# Patient Record
Sex: Male | Born: 1950 | Race: White | Hispanic: No
Health system: Southern US, Academic
[De-identification: ages and names within clinical notes are randomized; demographics above are authoritative.]

## PROBLEM LIST (undated history)

## (undated) ENCOUNTER — Encounter

## (undated) ENCOUNTER — Telehealth

## (undated) ENCOUNTER — Encounter
Attending: Student in an Organized Health Care Education/Training Program | Primary: Student in an Organized Health Care Education/Training Program

## (undated) ENCOUNTER — Ambulatory Visit: Payer: MEDICARE

## (undated) ENCOUNTER — Encounter: Attending: Physical Medicine & Rehabilitation | Primary: Physical Medicine & Rehabilitation

## (undated) ENCOUNTER — Encounter: Attending: Internal Medicine | Primary: Internal Medicine

## (undated) ENCOUNTER — Ambulatory Visit

## (undated) ENCOUNTER — Ambulatory Visit
Payer: MEDICARE | Attending: Rehabilitative and Restorative Service Providers" | Primary: Rehabilitative and Restorative Service Providers"

## (undated) ENCOUNTER — Encounter: Attending: Hematology & Oncology | Primary: Hematology & Oncology

## (undated) ENCOUNTER — Encounter: Attending: Anesthesiology | Primary: Anesthesiology

## (undated) ENCOUNTER — Ambulatory Visit: Payer: MEDICARE | Attending: Physical Medicine & Rehabilitation | Primary: Physical Medicine & Rehabilitation

## (undated) ENCOUNTER — Encounter: Attending: Physician Assistant | Primary: Physician Assistant

## (undated) ENCOUNTER — Ambulatory Visit: Payer: MEDICARE | Attending: Internal Medicine | Primary: Internal Medicine

## (undated) ENCOUNTER — Encounter
Attending: Pharmacist Clinician (PhC)/ Clinical Pharmacy Specialist | Primary: Pharmacist Clinician (PhC)/ Clinical Pharmacy Specialist

## (undated) ENCOUNTER — Telehealth
Attending: Pharmacist Clinician (PhC)/ Clinical Pharmacy Specialist | Primary: Pharmacist Clinician (PhC)/ Clinical Pharmacy Specialist

## (undated) ENCOUNTER — Ambulatory Visit
Payer: MEDICARE | Attending: Student in an Organized Health Care Education/Training Program | Primary: Student in an Organized Health Care Education/Training Program

## (undated) ENCOUNTER — Encounter: Attending: Diagnostic Radiology | Primary: Diagnostic Radiology

## (undated) ENCOUNTER — Encounter: Attending: Surgery | Primary: Surgery

## (undated) ENCOUNTER — Ambulatory Visit
Attending: Student in an Organized Health Care Education/Training Program | Primary: Student in an Organized Health Care Education/Training Program

## (undated) ENCOUNTER — Ambulatory Visit: Payer: MEDICARE | Attending: Plastic and Reconstructive Surgery | Primary: Plastic and Reconstructive Surgery

## (undated) ENCOUNTER — Ambulatory Visit: Attending: Internal Medicine | Primary: Internal Medicine

## (undated) ENCOUNTER — Encounter: Attending: Urology | Primary: Urology

## (undated) ENCOUNTER — Ambulatory Visit: Payer: MEDICARE | Attending: Cardiovascular Disease | Primary: Cardiovascular Disease

## (undated) ENCOUNTER — Ambulatory Visit: Payer: MEDICARE | Attending: Audiologist | Primary: Audiologist

## (undated) ENCOUNTER — Ambulatory Visit: Payer: MEDICARE | Attending: Nurse Practitioner | Primary: Nurse Practitioner

## (undated) ENCOUNTER — Ambulatory Visit: Payer: MEDICARE | Attending: Neurology | Primary: Neurology

## (undated) ENCOUNTER — Telehealth: Attending: Urology | Primary: Urology

## (undated) ENCOUNTER — Encounter: Attending: Dermatology | Primary: Dermatology

## (undated) ENCOUNTER — Encounter: Attending: Adult Health | Primary: Adult Health

## (undated) ENCOUNTER — Encounter: Attending: Infectious Disease | Primary: Infectious Disease

## (undated) DIAGNOSIS — I272 Pulmonary hypertension, unspecified: Secondary | ICD-10-CM

## (undated) DIAGNOSIS — B1920 Unspecified viral hepatitis C without hepatic coma: Secondary | ICD-10-CM

## (undated) DIAGNOSIS — K7460 Unspecified cirrhosis of liver: Secondary | ICD-10-CM

## (undated) DIAGNOSIS — C801 Malignant (primary) neoplasm, unspecified: Secondary | ICD-10-CM

## (undated) DIAGNOSIS — I509 Heart failure, unspecified: Secondary | ICD-10-CM

## (undated) DIAGNOSIS — Z944 Liver transplant status: Secondary | ICD-10-CM

## (undated) DIAGNOSIS — I4891 Unspecified atrial fibrillation: Secondary | ICD-10-CM

## (undated) DIAGNOSIS — N189 Chronic kidney disease, unspecified: Secondary | ICD-10-CM

## (undated) DIAGNOSIS — K219 Gastro-esophageal reflux disease without esophagitis: Secondary | ICD-10-CM

## (undated) DIAGNOSIS — E119 Type 2 diabetes mellitus without complications: Secondary | ICD-10-CM

## (undated) DIAGNOSIS — N400 Enlarged prostate without lower urinary tract symptoms: Secondary | ICD-10-CM

## (undated) HISTORY — DX: Heart failure, unspecified: I50.9

## (undated) HISTORY — DX: Chronic kidney disease, unspecified: N18.9

## (undated) HISTORY — PX: LIVER SURGERY: SHX698

## (undated) HISTORY — DX: Unspecified atrial fibrillation: I48.91

## (undated) HISTORY — DX: Pulmonary hypertension, unspecified: I27.20

## (undated) HISTORY — DX: Type 2 diabetes mellitus without complications: E11.9

## (undated) HISTORY — PX: CARDIAC PACEMAKER PLACEMENT: SHX583

## (undated) HISTORY — PX: BACK SURGERY: SHX140

## (undated) HISTORY — DX: Benign prostatic hyperplasia without lower urinary tract symptoms: N40.0

## (undated) HISTORY — DX: Malignant (primary) neoplasm, unspecified: C80.1

## (undated) HISTORY — PX: CLAVICLE SURGERY: SHX598

## (undated) HISTORY — PX: ANKLE FRACTURE SURGERY: SHX122

## (undated) HISTORY — DX: Liver transplant status: Z94.4

## (undated) HISTORY — DX: Gastro-esophageal reflux disease without esophagitis: K21.9

## (undated) HISTORY — DX: Unspecified viral hepatitis C without hepatic coma: B19.20

## (undated) MED ORDER — AMIODARONE 200 MG TABLET: Freq: Every day | ORAL | 0.00000 days

## (undated) MED ORDER — ATORVASTATIN 20 MG TABLET: Freq: Every day | ORAL | 0.00000 days

---
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
## 2004-09-30 ENCOUNTER — Ambulatory Visit: Payer: Self-pay | Admitting: Gastroenterology

## 2004-10-29 ENCOUNTER — Ambulatory Visit (HOSPITAL_COMMUNITY): Admission: RE | Admit: 2004-10-29 | Discharge: 2004-10-29 | Payer: Self-pay | Admitting: Gastroenterology

## 2005-02-14 ENCOUNTER — Ambulatory Visit: Payer: Self-pay | Admitting: Pain Medicine

## 2005-03-15 ENCOUNTER — Ambulatory Visit: Payer: Self-pay | Admitting: Pain Medicine

## 2005-03-30 ENCOUNTER — Ambulatory Visit: Payer: Self-pay | Admitting: Physician Assistant

## 2005-04-07 ENCOUNTER — Ambulatory Visit: Payer: Self-pay | Admitting: Pain Medicine

## 2005-04-22 ENCOUNTER — Ambulatory Visit: Payer: Self-pay | Admitting: Physician Assistant

## 2005-05-09 ENCOUNTER — Ambulatory Visit: Payer: Self-pay | Admitting: Physician Assistant

## 2005-06-09 ENCOUNTER — Ambulatory Visit: Payer: Self-pay | Admitting: Physician Assistant

## 2005-07-11 ENCOUNTER — Ambulatory Visit: Payer: Self-pay | Admitting: Physician Assistant

## 2005-07-14 ENCOUNTER — Ambulatory Visit: Payer: Self-pay | Admitting: Pain Medicine

## 2005-08-10 ENCOUNTER — Ambulatory Visit: Payer: Self-pay | Admitting: Physician Assistant

## 2005-09-09 ENCOUNTER — Ambulatory Visit: Payer: Self-pay | Admitting: Physician Assistant

## 2005-09-27 ENCOUNTER — Ambulatory Visit: Payer: Self-pay | Admitting: Pain Medicine

## 2005-10-12 ENCOUNTER — Ambulatory Visit: Payer: Self-pay | Admitting: Physician Assistant

## 2005-10-13 ENCOUNTER — Ambulatory Visit: Payer: Self-pay | Admitting: Internal Medicine

## 2005-10-19 ENCOUNTER — Ambulatory Visit: Payer: Self-pay | Admitting: Internal Medicine

## 2005-12-09 ENCOUNTER — Ambulatory Visit: Payer: Self-pay | Admitting: Physician Assistant

## 2006-01-06 ENCOUNTER — Ambulatory Visit: Payer: Self-pay | Admitting: Physician Assistant

## 2006-02-06 ENCOUNTER — Ambulatory Visit: Payer: Self-pay | Admitting: Physician Assistant

## 2006-02-07 ENCOUNTER — Ambulatory Visit: Payer: Self-pay | Admitting: Pain Medicine

## 2006-02-20 ENCOUNTER — Ambulatory Visit: Payer: Self-pay | Admitting: Pain Medicine

## 2006-02-24 ENCOUNTER — Ambulatory Visit: Payer: Self-pay | Admitting: Physician Assistant

## 2006-04-06 ENCOUNTER — Ambulatory Visit: Payer: Self-pay | Admitting: Physician Assistant

## 2006-05-05 ENCOUNTER — Ambulatory Visit: Payer: Self-pay | Admitting: Physician Assistant

## 2006-06-05 ENCOUNTER — Ambulatory Visit: Payer: Self-pay | Admitting: Physician Assistant

## 2006-07-04 ENCOUNTER — Ambulatory Visit: Payer: Self-pay | Admitting: Physician Assistant

## 2006-08-03 ENCOUNTER — Ambulatory Visit: Payer: Self-pay | Admitting: Physician Assistant

## 2006-09-01 ENCOUNTER — Ambulatory Visit: Payer: Self-pay | Admitting: Physician Assistant

## 2006-09-26 ENCOUNTER — Ambulatory Visit: Payer: Self-pay | Admitting: Pain Medicine

## 2006-10-26 ENCOUNTER — Ambulatory Visit: Payer: Self-pay | Admitting: Physician Assistant

## 2006-12-01 ENCOUNTER — Ambulatory Visit: Payer: Self-pay | Admitting: Physician Assistant

## 2007-01-01 ENCOUNTER — Ambulatory Visit: Payer: Self-pay | Admitting: Physician Assistant

## 2007-01-16 ENCOUNTER — Ambulatory Visit: Payer: Self-pay | Admitting: Physician Assistant

## 2007-01-22 ENCOUNTER — Ambulatory Visit: Payer: Self-pay | Admitting: Urology

## 2007-03-01 ENCOUNTER — Ambulatory Visit: Payer: Self-pay | Admitting: Physician Assistant

## 2007-03-20 ENCOUNTER — Ambulatory Visit: Payer: Self-pay | Admitting: Pain Medicine

## 2007-03-30 ENCOUNTER — Ambulatory Visit: Payer: Self-pay | Admitting: Physician Assistant

## 2007-05-03 ENCOUNTER — Ambulatory Visit: Payer: Self-pay | Admitting: Physician Assistant

## 2007-05-31 ENCOUNTER — Ambulatory Visit: Payer: Self-pay | Admitting: Physician Assistant

## 2007-06-27 ENCOUNTER — Ambulatory Visit: Payer: Self-pay | Admitting: Physician Assistant

## 2007-07-03 ENCOUNTER — Encounter: Payer: Self-pay | Admitting: Pain Medicine

## 2007-07-13 ENCOUNTER — Encounter: Payer: Self-pay | Admitting: Pain Medicine

## 2007-07-31 ENCOUNTER — Ambulatory Visit: Payer: Self-pay | Admitting: Physician Assistant

## 2007-08-30 ENCOUNTER — Ambulatory Visit: Payer: Self-pay | Admitting: Physician Assistant

## 2007-09-27 ENCOUNTER — Ambulatory Visit: Payer: Self-pay | Admitting: Physician Assistant

## 2007-10-25 ENCOUNTER — Ambulatory Visit: Payer: Self-pay | Admitting: Physician Assistant

## 2007-11-28 ENCOUNTER — Ambulatory Visit: Payer: Self-pay | Admitting: Physician Assistant

## 2007-12-04 ENCOUNTER — Ambulatory Visit: Payer: Self-pay | Admitting: Pain Medicine

## 2008-01-01 ENCOUNTER — Ambulatory Visit: Payer: Self-pay | Admitting: Physician Assistant

## 2008-01-30 ENCOUNTER — Ambulatory Visit: Payer: Self-pay | Admitting: Physician Assistant

## 2008-02-28 ENCOUNTER — Ambulatory Visit: Payer: Self-pay | Admitting: Physician Assistant

## 2008-03-26 ENCOUNTER — Ambulatory Visit: Payer: Self-pay | Admitting: Physician Assistant

## 2008-04-30 ENCOUNTER — Ambulatory Visit: Payer: Self-pay | Admitting: Physician Assistant

## 2008-05-28 ENCOUNTER — Ambulatory Visit: Payer: Self-pay | Admitting: Physician Assistant

## 2008-06-25 ENCOUNTER — Ambulatory Visit: Payer: Self-pay | Admitting: Physician Assistant

## 2008-07-29 ENCOUNTER — Ambulatory Visit: Payer: Self-pay | Admitting: Physician Assistant

## 2008-08-26 ENCOUNTER — Ambulatory Visit: Payer: Self-pay | Admitting: Physician Assistant

## 2008-09-16 ENCOUNTER — Ambulatory Visit: Payer: Self-pay | Admitting: Pain Medicine

## 2008-10-14 ENCOUNTER — Ambulatory Visit: Payer: Self-pay | Admitting: Physician Assistant

## 2008-10-28 ENCOUNTER — Ambulatory Visit: Payer: Self-pay | Admitting: Pain Medicine

## 2008-11-11 ENCOUNTER — Ambulatory Visit: Payer: Self-pay | Admitting: Physician Assistant

## 2008-12-11 ENCOUNTER — Ambulatory Visit: Payer: Self-pay | Admitting: Physician Assistant

## 2009-01-13 ENCOUNTER — Ambulatory Visit: Payer: Self-pay | Admitting: Physician Assistant

## 2009-02-10 ENCOUNTER — Ambulatory Visit: Payer: Self-pay | Admitting: Physician Assistant

## 2009-03-12 ENCOUNTER — Ambulatory Visit: Payer: Self-pay | Admitting: Physician Assistant

## 2009-04-06 ENCOUNTER — Ambulatory Visit: Payer: Self-pay | Admitting: Pain Medicine

## 2009-05-13 ENCOUNTER — Ambulatory Visit: Payer: Self-pay | Admitting: Pain Medicine

## 2009-06-03 ENCOUNTER — Other Ambulatory Visit: Payer: Self-pay | Admitting: Pain Medicine

## 2009-06-15 ENCOUNTER — Ambulatory Visit: Payer: Self-pay | Admitting: Pain Medicine

## 2009-07-16 ENCOUNTER — Ambulatory Visit: Payer: Self-pay | Admitting: Pain Medicine

## 2009-08-17 ENCOUNTER — Ambulatory Visit: Payer: Self-pay | Admitting: Pain Medicine

## 2010-07-12 ENCOUNTER — Other Ambulatory Visit: Payer: Self-pay | Admitting: Physician Assistant

## 2012-08-21 ENCOUNTER — Ambulatory Visit: Payer: Self-pay | Admitting: Physical Medicine and Rehabilitation

## 2012-08-21 LAB — CREATININE, SERUM
Creatinine: 1.13 mg/dL (ref 0.60–1.30)
EGFR (African American): >60

## 2013-04-05 ENCOUNTER — Ambulatory Visit: Payer: Self-pay

## 2013-05-14 ENCOUNTER — Ambulatory Visit: Payer: Self-pay | Admitting: Urology

## 2013-06-13 ENCOUNTER — Ambulatory Visit: Payer: Self-pay | Admitting: Urology

## 2014-06-18 ENCOUNTER — Ambulatory Visit
Admit: 2014-06-18 | Disposition: A | Discharge status: Home / Self Care | Payer: Self-pay | Attending: Family Medicine | Admitting: Family Medicine

## 2015-01-26 ENCOUNTER — Ambulatory Visit
Admission: EM | Admit: 2015-01-26 | Discharge: 2015-01-26 | Disposition: A | Discharge status: Home / Self Care | Payer: Medicare Other | Attending: Family Medicine | Admitting: Family Medicine

## 2015-01-26 ENCOUNTER — Ambulatory Visit: Payer: Medicare Other

## 2015-01-26 ENCOUNTER — Encounter: Payer: Self-pay | Admitting: Emergency Medicine

## 2015-01-26 DIAGNOSIS — M549 Dorsalgia, unspecified: Secondary | ICD-10-CM | POA: Diagnosis present

## 2015-01-26 DIAGNOSIS — T148 Other injury of unspecified body region: Secondary | ICD-10-CM

## 2015-01-26 DIAGNOSIS — S20229A Contusion of unspecified back wall of thorax, initial encounter: Secondary | ICD-10-CM | POA: Diagnosis not present

## 2015-01-26 DIAGNOSIS — K7460 Unspecified cirrhosis of liver: Secondary | ICD-10-CM | POA: Insufficient documentation

## 2015-01-26 DIAGNOSIS — R05 Cough: Secondary | ICD-10-CM | POA: Diagnosis not present

## 2015-01-26 DIAGNOSIS — R059 Cough, unspecified: Secondary | ICD-10-CM

## 2015-01-26 DIAGNOSIS — M546 Pain in thoracic spine: Secondary | ICD-10-CM | POA: Diagnosis not present

## 2015-01-26 DIAGNOSIS — Z79899 Other long term (current) drug therapy: Secondary | ICD-10-CM | POA: Insufficient documentation

## 2015-01-26 DIAGNOSIS — W11XXXA Fall on and from ladder, initial encounter: Secondary | ICD-10-CM | POA: Insufficient documentation

## 2015-01-26 DIAGNOSIS — T148XXA Other injury of unspecified body region, initial encounter: Secondary | ICD-10-CM

## 2015-01-26 HISTORY — DX: Unspecified cirrhosis of liver: K74.60

## 2015-01-26 LAB — URINALYSIS COMPLETE WITH MICROSCOPIC (ARMC ONLY)
BACTERIA UA: NONE SEEN — AB
BILIRUBIN URINE: NEGATIVE
GLUCOSE, UA: NEGATIVE mg/dL
Ketones, ur: NEGATIVE mg/dL
Leukocytes, UA: NEGATIVE
NITRITE: NEGATIVE
Protein, ur: NEGATIVE mg/dL
SQUAMOUS EPITHELIAL / LPF: NONE SEEN — AB
Specific Gravity, Urine: 1.02 (ref 1.005–1.030)
WBC, UA: NONE SEEN WBC/hpf (ref <3)
pH: 7 (ref 5.0–8.0)

## 2015-01-26 MED ORDER — ACETAMINOPHEN 500 MG PO TABS: 1000.0000 mg | ORAL_TABLET | Freq: Four times a day (QID) | ORAL | Status: AC | PRN

## 2015-01-26 NOTE — ED Notes (Signed)
Patient states that on last Wed he tripped and fell at his home.  Patient c/o mid back pain.

## 2015-01-26 NOTE — Discharge Instructions (Signed)
Pulmonary Contusion A pulmonary contusion is a deep bruise to the tissues of the lung. The lungs bring oxygen into the bloodstream, and they remove carbon dioxide that the body cannot use. A pulmonary contusion causes the lung tissue to swell and bleed into the surrounding area. This interferes with the ability of the lungs to function. You may feel short of breath because you are not getting enough oxygen. CAUSES This condition is usually caused by a chest injury, such as an injury from:  A car crash.  A severe fall, especially from a great height.  Being near an explosion.  A sports injury.  A crush injury, such as from industrial or farming machinery.  A physical assault, especially if struck in the chest with a blunt object.  A penetrating injury. SYMPTOMS Symptoms of this condition include:  Shortness of breath.  Fast breathing.  Fast heart rate.  Bruising of the chest.  Chest pain, especially when taking a deep breath.  Coughing.  Spitting up blood. When the pulmonary contusion is severe, the symptoms may get worse over the first 24-48 hours. More serious symptoms may include:  Blueness of the lips or nail beds.  Sweating.  Feeling faint or dizzy.  Feeling confused.  Fever.  An inability to breathe without assistance or extra oxygen. DIAGNOSIS This condition may be diagnosed with:  A physical exam. During the exam, your health care provider may observe your breathing and check your chest for bruised or swollen areas. People who have a pulmonary contusion need to be checked carefully because they may have other injuries.  Imaging tests, such as:  Chest X-rays. An X-ray might not show evidence of a pulmonary contusion for 1-2 days after the injury, so chest X-rays may be repeated several times.  A CT scan.  MRI.  Pulse oximetry. For this test, a sensor is put on the finger to measure heart rate and the amount of oxygen in the bloodstream.  Arterial  blood gases test. This is a blood test that measures the amount of oxygen, carbon dioxide, and acid in the blood. TREATMENT Treatment for this condition may include:  Pain medicine.  Fluids through an IV tube that is inserted into one of your veins.  Deep breathing exercises. These can help to avoid pneumonia. You may be asked to use a device that is called an incentive spirometer to perform deep breathing exercises.  Oxygen therapy. This may be needed if you have difficulty breathing and have low blood oxygen. In severe cases, you may need to have a tube placed in your throat and a have a machine (ventilator) to help with breathing.  Surgery if a blood vessel continues to bleed uncontrollably or if the lung has been punctured. Initial treatment for this condition is often given in an emergency department. HOME CARE INSTRUCTIONS  Take over-the-counter and prescription medicines only as told by your health care provider. Do not take aspirin for the first few days, because this may increase bruising.  Continue to do deep breathing exercises. Use an incentive spirometer for deep breathing exercises as told by your health care provider.  Return to your normal activities only as told by your health care provider. Talk with your health care provider about:  What activities are safe for you.  When you can return to driving, work, school, and sports.  Keep all follow-up visits as told by your health care provider. This is important. SEEK MEDICAL CARE IF:  You have shaking chills.  You  cough up mucus. SEEK IMMEDIATE MEDICAL CARE IF:  You have difficulty breathing, and it is getting worse.  Your chest pain gets worse.  You cough up blood.  You start to wheeze.  Your lips or nail beds appear blue.  You feel dizzy or feel like you may faint.  You feel confused.  You have a fever.   This information is not intended to replace advice given to you by your health care provider. Make  sure you discuss any questions you have with your health care provider.   Document Released: 12/08/2007 Document Revised: 11/19/2014 Document Reviewed: 04/07/2014 Elsevier Interactive Patient Education 2016 [place]. Chest Contusion A chest contusion is a deep bruise on your chest area. Contusions are the result of an injury that caused bleeding under the skin. A chest contusion may involve bruising of the skin, muscles, or ribs. The contusion may turn blue, purple, or yellow. Minor injuries will give you a painless contusion, but more severe contusions may stay painful and swollen for a few weeks. CAUSES  A contusion is usually caused by a blow, trauma, or direct force to an area of the body. SYMPTOMS   Swelling and redness of the injured area.  Discoloration of the injured area.  Tenderness and soreness of the injured area.  Pain. DIAGNOSIS  The diagnosis can be made by taking a history and performing a physical exam. An X-ray, CT scan, or MRI may be needed to determine if there were any associated injuries, such as broken bones (fractures) or internal injuries. TREATMENT  Often, the best treatment for a chest contusion is resting, icing, and applying cold compresses to the injured area. Deep breathing exercises may be recommended to reduce the risk of pneumonia. Over-the-counter medicines may also be recommended for pain control. HOME CARE INSTRUCTIONS   Put ice on the injured area.  Put ice in a plastic bag.  Place a towel between your skin and the bag.  Leave the ice on for 15-20 minutes, 03-04 times a day.  Only take over-the-counter or prescription medicines as directed by your caregiver. Your caregiver may recommend avoiding anti-inflammatory medicines (aspirin, ibuprofen, and naproxen) for 48 hours because these medicines may increase bruising.  Rest the injured area.  Perform deep-breathing exercises as directed by your caregiver.  Stop smoking if you smoke.  Do  not lift objects over 5 pounds (2.3 kg) for 3 days or longer if recommended by your caregiver. SEEK IMMEDIATE MEDICAL CARE IF:   You have increased bruising or swelling.  You have pain that is getting worse.  You have difficulty breathing.  You have dizziness, weakness, or fainting.  You have blood in your urine or stool.  You cough up or vomit blood.  Your swelling or pain is not relieved with medicines. MAKE SURE YOU:   Understand these instructions.  Will watch your condition.  Will get help right away if you are not doing well or get worse.   This information is not intended to replace advice given to you by your health care provider. Make sure you discuss any questions you have with your health care provider.   Document Released: 11/23/2000 Document Revised: 11/23/2011 Document Reviewed: 08/22/2011 Elsevier Interactive Patient Education [employer].

## 2015-01-27 NOTE — ED Provider Notes (Signed)
CSN: [id number]     Arrival date & time 01/26/15  1331 History   First MD Initiated Contact with Patient 01/26/15 1623     Chief Complaint  Patient presents with  . Fall  . Back Pain   (Consider location/radiation/quality/duration/timing/severity/associated sxs/prior Treatment) HPI Comments: Married caucasian male here for evaluation or rib/back pain fell from ladder last week Wednesday bruised and has productive green cough. Worsening pain lying on his back  Denied sick contacts  Has tried heat and ice without any relief.  Pain with deep breaths Denied being sick, loss of bowel/bladder control, saddle paresthesias, arm/leg weakness, vomiting, headache, LOC, seizures, chest pain (angina)  PMHx diabetes, hypertension, low vitamin D  PSHx broken ankle, back in 3 places--rods placed  FHx broken bones  Patient is a 64 y.o. male presenting with fall and back pain. The history is provided by the patient.  Fall This is a new problem. The current episode started more than 2 days ago. The problem occurs rarely. The problem has not changed since onset.Associated symptoms include chest pain. Pertinent negatives include no abdominal pain, no headaches and no shortness of breath. The symptoms are aggravated by coughing, exertion, standing, bending and walking. Nothing relieves the symptoms. He has tried rest, food, water, a cold compress and a warm compress for the symptoms. The treatment provided no relief.  Back Pain Associated symptoms: chest pain   Associated symptoms: no abdominal pain, no dysuria, no fever, no headaches, no numbness and no weakness     Past Medical History  Diagnosis Date  . Cirrhosis [facility])    Past Surgical History  Procedure Laterality Date  . Ankle fracture surgery Bilateral   . Back surgery     History reviewed. No pertinent family history. Social History  Substance Use Topics  . Smoking status: Never Smoker   . Smokeless tobacco: None  . Alcohol Use: No    Review of  Systems  Constitutional: Negative for fever, chills, diaphoresis, activity change, appetite change, fatigue and unexpected weight change.  HENT: Negative for congestion, dental problem, drooling, ear discharge, ear pain, facial swelling, hearing loss, mouth sores, nosebleeds, postnasal drip, rhinorrhea, sinus pressure, sneezing, sore throat, tinnitus, trouble swallowing and voice change.   Eyes: Negative for photophobia, pain, discharge, redness, itching and visual disturbance.  Respiratory: Positive for cough. Negative for choking, chest tightness, shortness of breath, wheezing and stridor.   Cardiovascular: Positive for chest pain. Negative for palpitations and leg swelling.  Gastrointestinal: Negative for nausea, vomiting, abdominal pain, diarrhea, constipation, blood in stool and abdominal distention.  Endocrine: Negative for cold intolerance and heat intolerance.  Genitourinary: Positive for hematuria and flank pain. Negative for dysuria, discharge, penile swelling, scrotal swelling, enuresis, difficulty urinating, genital sores, penile pain and testicular pain.  Musculoskeletal: Positive for myalgias and back pain. Negative for joint swelling, arthralgias, gait problem, neck pain and neck stiffness.  Skin: Positive for color change. Negative for pallor, rash and wound.  Allergic/Immunologic: Negative for environmental allergies and food allergies.  Neurological: Negative for dizziness, tremors, seizures, syncope, facial asymmetry, speech difficulty, weakness, light-headedness, numbness and headaches.  Hematological: Negative for adenopathy. Does not bruise/bleed easily.  Psychiatric/Behavioral: Positive for sleep disturbance. Negative for confusion and agitation. The patient is not nervous/anxious.     Allergies  Review of patient's allergies indicates no known allergies.  Home Medications   Prior to Admission medications   Medication Sig Start Date End Date Taking? Authorizing Provider    cholecalciferol (VITAMIN D) 1000 UNITS tablet  Take 1,000 Units by mouth daily.   Yes Historical Provider, MD  furosemide (LASIX) 20 MG tablet Take 20 mg by mouth.   Yes Historical Provider, MD  Multiple Vitamin (MULTIVITAMIN) tablet Take 1 tablet by mouth daily.   Yes Historical Provider, MD  spironolactone (ALDACTONE) 25 MG tablet Take 25 mg by mouth daily.   Yes Historical Provider, MD  acetaminophen (TYLENOL) 500 MG tablet Take 2 tablets (1,000 mg total) by mouth every 6 (six) hours as needed for mild pain or moderate pain. 01/26/15 02/01/15  Olen Cordial, NP  ciprofloxacin (CIPRO) 500 MG tablet Take 1 tablet (500 mg total) by mouth 2 (two) times daily. 01/28/15   Olen Cordial, NP   Meds Ordered and Administered this Visit  Medications - No data to display  BP 134/60 mmHg  Pulse 68  Temp(Src) 98.1 F (36.7 C) (Tympanic)  Resp 16  Ht 5\' 9"  (1.753 m)  Wt 183 lb (83.008 kg)  BMI 27.01 kg/m2  SpO2 100% No data found.   Physical Exam  Constitutional: He is oriented to person, place, and time. Vital signs are normal. He appears well-developed and well-nourished. He is active and cooperative.  Non-toxic appearance. He does not have a sickly appearance. He does not appear ill. No distress.  HENT:  Head: Normocephalic and atraumatic.  Right Ear: External ear and ear canal normal. A middle ear effusion is present.  Left Ear: Hearing, external ear and ear canal normal. A middle ear effusion is present.  Nose: Mucosal edema and rhinorrhea present. No nose lacerations, sinus tenderness, nasal deformity, septal deviation or nasal septal hematoma. No epistaxis.  No foreign bodies. Right sinus exhibits no maxillary sinus tenderness and no frontal sinus tenderness. Left sinus exhibits no maxillary sinus tenderness and no frontal sinus tenderness.  Mouth/Throat: Uvula is midline and mucous membranes are normal. Mucous membranes are not pale, not dry and not cyanotic. He does not have  dentures. No oral lesions. No trismus in the jaw. Normal dentition. No dental abscesses, uvula swelling, lacerations or dental caries. Posterior oropharyngeal edema and posterior oropharyngeal erythema present. No oropharyngeal exudate or tonsillar abscesses.  Cobblestoning posterior pharynx; bilateral TMs with air fluid level; bilateral nasal turbinates with edema/erythema  Eyes: Conjunctivae, EOM and lids are normal. Pupils are equal, round, and reactive to light. Right eye exhibits no chemosis, no discharge, no exudate and no hordeolum. No foreign body present in the right eye. Left eye exhibits no chemosis, no discharge, no exudate and no hordeolum. No foreign body present in the left eye. Right conjunctiva is not injected. Right conjunctiva has no hemorrhage. Left conjunctiva is not injected. Left conjunctiva has no hemorrhage. No scleral icterus. Right eye exhibits normal extraocular motion and no nystagmus. Left eye exhibits normal extraocular motion and no nystagmus. Right pupil is round and reactive. Left pupil is round and reactive. Pupils are equal.  Neck: Trachea normal and normal range of motion. Neck supple. No tracheal tenderness, no spinous process tenderness and no muscular tenderness present. No rigidity. No tracheal deviation, no edema, no erythema and normal range of motion present. No thyroid mass and no thyromegaly present.  Cardiovascular: Normal rate, regular rhythm, S1 normal, S2 normal, normal heart sounds and intact distal pulses.  PMI is not displaced.  Exam reveals no gallop and no friction rub.   No murmur heard. Pulmonary/Chest: Effort normal and breath sounds normal. No accessory muscle usage or stridor. No respiratory distress. He has no decreased breath sounds. He has  no wheezes. He has no rhonchi. He has no rales.   He exhibits tenderness.  Negative egophany all fields  Abdominal: Soft. Bowel sounds are normal. He exhibits no shifting dullness, no distension, no pulsatile  liver, no fluid wave, no abdominal bruit, no ascites, no pulsatile midline mass and no mass. There is no hepatosplenomegaly. There is no tenderness. There is no rigidity, no rebound, no guarding, no tenderness at McBurney's point and negative Murphy's sign. Hernia confirmed negative in the ventral area.  Dull to percussion x 4 quads  Musculoskeletal: Normal range of motion. He exhibits no edema.       Right shoulder: Normal.       Left shoulder: Normal.       Right elbow: Normal.      Left elbow: Normal.       Right wrist: Normal.       Left wrist: Normal.       Right hip: Normal.       Left hip: Normal.       Right knee: Normal.       Left knee: Normal.       Cervical back: Normal.       Thoracic back: He exhibits tenderness, bony tenderness and pain.       Lumbar back: Normal.       Back:       Right upper arm: Normal.       Left upper arm: Normal.       Right forearm: Normal.       Left forearm: Normal.       Right hand: Normal.       Left hand: Normal.  Right thoracic ribs and intercostals and paraspinals TTP no spasms noted or edema only ecchmosis right flank/ribs  Lymphadenopathy:       Head (right side): No submental, no submandibular, no tonsillar, no preauricular, no posterior auricular and no occipital adenopathy present.       Head (left side): No submental, no submandibular, no tonsillar, no preauricular, no posterior auricular and no occipital adenopathy present.    He has no cervical adenopathy.       Right cervical: No superficial cervical, no deep cervical and no posterior cervical adenopathy present.      Left cervical: No superficial cervical, no deep cervical and no posterior cervical adenopathy present.  Neurological: He is alert and oriented to person, place, and time. He displays no atrophy and no tremor. No cranial nerve deficit or sensory deficit. He exhibits normal muscle tone. He displays no seizure activity. Coordination and gait normal. GCS eye subscore is  4. GCS verbal subscore is 5. GCS motor subscore is 6.  Reflex Scores:      Patellar reflexes are 2+ on the right side and 2+ on the left side.      Achilles reflexes are 2+ on the right side and 2+ on the left side. Skin: Skin is warm, dry and intact. Bruising and ecchymosis noted. No abrasion, no burn, no laceration, no lesion, no petechiae and no rash noted. He is not diaphoretic. No cyanosis or erythema. No pallor. Nails show no clubbing.  Psychiatric: He has a normal mood and affect. His speech is normal and behavior is normal. Judgment and thought content normal. Cognition and memory are normal.  Nursing note and vitals reviewed.   ED Course  Procedures (including critical care time)  Labs Review Labs Reviewed  URINALYSIS COMPLETEWITH MICROSCOPIC ([place]) - Abnormal; Notable for the following:  APPearance CLOUDY (*)    Hgb urine dipstick TRACE (*)    Bacteria, UA NONE SEEN (*)    Squamous Epithelial / LPF NONE SEEN (*)    All other components within normal limits  URINE CULTURE  16 Nov at 1600 patient notified via telephone proteus mirabalis 20000 on urine culture.  This bacteria can cause urinary stones also.  Sensitivities pending.  Discussed with patient if symptoms not improving to follow up for re-evaluation after 48 hours on antibiotics unless not able to void every 8 hours or gross hematuria follow up immediately. patient symptoms worsening back pain since appt denied fever, chills, nausea, vomiting, headache, dysuria.  will treat with ciprofloxacin discussed possible side effects including hyperglycemia due to interaction of cipro and lasix.  Bactrim and aldactone possible hyperkalemia epocrates recommends avoiding/use alternative to bactrim Rx  cipro 500mg  po BID x 10 days sent to [facility] per patient request  Patient verbalized understanding of information/instructions, agreed with plan of care and had no further questions at this time.  Imaging Review Dg Chest 2  View  01/26/2015  CLINICAL DATA:  Productive cough. Blunt trauma to the right side of the chest in right flank. Bruising. EXAM: CHEST  2 VIEW COMPARISON:  06/18/2014 FINDINGS: Heart size and pulmonary vascularity are normal and the lungs are clear. No effusions. No acute osseous abnormality. Harrington rods in the thoracolumbar spine. IMPRESSION: No acute abnormalities. Electronically Signed   By: Lorriane Shire M.D.   On: 01/26/2015 16:56   1700 discussed chest xray negative for fracture or fluid in lungs or pneumothorax.  definite contusion to back/ribs continue tylenol 1000mg  po QID prn pain, ice, rest, avoid heavy lifting.  Patient notified radiology technician he had had  Hematuria sipping water awaiting urine sample.  Patient given copy of xray report told to follow up if worsening cough, hemoptysis, fever, worsening pain.  1730 discussed urinalysis results with patient no bacteria seen microscopic hematuria could be from fall or another source e.g. Urethritis, prostatitis, kidney stone to follow up for repeat urinalysis with PCM in 1 week sooner if gross hematuria, cloudy urine, changes in smell unable to void every 8 hours.  Patient given copy of report.  Discussed urine culture pending will call with results once available typically 48 hours.  Patient verbalized understanding of information/instructions, agreed with plan of care and had no further questions at this time.   MDM   1. Contusion   2. Cough   3. Right-sided thoracic back pain    Exitcare handout on pulmonary and chest contusion given to patient.   Rest, tylenol 1000mg  po QID prn.  Medications as directed.  Call or return to clinic as needed if these symptoms worsen or fail to improve as anticipated and will consider further imaging.  Patient verbalized agreement and understanding of treatment plan and had no further questions at this time.  P2:  ROM, injury prevention  Bronchitis simple, community acquired, may have started as  viral (probably respiratory syncytial, parainfluenza, influenza, or adenovirus), but now evidence of acute purulent bronchitis with resultant bronchial edema and mucus formation.  Viruses are the most common cause of bronchial inflammation in otherwise healthy adults with acute bronchitis.  The appearance of sputum is not predictive of whether a bacterial infection is present.  Purulent sputum is most often caused by viral infections.  There are a small portion of those caused by non-viral agents being Mycoplamsa pneumonia.  Microscopic examination or C&S of sputum in the healthy  adult with acute bronchitis is generally not helpful (usually negative or normal respiratory flora) other considerations being cough from upper respiratory tract infections, sinusitis or allergic syndromes (mild asthma or viral pneumonia).  Differential Diagnosis:  reactive airway disease (asthma, allergic aspergillosis (eosinophilia), chronic bronchitis, respiratory infection (Sinusitis, Common cold, pneumonia), congestive heart failure, reflux esophagitis, bronchogenic tumor, aspiration syndromes and/or exposure irritants/tobacco smoke.  In this case, there is no evidence of any invasive bacterial illness.  Most likely viral etiology so will hold on antibiotic treatment.  Advise supportive care with rest, encourage fluids, good hygiene and watch for any worsening symptoms.  If they were to develop:  come back to the office or go to the emergency room if after hours. Without high fever, severe dyspnea, lack of physical findings or other risk factors, I will hold on  CBC at this time.  Patient refused Rx for tessalon pearles or narcotic cough medications at this time.  Discussed honey with lemon, humidifier, robitussin or delsym OTC use.  I discussed that approximately 50% of patients with acute bronchitis have a cough that lasts up to three weeks, and 25% for over a month.  Tylenol, one to two tablets every four hours as needed for fever  or myalgias.   No aspirin.  Patient instructed to follow up in one week or sooner if symptoms worsen. Patient verbalized agreement and understanding of treatment plan.  P2:  hand washing and cover coughFor acute pain, rest, and intermittent application of heat (do not sleep on heating pad).  I discussed longer-term treatment plan of PRN PO NSAIDS tylenol 1000mg  po QID prn and I discussed a home back care exercise program with a strengthening and flexibility exercise.  Proper avoidance of heavy lifting discussed.  Consider physical therapy or chiropractic care and radiology if not improving.  Call or return to clinic as needed if these symptoms worsen or fail to improve as anticipated especially leg weakness, loss of bowel/bladder control or saddle paresthesias.   Patient verbalized understanding of instructions/information and agreed with plan of care.  Microscopic hematuria cystitis versus prostatitis versus kidney contusion versus dehydration.  Patient is to push fluids.  Make appt with [facility] for follow up urinalysis in 1-2 weeks.  Will call with urine culture results once available. Call or return to clinic as needed if these symptoms worsen or fail to improve as anticipated.  Exitcare handout on microscopic hematuria given to patient Patient verbalized agreement and understanding of treatment plan and had no further questions at this time. P2:  [place], NP 01/27/15 2039  Olen Cordial, NP 01/28/15 1606

## 2015-01-28 MED ORDER — CIPROFLOXACIN HCL 500 MG PO TABS: 500.0000 mg | ORAL_TABLET | Freq: Two times a day (BID) | ORAL | Status: DC

## 2015-01-29 LAB — URINE CULTURE

## 2015-01-29 NOTE — ED Notes (Signed)
Final report of urine C&S positive for UTI, sensitive to Rx provided day of visit

## 2016-06-27 ENCOUNTER — Ambulatory Visit: Payer: Medicare Other | Admitting: *Deleted

## 2016-06-30 ENCOUNTER — Encounter: Payer: Medicare Other | Attending: Internal Medicine | Admitting: *Deleted

## 2016-06-30 ENCOUNTER — Encounter: Payer: Self-pay | Admitting: *Deleted

## 2016-06-30 VITALS — BP 114/62 | Ht 68.0 in | Wt 159.2 lb

## 2016-06-30 DIAGNOSIS — E119 Type 2 diabetes mellitus without complications: Secondary | ICD-10-CM | POA: Diagnosis not present

## 2016-06-30 DIAGNOSIS — E1165 Type 2 diabetes mellitus with hyperglycemia: Secondary | ICD-10-CM

## 2016-06-30 DIAGNOSIS — Z713 Dietary counseling and surveillance: Secondary | ICD-10-CM | POA: Insufficient documentation

## 2016-06-30 NOTE — Patient Instructions (Addendum)
Check blood sugars 2 x day before breakfast and 2 hrs after supper every day  Exercise: Walk as tolerated  Eat 3 meals day,  1-2  snacks a day Space meals 4-6 hours apart Complete 3 Day Food Record and bring to next appt  Bring blood sugar records or glucometer to the next appointment  Carry fast acting glucose and a snack at all times Rotate injection sites  Return for appointment on: Monday Aug 01, 2016 at 1:30 pm with [facility] (dietitian)

## 2016-06-30 NOTE — Progress Notes (Signed)
Diabetes Self-Management Education  Visit Type: First/Initial  Appt. Start Time: 1340 Appt. End Time: 1610  06/30/2016  Mr. Hector Mueller, identified by name and date of birth, is a 66 y.o. male with a diagnosis of Diabetes: Type 2.   ASSESSMENT  Blood pressure 114/62, height 5\' 8"  (1.727 m), weight 159 lb 3.2 oz (72.2 kg). Body mass index is 24.21 kg/m.      Diabetes Self-Management Education - 06/30/16 1501      Visit Information   Visit Type First/Initial     Initial Visit   Diabetes Type Type 2   Are you currently following a meal plan? No   Are you taking your medications as prescribed? Yes   Date Diagnosed March 2018     Health Coping   How would you rate your overall health? Good     Psychosocial Assessment   Patient Belief/Attitude about Diabetes Other (comment)  "i don't like it"   Self-care barriers None   Self-management support Doctor's office;Family   Other persons present Spouse/SO   Patient Concerns Nutrition/Meal planning;Glycemic Control;Monitoring;Healthy Lifestyle;Problem Solving   Special Needs None   Preferred Learning Style Hands on   Learning Readiness Ready   How often do you need to have someone help you when you read instructions, pamphlets, or other written materials from your doctor or pharmacy? 1 - Never   What is the last grade level you completed in school? 2 years college     Pre-Education Assessment   Patient understands the diabetes disease and treatment process. Needs Instruction   Patient understands incorporating nutritional management into lifestyle. Needs Instruction   Patient undertands incorporating physical activity into lifestyle. Needs Instruction   Patient understands using medications safely. Needs Instruction   Patient understands monitoring blood glucose, interpreting and using results Needs Review   Patient understands prevention, detection, and treatment of acute complications. Needs Instruction   Patient  understands prevention, detection, and treatment of chronic complications. Needs Instruction   Patient understands how to develop strategies to address psychosocial issues. Needs Instruction   Patient understands how to develop strategies to promote health/change behavior. Needs Instruction     Complications   Last HgB A1C per patient/outside source 8.8 %  05/12/16   How often do you check your blood sugar? 1-2 times/day   Fasting Blood glucose range (mg/dL) >200; pt reports all blood sugars range from 200's - 400's   Postprandial Blood glucose range (mg/dL) >200   Have you had a dilated eye exam in the past 12 months? Yes   Have you had a dental exam in the past 12 months? Yes   Are you checking your feet? No     Dietary Intake   Breakfast egg, bacon, cheese; breakfast burrito (frozen)   Lunch ham and cheese sandwich; burger; noodles;    Dinner tenderloin, hamburger steak, peas, corn, salad, potatoes, beans   Snack (evening) peanut butter   Beverage(s) water, unsweetened tea, diet soda     Exercise   Exercise Type ADL's     Patient Education   Previous Diabetes Education No   Disease state  Definition of diabetes, type 1 and 2, and the diagnosis of diabetes   Nutrition management  Role of diet in the treatment of diabetes and the relationship between the three main macronutrients and blood glucose level   Physical activity and exercise  Helped patient identify appropriate exercises in relation to his/her diabetes, diabetes complications and other health issue.   Medications Taught/reviewed insulin  injection, site rotation, insulin storage and needle disposal.;Reviewed patients medication for diabetes, action, purpose, timing of dose and side effects.   Monitoring Purpose and frequency of SMBG.;Taught/discussed recording of test results and interpretation of SMBG.;Identified appropriate SMBG and/or A1C goals.   Acute complications Taught treatment of hypoglycemia - the 15 rule.    Chronic complications Relationship between chronic complications and blood glucose control   Psychosocial adjustment Identified and addressed patients feelings and concerns about diabetes;Role of stress on diabetes     Individualized Goals (developed by patient)   Reducing Risk Improve blood sugars Prevent diabetes complications Lead a healthier lifestyle Become more fit     Outcomes   Expected Outcomes Demonstrated interest in learning. Expect positive outcomes   Future DMSE 4-6 wks      Individualized Plan for Diabetes Self-Management Training:   Learning Objective:  Patient will have a greater understanding of diabetes self-management. Patient education plan is to attend individual and/or group sessions per assessed needs and concerns.   Plan:   Patient Instructions  Check blood sugars 2 x day before breakfast and 2 hrs after supper every day Exercise: Walk as tolerated Eat 3 meals day,  1-2  snacks a day Space meals 4-6 hours apart Complete 3 Day Food Record and bring to next appt Bring blood sugar records or glucometer to the next appointment Carry fast acting glucose and a snack at all times Rotate injection sites Return for appointment on: Monday Aug 01, 2016 at 1:30 pm with Pam (dietitian)  Expected Outcomes:  Demonstrated interest in learning. Expect positive outcomes  Education material provided:  General Meal Planning Guidelines Simple Meal Plan 3 Day Food Record Glucose tablets Symptoms, causes and treatments of Hypoglycemia  If problems or questions, patient to contact team via:  Johny Drilling, [place], [place], CDE [phone]  Future DSME appointment: 4-6 wks  Aug 01, 2016 with the dietitian

## 2016-08-01 ENCOUNTER — Encounter: Payer: Self-pay | Admitting: Dietician

## 2016-08-01 ENCOUNTER — Encounter: Payer: Medicare Other | Attending: Internal Medicine | Admitting: Dietician

## 2016-08-01 VITALS — BP 116/64 | Ht 68.0 in | Wt 157.1 lb

## 2016-08-01 DIAGNOSIS — Z713 Dietary counseling and surveillance: Secondary | ICD-10-CM | POA: Insufficient documentation

## 2016-08-01 DIAGNOSIS — E119 Type 2 diabetes mellitus without complications: Secondary | ICD-10-CM | POA: Insufficient documentation

## 2016-08-01 DIAGNOSIS — E1165 Type 2 diabetes mellitus with hyperglycemia: Secondary | ICD-10-CM

## 2016-08-01 NOTE — Progress Notes (Signed)
Diabetes Self-Management Education  Visit Type:  Follow-up  Appt. Start Time: 1330 Appt. End Time: 1430  08/01/2016  Mr. Hector Mueller, identified by name and date of birth, is a 66 y.o. male with a diagnosis of Diabetes:  .   ASSESSMENT  Blood pressure 116/64, height 5\' 8"  (1.727 m), weight 157 lb 1.6 oz (71.3 kg). Body mass index is 23.89 kg/m.       Diabetes Self-Management Education - 57/32/20 2542      Complications   How often do you check your blood sugar? 1-2 times/day   Fasting Blood glucose range (mg/dL) >200  230s-240s   Postprandial Blood glucose range (mg/dL) >200  310s   Have you had a dilated eye exam in the past 12 months? Yes   Have you had a dental exam in the past 12 months? Yes   Are you checking your feet? Yes   How many days per week are you checking your feet? 3  wife checks daily     Dietary Intake   Breakfast toast, bacon; today 2 eggs. Occasionally oatmeal, doesn't like plain   Lunch sometimes small amount; sandwich and fruit; ribs and green beans and potatoes (boiled), slaw, corn   Snack (afternoon) occasionally nuts, berries   Dinner steak with fries; pork chop or tenderloin; loves fish.    Snack (evening) peanut butter crackers occasionally    Beverage(s) mostly water     Exercise   Exercise Type ADL's     Patient Education   Nutrition management  Other (comment)  nutrition with poor appetite related to liver cancer   Physical activity and exercise  Other (comment)  role of light exercise in promoting appetite and energy     Post-Education Assessment   Patient understands the diabetes disease and treatment process. Needs Review   Patient understands incorporating nutritional management into lifestyle. Needs Review  when appetite improves   Patient undertands incorporating physical activity into lifestyle. Demonstrates understanding / competency   Patient understands using medications safely. Demonstrates understanding / competency   Patient understands monitoring blood glucose, interpreting and using results Demonstrates understanding / competency   Patient understands prevention, detection, and treatment of acute complications. Needs Review   Patient understands prevention, detection, and treatment of chronic complications. Needs Review   Patient understands how to develop strategies to address psychosocial issues. Needs Review   Patient understands how to develop strategies to promote health/change behavior. Demonstrates understanding / competency     Outcomes   Program Status Completed      Learning Objective:  Patient will have a greater understanding of diabetes self-management. Patient education plan is to attend individual and/or group sessions per assessed needs and concerns.  Hector Mueller continues to lose weight gradually, and reports poor appetite. He does eat 3 meals daily and occasional snacks. Instructed him and his wife on strategies for promoting appetite, and advised him to eat small amounts of food often during the day. Discussed snack options; encouraged them to keep snacks as convenient as possible. Discussed beverage options for added nutrition.    Plan:   Patient Instructions   Eat small amounts of food every 3-4 hours; avoid going longer than 5 hours without anything to eat.   Try 1-2 servings of a nutrition or breakfast drink every day: Glucerna, Boost, or Carnation breakfast drink.   Try having foods that are cool or cold, and soft. Often they are easier to eat when you don't have much appetite.  Combine fruit with  a protein food for balanced nutrition -- cheese, nuts, peanut butter.   Keep some snack foods convenient to make it easier to eat; some fruit and nut or crackers on a nearby table.     Expected Outcomes:  Demonstrated interest in learning. Expect positive outcomes  Education material provided: Nutrition During/ After Cancer Treatment; appetite loss, taste changes (CFA)  If  problems or questions, patient to contact team via:  Phone  Future DSME appointment: -  prn

## 2016-08-01 NOTE — Patient Instructions (Signed)
   Eat small amounts of food every 3-4 hours; avoid going longer than 5 hours without anything to eat.   Try 1-2 servings of a nutrition or breakfast drink every day: Glucerna, Boost, or Carnation breakfast drink.   Try having foods that are cool or cold, and soft. Often they are easier to eat when you don't have much appetite.  Combine fruit with a protein food for balanced nutrition -- cheese, nuts, peanut butter.   Keep some snack foods convenient to make it easier to eat; some fruit and nut or crackers on a nearby table.

## 2016-09-14 ENCOUNTER — Inpatient Hospital Stay
Admission: EM | Admit: 2016-09-14 | Discharge: 2016-11-24 | Disposition: A | Discharge status: Rehabilitation Facility | Payer: MEDICARE | Source: Intra-hospital

## 2016-09-14 ENCOUNTER — Inpatient Hospital Stay
Admission: EM | Admit: 2016-09-14 | Discharge: 2016-11-24 | Disposition: A | Discharge status: Rehabilitation Facility | Payer: MEDICARE | Source: Intra-hospital | Attending: Certified Registered" | Admitting: Certified Registered"

## 2016-09-14 ENCOUNTER — Inpatient Hospital Stay
Admission: EM | Admit: 2016-09-14 | Discharge: 2016-11-24 | Disposition: A | Discharge status: Rehabilitation Facility | Source: Intra-hospital

## 2016-09-14 ENCOUNTER — Inpatient Hospital Stay
Admission: EM | Admit: 2016-09-14 | Discharge: 2016-11-24 | Disposition: A | Discharge status: Rehabilitation Facility | Payer: MEDICARE | Source: Intra-hospital | Attending: Anesthesiology | Admitting: Anesthesiology

## 2016-09-14 ENCOUNTER — Ambulatory Visit
Admission: RE | Admit: 2016-09-14 | Discharge: 2016-09-14 | Disposition: A | Discharge status: Home / Self Care | Payer: MEDICARE | Attending: Surgery | Admitting: Surgery

## 2016-09-14 ENCOUNTER — Inpatient Hospital Stay
Admission: EM | Admit: 2016-09-14 | Discharge: 2016-11-24 | Disposition: A | Discharge status: Rehabilitation Facility | Payer: MEDICARE | Source: Intra-hospital | Attending: Student in an Organized Health Care Education/Training Program | Admitting: Student in an Organized Health Care Education/Training Program

## 2016-09-14 DIAGNOSIS — K7469 Other cirrhosis of liver: Principal | ICD-10-CM

## 2016-09-15 DIAGNOSIS — K7469 Other cirrhosis of liver: Principal | ICD-10-CM

## 2016-09-15 DIAGNOSIS — Z7682 Awaiting organ transplant status: Principal | ICD-10-CM

## 2016-09-16 DIAGNOSIS — K7469 Other cirrhosis of liver: Principal | ICD-10-CM

## 2016-09-17 DIAGNOSIS — K7469 Other cirrhosis of liver: Principal | ICD-10-CM

## 2016-09-18 DIAGNOSIS — K7469 Other cirrhosis of liver: Principal | ICD-10-CM

## 2016-09-19 DIAGNOSIS — K7469 Other cirrhosis of liver: Principal | ICD-10-CM

## 2016-09-20 DIAGNOSIS — K7469 Other cirrhosis of liver: Principal | ICD-10-CM

## 2016-09-21 DIAGNOSIS — K7469 Other cirrhosis of liver: Principal | ICD-10-CM

## 2016-09-22 DIAGNOSIS — K7469 Other cirrhosis of liver: Principal | ICD-10-CM

## 2016-09-23 DIAGNOSIS — K7469 Other cirrhosis of liver: Principal | ICD-10-CM

## 2016-09-24 DIAGNOSIS — K7469 Other cirrhosis of liver: Principal | ICD-10-CM

## 2016-09-25 DIAGNOSIS — K7469 Other cirrhosis of liver: Principal | ICD-10-CM

## 2016-09-26 DIAGNOSIS — K7469 Other cirrhosis of liver: Principal | ICD-10-CM

## 2016-09-27 DIAGNOSIS — K7469 Other cirrhosis of liver: Principal | ICD-10-CM

## 2016-09-28 DIAGNOSIS — K7469 Other cirrhosis of liver: Principal | ICD-10-CM

## 2016-09-29 DIAGNOSIS — K7469 Other cirrhosis of liver: Principal | ICD-10-CM

## 2016-09-30 DIAGNOSIS — K7469 Other cirrhosis of liver: Principal | ICD-10-CM

## 2016-10-01 DIAGNOSIS — K7469 Other cirrhosis of liver: Principal | ICD-10-CM

## 2016-10-02 DIAGNOSIS — K7469 Other cirrhosis of liver: Principal | ICD-10-CM

## 2016-10-03 DIAGNOSIS — K7469 Other cirrhosis of liver: Principal | ICD-10-CM

## 2016-10-04 DIAGNOSIS — K7469 Other cirrhosis of liver: Principal | ICD-10-CM

## 2016-10-05 DIAGNOSIS — K7469 Other cirrhosis of liver: Principal | ICD-10-CM

## 2016-10-06 DIAGNOSIS — K7469 Other cirrhosis of liver: Principal | ICD-10-CM

## 2016-10-07 DIAGNOSIS — K7469 Other cirrhosis of liver: Principal | ICD-10-CM

## 2016-10-08 DIAGNOSIS — K7469 Other cirrhosis of liver: Principal | ICD-10-CM

## 2016-10-09 DIAGNOSIS — K7469 Other cirrhosis of liver: Principal | ICD-10-CM

## 2016-10-10 DIAGNOSIS — K7469 Other cirrhosis of liver: Principal | ICD-10-CM

## 2016-10-11 DIAGNOSIS — K7469 Other cirrhosis of liver: Principal | ICD-10-CM

## 2016-10-12 DIAGNOSIS — K7469 Other cirrhosis of liver: Principal | ICD-10-CM

## 2016-10-13 DIAGNOSIS — K7469 Other cirrhosis of liver: Principal | ICD-10-CM

## 2016-10-14 DIAGNOSIS — K7469 Other cirrhosis of liver: Principal | ICD-10-CM

## 2016-10-19 DIAGNOSIS — R001 Bradycardia, unspecified: Principal | ICD-10-CM

## 2016-10-21 DIAGNOSIS — K7469 Other cirrhosis of liver: Principal | ICD-10-CM

## 2016-10-22 DIAGNOSIS — K7469 Other cirrhosis of liver: Principal | ICD-10-CM

## 2016-10-27 DIAGNOSIS — K7469 Other cirrhosis of liver: Principal | ICD-10-CM

## 2016-10-31 DIAGNOSIS — K7469 Other cirrhosis of liver: Principal | ICD-10-CM

## 2016-11-02 DIAGNOSIS — K7469 Other cirrhosis of liver: Principal | ICD-10-CM

## 2016-11-03 DIAGNOSIS — K7469 Other cirrhosis of liver: Principal | ICD-10-CM

## 2016-11-04 DIAGNOSIS — K7469 Other cirrhosis of liver: Principal | ICD-10-CM

## 2016-11-07 DIAGNOSIS — K7469 Other cirrhosis of liver: Principal | ICD-10-CM

## 2016-11-09 MED ORDER — VALGANCICLOVIR 450 MG TABLET: 450 mg | tablet | Freq: Every day | 2 refills | 0 days | Status: AC

## 2016-11-09 MED ORDER — VALGANCICLOVIR 450 MG TABLET
ORAL_TABLET | Freq: Every day | ORAL | 2 refills | 0.00000 days | Status: CP
Start: 2016-11-09 — End: 2016-11-09

## 2016-11-12 DIAGNOSIS — K7469 Other cirrhosis of liver: Principal | ICD-10-CM

## 2016-11-16 LAB — AST (SGOT): Aspartate aminotransferase:CCnc:Pt:Ser/Plas:Qn:: 23

## 2016-11-16 LAB — GAMMA GLUTAMYL TRANSFERASE: Gamma glutamyl transferase:CCnc:Pt:Ser/Plas:Qn:: 180 — ABNORMAL HIGH

## 2016-11-16 LAB — CBC
HEMATOCRIT: 28.1 % — ABNORMAL LOW (ref 41.0–53.0)
HEMOGLOBIN: 9.3 g/dL — ABNORMAL LOW (ref 13.5–17.5)
MEAN CORPUSCULAR HEMOGLOBIN CONC: 32.9 g/dL (ref 31.0–37.0)
MEAN CORPUSCULAR HEMOGLOBIN: 34 pg (ref 26.0–34.0)
MEAN CORPUSCULAR VOLUME: 103.3 fL — ABNORMAL HIGH (ref 80.0–100.0)
MEAN PLATELET VOLUME: 9 fL (ref 7.0–10.0)
PLATELET COUNT: 129 10*9/L — ABNORMAL LOW (ref 150–440)
RED CELL DISTRIBUTION WIDTH: 19 % — ABNORMAL HIGH (ref 12.0–15.0)
WBC ADJUSTED: 3.4 10*9/L — ABNORMAL LOW (ref 4.5–11.0)

## 2016-11-16 LAB — HEPATIC FUNCTION PANEL
ALBUMIN: 2.4 g/dL — ABNORMAL LOW (ref 3.5–5.0)
ALT (SGPT): 63 U/L (ref 19–72)
AST (SGOT): 23 U/L (ref 19–55)
PROTEIN TOTAL: 4.9 g/dL — ABNORMAL LOW (ref 6.5–8.3)

## 2016-11-16 LAB — PHOSPHORUS: Phosphate:MCnc:Pt:Ser/Plas:Qn:: 3.2

## 2016-11-16 LAB — BASIC METABOLIC PANEL
ANION GAP: 6 mmol/L — ABNORMAL LOW (ref 9–15)
BLOOD UREA NITROGEN: 32 mg/dL — ABNORMAL HIGH (ref 7–21)
BUN / CREAT RATIO: 35
CHLORIDE: 100 mmol/L (ref 98–107)
CO2: 31 mmol/L — ABNORMAL HIGH (ref 22.0–30.0)
CREATININE: 0.92 mg/dL (ref 0.70–1.30)
EGFR MDRD AF AMER: >=60 mL/min/{1.73_m2} (ref >=60)
EGFR MDRD NON AF AMER: >=60 mL/min/{1.73_m2} (ref >=60)
POTASSIUM: 3.9 mmol/L (ref 3.5–5.0)
SODIUM: 137 mmol/L (ref 135–145)

## 2016-11-16 LAB — CYCLOSPORINE (FPIA) BLOOD: Lab: 275

## 2016-11-16 LAB — APTT: Coagulation surface induced:Time:Pt:PPP:Qn:Coag: 64.8 — ABNORMAL HIGH

## 2016-11-16 LAB — GLUCOSE RANDOM: Glucose:MCnc:Pt:Ser/Plas:Qn:: 140

## 2016-11-16 LAB — PROTIME: Lab: 19.2 — ABNORMAL HIGH

## 2016-11-16 LAB — RED BLOOD CELL COUNT: Lab: 2.72 — ABNORMAL LOW

## 2016-11-16 LAB — MAGNESIUM: Magnesium:MCnc:Pt:Ser/Plas:Qn:: 1.7

## 2016-11-16 MED ORDER — VALGANCICLOVIR 50 MG/ML ORAL SOLUTION
0 refills | 0 days
Start: 2016-11-16 — End: 2016-11-16

## 2016-11-16 NOTE — Unmapped (Signed)
Problem: Patient Care Overview  Goal: Plan of Care Review  Outcome: Progressing   11/16/16 0327   OTHER   Plan of Care Reviewed With patient   Plan of Care Review   Progress improving     ?? Patient had one episode of shortness of breath at the beginning of the shift  ?? Trach suctioned and atrovent given-pt reported dyspnea improved  ?? Patient coughing up large amounts of white secretions  ?? He reports back pain which is just slightly improved with prn tramadol and a heating pad  ?? VSS, afebrile  ?? Voiding adequately  ?? TF continued  ?? Will continue to monitor

## 2016-11-16 NOTE — Unmapped (Signed)
Met with spouse, patient and sister in law to review transplant education booklet. One hour was spent reviewing material and answering questions.   Learning Readiness: spouse, patient and sister in law acceptance and eager  Method of Instruction: Written instruction - handouts and Verbal instruction    Topics reviewed include: How to contact the [facility] for Transplant Care, Signs and symptoms of infection and rejection to notify your coordinator, Transplant - the gift of life, Dietary restrictions including no grapefruit or grapefruit juice, eating cold foods cold and hot foods hot (the 2 hr rule), no leftovers older than 3 days, and restuarant guidelines, Avoiding infections - Handwashing and no sick contacts, no gardening for the first 3 months, limit diaper changing, discussion about pets, Wound care - daily assessment of the surgical wounds for infection; keep wounds clean and dry, Activity & lifestyles changes; use sun screen to prevent skin cancer, no smoking/ drinking, driving and lifting, Return to sexual activity and STI's, Lab work - frequency, holding IS prior to blood draw, and importance in monitoring for rejection, Medications - importance of taking medications as ordered and introduction to [employer] and IS, Clinic appointments and health maintenance screenings or Keeping a daily log for 6 weeks (or as directed by your coordinator)    The spouse asked appropriate questions and verbalized understanding of the material covered. Outcome: spouse and patient verbalized understanding and reinforcement needed for medication administration (currently pt NPO and plan for G tube placement tomorrow) and dietary teaching (tube feed instruction). Planning for pt to go to [facility] in near future and will reinforce booklet content prior to home discharge.     Patient received discharge bag including Mariea Stable, RN's business card, pill organizer, a water bottle, face masks, urinal, daily logs, Transplant Team contact sheet, and [facility] for [facility]plant Care pin and pen. A second copy of the education material was also given to the patient???s family for review.  Caryl Ada 11/16/2016 12:21 PM

## 2016-11-16 NOTE — Unmapped (Signed)
ASSESSMENT & RECOMMENDATIONS:    66 y.o.??year old male??s/p OLT 09/15/16 with complicated post-op course with afib and bradycardia requiring PPM placement, acute right heart failure and PAH, AKI requiring CRRT, tracheostomy placement, all now improved and stabilized. Planning for PEG 11/17/16.     1. Pulmonary HTN: Estimated PA systolic pressure 80 mmHg on TTE 09/27/16. 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 No further workup or management required per their service. As noted, patient experienced right heart failure following his liver transplant. Patient has had general anesthesia following this echo finding without issue. Goals for anesthetic management are to avoid hypoxia and hypercarbia to avoid worsening of pulmonary HTN. If exacerbated, treatment entities include inodilator therapy, as well as inhaled nitric oxide.    2. A.fib: Controlled with digoxin and amiodarone. Coumadin held for PEG tube placement. Primary team following INR.    3. 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       Perioperative risk assessment:  Perioperative Cardiac Assessment for CAD as 2014 ACC/AHA guidelines.  Active Cardiac Condition: no and denies cardiac signs or symptoms  History of congestive heart failure, History of diabetes requiring preoperative insulin use   Risk for cardiac death, nonfatal myocardial infarction, and nonfatal cardiac arrest:   2: RCRI (High risk patient)  METs equal to or more than 4: yes  Has the patient had cardiac testing in the last 12 months? Yes  Is a Cardiology consult or preoperative stress test necessary: no         HPI:   66 y.o. male  scheduled for PEG tube placement on 11/17/16 evaluated anesthesia consult because of pulmonary HTN. Pre-op Exam       Past Medical History:   Diagnosis Date   ??? Cancer (CMS-HCC)    ??? Cirrhosis (CMS-HCC)    ??? Diabetes (CMS-HCC)    ??? Hepatitis C 07/17/2012   ??? Liver disease    ??? Low back pain 07/17/2012   ??? Varices, esophageal (CMS-HCC) Past Surgical History:   Procedure Laterality Date   ??? ANKLE SURGERY     ??? BACK SURGERY     ??? BACK SURGERY     ??? CHG US GUIDE, TISSUE ABLATION N/A 01/22/2016    Procedure: ULTRASOUND GUIDANCE FOR, AND MONITORING OF, PARENCHYMAL TISSUE ABLATION;  Surgeon: Particia Nearing, MD;  Location: MAIN OR [facility];  Service: Transplant   ??? IR EMBOLIZATION ORGAN ISCHEMIA, TUMORS, INFAR  06/16/2016    IR EMBOLIZATION ORGAN ISCHEMIA, TUMORS, INFAR 06/16/2016 Ammie Dalton, MD IMG VIR H&V [facility]   ??? PR COLON CA SCRN NOT HI RSK IND N/A 02/27/2015    Procedure: COLOREC CNCR SCR;COLNSCPY NO;  Surgeon: Vonda Antigua, MD;  Location: GI PROCEDURES [facility];  Service: Gastroenterology   ??? PR ENDOSCOPIC ULTRASOUND EXAM N/A 02/27/2015    Procedure: UGI ENDO; W/ENDO ULTRASOUND EXAM INCLUDES ESOPHAGUS, STOMACH, &/OR DUODENUM/JEJUNUM;  Surgeon: Vonda Antigua, MD;  Location: GI PROCEDURES [facility];  Service: Gastroenterology   ??? PR INSER HEART TEMP PACER ONE CHMBR N/A 10/02/2016    Procedure: Tempoarary Pacemaker Insertion;  Surgeon: Meredith Leeds, MD;  Location: [facility];  Service: Cardiology   ??? PR LAP,DIAGNOSTIC ABDOMEN N/A 01/22/2016    Procedure: Laparoscopy, Abdomen, Peritoneum, & Omentum, Diagnostic, W/Wo Collection Specimen(S) By Brushing Or Washing;  Surgeon: Particia Nearing, MD;  Location: MAIN OR [facility];  Service: Transplant   ??? PR TRACHEOSTOMY, PLANNED N/A 09/29/2016    Procedure: TRACHEOSTOMY PLANNED (SEPART PROC);  Surgeon:  Katherina Mires, MD;  Location: MAIN OR [facility];  Service: Trauma   ??? PR TRANSCATH INSERT OR REPLACE LEADLESS PM VENTR N/A 10/11/2016    Procedure: Pacemaker Implant/Replace Leadless;  Surgeon: Meredith Leeds, MD;  Location: [place] [facility];  Service: Cardiology   ??? PR TRANSPLANT LIVER,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    Procedure: Liver Allotransplantation; Orthotopic, Partial Or Whole, From Cadaver Or Living Donor, Any Age;  Surgeon: Doyce Loose, MD;  Location: MAIN OR [facility]; Service: Transplant   ??? PR TRANSPLANT,PREP DONOR LIVER, WHOLE N/A 09/15/2016    Procedure: Rogelia Boga Std Prep Cad Donor Whole Liver Gft Prior Tnsplnt,Inc Chole,Diss/Rem Surr Tissu Wo Triseg/Lobe Splt;  Surgeon: Doyce Loose, MD;  Location: MAIN OR [facility];  Service: Transplant   ??? PR UPPER GI ENDOSCOPY,BIOPSY N/A 07/17/2012    Procedure: UGI ENDOSCOPY; WITH BIOPSY, SINGLE OR MULTIPLE;  Surgeon: Alba Destine, MD;  Location: GI PROCEDURES [facility];  Service: Gastroenterology   ??? PR UPPER GI ENDOSCOPY,DIAGNOSIS N/A 02/04/2014    Procedure: UGI ENDO, INCLUDE ESOPHAGUS, STOMACH, & DUODENUM &/OR JEJUNUM; DX W/WO COLLECTION SPECIMN, BY BRUSH OR WASH;  Surgeon: Wilburt Finlay, MD;  Location: GI PROCEDURES [facility]ORIAL [facility];  Service: Gastroenterology   ??? PR UPPER GI ENDOSCOPY,LIGAT VARIX N/A 11/05/2013    Procedure: UGI ENDO; W/BAND LIG ESOPH &/OR GASTRIC VARICES;  Surgeon: Wilburt Finlay, MD;  Location: GI PROCEDURES [facility];  Service: Gastroenterology       Current Facility-Administered Medications   Medication Dose Route Frequency Provider Last Rate Last Dose   ??? acetaminophen (TYLENOL) solution 650 mg  650 mg NG tube Q4H PRN Trellis Paganini [place], [place]   650 mg at 11/16/16 0136   ??? amiodarone (PACERONE) tablet 200 mg  200 mg NG tube Daily Trellis Paganini [place], [place]   200 mg at 11/16/16 0935   ??? amLODIPine (NORVASC) tablet 10 mg  10 mg NG tube Daily Jonell Cluck, MD   10 mg at 11/16/16 0935   ??? cycloSPORINE (NEORAL) microemulsion solution 225 mg  225 mg NG tube BID Lysle Rubens, MD   225 mg at 11/16/16 0538   ??? dextrose 50 % solution 12.5 g  12.5 g Intravenous Q10 Min PRN Jonell Cluck, MD   12.5 g at 10/29/16 1145   ??? digoxin (LANOXIN) tablet 62.5 mcg  62.5 mcg NG tube Daily Merri Brunette, MD   62.5 mcg at 11/16/16 0936   ??? heparin (porcine) 1000 unit/mL injection 5,000 Units  5,000 Units Intravenous Q6H PRN Lysle Rubens, MD       ??? heparin 25,000 Units/250 mL (100 units/mL) infusion  13 Units/kg/hr Intravenous Continuous Lysle Rubens, MD 9.57 mL/hr at 11/16/16 1249 13 Units/kg/hr at 11/16/16 1249   ??? hydrALAZINE (APRESOLINE) injection 10 mg  10 mg Intravenous Q6H PRN Jonell Cluck, MD       ??? insulin NPH (HumuLIN,NovoLIN) injection 8 Units  8 Units Subcutaneous BID Denzil Magnuson, MD   8 Units at 11/16/16 1610   ??? insulin regular (HumuLIN,NovoLIN) injection 0-12 Units  0-12 Units Subcutaneous Q6H [facility] Denzil Magnuson, MD   2 Units at 11/15/16 1821   ??? insulin regular (HumuLIN,NovoLIN) injection 4 Units  4 Units Subcutaneous [facility] Denzil Magnuson, MD   4 Units at 11/16/16 1247   ??? ipratropium (ATROVENT) 0.02 % nebulizer solution 500 mcg  500 mcg Nebulization Q6H PRN Jonell Cluck, MD   500 mcg at 11/15/16 2008   ??? labetalol (NORMODYNE,TRANDATE) injection  20 mg  20 mg Intravenous Q6H PRN Jonell Cluck, MD   20 mg at 11/05/16 1752   ??? lidocaine (LIDODERM) 5 % patch 1 patch  1 patch Transdermal Daily Julio Alm, MD   1 patch at 11/15/16 [phone]   ??? melatonin tablet 3 mg  3 mg NG tube Nightly Trellis Paganini [place], [place]   3 mg at 11/15/16 1950   ??? mycophenolate (CELLCEPT) 200 mg/mL suspension 500 mg  500 mg NG tube BID Trellis Paganini [place], [place]   500 mg at 11/16/16 9604   ??? ocular lubricant (GENTEAL SEVERE) 0.3 % ophthalmic gel 1 drop  1 drop Both Eyes TID PRN Berenda Morale, MD   1 drop at 09/30/16 1619   ??? OLANZapine (ZYPREXA) tablet 1.25 mg  1.25 mg NG tube Daily Merri Brunette, MD   1.25 mg at 11/16/16 0934   ??? OLANZapine (ZYPREXA) tablet 2.5 mg  2.5 mg Oral BID PRN Merri Brunette, MD       ??? OLANZapine (ZYPREXA) tablet 7.5 mg  7.5 mg NG tube Nightly Jonell Cluck, MD   7.5 mg at 11/15/16 1950   ??? omeprazole (PriLOSEC) suspension 20 mg  20 mg Nasogastric Daily Trellis Paganini [place], [place]   20 mg at 11/16/16 0936   ??? ondansetron (ZOFRAN-ODT) disintegrating tablet 4 mg  4 mg NG tube Q6H PRN Paul Dykes, MD       ??? sildenafil (antihypertensive) (REVATIO) 10 mg/mL oral suspension 30 mg  30 mg NG tube [facility] Berenda Morale, MD   30 mg at 11/16/16 1402   ??? sulfamethoxazole-trimethoprim (BACTRIM,SEPTRA) 200-40 mg/5 mL suspension 10 mL  10 mL NG tube Once per day on Mon Wed Fri Trellis Paganini [place], [place]   10 mL at 11/16/16 5409   ??? thiamine (B-1) tablet 100 mg  100 mg Oral Daily Jonell Cluck, MD   100 mg at 11/16/16 0935   ??? traMADol (ULTRAM) tablet 50 mg  50 mg NG tube Q6H PRN Janalyn Shy, PA   50 mg at 11/16/16 1402   ??? valGANciclovir (VALCYTE) 50 mg/mL oral solution 450 mg  450 mg NG tube Daily Doyce Loose, MD   450 mg at 11/16/16 0936       No Known Allergies     PFH:  Coagulopathies:  Denies           Anesthesia problems:  Denies    SH:   History   Smoking Status   ??? Never Smoker   Smokeless Tobacco   ??? Never Used     Comment: Smoked in high school for about 5 years.      ETOH abuse: no  Drug abuse: no      ROS:    10 systems reviewed  10 systems reviewed and negative      PE:  GEN:  Alert, well appearing male , in no acute distress  Temp:  [35.7 ??C-36.3 ??C] 35.8 ??C  Heart Rate:  [69-79] 72  Resp:  [17-18] 17  BP: (106-128)/(55-61) 128/61  MAP (mmHg):  [80-88] 88  FiO2 (%):  [28 %] 28 %  SpO2:  [86 %-99 %] 99 %       Tests:   Most recent EKG:   Results for orders placed during the hospital encounter of 09/14/16   ECG 12 Lead    Narrative NORMAL SINUS RHYTHM  NONSPECIFIC ST AND T WAVE ABNORMALITY  WHEN COMPARED WITH ECG OF 14-Nov-2016 15:34,  NO  SIGNIFICANT CHANGE WAS FOUND  Confirmed by Willa Rough MD, Charles [phone]) on 11/15/2016 4:49:25 PM     Lab Results   Component Value Date    WBC 3.4 (L) 11/16/2016    HGB 9.3 (L) 11/16/2016    HCT 28.1 (L) 11/16/2016    PLT 129 (L) 11/16/2016       Lab Results   Component Value Date    NA 137 11/16/2016    K 3.9 11/16/2016    CL 100 11/16/2016    GLU 140 11/16/2016    CO2 31.0 (H) 11/16/2016    BUN 32 (H) 11/16/2016    CREATININE 0.92 11/16/2016    CALCIUM 8.5 11/16/2016    MG 1.7 11/16/2016    PHOS 3.2 11/16/2016       Lab Results   Component Value Date    ALKPHOS 240 (H) 11/16/2016    BILITOT 1.0 11/16/2016    BILIDIR 0.60 (H) 11/16/2016    PROT 4.9 (L) 11/16/2016    ALBUMIN 2.4 (L) 11/16/2016    ALT 63 11/16/2016    AST 23 11/16/2016    GGT 180 (H) 11/16/2016       Lab Results   Component Value Date    PT 19.2 (H) 11/16/2016    INR 1.68 11/16/2016    APTT 76.8 (H) 11/15/2016     TTE (09/27/16)  ?? Technically difficult study due to chest wall/lung interference  ?? Limited study to evaluate ventricular function  ?? Normal left ventricular systolic function, ejection fraction 60 to 65%  ?? Dilated right ventricle - moderate  ?? Reduced right ventricular systolic function  ?? Tricuspid regurgitation - mild  ?? Elevated pulmonary artery systolic pressure - severe  ?? Dilated right atrium  ?? Pericardial effusion - small    No history of anesthetic complications  ??  Airway   Mallampati: II  TM distance: >3 FB  Jaw Protrusion: normal  Upper Lip Bite Test Class: I above vermillion line  Neck ROM: limited  Mouth Opening: normal and >3 FB  - facial hair  (+) airway adjunct  Comment: 6.0 Shiley cuffed trach in place  Dental      Pulmonary - normal exam  Cardiovascular - normal exam  (+) dysrhythmias (hx of A-fib with RVR in past),     Rhythm: regular  Rate: normal  Pumonary HTN (severe)    Neuro/Psych      Comments: Back pain  GI/Hepatic/Renal    (+) hepatitis C, liver disease (Hep C/ HCC), chronic renal disease, Comments: Cirrhosis w/ hx of encepholopathy and ascites  Hx of Esophageal varices - non bleeding    Kidney stone with hematuria 1 year ago    TACE of segment 7 in April 2018     Endo/Other - negative ROS   (+) diabetes mellitus type 1 using insulin, ,  Abdominal   (-) obese  - no nausea and vomiting    ??  HEENT

## 2016-11-16 NOTE — Unmapped (Signed)
SRF TEACHING ROUNDS    An interdisciplinary care conference was held today and included the following team members:Pablo Matilde Haymaker, MD Transplant Surgeon, SRF Surgery Resident , Cranston Neighbor, RN Transplant Nurse Coordinator, Toy Care, PharmD Transplant Pharmacist, Justice Britain, LCSWA Transplant Social Worker, Lowella Petties, LCSW Transplant Social Worker and Kandis Ban, RD Transplant Dietician.     Per team, pt is awake and responsive. INR is down to 1.68. Pt awaiting peg tomorrow. Creatinine .92. 3.4 white count.     Discharge Plan: No discharge plans at this time.    Robina Ade  Transplant Social Worker/Case Manager  [facility] for Transplant Care

## 2016-11-16 NOTE — Unmapped (Signed)
Problem: Patient Care Overview  Goal: Plan of Care Review  Outcome: Progressing  Patient updated on plan of care at beginning of shift and PRN.  VSS.  Voiding. -BM/+flatus.  Corpak intact, continuous tube feeds running at 29ml/hr.  Up with assistance and a walker, ambulated in room and OOB to chair for most of the morning.  Complaints of back pain, controlled with PRN tramadol.  Will continue to monitor.

## 2016-11-17 DIAGNOSIS — K7469 Other cirrhosis of liver: Principal | ICD-10-CM

## 2016-11-17 LAB — CBC
HEMATOCRIT: 30.6 % — ABNORMAL LOW (ref 41.0–53.0)
HEMATOCRIT: 28.3 % — ABNORMAL LOW (ref 41.0–53.0)
HEMOGLOBIN: 10 g/dL — ABNORMAL LOW (ref 13.5–17.5)
HEMOGLOBIN: 9.4 g/dL — ABNORMAL LOW (ref 13.5–17.5)
MEAN CORPUSCULAR HEMOGLOBIN CONC: 32.8 g/dL (ref 31.0–37.0)
MEAN CORPUSCULAR HEMOGLOBIN CONC: 33.2 g/dL (ref 31.0–37.0)
MEAN CORPUSCULAR HEMOGLOBIN: 33.5 pg (ref 26.0–34.0)
MEAN CORPUSCULAR HEMOGLOBIN: 34.2 pg — ABNORMAL HIGH (ref 26.0–34.0)
MEAN CORPUSCULAR VOLUME: 102.2 fL — ABNORMAL HIGH (ref 80.0–100.0)
MEAN CORPUSCULAR VOLUME: 103 fL — ABNORMAL HIGH (ref 80.0–100.0)
MEAN PLATELET VOLUME: 8.3 fL (ref 7.0–10.0)
PLATELET COUNT: 115 10*9/L — ABNORMAL LOW (ref 150–440)
PLATELET COUNT: 132 10*9/L — ABNORMAL LOW (ref 150–440)
RED BLOOD CELL COUNT: 3 10*12/L — ABNORMAL LOW (ref 4.50–5.90)
RED BLOOD CELL COUNT: 2.74 10*12/L — ABNORMAL LOW (ref 4.50–5.90)
RED CELL DISTRIBUTION WIDTH: 19 % — ABNORMAL HIGH (ref 12.0–15.0)
RED CELL DISTRIBUTION WIDTH: 19.1 % — ABNORMAL HIGH (ref 12.0–15.0)
WBC ADJUSTED: 3.6 10*9/L — ABNORMAL LOW (ref 4.5–11.0)
WBC ADJUSTED: 6.3 10*9/L (ref 4.5–11.0)

## 2016-11-17 LAB — BASIC METABOLIC PANEL
ANION GAP: 4 mmol/L — ABNORMAL LOW (ref 9–15)
BLOOD UREA NITROGEN: 28 mg/dL — ABNORMAL HIGH (ref 7–21)
BUN / CREAT RATIO: 31
CALCIUM: 8.9 mg/dL (ref 8.5–10.2)
CHLORIDE: 102 mmol/L (ref 98–107)
CO2: 32 mmol/L — ABNORMAL HIGH (ref 22.0–30.0)
CREATININE: 0.89 mg/dL (ref 0.70–1.30)
EGFR MDRD AF AMER: >=60 mL/min/{1.73_m2} (ref >=60)
EGFR MDRD NON AF AMER: >=60 mL/min/{1.73_m2} (ref >=60)
GLUCOSE RANDOM: 140 mg/dL (ref 65–179)
SODIUM: 138 mmol/L (ref 135–145)

## 2016-11-17 LAB — ALT (SGPT): Alanine aminotransferase:CCnc:Pt:Ser/Plas:Qn:: 58

## 2016-11-17 LAB — HEPATIC FUNCTION PANEL
ALBUMIN: 2.9 g/dL — ABNORMAL LOW (ref 3.5–5.0)
ALKALINE PHOSPHATASE: 253 U/L — ABNORMAL HIGH (ref 38–126)
AST (SGOT): 27 U/L (ref 19–55)
BILIRUBIN DIRECT: 0.6 mg/dL — ABNORMAL HIGH (ref 0.00–0.40)

## 2016-11-17 LAB — HEPARIN CORRELATION: Lab: 0.2

## 2016-11-17 LAB — CALCIUM: Calcium:MCnc:Pt:Ser/Plas:Qn:: 8.9

## 2016-11-17 LAB — PHOSPHORUS: Phosphate:MCnc:Pt:Ser/Plas:Qn:: 2.9

## 2016-11-17 LAB — PROTIME: Lab: 16.8 — ABNORMAL HIGH

## 2016-11-17 LAB — CYCLOSPORINE (FPIA) BLOOD: Lab: 278

## 2016-11-17 LAB — GAMMA GLUTAMYL TRANSFERASE: Gamma glutamyl transferase:CCnc:Pt:Ser/Plas:Qn:: 193 — ABNORMAL HIGH

## 2016-11-17 LAB — HEMATOCRIT: Lab: 30.6 — ABNORMAL LOW

## 2016-11-17 LAB — MAGNESIUM: Magnesium:MCnc:Pt:Ser/Plas:Qn:: 1.5 — ABNORMAL LOW

## 2016-11-17 LAB — HEMOGLOBIN: Lab: 9.4 — ABNORMAL LOW

## 2016-11-17 MED ORDER — VALGANCICLOVIR 50 MG/ML ORAL SOLUTION
Freq: Every day | GASTROSTOMY | 0 refills | 0.00000 days | Status: CP
Start: 2016-11-17 — End: 2016-11-24

## 2016-11-17 NOTE — Unmapped (Signed)
Transplant Surgery Progress Note    Hospital Day: 64    Assessment:     Jeffrey Ward??is a 66 y.o.??male??with a history of IDDM, GERD, prior perioperative a-fib with RVR and 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 Post-op course complicated by decompensation thought to be secondary to acute right heart failure and AKI requiring CRRT (resolved). 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 Severe PAH diagnosed. Eventually had episodes of a. Fib/tachyarrythmia x3, converted with amiodarone, then with digoxin; Cardiology following closely. On 11/03/16 he had a significant spike in his LFTs suspicious for acute rejection; however liver biopsy from 11/04/16 did not show any evidence of rejection. He is currently working towards [facility].      Subjective/Interval Events:     No acute events overnight. Will keep trach in place and continue capping trials until after PEG placement. Then will consider decannulation. OR tomorrow 9/6 with GI. Heparin gtt restarted, will d/c at 0200 on 9/6    Plan:     Neuro: Cognition/agitation continuing to improve  - NG pain meds, including tramadol    - Previous concern for seizures, CT head negative  - Follows commands   - MRI pending, per Neuro, but cannot obtain with pacer wires in place. Would prefer that pacer stays and defer MRI to later date when patient more stable from cardiac standpoint. Can NOT get MRI for 6 weeks following permanent pacer placement per cardiology.   ??  CV: NSR, no evidence of tachyarrhythmia for >1 week   - Cardiology following as needed, appreciate recs   --Digozin    PO Amiodarone 200 BID  - Acute right heart failure and cardiogenic shock ??- resolved  - Asystolic pauses - now s/p temp pacer placement  ????????????????????????- Permanent pacer 7/31   - EKG 9/4: NSR and normal QTc  ??  Pulm:   - Secretions stable. Currently 63F cuffless trach collar   - will reconsider decannulation post op  - Spoke with RT and will continue to increase time with capping trials each day  -->Will pursue decannulation sometime after PEG placement and recovery.   - Atrovent nebs q6 PRN  - Continue with respiratory therapy   - Pulmonary artery hypertension - on sildenafil, off epoprostenol  - Tracheostomy 7/19 - trach collar trials as tolerated, Trach downsized 7/30 (6 cuff)    FEN/GI: LFTs normalizing  - Recent Liver US: intrahepatic ductal dilation; CT A/P: indeterminate low-density lesion extending along the right portal vein measuring up to 5.4. NTD. Liver Biopsy pathology: negative for rejection  - NPO with ice chips due to aspiration risk  - Continuous TF (Nepro @ 55)  - Mag repletion  - PPI for GI prophylaxis  -CTM daily Hepatic function panel    Renal:   - Cr normalized, AKI resolved.   - Continue free water flushes at 100 q2 hours   - Appreciate nephrology recs    Endo:   - Endocrine following for hyperglycemia and steroid use  - Steroids done, BG improving with more potential for hypoglycemia now  - NPH 8u BID and Regular to 4u q6 hours, continue regular SSI for continuous TF    Heme/ID:  - Bactim/valcyte for ppx    Immunosuppresion: Liver biopsy results reassuring; no evidence of rejection seen  - Prednisone taper done  - Discontinued tacrolimus and swich to cyclosporine - tremors improved   - continue Cellcept/Prednisone   ??  PPx:   - will continue  to hold coumadin to allow INR to drop back down in time for PEG placement by GI later this week  - heparin gtt restarted, will stop at 0200 9/6 for PEG procedure    Psych: Agitation significantly improved; has not pulled his lines or required sitter or restraints in ~1week  - Psychiatry consulted to assist with delirium/nocturnal agitation, appreciate recs  -continue Zyprexa, discontinue gabapentin, decrease thiamine, keep Mg > 2, twice weekly EKG  -Melatonin QHS   - Keep blinds open, encourage therapy participation during day to help regulate sleep/wake cycle  ??  Dispo: Floor status -Continue work with PT/OT and encourage getting OOB. Recommending 5x high intensity when medically stable       Objective:        Vital Signs:  BP 116/59  - Pulse 72  - Temp 35.8 ??C (Oral)  - Resp 18  - Ht 157 cm (5' 1.81)  - Wt 70.1 kg (154 lb 9.6 oz)  - SpO2 93%  - BMI 28.45 kg/m??     Input/Output:  I/O last 3 completed shifts:  In: 3224.3 [I.V.:149.3; NG/GT:3075]  Out: 2500 [Urine:2500]    Physical Exam:    General: Resting in bed with corpak in place. Comfortable, alert and interactive this morning. Feeling good; no acute complaints.  Neuro: Awake, interactive. Able to talk hoarsely with speech valve.   Lungs: Still on trach collar (41F cuffless) at FiO2 28%, non labored breathing. Secretions lessening. On trach collar this morning and during sleep  Abdomen: incision c/d/i. Staples removed. Soft, non-tender.     Ext: WWP, bruising in RUE improving.       Lab Results   Component Value Date    WBC 3.4 (L) 11/16/2016    HGB 9.3 (L) 11/16/2016    HCT 28.1 (L) 11/16/2016    PLT 129 (L) 11/16/2016       Lab Results   Component Value Date    NA 137 11/16/2016    K 3.9 11/16/2016    CL 100 11/16/2016    CO2 31.0 (H) 11/16/2016    BUN 32 (H) 11/16/2016    CREATININE 0.92 11/16/2016    CALCIUM 8.5 11/16/2016    MG 1.7 11/16/2016    PHOS 3.2 11/16/2016     Imaging:  No new imaging      Lysle Rubens MD  [facility]  [id number]

## 2016-11-17 NOTE — Unmapped (Signed)
SRF CASE REVIEW    An interdisciplinary care conference was held today and included the following team members: Aleen Campi, MD Transplant Surgeon, Everlene Balls Transplant Surgeon , SRF Surgery Resident , Salem Senate, RN, MSN Transplant Nurse Coordinator, Toy Care, PharmD Transplant Pharmacist, Justice Britain, LCSWA Transplant Social Worker, Lowella Petties, LCSW Transplant Social Worker, Kandis Ban, RD Transplant Dietician, West Carbo, MD Transplant Nephrologist and Mora Bellman Transplant Psychologist Post-Doc.    Per team, Patient to receive peg today. Plan is for patient to be decannulated.      Discharge Plan: This SW to contact [facility] and for patient to transfer to [facility] next week.      Robina Ade  Transplant Social Worker/Case Manager  [facility] for Transplant Care

## 2016-11-17 NOTE — Unmapped (Signed)
Transplant Surgery Progress Note    Hospital Day: 78    Assessment:     Jeffrey Ward??is a 66 y.o.??male??with a history of IDDM, GERD, prior perioperative a-fib with RVR and 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 Post-op course complicated by decompensation thought to be secondary to acute right heart failure and AKI requiring CRRT (resolved). 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 Severe PAH diagnosed. Eventually had episodes of a. Fib/tachyarrythmia x3, converted with amiodarone, then with digoxin; Cardiology following closely. On 11/03/16 he had a significant spike in his LFTs suspicious for acute rejection; however liver biopsy from 11/04/16 did not show any evidence of rejection. He is currently working towards AIR.      Subjective/Interval Events:     PEG tube placed by GI today. Heparin gtt to be restarted at 1800 and warfarin tomorrow 9/7    Plan:     Neuro: Cognition/agitation continuing to improve  - NG pain meds, including tramadol    - Previous concern for seizures, CT head negative  - Follows commands   - MRI pending, per Neuro, but cannot obtain with pacer wires in place. Would prefer that pacer stays and defer MRI to later date when patient more stable from cardiac standpoint. Can NOT get MRI for 6 weeks following permanent pacer placement per cardiology.   ??  CV: NSR, no evidence of tachyarrhythmia for >1 week   - Cardiology following as needed, appreciate recs   --Digozin    PO Amiodarone 200 BID  - Acute right heart failure and cardiogenic shock ??- resolved  - Asystolic pauses - now s/p temp pacer placement  ????????????????????????- Permanent pacer 7/31   - EKG 9/4: NSR and normal QTc  ??  Pulm:   - Secretions stable. Currently 21F cuffless trach collar   - Airway Care Team paged to assess for decannulation  - Atrovent nebs q6 PRN  - Continue with respiratory therapy   - Pulmonary artery hypertension - on sildenafil, off epoprostenol  - Tracheostomy 7/19 - trach collar trials as tolerated, Trach downsized 7/30 (6 cuff)    FEN/GI: LFTs normalizing  - Recent Liver US: intrahepatic ductal dilation; CT A/P: indeterminate low-density lesion extending along the right portal vein measuring up to 5.4. NTD. Liver Biopsy pathology: negative for rejection  - NPO with ice chips due to aspiration risk  - Mag repletion  - PPI for GI prophylaxis  -CTM daily Hepatic function panel  - water with meds in PEG  - NPO until can restart diet tomorrow   -  start 120cc x1 hour   - increase by 30q4   - 1 can with each feed until at goal    Renal:   - Cr normalized, AKI resolved.   - Continue free water flushes at 100 q2 hours   - Appreciate nephrology recs    Endo:   - Endocrine following for hyperglycemia and steroid use  - Steroids done, BG improving with more potential for hypoglycemia now  - NPH 8u BID and Regular to 4u q6 hours, continue regular SSI for continuous TF    Heme/ID:  - Bactim/valcyte for ppx    Immunosuppresion: Liver biopsy results reassuring; no evidence of rejection seen  - Prednisone taper done  - Discontinued tacrolimus and swich to cyclosporine - tremors improved   - continue Cellcept/Prednisone   ??  PPx:   - will hold heparin gtt until 9/7  - will hold warfarin  until 9/7    Psych: Agitation significantly improved; has not pulled his lines or required sitter or restraints in ~1week  - Psychiatry consulted to assist with delirium/nocturnal agitation, appreciate recs  -continue Zyprexa, discontinue gabapentin, decrease thiamine, keep Mg > 2, twice weekly EKG  -Melatonin QHS   - Keep blinds open, encourage therapy participation during day to help regulate sleep/wake cycle  ??  Dispo: Floor status  -Continue work with PT/OT and encourage getting OOB. Recommending 5x high intensity when medically stable       Objective:        Vital Signs:  BP 139/63  - Pulse 89  - Temp 35.6 ??C (Oral)  - Resp 18  - Ht 157 cm (5' 1.81)  - Wt 70.1 kg (154 lb 8.7 oz)  - SpO2 92%  - BMI 28.44 kg/m??     Input/Output:  I/O last 3 completed shifts:  In: 2930 [I.V.:625; NG/GT:2305]  Out: 2225 [Urine:2225]    Physical Exam:    General: Resting in bed with corpak in place. Comfortable, alert and interactive this morning. Feeling good; no acute complaints.  Neuro: Awake, interactive. Able to talk hoarsely with speech valve.   Lungs: Still on trach collar (35F cuffless) at FiO2 28%, non labored breathing. Secretions lessening. On trach collar this morning and during sleep  Abdomen: incision c/d/i. Staples removed. Soft, non-tender.     Ext: WWP, bruising in RUE improving.       Lab Results   Component Value Date    WBC 3.6 (L) 11/17/2016    HGB 10.0 (L) 11/17/2016    HCT 30.6 (L) 11/17/2016    PLT 115 (L) 11/17/2016       Lab Results   Component Value Date    NA 138 11/17/2016    K 3.9 11/17/2016    CL 102 11/17/2016    CO2 32.0 (H) 11/17/2016    BUN 28 (H) 11/17/2016    CREATININE 0.89 11/17/2016    CALCIUM 8.9 11/17/2016    MG 1.5 (L) 11/17/2016    PHOS 2.9 11/17/2016     Imaging:  No new imaging      Lysle Rubens MD  [facility]  [phone]

## 2016-11-17 NOTE — Unmapped (Signed)
Problem: Patient Care Overview  Goal: Plan of Care Review  Outcome: Progressing  POC discussed with patient. Pain controlled with prn medications. No falls noted during my shift thus far and I will continue to monitor patient.  Adequate urine output noted. BM tonight.  Heparin drip to be stopped at 0200 and tube feedings stopped at midnight.   Goal: Individualization and Mutuality  Outcome: Progressing    Goal: Discharge Needs Assessment  Outcome: Progressing    Goal: Interprofessional Rounds/Family Conf  Outcome: Progressing

## 2016-11-18 LAB — CBC
HEMATOCRIT: 24.2 % — ABNORMAL LOW (ref 41.0–53.0)
HEMATOCRIT: 26.6 % — ABNORMAL LOW (ref 41.0–53.0)
HEMOGLOBIN: 7.9 g/dL — ABNORMAL LOW (ref 13.5–17.5)
HEMOGLOBIN: 8.6 g/dL — ABNORMAL LOW (ref 13.5–17.5)
MEAN CORPUSCULAR HEMOGLOBIN CONC: 32.5 g/dL (ref 31.0–37.0)
MEAN CORPUSCULAR HEMOGLOBIN CONC: 32.4 g/dL (ref 31.0–37.0)
MEAN CORPUSCULAR HEMOGLOBIN: 33.6 pg (ref 26.0–34.0)
MEAN CORPUSCULAR HEMOGLOBIN: 33.9 pg (ref 26.0–34.0)
MEAN CORPUSCULAR VOLUME: 103.3 fL — ABNORMAL HIGH (ref 80.0–100.0)
MEAN CORPUSCULAR VOLUME: 104.8 fL — ABNORMAL HIGH (ref 80.0–100.0)
MEAN PLATELET VOLUME: 8.9 fL (ref 7.0–10.0)
PLATELET COUNT: 124 10*9/L — ABNORMAL LOW (ref 150–440)
PLATELET COUNT: 133 10*9/L — ABNORMAL LOW (ref 150–440)
RED BLOOD CELL COUNT: 2.34 10*12/L — ABNORMAL LOW (ref 4.50–5.90)
RED BLOOD CELL COUNT: 2.54 10*12/L — ABNORMAL LOW (ref 4.50–5.90)
RED CELL DISTRIBUTION WIDTH: 19.4 % — ABNORMAL HIGH (ref 12.0–15.0)
WBC ADJUSTED: 4.2 10*9/L — ABNORMAL LOW (ref 4.5–11.0)
WBC ADJUSTED: 3.9 10*9/L — ABNORMAL LOW (ref 4.5–11.0)

## 2016-11-18 LAB — BASIC METABOLIC PANEL
ANION GAP: 3 mmol/L — ABNORMAL LOW (ref 9–15)
BLOOD UREA NITROGEN: 21 mg/dL (ref 7–21)
BUN / CREAT RATIO: 25
CALCIUM: 8.2 mg/dL — ABNORMAL LOW (ref 8.5–10.2)
CHLORIDE: 110 mmol/L — ABNORMAL HIGH (ref 98–107)
CO2: 30 mmol/L (ref 22.0–30.0)
CREATININE: 0.85 mg/dL (ref 0.70–1.30)
EGFR MDRD AF AMER: >=60 mL/min/{1.73_m2} (ref >=60)
GLUCOSE RANDOM: 61 mg/dL — ABNORMAL LOW (ref 65–179)
POTASSIUM: 3.7 mmol/L (ref 3.5–5.0)
SODIUM: 143 mmol/L (ref 135–145)

## 2016-11-18 LAB — PHOSPHORUS: Phosphate:MCnc:Pt:Ser/Plas:Qn:: 2.6 — ABNORMAL LOW

## 2016-11-18 LAB — HEPARIN CORRELATION: Lab: 0.9

## 2016-11-18 LAB — RED CELL DISTRIBUTION WIDTH: Lab: 19.4 — ABNORMAL HIGH

## 2016-11-18 LAB — CYCLOSPORINE (FPIA) BLOOD: Lab: 221

## 2016-11-18 LAB — HEPATIC FUNCTION PANEL
ALBUMIN: 2.3 g/dL — ABNORMAL LOW (ref 3.5–5.0)
ALKALINE PHOSPHATASE: 338 U/L — ABNORMAL HIGH (ref 38–126)
AST (SGOT): 95 U/L — ABNORMAL HIGH (ref 19–55)
BILIRUBIN DIRECT: 0.7 mg/dL — ABNORMAL HIGH (ref 0.00–0.40)
BILIRUBIN TOTAL: 1.3 mg/dL — ABNORMAL HIGH (ref 0.0–1.2)

## 2016-11-18 LAB — RED BLOOD CELL COUNT: Lab: 2.34 — ABNORMAL LOW

## 2016-11-18 LAB — PROTIME-INR: INR: 1.48

## 2016-11-18 LAB — INR: Lab: 1.48

## 2016-11-18 LAB — MAGNESIUM: Magnesium:MCnc:Pt:Ser/Plas:Qn:: 1.4 — ABNORMAL LOW

## 2016-11-18 LAB — GAMMA GLUTAMYL TRANSFERASE: Gamma glutamyl transferase:CCnc:Pt:Ser/Plas:Qn:: 282 — ABNORMAL HIGH

## 2016-11-18 LAB — CALCIUM: Calcium:MCnc:Pt:Ser/Plas:Qn:: 8.2 — ABNORMAL LOW

## 2016-11-18 LAB — AST (SGOT): Aspartate aminotransferase:CCnc:Pt:Ser/Plas:Qn:: 95 — ABNORMAL HIGH

## 2016-11-18 NOTE — Unmapped (Signed)
Patient decannulated without complication tolerated well Stoma dressing applied over stoma 95 sat on room air.

## 2016-11-18 NOTE — Unmapped (Signed)
[facility]   Psychiatry Follow-up Consult Note    Service Date: November 18, 2016  LOS:  LOS: 64 days     Assessment:    Jeffrey Ward is a 66 y.o. male admitted 09/14/2016  9:40 PM for liver transplant (now s/p OLT on 09/15/16).  Psychiatry was consulted by Doyce Loose with Surg Transplant [facility]) for Altered Mental Status/Delirium and Aggression/Agitation. Today, Jeffrey Ward is being seen for consideration of Zyprexa taper in the setting of resolving delirium.    At this time, the patient's presentation continues to be consistent with resolving delirium. Recommend stopping standing morning Zyprexa dose, but maintaining standing nighttime dose given subjective report of impaired sleep and recent acute delirium.     Safety Assessment:   Given resolving delirium, pt could resonably be expected to have reduced safety awareness, increased impulsivity and may therefore be at elevated risk of self-harm.    Diagnoses:   Active Hospital problems:  Principal Problem:    S/P liver transplant (CMS-HCC)  Active Problems:    Atrial fibrillation with RVR (CMS-HCC)    Pulmonary hypertension, moderate to severe (CMS-HCC)    Acute right heart failure (CMS-HCC)    Tracheostomy dependent (CMS-HCC)    Mixed level of activity delirium due to multiple etiologies, persistent      Recommendations:   -- Safety Recommendations: Given acute delirium, pt could resonably be expected to have reduced safety awareness, increased impulsivity and may therefore be at elevated risk of self-harm.     -- Medication Recommendations:    Stop a.m. Zyprexa 1.25 mg dose   Continue Zyprexa 7.5 mg po qhs  Continue Zyprexa 2.5 mg po bid prn insomnia, confusion, anxiety/agitation  Continue thiamine 100 mg po daily   Recommend multivitamin and folate 1 mg po daily    -- Medical Decision Making Capacity: Although not formally assessed, given the alteration in mental status, it is recommended that there be communication with the patient's surrogate decision-maker for complex medical decisions.     -- Further Work-up:   Please replete magnesium today  While patient is receiving antipsychotics that may prolong QTc, KEEP Mg>2 and K>4 to minimize risk of torsades  MONITOR QTc.  Please order twice weekly EKGs (or print tele strip) while patient is being actively managed with antipsychotic medications.    -- Disposition: Currently deferred. A more thorough evaluation of disposition will need to occur as the patient's delirium resolves.    -- Behavioral Recommendations: Recommend delirium precautions/protocol. Encourage pt's wife to bring pt's glasses to bedside.     Thank you for this consult request. Recommendations have been communicated to the primary team.  We will follow as needed at this time. Please page [phone] or [phone] (after hours/weekends) for any questions or concerns.     Discussed with and seen by Attending Breck Coons, MD, who agrees with the assessment and plan.     Delila Spence, MD      I saw and evaluated the patient, participating in the key portions of the service.  I reviewed the resident???s note.  I agree with the resident???s findings and plan.     Cloria Spring, MD      Interval History   Updates of hospital course since last seen by psychiatry: Received PEG yesterday. Airway Care team to assess for possible trach decannulation.     Patient Interview: Pt was asleep on approach but able to awaken with verbal stimuli. Passy-Muir valve unable to be located at  the bedside, so communication primarily occurred  Through lip reading / head nods. Jeffrey Ward acknowledged having a difficult night due to bleeding from his  PEG tube. Other than back pain, however, he reports feeling better overall. He is still noting impaired sleep due to back pain. He mentions seeing double, but denies any frank AVH. He is alert and oriented to self, place, year, and month, though not day. He easily passes SAVEAHAART and rolls his eyes jokingly when asked to participate, but does so readily. He denies SI. He feels like he is generally treated well in the hospital.     ROS:   Gen: positive for Back pain    Current Medications:  Scheduled Meds:  ??? amiodarone  200 mg PEG Tube Daily   ??? amLODIPine  10 mg PEG Tube Daily   ??? cycloSPORINE  200 mg PEG Tube BID   ??? digoxin  62.5 mcg PEG Tube Daily   ??? insulin NPH  8 Units Subcutaneous BID   ??? insulin regular  0-12 Units Subcutaneous Q6H [facility]   ??? insulin regular  4 Units Subcutaneous Q6H [facility]   ??? lidocaine  1 patch Transdermal Daily   ??? melatonin  3 mg PEG Tube Nightly   ??? mycophenolate  500 mg NG tube BID   ??? OLANZapine  1.25 mg PEG Tube Daily   ??? OLANZapine  7.5 mg PEG Tube Nightly   ??? omeprazole  20 mg PEG Tube Daily   ??? sildenafil (antihypertensive)  30 mg PEG Tube [facility]   ??? sulfamethoxazole-trimethoprim  10 mL PEG Tube Once per day on Mon Wed Fri   ??? thiamine  100 mg PEG Tube Daily   ??? valGANciclovir  450 mg PEG Tube Daily     Continuous Infusions:  ??? sodium chloride Stopped (11/17/16 0740)     PRN Meds:.acetaminophen, dextrose 50 % in water (D50W), hydrALAZINE, ipratropium, labetalol, ocular lubricant, OLANZapine, ondansetron, traMADol      Objective   Vitals:  Temp:  [35.6 ??C-37.1 ??C] 36.8 ??C  Heart Rate:  [74-91] 76  SpO2 Pulse:  [86-87] 87  Resp:  [13-25] 18  BP: (113-159)/(56-104) 135/61  MAP (mmHg):  [82-114] 89  FiO2 (%):  [28 %] 28 %  SpO2:  [77 %-100 %] 96 %    GEN: no acute distress  MSK: no muscular rigidity in b/l forearms     Mental Status Exam:  Appearance:    Appears stated age, trach in place    Motor:   No abnormal movements   Speech/Language:    Able to mouth responses    Mood:   Pt shrugged when asked    Affect:   Calm, relatively euthymic, if constricted a bit   Thought process:   Logical, linear, clear, coherent, goal directed   Thought content:     Denies SI. No overt delusions or paranoia noted.    Perceptual disturbances:     Denies AVH. Does not appear to respond to internal stimuli. Orientation:   Oriented to person, place, year, month, and general circumstances   Attention:  Passes SAVEAHAART. Further testing deferred due to lack of speaking valve.   Concentration:   Able to attend.    Memory:   Immediate, short-term, long-term, and recall grossly intact    Fund of knowledge:    Not formally tested.   Insight:     Fair   Judgment:    Impaired in the setting of resolving delirium   Impulse Control:  Impaired in the setting of resolving delirium      Data Reviewed:  I reviewed labs from the last 24 hours.

## 2016-11-18 NOTE — Unmapped (Signed)
SRF TEACHING ROUNDS    An interdisciplinary care conference was held today and included the following team members: Aleen Campi, MD Transplant Surgeon, SRF Surgery Resident , Cranston Neighbor, RN Transplant Nurse Coordinator, Berna Bue, PharmD Pharmacist, Justice Britain, LCSWA Transplant Social Worker, Thomasene Mohair, LCSWA Transplant Social Worker and Medical Student.     Per team, patient needs trach removed. INR is at 1.48. Patient to receive CBC. Patient is on triple tube feeds.     Discharge Plan: Patient to be considered for AIR early next week. This SW placed a call yesterday 9/6 to let them know that he may be arriving next week.       Robina Ade  Transplant Social Worker/Case Manager  [facility] for Transplant Care

## 2016-11-18 NOTE — Unmapped (Signed)
Order was placed for PIV by Venous Access Team (VAT).  Patient was assessed for placement of a PIV. Access was obtained. Blood return noted.  Dressing intact and device well secured.  Flushed with normal saline.  Pt advised to inform RN of any s/s of discomfort at the PIV site.    Workup / Procedure Time:  30 minutes      The primary RN was notified.       Thank you,     Lyn Records RN Venous Access Team

## 2016-11-18 NOTE — Unmapped (Signed)
Adult Nutrition Follow Up    Pt is a 66 yo male s/p liver transplant 7/5    ASSESSMENT  Events since Last RD Visit: Pt remains on 5WST w/ plan for possible decannulation. S/p PEG placement yesterday - bled afterwards per RN notes, two stitches placed by MD and bleeding subsequently stopped. Restarted on TFs of Nepro @ 20 mL/hr via PEG. Once deemed safe/medically appropriate, suggest initiation of bolus feeds as noted below. Plan for eventual d/c to AIR.    Labs: Reviewed; Mg 1.4, Phos 2.3, glucose POC 83-177 x 24 hrs  Meds: Reviewed; insulin regimen, thiamine  Abd/GI: last BM 9/07 x 1  Skin: see below  Patient Lines/Drains/Airways Status    Active Wounds     None                   Nutrition Orders            Start     Ordered    11/17/16 1328  NPO Strict; Operating room; Stop Tube Feeding  Effective midnight     Comments:  Water with meds in PEG   Question Answer Comment   NPO Except: Strict    Reason: Operating room    If pt on tube feeding (i.e., Adult Enteral, Pediatric Enteral, Infant Formula & Human Milk) Stop Tube Feeding        11/17/16 1327    11/10/16 1448  Adult Enteral Nutrition Reduced Lytes/Fluid (nepro)  (Adult Enteral Nutrition + Nutrition Consult Panel)  Effective now     Question Answer Comment   Enteral Nutrition Formula: Reduced Lytes/Fluid nepro   Feeding Route: NG Tube    Enteral Nutrition Continuous initial rate (mL/hr): 55    Final Goal (mL/hr) 55    Feeding Tube Flush (mL) 100 mL    Feeding Tube Flush Type: Water    Feeding Tube Flush Frequency: Every 2 hours        11/10/16 1447        Anthropometric Data:  -- Height: 157 cm (5' 1.81)   -- Last recorded weight: 70.1 kg (154 lb 8.7 oz)  -- Admission weight: 76.7 kg  -- UBW: 155 lbs (70.4 kg)  -- IBW: 53.1 kg  -- Percent IBW: ~105%  -- AdjIBW: N/A  -- BMI: Body mass index is 28.44 kg/m??.   -- Weight changes this admission: +2.5L since 11/04/16 - down from UBW ~155 lbs   Last 5 Recorded Weights    11/12/16 0828 11/13/16 0926 11/15/16 0812 11/16/16 1117   Weight: 70.1 kg (154 lb 9.6 oz) 71.5 kg (157 lb 10.1 oz) 73.6 kg (162 lb 4.8 oz) 70.1 kg (154 lb 9.6 oz)    11/17/16 0826   Weight: 70.1 kg (154 lb 8.7 oz)      -- Weight history PTA: per EPIC wt hx, pt has experienced ~8.4% wt loss x ~6 months. Wife reports loss some was intentional after changing dietary behaviors.  Wt Readings from Last 10 Encounters:   11/17/16 70.1 kg (154 lb 8.7 oz)   10/08/16 73.5 kg (162 lb 0.6 oz)   09/06/16 75.2 kg (165 lb 12.6 oz)   08/02/16 71.1 kg (156 lb 12.8 oz)   07/27/16 70.2 kg (154 lb 11.2 oz)   07/05/16 71.7 kg (158 lb)   05/31/16 76 kg (167 lb 8 oz)   05/26/16 76.2 kg (167 lb 15.9 oz)   05/26/16 75.3 kg (166 lb)   04/08/16 77.8 kg (171 lb 8 oz)  Nutrition Focused Physical Exam:  Fat Areas Examined  Orbital: Mild/moderate loss  Upper Arm: Mild/moderate loss      Muscle Areas Examined  Temple: Severe loss  Clavicle: Severe loss  Acromion: Severe loss  Scapular: Severe loss  Anterior Thigh: Mild/moderate loss  Posterior Calf: Mild/moderate loss              Nutrition Evaluation  Overall Impressions: Moderate fat loss;Severe muscle loss (10/25/16 1503)    Estimated Nutrition Needs:   2097-2415 kcals/day (30-35 kcal/kg IBW) compared to 28-32 kcal/kg actual BW  82-103 g protein/day (1.2-1.5 g/kg IBW)  Fluids per MD    Nutrition Assessment: pt continues to benefit from TFs to meet ~100% of needs given NPO status. Phos low today; suspect related to TFs being held for PEG placement. Suggest slow initiation of bolus feeds as noted below.    Patient would benefit from nutrition education on food safety post-transplant.      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 (10/25/16 1508)        Fat Loss: Moderate  Muscle Loss: Severe             Nutrition Diagnosis:   Inadequate oral intake related to peri-op period as evidenced by NPO - ongoing  Food and nutrition-related knowledge deficit as related to new liver transplant as evidenced by need for nutrition education prior to discharge - ongoing    Nutrition Goals: diet advancement - not met, basic understanding of nutrition education topic principle of food safety post liver transplant - not met, tube feeds at goal w/in 1 week of ICU stay - met, weight maintenance - ongoing    Nutrition Action/Recommendations/Interventions:   1. Monitor NPO status, nutrition therapy advancement and tolerance  2. Initiate bolus feeds of Reduced Lytes/Fluid (Nepro) once deemed safe/medically appropriate  --- start with 120 mL, run over pump x 1 hr   --- increase bolus by 30 mL q4 hrs   --- final goal: 1 can (237 mL) 5x daily (6a, 10a, 2p, 6p, 10p)   At goal, order will provide 1185 mL, 2133 kcal, 96 g PRO, 190 g CHO,  865 mL free water  --- additional 100 mL FWF q2hrs or before and after each bolus to provide 1865 mL free water  3. Will educate pt and caregiver on food safety prior to discharge  4. Request twice weekly weights    Follow-up: 1-2 times per week (and more frequent as indicated)     Jackqulyn Livings MPH, RD, LDN  Pager: [phone]

## 2016-11-18 NOTE — Unmapped (Signed)
Procedure performed today in GI Procedures. Please see full documentation and findings under the Procedures tab. In order to view endoscopy images please see operative report from today under the Media tab.     Karma Ganja, MD  Gastroenterology Fellow  [facility]

## 2016-11-18 NOTE — Unmapped (Signed)
Endocrinology Consult Note    Requesting Attending Physician :  Particia Nearing, MD  Service Requesting Consult : Surg Transplant [phone])  Primary Care Provider: [name], MD  Outpatient Endocrinologist: N/A    Assessment/Recommendations:    66 yo male with h/o DM2 who s/p liver transplant. Endo consulted for management of DM in the setting of recent initiation of prednisone and tacrolimus and now on 14h tube feeds.     Patient with DM II complicated by tube feeds and steroids.  TF continuous.      *No new recs despite hypoglycemia. Pt received dose of regular insulin yesterday but then TF held given bleeding at site of PEG. Otherwise BG doing well. In the future if insulin to cover TF is given but TF held, consider dextrose infusion to avoid hypoglycemia.    - continue NPH to 8u BID (do not hold if NPO)  - Continue regular insulin 4u Q6hrs (hold if TF stopped or held)  - Continue STANDARD regular insulin Q6hrs.      - If going to stop tube feeds suddenly, start D10 at 100cc/hr for at least 6 hours to prevent hypoglycemia from regular insulin.    -Advise of any changes to steroid dose or to diet    Potential for amio induced thyroid dysfunction  Amio started 8/14  -Baseline TSH checked: 4.0  -Check TFT q6 months      Thank you for the consult. We will continue to follow patient as needed. Please call/page [phone] with questions or concerns.         History of Present Illness: :    Reason for Consult:   DM2, exacerbated by steroids and calcineurin inhibitor    Jeffrey Ward is a 66 y.o. male w/ h/o DM2, Afib, pulmonary HTN, HCV cirrhosis admitted for S/P liver transplant (CMS-HCC) on 7/5. I have been asked to evaluate June for DM management. Post-op course was complicated by volume overload thought to be due to CHF exacerbation, respiratory failure now trached and on vent, and AKI requiring CRRT. A1c was 8.0 prior to surgery. He is on prednisone 2.5 mg qd since surgery, along with tacrolimus. He was transitioned from insulin gtt to sq POD1, while still NPO. NPH 15u BID achieved BG 130-200 at that time. However, once he started tube feeds, glucoses rose to 300s so he was switched back to insulin gtt on 7/18. He is currently on continuous tube feeds at goal rate, comprising 324 gm CHO per day.     Interval history: Hypoglycemic yesterday but its documented he received nutritional dose as well but then TF held for bleeding at PEG site.    All Medications:     Current Facility-Administered Medications   Medication Dose Route Frequency Provider Last Rate Last Dose   ??? acetaminophen (TYLENOL) solution 650 mg  650 mg PEG Tube Q4H PRN Jeffrey Rubens, MD   650 mg at 11/17/16 2031   ??? amiodarone (PACERONE) tablet 200 mg  200 mg PEG Tube Daily Jeffrey Rubens, MD   200 mg at 11/18/16 0957   ??? amLODIPine (NORVASC) tablet 10 mg  10 mg PEG Tube Daily Jeffrey Rubens, MD   10 mg at 11/18/16 0981   ??? cycloSPORINE (NEORAL) microemulsion solution 200 mg  200 mg PEG Tube BID Jeffrey Rubens, MD   200 mg at 11/18/16 1914   ??? dextrose 50 % solution 12.5 g  12.5 g Intravenous Q10 Min PRN Jeffrey Cluck, MD   12.5 g at  10/29/16 1145   ??? digoxin (LANOXIN) tablet 62.5 mcg  62.5 mcg PEG Tube Daily Jeffrey Rubens, MD   62.5 mcg at 11/18/16 8119   ??? hydrALAZINE (APRESOLINE) injection 10 mg  10 mg Intravenous Q6H PRN Jeffrey Cluck, MD       ??? insulin NPH (HumuLIN,NovoLIN) injection 8 Units  8 Units Subcutaneous BID Jeffrey Magnuson, MD   8 Units at 11/18/16 0630   ??? insulin regular (HumuLIN,NovoLIN) injection 0-12 Units  0-12 Units Subcutaneous [facility] Jeffrey Magnuson, MD   2 Units at 11/18/16 0046   ??? insulin regular (HumuLIN,NovoLIN) injection 4 Units  4 Units Subcutaneous Q6H [facility] Jeffrey Magnuson, MD   4 Units at 11/18/16 1212   ??? ipratropium (ATROVENT) 0.02 % nebulizer solution 500 mcg  500 mcg Nebulization Q6H PRN Jeffrey Cluck, MD   500 mcg at 11/15/16 2008   ??? labetalol (NORMODYNE,TRANDATE) injection 20 mg  20 mg Intravenous Q6H PRN Jeffrey Cluck, MD   20 mg at 11/05/16 1752   ??? lidocaine (LIDODERM) 5 % patch 1 patch  1 patch Transdermal Daily Jeffrey Alm, MD   1 patch at 11/15/16 [phone]   ??? melatonin tablet 3 mg  3 mg PEG Tube Nightly Jeffrey Rubens, MD   3 mg at 11/17/16 2030   ??? mycophenolate (CELLCEPT) 200 mg/mL suspension 500 mg  500 mg NG tube BID Jeffrey Rubens, MD   500 mg at 11/18/16 0626   ??? ocular lubricant (GENTEAL SEVERE) 0.3 % ophthalmic gel 1 drop  1 drop Both Eyes TID PRN Jeffrey Morale, MD   1 drop at 09/30/16 1619   ??? OLANZapine (ZYPREXA) tablet 1.25 mg  1.25 mg PEG Tube Daily Jeffrey Rubens, MD   1.25 mg at 11/18/16 1212   ??? OLANZapine (ZYPREXA) tablet 2.5 mg  2.5 mg PEG Tube BID PRN Jeffrey Rubens, MD       ??? OLANZapine [facility]) tablet 7.5 mg  7.5 mg PEG Tube Nightly Jeffrey Rubens, MD   7.5 mg at 11/17/16 2031   ??? omeprazole (PriLOSEC) suspension 20 mg  20 mg PEG Tube Daily Jeffrey Rubens, MD   20 mg at 11/18/16 0953   ??? ondansetron (ZOFRAN-ODT) disintegrating tablet 4 mg  4 mg Oral Q6H PRN Jeffrey Rubens, MD       ??? sildenafil (antihypertensive) (REVATIO) 10 mg/mL oral suspension 30 mg  30 mg PEG Tube Jeffrey Ward Jeffrey Rubens, MD   30 mg at 11/18/16 2956   ??? sodium chloride (NS) 0.9% infusion  100 mL/hr Intravenous Continuous Jeffrey Rubens, MD   Stopped at 11/17/16 0740   ??? sulfamethoxazole-trimethoprim (BACTRIM,SEPTRA) 200-40 mg/5 mL suspension 10 mL  10 mL PEG Tube Once per day on Mon Wed Fri Jeffrey Rubens, MD   10 mL at 11/18/16 2130   ??? thiamine (B-1) tablet 100 mg  100 mg PEG Tube Daily Jeffrey Rubens, MD   100 mg at 11/18/16 8657   ??? traMADol (ULTRAM) tablet 50 mg  50 mg PEG Tube Q6H PRN Jeffrey Rubens, MD   50 mg at 11/18/16 0957   ??? valGANciclovir (VALCYTE) 50 mg/mL oral solution 450 mg  450 mg PEG Tube Daily Jeffrey Rubens, MD   450 mg at 11/18/16 8469     Review of Systems:    Unable to complete due to patient factors.    Objective: :    Patient Vitals for the past 8 hrs:   BP Temp Temp src Pulse Resp SpO2  11/18/16 1123 133/62 36.3 ??C (97.3 ??F) Oral 78 18 98 %   11/18/16 0727 135/61 36.8 ??C (98.2 ??F) Oral 76 18 96 %   11/18/16 0700 - - - 79 - -       I/O this shift:  In: -   Out: 300 [Urine:300]    Physical Exam:  General appearance - sitting up in chair  ENT- NGT in place, about 55cm in, tape was hanging 10cm below nose  Resp - Trach collar,  no increased WOB  Neuro - alert  Psych- normal affect    Test Results    Glucose POC all reviewed.        Data Review  Lab Results   Component Value Date    A1C 8.0 (H) 09/14/2016    A1C 7.8 (H) 09/05/2016    A1C 8.5 (H) 07/20/2016     Lab Results   Component Value Date    GFR >= 60 02/15/2012    CREATININE 0.85 11/18/2016

## 2016-11-18 NOTE — Unmapped (Signed)
Problem: Patient Care Overview  Goal: Plan of Care Review  Outcome: Progressing  Patient updated on plan of care at beginning of shift and PRN.  VSS.  Voiding. -BM/+flatus.  Currently NPO, MIVF running @ 160mL/hr.  Pain controlled with PRN tramadol, given 2 doses of IV fentanyl given while pressure was being applied to PEG tube due to bleeding.  Up with assistance to the commode.  Will continue to monitor.

## 2016-11-18 NOTE — Unmapped (Signed)
Problem: Patient Care Overview  Goal: Plan of Care Review  Outcome: Progressing   11/18/16 0157   OTHER   Plan of Care Reviewed With patient;spouse   Plan of Care Review   Progress improving     A&O x4.  VSS, pain adequately managed.  Janina Mayo remains patent.  No bleeding around PEG tube site. UO adequate, no BM so far during shift.  Able to turn self in bed.  Remains free of falls/injury.

## 2016-11-18 NOTE — Unmapped (Signed)
Transplant Surgery Progress Note    Hospital Day: 66    Assessment:     Jeffrey Ward??is a 66 y.o.??male??with a history of IDDM, GERD, prior perioperative a-fib with RVR and 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 Post-op course complicated by decompensation thought to be secondary to acute right heart failure and AKI requiring CRRT (resolved). 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 Severe PAH diagnosed. Eventually had episodes of a. Fib/tachyarrythmia x3, converted with amiodarone, then with digoxin; Cardiology following closely. On 11/03/16 he had a significant spike in his LFTs suspicious for acute rejection; however liver biopsy from 11/04/16 did not show any evidence of rejection. He is currently working towards AIR.      Subjective/Interval Events:     CBC stable. Heparin gtt and warfarin restarted. TF started at trickle. RT to decannulate today.    Plan:     Neuro: Cognition/agitation continuing to improve  -Pain:  ???? -tylenol and tramadol  Psych:   ???? - d/c qam zyprexa  ???? - 7.5 zyprexa qhs + zyprexa PRN  ???? - thiamine, melatonin  -No MRI for 6 weeks after pacer placement (7/31)  ??  CV: Currently NSR   Cards following  - permanent pace maker placed 8/31  - Prev R heart failure, a fib/tachyarrythmia  - amiodarone and digoxin for afib with RVR  - Asystolic pauses  [ ]  EKG M/Th  ??  Pulm:   - decannulated 9/7  - Atrovent nebs q6 PRN  - Pulmonary artery hypertension - on sildenafil, off epoprostenol    FEN/GI:   - Liver US: intrahepatic ductal dilation   - Liver biopsy 8/24- no acute rejection  - Failed MBSS  - PPI for GI prophylaxis  - PEG 9/6  - Trickle TF 9/7, consider advancing today 9/8 to:  ????- start with 120 mL, run over pump x 1 hr   ????- increase bolus by 30 mL q4 hrs   ????- final goal: 1 can (237 mL) 5x daily (6a, 10a, 2p, 6p, 10p)  ???? ?? ?? ?? ?? ??At goal, order will provide 1185 mL, 2133 kcal, 96 g PRO, 190 g CHO, ?? ?? ?? 865 mL free water  ????- additional 100 mL FWF q2hrs or before and after each bolus to provide 1865 mL free water    Renal: AKI resolved  -??renally dose meds   ??  Endo:   -Endocrine following  -Current recs: NPH 10 U BID, 4U regular q6h, regular SSI  ??  Heme/ID:  - heparin gtt  - warfarin gtt  ??  Immunosuppresion  - 3/3 dose of 500 mg methylpredn 8/25 and taper. ??  - Tac -> cyclosporine - tremors improved   - continue MMF  ??  PPx:   Bactrim  Valcyte  ??  Dispo: Floor status  -5x high  - AIR pending       Objective:        Vital Signs:  BP 124/58  - Pulse 81  - Temp 35.2 ??C (Oral)  - Resp 16  - Ht 157 cm (5' 1.81)  - Wt 70.1 kg (154 lb 8.7 oz)  - SpO2 93%  - BMI 28.44 kg/m??     Input/Output:  I/O last 3 completed shifts:  In: 1500 [I.V.:925; NG/GT:575]  Out: 1750 [Urine:1750]    Physical Exam:    General: Comfortable, alert and interactive this morning. Feeling good; no acute complaints.  Neuro: Awake, interactive. Able  to talk hoarsely with speech valve.   Lungs: Still on trach collar (95F cuffless) at FiO2 28%, non labored breathing. Secretions lessening. On trach collar this morning and during sleep  Abdomen: incision c/d/i. Staples removed. Soft, non-tender. PEG in place    Ext: WWP, bruising in RUE improving.       Lab Results   Component Value Date    WBC 3.9 (L) 11/18/2016    HGB 8.6 (L) 11/18/2016    HCT 26.6 (L) 11/18/2016    PLT 133 (L) 11/18/2016       Lab Results   Component Value Date    NA 143 11/18/2016    K 3.7 11/18/2016    CL 110 (H) 11/18/2016    CO2 30.0 11/18/2016    BUN 21 11/18/2016    CREATININE 0.85 11/18/2016    CALCIUM 8.2 (L) 11/18/2016    MG 1.4 (L) 11/18/2016    PHOS 2.6 (L) 11/18/2016     Imaging:  No new imaging      Lysle Rubens MD  [facility]  [id number]

## 2016-11-19 LAB — BASIC METABOLIC PANEL
ANION GAP: 6 mmol/L — ABNORMAL LOW (ref 9–15)
BUN / CREAT RATIO: 20
CHLORIDE: 111 mmol/L — ABNORMAL HIGH (ref 98–107)
CO2: 26 mmol/L (ref 22.0–30.0)
CREATININE: 0.75 mg/dL (ref 0.70–1.30)
EGFR MDRD AF AMER: >=60 mL/min/{1.73_m2} (ref >=60)
EGFR MDRD NON AF AMER: >=60 mL/min/{1.73_m2} (ref >=60)
GLUCOSE RANDOM: 136 mg/dL (ref 65–179)
POTASSIUM: 3.9 mmol/L (ref 3.5–5.0)
SODIUM: 143 mmol/L (ref 135–145)

## 2016-11-19 LAB — PHOSPHORUS: Phosphate:MCnc:Pt:Ser/Plas:Qn:: 2.6 — ABNORMAL LOW

## 2016-11-19 LAB — CBC
HEMATOCRIT: 23.1 % — ABNORMAL LOW (ref 41.0–53.0)
HEMOGLOBIN: 7.2 g/dL — ABNORMAL LOW (ref 13.5–17.5)
HEMOGLOBIN: 9.4 g/dL — ABNORMAL LOW (ref 13.5–17.5)
MEAN CORPUSCULAR HEMOGLOBIN CONC: 31.4 g/dL (ref 31.0–37.0)
MEAN CORPUSCULAR HEMOGLOBIN CONC: 32.9 g/dL (ref 31.0–37.0)
MEAN CORPUSCULAR HEMOGLOBIN: 34.4 pg — ABNORMAL HIGH (ref 26.0–34.0)
MEAN CORPUSCULAR VOLUME: 105.4 fL — ABNORMAL HIGH (ref 80.0–100.0)
MEAN PLATELET VOLUME: 8.8 fL (ref 7.0–10.0)
MEAN PLATELET VOLUME: 8.4 fL (ref 7.0–10.0)
PLATELET COUNT: 115 10*9/L — ABNORMAL LOW (ref 150–440)
PLATELET COUNT: 209 10*9/L (ref 150–440)
RED BLOOD CELL COUNT: 2.19 10*12/L — ABNORMAL LOW (ref 4.50–5.90)
RED BLOOD CELL COUNT: 2.74 10*12/L — ABNORMAL LOW (ref 4.50–5.90)
RED CELL DISTRIBUTION WIDTH: 19.4 % — ABNORMAL HIGH (ref 12.0–15.0)
RED CELL DISTRIBUTION WIDTH: 19.6 % — ABNORMAL HIGH (ref 12.0–15.0)
WBC ADJUSTED: 5.4 10*9/L (ref 4.5–11.0)

## 2016-11-19 LAB — APTT
APTT: 186.2 s (ref 27.7–37.7)
APTT: 145 s — ABNORMAL HIGH (ref 27.7–37.7)
Coagulation surface induced:Time:Pt:PPP:Qn:Coag: 186.2
Coagulation surface induced:Time:Pt:PPP:Qn:Coag: 189.7
HEPARIN CORRELATION: 0.9

## 2016-11-19 LAB — HEPATIC FUNCTION PANEL
ALT (SGPT): 65 U/L (ref 19–72)
AST (SGOT): 42 U/L (ref 19–55)
BILIRUBIN DIRECT: 0.5 mg/dL — ABNORMAL HIGH (ref 0.00–0.40)
BILIRUBIN TOTAL: 0.9 mg/dL (ref 0.0–1.2)

## 2016-11-19 LAB — GAMMA GLUTAMYL TRANSFERASE: Gamma glutamyl transferase:CCnc:Pt:Ser/Plas:Qn:: 227 — ABNORMAL HIGH

## 2016-11-19 LAB — INR: Lab: 1.82

## 2016-11-19 LAB — CYCLOSPORINE (FPIA) BLOOD: Lab: 201

## 2016-11-19 LAB — HEPARIN CORRELATION: Lab: 0.7

## 2016-11-19 LAB — PLATELET COUNT: Lab: 115 — ABNORMAL LOW

## 2016-11-19 LAB — MEAN PLATELET VOLUME: Lab: 8.4

## 2016-11-19 LAB — MAGNESIUM: Magnesium:MCnc:Pt:Ser/Plas:Qn:: 1.3 — ABNORMAL LOW

## 2016-11-19 LAB — ALBUMIN: Albumin:MCnc:Pt:Ser/Plas:Qn:: 2.5 — ABNORMAL LOW

## 2016-11-19 LAB — ANION GAP: Anion gap 3:SCnc:Pt:Ser/Plas:Qn:: 6 — ABNORMAL LOW

## 2016-11-19 NOTE — Unmapped (Signed)
Problem: Patient Care Overview  Goal: Plan of Care Review  Outcome: Progressing   11/19/16 0100   OTHER   Plan of Care Reviewed With patient   Plan of Care Review   Progress improving     A&O x4.  VSS, pain adequately managed.  Trach removed during day shift.  Heparin infusion continued.  Nepro infusing through PEG tube at 6mL/hr, tolerating well.  Able to shift positions in bed.  Remains free of falls/injury.

## 2016-11-19 NOTE — Unmapped (Signed)
Transplant Surgery Progress Note    Hospital Day: 32    Assessment:     Jeffrey Ward??is a 66 y.o.??male??with a history of IDDM, GERD, prior perioperative a-fib with RVR and 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 Post-op course complicated by decompensation thought to be secondary to acute right heart failure and AKI requiring CRRT (resolved). 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 Severe PAH diagnosed. Eventually had episodes of a. Fib/tachyarrythmia x3, converted with amiodarone, then with digoxin; Cardiology following closely. On 11/03/16 he had a significant spike in his LFTs suspicious for acute rejection; however liver biopsy from 11/04/16 did not show any evidence of rejection. He is currently working towards AIR.      Subjective/Interval Events:     NAEO. Medically ready for discharge, ready for AIR    Plan:     Neuro: Cognition/agitation continuing to improve  -Pain:  ???? -tylenol and tramadol  Psych:   ???? - 7.5 zyprexa qhs + zyprexa PRN  ???? - thiamine, melatonin  -No MRI for 6 weeks after pacer placement (7/31)  ??  CV: Currently NSR   Cards following  - permanent pace maker placed 8/31  - Prev R heart failure, a fib/tachyarrythmia  - amiodarone and digoxin for afib with RVR  - Asystolic pauses  [ ]  EKG M/Th  ??  Pulm:   - decannulated 9/7  - Atrovent nebs q6 PRN  - Pulmonary artery hypertension - on sildenafil, off epoprostenol    FEN/GI:   - Liver US: intrahepatic ductal dilation   - Liver biopsy 8/24- no acute rejection  - Failed MBSS  - PPI for GI prophylaxis  - PEG 9/6  - PEG Feeds:  ????- start with 120 mL, run over pump x 1 hr   ????- increase bolus by 30 mL q4 hrs   ????- final goal: 1 can (237 mL) 5x daily (6a, 10a, 2p, 6p, 10p)  ???? ?? ?? ?? ?? ??At goal, order will provide 1185 mL, 2133 kcal, 96 g PRO, 190 g CHO, ?? ?? ?? 865 mL free water  ????- additional 100 mL FWF q2hrs or before and after each bolus to provide 1865 mL free water  - medlock  - 800 MG PO BID    Renal: AKI resolved  -??renally dose meds   ??  Endo:   -Endocrine following  -Current recs: NPH 10 U BID, 4U regular q6h, regular SSI  ??  Heme/ID:  - heparin gtt  - warfarin gtt  [ ]  f/u noon CBC  ??  Immunosuppresion  - 3/3 dose of 500 mg methylpredn 8/25 and taper. ??  - Tac -> cyclosporine - tremors improved   - continue MMF  ??  PPx:   Bactrim  Valcyte  ??  Dispo: Floor status  -5x high  - AIR pending       Objective:        Vital Signs:  BP 136/63  - Pulse 83  - Temp 35.6 ??C (Oral)  - Resp 16  - Ht 157 cm (5' 1.81)  - Wt 70.1 kg (154 lb 8.7 oz)  - SpO2 100%  - BMI 28.44 kg/m??     Input/Output:  I/O last 3 completed shifts:  In: 1756.1 [I.V.:1356.1; NG/GT:400]  Out: 1925 [Urine:1925]    Physical Exam:    General: Comfortable, alert and interactive this morning. Feeling good; no acute complaints.  Neuro: Awake, interactive. Able to talk hoarsely with  speech valve.   Lungs: SO 2L [place], decannulated  Abdomen: incision c/d/i. Staples removed. Soft, non-tender. PEG in place    Ext: WWP, bruising in RUE improving.       Lab Results   Component Value Date    WBC 2.8 (L) 11/19/2016    HGB 7.2 (L) 11/19/2016    HCT 23.1 (L) 11/19/2016    PLT 115 (L) 11/19/2016       Lab Results   Component Value Date    NA 143 11/19/2016    K 3.9 11/19/2016    CL 111 (H) 11/19/2016    CO2 26.0 11/19/2016    BUN 15 11/19/2016    CREATININE 0.75 11/19/2016    CALCIUM 8.4 (L) 11/19/2016    MG 1.3 (L) 11/19/2016    PHOS 2.6 (L) 11/19/2016     Imaging:  No new imaging      Lysle Rubens MD  [facility]  [id number]

## 2016-11-19 NOTE — Unmapped (Signed)
Pt remains on 5WST with stoma s/p trach decannulation yesterday. Dressing removed, site cleaned. Stoma site almost healed. Occlusive dressing applied. Pt tolerated well.

## 2016-11-19 NOTE — Unmapped (Signed)
[facility], [place], [place]    HEPATOLOGY INPATIENT -- FOLLOW-UP NOTE    Requesting Attending Physician:  Particia Nearing, MD    Reason for Consult:  Jeffrey Ward is a 66 y.o. male seen in consultation at the request of Dr. Particia Nearing, MD for PEG placement for dysphagia following transplant.    Interval History:  -- PEG tube placed on 11/17/2016, with no immediate complications, other than mild oozing at site of PEG placement.  -- Continued oozing at bedside, upon transfer to the floor, and incision site was sutured by surgery, with hemostasis  -- PEG site checked today, no further oozing, pain markedly improved, patient tolerated tube feeds.    Medications:  Current Facility-Administered Medications   Medication Dose Route Frequency Provider Last Rate Last Dose   ??? acetaminophen (TYLENOL) solution 650 mg  650 mg PEG Tube Q4H PRN Lysle Rubens, MD   650 mg at 11/17/16 2031   ??? amiodarone (PACERONE) tablet 200 mg  200 mg PEG Tube Daily Lysle Rubens, MD   200 mg at 11/18/16 0957   ??? amLODIPine (NORVASC) tablet 10 mg  10 mg PEG Tube Daily Lysle Rubens, MD   10 mg at 11/18/16 1610   ??? cycloSPORINE (NEORAL) microemulsion solution 200 mg  200 mg PEG Tube BID Lysle Rubens, MD   200 mg at 11/18/16 9604   ??? dextrose 50 % solution 12.5 g  12.5 g Intravenous Q10 Min PRN Jonell Cluck, MD   12.5 g at 10/29/16 1145   ??? digoxin (LANOXIN) tablet 62.5 mcg  62.5 mcg PEG Tube Daily Lysle Rubens, MD   62.5 mcg at 11/18/16 5409   ??? heparin (porcine) 1000 unit/mL injection 5,000 Units  5,000 Units Intravenous Q6H PRN Lysle Rubens, MD       ??? heparin 25,000 Units/250 mL (100 units/mL) infusion  18 Units/kg/hr Intravenous Continuous Lysle Rubens, MD 12.62 mL/hr at 11/18/16 1650 18 Units/kg/hr at 11/18/16 1650   ??? hydrALAZINE (APRESOLINE) injection 10 mg  10 mg Intravenous Q6H PRN Jonell Cluck, MD       ??? insulin NPH (HumuLIN,NovoLIN) injection 8 Units  8 Units Subcutaneous BID Denzil Magnuson, MD   8 Units at 11/18/16 0630   ??? insulin regular (HumuLIN,NovoLIN) injection 0-12 Units  0-12 Units Subcutaneous [facility] Denzil Magnuson, MD   2 Units at 11/18/16 0046   ??? insulin regular (HumuLIN,NovoLIN) injection 4 Units  4 Units Subcutaneous Q6H [facility] Denzil Magnuson, MD   4 Units at 11/18/16 1212   ??? ipratropium (ATROVENT) 0.02 % nebulizer solution 500 mcg  500 mcg Nebulization Q6H PRN Jonell Cluck, MD   500 mcg at 11/15/16 2008   ??? labetalol (NORMODYNE,TRANDATE) injection 20 mg  20 mg Intravenous Q6H PRN Jonell Cluck, MD   20 mg at 11/05/16 1752   ??? lidocaine (LIDODERM) 5 % patch 1 patch  1 patch Transdermal Daily Julio Alm, MD   1 patch at 11/15/16 [phone]   ??? melatonin tablet 3 mg  3 mg PEG Tube Nightly Lysle Rubens, MD   3 mg at 11/17/16 2030   ??? mycophenolate (CELLCEPT) 200 mg/mL suspension 500 mg  500 mg NG tube BID Lysle Rubens, MD   500 mg at 11/18/16 0626   ??? ocular lubricant (GENTEAL SEVERE) 0.3 % ophthalmic gel 1 drop  1 drop Both Eyes TID PRN Berenda Morale, MD   1 drop at 09/30/16 1619   ??? OLANZapine (ZYPREXA) tablet  2.5 mg  2.5 mg PEG Tube BID PRN Lysle Rubens, MD       ??? OLANZapine [facility]) tablet 7.5 mg  7.5 mg PEG Tube Nightly Lysle Rubens, MD   7.5 mg at 11/17/16 2031   ??? omeprazole (PriLOSEC) suspension 20 mg  20 mg PEG Tube Daily Lysle Rubens, MD   20 mg at 11/18/16 0953   ??? ondansetron (ZOFRAN-ODT) disintegrating tablet 4 mg  4 mg Oral Q6H PRN Lysle Rubens, MD       ??? sildenafil (antihypertensive) (REVATIO) 10 mg/mL oral suspension 30 mg  30 mg PEG Tube [facility] Lysle Rubens, MD   30 mg at 11/18/16 1406   ??? sodium chloride (NS) 0.9% infusion  100 mL/hr Intravenous Continuous Lysle Rubens, MD   Stopped at 11/17/16 0740   ??? sulfamethoxazole-trimethoprim (BACTRIM,SEPTRA) 200-40 mg/5 mL suspension 10 mL  10 mL PEG Tube Once per day on Mon Wed Fri Lysle Rubens, MD   10 mL at 11/18/16 6295   ??? thiamine (B-1) tablet 100 mg  100 mg PEG Tube Daily Lysle Rubens, MD   100 mg at 11/18/16 2841   ??? traMADol (ULTRAM) tablet 50 mg  50 mg PEG Tube Q6H PRN Lysle Rubens, MD   50 mg at 11/18/16 0957   ??? valGANciclovir (VALCYTE) 50 mg/mL oral solution 450 mg  450 mg PEG Tube Daily Lysle Rubens, MD   450 mg at 11/18/16 0954   ??? warfarin (COUMADIN) tablet 5 mg  5 mg Oral Daily Lysle Rubens, MD           Vital Signs:  Temp:  [35.2 ??C-36.8 ??C] 35.2 ??C  Heart Rate:  [74-88] 81  Resp:  [16-20] 16  BP: (120-135)/(56-62) 124/58  FiO2 (%):  [28 %] 28 %  SpO2:  [83 %-98 %] 93 %    Intake/Output last 3 shifts:  I/O last 3 completed shifts:  In: 1500 [I.V.:925; NG/GT:575]  Out: 1750 [Urine:1750]    Physical Exam:  Constitutional: Alert, Oriented x 3, No acute distress, well nourished and well hydrated.   Pulm: Tracheostomy in place, clear to auscultation bilaterally  CV: Regular rate and rhythm, normal S1, S2. No murmurs.   Abdomen: soft, normal bowel sounds, mildly, non-tender. No organomegaly. PEG site, with no surrounding erythema, dressing clean dry and intact.  Skin: No rashes, jaundice or skin lesions noted  Neuro: No focal deficits.     Diagnostic Studies:   Labs:  Recent Labs      11/17/16   1727  11/18/16   0514  11/18/16   1224   WBC  6.3  4.2*  3.9*   HGB  9.4*  7.9*  8.6*   HCT  28.3*  24.2*  26.6*   PLT  132*  124*  133*     Recent Labs      11/16/16   0531  11/17/16   0504  11/18/16   0514   NA  137  138  143   K  3.9  3.9  3.7   CL  100  102  110*   BUN  32*  28*  21   CREATININE  0.92  0.89  0.85   GLU  140  140  61*     Recent Labs      11/16/16   0531  11/17/16   0504  11/18/16   0514   PROT  4.9*  5.4*  4.5*   ALBUMIN  2.4*  2.9*  2.3*  AST  23  27  95*   ALT  63  58  90*   ALKPHOS  240*  253*  338*   BILITOT  1.0  1.3*  1.3*     Recent Labs      11/16/16   0531  11/16/16   1953  11/17/16   0504  11/18/16   0514   INR  1.68   --   1.47  1.48   APTT   --   64.8*  36.9   --        Imaging:  Reviewed.    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 Getting ready for rehab and still can't swallow. Tolerating Cor-pak feeds. Request for G-tube. Has small window w/ surgical incision and some gas-filled bowel nearby, but will attempt. He was on coumadin for afib. S/p PEG on 9/6 with oozing at PEG site, sutured, with no further oozing.    RECOMMENDATIONS:  - PEG site with no induration, erythema, no further oozing following sutures placed at incision site yesterday afternoon  - Continue tube feeds, per nutrition consult  - Hepatology will sign off at this time.    Please page [phone] for further questions/concerns.  This patient was seen and examined, along with Attending, DR. Julieta Gutting.    Karma Ganja, MD  Gastroenterology Fellow  [employer] at [facility]    ----- -----    It was medically necessary for me to see the patient because of the complexity of the liver disease.  My visit included an interview and examination of this patient.  I reviewed pertinent lab, radiographic and chart data.  I discussed the case with my fellow and edited the note. I agree with the assessment and plan as outlined.    Contact me at [phone] (pager) or [phone] (cell), if you have questions.    [facility] Julieta Gutting, MD, MPH    ----- ------

## 2016-11-20 DIAGNOSIS — K7469 Other cirrhosis of liver: Principal | ICD-10-CM

## 2016-11-20 LAB — BUN / CREAT RATIO: Urea nitrogen/Creatinine:MRto:Pt:Ser/Plas:Qn:: 16

## 2016-11-20 LAB — CBC
HEMATOCRIT: 22.8 % — ABNORMAL LOW (ref 41.0–53.0)
HEMATOCRIT: 21.1 % — ABNORMAL LOW (ref 41.0–53.0)
HEMATOCRIT: 27.7 % — ABNORMAL LOW (ref 41.0–53.0)
HEMOGLOBIN: 6.8 g/dL — ABNORMAL LOW (ref 13.5–17.5)
MEAN CORPUSCULAR HEMOGLOBIN CONC: 32.2 g/dL (ref 31.0–37.0)
MEAN CORPUSCULAR HEMOGLOBIN CONC: 32.4 g/dL (ref 31.0–37.0)
MEAN CORPUSCULAR HEMOGLOBIN CONC: 34.6 g/dL (ref 31.0–37.0)
MEAN CORPUSCULAR HEMOGLOBIN: 33.3 pg (ref 26.0–34.0)
MEAN CORPUSCULAR HEMOGLOBIN: 31.6 pg (ref 26.0–34.0)
MEAN CORPUSCULAR VOLUME: 103.2 fL — ABNORMAL HIGH (ref 80.0–100.0)
MEAN CORPUSCULAR VOLUME: 103.4 fL — ABNORMAL HIGH (ref 80.0–100.0)
MEAN CORPUSCULAR VOLUME: 91.3 fL (ref 80.0–100.0)
MEAN PLATELET VOLUME: 8.2 fL (ref 7.0–10.0)
MEAN PLATELET VOLUME: 9 fL (ref 7.0–10.0)
MEAN PLATELET VOLUME: 8.8 fL (ref 7.0–10.0)
PLATELET COUNT: 167 10*9/L (ref 150–440)
PLATELET COUNT: 168 10*9/L (ref 150–440)
RED BLOOD CELL COUNT: 2.21 10*12/L — ABNORMAL LOW (ref 4.50–5.90)
RED BLOOD CELL COUNT: 2.04 10*12/L — ABNORMAL LOW (ref 4.50–5.90)
RED BLOOD CELL COUNT: 3.04 10*12/L — ABNORMAL LOW (ref 4.50–5.90)
RED CELL DISTRIBUTION WIDTH: 19.6 % — ABNORMAL HIGH (ref 12.0–15.0)
RED CELL DISTRIBUTION WIDTH: 19.7 % — ABNORMAL HIGH (ref 12.0–15.0)
RED CELL DISTRIBUTION WIDTH: 21.7 % — ABNORMAL HIGH (ref 12.0–15.0)
WBC ADJUSTED: 3.4 10*9/L — ABNORMAL LOW (ref 4.5–11.0)
WBC ADJUSTED: 5.2 10*9/L (ref 4.5–11.0)
WBC ADJUSTED: 6.3 10*9/L (ref 4.5–11.0)

## 2016-11-20 LAB — SPECIMEN SOURCE

## 2016-11-20 LAB — HEMOGLOBIN: Lab: 6.8 — ABNORMAL LOW

## 2016-11-20 LAB — BASIC METABOLIC PANEL
ANION GAP: 6 mmol/L — ABNORMAL LOW (ref 9–15)
BLOOD UREA NITROGEN: 11 mg/dL (ref 7–21)
BLOOD UREA NITROGEN: 23 mg/dL — ABNORMAL HIGH (ref 7–21)
BUN / CREAT RATIO: 16
BUN / CREAT RATIO: 19
CALCIUM: 9 mg/dL (ref 8.5–10.2)
CALCIUM: 8 mg/dL — ABNORMAL LOW (ref 8.5–10.2)
CHLORIDE: 108 mmol/L — ABNORMAL HIGH (ref 98–107)
CHLORIDE: 108 mmol/L — ABNORMAL HIGH (ref 98–107)
CO2: 28 mmol/L (ref 22.0–30.0)
CO2: 26 mmol/L (ref 22.0–30.0)
CREATININE: 1.19 mg/dL (ref 0.70–1.30)
EGFR MDRD AF AMER: >=60 mL/min/{1.73_m2} (ref >=60)
EGFR MDRD AF AMER: >=60 mL/min/{1.73_m2} (ref >=60)
EGFR MDRD NON AF AMER: >=60 mL/min/{1.73_m2} (ref >=60)
EGFR MDRD NON AF AMER: >=60 mL/min/{1.73_m2} (ref >=60)
GLUCOSE RANDOM: 115 mg/dL (ref 65–179)
GLUCOSE RANDOM: 122 mg/dL — ABNORMAL HIGH (ref 65–99)
SODIUM: 142 mmol/L (ref 135–145)
SODIUM: 139 mmol/L (ref 135–145)

## 2016-11-20 LAB — HEPATIC FUNCTION PANEL
ALBUMIN: 2.5 g/dL — ABNORMAL LOW (ref 3.5–5.0)
ALT (SGPT): 59 U/L (ref 19–72)
AST (SGOT): 32 U/L (ref 19–55)
BILIRUBIN DIRECT: 0.5 mg/dL — ABNORMAL HIGH (ref 0.00–0.40)
PROTEIN TOTAL: 4.9 g/dL — ABNORMAL LOW (ref 6.5–8.3)

## 2016-11-20 LAB — CYCLOSPORINE (FPIA) BLOOD: Lab: 214

## 2016-11-20 LAB — HEPARIN CORRELATION: Lab: 0.6

## 2016-11-20 LAB — GAMMA GLUTAMYL TRANSFERASE: Gamma glutamyl transferase:CCnc:Pt:Ser/Plas:Qn:: 245 — ABNORMAL HIGH

## 2016-11-20 LAB — RED CELL DISTRIBUTION WIDTH: Lab: 19.6 — ABNORMAL HIGH

## 2016-11-20 LAB — APTT: APTT: 136.7 s — ABNORMAL HIGH (ref 27.7–37.7)

## 2016-11-20 LAB — MEAN CORPUSCULAR VOLUME: Lab: 91.3

## 2016-11-20 LAB — BLOOD GAS, ARTERIAL
BASE EXCESS ARTERIAL: -0.8 (ref -2.0–2.0)
FIO2 ARTERIAL: 40
PCO2 ARTERIAL: 26.4 mmHg — ABNORMAL LOW (ref 35.0–45.0)
PH ARTERIAL: 7.53 — ABNORMAL HIGH (ref 7.35–7.45)

## 2016-11-20 LAB — INR: Lab: 1.96

## 2016-11-20 LAB — MAGNESIUM: Magnesium:MCnc:Pt:Ser/Plas:Qn:: 1.4 — ABNORMAL LOW

## 2016-11-20 LAB — PHOSPHORUS: Phosphate:MCnc:Pt:Ser/Plas:Qn:: 2.8 — ABNORMAL LOW

## 2016-11-20 LAB — CREATININE: Creatinine:MCnc:Pt:Ser/Plas:Qn:: 1.19

## 2016-11-20 LAB — HEMATOCRIT: Lab: 23.7 — ABNORMAL LOW

## 2016-11-20 LAB — ALBUMIN: Albumin:MCnc:Pt:Ser/Plas:Qn:: 2.5 — ABNORMAL LOW

## 2016-11-20 NOTE — Unmapped (Signed)
Arterial Line Placement    Diagnosis: Patient Active Problem List   Diagnosis   ??? Hepatitis C   ??? Low back pain   ??? Hematuria   ??? Calculus of ureter   ??? Anemia   ??? Benign prostatic hyperplasia   ??? Chronic pain disorder   ??? Dysrhythmia   ??? History of gastroesophageal reflux (GERD)   ??? History of hepatitis C virus infection   ??? History of substance abuse   ??? Hyperglycemia, unspecified   ??? Intermittent vertigo   ??? Microscopic hematuria   ??? Postlaminectomy syndrome, lumbar region   ??? Thrombocytopenia (CMS-HCC)   ??? Vitamin D deficiency   ??? Atrial fibrillation with RVR (CMS-HCC)   ??? Secondary esophageal varices (CMS-HCC)   ??? Right ventricular dilation   ??? End-stage liver disease (CMS-HCC)   ??? HCC (hepatocellular carcinoma) (CMS-HCC)   ??? Paroxysmal atrial fibrillation (CMS-HCC)   ??? Hepatic encephalopathy (CMS-HCC)   ??? S/P liver transplant (CMS-HCC)   ??? Pulmonary hypertension, moderate to severe (CMS-HCC)   ??? Acute right heart failure (CMS-HCC)   ??? Tracheostomy dependent (CMS-HCC)   ??? Mixed level of activity delirium due to multiple etiologies, persistent        INDICATIONS:   Need for continuous blood pressure monitoring and frequent blood gas analysis. associated with acute onset upper GI bleed and acute blood loss anemia in anticoagulated pt   PROCEDURE:   Arterial Line Placement   LOCATION: A 20 G arterial line was placed in the left radial.   ANESTHESIA:   None   COMPLICATIONS:   None     CONSENT/TIME OUT:      Emergent consent was obtained prior to the procedure and is detailed in the medical record.  Prior to the start of the procedure, a time out was taken and the identity of the patient was confirmed via name and medical record number.  The correct side was confirmed if applicable.  The positioning of the patient and presence of correct equipment were verified.     DESCRIPTION OF PROCEDURE:      The skin over the artery was prepped and draped in sterile fashion.  Local anesthesia was introduced.  Using ultrasound for guidance, the artery was located and 20G Arrow kit was used to cannulate the artery. The needle was advanced until a flash of arterial blood was observed. The guide wire was inserted into the vessel, and the catheter was advanced smoothly over the wire into the vessel. Return of brisk arterial flow was obtained and an accurate waveform was noted when the line was connected to the transducer. The line was sutured in place with silk suture and secured with a Tegaderm and tape. The patient was returned to pre-procedural condition.        Dominga Ferry, MD  11/20/2016  10:19 AM     I was present during all critical and key portions of the procedure(s) and immediately available to furnish services the entire duration.  See resident note for details. Bo Mcclintock, MD

## 2016-11-20 NOTE — Unmapped (Signed)
Problem: Patient Care Overview  Goal: Plan of Care Review  Outcome: Progressing   11/20/16 0038   OTHER   Plan of Care Reviewed With patient   Plan of Care Review   Progress improving     A&O x4.  VSS, pain adequately managed.  Intermittent tube feeds and flushes done Q6hr via PEG tube, tolerated well.  Heparin drip continued.  Able to shift weight in bed independently.  Remains free of falls/injury.

## 2016-11-20 NOTE — Unmapped (Signed)
ICU ADMISSION NOTE    Consulting attending:   Consulting service: Trauma/Critical Care  Contact pager: [phone]     Requesting attending: Particia Nearing, MD  Requesting service:       Assessment: This is a 66yo M hx of IDDM, GERD, prior perioperative a-fib with RVR and cirrhosis secondary to chronic hepatitis C and HCC s/p liver transplantation on 09/15/16. Decompensation post-op 2/2 acute R Heart failure w/ subsequent PAH. +AKI required CRRT (now resolved). S/p pacemaker placement. Cardiology following for arrythmias (Afib, converted with amiodarone/digoxin). Suspicion for acute liver rejection (ruled out). Presents to SICU with hematemesis thought to be 2/2 a GI bleed.  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    FEN/GI:  F: 75cc/hr LR   E: replete lytes as needed  N: NPO    - Thiamine     *S/P liver transplant (HCC, cirrhosis)  - Liver biopsy 8/24- no acute rejection  - PPI for GI prophylaxis  - PEG 9/6    *GIB (hematemesis)  -GI consulted-  Scope: minor active bleeding - epi injected, one clip.  -Protonix gtt (-9/11)  -Trend H/H ON  - Hgb 9.6 s/p 2u PRBC     HEME/ID:   *Immunosuppression s/p Liver tsplx  - Tapered methylprednisolone  - Tacrolimus to Cyclosporine (tremor improvement)  - MMF  -Bactrim/Valcyte PPx     *Anticoagulation for Afib  -Warfarin/Heparin  -Daily labs (CBC/BMP/Coags)    GU:  Continue foley for strict Is and Os  -Stable    ENDO:  *IDDM  -SSI standard       LINES: PIV x4, PEG,     DISPO: Continue ICU care.    PROPHY:  Chlorhexedine: Yes  DVT prophylaxis: Holding   SBT Trial: Daily  Stress Ulcer Prophy: PPI gtt     ATTESTATION    I saw the patient when the patient  was critically ill with acidosis, acute blood loss anemia and coagulopathy associated with coumadin..  Critical care time spent was 30 min exclusive of procedures.  I managed his reversal of anticoagulation with PCC, fluid resuscitation, transfusion, sedation and pain medication, airway, oxygen and intravenous access. I personally viewed his chest xrayand made management decisions related to identified issues in the note. Care in the sicu is appropriate    Impressive GI bleed, draining both through G tube and with melena per rectum.  Intubated urgently after arrival in SICU.  He had poor IV access, but we quickly obtained two large bore IV's which we used for blood products and sedation.  GI came down to UGI scope the patient, identifying a small bleeder which they felt they controlled.     Mckaylie Vasey    REASON FOR CONSULTATION:   We are seeing Mr. Owain Eckerman at the request  of Particia Nearing, MD for management of postoperative acute GIB requiring intubation and resuscitation/ close monitoring.     HISTORY OF PRESENT ILLNESS:   This is a 66 y.o. male who  has a past medical history of Cancer (CMS-HCC); Cirrhosis (CMS-HCC); Diabetes (CMS-HCC); Hepatitis C (07/17/2012); Liver disease; Low back pain (07/17/2012); and Varices, esophageal (CMS-HCC). who presented on 09/14/2016 with Hematemesis.    ALLERGIES:   No Known Allergies    MEDICATIONS:   Prior to Admission medications    Medication Sig Start Date End Date Taking? Authorizing Provider   cholecalciferol, vitamin D3, (VITAMIN D3) 2,000 unit cap Take 1 capsule by mouth once daily.    Historical Provider, MD   FOLIC ACID/MULTIVIT-MIN/LUTEIN (CENTRUM SILVER ORAL) Take 1 tablet by mouth.    Historical Provider, MD furosemide (LASIX) 20 MG tablet Take 1 tablet (20 mg total) by mouth Two (2) times a day. 04/06/16 04/06/17  Danielle Mathews Argyle, PA   insulin degludec 200 unit/mL (3 mL) InPn Inject 24 Units under the skin every evening.  06/22/16   Historical Provider, MD   lancing device Misc 1 each. 05/25/16   Historical Provider, MD   levoFLOXacin (LEVAQUIN) 500 MG tablet Take 500 mg by mouth daily.  06/17/16   Historical Provider, MD   pen needle, diabetic 31 gauge x 5/16 Ndle Use as directed. 05/25/16 05/25/17  Historical Provider, MD   rifAXIMin (XIFAXAN) 550 mg Tab Take 1 tablet (550 mg total) by mouth Two (2) times a day. 07/07/16   Dawn Renato Gails, FNP   spironolactone (ALDACTONE) 50 MG tablet Take 1 tablet (50 mg total) by mouth daily. 04/06/16 04/06/17  Danielle Mathews Argyle, PA   sucralfate (CARAFATE) 1 gram tablet Take 1 g by mouth daily.  03/04/16   Historical Provider, MD   valGANciclovir (VALCYTE) 50 mg/mL SolR 9 mL (450 mg total) by PEG Tube route daily. 11/17/16 12/16/16  Carolan Shiver, FNP       PAST MEDICAL HISTORY:    has a past medical history of Cancer (CMS-HCC); Cirrhosis (CMS-HCC); Diabetes (CMS-HCC); Hepatitis C (07/17/2012); Liver disease; Low back pain (07/17/2012); and Varices, esophageal (CMS-HCC).    PAST SURGICAL HISTORY:   Past Surgical History:   Procedure Laterality Date   ??? ANKLE SURGERY     ??? BACK SURGERY     ??? BACK SURGERY     ??? CHG US GUIDE, TISSUE ABLATION N/A 01/22/2016    Procedure: ULTRASOUND GUIDANCE FOR, AND MONITORING OF, PARENCHYMAL TISSUE ABLATION;  Surgeon: Particia Nearing, MD;  Location: MAIN OR [facility];  Service: Transplant   ??? IR EMBOLIZATION ORGAN ISCHEMIA, TUMORS, INFAR  06/16/2016    IR EMBOLIZATION ORGAN ISCHEMIA, TUMORS, INFAR 06/16/2016 Ammie Dalton, MD IMG VIR H&V [facility]   ??? PR COLON CA SCRN NOT HI RSK IND N/A 02/27/2015    Procedure: COLOREC CNCR SCR;COLNSCPY NO;  Surgeon: Vonda Antigua, MD;  [facility]: [facility];  Service: Gastroenterology   ??? PR ENDOSCOPIC ULTRASOUND EXAM N/A 02/27/2015    Procedure: UGI ENDO; W/ENDO ULTRASOUND EXAM INCLUDES ESOPHAGUS, STOMACH, &/OR DUODENUM/JEJUNUM;  Surgeon: Vonda Antigua, MD;  Location: GI [facility]URES [facility];  Service: Gastroenterology   ??? PR INSER HEART TEMP PACER ONE CHMBR N/A 10/02/2016    Procedure: Tempoarary Pacemaker Insertion;  Surgeon: Meredith Leeds, MD;  Location: [facility] EP;  Service: Cardiology   ??? PR LAP,DIAGNOSTIC ABDOMEN N/A 01/22/2016    Procedure: Laparoscopy, Abdomen, Peritoneum, & Omentum, Diagnostic, W/Wo Collection Specimen(S) By  Brushing Or Washing;  Surgeon: Particia Nearing, MD;  Location: MAIN OR [facility];  Service: Transplant   ??? PR PLACE PERCUT GASTROSTOMY TUBE N/A 11/17/2016    Procedure: UGI ENDO; W/DIRECTED PLCMT PERQ GASTROSTOMY TUBE;  Surgeon: Cletis Athens, MD;  Location: GI [facility];  Service: Gastroenterology   ??? PR TRACHEOSTOMY, PLANNED N/A 09/29/2016    Procedure: TRACHEOSTOMY PLANNED (SEPART PROC);  Surgeon: Katherina Mires, MD;  Location: MAIN OR [facility];  Service: Trauma   ??? PR TRANSCATH INSERT OR REPLACE LEADLESS PM VENTR N/A 10/11/2016    Procedure: Pacemaker Implant/Replace Leadless;  Surgeon: Meredith Leeds, MD;  Location: [facility] EP;  Service: Cardiology   ??? PR TRANSPLANT LIVER,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    Procedure: Liver Allotransplantation; Orthotopic, Partial Or Whole, From Cadaver Or Living Donor, Any Age;  Surgeon: Doyce Loose, MD;  Location: MAIN OR [facility];  Service: Transplant   ??? PR TRANSPLANT,PREP DONOR LIVER, WHOLE N/A 09/15/2016    Procedure: Rogelia Boga Std Prep Cad Donor Whole Liver Gft Prior Tnsplnt,Inc Chole,Diss/Rem Surr Tissu Wo Triseg/Lobe Splt;  Surgeon: Doyce Loose, MD;  Location: MAIN OR [facility];  Service: Transplant   ??? PR UPPER GI ENDOSCOPY,BIOPSY N/A 07/17/2012    Procedure: UGI ENDOSCOPY; WITH BIOPSY, SINGLE OR MULTIPLE;  Surgeon: Alba Destine, MD;  Location: [facility]; Service: Gastroenterology   ??? PR UPPER GI ENDOSCOPY,DIAGNOSIS N/A 02/04/2014    Procedure: UGI ENDO, INCLUDE ESOPHAGUS, STOMACH, & DUODENUM &/OR JEJUNUM; DX W/WO COLLECTION SPECIMN, BY BRUSH OR WASH;  Surgeon: Wilburt Finlay, MD;  Location: GI [facility];  Service: Gastroenterology   ??? PR UPPER GI ENDOSCOPY,LIGAT VARIX N/A 11/05/2013    Procedure: UGI ENDO; W/BAND LIG ESOPH &/OR GASTRIC VARICES;  Surgeon: Wilburt Finlay, MD;  Location: [facility];  Service: Gastroenterology       FAMILY HISTORY:   Family History   Problem Relation Age of Onset   ??? Hypertension Mother    ??? Cirrhosis Neg Hx    ??? Liver cancer Neg Hx        SOCIAL HISTORY:   Social History     Social History   ??? Marital status: Married     Spouse name: N/A   ??? Number of children: N/A   ??? Years of education: N/A     Occupational History   ??? Not on file.     Social History Main Topics   ??? Smoking status: Never Smoker   ??? Smokeless tobacco: Never Used      Comment: Smoked in high school for about 5 years.    ??? Alcohol use No   ??? Drug use: No   ??? Sexual activity: Yes     Partners: Female     Birth control/ protection: None     Other Topics Concern   ??? Not on file     Social History Narrative   ??? No narrative on file       LABS:     Lab Results   Component Value Date    WBC 5.2 11/20/2016    HGB 6.8 (L) 11/20/2016    HCT 21.1 (L) 11/20/2016    PLT 182 11/20/2016       Lab Results   Component Value Date    NA 142 11/20/2016    K 3.7 11/20/2016    CL 108 (H) 11/20/2016    CO2 28.0 11/20/2016    BUN 11 11/20/2016    CREATININE  0.67 (L) 11/20/2016    GLU 115 11/20/2016    CALCIUM 9.0 11/20/2016    MG 1.4 (L) 11/20/2016    PHOS 2.8 (L) 11/20/2016       Lab Results   Component Value Date    BILITOT 0.9 11/20/2016    BILIDIR 0.50 (H) 11/20/2016    PROT 4.9 (L) 11/20/2016    ALBUMIN 2.5 (L) 11/20/2016    ALT 59 11/20/2016    AST 32 11/20/2016    ALKPHOS 295 (H) 11/20/2016    GGT 245 (H) 11/20/2016       Lab Results   Component Value Date    LABPROT 14.5 (H) 01/09/2014    INR 1.96 11/20/2016    APTT 136.7 (H) 11/20/2016        IMAGING:   No results found.    PHYSICAL EXAM:   GEN: dyspneic, arousable  EYES: EOMI  ENT: MMM  CV: RRR, no murmurs appreciated  PULM: +rhonci diffusely  ABD: soft,+TTP diffusely,ND, +BS  EXT: No edema  NEURO: No focal deficits  PSYCH: A+Ox3, appropriate  GU: No CVA tenderness  MSK: No spinal tenderness    Vitals:    11/20/16 0910   BP:    Pulse: 93   Resp:    Temp:    SpO2:        I/O last 3 completed shifts:  In: 4844.8 [I.V.:3054.8; NG/GT:1790]  Out: 3600 [Urine:3600]  No intake/output data recorded.        Patient Active Problem List   Diagnosis   ??? Hepatitis C   ??? Low back pain   ??? Hematuria   ??? Calculus of ureter   ??? Anemia   ??? Benign prostatic hyperplasia   ??? Chronic pain disorder   ??? Dysrhythmia   ??? History of gastroesophageal reflux (GERD)   ??? History of hepatitis C virus infection   ??? History of substance abuse   ??? Hyperglycemia, unspecified   ??? Intermittent vertigo   ??? Microscopic hematuria   ??? Postlaminectomy syndrome, lumbar region   ??? Thrombocytopenia (CMS-HCC)   ??? Vitamin D deficiency   ??? Atrial fibrillation with RVR (CMS-HCC)   ??? Secondary esophageal varices (CMS-HCC)   ??? Right ventricular dilation   ??? End-stage liver disease (CMS-HCC)   ??? HCC (hepatocellular carcinoma) (CMS-HCC)   ??? Paroxysmal atrial fibrillation (CMS-HCC)   ??? Hepatic encephalopathy (CMS-HCC)   ??? S/P liver transplant (CMS-HCC)   ??? Pulmonary hypertension, moderate to severe (CMS-HCC)   ??? Acute right heart failure (CMS-HCC)   ??? Tracheostomy dependent (CMS-HCC)   ??? Mixed level of activity delirium due to multiple etiologies, persistent

## 2016-11-20 NOTE — Unmapped (Signed)
Transplant Surgery Progress Note    Hospital Day: 50    Assessment:     Jeffrey Ward??is a 66 y.o.??male??with a history of IDDM, GERD, prior perioperative a-fib with RVR and 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 Post-op course complicated by decompensation thought to be secondary to acute right heart failure and AKI requiring CRRT (resolved). 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 Severe PAH diagnosed. Eventually had episodes of a. Fib/tachyarrythmia x3, converted with amiodarone, then with digoxin; Cardiology following closely. On 11/03/16 he had a significant spike in his LFTs suspicious for acute rejection; however liver biopsy from 11/04/16 did not show any evidence of rejection.     Underwent G tube on 11/17/2016.  Hep gtt restarted for bridging to coumadin.        Subjective/Interval Events:     On the AM of 9/9 he had blood noted from his G tube.  H/H from the morning was 7.2.  He got up to have a bowel movement and vasovagaled.  A rapid response was called.  Repeat H/H at that time was 6.8.  SBP 120s and HR 90s.  GI contacted for possible scope.  After, he started to have SOB.  A code blue was called for concern for need of intubation.  Was saturating 100% at that time.  Having more blood coming from G tube.  Decision made to transfer to the ICU for intubation and EGD. Was started on 1 u pRBC at transfer. On arrival to the unit his SBP 70s.  Tachycardic to the low 100s.  Continued total of 2 u pRBC.    Plan:     Neuro: Cognition/agitation continuing to improve  -Pain:  ???? -tylenol and tramadol  Psych:   ???? - 7.5 zyprexa qhs + zyprexa PRN  ???? - thiamine, melatonin  -No MRI for 6 weeks after pacer placement (7/31)  ??  CV: Currently NSR   Cards following  - permanent pace maker placed 8/31  - Prev R heart failure, a fib/tachyarrythmia  - amiodarone and digoxin for afib with RVR  - Asystolic pauses  [ ]  EKG M/Th  ??  Pulm: Reintubated 9/9  - Atrovent nebs q6 PRN  - Pulmonary artery hypertension - on sildenafil, off epoprostenol    FEN/GI:   - Liver US: intrahepatic ductal dilation   - Liver biopsy 8/24- no acute rejection  - Failed MBSS s/p PEG 9/6  - PEG Feeds - stopped at this time due to GI bleed.:  ????- start with 120 mL, run over pump x 1 hr   ????- increase bolus by 30 mL q4 hrs   ????- final goal: 1 can (237 mL) 5x daily (6a, 10a, 2p, 6p, 10p)  ???? ?? ?? ?? ?? ??At goal, order will provide 1185 mL, 2133 kcal, 96 g PRO, 190 g CHO, ?? ?? ?? 865 mL free water  ????- additional 100 mL FWF q2hrs or before and after each bolus to provide 1865 mL free water  - Started protonix gtt  - GI to perform EGD today    Renal: AKI resolved  -??renally dose meds   ??  Endo:   -Endocrine following  -Current recs: NPH 10 U BID, 4U regular q6h, regular SSI  ??  Heme/ID:  - GI bleed, likely from stomach, possibly around G tube  - Heparin gtt stopped  - Coumadin stopped  - 2 u pRBC transfused  ??  Immunosuppresion  - 3/3  dose of 500 mg methylpredn 8/25 and taper. ??  - Tac -> cyclosporine - tremors improved   - continue MMF  ??  PPx:   Bactrim  Valcyte  ??  Dispo: Transfer to ICU    Kandee Keen, MD  General Surgery, PGY3  Pager # [phone]      Objective:        Vital Signs:  BP 132/65  - Pulse 95  - Temp 36.9 ??C (Oral)  - Resp 18  - Ht 157 cm (5' 1.81)  - Wt 70.1 kg (154 lb 8.7 oz)  - SpO2 100%  - BMI 28.44 kg/m??     Input/Output:  I/O last 3 completed shifts:  In: 4844.8 [I.V.:3054.8; NG/GT:1790]  Out: 3600 [Urine:3600]    Physical Exam:    General: Appears more pale today  Neuro: Awake, interactive, but when . Able to talk hoarsely with speech valve.   Lungs: SO 2L [place], decannulated  Abdomen: incision c/d/i. Staples removed. Soft, non-tender. PEG in place with clotted blood being aspirated.  Small amount of oozing around tube.    Ext: [facility]      Lab Results   Component Value Date    WBC 5.2 11/20/2016    HGB 6.8 (L) 11/20/2016    HCT 21.1 (L) 11/20/2016    PLT 182 11/20/2016 Lab Results   Component Value Date    NA 142 11/20/2016    K 3.7 11/20/2016    CL 108 (H) 11/20/2016    CO2 28.0 11/20/2016    BUN 11 11/20/2016    CREATININE 0.67 (L) 11/20/2016    CALCIUM 9.0 11/20/2016    MG 1.4 (L) 11/20/2016    PHOS 2.8 (L) 11/20/2016     Imaging:  No new imaging

## 2016-11-21 LAB — HEPATIC FUNCTION PANEL
ALBUMIN: 2.6 g/dL — ABNORMAL LOW (ref 3.5–5.0)
ALKALINE PHOSPHATASE: 166 U/L — ABNORMAL HIGH (ref 38–126)
ALT (SGPT): 44 U/L (ref 19–72)
AST (SGOT): 18 U/L — ABNORMAL LOW (ref 19–55)
BILIRUBIN TOTAL: 1.3 mg/dL — ABNORMAL HIGH (ref 0.0–1.2)

## 2016-11-21 LAB — BASIC METABOLIC PANEL
ANION GAP: 4 mmol/L — ABNORMAL LOW (ref 9–15)
BLOOD UREA NITROGEN: 21 mg/dL (ref 7–21)
BLOOD UREA NITROGEN: 18 mg/dL (ref 7–21)
BUN / CREAT RATIO: 23
BUN / CREAT RATIO: 22
CHLORIDE: 107 mmol/L (ref 98–107)
CHLORIDE: 105 mmol/L (ref 98–107)
CO2: 31 mmol/L — ABNORMAL HIGH (ref 22.0–30.0)
CO2: 29 mmol/L (ref 22.0–30.0)
CREATININE: 0.82 mg/dL (ref 0.70–1.30)
EGFR MDRD AF AMER: >=60 mL/min/{1.73_m2} (ref >=60)
EGFR MDRD AF AMER: >=60 mL/min/{1.73_m2} (ref >=60)
EGFR MDRD NON AF AMER: >=60 mL/min/{1.73_m2} (ref >=60)
EGFR MDRD NON AF AMER: >=60 mL/min/{1.73_m2} (ref >=60)
GLUCOSE RANDOM: 143 mg/dL (ref 65–179)
GLUCOSE RANDOM: 155 mg/dL (ref 65–179)
POTASSIUM: 3.8 mmol/L (ref 3.5–5.0)
POTASSIUM: 4.6 mmol/L (ref 3.5–5.0)
SODIUM: 142 mmol/L (ref 135–145)
SODIUM: 142 mmol/L (ref 135–145)

## 2016-11-21 LAB — PHOSPHORUS
PHOSPHORUS: 2.4 mg/dL — ABNORMAL LOW (ref 2.9–4.7)
Phosphate:MCnc:Pt:Ser/Plas:Qn:: 2.4 — ABNORMAL LOW
Phosphate:MCnc:Pt:Ser/Plas:Qn:: 4.8 — ABNORMAL HIGH

## 2016-11-21 LAB — CREATININE, URINE
CREATININE, URINE: 14.6 mg/dL
Lab: 14.6

## 2016-11-21 LAB — PLATELET COUNT
Lab: 104 — ABNORMAL LOW
Lab: 112 — ABNORMAL LOW

## 2016-11-21 LAB — CBC
HEMATOCRIT: 21.5 % — ABNORMAL LOW (ref 41.0–53.0)
HEMATOCRIT: 26 % — ABNORMAL LOW (ref 41.0–53.0)
HEMATOCRIT: 25.9 % — ABNORMAL LOW (ref 41.0–53.0)
HEMATOCRIT: 26 % — ABNORMAL LOW (ref 41.0–53.0)
HEMOGLOBIN: 8.7 g/dL — ABNORMAL LOW (ref 13.5–17.5)
MEAN CORPUSCULAR HEMOGLOBIN CONC: 32.9 g/dL (ref 31.0–37.0)
MEAN CORPUSCULAR HEMOGLOBIN CONC: 34.4 g/dL (ref 31.0–37.0)
MEAN CORPUSCULAR HEMOGLOBIN CONC: 34.2 g/dL (ref 31.0–37.0)
MEAN CORPUSCULAR HEMOGLOBIN CONC: 33.5 g/dL (ref 31.0–37.0)
MEAN CORPUSCULAR HEMOGLOBIN: 30.3 pg (ref 26.0–34.0)
MEAN CORPUSCULAR HEMOGLOBIN: 31.6 pg (ref 26.0–34.0)
MEAN CORPUSCULAR HEMOGLOBIN: 31.2 pg (ref 26.0–34.0)
MEAN CORPUSCULAR HEMOGLOBIN: 30.7 pg (ref 26.0–34.0)
MEAN CORPUSCULAR VOLUME: 92.1 fL (ref 80.0–100.0)
MEAN CORPUSCULAR VOLUME: 91.2 fL (ref 80.0–100.0)
MEAN PLATELET VOLUME: 9 fL (ref 7.0–10.0)
MEAN PLATELET VOLUME: 8.4 fL (ref 7.0–10.0)
MEAN PLATELET VOLUME: 9 fL (ref 7.0–10.0)
PLATELET COUNT: 104 10*9/L — ABNORMAL LOW (ref 150–440)
PLATELET COUNT: 111 10*9/L — ABNORMAL LOW (ref 150–440)
PLATELET COUNT: 99 10*9/L — ABNORMAL LOW (ref 150–440)
PLATELET COUNT: 112 10*9/L — ABNORMAL LOW (ref 150–440)
RED BLOOD CELL COUNT: 2.33 10*12/L — ABNORMAL LOW (ref 4.50–5.90)
RED BLOOD CELL COUNT: 2.83 10*12/L — ABNORMAL LOW (ref 4.50–5.90)
RED BLOOD CELL COUNT: 2.84 10*12/L — ABNORMAL LOW (ref 4.50–5.90)
RED BLOOD CELL COUNT: 2.83 10*12/L — ABNORMAL LOW (ref 4.50–5.90)
RED CELL DISTRIBUTION WIDTH: 22.4 % — ABNORMAL HIGH (ref 12.0–15.0)
RED CELL DISTRIBUTION WIDTH: 23 % — ABNORMAL HIGH (ref 12.0–15.0)
RED CELL DISTRIBUTION WIDTH: 23 % — ABNORMAL HIGH (ref 12.0–15.0)
WBC ADJUSTED: 4.5 10*9/L (ref 4.5–11.0)
WBC ADJUSTED: 4.2 10*9/L — ABNORMAL LOW (ref 4.5–11.0)
WBC ADJUSTED: 3.6 10*9/L — ABNORMAL LOW (ref 4.5–11.0)
WBC ADJUSTED: 4.1 10*9/L — ABNORMAL LOW (ref 4.5–11.0)

## 2016-11-21 LAB — SODIUM URINE: Lab: 151

## 2016-11-21 LAB — CMV DNA, QUANTITATIVE, PCR: CMV VIRAL LD: NOT DETECTED

## 2016-11-21 LAB — PREALBUMIN: Prealbumin:MCnc:Pt:Ser/Plas:Qn:: 12.9 — ABNORMAL LOW

## 2016-11-21 LAB — AST (SGOT): Aspartate aminotransferase:CCnc:Pt:Ser/Plas:Qn:: 18 — ABNORMAL LOW

## 2016-11-21 LAB — INR: Lab: 2.76

## 2016-11-21 LAB — MAGNESIUM
Magnesium:MCnc:Pt:Ser/Plas:Qn:: 1.1 — ABNORMAL LOW
Magnesium:MCnc:Pt:Ser/Plas:Qn:: 2.1

## 2016-11-21 LAB — HEMOGLOBIN
Lab: 7 — ABNORMAL LOW
Lab: 8.9 — ABNORMAL LOW

## 2016-11-21 LAB — CYCLOSPORINE (FPIA) BLOOD: Lab: 123

## 2016-11-21 LAB — GAMMA GLUTAMYL TRANSFERASE: Gamma glutamyl transferase:CCnc:Pt:Ser/Plas:Qn:: 149 — ABNORMAL HIGH

## 2016-11-21 LAB — PROTIME-INR: INR: 2.76

## 2016-11-21 LAB — DIGOXIN LEVEL: Digoxin:MCnc:Pt:Ser/Plas:Qn:: 0.7 — ABNORMAL LOW

## 2016-11-21 LAB — RED BLOOD CELL COUNT: Lab: 2.84 — ABNORMAL LOW

## 2016-11-21 LAB — CMV VIRAL LD: Lab: NOT DETECTED

## 2016-11-21 LAB — BUN / CREAT RATIO: Urea nitrogen/Creatinine:MRto:Pt:Ser/Plas:Qn:: 23

## 2016-11-21 LAB — HEMOGLOBIN AND HEMATOCRIT, BLOOD: HEMATOCRIT: 19.7 % — ABNORMAL LOW (ref 41.0–53.0)

## 2016-11-21 LAB — BLOOD UREA NITROGEN: Urea nitrogen:MCnc:Pt:Ser/Plas:Qn:: 18

## 2016-11-21 NOTE — Unmapped (Signed)
Problem: Patient Care Overview  Goal: Plan of Care Review  Outcome: Progressing  CAM positive pt, Pt RR from 5West. Pt hemodynamically unstable upon arrival. Pt afebrile. HR in the 100's with multiple ectpy, A-fib, Ventricular bigeminy, paced and PVC's. All palpable pulses. Pt finished 1st unit of PRBC's and received one more in SICU. Hand H closely monitored   11/20/16 1859   OTHER   Plan of Care Reviewed With patient;family   Plan of Care Review   Progress improving   . Pt was intubated PRVC and had GI procedure which he tolerated well. Pt extubated with afternoon and now on 4L [place]. A line placed at bedside. Foley placed with very low output. MD made aware through shift. Pt was started on IV fluids and of Albumin. Patient was bathed and turned every two hours. Pt had two large melena (and bright blood) stools upon arrival. Family visited and was updated at the bedside. Pt has remained free of falls. Bed in a low locked position. Will continue to monitor.     Problem: Fall Risk (Adult)  Intervention: Monitor/Assist with Self Care   11/14/16 1102 11/19/16 1058   Interventions   Activity Assistance Provided --  assistance, 1 person   Self-Care Promotion independence encouraged --      Intervention: Reduce Risk/Promote Restraint Free Environment   09/28/16 1647 11/20/16 0600   Interventions   Environmental Safety Modification clutter free environment maintained;lighting adjusted;room near unit station --    Safety Interventions --  fall reduction program maintained   Safety/Security Measures bed alarm set --      Intervention: Review Medications/Identify Contributors to Fall Risk   11/13/16 2237   Interventions   Medication Review/Management medications reviewed;high risk medications identified       Goal: Identify Related Risk Factors and Signs and Symptoms  Related risk factors and signs and symptoms are identified upon initiation of Human Response Clinical Practice Guideline (CPG).   Outcome: Progressing 11/08/16 1709   Fall Risk (Adult)   Related Risk Factors (Fall Risk) gait/mobility problems;environment unfamiliar;fatigue/slow reaction   Signs and Symptoms (Fall Risk) presence of risk factors     Goal: Absence of Fall  Patient will demonstrate the desired outcomes by discharge/transition of care.   Outcome: Progressing   11/12/16 2258   Fall Risk (Adult)   Absence of Fall making progress toward outcome       Problem: Skin Injury Risk (Adult)  Intervention: Promote/Optimize Nutrition   10/12/16 0000   Interventions   Oral Nutrition Promotion calorie dense liquids provided     Intervention: Prevent/Manage Excess Moisture   11/19/16 0800 11/19/16 1100   Interventions   Perineal Care --  absorbent pad changed;perineum cleansed   Bath/Shower --  bath, complete;dressed/undressed;linen changed   Skin Protection sacral silicone foam dressing in place --      Intervention: Maintain Head of Bed Elevation Less Than 30 Degrees as Tolerated   11/20/16 1859   Interventions   Head of Bed (HOB) HOB at 30 degrees     Intervention: Prevent/Minimize Shear/Friction Injuries   11/19/16 1700   Interventions   Positioning/Transfer Devices pillows   Pressure Reduction Devices pressure-redistributing mattress utilized     Intervention: Turn/Reposition Often   11/19/16 1700 11/20/16 0400   Interventions   Pressure Reduction Techniques frequent weight shift encouraged --    Positioning for Skin --  Right       Goal: Identify Related Risk Factors and Signs and Symptoms  Related risk factors  and signs and symptoms are identified upon initiation of Human Response Clinical Practice Guideline (CPG).   Outcome: Progressing   11/12/16 2258   Skin Injury Risk (Adult)   Related Risk Factors (Skin Injury Risk) hospitalization prolonged;mobility impaired     Goal: Skin Health and Integrity  Patient will demonstrate the desired outcomes by discharge/transition of care.   Outcome: Progressing   11/13/16 2237   Skin Injury Risk (Adult)   Skin Health and Integrity making progress toward outcome       Problem: Pain, Acute (Adult)  Intervention: Monitor and Manage Analgesia   09/28/16 1647 11/13/16 2237   Interventions   Bowel Intervention privacy promoted;adequate fluid intake promoted --    Medication Review/Management --  medications reviewed;high risk medications identified       Goal: Identify Related Risk Factors and Signs and Symptoms  Related risk factors and signs and symptoms are identified upon initiation of Human Response Clinical Practice Guideline (CPG).   Outcome: Progressing   11/02/16 0624 11/08/16 1709   Pain, Acute (Adult)   Related Risk Factors (Acute Pain) communication barrier;disease process;patient perception --    Signs and Symptoms (Acute Pain) --  facial mask of pain/grimace;verbalization of pain descriptors     Goal: Acceptable Pain Control/Comfort Level  Patient will demonstrate the desired outcomes by discharge/transition of care.   Outcome: Progressing   11/13/16 2237   Pain, Acute (Adult)   Acceptable Pain Control/Comfort Level making progress toward outcome       Problem: VTE, DVT and PE (Adult)  Intervention: Prevent/Manage Thrombi/Emboli   11/15/16 0900 11/19/16 0800 11/20/16 0600   Interventions   VTE Prevention/Management ambulation promoted --  --    Bleeding Precautions --  monitored for signs of bleeding --    Anti-Embolism Device Type --  --  SCD, Knee   Anti-Embolism Device Location --  --  BLE   Anti-Embolism Intervention --  --  On       Goal: Signs and Symptoms of Listed Potential Problems Will be Absent, Minimized or Managed (VTE, DVT and PE)  Signs and symptoms of listed potential problems will be absent, minimized or managed by discharge/transition of care (reference VTE, DVT and PE (Adult) CPG).   Outcome: Progressing   11/02/16 0624   VTE, DVT and PE (Adult)   Problems Assessed (VTE, DVT, PE) all   Problems Present (VTE, DVT, PE) none       Problem: Confusion, Acute (Adult)  Intervention: Reduce Risk/Promote Restraint Free Environment   09/28/16 1647 11/20/16 0600   Interventions   Environmental Safety Modification clutter free environment maintained;lighting adjusted;room near unit station --    Safety Interventions --  fall reduction program maintained   Safety/Security Measures bed alarm set --      Intervention: Evaluate Medications/Identify Contributors to Confusion   11/13/16 2237   Interventions   Medication Review/Management medications reviewed;high risk medications identified     Intervention: Optimize Communication   11/13/16 1955   Interventions   Communication Enhancement Strategies call light answered in person;written communication utilized       Goal: Identify Related Risk Factors and Signs and Symptoms  Related risk factors and signs and symptoms are identified upon initiation of Human Response Clinical Practice Guideline (CPG).   Outcome: Progressing   11/13/16 2237   Confusion, Acute (Adult)   Related Risk Factors (Acute Confusion) sleep disturbance;polypharmacy;pain;mobility decreased     Goal: Cognitive/Functional Impairments Minimized  Patient will demonstrate the desired outcomes by discharge/transition of care.  Outcome: Progressing   11/12/16 2258   Confusion, Acute (Adult)   Cognitive/Functional Impairments Minimized making progress toward outcome     Goal: Safety  Patient will demonstrate the desired outcomes by discharge/transition of care.   Outcome: Progressing   11/12/16 2258   Confusion, Acute (Adult)   Safety making progress toward outcome

## 2016-11-21 NOTE — Unmapped (Signed)
Problem: Patient Care Overview  Goal: Plan of Care Review  Outcome: Not Progressing  Patient afebrile with stable vitals. No signs of active bleeding. Patient drowsy. Patient wakes easily and follows commands but is confused. Oxygen via nasal cannula ar 2-4 L/min during shift. Peg tub to gravity. Draining very little. Clear with black flecks. Foley to gravity with large amounts of urine. Accuchecks q 6 hours. Patient turned q 2 hours. Bilateral scds in place. Continue to monitor.    Problem: Fall Risk (Adult)  Goal: Identify Related Risk Factors and Signs and Symptoms  Related risk factors and signs and symptoms are identified upon initiation of Human Response Clinical Practice Guideline (CPG).   Outcome: Not Progressing      Problem: Pain, Acute (Adult)  Goal: Identify Related Risk Factors and Signs and Symptoms  Related risk factors and signs and symptoms are identified upon initiation of Human Response Clinical Practice Guideline (CPG).   Outcome: Progressing      Problem: VTE, DVT and PE (Adult)  Goal: Signs and Symptoms of Listed Potential Problems Will be Absent, Minimized or Managed (VTE, DVT and PE)  Signs and symptoms of listed potential problems will be absent, minimized or managed by discharge/transition of care (reference VTE, DVT and PE (Adult) CPG).   Outcome: Not Progressing      Problem: Confusion, Acute (Adult)  Goal: Identify Related Risk Factors and Signs and Symptoms  Related risk factors and signs and symptoms are identified upon initiation of Human Response Clinical Practice Guideline (CPG).   Outcome: Not Progressing

## 2016-11-21 NOTE — Unmapped (Signed)
Adult Nutrition Follow Up    Pt is a 66 yo male s/p liver transplant 7/5    ASSESSMENT  Events since Last RD Visit: Pt now in SICU. Bled from G-tube yesterday morning, subsequently experienced SOB and code blue was called for intubation. Since extubated w/ no signs of active bleeding. Trickles of Nepro restarted via PEG; advancing as tolerated to previous goal rate of 60 mL/hr.     Labs: Reviewed; Mg 1.1, Phos 2.4, glucose POC 126-278 x 24 hrs  Meds: Reviewed; mag sulfate, insulin regimen, protonix, KLOR-CON, KPhos, thiamine, LR @ 75  Abd/GI: last BM 9/09 x 2, 30 mL PEG output  Skin: see below  Patient Lines/Drains/Airways Status    Active Wounds     None                   Nutrition Orders            None        Anthropometric Data:  -- Height: 157 cm (5' 1.81)   -- Last recorded weight: 70.1 kg (154 lb 8.7 oz)  -- Admission weight: 76.7 kg  -- UBW: 155 lbs (70.4 kg)  -- IBW: 53.1 kg  -- Percent IBW: ~105%  -- AdjIBW: N/A  -- BMI: Body mass index is 28.44 kg/m??.   -- Weight changes this admission: -0.3L since 11/07/16 - down from UBW ~155 lbs   Last 5 Recorded Weights    11/12/16 0828 11/13/16 0926 11/15/16 0812 11/16/16 1117   Weight: 70.1 kg (154 lb 9.6 oz) 71.5 kg (157 lb 10.1 oz) 73.6 kg (162 lb 4.8 oz) 70.1 kg (154 lb 9.6 oz)    11/17/16 0826   Weight: 70.1 kg (154 lb 8.7 oz)      -- Weight history PTA: per EPIC wt hx, pt has experienced ~8.4% wt loss x ~6 months. Wife reports loss some was intentional after changing dietary behaviors.  Wt Readings from Last 10 Encounters:   11/17/16 70.1 kg (154 lb 8.7 oz)   10/08/16 73.5 kg (162 lb 0.6 oz)   09/06/16 75.2 kg (165 lb 12.6 oz)   08/02/16 71.1 kg (156 lb 12.8 oz)   07/27/16 70.2 kg (154 lb 11.2 oz)   07/05/16 71.7 kg (158 lb)   05/31/16 76 kg (167 lb 8 oz)   05/26/16 76.2 kg (167 lb 15.9 oz)   05/26/16 75.3 kg (166 lb)   04/08/16 77.8 kg (171 lb 8 oz)     Nutrition Focused Physical Exam:  Fat Areas Examined  Orbital: Mild/moderate loss  Upper Arm: Mild/moderate loss      Muscle Areas Examined  Temple: Severe loss  Clavicle: Severe loss  Acromion: Severe loss  Scapular: Severe loss  Anterior Thigh: Mild/moderate loss  Posterior Calf: Mild/moderate loss              Nutrition Evaluation  Overall Impressions: Moderate fat loss;Severe muscle loss (10/25/16 1503)    Estimated Nutrition Needs:   2097-2415 kcals/day (30-35 kcal/kg IBW) compared to 28-32 kcal/kg actual BW  82-103 g protein/day (1.2-1.5 g/kg IBW)  Fluids per MD    Nutrition Assessment: pt continues to benefit from TFs to meet ~100% of needs. Agree with continuous feeds in order to assess tolerance via PEG. Suspect Phos low 2/2 not receiving feeds at goal since yesterday. Will CTM and adjust EN regimen PRN.    Patient would benefit from nutrition education on food safety post-transplant.      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 (10/25/16 1508)        Fat Loss: Moderate  Muscle Loss: Severe             Nutrition Diagnosis:   Inadequate oral intake related to peri-op period as evidenced by NPO - ongoing  Food and nutrition-related knowledge deficit as related to new liver transplant as evidenced by need for nutrition education prior to discharge - ongoing    Nutrition Goals: diet advancement - not met, basic understanding of nutrition education topic principle of food safety post liver transplant - not met, tube feeds at goal w/in 1 week of ICU stay - met, weight maintenance - ongoing    Nutrition Action/Recommendations/Interventions:   1. Monitor NPO status, nutrition therapy advancement and tolerance  2. Continue trickle feeds of Reduced Lytes/Fluid (Nepro), advancing as tolerated to previous goal rate of 60 mL/hr  --- at goal, order will provide 1260 mL, 2268 kcal, 102 g PRO, 202 g CHO, 919 mL free water w/ additional FWF per team    Eventual bolus regimen:  --- start with 120 mL, run over pump x 1 hr   --- increase bolus by 30 mL q4 hrs   --- final goal: 1 can (237 mL) 5x daily (6a, 10a, 2p, 6p, 10p)   At goal, order will provide 1185 mL, 2133 kcal, 96 g PRO, 190  g CHO,  865 mL free water    3. Will educate pt and caregiver on food safety prior to discharge  4. Request twice weekly weights  5. Suggest MVI w/ minerals until tolerating TFs at goal    Follow-up: 1-2 times per week (and more frequent as indicated)     Jackqulyn Livings MPH, RD, LDN  Pager: [phone]

## 2016-11-21 NOTE — Unmapped (Signed)
Endocrinology Consult Note    Requesting Attending Physician :  Doyce Loose,*  Service Requesting Consult : Surg Transplant [facility])  Primary Care Provider: [name], MD  Outpatient Endocrinologist: N/A    Assessment/Recommendations:    66 yo male with h/o DM2 who s/p liver transplant. Endo consulted for management of DM in the setting of recent initiation of prednisone and tacrolimus and now on 14h tube feeds.     1. Type 2 diabetes, uncontrolled:  - decrease NPH to 6u BID (do not hold if NPO; dose this morning was held with rationale BG 76, POCT not documented yet but venous BG was 143 at 0501; expect BG to trend up today as a result of dose being held)  - would stop nutritional dose of regular insulin 4u Q6hrs since off TF  - Continue STANDARD regular insulin Q6hrs.        -Advise of any changes to steroid dose or to diet    2. Nutrition:  Variable,thus complicating overall glycemic control. Currently NPO.    3. Potential for amio induced thyroid dysfunction  Amio started 8/14  -Baseline TSH checked: 4.0  -Check TFT q6 months      Thank you for the consult. Team paged as pt in SICU. We will continue to follow patient as needed. Please call/page [phone] with questions or concerns.         History of Present Illness: :    Reason for Consult:   DM2, exacerbated by steroids and calcineurin inhibitor    Jeffrey Ward is a 66 y.o. male w/ h/o DM2, Afib, pulmonary HTN, HCV cirrhosis admitted for S/P liver transplant (CMS-HCC) on 7/5. I have been asked to evaluate Jeffrey Ward for DM management. Post-op course was complicated by volume overload thought to be due to CHF exacerbation, respiratory failure now trached and on vent, and AKI requiring CRRT. A1c was 8.0 prior to surgery. He is on prednisone 2.5 mg qd since surgery, along with tacrolimus. He was transitioned from insulin gtt to sq POD1, while still NPO. NPH 15u BID achieved BG 130-200 at that time. However, once he started tube feeds, glucoses rose to 300s so he was switched back to insulin gtt on 7/18. He is currently on continuous tube feeds at goal rate, comprising 324 gm CHO per day.     Interval history: Intubated and extubated recently after rapid response. TF were held for GI bleed. Transferred to ICU.    All Medications:     Current Facility-Administered Medications   Medication Dose Route Frequency Provider Last Rate Last Dose   ??? acetaminophen (TYLENOL) solution 650 mg  650 mg PEG Tube Q4H PRN Lysle Rubens, MD   650 mg at 11/18/16 2327   ??? amiodarone (PACERONE) tablet 200 mg  200 mg PEG Tube Daily Lysle Rubens, MD   200 mg at 11/21/16 4540   ??? cycloSPORINE (NEORAL) microemulsion solution 200 mg  200 mg PEG Tube BID Lysle Rubens, MD   200 mg at 11/21/16 0615   ??? dextrose 50 % solution 12.5 g  12.5 g Intravenous Q10 Min PRN Jonell Cluck, MD   12.5 g at 10/29/16 1145   ??? digoxin (LANOXIN) tablet 62.5 mcg  62.5 mcg PEG Tube Daily Lysle Rubens, MD   62.5 mcg at 11/21/16 0840   ??? hydrALAZINE (APRESOLINE) injection 10 mg  10 mg Intravenous Q6H PRN Jonell Cluck, MD       ??? insulin NPH (HumuLIN,NovoLIN) injection 8 Units  8  Units Subcutaneous BID Denzil Magnuson, MD   8 Units at 11/20/16 1850   ??? insulin regular (HumuLIN,NovoLIN) injection 0-12 Units  0-12 Units Subcutaneous [facility] Denzil Magnuson, MD   6 Units at 11/20/16 1254   ??? insulin regular (HumuLIN,NovoLIN) injection 4 Units  4 Units Subcutaneous Q6H [facility] Denzil Magnuson, MD   Stopped at 11/20/16 1219   ??? ipratropium (ATROVENT) 0.02 % nebulizer solution 500 mcg  500 mcg Nebulization Q6H PRN Jonell Cluck, MD   500 mcg at 11/15/16 2008   ??? labetalol (NORMODYNE,TRANDATE) injection 20 mg  20 mg Intravenous Q6H PRN Jonell Cluck, MD   20 mg at 11/05/16 1752   ??? lactated Ringers infusion  75 mL/hr Intravenous Continuous Dominga Ferry, MD 75 mL/hr at 11/21/16 0800 75 mL/hr at 11/21/16 0800   ??? lidocaine (LIDODERM) 5 % patch 1 patch  1 patch Transdermal Daily Julio Alm, MD   1 patch at 11/21/16 0839   ??? magnesium sulfate 2gm/28mL IVPB  2 g Intravenous Q2H Antony Haste, MD 25 mL/hr at 11/21/16 0832 2 g at 11/21/16 1610   ??? melatonin tablet 3 mg  3 mg PEG Tube Nightly Lysle Rubens, MD   3 mg at 11/20/16 2101   ??? mycophenolate (CELLCEPT) 200 mg/mL suspension 500 mg  500 mg NG tube BID Lysle Rubens, MD   500 mg at 11/21/16 0615   ??? ocular lubricant (GENTEAL SEVERE) 0.3 % ophthalmic gel 1 drop  1 drop Both Eyes TID PRN Berenda Morale, MD   1 drop at 09/30/16 1619   ??? OLANZapine (ZYPREXA) tablet 2.5 mg  2.5 mg PEG Tube BID PRN Lysle Rubens, MD       ??? ondansetron (ZOFRAN-ODT) disintegrating tablet 4 mg  4 mg Oral Q6H PRN Lysle Rubens, MD       ??? pantoprazole (PROTONIX) injection 40 mg  40 mg Intravenous BID Antony Haste, MD       ??? phytonadione (vitamin K1) (AQUA-MEPHYTON) 10 mg in sodium chloride (NS) 0.9% 50 mL IVPB  10 mg Intravenous Once Donalee Citrin, MD   Stopped at 11/20/16 1000   ??? potassium phosphate 30 mmol in sodium chloride (NS) 0.9% 250 mL infusion  30 mmol Intravenous Once Antony Haste, MD 71.3 mL/hr at 11/21/16 0829 30 mmol at 11/21/16 0829   ??? sildenafil (antihypertensive) (REVATIO) 10 mg/mL oral suspension 30 mg  30 mg PEG Tube [facility] Lysle Rubens, MD   30 mg at 11/21/16 0615   ??? sulfamethoxazole-trimethoprim (BACTRIM,SEPTRA) 200-40 mg/5 mL suspension 10 mL  10 mL PEG Tube Once per day on Mon Wed Fri Lysle Rubens, MD   10 mL at 11/21/16 9604   ??? thiamine (B-1) tablet 100 mg  100 mg PEG Tube Daily Lysle Rubens, MD   100 mg at 11/21/16 0839   ??? traMADol (ULTRAM) tablet 50 mg  50 mg PEG Tube Q6H PRN Lysle Rubens, MD   50 mg at 11/19/16 1500   ??? valGANciclovir (VALCYTE) 50 mg/mL oral solution 450 mg  450 mg PEG Tube Daily Lysle Rubens, MD   450 mg at 11/21/16 0840     Review of Systems:    Unable to complete due to patient factors.    Objective: :    Patient Vitals for the past 8 hrs:   Temp Temp src Pulse SpO2 Pulse Resp SpO2 11/21/16 0900 37.2 ??C (99 ??F) Oral 96 93 22 95 %   11/21/16 0845 - -  94 94 14 97 %   11/21/16 0840 - - 96 - - -   11/21/16 0830 37.2 ??C (99 ??F) Oral 93 96 15 99 %   11/21/16 0822 37.3 ??C (99.1 ??F) Oral 93 96 22 98 %   11/21/16 0800 - - 86 85 19 97 %   11/21/16 0700 - - 93 92 21 100 %   11/21/16 0600 - - 95 97 21 96 %   11/21/16 0500 - - 91 - 18 -   11/21/16 0400 36.8 ??C (98.2 ??F) Oral 93 90 18 100 %       I/O this shift:  In: 170 [I.V.:170]  Out: 260 [Urine:260]    Physical Exam:  General appearance - NAD, lying in bed  ENT- NGT in place  Resp - Trach collar,  no increased WOB  Neuro - sleeping  Psych- normal affect    Test Results    Glucose POC all reviewed.        Data Review  Lab Results   Component Value Date    A1C 8.0 (H) 09/14/2016    A1C 7.8 (H) 09/05/2016    A1C 8.5 (H) 07/20/2016     Lab Results   Component Value Date    GFR >= 60 02/15/2012    CREATININE 0.93 11/21/2016

## 2016-11-21 NOTE — Unmapped (Signed)
ICU Progress Note    Assessment/Plan:   Jeffrey Ward is a 66 y.o. male with a past medical history IDDM, GERD, prior perioperative a-fib with RVR and cirrhosis secondary to chronic hepatitis C and HCC s/p liver transplantation on 09/15/16. Decompensation post-op 2/2 acute R Heart failure w/ subsequent PAH, now s/p pacemaker placement. +AKI required CRRT (now resolved). Presents to SICU 9/9 with bleeding per g-tube s/p EGD 9/9 w/ GE junction ulceration.    Interval History:  - EGD performed demonstrated GE junction ulceration, treated w/ clip and epinephrine. G-tube appeared healthy and in place  - Hgb stable post-procedure  - oliguric yesterday evening, s/p 500 albumin bolus and foley repositioning, now with high UOP.     Hospital Day: 18      Principal Problem:    S/P liver transplant (CMS-HCC)  Active Problems:    Atrial fibrillation with RVR (CMS-HCC)    Pulmonary hypertension, moderate to severe (CMS-HCC)    Acute right heart failure (CMS-HCC)    Tracheostomy dependent (CMS-HCC)    Mixed level of activity delirium due to multiple etiologies, persistent      Neurological:   Pain  -Tylenol 650 PRN, tramadol 50mg  q6 PRN  -Fentanyl gtt  ??  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  *Previous R Heart failure  -Stable  ??  *Afib (w/ RVR)  -Amiodarone200mg  (PEG) /digoxin 62.    Gastrointestinal:  F: 75cc/hr LR   E: replete lytes as needed, hypokalemic, hypomagnesemia, hypophosphatemia   N: NPO, holding tube feeds  - Thiamine   ??  *S/P liver transplant (HCC, cirrhosis)  - Liver biopsy 8/24- no acute rejection  - PEG 9/6  ??  *GIB (hematemesis)  -GI consulted-  Scope: minor active bleeding at GE jxn ulcer - epi injected, one clip.  -Protonix IV BID  - Hgb stable, trend H/HC     Renal:  - Oliguric, now with high urine output that is normalizing, continue to follow  - Cr normalized  - C/w foley for I+Os    Infectious disease:  *Immunosuppression s/p Liver tsplx  - Tapered methylprednisolone  - Tacrolimus to Cyclosporine (tremor improvement)  - MMF  -Bactrim/Valcyte PPx     Heme/Coag:   - H/H stable after EGB   - 1u RBC this am  - Trend CBC  ??  *Anticoagulation for Afib  - Holding in setting of GI bleed    Endocrine:   *IDDM  -SSI standard     Daily Care Checklist:            Stress Ulcer Prevention:Yes, Occult gastrointestinal bleeding           DVT Prophylaxis: Chemical:  No:  active bleeding. and Mechanical: Yes.           HOB > 30 degrees: yes             Continue urinary catheter for: yes  strict intake and output    Activity:   OOB in Chair    Advanced Care Planning:  Full Code    Disposition:  Continue ICU care.      Objective  Vitals Reviewed:    Temp:  [36.6 ??C-37.3 ??C] 37.2 ??C  Heart Rate:  [59-121] 96  SpO2 Pulse:  [72-108] 93  Resp:  [  0-24] 22  BP: (106-162)/(46-122) 141/122  MAP (mmHg):  [65-126] 126  A BP-1: (119-186)/(51-93) 178/73  MAP:  [46 mmHg-114 mmHg] 108 mmHg  FiO2 (%):  [40 %] 40 %  SpO2:  [92 %-100 %] 95 %   Temp (24hrs), Avg:36.9 ??C, Min:36.6 ??C, Max:37.3 ??C     SpO2: 95 %   Height: 157 cm (5' 1.81)    Weight: 70.1 kg (154 lb 8.7 oz)    Body mass index is 28.44 kg/m??.    Body surface area is 1.75 meters squared.     Intake/Output Summary (Last 24 hours) at 11/21/16 0931  Last data filed at 11/21/16 0800   Gross per 24 hour   Intake          2680.57 ml   Output             4932 ml   Net         -2251.43 ml        I/O last 3 completed shifts:  In: 3050.6 [I.V.:1470.6; Blood:300; NG/GT:780; IV Piggyback:500]  Out: 1610 [RUEAV:4098; Emesis/NG output:30]   I/O this shift:  In: 170 [I.V.:170]  Out: -     Physical Exam:  General: Well-appearing and in no acute distress  CV: Regular rate and rhythm, no murmurs, rubs or gallops  Pulm: Normal respiratory effort on 2L [place]  Abdomen: Soft, non-tender and non distended. Incisions clean, dry and well-approximated. G-tube in place without surrounding erythema with minimal dark output.   Extremities: Warm and well-perfused  Neuro: Grossly normal motor and sensory function      Continuous Infusions:   ??? lactated Ringers 75 mL/hr (11/21/16 0800)         Hemodynamic/Invasive Device Data (24 hrs):  A BP-1: (119-186)/(51-93) 178/73  MAP:  [46 mmHg-114 mmHg] 108 mmHg    Ventilation/Oxygen Therapy (24hrs):  Vent Mode: PSV  S RR:  [16] 16  FiO2 (%):  [40 %] 40 %  S VT:  [450 mL] 450 mL  PR SUP:  [10 cm H20] 10 cm H20  O2 Device: Nasal cannula  O2 Flow Rate (L/min):  [2 L/min-4 L/min] 2 L/min          Data Review:     Tubes and Drains:  Urethral Catheter Temperature probe (Active)   Site Assessment Clean;Intact 11/21/2016 12:00 AM   Collection Container Standard drainage bag 11/21/2016 12:00 AM   Securement Method Securing device (Describe) 11/21/2016 12:00 AM   Urinary Catheter Necessity Yes 11/21/2016 12:00 AM   Necessity reviewed with SICU team 11/20/2016  8:00 PM   Indications Critically ill patients requiring continuous monitoring of I&Os (i.e. cardiogenic shock, acute renal failure, therapeutic hypothermia) 11/21/2016 12:00 AM   Output (mL) 400 mL 11/21/2016  6:00 AM   Number of days: 1                   Lab Results   Component Value Date    WBC 4.5 11/21/2016    HGB 7.1 (L) 11/21/2016    HCT 21.5 (L) 11/21/2016    PLT 104 (L) 11/21/2016     Lab Results   Component Value Date    NA 142 11/21/2016    K 3.8 11/21/2016    CL 107 11/21/2016    CO2 31.0 (H) 11/21/2016    BUN 21 11/21/2016    CREATININE 0.93 11/21/2016    CALCIUM 8.6 11/21/2016    MG 1.1 (L) 11/21/2016    PHOS 2.4 (L) 11/21/2016  Xr Chest Portable    Result Date: 09/15/2016  EXAM: CHEST ONE VIEW DATE: 09/15/2016 6:47 PM ACCESSION: [id number] UN DICTATED: 09/15/2016 6:58 PM INTERPRETATION LOCATION: [facility] CLINICAL INDICATION: 66 years old Male with S/P Abdominal Transplant:  Check Line and ETT Placement--  COMPARISON: 09/14/2016 CXR TECHNIQUE: Portable AP view of the chest. CONCLUSIONS: --Patient is rotated. --Endotracheal tube tip lies 5 cm above the carina. --Right IJ CVC tip projects over cavoatrial junction. --No right pneumothorax. The left lung apex is incompletely imaged, and there may actually be a tiny pneumothorax at the left apex. If there is any concern for left-sided pneumothorax, recommend repeat upright radiograph. --New patchy left basilar opacity could represent atelectasis and/or aspiration/infection.      Xr Chest 2 Views    Result Date: 09/15/2016  EXAM: CHEST TWO VIEW DATE: 09/14/2016 10:51 PM ACCESSION: [id number] UN DICTATED: 09/14/2016 11:09 PM INTERPRETATION LOCATION: [facility] CLINICAL INDICATION: 66 years old Male with OTHER-liver transplant eval-  COMPARISON: 09/05/2016 TECHNIQUE: PA and lateral views of the chest. FINDINGS: Normal lung volumes. No focal consolidation, pleural effusion, or pneumothorax. Cardiac mediastinal silhouette is within normal limits. Partially imaged thoracolumbar fixation hardware.      No acute cardiopulmonary process.    Xr Abdomen 1 View    Addendum Date: 09/16/2016    The bowel gas pattern is nonobstructive.    Result Date: 09/16/2016  EXAM: XR ABDOMEN 1 VIEW DATE: 09/15/2016 9:15 PM ACCESSION: [id number] UN DICTATED: 09/16/2016 12:08 PM INTERPRETATION LOCATION: [facility] CLINICAL INDICATION: 66 years old Male with NGT (CATHETER NON VASC FIT \\T \ ADJ)--  COMPARISON: 09/15/2016 abdominal radiograph. TECHNIQUE: Supine view of the abdomen. FINDINGS: Bilateral lung base atelectasis. Sequela of liver transplantation. Right upper quadrant surgical drains are unchanged. Interval advancement of the nonweighted enteric tube with the tip and side-port overlying the stomach. Unchanged staples. Unchanged fixation hardware overlying the spine. Unchanged right upper quadrant catheter which may represent a biliary stent. Postsurgical clips overlying the right upper quadrant. No evidence of acute osseous abnormality.      Interval advancement of the nonweighted enteric tube, now with the tip and side-port overlying the stomach. Otherwise, unchanged support devices compared to the prior.    Xr Abdomen 1 View    Result Date: 09/15/2016  EXAM: XR ABDOMEN 1 VIEW DATE: 09/15/2016 8:01 PM ACCESSION: [id number] UN DICTATED: 09/15/2016 8:12 PM INTERPRETATION LOCATION: [facility] CLINICAL INDICATION: 66 years old Male with NGT (CATHETER NON VASC FIT \\T \ ADJ)--  TECHNIQUE: Limited supine view of the upper abdomen was obtained to evaluate for enteric tube position.  COMPARISON: 07/20/2016 MRI and earlier exams CONCLUSIONS: --Enteric tube tip projects over the gastric body, with side-port likely at the GEJ. Consider 5 cm advancement for optimal positioning. --Sequela of liver transplant. Two surgical drains in the right upper quadrant. [employer] skin staples. Coiled linear density projecting over the mid right hemiabdomen is nonspecific, retained surgical foreign body not excluded.       ATTENDING ATTESTATION:  I was present with resident during the history and exam. I discussed the findings, assessment and plan with the resident and agree with the findings and plan as documented in the resident???s note.  ________________________________

## 2016-11-21 NOTE — Unmapped (Signed)
[facility]   Psychiatry Follow-up Consult Note    Service Date: November 21, 2016  LOS:  LOS: 67 days     Assessment:    Jeffrey Ward is a 66 y.o. male admitted 09/14/2016  9:40 PM for liver transplant (now s/p OLT on 09/15/16).  Psychiatry was consulted by Doyce Loose with Surg Transplant [facility]) for Altered Mental Status/Delirium and Aggression/Agitation. Today, [name] is being seen for medication recommendations following transfer to SICU for GIB.    At this time, the patient's presentation continues to be consistent with resolving delirium. Agree with holding standing Zyprexa in light of recently prolonged QTc (641 ms), but would use PRN if patient demonstrates significant agitation. If repeat EKG demonstrates continued resolution of QTc prolongation, would restart standing Zyprexa.     Safety Assessment:   Given resolving delirium, pt could resonably be expected to have reduced safety awareness, increased impulsivity and may therefore be at elevated risk of self-harm.    Diagnoses:   Active Hospital problems:  Principal Problem:    S/P liver transplant (CMS-HCC)  Active Problems:    Atrial fibrillation with RVR (CMS-HCC)    Pulmonary hypertension, moderate to severe (CMS-HCC)    Acute right heart failure (CMS-HCC)    Tracheostomy dependent (CMS-HCC)    Mixed level of activity delirium due to multiple etiologies, persistent      Recommendations:   -- Safety Recommendations: Given acute delirium, pt could resonably be expected to have reduced safety awareness, increased impulsivity and may therefore be at elevated risk of self-harm.     -- Medication Recommendations:    HOLD Zyprexa 7.5 mg po qhs in light of recently prolonged QTc  Continue Zyprexa 2.5 mg po bid prn insomnia, confusion, anxiety/agitation   Continue thiamine 100 mg po daily   Recommend multivitamin and folate 1 mg po daily    -- Medical Decision Making Capacity: Although not formally assessed, given the alteration in mental status, it is recommended that there be communication with the patient's surrogate decision-maker for complex medical decisions.     -- Further Work-up:   While patient is receiving antipsychotics that may prolong QTc, KEEP Mg>2 and K>4 to minimize risk of torsades  MONITOR QTc.  Please order twice weekly EKGs (or print tele strip) while patient is being actively managed with antipsychotic medications.    -- Disposition: Currently deferred. A more thorough evaluation of disposition will need to occur as the patient's delirium resolves.    -- Behavioral Recommendations: Recommend delirium precautions/protocol.    Thank you for this consult request. Recommendations have been communicated to the primary team.  We will follow as needed at this time. Please page [phone] or [phone] (after hours/weekends) for any questions or concerns.     Discussed with and seen by Attending Torrie Mayers, MD, who agrees with the assessment and plan.     Delila Spence, MD      Interval History   Updates of hospital course since last seen by psychiatry: Transferred to SICU for GIB, now s/p EGD and pRBC transfusions. Extubated.     Patient Interview: Pt awake on approach, smiling. He acknowledges feeling better since transferring to the SICU. He shrugs when asked his mood, but he remains smiling throughout. He acknowledges still not sleeping well but feels that the beds are more comfortable in the SICU than on bed tower. He acknowledges some occasional double vision, but denies AVH. He is eager to be transferred to the floor. Able  to name DoW backwards without difficulty.     ROS:   Gen: positive for Back pain    Current Medications:  Scheduled Meds:  ??? amiodarone  200 mg PEG Tube Daily   ??? cycloSPORINE  200 mg PEG Tube BID   ??? digoxin  62.5 mcg PEG Tube Daily   ??? insulin NPH  8 Units Subcutaneous BID   ??? insulin regular  0-12 Units Subcutaneous Q6H [facility]   ??? insulin regular  4 Units Subcutaneous Q6H [facility]   ??? lidocaine  1 patch Transdermal Daily   ??? melatonin  3 mg PEG Tube Nightly   ??? mycophenolate  500 mg NG tube BID   ??? pantoprazole  40 mg Intravenous BID   ??? phytonadione (AQUA-MEPHYTON) intravenous  10 mg Intravenous Once   ??? sildenafil (antihypertensive)  30 mg PEG Tube [facility]   ??? sulfamethoxazole-trimethoprim  10 mL PEG Tube Once per day on Mon Wed Fri   ??? thiamine  100 mg PEG Tube Daily   ??? valGANciclovir  450 mg PEG Tube Daily     Continuous Infusions:    PRN Meds:.acetaminophen, dextrose 50 % in water (D50W), hydrALAZINE, ipratropium, labetalol, ocular lubricant, OLANZapine, ondansetron, traMADol      Objective   Vitals:  Temp:  [36.8 ??C-37.3 ??C] 37 ??C  Heart Rate:  [74-105] 86  SpO2 Pulse:  [84-106] 86  Resp:  [0-24] 13  BP: (120-162)/(55-122) 141/122  MAP (mmHg):  [76-126] 126  A BP-1: (128-184)/(51-83) 169/71  MAP:  [61 mmHg-198 mmHg] 105 mmHg  FiO2 (%):  [2 %-40 %] 2 %  SpO2:  [92 %-100 %] 100 %    GEN: no acute distress    Mental Status Exam:  Appearance:    Appears stated age, laying in bed   Motor:   No abnormal movements   Speech/Language:    Able to mouth responses    Mood:   Pt shrugged when asked    Affect:   Calm, smiling    Thought process:   Logical, linear, clear, coherent, goal directed   Thought content:     Denies SI. No overt delusions or paranoia noted.    Perceptual disturbances:     Denies AVH. Does not appear to respond to internal stimuli.      Orientation:   Oriented to person, place, year, month, and general circumstances   Attention:  Passes Dow backwards    Concentration:   Able to attend.    Memory:   Immediate, short-term, long-term, and recall grossly intact    Fund of knowledge:    Not formally tested.   Insight:     Fair   Judgment:    Impaired in the setting of resolving delirium   Impulse Control:   Impaired in the setting of resolving delirium      Data Reviewed:  I reviewed labs from the last 24 hours.

## 2016-11-21 NOTE — Unmapped (Signed)
Mr. Abdurahman Rugg record reviewed for [facility] AIR admission.  Per record, Mr. Demery is not appropriate for [facility] at this time due to recent change in medical stability.  Mr. Rumbold has been removed from AIR list at this time. Please contact [place] [facility] to re-refer Mr. Honaker if there is a functional or medical change warranting AIR admission.  Thank you.      Janit Pagan, OTR/L   [facility]  Inpatient Coordinator  Office: [phone]    8:25 AM 11/21/2016

## 2016-11-21 NOTE — Unmapped (Signed)
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 Post-op course complicated by decompensation thought to be secondary to acute right heart failure and AKI requiring CRRT (resolved). 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 Severe PAH diagnosed. Eventually had episodes of a. Fib/tachyarrythmia x3, converted with amiodarone, then with digoxin; Cardiology following closely. On 11/03/16 he had a significant spike in his LFTs suspicious for acute rejection; however liver biopsy from 11/04/16 did not show any evidence of rejection.     Underwent G tube on 11/17/2016.  Hep gtt restarted for bridging to coumadin. Developed GIB on 9/9 and underwent intubation for EGD. Small ulcer at GEJ was injected with epinephrine and clipped.     Subjective/Interval Events:   Hb downtrended to 7.1 this morning. Will receive 1U pRBC with follow up CBC. He remains HDS with excellent UOP > 500 cc/hr. G tube is flushing well with no blood returning.     Plan:     Neuro: Cognition/agitation continuing to improve  -Pain:  ???? -tylenol and tramadol  Psych:   ???? - holding zyprexa for prolonged QTc   ???? - thiamine, melatonin  -No MRI for 6 weeks after pacer placement (7/31)  ??  CV: Currently NSR   Cards following  - permanent pace maker placed 8/31  - Prev R heart failure, a fib/tachyarrythmia  - amiodarone and digoxin for afib with RVR  - Asystolic pauses  - EKG M/Th, last QTc 9/8 was 641 ms  - discontinue arterial line   ??  Pulm: Extubated 9/9, stable on 2L [place]  - de-cannulated 9/7  - Atrovent nebs q6 PRN  - Pulmonary artery hypertension - on sildenafil, off epoprostenol    FEN/GI:   - Liver US: intrahepatic ductal dilation   - Liver biopsy 8/24- no acute rejection  - Failed MBSS s/p PEG 9/6  - PEG Feeds - restart trickle tube feeds at 20 cc/hr, advance q4h to goal   - LR @ 75 cc/hr  - EGD 9/9 (report not yet available)- clotted blood in stomach, no significant blood from around the G tube site, small GEJ ulcer injected with epinephrine and clipped  - Protonix gtt. May transition to IV PPI BID when GI feels he is stable  - Replete Mg/Phos    Renal: AKI resolving, likely pre-renal vs. obstructive vs. combination  -??renally dose meds   - foley inserted 9/9, excellent UOP s/p resuscitation  ??  Endo:   -Endocrine following  -Current recs: decrease NPH 6 U BID (do not hold if NPO), continue scheduled 4U q6h regular insulin (hold if NPO), continue regular SSI  ??  Heme/ID:  - Heparin gtt + coumadin held for GIB for atrial fibrillation  - 2 u pRBC transfused 9/9, Give 1U pRBC. Post-transfusion CBC 7.1 -> 8.9  ??  Immunosuppresion  - 3/3 dose of 500 mg methylprednisone 8/25 and taper. ??  - Tac -> cyclosporine - tremors improved   - continue MMF  ??  PPx:   Bactrim  Valcyte  ??  Dispo: Transfer to floor     Merri Brunette, MD  General Surgery, PGY3  Pager # [phone]      Objective:        Vital Signs:  BP 141/122  - Pulse 96  -  Temp 37.2 ??C (Oral)  - Resp 22  - Ht 157 cm (5' 1.81)  - Wt 70.1 kg (154 lb 8.7 oz)  - SpO2 95%  - BMI 28.44 kg/m??     Input/Output:  I/O last 3 completed shifts:  In: 3050.6 [I.V.:1470.6; Blood:300; NG/GT:780; IV Piggyback:500]  Out: 3244 [WNUUV:2536; Emesis/NG output:30]    Physical Exam:    General: No acute distress  Neuro: Sleeping but awakens, interactive.   Lungs: soft voice, 2L [place], decannulated- site healing well  Abdomen: Incision c/d/i. Soft, non-tender. PEG in place, flushing well, no blood return. No oozing around G tube site.   Ext: [facility]      Lab Results   Component Value Date    WBC 4.2 (L) 11/21/2016    HGB 8.9 (L) 11/21/2016    HCT 26.0 (L) 11/21/2016    PLT 111 (L) 11/21/2016       Lab Results   Component Value Date    NA 142 11/21/2016    K 3.8 11/21/2016    CL 107 11/21/2016    CO2 31.0 (H) 11/21/2016    BUN 21 11/21/2016    CREATININE 0.93 11/21/2016    CALCIUM 8.6 11/21/2016    MG 1.1 (L) 11/21/2016    PHOS 2.4 (L) 11/21/2016     Imaging:  No new imaging

## 2016-11-22 LAB — CBC
HEMATOCRIT: 25.3 % — ABNORMAL LOW (ref 41.0–53.0)
HEMOGLOBIN: 8.5 g/dL — ABNORMAL LOW (ref 13.5–17.5)
MEAN CORPUSCULAR HEMOGLOBIN CONC: 33.8 g/dL (ref 31.0–37.0)
MEAN CORPUSCULAR HEMOGLOBIN: 30.9 pg (ref 26.0–34.0)
MEAN CORPUSCULAR VOLUME: 91.5 fL (ref 80.0–100.0)
MEAN PLATELET VOLUME: 9 fL (ref 7.0–10.0)
PLATELET COUNT: 101 10*9/L — ABNORMAL LOW (ref 150–440)
RED BLOOD CELL COUNT: 2.76 10*12/L — ABNORMAL LOW (ref 4.50–5.90)
WBC ADJUSTED: 3.4 10*9/L — ABNORMAL LOW (ref 4.5–11.0)

## 2016-11-22 LAB — BASIC METABOLIC PANEL
ANION GAP: 5 mmol/L — ABNORMAL LOW (ref 9–15)
BLOOD UREA NITROGEN: 20 mg/dL (ref 7–21)
BUN / CREAT RATIO: 21
CALCIUM: 9 mg/dL (ref 8.5–10.2)
CHLORIDE: 104 mmol/L (ref 98–107)
CO2: 30 mmol/L (ref 22.0–30.0)
CREATININE: 0.94 mg/dL (ref 0.70–1.30)
EGFR MDRD NON AF AMER: >=60 mL/min/{1.73_m2} (ref >=60)
GLUCOSE RANDOM: 117 mg/dL (ref 65–179)
POTASSIUM: 3.7 mmol/L (ref 3.5–5.0)
SODIUM: 139 mmol/L (ref 135–145)

## 2016-11-22 LAB — HEPATIC FUNCTION PANEL
ALBUMIN: 2.7 g/dL — ABNORMAL LOW (ref 3.5–5.0)
AST (SGOT): 19 U/L (ref 19–55)
BILIRUBIN TOTAL: 1.1 mg/dL (ref 0.0–1.2)
PROTEIN TOTAL: 4.8 g/dL — ABNORMAL LOW (ref 6.5–8.3)

## 2016-11-22 LAB — INR: Lab: 2.32

## 2016-11-22 LAB — CHLORIDE: Chloride:SCnc:Pt:Ser/Plas:Qn:: 104

## 2016-11-22 LAB — PROTIME-INR: INR: 2.32

## 2016-11-22 LAB — PHOSPHORUS: Phosphate:MCnc:Pt:Ser/Plas:Qn:: 3.7

## 2016-11-22 LAB — MEAN PLATELET VOLUME: Lab: 9

## 2016-11-22 LAB — BILIRUBIN TOTAL: Bilirubin:MCnc:Pt:Ser/Plas:Qn:: 1.1

## 2016-11-22 LAB — CYCLOSPORINE (FPIA) BLOOD: Lab: 217

## 2016-11-22 LAB — GAMMA GLUTAMYL TRANSFERASE: Gamma glutamyl transferase:CCnc:Pt:Ser/Plas:Qn:: 134 — ABNORMAL HIGH

## 2016-11-22 LAB — MAGNESIUM: Magnesium:MCnc:Pt:Ser/Plas:Qn:: 1.6

## 2016-11-22 NOTE — Unmapped (Signed)
Endocrinology Consult Note    Requesting Attending Physician :  Doyce Loose,*  Service Requesting Consult : Surg Transplant [facility])  Primary Care Provider: [name], MD  Outpatient Endocrinologist: N/A    Assessment/Recommendations:    66 yo male with h/o DM2 who s/p liver transplant. Endo consulted for management of DM in the setting of recent initiation of prednisone and tacrolimus and now on 14h tube feeds.     1. Type 2 diabetes, uncontrolled:  - continue with reduction of NPH to 6u BID (do not hold if NPO). Previously hypoglycemic on 8 units when NPO.  - resume nutritional dose of regular insulin 4u Q6hrs since starting back TF  - Continue STANDARD regular insulin Q6hrs.        -Advise of any changes to steroid dose or to diet    2. Nutrition:  Variable,thus complicating overall glycemic control. Currently NPO but resumed TF. Trickle feeds last night but now at goal rate.    Continue trickle feeds of Reduced Lytes/Fluid (Nepro), advancing as tolerated to previous goal rate of 60 mL/hr  --- at goal, order will provide 1260 mL, 2268 kcal, 102 g PRO, 202 g CHO, 919 mL free water w/ additional FWF per team  ??  Eventual bolus regimen:  --- start with 120 mL, run over pump x 1 hr   --- increase bolus by 30 mL q4 hrs   --- final goal: 1 can (237 mL) 5x daily (6a, 10a, 2p, 6p, 10p)              At goal, order will provide 1185 mL, 2133 kcal, 96 g PRO, 190        g CHO,  865 mL free water      3. Potential for amio induced thyroid dysfunction  Amio started 8/14  -Baseline TSH checked: 4.0  -Check TFT q6 months      Thank you for the consult. Discussed with team. We will continue to follow patient as needed. Please call/page [phone] with questions or concerns.         History of Present Illness: :    Reason for Consult:   DM2, exacerbated by steroids and calcineurin inhibitor    Jeffrey Ward is a 66 y.o. male w/ h/o DM2, Afib, pulmonary HTN, HCV cirrhosis admitted for S/P liver transplant (CMS-HCC) on 7/5. I have been asked to evaluate Jeffrey Ward for DM management. Post-op course was complicated by volume overload thought to be due to CHF exacerbation, respiratory failure now trached and on vent, and AKI requiring CRRT. A1c was 8.0 prior to surgery. He is on prednisone 2.5 mg qd since surgery, along with tacrolimus. He was transitioned from insulin gtt to sq POD1, while still NPO. NPH 15u BID achieved BG 130-200 at that time. However, once he started tube feeds, glucoses rose to 300s so he was switched back to insulin gtt on 7/18. He is currently on continuous tube feeds at goal rate, comprising 324 gm CHO per day.     Interval history: pt hemodynamically stable.  Transferred from ICU to floor. To resume TF.     All Medications:     Current Facility-Administered Medications   Medication Dose Route Frequency Provider Last Rate Last Dose   ??? acetaminophen (TYLENOL) solution 650 mg  650 mg PEG Tube Q4H PRN Lysle Rubens, MD   650 mg at 11/18/16 2327   ??? amiodarone (PACERONE) tablet 200 mg  200 mg PEG Tube Daily Lysle Rubens, MD  200 mg at 11/22/16 0851   ??? cycloSPORINE (NEORAL) microemulsion solution 200 mg  200 mg PEG Tube BID Lysle Rubens, MD   200 mg at 11/22/16 0610   ??? dextrose 50 % solution 12.5 g  12.5 g Intravenous Q10 Min PRN Jonell Cluck, MD   12.5 g at 10/29/16 1145   ??? digoxin (LANOXIN) tablet 62.5 mcg  62.5 mcg PEG Tube Daily Lysle Rubens, MD   62.5 mcg at 11/22/16 0851   ??? hydrALAZINE (APRESOLINE) injection 10 mg  10 mg Intravenous Q6H PRN Jonell Cluck, MD       ??? insulin NPH (HumuLIN,NovoLIN) injection 6 Units  6 Units Subcutaneous BID Trellis Paganini [place], [place]       ??? insulin regular (HumuLIN,NovoLIN) injection 0-12 Units  0-12 Units Subcutaneous Q6H [facility] Denzil Magnuson, MD   2 Units at 11/21/16 1744   ??? insulin regular (HumuLIN,NovoLIN) injection 4 Units  4 Units Subcutaneous Q6H [facility] Denzil Magnuson, MD   4 Units at 11/22/16 0618   ??? ipratropium (ATROVENT) 0.02 % nebulizer solution 500 mcg  500 mcg Nebulization Q6H PRN Jonell Cluck, MD   500 mcg at 11/15/16 2008   ??? labetalol (NORMODYNE,TRANDATE) injection 20 mg  20 mg Intravenous Q6H PRN Jonell Cluck, MD   20 mg at 11/05/16 1752   ??? lidocaine (LIDODERM) 5 % patch 1 patch  1 patch Transdermal Daily Julio Alm, MD   1 patch at 11/21/16 0839   ??? melatonin tablet 3 mg  3 mg PEG Tube Nightly Lysle Rubens, MD   3 mg at 11/21/16 2131   ??? mycophenolate (CELLCEPT) 200 mg/mL suspension 500 mg  500 mg NG tube BID Lysle Rubens, MD   500 mg at 11/22/16 0610   ??? ocular lubricant (GENTEAL SEVERE) 0.3 % ophthalmic gel 1 drop  1 drop Both Eyes TID PRN Berenda Morale, MD   1 drop at 09/30/16 1619   ??? OLANZapine (ZYPREXA) tablet 2.5 mg  2.5 mg PEG Tube BID PRN Lysle Rubens, MD       ??? pantoprazole (PROTONIX) injection 40 mg  40 mg Intravenous BID Antony Haste, MD   40 mg at 11/22/16 0850   ??? sildenafil (antihypertensive) (REVATIO) 10 mg/mL oral suspension 30 mg  30 mg PEG Tube [facility] Lysle Rubens, MD   30 mg at 11/22/16 0610   ??? sulfamethoxazole-trimethoprim (BACTRIM,SEPTRA) 200-40 mg/5 mL suspension 10 mL  10 mL PEG Tube Once per day on Mon Wed Fri Lysle Rubens, MD   10 mL at 11/21/16 9811   ??? thiamine (B-1) tablet 100 mg  100 mg PEG Tube Daily Lysle Rubens, MD   100 mg at 11/22/16 0850   ??? traMADol (ULTRAM) tablet 50 mg  50 mg PEG Tube Q6H PRN Lysle Rubens, MD   50 mg at 11/21/16 2130   ??? valGANciclovir (VALCYTE) 50 mg/mL oral solution 450 mg  450 mg PEG Tube Daily Lysle Rubens, MD   450 mg at 11/22/16 0851   ??? warfarin (COUMADIN) tablet 3 mg  3 mg PEG Tube Daily Trellis Paganini Barrett, PA         Review of Systems:    Unable to complete due to patient factors.    Objective: :    Patient Vitals for the past 8 hrs:   BP Temp Temp src Pulse Resp SpO2 Weight   11/22/16 0941 - - - - - - 67.8 kg (149 lb 7.6 oz)  11/22/16 0738 125/60 35.4 ??C (95.7 ??F) Oral 75 17 100 % -   11/22/16 0400 111/53 36 ??C (96.8 ??F) Oral 84 16 96 % -       I/O this shift:  In: 100 [NG/GT:100]  Out: 300 [Urine:300]    Physical Exam:  General appearance - NAD, lying in bed  ENT- NGT in place  Resp - Trach collar,  no increased WOB  Neuro - sleeping  Psych- normal affect    Test Results    Glucose POC all reviewed.        Data Review  Lab Results   Component Value Date    A1C 8.0 (H) 09/14/2016    A1C 7.8 (H) 09/05/2016    A1C 8.5 (H) 07/20/2016     Lab Results   Component Value Date    GFR >= 60 02/15/2012    CREATININE 0.94 11/22/2016

## 2016-11-22 NOTE — Unmapped (Signed)
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 Post-op course complicated by decompensation thought to be secondary to acute right heart failure and AKI requiring CRRT (resolved). 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 Severe PAH diagnosed. Eventually had episodes of a. Fib/tachyarrythmia x3, converted with amiodarone, then with digoxin; Cardiology following closely. On 11/03/16 he had a significant spike in his LFTs suspicious for acute rejection; however liver biopsy from 11/04/16 did not show any evidence of rejection.     Underwent G tube on 11/17/2016.  Hep gtt restarted for bridging to coumadin. Developed GIB on 9/9 and underwent intubation for EGD. Small ulcer at GEJ was injected with epinephrine and clipped.     Subjective/Interval Events:   NAEO. Hb stable and now on floor from SICU    Plan:     Neuro: Cognition/agitation continuing to improve  -Pain:  ???? -tylenol and tramadol  Psych:   ???? - thiamine, melatonin  ??  CV: Currently NSR   Cards following  - permanent pace maker placed 8/31  - Prev R heart failure, a fib/tachyarrythmia  - amiodarone and digoxin for afib with RVR  - Asystolic pauses  - EKG M/Th, last QTc 9/8 was 547 ms  ??  Pulm: Extubated 9/9, stable on 1.5L [place]  - de-cannulated 9/7  - Atrovent nebs q6 PRN  - Pulmonary artery hypertension - on sildenafil, off epoprostenol    FEN/GI:   - Liver US: intrahepatic ductal dilation   - Liver biopsy 8/24- no acute rejection  - Failed MBSS s/p PEG 9/6  - PEG Feeds - trickle tube feeds at 20 cc/hr, advance q4h to goal   - medlock  - EGD 9/9 (report not yet available)- clotted blood in stomach, no significant blood from around the G tube site, small GEJ ulcer injected with epinephrine and clipped  - GI ok with transitioning IV protonix to PO    Renal: AKI resolving, likely pre-renal vs. obstructive vs. combination  -??renally dose meds   - d/c foley, f/u TOV  ??  Endo:   -Endocrine following  -Current recs: decrease NPH 6 U BID (do not hold if NPO), continue scheduled 4U q6h regular insulin (hold if NPO), continue regular SSI  ??  Heme/ID:  - Heparin gtt held  - Warfarin 3mg  restarted  - 2 u pRBC transfused 9/9, 1/u pRBC 9/11  ??  Immunosuppresion  - 3/3 dose of 500 mg methylprednisone 8/25 and taper. ??  - Tac -> cyclosporine - tremors improved   - continue MMF  ??  PPx:   Bactrim  Valcyte  ??  Dispo: Floor  - PT/OT reconsulted    Lysle Rubens, MD  General Surgery, PGY1  Pager # [phone]      Objective:        Vital Signs:  BP 131/60  - Pulse 80  - Temp 36.1 ??C (Oral)  - Resp 17  - Ht 157 cm (5' 1.81)  - Wt 67.8 kg (149 lb 7.6 oz)  - SpO2 100%  - BMI 27.51 kg/m??     Input/Output:  I/O last 3 completed shifts:  In: 2175 [P.O.:20; I.V.:1230; Blood:325; NG/GT:600]  Out: 7590 [Urine:7560; Emesis/NG output:30]    Physical Exam:    General: No acute distress  Neuro: Sleeping but awakens, interactive.   Lungs: soft voice, 1.5L [place], decannulated- site healing well  Abdomen: Incision c/d/i. Soft, non-tender. PEG in place, flushing well, no blood return. No oozing around G tube site.   Ext: [facility]      Lab Results   Component Value Date    WBC 3.4 (L) 11/22/2016    HGB 8.5 (L) 11/22/2016    HCT 25.3 (L) 11/22/2016    PLT 101 (L) 11/22/2016       Lab Results   Component Value Date    NA 139 11/22/2016    K 3.7 11/22/2016    CL 104 11/22/2016    CO2 30.0 11/22/2016    BUN 20 11/22/2016    CREATININE 0.94 11/22/2016    CALCIUM 9.0 11/22/2016    MG 1.6 11/22/2016    PHOS 3.7 11/22/2016     Imaging:  No new imaging

## 2016-11-22 NOTE — Unmapped (Signed)
[facility], [place], [place]    HEPATOLOGY INPATIENT -- FOLLOW-UP NOTE    Requesting Attending Physician:  Doyce Loose,*    Reason for Consult:  Jeffrey Ward is a 66 y.o. male seen in consultation at the request of Dr. Doyce Loose,* for PEG placement for dysphagia following transplant.    Interval History:  -- PEG tube placed on 11/17/2016, with no immediate complications, other than mild oozing at site of PEG placement.  -- Continued oozing at bedside, upon transfer to the floor, and incision site was sutured by surgery, with hemostasis on 9/6  -- Patient did well, team called early on AM of 9/9 - patient with bright red blood from PEG tube, EGD revealed healthy PEG site, no ulceration at the bumper, and GE function ulcer, which was injected with epinephrine and clipped  -- continues to be hemodynamically stable no further bleeding, tube feeds restarted.    Medications:  Current Facility-Administered Medications   Medication Dose Route Frequency Provider Last Rate Last Dose   ??? acetaminophen (TYLENOL) solution 650 mg  650 mg PEG Tube Q4H PRN Lysle Rubens, MD   650 mg at 11/18/16 2327   ??? amiodarone (PACERONE) tablet 200 mg  200 mg PEG Tube Daily Lysle Rubens, MD   200 mg at 11/21/16 1478   ??? cycloSPORINE (NEORAL) microemulsion solution 200 mg  200 mg PEG Tube BID Lysle Rubens, MD   200 mg at 11/21/16 1743   ??? dextrose 50 % solution 12.5 g  12.5 g Intravenous Q10 Min PRN Jonell Cluck, MD   12.5 g at 10/29/16 1145   ??? digoxin (LANOXIN) tablet 62.5 mcg  62.5 mcg PEG Tube Daily Lysle Rubens, MD   62.5 mcg at 11/21/16 0840   ??? hydrALAZINE (APRESOLINE) injection 10 mg  10 mg Intravenous Q6H PRN Jonell Cluck, MD       ??? insulin NPH (HumuLIN,NovoLIN) injection 8 Units  8 Units Subcutaneous BID Denzil Magnuson, MD   8 Units at 11/21/16 1744   ??? insulin regular (HumuLIN,NovoLIN) injection 0-12 Units  0-12 Units Subcutaneous Q6H [facility] Denzil Magnuson, MD 2 Units at 11/21/16 1744   ??? insulin regular (HumuLIN,NovoLIN) injection 4 Units  4 Units Subcutaneous Q6H [facility] Denzil Magnuson, MD   4 Units at 11/21/16 1754   ??? ipratropium (ATROVENT) 0.02 % nebulizer solution 500 mcg  500 mcg Nebulization Q6H PRN Jonell Cluck, MD   500 mcg at 11/15/16 2008   ??? labetalol (NORMODYNE,TRANDATE) injection 20 mg  20 mg Intravenous Q6H PRN Jonell Cluck, MD   20 mg at 11/05/16 1752   ??? lidocaine (LIDODERM) 5 % patch 1 patch  1 patch Transdermal Daily Julio Alm, MD   1 patch at 11/21/16 0839   ??? melatonin tablet 3 mg  3 mg PEG Tube Nightly Lysle Rubens, MD   3 mg at 11/21/16 2131   ??? mycophenolate (CELLCEPT) 200 mg/mL suspension 500 mg  500 mg NG tube BID Lysle Rubens, MD   500 mg at 11/21/16 1743   ??? ocular lubricant (GENTEAL SEVERE) 0.3 % ophthalmic gel 1 drop  1 drop Both Eyes TID PRN Berenda Morale, MD   1 drop at 09/30/16 1619   ??? OLANZapine (ZYPREXA) tablet 2.5 mg  2.5 mg PEG Tube BID PRN Lysle Rubens, MD       ??? ondansetron (ZOFRAN-ODT) disintegrating tablet 4 mg  4 mg Oral Q6H PRN Lysle Rubens, MD       ???  pantoprazole (PROTONIX) injection 40 mg  40 mg Intravenous BID Antony Haste, MD   40 mg at 11/21/16 2142   ??? phytonadione (vitamin K1) (AQUA-MEPHYTON) 10 mg in sodium chloride (NS) 0.9% 50 mL IVPB  10 mg Intravenous Once Donalee Citrin, MD   Stopped at 11/20/16 1000   ??? sildenafil (antihypertensive) (REVATIO) 10 mg/mL oral suspension 30 mg  30 mg PEG Tube [facility] Lysle Rubens, MD   30 mg at 11/21/16 2131   ??? sulfamethoxazole-trimethoprim (BACTRIM,SEPTRA) 200-40 mg/5 mL suspension 10 mL  10 mL PEG Tube Once per day on Mon Wed Fri Lysle Rubens, MD   10 mL at 11/21/16 2956   ??? thiamine (B-1) tablet 100 mg  100 mg PEG Tube Daily Lysle Rubens, MD   100 mg at 11/21/16 0839   ??? traMADol (ULTRAM) tablet 50 mg  50 mg PEG Tube Q6H PRN Lysle Rubens, MD   50 mg at 11/21/16 2130   ??? valGANciclovir (VALCYTE) 50 mg/mL oral solution 450 mg  450 mg PEG Tube Daily Lysle Rubens, MD   450 mg at 11/21/16 0840       Vital Signs:  Temp:  [35.8 ??C-37.3 ??C] 35.8 ??C  Heart Rate:  [83-96] 84  SpO2 Pulse:  [85-97] 86  Resp:  [8-22] 16  BP: (113)/(57) 113/57  A BP-1: (128-184)/(51-77) 169/71  MAP:  [77 mmHg-198 mmHg] 105 mmHg  FiO2 (%):  [2 %] 2 %  SpO2:  [93 %-100 %] 99 %    Intake/Output last 3 shifts:  I/O last 3 completed shifts:  In: 3135.6 [P.O.:20; I.V.:1640.6; Blood:625; NG/GT:350; IV Piggyback:500]  Out: 2130 [QMVHQ:4696; Emesis/NG output:30]    Physical Exam:  Constitutional: Alert, Oriented x 3, No acute distress, well nourished and well hydrated.   Pulm: Tracheostomy in place, clear to auscultation bilaterally  CV: Regular rate and rhythm, normal S1, S2. No murmurs.   Abdomen: soft, normal bowel sounds, mildly, non-tender. No organomegaly. PEG site, with no surrounding erythema, dressing clean dry and intact.  Skin: No rashes, jaundice or skin lesions noted  Neuro: No focal deficits.     Diagnostic Studies:   Labs:  Recent Labs      11/21/16   0943  11/21/16   1748  11/21/16   2201   WBC  4.2*  3.6*  4.1*   HGB  8.9*  8.9*  8.7*   HCT  26.0*  25.9*  26.0*   PLT  111*  99*  112*     Recent Labs      11/20/16   1856  11/21/16   0501  11/21/16   1316   NA  139  142  142   K  3.3*  3.8  4.6   CL  108*  107  105   BUN  23*  21  18   CREATININE  1.19  0.93  0.82   GLU  122*  143  155     Recent Labs      11/19/16   0507  11/20/16   0512  11/21/16   0501   PROT  4.9*  4.9*  4.5*   ALBUMIN  2.5*  2.5*  2.6*   AST  42  32  18*   ALT  65  59  44   ALKPHOS  271*  295*  166*   BILITOT  0.9  0.9  1.3*     Recent Labs      11/19/16  0507  11/19/16   0845  11/19/16   1700  11/20/16   0512  11/21/16   0501   INR  1.82   --    --   1.96  2.76   APTT  189.7*  186.2*  145.0*  136.7*   --        Imaging:  Reviewed.    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 Getting ready for rehab and still can't swallow. Tolerating Cor-pak feeds. Request for G-tube. Has small window w/ surgical incision and some gas-filled bowel nearby, but will attempt. He was on coumadin for afib. S/p PEG on 9/6 with oozing at PEG site, sutured, with no further oozing.   Team called early on AM of 9/9 - patient with bright red blood from PEG tube, EGD revealed healthy PEG site, no ulceration at the bumper, and GE function ulcer, which was injected with epinephrine and clipped He continues to be hemodynamically stable no further bleeding, tube feeds restarted.    RECOMMENDATIONS:  - recommend PPI BID given erosive esophagitis and ulcer at GE junction  - PEG site with no induration, erythema, no further oozing following sutures placed at incision site yesterday afternoon  - Continue tube feeds, per nutrition consult  - Hepatology will sign off at this time.    Please page [phone] for further questions/concerns.  This patient was seen and examined, along with Attending, DR. Piedad Climes.    Karma Ganja, MD  Gastroenterology Fellow  [facility]

## 2016-11-22 NOTE — Unmapped (Signed)
SRF TEACHING ROUNDS    An interdisciplinary care conference was held today and included the following team members: Heidi Dach, MD Transplant Surgeon, SRF Surgery Resident , Belva Chimes, PA Transplant Physician Assistant, Toy Care, PharmD Transplant Pharmacist, Transplant Pharmacy Student, Justice Britain, LCSWA Transplant Social Worker, Thomasene Mohair, LCSWA Transplant Social Worker and Kandis Ban, RD Transplant Dietician.     Per team, Patient is now on floor status. Patient stable and doing well after recovering from GI bleed.     Discharge Plan: No tentative discharge plans at this time.       Robina Ade  Transplant Social Worker/Case Manager  [facility]

## 2016-11-23 LAB — BILIRUBIN TOTAL: Bilirubin:MCnc:Pt:Ser/Plas:Qn:: 1

## 2016-11-23 LAB — GAMMA GLUTAMYL TRANSFERASE: Gamma glutamyl transferase:CCnc:Pt:Ser/Plas:Qn:: 114 — ABNORMAL HIGH

## 2016-11-23 LAB — CBC
HEMATOCRIT: 26.5 % — ABNORMAL LOW (ref 41.0–53.0)
HEMOGLOBIN: 8.5 g/dL — ABNORMAL LOW (ref 13.5–17.5)
MEAN CORPUSCULAR HEMOGLOBIN CONC: 32.2 g/dL (ref 31.0–37.0)
MEAN CORPUSCULAR HEMOGLOBIN: 30.1 pg (ref 26.0–34.0)
MEAN CORPUSCULAR VOLUME: 93.5 fL (ref 80.0–100.0)
MEAN PLATELET VOLUME: 8.1 fL (ref 7.0–10.0)
PLATELET COUNT: 118 10*9/L — ABNORMAL LOW (ref 150–440)
RED BLOOD CELL COUNT: 2.84 10*12/L — ABNORMAL LOW (ref 4.50–5.90)
WBC ADJUSTED: 2.5 10*9/L — ABNORMAL LOW (ref 4.5–11.0)

## 2016-11-23 LAB — BASIC METABOLIC PANEL
ANION GAP: 7 mmol/L — ABNORMAL LOW (ref 9–15)
BUN / CREAT RATIO: 25
CHLORIDE: 102 mmol/L (ref 98–107)
CO2: 32 mmol/L — ABNORMAL HIGH (ref 22.0–30.0)
CREATININE: 0.93 mg/dL (ref 0.70–1.30)
EGFR MDRD AF AMER: >=60 mL/min/{1.73_m2} (ref >=60)
GLUCOSE RANDOM: 128 mg/dL (ref 65–179)
POTASSIUM: 3.7 mmol/L (ref 3.5–5.0)
SODIUM: 141 mmol/L (ref 135–145)

## 2016-11-23 LAB — HEPATIC FUNCTION PANEL
ALBUMIN: 2.8 g/dL — ABNORMAL LOW (ref 3.5–5.0)
ALKALINE PHOSPHATASE: 160 U/L — ABNORMAL HIGH (ref 38–126)
ALT (SGPT): 37 U/L (ref 19–72)
AST (SGOT): 22 U/L (ref 19–55)
BILIRUBIN DIRECT: 0.4 mg/dL (ref 0.00–0.40)
BILIRUBIN TOTAL: 1 mg/dL (ref 0.0–1.2)

## 2016-11-23 LAB — CYCLOSPORINE (FPIA) BLOOD: Lab: 198

## 2016-11-23 LAB — POTASSIUM: Potassium:SCnc:Pt:Ser/Plas:Qn:: 3.7

## 2016-11-23 LAB — PROTIME: Lab: 19.8 — ABNORMAL HIGH

## 2016-11-23 LAB — PROTIME-INR: INR: 1.74

## 2016-11-23 LAB — MEAN PLATELET VOLUME: Lab: 8.1

## 2016-11-23 LAB — PHOSPHORUS: Phosphate:MCnc:Pt:Ser/Plas:Qn:: 3.4

## 2016-11-23 LAB — MAGNESIUM: Magnesium:MCnc:Pt:Ser/Plas:Qn:: 1.4 — ABNORMAL LOW

## 2016-11-23 NOTE — Unmapped (Signed)
SRF TEACHING ROUNDS    An interdisciplinary care conference was held today and included the following team members: Heidi Dach, MD Transplant Surgeon, SRF Surgery Resident , Belva Chimes, PA Transplant Physician Assistant, Toy Care, PharmD Transplant Pharmacist, Transplant Pharmacy Fellow, Thomasene Mohair, LCSWA Transplant Social Worker and Kandis Ban, RD Transplant Dietician.     Per team, C at baseline. EKG planned for 9/17, speech consulted, PT/OT has been ordered. Most likely will d/c to [facility].    Discharge Plan: TBD      Thomasene Mohair, MSW  Transplant Social Worker/Case Manager  [facility]

## 2016-11-23 NOTE — Unmapped (Signed)
PHYSICAL THERAPY  Evaluation (11/23/16 1015)     Patient Name:  Jeffrey Ward       Medical Record Number: [id number]   Date of Birth: 1950-05-03  Sex: Male            Treatment Diagnosis: s/p liver transplant with complications     ASSESSMENT    Delio Slates is a 66 y.o. 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 Post-op course complicated by decompensation thought to be secondary to acute right heart failure and AKI requiring CRRT (resolved). 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 Severe PAH diagnosed. Eventually had episodes of a. Fib/tachyarrythmia x3, converted with amiodarone, then with digoxin; Cardiology following closely. On 11/03/16 he had a significant spike in his LFTs suspicious for acute rejection; however liver biopsy from 11/04/16 did not show any evidence of rejection.  Pt most recently with rapid response on 9/9 bleeding from PEG site and hematesis, possible GI bleed, transfer to SICU and intubated. Pt now transferred back to floor and stable and with Hgb 8.5.  Pt has been requiring 2 LO2 but was able to complete PT evaluation and all mobility activities on RA with stable O2 sats.  Pt continues to require min A for bed mobility, transfers and ambulation and displays some impulsivity and mild decreased safety awareness requiring cuing with assistive device. Pt will continue to benefit from skilled acute PT to further progress his independence and safety with functional mobility.  At this time pt is most appropriate for post acute PT 5x/week. Based on the AM-PAC 6 item raw score of 16/24, the patient is considered to be 47.12% impaired with basic mobility.      Today's Interventions: Performed PT Evaluation and educated on role and POC, provided VCs as needed with gait to ensure safety and improve stability     Activity Tolerance: Patient tolerated treatment well    PLAN  Planned Frequency of Treatment:  1-2x per day for: 4-5x week      Planned Interventions: Balance activities;Diaphragmatic / Pursed-lip breathing;Education - Patient;Education - Family / caregiver;Endurance activities;Functional mobility;Home exercise program;Neuromuscular re-education;Postural re-education;Self-care / Home training;Therapeutic exercise;Therapeutic activity;Transfer training    Post-Discharge Physical Therapy Recommendations:  5x weekly;High intensity        PT DME Recommendations: Defer to post acute     Goals:                SHORT GOAL #1: pt will complete bed mobility with supervision               Time Frame : 2 weeks  SHORT GOAL #2: Pt will perform all transfers mod I with LRAD               Time Frame : 2 weeks  SHORT GOAL #3: Pt will ambulate 125 ft with mod I using LRAD               Time Frame : 2 weeks  SHORT GOAL #4: Pt will negotitate up/down 1 flight of stairs with supervision               Time Frame : 2 weeks                      Prognosis:  Good  Barriers to Discharge: Decreased safety awareness;Gait instability;Endurance deficits  Positive Indicators: age, PLOF, motivation  SUBJECTIVE  Patient reports: Pt agreeable to PT   Current Functional Status: Pt min A for bed mobility, transfers and ambulation with RW   Services patient receives: PT     Equipment available at home: None    Past Medical History:   Diagnosis Date   ??? Cancer (CMS-HCC)    ??? Cirrhosis (CMS-HCC)    ??? Diabetes (CMS-HCC)    ??? Hepatitis C 07/17/2012   ??? Liver disease    ??? Low back pain 07/17/2012   ??? Varices, esophageal (CMS-HCC)     Social History   Substance Use Topics   ??? Smoking status: Never Smoker   ??? Smokeless tobacco: Never Used      Comment: Smoked in high school for about 5 years.    ??? Alcohol use No      Past Surgical History:   Procedure Laterality Date   ??? ANKLE SURGERY     ??? BACK SURGERY     ??? BACK SURGERY     ??? CHG US GUIDE, TISSUE ABLATION N/A 01/22/2016    Procedure: ULTRASOUND GUIDANCE FOR, AND MONITORING OF, PARENCHYMAL TISSUE ABLATION;  Surgeon: Particia Nearing, MD;  Location: MAIN OR [facility];  Service: Transplant   ??? IR EMBOLIZATION ORGAN ISCHEMIA, TUMORS, INFAR  06/16/2016    IR EMBOLIZATION ORGAN ISCHEMIA, TUMORS, INFAR 06/16/2016 Ammie Dalton, MD IMG VIR H&V [facility]   ??? PR COLON CA SCRN NOT HI RSK IND N/A 02/27/2015    Procedure: COLOREC CNCR SCR;COLNSCPY NO;  Surgeon: Vonda Antigua, MD;  Location: GI PROCEDURES MEMORIAL [facility];  Service: Gastroenterology   ??? PR ENDOSCOPIC ULTRASOUND EXAM N/A 02/27/2015    Procedure: UGI ENDO; W/ENDO ULTRASOUND EXAM INCLUDES ESOPHAGUS, STOMACH, &/OR DUODENUM/JEJUNUM;  Surgeon: Vonda Antigua, MD;  Location: GI PROCEDURES MEMORIAL [facility];  Service: Gastroenterology   ??? PR INSER HEART TEMP PACER ONE CHMBR N/A 10/02/2016    Procedure: Tempoarary Pacemaker Insertion;  Surgeon: Meredith Leeds, MD;  Location: [facility] EP;  Service: Cardiology   ??? PR LAP,DIAGNOSTIC ABDOMEN N/A 01/22/2016    Procedure: Laparoscopy, Abdomen, Peritoneum, & Omentum, Diagnostic, W/Wo Collection Specimen(S) By Brushing Or Washing;  Surgeon: Particia Nearing, MD;  Location: MAIN OR [facility];  Service: Transplant   ??? PR PLACE PERCUT GASTROSTOMY TUBE N/A 11/17/2016    Procedure: UGI ENDO; W/DIRECTED PLCMT PERQ GASTROSTOMY TUBE;  Surgeon: Cletis Athens, MD;  Location: GI PROCEDURES [facility];  Service: Gastroenterology   ??? PR TRACHEOSTOMY, PLANNED N/A 09/29/2016    Procedure: TRACHEOSTOMY PLANNED (SEPART PROC);  Surgeon: Katherina Mires, MD;  Location: MAIN OR [facility];  Service: Trauma   ??? PR TRANSCATH INSERT OR REPLACE LEADLESS PM VENTR N/A 10/11/2016    Procedure: Pacemaker Implant/Replace Leadless;  Surgeon: Meredith Leeds, MD;  Location: [facility] EP;  Service: Cardiology   ??? PR TRANSPLANT LIVER,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    Procedure: Liver Allotransplantation; Orthotopic, Partial Or Whole, From Cadaver Or Living Donor, Any Age;  Surgeon: Doyce Loose, MD; Location: MAIN OR [facility];  Service: Transplant   ??? PR TRANSPLANT,PREP DONOR LIVER, WHOLE N/A 09/15/2016    Procedure: Rogelia Boga Std Prep Cad Donor Whole Liver Gft Prior Tnsplnt,Inc Chole,Diss/Rem Surr Tissu Wo Triseg/Lobe Splt;  Surgeon: Doyce Loose, MD;  Location: MAIN OR [facility];  Service: Transplant   ??? PR UPPER GI ENDOSCOPY,BIOPSY N/A 07/17/2012    Procedure: UGI ENDOSCOPY; WITH BIOPSY, SINGLE OR MULTIPLE;  Surgeon: Alba Destine, MD;  Location: GI PROCEDURES [facility];  Service: Gastroenterology   ???  PR UPPER GI ENDOSCOPY,DIAGNOSIS N/A 02/04/2014    Procedure: UGI ENDO, INCLUDE ESOPHAGUS, STOMACH, & DUODENUM &/OR JEJUNUM; DX W/WO COLLECTION SPECIMN, BY BRUSH OR WASH;  Surgeon: Wilburt Finlay, MD;  Location: GI PROCEDURES MEMORIAL [facility];  Service: Gastroenterology   ??? PR UPPER GI ENDOSCOPY,LIGAT VARIX N/A 11/05/2013    Procedure: UGI ENDO; W/BAND LIG ESOPH &/OR GASTRIC VARICES;  Surgeon: Wilburt Finlay, MD;  Location: GI PROCEDURES MEMORIAL [facility];  Service: Gastroenterology    Family History   Problem Relation Age of Onset   ??? Hypertension Mother    ??? Cirrhosis Neg Hx    ??? Liver cancer Neg Hx         Allergies: Patient has no known allergies.                Objective Findings              Precautions:  (protective precautions )              Weight Bearing Status: Non- applicable              Required Braces or Orthoses: Non- applicable       Pain Comments: reports back pain from lying in bed, did not quantify   Medical Tests / Procedures: RR 9/9 bleeding from PEG site and hematesis, possible GI bleed, transfer to SICU and intubated   Equipment / Environment: Supplemental oxygen;Vascular access (PIV, TLC, Port-a-cath, PICC);Telemetry    At Rest: HR 82   With Activity: SpO2 100% on RA sitting EOB, 95% on RA after ambulation      Airway Clearance: mobility     Living environment: House  Lives With: Spouse  Home Living: One level home;Grab bars around toilet;Grab bars in shower;Walk-in shower;Garden tub;Built-in shower seat;Standard height toilet;Stairs to enter with rails  Rail placement (outside): Bilateral rails     Number of Stairs: 7    Cognition: WFL, following all commands, alert   Visual / Perception Status: normal   Skin Inspection: all visible skin intact     UE ROM: B UE WFL   UE Strength: B UE WFL   LE ROM: B LE WFL except R ankle with previous fusion so limited ROM   LE Strength: B UE WFL                 Coordination: normal    Proprioception: reports numbness in L) great toe and 2nd toe on plantar and dorsal surface   Sensation: reports numbness in L) great toe and 2nd toe on plantar and dorsal surface             Bed Mobility: Supine to sit with min A for log rolling   Transfers: Sit<>stand with CGA using RW    Gait: Ambulated x 50 ft with min A for steadying and min A for control of walker along with min VCs due to decreased safety awareness and fast pace           Endurance: Pt able to complete all activities on RA with stable O2 sats, 95% on RA     Eval Duration(PT): 25 Min.       PT G-Codes  Functional Assessment Tool Used: AMPAC   Score: 16/24   Functional Limitation: Mobility: Walking and moving around  Mobility: Walking and Moving Around Current Status [phone]): At least 40 percent but less than 60 percent impaired, limited or restricted  Mobility: Walking and Moving Around Goal Status [phone]): At least 1  percent but less than 20 percent impaired, limited or restricted  Rationale: AMPAC 16/24 47.12% impaired   I attest that I have reviewed the above information.  Signed: Gloris Ham, PT  Filed 11/23/2016

## 2016-11-23 NOTE — Unmapped (Signed)
OCCUPATIONAL THERAPY  Evaluation (11/23/16 1126)    Patient Name:  Jeffrey Ward       Medical Record Number: [id number]   Date of Birth: 09/05/1950  Sex: Male          OT Treatment Diagnosis:  difficulty with ADLs & functional mobility/transfers s/p OLT complications    Assessment  Jaquin Makarewicz is a 66 y.o. 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 Post-op course complicated by decompensation thought to be secondary to acute right heart failure and AKI requiring CRRT (resolved). 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 Severe PAH diagnosed. Eventually had episodes of a. Fib/tachyarrythmia x3, converted with amiodarone, then with digoxin; Cardiology following closely. On 11/03/16 he had a significant spike in his LFTs suspicious for acute rejection; however liver biopsy from 11/04/16 did not show any evidence of rejection.  Pt most recently with rapid response on 9/9 bleeding from PEG site and hematesis, possible GI bleed, transfer to SICU and intubated.  Pt now transferred back to floor and stable and with Hgb 8.5.      Pt presents with complicated hospital course with multiple complications, abdominal & protective precautions, L great & 2nd toe numbness & impaired proprioception, impaired balance and decreased impacting his independence & safety completing ADLs & functional mobility/transfers.  Pt will benefit from skilled OT to maximize his functional indepenence, safety and decrease his burden of care.  Based on the daily activity AM-PAC raw score of 16/24, the pt is considered to be 53.32% impaired with self care. This indicates pt is appropriate for 5x weekly at d/c.  Review of pt's occupational profile, client history, assessment of occupational performance, clinical decision making and development of POC required moderate complexity OT evaluation. Activity Tolerance During Today's Session  Patient tolerated treatment well    Plan  Planned Frequency of Treatment:  1x per day for: 3-4x week    Planned Interventions:  Adaptive equipment;ADL retraining;Balance activities;Bed mobility;Compensatory tech. training;Conservation;Education - Patient;Education - Family / caregiver;Endurance activities;Functional cognition;Functional mobility;Home exercise program;Modalities;Safety education;Range of motion;Positioning;Postular / Proximal stability;Therapeutic exercise;UE Strength / coordination exercise;Transfer training    Post-Discharge Occupational Therapy Recommendations:  OT Post Acute Discharge Recommendations: High intensity;5x weekly   OT DME Recommendations: Defer to post acute    GOALS:   Patient and Family Goals: to return to PLOF    Long Term Goal #1: Pt will maximize independence with self care and functional activities in 12 weeks.     Short Term:  Pt will complete toilet transfer mod I using LRAD.   Time Frame : 2 weeks  Pt will complete toileting mod I using LRAD.   Time Frame : 2 weeks  Pt will complete LB dressing mod I using LRAD.   Time Frame : 2 weeks  Pt will complete functional activity in standing x10 minutes mod I for balance using LRAD.   Time Frame : 2 weeks    Prognosis:  Good  Positive Indicators:  PLOF, supportive wife  Barriers to Discharge: Endurance deficits;Functional strength deficits;Inability to safely perform ADLS;Impaired Balance    Subjective  Current Status Pt received and left sitting up in recliner, all needs within reach, wife in room, RN Alinda Money notified  Prior Functional Status independent with ADLs and functional mobility/transfers    Medical Tests / Procedures: OLT 09/15/16, g-tube placement 9/6  Patient / Caregiver reports: Wife stated He's determined.  He won't give up.    Past Medical History:   Diagnosis Date   ??? Cancer (CMS-HCC)    ??? Cirrhosis (CMS-HCC)    ??? Diabetes (CMS-HCC)    ??? Hepatitis C 07/17/2012   ??? Liver disease ??? Low back pain 07/17/2012   ??? Varices, esophageal (CMS-HCC)     Social History   Substance Use Topics   ??? Smoking status: Never Smoker   ??? Smokeless tobacco: Never Used      Comment: Smoked in high school for about 5 years.    ??? Alcohol use No      Past Surgical History:   Procedure Laterality Date   ??? ANKLE SURGERY     ??? BACK SURGERY     ??? BACK SURGERY     ??? CHG US GUIDE, TISSUE ABLATION N/A 01/22/2016    Procedure: ULTRASOUND GUIDANCE FOR, AND MONITORING OF, PARENCHYMAL TISSUE ABLATION;  Surgeon: Particia Nearing, MD;  Location: MAIN OR [facility];  Service: Transplant   ??? IR EMBOLIZATION ORGAN ISCHEMIA, TUMORS, INFAR  06/16/2016    IR EMBOLIZATION ORGAN ISCHEMIA, TUMORS, INFAR 06/16/2016 Ammie Dalton, MD IMG VIR H&V [facility]   ??? PR COLON CA SCRN NOT HI RSK IND N/A 02/27/2015    Procedure: COLOREC CNCR SCR;COLNSCPY NO;  Surgeon: Vonda Antigua, MD;  Location: GI PROCEDURES [facility]ORIAL [facility];  Service: Gastroenterology   ??? PR ENDOSCOPIC ULTRASOUND EXAM N/A 02/27/2015    Procedure: UGI ENDO; W/ENDO ULTRASOUND EXAM INCLUDES ESOPHAGUS, STOMACH, &/OR DUODENUM/JEJUNUM;  Surgeon: Vonda Antigua, MD;  Location: GI PROCEDURES MEMORIAL [facility];  Service: Gastroenterology   ??? PR INSER HEART TEMP PACER ONE CHMBR N/A 10/02/2016    Procedure: Tempoarary Pacemaker Insertion;  Surgeon: Meredith Leeds, MD;  Location: [facility] EP;  Service: Cardiology   ??? PR LAP,DIAGNOSTIC ABDOMEN N/A 01/22/2016    Procedure: Laparoscopy, Abdomen, Peritoneum, & Omentum, Diagnostic, W/Wo Collection Specimen(S) By Brushing Or Washing;  Surgeon: Particia Nearing, MD;  Location: MAIN OR [facility];  Service: Transplant   ??? PR PLACE PERCUT GASTROSTOMY TUBE N/A 11/17/2016    Procedure: UGI ENDO; W/DIRECTED PLCMT PERQ GASTROSTOMY TUBE;  Surgeon: Cletis Athens, MD;  Location: GI PROCEDURES MEMORIAL [facility];  Service: Gastroenterology   ??? PR TRACHEOSTOMY, PLANNED N/A 09/29/2016    Procedure: TRACHEOSTOMY PLANNED (SEPART PROC);  Surgeon: Katherina Mires, MD; Location: MAIN OR [facility];  Service: Trauma   ??? PR TRANSCATH INSERT OR REPLACE LEADLESS PM VENTR N/A 10/11/2016    Procedure: Pacemaker Implant/Replace Leadless;  Surgeon: Meredith Leeds, MD;  Location: [facility] EP;  Service: Cardiology   ??? PR TRANSPLANT LIVER,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    Procedure: Liver Allotransplantation; Orthotopic, Partial Or Whole, From Cadaver Or Living Donor, Any Age;  Surgeon: Doyce Loose, MD;  Location: MAIN OR [facility];  Service: Transplant   ??? PR TRANSPLANT,PREP DONOR LIVER, WHOLE N/A 09/15/2016    Procedure: Rogelia Boga Std Prep Cad Donor Whole Liver Gft Prior Tnsplnt,Inc Chole,Diss/Rem Surr Tissu Wo Triseg/Lobe Splt;  Surgeon: Doyce Loose, MD;  Location: MAIN OR [facility];  Service: Transplant   ??? PR UPPER GI ENDOSCOPY,BIOPSY N/A 07/17/2012    Procedure: UGI ENDOSCOPY; WITH BIOPSY, SINGLE OR MULTIPLE;  Surgeon: Alba Destine, MD;  Location: GI PROCEDURES MEMORIAL [facility];  Service: Gastroenterology   ??? PR UPPER GI ENDOSCOPY,DIAGNOSIS N/A 02/04/2014    Procedure: UGI ENDO, INCLUDE ESOPHAGUS, STOMACH, & DUODENUM &/OR JEJUNUM; DX W/WO COLLECTION SPECIMN, BY BRUSH OR WASH;  Surgeon: Wilburt Finlay, MD;  Location: GI PROCEDURES  MEMORIAL [facility];  Service: Gastroenterology   ??? PR UPPER GI ENDOSCOPY,LIGAT VARIX N/A 11/05/2013    Procedure: UGI ENDO; W/BAND LIG ESOPH &/OR GASTRIC VARICES;  Surgeon: Wilburt Finlay, MD;  Location: GI PROCEDURES MEMORIAL [facility];  Service: Gastroenterology    Family History   Problem Relation Age of Onset   ??? Hypertension Mother    ??? Cirrhosis Neg Hx    ??? Liver cancer Neg Hx         Patient has no known allergies.     Objective Findings  Precautions / Restrictions  Falls precautions;Other precautions;Neutropenic precautions (abdominal)    Weight Bearing  Non-applicable    Required Braces or Orthoses  Non-applicable    Communication Preference  Verbal    Pain  c/o pre-existing back pain during LB dressing however did not quantify    Equipment / Environment Gastric tube;Vascular access (PIV, TLC, Port-a-cath, PICC);Supplemental oxygen    Living Situation   Living environment: House   Lives With: Spouse   Home Living: One level home;Grab bars around toilet;Grab bars in shower;Walk-in shower;Garden tub;Built-in shower seat;Standard height toilet;Stairs to enter with rails   Equipment available at home: None   Rail placement (outside): Bilateral rails    Cognition   Orientation Level:  Oriented x 4   Arousal/Alertness:  Appropriate responses to stimuli   Attention Span:  Appears intact   Memory:  Appears intact   Following Commands:  Follows all commands and directions without difficulty   Safety Judgment:  Good awareness of safety precautions   Awareness of Errors:  Good awareness of safety precautions   Problem Solving:  Able to problem solve independently    Vision / Perception  Vision: No visual deficits  Perception: WFL    Hand Function  Hand Dominance: right  B grasp/release WFL    Skin Inspection  visible skin c/d/i    ROM / Strength/Coordination  UE ROM/ Strength/ Coordination: AROM WFL; MMT grossly 4/5; decreased muscular endurance for sustained activity  LE ROM/ Strength/ Coordination: Pt able to complete figure four position w/ RLE, unable with LLE 2/2 c/o increased back pain; demonstrated sufficient strength during functional mobility; see PT note for formal assessment.    Sensation:  Pt reported L great & 2nd toe numbness x2 weeks impacting his proprioception & balance.  RN Alinda Money (in person) and MD Wallace Cullens (via text paged) notified    Balance:  static/dynamic sitting=supervision; static standing=CGA; dynamic standing=min assist    Mobility/Gait/Transfers: sit<>stand=CGA, min verbal cues to push up from chair vs pulling on RW; functional mobility from recliner<>bathroom using RW=min assist, posterior LOB noted when turning to align to sit on toilet; toilet transfer=min assist, min verbal cues to use grab bar & window ledge vs keeping B hands on RW during sit<>stand; standing tolerance=~3 minutes w/ c/o fatigue after functional tasks    ADL:  Bathing: set up & supervision in sitting; min assist if standing to wash perineal/buttocks-simulated task  Grooming: set up in sitting  Dressing: UB=set up; LB=min assist.  Pt able to complete figure four position w/ RLE to doff/donn sock, unable with LLE 2/2 c/o increased back pain therefore completed via forward flexion, no c/o abdominal pain.  Pt required min assist for dynamic standing balance when simulating pulling clothing up/down.  Eating: total assist-tube feeds  Toileting: min assist for dynamic standing balance during clothing management  IADLs: NT    Vitals/ Orthostatics:  At Rest: NAD  With Activity: NAD    Interventions Performed During Today's  Session: role of OT, POC, abdominal & protective precautions, figure four position for LB ADLS, introduced LH AE for LB ADLS however pt declined practice, hand placement during sit<>stand, balance & attention, using vision to compensate for L great & 2nd toe numbness, energy conservation strategies including rest breaks, pacing, seated ADLs, etc, safety, dispo recs    OT G-codes  Functional Assessment Tool Used: AMPAC  Score: 16  Functional Limitation: Self care  Self Care Current Status (Z6109): At least 40 percent but less than 60 percent impaired, limited or restricted  Self Care Goal Status (U0454): At least 20 percent but less than 40 percent impaired, limited or restricted  AMPAC score indicates pt has a 53.32% self care deficit.    Eval Duration (OT): 40 Min.    Medical Staff Made Aware: RN Alinda Money, MD Wallace Cullens via text page    I attest that I have reviewed the above information.  Signed: Virgina Evener, OT  Filed 11/23/2016

## 2016-11-23 NOTE — Unmapped (Signed)
Problem: Patient Care Overview  Goal: Plan of Care Review  Outcome: Not Progressing  ?? Vital signs stable, remains afebrile. Remains A&O x 4.   ?? Requested SCD sleeve removal r/t being hot , temperature adjusted, risks explained. Appeared diaphoretic intermittently this shift, remains afebrile.   ?? Remains strict NPO. Does report severe dryness, would like another swallow study I told him to discus with MD's on rounds and I would pass it on to the day shift nurse   ?? Tolerating tube feedings at 60 ml a hour . Residuals remain below 100 ml. Required fluid thinning off residuals because the consistency is so thick and clumpy, it would not go down the tube without thinning.  ?? Blood glucose monitoring maintained ACHS. Insulin administered per orders,   ?? Oxygen saturations WNL on 1 liter of oxygen via nasal canula. Occlusive dx maintained on trach removal site.   ?? Administered Tramadol 2x this shift for reports of back pain.   ?? Telemetry monitor maintained, remains in NSR per telemetry tech Brock at 0434 am 9/12   ?? Will continue to monitor  ??   ??

## 2016-11-23 NOTE — Unmapped (Signed)
Transplant Surgery Progress Note    Hospital Day: 32    Assessment:     Jeffrey Ward??is a 66 y.o.??male??with a history of IDDM, GERD, prior perioperative a-fib with RVR and 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 Post-op course complicated by decompensation thought to be secondary to acute right heart failure and AKI requiring CRRT (resolved). 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 Severe PAH diagnosed. Eventually had episodes of a. Fib/tachyarrythmia x3, converted with amiodarone, then with digoxin; Cardiology following closely. On 11/03/16 he had a significant spike in his LFTs suspicious for acute rejection; however liver biopsy from 11/04/16 did not show any evidence of rejection.     Underwent G tube on 11/17/2016.  Hep gtt restarted for bridging to coumadin. Developed GIB on 9/9 and underwent intubation for EGD. Small ulcer at GEJ was injected with epinephrine and clipped.     Subjective/Interval Events:   NAEO. No bowel movement yesterday. Tolerating tube feeds well with no evidence of ongoing bleeding.     Plan:     Neuro: Cognition/agitation continuing to improve  -Pain:  ???? -tylenol and tramadol  Psych:   ???? - thiamine, melatonin  ??  CV: Currently NSR   Cards following  - permanent pace maker placed 8/31  - Prev R heart failure, a fib/tachyarrythmia  - amiodarone and digoxin for afib with RVR  - Asystolic pauses  - EKG M/Th, last QTc 9/8 was 547 ms  ??  Pulm: Extubated 9/9, stable on 2L [place]  - de-cannulated 9/7  - Atrovent nebs q6 PRN  - Pulmonary artery hypertension - on sildenafil, off epoprostenol    FEN/GI:   - Liver US: intrahepatic ductal dilation   - Liver biopsy 8/24- no acute rejection  - Failed MBSS s/p PEG 9/6  - PEG Feeds - trickle tube feeds at 20 cc/hr, advance q4h to goal   - medlock  - EGD 9/9 (report not yet available)- clotted blood in stomach, no significant blood from around the G tube site, small GEJ ulcer injected with epinephrine and clipped  - PO PPI BID per GI recs  - Per SLP, ok for ice chips for comfort after oral care  - Resume bowel regimen    Renal: AKI resolving, likely pre-renal vs. obstructive vs. combination  -??renally dose meds   - foley dc'd 9/11, voiding spontaneously  ??  Endo:   -Endocrine following  -Current recs: decrease NPH 6 U BID (do not hold if NPO), continue scheduled 4U q6h regular insulin (hold if NPO), continue regular SSI  ??  Heme/ID:  - Warfarin 3mg  restarted 9/11. INR 1.74 this morning. Continue same dose  - 2 u pRBC transfused 9/9  ??  Immunosuppresion  - 3/3 dose of 500 mg methylprednisone 8/25 and taper. ??  - Tac -> cyclosporine - tremors improved   - continue MMF  ??  PPx:   Bactrim  Valcyte  ??  Dispo: Floor  - PT/OT reconsulted. Will plan for AIR evaluation this week      Objective:        Vital Signs:  BP 133/61  - Pulse 73  - Temp 35.9 ??C (Oral)  - Resp 17  - Ht 157 cm (5' 1.81)  - Wt 67.8 kg (149 lb 7.6 oz)  - SpO2 99%  - BMI 27.51 kg/m??     Input/Output:  I/O last 3 completed shifts:  In: 1910 [NG/GT:1910]  Out: 1650 [Urine:1650]  Physical Exam:    General: No acute distress  Neuro: Sleeping but awakens, interactive.   Lungs: soft voice, 2L [place], decannulated- site healing well  Abdomen: Incision c/d/i. Soft, non-tender. PEG in place, no oozing around G tube site. Tube feeds infusing  Ext: [facility]      Lab Results   Component Value Date    WBC 2.5 (L) 11/23/2016    HGB 8.5 (L) 11/23/2016    HCT 26.5 (L) 11/23/2016    PLT 118 (L) 11/23/2016       Lab Results   Component Value Date    NA 141 11/23/2016    K 3.7 11/23/2016    CL 102 11/23/2016    CO2 32.0 (H) 11/23/2016    BUN 23 (H) 11/23/2016    CREATININE 0.93 11/23/2016    CALCIUM 9.0 11/23/2016    MG 1.4 (L) 11/23/2016    PHOS 3.4 11/23/2016     Imaging:  No new imaging

## 2016-11-23 NOTE — Unmapped (Signed)
Problem: Patient Care Overview  Goal: Plan of Care Review  Outcome: Progressing   11/22/16 1853   OTHER   Plan of Care Reviewed With patient   Plan of Care Review   Progress no change     Plan of care reviewed w/ pt this AM. VSS. Afebrile. Stable on 2L. Attempt to wean to 1L. Decannulated on 9/9. Old trach site. PEG tube. Cont tube feeds at goal rate of 60 mL/hr. ACHS. Insulin coverage given as ordered. Strict NPO d/t aspiration risk. PO 3mg  warfarin restarted this PM. Will monitor for signs of bleeding. Weight shift assistance offered but pt declined repositioning. Foley removed this AM. Pt voided and 200 mL w/ bladder scan of 0 mL. PRN tramadol given. Up in chair this afternoon. Ambulated in room w/ walker and standby assist.

## 2016-11-24 ENCOUNTER — Inpatient Hospital Stay
Admission: EM | Admit: 2016-11-24 | Discharge: 2016-12-10 | Disposition: A | Discharge status: Home Health | Payer: MEDICARE | Source: Assisted Living Facility | Attending: Physical Medicine & Rehabilitation | Admitting: Physical Medicine & Rehabilitation

## 2016-11-24 ENCOUNTER — Inpatient Hospital Stay
Admission: EM | Admit: 2016-11-24 | Discharge: 2016-12-10 | Disposition: A | Discharge status: Home Health | Source: Assisted Living Facility

## 2016-11-24 ENCOUNTER — Inpatient Hospital Stay
Admission: EM | Admit: 2016-11-24 | Discharge: 2016-12-10 | Disposition: A | Discharge status: Home Health | Payer: MEDICARE | Source: Assisted Living Facility

## 2016-11-24 DIAGNOSIS — K7469 Other cirrhosis of liver: Principal | ICD-10-CM

## 2016-11-24 LAB — CBC W/ AUTO DIFF
EOSINOPHILS ABSOLUTE COUNT: 0.1 10*9/L (ref 0.0–0.4)
HEMATOCRIT: 25.4 % — ABNORMAL LOW (ref 41.0–53.0)
LARGE UNSTAINED CELLS: 1 % (ref 0–4)
LYMPHOCYTES ABSOLUTE COUNT: 0.6 10*9/L — ABNORMAL LOW (ref 1.5–5.0)
MEAN CORPUSCULAR HEMOGLOBIN CONC: 34.3 g/dL (ref 31.0–37.0)
MEAN CORPUSCULAR HEMOGLOBIN: 31.8 pg (ref 26.0–34.0)
MEAN CORPUSCULAR VOLUME: 92.6 fL (ref 80.0–100.0)
MEAN PLATELET VOLUME: 8.5 fL (ref 7.0–10.0)
MONOCYTES ABSOLUTE COUNT: 0.2 10*9/L (ref 0.2–0.8)
NEUTROPHILS ABSOLUTE COUNT: 2.1 10*9/L (ref 2.0–7.5)
PLATELET COUNT: 95 10*9/L — ABNORMAL LOW (ref 150–440)
RED BLOOD CELL COUNT: 2.74 10*12/L — ABNORMAL LOW (ref 4.50–5.90)
RED CELL DISTRIBUTION WIDTH: 22.5 % — ABNORMAL HIGH (ref 12.0–15.0)
WBC ADJUSTED: 3 10*9/L — ABNORMAL LOW (ref 4.5–11.0)

## 2016-11-24 LAB — PHOSPHORUS: Phosphate:MCnc:Pt:Ser/Plas:Qn:: 3.2

## 2016-11-24 LAB — HEPATIC FUNCTION PANEL
ALKALINE PHOSPHATASE: 295 U/L — ABNORMAL HIGH (ref 38–126)
AST (SGOT): 104 U/L — ABNORMAL HIGH (ref 19–55)
BILIRUBIN TOTAL: 2 mg/dL — ABNORMAL HIGH (ref 0.0–1.2)
PROTEIN TOTAL: 4.8 g/dL — ABNORMAL LOW (ref 6.5–8.3)

## 2016-11-24 LAB — BASIC METABOLIC PANEL
ANION GAP: 7 mmol/L — ABNORMAL LOW (ref 9–15)
BLOOD UREA NITROGEN: 24 mg/dL — ABNORMAL HIGH (ref 7–21)
BUN / CREAT RATIO: 27
CALCIUM: 8.9 mg/dL (ref 8.5–10.2)
CHLORIDE: 100 mmol/L (ref 98–107)
CO2: 34 mmol/L — ABNORMAL HIGH (ref 22.0–30.0)
CREATININE: 0.9 mg/dL (ref 0.70–1.30)
EGFR MDRD AF AMER: >=60 mL/min/{1.73_m2} (ref >=60)
EGFR MDRD NON AF AMER: >=60 mL/min/{1.73_m2} (ref >=60)
GLUCOSE RANDOM: 170 mg/dL (ref 65–179)
SODIUM: 141 mmol/L (ref 135–145)

## 2016-11-24 LAB — CYCLOSPORINE (FPIA) BLOOD: Lab: 165

## 2016-11-24 LAB — SMEAR REVIEW

## 2016-11-24 LAB — PROTIME-INR: PROTIME: 19.3 s — ABNORMAL HIGH (ref 10.2–12.8)

## 2016-11-24 LAB — MEAN CORPUSCULAR VOLUME: Lab: 92.6

## 2016-11-24 LAB — GAMMA GLUTAMYL TRANSFERASE: Gamma glutamyl transferase:CCnc:Pt:Ser/Plas:Qn:: 261 — ABNORMAL HIGH

## 2016-11-24 LAB — CYCLOSPORINE LEVEL: CYCLOSPORINE (FPIA) BLOOD: 165 ng/mL

## 2016-11-24 LAB — INR: Lab: 1.69

## 2016-11-24 LAB — ALT (SGPT): Alanine aminotransferase:CCnc:Pt:Ser/Plas:Qn:: 64

## 2016-11-24 LAB — MAGNESIUM: Magnesium:MCnc:Pt:Ser/Plas:Qn:: 2

## 2016-11-24 LAB — CALCIUM: Calcium:MCnc:Pt:Ser/Plas:Qn:: 8.9

## 2016-11-24 MED ORDER — MELATONIN 3 MG TABLET
Freq: Every evening | GASTROSTOMY | 0 refills | 0 days | Status: SS
Start: 2016-11-24 — End: 2016-12-08

## 2016-11-24 MED ORDER — ACETAMINOPHEN 650 MG/20.3 ML ORAL SOLUTION
GASTROSTOMY | 0 refills | 0 days | PRN
Start: 2016-11-24 — End: 2016-12-10

## 2016-11-24 MED ORDER — TRAMADOL 50 MG TABLET
Freq: Four times a day (QID) | GASTROSTOMY | 0 refills | 0.00000 days | Status: SS | PRN
Start: 2016-11-24 — End: 2016-12-08

## 2016-11-24 MED ORDER — DOCUSATE SODIUM 50 MG/5 ML ORAL LIQUID
Freq: Two times a day (BID) | GASTROSTOMY | 0 refills | 0.00000 days | Status: SS
Start: 2016-11-24 — End: 2016-12-08

## 2016-11-24 MED ORDER — CYCLOSPORINE MODIFIED 100 MG/ML ORAL SOLUTION
Freq: Two times a day (BID) | GASTROSTOMY | 12 refills | 0 days | Status: SS
Start: 2016-11-24 — End: 2016-12-08

## 2016-11-24 MED ORDER — SILDENAFIL (PULMONARY HYPERTENSION) 10 MG/ML ORAL SUSPENSION
Freq: Three times a day (TID) | GASTROSTOMY | 0 refills | 0.00000 days | Status: SS
Start: 2016-11-24 — End: 2016-12-08

## 2016-11-24 MED ORDER — ALBUTEROL SULFATE CONCENTRATE 2.5 MG/0.5 ML SOLUTION FOR NEBULIZATION
Freq: Four times a day (QID) | RESPIRATORY_TRACT | 12 refills | 0 days | PRN
Start: 2016-11-24 — End: 2016-12-10

## 2016-11-24 MED ORDER — MYCOPHENOLATE MOFETIL 200 MG/ML ORAL SUSPENSION
Freq: Two times a day (BID) | GASTROSTOMY | 12 refills | 0.00000 days | Status: SS
Start: 2016-11-24 — End: 2016-12-08

## 2016-11-24 MED ORDER — POLYETHYLENE GLYCOL 3350 17 GRAM ORAL POWDER PACKET
PACK | Freq: Two times a day (BID) | GASTROSTOMY | 0 refills | 0 days | Status: SS | PRN
Start: 2016-11-24 — End: 2016-12-08

## 2016-11-24 MED ORDER — INSULIN U-100 REGULAR HUMAN 100 UNIT/ML INJECTION SOLUTION: mL | Freq: Four times a day (QID) | 0 refills | 0 days | Status: SS

## 2016-11-24 MED ORDER — INSULIN U-100 REGULAR HUMAN 100 UNIT/ML INJECTION SOLUTION
Freq: Four times a day (QID) | SUBCUTANEOUS | 0 refills | 0.00000 days | Status: SS
Start: 2016-11-24 — End: 2016-12-08

## 2016-11-24 MED ORDER — WARFARIN 3 MG TABLET
Freq: Every day | GASTROSTOMY | 0 refills | 0 days | Status: SS
Start: 2016-11-24 — End: 2016-12-08

## 2016-11-24 MED ORDER — INSULIN NPH ISOPHANE U-100 HUMAN 100 UNIT/ML SUBCUTANEOUS SUSPENSION
Freq: Two times a day (BID) | SUBCUTANEOUS | 0 refills | 0 days | Status: SS
Start: 2016-11-24 — End: 2016-12-08

## 2016-11-24 NOTE — Unmapped (Signed)
SRF CASE REVIEW    An interdisciplinary care conference was held today and included the following team members: Johna Sheriff, MD Transplant Surgeon, Heidi Dach, MD Transplant Surgeon, Aleen Campi, MD Transplant Surgeon, Everlene Balls Transplant Surgeon , SRF Surgery Resident , Belva Chimes, PA Transplant Physician Assistant, Nilda Riggs, NP  Transplant Clinic Nurse Practioner, Toy Care, PharmD Transplant Pharmacist, Justice Britain, LCSWA Transplant Social Worker, Thomasene Mohair, LCSWA Transplant Social Worker, Kandis Ban, RD Transplant Dietician and Medical Student.     Per team, patient recovered well from GI bleed. Patient vomited today but is feeling better. PT has recommended 5X high. Bili 1.2.This SW contacted AIR.     Discharge Plan: No tentative discharge plans at this time.       Transplant Social Worker/Case Manager  [facility] for Transplant Care

## 2016-11-24 NOTE — Unmapped (Signed)
Transplant Surgery Progress Note    Hospital Day: 72    Assessment:     Jeffrey Ward??is a 66 y.o.??male??with a history of IDDM, GERD, prior perioperative a-fib with RVR and 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 Post-op course complicated by decompensation thought to be secondary to acute right heart failure and AKI requiring CRRT (resolved). 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 Severe PAH diagnosed. Eventually had episodes of a. Fib/tachyarrythmia x3, converted with amiodarone, then with digoxin; Cardiology following closely. On 11/03/16 he had a significant spike in his LFTs suspicious for acute rejection; however liver biopsy from 11/04/16 did not show any evidence of rejection.     Underwent G tube on 11/17/2016.  Hep gtt restarted for bridging to coumadin. Developed GIB on 9/9 and underwent intubation for EGD. Small ulcer at GEJ was injected with epinephrine and clipped.     Subjective/Interval Events:   200cc of emesis this AM. Otherwise feeling well    Plan:     -- EKG QTc 9/13: 382  -- Increase miralax to BID  -- KUB shows nonobstructed bowel gas pattern  -- Will change PEG feeds   --- start with 120 mL, run over pump x 1 hr    --- increase bolus by 30 mL q4 hrs    --- final goal: 1 can (237 mL) 5x daily (6a, 10a, 2p, 6p, 10p)  -- Repleted Potassium  -- Will increase warfarin to 4mg  qd tomorrow 9/14  -- Accepted to AIR    Lysle Rubens MD  General Surgery, PGY1  Pager # [phone]      Objective:        Vital Signs:  BP 142/66  - Pulse 80  - Temp 36.5 ??C (Oral)  - Resp 18  - Ht 157 cm (5' 1.81)  - Wt 67.8 kg (149 lb 7.6 oz)  - SpO2 97%  - BMI 27.51 kg/m??     Input/Output:  I/O last 3 completed shifts:  In: 2600 [P.O.:60; NG/GT:2440; IV Piggyback:100]  Out: 2285 [Urine:2035; Emesis/NG output:250]    Physical Exam:    General: No acute distress  Neuro: Sleeping but awakens, interactive.   Lungs: soft voice, getting stronger every day, SORA, decannulated- site healing well  Abdomen: Incision c/d/i. Soft, non-tender. PEG in place, no oozing around G tube site. Tube feeds infusing  Ext: [facility]      Lab Results   Component Value Date    WBC 3.0 (L) 11/24/2016    HGB 8.7 (L) 11/24/2016    HCT 25.4 (L) 11/24/2016    PLT 95 (L) 11/24/2016       Lab Results   Component Value Date    NA 141 11/24/2016    K 3.4 (L) 11/24/2016    CL 100 11/24/2016    CO2 34.0 (H) 11/24/2016    BUN 24 (H) 11/24/2016    CREATININE 0.90 11/24/2016    CALCIUM 8.9 11/24/2016    MG 2.0 11/24/2016    PHOS 3.2 11/24/2016     Imaging:  No new imaging

## 2016-11-24 NOTE — Unmapped (Signed)
Problem: Patient Care Overview  Goal: Plan of Care Review  Outcome: Progressing  ?? Vital signs stable, remains afebrile. Remains A&O x 4.   ?? Requested SCD sleeve removal r/t being hot , temperature adjusted, risks explained. Appeared diaphoretic intermittently this shift, remains afebrile.    ?? Tolerating ice chips for comfort   ?? Tolerating tube feedings at 60 ml a hour . Residuals checked per policy.   ?? Blood glucose monitoring maintained ACHS. Insulin administered per orders,   ?? Oxygen saturations WNL on 1 liter of oxygen via nasal canula. Occlusive dx maintained on trach removal site.   ?? Administered Tramadol 2x this shift for reports of back pain.   ?? Telemetry monitor maintained, remains in NSR per telemetry tech Brock at 0434 am 9/12   ?? Will continue to monitor  ??   ??

## 2016-11-24 NOTE — Unmapped (Signed)
Adult Nutrition Transplant Discharge Note    Plan to discharge to AIR on 9/13. Transplant education not completed as pt is NPO w/ tube feeds to meet ~100% of needs. Pt to discharge to AIR on the following nutrition intervention: bolus feeds of Jevity 1.5 (recs in note filed at 1217).    Will continue to follow pt while in AIR.    Denton Lank November Sypher

## 2016-11-24 NOTE — Unmapped (Signed)
Endocrinology Consult Note    Requesting Attending Physician :  Doyce Loose,*  Service Requesting Consult : Surg Transplant [facility])  Primary Care Provider: [name], MD  Outpatient Endocrinologist: N/A    Assessment/Recommendations:    66 yo male with h/o DM2 who s/p liver transplant. Endo consulted for management of DM in the setting of recent initiation of prednisone and tacrolimus and now on 14h tube feeds.     1. Type 2 diabetes, uncontrolled: *No new recs. BG doing well. Please notify us if starting bolus feeds as well.   - continue  NPH 6u BID (do not hold if NPO). Previously hypoglycemic on 8 units when NPO.  - continue nutritional dose of regular insulin 4u Q6hrs   - Continue STANDARD regular insulin Q6hrs.        -Advise of any changes to steroid dose or to diet    2. Nutrition:  Variable,thus complicating overall glycemic control. Currently NPO,TF at goal rate rate of 60 mL/hr  --- at goal, order will provide 1260 mL, 2268 kcal, 102 g PRO, 202 g CHO, 919 mL free water w/ additional FWF per team  ??  Eventual bolus regimen per notes (will likely require additional insulin for this):  --- start with 120 mL, run over pump x 1 hr   --- increase bolus by 30 mL q4 hrs   --- final goal: 1 can (237 mL) 5x daily (6a, 10a, 2p, 6p, 10p)              At goal, order will provide 1185 mL, 2133 kcal, 96 g PRO, 190        g CHO,  865 mL free water      3. Potential for amio induced thyroid dysfunction  Amio started 8/14  -Baseline TSH checked: 4.0  -Check TFT q6 months      Thank you for the consult. Discussed with team. We will continue to follow patient as needed. Please call/page [phone] with questions or concerns.         History of Present Illness: :    Reason for Consult:   DM2, exacerbated by steroids and calcineurin inhibitor    Jeffrey Ward is a 66 y.o. male w/ h/o DM2, Afib, pulmonary HTN, HCV cirrhosis admitted for S/P liver transplant (CMS-HCC) on 7/5. I have been asked to evaluate Jeffrey Ward for DM management. Post-op course was complicated by volume overload thought to be due to CHF exacerbation, respiratory failure now trached and on vent, and AKI requiring CRRT. A1c was 8.0 prior to surgery. He is on prednisone 2.5 mg qd since surgery, along with tacrolimus. He was transitioned from insulin gtt to sq POD1, while still NPO. NPH 15u BID achieved BG 130-200 at that time. However, once he started tube feeds, glucoses rose to 300s so he was switched back to insulin gtt on 7/18. He is currently on continuous tube feeds at goal rate, comprising 324 gm CHO per day.     Interval history: pt hemodynamically stable.  Transferred from ICU to floor. To resume TF.     All Medications:     Current Facility-Administered Medications   Medication Dose Route Frequency Provider Last Rate Last Dose   ??? acetaminophen (TYLENOL) solution 650 mg  650 mg PEG Tube Q4H PRN Lysle Rubens, MD   650 mg at 11/23/16 2303   ??? amiodarone (PACERONE) tablet 200 mg  200 mg PEG Tube Daily Lysle Rubens, MD   200 mg at 11/23/16 0930   ???  cycloSPORINE (NEORAL) microemulsion solution 200 mg  200 mg PEG Tube BID Lysle Rubens, MD   200 mg at 11/24/16 1610   ??? dextrose 50 % solution 12.5 g  12.5 g Intravenous Q10 Min PRN Jonell Cluck, MD   12.5 g at 10/29/16 1145   ??? digoxin (LANOXIN) tablet 62.5 mcg  62.5 mcg PEG Tube Daily Lysle Rubens, MD   62.5 mcg at 11/23/16 0930   ??? docusate (COLACE) 50 mg/5 mL liquid 100 mg  100 mg G-tube BID Merri Brunette, MD   100 mg at 11/23/16 2258   ??? insulin NPH (HumuLIN,NovoLIN) injection 6 Units  6 Units Subcutaneous BID Trellis Paganini [place], [place]   6 Units at 11/24/16 0631   ??? insulin regular (HumuLIN,NovoLIN) injection 0-12 Units  0-12 Units Subcutaneous Q6H [facility] Denzil Magnuson, MD   2 Units at 11/24/16 9604   ??? insulin regular (HumuLIN,NovoLIN) injection 4 Units  4 Units Subcutaneous Q6H [facility] Denzil Magnuson, MD   4 Units at 11/24/16 5409   ??? ipratropium (ATROVENT) 0.02 % nebulizer solution 500 mcg  500 mcg Nebulization Q6H PRN Jonell Cluck, MD   500 mcg at 11/15/16 2008   ??? labetalol (NORMODYNE,TRANDATE) injection 20 mg  20 mg Intravenous Q6H PRN Trellis Paganini [place], [place]   20 mg at 11/05/16 1752   ??? lidocaine (LIDODERM) 5 % patch 1 patch  1 patch Transdermal Daily Julio Alm, MD   1 patch at 11/23/16 0930   ??? magnesium sulfate 2gm/1mL IVPB  2 g Intravenous Q2H PRN Merri Brunette, MD       ??? melatonin tablet 3 mg  3 mg PEG Tube Nightly Lysle Rubens, MD   3 mg at 11/23/16 2258   ??? mycophenolate (CELLCEPT) 200 mg/mL suspension 500 mg  500 mg NG tube BID Lysle Rubens, MD   500 mg at 11/24/16 8119   ??? ocular lubricant (GENTEAL SEVERE) 0.3 % ophthalmic gel 1 drop  1 drop Both Eyes TID PRN Berenda Morale, MD   1 drop at 09/30/16 1619   ??? OLANZapine (ZYPREXA) tablet 2.5 mg  2.5 mg PEG Tube BID PRN Lysle Rubens, MD       ??? pantoprazole (PROTONIX) granules 40 mg  40 mg G-tube Daily Lysle Rubens, MD   40 mg at 11/23/16 0930   ??? polyethylene glycol (MIRALAX) packet 17 g  17 g G-tube Daily Merri Brunette, MD   17 g at 11/23/16 0930   ??? saliva stimulant mucosal spray  1 spray Topical Q2H PRN Lysle Rubens, MD       ??? sildenafil (antihypertensive) (REVATIO) 10 mg/mL oral suspension 30 mg  30 mg PEG Tube [facility] Lysle Rubens, MD   30 mg at 11/24/16 1478   ??? sulfamethoxazole-trimethoprim (BACTRIM,SEPTRA) 200-40 mg/5 mL suspension 10 mL  10 mL PEG Tube Once per day on Mon Wed Fri Lysle Rubens, MD   10 mL at 11/23/16 0930   ??? thiamine (B-1) tablet 100 mg  100 mg PEG Tube Daily Lysle Rubens, MD   100 mg at 11/23/16 0930   ??? traMADol (ULTRAM) tablet 50 mg  50 mg PEG Tube Q6H PRN Lysle Rubens, MD   50 mg at 11/23/16 2258   ??? valGANciclovir (VALCYTE) 50 mg/mL oral solution 450 mg  450 mg PEG Tube Daily Lysle Rubens, MD   450 mg at 11/23/16 0930   ??? warfarin (COUMADIN) tablet 3 mg  3 mg PEG Tube Daily Trellis Paganini [place], [place]  3 mg at 11/23/16 1835     Review of Systems: Unable to complete due to patient factors.    Objective: :    Patient Vitals for the past 8 hrs:   BP Temp Temp src Pulse Resp SpO2   11/24/16 0344 131/63 36.6 ??C (97.9 ??F) Oral 85 18 100 %   11/24/16 0026 - - - 86 - -   11/23/16 2329 137/63 36.4 ??C (97.5 ??F) Oral 88 18 92 %       No intake/output data recorded.    Physical Exam:  General appearance - NAD, lying in bed  ENT- NGT in place  Resp - Trach collar,  no increased WOB  Neuro - sleeping  Psych- normal affect    Test Results    Glucose POC all reviewed.        Data Review  Lab Results   Component Value Date    A1C 8.0 (H) 09/14/2016    A1C 7.8 (H) 09/05/2016    A1C 8.5 (H) 07/20/2016     Lab Results   Component Value Date    GFR >= 60 02/15/2012    CREATININE 0.90 11/24/2016

## 2016-11-24 NOTE — Unmapped (Deleted)
Physical Medicine and Rehab  H&P [facility]     ASSESSMENT:     Jeffrey Ward is a 66 y.o. male admitted to [facility] for physical rehabilitation s/p liver transplant (09/15/16).    Primary Rehab diagnosis: (Debility) 16 Debility (Non-Cardiac/Non-Pulmonary)    Secondary diagnoses:   Principal Problem:    S/P liver transplant (CMS-HCC)  Active Problems:    Atrial fibrillation with RVR (CMS-HCC)    Pulmonary hypertension, moderate to severe (CMS-HCC)    Acute right heart failure (CMS-HCC)    Tracheostomy dependent (CMS-HCC)    Mixed level of activity delirium due to multiple etiologies, persistent    PLAN:     REHAB:   - PT and OT to maximize functional status with mobility and ADLs as well as prevention of joint contracture.   - SLP for cognitive and swallow function.  - RT for community re-integration, education, and leisure support services.  - Pharmacy consult for patient and family education on medication management.   - To be discussed in weekly Interdisciplinary Team Conference.    S/p Liver transplant (09/15/16) For chronic hep C and HCC: Liver ultrasound on 11/03/16 showing mild to moderate intrahepatic biliary duct dilation which appeared to be new from prior ultrasound. Liver biopsy on 11/04/16 did not show acute rejection. Transplant will continue following closely while in acute inpatient rehabilitation.  - f/u transplant recs  - Continue cyclosporine, CellCept  - continue Valcyte, Bactrim for ppx    GIB: Patient had GI bleed from an ulcer which was clipped on 11/20/16 by gastroenterology.   - Protonix 40 mg BID    Paroxismal A-fib: Patient on digoxin, amiodarone, warfarin for a-fib. Patient recently had GI bleed and warfarin was restarted on 9/11. Most recent INR was 1.69 on 11/24/16. Goal INR is 2-3 per Dr. Azucena Cecil. Per Dr. Azucena Cecil the decision was made between transplant and GI that the patient did not need to be on heparin due to bleeding risk and does not need prophylaxis while going back onto warfarin.  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    Right Heart Failure. Stable: Patient reportedly had right heart failure postop after transplant.     Severe PAH: The patient's wife reports that he was told that he had pulmonary hypertension prior to admission to hospital. 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 Patient still has when necessary Zyprexa on. Psychiatry has been following and recommends twice weekly EKGs and repleting magnesium to between 2 and 4. The patient's delirium is reportedly improving prior to admission to acute inpatient rehabilitation  - Replete magnesium to between 2 and 4  - Hold Zyprexa  - EKGs weekly    Dysphonia with dysphagia: The patient has been intubated multiple times throughout his hospitalization. Voice has been hoarse/hypophonic since intubations. Will consider ENT referral if it does not improve within a few days of admission to acute inpatient rehabilitation.  - currently on tube feeds  - consider ENT consult    Nausea/Vomiting: Patient had nausea and vomiting with tube feeds as recently as the morning of 11/24/16. This was thought to be due to high residuals. Tube feed rate was slowed. We'll continue to monitor for  symptoms.    Distal LE weakness/dysesthesias: Patient with left lower extremity weakness and numbness on the dorsal aspect of the left foot since surgery. He reports that he has not developed some numbness to the right great toe. 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 Could also have critical illness polyneuropathy/polyneuropathy though this would be expected to be more proximal than distal and symmetric. May consider EMG/NCS if this does not improve.    FEN/PPX:  - DVT ppx: High risk for VTE given immobility. Per Dr. Azucena Cecil with transplant, the decision was made between transplant and GI that the patient should restart warfarin without any heparin or other DVT prophylaxis given bleeding risk.  - Diet: NPO  - Tube feeds via Gtube with Jevity 1.5 formula @50ml /h continuous and 50cc FWF before and after each bolus.    DISPO: Admitted to Rehab floor, patient will be discussed at next interdisciplinary team conference.     Estimated Length of Stay: 12-14 days    Anticipated Post-Rehab Destination / Needs: home    Medical Necessity:  The patient requires acute inpatient rehabilitation to maximize functional independence and requires daily physician visits for monitoring/management of cardiopulmonary function s/p intubation and tracheostomy now decannulated, liver function s/p Liver transplant on immunosuppression, vital signs, anticoagulation, neurological exam, delirium, medications, skin and pain control. This patient's rehabilitation goals and medical complexity could not adequately be managed in a less intensive setting.  Potential risks for clinical complications include falls, adverse medication reactions, DVT/PE, pressure ulcers, aspiration, urinary tract infection, dehydration/malnutrition, respiratory failure, worsening of underlying disease and bleeding.  ??  Medical Prognosis: Good for continued progress and participation with therapy.  ??  Anticipated Interdisciplinary Rehabilitation Interventions: Activity tolerance: Patient is expected to tolerate minimum of 3 hrs of therapy daily @ 5x/week. Physical therapy to work on mobility. Occupational therapy to work on self care. Speech therapy to work on speech and swallowing. Recreational therapy for community reintegration and relaxation training. Prosthetics & Orthotics for bracing as indicated. Rehab Nursing to work on medication administration, patient/family education, skin care, fall prevention, bowel and bladder management, vital signs and feeding/nutrition. Weekly interdisciplinary team conference to assess progress and plan of care changes.  ??  Expected Functional Outcomes:  Expected level of improvement: Goals for inpatient rehabilitation are mod I for functional mobility, ambulation with appropriate assistive devices and mod I to supervision basic ADLs upper and lower limbs.    SUBJECTIVE:     Reason for Admission: Comprehensive interdisciplinary inpatient rehabilitation program.    History of Present Illness: Jeffrey Ward is a 66 y.o. male with PMHx as noted below that presents to [facility] for Physical rehabilitation after a liver transplant and long hospitalization.    Brief hospital course below quoted from Dr. Louie Boston note on 11/24/16    Adrean Mabie??is a 66 y.o.??male??with a history of IDDM, GERD, prior perioperative a-fib with RVR and 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 Post-op course complicated by decompensation thought to be secondary to acute right heart failure and AKI requiring CRRT (resolved). 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 Severe PAH diagnosed. Eventually had episodes of a. Fib/tachyarrythmia x3, converted with amiodarone, then with digoxin; Cardiology following closely. On 11/03/16 he had a significant spike in his LFTs suspicious for acute rejection; however liver biopsy from 11/04/16 did not show any evidence of rejection.   ??  Underwent  G tube on 11/17/2016.  Hep gtt restarted for bridging to coumadin. Developed GIB on 9/9 and underwent intubation for EGD. Small ulcer at GEJ was injected with epinephrine and clipped.     Today, the patient reports she is doing well. He states that he has not had black stools in the past few days. He also denies any red blood in his stool, blood in his vomit, abdominal pain, chills. The patient admits to nausea and vomiting this morning which were attributed to his tube feeds. The patient states that his bowels and bladder have been moving regularly since his catheter was removed. The patient states that he feels like his legs are weak since his transplant. He states that his arms do not feel weak. He states that his left leg feels weaker than his right. He also states that his left foot is numb which she localizes to the dorsal aspect of his left foot. He states that his right great toe has started to become numb over the past day or 2 but his left foot numbness has been there since his surgery.    Pre-Morbid Functional Status: Pre-Admission Functional Status:   Presumed independent Activities of Daily Living:   Bathing: set up & supervision in sitting; min assist if standing to wash perineal/buttocks-simulated task  Grooming: set up in sitting  Dressing: UB=set up; LB=min assist.  Pt able to complete figure four position w/ RLE to doff/donn sock, unable with LLE 2/2 c/o increased back pain therefore completed via forward flexion, no c/o abdominal pain.  Pt required min assist for dynamic standing balance when simulating pulling clothing up/down.  Eating: total assist-tube feeds  Toileting: min assist for dynamic standing balance during clothing management  IADLs: NT  ??  Mobility:   Bed Mobility: Supine to sit with min A for log rolling    Transfers:Sit<>stand with CGA using RW   Balance: MIN A dynamic standing balance  Gait:Ambulated x 50 ft with min A for steadying and min A for control of walker along with min VCs due to decreased safety awareness and fast pace   ??  Cognition, Swallow, Speech: AOx4, clear speech, following commands, NPO; enteral nutrition   ??  Patient's Judgement Adequate to Safely Complete Daily Activities: Yes  Patient's Memory Adequate to Safely Complete Daily Activities: Yes  Patient Able to Express Needs/Desires: Yes  Patient has speech problem: No  ??  Assistive Devices: None                 Precautions:  Protective precautions, Seizure precautions, falls precautions, skin integrity     Medical / Surgical History:   Past Medical History:   Diagnosis Date   ??? Cancer (CMS-HCC)    ??? Cirrhosis (CMS-HCC)    ??? Diabetes (CMS-HCC)    ??? Hepatitis C 07/17/2012   ??? Liver disease    ??? Low back pain 07/17/2012   ??? Varices, esophageal (CMS-HCC)      Past Surgical History:   Procedure Laterality Date   ??? ANKLE SURGERY     ??? BACK SURGERY     ??? BACK SURGERY     ??? CHG US GUIDE, TISSUE ABLATION N/A 01/22/2016    Procedure: ULTRASOUND GUIDANCE FOR, AND MONITORING OF, PARENCHYMAL TISSUE ABLATION;  Surgeon: Particia Nearing, MD;  Location: MAIN OR [facility];  Service: Transplant   ??? IR EMBOLIZATION ORGAN ISCHEMIA, TUMORS, INFAR  06/16/2016    IR EMBOLIZATION ORGAN ISCHEMIA, TUMORS, INFAR 06/16/2016 Ammie Dalton, MD IMG VIR  [facility]   ??? PR COLON CA SCRN NOT HI RSK IND N/A 02/27/2015    Procedure: COLOREC CNCR SCR;COLNSCPY NO;  Surgeon: Vonda Antigua, MD;  Location: GI PROCEDURES MEMORIAL [facility];  Service: Gastroenterology   ??? PR ENDOSCOPIC ULTRASOUND EXAM N/A 02/27/2015    Procedure: UGI ENDO; W/ENDO ULTRASOUND EXAM INCLUDES ESOPHAGUS, STOMACH, &/OR DUODENUM/JEJUNUM;  Surgeon: Vonda Antigua, MD;  Location: GI PROCEDURES MEMORIAL [facility];  Service: Gastroenterology   ??? PR INSER HEART TEMP PACER ONE CHMBR N/A 10/02/2016    Procedure: Tempoarary Pacemaker Insertion;  Surgeon: Meredith Leeds, MD;  Location: [facility];  Service: Cardiology   ??? PR LAP,DIAGNOSTIC ABDOMEN N/A 01/22/2016    Procedure: Laparoscopy, Abdomen, Peritoneum, & Omentum, Diagnostic, W/Wo Collection Specimen(S) By Brushing Or Washing;  Surgeon: Particia Nearing, MD;  Location: MAIN OR [facility];  Service: Transplant   ??? PR PLACE PERCUT GASTROSTOMY TUBE N/A 11/17/2016    Procedure: UGI ENDO; W/DIRECTED PLCMT PERQ GASTROSTOMY TUBE;  Surgeon: Cletis Athens, MD;  Location: GI PROCEDURES [facility];  Service: Gastroenterology   ??? PR TRACHEOSTOMY, PLANNED N/A 09/29/2016    Procedure: TRACHEOSTOMY PLANNED (SEPART PROC);  Surgeon: Katherina Mires, MD;  Location: MAIN OR [facility];  Service: Trauma   ??? PR TRANSCATH INSERT OR REPLACE LEADLESS PM VENTR N/A 10/11/2016    Procedure: Pacemaker Implant/Replace Leadless;  Surgeon: Meredith Leeds, MD;  Location: [facility];  Service: Cardiology   ??? PR TRANSPLANT LIVER,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    Procedure: Liver Allotransplantation; Orthotopic, Partial Or Whole, From Cadaver Or Living Donor, Any Age;  Surgeon: Doyce Loose, MD;  Location: MAIN OR [facility];  Service: Transplant   ??? PR TRANSPLANT,PREP DONOR LIVER, WHOLE N/A 09/15/2016    Procedure: Rogelia Boga Std Prep Cad Donor Whole Liver Gft Prior Tnsplnt,Inc Chole,Diss/Rem Surr Tissu Wo Triseg/Lobe Splt;  Surgeon: Doyce Loose, MD;  Location: MAIN OR [facility];  Service: Transplant   ??? PR UPPER GI ENDOSCOPY,BIOPSY N/A 07/17/2012    Procedure: UGI ENDOSCOPY; WITH BIOPSY, SINGLE OR MULTIPLE;  Surgeon: Alba Destine, MD;  Location: GI PROC[facility]URES [facility];  Service: Gastroenterology   ??? PR UPPER GI ENDOSCOPY,DIAGNOSIS N/A 02/04/2014    Procedure: UGI ENDO, INCLUDE ESOPHAGUS, STOMACH, & DUODENUM &/OR JEJUNUM; DX W/WO COLLECTION SPECIMN, BY BRUSH OR WASH;  Surgeon: Wilburt Finlay, MD;  Location: GI PROCEDURES MEMORIAL [facility];  Service: Gastroenterology   ??? PR UPPER GI ENDOSCOPY,LIGAT VARIX N/A 11/05/2013    Procedure: UGI ENDO; W/BAND LIG ESOPH &/OR GASTRIC VARICES;  Surgeon: Wilburt Finlay, MD;  Location: GI PROCEDURES [facility];  Service: Gastroenterology     Social History:   Tobacco: Denies, never smoker    Alcohol: Denies  Illicits: Denies   Home Environment: patient lives with wife in a 1 level house, 7 steps at entry with bilateral railings, 0 step inside house  Employment: retired, previously worked Acupuncturist    Family History:   family history includes Hypertension in his mother.    Allergies: Patient has no known allergies.    Medications at Discharge from Acute Hospital: Reviewed     Your Medication List      STOP taking these medications    CENTRUM SILVER ORAL     furosemide 20 MG tablet  Commonly known as:  LASIX     insulin degludec 200 unit/mL (3 mL) Inpn     lancing device Misc     levoFLOXacin 500 MG tablet  Commonly known as:  LEVAQUIN  pen needle, diabetic 31 gauge x 5/16 Ndle     rifAXIMin 550 mg Tab  Commonly known as:  XIFAXAN     spironolactone 50 MG tablet  Commonly known as:  ALDACTONE     sucralfate 1 gram tablet  Commonly known as:  CARAFATE     VITAMIN D3 2,000 unit Cap  Generic drug:  cholecalciferol (vitamin D3)        START taking these medications    acetaminophen 650 mg/20.3 mL Soln  Commonly known as:  TYLENOL  20.3 mL (650 mg total) by PEG Tube route every four (4) hours as needed.     albuterol 2.5 mg/0.5 mL nebulizer solution  Inhale 0.5 mL (2.5 mg total) by nebulization every six (6) hours as needed for wheezing.     amiodarone 200 MG tablet  Commonly known as:  PACERONE  1 tablet (200 mg total) by G-tube route daily.     cycloSPORINE 100 mg/mL microemulsion solution  Commonly known as:  NEORAL  2 mL (200 mg total) by G-tube route Two (2) times a day.     digoxin 62.5 mcg Tab  65 mcg by G-tube route daily.     docusate 50 mg/5 mL liquid  Commonly known as:  COLACE  10 mL (100 mg total) by G-tube route Two (2) times a day.     insulin NPH 100 unit/mL injection  Commonly known as:  HumuLIN,NovoLIN  Inject 0.06 mL (6 Units total) under the skin Two (2) times a day (at 6am and 6pm).     insulin regular 100 unit/mL injection  Commonly known as:  HumuLIN,NovoLIN  Inject 0-0.12 mL (0-12 Units total) under the skin every six (6) hours.     insulin regular 100 unit/mL injection  Commonly known as:  HumuLIN,NovoLIN  Inject 0.04 mL (4 Units total) under the skin every six (6) hours.     melatonin 3 mg Tab  1 tablet (3 mg total) by PEG Tube route nightly.     mycophenolate 200 mg/mL suspension  Commonly known as:  CELLCEPT  2.5 mL (500 mg total) by G-tube route Two (2) times a day (8am and 8pm).     pantoprazole 40 mg Grps  Commonly known as:  PROTONIX  1 packet (40 mg total) by G-tube route daily.     polyethylene glycol 17 gram packet  Commonly known as:  MIRALAX  17 g by G-tube route two (2) times a day as needed.     sildenafil (antihypertensive) 10 mg/mL Susr oral suspension  Commonly known as:  REVATIO  3 mL (30 mg total) by PEG Tube route every eight (8) hours.     sulfamethoxazole-trimethoprim 200-40 mg/5 mL suspension  Commonly known as:  BACTRIM,SEPTRA  10 mL by G-tube route 3 (three) times a week.     thiamine 100 MG tablet  Commonly known as:  B-1  1 tablet (100 mg total) by G-tube route daily.     traMADol 50 mg tablet  Commonly known as:  ULTRAM  1 tablet (50 mg total) by G-tube route every six (6) hours as needed. for up to 5 days     valGANciclovir 50 mg/mL Solr  Commonly known as:  VALCYTE  9 mL (450 mg total) by PEG Tube route daily.     warfarin 3 MG tablet  Commonly known as:  COUMADIN  1 tablet (3 mg total) by G-tube route daily with evening meal.  Review of Systems:    Full 10 systems reviewed and negative, other than as noted in the HPI.    OBJECTIVE:     Vitals:  Temp:  [35.4 ??C-36.7 ??C] 36.5 ??C  Heart Rate:  [75-88] 80  Resp:  [17-18] 18  BP: (131-142)/(61-66) 142/66  MAP (mmHg):  [92-95] 95  SpO2:  [92 %-100 %] 97 %    Physical Exam:  GEN: NAD laying in bed  EYES: EOMI  HENT: Hearing adequate for conversation, soft hoarse voice  RESP: NWOB on 2L O2 [place]  ABD: soft, NTND  SKIN: no visible rashes   MSK: no notable contractures  NEURO:   Mental Status: A&Ox3, can follow 3 step commands, speech fluid and coherent, follows commands well and answers questions appropriately   Sensory: Hypoesthesia to soft touch on the dorsal left foot to the crease in the ankle. Sensation to soft touch intact throughout bilateral upper and lower extremities otherwise including the plantar aspects of both feet   Motor:    - RUE: grip 5/5, finger abduction 5/5, we 5/5, ef 5/5, ee 5/5, shoulder abd 5/5   - LUE: grip 5/5, finger abduction 5/5, we 5/5, ef 5/5, ee 5/5, shoulder abd 5/5   - RLE: hf 5/5 df 4+/5, pf 4+/5, EHL 0/5, ke 5/5,    - LLE:  hf 4/5, df 4-/5, pf 4-/5, EHL 0/5 ke 4/5,    Tone: within normal limits, no spasticity noted  PSYCH: mood euthymic, affect appropriate, thought process logical     Labs and Diagnostic Studies: Reviewed  CBC:    Recent Labs  Lab Units 11/24/16  0512 11/23/16  0459 11/22/16  0550   WBC 10*9/L 3.0* 2.5* 3.4*   RBC 10*12/L 2.74* 2.84* 2.76*   HEMOGLOBIN g/dL 8.7* 8.5* 8.5*   HEMATOCRIT % 25.4* 26.5* 25.3*   MCV fL 92.6 93.5 91.5   MCH pg 31.8 30.1 30.9   MCHC g/dL 16.1 09.6 04.5   RDW % 22.5* 21.6* 22.8*   PLATELET COUNT (1) 10*9/L 95* 118* 101*   MPV fL 8.5 8.1 9.0     CMP:    Recent Labs  Lab Units 11/24/16  0512 11/23/16  0459 11/22/16  0550   SODIUM mmol/L 141 141 139   POTASSIUM mmol/L 3.4* 3.7 3.7   CHLORIDE mmol/L 100 102 104   CO2 mmol/L 34.0* 32.0* 30.0   BUN mg/dL 24* 23* 20   CREATININE mg/dL 4.09 8.11 9.14   GLUCOSE mg/dL 782 956 213   CALCIUM mg/dL 8.9 9.0 9.0   MAGNESIUM mg/dL 2.0 1.4* 1.6   PHOSPHORUS mg/dL 3.2 3.4 3.7   ALBUMIN g/dL 2.7* 2.8* 2.7*   PROTEIN TOTAL g/dL 4.8* 5.0* 4.8*   BILIRUBIN TOTAL mg/dL 2.0* 1.0 1.1   ALK PHOS U/L 295* 160* 164*   ALT U/L 64 37 43   AST U/L 104* 22 19       COAGS:  Recent Labs  Lab Units 11/24/16  0512 11/23/16  0459 11/22/16  0550  11/20/16  0512 11/19/16  1700 11/19/16  0845   INR  1.69 1.74 2.32  < > 1.96  --   --    PT sec 19.3* 19.8* 26.5*  < > 22.3*  --   --    APTT sec  --   --   --   --  136.7* 145.0* 186.2*   < > = values in this interval not displayed.    Radiology Results: Reviewed  Delane Ginger, DO  [facility] Physical Medicine and Rehabilitation

## 2016-11-24 NOTE — Unmapped (Signed)
Facility Information: [facility]  Facility Medicare provider number: [id number]      Physical Medicine and Rehabilitation  PMR Rehab Pre-Admit Screening [facility]  Date: 11/24/2016   Time: 12:20 PM     Patient Information    Patient Name:  Jeffrey Ward Medical Record Number: [id number]   Address:  [address], [place] [address] Sex: Male   Date of Birth: May 19, 1950 Age: 66 y.o.   Room/Bed:  5204/5204-01    ____________________________________________________________________________________  Attending Physician's Review and Admission Determination   Medical Necessity:  The patient requires acute inpatient rehabilitation to maximize functional independence and requires daily physician visits for monitoring/management of cardiopulmonary function s/p intubation and tracheostomy now decannulated, liver function s/p Liver transplant on immunosuppression, vital signs, anticoagulation, neurological exam, delirium, medications, skin and pain control. This patient's rehabilitation goals and medical complexity could not adequately be managed in a less intensive setting.  Potential risks for clinical complications include falls, adverse medication reactions, DVT/PE, pressure ulcers, aspiration, urinary tract infection, dehydration/malnutrition, respiratory failure, worsening of underlying disease and bleeding.    Medical Prognosis: Good for continued progress and participation with therapy.    Anticipated Interdisciplinary Rehabilitation Interventions: Activity tolerance: Patient is expected to tolerate minimum of 3 hrs of therapy daily @ 5x/week. Physical therapy to work on mobility. Occupational therapy to work on self care. Speech therapy to work on speech and swallowing. Recreational therapy for community reintegration and relaxation training. Prosthetics & Orthotics for bracing as indicated. Rehab Nursing to work on medication administration, patient/family education, skin care, fall prevention, bowel and bladder management, vital signs and feeding/nutrition. Weekly interdisciplinary team conference to assess progress and plan of care changes.    Expected Functional Outcomes:  Expected level of improvement: Goals for inpatient rehabilitation are mod I for functional mobility, ambulation with appropriate assistive devices and mod I to supervision basic ADLs upper and lower limbs.    Arneta Cliche, DO 11/24/2016 12:58 PM    _____________________________________________________________________________________  Advanced Directives:    Does patient have an advance directive covering medical treatment?: Patient does not have advance directive covering medical treatment.  Type of advance directive: : Health care power of attorney, Living will  Advance directive covering medical treatment not in Chart:: Copy requested from family  Surrogate decision maker appointed:: No  Information provided on advance directive:: No  Patient requests assistance:: No    Coverage Information  Healthcare Coverage:    Authorization number:    Authorization Code:  Activation Code has been used.  Payor: MEDICARE / Plan: MEDICARE PART A AND PART B / Product Type: *No Product type* /     Physician/Referral Information   Referring Facility[facility]    Attending Physician: Doyce Loose,*  Referring Case Manager: Justice Britain    Contact Information:   Accompanied by: Alone  Password: eli  Family Accommodations: wife  Contact Details: Primary Contact  Primary Contact Name: Viktor Philipp  Primary Contact Relationship: Spouse  Phone #1: [phone]  Secondary Contact Name: Lucendia Herrlich  Secondary Contact Relationship: Other (Comment) (sister-in-law)  Phone #3: [phone]    Prior Living Situation:   Living environment: House  Lives With: Spouse  Home Living: One level home, Grab bars around toilet, Grab bars in shower, Walk-in shower, Garden tub, Built-in shower seat, Standard height toilet, Stairs to enter with rails  Rail placement (outside): Bilateral rails  Number of Stairs: 7    Prior Level of Function: Presume independent, no family present and pt unable to participate in  interview         Rehabilitation Diagnosis    Etiologic Diagnosis / Description:  [name] is a 66 y.o. 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 Post-op course was complicated by decompensation thought to be secondary to acute right heart failure and AKI requiring CRRT, bradycardia requiring pacemaker placement, and severe PAH. Mr. Zywicki has participated in acute inpatient physical and occupational therapies to improve functional mobility, activity tolerance, functional strength, balance, and endurance in order to facilitate safe performance of ADLs and daily routines.  He has participated in acute inpatient speech-language therapy for executive functioning, expressive speech, receptive speech, and dysphagia management. Mr. Mendolia has been referred to [facility] for continued acute medical management, provision of intensive inpatient therapies, and patient/family training to facilitate safe performance of ADLs and mobility, including functional cognition and self-care, prior to discharge home.    Impairment group [facility]): (Debility) 16 Debility (Non-Cardiac/Non-Pulmonary)    Date of Onset: 09/14/16    Date of Surgery: 11/17/16    Comorbid conditions:    Patient Active Problem List    Diagnosis Date Noted   ??? 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 Pulmonary hypertension, moderate to severe (CMS-HCC) 10/03/2016   ??? Acute right heart failure (CMS-HCC) 10/03/2016   ??? 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 S/P liver transplant (CMS-HCC) 09/15/2016   ??? Hepatic encephalopathy (CMS-HCC) 07/27/2016   ??? End-stage liver disease (CMS-HCC) 02/09/2016   ??? HCC (hepatocellular carcinoma) (CMS-HCC) 02/09/2016   ??? Right ventricular dilation 01/29/2016   ??? Paroxysmal atrial fibrillation (CMS-HCC) 01/28/2016   ??? Atrial fibrillation with RVR (CMS-HCC) 01/27/2016   ??? Secondary esophageal varices (CMS-HCC) 01/27/2016   ??? Anemia 12/22/2015   ??? Benign prostatic hyperplasia 12/22/2015   ??? History of gastroesophageal reflux (GERD) 12/22/2015   ??? History of substance abuse 12/22/2015   ??? Microscopic hematuria 12/22/2015   ??? Thrombocytopenia (CMS-HCC) 12/22/2015   ??? Vitamin D deficiency 12/22/2015   ??? Intermittent vertigo 08/11/2015   ??? History of hepatitis C virus infection 07/22/2015   ??? Hyperglycemia, unspecified 07/22/2015   ??? Dysrhythmia 07/15/2014   ??? Calculus of ureter 04/20/2013   ??? Hematuria 04/18/2013   ??? Chronic pain disorder 01/09/2013   ??? Postlaminectomy syndrome, lumbar region 01/09/2013   ??? Hepatitis C 07/17/2012   ??? Low back pain 07/17/2012       Medical/functional conditions requiring inpatient rehabilitation:  Patient requires monitoring of labwork, electrolytes, blood pressure and monitoring/management of medical diagnosis. Medical management/administration of antiarrhythmics, immunosuppressants, antihypertensives, diabetic agents, anticoagulants, analgesics, and other prescribed/necessary medications.  Monitoring/management of pain, cardiopulmonary status, renal status, gastrointestinal status, neurological status, infection, bleeding, bowel and bladder, DVT, and skin breakdown.     Risk for medical/clinical complications: Patient at increased risk for complications of cardiopulmonary insufficiency, renal insufficiency, hypertensive crisis, gastrointestinal insufficiency, neurological decline, seizures, fluid volume overload, aspiration, bleeding, infection, uncontrolled pain, bowel/bladder retention, falls, and skin breakdown.    Special Rehabilitation Needs  IV  Lines/Tubes and Drains: PIV L hand, PEG Tube        Pressure Ulcer(s)    Active Pressure Ulcer     None Ventilation/Oxygen Therapy (24hrs): SpO2 95% on room air    Hearing Status: Hearing - Right Ear: Functional; Hearing - Left Ear: Functional    Visual Status:  Patient's Vision Adequate to Safely Complete Daily Activities: Yes    Cultural Considerations:  Pt at increased risk of anxiety/depression related to recent medical status change    Requires Modified Schedule:  Pt may require rest breaks between therapy sessions    Special Equipment: Supplemental oxygen, Vascular access (PIV, TLC, Port-a-cath, PICC)    Precautions:  Protective, Falls, Skin Integrity    Diet:  NPO, enteral nutrition     Diet Orders  Nutrition Supplements: Other (Comment) (Nepro)  Food Intake  Percent Meals Eaten (%): NPO  Nutrition  Feeding: Able to feed self  Nutrition Information: NPO  Nutrition Supplements: Other (Comment) (Nepro)  Supplement Intake (mL): 10 mL  Snack Supplement: 0  Snack Intake: NPO  Percent Meals Eaten (%): NPO  POC GLUCOSE Results (view only): 74    Weight-Bearing Status:  Non- applicable    Past Medical History  Past Medical History:   Diagnosis Date   ??? Cancer (CMS-HCC)    ??? Cirrhosis (CMS-HCC)    ??? Diabetes (CMS-HCC)    ??? Hepatitis C 07/17/2012   ??? Liver disease    ??? Low back pain 07/17/2012   ??? Varices, esophageal (CMS-HCC)      Past Surgical History  Past Surgical History:   Procedure Laterality Date   ??? ANKLE SURGERY     ??? BACK SURGERY     ??? BACK SURGERY     ??? CHG US GUIDE, TISSUE ABLATION N/A 01/22/2016    Procedure: ULTRASOUND GUIDANCE FOR, AND MONITORING OF, PARENCHYMAL TISSUE ABLATION;  Surgeon: Particia Nearing, MD;  Location: MAIN OR [facility];  Service: Transplant   ??? IR EMBOLIZATION ORGAN ISCHEMIA, TUMORS, INFAR  06/16/2016    IR EMBOLIZATION ORGAN ISCHEMIA, TUMORS, INFAR 06/16/2016 Ammie Dalton, MD IMG VIR H&[facility]   ??? PR COLON CA SCRN NOT HI RSK IND N/A 02/27/2015    Procedure: COLOREC CNCR SCR;COLNSCPY NO;  Surgeon: Vonda Antigua, MD;  [facility];  Service: Gastroenterology ??? PR ENDOSCOPIC ULTRASOUND EXAM N/A 02/27/2015    Procedure: UGI ENDO; W/ENDO ULTRASOUND EXAM INCLUDES ESOPHAGUS, STOMACH, &/OR DUODENUM/JEJUNUM;  Surgeon: Vonda Antigua, MD;  Location: [facility]URES [facility];  Service: Gastroenterology   ??? PR INSER HEART TEMP PACER ONE CHMBR N/A 10/02/2016    Procedure: Tempoarary Pacemaker Insertion;  Surgeon: Meredith Leeds, MD;  Location: [facility];  Service: Cardiology   ??? PR LAP,DIAGNOSTIC ABDOMEN N/A 01/22/2016    Procedure: Laparoscopy, Abdomen, Peritoneum, & Omentum, Diagnostic, W/Wo Collection Specimen(S) By Brushing Or Washing;  Surgeon: Particia Nearing, MD;  Location: MAIN OR [facility];  Service: Transplant   ??? PR PLACE PERCUT GASTROSTOMY TUBE N/A 11/17/2016    Procedure: UGI ENDO; W/DIRECTED PLCMT PERQ GASTROSTOMY TUBE;  Surgeon: Cletis Athens, MD;  Location: GI PROC[facility]URES [facility];  Service: Gastroenterology   ??? PR TRACHEOSTOMY, PLANNED N/A 09/29/2016    Procedure: TRACHEOSTOMY PLANNED (SEPART PROC);  Surgeon: Katherina Mires, MD;  Location: MAIN OR [facility];  Service: Trauma   ??? PR TRANSCATH INSERT OR REPLACE LEADLESS PM VENTR N/A 10/11/2016    Procedure: Pacemaker Implant/Replace Leadless;  Surgeon: Meredith Leeds, MD;  Location: [facility];  Service: Cardiology   ??? PR TRANSPLANT LIVER,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    Procedure: Liver Allotransplantation; Orthotopic, Partial Or Whole, From Cadaver Or Living Donor, Any Age;  Surgeon: Doyce Loose, MD;  Location: MAIN OR [facility];  Service: Transplant   ??? PR TRANSPLANT,PREP DONOR LIVER, WHOLE N/A 09/15/2016  Procedure: Rogelia Boga Std Prep Cad Donor Whole Liver Gft Prior Tnsplnt,Inc Chole,Diss/Rem Surr Tissu Wo Triseg/Lobe Splt;  Surgeon: Doyce Loose, MD;  Location: MAIN OR [facility];  Service: Transplant   ??? PR UPPER GI ENDOSCOPY,BIOPSY N/A 07/17/2012    Procedure: UGI ENDOSCOPY; WITH BIOPSY, SINGLE OR MULTIPLE;  Surgeon: Alba Destine, MD;  Location: [facility]; Service: Gastroenterology   ??? PR UPPER GI ENDOSCOPY,DIAGNOSIS N/A 02/04/2014    Procedure: UGI ENDO, INCLUDE ESOPHAGUS, STOMACH, & DUODENUM &/OR JEJUNUM; DX W/WO COLLECTION SPECIMN, BY BRUSH OR WASH;  Surgeon: Wilburt Finlay, MD;  Location: [facility]URES [facility];  Service: Gastroenterology   ??? PR UPPER GI ENDOSCOPY,LIGAT VARIX N/A 11/05/2013    Procedure: UGI ENDO; W/BAND LIG ESOPH &/OR GASTRIC VARICES;  Surgeon: Wilburt Finlay, MD;  Location: [facility]URES [facility];  Service: Gastroenterology     Family History  Family History   Problem Relation Age of Onset   ??? Hypertension Mother    ??? Cirrhosis Neg Hx    ??? Liver cancer Neg Hx      Social History:   Social History     Social History   ??? Marital status: Married     Spouse name: N/A   ??? Number of children: N/A   ??? Years of education: N/A     Social History Main Topics   ??? Smoking status: Never Smoker   ??? Smokeless tobacco: Never Used      Comment: Smoked in high school for about 5 years.    ??? Alcohol use No   ??? Drug use: No   ??? Sexual activity: Yes     Partners: Female     Birth control/ protection: None     Other Topics Concern   ??? None     Social History Narrative   ??? None     Current Facility-Administered Medications Ordered in Epic   Medication Dose Route Frequency Provider Last Rate Last Dose   ??? acetaminophen (TYLENOL) solution 650 mg  650 mg PEG Tube Q4H PRN Lysle Rubens, MD   650 mg at 11/23/16 2303   ??? albuterol nebulizer solution 2.5 mg  2.5 mg Nebulization Q6H PRN Trellis Paganini [place], [place]       ??? amiodarone (PACERONE) tablet 200 mg  200 mg PEG Tube Daily Lysle Rubens, MD   200 mg at 11/24/16 0347   ??? cycloSPORINE (NEORAL) microemulsion solution 200 mg  200 mg PEG Tube BID Lysle Rubens, MD   200 mg at 11/24/16 4259   ??? dextrose 50 % solution 12.5 g  12.5 g Intravenous Q10 Min PRN Jonell Cluck, MD   12.5 g at 10/29/16 1145   ??? digoxin (LANOXIN) tablet 62.5 mcg  62.5 mcg PEG Tube Daily Lysle Rubens, MD   62.5 mcg at 11/24/16 5638 ??? docusate (COLACE) 50 mg/5 mL liquid 100 mg  100 mg G-tube BID Merri Brunette, MD   100 mg at 11/24/16 0939   ??? insulin NPH (HumuLIN,NovoLIN) injection 6 Units  6 Units Subcutaneous BID Trellis Paganini [place], [place]   6 Units at 11/24/16 0631   ??? insulin regular (HumuLIN,NovoLIN) injection 0-12 Units  0-12 Units Subcutaneous Q6H [facility] Denzil Magnuson, MD   2 Units at 11/24/16 7564   ??? insulin regular (HumuLIN,NovoLIN) injection 4 Units  4 Units Subcutaneous Q6H [facility] Denzil Magnuson, MD   4 Units at 11/24/16 [phone]   ??? labetalol (NORMODYNE,TRANDATE) injection 20 mg  20 mg Intravenous Q6H  PRN Janalyn Shy, PA   20 mg at 11/05/16 1752   ??? lidocaine (LIDODERM) 5 % patch 1 patch  1 patch Transdermal Daily Julio Alm, MD   1 patch at 11/24/16 0939   ??? melatonin tablet 3 mg  3 mg PEG Tube Nightly Lysle Rubens, MD   3 mg at 11/23/16 2258   ??? mycophenolate (CELLCEPT) 200 mg/mL suspension 500 mg  500 mg NG tube BID Lysle Rubens, MD   500 mg at 11/24/16 0160   ??? ocular lubricant (GENTEAL SEVERE) 0.3 % ophthalmic gel 1 drop  1 drop Both Eyes TID PRN Berenda Morale, MD   1 drop at 09/30/16 1619   ??? pantoprazole (PROTONIX) granules 40 mg  40 mg G-tube Daily Lysle Rubens, MD   40 mg at 11/24/16 1093   ??? polyethylene glycol (MIRALAX) packet 17 g  17 g G-tube BID Lysle Rubens, MD   Stopped at 11/24/16 0935   ??? saliva stimulant mucosal spray  1 spray Topical Q2H PRN Lysle Rubens, MD       ??? sildenafil (antihypertensive) (REVATIO) 10 mg/mL oral suspension 30 mg  30 mg PEG Tube [facility] Lysle Rubens, MD   30 mg at 11/24/16 2355   ??? sulfamethoxazole-trimethoprim (BACTRIM,SEPTRA) 200-40 mg/5 mL suspension 10 mL  10 mL PEG Tube Once per day on Mon Wed Fri Lysle Rubens, MD   10 mL at 11/23/16 0930   ??? thiamine (B-1) tablet 100 mg  100 mg PEG Tube Daily Lysle Rubens, MD   100 mg at 11/24/16 7322   ??? traMADol (ULTRAM) tablet 50 mg  50 mg PEG Tube Q6H PRN Lysle Rubens, MD   50 mg at 11/23/16 2258 ??? valGANciclovir (VALCYTE) 50 mg/mL oral solution 450 mg  450 mg PEG Tube Daily Lysle Rubens, MD   450 mg at 11/24/16 0939   ??? warfarin (COUMADIN) tablet 3 mg  3 mg PEG Tube Daily Trellis Paganini [place], [place]   3 mg at 11/23/16 1835     Current Outpatient Prescriptions Ordered in Epic   Medication Sig Dispense Refill   ??? valGANciclovir (VALCYTE) 50 mg/mL SolR 9 mL (450 mg total) by PEG Tube route daily. 3 Bottle 0     Prescriptions Prior to Admission   Medication Sig Dispense Refill Last Dose   ??? cholecalciferol, vitamin D3, (VITAMIN D3) 2,000 unit cap Take 1 capsule by mouth once daily.   Taking   ??? FOLIC ACID/MULTIVIT-MIN/LUTEIN (CENTRUM SILVER ORAL) Take 1 tablet by mouth.   Taking   ??? furosemide (LASIX) 20 MG tablet Take 1 tablet (20 mg total) by mouth Two (2) times a day. 60 tablet 11 Taking   ??? insulin degludec 200 unit/mL (3 mL) InPn Inject 24 Units under the skin every evening.    Taking   ??? lancing device Misc 1 each.   Taking   ??? levoFLOXacin (LEVAQUIN) 500 MG tablet Take 500 mg by mouth daily.    Not Taking   ??? pen needle, diabetic 31 gauge x 5/16 Ndle Use as directed.   Taking   ??? rifAXIMin (XIFAXAN) 550 mg Tab Take 1 tablet (550 mg total) by mouth Two (2) times a day. 60 tablet 11 Taking   ??? spironolactone (ALDACTONE) 50 MG tablet Take 1 tablet (50 mg total) by mouth daily. 90 tablet 3 Taking   ??? sucralfate (CARAFATE) 1 gram tablet Take 1 g by mouth daily.    Not Taking  Vitals:    Vitals:    11/24/16 0344 11/24/16 0730 11/24/16 0731 11/24/16 1212   BP: 131/63  134/65 142/66   Pulse: 85 84 80    Resp: 18  18 18    Temp: 36.6 ??C (97.9 ??F)  36.3 ??C (97.3 ??F) 36.5 ??C (97.7 ??F)   TempSrc: Oral  Oral Oral   SpO2: 100%  95% 97%   Weight:       Height:         Height:  157 cm (5' 1.81)  Weight: 67.8 kg (149 lb 7.6 oz)     Was the influenza vaccine received in this facility during this year's vaccination season? Not applicable this time of year.      Labs:      CBC -   Lab Results   Component Value Date    WBC 3.0 (L) 11/24/2016    RBC 2.74 (L) 11/24/2016    HGB 8.7 (L) 11/24/2016    HCT 25.4 (L) 11/24/2016    MCV 92.6 11/24/2016    MCH 31.8 11/24/2016    MCHC 34.3 11/24/2016    MPV 8.5 11/24/2016    PLT 95 (L) 11/24/2016       BMP -   Lab Results   Component Value Date    NA 141 11/24/2016    K 3.4 (L) 11/24/2016    CL 100 11/24/2016    CO2 34.0 (H) 11/24/2016    BUN 24 (H) 11/24/2016    CREATININE 0.90 11/24/2016    GFR >= 60 02/15/2012    GLU 170 11/24/2016       CARD -   Lab Results   Component Value Date    CKTOTAL <20.0 (L) 10/15/2016    CKTOTAL <20.0 (L) 10/15/2016    CKMB 2.46 10/15/2016    CKMB 2.29 10/15/2016    TROPONINI 0.079 (HH) 10/13/2016       Coagulation -   Lab Results   Component Value Date    PT 19.3 (H) 11/24/2016    INR 1.69 11/24/2016    APTT 136.7 (H) 11/20/2016       ABGs-   Lab Results   Component Value Date    PHART 7.53 (H) 11/20/2016    PO2ART 186.0 (H) 11/20/2016    PCO2ART 26.4 (L) 11/20/2016    BEART -0.8 11/20/2016    HCO3ART 22 11/20/2016    O2SATART >100.0 (H) 11/20/2016       LFT's -   Lab Results   Component Value Date    ALBUMIN 2.7 (L) 11/24/2016    ALT 64 11/24/2016    AST 104 (H) 11/24/2016    ALKPHOS 295 (H) 11/24/2016    BILITOT 2.0 (H) 11/24/2016    BILIDIR 1.20 (H) 11/24/2016    PROT 4.8 (L) 11/24/2016       Current Functional Status    Activities of Daily Living:   Bathing: set up & supervision in sitting; min assist if standing to wash perineal/buttocks-simulated task  Grooming: set up in sitting  Dressing: UB=set up; LB=min assist.  Pt able to complete figure four position w/ RLE to doff/donn sock, unable with LLE 2/2 c/o increased back pain therefore completed via forward flexion, no c/o abdominal pain.  Pt required min assist for dynamic standing balance when simulating pulling clothing up/down.  Eating: total assist-tube feeds  Toileting: min assist for dynamic standing balance during clothing management  IADLs: NT    Mobility:   Bed Mobility: Supine to sit with  min A for log rolling    Transfers:Sit<>stand with CGA using RW   Balance: MIN A dynamic standing balance  Gait:Ambulated x 50 ft with min A for steadying and min A for control of walker along with min VCs due to decreased safety awareness and fast pace     Cognition, Swallow, Speech: AOx4, clear speech, following commands, NPO; enteral nutrition     Patient's Judgement Adequate to Safely Complete Daily Activities: Yes  Patient's Memory Adequate to Safely Complete Daily Activities: Yes  Patient Able to Express Needs/Desires: Yes  Patient has speech problem: No    Assistive Devices: None    Willingness to participate:  Pt willing and able to participate in three hours of therapies daily     Rehab Goals and Plan    Expected level of improvement for safe discharge: Patient will discharge home as independent as possible with ADLs and ambulation with least restrictive assistive device for household distances.     Patient / Family Goals: Patient will safely return home with family, modified independence in ADLs and assist as needed with ambulation for household distances.    Required treatments and services: Rehab nursing, Case management, Dietician/nutrition     Anticipated Interventions:    PT: 60-120 min/day 5-7 days/wk  OT: 60-120 min/day 5-7 days/wk  ST: 30-60 min/day 3-5 days/wk  REC therapy: 30 min/day 3 days/wk    Anticipated services upon discharge:     Outpatient therapy: PT, OT    Expected discharge destination: Home with spouse    Discharge support: Patient has a caregiver available    Patient/family/caregiver orientation: Patient and family agreeable to inpatient rehab plan    Estimated Length of Stay: 12-14 days     Projected Admission Date: Thursday, September 13th, 2018    Reviewer's Signature, Date and Time:   Janit Pagan, OTR/L   [facility]  Inpatient Coordinator  Office: [phone]    12:25 PM 11/24/2016

## 2016-11-24 NOTE — Unmapped (Signed)
Physical Medicine and Rehab  Inpatient Consult Note [facility]    Requesting Attending Physician: Doyce Loose,*  Service Requesting Consult: Surg Transplant [facility])    ASSESSMENT/RECOMMENDATIONS:     Jeffrey Ward is a 66 y.o. male with PMHx as noted below seen in consultation for evaluation of rehab needs and recommendations for debility following prolonged hospitalization due to multiple complications following liver transplant on 09/15/2016.          DISPO AND FURTHER REHAB/Recommendations:   - Patient has complex medical and rehabilitation needs, and he will be an appropriate candidate and benefit from Acute Inpatient  Rehabilitation.   We will plan to admit him to our unit today.  Discussed patient with primary team, and he is appropriate medically to transfer to our floor today.    - In AIR patient will be expected to tolerate minimum of 3 hrs of therapy daily, 5x/week which I believe he will be able to do so with sessions appropriately spaced.  He will require PT to advance his mobility, balance, LE strength and gait; OT to address self care and functional transfers.  SLP to address swallowing and dysphonia.  Due to the complex nature of his multiple medical co-morbidities including liver transplant, recent decannulation and GIB, he would be best served in the acute inpatient rehabilitation setting.  His liver transplant team will plan to follow him during his stay.    - If his dysphonia does not significantly improve over the next week, we will plan for ENT consult to further evaluate due to his history of multiple intubations/extubations and prolonged tracheostomy.    - He may also benefit from EMG/NCS and/or MRI lumbar spine due to his distal LE weakness, L>R with paresthesias and pain of the Left great toe and 2nd toe.  He has multiple potential etiologies for these symptoms including critical illness neuropathy/myopathy, steroid induced neuropathy, lumbar radiculopathy and multiple medications that could induce neuropathy.      FOR PATIENT'S ADMITTED TO REHAB:  All patients require a completed full Discharge Summary and a 'Discharge Readmit to Rehab' order (NOT 'Transfer' order or standard 'Discharge' order) at time of discharge to PM&R.  1. Please schedule your service's follow-up appointments and include in the discharge summary. If follow-up will be based upon discharge from Rehab date, please note.  2. List wound care instructions - dressings, ok to shower, suture/staple removal dates, etc., if applicable.  3. List drain care instructions, if applicable.  4. Note precautions and weight bearing status, if applicable.  5. Note any specific instructions for labs, antibiotic stop dates, other medications, etc.         Please page Arneta Cliche, DO from 8AM-5PM weekdays or the first call pager after hours/weekends/holidays with questions.    Thank you for this consult.  Merrily Brittle, DO  November 24, 2016 12:25 PM      SUBJECTIVE:     Reason for Consult: Patient seen in consultation at the request of Doyce Loose,* for evaluation of rehabilitation needs and recommendations due to debility.    History of Present Illness: Jeffrey Ward is a 66 y.o. male with PMHx as noted below seen in consultation regarding rehabilitation and disposition following prolonged hospital course after liver transplantation on 09/15/2016.  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 fib now on digoxin and amiodarone.  G-tube placed 9-6 complicated by GI bleed on 9/9 status post EGD requiring intubation with extubation that same day and clipping of bleed at [facility].   He was decannulated on 9/7 and is currently tolerating O2 wean on [place] 2 L O2.     Foley DC on 9/11. He reports that he has been able to void without difficulty using the urinal. He notes regular bowel movements with last BM this morning, and he was able to walk with assistance to the toilet. He has been seen by both PT and OT on 9/12 who recommended high intensity 5 times a week therapy to optimize his functional return. He reports that he was fatigued after having both sessions yesterday but he is willing to work hard. Per current transplant team, patient did vomit ??1 this morning. This was felt to be related to his tube feeds and they were adjusted. He has been requiring slow up titration of his warfarin due to his recent GI bleed. Patient also notes numbness and tingling of the left great toe and second toe over the past 2-3 weeks as well as right great toe numbness more recently. He also admits to pain at the left great toe and less so the second toe. He does not have a history of gout.  Since his hospitalization which has resulted in multiple intubation/extubation as well as tracheostomy placement, he has reported raspy, hoarse voice. Per his primary team, his voice has improved since his decannulation 9-7and extubation from the 9-9 procedure just in the interim from Monday to today.    Pre-Morbid Functional Status: Current Functional Status:   Activities of daily living: I  Mobility: I  Cognition, speech, and swallow: WNL  Assistive devices: none    Activities of daily living: Per OT note 9/12:   Bathing: set up & supervision in sitting; min assist if standing to wash perineal/buttocks-simulated task  Grooming: set up in sitting  Dressing: UB=set up; LB=min assist.  Pt able to complete figure four position w/ RLE to doff/donn sock, unable with LLE 2/2 c/o increased back pain therefore completed via forward flexion, no c/o abdominal pain.  Pt required min assist for dynamic standing balance when simulating pulling clothing up/down.  Eating: total assist-tube feeds  Toileting: min assist for dynamic standing balance during clothing management  Mobility: Min A supine to sit, sit-stand CGA, amb 50 ft min A with verbal cues to pace.   Cognition, speech, and swallow: Cognition wnl, speech dysphonic.  NPO due to dysphagia on TF  Assistive devices: RW        Precautions:  Safety Interventions  Safety Interventions: fall reduction program maintained, isolation precautions  All Alarms: none present  Aspiration Precautions: NPO pending swallow screening/evaluation  Bleeding Precautions: blood pressure closely monitored  Stabilization Measures: airway opened  Infection Management: aseptic technique maintained  Isolation Precautions: protective environment maintained  Latex Precautions: latex precautions maintained  Neutropenic Precautions: Good handwashing, Masks in room for patient use, No fresh flowers or live plants, No visitors allowed who are ill, Private room (Obtain MD order), Provide equipment that stays in room  Seizure Precautions: clutter-free environment maintained    Medical / Surgical History:   Past Medical History:   Diagnosis Date   ??? Cancer (CMS-HCC)    ??? Cirrhosis (CMS-HCC)    ??? Diabetes (CMS-HCC)    ??? Hepatitis C 07/17/2012   ??? Liver disease    ??? Low back  pain 07/17/2012   ??? Varices, esophageal (CMS-HCC)      Past Surgical History:   Procedure Laterality Date   ??? ANKLE SURGERY     ??? BACK SURGERY     ??? BACK SURGERY     ??? CHG US GUIDE, TISSUE ABLATION N/A 01/22/2016    Procedure: ULTRASOUND GUIDANCE FOR, AND MONITORING OF, PARENCHYMAL TISSUE ABLATION;  Surgeon: Particia Nearing, MD;  Location: MAIN OR [facility];  Service: Transplant   ??? IR EMBOLIZATION ORGAN ISCHEMIA, TUMORS, INFAR  06/16/2016    IR EMBOLIZATION ORGAN ISCHEMIA, TUMORS, INFAR 06/16/2016 Ammie Dalton, MD IMG VIR H&V [facility]   ??? PR COLON CA SCRN NOT HI RSK IND N/A 02/27/2015    Procedure: COLOREC CNCR SCR;COLNSCPY NO;  Surgeon: Vonda Antigua, MD;  Location: GI PROCEDURES MEMORIAL [facility];  Service: Gastroenterology   ??? PR ENDOSCOPIC ULTRASOUND EXAM N/A 02/27/2015    Procedure: UGI ENDO; W/ENDO ULTRASOUND EXAM INCLUDES ESOPHAGUS, STOMACH, &/OR DUODENUM/JEJUNUM;  Surgeon: Vonda Antigua, MD;  Location: GI PROCEDURES MEM[facility];  Service: Gastroenterology   ??? PR INSER HEART TEMP PACER ONE CHMBR N/A 10/02/2016    Procedure: Tempoarary Pacemaker Insertion;  Surgeon: Meredith Leeds, MD;  Location: [facility];  Service: Cardiology   ??? PR LAP,DIAGNOSTIC ABDOMEN N/A 01/22/2016    Procedure: Laparoscopy, Abdomen, Peritoneum, & Omentum, Diagnostic, W/Wo Collection Specimen(S) By Brushing Or Washing;  Surgeon: Particia Nearing, MD;  Location: MAIN OR [facility];  Service: Transplant   ??? PR PLACE PERCUT GASTROSTOMY TUBE N/A 11/17/2016    Procedure: UGI ENDO; W/DIRECTED PLCMT PERQ GASTROSTOMY TUBE;  Surgeon: Cletis Athens, MD;  Location: GI PROCEDURES [facility];  Service: Gastroenterology   ??? PR TRACHEOSTOMY, PLANNED N/A 09/29/2016    Procedure: TRACHEOSTOMY PLANNED (SEPART PROC);  Surgeon: Katherina Mires, MD;  Location: MAIN OR [facility];  Service: Trauma   ??? PR TRANSCATH INSERT OR REPLACE LEADLESS PM VENTR N/A 10/11/2016    Procedure: Pacemaker Implant/Replace Leadless;  Surgeon: Meredith Leeds, MD;  Location: [facility] EP;  Service: Cardiology   ??? PR TRANSPLANT LIVER,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    Procedure: Liver Allotransplantation; Orthotopic, Partial Or Whole, From Cadaver Or Living Donor, Any Age;  Surgeon: Doyce Loose, MD;  Location: MAIN OR [facility];  Service: Transplant   ??? PR TRANSPLANT,PREP DONOR LIVER, WHOLE N/A 09/15/2016    Procedure: Rogelia Boga Std Prep Cad Donor Whole Liver Gft Prior Tnsplnt,Inc Chole,Diss/Rem Surr Tissu Wo Triseg/Lobe Splt;  Surgeon: Doyce Loose, MD;  Location: MAIN OR [facility];  Service: Transplant   ??? PR UPPER GI ENDOSCOPY,BIOPSY N/A 07/17/2012    Procedure: UGI ENDOSCOPY; WITH BIOPSY, SINGLE OR MULTIPLE;  Surgeon: Alba Destine, MD;  Location: GI PROCEDURES [facility];  Service: Gastroenterology   ??? PR UPPER GI ENDOSCOPY,DIAGNOSIS N/A 02/04/2014    Procedure: UGI ENDO, INCLUDE ESOPHAGUS, STOMACH, & DUODENUM &/OR JEJUNUM; DX W/WO COLLECTION SPECIMN, BY BRUSH OR WASH;  Surgeon: Wilburt Finlay, MD;  Location: GI PROC[facility]URES [facility];  Service: Gastroenterology   ??? PR UPPER GI ENDOSCOPY,LIGAT VARIX N/A 11/05/2013    Procedure: UGI ENDO; W/BAND LIG ESOPH &/OR GASTRIC VARICES;  Surgeon: Wilburt Finlay, MD;  Location: GI PROC[facility]URES [facility];  Service: Gastroenterology      Social History:   Social History   Substance Use Topics   ??? Smoking status: Never Smoker   ??? Smokeless tobacco: Never Used      Comment: Smoked in high school for about 5 years.    ??? Alcohol  use No     Home Environment: patient lives with wife in a 1 level house, 7 steps at entry with bilateral railings, 0 step inside house, bathroom has a walk-in shower and standard commode.  Wife also retired and available to assist as needed.   Employment: retired, previously worked Acupuncturist      Family History:   family history includes Hypertension in his mother.    Allergies:   Patient has no known allergies.    Medications:   Scheduled   amiodarone (PACERONE) tablet 200 mg Daily   cycloSPORINE (NEORAL) microemulsion solution 200 mg BID   digoxin (LANOXIN) tablet 62.5 mcg Daily   docusate (COLACE) 50 mg/5 mL liquid 100 mg BID   insulin NPH (HumuLIN,NovoLIN) injection 6 Units BID   insulin regular (HumuLIN,NovoLIN) injection 0-12 Units Q6H SCH   insulin regular (HumuLIN,NovoLIN) injection 4 Units Q6H SCH   lidocaine (LIDODERM) 5 % patch 1 patch Daily   melatonin tablet 3 mg Nightly   mycophenolate (CELLCEPT) 200 mg/mL suspension 500 mg BID   pantoprazole (PROTONIX) granules 40 mg Daily   polyethylene glycol (MIRALAX) packet 17 g BID   sildenafil (antihypertensive) (REVATIO) 10 mg/mL oral suspension 30 mg Q8H SCH   sulfamethoxazole-trimethoprim (BACTRIM,SEPTRA) 200-40 mg/5 mL suspension 10 mL Once per day on Mon Wed Fri   thiamine (B-1) tablet 100 mg Daily   valGANciclovir (VALCYTE) 50 mg/mL oral solution 450 mg Daily   warfarin (COUMADIN) tablet 3 mg Daily     PRN   acetaminophen 650 mg Q4H PRN   albuterol 2.5 mg Q6H PRN   dextrose 50 % in water (D50W) 12.5 g Q10 Min PRN   labetalol 20 mg Q6H PRN   ocular lubricant 1 drop TID PRN   saliva stimulant comb. no.3 1 spray Q2H PRN   traMADol 50 mg Q6H PRN     Continuous Infusions      Review of Systems:    Full 10 systems reviewed and negative, other than as noted in the HPI.    OBJECTIVE:     Vitals:  Temp:  [35.4 ??C (95.7 ??F)-36.7 ??C (98.1 ??F)] 36.5 ??C (97.7 ??F)  Heart Rate:  [73-88] 80  Resp:  [17-18] 18  BP: (124-142)/(58-66) 142/66  MAP (mmHg):  [84-95] 95  SpO2:  [92 %-100 %] 97 %    Physical Exam:  GEN: NAD, laying in bed.    EYES: sclera anicteric, conjunctiva clear   HENT: NCAT, dry mucus membranes.    NECK: supple, trach site closing, covered in gauze loosely.    RESP: Appears comfortable on 1.5L O2 via [place]  CV: RRR,   GI: abd soft, g-tube present c/d/i  GU: no foley  SKIN: no visible masses, rashes  MSK: FROM in BUE with no notable contractures  Right ankle fused with minimal flexion/extension or eversion/inversion. Bilateral first MTP tenderness. No associated erythema or swelling.  His right great toe goes into flexion due to spasm at MTP.   NEURO:   Mental Status: A&Ox3, attention intact, naming intact, speech fluid but dysphonic which can limit intelligibility at times.  He follows commands well and answers questions appropriately   Cranial Nerve: PERRLA, EOMI with mild horizontal nystagmus right greater than left, facial sensation intact V1-V3 b/l, full facial mvmts and symmetric smile, shrug full and equal, tongue ml but dry   Sensory: BUE sensation intact to light touch.  Decreased sensation to light touch at the entire Left great toe and second toe from the MTPs distally. Also notes decreased section at the left dorsal foot.   Motor:    - RUE: grip 5/5, interosseous 5/5, we 5/5, ef 5/5, ee 5/5, shoulder abd 5/5   - LUE: grip 5/5, interosseous 5/5, we 5/5, ef 5/5, ee 5/5, shoulder abd 5/5   - RLE: 4+/5 hip flexion with knee straight, 5/5 knee extension/flexion, 4-/5                   dorsiflexion/plantarflexion (in his limited range), 0/5 great toe extension.    - LLE: 4-/5 hip flexion with knee straight, 4+/5 with knee flexed.  4+/5 knee                 flexion/extension, 3/5 dorsiflexion, plantarflexion and 2/5 EHL.   Tone: Appears to have some spasticity/tone at the bilateral ankles and right great toe.    Reflexes: Decreased at bilateral patella.  Possible clonus on the right but more noticeable on the left.   Cerebellar: no abnml or extraneous mvmts.   Gait: not tested.   PSYCH: mood and affect appropriate, thought process logical     Labs and Diagnostic Studies: Reviewed    CBC -   Recent Labs  Lab Units 11/24/16  0512 11/23/16  0459 11/22/16  0550   WBC 10*9/L 3.0* 2.5* 3.4*   RBC 10*12/L 2.74* 2.84* 2.76*   HEMOGLOBIN g/dL 8.7* 8.5* 8.5*   HEMATOCRIT % 25.4* 26.5* 25.3*   MCV fL 92.6 93.5 91.5   MCH pg 31.8 30.1 30.9   MCHC g/dL 16.1 09.6 04.5   RDW % 22.5* 21.6* 22.8*   PLATELET COUNT (1) 10*9/L 95* 118* 101*   MPV fL 8.5 8.1 9.0     CMP -   Recent Labs  Lab Units 11/24/16  0512 11/23/16  0459 11/22/16  0550   SODIUM mmol/L 141 141 139   POTASSIUM mmol/L 3.4* 3.7 3.7   CHLORIDE mmol/L 100 102 104   CO2 mmol/L 34.0* 32.0* 30.0   BUN mg/dL 24* 23* 20   CREATININE mg/dL 4.09 8.11 9.14   GLUCOSE mg/dL 782 956 213   CALCIUM mg/dL 8.9 9.0 9.0   MAGNESIUM mg/dL 2.0 1.4* 1.6   PHOSPHORUS mg/dL 3.2 3.4 3.7   ALBUMIN g/dL 2.7* 2.8* 2.7*   PROTEIN TOTAL g/dL 4.8* 5.0* 4.8*   BILIRUBIN TOTAL mg/dL 2.0* 1.0 1.1   ALK PHOS U/L 295* 160* 164*   ALT U/L 64 37 43   AST U/L 104* 22 19     COAGS -   Recent Labs  Lab Units 11/24/16  0512 11/23/16  0459 11/22/16  0550  11/20/16  0512 11/19/16  1700 11/19/16  0845   INR  1.69 1.74 2.32  < > 1.96  --   --    PT sec 19.3* 19.8* 26.5*  < > 22.3*  --   --    APTT sec  --   --   --   --  136.7* 145.0* 186.2*   < > = values in this interval not displayed.    Radiology Results: Reviewed.  Reviewed more recent CT abd/pelvis from 8/23 that demonstrated Unchanged posterior L5-S1 fusion hardware with associated unchanged grade 2 anterolisthesis. Unchanged T10 L2 posterior spinal hardware. Unchanged mild T12 vertebral body compression deformity.      Arneta Cliche, DO  [facility] Physical Medicine & Rehabilitation

## 2016-11-24 NOTE — Unmapped (Signed)
Adult Nutrition Follow Up    Pt is a 66 yo male s/p liver transplant 7/5    ASSESSMENT  Events since Last RD Visit: Pt back on 5WST after brief stay in SICU. TFs of Nepro held since this morning 2/2 episode of emesis. Per RN, vomited 200 mL of undigested clumpy TFs at 0540. Previously tolerating at goal rate 60 mL/hr via PEG with minimal residuals. Reviewed updated nutrition POC with pt, his wife and sister this afternoon.     Labs: Reviewed; K+ 3.4, glucose POC 103-199 x 24 hrs  Meds: Reviewed; insulin regimen, mag sulfate, miralax, KLOR-CON, thiamine  Abd/GI: last BM 9/13 x 1  Skin: see below  Patient Lines/Drains/Airways Status    Active Wounds     None                   Nutrition Orders            Start     Ordered    11/24/16 0639  NPO Sips and chips; Medically necessary; Stop Tube Feeding  Effective now     Comments:  OK FOR ICE CHIPS FOR COMFORT AFTER ORAL CARE IS PERFORMED   Question Answer Comment   NPO Except: Sips and chips    Reason: Medically necessary    If pt on tube feeding (i.e., Adult Enteral, Pediatric Enteral, Infant Formula & Human Milk) Stop Tube Feeding        11/24/16 0639    11/21/16 1053  Adult Enteral Nutrition Nepro (Reduced Lytes/Fluid)  (Adult Enteral Nutrition + Nutrition Consult Panel)  Effective now     Question Answer Comment   Feeding Route: G Tube    Enteral Nutrition Continuous initial rate (mL/hr): 10    Enteral Nutrition Increasing goal rate 10q4    Final Goal (mL/hr) 60    Feeding Tube Flush (mL) 50 mL    Feeding Tube Flush Frequency: Every 4 hours    Enteral Nutrition Formula: Nepro (Reduced Lytes/Fluid)        11/21/16 1054        Anthropometric Data:  -- Height: 157 cm (5' 1.81)   -- Last recorded weight: 67.8 kg (149 lb 7.6 oz)  -- Admission weight: 76.7 kg  -- UBW: 155 lbs (70.4 kg)  -- IBW: 53.1 kg  -- Percent IBW: ~105%  -- AdjIBW: N/A  -- BMI: Body mass index is 27.51 kg/m??.   -- Weight changes this admission: -0.88L since 11/10/16 - down from UBW ~155 lbs   Last 5 Recorded Weights    11/13/16 0926 11/15/16 0812 11/16/16 1117 11/17/16 0826   Weight: 71.5 kg (157 lb 10.1 oz) 73.6 kg (162 lb 4.8 oz) 70.1 kg (154 lb 9.6 oz) 70.1 kg (154 lb 8.7 oz)    11/22/16 0941   Weight: 67.8 kg (149 lb 7.6 oz)      -- Weight history PTA: per EPIC wt hx, pt has experienced ~8.4% wt loss x ~6 months. Wife reports loss some was intentional after changing dietary behaviors.  Wt Readings from Last 10 Encounters:   11/22/16 67.8 kg (149 lb 7.6 oz)   10/08/16 73.5 kg (162 lb 0.6 oz)   09/06/16 75.2 kg (165 lb 12.6 oz)   08/02/16 71.1 kg (156 lb 12.8 oz)   07/27/16 70.2 kg (154 lb 11.2 oz)   07/05/16 71.7 kg (158 lb)   05/31/16 76 kg (167 lb 8 oz)   05/26/16 76.2 kg (167 lb 15.9 oz)   05/26/16  75.3 kg (166 lb)   04/08/16 77.8 kg (171 lb 8 oz)     Nutrition Focused Physical Exam:  Fat Areas Examined  Orbital: Mild/moderate loss  Upper Arm: Mild/moderate loss      Muscle Areas Examined  Temple: Severe loss  Clavicle: Severe loss  Acromion: Severe loss  Scapular: Severe loss  Anterior Thigh: Mild/moderate loss  Posterior Calf: Mild/moderate loss              Nutrition Evaluation  Overall Impressions: Moderate fat loss;Severe muscle loss (10/25/16 1503)    Estimated Nutrition Needs:   Estimated needs adjusted based on most recent weight  1695-2034 kcal/day (25-30 kcal/kg actual BW)  82-103 g protein/day (1.2-1.5 g/kg actual BW)  <45% total calories from CHO  Fluids per MD    Nutrition Assessment: pt continues to benefit from TFs to meet ~100% of needs. Agree with continuous feeds in order to assess tolerance via PEG. Given low K+, suggest modifying formula to Jevity 1.5. Pt previously tolerated this formula and was only changed to Nepro 2/2 AKI, hyperkalemia.     Patient would benefit from nutrition education on food safety post-transplant.      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 (10/25/16 1508)        Fat Loss: Moderate  Muscle Loss: Severe             Nutrition Diagnosis:   Inadequate oral intake related to peri-op period as evidenced by NPO - ongoing  Food and nutrition-related knowledge deficit as related to new liver transplant as evidenced by need for nutrition education prior to discharge - ongoing    Nutrition Goals: diet advancement - not met, basic understanding of nutrition education topic principle of food safety post liver transplant - not met, tube feeds at goal w/in 1 week of ICU stay - met, weight maintenance - ongoing    Nutrition Action/Recommendations/Interventions:   1. Monitor NPO status, nutrition therapy advancement and tolerance  2. Modify EN regimen to Standard 1.5 w/ fiber (Jevity 1.5) @ 60 mL/hr if continuous feeds are desired  --- at goal, order will provide 1260 mL, 1890 kcal, 80 g PRO, 271 g CHO, and 957 mL free water    Recommended bolus regimen:  --- start with 120 mL, run over pump x 1 hr   --- increase bolus by 30 mL q4 hrs   --- final goal: 1 can (237 mL) 5x daily (6a, 10a, 2p, 6p, 10p)   At goal, order will provide 1185 mL, 1777 kcal, 76 g PRO,  255 g CHO, 900 mL free water    Of note, new formula (Jevity 1.5) is 53.6% carbohydrate compared to Nepro, which is 34% carbohydrate. Updated EN regimen will likely require insulin adjustment.    3. Will educate pt and caregiver on food safety prior to discharge home  4. Request twice weekly weights  5. Suggest MVI w/ minerals until tolerating TFs at goal    Follow-up: 1-2 times per week (and more frequent as indicated)     Jackqulyn Livings MPH, RD, LDN  Pager: [phone]

## 2016-11-25 LAB — COMPREHENSIVE METABOLIC PANEL
ALBUMIN: 3 g/dL — ABNORMAL LOW (ref 3.5–5.0)
ALKALINE PHOSPHATASE: 455 U/L — ABNORMAL HIGH (ref 38–126)
ALT (SGPT): 297 U/L — ABNORMAL HIGH (ref 19–72)
AST (SGOT): 318 U/L — ABNORMAL HIGH (ref 19–55)
BILIRUBIN TOTAL: 3.7 mg/dL — ABNORMAL HIGH (ref 0.0–1.2)
BLOOD UREA NITROGEN: 18 mg/dL (ref 7–21)
BUN / CREAT RATIO: 19
CALCIUM: 9.1 mg/dL (ref 8.5–10.2)
CHLORIDE: 101 mmol/L (ref 98–107)
CO2: 34 mmol/L — ABNORMAL HIGH (ref 22.0–30.0)
EGFR MDRD AF AMER: >=60 mL/min/{1.73_m2} (ref >=60)
EGFR MDRD NON AF AMER: >=60 mL/min/{1.73_m2} (ref >=60)
GLUCOSE RANDOM: 156 mg/dL (ref 65–179)
POTASSIUM: 4.3 mmol/L (ref 3.5–5.0)
PROTEIN TOTAL: 5.1 g/dL — ABNORMAL LOW (ref 6.5–8.3)
SODIUM: 138 mmol/L (ref 135–145)

## 2016-11-25 LAB — AST (SGOT): Aspartate aminotransferase:CCnc:Pt:Ser/Plas:Qn:: 318 — ABNORMAL HIGH

## 2016-11-25 LAB — INR: Lab: 2.06

## 2016-11-25 LAB — CYCLOSPORINE (FPIA) BLOOD: Lab: 123

## 2016-11-25 MED ORDER — SULFAMETHOXAZOLE 200 MG-TRIMETHOPRIM 40 MG/5 ML ORAL SUSPENSION
GASTROSTOMY | 0 refills | 0 days
Start: 2016-11-25 — End: 2016-12-10

## 2016-11-25 MED ORDER — AMIODARONE 200 MG TABLET
ORAL_TABLET | Freq: Every day | GASTROSTOMY | 11 refills | 0.00000 days
Start: 2016-11-25 — End: 2016-12-10

## 2016-11-25 MED ORDER — DIGOXIN 62.5 MCG (0.0625 MG) TABLET
Freq: Every day | GASTROSTOMY | 0 refills | 0 days
Start: 2016-11-25 — End: 2016-12-10

## 2016-11-25 MED ORDER — THIAMINE HCL (VITAMIN B1) 100 MG TABLET
ORAL_TABLET | Freq: Every day | GASTROSTOMY | 11 refills | 0 days | Status: SS
Start: 2016-11-25 — End: 2016-12-08

## 2016-11-25 MED ORDER — VALGANCICLOVIR 50 MG/ML ORAL SOLUTION
Freq: Every day | GASTROSTOMY | 0 refills | 0 days | Status: SS
Start: 2016-11-25 — End: 2016-12-08

## 2016-11-25 MED ORDER — PANTOPRAZOLE DR 40 MG GRANULES DELAYED-RELEASE FOR SUSP IN PACKET
PACK | Freq: Every day | GASTROSTOMY | 0 refills | 0 days
Start: 2016-11-25 — End: 2016-12-10

## 2016-11-25 NOTE — Unmapped (Signed)
Discharge Summary    Admit date: 09/14/2016    Discharge date and time: 11/24/2016    Discharge to:  Rehab facility    Discharge Service: Surg Transplant [facility])    Discharge Attending Physician: No att. 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, persistent      OR Procedures:    Liver Allotransplantation; Orthotopic, Partial Or Whole, From Cadaver Or Living Donor, Any Age  Backbnch Std Prep Cad Donor Whole Liver Gft Prior Tnsplnt,Inc Chole,Diss/Rem Surr Tissu Wo Triseg/Lobe Splt  Date  09/15/2016  -------------------    TRACHEOSTOMY PLANNED (SEPART PROC)  Date  09/29/2016  -------------------    Tempoarary Pacemaker Insertion  Date  10/02/2016  -------------------    Pacemaker Implant/Replace Leadless  Date  10/11/2016  -------------------    UGI ENDO; W/DIRECTED PLCMT PERQ GASTROSTOMY TUBE  Date  11/17/2016  -------------------    [facility]I ENDOSCOPY; WITH CONTROL OF BLEEDING, ANY METHOD  Date  11/20/2016  -------------------       Discharge Day Services: the patient was seen and evaluated by transplant surgery and deemed suitable for discharge.     Subjective   No acute events overnight. Pain Controlled.    Objective   No data found.    I/O this shift:  In: 1940 [P.O.:30; NG/GT:1810; IV Piggyback:100]  Out: 1725 [Urine:1475; Emesis/NG output:250]    General Appearance:   No acute distress  Lungs:                Normal work of breathing on room air, tracheostomy incision bandaged   Heart:                           Regular rate and rhythm  Abdomen:                Soft, non-tender, non-distended, incision healing well  Extremities:              Warm and well perfused      Hospital Course: Jeffrey Ward??is a 66 y.o.??male??with a history of IDDM, GERD, prior perioperative a-fib with RVR and 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 Post-op course complicated by decompensation secondary to acute right heart failure, AKI requiring CRRT (resolved), severe pulmonary hypertension responsive to sildenafil, bradycardia/ asystole requiring pacemaker placement, atrial fibrilliation/ narrow complex tachycardia requiring anti-coagulation with warfarin and amiodarone/ digoxin, malnutrition/ debility, and altered mental status/ delirium improved with transition to cyclosporine and psychiatric medications. Multiple services assisted in co-management including cardiology, pulmonology, nephrology, neurology, endocrinology, psychiatry, as well as therapy services. He was transferred to the ISCU 7/30. On 11/03/16 he had a significant spike in his LFTs s/p biopsy 8/24 negative for rejection. He continued to recover and was transferred to floor status 8/28. The patient was transferred back to the SICU 9/9 2/2 GI bleed following PEG placement. Once resuscitated and stabilized, he was transferred back to the floor 9/10. He was evaluated by [facility] 9/13 and accepted for admission.     SICU Course:    Liver transplant  Baseline liver US on  7/6 demonstrated patent hepatic transplant vasculature with markedly elevated resistive indices in the hepatic arteries and step-up in main portal vein anastomotic velocities. These may be due to postoperative edema and/or adjacent 3.9 cm porta hepatis fluid collection. Additionally, multiple perihepatic fluid collections that may represent seromas, bilomas, hematomas, abscesses, or combination. LFTs were decreasing postoperatively but during his acute R heart failure decompensation, liver ultrasound and LFTs showed elevated RIs and inflammation likely due to congestion and obstruction of liver outflow into the IVC. This improved with volume removal via CRRT and optimization of his cardiac function. His LFTs eventually normalized for most of his hospital course. Due to altered mental status, he was transitioned to cyclosporine and prograf was discontinued. He had significant improvement in mental status during this time, so cyclosporine was continued as maintenance immunosuppression with cellcept. 8/23 he was noted to have increase to total bilirubin and transaminases with VIR biopsy performed 8/24, which was negative for rejection. LFTs returned to WNL. His immunosuppression was adjusted according to trough during his admission. He was continued on appropriate prophylaxis of bactrim and valcyte. He received appropriate anti-fungal coverage during his admission.    Hypoxic respiratory failure  Patient passed SBT on 7/6 but was unable to follow commands. Pt was extubated on 7/7 to HFNC without ability to wean on 7/8. He continud to have pulmonary congestion with unresolving bilateral pleural effusions. VIR drained R sided effusion on 7/8 with drained. Cultures were negative. He was extubated on 7/15 to a high flow nasal cannula. Unfortunately, he had an acute decompensation on 7/17 and required reintubation. Due to his failed extubation attempts, family was consented for a tracheostomy, performed in the operating room on 7/19 by the Trauma Surgery service. This procedure was uncomplicated. He began tolerating trach collar trials 7/23 and tolerated 24 hours of trach collar on 7/28. Due to tolerance of trach collar of > 72 hours and clinical stability, he was able to be transferred to the ISCU on 7/30.     Atrial fibrillation with RVR  Patient developed a fib intraoperatively and was on norepinephrine while in OR. When admitted to SICU, he was in normal sinus rhythm. On POD3, he developed a fib with RVR and hypotension refractory to cardioversion x3. He was then managed with amiodarone and digoxin. He converted into NSR on POD5. Cardiology diagnosed him with acute R sided heart failure and recommended diuresis of 1-2L, so he was started on dopamine, phenylephrine, amiodarone, and lasix drips. He returned to normal sinus rhythm on 7/11. Cardiology recommended continuing amiodarone for a total of 5g load and then we converted him to 200mg  daily on 7/16. Digoxin was stopped 7/16 due to bradycardia episodes (see below). Amiodarone was stopped 7/19 after loading dose completed. He remained in normal sinus rhythm for a few weeks until returning to atrial fibrillation vs atrial flutter arrhythmia. Cardiology was re-engaged in consult 8/2. He was again loaded with amiodarone and recommended to begin anti-coagulation. He was started on a heparin drip 8/2. Heparin drip was given 8/4-8/8 until INR was therapeutic (he required heparin drip at other points later in admission due to prolonged sub-therapeutic INR with adjustment of warfarin). He again had narrow complex tachycardia with cardiology first recommeding continued amiodarone and diuresis, with due to recurrence of arrhythmia, then recommending addition of digoxin, which was started 8/17 and continued throughout admission. He remained in NSR for the remainder of his hospital stay.    Acute R heart failure  Cardiology initially diagnosed him with acute  R sided heart failure when he went into a fib with RVR and recommended diuresis of 1-2L, so he was started on dopamine, phenylephrine, amiodarone, and lasix drips. Managed with low preload - diuresis of 1-2L a day with lasix drip. Differential included volume overload, acute MI, and PE. Troponins trended and ruled out MI, bilateral PVLs negative so PE unlikely. Volume overload initially managed with lasix drips, but patient had decreasing urine output and was started on CRRT for volume overload purposes. Theone Murdoch catheter placed on 7/10 for closer monitoring of pressures, showing that dopamine and vasopressin seemed to be most effective for his clinical picture. CRRT was stopped on 7/15 and he was kept net even with his fluid status, with no pressor requirement after extubation on 7/15. Repeat echo on 7/17 showed persistently elevated pulmonary artery pressure with dilated R heart and reduced R ventricular systolic function. Due to bradycardia and prolonged asystole (presumed vasovagal response), cardiology placed a temporary pacemaker 7/22. Permanent pacemaker was then placed 7/31.    Pulmonary hypertension  To help manage his pressors and volume status around the time of his decompensation into R heart failure, Swan-ganz catheter was placed and pulmonary hypertension was confirmed. Our pulmonary hypertension team was consulted and suspected it could be due to volume overload, mechanical venilation, or pre-existing hepatopulmonary syndrome now s/p transplant with loss of vasodilatory effect. They agreed with our plan of decreasing his volume overload and we diuresed him via CRRT at a goal of 200cc/hr. 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 It is possible that he had preexisting portopulmonary hypertension that was masked by a condition such as hepatopulmonary syndrome (will note a bubble study was positive in 11/2015). This phenomenon has been described in case reports in the literature. He was managed with sildenafil 30mg  TID. When he had his acute decompensation on 7/16 he was hypotensive, so we switched him to inhaled epoprostenol to limit systemic vasodilatory effects. Once he was weaned off of his pressor requirement, he was started on sildenafil again. Pulmonology provided ongoing consultation with eventual stabilization on sildenafil 30mg  q8h.    Neurology  On morning of 7/10, was being weaned from versed drip in order to obtain accurate mental status exam and had witnessed seizure x2 responsive to Ativan. Stat CT head and EEG revealed absence of seizure activity. He did have resting tremor noted, most likely due to tacrolimus side effect. Due to ongoing somnolence and altered mental status he was transitioned off Prograf and started on Cyclosporine, with eventual improvement in mental clarity and ability to participate in therapy/ follow commands. He continued to improve with ongoing consult from psychiatry. At the time of discharge he was mentally clear without ongoing delirium.     GI  The patient was noted to have impaired PO intake 2/2 aspiration with swallowing. He was maintained on tube feeds during his admission via corpak, which was replaced multiple times after the patient removed it. He went for PEG placement 9/6 c/b oozing at PEG placement site which required suture. TF were restarted with tolerance 9/7; however, patient then had coffee ground emesis and drop in hgb presumed 2/2 GI bleed, requiring a total of 3 units pRBC for resuscitation. EGD performed 9/9 demonstrated new bleeding ulcer at the GE junction, which was treated with clip placement and epinephrine. He recovered well with stable hgb, with TF resumed 9/10 and advanced. He  had 1 episode of emesis 9/12, but did well following adjustment of TF.     AKI  The patient was noted to have AKI soon after transplant in the setting of cardiogenic shock and right heart failure with pulmonary hypertension. He was first started on a Lasix drip 7/8 and was eventually started on CRRT 7/10 for volume removal, which was discontinued 7/15. He had total recovery of his renal function with good UOP for several weeks. Nephrology was again consulted for new AKI 8/17, which was though 2/2 pre-renal injury 2/2 intravascular volume depletion. He was challenged with IVF, which resulted in improvement in creatinine. He continued to have good UOP with return of creatinine to baseline at the time of discharge.     Endocrinology  Patient was followed closely by endocrinology during admission due to ongoing insulin requirements and adjustment 2/2 changed TF regimen and overall clinical status. He was discharged on insulin to [facility].       Condition at Discharge: Improved  Discharge Medications:      Medication List      START taking these medications    acetaminophen 650 mg/20.3 mL Soln  Commonly known as:  TYLENOL  20.3 mL (650 mg total) by PEG Tube route every four (4) hours as needed.     albuterol 2.5 mg/0.5 mL nebulizer solution  Inhale 0.5 mL (2.5 mg total) by nebulization every six (6) hours as needed   for wheezing.     amiodarone 200 MG tablet  Commonly known as:  PACERONE  1 tablet (200 mg total) by G-tube route daily.     cycloSPORINE 100 mg/mL microemulsion solution  Commonly known as:  NEORAL  2 mL (200 mg total) by G-tube route Two (2) times a day.     digoxin 62.5 mcg Tab  65 mcg by G-tube route daily.     docusate 50 mg/5 mL liquid  Commonly known as:  COLACE  10 mL (100 mg total) by G-tube route Two (2) times a day.     insulin NPH 100 unit/mL injection  Commonly known as:  HumuLIN,NovoLIN  Inject 0.06 mL (6 Units total) under the skin Two (2) times a day (at 6am   and 6pm).     * insulin regular 100 unit/mL injection  Commonly known as:  HumuLIN,NovoLIN  Inject 0-0.12 mL (0-12 Units total) under the skin every six (6) hours.     * insulin regular 100 unit/mL injection  Commonly known as:  HumuLIN,NovoLIN  Inject 0.04 mL (4 Units total) under the skin every six (6) hours.     melatonin 3 mg Tab  1 tablet (3 mg total) by PEG Tube route nightly.     mycophenolate 200 mg/mL suspension  Commonly known as:  CELLCEPT  2.5 mL (500 mg total) by G-tube route Two (2) times a day (8am and 8pm).     pantoprazole 40 mg Grps  Commonly known as:  PROTONIX  1 packet (40 mg total) by G-tube route daily.     polyethylene glycol 17 gram packet  Commonly known as:  MIRALAX  17 g by G-tube route two (2) times a day as needed.     sildenafil (antihypertensive) 10 mg/mL Susr oral suspension  Commonly known as:  REVATIO  3 mL (30 mg total) by PEG Tube route every eight (8) hours.     sulfamethoxazole-trimethoprim 200-40 mg/5 mL suspension  Commonly known as:  BACTRIM,SEPTRA  10 mL by G-tube route 3 (three) times a  week.     thiamine 100 MG tablet  Commonly known as:  B-1  1 tablet (100 mg total) by G-tube route daily.     traMADol 50 mg tablet  Commonly known as:  ULTRAM  1 tablet (50 mg total) by G-tube route every six (6) hours as needed. for   up to 5 days     valGANciclovir 50 mg/mL Solr  Commonly known as:  VALCYTE  9 mL (450 mg total) by PEG Tube route daily.     warfarin 3 MG tablet  Commonly known as:  COUMADIN  1 tablet (3 mg total) by G-tube route daily with evening meal.        * This list has 2 medication(s) that are the same as other medications   prescribed for you. Read the directions carefully, and ask your doctor or   other care provider to review them with you.            STOP taking these medications    CENTRUM SILVER ORAL     furosemide 20 MG tablet  Commonly known as:  LASIX     insulin degludec 200 unit/mL (3 mL) Inpn     lancing device Misc     levoFLOXacin 500 MG tablet  Commonly known as:  LEVAQUIN     pen needle, diabetic 31 gauge x 5/16 Ndle     rifAXIMin 550 mg Tab  Commonly known as:  XIFAXAN     spironolactone 50 MG tablet  Commonly known as:  ALDACTONE     sucralfate 1 gram tablet  Commonly known as:  CARAFATE     VITAMIN D3 2,000 unit Cap  Generic drug:  cholecalciferol (vitamin D3)          Pending Test Results:     Discharge Instructions:  Activity:     Diet:    Other Instructions:    Labs and Other Follow-ups after Discharge:  Follow Up instructions and Outpatient Referrals     Call MD for:  redness, tenderness, or signs of infection (pain, swelling, redness, odor or green/yellow discharge around incision site)       Discharge instructions       [facility]  DEPARTMENT OF CARDIAC ELECTROPHYSIOLOGY     PACEMAKER INFORMATION  New Implant    Groin care: You can take off the bandage over your groin and shower after 24 hours. Wash the area gently with soap and water. You may have some soreness in your groin area, which should go away in a few days. You can put ice packs on the site or take over-the-counter Tylenol to help with the soreness.                    Signs/symptoms to report: Please call the EP Main Office [phone] during normal business hours (Monday- Friday, 7:30 am - 5:00 pm) if you have:    Redness, warmth, swelling, or oozing at the groin site  Fever over 101 degrees F    If you start bleeding from the groin, lay flat and hold direct, firm pressure over the site for 15 minutes. If you are still bleeding after this or have uncontrolled bleeding, go to the nearest Emergency Room or call 911.    Activity: Do not lift more than 10 pounds for 3-5 days or until the scab falls off and the skin has healed over the groin puncture site. After 3-5 days, you can go back to  normal activity.    Follow-up Appointment-     You will be seen in 7 - 14 days for pacemaker check. This will be done at the [facility] at [facility].     This appointment will be mailed to you. IF YOU DO NOT GET AN APPOINTMENT WITHIN 7 DAYS AFTER LEAVING THE HOSPITAL, CALL [phone].      Driving - Do not drive for 24 hours.     Things to Avoid - Avoid ham radios, arc welders, battery-powered tools, and strong magnetic fields.  Avoid medical devices like electric muscle stimulators or TENS units. Avoid standing over a running car motor with the hood up. When using a cell phone, use the ear on the other side of your pacemaker.    Other Things to Know -  Let ALL medical providers know that you have a pacemaker.  Your pacemaker may set off a metal detector, like those used in airports and courtrooms. Let security know you have a pacemaker and show them your ID card.  It is okay to go through airport body scanners. They will not harm your pacemaker.    ID Card - You will be given a temporary ID card before you leave the hospital.  A permanent ID card will be mailed to you within 8 weeks. CARRY YOUR ID CARD AT ALL TIMES.  If you do not receive your ID card, please call your device company to request.    Follow-up care -  You will need to be seen at [facility] at [facility] for follow-up. You will get these appointments in the mail:  Incision check in 7-14 days  Clinic appointment in 3 months with your doctor   Yearly clinic visits with the [facility] Ep Device Clinic    Remote Follow-Up:   A remote monitor was prescribed for you by your physician.  The monitor is designed to send data using a cellular signal within the monitor or a standard phone line to a secured server that is accessed only by our Ep Remote Monitoring Center.  All data is reviewed by the monitoring team.  If you have a wireless pacemaker, you may leave your bedside monitor (box) connected at all times within 6-10 feet of where you sleep. [employer] PACEMAKERAS ARE NOT WIRELESS. This lets your monitor check-in with your Pacemaker every night and lets [place] know of any problems.  If we see something abnormal, you will be contacted by the Remote Monitoring team.  In addition to your nightly check-in you will have 4 full report remote checks per year in addition to a yearly in person check.   All appointments from home will be scheduled as a [place] EP REMOTE MONITORING Appointment, you DO NOT COME TO THE HOSPITAL.  This appointment will be completed by your home monitor between the hours of midnight and 6am on the scheduled date.  Please ignore the time that is listed with your appointment.  This transmission will occur automatically with your home monitor so there is nothing that you need to push to make this happen.  If we do not receive your data, we will contact you.    REMINDER:  PLEASE DON'T TRANSMIT UNLESS YOU HAVE TALKED WITH SOMEONE FROM THE EP REMOTE MONITORING TEAM AND HAVE RECEIVED INSTRUCTIONS TO SEND A UNSCHEDULED TRANSMISSION.  UNAUTHORIZED TRANSMISSIONS MAY RESULT IN DELAY OF TREATMENT OR UNVIEWED DATA.    [employer] Pacemakers  You will have four pacemaker checks per year.  Three of these  appointments will be scheduled from home and one in office.  You will receive an appointment reminder with the [facility] EP REMOTE MONITORING center when it is time for your AT HOME PACEMAKER CHECK.  Please disregard the time that is listed with the appointment and send anytime of the day on your scheduled date.  Remember this is an AT HOME PACEMAKER CHECK AND YOU WILL NOT COME TO THE HOSPITAL.     You will receive an remote monitor appointment reminder approximately 21 days beforehand.  We only schedule one appointment at a time.  When we receive your data, we will schedule your next appointment.    Insurance and Co-Pays  The device data we review from your monitor is comparable to an in-office appointment.  Therefore, your insurance company will be billed for review of your data.  If you are required from your insurance company to give [place] a referral for your office visit, you would also be responsible for acquiring the referral and sending it to [place] prior to your transmission.  Without a referral you could be responsible for payment.    Traveling  If you are going out of town, you can take your monitor with you within the [place]     Travel for less than a 2 week period does not require you to take your monitor with you, but remember you will not be monitored during that period.     Questions:   Our staff is here Monday through Friday, from 7:30 a.m. - 5:00 p.m. If you leave a message, you should get a call back within 24 hours.     IMPORTANT PHONE NUMBERS:    EP Main Office (Monday-Friday, 7:30 a.m. - 5:00 p.m.):    Office - [phone]                       Fax  - [phone]    EP Device Clinic: - [phone]     Appointments for Incision Check and first 3 month clinic visit: [phone] Cardiology Fellow On-Call: (For after-hours or weekend emergencies)  Call hospital operator [phone] and ask for Cardiology Fellow On-Call    All questions regarding your device, home monitor, ID CARDS, or to update your information should be directed to your device company listed below.    [employer] [phone]  [employer] [phone]  [employer]  [phone]. [employer] [place]) [phone]               Future Appointments:  Appointments which have been scheduled for you    Dec 16, 2016 11:30 AM EDT  NEW PFT 30 with [place] PFT 2  [facility] PULMONARY SPECIALTY FUNCT [place] [facility] REGION) [address]  [address]  [place] [address]  [phone]   Dec 16, 2016 12:00 PM EDT  NEW  PULM HYPERTENSION with Jettie Booze, MD  [facility] PULMONARY SPECIALTY CL [place] [facility] REGION) [address]  [address]  [place] [address]  [phone]   Jan 23, 2017 12:00 AM EST  AT HOME PACEMAKER CHECK with [facility] EP REMOTE MONITORING  [facility] EP REMOTE MONITORING [place] [facility] REGION) [address]  [address] [place] [address]  [phone]   Apr 24, 2017 12:00 AM EST  AT HOME PACEMAKER CHECK with [facility] EP REMOTE MONITORING  [facility] EP REMOTE MONITORING  (  [facility] REGION) [address]  [address] [place] [address]  [phone]   Jul 24, 2017 12:00 AM EDT  AT HOME PACEMAKER CHECK with [facility] EP REMOTE MONITORING  [facility] EP REMOTE MONITORING [place] [facility] REGION) [address]  [address] [place] [address]  [phone]

## 2016-11-25 NOTE — Unmapped (Signed)
Problem: Patient Care Overview  Goal: Plan of Care Review  Outcome: Progressing   11/25/16 1138   OTHER   Plan of Care Reviewed With patient   Plan of Care Review   Progress improving      11/25/16 1138   OTHER   Plan of Care Reviewed With patient;spouse   Plan of Care Review   Progress improving       Problem: Self-Care Deficit (Adult,Obstetrics,Pediatric)  Goal: Identify Related Risk Factors and Signs and Symptoms  Related risk factors and signs and symptoms are identified upon initiation of Human Response Clinical Practice Guideline (CPG).   Outcome: Progressing   11/25/16 1138   Self-Care Deficit (Adult,Obstetrics,Pediatric)   Related Risk Factors (Self-Care Deficit) activity intolerance;disease process;medication effects;surgery;pain/discomfort   Signs and Symptoms (Self-Care Deficit) decreased balance;gait unstable;weakness, paresis, paralysis       Problem: Fall Risk (Adult)  Goal: Identify Related Risk Factors and Signs and Symptoms  Related risk factors and signs and symptoms are identified upon initiation of Human Response Clinical Practice Guideline (CPG).   Outcome: Progressing   11/25/16 1138   Fall Risk (Adult)   Related Risk Factors (Fall Risk) fatigue/slow reaction;gait/mobility problems;polypharmacy;objects hard to reach   Signs and Symptoms (Fall Risk) presence of risk factors       Problem: Activity Intolerance (Adult)  Goal: Identify Related Risk Factors and Signs and Symptoms  Related risk factors and signs and symptoms are identified upon initiation of Human Response Clinical Practice Guideline (CPG).   Outcome: Progressing   11/25/16 1138   Activity Intolerance (Adult)   Related Risk Factors (Activity Intolerance) generalized weakness;pain;medication side effects   Signs and Symptoms (Activity Intolerance) pain/discomfort;nausea       Problem: Nutrition, Imbalanced: Inadequate Oral Intake (Adult)  Goal: Identify Related Risk Factors and Signs and Symptoms  Related risk factors and signs and symptoms are identified upon initiation of Human Response Clinical Practice Guideline (CPG).   Outcome: Progressing   11/25/16 1138   Nutrition, Imbalanced: Inadequate Oral Intake (Adult)   Related Risk Factors (Nutrition Imbalance, Inadequate Oral Intake) appetite decreased;knowledge deficit;NPO status prolonged   Signs and Symptoms (Nutrition Imbalance, Inadequate Oral Intake: Signs and Symptoms) nausea and vomiting;weakness/lethargy       Problem: Infection, Risk/Actual (Adult)  Goal: Identify Related Risk Factors and Signs and Symptoms  Related risk factors and signs and symptoms are identified upon initiation of Human Response Clinical Practice Guideline (CPG).   Outcome: Progressing   11/25/16 1138   Infection, Risk/Actual (Adult)   Related Risk Factors (Infection, Risk/Actual) prolonged hospitalization;medication effects;surgery/procedure   Signs and Symptoms (Infection, Risk/Actual) feeding/eating intolerance;vomiting;weakness;pain       Problem: Skin Injury Risk (Adult)  Goal: Identify Related Risk Factors and Signs and Symptoms  Related risk factors and signs and symptoms are identified upon initiation of Human Response Clinical Practice Guideline (CPG).   Outcome: Progressing   11/25/16 1138   Skin Injury Risk (Adult)   Related Risk Factors (Skin Injury Risk) cognitive impairment;hospitalization prolonged;mobility impaired

## 2016-11-25 NOTE — Unmapped (Signed)
Problem: Patient Care Overview  Goal: Plan of Care Review  Pt new adm.  A&O x4.  VSS. Afebrile.  Decannulated on 9/9. Pts airway congested but has strong cough and bring up mod amt white phlegm and self yanker suction.  No acute resp distress Old trach site.pressure dsg on.  PEG tube in place w/ Jevity 1.5cal  X5/day w/ via pump x1hr each feeding. Increase 30ml each time w/ goal of w/ 50ml h2o pre/post flush.  Q6hr Insulin coverage given as ordered. Strict NPO. Pt continent of B&B and uses urinal voiding mod amt orange color urine. LBM 9/13. Pt denies pain. Skin intact.

## 2016-11-25 NOTE — Unmapped (Signed)
PHYSICAL THERAPY  Evaluation (11/25/16 1000)     Patient Name:  Jeffrey Ward       Medical Record Number: [id number]   Date of Birth: [birth year]  Sex: Male            Treatment Diagnosis: Decreased endurance and global weakness following liver transplant with prolonged hospital course and complications     ASSESSMENT    Patient is a 66 y.o M who presents to [facility] following liver transplant with prolonged hospitalization course due to complications with GI bleeding. At this time, he continues to be limited by pain and decreased activity tolerance and SOB at rest. He will benefit from continued skilled PT at this time to help maximize his functional independence and return to prior level of function.     Today's Interventions: PT evaluation, orient to rehab, gait training, education on role of PT and rehab POC     Activity Tolerance: Patient limited by fatigue;Patient tolerated treatment well;Other (SOB and discomfort in back)    PLAN  Planned Frequency of Treatment:  1-2 hours per day, for: 5-7 days per week      Planned Interventions: Balance activities;Education - Patient;Education - Family / caregiver;Endurance activities;Functional mobility;Home exercise program;Neuromuscular re-education;Postural re-education;Self-care / Home training;Therapeutic exercise;Therapeutic activity;Transfer training    Post-Discharge Physical Therapy Recommendations:           PT DME Recommendations:  (TBD)     Goals:   Patient and Family Goals: I want to get home    Long Term Goal #1: In 8 weeks, patient will return to PLOF with least restrictive device        SHORT GOAL #1: Patient will be able to perform all transfers with SBA and least restrictive assistive device               Time Frame : 1 week  SHORT GOAL #2: Patient will ambulate 7' with SBA and least restrictive assistive device               Time Frame : 1 week  SHORT GOAL #3: Patient will be able to negotiate 7 steps with L railing with CGA               Time Frame : 1 week  SHORT GOAL #4: .Marland Kitchen                                     Prognosis:  Good  Barriers to Discharge: Gait instability;Functional strength deficits;Severity of deficits;Endurance deficits  Positive Indicators: PLOF, motivation     SUBJECTIVE  Patient reports: Agreeable to PT   Current Functional Status: As per acute PT minA for bed mobility and transfers using a RW   Services patient receives: OT;SLP;PT  Prior functional status: Patient was independent prior to hospital admission   Equipment available at home: None    Past Medical History:   Diagnosis Date   ??? Cancer (CMS-HCC)    ??? Cirrhosis (CMS-HCC)    ??? Diabetes (CMS-HCC)    ??? Hepatitis C 07/17/2012   ??? Liver disease    ??? Low back pain 07/17/2012   ??? Varices, esophageal (CMS-HCC)     Social History   Substance Use Topics   ??? Smoking status: Never Smoker   ??? Smokeless tobacco: Never Used      Comment: Smoked in high school for about 5 years.    ??? Alcohol use  No      Past Surgical History:   Procedure Laterality Date   ??? ANKLE SURGERY     ??? BACK SURGERY     ??? BACK SURGERY     ??? CHG US GUIDE, TISSUE ABLATION N/A 01/22/2016    Procedure: ULTRASOUND GUIDANCE FOR, AND MONITORING OF, PARENCHYMAL TISSUE ABLATION;  Surgeon: Particia Nearing, MD;  Location: MAIN OR [facility];  Service: Transplant   ??? IR EMBOLIZATION ORGAN ISCHEMIA, TUMORS, INFAR  06/16/2016    IR EMBOLIZATION ORGAN ISCHEMIA, TUMORS, INFAR 06/16/2016 Ammie Dalton, MD IMG VIR H&V [facility]   ??? PR COLON CA SCRN NOT HI RSK IND N/A 02/27/2015    Procedure: COLOREC CNCR SCR;COLNSCPY NO;  Surgeon: Vonda Antigua, MD;  [facility]: [facility];  Service: Gastroenterology   ??? PR ENDOSCOPIC ULTRASOUND EXAM N/A 02/27/2015    Procedure: UGI ENDO; W/ENDO ULTRASOUND EXAM INCLUDES ESOPHAGUS, STOMACH, &/OR DUODENUM/JEJUNUM;  Surgeon: Vonda Antigua, MD;  Location: [facility]URES [facility];  Service: Gastroenterology   ??? PR INSER HEART TEMP PACER ONE CHMBR N/A 10/02/2016    Procedure: Tempoarary Pacemaker Insertion; Surgeon: Meredith Leeds, MD;  Location: [facility] EP;  Service: Cardiology   ??? PR LAP,DIAGNOSTIC ABDOMEN N/A 01/22/2016    Procedure: Laparoscopy, Abdomen, Peritoneum, & Omentum, Diagnostic, W/Wo Collection Specimen(S) By Brushing Or Washing;  Surgeon: Particia Nearing, MD;  Location: MAIN OR [facility];  Service: Transplant   ??? PR PLACE PERCUT GASTROSTOMY TUBE N/A 11/17/2016    Procedure: UGI ENDO; W/DIRECTED PLCMT PERQ GASTROSTOMY TUBE;  Surgeon: Cletis Athens, MD;  Location: [facility]URES [facility];  Service: Gastroenterology   ??? PR TRACHEOSTOMY, PLANNED N/A 09/29/2016    Procedure: TRACHEOSTOMY PLANNED (SEPART PROC);  Surgeon: Katherina Mires, MD;  Location: MAIN OR [facility];  Service: Trauma   ??? PR TRANSCATH INSERT OR REPLACE LEADLESS PM VENTR N/A 10/11/2016    Procedure: Pacemaker Implant/Replace Leadless;  Surgeon: Meredith Leeds, MD;  Location: [facility] EP;  Service: Cardiology   ??? PR TRANSPLANT LIVER,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    Procedure: Liver Allotransplantation; Orthotopic, Partial Or Whole, From Cadaver Or Living Donor, Any Age;  Surgeon: Doyce Loose, MD;  Location: MAIN OR [facility];  Service: Transplant   ??? PR TRANSPLANT,PREP DONOR LIVER, WHOLE N/A 09/15/2016    Procedure: Rogelia Boga Std Prep Cad Donor Whole Liver Gft Prior Tnsplnt,Inc Chole,Diss/Rem Surr Tissu Wo Triseg/Lobe Splt;  Surgeon: Doyce Loose, MD;  Location: MAIN OR [facility];  Service: Transplant   ??? PR UPPER GI ENDOSCOPY,BIOPSY N/A 07/17/2012    Procedure: UGI ENDOSCOPY; WITH BIOPSY, SINGLE OR MULTIPLE;  Surgeon: Alba Destine, MD;  Location: [facility];  Service: Gastroenterology   ??? PR UPPER GI ENDOSCOPY,DIAGNOSIS N/A 02/04/2014    Procedure: UGI ENDO, INCLUDE ESOPHAGUS, STOMACH, & DUODENUM &/OR JEJUNUM; DX W/WO COLLECTION SPECIMN, BY BRUSH OR WASH;  Surgeon: Wilburt Finlay, MD;  Location: [facility]URES [facility];  Service: Gastroenterology   ??? PR UPPER GI ENDOSCOPY,LIGAT VARIX N/A 11/05/2013 Procedure: UGI ENDO; W/BAND LIG ESOPH &/OR GASTRIC VARICES;  Surgeon: Wilburt Finlay, MD;  Location: [facility];  Service: Gastroenterology    Family History   Problem Relation Age of Onset   ??? Hypertension Mother    ??? Cirrhosis Neg Hx    ??? Liver cancer Neg Hx         Allergies: Patient has no known allergies.  Objective Findings              Precautions:  (Protective precautions; mask when out of the room)              Weight Bearing Status: Non- applicable              Required Braces or Orthoses: Non- applicable    Communication Preference: Verbal  Pain Comments: Reports of back pain and chest pain, MD present to assess, ok to continue with therapy   Medical Tests / Procedures: RR 9/9 bleeding from PEG site and hematesis, possible GI bleed, transfer to SICU and intubated, s/p liver transplant  Equipment / Environment: Supplemental oxygen;Gastric tube;Vascular access (PIV, TLC, Port-a-cath, PICC) (2L O2 via [place])    At Rest: SOB at rest, SpO2 90%, MD placed patient on supplemental O2 2L for comfort   With Activity: SpO2 94% on 2L O2 via [place]           Living environment: House  Lives With: Spouse  Home Living: One level home;Grab bars around toilet;Grab bars in shower;Walk-in shower;Garden tub;Built-in shower seat;Standard height toilet;Stairs to enter with rails  Rail placement (outside): Rail on left side     Number of Stairs: 7    Cognition: WFL, following all commands   Visual / Perception Status: Appears normal, denies any changes in vision   Skin Inspection: All visible skin intact    UE ROM: BUE WFL  UE Strength: Grossly 4/5 in BUEs throughout all planes of motion   LE ROM: BLE WFL, R ankle decreased ROM due to previous fusion   LE Strength: BLE grossly 4-/5                        Sensation: Reports numbness to all toes and dorsum of feet as well as 1/3 way up anterior tibias bilaterally. Endorses that numbness has been worsening             Bed Mobility: Supine <> sit with SBA with increased time due to fatigue   Transfers: Sit <> stand with CGA with RW, crouched posture noted through bilat knees    Gait: Amb 80' x 1 with CGA, occassional minA due to unsteadiness. Slow pace noted, patient endorsed worsening back pain with ambulation    Stairs: NT    Wheelchair Mobility: NA   Endurance: Decreased activity tolerance noted today due to pain and fatigue as well as SOB    Eval Duration(PT): 60 Min.          I attest that I have reviewed the above information.  Signed: Gordy Councilman, PT  Filed 11/25/2016

## 2016-11-25 NOTE — Unmapped (Signed)
Initial Consult Note        Requesting Attending Physician:  Merrily Brittle, DO  Service Requesting Consult: Physical Medicine and Rehabilitation [facility])     Assessment and Plan          Jeffrey Ward is a 66 y.o. 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 Fib, bradycardia with pacemaker, DM, and right heart failure on whom I have been asked by Merrily Brittle, DO to consult for lower extremity weakness.    LE Muscle Weakness: patient's exam is notable for left knee flexion weakness, in conjunction with decreased reflexes and muscular atrophy in his LE. Patient's past history is also notable for several back surgeries in the past. Although patient's weakness appears to have worsened following his transplant surgery 2 months ago, patient's focal weakness makes a myopathy or NMJ disorder less likely at this point. His findings would however, support a primarily chronic lumbar radiculopathy as a potential etiology, likely beyond the LLE but with clear clinical worsening. As such, we would recommend that patient undergo a lumbar spine MRI for further evaluation, and likely an EMG for better localization to identify whether a clear lesion exists that could benefit from any intervention.    Recommendations:  [ ]  lumbar spine MRI w/o contrast    This patient was seen and discussed with Dr. Raenette Rover who agrees with the above assessment and plan.    Hazem Etheleen Mayhew, MD  Department of Neurology  PGY-2 Resident        ATTESTATION NOTE:  I saw and evaluated the patient, participating in the key portions of the service.  I reviewed the resident???s note and agree with the resident???s findings and plan as documented in their note.  Jeffrey Cooler, MD        HPI        Reason for Consult: lower extremity weakness    Jeffrey Ward is a 66 y.o. with a history of HCC and chronic HCV s/p liver transplant (09/15/2016), paroxysmal A. Fib, bradycardia with pacemaker, DM, and right heart failure on whom I have been asked by Merrily Brittle, DO to consult for lower extremity weakness.    Patient initially admitted for liver transplant on 09/15/2016, for his cirrhosis secondary to chronic HCV and HCC. Patient initially started on prednisone and tacrolimus, and transitioned to cyclosporine on 8/13. Post-op course was complicated by decompensation secondary to acute heart failure and bradycardia/asystole requiring pacemaker placement. Most recently, patient was found with a GI bleed following PEG placement on 9/6 (secondary to dysphagia), and had an EGD on 9/9 which revealed a GE ulcer which was subsequently clipped.    In terms of his weakness, patient reports having had diffuse weakness since his transplant surgery on 7/5. Patient reports that this weakness includes all his extremities and has gradually worsened over his hospitalization. He endorses difficulty getting up from a sitting position and reaching for objects. Since yesterday, patient has endorsed numbness in his left foot. He denies any recent viral illness, exacerbation with activity or throughout the day, difficulty breathing, urinary incontinence, or saddle anesthesia.     No Known Allergies     Current Facility-Administered Medications   Medication Dose Route Frequency Provider Last Rate Last Dose   ??? acetaminophen (TYLENOL) solution 650 mg  650 mg PEG Tube Q4H PRN Jessup Ogas A Sainburg, DO       ??? albuterol 2.5 mg /3 mL (0.083 %) nebulizer solution 2.5  mg  2.5 mg Nebulization Q6H PRN Delane Ginger, DO   2.5 mg at 11/25/16 1116   ??? amiodarone (PACERONE) tablet 200 mg  200 mg PEG Tube Daily Clarisse Gouge [place]ain[place], DO   200 mg at 11/25/16 0829   ??? cycloSPORINE (NEORAL) microemulsion solution 200 mg  200 mg PEG Tube BID Trellis Paganini [place], [place]       ??? digoxin (LANOXIN) tablet 62.5 mcg  62.5 mcg PEG Tube Daily Clarisse Gouge [place], DO   62.5 mcg at 11/25/16 0839   ??? docusate (COLACE) 50 mg/5 mL liquid 100 mg  100 mg G-tube BID Delane Ginger, DO   100 mg at 11/25/16 1610   ??? insulin NPH (HumuLIN,NovoLIN) injection 6 Units  6 Units Subcutaneous BID Delane Ginger, DO   6 Units at 11/25/16 9604   ??? insulin regular (HumuLIN,NovoLIN) injection 0-12 Units  0-12 Units Subcutaneous [facility] April Mirian [place], [place]       ??? insulin regular (HumuLIN,NovoLIN) injection 4 Units  4 Units Subcutaneous Q6H [facility] Delane Ginger, DO   4 Units at 11/25/16 5409   ??? lidocaine (LIDODERM) 5 % patch 1 patch  1 patch Transdermal Daily Delane Ginger, DO   1 patch at 11/25/16 0830   ??? melatonin tablet 3 mg  3 mg PEG Tube Nightly Caili Escalera A Sainburg, DO   3 mg at 11/24/16 2330   ??? mycophenolate (CELLCEPT) 200 mg/mL suspension 500 mg  500 mg G-tube BID Trellis Paganini [place], [place]   500 mg at 11/25/16 8119   ??? ocular lubricant (GENTEAL SEVERE) 0.3 % ophthalmic gel 1 drop  1 drop Both Eyes TID PRN Delane Ginger, DO       ??? pantoprazole (PROTONIX) granules 40 mg  40 mg G-tube BID Delane Ginger, DO   40 mg at 11/25/16 1478   ??? polyethylene glycol (MIRALAX) packet 17 g  17 g G-tube BID Delane Ginger, DO       ??? saliva stimulant mucosal spray  1 spray Topical Q2H PRN Delane Ginger, DO       ??? sildenafil (antihypertensive) (REVATIO) 10 mg/mL oral suspension 30 mg  30 mg PEG Tube Q8H SCH Gisselle Galvis A Sainburg, DO   30 mg at 11/25/16 0700   ??? sulfamethoxazole-trimethoprim (BACTRIM,SEPTRA) 200-40 mg/5 mL suspension 10 mL  10 mL PEG Tube Once per day on Mon Wed Fri Clarisse Gouge [place], DO   10 mL at 11/25/16 2956   ??? thiamine (B-1) tablet 100 mg  100 mg PEG Tube Daily Clarisse Gouge [place], DO   100 mg at 11/25/16 2130   ??? traMADol (ULTRAM) tablet 50 mg  50 mg PEG Tube Q4H PRN Delane Ginger, DO   50 mg at 11/25/16 1309   ??? valGANciclovir (VALCYTE) 50 mg/mL oral solution 450 mg  450 mg PEG Tube Daily Clarisse Gouge [place], DO   450 mg at 11/25/16 0841   ??? warfarin (COUMADIN) tablet 3 mg  3 mg PEG Tube Daily Delane Ginger, DO          Prescriptions Prior to Admission Medication Sig Dispense Refill Last Dose   ??? acetaminophen (TYLENOL) 650 mg/20.3 mL Soln 20.3 mL (650 mg total) by PEG Tube route every four (4) hours as needed.  0    ??? albuterol 2.5 mg/0.5 mL nebulizer solution Inhale 0.5 mL (2.5 mg total) by nebulization every six (6) hours as needed for wheezing. 30 each 12    ???  amiodarone (PACERONE) 200 MG tablet 1 tablet (200 mg total) by G-tube route daily. 30 tablet 11    ??? cycloSPORINE (NEORAL) 100 mg/mL microemulsion solution 2 mL (200 mg total) by G-tube route Two (2) times a day. 50 mL 12    ??? digoxin 62.5 mcg Tab 65 mcg by G-tube route daily.  0    ??? docusate (COLACE) 50 mg/5 mL liquid 10 mL (100 mg total) by G-tube route Two (2) times a day. 600 mL 0    ??? insulin NPH (HUMULIN,NOVOLIN) 100 unit/mL injection Inject 0.06 mL (6 Units total) under the skin Two (2) times a day (at 6am and 6pm). 3.6 mL 0    ??? insulin regular (HUMULIN,NOVOLIN) 100 unit/mL injection Inject 0-0.12 mL (0-12 Units total) under the skin every six (6) hours. 14.4 mL 0    ??? insulin regular (HUMULIN,NOVOLIN) 100 unit/mL injection Inject 0.04 mL (4 Units total) under the skin every six (6) hours. 4.8 mL 0    ??? melatonin 3 mg Tab 1 tablet (3 mg total) by PEG Tube route nightly.  0    ??? mycophenolate (CELLCEPT) 200 mg/mL suspension 2.5 mL (500 mg total) by G-tube route Two (2) times a day (8am and 8pm). 175 mL 12    ??? pantoprazole (PROTONIX) 40 mg GrPS 1 packet (40 mg total) by G-tube route daily. 30 packet 0    ??? polyethylene glycol (MIRALAX) 17 gram packet 17 g by G-tube route two (2) times a day as needed. 6 packet 0    ??? sildenafil, antihypertensive, (REVATIO) 10 mg/mL SusR oral suspension 3 mL (30 mg total) by PEG Tube route every eight (8) hours. 270 mL 0    ??? sulfamethoxazole-trimethoprim (BACTRIM,SEPTRA) 200-40 mg/5 mL suspension 10 mL by G-tube route 3 (three) times a week. 100 mL 0    ??? thiamine (B-1) 100 MG tablet 1 tablet (100 mg total) by G-tube route daily. 30 tablet 11    ??? traMADol (ULTRAM) 50 mg tablet 1 tablet (50 mg total) by G-tube route every six (6) hours as needed. for up to 5 days  0    ??? valGANciclovir (VALCYTE) 50 mg/mL SolR 9 mL (450 mg total) by PEG Tube route daily.  0    ??? warfarin (COUMADIN) 3 MG tablet 1 tablet (3 mg total) by G-tube route daily with evening meal.  0        Past Medical History:   Diagnosis Date   ??? Cancer (CMS-HCC)    ??? Cirrhosis (CMS-HCC)    ??? Diabetes (CMS-HCC)    ??? Hepatitis C 07/17/2012   ??? Liver disease    ??? Low back pain 07/17/2012   ??? Varices, esophageal (CMS-HCC)        Past Surgical History:   Procedure Laterality Date   ??? ANKLE SURGERY     ??? BACK SURGERY     ??? BACK SURGERY     ??? CHG US GUIDE, TISSUE ABLATION N/A 01/22/2016    Procedure: ULTRASOUND GUIDANCE FOR, AND MONITORING OF, PARENCHYMAL TISSUE ABLATION;  Surgeon: Particia Nearing, MD;  Location: MAIN OR [facility];  Service: Transplant   ??? IR EMBOLIZATION ORGAN ISCHEMIA, TUMORS, INFAR  06/16/2016    IR EMBOLIZATION ORGAN ISCHEMIA, TUMORS, INFAR 06/16/2016 Ammie Dalton, MD IMG VIR H&V [facility]   ??? PR COLON CA SCRN NOT HI RSK IND N/A 02/27/2015    Procedure: COLOREC CNCR SCR;COLNSCPY NO;  Surgeon: Vonda Antigua, MD;  [facility]: [facility] PROC[facility]URES [facility];  Service: Gastroenterology   ??? PR ENDOSCOPIC ULTRASOUND EXAM N/A 02/27/2015    Procedure: UGI ENDO; W/ENDO ULTRASOUND EXAM INCLUDES ESOPHAGUS, STOMACH, &/OR DUODENUM/JEJUNUM;  Surgeon: Vonda Antigua, MD;  Location: GI PROCEDURES [facility];  Service: Gastroenterology   ??? PR INSER HEART TEMP PACER ONE CHMBR N/A 10/02/2016    Procedure: Tempoarary Pacemaker Insertion;  Surgeon: Meredith Leeds, MD;  Location: [facility] EP;  Service: Cardiology   ??? PR LAP,DIAGNOSTIC ABDOMEN N/A 01/22/2016    Procedure: Laparoscopy, Abdomen, Peritoneum, & Omentum, Diagnostic, W/Wo Collection Specimen(S) By Brushing Or Washing;  Surgeon: Particia Nearing, MD;  Location: MAIN OR [facility]  Service: Transplant   ??? PR PLACE PERCUT GASTROSTOMY TUBE N/A 11/17/2016    Procedure: UGI ENDO; W/DIRECTED PLCMT PERQ GASTROSTOMY TUBE;  Surgeon: Cletis Athens, MD;  Location: GI PROCEDURES [facility];  Service: Gastroenterology   ??? PR TRACHEOSTOMY, PLANNED N/A 09/29/2016    Procedure: TRACHEOSTOMY PLANNED (SEPART PROC);  Surgeon: Katherina Mires, MD;  Location: MAIN OR [facility];  Service: Trauma   ??? PR TRANSCATH INSERT OR REPLACE LEADLESS PM VENTR N/A 10/11/2016    Procedure: Pacemaker Implant/Replace Leadless;  Surgeon: Meredith Leeds, MD;  Location: [facility] EP;  Service: Cardiology   ??? PR TRANSPLANT LIVER,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    Procedure: Liver Allotransplantation; Orthotopic, Partial Or Whole, From Cadaver Or Living Donor, Any Age;  Surgeon: Doyce Loose, MD;  Location: MAIN OR [facility];  Service: Transplant   ??? PR TRANSPLANT,PREP DONOR LIVER, WHOLE N/A 09/15/2016    Procedure: Rogelia Boga Std Prep Cad Donor Whole Liver Gft Prior Tnsplnt,Inc Chole,Diss/Rem Surr Tissu Wo Triseg/Lobe Splt;  Surgeon: Doyce Loose, MD;  Location: MAIN OR [facility];  Service: Transplant   ??? PR UPPER GI ENDOSCOPY,BIOPSY N/A 07/17/2012    Procedure: UGI ENDOSCOPY; WITH BIOPSY, SINGLE OR MULTIPLE;  Surgeon: Alba Destine, MD;  Location: GI PROCEDURES [facility];  Service: Gastroenterology   ??? PR UPPER GI ENDOSCOPY,DIAGNOSIS N/A 02/04/2014    Procedure: UGI ENDO, INCLUDE ESOPHAGUS, STOMACH, & DUODENUM &/OR JEJUNUM; DX W/WO COLLECTION SPECIMN, BY BRUSH OR WASH;  Surgeon: Wilburt Finlay, MD;  Location: GI PROCEDURES [facility];  Service: Gastroenterology   ??? PR UPPER GI ENDOSCOPY,LIGAT VARIX N/A 11/05/2013    Procedure: UGI ENDO; W/BAND LIG ESOPH &/OR GASTRIC VARICES;  Surgeon: Wilburt Finlay, MD;  Location: GI PROCEDURES [facility];  Service: Gastroenterology       Social History     Social History   ??? Marital status: Married     Spouse name: N/A   ??? Number of children: N/A   ??? Years of education: N/A     Social History Main Topics   ??? Smoking status: Never Smoker   ??? Smokeless tobacco: Never Used      Comment: Smoked in high school for about 5 years.    ??? Alcohol use No   ??? Drug use: No   ??? Sexual activity: Yes     Partners: Female     Birth control/ protection: None     Other Topics Concern   ??? None     Social History Narrative   ??? None       Family History   Problem Relation Age of Onset   ??? Hypertension Mother    ??? Cirrhosis Neg Hx    ??? Liver cancer Neg Hx        Code Status: Prior     Review of Systems     A 10-system  review of systems was conducted and was negative except as documented above in the HPI.       Objective        Temp:  [36.2 ??C-36.7 ??C] 36.2 ??C  Heart Rate:  [73-82] 80  Resp:  [17-18] 17  BP: (142-150)/(66-78) 148/78  MAP (mmHg):  [95-98] 98  SpO2:  [96 %-98 %] 98 %  No intake/output data recorded.    Physical Exam:  General Appearance: Chronically ill appearing. In no acute distress.  HEENT: Head is atraumatic and normocephalic.  Neck: Supple.  Lungs: Normal work of breathing.  Heart: Warm and well-perfused.  Abdomen: Nondistended.  Extremities: No clubbing, cyanosis, or edema.    Neurological Examination:     Mental Status: Alert, conversant, able to follow conversation and interview. Oriented to person, place, year, and month. Attention and concentration grossly intact to interview and physical. Spontaneous speech was fluent but hypophonic (secondary to mulitiple intubations). Comprehension was intact. Memory for recent and remote events was intact.    Cranial Nerves: Visual fields intact to direct confrontation.  PERRL. No diplopia on extraocular movement. Pursuit eye movements were uninterrupted with full range and without more than end-gaze nystagmus. Facial sensation intact bilaterally to light touch in all three divisions of CNV. Face symmetric at rest. Normal facial movement bilaterally, including forehead, eye closure and grimace/smile. Hearing intact to conversation. Shoulder shrug full strength bilaterally. Palate movement is symmetric. Tongue protrudes midline and tongue movements are normal.     Motor Exam: Increased tone and atrophy on LLE  Asterixis present  Resting tremor on right and left hand  Pronator drift is absent.    UE R/L: deltoid: 5/5, biceps: 5/5, triceps: 5/5, wrist flexion: 5/5, and finger flexion: 5/5.  LE R/L: hip flexion: 5/5, knee flexion: 5/4, knee extension: 5/5, dorsiflexion: 2/5 (weak secondary to prior surgery per patient), and plantar flexion: 5/5.    Reflexes: DTRs R/L: biceps: 2+/2+, triceps: 2+/2+, brachioradialis: 2+/2+, patella: 2+/1+ , ankle jerk: 2+/1+, and plantar: down/down.    Sensory: Sensation normal to light touch, pinprick in both hands and both feet   Sensation decreased to cold touch distally on lower extremities.  Position sense and vibration intact distally in the fingers and toes.   Joint position is intact at the DIPs bilaterally in the hands and halluces bilaterally in the toes.      Cerebellar/Coordination/Gait: Rapid alternating movements are normal in bilateral upper extremities. Finger-to-nose is normal without ataxia or dysmetria bilaterally.   Gait and Romberg exam deferred.        Diagnostic Studies      All Labs Last 24hrs:   Recent Results (from the past 24 hour(s))   POCT Glucose    Collection Time: 11/24/16  5:37 PM   Result Value Ref Range    Glucose, POC 203 (H) 65 - 179 mg/dL   POCT Glucose    Collection Time: 11/24/16  8:37 PM   Result Value Ref Range    Glucose, POC 69 65 - 179 mg/dL   POCT Glucose    Collection Time: 11/24/16 11:40 PM   Result Value Ref Range    Glucose, POC 150 65 - 179 mg/dL   POCT Glucose    Collection Time: 11/25/16  5:32 AM   Result Value Ref Range    Glucose, POC 150 65 - 179 mg/dL   Comprehensive Metabolic Panel    Collection Time: 11/25/16  6:54 AM   Result Value Ref Range    Sodium 138  135 - 145 mmol/L    Potassium 4.3 3.5 - 5.0 mmol/L    Chloride 101 98 - 107 mmol/L    CO2 34.0 (H) 22.0 - 30.0 mmol/L    BUN 18 7 - 21 mg/dL    Creatinine 1.61 0.96 - 1.30 mg/dL    BUN/Creatinine Ratio 19 EGFR MDRD Non Af Amer >=60 >=60 mL/min/1.62m2    EGFR MDRD Af Amer >=60 >=60 mL/min/1.48m2    Anion Gap 3 (L) 9 - 15 mmol/L    Glucose 156 65 - 179 mg/dL    Calcium 9.1 8.5 - 04.5 mg/dL    Albumin 3.0 (L) 3.5 - 5.0 g/dL    Total Protein 5.1 (L) 6.5 - 8.3 g/dL    Total Bilirubin 3.7 (H) 0.0 - 1.2 mg/dL    AST 409 (H) 19 - 55 U/L    ALT 297 (H) 19 - 72 U/L    Alkaline Phosphatase 455 (H) 38 - 126 U/L   PT-INR    Collection Time: 11/25/16  6:54 AM   Result Value Ref Range    PT 23.5 (H) 10.2 - 12.8 sec    INR 2.06    POCT Glucose    Collection Time: 11/25/16 11:30 AM   Result Value Ref Range    Glucose, POC 197 (H) 65 - 179 mg/dL

## 2016-11-25 NOTE — Unmapped (Signed)
Speech Language Pathology Clinical Swallow Assessment  Evaluation (11/25/16 1200)    Patient Name:  Jeffrey Ward       Medical Record Number: [id number]   Date of Birth: June 28, 1950  Sex: Male            SLP Treatment Diagnosis: Dysphagia  Activity Tolerance: Patient tolerated treatment well    Assessment  Patient presents with severe aspiration risk in the setting of recent tracheostomy, severe dysphonia, and known h/o recent severe dysphagia as visualized on MBSS 11/07/16. He tolerated limited trials of ice chips without overt signs of aspiration, however further PO offerings were not attempted given the aforementioned risk. Oral stage of swallowing appears grossly intact. Recommend patient remain NPO with the exception of limited ice chips following aggressive oral care for oral hydration/comfort and pharyngeal conditioning. Reintroduced pharyngeal strengthening exercises this date. Patient completed x10 effortful swallow and Masako maneuver with variable accuracy despite visual model and moderate cues. Will plan to repeat Modified Barium Swallow Study during AIR stay, likely early next week as medically appropriate. Would recommend ENT consult given severity of dysphonia and patient's reports of limited spontaneous recovery. Speech to follow 3-5x/week for dysphagia treatment.    Risk for Aspiration: Severe     Recommendations:  Consider ice chips PRN;NPO;Consider alternative nutrition;Modified barium swallow study;Dysphagia treatment      Diet Solids Recommendation: No solids, see liquids    Diet Liquids Recommendations: No liquids    Recommended Form of Medications: Feeding tube        Plan of Care  SLP Follow-up / Frequency: 1-2x per day-ST will utilize individual and group tx sessions as needed to target improved cognitive-linguistic function and social communication, 3-5x week Planned Treatment Duration : AIR stay pending ongoing progress toward goals    Treatment Goals:  Short Term Goal 1: Pt. will participate in repeat MBSS to evaluate pharyngeal dysphagia and help determine most appropriate SLP POC         Long Term Goal #1: Pt. will tolerate least restrictive diet without signs/symptoms aspiration by d/c       Patient and Family Goals: Return to PO diet    Subjective  Patient/Caregiver Reports: Patient endorses significant fatigue  Communication Preference: Verbal    Patient has no known allergies.  Current Facility-Administered Medications   Medication Dose Route Frequency Provider Last Rate Last Dose   ??? acetaminophen (TYLENOL) solution 650 mg  650 mg PEG Tube Q4H PRN Delane Ginger, DO       ??? albuterol 2.5 mg /3 mL (0.083 %) nebulizer solution 2.5 mg  2.5 mg Nebulization Q6H PRN Delane Ginger, DO   2.5 mg at 11/25/16 1116   ??? amiodarone (PACERONE) tablet 200 mg  200 mg PEG Tube Daily Clarisse Gouge [place], DO   200 mg at 11/25/16 0829   ??? cycloSPORINE (NEORAL) microemulsion solution 200 mg  200 mg PEG Tube BID Trellis Paganini [place], [place]       ??? digoxin (LANOXIN) tablet 62.5 mcg  62.5 mcg PEG Tube Daily Clarisse Gouge [place], DO   62.5 mcg at 11/25/16 0839   ??? docusate (COLACE) 50 mg/5 mL liquid 100 mg  100 mg G-tube BID Delane Ginger, DO   100 mg at 11/25/16 5621   ??? insulin NPH (HumuLIN,NovoLIN) injection 6 Units  6 Units Subcutaneous BID Delane Ginger, DO   6 Units at 11/25/16 3086   ??? insulin regular (HumuLIN,NovoLIN) injection 0-12 Units  0-12 Units Subcutaneous [facility] April  Mirian Capuchin, FNP       ??? insulin regular (HumuLIN,NovoLIN) injection 4 Units  4 Units Subcutaneous Q6H [facility] Delane Ginger, DO   4 Units at 11/25/16 0659   ??? lidocaine (LIDODERM) 5 % patch 1 patch  1 patch Transdermal Daily Delane Ginger, DO   1 patch at 11/25/16 0830   ??? melatonin tablet 3 mg  3 mg PEG Tube Nightly Daniel A Sainburg, DO   3 mg at 11/24/16 2330   ??? mycophenolate (CELLCEPT) 200 mg/mL suspension 500 mg  500 mg G-tube BID Trellis Paganini [place], [place]   500 mg at 11/25/16 5784   ??? ocular lubricant (GENTEAL SEVERE) 0.3 % ophthalmic gel 1 drop  1 drop Both Eyes TID PRN Delane Ginger, DO       ??? pantoprazole (PROTONIX) granules 40 mg  40 mg G-tube BID Delane Ginger, DO   40 mg at 11/25/16 6962   ??? polyethylene glycol (MIRALAX) packet 17 g  17 g G-tube BID Delane Ginger, DO       ??? saliva stimulant mucosal spray  1 spray Topical Q2H PRN Delane Ginger, DO       ??? sildenafil (antihypertensive) (REVATIO) 10 mg/mL oral suspension 30 mg  30 mg PEG Tube Q8H SCH Daniel A Sainburg, DO   30 mg at 11/25/16 0700   ??? sulfamethoxazole-trimethoprim (BACTRIM,SEPTRA) 200-40 mg/5 mL suspension 10 mL  10 mL PEG Tube Once per day on Mon Wed Fri Clarisse Gouge [place], DO   10 mL at 11/25/16 9528   ??? thiamine (B-1) tablet 100 mg  100 mg PEG Tube Daily Clarisse Gouge [place], DO   100 mg at 11/25/16 4132   ??? traMADol (ULTRAM) tablet 50 mg  50 mg PEG Tube Q4H PRN Delane Ginger, DO   50 mg at 11/25/16 1309   ??? valGANciclovir (VALCYTE) 50 mg/mL oral solution 450 mg  450 mg PEG Tube Daily Clarisse Gouge [place], DO   450 mg at 11/25/16 0841   ??? warfarin (COUMADIN) tablet 3 mg  3 mg PEG Tube Daily Delane Ginger, DO         Past Medical History:   Diagnosis Date   ??? Cancer (CMS-HCC)    ??? Cirrhosis (CMS-HCC)    ??? Diabetes (CMS-HCC)    ??? Hepatitis C 07/17/2012   ??? Liver disease    ??? Low back pain 07/17/2012   ??? Varices, esophageal (CMS-HCC)      Family History   Problem Relation Age of Onset   ??? Hypertension Mother    ??? Cirrhosis Neg Hx    ??? Liver cancer Neg Hx      Past Surgical History:   Procedure Laterality Date   ??? ANKLE SURGERY     ??? BACK SURGERY     ??? BACK SURGERY     ??? CHG US GUIDE, TISSUE ABLATION N/A 01/22/2016    Procedure: ULTRASOUND GUIDANCE FOR, AND MONITORING OF, PARENCHYMAL TISSUE ABLATION;  Surgeon: Particia Nearing, MD;  Location: MAIN OR [facility];  Service: Transplant   ??? IR EMBOLIZATION ORGAN ISCHEMIA, TUMORS, INFAR  06/16/2016    IR EMBOLIZATION ORGAN ISCHEMIA, TUMORS, INFAR 06/16/2016 Ammie Dalton, MD IMG VIR H&V [facility]   ??? PR COLON CA SCRN NOT HI RSK IND N/A 02/27/2015    Procedure: COLOREC CNCR SCR;COLNSCPY NO;  Surgeon: Vonda Antigua, MD;  Location: GI PROCEDURES MEMORIAL [facility];  Service: Gastroenterology   ??? PR ENDOSCOPIC ULTRASOUND EXAM N/A  02/27/2015    Procedure: UGI ENDO; W/ENDO ULTRASOUND EXAM INCLUDES ESOPHAGUS, STOMACH, &/OR DUODENUM/JEJUNUM;  Surgeon: Vonda Antigua, MD;  Location: GI PROCEDURES MEMORIAL [facility];  Service: Gastroenterology   ??? PR INSER HEART TEMP PACER ONE CHMBR N/A 10/02/2016    Procedure: Tempoarary Pacemaker Insertion;  Surgeon: Meredith Leeds, MD;  Location: [facility] EP;  Service: Cardiology   ??? PR LAP,DIAGNOSTIC ABDOMEN N/A 01/22/2016    Procedure: Laparoscopy, Abdomen, Peritoneum, & Omentum, Diagnostic, W/Wo Collection Specimen(S) By Brushing Or Washing;  Surgeon: Particia Nearing, MD;  Location: MAIN OR [facility];  Service: Transplant   ??? PR PLACE PERCUT GASTROSTOMY TUBE N/A 11/17/2016    Procedure: UGI ENDO; W/DIRECTED PLCMT PERQ GASTROSTOMY TUBE;  Surgeon: Cletis Athens, MD;  Location: GI PROCEDURES MEMOR[facility];  Service: Gastroenterology   ??? PR TRACHEOSTOMY, PLANNED N/A 09/29/2016    Procedure: TRACHEOSTOMY PLANNED (SEPART PROC);  Surgeon: Katherina Mires, MD;  Location: MAIN OR [facility];  Service: Trauma   ??? PR TRANSCATH INSERT OR REPLACE LEADLESS PM VENTR N/A 10/11/2016    Procedure: Pacemaker Implant/Replace Leadless;  Surgeon: Meredith Leeds, MD;  Location: [facility] EP;  Service: Cardiology   ??? PR TRANSPLANT LIVER,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    Procedure: Liver Allotransplantation; Orthotopic, Partial Or Whole, From Cadaver Or Living Donor, Any Age;  Surgeon: Doyce Loose, MD;  Location: MAIN OR [facility];  Service: Transplant   ??? PR TRANSPLANT,PREP DONOR LIVER, WHOLE N/A 09/15/2016    Procedure: Rogelia Boga Std Prep Cad Donor Whole Liver Gft Prior Tnsplnt,Inc Chole,Diss/Rem Surr Tissu Wo Triseg/Lobe Splt;  Surgeon: Doyce Loose, MD;  Location: MAIN OR [facility];  Service: Transplant   ??? PR UPPER GI ENDOSCOPY,BIOPSY N/A 07/17/2012    Procedure: UGI ENDOSCOPY; WITH BIOPSY, SINGLE OR MULTIPLE;  Surgeon: Alba Destine, MD;  Location: GI PROCEDURES [facility];  Service: Gastroenterology   ??? PR UPPER GI ENDOSCOPY,DIAGNOSIS N/A 02/04/2014    Procedure: UGI ENDO, INCLUDE ESOPHAGUS, STOMACH, & DUODENUM &/OR JEJUNUM; DX W/WO COLLECTION SPECIMN, BY BRUSH OR WASH;  Surgeon: Wilburt Finlay, MD;  Location: GI PROCEDURES [facility]OR[facility];  Service: Gastroenterology   ??? PR UPPER GI ENDOSCOPY,LIGAT VARIX N/A 11/05/2013    Procedure: UGI ENDO; W/BAND LIG ESOPH &/OR GASTRIC VARICES;  Surgeon: Wilburt Finlay, MD;  Location: GI PROC[facility]URES [facility];  Service: Gastroenterology     Social History   Substance Use Topics   ??? Smoking status: Never Smoker   ??? Smokeless tobacco: Never Used      Comment: Smoked in high school for about 5 years.    ??? Alcohol use No         General:  Current Functional Status: 66 y.o. 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 He is now s/p liver transplantation on 09/15/16. Post-op course complicated by decompensation thought to be secondary to acute right heart failure and AKI requiring CRRT. Severe PAH diagnosed.  New diagnosis of severe portopulmonary hypertension established 7/9. Respiratory distress resulting in tracheostomy placed 7/19. Now decannulated and PEG in place given severe pharyngeal dysphagia.       Pain Comments : C/o 8/10 back pain/HA, RN made aware    Precautions: Aspiration precautions     Medical Tests / Procedures Comments: CXR 9/9: The lungs are well-inflated. No focal airspace opacities.  Equipment/Environment: Supplemental oxygen (2L [place])  Objective  Temperature Spikes Noted: No  Respiratory Status : O2 via nasal cannula  History of Intubation: Yes          Behavior/Cognition: Lethargic;Cooperative  Positioning : Upright in bed    Oral / Motor Exam    Vocal Quality: Dysphonic  Volitional Swallow: Within Functional Limits   Labial ROM: Within Functional Limits   Labial Symmetry: Within Functional Limits  Labial Strength: Within Functional Limits   Lingual ROM: Within Functional Limits  Lingual Symmetry: Within Functional Limits  Lingual Strength: Within Functional Limits   Lingual Sensation: Within Functional Limits  Velum: Within Functional Limits   Mandible: Within Functional Limits  Coordination: WFL  Facial ROM: Within Functional Limits   Facial Symmetry: Within Functional Limits  Facial Strength: Within Functional Limits  Facial Sensation: Within Functional Limits   Vocal Intensity: Severely decreased   Apraxia: None present   Dysarthria: None present   Intelligibility: Intelligibility reduced   Breath Support: Adequate for speech   Dentition: Adequate    Consistencies assessed: Ice chips x4       Session Duration : 30      I attest that I have reviewed the above information.  Signed: Romilda Garret, SLP    [facility] 11/25/2016

## 2016-11-25 NOTE — Unmapped (Signed)
Physical Medicine and Rehab  [facility]&P [facility]   ??  ASSESSMENT:   ??  Jeffrey Ward is a 66 y.o. male admitted to [facility] for physical rehabilitation s/p liver transplant (09/15/16).  ??  Primary Rehab diagnosis: (Debility) 16 Debility (Non-Cardiac/Non-Pulmonary)  ??  Secondary diagnoses:   Principal Problem:    S/P liver transplant (CMS-HCC)  Active Problems:    Atrial fibrillation with RVR (CMS-HCC)    Pulmonary hypertension, moderate to severe (CMS-HCC)    Acute right heart failure (CMS-HCC)    Tracheostomy dependent (CMS-HCC)    Mixed level of activity delirium due to multiple etiologies, persistent  ??  PLAN:   ??  REHAB:   - PT and OT to maximize functional status with mobility and ADLs as well as prevention of joint contracture.   - SLP for cognitive and swallow function.  - RT for community re-integration, education, and leisure support services.  - Pharmacy consult for patient and family education on medication management.   - To be discussed in weekly Interdisciplinary Team Conference.  ??  S/p Liver transplant (09/15/16) For chronic hep C and HCC: Liver ultrasound on 11/03/16 showing mild to moderate intrahepatic biliary duct dilation which appeared to be new from prior ultrasound. Liver biopsy on 11/04/16 did not show acute rejection. Transplant will continue following closely while in acute inpatient rehabilitation.  - f/u transplant recs  - Continue cyclosporine, CellCept  - continue Valcyte, Bactrim for ppx  ??  GIB: Patient had GI bleed from an ulcer which was clipped on 11/20/16 by gastroenterology.   - Protonix 40 mg BID  ??  Paroxismal A-fib: Patient on digoxin, amiodarone, warfarin for a-fib. Patient recently had GI bleed and warfarin was restarted on 9/11. Most recent INR was 1.69 on 11/24/16. Goal INR is 2-3 per Dr. Azucena Cecil. Per Dr. Azucena Cecil the decision was made between transplant and GI that the patient did not need to be on heparin due to bleeding risk and does not need prophylaxis while going back onto warfarin. - Continue amiodarone, digoxin, warfarin  - follow up cardiology as outpatient  ??  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  ??  Right Heart Failure. Stable: Patient reportedly had right heart failure postop after transplant.   ??  Severe PAH: The patient's wife reports that he was told that he had pulmonary hypertension prior to admission to hospital. 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  DM:   - Endocrine following, follow-up recommendations  ??  Delirium: Patient was on Zyprexa 7.5 mg nightly which is being held due to prolonged QTc. Patient still has when necessary Zyprexa on. Psychiatry has been following and recommends twice weekly EKGs and repleting magnesium to between 2 and 4. The patient's delirium is reportedly improving prior to admission to acute inpatient rehabilitation  - Replete magnesium to between 2 and 4  - Hold Zyprexa  - EKGs weekly  ??  Dysphonia with dysphagia: The patient has been intubated multiple times throughout his hospitalization. Voice has been hoarse/hypophonic since intubations. Will consider ENT referral if it does not improve within a few days of admission to acute inpatient rehabilitation.  - currently on tube feeds  - consider ENT consult  ??  Nausea/Vomiting: Patient had nausea and vomiting with tube feeds as recently as the morning of 11/24/16. This was thought to be due to high residuals. Tube feed rate was slowed. We'll continue to monitor for symptoms.  ??  Distal LE weakness/dysesthesias: Patient with left lower extremity weakness and numbness on the dorsal aspect of the left foot since surgery. He reports that he has not developed some numbness to the right great toe. 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 Could also have critical illness polyneuropathy/polyneuropathy though this would be expected to be more proximal than distal and symmetric. May consider EMG/NCS if this does not improve.  ??  FEN/PPX:  - DVT ppx: High risk for VTE given immobility. Per Dr. Azucena Cecil with transplant, the decision was made between transplant and GI that the patient should restart warfarin without any heparin or other DVT prophylaxis given bleeding risk.  - Diet: NPO  - Tube feeds via Gtube with Jevity 1.5 formula @50ml /h continuous and 50cc FWF before and after each bolus.  ??  DISPO: Admitted to Rehab floor, patient will be discussed at next interdisciplinary team conference.   ??  Estimated Length of Stay: 12-14 days  ??  Anticipated Post-Rehab Destination / Needs: home  ??  Medical Necessity: ??The patient requires acute inpatient rehabilitation to maximize functional independence and requires daily physician visits for monitoring/management of cardiopulmonary function s/p intubation and tracheostomy now decannulated, liver function s/p Liver transplant on immunosuppression, vital signs, anticoagulation, neurological exam, delirium, medications, skin and pain control. This patient's rehabilitation goals and medical complexity could not adequately be managed in a less intensive setting. ??Potential risks for clinical complications include falls, adverse medication reactions, DVT/PE, pressure ulcers, aspiration, urinary tract infection, dehydration/malnutrition, respiratory failure, worsening of underlying disease and bleeding.  ??  Medical Prognosis:??Good for continued progress and participation with therapy.  ??  Anticipated Interdisciplinary Rehabilitation Interventions: Activity tolerance: Patient is expected to tolerate minimum of 3 hrs of therapy daily @ 5x/week. Physical therapy to work on mobility. Occupational therapy to work on self care. Speech therapy to work on speech and swallowing. Recreational therapy for community reintegration and relaxation training. Prosthetics &??Orthotics for bracing as indicated. Rehab Nursing to work on medication administration, patient/family education, skin care, fall prevention, bowel and bladder management, vital signs and feeding/nutrition. Weekly interdisciplinary team conference to assess progress and plan of care changes.  ??  Expected Functional Outcomes:  Expected level of improvement: Goals for inpatient rehabilitation are mod I for functional mobility, ambulation with appropriate assistive devices and mod I to supervision basic ADLs upper and lower limbs.  ??  SUBJECTIVE:   ??  Reason for Admission: Comprehensive interdisciplinary inpatient rehabilitation program.  ??  History of Present Illness: Khalen Styer is a 66 y.o. male with PMHx as noted below that presents to [facility] for Physical rehabilitation after a liver transplant and long hospitalization.  ??  Brief hospital course below quoted from Dr. Louie Boston note on 11/24/16  ??  Doye Probasco??is a 66 y.o.??male??with a history of IDDM, GERD, prior perioperative a-fib with RVR and 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 Post-op course complicated by decompensation thought to be secondary to acute right heart failure and AKI requiring CRRT (resolved). 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 Severe PAH diagnosed. Eventually had episodes of a. Fib/tachyarrythmia x3, converted with amiodarone, then with digoxin; Cardiology following closely. On 11/03/16 he had a significant spike in his LFTs suspicious for acute rejection; however liver biopsy from 11/04/16 did not show any evidence of rejection.   ??  Underwent G tube on 11/17/2016. ??Hep gtt restarted for  bridging to coumadin. Developed GIB on 9/9 and underwent intubation for EGD. Small ulcer at GEJ was injected with epinephrine and clipped.     Today, the patient reports she is doing well. He states that he has not had black stools in the past few days. He also denies any red blood in his stool, blood in his vomit, abdominal pain, chills. The patient admits to nausea and vomiting this morning which were attributed to his tube feeds. The patient states that his bowels and bladder have been moving regularly since his catheter was removed. The patient states that he feels like his legs are weak since his transplant. He states that his arms do not feel weak. He states that his left leg feels weaker than his right. He also states that his left foot is numb which she localizes to the dorsal aspect of his left foot. He states that his right great toe has started to become numb over the past day or 2 but his left foot numbness has been there since his surgery.  ??  Pre-Morbid Functional Status: Pre-Admission Functional Status:   Presumed independent Activities of Daily Living:   Bathing: set up & supervision in sitting; min assist if standing to wash perineal/buttocks-simulated task  Grooming: set up in sitting  Dressing: UB=set up; LB=min assist. ??Pt able to complete figure four position w/ RLE to doff/donn sock, unable with LLE 2/2 c/o increased back pain therefore completed via forward flexion, no c/o abdominal pain. ??Pt required min assist for dynamic standing balance when simulating pulling clothing up/down.  Eating: total assist-tube feeds  Toileting: min assist for dynamic standing balance during clothing management  IADLs: NT  ??  Mobility:??  Bed Mobility: Supine to sit with min A for log rolling ??  Transfers:Sit<>stand with CGA using RW   Balance:??MIN A dynamic standing balance  Gait:Ambulated x 50 ft with min A for steadying and min A for control of walker along with min VCs due to decreased safety awareness and fast pace   ??  Cognition, Swallow, Speech: AOx4, clear speech, following commands, NPO; enteral nutrition   ??  Patient's Judgement Adequate to Safely Complete Daily Activities: Yes  Patient's Memory Adequate to Safely Complete Daily Activities: Yes  Patient Able to Express Needs/Desires: Yes Patient has speech problem: No  ??  Assistive Devices: None   ??   ??   ??      ??  Precautions:  Protective precautions, Seizure precautions, falls precautions, skin integrity   ??  Medical / Surgical History:   Past Medical History        Past Medical History:   Diagnosis Date   ??? Cancer (CMS-HCC) ??   ??? Cirrhosis (CMS-HCC) ??   ??? Diabetes (CMS-HCC) ??   ??? Hepatitis C 07/17/2012   ??? Liver disease ??   ??? Low back pain 07/17/2012   ??? Varices, esophageal (CMS-HCC) ??      ??  Past Surgical History         Past Surgical History:   Procedure Laterality Date   ??? ANKLE SURGERY ?? ??   ??? BACK SURGERY ?? ??   ??? BACK SURGERY ?? ??   ??? CHG US GUIDE, TISSUE ABLATION N/A 01/22/2016   ?? Procedure: ULTRASOUND GUIDANCE FOR, AND MONITORING OF, PARENCHYMAL TISSUE ABLATION;  Surgeon: Particia Nearing, MD;  Location: MAIN OR [facility];  Service: Transplant   ??? IR EMBOLIZATION ORGAN ISCHEMIA, TUMORS, INFAR ?? 06/16/2016   ?? IR EMBOLIZATION ORGAN  ISCHEMIA, TUMORS, INFAR 06/16/2016 Ammie Dalton, MD IMG VIR [facility]   ??? PR COLON CA SCRN NOT HI RSK IND N/A 02/27/2015   ?? Procedure: COLOREC CNCR SCR;COLNSCPY NO;  Surgeon: Vonda Antigua, MD;  Location: GI PROCEDURES [facility];  Service: Gastroenterology   ??? PR ENDOSCOPIC ULTRASOUND EXAM N/A 02/27/2015   ?? Procedure: UGI ENDO; W/ENDO ULTRASOUND EXAM INCLUDES ESOPHAGUS, STOMACH, &/OR DUODENUM/JEJUNUM;  Surgeon: Vonda Antigua, MD;  Location: GI PROCEDURES [facility];  Service: Gastroenterology   ??? PR INSER HEART TEMP PACER ONE CHMBR N/A 10/02/2016   ?? Procedure: Tempoarary Pacemaker Insertion;  Surgeon: Meredith Leeds, MD;  Location: [facility];  Service: Cardiology   ??? PR LAP,DIAGNOSTIC ABDOMEN N/A 01/22/2016   ?? Procedure: Laparoscopy, Abdomen, Peritoneum, & Omentum, Diagnostic, W/Wo Collection Specimen(S) By Brushing Or Washing;  Surgeon: Particia Nearing, MD;  Location: MAIN OR [facility];  Service: Transplant   ??? PR PLACE PERCUT GASTROSTOMY TUBE N/A 11/17/2016   ?? Procedure: UGI ENDO; W/DIRECTED PLCMT PERQ GASTROSTOMY TUBE;  Surgeon: Cletis Athens, MD;  Location: GI PROCEDURES [facility];  Service: Gastroenterology   ??? PR TRACHEOSTOMY, PLANNED N/A 09/29/2016   ?? Procedure: TRACHEOSTOMY PLANNED (SEPART PROC);  Surgeon: Katherina Mires, MD;  Location: MAIN OR [facility];  Service: Trauma   ??? PR TRANSCATH INSERT OR REPLACE LEADLESS PM VENTR N/A 10/11/2016   ?? Procedure: Pacemaker Implant/Replace Leadless;  Surgeon: Meredith Leeds, MD;  Location: [facility];  Service: Cardiology   ??? PR TRANSPLANT LIVER,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   ?? Procedure: Liver Allotransplantation; Orthotopic, Partial Or Whole, From Cadaver Or Living Donor, Any Age;  Surgeon: Doyce Loose, MD;  Location: MAIN OR [facility];  Service: Transplant   ??? PR TRANSPLANT,PREP DONOR LIVER, WHOLE N/A 09/15/2016   ?? Procedure: Rogelia Boga Std Prep Cad Donor Whole Liver Gft Prior Tnsplnt,Inc Chole,Diss/Rem Surr Tissu Wo Triseg/Lobe Splt;  Surgeon: Doyce Loose, MD;  Location: MAIN OR [facility];  Service: Transplant   ??? PR UPPER GI ENDOSCOPY,BIOPSY N/A 07/17/2012   ?? Procedure: UGI ENDOSCOPY; WITH BIOPSY, SINGLE OR MULTIPLE;  Surgeon: Alba Destine, MD;  Location: GI PROCEDURES [facility];  Service: Gastroenterology   ??? PR UPPER GI ENDOSCOPY,DIAGNOSIS N/A 02/04/2014   ?? Procedure: UGI ENDO, INCLUDE ESOPHAGUS, STOMACH, & DUODENUM &/OR JEJUNUM; DX W/WO COLLECTION SPECIMN, BY BRUSH OR WASH;  Surgeon: Wilburt Finlay, MD;  Location: GI PROCEDURES [facility];  Service: Gastroenterology   ??? PR UPPER GI ENDOSCOPY,LIGAT VARIX N/A 11/05/2013   ?? Procedure: UGI ENDO; W/BAND LIG ESOPH &/OR GASTRIC VARICES;  Surgeon: Wilburt Finlay, MD;  Location: GI PROCEDURES [facility];  Service: Gastroenterology      ??  Social History:   Tobacco: Denies, never smoker    Alcohol: Denies  Illicits: Denies   Home Environment: patient lives with wife??in a 1??level house, 7??steps at entry with bilateral railings, 0??step inside house  Employment: retired, previously worked Acupuncturist  ??  Family History:   family history includes Hypertension in his mother.  ??  Allergies:   Patient has no known allergies.  ??  Medications at Discharge from Acute Hospital: Reviewed      ??    Your Medication List   ??    STOP taking these medications    CENTRUM SILVER ORAL  ??   furosemide 20 MG tablet  Commonly known as:  LASIX  ??   insulin degludec 200 unit/mL (3 mL) Inpn  ??   lancing device Misc  ??  levoFLOXacin 500 MG tablet  Commonly known as:  LEVAQUIN  ??   pen needle, diabetic 31 gauge x 5/16 Ndle  ??   rifAXIMin 550 mg Tab  Commonly known as:  XIFAXAN  ??   spironolactone 50 MG tablet  Commonly known as:  ALDACTONE  ??   sucralfate 1 gram tablet  Commonly known as:  CARAFATE  ??   VITAMIN D3 2,000 unit Cap  Generic drug:  cholecalciferol (vitamin D3)  ??   ??   START taking these medications    acetaminophen 650 mg/20.3 mL Soln  Commonly known as:  TYLENOL  20.3 mL (650 mg total) by PEG Tube route every four (4) hours as needed.  ??   albuterol 2.5 mg/0.5 mL nebulizer solution  Inhale 0.5 mL (2.5 mg total) by nebulization every six (6) hours as needed for wheezing.  ??   amiodarone 200 MG tablet  Commonly known as:  PACERONE  1 tablet (200 mg total) by G-tube route daily.  ??   cycloSPORINE 100 mg/mL microemulsion solution  Commonly known as:  NEORAL  2 mL (200 mg total) by G-tube route Two (2) times a day.  ??   digoxin 62.5 mcg Tab  65 mcg by G-tube route daily.  ??   docusate 50 mg/5 mL liquid  Commonly known as:  COLACE  10 mL (100 mg total) by G-tube route Two (2) times a day.  ??   insulin NPH 100 unit/mL injection  Commonly known as:  HumuLIN,NovoLIN  Inject 0.06 mL (6 Units total) under the skin Two (2) times a day (at 6am and 6pm).  ??   insulin regular 100 unit/mL injection  Commonly known as:  HumuLIN,NovoLIN  Inject 0-0.12 mL (0-12 Units total) under the skin every six (6) hours.  ??   insulin regular 100 unit/mL injection  Commonly known as:  HumuLIN,NovoLIN  Inject 0.04 mL (4 Units total) under the skin every six (6) hours.  ??   melatonin 3 mg Tab  1 tablet (3 mg total) by PEG Tube route nightly.  ??   mycophenolate 200 mg/mL suspension  Commonly known as:  CELLCEPT  2.5 mL (500 mg total) by G-tube route Two (2) times a day (8am and 8pm).  ??   pantoprazole 40 mg Grps  Commonly known as:  PROTONIX  1 packet (40 mg total) by G-tube route daily.  ??   polyethylene glycol 17 gram packet  Commonly known as:  MIRALAX  17 g by G-tube route two (2) times a day as needed.  ??   sildenafil (antihypertensive) 10 mg/mL Susr oral suspension  Commonly known as:  REVATIO  3 mL (30 mg total) by PEG Tube route every eight (8) hours.  ??   sulfamethoxazole-trimethoprim 200-40 mg/5 mL suspension  Commonly known as:  BACTRIM,SEPTRA  10 mL by G-tube route 3 (three) times a week.  ??   thiamine 100 MG tablet  Commonly known as:  B-1  1 tablet (100 mg total) by G-tube route daily.  ??   traMADol 50 mg tablet  Commonly known as:  ULTRAM  1 tablet (50 mg total) by G-tube route every six (6) hours as needed. for up to 5 days  ??   valGANciclovir 50 mg/mL Solr  Commonly known as:  VALCYTE  9 mL (450 mg total) by PEG Tube route daily.  ??   warfarin 3 MG tablet  Commonly known as:  COUMADIN  1 tablet (3 mg total) by  G-tube route daily with evening meal.  ??   ??   ??  Review of Systems:    Full 10 systems reviewed and negative, other than as noted in the HPI.  ??  OBJECTIVE:   ??  Vitals:  Temp:  [35.4 ??C-36.7 ??C] 36.5 ??C  Heart Rate:  [75-88] 80  Resp:  [17-18] 18  BP: (131-142)/(61-66) 142/66  MAP (mmHg):  [92-95] 95  SpO2:  [92 %-100 %] 97 %  ??  Physical Exam:  GEN: NAD laying in bed  EYES: EOMI  HENT: Hearing adequate for conversation, soft hoarse voice  RESP: NWOB on 2L O2 [place]  ABD: soft, NTND  SKIN: no visible rashes   MSK: no notable contractures  NEURO:              Mental Status: A&Ox3, can follow 3 step commands, speech fluid and coherent, follows commands well and answers questions appropriately              Sensory: Hypoesthesia to soft touch on the dorsal left foot to the crease in the ankle. Sensation to soft touch intact throughout bilateral upper and lower extremities otherwise including the plantar aspects of both feet              Motor:               - RUE: grip 5/5, finger abduction 5/5, we 5/5, ef 5/5, ee 5/5, shoulder abd 5/5              - LUE: grip 5/5, finger abduction 5/5, we 5/5, ef 5/5, ee 5/5, shoulder abd 5/5              - RLE: hf 5/5 df 4+/5, pf 4+/5, EHL 0/5, ke 5/5,               - LLE:  hf 4/5, df 4-/5, pf 4-/5, EHL 0/5 ke 4/5,               Tone: within normal limits, no spasticity noted  PSYCH: mood euthymic, affect appropriate, thought process logical             ??  Labs and Diagnostic Studies: Reviewed  CBC:  ??  Recent Labs  Lab Units 11/24/16  0512 11/23/16  0459 11/22/16  0550   WBC 10*9/L 3.0* 2.5* 3.4*   RBC 10*12/L 2.74* 2.84* 2.76*   HEMOGLOBIN g/dL 8.7* 8.5* 8.5*   HEMATOCRIT % 25.4* 26.5* 25.3*   MCV fL 92.6 93.5 91.5   MCH pg 31.8 30.1 30.9   MCHC g/dL 29.5 62.1 30.8   RDW % 22.5* 21.6* 22.8*   PLATELET COUNT (1) 10*9/L 95* 118* 101*   MPV fL 8.5 8.1 9.0   ??  CMP:  ??  Recent Labs  Lab Units 11/24/16  0512 11/23/16  0459 11/22/16  0550   SODIUM mmol/L 141 141 139   POTASSIUM mmol/L 3.4* 3.7 3.7   CHLORIDE mmol/L 100 102 104   CO2 mmol/L 34.0* 32.0* 30.0   BUN mg/dL 24* 23* 20   CREATININE mg/dL 6.57 8.46 9.62   GLUCOSE mg/dL 952 841 324   CALCIUM mg/dL 8.9 9.0 9.0   MAGNESIUM mg/dL 2.0 1.4* 1.6   PHOSPHORUS mg/dL 3.2 3.4 3.7   ALBUMIN g/dL 2.7* 2.8* 2.7*   PROTEIN TOTAL g/dL 4.8* 5.0* 4.8*   BILIRUBIN TOTAL mg/dL 2.0* 1.0 1.1   ALK PHOS U/L 295* 160* 164*   ALT U/L 64  37 43   AST U/L 104* 22 19   ??  ??  COAGS:  Recent Labs  Lab Units 11/24/16  0512 11/23/16  0459 11/22/16  0550 ?? 11/20/16  0512 11/19/16  1700 11/19/16  0845   INR ?? 1.69 1.74 2.32  < > 1.96  --   --    PT sec 19.3* 19.8* 26.5*  < > 22.3*  --   --    APTT sec  --   --   --   --  136.7* 145.0* 186.2*   < > = values in this interval not displayed.  ??  Radiology Results: Reviewed  ??  Delane Ginger, DO  [facility] Physical Medicine and Rehabilitation

## 2016-11-25 NOTE — Unmapped (Signed)
Care Management  Initial Transition Planning Assessment              General  Care Manager assessed the patient by : In person interview with patient, In person interview with family, Medical record review, Discussion with Clinical Care team (CM obtained collateral from patient's wife; PT switched his schedule and saw him earlier. SIL was present as well. )  Orientation Level: Other (Comment) (Unable to assess. )  Who provides care at home?: N/A (independent prior to Transplant. )         Medical Provider(s): [name], MD  Reason for Admission: Admitting Diagnosis:  debility  Past Medical History:   has a past medical history of Cancer (CMS-HCC); Cirrhosis (CMS-HCC); Diabetes (CMS-HCC); Hepatitis C (07/17/2012); Liver disease; Low back pain (07/17/2012); and Varices, esophageal (CMS-HCC).  Past Surgical History:   has a past surgical history that includes Back surgery; pr upper gi endoscopy,biopsy (N/A, 07/17/2012); Back surgery; Ankle surgery; pr upper gi endoscopy,ligat varix (N/A, 11/05/2013); pr upper gi endoscopy,diagnosis (N/A, 02/04/2014); pr colon ca scrn not hi rsk ind (N/A, 02/27/2015); pr endoscopic ultrasound exam (N/A, 02/27/2015); chg US guide, tissue ablation (N/A, 01/22/2016); pr lap,diagnostic abdomen (N/A, 01/22/2016); IR Embolization Organ Ischemia, Tumors, Infar (06/16/2016); pr tracheostomy, planned (N/A, 09/29/2016); pr inser heart temp pacer one chmbr (N/A, 10/02/2016); pr transplant liver,allotransplant (N/A, 09/15/2016); pr transplant,prep donor liver, whole (N/A, 09/15/2016); 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   Previous admit date: 09/15/2016    Primary Insurance- Payor: MEDICARE / Plan: MEDICARE PART A AND PART B / Product Type: *No Product type* /   Secondary Insurance ??? Secondary Insurance  AARP  Prescription Coverage ??? Above  Preferred Pharmacy - [facility] - [place], [place] - [address]  [facility] - [name], [place] - [address]  [facility] - [place], [place] - [address]    Transportation home: TBD    Contact/Decision Maker:    Writer Details: Engineer, civil (consulting), Secondary Contact  Primary Contact Name: Swade Shonka  Primary Contact Relationship: Spouse  Phone #1: [phone]  Secondary Contact Name: Lucendia Herrlich   Secondary Contact Relationship: Other (Comment) (Sister in [place])  Phone #3: [phone]    Advance Directive (Medical Treatment)  Does patient have an advance directive covering medical treatment?: Patient does not have advance directive covering medical treatment.  Type of advance directive: : Health care power of attorney, Living will  Reason patient does not have an advance directive covering medical treatment:: Patient does not wish to complete one at this time (Wife declined for now. Wife is his NOK. )  Surrogate decision maker appointed:: No  Reason there is not a surrogate decision maker appointed:: Patient does not wish to appoint a surrogate decision maker at this time  Surrogate decision maker's name:: Durk Carmen- spouse  Information provided on advance directive:: No  Patient requests assistance:: No    Advance Directive (Mental Health Treatment)  Does patient have an advance directive covering mental health treatment?: Patient does not have advance directive covering mental health treatment., Patient would not like information.  Reason patient does not have an advance directive covering mental health treatment:: Patient does not wish to complete one at this time.    Patient Information:    Lives with: Spouse/significant other, Family members (Wife and Grandson)    Type of Residence: Private residence  Mailing Address:  [address]  [place] [address]   Physical Address: [address] [place], [place] [address]  Patient Phone Number: No longer has a cell. [phone] (home)   Location/Detail: 7 STE from the garage with rails; one level home    Support Systems: Spouse, Family Members, Comment (Wife will be patient's primary support but his grandson and SIL have offered to help as well. )    Responsibilities/Dependents at home?: No    Home Care services in place prior to admission?: No (Wife reports he had HH last year after a hospitalization. )     Outpatient/Community Resources in place prior to admission: Clinic     Equipment Currently Used at Home: cane, straight, glucometer (Used a cane occasionally. )     Currently receiving outpatient dialysis?: No     Financial Information:     Patient source of income: Haematologist.     Need for financial assistance?: No     Discharge Needs Assessment:    Concerns to be Addressed: adjustment to diagnosis/illness, care coordination/care conferences, discharge planning    Clinical Risk Factors: > 65, New Diagnosis, Multiple Diagnoses (Chronic), Functional Limitations    Barriers to taking medications: No    Prior overnight hospital stay or ED visit in last 90 days: Yes    Readmission Within the Last 30 Days: planned readmission (Acute to AIR admission)    Anticipated Changes Related to Illness: inability to care for self    Equipment Needed After Discharge: other (see comments) (Possibly oxygen, will try to wean patient. TBD regarding other HME)    Discharge Facility/Level of Care Needs: other (see comments) (TBD)    Patient at risk for readmission?: Yes    Discharge Plan:    Screen findings are: Discharge planning needs identified or anticipated (Comment).    Expected Discharge Date:  (Will team on Tuesday)    Expected Transfer from Critical Care:  (NA)    Patient and/or family were provided with choice of facilities / services that are available and appropriate to meet post hospital care needs?: N/A       Initial Assessment complete?: Yes

## 2016-11-25 NOTE — Unmapped (Signed)
OCCUPATIONAL THERAPY  Evaluation (11/25/16 0901)    Patient Name:  Jeffrey Ward       Medical Record Number: [id number]   Date of Birth: 29-Dec-1950  Sex: Male          OT Treatment Diagnosis:  Decreased activity tolerance, t/f status, functional strength, functional mobility impacting safety and independence with ADLs and functional mobility.    Assessment  Pt is a 66 y.o. 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 Post-op course complicated by acute right heart failure, AKI requiring CRRT, bradycardia requiring pacemaker placement, severe PAH, a. Fib/tachyarrythmia x3, converted with amiodarone, then with digoxin, and GIB with GEJ ulcer clipped. As a result pt has experienced decreased transfer and ADL status and would benefit from skilled AIR OT services to address deficits, provide pt/caregiver education, maximize pt safety and independence during daily routines and promote return to PLOF.  After review of pt's occupational profile and prior medical history, assessment of occupational performance, clinical decision making, and development of POC, this is considered to be a moderate level complexity evaluation.    Today's Interventions: Adaptive equipment;ADL retraining;Bed mobility;Education - Patient;Safety education;Transfer training;Functional mobility    Activity Tolerance During Today's Session  Patient limited by fatigue;Patient limited by pain    Plan  Planned Frequency of Treatment:  1-2 hours per day for: 5-7 days per week  Planned Treatment Duration: 14 days    Planned Interventions:  Adaptive equipment;ADL retraining;Balance activities;Bed mobility;Compensatory tech. training;Conservation;Education - Patient;Education - Family / caregiver;Endurance activities;Functional cognition;Functional mobility;Home exercise program;Modalities;Safety education;Range of motion;Positioning;Postular / Proximal stability;Therapeutic exercise;UE Strength / coordination exercise;Transfer training    Post-Discharge Occupational Therapy Recommendations:  OT Post Acute Discharge Recommendations: To be determined    ;    OT DME Recommendations: Tub Transfer Bench    GOALS:   Patient and Family Goals: To be walking and talking by the time I go home.     Long Term Goal #1: Pt will be grossly Mod I for all functional transfers by d/c.       Short Term:  Pt will complete toilet transfer with S using LRAD.    Time Frame : 1 week  Pt will complete LB dressing with s/u and LRAD.    Time Frame : 1 week  Pt will complete standing therapeutic activity or ADL at sink for >5 min with LRAD.    Time Frame : 1 week  Pt will shower with Min A and LRAD.    Time Frame : 1 week  Pt. will complete BSC transfer with min A in preparation for rectal tube removal   Time Frame : 2 weeks    Prognosis:  Good  Positive Indicators:  PLOF, supportive wife, motivation  Barriers to Discharge: Endurance deficits;Functional strength deficits;Inability to safely perform ADLS;Impaired Balance    Subjective  Current Status Pt supine in bed before and after session.  Call bell in reach.  Prior Functional Status Pt reported being independent prior to hospitalization. He is a retired Acupuncturist and him and his wife are currently building a new house.     Medical Tests / Procedures: reviewed  Services patient receives: OT;SLP;PT  Patient / Caregiver reports: I'm tired and I have a headache.    Past Medical History:   Diagnosis Date   ??? Cancer (CMS-HCC)    ??? Cirrhosis (CMS-HCC)    ??? Diabetes (  CMS-HCC)    ??? Hepatitis C 07/17/2012   ??? Liver disease    ??? Low back pain 07/17/2012   ??? Varices, esophageal (CMS-HCC)     Social History   Substance Use Topics   ??? Smoking status: Never Smoker   ??? Smokeless tobacco: Never Used      Comment: Smoked in high school for about 5 years.    ??? Alcohol use No      Past Surgical History: Procedure Laterality Date   ??? ANKLE SURGERY     ??? BACK SURGERY     ??? BACK SURGERY     ??? CHG US GUIDE, TISSUE ABLATION N/A 01/22/2016    Procedure: ULTRASOUND GUIDANCE FOR, AND MONITORING OF, PARENCHYMAL TISSUE ABLATION;  Surgeon: Particia Nearing, MD;  [facility];  Service: Transplant   ??? IR EMBOLIZATION ORGAN ISCHEMIA, TUMORS, INFAR  06/16/2016    IR EMBOLIZATION ORGAN ISCHEMIA, TUMORS, INFAR 06/16/2016 Ammie Dalton, MD IMG VIR [facility]   ??? PR COLON CA SCRN NOT HI RSK IND N/A 02/27/2015    Procedure: COLOREC CNCR SCR;COLNSCPY NO;  Surgeon: Vonda Antigua, MD;  Location: GI PROCEDURES [facility]IAL [facility];  Service: Gastroenterology   ??? PR ENDOSCOPIC ULTRASOUND EXAM N/A 02/27/2015    Procedure: UGI ENDO; W/ENDO ULTRASOUND EXAM INCLUDES ESOPHAGUS, STOMACH, &/OR DUODENUM/JEJUNUM;  Surgeon: Vonda Antigua, MD;  Location: GI PROCEDURES MEM[facility]IAL [facility];  Service: Gastroenterology   ??? PR INSER HEART TEMP PACER ONE CHMBR N/A 10/02/2016    Procedure: Tempoarary Pacemaker Insertion;  Surgeon: Meredith Leeds, MD;  Location: [facility];  Service: Cardiology   ??? PR LAP,DIAGNOSTIC ABDOMEN N/A 01/22/2016    Procedure: Laparoscopy, Abdomen, Peritoneum, & Omentum, Diagnostic, W/Wo Collection Specimen(S) By Brushing Or Washing;  Surgeon: Particia Nearing, MD;  Location: MAIN OR [facility];  Service: Transplant   ??? PR PLACE PERCUT GASTROSTOMY TUBE N/A 11/17/2016    Procedure: UGI ENDO; W/DIRECTED PLCMT PERQ GASTROSTOMY TUBE;  Surgeon: Cletis Athens, MD;  Location: GI PROCEDURES [facility];  Service: Gastroenterology   ??? PR TRACHEOSTOMY, PLANNED N/A 09/29/2016    Procedure: TRACHEOSTOMY PLANNED (SEPART PROC);  Surgeon: Katherina Mires, MD;  Location: MAIN OR [facility];  Service: Trauma   ??? PR TRANSCATH INSERT OR REPLACE LEADLESS PM VENTR N/A 10/11/2016    Procedure: Pacemaker Implant/Replace Leadless;  Surgeon: Meredith Leeds, MD;  Location: [facility];  Service: Cardiology   ??? PR TRANSPLANT LIVER,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    Procedure: Liver Allotransplantation; Orthotopic, Partial Or Whole, From Cadaver Or Living Donor, Any Age;  Surgeon: Doyce Loose, MD;  Location: MAIN OR [facility];  Service: Transplant   ??? PR TRANSPLANT,PREP DONOR LIVER, WHOLE N/A 09/15/2016    Procedure: Rogelia Boga Std Prep Cad Donor Whole Liver Gft Prior Tnsplnt,Inc Chole,Diss/Rem Surr Tissu Wo Triseg/Lobe Splt;  Surgeon: Doyce Loose, MD;  Location: MAIN OR [facility];  Service: Transplant   ??? PR UPPER GI ENDOSCOPY,BIOPSY N/A 07/17/2012    Procedure: UGI ENDOSCOPY; WITH BIOPSY, SINGLE OR MULTIPLE;  Surgeon: Alba Destine, MD;  Location: GI PROCEDURES [facility];  Service: Gastroenterology   ??? PR UPPER GI ENDOSCOPY,DIAGNOSIS N/A 02/04/2014    Procedure: UGI ENDO, INCLUDE ESOPHAGUS, STOMACH, & DUODENUM &/OR JEJUNUM; DX W/WO COLLECTION SPECIMN, BY BRUSH OR WASH;  Surgeon: Wilburt Finlay, MD;  Location: GI PROCEDURES [facility];  Service: Gastroenterology   ??? PR UPPER GI ENDOSCOPY,LIGAT VARIX N/A 11/05/2013    Procedure: UGI ENDO; W/BAND LIG ESOPH &/OR GASTRIC VARICES;  Surgeon: Wilburt Finlay, MD;  Location: GI PROCEDURES [facility];  Service: Gastroenterology    Family History   Problem Relation Age of Onset   ??? Hypertension Mother    ??? Cirrhosis Neg Hx    ??? Liver cancer Neg Hx         Patient has no known allergies.     Objective Findings  Precautions / Restrictions  Falls precautions;Other precautions;Neutropenic precautions;Pacemaker precautions (abdominal)    Weight Bearing  Non-applicable (LUE 5lb restriction 5 weeks starting 11/11/2016)    Required Braces or Orthoses  Non-applicable    Communication Preference  Verbal    Pain  7/10, pt received pain medication from RN about an hour before OT session per RN.      Equipment / Environment  Gastric tube;Vascular access (PIV, TLC, Port-a-cath, PICC);Supplemental oxygen;Bedside Commode;Hospital Bed    Living Situation   Living environment: House   Lives With: Spouse   Home Living: One level home;Grab bars around toilet;Grab bars in shower;Walk-in shower;Garden tub;Built-in shower seat;Standard height toilet;Stairs to enter with rails   Equipment available at home: None   Rail placement (outside): Rail on left side        Cognition   Orientation Level:  Oriented x 4   Arousal/Alertness:  Appropriate responses to stimuli   Attention Span:  Appears intact   Memory:  Appears intact   Following Commands:  Follows all commands and directions without difficulty   Safety Judgment:  Good awareness of safety precautions   Awareness of Errors:  Good awareness of safety precautions   Problem Solving:  Able to problem solve independently   Comments:      Vision / Perception  Vision: Wears glasses all the time (did not have them with him today)  Perception: WFL       Hand Function  Hand Dominance: right  B grasp WFL, L: 4/5, R: 4+/5; slight tremor noted in both hands during dressing tasks    Skin Inspection  visible skin intact    ROM / Strength/Coordination  UE ROM/ Strength/ Coordination: AROM WFL; MMT WFL, grossly 4/5 for shoulder and elbow flexion/extension; decreased muscular endurance for sustained activity  LE ROM/ Strength/ Coordination: See PT eval    Sensation:  Pt reported L great & 2nd toe numbness x2 weeks impacting his proprioception & balance in both feet.    Balance:  static/dynamic sitting balace: SBA static standing: CGA    Mobility/Gait/Transfers: supine>EOB with Min A; Pt transfered from EOB<>BSC with Min A for balance support.     ADL:  Bathing: s/u while seated at EOB; Min A to stand to wash peri area  Grooming: S/u while seated at EOB  Dressing: UB: s/u, LB: Min A. Pt able to thread R leg for both underwear and pants however unable to thread second leg. Once threaded by therapist, pt able to pull paints to waist. Pt required Total A to don socks             Vitals/ Orthostatics:  At Rest: BP 155/72 HR 96 O2 99 on 2L [place]  With Activity: SOB, but O2 remains approximate to initial reading Orthostatics: NAD    Interventions Performed During Today's Session: Therapist conducted initial AIR OT Eval, educated pt on role of AIR OT, DME use (BSC), his POC, and provided assist with ADLs (grooming, bathing at EOB, dressing, toileting)         Eval Duration (OT): 60 Min.    Medical Staff Made Aware: RN  Megha    As a part of my new employee training, I have completed the therapy assessment, intervention and documentation for the patient stated above under the direction and guidance of Vikki Ports, OT    I attest that I have reviewed the above information.  Signed: Leeroy Bock, OT  Filed 11/25/2016

## 2016-11-25 NOTE — Unmapped (Signed)
Transplant Surgery Progress Note    Hospital Day: 2    Assessment:     Jeffrey Ward??is a 66 y.o.??male??with a history of IDDM, GERD, prior perioperative a-fib with RVR and 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 Post-op course complicated by decompensation secondary to acute right heart failure, AKI requiring CRRT (resolved), severe pulmonary hypertension responsive to sildenafil, bradycardia/ asystole requiring pacemaker placement, atrial fibrilliation/ narrow complex tachycardia requiring anti-coagulation with warfarin and amiodarone/ digoxin, malnutrition/ debility, and altered mental status/ delirium improved with transition to cyclosporine and psychiatric medications. On 11/03/16 he had a significant spike in his LFTs s/p biopsy 8/24 negative for rejection. He continued to recover and was transferred to floor status 8/28. The patient was transferred back to the SICU 9/9 2/2 GI bleed following PEG placement. Once resuscitated and stabilized, he was transferred back to the floor 9/10. He was evaluated by AIR 9/13 and accepted for admission.     LFTs were somewhat elevated yesterday but considerably higher today- t bili 3.7, AST 318, ALT 297, ALKP 455.       Plan:     -- Liver transplant [place]  -- Follow-up labs, [place] results to determine need for ERCP vs liver biopsy  -- Continue to monitor Cyclosporine and adjust according to trough      Subjective/Interval Events:   Complaining of back pain- worked with PT yesterday in rehab. New elevation to LFTs.      Objective:        Vital Signs:  BP 148/78  - Pulse 80  - Temp 36.2 ??C (97.2 ??F) (Oral)  - Resp 17  - Wt 68.4 kg (150 lb 12.7 oz)  - SpO2 98%  - BMI 27.75 kg/m??     Input/Output:  I/O last 3 completed shifts:  In: 670 [NG/GT:670]  Out: 550 [Urine:550]    Physical Exam:  General: No acute distress, resting slumped to side  Neuro: Awake and interactive  Lungs: Normal work of breathing on room air, decannulation site healing well    Abdomen: Non-distended    Lab Results   Component Value Date    WBC 3.0 (L) 11/24/2016    HGB 8.7 (L) 11/24/2016    HCT 25.4 (L) 11/24/2016    PLT 95 (L) 11/24/2016       Lab Results   Component Value Date    NA 138 11/25/2016    K 4.3 11/25/2016    CL 101 11/25/2016    CO2 34.0 (H) 11/25/2016    BUN 18 11/25/2016    CREATININE 0.96 11/25/2016    CALCIUM 9.1 11/25/2016    MG 2.0 11/24/2016    PHOS 3.2 11/24/2016       Imaging:  No new imaging

## 2016-11-25 NOTE — Unmapped (Signed)
Endocrinology Consult Note    Requesting Attending Physician :  Merrily Brittle, DO  Service Requesting Consult : Physical Medicine and Rehabilitation [facility])  Primary Care Provider: [name], MD  Outpatient Endocrinologist: N/A    Assessment/Recommendations:    66 yo male with h/o DM2 who s/p liver transplant. Endo consulted for management of DM in the setting of recent initiation of prednisone and tacrolimus and now on 14h tube feeds.     1. Type 2 diabetes, uncontrolled:   *Due to hypoglycemia following correction dose SS was discontinued;  would add back correction but use the regular insulin sensitive scale q6h and revise target from 150 to  200.  - continue  NPH 6u BID (do not hold if NPO). Previously hypoglycemic on 8 units when NPO.  - continue nutritional dose of regular insulin 4u Q6hrs   - revise correction to the senstive scale-regular insulin Q6hrs.        -Advise of any changes to steroid dose or to diet    2. Nutrition:  Variable,thus complicating overall glycemic control. Currently NPO,TF at goal rate rate of 60 mL/hr  --- at goal, order will provide 1260 mL, 2268 kcal, 102 g PRO, 202 g CHO, 919 mL free water w/ additional FWF per team  ??  Eventual bolus regimen per notes (will likely require additional insulin for this):  --- start with 120 mL, run over pump x 1 hr   --- increase bolus by 30 mL q4 hrs   --- final goal: 1 can (237 mL) 5x daily (6a, 10a, 2p, 6p, 10p)              At goal, order will provide 1185 mL, 2133 kcal, 96 g PRO, 190        g CHO,  865 mL free water      3. Potential for amio induced thyroid dysfunction  Amio started 8/14  -Baseline TSH checked: 4.0  -Check TFT q6 months      Thank you for the consult. Communicated to team and orders revised. We will continue to follow patient as needed. Please call/page [phone] with questions or concerns.         History of Present Illness: :    Reason for Consult:   DM2, exacerbated by steroids and calcineurin inhibitor    Jeffrey Ward is a 66 y.o. male w/ h/o DM2, Afib, pulmonary HTN, HCV cirrhosis admitted for <principal problem not specified> on 7/5. I have been asked to evaluate Jeffrey Ward for DM management. Post-op course was complicated by volume overload thought to be due to CHF exacerbation, respiratory failure now trached and on vent, and AKI requiring CRRT. A1c was 8.0 prior to surgery. He is on prednisone 2.5 mg qd since surgery, along with tacrolimus. He was transitioned from insulin gtt to sq POD1, while still NPO. NPH 15u BID achieved BG 130-200 at that time. However, once he started tube feeds, glucoses rose to 300s so he was switched back to insulin gtt on 7/18. He is currently on continuous tube feeds at goal rate, comprising 324 gm CHO per day.     Interval history: Transferred to rehab. BG doing well overall but was hypoglycemic following correction dose.     All Medications:     Current Facility-Administered Medications   Medication Dose Route Frequency Provider Last Rate Last Dose   ??? acetaminophen (TYLENOL) solution 650 mg  650 mg PEG Tube Q4H PRN Delane Ginger, DO       ???  albuterol 2.5 mg /3 mL (0.083 %) nebulizer solution 2.5 mg  2.5 mg Nebulization Q6H PRN Delane Ginger, DO       ??? amiodarone (PACERONE) tablet 200 mg  200 mg PEG Tube Daily Daniel A Sainburg, DO   200 mg at 11/25/16 0829   ??? cycloSPORINE (NEORAL) microemulsion solution 200 mg  200 mg PEG Tube BID Trellis Paganini [place], [place]       ??? digoxin (LANOXIN) tablet 62.5 mcg  62.5 mcg PEG Tube Daily Clarisse Gouge Sainburg, DO   62.5 mcg at 11/25/16 0839   ??? docusate (COLACE) 50 mg/5 mL liquid 100 mg  100 mg G-tube BID Delane Ginger, DO   100 mg at 11/25/16 1610   ??? insulin NPH (HumuLIN,NovoLIN) injection 6 Units  6 Units Subcutaneous BID Delane Ginger, DO   6 Units at 11/25/16 9604   ??? insulin regular (HumuLIN,NovoLIN) injection 4 Units  4 Units Subcutaneous Q6H [facility] Delane Ginger, DO   4 Units at 11/25/16 0659   ??? lidocaine (LIDODERM) 5 % patch 1 patch  1 patch Transdermal Daily Delane Ginger, DO   1 patch at 11/25/16 0830   ??? melatonin tablet 3 mg  3 mg PEG Tube Nightly Daniel A Sainburg, DO   3 mg at 11/24/16 2330   ??? mycophenolate (CELLCEPT) 200 mg/mL suspension 500 mg  500 mg G-tube BID Trellis Paganini [place], [place]   500 mg at 11/25/16 5409   ??? ocular lubricant (GENTEAL SEVERE) 0.3 % ophthalmic gel 1 drop  1 drop Both Eyes TID PRN Delane Ginger, DO       ??? pantoprazole (PROTONIX) granules 40 mg  40 mg G-tube BID Delane Ginger, DO   40 mg at 11/25/16 8119   ??? polyethylene glycol (MIRALAX) packet 17 g  17 g G-tube BID Delane Ginger, DO       ??? saliva stimulant mucosal spray  1 spray Topical Q2H PRN Delane Ginger, DO       ??? sildenafil (antihypertensive) (REVATIO) 10 mg/mL oral suspension 30 mg  30 mg PEG Tube Q8H SCH Daniel A Sainburg, DO   30 mg at 11/25/16 0700   ??? sulfamethoxazole-trimethoprim (BACTRIM,SEPTRA) 200-40 mg/5 mL suspension 10 mL  10 mL PEG Tube Once per day on Mon Wed Fri Clarisse Gouge Sainburg, DO   10 mL at 11/25/16 1478   ??? thiamine (B-1) tablet 100 mg  100 mg PEG Tube Daily Clarisse Gouge Sainburg, DO   100 mg at 11/25/16 2956   ??? traMADol (ULTRAM) tablet 50 mg  50 mg PEG Tube Q6H PRN Delane Ginger, DO   50 mg at 11/25/16 0829   ??? valGANciclovir (VALCYTE) 50 mg/mL oral solution 450 mg  450 mg PEG Tube Daily Daniel A Sainburg, DO   450 mg at 11/25/16 0841   ??? warfarin (COUMADIN) tablet 3 mg  3 mg PEG Tube Daily Delane Ginger, DO         Review of Systems:    Unable to complete due to patient factors.    Objective: :    Patient Vitals for the past 8 hrs:   BP Temp Temp src Pulse Resp SpO2   11/25/16 0839 - - - 80 - -   11/25/16 0500 148/78 36.2 ??C (97.2 ??F) Oral 73 17 98 %       No intake/output data recorded.    Physical Exam:  General appearance -not examined  Test Results    Glucose POC all reviewed.        Data Review  Lab Results   Component Value Date    A1C 8.0 (H) 09/14/2016    A1C 7.8 (H) 09/05/2016    A1C 8.5 (H) 07/20/2016     Lab Results   Component Value Date    GFR >= 60 02/15/2012    CREATININE 0.96 11/25/2016

## 2016-11-25 NOTE — Unmapped (Signed)
Problem: Patient Care Overview  Goal: Plan of Care Review  Outcome: Progressing    Goal: Individualization and Mutuality  Outcome: Progressing    Goal: Discharge Needs Assessment  Outcome: Progressing    Goal: Interprofessional Rounds/Family Conf  Outcome: Progressing      Problem: Self-Care Deficit (Adult,Obstetrics,Pediatric)  Goal: Identify Related Risk Factors and Signs and Symptoms  Related risk factors and signs and symptoms are identified upon initiation of Human Response Clinical Practice Guideline (CPG).   Outcome: Progressing    Goal: Improved Ability to Perform BADL and IADL  Patient will demonstrate the desired outcomes by discharge/transition of care.   Outcome: Progressing      Problem: Fall Risk (Adult)  Goal: Identify Related Risk Factors and Signs and Symptoms  Related risk factors and signs and symptoms are identified upon initiation of Human Response Clinical Practice Guideline (CPG).   Outcome: Progressing    Goal: Absence of Fall  Patient will demonstrate the desired outcomes by discharge/transition of care.   Outcome: Progressing      Problem: Activity Intolerance (Adult)  Goal: Identify Related Risk Factors and Signs and Symptoms  Related risk factors and signs and symptoms are identified upon initiation of Human Response Clinical Practice Guideline (CPG).  Outcome: Progressing    Goal: Activity Tolerance  Patient will demonstrate the desired outcomes by discharge/transition of care.  Outcome: Progressing    Goal: Effective Energy Conservation Techniques  Patient will demonstrate the desired outcomes by discharge/transition of care.  Outcome: Progressing      Problem: Nutrition, Imbalanced: Inadequate Oral Intake (Adult)  Goal: Identify Related Risk Factors and Signs and Symptoms  Related risk factors and signs and symptoms are identified upon initiation of Human Response Clinical Practice Guideline (CPG).  Outcome: Progressing    Goal: Improved Oral Intake  Patient will demonstrate the desired outcomes by discharge/transition of care.  Outcome: Progressing    Goal: Prevent Further Weight Loss  Patient will demonstrate the desired outcomes by discharge/transition of care.  Outcome: Progressing      Problem: Infection, Risk/Actual (Adult)  Goal: Identify Related Risk Factors and Signs and Symptoms  Related risk factors and signs and symptoms are identified upon initiation of Human Response Clinical Practice Guideline (CPG).  Outcome: Progressing    Goal: Infection Prevention/Resolution  Patient will demonstrate the desired outcomes by discharge/transition of care.  Outcome: Progressing

## 2016-11-25 NOTE — Unmapped (Signed)
Physical Medicine and Rehabilitation  Daily Progress Note [facility]    ASSESSMENT:     ??  Jeffrey Ward??is a 66 y.o.??male??admitted to [facility] for physical rehabilitation s/p liver transplant (09/15/16).  ??  Primary Rehab diagnosis: (Debility) 16 Debility (Non-Cardiac/Non-Pulmonary)    PLAN:     REHAB:   - PT and OT to maximize functional status with mobility and ADLs as well as prevention of joint contracture.   - SLP for cognitive and swallow function.  - RT for community re-integration, education, and leisure support services.  - Pharmacy consult for patient and family education on medication management.   - To be discussed in weekly Interdisciplinary Team Conference.  ??  S/p Liver transplant (09/15/16) For chronic hep C and HCC: Liver ultrasound on 11/03/16 showing mild to moderate intrahepatic biliary duct dilation which appeared to be new from prior ultrasound. Liver biopsy on 11/04/16 did not show acute rejection. Transplant will continue following closely while in acute inpatient rehabilitation. The patient had significantly worsening LFTs on 11/25/16. Transplant was made aware and they said they will round on him this am.  - f/u transplant recs  - Continue cyclosporine, CellCept  - continue Valcyte, Bactrim for ppx  ??  GIB: Patient had GI bleed from an ulcer which was clipped on 11/20/16 by gastroenterology.   - Protonix 40 mg BID  ??  Paroxismal A-fib: Patient on digoxin, amiodarone, warfarin for a-fib. Patient recently had GI bleed and warfarin was restarted on 9/11. Most recent INR was 1.69 on 11/24/16. Goal INR is 2-3 per Dr. Azucena Cecil. Per Dr. Azucena Cecil the decision was made between transplant and GI that the patient did not need to be on heparin due to bleeding risk and does not need prophylaxis while going back onto warfarin.  - Continue amiodarone, digoxin, warfarin  - follow up cardiology as outpatient  ??  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 ??  Right Heart Failure. Stable: Patient reportedly had right heart failure postop after transplant.   ??  Severe PAH: The patient's wife reports that he was told that he had pulmonary hypertension prior to admission to hospital. 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  DM:   - Endocrine following, follow-up recommendations  ??  Delirium: Patient was on Zyprexa 7.5 mg nightly which is being held due to prolonged QTc. Patient still has when necessary Zyprexa on. Psychiatry has been following and recommends twice weekly EKGs and repleting magnesium to between 2 and 4. The patient's delirium is reportedly improving prior to admission to acute inpatient rehabilitation  - Replete magnesium to between 2 and 4  - Hold Zyprexa  - EKGs weekly  ??  Dysphonia with dysphagia: The patient has been intubated multiple times throughout his hospitalization. Voice has been hoarse/hypophonic since intubations. Will consider ENT referral if it does not improve within a few days of admission to acute inpatient rehabilitation.  - currently on tube feeds  - consider ENT consult  ??  Nausea/Vomiting: Patient had nausea and vomiting with tube feeds as recently as the morning of 11/24/16. This was thought to be due to high residuals. Tube feed rate was slowed. We'll continue to monitor for symptoms.  ??  Distal LE weakness/dysesthesias: On AIR admission, patient with left lower extremity weakness and numbness on the dorsal aspect of the left foot since surgery. He reported that he has developed some numbness to the right great toe. The  patient developed a spreading dysesthesias of his legs to the mid tibial area bilaterally the day following admission to rehabilitation. 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 May consider EMG/NCS if this does not improve.  - consult neuro    DISPO: Patient to be discussed at weekly interdisciplinary team conference.   - ELOS: TBD.     SUBJECTIVE:     NAEON. The patient is complaining of worsening back pain as well as worsening bilateral leg tingling since yesterday. He had been doing well in terms of breathing and was off of his oxygen when he went to the room. He stated he felt more short of breath off the oxygen and felt better when we put her back on 2 L. The patient denies any bowel or bladder incontinence. He states his most recent bowel movement was this morning and was normal. He states he is still urinating without issue. The patient denies chills or any new symptoms other than what is listed above.    OBJECTIVE:     Vital signs (last 24 hours):  Temp:  [36.2 ??C-36.7 ??C] 36.2 ??C  Heart Rate:  [73-82] 80  Resp:  [17-18] 17  BP: (142-150)/(66-78) 148/78  MAP (mmHg):  [95-98] 98  SpO2:  [96 %-98 %] 98 %    Intake/Output (last 3 shifts):  I/O last 3 completed shifts:  In: 670 [NG/GT:670]  Out: 550 [Urine:550]    Physical Exam:  GEN: NAD laying in bed  EYES: EOMI  HENT: Hearing adequate for conversation, soft hoarse voice  RESP: The patient appeared to have normal work of breathing on room air though reported that he felt short of breath. We put 2 L nasal cannula on him and he was breathing normally and no longer felt short of breath  ABD: soft, NTND  SKIN: no visible rashes   MSK: no notable contractures  NEURO:  ????????????????????????Mental Status: A&Ox3, can follow 3 step commands, speech fluid and coherent, follows commands well and answers questions appropriately  ????????????????????????Sensory: Hypoesthesia to soft touch??distal to the distal third of the tibia bilaterally  ????????????????????????Motor:   ????????????????????????- RUE: grip 5/5, finger abduction 5/5, we 5/5, ef 5/5, ee 5/5, shoulder abd 5/5  ????????????????????????- LUE: grip 5/5, finger abduction 5/5, we 5/5, ef 5/5, ee 5/5, shoulder abd 5/5  ????????????????????????- RLE: hf 5/5 df 4+/5, pf 4+/5, EHL 0/5, ke 5/5,   ????????????????????????- LLE: ??hf 4/5, df 4-/5, pf 4-/5, EHL 0/5 ke 4/5,   ????????????????????????Tone: within normal limits, no spasticity noted  PSYCH: mood euthymic, affect appropriate??????    Delane Ginger, DO  [facility] Physical Medicine and Rehabilitation

## 2016-11-26 LAB — COMPREHENSIVE METABOLIC PANEL
ALBUMIN: 2.8 g/dL — ABNORMAL LOW (ref 3.5–5.0)
ALKALINE PHOSPHATASE: 395 U/L — ABNORMAL HIGH (ref 38–126)
ALT (SGPT): 249 U/L — ABNORMAL HIGH (ref 19–72)
ANION GAP: 3 mmol/L — ABNORMAL LOW (ref 9–15)
AST (SGOT): 129 U/L — ABNORMAL HIGH (ref 19–55)
BILIRUBIN TOTAL: 1.6 mg/dL — ABNORMAL HIGH (ref 0.0–1.2)
BUN / CREAT RATIO: 14
CALCIUM: 8.8 mg/dL (ref 8.5–10.2)
CHLORIDE: 101 mmol/L (ref 98–107)
CO2: 33 mmol/L — ABNORMAL HIGH (ref 22.0–30.0)
CREATININE: 0.86 mg/dL (ref 0.70–1.30)
EGFR MDRD AF AMER: >=60 mL/min/{1.73_m2} (ref >=60)
EGFR MDRD NON AF AMER: >=60 mL/min/{1.73_m2} (ref >=60)
GLUCOSE RANDOM: 125 mg/dL (ref 65–179)
POTASSIUM: 3.8 mmol/L (ref 3.5–5.0)
PROTEIN TOTAL: 5 g/dL — ABNORMAL LOW (ref 6.5–8.3)
SODIUM: 137 mmol/L (ref 135–145)

## 2016-11-26 LAB — CREATININE: Creatinine:MCnc:Pt:Ser/Plas:Qn:: 0.86

## 2016-11-26 LAB — PROTIME-INR: PROTIME: 27.8 s — ABNORMAL HIGH (ref 10.2–12.8)

## 2016-11-26 LAB — PROTIME: Lab: 27.8 — ABNORMAL HIGH

## 2016-11-26 NOTE — Unmapped (Signed)
Physical Medicine and Rehabilitation  Daily Progress Note [facility]    ASSESSMENT:     ??  Jeffrey Ward??is a 66 y.o.??male??admitted to [facility] for physical rehabilitation s/p liver transplant (09/15/16).  ??  Primary Rehab diagnosis: (Debility) 16 Debility (Non-Cardiac/Non-Pulmonary)    PLAN:     REHAB:   - PT and OT to maximize functional status with mobility and ADLs as well as prevention of joint contracture.   - SLP for cognitive and swallow function.  - RT for community re-integration, education, and leisure support services.  - Pharmacy consult for patient and family education on medication management.   - To be discussed in weekly Interdisciplinary Team Conference.  ??  S/p Liver transplant (09/15/16) For chronic hep C and HCC: Liver ultrasound on 11/03/16 showing mild to moderate intrahepatic biliary duct dilation which appeared to be new from prior ultrasound. Liver biopsy on 11/04/16 did not show acute rejection. Transplant will continue following closely while in acute inpatient rehabilitation. The patient had significantly worsening LFTs on 11/25/16 and are trending down as of 9/15  - f/u transplant recs  - Continue cyclosporine, CellCept  - continue Valcyte, Bactrim for ppx  ??  GIB: Patient had GI bleed from an ulcer which was clipped on 11/20/16 by gastroenterology.   - Protonix 40 mg BID  ??  Paroxismal A-fib: Patient on digoxin, amiodarone, warfarin for a-fib. Patient recently had GI bleed and warfarin was restarted on 9/11. Most recent INR was 1.69 on 11/24/16. Goal INR is 2-3 per Dr. Azucena Cecil. Per Dr. Azucena Cecil the decision was made between transplant and GI that the patient did not need to be on heparin due to bleeding risk and does not need prophylaxis while going back onto warfarin.  - Continue amiodarone, digoxin, warfarin  - follow up cardiology as outpatient  ??  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  ??  Right Heart Failure. Stable: Patient reportedly had right heart failure postop after transplant.   ??  Severe PAH: The patient's wife reports that he was told that he had pulmonary hypertension prior to admission to hospital. 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  DM:   - Endocrine following, follow-up recommendations  ??  Delirium: Patient was on Zyprexa 7.5 mg nightly which is being held due to prolonged QTc. Patient still has when necessary Zyprexa on. Psychiatry has been following and recommends twice weekly EKGs and repleting magnesium to between 2 and 4. The patient's delirium is reportedly improving prior to admission to acute inpatient rehabilitation  - Replete magnesium to between 2 and 4  - Hold Zyprexa  - EKGs weekly  ??  Dysphonia with dysphagia: The patient has been intubated multiple times throughout his hospitalization. Voice has been hoarse/hypophonic since intubations. Will consider ENT referral if it does not improve within a few days of admission to acute inpatient rehabilitation.  - currently on tube feeds  - consider ENT consult  ??  Nausea/Vomiting: Patient had nausea and vomiting with tube feeds as recently as the morning of 11/24/16. This was thought to be due to high residuals. Tube feed rate was slowed. We'll continue to monitor for symptoms.  ??  Distal LE weakness/dysesthesias: On AIR admission, patient with left lower extremity weakness and numbness on the dorsal aspect of the left foot since surgery. He reported that he has developed some numbness to the right great toe. The patient developed a spreading dysesthesias of  his legs to the mid tibial area bilaterally the day following admission to rehabilitation. 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  - consulted neuro, appreciate recommendations - MRI lumbar spine wo contrast to evaluate for possible lumosacral radiculopathy     DISPO: Patient to be discussed at weekly interdisciplinary team conference.   - ELOS: TBD.     SUBJECTIVE:     NAEON. Endorsed nausea and refused tube feeds last night and this morning. Continues to endorse back and leg pain. LFTs decreased from yesterday.    OBJECTIVE:     Vital signs (last 24 hours):  Temp:  [35.8 ??C-37 ??C] 35.8 ??C  Heart Rate:  [84-94] 84  Resp:  [18-20] 20  BP: (163-171)/(74-77) 171/77  SpO2:  [100 %] 100 %    Intake/Output (last 3 shifts):  I/O last 3 completed shifts:  In: 740 [NG/GT:740]  Out: 1050 [Urine:1050]    Physical Exam:  GEN: NAD laying in bed  EYES: EOMI  HENT: Hearing adequate for conversation, soft hoarse voice  RESP: NWOB on 2L [place]  ABD: soft, NTND  SKIN: no visible rashes   MSK: no notable contractures  NEURO:  ????????????????????????Mental Status: A&Ox3, follows commands well and answers questions appropriately  ????????????????????????Tone: within normal limits, no spasticity noted  PSYCH: mood euthymic, affect appropriate

## 2016-11-26 NOTE — Unmapped (Signed)
BILIARY/ADVANCED GASTROENTEROLOGY INPATIENT CONSULTATION H&P      Requesting Attending Physician:  Merrily Brittle, DO  Requesting Consult Service: Rehabilitation    Reason for Consult:    Jeffrey Ward is a 66 y.o. male seen in consultation at the request of Dr. Merrily Brittle, DO for evaluation of jaundice.    Assessment and Recommendations:     Jeffrey Ward is a 66 y.o. 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 Pending review of imaging a retained stone could also be considered. Biopsy on 8/24 was without evidence of acute cellular rejection and was significant for minimal central lobular cholestasis.  - Please make NPO at midnight 9/16 for ERCP 9/17    Thank you for this consult. Discussed with  with Dr. Edyth Gunnels. We will continue to follow along actively. Please page Biliary/Advanced fellow on call with questions.     Hunt Oris, MD  Gastroenterology and Hepatology Fellow    History of Present Illness:      Chief Complaint: Jaundice post OLT    This is a 66 y.o. male with PMHx as noted below who presents with jaundice and elevated transaminases about 9 weeks after OLT for HCV cirrhosis c/b HCC. Following transplant bilirubin normalized on 09/30/2016. His alkaline phosphatase rose on 7/26 along with his ALT to 74 and AST from 12 to 52. Thereafter AST and ALT returned to normal but alkaline phosphatase remained elevated until 8/22, when he had more significant transaminitis to 554 AST and 329 ALT, which was accompanied by a rise and fall of bilirubin to maximum 5.2, resulting in a liver biopsy and increases in immunosuppression. His transaminitis again resolved but bilirubin rose again 9/13 to 2.0 and 9/14 to 3.7. He reports his abdominal pain started with surgery and has been stable through recent days and weeks.    Review of Systems:  The balance of 12 systems reviewed is negative except as noted in the HPI.     Medical History:     Past Medical History:   Diagnosis Date   ??? Cancer (CMS-HCC)    ??? Cirrhosis (CMS-HCC)    ??? Diabetes (CMS-HCC)    ??? Hepatitis C 07/17/2012   ??? Liver disease    ??? Low back pain 07/17/2012   ??? Varices, esophageal (CMS-HCC)      Past Surgical History:   Procedure Laterality Date   ??? ANKLE SURGERY     ??? BACK SURGERY     ??? BACK SURGERY     ??? CHG US GUIDE, TISSUE ABLATION N/A 01/22/2016    Procedure: ULTRASOUND GUIDANCE FOR, AND MONITORING OF, PARENCHYMAL TISSUE ABLATION;  Surgeon: Particia Nearing, MD;  Location: MAIN OR [facility];  Service: Transplant   ??? IR EMBOLIZATION ORGAN ISCHEMIA, TUMORS, INFAR  06/16/2016    IR EMBOLIZATION ORGAN ISCHEMIA, TUMORS, INFAR 06/16/2016 Ammie Dalton, MD IMG VIR H&V [facility]   ??? PR COLON CA SCRN NOT HI RSK IND N/A 02/27/2015    Procedure: COLOREC CNCR SCR;COLNSCPY NO;  Surgeon: Vonda Antigua, MD;  Location: GI PROCEDURES MEMORIAL [facility];  Service: Gastroenterology   ??? PR ENDOSCOPIC ULTRASOUND EXAM N/A 02/27/2015    Procedure: UGI ENDO; W/ENDO ULTRASOUND EXAM INCLUDES ESOPHAGUS, STOMACH, &/OR DUODENUM/JEJUNUM;  Surgeon: Vonda Antigua, MD;  Location: GI PROCEDURES  MEM[facility]IAL [facility];  Service: Gastroenterology   ??? PR INSER HEART TEMP PACER ONE CHMBR N/A 10/02/2016    Procedure: Tempoarary Pacemaker Insertion;  Surgeon: Meredith Leeds, MD;  Location: [facility] EP;  Service: Cardiology   ??? PR LAP,DIAGNOSTIC ABDOMEN N/A 01/22/2016    Procedure: Laparoscopy, Abdomen, Peritoneum, & Omentum, Diagnostic, W/Wo Collection Specimen(S) By Brushing Or Washing;  Surgeon: Particia Nearing, MD;  Location: MAIN OR [facility];  Service: Transplant   ??? PR PLACE PERCUT GASTROSTOMY TUBE N/A 11/17/2016    Procedure: UGI ENDO; W/DIRECTED PLCMT PERQ GASTROSTOMY TUBE;  Surgeon: Cletis Athens, MD;  Location: GI PROCEDURES [facility]; Service: Gastroenterology   ??? PR TRACHEOSTOMY, PLANNED N/A 09/29/2016    Procedure: TRACHEOSTOMY PLANNED (SEPART PROC);  Surgeon: Katherina Mires, MD;  Location: MAIN OR [facility];  Service: Trauma   ??? PR TRANSCATH INSERT OR REPLACE LEADLESS PM VENTR N/A 10/11/2016    Procedure: Pacemaker Implant/Replace Leadless;  Surgeon: Meredith Leeds, MD;  Location: [facility] EP;  Service: Cardiology   ??? PR TRANSPLANT LIVER,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    Procedure: Liver Allotransplantation; Orthotopic, Partial Or Whole, From Cadaver Or Living Donor, Any Age;  Surgeon: Doyce Loose, MD;  Location: MAIN OR [facility];  Service: Transplant   ??? PR TRANSPLANT,PREP DONOR LIVER, WHOLE N/A 09/15/2016    Procedure: Rogelia Boga Std Prep Cad Donor Whole Liver Gft Prior Tnsplnt,Inc Chole,Diss/Rem Surr Tissu Wo Triseg/Lobe Splt;  Surgeon: Doyce Loose, MD;  Location: MAIN OR [facility];  Service: Transplant   ??? PR UPPER GI ENDOSCOPY,BIOPSY N/A 07/17/2012    Procedure: UGI ENDOSCOPY; WITH BIOPSY, SINGLE OR MULTIPLE;  Surgeon: Alba Destine, MD;  Location: GI PROCEDURES [facility];  Service: Gastroenterology   ??? PR UPPER GI ENDOSCOPY,DIAGNOSIS N/A 02/04/2014    Procedure: UGI ENDO, INCLUDE ESOPHAGUS, STOMACH, & DUODENUM &/OR JEJUNUM; DX W/WO COLLECTION SPECIMN, BY BRUSH OR WASH;  Surgeon: Wilburt Finlay, MD;  Location: GI PROCEDURES [facility];  Service: Gastroenterology   ??? PR UPPER GI ENDOSCOPY,LIGAT VARIX N/A 11/05/2013    Procedure: UGI ENDO; W/BAND LIG ESOPH &/OR GASTRIC VARICES;  Surgeon: Wilburt Finlay, MD;  Location: GI PROCEDURES MEMOR[facility];  Service: Gastroenterology     Family History   Problem Relation Age of Onset   ??? Hypertension Mother    ??? Cirrhosis Neg Hx    ??? Liver cancer Neg Hx      Prescriptions Prior to Admission   Medication Sig Dispense Refill Last Dose   ??? acetaminophen (TYLENOL) 650 mg/20.3 mL Soln 20.3 mL (650 mg total) by PEG Tube route every four (4) hours as needed.  0    ??? albuterol 2.5 mg/0.5 mL nebulizer solution Inhale 0.5 mL (2.5 mg total) by nebulization every six (6) hours as needed for wheezing. 30 each 12    ??? amiodarone (PACERONE) 200 MG tablet 1 tablet (200 mg total) by G-tube route daily. 30 tablet 11    ??? cycloSPORINE (NEORAL) 100 mg/mL microemulsion solution 2 mL (200 mg total) by G-tube route Two (2) times a day. 50 mL 12    ??? digoxin 62.5 mcg Tab 65 mcg by G-tube route daily.  0    ??? docusate (COLACE) 50 mg/5 mL liquid 10 mL (100 mg total) by G-tube route Two (2) times a day. 600 mL 0    ??? insulin NPH (HUMULIN,NOVOLIN) 100 unit/mL injection Inject 0.06 mL (6 Units total) under the skin Two (2) times a day (at 6am and 6pm). 3.6 mL 0    ???  insulin regular (HUMULIN,NOVOLIN) 100 unit/mL injection Inject 0-0.12 mL (0-12 Units total) under the skin every six (6) hours. 14.4 mL 0    ??? insulin regular (HUMULIN,NOVOLIN) 100 unit/mL injection Inject 0.04 mL (4 Units total) under the skin every six (6) hours. 4.8 mL 0    ??? melatonin 3 mg Tab 1 tablet (3 mg total) by PEG Tube route nightly.  0    ??? mycophenolate (CELLCEPT) 200 mg/mL suspension 2.5 mL (500 mg total) by G-tube route Two (2) times a day (8am and 8pm). 175 mL 12    ??? pantoprazole (PROTONIX) 40 mg GrPS 1 packet (40 mg total) by G-tube route daily. 30 packet 0    ??? polyethylene glycol (MIRALAX) 17 gram packet 17 g by G-tube route two (2) times a day as needed. 6 packet 0    ??? sildenafil, antihypertensive, (REVATIO) 10 mg/mL SusR oral suspension 3 mL (30 mg total) by PEG Tube route every eight (8) hours. 270 mL 0    ??? sulfamethoxazole-trimethoprim (BACTRIM,SEPTRA) 200-40 mg/5 mL suspension 10 mL by G-tube route 3 (three) times a week. 100 mL 0    ??? thiamine (B-1) 100 MG tablet 1 tablet (100 mg total) by G-tube route daily. 30 tablet 11    ??? traMADol (ULTRAM) 50 mg tablet 1 tablet (50 mg total) by G-tube route every six (6) hours as needed. for up to 5 days  0    ??? valGANciclovir (VALCYTE) 50 mg/mL SolR 9 mL (450 mg total) by PEG Tube route daily.  0    ??? warfarin (COUMADIN) 3 MG tablet 1 tablet (3 mg total) by G-tube route daily with evening meal.  0      No Known Allergies  Social History   Substance Use Topics   ??? Smoking status: Never Smoker   ??? Smokeless tobacco: Never Used      Comment: Smoked in high school for about 5 years.    ??? Alcohol use No     Objective:      Physical Exam:  Temp:  [36.2 ??C-36.3 ??C] 36.2 ??C  Heart Rate:  [73-80] 80  Resp:  [17] 17  BP: (148-150)/(68-78) 148/78  SpO2:  [96 %-98 %] 98 %    General appearance: mild distress and chronically ill appearing.  Eyes: Anicteric sclera. No erythema.  ENT: No oral ulcers. Posterior oropharynx unremarkable.  Cardiovascular: RRR without murmurs, heaves, or thrills. No lower extremity edema.  Pulmonary: Normal work of breathing. No adventitious sounds.  Abdominal: Soft and moderately tender, well-healed surgical scars  Musculoskeletal: No temporal wasting. Normal joints of the hand.  Skin: No jaundice. No rashes.  Neurologic: No focal deficits. No asterixis.  Psychiatric: Thought organized. Appropriate affect.    Diagnostic Studies:    Labs:      Lab Results   Component Value Date    HEPAIGG Reactive (A) 11/24/2015    HEPBCAB Reactive (A) 09/14/2016    HEPBCAB Reactive (A) 09/05/2016    HEPBCAB Reactive (A) 11/24/2015    HEPBCAB POSITIVE (AB) 06/02/2008    HEPBCAB POSITIVE (AB) 12/29/2004    HEPCAB Reactive (A) 09/21/2016    HEPCAB Reactive (A) 09/14/2016    HEPCAB Reactive (A) 09/05/2016    HEPCAB Reactive (A) 11/24/2015    HEPCAB see below 06/02/2008    HIVAGAB Nonreactive 09/21/2016    HIVAGAB Nonreactive 09/14/2016    HIVAGAB Nonreactive 09/05/2016    HIVAGAB Nonreactive 11/24/2015    HIVAGAB Nonreactive 05/17/2012    ANA Positive (A)  11/24/2015    ANA NEGATIVE 06/02/2008    ANA NEGATIVE 03/23/2005    SMOOTHMUSCAB Negative 11/24/2015    SMOOTHMUSCAB Negative 06/02/2008    MITOAB Negative 11/24/2015    ANTIMLEV 2.1 11/24/2015       Imaging:   US Liver transplant - 11/25/2016 - Patent hepatic transplant vasculature. 2.Stable resistive indices in the hepatic transplant arteries, within normal limits. 3.Mild hepatomegaly. Biliary ductal dilatation, slightly increased to unchanged from prior    Microbiology:  Lab Results   Component Value Date    LABBLOO No Growth at 5 days 10/13/2016    LABBLOO No Growth at 5 days 10/13/2016     No results found for: ASCPR, TOTCELLCT, FNEUT  Lab Results   Component Value Date    LABURIN Mixed Urogenital Flora 09/05/2016    LABURIN  04/03/2013     MIXED UROGENITAL FLORA  Reference Range: (Lower detectable limit 1000 cfu/ml)  (Lower detectable limit for Acute dysuria, Suprapubic tap,  and Post-prostatic massage specimen types = 100 cfu/ml)    WBCUA 2 (H) 11/02/2016       GI Procedures:   Esophagogastroduodenoscopy (EGD) - 11/17/16 - LA Grade B erosive esophagitis. - Normal examined duodenum. - An externally removable PEG placement was successfully completed. - No specimens collected.    Colonoscopy and Endoscopic ultrasound (EUS) - 02/27/15 - Normal upper endoscopy with no esophagal or gastric varices noted. - There was no sign of significant pathology in the pancreatic head and in the pancreatic body. - There was abnormal echogenicity in the entire examined liver. This was lobulated. The diagnosis is cirrhosis. A discrete mass or lesion in the liver was not seen, probably as segment VIII was not visualized. - Ascites was found on endosonographic examination of the peritoneal cavity.    Esophagogastroduodenoscopy (EGD) - 02/04/2014 - Normal esophagus. No esophageal varices. - Portal hypertensive gastropathy. No gastric varices. - Normal examined duodenum. - No specimens collected.    Esophagogastroduodenoscopy (EGD) - 11/05/13 - Grade II esophageal varices. Completely eradicated. Banded. - Portal hypertensive gastropathy. - Normal examined duodenum. - No specimens collected.

## 2016-11-26 NOTE — Unmapped (Signed)
[facility] Physical Medicine and Rehabilitation  Individualized Overall Plan of Care  11/26/2016 10:45 AM     Patient Name: Jeffrey Ward   Medical Record Number: [id number]   Date of Birth: [birth year]   Sex: Male   Room/Bed: 7306/7306-01     Primary Impairment Group:   Debility:  16  Debility (Non-cardiac/Non-pulmonary)  Admit Date/Time: 11/24/2016  9:09 PM     Physician Summary   [name] will undergo inpatient rehabilitation to manage complex medical and rehabilitation needs related to debility after liver transplant (09/15/16).    The patient will receive interdisciplinary care, including daily physician management for the following:   Pain, Wound care, Monitoring for medication side effects, Nutrition and Infection treatment     The patient will benefit from continued services by:   Physical Therapy, Occupational Therapy and Recreational Therapy     Rehab nursing is required to help manage the patient???s complex nursing needs related to their documented medical conditions.     The patient's medical prognosis is Good to achieve the stated goals below and to be able to be discharged home with family assistance or supervision within the estimated length of stay of 14-21 days.     Goals     Patient / Family Goals   ADL: To be walking and talking by the time I go home.   Mobility: I want to get home  Cognition / Communication: Return to PO diet  Community Reintegration:      FIM Discharge Goals           Grooming - Goal: 6   Bathing - Goal: 6   Dressing Upper Body - Goal: 6   Dressing Lower Body - Goal: 6  Transfers (Bed Chair WC) Goal: 5   Transfers (Toilet) Goal: 6   Transfers (Tub Shower) Goal: 6  Locomotion (Walk) Goal: 5   Locomotion (Wheelchair) Goal: 6   Locomotion (Stairs) Goal: 2                                        PT DME Recommendations:  (TBD)  PT Follow-up / Frequency: 1-2 hours per day,  for: 5-7 days per week   Planned Interventions: Balance activities, Education - Patient, Education - Family / caregiver, Endurance activities, Functional mobility, Home exercise program, Neuromuscular re-education, Postural re-education, Self-care / Home training, Therapeutic exercise, Therapeutic activity, Transfer training    OT Follow-up / Frequency: 1-2 hours per day  for: 5-7 days per week  Planned Treatment Duration: 14 days  Planned Interventions: Adaptive equipment, ADL retraining, Balance activities, Bed mobility, Compensatory tech. training, Conservation, Education - Patient, Education - Family / caregiver, Endurance activities, Functional cognition, Functional mobility, Home exercise program, Modalities, Safety education, Range of motion, Positioning, Postular / Proximal stability, Therapeutic exercise, UE Strength / coordination exercise, Transfer training  OT DME Recommendations: [employer] copy[employer]    SLP Follow-up / Frequency: 1-2x per day-ST will utilize individual and group tx sessions as needed to target improved cognitive-linguistic function and social communication  For: 3-5x week  Planned Treatment Duration : AIR stay pending ongoing progress toward goals     Barriers to Discharge: Functional strength deficits

## 2016-11-26 NOTE — Unmapped (Signed)
Transplant Surgery Progress Note    Hospital Day: 3    Assessment:     Jeffrey Ward??is a 66 y.o.??male??with a history of IDDM, GERD, prior perioperative a-fib with RVR and 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 Post-op course complicated by decompensation secondary to acute right heart failure, AKI requiring CRRT (resolved), severe pulmonary hypertension responsive to sildenafil, bradycardia/ asystole requiring pacemaker placement, atrial fibrilliation/ narrow complex tachycardia requiring anti-coagulation with warfarin and amiodarone/ digoxin, malnutrition/ debility, and altered mental status/ delirium improved with transition to cyclosporine and psychiatric medications. On 11/03/16 he had a significant spike in his LFTs s/p biopsy 8/24 negative for rejection. He continued to recover and was transferred to floor status 8/28. The patient was transferred back to the SICU 9/9 2/2 GI bleed following PEG placement. Once resuscitated and stabilized, he was transferred back to the floor 9/10. He was evaluated by AIR 9/13 and accepted for admission.     LFTs were elevated on 9/14 and downtrending today. Liver transplant U/S performed 9/14- preliminary read with increased dilation of CBD. GI has been consulted for ERCP.       Plan:     -- Liver transplant [place] (9/14)- in process  -- Continue daily Chem 10, LFTs, CBC, coags  -- Plan for tentative ERCP with GI procedures on Monday  -- Continue to monitor Cyclosporine and adjust according to trough     -- Transplant surgery will continue to follow daily    Subjective/Interval Events:   No acute events overnight.     Objective:        Vital Signs:  BP 171/77  - Pulse 84  - Temp 35.8 ??C (Oral)  - Resp 20  - Wt 68.4 kg (150 lb 12.7 oz)  - SpO2 100%  - BMI 27.75 kg/m??     Input/Output:  I/O last 3 completed shifts:  In: 740 [NG/GT:740]  Out: 1050 [Urine:1050]    Physical Exam:  General: No acute distress, resting slumped to side  Neuro: Awake and interactive  Lungs: Normal work of breathing on room air, decannulation site healing well    Abdomen: Non-distended    Lab Results   Component Value Date    WBC 3.0 (L) 11/24/2016    HGB 8.7 (L) 11/24/2016    HCT 25.4 (L) 11/24/2016    PLT 95 (L) 11/24/2016       Lab Results   Component Value Date    NA 137 11/26/2016    K 3.8 11/26/2016    CL 101 11/26/2016    CO2 33.0 (H) 11/26/2016    BUN 12 11/26/2016    CREATININE 0.86 11/26/2016    CALCIUM 8.8 11/26/2016    MG 2.0 11/24/2016    PHOS 3.2 11/24/2016     Lab Results   Component Value Date    ALKPHOS 395 (H) 11/26/2016    BILITOT 1.6 (H) 11/26/2016    BILIDIR 1.20 (H) 11/24/2016    PROT 5.0 (L) 11/26/2016    ALBUMIN 2.8 (L) 11/26/2016    ALT 249 (H) 11/26/2016    AST 129 (H) 11/26/2016    GGT 261 (H) 11/24/2016       Imaging:  Liver Transplant [place] in process

## 2016-11-27 LAB — COMPREHENSIVE METABOLIC PANEL
ALBUMIN: 3 g/dL — ABNORMAL LOW (ref 3.5–5.0)
ALKALINE PHOSPHATASE: 370 U/L — ABNORMAL HIGH (ref 38–126)
ALT (SGPT): 180 U/L — ABNORMAL HIGH (ref 19–72)
ANION GAP: 8 mmol/L — ABNORMAL LOW (ref 9–15)
AST (SGOT): 57 U/L — ABNORMAL HIGH (ref 19–55)
BILIRUBIN TOTAL: 1.6 mg/dL — ABNORMAL HIGH (ref 0.0–1.2)
BLOOD UREA NITROGEN: 17 mg/dL (ref 7–21)
BUN / CREAT RATIO: 18
CALCIUM: 9 mg/dL (ref 8.5–10.2)
CO2: 33 mmol/L — ABNORMAL HIGH (ref 22.0–30.0)
CREATININE: 0.96 mg/dL (ref 0.70–1.30)
EGFR MDRD AF AMER: >=60 mL/min/{1.73_m2} (ref >=60)
EGFR MDRD NON AF AMER: >=60 mL/min/{1.73_m2} (ref >=60)
GLUCOSE RANDOM: 198 mg/dL — ABNORMAL HIGH (ref 65–179)
POTASSIUM: 4.1 mmol/L (ref 3.5–5.0)
PROTEIN TOTAL: 5.5 g/dL — ABNORMAL LOW (ref 6.5–8.3)

## 2016-11-27 LAB — PROTIME-INR: INR: 2.23

## 2016-11-27 LAB — PROTIME: Lab: 25.4 — ABNORMAL HIGH

## 2016-11-27 LAB — BLOOD UREA NITROGEN: Urea nitrogen:MCnc:Pt:Ser/Plas:Qn:: 17

## 2016-11-27 NOTE — Unmapped (Signed)
Problem: Patient Care Overview  Goal: Plan of Care Review  Outcome: Progressing   11/25/16 1138   OTHER   Plan of Care Reviewed With patient;spouse   Plan of Care Review   Progress improving   Pt is A & O x 3, quiet voice due to recently decannulation trach, continent of B & B, denied pain, tube feeding given and pt was able to tolerate all feeds. No c/o nausea. PT reported that patient was diaphoretic during therapy with supine VS P=91, 179/73, SPO2 100% at 2L via n/c. VS after lying down 35.9, 85, 20, 158/75, 100% at 2L via n/c. Pt remained stable after the incident, no distress noted, tolerated all meds and feeding  via peg tube. Fall and skin care precautions maintained, call bell in reach, will continue to monitor.    Problem: Self-Care Deficit (Adult,Obstetrics,Pediatric)  Goal: Identify Related Risk Factors and Signs and Symptoms  Related risk factors and signs and symptoms are identified upon initiation of Human Response Clinical Practice Guideline (CPG).   Outcome: Progressing   11/25/16 1138   Self-Care Deficit (Adult,Obstetrics,Pediatric)   Related Risk Factors (Self-Care Deficit) activity intolerance;disease process;medication effects;surgery;pain/discomfort   Signs and Symptoms (Self-Care Deficit) decreased balance;gait unstable;weakness, paresis, paralysis       Problem: Fall Risk (Adult)  Goal: Identify Related Risk Factors and Signs and Symptoms  Related risk factors and signs and symptoms are identified upon initiation of Human Response Clinical Practice Guideline (CPG).   Outcome: Progressing   11/25/16 1138   Fall Risk (Adult)   Related Risk Factors (Fall Risk) fatigue/slow reaction;gait/mobility problems;polypharmacy;objects hard to reach   Signs and Symptoms (Fall Risk) presence of risk factors       Problem: Activity Intolerance (Adult)  Goal: Identify Related Risk Factors and Signs and Symptoms  Related risk factors and signs and symptoms are identified upon initiation of Human Response Clinical Practice Guideline (CPG).   Outcome: Progressing   11/25/16 1138   Activity Intolerance (Adult)   Related Risk Factors (Activity Intolerance) generalized weakness;pain;medication side effects   Signs and Symptoms (Activity Intolerance) pain/discomfort;nausea       Problem: Nutrition, Imbalanced: Inadequate Oral Intake (Adult)  Goal: Identify Related Risk Factors and Signs and Symptoms  Related risk factors and signs and symptoms are identified upon initiation of Human Response Clinical Practice Guideline (CPG).   Outcome: Progressing   11/26/16 1715   Nutrition, Imbalanced: Inadequate Oral Intake (Adult)   Related Risk Factors (Nutrition Imbalance, Inadequate Oral Intake) NPO status prolonged   Signs and Symptoms (Nutrition Imbalance, Inadequate Oral Intake: Signs and Symptoms) weakness/lethargy       Problem: Infection, Risk/Actual (Adult)  Goal: Identify Related Risk Factors and Signs and Symptoms  Related risk factors and signs and symptoms are identified upon initiation of Human Response Clinical Practice Guideline (CPG).   Outcome: Progressing   11/25/16 1138   Infection, Risk/Actual (Adult)   Related Risk Factors (Infection, Risk/Actual) prolonged hospitalization;medication effects;surgery/procedure   Signs and Symptoms (Infection, Risk/Actual) feeding/eating intolerance;vomiting;weakness;pain       Problem: Skin Injury Risk (Adult)  Goal: Identify Related Risk Factors and Signs and Symptoms  Related risk factors and signs and symptoms are identified upon initiation of Human Response Clinical Practice Guideline (CPG).   Outcome: Progressing   11/25/16 1138   Skin Injury Risk (Adult)   Related Risk Factors (Skin Injury Risk) cognitive impairment;hospitalization prolonged;mobility impaired

## 2016-11-27 NOTE — Unmapped (Signed)
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 Post-op course complicated by decompensation secondary to acute right heart failure, AKI requiring CRRT (resolved), severe pulmonary hypertension responsive to sildenafil, bradycardia/ asystole requiring pacemaker placement, atrial fibrilliation/ narrow complex tachycardia requiring anti-coagulation with warfarin and amiodarone/ digoxin, malnutrition/ debility, and altered mental status/ delirium improved with transition to cyclosporine and psychiatric medications. On 11/03/16 he had a significant spike in his LFTs s/p biopsy 8/24 negative for rejection. He continued to recover and was transferred to floor status 8/28. The patient was transferred back to the SICU 9/9 2/2 GI bleed following PEG placement. Once resuscitated and stabilized, he was transferred back to the floor 9/10. He was evaluated by AIR 9/13 and accepted for admission.     LFTs were elevated on 9/14 and downtrending today. Liver transplant U/S performed 9/14- RIs stable, vessels patent, mild increased dilation of CBD. GI has been consulted for ERCP.       Plan:     -- Continue daily Chem 10, LFTs, CBC, coags  -- OK to hold off on ERCP at this time since LFTs are improving. We have contacted GI biliary team  -- Continue to monitor Cyclosporine and adjust according to trough     -- Transplant surgery will continue to follow daily    Subjective/Interval Events:   No acute events overnight. LFTs downtrending.    Objective:        Vital Signs:  BP 149/70  - Pulse 86  - Temp 35.7 ??C (Oral)  - Resp 20  - Wt 68.4 kg (150 lb 12.7 oz)  - SpO2 100%  - BMI 27.75 kg/m??     Input/Output:  I/O last 3 completed shifts:  In: 840 [NG/GT:840]  Out: 700 [Urine:700]    Physical Exam:  General: No acute distress, resting slumped to side  Neuro: Awake and interactive  Lungs: Normal work of breathing on room air, decannulation site healing well    Abdomen: Non-distended    Lab Results   Component Value Date    WBC 3.0 (L) 11/24/2016    HGB 8.7 (L) 11/24/2016    HCT 25.4 (L) 11/24/2016    PLT 95 (L) 11/24/2016       Lab Results   Component Value Date    NA 139 11/27/2016    K 4.1 11/27/2016    CL 98 11/27/2016    CO2 33.0 (H) 11/27/2016    BUN 17 11/27/2016    CREATININE 0.96 11/27/2016    CALCIUM 9.0 11/27/2016    MG 2.0 11/24/2016    PHOS 3.2 11/24/2016     Lab Results   Component Value Date    ALKPHOS 370 (H) 11/27/2016    BILITOT 1.6 (H) 11/27/2016    BILIDIR 1.20 (H) 11/24/2016    PROT 5.5 (L) 11/27/2016    ALBUMIN 3.0 (L) 11/27/2016    ALT 180 (H) 11/27/2016    AST 57 (H) 11/27/2016    GGT 261 (H) 11/24/2016       Imaging:  No new imaging

## 2016-11-27 NOTE — Unmapped (Addendum)
Problem: Patient Care Overview  Goal: Plan of Care Review  Outcome: Progressing  Patient alert and oriented x4, NPO, receiving TF per orders, tolerated well no occurrence of N/V. TF amt increased 30 ml, now receiving 210 ml /hr Patient goal is 237 ml/hr. Administered all medications via Tube.  Patient independent with bed mobility, skin dry and intact. Fall, aspiration precautions maintained, will continue to monitor.     Problem: Fall Risk (Adult)  Goal: Identify Related Risk Factors and Signs and Symptoms  Related risk factors and signs and symptoms are identified upon initiation of Human Response Clinical Practice Guideline (CPG).   Outcome: Progressing      Problem: Nutrition, Imbalanced: Inadequate Oral Intake (Adult)  Goal: Identify Related Risk Factors and Signs and Symptoms  Related risk factors and signs and symptoms are identified upon initiation of Human Response Clinical Practice Guideline (CPG).   Outcome: Progressing      Problem: Infection, Risk/Actual (Adult)  Goal: Identify Related Risk Factors and Signs and Symptoms  Related risk factors and signs and symptoms are identified upon initiation of Human Response Clinical Practice Guideline (CPG).   Outcome: Progressing      Problem: Skin Injury Risk (Adult)  Goal: Skin Health and Integrity  Patient will demonstrate the desired outcomes by discharge/transition of care.   Outcome: Progressing

## 2016-11-27 NOTE — Unmapped (Signed)
Physical Medicine and Rehabilitation  Daily Progress Note [facility]    ASSESSMENT:     ??Gladys Spoelstra??is a 66 y.o.??male??admitted to [facility] for physical rehabilitation s/p liver transplant (09/15/16).  ??  Primary Rehab diagnosis: (Debility) 16 Debility (Non-Cardiac/Non-Pulmonary)    PLAN:     REHAB:   - PT and OT to maximize functional status with mobility and ADLs as well as prevention of joint contracture.   - SLP for cognitive and swallow function.  - RT for community re-integration, education, and leisure support services.  - Pharmacy consult for patient and family education on medication management.   - To be discussed in weekly Interdisciplinary Team Conference.  ??  S/p Liver transplant (09/15/16) For chronic hep C and HCC: Liver ultrasound on 11/03/16 showing mild to moderate intrahepatic biliary duct dilation which appeared to be new from prior ultrasound. Liver biopsy on 11/04/16 did not show acute rejection. Transplant will continue following closely while in acute inpatient rehabilitation. The patient had significantly worsening LFTs on 11/25/16 and are trending down as of 9/15  - f/u transplant recs  - Continue cyclosporine, CellCept  - continue Valcyte, Bactrim for ppx  ??  GIB: Patient had GI bleed from an ulcer which was clipped on 11/20/16 by gastroenterology.   - Protonix 40 mg BID  ??  Paroxismal A-fib: Patient on digoxin, amiodarone, warfarin for a-fib. Patient recently had GI bleed and warfarin was restarted on 9/11. Most recent INR was 1.69 on 11/24/16. Goal INR is 2-3 per Dr. Azucena Cecil. Per Dr. Azucena Cecil the decision was made between transplant and GI that the patient did not need to be on heparin due to bleeding risk and does not need prophylaxis while going back onto warfarin.  - Continue amiodarone, digoxin, warfarin  - follow up cardiology as outpatient  ??  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  ??  Right Heart Failure. Stable: Patient reportedly had right heart failure postop after transplant.   ??  Severe PAH: The patient's wife reports that he was told that he had pulmonary hypertension prior to admission to hospital. 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  DM:   - Endocrine following, follow-up recommendations  ??  Delirium: Patient was on Zyprexa 7.5 mg nightly which is being held due to prolonged QTc. Patient still has when necessary Zyprexa on. Psychiatry has been following and recommends twice weekly EKGs and repleting magnesium to between 2 and 4. The patient's delirium is reportedly improving prior to admission to acute inpatient rehabilitation  - Replete magnesium to between 2 and 4  - Hold Zyprexa  - EKGs weekly  ??  Dysphonia with dysphagia: The patient has been intubated multiple times throughout his hospitalization. Voice has been hoarse/hypophonic since intubations. Will consider ENT referral if it does not improve within a few days of admission to acute inpatient rehabilitation.  - currently on tube feeds  - consider ENT consult  ??  Nausea/Vomiting: Patient had nausea and vomiting with tube feeds as recently as the morning of 11/24/16. This was thought to be due to high residuals. Tube feed rate was slowed. We'll continue to monitor for symptoms.  ??  Distal LE weakness/dysesthesias: On AIR admission, patient with left lower extremity weakness and numbness on the dorsal aspect of the left foot since surgery. He reported that he has developed some numbness to the right great toe. The patient developed a spreading dysesthesias of his legs  to the mid tibial area bilaterally the day following admission to rehabilitation. 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  - consulted neuro, appreciate recommendations - MRI lumbar spine wo contrast to evaluate for possible lumosacral radiculopathy     DISPO: Patient to be discussed at weekly interdisciplinary team conference.   - ELOS: TBD.     SUBJECTIVE:     NAEON. Nausea improved. Slept well last night. No complaints or requests this morning.     OBJECTIVE:     Vital signs (last 24 hours):  Temp:  [35.7 ??C-36.3 ??C] 35.7 ??C  Heart Rate:  [86-97] 86  Resp:  [17-20] 20  BP: (149)/(70) 149/70  SpO2:  [100 %] 100 %    Intake/Output (last 3 shifts):  I/O last 3 completed shifts:  In: 840 [NG/GT:840]  Out: 700 [Urine:700]    Physical Exam:  GEN: NAD laying in bed  EYES: EOMI  HENT: Hearing adequate for conversation, soft hoarse voice  RESP: NWOB on 2L [place]  ABD: soft, NTND  SKIN: no visible rashes   MSK: no notable contractures  NEURO:  ????????????????????????Mental Status: A&Ox3, follows commands well and answers questions appropriately  ????????????????????????Tone: within normal limits, no spasticity noted  PSYCH: mood euthymic, affect appropriate

## 2016-11-27 NOTE — Unmapped (Signed)
Problem: Patient Care Overview  Goal: Plan of Care Review  Outcome: Progressing   11/25/16 1138   OTHER   Plan of Care Reviewed With patient;spouse   Plan of Care Review   Progress improving   Patient is A&O. Patient is continent of bowel and bladder, last BM was 11/27/16. Regular diet. Pain relieved with PRN meds (0621 night nurse). Safety and skin protocol maintained. Bed in lowest position, bed alarm activated, call bell within reach, will continue to monitor.     Goal: Individualization and Mutuality  Outcome: Progressing    Goal: Discharge Needs Assessment  Outcome: Progressing   11/25/16 0950 11/25/16 1209   OTHER   Equipment Currently Used at Home --  cane, straight;glucometer  (Used a cane occasionally. )   Patient and/or family were provided with choice of facilities / services that are available and appropriate to meet post hospital care needs? --  N/A   Discharge Needs Assessment   Concerns to be Addressed --  adjustment to diagnosis/illness;care coordination/care conferences;discharge planning   Readmission Within the Last 30 Days --  planned readmission  (Acute to AIR admission)   Transportation Anticipated family or friend will provide --    Anticipated Changes Related to Illness --  inability to care for self   Equipment Needed After Discharge --  other (see comments)  (Possibly oxygen, will try to wean patient. TBD regarding other HME)   Discharge Facility/Level of Care Needs --  other (see comments)  (TBD)     Goal: Interprofessional Rounds/Family Conf  Outcome: Progressing   11/25/16 1209   OTHER   Clinical EDD (Estimated Discharge Date)  (Will team on Tuesday)       Problem: Self-Care Deficit (Adult,Obstetrics,Pediatric)  Goal: Identify Related Risk Factors and Signs and Symptoms  Related risk factors and signs and symptoms are identified upon initiation of Human Response Clinical Practice Guideline (CPG).   Outcome: Progressing   11/25/16 1138   Self-Care Deficit (Adult,Obstetrics,Pediatric) Related Risk Factors (Self-Care Deficit) activity intolerance;disease process;medication effects;surgery;pain/discomfort   Signs and Symptoms (Self-Care Deficit) decreased balance;gait unstable;weakness, paresis, paralysis     Goal: Improved Ability to Perform BADL and IADL  Patient will demonstrate the desired outcomes by discharge/transition of care.   Outcome: Progressing   11/27/16 1311   Self-Care Deficit (Adult,Obstetrics,Pediatric)   Improved Ability to Perform BADL and IADL making progress toward outcome       Problem: Fall Risk (Adult)  Goal: Identify Related Risk Factors and Signs and Symptoms  Related risk factors and signs and symptoms are identified upon initiation of Human Response Clinical Practice Guideline (CPG).   Outcome: Progressing   11/25/16 1138   Fall Risk (Adult)   Related Risk Factors (Fall Risk) fatigue/slow reaction;gait/mobility problems;polypharmacy;objects hard to reach   Signs and Symptoms (Fall Risk) presence of risk factors     Goal: Absence of Fall  Patient will demonstrate the desired outcomes by discharge/transition of care.   Outcome: Progressing   11/27/16 1311   Fall Risk (Adult)   Absence of Fall making progress toward outcome       Problem: Activity Intolerance (Adult)  Goal: Identify Related Risk Factors and Signs and Symptoms  Related risk factors and signs and symptoms are identified upon initiation of Human Response Clinical Practice Guideline (CPG).   Outcome: Progressing   11/25/16 1138   Activity Intolerance (Adult)   Related Risk Factors (Activity Intolerance) generalized weakness;pain;medication side effects   Signs and Symptoms (Activity Intolerance) pain/discomfort;nausea  Goal: Activity Tolerance  Patient will demonstrate the desired outcomes by discharge/transition of care.   Outcome: Progressing   11/27/16 1311   Activity Intolerance (Adult)   Activity Tolerance making progress toward outcome     Goal: Effective Energy Conservation Techniques  Patient will demonstrate the desired outcomes by discharge/transition of care.   Outcome: Progressing   11/27/16 1311   Activity Intolerance (Adult)   Effective Energy Conservation Techniques making progress toward outcome       Problem: Nutrition, Imbalanced: Inadequate Oral Intake (Adult)  Goal: Identify Related Risk Factors and Signs and Symptoms  Related risk factors and signs and symptoms are identified upon initiation of Human Response Clinical Practice Guideline (CPG).   Outcome: Progressing   11/26/16 1715   Nutrition, Imbalanced: Inadequate Oral Intake (Adult)   Related Risk Factors (Nutrition Imbalance, Inadequate Oral Intake) NPO status prolonged   Signs and Symptoms (Nutrition Imbalance, Inadequate Oral Intake: Signs and Symptoms) weakness/lethargy     Goal: Improved Oral Intake  Patient will demonstrate the desired outcomes by discharge/transition of care.   Outcome: Progressing   11/27/16 1311   Nutrition, Imbalanced: Inadequate Oral Intake (Adult)   Improved Oral Intake making progress toward outcome     Goal: Prevent Further Weight Loss  Patient will demonstrate the desired outcomes by discharge/transition of care.   Outcome: Progressing   11/27/16 1311   Nutrition, Imbalanced: Inadequate Oral Intake (Adult)   Prevent Further Weight Loss making progress toward outcome       Problem: Infection, Risk/Actual (Adult)  Goal: Identify Related Risk Factors and Signs and Symptoms  Related risk factors and signs and symptoms are identified upon initiation of Human Response Clinical Practice Guideline (CPG).   Outcome: Progressing   11/25/16 1138   Infection, Risk/Actual (Adult)   Related Risk Factors (Infection, Risk/Actual) prolonged hospitalization;medication effects;surgery/procedure   Signs and Symptoms (Infection, Risk/Actual) feeding/eating intolerance;vomiting;weakness;pain     Goal: Infection Prevention/Resolution  Patient will demonstrate the desired outcomes by discharge/transition of care.   Outcome: Progressing   11/27/16 1311   Infection, Risk/Actual (Adult)   Infection Prevention/Resolution making progress toward outcome       Problem: Skin Injury Risk (Adult)  Goal: Identify Related Risk Factors and Signs and Symptoms  Related risk factors and signs and symptoms are identified upon initiation of Human Response Clinical Practice Guideline (CPG).   Outcome: Progressing   11/25/16 1138   Skin Injury Risk (Adult)   Related Risk Factors (Skin Injury Risk) cognitive impairment;hospitalization prolonged;mobility impaired     Goal: Skin Health and Integrity  Patient will demonstrate the desired outcomes by discharge/transition of care.   Outcome: Progressing   11/27/16 1311   Skin Injury Risk (Adult)   Skin Health and Integrity making progress toward outcome       Problem: VTE, DVT and PE (Adult)  Goal: Signs and Symptoms of Listed Potential Problems Will be Absent, Minimized or Managed (VTE, DVT and PE)  Signs and symptoms of listed potential problems will be absent, minimized or managed by discharge/transition of care (reference VTE, DVT and PE (Adult) CPG).   Outcome: Progressing   11/27/16 1311   VTE, DVT and PE (Adult)   Problems Assessed (VTE, DVT, PE) all   Problems Present (VTE, DVT, PE) none

## 2016-11-28 LAB — CYCLOSPORINE (FPIA) BLOOD: Lab: 517

## 2016-11-28 LAB — CBC
HEMATOCRIT: 26.1 % — ABNORMAL LOW (ref 41.0–53.0)
MEAN CORPUSCULAR HEMOGLOBIN CONC: 34.5 g/dL (ref 31.0–37.0)
MEAN CORPUSCULAR HEMOGLOBIN: 32.3 pg (ref 26.0–34.0)
MEAN CORPUSCULAR VOLUME: 93.4 fL (ref 80.0–100.0)
MEAN PLATELET VOLUME: 9.3 fL (ref 7.0–10.0)
PLATELET COUNT: 117 10*9/L — ABNORMAL LOW (ref 150–440)
RED BLOOD CELL COUNT: 2.8 10*12/L — ABNORMAL LOW (ref 4.50–5.90)
RED CELL DISTRIBUTION WIDTH: 21.6 % — ABNORMAL HIGH (ref 12.0–15.0)
WBC ADJUSTED: 1.2 10*9/L — ABNORMAL LOW (ref 4.5–11.0)

## 2016-11-28 LAB — COMPREHENSIVE METABOLIC PANEL
ALKALINE PHOSPHATASE: 621 U/L — ABNORMAL HIGH (ref 38–126)
ALT (SGPT): 297 U/L — ABNORMAL HIGH (ref 19–72)
ANION GAP: 4 mmol/L — ABNORMAL LOW (ref 9–15)
AST (SGOT): 282 U/L — ABNORMAL HIGH (ref 19–55)
BILIRUBIN TOTAL: 3.2 mg/dL — ABNORMAL HIGH (ref 0.0–1.2)
BLOOD UREA NITROGEN: 18 mg/dL (ref 7–21)
BUN / CREAT RATIO: 20
CALCIUM: 8.8 mg/dL (ref 8.5–10.2)
CHLORIDE: 97 mmol/L — ABNORMAL LOW (ref 98–107)
CO2: 35 mmol/L — ABNORMAL HIGH (ref 22.0–30.0)
CREATININE: 0.9 mg/dL (ref 0.70–1.30)
EGFR MDRD AF AMER: >=60 mL/min/{1.73_m2} (ref >=60)
EGFR MDRD NON AF AMER: >=60 mL/min/{1.73_m2} (ref >=60)
GLUCOSE RANDOM: 162 mg/dL (ref 65–179)
POTASSIUM: 3.9 mmol/L (ref 3.5–5.0)
PROTEIN TOTAL: 5.1 g/dL — ABNORMAL LOW (ref 6.5–8.3)
SODIUM: 136 mmol/L (ref 135–145)

## 2016-11-28 LAB — MEAN CORPUSCULAR HEMOGLOBIN: Lab: 32.3

## 2016-11-28 LAB — PREALBUMIN: Prealbumin:MCnc:Pt:Ser/Plas:Qn:: 14.9 — ABNORMAL LOW

## 2016-11-28 LAB — GLUCOSE RANDOM: Glucose:MCnc:Pt:Ser/Plas:Qn:: 162

## 2016-11-28 LAB — CMV DNA, QUANTITATIVE, PCR

## 2016-11-28 LAB — INR: Lab: 1.91

## 2016-11-28 LAB — CMV QUANT: Lab: 0

## 2016-11-28 NOTE — Unmapped (Signed)
Problem: Patient Care Overview  Goal: Plan of Care Review  Outcome: Progressing  Patient alert and oriented x3, NPO, 0600 TF held for ERCP, regular insulin held, OK to give AM NPH per orders.  C/o 8/10 back pain relieved with PRN tramadol 50mg . Continent to bowel and bladder. Aspiration and fall precautions maintained.              Problem: Fall Risk (Adult)  Goal: Absence of Fall  Patient will demonstrate the desired outcomes by discharge/transition of care.   Outcome: Progressing      Problem: Infection, Risk/Actual (Adult)  Goal: Infection Prevention/Resolution  Patient will demonstrate the desired outcomes by discharge/transition of care.   Outcome: Progressing      Problem: Skin Injury Risk (Adult)  Goal: Skin Health and Integrity  Patient will demonstrate the desired outcomes by discharge/transition of care.   Outcome: Progressing      Problem: VTE, DVT and PE (Adult)  Goal: Signs and Symptoms of Listed Potential Problems Will be Absent, Minimized or Managed (VTE, DVT and PE)  Signs and symptoms of listed potential problems will be absent, minimized or managed by discharge/transition of care (reference VTE, DVT and PE (Adult) CPG).   Outcome: Progressing

## 2016-11-28 NOTE — Unmapped (Signed)
Problem: Patient Care Overview  Goal: Plan of Care Review  Outcome: Progressing   11/25/16 1138   OTHER   Plan of Care Reviewed With patient;spouse   Plan of Care Review   Progress improving   Patient is A&O. Patient is continent of bowel and bladder, last BM was 11/27/16. Regular diet. Patient denied pain during shift. Patient has barium swallow test and has order for ERCP. Safety and skin protocol maintained. Bed alarm on. Bed in lowest position, call bell within reach, will continue to monitor.     Goal: Discharge Needs Assessment  Outcome: Progressing   11/25/16 0950 11/25/16 1209   OTHER   Equipment Currently Used at Home --  cane, straight;glucometer  (Used a cane occasionally. )   Patient and/or family were provided with choice of facilities / services that are available and appropriate to meet post hospital care needs? --  N/A   Discharge Needs Assessment   Concerns to be Addressed --  adjustment to diagnosis/illness;care coordination/care conferences;discharge planning   Readmission Within the Last 30 Days --  planned readmission  (Acute to AIR admission)   Transportation Anticipated family or friend will provide --    Anticipated Changes Related to Illness --  inability to care for self   Equipment Needed After Discharge --  other (see comments)  (Possibly oxygen, will try to wean patient. TBD regarding other HME)   Discharge Facility/Level of Care Needs --  other (see comments)  (TBD)     Goal: Interprofessional Rounds/Family Conf  Outcome: Progressing   11/25/16 1209   OTHER   Clinical EDD (Estimated Discharge Date)  (Will team on Tuesday)       Problem: Self-Care Deficit (Adult,Obstetrics,Pediatric)  Goal: Identify Related Risk Factors and Signs and Symptoms  Related risk factors and signs and symptoms are identified upon initiation of Human Response Clinical Practice Guideline (CPG).   Outcome: Progressing   11/25/16 1138   Self-Care Deficit (Adult,Obstetrics,Pediatric)   Related Risk Factors (Self-Care Deficit) activity intolerance;disease process;medication effects;surgery;pain/discomfort   Signs and Symptoms (Self-Care Deficit) decreased balance;gait unstable;weakness, paresis, paralysis     Goal: Improved Ability to Perform BADL and IADL  Patient will demonstrate the desired outcomes by discharge/transition of care.   Outcome: Progressing   11/28/16 1414   Self-Care Deficit (Adult,Obstetrics,Pediatric)   Improved Ability to Perform BADL and IADL making progress toward outcome       Problem: Fall Risk (Adult)  Goal: Identify Related Risk Factors and Signs and Symptoms  Related risk factors and signs and symptoms are identified upon initiation of Human Response Clinical Practice Guideline (CPG).   Outcome: Progressing   11/25/16 1138   Fall Risk (Adult)   Related Risk Factors (Fall Risk) fatigue/slow reaction;gait/mobility problems;polypharmacy;objects hard to reach   Signs and Symptoms (Fall Risk) presence of risk factors     Goal: Absence of Fall  Patient will demonstrate the desired outcomes by discharge/transition of care.   Outcome: Progressing   11/28/16 1414   Fall Risk (Adult)   Absence of Fall making progress toward outcome       Problem: Activity Intolerance (Adult)  Goal: Identify Related Risk Factors and Signs and Symptoms  Related risk factors and signs and symptoms are identified upon initiation of Human Response Clinical Practice Guideline (CPG).   Outcome: Progressing   11/25/16 1138   Activity Intolerance (Adult)   Related Risk Factors (Activity Intolerance) generalized weakness;pain;medication side effects   Signs and Symptoms (Activity Intolerance) pain/discomfort;nausea     Goal:  Activity Tolerance  Patient will demonstrate the desired outcomes by discharge/transition of care.   Outcome: Progressing   11/28/16 1414   Activity Intolerance (Adult)   Activity Tolerance making progress toward outcome     Goal: Effective Energy Conservation Techniques  Patient will demonstrate the desired outcomes by discharge/transition of care.   Outcome: Progressing   11/28/16 1414   Activity Intolerance (Adult)   Effective Energy Conservation Techniques making progress toward outcome       Problem: Nutrition, Imbalanced: Inadequate Oral Intake (Adult)  Goal: Identify Related Risk Factors and Signs and Symptoms  Related risk factors and signs and symptoms are identified upon initiation of Human Response Clinical Practice Guideline (CPG).   Outcome: Progressing   11/26/16 1715   Nutrition, Imbalanced: Inadequate Oral Intake (Adult)   Related Risk Factors (Nutrition Imbalance, Inadequate Oral Intake) NPO status prolonged   Signs and Symptoms (Nutrition Imbalance, Inadequate Oral Intake: Signs and Symptoms) weakness/lethargy     Goal: Improved Oral Intake  Patient will demonstrate the desired outcomes by discharge/transition of care.   Outcome: Progressing   11/28/16 1414   Nutrition, Imbalanced: Inadequate Oral Intake (Adult)   Improved Oral Intake making progress toward outcome     Goal: Prevent Further Weight Loss  Patient will demonstrate the desired outcomes by discharge/transition of care.   Outcome: Progressing   11/28/16 1414   Nutrition, Imbalanced: Inadequate Oral Intake (Adult)   Prevent Further Weight Loss making progress toward outcome       Problem: Infection, Risk/Actual (Adult)  Goal: Identify Related Risk Factors and Signs and Symptoms  Related risk factors and signs and symptoms are identified upon initiation of Human Response Clinical Practice Guideline (CPG).   Outcome: Progressing   11/25/16 1138   Infection, Risk/Actual (Adult)   Related Risk Factors (Infection, Risk/Actual) prolonged hospitalization;medication effects;surgery/procedure   Signs and Symptoms (Infection, Risk/Actual) feeding/eating intolerance;vomiting;weakness;pain     Goal: Infection Prevention/Resolution  Patient will demonstrate the desired outcomes by discharge/transition of care.   Outcome: Progressing   11/28/16 1414   Infection, Risk/Actual (Adult)   Infection Prevention/Resolution making progress toward outcome       Problem: Skin Injury Risk (Adult)  Goal: Identify Related Risk Factors and Signs and Symptoms  Related risk factors and signs and symptoms are identified upon initiation of Human Response Clinical Practice Guideline (CPG).   Outcome: Progressing   11/25/16 1138   Skin Injury Risk (Adult)   Related Risk Factors (Skin Injury Risk) cognitive impairment;hospitalization prolonged;mobility impaired     Goal: Skin Health and Integrity  Patient will demonstrate the desired outcomes by discharge/transition of care.   Outcome: Progressing   11/28/16 1414   Skin Injury Risk (Adult)   Skin Health and Integrity making progress toward outcome       Problem: VTE, DVT and PE (Adult)  Goal: Signs and Symptoms of Listed Potential Problems Will be Absent, Minimized or Managed (VTE, DVT and PE)  Signs and symptoms of listed potential problems will be absent, minimized or managed by discharge/transition of care (reference VTE, DVT and PE (Adult) CPG).   Outcome: Progressing   11/28/16 1414   VTE, DVT and PE (Adult)   Problems Assessed (VTE, DVT, PE) all   Problems Present (VTE, DVT, PE) none

## 2016-11-28 NOTE — Unmapped (Signed)
MRI Screening Form needs to be filled out in EPIC in order to proceed with examination.     Ordering MD needs to contact EP Services at [phone] for interrogation of Cardiac Device, or if outside of normal clinic hours, contact Cardiology Fellow for consult to evaluate if MRI exam can be performed.    Once the pacer is cleared by cardiology....        Also,  the following placed as a note in EPIC:     I, ___________________________(referring physician's name),     am ordering a _______________________________MRI for     ______________________________(patient name and MRN).     I have determined that this MRI is clinically-indicated without acceptable alternative imaging techniques available and the results of this MRI will impact patient care.  I have discussed this with the patient and the patient agrees to undergo the MRI.            I, _NAME OF ORDERING PROVIDER_am ordering a _TYPE OF MRI EXAM_for PT???s NAME. The MRI is to be carried out in Radiology in accordance with the guidelines for MRI conditional devices in accordance with the manufacturer???s guidelines. I have determined that this cardiac MRI is clinically-indicated without acceptable alternative imaging techniques available and the results of this MRI will impact patient care.  I have discussed this with the patient and the patient agrees to undergo the MRI.

## 2016-11-28 NOTE — Unmapped (Signed)
[facility] PROGRESS NOTE    Jeffrey Ward is 66 y.o. male who was seen in consultation over the weekend by the GI service for evaluation of jaundice.     History of Presenting Illness: This is a 66 y.o. 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 Following transplant bilirubin normalized on 09/30/2016. His alkaline phosphatase rose on 7/26 along with his ALT to 74 and AST from 12 to 52. Thereafter AST and ALT returned to normal but alkaline phosphatase remained elevated until 8/22, when he had more significant transaminitis to 554 AST and 329 ALT, which was accompanied by a rise and fall of bilirubin to maximum 5.2, resulting in a liver biopsy and increases in immunosuppression. PEg tube placed on 9/6. His transaminitis again resolved but bilirubin rose again 9/13 to 2.0 and 9/14 to 3.7. ERCP was planned for 9/17 but over the weekend his bilirubin and LFTs normalized and the decision was made to hold off on ERCP. Today, they are elevated again with T Bili 3.2, AST 282, ALT 297 and alk phos at 621. His INR is 1.91 today, down from 2.23 yesterday. He has been on warfarin for paroxysmal afib. Afebrile. Today he reports feeling well. Reports no abdominal pain but does endorse back pain which he says is chronic.     Past Medical History:   Diagnosis Date   ??? Cancer (CMS-HCC)    ??? Cirrhosis (CMS-HCC)    ??? Diabetes (CMS-HCC)    ??? Hepatitis C 07/17/2012   ??? Liver disease    ??? Low back pain 07/17/2012   ??? Varices, esophageal (CMS-HCC)        Past Surgical History:   Procedure Laterality Date   ??? ANKLE SURGERY     ??? BACK SURGERY     ??? BACK SURGERY     ??? CHG US GUIDE, TISSUE ABLATION N/A 01/22/2016    Procedure: ULTRASOUND GUIDANCE FOR, AND MONITORING OF, PARENCHYMAL TISSUE ABLATION;  Surgeon: Particia Nearing, MD; Location: MAIN OR [facility];  Service: Transplant   ??? IR EMBOLIZATION ORGAN ISCHEMIA, TUMORS, INFAR  06/16/2016    IR EMBOLIZATION ORGAN ISCHEMIA, TUMORS, INFAR 06/16/2016 Ammie Dalton, MD IMG VIR H[facility]   ??? PR COLON CA SCRN NOT HI RSK IND N/A 02/27/2015    Procedure: COLOREC CNCR SCR;COLNSCPY NO;  Surgeon: Vonda Antigua, MD;  Location: GI [facility]URES [facility];  Service: Gastroenterology   ??? PR ENDOSCOPIC ULTRASOUND EXAM N/A 02/27/2015    Procedure: UGI ENDO; W/ENDO ULTRASOUND EXAM INCLUDES ESOPHAGUS, STOMACH, &/OR DUODENUM/JEJUNUM;  Surgeon: Vonda Antigua, MD;  Location: GI [facility]URES [facility];  Service: Gastroenterology   ??? PR INSER HEART TEMP PACER ONE CHMBR N/A 10/02/2016    Procedure: Tempoarary Pacemaker Insertion;  Surgeon: Meredith Leeds, MD;  Location: [facility] EP;  Service: Cardiology   ??? PR LAP,DIAGNOSTIC ABDOMEN N/A 01/22/2016    Procedure: Laparoscopy, Abdomen, Peritoneum, & Omentum, Diagnostic, W/Wo Collection Specimen(S) By Brushing Or Washing;  Surgeon: Particia Nearing, MD;  Location: MAIN OR [facility];  Service: Transplant   ??? PR PLACE PERCUT GASTROSTOMY TUBE N/A 11/17/2016    Procedure: UGI ENDO; W/DIRECTED PLCMT PERQ GASTROSTOMY TUBE;  Surgeon: Cletis Athens, MD;  Location: GI [facility]URES [facility];  Service: Gastroenterology   ??? PR TRACHEOSTOMY, PLANNED N/A 09/29/2016  Procedure: TRACHEOSTOMY PLANNED (SEPART PROC);  Surgeon: Katherina Mires, MD;  Location: MAIN OR [facility];  Service: Trauma   ??? PR TRANSCATH INSERT OR REPLACE LEADLESS PM VENTR N/A 10/11/2016    Procedure: Pacemaker Implant/Replace Leadless;  Surgeon: Meredith Leeds, MD;  Location: [facility] EP;  Service: Cardiology   ??? PR TRANSPLANT LIVER,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    Procedure: Liver Allotransplantation; Orthotopic, Partial Or Whole, From Cadaver Or Living Donor, Any Age;  Surgeon: Doyce Loose, MD;  Location: MAIN OR [facility];  Service: Transplant   ??? PR TRANSPLANT,PREP DONOR LIVER, WHOLE N/A 09/15/2016    Procedure: Rogelia Boga Std Prep Cad Donor Whole Liver Gft Prior Tnsplnt,Inc Chole,Diss/Rem Surr Tissu Wo Triseg/Lobe Splt;  Surgeon: Doyce Loose, MD;  Location: MAIN OR [facility];  Service: Transplant   ??? PR UPPER GI ENDOSCOPY,BIOPSY N/A 07/17/2012    Procedure: UGI ENDOSCOPY; WITH BIOPSY, SINGLE OR MULTIPLE;  Surgeon: Alba Destine, MD;  Location: GI PROCEDURES [facility];  Service: Gastroenterology   ??? PR UPPER GI ENDOSCOPY,DIAGNOSIS N/A 02/04/2014    Procedure: UGI ENDO, INCLUDE ESOPHAGUS, STOMACH, & DUODENUM &/OR JEJUNUM; DX W/WO COLLECTION SPECIMN, BY BRUSH OR WASH;  Surgeon: Wilburt Finlay, MD;  Location: GI PROC[facility]URES [facility];  Service: Gastroenterology   ??? PR UPPER GI ENDOSCOPY,LIGAT VARIX N/A 11/05/2013    Procedure: UGI ENDO; W/BAND LIG ESOPH &/OR GASTRIC VARICES;  Surgeon: Wilburt Finlay, MD;  Location: GI [facility]URES [facility];  Service: Gastroenterology       Allergies: No Known Allergies    MEDICATIONS  No current facility-administered medications on file prior to encounter.      Current Outpatient Prescriptions on File Prior to Encounter   Medication Sig Dispense Refill   ??? acetaminophen (TYLENOL) 650 mg/20.3 mL Soln 20.3 mL (650 mg total) by PEG Tube route every four (4) hours as needed. (Patient taking differently: 650 mg by PEG Tube route every four (4) hours as needed. )  0   ??? albuterol 2.5 mg/0.5 mL nebulizer solution Inhale 0.5 mL (2.5 mg total) by nebulization every six (6) hours as needed for wheezing. 30 each 12   ??? amiodarone (PACERONE) 200 MG tablet 1 tablet (200 mg total) by G-tube route daily. 30 tablet 11   ??? cycloSPORINE (NEORAL) 100 mg/mL microemulsion solution 2 mL (200 mg total) by G-tube route Two (2) times a day. 50 mL 12   ??? digoxin 62.5 mcg Tab 65 mcg by G-tube route daily.  0   ??? docusate (COLACE) 50 mg/5 mL liquid 10 mL (100 mg total) by G-tube route Two (2) times a day. 600 mL 0   ??? insulin NPH (HUMULIN,NOVOLIN) 100 unit/mL injection Inject 0.06 mL (6 Units total) under the skin Two (2) times a day (at 6am and 6pm). 3.6 mL 0   ??? insulin regular (HUMULIN,NOVOLIN) 100 unit/mL injection Inject 0-0.12 mL (0-12 Units total) under the skin every six (6) hours. 14.4 mL 0   ??? insulin regular (HUMULIN,NOVOLIN) 100 unit/mL injection Inject 0.04 mL (4 Units total) under the skin every six (6) hours. 4.8 mL 0   ??? melatonin 3 mg Tab 1 tablet (3 mg total) by PEG Tube route nightly.  0   ??? mycophenolate (CELLCEPT) 200 mg/mL suspension 2.5 mL (500 mg total) by G-tube route Two (2) times a day (8am and 8pm). 175 mL 12   ??? pantoprazole (PROTONIX) 40 mg GrPS 1 packet (40 mg total) by G-tube route daily. 30 packet 0   ??? polyethylene  glycol (MIRALAX) 17 gram packet 17 g by G-tube route two (2) times a day as needed. (Patient taking differently: 17 g by G-tube route two (2) times a day as needed. ) 6 packet 0   ??? sildenafil, antihypertensive, (REVATIO) 10 mg/mL SusR oral suspension 3 mL (30 mg total) by PEG Tube route every eight (8) hours. 270 mL 0   ??? sulfamethoxazole-trimethoprim (BACTRIM,SEPTRA) 200-40 mg/5 mL suspension 10 mL by G-tube route 3 (three) times a week. 100 mL 0   ??? thiamine (B-1) 100 MG tablet 1 tablet (100 mg total) by G-tube route daily. 30 tablet 11   ??? traMADol (ULTRAM) 50 mg tablet 1 tablet (50 mg total) by G-tube route every six (6) hours as needed. for up to 5 days  0   ??? valGANciclovir (VALCYTE) 50 mg/mL SolR 9 mL (450 mg total) by PEG Tube route daily.  0   ??? warfarin (COUMADIN) 3 MG tablet 1 tablet (3 mg total) by G-tube route daily with evening meal.  0       FAMILY HISTORY  Family History   Problem Relation Age of Onset   ??? Hypertension Mother    ??? Cirrhosis Neg Hx    ??? Liver cancer Neg Hx      No liver disease or cirrhosis, no GI malignancy  SOCIAL HISTORY  Social History   Substance Use Topics   ??? Smoking status: Never Smoker   ??? Smokeless tobacco: Never Used      Comment: Smoked in high school for about 5 years.    ??? Alcohol use No       Review of Systems:  Gen: no fever, chills, or weight loss  HEENT: no epistaxis, no oral ulcers + hoarseness   Eyes: no jaundice  CV: no chest pain, no orthopnea  PULM: no shortness of breath, no cough  ABD: as per the HPI  MSK: + back pain   Skin: no rash, no pruritis  Neuro: no headache, no confusion  Psych: no depression    Remainder of 10 system review negative.      Physical Exam    Constitutional:   Vitals:   Vitals:    11/28/16 0514   BP: 136/63   Pulse: 86   Resp: 18   Temp: 37.1 ??C (98.8 ??F)   SpO2: 94%     Vitals:    11/24/16 2100   Weight: 68.4 kg (150 lb 12.7 oz)       Normal comprehensive exam:     Constitutional:   Alert, oriented x 3, no acute distress, chronically ill appearing, thin     Mental Status:   Thought organized, appropriate affect, pleasantly interactive, not anxious appearing.   HEENT:   PERRL, conjunctiva clear, anicteric, + hoarseness    Respiratory: Clear to auscultation, unlabored breathing.     Cardiac: Euvolemic, regular rate and rhythm, normal S1 and S2, no murmur.     Abdomen: Soft, normal bowel sounds, non-distended, non-tender, no organomegaly or masses.     Perianal/Rectal Exam Not performed.     Extremities:   No edema, well perfused.   Musculoskeletal: No joint swelling or tenderness noted, no deformities.     Skin: No rashes, jaundice or skin lesions noted.     Neuro: No focal deficits.        Labs:  Lab Results   Component Value Date    ALKPHOS 621 (H) 11/28/2016    BILITOT 3.2 (H) 11/28/2016    BILIDIR 1.20 (  H) 11/24/2016    PROT 5.1 (L) 11/28/2016    ALBUMIN 2.8 (L) 11/28/2016    ALT 297 (H) 11/28/2016    AST 282 (H) 11/28/2016     Lab Results   Component Value Date    WBC 3.0 (L) 11/24/2016    HGB 8.7 (L) 11/24/2016    HCT 25.4 (L) 11/24/2016    PLT 95 (L) 11/24/2016       Imaging: US LIVER on 9/14:   Impression     1.Patent hepatic transplant vasculature.  2.Stable resistive indices in the hepatic transplant arteries, within normal limits.  3.Mild hepatomegaly. Biliary ductal dilatation, slightly increased to unchanged from prior.       Assessment and Plan: 66 y.o. male with a PMH of  has a past medical history of Cancer (CMS-HCC); Cirrhosis (CMS-HCC); Diabetes (CMS-HCC); Hepatitis C (07/17/2012); Liver disease; Low back pain (07/17/2012); and Varices, esophageal (CMS-HCC).  Consult was initially placed for rising bilirubin and common bile duct dilatation concerning for early anastomotic stricture or non-anastomotic stricture. LFTs and bili normalized over weekend and so decision was made to hold on any endoscopic intervention but LFTs and bili are elevated again today. Biopsy on 8/24 was without evidence of acute cellular rejection and was significant for minimal central lobular cholestasis. INR 1.91 today. Platelets at 117. Reviewed with Dr. Josetta Huddle. Will proceed with ERCP/EUS with possible stent placement or dilation of anastomosis. Risks of procedure explained to patient including but not limited to perforation, bleeding, infection, and post-ERCP pancreatitis. Noted to patient that in this case his risks of bleeding is slightly elevated given his supratherapeutic INR.      - This patient was originally scheduled for ERCP on 9/17 down in GI Procedures but there was concerns voiced by anesthesia regarding his right heart function and hx of severe pulm HTN. Considered doing procedure in OR but anesthesia ultimately decided patient could be done in GI procedures.   - Please order repeat echo ASAP to determine need for cardiac anesthesia tomorrow.   - NPO after MN, hold warfarin.     Recommendations reviewed with primary team. Thank you for allowing me to participate in the care of this patient. Please contact biliary team with any questions or concerns.     Oswaldo Conroy, FNP-C  Division of Gastroenterology  Advanced [facility]rapeutic Endoscopy/Biliary Team

## 2016-11-28 NOTE — Unmapped (Signed)
Speech Language Pathology Objective Swallow Study (MBS-FEES)  Repeat MBS (11/28/16 1101)         Patient Name:  Jeffrey Ward       Medical Record Number: [id number]   Date of Birth: Aug 19, 1950  Sex: Male            SLP Treatment Diagnosis:  communication impairment, dysphagia  Reason for Referral: Repeat MBS    Assessment   Patient presented today with oropharyngeal dysphagia that was characterized by delayed swallowing initiation, decreased laryngeal excursion, decreased UES function, and aspiration with thin liquids via cup sip.  Patient is grossly deconditioned and complained of fatigue by the end of the study.  Patient demonstrated significant residue following each bolus presentation, which was not cleared following multiple swallows or throat clearing strategies.  Patient would benefit from the following aspiration precautions:  upright, awake, and alert; remain upright 30 minutes following PO, single/small bites/sips, multiple swallows per bite/sip, and supraglottic swallow maneuver.  SLP is recommending a dysphagia 3 diet with HTL.  Patient would benefit from ongoing dysphagia therapy.  SLP to follow 3-5 times per week.     Recommendations:    PO Recommendations : Medication Via NG/PEG;Soft (Dysphagia 3);Honey-thick liquids      Recommended Compensatory Techniques : Slow rate;Small sips/bites;Upright 90 degrees;Upright 30 min after meal;Feed only when alert/awake;Endurance may be limited: give small meals and snacks;Cough after every 2-3 boluses and re-swallow;Double swallow;Supraglottic swallow      Follow-up Referral Recommendations : Skilled SLP services to treat dysphagia and address aspiration risk factors;ENT evaluation;Follow-up modified barium swallow      Discharge Recommendations:        anticipate supervision needs at DC    Prognosis:    Patient's positive prognostic indicators include progress to date, patient motivation to eat and drink by mouth     Plan of Care     SLP Follow-up / Frequency: 1-2x per day-ST will utilize individual and group tx sessions as needed to target improved cognitive-linguistic function and social communication, 3-5x week Planned Treatment Duration : AIR stay pending ongoing progress toward goals    Goals:  Short Term Goal 1: Patient will tolerate 10/10 teaspoon sized water trials without demonstrating s/sx of aspiration across 2 consecutive sessions.    Short Term Goal 2: Patient will eat a dysphagia 3 diet with HTL without demonstrating s/sx of aspiration across 2 consecutive sessions.   Short Term Goal 3: Patient will participate in pharyngeal strengthening exercises independently or with min cues in 80% of trials across 2 consecutive sessions.               Patient and Family Goals: previously communicated to eventually be decannulated and eat/drink safely     Subjective  Current Functional Status: 66 y.o. 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 He is now s/p liver transplantation on 09/15/16. Post-op course complicated by decompensation thought to be secondary to acute right heart failure and AKI requiring CRRT. Severe PAH diagnosed. New diagnosis of severe portopulmonary hypertension established 7/9. Respiratory distress resulting in tracheostomy placed 7/19. Now decannulated and PEG in place given severe pharyngeal dysphagia observed on previous swallow study.  Patient/Caregiver Reports: Patient reported that he would like to return to eating/drinking.  Equipment/Environment: Supplemental oxygen;Gastric tube  Services patient receives: OT;PT;SLP  Communication Preference: Verbal     Patient has no known allergies.   Current  Facility-Administered Medications   Medication Dose Route Frequency Provider Last Rate Last Dose   ??? acetaminophen (TYLENOL) solution 650 mg  650 mg PEG Tube Q4H PRN Delane Ginger, DO       ??? albuterol 2.5 mg /3 mL (0.083 %) nebulizer solution 2.5 mg  2.5 mg Nebulization Q6H PRN Delane Ginger, DO   2.5 mg at 11/25/16 1116   ??? amiodarone (PACERONE) tablet 200 mg  200 mg PEG Tube Daily Daniel A Sainburg, DO   200 mg at 11/28/16 0836   ??? cycloSPORINE (NEORAL) microemulsion solution 225 mg  225 mg PEG Tube BID Particia Nearing, MD   225 mg at 11/28/16 0835   ??? digoxin (LANOXIN) tablet 62.5 mcg  62.5 mcg PEG Tube Daily Clarisse Gouge Sainburg, DO   62.5 mcg at 11/28/16 0836   ??? docusate (COLACE) 50 mg/5 mL liquid 100 mg  100 mg G-tube BID Delane Ginger, DO   100 mg at 11/26/16 0947   ??? insulin NPH (HumuLIN,NovoLIN) injection 6 Units  6 Units Subcutaneous BID Murvin Donning, MD   6 Units at 11/28/16 0601   ??? insulin regular (HumuLIN,NovoLIN) injection 0-12 Units  0-12 Units Subcutaneous [facility] April Mirian [place], [place]   1 Units at 11/27/16 1810   ??? insulin regular (HumuLIN,NovoLIN) injection 4 Units  4 Units Subcutaneous Q6H [facility] Murvin Donning, MD   4 Units at 11/27/16 1809   ??? lidocaine (LIDODERM) 5 % patch 1 patch  1 patch Transdermal Daily Delane Ginger, DO   1 patch at 11/28/16 0843   ??? melatonin tablet 3 mg  3 mg PEG Tube Nightly Daniel A Sainburg, DO   3 mg at 11/27/16 2105   ??? mycophenolate (CELLCEPT) 200 mg/mL suspension 500 mg  500 mg G-tube BID Trellis Paganini [place], [place]   500 mg at 11/28/16 2841   ??? ocular lubricant (GENTEAL SEVERE) 0.3 % ophthalmic gel 1 drop  1 drop Both Eyes TID PRN Delane Ginger, DO       ??? ondansetron (ZOFRAN-ODT) disintegrating tablet 4 mg  4 mg Oral Q8H PRN Murvin Donning, MD       ??? pantoprazole (PROTONIX) granules 40 mg  40 mg G-tube BID Delane Ginger, DO   40 mg at 11/28/16 3244   ??? polyethylene glycol (MIRALAX) packet 17 g  17 g G-tube BID Clarisse Gouge Sainburg, DO   17 g at 11/26/16 0949   ??? saliva stimulant mucosal spray  1 spray Topical Q2H PRN Delane Ginger, DO       ??? sildenafil (antihypertensive) (REVATIO) 10 mg/mL oral suspension 30 mg  30 mg PEG Tube [facility] Daniel A Sainburg, DO 30 mg at 11/28/16 0554   ??? sulfamethoxazole-trimethoprim (BACTRIM,SEPTRA) 200-40 mg/5 mL suspension 10 mL  10 mL PEG Tube Once per day on Mon Wed Fri Clarisse Gouge [place]ain[place], DO   10 mL at 11/28/16 0102   ??? thiamine (B-1) tablet 100 mg  100 mg PEG Tube Daily Clarisse Gouge [place], DO   100 mg at 11/28/16 0836   ??? traMADol (ULTRAM) tablet 50 mg  50 mg PEG Tube Q4H PRN Delane Ginger, DO   50 mg at 11/28/16 0554   ??? valGANciclovir (VALCYTE) 50 mg/mL oral solution 450 mg  450 mg PEG Tube Daily Clarisse Gouge [place]ain[place], DO   450 mg at 11/28/16 0835   ??? warfarin (COUMADIN) tablet 3 mg  3 mg PEG Tube Daily Reuel Boom  A Sainburg, DO   3 mg at 11/27/16 1811     Patient Active Problem List    Diagnosis Date Noted   ??? 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 Pulmonary hypertension, moderate to severe (CMS-HCC) 10/03/2016   ??? Acute right heart failure (CMS-HCC) 10/03/2016   ??? 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 S/P liver transplant (CMS-HCC) 09/15/2016   ??? Hepatic encephalopathy (CMS-HCC) 07/27/2016   ??? End-stage liver disease (CMS-HCC) 02/09/2016   ??? HCC (hepatocellular carcinoma) (CMS-HCC) 02/09/2016   ??? Right ventricular dilation 01/29/2016   ??? Paroxysmal atrial fibrillation (CMS-HCC) 01/28/2016   ??? Atrial fibrillation with RVR (CMS-HCC) 01/27/2016   ??? Secondary esophageal varices (CMS-HCC) 01/27/2016   ??? Anemia 12/22/2015   ??? Benign prostatic hyperplasia 12/22/2015   ??? History of gastroesophageal reflux (GERD) 12/22/2015   ??? History of substance abuse 12/22/2015   ??? Microscopic hematuria 12/22/2015   ??? Thrombocytopenia (CMS-HCC) 12/22/2015   ??? Vitamin D deficiency 12/22/2015   ??? Intermittent vertigo 08/11/2015   ??? History of hepatitis C virus infection 07/22/2015   ??? Hyperglycemia, unspecified 07/22/2015   ??? Dysrhythmia 07/15/2014   ??? Calculus of ureter 04/20/2013   ??? Hematuria 04/18/2013   ??? Chronic pain disorder 01/09/2013   ??? Postlaminectomy syndrome, lumbar region 01/09/2013   ??? Hepatitis C 07/17/2012   ??? Low back pain 07/17/2012     Past Medical History:   Diagnosis Date   ??? Cancer (CMS-HCC)    ??? Cirrhosis (CMS-HCC)    ??? Diabetes (CMS-HCC)    ??? Hepatitis C 07/17/2012   ??? Liver disease    ??? Low back pain 07/17/2012   ??? Varices, esophageal (CMS-HCC)        Past Surgical History:   Procedure Laterality Date   ??? ANKLE SURGERY     ??? BACK SURGERY     ??? BACK SURGERY     ??? CHG US GUIDE, TISSUE ABLATION N/A 01/22/2016    Procedure: ULTRASOUND GUIDANCE FOR, AND MONITORING OF, PARENCHYMAL TISSUE ABLATION;  Surgeon: Particia Nearing, MD;  Location: MAIN OR [facility];  Service: Transplant   ??? IR EMBOLIZATION ORGAN ISCHEMIA, TUMORS, INFAR  06/16/2016    IR EMBOLIZATION ORGAN ISCHEMIA, TUMORS, INFAR 06/16/2016 Ammie Dalton, MD IMG VIR H&V [facility]   ??? PR COLON CA SCRN NOT HI RSK IND N/A 02/27/2015    Procedure: COLOREC CNCR SCR;COLNSCPY NO;  Surgeon: Vonda Antigua, MD;  Location: GI PROC[facility]URES [facility];  Service: Gastroenterology   ??? PR ENDOSCOPIC ULTRASOUND EXAM N/A 02/27/2015    Procedure: UGI ENDO; W/ENDO ULTRASOUND EXAM INCLUDES ESOPHAGUS, STOMACH, &/OR DUODENUM/JEJUNUM;  Surgeon: Vonda Antigua, MD;  Location: GI PROCEDURES MEM[facility]IAL [facility];  Service: Gastroenterology   ??? PR INSER HEART TEMP PACER ONE CHMBR N/A 10/02/2016    Procedure: Tempoarary Pacemaker Insertion;  Surgeon: Meredith Leeds, MD;  Location: [facility];  Service: Cardiology   ??? PR LAP,DIAGNOSTIC ABDOMEN N/A 01/22/2016    Procedure: Laparoscopy, Abdomen, Peritoneum, & Omentum, Diagnostic, W/Wo Collection Specimen(S) By Brushing Or Washing;  Surgeon: Particia Nearing, MD;  Location: MAIN OR [facility];  Service: Transplant   ??? PR PLACE PERCUT GASTROSTOMY TUBE N/A 11/17/2016    Procedure: UGI ENDO; W/DIRECTED PLCMT PERQ GASTROSTOMY TUBE;  Surgeon: Cletis Athens, MD;  Location: GI PROCEDURES [facility];  Service: Gastroenterology   ??? PR TRACHEOSTOMY, PLANNED N/A 09/29/2016    Procedure: TRACHEOSTOMY PLANNED (SEPART PROC);  Surgeon: Katherina Mires, MD;  Location: MAIN OR [facility];  Service: Trauma   ??? PR TRANSCATH INSERT OR REPLACE LEADLESS PM VENTR N/A 10/11/2016    Procedure: Pacemaker Implant/Replace Leadless;  Surgeon: Meredith Leeds, MD;  Location: [facility] EP;  Service: Cardiology   ??? PR TRANSPLANT LIVER,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    Procedure: Liver Allotransplantation; Orthotopic, Partial Or Whole, From Cadaver Or Living Donor, Any Age;  Surgeon: Doyce Loose, MD;  Location: MAIN OR [facility];  Service: Transplant   ??? PR TRANSPLANT,PREP DONOR LIVER, WHOLE N/A 09/15/2016    Procedure: Rogelia Boga Std Prep Cad Donor Whole Liver Gft Prior Tnsplnt,Inc Chole,Diss/Rem Surr Tissu Wo Triseg/Lobe Splt;  Surgeon: Doyce Loose, MD;  Location: MAIN OR [facility];  Service: Transplant   ??? PR UPPER GI ENDOSCOPY,BIOPSY N/A 07/17/2012    Procedure: UGI ENDOSCOPY; WITH BIOPSY, SINGLE OR MULTIPLE;  Surgeon: Alba Destine, MD;  Location: GI PROC[facility]URES [facility];  Service: Gastroenterology   ??? PR UPPER GI ENDOSCOPY,DIAGNOSIS N/A 02/04/2014    Procedure: UGI ENDO, INCLUDE ESOPHAGUS, STOMACH, & DUODENUM &/OR JEJUNUM; DX W/WO COLLECTION SPECIMN, BY BRUSH OR WASH;  Surgeon: Wilburt Finlay, MD;  Location: GI PROCEDURES [facility];  Service: Gastroenterology   ??? PR UPPER GI ENDOSCOPY,LIGAT VARIX N/A 11/05/2013    Procedure: UGI ENDO; W/BAND LIG ESOPH &/OR GASTRIC VARICES;  Surgeon: Wilburt Finlay, MD;  Location: GI PROCEDURES [facility];  Service: Gastroenterology     Social History   Substance Use Topics   ??? Smoking status: Never Smoker   ??? Smokeless tobacco: Never Used      Comment: Smoked in high school for about 5 years.    ??? Alcohol use No     Family History   Problem Relation Age of Onset   ??? Hypertension Mother    ??? Cirrhosis Neg Hx    ??? Liver cancer Neg Hx          Pain:  6/10 back pain RN notified    Prior Functional Status:   Dysphagia History: no known h/o dysphagia PTA    Diet Prior to this Study: NPO       Recent Procedures/Tests/Findings:  CXR 10/22/16: Right pleural effusion with associated atelectasis. Mild cardiomegaly. No focal airspace disease.    Objective    Cognitive-Communication Status : functional for study   Oral Mechanism : grossly WFL    Respiratory Status : O2 via nasal cannula    Secretions : managing well      Positioning : Upright in chair    Bolus Presentation : Fed by therapist/other staff;Spoon;Cup Sip;Fed self      THIN LIQUID  Oral Stage: WFL    Swallow Initiation : Base of tongue swallow initiation    Nasopharyngeal Reflux : None noted   Pharyngeal Stage : Moderately reduced laryngeal excursion;Full epiglottic inversion;Laryngeal Closure Reduced;Pharyngeal Constriction moderately impaired;Residue after swallow;Tongue Base Retraction moderately impaired    Penetration Aspiration Score: 8 - Material enters the airway, passes below the vocal folds, and no effort is made to eject.    Aspiration Volume : Trace   Esophageal Phase Screening : UES Opening ineffective;Mild backflow noted    Bolus Comments : Patient was able to tolerate teaspoon sized trials without penetration or aspiration. significant residue following swallow, multiple swallows and throat clearing reduced, but did not eliminate residue     THICK LIQUID  Oral Stage : WFL   Swallow Initiation : Vallecular space swallow initiation    Nasopharyngeal Reflux: None noted    Pharyngeal Stage :  Moderately reduced laryngeal excursion;Full epiglottic inversion;Laryngeal Closure Reduced;Pharyngeal Constriction moderately impaired;Tongue Base Retraction moderately impaired   Penetration Aspiration Score : 1 - Material does not enter airway    Aspiration Volume : None   Esophageal Phase Screening : UES Opening ineffective;Mild backflow noted   Bolus Comments : significant residue following swallow, multiple swallows and throat clearing reduced, but did not eliminate residue     PUREE  Oral Stage : WFL   Swallow Initiation : Vallecular space swallow initiation   Nasopharyngeal Reflux: None noted    Pharyngeal Stage : Moderately reduced laryngeal excursion;Full epiglottic inversion;Laryngeal Closure Reduced;Pharyngeal Constriction moderately impaired;Tongue Base Retraction moderately impaired    Penetration Aspiration Score: 1 - Material does not enter airway   Aspiration Volume : None    Esophageal Phase Screening : UES Opening ineffective;Mild backflow noted   Bolus Comments : significant residue following swallow, multiple swallows and throat clearing reduced, but did not eliminate residue      SOLIDS  Oral Stage : WFL    Swallow Initiation : Vallecular space swallow initiation    Nasopharyngeal Reflux: None noted    Pharyngeal Stage : Moderately reduced laryngeal excursion;Full epiglottic inversion;Laryngeal Closure Reduced;Pharyngeal Constriction moderately impaired;Tongue Base Retraction moderately impaired    Penetration Aspiration Score: 1 - Material does not enter airway    Aspiration Volume : None   Esophageal Phase Screening : UES Opening ineffective;Mild backflow noted    Bolus Comments : significant residue following swallow, multiple swallows and throat clearing reduced, but did not eliminate residue          Session Duration : 60  Activity Tolerance: Patient tolerated treatment well    I attest that I have reviewed the above information.  Signed: Ethelda Chick, CCC-SLP  Filed 11/28/2016

## 2016-11-28 NOTE — Unmapped (Signed)
Transplant Surgery Progress Note    Hospital Day: 5    Assessment:     Jeffrey Ward??is a 66 y.o.??male??with a history of IDDM, GERD, prior perioperative a-fib with RVR and 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 Post-op course complicated by decompensation secondary to acute right heart failure, AKI requiring CRRT (resolved), severe pulmonary hypertension responsive to sildenafil, bradycardia/ asystole requiring pacemaker placement, atrial fibrilliation/ narrow complex tachycardia requiring anti-coagulation with warfarin and amiodarone/ digoxin, malnutrition/ debility, and altered mental status/ delirium improved with transition to cyclosporine and psychiatric medications. On 11/03/16 he had a significant spike in his LFTs s/p biopsy 8/24 negative for rejection. He continued to recover and was transferred to floor status 8/28. The patient was transferred back to the SICU 9/9 2/2 GI bleed following PEG placement. Once resuscitated and stabilized, he was transferred back to the floor 9/10. He was evaluated by AIR 9/13 and accepted for admission.     LFTs were elevated on 9/14 and downtrending today. Liver transplant U/S performed 9/14- RIs stable, vessels patent, mild increased dilation of CBD. GI has been consulted for ERCP.       Plan:     -- Continue daily Chem 10, LFTs, CBC, coags  -- LFTs uptrending today. Will proceed with ERCP once GI has availability. We have contacted GI biliary team  -- Continue to monitor Cyclosporine and adjust according to trough     -- Transplant surgery will continue to follow daily    Subjective/Interval Events:   No acute events overnight. NPO for possible procedure.     Objective:        Vital Signs:  BP 136/63  - Pulse 86  - Temp 37.1 ??C (Oral)  - Resp 18  - Wt 68.4 kg (150 lb 12.7 oz)  - SpO2 94%  - BMI 27.75 kg/m??     Input/Output:  I/O last 3 completed shifts:  In: -   Out: 600 [Urine:600]    Physical Exam: General: No acute distress, resting slumped to side  Neuro: Awake and interactive  Lungs: Normal work of breathing on room air, decannulation site healing well    Abdomen: Non-distended    Lab Results   Component Value Date    WBC 3.0 (L) 11/24/2016    HGB 8.7 (L) 11/24/2016    HCT 25.4 (L) 11/24/2016    PLT 95 (L) 11/24/2016       Lab Results   Component Value Date    NA 136 11/28/2016    K 3.9 11/28/2016    CL 97 (L) 11/28/2016    CO2 35.0 (H) 11/28/2016    BUN 18 11/28/2016    CREATININE 0.90 11/28/2016    CALCIUM 8.8 11/28/2016    MG 2.0 11/24/2016    PHOS 3.2 11/24/2016     Lab Results   Component Value Date    ALKPHOS 621 (H) 11/28/2016    BILITOT 3.2 (H) 11/28/2016    BILIDIR 1.20 (H) 11/24/2016    PROT 5.1 (L) 11/28/2016    ALBUMIN 2.8 (L) 11/28/2016    ALT 297 (H) 11/28/2016    AST 282 (H) 11/28/2016    GGT 261 (H) 11/24/2016       Imaging:  No new imaging

## 2016-11-28 NOTE — Unmapped (Signed)
Physical Medicine and Rehabilitation  Daily Progress Note [facility]    ASSESSMENT:     ??Jeffrey Ward??is a 66 y.o.??male??admitted to [facility] for physical rehabilitation s/p liver transplant (09/15/16).  ??  Primary Rehab diagnosis: (Debility) 16 Debility (Non-Cardiac/Non-Pulmonary)    PLAN:     REHAB:   - PT and OT to maximize functional status with mobility and ADLs as well as prevention of joint contracture.   - SLP for cognitive and swallow function.  - RT for community re-integration, education, and leisure support services.  - Pharmacy consult for patient and family education on medication management.   - To be discussed in weekly Interdisciplinary Team Conference.  ??  S/p Liver transplant (09/15/16) For chronic hep C and HCC: Liver ultrasound on 11/03/16 showing mild to moderate intrahepatic biliary duct dilation which appeared to be new from prior ultrasound. Liver biopsy on 11/04/16 did not show acute rejection.The patient had significantly worsening LFTs on 11/25/16 And transplant ultrasound was performed showing RIs stable, vessels patent, mild increased dilation of CBD. Transplant surgery would like the patient to have ERCP and contacted GI for the procedure. They're hoping that this procedure gets done today so the patient was made NPO overnight.   - f/u transplant recs  - Continue cyclosporine, CellCept  - continue Valcyte, Bactrim for ppx  - Plan for ERCP with GI  ??  GIB: Patient had GI bleed from an ulcer which was clipped on 11/20/16 by gastroenterology.   - Protonix 40 mg BID  ??  Paroxismal A-fib: Patient on digoxin, amiodarone, warfarin for a-fib. Patient recently had GI bleed and warfarin was restarted on 9/11. Most recent INR was 1.69 on 11/24/16. Goal INR is 2-3 per Dr. Azucena Cecil. Per Dr. Azucena Cecil the decision was made between transplant and GI that the patient did not need to be on heparin due to bleeding risk and does not need prophylaxis while going back onto warfarin.  - Continue amiodarone, digoxin, warfarin  - follow up cardiology as outpatient  ??  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  ??  Right Heart Failure. Stable: Patient reportedly had right heart failure postop after transplant.   ??  Severe PAH: The patient's wife reports that he was told that he had pulmonary hypertension prior to admission to hospital. 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  DM:   - Endocrine following, follow-up recommendations  ??  Delirium: Patient was on Zyprexa 7.5 mg nightly which is being held due to prolonged QTc. Patient still has when necessary Zyprexa on. Psychiatry has been following and recommends twice weekly EKGs and repleting magnesium to between 2 and 4. The patient's delirium is reportedly improving prior to admission to acute inpatient rehabilitation  - Replete magnesium to between 2 and 4  - Hold Zyprexa  - EKGs weekly  ??  Dysphonia with dysphagia: The patient has been intubated multiple times throughout his hospitalization. Voice has been hoarse/hypophonic since intubations. Will consider ENT referral if it does not improve within a few days of admission to acute inpatient rehabilitation. Will consider this after ERCP  - currently on tube feeds  - consider ENT consult   ??  Nausea/Vomiting: Patient had nausea and vomiting with tube feeds as recently as the morning of 11/24/16. This was thought to be due to high residuals. Tube feed rate was slowed. We'll continue to monitor for symptoms.  ??  Distal LE weakness/dysesthesias: On AIR  admission, patient with left lower extremity weakness and numbness on the dorsal aspect of the left foot since surgery. He reported that he has developed some numbness to the right great toe. The patient developed a spreading dysesthesias of his legs to the mid tibial area bilaterally the day following admission to rehabilitation. 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 Neurology was consulted and believed that the symptoms are consistent with a radiculopathy. They recommended MRI lumbar spine. We will plan for this after the patient gets ERCP as that appears to be the more acute issue at this time.  - Follow-up neuro recs  - MRI lumbar spine wo contrast to evaluate for possible lumosacral radiculopathy    - Prior to MRI, will need to confirm with electrophysiology that an MRI is okay given the patient's recent pacemaker placement     DISPO: Patient to be discussed at weekly interdisciplinary team conference.   - ELOS: TBD.     SUBJECTIVE:     NAEON. The patient denies any new symptoms today. He states that he is feeling relatively well. He denies chest pain, shortness of breath, abdominal pain, nausea, vomiting this morning. He is aware of the plan for ERCP today and understands that this is why he is NPO. He is also aware of the plan for an MRI for possible lumbar radiculopathy.    OBJECTIVE:     Vital signs (last 24 hours):  Temp:  [36.4 ??C-37.1 ??C] 37.1 ??C  Heart Rate:  [71-86] 86  Resp:  [17-18] 18  BP: (136-140)/(63-64) 136/63  SpO2:  [94 %-98 %] 94 %    Intake/Output (last 3 shifts):  I/O last 3 completed shifts:  In: -   Out: 600 [Urine:600]    Physical Exam:  GEN: NAD laying in bed  EYES: EOMI  HENT: Hearing adequate for conversation, soft hoarse voice  RESP: NWOB on 2.5L   ABD: Nondistended  SKIN: no visible rashes   MSK: no notable contractures  NEURO:  ????????????????????????Mental Status: A&Ox3, follows commands well and answers questions appropriately  ????????????????????????Tone: within normal limits, no spasticity noted  PSYCH: mood euthymic, affect appropriate

## 2016-11-29 ENCOUNTER — Inpatient Hospital Stay: Admission: RE | Admit: 2016-11-29 | Discharge: 2016-11-29 | Payer: MEDICARE

## 2016-11-29 DIAGNOSIS — Z4823 Encounter for aftercare following liver transplant: Principal | ICD-10-CM

## 2016-11-29 LAB — GLUCOSE RANDOM: Glucose:MCnc:Pt:Ser/Plas:Qn:: 258 — ABNORMAL HIGH

## 2016-11-29 LAB — PROTIME-INR: PROTIME: 32 s — ABNORMAL HIGH (ref 10.2–12.8)

## 2016-11-29 LAB — COMPREHENSIVE METABOLIC PANEL
ALT (SGPT): 304 U/L — ABNORMAL HIGH (ref 19–72)
ANION GAP: 6 mmol/L — ABNORMAL LOW (ref 9–15)
AST (SGOT): 199 U/L — ABNORMAL HIGH (ref 19–55)
BILIRUBIN TOTAL: 3.4 mg/dL — ABNORMAL HIGH (ref 0.0–1.2)
BLOOD UREA NITROGEN: 18 mg/dL (ref 7–21)
BUN / CREAT RATIO: 19
CALCIUM: 8.6 mg/dL (ref 8.5–10.2)
CHLORIDE: 98 mmol/L (ref 98–107)
CO2: 30 mmol/L (ref 22.0–30.0)
CREATININE: 0.93 mg/dL (ref 0.70–1.30)
EGFR MDRD AF AMER: >=60 mL/min/{1.73_m2} (ref >=60)
EGFR MDRD NON AF AMER: >=60 mL/min/{1.73_m2} (ref >=60)
GLUCOSE RANDOM: 258 mg/dL — ABNORMAL HIGH (ref 65–179)
POTASSIUM: 4.1 mmol/L (ref 3.5–5.0)
PROTEIN TOTAL: 5 g/dL — ABNORMAL LOW (ref 6.5–8.3)
SODIUM: 134 mmol/L — ABNORMAL LOW (ref 135–145)

## 2016-11-29 LAB — PROTIME: Lab: 32 — ABNORMAL HIGH

## 2016-11-29 NOTE — Unmapped (Signed)
Physical Medicine and Rehabilitation  Daily Progress Note [facility]    ASSESSMENT:     ??Jeffrey Ward??is a 66 y.o.??male??admitted to [facility] for physical rehabilitation s/p liver transplant (09/15/16).  ??  Primary Rehab diagnosis: (Debility) 16 Debility (Non-Cardiac/Non-Pulmonary)    PLAN:     REHAB:   - PT and OT to maximize functional status with mobility and ADLs as well as prevention of joint contracture.   - SLP for cognitive and swallow function.  - RT for community re-integration, education, and leisure support services.  - Pharmacy consult for patient and family education on medication management.   - To be discussed in weekly Interdisciplinary Team Conference.  ??  S/p Liver transplant (09/15/16) For chronic hep C and HCC: Liver ultrasound on 11/03/16 showing mild to moderate intrahepatic biliary duct dilation which appeared to be new from prior ultrasound. Liver biopsy on 11/04/16 did not show acute rejection.The patient had significantly worsening LFTs on 11/25/16 And transplant ultrasound was performed showing RIs stable, vessels patent, mild increased dilation of CBD. Transplant surgery would like the patient to have ERCP and contacted GI for the procedure. The patient needed echocardiogram due to pulmonary hypertension prior to ERCP. This was not able to be done yesterday so the procedure was moved to today.   - f/u transplant recs  - Continue cyclosporine, CellCept  - continue Valcyte, Bactrim for ppx  - Plan for ERCP with GI today  ??  GIB: Patient had GI bleed from an ulcer which was clipped on 11/20/16 by gastroenterology.   - Protonix 40 mg BID  ??  Paroxismal A-fib: Patient on digoxin, amiodarone, warfarin for a-fib. Patient recently had GI bleed and warfarin was restarted on 9/11. Most recent INR was 1.69 on 11/24/16. Goal INR is 2-3 per Dr. Azucena Cecil. Per Dr. Azucena Cecil the decision was made between transplant and GI that the patient did not need to be on heparin due to bleeding risk and does not need prophylaxis while going back onto warfarin.  - Continue amiodarone, digoxin, warfarin  - follow up cardiology as outpatient  ??  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  ??  Right Heart Failure. Stable: Patient reportedly had right heart failure postop after transplant.   ??  Severe PAH: The patient's wife reports that he was told that he had pulmonary hypertension prior to admission to hospital. 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  DM:   - Endocrine following, follow-up recommendations  ??  Delirium: Patient was on Zyprexa 7.5 mg nightly which is being held due to prolonged QTc. Patient still has when necessary Zyprexa on. Psychiatry has been following and recommends twice weekly EKGs and repleting magnesium to between 2 and 4. The patient's delirium is reportedly improving prior to admission to acute inpatient rehabilitation  - Replete magnesium to between 2 and 4  - Hold Zyprexa  - EKGs weekly  ??  Dysphonia with dysphagia: The patient has been intubated multiple times throughout his hospitalization. Voice has been hoarse/hypophonic since intubations. Will consider ENT referral if it does not improve within a few days of admission to acute inpatient rehabilitation. Will consider this after ERCP  - currently on tube feeds  - consider ENT consult   ??  Nausea/Vomiting: Patient had nausea and vomiting with tube feeds as recently as the morning of 11/24/16. This was thought to be due to high residuals. Tube feed rate was slowed. We'll continue  to monitor for symptoms.  ??  Distal LE weakness/dysesthesias: On AIR admission, patient with left lower extremity weakness and numbness on the dorsal aspect of the left foot since surgery. He reported that he has developed some numbness to the right great toe. The patient developed a spreading dysesthesias of his legs to the mid tibial area bilaterally the day following admission to rehabilitation. 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 Neurology was consulted and believed that the symptoms are consistent with a radiculopathy. They recommended MRI lumbar spine. We will plan for this after the patient gets ERCP as his liver appears to be the more acute issue at this time.  - Follow-up neuro recs  - MRI lumbar spine wo contrast to evaluate for possible lumosacral radiculopathy    - Prior to MRI, will need electrophysiology to come by and evaluate given recent pacemaker placement     DISPO: Patient to be discussed at weekly interdisciplinary team conference.   - ELOS: 9/26     SUBJECTIVE:     NAEON. The patient is aware of the plan to go down for liver biopsy later today. He is also aware future plans for MRI of his lumbar spine and ENT referral for his dysphasia/dysphonia and knows that these won't happen until after liver biopsy. The patient denies any new complaints today. He states that he understands that he should be sitting for 30 minutes after eating (therapy reports he has not been consistently doing this). The patient denies chest pain, shortness of breath, chills or any other new symptoms. He did have an elevated temperature recorded this morning but repeat temperature was 36.2 shortly after.     OBJECTIVE:     Vital signs (last 24 hours):  Temp:  [35.8 ??C-38.2 ??C] 36.2 ??C  Heart Rate:  [66-86] 86  Resp:  [18] 18  BP: (136-146)/(69-71) 146/71  SpO2:  [93 %-95 %] 95 %    Physical Exam:  GEN: NAD laying in bed  EYES: EOMI  HENT: Hearing adequate for conversation, soft hoarse voice  RESP: NWOB on 2L [place]  CV: RRR  ABD: Nondistended  SKIN: no visible rashes   MSK: no notable contractures  NEURO: follows commands well and answers questions appropriately  PSYCH: mood euthymic, affect appropriate

## 2016-11-29 NOTE — Unmapped (Signed)
Problem: Patient Care Overview  Goal: Plan of Care Review  Outcome: Progressing   11/25/16 1138   OTHER   Plan of Care Reviewed With patient;spouse   Plan of Care Review   Progress improving   Patient is A&O. Patient is continent of bowel and bladder, last BM was 11/28/16. Pt has been NPO since 0000 for procedure, otherwise on Dysphagia 3 diet and honey thickened liquids. Pain relieved with PRN meds. Safety and skin protocol maintained. Bed in lowest position, bed alarm on, call bell within reach, will continue to monitor.     Goal: Discharge Needs Assessment  Outcome: Progressing   11/25/16 0950 11/25/16 1209   OTHER   Equipment Currently Used at Home --  cane, straight;glucometer  (Used a cane occasionally. )   Patient and/or family were provided with choice of facilities / services that are available and appropriate to meet post hospital care needs? --  N/A   Discharge Needs Assessment   Concerns to be Addressed --  adjustment to diagnosis/illness;care coordination/care conferences;discharge planning   Readmission Within the Last 30 Days --  planned readmission  (Acute to AIR admission)   Transportation Anticipated family or friend will provide --    Anticipated Changes Related to Illness --  inability to care for self   Equipment Needed After Discharge --  other (see comments)  (Possibly oxygen, will try to wean patient. TBD regarding other HME)   Discharge Facility/Level of Care Needs --  other (see comments)  (TBD)     Goal: Interprofessional Rounds/Family Conf  Outcome: Progressing   11/25/16 1209   OTHER   Clinical EDD (Estimated Discharge Date)  (Will team on Tuesday)       Problem: Self-Care Deficit (Adult,Obstetrics,Pediatric)  Goal: Identify Related Risk Factors and Signs and Symptoms  Related risk factors and signs and symptoms are identified upon initiation of Human Response Clinical Practice Guideline (CPG).   Outcome: Progressing   11/25/16 1138   Self-Care Deficit (Adult,Obstetrics,Pediatric) Related Risk Factors (Self-Care Deficit) activity intolerance;disease process;medication effects;surgery;pain/discomfort   Signs and Symptoms (Self-Care Deficit) decreased balance;gait unstable;weakness, paresis, paralysis     Goal: Improved Ability to Perform BADL and IADL  Patient will demonstrate the desired outcomes by discharge/transition of care.   Outcome: Progressing   11/29/16 1511   Self-Care Deficit (Adult,Obstetrics,Pediatric)   Improved Ability to Perform BADL and IADL making progress toward outcome       Problem: Fall Risk (Adult)  Goal: Identify Related Risk Factors and Signs and Symptoms  Related risk factors and signs and symptoms are identified upon initiation of Human Response Clinical Practice Guideline (CPG).   Outcome: Progressing   11/25/16 1138   Fall Risk (Adult)   Related Risk Factors (Fall Risk) fatigue/slow reaction;gait/mobility problems;polypharmacy;objects hard to reach   Signs and Symptoms (Fall Risk) presence of risk factors     Goal: Absence of Fall  Patient will demonstrate the desired outcomes by discharge/transition of care.   Outcome: Progressing   11/29/16 1511   Fall Risk (Adult)   Absence of Fall making progress toward outcome       Problem: Activity Intolerance (Adult)  Goal: Identify Related Risk Factors and Signs and Symptoms  Related risk factors and signs and symptoms are identified upon initiation of Human Response Clinical Practice Guideline (CPG).   Outcome: Progressing   11/25/16 1138   Activity Intolerance (Adult)   Related Risk Factors (Activity Intolerance) generalized weakness;pain;medication side effects   Signs and Symptoms (Activity Intolerance) pain/discomfort;nausea  Goal: Activity Tolerance  Patient will demonstrate the desired outcomes by discharge/transition of care.   Outcome: Progressing   11/29/16 1511   Activity Intolerance (Adult)   Activity Tolerance making progress toward outcome     Goal: Effective Energy Conservation Techniques  Patient will demonstrate the desired outcomes by discharge/transition of care.   Outcome: Progressing   11/29/16 1511   Activity Intolerance (Adult)   Effective Energy Conservation Techniques making progress toward outcome       Problem: Nutrition, Imbalanced: Inadequate Oral Intake (Adult)  Goal: Identify Related Risk Factors and Signs and Symptoms  Related risk factors and signs and symptoms are identified upon initiation of Human Response Clinical Practice Guideline (CPG).   Outcome: Progressing   11/26/16 1715   Nutrition, Imbalanced: Inadequate Oral Intake (Adult)   Related Risk Factors (Nutrition Imbalance, Inadequate Oral Intake) NPO status prolonged   Signs and Symptoms (Nutrition Imbalance, Inadequate Oral Intake: Signs and Symptoms) weakness/lethargy     Goal: Improved Oral Intake  Patient will demonstrate the desired outcomes by discharge/transition of care.   Outcome: Progressing   11/29/16 1511   Nutrition, Imbalanced: Inadequate Oral Intake (Adult)   Improved Oral Intake making progress toward outcome     Goal: Prevent Further Weight Loss  Patient will demonstrate the desired outcomes by discharge/transition of care.   Outcome: Progressing   11/29/16 1511   Nutrition, Imbalanced: Inadequate Oral Intake (Adult)   Prevent Further Weight Loss making progress toward outcome       Problem: Infection, Risk/Actual (Adult)  Goal: Identify Related Risk Factors and Signs and Symptoms  Related risk factors and signs and symptoms are identified upon initiation of Human Response Clinical Practice Guideline (CPG).   Outcome: Progressing   11/25/16 1138   Infection, Risk/Actual (Adult)   Related Risk Factors (Infection, Risk/Actual) prolonged hospitalization;medication effects;surgery/procedure   Signs and Symptoms (Infection, Risk/Actual) feeding/eating intolerance;vomiting;weakness;pain     Goal: Infection Prevention/Resolution  Patient will demonstrate the desired outcomes by discharge/transition of care.   Outcome: Progressing   11/29/16 1511   Infection, Risk/Actual (Adult)   Infection Prevention/Resolution making progress toward outcome       Problem: Skin Injury Risk (Adult)  Goal: Identify Related Risk Factors and Signs and Symptoms  Related risk factors and signs and symptoms are identified upon initiation of Human Response Clinical Practice Guideline (CPG).   Outcome: Progressing   11/25/16 1138   Skin Injury Risk (Adult)   Related Risk Factors (Skin Injury Risk) cognitive impairment;hospitalization prolonged;mobility impaired     Goal: Skin Health and Integrity  Patient will demonstrate the desired outcomes by discharge/transition of care.   Outcome: Progressing   11/29/16 1511   Skin Injury Risk (Adult)   Skin Health and Integrity making progress toward outcome       Problem: VTE, DVT and PE (Adult)  Goal: Signs and Symptoms of Listed Potential Problems Will be Absent, Minimized or Managed (VTE, DVT and PE)  Signs and symptoms of listed potential problems will be absent, minimized or managed by discharge/transition of care (reference VTE, DVT and PE (Adult) CPG).   Outcome: Progressing   11/29/16 1511   VTE, DVT and PE (Adult)   Problems Assessed (VTE, DVT, PE) all   Problems Present (VTE, DVT, PE) none

## 2016-11-29 NOTE — Unmapped (Signed)
Transplant Surgery Progress Note    Hospital Day: 6    Assessment:     Jeffrey Ward??is a 66 y.o.??male??with a history of IDDM, GERD, prior perioperative a-fib with RVR and 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 Post-op course complicated by decompensation secondary to acute right heart failure, AKI requiring CRRT (resolved), severe pulmonary hypertension responsive to sildenafil, bradycardia/ asystole requiring pacemaker placement, atrial fibrilliation/ narrow complex tachycardia requiring anti-coagulation with warfarin and amiodarone/ digoxin, malnutrition/ debility, and altered mental status/ delirium improved with transition to cyclosporine and psychiatric medications. On 11/03/16 he had a significant spike in his LFTs s/p biopsy 8/24 negative for rejection. He continued to recover and was transferred to floor status 8/28. The patient was transferred back to the SICU 9/9 2/2 GI bleed following PEG placement. Once resuscitated and stabilized, he was transferred back to the floor 9/10. He was evaluated by AIR 9/13 and accepted for admission.     LFTs were elevated on 9/14 and downtrending today. Liver transplant U/S performed 9/14- RIs stable, vessels patent, mild increased dilation of CBD. GI has been consulted for ERCP.       Plan:     -- Continue daily Chem 10, LFTs, CBC, coags  -- LFTs continue to uptrend. Will proceed with ERCP once GI has availability. We have contacted GI biliary team  -- ECHO completed 9/18  -- Per GI, hold coumadin until after ERCP  -- Continue to monitor Cyclosporine and adjust according to trough     -- Transplant surgery will continue to follow daily    Subjective/Interval Events:   No acute events overnight. ECHO performed this morning    Objective:        Vital Signs:  BP 146/71  - Pulse 86  - Temp 36.2 ??C (Oral)  - Resp 18  - Wt 68.4 kg (150 lb 12.7 oz)  - SpO2 95%  - BMI 27.75 kg/m??     Input/Output:  No intake/output data recorded.    Physical Exam:  General: No acute distress, lying in bed  Neuro: Awake and interactive  Lungs: Normal work of breathing on room air, decannulation site healing well    Abdomen: Soft, non-tender, non-distended, incision well healed    Lab Results   Component Value Date    WBC 1.2 (L) 11/28/2016    HGB 9.0 (L) 11/28/2016    HCT 26.1 (L) 11/28/2016    PLT 117 (L) 11/28/2016       Lab Results   Component Value Date    NA 134 (L) 11/29/2016    K 4.1 11/29/2016    CL 98 11/29/2016    CO2 30.0 11/29/2016    BUN 18 11/29/2016    CREATININE 0.93 11/29/2016    CALCIUM 8.6 11/29/2016    MG 2.0 11/24/2016    PHOS 3.2 11/24/2016     Lab Results   Component Value Date    ALKPHOS 625 (H) 11/29/2016    BILITOT 3.4 (H) 11/29/2016    BILIDIR 1.20 (H) 11/24/2016    PROT 5.0 (L) 11/29/2016    ALBUMIN 2.7 (L) 11/29/2016    ALT 304 (H) 11/29/2016    AST 199 (H) 11/29/2016    GGT 261 (H) 11/24/2016       Imaging:  No new imaging

## 2016-11-29 NOTE — Unmapped (Signed)
Problem: Patient Care Overview  Goal: Plan of Care Review  Outcome: Progressing  Pt is A&O.  Dysphagia 3 diet, NPO at midnight for procedure. Continent of bowel and bladder.  No signs of skin breakdown.  Pain relieved with prn meds. Skin and safety protocol maintained.  Bed in lowest position, call bell within reach, wheels locked.  Will continue to monitor.    11/25/16 1138   OTHER   Plan of Care Reviewed With patient;spouse   Plan of Care Review   Progress improving       Problem: Self-Care Deficit (Adult,Obstetrics,Pediatric)  Goal: Identify Related Risk Factors and Signs and Symptoms  Related risk factors and signs and symptoms are identified upon initiation of Human Response Clinical Practice Guideline (CPG).   Outcome: Progressing   11/25/16 1138   Self-Care Deficit (Adult,Obstetrics,Pediatric)   Related Risk Factors (Self-Care Deficit) activity intolerance;disease process;medication effects;surgery;pain/discomfort   Signs and Symptoms (Self-Care Deficit) decreased balance;gait unstable;weakness, paresis, paralysis     Goal: Improved Ability to Perform BADL and IADL  Patient will demonstrate the desired outcomes by discharge/transition of care.   Outcome: Progressing   11/29/16 0056   Self-Care Deficit (Adult,Obstetrics,Pediatric)   Improved Ability to Perform BADL and IADL making progress toward outcome       Problem: Fall Risk (Adult)  Goal: Identify Related Risk Factors and Signs and Symptoms  Related risk factors and signs and symptoms are identified upon initiation of Human Response Clinical Practice Guideline (CPG).   Outcome: Progressing   11/25/16 1138   Fall Risk (Adult)   Related Risk Factors (Fall Risk) fatigue/slow reaction;gait/mobility problems;polypharmacy;objects hard to reach   Signs and Symptoms (Fall Risk) presence of risk factors     Goal: Absence of Fall  Patient will demonstrate the desired outcomes by discharge/transition of care.   Outcome: Progressing   11/29/16 0056   Fall Risk (Adult)   Absence of Fall making progress toward outcome       Problem: Activity Intolerance (Adult)  Goal: Identify Related Risk Factors and Signs and Symptoms  Related risk factors and signs and symptoms are identified upon initiation of Human Response Clinical Practice Guideline (CPG).   Outcome: Progressing   11/25/16 1138   Activity Intolerance (Adult)   Related Risk Factors (Activity Intolerance) generalized weakness;pain;medication side effects   Signs and Symptoms (Activity Intolerance) pain/discomfort;nausea     Goal: Activity Tolerance  Patient will demonstrate the desired outcomes by discharge/transition of care.   Outcome: Progressing   11/29/16 0056   Activity Intolerance (Adult)   Activity Tolerance making progress toward outcome     Goal: Effective Energy Conservation Techniques  Patient will demonstrate the desired outcomes by discharge/transition of care.   Outcome: Progressing   11/29/16 0056   Activity Intolerance (Adult)   Effective Energy Conservation Techniques making progress toward outcome       Problem: Nutrition, Imbalanced: Inadequate Oral Intake (Adult)  Goal: Identify Related Risk Factors and Signs and Symptoms  Related risk factors and signs and symptoms are identified upon initiation of Human Response Clinical Practice Guideline (CPG).   Outcome: Progressing   11/26/16 1715   Nutrition, Imbalanced: Inadequate Oral Intake (Adult)   Related Risk Factors (Nutrition Imbalance, Inadequate Oral Intake) NPO status prolonged   Signs and Symptoms (Nutrition Imbalance, Inadequate Oral Intake: Signs and Symptoms) weakness/lethargy     Goal: Improved Oral Intake  Patient will demonstrate the desired outcomes by discharge/transition of care.   Outcome: Progressing   11/29/16 0056   Nutrition, Imbalanced:  Inadequate Oral Intake (Adult)   Improved Oral Intake making progress toward outcome     Goal: Prevent Further Weight Loss  Patient will demonstrate the desired outcomes by discharge/transition of care.   Outcome: Progressing   11/29/16 0056   Nutrition, Imbalanced: Inadequate Oral Intake (Adult)   Prevent Further Weight Loss making progress toward outcome       Problem: Infection, Risk/Actual (Adult)  Goal: Identify Related Risk Factors and Signs and Symptoms  Related risk factors and signs and symptoms are identified upon initiation of Human Response Clinical Practice Guideline (CPG).   Outcome: Progressing   11/25/16 1138   Infection, Risk/Actual (Adult)   Related Risk Factors (Infection, Risk/Actual) prolonged hospitalization;medication effects;surgery/procedure   Signs and Symptoms (Infection, Risk/Actual) feeding/eating intolerance;vomiting;weakness;pain     Goal: Infection Prevention/Resolution  Patient will demonstrate the desired outcomes by discharge/transition of care.   Outcome: Progressing   11/29/16 0056   Infection, Risk/Actual (Adult)   Infection Prevention/Resolution making progress toward outcome       Problem: Skin Injury Risk (Adult)  Goal: Identify Related Risk Factors and Signs and Symptoms  Related risk factors and signs and symptoms are identified upon initiation of Human Response Clinical Practice Guideline (CPG).   Outcome: Progressing   11/25/16 1138   Skin Injury Risk (Adult)   Related Risk Factors (Skin Injury Risk) cognitive impairment;hospitalization prolonged;mobility impaired     Goal: Skin Health and Integrity  Patient will demonstrate the desired outcomes by discharge/transition of care.   Outcome: Progressing   11/29/16 0056   Skin Injury Risk (Adult)   Skin Health and Integrity making progress toward outcome       Problem: VTE, DVT and PE (Adult)  Goal: Signs and Symptoms of Listed Potential Problems Will be Absent, Minimized or Managed (VTE, DVT and PE)  Signs and symptoms of listed potential problems will be absent, minimized or managed by discharge/transition of care (reference VTE, DVT and PE (Adult) CPG).   Outcome: Progressing   11/29/16 0056   VTE, DVT and PE (Adult)   Problems Assessed (VTE, DVT, PE) all   Problems Present (VTE, DVT, PE) none

## 2016-11-29 NOTE — Unmapped (Signed)
[facility]   Psychiatry Follow-up Consult Note    Service Date: November 29, 2016  LOS:  LOS: 5 days     Assessment:    Jeffrey Ward is a 66 y.o. 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  Psychiatry was consulted by Delane Ginger, MD [facility] resident for continued f/u of delirium. Today, Jeffrey Ward is being seen in consultation for evaluation of medication management following transfer to [facility] 9/13.    At this time, the patient's presentation continues to be consistent with resolved delirium.  Pt has not been on Zyprexa since 9/9 in the setting of QTc prolongation, and is not currently demonstrating disorientation or inattention at this time. He would benefit from targeting improvement in sleep. Recommend increasing melatonin and providing low-dose trazodone as needed.    Safety Assessment:   No acute safety concerns identified. In setting of recent delirium, pt at increased risk for recurrent confusion, agitation.     Diagnoses:   Active Hospital problems:  Active Problems:    Mixed level of activity delirium due to multiple etiologies, resolved    Insomnia      Recommendations:   -- Safety Recommendations: No acute safety concerns at this time.    -- Medication Recommendations:    Increase melatonin to 6 mg qHS  Start trazodone 25 mg qhs prn for insomnia  Continue thiamine 100 mg po daily   Recommend multivitamin and folate 1 mg po daily    -- Disposition: Deferred.    -- Behavioral Recommendations: Recommend delirium precautions/protocol.    Thank you for this consult request. Recommendations have been communicated to the primary team.  We will follow as needed at this time. Please page [phone] or [phone] (after hours/weekends) for any questions or concerns.     Discussed with and seen by Attending Breck Coons, MD, who agrees with the assessment and plan.     Sherilyn Dacosta, MS4    I attest that I have reviewed the student note and that the components of the history of the present illness, the physical exam, and the assessment and plan documented were performed by me or were performed in my presence by the student where I verified the documentation and performed (or re-performed) the exam and medical decision making. I have edited the student's note.    Delila Spence, MD    I saw and evaluated the patient, participating in the key portions of the service.  I reviewed the resident???s note.  I agree with the resident???s findings and plan.     Cloria Spring, MD        Interval History   Updates of hospital course since last seen by psychiatry: Transferred to [facility] 9/13.  Cyclosporine level 9/17 increased to 517ng/ml, though pharmacy did not feel this was a true trough and will have it redrawn 9/20.  Had increasing bilirubin 9/13 which rose to 3.4mg /dl 1/91 but ERCP not done as it returned to 1.6mg /dl over the weekend.  Though increased again to 3.2 9/17, and again to 3.4mg /dl 4/78 with increases in all LFT's as well.  Plan is for ERCP 9/18 pending results of ECHO.    Patient Interview: [name] with RN and primary resident present.  Jeffrey Ward said that he was doing okay.  He's still having back pain which has been going on for a while.  He also endorsed difficulty sleeping.  He denied that  his mind was playing tricks on him, and denied any AH or VH.  He also denied any thoughts of self harm or SI.  Regarding attention testing he was able to state the months of the year backwards.      ROS:   Gen: positive for Back pain    Current Medications:  Scheduled Meds:  ??? amiodarone  200 mg PEG Tube Daily   ??? cycloSPORINE  225 mg PEG Tube BID   ??? digoxin  62.5 mcg PEG Tube Daily   ??? docusate  100 mg G-tube BID   ??? insulin NPH  6 Units Subcutaneous BID   ??? insulin regular  0-12 Units Subcutaneous Q6H [facility]   ??? insulin regular  4 Units Subcutaneous Q6H [facility]   ??? lidocaine  1 patch Transdermal Daily   ??? melatonin  3 mg PEG Tube Nightly   ??? mycophenolate 500 mg G-tube BID   ??? pantoprazole  40 mg G-tube BID   ??? polyethylene glycol  17 g G-tube BID   ??? sildenafil (antihypertensive)  30 mg PEG Tube [facility]   ??? sulfamethoxazole-trimethoprim  10 mL PEG Tube Once per day on Mon Wed Fri   ??? thiamine  100 mg PEG Tube Daily   ??? valGANciclovir  450 mg PEG Tube Daily   ??? warfarin  3 mg PEG Tube Daily     Continuous Infusions:    PRN Meds:.acetaminophen, albuterol, ocular lubricant, ondansetron, saliva stimulant comb. no.3, traMADol      Objective   Vitals:  Temp:  [35.8 ??C (96.4 ??F)-38.2 ??C (100.8 ??F)] 36.2 ??C (97.1 ??F)  Heart Rate:  [66-86] 86  Resp:  [18] 18  BP: (136-146)/(69-71) 146/71  SpO2:  [93 %-95 %] 95 %    GEN: no acute distress, endorses back pain    Mental Status Exam:  Appearance:  appears stated age, laying in bed.   Attitude:   calm, cooperative and polite   Behavior/Psychomotor:  appropriate eye contact, no abnormal movements and no psychomotor agitation   Speech/Language:   normal rate, tone, fluency, but low volume as pt speaks in a whisper d/t recent trach   Mood:  ???Okay???   Affect:  calm, cooperative and mood congruent   Thought process:  logical, linear, clear, coherent, goal directed   Thought content:    denies thoughts of self-harm. Denies SI, plans, or intent.  No grandiose, self-referential, persecutory, or paranoid delusions noted.   Perceptual disturbances:   denies auditory and visual hallucinations and behavior not concerning for response to internal stimuli   Attention:  able to fully attend without fluctuations in consciousness and on months of the year backwards stated them successfully.   Concentration: Able to fully concentrate and attend   Orientation:  Oriented to person, place, time, and general circumstances   Memory:  Immediate, short-term, long-term, and recall grossly intact   Fund of knowledge:   not formally assessed   Insight:    Fair   Judgment:   Fair   Impulse Control:  Fair     Data Reviewed:  I reviewed labs from the last 24 hours.

## 2016-11-29 NOTE — Unmapped (Signed)
Endocrinology Consult Note    Requesting Attending Physician :  Merrily Brittle, DO  Service Requesting Consult : Physical Medicine and Rehabilitation [facility])  Primary Care Provider: [name], MD  Outpatient Endocrinologist: N/A    Assessment/Recommendations:    66 yo male with h/o DM2 who s/p liver transplant. Endo consulted for management of DM in the setting of recent initiation of prednisone and tacrolimus and now on 14h tube feeds.     1. Type 2 diabetes, uncontrolled:   *No new recs; Multiple insulin doses held.  Nutritional insulin is to be held if TF held however correction insulin (SS) should be administered as this is intended to correct an elevated BG.   - continue  NPH 6u BID (do not hold if NPO). Previously hypoglycemic on 8 units when NPO.  - continue nutritional dose of regular insulin 4u Q6hrs (hold if TF held only)  - continue senstive scale-regular insulin Q6hrs.    (do not hold)    -Advise of any changes to steroid dose or to diet    - utilized the insulin subcutaneous management order set so administration instruction can help quide correct administration when NPO.    2. Nutrition:  Variable,thus complicating overall glycemic control. Currently NPO,TF at goal rate rate of 60 mL/hr  --- at goal, order will provide 1260 mL, 2268 kcal, 102 g PRO, 202 g CHO, 919 mL free water w/ additional FWF per team  ??  Eventual bolus regimen per notes (will likely require additional insulin for this):  --- start with 120 mL, run over pump x 1 hr   --- increase bolus by 30 mL q4 hrs   --- final goal: 1 can (237 mL) 5x daily (6a, 10a, 2p, 6p, 10p)              At goal, order will provide 1185 mL, 2133 kcal, 96 g PRO, 190        g CHO,  865 mL free water      3. Potential for amio induced thyroid dysfunction  Amio started 8/14  -Baseline TSH checked: 4.0  -Check TFT q6 months      Thank you for the consult. We will continue to follow patient as needed. Please call/page [phone] with questions or concerns. History of Present Illness: :    Reason for Consult:   DM2, exacerbated by steroids and calcineurin inhibitor    Cleve Paolillo is a 66 y.o. male w/ h/o DM2, Afib, pulmonary HTN, HCV cirrhosis admitted for <principal problem not specified> on 7/5. I have been asked to evaluate Terril for DM management. Post-op course was complicated by volume overload thought to be due to CHF exacerbation, respiratory failure now trached and on vent, and AKI requiring CRRT. A1c was 8.0 prior to surgery. He is on prednisone 2.5 mg qd since surgery, along with tacrolimus. He was transitioned from insulin gtt to sq POD1, while still NPO. NPH 15u BID achieved BG 130-200 at that time. However, once he started tube feeds, glucoses rose to 300s so he was switched back to insulin gtt on 7/18. He is currently on continuous tube feeds at goal rate, comprising 324 gm CHO per day.     Interval history: BG variable.    All Medications:     Current Facility-Administered Medications   Medication Dose Route Frequency Provider Last Rate Last Dose   ??? acetaminophen (TYLENOL) solution 650 mg  650 mg PEG Tube Q4H PRN Delane Ginger, DO  650 mg at 11/29/16 0447   ??? albuterol 2.5 mg /3 mL (0.083 %) nebulizer solution 2.5 mg  2.5 mg Nebulization Q6H PRN Delane Ginger, DO   2.5 mg at 11/25/16 1116   ??? amiodarone (PACERONE) tablet 200 mg  200 mg PEG Tube Daily Daniel A Sainburg, DO   200 mg at 11/29/16 0831   ??? cycloSPORINE (NEORAL) microemulsion solution 225 mg  225 mg PEG Tube BID Particia Nearing, MD   225 mg at 11/29/16 0837   ??? digoxin (LANOXIN) tablet 62.5 mcg  62.5 mcg PEG Tube Daily Clarisse Gouge Sainburg, DO   62.5 mcg at 11/29/16 8119   ??? docusate (COLACE) 50 mg/5 mL liquid 100 mg  100 mg G-tube BID Clarisse Gouge Sainburg, DO   100 mg at 11/26/16 0947   ??? insulin NPH (HumuLIN,NovoLIN) injection 6 Units  6 Units Subcutaneous BID Murvin Donning, MD   6 Units at 11/29/16 [phone]   ??? insulin regular (HumuLIN,NovoLIN) injection 0-12 Units  0-12 Units Subcutaneous [facility] Ramani Riva Mirian Capuch[place], [place]   2 Units at 11/29/16 2956   ??? insulin regular (HumuLIN,NovoLIN) injection 4 Units  4 Units Subcutaneous Q6H [facility] Murvin Donning, MD   4 Units at 11/28/16 1759   ??? lidocaine (LIDODERM) 5 % patch 1 patch  1 patch Transdermal Daily Delane Ginger, DO   1 patch at 11/29/16 2130   ??? melatonin tablet 3 mg  3 mg PEG Tube Nightly Delane Ginger, DO   3 mg at 11/28/16 2211   ??? mycophenolate (CELLCEPT) 200 mg/mL suspension 500 mg  500 mg G-tube BID Trellis Paganini [place], [place]   500 mg at 11/29/16 8657   ??? ocular lubricant (GENTEAL SEVERE) 0.3 % ophthalmic gel 1 drop  1 drop Both Eyes TID PRN Delane Ginger, DO       ??? ondansetron (ZOFRAN-ODT) disintegrating tablet 4 mg  4 mg Oral Q8H PRN Murvin Donning, MD   4 mg at 11/29/16 0215   ??? pantoprazole (PROTONIX) granules 40 mg  40 mg G-tube BID Clarisse Gouge Sainburg, DO   40 mg at 11/29/16 0831   ??? polyethylene glycol (MIRALAX) packet 17 g  17 g G-tube BID Clarisse Gouge Sainburg, DO   17 g at 11/26/16 0949   ??? saliva stimulant mucosal spray  1 spray Topical Q2H PRN Delane Ginger, DO       ??? sildenafil (antihypertensive) (REVATIO) 10 mg/mL oral suspension 30 mg  30 mg PEG Tube [facility] Daniel A Sainburg, DO   30 mg at 11/29/16 8469   ??? sulfamethoxazole-trimethoprim (BACTRIM,SEPTRA) 200-40 mg/5 mL suspension 10 mL  10 mL PEG Tube Once per day on Mon Wed Fri Clarisse Gouge Sainburg, DO   10 mL at 11/28/16 6295   ??? thiamine (B-1) tablet 100 mg  100 mg PEG Tube Daily Clarisse Gouge Sainburg, DO   100 mg at 11/29/16 0831   ??? traMADol (ULTRAM) tablet 50 mg  50 mg PEG Tube Q4H PRN Delane Ginger, DO   50 mg at 11/29/16 1015   ??? valGANciclovir (VALCYTE) 50 mg/mL oral solution 450 mg  450 mg PEG Tube Daily Clarisse Gouge Sainburg, DO   450 mg at 11/29/16 2841   ??? warfarin (COUMADIN) tablet 3 mg  3 mg PEG Tube Daily Clarisse Gouge Sainburg, DO   3 mg at 11/28/16 1759     Review of Systems:    Unable to complete due to patient  factors.    Objective: :    Patient Vitals for the past 8 hrs:   BP Temp Temp src Pulse Resp SpO2   11/29/16 0838 - 36.2 ??C (97.1 ??F) Oral - - -   11/29/16 0500 146/71 38.2 ??C (100.8 ??F) Oral 86 18 95 %       No intake/output data recorded.    Physical Exam:  General appearance -lying in bed, eyes closed  PULM: normal effort  SKIN: warm and dry    Test Results    Glucose POC all reviewed.        Data Review  Lab Results   Component Value Date    A1C 8.0 (H) 09/14/2016    A1C 7.8 (H) 09/05/2016    A1C 8.5 (H) 07/20/2016     Lab Results   Component Value Date    GFR >= 60 02/15/2012    CREATININE 0.93 11/29/2016

## 2016-11-29 NOTE — Unmapped (Signed)
Ordering MD needs to contact EP Services at [phone] for interrogation of Cardiac Device, or if outside of normal clinic hours, contact Cardiology Fellow for consult to evaluate if MRI exam can be performed.      In addition to the Interrogation of the Patient's Device , in order to follow MRI Pacemaker Safety Protocol, we need the following placed as a note in EPIC:           I, ___________________________(referring physician's name),     am ordering a _______________________________MRI for     ______________________________(patient name and MRN).     I have determined that this MRI is clinically-indicated without acceptable alternative imaging techniques available and the results of this MRI will impact patient care. ??I have discussed this with the patient and the patient agrees to undergo the MRI.       Thank You,    MRI Technologist    Please feel free to call the MRI Department [phone] or MRI Supervisor, Clif Stallings, with questions.    ??FOR EXAMPLE:   ??  I, _NAME OF ORDERING PROVIDER_am ordering a _TYPE OF MRI EXAM_for PT???s NAME. The MRI is to be carried out in Radiology in accordance with the guidelines for MRI conditional devices in accordance with the manufacturer???s guidelines.??I have determined that this cardiac MRI is clinically-indicated without acceptable alternative imaging techniques available and the results of this MRI will impact patient care.?? I have discussed this with the patient and the patient agrees to undergo the MRI.

## 2016-11-29 NOTE — Unmapped (Signed)
Adult Nutrition Follow Up    Pt is a 66 yo male s/p liver transplant 7/5    ASSESSMENT  Events since Last RD Visit: Pt now in AIR. NPO w/ TFs off for ERCP 2/2 elevated LFTs. Prior to NPO, PO diet advanced to Dysphagia 3 w/ honey-thick liquids following MBSS on 9/17. Concern from team that pt will not sit upright during meals and remain upright for 30 minutes afterward per SLP recommendation. Continues on bolus feeds of Jevity 1.5 to meet 100% of needs. Out of room in procedure at time of visit.     Labs: Reviewed; serum glucose 258, glucose POC 165-265 x 24 hrs  Meds: Reviewed; insulin regimen, bowel regimen, thiamine  Abd/GI: last BM 9/18 x 2  Skin: see below  Patient Lines/Drains/Airways Status    Active Wounds     None                   Nutrition Orders            Start     Ordered    11/29/16 0001  NPO Sips and chips; Procedure/Test; Stop Tube Feeding  Effective midnight     Comments:  OK FOR ICE CHIPS FOR COMFORT AFTER ORAL CARE IS PERFORMED   Question Answer Comment   NPO Except: Sips and chips    Reason: Procedure/Test    If pt on tube feeding (i.e., Adult Enteral, Pediatric Enteral, Infant Formula & Human Milk) Stop Tube Feeding        11/28/16 1609    11/24/16 2114  Adult Enteral Nutrition Jevity 1.5 (Standard 1.5 Cal w/ Fiber)  Effective now     Comments:  --- start with 120 mL, run over pump x 1 hr   --- increase bolus by 30 mL q4 hrs   --- final goal: 1 can (237 mL) 5x daily (6a, 10a, 2p, 6p, 10p)   Question Answer Comment   Feeding Route: G Tube    Enteral Nutrition Bolus (mL): 120    Enteral Nutrition Bolus frequency: 5x day (increase bolus by 30cc q4hr)    Enteral Nutrition Bolus Final Goal (mL): 237    Feeding Tube Flush (mL) 50 mL    Feeding Tube Flush Type: Water    Feeding Tube Flush Frequency: Before and after each bolus    Enteral Nutrition Formula: Jevity 1.5 (Standard 1.5 Cal w/ Fiber)        11/24/16 2113        Anthropometric Data:  -- Height:     Ht Readings from Last 1 Encounters: 11/20/16 157 cm (5' 1.81)   -- Last recorded weight: 68.4 kg (150 lb 12.7 oz)  -- Admission weight: 76.7 kg  -- UBW: 155 lbs (70.4 kg)  -- IBW:    -- Percent IBW: ~105%  -- AdjIBW: N/A  -- BMI: Body mass index is 27.75 kg/m??.   -- Weight changes this admission: net even since admission to rehab on 9/13  Last 5 Recorded Weights    11/24/16 2100   Weight: 68.4 kg (150 lb 12.7 oz)      -- Weight history PTA: per EPIC wt hx, pt has experienced ~8.4% wt loss x ~6 months. Wife reports loss some was intentional after changing dietary behaviors.  Wt Readings from Last 10 Encounters:   11/24/16 68.4 kg (150 lb 12.7 oz)   11/22/16 67.8 kg (149 lb 7.6 oz)   10/08/16 73.5 kg (162 lb 0.6 oz)   09/06/16 75.2 kg (  165 lb 12.6 oz)   08/02/16 71.1 kg (156 lb 12.8 oz)   07/27/16 70.2 kg (154 lb 11.2 oz)   07/05/16 71.7 kg (158 lb)   05/31/16 76 kg (167 lb 8 oz)   05/26/16 76.2 kg (167 lb 15.9 oz)   05/26/16 75.3 kg (166 lb)     Nutrition Focused Physical Exam:   unable to complete NFPE 2/2 pt unavailable                         Estimated Nutrition Needs:   Estimated needs adjusted based on most recent weight  1695-2034 kcal/day (25-30 kcal/kg actual BW)  82-103 g protein/day (1.2-1.5 g/kg actual BW)  <45% total calories from CHO  Fluids per MD    Nutrition Assessment: pt continues to benefit from TFs to meet ~100% of needs. Agree with continuous feeds in order to assess tolerance via PEG. Given low K+, suggest modifying formula to Jevity 1.5. Pt previously tolerated this formula and was only changed to Nepro 2/2 AKI, hyperkalemia.     Patient would benefit from nutrition education on food safety post-transplant.      ASPEN/AND Malnutrition Screening:  Malnutrition Assessment using AND/ASPEN Clinical Characteristics:   unable to determine (pending repeat NFPE)                      Nutrition Diagnosis:   Inadequate oral intake related to peri-op period as evidenced by NPO - ongoing  Food and nutrition-related knowledge deficit as related to new liver transplant as evidenced by need for nutrition education prior to discharge - ongoing    Nutrition Goals: diet advancement - met, basic understanding of nutrition education topic principle of food safety post liver transplant - not met, tube feeds at goal w/in 1 week of ICU stay - met, weight maintenance - ongoing    Nutrition Action/Recommendations/Interventions:   1. Monitor NPO status, nutrition therapy advancement and tolerance  --- ADAT w/ texture and consistency per SLP  2. Continue EN regimen of Standard 1.5 w/ fiber (Jevity 1.5)   --- 1 can (237 mL) 5x daily (6a, 10a, 2p, 6p, 10p)  --- At goal, order will provide 1185 mL, 1777 kcal, 76 g PRO,  255 g CHO, 900 mL free water  3. Will educate pt and caregiver on food safety prior to discharge home  4. Request twice weekly weights    Follow-up: 1-2 times per week (and more frequent as indicated)     Jackqulyn Livings MPH, RD, LDN  Pager: [phone]

## 2016-11-29 NOTE — Unmapped (Signed)
Patient is followed by [facility] EP. Patient has a MRI conditional pacemaker and is clear for MRI scan. ??    GENERATOR INFORMATION  Manufacter & Model #  Medtronic [id number] Micra VR TCP-Leadless Pacemaker    LEAD INFORMATION  Abandoned/Epicardical Lead(s) ?? No     TESTING:    Sensing (mV):   RV: 16.1    Capture Threshold (V@_ms ):   RV: 0.5@0 .24ms  Impedance (between 200-1500ohms):   RV: 60    MR CONDITIONAL STATUS AND MANAGEMENT    MR CONDITIONAL SYSTEM ??  (Including Generator and all leads)  Risks and Potential Complications discussed with patient and patient consents to procedure  System has been implanted more than 6 weeks? Yes   (If no, is the MRI considered clinically useful based on assessment of risk and benefit for that patient?) Yes or No  Pacing-Dependent: ??No     Underlying rhythm: Sinus    Programming Changes During MRI    Pacemaker Dependent: ??NO  CIED will be programmed to surescan mode    Follow-up advised in 3-6 months after MRI unless earlier follow-up (within a week) is indicated for the following: ??Any capture threshold increase >1.0 V, sensing drop >50%, pacing impedance change >50 ohms, or shock impedance change >5 ohms    PLEASE PLACE ORDER IN EPIC FOR PERIOPERATIVE REPROGRAMMING OF PACEMAKER OR ICD

## 2016-11-30 LAB — PROTIME: Lab: 53.7 — ABNORMAL HIGH

## 2016-11-30 LAB — COMPREHENSIVE METABOLIC PANEL
ALBUMIN: 2.4 g/dL — ABNORMAL LOW (ref 3.5–5.0)
ALKALINE PHOSPHATASE: 454 U/L — ABNORMAL HIGH (ref 38–126)
ANION GAP: 5 mmol/L — ABNORMAL LOW (ref 9–15)
AST (SGOT): 51 U/L (ref 19–55)
BILIRUBIN TOTAL: 1.5 mg/dL — ABNORMAL HIGH (ref 0.0–1.2)
BLOOD UREA NITROGEN: 20 mg/dL (ref 7–21)
BUN / CREAT RATIO: 18
CALCIUM: 8.3 mg/dL — ABNORMAL LOW (ref 8.5–10.2)
CHLORIDE: 97 mmol/L — ABNORMAL LOW (ref 98–107)
CO2: 32 mmol/L — ABNORMAL HIGH (ref 22.0–30.0)
CREATININE: 1.13 mg/dL (ref 0.70–1.30)
EGFR MDRD AF AMER: >=60 mL/min/{1.73_m2} (ref >=60)
EGFR MDRD NON AF AMER: >=60 mL/min/{1.73_m2} (ref >=60)
GLUCOSE RANDOM: 223 mg/dL — ABNORMAL HIGH (ref 65–179)
POTASSIUM: 4.4 mmol/L (ref 3.5–5.0)
PROTEIN TOTAL: 4.5 g/dL — ABNORMAL LOW (ref 6.5–8.3)

## 2016-11-30 LAB — CYCLOSPORINE (FPIA) BLOOD: Lab: 211

## 2016-11-30 LAB — ALT (SGPT): Alanine aminotransferase:CCnc:Pt:Ser/Plas:Qn:: 205 — ABNORMAL HIGH

## 2016-11-30 NOTE — Unmapped (Signed)
Transplant Surgery Progress Note    Hospital Day: 7    Assessment:     Jeffrey Ward??is a 66 y.o.??male??with a history of IDDM, GERD, prior perioperative a-fib with RVR and 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 Post-op course complicated by decompensation secondary to acute right heart failure, AKI requiring CRRT (resolved), severe pulmonary hypertension responsive to sildenafil, bradycardia/ asystole requiring pacemaker placement, atrial fibrilliation/ narrow complex tachycardia requiring anti-coagulation with warfarin and amiodarone/ digoxin, malnutrition/ debility, and altered mental status/ delirium improved with transition to cyclosporine and psychiatric medications. On 11/03/16 he had a significant spike in his LFTs s/p biopsy 8/24 negative for rejection. He continued to recover and was transferred to floor status 8/28. The patient was transferred back to the SICU 9/9 2/2 GI bleed following PEG placement. Once resuscitated and stabilized, he was transferred back to the floor 9/10. He was evaluated by AIR 9/13 and accepted for admission.     LFTs were elevated on 9/14 and downtrending today. Liver transplant U/S performed 9/14- RIs stable, vessels patent, mild increased dilation of CBD. GI was consulted for ERCP.       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 Pt tolerated the procedure well - NAEO. Pain well controlled. Resting comfortably, but arousable. Tolerated diet last evening.    Objective: Vital Signs:  BP 136/60  - Pulse 68  - Temp 36.5 ??C (Oral)  - Resp 17  - Wt 68.4 kg (150 lb 12.7 oz)  - SpO2 98%  - BMI 27.75 kg/m??     Input/Output:  I/O last 3 completed shifts:  In: 837 [I.V.:500; NG/GT:337]  Out: -     Physical Exam:  General: No acute distress, lying in bed  Neuro: Awake and interactive  Lungs: Normal work of breathing on room air, decannulation site healing well    Abdomen: Soft, non-tender, non-distended, incision well healed    Lab Results   Component Value Date    WBC 1.2 (L) 11/28/2016    HGB 9.0 (L) 11/28/2016    HCT 26.1 (L) 11/28/2016    PLT 117 (L) 11/28/2016       Lab Results   Component Value Date    NA 134 (L) 11/30/2016    K 4.4 11/30/2016    CL 97 (L) 11/30/2016    CO2 32.0 (H) 11/30/2016    BUN 20 11/30/2016    CREATININE 1.13 11/30/2016    CALCIUM 8.3 (L) 11/30/2016    MG 2.0 11/24/2016    PHOS 3.2 11/24/2016     Lab Results   Component Value Date    ALKPHOS 454 (H) 11/30/2016    BILITOT 1.5 (H) 11/30/2016    BILIDIR 1.20 (H) 11/24/2016    PROT 4.5 (L) 11/30/2016    ALBUMIN 2.4 (L) 11/30/2016    ALT 205 (H) 11/30/2016    AST 51 11/30/2016    GGT 261 (H) 11/24/2016  Imaging:  ERCP reviewed.

## 2016-11-30 NOTE — Unmapped (Signed)
Notified that pt approved for liquid valcyte manufacturer assist through [place]. Spoke with wife today and gave her # to call [employer] to set up home delivery for pt's liquid valcyte. Wife wrote down [phone] and stated she will call. Also saw pt in rehab today-offered support and encouragement.  Caryl Ada Inpatient Transplant Nurse Cordinator 11/30/2016 2:10 PM

## 2016-11-30 NOTE — Unmapped (Signed)
Endocrinology Consult Note    Requesting Attending Physician :  Merrily Brittle, DO  Service Requesting Consult : Physical Medicine and Rehabilitation [facility])  Primary Care Provider: [name], MD  Outpatient Endocrinologist: N/A    Assessment/Recommendations:    66 yo male with h/o DM2 who s/p liver transplant. Endo consulted for management of DM in the setting of recent initiation of prednisone and tacrolimus and now on 14h tube feeds.     1. Type 2 diabetes, uncontrolled:   * continue  NPH 6u BID (do not hold if NPO). Previously hypoglycemic on 8 units when NPO.   -revise regular 4 units q6h to 4 units at 0600, 1200 and 1800 (HOLD IF NPO-duration intended to cover po intake and bolus feeds).  - continue the senstive regular scale q6h for now. As po improves may be able to revise to lispro achs.    -Advise of any changes to steroid dose or to diet    - utilized the insulin subcutaneous management order set so administration instruction can help quide correct administration when NPO. Added in capital letters when to hold and not hold insulin.     2. Nutrition:  -on soft, honey thickened liquid diet and bolus feeds  EN regimen of Standard 1.5 w/ fiber (Jevity 1.5)   --- 1 can (237 mL) 5x daily (6a, 10a, 2p, 6p, 10p)  --- At goal, order will provide 1185 mL, 1777 kcal, 76 g PRO, 255 g CHO, 900 mL free water      3. Potential for amio induced thyroid dysfunction  Amio started 8/14  -Baseline TSH checked: 4.0  -Check TFT q6 months      Thank you for the consult. We will continue to follow patient as needed.  Discussed with team and orders revised. Please call/page [phone] with questions or concerns.         History of Present Illness: :    Reason for Consult:   DM2, exacerbated by steroids and calcineurin inhibitor    Jeffrey Ward is a 66 y.o. male w/ h/o DM2, Afib, pulmonary HTN, HCV cirrhosis admitted for <principal problem not specified> on 7/5. I have been asked to evaluate Jeffrey Ward for DM management. Post-op course was complicated by volume overload thought to be due to CHF exacerbation, respiratory failure now trached and on vent, and AKI requiring CRRT. A1c was 8.0 prior to surgery. He is on prednisone 2.5 mg qd since surgery, along with tacrolimus. He was transitioned from insulin gtt to sq POD1, while still NPO. NPH 15u BID achieved BG 130-200 at that time. However, once he started tube feeds, glucoses rose to 300s so he was switched back to insulin gtt on 7/18. He is currently on continuous tube feeds at goal rate, comprising 324 gm CHO per day.     Interval history: BG variable. Multiple doses held. Pt is now on bolus feeds and po. Refused bolus feeds this morning but ordered breakfast.     All Medications:     Current Facility-Administered Medications   Medication Dose Route Frequency Provider Last Rate Last Dose   ??? acetaminophen (TYLENOL) solution 650 mg  650 mg PEG Tube Q4H PRN Delane Ginger, DO   650 mg at 11/29/16 0447   ??? albuterol 2.5 mg /3 mL (0.083 %) nebulizer solution 2.5 mg  2.5 mg Nebulization Q6H PRN Delane Ginger, DO   2.5 mg at 11/25/16 1116   ??? amiodarone (PACERONE) tablet 200 mg  200 mg PEG  Tube Daily Daniel A Sainburg, DO   200 mg at 11/30/16 0840   ??? cycloSPORINE (NEORAL) microemulsion solution 225 mg  225 mg PEG Tube BID Particia Nearing, MD   225 mg at 11/30/16 0840   ??? dextrose 50 % in water (D50W) solution 12.5 g  12.5 g Intravenous Q10 Min PRN Murvin Donning, MD       ??? digoxin (LANOXIN) tablet 62.5 mcg  62.5 mcg PEG Tube Daily Clarisse Gouge Sainburg, DO   62.5 mcg at 11/30/16 0840   ??? docusate (COLACE) 50 mg/5 mL liquid 100 mg  100 mg G-tube BID Delane Ginger, DO   100 mg at 11/26/16 0947   ??? insulin NPH (HumuLIN,NovoLIN) injection 6 Units  6 Units Subcutaneous BID Murvin Donning, MD   6 Units at 11/29/16 1727   ??? insulin regular (HumuLIN,NovoLIN) injection 0-12 Units  0-12 Units Subcutaneous [facility] Florabel Faulks Mirian [place], [place]   1 Units at 11/29/16 1727   ??? insulin regular (HumuLIN,NovoLIN) injection 4 Units  4 Units Subcutaneous Q6H [facility] Murvin Donning, MD   4 Units at 11/29/16 1726   ??? lidocaine (LIDODERM) 5 % patch 1 patch  1 patch Transdermal Daily Delane Ginger, DO   1 patch at 11/29/16 1610   ??? melatonin tablet 3 mg  3 mg PEG Tube Nightly Delane Ginger, DO   3 mg at 11/29/16 2152   ??? mycophenolate (CELLCEPT) 200 mg/mL suspension 500 mg  500 mg G-tube BID Trellis Paganini [place], [place]   500 mg at 11/30/16 0840   ??? ocular lubricant (GENTEAL SEVERE) 0.3 % ophthalmic gel 1 drop  1 drop Both Eyes TID PRN Delane Ginger, DO       ??? ondansetron (ZOFRAN-ODT) disintegrating tablet 4 mg  4 mg Oral Q8H PRN Murvin Donning, MD   4 mg at 11/29/16 0215   ??? pantoprazole (PROTONIX) granules 40 mg  40 mg G-tube BID Clarisse Gouge Sainburg, DO   40 mg at 11/30/16 9604   ??? piperacillin-tazobactam (ZOSYN) IVPB (premix) 3.375 g  3.375 g Intravenous Q8H Daniel A Sainburg, DO 100 mL/hr at 11/30/16 0403 3.375 g at 11/30/16 0403   ??? polyethylene glycol (MIRALAX) packet 17 g  17 g G-tube BID Clarisse Gouge Sainburg, DO   17 g at 11/26/16 0949   ??? saliva stimulant mucosal spray  1 spray Topical Q2H PRN Delane Ginger, DO       ??? sildenafil (antihypertensive) (REVATIO) 10 mg/mL oral suspension 30 mg  30 mg PEG Tube [facility] Daniel A Sainburg, DO   30 mg at 11/30/16 0537   ??? sulfamethoxazole-trimethoprim (BACTRIM,SEPTRA) 200-40 mg/5 mL suspension 10 mL  10 mL PEG Tube Once per day on Mon Wed Fri Clarisse Gouge Sainburg, DO   10 mL at 11/30/16 0840   ??? thiamine (B-1) tablet 100 mg  100 mg PEG Tube Daily Daniel A Sainburg, DO   100 mg at 11/30/16 0840   ??? traMADol (ULTRAM) tablet 50 mg  50 mg PEG Tube Q4H PRN Delane Ginger, DO   50 mg at 11/30/16 0542   ??? valGANciclovir (VALCYTE) 50 mg/mL oral solution 450 mg  450 mg PEG Tube Daily Daniel A Sainburg, DO   450 mg at 11/30/16 0840   ??? warfarin (COUMADIN) tablet 3 mg  3 mg PEG Tube Daily Clarisse Gouge Sainburg, DO   3 mg at 11/29/16 1727     Review of Systems:  Unable to complete due to patient factors.    Objective: :    Patient Vitals for the past 8 hrs:   BP Temp Temp src Pulse Resp   11/30/16 0500 136/60 36.5 ??C (97.7 ??F) Oral 64 17       No intake/output data recorded.    Physical Exam:  General appearance -lying in bed, NAD noted  PULM: normal effort  SKIN: warm and dry  PSYCH: calm, cooperative    Test Results    Glucose POC all reviewed.        Data Review  Lab Results   Component Value Date    A1C 8.0 (H) 09/14/2016    A1C 7.8 (H) 09/05/2016    A1C 8.5 (H) 07/20/2016     Lab Results   Component Value Date    GFR >= 60 02/15/2012    CREATININE 1.13 11/30/2016

## 2016-11-30 NOTE — Unmapped (Signed)
Brief Inpatient Consult Note         Recommendations          Jeffrey Ward is a 66 y.o. 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 Fib, bradycardia with pacemaker, DM, and right heart failure on whom we have initially been asked by Merrily Brittle, DO to consult for lower extremity weakness.    Please refer to last neurology consult of 11/25/2016 for detailed assessment.  Pt was not seen nor examined by our team today.    RECOMMENDATIONS:   1. Previously recommended MRI of the lumbar spine without contrast which is still pending at this time.  2. We will sign off for now. Please call us back once the above imaging study has been done for our review.    This patient was discussed with Dr. Hoyle Barr who agrees to the above assessment and plan.    Adelie Croswell A. Beatris Si, M.D.  Riki Sheer, Neurology

## 2016-11-30 NOTE — Unmapped (Signed)
Problem: Patient Care Overview  Goal: Plan of Care Review  Outcome: Progressing  Pt is A&O.  Dysphagia 3 diet, bolus tube feeds. Continent of bowel and bladder.  No signs of skin breakdown.  No c/o pain so far this shift. Skin and safety protocol maintained.  Bed in lowest position, call bell within reach, wheels locked.  Will continue to monitor.    11/25/16 1138   OTHER   Plan of Care Reviewed With patient;spouse   Plan of Care Review   Progress improving       Problem: Self-Care Deficit (Adult,Obstetrics,Pediatric)  Goal: Identify Related Risk Factors and Signs and Symptoms  Related risk factors and signs and symptoms are identified upon initiation of Human Response Clinical Practice Guideline (CPG).   Outcome: Progressing   11/25/16 1138   Self-Care Deficit (Adult,Obstetrics,Pediatric)   Related Risk Factors (Self-Care Deficit) activity intolerance;disease process;medication effects;surgery;pain/discomfort   Signs and Symptoms (Self-Care Deficit) decreased balance;gait unstable;weakness, paresis, paralysis     Goal: Improved Ability to Perform BADL and IADL  Patient will demonstrate the desired outcomes by discharge/transition of care.   Outcome: Progressing   11/30/16 0023   Self-Care Deficit (Adult,Obstetrics,Pediatric)   Improved Ability to Perform BADL and IADL making progress toward outcome       Problem: Fall Risk (Adult)  Goal: Identify Related Risk Factors and Signs and Symptoms  Related risk factors and signs and symptoms are identified upon initiation of Human Response Clinical Practice Guideline (CPG).   Outcome: Progressing   11/25/16 1138   Fall Risk (Adult)   Related Risk Factors (Fall Risk) fatigue/slow reaction;gait/mobility problems;polypharmacy;objects hard to reach   Signs and Symptoms (Fall Risk) presence of risk factors     Goal: Absence of Fall  Patient will demonstrate the desired outcomes by discharge/transition of care.   Outcome: Progressing   11/30/16 0023   Fall Risk (Adult)   Absence of Fall making progress toward outcome       Problem: Activity Intolerance (Adult)  Goal: Identify Related Risk Factors and Signs and Symptoms  Related risk factors and signs and symptoms are identified upon initiation of Human Response Clinical Practice Guideline (CPG).   Outcome: Progressing   11/25/16 1138   Activity Intolerance (Adult)   Related Risk Factors (Activity Intolerance) generalized weakness;pain;medication side effects   Signs and Symptoms (Activity Intolerance) pain/discomfort;nausea     Goal: Activity Tolerance  Patient will demonstrate the desired outcomes by discharge/transition of care.   Outcome: Progressing   11/30/16 0023   Activity Intolerance (Adult)   Activity Tolerance making progress toward outcome     Goal: Effective Energy Conservation Techniques  Patient will demonstrate the desired outcomes by discharge/transition of care.   Outcome: Progressing   11/30/16 0023   Activity Intolerance (Adult)   Effective Energy Conservation Techniques making progress toward outcome       Problem: Nutrition, Imbalanced: Inadequate Oral Intake (Adult)  Goal: Identify Related Risk Factors and Signs and Symptoms  Related risk factors and signs and symptoms are identified upon initiation of Human Response Clinical Practice Guideline (CPG).   Outcome: Progressing   11/26/16 1715   Nutrition, Imbalanced: Inadequate Oral Intake (Adult)   Related Risk Factors (Nutrition Imbalance, Inadequate Oral Intake) NPO status prolonged   Signs and Symptoms (Nutrition Imbalance, Inadequate Oral Intake: Signs and Symptoms) weakness/lethargy     Goal: Improved Oral Intake  Patient will demonstrate the desired outcomes by discharge/transition of care.   Outcome: Progressing   11/30/16 0023   Nutrition, Imbalanced:  Inadequate Oral Intake (Adult)   Improved Oral Intake making progress toward outcome     Goal: Prevent Further Weight Loss  Patient will demonstrate the desired outcomes by discharge/transition of care.   Outcome: Progressing   11/30/16 0023   Nutrition, Imbalanced: Inadequate Oral Intake (Adult)   Prevent Further Weight Loss making progress toward outcome       Problem: Infection, Risk/Actual (Adult)  Goal: Identify Related Risk Factors and Signs and Symptoms  Related risk factors and signs and symptoms are identified upon initiation of Human Response Clinical Practice Guideline (CPG).   Outcome: Progressing   11/25/16 1138   Infection, Risk/Actual (Adult)   Related Risk Factors (Infection, Risk/Actual) prolonged hospitalization;medication effects;surgery/procedure   Signs and Symptoms (Infection, Risk/Actual) feeding/eating intolerance;vomiting;weakness;pain     Goal: Infection Prevention/Resolution  Patient will demonstrate the desired outcomes by discharge/transition of care.   Outcome: Progressing   11/30/16 0023   Infection, Risk/Actual (Adult)   Infection Prevention/Resolution making progress toward outcome       Problem: Skin Injury Risk (Adult)  Goal: Identify Related Risk Factors and Signs and Symptoms  Related risk factors and signs and symptoms are identified upon initiation of Human Response Clinical Practice Guideline (CPG).   Outcome: Progressing   11/25/16 1138   Skin Injury Risk (Adult)   Related Risk Factors (Skin Injury Risk) cognitive impairment;hospitalization prolonged;mobility impaired     Goal: Skin Health and Integrity  Patient will demonstrate the desired outcomes by discharge/transition of care.   Outcome: Progressing   11/30/16 0023   Skin Injury Risk (Adult)   Skin Health and Integrity making progress toward outcome       Problem: VTE, DVT and PE (Adult)  Goal: Signs and Symptoms of Listed Potential Problems Will be Absent, Minimized or Managed (VTE, DVT and PE)  Signs and symptoms of listed potential problems will be absent, minimized or managed by discharge/transition of care (reference VTE, DVT and PE (Adult) CPG).   Outcome: Progressing

## 2016-11-30 NOTE — Unmapped (Signed)
Problem: Patient Care Overview  Goal: Plan of Care Review  Outcome: Progressing   11/25/16 1138   OTHER   Plan of Care Reviewed With patient;spouse   Plan of Care Review   Progress improving   Patient is A&O. Patient is continent of bowel and bladder, last BM was 11/30/16. Bolus tube feeds, Dysphagia diet, honey thick liquids. Pain relieved with PRN meds. Safety and skin protocol maintained. Bed in lowest position, call bell within reach, will continue to monitor.     Goal: Discharge Needs Assessment  Outcome: Progressing   11/25/16 0950 11/25/16 1209   OTHER   Equipment Currently Used at Home --  cane, straight;glucometer  (Used a cane occasionally. )   Patient and/or family were provided with choice of facilities / services that are available and appropriate to meet post hospital care needs? --  N/A   Discharge Needs Assessment   Concerns to be Addressed --  adjustment to diagnosis/illness;care coordination/care conferences;discharge planning   Readmission Within the Last 30 Days --  planned readmission  (Acute to AIR admission)   Transportation Anticipated family or friend will provide --    Anticipated Changes Related to Illness --  inability to care for self   Equipment Needed After Discharge --  other (see comments)  (Possibly oxygen, will try to wean patient. TBD regarding other HME)   Discharge Facility/Level of Care Needs --  other (see comments)  (TBD)       Problem: Self-Care Deficit (Adult,Obstetrics,Pediatric)  Goal: Identify Related Risk Factors and Signs and Symptoms  Related risk factors and signs and symptoms are identified upon initiation of Human Response Clinical Practice Guideline (CPG).   Outcome: Progressing   11/25/16 1138   Self-Care Deficit (Adult,Obstetrics,Pediatric)   Related Risk Factors (Self-Care Deficit) activity intolerance;disease process;medication effects;surgery;pain/discomfort   Signs and Symptoms (Self-Care Deficit) decreased balance;gait unstable;weakness, paresis, paralysis Goal: Improved Ability to Perform BADL and IADL  Patient will demonstrate the desired outcomes by discharge/transition of care.   Outcome: Progressing   11/30/16 1444   Self-Care Deficit (Adult,Obstetrics,Pediatric)   Improved Ability to Perform BADL and IADL making progress toward outcome       Problem: Fall Risk (Adult)  Goal: Identify Related Risk Factors and Signs and Symptoms  Related risk factors and signs and symptoms are identified upon initiation of Human Response Clinical Practice Guideline (CPG).   Outcome: Progressing   11/25/16 1138   Fall Risk (Adult)   Related Risk Factors (Fall Risk) fatigue/slow reaction;gait/mobility problems;polypharmacy;objects hard to reach   Signs and Symptoms (Fall Risk) presence of risk factors     Goal: Absence of Fall  Patient will demonstrate the desired outcomes by discharge/transition of care.   Outcome: Progressing   11/30/16 1444   Fall Risk (Adult)   Absence of Fall making progress toward outcome       Problem: Activity Intolerance (Adult)  Goal: Identify Related Risk Factors and Signs and Symptoms  Related risk factors and signs and symptoms are identified upon initiation of Human Response Clinical Practice Guideline (CPG).   Outcome: Progressing   11/25/16 1138   Activity Intolerance (Adult)   Related Risk Factors (Activity Intolerance) generalized weakness;pain;medication side effects   Signs and Symptoms (Activity Intolerance) pain/discomfort;nausea     Goal: Activity Tolerance  Patient will demonstrate the desired outcomes by discharge/transition of care.   Outcome: Progressing   11/30/16 1444   Activity Intolerance (Adult)   Activity Tolerance making progress toward outcome     Goal: Effective Energy  Conservation Techniques  Patient will demonstrate the desired outcomes by discharge/transition of care.   Outcome: Progressing   11/30/16 1444   Activity Intolerance (Adult)   Effective Energy Conservation Techniques making progress toward outcome       Problem: Nutrition, Imbalanced: Inadequate Oral Intake (Adult)  Goal: Identify Related Risk Factors and Signs and Symptoms  Related risk factors and signs and symptoms are identified upon initiation of Human Response Clinical Practice Guideline (CPG).   Outcome: Progressing   11/26/16 1715 11/30/16 1444   Nutrition, Imbalanced: Inadequate Oral Intake (Adult)   Related Risk Factors (Nutrition Imbalance, Inadequate Oral Intake) --  eating aversion   Signs and Symptoms (Nutrition Imbalance, Inadequate Oral Intake: Signs and Symptoms) weakness/lethargy --      Goal: Improved Oral Intake  Patient will demonstrate the desired outcomes by discharge/transition of care.   Outcome: Progressing   11/30/16 1444   Nutrition, Imbalanced: Inadequate Oral Intake (Adult)   Improved Oral Intake making progress toward outcome     Goal: Prevent Further Weight Loss  Patient will demonstrate the desired outcomes by discharge/transition of care.   Outcome: Progressing   11/30/16 1444   Nutrition, Imbalanced: Inadequate Oral Intake (Adult)   Prevent Further Weight Loss making progress toward outcome       Problem: Infection, Risk/Actual (Adult)  Goal: Identify Related Risk Factors and Signs and Symptoms  Related risk factors and signs and symptoms are identified upon initiation of Human Response Clinical Practice Guideline (CPG).   Outcome: Progressing   11/25/16 1138   Infection, Risk/Actual (Adult)   Related Risk Factors (Infection, Risk/Actual) prolonged hospitalization;medication effects;surgery/procedure   Signs and Symptoms (Infection, Risk/Actual) feeding/eating intolerance;vomiting;weakness;pain     Goal: Infection Prevention/Resolution  Patient will demonstrate the desired outcomes by discharge/transition of care.   Outcome: Progressing   11/30/16 1444   Infection, Risk/Actual (Adult)   Infection Prevention/Resolution making progress toward outcome       Problem: Skin Injury Risk (Adult)  Goal: Identify Related Risk Factors and Signs and Symptoms  Related risk factors and signs and symptoms are identified upon initiation of Human Response Clinical Practice Guideline (CPG).   Outcome: Progressing   11/25/16 1138   Skin Injury Risk (Adult)   Related Risk Factors (Skin Injury Risk) cognitive impairment;hospitalization prolonged;mobility impaired     Goal: Skin Health and Integrity  Patient will demonstrate the desired outcomes by discharge/transition of care.   Outcome: Progressing   11/30/16 1444   Skin Injury Risk (Adult)   Skin Health and Integrity making progress toward outcome       Problem: VTE, DVT and PE (Adult)  Goal: Signs and Symptoms of Listed Potential Problems Will be Absent, Minimized or Managed (VTE, DVT and PE)  Signs and symptoms of listed potential problems will be absent, minimized or managed by discharge/transition of care (reference VTE, DVT and PE (Adult) CPG).   Outcome: Progressing   11/30/16 1444   VTE, DVT and PE (Adult)   Problems Assessed (VTE, DVT, PE) all   Problems Present (VTE, DVT, PE) none

## 2016-11-30 NOTE — Unmapped (Signed)
Order was placed for PIV by Venous Access Team (VAT).  Patient was assessed for placement of a PIV. Access was obtained. Blood return noted.  Dressing intact and device well secured.  Flushed with normal saline.  Pt advised to inform RN of any s/s of discomfort at the PIV site.    Workup / Procedure Time:  15 minutes      The primary RN was notified.       Thank you,     Oris Drone RN Venous Access Team

## 2016-11-30 NOTE — Unmapped (Signed)
Physical Medicine and Rehabilitation  Daily Progress Note [facility]    ASSESSMENT:     ??Jeffrey Ward??is a 66 y.o.??male??admitted to [facility] for physical rehabilitation s/p liver transplant (09/15/16).  ??  Primary Rehab diagnosis: (Debility) 16 Debility (Non-Cardiac/Non-Pulmonary)    PLAN:     REHAB:   - PT and OT to maximize functional status with mobility and ADLs as well as prevention of joint contracture.   - SLP for cognitive and swallow function.  - RT for community re-integration, education, and leisure support services.  - Pharmacy consult for patient and family education on medication management.   - To be discussed in weekly Interdisciplinary Team Conference.  ??  S/p Liver transplant (09/15/16) For chronic hep C and HCC: Liver ultrasound on 11/03/16 showing mild to moderate intrahepatic biliary duct dilation which appeared to be new from prior ultrasound. Liver biopsy on 11/04/16 did not show acute rejection.The patient had significantly worsening LFTs on 11/25/16 And transplant ultrasound was performed showing RIs stable, vessels patent, mild increased dilation of CBD. ERCP was performed by GI at the recommendation of transplant on 11/29/16 and 2 stents were placed. Biopsy was obtained. The patient's LFTs have trended down as of 11/30/16.   - f/u transplant recs  - Continue cyclosporine, CellCept  - continue Valcyte, Bactrim for ppx  - CTM LFTs on daily CMP  ??  GIB: Patient had GI bleed from an ulcer which was clipped on 11/20/16 by gastroenterology.   - Protonix 40 mg BID  ??  Paroxismal A-fib: Patient on digoxin, amiodarone, warfarin for a-fib. Patient recently had GI bleed and warfarin was restarted on 9/11. INR was 1.69 on 11/24/16. Goal INR is 2-3. The patient's INR was 4.71 on 11/30/16 this is likely due to an interaction of the warfarin with Zosyn which was prescribed prophylactically after biopsy. The patient only needed 24 hours of Zosyn. Warfarin is being held at this time. Will reevaluate when to add it back tomorrow based on INR.  - Continue amiodarone, digoxin, warfarin  - follow up cardiology as outpatient  ??  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  ??  Right Heart Failure. Stable: Patient reportedly had right heart failure postop after transplant.   ??  Severe PAH: The patient's wife reports that he was told that he had pulmonary hypertension prior to admission to hospital. 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  DM:   - Endocrine following, follow-up recommendations  ??  Delirium: Patient was on Zyprexa 7.5 mg nightly which was held due to prolonged QTc. No longer having issues with delerium  - EKG today to eval QTc.  ??  Dysphonia with dysphagia: The patient has been intubated multiple times throughout his hospitalization. Voice has been hoarse/hypophonic since intubations. Will consider ENT referral if it does not improve within a few days of admission to acute inpatient rehabilitation. Will consider this after ERCP  - currently on tube feeds  - consider ENT consult   ??  Nausea/Vomiting: Patient had nausea and vomiting with tube feeds as recently as the morning of 11/24/16. This was thought to be due to high residuals. Tube feed rate was slowed. We'll continue to monitor for symptoms.  ??  Distal LE weakness/dysesthesias: On AIR admission, patient with left lower extremity weakness and numbness on the dorsal aspect of the left foot since surgery. He reported that he has developed some numbness to the right great toe.  The patient developed a spreading dysesthesias of his legs to the mid tibial area bilaterally the day following admission to rehabilitation. 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 Neurology was consulted and believed that the symptoms are consistent with a radiculopathy. They recommended MRI lumbar spine. I spoke with electrophysiology who stated that they interrogated the patient's pacemaker yesterday and he was cleared for MRI. They stated that they would come to see him just prior to MRI to put the pacemaker in MRI mode.  - Follow-up neuro recs  - MRI lumbar spine wo contrast to evaluate for possible lumosacral radiculopathy     DISPO: Patient to be discussed at weekly interdisciplinary team conference.   - ELOS: 9/26     SUBJECTIVE:     NAEON. The patient had ERCP yesterday with 2 stents placed. His LFTs improved today. We talked about the plan going forward and that we would try to get an MRI next to workup his leg numbness/tingling and weakness in the plan after that will be for possible ENT consult for his dysphonia/dysphagia. The patient was in agreement with this plan. The patient denies any new symptoms today. He is informed that his INR is up. He denies any dark stools, blood in his stool, or bleeding from anywhere else or new bruising from anywhere that he has noticed. He was informed to let us know if he notices any bleeding or bruising. He states that he is feeling well. He denies chills, chest pain, shortness of breath, nausea, vomiting. He is not currently on oxygen and states that he is breathing comfortably.    OBJECTIVE:     Vital signs (last 24 hours):  Temp:  [36.5 ??C-37.1 ??C] 36.5 ??C  Heart Rate:  [64-89] 68  Resp:  [16-26] 17  BP: (136-151)/(60-81) 136/60  MAP (mmHg):  [98-100] 100  A BP-1: (153-182)/(67-94) 180/94  MAP:  [97 mmHg-115 mmHg] 115 mmHg  SpO2:  [95 %-100 %] 98 %    Physical Exam:  GEN: NAD sitting in bed  EYES: EOMI  HENT: Hearing adequate for conversation, soft hoarse voice  RESP: NWOB on RA  CV: RRR  ABD: Nondistended  SKIN: no visible rashes   MSK: no notable contractures  NEURO: follows commands well and answers questions appropriately  PSYCH: mood euthymic, affect appropriate

## 2016-12-01 LAB — COMPREHENSIVE METABOLIC PANEL
ALBUMIN: 2.5 g/dL — ABNORMAL LOW (ref 3.5–5.0)
ALKALINE PHOSPHATASE: 350 U/L — ABNORMAL HIGH (ref 38–126)
ALT (SGPT): 140 U/L — ABNORMAL HIGH (ref 19–72)
BILIRUBIN TOTAL: 1.3 mg/dL — ABNORMAL HIGH (ref 0.0–1.2)
BLOOD UREA NITROGEN: 23 mg/dL — ABNORMAL HIGH (ref 7–21)
BUN / CREAT RATIO: 17
CALCIUM: 8.4 mg/dL — ABNORMAL LOW (ref 8.5–10.2)
CHLORIDE: 98 mmol/L (ref 98–107)
CO2: 29 mmol/L (ref 22.0–30.0)
CREATININE: 1.35 mg/dL — ABNORMAL HIGH (ref 0.70–1.30)
EGFR MDRD AF AMER: >=60 mL/min/{1.73_m2} (ref >=60)
EGFR MDRD NON AF AMER: 53 mL/min/{1.73_m2} — ABNORMAL LOW (ref >=60)
GLUCOSE RANDOM: 133 mg/dL (ref 65–179)
POTASSIUM: 4.2 mmol/L (ref 3.5–5.0)
PROTEIN TOTAL: 4.7 g/dL — ABNORMAL LOW (ref 6.5–8.3)
SODIUM: 133 mmol/L — ABNORMAL LOW (ref 135–145)

## 2016-12-01 LAB — CALCIUM: Calcium:MCnc:Pt:Ser/Plas:Qn:: 8.4 — ABNORMAL LOW

## 2016-12-01 LAB — INR: Lab: 2.9

## 2016-12-01 NOTE — Unmapped (Signed)
Physical Medicine and Rehabilitation  Daily Progress Note [facility]    ASSESSMENT:     ??Jeffrey Ward??is a 66 y.o.??male??admitted to [facility] for physical rehabilitation s/p liver transplant (09/15/16).  ??  Primary Rehab diagnosis: (Debility) 16 Debility (Non-Cardiac/Non-Pulmonary)    PLAN:     REHAB:   - PT and OT to maximize functional status with mobility and ADLs as well as prevention of joint contracture.   - SLP for cognitive and swallow function.  - RT for community re-integration, education, and leisure support services.  - Pharmacy consult for patient and family education on medication management.   - To be discussed in weekly Interdisciplinary Team Conference.  ??  S/p Liver transplant (09/15/16) For chronic hep C and HCC: Liver ultrasound on 11/03/16 showing mild to moderate intrahepatic biliary duct dilation which appeared to be new from prior ultrasound. Liver biopsy on 11/04/16 did not show acute rejection.The patient had significantly worsening LFTs on 11/25/16 And transplant ultrasound was performed showing RIs stable, vessels patent, mild increased dilation of CBD. ERCP was performed by GI at the recommendation of transplant on 11/29/16 and 2 stents were placed. Biopsy was obtained. The patient's LFTs have trended down as of 12/01/16.   - f/u transplant recs  - Continue cyclosporine, CellCept  - continue Valcyte, Bactrim for ppx  - CTM LFTs on daily CMP  ??  GIB: Patient had GI bleed from an ulcer which was clipped on 11/20/16 by gastroenterology.   - Protonix 40 mg BID  ??  Paroxismal A-fib: Patient on digoxin, amiodarone, warfarin for a-fib. Patient recently had GI bleed and warfarin was restarted on 9/11. INR was 1.69 on 11/24/16. Goal INR is 2-3. The patient's INR was 4.71 on 11/30/16 this is likely due to an interaction of the warfarin with Zosyn which was prescribed prophylactically after biopsy. The patient only needed 24 hours of Zosyn. Warfarin is being held at this time. Will reevaluate when to add it back tomorrow based on INR.  - Continue amiodarone, digoxin, warfarin  - follow up cardiology as outpatient  ??  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  ??  Right Heart Failure. Stable: Patient reportedly had right heart failure postop after transplant.   ??  Severe PAH: The patient's wife reports that he was told that he had pulmonary hypertension prior to admission to hospital. 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  DM:   - Endocrine following, follow-up recommendations  ??  Delirium: Patient was on Zyprexa 7.5 mg nightly which was held due to prolonged QTc. No longer having issues with delerium  - QTc 412 on 9/19  ??  Dysphonia with dysphagia: The patient has been intubated multiple times throughout his hospitalization. Voice has been hoarse/hypophonic since intubations. Will consider ENT referral if it does not improve within a few days of admission to acute inpatient rehabilitation. Will consider this after ERCP  - currently on tube feeds  - consider ENT consult   ??  Nausea/Vomiting: Patient had nausea and vomiting with tube feeds as recently as the morning of 11/24/16. This was thought to be due to high residuals. Tube feed rate was slowed. We'll continue to monitor for symptoms.  ??  Distal LE weakness/dysesthesias: On AIR admission, patient with left lower extremity weakness and numbness on the dorsal aspect of the left foot since surgery. He reported that he has developed some numbness to the right great toe. The  patient developed a spreading dysesthesias of his legs to the mid tibial area bilaterally the day following admission to rehabilitation. 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 Neurology was consulted and believed that the symptoms are consistent with a radiculopathy. They recommended MRI lumbar spine. I spoke with electrophysiology who stated that they interrogated the patient's pacemaker yesterday and he was cleared for MRI. They stated that they would come to see him just prior to MRI to put the pacemaker in MRI mode.  - Follow-up neuro recs  - MRI lumbar spine wo contrast to evaluate for possible lumosacral radiculopathy     DISPO: Patient to be discussed at weekly interdisciplinary team conference.   - ELOS: 9/26     SUBJECTIVE:     NAEON. The patient has no new complaints today. He states that he has a mild to moderate headache. He is not taking his PRN medications and he is reminded that they were there in case he feels like he needs them. The patient denies any chills, nausea, vomiting, diarrhea. He feels that his numbness has gotten a little bit worse. He denies any other new symptoms.    OBJECTIVE:     Vital signs (last 24 hours):  Temp:  [36.4 ??C-36.8 ??C] 36.4 ??C  Heart Rate:  [67-68] 67  Resp:  [18] 18  BP: (133-153)/(63-70) 153/70  MAP (mmHg):  [91] 91  SpO2:  [94 %-96 %] 96 %    Physical Exam:  GEN: NAD sitting in bed  EYES: EOMI  HENT: Hearing adequate for conversation, soft hoarse voice  RESP: NWOB on RA  CV: RRR  ABD: Nondistended  SKIN: no visible rashes   MSK: no notable contractures  NEURO: follows commands well and answers questions appropriately, no sensation on the left foot to his ankle with hypoesthesia to the mid tibial area. Hypoesthesia distal to the mid tibial area in the right lower extremity.   PSYCH: mood euthymic, affect appropriate

## 2016-12-01 NOTE — Unmapped (Signed)
Problem: Patient Care Overview  Goal: Plan of Care Review  Outcome: Progressing  Pt is A&O. ACHS. Dysphagia 3 diet, tube feeds x5 per day as order. Continent of bowel and bladder. ??No signs of skin breakdown. ??No c/o pain so far this shift. Skin and safety protocol maintained. ??Bed in lowest position, call bell within reach, wheels locked. ??Will continue to monitor.   Goal: Individualization and Mutuality  Outcome: Progressing    Goal: Discharge Needs Assessment  Outcome: Progressing    Goal: Interprofessional Rounds/Family Conf  Outcome: Progressing      Problem: Self-Care Deficit (Adult,Obstetrics,Pediatric)  Goal: Identify Related Risk Factors and Signs and Symptoms  Related risk factors and signs and symptoms are identified upon initiation of Human Response Clinical Practice Guideline (CPG).   Outcome: Progressing    Goal: Improved Ability to Perform BADL and IADL  Patient will demonstrate the desired outcomes by discharge/transition of care.   Outcome: Progressing      Problem: Fall Risk (Adult)  Goal: Identify Related Risk Factors and Signs and Symptoms  Related risk factors and signs and symptoms are identified upon initiation of Human Response Clinical Practice Guideline (CPG).   Outcome: Progressing    Goal: Absence of Fall  Patient will demonstrate the desired outcomes by discharge/transition of care.   Outcome: Progressing      Problem: Activity Intolerance (Adult)  Goal: Identify Related Risk Factors and Signs and Symptoms  Related risk factors and signs and symptoms are identified upon initiation of Human Response Clinical Practice Guideline (CPG).   Outcome: Progressing    Goal: Activity Tolerance  Patient will demonstrate the desired outcomes by discharge/transition of care.   Outcome: Progressing    Goal: Effective Energy Conservation Techniques  Patient will demonstrate the desired outcomes by discharge/transition of care.   Outcome: Progressing      Problem: Nutrition, Imbalanced: Inadequate Oral Intake (Adult)  Goal: Identify Related Risk Factors and Signs and Symptoms  Related risk factors and signs and symptoms are identified upon initiation of Human Response Clinical Practice Guideline (CPG).   Outcome: Progressing    Goal: Improved Oral Intake  Patient will demonstrate the desired outcomes by discharge/transition of care.   Outcome: Progressing    Goal: Prevent Further Weight Loss  Patient will demonstrate the desired outcomes by discharge/transition of care.   Outcome: Progressing      Problem: Infection, Risk/Actual (Adult)  Goal: Identify Related Risk Factors and Signs and Symptoms  Related risk factors and signs and symptoms are identified upon initiation of Human Response Clinical Practice Guideline (CPG).   Outcome: Progressing    Goal: Infection Prevention/Resolution  Patient will demonstrate the desired outcomes by discharge/transition of care.   Outcome: Progressing      Problem: Skin Injury Risk (Adult)  Goal: Identify Related Risk Factors and Signs and Symptoms  Related risk factors and signs and symptoms are identified upon initiation of Human Response Clinical Practice Guideline (CPG).   Outcome: Progressing    Goal: Skin Health and Integrity  Patient will demonstrate the desired outcomes by discharge/transition of care.   Outcome: Progressing      Problem: VTE, DVT and PE (Adult)  Goal: Signs and Symptoms of Listed Potential Problems Will be Absent, Minimized or Managed (VTE, DVT and PE)  Signs and symptoms of listed potential problems will be absent, minimized or managed by discharge/transition of care (reference VTE, DVT and PE (Adult) CPG).   Outcome: Progressing

## 2016-12-01 NOTE — Unmapped (Signed)
Transplant Surgery Progress Note    Hospital Day: 8    Assessment:     Jeffrey Wardis a 66 y.o.??male??with a history of IDDM, GERD, prior perioperative a-fib with RVR and 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 Post-op course complicated by decompensation secondary to acute right heart failure, AKI requiring CRRT (resolved), severe pulmonary hypertension responsive to sildenafil, bradycardia/ asystole requiring pacemaker placement, atrial fibrilliation/ narrow complex tachycardia requiring anti-coagulation with warfarin and amiodarone/ digoxin, malnutrition/ debility, and altered mental status/ delirium improved with transition to cyclosporine and psychiatric medications. On 11/03/16 he had a significant spike in his LFTs s/p biopsy 8/24 negative for rejection. He continued to recover and was transferred to floor status 8/28. The patient was transferred back to the SICU 9/9 2/2 GI bleed following PEG placement. Once resuscitated and stabilized, he was transferred back to the floor 9/10. He was evaluated by AIR 9/13 and accepted for admission.     LFTs were elevated on 9/14 and downtrending today. Liver transplant U/S performed 9/14- RIs stable, vessels patent, mild increased dilation of CBD. 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       Plan:       -- LFTs improving, creatinine increased today  -- Consider increasing FWF in tube feeds vs., IVF bolus  -- Continue to monitor Cyclosporine and adjust according to trough     -- Transplant surgery will continue to follow     Subjective/Interval Events:   No acute events overnight. Tolerating a dysphagia 3 diet. No abdominal pain this morning    Objective:        Vital Signs:  BP 153/70  - Pulse 67  - Temp 36.4 ??C (Oral)  - Resp 18  - Wt 68.4 kg (150 lb 12.7 oz)  - SpO2 96%  - BMI 27.75 kg/m?? Input/Output:  I/O last 3 completed shifts:  In: 577 [P.O.:240; NG/GT:337]  Out: 50 [Urine:50]    Physical Exam:  General: No acute distress, lying in bed  Neuro: Awake and interactive  Lungs: Normal work of breathing on room air, decannulation site healing well, voice stronger    Abdomen: Soft, non-tender, non-distended, incision well healed    Lab Results   Component Value Date    WBC 1.2 (L) 11/28/2016    HGB 9.0 (L) 11/28/2016    HCT 26.1 (L) 11/28/2016    PLT 117 (L) 11/28/2016       Lab Results   Component Value Date    NA 133 (L) 12/01/2016    K 4.2 12/01/2016    CL 98 12/01/2016    CO2 29.0 12/01/2016    BUN 23 (H) 12/01/2016    CREATININE 1.35 (H) 12/01/2016    CALCIUM 8.4 (L) 12/01/2016    MG 2.0 11/24/2016    PHOS 3.2 11/24/2016     Lab Results   Component Value Date    ALKPHOS 350 (H) 12/01/2016    BILITOT 1.3 (H) 12/01/2016    BILIDIR 1.20 (H) 11/24/2016    PROT 4.7 (L) 12/01/2016    ALBUMIN 2.5 (L) 12/01/2016    ALT 140 (H) 12/01/2016    AST 38 12/01/2016    GGT 261 (H) 11/24/2016       Imaging:  ERCP reviewed.

## 2016-12-01 NOTE — Unmapped (Signed)
Problem: Patient Care Overview  Goal: Plan of Care Review  Outcome: Progressing  Pt is A&O. ??Dysphagia 3 diet, refusing bolus tube feeds. Continent of bowel and bladder. ??No signs of skin breakdown. ??No c/o pain so far this shift. Skin and safety protocol maintained. ??Bed in lowest position, call bell within reach, wheels locked. ??Will continue to monitor.    12/01/16 0027   OTHER   Plan of Care Reviewed With patient   Plan of Care Review   Progress improving       Problem: Self-Care Deficit (Adult,Obstetrics,Pediatric)  Goal: Identify Related Risk Factors and Signs and Symptoms  Related risk factors and signs and symptoms are identified upon initiation of Human Response Clinical Practice Guideline (CPG).   Outcome: Progressing   11/25/16 1138   Self-Care Deficit (Adult,Obstetrics,Pediatric)   Related Risk Factors (Self-Care Deficit) activity intolerance;disease process;medication effects;surgery;pain/discomfort   Signs and Symptoms (Self-Care Deficit) decreased balance;gait unstable;weakness, paresis, paralysis     Goal: Improved Ability to Perform BADL and IADL  Patient will demonstrate the desired outcomes by discharge/transition of care.   Outcome: Progressing   12/01/16 0027   Self-Care Deficit (Adult,Obstetrics,Pediatric)   Improved Ability to Perform BADL and IADL making progress toward outcome       Problem: Fall Risk (Adult)  Goal: Identify Related Risk Factors and Signs and Symptoms  Related risk factors and signs and symptoms are identified upon initiation of Human Response Clinical Practice Guideline (CPG).   Outcome: Progressing   11/25/16 1138   Fall Risk (Adult)   Related Risk Factors (Fall Risk) fatigue/slow reaction;gait/mobility problems;polypharmacy;objects hard to reach   Signs and Symptoms (Fall Risk) presence of risk factors     Goal: Absence of Fall  Patient will demonstrate the desired outcomes by discharge/transition of care.   Outcome: Progressing   12/01/16 0027   Fall Risk (Adult) Absence of Fall making progress toward outcome       Problem: Activity Intolerance (Adult)  Goal: Identify Related Risk Factors and Signs and Symptoms  Related risk factors and signs and symptoms are identified upon initiation of Human Response Clinical Practice Guideline (CPG).   Outcome: Progressing   11/25/16 1138   Activity Intolerance (Adult)   Related Risk Factors (Activity Intolerance) generalized weakness;pain;medication side effects   Signs and Symptoms (Activity Intolerance) pain/discomfort;nausea     Goal: Activity Tolerance  Patient will demonstrate the desired outcomes by discharge/transition of care.   Outcome: Progressing   12/01/16 0027   Activity Intolerance (Adult)   Activity Tolerance making progress toward outcome     Goal: Effective Energy Conservation Techniques  Patient will demonstrate the desired outcomes by discharge/transition of care.   Outcome: Progressing   12/01/16 0027   Activity Intolerance (Adult)   Effective Energy Conservation Techniques making progress toward outcome       Problem: Nutrition, Imbalanced: Inadequate Oral Intake (Adult)  Goal: Identify Related Risk Factors and Signs and Symptoms  Related risk factors and signs and symptoms are identified upon initiation of Human Response Clinical Practice Guideline (CPG).   Outcome: Progressing   11/26/16 1715 11/30/16 1444   Nutrition, Imbalanced: Inadequate Oral Intake (Adult)   Related Risk Factors (Nutrition Imbalance, Inadequate Oral Intake) --  eating aversion   Signs and Symptoms (Nutrition Imbalance, Inadequate Oral Intake: Signs and Symptoms) weakness/lethargy --      Goal: Improved Oral Intake  Patient will demonstrate the desired outcomes by discharge/transition of care.   Outcome: Progressing   12/01/16 0027   Nutrition, Imbalanced: Inadequate  Oral Intake (Adult)   Improved Oral Intake making progress toward outcome     Goal: Prevent Further Weight Loss  Patient will demonstrate the desired outcomes by discharge/transition of care.   Outcome: Progressing   12/01/16 0027   Nutrition, Imbalanced: Inadequate Oral Intake (Adult)   Prevent Further Weight Loss making progress toward outcome       Problem: Infection, Risk/Actual (Adult)  Goal: Identify Related Risk Factors and Signs and Symptoms  Related risk factors and signs and symptoms are identified upon initiation of Human Response Clinical Practice Guideline (CPG).   Outcome: Progressing   11/25/16 1138   Infection, Risk/Actual (Adult)   Related Risk Factors (Infection, Risk/Actual) prolonged hospitalization;medication effects;surgery/procedure   Signs and Symptoms (Infection, Risk/Actual) feeding/eating intolerance;vomiting;weakness;pain     Goal: Infection Prevention/Resolution  Patient will demonstrate the desired outcomes by discharge/transition of care.   Outcome: Progressing   12/01/16 0027   Infection, Risk/Actual (Adult)   Infection Prevention/Resolution making progress toward outcome       Problem: Skin Injury Risk (Adult)  Goal: Identify Related Risk Factors and Signs and Symptoms  Related risk factors and signs and symptoms are identified upon initiation of Human Response Clinical Practice Guideline (CPG).   Outcome: Progressing   11/25/16 1138   Skin Injury Risk (Adult)   Related Risk Factors (Skin Injury Risk) cognitive impairment;hospitalization prolonged;mobility impaired     Goal: Skin Health and Integrity  Patient will demonstrate the desired outcomes by discharge/transition of care.   Outcome: Progressing   12/01/16 0027   Skin Injury Risk (Adult)   Skin Health and Integrity making progress toward outcome       Problem: VTE, DVT and PE (Adult)  Goal: Signs and Symptoms of Listed Potential Problems Will be Absent, Minimized or Managed (VTE, DVT and PE)  Signs and symptoms of listed potential problems will be absent, minimized or managed by discharge/transition of care (reference VTE, DVT and PE (Adult) CPG).   Outcome: Progressing   12/01/16 0027 VTE, DVT and PE (Adult)   Problems Assessed (VTE, DVT, PE) all   Problems Present (VTE, DVT, PE) none

## 2016-12-02 LAB — COMPREHENSIVE METABOLIC PANEL
ALBUMIN: 2.7 g/dL — ABNORMAL LOW (ref 3.5–5.0)
ALKALINE PHOSPHATASE: 367 U/L — ABNORMAL HIGH (ref 38–126)
ALT (SGPT): 110 U/L — ABNORMAL HIGH (ref 19–72)
ANION GAP: 6 mmol/L — ABNORMAL LOW (ref 9–15)
AST (SGOT): 22 U/L (ref 19–55)
BILIRUBIN TOTAL: 1.2 mg/dL (ref 0.0–1.2)
BLOOD UREA NITROGEN: 19 mg/dL (ref 7–21)
CALCIUM: 8.6 mg/dL (ref 8.5–10.2)
CHLORIDE: 99 mmol/L (ref 98–107)
CO2: 31 mmol/L — ABNORMAL HIGH (ref 22.0–30.0)
CREATININE: 1.1 mg/dL (ref 0.70–1.30)
EGFR MDRD AF AMER: >=60 mL/min/{1.73_m2} (ref >=60)
GLUCOSE RANDOM: 142 mg/dL (ref 65–179)
POTASSIUM: 4 mmol/L (ref 3.5–5.0)
PROTEIN TOTAL: 5 g/dL — ABNORMAL LOW (ref 6.5–8.3)
SODIUM: 136 mmol/L (ref 135–145)

## 2016-12-02 LAB — INR: Lab: 2.9

## 2016-12-02 LAB — CYCLOSPORINE (FPIA) BLOOD: Lab: 180

## 2016-12-02 LAB — GLUCOSE RANDOM: Glucose:MCnc:Pt:Ser/Plas:Qn:: 142

## 2016-12-02 NOTE — Unmapped (Signed)
MRI    GENERATOR INFORMATION  Manufacter & Model #  Medtronic [id number] Micra VR TCP-Leadless Pacemaker    LEAD INFORMATION  Abandoned/Epicardical Lead(s) ?? No         TESTING:       Sensing (mV):      RV PRE:  11.4 POST: 11.6    Capture Threshold (V@_ms ):    RV PRE: 0.38V@0 .  POST: 0.38v@0 .24            Impedance (between 200-1500ohms):     RV PRE: 520 POST: 540            MR CONDITIONAL STATUS AND MANAGEMENT    MR CONDITIONAL SYSTEM    Risks and Potential Complications discussed with patient and patient consents to procedure  System has been implanted more than 6 weeks? Yes   Pacing-Dependent:  No-SINUS @ 70    Programming Changes During MRI  Pacemaker Dependent:  NO  CIED  programmed to SURESCAN OVO      SETTINGS RESTORED TO PREVIOUS POST SCAN    Follow-up advised in 3-6 months after MRI

## 2016-12-02 NOTE — Unmapped (Signed)
Endocrinology Consult Note    Requesting Attending Physician :  Merrily Brittle, DO  Service Requesting Consult : Physical Medicine and Rehabilitation [facility])  Primary Care Provider: [name], MD  Outpatient Endocrinologist: N/A    Assessment/Recommendations:    66 yo male with h/o DM2 who s/p liver transplant. Endo consulted for management of DM in the setting of recent initiation of prednisone and tacrolimus and now on 14h tube feeds.     1. Type 2 diabetes, uncontrolled:     *No new recs:  -continue NPH 6u BID (do not hold if NPO). Previously hypoglycemic on 8 units when NPO.   -continue regular 4 units q6h to 4 units at 0600, 1200 and 1800 (HOLD IF NPO-duration intended to cover po intake and bolus feeds).  -continue the senstive regular scale q6h for now.     -Advise of any changes to steroid dose or to diet    - utilized the insulin subcutaneous management order set so administration instruction can help quide correct administration when NPO. Added in capital letters when to hold and not hold insulin.     2. Nutrition:  -on soft, honey thickened liquid diet and bolus feeds  EN regimen of Standard 1.5 w/ fiber (Jevity 1.5)   --- 1 can (237 mL) 5x daily (6a, 10a, 2p, 6p, 10p)  --- At goal, order will provide 1185 mL, 1777 kcal, 76 g PRO, 255 g CHO, 900 mL free water      3. Potential for amio induced thyroid dysfunction  Amio started 8/14  -Baseline TSH checked: 4.0  -Check TFT q6 months      Thank you for the consult. We will sign off as BG doing well. Please contact us for any additional or future concerns as well as changes in diet.  Please call/page [phone] with questions or concerns.         History of Present Illness: :    Reason for Consult:   DM2, exacerbated by steroids and calcineurin inhibitor    Jeffrey Ward is a 66 y.o. male w/ h/o DM2, Afib, pulmonary HTN, HCV cirrhosis admitted for <principal problem not specified> on 7/5. I have been asked to evaluate Jeffrey Ward for DM management. Post-op course was complicated by volume overload thought to be due to CHF exacerbation, respiratory failure now trached and on vent, and AKI requiring CRRT. A1c was 8.0 prior to surgery. He is on prednisone 2.5 mg qd since surgery, along with tacrolimus. He was transitioned from insulin gtt to sq POD1, while still NPO. NPH 15u BID achieved BG 130-200 at that time. However, once he started tube feeds, glucoses rose to 300s so he was switched back to insulin gtt on 7/18. He is currently on continuous tube feeds at goal rate, comprising 324 gm CHO per day.     Interval history: BG much improved.     All Medications:     Current Facility-Administered Medications   Medication Dose Route Frequency Provider Last Rate Last Dose   ??? albuterol 2.5 mg /3 mL (0.083 %) nebulizer solution 2.5 mg  2.5 mg Nebulization Q6H PRN Delane Ginger, DO   2.5 mg at 11/25/16 1116   ??? amiodarone (PACERONE) tablet 200 mg  200 mg PEG Tube Daily Daniel A Sainburg, DO   200 mg at 12/02/16 0846   ??? cycloSPORINE (NEORAL) microemulsion solution 225 mg  225 mg PEG Tube BID Particia Nearing, MD   225 mg at 12/02/16 0865   ???  dextrose 50 % in water (D50W) solution 12.5 g  12.5 g Intravenous Q10 Min PRN Murvin Donning, MD       ??? digoxin (LANOXIN) tablet 62.5 mcg  62.5 mcg PEG Tube Daily Clarisse Gouge Sainburg, DO   62.5 mcg at 12/02/16 1610   ??? docusate (COLACE) 50 mg/5 mL liquid 100 mg  100 mg G-tube BID Clarisse Gouge Sainburg, DO   100 mg at 12/02/16 0845   ??? insulin NPH (HumuLIN,NovoLIN) injection 6 Units  6 Units Subcutaneous BID Murvin Donning, MD   6 Units at 12/02/16 0901   ??? insulin regular (HumuLIN,NovoLIN) injection 0-12 Units  0-12 Units Subcutaneous [facility] Sarahanne Novakowski Lynne Warden [place], [place]   2 Units at 12/01/16 1722   ??? insulin regular (HumuLIN,NovoLIN) injection 4 Units  4 Units Subcutaneous 3xd Meals Julianah Marciel Mirian [place], [place]   4 Units at 12/02/16 0900   ??? lidocaine (LIDODERM) 5 % patch 1 patch  1 patch Transdermal Daily Delane Ginger, DO   1 patch at 11/29/16 0837   ??? melatonin tablet 6 mg  6 mg PEG Tube Nightly Delane Ginger, DO   6 mg at 12/01/16 2113   ??? mycophenolate (CELLCEPT) 200 mg/mL suspension 500 mg  500 mg G-tube BID Trellis Paganini [place], [place]   500 mg at 12/02/16 0844   ??? ocular lubricant (GENTEAL SEVERE) 0.3 % ophthalmic gel 1 drop  1 drop Both Eyes TID PRN Delane Ginger, DO       ??? ondansetron (ZOFRAN-ODT) disintegrating tablet 4 mg  4 mg Oral Q8H PRN Murvin Donning, MD   4 mg at 12/02/16 9604   ??? pantoprazole (PROTONIX) granules 40 mg  40 mg G-tube BID Clarisse Gouge Sainburg, DO   40 mg at 12/02/16 0846   ??? polyethylene glycol (MIRALAX) packet 17 g  17 g G-tube BID Clarisse Gouge Sainburg, DO   17 g at 11/26/16 0949   ??? saliva stimulant mucosal spray  1 spray Topical Q2H PRN Delane Ginger, DO       ??? sildenafil (antihypertensive) (REVATIO) 10 mg/mL oral suspension 30 mg  30 mg PEG Tube Q8H SCH Daniel A Sainburg, DO   30 mg at 12/02/16 0600   ??? sulfamethoxazole-trimethoprim (BACTRIM,SEPTRA) 200-40 mg/5 mL suspension 10 mL  10 mL PEG Tube Once per day on Mon Wed Fri Clarisse Gouge Sainburg, DO   10 mL at 12/02/16 0845   ??? thiamine (B-1) tablet 100 mg  100 mg PEG Tube Daily Daniel A Sainburg, DO   100 mg at 12/02/16 0846   ??? traMADol (ULTRAM) tablet 50 mg  50 mg PEG Tube Q4H PRN Delane Ginger, DO   50 mg at 12/02/16 0846   ??? valGANciclovir (VALCYTE) 50 mg/mL oral solution 450 mg  450 mg PEG Tube Daily Clarisse Gouge Sainburg, DO   450 mg at 12/02/16 0844   ??? warfarin (COUMADIN) tablet 3 mg  3 mg Oral Daily Daniel A Sainburg, DO   3 mg at 12/01/16 1718     Review of Systems:    Unable to complete due to patient factors.    Objective: :    Patient Vitals for the past 8 hrs:   BP Temp Temp src Pulse Resp SpO2   12/02/16 0500 154/71 36.4 ??C (97.5 ??F) Oral 69 18 98 %       No intake/output data recorded.    Physical Exam:  General appearance -lying in bed, NAD noted  PULM:  normal effort  SKIN: warm and dry  PSYCH: calm, cooperative    Test Results    Glucose POC all reviewed.        Data Review  Lab Results   Component Value Date    A1C 8.0 (H) 09/14/2016    A1C 7.8 (H) 09/05/2016    A1C 8.5 (H) 07/20/2016     Lab Results   Component Value Date    GFR >= 60 02/15/2012    CREATININE 1.10 12/02/2016

## 2016-12-02 NOTE — Unmapped (Signed)
Adult Nutrition Follow Up    Pt is a 66 yo male s/p liver transplant 7/5    ASSESSMENT  Events since Last RD Visit: Pt remains in [facility], working well with therapy. ENT consulted for dysphonia. S/p ERCP which showed biliary stricture x 2. Continues on Dysphagia 3 Diet w/ honey-thick liquids and bolus feeds of Jevity 1.5. Consistently refusing TFs 2/2 complaints of nausea. Calorie count ongoing although analysis deferred 2/2 only 2 meals recorded. Pt and wife out of room at time of visit.     Labs: Reviewed; glucose POC 128-257 x 24 hrs  Meds: Reviewed; insulin regimen, bowel regimen, thiamine  Abd/GI: last BM 9/21 x 1  Skin: see below  Patient Lines/Drains/Airways Status    Active Wounds     None                   Nutrition Orders            Start     Ordered    11/29/16 1701  Nutrition Therapy Soft (Dysphagia 3); Honey Thick Liquid  Effective now     Question Answer Comment   Nutrition Therapy (T): Soft (Dysphagia 3)    Dysphagia Liquids: Honey Thick Liquid        11/29/16 1700    11/24/16 2114  Adult Enteral Nutrition Jevity 1.5 (Standard 1.5 Cal w/ Fiber)  Effective now     Comments:  --- start with 120 mL, run over pump x 1 hr   --- increase bolus by 30 mL q4 hrs   --- final goal: 1 can (237 mL) 5x daily (6a, 10a, 2p, 6p, 10p)   Question Answer Comment   Feeding Route: G Tube    Enteral Nutrition Bolus (mL): 120    Enteral Nutrition Bolus frequency: 5x day (increase bolus by 30cc q4hr)    Enteral Nutrition Bolus Final Goal (mL): 237    Feeding Tube Flush (mL) 50 mL    Feeding Tube Flush Type: Water    Feeding Tube Flush Frequency: Before and after each bolus    Enteral Nutrition Formula: Jevity 1.5 (Standard 1.5 Cal w/ Fiber)        11/24/16 2113        Anthropometric Data:  -- Height:     Ht Readings from Last 1 Encounters:   11/20/16 157 cm (5' 1.81)   -- Last recorded weight: 79.7 kg (175 lb 11.2 oz)  -- Admission weight: 76.7 kg  -- UBW: 155 lbs (70.4 kg)  -- IBW:    -- Percent IBW: ~105%  -- AdjIBW: N/A  -- BMI: Body mass index is 32.33 kg/m??.   -- Weight changes this admission: +1.85L since admission to rehab  Last 5 Recorded Weights    11/24/16 2100 12/01/16 1700   Weight: 68.4 kg (150 lb 12.7 oz) 79.7 kg (175 lb 11.2 oz)      -- Weight history PTA: per EPIC wt hx, pt experienced ~8.4% wt loss x ~6 months. Wife reports loss some was intentional after changing dietary behaviors.  Wt Readings from Last 10 Encounters:   12/01/16 79.7 kg (175 lb 11.2 oz)   11/29/16 68 kg (150 lb)   11/22/16 67.8 kg (149 lb 7.6 oz)   10/08/16 73.5 kg (162 lb 0.6 oz)   09/06/16 75.2 kg (165 lb 12.6 oz)   08/02/16 71.1 kg (156 lb 12.8 oz)   07/27/16 70.2 kg (154 lb 11.2 oz)   07/05/16 71.7 kg (158 lb)  05/31/16 76 kg (167 lb 8 oz)   05/26/16 76.2 kg (167 lb 15.9 oz)     Nutrition Focused Physical Exam:   unable to complete NFPE 2/2 pt unavailable                        Estimated Nutrition Needs:   1610-9604 kcal/day (25-30 kcal/kg actual BW)  82-103 g protein/day (1.2-1.5 g/kg actual BW)  <45% total calories from CHO  Fluids per MD    Nutrition Assessment: pt is not meeting nutrient needs w/o supplemental bolus feeds. Would likely benefit from formula change to Osmolite 1.5, which is fiber-free and may be more easily tolerated than jevity. Request precise documentation of meal intake on calorie count to assess PO adequacy. Pt would likely benefit from oral nutrition supplements (Magic Cup) TID for added nutritional support.       Patient would benefit from nutrition education on food safety post-transplant.      ASPEN/AND Malnutrition Screening:  Malnutrition Assessment using AND/ASPEN Clinical Characteristics:   unable to determine (pending NFPE)                      Nutrition Diagnosis:   Inadequate oral intake related to peri-op period as evidenced by NPO - resolving, PO diet advanced to Dys 3  Food and nutrition-related knowledge deficit as related to new liver transplant as evidenced by need for nutrition education prior to discharge - ongoing    Nutrition Goals: diet advancement - met, basic understanding of nutrition education topic principle of food safety post liver transplant - not met, tube feeds at goal w/in 1 week of ICU stay - met, weight maintenance - ongoing    Nutrition Action/Recommendations/Interventions:   1. Continue Dysphagia 3 Diet w/ honey-thick liquids per SLP recommendation  2. Continue EN regimen of Standard 1.5 w/ fiber (Jevity 1.5)   --- 1 can (237 mL) 5x daily (6a, 10a, 2p, 6p, 10p)  --- At goal, order will provide 1185 mL, 1777 kcal, 76 g PRO,  255 g CHO, 900 mL free water    If pt continues to experience nausea with Jevity 1.5, suggest modifying formula to Osmolite 1.5 (fiber-free)   --- goal 5 cans will provide 1185 mL, 1777 kcal, 74 g PRO, 241 g CHO, and 900 mL free water   -- additional FWF per team  3. Will educate pt and caregiver on food safety prior to discharge home  4. Request twice weekly weights  5. Please add Phos onto daily labs  6. Ordered magic cups TID for added nutritional support    Follow-up: 1-2 times per week (and more frequent as indicated)     Jackqulyn Livings MPH, RD, LDN  Pager: [phone]

## 2016-12-02 NOTE — Unmapped (Signed)
Problem: Patient Care Overview  Goal: Plan of Care Review  Outcome: Progressing  Pt is alert and oriented. Decannulated 11/22/16 c current diet consisting of tube feeds 5x's a day and a honey thick mechanical soft PO diet. Pt refused 2200 tube feeds but states he is eating. Pt does not complain of pain. Will continue to monitor     12/01/16 0027   OTHER   Plan of Care Reviewed With patient   Plan of Care Review   Progress improving       Problem: Self-Care Deficit (Adult,Obstetrics,Pediatric)  Goal: Identify Related Risk Factors and Signs and Symptoms  Related risk factors and signs and symptoms are identified upon initiation of Human Response Clinical Practice Guideline (CPG).   Outcome: Progressing   11/25/16 1138   Self-Care Deficit (Adult,Obstetrics,Pediatric)   Related Risk Factors (Self-Care Deficit) activity intolerance;disease process;medication effects;surgery;pain/discomfort   Signs and Symptoms (Self-Care Deficit) decreased balance;gait unstable;weakness, paresis, paralysis     Goal: Improved Ability to Perform BADL and IADL  Patient will demonstrate the desired outcomes by discharge/transition of care.   Outcome: Progressing   12/01/16 0027   Self-Care Deficit (Adult,Obstetrics,Pediatric)   Improved Ability to Perform BADL and IADL making progress toward outcome       Problem: Fall Risk (Adult)  Goal: Absence of Fall  Patient will demonstrate the desired outcomes by discharge/transition of care.   Outcome: Progressing   12/01/16 0027   Fall Risk (Adult)   Absence of Fall making progress toward outcome       Problem: Activity Intolerance (Adult)  Goal: Identify Related Risk Factors and Signs and Symptoms  Related risk factors and signs and symptoms are identified upon initiation of Human Response Clinical Practice Guideline (CPG).   Outcome: Progressing   11/25/16 1138   Activity Intolerance (Adult)   Related Risk Factors (Activity Intolerance) generalized weakness;pain;medication side effects   Signs and Symptoms (Activity Intolerance) pain/discomfort;nausea     Goal: Activity Tolerance  Patient will demonstrate the desired outcomes by discharge/transition of care.   Outcome: Progressing   12/01/16 0027   Activity Intolerance (Adult)   Activity Tolerance making progress toward outcome     Goal: Effective Energy Conservation Techniques  Patient will demonstrate the desired outcomes by discharge/transition of care.   Outcome: Progressing   12/01/16 0027   Activity Intolerance (Adult)   Effective Energy Conservation Techniques making progress toward outcome       Problem: Nutrition, Imbalanced: Inadequate Oral Intake (Adult)  Goal: Identify Related Risk Factors and Signs and Symptoms  Related risk factors and signs and symptoms are identified upon initiation of Human Response Clinical Practice Guideline (CPG).   Outcome: Progressing   11/26/16 1715 11/30/16 1444   Nutrition, Imbalanced: Inadequate Oral Intake (Adult)   Related Risk Factors (Nutrition Imbalance, Inadequate Oral Intake) --  eating aversion   Signs and Symptoms (Nutrition Imbalance, Inadequate Oral Intake: Signs and Symptoms) weakness/lethargy --      Goal: Improved Oral Intake  Patient will demonstrate the desired outcomes by discharge/transition of care.   Outcome: Progressing   12/01/16 0027   Nutrition, Imbalanced: Inadequate Oral Intake (Adult)   Improved Oral Intake making progress toward outcome     Goal: Prevent Further Weight Loss  Patient will demonstrate the desired outcomes by discharge/transition of care.   Outcome: Progressing   12/01/16 0027   Nutrition, Imbalanced: Inadequate Oral Intake (Adult)   Prevent Further Weight Loss making progress toward outcome       Problem: Infection, Risk/Actual (  Adult)  Goal: Identify Related Risk Factors and Signs and Symptoms  Related risk factors and signs and symptoms are identified upon initiation of Human Response Clinical Practice Guideline (CPG).   Outcome: Progressing   11/25/16 1138   Infection, Risk/Actual (Adult)   Related Risk Factors (Infection, Risk/Actual) prolonged hospitalization;medication effects;surgery/procedure   Signs and Symptoms (Infection, Risk/Actual) feeding/eating intolerance;vomiting;weakness;pain     Goal: Infection Prevention/Resolution  Patient will demonstrate the desired outcomes by discharge/transition of care.   Outcome: Progressing   12/01/16 0027   Infection, Risk/Actual (Adult)   Infection Prevention/Resolution making progress toward outcome       Problem: Skin Injury Risk (Adult)  Goal: Identify Related Risk Factors and Signs and Symptoms  Related risk factors and signs and symptoms are identified upon initiation of Human Response Clinical Practice Guideline (CPG).   Outcome: Progressing   11/25/16 1138   Skin Injury Risk (Adult)   Related Risk Factors (Skin Injury Risk) cognitive impairment;hospitalization prolonged;mobility impaired     Goal: Skin Health and Integrity  Patient will demonstrate the desired outcomes by discharge/transition of care.   Outcome: Progressing   12/01/16 0027   Skin Injury Risk (Adult)   Skin Health and Integrity making progress toward outcome       Problem: VTE, DVT and PE (Adult)  Goal: Signs and Symptoms of Listed Potential Problems Will be Absent, Minimized or Managed (VTE, DVT and PE)  Signs and symptoms of listed potential problems will be absent, minimized or managed by discharge/transition of care (reference VTE, DVT and PE (Adult) CPG).   Outcome: Progressing   12/01/16 0027   VTE, DVT and PE (Adult)   Problems Assessed (VTE, DVT, PE) all   Problems Present (VTE, DVT, PE) none

## 2016-12-02 NOTE — Unmapped (Signed)
Problem: Patient Care Overview  Goal: Plan of Care Review  Outcome: Progressing  Pt is alert and oriented x3. Pt took all medications per order without complication via peg tube. Pt remains on anticoagulation therapy for VTE, DVT and PE prevention. Pts surgical incisions treated per order, no complications noted. Pt monitored for infection during shift, Pt remains on protective precautions related to prior transplant. Pt remains on tube feedings five times a day, including ordering meals. Pt remains a minimal assist for transfers and a moderate assist for ADLs. Safety measures in place for fall risk reduction. Nursing will continue to monitor and assess.  Goal: Individualization and Mutuality  Outcome: Progressing    Goal: Discharge Needs Assessment  Outcome: Progressing    Goal: Interprofessional Rounds/Family Conf  Outcome: Progressing      Problem: Self-Care Deficit (Adult,Obstetrics,Pediatric)  Goal: Identify Related Risk Factors and Signs and Symptoms  Related risk factors and signs and symptoms are identified upon initiation of Human Response Clinical Practice Guideline (CPG).   Outcome: Progressing    Goal: Improved Ability to Perform BADL and IADL  Patient will demonstrate the desired outcomes by discharge/transition of care.   Outcome: Progressing      Problem: Fall Risk (Adult)  Goal: Identify Related Risk Factors and Signs and Symptoms  Related risk factors and signs and symptoms are identified upon initiation of Human Response Clinical Practice Guideline (CPG).   Outcome: Progressing    Goal: Absence of Fall  Patient will demonstrate the desired outcomes by discharge/transition of care.   Outcome: Progressing      Problem: Activity Intolerance (Adult)  Goal: Identify Related Risk Factors and Signs and Symptoms  Related risk factors and signs and symptoms are identified upon initiation of Human Response Clinical Practice Guideline (CPG).   Outcome: Progressing    Goal: Activity Tolerance  Patient will demonstrate the desired outcomes by discharge/transition of care.   Outcome: Progressing    Goal: Effective Energy Conservation Techniques  Patient will demonstrate the desired outcomes by discharge/transition of care.   Outcome: Progressing      Problem: Nutrition, Imbalanced: Inadequate Oral Intake (Adult)  Goal: Identify Related Risk Factors and Signs and Symptoms  Related risk factors and signs and symptoms are identified upon initiation of Human Response Clinical Practice Guideline (CPG).   Outcome: Progressing    Goal: Prevent Further Weight Loss  Patient will demonstrate the desired outcomes by discharge/transition of care.   Outcome: Progressing      Problem: Infection, Risk/Actual (Adult)  Goal: Identify Related Risk Factors and Signs and Symptoms  Related risk factors and signs and symptoms are identified upon initiation of Human Response Clinical Practice Guideline (CPG).   Outcome: Progressing    Goal: Infection Prevention/Resolution  Patient will demonstrate the desired outcomes by discharge/transition of care.   Outcome: Progressing      Problem: Skin Injury Risk (Adult)  Goal: Identify Related Risk Factors and Signs and Symptoms  Related risk factors and signs and symptoms are identified upon initiation of Human Response Clinical Practice Guideline (CPG).   Outcome: Progressing    Goal: Skin Health and Integrity  Patient will demonstrate the desired outcomes by discharge/transition of care.   Outcome: Progressing      Problem: VTE, DVT and PE (Adult)  Goal: Signs and Symptoms of Listed Potential Problems Will be Absent, Minimized or Managed (VTE, DVT and PE)  Signs and symptoms of listed potential problems will be absent, minimized or managed by discharge/transition of care (reference VTE, DVT  and PE (Adult) CPG).   Outcome: Progressing

## 2016-12-02 NOTE — Unmapped (Signed)
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 Post-op course complicated by decompensation secondary to acute right heart failure, AKI requiring CRRT (resolved), severe pulmonary hypertension responsive to sildenafil, bradycardia/ asystole requiring pacemaker placement, atrial fibrilliation/ narrow complex tachycardia requiring anti-coagulation with warfarin and amiodarone/ digoxin, malnutrition/ debility, and altered mental status/ delirium improved with transition to cyclosporine and psychiatric medications. On 11/03/16 he had a significant spike in his LFTs s/p biopsy 8/24 negative for rejection. He continued to recover and was transferred to floor status 8/28. The patient was transferred back to the SICU 9/9 2/2 GI bleed following PEG placement. Once resuscitated and stabilized, he was transferred back to the floor 9/10. He was evaluated by [facility] 9/13 and accepted for admission.     LFTs were elevated on 9/14 and downtrending today. Liver transplant U/S performed 9/14- RIs stable, vessels patent, mild increased dilation of CBD. 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       Plan:       -- Continue daily CMP, CBC, coags  -- Continue to monitor Cyclosporine and adjust according to trough  -- Would hold off on MRI at this time  -- MRI abdomen for [facility] recurrence screening will be performed at 6 months     -- Transplant surgery will continue to follow     Subjective/Interval Events:   No acute events overnight. Tolerating a dysphagia 3 diet. Asking about MRI.     Objective:        Vital Signs:  BP 154/71  - Pulse 69  - Temp 36.4 ??C (Oral)  - Resp 18  - Wt 79.7 kg (175 lb 11.2 oz)  - SpO2 98%  - BMI 32.33 kg/m??     Input/Output:  I/O last 3 completed shifts:  In: 900 [NG/GT:900]  Out: 51 [Urine:50; Stool:1]    Physical Exam:  General: No acute distress, lying in bed  Neuro: Awake and interactive  Lungs: Normal work of breathing on room air, decannulation site healing well, voice stronger    Abdomen: Soft, non-tender, non-distended, incision well healed    Lab Results   Component Value Date    WBC 1.2 (L) 11/28/2016    HGB 9.0 (L) 11/28/2016    HCT 26.1 (L) 11/28/2016    PLT 117 (L) 11/28/2016       Lab Results   Component Value Date    NA 136 12/02/2016    K 4.0 12/02/2016    CL 99 12/02/2016    CO2 31.0 (H) 12/02/2016    BUN 19 12/02/2016    CREATININE 1.10 12/02/2016    CALCIUM 8.6 12/02/2016    MG 2.0 11/24/2016    PHOS 3.2 11/24/2016     Lab Results   Component Value Date    ALKPHOS 367 (H) 12/02/2016    BILITOT 1.2 12/02/2016    BILIDIR 1.20 (H) 11/24/2016    PROT 5.0 (L) 12/02/2016    ALBUMIN 2.7 (L) 12/02/2016    ALT 110 (H) 12/02/2016    AST 22 12/02/2016    GGT 261 (H) 11/24/2016       Imaging:  No new imaging

## 2016-12-03 LAB — COMPREHENSIVE METABOLIC PANEL
ALBUMIN: 2.7 g/dL — ABNORMAL LOW (ref 3.5–5.0)
ALKALINE PHOSPHATASE: 313 U/L — ABNORMAL HIGH (ref 38–126)
ALT (SGPT): 90 U/L — ABNORMAL HIGH (ref 19–72)
ANION GAP: 6 mmol/L — ABNORMAL LOW (ref 9–15)
AST (SGOT): 23 U/L (ref 19–55)
BILIRUBIN TOTAL: 1.2 mg/dL (ref 0.0–1.2)
BLOOD UREA NITROGEN: 16 mg/dL (ref 7–21)
BUN / CREAT RATIO: 16
CALCIUM: 8.6 mg/dL (ref 8.5–10.2)
CHLORIDE: 98 mmol/L (ref 98–107)
CO2: 32 mmol/L — ABNORMAL HIGH (ref 22.0–30.0)
EGFR MDRD AF AMER: >=60 mL/min/{1.73_m2} (ref >=60)
EGFR MDRD NON AF AMER: >=60 mL/min/{1.73_m2} (ref >=60)
GLUCOSE RANDOM: 178 mg/dL (ref 65–179)
POTASSIUM: 4.2 mmol/L (ref 3.5–5.0)
PROTEIN TOTAL: 4.9 g/dL — ABNORMAL LOW (ref 6.5–8.3)

## 2016-12-03 LAB — PROTIME-INR: INR: 2.59

## 2016-12-03 LAB — INR: Lab: 2.59

## 2016-12-03 LAB — CO2: Carbon dioxide:SCnc:Pt:Ser/Plas:Qn:: 32 — ABNORMAL HIGH

## 2016-12-03 NOTE — Unmapped (Signed)
Problem: Patient Care Overview  Goal: Plan of Care Review  Outcome: Progressing  Pt. Alert and Oriented x4. Pt on a mechanical soft, honey thick diet and calorie count as well as Jevity 1.5 5x's a day. Pt refused 2200 tube feeds due to what he describes as fullness which impedes on his ability to eat solid food. Education given. Fall, Safety, and Pressure Ulcer Prevention Protocol in place. Will continue to monitor.    12/02/16 1555   OTHER   Plan of Care Reviewed With patient   Plan of Care Review   Progress improving     Goal: Discharge Needs Assessment  Outcome: Progressing   11/25/16 0950 11/25/16 1209   OTHER   Equipment Currently Used at Home --  cane, straight;glucometer  (Used a cane occasionally. )   Patient and/or family were provided with choice of facilities / services that are available and appropriate to meet post hospital care needs? --  N/A   Discharge Needs Assessment   Concerns to be Addressed --  adjustment to diagnosis/illness;care coordination/care conferences;discharge planning   Readmission Within the Last 30 Days --  planned readmission  (Acute to [facility] admission)   Transportation Anticipated family or friend will provide --    Anticipated Changes Related to Illness --  inability to care for self   Equipment Needed After Discharge --  other (see comments)  (Possibly oxygen, will try to wean patient. TBD regarding other HME)   Discharge Facility/Level of Care Needs --  other (see comments)  (TBD)     Goal: Interprofessional Rounds/Family Conf  Outcome: Progressing   11/25/16 1209   OTHER   Clinical EDD (Estimated Discharge Date)  (Will team on Tuesday)       Problem: Self-Care Deficit (Adult,Obstetrics,Pediatric)  Goal: Identify Related Risk Factors and Signs and Symptoms  Related risk factors and signs and symptoms are identified upon initiation of Human Response Clinical Practice Guideline (CPG).   Outcome: Progressing   12/02/16 1555   Self-Care Deficit (Adult,Obstetrics,Pediatric) Related Risk Factors (Self-Care Deficit) activity intolerance;disease process;medication effects;pain/discomfort;surgery   Signs and Symptoms (Self-Care Deficit) decreased balance;gait unstable;weakness, paresis, paralysis     Goal: Improved Ability to Perform BADL and IADL  Patient will demonstrate the desired outcomes by discharge/transition of care.   Outcome: Progressing   12/02/16 1555   Self-Care Deficit (Adult,Obstetrics,Pediatric)   Improved Ability to Perform BADL and IADL making progress toward outcome       Problem: Fall Risk (Adult)  Goal: Identify Related Risk Factors and Signs and Symptoms  Related risk factors and signs and symptoms are identified upon initiation of Human Response Clinical Practice Guideline (CPG).   Outcome: Progressing   12/02/16 1555   Fall Risk (Adult)   Related Risk Factors (Fall Risk) fatigue/slow reaction   Signs and Symptoms (Fall Risk) presence of risk factors     Goal: Absence of Fall  Patient will demonstrate the desired outcomes by discharge/transition of care.   Outcome: Progressing   12/02/16 1555   Fall Risk (Adult)   Absence of Fall making progress toward outcome       Problem: Activity Intolerance (Adult)  Goal: Identify Related Risk Factors and Signs and Symptoms  Related risk factors and signs and symptoms are identified upon initiation of Human Response Clinical Practice Guideline (CPG).   Outcome: Progressing   12/02/16 1555   Activity Intolerance (Adult)   Related Risk Factors (Activity Intolerance) generalized weakness   Signs and Symptoms (Activity Intolerance) nausea;pain/discomfort  Goal: Activity Tolerance  Patient will demonstrate the desired outcomes by discharge/transition of care.   Outcome: Progressing   12/02/16 1555   Activity Intolerance (Adult)   Activity Tolerance making progress toward outcome     Goal: Effective Energy Conservation Techniques  Patient will demonstrate the desired outcomes by discharge/transition of care.   Outcome: Progressing 12/02/16 1555   Activity Intolerance (Adult)   Effective Energy Conservation Techniques making progress toward outcome       Problem: Nutrition, Imbalanced: Inadequate Oral Intake (Adult)  Goal: Identify Related Risk Factors and Signs and Symptoms  Related risk factors and signs and symptoms are identified upon initiation of Human Response Clinical Practice Guideline (CPG).   Outcome: Progressing   12/02/16 1555   Nutrition, Imbalanced: Inadequate Oral Intake (Adult)   Related Risk Factors (Nutrition Imbalance, Inadequate Oral Intake) eating aversion   Signs and Symptoms (Nutrition Imbalance, Inadequate Oral Intake: Signs and Symptoms) nausea and vomiting     Goal: Improved Oral Intake  Patient will demonstrate the desired outcomes by discharge/transition of care.   Outcome: Progressing   12/02/16 1555   Nutrition, Imbalanced: Inadequate Oral Intake (Adult)   Improved Oral Intake making progress toward outcome     Goal: Prevent Further Weight Loss  Patient will demonstrate the desired outcomes by discharge/transition of care.   Outcome: Progressing      Problem: Infection, Risk/Actual (Adult)  Goal: Identify Related Risk Factors and Signs and Symptoms  Related risk factors and signs and symptoms are identified upon initiation of Human Response Clinical Practice Guideline (CPG).   Outcome: Progressing   12/02/16 1555   Infection, Risk/Actual (Adult)   Related Risk Factors (Infection, Risk/Actual) prolonged hospitalization;medication effects;surgery/procedure   Signs and Symptoms (Infection, Risk/Actual) feeding/eating intolerance;vomiting;weakness     Goal: Infection Prevention/Resolution  Patient will demonstrate the desired outcomes by discharge/transition of care.   Outcome: Progressing   12/02/16 1555   Infection, Risk/Actual (Adult)   Infection Prevention/Resolution making progress toward outcome       Problem: Skin Injury Risk (Adult)  Goal: Identify Related Risk Factors and Signs and Symptoms  Related risk factors and signs and symptoms are identified upon initiation of Human Response Clinical Practice Guideline (CPG).   Outcome: Progressing   12/02/16 1555   Skin Injury Risk (Adult)   Related Risk Factors (Skin Injury Risk) cognitive impairment;hospitalization prolonged;mobility impaired     Goal: Skin Health and Integrity  Patient will demonstrate the desired outcomes by discharge/transition of care.   Outcome: Progressing   12/02/16 1555   Skin Injury Risk (Adult)   Skin Health and Integrity making progress toward outcome       Problem: VTE, DVT and PE (Adult)  Goal: Signs and Symptoms of Listed Potential Problems Will be Absent, Minimized or Managed (VTE, DVT and PE)  Signs and symptoms of listed potential problems will be absent, minimized or managed by discharge/transition of care (reference VTE, DVT and PE (Adult) CPG).   Outcome: Progressing   12/02/16 1555   VTE, DVT and PE (Adult)   Problems Assessed (VTE, DVT, PE) all   Problems Present (VTE, DVT, PE) none

## 2016-12-03 NOTE — Unmapped (Signed)
Otolaryngology Consult Note      Requesting Attending Physician:  Merrily Brittle, DO  Service Requesting Consult:  Physical Medicine and Rehabilitation [facility])      Assessment/Recommendations:  This is a 66 y.o. 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 He has been admitted multiple times during his hospital stay causing breathy almost hoarse voice and coughing with swallowing. 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 Continue work with SLP to find safe diet in setting of aspiration risk  2. We can arrange for follow up in 3-4 months to evaluate recovery of nerve function and discuss possible injection laryngoplasty.     The patient will be staffed with Genia Harold, MD..  Thank you for inviting [place] to assist in the care of your patient.  Please page the Otolaryngology consult pager at [phone] with questions/concerns.        History of Present Illness:     Problem List    Active Problems:    Mixed level of activity delirium due to multiple etiologies, resolved    Insomnia        Jeffrey Ward is seen in consultation at the request of Merrily Brittle, DO for hoarseness. He has had a complicated hospital course following liver transplant requiring multiple procedures and intubations as well as tracheostomy. He is since decannulated but has noticed his voice being hoarse since he has been here. Additionally, he has been having some coughing with liquids. Currently on a dysphagia  Diet and being followed by SLP.       Past Medical History    Past Medical History:   Diagnosis Date   ??? Cancer (CMS-HCC)    ??? Cirrhosis (CMS-HCC)    ??? Diabetes (CMS-HCC)    ??? Hepatitis C 07/17/2012   ??? Liver disease    ??? Low back pain 07/17/2012   ??? Varices, esophageal (CMS-HCC)        Allergies    Patient has no known allergies.    Medications      Current Facility-Administered Medications   Medication Dose Route Frequency Provider Last Rate Last Dose   ??? albuterol 2.5 mg /3 mL (0.083 %) nebulizer solution 2.5 mg  2.5 mg Nebulization Q6H PRN Delane Ginger, DO   2.5 mg at 11/25/16 1116   ??? amiodarone (PACERONE) tablet 200 mg  200 mg PEG Tube Daily Daniel A Sainburg, DO   200 mg at 12/02/16 0846   ??? cycloSPORINE (NEORAL) microemulsion solution 250 mg  250 mg PEG Tube BID Bufford Lope, MD   250 mg at 12/02/16 2006   ??? dextrose 50 % in water (D50W) solution 12.5 g  12.5 g Intravenous Q10 Min PRN Murvin Donning, MD       ??? digoxin (LANOXIN) tablet 62.5 mcg  62.5 mcg PEG Tube Daily Clarisse Gouge Sainburg, DO   62.5 mcg at 12/02/16 8469   ??? docusate (COLACE) 50 mg/5 mL liquid 100 mg  100 mg G-tube BID Delane Ginger, DO   100 mg at 12/02/16 2006   ??? insulin NPH (HumuLIN,NovoLIN) injection 6 Units  6 Units Subcutaneous BID Murvin Donning, MD   6 Units at 12/02/16 1747   ??? insulin regular (HumuLIN,NovoLIN) injection 0-12 Units  0-12 Units Subcutaneous [facility] April  Mirian Capuchin, FNP   1 Units at 12/02/16 1746   ??? insulin regular (HumuLIN,NovoLIN) injection 4 Units  4 Units Subcutaneous 3xd Meals April Lynne Goley, [place]   4 Units at 12/02/16 1746   ??? lidocaine (LIDODERM) 5 % patch 1 patch  1 patch Transdermal Daily Delane Ginger, DO   1 patch at 11/29/16 0837   ??? melatonin tablet 6 mg  6 mg PEG Tube Nightly Delane Ginger, DO   6 mg at 12/02/16 2006   ??? mycophenolate (CELLCEPT) 200 mg/mL suspension 500 mg  500 mg G-tube BID Trellis Paganini [place], [place]   500 mg at 12/02/16 2005   ??? ocular lubricant (GENTEAL SEVERE) 0.3 % ophthalmic gel 1 drop  1 drop Both Eyes TID PRN Delane Ginger, DO       ??? ondansetron (ZOFRAN-ODT) disintegrating tablet 4 mg  4 mg Oral Q8H PRN Murvin Donning, MD   4 mg at 12/02/16 1610   ??? pantoprazole (PROTONIX) granules 40 mg  40 mg G-tube BID Delane Ginger, DO   40 mg at 12/02/16 2006   ??? polyethylene glycol (MIRALAX) packet 17 g  17 g G-tube BID Clarisse Gouge Sainburg, DO   17 g at 11/26/16 0949   ??? saliva stimulant mucosal spray  1 spray Topical Q2H PRN Delane Ginger, DO       ??? sildenafil (antihypertensive) (REVATIO) 10 mg/mL oral suspension 30 mg  30 mg PEG Tube [facility] Daniel A Sainburg, DO   30 mg at 12/02/16 2005   ??? sulfamethoxazole-trimethoprim (BACTRIM,SEPTRA) 200-40 mg/5 mL suspension 10 mL  10 mL PEG Tube Once per day on Mon Wed Fri Clarisse Gouge [place]ain[place], DO   10 mL at 12/02/16 0845   ??? thiamine (B-1) tablet 100 mg  100 mg PEG Tube Daily Daniel A Sainburg, DO   100 mg at 12/02/16 0846   ??? traMADol (ULTRAM) tablet 50 mg  50 mg PEG Tube Q4H PRN Delane Ginger, DO   50 mg at 12/02/16 2015   ??? valGANciclovir (VALCYTE) 50 mg/mL oral solution 450 mg  450 mg PEG Tube Daily Clarisse Gouge Sainburg, DO   450 mg at 12/02/16 0844   ??? warfarin (COUMADIN) tablet 3 mg  3 mg Oral Daily Daniel A Sainburg, DO   3 mg at 12/02/16 1749       Past Surgical History    Past Surgical History:   Procedure Laterality Date   ??? ANKLE SURGERY     ??? BACK SURGERY     ??? BACK SURGERY     ??? CHG US GUIDE, TISSUE ABLATION N/A 01/22/2016    Procedure: ULTRASOUND GUIDANCE FOR, AND MONITORING OF, PARENCHYMAL TISSUE ABLATION;  Surgeon: Particia Nearing, MD;  Location: MAIN OR [facility];  Service: Transplant   ??? IR EMBOLIZATION ORGAN ISCHEMIA, TUMORS, INFAR  06/16/2016    IR EMBOLIZATION ORGAN ISCHEMIA, TUMORS, INFAR 06/16/2016 Ammie Dalton, MD IMG VIR H[facility]   ??? PR COLON CA SCRN NOT HI RSK IND N/A 02/27/2015    Procedure: COLOREC CNCR SCR;COLNSCPY NO;  Surgeon: Vonda Antigua, MD;  Location: GI PROCEDURES MEMORIAL [facility];  Service: Gastroenterology   ??? PR ENDOSCOPIC ULTRASOUND EXAM N/A 02/27/2015    Procedure: UGI ENDO; W/ENDO ULTRASOUND EXAM INCLUDES ESOPHAGUS, STOMACH, &/OR DUODENUM/JEJUNUM;  Surgeon: Vonda Antigua, MD;  Location: GI PROCEDURES MEMORIAL [facility];  Service: Gastroenterology   ??? PR ERCP STENT PLACEMENT BILIARY/PANCREATIC DUCT N/A 11/29/2016    Procedure: ENDOSCOPIC RETROGRADE  CHOLANGIOPANCREATOGRAPHY (ERCP); WITH PLACEMENT OF ENDOSCOPIC STENT INTO BILIARY OR PANCREATIC DUCT;  Surgeon: Chriss Driver, MD;  Location: GI PROCEDURES MEMORIAL [facility];  Service: Gastroenterology   ??? PR ERCP,W/REMOVAL STONE,BIL/PANCR DUCTS N/A 11/29/2016    Procedure: ERCP; W/ENDOSCOPIC RETROGRADE REMOVAL OF CALCULUS/CALCULI FROM BILIARY &/OR PANCREATIC DUCTS;  Surgeon: Chriss Driver, MD;  Location: GI PROCEDURES MEMORIAL [facility];  Service: Gastroenterology   ??? PR INSER HEART TEMP PACER ONE CHMBR N/A 10/02/2016    Procedure: Tempoarary Pacemaker Insertion;  Surgeon: Meredith Leeds, MD;  Location: [facility];  Service: Cardiology   ??? PR LAP,DIAGNOSTIC ABDOMEN N/A 01/22/2016    Procedure: Laparoscopy, Abdomen, Peritoneum, & Omentum, Diagnostic, W/Wo Collection Specimen(S) By Brushing Or Washing;  Surgeon: Particia Nearing, MD;  Location: MAIN OR [facility];  Service: Transplant   ??? PR PLACE PERCUT GASTROSTOMY TUBE N/A 11/17/2016    Procedure: UGI ENDO; W/DIRECTED PLCMT PERQ GASTROSTOMY TUBE;  Surgeon: Cletis Athens, MD;  Location: GI PROCEDURES MEMORIAL [facility];  Service: Gastroenterology   ??? PR TRACHEOSTOMY, PLANNED N/A 09/29/2016    Procedure: TRACHEOSTOMY PLANNED (SEPART PROC);  Surgeon: Katherina Mires, MD;  Location: MAIN OR [facility];  Service: Trauma   ??? PR TRANSCATH INSERT OR REPLACE LEADLESS PM VENTR N/A 10/11/2016    Procedure: Pacemaker Implant/Replace Leadless;  Surgeon: Meredith Leeds, MD;  Location: [facility] EP;  Service: Cardiology   ??? PR TRANSPLANT LIVER,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    Procedure: Liver Allotransplantation; Orthotopic, Partial Or Whole, From Cadaver Or Living Donor, Any Age;  Surgeon: Doyce Loose, MD;  Location: MAIN OR [facility];  Service: Transplant   ??? PR TRANSPLANT,PREP DONOR LIVER, WHOLE N/A 09/15/2016    Procedure: Rogelia Boga Std Prep Cad Donor Whole Liver Gft Prior Tnsplnt,Inc Chole,Diss/Rem Surr Tissu Wo Triseg/Lobe Splt;  Surgeon: Doyce Loose, MD;  Location: MAIN OR [facility];  Service: Transplant   ??? PR UPPER GI ENDOSCOPY,BIOPSY N/A 07/17/2012    Procedure: UGI ENDOSCOPY; WITH BIOPSY, SINGLE OR MULTIPLE;  Surgeon: Alba Destine, MD;  Location: GI PROCEDURES MEMORIAL [facility];  Service: Gastroenterology   ??? PR UPPER GI ENDOSCOPY,DIAGNOSIS N/A 02/04/2014    Procedure: UGI ENDO, INCLUDE ESOPHAGUS, STOMACH, & DUODENUM &/OR JEJUNUM; DX W/WO COLLECTION SPECIMN, BY BRUSH OR WASH;  Surgeon: Wilburt Finlay, MD;  Location: GI PROCEDURES MEMORIAL [facility];  Service: Gastroenterology   ??? PR UPPER GI ENDOSCOPY,LIGAT VARIX N/A 11/05/2013    Procedure: UGI ENDO; W/BAND LIG ESOPH &/OR GASTRIC VARICES;  Surgeon: Wilburt Finlay, MD;  Location: GI PROCEDURES MEMORIAL [facility];  Service: Gastroenterology       Family History    Family History   Problem Relation Age of Onset   ??? Hypertension Mother    ??? Cirrhosis Neg Hx    ??? Liver cancer Neg Hx        Social History:    Tobacco use: reports that he has never smoked. He has never used smokeless tobacco.  Alcohol use:  reports that he does not drink alcohol.  Drug use: reports that he does not use drugs.  Social Narrative: With Wife    Review of Systems  Review of Systems:  As above and, otherwise the balance of 11 systems was negative.    Objective:     Vital Signs  Temp:  [36.4 ??C] 36.4 ??C  Heart Rate:  [69-75] 71  Resp:  [16-18] 16  BP: (125-154)/(61-74) 139/63  SpO2:  [91 %-98 %] 94 %  BMI (Calculated):  [23.5] 23.5  I/O this shift:  In: -   Out: 100 [Urine:100]      Physical Exam  BP 139/63  - Pulse 71  - Temp 36.4 ??C (Oral)  - Resp 16  - Ht 172.7 cm (5' 8)  - Wt 69 kg (152 lb 1.9 oz)  - SpO2 94%  - BMI 23.13 kg/m??     General: chronically ill, stated age, no distress  Very breathy, occasional phonation but generally aphonic  Head - atraumatic, normocephalic   Face - no surface abnormalities, no tenderness over sinuses Cranial Nerves - AD 1/6, AS 1/6  Eyes - no conjunctivitis, no scleritis/iritis, EOM full   Ears - External ear- normal, no lesions, no malformations   Otoscopy - Bilateral EACs and TMs normal  Nose - external nose without deformity, anterior rhinoscopy - turbinates, mucosa normal   Oral Cavity - lips without lesions, alveolus normal, hard palate normal, mucosa floor of mouth, buccal mucosa, retromolar trigone mucosa all normal   Oropharynx - no mucosal lesions, tonsil, soft palate, posterior pharynx   Salivary Glands - no mass or assymmetry, no tenderness   Neck - no lymphadenopathy, no thyromegaly   Lymphatic - no neck lymphadenopathy   Cardiovascular - heart regular rate and rhythym, no murmurs appreciated  Pulmonary - lungs clear to auscultation bilaterally  Neurologic - no focal abnormalities     FLEXIBLE FIBEROPTIC NASOPHARYNGOLARYNGOSCOPY (CPT 31575): To better evaluate the patient???s symptoms or tumor site, fiberoptic laryngoscopy is indicated.  After discussion of risks and benefits, and topical decongestion and anesthesia, the endoscopy was performed without complications. A time out identifying the patient, the procedure, the location of the procedure and any concerns was performed prior to beginning the procedure.    Findings:  Nasopharynx clear of masses. Bilateral patent Eustachian tube orifices. Healthy, symmetric nasopharyngeal and pharyngeal mucosa with no masses, lesions, or friable mucosa. Symmetric base of tongue with clear vallecula bilaterally. The piriform sinuses are clear bilaterally. Inspection of the larynx reveals no evidence of supraglottic or glottic masses. 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     Test Results  Lab Results   Component Value Date    WBC 1.2 (L) 11/28/2016    HGB 9.0 (L) 11/28/2016    HCT 26.1 (L) 11/28/2016    PLT 117 (L) 11/28/2016       Lab Results   Component Value Date    NA 136 12/02/2016    K 4.0 12/02/2016    CL 99 12/02/2016    CO2 31.0 (H) 12/02/2016    BUN 19 12/02/2016    CREATININE 1.10 12/02/2016    GLU 142 12/02/2016    CALCIUM 8.6 12/02/2016    MG 2.0 11/24/2016    PHOS 3.2 11/24/2016       Lab Results   Component Value Date    BILITOT 1.2 12/02/2016    BILIDIR 1.20 (H) 11/24/2016    PROT 5.0 (L) 12/02/2016    ALBUMIN 2.7 (L) 12/02/2016    ALT 110 (H) 12/02/2016    AST 22 12/02/2016    ALKPHOS 367 (H) 12/02/2016    GGT 261 (H) 11/24/2016       Lab Results   Component Value Date    LABPROT 14.5 (H) 01/09/2014    INR 2.90 12/02/2016    APTT 136.7 (H) 11/20/2016       Imaging: None

## 2016-12-03 NOTE — Unmapped (Addendum)
Problem: Patient Care Overview  Goal: Plan of Care Review  Outcome: Progressing  Patient alert and oriented X3. Bed in low, locked position. Call bell and phone within reach. Meds administered via gtube. Patient refused 1400 and 1800 tube feed bolus.Continent of bowel and bladder. Will continue to monitor.     Problem: Self-Care Deficit (Adult,Obstetrics,Pediatric)  Goal: Identify Related Risk Factors and Signs and Symptoms  Related risk factors and signs and symptoms are identified upon initiation of Human Response Clinical Practice Guideline (CPG).   Outcome: Progressing      Problem: Fall Risk (Adult)  Goal: Identify Related Risk Factors and Signs and Symptoms  Related risk factors and signs and symptoms are identified upon initiation of Human Response Clinical Practice Guideline (CPG).   Outcome: Progressing      Problem: Activity Intolerance (Adult)  Goal: Identify Related Risk Factors and Signs and Symptoms  Related risk factors and signs and symptoms are identified upon initiation of Human Response Clinical Practice Guideline (CPG).   Outcome: Progressing      Problem: Nutrition, Imbalanced: Inadequate Oral Intake (Adult)  Goal: Identify Related Risk Factors and Signs and Symptoms  Related risk factors and signs and symptoms are identified upon initiation of Human Response Clinical Practice Guideline (CPG).   Outcome: Progressing      Problem: Infection, Risk/Actual (Adult)  Goal: Identify Related Risk Factors and Signs and Symptoms  Related risk factors and signs and symptoms are identified upon initiation of Human Response Clinical Practice Guideline (CPG).   Outcome: Progressing      Problem: Skin Injury Risk (Adult)  Goal: Identify Related Risk Factors and Signs and Symptoms  Related risk factors and signs and symptoms are identified upon initiation of Human Response Clinical Practice Guideline (CPG).   Outcome: Progressing      Problem: VTE, DVT and PE (Adult)  Goal: Signs and Symptoms of Listed Potential Problems Will be Absent, Minimized or Managed (VTE, DVT and PE)  Signs and symptoms of listed potential problems will be absent, minimized or managed by discharge/transition of care (reference VTE, DVT and PE (Adult) CPG).   Outcome: Progressing

## 2016-12-03 NOTE — Unmapped (Signed)
Physical Medicine and Rehabilitation  Daily Progress Note [facility]    ASSESSMENT:     ??Jeffrey Ward??is a 66 y.o.??male??admitted to [facility] for physical rehabilitation s/p liver transplant (09/15/16).  ??  Primary Rehab diagnosis: (Debility) 16 Debility (Non-Cardiac/Non-Pulmonary)    PLAN:     REHAB:   - PT and OT to maximize functional status with mobility and ADLs as well as prevention of joint contracture.   - SLP for cognitive and swallow function.  - RT for community re-integration, education, and leisure support services.  - Pharmacy consult for patient and family education on medication management.   - To be discussed in weekly Interdisciplinary Team Conference.  ??  S/p Liver transplant (09/15/16) For chronic hep C and HCC: Liver ultrasound on 11/03/16 showing mild to moderate intrahepatic biliary duct dilation which appeared to be new from prior ultrasound. Liver biopsy on 11/04/16 did not show acute rejection.The patient had significantly worsening LFTs on 11/25/16 And transplant ultrasound was performed showing RIs stable, vessels patent, mild increased dilation of CBD. ERCP was performed by GI at the recommendation of transplant on 11/29/16 and 2 stents were placed. Biopsy was obtained. The patient's LFTs have trended down as of 12/01/16.   - f/u transplant recs  - Continue cyclosporine, CellCept  - continue Valcyte, Bactrim for ppx  - CTM LFTs on daily CMP  ??  GIB: Patient had GI bleed from an ulcer which was clipped on 11/20/16 by gastroenterology.   - Protonix 40 mg BID  ??  Paroxismal A-fib: Patient on digoxin, amiodarone, warfarin for a-fib. Patient recently had GI bleed and warfarin was restarted on 9/11. INR was 1.69 on 11/24/16. Goal INR is 2-3. The patient's INR was 4.71 on 11/30/16 this is likely due to an interaction of the warfarin with Zosyn which was prescribed prophylactically after biopsy. The patient only needed 24 hours of Zosyn. Warfarin was held for 1 dose on 11/30/16 due to the elevated INR. It had come back down to 2.9 the following day and warfarin was restarted as the held dose likely would not show up as a change in INR until the following day. INR was again 2.9 on 12/02/16. Will monitor INR over the weekend and adjust as appropriate.  - Continue amiodarone, digoxin, warfarin  - follow up cardiology as outpatient  ??  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  ??  Right Heart Failure. Stable: Patient reportedly had right heart failure postop after transplant.   ??  Severe PAH: The patient's wife reports that he was told that he had pulmonary hypertension prior to admission to hospital. 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  DM:   - Endocrine following, follow-up recommendations  ??  Delirium: Patient was on Zyprexa 7.5 mg nightly which was held due to prolonged QTc. No longer having issues with delerium  - QTc 412 on 9/19  ??  Dysphonia with dysphagia: The patient has been intubated multiple times throughout his hospitalization. Voice has been hoarse/hypophonic since intubations. 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 They believe that this is possibly secondary to multiple intubations and would like follow-up with him in 3-4 months to evaluate recovery of nerve function and discussed possible injection laryngoplasty  - currently on tube feeds  - Follow-up with ENT in 3-4 months  ??  Nausea/Vomiting: Patient had nausea and vomiting with tube feeds as recently as  the morning of 11/24/16. This was thought to be due to high residuals. Tube feed rate was slowed. We'll continue to monitor for symptoms and continue to communicate with dietitian/nutritionist about tube feed recommendations.  ??  Distal LE weakness/dysesthesias: On AIR admission, patient with left lower extremity weakness and numbness on the dorsal aspect of the left foot since surgery. He reported that he has developed some numbness to the right great toe. The patient developed a spreading dysesthesias of his legs to the mid tibial area bilaterally the day following admission to rehabilitation. 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 Neurology was consulted and believed that the symptoms are consistent with a radiculopathy. They recommended MRI lumbar spine which showed mild stenosis at L4-L5 and L5-S1. I spoke with Dr. Irven Easterly with neurology who stated they will come back to reevaluate him this weekend or early next week  - Follow-up neuro recs    DISPO: Patient to be discussed at weekly interdisciplinary team conference.   - ELOS: 9/29     SUBJECTIVE:     NAEON. The patient denies any new complaints today. He states that he has not had nausea or vomiting today. He denies chills, chest pain, shortness of breath, headache, abdominal pain. The patient reports that he does not believe his numbness/tingling or weakness had gotten better or worse. After the exam bilateral know that his exam is better today than yesterday. He is happy to hear this.    The patient was seen by ENT yesterday who found right vocal fold paralysis on laryngoscopy. They believe that this is possibly secondary to multiple intubations and would like follow-up with him in 3-4 months to evaluate recovery of nerve function and discussed possible injection laryngoplasty.    OBJECTIVE:     Vital signs (last 24 hours):  Temp:  [36.6 ??C] 36.6 ??C  Heart Rate:  [70-75] 70  Resp:  [16-17] 17  BP: (125-160)/(61-74) 160/74  SpO2:  [91 %-94 %] 93 %  BMI (Calculated):  [23.5] 23.5    Physical Exam:  GEN: NAD sitting in bed comfortably  EYES: EOMI  HENT: Hearing adequate for conversation, soft hoarse voice  RESP: NWOB on RA  CV: RRR  ABD: Nondistended  SKIN: no visible rashes   MSK: no notable contractures  NEURO: follows commands well and answers questions appropriately, the patient has decreased sensation distally from about 2 inches proximal to the left ankle and no sensation in his toes on that foot. The patient has full sensation in his right lower extremity proximal to his toes but has decreased sensation on all 5 toes. This is an improvement from yesterday. He is moving both feet in dorsiflexion, plantarflexion.    PSYCH: mood euthymic, affect appropriate    I saw and evaluated the patient, participating in the key portions of the service.  I reviewed the resident???s note.  I agree with the resident???s findings and plan. Thornton Dales, MD

## 2016-12-03 NOTE — Unmapped (Signed)
Physical Medicine and Rehabilitation  Daily Progress Note [facility]    ASSESSMENT:     ??Jeffrey Ward??is a 66 y.o.??male??admitted to [facility] for physical rehabilitation s/p liver transplant (09/15/16).  ??  Primary Rehab diagnosis: (Debility) 16 Debility (Non-Cardiac/Non-Pulmonary)    PLAN:     REHAB:   - PT and OT to maximize functional status with mobility and ADLs as well as prevention of joint contracture.   - SLP for cognitive and swallow function.  - RT for community re-integration, education, and leisure support services.  - Pharmacy consult for patient and family education on medication management.   - To be discussed in weekly Interdisciplinary Team Conference.  ??  S/p Liver transplant (09/15/16) For chronic hep C and HCC: Liver ultrasound on 11/03/16 showing mild to moderate intrahepatic biliary duct dilation which appeared to be new from prior ultrasound. Liver biopsy on 11/04/16 did not show acute rejection.The patient had significantly worsening LFTs on 11/25/16 And transplant ultrasound was performed showing RIs stable, vessels patent, mild increased dilation of CBD. ERCP was performed by GI at the recommendation of transplant on 11/29/16 and 2 stents were placed. Biopsy was obtained. The patient's LFTs have trended down as of 12/01/16.   - f/u transplant recs  - Continue cyclosporine, CellCept  - continue Valcyte, Bactrim for ppx  - CTM LFTs on daily CMP  ??  GIB: Patient had GI bleed from an ulcer which was clipped on 11/20/16 by gastroenterology.   - Protonix 40 mg BID  ??  Paroxismal A-fib: Patient on digoxin, amiodarone, warfarin for a-fib. Patient recently had GI bleed and warfarin was restarted on 9/11. INR was 1.69 on 11/24/16. Goal INR is 2-3. The patient's INR was 4.71 on 11/30/16 this is likely due to an interaction of the warfarin with Zosyn which was prescribed prophylactically after biopsy. The patient only needed 24 hours of Zosyn. Warfarin was held for 1 dose on 11/30/16 due to the elevated INR. It had come back down to 2.9 the following day and warfarin was restarted as the held dose likely would not show up as a change in INR until the following day. INR was again 2.9 on 12/02/16. Will monitor INR over the weekend and adjust as appropriate.  - Continue amiodarone, digoxin, warfarin  - follow up cardiology as outpatient  ??  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  ??  Right Heart Failure. Stable: Patient reportedly had right heart failure postop after transplant.   ??  Severe PAH: The patient's wife reports that he was told that he had pulmonary hypertension prior to admission to hospital. 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  DM:   - Endocrine following, follow-up recommendations  ??  Delirium: Patient was on Zyprexa 7.5 mg nightly which was held due to prolonged QTc. No longer having issues with delerium  - QTc 412 on 9/19  ??  Dysphonia with dysphagia: The patient has been intubated multiple times throughout his hospitalization. Voice has been hoarse/hypophonic since intubations. ENT consulted.   - currently on tube feeds  - ENT consult, f/u recs   ??  Nausea/Vomiting: Patient had nausea and vomiting with tube feeds as recently as the morning of 11/24/16. This was thought to be due to high residuals. Tube feed rate was slowed. We'll continue to monitor for symptoms and continue to communicate with dietitian/nutritionist about tube feed recommendations.  ??  Distal LE weakness/dysesthesias: On AIR  admission, patient with left lower extremity weakness and numbness on the dorsal aspect of the left foot since surgery. He reported that he has developed some numbness to the right great toe. The patient developed a spreading dysesthesias of his legs to the mid tibial area bilaterally the day following admission to rehabilitation. 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 Neurology was consulted and believed that the symptoms are consistent with a radiculopathy. They recommended MRI lumbar spine which showed mild stenosis at L4-L5 and L5-S1. I spoke with Dr. Irven Easterly with neurology who stated they will come back to reevaluate him this weekend or early next week  The MRI is back.  - Follow-up neuro recs    DISPO: Patient to be discussed at weekly interdisciplinary team conference.   - ELOS: 9/26     SUBJECTIVE:     NAEON. The patient had an episode of nausea and vomiting with his tube feeds today. He denied any other new symptoms today and report that his nausea has since resolved. He states that his numbness and tingling as well as his weakness are consistent with yesterday. He is working with speech therapy in his room. Speech states that he did phonate today for the first time but his speech still is not coming back as quickly as they would expect. They agreed with ENT consult. The patient was informed that ENT would be consulted. He agreed with this plan. He was also aware of the plan to get MRI of his lumbar spine done today. He was in agreement with this plan as well.    OBJECTIVE:     Vital signs (last 24 hours):  Temp:  [36.4 ??C] 36.4 ??C  Heart Rate:  [69-75] 71  Resp:  [16-18] 16  BP: (125-154)/(61-74) 134/63  SpO2:  [91 %-98 %] 94 %  BMI (Calculated):  [23.5] 23.5    Physical Exam:  GEN: NAD sitting in bed with speech therapy at bedside  EYES: EOMI  HENT: Hearing adequate for conversation, soft hoarse voice  RESP: NWOB on RA  CV: RRR  ABD: Nondistended  SKIN: no visible rashes   MSK: no notable contractures  NEURO: follows commands well and answers questions appropriately, no sensation on the left foot to his ankle with hypoesthesia to the mid tibial area. Hypoesthesia distal to the mid tibial area in the right lower extremity.   PSYCH: mood euthymic, affect appropriate

## 2016-12-04 LAB — COMPREHENSIVE METABOLIC PANEL
ALBUMIN: 2.6 g/dL — ABNORMAL LOW (ref 3.5–5.0)
ALKALINE PHOSPHATASE: 265 U/L — ABNORMAL HIGH (ref 38–126)
ALT (SGPT): 70 U/L (ref 19–72)
ANION GAP: 3 mmol/L — ABNORMAL LOW (ref 9–15)
AST (SGOT): 21 U/L (ref 19–55)
BILIRUBIN TOTAL: 1.1 mg/dL (ref 0.0–1.2)
BLOOD UREA NITROGEN: 17 mg/dL (ref 7–21)
CALCIUM: 8.6 mg/dL (ref 8.5–10.2)
CHLORIDE: 100 mmol/L (ref 98–107)
CO2: 31 mmol/L — ABNORMAL HIGH (ref 22.0–30.0)
CREATININE: 0.9 mg/dL (ref 0.70–1.30)
EGFR MDRD AF AMER: >=60 mL/min/{1.73_m2} (ref >=60)
EGFR MDRD NON AF AMER: >=60 mL/min/{1.73_m2} (ref >=60)
GLUCOSE RANDOM: 205 mg/dL — ABNORMAL HIGH (ref 65–179)
POTASSIUM: 4.1 mmol/L (ref 3.5–5.0)
PROTEIN TOTAL: 4.8 g/dL — ABNORMAL LOW (ref 6.5–8.3)
SODIUM: 134 mmol/L — ABNORMAL LOW (ref 135–145)

## 2016-12-04 LAB — INR: Lab: 2.34

## 2016-12-04 LAB — PROTIME-INR: INR: 2.34

## 2016-12-04 LAB — ANION GAP: Anion gap 3:SCnc:Pt:Ser/Plas:Qn:: 3 — ABNORMAL LOW

## 2016-12-04 NOTE — Unmapped (Signed)
Physical Medicine and Rehabilitation  Daily Progress Note [facility]    ASSESSMENT:     ??Jeffrey Ward??is a 66 y.o.??male??admitted to [facility] for physical rehabilitation s/p liver transplant (09/15/16).  ??  Primary Rehab diagnosis: (Debility) 16 Debility (Non-Cardiac/Non-Pulmonary)    PLAN:     REHAB:   - PT and OT to maximize functional status with mobility and ADLs as well as prevention of joint contracture.   - SLP for cognitive and swallow function.  - RT for community re-integration, education, and leisure support services.  - Pharmacy consult for patient and family education on medication management.   - To be discussed in weekly Interdisciplinary Team Conference.  ??  S/p Liver transplant (09/15/16) For chronic hep C and HCC: Liver ultrasound on 11/03/16 showing mild to moderate intrahepatic biliary duct dilation which appeared to be new from prior ultrasound. Liver biopsy on 11/04/16 did not show acute rejection.The patient had significantly worsening LFTs on 11/25/16 And transplant ultrasound was performed showing RIs stable, vessels patent, mild increased dilation of CBD. ERCP was performed by GI at the recommendation of transplant on 11/29/16 and 2 stents were placed. Biopsy was obtained. The patient's LFTs have trended down as of 12/01/16.   - f/u transplant recs  - Continue cyclosporine, CellCept  - continue Valcyte, Bactrim for ppx  - CTM LFTs on daily CMP  ??  GIB: Patient had GI bleed from an ulcer which was clipped on 11/20/16 by gastroenterology.   - Protonix 40 mg BID  ??  Paroxismal A-fib: Patient on digoxin, amiodarone, warfarin for a-fib. Patient recently had GI bleed and warfarin was restarted on 9/11. INR was 1.69 on 11/24/16. Goal INR is 2-3. The patient's INR was 4.71 on 11/30/16 this is likely due to an interaction of the warfarin with Zosyn which was prescribed prophylactically after biopsy. The patient only needed 24 hours of Zosyn. Warfarin was held for 1 dose on 11/30/16 due to the elevated INR. It had come back down to 2.9 the following day and warfarin was restarted.  - Continue amiodarone, digoxin, warfarin  - follow up cardiology as outpatient  ??  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  ??  Right Heart Failure. Stable: Patient reportedly had right heart failure postop after transplant.   ??  Severe PAH: The patient's wife reports that he was told that he had pulmonary hypertension prior to admission to hospital. 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  DM:   - Endocrine following, follow-up recommendations  ??  Delirium: Patient was on Zyprexa 7.5 mg nightly which was held due to prolonged QTc. No longer having issues with delerium  - QTc 412 on 9/19  ??  Dysphonia with dysphagia: The patient has been intubated multiple times throughout his hospitalization. Voice has been hoarse/hypophonic since intubations. 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 They believe that this is possibly secondary to multiple intubations and would like follow-up with him in 3-4 months to evaluate recovery of nerve function and discussed possible injection laryngoplasty  - currently on tube feeds  - Follow-up with ENT in 3-4 months  ??  Nausea/Vomiting: Patient had nausea and vomiting with tube feeds as recently as the morning of 11/24/16. This was thought to be due to high residuals. Tube feed rate was slowed. We'll continue to monitor for symptoms and continue to communicate with dietitian/nutritionist about tube feed recommendations.  -  Scopolamine patch  ??  Distal LE weakness/dysesthesias: On AIR admission, patient with left lower extremity weakness and numbness on the dorsal aspect of the left foot since surgery. He reported that he has developed some numbness to the right great toe. The patient developed a spreading dysesthesias of his legs to the mid tibial area bilaterally the day following admission to rehabilitation. 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 Neurology was consulted and believed that the symptoms are consistent with a radiculopathy. They recommended MRI lumbar spine which showed mild stenosis at L4-L5 and L5-S1. I spoke with Dr. Irven Easterly with neurology who stated they will come back to reevaluate him this weekend or early next week  - Follow-up neuro recs    DISPO: Patient to be discussed at weekly interdisciplinary team conference.   - ELOS: 9/29     SUBJECTIVE:     NAEON. The patient had some nausea with his tube feeds this morning. He states that he got a little short of breath when he was nauseated and asked for the oxygen to be put on. He never desaturated. We took the oxygen off and put a pulse ox on for a few minutes. He was saturating at 94%. We decided to leave the oxygen off as he was no longer feeling short of breath. The patient denied any other complaints today.    We talked about adding a scopolamine patch for nausea and the patient was in agreement with trying this.    OBJECTIVE:     Vital signs (last 24 hours):  Temp:  [36.6 ??C-36.7 ??C] 36.7 ??C  Heart Rate:  [72] 72  Resp:  [16-18] 18  BP: (144-153)/(70-72) 144/70  SpO2:  [95 %-97 %] 95 %    Physical Exam:  GEN: NAD, Laying in bed  EYES: EOMI  HENT: Hearing adequate for conversation, soft hoarse voice  RESP: NWOB on 2 L of oxygen. We took the oxygen off and he was breathing normally on room air without increased work of breathing  CV: RRR  ABD: Nondistended  SKIN: no visible rashes   MSK: no notable contractures  NEURO: follows commands well and answers questions appropriately, the patient has decreased sensation distally to his mid tibial area on the left lower extremity (60% of normal) with no sensation in his toes on that foot. Decreased sensation to soft touch in the right lower extremity distally to about 2 inches proximal to the ankle joint (60% of normal). He has worsened decreased sensation on all 5 toes (30% of normal). Percents were obtained by telling him that one dollar was full sensation and asking how many cents of sensation he had  PSYCH: mood euthymic, affect appropriate??????    I saw and evaluated the patient, participating in the key portions of the service.  I reviewed the resident???s note.  I agree with the resident???s findings and plan. Thornton Dales, MD

## 2016-12-04 NOTE — Unmapped (Signed)
Problem: Patient Care Overview  Goal: Plan of Care Review  Outcome: Progressing   12/02/16 1555   OTHER   Plan of Care Reviewed With patient   Plan of Care Review   Progress improving   Patient is A&O. Patient is continent of bowel and bladder, last BM was 12/02/16. Mech soft diet, honey thick liquids. Patient denied pain during shift.  Safety and skin protocol maintained. Bed in lowest position, call bell within reach, will continue to monitor.     Goal: Discharge Needs Assessment  Outcome: Progressing   11/25/16 0950 11/25/16 1209   OTHER   Equipment Currently Used at Home --  cane, straight;glucometer  (Used a cane occasionally. )   Patient and/or family were provided with choice of facilities / services that are available and appropriate to meet post hospital care needs? --  N/A   Discharge Needs Assessment   Concerns to be Addressed --  adjustment to diagnosis/illness;care coordination/care conferences;discharge planning   Readmission Within the Last 30 Days --  planned readmission  (Acute to AIR admission)   Transportation Anticipated family or friend will provide --    Anticipated Changes Related to Illness --  inability to care for self   Equipment Needed After Discharge --  other (see comments)  (Possibly oxygen, will try to wean patient. TBD regarding other HME)   Discharge Facility/Level of Care Needs --  other (see comments)  (TBD)     Goal: Interprofessional Rounds/Family Conf  Outcome: Progressing   11/25/16 1209   OTHER   Clinical EDD (Estimated Discharge Date)  (Will team on Tuesday)       Problem: Self-Care Deficit (Adult,Obstetrics,Pediatric)  Goal: Identify Related Risk Factors and Signs and Symptoms  Related risk factors and signs and symptoms are identified upon initiation of Human Response Clinical Practice Guideline (CPG).   Outcome: Progressing   12/02/16 1555   Self-Care Deficit (Adult,Obstetrics,Pediatric)   Related Risk Factors (Self-Care Deficit) activity intolerance;disease process;medication effects;pain/discomfort;surgery   Signs and Symptoms (Self-Care Deficit) decreased balance;gait unstable;weakness, paresis, paralysis     Goal: Improved Ability to Perform BADL and IADL  Patient will demonstrate the desired outcomes by discharge/transition of care.   Outcome: Progressing   12/04/16 1321   Self-Care Deficit (Adult,Obstetrics,Pediatric)   Improved Ability to Perform BADL and IADL making progress toward outcome       Problem: Fall Risk (Adult)  Goal: Identify Related Risk Factors and Signs and Symptoms  Related risk factors and signs and symptoms are identified upon initiation of Human Response Clinical Practice Guideline (CPG).   Outcome: Progressing   12/02/16 1555   Fall Risk (Adult)   Related Risk Factors (Fall Risk) fatigue/slow reaction   Signs and Symptoms (Fall Risk) presence of risk factors     Goal: Absence of Fall  Patient will demonstrate the desired outcomes by discharge/transition of care.   Outcome: Progressing   12/04/16 1321   Fall Risk (Adult)   Absence of Fall making progress toward outcome       Problem: Activity Intolerance (Adult)  Goal: Identify Related Risk Factors and Signs and Symptoms  Related risk factors and signs and symptoms are identified upon initiation of Human Response Clinical Practice Guideline (CPG).   Outcome: Progressing   12/02/16 1555   Activity Intolerance (Adult)   Related Risk Factors (Activity Intolerance) generalized weakness   Signs and Symptoms (Activity Intolerance) nausea;pain/discomfort     Goal: Activity Tolerance  Patient will demonstrate the desired outcomes by discharge/transition of care.  Outcome: Progressing   12/04/16 1321   Activity Intolerance (Adult)   Activity Tolerance making progress toward outcome     Goal: Effective Energy Conservation Techniques  Patient will demonstrate the desired outcomes by discharge/transition of care.   Outcome: Progressing   12/04/16 1321   Activity Intolerance (Adult)   Effective Energy Conservation Techniques making progress toward outcome       Problem: Nutrition, Imbalanced: Inadequate Oral Intake (Adult)  Goal: Identify Related Risk Factors and Signs and Symptoms  Related risk factors and signs and symptoms are identified upon initiation of Human Response Clinical Practice Guideline (CPG).   Outcome: Progressing   12/02/16 1555   Nutrition, Imbalanced: Inadequate Oral Intake (Adult)   Related Risk Factors (Nutrition Imbalance, Inadequate Oral Intake) eating aversion   Signs and Symptoms (Nutrition Imbalance, Inadequate Oral Intake: Signs and Symptoms) nausea and vomiting     Goal: Improved Oral Intake  Patient will demonstrate the desired outcomes by discharge/transition of care.   Outcome: Progressing   12/04/16 1321   Nutrition, Imbalanced: Inadequate Oral Intake (Adult)   Improved Oral Intake making progress toward outcome     Goal: Prevent Further Weight Loss  Patient will demonstrate the desired outcomes by discharge/transition of care.   Outcome: Progressing   12/04/16 1321   Nutrition, Imbalanced: Inadequate Oral Intake (Adult)   Prevent Further Weight Loss making progress toward outcome       Problem: Infection, Risk/Actual (Adult)  Goal: Identify Related Risk Factors and Signs and Symptoms  Related risk factors and signs and symptoms are identified upon initiation of Human Response Clinical Practice Guideline (CPG).   Outcome: Progressing   12/02/16 1555   Infection, Risk/Actual (Adult)   Related Risk Factors (Infection, Risk/Actual) prolonged hospitalization;medication effects;surgery/procedure   Signs and Symptoms (Infection, Risk/Actual) feeding/eating intolerance;vomiting;weakness     Goal: Infection Prevention/Resolution  Patient will demonstrate the desired outcomes by discharge/transition of care.   Outcome: Progressing   12/04/16 1321   Infection, Risk/Actual (Adult)   Infection Prevention/Resolution making progress toward outcome       Problem: Skin Injury Risk (Adult)  Goal: Identify Related Risk Factors and Signs and Symptoms  Related risk factors and signs and symptoms are identified upon initiation of Human Response Clinical Practice Guideline (CPG).   Outcome: Progressing   12/02/16 1555   Skin Injury Risk (Adult)   Related Risk Factors (Skin Injury Risk) cognitive impairment;hospitalization prolonged;mobility impaired     Goal: Skin Health and Integrity  Patient will demonstrate the desired outcomes by discharge/transition of care.   Outcome: Progressing   12/04/16 1321   Skin Injury Risk (Adult)   Skin Health and Integrity making progress toward outcome       Problem: VTE, DVT and PE (Adult)  Goal: Signs and Symptoms of Listed Potential Problems Will be Absent, Minimized or Managed (VTE, DVT and PE)  Signs and symptoms of listed potential problems will be absent, minimized or managed by discharge/transition of care (reference VTE, DVT and PE (Adult) CPG).   Outcome: Progressing   12/04/16 1321   VTE, DVT and PE (Adult)   Problems Assessed (VTE, DVT, PE) all   Problems Present (VTE, DVT, PE) none

## 2016-12-04 NOTE — Unmapped (Signed)
Problem: Patient Care Overview  Goal: Plan of Care Review  Outcome: Progressing    Resting quietly.  Voiced no c/o of pain.  Tolerated tube feed well.  Using urinal.  No BM this shift.   Will conitnue to monitor and provide a safe environment..    Problem: Fall Risk (Adult)  Goal: Identify Related Risk Factors and Signs and Symptoms  Related risk factors and signs and symptoms are identified upon initiation of Human Response Clinical Practice Guideline (CPG).   Outcome: Progressing   12/02/16 1555   Fall Risk (Adult)   Related Risk Factors (Fall Risk) fatigue/slow reaction   Signs and Symptoms (Fall Risk) presence of risk factors       Problem: Infection, Risk/Actual (Adult)  Goal: Identify Related Risk Factors and Signs and Symptoms  Related risk factors and signs and symptoms are identified upon initiation of Human Response Clinical Practice Guideline (CPG).   Outcome: Progressing   12/02/16 1555   Infection, Risk/Actual (Adult)   Related Risk Factors (Infection, Risk/Actual) prolonged hospitalization;medication effects;surgery/procedure   Signs and Symptoms (Infection, Risk/Actual) feeding/eating intolerance;vomiting;weakness       Problem: Skin Injury Risk (Adult)  Goal: Skin Health and Integrity  Patient will demonstrate the desired outcomes by discharge/transition of care.   Outcome: Progressing   12/02/16 1555   Skin Injury Risk (Adult)   Skin Health and Integrity making progress toward outcome       Problem: VTE, DVT and PE (Adult)  Goal: Signs and Symptoms of Listed Potential Problems Will be Absent, Minimized or Managed (VTE, DVT and PE)  Signs and symptoms of listed potential problems will be absent, minimized or managed by discharge/transition of care (reference VTE, DVT and PE (Adult) CPG).   Outcome: Progressing   12/02/16 1555   VTE, DVT and PE (Adult)   Problems Assessed (VTE, DVT, PE) all   Problems Present (VTE, DVT, PE) none

## 2016-12-05 LAB — COMPREHENSIVE METABOLIC PANEL
ALBUMIN: 2.8 g/dL — ABNORMAL LOW (ref 3.5–5.0)
ALKALINE PHOSPHATASE: 256 U/L — ABNORMAL HIGH (ref 38–126)
ALT (SGPT): 64 U/L (ref 19–72)
ANION GAP: 6 mmol/L — ABNORMAL LOW (ref 9–15)
AST (SGOT): 26 U/L (ref 19–55)
BILIRUBIN TOTAL: 1.3 mg/dL — ABNORMAL HIGH (ref 0.0–1.2)
BLOOD UREA NITROGEN: 15 mg/dL (ref 7–21)
BUN / CREAT RATIO: 16
CALCIUM: 9 mg/dL (ref 8.5–10.2)
CREATININE: 0.93 mg/dL (ref 0.70–1.30)
EGFR MDRD AF AMER: >=60 mL/min/{1.73_m2} (ref >=60)
EGFR MDRD NON AF AMER: >=60 mL/min/{1.73_m2} (ref >=60)
GLUCOSE RANDOM: 139 mg/dL (ref 65–179)
PROTEIN TOTAL: 5.1 g/dL — ABNORMAL LOW (ref 6.5–8.3)
SODIUM: 136 mmol/L (ref 135–145)

## 2016-12-05 LAB — CYCLOSPORINE (FPIA) BLOOD: Lab: 286

## 2016-12-05 LAB — GLUCOSE RANDOM: Glucose:MCnc:Pt:Ser/Plas:Qn:: 139

## 2016-12-05 LAB — PREALBUMIN: Prealbumin:MCnc:Pt:Ser/Plas:Qn:: 22.5

## 2016-12-05 LAB — INR: Lab: 2.09

## 2016-12-05 NOTE — Unmapped (Signed)
[facility]   Psychiatry Follow-up Consult Note    Service Date: December 05, 2016  LOS:  LOS: 11 days     Assessment:    Jeffrey Ward is a 66 y.o. 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  Psychiatry was consulted by Delane Ginger, MD [facility] resident for continued f/u of delirium. Today, Jeffrey Ward is being seen in consultation for evaluation of medication management following transfer to [facility] 9/13.    At this time, the patient's presentation continues to be consistent with resolved delirium.  Pt has not been on Zyprexa since 9/9 in the setting of QTc prolongation, and is not currently demonstrating disorientation or inattention at this time. He would benefit from targeting improvement in sleep. Recommend providing low-dose trazodone as needed. Patient remains at high risk of recurrent delirium and mental status should continue to be monitored closely.    Safety Assessment:   No acute safety concerns identified. In setting of recent delirium, pt at increased risk for recurrent confusion, agitation.     Diagnoses:   Active Hospital problems:  Active Problems:    Mixed level of activity delirium due to multiple etiologies, resolved    Insomnia      Recommendations:   -- Safety Recommendations: No acute safety concerns at this time.    -- Medication Recommendations:    Continue melatonin 6 mg po/ngt qHS  START trazodone 25 mg po/ngt qhs PRN insomnia  Continue thiamine 100 mg po/ngt daily   Continue multivitamin and folate 1 mg po/ngt daily    -- Disposition: Deferred.    -- Behavioral Recommendations: Recommend delirium precautions/protocol. Continue frequent mental status exams as pt at high risk of recurrence of delirium.     Thank you for this consult request. Recommendations have been communicated to the primary team.  We will sign off at this time. Please page [phone] or [phone] (after hours/weekends) for any questions or concerns. Discussed with and seen by Attending, Torrie Mayers, MD, who agrees with the assessment and plan.     Laurine Blazer, MD  I saw and evaluated the patient, participating in the key portions of the service.  I reviewed and edited the resident???s note.  I agree with the resident???s findings and plan. Pt doing well with resolved delirium. Continue to monitor mental status given numerous medical comorbidities and previous delirium -- as pt remains at elevated risk for recurrent delirium. Use low dose trazodone to target insomnia (could schedule and have PRN if PRN alone ineffective). We will go ahead and sign off but please let us know if any additional psychiatric needs arise in the future -- we are happy to see patient again.     Elray Buba, MD      Interval History   Updates of hospital course since last seen by psychiatry: Transferred to [facility] 9/13.  Cyclosporine level 9/17 increased to 517ng/ml, though pharmacy did not feel this was a true trough and will have it redrawn 9/20.  Had increasing bilirubin 9/13 which rose to 3.4mg /dl 1/91 but ERCP not done as it returned to 1.6mg /dl over the weekend.  Though increased again to 3.2 9/17, and again to 3.4mg /dl 4/78 with increases in all LFT's as well.  Plan is for ERCP 9/18 pending results of ECHO.    Patient Interview: [name] with RN and primary resident present.  Jeffrey Ward said that he was doing okay.  He's  still having back pain which has been going on for a while.  He also endorsed difficulty sleeping.  He denied that his mind was playing tricks on him, and denied any AH or VH.  He also denied any thoughts of self harm or SI.  Regarding attention testing he was able to state the months of the year backwards.      ROS:   Gen: positive for Back pain    Current Medications:  Scheduled Meds:  ??? amiodarone  200 mg PEG Tube Daily   ??? cycloSPORINE  250 mg PEG Tube BID   ??? digoxin  62.5 mcg PEG Tube Daily   ??? docusate  100 mg G-tube BID   ??? insulin NPH  6 Units Subcutaneous BID   ??? insulin regular  0-12 Units Subcutaneous Q6H SCH   ??? insulin regular  4 Units Subcutaneous 3xd Meals   ??? lidocaine  1 patch Transdermal Daily   ??? melatonin  6 mg PEG Tube Nightly   ??? mycophenolate  500 mg G-tube BID   ??? pantoprazole  40 mg G-tube BID   ??? polyethylene glycol  17 g G-tube BID   ??? scopolamine  1 patch Topical Q72H   ??? sildenafil (antihypertensive)  30 mg PEG Tube [facility]   ??? sulfamethoxazole-trimethoprim  10 mL PEG Tube Once per day on Mon Wed Fri   ??? thiamine  100 mg PEG Tube Daily   ??? valGANciclovir  450 mg PEG Tube Daily   ??? warfarin  3 mg Oral Daily     Continuous Infusions:    PRN Meds:.albuterol, dextrose 50 % in water (D50W), ocular lubricant, ondansetron, saliva stimulant comb. no.3, traMADol      Objective   Vitals:  Temp:  [36.5 ??C-36.8 ??C] 36.8 ??C  Heart Rate:  [73-75] 73  Resp:  [18] 18  BP: (135-140)/(60-69) 140/69  SpO2:  [95 %] 95 %    GEN: no acute distress, endorses back pain    Mental Status Exam:  Appearance:  appears stated age, laying in bed.   Attitude:   calm, cooperative and polite   Behavior/Psychomotor:  appropriate eye contact, no abnormal movements and no psychomotor agitation   Speech/Language:   normal rate, tone, fluency, but low volume as pt speaks in a whisper d/t recent trach   Mood:  ???Okay???   Affect:  calm, cooperative and mood congruent   Thought process:  logical, linear, clear, coherent, goal directed   Thought content:    denies thoughts of self-harm. Denies SI, plans, or intent.  No grandiose, self-referential, persecutory, or paranoid delusions noted.   Perceptual disturbances:   denies auditory and visual hallucinations and behavior not concerning for response to internal stimuli   Attention:  able to fully attend without fluctuations in consciousness and on months of the year backwards stated them successfully.   Concentration: Able to fully concentrate and attend   Orientation:  Oriented to person, place, time, and general circumstances Memory:  Immediate, short-term, long-term, and recall grossly intact   Fund of knowledge:   not formally assessed   Insight:    Fair   Judgment:   Fair   Impulse Control:  Fair     Data Reviewed:  I reviewed labs from the last 24 hours.

## 2016-12-05 NOTE — Unmapped (Addendum)
Wife called me back this morning and confirmed that she has set up delivery for Thursday.   Caryl Ada Inpatient Transplant Nurse Cordinator 12/05/2016 12:00 PM      Spoke with pt's wife, [name] regarding valcyte delivery and she said she would call today to have manufacturer ship. Reported that since pt didn't have d/c date last time she called, [employer] wouldn't ship med. Wife reported d/c date of Sat 9/29. Will follow up with wife later this week to ensure med delivery  Caryl Ada Inpatient Transplant Nurse Cordinator 12/05/2016 9:18 AM

## 2016-12-05 NOTE — Unmapped (Signed)
Problem: Patient Care Overview  Goal: Plan of Care Review  Outcome: Progressing   12/02/16 1555   OTHER   Plan of Care Reviewed With patient   Plan of Care Review   Progress improving     Resting quietly.  Refused tube feeds, then agreed to take half the amount over 1hr.  Pt stated that he did not want 6AM tube feed.  Has been continent of amber urine.  Using urinal.  Pt stated that he had a bowel movement today.  Resting quietly.  Medicated once for back pain with relief stated.  No safety issues noted.  Will continue to monitor and provide a safe environment.    Problem: Nutrition, Imbalanced: Inadequate Oral Intake (Adult)  Goal: Identify Related Risk Factors and Signs and Symptoms  Related risk factors and signs and symptoms are identified upon initiation of Human Response Clinical Practice Guideline (CPG).   Outcome: Progressing   12/02/16 1555   Nutrition, Imbalanced: Inadequate Oral Intake (Adult)   Related Risk Factors (Nutrition Imbalance, Inadequate Oral Intake) eating aversion   Signs and Symptoms (Nutrition Imbalance, Inadequate Oral Intake: Signs and Symptoms) nausea and vomiting       Problem: Infection, Risk/Actual (Adult)  Goal: Identify Related Risk Factors and Signs and Symptoms  Related risk factors and signs and symptoms are identified upon initiation of Human Response Clinical Practice Guideline (CPG).   Outcome: Progressing   12/02/16 1555   Infection, Risk/Actual (Adult)   Related Risk Factors (Infection, Risk/Actual) prolonged hospitalization;medication effects;surgery/procedure   Signs and Symptoms (Infection, Risk/Actual) feeding/eating intolerance;vomiting;weakness       Problem: Skin Injury Risk (Adult)  Goal: Identify Related Risk Factors and Signs and Symptoms  Related risk factors and signs and symptoms are identified upon initiation of Human Response Clinical Practice Guideline (CPG).   Outcome: Progressing   12/02/16 1555   Skin Injury Risk (Adult)   Related Risk Factors (Skin Injury Risk) cognitive impairment;hospitalization prolonged;mobility impaired       Problem: VTE, DVT and PE (Adult)  Goal: Signs and Symptoms of Listed Potential Problems Will be Absent, Minimized or Managed (VTE, DVT and PE)  Signs and symptoms of listed potential problems will be absent, minimized or managed by discharge/transition of care (reference VTE, DVT and PE (Adult) CPG).   Outcome: Progressing   12/04/16 1321   VTE, DVT and PE (Adult)   Problems Assessed (VTE, DVT, PE) all   Problems Present (VTE, DVT, PE) none

## 2016-12-05 NOTE — Unmapped (Signed)
Physical Medicine and Rehabilitation  Daily Progress Note [facility]    ASSESSMENT:     ??Jeffrey Ward??is a 66 y.o.??male??admitted to [facility] for physical rehabilitation s/p liver transplant (09/15/16).  ??  Primary Rehab diagnosis: (Debility) 16 Debility (Non-Cardiac/Non-Pulmonary)    PLAN:     REHAB:   - PT and OT to maximize functional status with mobility and ADLs as well as prevention of joint contracture.   - SLP for cognitive and swallow function.  - RT for community re-integration, education, and leisure support services.  - Pharmacy consult for patient and family education on medication management.   - To be discussed in weekly Interdisciplinary Team Conference.  ??  S/p Liver transplant (09/15/16) For chronic hep C and HCC: Liver ultrasound on 11/03/16 showing mild to moderate intrahepatic biliary duct dilation which appeared to be new from prior ultrasound. Liver biopsy on 11/04/16 did not show acute rejection.The patient had significantly worsening LFTs on 11/25/16 And transplant ultrasound was performed showing RIs stable, vessels patent, mild increased dilation of CBD. ERCP was performed by GI at the recommendation of transplant on 11/29/16 and 2 stents were placed. Biopsy was obtained. The patient's LFTs have trended down as of 12/01/16.   - f/u transplant recs  - Continue cyclosporine, CellCept  - continue Valcyte, Bactrim for ppx  - CTM LFTs on daily CMP  ??  GIB: Patient had GI bleed from an ulcer which was clipped on 11/20/16 by gastroenterology.   - Protonix 40 mg BID  ??  Paroxismal A-fib: Patient on digoxin, amiodarone, warfarin for a-fib. Patient recently had GI bleed and warfarin was restarted on 9/11. INR was 1.69 on 11/24/16. Goal INR is 2-3. The patient's INR was 4.71 on 11/30/16 this is likely due to an interaction of the warfarin with Zosyn which was prescribed prophylactically after biopsy. The patient only needed 24 hours of Zosyn. Warfarin was held for 1 dose on 11/30/16 due to the elevated INR. It had come back down to 2.9 the following day and warfarin was restarted.  - Continue amiodarone, digoxin, warfarin  - follow up cardiology as outpatient  ??  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  ??  Right Heart Failure. Stable: Patient reportedly had right heart failure postop after transplant.   ??  Severe PAH: The patient's wife reports that he was told that he had pulmonary hypertension prior to admission to hospital. 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  DM:   - Endocrine following, follow-up recommendations  ??  Delirium: Patient was on Zyprexa 7.5 mg nightly which was held due to prolonged QTc. No longer having issues with delerium  - QTc 412 on 9/19  ??  Dysphonia with dysphagia: The patient has been intubated multiple times throughout his hospitalization. Voice has been hoarse/hypophonic since intubations. 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 They believe that this is possibly secondary to multiple intubations and would like follow-up with him in 3-4 months to evaluate recovery of nerve function and discussed possible injection laryngoplasty  - currently on tube feeds  - Follow-up with ENT in 3-4 months  ??  Nausea/Vomiting: Patient had nausea and vomiting with tube feeds as recently as the morning of 11/24/16. This was thought to be due to high residuals. Tube feed rate was slowed. We'll continue to monitor for symptoms and continue to communicate with dietitian/nutritionist about tube feed recommendations.  -  Scopolamine patch  ??  Distal LE weakness/dysesthesias: On [facility] admission, patient with left lower extremity weakness and numbness on the dorsal aspect of the left foot since surgery. He reported that he has developed some numbness to the right great toe. The patient developed a spreading dysesthesias of his legs to the mid tibial area bilaterally the day following admission to rehabilitation. 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 Neurology was consulted and believed that the symptoms are consistent with a radiculopathy. They recommended MRI lumbar spine which showed mild stenosis at L4-L5 and L5-S1. I spoke with Dr. Irven Easterly with neurology who stated they will come back to reevaluate him this weekend or early next week  - Follow-up neuro recs    DISPO: Patient to be discussed at weekly interdisciplinary team conference.   - ELOS: 9/29     SUBJECTIVE:     NAEON. The patient denies any nausea or vomiting since yesterday. He states that he is tolerating the scopolamine patch well. He continues to feel mildly short of breath with therapies but has not had any desaturations. The patient believes that his numbness/tingling/weakness of his lower extremities has worsened since yesterday, though this is now worsened on exam. The patient denies any other new complaints today. We talked about the possibility of an EMG and let him know that neurology would likely come by and evaluate him today.    OBJECTIVE:     Vital signs (last 24 hours):  Temp:  [36.5 ??C-36.8 ??C] 36.8 ??C  Heart Rate:  [73-75] 73  Resp:  [18] 18  BP: (135-140)/(60-69) 140/69  SpO2:  [95 %] 95 %    Physical Exam:  GEN: NAD, Laying in bed  EYES: EOMI  HENT: Hearing adequate for conversation, soft hoarse voice  RESP: NWOB on RA  CV: RRR  ABD: Nondistended  SKIN: no visible rashes   MSK: no notable contractures  NEURO: follows commands well and answers questions appropriately, the patient has decreased sensation distally from the distal third of his left lower extremity (60% at the beginning and then decreasing gradually to his toes where he says he has no sensation.). Decreased sensation to soft touch in the right lower extremity distally to the ankle joint (60% of normal at the most proximal point and decreasing distally to the toes where he states he has no sensation). He has worsened decreased sensation on all 5 toes (30% of normal).  PSYCH: mood euthymic, affect appropriate

## 2016-12-05 NOTE — Unmapped (Signed)
Problem: Patient Care Overview  Goal: Plan of Care Review  Outcome: Progressing   12/02/16 1555   OTHER   Plan of Care Reviewed With patient   Plan of Care Review   Progress improving   Patient is A&O. Patient is continent of bowel and bladder, last BM was 12/04/16. Dysphagia 3 diet, honey thick liquids. Tube feeds 5x per day. Pain relieved with PRN meds. Safety and skin protocol maintained. Bed in lowest position, bed alarm on, call bell within reach, will continue to monitor.     Goal: Discharge Needs Assessment  Outcome: Progressing   11/25/16 0950 11/25/16 1209   OTHER   Equipment Currently Used at Home --  cane, straight;glucometer  (Used a cane occasionally. )   Patient and/or family were provided with choice of facilities / services that are available and appropriate to meet post hospital care needs? --  N/A   Discharge Needs Assessment   Concerns to be Addressed --  adjustment to diagnosis/illness;care coordination/care conferences;discharge planning   Readmission Within the Last 30 Days --  planned readmission  (Acute to [facility] admission)   Transportation Anticipated family or friend will provide --    Anticipated Changes Related to Illness --  inability to care for self   Equipment Needed After Discharge --  other (see comments)  (Possibly oxygen, will try to wean patient. TBD regarding other HME)   Discharge Facility/Level of Care Needs --  other (see comments)  (TBD)     Goal: Interprofessional Rounds/Family Conf  Outcome: Progressing   11/25/16 1209   OTHER   Clinical EDD (Estimated Discharge Date)  (Will team on Tuesday)       Problem: Self-Care Deficit (Adult,Obstetrics,Pediatric)  Goal: Identify Related Risk Factors and Signs and Symptoms  Related risk factors and signs and symptoms are identified upon initiation of Human Response Clinical Practice Guideline (CPG).   Outcome: Progressing   12/02/16 1555   Self-Care Deficit (Adult,Obstetrics,Pediatric)   Related Risk Factors (Self-Care Deficit) activity intolerance;disease process;medication effects;pain/discomfort;surgery   Signs and Symptoms (Self-Care Deficit) decreased balance;gait unstable;weakness, paresis, paralysis     Goal: Improved Ability to Perform BADL and IADL  Patient will demonstrate the desired outcomes by discharge/transition of care.   Outcome: Progressing   12/05/16 1446   Self-Care Deficit (Adult,Obstetrics,Pediatric)   Improved Ability to Perform BADL and IADL making progress toward outcome       Problem: Fall Risk (Adult)  Goal: Identify Related Risk Factors and Signs and Symptoms  Related risk factors and signs and symptoms are identified upon initiation of Human Response Clinical Practice Guideline (CPG).   Outcome: Progressing   12/02/16 1555   Fall Risk (Adult)   Related Risk Factors (Fall Risk) fatigue/slow reaction   Signs and Symptoms (Fall Risk) presence of risk factors     Goal: Absence of Fall  Patient will demonstrate the desired outcomes by discharge/transition of care.   Outcome: Progressing   12/05/16 1446   Fall Risk (Adult)   Absence of Fall making progress toward outcome       Problem: Activity Intolerance (Adult)  Goal: Identify Related Risk Factors and Signs and Symptoms  Related risk factors and signs and symptoms are identified upon initiation of Human Response Clinical Practice Guideline (CPG).   Outcome: Progressing   12/02/16 1555   Activity Intolerance (Adult)   Related Risk Factors (Activity Intolerance) generalized weakness   Signs and Symptoms (Activity Intolerance) nausea;pain/discomfort     Goal: Activity Tolerance  Patient will demonstrate the  desired outcomes by discharge/transition of care.   Outcome: Progressing   12/05/16 1446   Activity Intolerance (Adult)   Activity Tolerance making progress toward outcome     Goal: Effective Energy Conservation Techniques  Patient will demonstrate the desired outcomes by discharge/transition of care.   Outcome: Progressing   12/04/16 1321   Activity Intolerance (Adult) Effective Energy Conservation Techniques making progress toward outcome       Problem: Nutrition, Imbalanced: Inadequate Oral Intake (Adult)  Goal: Identify Related Risk Factors and Signs and Symptoms  Related risk factors and signs and symptoms are identified upon initiation of Human Response Clinical Practice Guideline (CPG).   Outcome: Progressing   12/02/16 1555   Nutrition, Imbalanced: Inadequate Oral Intake (Adult)   Related Risk Factors (Nutrition Imbalance, Inadequate Oral Intake) eating aversion   Signs and Symptoms (Nutrition Imbalance, Inadequate Oral Intake: Signs and Symptoms) nausea and vomiting     Goal: Improved Oral Intake  Patient will demonstrate the desired outcomes by discharge/transition of care.   Outcome: Progressing   12/05/16 1446   Nutrition, Imbalanced: Inadequate Oral Intake (Adult)   Improved Oral Intake making progress toward outcome     Goal: Prevent Further Weight Loss  Patient will demonstrate the desired outcomes by discharge/transition of care.   Outcome: Progressing   12/05/16 1446   Nutrition, Imbalanced: Inadequate Oral Intake (Adult)   Prevent Further Weight Loss making progress toward outcome       Problem: Infection, Risk/Actual (Adult)  Goal: Identify Related Risk Factors and Signs and Symptoms  Related risk factors and signs and symptoms are identified upon initiation of Human Response Clinical Practice Guideline (CPG).   Outcome: Progressing   12/02/16 1555   Infection, Risk/Actual (Adult)   Related Risk Factors (Infection, Risk/Actual) prolonged hospitalization;medication effects;surgery/procedure   Signs and Symptoms (Infection, Risk/Actual) feeding/eating intolerance;vomiting;weakness     Goal: Infection Prevention/Resolution  Patient will demonstrate the desired outcomes by discharge/transition of care.   Outcome: Progressing   12/05/16 1446   Infection, Risk/Actual (Adult)   Infection Prevention/Resolution making progress toward outcome       Problem: Skin Injury Risk (Adult)  Goal: Identify Related Risk Factors and Signs and Symptoms  Related risk factors and signs and symptoms are identified upon initiation of Human Response Clinical Practice Guideline (CPG).   Outcome: Progressing   12/02/16 1555   Skin Injury Risk (Adult)   Related Risk Factors (Skin Injury Risk) cognitive impairment;hospitalization prolonged;mobility impaired     Goal: Skin Health and Integrity  Patient will demonstrate the desired outcomes by discharge/transition of care.   Outcome: Progressing   12/05/16 1446   Skin Injury Risk (Adult)   Skin Health and Integrity making progress toward outcome       Problem: VTE, DVT and PE (Adult)  Goal: Signs and Symptoms of Listed Potential Problems Will be Absent, Minimized or Managed (VTE, DVT and PE)  Signs and symptoms of listed potential problems will be absent, minimized or managed by discharge/transition of care (reference VTE, DVT and PE (Adult) CPG).   Outcome: Progressing   12/04/16 1321   VTE, DVT and PE (Adult)   Problems Assessed (VTE, DVT, PE) all   Problems Present (VTE, DVT, PE) none

## 2016-12-05 NOTE — Unmapped (Signed)
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 Post-op course complicated by decompensation secondary to acute right heart failure, AKI requiring CRRT (resolved), severe pulmonary hypertension responsive to sildenafil, bradycardia/ asystole requiring pacemaker placement, atrial fibrilliation/ narrow complex tachycardia requiring anti-coagulation with warfarin and amiodarone/ digoxin, malnutrition/ debility, and altered mental status/ delirium improved with transition to cyclosporine and psychiatric medications. On 11/03/16 he had a significant spike in his LFTs s/p biopsy 8/24 negative for rejection. He continued to recover and was transferred to floor status 8/28. The patient was transferred back to the SICU 9/9 2/2 GI bleed following PEG placement. Once resuscitated and stabilized, he was transferred back to the floor 9/10. He was evaluated by AIR 9/13 and accepted for admission.     LFTs were elevated on 9/14 and downtrending today. Liver transplant U/S performed 9/14- RIs stable, vessels patent, mild increased dilation of CBD. 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       Plan:     -- Nutritionist to discuss tube feeds with patient today  -- Continue daily CMP, CBC, coags  -- Immunosuppresion per pharmacy  -- MRI abdomen for [facility] recurrence screening will be performed at 6 months     -- Transplant surgery will continue to follow     Subjective/Interval Events:   No acute events overnight. Tolerating a dysphagia 3 diet ~ 25% of meals. Refusing bolused tube feeds as he feels too full.     Objective:        Vital Signs:  BP 140/69  - Pulse 73  - Temp 36.8 ??C (Oral)  - Resp 18  - Ht 172.7 cm (5' 8)  - Wt 69 kg (152 lb 1.9 oz)  - SpO2 95%  - BMI 23.13 kg/m??     Input/Output:  I/O last 3 completed shifts:  In: 844 [NG/GT:844]  Out: 725 [Urine:725]    Physical Exam:  General: No acute distress, lying in bed  Neuro: Awake and interactive  Lungs: Normal work of breathing on room air, decannulation site healing well, voice stronger    Abdomen: Soft, non-tender, non-distended, incision well healed, G tube in place, capped    Lab Results   Component Value Date    WBC 1.2 (L) 11/28/2016    HGB 9.0 (L) 11/28/2016    HCT 26.1 (L) 11/28/2016    PLT 117 (L) 11/28/2016       Lab Results   Component Value Date    NA 136 12/05/2016    K 4.2 12/05/2016    CL 100 12/05/2016    CO2 30.0 12/05/2016    BUN 15 12/05/2016    CREATININE 0.93 12/05/2016    CALCIUM 9.0 12/05/2016    MG 2.0 11/24/2016    PHOS 3.2 11/24/2016     Lab Results   Component Value Date    ALKPHOS 256 (H) 12/05/2016    BILITOT 1.3 (H) 12/05/2016    BILIDIR 1.20 (H) 11/24/2016    PROT 5.1 (L) 12/05/2016    ALBUMIN 2.8 (L) 12/05/2016    ALT 64 12/05/2016    AST 26 12/05/2016    GGT 261 (H) 11/24/2016  Imaging:  No new imaging

## 2016-12-06 LAB — COMPREHENSIVE METABOLIC PANEL
ALBUMIN: 3 g/dL — ABNORMAL LOW (ref 3.5–5.0)
ALKALINE PHOSPHATASE: 237 U/L — ABNORMAL HIGH (ref 38–126)
ALT (SGPT): 54 U/L (ref 19–72)
ANION GAP: 10 mmol/L (ref 9–15)
AST (SGOT): 26 U/L (ref 19–55)
BILIRUBIN TOTAL: 1.3 mg/dL — ABNORMAL HIGH (ref 0.0–1.2)
BLOOD UREA NITROGEN: 15 mg/dL (ref 7–21)
BUN / CREAT RATIO: 15
CALCIUM: 9 mg/dL (ref 8.5–10.2)
CHLORIDE: 100 mmol/L (ref 98–107)
CO2: 27 mmol/L (ref 22.0–30.0)
EGFR MDRD AF AMER: >=60 mL/min/{1.73_m2} (ref >=60)
GLUCOSE RANDOM: 160 mg/dL (ref 65–179)
POTASSIUM: 4.4 mmol/L (ref 3.5–5.0)
PROTEIN TOTAL: 5.3 g/dL — ABNORMAL LOW (ref 6.5–8.3)
SODIUM: 137 mmol/L (ref 135–145)

## 2016-12-06 LAB — PROTIME-INR: INR: 2.19

## 2016-12-06 LAB — CREATININE: Creatinine:MCnc:Pt:Ser/Plas:Qn:: 1.01

## 2016-12-06 LAB — PROTIME: Lab: 25 — ABNORMAL HIGH

## 2016-12-06 LAB — CMV DNA, QUANTITATIVE, PCR

## 2016-12-06 LAB — CMV VIRAL LD: Lab: NOT DETECTED

## 2016-12-06 NOTE — Unmapped (Signed)
Physical Medicine and Rehabilitation  Daily Progress Note [facility]    ASSESSMENT:     ??Jeffrey Ward??is a 66 y.o.??male??admitted to [facility] for physical rehabilitation s/p liver transplant (09/15/16).  ??  Primary Rehab diagnosis: (Debility) 16 Debility (Non-Cardiac/Non-Pulmonary)    PLAN:     REHAB:   - PT and OT to maximize functional status with mobility and ADLs as well as prevention of joint contracture.   - SLP for cognitive and swallow function.  - RT for community re-integration, education, and leisure support services.  - Pharmacy consult for patient and family education on medication management.   - To be discussed in weekly Interdisciplinary Team Conference.  ??  S/p Liver transplant (09/15/16) For chronic hep C and HCC: Liver ultrasound on 11/03/16 showing mild to moderate intrahepatic biliary duct dilation which appeared to be new from prior ultrasound. Liver biopsy on 11/04/16 did not show acute rejection.The patient had significantly worsening LFTs on 11/25/16 And transplant ultrasound was performed showing RIs stable, vessels patent, mild increased dilation of CBD. ERCP was performed by GI at the recommendation of transplant on 11/29/16 and 2 stents were placed. Biopsy was obtained. The patient's LFTs have trended down as of 12/01/16.   - f/u transplant recs  - Continue cyclosporine, CellCept  - continue Valcyte, Bactrim for ppx  - CTM LFTs on daily CMP  ??  GIB: Patient had GI bleed from an ulcer which was clipped on 11/20/16 by gastroenterology.   - Protonix 40 mg BID  ??  Paroxismal A-fib: Patient on digoxin, amiodarone, warfarin for a-fib. Patient recently had GI bleed and warfarin was restarted on 9/11. INR was 1.69 on 11/24/16. Goal INR is 2-3. The patient's INR was 4.71 on 11/30/16 this is likely due to an interaction of the warfarin with Zosyn which was prescribed prophylactically after biopsy. The patient only needed 24 hours of Zosyn. Warfarin was held for 1 dose on 11/30/16 due to the elevated INR. It had come back down to 2.9 the following day and warfarin was restarted.  - Continue amiodarone, digoxin, warfarin  - follow up cardiology as outpatient  ??  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  ??  Right Heart Failure. Stable: Patient reportedly had right heart failure postop after transplant.   ??  Severe PAH: The patient's wife reports that he was told that he had pulmonary hypertension prior to admission to hospital. 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  DM:   - Endocrine following, follow-up recommendations  ??  Delirium: Patient was on Zyprexa 7.5 mg nightly which was held due to prolonged QTc. No longer having issues with delerium  - QTc 412 on 9/19  ??  Dysphonia with dysphagia: The patient has been intubated multiple times throughout his hospitalization. Voice has been hoarse/hypophonic since intubations. 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 They believe that this is possibly secondary to multiple intubations and would like follow-up with him in 3-4 months to evaluate recovery of nerve function and discussed possible injection laryngoplasty  - currently on tube feeds  - Follow-up with ENT in 3-4 months  ??  Nausea/Vomiting: Patient had nausea and vomiting with tube feeds as recently as the morning of 11/24/16. This was thought to be due to high residuals. Tube feed rate was slowed. We'll continue to monitor for symptoms and continue to communicate with dietitian/nutritionist about tube feed recommendations.  -  Scopolamine patch  ??  Distal LE weakness/dysesthesias: On AIR admission, patient with left lower extremity weakness and numbness on the dorsal aspect of the left foot since surgery. He reported that he has developed some numbness to the right great toe. The patient developed a spreading dysesthesias of his legs to the mid tibial area bilaterally the day following admission to rehabilitation. 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 Neurology was consulted and believed that the symptoms are consistent with a radiculopathy. They recommended MRI lumbar spine which showed mild stenosis at L4-L5 and L5-S1. I spoke with neurology on 9/24 who stated that the patient likely has a peripheral neuropathy given MRI results. They state that they would recommend therapy at this time and consideration of EMG. We will plan to order EMG as outpatient as the patient's neuropathy is stable. We are favoring the diagnosis of critical illness neuropathy at this time as he has had a prolonged hospital stay.  - EMG as outpatient    DISPO: Patient to be discussed at weekly interdisciplinary team conference.   - ELOS: 9/29     SUBJECTIVE:     NAEON. The patient denies any nausea or vomiting. He believes to scopolamine patch is helping. He states that his numbness/tingling and weakness is stable from yesterday. He denies CP, SOB, chills.     OBJECTIVE:     Vital signs (last 24 hours):  Temp:  [36.6 ??C] 36.6 ??C  Heart Rate:  [68-70] 68  Resp:  [18] 18  BP: (163-168)/(76-81) 168/76  MAP (mmHg):  [109-116] 115  SpO2:  [98 %] 98 %    Physical Exam:  GEN: NAD, in wheelchair in therapy gym  EYES: EOMI  HENT: Hearing adequate for conversation, soft hoarse voice  RESP: NWOB on RA  CV: RRR  ABD: Nondistended  SKIN: no visible rashes   MSK: no notable contractures  NEURO: follows commands well and answers questions appropriately  PSYCH: mood euthymic, affect appropriate

## 2016-12-06 NOTE — Unmapped (Signed)
Transplant Surgery Progress Note    Hospital Day: 13    Assessment:     Jeffrey Ward??is a 66 y.o.??male??with a history of IDDM, GERD, prior perioperative a-fib with RVR and 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 Post-op course complicated by decompensation secondary to acute right heart failure, AKI requiring CRRT (resolved), severe pulmonary hypertension responsive to sildenafil, bradycardia/ asystole requiring pacemaker placement, atrial fibrilliation/ narrow complex tachycardia requiring anti-coagulation with warfarin and amiodarone/ digoxin, malnutrition/ debility, and altered mental status/ delirium improved with transition to cyclosporine and psychiatric medications. On 11/03/16 he had a significant spike in his LFTs s/p biopsy 8/24 negative for rejection. He continued to recover and was transferred to floor status 8/28. The patient was transferred back to the SICU 9/9 2/2 GI bleed following PEG placement. Once resuscitated and stabilized, he was transferred back to the floor 9/10. He was evaluated by AIR 9/13 and accepted for admission.     LFTs were elevated on 9/14 and downtrending today. Liver transplant U/S performed 9/14- RIs stable, vessels patent, mild increased dilation of CBD. 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       Plan:     -- Continue TFs per nutritionist  -- Continue daily CMP, CBC, coags  -- Immunosuppresion per pharmacy  -- MRI abdomen for [facility] recurrence screening will be performed at 6 months     -- Transplant surgery will continue to follow , please page [phone] with any questions or concerns    Ellin Mayhew, MD  General Surgery, PGY3      Subjective/Interval Events:   No acute events overnight. Tolerating a dysphagia 3 diet ~ 25% of meals, refusing PO supplements. Tolerated TFs overnight without nausea or bloating.    Objective:        Vital Signs:  BP 168/76  - Pulse 68  - Temp 36.6 ??C (Oral)  - Resp 18  - Ht 172.7 cm (5' 8)  - Wt 69 kg (152 lb 1.9 oz)  - SpO2 98%  - BMI 23.13 kg/m??     Input/Output:  I/O last 3 completed shifts:  In: 390 [NG/GT:390]  Out: 525 [Urine:525]    Physical Exam:  General: No acute distress, lying in bed  Neuro: Awake and interactive  Lungs: Normal work of breathing on room air, decannulation site healing well, voice stronger    Abdomen: Soft, non-tender, non-distended, incision well healed, G tube in place, capped    Lab Results   Component Value Date    WBC 1.2 (L) 11/28/2016    HGB 9.0 (L) 11/28/2016    HCT 26.1 (L) 11/28/2016    PLT 117 (L) 11/28/2016       Lab Results   Component Value Date    NA 137 12/06/2016    K 4.4 12/06/2016    CL 100 12/06/2016    CO2 27.0 12/06/2016    BUN 15 12/06/2016    CREATININE 1.01 12/06/2016    CALCIUM 9.0 12/06/2016    MG 2.0 11/24/2016    PHOS 3.2 11/24/2016     Lab Results   Component Value Date    ALKPHOS 237 (H) 12/06/2016    BILITOT 1.3 (H) 12/06/2016    BILIDIR 1.20 (H) 11/24/2016    PROT 5.3 (L) 12/06/2016    ALBUMIN 3.0 (L) 12/06/2016    ALT 54 12/06/2016  AST 26 12/06/2016    GGT 261 (H) 11/24/2016       Imaging:  No new imaging

## 2016-12-06 NOTE — Unmapped (Signed)
Nutrition - Brief:    Met with pt today to discuss EN intolerance, supplement refusal and poor PO intake. He reports bolus feeds make him feel too full but does not believe he would eat more if TFs were held. Discussed modifying EN to fiber-free formula (Osmolite 1.5) in order to improve tolerance; pt amenable, EPIC order modified accordingly.     Reiterated importance of consuming adequate calories/protein and encouraged pt to consume supplements as ordered (currently receiving Magic Cups and Ensure Pudding TID). He refused Magic Cups but remains amenable to ensure pudding.     Encouraged wife/family member/patient to document % intake of meals/snacks on calorie count in order to determine adequacy.    Labs reviewed. Scopolamine patch effective for nausea. Suggest MVI w/ minerals.     Will continue to follow 1-2x weekly (more frequently as indicated)    Jackqulyn Livings MPH, RD, LDN  Pager: [phone]

## 2016-12-06 NOTE — Unmapped (Signed)
Problem: Patient Care Overview  Goal: Plan of Care Review  Outcome: Progressing  Pt. Is Alert and Oriented x3. Currently on a honey thick, mechanical soft diet and tube feeds. Pt is currently tolerating tube feeds as he is incrementally increasing his intake. ACHS accuchecks. Complains of chronic back pain. No other complaints at this time. Will continue to monitor.      12/02/16 1555   OTHER   Plan of Care Reviewed With patient   Plan of Care Review   Progress improving         Goal: Discharge Needs Assessment  Outcome: Progressing   11/25/16 0950 11/25/16 1209   OTHER   Equipment Currently Used at Home --  cane, straight;glucometer  (Used a cane occasionally. )   Patient and/or family were provided with choice of facilities / services that are available and appropriate to meet post hospital care needs? --  N/A   Discharge Needs Assessment   Concerns to be Addressed --  adjustment to diagnosis/illness;care coordination/care conferences;discharge planning   Readmission Within the Last 30 Days --  planned readmission  (Acute to AIR admission)   Transportation Anticipated family or friend will provide --    Anticipated Changes Related to Illness --  inability to care for self   Equipment Needed After Discharge --  other (see comments)  (Possibly oxygen, will try to wean patient. TBD regarding other HME)   Discharge Facility/Level of Care Needs --  other (see comments)  (TBD)     Goal: Interprofessional Rounds/Family Conf  Outcome: Progressing   11/25/16 1209   OTHER   Clinical EDD (Estimated Discharge Date)  (Will team on Tuesday)       Problem: Self-Care Deficit (Adult,Obstetrics,Pediatric)  Goal: Identify Related Risk Factors and Signs and Symptoms  Related risk factors and signs and symptoms are identified upon initiation of Human Response Clinical Practice Guideline (CPG).   Outcome: Progressing   12/02/16 1555   Self-Care Deficit (Adult,Obstetrics,Pediatric)   Related Risk Factors (Self-Care Deficit) activity intolerance;disease process;medication effects;pain/discomfort;surgery   Signs and Symptoms (Self-Care Deficit) decreased balance;gait unstable;weakness, paresis, paralysis     Goal: Improved Ability to Perform BADL and IADL  Patient will demonstrate the desired outcomes by discharge/transition of care.   Outcome: Progressing   12/05/16 1446   Self-Care Deficit (Adult,Obstetrics,Pediatric)   Improved Ability to Perform BADL and IADL making progress toward outcome       Problem: Fall Risk (Adult)  Goal: Identify Related Risk Factors and Signs and Symptoms  Related risk factors and signs and symptoms are identified upon initiation of Human Response Clinical Practice Guideline (CPG).   Outcome: Progressing   12/02/16 1555   Fall Risk (Adult)   Related Risk Factors (Fall Risk) fatigue/slow reaction   Signs and Symptoms (Fall Risk) presence of risk factors     Goal: Absence of Fall  Patient will demonstrate the desired outcomes by discharge/transition of care.   Outcome: Progressing   12/05/16 1446   Fall Risk (Adult)   Absence of Fall making progress toward outcome       Problem: Activity Intolerance (Adult)  Goal: Identify Related Risk Factors and Signs and Symptoms  Related risk factors and signs and symptoms are identified upon initiation of Human Response Clinical Practice Guideline (CPG).   Outcome: Progressing   12/02/16 1555   Activity Intolerance (Adult)   Related Risk Factors (Activity Intolerance) generalized weakness   Signs and Symptoms (Activity Intolerance) nausea;pain/discomfort     Goal: Activity Tolerance  Patient  will demonstrate the desired outcomes by discharge/transition of care.   Outcome: Progressing    Goal: Effective Energy Conservation Techniques  Patient will demonstrate the desired outcomes by discharge/transition of care.   Outcome: Progressing   12/04/16 1321   Activity Intolerance (Adult)   Effective Energy Conservation Techniques making progress toward outcome       Problem: Nutrition, Imbalanced: Inadequate Oral Intake (Adult)  Goal: Identify Related Risk Factors and Signs and Symptoms  Related risk factors and signs and symptoms are identified upon initiation of Human Response Clinical Practice Guideline (CPG).   Outcome: Progressing   12/02/16 1555   Nutrition, Imbalanced: Inadequate Oral Intake (Adult)   Related Risk Factors (Nutrition Imbalance, Inadequate Oral Intake) eating aversion   Signs and Symptoms (Nutrition Imbalance, Inadequate Oral Intake: Signs and Symptoms) nausea and vomiting     Goal: Improved Oral Intake  Patient will demonstrate the desired outcomes by discharge/transition of care.   Outcome: Progressing   12/05/16 1446   Nutrition, Imbalanced: Inadequate Oral Intake (Adult)   Improved Oral Intake making progress toward outcome     Goal: Prevent Further Weight Loss  Patient will demonstrate the desired outcomes by discharge/transition of care.   Outcome: Progressing   12/05/16 1446   Nutrition, Imbalanced: Inadequate Oral Intake (Adult)   Prevent Further Weight Loss making progress toward outcome       Problem: Infection, Risk/Actual (Adult)  Goal: Identify Related Risk Factors and Signs and Symptoms  Related risk factors and signs and symptoms are identified upon initiation of Human Response Clinical Practice Guideline (CPG).   Outcome: Progressing   12/02/16 1555   Infection, Risk/Actual (Adult)   Related Risk Factors (Infection, Risk/Actual) prolonged hospitalization;medication effects;surgery/procedure   Signs and Symptoms (Infection, Risk/Actual) feeding/eating intolerance;vomiting;weakness     Goal: Infection Prevention/Resolution  Patient will demonstrate the desired outcomes by discharge/transition of care.   Outcome: Progressing   12/05/16 1446   Infection, Risk/Actual (Adult)   Infection Prevention/Resolution making progress toward outcome       Problem: Skin Injury Risk (Adult)  Goal: Identify Related Risk Factors and Signs and Symptoms  Related risk factors and signs and symptoms are identified upon initiation of Human Response Clinical Practice Guideline (CPG).   Outcome: Progressing   12/02/16 1555   Skin Injury Risk (Adult)   Related Risk Factors (Skin Injury Risk) cognitive impairment;hospitalization prolonged;mobility impaired     Goal: Skin Health and Integrity  Patient will demonstrate the desired outcomes by discharge/transition of care.   Outcome: Progressing   12/05/16 1446   Skin Injury Risk (Adult)   Skin Health and Integrity making progress toward outcome       Problem: VTE, DVT and PE (Adult)  Goal: Signs and Symptoms of Listed Potential Problems Will be Absent, Minimized or Managed (VTE, DVT and PE)  Signs and symptoms of listed potential problems will be absent, minimized or managed by discharge/transition of care (reference VTE, DVT and PE (Adult) CPG).   Outcome: Progressing   12/04/16 1321   VTE, DVT and PE (Adult)   Problems Assessed (VTE, DVT, PE) all   Problems Present (VTE, DVT, PE) none

## 2016-12-07 DIAGNOSIS — G894 Chronic pain syndrome: Secondary | ICD-10-CM

## 2016-12-07 DIAGNOSIS — M961 Postlaminectomy syndrome, not elsewhere classified: Secondary | ICD-10-CM

## 2016-12-07 DIAGNOSIS — I272 Pulmonary hypertension, unspecified: Secondary | ICD-10-CM

## 2016-12-07 DIAGNOSIS — E1142 Type 2 diabetes mellitus with diabetic polyneuropathy: Secondary | ICD-10-CM

## 2016-12-07 DIAGNOSIS — Z9181 History of falling: Secondary | ICD-10-CM

## 2016-12-07 DIAGNOSIS — Z4823 Encounter for aftercare following liver transplant: Principal | ICD-10-CM

## 2016-12-07 DIAGNOSIS — D649 Anemia, unspecified: Secondary | ICD-10-CM

## 2016-12-07 DIAGNOSIS — Z6821 Body mass index (BMI) 21.0-21.9, adult: Secondary | ICD-10-CM

## 2016-12-07 DIAGNOSIS — Z431 Encounter for attention to gastrostomy: Secondary | ICD-10-CM

## 2016-12-07 DIAGNOSIS — Z8619 Personal history of other infectious and parasitic diseases: Secondary | ICD-10-CM

## 2016-12-07 DIAGNOSIS — N400 Enlarged prostate without lower urinary tract symptoms: Secondary | ICD-10-CM

## 2016-12-07 DIAGNOSIS — E46 Unspecified protein-calorie malnutrition: Secondary | ICD-10-CM

## 2016-12-07 DIAGNOSIS — Z794 Long term (current) use of insulin: Secondary | ICD-10-CM

## 2016-12-07 DIAGNOSIS — Z950 Presence of cardiac pacemaker: Secondary | ICD-10-CM

## 2016-12-07 DIAGNOSIS — I4891 Unspecified atrial fibrillation: Secondary | ICD-10-CM

## 2016-12-07 DIAGNOSIS — Z7982 Long term (current) use of aspirin: Secondary | ICD-10-CM

## 2016-12-07 DIAGNOSIS — Z8505 Personal history of malignant neoplasm of liver: Secondary | ICD-10-CM

## 2016-12-07 DIAGNOSIS — K219 Gastro-esophageal reflux disease without esophagitis: Secondary | ICD-10-CM

## 2016-12-07 LAB — COMPREHENSIVE METABOLIC PANEL
ALBUMIN: 2.8 g/dL — ABNORMAL LOW (ref 3.5–5.0)
ALKALINE PHOSPHATASE: 206 U/L — ABNORMAL HIGH (ref 38–126)
ALT (SGPT): 49 U/L (ref 19–72)
ANION GAP: 6 mmol/L — ABNORMAL LOW (ref 9–15)
AST (SGOT): 25 U/L (ref 19–55)
BILIRUBIN TOTAL: 1.2 mg/dL (ref 0.0–1.2)
BUN / CREAT RATIO: 17
CALCIUM: 8.9 mg/dL (ref 8.5–10.2)
CHLORIDE: 99 mmol/L (ref 98–107)
CO2: 29 mmol/L (ref 22.0–30.0)
CREATININE: 1.02 mg/dL (ref 0.70–1.30)
EGFR MDRD AF AMER: >=60 mL/min/{1.73_m2} (ref >=60)
EGFR MDRD NON AF AMER: >=60 mL/min/{1.73_m2} (ref >=60)
GLUCOSE RANDOM: 177 mg/dL (ref 65–179)
PROTEIN TOTAL: 5 g/dL — ABNORMAL LOW (ref 6.5–8.3)
SODIUM: 134 mmol/L — ABNORMAL LOW (ref 135–145)

## 2016-12-07 LAB — ANION GAP: Anion gap 3:SCnc:Pt:Ser/Plas:Qn:: 6 — ABNORMAL LOW

## 2016-12-07 LAB — CYCLOSPORINE (FPIA) BLOOD: Lab: 202

## 2016-12-07 LAB — PROTIME-INR: PROTIME: 24.8 s — ABNORMAL HIGH (ref 10.2–12.8)

## 2016-12-07 LAB — INR: Lab: 2.18

## 2016-12-07 NOTE — Unmapped (Signed)
Education Follow up Assessment (Patient may utilize resources: family, booklet, med list.)   Re-educate on all incorrect responses and reassess those at conclusion of session.     Discussed below with both [name], wife and patient    1) Who do you call on nights, weekends, and holidays if you are having a transplant issue? On call number-reinforced the on call liver coordinator number     2) Name 2 reasons to call the on-call coordinator. Not feeling well    3) When do you take your morning medicines on lab draw days? After labs     4) Can you name one your antirejection medicines? yes-cyclosporine    5) Can you name one your antiinfectious medicines? Did not assess    6) What are you responsible for monitoring when you are at home? Blood pressure, temp, weight     7) Have you met with a dietician?  yes and wife and pt would like to meet with again prior to discharge. Have paged dietician Alannah    8) Has your diet changed from before transplant?  yes-PEG tube and soft diet    9) Have you met with a pharmacist?  yes but wife would like to meet with her again. Spoke with Judeth Cornfield, pharmacist    10) Do you feel comfortable going home?  Pt reported feeling nervous because he has been in the hospital for ~3 months. Reinforced to patient it is okay to feel that way and that there is always someone to call if need anything or questions/concerns. Wife reported she knows who to call but is very grateful pt is going home on Saturday.    Caryl Ada Inpatient Abdominal Transplant Nurse Coordinator 12/07/2016 2:00 PM

## 2016-12-07 NOTE — Unmapped (Signed)
Problem: Patient Care Overview  Goal: Plan of Care Review  Outcome: Progressing   12/06/16 1812   OTHER   Plan of Care Reviewed With patient   Plan of Care Review   Progress improving   AA&O x3. Set up for meals, feeds self.Continent of bowel and bladder. Last bm this evening. Has chronic lowback pain, prn tramadol and lidocaine patch given with some relief provided. Getting bolus tube feedings, ordered for 5 times/day but patient sometimes c/o fullness and refuses to take feeding. DVT,PE, falls and pressure ulcer prevention protocols observed.Safety precautions maintained with bed in low position, wheels locked and call bell in reach. Will continue to monitor.    Problem: Self-Care Deficit (Adult,Obstetrics,Pediatric)  Goal: Improved Ability to Perform BADL and IADL  Patient will demonstrate the desired outcomes by discharge/transition of care.   Outcome: Progressing   12/06/16 1812   Self-Care Deficit (Adult,Obstetrics,Pediatric)   Improved Ability to Perform BADL and IADL making progress toward outcome       Problem: Fall Risk (Adult)  Goal: Absence of Fall  Patient will demonstrate the desired outcomes by discharge/transition of care.   Outcome: Progressing   12/06/16 1812   Fall Risk (Adult)   Absence of Fall making progress toward outcome       Problem: Activity Intolerance (Adult)  Goal: Activity Tolerance  Patient will demonstrate the desired outcomes by discharge/transition of care.   Outcome: Progressing   12/06/16 1812   Activity Intolerance (Adult)   Activity Tolerance making progress toward outcome       Problem: Nutrition, Imbalanced: Inadequate Oral Intake (Adult)  Goal: Improved Oral Intake  Patient will demonstrate the desired outcomes by discharge/transition of care.   Outcome: Progressing   12/06/16 1812   Nutrition, Imbalanced: Inadequate Oral Intake (Adult)   Improved Oral Intake making progress toward outcome       Problem: Skin Injury Risk (Adult)  Goal: Skin Health and Integrity  Patient will demonstrate the desired outcomes by discharge/transition of care.   Outcome: Progressing   12/06/16 1812   Skin Injury Risk (Adult)   Skin Health and Integrity making progress toward outcome       Problem: VTE, DVT and PE (Adult)  Goal: Signs and Symptoms of Listed Potential Problems Will be Absent, Minimized or Managed (VTE, DVT and PE)  Signs and symptoms of listed potential problems will be absent, minimized or managed by discharge/transition of care (reference VTE, DVT and PE (Adult) CPG).   Outcome: Progressing   12/06/16 1812   VTE, DVT and PE (Adult)   Problems Assessed (VTE, DVT, PE) all   Problems Present (VTE, DVT, PE) none

## 2016-12-07 NOTE — Unmapped (Signed)
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 Post-op course complicated by decompensation secondary to acute right heart failure, AKI requiring CRRT (resolved), severe pulmonary hypertension responsive to sildenafil, bradycardia/ asystole requiring pacemaker placement, atrial fibrilliation/ narrow complex tachycardia requiring anti-coagulation with warfarin and amiodarone/ digoxin, malnutrition/ debility, and altered mental status/ delirium improved with transition to cyclosporine and psychiatric medications. On 11/03/16 he had a significant spike in his LFTs s/p biopsy 8/24 negative for rejection. He continued to recover and was transferred to floor status 8/28. The patient was transferred back to the SICU 9/9 2/2 GI bleed following PEG placement. Once resuscitated and stabilized, he was transferred back to the floor 9/10. He was evaluated by [facility] 9/13 and accepted for admission.     LFTs were elevated on 9/14.  Liver transplant U/S performed 9/14- RIs stable, vessels patent, mild increased dilation of CBD. 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       Plan:     -- Immunosuppresion per pharmacy  -- MRI abdomen for [facility] recurrence screening will be performed at 6 months    -- Transplant surgery will continue to follow until discharge    Subjective/Interval Events:   No acute events overnight. Tolerating a dysphagia 3 diet and tube feeds.     Objective:        Vital Signs:  BP 161/80  - Pulse 74  - Temp 36.6 ??C (Oral)  - Resp 18  - Ht 172.7 cm (5' 8)  - Wt 69 kg (152 lb 1.9 oz)  - SpO2 98%  - BMI 23.13 kg/m??     Input/Output:  I/O last 3 completed shifts:  In: 280 [NG/GT:280]  Out: 551 [Urine:550; Stool:1]    Physical Exam:  General: No acute distress, lying in bed  Neuro: Awake and interactive  Lungs: Normal work of breathing on room air, decannulation site healing well, voice stronger    Abdomen: Soft, non-tender, non-distended, incision well healed, G tube in place, capped    Lab Results   Component Value Date    WBC 1.2 (L) 11/28/2016    HGB 9.0 (L) 11/28/2016    HCT 26.1 (L) 11/28/2016    PLT 117 (L) 11/28/2016       Lab Results   Component Value Date    NA 134 (L) 12/07/2016    K 4.7 12/07/2016    CL 99 12/07/2016    CO2 29.0 12/07/2016    BUN 17 12/07/2016    CREATININE 1.02 12/07/2016    CALCIUM 8.9 12/07/2016    MG 2.0 11/24/2016    PHOS 3.2 11/24/2016     Lab Results   Component Value Date    ALKPHOS 206 (H) 12/07/2016    BILITOT 1.2 12/07/2016    BILIDIR 1.20 (H) 11/24/2016    PROT 5.0 (L) 12/07/2016    ALBUMIN 2.8 (L) 12/07/2016    ALT 49 12/07/2016    AST 25 12/07/2016    GGT 261 (H) 11/24/2016       Imaging:  No new imaging

## 2016-12-07 NOTE — Unmapped (Signed)
Physical Medicine and Rehabilitation  Daily Progress Note [facility]    ASSESSMENT:     ??Jeffrey Ward??is a 66 y.o.??male??admitted to [facility] for physical rehabilitation s/p liver transplant (09/15/16).  ??  Primary Rehab diagnosis: (Debility) 16 Debility (Non-Cardiac/Non-Pulmonary)    PLAN:     REHAB:   - PT and OT to maximize functional status with mobility and ADLs as well as prevention of joint contracture.   - SLP for cognitive and swallow function.  - RT for community re-integration, education, and leisure support services.  - Pharmacy consult for patient and family education on medication management.   - To be discussed in weekly Interdisciplinary Team Conference.  ??  S/p Liver transplant (09/15/16) For chronic hep C and HCC: Liver ultrasound on 11/03/16 showing mild to moderate intrahepatic biliary duct dilation which appeared to be new from prior ultrasound. Liver biopsy on 11/04/16 did not show acute rejection.The patient had significantly worsening LFTs on 11/25/16 And transplant ultrasound was performed showing RIs stable, vessels patent, mild increased dilation of CBD. ERCP was performed by GI at the recommendation of transplant on 11/29/16 and 2 stents were placed. Biopsy was obtained. The patient's LFTs have trended down as of 12/01/16.   - f/u transplant recs  - Continue cyclosporine, CellCept  - continue Valcyte, Bactrim for ppx  - CTM LFTs on daily CMP  ??  GIB: Patient had GI bleed from an ulcer which was clipped on 11/20/16 by gastroenterology.   - Protonix 40 mg BID  ??  Paroxismal A-fib: Patient on digoxin, amiodarone, warfarin for a-fib. Patient recently had GI bleed and warfarin was restarted on 9/11. INR was 1.69 on 11/24/16. Goal INR is 2-3. The patient's INR was 4.71 on 11/30/16 this is likely due to an interaction of the warfarin with Zosyn which was prescribed prophylactically after biopsy. The patient only needed 24 hours of Zosyn. Warfarin was held for 1 dose on 11/30/16 due to the elevated INR. It had come back down to 2.9 the following day and warfarin was restarted.  - Continue amiodarone, digoxin, warfarin  - follow up cardiology as outpatient  ??  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  ??  Right Heart Failure. Stable: Patient reportedly had right heart failure postop after transplant.   ??  Severe PAH: The patient's wife reports that he was told that he had pulmonary hypertension prior to admission to hospital. 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  DM:   - Endocrine following, follow-up recommendations  ??  Delirium: Patient was on Zyprexa 7.5 mg nightly which was held due to prolonged QTc. No longer having issues with delerium  - QTc 412 on 9/19  ??  Dysphonia with dysphagia: The patient has been intubated multiple times throughout his hospitalization. Voice has been hoarse/hypophonic since intubations. 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 They believe that this is possibly secondary to multiple intubations and would like follow-up with him in 3-4 months to evaluate recovery of nerve function and discussed possible injection laryngoplasty  - currently on tube feeds  - Follow-up with ENT in 3-4 months  ??  Nausea/Vomiting: Patient had nausea and vomiting with tube feeds as recently as the morning of 11/24/16. This was thought to be due to high residuals. Tube feed rate was slowed. We'll continue to monitor for symptoms and continue to communicate with dietitian/nutritionist about tube feed recommendations.  -  Scopolamine patch  ??  Distal LE weakness/dysesthesias: On AIR admission, patient with left lower extremity weakness and numbness on the dorsal aspect of the left foot since surgery. He reported that he has developed some numbness to the right great toe. The patient developed a spreading dysesthesias of his legs to the mid tibial area bilaterally the day following admission to rehabilitation. 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 Neurology was consulted and believed that the symptoms are consistent with a radiculopathy. They recommended MRI lumbar spine which showed mild stenosis at L4-L5 and L5-S1. I spoke with neurology on 9/24 who stated that the patient likely has a peripheral neuropathy given MRI results. They state that they would recommend therapy at this time and consideration of EMG. We will plan to order EMG as outpatient as the patient's neuropathy is stable. We are favoring the diagnosis of critical illness neuropathy at this time as he has had a prolonged hospital stay.  - EMG as outpatient    DISPO: Patient to be discussed at weekly interdisciplinary team conference.   - ELOS: 9/29     SUBJECTIVE:     NAEON. The patient continues to deny nausea or vomiting since the scopolamine patch was put on. He denies chills, chest pain, shortness of breath, any new symptoms today. He states that his numbness/tingling and weakness has not gotten any better or worse since yesterday.    OBJECTIVE:     Vital signs (last 24 hours):  Temp:  [36.6 ??C-36.8 ??C] 36.6 ??C  Heart Rate:  [65-74] 74  Resp:  [18] 18  BP: (160-175)/(75-82) 161/80  SpO2:  [98 %-99 %] 98 %    Physical Exam:  GEN: NAD, Sitting in bed comfortably  EYES: EOMI  HENT: Hearing adequate for conversation, soft hoarse voice  RESP: NWOB on RA  CV: RRR  ABD: Nondistended  SKIN: no visible rashes   MSK: no notable contractures  NEURO: follows commands well and answers questions appropriately  PSYCH: mood euthymic, affect appropriate

## 2016-12-07 NOTE — Unmapped (Signed)
Problem: Patient Care Overview  Goal: Plan of Care Review  Outcome: Progressing  Pt is alert and oriented x4. Mechanical soft, thin liquid diet and tube feeds. Last tube feed intake was 210 cc. Amount will be advanced with next tube feeding to his goal of 237cc. Pt complains of chronic back pain, being managed with tramadol q4h PRN. LBM 12/06/16. Fall, Safety and pressure injury prevention protocols being maintained. Will continue to monitor.      12/06/16 1812   OTHER   Plan of Care Reviewed With patient   Plan of Care Review   Progress improving     Goal: Discharge Needs Assessment  Outcome: Progressing   11/25/16 0950 11/25/16 1209   OTHER   Equipment Currently Used at Home --  cane, straight;glucometer  (Used a cane occasionally. )   Patient and/or family were provided with choice of facilities / services that are available and appropriate to meet post hospital care needs? --  N/A   Discharge Needs Assessment   Concerns to be Addressed --  adjustment to diagnosis/illness;care coordination/care conferences;discharge planning   Readmission Within the Last 30 Days --  planned readmission  (Acute to AIR admission)   Transportation Anticipated family or friend will provide --    Anticipated Changes Related to Illness --  inability to care for self   Equipment Needed After Discharge --  other (see comments)  (Possibly oxygen, will try to wean patient. TBD regarding other HME)   Discharge Facility/Level of Care Needs --  other (see comments)  (TBD)     Goal: Interprofessional Rounds/Family Conf  Outcome: Progressing   11/25/16 1209   OTHER   Clinical EDD (Estimated Discharge Date)  (Will team on Tuesday)       Problem: Self-Care Deficit (Adult,Obstetrics,Pediatric)  Goal: Identify Related Risk Factors and Signs and Symptoms  Related risk factors and signs and symptoms are identified upon initiation of Human Response Clinical Practice Guideline (CPG).   Outcome: Progressing   12/02/16 1555   Self-Care Deficit (Adult,Obstetrics,Pediatric)   Related Risk Factors (Self-Care Deficit) activity intolerance;disease process;medication effects;pain/discomfort;surgery   Signs and Symptoms (Self-Care Deficit) decreased balance;gait unstable;weakness, paresis, paralysis     Goal: Improved Ability to Perform BADL and IADL  Patient will demonstrate the desired outcomes by discharge/transition of care.   Outcome: Progressing   12/06/16 1812   Self-Care Deficit (Adult,Obstetrics,Pediatric)   Improved Ability to Perform BADL and IADL making progress toward outcome       Problem: Fall Risk (Adult)  Goal: Identify Related Risk Factors and Signs and Symptoms  Related risk factors and signs and symptoms are identified upon initiation of Human Response Clinical Practice Guideline (CPG).    12/02/16 1555   Fall Risk (Adult)   Related Risk Factors (Fall Risk) fatigue/slow reaction   Signs and Symptoms (Fall Risk) presence of risk factors     Goal: Absence of Fall  Patient will demonstrate the desired outcomes by discharge/transition of care.    12/06/16 1812   Fall Risk (Adult)   Absence of Fall making progress toward outcome       Problem: Activity Intolerance (Adult)  Goal: Identify Related Risk Factors and Signs and Symptoms  Related risk factors and signs and symptoms are identified upon initiation of Human Response Clinical Practice Guideline (CPG).    12/02/16 1555   Activity Intolerance (Adult)   Related Risk Factors (Activity Intolerance) generalized weakness   Signs and Symptoms (Activity Intolerance) nausea;pain/discomfort     Goal: Activity Tolerance  Patient will demonstrate the desired outcomes by discharge/transition of care.    12/06/16 1812   Activity Intolerance (Adult)   Activity Tolerance making progress toward outcome     Goal: Effective Energy Conservation Techniques  Patient will demonstrate the desired outcomes by discharge/transition of care.    12/04/16 1321   Activity Intolerance (Adult)   Effective Energy Conservation Techniques making progress toward outcome       Problem: Nutrition, Imbalanced: Inadequate Oral Intake (Adult)  Goal: Identify Related Risk Factors and Signs and Symptoms  Related risk factors and signs and symptoms are identified upon initiation of Human Response Clinical Practice Guideline (CPG).   Outcome: Progressing   12/02/16 1555   Nutrition, Imbalanced: Inadequate Oral Intake (Adult)   Related Risk Factors (Nutrition Imbalance, Inadequate Oral Intake) eating aversion   Signs and Symptoms (Nutrition Imbalance, Inadequate Oral Intake: Signs and Symptoms) nausea and vomiting     Goal: Improved Oral Intake  Patient will demonstrate the desired outcomes by discharge/transition of care.    12/06/16 1812   Nutrition, Imbalanced: Inadequate Oral Intake (Adult)   Improved Oral Intake making progress toward outcome     Goal: Prevent Further Weight Loss  Patient will demonstrate the desired outcomes by discharge/transition of care.    12/05/16 1446   Nutrition, Imbalanced: Inadequate Oral Intake (Adult)   Prevent Further Weight Loss making progress toward outcome       Problem: Infection, Risk/Actual (Adult)  Goal: Identify Related Risk Factors and Signs and Symptoms  Related risk factors and signs and symptoms are identified upon initiation of Human Response Clinical Practice Guideline (CPG).    12/02/16 1555   Infection, Risk/Actual (Adult)   Related Risk Factors (Infection, Risk/Actual) prolonged hospitalization;medication effects;surgery/procedure   Signs and Symptoms (Infection, Risk/Actual) feeding/eating intolerance;vomiting;weakness     Goal: Infection Prevention/Resolution  Patient will demonstrate the desired outcomes by discharge/transition of care.    12/05/16 1446   Infection, Risk/Actual (Adult)   Infection Prevention/Resolution making progress toward outcome       Problem: Skin Injury Risk (Adult)  Goal: Identify Related Risk Factors and Signs and Symptoms  Related risk factors and signs and symptoms are identified upon initiation of Human Response Clinical Practice Guideline (CPG).    12/02/16 1555   Skin Injury Risk (Adult)   Related Risk Factors (Skin Injury Risk) cognitive impairment;hospitalization prolonged;mobility impaired     Goal: Skin Health and Integrity  Patient will demonstrate the desired outcomes by discharge/transition of care.    12/06/16 1812   Skin Injury Risk (Adult)   Skin Health and Integrity making progress toward outcome       Problem: VTE, DVT and PE (Adult)  Goal: Signs and Symptoms of Listed Potential Problems Will be Absent, Minimized or Managed (VTE, DVT and PE)  Signs and symptoms of listed potential problems will be absent, minimized or managed by discharge/transition of care (reference VTE, DVT and PE (Adult) CPG).   Outcome: Progressing   12/06/16 1812   VTE, DVT and PE (Adult)   Problems Assessed (VTE, DVT, PE) all   Problems Present (VTE, DVT, PE) none      12/06/16 1812   VTE, DVT and PE (Adult)   Problems Assessed (VTE, DVT, PE) all   Problems Present (VTE, DVT, PE) none

## 2016-12-07 NOTE — Unmapped (Signed)
Problem: Patient Care Overview  Goal: Plan of Care Review  Outcome: Progressing   12/07/16 1125   OTHER   Plan of Care Reviewed With patient   Plan of Care Review   Progress improving       Problem: Self-Care Deficit (Adult,Obstetrics,Pediatric)  Goal: Identify Related Risk Factors and Signs and Symptoms  Related risk factors and signs and symptoms are identified upon initiation of Human Response Clinical Practice Guideline (CPG).   Outcome: Progressing   12/07/16 1125   Self-Care Deficit (Adult,Obstetrics,Pediatric)   Related Risk Factors (Self-Care Deficit) activity intolerance;disease process;fatigue/weakness;immobility;medication effects;pain/discomfort;surgery   Signs and Symptoms (Self-Care Deficit) decreased balance;gait unstable;weakness, paresis, paralysis       Problem: Fall Risk (Adult)  Goal: Identify Related Risk Factors and Signs and Symptoms  Related risk factors and signs and symptoms are identified upon initiation of Human Response Clinical Practice Guideline (CPG).   Outcome: Progressing   12/07/16 1125   Fall Risk (Adult)   Related Risk Factors (Fall Risk) fatigue/slow reaction;gait/mobility problems;polypharmacy;objects hard to reach;environment unfamiliar   Signs and Symptoms (Fall Risk) presence of risk factors       Problem: Activity Intolerance (Adult)  Goal: Identify Related Risk Factors and Signs and Symptoms  Related risk factors and signs and symptoms are identified upon initiation of Human Response Clinical Practice Guideline (CPG).   Outcome: Progressing   12/07/16 1125   Activity Intolerance (Adult)   Related Risk Factors (Activity Intolerance) generalized weakness;pain   Signs and Symptoms (Activity Intolerance) pain/discomfort;nausea       Problem: Nutrition, Imbalanced: Inadequate Oral Intake (Adult)  Goal: Identify Related Risk Factors and Signs and Symptoms  Related risk factors and signs and symptoms are identified upon initiation of Human Response Clinical Practice Guideline (CPG).   Outcome: Progressing   12/07/16 1125   Nutrition, Imbalanced: Inadequate Oral Intake (Adult)   Related Risk Factors (Nutrition Imbalance, Inadequate Oral Intake) appetite decreased;eating aversion   Signs and Symptoms (Nutrition Imbalance, Inadequate Oral Intake: Signs and Symptoms) weakness/lethargy;nausea and vomiting       Problem: Infection, Risk/Actual (Adult)  Goal: Identify Related Risk Factors and Signs and Symptoms  Related risk factors and signs and symptoms are identified upon initiation of Human Response Clinical Practice Guideline (CPG).   Outcome: Progressing   12/07/16 1125   Infection, Risk/Actual (Adult)   Related Risk Factors (Infection, Risk/Actual) prolonged hospitalization;medication effects;malnutrition;surgery/procedure   Signs and Symptoms (Infection, Risk/Actual) feeding/eating intolerance;weakness;pain       Problem: Skin Injury Risk (Adult)  Goal: Identify Related Risk Factors and Signs and Symptoms  Related risk factors and signs and symptoms are identified upon initiation of Human Response Clinical Practice Guideline (CPG).   Outcome: Progressing   12/07/16 1125   Skin Injury Risk (Adult)   Related Risk Factors (Skin Injury Risk) hospitalization prolonged;medical devices;medication;mobility impaired;nutritional deficiencies       Problem: VTE, DVT and PE (Adult)  Goal: Signs and Symptoms of Listed Potential Problems Will be Absent, Minimized or Managed (VTE, DVT and PE)  Signs and symptoms of listed potential problems will be absent, minimized or managed by discharge/transition of care (reference VTE, DVT and PE (Adult) CPG).   Outcome: Progressing   12/07/16 1125   VTE, DVT and PE (Adult)   Problems Assessed (VTE, DVT, PE) all   Problems Present (VTE, DVT, PE) none

## 2016-12-08 LAB — CBC W/ AUTO DIFF
BASOPHILS ABSOLUTE COUNT: 0 10*9/L (ref 0.0–0.1)
EOSINOPHILS ABSOLUTE COUNT: 0 10*9/L (ref 0.0–0.4)
HEMATOCRIT: 29.8 % — ABNORMAL LOW (ref 41.0–53.0)
HEMOGLOBIN: 9.6 g/dL — ABNORMAL LOW (ref 13.5–17.5)
LARGE UNSTAINED CELLS: 2 % (ref 0–4)
LYMPHOCYTES ABSOLUTE COUNT: 0.4 10*9/L — ABNORMAL LOW (ref 1.5–5.0)
MEAN CORPUSCULAR HEMOGLOBIN CONC: 32.2 g/dL (ref 31.0–37.0)
MEAN CORPUSCULAR VOLUME: 94.7 fL (ref 80.0–100.0)
MEAN PLATELET VOLUME: 8.1 fL (ref 7.0–10.0)
MONOCYTES ABSOLUTE COUNT: 0.1 10*9/L — ABNORMAL LOW (ref 0.2–0.8)
NEUTROPHILS ABSOLUTE COUNT: 1.4 10*9/L — ABNORMAL LOW (ref 2.0–7.5)
PLATELET COUNT: 161 10*9/L (ref 150–440)
RED BLOOD CELL COUNT: 3.15 10*12/L — ABNORMAL LOW (ref 4.50–5.90)
RED CELL DISTRIBUTION WIDTH: 20.6 % — ABNORMAL HIGH (ref 12.0–15.0)
WBC ADJUSTED: 2 10*9/L — ABNORMAL LOW (ref 4.5–11.0)

## 2016-12-08 LAB — COMPREHENSIVE METABOLIC PANEL
ALBUMIN: 3 g/dL — ABNORMAL LOW (ref 3.5–5.0)
ALKALINE PHOSPHATASE: 206 U/L — ABNORMAL HIGH (ref 38–126)
ALT (SGPT): 44 U/L (ref 19–72)
AST (SGOT): 20 U/L (ref 19–55)
BILIRUBIN TOTAL: 1.4 mg/dL — ABNORMAL HIGH (ref 0.0–1.2)
BLOOD UREA NITROGEN: 17 mg/dL (ref 7–21)
BUN / CREAT RATIO: 16
CALCIUM: 9.3 mg/dL (ref 8.5–10.2)
CHLORIDE: 98 mmol/L (ref 98–107)
CO2: 27 mmol/L (ref 22.0–30.0)
CREATININE: 1.06 mg/dL (ref 0.70–1.30)
EGFR MDRD AF AMER: >=60 mL/min/{1.73_m2} (ref >=60)
EGFR MDRD NON AF AMER: >=60 mL/min/{1.73_m2} (ref >=60)
GLUCOSE RANDOM: 162 mg/dL (ref 65–179)
POTASSIUM: 4.6 mmol/L (ref 3.5–5.0)
SODIUM: 134 mmol/L — ABNORMAL LOW (ref 135–145)

## 2016-12-08 LAB — SMEAR REVIEW

## 2016-12-08 LAB — INR: Lab: 2.28

## 2016-12-08 LAB — PHOSPHORUS: Phosphate:MCnc:Pt:Ser/Plas:Qn:: 3.3

## 2016-12-08 LAB — MAGNESIUM: Magnesium:MCnc:Pt:Ser/Plas:Qn:: 1.3 — ABNORMAL LOW

## 2016-12-08 LAB — HYPOCHROMIA

## 2016-12-08 LAB — AST (SGOT): Aspartate aminotransferase:CCnc:Pt:Ser/Plas:Qn:: 20

## 2016-12-08 MED ORDER — SILDENAFIL (PULMONARY HYPERTENSION) 10 MG/ML ORAL SUSPENSION
Freq: Three times a day (TID) | GASTROSTOMY | 0 refills | 0.00000 days | Status: CP
Start: 2016-12-08 — End: 2016-12-08

## 2016-12-08 MED ORDER — SILDENAFIL (PULMONARY HYPERTENSION) 10 MG/ML ORAL SUSPENSION: 30 mg | mL | Freq: Three times a day (TID) | 0 refills | 0 days | Status: AC

## 2016-12-08 MED ORDER — ASPIRIN 81 MG TABLET,DELAYED RELEASE
ORAL_TABLET | Freq: Every day | ORAL | 11 refills | 0 days | Status: CP
Start: 2016-12-08 — End: 2016-12-09

## 2016-12-08 MED ORDER — CYCLOSPORINE MODIFIED 100 MG/ML ORAL SOLUTION: 225 mg | mL | Freq: Two times a day (BID) | 11 refills | 0 days | Status: AC

## 2016-12-08 MED ORDER — THIAMINE HCL (VITAMIN B1) 100 MG TABLET
ORAL_TABLET | Freq: Every day | ORAL | 11 refills | 0 days | Status: CP
Start: 2016-12-08 — End: 2016-12-09

## 2016-12-08 MED ORDER — CHOLECALCIFEROL (VITAMIN D3) 25 MCG (1,000 UNIT) TABLET
ORAL_TABLET | 11 refills | 0 days
Start: 2016-12-08 — End: 2017-11-10

## 2016-12-08 MED ORDER — DIGOXIN 125 MCG (0.125 MG) TABLET: 63 ug | tablet | Freq: Every day | 11 refills | 0 days | Status: AC

## 2016-12-08 MED ORDER — DIGOXIN 125 MCG (0.125 MG) TABLET
ORAL_TABLET | Freq: Every day | ORAL | 10 refills | 0.00000 days | Status: CP
Start: 2016-12-08 — End: 2016-12-08

## 2016-12-08 MED ORDER — CHOLECALCIFEROL (VITAMIN D3) 50 MCG (2,000 UNIT) CAPSULE
ORAL_CAPSULE | Freq: Every day | ORAL | 11 refills | 0 days | Status: CP
Start: 2016-12-08 — End: 2016-12-09

## 2016-12-08 MED ORDER — VALGANCICLOVIR 50 MG/ML ORAL SOLUTION
Freq: Every day | GASTROSTOMY | 2 refills | 0.00000 days | Status: CP
Start: 2016-12-08 — End: 2016-12-26

## 2016-12-08 MED ORDER — OMEPRAZOLE 20 MG CAPSULE,DELAYED RELEASE
ORAL_CAPSULE | Freq: Every day | ORAL | 0 refills | 0 days | Status: CP
Start: 2016-12-08 — End: 2016-12-09

## 2016-12-08 MED ORDER — POLYETHYLENE GLYCOL 3350 17 GRAM ORAL POWDER PACKET
Freq: Every day | ORAL | 0 refills | 0.00000 days | Status: CP | PRN
Start: 2016-12-08 — End: 2016-12-08

## 2016-12-08 MED ORDER — DOCUSATE SODIUM 50 MG/5 ML ORAL LIQUID
Freq: Two times a day (BID) | GASTROSTOMY | 0 refills | 0.00000 days | Status: CP | PRN
Start: 2016-12-08 — End: 2016-12-09

## 2016-12-08 MED ORDER — MYCOPHENOLATE MOFETIL 200 MG/ML ORAL SUSPENSION: 500 mg | mL | Freq: Two times a day (BID) | 12 refills | 0 days | Status: AC

## 2016-12-08 MED ORDER — WARFARIN 3 MG TABLET: 3 mg | tablet | Freq: Every day | 11 refills | 0 days | Status: AC

## 2016-12-08 MED ORDER — CYCLOSPORINE MODIFIED 100 MG/ML ORAL SOLUTION
Freq: Two times a day (BID) | GASTROSTOMY | 11 refills | 0.00000 days | Status: CP
Start: 2016-12-08 — End: 2016-12-26

## 2016-12-08 MED ORDER — MYCOPHENOLATE MOFETIL 200 MG/ML ORAL SUSPENSION
Freq: Two times a day (BID) | GASTROSTOMY | 11 refills | 0.00000 days | Status: CP
Start: 2016-12-08 — End: 2016-12-08

## 2016-12-08 MED ORDER — INSULIN U-100 REGULAR HUMAN 100 UNIT/ML INJECTION SOLUTION
Freq: Three times a day (TID) | SUBCUTANEOUS | 0 refills | 0.00000 days | Status: CP
Start: 2016-12-08 — End: 2016-12-09

## 2016-12-08 MED ORDER — INSULIN U-100 REGULAR HUMAN 100 UNIT/ML INJECTION SOLUTION: 4 [IU] | mL | Freq: Three times a day (TID) | 0 refills | 0 days | Status: AC

## 2016-12-08 MED ORDER — TRAMADOL 50 MG TABLET
ORAL_TABLET | Freq: Four times a day (QID) | ORAL | 0 refills | 0 days | Status: CP | PRN
Start: 2016-12-08 — End: 2016-12-09

## 2016-12-08 MED ORDER — INSULIN NPH ISOPHANE U-100 HUMAN 100 UNIT/ML SUBCUTANEOUS SUSPENSION
Freq: Two times a day (BID) | SUBCUTANEOUS | 0 refills | 0.00000 days | Status: CP
Start: 2016-12-08 — End: 2016-12-09

## 2016-12-08 MED ORDER — VALGANCICLOVIR 50 MG/ML ORAL SOLUTION: 450 mg | mL | Freq: Every day | 2 refills | 0 days | Status: AC

## 2016-12-08 MED ORDER — MAGNESIUM OXIDE 400 MG (241.3 MG MAGNESIUM) TABLET
ORAL_TABLET | Freq: Two times a day (BID) | ORAL | 11 refills | 0.00000 days | Status: CP
Start: 2016-12-08 — End: 2016-12-09

## 2016-12-08 MED ORDER — MELATONIN 3 MG TABLET: tablet | Freq: Every evening | 0 refills | 0 days | Status: AC

## 2016-12-08 MED ORDER — MELATONIN 3 MG TABLET
Freq: Every evening | ORAL | 0 refills | 0.00000 days | Status: CP
Start: 2016-12-08 — End: 2016-12-08

## 2016-12-08 MED ORDER — WARFARIN 3 MG TABLET
ORAL_TABLET | Freq: Every day | ORAL | 11 refills | 0.00000 days | Status: CP
Start: 2016-12-08 — End: 2016-12-09

## 2016-12-08 MED ORDER — MAGNESIUM OXIDE 400 MG (241.3 MG MAGNESIUM) TABLET: 400 mg | tablet | Freq: Two times a day (BID) | 11 refills | 0 days | Status: AC

## 2016-12-08 MED ORDER — POLYETHYLENE GLYCOL 3350 17 GRAM ORAL POWDER PACKET: g | 0 refills | 0 days

## 2016-12-08 MED FILL — GNP VITAMIN B-1 (EACH)/100MG/TABS: GNP VITAMIN B-1 (EACH)/100MG/TABS | 100 days supply | Qty: 100 | Fill #0

## 2016-12-08 MED FILL — POLYETHYLENE GLYCOL/3350/PK: POLYETHYLENE GLYCOL/3350/PK | 24 days supply | Qty: 24 | Fill #0

## 2016-12-08 MED FILL — MYCOPHENOLATE MOFETIL/200MG/ML/SUSR: MYCOPHENOLATE MOFETIL/200MG/ML/SUSR | 30 days supply | Qty: 1 | Fill #0

## 2016-12-08 MED FILL — DIGOXIN/0.125MG/TABS: DIGOXIN/0.125MG/TABS | 30 days supply | Qty: 15 | Fill #0

## 2016-12-08 MED FILL — MAGNESIUM OXIDE/400MG/TABS: MAGNESIUM OXIDE/400MG/TABS | 60 days supply | Qty: 120 | Fill #0

## 2016-12-08 MED FILL — VITAMIN D-3/1000 U/: VITAMIN D-3/1000 U/ | 50 days supply | Qty: 100 | Fill #0

## 2016-12-08 MED FILL — MELATONIN/3MG/TABS: MELATONIN/3MG/TABS | 50 days supply | Qty: 100 | Fill #0

## 2016-12-08 MED FILL — WARFARIN/3MG/TAB: WARFARIN/3MG/TAB | 30 days supply | Qty: 30 | Fill #0

## 2016-12-08 NOTE — Unmapped (Signed)
Tues., Oct. 9, 2018         3:45pm                                                                            [address]  PCP f/u with [name], MD                                                                                  [place], [place] [address]  [facility]                                                                          [phone]

## 2016-12-08 NOTE — Unmapped (Signed)
Notified by [facility] COP PA is needed for sildenafil oral suspension. Phoned [facility] [employer] and approval granted with conf # [id number]. Notified Weyman Croon, MTS in rehab  Caryl Ada Inpatient Transplant Nurse Cordinator 12/08/2016 4:48 PM

## 2016-12-08 NOTE — Unmapped (Signed)
Problem: Patient Care Overview  Goal: Plan of Care Review  Outcome: Progressing  Pt is A&O: x3, no c/o pain, breathing regular & unlabored, chest expanded symmetrically, pt is on honey thick diet, meals with set up, pt is on accu check: Q6hrs, pt is on TF nutritional bolus: jevity 1.5cal 5xday(0600, 1000, 1400, 1800, 2200) via pump pre/post flush with water 50 ml, continent of bladder & bowel, transfers: SBA, bed mobility: independent, pt is on aspiration/falls/protective precautions, bed is in low position, call bell is within reach, will continue monitoring pt's conditions  Goal: Individualization and Mutuality  Outcome: Progressing    Goal: Discharge Needs Assessment  Outcome: Progressing    Goal: Interprofessional Rounds/Family Conf  Outcome: Progressing      Problem: Self-Care Deficit (Adult,Obstetrics,Pediatric)  Goal: Identify Related Risk Factors and Signs and Symptoms  Related risk factors and signs and symptoms are identified upon initiation of Human Response Clinical Practice Guideline (CPG).   Outcome: Progressing    Goal: Improved Ability to Perform BADL and IADL  Patient will demonstrate the desired outcomes by discharge/transition of care.   Outcome: Progressing      Problem: Fall Risk (Adult)  Goal: Identify Related Risk Factors and Signs and Symptoms  Related risk factors and signs and symptoms are identified upon initiation of Human Response Clinical Practice Guideline (CPG).   Outcome: Progressing    Goal: Absence of Fall  Patient will demonstrate the desired outcomes by discharge/transition of care.   Outcome: Progressing      Problem: Activity Intolerance (Adult)  Goal: Identify Related Risk Factors and Signs and Symptoms  Related risk factors and signs and symptoms are identified upon initiation of Human Response Clinical Practice Guideline (CPG).   Outcome: Progressing    Goal: Activity Tolerance  Patient will demonstrate the desired outcomes by discharge/transition of care.   Outcome: Progressing    Goal: Effective Energy Conservation Techniques  Patient will demonstrate the desired outcomes by discharge/transition of care.   Outcome: Progressing      Problem: Nutrition, Imbalanced: Inadequate Oral Intake (Adult)  Goal: Identify Related Risk Factors and Signs and Symptoms  Related risk factors and signs and symptoms are identified upon initiation of Human Response Clinical Practice Guideline (CPG).   Outcome: Progressing    Goal: Improved Oral Intake  Patient will demonstrate the desired outcomes by discharge/transition of care.   Outcome: Progressing    Goal: Prevent Further Weight Loss  Patient will demonstrate the desired outcomes by discharge/transition of care.   Outcome: Progressing      Problem: Infection, Risk/Actual (Adult)  Goal: Identify Related Risk Factors and Signs and Symptoms  Related risk factors and signs and symptoms are identified upon initiation of Human Response Clinical Practice Guideline (CPG).   Outcome: Progressing    Goal: Infection Prevention/Resolution  Patient will demonstrate the desired outcomes by discharge/transition of care.   Outcome: Progressing      Problem: Skin Injury Risk (Adult)  Goal: Identify Related Risk Factors and Signs and Symptoms  Related risk factors and signs and symptoms are identified upon initiation of Human Response Clinical Practice Guideline (CPG).   Outcome: Progressing    Goal: Skin Health and Integrity  Patient will demonstrate the desired outcomes by discharge/transition of care.   Outcome: Progressing      Problem: VTE, DVT and PE (Adult)  Goal: Signs and Symptoms of Listed Potential Problems Will be Absent, Minimized or Managed (VTE, DVT and PE)  Signs and symptoms of listed potential problems will be  absent, minimized or managed by discharge/transition of care (reference VTE, DVT and PE (Adult) CPG).   Outcome: Progressing

## 2016-12-08 NOTE — Unmapped (Signed)
Physical Medicine and Rehab  Discharge Summary [facility]    Patient Name: Jeffrey Ward       Medical Record Number: [id number]   Date of Birth: 08-13-1950  Sex: Male          Room/Bed: 7306/7306-01  Payor Info: Payor: MEDICARE / Plan: MEDICARE PART A AND PART B / Product Type: *No Product type* /      Admit Date: 11/24/2016  Discharge Date: 12-10-16    Admitting Physician: Merrily Brittle, DO  Attending Provider: Merrily Brittle, DO   Discharge Physician: Juanetta Beets, MD    Admitting Diagnosis: (Debility) 16 Debility (Non-Cardiac/Non-Pulmonary)  Discharge Diagnoses: (Debility) 16 Debility (Non-Cardiac/Non-Pulmonary)  Secondary Diagnoses:   Active Problems:    Anemia    Chronic pain disorder    Atrial fibrillation with RVR (CMS-HCC)    S/P liver transplant (CMS-HCC)    Pulmonary hypertension, moderate to severe (CMS-HCC)    Acute right heart failure (CMS-HCC)    Mixed level of activity delirium due to multiple etiologies, resolved    Insomnia    Peripheral neuropathy  Resolved Problems:    * No resolved hospital problems. *    Admission Functional Status: Discharge Functional Status:   Activities of Daily Living:   Bathing: set up & supervision in sitting; min assist if standing to wash perineal/buttocks-simulated task  Grooming: set up in sitting  Dressing: UB=set up; LB=min assist. ??Pt able to complete figure four position w/ RLE to doff/donn sock, unable with LLE 2/2 c/o increased back pain therefore completed via forward flexion, no c/o abdominal pain. ??Pt required min assist for dynamic standing balance when simulating pulling clothing up/down.  Eating: total assist-tube feeds  Toileting: min assist for dynamic standing balance during clothing management  IADLs: NT  ??  Mobility:??  Bed Mobility: Supine to sit with min A for log rolling ??  Transfers:Sit<>stand with CGA using RW   Balance:??MIN A dynamic standing balance  Gait:Ambulated x 50 ft with min A for steadying and min A for control of walker along with min VCs due to decreased safety awareness and fast pace   ??  Cognition, Swallow, Speech: AOx4, clear speech, following commands, NPO; enteral nutrition   ??  Patient's Judgement Adequate to Safely Complete Daily Activities: Yes  Patient's Memory Adequate to Safely Complete Daily Activities: Yes  Patient Able to Express Needs/Desires: Yes  Patient has speech problem: No  ??  Assistive Devices: None             Activities of daily living: Min A for transfers and LE dressing, as well as Supervision for UE dressing, bathing and grooming.  Therapist recommending d/c home with a TTB, BSC, and HH OT.   Mobility: able to ambulate short distances with rollator and RW CGA-minA, negotiate 6 stairs with minA, and requires frequent VCs for sequencing of transfers. Patient continues to have decreased endurance, causing him to fatigue quickly and increase his risk of falls. Despite education patient continues to refuse therapist recommendation for a w/c and RW, and despite education has opted for a rollator. Pt has decreased safety with rollator when fatigued requiring CGA-minA with anterior trunk lean, LOB while pivoting and walking backward for transfers, and requires CGA-min A to turn and sit on rollator when fatigued. Patient has decreased safety awareness, impaired balance, and increased anterior trunk lean during ambulation, stair negotiation, and transfers especially when fatigued. Pt and family have decrease insight into impairments and high falls risk. Pt will benefit from  HHPT to continue to address endurance and mobility deficits to return to PLOF.   Cognition, speech, and swallow: A&O x3, soft speech but speech is clear, fully upright with PO, small bites/sips, slow rate, chin tuck with liquids, periodic throat clear/re-swallow, multiple swallows per bolus, enteral nutrition   Assistive devices: Rollator        Indication for Admission / HPI:     Brief hospital course below quoted from Dr. Louie Boston note on 11/24/16  ??  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 Post-op course complicated by decompensation thought to be secondary to acute right heart failure and AKI requiring CRRT (resolved). 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 Severe PAH diagnosed. Eventually had episodes of a. Fib/tachyarrythmia x3, converted with amiodarone, then with digoxin; Cardiology following closely. On 11/03/16 he had a significant spike in his LFTs suspicious for acute rejection; however liver biopsy from 11/04/16 did not show any evidence of rejection.   ??  Underwent G tube on 11/17/2016. ??Hep gtt restarted for bridging to coumadin. Developed GIB on 9/9 and underwent intubation for EGD. Small ulcer at GEJ was injected with epinephrine and clipped.    Hospital Course:     Jeffrey Ward is a 66 y.o. y/o male with PMHx as noted below that presented to [facility] for debility. Hospital course progressed as below listed by problem:    S/p Liver transplant (09/15/16) For chronic hep C and HCC: Liver ultrasound on 11/03/16 showing mild to moderate intrahepatic biliary duct dilation which appeared to be new from prior ultrasound. Liver biopsy on 11/04/16 did not show acute rejection.The patient had significantly worsening LFTs on 11/25/16 And transplant ultrasound was performed showing RIs stable, vessels patent, mild increased dilation of CBD. ERCP was performed by GI at the recommendation of transplant on 11/29/16 and 2 stents were placed. Biopsy was obtained. The patient's LFTs have trended down as of 12/01/16.   ??  GIB: Patient had GI bleed from an ulcer which was clipped on 11/20/16 by gastroenterology. The patient was started on Protonix 40 mg BID.  ??  Paroxismal A-fib: Patient on digoxin,??amiodarone, warfarin for a-fib. Patient recently had GI bleed prior to AIR stay and warfarin was restarted on 9/11. INR was 1.69 on 11/24/16. The patient's INR was 4.71 on 11/30/16 likely due to an interaction of warfarin with Zosyn which was prescribed prophylactically after ERCP. The patient only needed 24 hours of Zosyn. Warfarin was held for 1 dose on 11/30/16 due to the elevated INR. It had come back down to 2.9 the following day and warfarin was restarted. Cardiology was paged prior to discharge to set up postdischarge follow-up.  ??  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  ??  Right Heart Failure. Stable:??Patient reportedly had right heart failure postop after transplant.   ??  Severe PAH:??The patient's wife reports that he??was told that he had??pulmonary hypertension prior to admission to hospital. 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  DM: Endocrine followed during stay. Insulin at discharge was NPH 6u BID with 4 units of regular insulin 3 times a day with meals and sliding scale insulin. The patient will follow-up with PCP for further diabetic management  ??     Dysphonia with dysphagia: The patient has been intubated multiple times throughout his hospitalization. Voice was hoarse/hypophonic  since intubations. 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 They believe that this is possibly secondary to multiple intubations and would like follow-up with him in 3-4 months to evaluate recovery of nerve function and discussed possible injection laryngoplasty  ??  Nausea/Vomiting: Patient had nausea and vomiting with tube feeds as recently as the morning of 11/24/16. This was thought to be due to high residuals. Tube feed rate was slowed and Scopolamine patch was ordered. The patient didn't have any nausea/vomiting after this.  ??  Distal LE weakness/dysesthesias:??On AIR admission, patient with left lower extremity weakness and numbness on the dorsal aspect of the left foot since surgery. He reported that he has developed some numbness to the right great toe. The patient developed a spreading dysesthesias of his legs to the mid tibial area bilaterally the day following admission to rehabilitation. 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 Neurology was consulted and believed that the symptoms are consistent with a radiculopathy. They recommended MRI lumbar spine which showed mild stenosis at L4-L5 and L5-S1. I spoke with neurology on 9/24 who stated that the patient likely has a peripheral neuropathy given MRI results. They state that they would recommend therapy at this time and consideration of EMG. We will plan to order EMG as outpatient as the patient's neuropathy is stable. We are favoring the diagnosis of critical illness neuropathy at this time as he has had a prolonged hospital stay.    Discharge Medications:      Your Medication List      STOP taking these medications    acetaminophen 650 mg/20.3 mL Soln  Commonly known as:  TYLENOL  Replaced by:  acetaminophen 160 mg/5 mL solution     albuterol 2.5 mg/0.5 mL nebulizer solution     insulin NPH 100 unit/mL injection  Commonly known as:  HumuLIN,NovoLIN     insulin regular 100 unit/mL injection  Commonly known as:  HumuLIN,NovoLIN     pantoprazole 40 mg Grps  Commonly known as:  PROTONIX     sildenafil (antihypertensive) 10 mg/mL Susr oral suspension  Commonly known as:  REVATIO  Replaced by:  sildenafil (antihypertensive) 20 mg tablet        START taking these medications    acetaminophen 160 mg/5 mL solution  Commonly known as:  TYLENOL  Take 10.2 mL (325 mg total) by mouth every four (4) hours as needed for pain.  Replaces:  acetaminophen 650 mg/20.3 mL Soln     aspirin 81 MG chewable tablet  1 tablet (81 mg total) by G-tube route daily.     blood sugar diagnostic Strp  Commonly known as:  FREESTYLE TEST  by Other route Four (4) times a day (before meals and nightly).     blood-glucose meter kit  Commonly known as:  glucose monitoring kit  Use as instructed     cholecalciferol (vitamin D3) 2,000 unit Cap  1 capsule (2,000 Units total) by PEG Tube route daily.     insulin ASPART 100 unit/mL injection pen  Commonly known as:  NovoLOG Flexpen U-100 Insulin  Inject 0.04 mL (4 Units total) under the skin Three (3) times a day before meals.     insulin ASPART 100 unit/mL injection pen  Commonly known as:  NovoLOG Flexpen U-100 Insulin  Inject 0-0.12  mL (0-12 Units total) under the skin Four (4) times a day (before meals and nightly). Sliding scale.     insulin glargine 100 unit/mL (3 mL) injection pen  Commonly known as:  LANTUS  Inject 0.12 mL (12 Units total) under the skin nightly.     lancets Misc  1 each by Miscellaneous route Four (4) times a day (before meals and nightly).     magnesium oxide 400 mg (241.3 mg magnesium) tablet  Commonly known as:  MAG-OX  1 tablet (400 mg total) by G-tube route Two (2) times a day. HOLD until directed to start by your coordinator.     omeprazole 20 MG capsule  Commonly known as:  PriLOSEC  Take 1 capsule (20 mg total) by mouth daily. Crush and administer via PEG tube.     pen needle, diabetic 32 gauge x 5/32 Ndle  Commonly known as:  ULTICARE PEN NEEDLE  1 each by Miscellaneous route Four (4) times a day (before meals and nightly).     sildenafil (antihypertensive) 20 mg tablet  Commonly known as:  REVATIO  1.5 tablets (30 mg total) by G-tube route Three (3) times a day. Dissolve 1 1/2 tablets in water and administer via PEG tube.  Replaces:  sildenafil (antihypertensive) 10 mg/mL Susr oral suspension        CHANGE how you take these medications    cycloSPORINE 100 mg/mL microemulsion solution  Commonly known as:  NEORAL  2.3 mL (230 mg total) by G-tube route Two (2) times a day.  What changed: how much to take     digoxin 125 mcg tablet  Commonly known as:  LANOXIN  0.5 tablets (62.5 mcg total) by G-tube route daily.  What changed:  ?? medication strength  ?? how much to take     docusate 50 mg/5 mL liquid  Commonly known as:  COLACE  10 mL (100 mg total) by G-tube route two (2) times a day as needed.  What changed:  ?? when to take this  ?? reasons to take this     melatonin 3 mg Tab  1-2 tablets (3-6 mg total) by PEG Tube route nightly.  What changed:  how much to take     polyethylene glycol 17 gram packet  Commonly known as:  MIRALAX  17 g by G-tube route daily as needed.  What changed:  when to take this        CONTINUE taking these medications    amiodarone 200 MG tablet  Commonly known as:  PACERONE  1 tablet (200 mg total) by G-tube route daily.     mycophenolate 200 mg/mL suspension  Commonly known as:  CELLCEPT  2.5 mL (500 mg total) by G-tube route Two (2) times a day (8am and 8pm).     sulfamethoxazole-trimethoprim 200-40 mg/5 mL suspension  Commonly known as:  BACTRIM,SEPTRA  10 mL by G-tube route 3 (three) times a week.     thiamine 100 MG tablet  Commonly known as:  B-1  1 tablet (100 mg total) by G-tube route daily.     traMADol 50 mg tablet  Commonly known as:  ULTRAM  1 tablet (50 mg total) by G-tube route every six (6) hours as needed. for up to 5 days     valGANciclovir 50 mg/mL Solr  Commonly known as:  VALCYTE  9 mL (450 mg total) by PEG Tube route daily.     warfarin 3 MG tablet  Commonly known as:  COUMADIN  1 tablet (3 mg total) by G-tube route daily with evening meal.          Significant Diagnostic Studies: Reviewed in Epic    Discharge Instructions:     Medications:  Please take all medications as prescribed below and note any changes.    Other Instructions and Information:   Follow Up instructions and Outpatient Referrals     Ambulatory referral to Home Health       Performing location?:  [facility]    Disciplines requested:   Nursing  Physical Therapy  Occupational Therapy  Speech Language Pathology       Services to provide:   Evaluate and Treat  Strengthening Excercises  Assess and Teach  Other: (please note in comments)       Physician to follow patient's care:  PCP    Requested start of care date:  2-3 Days after Discharge    PT/OT/SLP: Evaluate and treat; MBSS follow up    RN: Medication reconciliation and education.   Laboratory Blood Draw:  Draw twice weekly PRIOR to 10am:    Cyclosporine level  Comprehensive panel  Phosphorus  Direct bili  Magnesium  CBC with diff  INR/PT    Results faxed to: [phone]  Provider: Guadalupe Maple is post liver coordinator phone [phone]         Discharge instructions       Activity: Do not lift > 10-15 lbs for 1st 6 weeks, then gradually increase to 25 lbs over the following 6 weeks. Resume heavy lifting only after being cleared to do so at follow-up appointment in Transplant Surgery clinic.    Diet: Regular diet    Your Post-Transplant Coordinator is [employer] Contact your transplant coordinator or the Transplant Surgery Office [phone]) during business hours or page the transplant coordinator on call [phone]) after business hours for:    - fever >100.5 degrees F by mouth, any fever with shaking chills, or other signs or symptoms of infection   - uncontrolled nausea, vomiting, or diarrhea; inability to have a bowel movement for > 3 days.   - any problem that prevents taking medications as scheduled.   - pain uncontrolled with prescribed medication or new pain or tenderness at the surgical site   - sudden weight gain or increase in blood pressure (greater than 140/85)   - shortness of breath, chest pain / discomfort   - new or increasing jaundice   - urinary symptoms including pain / difficulty / burning or tea-colored urine   - any other new or concerning symptoms   - questions regarding your medications or continuing care      Patient may shower, but should not immerse wounds in bath or pool for 2-3 weeks. Wash the surgical site with mild soap and water, but do not scrub vigorously.    You may dress wounds with dry gauze and paper tape to avoid soilage.     Do not drive or operate heavy machinery prior to MD clearance, or at any time while taking narcotics.    Inspect surgical sites at least twice daily, contact Transplant Coordinator for spreading redness, purulent discharge, or increasing bleeding or drainage, or for separation of wounds.     Maintain a written record of daily vital signs, per Handbook instructions.     Maintain a written record of medications taken and review against the discharge medications sheet (orange paper). Periodically review your Transplant Handbook for important information regarding postoperative care  and required precautions.      Labs and Other Follow-ups after Discharge:   Liver  Labs 3x week: Ca, Mag, Phos, Na, K, CI, CO2, BUN, Creatinine, Glucose, CBC, Cyclosporine level, AST, ALT, GGT, AP & T. Bilirubin    Coordinator:  Mariea Stable phone [phone] fax [phone]    Please make sure to follow-up for EMG to help with the diagnosis for your numbness/tingling/weakness in your legs. If you do not hear from them for an appointment please call [phone] for appointment    Please make sure to follow-up with ENT in 3-4 months regarding your voice/swallowing ability. They should call you for an appointment. Do not hear from them please call them at [phone] or [phone] for an appointment. The first number would be the Hospital clinic and the second number is their outpatient clinic in [place].    Please follow-up with physical medicine and rehabilitation in 4-6 weeks. They should call you for an appointment, but if you do not hear from them please call them at [phone] for an appointment.    Please follow-up with cardiology. They should call you for an appointment, but if you do not hear from them please call them at [phone] for an appointment. Follow-Up Appointments:   Please make all follow-up appointments as noted below. If not already scheduled, please set-up an appointment with a Primary Care Provider for continued general medical care.    Appointments which have been scheduled for you    Dec 22, 2016  8:30 AM EDT  LAB ONLY with SURTRA LAB ONLY  [facility] TRANSPLANT SURGERY [place] [facility]) [address]  [place] [address]  [phone]   Dec 22, 2016  9:00 AM EDT  SOCIAL WORK with Coralee Rud, MSW  [facility] LIVER TRANSPLANT [facility]) [address]  [place] [address]  [phone]   Dec 22, 2016 10:30 AM EDT  RETURN NUTRITION with Norm Salt, RD/LDN  [facility] NUTRITION SERVICES TRANSPLANT [place] [facility] REGION) [address]  [place] [address]  [phone]   Dec 22, 2016 11:20 AM EDT  RETURN PHARMD with Ruth-Ann Maryln Manuel, CPP  [facility] TRANSPLANT SURGERY [place]) [address]  [place] [address]  [phone]   Dec 22, 2016  1:15 PM EDT  RETURN 15 with Doyce Loose, MD  [facility] TRANSPLANT SURGERY [facility]) [address]  [place] [address]  [phone]   Dec 26, 2016  1:30 PM EDT  ELECTROMYOGRAPHY with EMG [facility])  [place] [facility] EEG EMG [place] [facility] REGION) [address]  [place] [address]  [phone]   Jan 06, 2017 12:30 PM EDT  NEW PFT 30 with [place] PFT 4  [facility] [place] [facility] PULMONARY SPECIALTY FUNCT [place] [facility] REGION) [address]  [address]  [place] [address]  [phone]   Jan 06, 2017  1:30 PM EDT  NEW  PULM HYPERTENSION with Jettie Booze, MD  [facility] PULMONARY SPECIALTY CL [place] [facility] REGION) [address]  [address]  [place] [address]  [phone]   Jan 10, 2017  9:20 AM EDT  RETURN  GENERAL with Idelle Crouch, MD  [facility] PHYSICAL MEDICINE [facility] [place] [facility] REGION) [address]  [place] [address]  [phone]   Jan 23, 2017 12:00 AM EST  AT HOME PACEMAKER CHECK with [facility] EP REMOTE MONITORING  [facility] EP REMOTE MONITORING [facility] REGION) [address]  2nd Floor [place] [address]  [phone]   Apr 24, 2017 12:00 AM EST  AT HOME PACEMAKER CHECK with [facility] EP REMOTE MONITORING  [facility] EP REMOTE MONITORING [place] [facility] REGION) [address]  [address] [place] [address]  [phone]   Jul 24, 2017 12:00 AM EDT  AT HOME PACEMAKER CHECK with [facility] EP REMOTE MONITORING  [facility] EP REMOTE MONITORING [facility] REGION) [address]  [address] Floor [place] [address]  [phone]    Additional instructions:      Tues., Oct. 9, 2018         3:45pm                                                                            [address]  PCP f/u with [name], MD                                                                                  [place], [place] [address]  [facility]                                                                          [phone]                    Discharge Day Services:  The patient was seen and examined on the day of discharge. Vitals signs and exam are stable. Therapy goals met. Discharge medications and instructions were discussed with the patient and family, and all questions were answered.    Time spent for discharge: 30 minutes or greater    Physical Exam:  Vitals:    12/10/16 0600   BP: 171/75   Pulse: 63   Resp: 18   Temp: 37.3 ??C   SpO2: 96%            GEN: NAD, sitting up in wheelchair  EYES: EOMI  HENT: Hearing adequate for conversation, soft hoarse voice  RESP: CTAB. NWOB on room air     CV: RRR.   ABD: Non-distended. G-tube is in place.   SKIN: no visible rashes   MSK: Moving all 4 extremities at least antigravity  NEURO: Awake, Alert, and Oriented x 3. follows commands well and answers questions appropriately  PSYCH: mood euthymic, affect appropriate??????  Discharge Condition: Stable    Discharge Disposition: home with family assistance/supervision    Home Health: Nursing, Physical Therapy, Occupational Therapy and Speech Therapy  Outpatient Therapy: None     Johnnette Litter, DO PGY-2  [facility] Physical Medicine and Rehabilitation

## 2016-12-08 NOTE — Unmapped (Signed)
Speech Language Pathology Objective Swallow Study (MBS-FEES)  (P) Repeat MBS (12/08/16 1100)         Patient Name:  Jeffrey Ward       Medical Record Number: [id number]   Date of Birth: 10/31/1950  Sex: Male            SLP Treatment Diagnosis:  (P) communication impairment, dysphagia  Reason for Referral: (P) Repeat MBS    Assessment   Patient presented today with oropharyngeal dysphagia that was characterized by slow oral preparatory skills, swallow initiation at the base of tongue for thin liquids, penetration with large sip of thin liquids, residue following the swallow, and retrograde movement of the bolus.  Patient benefited from the following aspiration precautions:  small/single bites/sips, alternating liquids and solids, use of a chin tuck, use of effortful swallow, and use of supraglottic swallow maneuver.  Patient would benefit from ongoing dysphagia therapy.  SLP to continue to follow per current POC.     Recommendations:    PO Recommendations : (P) Regular Solids (no restrictions);Thin liquids      Recommended Compensatory Techniques : (P) Slow rate;Small sips/bites;Monitor signs of aspiration;Endurance may be limited: give small meals and snacks;Cough after every 2-3 boluses and re-swallow;Double swallow;Alternate liquids and solids;Chin tuck;Supraglottic swallow      Follow-up Referral Recommendations : (P) Skilled SLP services to treat dysphagia and address aspiration risk factors      Discharge Recommendations:        (P) Patient will need dysphagia therapy and voice therapy upon discharge from AIR    Prognosis:    (P) Patient's positive prognostic indicators include progress to date, patient motivation to eat and drink by mouth     Plan of Care     SLP Follow-up / Frequency: (P) 1-2x per day-ST will utilize individual and group tx sessions as needed to target improved cognitive-linguistic function and social communication, (P) 3-5x week Planned Treatment Duration : (P) AIR stay pending ongoing progress toward goals    Goals:  Short Term Goal 1: (P) Patient will tolerate 10/10 teaspoon sized water trials without demonstrating s/sx of aspiration across 2 consecutive sessions.    Short Term Goal 2: (P) Patient will eat a dysphagia 3 diet with HTL without demonstrating s/sx of aspiration across 2 consecutive sessions.   Short Term Goal 3: (P) Patient will participate in pharyngeal strengthening exercises independently or with min cues in 80% of trials across 2 consecutive sessions.    Short Term Goal 4: (P) Pt. will participate in pharyngeal strengthening exercises x10.          Patient and Family Goals: (P) To talk again     Subjective  Current Functional Status: (P) 66 y.o. 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 He is now s/p liver transplantation on 09/15/16. Post-op course complicated by decompensation thought to be secondary to acute right heart failure and AKI requiring CRRT. Severe PAH diagnosed. New diagnosis of severe portopulmonary hypertension established 7/9. Respiratory distress resulting in tracheostomy placed 7/19. Now decannulated and PEG in place given h/o severe pharyngeal dysphagia  Patient/Caregiver Reports: (P) Patient reported that he has not been attempting HTL 2/2 texture aversion.  Equipment/Environment: (P) Gastric tube;Wheelchair  Services patient receives: (P) OT;PT;SLP  Communication Preference: (P) Verbal     Patient has no known allergies.   Current Facility-Administered Medications   Medication Dose Route Frequency  Provider Last Rate Last Dose   ??? albuterol 2.5 mg /3 mL (0.083 %) nebulizer solution 2.5 mg  2.5 mg Nebulization Q6H PRN Delane Ginger, DO   2.5 mg at 11/25/16 1116   ??? amiodarone (PACERONE) tablet 200 mg  200 mg PEG Tube Daily Clarisse Gouge Sainburg, DO   200 mg at 12/08/16 0920   ??? cycloSPORINE (NEORAL) microemulsion solution 225 mg  225 mg PEG Tube BID Bufford Lope, MD   225 mg at 12/08/16 0920   ??? dextrose 50 % in water (D50W) solution 12.5 g  12.5 g Intravenous Q10 Min PRN Murvin Donning, MD       ??? digoxin (LANOXIN) tablet 62.5 mcg  62.5 mcg PEG Tube Daily Clarisse Gouge Sainburg, DO   62.5 mcg at 12/08/16 0916   ??? docusate (COLACE) 50 mg/5 mL liquid 100 mg  100 mg G-tube BID Delane Ginger, DO   100 mg at 12/07/16 2107   ??? insulin NPH (HumuLIN,NovoLIN) injection 6 Units  6 Units Subcutaneous BID Murvin Donning, MD   6 Units at 12/08/16 1610   ??? insulin regular (HumuLIN,NovoLIN) injection 0-12 Units  0-12 Units Subcutaneous [facility] April Mirian [place], [place]   Stopped at 12/07/16 0701   ??? insulin regular (HumuLIN,NovoLIN) injection 4 Units  4 Units Subcutaneous 3xd Meals April Lynne Goley, [place]   4 Units at 12/08/16 1312   ??? lidocaine (LIDODERM) 5 % patch 1 patch  1 patch Transdermal Daily Daniel A Sainburg, DO   1 patch at 12/08/16 0919   ??? melatonin tablet 6 mg  6 mg PEG Tube Nightly Daniel A Sainburg, DO   6 mg at 12/07/16 2109   ??? mycophenolate (CELLCEPT) 200 mg/mL suspension 500 mg  500 mg G-tube BID Trellis Paganini [place], [place]   500 mg at 12/08/16 0920   ??? ocular lubricant (GENTEAL SEVERE) 0.3 % ophthalmic gel 1 drop  1 drop Both Eyes TID PRN Delane Ginger, DO       ??? ondansetron (ZOFRAN-ODT) disintegrating tablet 4 mg  4 mg Oral Q8H PRN Murvin Donning, MD   4 mg at 12/04/16 0845   ??? pantoprazole (PROTONIX) granules 40 mg  40 mg G-tube BID Delane Ginger, DO   40 mg at 12/08/16 9604   ??? polyethylene glycol (MIRALAX) packet 17 g  17 g G-tube BID Clarisse Gouge Sain[place], DO   17 g at 11/26/16 0949   ??? scopolamine (TRANSDERM-SCOP) 1 mg over 3 days topical patch 1.5 mg  1 patch Topical Q72H Daniel A Sainburg, DO   1.5 mg at 12/07/16 1306   ??? sildenafil (antihypertensive) (REVATIO) 10 mg/mL oral suspension 30 mg  30 mg PEG Tube [facility] Daniel A Sainburg, DO   30 mg at 12/08/16 1312   ??? sulfamethoxazole-trimethoprim (BACTRIM,SEPTRA) 200-40 mg/5 mL suspension 10 mL  10 mL PEG Tube Once per day on Mon Wed Fri Clarisse Gouge [place]ain[place], DO   10 mL at 12/07/16 0840   ??? thiamine (B-1) tablet 100 mg  100 mg PEG Tube Daily Clarisse Gouge [place]ain[place], DO   100 mg at 12/08/16 5409   ??? traMADol (ULTRAM) tablet 50 mg  50 mg PEG Tube Q4H PRN Delane Ginger, DO   50 mg at 12/07/16 1304   ??? traZODone (DESYREL) tablet 25 mg  25 mg Oral Nightly PRN Delane Ginger, DO       ??? valGANciclovir (VALCYTE) 50 mg/mL oral solution 450 mg  450  mg PEG Tube Daily Clarisse Gouge Sainburg, DO   450 mg at 12/08/16 9562   ??? warfarin (COUMADIN) tablet 3 mg  3 mg Oral Daily Daniel A Sainburg, DO   3 mg at 12/07/16 1806     Patient Active Problem List    Diagnosis Date Noted   ??? Insomnia 11/29/2016   ??? Mixed level of activity delirium due to multiple etiologies, resolved 11/01/2016   ??? Pulmonary hypertension, moderate to severe (CMS-HCC) 10/03/2016   ??? Acute right heart failure (CMS-HCC) 10/03/2016   ??? 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 S/P liver transplant (CMS-HCC) 09/15/2016   ??? Hepatic encephalopathy (CMS-HCC) 07/27/2016   ??? End-stage liver disease (CMS-HCC) 02/09/2016   ??? HCC (hepatocellular carcinoma) (CMS-HCC) 02/09/2016   ??? Right ventricular dilation 01/29/2016   ??? Paroxysmal atrial fibrillation (CMS-HCC) 01/28/2016   ??? Atrial fibrillation with RVR (CMS-HCC) 01/27/2016   ??? Secondary esophageal varices (CMS-HCC) 01/27/2016   ??? Anemia 12/22/2015   ??? Benign prostatic hyperplasia 12/22/2015   ??? History of gastroesophageal reflux (GERD) 12/22/2015   ??? History of substance abuse 12/22/2015   ??? Microscopic hematuria 12/22/2015   ??? Thrombocytopenia (CMS-HCC) 12/22/2015   ??? Vitamin D deficiency 12/22/2015   ??? Intermittent vertigo 08/11/2015   ??? History of hepatitis C virus infection 07/22/2015   ??? Hyperglycemia, unspecified 07/22/2015   ??? Dysrhythmia 07/15/2014   ??? Calculus of ureter 04/20/2013   ??? Hematuria 04/18/2013   ??? Chronic pain disorder 01/09/2013   ??? Postlaminectomy syndrome, lumbar region 01/09/2013   ??? Hepatitis C 07/17/2012   ??? Low back pain 07/17/2012     Past Medical History:   Diagnosis Date   ??? Cancer (CMS-HCC)    ??? Cirrhosis (CMS-HCC)    ??? Diabetes (CMS-HCC)    ??? Hepatitis C 07/17/2012   ??? Liver disease    ??? Low back pain 07/17/2012   ??? Varices, esophageal (CMS-HCC)        Past Surgical History:   Procedure Laterality Date   ??? ANKLE SURGERY     ??? BACK SURGERY     ??? BACK SURGERY     ??? CHG US GUIDE, TISSUE ABLATION N/A 01/22/2016    Procedure: ULTRASOUND GUIDANCE FOR, AND MONITORING OF, PARENCHYMAL TISSUE ABLATION;  Surgeon: Particia Nearing, MD;  Location: MAIN OR [facility];  Service: Transplant   ??? IR EMBOLIZATION ORGAN ISCHEMIA, TUMORS, INFAR  06/16/2016    IR EMBOLIZATION ORGAN ISCHEMIA, TUMORS, INFAR 06/16/2016 Ammie Dalton, MD IMG VIR H[facility]   ??? PR COLON CA SCRN NOT HI RSK IND N/A 02/27/2015    Procedure: COLOREC CNCR SCR;COLNSCPY NO;  Surgeon: Vonda Antigua, MD;  [facility];  Service: Gastroenterology   ??? PR ENDOSCOPIC ULTRASOUND EXAM N/A 02/27/2015    Procedure: UGI ENDO; W/ENDO ULTRASOUND EXAM INCLUDES ESOPHAGUS, STOMACH, &/OR DUODENUM/JEJUNUM;  Surgeon: Vonda Antigua, MD;  Location: GI PROCEDURES [facility];  Service: Gastroenterology   ??? PR ERCP STENT PLACEMENT BILIARY/PANCREATIC DUCT N/A 11/29/2016    Procedure: ENDOSCOPIC RETROGRADE CHOLANGIOPANCREATOGRAPHY (ERCP); WITH PLACEMENT OF ENDOSCOPIC STENT INTO BILIARY OR PANCREATIC DUCT;  Surgeon: Chriss Driver, MD;  Location: GI PROCEDURES MEMORIAL [facility];  Service: Gastroenterology   ??? PR ERCP,W/REMOVAL STONE,BIL/PANCR DUCTS N/A 11/29/2016    Procedure: ERCP; W/ENDOSCOPIC RETROGRADE REMOVAL OF CALCULUS/CALCULI FROM BILIARY &/OR PANCREATIC DUCTS;  Surgeon: Chriss Driver, MD;  Location: GI PROCEDURES [facility];  Service: Gastroenterology   ??? PR INSER HEART TEMP PACER ONE CHMBR N/A 10/02/2016  Procedure: Tempoarary Pacemaker Insertion;  Surgeon: Meredith Leeds, MD;  Location: [facility] EP; Service: Cardiology   ??? PR LAP,DIAGNOSTIC ABDOMEN N/A 01/22/2016    Procedure: Laparoscopy, Abdomen, Peritoneum, & Omentum, Diagnostic, W/Wo Collection Specimen(S) By Brushing Or Washing;  Surgeon: Particia Nearing, MD;  Location: MAIN OR [facility];  Service: Transplant   ??? PR PLACE PERCUT GASTROSTOMY TUBE N/A 11/17/2016    Procedure: UGI ENDO; W/DIRECTED PLCMT PERQ GASTROSTOMY TUBE;  Surgeon: Cletis Athens, MD;  Location: GI PROCEDURES MEMORIAL [facility];  Service: Gastroenterology   ??? PR TRACHEOSTOMY, PLANNED N/A 09/29/2016    Procedure: TRACHEOSTOMY PLANNED (SEPART PROC);  Surgeon: Katherina Mires, MD;  Location: MAIN OR [facility];  Service: Trauma   ??? PR TRANSCATH INSERT OR REPLACE LEADLESS PM VENTR N/A 10/11/2016    Procedure: Pacemaker Implant/Replace Leadless;  Surgeon: Meredith Leeds, MD;  Location: [facility] EP;  Service: Cardiology   ??? PR TRANSPLANT LIVER,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    Procedure: Liver Allotransplantation; Orthotopic, Partial Or Whole, From Cadaver Or Living Donor, Any Age;  Surgeon: Doyce Loose, MD;  Location: MAIN OR [facility];  Service: Transplant   ??? PR TRANSPLANT,PREP DONOR LIVER, WHOLE N/A 09/15/2016    Procedure: Rogelia Boga Std Prep Cad Donor Whole Liver Gft Prior Tnsplnt,Inc Chole,Diss/Rem Surr Tissu Wo Triseg/Lobe Splt;  Surgeon: Doyce Loose, MD;  Location: MAIN OR [facility];  Service: Transplant   ??? PR UPPER GI ENDOSCOPY,BIOPSY N/A 07/17/2012    Procedure: UGI ENDOSCOPY; WITH BIOPSY, SINGLE OR MULTIPLE;  Surgeon: Alba Destine, MD;  Location: GI PROCEDURES [facility];  Service: Gastroenterology   ??? PR UPPER GI ENDOSCOPY,DIAGNOSIS N/A 02/04/2014    Procedure: UGI ENDO, INCLUDE ESOPHAGUS, STOMACH, & DUODENUM &/OR JEJUNUM; DX W/WO COLLECTION SPECIMN, BY BRUSH OR WASH;  Surgeon: Wilburt Finlay, MD;  Location: GI PROCEDURES [facility]IAL [facility];  Service: Gastroenterology   ??? PR UPPER GI ENDOSCOPY,LIGAT VARIX N/A 11/05/2013    Procedure: UGI ENDO; W/BAND LIG ESOPH &/OR GASTRIC VARICES;  Surgeon: Wilburt Finlay, MD;  Location: GI PROCEDURES [facility];  Service: Gastroenterology     Social History   Substance Use Topics   ??? Smoking status: Never Smoker   ??? Smokeless tobacco: Never Used      Comment: Smoked in high school for about 5 years.    ??? Alcohol use No     Family History   Problem Relation Age of Onset   ??? Hypertension Mother    ??? Cirrhosis Neg Hx    ??? Liver cancer Neg Hx          Pain:  (P) 7/10 back pain, stomach pain    Prior Functional Status:   Dysphagia History: (P) no known h/o dysphagia PTA    Diet Prior to this Study: (P) Dysphagia 3, HTL       Recent Procedures/Tests/Findings:  (P) CXR 10/22/16: Right pleural effusion with associated atelectasis. Mild cardiomegaly. No focal airspace disease.    Objective    Cognitive-Communication Status : (P) functional for study   Oral Mechanism : (P) grossly WFL    Respiratory Status : (P) Room air    Secretions : (P) managing well      Positioning : (P) Upright in chair    Bolus Presentation : (P) Fed self;Cup Sip;Straw Sip      THIN LIQUID  Oral Stage: (P) Delayed bolus transit;No anterior labial bolus  loss    Swallow Initiation : (P) Base of tongue swallow initiation    Nasopharyngeal Reflux : (  P) None noted   Pharyngeal Stage : (P) Mildly reduced laryngeal excursion;Partial epiglottic inversion;Laryngeal Closure Reduced;Pharyngeal Constriction moderately impaired;Residue after swallow;Tongue Base Retraction mildly impaired    Penetration Aspiration Score: (P) 4 - Material enters the airway, contacts the vocal folds, and is ejected from the airway.    Aspiration Volume : (P) None   Esophageal Phase Screening : (P) Retrograde movement of bolus noted          SOLIDS  Oral Stage : (P) No anterior labial bolus loss;Piecemeal deglutition;Slow;Adequate mastication    Swallow Initiation : (P) WFL    Nasopharyngeal Reflux: (P) None noted    Pharyngeal Stage : (P) Mildly reduced laryngeal excursion;Partial epiglottic inversion;Laryngeal Closure Reduced;Pharyngeal Constriction moderately impaired;Residue after swallow;Tongue Base Retraction mildly impaired    Penetration Aspiration Score: (P) 1 - Material does not enter airway    Aspiration Volume : (P) None   Esophageal Phase Screening : (P) Retrograde movement of bolus noted    Bolus Comments : (P) Patient benefited from a liquid wash following solids to reduce laryngeal residue.            Session Duration : (P) 60  Activity Tolerance: (P) Patient tolerated treatment well;Treatment limited secondary to medical complications (Comment) (pt w/ emesis at end of study)    I attest that I have reviewed the above information.  Signed: Ethelda Chick, CCC-SLP  Filed 12/08/2016

## 2016-12-08 NOTE — Unmapped (Signed)
Physical Medicine and Rehabilitation  Daily Progress Note [facility]    ASSESSMENT:     ??Jeffrey Ward??is a 66 y.o.??male??admitted to [facility] for physical rehabilitation s/p liver transplant (09/15/16).  ??  Primary Rehab diagnosis: (Debility) 16 Debility (Non-Cardiac/Non-Pulmonary)    PLAN:     REHAB:   - PT and OT to maximize functional status with mobility and ADLs as well as prevention of joint contracture.   - SLP for cognitive and swallow function.  - RT for community re-integration, education, and leisure support services.  - Pharmacy consult for patient and family education on medication management.   - To be discussed in weekly Interdisciplinary Team Conference.  ??  S/p Liver transplant (09/15/16) For chronic hep C and HCC: Liver ultrasound on 11/03/16 showing mild to moderate intrahepatic biliary duct dilation which appeared to be new from prior ultrasound. Liver biopsy on 11/04/16 did not show acute rejection.The patient had significantly worsening LFTs on 11/25/16 And transplant ultrasound was performed showing RIs stable, vessels patent, mild increased dilation of CBD. ERCP was performed by GI at the recommendation of transplant on 11/29/16 and 2 stents were placed. Biopsy was obtained. The patient's LFTs have trended down as of 12/01/16.   - f/u transplant recs  - Continue cyclosporine, CellCept  - continue Valcyte, Bactrim for ppx  - CTM LFTs on daily CMP  ??  GIB: Patient had GI bleed from an ulcer which was clipped on 11/20/16 by gastroenterology.   - Protonix 40 mg BID  ??  Paroxismal A-fib: Patient on digoxin, amiodarone, warfarin for a-fib. Patient recently had GI bleed and warfarin was restarted on 9/11. INR was 1.69 on 11/24/16. Goal INR is 2-3. The patient's INR was 4.71 on 11/30/16 this is likely due to an interaction of the warfarin with Zosyn which was prescribed prophylactically after biopsy. The patient only needed 24 hours of Zosyn. Warfarin was held for 1 dose on 11/30/16 due to the elevated INR. It had come back down to 2.9 the following day and warfarin was restarted.  - Continue amiodarone, digoxin, warfarin  - follow up cardiology as outpatient  ??  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  ??  Right Heart Failure. Stable: Patient reportedly had right heart failure postop after transplant.   ??  Severe PAH: The patient's wife reports that he was told that he had pulmonary hypertension prior to admission to hospital. 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  DM:   - Endocrine following, follow-up recommendations  ??  Delirium: Patient was on Zyprexa 7.5 mg nightly which was held due to prolonged QTc. No longer having issues with delerium  - QTc 412 on 9/19  ??  Dysphonia with dysphagia: The patient has been intubated multiple times throughout his hospitalization. Voice has been hoarse/hypophonic since intubations. 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 They believe that this is possibly secondary to multiple intubations and would like follow-up with him in 3-4 months to evaluate recovery of nerve function and discussed possible injection laryngoplasty  - currently on tube feeds  - Follow-up with ENT in 3-4 months  ??  Nausea/Vomiting: Patient had nausea and vomiting with tube feeds as recently as the morning of 11/24/16. This was thought to be due to high residuals. Tube feed rate was slowed. We'll continue to monitor for symptoms and continue to communicate with dietitian/nutritionist about tube feed recommendations.  -  Scopolamine patch  ??  Distal LE weakness/dysesthesias: On AIR admission, patient with left lower extremity weakness and numbness on the dorsal aspect of the left foot since surgery. He reported that he has developed some numbness to the right great toe. The patient developed a spreading dysesthesias of his legs to the mid tibial area bilaterally the day following admission to rehabilitation. 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 Neurology was consulted and believed that the symptoms are consistent with a radiculopathy. They recommended MRI lumbar spine which showed mild stenosis at L4-L5 and L5-S1. I spoke with neurology on 9/24 who stated that the patient likely has a peripheral neuropathy given MRI results. They state that they would recommend therapy at this time and consideration of EMG. We will plan to order EMG as outpatient as the patient's neuropathy is stable. We are favoring the diagnosis of critical illness neuropathy at this time as he has had a prolonged hospital stay.  - EMG as outpatient    DISPO: Patient to be discussed at weekly interdisciplinary team conference.   - ELOS: 9/29     SUBJECTIVE:     The patient had a fall out of bed yesterday evening. He did not sustain any injuries. He denied any pain or headaches afterwards. He did not hit his head or lose consciousness. The patient has no new complaints this morning. He states he has a very mild headache which is no worse than headaches that he has been getting intermittently since the hospitalization started. The patient denies chest pain, shortness of breath, nausea, vomiting, chills. He states that his lower extremity numbness/tingling/weakness has not changed since yesterday.    OBJECTIVE:     Vital signs (last 24 hours):  Temp:  [36.2 ??C-36.7 ??C] 36.2 ??C  Heart Rate:  [74-79] 74  Resp:  [18-20] 18  BP: (137-159)/(78-84) 137/84  SpO2:  [94 %-99 %] 94 %    Physical Exam:  GEN: NAD, Working with therapy in his room  EYES: EOMI  HENT: Hearing adequate for conversation, soft hoarse voice  RESP: NWOB on RA  CV: RRR  ABD: Nondistended  SKIN: no visible rashes   MSK: no notable contractures, the patient goes from seated position on his Rollator to standing and walking to his bed with his Rollator with the assistance of his physical therapist.   NEURO: follows commands well and answers questions appropriately  PSYCH: mood euthymic, affect appropriate

## 2016-12-08 NOTE — Unmapped (Signed)
Spoke with wife, Sabino Gasser to see if any further questions/concerns. [name] reported things are progressing towards going home Saturday and looking forward to pt being home. [name] reported she has post coord, Designer, industrial/product as well as on call coord for home  Caryl Ada Inpatient Transplant Nurse Cordinator 12/08/2016 4:55 PM

## 2016-12-08 NOTE — Unmapped (Signed)
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 Post-op course complicated by decompensation secondary to acute right heart failure, AKI requiring CRRT (resolved), severe pulmonary hypertension responsive to sildenafil, bradycardia/ asystole requiring pacemaker placement, atrial fibrilliation/ narrow complex tachycardia requiring anti-coagulation with warfarin and amiodarone/ digoxin, malnutrition/ debility, and altered mental status/ delirium improved with transition to cyclosporine and psychiatric medications. On 11/03/16 he had a significant spike in his LFTs s/p biopsy 8/24 negative for rejection. He continued to recover and was transferred to floor status 8/28. The patient was transferred back to the SICU 9/9 2/2 GI bleed following PEG placement. Once resuscitated and stabilized, he was transferred back to the floor 9/10. He was evaluated by AIR 9/13 and accepted for admission.     LFTs were elevated on 9/14.  Liver transplant U/S performed 9/14- RIs stable, vessels patent, mild increased dilation of CBD. 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       Plan:     -- Immunosuppresion per pharmacy  -- MRI abdomen for [facility] recurrence screening will be performed at 6 months    -- Transplant surgery will continue to follow until discharge    -- will need to plan for home TFs vs PO supplements -> defer to nutritionist    Subjective/Interval Events:   Slipped out of bed while reaching for bedside table - no acute injuries, no subjective complaints this am. Tolerating a dysphagia 3 diet and tube feeds without distention/nausea.     Objective:        Vital Signs:  BP 137/84  - Pulse 74  - Temp 36.2 ??C  - Resp 18  - Ht 172.7 cm (5' 8)  - Wt 69 kg (152 lb 1.9 oz)  - SpO2 94%  - BMI 23.13 kg/m??     Input/Output:  I/O last 3 completed shifts:  In: 804 [NG/GT:804]  Out: 423 [Urine:300; Emesis/NG output:120; Stool:3]    Physical Exam:  General: No acute distress, lying in bed  Neuro: Awake and interactive  Lungs: Normal work of breathing on room air, decannulation site healing well, voice stronger    Abdomen: Soft, non-tender, non-distended, incision well healed, G tube in place, capped    Lab Results   Component Value Date    WBC 1.2 (L) 11/28/2016    HGB 9.0 (L) 11/28/2016    HCT 26.1 (L) 11/28/2016    PLT 117 (L) 11/28/2016       Lab Results   Component Value Date    NA 134 (L) 12/08/2016    K 4.6 12/08/2016    CL 98 12/08/2016    CO2 27.0 12/08/2016    BUN 17 12/08/2016    CREATININE 1.06 12/08/2016    CALCIUM 9.3 12/08/2016    MG 2.0 11/24/2016    PHOS 3.2 11/24/2016     Lab Results   Component Value Date    ALKPHOS 206 (H) 12/08/2016    BILITOT 1.4 (H) 12/08/2016    BILIDIR 1.20 (H) 11/24/2016    PROT 5.4 (L) 12/08/2016    ALBUMIN 3.0 (L) 12/08/2016    ALT 44 12/08/2016    AST 20 12/08/2016  GGT 261 (H) 11/24/2016       Imaging:  No new imaging

## 2016-12-09 LAB — AST (SGOT): Aspartate aminotransferase:CCnc:Pt:Ser/Plas:Qn:: 23

## 2016-12-09 LAB — BASIC METABOLIC PANEL
ANION GAP: 5 mmol/L — ABNORMAL LOW (ref 9–15)
BUN / CREAT RATIO: 18
CALCIUM: 9.1 mg/dL (ref 8.5–10.2)
CHLORIDE: 100 mmol/L (ref 98–107)
CO2: 30 mmol/L (ref 22.0–30.0)
CREATININE: 1.1 mg/dL (ref 0.70–1.30)
EGFR MDRD AF AMER: >=60 mL/min/{1.73_m2} (ref >=60)
GLUCOSE RANDOM: 182 mg/dL — ABNORMAL HIGH (ref 65–179)
POTASSIUM: 4.8 mmol/L (ref 3.5–5.0)
SODIUM: 135 mmol/L (ref 135–145)

## 2016-12-09 LAB — COMPREHENSIVE METABOLIC PANEL
ALBUMIN: 3 g/dL — ABNORMAL LOW (ref 3.5–5.0)
ALKALINE PHOSPHATASE: 204 U/L — ABNORMAL HIGH (ref 38–126)
ALT (SGPT): 42 U/L (ref 19–72)
ANION GAP: 6 mmol/L — ABNORMAL LOW (ref 9–15)
AST (SGOT): 23 U/L (ref 19–55)
BILIRUBIN TOTAL: 1.3 mg/dL — ABNORMAL HIGH (ref 0.0–1.2)
BLOOD UREA NITROGEN: 18 mg/dL (ref 7–21)
CALCIUM: 9.5 mg/dL (ref 8.5–10.2)
CHLORIDE: 97 mmol/L — ABNORMAL LOW (ref 98–107)
CREATININE: 1.03 mg/dL (ref 0.70–1.30)
EGFR MDRD AF AMER: >=60 mL/min/{1.73_m2} (ref >=60)
EGFR MDRD NON AF AMER: >=60 mL/min/{1.73_m2} (ref >=60)
GLUCOSE RANDOM: 179 mg/dL (ref 65–179)
POTASSIUM: 5.6 mmol/L — ABNORMAL HIGH (ref 3.5–5.0)
PROTEIN TOTAL: 5.5 g/dL — ABNORMAL LOW (ref 6.5–8.3)
SODIUM: 133 mmol/L — ABNORMAL LOW (ref 135–145)

## 2016-12-09 LAB — INR: Lab: 1.95

## 2016-12-09 LAB — CO2: Carbon dioxide:SCnc:Pt:Ser/Plas:Qn:: 30

## 2016-12-09 LAB — CYCLOSPORINE (FPIA) BLOOD: Lab: 248

## 2016-12-09 MED ORDER — LANCETS
Freq: Four times a day (QID) | 11 refills | 0.00000 days | Status: CP
Start: 2016-12-09 — End: 2017-12-09

## 2016-12-09 MED ORDER — BLOOD SUGAR DIAGNOSTIC STRIPS: each | 9 refills | 0 days

## 2016-12-09 MED ORDER — BLOOD-GLUCOSE METER
0 refills | 0 days
Start: 2016-12-09 — End: 2018-04-05

## 2016-12-09 MED ORDER — ACETAMINOPHEN 160 MG/5 ML ORAL SUSPENSION
0 refills | 0 days
Start: 2016-12-09 — End: 2017-10-20

## 2016-12-09 MED ORDER — INSULIN ASPART (U-100) 100 UNIT/ML (3 ML) SUBCUTANEOUS PEN: mL | Freq: Four times a day (QID) | 0 refills | 0 days | Status: SS

## 2016-12-09 MED ORDER — ACETAMINOPHEN 160 MG/5 ML ORAL LIQUID
ORAL | 0 refills | 0 days | Status: CP | PRN
Start: 2016-12-09 — End: 2017-02-06

## 2016-12-09 MED ORDER — MAGNESIUM OXIDE 400 MG (241.3 MG MAGNESIUM) TABLET
ORAL_TABLET | Freq: Two times a day (BID) | GASTROSTOMY | 11 refills | 0 days | Status: CP
Start: 2016-12-09 — End: 2016-12-12

## 2016-12-09 MED ORDER — DOCUSATE SODIUM 50 MG/5 ML ORAL LIQUID
Freq: Two times a day (BID) | GASTROSTOMY | 0 refills | 0.00000 days | Status: CP | PRN
Start: 2016-12-09 — End: 2017-01-01

## 2016-12-09 MED ORDER — INSULIN ASPART (U-100) 100 UNIT/ML (3 ML) SUBCUTANEOUS PEN
Freq: Four times a day (QID) | SUBCUTANEOUS | 0 refills | 0.00000 days | Status: SS
Start: 2016-12-09 — End: 2017-01-12

## 2016-12-09 MED ORDER — MELATONIN 3 MG TABLET
ORAL_TABLET | Freq: Every evening | GASTROSTOMY | 0 refills | 0.00000 days | Status: SS
Start: 2016-12-09 — End: 2017-01-13

## 2016-12-09 MED ORDER — PEN NEEDLE, DIABETIC 32 GAUGE X 5/32" (4 MM)
Freq: Four times a day (QID) | 1 refills | 0.00000 days | Status: CP
Start: 2016-12-09 — End: 2017-10-23

## 2016-12-09 MED ORDER — THIAMINE HCL (VITAMIN B1) 100 MG TABLET
ORAL_TABLET | Freq: Every day | GASTROSTOMY | 11 refills | 0 days | Status: SS
Start: 2016-12-09 — End: 2017-01-12

## 2016-12-09 MED ORDER — CHOLECALCIFEROL (VITAMIN D3) 50 MCG (2,000 UNIT) CAPSULE
ORAL_CAPSULE | Freq: Every day | GASTROSTOMY | 11 refills | 0 days | Status: CP
Start: 2016-12-09 — End: 2017-12-09

## 2016-12-09 MED ORDER — OMEPRAZOLE 20 MG CAPSULE,DELAYED RELEASE: 20 mg | capsule | Freq: Every day | 0 refills | 0 days | Status: AC

## 2016-12-09 MED ORDER — SILDENAFIL (PULMONARY HYPERTENSION) 20 MG TABLET
ORAL_TABLET | Freq: Three times a day (TID) | GASTROSTOMY | 0 refills | 0.00000 days | Status: CP
Start: 2016-12-09 — End: 2017-01-06

## 2016-12-09 MED ORDER — PANTOPRAZOLE DR 40 MG GRANULES DELAYED-RELEASE FOR SUSP IN PACKET
PACK | Freq: Every day | GASTROSTOMY | 0 refills | 0.00000 days | Status: CP
Start: 2016-12-09 — End: 2016-12-09

## 2016-12-09 MED ORDER — ASPIRIN 81 MG CHEWABLE TABLET
ORAL_TABLET | Freq: Every day | GASTROSTOMY | 0 refills | 0 days | Status: CP
Start: 2016-12-09 — End: 2017-01-02

## 2016-12-09 MED ORDER — LANCING DEVICE
0 refills | 0 days
Start: 2016-12-09 — End: 2018-04-05

## 2016-12-09 MED ORDER — LANCETS 30 GAUGE
11 refills | 0 days
Start: 2016-12-09 — End: 2018-06-03

## 2016-12-09 MED ORDER — POLYETHYLENE GLYCOL 3350 17 GRAM ORAL POWDER PACKET
Freq: Every day | GASTROSTOMY | 0 refills | 0 days | Status: CP | PRN
Start: 2016-12-09 — End: 2017-06-22

## 2016-12-09 MED ORDER — BLOOD SUGAR DIAGNOSTIC STRIPS: each | 7 refills | 0 days

## 2016-12-09 MED ORDER — INSULIN GLARGINE (U-100) 100 UNIT/ML (3 ML) SUBCUTANEOUS PEN
Freq: Every evening | SUBCUTANEOUS | 11 refills | 0.00000 days | Status: CP
Start: 2016-12-09 — End: 2016-12-09

## 2016-12-09 MED ORDER — INSULIN ASPART (U-100) 100 UNIT/ML (3 ML) SUBCUTANEOUS PEN: 4 [IU] | mL | Freq: Three times a day (TID) | 11 refills | 0 days | Status: AC

## 2016-12-09 MED ORDER — INSULIN ASPART (U-100) 100 UNIT/ML (3 ML) SUBCUTANEOUS PEN: mL | 10 refills | 0 days

## 2016-12-09 MED ORDER — SILDENAFIL (PULMONARY HYPERTENSION) 20 MG TABLET: tablet | 0 refills | 0 days

## 2016-12-09 MED ORDER — OMEPRAZOLE 20 MG CAPSULE,DELAYED RELEASE
ORAL_CAPSULE | Freq: Every day | ORAL | 0 refills | 0.00000 days | Status: CP
Start: 2016-12-09 — End: 2016-12-12

## 2016-12-09 MED ORDER — TRAMADOL 50 MG TABLET
ORAL_TABLET | Freq: Four times a day (QID) | GASTROSTOMY | 0 refills | 0 days | Status: CP | PRN
Start: 2016-12-09 — End: 2017-06-22

## 2016-12-09 MED ORDER — BLOOD-GLUCOSE METER KIT
0 refills | 0 days | Status: CP
Start: 2016-12-09 — End: ?

## 2016-12-09 MED ORDER — PEN NEEDLE, DIABETIC 32 GAUGE X 5/32" (4 MM): 1 | each | Freq: Four times a day (QID) | 3 refills | 0 days | Status: AC

## 2016-12-09 MED ORDER — BLOOD SUGAR DIAGNOSTIC STRIPS
Freq: Four times a day (QID) | 11 refills | 0.00000 days | Status: CP
Start: 2016-12-09 — End: 2016-12-09

## 2016-12-09 MED ORDER — AMIODARONE 200 MG TABLET
10 refills | 0 days
Start: 2016-12-09 — End: 2016-12-09

## 2016-12-09 MED ORDER — SULFAMETHOXAZOLE 200 MG-TRIMETHOPRIM 40 MG/5 ML ORAL SUSPENSION
0 refills | 0 days
Start: 2016-12-09 — End: 2016-12-09

## 2016-12-09 MED ORDER — WARFARIN 3 MG TABLET
ORAL_TABLET | Freq: Every day | GASTROSTOMY | 11 refills | 0 days | Status: CP
Start: 2016-12-09 — End: 2017-01-17

## 2016-12-09 MED ORDER — INSULIN GLARGINE (U-100) 100 UNIT/ML (3 ML) SUBCUTANEOUS PEN: mL | 10 refills | 0 days | Status: AC

## 2016-12-09 MED ORDER — DIGOXIN 125 MCG (0.125 MG) TABLET
ORAL_TABLET | Freq: Every day | GASTROSTOMY | 11 refills | 0 days | Status: CP
Start: 2016-12-09 — End: 2017-10-09

## 2016-12-09 MED FILL — SILACE/10MG/ML/LIQD: SILACE/10MG/ML/LIQD | 47 days supply | Qty: 473 | Fill #0

## 2016-12-09 MED FILL — ON CALL EXPRESS BLOOD GLU/METER/DEVI: ON CALL EXPRESS BLOOD GLU/METER/DEVI | 30 days supply | Qty: 1 | Fill #0

## 2016-12-09 MED FILL — ON CALL EXPRESS BLOOD GLU/EXPRESS/STRP: ON CALL EXPRESS BLOOD GLU/EXPRESS/STRP | 25 days supply | Qty: 100 | Fill #0

## 2016-12-09 MED FILL — SILDENAFIL CITRATE 2OMG/20MG/TAB: SILDENAFIL CITRATE 2OMG/20MG/TAB | 30 days supply | Qty: 135 | Fill #0

## 2016-12-09 MED FILL — NOVOLOG FLEXPEN(BOX)/100UNIT/ML/INJ: NOVOLOG FLEXPEN(BOX)/100UNIT/ML/INJ | 25 days supply | Qty: 1 | Fill #0

## 2016-12-09 MED FILL — ACETAMINOPHEN/160/5ML/SUSP: ACETAMINOPHEN/160/5ML/SUSP | 3 days supply | Qty: 118 | Fill #0

## 2016-12-09 MED FILL — ON CALL EXPRESS LANCING DEV/LANCING/DEVI: ON CALL EXPRESS LANCING DEV/LANCING/DEVI | 30 days supply | Qty: 1 | Fill #0

## 2016-12-09 MED FILL — AMIODARONE HCL/200MG/TABS: AMIODARONE HCL/200MG/TABS | 30 days supply | Qty: 30 | Fill #0

## 2016-12-09 MED FILL — UNIFINE PENTIPS 32GX4MM/32GX4MM/MISC: UNIFINE PENTIPS 32GX4MM/32GX4MM/MISC | 25 days supply | Qty: 100 | Fill #0

## 2016-12-09 MED FILL — ASPIRIN/81MG/CHE: ASPIRIN/81MG/CHE | 36 days supply | Qty: 1 | Fill #0

## 2016-12-09 MED FILL — OMEPRAZOLE 20MG/20MG/CPDR: OMEPRAZOLE 20MG/20MG/CPDR | 30 days supply | Qty: 30 | Fill #0

## 2016-12-09 MED FILL — ON CALL LANCETS/LANCETS/MISC: ON CALL LANCETS/LANCETS/MISC | 25 days supply | Qty: 100 | Fill #0

## 2016-12-09 MED FILL — CYCLOSPORINE MODIFIED ORAL/100MG/ML/SOLN: CYCLOSPORINE MODIFIED ORAL/100MG/ML/SOLN | 22 days supply | Qty: 100 | Fill #0

## 2016-12-09 MED FILL — SULFATRIM PEDIATRIC/200-40MG/5ML/SUSP: SULFATRIM PEDIATRIC/200-40MG/5ML/SUSP | 84 days supply | Qty: 360 | Fill #0

## 2016-12-09 MED FILL — BASAGLAR KWIKPEN/100UNIT/SOPN: BASAGLAR KWIKPEN/100UNIT/SOPN | 125 days supply | Qty: 1 | Fill #0

## 2016-12-09 NOTE — Unmapped (Signed)
Problem: Patient Care Overview  Goal: Plan of Care Review  Outcome: Progressing  AA&O x3. Set up for meals, feeds self, appetite poor.Continent of bowel and bladder Having loose stools this pm. No c/o pain. Getting bolus tube feedings, frequently refuses due to fullness. DVT,PE, falls and pressure ulcer prevention protocols observed.Safety precautions maintained with bed in low position, wheels locked and call bell in reach. Will continue to monitor.    Problem: Self-Care Deficit (Adult,Obstetrics,Pediatric)  Goal: Improved Ability to Perform BADL and IADL  Patient will demonstrate the desired outcomes by discharge/transition of care.   Outcome: Progressing   12/09/16 1646   Self-Care Deficit (Adult,Obstetrics,Pediatric)   Improved Ability to Perform BADL and IADL making progress toward outcome       Problem: Fall Risk (Adult)  Goal: Absence of Fall  Patient will demonstrate the desired outcomes by discharge/transition of care.   Outcome: Progressing   12/09/16 1646   Fall Risk (Adult)   Absence of Fall making progress toward outcome       Problem: Activity Intolerance (Adult)  Goal: Activity Tolerance  Patient will demonstrate the desired outcomes by discharge/transition of care.   Outcome: Progressing   12/09/16 1646   Activity Intolerance (Adult)   Activity Tolerance making progress toward outcome       Problem: Nutrition, Imbalanced: Inadequate Oral Intake (Adult)  Goal: Improved Oral Intake  Patient will demonstrate the desired outcomes by discharge/transition of care.   Outcome: Progressing   12/09/16 1646   Nutrition, Imbalanced: Inadequate Oral Intake (Adult)   Improved Oral Intake making progress toward outcome       Problem: Skin Injury Risk (Adult)  Goal: Skin Health and Integrity  Patient will demonstrate the desired outcomes by discharge/transition of care.   Outcome: Progressing   12/09/16 1646   Skin Injury Risk (Adult)   Skin Health and Integrity making progress toward outcome       Problem: VTE, DVT and PE (Adult)  Goal: Signs and Symptoms of Listed Potential Problems Will be Absent, Minimized or Managed (VTE, DVT and PE)  Signs and symptoms of listed potential problems will be absent, minimized or managed by discharge/transition of care (reference VTE, DVT and PE (Adult) CPG).   Outcome: Progressing   12/09/16 1646   VTE, DVT and PE (Adult)   Problems Assessed (VTE, DVT, PE) all   Problems Present (VTE, DVT, PE) none

## 2016-12-09 NOTE — Unmapped (Signed)
Adult Nutrition Transplant Discharge Note    Plan to discharge home on 9/29. Transplant education completed and documented on 9/28. Pt to discharge home on the following nutrition intervention: Dysphagia 3 Diet, thin liquids, bolus feeds of Osmolite 1.5 (1 can, 5x daily) + post-transplant food safety precautions.    Pt has RD contact information.    Will remain available to patient by phone, electronic communication, in Transplant Clinic and if readmitted to the hospital.    Donnella Bi

## 2016-12-09 NOTE — Unmapped (Signed)
Order was placed for PIV by Venous Access Team (VAT).  Patient was assessed for placement of a PIV. Access was obtained. Blood return noted.  Dressing intact and device well secured.  Flushed with normal saline.  Pt advised to inform RN of any s/s of discomfort at the PIV site.    Workup / Procedure Time:  15 minutes      The primary RN was notified.       Thank you,     Thelma Barge RN Venous Access Team

## 2016-12-09 NOTE — Unmapped (Signed)
Physical Medicine and Rehabilitation  Daily Progress Note [facility]    ASSESSMENT:     ??Jeffrey Ward??is a 65 y.o.??male??admitted to [facility] for physical rehabilitation s/p liver transplant (09/15/16).  ??  Primary Rehab diagnosis: (Debility) 16 Debility (Non-Cardiac/Non-Pulmonary)    PLAN:     REHAB:   - PT and OT to maximize functional status with mobility and ADLs as well as prevention of joint contracture.   - SLP for cognitive and swallow function.  - RT for community re-integration, education, and leisure support services.  - Pharmacy consult for patient and family education on medication management.   - To be discussed in weekly Interdisciplinary Team Conference.  ??  S/p Liver transplant (09/15/16) For chronic hep C and HCC: Liver ultrasound on 11/03/16 showing mild to moderate intrahepatic biliary duct dilation which appeared to be new from prior ultrasound. Liver biopsy on 11/04/16 did not show acute rejection.The patient had significantly worsening LFTs on 11/25/16 And transplant ultrasound was performed showing RIs stable, vessels patent, mild increased dilation of CBD. ERCP was performed by GI at the recommendation of transplant on 11/29/16 and 2 stents were placed. Biopsy was obtained. The patient's LFTs have trended down as of 12/01/16.   - f/u transplant recs  - Continue cyclosporine, CellCept  - continue Valcyte, Bactrim for ppx  - CTM LFTs on daily CMP  ??  GIB: Patient had GI bleed from an ulcer which was clipped on 11/20/16 by gastroenterology.   - Protonix 40 mg BID  ??  Paroxismal A-fib: Patient on digoxin, amiodarone, warfarin for a-fib. Patient recently had GI bleed and warfarin was restarted on 9/11. INR was 1.69 on 11/24/16. Goal INR is 2-3. The patient's INR was 4.71 on 11/30/16 this is likely due to an interaction of the warfarin with Zosyn which was prescribed prophylactically after biopsy. The patient only needed 24 hours of Zosyn. Warfarin was held for 1 dose on 11/30/16 due to the elevated INR. It had come back down to 2.9 the following day and warfarin was restarted.  - Continue amiodarone, digoxin, warfarin  - follow up cardiology as outpatient  ??  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  ??  Right Heart Failure. Stable: Patient reportedly had right heart failure postop after transplant.   ??  Severe PAH: The patient's wife reports that he was told that he had pulmonary hypertension prior to admission to hospital. 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  DM:   - Endocrine following, follow-up recommendations  ??  Delirium: Patient was on Zyprexa 7.5 mg nightly which was held due to prolonged QTc. No longer having issues with delerium  - QTc 412 on 9/19  ??  Dysphonia with dysphagia: The patient has been intubated multiple times throughout his hospitalization. Voice has been hoarse/hypophonic since intubations. 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 They believe that this is possibly secondary to multiple intubations and would like follow-up with him in 3-4 months to evaluate recovery of nerve function and discussed possible injection laryngoplasty  - currently on tube feeds  - Follow-up with ENT in 3-4 months  ??  Nausea/Vomiting: Patient had nausea and vomiting with tube feeds as recently as the morning of 11/24/16. This was thought to be due to high residuals. Tube feed rate was slowed. We'll continue to monitor for symptoms and continue to communicate with dietitian/nutritionist about tube feed recommendations.  -  Scopolamine patch  ??  Distal LE weakness/dysesthesias: On AIR admission, patient with left lower extremity weakness and numbness on the dorsal aspect of the left foot since surgery. He reported that he has developed some numbness to the right great toe. The patient developed a spreading dysesthesias of his legs to the mid tibial area bilaterally the day following admission to rehabilitation. 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 Neurology was consulted and believed that the symptoms are consistent with a radiculopathy. They recommended MRI lumbar spine which showed mild stenosis at L4-L5 and L5-S1. I spoke with neurology on 9/24 who stated that the patient likely has a peripheral neuropathy given MRI results. They state that they would recommend therapy at this time and consideration of EMG. We will plan to order EMG as outpatient as the patient's neuropathy is stable. We are favoring the diagnosis of critical illness neuropathy at this time as he has had a prolonged hospital stay.  - EMG as outpatient    DISPO: Patient to be discussed at weekly interdisciplinary team conference.   - ELOS: 9/29     SUBJECTIVE:     NAEON. The patient denies any new symptoms today. He is looking forward to going home tomorrow. We talked about his follow-up visits and making sure that he makes it to all of them and what they were for. The patient verbalized understanding.     OBJECTIVE:     Vital signs (last 24 hours):  Temp:  [36.6 ??C] 36.6 ??C  Heart Rate:  [77] 77  Resp:  [16-18] 18  BP: (141-154)/(71-79) 154/79  SpO2:  [95 %-96 %] 95 %    Physical Exam:  GEN: NAD, Working with therapy in his bathroom after just finishing up a shower  EYES: EOMI  HENT: Hearing adequate for conversation, soft hoarse voice  RESP: NWOB on RA  ABD: Nondistended  SKIN: no visible rashes   MSK: Moving all 4 extremities at least antigravity  NEURO: follows commands well and answers questions appropriately  PSYCH: mood euthymic, affect appropriate

## 2016-12-09 NOTE — Unmapped (Signed)
Problem: Patient Care Overview  Goal: Plan of Care Review  Outcome: Progressing  AA&O x3. Set up for meals, feeds self.Continent of bowel and bladder Having loose stools this pm. No c/o pain. Getting bolus tube feedings, ordered for 5 times/day but patient sometimes c/o fullness and refuses to take feeding. DVT,PE, falls and pressure ulcer prevention protocols observed.Safety precautions maintained with bed in low position, wheels locked and call bell in reach. Will continue to monitor.  ??    Problem: Self-Care Deficit (Adult,Obstetrics,Pediatric)  Goal: Identify Related Risk Factors and Signs and Symptoms  Related risk factors and signs and symptoms are identified upon initiation of Human Response Clinical Practice Guideline (CPG).   Outcome: Progressing   12/08/16 1746   Self-Care Deficit (Adult,Obstetrics,Pediatric)   Related Risk Factors (Self-Care Deficit) activity intolerance       Problem: Fall Risk (Adult)  Goal: Absence of Fall  Patient will demonstrate the desired outcomes by discharge/transition of care.   Outcome: Progressing   12/08/16 1746   Fall Risk (Adult)   Absence of Fall making progress toward outcome       Problem: Activity Intolerance (Adult)  Goal: Activity Tolerance  Patient will demonstrate the desired outcomes by discharge/transition of care.   Outcome: Progressing   12/08/16 1746   Activity Intolerance (Adult)   Activity Tolerance making progress toward outcome       Problem: Skin Injury Risk (Adult)  Goal: Skin Health and Integrity  Patient will demonstrate the desired outcomes by discharge/transition of care.   Outcome: Progressing   12/08/16 1746   Skin Injury Risk (Adult)   Skin Health and Integrity making progress toward outcome       Problem: VTE, DVT and PE (Adult)  Goal: Signs and Symptoms of Listed Potential Problems Will be Absent, Minimized or Managed (VTE, DVT and PE)  Signs and symptoms of listed potential problems will be absent, minimized or managed by discharge/transition of care (reference VTE, DVT and PE (Adult) CPG).   Outcome: Progressing   12/08/16 1746   VTE, DVT and PE (Adult)   Problems Assessed (VTE, DVT, PE) all   Problems Present (VTE, DVT, PE) none

## 2016-12-09 NOTE — Unmapped (Signed)
Transplant Surgery Progress Note    Hospital Day: 16    Assessment:     Jeffrey Wardis a 66 y.o.??male??with a history of IDDM, GERD, prior perioperative a-fib with RVR and 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 Post-op course complicated by decompensation secondary to acute right heart failure, AKI requiring CRRT (resolved), severe pulmonary hypertension responsive to sildenafil, bradycardia/ asystole requiring pacemaker placement, atrial fibrilliation/ narrow complex tachycardia requiring anti-coagulation with warfarin and amiodarone/ digoxin, malnutrition/ debility, and altered mental status/ delirium improved with transition to cyclosporine and psychiatric medications. On 11/03/16 he had a significant spike in his LFTs s/p biopsy 8/24 negative for rejection. He continued to recover and was transferred to floor status 8/28. The patient was transferred back to the SICU 9/9 2/2 GI bleed following PEG placement. Once resuscitated and stabilized, he was transferred back to the floor 9/10. He was evaluated by AIR 9/13 and accepted for admission.     LFTs were elevated on 9/14.  Liver transplant U/S performed 9/14- RIs stable, vessels patent, mild increased dilation of CBD. 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       Plan:     -- Immunosuppresion per pharmacy  -- MRI abdomen for [facility] recurrence screening will be performed at 6 months    -- Transplant surgery will continue to follow until discharge (EDD 9/29)    -- will need to plan for home TFs vs PO supplements -> defer to nutritionist    Subjective/Interval Events:   NAEO. Continuing to work with PT. Tolerating a dysphagia 3 diet and tube feeds without distention/nausea.     Objective:        Vital Signs:  BP 154/79  - Pulse 77  - Temp 36.6 ??C (Oral)  - Resp 18  - Ht 172.7 cm (5' 8)  - Wt 69 kg (152 lb 1.9 oz)  - SpO2 95%  - BMI 23.13 kg/m??     Input/Output:  I/O last 3 completed shifts:  In: 1151 [NG/GT:1151]  Out: 1320 [Urine:1320]    Physical Exam:  General: No acute distress, lying in bed  Neuro: Awake and interactive  Lungs: Normal work of breathing on room air, decannulation site healing well, voice stronger    Abdomen: Soft, non-tender, non-distended, incision well healed, G tube in place, capped    Lab Results   Component Value Date    WBC 2.0 (L) 12/08/2016    HGB 9.6 (L) 12/08/2016    HCT 29.8 (L) 12/08/2016    PLT 161 12/08/2016       Lab Results   Component Value Date    NA 133 (L) 12/09/2016    K 5.6 (H) 12/09/2016    CL 97 (L) 12/09/2016    CO2 30.0 12/09/2016    BUN 18 12/09/2016    CREATININE 1.03 12/09/2016    CALCIUM 9.5 12/09/2016    MG 1.3 (L) 12/08/2016    PHOS 3.3 12/08/2016     Lab Results   Component Value Date    ALKPHOS 204 (H) 12/09/2016    BILITOT 1.3 (H) 12/09/2016    BILIDIR 1.20 (H) 11/24/2016    PROT 5.5 (L) 12/09/2016    ALBUMIN 3.0 (L) 12/09/2016    ALT 42 12/09/2016    AST 23 12/09/2016    GGT 261 (H) 11/24/2016  Imaging:  No new imaging

## 2016-12-09 NOTE — Unmapped (Signed)
Adult Nutrition Follow Up    Pt is a 66 yo male s/p liver transplant 7/5    ASSESSMENT  Events since Last RD Visit: Pt remains in AIR, working well with therapy. Plan for d/c tmrw. Consult received from team for tube feed recommendations. The patient is wondering how close he is to no longer needing the feeding tube. Informed pt that TFs will be continued until he is able to meet nutrient needs via PO intake.  He is scheduled for follow up in outpatient clinic on 10/11; will reassess need for EN at this time. He strongly dislikes oral nutrition supplements and has been eating only Ensure Pudding. No documented intake in EPIC. Wife at bedside reports he has been eating a bit more although intake still significantly inadequate.    Labs: Reviewed; glucose POC 115-222 x 24 hrs  Meds: Reviewed; insulin regimen, mag sulfate, bowel regimen, thiamine  Abd/GI: last BM 9/28 x 2  Skin: see below  Patient Lines/Drains/Airways Status    Active Wounds     None                   Nutrition Orders            Start     Ordered    12/08/16 1416  Nutrition Therapy General (Regular); Thin Liquid  Effective now     Comments:  ASPIRATION PRECAUTIONS with small bites/sips, alternate solids/liquids, chin tuck, sitting upright for 30 minutes after eating   Question Answer Comment   Nutrition Therapy (T): General (Regular)    Dysphagia Liquids: Thin Liquid        12/08/16 1416    12/05/16 1228  Adult Enteral Nutrition Osmolite 1.5 (Standard 1.5 Cal)  Effective now     Comments:  --- start with 120 mL, run over pump x 1 hr   --- increase bolus by 30 mL q4 hrs   --- final goal: 1 can (237 mL) 5x daily (6a, 10a, 2p, 6p, 10p)   Question Answer Comment   Feeding Route: G Tube    Enteral Nutrition Bolus (mL): 120    Enteral Nutrition Bolus frequency: 5x day (increase bolus by 30cc q4hr)    Enteral Nutrition Bolus Final Goal (mL): 237    Feeding Tube Flush (mL) 50 mL    Feeding Tube Flush Type: Water    Feeding Tube Flush Frequency: Before and after each bolus    Enteral Nutrition Formula: Osmolite 1.5 (Standard 1.5 Cal)        12/05/16 1228        Anthropometric Data:  -- Height: 172.7 cm (5' 8)   -- Last recorded weight: 69 kg (152 lb 1.9 oz)  -- Admission weight: 76.7 kg  -- UBW: 155 lbs (70.4 kg)  -- IBW: 69.92 kg  -- Percent IBW: ~105%  -- AdjIBW: N/A  -- BMI: Body mass index is 23.13 kg/m??.   -- Weight changes this admission: +2.76L since 11/25/16  Last 5 Recorded Weights    11/24/16 2100 12/01/16 1700 12/02/16 1700 12/02/16 1755   Weight: 68.4 kg (150 lb 12.7 oz) 79.7 kg (175 lb 11.2 oz) 70 kg (154 lb 5.2 oz) 69 kg (152 lb 1.9 oz)      -- Weight history PTA: per EPIC wt hx, pt experienced ~8.4% wt loss x ~6 months. Wife reports loss some was intentional after changing dietary behaviors.  Wt Readings from Last 10 Encounters:   12/02/16 69 kg (152 lb 1.9 oz)  11/29/16 68 kg (150 lb)   11/22/16 67.8 kg (149 lb 7.6 oz)   10/08/16 73.5 kg (162 lb 0.6 oz)   09/06/16 75.2 kg (165 lb 12.6 oz)   08/02/16 71.1 kg (156 lb 12.8 oz)   07/27/16 70.2 kg (154 lb 11.2 oz)   07/05/16 71.7 kg (158 lb)   05/31/16 76 kg (167 lb 8 oz)   05/26/16 76.2 kg (167 lb 15.9 oz)     Nutrition Focused Physical Exam:   unable to complete NFPE 2/2 pt working with PT                        Estimated Nutrition Needs:   1610-9604 kcal/day (25-30 kcal/kg actual BW)  82-103 g protein/day (1.2-1.5 g/kg actual BW)  <45% total calories from CHO  Fluids per MD    Nutrition Assessment: pt is not meeting nutrient needs w/o supplemental bolus feeds. Seems to be tolerating new formula (Osmolite 1.5) well.     ASPEN/AND Malnutrition Screening:  Malnutrition Assessment using AND/ASPEN Clinical Characteristics:   unable to determine (pending NFPE)                      Nutrition Diagnosis:   Inadequate oral intake related to peri-op period as evidenced by NPO - resolving, PO diet advanced to Dys 3  Food and nutrition-related knowledge deficit as related to new liver transplant as evidenced by need for nutrition education prior to discharge - resolved    Nutrition Goals: diet advancement - met, basic understanding of nutrition education topic principle of food safety post liver transplant - met, tube feeds at goal w/in 1 week of ICU stay - met, weight maintenance - ongoing    Nutrition Action/Recommendations/Interventions:   1. Continue Dysphagia 3 Diet w/ thin liquids per SLP recs  2. Continue EN regimen of Osmolite 1.5, 1 can run over the pump x 1 hr q 4 hrs  --- goal 5 cans will provide 1185 mL, 1777 kcal, 74 g PRO, 241 g CHO, and 900 mL free water   -- additional FWF per team  Please ensure the appropriate formula is being used. Jevity 1.5 documented on calorie count and in nursing notes.   3. Educated pt and caregiver on food safety (3 month period ends 12/16/16)  --- Food Safety for Transplant Recipients handbook and RD contact info provided  --- documented in EPIC education tab  4. Request twice weekly weights  5. Please add Phos onto daily labs      Follow-up: 1-2 times per week (and more frequent as indicated)     Jackqulyn Livings MPH, RD, LDN  Pager: [phone]

## 2016-12-09 NOTE — Unmapped (Signed)
Problem: Patient Care Overview  Goal: Plan of Care Review  Outcome: Progressing  Pt is A&O: x3, no c/o pain, breathing regular & unlabored, chest expanded symmetrically, pt is on honey thick diet, meals with set up, pt is on accu check: Q6hrs, pt is on TF nutritional bolus: jevity 1.5cal 5xday(0600, 1000, 1400, 1800, 2200) via pump pre/post flush with water 50 ml, continent of bladder & bowel, transfers: SBA, bed mobility: independent, pt is on aspiration/falls/protective precautions, bed is in low position, call bell is within reach, will continue monitoring pt's conditions  Goal: Individualization and Mutuality  Outcome: Progressing    Goal: Discharge Needs Assessment  Outcome: Progressing    Goal: Interprofessional Rounds/Family Conf  Outcome: Progressing      Problem: Self-Care Deficit (Adult,Obstetrics,Pediatric)  Goal: Identify Related Risk Factors and Signs and Symptoms  Related risk factors and signs and symptoms are identified upon initiation of Human Response Clinical Practice Guideline (CPG).   Outcome: Progressing    Goal: Improved Ability to Perform BADL and IADL  Patient will demonstrate the desired outcomes by discharge/transition of care.   Outcome: Progressing      Problem: Fall Risk (Adult)  Goal: Identify Related Risk Factors and Signs and Symptoms  Related risk factors and signs and symptoms are identified upon initiation of Human Response Clinical Practice Guideline (CPG).   Outcome: Progressing    Goal: Absence of Fall  Patient will demonstrate the desired outcomes by discharge/transition of care.   Outcome: Progressing      Problem: Activity Intolerance (Adult)  Goal: Identify Related Risk Factors and Signs and Symptoms  Related risk factors and signs and symptoms are identified upon initiation of Human Response Clinical Practice Guideline (CPG).   Outcome: Progressing    Goal: Activity Tolerance  Patient will demonstrate the desired outcomes by discharge/transition of care.   Outcome: Progressing    Goal: Effective Energy Conservation Techniques  Patient will demonstrate the desired outcomes by discharge/transition of care.   Outcome: Progressing      Problem: Nutrition, Imbalanced: Inadequate Oral Intake (Adult)  Goal: Identify Related Risk Factors and Signs and Symptoms  Related risk factors and signs and symptoms are identified upon initiation of Human Response Clinical Practice Guideline (CPG).   Outcome: Progressing    Goal: Improved Oral Intake  Patient will demonstrate the desired outcomes by discharge/transition of care.   Outcome: Progressing    Goal: Prevent Further Weight Loss  Patient will demonstrate the desired outcomes by discharge/transition of care.   Outcome: Progressing      Problem: Infection, Risk/Actual (Adult)  Goal: Identify Related Risk Factors and Signs and Symptoms  Related risk factors and signs and symptoms are identified upon initiation of Human Response Clinical Practice Guideline (CPG).   Outcome: Progressing    Goal: Infection Prevention/Resolution  Patient will demonstrate the desired outcomes by discharge/transition of care.   Outcome: Progressing      Problem: Skin Injury Risk (Adult)  Goal: Identify Related Risk Factors and Signs and Symptoms  Related risk factors and signs and symptoms are identified upon initiation of Human Response Clinical Practice Guideline (CPG).   Outcome: Progressing    Goal: Skin Health and Integrity  Patient will demonstrate the desired outcomes by discharge/transition of care.   Outcome: Progressing      Problem: VTE, DVT and PE (Adult)  Goal: Signs and Symptoms of Listed Potential Problems Will be Absent, Minimized or Managed (VTE, DVT and PE)  Signs and symptoms of listed potential problems will be  absent, minimized or managed by discharge/transition of care (reference VTE, DVT and PE (Adult) CPG).   Outcome: Progressing

## 2016-12-09 NOTE — Unmapped (Signed)
Discharge Services and Resources:    Home Health Services  Nursing, Physical, Occupational and Speech Therapy  Agency: [facility] at [phone].   Start of Care is set for: 12/11/2016  Nursing will see you first and will call before arriving at your home.     Medical Equipment  Rollator  Agency: [facility] at [phone]  Equipment will be delivered to bedside. If you have questions regarding your equipment post discharge, please call the number listed above.     Enteral Feeds and Supplies  Agency: [employer] at [phone]  Equipment were delivered to the bedside. If you have questions regarding your supplies post discharge, please call the number listed above.     Medications:   [facility] will deliver your discharge medications to the bedside. You can use your local pharmacy going forward.     Disability Parking Placard  Please take the completed application to your local Department of Motor Vehicles [facility]). You can get up to two placards for $5.00 each.

## 2016-12-10 LAB — INR: Lab: 1.88

## 2016-12-10 LAB — COMPREHENSIVE METABOLIC PANEL
ALBUMIN: 3.3 g/dL — ABNORMAL LOW (ref 3.5–5.0)
ALKALINE PHOSPHATASE: 204 U/L — ABNORMAL HIGH (ref 38–126)
ALT (SGPT): 35 U/L (ref 19–72)
ANION GAP: 7 mmol/L — ABNORMAL LOW (ref 9–15)
AST (SGOT): 21 U/L (ref 19–55)
BILIRUBIN TOTAL: 1.3 mg/dL — ABNORMAL HIGH (ref 0.0–1.2)
BLOOD UREA NITROGEN: 22 mg/dL — ABNORMAL HIGH (ref 7–21)
BUN / CREAT RATIO: 21
CALCIUM: 9.3 mg/dL (ref 8.5–10.2)
CHLORIDE: 99 mmol/L (ref 98–107)
CREATININE: 1.07 mg/dL (ref 0.70–1.30)
EGFR MDRD AF AMER: >=60 mL/min/{1.73_m2} (ref >=60)
EGFR MDRD NON AF AMER: >=60 mL/min/{1.73_m2} (ref >=60)
GLUCOSE RANDOM: 112 mg/dL (ref 65–179)
PROTEIN TOTAL: 5.7 g/dL — ABNORMAL LOW (ref 6.5–8.3)
SODIUM: 133 mmol/L — ABNORMAL LOW (ref 135–145)

## 2016-12-10 LAB — CHLORIDE: Chloride:SCnc:Pt:Ser/Plas:Qn:: 99

## 2016-12-10 MED ORDER — AMIODARONE 200 MG TABLET
ORAL_TABLET | Freq: Every day | GASTROSTOMY | 11 refills | 0 days | Status: CP
Start: 2016-12-10 — End: 2017-01-17

## 2016-12-10 NOTE — Unmapped (Signed)
Patient was discharged with family. Patient was given discharge instruction. All questions answered. Home equipment and medications were sent home with patient.

## 2016-12-10 NOTE — Unmapped (Signed)
Problem: Patient Care Overview  Goal: Plan of Care Review  Outcome: Progressing  Pt is A&O: x3, no c/o pain, breathing regular & unlabored, chest expanded symmetrically, pt is on regular diet, meals with set up, pt is on accu check: Q6hrs, pt is on TF nutritional bolus: jevity 1.5cal 5xday(0600, 1000, 1400, 1800, 2200) via pump pre/post flush with water 50 ml, continent of bladder & bowel, transfers: SBA, bed mobility: independent, pt is on aspiration/falls/protective precautions, bed is in low position, call bell is within reach, will continue monitoring pt's conditions    Goal: Individualization and Mutuality  Outcome: Progressing    Goal: Discharge Needs Assessment  Outcome: Progressing    Goal: Interprofessional Rounds/Family Conf  Outcome: Progressing      Problem: Self-Care Deficit (Adult,Obstetrics,Pediatric)  Goal: Identify Related Risk Factors and Signs and Symptoms  Related risk factors and signs and symptoms are identified upon initiation of Human Response Clinical Practice Guideline (CPG).   Outcome: Progressing    Goal: Improved Ability to Perform BADL and IADL  Patient will demonstrate the desired outcomes by discharge/transition of care.   Outcome: Progressing      Problem: Fall Risk (Adult)  Goal: Identify Related Risk Factors and Signs and Symptoms  Related risk factors and signs and symptoms are identified upon initiation of Human Response Clinical Practice Guideline (CPG).   Outcome: Progressing    Goal: Absence of Fall  Patient will demonstrate the desired outcomes by discharge/transition of care.   Outcome: Progressing      Problem: Activity Intolerance (Adult)  Goal: Identify Related Risk Factors and Signs and Symptoms  Related risk factors and signs and symptoms are identified upon initiation of Human Response Clinical Practice Guideline (CPG).   Outcome: Progressing    Goal: Activity Tolerance  Patient will demonstrate the desired outcomes by discharge/transition of care.   Outcome: Progressing    Goal: Effective Energy Conservation Techniques  Patient will demonstrate the desired outcomes by discharge/transition of care.   Outcome: Progressing      Problem: Nutrition, Imbalanced: Inadequate Oral Intake (Adult)  Goal: Identify Related Risk Factors and Signs and Symptoms  Related risk factors and signs and symptoms are identified upon initiation of Human Response Clinical Practice Guideline (CPG).   Outcome: Progressing    Goal: Improved Oral Intake  Patient will demonstrate the desired outcomes by discharge/transition of care.   Outcome: Progressing    Goal: Prevent Further Weight Loss  Patient will demonstrate the desired outcomes by discharge/transition of care.   Outcome: Progressing      Problem: Infection, Risk/Actual (Adult)  Goal: Identify Related Risk Factors and Signs and Symptoms  Related risk factors and signs and symptoms are identified upon initiation of Human Response Clinical Practice Guideline (CPG).   Outcome: Progressing    Goal: Infection Prevention/Resolution  Patient will demonstrate the desired outcomes by discharge/transition of care.   Outcome: Progressing      Problem: Skin Injury Risk (Adult)  Goal: Identify Related Risk Factors and Signs and Symptoms  Related risk factors and signs and symptoms are identified upon initiation of Human Response Clinical Practice Guideline (CPG).   Outcome: Progressing    Goal: Skin Health and Integrity  Patient will demonstrate the desired outcomes by discharge/transition of care.   Outcome: Progressing      Problem: VTE, DVT and PE (Adult)  Goal: Signs and Symptoms of Listed Potential Problems Will be Absent, Minimized or Managed (VTE, DVT and PE)  Signs and symptoms of listed potential problems will  be absent, minimized or managed by discharge/transition of care (reference VTE, DVT and PE (Adult) CPG).   Outcome: Progressing

## 2016-12-10 NOTE — Unmapped (Signed)
Problem: Patient Care Overview  Goal: Plan of Care Review  Outcome: Progressing   12/09/16 1646 12/10/16 0027   OTHER   Plan of Care Reviewed With --  patient   Plan of Care Review   Progress improving --    Patient is A&O. Patient is continent of bowel and bladder, last BM was 12/09/16. Regular diet. Patient denied pain during shift. Safety and skin protocol maintained. Bed in lowest position, call bell within reach, will continue to monitor.     Goal: Discharge Needs Assessment  Outcome: Progressing   11/25/16 1209 12/09/16 1459   OTHER   Equipment Currently Used at Home cane, straight;glucometer  (Used a cane occasionally. ) --    Patient and/or family were provided with choice of facilities / services that are available and appropriate to meet post hospital care needs? --  Yes   Discharge Needs Assessment   Concerns to be Addressed adjustment to diagnosis/illness;care coordination/care conferences;discharge planning --    Readmission Within the Last 30 Days planned readmission  (Acute to [facility] admission) --    Transportation Anticipated --  family or friend will provide;car   Anticipated Changes Related to Illness inability to care for self --    Equipment Needed After Discharge other (see comments)  (Possibly oxygen, will try to wean patient. TBD regarding other HME) --    Discharge Facility/Level of Care Needs other (see comments)  (TBD) --      Goal: Interprofessional Rounds/Family Conf  Outcome: Progressing   12/09/16 1459   OTHER   Clinical EDD (Estimated Discharge Date)  12/10/16       Problem: Self-Care Deficit (Adult,Obstetrics,Pediatric)  Goal: Identify Related Risk Factors and Signs and Symptoms  Related risk factors and signs and symptoms are identified upon initiation of Human Response Clinical Practice Guideline (CPG).   Outcome: Progressing   12/07/16 1125 12/08/16 1746   Self-Care Deficit (Adult,Obstetrics,Pediatric)   Related Risk Factors (Self-Care Deficit) --  activity intolerance   Signs and Symptoms (Self-Care Deficit) decreased balance;gait unstable;weakness, paresis, paralysis --      Goal: Improved Ability to Perform BADL and IADL  Patient will demonstrate the desired outcomes by discharge/transition of care.   Outcome: Progressing   12/10/16 0927   Self-Care Deficit (Adult,Obstetrics,Pediatric)   Improved Ability to Perform BADL and IADL making progress toward outcome       Problem: Fall Risk (Adult)  Goal: Identify Related Risk Factors and Signs and Symptoms  Related risk factors and signs and symptoms are identified upon initiation of Human Response Clinical Practice Guideline (CPG).   Outcome: Progressing   12/07/16 1125   Fall Risk (Adult)   Related Risk Factors (Fall Risk) fatigue/slow reaction;gait/mobility problems;polypharmacy;objects hard to reach;environment unfamiliar   Signs and Symptoms (Fall Risk) presence of risk factors     Goal: Absence of Fall  Patient will demonstrate the desired outcomes by discharge/transition of care.   Outcome: Progressing   12/10/16 0927   Fall Risk (Adult)   Absence of Fall making progress toward outcome       Problem: Activity Intolerance (Adult)  Goal: Identify Related Risk Factors and Signs and Symptoms  Related risk factors and signs and symptoms are identified upon initiation of Human Response Clinical Practice Guideline (CPG).   Outcome: Progressing   12/07/16 1125   Activity Intolerance (Adult)   Related Risk Factors (Activity Intolerance) generalized weakness;pain   Signs and Symptoms (Activity Intolerance) pain/discomfort;nausea     Goal: Activity Tolerance  Patient will demonstrate the  desired outcomes by discharge/transition of care.   Outcome: Progressing   12/10/16 0927   Activity Intolerance (Adult)   Activity Tolerance making progress toward outcome     Goal: Effective Energy Conservation Techniques  Patient will demonstrate the desired outcomes by discharge/transition of care.   Outcome: Not Progressing   12/10/16 0927   Activity Intolerance (Adult)   Effective Energy Conservation Techniques making progress toward outcome       Problem: Nutrition, Imbalanced: Inadequate Oral Intake (Adult)  Goal: Identify Related Risk Factors and Signs and Symptoms  Related risk factors and signs and symptoms are identified upon initiation of Human Response Clinical Practice Guideline (CPG).   Outcome: Progressing   12/07/16 1125   Nutrition, Imbalanced: Inadequate Oral Intake (Adult)   Related Risk Factors (Nutrition Imbalance, Inadequate Oral Intake) appetite decreased;eating aversion   Signs and Symptoms (Nutrition Imbalance, Inadequate Oral Intake: Signs and Symptoms) weakness/lethargy;nausea and vomiting     Goal: Improved Oral Intake  Patient will demonstrate the desired outcomes by discharge/transition of care.   Outcome: Progressing   12/10/16 0927   Nutrition, Imbalanced: Inadequate Oral Intake (Adult)   Improved Oral Intake making progress toward outcome     Goal: Prevent Further Weight Loss  Patient will demonstrate the desired outcomes by discharge/transition of care.   Outcome: Progressing   12/10/16 4782   Nutrition, Imbalanced: Inadequate Oral Intake (Adult)   Prevent Further Weight Loss making progress toward outcome       Problem: Infection, Risk/Actual (Adult)  Goal: Identify Related Risk Factors and Signs and Symptoms  Related risk factors and signs and symptoms are identified upon initiation of Human Response Clinical Practice Guideline (CPG).   Outcome: Progressing   12/07/16 1125   Infection, Risk/Actual (Adult)   Related Risk Factors (Infection, Risk/Actual) prolonged hospitalization;medication effects;malnutrition;surgery/procedure   Signs and Symptoms (Infection, Risk/Actual) feeding/eating intolerance;weakness;pain     Goal: Infection Prevention/Resolution  Patient will demonstrate the desired outcomes by discharge/transition of care.   Outcome: Progressing   12/10/16 0927   Infection, Risk/Actual (Adult)   Infection Prevention/Resolution making progress toward outcome       Problem: Skin Injury Risk (Adult)  Goal: Identify Related Risk Factors and Signs and Symptoms  Related risk factors and signs and symptoms are identified upon initiation of Human Response Clinical Practice Guideline (CPG).   Outcome: Progressing   12/07/16 1125   Skin Injury Risk (Adult)   Related Risk Factors (Skin Injury Risk) hospitalization prolonged;medical devices;medication;mobility impaired;nutritional deficiencies     Goal: Skin Health and Integrity  Patient will demonstrate the desired outcomes by discharge/transition of care.   Outcome: Progressing   12/10/16 0927   Skin Injury Risk (Adult)   Skin Health and Integrity making progress toward outcome       Problem: VTE, DVT and PE (Adult)  Goal: Signs and Symptoms of Listed Potential Problems Will be Absent, Minimized or Managed (VTE, DVT and PE)  Signs and symptoms of listed potential problems will be absent, minimized or managed by discharge/transition of care (reference VTE, DVT and PE (Adult) CPG).   Outcome: Progressing   12/10/16 0927   VTE, DVT and PE (Adult)   Problems Assessed (VTE, DVT, PE) all   Problems Present (VTE, DVT, PE) none

## 2016-12-11 ENCOUNTER — Inpatient Hospital Stay: Admit: 2016-12-11 | Discharge: 2017-01-31 | Disposition: A | Discharge status: Home / Self Care | Payer: MEDICARE

## 2016-12-11 ENCOUNTER — Inpatient Hospital Stay
Admit: 2016-12-11 | Discharge: 2017-01-31 | Disposition: A | Discharge status: Home / Self Care | Payer: MEDICARE | Admitting: Physician Assistant

## 2016-12-11 DIAGNOSIS — M961 Postlaminectomy syndrome, not elsewhere classified: Secondary | ICD-10-CM

## 2016-12-11 DIAGNOSIS — K219 Gastro-esophageal reflux disease without esophagitis: Secondary | ICD-10-CM

## 2016-12-11 DIAGNOSIS — Z8505 Personal history of malignant neoplasm of liver: Secondary | ICD-10-CM

## 2016-12-11 DIAGNOSIS — I272 Pulmonary hypertension, unspecified: Secondary | ICD-10-CM

## 2016-12-11 DIAGNOSIS — N400 Enlarged prostate without lower urinary tract symptoms: Secondary | ICD-10-CM

## 2016-12-11 DIAGNOSIS — Z4823 Encounter for aftercare following liver transplant: Principal | ICD-10-CM

## 2016-12-11 DIAGNOSIS — E46 Unspecified protein-calorie malnutrition: Secondary | ICD-10-CM

## 2016-12-11 DIAGNOSIS — Z6821 Body mass index (BMI) 21.0-21.9, adult: Secondary | ICD-10-CM

## 2016-12-11 DIAGNOSIS — Z8619 Personal history of other infectious and parasitic diseases: Secondary | ICD-10-CM

## 2016-12-11 DIAGNOSIS — Z794 Long term (current) use of insulin: Secondary | ICD-10-CM

## 2016-12-11 DIAGNOSIS — Z9181 History of falling: Secondary | ICD-10-CM

## 2016-12-11 DIAGNOSIS — E1142 Type 2 diabetes mellitus with diabetic polyneuropathy: Secondary | ICD-10-CM

## 2016-12-11 DIAGNOSIS — Z431 Encounter for attention to gastrostomy: Secondary | ICD-10-CM

## 2016-12-11 DIAGNOSIS — Z950 Presence of cardiac pacemaker: Secondary | ICD-10-CM

## 2016-12-11 DIAGNOSIS — D649 Anemia, unspecified: Secondary | ICD-10-CM

## 2016-12-11 DIAGNOSIS — G894 Chronic pain syndrome: Secondary | ICD-10-CM

## 2016-12-11 DIAGNOSIS — I4891 Unspecified atrial fibrillation: Secondary | ICD-10-CM

## 2016-12-11 DIAGNOSIS — Z7982 Long term (current) use of aspirin: Secondary | ICD-10-CM

## 2016-12-12 ENCOUNTER — Ambulatory Visit: Admission: RE | Admit: 2016-12-12 | Discharge: 2016-12-12 | Disposition: A | Discharge status: Home / Self Care

## 2016-12-12 DIAGNOSIS — Z431 Encounter for attention to gastrostomy: Secondary | ICD-10-CM

## 2016-12-12 DIAGNOSIS — Z6821 Body mass index (BMI) 21.0-21.9, adult: Secondary | ICD-10-CM

## 2016-12-12 DIAGNOSIS — Z8619 Personal history of other infectious and parasitic diseases: Secondary | ICD-10-CM

## 2016-12-12 DIAGNOSIS — Z8505 Personal history of malignant neoplasm of liver: Secondary | ICD-10-CM

## 2016-12-12 DIAGNOSIS — Z950 Presence of cardiac pacemaker: Secondary | ICD-10-CM

## 2016-12-12 DIAGNOSIS — D649 Anemia, unspecified: Secondary | ICD-10-CM

## 2016-12-12 DIAGNOSIS — Z7982 Long term (current) use of aspirin: Secondary | ICD-10-CM

## 2016-12-12 DIAGNOSIS — K219 Gastro-esophageal reflux disease without esophagitis: Secondary | ICD-10-CM

## 2016-12-12 DIAGNOSIS — E1142 Type 2 diabetes mellitus with diabetic polyneuropathy: Secondary | ICD-10-CM

## 2016-12-12 DIAGNOSIS — Z794 Long term (current) use of insulin: Secondary | ICD-10-CM

## 2016-12-12 DIAGNOSIS — G894 Chronic pain syndrome: Secondary | ICD-10-CM

## 2016-12-12 DIAGNOSIS — N400 Enlarged prostate without lower urinary tract symptoms: Secondary | ICD-10-CM

## 2016-12-12 DIAGNOSIS — Z9181 History of falling: Secondary | ICD-10-CM

## 2016-12-12 DIAGNOSIS — M961 Postlaminectomy syndrome, not elsewhere classified: Secondary | ICD-10-CM

## 2016-12-12 DIAGNOSIS — Z4823 Encounter for aftercare following liver transplant: Principal | ICD-10-CM

## 2016-12-12 DIAGNOSIS — I4891 Unspecified atrial fibrillation: Secondary | ICD-10-CM

## 2016-12-12 DIAGNOSIS — I272 Pulmonary hypertension, unspecified: Secondary | ICD-10-CM

## 2016-12-12 DIAGNOSIS — E46 Unspecified protein-calorie malnutrition: Secondary | ICD-10-CM

## 2016-12-12 DIAGNOSIS — T8649 Other complications of liver transplant: Principal | ICD-10-CM

## 2016-12-12 LAB — MAGNESIUM: Magnesium:MCnc:Pt:Ser/Plas:Qn:: 1.5 — ABNORMAL LOW

## 2016-12-12 LAB — CBC W/ AUTO DIFF
BASOPHILS ABSOLUTE COUNT: 0 10*9/L (ref 0.0–0.1)
EOSINOPHILS ABSOLUTE COUNT: 0.1 10*9/L (ref 0.0–0.4)
HEMATOCRIT: 30.2 % — ABNORMAL LOW (ref 41.0–53.0)
HEMOGLOBIN: 10.1 g/dL — ABNORMAL LOW (ref 13.5–17.5)
LARGE UNSTAINED CELLS: 3 % (ref 0–4)
LYMPHOCYTES ABSOLUTE COUNT: 0.7 10*9/L — ABNORMAL LOW (ref 1.5–5.0)
MEAN CORPUSCULAR HEMOGLOBIN CONC: 33.3 g/dL (ref 31.0–37.0)
MEAN CORPUSCULAR HEMOGLOBIN: 31.1 pg (ref 26.0–34.0)
MEAN CORPUSCULAR VOLUME: 93.2 fL (ref 80.0–100.0)
MEAN PLATELET VOLUME: 9.1 fL (ref 7.0–10.0)
MONOCYTES ABSOLUTE COUNT: 0.1 10*9/L — ABNORMAL LOW (ref 0.2–0.8)
NEUTROPHILS ABSOLUTE COUNT: 1.3 10*9/L — ABNORMAL LOW (ref 2.0–7.5)
PLATELET COUNT: 133 10*9/L — ABNORMAL LOW (ref 150–440)
RED CELL DISTRIBUTION WIDTH: 20.3 % — ABNORMAL HIGH (ref 12.0–15.0)

## 2016-12-12 LAB — COMPREHENSIVE METABOLIC PANEL
ALBUMIN: 3.4 g/dL — ABNORMAL LOW (ref 3.5–5.0)
ALKALINE PHOSPHATASE: 180 U/L — ABNORMAL HIGH (ref 38–126)
ALT (SGPT): 35 U/L (ref 19–72)
ANION GAP: 11 mmol/L (ref 9–15)
AST (SGOT): 30 U/L (ref 19–55)
BILIRUBIN TOTAL: 1.4 mg/dL — ABNORMAL HIGH (ref 0.0–1.2)
BUN / CREAT RATIO: 24
CALCIUM: 9.6 mg/dL (ref 8.5–10.2)
CHLORIDE: 101 mmol/L (ref 98–107)
CO2: 23 mmol/L (ref 22.0–30.0)
CREATININE: 1.06 mg/dL (ref 0.70–1.30)
EGFR MDRD AF AMER: >=60 mL/min/{1.73_m2} (ref >=60)
EGFR MDRD NON AF AMER: >=60 mL/min/{1.73_m2} (ref >=60)
GLUCOSE RANDOM: 123 mg/dL (ref 65–179)
POTASSIUM: 4.4 mmol/L (ref 3.5–5.0)
PROTEIN TOTAL: 5.8 g/dL — ABNORMAL LOW (ref 6.5–8.3)
SODIUM: 135 mmol/L (ref 135–145)

## 2016-12-12 LAB — PHOSPHORUS: Phosphate:MCnc:Pt:Ser/Plas:Qn:: 3.9

## 2016-12-12 LAB — PROTIME: Lab: 14.6 — ABNORMAL HIGH

## 2016-12-12 LAB — BILIRUBIN DIRECT: Bilirubin.glucuronidated:MCnc:Pt:Ser/Plas:Qn:: 0.6 — ABNORMAL HIGH

## 2016-12-12 LAB — MONOCYTES ABSOLUTE COUNT: Lab: 0.1 — ABNORMAL LOW

## 2016-12-12 LAB — PROTIME-INR: INR: 1.28

## 2016-12-12 LAB — ALKALINE PHOSPHATASE: Alkaline phosphatase:CCnc:Pt:Ser/Plas:Qn:: 180 — ABNORMAL HIGH

## 2016-12-12 MED ORDER — SULFAMETHOXAZOLE 200 MG-TRIMETHOPRIM 40 MG/5 ML ORAL SUSPENSION
GASTROSTOMY | 0 refills | 0 days | Status: CP
Start: 2016-12-12 — End: 2017-01-10

## 2016-12-12 NOTE — Unmapped (Signed)
Called pt to f/u post d/c. [name]'s wife who reported that pt has had no n/v/d/f. She stated pt is tolerating oral intake well as well as tube feeds. Home health set them them up for tube feeds this morning. She stated she struggled with his medications but used the orange card to make sure he took the correct meds. Pill box was not fixed at d/c so she had to figure ot his meds. Offered to go over meds with her and she stated she thinks she got it figured out now. Asked her to page on call coordinator if she needs assistance. She expressed she was overwhelmed yesterday worried she would not do things right. Provided emotional support and re assured her that even though it may be overwhelming right now, primary coordinator and on call coordinator are just a phone call away when she needs assistance. Explained to her that it gets easier the more she does it. She was very grateful for the call and assured this coordinator that she would call if she needed assistance. She stated pt is very happy to be home and is adjusting very well.

## 2016-12-12 NOTE — Unmapped (Addendum)
Patient wife [name] unable to get the prilosec unablt to be crushed.  Little beads inside the capsule for the Prilosec are not able to be given by tube because they block it.  The bottle specifically states the beads are not to be crushed.  Magnesium was accidentally given on Sunday.  No issues with fever, nausea, vomiting, diarrhea, constipation. night sweats, or chills.    Confirmed with home heatlh labs 2xweek.      Spoke with Nutritionist confirming patient cleared to eat regular diet and thin liquids.  Per PA Shurney patient to stop Prilosec since it is causing issues in the tube. Sent copy of this information via email with copy of check list orange card to [facility] home health nurse.    Left VM for wife asking her to return my call confirming she can stop Prilosec but to call if patient experiences heart burn.

## 2016-12-13 DIAGNOSIS — Z9181 History of falling: Secondary | ICD-10-CM

## 2016-12-13 DIAGNOSIS — K219 Gastro-esophageal reflux disease without esophagitis: Secondary | ICD-10-CM

## 2016-12-13 DIAGNOSIS — G894 Chronic pain syndrome: Secondary | ICD-10-CM

## 2016-12-13 DIAGNOSIS — E46 Unspecified protein-calorie malnutrition: Secondary | ICD-10-CM

## 2016-12-13 DIAGNOSIS — Z8505 Personal history of malignant neoplasm of liver: Secondary | ICD-10-CM

## 2016-12-13 DIAGNOSIS — Z4823 Encounter for aftercare following liver transplant: Principal | ICD-10-CM

## 2016-12-13 DIAGNOSIS — Z794 Long term (current) use of insulin: Secondary | ICD-10-CM

## 2016-12-13 DIAGNOSIS — I272 Pulmonary hypertension, unspecified: Secondary | ICD-10-CM

## 2016-12-13 DIAGNOSIS — Z8619 Personal history of other infectious and parasitic diseases: Secondary | ICD-10-CM

## 2016-12-13 DIAGNOSIS — Z431 Encounter for attention to gastrostomy: Secondary | ICD-10-CM

## 2016-12-13 DIAGNOSIS — N400 Enlarged prostate without lower urinary tract symptoms: Secondary | ICD-10-CM

## 2016-12-13 DIAGNOSIS — I4891 Unspecified atrial fibrillation: Secondary | ICD-10-CM

## 2016-12-13 DIAGNOSIS — E1142 Type 2 diabetes mellitus with diabetic polyneuropathy: Secondary | ICD-10-CM

## 2016-12-13 DIAGNOSIS — M961 Postlaminectomy syndrome, not elsewhere classified: Secondary | ICD-10-CM

## 2016-12-13 DIAGNOSIS — Z7982 Long term (current) use of aspirin: Secondary | ICD-10-CM

## 2016-12-13 DIAGNOSIS — Z950 Presence of cardiac pacemaker: Secondary | ICD-10-CM

## 2016-12-13 DIAGNOSIS — D649 Anemia, unspecified: Secondary | ICD-10-CM

## 2016-12-13 DIAGNOSIS — Z6821 Body mass index (BMI) 21.0-21.9, adult: Secondary | ICD-10-CM

## 2016-12-13 LAB — CYCLOSPORINE (FPIA) BLOOD: Lab: 111

## 2016-12-14 DIAGNOSIS — E46 Unspecified protein-calorie malnutrition: Secondary | ICD-10-CM

## 2016-12-14 DIAGNOSIS — Z950 Presence of cardiac pacemaker: Secondary | ICD-10-CM

## 2016-12-14 DIAGNOSIS — Z794 Long term (current) use of insulin: Secondary | ICD-10-CM

## 2016-12-14 DIAGNOSIS — N400 Enlarged prostate without lower urinary tract symptoms: Secondary | ICD-10-CM

## 2016-12-14 DIAGNOSIS — Z431 Encounter for attention to gastrostomy: Secondary | ICD-10-CM

## 2016-12-14 DIAGNOSIS — E1142 Type 2 diabetes mellitus with diabetic polyneuropathy: Secondary | ICD-10-CM

## 2016-12-14 DIAGNOSIS — Z8619 Personal history of other infectious and parasitic diseases: Secondary | ICD-10-CM

## 2016-12-14 DIAGNOSIS — Z8505 Personal history of malignant neoplasm of liver: Secondary | ICD-10-CM

## 2016-12-14 DIAGNOSIS — K219 Gastro-esophageal reflux disease without esophagitis: Secondary | ICD-10-CM

## 2016-12-14 DIAGNOSIS — Z6821 Body mass index (BMI) 21.0-21.9, adult: Secondary | ICD-10-CM

## 2016-12-14 DIAGNOSIS — G894 Chronic pain syndrome: Secondary | ICD-10-CM

## 2016-12-14 DIAGNOSIS — M961 Postlaminectomy syndrome, not elsewhere classified: Secondary | ICD-10-CM

## 2016-12-14 DIAGNOSIS — I4891 Unspecified atrial fibrillation: Secondary | ICD-10-CM

## 2016-12-14 DIAGNOSIS — Z9181 History of falling: Secondary | ICD-10-CM

## 2016-12-14 DIAGNOSIS — Z7982 Long term (current) use of aspirin: Secondary | ICD-10-CM

## 2016-12-14 DIAGNOSIS — D649 Anemia, unspecified: Secondary | ICD-10-CM

## 2016-12-14 DIAGNOSIS — I272 Pulmonary hypertension, unspecified: Secondary | ICD-10-CM

## 2016-12-14 DIAGNOSIS — Z4823 Encounter for aftercare following liver transplant: Principal | ICD-10-CM

## 2016-12-14 MED ORDER — WARFARIN 1 MG TABLET
ORAL_TABLET | 11 refills | 0 days | Status: CP
Start: 2016-12-14 — End: 2016-12-26

## 2016-12-14 NOTE — Unmapped (Addendum)
Per PharmD Nedra Hai INR goal 2-3, digoxin goal 0.6-1, CSA goal 200-250.     Patient to get a digoxin level drawn with next lab draw.  Placed order.  Called [facility] [phone] to draw it.      Patient wife had a hard time doing medications over the weekend so the drug level may not be correct for the cyclosporine because she thinks she made a mistake with how she gave medications.  Starting Monday it has been correct since the home health nurse did medication education.   Educated wife about 12 hour tac trough dosing.      Coumadin increase to 4 mg daily.  Wife verbalized understanding and agreed to pick up the 1 mg coumadin tablet.      Reviewed patient last 24 hour dietary intake and reviewed with Nutritionist Mascarella.  Stated patient needs to eat a minimum of 2 meals a day and 3-3.5 cans of tube feeds a day.  Wife verbalized understanding.

## 2016-12-15 DIAGNOSIS — Z8619 Personal history of other infectious and parasitic diseases: Secondary | ICD-10-CM

## 2016-12-15 DIAGNOSIS — Z6821 Body mass index (BMI) 21.0-21.9, adult: Secondary | ICD-10-CM

## 2016-12-15 DIAGNOSIS — D649 Anemia, unspecified: Secondary | ICD-10-CM

## 2016-12-15 DIAGNOSIS — E1142 Type 2 diabetes mellitus with diabetic polyneuropathy: Secondary | ICD-10-CM

## 2016-12-15 DIAGNOSIS — Z7982 Long term (current) use of aspirin: Secondary | ICD-10-CM

## 2016-12-15 DIAGNOSIS — E46 Unspecified protein-calorie malnutrition: Secondary | ICD-10-CM

## 2016-12-15 DIAGNOSIS — K219 Gastro-esophageal reflux disease without esophagitis: Secondary | ICD-10-CM

## 2016-12-15 DIAGNOSIS — Z8505 Personal history of malignant neoplasm of liver: Secondary | ICD-10-CM

## 2016-12-15 DIAGNOSIS — Z950 Presence of cardiac pacemaker: Secondary | ICD-10-CM

## 2016-12-15 DIAGNOSIS — Z9181 History of falling: Secondary | ICD-10-CM

## 2016-12-15 DIAGNOSIS — Z431 Encounter for attention to gastrostomy: Secondary | ICD-10-CM

## 2016-12-15 DIAGNOSIS — N400 Enlarged prostate without lower urinary tract symptoms: Secondary | ICD-10-CM

## 2016-12-15 DIAGNOSIS — I4891 Unspecified atrial fibrillation: Secondary | ICD-10-CM

## 2016-12-15 DIAGNOSIS — M961 Postlaminectomy syndrome, not elsewhere classified: Secondary | ICD-10-CM

## 2016-12-15 DIAGNOSIS — Z4823 Encounter for aftercare following liver transplant: Principal | ICD-10-CM

## 2016-12-15 DIAGNOSIS — Z794 Long term (current) use of insulin: Secondary | ICD-10-CM

## 2016-12-15 DIAGNOSIS — G894 Chronic pain syndrome: Secondary | ICD-10-CM

## 2016-12-15 DIAGNOSIS — I272 Pulmonary hypertension, unspecified: Secondary | ICD-10-CM

## 2016-12-15 NOTE — Unmapped (Signed)
Confirmed with wife [facility] has his coumadin waiting for him.

## 2016-12-16 DIAGNOSIS — Z950 Presence of cardiac pacemaker: Secondary | ICD-10-CM

## 2016-12-16 DIAGNOSIS — Z8619 Personal history of other infectious and parasitic diseases: Secondary | ICD-10-CM

## 2016-12-16 DIAGNOSIS — Z8505 Personal history of malignant neoplasm of liver: Secondary | ICD-10-CM

## 2016-12-16 DIAGNOSIS — E46 Unspecified protein-calorie malnutrition: Secondary | ICD-10-CM

## 2016-12-16 DIAGNOSIS — I272 Pulmonary hypertension, unspecified: Secondary | ICD-10-CM

## 2016-12-16 DIAGNOSIS — N400 Enlarged prostate without lower urinary tract symptoms: Secondary | ICD-10-CM

## 2016-12-16 DIAGNOSIS — Z7982 Long term (current) use of aspirin: Secondary | ICD-10-CM

## 2016-12-16 DIAGNOSIS — Z4823 Encounter for aftercare following liver transplant: Principal | ICD-10-CM

## 2016-12-16 DIAGNOSIS — I4891 Unspecified atrial fibrillation: Secondary | ICD-10-CM

## 2016-12-16 DIAGNOSIS — Z6821 Body mass index (BMI) 21.0-21.9, adult: Secondary | ICD-10-CM

## 2016-12-16 DIAGNOSIS — D649 Anemia, unspecified: Secondary | ICD-10-CM

## 2016-12-16 DIAGNOSIS — Z794 Long term (current) use of insulin: Secondary | ICD-10-CM

## 2016-12-16 DIAGNOSIS — M961 Postlaminectomy syndrome, not elsewhere classified: Secondary | ICD-10-CM

## 2016-12-16 DIAGNOSIS — G894 Chronic pain syndrome: Secondary | ICD-10-CM

## 2016-12-16 DIAGNOSIS — K219 Gastro-esophageal reflux disease without esophagitis: Secondary | ICD-10-CM

## 2016-12-16 DIAGNOSIS — Z9181 History of falling: Secondary | ICD-10-CM

## 2016-12-16 DIAGNOSIS — Z431 Encounter for attention to gastrostomy: Secondary | ICD-10-CM

## 2016-12-16 DIAGNOSIS — E1142 Type 2 diabetes mellitus with diabetic polyneuropathy: Secondary | ICD-10-CM

## 2016-12-19 ENCOUNTER — Inpatient Hospital Stay
Admission: EM | Admit: 2016-12-19 | Discharge: 2016-12-20 | Disposition: A | Discharge status: Home / Self Care | Payer: MEDICARE | Source: Assisted Living Facility

## 2016-12-19 ENCOUNTER — Inpatient Hospital Stay
Admission: EM | Admit: 2016-12-19 | Discharge: 2016-12-20 | Disposition: A | Discharge status: Home / Self Care | Payer: MEDICARE | Source: Assisted Living Facility | Attending: Surgery | Admitting: Surgery

## 2016-12-19 DIAGNOSIS — K219 Gastro-esophageal reflux disease without esophagitis: Secondary | ICD-10-CM

## 2016-12-19 DIAGNOSIS — Z431 Encounter for attention to gastrostomy: Secondary | ICD-10-CM

## 2016-12-19 DIAGNOSIS — Z8619 Personal history of other infectious and parasitic diseases: Secondary | ICD-10-CM

## 2016-12-19 DIAGNOSIS — N400 Enlarged prostate without lower urinary tract symptoms: Secondary | ICD-10-CM

## 2016-12-19 DIAGNOSIS — Z8505 Personal history of malignant neoplasm of liver: Secondary | ICD-10-CM

## 2016-12-19 DIAGNOSIS — Z794 Long term (current) use of insulin: Secondary | ICD-10-CM

## 2016-12-19 DIAGNOSIS — Z4823 Encounter for aftercare following liver transplant: Principal | ICD-10-CM

## 2016-12-19 DIAGNOSIS — Z950 Presence of cardiac pacemaker: Secondary | ICD-10-CM

## 2016-12-19 DIAGNOSIS — M961 Postlaminectomy syndrome, not elsewhere classified: Secondary | ICD-10-CM

## 2016-12-19 DIAGNOSIS — R531 Weakness: Principal | ICD-10-CM

## 2016-12-19 DIAGNOSIS — Z7982 Long term (current) use of aspirin: Secondary | ICD-10-CM

## 2016-12-19 DIAGNOSIS — D649 Anemia, unspecified: Secondary | ICD-10-CM

## 2016-12-19 DIAGNOSIS — I4891 Unspecified atrial fibrillation: Secondary | ICD-10-CM

## 2016-12-19 DIAGNOSIS — E1142 Type 2 diabetes mellitus with diabetic polyneuropathy: Secondary | ICD-10-CM

## 2016-12-19 DIAGNOSIS — Z6821 Body mass index (BMI) 21.0-21.9, adult: Secondary | ICD-10-CM

## 2016-12-19 DIAGNOSIS — Z9181 History of falling: Secondary | ICD-10-CM

## 2016-12-19 DIAGNOSIS — E46 Unspecified protein-calorie malnutrition: Secondary | ICD-10-CM

## 2016-12-19 DIAGNOSIS — I272 Pulmonary hypertension, unspecified: Secondary | ICD-10-CM

## 2016-12-19 DIAGNOSIS — G894 Chronic pain syndrome: Secondary | ICD-10-CM

## 2016-12-19 DIAGNOSIS — B1920 Unspecified viral hepatitis C without hepatic coma: Secondary | ICD-10-CM

## 2016-12-19 LAB — CBC W/ AUTO DIFF
BASOPHILS ABSOLUTE COUNT: 0 10*9/L (ref 0.0–0.1)
EOSINOPHILS ABSOLUTE COUNT: 0 10*9/L (ref 0.0–0.4)
HEMATOCRIT: 33.5 % — ABNORMAL LOW (ref 41.0–53.0)
HEMOGLOBIN: 11.1 g/dL — ABNORMAL LOW (ref 13.5–17.5)
LYMPHOCYTES ABSOLUTE COUNT: 0.5 10*9/L — ABNORMAL LOW (ref 1.5–5.0)
MEAN CORPUSCULAR HEMOGLOBIN CONC: 33.3 g/dL (ref 31.0–37.0)
MEAN CORPUSCULAR VOLUME: 95.2 fL (ref 80.0–100.0)
MEAN PLATELET VOLUME: 8 fL (ref 7.0–10.0)
MONOCYTES ABSOLUTE COUNT: 0.1 10*9/L — ABNORMAL LOW (ref 0.2–0.8)
NEUTROPHILS ABSOLUTE COUNT: 1.1 10*9/L — ABNORMAL LOW (ref 2.0–7.5)
PLATELET COUNT: 80 10*9/L — ABNORMAL LOW (ref 150–440)
RED BLOOD CELL COUNT: 3.52 10*12/L — ABNORMAL LOW (ref 4.50–5.90)
RED CELL DISTRIBUTION WIDTH: 21.1 % — ABNORMAL HIGH (ref 12.0–15.0)
WBC ADJUSTED: 1.8 10*9/L — ABNORMAL LOW (ref 4.5–11.0)

## 2016-12-19 LAB — INR: Lab: 2.25

## 2016-12-19 LAB — MAGNESIUM: Magnesium:MCnc:Pt:Ser/Plas:Qn:: 1.5 — ABNORMAL LOW

## 2016-12-19 LAB — COMPREHENSIVE METABOLIC PANEL
ALKALINE PHOSPHATASE: 347 U/L — ABNORMAL HIGH (ref 38–126)
ALT (SGPT): 118 U/L — ABNORMAL HIGH (ref 19–72)
ANION GAP: 7 mmol/L — ABNORMAL LOW (ref 9–15)
BILIRUBIN TOTAL: 1.3 mg/dL — ABNORMAL HIGH (ref 0.0–1.2)
BLOOD UREA NITROGEN: 23 mg/dL — ABNORMAL HIGH (ref 7–21)
BUN / CREAT RATIO: 19
CALCIUM: 9.5 mg/dL (ref 8.5–10.2)
CHLORIDE: 103 mmol/L (ref 98–107)
CO2: 26 mmol/L (ref 22.0–30.0)
CREATININE: 1.24 mg/dL (ref 0.70–1.30)
EGFR MDRD AF AMER: >=60 mL/min/{1.73_m2} (ref >=60)
EGFR MDRD NON AF AMER: 58 mL/min/{1.73_m2} — ABNORMAL LOW (ref >=60)
GLUCOSE RANDOM: 67 mg/dL (ref 65–179)
POTASSIUM: 4.4 mmol/L (ref 3.5–5.0)
PROTEIN TOTAL: 6.2 g/dL — ABNORMAL LOW (ref 6.5–8.3)
SODIUM: 136 mmol/L (ref 135–145)

## 2016-12-19 LAB — DIGOXIN LEVEL: Digoxin:MCnc:Pt:Ser/Plas:Qn:: 0.7 — ABNORMAL LOW

## 2016-12-19 LAB — LYMPHOCYTES ABSOLUTE COUNT: Lab: 0.5 — ABNORMAL LOW

## 2016-12-19 LAB — PROTIME-INR: PROTIME: 25.6 s — ABNORMAL HIGH (ref 10.2–12.8)

## 2016-12-19 LAB — PHOSPHORUS: Phosphate:MCnc:Pt:Ser/Plas:Qn:: 3.6

## 2016-12-19 LAB — BILIRUBIN DIRECT: Bilirubin.glucuronidated:MCnc:Pt:Ser/Plas:Qn:: 0.7 — ABNORMAL HIGH

## 2016-12-19 LAB — AST (SGOT): Aspartate aminotransferase:CCnc:Pt:Ser/Plas:Qn:: 157 — ABNORMAL HIGH

## 2016-12-19 LAB — BILIRUBIN, DIRECT: BILIRUBIN DIRECT: 0.7 mg/dL — ABNORMAL HIGH (ref 0.00–0.40)

## 2016-12-19 NOTE — Unmapped (Signed)
Left VM requesting call back immediatly about liver numbers.    Was able to get wife on the phone.  Stated patient only able to have 1.5 cans of tube feed yesterday and none today.  Vomited yesterday.  States he will not eat meals if he receives the tube feed.     12/20/16 1pm plate of spaghetti, bread, lettuce     12/20/16 3 pm 1.5 can then vomited     12/19/16 10 am 1.5 waffles and 4 pieces of bacon    12/19/16 1:30 pm Pimento Cheese Sandwich and  Home Style Chicken Soup     Patient not feeling well according to wife not able to quantify why. Spoke with patient and he stated he feels fine and denies fevers, itching, jaundice, extremity swelling, or shortness of breath improved since d/c.  Patient is not keeping up with his caloric needs at home.      Spoke with Dr. Celine Mans who stated patient needs Liver US today given rise in LFTs.  [place] department not answering their phone so per Celine Mans patient to be admitted to observation to get US done. Spoke with patient logistics and confirmed bed available on [address]. Paged Dr. Rush Barer and Intern to make aware.  Called wife to make aware had to leave VM will try again in a few minutes.        Spoke with wife she stated she would be in with patient in one hour.

## 2016-12-20 DIAGNOSIS — G894 Chronic pain syndrome: Secondary | ICD-10-CM

## 2016-12-20 DIAGNOSIS — E46 Unspecified protein-calorie malnutrition: Secondary | ICD-10-CM

## 2016-12-20 DIAGNOSIS — E1142 Type 2 diabetes mellitus with diabetic polyneuropathy: Secondary | ICD-10-CM

## 2016-12-20 DIAGNOSIS — Z8619 Personal history of other infectious and parasitic diseases: Secondary | ICD-10-CM

## 2016-12-20 DIAGNOSIS — Z431 Encounter for attention to gastrostomy: Secondary | ICD-10-CM

## 2016-12-20 DIAGNOSIS — Z4823 Encounter for aftercare following liver transplant: Principal | ICD-10-CM

## 2016-12-20 DIAGNOSIS — M961 Postlaminectomy syndrome, not elsewhere classified: Secondary | ICD-10-CM

## 2016-12-20 DIAGNOSIS — Z950 Presence of cardiac pacemaker: Secondary | ICD-10-CM

## 2016-12-20 DIAGNOSIS — K219 Gastro-esophageal reflux disease without esophagitis: Secondary | ICD-10-CM

## 2016-12-20 DIAGNOSIS — Z6821 Body mass index (BMI) 21.0-21.9, adult: Secondary | ICD-10-CM

## 2016-12-20 DIAGNOSIS — D649 Anemia, unspecified: Secondary | ICD-10-CM

## 2016-12-20 DIAGNOSIS — I272 Pulmonary hypertension, unspecified: Secondary | ICD-10-CM

## 2016-12-20 DIAGNOSIS — N400 Enlarged prostate without lower urinary tract symptoms: Secondary | ICD-10-CM

## 2016-12-20 DIAGNOSIS — Z7982 Long term (current) use of aspirin: Secondary | ICD-10-CM

## 2016-12-20 DIAGNOSIS — I4891 Unspecified atrial fibrillation: Secondary | ICD-10-CM

## 2016-12-20 DIAGNOSIS — Z8505 Personal history of malignant neoplasm of liver: Secondary | ICD-10-CM

## 2016-12-20 DIAGNOSIS — Z794 Long term (current) use of insulin: Secondary | ICD-10-CM

## 2016-12-20 DIAGNOSIS — Z9181 History of falling: Secondary | ICD-10-CM

## 2016-12-20 DIAGNOSIS — R7989 Other specified abnormal findings of blood chemistry: Principal | ICD-10-CM

## 2016-12-20 LAB — COMPREHENSIVE METABOLIC PANEL
ALBUMIN: 3.2 g/dL — ABNORMAL LOW (ref 3.5–5.0)
ALKALINE PHOSPHATASE: 267 U/L — ABNORMAL HIGH (ref 38–126)
ALT (SGPT): 81 U/L — ABNORMAL HIGH (ref 19–72)
AST (SGOT): 49 U/L (ref 19–55)
BILIRUBIN TOTAL: 1.1 mg/dL (ref 0.0–1.2)
BLOOD UREA NITROGEN: 20 mg/dL (ref 7–21)
BUN / CREAT RATIO: 18
CALCIUM: 8.9 mg/dL (ref 8.5–10.2)
CHLORIDE: 103 mmol/L (ref 98–107)
CO2: 23 mmol/L (ref 22.0–30.0)
CREATININE: 1.12 mg/dL (ref 0.70–1.30)
EGFR MDRD AF AMER: >=60 mL/min/{1.73_m2} (ref >=60)
EGFR MDRD NON AF AMER: >=60 mL/min/{1.73_m2} (ref >=60)
GLUCOSE RANDOM: 153 mg/dL (ref 65–179)
POTASSIUM: 4.2 mmol/L (ref 3.5–5.0)
PROTEIN TOTAL: 5.5 g/dL — ABNORMAL LOW (ref 6.5–8.3)
SODIUM: 134 mmol/L — ABNORMAL LOW (ref 135–145)

## 2016-12-20 LAB — INR: Lab: 2.78

## 2016-12-20 LAB — SLIDE REVIEW

## 2016-12-20 LAB — CBC
HEMOGLOBIN: 10.3 g/dL — ABNORMAL LOW (ref 13.5–17.5)
MEAN CORPUSCULAR HEMOGLOBIN CONC: 33.2 g/dL (ref 31.0–37.0)
MEAN CORPUSCULAR HEMOGLOBIN: 31.4 pg (ref 26.0–34.0)
MEAN CORPUSCULAR VOLUME: 94.7 fL (ref 80.0–100.0)
RED BLOOD CELL COUNT: 3.28 10*12/L — ABNORMAL LOW (ref 4.50–5.90)
RED CELL DISTRIBUTION WIDTH: 20.6 % — ABNORMAL HIGH (ref 12.0–15.0)
WBC ADJUSTED: 1.7 10*9/L — ABNORMAL LOW (ref 4.5–11.0)

## 2016-12-20 LAB — ADDON DIFFERENTIAL ONLY
BASOPHILS ABSOLUTE COUNT: 0 10*9/L (ref 0.0–0.1)
EOSINOPHILS ABSOLUTE COUNT: 0.1 10*9/L (ref 0.0–0.4)
LARGE UNSTAINED CELLS: 2 % (ref 0–4)
LYMPHOCYTES ABSOLUTE COUNT: 0.6 10*9/L — ABNORMAL LOW (ref 1.5–5.0)
MONOCYTES ABSOLUTE COUNT: 0.1 10*9/L — ABNORMAL LOW (ref 0.2–0.8)
NEUTROPHILS ABSOLUTE COUNT: 0.9 10*9/L — ABNORMAL LOW (ref 2.0–7.5)

## 2016-12-20 LAB — MAGNESIUM: Magnesium:MCnc:Pt:Ser/Plas:Qn:: 1.3 — ABNORMAL LOW

## 2016-12-20 LAB — SMEAR REVIEW

## 2016-12-20 LAB — MEAN PLATELET VOLUME: Lab: 10

## 2016-12-20 LAB — HEPARIN CORRELATION: Lab: 0.2

## 2016-12-20 LAB — PHOSPHORUS: Phosphate:MCnc:Pt:Ser/Plas:Qn:: 3

## 2016-12-20 LAB — CYCLOSPORINE, TROUGH: Lab: 175

## 2016-12-20 LAB — SODIUM: Sodium:SCnc:Pt:Ser/Plas:Qn:: 134 — ABNORMAL LOW

## 2016-12-20 LAB — NEUTROPHILS ABSOLUTE COUNT: Lab: 0.9 — ABNORMAL LOW

## 2016-12-20 MED ORDER — MAGNESIUM OXIDE 400 MG (241.3 MG MAGNESIUM) TABLET: 400 mg | tablet | Freq: Two times a day (BID) | 11 refills | 0 days | Status: AC

## 2016-12-20 MED ORDER — MAGNESIUM OXIDE 400 MG (241.3 MG MAGNESIUM) TABLET
ORAL_TABLET | Freq: Two times a day (BID) | GASTROSTOMY | 11 refills | 0.00000 days | Status: CP
Start: 2016-12-20 — End: 2016-12-26

## 2016-12-20 MED ORDER — TRAMADOL 50 MG TABLET
ORAL_TABLET | Freq: Four times a day (QID) | ORAL | 0 refills | 0 days | Status: CP | PRN
Start: 2016-12-20 — End: 2017-06-22

## 2016-12-20 MED FILL — MAGNESIUM OXIDE/400MG/TABS: MAGNESIUM OXIDE/400MG/TABS | 60 days supply | Qty: 120 | Fill #0

## 2016-12-20 MED FILL — TRAMADOL HCL/50MG/TABS: TRAMADOL HCL/50MG/TABS | 5 days supply | Qty: 40 | Fill #0

## 2016-12-20 NOTE — Unmapped (Addendum)
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 Post-op course complicated by decompensation secondary to acute right heart failure,??AKI requiring CRRT (resolved), severe pulmonary hypertension responsive to sildenafil, bradycardia/ asystole requiring pacemaker placement, atrial fibrilliation/ narrow complex tachycardia requiring anti-coagulation with warfarin and amiodarone/ digoxin, malnutrition/ debility, and altered mental status/ delirium improved with transition to cyclosporine and psychiatric medications. He was discharged to [facility] 9/13 and discharged to home 12/10/16.     He was readmitted 12/19/16 for increased LFT's and poor PO intake. A liver US was performed and showed some biliary sludge, but was otherwise WNL. His LFTs began to downtrend on hospital day 1. He tolerated a regular diet. The nutritionist was consulted to review diet and supplementation regimen with him. He was deemed stable for discharge with close outpatient follow-up on 10/9 and discharged to home.

## 2016-12-20 NOTE — Unmapped (Signed)
Discharge Summary    Admit date: 12/19/2016    Discharge date and time: 12/20/2016    Discharge to:  Home    Discharge Service: Surg Transplant [facility])    Discharge Attending Physician: Particia Nearing, MD    Discharge  Diagnoses: Elevated LFT's    Secondary Diagnosis: Active Problems:    * No active hospital problems. *      OR Procedures:  None     Ancillary Procedures: no procedures    Discharge Day Services: The patient was seen by the surgical team. Vital signs and lab values were stable. Discharge instructions were reviewed with the patient and all questions were answered.     Subjective   No acute events overnight.    Objective   Patient Vitals for the past 8 hrs:   BP Temp Temp src Pulse Resp SpO2   12/20/16 1141 169/81 36.3 ??C Oral 75 18 97 %   12/20/16 0800 - - - 80 - -   12/20/16 0735 159/84 36.8 ??C Oral 75 18 95 %     I/O this shift:  In: 100 [IV Piggyback:100]  Out: -     General Appearance:   No acute distress  Lungs:                Clear to auscultation bilaterally  Heart:                           Regular rate and rhythm  Abdomen:                Soft, non-tender, non-distended, incision well healed, G tube in place  Extremities:              Warm and well perfused      Hospital Course:    Jeffrey Ward is a 69M with IDDM, GERD, prior perioperative a-fib with RVR and 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 Post-op course complicated by decompensation secondary to acute right heart failure,??AKI requiring CRRT (resolved), severe pulmonary hypertension responsive to sildenafil, bradycardia/ asystole requiring pacemaker placement, atrial fibrilliation/ narrow complex tachycardia requiring anti-coagulation with warfarin and amiodarone/ digoxin, malnutrition/ debility, and altered mental status/ delirium improved with transition to cyclosporine and psychiatric medications. He was discharged to [facility] 9/13 and discharged to home 12/10/16.     He was readmitted 12/19/16 for increased LFT's and poor PO intake. He was made NPO and started on IVF. Transplant liver ultrasound showed patent CBD and non-obstructing debris within the bile duct. His LFT's improved without intervention. He discussed tube feed regimen with nutrition and will supplement with bolus tube feeds when he does not consume >50% of his meals. He will return to clinic on 12/22/2016.      Condition at Discharge: Same  Discharge Medications:      Medication List      START taking these medications    magnesium oxide 400 mg (241.3 mg magnesium) tablet  Commonly known as:  MAG-OX  1 tablet (400 mg total) by G-tube route Two (2) times a day.     traMADol 50 mg tablet  Commonly known as:  ULTRAM  Take 1-2 tablets (50-100 mg total) by mouth every six (6) hours as needed.   for up to 5 days        CONTINUE taking these medications    acetaminophen 160 mg/5 mL solution  Commonly known as:  TYLENOL  Take 10.2 mL (325 mg total) by mouth every four (4) hours as needed for   pain.     amiodarone 200 MG tablet  Commonly known as:  PACERONE  1 tablet (200 mg total) by G-tube route daily.     aspirin 81 MG chewable tablet  1 tablet (81 mg total) by G-tube route daily.     blood sugar diagnostic Strp  Commonly known as:  FREESTYLE TEST  by Other route Four (4) times a day (before meals and nightly).     blood-glucose meter kit  Commonly known as:  glucose monitoring kit  Use as instructed     cholecalciferol (vitamin D3) 2,000 unit Cap  1 capsule (2,000 Units total) by PEG Tube route daily.     cycloSPORINE 100 mg/mL microemulsion solution  Commonly known as:  NEORAL  2.3 mL (230 mg total) by G-tube route Two (2) times a day.     digoxin 125 mcg tablet  Commonly known as:  LANOXIN  0.5 tablets (62.5 mcg total) by G-tube route daily.     docusate 50 mg/5 mL liquid  Commonly known as:  COLACE  10 mL (100 mg total) by G-tube route two (2) times a day as needed.     * insulin ASPART 100 unit/mL injection pen Commonly known as:  NovoLOG Flexpen U-100 Insulin  Inject 0.04 mL (4 Units total) under the skin Three (3) times a day before   meals.     * insulin ASPART 100 unit/mL injection pen  Commonly known as:  NovoLOG Flexpen U-100 Insulin  Inject 0-0.12 mL (0-12 Units total) under the skin Four (4) times a day   (before meals and nightly). Sliding scale.     insulin glargine 100 unit/mL (3 mL) injection pen  Commonly known as:  LANTUS  Inject 0.12 mL (12 Units total) under the skin nightly.     lancets Misc  1 each by Miscellaneous route Four (4) times a day (before meals and   nightly).     melatonin 3 mg Tab  1-2 tablets (3-6 mg total) by PEG Tube route nightly.     mycophenolate 200 mg/mL suspension  Commonly known as:  CELLCEPT  2.5 mL (500 mg total) by G-tube route Two (2) times a day (8am and 8pm).     pen needle, diabetic 32 gauge x 5/32 Ndle  Commonly known as:  ULTICARE PEN NEEDLE  1 each by Miscellaneous route Four (4) times a day (before meals and   nightly).     polyethylene glycol 17 gram packet  Commonly known as:  MIRALAX  17 g by G-tube route daily as needed.     sildenafil (antihypertensive) 20 mg tablet  Commonly known as:  REVATIO  1.5 tablets (30 mg total) by G-tube route Three (3) times a day. Dissolve   1 1/2 tablets in water and administer via PEG tube.     sulfamethoxazole-trimethoprim 200-40 mg/5 mL suspension  Commonly known as:  BACTRIM,SEPTRA  10 mL by G-tube route 3 (three) times a week.     thiamine 100 MG tablet  Commonly known as:  B-1  1 tablet (100 mg total) by G-tube route daily.     valGANciclovir 50 mg/mL Solr  Commonly known as:  VALCYTE  9 mL (450 mg total) by PEG Tube route daily.     * warfarin 3 MG tablet  Commonly known as:  COUMADIN  1 tablet (3 mg total) by G-tube route  daily with evening meal.     * warfarin 1 MG tablet  Commonly known as:  COUMADIN  4 mg with dinner Take one 3 mg pill and one 1 mg pill.        * This list has 4 medication(s) that are the same as other medications   prescribed for you. Read the directions carefully, and ask your doctor or   other care provider to review them with you.              Pending Test Results: None    Discharge Instructions:    Follow Up instructions and Outpatient Referrals     Discharge instructions       Activity: Do not lift > 10-15 lbs for 1st 6 weeks, then gradually increase to 25 lbs over the following 6 weeks. Resume heavy lifting only after being cleared to do so at follow-up appointment in Transplant Surgery clinic.    Diet: Regular diet. Please give yourself a bolus tube feed (250 cc) if eating <50% of your meals.     Other Instructions:     Your Building services engineer is Health and safety inspector. Contact your transplant coordinator or the Transplant Surgery Office [phone]) during business hours or page the transplant coordinator on call [phone]) after business hours for:    - fever >100.5 degrees F by mouth, any fever with shaking chills, or other signs or symptoms of infection   - uncontrolled nausea, vomiting, or diarrhea; inability to have a bowel movement for > 3 days.   - any problem that prevents taking medications as scheduled.   - pain uncontrolled with prescribed medication or new pain or tenderness at the surgical site   - sudden weight gain or increase in blood pressure (greater than 140/85)   - shortness of breath, chest pain / discomfort   - new or increasing jaundice   - urinary symptoms including pain / difficulty / burning or tea-colored urine   - any other new or concerning symptoms   - questions regarding your medications or continuing care      Patient may shower.    Inspect surgical sites at least twice daily, contact Transplant Coordinator for spreading redness, purulent discharge, or increasing bleeding or drainage, or for separation of wounds.     Maintain a written record of daily vital signs, per Handbook instructions.     Maintain a written record of medications taken and review against the discharge medications sheet (orange paper). Periodically review your Transplant Handbook for important information regarding postoperative care and required precautions.      Labs and Other Follow-ups after Discharge:     Labs      Liver  Labs 3x week: Ca, Mag, Phos, Na, K, CI, CO2, BUN, Creatinine, Glucose, CBC, and Cyclosporine trough level, AST, ALT, GGT, AP & T. Bilirubin    Coordinators (these people receive the lab results):  Mariea Stable phone [phone] fax [phone]               Future Appointments:  Appointments which have been scheduled for you    Dec 19, 2016 To Be Determined  SLP - HOME VISIT with Gareth Morgan, SLP  [facility] AND HOSPICE [facility]) [address]  [place] [address]  [phone]   Dec 20, 2016 To Be Determined  PT - HOME VISIT with Duanne Moron, PT  [facility] SCHEDULING [facility]  REGION) [address]  [place] [address]  [phone]   Dec 21, 2016 To Be Determined  OT - HOME VISIT with Graciella Freer, OT  [facility] AND HOSPICE [facility]) [address]  [place] [address]  [phone]   Dec 22, 2016 To Be Determined  PT - HOME VISIT with Duanne Moron, PT  [facility] AND HOSPICE [facility]) [address]  [place] [address]  [phone]   Dec 22, 2016  4:00 AM EDT  SN - HOME VISIT with Peterson Lombard, LPN  [facility] AND HOSPICE [facility]) [address]  [place] [address]  [phone]   Dec 22, 2016  8:30 AM EDT  LAB ONLY with SURTRA LAB ONLY  [facility] TRANSPLANT SURGERY [facility] REGION) [address]  [place] [address]  [phone]   Dec 22, 2016  9:00 AM EDT  SOCIAL WORK with Coralee Rud, MSW  [facility] LIVER TRANSPLANT [facility] REGION) [address]  [place] [address]  [phone]   Dec 22, 2016 10:30 AM EDT  RETURN NUTRITION with Norm Salt, RD/LDN  [facility] NUTRITION SERVICES TRANSPLANT [place] [facility] REGION) [address]  [place] [address]  [phone]   Dec 22, 2016 11:20 AM EDT  RETURN PHARMD with Ruth-Ann Maryln Manuel, CPP  [place] TRANSPLANT SURGERY [place]) [address]  [place] [address]  [phone]   Dec 22, 2016  1:15 PM EDT  RETURN 15 with Doyce Loose, MD  [facility] TRANSPLANT SURGERY [place] [facility]) [address]  [place] [address]  [phone]   Dec 24, 2016 To Be Determined  SLP - HOME VISIT with Maree Erie, SLP  [facility] AND HOSPICE [facility]) [address]  [place] [address]  [phone]   Dec 26, 2016 To Be Determined  OT - HOME VISIT with Graciella Freer, OT  [facility] AND HOSPICE [facility]) [address]  [place] [address]  [phone]   Dec 26, 2016 To Be Determined  SN - HOME VISIT with Milderd Meager, RN  [facility] HOSPICE [facility]) [address]  [place] [address]  [phone]   Dec 26, 2016  1:30 PM EDT  ELECTROMYOGRAPHY with EMG [facility])  [place] [facility] EEG [facility] REGION) [address]  [place] [address]  [phone]   Dec 27, 2016 To Be Determined  PT - HOME VISIT with Duanne Moron, PT  [facility] AND HOSPICE [facility]) [address]  [place] [address]  [phone]   Dec 27, 2016 To Be Determined  SLP - HOME VISIT with Gareth Morgan, SLP  [facility] AND HOSPICE [facility]) [address]  [place] [address]  [phone] Dec 28, 2016 To Be Determined  SN - HOME VISIT with Peterson Lombard, LPN  [facility] AND HOSPICE [facility]) [address]  [place] [address]  [phone]   Dec 28, 2016 To Be Determined  OT - HOME VISIT with Graciella Freer, OT  [facility] AND HOSPICE [facility]) [address]  [place] [address]  [phone]   Dec 29, 2016 To Be Determined  PT - HOME VISIT with Duanne Moron, PT  [facility] AND HOSPICE [facility]) [address]  [place] [address]  [phone]   Dec 29, 2016 To Be Determined  SLP - HOME VISIT with Gareth Morgan, SLP  [facility] AND HOSPICE [facility]) [address]  [place] [address]  [phone]   Jan 02, 2017 To Be Determined  SN - HOME VISIT with Peterson Lombard, LPN  [facility] AND HOSPICE [facility]) [address]  [place] [address]  [phone]   Jan 03, 2017 To Be Determined  PT - HOME VISIT with Duanne Moron, PT  [facility] AND HOSPICE [facility]) [address]  [place] [address]  [phone]   Jan 04, 2017 To Be Determined  SN - HOME VISIT with Peterson Lombard, LPN  [facility] AND HOSPICE [facility]) [address]  [place] [address]  [phone]   Jan 05, 2017 To Be Determined  PT - HOME VISIT with Duanne Moron, PT  [facility] AND HOSPICE [facility]) [address]  [place] [address]  [phone]   Jan 06, 2017 12:30 PM EDT  NEW PFT 30 with [place] PFT 4  [facility] PULMONARY SPECIALTY FUNCT [place] [facility] REGION) [address]  [place] [address] [phone]   Jan 06, 2017  1:30 PM EDT  NEW  PULM HYPERTENSION with Jettie Booze, MD  [facility] PULMONARY SPECIALTY CL [place] [facility] REGION) [address]  [place] [address]  [phone]   Jan 09, 2017 To Be Determined  SN - HOME VISIT with Peterson Lombard, LPN  [facility] AND HOSPICE [facility]) [address]  [place] [address]  [phone]   Jan 09, 2017 To Be Determined  SN - HOME VISIT with Milderd Meager, RN  [facility] HOSP[facility]) [address]  [place] [address]  [phone]   Jan 10, 2017  9:20 AM EDT  RETURN  GENERAL with Idelle Crouch, MD  [facility] PHYSICAL MEDICINE [facility] [address] [place] [facility]) [address]  [place] [address]  [phone]   Jan 11, 2017 To Be Determined  PT - HOME VISIT with Duanne Moron, PT  [facility] AND HOSPICE [facility]) [address]  [place] [address]  [phone]   Jan 13, 2017 To Be Determined  PT -  HOME VISIT with Duanne Moron, PT  [facility] AND HOSPICE [facility]) [address]  [place] [address]  [phone]   Jan 16, 2017 To Be Determined  SN - HOME VISIT with Peterson Lombard, LPN  [facility] AND HOSPICE [facility]) [address]  [place] [address]  [phone]   Jan 17, 2017 To Be Determined  PT - HOME VISIT with Duanne Moron, PT  [facility] AND HOSPICE [facility]) [address]  [place] [address]  [phone]   Jan 19, 2017 To Be Determined  PT - HOME VISIT with Duanne Moron, PT  [facility] AND HOSPICE [facility]) [address]  [place] [address]  [phone]   Jan 23, 2017 12:00 AM EST  AT HOME PACEMAKER CHECK with [facility] EP REMOTE MONITORING  [facility] EP REMOTE MONITORING [place] [facility] REGION) [address]  2nd Floor [place] [address]  [phone]   Jan 23, 2017 To Be Determined  SN - HOME VISIT with Milderd Meager, RN  [facility] AND HOSPICE [facility]) [address]  [place] [address]  [phone]   Jan 30, 2017 To Be Determined  SN - HOME VISIT with Milderd Meager, RN  [facility] AND HOSPICE [facility]) [address]  [place] [address]  [phone]   Apr 24, 2017 12:00 AM EST  AT HOME PACEMAKER CHECK with [facility] EP REMOTE MONITORING  [facility] EP REMOTE MONITORING [facility] REGION) [address] Floor [place] [address]  [phone]   Jul 24, 2017 12:00 AM EDT  AT HOME PACEMAKER CHECK with [facility] EP REMOTE MONITORING  [facility] EP REMOTE MONITORING [facility] REGION) [address]  2nd Floor [place] [address]  [phone]

## 2016-12-20 NOTE — Unmapped (Signed)
TRANSPLANT SURGERY PROGRESS NOTE  Hospital Day: 2      Assessment   66 y.o. 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 Admitted 12/19/16 for increased LFT's and poor PO intake. LFT's are now decreased without intervention.    Plan   Neuro: No pain med requirements    CV: HDS  Atrial fibrillation/narrow complex tachycardia  - warfarin, amiodarone, digoxin    Pulmonary: Stable on room air  Pulmonary hypertension  - sildenafil    FEN/GI: 0.9NS at 50 mL/hr.   - NPO  - No JP drain.  - replete magnesium  Elevated LFT's  - LFT's decreased today  - NPO overnight for liver ultrasound with doppler  - F/u results of liver US  Immunosuppression  - cyclosporin, mycophenolate  - will monitor neutropenia    Renal/GU: UOP adequate  - Voiding spontaneously    Heme/ID: Afebrile  - H/H stable    T/L/D: PIV    Ppx: OOB, IS    Dispo: Floor  - Possible D/C home pending results of liver US    Nicholos Johns, MD  General Surgery, PGY-1  7:44 AM 12/20/16  [id number]      Interval Events   No acute events overnight. Pain well controlled. Vital signs were stable. Adequate urine output.      Objective   Vital signs in last 24 hours:  BP 159/84  - Pulse 75  - Temp 36.8 ??C (Oral)  - Resp 18  - Ht 172.7 cm (5' 7.99)  - Wt 63 kg (139 lb)  - SpO2 95%  - BMI 21.14 kg/m??     Intake/Output last 24 hours:  I/O last 3 completed shifts:  In: 938.3 [P.O.:360; I.V.:458.3; NG/GT:120]  Out: 910 [Urine:910]  No intake/output data recorded.    Physical Exam:  General Appearance:         No acute distress, well appearing and well nourished  Cardiovascular:                  Regular rate and rhythm, no murmur or thrill  Abdomen:                         Soft, non-tender, non distended, incision well-healed  Musculoskeletal:                Extremities without clubbing, cyanosis, or edema  Skin: Skin color, texture, turgor normal, no rashes or lesions, no jaundice    Data Review:    Lab Results   Component Value Date    WBC 1.7 (L) 12/20/2016    HGB 10.3 (L) 12/20/2016    HCT 31.0 (L) 12/20/2016    PLT 81 (L) 12/20/2016       Lab Results   Component Value Date    NA 134 (L) 12/20/2016    K 4.2 12/20/2016    CL 103 12/20/2016    CO2 23.0 12/20/2016    BUN 20 12/20/2016    CREATININE 1.12 12/20/2016    CALCIUM 8.9 12/20/2016    MG 1.3 (L) 12/20/2016    PHOS 3.0 12/20/2016

## 2016-12-20 NOTE — Unmapped (Signed)
Problem: Patient Care Overview  Goal: Plan of Care Review  Outcome: Discharged to Home   12/20/16 0055   OTHER   Plan of Care Reviewed With patient   Plan of Care Review   Progress improving       Comments: [facility] per MD order.  Patient has had discharge instructions explained to them and patient has signed Discharge Instructions to confirm receipt of instructions.  Patient verbalizes understanding of all discharge instructions prior to discharge from the hospital.  Patient in no apparent distress, at this time, prior to leaving the hospital.

## 2016-12-20 NOTE — Unmapped (Signed)
Adult Nutrition Consult     Visit Type: RN Consult via Interdisciplinary Screening and Assessment Form. Identifiers: Home enteral/parenteral nutrition (TF/TPN)  Reason for Visit:  Assessment and Enteral Nutrition     ASSESSMENT:   HPI & PMH: Pt is a 66 yo male s/p OLT on 09/15/16, recent discharge from rehab on 9/29, who presented as direct admit on 10/8 for rising LFTs and poor PO intake    Nutrition Hx: Pt and wife report good appetite & intake at home, weight maintenance around ~140 lbs. Typically consumes large breakfast (eggs, bacon, toast) and dinner (meat, vegetable, starch) with smaller lunch (soup, pimento cheese sandwich). Nausea with tube feeds if bolus administered after a full meal.     Amenable to modifying EN regimen to PRN. If pt does not eat at least ~50% of a meal, he will supplement with 1 can of Osmolite 1.5 via PEG or drink 1 Ensure Plus/Carnation Instant Breakfast.     Nutritionally Pertinent Meds: Reviewed; mag-ox, vitamin D3, insulin regimen, thiamine  Labs: Reviewed; Na 134, Mg 1.3, glucose POC 148-215 x 24 hrs  Abd/GI: no documented BMs since admission  Skin: see below  Patient Lines/Drains/Airways Status    Active Wounds     None               Current nutrition therapy order:   Nutrition Orders          NPO Strict; Medically necessary; Stop Tube Feeding starting at 10/09 0631    Adult Enteral Nutrition Osmolite 1.5 (Standard 1.5 Cal) starting at 10/08 1754         Anthropometric Data:  -- Height: 172.7 cm (5' 7.99)   -- Last recorded weight: 63 kg (139 lb)  -- Admission weight: 63 kg  -- IBW: 69.9 kg  -- Percent IBW: ~90%  -- BMI: Body mass index is 21.14 kg/m??.   -- Weight changes this admission:   Last 5 Recorded Weights    12/19/16 1820   Weight: 63 kg (139 lb)      -- Weight history PTA: per EPIC wt hx, pt has experienced ~7% wt loss x ~1 month; significant. However, wife reports his UBW is between 138-140 lbs. 152 likely reflective of fluid status.  Wt Readings from Last 10 Encounters: 12/19/16 63 kg (139 lb)   12/16/16 63 kg (139 lb)   12/15/16 63.5 kg (140 lb)   12/12/16 63.5 kg (140 lb)   12/02/16 69 kg (152 lb 1.9 oz)   11/29/16 68 kg (150 lb)   11/22/16 67.8 kg (149 lb 7.6 oz)   10/08/16 73.5 kg (162 lb 0.6 oz)   09/06/16 75.2 kg (165 lb 12.6 oz)   08/02/16 71.1 kg (156 lb 12.8 oz)      Daily Estimated Nutrient Needs:   Energy: 1890-2205 kcals [30-35 kcal/kg using admission body weight, 63 kg (12/20/16 0847)]  Protein: 76-95 gm [1.2-1.5 gm/kg using admission body weight, 63 kg (12/20/16 0847)]  Carbohydrate:   [< / equal to 45% of kcal]  Fluid:   [per MD team]     Nutrition Focused Physical Exam: per NFPE completed during previous admission. Findings remain unchanged 2/2 no notable weight gain.    Fat Areas Examined  Orbital: Mild/moderate loss  Upper Arm: Mild/moderate loss                        ??  Muscle Areas Examined  Temple: Severe loss  Clavicle: Severe loss  Acromion: Severe loss  Scapular: Severe loss  Anterior Thigh: Mild/moderate loss  Posterior Calf: Mild/moderate loss  ??  ??  ??  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 However, suspect his nutritional status has improved since discharge from [facility].    Overall nutrition impression: pt continues to benefit from tube feeds and oral nutrition supplements on a PRN basis. He and his wife report good appetite & intake, consuming 100% of meals at home. Monitoring daily weights. Reiterated the importance of adequate nutrition for healing; pt expressed understanding. Amenable to administering one can of Osmolite 1.5 via PEG or drinking 1 Ensure Plus/Carnation Instant Breakfast if unable to eat >50% of meal.      GOALS:  Oral Intake:       - PO diet will be advanced  Enteral Nutrition:       - Patient to receive TF boluses as needed  Anthropometric:       - Prevent unintentional wt loss     RECOMMENDATIONS AND INTERVENTIONS:  Monitor NPO status, medical nutrition therapy advancement and tolerance  Vitamin and mineral supplementation of MVI w/ minerals  Request twice weekly weight(s)   Order Ensure Plus, Carnation Instant Breakfast BID once PO diet advanced  Suggest modifying EN regimen to PRN as pt is refusing scheduled   Administer 1 can of Osmolite 1.5 if pt does not  consume >50% of provided meal. Maximum 5  cans daily.  Request twice weekly weights     RD Follow Up Parameters:  1-2 times per week (and more frequent as indicated)     Jackqulyn Livings MPH, RD, LDN  Pager: [phone]

## 2016-12-20 NOTE — Unmapped (Signed)
Transitions to Home Social Worker note:    Per chart review, this [place] [facility] Readmission [facility] pt was readmitted 12/19/16 to [facility].    TSW sent in-basket to [facility] team leads Janith Lima and Arman Filter per protocol.  TSW tasked and paged Physician Advisor per protocol.     Chart review utilized to complete readmission assessment (below), as pt is preparing for discharge.      Transitions Social Work Readmission Assessment     How do you think you became sick enough to come back to the hospital?  - Per chart, pt was not feeling well and vomiting. Team had concerns about labs and inability to meet caloric needs.      Did you see your doctor or doctor's RN in the office before you came back to the hospital?  - Per chart, wife spoke w/ Transplant RNs via phone    If yes, which doctor (PCP or Specialist) did you see: Transplant  If no, why not? n/a     Has anything gotten in the way of you getting to:  Office visit: n/a  Take your medications: - Per chart, wife spoke w/ Transplant RNs via phone regarding medication questions   Following your discharge instructions: n/a      Anything else you want to share related to being sick enough to return to the hospital:  - Per chart, [facility] RN [facility] was completed on 12/11/16    Janeann Merl, LCSW  Transitions to Home Program  December 20, 2016 3:57 PM

## 2016-12-20 NOTE — Unmapped (Signed)
Surgery History and Physical    Assessment/Plan:  Jeffrey Ward is a 66 y.o. 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    - Admit to regular floor  - Regular diet as tolerated, restart home TF, mIVF at 50cc/hr  - Liver ultrasound with doppler  - Routine labs in AM  - Home meds ordered    History of Present Illness:  Jeffrey Ward is a 30M with IDDM, GERD, prior perioperative a-fib with RVR and 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 Post-op course complicated by decompensation secondary to acute right heart failure,??AKI requiring CRRT (resolved), severe pulmonary hypertension responsive to sildenafil, bradycardia/ asystole requiring pacemaker placement, atrial fibrilliation/ narrow complex tachycardia requiring anti-coagulation with warfarin and amiodarone/ digoxin, malnutrition/ debility, and altered mental status/ delirium improved with transition to cyclosporine and psychiatric medications. He was discharged to [facility] 9/13 and discharged to home 12/10/16.    His labs on 12/19/16 showed rising LFT's, with an AST of 157 from 30 and an ALT of 118 from 35. He was also having poor PO intake with a couple episodes of emesis. He is being direct admitted for liver ultrasound with doppler to evaluate patency of liver vasculature.     He also c/o chronic back pain with radiation down his both posterior thighs.       Allergies  Patient has no known allergies.    Medications    No current facility-administered medications for this encounter.      Current Outpatient Prescriptions   Medication Sig Dispense Refill   ??? acetaminophen (TYLENOL) 160 mg/5 mL solution Take 10.2 mL (325 mg total) by mouth every four (4) hours as needed for pain. 300 mL 0   ??? amiodarone (PACERONE) 200 MG tablet 1 tablet (200 mg total) by G-tube route daily. 30 tablet 11   ??? aspirin 81 MG chewable tablet 1 tablet (81 mg total) by G-tube route daily. 30 tablet 0   ??? blood sugar diagnostic (FREESTYLE TEST) Strp by Other route Four (4) times a day (before meals and nightly). 100 each 11   ??? blood-glucose meter (GLUCOSE MONITORING KIT) kit Use as instructed 1 each 0   ??? cholecalciferol, vitamin D3, 2,000 unit cap 1 capsule (2,000 Units total) by PEG Tube route daily. 30 capsule 11   ??? cycloSPORINE (NEORAL) 100 mg/mL microemulsion solution 2.3 mL (230 mg total) by G-tube route Two (2) times a day. 150 mL 11   ??? digoxin (LANOXIN) 125 mcg tablet 0.5 tablets (62.5 mcg total) by G-tube route daily. 15 tablet 11   ??? docusate (COLACE) 50 mg/5 mL liquid 10 mL (100 mg total) by G-tube route two (2) times a day as needed. 600 mL 0   ??? insulin ASPART (NOVOLOG FLEXPEN U-100 INSULIN) 100 unit/mL injection pen Inject 0.04 mL (4 Units total) under the skin Three (3) times a day before meals. 4 mL 11   ??? insulin ASPART (NOVOLOG FLEXPEN U-100 INSULIN) 100 unit/mL injection pen Inject 0-0.12 mL (0-12 Units total) under the skin Four (4) times a day (before meals and nightly). Sliding scale. 14.4 mL 0   ??? insulin glargine (LANTUS) 100 unit/mL (3 mL) injection pen Inject 0.12 mL (12 Units total) under the skin nightly. 4 mL 11   ??? lancets Misc 1 each by Miscellaneous route Four (4) times a day (before meals and nightly). 100 each 11   ??? melatonin  3 mg Tab 1-2 tablets (3-6 mg total) by PEG Tube route nightly. 60 tablet 0   ??? mycophenolate (CELLCEPT) 200 mg/mL suspension 2.5 mL (500 mg total) by G-tube route Two (2) times a day (8am and 8pm). 175 mL 12   ??? pen needle, diabetic (ULTICARE PEN NEEDLE) 32 gauge x 5/32 Ndle 1 each by Miscellaneous route Four (4) times a day (before meals and nightly). 90 each 3   ??? polyethylene glycol (MIRALAX) 17 gram packet 17 g by G-tube route daily as needed. 24 each 0   ??? sildenafil, antihypertensive, (REVATIO) 20 mg tablet 1.5 tablets (30 mg total) by G-tube route Three (3) times a day. Dissolve 1 1/2 tablets in water and administer via PEG tube. 135 tablet 0   ??? sulfamethoxazole-trimethoprim (BACTRIM,SEPTRA) 200-40 mg/5 mL suspension 10 mL by G-tube route 3 (three) times a week. 360 mL 0   ??? thiamine (B-1) 100 MG tablet 1 tablet (100 mg total) by G-tube route daily. 30 tablet 11   ??? valGANciclovir (VALCYTE) 50 mg/mL SolR 9 mL (450 mg total) by PEG Tube route daily. 270 mL 2   ??? warfarin (COUMADIN) 1 MG tablet 4 mg with dinner Take one 3 mg pill and one 1 mg pill. 30 tablet 11   ??? warfarin (COUMADIN) 3 MG tablet 1 tablet (3 mg total) by G-tube route daily with evening meal. 30 tablet 11       Past Medical History  Past Medical History:   Diagnosis Date   ??? Cancer (CMS-HCC)    ??? Cirrhosis (CMS-HCC)    ??? Diabetes (CMS-HCC)    ??? Hepatitis C 07/17/2012   ??? Liver disease    ??? Low back pain 07/17/2012   ??? Varices, esophageal (CMS-HCC)        Past Surgical History  Past Surgical History:   Procedure Laterality Date   ??? ANKLE SURGERY     ??? BACK SURGERY     ??? BACK SURGERY     ??? CHG US GUIDE, TISSUE ABLATION N/A 01/22/2016    Procedure: ULTRASOUND GUIDANCE FOR, AND MONITORING OF, PARENCHYMAL TISSUE ABLATION;  Surgeon: Particia Nearing, MD;  Location: MAIN OR [facility];  Service: Transplant   ??? IR EMBOLIZATION ORGAN ISCHEMIA, TUMORS, INFAR  06/16/2016    IR EMBOLIZATION ORGAN ISCHEMIA, TUMORS, INFAR 06/16/2016 Ammie Dalton, MD IMG VIR [facility]   ??? PR COLON CA SCRN NOT HI RSK IND N/A 02/27/2015    Procedure: COLOREC CNCR SCR;COLNSCPY NO;  Surgeon: Vonda Antigua, MD;  Location: GI PROCEDURES [facility];  Service: Gastroenterology   ??? PR ENDOSCOPIC ULTRASOUND EXAM N/A 02/27/2015    Procedure: UGI ENDO; W/ENDO ULTRASOUND EXAM INCLUDES ESOPHAGUS, STOMACH, &/OR DUODENUM/JEJUNUM;  Surgeon: Vonda Antigua, MD;  Location: GI PROC[facility]URES [facility];  Service: Gastroenterology   ??? PR ERCP STENT PLACEMENT BILIARY/PANCREATIC DUCT N/A 11/29/2016    Procedure: ENDOSCOPIC RETROGRADE CHOLANGIOPANCREATOGRAPHY (ERCP); WITH PLACEMENT OF ENDOSCOPIC STENT INTO BILIARY OR PANCREATIC DUCT;  Surgeon: Chriss Driver, MD;  Location: GI PROCEDURES MEMORIAL [facility];  Service: Gastroenterology   ??? PR ERCP,W/REMOVAL STONE,BIL/PANCR DUCTS N/A 11/29/2016    Procedure: ERCP; W/ENDOSCOPIC RETROGRADE REMOVAL OF CALCULUS/CALCULI FROM BILIARY &/OR PANCREATIC DUCTS;  Surgeon: Chriss Driver, MD;  Location: GI PROCEDURES [facility];  Service: Gastroenterology   ??? PR INSER HEART TEMP PACER ONE CHMBR N/A 10/02/2016    Procedure: Tempoarary Pacemaker Insertion;  Surgeon: Meredith Leeds, MD;  Location: [facility];  Service: Cardiology   ??? PR LAP,DIAGNOSTIC  ABDOMEN N/A 01/22/2016    Procedure: Laparoscopy, Abdomen, Peritoneum, & Omentum, Diagnostic, W/Wo Collection Specimen(S) By Brushing Or Washing;  Surgeon: Particia Nearing, MD;  Location: MAIN OR [facility];  Service: Transplant   ??? PR PLACE PERCUT GASTROSTOMY TUBE N/A 11/17/2016    Procedure: UGI ENDO; W/DIRECTED PLCMT PERQ GASTROSTOMY TUBE;  Surgeon: Cletis Athens, MD;  Location: GI PROCEDURES MEMORIAL [facility];  Service: Gastroenterology   ??? PR TRACHEOSTOMY, PLANNED N/A 09/29/2016    Procedure: TRACHEOSTOMY PLANNED (SEPART PROC);  Surgeon: Katherina Mires, MD;  Location: MAIN OR [facility];  Service: Trauma   ??? PR TRANSCATH INSERT OR REPLACE LEADLESS PM VENTR N/A 10/11/2016    Procedure: Pacemaker Implant/Replace Leadless;  Surgeon: Meredith Leeds, MD;  Location: [facility];  Service: Cardiology   ??? PR TRANSPLANT LIVER,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    Procedure: Liver Allotransplantation; Orthotopic, Partial Or Whole, From Cadaver Or Living Donor, Any Age;  Surgeon: Doyce Loose, MD;  Location: MAIN OR [facility];  Service: Transplant   ??? PR TRANSPLANT,PREP DONOR LIVER, WHOLE N/A 09/15/2016    Procedure: Rogelia Boga Std Prep Cad Donor Whole Liver Gft Prior Tnsplnt,Inc Chole,Diss/Rem Surr Tissu Wo Triseg/Lobe Splt;  Surgeon: Doyce Loose, MD;  Location: MAIN OR [facility];  Service: Transplant   ??? PR UPPER GI ENDOSCOPY,BIOPSY N/A 07/17/2012    Procedure: UGI ENDOSCOPY; WITH BIOPSY, SINGLE OR MULTIPLE;  Surgeon: Alba Destine, MD;  Location: GI PROCEDURES [facility]OR[facility];  Service: Gastroenterology   ??? PR UPPER GI ENDOSCOPY,DIAGNOSIS N/A 02/04/2014    Procedure: UGI ENDO, INCLUDE ESOPHAGUS, STOMACH, & DUODENUM &/OR JEJUNUM; DX W/WO COLLECTION SPECIMN, BY BRUSH OR WASH;  Surgeon: Wilburt Finlay, MD;  Location: GI PROCEDURES [facility];  Service: Gastroenterology   ??? PR UPPER GI ENDOSCOPY,LIGAT VARIX N/A 11/05/2013    Procedure: UGI ENDO; W/BAND LIG ESOPH &/OR GASTRIC VARICES;  Surgeon: Wilburt Finlay, MD;  Location: GI PROCEDURES [facility];  Service: Gastroenterology       Family History  The patient's family history includes Hypertension in his mother..    Social History:  Social History     Social History   ??? Marital status: Married     Spouse name: N/A   ??? Number of children: N/A   ??? Years of education: N/A     Occupational History   ??? Not on file.     Social History Main Topics   ??? Smoking status: Never Smoker   ??? Smokeless tobacco: Never Used      Comment: Smoked in high school for about 5 years.    ??? Alcohol use No   ??? Drug use: No   ??? Sexual activity: Yes     Partners: Female     Birth control/ protection: None     Other Topics Concern   ??? Not on file     Social History Narrative   ??? No narrative on file       Review of Systems  A 12 system review of systems was negative except as noted in HPI    Objective:     Vital Signs  There were no vitals filed for this visit.    Physical Exam  General Appearance:  No acute distress, well appearing and well nourished.   Head:  Normocephalic, atraumatic.   Eyes:  Conjuctiva and lids appear normal. Pupils equal and round,   sclera anicteric.   Ears:  Overall appearance normal with no scars, lesions or  masses.  Hearing is grossly normal.   Nose: Nares grossly normal, no drainage.   Throat: Lips, mucosa, and tongue normal; teeth and gums normal.   Neck: Supple, symmetrical, trachea midline, no adenopathy;   thyroid:  no enlargement/tenderness/nodules.   Pulmonary:    Normal respiratory effort.  Lungs were clear to auscultation  bilaterally.   Cardiovascular:  Regular rate and rhythm, no murmur noted.  No thrill noted.   Abdomen:   Soft, non-tender, without masses.  No                                      hepatosplenomegaly. No hernias appreciated. J-tube is functioning.   Musculoskeletal: Normal gait.  Extremities without clubbing, cyanosis, or           edema.   Skin: Skin color, texture, turgor normal, no rashes or lesions.   Neurologic: No motor abnormalities noted.  Sensation grossly intact.   Psychiatric: Judgement and insight appropriate.  Oriented to person,          place,  and time.         Test Results  All lab results last 24 hours:    Recent Results (from the past 24 hour(s))   Comprehensive Metabolic Panel    Collection Time: 12/19/16 10:00 AM   Result Value Ref Range    Sodium 136 135 - 145 mmol/L    Potassium 4.4 3.5 - 5.0 mmol/L    Chloride 103 98 - 107 mmol/L    CO2 26.0 22.0 - 30.0 mmol/L    BUN 23 (H) 7 - 21 mg/dL    Creatinine 1.61 0.96 - 1.30 mg/dL    BUN/Creatinine Ratio 19     EGFR MDRD Non Af Amer 58 (L) >=60 mL/min/1.4m2    EGFR MDRD Af Amer >=60 >=60 mL/min/1.70m2    Anion Gap 7 (L) 9 - 15 mmol/L    Glucose 67 65 - 179 mg/dL    Calcium 9.5 8.5 - 04.5 mg/dL    Albumin 3.8 3.5 - 5.0 g/dL    Total Protein 6.2 (L) 6.5 - 8.3 g/dL    Total Bilirubin 1.3 (H) 0.0 - 1.2 mg/dL    AST 409 (H) 19 - 55 U/L    ALT 118 (H) 19 - 72 U/L    Alkaline Phosphatase 347 (H) 38 - 126 U/L   Digoxin level    Collection Time: 12/19/16 10:00 AM   Result Value Ref Range    Digoxin Level 0.7 (L) 0.8 - 1.8 ng/mL   Magnesium Level    Collection Time: 12/19/16 10:00 AM   Result Value Ref Range    Magnesium 1.5 (L) 1.6 - 2.2 mg/dL   Phosphorus Level    Collection Time: 12/19/16 10:00 AM   Result Value Ref Range    Phosphorus 3.6 2.9 - 4.7 mg/dL   PT-INR    Collection Time: 12/19/16 10:00 AM   Result Value Ref Range    PT 25.6 (H) 10.2 - 12.8 sec    INR 2.25    Bilirubin, Direct    Collection Time: 12/19/16 10:00 AM   Result Value Ref Range    Bilirubin, Direct 0.70 (H) 0.00 - 0.40 mg/dL   CBC w/ Differential    Collection Time: 12/19/16 10:00 AM   Result Value Ref Range    WBC 1.8 (L) 4.5 - 11.0 10*9/L    RBC 3.52 (L) 4.50 -  5.90 10*12/L    HGB 11.1 (L) 13.5 - 17.5 g/dL    HCT 16.1 (L) 09.6 - 53.0 %    MCV 95.2 80.0 - 100.0 fL    MCH 31.7 26.0 - 34.0 pg    MCHC 33.3 31.0 - 37.0 g/dL    RDW 04.5 (H) 40.9 - 15.0 %    MPV 8.0 7.0 - 10.0 fL    Platelet 80 (L) 150 - 440 10*9/L    Variable HGB Concentration Slight (A) Not Present    Absolute Neutrophils 1.1 (L) 2.0 - 7.5 10*9/L    Absolute Lymphocytes 0.5 (L) 1.5 - 5.0 10*9/L    Absolute Monocytes 0.1 (L) 0.2 - 0.8 10*9/L    Absolute Eosinophils 0.0 0.0 - 0.4 10*9/L    Absolute Basophils 0.0 0.0 - 0.1 10*9/L    Large Unstained Cells 1 0 - 4 %    Macrocytosis Moderate (A) Not Present    Anisocytosis Moderate (A) Not Present   POCT Glucose    Collection Time: 12/19/16  6:44 PM   Result Value Ref Range    Glucose, POC 186 (H) 65 - 179 mg/dL   POCT Glucose    Collection Time: 12/19/16  8:52 PM   Result Value Ref Range    Glucose, POC 148 65 - 179 mg/dL       Imaging:   No results found.

## 2016-12-20 NOTE — Unmapped (Signed)
Met with pt and pt's wife, Jeffrey Ward this morning. Pt sister in law also visiting. Reviewed plan of care from rounds with them-waiting on liver ultrasound report and dietician to consult with patient today. Pt mentioned he had small fall at home prior to coming in and right shoulder/side a bit more sorer today and requesting pain meds. Notified nurse in CAPP rounds as well as team to assess.   Caryl Ada Inpatient Transplant Nurse Cordinator 12/20/2016 1:28 PM

## 2016-12-20 NOTE — Unmapped (Signed)
Care Management  Initial Transition Planning Assessment      Per H&P, Jushua Waltman is a 66 y.o. 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  ??  Per CAPP rounds, pt is undergoing liver US. Pt is followed by transplant SW.    Triggers for social work consultation identified on admission.  Social worker consulted for full psychosocial and discharge needs assessment.  CM will continue to follow for avoidable delays and opportunities for progression of care.                General  Care Manager assessed the patient by : Discussion with Clinical Care team, Medical record review     Financial Information:          Need for financial assistance?: Other (Comment) (defer to SW)       Discharge Needs Assessment:    Concerns to be Addressed:      Clinical Risk Factors: Other (Comment), > 65, Multiple Diagnoses (Chronic), Readmission < 30 Days    Barriers to taking medications: No    Prior overnight hospital stay or ED visit in last 90 days: Yes                        Discharge Facility/Level of Care Needs:           Discharge Plan:         Expected Discharge Date: 12/22/16    Expected Transfer from Critical Care:      Lauris Chroman, LCSW; Pager [phone].   For CM assistance during the weekend, please page [phone].  December 20, 2016 4:02 PM

## 2016-12-20 NOTE — Unmapped (Signed)
Order was placed for PIV by Venous Access Team (VAT).  Patient was assessed for placement of a PIV. Access was obtained. Blood return noted.  Dressing intact and device well secured.  Flushed with normal saline.  Pt advised to inform RN of any s/s of discomfort at the PIV site.    Workup / Procedure Time:  30 minutes      The primary RN was notified.       Thank you,     Thelma Barge RN Venous Access Team

## 2016-12-20 NOTE — Unmapped (Signed)
Problem: Patient Care Overview  Goal: Plan of Care Review  Outcome: Progressing   12/20/16 0055   OTHER   Plan of Care Reviewed With patient   Plan of Care Review   Progress improving   Pt given poc update, meds reviewed, pain assessment noted with prn meds as needed given, pt assessments competed see flow sheets, pt dc plans as known at this time reviewed with pt and family, all questions answered.      Problem: Fall Risk (Adult)  Goal: Absence of Fall  Patient will demonstrate the desired outcomes by discharge/transition of care.   Outcome: Progressing      Problem: Self-Care Deficit (Adult,Obstetrics,Pediatric)  Goal: Improved Ability to Perform BADL and IADL  Patient will demonstrate the desired outcomes by discharge/transition of care.   Outcome: Progressing      Problem: Pain, Acute (Adult)  Goal: Acceptable Pain Control/Comfort Level  Patient will demonstrate the desired outcomes by discharge/transition of care.  Outcome: Progressing      Problem: VTE, DVT and PE (Adult)  Goal: Signs and Symptoms of Listed Potential Problems Will be Absent, Minimized or Managed (VTE, DVT and PE)  Signs and symptoms of listed potential problems will be absent, minimized or managed by discharge/transition of care (reference VTE, DVT and PE (Adult) CPG).  Outcome: Progressing      Problem: Infection, Risk/Actual (Adult)  Goal: Infection Prevention/Resolution  Patient will demonstrate the desired outcomes by discharge/transition of care.  Outcome: Progressing

## 2016-12-20 NOTE — Unmapped (Deleted)
Discharge Summary    Admit date: 12/19/2016    Discharge date and time: 12/20/16 3:58 PM    Discharge to:  Home    Discharge Service: Surg Transplant [facility])    Discharge Attending Physician: Particia Nearing, MD    Discharge  Diagnoses: transaminitis    OR Procedures:  None     Ancillary Procedures: no procedures    Discharge Day Services:     Subjective   No acute events overnight. Pain Controlled. No fever or chills.    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 Post-op course complicated by decompensation secondary to acute right heart failure,??AKI requiring CRRT (resolved), severe pulmonary hypertension responsive to sildenafil, bradycardia/ asystole requiring pacemaker placement, atrial fibrilliation/ narrow complex tachycardia requiring anti-coagulation with warfarin and amiodarone/ digoxin, malnutrition/ debility, and altered mental status/ delirium improved with transition to cyclosporine and psychiatric medications. He was discharged to [facility] 9/13 and discharged to home 12/10/16.     He was readmitted 12/19/16 for increased LFT's and poor PO intake. A liver US was performed and showed some biliary sludge, but was otherwise WNL. His LFTs began to downtrend on hospital day 1. He tolerated a regular diet. The nutritionist was consulted to review diet and supplementation regimen with him. He was deemed stable for discharge with close outpatient follow-up on 10/9 and discharged to home.       Condition at Discharge: Improved  Discharge Medications:      Medication List      START taking these medications    magnesium oxide 400 mg (241.3 mg magnesium) tablet  Commonly known as:  MAG-OX  1 tablet (400 mg total) by G-tube route Two (2) times a day.     traMADol 50 mg tablet  Commonly known as:  ULTRAM  Take 1-2 tablets (50-100 mg total) by mouth every six (6) hours as needed.   for up to 5 days        CONTINUE taking these medications    acetaminophen 160 mg/5 mL solution  Commonly known as:  TYLENOL  Take 10.2 mL (325 mg total) by mouth every four (4) hours as needed for   pain.     amiodarone 200 MG tablet  Commonly known as:  PACERONE  1 tablet (200 mg total) by G-tube route daily.     aspirin 81 MG chewable tablet  1 tablet (81 mg  total) by G-tube route daily.     blood sugar diagnostic Strp  Commonly known as:  FREESTYLE TEST  by Other route Four (4) times a day (before meals and nightly).     blood-glucose meter kit  Commonly known as:  glucose monitoring kit  Use as instructed     cholecalciferol (vitamin D3) 2,000 unit Cap  1 capsule (2,000 Units total) by PEG Tube route daily.     cycloSPORINE 100 mg/mL microemulsion solution  Commonly known as:  NEORAL  2.3 mL (230 mg total) by G-tube route Two (2) times a day.     digoxin 125 mcg tablet  Commonly known as:  LANOXIN  0.5 tablets (62.5 mcg total) by G-tube route daily.     docusate 50 mg/5 mL liquid  Commonly known as:  COLACE  10 mL (100 mg total) by G-tube route two (2) times a day as needed.     * insulin ASPART 100 unit/mL injection pen  Commonly known as:  NovoLOG Flexpen U-100 Insulin  Inject 0.04 mL (4 Units total) under the skin Three (3) times a day before   meals.     * insulin ASPART 100 unit/mL injection pen  Commonly known as:  NovoLOG Flexpen U-100 Insulin  Inject 0-0.12 mL (0-12 Units total) under the skin Four (4) times a day   (before meals and nightly). Sliding scale.     insulin glargine 100 unit/mL (3 mL) injection pen  Commonly known as:  LANTUS  Inject 0.12 mL (12 Units total) under the skin nightly.     lancets Misc  1 each by Miscellaneous route Four (4) times a day (before meals and   nightly).     melatonin 3 mg Tab  1-2 tablets (3-6 mg total) by PEG Tube route nightly.     mycophenolate 200 mg/mL suspension  Commonly known as:  CELLCEPT  2.5 mL (500 mg total) by G-tube route Two (2) times a day (8am and 8pm).     pen needle, diabetic 32 gauge x 5/32 Ndle  Commonly known as:  ULTICARE PEN NEEDLE  1 each by Miscellaneous route Four (4) times a day (before meals and   nightly).     polyethylene glycol 17 gram packet  Commonly known as:  MIRALAX  17 g by G-tube route daily as needed.     sildenafil (antihypertensive) 20 mg tablet  Commonly known as:  REVATIO  1.5 tablets (30 mg total) by G-tube route Three (3) times a day. Dissolve   1 1/2 tablets in water and administer via PEG tube.     sulfamethoxazole-trimethoprim 200-40 mg/5 mL suspension  Commonly known as:  BACTRIM,SEPTRA  10 mL by G-tube route 3 (three) times a week.     thiamine 100 MG tablet  Commonly known as:  B-1  1 tablet (100 mg total) by G-tube route daily.     valGANciclovir 50 mg/mL Solr  Commonly known as:  VALCYTE  9 mL (450 mg total) by PEG Tube route daily.     * warfarin 3 MG tablet  Commonly known as:  COUMADIN  1 tablet (3 mg total) by G-tube route daily with evening meal.     * warfarin 1 MG tablet  Commonly known as:  COUMADIN  4 mg with dinner Take one 3 mg pill and one 1 mg pill.        * This list has 4 medication(s) that are the same as other medications   prescribed  for you. Read the directions carefully, and ask your doctor or   other care provider to review them with you.              Pending Test Results:     Discharge Instructions:  Activity:     Diet:    Other Instructions:    Labs and Other Follow-ups after Discharge:  Follow Up instructions and Outpatient Referrals     Discharge instructions       Activity: Do not lift > 10-15 lbs for 1st 6 weeks, then gradually increase to 25 lbs over the following 6 weeks. Resume heavy lifting only after being cleared to do so at follow-up appointment in Transplant Surgery clinic.    Diet: Regular diet. Please give yourself a bolus tube feed (250 cc) if eating <50% of your meals.     Other Instructions:     Your Building services engineer is Health and safety inspector. Contact your transplant coordinator or the Transplant Surgery Office [phone]) during business hours or page the transplant coordinator on call [phone]) after business hours for:    - fever >100.5 degrees F by mouth, any fever with shaking chills, or other signs or symptoms of infection   - uncontrolled nausea, vomiting, or diarrhea; inability to have a bowel movement for > 3 days.   - any problem that prevents taking medications as scheduled.   - pain uncontrolled with prescribed medication or new pain or tenderness at the surgical site   - sudden weight gain or increase in blood pressure (greater than 140/85)   - shortness of breath, chest pain / discomfort   - new or increasing jaundice   - urinary symptoms including pain / difficulty / burning or tea-colored urine   - any other new or concerning symptoms   - questions regarding your medications or continuing care      Patient may shower.    Inspect surgical sites at least twice daily, contact Transplant Coordinator for spreading redness, purulent discharge, or increasing bleeding or drainage, or for separation of wounds.     Maintain a written record of daily vital signs, per Handbook instructions.     Maintain a written record of medications taken and review against the discharge medications sheet (orange paper). Periodically review your Transplant Handbook for important information regarding postoperative care and required precautions.      Labs and Other Follow-ups after Discharge:     Labs Liver  Labs 3x week: Ca, Mag, Phos, Na, K, CI, CO2, BUN, Creatinine, Glucose, CBC, and Cyclosporine trough level, AST, ALT, GGT, AP & T. Bilirubin    Coordinators (these people receive the lab results):  Mariea Stable phone [phone] fax [phone]               Future Appointments:  Appointments which have been scheduled for you    Dec 19, 2016 To Be Determined  SLP - HOME VISIT with Gareth Morgan, SLP  [facility] AND HOSPICE [facility]) [address]  [place] [address]  [phone]   Dec 20, 2016 To Be Determined  PT - HOME VISIT with Duanne Moron, PT  [facility] AND HOSPICE [facility]) [address]  [place] [address]  [phone]   Dec 21, 2016 To Be Determined  OT - HOME VISIT with Graciella Freer, OT  [facility] SCHEDULING [facility]  REGION) [address]  [place] [address]  [phone]   Dec 22, 2016 To Be Determined  PT - HOME VISIT with Duanne Moron, PT  [facility] AND HOSPICE [facility]) [address]  [place] [address]  [phone]   Dec 22, 2016  4:00 AM EDT  SN - HOME VISIT with Peterson Lombard, LPN  [facility] AND HOSPICE [facility]) [address]  [place] [address]  [phone]   Dec 22, 2016  8:30 AM EDT  LAB ONLY with SURTRA LAB ONLY  [facility] TRANSPLANT SURGERY [place] [facility]) [address]  [place] [address]  [phone]   Dec 22, 2016  9:00 AM EDT  SOCIAL WORK with Coralee Rud, MSW  [facility] LIVER TRANSPLANT [place] [facility] REGION) [address]  [place] [address]  [phone]   Dec 22, 2016 10:30 AM EDT  RETURN NUTRITION with Norm Salt, RD/LDN  [facility] NUTRITION SERVICES TRANSPLANT [facility] REGION) [address]  [place] [address]  [phone]   Dec 22, 2016 11:20 AM EDT  RETURN PHARMD with Ruth-Ann Maryln Manuel, CPP  [facility] TRANSPLANT SURGERY [place] [facility]) [address]  [place] [address]  [phone]   Dec 22, 2016  1:15 PM EDT  RETURN 15 with Doyce Loose, MD  [facility] TRANSPLANT SURGERY [place] [facility]) [address]  [place] [address]  [phone]   Dec 24, 2016 To Be Determined  SLP - HOME VISIT with Maree Erie, SLP  [facility] AND HOSPICE [facility]) [address]  [place] [address]  [phone]   Dec 26, 2016 To Be Determined  OT - HOME VISIT with Graciella Freer, OT  [facility] AND HOSPICE [facility]) [address]  [place] [address]  [phone]   Dec 26, 2016 To Be Determined  SN - HOME VISIT with Milderd Meager, RN  [facility] AND HOSPICE [facility]) [address]  [place] [address]  [phone]   Dec 26, 2016  1:30 PM EDT  ELECTROMYOGRAPHY with EMG [facility])  [place] [facility]UR[facility] EEG EMG [facility] REGION) [address]  [place] [address]  [phone]   Dec 27, 2016 To Be Determined  PT - HOME VISIT with Duanne Moron, PT  [facility] AND HOSPICE [facility]) [address]  [place] [address]  [phone]   Dec 27, 2016 To Be Determined  SLP - HOME VISIT with Gareth Morgan, SLP  [facility] HOSPICE [facility]) [address]  [place] [address]  [phone]   Dec 28, 2016 To Be Determined  SN - HOME VISIT with Peterson Lombard, LPN  [facility] AND HOSPICE [facility]) [address]  [place] [address] [phone]   Dec 28, 2016 To Be Determined  OT - HOME VISIT with Graciella Freer, OT  [facility] AND HOSPICE [facility]) [address]  [place] [address]  [phone]   Dec 29, 2016 To Be Determined  PT - HOME VISIT with Duanne Moron, PT  [facility] AND HOSPICE [facility]) [address]  [place] [address]  [phone]   Dec 29, 2016 To Be Determined  SLP - HOME VISIT with Gareth Morgan, SLP  [facility] AND HOSPICE [facility]) [address]  [place] [address]  [phone]   Jan 02, 2017 To Be Determined  SN - HOME VISIT with Peterson Lombard, LPN  [facility] AND HOSPICE [facility]) [address]  [place] [address]  [phone]   Jan 03, 2017 To Be Determined  PT - HOME VISIT with Duanne Moron, PT  [facility] AND HOSPICE [facility]) [address]  [place] [address]  [phone]   Jan 04, 2017 To Be Determined  SN - HOME VISIT with Peterson Lombard, LPN  [facility] AND HOSPICE [facility]) [address]  [place] [address]  [phone]   Jan 05, 2017 To Be Determined  PT - HOME VISIT with Duanne Moron, PT  [facility] AND HOSPICE [facility]) [address]  [place] [address]  [phone]   Jan 06, 2017 12:30 PM EDT  NEW PFT 30 with [place] PFT 4  [facility] PULMONARY SPECIALTY FUNCT [place] [facility] REGION) [address]  [address]  [place] [address]  [phone]   Jan 06, 2017  1:30 PM EDT  NEW  PULM HYPERTENSION with Jettie Booze, MD  [facility] PULMONARY SPECIALTY CL [place] [facility] REGION) [address]  [place] [address]  [phone]   Jan 09, 2017 To Be Determined  SN - HOME VISIT with Peterson Lombard, LPN  [facility] AND HOSPICE [facility]) [address]  [place] [address]  [phone]   Jan 09, 2017 To Be Determined  SN - HOME VISIT with Milderd Meager, RN  [facility] AND HOSPICE [facility]) [address]  [place] [address]  [phone]   Jan 10, 2017  9:20 AM EDT  RETURN  GENERAL with Idelle Crouch, MD  [facility] PHYSICAL MEDICINE [facility] [address]) [address]  [place] [address]  [phone]   Jan 11, 2017 To Be Determined  PT - HOME VISIT with Duanne Moron, PT  [facility] AND HOSPICE [facility]) [address]  [place] [address]  [phone]   Jan 13, 2017 To Be Determined  PT - HOME VISIT with Duanne Moron, PT  [facility] AND HOSPICE [facility]) [address]  [place] [address]  [phone]   Jan 16, 2017 To Be Determined  SN - HOME VISIT with Peterson Lombard, LPN  [facility] AND HOSPICE [facility]) [address]  [place] [address]  [phone]   Jan 17, 2017 To Be Determined  PT - HOME VISIT with Duanne Moron, PT  [facility] HOSPICE [facility]) [address]  [place] [address]  [phone]   Jan 19, 2017 To Be Determined  PT - HOME VISIT with Duanne Moron, PT  [facility] AND HOSPICE [facility]) [address]  [place] [address]  [phone]   Jan 23, 2017 12:00 AM EST  AT HOME PACEMAKER CHECK with [facility] EP REMOTE MONITORING  [facility] EP REMOTE MONITORING [place] [facility] REGION) [address]  2nd Floor [place] [address]  [phone]   Jan 23, 2017 To Be Determined  SN - HOME VISIT with Milderd Meager, RN  [facility] AND HOSPICE [facility]) [address]  [place] [address]  [phone]   Jan 30, 2017 To Be Determined  SN - HOME VISIT with Milderd Meager, RN  [facility] AND HOSPICE [facility]) [address]  [place] [address]  [phone]   Apr 24, 2017 12:00 AM EST  AT HOME PACEMAKER CHECK with [facility] EP REMOTE MONITORING  [facility] EP REMOTE MONITORING [place] [facility] REGION) [address]  2nd Floor [place] [address]  [phone]   Jul 24, 2017 12:00 AM EDT  AT HOME PACEMAKER CHECK with [facility] EP REMOTE MONITORING  [facility] EP REMOTE MONITORING [place] [facility] REGION) [address]  2nd Floor [place] [address]  [phone]

## 2016-12-20 NOTE — Unmapped (Signed)
SRF TEACHING ROUNDS    An interdisciplinary care conference was held today and included the following team members: Heidi Dach, MD Transplant Surgeon, SRF Surgery Resident , Belva Chimes, PA Transplant Physician Assistant, Toy Care, PharmD Transplant Pharmacist, Sibyl Parr, LCSW Transplant Social Worker, Thomasene Mohair, LCSWA Transplant Social Worker, Leeroy Bock, MD Transplant Nephrologist and Medical Student.     Per team, pt admitted due to increased liver enzymes. Hemoglobin down to 10.3. Liver u/s results are pending. Episode of emesis yesterday    Discharge Plan: no date      Sibyl Parr, LCSW  Transplant Social Worker/Case Manager  [facility] for Transplant Care

## 2016-12-21 DIAGNOSIS — G894 Chronic pain syndrome: Secondary | ICD-10-CM

## 2016-12-21 DIAGNOSIS — I272 Pulmonary hypertension, unspecified: Secondary | ICD-10-CM

## 2016-12-21 DIAGNOSIS — Z794 Long term (current) use of insulin: Secondary | ICD-10-CM

## 2016-12-21 DIAGNOSIS — K219 Gastro-esophageal reflux disease without esophagitis: Secondary | ICD-10-CM

## 2016-12-21 DIAGNOSIS — Z6821 Body mass index (BMI) 21.0-21.9, adult: Secondary | ICD-10-CM

## 2016-12-21 DIAGNOSIS — Z7982 Long term (current) use of aspirin: Secondary | ICD-10-CM

## 2016-12-21 DIAGNOSIS — Z8619 Personal history of other infectious and parasitic diseases: Secondary | ICD-10-CM

## 2016-12-21 DIAGNOSIS — I4891 Unspecified atrial fibrillation: Secondary | ICD-10-CM

## 2016-12-21 DIAGNOSIS — Z9181 History of falling: Secondary | ICD-10-CM

## 2016-12-21 DIAGNOSIS — Z431 Encounter for attention to gastrostomy: Secondary | ICD-10-CM

## 2016-12-21 DIAGNOSIS — M961 Postlaminectomy syndrome, not elsewhere classified: Secondary | ICD-10-CM

## 2016-12-21 DIAGNOSIS — E1142 Type 2 diabetes mellitus with diabetic polyneuropathy: Secondary | ICD-10-CM

## 2016-12-21 DIAGNOSIS — Z8505 Personal history of malignant neoplasm of liver: Secondary | ICD-10-CM

## 2016-12-21 DIAGNOSIS — E46 Unspecified protein-calorie malnutrition: Secondary | ICD-10-CM

## 2016-12-21 DIAGNOSIS — N400 Enlarged prostate without lower urinary tract symptoms: Secondary | ICD-10-CM

## 2016-12-21 DIAGNOSIS — D649 Anemia, unspecified: Secondary | ICD-10-CM

## 2016-12-21 DIAGNOSIS — Z4823 Encounter for aftercare following liver transplant: Principal | ICD-10-CM

## 2016-12-21 DIAGNOSIS — Z950 Presence of cardiac pacemaker: Secondary | ICD-10-CM

## 2016-12-21 NOTE — Unmapped (Signed)
[facility]   Patient Onboarding/Medication Counseling    Jeffrey Ward is a 66 y.o. male with liver transplant who I am counseling today on initiation of therapy.    Medication: Cyclosporin 100mg /ml    Verified patient's date of birth / HIPAA.      Education Provided: ??    Dose/Administration discussed: 2.3 ml via g-tube bid. This medication should be taken  without regard to food.     Storage requirements: this medicine should be stored at room temperature.     Side effects discussed: Discussed common side effects, including headache, dizziness, diarrhea, acne, flushing, vomiting. If patient experiences high blood pressure, elevated blood sugar, allergic symptoms, jaundice, numbness, gum changes, hearing or vision changes, shakiness, swelling, they need to call the doctor.  Patient will receive a Lexi-Comp drug information handout with shipment.    Handling precautions reviewed:  Patient was counseled on the hazards surrounding oral chemotherapy and will minimize contact/exposure to the medicine.    Drug Interactions: other medications reviewed and up to date in Epic.  No drug interactions identified.    Comorbidities/Allergies: reviewed and up to date in Epic.    Verified therapy is appropriate and should continue      Delivery Information    Anticipated copay of $0 reviewed with patient. Verified delivery address in FSI and reviewed medication storage requirement.    Scheduled delivery date: 12/23/16    Explained that we ship using UPS and this shipment will require a signature.      Explained the services we provide at [facility] and that each month we would call to set up refills.  Stressed importance of returning phone calls so that we could ensure they receive their medications in time each month.  Informed patient that we should be setting up refills 7-10 days prior to when they will run out of medication.  Informed patient that welcome packet will be sent. Patient verbalized understanding of the above information as well as how to contact the pharmacy at [phone] option 4 with any questions/concerns.        Patient Specific Needs      ? Patient has no physical or cognitive barriers.    ? Patient prefers to have medications discussed with  Family Member     ? Patient is able to read and understand education materials at a high school level or above.        [employer]  [facility] Specialty Pharmacist

## 2016-12-21 NOTE — Unmapped (Addendum)
Let wife know contact information for [facility] shared services to refill medications.  Confirmed with [facility] that patient can refill liquid cyclosporine through them.  Also left details instructions to arrive at 0830 am on Monday 10/26/16 for appointments for lab, 4th floor transplant team appointments, and EMG.  She verbalized understanding.      Per NP Daphine Deutscher patient to get digoxin level on Monday with clinic.  All other lab orders placed.

## 2016-12-21 NOTE — Unmapped (Signed)
A user error has taken place: encounter opened in error, closed for administrative reasons.

## 2016-12-21 NOTE — Unmapped (Signed)
Social Work  Psychosocial Assessment    Patient Name: Jeffrey Ward   Medical Record Number: [id number]   Date of Birth: May 04, 1950  Sex: Male     Referral  Referred by:  (txp)  Reason for Referral: No Psychosocial Interventions Necessary    Discharge Planning  Discharge Planning Information:   Type of Residence: Mailing Address:  [address]  [place] [address]  Contacts:   Accompanied by: Alone  Contact Details: Primary Contact  Primary Contact Name: Erle Guster  Primary Contact Relationship: Spouse  Phone #1: [phone]           Medical Information:     Past Medical History:   Diagnosis Date   ??? Cancer (CMS-HCC)    ??? Cirrhosis (CMS-HCC)    ??? Diabetes (CMS-HCC)    ??? Hepatitis C 07/17/2012   ??? Liver disease    ??? Low back pain 07/17/2012   ??? Varices, esophageal (CMS-HCC)     Social History   Substance Use Topics   ??? Smoking status: Never Smoker   ??? Smokeless tobacco: Never Used      Comment: Smoked in high school for about 5 years.    ??? Alcohol use No      Past Surgical History:   Procedure Laterality Date   ??? ANKLE SURGERY     ??? BACK SURGERY     ??? BACK SURGERY     ??? CHG US GUIDE, TISSUE ABLATION N/A 01/22/2016    Procedure: ULTRASOUND GUIDANCE FOR, AND MONITORING OF, PARENCHYMAL TISSUE ABLATION;  Surgeon: Particia Nearing, MD;  Location: MAIN OR [facility];  Service: Transplant   ??? IR EMBOLIZATION ORGAN ISCHEMIA, TUMORS, INFAR  06/16/2016    IR EMBOLIZATION ORGAN ISCHEMIA, TUMORS, INFAR 06/16/2016 Ammie Dalton, MD IMG VIR H&V [facility]   ??? PR COLON CA SCRN NOT HI RSK IND N/A 02/27/2015    Procedure: COLOREC CNCR SCR;COLNSCPY NO;  Surgeon: Vonda Antigua, MD;  Location: [facility] PROC[facility]URES [facility];  Service: Gastroenterology   ??? PR ENDOSCOPIC ULTRASOUND EXAM N/A 02/27/2015    Procedure: UGI ENDO; W/ENDO ULTRASOUND EXAM INCLUDES ESOPHAGUS, STOMACH, &/OR DUODENUM/JEJUNUM;  Surgeon: Vonda Antigua, MD;  Location: GI PROCEDURES MEM[facility];  Service: Gastroenterology   ??? PR ERCP STENT PLACEMENT BILIARY/PANCREATIC DUCT N/A 11/29/2016    Procedure: ENDOSCOPIC RETROGRADE CHOLANGIOPANCREATOGRAPHY (ERCP); WITH PLACEMENT OF ENDOSCOPIC STENT INTO BILIARY OR PANCREATIC DUCT;  Surgeon: Chriss Driver, MD;  Location: GI PROCEDURES MEMORIAL [facility];  Service: Gastroenterology   ??? PR ERCP,W/REMOVAL STONE,BIL/PANCR DUCTS N/A 11/29/2016    Procedure: ERCP; W/ENDOSCOPIC RETROGRADE REMOVAL OF CALCULUS/CALCULI FROM BILIARY &/OR PANCREATIC DUCTS;  Surgeon: Chriss Driver, MD;  Location: GI PROCEDURES [facility];  Service: Gastroenterology   ??? PR INSER HEART TEMP PACER ONE CHMBR N/A 10/02/2016    Procedure: Tempoarary Pacemaker Insertion;  Surgeon: Meredith Leeds, MD;  Location: [facility];  Service: Cardiology   ??? PR LAP,DIAGNOSTIC ABDOMEN N/A 01/22/2016    Procedure: Laparoscopy, Abdomen, Peritoneum, & Omentum, Diagnostic, W/Wo Collection Specimen(S) By Brushing Or Washing;  Surgeon: Particia Nearing, MD;  Location: MAIN OR [facility];  Service: Transplant   ??? PR PLACE PERCUT GASTROSTOMY TUBE N/A 11/17/2016    Procedure: UGI ENDO; W/DIRECTED PLCMT PERQ GASTROSTOMY TUBE;  Surgeon: Cletis Athens, MD;  Location: GI PROCEDURES MEM[facility];  Service: Gastroenterology   ??? PR TRACHEOSTOMY, PLANNED N/A 09/29/2016    Procedure: TRACHEOSTOMY PLANNED (SEPART PROC);  Surgeon: Katherina Mires, MD;  Location: MAIN OR [facility];  Service:  Trauma   ??? PR TRANSCATH INSERT OR REPLACE LEADLESS PM VENTR N/A 10/11/2016    Procedure: Pacemaker Implant/Replace Leadless;  Surgeon: Meredith Leeds, MD;  Location: [facility];  Service: Cardiology   ??? PR TRANSPLANT LIVER,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    Procedure: Liver Allotransplantation; Orthotopic, Partial Or Whole, From Cadaver Or Living Donor, Any Age;  Surgeon: Doyce Loose, MD;  Location: MAIN OR [facility];  Service: Transplant   ??? PR TRANSPLANT,PREP DONOR LIVER, WHOLE N/A 09/15/2016    Procedure: Rogelia Boga Std Prep Cad Donor Whole Liver Gft Prior Tnsplnt,Inc Chole,Diss/Rem Surr Tissu Wo Triseg/Lobe Splt;  Surgeon: Doyce Loose, MD;  Location: MAIN OR [facility];  Service: Transplant   ??? PR UPPER GI ENDOSCOPY,BIOPSY N/A 07/17/2012    Procedure: UGI ENDOSCOPY; WITH BIOPSY, SINGLE OR MULTIPLE;  Surgeon: Alba Destine, MD;  Location: GI PROCEDURES [facility];  Service: Gastroenterology   ??? PR UPPER GI ENDOSCOPY,DIAGNOSIS N/A 02/04/2014    Procedure: UGI ENDO, INCLUDE ESOPHAGUS, STOMACH, & DUODENUM &/OR JEJUNUM; DX W/WO COLLECTION SPECIMN, BY BRUSH OR WASH;  Surgeon: Wilburt Finlay, MD;  Location: GI PROCEDURES [facility]IAL [facility];  Service: Gastroenterology   ??? PR UPPER GI ENDOSCOPY,LIGAT VARIX N/A 11/05/2013    Procedure: UGI ENDO; W/BAND LIG ESOPH &/OR GASTRIC VARICES;  Surgeon: Wilburt Finlay, MD;  Location: GI PROCEDURES [facility];  Service: Gastroenterology    Family History   Problem Relation Age of Onset   ??? Hypertension Mother    ??? Cirrhosis Neg Hx    ??? Liver cancer Neg Hx           Previous admit date: 11/24/2016     Financial Information:   Primary Insurance: Payor: MEDICARE / Plan: MEDICARE PART A AND PART B / Product Type: *No Product type* /    Secondary Insurance: Secondary Conservator, museum/gallery   Prescription Coverage: Nurse, learning disability (listed above)   Preferred Pharmacy: [facility] - [place], [place] - [address]  [facility] - [place], [place] - [address]  [facility] - ,  - [address]    Barriers to taking medication: No    Transition Home:   Transportation at time of discharge: Family/Friend's Sales executive    Anticipated changes related to Illness: inability to care for self inability to care for someone else   Services in place prior to admission: N/A   Services anticipated for DC: none   Hemodialysis Prior to Admission: No    Readmission Within the Last 30 Days: unable to assess    Legal NOK/Guardian/POA/AD:   Surrogate Decision Makers: At this time the patient is able to make  his own decisions.    Contact and Advanced Directives  Contact Details  Contact Details: Primary Contact  Primary Contact Name: Tracie Lindbloom  Primary Contact Relationship: Spouse  Phone #1: [phone]    Advance Directive (Medical Treatment)  Does patient have an advance directive covering medical treatment?: Patient does not have advance directive covering medical treatment.  Reason patient does not have an advance directive covering medical treatment:: Patient does not wish to complete one at this time  Surrogate decision maker appointed:: No  Reason there is not a surrogate decision maker appointed:: Patient does not wish to appoint a surrogate decision maker at this time  Information provided on advance directive:: Yes  Patient requests assistance:: No          Social History  Support Systems: Spouse, Family  Members      Education & Communication: Conservation officer, nature Service: Veteran (Retired, Discharged, Catering manager.)       Comment: unknown       Medical and Psychiatric History  Psychosocial Stressors: Denies      Psychological Issues/Information: No issues              Chemical Dependency: None              Outpatient Providers: Specialist   Name / Contact #: : [facility]  Legal: No legal issues      Ability to [employer]: No issues accessing community services

## 2016-12-21 NOTE — Unmapped (Signed)
Addended by: Mariea Stable A on: 12/21/2016 02:20 PM     Modules accepted: Orders

## 2016-12-22 ENCOUNTER — Ambulatory Visit
Admission: RE | Admit: 2016-12-22 | Discharge: 2016-12-22 | Disposition: A | Discharge status: Home / Self Care | Payer: MEDICARE

## 2016-12-22 DIAGNOSIS — R531 Weakness: Secondary | ICD-10-CM

## 2016-12-22 DIAGNOSIS — Z6821 Body mass index (BMI) 21.0-21.9, adult: Secondary | ICD-10-CM

## 2016-12-22 DIAGNOSIS — D649 Anemia, unspecified: Secondary | ICD-10-CM

## 2016-12-22 DIAGNOSIS — N400 Enlarged prostate without lower urinary tract symptoms: Secondary | ICD-10-CM

## 2016-12-22 DIAGNOSIS — M961 Postlaminectomy syndrome, not elsewhere classified: Secondary | ICD-10-CM

## 2016-12-22 DIAGNOSIS — Z8505 Personal history of malignant neoplasm of liver: Secondary | ICD-10-CM

## 2016-12-22 DIAGNOSIS — E1142 Type 2 diabetes mellitus with diabetic polyneuropathy: Secondary | ICD-10-CM

## 2016-12-22 DIAGNOSIS — I4891 Unspecified atrial fibrillation: Secondary | ICD-10-CM

## 2016-12-22 DIAGNOSIS — Z8619 Personal history of other infectious and parasitic diseases: Secondary | ICD-10-CM

## 2016-12-22 DIAGNOSIS — Z794 Long term (current) use of insulin: Secondary | ICD-10-CM

## 2016-12-22 DIAGNOSIS — Z950 Presence of cardiac pacemaker: Secondary | ICD-10-CM

## 2016-12-22 DIAGNOSIS — I272 Pulmonary hypertension, unspecified: Secondary | ICD-10-CM

## 2016-12-22 DIAGNOSIS — G894 Chronic pain syndrome: Secondary | ICD-10-CM

## 2016-12-22 DIAGNOSIS — Z4823 Encounter for aftercare following liver transplant: Principal | ICD-10-CM

## 2016-12-22 DIAGNOSIS — E46 Unspecified protein-calorie malnutrition: Secondary | ICD-10-CM

## 2016-12-22 DIAGNOSIS — Z431 Encounter for attention to gastrostomy: Secondary | ICD-10-CM

## 2016-12-22 DIAGNOSIS — B1920 Unspecified viral hepatitis C without hepatic coma: Principal | ICD-10-CM

## 2016-12-22 DIAGNOSIS — Z7982 Long term (current) use of aspirin: Secondary | ICD-10-CM

## 2016-12-22 DIAGNOSIS — K219 Gastro-esophageal reflux disease without esophagitis: Secondary | ICD-10-CM

## 2016-12-22 DIAGNOSIS — Z9181 History of falling: Secondary | ICD-10-CM

## 2016-12-22 LAB — COMPREHENSIVE METABOLIC PANEL
ALBUMIN: 3.6 g/dL (ref 3.5–5.0)
ALKALINE PHOSPHATASE: 218 U/L — ABNORMAL HIGH (ref 38–126)
ANION GAP: 7 mmol/L — ABNORMAL LOW (ref 9–15)
AST (SGOT): 28 U/L (ref 19–55)
BILIRUBIN TOTAL: 1 mg/dL (ref 0.0–1.2)
BLOOD UREA NITROGEN: 18 mg/dL (ref 7–21)
BUN / CREAT RATIO: 18
CALCIUM: 9.4 mg/dL (ref 8.5–10.2)
CHLORIDE: 104 mmol/L (ref 98–107)
CO2: 26 mmol/L (ref 22.0–30.0)
CREATININE: 1.02 mg/dL (ref 0.70–1.30)
EGFR MDRD AF AMER: >=60 mL/min/{1.73_m2} (ref >=60)
EGFR MDRD NON AF AMER: >=60 mL/min/{1.73_m2} (ref >=60)
GLUCOSE RANDOM: 75 mg/dL (ref 65–179)
PROTEIN TOTAL: 5.8 g/dL — ABNORMAL LOW (ref 6.5–8.3)
SODIUM: 137 mmol/L (ref 135–145)

## 2016-12-22 LAB — CBC
HEMATOCRIT: 30.5 % — ABNORMAL LOW (ref 41.0–53.0)
HEMOGLOBIN: 10.5 g/dL — ABNORMAL LOW (ref 13.5–17.5)
MEAN CORPUSCULAR HEMOGLOBIN CONC: 34.4 g/dL (ref 31.0–37.0)
MEAN CORPUSCULAR VOLUME: 95.4 fL (ref 80.0–100.0)
MEAN PLATELET VOLUME: 10 fL (ref 7.0–10.0)
PLATELET COUNT: 93 10*9/L — ABNORMAL LOW (ref 150–440)
RED BLOOD CELL COUNT: 3.2 10*12/L — ABNORMAL LOW (ref 4.50–5.90)
RED CELL DISTRIBUTION WIDTH: 20.7 % — ABNORMAL HIGH (ref 12.0–15.0)
WBC ADJUSTED: 2 10*9/L — ABNORMAL LOW (ref 4.5–11.0)

## 2016-12-22 LAB — DIGOXIN LEVEL: Digoxin:MCnc:Pt:Ser/Plas:Qn:: 0.6 — ABNORMAL LOW

## 2016-12-22 LAB — PHOSPHORUS: Phosphate:MCnc:Pt:Ser/Plas:Qn:: 3

## 2016-12-22 LAB — ALBUMIN: Albumin:MCnc:Pt:Ser/Plas:Qn:: 3.6

## 2016-12-22 LAB — RED CELL DISTRIBUTION WIDTH: Lab: 20.7 — ABNORMAL HIGH

## 2016-12-22 LAB — MAGNESIUM: Magnesium:MCnc:Pt:Ser/Plas:Qn:: 1.8

## 2016-12-22 LAB — BILIRUBIN DIRECT: Bilirubin.glucuronidated:MCnc:Pt:Ser/Plas:Qn:: 0.3

## 2016-12-22 MED FILL — CYCLOSPORINE MODIFIED ORAL/100MG/ML/SOLN: CYCLOSPORINE MODIFIED ORAL/100MG/ML/SOLN | 21 days supply | Qty: 2 | Fill #0

## 2016-12-22 NOTE — Unmapped (Signed)
Confirmed patient lab orders are for CBC with diff.  Did add on order to ensure patient can have the idff run since his WBC is a trending down.

## 2016-12-23 DIAGNOSIS — G894 Chronic pain syndrome: Secondary | ICD-10-CM

## 2016-12-23 DIAGNOSIS — M961 Postlaminectomy syndrome, not elsewhere classified: Secondary | ICD-10-CM

## 2016-12-23 DIAGNOSIS — I4891 Unspecified atrial fibrillation: Secondary | ICD-10-CM

## 2016-12-23 DIAGNOSIS — I272 Pulmonary hypertension, unspecified: Secondary | ICD-10-CM

## 2016-12-23 DIAGNOSIS — E1142 Type 2 diabetes mellitus with diabetic polyneuropathy: Secondary | ICD-10-CM

## 2016-12-23 DIAGNOSIS — D649 Anemia, unspecified: Secondary | ICD-10-CM

## 2016-12-23 DIAGNOSIS — Z950 Presence of cardiac pacemaker: Secondary | ICD-10-CM

## 2016-12-23 DIAGNOSIS — Z4823 Encounter for aftercare following liver transplant: Principal | ICD-10-CM

## 2016-12-23 DIAGNOSIS — Z8505 Personal history of malignant neoplasm of liver: Secondary | ICD-10-CM

## 2016-12-23 DIAGNOSIS — Z794 Long term (current) use of insulin: Secondary | ICD-10-CM

## 2016-12-23 DIAGNOSIS — Z8619 Personal history of other infectious and parasitic diseases: Secondary | ICD-10-CM

## 2016-12-23 DIAGNOSIS — Z431 Encounter for attention to gastrostomy: Secondary | ICD-10-CM

## 2016-12-23 DIAGNOSIS — Z7982 Long term (current) use of aspirin: Secondary | ICD-10-CM

## 2016-12-23 DIAGNOSIS — Z6821 Body mass index (BMI) 21.0-21.9, adult: Secondary | ICD-10-CM

## 2016-12-23 DIAGNOSIS — Z9181 History of falling: Secondary | ICD-10-CM

## 2016-12-23 DIAGNOSIS — E46 Unspecified protein-calorie malnutrition: Secondary | ICD-10-CM

## 2016-12-23 DIAGNOSIS — N400 Enlarged prostate without lower urinary tract symptoms: Secondary | ICD-10-CM

## 2016-12-23 DIAGNOSIS — K219 Gastro-esophageal reflux disease without esophagitis: Secondary | ICD-10-CM

## 2016-12-23 LAB — CYCLOSPORINE, TROUGH: Lab: 127

## 2016-12-23 NOTE — Unmapped (Signed)
Wife reports patient missed dose of Cyclosporine once on Monday night. Reviewed labs and since CSA levels was low last time also reviewed with Dr. Celine Mans.  Stated to recheck on Monday which is the plan since patient is coming to clinic.

## 2016-12-24 DIAGNOSIS — K219 Gastro-esophageal reflux disease without esophagitis: Secondary | ICD-10-CM

## 2016-12-24 DIAGNOSIS — M961 Postlaminectomy syndrome, not elsewhere classified: Secondary | ICD-10-CM

## 2016-12-24 DIAGNOSIS — Z6821 Body mass index (BMI) 21.0-21.9, adult: Secondary | ICD-10-CM

## 2016-12-24 DIAGNOSIS — Z794 Long term (current) use of insulin: Secondary | ICD-10-CM

## 2016-12-24 DIAGNOSIS — I272 Pulmonary hypertension, unspecified: Secondary | ICD-10-CM

## 2016-12-24 DIAGNOSIS — I4891 Unspecified atrial fibrillation: Secondary | ICD-10-CM

## 2016-12-24 DIAGNOSIS — Z950 Presence of cardiac pacemaker: Secondary | ICD-10-CM

## 2016-12-24 DIAGNOSIS — Z431 Encounter for attention to gastrostomy: Secondary | ICD-10-CM

## 2016-12-24 DIAGNOSIS — Z4823 Encounter for aftercare following liver transplant: Principal | ICD-10-CM

## 2016-12-24 DIAGNOSIS — Z7982 Long term (current) use of aspirin: Secondary | ICD-10-CM

## 2016-12-24 DIAGNOSIS — Z8619 Personal history of other infectious and parasitic diseases: Secondary | ICD-10-CM

## 2016-12-24 DIAGNOSIS — Z8505 Personal history of malignant neoplasm of liver: Secondary | ICD-10-CM

## 2016-12-24 DIAGNOSIS — E46 Unspecified protein-calorie malnutrition: Secondary | ICD-10-CM

## 2016-12-24 DIAGNOSIS — N400 Enlarged prostate without lower urinary tract symptoms: Secondary | ICD-10-CM

## 2016-12-24 DIAGNOSIS — E1142 Type 2 diabetes mellitus with diabetic polyneuropathy: Secondary | ICD-10-CM

## 2016-12-24 DIAGNOSIS — D649 Anemia, unspecified: Secondary | ICD-10-CM

## 2016-12-24 DIAGNOSIS — Z9181 History of falling: Secondary | ICD-10-CM

## 2016-12-24 DIAGNOSIS — G894 Chronic pain syndrome: Secondary | ICD-10-CM

## 2016-12-26 ENCOUNTER — Ambulatory Visit
Admission: RE | Admit: 2016-12-26 | Discharge: 2016-12-26 | Disposition: A | Discharge status: Home / Self Care | Payer: MEDICARE | Attending: Pharmacist Clinician (PhC)/ Clinical Pharmacy Specialist | Admitting: Pharmacist Clinician (PhC)/ Clinical Pharmacy Specialist

## 2016-12-26 ENCOUNTER — Ambulatory Visit
Admission: RE | Admit: 2016-12-26 | Discharge: 2016-12-26 | Disposition: A | Discharge status: Home / Self Care | Payer: MEDICARE

## 2016-12-26 ENCOUNTER — Ambulatory Visit: Admission: RE | Admit: 2016-12-26 | Discharge: 2016-12-26 | Disposition: A | Discharge status: Home / Self Care

## 2016-12-26 ENCOUNTER — Ambulatory Visit
Admission: RE | Admit: 2016-12-26 | Discharge: 2016-12-26 | Disposition: A | Discharge status: Home / Self Care | Payer: MEDICARE | Attending: Registered" | Admitting: Registered"

## 2016-12-26 DIAGNOSIS — I272 Pulmonary hypertension, unspecified: Secondary | ICD-10-CM

## 2016-12-26 DIAGNOSIS — Z4823 Encounter for aftercare following liver transplant: Principal | ICD-10-CM

## 2016-12-26 DIAGNOSIS — D649 Anemia, unspecified: Secondary | ICD-10-CM

## 2016-12-26 DIAGNOSIS — G894 Chronic pain syndrome: Secondary | ICD-10-CM

## 2016-12-26 DIAGNOSIS — Z6821 Body mass index (BMI) 21.0-21.9, adult: Secondary | ICD-10-CM

## 2016-12-26 DIAGNOSIS — Z13220 Encounter for screening for lipoid disorders: Secondary | ICD-10-CM

## 2016-12-26 DIAGNOSIS — Z8505 Personal history of malignant neoplasm of liver: Secondary | ICD-10-CM

## 2016-12-26 DIAGNOSIS — Z794 Long term (current) use of insulin: Secondary | ICD-10-CM

## 2016-12-26 DIAGNOSIS — M961 Postlaminectomy syndrome, not elsewhere classified: Secondary | ICD-10-CM

## 2016-12-26 DIAGNOSIS — Z950 Presence of cardiac pacemaker: Secondary | ICD-10-CM

## 2016-12-26 DIAGNOSIS — Z8619 Personal history of other infectious and parasitic diseases: Secondary | ICD-10-CM

## 2016-12-26 DIAGNOSIS — Z944 Liver transplant status: Principal | ICD-10-CM

## 2016-12-26 DIAGNOSIS — Z79899 Other long term (current) drug therapy: Secondary | ICD-10-CM

## 2016-12-26 DIAGNOSIS — Z9181 History of falling: Secondary | ICD-10-CM

## 2016-12-26 DIAGNOSIS — E1142 Type 2 diabetes mellitus with diabetic polyneuropathy: Secondary | ICD-10-CM

## 2016-12-26 DIAGNOSIS — E559 Vitamin D deficiency, unspecified: Secondary | ICD-10-CM

## 2016-12-26 DIAGNOSIS — Z5181 Encounter for therapeutic drug level monitoring: Principal | ICD-10-CM

## 2016-12-26 DIAGNOSIS — Z431 Encounter for attention to gastrostomy: Secondary | ICD-10-CM

## 2016-12-26 DIAGNOSIS — E46 Unspecified protein-calorie malnutrition: Secondary | ICD-10-CM

## 2016-12-26 DIAGNOSIS — K769 Liver disease, unspecified: Secondary | ICD-10-CM

## 2016-12-26 DIAGNOSIS — K219 Gastro-esophageal reflux disease without esophagitis: Secondary | ICD-10-CM

## 2016-12-26 DIAGNOSIS — I4891 Unspecified atrial fibrillation: Secondary | ICD-10-CM

## 2016-12-26 DIAGNOSIS — Z131 Encounter for screening for diabetes mellitus: Secondary | ICD-10-CM

## 2016-12-26 DIAGNOSIS — N400 Enlarged prostate without lower urinary tract symptoms: Secondary | ICD-10-CM

## 2016-12-26 DIAGNOSIS — Z7982 Long term (current) use of aspirin: Secondary | ICD-10-CM

## 2016-12-26 DIAGNOSIS — R531 Weakness: Principal | ICD-10-CM

## 2016-12-26 LAB — CBC W/ AUTO DIFF
BASOPHILS ABSOLUTE COUNT: 0 10*9/L (ref 0.0–0.1)
HEMATOCRIT: 36.7 % — ABNORMAL LOW (ref 41.0–53.0)
HEMOGLOBIN: 12.3 g/dL — ABNORMAL LOW (ref 13.5–17.5)
LARGE UNSTAINED CELLS: 1 % (ref 0–4)
LYMPHOCYTES ABSOLUTE COUNT: 0.5 10*9/L — ABNORMAL LOW (ref 1.5–5.0)
MEAN CORPUSCULAR HEMOGLOBIN CONC: 33.5 g/dL (ref 31.0–37.0)
MEAN CORPUSCULAR HEMOGLOBIN: 32.3 pg (ref 26.0–34.0)
MEAN PLATELET VOLUME: 8.5 fL (ref 7.0–10.0)
MONOCYTES ABSOLUTE COUNT: 0.1 10*9/L — ABNORMAL LOW (ref 0.2–0.8)
NEUTROPHILS ABSOLUTE COUNT: 1.8 10*9/L — ABNORMAL LOW (ref 2.0–7.5)
PLATELET COUNT: 145 10*9/L — ABNORMAL LOW (ref 150–440)
RED BLOOD CELL COUNT: 3.81 10*12/L — ABNORMAL LOW (ref 4.50–5.90)
RED CELL DISTRIBUTION WIDTH: 19.6 % — ABNORMAL HIGH (ref 12.0–15.0)
WBC ADJUSTED: 2.6 10*9/L — ABNORMAL LOW (ref 4.5–11.0)

## 2016-12-26 LAB — COMPREHENSIVE METABOLIC PANEL
ALBUMIN: 4.1 g/dL (ref 3.5–5.0)
ALKALINE PHOSPHATASE: 182 U/L — ABNORMAL HIGH (ref 38–126)
ALT (SGPT): 42 U/L (ref 19–72)
ANION GAP: 10 mmol/L (ref 9–15)
AST (SGOT): 32 U/L (ref 19–55)
BILIRUBIN TOTAL: 1.1 mg/dL (ref 0.0–1.2)
BLOOD UREA NITROGEN: 21 mg/dL (ref 7–21)
BUN / CREAT RATIO: 20
CALCIUM: 9.6 mg/dL (ref 8.5–10.2)
CO2: 23 mmol/L (ref 22.0–30.0)
CREATININE: 1.03 mg/dL (ref 0.70–1.30)
EGFR MDRD AF AMER: >=60 mL/min/{1.73_m2} (ref >=60)
EGFR MDRD NON AF AMER: >=60 mL/min/{1.73_m2} (ref >=60)
GLUCOSE RANDOM: 120 mg/dL — ABNORMAL HIGH (ref 65–99)
POTASSIUM: 4.5 mmol/L (ref 3.5–5.0)
PROTEIN TOTAL: 6.7 g/dL (ref 6.5–8.3)
SODIUM: 137 mmol/L (ref 135–145)

## 2016-12-26 LAB — LIPID PANEL
CHOLESTEROL/HDL RATIO SCREEN: 3 (ref <5.0)
HDL CHOLESTEROL: 70 mg/dL — ABNORMAL HIGH (ref 40–59)
TRIGLYCERIDES: 101 mg/dL (ref 1–149)
VLDL CHOLESTEROL CAL: 20.2 mg/dL (ref 12–42)

## 2016-12-26 LAB — ESTIMATED AVERAGE GLUCOSE: Estimated average glucose:MCnc:Pt:Bld:Qn:Estimated from glycated hemoglobin: 100

## 2016-12-26 LAB — PHOSPHORUS: Phosphate:MCnc:Pt:Ser/Plas:Qn:: 2.8 — ABNORMAL LOW

## 2016-12-26 LAB — FREE T4: Thyroxine.free:MCnc:Pt:Ser/Plas:Qn:: 1.98 — ABNORMAL HIGH

## 2016-12-26 LAB — SMEAR REVIEW

## 2016-12-26 LAB — GAMMA GLUTAMYL TRANSFERASE: Gamma glutamyl transferase:CCnc:Pt:Ser/Plas:Qn:: 170 — ABNORMAL HIGH

## 2016-12-26 LAB — CYCLOSPORINE, TROUGH: Lab: 65

## 2016-12-26 LAB — TRIGLYCERIDES: Triglyceride:MCnc:Pt:Ser/Plas:Qn:: 101

## 2016-12-26 LAB — HEMOGLOBIN A1C: ESTIMATED AVERAGE GLUCOSE: 100 mg/dL

## 2016-12-26 LAB — BILIRUBIN TOTAL: Bilirubin:MCnc:Pt:Ser/Plas:Qn:: 1.1

## 2016-12-26 LAB — THYROID STIMULATING HORMONE: Thyrotropin:ACnc:Pt:Ser/Plas:Qn:: 8.26 — ABNORMAL HIGH

## 2016-12-26 LAB — PROTIME-INR: INR: 3.26

## 2016-12-26 LAB — DIGOXIN LEVEL
DIGOXIN LEVEL: 0.4 ng/mL — ABNORMAL LOW (ref 0.8–1.8)
Digoxin:MCnc:Pt:Ser/Plas:Qn:: 0.4 — ABNORMAL LOW

## 2016-12-26 LAB — AFP TUMOR MARKER: AFP-TUMOR MARKER: 1.93 ng/mL (ref <7.51)

## 2016-12-26 LAB — PROTIME: Lab: 37.2 — ABNORMAL HIGH

## 2016-12-26 LAB — AFP-TUMOR MARKER: Alpha-1-Fetoprotein.tumor marker:MCnc:Pt:Ser/Plas:Qn:: 1.93

## 2016-12-26 LAB — NEUTROPHIL LEFT SHIFT

## 2016-12-26 LAB — BILIRUBIN DIRECT: Bilirubin.glucuronidated:MCnc:Pt:Ser/Plas:Qn:: 0.4

## 2016-12-26 LAB — VITAMIN D, TOTAL (25OH): Lab: 34.2

## 2016-12-26 LAB — MAGNESIUM: Magnesium:MCnc:Pt:Ser/Plas:Qn:: 1.6

## 2016-12-26 MED ORDER — NEORAL 25 MG CAPSULE: each | 11 refills | 0 days

## 2016-12-26 MED ORDER — OMEPRAZOLE 20 MG CAPSULE,DELAYED RELEASE
ORAL_CAPSULE | Freq: Every day | ORAL | 1 refills | 0 days
Start: 2016-12-26 — End: 2017-12-26

## 2016-12-26 MED ORDER — MAGNESIUM OXIDE 400 MG (241.3 MG MAGNESIUM) TABLET
ORAL_TABLET | Freq: Three times a day (TID) | GASTROSTOMY | 11 refills | 0 days
Start: 2016-12-26 — End: 2017-01-17

## 2016-12-26 MED ORDER — WARFARIN 1 MG TABLET
ORAL_TABLET | 11 refills | 0 days
Start: 2016-12-26 — End: 2016-12-27

## 2016-12-26 MED ORDER — NEORAL 100 MG CAPSULE: capsule | 11 refills | 0 days | Status: AC

## 2016-12-26 MED ORDER — NEORAL 25 MG CAPSULE
ORAL | 11 refills | 0.00000 days | Status: CP
Start: 2016-12-26 — End: 2016-12-26

## 2016-12-26 MED ORDER — NEORAL 100 MG CAPSULE
ORAL | 11 refills | 0.00000 days | Status: CP
Start: 2016-12-26 — End: 2016-12-26

## 2016-12-26 MED FILL — NEORAL/25MG/CAP: NEORAL/25MG/CAP | 30 days supply | Qty: 120 | Fill #0

## 2016-12-26 MED FILL — NEORAL/100MG/CAP: NEORAL/100MG/CAP | 15 days supply | Qty: 60 | Fill #0

## 2016-12-26 NOTE — Unmapped (Signed)
Outpatient Adult Nutrition-Transplant Follow Up    Referring MD or Clinic: Liver Transplant Clinic    Reason for Referral: Follow-up  Other; s/p liver txp 09/15/16    PMH:   Patient Active Problem List   Diagnosis   ??? Hepatitis C   ??? Low back pain   ??? Hematuria   ??? Calculus of ureter   ??? Anemia   ??? Benign prostatic hyperplasia   ??? Chronic pain disorder   ??? Dysrhythmia   ??? History of gastroesophageal reflux (GERD)   ??? History of hepatitis C virus infection   ??? History of substance abuse   ??? Hyperglycemia, unspecified   ??? Intermittent vertigo   ??? Microscopic hematuria   ??? Postlaminectomy syndrome, lumbar region   ??? Thrombocytopenia (CMS-HCC)   ??? Vitamin D deficiency   ??? Atrial fibrillation with RVR (CMS-HCC)   ??? Secondary esophageal varices (CMS-HCC)   ??? Right ventricular dilation   ??? End-stage liver disease (CMS-HCC)   ??? HCC (hepatocellular carcinoma) (CMS-HCC)   ??? Paroxysmal atrial fibrillation (CMS-HCC)   ??? Hepatic encephalopathy (CMS-HCC)   ??? S/P liver transplant (CMS-HCC)   ??? Pulmonary hypertension, moderate to severe (CMS-HCC)   ??? Acute right heart failure (CMS-HCC)   ??? Tracheostomy dependent (CMS-HCC)   ??? Mixed level of activity delirium due to multiple etiologies, resolved   ??? Insomnia   ??? Peripheral neuropathy     Anthropometric Data:  -- Height:     Ht Readings from Last 1 Encounters:   12/26/16 172.7 cm (5' 7.99)   -- Last recorded weight:     Wt Readings from Last 1 Encounters:   12/26/16 63.5 kg (140 lb)   -- IBW:  69.9 kg  -- Percent IBW: ~91%  -- AdjIBW: N/A  -- BMI: 21.3    -- Weight history: 140 lbs is UBW. Pt has been maintaining.   Wt Readings from Last 10 Encounters:   12/26/16 63.5 kg (140 lb)   12/26/16 63.5 kg (140 lb)   12/19/16 63 kg (139 lb)   12/16/16 63 kg (139 lb)   12/15/16 63.5 kg (140 lb)   12/12/16 63.5 kg (140 lb)   12/02/16 69 kg (152 lb 1.9 oz)   11/29/16 68 kg (150 lb)   11/22/16 67.8 kg (149 lb 7.6 oz)   10/08/16 73.5 kg (162 lb 0.6 oz)     Nutrition-Focused Physical Findings: Findings remain unchanged; pt has not experienced any notable weight gain  Fat Areas Examined  Orbital: Mild/moderate loss  Upper Arm: Mild/moderate loss????????????????????????????????????????????  ??  Muscle Areas Examined  Temple: Severe loss  Clavicle: Severe loss  Acromion: Severe loss  Scapular: Severe loss  Anterior Thigh: Mild/moderate loss  Posterior Calf: Mild/moderate loss  ??  ??  ??  Nutrition Evaluation  Overall Impressions: Moderate fat loss;Severe muscle loss (10/25/16 1503)    Relevant Medications, Herbs, Supplements include: Vit D3, MVI, spironolactone     Currently out of cyclosporine; informed PharmD    Relevant Labs:  Reviewed; Phos low at 2.8. Vit D 34.2  ANC 1.8    Physical Activity: using walker today, energy improved    Dietary Restrictions, Intolerances: n/a     Allergies: No Known Allergies    Hunger and Satiety: normal    24-Hour recall/usual intake:   1st - 1 egg, 4 slices bacon  Snack - n/a  2nd - pot roast, boiled potatoes, pinto beans, mac and cheese, corn bread, cabbage slaw  Snack - 1 can osmolite  1.5  3rd - see above  Snack - n/a  Beverages - water    Nutrition History: pt reports good appetite & PO intake at home, stable weight. Occasional nausea with TFs. Eating 3 large meals per day with 1 additional can of Osmolite 1.5.     Per wife, coughed up blood yesterday. Encouraged her to inform MD.    Social: accompanied by wife     Estimated Needs:   Estimated Energy Needs:  2154 kcal/day (cirrhosis using IBW)  Estimated Protein Needs:  86-107 g protein/day (cirrhosis using IBW)  Estimated Fluid Needs:  per MD    Nutrition Assessment: suspect pt is meeting nutrient needs w/ improved PO intake and 1 can Osmolite 1.5 per day, as evidenced by stable weight. Discussed importance of supplementing with TFs if unable to consume >50% of each meal. Pt is aware of need to meet energy/protein needs in order to promote healing w/ eventual weight gain towards IBW.    Malnutrition Assessment using AND/ASPEN Clinical Characteristics:  Non-severe (Moderate) Protein-Calorie Malnutrition in the context of chronic illness (12/20/16 1221)  Fat Loss: Moderate  Muscle Loss: Severe      Nutrition Goals:   1. Meet estimated daily needs  2. Balanced macronutrient intake  3. Maintain hemoglobin A1c <7%  4. Prevent wt loss    Interventions:  1. Continue food safety precautions until ANC > 2  2. Continue eating at least 3 meals per day  3. Continue 1 can of Osmolite 1.5 per day  4. Supplement w/ additional TF bolus if unable to consume >50% of meal    Materials Provided were:  Tips and suggestions     Follow-up: with Jackqulyn Livings, RD    Length of visit was: 10 minutes

## 2016-12-26 NOTE — Unmapped (Addendum)
Patient seen with Dr. Celine Mans and PharmD Nedra Hai.  Patient in clinic with spouse. Patient denies use of alcohol, tobacco and controlled substances    Patient denies nausea, vomiting, diarrhea, constipation, night sweats,  fevers, chills, shortness of breath, extremity swelling and ascites.   Patient reports  pain. Pain located in his back 7-8/10 chronic and has been occurring prior to transplant.  Per PharmD Nedra Hai no changes need to be made even though digoxin level is 04 and coumadin changed by pharmacy since INR was 3.6.    Patient educated 15 minutes reviewed the 3 month education packet slightly with patient per his wish will review the rest of it when he returns to clinic.  Patient educated to bring all medications, orange cards, and vital sign logs to clinic.      Patient taking Brand Neoral taken at 2000 at 12/25/16. Patient to get MIC-KEY feeding tube to replace current tube per Dr. Celine Mans.  See Providers note for further detail.         Patient is currently unemployed; retired.  His functional score is currently 60%; Requires occasional assistance but is able to care for most personal needs.Marland Kitchen

## 2016-12-26 NOTE — Unmapped (Signed)
[facility] Department of Surgery,   [facility] [address]  [place], [place]  [address]  Ph: [phone]    TRANSPLANT SURGERY CLINIC NOTE    Assessment/Plan:    Jeffrey Ward is a 66 y.o. male who underwent liver transplant on 09/15/2016 with complicated post-operative course and recent re-admission for elevated LFTs of unknown origin. The patient is in clinic and after obtaining the HPI and reviewing the chart we addressed the following medical issues:       CLINIC RECOMMENDATIONS :  1. Immunosuppressive management: Cyclosporine 200 mg BID, (goal 150-200), Myfortic 500 mg BID  2. Lab frequency: 3x weekly  3. Will attempt to transition to PO meds (ASA, digoxin etc.) with plans to convert all meds to PO next visit  4. Continue to supplement with tube feeds as needed for poor PO intake  5. Continue warfarin and digoxin per cardiology  6. Discontinue Valcyte ppx today  7. Monitor per protocol with MRCP for HCC  8. RTC in 1-2 weeks to exchange G-tube for Mic-E button. Will need G-tube in place at least 3 months.   ______________________________________________________________________    HPI: Jeffrey Ward is a 20M with IDDM, GERD, prior perioperative a-fib with RVR and cirrhosis secondary to chronic hepatitis C and HCC who underwent liver transplantation on 09/15/16. Post-op course was complicated by decompensation secondary to acute right heart failure,??AKI requiring CRRT (resolved), severe pulmonary hypertension responsive to sildenafil, bradycardia/ asystole requiring pacemaker placement, atrial fibrilliation/ narrow complex tachycardia requiring anti-coagulation with warfarin and amiodarone/digoxin, malnutrition/debility, dysphagia requiring G tube placement, and altered mental status/delirium improved with transition to cyclosporine and psychiatric medications. He was discharged to [facility] 9/13 and discharged to home 12/10/16.   ??  He was readmitted 12/19/16 for increased LFT's and poor PO intake. Transplant liver ultrasound showed patent CBD and non-obstructing debris within the bile duct. His LFT's improved without intervention. He discussed tube feed regimen with nutrition and will supplement with bolus tube feeds when he does not consume >50% of his meals.      Since discharge home he has been doing well and eating at least three Ward meals. His wife has been giving his medications and 1 can tube feeds daily through the PEG tube. He has been taking his immunosuppression regularly as prescribed. With respect to his functional status, he denies significant SOB, chest pain, or other exertional symptoms. He is ambulating with a rolling walker. LFTs are normal today. WBC is stable but low at 2.6k today (ANC 1.8k).  INR is supra-therapeutic on warfarin.       Past Medical History    Past Medical History:   Diagnosis Date   ??? Cancer (CMS-HCC)    ??? Cirrhosis (CMS-HCC)    ??? Diabetes (CMS-HCC)    ??? Hepatitis C 07/17/2012   ??? Liver disease    ??? Low back pain 07/17/2012   ??? Varices, esophageal (CMS-HCC)          Past Surgical History    Past Surgical History:   Procedure Laterality Date   ??? ANKLE SURGERY     ??? BACK SURGERY     ??? BACK SURGERY     ??? CHG US GUIDE, TISSUE ABLATION N/A 01/22/2016    Procedure: ULTRASOUND GUIDANCE FOR, AND MONITORING OF, PARENCHYMAL TISSUE ABLATION;  Surgeon: Particia Nearing, MD;  Location: MAIN OR [facility];  Service: Transplant   ??? IR EMBOLIZATION ORGAN ISCHEMIA, TUMORS, INFAR  06/16/2016    IR EMBOLIZATION ORGAN ISCHEMIA, TUMORS, INFAR 06/16/2016 Hyeon  Cathie Hoops, MD IMG VIR H&V [facility]   ??? PR COLON CA SCRN NOT HI RSK IND N/A 02/27/2015    Procedure: COLOREC CNCR SCR;COLNSCPY NO;  Surgeon: Vonda Antigua, MD;  Location: GI PROCEDURES [facility]OR[facility];  Service: Gastroenterology   ??? PR ENDOSCOPIC ULTRASOUND EXAM N/A 02/27/2015    Procedure: UGI ENDO; W/ENDO ULTRASOUND EXAM INCLUDES ESOPHAGUS, STOMACH, &/OR DUODENUM/JEJUNUM;  Surgeon: Vonda Antigua, MD;  Location: GI PROCEDURES [facility];  Service: Gastroenterology ??? PR ERCP STENT PLACEMENT BILIARY/PANCREATIC DUCT N/A 11/29/2016    Procedure: ENDOSCOPIC RETROGRADE CHOLANGIOPANCREATOGRAPHY (ERCP); WITH PLACEMENT OF ENDOSCOPIC STENT INTO BILIARY OR PANCREATIC DUCT;  Surgeon: Chriss Driver, MD;  Location: GI PROCEDURES [facility];  Service: Gastroenterology   ??? PR ERCP,W/REMOVAL STONE,BIL/PANCR DUCTS N/A 11/29/2016    Procedure: ERCP; W/ENDOSCOPIC RETROGRADE REMOVAL OF CALCULUS/CALCULI FROM BILIARY &/OR PANCREATIC DUCTS;  Surgeon: Chriss Driver, MD;  Location: GI PROCEDURES [facility];  Service: Gastroenterology   ??? PR INSER HEART TEMP PACER ONE CHMBR N/A 10/02/2016    Procedure: Tempoarary Pacemaker Insertion;  Surgeon: Meredith Leeds, MD;  Location: [facility];  Service: Cardiology   ??? PR LAP,DIAGNOSTIC ABDOMEN N/A 01/22/2016    Procedure: Laparoscopy, Abdomen, Peritoneum, & Omentum, Diagnostic, W/Wo Collection Specimen(S) By Brushing Or Washing;  Surgeon: Particia Nearing, MD;  Location: MAIN OR [facility];  Service: Transplant   ??? PR PLACE PERCUT GASTROSTOMY TUBE N/A 11/17/2016    Procedure: UGI ENDO; W/DIRECTED PLCMT PERQ GASTROSTOMY TUBE;  Surgeon: Cletis Athens, MD;  Location: GI PROCEDURES [facility];  Service: Gastroenterology   ??? PR TRACHEOSTOMY, PLANNED N/A 09/29/2016    Procedure: TRACHEOSTOMY PLANNED (SEPART PROC);  Surgeon: Katherina Mires, MD;  Location: MAIN OR [facility];  Service: Trauma   ??? PR TRANSCATH INSERT OR REPLACE LEADLESS PM VENTR N/A 10/11/2016    Procedure: Pacemaker Implant/Replace Leadless;  Surgeon: Meredith Leeds, MD;  Location: [facility];  Service: Cardiology   ??? PR TRANSPLANT LIVER,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    Procedure: Liver Allotransplantation; Orthotopic, Partial Or Whole, From Cadaver Or Living Donor, Any Age;  Surgeon: Doyce Loose, MD;  Location: MAIN OR [facility];  Service: Transplant   ??? PR TRANSPLANT,PREP DONOR LIVER, WHOLE N/A 09/15/2016    Procedure: Rogelia Boga Std Prep Cad Donor Whole Liver Gft Prior Tnsplnt,Inc Chole,Diss/Rem Surr Tissu Wo Triseg/Lobe Splt;  Surgeon: Doyce Loose, MD;  Location: MAIN OR [facility];  Service: Transplant   ??? PR UPPER GI ENDOSCOPY,BIOPSY N/A 07/17/2012    Procedure: UGI ENDOSCOPY; WITH BIOPSY, SINGLE OR MULTIPLE;  Surgeon: Alba Destine, MD;  Location: GI PROCEDURES [facility];  Service: Gastroenterology   ??? PR UPPER GI ENDOSCOPY,DIAGNOSIS N/A 02/04/2014    Procedure: UGI ENDO, INCLUDE ESOPHAGUS, STOMACH, & DUODENUM &/OR JEJUNUM; DX W/WO COLLECTION SPECIMN, BY BRUSH OR WASH;  Surgeon: Wilburt Finlay, MD;  Location: GI PROCEDURES [facility];  Service: Gastroenterology   ??? PR UPPER GI ENDOSCOPY,LIGAT VARIX N/A 11/05/2013    Procedure: UGI ENDO; W/BAND LIG ESOPH &/OR GASTRIC VARICES;  Surgeon: Wilburt Finlay, MD;  Location: GI PROCEDURES [facility];  Service: Gastroenterology         MEDICATIONS:  Current Outpatient Prescriptions   Medication Sig Dispense Refill   ??? acetaminophen (TYLENOL) 160 mg/5 mL solution Take 10.2 mL (325 mg total) by mouth every four (4) hours as needed for pain. 300 mL 0   ??? amiodarone (PACERONE) 200 MG tablet 1 tablet (200 mg total) by G-tube route daily. 30 tablet 11   ???  aspirin 81 MG chewable tablet 1 tablet (81 mg total) by G-tube route daily. 30 tablet 0   ??? blood sugar diagnostic (FREESTYLE TEST) Strp by Other route Four (4) times a day (before meals and nightly). 100 each 11   ??? blood-glucose meter (GLUCOSE MONITORING KIT) kit Use as instructed 1 each 0   ??? cholecalciferol, vitamin D3, 2,000 unit cap 1 capsule (2,000 Units total) by PEG Tube route daily. 30 capsule 11   ??? cycloSPORINE (NEORAL) 100 mg/mL microemulsion solution 2.3 mL (230 mg total) by G-tube route Two (2) times a day. 150 mL 11   ??? digoxin (LANOXIN) 125 mcg tablet 0.5 tablets (62.5 mcg total) by G-tube route daily. 15 tablet 11   ??? docusate (COLACE) 50 mg/5 mL liquid 10 mL (100 mg total) by G-tube route two (2) times a day as needed. (Patient taking differently: 10 mL by G-tube route two (2) times a day as needed. constipation) 600 mL 0   ??? insulin ASPART (NOVOLOG FLEXPEN U-100 INSULIN) 100 unit/mL injection pen Inject 0.04 mL (4 Units total) under the skin Three (3) times a day before meals. 4 mL 11   ??? insulin ASPART (NOVOLOG FLEXPEN U-100 INSULIN) 100 unit/mL injection pen Inject 0-0.12 mL (0-12 Units total) under the skin Four (4) times a day (before meals and nightly). Sliding scale. 14.4 mL 0   ??? insulin glargine (LANTUS) 100 unit/mL (3 mL) injection pen Inject 0.12 mL (12 Units total) under the skin nightly. 4 mL 11   ??? lancets Misc 1 each by Miscellaneous route Four (4) times a day (before meals and nightly). 100 each 11   ??? magnesium oxide (MAG-OX) 400 mg (241.3 mg magnesium) tablet 1 tablet (400 mg total) by G-tube route Two (2) times a day. 60 tablet 11   ??? melatonin 3 mg Tab 1-2 tablets (3-6 mg total) by PEG Tube route nightly. 60 tablet 0   ??? mycophenolate (CELLCEPT) 200 mg/mL suspension 2.5 mL (500 mg total) by G-tube route Two (2) times a day (8am and 8pm). 175 mL 12   ??? pen needle, diabetic (ULTICARE PEN NEEDLE) 32 gauge x 5/32 Ndle 1 each by Miscellaneous route Four (4) times a day (before meals and nightly). 90 each 3   ??? polyethylene glycol (MIRALAX) 17 gram packet 17 g by G-tube route daily as needed. (Patient taking differently: 17 g by G-tube route daily as needed. constipation) 24 each 0   ??? sildenafil, antihypertensive, (REVATIO) 20 mg tablet 1.5 tablets (30 mg total) by G-tube route Three (3) times a day. Dissolve 1 1/2 tablets in water and administer via PEG tube. 135 tablet 0   ??? sulfamethoxazole-trimethoprim (BACTRIM,SEPTRA) 200-40 mg/5 mL suspension 10 mL by G-tube route 3 (three) times a week. 360 mL 0   ??? thiamine (B-1) 100 MG tablet 1 tablet (100 mg total) by G-tube route daily. 30 tablet 11   ??? valGANciclovir (VALCYTE) 50 mg/mL SolR 9 mL (450 mg total) by PEG Tube route daily. 270 mL 2   ??? warfarin (COUMADIN) 1 MG tablet 4 mg with dinner Take one 3 mg pill and one 1 mg pill. 30 tablet 11   ??? warfarin (COUMADIN) 3 MG tablet 1 tablet (3 mg total) by G-tube route daily with evening meal. 30 tablet 11     No current facility-administered medications for this visit.        ALLERGIES:  Patient has no known allergies.      Review of Systems  A 12 point review of systems was negative except for pertinent items noted in the HPI.       Physical Exam:   Blood pressure 157/76, pulse 73, temperature 35.7 ??C, temperature source Tympanic, height 172.7 cm (5' 7.99), weight 63.5 kg (140 lb).  Body mass index is 21.29 kg/m??.    General - well appearing  Neuro - alert, oriented, and interactive  Resp - breathing comfortably on room air  CV - sinus rhythm, hemodynamically normal/stable  Abd - soft, nondistended, nontender; Mercedes incision c/d/i, 32F G-tube in place.  Ext - warm and well perfused, minimal pedal edema, uses walker    LABS:     Lab Results   Component Value Date    WBC 2.6 (L) 12/26/2016    HGB 12.3 (L) 12/26/2016    HCT 36.7 (L) 12/26/2016    PLT 145 (L) 12/26/2016     Lab Results   Component Value Date    NA 137 12/26/2016    K 4.5 12/26/2016    CL 104 12/26/2016    CO2 23.0 12/26/2016    BUN 21 12/26/2016    CREATININE 1.03 12/26/2016    CALCIUM 9.6 12/26/2016    MG 1.6 12/26/2016    PHOS 2.8 (L) 12/26/2016     Lab Results   Component Value Date    APTT 39.8 (H) 12/20/2016     Lab Results   Component Value Date    ALKPHOS 182 (H) 12/26/2016    BILITOT 1.1 12/26/2016    BILIDIR 0.40 12/26/2016    PROT 6.7 12/26/2016    ALBUMIN 4.1 12/26/2016    ALT 42 12/26/2016    AST 32 12/26/2016    GGT 170 (H) 12/26/2016       Imaging:     No results found.

## 2016-12-26 NOTE — Unmapped (Signed)
ONBOARDING - LAST FILLED AT [facility] 12/26/16

## 2016-12-26 NOTE — Unmapped (Signed)
[facility] TRANS[facility] PHARMACY NOTE  12/26/2016   Jeffrey Ward  [id number]    Medication changes today:   1. Reduced cyclosporine goal to 150-200 as patient is ~ 3 months post-transplant.  2. Converted cyclosporine from 230 mg (2.3 mL) via PEG tube BID to 250 mg PO BID.  3. Stopped Valcyte as patient is > 3 months post transplant.  4. Decreased warfarin dose from 4 mg nightly to 3 mg nightly due to supratherapeutic INR.  5. Instructed patient to no longer crush pills,  now be swallowed whole.    Education/Adherence tools provided today:  1.provided additional education on immunosuppression and transplant related medications including reviewing indications of medications, dosing and side effects  2. provided updated medication list   3. Facilitated medication access to cyclosporine.    Follow up items:  1. goal of understanding indications and dosing of immunosuppression medications   2. Follow up with patient's home BG levels and BP readings as patient forgot logs today.  3. Follow up on patient's ability to take medications PO, consider converting remaining liquids to tablet/capsule formulation.  4. Continue to monitor digoxin levels  5. Continue to monitor serum K and Mg with goal > 4.0 and 2.0, respectively.  6. Consider statin therapy at future clinic visit  7. Continue to monitor TSH and Free T4 every 3 months - consider referral to endocrine for further thyroid dysfunction work up.    Next visit with pharmacy in 1-2 weeks  ____________________________________________________________________    Jeffrey Ward is a 66 y.o. 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     Other PMH significant for diabetes.    HCV History: Chronic HCV, genotype 1A. Treated with Harvoni for 24 weeks. Start date: 04/24/2013. End date: 10/09/2013. SVR achieved.  ??  Post-op course complicated by cardiogenic shock due to right heart failure, a.fib/a.flutter, pulmonary hypertension, AKI requiring CRRT (now resolved), s/p permanent pacemaker placement, PEG placement on 11/17/16 for dysphagia.  ??  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 Patient did receives steroids at this time for presumed rejection.    Seen by pharmacy today for: medication management and adherence education; last seen by pharmacy first visit     CC:  Patient has no complaints today     Vitals:    12/26/16 0957   BP: 157/76   Pulse: 73   Temp: 35.7 ??C (96.3 ??F)       No Known Allergies    All medications reviewed and updated.     Medication list includes revisions made during today???s encounter    Outpatient Encounter Prescriptions as of 12/26/2016   Medication Sig Dispense Refill   ??? acetaminophen (TYLENOL) 160 mg/5 mL solution Take 10.2 mL (325 mg total) by mouth every four (4) hours as needed for pain. 300 mL 0   ??? amiodarone (PACERONE) 200 MG tablet 1 tablet (200 mg total) by G-tube route daily. 30 tablet 11   ??? aspirin 81 MG chewable tablet 1 tablet (81 mg total) by G-tube route daily. 30 tablet 0   ??? blood sugar diagnostic (FREESTYLE TEST) Strp by Other route Four (4) times a day (before meals and nightly). 100 each 11   ??? blood-glucose meter (GLUCOSE MONITORING KIT) kit Use as instructed 1 each 0   ??? cholecalciferol, vitamin D3, 2,000 unit cap 1 capsule (2,000 Units total) by PEG Tube route  daily. 30 capsule 11   ??? digoxin (LANOXIN) 125 mcg tablet 0.5 tablets (62.5 mcg total) by G-tube route daily. 15 tablet 11   ??? docusate (COLACE) 50 mg/5 mL liquid 10 mL (100 mg total) by G-tube route two (2) times a day as needed. (Patient taking differently: 10 mL by G-tube route two (2) times a day as needed. constipation) 600 mL 0   ??? insulin ASPART (NOVOLOG FLEXPEN U-100 INSULIN) 100 unit/mL injection pen Inject 0.04 mL (4 Units total) under the skin Three (3) times a day before meals. 4 mL 11   ??? insulin ASPART (NOVOLOG FLEXPEN U-100 INSULIN) 100 unit/mL injection pen Inject 0-0.12 mL (0-12 Units total) under the skin Four (4) times a day (before meals and nightly). Sliding scale. 14.4 mL 0   ??? insulin glargine (LANTUS) 100 unit/mL (3 mL) injection pen Inject 0.12 mL (12 Units total) under the skin nightly. 4 mL 11   ??? lancets Misc 1 each by Miscellaneous route Four (4) times a day (before meals and nightly). 100 each 11   ??? magnesium oxide (MAG-OX) 400 mg (241.3 mg magnesium) tablet 1 tablet (400 mg total) by G-tube route Three (3) times a day. 90 tablet 11   ??? melatonin 3 mg Tab 1-2 tablets (3-6 mg total) by PEG Tube route nightly. 60 tablet 0   ??? mycophenolate (CELLCEPT) 200 mg/mL suspension 2.5 mL (500 mg total) by G-tube route Two (2) times a day (8am and 8pm). 175 mL 12   ??? NEORAL 100 mg capsule Take 200 mg in addition to 50 mg for total daily dose of 250 mg bid 60 capsule 11   ??? NEORAL 25 mg capsule Take 50 mg in addition to 200 mg for total daily dose of 250 mg bid 120 capsule 11   ??? omeprazole (PRILOSEC) 20 MG capsule Take 1 capsule (20 mg total) by mouth daily. 30 capsule 1   ??? pen needle, diabetic (ULTICARE PEN NEEDLE) 32 gauge x 5/32 Ndle 1 each by Miscellaneous route Four (4) times a day (before meals and nightly). 90 each 3   ??? polyethylene glycol (MIRALAX) 17 gram packet 17 g by G-tube route daily as needed. (Patient taking differently: 17 g by G-tube route daily as needed. constipation) 24 each 0   ??? sildenafil, antihypertensive, (REVATIO) 20 mg tablet 1.5 tablets (30 mg total) by G-tube route Three (3) times a day. Dissolve 1 1/2 tablets in water and administer via PEG tube. 135 tablet 0   ??? sulfamethoxazole-trimethoprim (BACTRIM,SEPTRA) 200-40 mg/5 mL suspension 10 mL by G-tube route 3 (three) times a week. 360 mL 0   ??? thiamine (B-1) 100 MG tablet 1 tablet (100 mg total) by G-tube route daily. 30 tablet 11   ??? [EXPIRED] traMADol (ULTRAM) 50 mg tablet Take 1-2 tablets (50-100 mg total) by mouth every six (6) hours as needed. for up to 5 days 40 tablet 0   ??? warfarin (COUMADIN) 1 MG tablet HOLD (1 mg tablets) - Take 3 mg by mouth every evening. 30 tablet 11   ??? warfarin (COUMADIN) 3 MG tablet 1 tablet (3 mg total) by G-tube route daily with evening meal. 30 tablet 11   ??? [DISCONTINUED] cycloSPORINE (NEORAL) 100 mg/mL microemulsion solution 2.3 mL (230 mg total) by G-tube route Two (2) times a day. 150 mL 11   ??? [DISCONTINUED] magnesium oxide (MAG-OX) 400 mg (241.3 mg magnesium) tablet 1 tablet (400 mg total) by G-tube route Two (2) times a day. 60  tablet 11   ??? [DISCONTINUED] valGANciclovir (VALCYTE) 50 mg/mL SolR 9 mL (450 mg total) by PEG Tube route daily. 270 mL 2   ??? [DISCONTINUED] warfarin (COUMADIN) 1 MG tablet 4 mg with dinner Take one 3 mg pill and one 1 mg pill. 30 tablet 11     No facility-administered encounter medications on file as of 12/26/2016.      Induction agent : steroids only    CURRENT IMMUNOSUPPRESSION:   cyclosporine 230 mg (2.3 mL) via PEG tube BID, cyclosporine goal: 200-250   cellcept 500 mg (2.5 mL) via PEG tube BID   steroid free    Patient complains of tremor and headaches. He reports headaches seem to respond to acetaminophen. Patient's wife reports they ran out of cyclosporine this AM (1 missed dose 10/15 AM). Per [facility], patient requested medication ahead of time and medication was mailed, but was delayed due to weather and would arrive this afternoon. To facilitate medication access and avoid a missed dose this evening, cyclosporine capsules (225 mg) were ordered and sent to [facility] for pick up after their clinic visit. Pt reports that he is eating a full diet and has no problems swallowing.     10/21/16 - patient converted from tacrolimus to cyclosporine due to neurotoxicity (jerks, tremors).    IMMUNOSUPPRESSION DRUG LEVELS:  Lab Results   Component Value Date    CYCLO 65 12/26/2016    CYCLO 127 12/22/2016    CYCLO 175 12/19/2016     Prograf level is accurate 12 hour trough.    Graft function: improving  DSA: ntd  Biopsies to date: 11/04/2016 - no rejection indicated  WBC/ANC:  2/6/1.8    Plan: Cyclosporine was changed to capsules at todays visit to facilitate medication access. Patient is likely able to transition to more PO medications at future visits. Cyclosporine goal was reduced to 150-200; however dose will be increased to 250 mg BID due to subtherapeutic level.    ID prophylaxis:   CMV Status: D+/ R+, moderate risk . CMV prophylaxis: valganciclovir 450 mg daily x 3 months per protocol.  No results found for: CMVCP  PCP Prophylaxis: bactrim SS 1 tab MWF x 6 months. Stop date 03/18/2017.  Patient is  tolerating infectious prophylaxis well    Plan: Continue Bactrim per protocol. Discontinue Valcyte per protocol as patient is > 3 months post-transplant.    CV Prophylaxis: asa 81 mg   The 10-year ASCVD risk score Denman George DC Jr., et al., 2013) is: 33.8%  Statin therapy: Indicated; consider statin initiation at future visit as patient is indicated for high intensity statin therapy. Therapy not initiated today as patient and wife appeared overwhelmed by current medication regimen.    BP: Goal < 140/90. Clinic vitals reported above  Home BP ranges: Patient unable to report - BP log at home  Current meds include: None at this time.  Plan: Will follow up with patient via telephone to discuss home BP readings. Patient was 157/76 today in clinic.    Atrial fibrillation:  Medications currently on: amiodarone 200 mg daily, digoxin 62.5 mcg daily and warfarin 4 mg qHS  Digoxin level: 0.4 (10/15)  INR: 3.25, goal 2-3  TSH: 8.26, Free T4: 1.98  Plan:  Decrease warfarin from 4 mg qHS to 3 mg qHS in response to supratherapeutic INR.  Continue current digoxin dose despite subtherapeutic level as afib is currently controlled  Continue to monitor serum K and Mg and replace up  to 4.0 and 2.0, respectively, to reduce risk of arrhythmia.  Consider endocrine referral for potential amiodarone-induced thyroid dysfunction.  Continue to monitor TSH and free T4 every 3 months and PFTs/CXR annually. Encourage annual eye exam.    Pulmonary Hypertension:  Meds currently on: sildenafil 30 mg TID  Plan: Continue to monitor.    Anemia:  H/H:   Lab Results   Component Value Date    HGB 12.3 (L) 12/26/2016     Lab Results   Component Value Date    HCT 36.7 (L) 12/26/2016     Iron panel:  Lab Results   Component Value Date    IRON 169 (H) 11/24/2015    TIBC 190.8 (L) 11/24/2015    FERRITIN 286.0 11/24/2015     Lab Results   Component Value Date    LABIRON 89 (H) 11/24/2015     Prior ESA use: NTD  Plan: within goal. Continue to monitor.     DM:   Lab Results   Component Value Date    A1C 5.1 12/26/2016   Goal A1c < 7  History of Dm? 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 Will confirm with patient when follow up on home BG levels  Currently on: Lantus 12 units qHS, Novolog 4 units with meals + sliding scale insulin  Home BS log: Patient did not bring BG log to clinic today  Hypoglycemia: no  Plan:  Will follow up with patient regarding home BG levels.    Electrolytes: K: 4.5, Mg: 1.6  Meds currently on: Magnesium oxide 400 mg BID  Plan: Increase Magnesium to 400 mg TID (patient is already taking other medications TID) as pts Mg goal is greater than or equal to 2.0 to reduce risk of arrhythmia.    BM: Patient is having between 1-3 BM daily, BM are not formed but are not described as diarrhea  Meds currently on: Patient has docusate and Miralax at home but is not currently using.  Plan: Continue to monitor - especially with increase in magnesium dose.    Pain: Patient complains pain requiring 1-2 APAP throughout the day and a tramadol at night.  Meds currently on: Acetaminophen 325 mg PRN, tramadol 50 mg PRN  Plan: Encouraged patient to use APAP if able to at night. Educated on maximum dose of APAP.    Bone health:   Vitamin D Level: last level is 34.2. Goal > 30.   Last DEXA results: 11/30/2015 scan indicated low bone density  Current meds include: cholecalciferol 2,000 units daily  Plan: Vitamin D level  within goal,continue supplementation. Continue to monitor.     Heartburn: Patient complains of frequent heartburn  Meds currently on: omeprazole 20 mg capsules  Plan: Wife has been opening capsule and dissolving in water prior to administering via G-tube. Suggested patient attempt to take PO (as he is eating and will be taking other medications PO).    Insomnia:  Meds currently on: melatonin 3-6 mg nightly  Plan: Patient and wife report melatonin has helped with sleep. Continue current therapy.    Women's/Men's Health:  Jeffrey Ward is a 66 y.o. male. Patient reports no men's/women's health issues  Plan: Continue to monitor.    Dysphagia: pt has been crushing all tablets and getting liquid formulation of meds d/t difficulty with swallowing .   Pt reports that he is eating a full diet and has not had any problems swallowing.   Pt and wife have requested  that they start converting back to pill formulation.   Pt was instructed to stop crushing all tablets and start swallowing them whole. If tolerates this well, will send rx to convert the rest of the liquid formulation medications     Adherence: Patient has poor understanding of medications; was not able to independently identify names/doses of immunosuppressants and OI meds. Patient's wife primarily manages medications and she has an average understanding of his medications.  Patient  does not fill their own pill box on a regular basis at home. Wife fills pillbox with the assistance of a home health nurse.  Patient brought medication card:yes, however medication card was a previous version. Requested patient bring the current version to future clinic visits.  Pill box:did not bring as several medications are liquid - as patient transitions to more oral medications recommend checking pillbox fill accuracy. Patient requested refills for the following meds: None  Corrections needed in Epic medication list: None   Plan: Patient and wife are currently overwhelmed by the number of medications. Primarily spent time re-assuring patient's wife and ensuring medication therapy is appropriate at today's visit. Patient and wife would likely benefit from repeat medication education with the goal of learning more about each medication at a future visit; provided extensive adherence counseling/intervention      Spent approximately 50 minutes on educating this patient and greater than 50% was spent in direct face to face counseling regarding post transplant medication education. Questions and concerns were address to patient's satisfaction.    Patient was reviewed with Dr.Desai who was agreement with the stated plan:     During this visit, the following was completed:   BG log data assessment  BP log data assessment  Labs ordered and evaluated  complex treatment plan >1 DS    Patient education was completed for 25-60 minutes     All questions/concerns were addressed to the patient's satisfaction.    Joycelyn Schmid, PharmD  PGY2 Solid Organ Transplant Resident  Pager: [phone]  __________________________________________  PATIENT SEEN AND EVALUATED [id number] Vito Backers, CPPSOLID ORGAN TRANSPLANTPAGER [phone]

## 2016-12-27 DIAGNOSIS — Z8619 Personal history of other infectious and parasitic diseases: Secondary | ICD-10-CM

## 2016-12-27 DIAGNOSIS — I4891 Unspecified atrial fibrillation: Secondary | ICD-10-CM

## 2016-12-27 DIAGNOSIS — N400 Enlarged prostate without lower urinary tract symptoms: Secondary | ICD-10-CM

## 2016-12-27 DIAGNOSIS — E1142 Type 2 diabetes mellitus with diabetic polyneuropathy: Secondary | ICD-10-CM

## 2016-12-27 DIAGNOSIS — Z950 Presence of cardiac pacemaker: Secondary | ICD-10-CM

## 2016-12-27 DIAGNOSIS — E46 Unspecified protein-calorie malnutrition: Secondary | ICD-10-CM

## 2016-12-27 DIAGNOSIS — D649 Anemia, unspecified: Secondary | ICD-10-CM

## 2016-12-27 DIAGNOSIS — Z431 Encounter for attention to gastrostomy: Secondary | ICD-10-CM

## 2016-12-27 DIAGNOSIS — K219 Gastro-esophageal reflux disease without esophagitis: Secondary | ICD-10-CM

## 2016-12-27 DIAGNOSIS — G894 Chronic pain syndrome: Secondary | ICD-10-CM

## 2016-12-27 DIAGNOSIS — Z8505 Personal history of malignant neoplasm of liver: Secondary | ICD-10-CM

## 2016-12-27 DIAGNOSIS — Z9181 History of falling: Secondary | ICD-10-CM

## 2016-12-27 DIAGNOSIS — M961 Postlaminectomy syndrome, not elsewhere classified: Secondary | ICD-10-CM

## 2016-12-27 DIAGNOSIS — Z6821 Body mass index (BMI) 21.0-21.9, adult: Secondary | ICD-10-CM

## 2016-12-27 DIAGNOSIS — Z7982 Long term (current) use of aspirin: Secondary | ICD-10-CM

## 2016-12-27 DIAGNOSIS — Z794 Long term (current) use of insulin: Secondary | ICD-10-CM

## 2016-12-27 DIAGNOSIS — I272 Pulmonary hypertension, unspecified: Secondary | ICD-10-CM

## 2016-12-27 DIAGNOSIS — Z4823 Encounter for aftercare following liver transplant: Principal | ICD-10-CM

## 2016-12-27 NOTE — Unmapped (Signed)
Per PharmD Nedra Hai sent cariology team message enquiring when the next cardiology appointment is needed.    Also per PharmD Nedra Hai patient to take Neoral 250 mg BID of the pills.  Confirmed with wife this.  She stated yesterday am he missed his dose of medication and only took 150 mg last night and this morning.  Re-educated about the dose needing to be 250 mg.  She verbalized understanding.

## 2016-12-27 NOTE — Unmapped (Signed)
Called and spoke with patients wife to gather home blood pressure and blood glucose values as patient and wife were unable to report in clinic yesterday.    Home BP log:   BP   12/19/2016 138/80   12/20/2016    12/21/2016    12/22/2016 145/70   12/23/2016 132/69   12/24/2016 148/70   12/25/2016    12/26/2016    12/27/2016 115/65     Medications currently on: None.  Goal < 140/80  Plan: Continue to monitor at future clinic visits.    Home BG log:   Breakfast   12/19/2016 78   12/20/2016    12/21/2016 179   12/22/2016 124   12/23/2016 124   12/24/2016 126   12/25/2016 145   12/26/2016    12/27/2016 146     Min 78   Max 179   Number 7   Avg 132   SD 31     Medications currently ordered: Lantus 12 units nightly, Novolog 4 units with each meal + sliding scale insulin  Patient's wife reports she has only been testing his BG before breakfast and only administering Novolog 4 units with breakfast (without sliding scale insulin). She has also been administering Lantus 12 units nightly.   Plan: Asked patient's wife to test before each meal (including lunch and dinner), to administer Novolog 4 units with each meal, and to bring blood glucose values to next clinic visit.      Of note, patient's wife reports that the doctor who used to manage his insulin has left the practice. They would like to stay in the [facility] system for DM management if possible and request a referral to endocrine for future follow-up.      Joycelyn Schmid, PharmD  PGY2 Solid Organ Transplant Resident  Pager: [phone]

## 2016-12-28 DIAGNOSIS — M961 Postlaminectomy syndrome, not elsewhere classified: Secondary | ICD-10-CM

## 2016-12-28 DIAGNOSIS — Z8619 Personal history of other infectious and parasitic diseases: Secondary | ICD-10-CM

## 2016-12-28 DIAGNOSIS — G894 Chronic pain syndrome: Secondary | ICD-10-CM

## 2016-12-28 DIAGNOSIS — Z7982 Long term (current) use of aspirin: Secondary | ICD-10-CM

## 2016-12-28 DIAGNOSIS — K219 Gastro-esophageal reflux disease without esophagitis: Secondary | ICD-10-CM

## 2016-12-28 DIAGNOSIS — I272 Pulmonary hypertension, unspecified: Secondary | ICD-10-CM

## 2016-12-28 DIAGNOSIS — N400 Enlarged prostate without lower urinary tract symptoms: Secondary | ICD-10-CM

## 2016-12-28 DIAGNOSIS — Z6821 Body mass index (BMI) 21.0-21.9, adult: Secondary | ICD-10-CM

## 2016-12-28 DIAGNOSIS — Z4823 Encounter for aftercare following liver transplant: Principal | ICD-10-CM

## 2016-12-28 DIAGNOSIS — D649 Anemia, unspecified: Secondary | ICD-10-CM

## 2016-12-28 DIAGNOSIS — Z431 Encounter for attention to gastrostomy: Secondary | ICD-10-CM

## 2016-12-28 DIAGNOSIS — E1142 Type 2 diabetes mellitus with diabetic polyneuropathy: Secondary | ICD-10-CM

## 2016-12-28 DIAGNOSIS — Z8505 Personal history of malignant neoplasm of liver: Secondary | ICD-10-CM

## 2016-12-28 DIAGNOSIS — Z9181 History of falling: Secondary | ICD-10-CM

## 2016-12-28 DIAGNOSIS — E46 Unspecified protein-calorie malnutrition: Secondary | ICD-10-CM

## 2016-12-28 DIAGNOSIS — Z794 Long term (current) use of insulin: Secondary | ICD-10-CM

## 2016-12-28 DIAGNOSIS — I4891 Unspecified atrial fibrillation: Secondary | ICD-10-CM

## 2016-12-28 DIAGNOSIS — Z950 Presence of cardiac pacemaker: Secondary | ICD-10-CM

## 2016-12-29 DIAGNOSIS — N400 Enlarged prostate without lower urinary tract symptoms: Secondary | ICD-10-CM

## 2016-12-29 DIAGNOSIS — E46 Unspecified protein-calorie malnutrition: Secondary | ICD-10-CM

## 2016-12-29 DIAGNOSIS — Z8505 Personal history of malignant neoplasm of liver: Secondary | ICD-10-CM

## 2016-12-29 DIAGNOSIS — I4891 Unspecified atrial fibrillation: Secondary | ICD-10-CM

## 2016-12-29 DIAGNOSIS — Z6821 Body mass index (BMI) 21.0-21.9, adult: Secondary | ICD-10-CM

## 2016-12-29 DIAGNOSIS — E1142 Type 2 diabetes mellitus with diabetic polyneuropathy: Secondary | ICD-10-CM

## 2016-12-29 DIAGNOSIS — I272 Pulmonary hypertension, unspecified: Secondary | ICD-10-CM

## 2016-12-29 DIAGNOSIS — Z4823 Encounter for aftercare following liver transplant: Principal | ICD-10-CM

## 2016-12-29 DIAGNOSIS — M961 Postlaminectomy syndrome, not elsewhere classified: Secondary | ICD-10-CM

## 2016-12-29 DIAGNOSIS — Z794 Long term (current) use of insulin: Secondary | ICD-10-CM

## 2016-12-29 DIAGNOSIS — Z431 Encounter for attention to gastrostomy: Secondary | ICD-10-CM

## 2016-12-29 DIAGNOSIS — G894 Chronic pain syndrome: Secondary | ICD-10-CM

## 2016-12-29 DIAGNOSIS — Z7982 Long term (current) use of aspirin: Secondary | ICD-10-CM

## 2016-12-29 DIAGNOSIS — Z9181 History of falling: Secondary | ICD-10-CM

## 2016-12-29 DIAGNOSIS — K219 Gastro-esophageal reflux disease without esophagitis: Secondary | ICD-10-CM

## 2016-12-29 DIAGNOSIS — D649 Anemia, unspecified: Secondary | ICD-10-CM

## 2016-12-29 DIAGNOSIS — Z8619 Personal history of other infectious and parasitic diseases: Secondary | ICD-10-CM

## 2016-12-29 DIAGNOSIS — Z950 Presence of cardiac pacemaker: Secondary | ICD-10-CM

## 2016-12-29 NOTE — Unmapped (Signed)
Per PharmD Nedra Hai both endocrinologist referral (for elevated TSH and Free T4) and cardiologist referral (for A-fib) need to be placed for patient to have follow up.  Main concern right now is possible amiodarone induced elevation of Thyroid hormone levels.      Also emailed with GI Procedure manager and transplant manager to obtain Mic-Key pending plan will schedule follow up transplant appointments.      Left VM letting patient know this information. Placed orders.

## 2016-12-29 NOTE — Unmapped (Signed)
[facility] Refill and Clinical Coordination Note  Medication(s): DIGOXIN, MYCOPHENATE 200/5, SILACE, AMIODARONE    Jeffrey Ward, DOB: 14-Aug-1950  Phone: [phone] (home) , Alternate phone contact: N/A  Shipping address: [address]  [place] [address]  Phone or address changes today?: No  All above HIPAA information verified.  Insurance changes? No    Completed refill and clinical call assessment today to schedule patient's medication shipment from the [facility] [phone]).      MEDICATION RECONCILIATION    Confirmed the medication and dosage are correct and have not changed: Yes, regimen is correct and unchanged.    Were there any changes to your medication(s) in the past month:  No, there are no changes reported at this time.    ADHERENCE    Is this medicine transplant or covered by Medicare Part B? Yes.     Mycophenolate Mofetil LIQUID   Quantity filled last month: 1 BOX   # of tablets left on hand: 7 DAYS      Did you miss any doses in the past 4 weeks? No missed doses reported.  Adherence counseling provided? Not needed     SIDE EFFECT MANAGEMENT    Are you tolerating your medication?:  Jeffrey Ward reports tolerating the medication.  Side effect management discussed: None      Therapy is appropriate and should be continued.    Evidence of clinical benefit: See Epic note from 12/26/16      FINANCIAL/SHIPPING    Delivery Scheduled: Yes, Expected medication delivery date: 01/03/17   Additional medications refilled: No additional medications/refills needed at this time.    Jeffrey Ward did not have any additional questions at this time.    Delivery address validated in [facility] scheduling system: Yes, address listed above is correct.      We will follow up with patient monthly for standard refill processing and delivery.      Thank you,  Jeffrey Ward   [facility] Specialty Pharmacist

## 2016-12-30 ENCOUNTER — Ambulatory Visit: Admission: RE | Admit: 2016-12-30 | Discharge: 2016-12-30 | Disposition: A | Discharge status: Home / Self Care

## 2016-12-30 DIAGNOSIS — E1142 Type 2 diabetes mellitus with diabetic polyneuropathy: Secondary | ICD-10-CM

## 2016-12-30 DIAGNOSIS — Z794 Long term (current) use of insulin: Secondary | ICD-10-CM

## 2016-12-30 DIAGNOSIS — I272 Pulmonary hypertension, unspecified: Secondary | ICD-10-CM

## 2016-12-30 DIAGNOSIS — K219 Gastro-esophageal reflux disease without esophagitis: Secondary | ICD-10-CM

## 2016-12-30 DIAGNOSIS — D649 Anemia, unspecified: Secondary | ICD-10-CM

## 2016-12-30 DIAGNOSIS — Z9181 History of falling: Secondary | ICD-10-CM

## 2016-12-30 DIAGNOSIS — Z431 Encounter for attention to gastrostomy: Secondary | ICD-10-CM

## 2016-12-30 DIAGNOSIS — Z4823 Encounter for aftercare following liver transplant: Principal | ICD-10-CM

## 2016-12-30 DIAGNOSIS — E46 Unspecified protein-calorie malnutrition: Secondary | ICD-10-CM

## 2016-12-30 DIAGNOSIS — G894 Chronic pain syndrome: Secondary | ICD-10-CM

## 2016-12-30 DIAGNOSIS — I4891 Unspecified atrial fibrillation: Secondary | ICD-10-CM

## 2016-12-30 DIAGNOSIS — Z6821 Body mass index (BMI) 21.0-21.9, adult: Secondary | ICD-10-CM

## 2016-12-30 DIAGNOSIS — M961 Postlaminectomy syndrome, not elsewhere classified: Secondary | ICD-10-CM

## 2016-12-30 DIAGNOSIS — Z8619 Personal history of other infectious and parasitic diseases: Secondary | ICD-10-CM

## 2016-12-30 DIAGNOSIS — Z7982 Long term (current) use of aspirin: Secondary | ICD-10-CM

## 2016-12-30 DIAGNOSIS — Z8505 Personal history of malignant neoplasm of liver: Secondary | ICD-10-CM

## 2016-12-30 DIAGNOSIS — N400 Enlarged prostate without lower urinary tract symptoms: Secondary | ICD-10-CM

## 2016-12-30 DIAGNOSIS — Z950 Presence of cardiac pacemaker: Secondary | ICD-10-CM

## 2016-12-30 LAB — CBC W/ AUTO DIFF
BASOPHILS ABSOLUTE COUNT: 0 10*9/L (ref 0.0–0.1)
EOSINOPHILS ABSOLUTE COUNT: 0.1 10*9/L (ref 0.0–0.4)
HEMATOCRIT: 32 % — ABNORMAL LOW (ref 41.0–53.0)
HEMOGLOBIN: 11 g/dL — ABNORMAL LOW (ref 13.5–17.5)
LARGE UNSTAINED CELLS: 2 % (ref 0–4)
LYMPHOCYTES ABSOLUTE COUNT: 0.6 10*9/L — ABNORMAL LOW (ref 1.5–5.0)
MEAN CORPUSCULAR HEMOGLOBIN CONC: 34.3 g/dL (ref 31.0–37.0)
MEAN CORPUSCULAR HEMOGLOBIN: 32.8 pg (ref 26.0–34.0)
MEAN PLATELET VOLUME: 8.8 fL (ref 7.0–10.0)
NEUTROPHILS ABSOLUTE COUNT: 1.1 10*9/L — ABNORMAL LOW (ref 2.0–7.5)
PLATELET COUNT: 102 10*9/L — ABNORMAL LOW (ref 150–440)
RED BLOOD CELL COUNT: 3.34 10*12/L — ABNORMAL LOW (ref 4.50–5.90)
RED CELL DISTRIBUTION WIDTH: 19.8 % — ABNORMAL HIGH (ref 12.0–15.0)
WBC ADJUSTED: 1.9 10*9/L — ABNORMAL LOW (ref 4.5–11.0)

## 2016-12-30 LAB — MAGNESIUM: Magnesium:MCnc:Pt:Ser/Plas:Qn:: 1.6

## 2016-12-30 LAB — COMPREHENSIVE METABOLIC PANEL
ALBUMIN: 3.7 g/dL (ref 3.5–5.0)
ALKALINE PHOSPHATASE: 164 U/L — ABNORMAL HIGH (ref 38–126)
ALT (SGPT): 34 U/L (ref 19–72)
ANION GAP: 6 mmol/L — ABNORMAL LOW (ref 9–15)
AST (SGOT): 29 U/L (ref 19–55)
BILIRUBIN TOTAL: 1 mg/dL (ref 0.0–1.2)
BLOOD UREA NITROGEN: 24 mg/dL — ABNORMAL HIGH (ref 7–21)
BUN / CREAT RATIO: 22
CALCIUM: 9.6 mg/dL (ref 8.5–10.2)
CHLORIDE: 105 mmol/L (ref 98–107)
CREATININE: 1.11 mg/dL (ref 0.70–1.30)
EGFR MDRD AF AMER: >=60 mL/min/{1.73_m2} (ref >=60)
EGFR MDRD NON AF AMER: >=60 mL/min/{1.73_m2} (ref >=60)
GLUCOSE RANDOM: 110 mg/dL — ABNORMAL HIGH (ref 65–99)
POTASSIUM: 4.5 mmol/L (ref 3.5–5.0)
PROTEIN TOTAL: 6 g/dL — ABNORMAL LOW (ref 6.5–8.3)
SODIUM: 137 mmol/L (ref 135–145)

## 2016-12-30 LAB — BILIRUBIN DIRECT: Bilirubin.glucuronidated:MCnc:Pt:Ser/Plas:Qn:: 0.4

## 2016-12-30 LAB — PROTIME-INR: PROTIME: 28.6 s — ABNORMAL HIGH (ref 10.2–12.8)

## 2016-12-30 LAB — PROTIME: Lab: 28.6 — ABNORMAL HIGH

## 2016-12-30 LAB — PHOSPHORUS: Phosphate:MCnc:Pt:Ser/Plas:Qn:: 3.1

## 2016-12-30 LAB — MEAN CORPUSCULAR HEMOGLOBIN CONC: Lab: 34.3

## 2016-12-30 LAB — PROTEIN TOTAL: Protein:MCnc:Pt:Ser/Plas:Qn:: 6 — ABNORMAL LOW

## 2016-12-30 NOTE — Unmapped (Signed)
Placed call to [facility] @ [phone] to request most recent lab results expected from 10/18 - they note patient had visit on Monday and did not have visit yesterday, but does have visit scheduled for today.      They further noted that labs will be courriered to lab facility and will likely arrive for processing this afternoon.      Discussed with HH need for labs to be coordinated on Monday and Thursdays to facilitate results within the business week - they verbalized agreement to coordinate this moving forward.

## 2016-12-31 LAB — CYCLOSPORINE, TROUGH: Lab: 279

## 2017-01-01 MED FILL — DIGOXIN/0.125MG/TABS: DIGOXIN/0.125MG/TABS | 30 days supply | Qty: 15 | Fill #0

## 2017-01-01 NOTE — Unmapped (Signed)
Received page from pt. Called back and spoke to both him and his wife. They reported pt has an episode of n/v earlier today but it has now resolved. Pt was also light headed. Last BP was 106/57 HR 85. Pt denies feeling light headed right now. Temperature is 99 degrees F. Pt stated he feels ok right now but wife was concerned about symptoms he experienced earlier.He denies any signs of bleeding.  Advised him to check vital signs again before bed and let coordinator know if SBP is below 100, or if he starts experiencing symptoms again. Primary coordinator will f/u with pt tomorrow.

## 2017-01-02 ENCOUNTER — Ambulatory Visit: Admission: RE | Admit: 2017-01-02 | Discharge: 2017-01-02 | Disposition: A | Discharge status: Home / Self Care

## 2017-01-02 DIAGNOSIS — Z950 Presence of cardiac pacemaker: Secondary | ICD-10-CM

## 2017-01-02 DIAGNOSIS — E1342 Other specified diabetes mellitus with diabetic polyneuropathy: Secondary | ICD-10-CM

## 2017-01-02 DIAGNOSIS — K219 Gastro-esophageal reflux disease without esophagitis: Secondary | ICD-10-CM

## 2017-01-02 DIAGNOSIS — M961 Postlaminectomy syndrome, not elsewhere classified: Secondary | ICD-10-CM

## 2017-01-02 DIAGNOSIS — D649 Anemia, unspecified: Secondary | ICD-10-CM

## 2017-01-02 DIAGNOSIS — Z8505 Personal history of malignant neoplasm of liver: Secondary | ICD-10-CM

## 2017-01-02 DIAGNOSIS — N400 Enlarged prostate without lower urinary tract symptoms: Secondary | ICD-10-CM

## 2017-01-02 DIAGNOSIS — E46 Unspecified protein-calorie malnutrition: Secondary | ICD-10-CM

## 2017-01-02 DIAGNOSIS — Z431 Encounter for attention to gastrostomy: Secondary | ICD-10-CM

## 2017-01-02 DIAGNOSIS — G894 Chronic pain syndrome: Secondary | ICD-10-CM

## 2017-01-02 DIAGNOSIS — E1142 Type 2 diabetes mellitus with diabetic polyneuropathy: Secondary | ICD-10-CM

## 2017-01-02 DIAGNOSIS — Z6821 Body mass index (BMI) 21.0-21.9, adult: Secondary | ICD-10-CM

## 2017-01-02 DIAGNOSIS — Z4823 Encounter for aftercare following liver transplant: Principal | ICD-10-CM

## 2017-01-02 DIAGNOSIS — Z9181 History of falling: Secondary | ICD-10-CM

## 2017-01-02 DIAGNOSIS — I4891 Unspecified atrial fibrillation: Secondary | ICD-10-CM

## 2017-01-02 DIAGNOSIS — Z8619 Personal history of other infectious and parasitic diseases: Secondary | ICD-10-CM

## 2017-01-02 DIAGNOSIS — Z7982 Long term (current) use of aspirin: Secondary | ICD-10-CM

## 2017-01-02 DIAGNOSIS — Z794 Long term (current) use of insulin: Secondary | ICD-10-CM

## 2017-01-02 DIAGNOSIS — I272 Pulmonary hypertension, unspecified: Secondary | ICD-10-CM

## 2017-01-02 LAB — CBC W/ AUTO DIFF
EOSINOPHILS ABSOLUTE COUNT: 0 10*9/L (ref 0.0–0.4)
HEMATOCRIT: 33.2 % — ABNORMAL LOW (ref 41.0–53.0)
HEMOGLOBIN: 11.2 g/dL — ABNORMAL LOW (ref 13.5–17.5)
LARGE UNSTAINED CELLS: 3 % (ref 0–4)
LYMPHOCYTES ABSOLUTE COUNT: 0.4 10*9/L — ABNORMAL LOW (ref 1.5–5.0)
MEAN CORPUSCULAR HEMOGLOBIN CONC: 33.7 g/dL (ref 31.0–37.0)
MEAN CORPUSCULAR HEMOGLOBIN: 32.7 pg (ref 26.0–34.0)
MEAN CORPUSCULAR VOLUME: 97.1 fL (ref 80.0–100.0)
MEAN PLATELET VOLUME: 8 fL (ref 7.0–10.0)
NEUTROPHILS ABSOLUTE COUNT: 0.7 10*9/L — ABNORMAL LOW (ref 2.0–7.5)
PLATELET COUNT: 70 10*9/L — ABNORMAL LOW (ref 150–440)
RED BLOOD CELL COUNT: 3.42 10*12/L — ABNORMAL LOW (ref 4.50–5.90)
RED CELL DISTRIBUTION WIDTH: 19.1 % — ABNORMAL HIGH (ref 12.0–15.0)
WBC ADJUSTED: 1.2 10*9/L — ABNORMAL LOW (ref 4.5–11.0)

## 2017-01-02 LAB — COMPREHENSIVE METABOLIC PANEL
ALBUMIN: 3.6 g/dL (ref 3.5–5.0)
ALKALINE PHOSPHATASE: 324 U/L — ABNORMAL HIGH (ref 38–126)
ALT (SGPT): 79 U/L — ABNORMAL HIGH (ref 19–72)
ANION GAP: 9 mmol/L (ref 9–15)
AST (SGOT): 33 U/L (ref 19–55)
BILIRUBIN TOTAL: 1.3 mg/dL — ABNORMAL HIGH (ref 0.0–1.2)
BLOOD UREA NITROGEN: 30 mg/dL — ABNORMAL HIGH (ref 7–21)
BUN / CREAT RATIO: 19
CHLORIDE: 102 mmol/L (ref 98–107)
CO2: 25 mmol/L (ref 22.0–30.0)
EGFR MDRD AF AMER: 54 mL/min/{1.73_m2} — ABNORMAL LOW (ref >=60)
EGFR MDRD NON AF AMER: 45 mL/min/{1.73_m2} — ABNORMAL LOW (ref >=60)
GLUCOSE RANDOM: 110 mg/dL (ref 65–179)
POTASSIUM: 5 mmol/L (ref 3.5–5.0)
PROTEIN TOTAL: 6 g/dL — ABNORMAL LOW (ref 6.5–8.3)
SODIUM: 136 mmol/L (ref 135–145)

## 2017-01-02 LAB — SMEAR REVIEW

## 2017-01-02 LAB — RED BLOOD CELL COUNT: Lab: 3.42 — ABNORMAL LOW

## 2017-01-02 LAB — BUN / CREAT RATIO: Urea nitrogen/Creatinine:MRto:Pt:Ser/Plas:Qn:: 19

## 2017-01-02 LAB — BILIRUBIN DIRECT: Bilirubin.glucuronidated:MCnc:Pt:Ser/Plas:Qn:: 0.6 — ABNORMAL HIGH

## 2017-01-02 LAB — MAGNESIUM: Magnesium:MCnc:Pt:Ser/Plas:Qn:: 1.6

## 2017-01-02 LAB — SLIDE REVIEW

## 2017-01-02 LAB — PHOSPHORUS: Phosphate:MCnc:Pt:Ser/Plas:Qn:: 2.9

## 2017-01-02 MED ORDER — MYCOPHENOLATE MOFETIL 200 MG/ML ORAL SUSPENSION
Freq: Two times a day (BID) | GASTROSTOMY | 12 refills | 0.00000 days
Start: 2017-01-02 — End: 2017-01-17

## 2017-01-02 MED ORDER — DOCUSATE SODIUM 50 MG/5 ML ORAL LIQUID
99 refills | 0 days
Start: 2017-01-02 — End: 2017-01-02

## 2017-01-02 MED ORDER — MYCOPHENOLATE MOFETIL 250 MG CAPSULE: 250 mg | capsule | Freq: Two times a day (BID) | 11 refills | 0 days | Status: AC

## 2017-01-02 MED ORDER — SILACE 50 MG/5 ML ORAL LIQUID
PRN refills | 0 days | Status: SS
Start: 2017-01-02 — End: 2017-01-12

## 2017-01-02 MED ORDER — MYCOPHENOLATE MOFETIL 250 MG CAPSULE
ORAL_CAPSULE | Freq: Two times a day (BID) | ORAL | 11 refills | 0.00000 days | Status: CP
Start: 2017-01-02 — End: 2017-01-13

## 2017-01-02 MED FILL — AMIODARONE HCL/200MG/TABS: AMIODARONE HCL/200MG/TABS | 30 days supply | Qty: 30 | Fill #0

## 2017-01-02 MED FILL — MYCOPHENOLATE MOFETIL/200MG/ML/SUSR: MYCOPHENOLATE MOFETIL/200MG/ML/SUSR | 30 days supply | Qty: 1 | Fill #0

## 2017-01-02 NOTE — Unmapped (Signed)
Spoke with home health nurse who saw patient today reported he has not been vomiting, looks good, no fevers.  Stated he was eating fine today.  Spoke with Dr. Matilde Haymaker (making aware of trending down PLT/ANC, tending up LFTs and creatinine) who stated patient to increase fluids, report right aware if he is symptomatic, and we will wait to see what labs look like on Thursday.  Left VM with family and patient to make aware asking for another call back to confirm.    Messaged PharmD Nedra Hai about if there should be any medication intervention for PLT and ANC.

## 2017-01-02 NOTE — Unmapped (Signed)
Patient request for RX refill.

## 2017-01-02 NOTE — Unmapped (Signed)
Per PharmD Polly Cobia ASA due to low PLT count.  Then decrease mycophenolate to 250 mg BID due to low ANC.  Spoke with wife and she did confirme patient is feeling better.  Stated he has not been drinking as much water.  Agreed to get patient on a more consistent medication schedule and to have him increase his fluid intake to 80-100 ounces/day.  Additionally she confirmed the CSA level from Friday drawn at noon but he did not take am medication until after lab draw.  Re-messaged PharmD Nedra Hai to make aware.  Wife agreed to give patient 1.25 mL of liquid MMF which would equal 250 mg of it.  She confirmed with patient he would like to switch to the pill form so patient to get that shipped and give a lower dose via tube with the liquid form until it arrives.  Wife confirmed to stop ASA.  Per PharmD Nedra Hai CMV PCR to be drawn Thursday.  Confirmed with home health agency and with the nurse [facility] RN [phone]) to draw PCR this Thursday.

## 2017-01-02 NOTE — Unmapped (Signed)
Called pt and spoke to his wife checking on him. Pt's wife reported pt is feeling better, tolerated his dinner well, BP117/56 HR 65 with temp 98.9. Pt took Tylenol for a headache. Reminded her that maximum daily dose is 3000 mg which she was aware of. Primary coordinator to f/u with pt tomorrow.

## 2017-01-02 NOTE — Unmapped (Signed)
Left VM requesting call back to discuss on-call discussion and if patient is feeling better and dosage times prior to lab or after of CSA.

## 2017-01-03 LAB — CYCLOSPORINE, TROUGH: Lab: 237

## 2017-01-03 NOTE — Unmapped (Addendum)
Per PharmD Nedra Hai patient to decrease Neoral to 200 mg BID. Left VM to make patient aware and requested call back to confirm they understand. Confirmed wife changed patient dose.

## 2017-01-04 MED ORDER — NEORAL 100 MG CAPSULE
ORAL_CAPSULE | Freq: Two times a day (BID) | ORAL | 11 refills | 0 days
Start: 2017-01-04 — End: 2017-01-10

## 2017-01-04 NOTE — Unmapped (Signed)
Per Dr. Celine Mans patient fine for next follow up appointment to be at the 6 month mark since mic-kay button is not easily done in outpatient.  Stated EMG needs to be read by neurologist on what to do so sent referral per provider.  Sent message to PharmD to see when she wants to see patient next.

## 2017-01-04 NOTE — Unmapped (Signed)
[facility]   Follow-Up Note    Patient Name:Jeffrey Ward  MRN: [id number]  DOB: 12/30/1950  Age: 66 y.o.   Date: 01/10/2017  Attending Physician: Idelle Crouch, MD    ASSESSMENT:     Pt is a 66 y.o.  male with debility    PLAN:   The following instructions were given to the patient:    Low back pain with pinched nerve (radiculopathy) and peripheral neuropathy:  -Referral to outpatient physical therapy in [place] to work on pain reduction, core strengthening, more strengthening, balance, stretching and gait training.  -Talk with transplant team about consideration of neuropathic pain reducing medicine, like Gabapentin or Lyrica to consider. Continue the Aspercreme with lidocaine, heating pad, tramadol and tylenol as needed.    Soft voice/Dysphonia:  -Referral to the [facility] ENT Voice clinic for evaluation and treatment.    Return to PM&R clinic in about 3 months.  - Return in about 3 months (around 04/12/2017).      SUBJECTIVE:     Chief complaint: Routine Follow-up      History of present Illness:  Jeffrey Ward??is a 66 y.o.??male??with a history of IDDM, GERD, prior perioperative a-fib with RVR and 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 Post-op course complicated by decompensation thought to be secondary to acute right heart failure and AKI requiring CRRT (resolved). 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 Severe PAH diagnosed. Eventually had episodes of a. Fib/tachyarrythmia x3, converted with amiodarone, then with digoxin; Cardiology following closely. On 11/03/16 he had a significant spike in his LFTs suspicious for acute rejection; however liver biopsy from 11/04/16 did not show any evidence of rejection. He underwent G tube on 11/17/2016. ??Hep gtt restarted for bridging to coumadin. Developed GIB on 9/9 and underwent intubation for EGD. Small ulcer at GEJ was injected with epinephrine and clipped. The patient participated in acute inpatient rehab from 11/24/16 - 12/10/16. He was discharged with Home health PT/OT/SLP. Today, he's having low back pain, left leg pain and and B/L feet numbness.  He has a rollator and no AFO left leg weakness.  He did have an EMG on 12/26/16.    -Falls: fell 1 time a couple of weeks ago. Fell up against wall next to commode.    -Any recent injuries: none.     -Spasticity and management: none.    -Pain/Other Musculoskeletal issues:  Left leg pain electric shock from foot to knee. Tried  Lyrica some years ago for his chronic low back pain. Tries the heating pad. Tried the aspercreme with lidocaine helps a little. Also, the tramadol helps a little.    -Headaches: daily.     -CVS complications and updates: right chest wall pain around liver tranpslint. Sometimes SOB.    -Bowel issues: none.     -Bladder or Urinary issues : none.    -Cognitive and Communication issues: yes.   -Memory: short term memoray  -Concentration: a little.     -Swallowing issues/Dysphagia: none  -Dysphonia.  -Diet: He's eating regular diet, with sometimes does TFs. Ensure strawberry and chocolate 1 x/day.    -Visual deficits: wears glasses. Blurry vision. Will set up appt with eye doctor.    -Motor function:  -weakness: left leg.   -balance:  Better, but not at baseline.     -Sensory or perceptual deficits: fingers and feet and left hand have numbness and tingling.     -Dizziness: rarely.    -  Primary care Healthcare screenings and updates: Sees transplant. He'll follow up with PCP is December with Dr. Graciela Husbands.    History    The following portions of the patient's history were reviewed in the electronic medical record and updated as appropriate:   Allergies  Current medications  Past medical history  Past surgical history  Past social history  Past family history  Problem list    Medical / Surgical History:  Reviewed  Past Medical History:   Diagnosis Date   ??? Cancer (CMS-HCC)    ??? Cirrhosis (CMS-HCC)    ??? Diabetes (CMS-HCC)    ??? Hepatitis C 07/17/2012   ??? Liver disease    ??? Low back pain 07/17/2012   ??? Varices, esophageal (CMS-HCC)        Past Surgical History:   Procedure Laterality Date   ??? ANKLE SURGERY     ??? BACK SURGERY     ??? BACK SURGERY     ??? CHG US GUIDE, TISSUE ABLATION N/A 01/22/2016    Procedure: ULTRASOUND GUIDANCE FOR, AND MONITORING OF, PARENCHYMAL TISSUE ABLATION;  Surgeon: Particia Nearing, MD;  Location: MAIN OR [facility];  Service: Transplant   ??? IR EMBOLIZATION ORGAN ISCHEMIA, TUMORS, INFAR  06/16/2016    IR EMBOLIZATION ORGAN ISCHEMIA, TUMORS, INFAR 06/16/2016 Ammie Dalton, MD IMG VIR [facility]   ??? PR COLON CA SCRN NOT HI RSK IND N/A 02/27/2015    Procedure: COLOREC CNCR SCR;COLNSCPY NO;  Surgeon: Vonda Antigua, MD;  Location: GI PROCEDURES MEMORIAL [facility];  Service: Gastroenterology   ??? PR ENDOSCOPIC ULTRASOUND EXAM N/A 02/27/2015    Procedure: UGI ENDO; W/ENDO ULTRASOUND EXAM INCLUDES ESOPHAGUS, STOMACH, &/OR DUODENUM/JEJUNUM;  Surgeon: Vonda Antigua, MD;  Location: GI PROCEDURES MEM[facility];  Service: Gastroenterology   ??? PR ERCP STENT PLACEMENT BILIARY/PANCREATIC DUCT N/A 11/29/2016    Procedure: ENDOSCOPIC RETROGRADE CHOLANGIOPANCREATOGRAPHY (ERCP); WITH PLACEMENT OF ENDOSCOPIC STENT INTO BILIARY OR PANCREATIC DUCT;  Surgeon: Chriss Driver, MD;  Location: GI PROCEDURES MEMORIAL [facility];  Service: Gastroenterology   ??? PR ERCP,W/REMOVAL STONE,BIL/PANCR DUCTS N/A 11/29/2016    Procedure: ERCP; W/ENDOSCOPIC RETROGRADE REMOVAL OF CALCULUS/CALCULI FROM BILIARY &/OR PANCREATIC DUCTS;  Surgeon: Chriss Driver, MD;  Location: GI PROCEDURES [facility];  Service: Gastroenterology   ??? PR INSER HEART TEMP PACER ONE CHMBR N/A 10/02/2016    Procedure: Tempoarary Pacemaker Insertion;  Surgeon: Meredith Leeds, MD;  Location: [facility];  Service: Cardiology   ??? PR LAP,DIAGNOSTIC ABDOMEN N/A 01/22/2016    Procedure: Laparoscopy, Abdomen, Peritoneum, & Omentum, Diagnostic, W/Wo Collection Specimen(S) By Brushing Or Washing;  Surgeon: Particia Nearing, MD;  Location: MAIN OR [facility];  Service: Transplant   ??? PR PLACE PERCUT GASTROSTOMY TUBE N/A 11/17/2016    Procedure: UGI ENDO; W/DIRECTED PLCMT PERQ GASTROSTOMY TUBE;  Surgeon: Cletis Athens, MD;  Location: GI PROCEDURES [facility];  Service: Gastroenterology   ??? PR TRACHEOSTOMY, PLANNED N/A 09/29/2016    Procedure: TRACHEOSTOMY PLANNED (SEPART PROC);  Surgeon: Katherina Mires, MD;  Location: MAIN OR [facility];  Service: Trauma   ??? PR TRANSCATH INSERT OR REPLACE LEADLESS PM VENTR N/A 10/11/2016    Procedure: Pacemaker Implant/Replace Leadless;  Surgeon: Meredith Leeds, MD;  Location: [facility];  Service: Cardiology   ??? PR TRANSPLANT LIVER,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    Procedure: Liver Allotransplantation; Orthotopic, Partial Or Whole, From Cadaver Or Living Donor, Any Age;  Surgeon: Doyce Loose, MD;  Location: MAIN OR [facility];  Service: Transplant   ??? PR TRANSPLANT,PREP  DONOR LIVER, WHOLE N/A 09/15/2016    Procedure: Rogelia Boga Std Prep Cad Donor Whole Liver Gft Prior Tnsplnt,Inc Chole,Diss/Rem Surr Tissu Wo Triseg/Lobe Splt;  Surgeon: Doyce Loose, MD;  Location: MAIN OR [facility];  Service: Transplant   ??? PR UPPER GI ENDOSCOPY,BIOPSY N/A 07/17/2012    Procedure: UGI ENDOSCOPY; WITH BIOPSY, SINGLE OR MULTIPLE;  Surgeon: Alba Destine, MD;  Location: GI PROCEDURES MEMOR[facility];  Service: Gastroenterology   ??? PR UPPER GI ENDOSCOPY,DIAGNOSIS N/A 02/04/2014    Procedure: UGI ENDO, INCLUDE ESOPHAGUS, STOMACH, & DUODENUM &/OR JEJUNUM; DX W/WO COLLECTION SPECIMN, BY BRUSH OR WASH;  Surgeon: Wilburt Finlay, MD;  Location: GI PROCEDURES [facility]ORIAL [facility];  Service: Gastroenterology   ??? PR UPPER GI ENDOSCOPY,LIGAT VARIX N/A 11/05/2013    Procedure: UGI ENDO; W/BAND LIG ESOPH &/OR GASTRIC VARICES;  Surgeon: Wilburt Finlay, MD;  Location: GI PROCEDURES [facility];  Service: Gastroenterology       Social History: Reviewed   reports that he has never smoked. He has never used smokeless tobacco. He reports that he does not drink alcohol or use drugs.    -Marital status: Married.    Functional Status: independent to mod I:  *Dressing : i  *Bathing: I  *Toileting: I  *Ambulation: mod I  *Transfers (moving from bed to chair, chair to toilet, etc.): mod I.    -Braces or orthotic devices: none.  -Wheelchair, cane or walker usage: rollator.    -Current Therapies: H/H PT now and RN.      Family History:  Reviewed  family history includes Hypertension in his mother.    Allergies: Reviewed  Patient has no known allergies.    Medications: Reviewed  Current Outpatient Prescriptions   Medication Sig Dispense Refill   ??? acetaminophen (TYLENOL) 160 mg/5 mL solution Take 10.2 mL (325 mg total) by mouth every four (4) hours as needed for pain. 300 mL 0   ??? amiodarone (PACERONE) 200 MG tablet 1 tablet (200 mg total) by G-tube route daily. 30 tablet 11   ??? blood sugar diagnostic (FREESTYLE TEST) Strp by Other route Four (4) times a day (before meals and nightly). 100 each 11   ??? blood-glucose meter (GLUCOSE MONITORING KIT) kit Use as instructed 1 each 0   ??? cholecalciferol, vitamin D3, 2,000 unit cap 1 capsule (2,000 Units total) by PEG Tube route daily. 30 capsule 11   ??? digoxin (LANOXIN) 125 mcg tablet 0.5 tablets (62.5 mcg total) by G-tube route daily. 15 tablet 11   ??? insulin ASPART (NOVOLOG FLEXPEN U-100 INSULIN) 100 unit/mL injection pen Inject 0.04 mL (4 Units total) under the skin Three (3) times a day before meals. 4 mL 11   ??? insulin ASPART (NOVOLOG FLEXPEN U-100 INSULIN) 100 unit/mL injection pen Inject 0-0.12 mL (0-12 Units total) under the skin Four (4) times a day (before meals and nightly). Sliding scale. 14.4 mL 0   ??? insulin glargine (LANTUS) 100 unit/mL (3 mL) injection pen Inject 0.12 mL (12 Units total) under the skin nightly. 4 mL 11   ??? lancets Misc 1 each by Miscellaneous route Four (4) times a day (before meals and nightly). 100 each 11   ??? magnesium oxide (MAG-OX) 400 mg (241.3 mg magnesium) tablet 1 tablet (400 mg total) by G-tube route Three (3) times a day. 90 tablet 11   ??? melatonin 3 mg Tab 1-2 tablets (3-6 mg total) by PEG Tube route nightly. 60 tablet 0   ??? mycophenolate (CELLCEPT) 200 mg/mL suspension  1.3 mL (260 mg total) by G-tube route Two (2) times a day. 175 mL 12   ??? NEORAL 100 mg capsule Take 2 capsules (200 mg total) by mouth Two (2) times a day. 60 capsule 11   ??? omeprazole (PRILOSEC) 20 MG capsule Take 1 capsule (20 mg total) by mouth daily. 30 capsule 1   ??? pen needle, diabetic (ULTICARE PEN NEEDLE) 32 gauge x 5/32 Ndle 1 each by Miscellaneous route Four (4) times a day (before meals and nightly). 90 each 3   ??? SILACE 50 mg/5 mL liquid TAKE 10 MLS BY G-TUBE ROUTE TWICE DAILY AS NEEDED 473 mL PRN   ??? sildenafil, antihypertensive, (REVATIO) 20 mg tablet 2 tablets (40 mg total) by G-tube route Three (3) times a day. Dissolve 1 1/2 tablets in water and administer via PEG tube. 180 tablet 6   ??? sulfamethoxazole-trimethoprim (BACTRIM,SEPTRA) 200-40 mg/5 mL suspension 10 mL by G-tube route 3 (three) times a week. 360 mL 0   ??? thiamine (B-1) 100 MG tablet 1 tablet (100 mg total) by G-tube route daily. 30 tablet 11   ??? warfarin (COUMADIN) 3 MG tablet 1 tablet (3 mg total) by G-tube route daily with evening meal. 30 tablet 11   ??? mycophenolate (CELLCEPT) 250 mg capsule Take 1 capsule (250 mg total) by mouth Two (2) times a day. (Patient not taking: Reported on 01/10/2017) 60 capsule 11     Current Facility-Administered Medications   Medication Dose Route Frequency Provider Last Rate Last Dose   ??? filgrastim (NEUPOGEN) injection 300 mcg  300 mcg Subcutaneous Once Ruth-Ann Maryln Manuel, CPP                            Review of Systems:   10 organ systems reviewed and pertinent as noted in HPI.        OBJECTIVE:     BP 141/68  - Pulse 80  - Ht 149.9 cm (4' 11)  - Wt 64.4 kg (141 lb 15.6 oz)  - BMI 28.68 kg/m??       Physical Exam:   Gen: NAD  HENT: Oral pharynx is moist. No signs of erythema. No nystagmus  CVS: RRR. Normal pulses. Good perfusion of limbs.  Lungs: CTAB. Nonlabored respirations.  Abd: Normal active BS. Nondistended.  Ext: No cyanosis.  MSK: No deformities or tenderness to palpation of joints. Good ROM.  Neuro:  CN 2-12 grossly intact  Strength is 5/5 throughout B/L UE and LE  Sensation: slightly decreased to light touch of lower legs.  Tone: WNL  Gait within normal limits using rollator.   Diagnostic Studies:     EMG 12/26/16: Abnormal study. 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   ??      Idelle Crouch, MD  01/10/2017 12:39 PM

## 2017-01-05 DIAGNOSIS — I272 Pulmonary hypertension, unspecified: Secondary | ICD-10-CM

## 2017-01-05 DIAGNOSIS — Z950 Presence of cardiac pacemaker: Secondary | ICD-10-CM

## 2017-01-05 DIAGNOSIS — M961 Postlaminectomy syndrome, not elsewhere classified: Secondary | ICD-10-CM

## 2017-01-05 DIAGNOSIS — Z794 Long term (current) use of insulin: Secondary | ICD-10-CM

## 2017-01-05 DIAGNOSIS — K219 Gastro-esophageal reflux disease without esophagitis: Secondary | ICD-10-CM

## 2017-01-05 DIAGNOSIS — G894 Chronic pain syndrome: Secondary | ICD-10-CM

## 2017-01-05 DIAGNOSIS — I4891 Unspecified atrial fibrillation: Secondary | ICD-10-CM

## 2017-01-05 DIAGNOSIS — D649 Anemia, unspecified: Secondary | ICD-10-CM

## 2017-01-05 DIAGNOSIS — Z6821 Body mass index (BMI) 21.0-21.9, adult: Secondary | ICD-10-CM

## 2017-01-05 DIAGNOSIS — Z431 Encounter for attention to gastrostomy: Secondary | ICD-10-CM

## 2017-01-05 DIAGNOSIS — Z9181 History of falling: Secondary | ICD-10-CM

## 2017-01-05 DIAGNOSIS — Z8505 Personal history of malignant neoplasm of liver: Secondary | ICD-10-CM

## 2017-01-05 DIAGNOSIS — E46 Unspecified protein-calorie malnutrition: Secondary | ICD-10-CM

## 2017-01-05 DIAGNOSIS — Z7982 Long term (current) use of aspirin: Secondary | ICD-10-CM

## 2017-01-05 DIAGNOSIS — Z4823 Encounter for aftercare following liver transplant: Principal | ICD-10-CM

## 2017-01-05 DIAGNOSIS — E1142 Type 2 diabetes mellitus with diabetic polyneuropathy: Secondary | ICD-10-CM

## 2017-01-05 DIAGNOSIS — N400 Enlarged prostate without lower urinary tract symptoms: Secondary | ICD-10-CM

## 2017-01-05 DIAGNOSIS — Z8619 Personal history of other infectious and parasitic diseases: Secondary | ICD-10-CM

## 2017-01-06 ENCOUNTER — Ambulatory Visit
Admission: RE | Admit: 2017-01-06 | Discharge: 2017-01-06 | Disposition: A | Discharge status: Home / Self Care | Payer: MEDICARE | Attending: Cardiovascular Disease | Admitting: Cardiovascular Disease

## 2017-01-06 ENCOUNTER — Ambulatory Visit
Admission: RE | Admit: 2017-01-06 | Discharge: 2017-01-06 | Disposition: A | Discharge status: Home / Self Care | Payer: MEDICARE

## 2017-01-06 ENCOUNTER — Ambulatory Visit
Admission: RE | Admit: 2017-01-06 | Discharge: 2017-01-06 | Disposition: A | Discharge status: Home / Self Care | Payer: MEDICARE | Admitting: Internal Medicine

## 2017-01-06 DIAGNOSIS — N400 Enlarged prostate without lower urinary tract symptoms: Secondary | ICD-10-CM

## 2017-01-06 DIAGNOSIS — Z950 Presence of cardiac pacemaker: Secondary | ICD-10-CM

## 2017-01-06 DIAGNOSIS — Z4823 Encounter for aftercare following liver transplant: Principal | ICD-10-CM

## 2017-01-06 DIAGNOSIS — Z431 Encounter for attention to gastrostomy: Secondary | ICD-10-CM

## 2017-01-06 DIAGNOSIS — Z944 Liver transplant status: Secondary | ICD-10-CM

## 2017-01-06 DIAGNOSIS — K766 Portal hypertension: Secondary | ICD-10-CM

## 2017-01-06 DIAGNOSIS — Z794 Long term (current) use of insulin: Secondary | ICD-10-CM

## 2017-01-06 DIAGNOSIS — K219 Gastro-esophageal reflux disease without esophagitis: Secondary | ICD-10-CM

## 2017-01-06 DIAGNOSIS — E46 Unspecified protein-calorie malnutrition: Secondary | ICD-10-CM

## 2017-01-06 DIAGNOSIS — I4891 Unspecified atrial fibrillation: Secondary | ICD-10-CM

## 2017-01-06 DIAGNOSIS — Z79899 Other long term (current) drug therapy: Secondary | ICD-10-CM

## 2017-01-06 DIAGNOSIS — Z8619 Personal history of other infectious and parasitic diseases: Secondary | ICD-10-CM

## 2017-01-06 DIAGNOSIS — D649 Anemia, unspecified: Secondary | ICD-10-CM

## 2017-01-06 DIAGNOSIS — R0600 Dyspnea, unspecified: Principal | ICD-10-CM

## 2017-01-06 DIAGNOSIS — Z8505 Personal history of malignant neoplasm of liver: Secondary | ICD-10-CM

## 2017-01-06 DIAGNOSIS — D508 Other iron deficiency anemias: Secondary | ICD-10-CM

## 2017-01-06 DIAGNOSIS — E1142 Type 2 diabetes mellitus with diabetic polyneuropathy: Secondary | ICD-10-CM

## 2017-01-06 DIAGNOSIS — I2721 Secondary pulmonary arterial hypertension: Secondary | ICD-10-CM

## 2017-01-06 DIAGNOSIS — Z7982 Long term (current) use of aspirin: Secondary | ICD-10-CM

## 2017-01-06 DIAGNOSIS — G894 Chronic pain syndrome: Secondary | ICD-10-CM

## 2017-01-06 DIAGNOSIS — Z9181 History of falling: Secondary | ICD-10-CM

## 2017-01-06 DIAGNOSIS — M961 Postlaminectomy syndrome, not elsewhere classified: Secondary | ICD-10-CM

## 2017-01-06 DIAGNOSIS — Z6821 Body mass index (BMI) 21.0-21.9, adult: Secondary | ICD-10-CM

## 2017-01-06 DIAGNOSIS — I272 Pulmonary hypertension, unspecified: Secondary | ICD-10-CM

## 2017-01-06 LAB — COMPREHENSIVE METABOLIC PANEL
ALBUMIN: 3.3 g/dL — ABNORMAL LOW (ref 3.5–5.0)
ALKALINE PHOSPHATASE: 202 U/L — ABNORMAL HIGH (ref 38–126)
ALT (SGPT): 38 U/L (ref 19–72)
ANION GAP: 7 mmol/L — ABNORMAL LOW (ref 9–15)
AST (SGOT): 22 U/L (ref 19–55)
BILIRUBIN TOTAL: 0.9 mg/dL (ref 0.0–1.2)
BLOOD UREA NITROGEN: 23 mg/dL — ABNORMAL HIGH (ref 7–21)
CALCIUM: 9.3 mg/dL (ref 8.5–10.2)
CHLORIDE: 104 mmol/L (ref 98–107)
CO2: 24 mmol/L (ref 22.0–30.0)
CREATININE: 1.18 mg/dL (ref 0.70–1.30)
EGFR MDRD AF AMER: >=60 mL/min/{1.73_m2} (ref >=60)
EGFR MDRD NON AF AMER: >=60 mL/min/{1.73_m2} (ref >=60)
GLUCOSE RANDOM: 118 mg/dL (ref 65–179)
POTASSIUM: 3.9 mmol/L (ref 3.5–5.0)
PROTEIN TOTAL: 5.7 g/dL — ABNORMAL LOW (ref 6.5–8.3)
SODIUM: 135 mmol/L (ref 135–145)

## 2017-01-06 LAB — CBC W/ AUTO DIFF
BASOPHILS ABSOLUTE COUNT: 0 10*9/L (ref 0.0–0.1)
HEMATOCRIT: 30 % — ABNORMAL LOW (ref 41.0–53.0)
HEMOGLOBIN: 10.3 g/dL — ABNORMAL LOW (ref 13.5–17.5)
LARGE UNSTAINED CELLS: 2 % (ref 0–4)
LYMPHOCYTES ABSOLUTE COUNT: 0.4 10*9/L — ABNORMAL LOW (ref 1.5–5.0)
MEAN CORPUSCULAR HEMOGLOBIN CONC: 34.3 g/dL (ref 31.0–37.0)
MEAN CORPUSCULAR HEMOGLOBIN: 32.6 pg (ref 26.0–34.0)
MEAN CORPUSCULAR VOLUME: 95.1 fL (ref 80.0–100.0)
MEAN PLATELET VOLUME: 8.9 fL (ref 7.0–10.0)
MONOCYTES ABSOLUTE COUNT: 0.1 10*9/L — ABNORMAL LOW (ref 0.2–0.8)
NEUTROPHILS ABSOLUTE COUNT: 0.6 10*9/L — ABNORMAL LOW (ref 2.0–7.5)
PLATELET COUNT: 108 10*9/L — ABNORMAL LOW (ref 150–440)
RED CELL DISTRIBUTION WIDTH: 18.2 % — ABNORMAL HIGH (ref 12.0–15.0)
WBC ADJUSTED: 1.2 10*9/L — ABNORMAL LOW (ref 4.5–11.0)

## 2017-01-06 LAB — PROTIME-INR: PROTIME: 30.3 s — ABNORMAL HIGH (ref 10.2–12.8)

## 2017-01-06 LAB — BILIRUBIN DIRECT: Bilirubin.glucuronidated:MCnc:Pt:Ser/Plas:Qn:: 0.4

## 2017-01-06 LAB — INR: Lab: 2.66

## 2017-01-06 LAB — WBC ADJUSTED: Lab: 1.2 — ABNORMAL LOW

## 2017-01-06 LAB — PHOSPHORUS: Phosphate:MCnc:Pt:Ser/Plas:Qn:: 3.1

## 2017-01-06 LAB — MAGNESIUM: Magnesium:MCnc:Pt:Ser/Plas:Qn:: 1.6

## 2017-01-06 LAB — ANION GAP: Anion gap 3:SCnc:Pt:Ser/Plas:Qn:: 7 — ABNORMAL LOW

## 2017-01-06 MED ORDER — SILDENAFIL (PULMONARY HYPERTENSION) 20 MG TABLET: 40 mg | tablet | Freq: Three times a day (TID) | 6 refills | 0 days | Status: AC

## 2017-01-06 MED ORDER — SILDENAFIL (PULMONARY HYPERTENSION) 20 MG TABLET
ORAL_TABLET | Freq: Three times a day (TID) | GASTROSTOMY | 6 refills | 0.00000 days | Status: CP
Start: 2017-01-06 — End: 2017-01-06

## 2017-01-06 MED ORDER — SILDENAFIL (PULMONARY HYPERTENSION) 20 MG TABLET: tablet | 6 refills | 0 days

## 2017-01-06 NOTE — Unmapped (Signed)
[facility] at [facility]   Pulmonary Hypertension Program                                               Lattie Haw, MD???Director (Pulmonary)     Denice Bors. Tresa Res, MD--Associate Director (Pulmonary)    Freeman Caldron, MD (Cardiology)     Esmond Plants, DO (Pulmonary)    Marva Panda, RN Nurse Coordinator     Claretha Cooper, RN Nurse Coordinator    [phone] office - [phone] fax          [name]  is a 66 y.o. male  patient referred by [name], MD for work up and evaluation for pulmonary hypertension.    PAH Treatment History:  Sildenafil 30 mg tid since post liver txplt hospitalization.    Pt describes a history of recent liver txplt in 09/2016.  Post op was noted to have previously unrecognized PAH and RV failure, treated initially with inhaled iloprost while on vent support and then transitioned to sildenafil.  He had a long post op course requiring a stay in rehab of about 2 weeks prior to discharge.   His post op course was further complicated by AKI (on CRRT then had renal recovery), afib on anticoagulation currently, bradycardia requiring pacemaker placement.  CTA negative for PE post op.      He notes progressive improvement in his exertional capacity.  No syncope, but does have some episodes of lightheadedness vs vertigo when getting out of bed.      No LE edema currently.      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  CTEPH has not been completely ruled out.      The patient's current NYHA functional class is Class 2 - comfortable at rest but have symptoms with ordinary physcial activity.       He is doing well on sildenafil currently.  Will plan to increase to 40 mg tid.    He should have a V/Q scan at some point to more definitively rule out CTEPH.    Educated patient and spouse on importance of not stopping sildenafil, as PH may worsen, leading to hepatic congestion and potential rejection of liver graft.      Plan:          1) increase sildenafil to 40 mg tid through [facility] pharmacy.      2) VQ scan in the next 2 months to eval for CTEPH.      3) echo at next visit in 3 months.      Subjective:      Past medical history is significant for:   Past Medical History:   Diagnosis Date   ??? Cancer (CMS-HCC)    ??? Cirrhosis (CMS-HCC)    ??? Diabetes (CMS-HCC)    ??? Hepatitis C 07/17/2012   ??? Liver disease    ??? Low back pain 07/17/2012   ??? Varices, esophageal (CMS-HCC)       Family History   Problem Relation Age of Onset   ??? Hypertension Mother    ??? Cirrhosis Neg Hx    ??? Liver cancer Neg Hx       Social History     Social History   ???  Marital status: Married     Spouse name: N/A   ??? Number of children: N/A   ??? Years of education: N/A     Occupational History   ??? Not on file.     Social History Main Topics   ??? Smoking status: Never Smoker   ??? Smokeless tobacco: Never Used      Comment: Smoked in high school for about 5 years.    ??? Alcohol use No   ??? Drug use: No   ??? Sexual activity: Yes     Partners: Female     Birth control/ protection: None     Other Topics Concern   ??? Not on file     Social History Narrative   ??? No narrative on file      Past Surgical History:   Procedure Laterality Date   ??? ANKLE SURGERY     ??? BACK SURGERY     ??? BACK SURGERY     ??? CHG US GUIDE, TISSUE ABLATION N/A 01/22/2016    Procedure: ULTRASOUND GUIDANCE FOR, AND MONITORING OF, PARENCHYMAL TISSUE ABLATION;  Surgeon: Particia Nearing, MD;  Location: MAIN OR [facility];  Service: Transplant   ??? IR EMBOLIZATION ORGAN ISCHEMIA, TUMORS, INFAR  06/16/2016    IR EMBOLIZATION ORGAN ISCHEMIA, TUMORS, INFAR 06/16/2016 Ammie Dalton, MD IMG VIR H[facility]V [facility]   ??? PR COLON CA SCRN NOT HI RSK IND N/A 02/27/2015    Procedure: COLOREC CNCR SCR;COLNSCPY NO;  Surgeon: Vonda Antigua, MD;  Location: GI PROCEDURES MEMORIAL [facility];  Service: Gastroenterology   ??? PR ENDOSCOPIC ULTRASOUND EXAM N/A 02/27/2015    Procedure: UGI ENDO; W/ENDO ULTRASOUND EXAM INCLUDES ESOPHAGUS, STOMACH, &/OR DUODENUM/JEJUNUM;  Surgeon: Vonda Antigua, MD;  Location: GI PROCEDURES MEMORIAL [facility];  Service: Gastroenterology   ??? PR ERCP STENT PLACEMENT BILIARY/PANCREATIC DUCT N/A 11/29/2016    Procedure: ENDOSCOPIC RETROGRADE CHOLANGIOPANCREATOGRAPHY (ERCP); WITH PLACEMENT OF ENDOSCOPIC STENT INTO BILIARY OR PANCREATIC DUCT;  Surgeon: Chriss Driver, MD;  Location: GI PROCEDURES MEMORIAL [facility];  Service: Gastroenterology   ??? PR ERCP,W/REMOVAL STONE,BIL/PANCR DUCTS N/A 11/29/2016    Procedure: ERCP; W/ENDOSCOPIC RETROGRADE REMOVAL OF CALCULUS/CALCULI FROM BILIARY &/OR PANCREATIC DUCTS;  Surgeon: Chriss Driver, MD;  Location: GI PROCEDURES [facility];  Service: Gastroenterology   ??? PR INSER HEART TEMP PACER ONE CHMBR N/A 10/02/2016    Procedure: Tempoarary Pacemaker Insertion;  Surgeon: Meredith Leeds, MD;  Location: [facility];  Service: Cardiology   ??? PR LAP,DIAGNOSTIC ABDOMEN N/A 01/22/2016    Procedure: Laparoscopy, Abdomen, Peritoneum, & Omentum, Diagnostic, W/Wo Collection Specimen(S) By Brushing Or Washing;  Surgeon: Particia Nearing, MD;  Location: MAIN OR [facility];  Service: Transplant   ??? PR PLACE PERCUT GASTROSTOMY TUBE N/A 11/17/2016    Procedure: UGI ENDO; W/DIRECTED PLCMT PERQ GASTROSTOMY TUBE;  Surgeon: Cletis Athens, MD;  Location: GI PROCEDURES [facility]OR[facility];  Service: Gastroenterology   ??? PR TRACHEOSTOMY, PLANNED N/A 09/29/2016    Procedure: TRACHEOSTOMY PLANNED (SEPART PROC);  Surgeon: Katherina Mires, MD;  Location: MAIN OR [facility];  Service: Trauma   ??? PR TRANSCATH INSERT OR REPLACE LEADLESS PM VENTR N/A 10/11/2016    Procedure: Pacemaker Implant/Replace Leadless;  Surgeon: Meredith Leeds, MD;  Location: [facility];  Service: Cardiology   ??? PR TRANSPLANT LIVER,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    Procedure: Liver Allotransplantation; Orthotopic, Partial Or Whole, From Cadaver Or Living Donor, Any Age;  Surgeon: Doyce Loose, MD;  Location: MAIN OR [facility];  Service: Transplant   ???  PR TRANSPLANT,PREP DONOR LIVER, WHOLE N/A 09/15/2016    Procedure: Rogelia Boga Std Prep Cad Donor Whole Liver Gft Prior Tnsplnt,Inc Chole,Diss/Rem Surr Tissu Wo Triseg/Lobe Splt;  Surgeon: Doyce Loose, MD;  Location: MAIN OR [facility];  Service: Transplant   ??? PR UPPER GI ENDOSCOPY,BIOPSY N/A 07/17/2012    Procedure: UGI ENDOSCOPY; WITH BIOPSY, SINGLE OR MULTIPLE;  Surgeon: Alba Destine, MD;  Location: GI PROCEDURES [facility]IAL [facility];  Service: Gastroenterology   ??? PR UPPER GI ENDOSCOPY,DIAGNOSIS N/A 02/04/2014    Procedure: UGI ENDO, INCLUDE ESOPHAGUS, STOMACH, & DUODENUM &/OR JEJUNUM; DX W/WO COLLECTION SPECIMN, BY BRUSH OR WASH;  Surgeon: Wilburt Finlay, MD;  Location: GI PROCEDURES [facility]ORIAL [facility];  Service: Gastroenterology   ??? PR UPPER GI ENDOSCOPY,LIGAT VARIX N/A 11/05/2013    Procedure: UGI ENDO; W/BAND LIG ESOPH &/OR GASTRIC VARICES;  Surgeon: Wilburt Finlay, MD;  Location: GI PROCEDURES [facility];  Service: Gastroenterology        Objective:   Objective   Review of systems is significant for:     As stated in the HPI, remainder of 12 system review is negative.      No Known Allergies   Current Outpatient Prescriptions   Medication Sig Dispense Refill   ??? acetaminophen (TYLENOL) 160 mg/5 mL solution Take 10.2 mL (325 mg total) by mouth every four (4) hours as needed for pain. 300 mL 0   ??? amiodarone (PACERONE) 200 MG tablet 1 tablet (200 mg total) by G-tube route daily. 30 tablet 11   ??? blood sugar diagnostic (FREESTYLE TEST) Strp by Other route Four (4) times a day (before meals and nightly). 100 each 11   ??? blood-glucose meter (GLUCOSE MONITORING KIT) kit Use as instructed 1 each 0   ??? cholecalciferol, vitamin D3, 2,000 unit cap 1 capsule (2,000 Units total) by PEG Tube route daily. 30 capsule 11   ??? digoxin (LANOXIN) 125 mcg tablet 0.5 tablets (62.5 mcg total) by G-tube route daily. 15 tablet 11   ??? insulin ASPART (NOVOLOG FLEXPEN U-100 INSULIN) 100 unit/mL injection pen Inject 0.04 mL (4 Units total) under the skin Three (3) times a day before meals. 4 mL 11   ??? insulin glargine (LANTUS) 100 unit/mL (3 mL) injection pen Inject 0.12 mL (12 Units total) under the skin nightly. 4 mL 11   ??? lancets Misc 1 each by Miscellaneous route Four (4) times a day (before meals and nightly). 100 each 11   ??? magnesium oxide (MAG-OX) 400 mg (241.3 mg magnesium) tablet 1 tablet (400 mg total) by G-tube route Three (3) times a day. 90 tablet 11   ??? melatonin 3 mg Tab 1-2 tablets (3-6 mg total) by PEG Tube route nightly. 60 tablet 0   ??? mycophenolate (CELLCEPT) 200 mg/mL suspension 1.3 mL (260 mg total) by G-tube route Two (2) times a day. 175 mL 12   ??? mycophenolate (CELLCEPT) 250 mg capsule Take 1 capsule (250 mg total) by mouth Two (2) times a day. 60 capsule 11   ??? NEORAL 100 mg capsule Take 2 capsules (200 mg total) by mouth Two (2) times a day. 60 capsule 11   ??? omeprazole (PRILOSEC) 20 MG capsule Take 1 capsule (20 mg total) by mouth daily. 30 capsule 1   ??? pen needle, diabetic (ULTICARE PEN NEEDLE) 32 gauge x 5/32 Ndle 1 each by Miscellaneous route Four (4) times a day (before meals and nightly). 90 each 3   ??? polyethylene glycol (MIRALAX) 17 gram packet 17  g by G-tube route daily as needed. (Patient taking differently: 17 g by G-tube route daily as needed. constipation) 24 each 0   ??? SILACE 50 mg/5 mL liquid TAKE 10 MLS BY G-TUBE ROUTE TWICE DAILY AS NEEDED 473 mL PRN   ??? sildenafil, antihypertensive, (REVATIO) 20 mg tablet 1.5 tablets (30 mg total) by G-tube route Three (3) times a day. Dissolve 1 1/2 tablets in water and administer via PEG tube. 135 tablet 0   ??? sulfamethoxazole-trimethoprim (BACTRIM,SEPTRA) 200-40 mg/5 mL suspension 10 mL by G-tube route 3 (three) times a week. 360 mL 0   ??? thiamine (B-1) 100 MG tablet 1 tablet (100 mg total) by G-tube route daily. 30 tablet 11   ??? warfarin (COUMADIN) 3 MG tablet 1 tablet (3 mg total) by G-tube route daily with evening meal. 30 tablet 11   ??? insulin ASPART (NOVOLOG FLEXPEN U-100 INSULIN) 100 unit/mL injection pen Inject 0-0.12 mL (0-12 Units total) under the skin Four (4) times a day (before meals and nightly). Sliding scale. 14.4 mL 0     No current facility-administered medications for this visit.         Physical Exam:    height is 150 cm (4' 11.06) and weight is 64.4 kg (142 lb). His blood pressure is 151/75 and his pulse is 75. His oxygen saturation is 100%.   General Appearance:    Alert, cooperative, no distress, appears stated age   Head:    Normocephalic, without obvious abnormality, atraumatic   Eyes:    PERRL, conjunctiva clear, EOM's intact    Oropharynx:   No lesions   Neck:   Supple, trachea midline, no adenopathy;    No JVD   Lungs:     Good breath sounds b/l, no crackles or wheezes, respirations unlabored   Chest Wall:    No tenderness or deformity    Heart:    Regular rate and rhythm, S1 and S2 normal, no murmur, rub   or gallop   Abdomen:     Soft, non-tender, normal bowel sounds, no masses, no organomegaly.  + G tube in place   Extremities:   Extremities normal, no cyanosis, clubbing, or edema   Pulses:   2+ and symmetric all extremities   Skin:   No rashes or lesions   Lymph nodes:   No cervical or supraclavicular adenopathy   Neurologic:   No focal neurological deficits       Diagnostic Review:      CT scan PE protocol performed on 09/2016 post op revealed:    Impression     No CT evidence for acute pulmonary embolism, as clinically questioned.    Moderate bilateral pleural effusions similar to prior CT dated 01/30/2016 with slightly increased atelectasis of the adjacent lung parenchyma in the bilateral lower lobes, left greater than right.    Addendum (Dr.Altun dictating 09/27/2016 08:35 AM) Focal contour bulging of the liver adjacent to the diaphragm and right kidney is noted which is not well evaluated due to the presence of streak artifacts.          Pulmonary Function Test performed on 11/24/15 show:            Consistent with normal spirometry and normal DLCO    6 Minute Walk Test performed on 01/06/17:         Echo performed on 09/27/16:    Echocardiographic Findings     Left Ventricle The left ventricle is normal in size with normal wall thickness.  The left ventricular systolic function is normal.   The ejection fraction is visually estimated at 60 to 65%.   Left ventricular diastolic function cannot be accurately assessed.      Left Atrium The left atrium is normal in size.      Right Ventricle The right ventricle is moderately dilated.   The right ventricular systolic function is decreased.      Tricuspid Valve The tricuspid valve is not well visualized.   There is mild tricuspid regurgitation by color and continuous wave Doppler imaging.   The regurgitation jet is directed toward the septum.   The pulmonary artery systolic pressure is severely elevated.   - Peak tricuspid regurgitant velocity: 4.6 m/sec  - Estimated pulmonary artery systolic pressure: 80 + RA pressure mmHg      Right Atrium The right atrium is poorly visualized but appears dilated      IVC/SVC Mechanical ventilation precludes the ability to accurately assess right atrial pressure.      Pericardium There is a small anterior pericardial effusion.          [facility] ICU Catheterization performed on date 09/22/16:     Wedge pressure initially looks to be 15-16 but with further balloon deflation this clearly decreases further to 8 mmHg with very good quality tracing. At that time mPAP 50, CVP 9, CO by CCO measurements 4.1/CI 2.2, PVR approximating 10 WU.

## 2017-01-06 NOTE — Unmapped (Addendum)
Thank you for visiting the [facility] Pulmonary Hypertension Clinic.    If you have any questions or concerns, please call     H. Sherre Lain, MD    Marva Panda, RN  Claretha Cooper, RN    Pulmonary Hypertension nurse coordinators:    [phone].

## 2017-01-06 NOTE — Unmapped (Signed)
Pt declined flu vaccine as advised due to medical condition.

## 2017-01-06 NOTE — Unmapped (Signed)
CIED Interrogation     INDICATION FOR IMPLANT: heart block     Device: Micra VR TCP  SN#: [id number]  Implant Date: 10/11/2016  Settings: VVIR  Lower Rate/Upper Rate: 50/120  Battery: >8 years   Presenting Rhythm: VS 70  Underlying Rhythm: SR 70    Percent Pacing  VP: 2.2 %  Sensing  RV: 14.9 mV  Impedances  RV: 590 ohms  Threshold   RV: 0.5 V @ 0.24 ms    Events: no events     Changes: no changes     PLAN:??continue remote monitoring.

## 2017-01-07 LAB — CYCLOSPORINE, TROUGH: Lab: 255

## 2017-01-09 DIAGNOSIS — Z9181 History of falling: Secondary | ICD-10-CM

## 2017-01-09 DIAGNOSIS — Z8619 Personal history of other infectious and parasitic diseases: Secondary | ICD-10-CM

## 2017-01-09 DIAGNOSIS — I272 Pulmonary hypertension, unspecified: Secondary | ICD-10-CM

## 2017-01-09 DIAGNOSIS — Z6821 Body mass index (BMI) 21.0-21.9, adult: Secondary | ICD-10-CM

## 2017-01-09 DIAGNOSIS — Z794 Long term (current) use of insulin: Secondary | ICD-10-CM

## 2017-01-09 DIAGNOSIS — N400 Enlarged prostate without lower urinary tract symptoms: Secondary | ICD-10-CM

## 2017-01-09 DIAGNOSIS — Z431 Encounter for attention to gastrostomy: Secondary | ICD-10-CM

## 2017-01-09 DIAGNOSIS — I4891 Unspecified atrial fibrillation: Secondary | ICD-10-CM

## 2017-01-09 DIAGNOSIS — Z950 Presence of cardiac pacemaker: Secondary | ICD-10-CM

## 2017-01-09 DIAGNOSIS — Z8505 Personal history of malignant neoplasm of liver: Secondary | ICD-10-CM

## 2017-01-09 DIAGNOSIS — D649 Anemia, unspecified: Secondary | ICD-10-CM

## 2017-01-09 DIAGNOSIS — Z4823 Encounter for aftercare following liver transplant: Principal | ICD-10-CM

## 2017-01-09 DIAGNOSIS — E46 Unspecified protein-calorie malnutrition: Secondary | ICD-10-CM

## 2017-01-09 DIAGNOSIS — E1142 Type 2 diabetes mellitus with diabetic polyneuropathy: Secondary | ICD-10-CM

## 2017-01-09 DIAGNOSIS — G894 Chronic pain syndrome: Secondary | ICD-10-CM

## 2017-01-09 DIAGNOSIS — M961 Postlaminectomy syndrome, not elsewhere classified: Secondary | ICD-10-CM

## 2017-01-09 DIAGNOSIS — K219 Gastro-esophageal reflux disease without esophagitis: Secondary | ICD-10-CM

## 2017-01-09 DIAGNOSIS — Z7982 Long term (current) use of aspirin: Secondary | ICD-10-CM

## 2017-01-09 LAB — COMPREHENSIVE METABOLIC PANEL
ALBUMIN: 3.5 g/dL (ref 3.5–5.0)
ALKALINE PHOSPHATASE: 480 U/L — ABNORMAL HIGH (ref 38–126)
ALT (SGPT): 89 U/L — ABNORMAL HIGH (ref 19–72)
ANION GAP: 6 mmol/L — ABNORMAL LOW (ref 9–15)
AST (SGOT): 54 U/L (ref 19–55)
BILIRUBIN TOTAL: 0.9 mg/dL (ref 0.0–1.2)
BLOOD UREA NITROGEN: 20 mg/dL (ref 7–21)
BUN / CREAT RATIO: 16
CALCIUM: 9.3 mg/dL (ref 8.5–10.2)
CO2: 26 mmol/L (ref 22.0–30.0)
CREATININE: 1.23 mg/dL (ref 0.70–1.30)
EGFR MDRD AF AMER: >=60 mL/min/{1.73_m2} (ref >=60)
EGFR MDRD NON AF AMER: 59 mL/min/{1.73_m2} — ABNORMAL LOW (ref >=60)
GLUCOSE RANDOM: 143 mg/dL (ref 65–179)
POTASSIUM: 4 mmol/L (ref 3.5–5.0)
PROTEIN TOTAL: 5.9 g/dL — ABNORMAL LOW (ref 6.5–8.3)
SODIUM: 138 mmol/L (ref 135–145)

## 2017-01-09 LAB — CBC W/ AUTO DIFF
BASOPHILS ABSOLUTE COUNT: 0 10*9/L (ref 0.0–0.1)
EOSINOPHILS ABSOLUTE COUNT: 0 10*9/L (ref 0.0–0.4)
HEMOGLOBIN: 10.7 g/dL — ABNORMAL LOW (ref 13.5–17.5)
LARGE UNSTAINED CELLS: 4 % (ref 0–4)
LYMPHOCYTES ABSOLUTE COUNT: 0.6 10*9/L — ABNORMAL LOW (ref 1.5–5.0)
MEAN CORPUSCULAR HEMOGLOBIN CONC: 34.8 g/dL (ref 31.0–37.0)
MEAN CORPUSCULAR HEMOGLOBIN: 33 pg (ref 26.0–34.0)
MEAN CORPUSCULAR VOLUME: 95 fL (ref 80.0–100.0)
MONOCYTES ABSOLUTE COUNT: 0.3 10*9/L (ref 0.2–0.8)
NEUTROPHILS ABSOLUTE COUNT: 0.8 10*9/L — ABNORMAL LOW (ref 2.0–7.5)
PLATELET COUNT: 136 10*9/L — ABNORMAL LOW (ref 150–440)
RED BLOOD CELL COUNT: 3.23 10*12/L — ABNORMAL LOW (ref 4.50–5.90)
RED CELL DISTRIBUTION WIDTH: 18.2 % — ABNORMAL HIGH (ref 12.0–15.0)
WBC ADJUSTED: 1.7 10*9/L — ABNORMAL LOW (ref 4.5–11.0)

## 2017-01-09 LAB — LYMPHOCYTES ABSOLUTE COUNT: Lab: 0.6 — ABNORMAL LOW

## 2017-01-09 LAB — PHOSPHORUS: Phosphate:MCnc:Pt:Ser/Plas:Qn:: 2.9

## 2017-01-09 LAB — MAGNESIUM: Magnesium:MCnc:Pt:Ser/Plas:Qn:: 1.5 — ABNORMAL LOW

## 2017-01-09 LAB — BILIRUBIN TOTAL: Bilirubin:MCnc:Pt:Ser/Plas:Qn:: 0.9

## 2017-01-09 LAB — PROTIME: Lab: 23.3 — ABNORMAL HIGH

## 2017-01-09 NOTE — Unmapped (Signed)
Spoke with home health who stated they will be unable to draw a CMV level and patient has to have it drawn at the hospital.  Attempted to call patient three times but no answer.  Left VM to call me back to review any symptoms.  Also per PharmD Nedra Hai patient to get Neupogen 300 mcg once will obtain CMV PCR at this time also. Left another VM letting patient know we need to schedule an appointment for tomorrow. Per PA Sh[place] continue to monitor LFTs.

## 2017-01-10 ENCOUNTER — Ambulatory Visit: Admission: RE | Admit: 2017-01-10 | Discharge: 2017-01-10 | Admitting: Internal Medicine

## 2017-01-10 ENCOUNTER — Ambulatory Visit
Admission: RE | Admit: 2017-01-10 | Discharge: 2017-01-10 | Payer: MEDICARE | Attending: Physical Medicine & Rehabilitation | Admitting: Physical Medicine & Rehabilitation

## 2017-01-10 DIAGNOSIS — B259 Cytomegaloviral disease, unspecified: Secondary | ICD-10-CM

## 2017-01-10 DIAGNOSIS — D702 Other drug-induced agranulocytosis: Secondary | ICD-10-CM

## 2017-01-10 DIAGNOSIS — G6289 Other specified polyneuropathies: Secondary | ICD-10-CM

## 2017-01-10 DIAGNOSIS — R490 Dysphonia: Secondary | ICD-10-CM

## 2017-01-10 DIAGNOSIS — Z944 Liver transplant status: Principal | ICD-10-CM

## 2017-01-10 DIAGNOSIS — Z79899 Other long term (current) drug therapy: Secondary | ICD-10-CM

## 2017-01-10 DIAGNOSIS — M5442 Lumbago with sciatica, left side: Principal | ICD-10-CM

## 2017-01-10 LAB — CYCLOSPORINE, TROUGH
CYCLOSPORINE, TROUGH: 181 ng/mL (ref <=800)
Lab: 181

## 2017-01-10 MED ORDER — NEORAL 100 MG CAPSULE
ORAL_CAPSULE | Freq: Two times a day (BID) | ORAL | 11 refills | 0.00000 days | Status: CP
Start: 2017-01-10 — End: 2017-02-10

## 2017-01-10 MED ORDER — NEORAL 100 MG CAPSULE: 200 mg | capsule | Freq: Two times a day (BID) | 11 refills | 0 days | Status: AC

## 2017-01-10 MED ORDER — SULFAMETHOXAZOLE 400 MG-TRIMETHOPRIM 80 MG TABLET
ORAL | 0 refills | 0 days
Start: 2017-01-10 — End: 2017-01-10

## 2017-01-10 MED FILL — NEORAL/100MG/CAP: NEORAL/100MG/CAP | 30 days supply | Qty: 120 | Fill #0

## 2017-01-10 MED FILL — SULFAMETHOXAZOLE/TRIMETHO/400-80MG/TABS: SULFAMETHOXAZOLE/TRIMETHO/400-80MG/TABS | 63 days supply | Qty: 27 | Fill #0

## 2017-01-10 NOTE — Unmapped (Signed)
Given 300 mcg of Granix sq right lower are. Tolerated injection well.

## 2017-01-10 NOTE — Unmapped (Signed)
Spoke with wife to obtain son's contact information for emergencies but she declined to provide it.  Let her know I am concerned she had chest pain earlier today and feel like she needs a break from patient care asked if she has someone to ask for help.  She stated her son's comes almost every weekend to help out.  Also let her know I am willing to fill out FMLA paperwork to help assist anyone in helping patient.  She continued to decline.  She stated Edsel Petrin cannot help due to having cancer.  Mentioned one other person she could call that could help occasionally.  Patient being with someone 24/7.  Let her know if she is concerned about patient she can set him up after being fed and given medications and taken to the bathroom and leave for an hour to just have a break.  Spent 20 minutes in educating patient and wife.  Paged social worker Vanice Sarah to make aware coming to see patient in clinic today.  Did add on order for CMV per PharmD Nedra Hai and Granix shot for low ANC.  Patient wife did say she would f/u with her PCP about chest pain.      After discussion with PharmD Nedra Hai over the phone and in person with NP [facility] patient and wife stated it would help to transition completely over to oral pill medication.  Sent scripts for pick up at outpatient pharmacy today for wife to get.  When reviewing the medications she seemed a little flustered so since patient is coming for scan tomorrow wife and patient agreed to bring all medications, orange card, and pill box tomorrow to review in clinic and confirm everything is set up correctly going forward.  Wife stated switching patient to oral medications would reduce her stress significantly.  Patient scheduled to see NP Daphine Deutscher at 2 pm 01/11/17 and emailed pharmacy to se if anyone can see him also tomorrow around that time.

## 2017-01-10 NOTE — Unmapped (Signed)
You are recovering well from a functional and rehabilitation standpoint.    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  -Talk with your transplant team about consideration of neuropathic pain reducing medicine, like Gabapentin or Lyrica to consider. Continue the Aspercreme with lidocain, heating pad, tramadol and tylenol as needed.    Soft voice/Dysphonia:  -Referral to the [facility] ENT Voice clinic for evaluation and treatment.    Return to PM&R clinic in about 3 months.

## 2017-01-11 ENCOUNTER — Inpatient Hospital Stay
Admission: EM | Admit: 2017-01-11 | Discharge: 2017-01-13 | Disposition: A | Discharge status: Home Health | Payer: MEDICARE | Source: Intra-hospital | Attending: Student in an Organized Health Care Education/Training Program | Admitting: Student in an Organized Health Care Education/Training Program

## 2017-01-11 ENCOUNTER — Inpatient Hospital Stay
Admission: EM | Admit: 2017-01-11 | Discharge: 2017-01-13 | Disposition: A | Discharge status: Home Health | Payer: MEDICARE | Source: Intra-hospital

## 2017-01-11 ENCOUNTER — Inpatient Hospital Stay
Admission: EM | Admit: 2017-01-11 | Discharge: 2017-01-13 | Disposition: A | Discharge status: Home Health | Source: Intra-hospital | Attending: Family

## 2017-01-11 DIAGNOSIS — Z4823 Encounter for aftercare following liver transplant: Principal | ICD-10-CM

## 2017-01-11 DIAGNOSIS — D702 Other drug-induced agranulocytosis: Secondary | ICD-10-CM

## 2017-01-11 DIAGNOSIS — D899 Disorder involving the immune mechanism, unspecified: Secondary | ICD-10-CM

## 2017-01-11 DIAGNOSIS — Z950 Presence of cardiac pacemaker: Secondary | ICD-10-CM

## 2017-01-11 DIAGNOSIS — K766 Portal hypertension: Secondary | ICD-10-CM

## 2017-01-11 DIAGNOSIS — R1110 Vomiting, unspecified: Secondary | ICD-10-CM

## 2017-01-11 DIAGNOSIS — I4891 Unspecified atrial fibrillation: Secondary | ICD-10-CM

## 2017-01-11 DIAGNOSIS — B259 Cytomegaloviral disease, unspecified: Secondary | ICD-10-CM

## 2017-01-11 DIAGNOSIS — G894 Chronic pain syndrome: Secondary | ICD-10-CM

## 2017-01-11 DIAGNOSIS — K219 Gastro-esophageal reflux disease without esophagitis: Secondary | ICD-10-CM

## 2017-01-11 DIAGNOSIS — Z944 Liver transplant status: Principal | ICD-10-CM

## 2017-01-11 DIAGNOSIS — Z8619 Personal history of other infectious and parasitic diseases: Secondary | ICD-10-CM

## 2017-01-11 DIAGNOSIS — I272 Pulmonary hypertension, unspecified: Secondary | ICD-10-CM

## 2017-01-11 DIAGNOSIS — R6889 Other general symptoms and signs: Secondary | ICD-10-CM

## 2017-01-11 DIAGNOSIS — E46 Unspecified protein-calorie malnutrition: Secondary | ICD-10-CM

## 2017-01-11 DIAGNOSIS — I2721 Secondary pulmonary arterial hypertension: Secondary | ICD-10-CM

## 2017-01-11 DIAGNOSIS — R112 Nausea with vomiting, unspecified: Principal | ICD-10-CM

## 2017-01-11 DIAGNOSIS — M961 Postlaminectomy syndrome, not elsewhere classified: Secondary | ICD-10-CM

## 2017-01-11 DIAGNOSIS — Z7982 Long term (current) use of aspirin: Secondary | ICD-10-CM

## 2017-01-11 DIAGNOSIS — Z9181 History of falling: Secondary | ICD-10-CM

## 2017-01-11 DIAGNOSIS — Z79899 Other long term (current) drug therapy: Secondary | ICD-10-CM

## 2017-01-11 DIAGNOSIS — Z8505 Personal history of malignant neoplasm of liver: Secondary | ICD-10-CM

## 2017-01-11 DIAGNOSIS — Z6821 Body mass index (BMI) 21.0-21.9, adult: Secondary | ICD-10-CM

## 2017-01-11 DIAGNOSIS — N400 Enlarged prostate without lower urinary tract symptoms: Secondary | ICD-10-CM

## 2017-01-11 DIAGNOSIS — E1142 Type 2 diabetes mellitus with diabetic polyneuropathy: Secondary | ICD-10-CM

## 2017-01-11 DIAGNOSIS — Z431 Encounter for attention to gastrostomy: Secondary | ICD-10-CM

## 2017-01-11 DIAGNOSIS — D649 Anemia, unspecified: Secondary | ICD-10-CM

## 2017-01-11 DIAGNOSIS — Z794 Long term (current) use of insulin: Secondary | ICD-10-CM

## 2017-01-11 LAB — CBC W/ AUTO DIFF
BASOPHILS ABSOLUTE COUNT: 0 10*9/L (ref 0.0–0.1)
EOSINOPHILS ABSOLUTE COUNT: 0 10*9/L (ref 0.0–0.4)
EOSINOPHILS ABSOLUTE COUNT: 0 10*9/L (ref 0.0–0.4)
HEMATOCRIT: 34.6 % — ABNORMAL LOW (ref 41.0–53.0)
HEMATOCRIT: 27.7 % — ABNORMAL LOW (ref 41.0–53.0)
HEMOGLOBIN: 11.4 g/dL — ABNORMAL LOW (ref 13.5–17.5)
HEMOGLOBIN: 9.1 g/dL — ABNORMAL LOW (ref 13.5–17.5)
LARGE UNSTAINED CELLS: 3 % (ref 0–4)
LARGE UNSTAINED CELLS: 2 % (ref 0–4)
LYMPHOCYTES ABSOLUTE COUNT: 0.4 10*9/L — ABNORMAL LOW (ref 1.5–5.0)
LYMPHOCYTES ABSOLUTE COUNT: 0.4 10*9/L — ABNORMAL LOW (ref 1.5–5.0)
MEAN CORPUSCULAR HEMOGLOBIN CONC: 32.8 g/dL (ref 31.0–37.0)
MEAN CORPUSCULAR HEMOGLOBIN CONC: 33 g/dL (ref 31.0–37.0)
MEAN CORPUSCULAR HEMOGLOBIN: 31.2 pg (ref 26.0–34.0)
MEAN CORPUSCULAR HEMOGLOBIN: 31.2 pg (ref 26.0–34.0)
MEAN CORPUSCULAR VOLUME: 95.1 fL (ref 80.0–100.0)
MEAN CORPUSCULAR VOLUME: 94.8 fL (ref 80.0–100.0)
MEAN PLATELET VOLUME: 8.8 fL (ref 7.0–10.0)
MEAN PLATELET VOLUME: 9 fL (ref 7.0–10.0)
MONOCYTES ABSOLUTE COUNT: 0.8 10*9/L (ref 0.2–0.8)
NEUTROPHILS ABSOLUTE COUNT: 6.1 10*9/L (ref 2.0–7.5)
PLATELET COUNT: 156 10*9/L (ref 150–440)
PLATELET COUNT: 132 10*9/L — ABNORMAL LOW (ref 150–440)
RED BLOOD CELL COUNT: 3.64 10*12/L — ABNORMAL LOW (ref 4.50–5.90)
RED BLOOD CELL COUNT: 2.92 10*12/L — ABNORMAL LOW (ref 4.50–5.90)
RED CELL DISTRIBUTION WIDTH: 18 % — ABNORMAL HIGH (ref 12.0–15.0)
RED CELL DISTRIBUTION WIDTH: 18.2 % — ABNORMAL HIGH (ref 12.0–15.0)
WBC ADJUSTED: 7.6 10*9/L (ref 4.5–11.0)
WBC ADJUSTED: 6.1 10*9/L (ref 4.5–11.0)

## 2017-01-11 LAB — COMPREHENSIVE METABOLIC PANEL
ALBUMIN: 3.6 g/dL (ref 3.5–5.0)
ALBUMIN: 3.4 g/dL — ABNORMAL LOW (ref 3.5–5.0)
ALKALINE PHOSPHATASE: 665 U/L — ABNORMAL HIGH (ref 38–126)
ALKALINE PHOSPHATASE: 550 U/L — ABNORMAL HIGH (ref 38–126)
ALT (SGPT): 261 U/L — ABNORMAL HIGH (ref 19–72)
ALT (SGPT): 225 U/L — ABNORMAL HIGH (ref 19–72)
ANION GAP: 10 mmol/L (ref 9–15)
ANION GAP: 12 mmol/L (ref 9–15)
AST (SGOT): 144 U/L — ABNORMAL HIGH (ref 19–55)
AST (SGOT): 111 U/L — ABNORMAL HIGH (ref 19–55)
BILIRUBIN TOTAL: 2 mg/dL — ABNORMAL HIGH (ref 0.0–1.2)
BILIRUBIN TOTAL: 1.6 mg/dL — ABNORMAL HIGH (ref 0.0–1.2)
BLOOD UREA NITROGEN: 21 mg/dL (ref 7–21)
BLOOD UREA NITROGEN: 23 mg/dL — ABNORMAL HIGH (ref 7–21)
BUN / CREAT RATIO: 14
BUN / CREAT RATIO: 14
CALCIUM: 9.4 mg/dL (ref 8.5–10.2)
CALCIUM: 9.1 mg/dL (ref 8.5–10.2)
CHLORIDE: 95 mmol/L — ABNORMAL LOW (ref 98–107)
CHLORIDE: 96 mmol/L — ABNORMAL LOW (ref 98–107)
CO2: 26 mmol/L (ref 22.0–30.0)
CREATININE: 1.54 mg/dL — ABNORMAL HIGH (ref 0.70–1.30)
CREATININE: 1.66 mg/dL — ABNORMAL HIGH (ref 0.70–1.30)
EGFR MDRD AF AMER: 55 mL/min/{1.73_m2} — ABNORMAL LOW (ref >=60)
EGFR MDRD AF AMER: 50 mL/min/{1.73_m2} — ABNORMAL LOW (ref >=60)
EGFR MDRD NON AF AMER: 45 mL/min/{1.73_m2} — ABNORMAL LOW (ref >=60)
EGFR MDRD NON AF AMER: 41 mL/min/{1.73_m2} — ABNORMAL LOW (ref >=60)
GLUCOSE RANDOM: 157 mg/dL (ref 65–179)
GLUCOSE RANDOM: 128 mg/dL (ref 65–179)
POTASSIUM: 3.9 mmol/L (ref 3.5–5.0)
PROTEIN TOTAL: 6.3 g/dL — ABNORMAL LOW (ref 6.5–8.3)
PROTEIN TOTAL: 6 g/dL — ABNORMAL LOW (ref 6.5–8.3)
SODIUM: 131 mmol/L — ABNORMAL LOW (ref 135–145)
SODIUM: 130 mmol/L — ABNORMAL LOW (ref 135–145)

## 2017-01-11 LAB — GAMMA GLUTAMYL TRANSFERASE: Gamma glutamyl transferase:CCnc:Pt:Ser/Plas:Qn:: 812 — ABNORMAL HIGH

## 2017-01-11 LAB — PHOSPHORUS
Phosphate:MCnc:Pt:Ser/Plas:Qn:: 3.1
Phosphate:MCnc:Pt:Ser/Plas:Qn:: 3.1

## 2017-01-11 LAB — LYMPHOCYTES ABSOLUTE COUNT: Lab: 0.4 — ABNORMAL LOW

## 2017-01-11 LAB — BILIRUBIN DIRECT: Bilirubin.glucuronidated:MCnc:Pt:Ser/Plas:Qn:: 1.2 — ABNORMAL HIGH

## 2017-01-11 LAB — LACTATE BLOOD VENOUS: Lactate:SCnc:Pt:BldV:Qn:: 1.2

## 2017-01-11 LAB — MEAN PLATELET VOLUME: Lab: 8.8

## 2017-01-11 LAB — CO2: Carbon dioxide:SCnc:Pt:Ser/Plas:Qn:: 26

## 2017-01-11 LAB — INR: Lab: 2.42

## 2017-01-11 LAB — AMMONIA: Ammonia:SCnc:Pt:Plas:Qn:: <9 — ABNORMAL LOW

## 2017-01-11 LAB — PROTEIN TOTAL: Protein:MCnc:Pt:Ser/Plas:Qn:: 6 — ABNORMAL LOW

## 2017-01-11 LAB — MAGNESIUM
Magnesium:MCnc:Pt:Ser/Plas:Qn:: 1.3 — ABNORMAL LOW
Magnesium:MCnc:Pt:Ser/Plas:Qn:: 1.3 — ABNORMAL LOW

## 2017-01-11 LAB — APTT: APTT: 40 s — ABNORMAL HIGH (ref 27.7–37.7)

## 2017-01-11 LAB — LIPASE: Triacylglycerol lipase:CCnc:Pt:Ser/Plas:Qn:: <10 — ABNORMAL LOW

## 2017-01-11 LAB — SMEAR REVIEW

## 2017-01-11 LAB — HEPARIN CORRELATION: Lab: 0.2

## 2017-01-11 LAB — DIGOXIN LEVEL: Digoxin:MCnc:Pt:Ser/Plas:Qn:: 0.8

## 2017-01-11 LAB — SLIDE REVIEW

## 2017-01-11 LAB — PROTIME-INR: PROTIME: 27.6 s — ABNORMAL HIGH (ref 10.2–12.8)

## 2017-01-11 MED ORDER — SULFAMETHOXAZOLE 400 MG-TRIMETHOPRIM 80 MG TABLET
ORAL_TABLET | ORAL | 0 refills | 0 days | Status: CP
Start: 2017-01-11 — End: 2017-06-22

## 2017-01-11 MED ORDER — SILDENAFIL (PULMONARY HYPERTENSION) 20 MG TABLET: tablet | 11 refills | 0 days

## 2017-01-11 MED ORDER — SILDENAFIL (PULMONARY HYPERTENSION) 20 MG TABLET
ORAL_TABLET | Freq: Three times a day (TID) | ORAL | 11 refills | 0 days | Status: CP
Start: 2017-01-11 — End: 2017-03-01

## 2017-01-11 MED FILL — SILDENAFIL CITRATE 2OMG/20MG/TAB: SILDENAFIL CITRATE 2OMG/20MG/TAB | 30 days supply | Qty: 180 | Fill #0

## 2017-01-11 NOTE — Unmapped (Signed)
TRANSPLANT SURGERY CLINIC NOTE    Assessment and Plan    Jeffrey Ward is a 65 y.o. male who underwent liver transplant on 09/15/2016 with complicated post-operative course. The patient is in clinic to review medications with pharmacy team and transition to PO medications in hopes of increasing his independence at home.    During visit patient reports has been vomiting for the last few days, denies any emesis today, and reports feelings of overall malaise. He also has a low grade fever. Wife reports he took tylenol a few hours ago and tramadol this morning for back pain. Rapid Influenza collected today, negative.    Patient is unable to share any information regarding his medications and his wife is quite confused with auditory processing of meedication lists. She is not using a pill box to organize meds and is not using an orange card but reports using check marks to ensure she gives him the most important meds; please see pharmacy note for additional information regarding the visit.    Overall, patient does not appear to be in acute distress or septic, stat CMP, CBC and CMV PCR checked. Labs, unfortunately, are acutely elevated with AST 144 from 54, ALT 261 from 89, Alk Phos 655 from 480, T. Bili 2 from 0.9; in addition to som hyponatremia and increased creat. Will admit patient, SRF aware.   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 Post-op course was complicated by decompensation secondary to acute right heart failure,??AKI requiring CRRT (resolved), severe pulmonary hypertension responsive to sildenafil, bradycardia/ asystole requiring pacemaker placement, atrial fibrilliation/ narrow complex tachycardia requiring anti-coagulation with warfarin and amiodarone/digoxin, malnutrition/debility, dysphagia requiring G tube placement, and altered mental status/delirium improved with transition to cyclosporine and psychiatric medications. He was discharged to [facility] 9/13 and discharged to home 12/10/16. He was readmitted 12/19/16 for increased LFT's and poor PO intake. Transplant liver ultrasound showed patent CBD and non-obstructing debris within the bile duct. His LFT's improved without intervention.      He had been rehabilitating as expected, slowly, and increasing PO intake as cleared by speech and swallow. He has been followed by cards and pulm for aforementioned issues. RN coordinator has been shared much concern for caregiving by wife who was unable to answer questions regarding alternative caregivers. The Doell family was asked to come to clinic to reconcile careplan and medications yesterday, 10/30, and it was identified that a transition to all PO medications and teaching medications to Jeffrey Ward was a feasible solution to the caregiver burden.    Jeffrey Ward is in clinic today to review medications with pharmacy team and transition to PO medications. See RN coordinator and SW note, wife has been struggling significantly to manage his medications and states she has been with him 24 hours and needs a break. Unfortunately, the patient is quite frail and has no knowledge of his medication regimen and doesn't know how to [place] his G-Tube.  During visit patient reports has been vomiting for the last few days, denies any emesis today, and reports feelings of overall malaise; these symptoms were not shared while in clinic or during communication with the RN coordinator. He and his wife feel that they have not vomited any IS medications. He also has a low grade fever, and wife reports he took tylenol a few hours ago.  Past Medical History    Past Medical History:   Diagnosis Date   ??? Cancer (CMS-HCC)    ??? Cirrhosis (CMS-HCC)    ??? Diabetes (CMS-HCC)    ??? Hepatitis C 07/17/2012   ??? Liver disease    ??? Low back pain 07/17/2012   ??? Varices, esophageal (CMS-HCC)      Past Surgical History    Past Surgical History:   Procedure Laterality Date   ??? ANKLE SURGERY     ??? BACK SURGERY     ??? BACK SURGERY     ??? CHG US GUIDE, TISSUE ABLATION N/A 01/22/2016    Procedure: ULTRASOUND GUIDANCE FOR, AND MONITORING OF, PARENCHYMAL TISSUE ABLATION;  Surgeon: Particia Nearing, MD;  Location: MAIN OR [facility];  Service: Transplant   ??? IR EMBOLIZATION ORGAN ISCHEMIA, TUMORS, INFAR  06/16/2016    IR EMBOLIZATION ORGAN ISCHEMIA, TUMORS, INFAR 06/16/2016 Ammie Dalton, MD IMG VIR [facility]   ??? PR COLON CA SCRN NOT HI RSK IND N/A 02/27/2015    Procedure: COLOREC CNCR SCR;COLNSCPY NO;  Surgeon: Vonda Antigua, MD;  Location: GI [facility]URES [facility];  Service: Gastroenterology   ??? PR ENDOSCOPIC ULTRASOUND EXAM N/A 02/27/2015    Procedure: UGI ENDO; W/ENDO ULTRASOUND EXAM INCLUDES ESOPHAGUS, STOMACH, &/OR DUODENUM/JEJUNUM;  Surgeon: Vonda Antigua, MD;  Location: GI PROC[facility]URES [facility];  Service: Gastroenterology   ??? PR ERCP STENT PLACEMENT BILIARY/PANCREATIC DUCT N/A 11/29/2016    Procedure: ENDOSCOPIC RETROGRADE CHOLANGIOPANCREATOGRAPHY (ERCP); WITH PLACEMENT OF ENDOSCOPIC STENT INTO BILIARY OR PANCREATIC DUCT;  Surgeon: Chriss Driver, MD;  Location: GI PROCEDURES [facility];  Service: Gastroenterology   ??? PR ERCP,W/REMOVAL STONE,BIL/PANCR DUCTS N/A 11/29/2016    Procedure: ERCP; W/ENDOSCOPIC RETROGRADE REMOVAL OF CALCULUS/CALCULI FROM BILIARY &/OR PANCREATIC DUCTS;  Surgeon: Chriss Driver, MD;  Location: GI PROC[facility]URES [facility];  Service: Gastroenterology   ??? PR INSER HEART TEMP PACER ONE CHMBR N/A 10/02/2016    Procedure: Tempoarary Pacemaker Insertion;  Surgeon: Meredith Leeds, MD;  Location: [facility];  Service: Cardiology   ??? PR LAP,DIAGNOSTIC ABDOMEN N/A 01/22/2016    Procedure: Laparoscopy, Abdomen, Peritoneum, & Omentum, Diagnostic, W/Wo Collection Specimen(S) By Brushing Or Washing;  Surgeon: Particia Nearing, MD;  Location: MAIN OR [facility];  Service: Transplant   ??? PR PLACE PERCUT GASTROSTOMY TUBE N/A 11/17/2016    Procedure: UGI ENDO; W/DIRECTED PLCMT PERQ GASTROSTOMY TUBE;  Surgeon: Cletis Athens, MD;  Location: GI PROC[facility]URES [facility];  Service: Gastroenterology   ??? PR TRACHEOSTOMY, PLANNED N/A 09/29/2016    Procedure: TRACHEOSTOMY PLANNED (SEPART PROC);  Surgeon: Katherina Mires, MD;  Location: [facility] OR [facility];  Service: Trauma   ??? PR TRANSCATH INSERT OR REPLACE LEADLESS PM VENTR N/A 10/11/2016    Procedure: Pacemaker Implant/Replace Leadless;  Surgeon: Meredith Leeds, MD;  Location: [facility];  Service: Cardiology   ??? PR TRANSPLANT LIVER,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    Procedure: Liver Allotransplantation; Orthotopic, Partial Or Whole, From Cadaver Or Living Donor, Any Age;  Surgeon: Doyce Loose, MD;  Location: MAIN OR [facility];  Service: Transplant   ??? PR TRANSPLANT,PREP DONOR LIVER, WHOLE N/A 09/15/2016    Procedure: Rogelia Boga Std Prep Cad Donor Whole Liver Gft Prior Tnsplnt,Inc Chole,Diss/Rem Surr Tissu Wo Triseg/Lobe Splt;  Surgeon: Doyce Loose, MD;  Location: MAIN OR [facility];  Service: Transplant   ??? PR UPPER GI ENDOSCOPY,BIOPSY N/A 07/17/2012    Procedure: UGI ENDOSCOPY; WITH BIOPSY, SINGLE OR MULTIPLE;  Surgeon: Alba Destine, MD;  Location: GI  PROCEDURES [facility];  Service: Gastroenterology   ??? PR UPPER GI ENDOSCOPY,DIAGNOSIS N/A 02/04/2014    Procedure: UGI ENDO, INCLUDE ESOPHAGUS, STOMACH, & DUODENUM &/OR JEJUNUM; DX W/WO COLLECTION SPECIMN, BY BRUSH OR WASH;  Surgeon: Wilburt Finlay, MD;  Location: GI PROCEDURES [facility];  Service: Gastroenterology   ??? PR UPPER GI ENDOSCOPY,LIGAT VARIX N/A 11/05/2013    Procedure: UGI ENDO; W/BAND LIG ESOPH &/OR GASTRIC VARICES;  Surgeon: Wilburt Finlay, MD;  Location: GI PROCEDURES [facility];  Service: Gastroenterology     MEDICATIONS:  Current Outpatient Prescriptions   Medication Sig Dispense Refill   ??? acetaminophen (TYLENOL) 160 mg/5 mL solution Take 10.2 mL (325 mg total) by mouth every four (4) hours as needed for pain. 300 mL 0   ??? amiodarone (PACERONE) 200 MG tablet 1 tablet (200 mg total) by G-tube route daily. 30 tablet 11   ??? blood sugar diagnostic (FREESTYLE TEST) Strp by Other route Four (4) times a day (before meals and nightly). 100 each 11   ??? blood-glucose meter (GLUCOSE MONITORING KIT) kit Use as instructed 1 each 0   ??? cholecalciferol, vitamin D3, 2,000 unit cap 1 capsule (2,000 Units total) by PEG Tube route daily. 30 capsule 11   ??? digoxin (LANOXIN) 125 mcg tablet 0.5 tablets (62.5 mcg total) by G-tube route daily. 15 tablet 11   ??? insulin ASPART (NOVOLOG FLEXPEN U-100 INSULIN) 100 unit/mL injection pen Inject 0.04 mL (4 Units total) under the skin Three (3) times a day before meals. 4 mL 11   ??? insulin ASPART (NOVOLOG FLEXPEN U-100 INSULIN) 100 unit/mL injection pen Inject 0-0.12 mL (0-12 Units total) under the skin Four (4) times a day (before meals and nightly). Sliding scale. 14.4 mL 0   ??? insulin glargine (LANTUS) 100 unit/mL (3 mL) injection pen Inject 0.12 mL (12 Units total) under the skin nightly. 4 mL 11   ??? lancets Misc 1 each by Miscellaneous route Four (4) times a day (before meals and nightly). 100 each 11   ??? magnesium oxide (MAG-OX) 400 mg (241.3 mg magnesium) tablet 1 tablet (400 mg total) by G-tube route Three (3) times a day. 90 tablet 11   ??? melatonin 3 mg Tab 1-2 tablets (3-6 mg total) by PEG Tube route nightly. 60 tablet 0   ??? mycophenolate (CELLCEPT) 200 mg/mL suspension 1.3 mL (260 mg total) by G-tube route Two (2) times a day. 175 mL 12   ??? mycophenolate (CELLCEPT) 250 mg capsule Take 1 capsule (250 mg total) by mouth Two (2) times a day. (Patient not taking: Reported on 01/10/2017) 60 capsule 11   ??? NEORAL 100 mg capsule Take 2 capsules (200 mg total) by mouth Two (2) times a day. 120 capsule 11   ??? omeprazole (PRILOSEC) 20 MG capsule Take 1 capsule (20 mg total) by mouth daily. 30 capsule 1   ??? pen needle, diabetic (ULTICARE PEN NEEDLE) 32 gauge x 5/32 Ndle 1 each by Miscellaneous route Four (4) times a day (before meals and nightly). 90 each 3   ??? SILACE 50 mg/5 mL liquid TAKE 10 MLS BY G-TUBE ROUTE TWICE DAILY AS NEEDED 473 mL PRN   ??? sildenafil, antihypertensive, (REVATIO) 20 mg tablet Take 2 tablets (40 mg total) by mouth Three (3) times a day. 180 tablet 11   ??? sulfamethoxazole-trimethoprim (BACTRIM,SEPTRA) 400-80 mg per tablet Take 1 tablet (80 mg of trimethoprim total) by mouth 3 (three) times a week. 27 tablet 0   ??? thiamine (  B-1) 100 MG tablet 1 tablet (100 mg total) by G-tube route daily. 30 tablet 11   ??? warfarin (COUMADIN) 3 MG tablet 1 tablet (3 mg total) by G-tube route daily with evening meal. 30 tablet 11     No current facility-administered medications for this visit.      ALLERGIES:  Patient has no known allergies.    Review of Systems  A 12 point review of systems was negative except for pertinent items noted in the HPI.     Physical Exam:   Blood pressure 139/63, pulse 90, temperature 37.3 ??C (99.1 ??F), temperature source Tympanic, height 149.9 cm (4' 11.02), weight 64 kg (141 lb).  Body mass index is 28.46 kg/m??.    General - NAD, breathing comfortably, somnolent but easily arusable  Neuro - alert, oriented, and interactive  Resp - breathing comfortably on room air  CV - sinus rhythm, hemodynamically normal/stable  Abd - soft, nondistended, nontender; Mercedes incision c/d/i, 74F G-tube in place.  Ext - warm and well perfused, uses walker    LABS:     Lab Results   Component Value Date    WBC 1.7 (L) 01/09/2017    HGB 10.7 (L) 01/09/2017    HCT 30.7 (L) 01/09/2017    PLT 136 (L) 01/09/2017     Lab Results   Component Value Date    NA 138 01/09/2017    K 4.0 01/09/2017    CL 106 01/09/2017    CO2 26.0 01/09/2017    BUN 20 01/09/2017    CREATININE 1.23 01/09/2017    CALCIUM 9.3 01/09/2017    MG 1.5 (L) 01/09/2017    PHOS 2.9 01/09/2017     Lab Results   Component Value Date    APTT 39.8 (H) 12/20/2016     Lab Results   Component Value Date    ALKPHOS 480 (H) 01/09/2017    BILITOT 0.9 01/09/2017    BILIDIR 0.40 01/06/2017    PROT 5.9 (L) 01/09/2017    ALBUMIN 3.5 01/09/2017    ALT 89 (H) 01/09/2017    AST 54 01/09/2017    GGT 170 (H) 12/26/2016       Imaging:   No results found.    _____________________________        Ermalinda Barrios, DNP, APRN, FNP-C  [facility]  [address]  [place], [place]  [address]

## 2017-01-11 NOTE — Unmapped (Signed)
Patient received Tylenol about an hour ago so given low grade fever no need for flu vaccine currently.  Patient vomiting all night long but has stopped now.  Also reported flu like symptom of aching.  Per NP Daphine Deutscher STAT labs ordered with CMV PCR and Rapid Flu PCR.

## 2017-01-11 NOTE — Unmapped (Signed)
Viral flu swab collected and sent to lab.

## 2017-01-12 DIAGNOSIS — I4891 Unspecified atrial fibrillation: Secondary | ICD-10-CM

## 2017-01-12 DIAGNOSIS — Z7982 Long term (current) use of aspirin: Secondary | ICD-10-CM

## 2017-01-12 DIAGNOSIS — Z950 Presence of cardiac pacemaker: Secondary | ICD-10-CM

## 2017-01-12 DIAGNOSIS — N400 Enlarged prostate without lower urinary tract symptoms: Secondary | ICD-10-CM

## 2017-01-12 DIAGNOSIS — E1142 Type 2 diabetes mellitus with diabetic polyneuropathy: Secondary | ICD-10-CM

## 2017-01-12 DIAGNOSIS — I272 Pulmonary hypertension, unspecified: Secondary | ICD-10-CM

## 2017-01-12 DIAGNOSIS — Z794 Long term (current) use of insulin: Secondary | ICD-10-CM

## 2017-01-12 DIAGNOSIS — Z4823 Encounter for aftercare following liver transplant: Principal | ICD-10-CM

## 2017-01-12 DIAGNOSIS — G894 Chronic pain syndrome: Secondary | ICD-10-CM

## 2017-01-12 DIAGNOSIS — Z8619 Personal history of other infectious and parasitic diseases: Secondary | ICD-10-CM

## 2017-01-12 DIAGNOSIS — K219 Gastro-esophageal reflux disease without esophagitis: Secondary | ICD-10-CM

## 2017-01-12 DIAGNOSIS — Z9181 History of falling: Secondary | ICD-10-CM

## 2017-01-12 DIAGNOSIS — Z431 Encounter for attention to gastrostomy: Secondary | ICD-10-CM

## 2017-01-12 DIAGNOSIS — E46 Unspecified protein-calorie malnutrition: Secondary | ICD-10-CM

## 2017-01-12 DIAGNOSIS — Z8505 Personal history of malignant neoplasm of liver: Secondary | ICD-10-CM

## 2017-01-12 DIAGNOSIS — Z6821 Body mass index (BMI) 21.0-21.9, adult: Secondary | ICD-10-CM

## 2017-01-12 DIAGNOSIS — D649 Anemia, unspecified: Secondary | ICD-10-CM

## 2017-01-12 DIAGNOSIS — M961 Postlaminectomy syndrome, not elsewhere classified: Secondary | ICD-10-CM

## 2017-01-12 LAB — COMPREHENSIVE METABOLIC PANEL
ALBUMIN: 2.8 g/dL — ABNORMAL LOW (ref 3.5–5.0)
ALKALINE PHOSPHATASE: 436 U/L — ABNORMAL HIGH (ref 38–126)
ALT (SGPT): 170 U/L — ABNORMAL HIGH (ref 19–72)
ANION GAP: 11 mmol/L (ref 9–15)
AST (SGOT): 60 U/L — ABNORMAL HIGH (ref 19–55)
BILIRUBIN TOTAL: 1.3 mg/dL — ABNORMAL HIGH (ref 0.0–1.2)
BLOOD UREA NITROGEN: 23 mg/dL — ABNORMAL HIGH (ref 7–21)
BUN / CREAT RATIO: 15
CALCIUM: 8.6 mg/dL (ref 8.5–10.2)
CO2: 23 mmol/L (ref 22.0–30.0)
CREATININE: 1.55 mg/dL — ABNORMAL HIGH (ref 0.70–1.30)
EGFR MDRD NON AF AMER: 45 mL/min/{1.73_m2} — ABNORMAL LOW (ref >=60)
GLUCOSE RANDOM: 96 mg/dL (ref 65–179)
POTASSIUM: 3.9 mmol/L (ref 3.5–5.0)
PROTEIN TOTAL: 5.2 g/dL — ABNORMAL LOW (ref 6.5–8.3)
SODIUM: 133 mmol/L — ABNORMAL LOW (ref 135–145)

## 2017-01-12 LAB — PHOSPHORUS: Phosphate:MCnc:Pt:Ser/Plas:Qn:: 3.3

## 2017-01-12 LAB — CBC
HEMATOCRIT: 26.9 % — ABNORMAL LOW (ref 41.0–53.0)
MEAN CORPUSCULAR HEMOGLOBIN CONC: 33.1 g/dL (ref 31.0–37.0)
MEAN CORPUSCULAR HEMOGLOBIN: 31.2 pg (ref 26.0–34.0)
MEAN CORPUSCULAR VOLUME: 94.3 fL (ref 80.0–100.0)
PLATELET COUNT: 111 10*9/L — ABNORMAL LOW (ref 150–440)
RED BLOOD CELL COUNT: 2.86 10*12/L — ABNORMAL LOW (ref 4.50–5.90)
RED CELL DISTRIBUTION WIDTH: 18.2 % — ABNORMAL HIGH (ref 12.0–15.0)
WBC ADJUSTED: 4 10*9/L — ABNORMAL LOW (ref 4.5–11.0)

## 2017-01-12 LAB — MAGNESIUM
MAGNESIUM: 1.3 mg/dL — ABNORMAL LOW (ref 1.6–2.2)
Magnesium:MCnc:Pt:Ser/Plas:Qn:: 1.3 — ABNORMAL LOW

## 2017-01-12 LAB — BILIRUBIN TOTAL: Bilirubin:MCnc:Pt:Ser/Plas:Qn:: 1.3 — ABNORMAL HIGH

## 2017-01-12 LAB — WBC ADJUSTED: Lab: 4 — ABNORMAL LOW

## 2017-01-12 LAB — HEPATITIS C ANTIBODY: Hepatitis C virus Ab:PrThr:Pt:Ser:Ord:: REACTIVE — AB

## 2017-01-12 LAB — HEPATITIS PANEL, ACUTE: HEPATITIS C ANTIBODY: REACTIVE — AB

## 2017-01-12 MED FILL — WARFARIN/3MG/TAB: WARFARIN/3MG/TAB | 30 days supply | Qty: 30 | Fill #0

## 2017-01-12 NOTE — Unmapped (Signed)
[facility]PIT[facility] TRANSPLANT CLINIC PHARMACY NOTE  01/11/2017   Jeffrey Ward  [id number]    Medication changes today:   1. No changes made today    Education/Adherence tools provided today:  1.provided additional education on immunosuppression and transplant related medications including reviewing indications of medications, dosing and side effects  2. provided updated medication list   3. Provided pillbox training and assisted in pillbox fill  4. Provided assistance with access to sildenafil.    Follow up items:  1. goal of understanding indications and dosing of immunosuppression medications   2. Follow up with patient's home BG levels and BP readings as patient forgot logs today.  3. Continue to monitor digoxin and warfarin levels.  4. Continue to monitor serum K and Mg with goal > 4.0 and 2.0, respectively.  5. Consider statin therapy at future clinic visit  6. Continue to monitor TSH and Free T4 every 3 months - consider referral to endocrine for further thyroid dysfunction work up.  7. F/u CMV PCR  8. Graft function as elevated LFT's in clinic today  9. Resolution of N/V that has persisted for ~5 days.    10. Results of ED workup    Next visit with pharmacy in Monday 11/5  ____________________________________________________________________    Jeffrey Ward is a 66 y.o. 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     Other PMH significant for diabetes.    HCV History: Chronic HCV, genotype 1A. Treated with Harvoni for 24 weeks. Start date: 04/24/2013. End date: 10/09/2013. SVR achieved.  ??  Post-op course complicated by cardiogenic shock due to right heart failure, a.fib/a.flutter, pulmonary hypertension, AKI requiring CRRT (now resolved), s/p permanent pacemaker placement, PEG placement on 11/17/16 for dysphagia.  ??  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 Patient did receives steroids at this time for presumed rejection.    Seen by pharmacy today for: medication management and adherence education; last seen by pharmacy two weeks ago     CC:  Patient complaints of low energy and nausea and vomitting that has persisted since the weekend.  Notably patient appeared to fall asleep several times during our visit.  Additionally, patients wife (who manages medications) reports some confusion with current medication regimen and asked for pillbox training.      Vitals:    01/11/17 1343   BP: 139/63   Pulse: 90   Temp: 37.6 ??C (99.7 ??F)       No Known Allergies    All medications reviewed and updated.     Medication list includes revisions made during today???s encounter    Facility-Administered Encounter Medications as of 01/11/2017   Medication Dose Route Frequency Provider Last Rate Last Dose   ??? [COMPLETED] tbo-filgrastim (GRANIX) injection 300 mcg  300 mcg Subcutaneous Once Ruth-Ann Maryln Manuel, CPP   300 mcg at 01/10/17 1348   ??? [COMPLETED] Tc-46m Macroagragated Albumin (MAA)  4 millicurie Intravenous Once Jettie Booze, MD   4 millicurie at 01/11/17 1220   ??? [COMPLETED] Xenon Xe-133 Gas  10.5 millicurie Inhalation Once Jettie Booze, MD   10.5 millicurie at 01/11/17 1210   ??? [DISCONTINUED] filgrastim (NEUPOGEN) injection 300 mcg  300 mcg Subcutaneous Once Ruth-Ann Maryln Manuel, CPP         Outpatient Encounter Prescriptions as of 01/11/2017   Medication Sig Dispense Refill   ??? acetaminophen (TYLENOL) 160 mg/5  mL solution Take 10.2 mL (325 mg total) by mouth every four (4) hours as needed for pain. 300 mL 0   ??? amiodarone (PACERONE) 200 MG tablet 1 tablet (200 mg total) by G-tube route daily. 30 tablet 11   ??? blood sugar diagnostic (FREESTYLE TEST) Strp by Other route Four (4) times a day (before meals and nightly). 100 each 11   ??? blood-glucose meter (GLUCOSE MONITORING KIT) kit Use as instructed 1 each 0   ??? cholecalciferol, vitamin D3, 2,000 unit cap 1 capsule (2,000 Units total) by PEG Tube route daily. 30 capsule 11   ??? digoxin (LANOXIN) 125 mcg tablet 0.5 tablets (62.5 mcg total) by G-tube route daily. 15 tablet 11   ??? insulin ASPART (NOVOLOG FLEXPEN U-100 INSULIN) 100 unit/mL injection pen Inject 0.04 mL (4 Units total) under the skin Three (3) times a day before meals. 4 mL 11   ??? insulin ASPART (NOVOLOG FLEXPEN U-100 INSULIN) 100 unit/mL injection pen Inject 0-0.12 mL (0-12 Units total) under the skin Four (4) times a day (before meals and nightly). Sliding scale. 14.4 mL 0   ??? insulin glargine (LANTUS) 100 unit/mL (3 mL) injection pen Inject 0.12 mL (12 Units total) under the skin nightly. 4 mL 11   ??? lancets Misc 1 each by Miscellaneous route Four (4) times a day (before meals and nightly). 100 each 11   ??? magnesium oxide (MAG-OX) 400 mg (241.3 mg magnesium) tablet 1 tablet (400 mg total) by G-tube route Three (3) times a day. 90 tablet 11   ??? melatonin 3 mg Tab 1-2 tablets (3-6 mg total) by PEG Tube route nightly. (Patient taking differently: 5 mg by PEG Tube route nightly. ) 60 tablet 0   ??? mycophenolate (CELLCEPT) 200 mg/mL suspension 1.3 mL (260 mg total) by G-tube route Two (2) times a day. 175 mL 12   ??? mycophenolate (CELLCEPT) 250 mg capsule Take 1 capsule (250 mg total) by mouth Two (2) times a day. (Patient not taking: Reported on 01/10/2017) 60 capsule 11   ??? NEORAL 100 mg capsule Take 2 capsules (200 mg total) by mouth Two (2) times a day. 120 capsule 11   ??? omeprazole (PRILOSEC) 20 MG capsule Take 1 capsule (20 mg total) by mouth daily. 30 capsule 1   ??? pen needle, diabetic (ULTICARE PEN NEEDLE) 32 gauge x 5/32 Ndle 1 each by Miscellaneous route Four (4) times a day (before meals and nightly). 90 each 3   ??? SILACE 50 mg/5 mL liquid TAKE 10 MLS BY G-TUBE ROUTE TWICE DAILY AS NEEDED 473 mL PRN   ??? sulfamethoxazole-trimethoprim (BACTRIM,SEPTRA) 400-80 mg per tablet Take 1 tablet (80 mg of trimethoprim total) by mouth 3 (three) times a week. 27 tablet 0   ??? thiamine (B-1) 100 MG tablet 1 tablet (100 mg total) by G-tube route daily. 30 tablet 11   ??? warfarin (COUMADIN) 3 MG tablet 1 tablet (3 mg total) by G-tube route daily with evening meal. 30 tablet 11   ??? [DISCONTINUED] sildenafil, antihypertensive, (REVATIO) 20 mg tablet 2 tablets (40 mg total) by G-tube route Three (3) times a day. Dissolve 1 1/2 tablets in water and administer via PEG tube. 180 tablet 6     Induction agent : steroids only    CURRENT IMMUNOSUPPRESSION:   cyclosporine 200 mg capsules BID, cyclosporine goal: 150-200   cellcept 500 mg (2.5 mL) via PEG tube BID   steroid free    Patient complains of nausea and vomiting.  10/21/16 - patient converted from tacrolimus to cyclosporine due to neurotoxicity (jerks, tremors).    IMMUNOSUPPRESSION DRUG LEVELS:  Lab Results   Component Value Date    CYCLO 181 01/09/2017    CYCLO 255 01/06/2017    CYCLO 237 01/02/2017     CSA level is accurate 12 hour trough.    Graft function: worsening  DSA: ntd  Biopsies to date: 11/04/2016 - no rejection indicated  WBC/ANC:  wnl    Plan: Cellcept was changed to capsules, but patients wife had not yet picked them up.  Instructed her to continue using the suspension until able to get cellcept capsules from [facility]. Labs today indicate worsening LFT's and he was admitted to the ED night of 01/11/17.    ID prophylaxis:   CMV Status: D+/ R+, moderate risk . CMV prophylaxis: valganciclovir 450 mg daily x 3 months per protocol. Completed  No results found for: CMVCP   CrCl cannot be calculated (Unknown ideal weight.).  PCP Prophylaxis: bactrim SS 1 tab MWF x 6 months. Stop date 03/18/2017.  Patient is  tolerating infectious prophylaxis well    Plan: Continue Bactrim per protocol.     CV Prophylaxis: asa 81 mg   The 10-year ASCVD risk score Denman George DC Jr., et al., 2013) is: 28.3%  Statin therapy: Indicated; consider statin initiation at future visit as patient is indicated for high intensity statin therapy. As patient's wife, who manages current medications, has had issues with complexity of current regimen did not initiate today.  Consider starting a statin once adherence to current regimen has been attained.      BP: Goal < 140/90. Clinic vitals reported above  Home BP ranges: Patient unable to report - BP log at home  Current meds include: None at this time.  Plan: Patients wife reports his BP's are all over the place and did not bring logs.  Recommend contacting patient's wife after discharged from ED to verify logs.    Atrial fibrillation:  Medications currently on: amiodarone 200 mg daily, digoxin 62.5 mcg daily and warfarin 3 mg qHS  Digoxin level: 0.8 (10/31)  INR: 2.42, goal 2-3  TSH: 8.26, Free T4: 1.98  Plan:  Continue warfarin 3mg  qHS, continue current digoxin dose.  Continue to monitor serum K and Mg and replace up to 4.0 and 2.0, respectively, to reduce risk of arrhythmia.  Consider endocrine referral for potential amiodarone-induced thyroid dysfunction.  Continue to monitor TSH and free T4 every 3 months and PFTs/CXR annually. Encourage annual eye exam.    Pulmonary Hypertension:  Meds currently on: sildenafil 40 mg TID  Plan: Patient's wife reports he is out of this medication following a dose increase at last hypertension clinic visit.  Refill sent to central outpatient pharmacy for her to pick up prior to leaving today.      Anemia:  H/H:   Lab Results   Component Value Date    HGB 9.1 (L) 01/11/2017     Lab Results   Component Value Date    HCT 27.7 (L) 01/11/2017     Iron panel:  Lab Results   Component Value Date    IRON 169 (H) 11/24/2015    TIBC 190.8 (L) 11/24/2015    FERRITIN 286.0 11/24/2015     Lab Results   Component Value Date    LABIRON 89 (H) 11/24/2015     Prior ESA use: NTD  Plan: within goal. Continue to monitor.     DM:   Lab Results  Component Value Date    A1C 5.1 12/26/2016   Goal A1c < 7  History of Dm? 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 Will confirm with patient when follow up on home BG levels  Currently on: Lantus 12 units qHS, Novolog 4 units with meals + sliding scale insulin  Home BS log: Patient did not bring BG log to clinic today  Hypoglycemia: no  Plan:  Will follow up with patient regarding home BG levels.    Electrolytes: K: 4.2, Mg: 1.3, Na: 130  Meds currently on: Magnesium oxide 400 mg TID.  Patient has a  Plan: Electrolytes did not result until after clinic today, patient was admitted for elevated LFT's.  Recommend patient increase magnesium supplementation if levels do not improve following resolution of vomitting.      BM: Patient is having between 1-3 BM daily, BM are not formed but are not described as diarrhea.  Patient reports vomitting several times a day beginning over the weekend.    Meds currently on: Patient has docusate and Miralax at home but is not currently using.  Plan: Rapid flu negative, CMV panel pending.  Patient admitted to ED for additional workup.    Pain: Patient complains of lower back pain which he treats with tylenol once a day and intermittent tramadol  Meds currently on: Acetaminophen 325 mg PRN, tramadol 50 mg PRN  Plan: Encouraged patient to use APAP if able to at night. Educated on maximum dose of APAP.      Bone health:   Vitamin D Level: last level is 34.2. Goal > 30.   Last DEXA results: 11/30/2015 scan indicated low bone density  Current meds include: cholecalciferol 2,000 units daily  Plan: Vitamin D level  within goal,continue supplementation. Continue to monitor.     Heartburn: Patient complains of frequent heartburn  Meds currently on: omeprazole 20 mg capsules  Plan: Wife is now giving this medication orally as patient is able to swallow. CTM    Insomnia:  Meds currently on: melatonin 3-6 mg nightly  Plan: Patient and wife report melatonin has helped with sleep. However patient reports vomiting this week has limited his ability to rest.  Continue current therapy.    Women's/Men's Health:  Jeffrey Ward is a 66 y.o. male. Patient reports no men's/women's health issues  Plan: Continue to monitor.    Dysphagia: Pt and wife have requested transition of as many meds as possible to pill/capsule form.  Confirmed that she would contact [facility] to begin receiving cellcept capsules to complete conversion to oral medications.    Adherence: Patient has poor understanding of medications; was not able to independently identify names/doses of immunosuppressants and OI meds. Patient's wife primarily manages medications and she has an average understanding of his medications.  Patient  does not fill their own pill box on a regular basis at home. Wife presented to clinic today with an empty pillbox and stated that up to this point she has never used it.      Patient brought medication card:yes, however medication card was a previous version. Requested patient bring the current version to future clinic visits.  Pill ZOX:WRUEAVW it with her, but it was empty   Patient requested refills for the following meds: None  Corrections needed in Epic medication list: None   Plan: Patient and wife are currently overwhelmed by the number of medications.     Spent majority of the visit sitting down with patient and  wife going through list of medications and having her fill the pillbox under observation of pharmacy staff to confirm technique and accuracy.  Provided updated medication list and marked the medications she would need additional supply of to complete weekly fill.  Plan to see patient 11/5 to recheck pillbox.        Spent approximately 50 minutes on educating this patient and greater than 50% was spent in direct face to face counseling regarding post transplant medication education. Questions and concerns were address to patient's satisfaction.    Patient was reviewed with Dr.Desai who was agreement with the stated plan:     During this visit, the following was completed:   BG log data assessment  BP log data assessment  Labs ordered and evaluated complex treatment plan >1 DS    Patient education was completed for 25-60 minutes     All questions/concerns were addressed to the patient's satisfaction.    Dennie Bible PY4 PharmD Candidate  __________________________________________  Noah Charon, PHARMD, CPP  [employer] TRANSPLANT  PAGER [phone]

## 2017-01-12 NOTE — Unmapped (Signed)
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 He was readmited from clinic yesterday due to a few days of vomiting, anorexia, and mild diarrhea.     Interval Events:  Pain was well controlled overnight. He is still very weak so PT has been consulted to see him. He is making marginal urine output so he remains on mIVF. He is tolerating PO intake.     Plan     Neuro/Pain:   - Oxycodone PRN    Cardio:   - HDS, on digoxin    Pulmonary:   - OOB, IS    FEN/GI:   - 100 NS  - Mag ox 400 TID  - Reg diet  - PPI  - Zofran    GU/Renal:   - Marginal UOP    Heme/ID:   - Afebrile    Endocrine:   - Stable    Prophylaxis:   - SQH    Immunology:   - On all of his anti-rejection medications.    Disposition: Floor status.     Please page SRF [phone] with questions or concerns.    Objective     Vitals:   Temp:  [36.6 ??C-38.2 ??C] 36.9 ??C  Heart Rate:  [76-104] 76  SpO2 Pulse:  [80-95] 95  Resp:  [16-23] 18  BP: (115-149)/(59-76) 123/59  MAP (mmHg):  [78] 78  SpO2:  [95 %-99 %] 95 %    Intake/Output last 24 hours:  I/O last 3 completed shifts:  In: 1125 [P.O.:225; I.V.:400; IV Piggyback:500]  Out: 350 [Urine:350]    Physical Exam:    General Appearance:   No acute distress, seems very fatigued  Neuro:   No focal neurological deficits  HEENT:  Moist mucous membranes  Lungs:                Normal work of breathing on room air  Heart:                           Regular rate and rhythm  Abdomen:                Soft, non-tender, non-distended  Extremities:              Warm and well perfused  -----------------------------------------------------    Data Review:  Lab Results   Component Value Date    WBC 4.0 (L) 01/12/2017    HGB 8.9 (L) 01/12/2017    HCT 26.9 (L) 01/12/2017    PLT 111 (L) 01/12/2017       Lab Results   Component Value Date    NA 133 (L) 01/12/2017    K 3.9 01/12/2017    CL 99 01/12/2017    CO2 23.0 01/12/2017    BUN 23 (H) 01/12/2017    CREATININE 1.55 (H) 01/12/2017    GLU 96 01/12/2017    CALCIUM 8.6 01/12/2017    MG 1.3 (L) 01/12/2017    PHOS 3.3 01/12/2017       Lab Results   Component Value Date    BILITOT 1.3 (H) 01/12/2017    BILIDIR 1.20 (H) 01/11/2017    PROT  5.2 (L) 01/12/2017    ALBUMIN 2.8 (L) 01/12/2017    ALT 170 (H) 01/12/2017    AST 60 (H) 01/12/2017    ALKPHOS 436 (H) 01/12/2017    GGT 812 (H) 01/11/2017       Lab Results   Component Value Date    LABPROT 14.5 (H) 01/09/2014    INR 2.42 01/11/2017    APTT 40.0 (H) 01/11/2017       Shelly Rubenstein, MD  Department of Surgery, PGY1  SRF P: [phone]

## 2017-01-12 NOTE — Unmapped (Signed)
Assumed care of pt a&o x4, lethargic, resp even and non-labored, skin w&d and slightly jaundiced. VSS, here with worsening abd pain and N/V x 4 days. Hx of liver transplant in July. NAD, rates pain 6/10, will notify MD and obtain pain and nausea meds. Call bell within reach, wife at bedside. Additional labs drawn and sent as to MD order

## 2017-01-12 NOTE — Unmapped (Signed)
Per outpt coord, pt's wife verbalized not feeling well yesterday and indicated heart related. Spoke with pt and wife today. Wife reported feeling better today as she rested yesterday. Enc wife to call her cardiologist. Wife verbalized she just saw him last month and is okay, started taking her plavix again (she thinks she went a month without). Reiterated to wife that she needs to take care of herself so she is able to care for pt. Requested contact numbers of her sister, Ro-An and son, Wyndell (sp?) so we may contact other caregivers in event wife is sick as well as needing to take time for herself. Wife reported she forgot her phone at home. Told wife to please call us once back home and give [place] the information. Wife reported she would do so.     Per outpt coord, pt and wife having difficulty managing pill box. Wife did not bring pill box or pills today. Requested she bring them tomorrow so we may ensure pill count is correct with orange card. Wife verbalized she will bring pill box back and also stated it is much easier now with him being on pills, versus liquids through PEG tube.      Reviewed upcoming appt's on Monday, Nov 5 with pt and wife in prep for discharge tomorrow. They both verbalize understanding. HH to continue with labs, nurse care, PT arranged by CM  Caryl Ada Inpatient Transplant Nurse Cordinator 01/12/2017 4:10 PM

## 2017-01-12 NOTE — Unmapped (Signed)
Report called to Gigi Gin, RN on [employer]

## 2017-01-12 NOTE — Unmapped (Signed)
Adult Nutrition Consult  - Brief    Reason for Visit:  Assessment/Check In - no consult ordered    ASSESSMENT:   HPI & PMH: Pt is a 66 yo male s/p OLT 09/15/16 who presented from clinic on 10/31 with rising LFTs. Likely d/c tmrw.   Past Medical History:   Diagnosis Date   ??? Cancer (CMS-HCC)    ??? Cirrhosis (CMS-HCC)    ??? Diabetes (CMS-HCC)    ??? Hepatitis C 07/17/2012   ??? Liver disease    ??? Low back pain 07/17/2012   ??? Varices, esophageal (CMS-HCC)    Nutrition Hx: Pt reports good appetite & intake today, amenable to supplements. Experienced nausea, vomiting, diarrhea and decreased appetite for <24 hrs PTA; symptoms have since resolved. He has no nutrition-related questions or concerns today. Wife not at bedside.  Nutritionally Pertinent Meds: Reviewed; Vit D3, mag-ox  Labs: Reviewed; Mg 1.3, glucose POC WNL  Abd/GI: last documented BM 11/1 x 1  Skin: see below  Patient Lines/Drains/Airways Status    Active Wounds     None               Current nutrition therapy order:   Nutrition Orders          Supplement Standard High Calorie; # of Products PER Serving: 1 4xd PCHS starting at 11/01 1800    Nutrition Therapy General (Regular) starting at 10/31 1940         Anthropometric Data:  -- Height: 149.9 cm (4' 11.02) - incorrect. Pt last recorded at 172.7 cm   -- Last recorded weight: 68.2 kg (150 lb 5.7 oz)  -- Admission weight: 68.2 kg  -- IBW: 48.18 kg  -- Percent IBW: ~141%  -- BMI: Body mass index is 30.35 kg/m??. - unreliable 2/2 incorrect height recording. True BMI = 22.9  -- Weight changes this admission:   Last 5 Recorded Weights    01/12/17 0407   Weight: 68.2 kg (150 lb 5.7 oz)      -- Weight history PTA: suspect weight recorded today is inaccurate. Pt usually hovers around ~140 lbs and has been maintaining.   Wt Readings from Last 10 Encounters:   01/12/17 68.2 kg (150 lb 5.7 oz)   01/11/17 64 kg (141 lb)   01/11/17 64 kg (141 lb)   01/10/17 64.4 kg (141 lb 15.6 oz)   01/06/17 64.4 kg (142 lb)   01/06/17 63.5 kg (140 lb)   12/26/16 63.5 kg (140 lb)   12/26/16 63.5 kg (140 lb)   12/19/16 63 kg (139 lb)   12/16/16 63 kg (139 lb)      Daily Estimated Nutrient Needs:   Energy: 1920-2240 kcals [30-35 kcal/kg using estimated dry weight, 64 kg (01/12/17 1611)]  Protein: 63-76 gm [1.0-1.2 gm/kg using estimated dry weight, 64 kg (01/12/17 1611)]  Carbohydrate:   [< / equal to 45% of kcal]  Fluid:   [per MD team]     Overall nutrition impression: suspect pt will meet nutrient needs as GI symptoms have resolved. He has been eating very well at home and has successfully maintained his weight. Administers supplemental tube feeds as needed. Amenable to Ensure TID until intake returns to baseline.     GOALS:  Oral Intake:       - Patient to consume >50% of 3 meals per day.  - Patient to consume >75% of 3 oral supplements per day.  Anthropometric:       - Maintain UBW throughout hospitalization  RECOMMENDATIONS AND INTERVENTIONS:  Recommend continue current nutrition therapy of Regular Diet  Recommend oral nutrition supplements : Ensure Plus TID (pt prefers strawberry)  Request twice weekly weight(s)   Continue mag-ox, Vit D as ordered    RD Follow Up Parameters:  1-2 times per week (and more frequent as indicated)     Jackqulyn Livings MPH, RD, LDN  Pager: [phone]

## 2017-01-12 NOTE — Unmapped (Signed)
Report given to [facility]. Patient care transferred at this time.

## 2017-01-12 NOTE — Unmapped (Signed)
Surgery History and Physical    Assessment/Plan:  Jeffrey Ward is a 66 y.o. male who presents with vomiting and increased LFTs.    - Admit to SRF  - regular diet  - bolus per ED, to start MIVF  - Routine labs  - Home meds ordered    History of Present Illness:  Jeffrey Ward is a 69M with IDDM, GERD, prior perioperative a-fib with RVR and cirrhosis secondary to chronic hepatitis C and HCC who underwent liver transplantation on 09/15/16. Post-op course was complicated by decompensation secondary to acute right heart failure,??AKI requiring CRRT (resolved), severe pulmonary hypertension responsive to sildenafil, bradycardia/ asystole requiring pacemaker placement, atrial fibrilliation/ narrow complex tachycardia requiring anti-coagulation with warfarin and amiodarone/digoxin, malnutrition/debility, dysphagia requiring G tube placement, and altered mental status/delirium improved with transition to cyclosporine and psychiatric medications. He was discharged to [facility] 9/13 and discharged to home 12/10/16. He was readmitted 12/19/16 for increased LFT's and poor PO intake. Transplant liver ultrasound showed patent CBD and non-obstructing debris within the bile duct. His LFT's improved without intervention. He had been rehabilitating as expected, slowly, and increasing PO intake as cleared by speech and swallow.     At a clinic visit today, he reported vomiting for the past few days, with resolution of vomiting today. Flu test was negative. He was found to have elevated AST to 144 and ALT to 261, T bili 2.0. Patient was admitted from clinic to hospital. Upon arrival, patient shares that he has been experiencing decreased PO intake for over a week, with low appetite. Patient currently has a G tube but has transitioned to an oral diet (wtih the exception of pills) that he was previously tolerating well before this episode. He endorses abdominal pain in his upper quadrants. His stools have been soft, clay-colored during that time. No diarrhea. He further denies fevers, chills, chest pain, shortness of breath, changes in urine (no darkening, odor) or urinary habits.  He reports that he has a tremor at baseline.      Allergies  Patient has no known allergies.    Medications    Current Facility-Administered Medications   Medication Dose Route Frequency Provider Last Rate Last Dose   ??? MORPhine 4 mg/mL injection 4 mg  4 mg Intravenous Once Raynald Blend, MD       ??? ondansetron [facility]) injection 4 mg  4 mg Intravenous Once Raynald Blend, MD         Current Outpatient Prescriptions   Medication Sig Dispense Refill   ??? acetaminophen (TYLENOL) 160 mg/5 mL solution Take 10.2 mL (325 mg total) by mouth every four (4) hours as needed for pain. 300 mL 0   ??? amiodarone (PACERONE) 200 MG tablet 1 tablet (200 mg total) by G-tube route daily. 30 tablet 11   ??? blood sugar diagnostic (FREESTYLE TEST) Strp by Other route Four (4) times a day (before meals and nightly). 100 each 11   ??? blood-glucose meter (GLUCOSE MONITORING KIT) kit Use as instructed 1 each 0   ??? cholecalciferol, vitamin D3, 2,000 unit cap 1 capsule (2,000 Units total) by PEG Tube route daily. 30 capsule 11   ??? digoxin (LANOXIN) 125 mcg tablet 0.5 tablets (62.5 mcg total) by G-tube route daily. 15 tablet 11   ??? insulin ASPART (NOVOLOG FLEXPEN U-100 INSULIN) 100 unit/mL injection pen Inject 0.04 mL (4 Units total) under the skin Three (3) times a day before meals. 4 mL 11   ??? insulin ASPART (NOVOLOG FLEXPEN U-100 INSULIN) 100  unit/mL injection pen Inject 0-0.12 mL (0-12 Units total) under the skin Four (4) times a day (before meals and nightly). Sliding scale. 14.4 mL 0   ??? insulin glargine (LANTUS) 100 unit/mL (3 mL) injection pen Inject 0.12 mL (12 Units total) under the skin nightly. 4 mL 11   ??? lancets Misc 1 each by Miscellaneous route Four (4) times a day (before meals and nightly). 100 each 11   ??? magnesium oxide (MAG-OX) 400 mg (241.3 mg magnesium) tablet 1 tablet (400 mg total) by G-tube route Three (3) times a day. 90 tablet 11   ??? melatonin 3 mg Tab 1-2 tablets (3-6 mg total) by PEG Tube route nightly. 60 tablet 0   ??? mycophenolate (CELLCEPT) 200 mg/mL suspension 1.3 mL (260 mg total) by G-tube route Two (2) times a day. 175 mL 12   ??? mycophenolate (CELLCEPT) 250 mg capsule Take 1 capsule (250 mg total) by mouth Two (2) times a day. (Patient not taking: Reported on 01/10/2017) 60 capsule 11   ??? NEORAL 100 mg capsule Take 2 capsules (200 mg total) by mouth Two (2) times a day. 120 capsule 11   ??? omeprazole (PRILOSEC) 20 MG capsule Take 1 capsule (20 mg total) by mouth daily. 30 capsule 1   ??? pen needle, diabetic (ULTICARE PEN NEEDLE) 32 gauge x 5/32 Ndle 1 each by Miscellaneous route Four (4) times a day (before meals and nightly). 90 each 3   ??? SILACE 50 mg/5 mL liquid TAKE 10 MLS BY G-TUBE ROUTE TWICE DAILY AS NEEDED 473 mL PRN   ??? sildenafil, antihypertensive, (REVATIO) 20 mg tablet Take 2 tablets (40 mg total) by mouth Three (3) times a day. 180 tablet 11   ??? sulfamethoxazole-trimethoprim (BACTRIM,SEPTRA) 400-80 mg per tablet Take 1 tablet (80 mg of trimethoprim total) by mouth 3 (three) times a week. 27 tablet 0   ??? thiamine (B-1) 100 MG tablet 1 tablet (100 mg total) by G-tube route daily. 30 tablet 11   ??? warfarin (COUMADIN) 3 MG tablet 1 tablet (3 mg total) by G-tube route daily with evening meal. 30 tablet 11       Past Medical History  Past Medical History:   Diagnosis Date   ??? Cancer (CMS-HCC)    ??? Cirrhosis (CMS-HCC)    ??? Diabetes (CMS-HCC)    ??? Hepatitis C 07/17/2012   ??? Liver disease    ??? Low back pain 07/17/2012   ??? Varices, esophageal (CMS-HCC)        Past Surgical History  Past Surgical History:   Procedure Laterality Date   ??? ANKLE SURGERY     ??? BACK SURGERY     ??? BACK SURGERY     ??? CHG US GUIDE, TISSUE ABLATION N/A 01/22/2016    Procedure: ULTRASOUND GUIDANCE FOR, AND MONITORING OF, PARENCHYMAL TISSUE ABLATION;  Surgeon: Particia Nearing, MD;  Location: MAIN OR [facility];  Service: Transplant   ??? IR EMBOLIZATION ORGAN ISCHEMIA, TUMORS, INFAR  06/16/2016    IR EMBOLIZATION ORGAN ISCHEMIA, TUMORS, INFAR 06/16/2016 Ammie Dalton, MD IMG VIR H&V [facility]   ??? PR COLON CA SCRN NOT HI RSK IND N/A 02/27/2015    Procedure: COLOREC CNCR SCR;COLNSCPY NO;  Surgeon: Vonda Antigua, MD;  Location: GI PROCEDURES MEMORIAL [facility];  Service: Gastroenterology   ??? PR ENDOSCOPIC ULTRASOUND EXAM N/A 02/27/2015    Procedure: UGI ENDO; W/ENDO ULTRASOUND EXAM INCLUDES ESOPHAGUS, STOMACH, &/OR DUODENUM/JEJUNUM;  Surgeon: Vonda Antigua, MD;  Location: GI PROCEDURES MEMORIAL  [facility];  Service: Gastroenterology   ??? PR ERCP STENT PLACEMENT BILIARY/PANCREATIC DUCT N/A 11/29/2016    Procedure: ENDOSCOPIC RETROGRADE CHOLANGIOPANCREATOGRAPHY (ERCP); WITH PLACEMENT OF ENDOSCOPIC STENT INTO BILIARY OR PANCREATIC DUCT;  Surgeon: Chriss Driver, MD;  Location: GI PROCEDURES MEMORIAL [facility];  Service: Gastroenterology   ??? PR ERCP,W/REMOVAL STONE,BIL/PANCR DUCTS N/A 11/29/2016    Procedure: ERCP; W/ENDOSCOPIC RETROGRADE REMOVAL OF CALCULUS/CALCULI FROM BILIARY &/OR PANCREATIC DUCTS;  Surgeon: Chriss Driver, MD;  Location: GI PROCEDURES MEMORIAL [facility];  Service: Gastroenterology   ??? PR INSER HEART TEMP PACER ONE CHMBR N/A 10/02/2016    Procedure: Tempoarary Pacemaker Insertion;  Surgeon: Meredith Leeds, MD;  Location: [facility];  Service: Cardiology   ??? PR LAP,DIAGNOSTIC ABDOMEN N/A 01/22/2016    Procedure: Laparoscopy, Abdomen, Peritoneum, & Omentum, Diagnostic, W/Wo Collection Specimen(S) By Brushing Or Washing;  Surgeon: Particia Nearing, MD;  Location: MAIN OR [facility];  Service: Transplant   ??? PR PLACE PERCUT GASTROSTOMY TUBE N/A 11/17/2016    Procedure: UGI ENDO; W/DIRECTED PLCMT PERQ GASTROSTOMY TUBE;  Surgeon: Cletis Athens, MD;  Location: GI PROCEDURES MEMORIAL [facility];  Service: Gastroenterology   ??? PR TRACHEOSTOMY, PLANNED N/A 09/29/2016    Procedure: TRACHEOSTOMY PLANNED (SEPART PROC);  Surgeon: Katherina Mires, MD;  Location: MAIN OR [facility];  Service: Trauma   ??? PR TRANSCATH INSERT OR REPLACE LEADLESS PM VENTR N/A 10/11/2016    Procedure: Pacemaker Implant/Replace Leadless;  Surgeon: Meredith Leeds, MD;  Location: [facility];  Service: Cardiology   ??? PR TRANSPLANT LIVER,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    Procedure: Liver Allotransplantation; Orthotopic, Partial Or Whole, From Cadaver Or Living Donor, Any Age;  Surgeon: Doyce Loose, MD;  Location: MAIN OR [facility];  Service: Transplant   ??? PR TRANSPLANT,PREP DONOR LIVER, WHOLE N/A 09/15/2016    Procedure: Rogelia Boga Std Prep Cad Donor Whole Liver Gft Prior Tnsplnt,Inc Chole,Diss/Rem Surr Tissu Wo Triseg/Lobe Splt;  Surgeon: Doyce Loose, MD;  Location: MAIN OR [facility];  Service: Transplant   ??? PR UPPER GI ENDOSCOPY,BIOPSY N/A 07/17/2012    Procedure: UGI ENDOSCOPY; WITH BIOPSY, SINGLE OR MULTIPLE;  Surgeon: Alba Destine, MD;  Location: GI PROCEDURES MEMORIAL [facility];  Service: Gastroenterology   ??? PR UPPER GI ENDOSCOPY,DIAGNOSIS N/A 02/04/2014    Procedure: UGI ENDO, INCLUDE ESOPHAGUS, STOMACH, & DUODENUM &/OR JEJUNUM; DX W/WO COLLECTION SPECIMN, BY BRUSH OR WASH;  Surgeon: Wilburt Finlay, MD;  Location: GI PROCEDURES MEMORIAL [facility];  Service: Gastroenterology   ??? PR UPPER GI ENDOSCOPY,LIGAT VARIX N/A 11/05/2013    Procedure: UGI ENDO; W/BAND LIG ESOPH &/OR GASTRIC VARICES;  Surgeon: Wilburt Finlay, MD;  Location: GI PROCEDURES MEMOR[facility];  Service: Gastroenterology       Family History  The patient's family history includes Hypertension in his mother..    Social History:  Social History     Social History   ??? Marital status: Married     Spouse name: N/A   ??? Number of children: N/A   ??? Years of education: N/A     Occupational History   ??? Not on file.     Social History Main Topics   ??? Smoking status: Never Smoker   ??? Smokeless tobacco: Never Used      Comment: Smoked in high school for about 5 years.    ??? Alcohol use No   ??? Drug use: No   ??? Sexual activity: Yes     Partners: Female     Birth control/ protection: None  Other Topics Concern   ??? Not on file     Social History Narrative   ??? No narrative on file       Review of Systems  A 12 system review of systems was negative except as noted in HPI    Objective:     Vital Signs  Vitals:    01/11/17 1732   BP: 132/69   Resp: 18   Temp: 37.2 ??C   SpO2: 99%       Physical Exam  General Appearance:  No acute distress.   Head:  Normocephalic, atraumatic. No temporal wasting.   Eyes:  Conjuctiva and lids appear normal. Pupils equal and round,   Slight scleral icterus.   Ears:  Hearing intact at conversational level. No lesions   Nose: Nares grossly normal, no drainage.   Throat: Moist mucous membranes. Good dentition. No sublingual icterus appreciated.   Neck: Supple, symmetrical, trachea midline.   Pulmonary:    Normal respiratory effort.  Lungs were clear to auscultation  bilaterally.   Cardiovascular:  Regular rate and rhythm, no murmur noted.   Abdomen:   Soft, tender to palpation in RUQ and LUQ, without masses.  No hepatosplenomegaly appreciated.   Musculoskeletal: Extremities without clubbing, cyanosis, or edema. 2+ radialn and DP pulses bilaterally. 5/5 strength BUE and BLE.   Skin: Skin color, texture, turgor normal, no rashes or lesions.   Neurologic: BUE essential tremor. Gross motor intact. No asterixis. Sensation grossly intact.   Psychiatric: Judgement and insight appropriate.  Oriented to person,          place, time, and situation         Test Results  I have reviewed the labs and studies from the last 24 hours.    Imaging:   Nm Lung Ventilation Perfusion    Result Date: 01/11/2017  EXAM: Lung Ventilation and Perfusion DATE: 01/11/2017 12:45 PM ACCESSION: [id number] UN DICTATED: 01/11/2017 12:59 PM INTERPRETATION LOCATION: [facility] CLINICAL INDICATION: 66 years old Male: eval for CTEPH-Z94.4-Liver transplant recipient (CMS-HCC)  RADIOPHARMACEUTICAL: Ventilation: Xe-133 gas, 10.5 mCi, inhalation via xenon delivery system Perfusion: Tc-89m MAA, 4 mCi, IV  TECHNIQUE: Following inhalation of Xe-133, dynamic single breath-hold, equilibrium, and washout images were acquired in Anterior and Posterior projections. For the perfusion portion of the exam, Anterior/Posterior, Oblique and Lateral views of the chest were obtained following the intravenous injection Tc-4m MAA. COMPARISON: Chest x-ray dated 11/20/2016  FINDINGS: Ventilation: Xenon gas evenly fills the lungs. Perfusion: Radiotracer uptake is evenly distributed throughout the lungs.       Normal ventilation and perfusion scan.

## 2017-01-12 NOTE — Unmapped (Addendum)
Ermalinda Barrios, NP reviewed pt's STAT labs and mentioned patient needs to be admitted. Tried calling patient unsuccessfully. Called outpatient pharmacy and they had already left.   Talked with [facility] who confirmed no beds and patient would have to go to the ED.   Amil Amen went to see if patient was in lobby and found patient and then wife called back. Discussed with wife patient needed to be admitted and would need to go to ED since there are no beds available.   Talked with Amil Amen and pt's wife returning to patient as she was almost to her car in parking deck; aware patient to go to ED since there are no beds.     Talked with Dr. Matilde Haymaker and then Dr. Nicholos Johns about patient going to ED after STAT labs returned with elevated LFTs, temp 99 with tylenol, and vomited for past 24 hours though stopped currently; mentioned flu swab was negative. Aware Julia's note is in system. Mentioned to Dr. Imogene Burn that patient is in lobby and will be walking to ED shortly.    Talked with ED staff (charge nurse unavailable) about patient coming to ED in a few minutes from pharmacy/lobby and is txp patient from 09/15/16; mentioned to page the [facility] team who is aware patient coming to the ED. Gave pager.

## 2017-01-12 NOTE — Unmapped (Signed)
Confidential Psychological Therapy Session  [facility]    Patient Name: Jeffrey Jeffrey Ward  Medical Record Number: [id number]  Date of Service: January 12, 2017  Psychology Fellow: Mora Bellman, PhD  Attending Psychologist: Venia Carbon, PsyD  Time Spent: 30 min of face-to-face counseling  CPT Procedure Code: 91478 (30 min psychotherapy with patient and/or family)  Therapy Type: Behavior Modifying/Cognitive Behavioral Therapy (CBT)  Purpose of Treatment: reduce depression symptoms, reduce anxiety symptoms, improve quality of life, improve coping, adjustment to chronic medical illness    Referral/Relevant History:  Mr.  Jeffrey Ward is a pleasant 66 y.o.  male who is s/p liver transplantation 09/15/16. He was re-admitted after his clinic visit yesterday due to several days of nausea, vomiting, and poor PO intake. Transplant psychology was consulted by the transplant team due to concerns about wife's caregiver burden, as she appeared overwhelmed and stressed in clinic yesterday.     Review of Symptoms/ROS: Deferred    Subjective: I met with Jeffrey Jeffrey Ward for 30 minutes today in Jeffrey Jeffrey Ward [facility]West room to provide emotional support and assistance with caregiver burnout. She readily admitted to feeling extremely overwhelmed in recent weeks caring for Jeffrey Jeffrey Ward, particularly in terms of medication management as many of his medications had to be crushed up (or administered in liquid form) via feeding tube. She said that Jeffrey Jeffrey Ward is not very knowledgeable about his medication regimen, and while she does not mind taking the lead, she feels stressed knowing that her husband's health largely depends on her. In her efforts to be meticulous (spending hours every day on medication management/administration), she becomes anxious and overwhelmed. Jeffrey Jeffrey Ward was very encouraged to learn today of the team's decision to switch most of her husband's medications to pill form; she believes this will reduce her stress considerably. Jeffrey Jeffrey Ward reported that since transplant, she has not left her husband's side for more than a few minutes. She will not leave him in the home by himself because she is scared that he will fall (as he did a few weeks ago when she was in another room). However, she said that Jeffrey Jeffrey Ward's post-transplant medical team has advised her that she does not need to be present 24/7, and that she needs to take care of herself as well. Jeffrey Jeffrey Ward reflected that she forgot several days of her medication (Plavix) last week, and this made her realize that she has been neglecting her own physical and emotional needs. We had a lengthy discussion today on the importance of caregiver self-care. She set several goals for herself for the next few weeks, including going to church, getting lunch with her sister, and getting her hair cut. Jeffrey Jeffrey Ward was awake for part of this conversation, and noted he has been encouraging her to get out more; he said he will be completely fine sitting in his chair and watching TV while she is gone.     Objective: Jeffrey Jeffrey Ward was lying in bed resting. He was asleep for the majority of our meeting, though engaged appropriately with this Clinical research associate and his wife when awake. Majority of today's session was spent with Jeffrey Jeffrey Ward; she was receptive to our conversation and eager to talk.     Mental Status Exam:  Jeffrey Jeffrey Ward asleep for majority of meeting; spoke primarily with wife.    Assessment:  I met with Jeffrey Jeffrey Ward wife to provide emotional support and assistance with coping with caregiver burnout/anxiety. She readily admitted that managing her husband's medical care, including an extensive daily routine of learning  to administer his medications via feeding tube, has been overwhelming and stressful. She acknowledged that in an effort to be meticulous about his medical care, she has been neglecting her own physical and emotional needs. Jeffrey Jeffrey Ward was amenable to a discussion on self-care and expressed a commitment to doing more activities for herself (e.g., going to church or lunch with her sister). She was reassured to learn from Jeffrey Jeffrey Ward medical providers that she does not need to be with him 24/7. In addition, she believes that transitioning most of his medications to pill form will be much easier on her.      Psychometric Testing: None administered        Plan:  This Clinical research associate will continue to follow Jeffrey Jeffrey Ward (and his wife) throughout this hospitalization for assistance with coping.    Mr.  Jeffrey Ward was given this writer's business card with confidential voice mail number and instructed to call 911 for emergencies.

## 2017-01-12 NOTE — Unmapped (Signed)
Multiple episodes of vomiting last night. Poor PO intake. Pt states that he also feels slightly short of breath. Liver transplant 4 months ago s/p liver CA. Hx diabetes.

## 2017-01-12 NOTE — Unmapped (Signed)
Care Management  Initial Transition Planning Assessment      Per H&P, Jeffrey Ward is a 43M with IDDM, GERD, prior perioperative a-fib with RVR and cirrhosis secondary to chronic hepatitis C and HCC who underwent liver transplantation on 09/15/16. He was readmitted on 10/31 with vomiting and increased LFTs.  ??  Per CAPP rounds, pt is doing better but pending GI and CDIFF panel. May DC by tomorrow depending on findings. Pt is followed by transplant SW.    Triggers for social work consultation identified on admission.  Social worker consulted for full psychosocial and discharge needs assessment.  CM will continue to follow for avoidable delays and opportunities for progression of care.                  General  Care Manager assessed the patient by : Medical record review, Discussion with Clinical Care team             Financial Information:          Need for financial assistance?: Other (Comment) (defer to SW)       Discharge Needs Assessment:    Concerns to be Addressed: other (see comments) (defer to SW)    Clinical Risk Factors: > 65, Multiple Diagnoses (Chronic), Readmission < 30 Days, Other (Comment)          Discharge Plan:         Expected Discharge Date: 01/13/17          Initial Assessment complete?: Yes      Lauris Chroman, LCSW; Pager [phone].   For CM assistance during the weekend, please page [phone].  January 12, 2017 3:54 PM

## 2017-01-12 NOTE — Unmapped (Signed)
[facility]  Emergency Department Provider Note        ED Clinical Impression     Final diagnoses:   None       Initial Impression, ED Course, Assessment and Plan     Impression: Jeffrey Ward is a 66 y.o. 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 Patient had a complicated transplant hospitalization, in short -  RHF 2/2 severe pulmonary hypertension, AKI CRRT, bradycardia requiring pacemaker placement, A. fib/tachycardia arrhythmia converted with amiodarone, currently on digoxin and warfarin. For the past 24 hours, patient had multiple episode of vomiting, nonbloody, nonbilious, generalized weakness. Denies any diarrhea, denies any blood in stool or vomit. Subjective fever with MAXIMUM TEMPERATURE of 99.7. Denies any abdominal pain. Patient also complains of mild shortness of breath, coughing for the past few days.    AST 144 from 54, ALT 261 from 89, ALP 665 from 480, GGT 812 from 170, WBC 7.6 from 1.7. Concerns for acute liver failure caused by acute rejection.    On exam, patient is tired looking, mildly jaundiced, in no distress.    Abdomen???   surgical scar noted, soft, nondistended, nontender.    Chest???   nontender observation, lungs clear, heart sounds normal. Pulses palpable in all 4 extremities.    No lower extreme edema bilaterally.     Given patient's newly elevated LFT, consider acute liver failure after transplant, suspected rejection. We will check labs.     7:27 PM -  I paged Dr. Nicholos Johns for admitting team clarify.    8:52 PM -  Surgery admitted pt. Pt is currently stable.    9:00 PM -  I signed out patient care to night shift resident.                 Additional Medical Decision Making     I have reviewed the vital signs and the nursing notes. Labs and radiology results that were available during my care of the patient were independently reviewed by me and considered in my medical decision making.         Portions of this record have been created using Scientist, clinical (histocompatibility and immunogenetics). Dictation errors have been sought, but may not have been identified and corrected.  ____________________________________________       History     Chief Complaint  Emesis      HPI   Jeffrey Ward is a 66 y.o. 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 Patient had a complicated transplant hospitalization, in short -  RHF 2/2 severe pulmonary hypertension, AKI CRRT, bradycardia requiring pacemaker placement, A. fib/tachycardia arrhythmia converted with amiodarone, currently on digoxin and warfarin. For the past 24 hours, patient had multiple episode of vomiting, nonbloody, nonbilious, generalized weakness. Denies any diarrhea, denies any blood in stool or vomit. Subjective fever with MAXIMUM TEMPERATURE of 99.7. Denies any abdominal pain. Patient also complains of mild shortness of breath, coughing for the past few days.        Past Medical History:   Diagnosis Date   ??? Cancer (CMS-HCC)    ??? Cirrhosis (CMS-HCC)    ??? Diabetes (  CMS-HCC)    ??? Hepatitis C 07/17/2012   ??? Liver disease    ??? Low back pain 07/17/2012   ??? Varices, esophageal (CMS-HCC)        Patient Active Problem List   Diagnosis   ??? Hepatitis C   ??? Low back pain   ??? Hematuria   ??? Calculus of ureter   ??? Anemia   ??? Benign prostatic hyperplasia   ??? Chronic pain disorder   ??? Dysrhythmia   ??? History of gastroesophageal reflux (GERD)   ??? History of hepatitis C virus infection   ??? History of substance abuse   ??? Hyperglycemia, unspecified   ??? Intermittent vertigo   ??? Microscopic hematuria   ??? Postlaminectomy syndrome, lumbar region   ??? Thrombocytopenia (CMS-HCC)   ??? Vitamin D deficiency   ??? Atrial fibrillation with RVR (CMS-HCC)   ??? Secondary esophageal varices (CMS-HCC)   ??? Right ventricular dilation   ??? End-stage liver disease (CMS-HCC)   ??? HCC (hepatocellular carcinoma) (CMS-HCC)   ??? Paroxysmal atrial fibrillation (CMS-HCC)   ??? Hepatic encephalopathy (CMS-HCC)   ??? S/P liver transplant (CMS-HCC)   ??? Pulmonary hypertension, moderate to severe (CMS-HCC)   ??? Acute right heart failure (CMS-HCC)   ??? Tracheostomy dependent (CMS-HCC)   ??? Mixed level of activity delirium due to multiple etiologies, resolved   ??? Insomnia   ??? Peripheral neuropathy       Past Surgical History:   Procedure Laterality Date   ??? ANKLE SURGERY     ??? BACK SURGERY     ??? BACK SURGERY     ??? CHG US GUIDE, TISSUE ABLATION N/A 01/22/2016    Procedure: ULTRASOUND GUIDANCE FOR, AND MONITORING OF, PARENCHYMAL TISSUE ABLATION;  Surgeon: Particia Nearing, MD;  Location: MAIN OR [facility];  Service: Transplant   ??? IR EMBOLIZATION ORGAN ISCHEMIA, TUMORS, INFAR  06/16/2016    IR EMBOLIZATION ORGAN ISCHEMIA, TUMORS, INFAR 06/16/2016 Ammie Dalton, MD IMG VIR [facility]   ??? PR COLON CA SCRN NOT HI RSK IND N/A 02/27/2015    Procedure: COLOREC CNCR SCR;COLNSCPY NO;  Surgeon: Vonda Antigua, MD;  Location: [facility];  Service: Gastroenterology   ??? PR ENDOSCOPIC ULTRASOUND EXAM N/A 02/27/2015    Procedure: UGI ENDO; W/ENDO ULTRASOUND EXAM INCLUDES ESOPHAGUS, STOMACH, &/OR DUODENUM/JEJUNUM;  Surgeon: Vonda Antigua, MD;  Location: [facility]URES [facility];  Service: Gastroenterology   ??? PR ERCP STENT PLACEMENT BILIARY/PANCREATIC DUCT N/A 11/29/2016    Procedure: ENDOSCOPIC RETROGRADE CHOLANGIOPANCREATOGRAPHY (ERCP); WITH PLACEMENT OF ENDOSCOPIC STENT INTO BILIARY OR PANCREATIC DUCT;  Surgeon: Chriss Driver, MD;  Location: GI PROCEDURES [facility]IAL [facility];  Service: Gastroenterology   ??? PR ERCP,W/REMOVAL STONE,BIL/PANCR DUCTS N/A 11/29/2016    Procedure: ERCP; W/ENDOSCOPIC RETROGRADE REMOVAL OF CALCULUS/CALCULI FROM BILIARY &/OR PANCREATIC DUCTS;  Surgeon: Chriss Driver, MD;  Location: GI [facility]URES [facility];  Service: Gastroenterology   ??? PR INSER HEART TEMP PACER ONE CHMBR N/A 10/02/2016    Procedure: Tempoarary Pacemaker Insertion;  Surgeon: Meredith Leeds, MD;  Location: [facility];  Service: Cardiology   ??? PR LAP,DIAGNOSTIC ABDOMEN N/A 01/22/2016    Procedure: Laparoscopy, Abdomen, Peritoneum, & Omentum, Diagnostic, W/Wo Collection Specimen(S) By Brushing Or Washing;  Surgeon: Particia Nearing, MD;  Location: MAIN OR [facility];  Service: Transplant   ??? PR PLACE PERCUT GASTROSTOMY TUBE N/A 11/17/2016    Procedure: UGI ENDO; W/DIRECTED PLCMT PERQ GASTROSTOMY TUBE;  Surgeon: Cletis Athens, MD;  Location: GI PROCEDURES [facility];  Service: Gastroenterology   ??? PR TRACHEOSTOMY, PLANNED N/A 09/29/2016    Procedure: TRACHEOSTOMY PLANNED (SEPART PROC);  Surgeon: Katherina Mires, MD;  Location: MAIN OR [facility]  Service: Trauma   ??? PR TRANSCATH INSERT OR REPLACE LEADLESS PM VENTR N/A 10/11/2016    Procedure: Pacemaker Implant/Replace Leadless;  Surgeon: Meredith Leeds, MD;  Location: [facility];  Service: Cardiology   ??? PR TRANSPLANT LIVER,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    Procedure: Liver Allotransplantation; Orthotopic, Partial Or Whole, From Cadaver Or Living Donor, Any Age;  Surgeon: Doyce Loose, MD;  Location: MAIN OR [facility];  Service: Transplant   ??? PR TRANSPLANT,PREP DONOR LIVER, WHOLE N/A 09/15/2016    Procedure: Rogelia Boga Std Prep Cad Donor Whole Liver Gft Prior Tnsplnt,Inc Chole,Diss/Rem Surr Tissu Wo Triseg/Lobe Splt;  Surgeon: Doyce Loose, MD;  Location: MAIN OR [facility];  Service: Transplant   ??? PR UPPER GI ENDOSCOPY,BIOPSY N/A 07/17/2012    Procedure: UGI ENDOSCOPY; WITH BIOPSY, SINGLE OR MULTIPLE;  Surgeon: Alba Destine, MD;  Location: GI PROCEDURES [facility];  Service: Gastroenterology   ??? PR UPPER GI ENDOSCOPY,DIAGNOSIS N/A 02/04/2014    Procedure: UGI ENDO, INCLUDE ESOPHAGUS, STOMACH, & DUODENUM &/OR JEJUNUM; DX W/WO COLLECTION SPECIMN, BY BRUSH OR WASH;  Surgeon: Wilburt Finlay, MD;  Location: [facility]URES [facility];  Service: Gastroenterology   ??? PR UPPER GI ENDOSCOPY,LIGAT VARIX N/A 11/05/2013    Procedure: UGI ENDO; W/BAND LIG ESOPH &/OR GASTRIC VARICES;  Surgeon: Wilburt Finlay, MD;  Location: [facility];  Service: Gastroenterology       No current facility-administered medications for this encounter.     Current Outpatient Prescriptions:   ???  acetaminophen (TYLENOL) 160 mg/5 mL solution, Take 10.2 mL (325 mg total) by mouth every four (4) hours as needed for pain., Disp: 300 mL, Rfl: 0  ???  amiodarone (PACERONE) 200 MG tablet, 1 tablet (200 mg total) by G-tube route daily., Disp: 30 tablet, Rfl: 11  ???  blood sugar diagnostic (FREESTYLE TEST) Strp, by Other route Four (4) times a day (before meals and nightly)., Disp: 100 each, Rfl: 11  ???  blood-glucose meter (GLUCOSE MONITORING KIT) kit, Use as instructed, Disp: 1 each, Rfl: 0  ???  cholecalciferol, vitamin D3, 2,000 unit cap, 1 capsule (2,000 Units total) by PEG Tube route daily., Disp: 30 capsule, Rfl: 11  ???  digoxin (LANOXIN) 125 mcg tablet, 0.5 tablets (62.5 mcg total) by G-tube route daily., Disp: 15 tablet, Rfl: 11  ???  insulin ASPART (NOVOLOG FLEXPEN U-100 INSULIN) 100 unit/mL injection pen, Inject 0.04 mL (4 Units total) under the skin Three (3) times a day before meals., Disp: 4 mL, Rfl: 11  ???  insulin ASPART (NOVOLOG FLEXPEN U-100 INSULIN) 100 unit/mL injection pen, Inject 0-0.12 mL (0-12 Units total) under the skin Four (4) times a day (before meals and nightly). Sliding scale., Disp: 14.4 mL, Rfl: 0  ???  insulin glargine (LANTUS) 100 unit/mL (3 mL) injection pen, Inject 0.12 mL (12 Units total) under the skin nightly., Disp: 4 mL, Rfl: 11  ???  lancets Misc, 1 each by Miscellaneous route Four (4) times a day (before meals and nightly)., Disp: 100 each, Rfl: 11  ???  magnesium oxide (MAG-OX) 400 mg (241.3 mg magnesium) tablet, 1 tablet (400 mg total) by G-tube route Three (3) times a day., Disp: 90 tablet, Rfl: 11  ???  melatonin 3 mg Tab, 1-2 tablets (3-6 mg total) by PEG Tube route nightly., Disp: 60 tablet,  Rfl: 0  ???  mycophenolate (CELLCEPT) 200 mg/mL suspension, 1.3 mL (260 mg total) by G-tube route Two (2) times a day., Disp: 175 mL, Rfl: 12  ???  mycophenolate (CELLCEPT) 250 mg capsule, Take 1 capsule (250 mg total) by mouth Two (2) times a day. (Patient not taking: Reported on 01/10/2017), Disp: 60 capsule, Rfl: 11  ???  NEORAL 100 mg capsule, Take 2 capsules (200 mg total) by mouth Two (2) times a day., Disp: 120 capsule, Rfl: 11  ???  omeprazole (PRILOSEC) 20 MG capsule, Take 1 capsule (20 mg total) by mouth daily., Disp: 30 capsule, Rfl: 1  ???  pen needle, diabetic (ULTICARE PEN NEEDLE) 32 gauge x 5/32 Ndle, 1 each by Miscellaneous route Four (4) times a day (before meals and nightly)., Disp: 90 each, Rfl: 3  ???  SILACE 50 mg/5 mL liquid, TAKE 10 MLS BY G-TUBE ROUTE TWICE DAILY AS NEEDED, Disp: 473 mL, Rfl: PRN  ???  sildenafil, antihypertensive, (REVATIO) 20 mg tablet, Take 2 tablets (40 mg total) by mouth Three (3) times a day., Disp: 180 tablet, Rfl: 11  ???  sulfamethoxazole-trimethoprim (BACTRIM,SEPTRA) 400-80 mg per tablet, Take 1 tablet (80 mg of trimethoprim total) by mouth 3 (three) times a week., Disp: 27 tablet, Rfl: 0  ???  thiamine (B-1) 100 MG tablet, 1 tablet (100 mg total) by G-tube route daily., Disp: 30 tablet, Rfl: 11  ???  warfarin (COUMADIN) 3 MG tablet, 1 tablet (3 mg total) by G-tube route daily with evening meal., Disp: 30 tablet, Rfl: 11    Allergies  Patient has no known allergies.    Family History   Problem Relation Age of Onset   ??? Hypertension Mother    ??? Cirrhosis Neg Hx    ??? Liver cancer Neg Hx        Social History  Social History   Substance Use Topics   ??? Smoking status: Never Smoker   ??? Smokeless tobacco: Never Used      Comment: Smoked in high school for about 5 years.    ??? Alcohol use No       Review of Systems     Constitutional: Negative for fever.  Eyes: Negative for visual changes.  ENT: Negative for sore throat.  Cardiovascular: Negative for chest pain.  Respiratory: + for shortness of breath.  Gastrointestinal: Negative for abdominal pain,+ vomiting   Genitourinary: Negative for dysuria.   Musculoskeletal: Negative for back pain.  Skin: Negative for rash.  Neurological: Negative for headaches, focal weakness or numbness.      Physical Exam     ED Triage Vitals [01/11/17 1732]   Enc Vitals Group      BP 132/69      Pulse       SpO2 Pulse 80      Resp 18      Temp 37.2 ??C (99 ??F)      Temp Source Oral      SpO2 99 %      Weight       Height       Head Circumference       Peak Flow       Pain Score       Pain Loc       Pain Edu?       Excl. in GC?          Constitutional: Alert and oriented. patient is tired looking, mildly jaundiced, in no distress.  Eyes: mild jaundiced  conjunctiva b/l.  ENT       Head: Normocephalic and atraumatic.       Nose: No congestion.       Mouth/Throat: Mucous membranes are moist.       Neck: No stridor.  Hematological/Lymphatic/Immunilogical: No cervical lymphadenopathy.  Cardiovascular: Normal rate, regular rhythm. Normal and symmetric distal pulses are present in all extremities.  Respiratory: Normal respiratory effort. Breath sounds are normal.  Gastrointestinal:  surgical scar noted, soft, nondistended, nontender.  Musculoskeletal: Normal range of motion in all extremities.       Right lower leg: No tenderness or edema.       Left lower leg: No tenderness or edema.  Neurologic: Normal speech and language. No gross focal neurologic deficits are appreciated.  Skin: Skin is warm, dry and intact. No rash noted.  Psychiatric: Mood and affect are normal. Speech and behavior are normal.      EKG      Adult ECG Report     Name: Cynthia Stainback   Age: 66 y.o.   Gender: male       Rate: 81   Rhythm: normal sinus rhythm   Narrative Interpretation: Normal sinus rhythm, normal intervals, no ST segment elevations or depressions, no abnormal T-wave inversions, normal morphology.      Radiology     ED POCUS RUSH Protocol Emergency    (Results Pending)       ----------------------------------------------  Electronically Signed By:  ??  Raynald Blend, MD, PGY1  [facility] Emergency Medicine  ----------------------------------------------     Raynald Blend, MD  Resident  01/11/17 [phone]

## 2017-01-13 LAB — CMV VIRAL LD: Lab: NOT DETECTED

## 2017-01-13 LAB — HEPATIC FUNCTION PANEL
ALBUMIN: 2.7 g/dL — ABNORMAL LOW (ref 3.5–5.0)
ALT (SGPT): 115 U/L — ABNORMAL HIGH (ref 19–72)
BILIRUBIN TOTAL: 1.1 mg/dL (ref 0.0–1.2)
PROTEIN TOTAL: 5 g/dL — ABNORMAL LOW (ref 6.5–8.3)

## 2017-01-13 LAB — CMV DNA, QUANTITATIVE, PCR

## 2017-01-13 LAB — BASIC METABOLIC PANEL
ANION GAP: 6 mmol/L — ABNORMAL LOW (ref 9–15)
BLOOD UREA NITROGEN: 20 mg/dL (ref 7–21)
CALCIUM: 8.3 mg/dL — ABNORMAL LOW (ref 8.5–10.2)
CHLORIDE: 101 mmol/L (ref 98–107)
CO2: 24 mmol/L (ref 22.0–30.0)
CREATININE: 1.19 mg/dL (ref 0.70–1.30)
EGFR MDRD NON AF AMER: >=60 mL/min/{1.73_m2} (ref >=60)
GLUCOSE RANDOM: 190 mg/dL — ABNORMAL HIGH (ref 65–179)
POTASSIUM: 3.5 mmol/L (ref 3.5–5.0)
SODIUM: 131 mmol/L — ABNORMAL LOW (ref 135–145)

## 2017-01-13 LAB — POTASSIUM: Potassium:SCnc:Pt:Ser/Plas:Qn:: 3.5

## 2017-01-13 LAB — CBC
HEMATOCRIT: 29.5 % — ABNORMAL LOW (ref 41.0–53.0)
HEMOGLOBIN: 9.6 g/dL — ABNORMAL LOW (ref 13.5–17.5)
MEAN CORPUSCULAR HEMOGLOBIN CONC: 32.4 g/dL (ref 31.0–37.0)
MEAN CORPUSCULAR HEMOGLOBIN: 30.7 pg (ref 26.0–34.0)
MEAN PLATELET VOLUME: 8.7 fL (ref 7.0–10.0)
PLATELET COUNT: 127 10*9/L — ABNORMAL LOW (ref 150–440)
RED BLOOD CELL COUNT: 3.11 10*12/L — ABNORMAL LOW (ref 4.50–5.90)
RED CELL DISTRIBUTION WIDTH: 18.3 % — ABNORMAL HIGH (ref 12.0–15.0)

## 2017-01-13 LAB — MEAN CORPUSCULAR VOLUME: Lab: 94.8

## 2017-01-13 LAB — ALBUMIN: Albumin:MCnc:Pt:Ser/Plas:Qn:: 2.7 — ABNORMAL LOW

## 2017-01-13 LAB — MPA GLUCURONIDE: Mycophenolate glucuronide:MCnc:Pt:Ser/Plas:Qn:: 32 — ABNORMAL LOW

## 2017-01-13 LAB — MYCOPHENOLATE: MYCOPHENOLATE: <0.5 ug/mL — ABNORMAL LOW

## 2017-01-13 NOTE — Unmapped (Signed)
PHYSICAL THERAPY  Evaluation (01/13/17 1128)     Patient Name:  Jeffrey Ward       Medical Record Number: [id number]   Date of Birth: 1951/01/14  Sex: Male            Treatment Diagnosis: decreased mobility    ASSESSMENT    77M with IDDM, GERD, prior perioperative a-fib with RVR and cirrhosis secondary to chronic hepatitis C and HCC who underwent liver transplantation on 09/15/16 with a very complicated post-op course. He was readmited from clinic 01/11/17 due to a few days of vomiting, anorexia, and mild diarrhea. Pt presents with decreased mobility 2/2 pain, deconditioning. During session, pt with significant fatigue and SOB after using the bathroom. Pt returned to bed. VSS. Pt encouraged to ambulate short distances throughout the day with RN assist. AMPAC score 19/24 indicates 36.99% impairment in basic functional mobility. Pt appropriate for 5x/wk low intensity post-acute PT. After review of contributing co-morbidities and personal factors, clinical presentation and exam findings, patient demonstrates moderate complexity for evaluation and development of plan of care.    Today's Interventions: AMPAC 19/24; evaluation, bed mobility, balance, ambulation, toileting, education - role of PT and POC    Activity Tolerance: Patient limited by fatigue    PLAN  Planned Frequency of Treatment:  1-2x per day for: 3-4x week      Planned Interventions: Balance activities;Diaphragmatic / Pursed-lip breathing;Education - Patient;Education - Family / caregiver;Endurance activities;Functional mobility;Gait training;Postural re-education;Self-care / Home training;Stair training;Therapeutic exercise;Therapeutic activity;Transfer training    Post-Discharge Physical Therapy Recommendations:  5x weekly;Low intensity        PT DME Recommendations: Defer to post acute     Goals:   Patient and Family Goals: none stated     Long Term Goal #1: Pt will ambulate 642ft with LRD indep within 6 weeks       SHORT GOAL #1: Pt will perform all transfers with LRD indep              Time Frame : 1 week  SHORT GOAL #2: Pt will ambulate 167ft with LRD indep              Time Frame : 2 weeks  SHORT GOAL #3: Pt will negotiate 7 steps with L rail with LRD CGA              Time Frame : 2 weeks         Prognosis:  Good  Barriers to Discharge: Endurance deficits;Functional strength deficits;Gait instability;Pain  Positive Indicators: PLOF, family support    SUBJECTIVE  Patient reports: Pt/RN agreeable to PT  Current Functional Status: Pt received semirecumbent in bed and left sitting on EOB with all needs in reach. RN aware  Services patient receives: PT;OT  Prior functional status: Nurse, mental health with use of rollator. Indep with ADLs  Equipment available at home: Rollator    Past Medical History:   Diagnosis Date   ??? Cancer (CMS-HCC)    ??? Cirrhosis (CMS-HCC)    ??? Diabetes (CMS-HCC)    ??? Hepatitis C 07/17/2012   ??? Liver disease    ??? Low back pain 07/17/2012   ??? Varices, esophageal (CMS-HCC)     Social History   Substance Use Topics   ??? Smoking status: Never Smoker   ??? Smokeless tobacco: Never Used      Comment: Smoked in high school for about 5 years.    ??? Alcohol use No      Past Surgical History:  Procedure Laterality Date   ??? ANKLE SURGERY     ??? BACK SURGERY     ??? BACK SURGERY     ??? CHG US GUIDE, TISSUE ABLATION N/A 01/22/2016    Procedure: ULTRASOUND GUIDANCE FOR, AND MONITORING OF, PARENCHYMAL TISSUE ABLATION;  Surgeon: Particia Nearing, MD;  Location: MAIN OR [facility];  Service: Transplant   ??? IR EMBOLIZATION ORGAN ISCHEMIA, TUMORS, INFAR  06/16/2016    IR EMBOLIZATION ORGAN ISCHEMIA, TUMORS, INFAR 06/16/2016 Ammie Dalton, MD IMG VIR H&V [facility]   ??? PR COLON CA SCRN NOT HI RSK IND N/A 02/27/2015    Procedure: COLOREC CNCR SCR;COLNSCPY NO;  Surgeon: Vonda Antigua, MD;  Location: GI PROCEDURES [facility];  Service: Gastroenterology   ??? PR ENDOSCOPIC ULTRASOUND EXAM N/A 02/27/2015    Procedure: UGI ENDO; W/ENDO ULTRASOUND EXAM INCLUDES ESOPHAGUS, STOMACH, &/OR DUODENUM/JEJUNUM;  Surgeon: Vonda Antigua, MD;  Location: GI PROCEDURES [facility];  Service: Gastroenterology   ??? PR ERCP STENT PLACEMENT BILIARY/PANCREATIC DUCT N/A 11/29/2016    Procedure: ENDOSCOPIC RETROGRADE CHOLANGIOPANCREATOGRAPHY (ERCP); WITH PLACEMENT OF ENDOSCOPIC STENT INTO BILIARY OR PANCREATIC DUCT;  Surgeon: Chriss Driver, MD;  Location: GI PROCEDURES MEMORIAL [facility];  Service: Gastroenterology   ??? PR ERCP,W/REMOVAL STONE,BIL/PANCR DUCTS N/A 11/29/2016    Procedure: ERCP; W/ENDOSCOPIC RETROGRADE REMOVAL OF CALCULUS/CALCULI FROM BILIARY &/OR PANCREATIC DUCTS;  Surgeon: Chriss Driver, MD;  [facility];  Service: Gastroenterology   ??? PR INSER HEART TEMP PACER ONE CHMBR N/A 10/02/2016    Procedure: Tempoarary Pacemaker Insertion;  Surgeon: Meredith Leeds, MD;  [facility]: [facility];  Service: Cardiology   ??? PR LAP,DIAGNOSTIC ABDOMEN N/A 01/22/2016    Procedure: Laparoscopy, Abdomen, Peritoneum, & Omentum, Diagnostic, W/Wo Collection Specimen(S) By Brushing Or Washing;  Surgeon: Particia Nearing, MD;  Location: MAIN OR [facility];  Service: Transplant   ??? PR PLACE PERCUT GASTROSTOMY TUBE N/A 11/17/2016    Procedure: UGI ENDO; W/DIRECTED PLCMT PERQ GASTROSTOMY TUBE;  Surgeon: Cletis Athens, MD;  Location: GI PROCEDURES [facility];  Service: Gastroenterology   ??? PR TRACHEOSTOMY, PLANNED N/A 09/29/2016    Procedure: TRACHEOSTOMY PLANNED (SEPART PROC);  Surgeon: Katherina Mires, MD;  Location: MAIN OR [facility];  Service: Trauma   ??? PR TRANSCATH INSERT OR REPLACE LEADLESS PM VENTR N/A 10/11/2016    Procedure: Pacemaker Implant/Replace Leadless;  Surgeon: Meredith Leeds, MD;  Location: [facility];  Service: Cardiology   ??? PR TRANSPLANT LIVER,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    Procedure: Liver Allotransplantation; Orthotopic, Partial Or Whole, From Cadaver Or Living Donor, Any Age;  Surgeon: Doyce Loose, MD;  Location: MAIN OR [facility];  Service: Transplant   ??? PR TRANSPLANT,PREP DONOR LIVER, WHOLE N/A 09/15/2016    Procedure: Rogelia Boga Std Prep Cad Donor Whole Liver Gft Prior Tnsplnt,Inc Chole,Diss/Rem Surr Tissu Wo Triseg/Lobe Splt;  Surgeon: Doyce Loose, MD;  Location: MAIN OR [facility];  Service: Transplant   ??? PR UPPER GI ENDOSCOPY,BIOPSY N/A 07/17/2012    Procedure: UGI ENDOSCOPY; WITH BIOPSY, SINGLE OR MULTIPLE;  Surgeon: Alba Destine, MD;  Location: GI PROC[facility]URES [facility];  Service: Gastroenterology   ??? PR UPPER GI ENDOSCOPY,DIAGNOSIS N/A 02/04/2014    Procedure: UGI ENDO, INCLUDE ESOPHAGUS, STOMACH, & DUODENUM &/OR JEJUNUM; DX W/WO COLLECTION SPECIMN, BY BRUSH OR WASH;  Surgeon: Wilburt Finlay, MD;  Location: GI PROCEDURES [facility];  Service: Gastroenterology   ??? PR UPPER GI ENDOSCOPY,LIGAT VARIX N/A 11/05/2013    Procedure: UGI ENDO; W/BAND LIG ESOPH &/OR GASTRIC  VARICES;  Surgeon: Wilburt Finlay, MD;  Location: GI PROCEDURES [facility];  Service: Gastroenterology    Family History   Problem Relation Age of Onset   ??? Hypertension Mother    ??? Cirrhosis Neg Hx    ??? Liver cancer Neg Hx         Allergies: Patient has no known allergies.                Objective Findings              Precautions: Isolation precautions (Enteric Precautions )              Weight Bearing Status: Non- applicable              Required Braces or Orthoses: Non- applicable    Communication Preference: Verbal  Pain Comments: Pt c/o back pain 6/10. RN aware.  Medical Tests / Procedures: Reviewed in Epic  Equipment / Environment: None    At Rest: VSS per Epic  With Activity: HR 88; SpO2 96% on RA  Orthostatics: Asymptomatic  Airway Clearance: mobility    Living environment: House  Lives With: Spouse;Other (grandson; wife is primary caregiver)  Home Living: One level home;Grab bars around toilet;Grab bars in shower;Walk-in shower;Garden tub;Built-in shower seat;Standard height toilet;Stairs to enter with rails  Rail placement (outside): Rail on left side     Number of Stairs: 7    Cognition: WFL; follows multistep commands consistently   Visual / Perception Status: WFL  Skin Inspection: visible skin c/d/i    UE ROM: WFL  UE Strength: WFL  LE ROM: WFL  LE Strength: WFL                       Sensation: Reports n/t in B feet (since transplant 09/2016)  Balance: dynamic standing CGA with rollator    Posture: FH     Bed Mobility: supine>sit with HOB elevated SBA  Transfers: sit<>stand with rollator SBA   Gait: Amb. 20 ft in room to bathroom with rollator CGA; slow cadence; quick fatigue   Stairs: NT      Endurance: Fair     Eval Duration(PT): 26 Min.    Medical Staff Made Aware: RN - Lana  PT G-Codes  Functional Assessment Tool Used: AMPAC  Score: 19/24  Functional Limitation: Mobility: Walking and moving around  Mobility: Walking and Moving Around Current Status [phone]): At least 20 percent but less than 40 percent impaired, limited or restricted  Mobility: Walking and Moving Around Goal Status [phone]): At least 1 percent but less than 20 percent impaired, limited or restricted  Rationale: AMPAC score 19/24 indicates 36.99% impairment in basic functional mobility  I attest that I have reviewed the above information.  Signed: Racheal Patches, PT  Filed 01/13/2017

## 2017-01-13 NOTE — Unmapped (Signed)
Problem: Patient Care Overview  Goal: Plan of Care Review  Outcome: Progressing      Comments: No nausea, vomiting or diarrhea this shift, UOP adequate, small formed BM at beginning of shift, poor PO intake.

## 2017-01-13 NOTE — Unmapped (Signed)
Problem: Patient Care Overview  Goal: Plan of Care Review  Outcome: Discharged to Home  ?? Vital signs stable   ?? Sent stool sample for GI pathogen panel . Stool formed  ?? Voiding   ?? Requests 10 mg of pain medication every 4 hours for chronic back pain, relief reported.He also reports intermittent pain in both pain that radiates up his legs, reports this started 3 weeks ago and the MD's are aware   ?? Tolerating regular diet with no complaints of N&V  ?? Ambulates without difficulty  ?? Will continue to monitor.   ?? Awaiting return of wife from cafeteria so a medication update teaching can be preformed then patient plans to dc home  ?? Provided patient with supplies for mic-key tube exchange to bring with him to the transplant clinic per Dr.Desai's instruction, so it can be exchanged in the clinic. Dr.Moskal did not exchange it at the bedside. Dr.Desai and Dr.Serano assessed the gathered supplies and agreed the supplies were appropriate and the gtube could be easily exchanged at the clinic

## 2017-01-16 ENCOUNTER — Ambulatory Visit
Admission: RE | Admit: 2017-01-16 | Discharge: 2017-01-16 | Disposition: A | Discharge status: Home / Self Care | Payer: MEDICARE

## 2017-01-16 ENCOUNTER — Ambulatory Visit
Admission: RE | Admit: 2017-01-16 | Discharge: 2017-01-16 | Disposition: A | Discharge status: Home / Self Care | Attending: Family | Admitting: Family

## 2017-01-16 ENCOUNTER — Ambulatory Visit
Admission: RE | Admit: 2017-01-16 | Discharge: 2017-01-16 | Disposition: A | Discharge status: Home / Self Care | Payer: MEDICARE | Attending: Pharmacist Clinician (PhC)/ Clinical Pharmacy Specialist | Admitting: Pharmacist Clinician (PhC)/ Clinical Pharmacy Specialist

## 2017-01-16 DIAGNOSIS — Z7982 Long term (current) use of aspirin: Secondary | ICD-10-CM

## 2017-01-16 DIAGNOSIS — Z944 Liver transplant status: Principal | ICD-10-CM

## 2017-01-16 DIAGNOSIS — D649 Anemia, unspecified: Secondary | ICD-10-CM

## 2017-01-16 DIAGNOSIS — Z794 Long term (current) use of insulin: Secondary | ICD-10-CM

## 2017-01-16 DIAGNOSIS — Z09 Encounter for follow-up examination after completed treatment for conditions other than malignant neoplasm: Secondary | ICD-10-CM

## 2017-01-16 DIAGNOSIS — E1142 Type 2 diabetes mellitus with diabetic polyneuropathy: Secondary | ICD-10-CM

## 2017-01-16 DIAGNOSIS — K219 Gastro-esophageal reflux disease without esophagitis: Secondary | ICD-10-CM

## 2017-01-16 DIAGNOSIS — Z4823 Encounter for aftercare following liver transplant: Principal | ICD-10-CM

## 2017-01-16 DIAGNOSIS — Z6821 Body mass index (BMI) 21.0-21.9, adult: Secondary | ICD-10-CM

## 2017-01-16 DIAGNOSIS — Z9181 History of falling: Secondary | ICD-10-CM

## 2017-01-16 DIAGNOSIS — E46 Unspecified protein-calorie malnutrition: Secondary | ICD-10-CM

## 2017-01-16 DIAGNOSIS — N400 Enlarged prostate without lower urinary tract symptoms: Secondary | ICD-10-CM

## 2017-01-16 DIAGNOSIS — G894 Chronic pain syndrome: Secondary | ICD-10-CM

## 2017-01-16 DIAGNOSIS — I272 Pulmonary hypertension, unspecified: Secondary | ICD-10-CM

## 2017-01-16 DIAGNOSIS — Z431 Encounter for attention to gastrostomy: Secondary | ICD-10-CM

## 2017-01-16 DIAGNOSIS — I4891 Unspecified atrial fibrillation: Secondary | ICD-10-CM

## 2017-01-16 DIAGNOSIS — Z79899 Other long term (current) drug therapy: Secondary | ICD-10-CM

## 2017-01-16 DIAGNOSIS — Z8505 Personal history of malignant neoplasm of liver: Secondary | ICD-10-CM

## 2017-01-16 DIAGNOSIS — Z8619 Personal history of other infectious and parasitic diseases: Secondary | ICD-10-CM

## 2017-01-16 DIAGNOSIS — M961 Postlaminectomy syndrome, not elsewhere classified: Secondary | ICD-10-CM

## 2017-01-16 DIAGNOSIS — Z950 Presence of cardiac pacemaker: Secondary | ICD-10-CM

## 2017-01-16 MED ORDER — MAGNESIUM OXIDE-MAGNESIUM AMINO ACID CHELATE 133 MG TABLET
Freq: Two times a day (BID) | ORAL | 0 refills | 0.00000 days | Status: CP
Start: 2017-01-16 — End: 2017-01-16

## 2017-01-16 MED ORDER — GABAPENTIN 100 MG CAPSULE: 100 mg | capsule | Freq: Three times a day (TID) | 3 refills | 0 days | Status: AC

## 2017-01-16 MED ORDER — GABAPENTIN 100 MG CAPSULE
ORAL_CAPSULE | Freq: Three times a day (TID) | ORAL | 6 refills | 0.00000 days | Status: CP
Start: 2017-01-16 — End: 2017-01-16

## 2017-01-16 MED ORDER — MAGNESIUM OXIDE-MAGNESIUM AMINO ACID CHELATE 133 MG TABLET: 133 mg | each | 0 refills | 0 days

## 2017-01-16 MED FILL — MYCOPHENOLATE MOFETIL/250MG/CAPS: MYCOPHENOLATE MOFETIL/250MG/CAPS | 30 days supply | Qty: 60 | Fill #0

## 2017-01-16 MED FILL — GABAPENTIN/100MG/CAPS: GABAPENTIN/100MG/CAPS | 32 days supply | Qty: 96 | Fill #0

## 2017-01-16 NOTE — Unmapped (Signed)
Discharge Summary    Admit date: 01/11/2017    Discharge date and time: 01/13/2017    Discharge to:  Home    Discharge Service: Surg Transplant [facility])    Discharge Attending Physician: No att. providers found    Discharge  Diagnoses: nausea, vomiting, diarrhea    Secondary Diagnosis: Active Problems:    * No active hospital problems. *      OR Procedures:       Ancillary Procedures: no procedures    Discharge Day Services: the patient was seen and evaluated by transplant surgery and deemed suitable for discharge.    Subjective   No acute events overnight. Pain Controlled. No fever or chills.    Objective   No data found.    No intake/output data recorded.    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 Post-op course was complicated by decompensation secondary to acute right heart failure,??AKI requiring CRRT (resolved), severe pulmonary hypertension responsive to sildenafil, bradycardia/ asystole requiring pacemaker placement, atrial fibrilliation/ narrow complex tachycardia requiring anti-coagulation with warfarin and amiodarone/digoxin, malnutrition/debility, dysphagia requiring G tube placement, and altered mental status/delirium improved with transition to cyclosporine and psychiatric medications. He was readmitted 10/8 for elevated LFTs with no intervention required. He presented again for admission 10/31 due to nausea/ vomiting and diarrhea with LFTs. On admission his symptoms improved, and during his admission his LFTs downtrended. He was clinically stable, tolerating a regular diet, afebrile, without nausea/ vomiting or diarrhea. GI pathogen panel was sent. G-tube was unable to be changed to a Mic-E button due to supply issues. He was discharged to home with plan for outpatient follow-up.    Condition at Discharge: Improved  Discharge Medications:      Medication List      CHANGE how you take these medications    mycophenolate 200 mg/mL suspension  Commonly known as:  CELLCEPT  1.3 mL (260 mg total) by G-tube route Two (2) times a day.  What changed:  how much to take  Another medication with the same name was removed. Continue taking this   medication, and follow the directions you see here.        CONTINUE taking these medications    acetaminophen 160 mg/5 mL solution  Commonly known as:  TYLENOL  Take 10.2 mL (325 mg total) by mouth every four (4) hours as needed for   pain.     amiodarone 200 MG tablet  Commonly known as:  PACERONE  1 tablet (200 mg total) by G-tube route daily.     blood sugar diagnostic Strp  Commonly known as:  FREESTYLE TEST  by Other route Four (4) times a day (before meals and nightly).     blood-glucose meter kit  Commonly known  as:  glucose monitoring kit  Use as instructed     cholecalciferol (vitamin D3) 2,000 unit Cap  1 capsule (2,000 Units total) by PEG Tube route daily.     digoxin 125 mcg tablet  Commonly known as:  LANOXIN  0.5 tablets (62.5 mcg total) by G-tube route daily.     docusate 50 mg/5 mL liquid  Commonly known as:  COLACE     insulin ASPART 100 unit/mL injection pen  Commonly known as:  NovoLOG Flexpen U-100 Insulin  Inject 0.04 mL (4 Units total) under the skin Three (3) times a day before   meals.     insulin glargine 100 unit/mL (3 mL) injection pen  Commonly known as:  LANTUS  Inject 0.12 mL (12 Units total) under the skin nightly.     lancets Misc  1 each by Miscellaneous route Four (4) times a day (before meals and   nightly).     magnesium oxide 400 mg (241.3 mg magnesium) tablet  Commonly known as:  MAG-OX  1 tablet (400 mg total) by G-tube route Three (3) times a day.     melatonin 5 mg Cap     NEORAL 100 MG capsule  Generic drug:  cycloSPORINE modified  Take 2 capsules (200 mg total) by mouth Two (2) times a day.     omeprazole 20 MG capsule  Commonly known as:  PriLOSEC  Take 1 capsule (20 mg total) by mouth daily.     pen needle, diabetic 32 gauge x 5/32 Ndle  Commonly known as:  ULTICARE PEN NEEDLE  1 each by Miscellaneous route Four (4) times a day (before meals and   nightly).     polyethylene glycol 17 gram/dose powder  Commonly known as:  GLYCOLAX     sildenafil (antihypertensive) 20 mg tablet  Commonly known as:  REVATIO  Take 2 tablets (40 mg total) by mouth Three (3) times a day.     sulfamethoxazole-trimethoprim 400-80 mg per tablet  Commonly known as:  BACTRIM,SEPTRA  Take 1 tablet (80 mg of trimethoprim total) by mouth 3 (three) times a   week.     traMADol 50 mg tablet  Commonly known as:  ULTRAM     warfarin 3 MG tablet  Commonly known as:  COUMADIN  1 tablet (3 mg total) by G-tube route daily with evening meal.          Pending Test Results:     Discharge Instructions:  Activity:     Diet:    Other Instructions:    Labs and Other Follow-ups after Discharge:  Follow Up instructions and Outpatient Referrals     Discharge instructions       Activity: As tolerated    Diet: Regular diet    Contact your transplant coordinator or the Transplant Surgery Office [phone]) during business hours or page the transplant coordinator on call [phone]) after business hours for:    - fever >100.5 degrees F by mouth, any fever with shaking chills, or other signs or symptoms of infection   - uncontrolled nausea, vomiting, or diarrhea; inability to have a bowel movement for > 3 days.   - any problem that prevents taking medications as scheduled.   - pain uncontrolled with prescribed medication or new pain or tenderness at the surgical site   - sudden weight gain or increase in blood pressure (greater than 140/85)   - shortness of breath, chest pain / discomfort   - new or increasing jaundice   -  urinary symptoms including pain / difficulty / burning or tea-colored urine   - any other new or concerning symptoms   - questions regarding your medications or continuing care      Labs and Other Follow-ups after Discharge:   Resume post-transplant lab schedule    Coordinator:  Mariea Stable phone [phone] fax [phone]               Future Appointments:  Appointments which have been scheduled for you    Jan 16, 2017  1:00 PM EST  (Arrive by 12:30 PM)  LAB ONLY with SURTRA LAB ONLY  [facility] TRANSPLANT SURGERY [facility] REGION) [address]  [place] [address]  [phone]   Jan 16, 2017  2:20 PM EST  (Arrive by 1:50 PM)  RETURN PHARMD with Ruth-Ann Maryln Manuel, CPP  [facility] TRANSPLANT SURGERY [place] [facility] REGION) [address]  [place] [address]  [phone]   Jan 16, 2017  3:00 PM EST  (Arrive by 2:30 PM)  RETURN  30 with Carolan Shiver, FNP  [facility] TRANSPLANT SURGERY [place] [facility] REGION) [address]  [place] [address]  [phone]   Jan 17, 2017 To Be Determined  PT - HOME VISIT with Duanne Moron, PT  [facility] AND HOSPICE [facility]) [address]  [place] [address]  [phone]   Jan 19, 2017 To Be Determined  PT - HOME VISIT with Duanne Moron, PT  [facility] AND HOSPICE [facility]) [address]  [place] [address]  [phone]   Jan 19, 2017  2:00 AM EST  SN - HOME VISIT with Peterson Lombard, LPN  [facility] AND HOSPICE [facility]) [address]  [place] [address]  [phone]   Jan 20, 2017 10:10 AM EST  (Arrive by 9:55 AM)  NEW  ENDOCRINE with Jimmie Molly, MD  [facility] DIABETES AND ENDOCRINOLOGY [place] [facility] REGION) [address]  [place] [address]  [phone]   Jan 23, 2017 12:00 AM EST  AT HOME PACEMAKER CHECK with [facility] EP REMOTE MONITORING  [facility] EP REMOTE MONITORING [place] [facility] REGION) [address]  [address]nd [address] [place] [address]  [phone]   Jan 23, 2017 To Be Determined  SN - HOME VISIT with Milderd Meager, RN  [facility] AND HOSPICE [facility]) [address]  [place] [address]  [phone]   Jan 30, 2017 To Be Determined  SN - HOME VISIT with Milderd Meager, RN  [facility] AND HOSPICE [facility]) [address]  [place] [address]  [phone]   Jan 30, 2017 10:30 AM EST  NEW  VOICE with Rupali Harvie Bridge, MD  [place] [facility] [address] [place] [facility] REGION) [address]  [place] [address]  [phone]   Apr 07, 2017 10:55 AM EST  (Arrive by 10:40 AM)  RETURN EP with Webb Silversmith, MD  [facility] HEART VASCULAR CTR CARDIO [place] [facility] [facility] [place]ION) [address]  [place]  [address] [place] [address]  [phone]   Apr 11, 2017 12:40 PM EST  (Arrive by 12:25 PM)  RETURN  GENERAL with Idelle Crouch, MD  [facility] PHYSICAL MEDICINE [facility] [address] [place] [facility]) [address]  [place] [address]  [phone]   Apr 21, 2017  2:15 PM EST  (Arrive by 2:00 PM)  ECHOCARDIOGRAM W COLORFLOW SPECTRAL DOPPLER with MM ECHO RM 3  IMG ECHO [place] [facility]) [address]  [place] [address]  [phone]   Apr 21, 2017  3:00 PM EST  (Arrive by 2:45 PM)  RETURN  PULM HYPERTENSION with Jettie Booze, MD  [facility] PULMONARY SPECIALTY CL [place] [facility] REGION) [address]  [place] [address]  [phone]   Apr 24, 2017 12:00 AM EST AT HOME PACEMAKER CHECK with [facility] EP REMOTE MONITORING  [facility] EP REMOTE MONITORING [facility] REGION) [address]  2[address] [place] [address]  [phone]   Jul 24, 2017 12:00 AM EDT  AT HOME PACEMAKER CHECK with [facility] EP REMOTE MONITORING  [facility] EP REMOTE MONITORING  [facility] REGION) [address]  2nd Floor [place] [address]  [phone]

## 2017-01-16 NOTE — Unmapped (Addendum)
CARDIAC ELECTROPHYSIOLOGY CLINIC FOLLOWUP NOTE:  ( this patient's outpatient electrophysiologist is Dr Kathi Ludwig. Patient was accidentally scheduled to see me in clinic).       Assessment and Plan:      Assessment/Plan:     Jeffrey Ward is a 66 y.o. male with a history of 70M with IDDM, GERD, prior perioperative a-fib with RVR and 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 Post-op course (~3 month hospitalization) complicated by decompensation secondary to acute right heart failure,??AKI requiring CRRT (resolved), severe pulmonary hypertension responsive to sildenafil, bradycardia/ asystole requiring leadless pacemaker (MICRA)  implantation, atrial fibrilliation requiring anti-coagulation with warfarin and amiodarone/ digoxin, malnutrition/ debility, and altered mental status/ delirium improved with transition to cyclosporine and psychiatric medications. He was discharged to [facility] 9/13 and discharged to home 12/10/16.  He was readmitted 12/19/16 for increased LFT's and poor PO intake. He was made NPO and started on IVF. Transplant liver ultrasound showed patent CBD and non-obstructing debris within the bile duct. His LFT's improved without intervention. He also has a PEG tube. He presents today for followup of MICRA pacemaker and atrial fibrillation. Of note, MICRA pacemaker was implanted by Dr Kathi Ludwig.   Patient denies any chest pain or palpitations since discharge. He says he occasionally has some dizziness when he gets up out of bed. He uses a walker without any issues. Denies syncope.  He is getting his meds via PEG tube. His wife accompanies him to clinic today. 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 He denies any bleeding on warfarin. He is in sinus rhythm today. There are no events/episodes on his pacemaker.     1. Bradycardia/asystole s/p MICRA pacemaker by Dr Kathi Ludwig  Normal device function and normal lead parameters. No episodes.     2. Postop atrial fibrillation  No events/episodes of afib on pacemaker. Patient denies any symptoms and is in sinus rhythm today. Given increase in LFTs recently and post-liver transplant, will discontinue amiodarone at this time.  Discussed with Dr Kathi Ludwig, who had initiated amiodarone while patient was in the hospital, and Dr Kathi Ludwig agreed with discontinuation of amiodarone. Instructed patient and his wife to discontinue amiodarone and to notify us if Jeffrey Ward any palpitations.     RTC in 3 months with Dr Kathi Ludwig.      Gus Rankin MD MAS  Assistant Professor   Cardiac Electrophysiology      Subjective:          Jeffrey Ward is a 66 y.o. male with a history of 45M with IDDM, GERD, prior perioperative a-fib with RVR and 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 Post-op course (~3 month hospitalization) complicated by decompensation secondary to acute right heart failure,??AKI requiring CRRT (resolved), severe pulmonary hypertension responsive to sildenafil, bradycardia/ asystole requiring leadless pacemaker (MICRA)  implantation, atrial fibrilliation requiring anti-coagulation with warfarin and amiodarone/ digoxin, malnutrition/ debility, and altered mental status/ delirium improved with transition to cyclosporine and psychiatric medications. He was discharged to [facility] 9/13 and discharged to home 12/10/16.   ??  He was readmitted 12/19/16 for increased LFT's and poor PO intake. He was made NPO and started on IVF. Transplant liver ultrasound showed patent CBD and non-obstructing debris within the bile duct. His LFT's improved without intervention. He also has a PEG tube. He presents today for followup of MICRA pacemaker and atrial fibrillation. Of note,  MICRA pacemaker was implanted by Dr Kathi Ludwig.   Patient denies any chest pain or palpitations since discharge. He says he occasionally has some dizziness when he gets up out of bed. He uses a walker without any issues. Denies syncope.  He is getting his meds via PEG tube. His wife accompanies him to clinic today. 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 He denies any bleeding on warfarin. He is in sinus rhythm today. There are no events/episodes on his pacemaker. TTE (11/2016): EF>55%, moderate pulmonary HTN, mild TR, moderately dilated RV    Past Medical History:   Diagnosis Date   ??? Cancer (CMS-HCC)    ??? Cirrhosis (CMS-HCC)    ??? Diabetes (CMS-HCC)    ??? Hepatitis C 07/17/2012   ??? Liver disease    ??? Low back pain 07/17/2012   ??? Varices, esophageal (CMS-HCC)        Past Surgical History:   Procedure Laterality Date   ??? ANKLE SURGERY     ??? BACK SURGERY     ??? BACK SURGERY     ??? CHG US GUIDE, TISSUE ABLATION N/A 01/22/2016    Procedure: ULTRASOUND GUIDANCE FOR, AND MONITORING OF, PARENCHYMAL TISSUE ABLATION;  Surgeon: Particia Nearing, MD;  Location: MAIN OR [facility];  Service: Transplant   ??? IR EMBOLIZATION ORGAN ISCHEMIA, TUMORS, INFAR  06/16/2016    IR EMBOLIZATION ORGAN ISCHEMIA, TUMORS, INFAR 06/16/2016 Ammie Dalton, MD IMG VIR [facility]   ??? PR COLON CA SCRN NOT HI RSK IND N/A 02/27/2015    Procedure: COLOREC CNCR SCR;COLNSCPY NO;  Surgeon: Vonda Antigua, MD;  Location: [facility]URES [facility];  Service: Gastroenterology   ??? PR ENDOSCOPIC ULTRASOUND EXAM N/A 02/27/2015    Procedure: UGI ENDO; W/ENDO ULTRASOUND EXAM INCLUDES ESOPHAGUS, STOMACH, &/OR DUODENUM/JEJUNUM;  Surgeon: Vonda Antigua, MD;  Location: [facility];  Service: Gastroenterology   ??? PR ERCP STENT PLACEMENT BILIARY/PANCREATIC DUCT N/A 11/29/2016    Procedure: ENDOSCOPIC RETROGRADE CHOLANGIOPANCREATOGRAPHY (ERCP); WITH PLACEMENT OF ENDOSCOPIC STENT INTO BILIARY OR PANCREATIC DUCT;  Surgeon: Chriss Driver, MD;  Location: GI PROC[facility]URES [facility];  Service: Gastroenterology   ??? PR ERCP,W/REMOVAL STONE,BIL/PANCR DUCTS N/A 11/29/2016 Procedure: ERCP; W/ENDOSCOPIC RETROGRADE REMOVAL OF CALCULUS/CALCULI FROM BILIARY &/OR PANCREATIC DUCTS;  Surgeon: Chriss Driver, MD;  Location: [facility];  Service: Gastroenterology   ??? PR INSER HEART TEMP PACER ONE CHMBR N/A 10/02/2016    Procedure: Tempoarary Pacemaker Insertion;  Surgeon: Meredith Leeds, MD;  Location: [facility];  Service: Cardiology   ??? PR LAP,DIAGNOSTIC ABDOMEN N/A 01/22/2016    Procedure: Laparoscopy, Abdomen, Peritoneum, & Omentum, Diagnostic, W/Wo Collection Specimen(S) By Brushing Or Washing;  Surgeon: Particia Nearing, MD;  Location: MAIN OR [facility];  Service: Transplant   ??? PR PLACE PERCUT GASTROSTOMY TUBE N/A 11/17/2016    Procedure: UGI ENDO; W/DIRECTED PLCMT PERQ GASTROSTOMY TUBE;  Surgeon: Cletis Athens, MD;  Location: [facility];  Service: Gastroenterology   ??? PR TRACHEOSTOMY, PLANNED N/A 09/29/2016    Procedure: TRACHEOSTOMY PLANNED (SEPART PROC);  Surgeon: Katherina Mires, MD;  Location: MAIN OR [facility];  Service: Trauma   ??? PR TRANSCATH INSERT OR REPLACE LEADLESS PM VENTR N/A 10/11/2016    Procedure: Pacemaker Implant/Replace Leadless;  Surgeon: Meredith Leeds, MD;  Location: [facility];  Service: Cardiology   ??? PR TRANSPLANT LIVER,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    Procedure: Liver Allotransplantation; Orthotopic, Partial Or Whole, From Cadaver  Or Living Donor, Any Age;  Surgeon: Doyce Loose, MD;  Location: [facility];  Service: Transplant   ??? PR TRANSPLANT,PREP DONOR LIVER, WHOLE N/A 09/15/2016    Procedure: Rogelia Boga Std Prep Cad Donor Whole Liver Gft Prior Tnsplnt,Inc Chole,Diss/Rem Surr Tissu Wo Triseg/Lobe Splt;  Surgeon: Doyce Loose, MD;  Location: MAIN OR [facility];  Service: Transplant   ??? PR UPPER GI ENDOSCOPY,BIOPSY N/A 07/17/2012    Procedure: UGI ENDOSCOPY; WITH BIOPSY, SINGLE OR MULTIPLE;  Surgeon: Alba Destine, MD;  Location: [facility]URES [facility];  Service: Gastroenterology   ??? PR UPPER GI ENDOSCOPY,DIAGNOSIS N/A 02/04/2014    Procedure: UGI ENDO, INCLUDE ESOPHAGUS, STOMACH, & DUODENUM &/OR JEJUNUM; DX W/WO COLLECTION SPECIMN, BY BRUSH OR WASH;  Surgeon: Wilburt Finlay, MD;  Location: GI PROC[facility]URES [facility];  Service: Gastroenterology   ??? PR UPPER GI ENDOSCOPY,LIGAT VARIX N/A 11/05/2013    Procedure: UGI ENDO; W/BAND LIG ESOPH &/OR GASTRIC VARICES;  Surgeon: Wilburt Finlay, MD;  Location: GI [facility]URES [facility];  Service: Gastroenterology       Current Outpatient Prescriptions on File Prior to Visit   Medication Sig Dispense Refill   ??? acetaminophen (TYLENOL) 160 mg/5 mL solution Take 10.2 mL (325 mg total) by mouth every four (4) hours as needed for pain. 300 mL 0   ??? amiodarone (PACERONE) 200 MG tablet 1 tablet (200 mg total) by G-tube route daily. 30 tablet 11   ??? blood sugar diagnostic (FREESTYLE TEST) Strp by Other route Four (4) times a day (before meals and nightly). 100 each 11   ??? blood-glucose meter (GLUCOSE MONITORING KIT) kit Use as instructed 1 each 0   ??? cholecalciferol, vitamin D3, 2,000 unit cap 1 capsule (2,000 Units total) by PEG Tube route daily. 30 capsule 11   ??? digoxin (LANOXIN) 125 mcg tablet 0.5 tablets (62.5 mcg total) by G-tube route daily. 15 tablet 11   ??? insulin ASPART (NOVOLOG FLEXPEN U-100 INSULIN) 100 unit/mL injection pen Inject 0.04 mL (4 Units total) under the skin Three (3) times a day before meals. 4 mL 11   ??? insulin glargine (LANTUS) 100 unit/mL (3 mL) injection pen Inject 0.12 mL (12 Units total) under the skin nightly. 4 mL 11   ??? lancets Misc 1 each by Miscellaneous route Four (4) times a day (before meals and nightly). 100 each 11   ??? magnesium oxide (MAG-OX) 400 mg (241.3 mg magnesium) tablet 1 tablet (400 mg total) by G-tube route Three (3) times a day. 90 tablet 11   ??? omeprazole (PRILOSEC) 20 MG capsule Take 1 capsule (20 mg total) by mouth daily. 30 capsule 1   ??? pen needle, diabetic (ULTICARE PEN NEEDLE) 32 gauge x 5/32 Ndle 1 each by Miscellaneous route Four (4) times a day (before meals and nightly). 90 each 3   ??? warfarin (COUMADIN) 3 MG tablet 1 tablet (3 mg total) by G-tube route daily with evening meal. 30 tablet 11   ??? mycophenolate (CELLCEPT) 200 mg/mL suspension 1.3 mL (260 mg total) by G-tube route Two (2) times a day. (Patient taking differently: 500 mg by G-tube route Two (2) times a day. ) 175 mL 12     No current facility-administered medications on file prior to visit.        No Known Allergies    Social History:  Social History     Social History   ??? Marital status: Married     Spouse name: N/A   ???  Number of children: N/A   ??? Years of education: N/A     Social History Main Topics   ??? Smoking status: Never Smoker   ??? Smokeless tobacco: Never Used      Comment: Smoked in high school for about 5 years.    ??? Alcohol use No   ??? Drug use: No   ??? Sexual activity: Yes     Partners: Female     Birth control/ protection: None     Other Topics Concern   ??? None     Social History Narrative   ??? None     Denies tobacco, significant alcohol, or drug use    Family History:  Family History   Problem Relation Age of Onset   ??? Hypertension Mother    ??? Cirrhosis Neg Hx    ??? Liver cancer Neg Hx      Denies family history of afib, sudden cardiac death, pacemaker or ICD implantation.     ROS:    The balance of 10/12 systems are negative aside from that stated in the HPI          Objective:       Physical Exam (reviewed and updated)  Vitals:    01/06/17 0924   BP: 141/70   Pulse: 68   SpO2: 99%     Eyes: EOMI, anicteric  ENT: OP clear with MMM  Neck: Supple without LAD, no JVD, no carotid bruits.  Resp: CTA bilaterally with normal WOB.  CV: RRR, no audible murmurs.  Abd: Soft, NT/ND without HSM.  Ext: 2+ radial and DP pulses bilaterally.  No LE edema.  Neuro: A&O, grossly non-focal  Psych: Appropriate affect      Lab Review   Lab Results   Component Value Date    WBC 3.4 (L) 01/13/2017    HGB 9.6 (L) 01/13/2017    HCT 29.5 (L) 01/13/2017    PLT 127 (L) 01/13/2017       Lab Results   Component Value Date    NA 131 (L) 01/13/2017    K 3.5 01/13/2017    CL 101 01/13/2017    CO2 24.0 01/13/2017    BUN 20 01/13/2017    CREATININE 1.19 01/13/2017    GLU 190 (H) 01/13/2017    CALCIUM 8.3 (L) 01/13/2017    MG 1.3 (L) 01/12/2017    PHOS 3.3 01/12/2017       Lab Results   Component Value Date    BILITOT 1.1 01/13/2017    BILIDIR 0.80 (H) 01/13/2017    PROT 5.0 (L) 01/13/2017    ALBUMIN 2.7 (L) 01/13/2017    ALT 115 (H) 01/13/2017    AST 42 01/13/2017    ALKPHOS 426 (H) 01/13/2017    GGT 812 (H) 01/11/2017       Lab Results   Component Value Date    LABPROT 14.5 (H) 01/09/2014    INR 2.42 01/11/2017    APTT 40.0 (H) 01/11/2017       None

## 2017-01-16 NOTE — Unmapped (Signed)
Confirmed with wife she has brought all medications, orange cards, pill box with her for today's visit.  Confirmed [facility] is filling mycophenolate tablets and will call patient when ready.

## 2017-01-17 MED ORDER — MAGNESIUM OXIDE 400 MG (241.3 MG MAGNESIUM) TABLET
ORAL_TABLET | Freq: Four times a day (QID) | ORAL | 11 refills | 0 days
Start: 2017-01-17 — End: 2017-01-25

## 2017-01-17 MED ORDER — MYCOPHENOLATE MOFETIL 250 MG CAPSULE
ORAL_CAPSULE | Freq: Two times a day (BID) | ORAL | 2 refills | 0.00000 days
Start: 2017-01-17 — End: 2017-02-06

## 2017-01-17 MED ORDER — WARFARIN 3 MG TABLET
ORAL_TABLET | Freq: Every day | ORAL | 11 refills | 0.00000 days
Start: 2017-01-17 — End: 2017-01-24

## 2017-01-17 NOTE — Unmapped (Signed)
TRANSPLANT SURGERY CLINIC NOTE    Assessment and Plan      Jeffrey Ward is a 66 y.o. male who underwent liver transplant on 09/15/2016 with complicated post-operative course. Jeffrey Ward presents today for hospital follow up and to review medications.    All infectious type processes have resolved. Jeffrey Ward reports feeling good today.    Goal of care still remains to increase Jeffrey Ward independence with his care in hopes to decrease caregiver burden as well. Patient still does not have mycophenolate tablets. He and his wife brought the Mic-E button to clinic but unfortunately this cannot be completed in clinic and per the RN coordinator there is some insurance issue to be addressed. It is hopeful that Jeffrey Ward can transition to all oral medications, his diet has improved significantly.    Appreciate all medication changes per pharmacy.    Return to Clinic: Monday with Dr. Celine Mans  Labs: Weekly    ______________________________________________________________________    HPI: Jeffrey Ward is a [phone] with IDDM, GERD, prior perioperative a-fib with RVR and cirrhosis secondary to chronic hepatitis C and HCC who underwent liver transplantation on 09/15/16.     Post-op course was complicated by decompensation secondary to acute right heart failure,??AKI requiring CRRT (resolved), severe pulmonary hypertension responsive to sildenafil, bradycardia/ asystole requiring pacemaker placement, atrial fibrilliation/ narrow complex tachycardia requiring anti-coagulation with warfarin and amiodarone/digoxin, malnutrition/debility, dysphagia requiring G tube placement, and altered mental status/delirium improved with transition to cyclosporine and psychiatric medications.     He was discharged to [facility] 9/13 and discharged to home 12/10/16. He was readmitted 12/19/16 for increased LFT's and poor PO intake. Transplant liver ultrasound showed patent CBD and non-obstructing debris within the bile duct. His LFT's improved without intervention.     He was most recently admitted from clinic 10/31-11/2 for complaints of vomiting, URI symptoms, fever and acute increase in LFTs suspicious for dehydration, coupled with a picture of lack of medication compliance and uncertainty if he was receiving his IS medications, concern for his wife, his primary caregiver. Work up was negative, he was rehydrated and LFTs normalized. He presents today for hospital follow, no acute complaints.     Past Medical History    Past Medical History:   Diagnosis Date   ??? Cancer (CMS-HCC)    ??? Cirrhosis (CMS-HCC)    ??? Diabetes (CMS-HCC)    ??? Hepatitis C 07/17/2012   ??? Liver disease    ??? Low back pain 07/17/2012   ??? Varices, esophageal (CMS-HCC)      Past Surgical History    Past Surgical History:   Procedure Laterality Date   ??? ANKLE SURGERY     ??? BACK SURGERY     ??? BACK SURGERY     ??? CHG US GUIDE, TISSUE ABLATION N/A 01/22/2016    Procedure: ULTRASOUND GUIDANCE FOR, AND MONITORING OF, PARENCHYMAL TISSUE ABLATION;  Surgeon: Particia Nearing, MD;  Location: MAIN OR [facility];  Service: Transplant   ??? IR EMBOLIZATION ORGAN ISCHEMIA, TUMORS, INFAR  06/16/2016    IR EMBOLIZATION ORGAN ISCHEMIA, TUMORS, INFAR 06/16/2016 Ammie Dalton, MD IMG VIR [facility]   ??? PR COLON CA SCRN NOT HI RSK IND N/A 02/27/2015    Procedure: COLOREC CNCR SCR;COLNSCPY NO;  Surgeon: Vonda Antigua, MD;  Location: GI PROCEDURES [facility];  Service: Gastroenterology   ??? PR ENDOSCOPIC ULTRASOUND EXAM N/A 02/27/2015    Procedure: UGI ENDO; W/ENDO ULTRASOUND EXAM INCLUDES ESOPHAGUS, STOMACH, &/OR DUODENUM/JEJUNUM;  Surgeon: Vonda Antigua, MD;  Location: GI PROCEDURES MEM[facility];  Service: Gastroenterology   ??? PR ERCP STENT PLACEMENT BILIARY/PANCREATIC DUCT N/A 11/29/2016    Procedure: ENDOSCOPIC RETROGRADE CHOLANGIOPANCREATOGRAPHY (ERCP); WITH PLACEMENT OF ENDOSCOPIC STENT INTO BILIARY OR PANCREATIC DUCT;  Surgeon: Chriss Driver, MD;  Location: GI PROCEDURES [facility]IAL [facility];  Service: Gastroenterology ??? PR ERCP,W/REMOVAL STONE,BIL/PANCR DUCTS N/A 11/29/2016    Procedure: ERCP; W/ENDOSCOPIC RETROGRADE REMOVAL OF CALCULUS/CALCULI FROM BILIARY &/OR PANCREATIC DUCTS;  Surgeon: Chriss Driver, MD;  Location: GI PROCEDURES [facility];  Service: Gastroenterology   ??? PR INSER HEART TEMP PACER ONE CHMBR N/A 10/02/2016    Procedure: Tempoarary Pacemaker Insertion;  Surgeon: Meredith Leeds, MD;  Location: [facility];  Service: Cardiology   ??? PR LAP,DIAGNOSTIC ABDOMEN N/A 01/22/2016    Procedure: Laparoscopy, Abdomen, Peritoneum, & Omentum, Diagnostic, W/Wo Collection Specimen(S) By Brushing Or Washing;  Surgeon: Particia Nearing, MD;  Location: MAIN OR [facility];  Service: Transplant   ??? PR PLACE PERCUT GASTROSTOMY TUBE N/A 11/17/2016    Procedure: UGI ENDO; W/DIRECTED PLCMT PERQ GASTROSTOMY TUBE;  Surgeon: Cletis Athens, MD;  Location: GI PROCEDURES [facility];  Service: Gastroenterology   ??? PR TRACHEOSTOMY, PLANNED N/A 09/29/2016    Procedure: TRACHEOSTOMY PLANNED (SEPART PROC);  Surgeon: Katherina Mires, MD;  Location: MAIN OR [facility];  Service: Trauma   ??? PR TRANSCATH INSERT OR REPLACE LEADLESS PM VENTR N/A 10/11/2016    Procedure: Pacemaker Implant/Replace Leadless;  Surgeon: Meredith Leeds, MD;  Location: [facility];  Service: Cardiology   ??? PR TRANSPLANT LIVER,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    Procedure: Liver Allotransplantation; Orthotopic, Partial Or Whole, From Cadaver Or Living Donor, Any Age;  Surgeon: Doyce Loose, MD;  Location: MAIN OR [facility];  Service: Transplant   ??? PR TRANSPLANT,PREP DONOR LIVER, WHOLE N/A 09/15/2016    Procedure: Rogelia Boga Std Prep Cad Donor Whole Liver Gft Prior Tnsplnt,Inc Chole,Diss/Rem Surr Tissu Wo Triseg/Lobe Splt;  Surgeon: Doyce Loose, MD;  Location: MAIN OR [facility];  Service: Transplant   ??? PR UPPER GI ENDOSCOPY,BIOPSY N/A 07/17/2012    Procedure: UGI ENDOSCOPY; WITH BIOPSY, SINGLE OR MULTIPLE;  Surgeon: Alba Destine, MD;  Location: GI PROCEDURES [facility];  Service: Gastroenterology   ??? PR UPPER GI ENDOSCOPY,DIAGNOSIS N/A 02/04/2014    Procedure: UGI ENDO, INCLUDE ESOPHAGUS, STOMACH, & DUODENUM &/OR JEJUNUM; DX W/WO COLLECTION SPECIMN, BY BRUSH OR WASH;  Surgeon: Wilburt Finlay, MD;  Location: GI PROCEDURES [facility];  Service: Gastroenterology   ??? PR UPPER GI ENDOSCOPY,LIGAT VARIX N/A 11/05/2013    Procedure: UGI ENDO; W/BAND LIG ESOPH &/OR GASTRIC VARICES;  Surgeon: Wilburt Finlay, MD;  Location: GI PROC[facility]URES [facility];  Service: Gastroenterology     MEDICATIONS:  Current Outpatient Prescriptions   Medication Sig Dispense Refill   ??? acetaminophen (TYLENOL) 160 mg/5 mL solution Take 10.2 mL (325 mg total) by mouth every four (4) hours as needed for pain. 300 mL 0   ??? amiodarone (PACERONE) 200 MG tablet 1 tablet (200 mg total) by G-tube route daily. 30 tablet 11   ??? blood sugar diagnostic (FREESTYLE TEST) Strp by Other route Four (4) times a day (before meals and nightly). 100 each 11   ??? blood-glucose meter (GLUCOSE MONITORING KIT) kit Use as instructed 1 each 0   ??? cholecalciferol, vitamin D3, 2,000 unit cap 1 capsule (2,000 Units total) by PEG Tube route daily. 30 capsule 11   ??? digoxin (LANOXIN) 125 mcg tablet 0.5 tablets (62.5 mcg total) by G-tube route daily.  15 tablet 11   ??? docusate (COLACE) 50 mg/5 mL liquid Take 100 mg by mouth two (2) times a day as needed.     ??? gabapentin (NEURONTIN) 100 MG capsule Take 1 capsule (100 mg total) by mouth Three (3) times a day. 270 capsule 3   ??? insulin ASPART (NOVOLOG FLEXPEN U-100 INSULIN) 100 unit/mL injection pen Inject 0.04 mL (4 Units total) under the skin Three (3) times a day before meals. 4 mL 11   ??? insulin glargine (LANTUS) 100 unit/mL (3 mL) injection pen Inject 0.12 mL (12 Units total) under the skin nightly. 4 mL 11   ??? lancets Misc 1 each by Miscellaneous route Four (4) times a day (before meals and nightly). 100 each 11   ??? magnesium oxide (MAG-OX) 400 mg (241.3 mg magnesium) tablet 1 tablet (400 mg total) by G-tube route Three (3) times a day. 90 tablet 11   ??? magnesium oxide-Mg AA chelate 133 mg Tab Take 1 tablet by mouth Two (2) times a day. Picking up today 60 tablet 0   ??? melatonin 5 mg cap Take 1 capsule by mouth nightly as needed.     ??? mycophenolate (CELLCEPT) 200 mg/mL suspension 1.3 mL (260 mg total) by G-tube route Two (2) times a day. (Patient taking differently: 500 mg by G-tube route Two (2) times a day. ) 175 mL 12   ??? NEORAL 100 mg capsule Take 2 capsules (200 mg total) by mouth Two (2) times a day. 120 capsule 11   ??? omeprazole (PRILOSEC) 20 MG capsule Take 1 capsule (20 mg total) by mouth daily. 30 capsule 1   ??? pen needle, diabetic (ULTICARE PEN NEEDLE) 32 gauge x 5/32 Ndle 1 each by Miscellaneous route Four (4) times a day (before meals and nightly). 90 each 3   ??? polyethylene glycol (GLYCOLAX) 17 gram/dose powder Take 17 g by mouth daily as needed.     ??? sildenafil, antihypertensive, (REVATIO) 20 mg tablet Take 2 tablets (40 mg total) by mouth Three (3) times a day. 180 tablet 11   ??? sulfamethoxazole-trimethoprim (BACTRIM,SEPTRA) 400-80 mg per tablet Take 1 tablet (80 mg of trimethoprim total) by mouth 3 (three) times a week. 27 tablet 0   ??? traMADol (ULTRAM) 50 mg tablet Take 50 mg by mouth. Every 4 to 6 hours PRN     ??? warfarin (COUMADIN) 3 MG tablet 1 tablet (3 mg total) by G-tube route daily with evening meal. 30 tablet 11     No current facility-administered medications for this visit.      ALLERGIES:  Patient has no known allergies.    Review of Systems  A 12 point review of systems was negative except for pertinent items noted in the HPI.     Physical Exam:   Blood pressure 135/62, pulse 78, temperature 36.3 ??C (97.3 ??F), temperature source Tympanic, height 149.9 cm (4' 11.02), weight 64.8 kg (142 lb 14.4 oz).  Body mass index is 28.85 kg/m??.    General - NAD, breathing comfortably, somnolent but easily arusable  Neuro - alert, oriented, and interactive  Resp - breathing comfortably on room air  CV - sinus rhythm, hemodynamically normal/stable  Abd - soft, nondistended, nontender; Mercedes incision c/d/i, 76F G-tube in place.  Ext - warm and well perfused, uses walker    LABS:     Lab Results   Component Value Date    WBC 3.4 (L) 01/13/2017    HGB 9.6 (L) 01/13/2017  HCT 29.5 (L) 01/13/2017    PLT 127 (L) 01/13/2017     Lab Results   Component Value Date    NA 131 (L) 01/13/2017    K 3.5 01/13/2017    CL 101 01/13/2017    CO2 24.0 01/13/2017    BUN 20 01/13/2017    CREATININE 1.19 01/13/2017    CALCIUM 8.3 (L) 01/13/2017    MG 1.3 (L) 01/12/2017    PHOS 3.3 01/12/2017     Lab Results   Component Value Date    ALKPHOS 426 (H) 01/13/2017    BILITOT 1.1 01/13/2017    BILIDIR 0.80 (H) 01/13/2017    PROT 5.0 (L) 01/13/2017    ALBUMIN 2.7 (L) 01/13/2017    ALT 115 (H) 01/13/2017    AST 42 01/13/2017    GGT 812 (H) 01/11/2017       Imaging:   No results found.    _____________________________        Ermalinda Barrios, DNP, APRN, FNP-C  [facility]  [address]  [place], [place]  [address]

## 2017-01-17 NOTE — Unmapped (Signed)
[facility] TRANSPL[facility]INIC PHARMACY NOTE  01/17/2017   Jeffrey Ward  [id number]    Medication changes today:   1. Amiodarone 200mg  D/C'd per patients wife; received a call from [facility] cardiology for patient to stop taking  2. D/C'd asa 81mg  from med list; per pts wife received call to stop taking   3. Added gabapentin 100mg  TID for pain  4. Added MG Plus Protein BID   5. Changed Cellcept for oral suspension to 250mg  capsule BID     Education/Adherence tools provided today:  1. provided updated medication list   2. Provided assistance with pill box fill   3. Reviewed appropriate monitoring of blood sugars and education on insulin regimen  4. Provided education on immunosuppression side effects and dosing regimen    Follow up items:  1. Continue to assess wife's ability to fill pill box   2. Follow-up with tolerability of oral medications  3. Assess BG log for 2 weeks with new instructions to check TID before meals   4. Stop vitamin b next visit  5. Consolidate timing of medication administration. Did not change this visit because pill box was mostly filled and did not want to further confuse the patient.     Next visit with pharmacy in 2 weeks   ____________________________________________________________________    Jeffrey Ward is a 66 y.o. 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 Course was complicated by cardiogenic shock due to right heart failure, a fib/atrial flutter, pulmonary hypertension, AKI requiring CRRT (now resolved), s/p permanent pacemaker placement, PEG placement 11/17/16 for dysphagia.     Concern for rejection on 8/18, found to have mild-moderate intrahepatic biliary duct dilation. No acute rejection on liver biopsy on 11/04/16, but did receive steroids at this time for presumed rejection.     Other PMH significant for diabetes     Seen by pharmacy today for: medication management and pill box fill and adherence; last seen by pharmacy 1 week ago  CC:  Patient complains of back pain, tingling in legs (particularly left leg)    Vitals:    01/16/17 1319   BP: 135/62   Pulse: 78   Temp: 36.3 ??C (97.3 ??F)       No Known Allergies  All medications reviewed and updated.   Medication list includes revisions made during today???s encounter    Outpatient Encounter Prescriptions as of 01/16/2017   Medication Sig Dispense Refill   ??? acetaminophen (TYLENOL) 160 mg/5 mL solution Take 10.2 mL (325 mg total) by mouth every four (4) hours as needed for pain. 300 mL 0   ??? blood sugar diagnostic (FREESTYLE TEST) Strp by Other route Four (4) times a day (before meals and nightly). 100 each 11   ??? blood-glucose meter (GLUCOSE MONITORING KIT) kit Use as instructed 1 each 0   ??? cholecalciferol, vitamin D3, 2,000 unit cap 1 capsule (2,000 Units total) by PEG Tube route daily. 30 capsule 11   ??? digoxin (LANOXIN) 125 mcg tablet 0.5 tablets (62.5 mcg total) by G-tube route daily. 15 tablet 11   ??? docusate (COLACE) 50 mg/5 mL liquid Take 100 mg by mouth two (2) times a day as needed.     ??? gabapentin (NEURONTIN) 100 MG capsule Take 1 capsule (100 mg total) by mouth Three (3) times a day. 270 capsule 3   ??? insulin ASPART (NOVOLOG FLEXPEN U-100 INSULIN) 100 unit/mL injection pen Inject 0.04 mL (4 Units total) under the skin Three (3) times a  day before meals. 4 mL 11   ??? insulin glargine (LANTUS) 100 unit/mL (3 mL) injection pen Inject 0.12 mL (12 Units total) under the skin nightly. 4 mL 11   ??? lancets Misc 1 each by Miscellaneous route Four (4) times a day (before meals and nightly). 100 each 11   ??? magnesium oxide (MAG-OX) 400 mg (241.3 mg magnesium) tablet Take 1 tablet (400 mg total) by mouth Four (4) times a day. 120 tablet 11   ??? magnesium oxide-Mg AA chelate 133 mg Tab Take 1 tablet by mouth Two (2) times a day. Picking up today 60 tablet 0   ??? melatonin 5 mg cap Take 1 capsule by mouth nightly as needed.     ??? mycophenolate (CELLCEPT) 250 mg capsule Take 1 capsule (250 mg total) by mouth Two (2) times a day. 240 capsule 2   ??? NEORAL 100 mg capsule Take 2 capsules (200 mg total) by mouth Two (2) times a day. 120 capsule 11   ??? omeprazole (PRILOSEC) 20 MG capsule Take 1 capsule (20 mg total) by mouth daily. 30 capsule 1   ??? pen needle, diabetic (ULTICARE PEN NEEDLE) 32 gauge x 5/32 Ndle 1 each by Miscellaneous route Four (4) times a day (before meals and nightly). 90 each 3   ??? polyethylene glycol (GLYCOLAX) 17 gram/dose powder Take 17 g by mouth daily as needed.     ??? sildenafil, antihypertensive, (REVATIO) 20 mg tablet Take 2 tablets (40 mg total) by mouth Three (3) times a day. 180 tablet 11   ??? sulfamethoxazole-trimethoprim (BACTRIM,SEPTRA) 400-80 mg per tablet Take 1 tablet (80 mg of trimethoprim total) by mouth 3 (three) times a week. 27 tablet 0   ??? traMADol (ULTRAM) 50 mg tablet Take 50 mg by mouth. Every 4 to 6 hours PRN     ??? warfarin (COUMADIN) 3 MG tablet Take 1 tablet (3 mg total) by mouth daily with evening meal. 30 tablet 11   ??? [DISCONTINUED] amiodarone (PACERONE) 200 MG tablet 1 tablet (200 mg total) by G-tube route daily. 30 tablet 11   ??? [DISCONTINUED] magnesium oxide (MAG-OX) 400 mg (241.3 mg magnesium) tablet 1 tablet (400 mg total) by G-tube route Three (3) times a day. 90 tablet 11   ??? [DISCONTINUED] mycophenolate (CELLCEPT) 200 mg/mL suspension 1.3 mL (260 mg total) by G-tube route Two (2) times a day. (Patient taking differently: 500 mg by G-tube route Two (2) times a day. ) 175 mL 12   ??? [DISCONTINUED] warfarin (COUMADIN) 3 MG tablet 1 tablet (3 mg total) by G-tube route daily with evening meal. 30 tablet 11     No facility-administered encounter medications on file as of 01/16/2017.      Induction agent : steroids only     CURRENT IMMUNOSUPPRESSION: cyclosporine 200 mg PO BID  prograf/cyclosporine goal: 150 - 200  Cellcept 250 mg PO BID (pt taking liquid still)   Steroid free     Patient complaining of back pain and tingling in legs; previously had complaints of nausea/vomiting and diarrhea that have resolved     IMMUNOSUPPRESSION DRUG LEVELS:  Lab Results   Component Value Date    TACROLIMUS 10.6 10/21/2016    TACROLIMUS 11.3 10/20/2016    TACROLIMUS 10.6 10/19/2016     Lab Results   Component Value Date    CYCLO 181 01/09/2017    CYCLO 255 01/06/2017    CYCLO 237 01/02/2017     No results found for: EVEROLIMUS  No  results found for: SIROLIMUS    CSA level is accurate 12 hour trough     Graft function:improving   DSA:ntd   Biopsies to date: 11/04/16 - no rejection indicated   WBC/ANC: wnl    Plan: Cellcept changed to capsules, wife picked them up at pharmacy after appointment. Instructed wife to keep a bottle of the suspension in case patient doesn't tolerate capsules. LFTs improved from 11/2. Was not able to put it in the pill box today because he did not have it with him.     ID prophylaxis:   CMV Status: D+/R+, moderate risk CMV prophylaxis: valganciclovir 450mg  daily x 3 months per protocol. Completed  No results found for: CMVCP  PCP Prophylaxis: bactrim SS 1 tab MWF x 6 months. Stop date 03/18/17   Thrush: completed   Patient is tolerating infectious prophylaxis well, tolerated bactrim tablets  Plan: continue bactrim per protocol     CV Prophylaxis: asa 81mg  D/C'd due to warfarin use   The 10-year ASCVD risk score Denman George DC Jr., et al., 2013) is: 27.1%  Statin therapy: indicated, consider initiating statin at future visit. Patient indicated for high-intensity statin but wife is still working on managing current medications. Consider starting once adherence to current regimen has been attained.     BP: Goal < 140/90. Clinic vitals reported above  Home BP ranges: 106-158/57-88  Current meds include: none at this time   Plan: within goal. Continue to monitor    Atrial fibrillations:  Medications currently on: digoxin 62.5 mcg daily and warfarin 3mg  qHS  Digoxin level: 0.8 (10/31)  INR: 2.42, goal 2-3   TSH: 8.26, Free T4: 1.98   Plan: continue warfarin 3mg  qHS, continue current digoxin dose.   Continue to monitor serum K and Mg and replace up to 4.0 and 2.0, respectively, to reduce risk of arrythmia   Continue to monitor INR and digoxin levels.   Amiodarone was dc'ed by cardiology today d/t concern with liver and thyroid function.     Pulmonary Hypertension:  Meds currently on: sildenafil 40mg  TID   Plan: continue current sildenafil dose.     Anemia:  H/H:   Lab Results   Component Value Date    HGB 9.6 (L) 01/13/2017     Lab Results   Component Value Date    HCT 29.5 (L) 01/13/2017     Iron panel:  Lab Results   Component Value Date    IRON 169 (H) 11/24/2015    TIBC 190.8 (L) 11/24/2015    FERRITIN 286.0 11/24/2015     Lab Results   Component Value Date    LABIRON 89 (H) 11/24/2015     Prior ESA use: NTD  Plan: within goal. Continue to monitor.     DM:   Lab Results   Component Value Date    A1C 5.1 12/26/2016   . Goal A1c < 7  History of Dm? 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 Pt has a primary care that he saw a few days ago however at this time, however he is not primarily managing the BS at this time.  Currently on: Lantus 12 units qHS, Novolog 4 units with meals + sliding scale insulin   Home BS log: values at goal in morning but not at goal with evening check. Wife checking evening BG after meals. Values range 93-240. It is unclear what time of day the BS are being recorded. It is unclear how  much novolog the patient is getting because wife is not checking blood sugars frequently.  Diet: has been eating more  Hypoglycemia:No   Plan: Counseled wife to take BG TID before meals consistently. Provided wife BG logs for her to record values. Instructed patient that we can temporarily help adjust insulin at this time since he sees [place] so frequently, however PCP will have to start managing this long term. Pt understands this plan. Follow-up at next visit     Electrolytes: K 3.5, Mg 1.3, Phos 3.3, Na 131  Meds currently on: Mag oxide 400mg  QID (pt was instructed TID, but is taking QID according to pill box)  Plan: D/t hx of diarrhea that has just recently improved, will not increase the mag ox at this time. Escripted mag plus protein rx to pharmacy and instructe pt to start BID in addition to mag oxide 400mg  QID. Will eventually taper of mag ox.  Continue to monitor electrolyte levels     BM: Patient having 1 BM daily. Formed but small. No reports of nausea/vomiting.   Meds currently on: Miralax PRN   Plan: Encouraged consistent use.  Continue to monitor     Pain:Patient complaining of lower back pain which he treats with tylenol. Pt states this is a chronic pain that he has had pre txp.  Wife has not given him tramadol but will begin to do so. Also complains of tingling in legs   Meds currently on: Acetaminophen 325 mg PRN, tramadol 50mg  PRN   Plan: Add gabapentin 100mg  TID for neuropathy. Counseled wife on acetaminophen use and to give tramadol if patient is in pain.     Bone health:   Vitamin D Level: last level 34.2. Goal > 30.   Last DEXA results: 11/30/15, scan indicated low bone density   Current meds include: cholecalciferol 2,000 units daily   Plan: Vitamin D level within goal, continue supplementation. Continue to monitor.     Insomnia:   Meds currently on: melatonin 3-6 mg nightly   Plan: patient continues to report getting up frequently at night. Counseled patient to try to avoid drinking water after 5PM and see if that aids with nighttime bathroom trips. Gabapentin may also assist in insomnia.     Women's/Men's Health:  Jeffrey Ward is a 66 y.o. male.  Patient reports reports no men's health issues   Plan: continue to monitor     Adherence: Patient hasp oor  understanding of medications; was not able to independently identify names/doses of immunosuppressants and OI meds. Patient's wife primarily manages medications. She has an improved understanding of his medications but continues to need assistance.   Patient  Does not fill their own pill box on a regular basis at home. Wife brought pill box, demonstrated a fill that was correct.   Patient brought medication card:yes  Pill box:was correct   Patient requested refills for the following meds: None  Corrections needed in Epic medication list: none  Plan: Wife continues to be overwhelmed with the medications but has improved. Continue to check pill box fills and wife's understanding of medications; provided moderate adherence counseling/intervention    Spent approximately 40 minutes on educating this patient and greater than 50% was spent in direct face to face counseling regarding post transplant medication education. Questions and concerns were address to patient's satisfaction.    Patient was reviewed with Dr. Celine Mans who was agreement with the stated plan:     During this visit, the following was completed:   BG  log data assessment  BP log data assessment  Labs ordered and evaluated  Complex treatment plan > 1DS   Patient education was completed for 25-60 minutes     All questions/concerns were addressed to the patient's satisfaction.  __________________________________________  Noah Charon, PHARMD, CPP  [employer] [facility]  PAGER [phone]

## 2017-01-18 NOTE — Unmapped (Signed)
Called neurology clinic at [phone] to check on patients referral. Scheduler stated they are little behind on scheduling but patient is approved by the provider and will be scheduled soon for his appointment. Message routed to patients transplant coordinator.

## 2017-01-18 NOTE — Unmapped (Signed)
Per VM from wife patient gabapentin Is not working well yet.  Per PharmD Nedra Hai patient dose jsut started and so he needs to give it a week to reach peak effectiveness.  Will consider increasing next week if still not working. Messaged [facility] to schedule patient with PharmD Nedra Hai on 01/23/17 and Dr. Celine Mans to discuss Mic-Key button.

## 2017-01-19 DIAGNOSIS — Z431 Encounter for attention to gastrostomy: Secondary | ICD-10-CM

## 2017-01-19 DIAGNOSIS — N400 Enlarged prostate without lower urinary tract symptoms: Secondary | ICD-10-CM

## 2017-01-19 DIAGNOSIS — M961 Postlaminectomy syndrome, not elsewhere classified: Secondary | ICD-10-CM

## 2017-01-19 DIAGNOSIS — K219 Gastro-esophageal reflux disease without esophagitis: Secondary | ICD-10-CM

## 2017-01-19 DIAGNOSIS — I272 Pulmonary hypertension, unspecified: Secondary | ICD-10-CM

## 2017-01-19 DIAGNOSIS — E46 Unspecified protein-calorie malnutrition: Secondary | ICD-10-CM

## 2017-01-19 DIAGNOSIS — Z794 Long term (current) use of insulin: Secondary | ICD-10-CM

## 2017-01-19 DIAGNOSIS — E1142 Type 2 diabetes mellitus with diabetic polyneuropathy: Secondary | ICD-10-CM

## 2017-01-19 DIAGNOSIS — G894 Chronic pain syndrome: Secondary | ICD-10-CM

## 2017-01-19 DIAGNOSIS — Z8505 Personal history of malignant neoplasm of liver: Secondary | ICD-10-CM

## 2017-01-19 DIAGNOSIS — Z6821 Body mass index (BMI) 21.0-21.9, adult: Secondary | ICD-10-CM

## 2017-01-19 DIAGNOSIS — Z8619 Personal history of other infectious and parasitic diseases: Secondary | ICD-10-CM

## 2017-01-19 DIAGNOSIS — Z950 Presence of cardiac pacemaker: Secondary | ICD-10-CM

## 2017-01-19 DIAGNOSIS — D649 Anemia, unspecified: Secondary | ICD-10-CM

## 2017-01-19 DIAGNOSIS — Z9181 History of falling: Secondary | ICD-10-CM

## 2017-01-19 DIAGNOSIS — Z7982 Long term (current) use of aspirin: Secondary | ICD-10-CM

## 2017-01-19 DIAGNOSIS — I4891 Unspecified atrial fibrillation: Secondary | ICD-10-CM

## 2017-01-19 DIAGNOSIS — Z4823 Encounter for aftercare following liver transplant: Principal | ICD-10-CM

## 2017-01-19 NOTE — Unmapped (Signed)
According to wife patient was shaking, slid off the bed twice, and couldn't walk yesterday around 7 pm  Stated this occurred after two doses of gabapentin in the morning and dinner .  Patient could not move arms or legs.  Was not confused.     Patient is currently able to walk but does feel weak.  Patient is eating and drinking water about 5-6 bottles of water/day.     Asked wife why she did not call the on-call coordinator.  Stated she did not have the phone number.  Did extensive education about the main [facility] hospital phone number is who she calls asking for the liver transplant coordinator on-call.  Provided the [phone] number for future reference and let her know the on-call is available 24/7.  She verbalized understanding and agreed ot get labs drawn tomorrow at [facility] for patient.  Let her know they will be drawn as STAT.    Spoke with Dr.Desai about situation and agreed with plan of care since patient was assessed by home health nurse today, will get labs tomorrow that will result quickly, patient condition is improving, and will be seeing [facility] provider tomorrow by 10 am.      Called in Verbal order for patient to get liver transplant labs Mondays and Thursdays between [phone] with [facility] home health.

## 2017-01-20 ENCOUNTER — Ambulatory Visit
Admission: RE | Admit: 2017-01-20 | Discharge: 2017-01-20 | Disposition: A | Discharge status: Home / Self Care | Payer: MEDICARE

## 2017-01-20 ENCOUNTER — Ambulatory Visit
Admission: RE | Admit: 2017-01-20 | Discharge: 2017-01-20 | Disposition: A | Discharge status: Home / Self Care | Admitting: Internal Medicine

## 2017-01-20 DIAGNOSIS — Z79899 Other long term (current) drug therapy: Secondary | ICD-10-CM

## 2017-01-20 DIAGNOSIS — Z794 Long term (current) use of insulin: Secondary | ICD-10-CM

## 2017-01-20 DIAGNOSIS — R7989 Other specified abnormal findings of blood chemistry: Principal | ICD-10-CM

## 2017-01-20 DIAGNOSIS — Z944 Liver transplant status: Secondary | ICD-10-CM

## 2017-01-20 DIAGNOSIS — E039 Hypothyroidism, unspecified: Secondary | ICD-10-CM

## 2017-01-20 DIAGNOSIS — K769 Liver disease, unspecified: Secondary | ICD-10-CM

## 2017-01-20 DIAGNOSIS — G629 Polyneuropathy, unspecified: Secondary | ICD-10-CM

## 2017-01-20 DIAGNOSIS — E118 Type 2 diabetes mellitus with unspecified complications: Secondary | ICD-10-CM

## 2017-01-20 LAB — CBC W/ AUTO DIFF
BASOPHILS ABSOLUTE COUNT: 0 10*9/L (ref 0.0–0.1)
EOSINOPHILS ABSOLUTE COUNT: 0 10*9/L (ref 0.0–0.4)
HEMATOCRIT: 29.5 % — ABNORMAL LOW (ref 41.0–53.0)
HEMOGLOBIN: 10 g/dL — ABNORMAL LOW (ref 13.5–17.5)
LARGE UNSTAINED CELLS: 3 % (ref 0–4)
LYMPHOCYTES ABSOLUTE COUNT: 0.6 10*9/L — ABNORMAL LOW (ref 1.5–5.0)
MEAN CORPUSCULAR HEMOGLOBIN: 31.8 pg (ref 26.0–34.0)
MEAN CORPUSCULAR VOLUME: 94.1 fL (ref 80.0–100.0)
MEAN PLATELET VOLUME: 8.4 fL (ref 7.0–10.0)
MONOCYTES ABSOLUTE COUNT: 0.3 10*9/L (ref 0.2–0.8)
NEUTROPHILS ABSOLUTE COUNT: 3.7 10*9/L (ref 2.0–7.5)
RED BLOOD CELL COUNT: 3.14 10*12/L — ABNORMAL LOW (ref 4.50–5.90)
RED CELL DISTRIBUTION WIDTH: 17.5 % — ABNORMAL HIGH (ref 12.0–15.0)
WBC ADJUSTED: 4.7 10*9/L (ref 4.5–11.0)

## 2017-01-20 LAB — COMPREHENSIVE METABOLIC PANEL
ALBUMIN: 3.2 g/dL — ABNORMAL LOW (ref 3.5–5.0)
ALKALINE PHOSPHATASE: 364 U/L — ABNORMAL HIGH (ref 38–126)
ALT (SGPT): 39 U/L (ref 19–72)
ANION GAP: 6 mmol/L — ABNORMAL LOW (ref 9–15)
AST (SGOT): 20 U/L (ref 19–55)
BLOOD UREA NITROGEN: 22 mg/dL — ABNORMAL HIGH (ref 7–21)
BUN / CREAT RATIO: 20
CALCIUM: 9.3 mg/dL (ref 8.5–10.2)
CHLORIDE: 104 mmol/L (ref 98–107)
CO2: 28 mmol/L (ref 22.0–30.0)
CREATININE: 1.09 mg/dL (ref 0.70–1.30)
EGFR MDRD NON AF AMER: >=60 mL/min/{1.73_m2} (ref >=60)
GLUCOSE RANDOM: 75 mg/dL (ref 65–179)
POTASSIUM: 3.8 mmol/L (ref 3.5–5.0)
PROTEIN TOTAL: 5.9 g/dL — ABNORMAL LOW (ref 6.5–8.3)
SODIUM: 138 mmol/L (ref 135–145)

## 2017-01-20 LAB — PROTIME: Lab: 33.6 — ABNORMAL HIGH

## 2017-01-20 LAB — NEUTROPHILS ABSOLUTE COUNT: Lab: 3.7

## 2017-01-20 LAB — PHOSPHORUS: Phosphate:MCnc:Pt:Ser/Plas:Qn:: 3.2

## 2017-01-20 LAB — BILIRUBIN DIRECT: Bilirubin.glucuronidated:MCnc:Pt:Ser/Plas:Qn:: 0.3

## 2017-01-20 LAB — MAGNESIUM: Magnesium:MCnc:Pt:Ser/Plas:Qn:: 1.5 — ABNORMAL LOW

## 2017-01-20 LAB — PROTIME-INR: INR: 2.95

## 2017-01-20 LAB — GAMMA GT: GAMMA GLUTAMYL TRANSFERASE: 403 U/L — ABNORMAL HIGH (ref 12–109)

## 2017-01-20 LAB — POTASSIUM: Potassium:SCnc:Pt:Ser/Plas:Qn:: 3.8

## 2017-01-20 LAB — GAMMA GLUTAMYL TRANSFERASE: Gamma glutamyl transferase:CCnc:Pt:Ser/Plas:Qn:: 403 — ABNORMAL HIGH

## 2017-01-20 MED ORDER — CAPSAICIN 0.025 % TOPICAL CREAM
Freq: Two times a day (BID) | TOPICAL | 0 refills | 0 days | Status: CP
Start: 2017-01-20 — End: 2017-05-25

## 2017-01-20 NOTE — Unmapped (Signed)
Please have labs collected in 1 month.    Please start capsaicin cream for your feet. You can apply this up to 3 times per day.

## 2017-01-20 NOTE — Unmapped (Signed)
[facility] Endocrinology Endocrine Follow    Assessment/Plan:  66 y.o. Male here for evaluation of hypothyroidism.  1. Abnormal thyroid labs. He had elevated TSH and free T4. Clinically, he does not have clear features of either hyper or hypothyroidism. This is commonly seen with amiodarone. As the amiodarone has been stopped, he should have his TFTs retested in 1 month. If abnormalities persist, further testing will be needed.  - Repeat TSH and free T4 in 1 month.    2. DM-2. Well controlled without significant hyper or hypoglycemia. He already follows with transplant and his PCP for this. I do not recommend changes at this time. He should follow up with me for diabetes if needed.    3. Neuropathy. Likely secondary to DM. I recommended over the counter capsaicin cream.    All tests and treatments were discussed with the patient who agreed to the plans as documented. They currently have no questions and agree to call the clinic or contact us if questions arise.    Return to clinic: As needed based on above results.    Alben Spittle, MD  [facility] Department of Endocrinology    Reason for Consult: Abnormal thyroid function tests.    Provider Requesting Consult: Rush Barer    PCP: [name], MD    History of present illness:  66 y.o. Male with relevant history of abnormal thyroid function tests, DM-2 and HCV cirrhosis s/p liver transplant in 09/2016 seen at the request of Dr. Rush Barer in consultation for evaluation of abnormal thyroid function tests. Initial diagnosis of abnormal thyroid function tests was on 11/01/2016. He had a prolonged hospital course after transplant which included A-fib for which he was on amiodarone. He has been on it for a number of weeks and stopped this medication last week due to a high TSH and a high free T4. [name] has had a slow recovery since his surgery and has chronic fatigue and weakness. He is currently using a walker for ambulation. He has chronic cold intolerance as well, which has been present for several years. He also reports an occasional tremor. He denies palpitations, unexpected weight change, diarrhea or constipation. Currently he feels at his baseline. He is fatigued and feels weak. He also complains of peripheral neuropathy symptoms. He tried gabapentin for this, but it caused severe fatigue and weakness.      Past Medical History:   Diagnosis Date   ??? Cancer (CMS-HCC)    ??? Cirrhosis (CMS-HCC)    ??? Diabetes (CMS-HCC)    ??? Hepatitis C 07/17/2012   ??? Liver disease    ??? Low back pain 07/17/2012   ??? Varices, esophageal (CMS-HCC)        Past Surgical History:   Procedure Laterality Date   ??? ANKLE SURGERY     ??? BACK SURGERY     ??? BACK SURGERY     ??? CHG US GUIDE, TISSUE ABLATION N/A 01/22/2016    Procedure: ULTRASOUND GUIDANCE FOR, AND MONITORING OF, PARENCHYMAL TISSUE ABLATION;  Surgeon: Particia Nearing, MD;  Location: MAIN OR [facility];  Service: Transplant   ??? IR EMBOLIZATION ORGAN ISCHEMIA, TUMORS, INFAR  06/16/2016    IR EMBOLIZATION ORGAN ISCHEMIA, TUMORS, INFAR 06/16/2016 Ammie Dalton, MD IMG VIR [facility]   ??? PR COLON CA SCRN NOT HI RSK IND N/A 02/27/2015    Procedure: COLOREC CNCR SCR;COLNSCPY NO;  Surgeon: Vonda Antigua, MD;  Location: [facility];  Service: Gastroenterology   ???  PR ENDOSCOPIC ULTRASOUND EXAM N/A 02/27/2015    Procedure: UGI ENDO; W/ENDO ULTRASOUND EXAM INCLUDES ESOPHAGUS, STOMACH, &/OR DUODENUM/JEJUNUM;  Surgeon: Vonda Antigua, MD;  Location: [facility]URES [facility];  Service: Gastroenterology   ??? PR ERCP STENT PLACEMENT BILIARY/PANCREATIC DUCT N/A 11/29/2016    Procedure: ENDOSCOPIC RETROGRADE CHOLANGIOPANCREATOGRAPHY (ERCP); WITH PLACEMENT OF ENDOSCOPIC STENT INTO BILIARY OR PANCREATIC DUCT;  Surgeon: Chriss Driver, MD;  Location: GI PROCEDURES [facility]ORIAL [facility];  Service: Gastroenterology   ??? PR ERCP,W/REMOVAL STONE,BIL/PANCR DUCTS N/A 11/29/2016    Procedure: ERCP; W/ENDOSCOPIC RETROGRADE REMOVAL OF CALCULUS/CALCULI FROM BILIARY &/OR PANCREATIC DUCTS;  Surgeon: Chriss Driver, MD;  Location: [facility]URES [facility];  Service: Gastroenterology   ??? PR INSER HEART TEMP PACER ONE CHMBR N/A 10/02/2016    Procedure: Tempoarary Pacemaker Insertion;  Surgeon: Meredith Leeds, MD;  Location: [facility];  Service: Cardiology   ??? PR LAP,DIAGNOSTIC ABDOMEN N/A 01/22/2016    Procedure: Laparoscopy, Abdomen, Peritoneum, & Omentum, Diagnostic, W/Wo Collection Specimen(S) By Brushing Or Washing;  Surgeon: Particia Nearing, MD;  Location: MAIN OR [facility];  Service: Transplant   ??? PR PLACE PERCUT GASTROSTOMY TUBE N/A 11/17/2016    Procedure: UGI ENDO; W/DIRECTED PLCMT PERQ GASTROSTOMY TUBE;  Surgeon: Cletis Athens, MD;  Location: GI [facility]URES [facility];  Service: Gastroenterology   ??? PR TRACHEOSTOMY, PLANNED N/A 09/29/2016    Procedure: TRACHEOSTOMY PLANNED (SEPART PROC);  Surgeon: Katherina Mires, MD;  Location: MAIN OR [facility];  Service: Trauma   ??? PR TRANSCATH INSERT OR REPLACE LEADLESS PM VENTR N/A 10/11/2016    Procedure: Pacemaker Implant/Replace Leadless;  Surgeon: Meredith Leeds, MD;  Location: [facility];  Service: Cardiology   ??? PR TRANSPLANT LIVER,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    Procedure: Liver Allotransplantation; Orthotopic, Partial Or Whole, From Cadaver Or Living Donor, Any Age;  Surgeon: Doyce Loose, MD;  Location: MAIN OR [facility];  Service: Transplant   ??? PR TRANSPLANT,PREP DONOR LIVER, WHOLE N/A 09/15/2016    Procedure: Rogelia Boga Std Prep Cad Donor Whole Liver Gft Prior Tnsplnt,Inc Chole,Diss/Rem Surr Tissu Wo Triseg/Lobe Splt;  Surgeon: Doyce Loose, MD;  Location: MAIN OR [facility];  Service: Transplant   ??? PR UPPER GI ENDOSCOPY,BIOPSY N/A 07/17/2012    Procedure: UGI ENDOSCOPY; WITH BIOPSY, SINGLE OR MULTIPLE;  Surgeon: Alba Destine, MD;  Location: GI [facility]URES [facility];  Service: Gastroenterology   ??? PR UPPER GI ENDOSCOPY,DIAGNOSIS N/A 02/04/2014    Procedure: UGI ENDO, INCLUDE ESOPHAGUS, STOMACH, & DUODENUM &/OR JEJUNUM; DX W/WO COLLECTION SPECIMN, BY BRUSH OR WASH;  Surgeon: Wilburt Finlay, MD;  Location: [facility]URES [facility];  Service: Gastroenterology   ??? PR UPPER GI ENDOSCOPY,LIGAT VARIX N/A 11/05/2013    Procedure: UGI ENDO; W/BAND LIG ESOPH &/OR GASTRIC VARICES;  Surgeon: Wilburt Finlay, MD;  Location: GI PROC[facility]URES [facility];  Service: Gastroenterology       Family History   Problem Relation Age of Onset   ??? Hypertension Mother    ??? Cirrhosis Neg Hx    ??? Liver cancer Neg Hx    ??? Anemia Neg Hx    ??? Cancer Neg Hx    ??? Diabetes Neg Hx    ??? Kidney disease Neg Hx    ??? Obesity Neg Hx    ??? Thyroid disease Neg Hx    ??? Osteoporosis Neg Hx    ??? Coronary artery disease Neg Hx        Social History     Social History   ???  Marital status: Married     Spouse name: N/A   ??? Number of children: N/A   ??? Years of education: N/A     Occupational History   ??? Not on file.     Social History Main Topics   ??? Smoking status: Never Smoker   ??? Smokeless tobacco: Never Used      Comment: Smoked in high school for about 5 years.    ??? Alcohol use No   ??? Drug use: No   ??? Sexual activity: Yes     Partners: Female     Birth control/ protection: None     Other Topics Concern   ??? Not on file     Social History Narrative   ??? No narrative on file       Review of systems:  General ROS:   10 system review of systems was performed and negative except as stated above.    Medication list:  Current Outpatient Prescriptions on File Prior to Visit   Medication Sig Dispense Refill   ??? acetaminophen (TYLENOL) 160 mg/5 mL solution Take 10.2 mL (325 mg total) by mouth every four (4) hours as needed for pain. 300 mL 0   ??? blood sugar diagnostic (FREESTYLE TEST) Strp by Other route Four (4) times a day (before meals and nightly). 100 each 11   ??? blood-glucose meter (GLUCOSE MONITORING KIT) kit Use as instructed 1 each 0   ??? cholecalciferol, vitamin D3, 2,000 unit cap 1 capsule (2,000 Units total) by PEG Tube route daily. 30 capsule 11   ??? digoxin (LANOXIN) 125 mcg tablet 0.5 tablets (62.5 mcg total) by G-tube route daily. 15 tablet 11   ??? docusate (COLACE) 50 mg/5 mL liquid Take 100 mg by mouth two (2) times a day as needed.     ??? gabapentin (NEURONTIN) 100 MG capsule Take 1 capsule (100 mg total) by mouth Three (3) times a day. 270 capsule 3   ??? insulin ASPART (NOVOLOG FLEXPEN U-100 INSULIN) 100 unit/mL injection pen Inject 0.04 mL (4 Units total) under the skin Three (3) times a day before meals. 4 mL 11   ??? insulin ASPART (NOVOLOG FLEXPEN) 100 unit/mL injection pen Inject 2 Units under the skin Three (3) times a day before meals. per SSI-  151-200= 2 units,  201-250= 4 units, 251-300=add 6 units, 301-350=8 units, 351-400=10 units, >400 =12 units     ??? insulin glargine (LANTUS) 100 unit/mL (3 mL) injection pen Inject 0.12 mL (12 Units total) under the skin nightly. 4 mL 11   ??? lancets Misc 1 each by Miscellaneous route Four (4) times a day (before meals and nightly). 100 each 11   ??? magnesium oxide (MAG-OX) 400 mg (241.3 mg magnesium) tablet Take 1 tablet (400 mg total) by mouth Four (4) times a day. 120 tablet 11   ??? magnesium oxide-Mg AA chelate 133 mg Tab Take 1 tablet by mouth Two (2) times a day. Picking up today 60 tablet 0   ??? melatonin 3 mg Tab Take 3 mg by mouth nightly as needed (1-2 tabs as needed for sleep).     ??? melatonin 5 mg cap Take 1 capsule by mouth nightly as needed.     ??? mycophenolate (CELLCEPT) 250 mg capsule Take 1 capsule (250 mg total) by mouth Two (2) times a day. 240 capsule 2   ??? NEORAL 100 mg capsule Take 2 capsules (200 mg total) by mouth Two (2) times a day. 120 capsule  11   ??? omeprazole (PRILOSEC) 20 MG capsule Take 1 capsule (20 mg total) by mouth daily. 30 capsule 1   ??? pen needle, diabetic (ULTICARE PEN NEEDLE) 32 gauge x 5/32 Ndle 1 each by Miscellaneous route Four (4) times a day (before meals and nightly). 90 each 3   ??? polyethylene glycol (GLYCOLAX) 17 gram/dose powder Take 17 g by mouth daily as needed.     ??? sildenafil, antihypertensive, (REVATIO) 20 mg tablet Take 2 tablets (40 mg total) by mouth Three (3) times a day. 180 tablet 11   ??? sulfamethoxazole-trimethoprim (BACTRIM,SEPTRA) 400-80 mg per tablet Take 1 tablet (80 mg of trimethoprim total) by mouth 3 (three) times a week. 27 tablet 0   ??? thiamine (B-1) 100 MG tablet Take 100 mg by mouth daily.     ??? traMADol (ULTRAM) 50 mg tablet Take 50 mg by mouth. Every 4 to 6 hours PRN     ??? warfarin (COUMADIN) 3 MG tablet Take 1 tablet (3 mg total) by mouth daily with evening meal. 30 tablet 11     No current facility-administered medications on file prior to visit.        Physical exam:  Vitals:    01/20/17 0939   BP: 135/72   Pulse: 67     Wt Readings from Last 3 Encounters:   01/20/17 64.8 kg (142 lb 12.8 oz)   01/16/17 64.8 kg (142 lb 14.4 oz)   01/16/17 64.8 kg (142 lb 14.4 oz)     Physical Exam   Constitutional: He is oriented to person, place, and time. No distress.   HENT:   Head: Normocephalic and atraumatic.   Right Ear: External ear normal.   Left Ear: External ear normal.   Nose: Nose normal.   Mouth/Throat: Oropharynx is clear and moist. No oropharyngeal exudate.   Eyes: Conjunctivae and EOM are normal. Right eye exhibits no discharge. Left eye exhibits no discharge. No scleral icterus.   Neck: Normal range of motion. Neck supple. No JVD present. No tracheal deviation present. No thyromegaly present.   Cardiovascular: Normal rate and regular rhythm.    No murmur heard.  Pulmonary/Chest: Effort normal and breath sounds normal. No stridor. No respiratory distress.   Abdominal: Soft. Bowel sounds are normal. He exhibits no distension. There is no tenderness.   Musculoskeletal: Normal range of motion. He exhibits no edema, tenderness or deformity.   Lymphadenopathy:     He has no cervical adenopathy.   Neurological: He is alert and oriented to person, place, and time. No cranial nerve deficit. Coordination normal.   Skin: Skin is warm and dry. No rash noted. He is not diaphoretic. No erythema. No pallor.   Psychiatric: He has a normal mood and affect. His behavior is normal.   Vitals reviewed.        Labs reviewed:  Lab Results   Component Value Date    A1C 5.1 12/26/2016       Lab Results   Component Value Date    NA 138 01/20/2017    K 3.8 01/20/2017    CL 104 01/20/2017    CO2 28.0 01/20/2017    BUN 22 (H) 01/20/2017    CREATININE 1.09 01/20/2017    GFR >= 60 02/15/2012    GLU 75 01/20/2017    CALCIUM 9.3 01/20/2017    ALBUMIN 3.2 (L) 01/20/2017    PHOS 3.2 01/20/2017       Lab Results   Component Value Date  TSH 8.260 (H) 12/26/2016       Results for ERASTO, SLEIGHT (MRN [id number]) as of 01/20/2017 10:07   Ref. Range 01/26/2016 19:45 09/26/2016 04:15 11/01/2016 06:29 11/02/2016 06:18 12/26/2016 08:58   TSH Latest Ref Range: 0.600 - 3.300 uIU/mL 2.120 2.641 4.094 (H)  8.260 (H)   Free T4 Latest Ref Range: 0.71 - 1.40 ng/dL    8.11 (H) 9.14 (H)       Other studies reviewed:  None

## 2017-01-20 NOTE — Unmapped (Signed)
Confirmed with wife labs look good.  Also reminded her of appointment times on Monday and to bring all medications, all VS logs, all sugar logs, and to bring Mic-key button with him to his clinic apt.  She verbalized understanding.

## 2017-01-21 LAB — CYCLOSPORINE, TROUGH: Lab: 165

## 2017-01-23 ENCOUNTER — Ambulatory Visit
Admission: RE | Admit: 2017-01-23 | Discharge: 2017-01-23 | Disposition: A | Discharge status: Home / Self Care | Payer: MEDICARE | Attending: Transplant Surgery | Admitting: Transplant Surgery

## 2017-01-23 ENCOUNTER — Ambulatory Visit
Admission: RE | Admit: 2017-01-23 | Discharge: 2017-01-23 | Disposition: A | Discharge status: Home / Self Care | Payer: MEDICARE | Attending: Pharmacist Clinician (PhC)/ Clinical Pharmacy Specialist | Admitting: Pharmacist Clinician (PhC)/ Clinical Pharmacy Specialist

## 2017-01-23 ENCOUNTER — Ambulatory Visit
Admission: RE | Admit: 2017-01-23 | Discharge: 2017-01-23 | Disposition: A | Discharge status: Home / Self Care | Payer: MEDICARE

## 2017-01-23 DIAGNOSIS — Z944 Liver transplant status: Principal | ICD-10-CM

## 2017-01-23 MED FILL — MG-PLUS PROTEIN/PROTEIN/TABS: MG-PLUS PROTEIN/PROTEIN/TABS | 50 days supply | Qty: 100 | Fill #0

## 2017-01-23 NOTE — Unmapped (Addendum)
TRANSPLANT SURGERY CLINIC NOTE    Assessment and Plan  Jeffrey Ward is a 66M with IDDM, GERD, prior perioperative a-fib with RVR and cirrhosis secondary to chronic hepatitis C and HCC s/p liver transplantation on 09/15/16 with complicated post-op course. He presents today for G-tube replacement with Mic-Key button.    Labs are WNL. The patient clinically feels well.    - G tube removed and replaced with Mic-Key button  - Return to clinic in 2-3 weeks to evaluate for Mic-Key button removal  - Continue labs weekly  - Medication changes per pharmacy    Procedure:  Dr. Raelene Bott performed the procedure. The G tube was evaluated to be clean and intact. Large bore and small bore ports were noted to the end of the G tube, which was 20 [place] caliber. The small bore port was used to aspirate from the G tube to deflate presumed balloon. Gastric contents were noted to be aspirated. GI was called to ascertain type of G tube placement. GI confirmed no balloon port on the G tube, which was deemed safe for bedside/ external removal. G tube was then successfully pulled with gastrocutaneous tract noted with minimal bleeding. Mic-Key button balloon was inflated, deemed patent, then deflated. Surgical lubricant was applied to the entry component and the Mic-Key button was successfully inserted. 30-cc of sterile water was injected into the stomach with gastric contents then aspirated using a connector port. The Mic-Key button was closed and the area cleaned. The patient tolerated the procedure well.    ______________________________________________________________________    HPI: Jeffrey Ward is a 37M with IDDM, GERD, prior perioperative a-fib with RVR and cirrhosis secondary to chronic hepatitis C and HCC who underwent liver transplantation on 09/15/16.     Post-op course was complicated by decompensation secondary to acute right heart failure,??AKI requiring CRRT (resolved), severe pulmonary hypertension responsive to sildenafil, bradycardia/ asystole requiring pacemaker placement, atrial fibrilliation/ narrow complex tachycardia requiring anti-coagulation with warfarin and amiodarone/digoxin, malnutrition/debility, dysphagia requiring G tube placement, and altered mental status/delirium improved with transition to cyclosporine and psychiatric medications.     He was discharged to [facility] 9/13 and discharged to home 12/10/16. He was readmitted 12/19/16 for increased LFT's and poor PO intake. Transplant liver ultrasound showed patent CBD and non-obstructing debris within the bile duct. His LFT's improved without intervention.     He was most recently admitted from clinic 10/31-11/2 for complaints of vomiting, URI symptoms, fever and acute increase in LFTs suspicious for dehydration, coupled with a picture of lack of medication compliance and uncertainty if he was receiving his IS medications, concern for his wife, his primary caregiver. Work up was negative, he was rehydrated and LFTs normalized.     He was last seen in clinic 1 week ago, at which time he was clinically doing well. All medications are now oral, and he is taking sufficient calories by mouth. He is no longer using his G-tube.     Past Medical History    Past Medical History:   Diagnosis Date   ??? Cancer (CMS-HCC)    ??? Cirrhosis (CMS-HCC)    ??? Diabetes (CMS-HCC)    ??? Hepatitis C 07/17/2012   ??? Liver disease    ??? Low back pain 07/17/2012   ??? Varices, esophageal (CMS-HCC)      Past Surgical History    Past Surgical History:   Procedure Laterality Date   ??? ANKLE SURGERY     ??? BACK SURGERY     ??? BACK SURGERY     ???  CHG US GUIDE, TISSUE ABLATION N/A 01/22/2016    Procedure: ULTRASOUND GUIDANCE FOR, AND MONITORING OF, PARENCHYMAL TISSUE ABLATION;  Surgeon: Particia Nearing, MD;  Location: MAIN OR [facility];  Service: Transplant   ??? IR EMBOLIZATION ORGAN ISCHEMIA, TUMORS, INFAR  06/16/2016    IR EMBOLIZATION ORGAN ISCHEMIA, TUMORS, INFAR 06/16/2016 Ammie Dalton, MD IMG VIR [facility]   ??? PR COLON CA SCRN NOT HI RSK IND N/A 02/27/2015    Procedure: COLOREC CNCR SCR;COLNSCPY NO;  Surgeon: Vonda Antigua, MD;  Location: GI PROC[facility]URES [facility];  Service: Gastroenterology   ??? PR ENDOSCOPIC ULTRASOUND EXAM N/A 02/27/2015    Procedure: UGI ENDO; W/ENDO ULTRASOUND EXAM INCLUDES ESOPHAGUS, STOMACH, &/OR DUODENUM/JEJUNUM;  Surgeon: Vonda Antigua, MD;  Location: GI PROCEDURES MEM[facility];  Service: Gastroenterology   ??? PR ERCP STENT PLACEMENT BILIARY/PANCREATIC DUCT N/A 11/29/2016    Procedure: ENDOSCOPIC RETROGRADE CHOLANGIOPANCREATOGRAPHY (ERCP); WITH PLACEMENT OF ENDOSCOPIC STENT INTO BILIARY OR PANCREATIC DUCT;  Surgeon: Chriss Driver, MD;  Location: GI PROCEDURES [facility]ORIAL [facility];  Service: Gastroenterology   ??? PR ERCP,W/REMOVAL STONE,BIL/PANCR DUCTS N/A 11/29/2016    Procedure: ERCP; W/ENDOSCOPIC RETROGRADE REMOVAL OF CALCULUS/CALCULI FROM BILIARY &/OR PANCREATIC DUCTS;  Surgeon: Chriss Driver, MD;  Location: GI PROCEDURES [facility];  Service: Gastroenterology   ??? PR INSER HEART TEMP PACER ONE CHMBR N/A 10/02/2016    Procedure: Tempoarary Pacemaker Insertion;  Surgeon: Meredith Leeds, MD;  Location: [facility];  Service: Cardiology   ??? PR LAP,DIAGNOSTIC ABDOMEN N/A 01/22/2016    Procedure: Laparoscopy, Abdomen, Peritoneum, & Omentum, Diagnostic, W/Wo Collection Specimen(S) By Brushing Or Washing;  Surgeon: Particia Nearing, MD;  Location: MAIN OR [facility];  Service: Transplant   ??? PR PLACE PERCUT GASTROSTOMY TUBE N/A 11/17/2016    Procedure: UGI ENDO; W/DIRECTED PLCMT PERQ GASTROSTOMY TUBE;  Surgeon: Cletis Athens, MD;  Location: GI PROCEDURES [facility];  Service: Gastroenterology   ??? PR TRACHEOSTOMY, PLANNED N/A 09/29/2016    Procedure: TRACHEOSTOMY PLANNED (SEPART PROC);  Surgeon: Katherina Mires, MD;  Location: MAIN OR [facility];  Service: Trauma   ??? PR TRANSCATH INSERT OR REPLACE LEADLESS PM VENTR N/A 10/11/2016    Procedure: Pacemaker Implant/Replace Leadless;  Surgeon: Meredith Leeds, MD;  Location: [facility];  Service: Cardiology   ??? PR TRANSPLANT LIVER,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    Procedure: Liver Allotransplantation; Orthotopic, Partial Or Whole, From Cadaver Or Living Donor, Any Age;  Surgeon: Doyce Loose, MD;  Location: MAIN OR [facility];  Service: Transplant   ??? PR TRANSPLANT,PREP DONOR LIVER, WHOLE N/A 09/15/2016    Procedure: Rogelia Boga Std Prep Cad Donor Whole Liver Gft Prior Tnsplnt,Inc Chole,Diss/Rem Surr Tissu Wo Triseg/Lobe Splt;  Surgeon: Doyce Loose, MD;  Location: MAIN OR [facility];  Service: Transplant   ??? PR UPPER GI ENDOSCOPY,BIOPSY N/A 07/17/2012    Procedure: UGI ENDOSCOPY; WITH BIOPSY, SINGLE OR MULTIPLE;  Surgeon: Alba Destine, MD;  Location: GI PROCEDURES [facility];  Service: Gastroenterology   ??? PR UPPER GI ENDOSCOPY,DIAGNOSIS N/A 02/04/2014    Procedure: UGI ENDO, INCLUDE ESOPHAGUS, STOMACH, & DUODENUM &/OR JEJUNUM; DX W/WO COLLECTION SPECIMN, BY BRUSH OR WASH;  Surgeon: Wilburt Finlay, MD;  Location: GI PROCEDURES [facility];  Service: Gastroenterology   ??? PR UPPER GI ENDOSCOPY,LIGAT VARIX N/A 11/05/2013    Procedure: UGI ENDO; W/BAND LIG ESOPH &/OR GASTRIC VARICES;  Surgeon: Wilburt Finlay, MD;  Location: [facility];  Service: Gastroenterology     MEDICATIONS:  Current Outpatient Prescriptions  Medication Sig Dispense Refill   ??? acetaminophen (TYLENOL) 160 mg/5 mL solution Take 10.2 mL (325 mg total) by mouth every four (4) hours as needed for pain. 300 mL 0   ??? blood sugar diagnostic (FREESTYLE TEST) Strp by Other route Four (4) times a day (before meals and nightly). 100 each 11   ??? blood-glucose meter (GLUCOSE MONITORING KIT) kit Use as instructed 1 each 0   ??? capsaicin (ZOSTRIX) 0.025 % cream Apply topically Two (2) times a day. 60 g 0   ??? cholecalciferol, vitamin D3, 2,000 unit cap 1 capsule (2,000 Units total) by PEG Tube route daily. 30 capsule 11   ??? digoxin (LANOXIN) 125 mcg tablet 0.5 tablets (62.5 mcg total) by G-tube route daily. 15 tablet 11   ??? docusate (COLACE) 50 mg/5 mL liquid Take 100 mg by mouth two (2) times a day as needed.     ??? insulin ASPART (NOVOLOG FLEXPEN U-100 INSULIN) 100 unit/mL injection pen Inject 0.04 mL (4 Units total) under the skin Three (3) times a day before meals. 4 mL 11   ??? insulin ASPART (NOVOLOG FLEXPEN) 100 unit/mL injection pen Inject 2 Units under the skin Three (3) times a day before meals. per SSI-  151-200= 2 units,  201-250= 4 units, 251-300=add 6 units, 301-350=8 units, 351-400=10 units, >400 =12 units     ??? insulin glargine (LANTUS) 100 unit/mL (3 mL) injection pen Inject 0.12 mL (12 Units total) under the skin nightly. 4 mL 11   ??? lancets Misc 1 each by Miscellaneous route Four (4) times a day (before meals and nightly). 100 each 11   ??? magnesium oxide (MAG-OX) 400 mg (241.3 mg magnesium) tablet Take 1 tablet (400 mg total) by mouth Four (4) times a day. 120 tablet 11   ??? magnesium oxide-Mg AA chelate 133 mg Tab Take 1 tablet by mouth Two (2) times a day. Picking up today 60 tablet 0   ??? melatonin 3 mg Tab Take 3 mg by mouth nightly as needed (1-2 tabs as needed for sleep).     ??? melatonin 5 mg cap Take 1 capsule by mouth nightly as needed.     ??? mycophenolate (CELLCEPT) 250 mg capsule Take 1 capsule (250 mg total) by mouth Two (2) times a day. 240 capsule 2   ??? NEORAL 100 mg capsule Take 2 capsules (200 mg total) by mouth Two (2) times a day. 120 capsule 11   ??? omeprazole (PRILOSEC) 20 MG capsule Take 1 capsule (20 mg total) by mouth daily. 30 capsule 1   ??? pen needle, diabetic (ULTICARE PEN NEEDLE) 32 gauge x 5/32 Ndle 1 each by Miscellaneous route Four (4) times a day (before meals and nightly). 90 each 3   ??? polyethylene glycol (GLYCOLAX) 17 gram/dose powder Take 17 g by mouth daily as needed.     ??? sildenafil, antihypertensive, (REVATIO) 20 mg tablet Take 2 tablets (40 mg total) by mouth Three (3) times a day. 180 tablet 11   ??? sulfamethoxazole-trimethoprim (BACTRIM,SEPTRA) 400-80 mg per tablet Take 1 tablet (80 mg of trimethoprim total) by mouth 3 (three) times a week. 27 tablet 0   ??? thiamine (B-1) 100 MG tablet Take 100 mg by mouth daily.     ??? traMADol (ULTRAM) 50 mg tablet Take 50 mg by mouth. Every 4 to 6 hours PRN     ??? warfarin (COUMADIN) 3 MG tablet Take 1 tablet (3 mg total) by mouth daily with evening meal. 30  tablet 11     No current facility-administered medications for this visit.      ALLERGIES:  Patient has no known allergies.    Review of Systems  A 12 point review of systems was negative except for pertinent items noted in the HPI.     Physical Exam:   Blood pressure 135/61, pulse 71, temperature 36.6 ??C (97.9 ??F), temperature source Tympanic, height 162.2 cm (5' 3.86), weight 63.4 kg (139 lb 11.2 oz).  Body mass index is 24.09 kg/m??.    General - NAD, breathing comfortably  Neuro - alert, oriented, and interactive  Resp - breathing comfortably on room air, clear to auscultation bilaterally  CV - sinus rhythm  Abd - soft, nondistended, nontender; Mercedes incision c/d/i, 45F G-tube in place.  Ext - warm and well perfused, uses walker    LABS:     Lab Results   Component Value Date    WBC 4.7 01/20/2017    HGB 10.0 (L) 01/20/2017    HCT 29.5 (L) 01/20/2017    PLT 151 01/20/2017     Lab Results   Component Value Date    NA 138 01/20/2017    K 3.8 01/20/2017    CL 104 01/20/2017    CO2 28.0 01/20/2017    BUN 22 (H) 01/20/2017    CREATININE 1.09 01/20/2017    CALCIUM 9.3 01/20/2017    MG 1.5 (L) 01/20/2017    PHOS 3.2 01/20/2017     Lab Results   Component Value Date    ALKPHOS 364 (H) 01/20/2017    BILITOT 0.7 01/20/2017    BILIDIR 0.30 01/20/2017    PROT 5.9 (L) 01/20/2017    ALBUMIN 3.2 (L) 01/20/2017    ALT 39 01/20/2017    AST 20 01/20/2017    GGT 403 (H) 01/20/2017       Imaging:   None

## 2017-01-24 ENCOUNTER — Ambulatory Visit
Admission: RE | Admit: 2017-01-24 | Discharge: 2017-01-24 | Disposition: A | Discharge status: Home / Self Care | Payer: MEDICARE

## 2017-01-24 DIAGNOSIS — Z8505 Personal history of malignant neoplasm of liver: Secondary | ICD-10-CM

## 2017-01-24 DIAGNOSIS — K219 Gastro-esophageal reflux disease without esophagitis: Secondary | ICD-10-CM

## 2017-01-24 DIAGNOSIS — Z8619 Personal history of other infectious and parasitic diseases: Secondary | ICD-10-CM

## 2017-01-24 DIAGNOSIS — Z794 Long term (current) use of insulin: Secondary | ICD-10-CM

## 2017-01-24 DIAGNOSIS — E1142 Type 2 diabetes mellitus with diabetic polyneuropathy: Secondary | ICD-10-CM

## 2017-01-24 DIAGNOSIS — E46 Unspecified protein-calorie malnutrition: Principal | ICD-10-CM

## 2017-01-24 DIAGNOSIS — Z6821 Body mass index (BMI) 21.0-21.9, adult: Secondary | ICD-10-CM

## 2017-01-24 DIAGNOSIS — Z7982 Long term (current) use of aspirin: Secondary | ICD-10-CM

## 2017-01-24 DIAGNOSIS — N400 Enlarged prostate without lower urinary tract symptoms: Secondary | ICD-10-CM

## 2017-01-24 DIAGNOSIS — I4891 Unspecified atrial fibrillation: Secondary | ICD-10-CM

## 2017-01-24 DIAGNOSIS — I272 Pulmonary hypertension, unspecified: Secondary | ICD-10-CM

## 2017-01-24 DIAGNOSIS — Z4823 Encounter for aftercare following liver transplant: Principal | ICD-10-CM

## 2017-01-24 DIAGNOSIS — G894 Chronic pain syndrome: Secondary | ICD-10-CM

## 2017-01-24 DIAGNOSIS — Z431 Encounter for attention to gastrostomy: Secondary | ICD-10-CM

## 2017-01-24 DIAGNOSIS — D649 Anemia, unspecified: Secondary | ICD-10-CM

## 2017-01-24 DIAGNOSIS — Z950 Presence of cardiac pacemaker: Secondary | ICD-10-CM

## 2017-01-24 DIAGNOSIS — Z9181 History of falling: Secondary | ICD-10-CM

## 2017-01-24 DIAGNOSIS — M961 Postlaminectomy syndrome, not elsewhere classified: Secondary | ICD-10-CM

## 2017-01-24 LAB — GAMMA GLUTAMYL TRANSFERASE: Gamma glutamyl transferase:CCnc:Pt:Ser/Plas:Qn:: 642 — ABNORMAL HIGH

## 2017-01-24 LAB — COMPREHENSIVE METABOLIC PANEL
ALBUMIN: 3.3 g/dL — ABNORMAL LOW (ref 3.5–5.0)
ALKALINE PHOSPHATASE: 703 U/L — ABNORMAL HIGH (ref 38–126)
ALT (SGPT): 82 U/L — ABNORMAL HIGH (ref 19–72)
ANION GAP: 8 mmol/L — ABNORMAL LOW (ref 9–15)
AST (SGOT): 56 U/L — ABNORMAL HIGH (ref 19–55)
BILIRUBIN TOTAL: 0.9 mg/dL (ref 0.0–1.2)
BLOOD UREA NITROGEN: 25 mg/dL — ABNORMAL HIGH (ref 7–21)
BUN / CREAT RATIO: 19
CALCIUM: 9.2 mg/dL (ref 8.5–10.2)
CHLORIDE: 104 mmol/L (ref 98–107)
CO2: 26 mmol/L (ref 22.0–30.0)
EGFR MDRD AF AMER: >=60 mL/min/{1.73_m2} (ref >=60)
EGFR MDRD NON AF AMER: 53 mL/min/{1.73_m2} — ABNORMAL LOW (ref >=60)
GLUCOSE RANDOM: 74 mg/dL (ref 65–179)
POTASSIUM: 4.1 mmol/L (ref 3.5–5.0)
PROTEIN TOTAL: 6.1 g/dL — ABNORMAL LOW (ref 6.5–8.3)
SODIUM: 138 mmol/L (ref 135–145)

## 2017-01-24 LAB — BILIRUBIN DIRECT: Bilirubin.glucuronidated:MCnc:Pt:Ser/Plas:Qn:: 0.5 — ABNORMAL HIGH

## 2017-01-24 LAB — CBC W/ AUTO DIFF
BASOPHILS ABSOLUTE COUNT: 0 10*9/L (ref 0.0–0.1)
EOSINOPHILS ABSOLUTE COUNT: 0.1 10*9/L (ref 0.0–0.4)
HEMATOCRIT: 29 % — ABNORMAL LOW (ref 41.0–53.0)
HEMOGLOBIN: 10.1 g/dL — ABNORMAL LOW (ref 13.5–17.5)
LARGE UNSTAINED CELLS: 3 % (ref 0–4)
LYMPHOCYTES ABSOLUTE COUNT: 0.9 10*9/L — ABNORMAL LOW (ref 1.5–5.0)
MEAN CORPUSCULAR HEMOGLOBIN CONC: 34.9 g/dL (ref 31.0–37.0)
MEAN CORPUSCULAR HEMOGLOBIN: 32.6 pg (ref 26.0–34.0)
MEAN CORPUSCULAR VOLUME: 93.4 fL (ref 80.0–100.0)
MONOCYTES ABSOLUTE COUNT: 0.2 10*9/L (ref 0.2–0.8)
NEUTROPHILS ABSOLUTE COUNT: 2.7 10*9/L (ref 2.0–7.5)
PLATELET COUNT: 141 10*9/L — ABNORMAL LOW (ref 150–440)
RED BLOOD CELL COUNT: 3.1 10*12/L — ABNORMAL LOW (ref 4.50–5.90)
RED CELL DISTRIBUTION WIDTH: 17.2 % — ABNORMAL HIGH (ref 12.0–15.0)

## 2017-01-24 LAB — PROTIME: Lab: 42.3 — ABNORMAL HIGH

## 2017-01-24 LAB — MAGNESIUM: Magnesium:MCnc:Pt:Ser/Plas:Qn:: 1.7

## 2017-01-24 LAB — PHOSPHORUS: Phosphate:MCnc:Pt:Ser/Plas:Qn:: 3.7

## 2017-01-24 LAB — ANION GAP: Anion gap 3:SCnc:Pt:Ser/Plas:Qn:: 8 — ABNORMAL LOW

## 2017-01-24 LAB — VARIABLE HEMOGLOBIN CONCENTRATION

## 2017-01-24 LAB — PROTIME-INR: INR: 3.71

## 2017-01-24 MED ORDER — WARFARIN 3 MG TABLET
ORAL_TABLET | Freq: Every day | ORAL | 11 refills | 0 days
Start: 2017-01-24 — End: 2017-01-27

## 2017-01-24 NOTE — Unmapped (Signed)
Alk Phos and GGT elevated.  Additionally INR outside of goal at 3.7.  Spoke with wife she denied any bleeding or pain at [facility] button site.  Patient reports having formed normal appearing BM no no evidence of bleeding or being black or tarry.  No issues with jaundice or generalized itching.  Patient overall feels well with increase in appetite and able to drink fluids.  Paged Dr. Raelene Bott on-call transplant surgeon to decide what to do about abnormal labs.      Stated to HOLD tonight's dose of coumadin and repeat labs tomorrow to see if LFTs are still elevated.  Also mentioned the need for a note for MRI to be scheduled for patient since he has a pacemaker.  Stated he would review it and decide after discussing with colleagues.     Home health nurse case manager agreed to get repeat labs on patient tomorrow.  Called wife and she verbalized understanding too instructions.

## 2017-01-24 NOTE — Unmapped (Signed)
Spent a total of 45 minutes in educating patient about 3 month post transplant education booklet.  Copy provided.  Patient complaining of sleep problems.  Patient with a 68 year old mattress and tries to sleep from 7pm to 10am.  Educated about sleep hygiene including a comfortable sleep environment, pushing his bedtime back to latter at night, decreasing fluid intake at night (but still drinking a full 8 ounces with 2100 medications,.  Provided additional nursing assistance while patient G-tube was exchanged for [employer].

## 2017-01-25 ENCOUNTER — Ambulatory Visit: Admission: RE | Admit: 2017-01-25 | Discharge: 2017-01-25 | Disposition: A | Discharge status: Home / Self Care

## 2017-01-25 DIAGNOSIS — R7989 Other specified abnormal findings of blood chemistry: Principal | ICD-10-CM

## 2017-01-25 DIAGNOSIS — N400 Enlarged prostate without lower urinary tract symptoms: Secondary | ICD-10-CM

## 2017-01-25 DIAGNOSIS — E46 Unspecified protein-calorie malnutrition: Secondary | ICD-10-CM

## 2017-01-25 DIAGNOSIS — I272 Pulmonary hypertension, unspecified: Secondary | ICD-10-CM

## 2017-01-25 DIAGNOSIS — D649 Anemia, unspecified: Secondary | ICD-10-CM

## 2017-01-25 DIAGNOSIS — Z8505 Personal history of malignant neoplasm of liver: Secondary | ICD-10-CM

## 2017-01-25 DIAGNOSIS — K219 Gastro-esophageal reflux disease without esophagitis: Secondary | ICD-10-CM

## 2017-01-25 DIAGNOSIS — Z950 Presence of cardiac pacemaker: Secondary | ICD-10-CM

## 2017-01-25 DIAGNOSIS — M961 Postlaminectomy syndrome, not elsewhere classified: Secondary | ICD-10-CM

## 2017-01-25 DIAGNOSIS — E1142 Type 2 diabetes mellitus with diabetic polyneuropathy: Secondary | ICD-10-CM

## 2017-01-25 DIAGNOSIS — Z4823 Encounter for aftercare following liver transplant: Principal | ICD-10-CM

## 2017-01-25 DIAGNOSIS — Z794 Long term (current) use of insulin: Secondary | ICD-10-CM

## 2017-01-25 DIAGNOSIS — Z8619 Personal history of other infectious and parasitic diseases: Secondary | ICD-10-CM

## 2017-01-25 DIAGNOSIS — Z431 Encounter for attention to gastrostomy: Secondary | ICD-10-CM

## 2017-01-25 DIAGNOSIS — I4891 Unspecified atrial fibrillation: Secondary | ICD-10-CM

## 2017-01-25 DIAGNOSIS — E039 Hypothyroidism, unspecified: Secondary | ICD-10-CM

## 2017-01-25 DIAGNOSIS — G894 Chronic pain syndrome: Secondary | ICD-10-CM

## 2017-01-25 DIAGNOSIS — Z6821 Body mass index (BMI) 21.0-21.9, adult: Secondary | ICD-10-CM

## 2017-01-25 DIAGNOSIS — Z9181 History of falling: Secondary | ICD-10-CM

## 2017-01-25 DIAGNOSIS — Z7982 Long term (current) use of aspirin: Secondary | ICD-10-CM

## 2017-01-25 LAB — CYCLOSPORINE, TROUGH
CYCLOSPORINE, TROUGH: 226 ng/mL (ref <=800)
Lab: 216
Lab: 226

## 2017-01-25 LAB — CBC W/ AUTO DIFF
BASOPHILS ABSOLUTE COUNT: 0 10*9/L (ref 0.0–0.1)
EOSINOPHILS ABSOLUTE COUNT: 0.1 10*9/L (ref 0.0–0.4)
HEMATOCRIT: 29.1 % — ABNORMAL LOW (ref 41.0–53.0)
HEMOGLOBIN: 9.7 g/dL — ABNORMAL LOW (ref 13.5–17.5)
LARGE UNSTAINED CELLS: 2 % (ref 0–4)
MEAN CORPUSCULAR HEMOGLOBIN CONC: 33.5 g/dL (ref 31.0–37.0)
MEAN CORPUSCULAR HEMOGLOBIN: 31.3 pg (ref 26.0–34.0)
MEAN CORPUSCULAR VOLUME: 93.5 fL (ref 80.0–100.0)
MEAN PLATELET VOLUME: 8.3 fL (ref 7.0–10.0)
NEUTROPHILS ABSOLUTE COUNT: 3.1 10*9/L (ref 2.0–7.5)
PLATELET COUNT: 141 10*9/L — ABNORMAL LOW (ref 150–440)
RED BLOOD CELL COUNT: 3.11 10*12/L — ABNORMAL LOW (ref 4.50–5.90)
RED CELL DISTRIBUTION WIDTH: 17 % — ABNORMAL HIGH (ref 12.0–15.0)
WBC ADJUSTED: 4.3 10*9/L — ABNORMAL LOW (ref 4.5–11.0)

## 2017-01-25 LAB — COMPREHENSIVE METABOLIC PANEL
ALKALINE PHOSPHATASE: 696 U/L — ABNORMAL HIGH (ref 38–126)
ALT (SGPT): 78 U/L — ABNORMAL HIGH (ref 19–72)
ANION GAP: 7 mmol/L — ABNORMAL LOW (ref 9–15)
AST (SGOT): 74 U/L — ABNORMAL HIGH (ref 19–55)
BILIRUBIN TOTAL: 0.8 mg/dL (ref 0.0–1.2)
BLOOD UREA NITROGEN: 26 mg/dL — ABNORMAL HIGH (ref 7–21)
BUN / CREAT RATIO: 24
CALCIUM: 9.3 mg/dL (ref 8.5–10.2)
CHLORIDE: 105 mmol/L (ref 98–107)
CO2: 26 mmol/L (ref 22.0–30.0)
CREATININE: 1.1 mg/dL (ref 0.70–1.30)
EGFR MDRD AF AMER: >=60 mL/min/{1.73_m2} (ref >=60)
EGFR MDRD NON AF AMER: >=60 mL/min/{1.73_m2} (ref >=60)
GLUCOSE RANDOM: 94 mg/dL (ref 65–179)
POTASSIUM: 4.2 mmol/L (ref 3.5–5.0)
PROTEIN TOTAL: 5.9 g/dL — ABNORMAL LOW (ref 6.5–8.3)
SODIUM: 138 mmol/L (ref 135–145)

## 2017-01-25 LAB — PHOSPHORUS: Phosphate:MCnc:Pt:Ser/Plas:Qn:: 3.2

## 2017-01-25 LAB — SODIUM: Sodium:SCnc:Pt:Ser/Plas:Qn:: 138

## 2017-01-25 LAB — BILIRUBIN DIRECT: Bilirubin.glucuronidated:MCnc:Pt:Ser/Plas:Qn:: 0.5 — ABNORMAL HIGH

## 2017-01-25 LAB — MAGNESIUM
MAGNESIUM: 1.7 mg/dL (ref 1.6–2.2)
Magnesium:MCnc:Pt:Ser/Plas:Qn:: 1.7

## 2017-01-25 LAB — INR: Lab: 3.75

## 2017-01-25 LAB — GAMMA GLUTAMYL TRANSFERASE: Gamma glutamyl transferase:CCnc:Pt:Ser/Plas:Qn:: 646 — ABNORMAL HIGH

## 2017-01-25 LAB — RED BLOOD CELL COUNT: Lab: 3.11 — ABNORMAL LOW

## 2017-01-25 MED ORDER — MAGNESIUM OXIDE 400 MG (241.3 MG MAGNESIUM) TABLET
ORAL_TABLET | Freq: Two times a day (BID) | ORAL | 11 refills | 0 days
Start: 2017-01-25 — End: 2017-02-06

## 2017-01-25 MED ORDER — INSULIN ASPART (U-100) 100 UNIT/ML (3 ML) SUBCUTANEOUS PEN
Freq: Three times a day (TID) | SUBCUTANEOUS | 11 refills | 0 days
Start: 2017-01-25 — End: 2017-02-06

## 2017-01-26 ENCOUNTER — Ambulatory Visit
Admission: RE | Admit: 2017-01-26 | Discharge: 2017-01-26 | Disposition: A | Discharge status: Home / Self Care | Payer: MEDICARE

## 2017-01-26 DIAGNOSIS — G894 Chronic pain syndrome: Secondary | ICD-10-CM

## 2017-01-26 DIAGNOSIS — Z950 Presence of cardiac pacemaker: Secondary | ICD-10-CM

## 2017-01-26 DIAGNOSIS — N400 Enlarged prostate without lower urinary tract symptoms: Secondary | ICD-10-CM

## 2017-01-26 DIAGNOSIS — I272 Pulmonary hypertension, unspecified: Secondary | ICD-10-CM

## 2017-01-26 DIAGNOSIS — Z6821 Body mass index (BMI) 21.0-21.9, adult: Secondary | ICD-10-CM

## 2017-01-26 DIAGNOSIS — Z794 Long term (current) use of insulin: Secondary | ICD-10-CM

## 2017-01-26 DIAGNOSIS — Z431 Encounter for attention to gastrostomy: Secondary | ICD-10-CM

## 2017-01-26 DIAGNOSIS — Z8505 Personal history of malignant neoplasm of liver: Secondary | ICD-10-CM

## 2017-01-26 DIAGNOSIS — I4891 Unspecified atrial fibrillation: Secondary | ICD-10-CM

## 2017-01-26 DIAGNOSIS — Z4823 Encounter for aftercare following liver transplant: Principal | ICD-10-CM

## 2017-01-26 DIAGNOSIS — K219 Gastro-esophageal reflux disease without esophagitis: Secondary | ICD-10-CM

## 2017-01-26 DIAGNOSIS — E46 Unspecified protein-calorie malnutrition: Secondary | ICD-10-CM

## 2017-01-26 DIAGNOSIS — Z9181 History of falling: Secondary | ICD-10-CM

## 2017-01-26 DIAGNOSIS — D649 Anemia, unspecified: Secondary | ICD-10-CM

## 2017-01-26 DIAGNOSIS — Z7982 Long term (current) use of aspirin: Secondary | ICD-10-CM

## 2017-01-26 DIAGNOSIS — M961 Postlaminectomy syndrome, not elsewhere classified: Secondary | ICD-10-CM

## 2017-01-26 DIAGNOSIS — E1142 Type 2 diabetes mellitus with diabetic polyneuropathy: Secondary | ICD-10-CM

## 2017-01-26 DIAGNOSIS — I97131 Postprocedural heart failure following other surgery: Principal | ICD-10-CM

## 2017-01-26 DIAGNOSIS — Z8619 Personal history of other infectious and parasitic diseases: Secondary | ICD-10-CM

## 2017-01-26 DIAGNOSIS — N990 Postprocedural (acute) (chronic) kidney failure: Secondary | ICD-10-CM

## 2017-01-26 LAB — COMPREHENSIVE METABOLIC PANEL
ALBUMIN: 3 g/dL — ABNORMAL LOW (ref 3.5–5.0)
ALKALINE PHOSPHATASE: 594 U/L — ABNORMAL HIGH (ref 38–126)
ALT (SGPT): 63 U/L (ref 19–72)
ANION GAP: 5 mmol/L — ABNORMAL LOW (ref 9–15)
AST (SGOT): 36 U/L (ref 19–55)
BLOOD UREA NITROGEN: 24 mg/dL — ABNORMAL HIGH (ref 7–21)
BUN / CREAT RATIO: 20
CALCIUM: 9.1 mg/dL (ref 8.5–10.2)
CHLORIDE: 105 mmol/L (ref 98–107)
CO2: 27 mmol/L (ref 22.0–30.0)
CREATININE: 1.18 mg/dL (ref 0.70–1.30)
EGFR MDRD AF AMER: >=60 mL/min/{1.73_m2} (ref >=60)
GLUCOSE RANDOM: 98 mg/dL (ref 65–179)
POTASSIUM: 4.1 mmol/L (ref 3.5–5.0)
PROTEIN TOTAL: 5.7 g/dL — ABNORMAL LOW (ref 6.5–8.3)
SODIUM: 137 mmol/L (ref 135–145)

## 2017-01-26 LAB — CBC W/ AUTO DIFF
BASOPHILS ABSOLUTE COUNT: 0 10*9/L (ref 0.0–0.1)
EOSINOPHILS ABSOLUTE COUNT: 0 10*9/L (ref 0.0–0.4)
HEMATOCRIT: 28 % — ABNORMAL LOW (ref 41.0–53.0)
HEMOGLOBIN: 9.5 g/dL — ABNORMAL LOW (ref 13.5–17.5)
LARGE UNSTAINED CELLS: 2 % (ref 0–4)
LYMPHOCYTES ABSOLUTE COUNT: 0.8 10*9/L — ABNORMAL LOW (ref 1.5–5.0)
MEAN CORPUSCULAR HEMOGLOBIN CONC: 34 g/dL (ref 31.0–37.0)
MEAN CORPUSCULAR HEMOGLOBIN: 31.9 pg (ref 26.0–34.0)
MEAN PLATELET VOLUME: 8.5 fL (ref 7.0–10.0)
MONOCYTES ABSOLUTE COUNT: 0.2 10*9/L (ref 0.2–0.8)
NEUTROPHILS ABSOLUTE COUNT: 2.3 10*9/L (ref 2.0–7.5)
PLATELET COUNT: 140 10*9/L — ABNORMAL LOW (ref 150–440)
RED BLOOD CELL COUNT: 2.99 10*12/L — ABNORMAL LOW (ref 4.50–5.90)
RED CELL DISTRIBUTION WIDTH: 17.1 % — ABNORMAL HIGH (ref 12.0–15.0)
WBC ADJUSTED: 3.5 10*9/L — ABNORMAL LOW (ref 4.5–11.0)

## 2017-01-26 LAB — PHOSPHORUS: Phosphate:MCnc:Pt:Ser/Plas:Qn:: 2.8 — ABNORMAL LOW

## 2017-01-26 LAB — MAGNESIUM: Magnesium:MCnc:Pt:Ser/Plas:Qn:: 1.8

## 2017-01-26 LAB — PROTIME: Lab: 33.9 — ABNORMAL HIGH

## 2017-01-26 LAB — GAMMA GLUTAMYL TRANSFERASE: Gamma glutamyl transferase:CCnc:Pt:Ser/Plas:Qn:: 569 — ABNORMAL HIGH

## 2017-01-26 LAB — BILIRUBIN DIRECT: Bilirubin.glucuronidated:MCnc:Pt:Ser/Plas:Qn:: 0.4

## 2017-01-26 LAB — EGFR MDRD NON AF AMER: Glomerular filtration rate/1.73 sq M.predicted.non black:ArVRat:Pt:Ser/Plas/Bld:Qn:Creatinine-based formula (MDRD): >=60

## 2017-01-26 LAB — EOSINOPHILS ABSOLUTE COUNT: Lab: 0

## 2017-01-26 NOTE — Unmapped (Signed)
Patient has increase BP yesterday was a BP 153/79 and today BP 164/76.  Patient feeling much better and appetite is increased.  Wife confirmed he has the capsicin cream and it has helped with his pain.

## 2017-01-26 NOTE — Unmapped (Signed)
Per Dr. Matilde Haymaker patient LFTs slightly improved repeat labs needed on Thursday.  Wife verbalized understanding.

## 2017-01-26 NOTE — Unmapped (Signed)
[facility] PHARMACY NOTE  01/25/2017   Jeffrey Ward  [id number]    Medication changes today:   1. Decrease mag ox to 400 mg bid  2. Added MG Plus Protein BID  3. Increase melatonin to 5 mg HS  4. Increase novolog to 5 units ACTID    Dducation/Adherence tools provided today:  1. provided updated medication list   2. Provided assistance with pill box fill   3. Reviewed appropriate monitoring of blood sugars and education on insulin regimen  4. Provided education on immunosuppression side effects and dosing regimen    Follow up items:  1. Continue to assess wife's ability to fill pill box   2. Follow-up with tolerability of oral medications  3. Assess BG log for 2 weeks with new instructions to check TID before meals   4. Stop vitamin b next visit  5. Consolidate timing of medication administration. Did not change this visit because pill box was mostly filled and did not want to further confuse the patient.     Next visit with pharmacy in 2 weeks   ____________________________________________________________________    Jeffrey Ward is a 66 y.o. 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 Course was complicated by cardiogenic shock due to right heart failure, a fib/atrial flutter, pulmonary hypertension, AKI requiring CRRT (now resolved), s/p permanent pacemaker placement, PEG placement 11/17/16 for dysphagia.     Concern for rejection on 8/18, found to have mild-moderate intrahepatic biliary duct dilation. No acute rejection on liver biopsy on 11/04/16, but did receive steroids at this time for presumed rejection.     Other PMH significant for diabetes     Seen by pharmacy today for: medication management and pill box fill and adherence; last seen by pharmacy 1 week ago  CC:  Patient complains of back pain, tingling in legs (particularly left leg)    Vitals:    01/23/17 1311   BP: 135/61   Pulse: 71   Temp: 36.6 ??C (97.9 ??F)       No Known Allergies  All medications reviewed and updated.   Medication list includes revisions made during today???s encounter    Outpatient Encounter Prescriptions as of 01/23/2017   Medication Sig Dispense Refill   ??? acetaminophen (TYLENOL) 160 mg/5 mL solution Take 10.2 mL (325 mg total) by mouth every four (4) hours as needed for pain. 300 mL 0   ??? blood sugar diagnostic (FREESTYLE TEST) Strp by Other route Four (4) times a day (before meals and nightly). 100 each 11   ??? blood-glucose meter (GLUCOSE MONITORING KIT) kit Use as instructed 1 each 0   ??? capsaicin (ZOSTRIX) 0.025 % cream Apply topically Two (2) times a day. 60 g 0   ??? cholecalciferol, vitamin D3, 2,000 unit cap 1 capsule (2,000 Units total) by PEG Tube route daily. 30 capsule 11   ??? digoxin (LANOXIN) 125 mcg tablet 0.5 tablets (62.5 mcg total) by G-tube route daily. 15 tablet 11   ??? docusate (COLACE) 50 mg/5 mL liquid Take 100 mg by mouth two (2) times a day as needed.     ??? insulin ASPART (NOVOLOG FLEXPEN U-100 INSULIN) 100 unit/mL injection pen Inject 0.05 mL (5 Units total) under the skin Three (3) times a day before meals. 4 mL 11   ??? insulin ASPART (NOVOLOG FLEXPEN) 100 unit/mL injection pen Inject 2 Units under the skin Three (3) times a day before meals. per SSI-  151-200= 2 units,  201-250=  4 units, 251-300=add 6 units, 301-350=8 units, 351-400=10 units, >400 =12 units     ??? insulin glargine (LANTUS) 100 unit/mL (3 mL) injection pen Inject 0.12 mL (12 Units total) under the skin nightly. 4 mL 11   ??? lancets Misc 1 each by Miscellaneous route Four (4) times a day (before meals and nightly). 100 each 11   ??? magnesium oxide (MAG-OX) 400 mg (241.3 mg magnesium) tablet Take 1 tablet (400 mg total) by mouth Two (2) times a day. 120 tablet 11   ??? magnesium oxide-Mg AA chelate 133 mg Tab Take 1 tablet by mouth Two (2) times a day. Picking up today 60 tablet 0   ??? melatonin 5 mg cap Take 1 capsule by mouth nightly as needed.     ??? mycophenolate (CELLCEPT) 250 mg capsule Take 1 capsule (250 mg total) by mouth Two (2) times a day. 240 capsule 2   ??? NEORAL 100 mg capsule Take 2 capsules (200 mg total) by mouth Two (2) times a day. 120 capsule 11   ??? omeprazole (PRILOSEC) 20 MG capsule Take 1 capsule (20 mg total) by mouth daily. 30 capsule 1   ??? pen needle, diabetic (ULTICARE PEN NEEDLE) 32 gauge x 5/32 Ndle 1 each by Miscellaneous route Four (4) times a day (before meals and nightly). 90 each 3   ??? polyethylene glycol (GLYCOLAX) 17 gram/dose powder Take 17 g by mouth daily as needed.     ??? sildenafil, antihypertensive, (REVATIO) 20 mg tablet Take 2 tablets (40 mg total) by mouth Three (3) times a day. 180 tablet 11   ??? sulfamethoxazole-trimethoprim (BACTRIM,SEPTRA) 400-80 mg per tablet Take 1 tablet (80 mg of trimethoprim total) by mouth 3 (three) times a week. 27 tablet 0   ??? thiamine (B-1) 100 MG tablet Take 100 mg by mouth daily.     ??? traMADol (ULTRAM) 50 mg tablet Take 50 mg by mouth. Every 4 to 6 hours PRN     ??? [DISCONTINUED] insulin ASPART (NOVOLOG FLEXPEN U-100 INSULIN) 100 unit/mL injection pen Inject 0.04 mL (4 Units total) under the skin Three (3) times a day before meals. 4 mL 11   ??? [DISCONTINUED] magnesium oxide (MAG-OX) 400 mg (241.3 mg magnesium) tablet Take 1 tablet (400 mg total) by mouth Four (4) times a day. 120 tablet 11   ??? [DISCONTINUED] melatonin 3 mg Tab Take 3 mg by mouth nightly as needed (1-2 tabs as needed for sleep).     ??? [DISCONTINUED] warfarin (COUMADIN) 3 MG tablet Take 1 tablet (3 mg total) by mouth daily with evening meal. 30 tablet 11     No facility-administered encounter medications on file as of 01/23/2017.      Induction agent : steroids only     CURRENT IMMUNOSUPPRESSION: cyclosporine 200 mg PO BID  prograf/cyclosporine goal: 150 - 200  Cellcept 250 mg PO BID   Steroid free     Patient complaining of back pain and tingling in legs; previously had complaints of nausea/vomiting and diarrhea that have resolved   Pt is tolerating oral immunosuppression well IMMUNOSUPPRESSION DRUG LEVELS:  Lab Results   Component Value Date    TACROLIMUS 10.6 10/21/2016    TACROLIMUS 11.3 10/20/2016    TACROLIMUS 10.6 10/19/2016     Lab Results   Component Value Date    CYCLO 216 01/25/2017    CYCLO 226 01/24/2017    CYCLO 165 01/20/2017     No results found for: EVEROLIMUS  No results found  for: SIROLIMUS    CSA level is accurate 12 hour trough     Graft function:improving   DSA:ntd   Biopsies to date: 11/04/16 - no rejection indicated   WBC/ANC: wnl    Plan: continue to monitor     ID prophylaxis:   CMV Status: D+/R+, moderate risk CMV prophylaxis: valganciclovir 450mg  daily x 3 months per protocol. Completed  No results found for: CMVCP  PCP Prophylaxis: bactrim SS 1 tab MWF x 6 months. Stop date 03/18/17   Thrush: completed   Patient is tolerating infectious prophylaxis well, tolerated bactrim tablets  Plan: continue bactrim per protocol     CV Prophylaxis: asa 81mg  D/C'd due to warfarin use   The 10-year ASCVD risk score Denman George DC Jr., et al., 2013) is: 27.1%  Statin therapy: indicated, consider initiating statin at future visit. Patient indicated for high-intensity statin but wife is still working on managing current medications. Consider starting once adherence to current regimen has been attained.     BP: Goal < 140/90. Clinic vitals reported above  Home BP ranges: 106-158/57-88  Current meds include: none at this time   Plan: within goal. Continue to monitor    Atrial fibrillations:  Medications currently on: digoxin 62.5 mcg daily and warfarin 3mg  qHS  Digoxin level: 0.8 (10/31)  INR: 2.95, goal 2-3   TSH: 8.26, Free T4: 1.98   Plan: continue warfarin 3mg  qHS, follow up with INR since it is trending up.   Continue to monitor serum K and Mg and replace up to 4.0 and 2.0, respectively, to reduce risk of arrythmia     Pulmonary Hypertension:  Meds currently on: sildenafil 40mg  TID   Plan: continue current sildenafil dose.     Anemia:  H/H:   Lab Results   Component Value Date    HGB 9.7 (L) 01/25/2017     Lab Results   Component Value Date    HCT 29.1 (L) 01/25/2017     Iron panel:  Lab Results   Component Value Date    IRON 169 (H) 11/24/2015    TIBC 190.8 (L) 11/24/2015    FERRITIN 286.0 11/24/2015     Lab Results   Component Value Date    LABIRON 89 (H) 11/24/2015     Prior ESA use: NTD  Plan: within goal. Continue to monitor.     DM:   Lab Results   Component Value Date    A1C 5.1 12/26/2016   . Goal A1c < 7  History of Dm? 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 Pt has a primary care that he saw a few days ago however at this time, however he is not primarily managing the BS at this time.  Currently on: Lantus 12 units qHS, Novolog 4 units with meals + sliding scale insulin   Home BS log:  Diet: has been eating more  Hypoglycemia:No    Plan: Increase novolog to 5 units AC TID    Electrolytes: mag low  Meds currently on: Mag oxide 400mg  QID and instructed to start mag plus protein, but he did not pick this up at the pharmacy.   Plan: mag level is improved today despite not picking up the mag plus protein. Will transition to mag plus protein d/t soft and frequent BM, intermittent diarrhea.   Change mag ox to 9A and PM and start mag plus protein to 1p and 5p.     BM: Patient having 1  BM daily. Formed but small. No reports of nausea/vomiting.   Meds currently on: Miralax PRN   Plan: Encouraged consistent use.  Continue to monitor     Pain:Patient complaining of lower back pain which he treats with tylenol. Pt states this is a chronic pain that he has had pre txp.  Wife has not given him tramadol but will begin to do so. Also complains of tingling in legs   Meds currently on: Acetaminophen 325 mg PRN, tramadol 50mg  PRN, gapapentin started last week. Pt did not tolerate, after 2 days, he became extremely weak and fell twice. He stopped taking this medication and now feels better again  Plan: Continue to monitor pain as is, start capsaicin cream per endocrine note.    Bone health:   Vitamin D Level: last level 34.2. Goal > 30.   Last DEXA results: 11/30/15, scan indicated low bone density   Current meds include: cholecalciferol 2,000 units daily   Plan: Vitamin D level within goal, continue supplementation. Continue to monitor.     Insomnia:   Meds currently on: melatonin 3 mg per night. Patient continues to report getting up frequently at night. Counseled patient to try to avoid drinking water after 5PM and see if that aids with nighttime bathroom trips.   Plan: order PSA and  Increase melatonin to 5 mg HS     Women's/Men's Health:  Kieran Arreguin is a 66 y.o. male.  Patient reports reports no men's health issues   Plan: continue to monitor     Adherence: Patient hasp oor  understanding of medications; was not able to independently identify names/doses of immunosuppressants and OI meds. Patient's wife primarily manages medications. She has an improved understanding of his medications but continues to need assistance.   Patient  Does not fill their own pill box on a regular basis at home. Wife brought pill box, demonstrated a fill that was correct.   Patient brought medication card:yes  Pill box:was correct   Patient requested refills for the following meds: None  Corrections needed in Epic medication list: none  Plan: Wife continues to be overwhelmed with the medications but has improved. Continue to check pill box fills and wife's understanding of medications; provided moderate adherence counseling/intervention    Spent approximately 40 minutes on educating this patient and greater than 50% was spent in direct face to face counseling regarding post transplant medication education. Questions and concerns were address to patient's satisfaction.    Patient was reviewed with Dr. Rush Barer who was agreement with the stated plan:     During this visit, the following was completed:   BG log data assessment  BP log data assessment  Labs ordered and evaluated  Complex treatment plan > 1DS   Patient education was completed for 11-24 minutes     All questions/concerns were addressed to the patient's satisfaction.  __________________________________________  Noah Charon, PHARMD, CPP  [employer] [facility]  PAGER [phone]

## 2017-01-27 LAB — CYCLOSPORINE, TROUGH: Lab: 196

## 2017-01-27 MED ORDER — WARFARIN 1 MG TABLET: tablet | 11 refills | 0 days | Status: AC

## 2017-01-27 MED ORDER — WARFARIN 1 MG TABLET
ORAL_TABLET | 11 refills | 0.00000 days | Status: CP
Start: 2017-01-27 — End: 2017-01-27

## 2017-01-27 NOTE — Unmapped (Signed)
Sent fax with medication order for coumadin to local pharmacy.

## 2017-01-27 NOTE — Unmapped (Signed)
Per PharmD Nedra Hai patient to change his Coumadin dose to 3 mg Tuesday, Thursday, Saturday, Sunday and 2 mg Monday, Wednesday, Friday.  Wife verbalized understanding and agreed to come to appoinmnets scheduled on 02/06/17 and to get labs with home health Tuesday or Monday next week.

## 2017-01-30 ENCOUNTER — Ambulatory Visit
Admission: RE | Admit: 2017-01-30 | Discharge: 2017-01-30 | Disposition: A | Discharge status: Home / Self Care | Payer: MEDICARE | Attending: Audiologist

## 2017-01-30 ENCOUNTER — Ambulatory Visit
Admission: RE | Admit: 2017-01-30 | Discharge: 2017-01-30 | Payer: MEDICARE | Attending: Student in an Organized Health Care Education/Training Program | Admitting: Student in an Organized Health Care Education/Training Program

## 2017-01-30 DIAGNOSIS — K219 Gastro-esophageal reflux disease without esophagitis: Secondary | ICD-10-CM

## 2017-01-30 DIAGNOSIS — Z8505 Personal history of malignant neoplasm of liver: Secondary | ICD-10-CM

## 2017-01-30 DIAGNOSIS — R490 Dysphonia: Principal | ICD-10-CM

## 2017-01-30 DIAGNOSIS — Z4823 Encounter for aftercare following liver transplant: Principal | ICD-10-CM

## 2017-01-30 DIAGNOSIS — D649 Anemia, unspecified: Secondary | ICD-10-CM

## 2017-01-30 DIAGNOSIS — N400 Enlarged prostate without lower urinary tract symptoms: Secondary | ICD-10-CM

## 2017-01-30 DIAGNOSIS — I272 Pulmonary hypertension, unspecified: Secondary | ICD-10-CM

## 2017-01-30 DIAGNOSIS — Z9181 History of falling: Secondary | ICD-10-CM

## 2017-01-30 DIAGNOSIS — Z7982 Long term (current) use of aspirin: Secondary | ICD-10-CM

## 2017-01-30 DIAGNOSIS — Z794 Long term (current) use of insulin: Secondary | ICD-10-CM

## 2017-01-30 DIAGNOSIS — I4891 Unspecified atrial fibrillation: Secondary | ICD-10-CM

## 2017-01-30 DIAGNOSIS — Z431 Encounter for attention to gastrostomy: Secondary | ICD-10-CM

## 2017-01-30 DIAGNOSIS — E1142 Type 2 diabetes mellitus with diabetic polyneuropathy: Secondary | ICD-10-CM

## 2017-01-30 DIAGNOSIS — M961 Postlaminectomy syndrome, not elsewhere classified: Secondary | ICD-10-CM

## 2017-01-30 DIAGNOSIS — Z8619 Personal history of other infectious and parasitic diseases: Secondary | ICD-10-CM

## 2017-01-30 DIAGNOSIS — Z950 Presence of cardiac pacemaker: Secondary | ICD-10-CM

## 2017-01-30 DIAGNOSIS — G894 Chronic pain syndrome: Secondary | ICD-10-CM

## 2017-01-30 DIAGNOSIS — Z6821 Body mass index (BMI) 21.0-21.9, adult: Secondary | ICD-10-CM

## 2017-01-30 DIAGNOSIS — E46 Unspecified protein-calorie malnutrition: Secondary | ICD-10-CM

## 2017-01-30 DIAGNOSIS — J3801 Paralysis of vocal cords and larynx, unilateral: Secondary | ICD-10-CM

## 2017-01-30 MED FILL — DIGOXIN/0.125MG/TABS: DIGOXIN/0.125MG/TABS | 30 days supply | Qty: 15 | Fill #1

## 2017-01-30 NOTE — Unmapped (Signed)
Vocal Fold Augmentation Procedure (In-Office)    What is it?   This is designed to deliver a temporary filler material into one or both vocal folds to allow for improved closure of the vocal folds.  This is aimed at improving the voice, cough, and/or swallowing.     What is it used to treat?   Vocal fold paralysis  Vocal fold paresis  Vocal fold scar  Vocal atrophy    What can you expect?   A nasal decongestant and local anesthesia is applied to the nose.  Local anesthesia is applied to the skin.  Additional local anesthesia is applied directly to the larynx (voice box).  This often causes some coughing aiding in the anesthesia.  Anti-anxiety medication is rarely required. The surgeon???s assistant passes the endoscope through the nose for visualization of the vocal folds.  The surgeon then passes a needle into the vocal folds and administers the appropriate amount of material. Some people may experience some discomfort, pressure, ear pain, and/or coughing.  The entire visit is typically 30-45 minutes.  You may return to work or your normal daily activities after the procedure.  You may eat or drink one-hour post procedure.  The length of desired effect is dependent on the material used.     What are the risks?   Bleeding, bruising, airway swelling, difficulty breathing, discomfort, vasovagal event, hoarseness, changes in heart rate, blood pressure, anxiety, need for repeat procedure.    What are the advantages?   Avoidance of general anesthesia, decreased time spent, faster recovery, low rate of complications.     How do I prepare?   We recommend that you refrain from eating or drinking 2 hours prior to the procedure.  Take all of your normal medications unless specifically instructed to stop by your Doctor. Please discuss with your surgeon if you are taking aspirin, clopidogrel (Plavix), coumadin (Warfarin), dabigatran (Pradaxa), rivaroxaban (Xarelto), enoxaparin (Lovenox), apixaban (Eliquis) or vitamin E, Ginko biloba, and Fish oil. Please arrive prior to your scheduled time.     Where is it performed?   [facility]  [facility]  [address]  [place], [place]  [address]

## 2017-01-30 NOTE — Unmapped (Signed)
[facility] SPEECH [address]  OUTPATIENT SPEECH PATHOLOGY  01/30/2017      Patient Name: Jeffrey Ward  Date of Birth:1951-03-05  Session Number: 1  Diagnosis: Dysphonia: right vocal fold immobility   Transglottic gap   Encounter Diagnosis   Name Primary?   ??? Dysphonia       Date of Evaluation: 01/30/17  Date of Symptom Onset: 09/15/16  Referred by: Martina Sinner, MD  Reason for Referral: Evaluation Speech, Language, Voice, Cognition   Surgery date (if applicable): 09/15/16     Chief Complaint: dysphonia      ASSESSMENT:  severe dysphonia    Characterized by: aphonia, breathiness, strained voice, phonation breaks  Therapy Techniques utilized during trial/diagnostic therapy:: N/A ??? Voice therapy not warranted  Comments: Patient to pursue in-office vocal fold injection with Dr. Sherrie George: Pt was not stimulable     Treatment Recommendations: N/A       SLP G-Codes  Functional Limitations: Voice  Voice Current Status [phone]): At least 60 percent but less than 80 percent impaired, limited or restricted  Voice Goal Status ([id number]): At least 60 percent but less than 80 percent impaired, limited or restricted  Voice Discharge Status [phone]): At least 60 percent but less than 80 percent impaired, limited or restricted  Rationale: Gcodes scores chosen on this date based on GRBAS scores, acoustic/aerodynamic measures, other informal assessments    Prognosis:  Good    Negative Prognosis Rationale: Age, Severity of deficits       Positive Prognosis Rationale: Motivation, Good caregiver/family support      Goals:  Patient and Family Goals: to improve vocal quality     LTG #1: Patient's vocal function will improve for ADLs      SUBJECTIVE:  Jeffrey Ward is a 66 year old male with a history of DM with associated peripheral neuropathy, heart failure, afib, 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 Mr. Kulzer is seen in consultation at the request of Martina Sinner, MD for the evaluation of dysphonia. He spent 3 months in the hospital following his liver transplant and was ventilated via tracheostomy tube for much of that stay. He was decannulated in September before discharge home. 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  Patient reports he lost his voice following liver transplant episode 3 months ago. He states that since surgery his voice is breathy and weak. He cannot control his pitch. His voice symptoms do not fluctuate throughout the day and he has not noticed anything that makes it better or worse. He does have a cough throughout the day. He also endorses globus sensation. He denies dysphagia. He is maintained on an oral diet but has not had his G-tube removed yet due to surgeon preference. He has never had voice issues before and has never seen an ENT doctor for any related issues. He does endorse consistent shortness of breath with exertion and notes that this is likely secondary to his recovery process from his liver transplant. Patient does take Prilosec for his GERD and thinks that this is stable. He denies coughing or choking while eating. Voice symptoms are described as sudden onset, weak, breathy, pitch breaks, voice use causes strain.    Communication Preference: Verbal         Barriers to Learning: No Barriers  Prior treatment for referral reason: No                  Pain?: No      Precautions: Falls         Prior Function: Independent                    Past Medical History:   Diagnosis Date   ??? Cancer (CMS-HCC)    ??? Cirrhosis (CMS-HCC)    ??? Diabetes (CMS-HCC)    ??? Hepatitis C 07/17/2012   ??? Liver disease    ??? Low back pain 07/17/2012   ??? Varices, esophageal (CMS-HCC)       Family History   Problem Relation Age of Onset   ??? Hypertension Mother    ??? Cirrhosis Neg Hx    ??? Liver cancer Neg Hx    ??? Anemia Neg Hx    ??? Cancer Neg Hx    ??? Diabetes Neg Hx    ??? Kidney disease Neg Hx    ??? Obesity Neg Hx    ??? Thyroid disease Neg Hx    ??? Osteoporosis Neg Hx    ??? Coronary artery disease Neg Hx      Past Surgical History:   Procedure Laterality Date   ??? ANKLE SURGERY     ??? BACK SURGERY     ??? BACK SURGERY     ??? CHG US GUIDE, TISSUE ABLATION N/A 01/22/2016    Procedure: ULTRASOUND GUIDANCE FOR, AND MONITORING OF, PARENCHYMAL TISSUE ABLATION;  Surgeon: Particia Nearing, MD;  Location: MA[facility];  Service: Transplant   ??? IR EMBOLIZATION ORGAN ISCHEMIA, TUMORS, INFAR  06/16/2016    IR EMBOLIZATION ORGAN ISCHEMIA, TUMORS, INFAR 06/16/2016 Ammie Dalton, MD IMG VIR [facility]   ??? PR COLON CA SCRN NOT HI RSK IND N/A 02/27/2015    Procedure: COLOREC CNCR SCR;COLNSCPY NO;  Surgeon: Vonda Antigua, MD;  Location: GI PROC[facility]URES [facility];  Service: Gastroenterology   ??? PR ENDOSCOPIC ULTRASOUND EXAM N/A 02/27/2015    Procedure: UGI ENDO; W/ENDO ULTRASOUND EXAM INCLUDES ESOPHAGUS, STOMACH, &/OR DUODENUM/JEJUNUM;  Surgeon: Vonda Antigua, MD;  Location: GI [facility]URES [facility];  Service: Gastroenterology   ??? PR ERCP STENT PLACEMENT BILIARY/PANCREATIC DUCT N/A 11/29/2016    Procedure: ENDOSCOPIC RETROGRADE CHOLANGIOPANCREATOGRAPHY (ERCP); WITH PLACEMENT OF ENDOSCOPIC STENT INTO BILIARY OR PANCREATIC DUCT;  Surgeon: Chriss Driver, MD;  Location: GI PROCEDURES [facility];  Service: Gastroenterology   ??? PR ERCP,W/REMOVAL STONE,BIL/PANCR DUCTS N/A 11/29/2016    Procedure: ERCP; W/ENDOSCOPIC RETROGRADE REMOVAL OF CALCULUS/CALCULI FROM BILIARY &/OR PANCREATIC DUCTS;  Surgeon: Chriss Driver, MD;  Location: GI PROC[facility]URES [facility];  Service: Gastroenterology   ??? PR INSER HEART TEMP PACER ONE CHMBR N/A 10/02/2016    Procedure: Tempoarary Pacemaker Insertion;  Surgeon: Meredith Leeds, MD;  Location: [facility] EP;  Service: Cardiology   ??? PR LAP,DIAGNOSTIC ABDOMEN N/A 01/22/2016    Procedure: Laparoscopy, Abdomen, Peritoneum, & Omentum, Diagnostic, W/Wo Collection Specimen(S) By Brushing Or Washing;  Surgeon: Particia Nearing, MD;  Location: MAIN OR [facility];  Service: Transplant   ??? PR PLACE PERCUT GASTROSTOMY TUBE N/A 11/17/2016    Procedure: UGI ENDO; W/DIRECTED PLCMT PERQ GASTROSTOMY TUBE;  Surgeon: Cletis Athens, MD;  Location: GI [facility]URES [facility];  Service: Gastroenterology   ??? PR TRACHEOSTOMY, PLANNED N/A 09/29/2016    Procedure: TRACHEOSTOMY PLANNED (SEPART PROC);  Surgeon: Katherina Mires, MD;  Location: MAIN OR [facility];  Service: Trauma   ???  PR TRANSCATH INSERT OR REPLACE LEADLESS PM VENTR N/A 10/11/2016    Procedure: Pacemaker Implant/Replace Leadless;  Surgeon: Meredith Leeds, MD;  Location: [facility] EP;  Service: Cardiology   ??? PR TRANSPLANT LIVER,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    Procedure: Liver Allotransplantation; Orthotopic, Partial Or Whole, From Cadaver Or Living Donor, Any Age;  Surgeon: Doyce Loose, MD;  Location: MAIN OR [facility];  Service: Transplant   ??? PR TRANSPLANT,PREP DONOR LIVER, WHOLE N/A 09/15/2016    Procedure: Rogelia Boga Std Prep Cad Donor Whole Liver Gft Prior Tnsplnt,Inc Chole,Diss/Rem Surr Tissu Wo Triseg/Lobe Splt;  Surgeon: Doyce Loose, MD;  Location: MAIN OR [facility];  Service: Transplant   ??? PR UPPER GI ENDOSCOPY,BIOPSY N/A 07/17/2012    Procedure: UGI ENDOSCOPY; WITH BIOPSY, SINGLE OR MULTIPLE;  Surgeon: Alba Destine, MD;  Location: GI PROC[facility]URES [facility];  Service: Gastroenterology   ??? PR UPPER GI ENDOSCOPY,DIAGNOSIS N/A 02/04/2014    Procedure: UGI ENDO, INCLUDE ESOPHAGUS, STOMACH, & DUODENUM &/OR JEJUNUM; DX W/WO COLLECTION SPECIMN, BY BRUSH OR WASH;  Surgeon: Wilburt Finlay, MD;  Location: GI [facility]URES [facility];  Service: Gastroenterology   ??? PR UPPER GI ENDOSCOPY,LIGAT VARIX N/A 11/05/2013    Procedure: UGI ENDO; W/BAND LIG ESOPH &/OR GASTRIC VARICES;  Surgeon: Wilburt Finlay, MD;  Location: GI PROC[facility]URES [facility];  Service: Gastroenterology    No Known Allergies Social History   Substance Use Topics   ??? Smoking status: Never Smoker   ??? Smokeless tobacco: Never Used      Comment: Smoked in high school for about 5 years.    ??? Alcohol use No      Current Outpatient Prescriptions   Medication Sig Dispense Refill   ??? acetaminophen (TYLENOL) 160 mg/5 mL solution Take 10.2 mL (325 mg total) by mouth every four (4) hours as needed for pain. 300 mL 0   ??? blood sugar diagnostic (FREESTYLE TEST) Strp by Other route Four (4) times a day (before meals and nightly). 100 each 11   ??? blood-glucose meter (GLUCOSE MONITORING KIT) kit Use as instructed 1 each 0   ??? capsaicin (ZOSTRIX) 0.025 % cream Apply topically Two (2) times a day. 60 g 0   ??? cholecalciferol, vitamin D3, 2,000 unit cap 1 capsule (2,000 Units total) by PEG Tube route daily. 30 capsule 11   ??? digoxin (LANOXIN) 125 mcg tablet 0.5 tablets (62.5 mcg total) by G-tube route daily. 15 tablet 11   ??? docusate (COLACE) 50 mg/5 mL liquid Take 100 mg by mouth two (2) times a day as needed.     ??? insulin ASPART (NOVOLOG FLEXPEN U-100 INSULIN) 100 unit/mL injection pen Inject 0.05 mL (5 Units total) under the skin Three (3) times a day before meals. 4 mL 11   ??? insulin ASPART (NOVOLOG FLEXPEN) 100 unit/mL injection pen Inject 2 Units under the skin Three (3) times a day before meals. per SSI-  151-200= 2 units,  201-250= 4 units, 251-300=add 6 units, 301-350=8 units, 351-400=10 units, >400 =12 units     ??? insulin glargine (LANTUS) 100 unit/mL (3 mL) injection pen Inject 0.12 mL (12 Units total) under the skin nightly. 4 mL 11   ??? lancets Misc 1 each by Miscellaneous route Four (4) times a day (before meals and nightly). 100 each 11   ??? magnesium oxide (MAG-OX) 400 mg (241.3 mg magnesium) tablet Take 1 tablet (400 mg total) by mouth Two (2) times a day. 120 tablet 11   ??? magnesium oxide-Mg  AA chelate 133 mg Tab Take 1 tablet by mouth Two (2) times a day. Picking up today 60 tablet 0   ??? melatonin 5 mg cap Take 1 capsule by mouth nightly as needed.     ??? mycophenolate (CELLCEPT) 250 mg capsule Take 1 capsule (250 mg total) by mouth Two (2) times a day. 240 capsule 2   ??? NEORAL 100 mg capsule Take 2 capsules (200 mg total) by mouth Two (2) times a day. 120 capsule 11   ??? omeprazole (PRILOSEC) 20 MG capsule Take 1 capsule (20 mg total) by mouth daily. 30 capsule 1   ??? pen needle, diabetic (ULTICARE PEN NEEDLE) 32 gauge x 5/32 Ndle 1 each by Miscellaneous route Four (4) times a day (before meals and nightly). 90 each 3   ??? polyethylene glycol (GLYCOLAX) 17 gram/dose powder Take 17 g by mouth daily as needed.     ??? sildenafil, antihypertensive, (REVATIO) 20 mg tablet Take 2 tablets (40 mg total) by mouth Three (3) times a day. 180 tablet 11   ??? sulfamethoxazole-trimethoprim (BACTRIM,SEPTRA) 400-80 mg per tablet Take 1 tablet (80 mg of trimethoprim total) by mouth 3 (three) times a week. 27 tablet 0   ??? thiamine (B-1) 100 MG tablet Take 100 mg by mouth daily.     ??? traMADol (ULTRAM) 50 mg tablet Take 50 mg by mouth. Every 4 to 6 hours PRN     ??? warfarin (COUMADIN) 1 MG tablet 3 mg Tuesday, Thursday, Saturday, Sunday and 2 mg Monday, Wednesday, Friday. 72 tablet 11     No current facility-administered medications for this visit.        OBJECTIVE  Voice - General   Hydration: 4-5 bottles of water per day  Caffeine Use: occassional coffee  Onset: Sudden  Daily Voice Use: Moderate demand   Acoustic Measures  Connected Sample: CAPE-V Sentences  Connected Sample 1 (Hz): 218.08 Hz   Total Fundamental Frequency  Total Fundamental Frequency Range (Hz): 208 Hz  to (Hz): 333 Hz   Sustained Phonation  Mean Fund Freq (Hz): 219.18 Hz  Relative Average Perturbation (RAP) %: 12.81 %  Noise-to-Harmonic Ratio (NHR): 0.9  Degree of Subharmonics (DSH) %: 0 %  Degree of Voiceless (DUV) %: 96.49 %  Soft Phonation Index (SPI) : 2.74   Aerodynamic Measures  Maximum phonation time on /a/ in sec:: 2.5 sec  Mean Flow Rate (mL/sec): 1360 mL/sec  at (Hz): 200.1 Hz  and (dB): 81.73 dB  Objective and/or perceptual measures suggest:: Weak voice, Hyperfunctional pattern  Primary Breath Support: Thoracic, Diaphragmatic  Speaks on functional residual capacity?: No       Perceptual Measures  Grade:: 3  Breathy:: 3  Strained:: 2  Rough:: 2  Asthenic:: 3  Total:: 13 /15   Tremor / Rate of Speech  Tremor:: Absent  Rate of Speech:: WFL   Subjective Measures  Glottal Function Index (GFI):: 20  Voice-related Quality of Life (V-RQOL):: 20    Session Duration : 25    Today's Charges (noted here with $$):     SLP Evaluations  $$ Laryngeal Function Study - Voice Ctr only [mins]: 25    I attest that I have reviewed the above information.  Signed: Patricia Nettle, SLP  01/30/2017 2:23 PM

## 2017-01-30 NOTE — Unmapped (Signed)
Otolaryngology New Patient Consult Visit    Reason for visit:  [name] is seen in consultation at the request of Lipscomb-Hudson, Marylene Land* for the evaluation of dysphonia.    History of Present Illness:  [name] is a 66 y.o. 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 He spent 3 months in the hospital following his transplant he had respiratory compromise and ultimately had tracheostomy tube for much of that stay. He was decannulated in September before discharge home. 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  Patient reports he lost his voice following liver transplant episode 3 months ago. He states that since surgery his voice is breathy and weak. He cannot control his pitch. His voice symptoms do not fluctuate throughout the day and he has not noticed anything that makes it better or worse. He does have a cough throughout the day. He also endorses globus sensation. He denies dysphagia. He is maintained on an oral diet but has not had his G-tube removed yet due to surgeon preference. He has never had voice issues before and has never seen an ENT doctor for any related issues. He does endorse consistent shortness of breath with exertion and notes that this is likely secondary to his recovery process from his liver transplant. She does take Prilosec for his GERD. Thinks that this is stable. No coughing or choking while eating.    Voice symptoms are described as sudden onset, weak, breathy, pitch breaks, voice use causes strain  Singing voice symptoms include: None  Reflux symptoms include: heartburn  Airway symptoms include: SOB with vigorous activity     Past Medical History:  Past Medical History:   Diagnosis Date   ??? Cancer (CMS-HCC)    ??? Cirrhosis (CMS-HCC)    ??? Diabetes (CMS-HCC)    ??? Hepatitis C 07/17/2012   ??? Liver disease    ??? Low back pain 07/17/2012   ??? Varices, esophageal (CMS-HCC)        Past Surgical History:  Past Surgical History:   Procedure Laterality Date   ??? ANKLE SURGERY     ??? BACK SURGERY     ??? BACK SURGERY     ??? CHG US GUIDE, TISSUE ABLATION N/A 01/22/2016    Procedure: ULTRASOUND GUIDANCE FOR, AND MONITORING OF, PARENCHYMAL TISSUE ABLATION;  Surgeon: Particia Nearing, MD;  Location: MAIN OR [facility];  Service: Transplant   ??? IR EMBOLIZATION ORGAN ISCHEMIA, TUMORS, INFAR  06/16/2016    IR EMBOLIZATION ORGAN ISCHEMIA, TUMORS, INFAR 06/16/2016 Ammie Dalton, MD IMG VIR [facility]   ??? PR COLON CA SCRN NOT HI RSK IND N/A 02/27/2015    Procedure: COLOREC CNCR SCR;COLNSCPY NO;  Surgeon: Vonda Antigua, MD;  Location: GI PROCEDURES [facility]ORIAL [facility];  Service: Gastroenterology   ??? PR ENDOSCOPIC ULTRASOUND EXAM N/A 02/27/2015    Procedure: UGI ENDO; W/ENDO ULTRASOUND EXAM INCLUDES ESOPHAGUS, STOMACH, &/OR DUODENUM/JEJUNUM;  Surgeon: Vonda Antigua, MD;  Location: GI PROC[facility]URES [facility];  Service: Gastroenterology   ??? PR ERCP STENT PLACEMENT BILIARY/PANCREATIC DUCT N/A 11/29/2016    Procedure: ENDOSCOPIC RETROGRADE CHOLANGIOPANCREATOGRAPHY (ERCP); WITH PLACEMENT OF ENDOSCOPIC STENT INTO BILIARY OR PANCREATIC DUCT;  Surgeon: Chriss Driver, MD;  Location: GI PROCEDURES [facility];  Service: Gastroenterology   ??? PR ERCP,W/REMOVAL STONE,BIL/PANCR DUCTS N/A 11/29/2016  Procedure: ERCP; W/ENDOSCOPIC RETROGRADE REMOVAL OF CALCULUS/CALCULI FROM BILIARY &/OR PANCREATIC DUCTS;  Surgeon: Chriss Driver, MD;  Location: GI PROCEDURES [facility];  Service: Gastroenterology   ??? PR INSER HEART TEMP PACER ONE CHMBR N/A 10/02/2016    Procedure: Tempoarary Pacemaker Insertion;  Surgeon: Meredith Leeds, MD;  Location: [facility];  Service: Cardiology   ??? PR LAP,DIAGNOSTIC ABDOMEN N/A 01/22/2016    Procedure: Laparoscopy, Abdomen, Peritoneum, & Omentum, Diagnostic, W/Wo Collection Specimen(S) By Brushing Or Washing;  Surgeon: Particia Nearing, MD;  Location: MAIN OR [facility];  Service: Transplant   ??? PR PLACE PERCUT GASTROSTOMY TUBE N/A 11/17/2016    Procedure: UGI ENDO; W/DIRECTED PLCMT PERQ GASTROSTOMY TUBE;  Surgeon: Cletis Athens, MD;  Location: GI PROCEDURES [facility];  Service: Gastroenterology   ??? PR TRACHEOSTOMY, PLANNED N/A 09/29/2016    Procedure: TRACHEOSTOMY PLANNED (SEPART PROC);  Surgeon: Katherina Mires, MD;  Location: MAIN OR [facility];  Service: Trauma   ??? PR TRANSCATH INSERT OR REPLACE LEADLESS PM VENTR N/A 10/11/2016    Procedure: Pacemaker Implant/Replace Leadless;  Surgeon: Meredith Leeds, MD;  Location: [facility];  Service: Cardiology   ??? PR TRANSPLANT LIVER,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    Procedure: Liver Allotransplantation; Orthotopic, Partial Or Whole, From Cadaver Or Living Donor, Any Age;  Surgeon: Doyce Loose, MD;  Location: MAIN OR [facility];  Service: Transplant   ??? PR TRANSPLANT,PREP DONOR LIVER, WHOLE N/A 09/15/2016    Procedure: Rogelia Boga Std Prep Cad Donor Whole Liver Gft Prior Tnsplnt,Inc Chole,Diss/Rem Surr Tissu Wo Triseg/Lobe Splt;  Surgeon: Doyce Loose, MD;  Location: MAIN OR [facility];  Service: Transplant   ??? PR UPPER GI ENDOSCOPY,BIOPSY N/A 07/17/2012    Procedure: UGI ENDOSCOPY; WITH BIOPSY, SINGLE OR MULTIPLE;  Surgeon: Alba Destine, MD;  Location: GI PROCEDURES [facility]IAL [facility];  Service: Gastroenterology   ??? PR UPPER GI ENDOSCOPY,DIAGNOSIS N/A 02/04/2014    Procedure: UGI ENDO, INCLUDE ESOPHAGUS, STOMACH, & DUODENUM &/OR JEJUNUM; DX W/WO COLLECTION SPECIMN, BY BRUSH OR WASH;  Surgeon: Wilburt Finlay, MD;  Location: GI PROCEDURES [facility];  Service: Gastroenterology   ??? PR UPPER GI ENDOSCOPY,LIGAT VARIX N/A 11/05/2013    Procedure: UGI ENDO; W/BAND LIG ESOPH &/OR GASTRIC VARICES;  Surgeon: Wilburt Finlay, MD;  Location: GI PROCEDURES [facility];  Service: Gastroenterology       Medications:    Current Outpatient Prescriptions:   ???  acetaminophen (TYLENOL) 160 mg/5 mL solution, Take 10.2 mL (325 mg total) by mouth every four (4) hours as needed for pain., Disp: 300 mL, Rfl: 0  ???  blood sugar diagnostic (FREESTYLE TEST) Strp, by Other route Four (4) times a day (before meals and nightly)., Disp: 100 each, Rfl: 11  ???  blood-glucose meter (GLUCOSE MONITORING KIT) kit, Use as instructed, Disp: 1 each, Rfl: 0  ???  capsaicin (ZOSTRIX) 0.025 % cream, Apply topically Two (2) times a day., Disp: 60 g, Rfl: 0  ???  cholecalciferol, vitamin D3, 2,000 unit cap, 1 capsule (2,000 Units total) by PEG Tube route daily., Disp: 30 capsule, Rfl: 11  ???  digoxin (LANOXIN) 125 mcg tablet, 0.5 tablets (62.5 mcg total) by G-tube route daily., Disp: 15 tablet, Rfl: 11  ???  insulin ASPART (NOVOLOG FLEXPEN U-100 INSULIN) 100 unit/mL injection pen, Inject 0.05 mL (5 Units total) under the skin Three (3) times a day before meals., Disp: 4 mL, Rfl: 11  ???  insulin ASPART (NOVOLOG FLEXPEN) 100 unit/mL injection pen, Inject 2 Units under the  skin Three (3) times a day before meals. per SSI-  151-200= 2 units,  201-250= 4 units, 251-300=add 6 units, 301-350=8 units, 351-400=10 units, >400 =12 units, Disp: , Rfl:   ???  insulin glargine (LANTUS) 100 unit/mL (3 mL) injection pen, Inject 0.12 mL (12 Units total) under the skin nightly., Disp: 4 mL, Rfl: 11  ???  lancets Misc, 1 each by Miscellaneous route Four (4) times a day (before meals and nightly)., Disp: 100 each, Rfl: 11  ???  magnesium oxide (MAG-OX) 400 mg (241.3 mg magnesium) tablet, Take 1 tablet (400 mg total) by mouth Two (2) times a day., Disp: 120 tablet, Rfl: 11  ???  magnesium oxide-Mg AA chelate 133 mg Tab, Take 1 tablet by mouth Two (2) times a day. Picking up today, Disp: 60 tablet, Rfl: 0  ???  melatonin 5 mg cap, Take 1 capsule by mouth nightly as needed., Disp: , Rfl:   ???  mycophenolate (CELLCEPT) 250 mg capsule, Take 1 capsule (250 mg total) by mouth Two (2) times a day., Disp: 240 capsule, Rfl: 2  ???  NEORAL 100 mg capsule, Take 2 capsules (200 mg total) by mouth Two (2) times a day., Disp: 120 capsule, Rfl: 11  ???  omeprazole (PRILOSEC) 20 MG capsule, Take 1 capsule (20 mg total) by mouth daily., Disp: 30 capsule, Rfl: 1  ???  pen needle, diabetic (ULTICARE PEN NEEDLE) 32 gauge x 5/32 Ndle, 1 each by Miscellaneous route Four (4) times a day (before meals and nightly)., Disp: 90 each, Rfl: 3  ???  polyethylene glycol (GLYCOLAX) 17 gram/dose powder, Take 17 g by mouth daily as needed., Disp: , Rfl:   ???  sildenafil, antihypertensive, (REVATIO) 20 mg tablet, Take 2 tablets (40 mg total) by mouth Three (3) times a day., Disp: 180 tablet, Rfl: 11  ???  sulfamethoxazole-trimethoprim (BACTRIM,SEPTRA) 400-80 mg per tablet, Take 1 tablet (80 mg of trimethoprim total) by mouth 3 (three) times a week., Disp: 27 tablet, Rfl: 0  ???  thiamine (B-1) 100 MG tablet, Take 100 mg by mouth daily., Disp: , Rfl:   ???  traMADol (ULTRAM) 50 mg tablet, Take 50 mg by mouth. Every 4 to 6 hours PRN, Disp: , Rfl:   ???  warfarin (COUMADIN) 1 MG tablet, 3 mg Tuesday, Thursday, Saturday, Sunday and 2 mg Monday, Wednesday, Friday., Disp: 72 tablet, Rfl: 11  ???  docusate (COLACE) 50 mg/5 mL liquid, Take 100 mg by mouth two (2) times a day as needed., Disp: , Rfl:      Allergies:  No Known Allergies    Family History:  Family History   Problem Relation Age of Onset   ??? Hypertension Mother    ??? Cirrhosis Neg Hx    ??? Liver cancer Neg Hx    ??? Anemia Neg Hx    ??? Cancer Neg Hx    ??? Diabetes Neg Hx    ??? Kidney disease Neg Hx    ??? Obesity Neg Hx    ??? Thyroid disease Neg Hx    ??? Osteoporosis Neg Hx    ??? Coronary artery disease Neg Hx      Social History:  Social History   Substance Use Topics   ??? Smoking status: Never Smoker   ??? Smokeless tobacco: Never Used      Comment: Smoked in high school for about 5 years.    ??? Alcohol use No       Review of Systems:  The balance  of 11 systems were reviewed and are negative except as noted in HPI, intake form, or below.   The balance of more than 10 systems reviewed were negative except as noted in the HPI.    Physical Exam:   Constitutional:  Vitals reviewed in chart, patient has normal appearance. Well nourished, well-developed, no acute distress  Voice:  Hoarse, breathy, weak voice  Respiration:  Breathing comfortably, no stridor.  CV: No clubbing/cyanosis/edema in hands.   Eyes:  extraocular motion intact, sclera normal.   Neuro:  Alert and oriented times 3, Cranial nerves 2-12 intact and symmetric bilaterally.   Head and Face:  Skin with no masses or lesions, sinuses nontender to palpation, facial nerve fully intact.   Salivary Glands:  Parotid and submandibular glands normal bilaterally.   Ears:  Normal tympanic membranes to otoscopy, normal hearing to whispered voice.   Nose:  External nose midline, anterior rhinoscopy is normal with limited visualization just to the anterior interior turbinate.   Oral Cavity/Oropharynx/Lips:  Normal mucous membranes, normal floor of mouth/tongue/oropharynx, no masses or lesions are noted.  Good dentition  Pharyngeal Walls:  No masses noted.  Neck/Lymph:  No lymphadenopathy, no thyroid masses. Previous tracheostoma still patent with moderate granulation tissue filling the fistula. No erythema or drainage or purulence noted.   Larynx: Mirror evaluation insufficient to visualize secondary to prominent gag        Procedure Note    Endoscopy Type:  Flexible Fiberoptic Videostroboscopy    Indications/TimeOut:  To better evaluate the patient???s symptoms, fiberoptic videostroboscopy is indicated.   A time out identifying the patient, the procedure, the location of the procedure and any concerns was performed prior to beginning the procedure.    Procedure Details:    The patient was placed in the sitting position.  After topical anesthesia and decongestion with oxymetazoline and 1% lidocaine, the flexible laryngoscope was passed.  The microphone was used to trigger the xenon stroboscopic light source.    -  Nasal cavity  - There was no pus or polyps noted in the nasal cavity. Septum intact  -  Nasopharynx - there was no evidence of mass or lesion.  -  Hypopharynx - There were no lesions in the pyriformis, epiglottis, or base of tongue.  -  Larynx -  there was mild interarytenoid edema, no erythema.  -  Vocal Folds -     Mobility: Right VF immobility   Amplitude: Unable to interpret secondary to Glottic gap   Mucosal Wave: unable to assess secondary to glottic incompetence   Closure: Transglottic gap   Lesions/Other findings: Right vocal fold atrophy    Condition:  Stable.  Patient tolerated procedure well.    Complications:  None    VRQOL: 20  GFI: 20    Assessment:   [name] is a 65 y.o. 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  He  has a past medical history of Cancer (CMS-HCC); Cirrhosis (CMS-HCC); Diabetes (CMS-HCC); Hepatitis C (07/17/2012); Liver disease; Low back pain (07/17/2012); and Varices, esophageal (CMS-HCC).    Findings:   dypshonia  right vocal fold immobility   Glottic insufficiency   --all temporally related to liver transplant transplant. (prolonged intubation, trach possible etiologies)     Plan:  Silver nitrate applied to previous trachea stoma granulation tissue. We'll continue to monitor. If this doesn't resolve, may revise tc site in future.  Could consider CT of neck to rule out lesion along RLN although unlikely given history and temporal relation to transplant  For voice rehab, recommend temporary injection augmentation procedure. Discussed OR vs. Office. He would like to proceed with an in-office injection. (TTH, cymetra).   Will continue to monitor for recovery.    may need post-injection Speech therapy which is necessary and standard of care.

## 2017-01-30 NOTE — Unmapped (Signed)
Jeffrey Ward, am ordering a MRI for Jeffrey Ward, [id number].   I have determined and discussed with the patient that this MRI is clinically-indicated without acceptable alternative imaging techniques available and the results of this MRI will impact patient care. ??   ??   The benefits and risks of a CIED will be discussed with and assessed by Cardiology prior to MRI imaging.   ??     Aleen Campi, MD

## 2017-01-31 ENCOUNTER — Ambulatory Visit
Admission: RE | Admit: 2017-01-31 | Discharge: 2017-03-01 | Disposition: A | Discharge status: Home / Self Care | Payer: MEDICARE | Attending: Rehabilitative and Restorative Service Providers" | Admitting: Rehabilitative and Restorative Service Providers"

## 2017-01-31 ENCOUNTER — Ambulatory Visit
Admission: RE | Admit: 2017-01-31 | Discharge: 2017-01-31 | Disposition: A | Discharge status: Home / Self Care | Payer: MEDICARE

## 2017-01-31 DIAGNOSIS — Z431 Encounter for attention to gastrostomy: Secondary | ICD-10-CM

## 2017-01-31 DIAGNOSIS — I272 Pulmonary hypertension, unspecified: Secondary | ICD-10-CM

## 2017-01-31 DIAGNOSIS — E1142 Type 2 diabetes mellitus with diabetic polyneuropathy: Secondary | ICD-10-CM

## 2017-01-31 DIAGNOSIS — Z9181 History of falling: Secondary | ICD-10-CM

## 2017-01-31 DIAGNOSIS — Z8619 Personal history of other infectious and parasitic diseases: Secondary | ICD-10-CM

## 2017-01-31 DIAGNOSIS — Z7982 Long term (current) use of aspirin: Secondary | ICD-10-CM

## 2017-01-31 DIAGNOSIS — Z6821 Body mass index (BMI) 21.0-21.9, adult: Secondary | ICD-10-CM

## 2017-01-31 DIAGNOSIS — G894 Chronic pain syndrome: Secondary | ICD-10-CM

## 2017-01-31 DIAGNOSIS — Z4823 Encounter for aftercare following liver transplant: Principal | ICD-10-CM

## 2017-01-31 DIAGNOSIS — D649 Anemia, unspecified: Secondary | ICD-10-CM

## 2017-01-31 DIAGNOSIS — M961 Postlaminectomy syndrome, not elsewhere classified: Secondary | ICD-10-CM

## 2017-01-31 DIAGNOSIS — Z794 Long term (current) use of insulin: Secondary | ICD-10-CM

## 2017-01-31 DIAGNOSIS — G6289 Other specified polyneuropathies: Secondary | ICD-10-CM

## 2017-01-31 DIAGNOSIS — Z7409 Other reduced mobility: Secondary | ICD-10-CM

## 2017-01-31 DIAGNOSIS — Z8505 Personal history of malignant neoplasm of liver: Secondary | ICD-10-CM

## 2017-01-31 DIAGNOSIS — K219 Gastro-esophageal reflux disease without esophagitis: Secondary | ICD-10-CM

## 2017-01-31 DIAGNOSIS — Z950 Presence of cardiac pacemaker: Secondary | ICD-10-CM

## 2017-01-31 DIAGNOSIS — I4891 Unspecified atrial fibrillation: Secondary | ICD-10-CM

## 2017-01-31 DIAGNOSIS — E46 Unspecified protein-calorie malnutrition: Secondary | ICD-10-CM

## 2017-01-31 DIAGNOSIS — N400 Enlarged prostate without lower urinary tract symptoms: Secondary | ICD-10-CM

## 2017-01-31 DIAGNOSIS — R29898 Other symptoms and signs involving the musculoskeletal system: Secondary | ICD-10-CM

## 2017-01-31 DIAGNOSIS — M5442 Lumbago with sciatica, left side: Principal | ICD-10-CM

## 2017-01-31 LAB — CBC W/ AUTO DIFF
BASOPHILS ABSOLUTE COUNT: 0 10*9/L (ref 0.0–0.1)
EOSINOPHILS ABSOLUTE COUNT: 0 10*9/L (ref 0.0–0.4)
HEMATOCRIT: 32.4 % — ABNORMAL LOW (ref 41.0–53.0)
HEMOGLOBIN: 10.7 g/dL — ABNORMAL LOW (ref 13.5–17.5)
LARGE UNSTAINED CELLS: 2 % (ref 0–4)
LYMPHOCYTES ABSOLUTE COUNT: 0.9 10*9/L — ABNORMAL LOW (ref 1.5–5.0)
MEAN CORPUSCULAR HEMOGLOBIN: 31.3 pg (ref 26.0–34.0)
MEAN CORPUSCULAR VOLUME: 94.5 fL (ref 80.0–100.0)
MEAN PLATELET VOLUME: 8.2 fL (ref 7.0–10.0)
MONOCYTES ABSOLUTE COUNT: 0.3 10*9/L (ref 0.2–0.8)
NEUTROPHILS ABSOLUTE COUNT: 2.9 10*9/L (ref 2.0–7.5)
PLATELET COUNT: 155 10*9/L (ref 150–440)
RED BLOOD CELL COUNT: 3.42 10*12/L — ABNORMAL LOW (ref 4.50–5.90)

## 2017-01-31 LAB — COMPREHENSIVE METABOLIC PANEL
ALBUMIN: 3.3 g/dL — ABNORMAL LOW (ref 3.5–5.0)
ALKALINE PHOSPHATASE: 589 U/L — ABNORMAL HIGH (ref 38–126)
ALT (SGPT): 85 U/L — ABNORMAL HIGH (ref 19–72)
ANION GAP: 7 mmol/L — ABNORMAL LOW (ref 9–15)
AST (SGOT): 55 U/L (ref 19–55)
BILIRUBIN TOTAL: 0.9 mg/dL (ref 0.0–1.2)
BLOOD UREA NITROGEN: 24 mg/dL — ABNORMAL HIGH (ref 7–21)
CALCIUM: 9.7 mg/dL (ref 8.5–10.2)
CHLORIDE: 104 mmol/L (ref 98–107)
CO2: 26 mmol/L (ref 22.0–30.0)
CREATININE: 1.28 mg/dL (ref 0.70–1.30)
EGFR MDRD AF AMER: >=60 mL/min/{1.73_m2} (ref >=60)
EGFR MDRD NON AF AMER: 56 mL/min/{1.73_m2} — ABNORMAL LOW (ref >=60)
GLUCOSE RANDOM: 149 mg/dL (ref 65–179)
POTASSIUM: 4.7 mmol/L (ref 3.5–5.0)
PROTEIN TOTAL: 6 g/dL — ABNORMAL LOW (ref 6.5–8.3)
SODIUM: 137 mmol/L (ref 135–145)

## 2017-01-31 LAB — MAGNESIUM: Magnesium:MCnc:Pt:Ser/Plas:Qn:: 1.7

## 2017-01-31 LAB — PROTIME: Lab: 32.6 — ABNORMAL HIGH

## 2017-01-31 LAB — ANION GAP: Anion gap 3:SCnc:Pt:Ser/Plas:Qn:: 7 — ABNORMAL LOW

## 2017-01-31 LAB — BASOPHILS ABSOLUTE COUNT: Lab: 0

## 2017-01-31 LAB — CYCLOSPORINE, TROUGH: Lab: 304

## 2017-01-31 LAB — GAMMA GLUTAMYL TRANSFERASE: Gamma glutamyl transferase:CCnc:Pt:Ser/Plas:Qn:: 588 — ABNORMAL HIGH

## 2017-01-31 LAB — PHOSPHORUS: Phosphate:MCnc:Pt:Ser/Plas:Qn:: 2.8 — ABNORMAL LOW

## 2017-02-01 DIAGNOSIS — E46 Unspecified protein-calorie malnutrition: Secondary | ICD-10-CM

## 2017-02-01 DIAGNOSIS — Z8505 Personal history of malignant neoplasm of liver: Secondary | ICD-10-CM

## 2017-02-01 DIAGNOSIS — Z8619 Personal history of other infectious and parasitic diseases: Secondary | ICD-10-CM

## 2017-02-01 DIAGNOSIS — Z4823 Encounter for aftercare following liver transplant: Principal | ICD-10-CM

## 2017-02-01 DIAGNOSIS — Z794 Long term (current) use of insulin: Secondary | ICD-10-CM

## 2017-02-01 DIAGNOSIS — Z9181 History of falling: Secondary | ICD-10-CM

## 2017-02-01 DIAGNOSIS — Z6821 Body mass index (BMI) 21.0-21.9, adult: Secondary | ICD-10-CM

## 2017-02-01 DIAGNOSIS — I4891 Unspecified atrial fibrillation: Secondary | ICD-10-CM

## 2017-02-01 DIAGNOSIS — I272 Pulmonary hypertension, unspecified: Secondary | ICD-10-CM

## 2017-02-01 DIAGNOSIS — N400 Enlarged prostate without lower urinary tract symptoms: Secondary | ICD-10-CM

## 2017-02-01 DIAGNOSIS — Z7982 Long term (current) use of aspirin: Secondary | ICD-10-CM

## 2017-02-01 DIAGNOSIS — Z431 Encounter for attention to gastrostomy: Secondary | ICD-10-CM

## 2017-02-01 DIAGNOSIS — K219 Gastro-esophageal reflux disease without esophagitis: Secondary | ICD-10-CM

## 2017-02-01 DIAGNOSIS — G894 Chronic pain syndrome: Secondary | ICD-10-CM

## 2017-02-01 DIAGNOSIS — M961 Postlaminectomy syndrome, not elsewhere classified: Secondary | ICD-10-CM

## 2017-02-01 DIAGNOSIS — Z950 Presence of cardiac pacemaker: Secondary | ICD-10-CM

## 2017-02-01 DIAGNOSIS — E1142 Type 2 diabetes mellitus with diabetic polyneuropathy: Secondary | ICD-10-CM

## 2017-02-01 DIAGNOSIS — D649 Anemia, unspecified: Secondary | ICD-10-CM

## 2017-02-01 NOTE — Unmapped (Addendum)
[place] [facility]  OUTPATIENT PHYSICAL THERAPY  01/31/2017  Note Type: Evaluation    Patient Name: Jeffrey Ward  Date of Birth:[birth year]  Session Number: 1  Diagnosis:   Encounter Diagnoses   Name Primary?   ??? Midline low back pain with left-sided sciatica, unspecified chronicity    ??? Other polyneuropathy    ??? Weakness of both lower extremities    ??? Impaired functional mobility, balance, gait, and endurance Yes   ??? Risk for falls        Onset of Symptoms: 10/31/16  Date of Evaluation: 01/31/17  Referring MD: Austin Miles, MD  Dates of Certification: 01/31/17 - 04/11/17  Chief Complaint/Reason for Referral: balance and LE strength    ASSESSMENT:      Pt is a 66 year old male being seen for LE weakness and gait and balance impairment placing him at increased risk for falls. PMHx includes: liver transplant 09/2016, hepatitis C, heart failure, DM, neuropathy, and R ankle and spinal fusion from traumatic injury 15 years ago. Pt demonstrated impaired functional mobility, gait, balance, and LE strength. Pt performed TUG, 5x STS, and 4-stage balance test, with results of all tests indicating high risk for falls. Pt is independent with bed mobility and modified independent for transfers and ambulation. Pt ambulates with rollator and demonstrates safety with transfers and use of AD. Pt would benefit from skilled physical therapy to address LE weakness, gait and balance deficits, and functional mobility to reduce risk for falls and to increase community independence.     Problem List: Decreased strength, Decreased range of motion, Decreased endurance, Impaired balance, Decreased mobility, Impaired sensation, Pain, Fall Risk, Gait deviation      PT G-Code:  [id number] Mobility current status;  CM - At least 80 percent but less than 100 percent impaired, limited or restricted  [id number] Mobility goal status;  CK - At least 40 percent but less than 60 percent impaired, limited or restricted    Severity modifier selection based on outcome measure score(s), objective findings, and co-morbidity assessment.            Short Term Goals:  Patient/Family Goals: Improve strength, Improve ambulation, Improve balance    Short Term Goal 1: Pt will demonstrate independence in performance of HEP x 1 in clinic to maximize benefits of PT.  Time Frame : 4 weeks   Short Term Goal 2: Pt will perform 5x STS with UE support in < 17 seconds, indicating improvement in functional mobility.   Time Frame : 4 weeks   Short Term Goal 3: Pt will perform tandem stance > 10 seconds, indicating improved balance and reduced risk for falls   Time Frame : 4 weeks                          Long Term Goals:  Long Term Goal 1: Pt will demonstrate strength gain of +1 per MMT for every LE muscle graded < 5/5, indicating improved strength for functional mobility   Time Frame: 10 weeks   Long Term Goal 2: Pt will be able to stand up from a standard chair 5 times consecutively without use of UE support, indicating improvement in functional lower extremity muscle strength.    Time Frame: 10 weeks   Long Term Goal 3: Pt will perform TUG in < 12 seconds, indicating reduced risk for falls   Time Frame: 10 weeks  Prognosis:  Fair   Positive Prognosis Rationale: Behavior, Motivation   Negative Prognosis Rationale: Pain status, Endurance deficits, Medical status/condition, Multiple co-morbidities    PLAN:  2x week   for 8-10 weeks     Planned Interventions: Home exercise program, Education - Patient, Education - Family / caregiver, Functional mobility, Therapeutic exercise, Therapeutic activity, Neuromuscular re-education, Transfer training, Endurance activities, Postural re-education, Modalities          SUBJECTIVE:  Pt reports he has increased LE weakness and recurrent falls since a hospitalization 09/2016. Pt has had 3 falls - 2 occurred while ambulating, and 1 when pt was sitting EOB and slid off bed. Of the 2 falls during ambulation, one occurred when pt was ambulating from bathroom to the bedroom and his L LE gave out, and the other occurred in bathroom when rollator caught edge of rug and pt lost balance. Pt states that bathroom rug has since been removed.  In all instances the pt required assistance from grandson to get up from the floor after the fall. Pt denies injuries from falls. Pt states that he is independent with ADL's and is performing physical activity inside the home, walking for 10 minutes with rollator and performing LE exercises daily.     When asked At the present time, would you say that your health is excellent, very good, good, fair, or poor?, patient responded fair     Living Environment: Single story house (7 STE with L railing)    Lives With: Spouse, Other Grandson    Past Medical History:   Diagnosis Date   ??? Cancer (CMS-HCC)    ??? Cirrhosis (CMS-HCC)    ??? Diabetes (CMS-HCC)    ??? Hepatitis C 07/17/2012   ??? Liver disease    ??? Low back pain 07/17/2012   ??? Varices, esophageal (CMS-HCC)     Family History   Problem Relation Age of Onset   ??? Hypertension Mother    ??? Cirrhosis Neg Hx    ??? Liver cancer Neg Hx    ??? Anemia Neg Hx    ??? Cancer Neg Hx    ??? Diabetes Neg Hx    ??? Kidney disease Neg Hx    ??? Obesity Neg Hx    ??? Thyroid disease Neg Hx    ??? Osteoporosis Neg Hx    ??? Coronary artery disease Neg Hx       Past Surgical History:   Procedure Laterality Date   ??? ANKLE SURGERY     ??? BACK SURGERY     ??? BACK SURGERY     ??? CHG US GUIDE, TISSUE ABLATION N/A 01/22/2016    Procedure: ULTRASOUND GUIDANCE FOR, AND MONITORING OF, PARENCHYMAL TISSUE ABLATION;  Surgeon: Particia Nearing, MD;  Location: MAIN OR [facility];  Service: Transplant   ??? IR EMBOLIZATION ORGAN ISCHEMIA, TUMORS, INFAR  06/16/2016    IR EMBOLIZATION ORGAN ISCHEMIA, TUMORS, INFAR 06/16/2016 Ammie Dalton, MD IMG VIR H[facility]   ??? PR COLON CA SCRN NOT HI RSK IND N/A 02/27/2015    Procedure: COLOREC CNCR SCR;COLNSCPY NO;  Surgeon: Vonda Antigua, MD;  Location: GI PROCEDURES [facility]ORIAL [facility];  Service: Gastroenterology   ??? PR ENDOSCOPIC ULTRASOUND EXAM N/A 02/27/2015    Procedure: UGI ENDO; W/ENDO ULTRASOUND EXAM INCLUDES ESOPHAGUS, STOMACH, &/OR DUODENUM/JEJUNUM;  Surgeon: Vonda Antigua, MD;  Location: GI PROCEDURES MEMORIAL [facility];  Service: Gastroenterology   ??? PR ERCP STENT PLACEMENT BILIARY/PANCREATIC DUCT N/A 11/29/2016    Procedure: ENDOSCOPIC RETROGRADE CHOLANGIOPANCREATOGRAPHY (ERCP);  WITH PLACEMENT OF ENDOSCOPIC STENT INTO BILIARY OR PANCREATIC DUCT;  Surgeon: Chriss Driver, MD;  Location: GI PROCEDURES MEMORIAL [facility];  Service: Gastroenterology   ??? PR ERCP,W/REMOVAL STONE,BIL/PANCR DUCTS N/A 11/29/2016    Procedure: ERCP; W/ENDOSCOPIC RETROGRADE REMOVAL OF CALCULUS/CALCULI FROM BILIARY &/OR PANCREATIC DUCTS;  Surgeon: Chriss Driver, MD;  Location: GI PROCEDURES [facility];  Service: Gastroenterology   ??? PR INSER HEART TEMP PACER ONE CHMBR N/A 10/02/2016    Procedure: Tempoarary Pacemaker Insertion;  Surgeon: Meredith Leeds, MD;  Location: [facility];  Service: Cardiology   ??? PR LAP,DIAGNOSTIC ABDOMEN N/A 01/22/2016    Procedure: Laparoscopy, Abdomen, Peritoneum, & Omentum, Diagnostic, W/Wo Collection Specimen(S) By Brushing Or Washing;  Surgeon: Particia Nearing, MD;  Location: MAIN OR [facility];  Service: Transplant   ??? PR PLACE PERCUT GASTROSTOMY TUBE N/A 11/17/2016    Procedure: UGI ENDO; W/DIRECTED PLCMT PERQ GASTROSTOMY TUBE;  Surgeon: Cletis Athens, MD;  Location: GI PROC[facility]URES [facility];  Service: Gastroenterology   ??? PR TRACHEOSTOMY, PLANNED N/A 09/29/2016    Procedure: TRACHEOSTOMY PLANNED (SEPART PROC);  Surgeon: Katherina Mires, MD;  Location: MAIN OR [facility];  Service: Trauma   ??? PR TRANSCATH INSERT OR REPLACE LEADLESS PM VENTR N/A 10/11/2016    Procedure: Pacemaker Implant/Replace Leadless;  Surgeon: Meredith Leeds, MD;  Location: [facility];  Service: Cardiology   ??? PR TRANSPLANT LIVER,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    Procedure: Liver Allotransplantation; Orthotopic, Partial Or Whole, From Cadaver Or Living Donor, Any Age;  Surgeon: Doyce Loose, MD;  Location: MAIN OR [facility];  Service: Transplant   ??? PR TRANSPLANT,PREP DONOR LIVER, WHOLE N/A 09/15/2016    Procedure: Rogelia Boga Std Prep Cad Donor Whole Liver Gft Prior Tnsplnt,Inc Chole,Diss/Rem Surr Tissu Wo Triseg/Lobe Splt;  Surgeon: Doyce Loose, MD;  Location: MAIN OR [facility];  Service: Transplant   ??? PR UPPER GI ENDOSCOPY,BIOPSY N/A 07/17/2012    Procedure: UGI ENDOSCOPY; WITH BIOPSY, SINGLE OR MULTIPLE;  Surgeon: Alba Destine, MD;  Location: GI PROCEDURES MEMORIAL [facility];  Service: Gastroenterology   ??? PR UPPER GI ENDOSCOPY,DIAGNOSIS N/A 02/04/2014    Procedure: UGI ENDO, INCLUDE ESOPHAGUS, STOMACH, & DUODENUM &/OR JEJUNUM; DX W/WO COLLECTION SPECIMN, BY BRUSH OR WASH;  Surgeon: Wilburt Finlay, MD;  Location: GI PROCEDURES [facility];  Service: Gastroenterology   ??? PR UPPER GI ENDOSCOPY,LIGAT VARIX N/A 11/05/2013    Procedure: UGI ENDO; W/BAND LIG ESOPH &/OR GASTRIC VARICES;  Surgeon: Wilburt Finlay, MD;  Location: GI PROCEDURES [facility]OR[facility];  Service: Gastroenterology    No Known Allergies   Social History   Substance Use Topics   ??? Smoking status: Never Smoker   ??? Smokeless tobacco: Never Used      Comment: Smoked in high school for about 5 years.    ??? Alcohol use No    Current Outpatient Prescriptions   Medication Sig Dispense Refill   ??? acetaminophen (TYLENOL) 160 mg/5 mL solution Take 10.2 mL (325 mg total) by mouth every four (4) hours as needed for pain. 300 mL 0   ??? blood sugar diagnostic (FREESTYLE TEST) Strp by Other route Four (4) times a day (before meals and nightly). 100 each 11   ??? blood-glucose meter (GLUCOSE MONITORING KIT) kit Use as instructed 1 each 0   ??? capsaicin (ZOSTRIX) 0.025 % cream Apply topically Two (2) times a day. 60 g 0   ??? cholecalciferol, vitamin D3, 2,000 unit cap 1 capsule (2,000 Units total) by PEG Tube route daily.  30 capsule 11   ??? digoxin (LANOXIN) 125 mcg tablet 0.5 tablets (62.5 mcg total) by G-tube route daily. 15 tablet 11   ??? docusate (COLACE) 50 mg/5 mL liquid Take 100 mg by mouth two (2) times a day as needed.     ??? insulin ASPART (NOVOLOG FLEXPEN U-100 INSULIN) 100 unit/mL injection pen Inject 0.05 mL (5 Units total) under the skin Three (3) times a day before meals. 4 mL 11   ??? insulin ASPART (NOVOLOG FLEXPEN) 100 unit/mL injection pen Inject 2 Units under the skin Three (3) times a day before meals. per SSI-  151-200= 2 units,  201-250= 4 units, 251-300=add 6 units, 301-350=8 units, 351-400=10 units, >400 =12 units     ??? insulin glargine (LANTUS) 100 unit/mL (3 mL) injection pen Inject 0.12 mL (12 Units total) under the skin nightly. 4 mL 11   ??? lancets Misc 1 each by Miscellaneous route Four (4) times a day (before meals and nightly). 100 each 11   ??? magnesium oxide (MAG-OX) 400 mg (241.3 mg magnesium) tablet Take 1 tablet (400 mg total) by mouth Two (2) times a day. 120 tablet 11   ??? magnesium oxide-Mg AA chelate 133 mg Tab Take 1 tablet by mouth Two (2) times a day. Picking up today 60 tablet 0   ??? melatonin 5 mg cap Take 1 capsule by mouth nightly as needed.     ??? mycophenolate (CELLCEPT) 250 mg capsule Take 1 capsule (250 mg total) by mouth Two (2) times a day. 240 capsule 2   ??? NEORAL 100 mg capsule Take 2 capsules (200 mg total) by mouth Two (2) times a day. 120 capsule 11   ??? omeprazole (PRILOSEC) 20 MG capsule Take 1 capsule (20 mg total) by mouth daily. 30 capsule 1   ??? pen needle, diabetic (ULTICARE PEN NEEDLE) 32 gauge x 5/32 Ndle 1 each by Miscellaneous route Four (4) times a day (before meals and nightly). 90 each 3   ??? polyethylene glycol (GLYCOLAX) 17 gram/dose powder Take 17 g by mouth daily as needed.     ??? sildenafil, antihypertensive, (REVATIO) 20 mg tablet Take 2 tablets (40 mg total) by mouth Three (3) times a day. 180 tablet 11   ??? sulfamethoxazole-trimethoprim (BACTRIM,SEPTRA) 400-80 mg per tablet Take 1 tablet (80 mg of trimethoprim total) by mouth 3 (three) times a week. 27 tablet 0   ??? thiamine (B-1) 100 MG tablet Take 100 mg by mouth daily.     ??? traMADol (ULTRAM) 50 mg tablet Take 50 mg by mouth. Every 4 to 6 hours PRN     ??? warfarin (COUMADIN) 1 MG tablet 3 mg Tuesday, Thursday, Saturday, Sunday and 2 mg Monday, Wednesday, Friday. 72 tablet 11     No current facility-administered medications for this visit.         Recent Procedures/Tests/Findings:       Surgical Procedure?: Yes  Surgical Procedure: Fusion (Ankle and spine)     Surgical Procedure Date: 01/31/02  Wound: none      Red Flags: History of cancer    Pain: Yes  Location: thoracic to lumbar spine  Current Pain Level: 6 Max Pain Level: 6 Least Pain Level: 6  Pain Frequency: Constant/continuous  Pain aggravated by: walking, sitting, lying       Pain Descriptors: Aching, Sharp  Pain related behaviors: None       OBJECTIVE   Vital Signs:  Sitting: BP = 144/69 mmHg, HR = 68 bpm,  O2 = 96%    Special Cerebellar Tests:   F-N-F: NT  RAM (ankle DF): WNLs, symmetrical  H-S: NT    Visual/Vestibular Testing: not indicated    Cognitive Status/Communication: Patient is alert, oriented, and cooperative, and able to provide historical information. He has a weak, breathy voice, but his speech is easily intelligible.     Posture/Observations:   Sitting: rounded shoulders and forward head, pt demonstrated sacral sitting  Standing: forward flexed posture, rounded shoulders and forward head  Skin assessment/Edema: dry skin, good color and warmth      Sensation:   5.07 Semmes-Weinstein Monofilament: diminished bilateral LE  Impaired  R foot = 7/10, R LE (ankle to knee) = 7/10  L foot = 7/10, L LE (ankle to knee) = 7/10  Pt reports diminished sensation L > R     Proprioception:  Intact at the great toe bilaterally, with 9 out of 10 correct responses on the L and 10 out of 10 correct responses on the R.     Reflexes:   Not Tested    Range of Motion/Flexibility:   Within normal limits     Strength/MMT:   UE muscle strength is generally in the 4/5 range     LE MMT     LE MMT  Left  Right    Hip flex: (L2)  3+/5  4/5    Hip abd:  3-/5  3/5    Hip ext: -/5 -/5   Knee ext: (L3)  3+/5  4/5    Knee flex: (S2)  4/5  4/5    Ankle DF: (L4)  3+/5  3+/5    Ankle PF: (S1)  2+/5  2+/5      Functional Mobility:  Pt is independent in all bed mobility and transfers, but performs them slowly. Pt is unable to come to standing from standard chair independently without use of UEs for support.  Rolling: independent  Supine to sit: independent  Sit to supine: independent  Sit to stand: modified independent with use of bilateral UE support    Balance:   Patient able to maintain balance in sitting and in typical stance position independently without UE support. Able to maintain feet together and semi-tandem stance positions for >10 sec without UE support.    Gait Analysis:  decreased gait speed  decreased R step length  decreased R toe clearance   Pt demonstrated decreased bilateral push-off, heel strike R > L, and step length  Patient ambulates with rollator     Functional Test/Outcome Measures:  5XSTS: unable   Pt completed modified testing with 5 stands in 20.9 sec using B UE support    TUG: 18.6 seconds with rollator    Tandem Stance: 1-2 sec with L foot forward; 4.9 sec with R foot forward    SLS: unable      Treatment Rendered:    PT Evaluation:50 min   Therapeutic Exercise: 10 mins - Home Exercise Program; Pt given written instructions with diagrams for home program exercises including Standing marching in place  Bridging  Unilateral ankle DF in step-stance position  STS    Total Time: 60 minutes        Personal Factors/Comorbidities Present: 3+ Specific Comorbidities : Liver transplant, Hep C, heart failure, atrial fibrillation, DM, neuropathy, R ankle fusion, spinal fusion, vocal fold paralysis  Examination of body systems: 4+ elements  Body System: MSK, neuro, integumentary, cardiopulmonary Clinical Decision Making: Moderate        Total  Time: 69                        Education Provided: Role of therapy in Rehabiliation, HEP, importance of therapy, treatment options and plan, indications/contraindications to exercises, fall prevention  Education results: Verbalized understanding, Needs further instruction     Communication/Consultation: discussed POC with PT, medicare Cert/POC sent to referring practitioner    I attest that I have reviewed the above information.  Signed: Naaman Plummer  01/31/2017 5:44 PM     I was physically present and immediately available to direct and supervise tasks that were related to patient management. The direction and supervision were continuous throughout the time these tasks were performed.     Signed: Sedalia Muta, PT  01/31/2017 5:44 PM

## 2017-02-01 NOTE — Unmapped (Addendum)
Wife agreed to have patient repeat labs in one week per Dr. Matilde Haymaker on Monday.  Dr. Matilde Haymaker made aware of the elevated CSA.  Wife says it is reliable.  Wife agreed since patient is d/c'd from home health on Tuesday to go to [facility] on Monday and subsequently.  Confirmed with wife and home health she will get labs drawn at [facility] on Monday.  Asked about  in-office vocal fold injection by Dr. Sherryll Burger only lasting one month.  Let her know I am unsure but sent message to Dr. Sherryll Burger enquiring about this since wife said if it only lasts for month she does not want patient to go through the pain.     Received message back from Dr. Sherryll Burger and provided patient wife with the information:  A temporary injection is done it Lasts about 3 months. It is a well tolerated office procedure.   This is for pt. to get used to the voice and sometimes do some voice therapy with the injection  Once 9 months out from the transplant which will be about the same time the injection wears off, we would offer a permanent implant procedure which is in the OR under local anesthesia.   In addition, about 25% of people who do the temporary injection have a permanent voice improvement even though the material goes away and end up not needing the permanent implant. ??     Wife verbalized understanding. Sent message back to Dr. Sherryll Burger to make aware and to let her know normally we generally avoid procedures for the first year after transplant due to infection risk.  Offered to speak with surgeon to see if this would be the guidline for patient or if she would like me to wait.

## 2017-02-06 ENCOUNTER — Ambulatory Visit
Admission: RE | Admit: 2017-02-06 | Discharge: 2017-02-06 | Disposition: A | Discharge status: Home / Self Care | Payer: MEDICARE | Attending: Pharmacist Clinician (PhC)/ Clinical Pharmacy Specialist | Admitting: Pharmacist Clinician (PhC)/ Clinical Pharmacy Specialist

## 2017-02-06 ENCOUNTER — Ambulatory Visit: Admission: RE | Admit: 2017-02-06 | Discharge: 2017-02-06 | Disposition: A | Discharge status: Home / Self Care

## 2017-02-06 DIAGNOSIS — Z7901 Long term (current) use of anticoagulants: Secondary | ICD-10-CM

## 2017-02-06 DIAGNOSIS — K769 Liver disease, unspecified: Secondary | ICD-10-CM

## 2017-02-06 DIAGNOSIS — Z79899 Other long term (current) drug therapy: Secondary | ICD-10-CM

## 2017-02-06 DIAGNOSIS — R945 Abnormal results of liver function studies: Secondary | ICD-10-CM

## 2017-02-06 DIAGNOSIS — Z125 Encounter for screening for malignant neoplasm of prostate: Secondary | ICD-10-CM

## 2017-02-06 DIAGNOSIS — Z944 Liver transplant status: Principal | ICD-10-CM

## 2017-02-06 DIAGNOSIS — R935 Abnormal findings on diagnostic imaging of other abdominal regions, including retroperitoneum: Secondary | ICD-10-CM

## 2017-02-06 LAB — COMPREHENSIVE METABOLIC PANEL
ALBUMIN: 3.6 g/dL (ref 3.5–5.0)
ALBUMIN: 3.7 g/dL (ref 3.5–5.0)
ALKALINE PHOSPHATASE: 916 U/L — ABNORMAL HIGH (ref 38–126)
ALKALINE PHOSPHATASE: 906 U/L — ABNORMAL HIGH (ref 38–126)
ALT (SGPT): 126 U/L — ABNORMAL HIGH (ref 19–72)
ALT (SGPT): 125 U/L — ABNORMAL HIGH (ref 19–72)
ANION GAP: 13 mmol/L (ref 9–15)
ANION GAP: 14 mmol/L (ref 9–15)
AST (SGOT): 121 U/L — ABNORMAL HIGH (ref 19–55)
AST (SGOT): 109 U/L — ABNORMAL HIGH (ref 19–55)
BILIRUBIN TOTAL: 1 mg/dL (ref 0.0–1.2)
BILIRUBIN TOTAL: 1 mg/dL (ref 0.0–1.2)
BLOOD UREA NITROGEN: 33 mg/dL — ABNORMAL HIGH (ref 7–21)
BLOOD UREA NITROGEN: 34 mg/dL — ABNORMAL HIGH (ref 7–21)
BUN / CREAT RATIO: 20
BUN / CREAT RATIO: 23
CALCIUM: 9.9 mg/dL (ref 8.5–10.2)
CALCIUM: 9.9 mg/dL (ref 8.5–10.2)
CHLORIDE: 105 mmol/L (ref 98–107)
CHLORIDE: 103 mmol/L (ref 98–107)
CO2: 22 mmol/L (ref 22.0–30.0)
CREATININE: 1.5 mg/dL — ABNORMAL HIGH (ref 0.70–1.30)
EGFR MDRD AF AMER: 52 mL/min/{1.73_m2} — ABNORMAL LOW (ref >=60)
EGFR MDRD AF AMER: 56 mL/min/{1.73_m2} — ABNORMAL LOW (ref >=60)
EGFR MDRD NON AF AMER: 43 mL/min/{1.73_m2} — ABNORMAL LOW (ref >=60)
EGFR MDRD NON AF AMER: 47 mL/min/{1.73_m2} — ABNORMAL LOW (ref >=60)
GLUCOSE RANDOM: 121 mg/dL (ref 65–179)
POTASSIUM: 4.9 mmol/L (ref 3.5–5.0)
POTASSIUM: 4.7 mmol/L (ref 3.5–5.0)
PROTEIN TOTAL: 6.4 g/dL — ABNORMAL LOW (ref 6.5–8.3)
PROTEIN TOTAL: 6.6 g/dL (ref 6.5–8.3)
SODIUM: 140 mmol/L (ref 135–145)
SODIUM: 140 mmol/L (ref 135–145)

## 2017-02-06 LAB — THYROID STIMULATING HORMONE: Thyrotropin:ACnc:Pt:Ser/Plas:Qn:: 6.124 — ABNORMAL HIGH

## 2017-02-06 LAB — BILIRUBIN DIRECT
Bilirubin.glucuronidated:MCnc:Pt:Ser/Plas:Qn:: 0.5 — ABNORMAL HIGH
Bilirubin.glucuronidated:MCnc:Pt:Ser/Plas:Qn:: 0.5 — ABNORMAL HIGH

## 2017-02-06 LAB — PROSTATE SPECIFIC ANTIGEN: Prostate specific Ag:MCnc:Pt:Ser/Plas:Qn:: 1.49

## 2017-02-06 LAB — CBC W/ AUTO DIFF
BASOPHILS ABSOLUTE COUNT: 0 10*9/L (ref 0.0–0.1)
BASOPHILS ABSOLUTE COUNT: 0 10*9/L (ref 0.0–0.1)
EOSINOPHILS ABSOLUTE COUNT: 0.1 10*9/L (ref 0.0–0.4)
EOSINOPHILS ABSOLUTE COUNT: 0.1 10*9/L (ref 0.0–0.4)
HEMATOCRIT: 30.5 % — ABNORMAL LOW (ref 41.0–53.0)
HEMATOCRIT: 31.4 % — ABNORMAL LOW (ref 41.0–53.0)
HEMOGLOBIN: 10.3 g/dL — ABNORMAL LOW (ref 13.5–17.5)
LARGE UNSTAINED CELLS: 2 % (ref 0–4)
LARGE UNSTAINED CELLS: 1 % (ref 0–4)
LYMPHOCYTES ABSOLUTE COUNT: 1.2 10*9/L — ABNORMAL LOW (ref 1.5–5.0)
LYMPHOCYTES ABSOLUTE COUNT: 1.1 10*9/L — ABNORMAL LOW (ref 1.5–5.0)
MEAN CORPUSCULAR HEMOGLOBIN: 31.1 pg (ref 26.0–34.0)
MEAN CORPUSCULAR HEMOGLOBIN: 30.7 pg (ref 26.0–34.0)
MEAN CORPUSCULAR VOLUME: 92.4 fL (ref 80.0–100.0)
MEAN CORPUSCULAR VOLUME: 93.3 fL (ref 80.0–100.0)
MEAN PLATELET VOLUME: 8.1 fL (ref 7.0–10.0)
MONOCYTES ABSOLUTE COUNT: 0.3 10*9/L (ref 0.2–0.8)
MONOCYTES ABSOLUTE COUNT: 0.3 10*9/L (ref 0.2–0.8)
NEUTROPHILS ABSOLUTE COUNT: 3.5 10*9/L (ref 2.0–7.5)
NEUTROPHILS ABSOLUTE COUNT: 3.2 10*9/L (ref 2.0–7.5)
PLATELET COUNT: 160 10*9/L (ref 150–440)
PLATELET COUNT: 148 10*9/L — ABNORMAL LOW (ref 150–440)
RED BLOOD CELL COUNT: 3.3 10*12/L — ABNORMAL LOW (ref 4.50–5.90)
RED BLOOD CELL COUNT: 3.37 10*12/L — ABNORMAL LOW (ref 4.50–5.90)
RED CELL DISTRIBUTION WIDTH: 17.3 % — ABNORMAL HIGH (ref 12.0–15.0)
RED CELL DISTRIBUTION WIDTH: 17.1 % — ABNORMAL HIGH (ref 12.0–15.0)
WBC ADJUSTED: 5.2 10*9/L (ref 4.5–11.0)
WBC ADJUSTED: 4.7 10*9/L (ref 4.5–11.0)

## 2017-02-06 LAB — GAMMA GLUTAMYL TRANSFERASE
Gamma glutamyl transferase:CCnc:Pt:Ser/Plas:Qn:: 1083 — ABNORMAL HIGH
Gamma glutamyl transferase:CCnc:Pt:Ser/Plas:Qn:: 1121 — ABNORMAL HIGH

## 2017-02-06 LAB — MAGNESIUM
Magnesium:MCnc:Pt:Ser/Plas:Qn:: 1.7
Magnesium:MCnc:Pt:Ser/Plas:Qn:: 1.7

## 2017-02-06 LAB — RED CELL DISTRIBUTION WIDTH: Lab: 17.1 — ABNORMAL HIGH

## 2017-02-06 LAB — PHOSPHORUS
Phosphate:MCnc:Pt:Ser/Plas:Qn:: 3.3
Phosphate:MCnc:Pt:Ser/Plas:Qn:: 3.5

## 2017-02-06 LAB — CYCLOSPORINE, TROUGH: Lab: 151

## 2017-02-06 LAB — EGFR MDRD AF AMER
Glomerular filtration rate/1.73 sq M.predicted.black:ArVRat:Pt:Ser/Plas/Bld:Qn:Creatinine-based formula (MDRD): 52 — ABNORMAL LOW

## 2017-02-06 LAB — MICROCYTES

## 2017-02-06 LAB — FREE T4: Thyroxine.free:MCnc:Pt:Ser/Plas:Qn:: 1.66 — ABNORMAL HIGH

## 2017-02-06 LAB — BILIRUBIN TOTAL: Bilirubin:MCnc:Pt:Ser/Plas:Qn:: 1

## 2017-02-06 LAB — INR: Lab: 1.95

## 2017-02-06 MED ORDER — MAGNESIUM OXIDE-MAGNESIUM AMINO ACID CHELATE 133 MG TABLET
ORAL_TABLET | Freq: Two times a day (BID) | ORAL | 0 refills | 0 days
Start: 2017-02-06 — End: 2017-03-02

## 2017-02-06 MED ORDER — ACETAMINOPHEN 325 MG TABLET
ORAL_TABLET | ORAL | 2 refills | 0 days | PRN
Start: 2017-02-06 — End: 2018-04-05

## 2017-02-06 MED ORDER — INSULIN ASPART (U-100) 100 UNIT/ML (3 ML) SUBCUTANEOUS PEN
Freq: Three times a day (TID) | SUBCUTANEOUS | 11 refills | 0 days
Start: 2017-02-06 — End: 2018-04-05

## 2017-02-06 MED ORDER — MYCOPHENOLATE MOFETIL 250 MG CAPSULE: 500 mg | capsule | Freq: Two times a day (BID) | 3 refills | 0 days | Status: AC

## 2017-02-06 MED ORDER — MELATONIN 3 MG TABLET
ORAL_TABLET | Freq: Every evening | ORAL | 0 refills | 0 days
Start: 2017-02-06 — End: ?

## 2017-02-06 MED ORDER — MYCOPHENOLATE MOFETIL 250 MG CAPSULE
ORAL_CAPSULE | Freq: Two times a day (BID) | ORAL | 3 refills | 0.00000 days | Status: CP
Start: 2017-02-06 — End: 2017-03-16

## 2017-02-06 MED ORDER — WARFARIN 1 MG TABLET
ORAL_TABLET | Freq: Every day | ORAL | 11 refills | 0 days | Status: SS
Start: 2017-02-06 — End: 2017-03-01

## 2017-02-06 MED FILL — ON CALL EXPRESS BLOOD GLU/EXPRESS/STRP: ON CALL EXPRESS BLOOD GLU/EXPRESS/STRP | 25 days supply | Qty: 100 | Fill #1

## 2017-02-06 MED FILL — UNIFINE PENTIPS 32GX4MM/32GX4MM/MISC: UNIFINE PENTIPS 32GX4MM/32GX4MM/MISC | 25 days supply | Qty: 100 | Fill #1

## 2017-02-06 NOTE — Unmapped (Addendum)
[facility]PIT[facility] TRANSPLANT CLINIC PHARMACY NOTE  02/06/2017   Jeffrey Ward  [id number]    Medication changes today:   1. Stop magnesium oxide  2. Increase MG plus protein to 266 mg (2 tablets) BID  3. Increase insulin Lispro from 5 units with meals to 6 units with meals  4. Encourage patient to test BG at bedtime for mealtime insulin titration  5. Discontinue thiamine, miralax, and docusate from Epic medication list  6. Increase cellcept ot 500 mg bid.     Education/Adherence tools provided today:  1. Provided updated medication list   2. Provided assistance with pill box fill   3. Reviewed appropriate monitoring of blood sugars and education on insulin regimen  4. Provided education on immunosuppression side effects and dosing regimen    Follow up items:  1. Continue to assess wife's ability to fill pill box   2. Consider changing Cellcept to Myfortic if diarrhea does not resolve with conversion from magnesium oxide to MG plus protein.  3. Assess BG log with change in Lispro dose and new instructions to check QID before meals.    Next visit with pharmacy in 2 weeks.  ____________________________________________________________________    Jeffrey Ward is a 66 y.o. 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 Course was complicated by cardiogenic shock due to right heart failure, a fib/atrial flutter, pulmonary hypertension, AKI requiring CRRT (now resolved), s/p permanent pacemaker placement, PEG placement 11/17/16 for dysphagia.     Concern for rejection on 8/18, found to have mild-moderate intrahepatic biliary duct dilation. No acute rejection on liver biopsy on 11/04/16, but did receive steroids at this time for presumed rejection.     Other PMH significant for DM.    Seen by pharmacy today for: medication management and pill box fill and adherence; last seen by pharmacy 2 weeks ago    CC:  Patient complains of back pain, tingling in legs (particularly left leg)    Vitals:    02/06/17 0947   BP: 146/80   Pulse: 68   Temp: 36 ??C (96.8 ??F)       No Known Allergies  All medications reviewed and updated.   Medication list includes revisions made during today???s encounter    Outpatient Encounter Prescriptions as of 02/06/2017   Medication Sig Dispense Refill   ??? acetaminophen (TYLENOL) 325 MG tablet Take 1 tablet (325 mg total) by mouth every four (4) hours as needed for pain. 100 tablet 2   ??? blood sugar diagnostic (FREESTYLE TEST) Strp by Other route Four (4) times a day (before meals and nightly). 100 each 11   ??? blood-glucose meter (GLUCOSE MONITORING KIT) kit Use as instructed 1 each 0   ??? capsaicin (ZOSTRIX) 0.025 % cream Apply topically Two (2) times a day. 60 g 0   ??? cholecalciferol, vitamin D3, 2,000 unit cap 1 capsule (2,000 Units total) by PEG Tube route daily. 30 capsule 11   ??? digoxin (LANOXIN) 125 mcg tablet 0.5 tablets (62.5 mcg total) by G-tube route daily. 15 tablet 11   ??? insulin ASPART (NOVOLOG FLEXPEN U-100 INSULIN) 100 unit/mL injection pen Inject 0.06 mL (6 Units total) under the skin Three (3) times a day before meals. 4 mL 11   ??? insulin ASPART (NOVOLOG FLEXPEN) 100 unit/mL injection pen Inject 2 Units under the skin Three (3) times a day before meals. per SSI-  151-200= 2 units,  201-250= 4 units, 251-300=add 6 units, 301-350=8 units, 351-400=10 units, >400 =12 units     ???  insulin glargine (LANTUS) 100 unit/mL (3 mL) injection pen Inject 0.12 mL (12 Units total) under the skin nightly. 4 mL 11   ??? lancets Misc 1 each by Miscellaneous route Four (4) times a day (before meals and nightly). 100 each 11   ??? magnesium oxide-Mg AA chelate 133 mg Tab Take 2 tablets by mouth Two (2) times a day. Picking up today 60 tablet 0   ??? melatonin 3 mg Tab Take 1 tablet (3 mg total) by mouth nightly. 30 tablet 0   ??? NEORAL 100 mg capsule Take 2 capsules (200 mg total) by mouth Two (2) times a day. 120 capsule 11   ??? omeprazole (PRILOSEC) 20 MG capsule Take 1 capsule (20 mg total) by mouth daily. 30 capsule 1   ??? pen needle, diabetic (ULTICARE PEN NEEDLE) 32 gauge x 5/32 Ndle 1 each by Miscellaneous route Four (4) times a day (before meals and nightly). 90 each 3   ??? sildenafil, antihypertensive, (REVATIO) 20 mg tablet Take 2 tablets (40 mg total) by mouth Three (3) times a day. 180 tablet 11   ??? sulfamethoxazole-trimethoprim (BACTRIM,SEPTRA) 400-80 mg per tablet Take 1 tablet (80 mg of trimethoprim total) by mouth 3 (three) times a week. 27 tablet 0   ??? traMADol (ULTRAM) 50 mg tablet Take 50 mg by mouth. Every 4 to 6 hours PRN     ??? warfarin (COUMADIN) 1 MG tablet Take 3 tablets (3 mg total) by mouth daily with evening meal. 72 tablet 11   ??? [DISCONTINUED] acetaminophen (TYLENOL) 160 mg/5 mL solution Take 10.2 mL (325 mg total) by mouth every four (4) hours as needed for pain. 300 mL 0   ??? [DISCONTINUED] docusate (COLACE) 50 mg/5 mL liquid Take 100 mg by mouth two (2) times a day as needed.     ??? [DISCONTINUED] insulin ASPART (NOVOLOG FLEXPEN U-100 INSULIN) 100 unit/mL injection pen Inject 0.05 mL (5 Units total) under the skin Three (3) times a day before meals. 4 mL 11   ??? [DISCONTINUED] magnesium oxide (MAG-OX) 400 mg (241.3 mg magnesium) tablet Take 1 tablet (400 mg total) by mouth Two (2) times a day. 120 tablet 11   ??? [DISCONTINUED] magnesium oxide-Mg AA chelate 133 mg Tab Take 1 tablet by mouth Two (2) times a day. Picking up today 60 tablet 0   ??? [DISCONTINUED] melatonin 5 mg cap Take 1 capsule by mouth nightly as needed.     ??? [DISCONTINUED] mycophenolate (CELLCEPT) 250 mg capsule Take 1 capsule (250 mg total) by mouth Two (2) times a day. 240 capsule 2   ??? [DISCONTINUED] polyethylene glycol (GLYCOLAX) 17 gram/dose powder Take 17 g by mouth daily as needed.     ??? [DISCONTINUED] thiamine (B-1) 100 MG tablet Take 100 mg by mouth daily.     ??? [DISCONTINUED] warfarin (COUMADIN) 1 MG tablet 3 mg Tuesday, Thursday, Saturday, Sunday and 2 mg Monday, Wednesday, Friday. 72 tablet 11     No facility-administered encounter medications on file as of 02/06/2017.      Induction agent : steroids only     CURRENT IMMUNOSUPPRESSION:  Cyclosporine 200 mg PO BID, cyclosporine goal: 150 - 200  Cellcept 250 mg PO BID   Steroid free     Patient complaining of back pain and tingling in legs and continues to have ongoing diarrhea.    IMMUNOSUPPRESSION DRUG LEVELS:  Lab Results   Component Value Date    CYCLO 151 02/06/2017    CYCLO 304 01/31/2017  CYCLO 196 01/26/2017     CSA level is accurate 12 hour trough     Graft function: stable, LFTs remain elevated  DSA: ntd   Biopsies to date: 11/04/16 - no rejection indicated   WBC/ANC: wnl (4.7/3.2)    Plan: Continue to monitor. increasing Cellcept to 500 mg BID (protocol dose) of WBC/ANC remain wnl. Additionally, ongoing diarrhea is likely due to large doses of oral magnesium supplementation. If symptoms do not resolve with change to MG plus protein, can consider switching patient to Myfortic now that he is taking tablets PO.    ID prophylaxis:   CMV Status: D+/R+, moderate risk CMV prophylaxis: valganciclovir 450mg  daily x 3 months per protocol. Completed  No results found for: CMVCP  PCP Prophylaxis: bactrim SS 1 tab MWF x 6 months. Stop date 03/18/17.  Thrush: completed   Patient is tolerating infectious prophylaxis well, tolerated bactrim tablets  Plan: Continue Bactrim per protocol     CV Prophylaxis: asa 81mg  discontinued due to warfarin use   The 10-year ASCVD risk score Denman George DC Jr., et al., 2013) is: 30.5%  Statin therapy: indicated, consider initiating statin at future visit. Patient's LFTs remain high today, if resolved consider initiating atorvastatin 10 mg PO daily at future visit (maximum atorvastatin dose with concurrent cyclosporine is 20 mg per day).    BP: Goal < 140/90. Clinic vitals reported above  Home BP ranges:   AM BP AM HR   01/23/2017 136/62    01/24/2017 146/72    01/25/2017 153/79    01/26/2017 164/76    01/27/2017 130/66    01/28/2017 149/73 01/29/2017 145/73    01/30/2017 153/74 66   01/31/2017 134/80    02/01/2017 138/95 63   02/02/2017 142/72    02/03/2017 143/76 63   02/04/2017 108/76 72   02/05/2017 139/72 69     Current meds include: none at this time   Plan: within goal. Continue to monitor    Atrial fibrillations:  Medications currently on: digoxin 62.5 mcg daily and warfarin 3mg  qHS  (Patient was instructed to decrease warfarin dose to 2 mg MWF and 3 mg TTSS but did not decrease dose and has been taking 3 mg daily)  Digoxin level: 0.8 (10/31)  INR: 1.95 (11/26)  TSH: 8.26, Free T4: 1.98   Plan: continue warfarin 3mg  qHS, follow up with INR. Educated patient on vitamin K content in foods and keeping vitamin K intake relatively consistent.  Continue to monitor serum K and Mg and replace up to 4.0 and 2.0, respectively, to reduce risk of arrythmia     Pulmonary Hypertension:  Meds currently on: sildenafil 40 mg TID   Plan: Continue current sildenafil dose.     Anemia:  H/H:   Lab Results   Component Value Date    HGB 10.3 (L) 02/06/2017     Lab Results   Component Value Date    HCT 31.4 (L) 02/06/2017     Iron panel:  Lab Results   Component Value Date    IRON 169 (H) 11/24/2015    TIBC 190.8 (L) 11/24/2015    FERRITIN 286.0 11/24/2015     Lab Results   Component Value Date    LABIRON 89 (H) 11/24/2015     Prior ESA use: NTD  Plan: within goal. Continue to monitor.     DM:   Lab Results   Component Value Date    A1C 5.1 12/26/2016   Goal A1c < 7  History of Dm?  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 Pt has a primary care that is not currently primarily managing the BS at this time.  Currently on: Lantus 12 units qHS, Novolog 5 units with meals + sliding scale insulin   Home BS log:   Breakfast Lunch Dinner   01/23/2017 124 212 225   01/24/2017 89 278 210   01/25/2017 123 134 165   01/26/2017 102 136 215   01/27/2017 146 222 79   01/28/2017 177 364    01/29/2017 92 216    01/30/2017 103 210 212   01/31/2017 168 270 187   02/01/2017 139 240 124   02/02/2017 217 165 203   02/03/2017 163 245 221   02/04/2017 230 243 140   02/05/2017 161 145      Diet: has continued to improve  Hypoglycemia:No    Plan: Increase novolog to 5 units AC TID. Continue Lantus 12 units nightly. Requested bedtime BG for mealtime insulin titration.    Electrolytes: Magnesium is wnl (1.7) - level has been consistently low since transplant.  Meds currently on: Patient was confused with warfarin instructions and started taking: Mag oxide 800 mg BID TTSS and Mag protein 133 mg BID MWF  Plan: mag level is improved today, will continue to transition to Mag plus protein by discontinuing magnesium oxide today and starting Mag plus protein 266 mg (2 tablets) BID.    BM: Patient having 1 BM daily, consistency that of diarrhea. No reports of nausea/vomiting.   Meds currently on: None   Plan: Continue to monitor.    Pain: Patient complains of lower back pain which he treats with tylenol 3-4 times a week, tramadol ~ 2 x a week, and capcaicin cream. He feels these do not help with his pain. He states this is a chronic pain that he has had pre txp. Also complains of tingling and cold toes for which he uses a heating pad.  Of note, patient tried gabapentin 01/23/17 and did not tolerate it (he became weak and experienced repeated falls).  Meds currently on: Acetaminophen 325 mg PRN, tramadol 50mg  PRN, capcaicin cream (per endocrine)  Plan: Continue to monitor pain.    Bone health:   Vitamin D Level: last level 34.2 (12/26/16). Goal > 30.   Last DEXA results: 11/30/15, scan indicated low bone density   Current meds include: cholecalciferol 2,000 units daily   Plan: Vitamin D level within goal, continue supplementation. Continue to monitor.     Insomnia:   Meds currently on: melatonin 3 mg per night. Patient reports having to wake up to urinate frequently overnight. He admits to drinking water in the evening and bringing water to bed which he drinks when he wakes up throughout the night.This was the case prior to transplant. PSA 1.49 (wnl)  Plan: Encouraged patient to limit water consumption in the evening.    Women's/Men's Health:  Jeffrey Ward is a 66 y.o. male. Patient reports no men's health issues at this time.  PSA 11/26: 1.49  Plan: Continue to monitor    Adherence: Patient has fair understanding of medications; was not able to independently identify names/doses of immunosuppressants and OI meds. Patient's wife primarily manages medications. She has an improved understanding of his medications but continues to need assistance.   Patient does not fill their own pill box on a regular basis at home. Wife is now filling pillbox and it was filled correctly with the exception of the magnesium  oxide and magnesium protein (which were alternated every other day) instead of changing the warfarin dose. Warfarin was still filled as 3 mg daily.   Patient brought medication card: No  Pill box: Was incorrect (see above)  Patient requested refills for the following meds: None  Corrections needed in Epic medication list: Changed melatonin to 3 mg tablet to reflect home stock, changed acetaminophen to tablet to reflect home product.  Plan: Wife continues to be overwhelmed with the medications but has improved significantly. Continue to check pill box fills and wife's understanding of medications; provided moderate adherence counseling/intervention    Spent approximately 40 minutes on educating this patient and greater than 50% was spent in direct face to face counseling regarding post transplant medication education. Questions and concerns were address to patient's satisfaction.    Patient was reviewed with Dr. Rush Barer who was agreement with the stated plan:     During this visit, the following was completed:   BG log data assessment  BP log data assessment  Labs ordered and evaluated  Complex treatment plan > 1DS   Patient education was completed for 11-24 minutes     All questions/concerns were addressed to the patient's satisfaction.    Joycelyn Schmid, PharmD  PGY2 Solid Organ Transplant Resident  Pager: [phone]  __________________________________________  Noah Charon, PHARMD, CPP  SOLID ORGAN TRANSPLANT  PAGER [phone]

## 2017-02-06 NOTE — Unmapped (Signed)
Per Dr. Celine Mans MRCP to be scheduled in 2-3 days with repeat labs on Thursday.  Paten to increase Cellcept to 500 mg BID.  Provided patient education about appointments and MRCP procedure for about 5 minutes.

## 2017-02-06 NOTE — Unmapped (Signed)
Per PharmD Nedra Hai ordered STAT INR/PT and PSA for patient lab work and sent patient down to get labs redrawn.  Patient and wife stated they did not have further questions at this time.

## 2017-02-06 NOTE — Unmapped (Signed)
TRANSPLANT SURGERY PROGRESS NOTES    Assessment/Plan:    Patient for follow up after OLT. His LFTS are mildly elevated with significant elevation of GGT. Considering last MRI finding, we will do MRI/MRCP at the earliest for the need for interrogating bile duct. We will repeat labs this Thursday and assess the need for readmission of MRI not doe by then.  Also, need to follow up with ENT for procedure and concerns about tracheostomy site.    Subjective   Jeffrey Ward is a 66 y.o.  S/p liver transplant. ROS negative. He is feeling well.    Objective     Vitals:    02/06/17 0948   BP: 146/80   Pulse: 68   Temp: 36 ??C (96.8 ??F)   TempSrc: Tympanic   Weight: 62 kg (136 lb 9.6 oz)   Height: 162.2 cm (5' 3.86)      Body mass index is 23.55 kg/m??.     Physical Exam:    General Appearance:   No acute distress  Lungs:                Clear to auscultation bilaterally  Heart:                           Regular rate and rhythm  Abdomen:                Soft, non-tender, non-distended, scar well healed. Jaycee button in place.  Extremities:              Warm and well perfused        Data Review:  All lab results last 24 hours:    Recent Results (from the past 24 hour(s))   CBC w/ Differential    Collection Time: 02/06/17  9:29 AM   Result Value Ref Range    WBC 5.2 4.5 - 11.0 10*9/L    RBC 3.30 (L) 4.50 - 5.90 10*12/L    HGB 10.3 (L) 13.5 - 17.5 g/dL    HCT 14.7 (L) 82.9 - 53.0 %    MCV 92.4 80.0 - 100.0 fL    MCH 31.1 26.0 - 34.0 pg    MCHC 33.6 31.0 - 37.0 g/dL    RDW 56.2 (H) 13.0 - 15.0 %    MPV 8.1 7.0 - 10.0 fL    Platelet 160 150 - 440 10*9/L    Absolute Neutrophils 3.5 2.0 - 7.5 10*9/L    Absolute Lymphocytes 1.2 (L) 1.5 - 5.0 10*9/L    Absolute Monocytes 0.3 0.2 - 0.8 10*9/L    Absolute Eosinophils 0.1 0.0 - 0.4 10*9/L    Absolute Basophils 0.0 0.0 - 0.1 10*9/L    Large Unstained Cells 2 0 - 4 %    Microcytosis Slight (A) Not Present    Macrocytosis Slight (A) Not Present    Anisocytosis Slight (A) Not Present Hypochromasia Slight (A) Not Present   TSH    Collection Time: 02/06/17  9:30 AM   Result Value Ref Range    TSH 6.124 (H) 0.600 - 3.300 uIU/mL   T4, Free    Collection Time: 02/06/17  9:30 AM   Result Value Ref Range    Free T4 1.66 (H) 0.71 - 1.40 ng/dL   Comprehensive Metabolic Panel    Collection Time: 02/06/17  9:30 AM   Result Value Ref Range    Sodium 140 135 - 145 mmol/L    Potassium 4.9 3.5 -  5.0 mmol/L    Chloride 105 98 - 107 mmol/L    CO2 22.0 22.0 - 30.0 mmol/L    BUN 33 (H) 7 - 21 mg/dL    Creatinine 1.61 (H) 0.70 - 1.30 mg/dL    BUN/Creatinine Ratio 20     EGFR MDRD Non Af Amer 43 (L) >=60 mL/min/1.10m2    EGFR MDRD Af Amer 52 (L) >=60 mL/min/1.44m2    Anion Gap 13 9 - 15 mmol/L    Glucose 131 (H) 65 - 99 mg/dL    Calcium 9.9 8.5 - 09.6 mg/dL    Albumin 3.6 3.5 - 5.0 g/dL    Total Protein 6.4 (L) 6.5 - 8.3 g/dL    Total Bilirubin 1.0 0.0 - 1.2 mg/dL    AST 045 (H) 19 - 55 U/L    ALT 126 (H) 19 - 72 U/L    Alkaline Phosphatase 916 (H) 38 - 126 U/L   Bilirubin, Direct    Collection Time: 02/06/17  9:30 AM   Result Value Ref Range    Bilirubin, Direct 0.50 (H) 0.00 - 0.40 mg/dL   Phosphorus Level    Collection Time: 02/06/17  9:30 AM   Result Value Ref Range    Phosphorus 3.5 2.9 - 4.7 mg/dL   Magnesium Level    Collection Time: 02/06/17  9:30 AM   Result Value Ref Range    Magnesium 1.7 1.6 - 2.2 mg/dL   Gamma GT    Collection Time: 02/06/17  9:30 AM   Result Value Ref Range    GGT 1,121 (H) 12 - 109 U/L   PT-INR    Collection Time: 02/06/17 11:35 AM   Result Value Ref Range    PT 22.2 (H) 10.2 - 12.8 sec    INR 1.95    PSA, Diagnostic [facility])    Collection Time: 02/06/17 11:35 AM   Result Value Ref Range    PSA 1.49 0.00 - 4.00 ng/mL   Comprehensive Metabolic Panel    Collection Time: 02/06/17 11:35 AM   Result Value Ref Range    Sodium 140 135 - 145 mmol/L    Potassium 4.7 3.5 - 5.0 mmol/L    Chloride 103 98 - 107 mmol/L    CO2 23.0 22.0 - 30.0 mmol/L    BUN 34 (H) 7 - 21 mg/dL    Creatinine 4.09 (H) 0.70 - 1.30 mg/dL    BUN/Creatinine Ratio 23     EGFR MDRD Non Af Amer 47 (L) >=60 mL/min/1.78m2    EGFR MDRD Af Amer 56 (L) >=60 mL/min/1.38m2    Anion Gap 14 9 - 15 mmol/L    Glucose 121 65 - 179 mg/dL    Calcium 9.9 8.5 - 81.1 mg/dL    Albumin 3.7 3.5 - 5.0 g/dL    Total Protein 6.6 6.5 - 8.3 g/dL    Total Bilirubin 1.0 0.0 - 1.2 mg/dL    AST 914 (H) 19 - 55 U/L    ALT 125 (H) 19 - 72 U/L    Alkaline Phosphatase 906 (H) 38 - 126 U/L   Bilirubin, Direct    Collection Time: 02/06/17 11:35 AM   Result Value Ref Range    Bilirubin, Direct 0.50 (H) 0.00 - 0.40 mg/dL   Phosphorus Level    Collection Time: 02/06/17 11:35 AM   Result Value Ref Range    Phosphorus 3.3 2.9 - 4.7 mg/dL   Magnesium Level    Collection Time: 02/06/17 11:35 AM   Result  Value Ref Range    Magnesium 1.7 1.6 - 2.2 mg/dL   Gamma GT    Collection Time: 02/06/17 11:35 AM   Result Value Ref Range    GGT 1,083 (H) 12 - 109 U/L   CBC w/ Differential    Collection Time: 02/06/17 11:35 AM   Result Value Ref Range    WBC 4.7 4.5 - 11.0 10*9/L    RBC 3.37 (L) 4.50 - 5.90 10*12/L    HGB 10.3 (L) 13.5 - 17.5 g/dL    HCT 16.1 (L) 09.6 - 53.0 %    MCV 93.3 80.0 - 100.0 fL    MCH 30.7 26.0 - 34.0 pg    MCHC 32.9 31.0 - 37.0 g/dL    RDW 04.5 (H) 40.9 - 15.0 %    MPV 8.6 7.0 - 10.0 fL    Platelet 148 (L) 150 - 440 10*9/L    Absolute Neutrophils 3.2 2.0 - 7.5 10*9/L    Absolute Lymphocytes 1.1 (L) 1.5 - 5.0 10*9/L    Absolute Monocytes 0.3 0.2 - 0.8 10*9/L    Absolute Eosinophils 0.1 0.0 - 0.4 10*9/L    Absolute Basophils 0.0 0.0 - 0.1 10*9/L    Large Unstained Cells 1 0 - 4 %    Macrocytosis Slight (A) Not Present    Anisocytosis Slight (A) Not Present    Hypochromasia Slight (A) Not Present         Imaging: None  No images are attached to the encounter.

## 2017-02-06 NOTE — Unmapped (Signed)
Called [place] with MRI to confirm patient has Pacemaker and needs a MRCP in 2-3 days.  Confirmed they can screen for [facility] also placed order with this comment.  Confirmed appoinmnet scheduled on Thursday 02/09/17 at at 11 am.  Confirmed patient is to be NPO 6 hours prior to MRI.  Wife verbalized understanding.

## 2017-02-06 NOTE — Unmapped (Signed)
Addended by: Mariea Stable A on: 02/06/2017 03:02 PM     Modules accepted: Orders

## 2017-02-06 NOTE — Unmapped (Signed)
duplicate

## 2017-02-07 DIAGNOSIS — G6289 Other specified polyneuropathies: Principal | ICD-10-CM

## 2017-02-07 DIAGNOSIS — Z9181 History of falling: Secondary | ICD-10-CM

## 2017-02-07 DIAGNOSIS — Z7409 Other reduced mobility: Secondary | ICD-10-CM

## 2017-02-07 DIAGNOSIS — R29898 Other symptoms and signs involving the musculoskeletal system: Secondary | ICD-10-CM

## 2017-02-07 NOTE — Unmapped (Signed)
Patient request for RX refill.

## 2017-02-07 NOTE — Unmapped (Signed)
**  Dose Change for Mycophenolate   Previous RX on 11/05: 1T BID  New RX on 11/27: 2T BID    Co-pay = $0

## 2017-02-07 NOTE — Unmapped (Signed)
[facility] Refill and Clinical Coordination Note  Medication(s): MYCOPHENOLATE, NEORAL    Jeffrey Ward, DOB: [birth year]  Phone: [phone] (home) , Alternate phone contact: N/A  Shipping address: [address]  [place] [address]. Send bills to PO BOX - send meds to physical address. (added note about this in [facility])  Phone or address changes today?: No  All above HIPAA information verified.  Insurance changes? No    Completed refill and clinical call assessment today to schedule patient's medication shipment from the [facility] [phone]).      MEDICATION RECONCILIATION    Confirmed the medication and dosage are correct and have not changed: No, patient reports changes to the regimen as follows: now on mycophenolate 250mg  2bid - this is a dose inc. New rx was sent to [place] today.    Were there any changes to your medication(s) in the past month:  Reports stopping the following medications: wife states amiodarone was DC awhile ago, gabapentin was DC recently.    ADHERENCE    Is this medicine transplant or covered by Medicare Part B? Yes.    Mycophenolate Mofetil 250 mg   Quantity filled last month: 60   # of tablets left on hand: 14    Neoral 100 mg   Quantity filled last month: 120   # of tablets left on hand: 40      Did you miss any doses in the past 4 weeks? No missed doses reported.  Adherence counseling provided? Not needed     SIDE EFFECT MANAGEMENT    Are you tolerating your medication?:  Jeffrey Ward reports tolerating the medication.  Side effect management discussed: None      Therapy is appropriate and should be continued.    Evidence of clinical benefit: See Epic note from 01/23/17      FINANCIAL/SHIPPING    Delivery Scheduled: Yes, Expected medication delivery date: 02/10/17   Additional medications refilled: sildenafil - 3rxs will be sent total    Cassandra did not have any additional questions at this time.    Delivery address validated in [facility] scheduling system: Yes, address listed above is correct.      We will follow up with patient monthly for standard refill processing and delivery.      Thank you,  Thad Ranger   [facility] Specialty Pharmacist

## 2017-02-08 NOTE — Unmapped (Signed)
[facility] ALLIED HEALTH PT [facility]  OUTPATIENT PHYSICAL THERAPY  Treatment Note  02/07/2017    Patient Name: Jeffrey Ward  Date of Birth:10-24-1950  Session Number: 2  Diagnosis:   Encounter Diagnoses   Name Primary?   ??? Other polyneuropathy    ??? Weakness of both lower extremities    ??? Impaired functional mobility, balance, gait, and endurance Yes   ??? Risk for falls        Onset of Symptoms: 10/31/16  Date of Evaluation: 01/31/17     Dates of Certification: 01/31/17 - 04/11/17    Chief Complaint/Reason for Referral: balance and LE strength  Problem List: Decreased strength, Decreased range of motion, Decreased endurance, Impaired balance, Decreased mobility, Impaired sensation, Pain, Fall Risk, Gait deviation    ASSESSMENT:    Assessment: Pt is a 66 year-old male being seen for recurrent falls and deconditioning secondary to a hospitalization following a liver transplant (09/2016). Pt's PMHx includes: h/o CA, peripheral neuropathy, hepatitis C, and heart failure. Pt presents this date demonstrating improved functional mobility, as he performed 5xSTS without UE support, ambulated 4' without use of AD, and showed improved exercise tolerance. Pt responded well to strengthening actvities in supine and static balance activities in standing. Pt demonstrated independence with HEP and negotiated stairs safely with CGA. Pt should benefit from skilled physical therapy to address LE weakness, balance and gait impairment, endurance, and safety to reduce the risk for falls.       Short Term Goals:  Patient/Family Goals: Improve strength, Improve ambulation, Improve balance    Short Term Goal 1: Pt will demonstrate independence in performance of HEP x 1 in clinic to maximize benefits of PT.  Time Frame : 4 weeks   Short Term Goal 2: Pt will perform 5x STS with UE support in < 17 seconds, indicating improvement in functional mobility. GOAL MET (02/07/17)   Time Frame : 4 weeks   Short Term Goal 3: Pt will perform tandem stance > 10 seconds, indicating improved balance and reduced risk for falls GOAL MET (02/07/17)   Time Frame : 4 weeks                          Long Term Goals:  Long Term Goal 1: Pt will demonstrate strength gain of +1 per MMT for every LE muscle graded < 5/5, indicating improved strength for functional mobility   Time Frame: 10 weeks   Long Term Goal 2: Pt will be able to stand up from a standard chair 5 times consecutively without use of UE support, indicating improvement in functional lower extremity muscle strength. GOAL MET (02/07/17) Revised Goal: Pt will demonstrate performance of 5x STS in < 12 sec, indicating improved functional mobility and reduced risk for falls.   Time Frame: 10 weeks   Long Term Goal 3: Pt will perform TUG in < 12 seconds, indicating reduced risk for falls   Time Frame: 10 weeks                         PLAN:  2x week   for Duration: 8-10 weeks    Next Visit Plan: Side stepping, side-lying hip ABD, dynamic balance, gait speed over longer distance; review HEP    SUBJECTIVE:  Subjective : Pt reports he is doing fairly well and has been performing exercises as prescribed. Pt notes bridging exercise is most difficult. Pt denies falls since last visit, and he  and his wife (present during visit) state they have seen improvement in his mobility from these past 2 sessions.  Precautions: Fall risk, Cancer history, Other  Other - Specify:: G-tube    OBJECTIVE:      Pain: Yes  Location: thoracic to lumbar spine  Current Pain Level: 6 Max Pain Level: 6 Least Pain Level: 6  Pain Frequency: Constant/continuous  Pain aggravated by: walking, sitting, lying         Pain Descriptors: Aching, Sharp  Pain related behaviors: None      Vital Signs:  Sitting: BP = 144/69 mmHg, HR = 68 bpm, O2 = 96%  On 02/07/17: Sitting after 15 minutes of activity = 137/116 mmHg, HR = 82 bpm (69 bpm at rest), 98% O2  Pt demonstrated SOB and fatigue during functional mobility, O2 Sat remained > 98% throughout session  ??  Special Cerebellar Tests:   F-N-F: NT  RAM (ankle DF): WNLs, symmetrical  H-S: NT  ??  Visual/Vestibular Testing: not indicated  ??  Cognitive Status/Communication: Patient is alert, oriented, and cooperative, and able to provide historical information. He has a weak, breathy voice, but his speech is easily intelligible.   ??  Posture/Observations:   Sitting: rounded shoulders and forward head, pt demonstrated sacral sitting  Standing: forward flexed posture, rounded shoulders and forward head  Skin assessment/Edema: dry skin, good color and warmth  ??  ??  Sensation:   5.07 Semmes-Weinstein Monofilament: diminished bilateral LE  Impaired  R foot = 7/10, R LE (ankle to knee) = 7/10  L foot = 7/10, L LE (ankle to knee) = 7/10  Pt reports diminished sensation L > R   ??  Proprioception:  Intact at the great toe bilaterally, with 9 out of 10 correct responses on the L and 10 out of 10 correct responses on the R.   ??  Reflexes:   Not Tested  ??  Range of Motion/Flexibility:   Within normal limits   ??  Strength/MMT:   UE muscle strength is generally in the 4/5 range   ??  LE MMT   ??  LE MMT  Left  Right    Hip flex: (L2)  3+/5  4/5    Hip abd:  3-/5  3/5    Hip ext: -/5 -/5   Knee ext: (L3)  3+/5  4/5    Knee flex: (S2)  4/5  4/5    Ankle DF: (L4)  3+/5  3+/5    Ankle PF: (S1)  2+/5  2+/5    ??  Functional Mobility:  Pt is independent in all bed mobility and transfers, but performs them slowly. Pt is unable to come to standing from standard chair independently without use of UEs for support.  Rolling: independent  Supine to sit: independent  Sit to supine: independent  Sit to stand: modified independent with use of bilateral UE support  ??  Balance:   Patient able to maintain balance in sitting and in typical stance position independently without UE support. Able to maintain feet together and semi-tandem stance positions for >10 sec without UE support.  ??  Gait Analysis:  decreased gait speed  decreased R step length  decreased R toe clearance   Pt demonstrated decreased bilateral push-off, heel strike R > L, and step length  Patient ambulates with rollator   ??  Functional Test/Outcome Measures:  5XSTS: unable, on 02/07/17: 15.6 sec without UE support  Pt completed modified testing with 5 stands in  20.9 sec using B UE support  ??  TUG: 18.6 seconds with rollator  ??  Tandem Stance: 1-2 sec with L foot forward; 4.9 sec with R foot forward, on 02/07/17: L foot forward = 14.2 sec, R foot forward = 27 sec  ??  SLS: unable, on 02/07/17: L foot = 1-2 sec, R foot = 4.6 sec  ??  Gait speed on 02/07/17: 0.71 m/s, pt performed with rollator on flat, even surface across 20' with supervision      Education Provided: Family, HEP, importance of therapy, treatment options and plan, back care, indications/contraindications to exercises  Education results: Verbalized understanding, Needs further instruction       Total Time: 60    Today's Charges:  Treatment Rendered: 60 minutes  Therapeutic Activity: 45 minutes (3 units)  - STS 3x5 with CGA and without UE support; best trial 15.6 seconds  - Standing marching in place 3x10; pt required heavy unilateral UE support and 2 seated rest breaks to complete  - Unilateral ankle DF in step-stance position 2x10 bilaterally; pt performed 1x10 with unilateral UE support and 1x10 without to increase difficulty and challenge dynamic balance  - Bridges 2x10 and 1x10 with 3 second isometric hold   - Leg raise 3x10 R LE, performed in supine with L LE in hook-lying position; pt performed a few reps on L LE but was too painful (Added to HEP - R LE only)  - Quad set 1x10 with 3 second isometric hold performed on L LE to reduce LBP (Added to HEP)  - Side-lying hip abduction 3x10 with moderate verbal and tactile cueing for correct form  - Bed mobility x 2   - Stairs 2x5 (clinic staircase) with unilateral UE support; pt required CGA and demonstrated safety performing without AD    Neuromuscular Re-education: 15 minutes (1 unit)  - Ambulation 6 x 10' without AD, with verbal cueing to increase step length; pt demonstrated one self-recovered LOB, demonstrated increased stance time on R LE compared to L LE  - Backwards ambulation 4 x 10' without AD; pt appropriately challenged with this activity and required CGA/MinA  - Toe taps onto 6 stair 2x10 bilaterally to promote SLS  - Tandem stance 3x bilaterally; pt responded very well to cues to focus on visual target and to correct posture and increase knee flexion  - SLS 3x bilaterally; pt demonstrated improved ability since last visit, but is limited d/t hip flexor weakness and fatigue                        I attest that I have reviewed the above information.  Signed: Naaman Plummer  02/07/2017 9:39 PM     I was physically present and immediately available to direct and supervise tasks that were related to patient management. The direction and supervision were continuous throughout the time these tasks were performed.     Signed: Sedalia Muta, PT  02/07/2017 10:02 PM

## 2017-02-09 ENCOUNTER — Ambulatory Visit
Admission: RE | Admit: 2017-02-09 | Discharge: 2017-02-09 | Disposition: A | Discharge status: Home / Self Care | Payer: MEDICARE

## 2017-02-09 DIAGNOSIS — K769 Liver disease, unspecified: Secondary | ICD-10-CM

## 2017-02-09 DIAGNOSIS — Z8505 Personal history of malignant neoplasm of liver: Secondary | ICD-10-CM

## 2017-02-09 DIAGNOSIS — R945 Abnormal results of liver function studies: Secondary | ICD-10-CM

## 2017-02-09 DIAGNOSIS — Z944 Liver transplant status: Principal | ICD-10-CM

## 2017-02-09 DIAGNOSIS — Z79899 Other long term (current) drug therapy: Secondary | ICD-10-CM

## 2017-02-09 LAB — COMPREHENSIVE METABOLIC PANEL
ALKALINE PHOSPHATASE: 834 U/L — ABNORMAL HIGH (ref 38–126)
ALT (SGPT): 114 U/L — ABNORMAL HIGH (ref 19–72)
ANION GAP: 9 mmol/L (ref 9–15)
AST (SGOT): 69 U/L — ABNORMAL HIGH (ref 19–55)
BILIRUBIN TOTAL: 0.9 mg/dL (ref 0.0–1.2)
BUN / CREAT RATIO: 22
CALCIUM: 9.8 mg/dL (ref 8.5–10.2)
CHLORIDE: 105 mmol/L (ref 98–107)
CO2: 25 mmol/L (ref 22.0–30.0)
CREATININE: 1.6 mg/dL — ABNORMAL HIGH (ref 0.70–1.30)
EGFR MDRD AF AMER: 52 mL/min/{1.73_m2} — ABNORMAL LOW (ref >=60)
EGFR MDRD NON AF AMER: 43 mL/min/{1.73_m2} — ABNORMAL LOW (ref >=60)
GLUCOSE RANDOM: 131 mg/dL (ref 65–179)
POTASSIUM: 4.6 mmol/L (ref 3.5–5.0)
PROTEIN TOTAL: 6.4 g/dL — ABNORMAL LOW (ref 6.5–8.3)
SODIUM: 139 mmol/L (ref 135–145)

## 2017-02-09 LAB — CBC W/ AUTO DIFF
EOSINOPHILS ABSOLUTE COUNT: 0.1 10*9/L (ref 0.0–0.4)
HEMATOCRIT: 34.1 % — ABNORMAL LOW (ref 41.0–53.0)
LARGE UNSTAINED CELLS: 3 % (ref 0–4)
LYMPHOCYTES ABSOLUTE COUNT: 1.2 10*9/L — ABNORMAL LOW (ref 1.5–5.0)
MEAN CORPUSCULAR HEMOGLOBIN CONC: 33 g/dL (ref 31.0–37.0)
MEAN CORPUSCULAR HEMOGLOBIN: 31.4 pg (ref 26.0–34.0)
MEAN CORPUSCULAR VOLUME: 95.1 fL (ref 80.0–100.0)
MEAN PLATELET VOLUME: 8.7 fL (ref 7.0–10.0)
MONOCYTES ABSOLUTE COUNT: 0.2 10*9/L (ref 0.2–0.8)
NEUTROPHILS ABSOLUTE COUNT: 2.7 10*9/L (ref 2.0–7.5)
PLATELET COUNT: 122 10*9/L — ABNORMAL LOW (ref 150–440)
RED BLOOD CELL COUNT: 3.58 10*12/L — ABNORMAL LOW (ref 4.50–5.90)
RED CELL DISTRIBUTION WIDTH: 17.3 % — ABNORMAL HIGH (ref 12.0–15.0)
WBC ADJUSTED: 4.4 10*9/L — ABNORMAL LOW (ref 4.5–11.0)

## 2017-02-09 LAB — PLATELET COUNT: Lab: 122 — ABNORMAL LOW

## 2017-02-09 LAB — POTASSIUM: Potassium:SCnc:Pt:Ser/Plas:Qn:: 4.6

## 2017-02-09 LAB — PHOSPHORUS: Phosphate:MCnc:Pt:Ser/Plas:Qn:: 3.3

## 2017-02-09 LAB — BILIRUBIN DIRECT: Bilirubin.glucuronidated:MCnc:Pt:Ser/Plas:Qn:: 0.5 — ABNORMAL HIGH

## 2017-02-09 LAB — CYCLOSPORINE, TROUGH: Lab: 223

## 2017-02-09 LAB — GAMMA GLUTAMYL TRANSFERASE: Gamma glutamyl transferase:CCnc:Pt:Ser/Plas:Qn:: 998 — ABNORMAL HIGH

## 2017-02-09 LAB — MAGNESIUM: Magnesium:MCnc:Pt:Ser/Plas:Qn:: 1.8

## 2017-02-09 MED FILL — WARFARIN/3MG/TAB: WARFARIN/3MG/TAB | 30 days supply | Qty: 30 | Fill #1

## 2017-02-09 MED FILL — NEORAL/100MG/CAP: NEORAL/100MG/CAP | 30 days supply | Qty: 120 | Fill #0

## 2017-02-09 MED FILL — MYCOPHENOLATE MOFETIL/250MG/CAPS: MYCOPHENOLATE MOFETIL/250MG/CAPS | 30 days supply | Qty: 120 | Fill #0

## 2017-02-09 NOTE — Unmapped (Signed)
GENERATOR INFORMATION  Manufacter & Model #Device: Micra VR TCP  SN#: [id number]      LEAD INFORMATION  Abandoned/Epicardical Lead(s)   NO        TESTING:       Sensing (mV):        RV PRE:  14.9 POST: 13.1    Capture Threshold (V@_ms ):      RV PRE:0.5 V @ 0.24 ms POST: 0.5V @ 0.24 ms            Impedance (between 200-1500ohms):       RV PRE:  590 POST: 580              MR CONDITIONAL STATUS AND MANAGEMENT    MR CONDITIONAL SYSTEM    (Including Generator and all leads)  Risks and Potential Complications discussed with patient and patient consents to procedure  System has been implanted more than 6 weeks? Yes   Pacing-Dependent: No   (Dependent if underlying rhythm is less than 40 bpm, symptoms of presyncope, lightheadedness, or hemodynamic instability noted in the upright or supine position when tested)  VS: 70        Programming Changes During MRI  Pacemaker Dependent:  NO  CIED was programmed to MRI sure scan during MRI.      SETTINGS RESTORED TO PREVIOUS POST SCAN    Follow-up advised in 3-6 months after MRI.

## 2017-02-10 ENCOUNTER — Ambulatory Visit
Admission: RE | Admit: 2017-02-10 | Discharge: 2017-02-10 | Disposition: A | Discharge status: Home / Self Care | Payer: MEDICARE | Attending: Anesthesiology | Admitting: Anesthesiology

## 2017-02-10 ENCOUNTER — Ambulatory Visit
Admission: RE | Admit: 2017-02-10 | Discharge: 2017-02-10 | Disposition: A | Discharge status: Home / Self Care | Payer: MEDICARE

## 2017-02-10 ENCOUNTER — Ambulatory Visit
Admission: RE | Admit: 2017-02-10 | Discharge: 2017-02-10 | Disposition: A | Discharge status: Home / Self Care | Payer: MEDICARE | Attending: Gastroenterology | Admitting: Gastroenterology

## 2017-02-10 DIAGNOSIS — R932 Abnormal findings on diagnostic imaging of liver and biliary tract: Principal | ICD-10-CM

## 2017-02-10 MED ORDER — NEORAL 100 MG CAPSULE
Freq: Two times a day (BID) | ORAL | 11 refills | 0.00000 days | Status: CP
Start: 2017-02-10 — End: 2017-02-10

## 2017-02-10 MED ORDER — NEORAL 100 MG CAPSULE: 100 mg | capsule | Freq: Two times a day (BID) | 11 refills | 0 days | Status: AC

## 2017-02-10 MED ORDER — NEORAL 25 MG CAPSULE
Freq: Two times a day (BID) | ORAL | 11 refills | 0.00000 days | Status: CP
Start: 2017-02-10 — End: 2017-02-10

## 2017-02-10 MED ORDER — AMOXICILLIN 875 MG-POTASSIUM CLAVULANATE 125 MG TABLET
ORAL_TABLET | Freq: Two times a day (BID) | ORAL | 0 refills | 0.00000 days | Status: CP
Start: 2017-02-10 — End: 2017-06-22

## 2017-02-10 MED ORDER — NEORAL 25 MG CAPSULE: 75 mg | capsule | Freq: Two times a day (BID) | 11 refills | 0 days | Status: AC

## 2017-02-10 NOTE — Unmapped (Signed)
Reached patient's wife post ERCP procedure by phone and she confirms that script was sent in for antibiotics to local pharmacy for 5 days (local pharmacy confirms receipt).  Reviewed with her previous coordinator had attempted to contact her regarding dosing change of Neoral.  Reviewed @ length ordered dosing change to 175.mg BID.  Reviewed dosing specifically this would be taking one 100.mg cap and three 25.mg caps twice daily to create this dosing.  Had wife read back and verify these instructions - she confirms they have 25.mg caps in supply @ home.  Referred her to previous coordinator detailed voicemail to review dosing changes again when she arrives home and suggested she contact on call coordinator should she need clarification - she verbalized understanding.

## 2017-02-10 NOTE — Unmapped (Signed)
Patient's csa level elevated above goal. Reviewed labs/recent ercp with txp pharmacist, Dr. Nedra Hai who recommended reducing his dose to 175mg  bid. Noted in chart that patient had previously been taking 25mg  caps. Left detailed VM explaining change in dosage and request to call substitute coordinator to confirm receipt of message.

## 2017-02-10 NOTE — Unmapped (Addendum)
Call received from Dr. Celine Mans requesting patient be either urgently scheduled for outpt ERCP or admitted to [facility] for ERCP over the weekend.    Call placed to Dr Georges Mouse who states outpatient ERCP can be scheduled for Friday - Per Charge RN patient to arrive @ 1100 for 12 pm procedure    Calls placed to all available contact #'s in patient chart - VM's left to urgently return coordinator call.     Of note # ending in [id number] is incorrect and was subsequently removed form patient demographic    1800 - Return call to Jeffrey Ward  and spoke with his wife.  Discussed recent MRI results and need for ERCP per Dr. Celine Mans. [name] stated clear understanding of need to be NPO after midnight , arrive @ [facility] by 1100, and to wear a mask d/t onsite construction.  Patient instructed to take anti- rejection medications @ scheduled time of 0900 with one small sip of water only. [name] stated understanding.

## 2017-02-14 ENCOUNTER — Ambulatory Visit
Admission: RE | Admit: 2017-02-14 | Discharge: 2017-02-14 | Disposition: A | Discharge status: Home / Self Care | Payer: MEDICARE

## 2017-02-14 DIAGNOSIS — R29898 Other symptoms and signs involving the musculoskeletal system: Principal | ICD-10-CM

## 2017-02-14 DIAGNOSIS — Z9181 History of falling: Secondary | ICD-10-CM

## 2017-02-14 DIAGNOSIS — K769 Liver disease, unspecified: Secondary | ICD-10-CM

## 2017-02-14 DIAGNOSIS — Z7409 Other reduced mobility: Secondary | ICD-10-CM

## 2017-02-14 DIAGNOSIS — Z944 Liver transplant status: Principal | ICD-10-CM

## 2017-02-14 DIAGNOSIS — G6289 Other specified polyneuropathies: Secondary | ICD-10-CM

## 2017-02-14 DIAGNOSIS — Z79899 Other long term (current) drug therapy: Secondary | ICD-10-CM

## 2017-02-14 LAB — COMPREHENSIVE METABOLIC PANEL
ALBUMIN: 3.9 g/dL (ref 3.5–5.0)
ALKALINE PHOSPHATASE: 439 U/L — ABNORMAL HIGH (ref 38–126)
ALT (SGPT): 34 U/L (ref 19–72)
ANION GAP: 8 mmol/L — ABNORMAL LOW (ref 9–15)
AST (SGOT): 18 U/L — ABNORMAL LOW (ref 19–55)
BILIRUBIN TOTAL: 0.7 mg/dL (ref 0.0–1.2)
BLOOD UREA NITROGEN: 42 mg/dL — ABNORMAL HIGH (ref 7–21)
BUN / CREAT RATIO: 23
CALCIUM: 10 mg/dL (ref 8.5–10.2)
CHLORIDE: 106 mmol/L (ref 98–107)
CO2: 24 mmol/L (ref 22.0–30.0)
CREATININE: 1.84 mg/dL — ABNORMAL HIGH (ref 0.70–1.30)
EGFR MDRD AF AMER: 45 mL/min/{1.73_m2} — ABNORMAL LOW (ref >=60)
EGFR MDRD NON AF AMER: 37 mL/min/{1.73_m2} — ABNORMAL LOW (ref >=60)
GLUCOSE RANDOM: 129 mg/dL (ref 65–179)
PROTEIN TOTAL: 6.7 g/dL (ref 6.5–8.3)
SODIUM: 138 mmol/L (ref 135–145)

## 2017-02-14 LAB — CBC W/ AUTO DIFF
BASOPHILS ABSOLUTE COUNT: 0 10*9/L (ref 0.0–0.1)
EOSINOPHILS ABSOLUTE COUNT: 0.2 10*9/L (ref 0.0–0.4)
HEMATOCRIT: 32.9 % — ABNORMAL LOW (ref 41.0–53.0)
HEMOGLOBIN: 11 g/dL — ABNORMAL LOW (ref 13.5–17.5)
LARGE UNSTAINED CELLS: 2 % (ref 0–4)
MEAN CORPUSCULAR HEMOGLOBIN CONC: 33.3 g/dL (ref 31.0–37.0)
MEAN CORPUSCULAR HEMOGLOBIN: 31.7 pg (ref 26.0–34.0)
MEAN CORPUSCULAR VOLUME: 95.3 fL (ref 80.0–100.0)
MEAN PLATELET VOLUME: 8.9 fL (ref 7.0–10.0)
MONOCYTES ABSOLUTE COUNT: 0.2 10*9/L (ref 0.2–0.8)
NEUTROPHILS ABSOLUTE COUNT: 2.6 10*9/L (ref 2.0–7.5)
PLATELET COUNT: 97 10*9/L — ABNORMAL LOW (ref 150–440)
RED BLOOD CELL COUNT: 3.46 10*12/L — ABNORMAL LOW (ref 4.50–5.90)
RED CELL DISTRIBUTION WIDTH: 16.9 % — ABNORMAL HIGH (ref 12.0–15.0)

## 2017-02-14 LAB — PHOSPHORUS: Phosphate:MCnc:Pt:Ser/Plas:Qn:: 3.5

## 2017-02-14 LAB — BILIRUBIN DIRECT: Bilirubin.glucuronidated:MCnc:Pt:Ser/Plas:Qn:: 0.4

## 2017-02-14 LAB — MAGNESIUM: Magnesium:MCnc:Pt:Ser/Plas:Qn:: 1.8

## 2017-02-14 LAB — GLUCOSE RANDOM: Glucose:MCnc:Pt:Ser/Plas:Qn:: 129

## 2017-02-14 LAB — CYCLOSPORINE, TROUGH: Lab: 225

## 2017-02-14 LAB — GAMMA GT: GAMMA GLUTAMYL TRANSFERASE: 539 U/L — ABNORMAL HIGH (ref 12–109)

## 2017-02-14 LAB — GAMMA GLUTAMYL TRANSFERASE: Gamma glutamyl transferase:CCnc:Pt:Ser/Plas:Qn:: 539 — ABNORMAL HIGH

## 2017-02-14 LAB — LYMPHOCYTES ABSOLUTE COUNT: Lab: 0.9 — ABNORMAL LOW

## 2017-02-14 NOTE — Unmapped (Signed)
[facility] ALLIED HEALTH PT [facility] [address] [facility]  OUTPATIENT PHYSICAL THERAPY  Treatment Note  02/14/2017    Patient Name: Jeffrey Ward  Date of Birth:Mar 12, 1951  Session Number: 3  Diagnosis:   Encounter Diagnoses   Name Primary?   ??? Weakness of both lower extremities Yes   ??? Impaired functional mobility, balance, gait, and endurance    ??? Other polyneuropathy    ??? Risk for falls        Onset of Symptoms: 10/31/16  Date of Evaluation: 01/31/17     Dates of Certification: 01/31/17 - 04/11/17       Problem List: Decreased strength, Decreased range of motion, Decreased endurance, Impaired balance, Decreased mobility, Impaired sensation, Pain, Fall Risk, Gait deviation    ASSESSMENT:    Assessment: Pt is a 66 year old male being seen for deconditioning, balance and gait difficulties, and increased risk of falls following a liver transplant (July 2018). Past medical history also significant for cancer, peripheral neuropathy, hepatits C, and heart failure. Pt appears to be exerting good effort during treatment sessions, and is demonstrating improvements in all areas. He is tolerating increasing numbers of repetitions of LE strengthening exercises, and was able to walk > 350' today using RW with supervision. Pt needs skilled PT services to address B LE weakness and deficits in balance, gait, and endurance in order to promote safe participation in home and community activities.        Short Term Goals:       Short Term Goal 1: Pt will demonstrate independence in performance of HEP x 1 in clinic to maximize benefits of PT.  Time Frame : 4 weeks   Short Term Goal 2: Pt will perform 5x STS with UE support in < 17 seconds, indicating improvement in functional mobility. GOAL MET (02/07/17)   Time Frame : 4 weeks   Short Term Goal 3: Pt will perform tandem stance > 10 seconds, indicating improved balance and reduced risk for falls GOAL MET (02/07/17)   Time Frame : 4 weeks                          Long Term Goals:  Long Term Goal 1: Pt will demonstrate strength gain of +1 per MMT for every LE muscle graded < 5/5, indicating improved strength for functional mobility   Time Frame: 10 weeks   Long Term Goal 2: Pt will be able to stand up from a standard chair 5 times consecutively without use of UE support, indicating improvement in functional lower extremity muscle strength. GOAL MET (02/07/17) Revised Goal: Pt will demonstrate performance of 5x STS in < 12 sec, indicating improved functional mobility and reduced risk for falls.   Time Frame: 10 weeks   Long Term Goal 3: Pt will perform TUG in < 12 seconds, indicating reduced risk for falls   Time Frame: 10 weeks                         PLAN:  2x week   for Duration: 8-10 weeks    Next Visit Plan: Step-up exercises, SLS activities, ambulation without an AD; 5xSTS test (timed)    SUBJECTIVE:  Subjective : Pt reports that he has been performing exercises regularly. He says that he got down on the floor to do the bridging exercise and had difficulty getting up from the floor, but was able to do so independently by crawling to the couch  and using UE support on that.  Pt denies having had any falls since last visit.   Precautions: Cancer history, Fall risk       OBJECTIVE:      Pain: Yes  Location: lumbar spine, neuropathic pain in B feet  Current Pain Level: 6 Max Pain Level: 10 Least Pain Level: 5               Pain Descriptors: Sharp      Additional Pain Comments: Pt reporting pain in toes of B feet, which he rates as 10/10 at worst and 6-7/10 currently.    Vital Signs:  Sitting: BP = 144/69 mmHg, HR = 68 bpm, O2 = 96%  On 02/07/17: Sitting after 15 minutes of activity = 137/116 mmHg, HR = 82 bpm (69 bpm at rest), 98% O2  Pt demonstrated SOB and fatigue during functional mobility, O2 Sat remained > 98% throughout session  ??  Special Cerebellar Tests:   F-N-F: NT  RAM (ankle DF): WNLs, symmetrical  H-S: NT  ??  Visual/Vestibular Testing: not indicated  ??  Cognitive Status/Communication: Patient is alert, oriented, and cooperative, and able to provide historical information. He has a weak, breathy voice, but his speech is easily intelligible.   ??  Posture/Observations:   Sitting: rounded shoulders and forward head, pt demonstrated sacral sitting  Standing: forward flexed posture, rounded shoulders and forward head  Skin assessment/Edema: dry skin, good color and warmth  ??  ??  Sensation:   5.07 Semmes-Weinstein Monofilament: diminished bilateral LE  Impaired  R foot = 7/10, R LE (ankle to knee) = 7/10  L foot = 7/10, L LE (ankle to knee) = 7/10  Pt reports diminished sensation L > R   ??  Proprioception:  Intact at the great toe bilaterally, with 9 out of 10 correct responses on the L and 10 out of 10 correct responses on the R.   ??  Reflexes:   Not Tested  ??  Range of Motion/Flexibility:   Within normal limits   ??  Strength/MMT:   UE muscle strength is generally in the 4/5 range   ??  LE MMT   ??  LE MMT  Left  Right    Hip flex: (L2)  3+/5  4/5    Hip abd:  3-/5  3/5    Hip ext: -/5 -/5   Knee ext: (L3)  3+/5  4/5    Knee flex: (S2)  4/5  4/5    Ankle DF: (L4)  3+/5  3+/5    Ankle PF: (S1)  2+/5  2+/5    ??  Functional Mobility:  Pt is independent in all bed mobility and transfers, but performs them slowly. Pt is unable to come to standing from standard chair independently without use of UEs for support.  Rolling: independent  Supine to sit: independent  Sit to supine: independent  Sit to stand: modified independent with use of bilateral UE support  ??  Balance:   Patient able to maintain balance in sitting and in typical stance position independently without UE support. Able to maintain feet together and semi-tandem stance positions for >10 sec without UE support.  ??  Gait Analysis:  decreased gait speed  decreased R step length  decreased R toe clearance   Pt demonstrated decreased bilateral push-off, heel strike R > L, and step length  Patient ambulates with rollator   ??  Functional Test/Outcome Measures: 5XSTS: unable, on 02/07/17: 15.6 sec without UE  support  Pt completed modified testing with 5 stands in 20.9 sec using B UE support  ??  TUG: 18.6 seconds with rollator  ??  Tandem Stance: 1-2 sec with L foot forward; 4.9 sec with R foot forward, on 02/07/17: L foot forward = 14.2 sec, R foot forward = 27 sec  ??  SLS: unable, on 02/07/17: L foot = 1-2 sec, R foot = 4.6 sec  ??  Gait speed on 02/07/17: 0.71 m/s, pt performed with rollator on flat, even surface across 20' with supervision; on 02/14/17 = 0.73 m/sec average over ~300'    Education Provided: HEP  Education results: Verbalized understanding, Needs further instruction       Total Time: 55    Today's Charges:  Treatment Rendered: 55 minutes  Therapeutic Activity: 45 minutes (3 units)  - STS 2x5 with CGA and without UE support; pt performed well  - Unilateral ankle DF in step stance position without UE support, 2x10 bilaterally, with CGA; pt needed cueing to perform movement at ankle joint only (without motion at hip/knee)   - Bridging exercise in supine, 2x10 with 3-sec holds; pt needed cueing to maintain LEs in good alignment (without hip ABD/ ER)    - Supine SLR 3x10 for R LE only; pt performed well  - Supine quad set 3x10 with 3-sec holds for L LE only (pt unable to perform SLR on L without significant increase in LBP); challenging exercise for pt  - Side-lying hip abduction 3x10 bilaterally; pt needed minimal verbal and tactile cueing for correct form  - Forward step-ups (4 step) without UE support, 2x5 with each LE leading, with CG to occasional min A; pt had much more difficulty performing this activity with L LE leading (as compared to R LE), but demonstrated good improvement with practice/repetition  - Walking >350' outdoors on paved level surfaces using rollator with S to CGA; comfortable gait speed for first half of walk = 0.75 m/sec and for second half of walk = 0.70 m/sec    Neuromuscular Re-education: 10 minutes (1 unit)  - Toe taps onto 4 step 1x10 bilaterally without UE support with CGA; challenging activity for pt, but generally performed well  - Stepping forward and backward over progressively larger obstacles (pole, foam noodle, foam half roll) 3x consecutively without contacting the obstacle, without UE support with CG to occasional min A; pt had 2 instances of LOB when stepping backward that required min A from therapist for balance recovery  - Maintaining balance in SLS while placing opposite foot on bolster at various locations (center, L, R) and rolling bolster forward/backward 3x, multiple reps with each LE with CGA; challenging activity for pt                     I attest that I have reviewed the above information.  Signed: Sedalia Muta, PT  02/14/2017 2:35 PM

## 2017-02-14 NOTE — Unmapped (Signed)
Dose Change For: NEORAL 100MG  CAPS    Previous Rx:  Patient Last Received: Neoral 100mg  caps  Amount Received: 120 caps  Dose: 2 caps po bid   New Rx:              Date on Script: Neoral 100mg  caps & Neoral 25mg  caps              New Dose: 175mg  bid    Insurance Code: [place]

## 2017-02-14 NOTE — Unmapped (Signed)
[facility] Refill and Clinical Coordination Note  Medication(s): neoral 25mg , neoral 100mg , mycophenolate 250mg  - only getting new rx for neoral 25mg  at this time, but clinical done on all 3 today to keep on same schedule.    Jeffrey Ward, DOB: 09-Oct-1950  Phone: [phone] (home) , Alternate phone contact: N/A  Shipping address: [address]  [place] [address] [address] [place], [place] [address]  Phone or address changes today?: No  All above HIPAA information verified.  Insurance changes? No    Completed refill and clinical call assessment today to schedule patient's medication shipment from the [facility] [phone]).      MEDICATION RECONCILIATION    Confirmed the medication and dosage are correct and have not changed: change in neoral - now on 175bid. others the same.    Were there any changes to your medication(s) in the past month:  no other changes at this time    ADHERENCE    Is this medicine transplant or covered by Medicare Part B? Yes.    Neoral 25 mg   Quantity filled last month: 0 (new rx today, however wife states had some left over)   # of tablets left on hand: 24      Did you miss any doses in the past 4 weeks? No missed doses reported.  Adherence counseling provided? Not needed     SIDE EFFECT MANAGEMENT    Are you tolerating your medication?:  Jeffrey Ward reports tolerating the medication.  Side effect management discussed: None      Therapy is appropriate and should be continued.    Evidence of clinical benefit: See Epic note from 02/06/17      FINANCIAL/SHIPPING    Delivery Scheduled: Yes, Expected medication delivery date: 02/16/17   Additional medications refilled: No additional medications/refills needed at this time.    Jeffrey Ward did not have any additional questions at this time.    Delivery address validated in [facility] scheduling system: Yes, address listed above is correct.      We will follow up with patient monthly for standard refill processing and delivery.      Thank you,  Thad Ranger   [facility] Specialty Pharmacist

## 2017-02-15 MED FILL — NEORAL/25MG/CAP: NEORAL/25MG/CAP | 30 days supply | Qty: 180 | Fill #0

## 2017-02-15 NOTE — Unmapped (Signed)
Confirmed with Dr. Matilde Haymaker patient can precede with vocal cord injection that is scheduled with Dr. Sherryll Burger on 02/24/17 since LFTs are now stabilizing and trending back down after ERCP.

## 2017-02-17 ENCOUNTER — Ambulatory Visit
Admission: RE | Admit: 2017-02-17 | Discharge: 2017-02-17 | Disposition: A | Discharge status: Home / Self Care | Payer: MEDICARE

## 2017-02-17 DIAGNOSIS — G6289 Other specified polyneuropathies: Secondary | ICD-10-CM

## 2017-02-17 DIAGNOSIS — R29898 Other symptoms and signs involving the musculoskeletal system: Principal | ICD-10-CM

## 2017-02-17 DIAGNOSIS — Z79899 Other long term (current) drug therapy: Secondary | ICD-10-CM

## 2017-02-17 DIAGNOSIS — Z7409 Other reduced mobility: Secondary | ICD-10-CM

## 2017-02-17 DIAGNOSIS — Z9181 History of falling: Secondary | ICD-10-CM

## 2017-02-17 DIAGNOSIS — Z944 Liver transplant status: Principal | ICD-10-CM

## 2017-02-17 DIAGNOSIS — K769 Liver disease, unspecified: Secondary | ICD-10-CM

## 2017-02-17 LAB — MAGNESIUM: Magnesium:MCnc:Pt:Ser/Plas:Qn:: 1.7

## 2017-02-17 LAB — CBC W/ AUTO DIFF
BASOPHILS ABSOLUTE COUNT: 0 10*9/L (ref 0.0–0.1)
EOSINOPHILS ABSOLUTE COUNT: 0.1 10*9/L (ref 0.0–0.4)
HEMATOCRIT: 32.9 % — ABNORMAL LOW (ref 41.0–53.0)
HEMOGLOBIN: 11 g/dL — ABNORMAL LOW (ref 13.5–17.5)
LYMPHOCYTES ABSOLUTE COUNT: 1.1 10*9/L — ABNORMAL LOW (ref 1.5–5.0)
MEAN CORPUSCULAR HEMOGLOBIN CONC: 33.5 g/dL (ref 31.0–37.0)
MEAN CORPUSCULAR HEMOGLOBIN: 31.7 pg (ref 26.0–34.0)
MEAN PLATELET VOLUME: 9.5 fL (ref 7.0–10.0)
MONOCYTES ABSOLUTE COUNT: 0.3 10*9/L (ref 0.2–0.8)
NEUTROPHILS ABSOLUTE COUNT: 2.4 10*9/L (ref 2.0–7.5)
PLATELET COUNT: 95 10*9/L — ABNORMAL LOW (ref 150–440)
RED BLOOD CELL COUNT: 3.48 10*12/L — ABNORMAL LOW (ref 4.50–5.90)
RED CELL DISTRIBUTION WIDTH: 16.9 % — ABNORMAL HIGH (ref 12.0–15.0)
WBC ADJUSTED: 3.9 10*9/L — ABNORMAL LOW (ref 4.5–11.0)

## 2017-02-17 LAB — CYCLOSPORINE, TROUGH: Lab: 118

## 2017-02-17 LAB — COMPREHENSIVE METABOLIC PANEL
ALBUMIN: 3.7 g/dL (ref 3.5–5.0)
ALKALINE PHOSPHATASE: 315 U/L — ABNORMAL HIGH (ref 38–126)
ALT (SGPT): 25 U/L (ref 19–72)
ANION GAP: 7 mmol/L — ABNORMAL LOW (ref 9–15)
AST (SGOT): 16 U/L — ABNORMAL LOW (ref 19–55)
BILIRUBIN TOTAL: 0.8 mg/dL (ref 0.0–1.2)
BLOOD UREA NITROGEN: 34 mg/dL — ABNORMAL HIGH (ref 7–21)
BUN / CREAT RATIO: 22
CALCIUM: 10.1 mg/dL (ref 8.5–10.2)
CHLORIDE: 109 mmol/L — ABNORMAL HIGH (ref 98–107)
CO2: 24 mmol/L (ref 22.0–30.0)
EGFR MDRD AF AMER: 53 mL/min/{1.73_m2} — ABNORMAL LOW (ref >=60)
EGFR MDRD NON AF AMER: 44 mL/min/{1.73_m2} — ABNORMAL LOW (ref >=60)
GLUCOSE RANDOM: 145 mg/dL (ref 65–179)
POTASSIUM: 5 mmol/L (ref 3.5–5.0)
PROTEIN TOTAL: 6.4 g/dL — ABNORMAL LOW (ref 6.5–8.3)
SODIUM: 140 mmol/L (ref 135–145)

## 2017-02-17 LAB — PHOSPHORUS: Phosphate:MCnc:Pt:Ser/Plas:Qn:: 3.5

## 2017-02-17 LAB — GAMMA GLUTAMYL TRANSFERASE: Gamma glutamyl transferase:CCnc:Pt:Ser/Plas:Qn:: 404 — ABNORMAL HIGH

## 2017-02-17 LAB — BILIRUBIN DIRECT: Bilirubin.glucuronidated:MCnc:Pt:Ser/Plas:Qn:: 0.4

## 2017-02-17 LAB — BLOOD UREA NITROGEN: Urea nitrogen:MCnc:Pt:Ser/Plas:Qn:: 34 — ABNORMAL HIGH

## 2017-02-17 LAB — MEAN CORPUSCULAR HEMOGLOBIN: Lab: 31.7

## 2017-02-17 NOTE — Unmapped (Signed)
[facility] ALLIED HEALTH PT [address]  OUTPATIENT PHYSICAL THERAPY  Treatment Note  02/17/2017    Patient Name: Jeffrey Ward  Date of Birth:11-26-1950  Session Number: 4  Diagnosis:   Encounter Diagnoses   Name Primary?   ??? Weakness of both lower extremities Yes   ??? Impaired functional mobility, balance, gait, and endurance    ??? Other polyneuropathy    ??? Risk for falls        Onset of Symptoms: 10/31/16  Date of Evaluation: 01/31/17     Dates of Certification: 01/31/17 - 04/11/17       Problem List: Decreased strength, Decreased range of motion, Decreased endurance, Impaired balance, Decreased mobility, Impaired sensation, Pain, Fall Risk, Gait deviation    ASSESSMENT:    Assessment: Pt is a 66 year old male being seen for deconditioning, balance and gait difficulties, and increased risk of falls following a liver transplant (July 2018). Past medical history also significant for cancer, peripheral neuropathy, hepatitis C, and heart failure. Pt continues to demonstrate steady improvement in all areas of PT.  His exercise tolerance is increasing, and he is doing better with walking without an AD.  Pt needs skilled PT services to address B LE weakness and deficits in balance, gait, and endurance in order to promote safe participation in home and community activities.       Short Term Goals:       Short Term Goal 1: Pt will demonstrate independence in performance of HEP x 1 in clinic to maximize benefits of PT. GOAL MET (02/17/17)  Time Frame : 4 weeks   Short Term Goal 2: Pt will perform 5x STS with UE support in < 17 seconds, indicating improvement in functional mobility. GOAL MET (02/07/17)   Time Frame : 4 weeks   Short Term Goal 3: Pt will perform tandem stance > 10 seconds, indicating improved balance and reduced risk for falls GOAL MET (02/07/17)   Time Frame : 4 weeks                          Long Term Goals:  Long Term Goal 1: Pt will demonstrate strength gain of +1 per MMT for every LE muscle graded < 5/5, indicating improved strength for functional mobility   Time Frame: 10 weeks   Long Term Goal 2: Pt will be able to stand up from a standard chair 5 times consecutively without use of UE support, indicating improvement in functional lower extremity muscle strength. GOAL MET (02/07/17) Revised Goal: Pt will demonstrate performance of 5x STS in < 12 sec, indicating improved functional mobility and reduced risk for falls.   Time Frame: 10 weeks   Long Term Goal 3: Pt will perform TUG in < 12 seconds, indicating reduced risk for falls   Time Frame: 10 weeks                         PLAN:  2x week   for Duration: 8-10 weeks    Next Visit Plan: Step-up exercises, SLS activities, ambulation with a cane (for longer community distances) or without an AD (for household distances)    SUBJECTIVE:  Subjective : Pt reports that he has been performing his exercises regularly without difficulty. He says that he has been able to walk safely to/from the bathroom without use of walker. Pt denies having had any falls since last visit.   Precautions: Cancer history, Fall risk  OBJECTIVE:      Pain: Yes  Location: low back; B feet (neuropathic)  Current Pain Level: 6      Pain Frequency: Constant/continuous                   Additional Pain Comments: LBP remains at 6 out of 10. Pain in B feet rated at 3-4 out of 10 today (improved compared to 6-7 rating at last visit).    Vital Signs:  Sitting: BP = 144/69 mmHg, HR = 68 bpm, O2 = 96%  On 02/07/17: Sitting after 15 minutes of activity = 137/116 mmHg, HR = 82 bpm (69 bpm at rest), 98% O2  Pt demonstrated SOB and fatigue during functional mobility, O2 Sat remained > 98% throughout session  ??  Special Cerebellar Tests:   F-N-F: NT  RAM (ankle DF): WNLs, symmetrical  H-S: NT  ??  Visual/Vestibular Testing: not indicated  ??  Cognitive Status/Communication: Patient is alert, oriented, and cooperative, and able to provide historical information. He has a weak, breathy voice, but his speech is easily intelligible.   ??  Posture/Observations:   Sitting: rounded shoulders and forward head, pt demonstrated sacral sitting  Standing: forward flexed posture, rounded shoulders and forward head  Skin assessment/Edema: dry skin, good color and warmth  ??  ??  Sensation:   5.07 Semmes-Weinstein Monofilament: diminished bilateral LE  Impaired  R foot = 7/10, R LE (ankle to knee) = 7/10  L foot = 7/10, L LE (ankle to knee) = 7/10  Pt reports diminished sensation L > R   ??  Proprioception:  Intact at the great toe bilaterally, with 9 out of 10 correct responses on the L and 10 out of 10 correct responses on the R.   ??  Reflexes:   Not Tested  ??  Range of Motion/Flexibility:   Within normal limits   ??  Strength/MMT:   UE muscle strength is generally in the 4/5 range   ??  LE MMT   ??  LE MMT  Left  Right    Hip flex: (L2)  3+/5  4/5    Hip abd:  3-/5  3/5    Hip ext: -/5 -/5   Knee ext: (L3)  3+/5  4/5    Knee flex: (S2)  4/5  4/5    Ankle DF: (L4)  3+/5  3+/5    Ankle PF: (S1)  2+/5  2+/5    ??  Functional Mobility:  Pt is independent in all bed mobility and transfers, but performs them slowly. Pt is unable to come to standing from standard chair independently without use of UEs for support.  Rolling: independent  Supine to sit: independent  Sit to supine: independent  Sit to stand: modified independent with use of bilateral UE support  ??  Balance:   Patient able to maintain balance in sitting and in typical stance position independently without UE support. Able to maintain feet together and semi-tandem stance positions for >10 sec without UE support.  ??  Gait Analysis:  decreased gait speed  decreased R step length  decreased R toe clearance   Pt demonstrated decreased bilateral push-off, heel strike R > L, and step length  Patient ambulates with rollator   ??  Functional Test/Outcome Measures:  5XSTS: unable; on 02/07/17, 15.6 sec without UE support; on 02/17/17, best time was 17.2 sec  Pt completed modified testing with 5 stands in 20.9 sec using B UE support  ??  TUG: 18.6 sec with rollator; on 02/17/17 = 12.7 sec with rollator, 13.6 sec without an AD  ??  Tandem Stance: 1-2 sec with L foot forward; 4.9 sec with R foot forward, on 02/07/17: L foot forward = 14.2 sec, R foot forward = 27 sec  ??  SLS: unable, on 02/07/17: L foot = 1-2 sec, R foot = 4.6 sec  ??  Gait speed on 02/07/17: 0.71 m/s, pt performed with rollator on flat, even surface across 20' with supervision; on 02/14/17 = 0.73 m/sec average over ~300'    Education Provided: HEP  Education results: Verbalized understanding, demonstrated understanding       Total Time: 55    Today's Charges:  Treatment Rendered: 55 minutes  Therapeutic Exercise: 27 minutes (2 units)  - STS 3x5 with CGA and without UE support; pt reporting pain in back and hips, with slightly slower times than on 02/07/17  - Unilateral ankle DF in step stance position without UE support, 1x15 bilaterally, with CGA; pt needed cueing to perform movement at ankle joint only (without motion at hip/knee)   - Bridging exercise in supine, 2x10 with 3-sec holds; pt performed well without cueing     - Supine SLR 3x10 for R LE only; pt performed well  - Supine quad set 3x10 with 3-sec holds for L LE only (pt unable to perform SLR on L without significant increase in LBP); still a challenging exercise for pt  - Side-lying hip abduction 2x10 bilaterally with 3-sec holds; pt performed well without cueing - added to HEP  - Forward step-ups (4 step) without UE support, 2x5 with each LE leading, with CG to occasional min A; improved compared to previous     Neuromuscular Re-education: 28 minutes (2 units)  - Toe taps onto 4 step 1x10 bilaterally and onto 6 step 1x10 bilaterally, both without UE support, with CGA; challenging activity for pt, but generally performed well  - Slow marching in place 1x10 bilaterally with CGA; still some tendency for pt to have to lower foot to floor quickly   - Maintaining balance in SLS while tapping targets (collapsible cones) arranged in a semicircle on floor with other foot, multiple reps with CGA; challenging activity for pt, particularly when tapping with R foot, with pt not able to tap more than one cone before having to touch foot down to floor   - Maintaining balance while side stepping x 1 each direction and walking forward x 4 on foam balance beam with CGA; pt able to perform side stepping without difficulty, but was appropriately challenged by task of walking forward on beam  - Maintaining balance while negotiating obstacle course requiring pt to step on/off a series of three steps (3, 8, and 6 high), step over low obstacles, and walk up/down short slope x2 with CGA; pt performed well   - Maintaining balance while standing on firm surface with feet together and throwing/catching small weighted ball using rebounder with CGA; challenging activity for pt, but generally performed well  - Maintaining balance while standing up from a chair, walking 3 meters, turning around, walking back to chair, and sitting down (TUG) x 3, with CGA; pt performed well (see times above)                    I attest that I have reviewed the above information.  Signed: Sedalia Muta, PT  02/17/2017 1:26 PM

## 2017-02-17 NOTE — Unmapped (Signed)
Per PA Shurney will continue to monitor labs since creatinine is up but recent decrease in CSA was done.  Wife agreed to get repeat labs for patient tomorrow.

## 2017-02-20 MED ORDER — NEORAL 100 MG CAPSULE: capsule | 11 refills | 0 days | Status: AC

## 2017-02-20 MED ORDER — NEORAL 100 MG CAPSULE
ORAL_CAPSULE | ORAL | 11 refills | 0.00000 days | Status: CP
Start: 2017-02-20 — End: 2017-03-24

## 2017-02-20 MED ORDER — NEORAL 25 MG CAPSULE: each | 11 refills | 0 days

## 2017-02-20 MED ORDER — NEORAL 25 MG CAPSULE
ORAL | 11 refills | 0.00000 days | Status: CP
Start: 2017-02-20 — End: 2017-02-20

## 2017-02-20 NOTE — Unmapped (Addendum)
Per Dr. Rush Barer patient to increase Neoral to 200 mg in am and 175 mg in pm.  Wife verbalized understanding.  Stated she will get labs on Wednesday because they are snowed in.  Let her know this is fine and to only do what is safe for her and patient.      Also received EPIC message from Dr. 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

## 2017-02-23 NOTE — Unmapped (Signed)
Jeffrey Ward, spouse of patient returned this nurses' call concerning patient appointment Mar 01, 2017. Patient's spouse states patient will wait until May 31, 2017 and cancel  Mar 01, 2018 appointment. Spoke with patient, instructed her, on 1:00 pm time for rescheduled appointment on the [facility] at the ENT clinic. Patient's spouse verbalizes understanding of all instructions provided, date, time, location of appointment.

## 2017-02-23 NOTE — Unmapped (Signed)
Called patient's number listed on chart x 3. Call went directly to voicemail. Left message stating laryngologist would like to know if he would like to wait on injection currently scheduled for 03/01/17,  until March 20. Instructed on voicemail message, patient will have another provider, other than Dr. Sherryll Burger if he would like to keep the 03/01/17 appointment due to physician being out on leave. Requested patient return call as soon as possible. Awaiting patient's call back.

## 2017-02-23 NOTE — Unmapped (Signed)
Spoke with Dr. Celine Mans about MRI/MRCP that reviewed his [facility] and stated it looked fine.  Stated to continue to do [facility] protocol MRIs.  Next MRI is due February 2019.  Messaged TPA to schedule.      Wife confirmed they are no longer snowed in and will get labs tomorrow.

## 2017-02-24 ENCOUNTER — Ambulatory Visit
Admission: RE | Admit: 2017-02-24 | Discharge: 2017-02-24 | Disposition: A | Discharge status: Home / Self Care | Payer: MEDICARE

## 2017-02-24 DIAGNOSIS — Z7409 Other reduced mobility: Secondary | ICD-10-CM

## 2017-02-24 DIAGNOSIS — K769 Liver disease, unspecified: Secondary | ICD-10-CM

## 2017-02-24 DIAGNOSIS — Z79899 Other long term (current) drug therapy: Secondary | ICD-10-CM

## 2017-02-24 DIAGNOSIS — G6289 Other specified polyneuropathies: Secondary | ICD-10-CM

## 2017-02-24 DIAGNOSIS — Z9181 History of falling: Secondary | ICD-10-CM

## 2017-02-24 DIAGNOSIS — R29898 Other symptoms and signs involving the musculoskeletal system: Principal | ICD-10-CM

## 2017-02-24 DIAGNOSIS — Z944 Liver transplant status: Principal | ICD-10-CM

## 2017-02-24 LAB — COMPREHENSIVE METABOLIC PANEL
ALBUMIN: 3.5 g/dL (ref 3.5–5.0)
ALKALINE PHOSPHATASE: 275 U/L — ABNORMAL HIGH (ref 38–126)
ALT (SGPT): 28 U/L (ref 19–72)
ANION GAP: 5 mmol/L — ABNORMAL LOW (ref 9–15)
AST (SGOT): 18 U/L — ABNORMAL LOW (ref 19–55)
BLOOD UREA NITROGEN: 30 mg/dL — ABNORMAL HIGH (ref 7–21)
BUN / CREAT RATIO: 21
CALCIUM: 9.9 mg/dL (ref 8.5–10.2)
CHLORIDE: 109 mmol/L — ABNORMAL HIGH (ref 98–107)
CO2: 26 mmol/L (ref 22.0–30.0)
CREATININE: 1.43 mg/dL — ABNORMAL HIGH (ref 0.70–1.30)
EGFR MDRD AF AMER: >=60 mL/min/{1.73_m2} (ref >=60)
EGFR MDRD NON AF AMER: 49 mL/min/{1.73_m2} — ABNORMAL LOW (ref >=60)
GLUCOSE RANDOM: 133 mg/dL (ref 65–179)
POTASSIUM: 5.2 mmol/L — ABNORMAL HIGH (ref 3.5–5.0)
PROTEIN TOTAL: 6.1 g/dL — ABNORMAL LOW (ref 6.5–8.3)
SODIUM: 140 mmol/L (ref 135–145)

## 2017-02-24 LAB — CYCLOSPORINE, TROUGH: Lab: 125

## 2017-02-24 LAB — CBC W/ AUTO DIFF
EOSINOPHILS ABSOLUTE COUNT: 0.1 10*9/L (ref 0.0–0.4)
HEMATOCRIT: 31.6 % — ABNORMAL LOW (ref 41.0–53.0)
HEMOGLOBIN: 10.5 g/dL — ABNORMAL LOW (ref 13.5–17.5)
LARGE UNSTAINED CELLS: 3 % (ref 0–4)
LYMPHOCYTES ABSOLUTE COUNT: 0.9 10*9/L — ABNORMAL LOW (ref 1.5–5.0)
MEAN CORPUSCULAR HEMOGLOBIN CONC: 33.3 g/dL (ref 31.0–37.0)
MEAN CORPUSCULAR VOLUME: 95.2 fL (ref 80.0–100.0)
MEAN PLATELET VOLUME: 9.2 fL (ref 7.0–10.0)
MONOCYTES ABSOLUTE COUNT: 0.2 10*9/L (ref 0.2–0.8)
NEUTROPHILS ABSOLUTE COUNT: 2 10*9/L (ref 2.0–7.5)
PLATELET COUNT: 101 10*9/L — ABNORMAL LOW (ref 150–440)
RED BLOOD CELL COUNT: 3.32 10*12/L — ABNORMAL LOW (ref 4.50–5.90)
RED CELL DISTRIBUTION WIDTH: 16.6 % — ABNORMAL HIGH (ref 12.0–15.0)
WBC ADJUSTED: 3.3 10*9/L — ABNORMAL LOW (ref 4.5–11.0)

## 2017-02-24 LAB — PHOSPHORUS: Phosphate:MCnc:Pt:Ser/Plas:Qn:: 3.1

## 2017-02-24 LAB — GAMMA GLUTAMYL TRANSFERASE: Gamma glutamyl transferase:CCnc:Pt:Ser/Plas:Qn:: 296 — ABNORMAL HIGH

## 2017-02-24 LAB — BILIRUBIN, DIRECT: BILIRUBIN DIRECT: <0.1 mg/dL (ref 0.00–0.40)

## 2017-02-24 LAB — NEUTROPHILS ABSOLUTE COUNT: Lab: 2

## 2017-02-24 LAB — MAGNESIUM: Magnesium:MCnc:Pt:Ser/Plas:Qn:: 1.8

## 2017-02-24 LAB — BILIRUBIN DIRECT: Bilirubin.glucuronidated:MCnc:Pt:Ser/Plas:Qn:: <0.1

## 2017-02-24 LAB — ALBUMIN: Albumin:MCnc:Pt:Ser/Plas:Qn:: 3.5

## 2017-02-24 NOTE — Unmapped (Signed)
Called and left VM for patient letting him know LFTs are improved.

## 2017-02-25 NOTE — Unmapped (Signed)
[facility] ALLIED HEALTH PT [facility]  OUTPATIENT PHYSICAL THERAPY  Treatment Note  02/24/2017    Patient Name: Jeffrey Ward  Date of Birth:1950-05-08  Session Number: 5  Diagnosis:   Encounter Diagnoses   Name Primary?   ??? Weakness of both lower extremities Yes   ??? Impaired functional mobility, balance, gait, and endurance    ??? Other polyneuropathy    ??? Risk for falls        Onset of Symptoms: 10/31/16  Date of Evaluation: 01/31/17     Dates of Certification: 01/31/17 - 04/11/17       Problem List: Decreased strength, Decreased range of motion, Decreased endurance, Impaired balance, Decreased mobility, Impaired sensation, Pain, Fall Risk, Gait deviation    ASSESSMENT:    Assessment: Pt is a 66 year old male being seen for deconditioning, balance/gait difficulties, and increased risk of falls following a liver transplant (July 2018). Past medical history also significant for cancer, peripheral neuropathy, hepatitis C, and heart failure. Pt continues to show good progress in all areas of PT.  His gait stability has improved to the point where he should be able to use a SPC rather than a RW for community mobility. Pt needs skilled PT services to address B LE weakness and deficits in balance, gait, and endurance in order to promote safe participation in home and community activities.         Short Term Goals:       Short Term Goal 1: Pt will demonstrate independence in performance of HEP x 1 in clinic to maximize benefits of PT. GOAL MET (02/17/17)  Time Frame : 4 weeks   Short Term Goal 2: Pt will perform 5x STS with UE support in < 17 seconds, indicating improvement in functional mobility. GOAL MET (02/07/17)   Time Frame : 4 weeks   Short Term Goal 3: Pt will perform tandem stance > 10 seconds, indicating improved balance and reduced risk for falls GOAL MET (02/07/17)   Time Frame : 4 weeks                          Long Term Goals:  Long Term Goal 1: Pt will demonstrate strength gain of +1 per MMT for every LE muscle graded < 5/5, indicating improved strength for functional mobility   Time Frame: 10 weeks   Long Term Goal 2: Pt will be able to stand up from a standard chair 5 times consecutively without use of UE support, indicating improvement in functional lower extremity muscle strength. GOAL MET (02/07/17) Revised Goal: Pt will demonstrate performance of 5x STS in < 12 sec, indicating improved functional mobility and reduced risk for falls.   Time Frame: 10 weeks   Long Term Goal 3: Pt will perform TUG in < 12 seconds, indicating reduced risk for falls   Time Frame: 10 weeks   Long Term Goal 4: (NEW GOAL added 02/24/17) Pt will be able to maintain SLS for at least 6.5 sec on each LE in order to demonstrate decrease in risk for falls.   Time Frame: 10 weeks               PLAN:  2x week   for Duration: 8-10 weeks    Next Visit Plan: SLS activities, ambulation outdoors with SPC    SUBJECTIVE:  Subjective : Pt reports that he has been performing his exercises regularly. He helped his wife shovel some snow at the end of their driveway  so that they could get the car out this morning. Pt denies having had any falls since last visit.  Precautions: Cancer history, Fall risk  Other - Specify:: G-tube    OBJECTIVE:      Pain: Yes  Location: low back; B feet  Current Pain Level: 5 Max Pain Level: 10 Least Pain Level: 5  Pain Frequency: Constant/continuous               Pain related behaviors: None   Additional Pain Comments: LBP at 5 out of 10. Pain in B feet rated at 4 out of 10 currently, 8-9 out of 10 at worst, and 4 out of 10 at best.    Vital Signs:  Sitting: BP = 144/69 mmHg, HR = 68 bpm, O2 = 96%  On 02/07/17: Sitting after 15 minutes of activity = 137/116 mmHg, HR = 82 bpm (69 bpm at rest), 98% O2  Pt demonstrated SOB and fatigue during functional mobility, O2 Sat remained > 98% throughout session  ??  Special Cerebellar Tests:   F-N-F: NT  RAM (ankle DF): WNLs, symmetrical  H-S: NT  ??  Visual/Vestibular Testing: not indicated Cognitive Status/Communication: Patient is alert, oriented, and cooperative, and able to provide historical information. He has a weak, breathy voice, but his speech is easily intelligible.   ??  Posture/Observations:   Sitting: rounded shoulders and forward head, pt demonstrated sacral sitting  Standing: forward flexed posture, rounded shoulders and forward head  Skin assessment/Edema: dry skin, good color and warmth  ??  ??  Sensation:   5.07 Semmes-Weinstein Monofilament: diminished bilateral LE  Impaired  R foot = 7/10, R LE (ankle to knee) = 7/10  L foot = 7/10, L LE (ankle to knee) = 7/10  Pt reports diminished sensation L > R   ??  Proprioception:  Intact at the great toe bilaterally, with 9 out of 10 correct responses on the L and 10 out of 10 correct responses on the R.   ??  Reflexes:   Not Tested  ??  Range of Motion/Flexibility:   Within normal limits   ??  Strength/MMT:   UE muscle strength is generally in the 4/5 range   ??  LE MMT   ??  LE MMT  Left  Right    Hip flex: (L2)  3+/5  4/5    Hip abd:  3-/5  3/5    Hip ext: -/5 -/5   Knee ext: (L3)  3+/5  4/5    Knee flex: (S2)  4/5  4/5    Ankle DF: (L4)  3+/5  3+/5    Ankle PF: (S1)  2+/5  2+/5    ??  Functional Mobility:  Pt is independent in all bed mobility and transfers, but performs them slowly. Pt is unable to come to standing from standard chair independently without use of UEs for support.  Rolling: independent  Supine to sit: independent  Sit to supine: independent  Sit to stand: modified independent with use of bilateral UE support  ??  Balance:   Patient able to maintain balance in sitting and in typical stance position independently without UE support. Able to maintain feet together and semi-tandem stance positions for >10 sec without UE support.  ??  Gait Analysis:  decreased gait speed  decreased R step length  decreased R toe clearance   Pt demonstrated decreased bilateral push-off, heel strike R > L, and step length  Patient ambulates with rollator ??  Functional  Test/Outcome Measures:  5XSTS: unable; pt completed modified testing with 5 stands in 20.9 sec using B UE support; on 02/07/17, 15.6 sec without UE support; on 02/17/17, best time was 17.2 sec without UE support; on 02/24/17, best time was 15.0 sec without UE support    ??  TUG: 18.6 sec with rollator; on 02/17/17 = 12.7 sec with rollator, 13.6 sec without an AD  ??  Tandem Stance: 1-2 sec with L foot forward; 4.9 sec with R foot forward, on 02/07/17: L foot forward = 14.2 sec, R foot forward = 27 sec  ??  SLS: unable, on 02/07/17: L foot = 1-2 sec, R foot = 4.6 sec  ??  Gait speed on 02/07/17: 0.71 m/s, pt performed with rollator on flat, even surface across 20' with supervision; on 02/14/17 = 0.73 m/sec average over ~300'    Education Provided: DME instruction, HEP  Education results: Verbalized understanding, demonstrated understanding       Total Time: 58    Today's Charges:  Treatment Rendered: 58 minutes  Therapeutic Exercise: 28 minutes (2 units)  - STS 3x5 with CGA and without UE support; performance slightly improved compared to previous  - Unilateral ankle DF in step stance position without UE support, 1x20 bilaterally, with CGA; pt had more difficulty performing this exercise with L LE as compared to R LE   - Bridging exercise in supine, 2x12 with 3-sec holds; pt performed well without cueing     - Supine SLR 2x15 with R LE and 1x15 with L LE; pt successful in progressing to performance of this exercise with L LE as well as R LE  - Supine quad set 1x15 with 3-sec holds for L LE; improved compared to previous  - Side-lying hip abduction 2x15 bilaterally with 3-sec holds; pt performed well without cueing   - Forward step-ups (6 step) without UE support, 2x5 with each LE leading, with CG; improved compared to previous     Neuromuscular Re-education:18 minutes (1 unit)  - Toe taps onto 6 step without UE support, 1x15 bilaterally with CGA; generally performed well  - Attempting to maintain balance in SLS without UE support with CGA, multiple reps; very challenging activity for pt, with pt typically able to maintain position for only 2-3 sec on each LE  - Maintaining balance while side stepping x 2 each direction and walking forward x 4 on foam balance beam with CGA; pt able to perform side stepping without difficulty, but was appropriately challenged by task of walking forward on beam  - Maintaining balance while standing on firm surface with feet together and throwing/catching small weighted ball using rebounder with CGA; challenging activity for pt, but generally performed well    Gait Training: 12 minutes (1 unit)  Pt practiced walking ~150' x 2 and up/down single step (simulated curb) with CGA using SPC in R hand. He initially had difficulty maintaining 2-pt gait pattern and symmetrical step lengths when using SPC, but improved quickly with cueing and repetition/ practice.                     I attest that I have reviewed the above information.  Signed: Sedalia Muta, PT  02/24/2017 5:00 PM

## 2017-02-27 NOTE — Unmapped (Signed)
Per PharmD Nedra Hai no intervention for low CSA level since it has only been 4 days since the adjustment.

## 2017-02-28 ENCOUNTER — Observation Stay
Admission: EM | Admit: 2017-02-28 | Discharge: 2017-03-01 | Disposition: A | Discharge status: Home / Self Care | Payer: MEDICARE | Source: Intra-hospital

## 2017-02-28 ENCOUNTER — Observation Stay
Admission: EM | Admit: 2017-02-28 | Discharge: 2017-03-01 | Disposition: A | Discharge status: Home / Self Care | Source: Intra-hospital

## 2017-02-28 DIAGNOSIS — J101 Influenza due to other identified influenza virus with other respiratory manifestations: Principal | ICD-10-CM

## 2017-02-28 LAB — CBC W/ AUTO DIFF
BASOPHILS ABSOLUTE COUNT: 0 10*9/L (ref 0.0–0.1)
HEMATOCRIT: 31.7 % — ABNORMAL LOW (ref 41.0–53.0)
HEMOGLOBIN: 10.5 g/dL — ABNORMAL LOW (ref 13.5–17.5)
LYMPHOCYTES ABSOLUTE COUNT: 0.5 10*9/L — ABNORMAL LOW (ref 1.5–5.0)
MEAN CORPUSCULAR HEMOGLOBIN CONC: 33.1 g/dL (ref 31.0–37.0)
MEAN CORPUSCULAR HEMOGLOBIN: 30.4 pg (ref 26.0–34.0)
MEAN CORPUSCULAR VOLUME: 91.8 fL (ref 80.0–100.0)
MEAN PLATELET VOLUME: 8.9 fL (ref 7.0–10.0)
MONOCYTES ABSOLUTE COUNT: 0.2 10*9/L (ref 0.2–0.8)
NEUTROPHILS ABSOLUTE COUNT: 2.7 10*9/L (ref 2.0–7.5)
PLATELET COUNT: 97 10*9/L — ABNORMAL LOW (ref 150–440)
RED CELL DISTRIBUTION WIDTH: 16.3 % — ABNORMAL HIGH (ref 12.0–15.0)
WBC ADJUSTED: 3.5 10*9/L — ABNORMAL LOW (ref 4.5–11.0)

## 2017-02-28 LAB — COMPREHENSIVE METABOLIC PANEL
ALBUMIN: 3.8 g/dL (ref 3.5–5.0)
ALKALINE PHOSPHATASE: 192 U/L — ABNORMAL HIGH (ref 38–126)
ALT (SGPT): 28 U/L (ref 19–72)
ANION GAP: 12 mmol/L (ref 9–15)
AST (SGOT): 23 U/L (ref 19–55)
BLOOD UREA NITROGEN: 23 mg/dL — ABNORMAL HIGH (ref 7–21)
BUN / CREAT RATIO: 16
CALCIUM: 9.5 mg/dL (ref 8.5–10.2)
CHLORIDE: 102 mmol/L (ref 98–107)
CO2: 22 mmol/L (ref 22.0–30.0)
EGFR MDRD AF AMER: >=60 mL/min/{1.73_m2} (ref >=60)
EGFR MDRD NON AF AMER: 49 mL/min/{1.73_m2} — ABNORMAL LOW (ref >=60)
GLUCOSE RANDOM: 143 mg/dL (ref 65–179)
POTASSIUM: 4.5 mmol/L (ref 3.5–5.0)
PROTEIN TOTAL: 6 g/dL — ABNORMAL LOW (ref 6.5–8.3)
SODIUM: 136 mmol/L (ref 135–145)

## 2017-02-28 LAB — BLOOD GAS CRITICAL CARE PANEL, VENOUS
BASE EXCESS VENOUS: 1.3 (ref -2.0–2.0)
CALCIUM IONIZED VENOUS (MG/DL): 4.84 mg/dL (ref 4.40–5.40)
GLUCOSE WHOLE BLOOD: 142 mg/dL
HCO3 VENOUS: 25 mmol/L (ref 22–27)
HEMOGLOBIN BLOOD GAS: 10.1 g/dL — ABNORMAL LOW (ref 13.50–17.50)
LACTATE BLOOD VENOUS: 0.8 mmol/L (ref 0.5–1.8)
O2 SATURATION VENOUS: 84.2 % (ref 40.0–85.0)
PCO2 VENOUS: 34 mmHg — ABNORMAL LOW (ref 40–60)
PH VENOUS: 7.47 — ABNORMAL HIGH (ref 7.32–7.43)
POTASSIUM WHOLE BLOOD: 4.3 mmol/L (ref 3.4–4.6)
SODIUM WHOLE BLOOD: 134 mmol/L — ABNORMAL LOW (ref 135–145)

## 2017-02-28 LAB — INR: Lab: 1.49

## 2017-02-28 LAB — URINALYSIS WITH CULTURE REFLEX
BLOOD UA: NEGATIVE
GLUCOSE UA: NEGATIVE
KETONES UA: NEGATIVE
LEUKOCYTE ESTERASE UA: NEGATIVE
NITRITE UA: NEGATIVE
PH UA: 7 (ref 5.0–9.0)
RBC UA: <1 /HPF (ref <3)
SPECIFIC GRAVITY UA: 1.018 (ref 1.003–1.030)
SQUAMOUS EPITHELIAL: <1 /HPF (ref 0–5)
UROBILINOGEN UA: 0.2
WBC UA: 1 /HPF (ref <2)

## 2017-02-28 LAB — AMMONIA: Ammonia:SCnc:Pt:Plas:Qn:: <9 — ABNORMAL LOW

## 2017-02-28 LAB — ANION GAP: Anion gap 3:SCnc:Pt:Ser/Plas:Qn:: 12

## 2017-02-28 LAB — PROTIME-INR: PROTIME: 17 s — ABNORMAL HIGH (ref 10.2–12.8)

## 2017-02-28 LAB — CYCLOSPORINE (FPIA) BLOOD: Lab: 78

## 2017-02-28 LAB — HEPARIN CORRELATION: Lab: 0.2

## 2017-02-28 LAB — CALCIUM IONIZED VENOUS (MG/DL): Calcium.ionized:MCnc:Pt:Bld:Qn:: 4.84

## 2017-02-28 LAB — LYMPHOCYTES ABSOLUTE COUNT: Lab: 0.5 — ABNORMAL LOW

## 2017-02-28 LAB — GLUCOSE UA: Lab: NEGATIVE

## 2017-02-28 MED FILL — DIGOXIN/0.125MG/TABS: DIGOXIN/0.125MG/TABS | 30 days supply | Qty: 15 | Fill #2

## 2017-02-28 NOTE — Unmapped (Signed)
Patient returned from X-ray  Transported by Radiology  How tranported Stretcher  Cardiac Monitor yes

## 2017-02-28 NOTE — Unmapped (Signed)
Lunch tray ordered 

## 2017-02-28 NOTE — Unmapped (Signed)
[facility]  Emergency Department Provider Note    ED Clinical Impression     Final diagnoses:   Influenza A (Primary)     Initial Impression, ED Course, Assessment and Plan     Impression: 66 y.o. 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 Tachycardic and febrile at presentation. Clinical picture considering for undifferentiated sepsis. Will obtain blood cx, bloodwork, influenza, CXR. Will give IV fluids, Vanc/Cefe.     1:48 PM  Influenza +. Will give Tamiflu and admit for ongoing management.     Additional Medical Decision Making     I have reviewed the vital signs and the nursing notes. Labs and radiology results that were available during my care of the patient were independently reviewed by me and considered in my medical decision making.     I staffed the case with the ED attending, Dr. Izola Price    I independently visualized the EKG tracing.   I independently visualized the radiology images.   I reviewed the patient's prior medical records.   I discussed the case with the admitting provider.      Portions of this record have been created using Scientist, clinical (histocompatibility and immunogenetics). Dictation errors have been sought, but may not have been identified and corrected.  ____________________________________________       History     Chief Complaint  Fever Between 16 Weeks and 66 Years Old      HPI   Jeffrey Ward is a 66 y.o. male past medical history of carcinoma of the liver status post liver transplant immunosuppressed on mycophenolate and cyclosporine, insulin-dependent diabetes, peripheral neuropathy, heart failure EF 9/18 > 55%, afib w/RVR on warfarin, status post liver transplant July 2018 presenting with a fever.  Patient has been experiencing emesis due to severe coughing episodes, he experienced a fever last night of 101 ??F, took 500 mg of Tylenol at 11:30 PM.  Had decreased oral intake for the last 24 hours, nasal drainage and discharge.  He has not experienced any jaundice or pruritus.    He underwent liver transplant September 15, 2016, his postoperative course was complicated by decompensation secondary to acute heart failure, acute kidney injury which required CRRT and subsequently resolved, severe pulmonary hypertension, bradycardia requiring a pacemaker, atrial fibrillation requiring anticoagulation with warfarin and amiodarone/digoxin, dysphasia requiring a G-tube to be placed, altered mental status and delirium which improved with transition to cyclosporine as well as psychiatric medications.  He was sent to twice in November with elevated liver function tests require no intervention.       Past Medical History:   Diagnosis Date   ??? Cancer (CMS-HCC)    ??? Cirrhosis (CMS-HCC)    ??? Diabetes (CMS-HCC)    ??? Hepatitis C 07/17/2012   ??? Liver disease    ??? Low back pain 07/17/2012   ??? Varices, esophageal (CMS-HCC)        Patient Active Problem List   Diagnosis   ??? Hepatitis C   ??? Low back pain   ??? Hematuria   ??? Calculus of ureter   ??? Anemia   ??? Benign prostatic hyperplasia   ??? Chronic pain disorder   ??? Dysrhythmia   ??? History of gastroesophageal reflux (GERD)   ??? History of hepatitis C virus infection   ???  History of substance abuse   ??? Hyperglycemia, unspecified   ??? Intermittent vertigo   ??? Microscopic hematuria   ??? Postlaminectomy syndrome, lumbar region   ??? Thrombocytopenia (CMS-HCC)   ??? Vitamin D deficiency   ??? Atrial fibrillation with RVR (CMS-HCC)   ??? Secondary esophageal varices (CMS-HCC)   ??? Right ventricular dilation   ??? End-stage liver disease (CMS-HCC)   ??? HCC (hepatocellular carcinoma) (CMS-HCC)   ??? Paroxysmal atrial fibrillation (CMS-HCC)   ??? Hepatic encephalopathy (CMS-HCC)   ??? S/P liver transplant (CMS-HCC)   ??? Pulmonary hypertension, moderate to severe (CMS-HCC)   ??? Acute right heart failure (CMS-HCC)   ??? Tracheostomy dependent (CMS-HCC)   ??? Mixed level of activity delirium due to multiple etiologies, resolved   ??? Insomnia   ??? Peripheral neuropathy       Past Surgical History:   Procedure Laterality Date   ??? ANKLE SURGERY     ??? BACK SURGERY     ??? BACK SURGERY     ??? CHG US GUIDE, TISSUE ABLATION N/A 01/22/2016    Procedure: ULTRASOUND GUIDANCE FOR, AND MONITORING OF, PARENCHYMAL TISSUE ABLATION;  Surgeon: Particia Nearing, MD;  Location: MAIN OR [facility];  Service: Transplant   ??? IR EMBOLIZATION ORGAN ISCHEMIA, TUMORS, INFAR  06/16/2016    IR EMBOLIZATION ORGAN ISCHEMIA, TUMORS, INFAR 06/16/2016 Ammie Dalton, MD IMG VIR [facility]   ??? PR COLON CA SCRN NOT HI RSK IND N/A 02/27/2015    Procedure: COLOREC CNCR SCR;COLNSCPY NO;  Surgeon: Vonda Antigua, MD;  Location: [facility]URES [facility];  Service: Gastroenterology   ??? PR ENDOSCOPIC ULTRASOUND EXAM N/A 02/27/2015    Procedure: UGI ENDO; W/ENDO ULTRASOUND EXAM INCLUDES ESOPHAGUS, STOMACH, &/OR DUODENUM/JEJUNUM;  Surgeon: Vonda Antigua, MD;  Location: GI PROCEDURES [facility];  Service: Gastroenterology   ??? PR ERCP BALLOON DILATE BILIARY/PANC DUCT/AMPULLA EA N/A 02/10/2017    Procedure: ERCP;WITH TRANS-ENDOSCOPIC BALLOON DILATION OF BILIARY/PANCREATIC DUCT(S) OR OF AMPULLA, INCLUDING SPHINCTERECTOMY, WHEN PERFOREMD,EACH DUCT (25956);  Surgeon: Mayford Knife, MD;  Location: GI PROCEDURES [facility];  Service: Gastroenterology   ??? PR ERCP REMOVE FOREIGN BODY/STENT BILIARY/PANC DUCT N/A 02/10/2017    Procedure: ENDOSCOPIC RETROGRADE CHOLANGIOPANCREATOGRAPHY (ERCP); W/ REMOVAL OF FOREIGN BODY/STENT FROM BILIARY/PANCREATIC DUCT(S);  Surgeon: Mayford Knife, MD;  Location: GI PROCEDURES [facility];  Service: Gastroenterology   ??? PR ERCP STENT PLACEMENT BILIARY/PANCREATIC DUCT N/A 11/29/2016    Procedure: ENDOSCOPIC RETROGRADE CHOLANGIOPANCREATOGRAPHY (ERCP); WITH PLACEMENT OF ENDOSCOPIC STENT INTO BILIARY OR PANCREATIC DUCT;  Surgeon: Chriss Driver, MD;  Location: GI PROCEDURES [facility];  Service: Gastroenterology   ??? PR ERCP,W/REMOVAL STONE,BIL/PANCR DUCTS N/A 11/29/2016    Procedure: ERCP; W/ENDOSCOPIC RETROGRADE REMOVAL OF CALCULUS/CALCULI FROM BILIARY &/OR PANCREATIC DUCTS;  Surgeon: Chriss Driver, MD;  Location: GI PROCEDURES [facility];  Service: Gastroenterology   ??? PR ERCP,W/REMOVAL STONE,BIL/PANCR DUCTS N/A 02/10/2017    Procedure: ERCP; W/ENDOSCOPIC RETROGRADE REMOVAL OF CALCULUS/CALCULI FROM BILIARY &/OR PANCREATIC DUCTS;  Surgeon: Mayford Knife, MD;  Location: GI PROCEDURES [facility];  Service: Gastroenterology   ??? PR INSER HEART TEMP PACER ONE CHMBR N/A 10/02/2016    Procedure: Tempoarary Pacemaker Insertion;  Surgeon: Meredith Leeds, MD;  Location: [facility];  Service: Cardiology   ??? PR LAP,DIAGNOSTIC ABDOMEN N/A 01/22/2016    Procedure: Laparoscopy, Abdomen, Peritoneum, & Omentum, Diagnostic, W/Wo Collection Specimen(S) By Brushing Or Washing;  Surgeon: Particia Nearing, MD;  Location: MAIN OR [facility];  Service: Transplant   ??? PR PLACE PERCUT  GASTROSTOMY TUBE N/A 11/17/2016    Procedure: UGI ENDO; W/DIRECTED PLCMT PERQ GASTROSTOMY TUBE;  Surgeon: Cletis Athens, MD;  Location: GI PROC[facility]URES [facility];  Service: Gastroenterology   ??? PR TRACHEOSTOMY, PLANNED N/A 09/29/2016    Procedure: TRACHEOSTOMY PLANNED (SEPART PROC);  Surgeon: Katherina Mires, MD;  Location: MAIN OR [facility];  Service: Trauma   ??? PR TRANSCATH INSERT OR REPLACE LEADLESS PM VENTR N/A 10/11/2016    Procedure: Pacemaker Implant/Replace Leadless;  Surgeon: Meredith Leeds, MD;  Location: [facility];  Service: Cardiology   ??? PR TRANSPLANT LIVER,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    Procedure: Liver Allotransplantation; Orthotopic, Partial Or Whole, From Cadaver Or Living Donor, Any Age;  Surgeon: Doyce Loose, MD;  Location: MAIN OR [facility];  Service: Transplant   ??? PR TRANSPLANT,PREP DONOR LIVER, WHOLE N/A 09/15/2016 Procedure: Rogelia Boga Std Prep Cad Donor Whole Liver Gft Prior Tnsplnt,Inc Chole,Diss/Rem Surr Tissu Wo Triseg/Lobe Splt;  Surgeon: Doyce Loose, MD;  Location: MAIN OR [facility];  Service: Transplant   ??? PR UPPER GI ENDOSCOPY,BIOPSY N/A 07/17/2012    Procedure: UGI ENDOSCOPY; WITH BIOPSY, SINGLE OR MULTIPLE;  Surgeon: Alba Destine, MD;  Location: GI PROCEDURES [facility];  Service: Gastroenterology   ??? PR UPPER GI ENDOSCOPY,DIAGNOSIS N/A 02/04/2014    Procedure: UGI ENDO, INCLUDE ESOPHAGUS, STOMACH, & DUODENUM &/OR JEJUNUM; DX W/WO COLLECTION SPECIMN, BY BRUSH OR WASH;  Surgeon: Wilburt Finlay, MD;  Location: GI PROCEDURES [facility];  Service: Gastroenterology   ??? PR UPPER GI ENDOSCOPY,LIGAT VARIX N/A 11/05/2013    Procedure: UGI ENDO; W/BAND LIG ESOPH &/OR GASTRIC VARICES;  Surgeon: Wilburt Finlay, MD;  Location: GI PROCEDURES [facility];  Service: Gastroenterology       No current facility-administered medications for this encounter.     Current Outpatient Prescriptions:   ???  acetaminophen (TYLENOL) 325 MG tablet, Take 1 tablet (325 mg total) by mouth every four (4) hours as needed for pain., Disp: 100 tablet, Rfl: 2  ???  blood sugar diagnostic (FREESTYLE TEST) Strp, by Other route Four (4) times a day (before meals and nightly)., Disp: 100 each, Rfl: 11  ???  blood-glucose meter (GLUCOSE MONITORING KIT) kit, Use as instructed, Disp: 1 each, Rfl: 0  ???  capsaicin (ZOSTRIX) 0.025 % cream, Apply topically Two (2) times a day., Disp: 60 g, Rfl: 0  ???  cholecalciferol, vitamin D3, 2,000 unit cap, 1 capsule (2,000 Units total) by PEG Tube route daily., Disp: 30 capsule, Rfl: 11  ???  digoxin (LANOXIN) 125 mcg tablet, 0.5 tablets (62.5 mcg total) by G-tube route daily., Disp: 15 tablet, Rfl: 11  ???  insulin ASPART (NOVOLOG FLEXPEN U-100 INSULIN) 100 unit/mL injection pen, Inject 0.06 mL (6 Units total) under the skin Three (3) times a day before meals., Disp: 4 mL, Rfl: 11  ???  insulin ASPART (NOVOLOG FLEXPEN) 100 unit/mL injection pen, Inject 2 Units under the skin Three (3) times a day before meals. per SSI-  151-200= 2 units,  201-250= 4 units, 251-300=add 6 units, 301-350=8 units, 351-400=10 units, >400 =12 units, Disp: , Rfl:   ???  insulin glargine (LANTUS) 100 unit/mL (3 mL) injection pen, Inject 0.12 mL (12 Units total) under the skin nightly., Disp: 4 mL, Rfl: 11  ???  lancets Misc, 1 each by Miscellaneous route Four (4) times a day (before meals and nightly)., Disp: 100 each, Rfl: 11  ???  magnesium oxide-Mg AA chelate 133 mg Tab, Take 2 tablets by mouth Two (2) times  a day. Picking up today, Disp: 60 tablet, Rfl: 0  ???  melatonin 3 mg Tab, Take 1 tablet (3 mg total) by mouth nightly., Disp: 30 tablet, Rfl: 0  ???  mycophenolate (CELLCEPT) 250 mg capsule, Take 2 capsules (500 mg total) by mouth Two (2) times a day., Disp: 360 capsule, Rfl: 3  ???  NEORAL 100 mg capsule, 200 mg in am and 175 mg in pm (two 100 mg capsules in am and one 100 mg and three 25 mg capsules), Disp: 90 capsule, Rfl: 11  ???  NEORAL 25 mg capsule, 200 mg in am and 175 mg in pm (two 100 mg capsules in am and one 100 mg and three 25 mg capsules), Disp: 90 capsule, Rfl: 11  ???  omeprazole (PRILOSEC) 20 MG capsule, Take 1 capsule (20 mg total) by mouth daily., Disp: 30 capsule, Rfl: 1  ???  pen needle, diabetic (ULTICARE PEN NEEDLE) 32 gauge x 5/32 Ndle, 1 each by Miscellaneous route Four (4) times a day (before meals and nightly)., Disp: 90 each, Rfl: 3  ???  sildenafil, antihypertensive, (REVATIO) 20 mg tablet, Take 2 tablets (40 mg total) by mouth Three (3) times a day., Disp: 180 tablet, Rfl: 11  ???  sulfamethoxazole-trimethoprim (BACTRIM,SEPTRA) 400-80 mg per tablet, Take 1 tablet (80 mg of trimethoprim total) by mouth 3 (three) times a week., Disp: 27 tablet, Rfl: 0  ???  traMADol (ULTRAM) 50 mg tablet, Take 50 mg by mouth. Every 4 to 6 hours PRN, Disp: , Rfl:   ???  warfarin (COUMADIN) 1 MG tablet, Take 3 tablets (3 mg total) by mouth daily with evening meal., Disp: 72 tablet, Rfl: 11    Allergies  Patient has no known allergies.    Family History   Problem Relation Age of Onset   ??? Hypertension Mother    ??? Cirrhosis Neg Hx    ??? Liver cancer Neg Hx    ??? Anemia Neg Hx    ??? Cancer Neg Hx    ??? Diabetes Neg Hx    ??? Kidney disease Neg Hx    ??? Obesity Neg Hx    ??? Thyroid disease Neg Hx    ??? Osteoporosis Neg Hx    ??? Coronary artery disease Neg Hx        Social History  Social History   Substance Use Topics   ??? Smoking status: Never Smoker   ??? Smokeless tobacco: Never Used      Comment: Smoked in high school for about 5 years.    ??? Alcohol use No       Review of Systems  Constitutional: See HPI  Eyes: Negative for visual changes.  ENT: Negative for sore throat.  CV: Negative for chest pain.  Resp: Negative for shortness of breath.  GI: See HPI  GU: Negative for dysuria.  MSK: Negative for back pain.  Derm: Negative for rash.  Neuro: Negative for headaches.      Physical Exam     ED Triage Vitals [02/28/17 1158]   Enc Vitals Group      BP 133/70      Heart Rate 100      SpO2 Pulse       Resp 22      Temp 38.1 ??C (100.5 ??F)      Temp Source Oral      SpO2       Weight 61.2 kg (135 lb)      Height  Head Circumference       Peak Flow       Pain Score       Pain Loc       Pain Edu?       Excl. in GC?        Constitutional: Appears stated age.  HEENT: Normocephalic and atraumatic.Conjunctivae clear. No congestion. Moist mucous membranes.   Heme/Lymph/Immuno: No petechiae or bruising  CV: RRR, no murmurs. Symmetric pulses in all extremities.  Resp: Clear to auscultation bilaterally. No wheezes or rhonchii  GI: Soft and non tender, non distended.   GU: There is no CVA tenderness.   MSK: Normal range of motion in all extremities.  Neuro: Normal speech and language. No gross focal neurologic deficits appreciated.  Skin: Warm, dry and intact.  Psychiatric: Mood and affect are normal. Speech and behavior are normal.    Radiology     CXR  Clear lungs.              Loma Sousa, MD Resident  02/28/17 [phone]

## 2017-02-28 NOTE — Unmapped (Signed)
Patient rounds completed. The following patient needs were addressed:  Toileting, Personal Belongings, Plan of Care and bed is low and locked, and call bell is within reach. Pt give two blankets per request.

## 2017-02-28 NOTE — Unmapped (Signed)
Liver transplant pt (July 2018).

## 2017-02-28 NOTE — Unmapped (Signed)
Attending at bedside.

## 2017-02-28 NOTE — Unmapped (Signed)
Patient rounds completed. The following patient needs were addressed:  Pain, Toileting, Positioning;   SUPINE, Personal Belongings, Plan of Care, Call Bell in Reach and Bed Position Low . HOB elevated. VSS. NAD. Wife at bedside

## 2017-02-28 NOTE — Unmapped (Addendum)
Patient with vomiting due to coughing to so hard.  101 fever at 2300 last night took Tylenol 500 mg at 2330.  Wife stated patient refused to come to ER last night.  This morning patient fever 99 at 0800 and took 325 mg of Tylenol afterward.  Patient has had 40 ounces of water over night.  Has not eaten any food since 1300 yesterday.  Patient with nasal drainage.  Denied any jaundice, itching.  Has not had labs drawn today.  Per Dr. Celine Mans patient needs to come to ER to be urgently evaluated for the fever.  Patient agreed to come to the ER.  Called [facility] ER Charge nurse to make aware.      Patient paged on-call coordinator within the hour of this conversation but did not answer when called by on-call coordinator and myself the primary coordinator.  Left VM to let patient know he can call me but that we are expecting him at the ER soon.

## 2017-02-28 NOTE — Unmapped (Signed)
Patient rounds completed. The following patient needs were addressed:  Pain, Toileting, Positioning;   SUPINE, Personal Belongings, Plan of Care, Call Bell in Reach and Bed Position Low . HOB elevated. VSS. NAD

## 2017-02-28 NOTE — Unmapped (Signed)
Patient transported to X-ray  Transported by Radiology  How tranported Stretcher  Cardiac Monitor yes

## 2017-02-28 NOTE — Unmapped (Signed)
Phone placed to kitchen about re-routing pt's lunch tray to floor

## 2017-02-28 NOTE — Unmapped (Signed)
Last chemo is in June, 2018

## 2017-02-28 NOTE — Unmapped (Signed)
Pt reports fever (tmax 102), weakness and fatigue for the past 5 days.

## 2017-02-28 NOTE — Unmapped (Signed)
Patient rounds completed. The following patient needs were addressed:  Pain, Toileting, Positioning;   SUPINE, Personal Belongings, Plan of Care, Call Bell in Reach and Bed Position Low . HOB elevated. Wife at bedside

## 2017-02-28 NOTE — Unmapped (Signed)
Patient rounds completed. The following patient needs were addressed:  Toileting, Personal Belongings, Plan of Care and bed is low and locked, and call bell is within reach.

## 2017-03-01 LAB — CYCLOSPORINE (FPIA) BLOOD: Lab: 67

## 2017-03-01 LAB — CBC
HEMATOCRIT: 28 % — ABNORMAL LOW (ref 41.0–53.0)
HEMOGLOBIN: 9.3 g/dL — ABNORMAL LOW (ref 13.5–17.5)
MEAN CORPUSCULAR HEMOGLOBIN CONC: 33.1 g/dL (ref 31.0–37.0)
MEAN CORPUSCULAR HEMOGLOBIN: 30.9 pg (ref 26.0–34.0)
MEAN PLATELET VOLUME: 9.1 fL (ref 7.0–10.0)
RED BLOOD CELL COUNT: 3 10*12/L — ABNORMAL LOW (ref 4.50–5.90)
RED CELL DISTRIBUTION WIDTH: 16.2 % — ABNORMAL HIGH (ref 12.0–15.0)
WBC ADJUSTED: 3.3 10*9/L — ABNORMAL LOW (ref 4.5–11.0)

## 2017-03-01 LAB — COMPREHENSIVE METABOLIC PANEL
ALBUMIN: 3 g/dL — ABNORMAL LOW (ref 3.5–5.0)
ANION GAP: 8 mmol/L — ABNORMAL LOW (ref 9–15)
AST (SGOT): 17 U/L — ABNORMAL LOW (ref 19–55)
BILIRUBIN TOTAL: 0.8 mg/dL (ref 0.0–1.2)
BLOOD UREA NITROGEN: 18 mg/dL (ref 7–21)
BUN / CREAT RATIO: 14
CALCIUM: 8.8 mg/dL (ref 8.5–10.2)
CHLORIDE: 105 mmol/L (ref 98–107)
CO2: 22 mmol/L (ref 22.0–30.0)
CREATININE: 1.26 mg/dL (ref 0.70–1.30)
EGFR MDRD AF AMER: >=60 mL/min/{1.73_m2} (ref >=60)
EGFR MDRD NON AF AMER: 57 mL/min/{1.73_m2} — ABNORMAL LOW (ref >=60)
GLUCOSE RANDOM: 162 mg/dL (ref 65–179)
POTASSIUM: 3.9 mmol/L (ref 3.5–5.0)
SODIUM: 135 mmol/L (ref 135–145)

## 2017-03-01 LAB — PROTEIN TOTAL: Protein:MCnc:Pt:Ser/Plas:Qn:: 5.1 — ABNORMAL LOW

## 2017-03-01 LAB — INR: Lab: 1.41

## 2017-03-01 LAB — WBC ADJUSTED: Lab: 3.3 — ABNORMAL LOW

## 2017-03-01 MED ORDER — OSELTAMIVIR 30 MG CAPSULE
ORAL_CAPSULE | Freq: Two times a day (BID) | ORAL | 0 refills | 0.00000 days | Status: CP
Start: 2017-03-01 — End: 2017-03-03

## 2017-03-01 MED ORDER — WARFARIN 1 MG TABLET
ORAL_TABLET | Freq: Every day | ORAL | 0 refills | 0.00000 days | Status: CP
Start: 2017-03-01 — End: 2017-06-22

## 2017-03-01 MED ORDER — INSULIN ASPART (U-100) 100 UNIT/ML (3 ML) SUBCUTANEOUS PEN
0 refills | 0.00000 days
Start: 2017-03-01 — End: 2017-09-25

## 2017-03-01 NOTE — Unmapped (Signed)
GASTROENTEROLOGY INPATIENT CONSULTATION NOTE    Requesting Attending Physician :  Jacqualin Combes, MD    Reason for Consult:    Jeffrey Ward is a 66 y.o. male seen in consultation at the request of Dr. Jacqualin Combes, MD for evaluation of  immunosuppresion management in a post transplant patient with flu.    ASSESSMENT    Jeffrey Ward is a 66 year old gentleman s/p OLT 09/2016 2/2 hep C/HCC.  Post transplant course c/b biliary stricture requiring stenting, acute HF, pulm hypertension, AKI.  Presents to [facility] with fever, weakness, cough.  Has tested positive for influenza A.  Now on oseltamivir.    Jeffrey Ward says he feels significantly better on the oseltamivir. No longer febrile. Feeling stronger. No longer with significant cough. Would continue with oseltamivir.  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    Given how well he is recovering, do not need to reduce immunosuppression at this time.  In the setting of infection, we often reduce immunosuppression.  However, given that he is recovering, and is clinically stable, would continue with cellcept 500 mg BID and cyclosporine 200 q AM and 175 q PM.  Goal trough 150-200.    PLAN    --continue cyclosporin and cellcept at home doses  --continue tamiflu.  Would consider ID consult if he does not continue to improve  --daily LFTs, cyclosporin trough.  Goal trough 150-200    Thank you for this consult.  Please page [phone] with questions.    Ace Gins, NP-C  Transplant, Hepatology      Chief Complaint:    Fevers, chills, cough, malaise    History of Present Illness:   This is a 67 y.o. year old male with h/o OLT July 2018 2/2 hep C/HCC.  His post transplant course has been c/b acute HF, a fib, AKI, pulmonary HTN, bradycardia, biliary stricture requiring stenting.  No h/o rejection.  He is on cyclosporin 200 mg in AM and 175 in PM.  Goal trough is 150-200.  He is also on cellcept 500 mg BID.  Presented to [facility] hospital with h/o fevers, cough, weakness.  Was found to be influenza A positive, and started on Tamiflu.    Jeffrey Ward states he is feeling significantly better today.  He was febrile on admission, but has been afebrile since.  He says his energy is better. Denying any nausea, vomiting or abdominal pain today.  Cough is improving. Taking good PO.    Review of Systems:  The balance of 12 systems reviewed is negative except as noted in the HPI.     Past Medical History:  Past Medical History:   Diagnosis Date   ??? Cancer (CMS-HCC)    ??? Cirrhosis (CMS-HCC)    ??? Diabetes (CMS-HCC)    ??? Hepatitis C 07/17/2012   ??? Liver disease    ??? Low back pain 07/17/2012   ??? Varices, esophageal (CMS-HCC)        Surgical History:  Past Surgical History:   Procedure Laterality Date   ??? ANKLE SURGERY     ??? BACK SURGERY     ??? BACK SURGERY     ??? CHG US GUIDE, TISSUE ABLATION N/A 01/22/2016    Procedure: ULTRASOUND GUIDANCE FOR, AND MONITORING OF, PARENCHYMAL TISSUE ABLATION;  Surgeon: Particia Nearing, MD;  Location: MAIN OR [facility];  Service: Transplant   ??? IR EMBOLIZATION ORGAN ISCHEMIA, TUMORS, INFAR  06/16/2016    IR EMBOLIZATION ORGAN ISCHEMIA,  TUMORS, INFAR 06/16/2016 Ammie Dalton, MD IMG VIR H&V [facility]   ??? PR COLON CA SCRN NOT HI RSK IND N/A 02/27/2015    Procedure: COLOREC CNCR SCR;COLNSCPY NO;  Surgeon: Vonda Antigua, MD;  Location: GI PROCEDURES MEMORIAL [facility];  Service: Gastroenterology   ??? PR ENDOSCOPIC ULTRASOUND EXAM N/A 02/27/2015    Procedure: UGI ENDO; W/ENDO ULTRASOUND EXAM INCLUDES ESOPHAGUS, STOMACH, &/OR DUODENUM/JEJUNUM;  Surgeon: Vonda Antigua, MD;  Location: GI PROCEDURES MEMORIAL [facility];  Service: Gastroenterology   ??? PR ERCP BALLOON DILATE BILIARY/PANC DUCT/AMPULLA EA N/A 02/10/2017    Procedure: ERCP;WITH TRANS-ENDOSCOPIC BALLOON DILATION OF BILIARY/PANCREATIC DUCT(S) OR OF AMPULLA, INCLUDING SPHINCTERECTOMY, WHEN PERFOREMD,EACH DUCT (54098);  Surgeon: Mayford Knife, MD;  Location: GI PROCEDURES MEMORIAL [facility];  Service: Gastroenterology   ??? PR ERCP REMOVE FOREIGN BODY/STENT BILIARY/PANC DUCT N/A 02/10/2017    Procedure: ENDOSCOPIC RETROGRADE CHOLANGIOPANCREATOGRAPHY (ERCP); W/ REMOVAL OF FOREIGN BODY/STENT FROM BILIARY/PANCREATIC DUCT(S);  Surgeon: Mayford Knife, MD;  Location: GI PROCEDURES MEMORIAL [facility];  Service: Gastroenterology   ??? PR ERCP STENT PLACEMENT BILIARY/PANCREATIC DUCT N/A 11/29/2016    Procedure: ENDOSCOPIC RETROGRADE CHOLANGIOPANCREATOGRAPHY (ERCP); WITH PLACEMENT OF ENDOSCOPIC STENT INTO BILIARY OR PANCREATIC DUCT;  Surgeon: Chriss Driver, MD;  Location: GI PROCEDURES MEMORIAL [facility];  Service: Gastroenterology   ??? PR ERCP,W/REMOVAL STONE,BIL/PANCR DUCTS N/A 11/29/2016    Procedure: ERCP; W/ENDOSCOPIC RETROGRADE REMOVAL OF CALCULUS/CALCULI FROM BILIARY &/OR PANCREATIC DUCTS;  Surgeon: Chriss Driver, MD;  Location: GI PROCEDURES MEMORIAL [facility];  Service: Gastroenterology   ??? PR ERCP,W/REMOVAL STONE,BIL/PANCR DUCTS N/A 02/10/2017    Procedure: ERCP; W/ENDOSCOPIC RETROGRADE REMOVAL OF CALCULUS/CALCULI FROM BILIARY &/OR PANCREATIC DUCTS;  Surgeon: Mayford Knife, MD;  Location: GI PROCEDURES MEMORIAL [facility];  Service: Gastroenterology   ??? PR INSER HEART TEMP PACER ONE CHMBR N/A 10/02/2016    Procedure: Tempoarary Pacemaker Insertion;  Surgeon: Meredith Leeds, MD;  Location: [facility];  Service: Cardiology   ??? PR LAP,DIAGNOSTIC ABDOMEN N/A 01/22/2016    Procedure: Laparoscopy, Abdomen, Peritoneum, & Omentum, Diagnostic, W/Wo Collection Specimen(S) By Brushing Or Washing;  Surgeon: Particia Nearing, MD;  Location: MAIN OR [facility];  Service: Transplant   ??? PR PLACE PERCUT GASTROSTOMY TUBE N/A 11/17/2016    Procedure: UGI ENDO; W/DIRECTED PLCMT PERQ GASTROSTOMY TUBE;  Surgeon: Cletis Athens, MD;  Location: GI PROCEDURES MEMORIAL [facility];  Service: Gastroenterology   ??? PR TRACHEOSTOMY, PLANNED N/A 09/29/2016    Procedure: TRACHEOSTOMY PLANNED (SEPART PROC);  Surgeon: Katherina Mires, MD;  Location: MAIN OR [facility];  Service: Trauma   ??? PR TRANSCATH INSERT OR REPLACE LEADLESS PM VENTR N/A 10/11/2016    Procedure: Pacemaker Implant/Replace Leadless;  Surgeon: Meredith Leeds, MD;  Location: [facility] EP;  Service: Cardiology   ??? PR TRANSPLANT LIVER,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    Procedure: Liver Allotransplantation; Orthotopic, Partial Or Whole, From Cadaver Or Living Donor, Any Age;  Surgeon: Doyce Loose, MD;  Location: MAIN OR [facility];  Service: Transplant   ??? PR TRANSPLANT,PREP DONOR LIVER, WHOLE N/A 09/15/2016    Procedure: Rogelia Boga Std Prep Cad Donor Whole Liver Gft Prior Tnsplnt,Inc Chole,Diss/Rem Surr Tissu Wo Triseg/Lobe Splt;  Surgeon: Doyce Loose, MD;  Location: MAIN OR [facility];  Service: Transplant   ??? PR UPPER GI ENDOSCOPY,BIOPSY N/A 07/17/2012    Procedure: UGI ENDOSCOPY; WITH BIOPSY, SINGLE OR MULTIPLE;  Surgeon: Alba Destine, MD;  Location: GI PROCEDURES MEMORIAL [facility];  Service: Gastroenterology   ??? PR UPPER GI ENDOSCOPY,DIAGNOSIS N/A 02/04/2014    Procedure: UGI ENDO, INCLUDE  ESOPHAGUS, STOMACH, & DUODENUM &/OR JEJUNUM; DX W/WO COLLECTION SPECIMN, BY BRUSH OR WASH;  Surgeon: Wilburt Finlay, MD;  Location: GI PROCEDURES MEMORIAL [facility];  Service: Gastroenterology   ??? PR UPPER GI ENDOSCOPY,LIGAT VARIX N/A 11/05/2013    Procedure: UGI ENDO; W/BAND LIG ESOPH &/OR GASTRIC VARICES;  Surgeon: Wilburt Finlay, MD;  Location: GI PROCEDURES MEMORIAL [facility];  Service: Gastroenterology       Family History:  The patient's family history includes Hypertension in his mother..    Medications:   Current Facility-Administered Medications   Medication Dose Route Frequency Provider Last Rate Last Dose   ??? acetaminophen (TYLENOL) tablet 500 mg  500 mg Oral Q6H PRN Mackey Birchwood, MD   500 mg at 03/01/17 1308   ??? cholecalciferol (vitamin D3) tablet 2,000 Units  2,000 Units PEG Tube Daily Mackey Birchwood, MD   2,000 Units at 03/01/17 0839   ??? cycloSPORINE modified (NEORAL) capsule 175 mg  175 mg Oral Nightly Mackey Birchwood, MD   175 mg at 02/28/17 2143   ??? cycloSPORINE modified (NEORAL) capsule 200 mg  200 mg Oral Daily Mackey Birchwood, MD   200 mg at 03/01/17 0840   ??? dextrose 50 % in water (D50W) solution 12.5 g  12.5 g Intravenous Q10 Min PRN Mackey Birchwood, MD       ??? digoxin (LANOXIN) tablet 62.5 mcg  62.5 mcg G-tube Daily Mackey Birchwood, MD   62.5 mcg at 03/01/17 6578   ??? enoxaparin (LOVENOX) syringe 40 mg  40 mg Subcutaneous Q24H [facility] Amit Ringel, MD   40 mg at 03/01/17 0840   ??? insulin glargine (LANTUS) injection 12 Units  12 Units Subcutaneous Nightly Mackey Birchwood, MD   12 Units at 02/28/17 2332   ??? insulin lispro (HumaLOG) injection 0-12 Units  0-12 Units Subcutaneous ACHS Mackey Birchwood, MD       ??? melatonin tablet 3 mg  3 mg Oral Nightly Mackey Birchwood, MD   3 mg at 02/28/17 2153   ??? mycophenolate (CELLCEPT) capsule 500 mg  500 mg Oral BID Mackey Birchwood, MD   500 mg at 03/01/17 0847   ??? omeprazole (PriLOSEC) capsule 20 mg  20 mg Oral Daily Mackey Birchwood, MD   20 mg at 03/01/17 0839   ??? oseltamivir (TAMIFLU) capsule 30 mg  30 mg Oral BID Mackey Birchwood, MD   30 mg at 03/01/17 0841   ??? pneumococcal polysacchride (23-valps) (PNEUMOVAX) vaccine 0.5 mL  0.5 mL Subcutaneous During hospitalization Cleda Mccreedy, MD       ??? sulfamethoxazole-trimethoprim (BACTRIM,SEPTRA) 400-80 mg tablet 80 mg of trimethoprim  1 tablet Oral Once per day on Mon Wed Fri Amit Ringel, MD   80 mg of trimethoprim at 03/01/17 0847   ??? traMADol (ULTRAM) tablet 50 mg  50 mg Oral Q6H PRN Mackey Birchwood, MD       ??? warfarin (COUMADIN) tablet 3 mg  3 mg Oral Daily Mackey Birchwood, MD   3 mg at 02/28/17 1724       Allergies:  Patient has no known allergies.    Social History:  Social History     Social History Narrative   ??? No narrative on file       Objective:     Vital Signs:  Temp:  [37.2 ??C (98.9 ??F)-38.2 ??C (100.8 ??F)] 37.2 ??C (99 ??F)  Heart Rate:  [65-101] 72  SpO2 Pulse:  [78-84] 78  Resp:  [18-22] 20  BP: (104-154)/(57-89) 132/65  MAP (mmHg):  [  96] 96  SpO2:  [95 %-99 %] 99 %    Intake and Output:  I/O last 3 completed shifts:  In: 2660 [IV Piggyback:2660]  Out: 175 [Urine:175]    Physical Exam:  Normal comprehensive exam:     Constitutional:   Alert, oriented x 3, no acute distress, well nourished, and well hydrated.   Mental Status:   Thought organized, appropriate affect, pleasantly interactive, not anxious appearing.   HEENT:   PERRL, conjunctiva clear, anicteric, oropharynx clear, neck supple, no LAD.   Respiratory: Clear tp asci;tatopm, unlabored breathing.     Cardiac: Euvolemic, regular rate and rhythm, normal S1 and S2, no murmur.     Abdomen: Soft, normal bowel sounds, non-distended, non-tender, no organomegaly or masses.     Perianal/Rectal Exam Not performed.     Extremities:   No edema, well perfused.   Musculoskeletal: No joint swelling or tenderness noted, no deformities.     Skin: No rashes, jaundice or skin lesions noted.     Neuro: No focal deficits.          Diagnostic Studies:  I reviewed all pertinent diagnostic studies, including:      Labs:    CBC - Results in Past 2 Days  Result Component Current Result   WBC 3.3 (L) (03/01/2017)   RBC 3.00 (L) (03/01/2017)    Not in Time Range    Not in Time Range    Not in Time Range     BMP - Results in Past 2 Days  Result Component Current Result   Glucose Not in Time Range    Not in Time Range    Not in Time Range    Not in Time Range   CO2 22.0 (03/01/2017)   BUN 18 (03/01/2017)   Creatinine 1.26 (03/01/2017)   Calcium 8.8 (03/01/2017)     Coagulation - Results in Past 2 Days  Result Component Current Result   PT 16.1 (H) (03/01/2017)   INR 1.41 (03/01/2017)   APTT 35.2 (02/28/2017)     LFT's - Results in Past 2 Days  Result Component Current Result   ALT 27 (03/01/2017)   AST 17 (L) (03/01/2017)   Alkaline Phosphatase 143 (H) (03/01/2017)       Imaging:   None

## 2017-03-01 NOTE — Unmapped (Signed)
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    History of Present Illness:     Jeffrey Ward is a 66 y.o. male with PMHx significant for HepC cirrhosis and HCC s/p liver transplant (09/2016), paroxysmal atrial fibrillation, IDDM, and peripheral neuropathy that presented to [facility] with Influenza A infection.    Had previously seen Jeffrey Ward in clinic exactly 1 month ago. He was seen by Dr. 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    In the clinic, silver nitrate was applied to his stoma and outpatient follow-up was recommended for his persistent tracheocutaneous fistula. At that time, he had also opted for an in office injection laryngoplasty (TTH, Cymetra) for his dysphonia and glottic incompetence.  ??  The patient denies fevers, chills, vomiting, diarrhea, inability to lie flat, dysphagia, odynophagia, hemoptysis, hematemesis, changes in vision, changes in voice quality, otalgia, otorrhea, vertiginous symptoms, focal deficits, or other concerning symptoms.    Past Medical History     has a past medical history of Cancer (CMS-HCC); Cirrhosis (CMS-HCC); Diabetes (CMS-HCC); Hepatitis C (07/17/2012); Liver disease; Low back pain (07/17/2012); and Varices, esophageal (CMS-HCC).    Past Surgical History     has a past surgical history that includes Back surgery; pr upper gi endoscopy,biopsy (N/A, 07/17/2012); Back surgery; Ankle surgery; pr upper gi endoscopy,ligat varix (N/A, 11/05/2013); pr upper gi endoscopy,diagnosis (N/A, 02/04/2014); pr colon ca scrn not hi rsk ind (N/A, 02/27/2015); pr endoscopic ultrasound exam (N/A, 02/27/2015); chg US guide, tissue ablation (N/A, 01/22/2016); pr lap,diagnostic abdomen (N/A, 01/22/2016); IR Embolization Organ Ischemia, Tumors, Infar (06/16/2016); pr tracheostomy, planned (N/A, 09/29/2016); pr inser heart temp pacer one chmbr (N/A, 10/02/2016); pr transplant liver,allotransplant (N/A, 09/15/2016); pr transplant,prep donor liver, whole (N/A, 09/15/2016); pr transcath insert or replace leadless pm ventr (N/A, 10/11/2016); pr place percut gastrostomy tube (N/A, 11/17/2016); pr ercp stent placement biliary/pancreatic duct (N/A, 11/29/2016); pr ercp,w/removal stone,bil/pancr ducts (N/A, 11/29/2016); pr ercp balloon dilate biliary/panc duct/ampulla ea (N/A, 02/10/2017); pr ercp remove foreign body/stent biliary/panc duct (N/A, 02/10/2017); and pr ercp,w/removal stone,bil/pancr ducts (N/A, 02/10/2017).    Current Medications    Current Facility-Administered Medications   Medication Dose Route Frequency Provider Last Rate Last Dose   ??? acetaminophen (TYLENOL) tablet 500 mg  500 mg Oral Q6H PRN Mackey Birchwood, MD   500 mg at 03/01/17 2956   ??? cholecalciferol (vitamin D3) tablet 2,000 Units  2,000 Units PEG Tube Daily Mackey Birchwood, MD   2,000 Units at 02/28/17 1725   ??? cycloSPORINE modified (NEORAL) capsule 175 mg  175 mg Oral Nightly Mackey Birchwood, MD   175 mg at 02/28/17 2143   ??? cycloSPORINE modified (NEORAL) capsule 200 mg  200 mg Oral Daily Mackey Birchwood, MD       ??? dextrose 50 % in water (D50W) solution 12.5 g  12.5 g Intravenous Q10 Min PRN Mackey Birchwood, MD       ??? digoxin (LANOXIN) tablet 62.5 mcg  62.5 mcg G-tube Daily Mackey Birchwood, MD       ??? enoxaparin (LOVENOX) syringe 40 mg  40 mg Subcutaneous Q24H [facility] Mackey Birchwood, MD       ??? insulin glargine (LANTUS) injection 12 Units  12 Units Subcutaneous Nightly Mackey Birchwood, MD   12 Units at 02/28/17 2332   ??? insulin lispro (HumaLOG) injection 0-12 Units  0-12 Units Subcutaneous ACHS Mackey Birchwood, MD       ??? melatonin tablet 3 mg  3 mg Oral Nightly Mackey Birchwood, MD   3 mg at 02/28/17 2153   ??? mycophenolate (CELLCEPT) capsule 500 mg  500 mg Oral BID Mackey Birchwood, MD   500 mg at 02/28/17 2141   ??? omeprazole (PriLOSEC) capsule 20 mg  20 mg Oral Daily Mackey Birchwood, MD       ??? oseltamivir (TAMIFLU) capsule 30 mg  30 mg Oral BID Mackey Birchwood, MD   30 mg at 02/28/17 2141   ??? pneumococcal polysacchride (23-valps) (PNEUMOVAX) vaccine 0.5 mL  0.5 mL Subcutaneous During hospitalization Cleda Mccreedy, MD       ??? sulfamethoxazole-trimethoprim (BACTRIM,SEPTRA) 400-80 mg tablet 80 mg of trimethoprim  1 tablet Oral Once per day on Mon Wed Fri Amit Ringel, MD       ??? traMADol (ULTRAM) tablet 50 mg  50 mg Oral Q6H PRN Mackey Birchwood, MD       ??? warfarin (COUMADIN) tablet 3 mg  3 mg Oral Daily Mackey Birchwood, MD   3 mg at 02/28/17 1724       Allergies    No Known Allergies    Family History    Negative for bleeding disorders or free bleeding.     family history includes Hypertension in his mother.    Social History:     reports that he has never smoked. He has never used smokeless tobacco.   reports that he does not drink alcohol.   reports that he does not use drugs.    Review of Systems    A 12 system review of systems was performed and is negative other than that noted in the history of present illness.    Vital Signs  Blood pressure 154/72, pulse 82, temperature 37.6 ??C, temperature source Oral, resp. rate 19, height 172.7 cm (5' 8), weight 65 kg (143 lb 4.8 oz), SpO2 95 %.    Physical Exam    Constitutional:?? Vitals reviewed in chart, patient has normal appearance. Well nourished, well-developed, no acute distress  Voice:  Hoarse, breathy, weak voice  Respiration:?? Breathing comfortably, no stridor.  CV: No clubbing/cyanosis/edema in hands.   Eyes:?? extraocular motion intact, sclera normal.   Neuro:?? Alert and oriented times 3, Cranial nerves 2-12 intact and symmetric bilaterally.   Head and Face:?? Skin with no masses or lesions, sinuses nontender to palpation, facial nerve fully intact.   Salivary Glands:?? Parotid and submandibular glands normal bilaterally.   Ears:?? Normal tympanic membranes to otoscopy, normal hearing to whispered voice.   Nose:?? External nose midline, anterior rhinoscopy is normal with limited visualization just to the anterior interior turbinate.   Oral Cavity/Oropharynx/Lips:?? Normal mucous membranes, normal floor of mouth/tongue/oropharynx, no masses or lesions are noted.  Good dentition  Pharyngeal Walls:  No masses noted.  Neck/Lymph:  No lymphadenopathy, no thyroid masses. Previous tracheostoma still patent with 1-1.5cm TC fistula. Mild granulation tissue filling the fistula. No erythema or drainage or purulence noted.   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 male with PMHx significant for HepC cirrhosis and HCC s/p liver transplant (09/2016), paroxysmal atrial fibrillation, IDDM, and peripheral neuropathy that presented to [facility] with Influenza A infection. He has known right vocal fold paralysis and was set up to undergo a right vocal fold injection laryngoplasty as outpatient, (TTH, cymetra). It was also reviewed with patient and his TC fistula site would also likely need revision in the future.  - We will set up outpatient follow-up for these procedures. Given patient's acute medical problems requiring hospitalization, will defer further inpatient intervention at this time.      - Please page the ENT consult pager for further questions or concerns [phone])    The patient voiced complete understanding of plan as detailed above and is in full agreement.

## 2017-03-01 NOTE — Unmapped (Signed)
Problem: Patient Care Overview  Goal: Plan of Care Review  Outcome: Progressing  Pt. Arrived to unit shortly after shift change. 2 person  RN assessment performed by RN and Keitha Butte. No skin issues. No complaints of pain. Canuto button present but not in use. Blood glucose levels AC/HS. No coverage needed this shift. Taking thera flu as ordered.    Goal: Individualization and Mutuality  Outcome: Progressing   03/01/17 0543   Individualization   Patient Specific Preferences Pt. requested door be closed.    Patient Specific Goals (Include Timeframe) No S/S of flu on dishcarge.    Patient Specific Interventions assess for flu symptoms, VS as ordered, Labs as ordered, administer medication as ordered       Problem: Fall Risk (Adult)  Goal: Identify Related Risk Factors and Signs and Symptoms  Related risk factors and signs and symptoms are identified upon initiation of Human Response Clinical Practice Guideline (CPG).   Outcome: Progressing   03/01/17 0543   Fall Risk (Adult)   Related Risk Factors (Fall Risk) fatigue/slow reaction;gait/mobility problems;homeostatic imbalance   Signs and Symptoms (Fall Risk) presence of risk factors       Problem: Self-Care Deficit (Adult,Obstetrics,Pediatric)  Goal: Identify Related Risk Factors and Signs and Symptoms  Related risk factors and signs and symptoms are identified upon initiation of Human Response Clinical Practice Guideline (CPG).   Outcome: Progressing   03/01/17 0543   Self-Care Deficit (Adult,Obstetrics,Pediatric)   Related Risk Factors (Self-Care Deficit) activity intolerance;fatigue/weakness;disease process   Signs and Symptoms (Self-Care Deficit) decreased functional activity tolerance;dyspnea on exertion;gait unstable;unable to perform toileting/toilet hygiene tasks

## 2017-03-01 NOTE — Unmapped (Signed)
General Medicine History and Physical    Assessment/Plan:    Principal Problem:    Influenza A  Active Problems:    Chronic pain disorder    History of gastroesophageal reflux (GERD)    Paroxysmal atrial fibrillation (CMS-HCC)    S/P liver transplant (CMS-HCC)    Pulmonary hypertension, moderate to severe (CMS-HCC)    Peripheral neuropathy    Sepsis (CMS-HCC)      Jeffrey Ward is a 66 y.o. 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    Influenza A infection in immunocompromised host: Patient with onset of symptoms last Thursday with associated fever, weakness, and cough, as well as post-tussive emesis and decreased appetite. Tachycardic (borderline) and mildly tachypneic on initial presentation to the ED, febrile to 38.1. Non-toxic appearing. Admission labs notable for leukopenia with positive influenza A screen (influenza B negative). CXR unremarkable. Patient received vancomycin and cefepime for initial empiric treatment given sepsis concerns, transitioned to supportive care with influenza diagnosis.     - S/p vanc + cefepime in ED; do not suspect superimposed bacterial infection so will not continue antibiotics at present   - continue tamiflu  - f/u blood and urine cultures   - close monitoring of respiratory status->low threshold to repeat chest imaging    S/p OLT 09/2016: Secondary to chronic Hepatitis C and HCC. Postoperative course has been complicated by decompensation secondary to acute heart failure, acute kidney injury which required CRRT and subsequently resolved, severe pulmonary hypertension, bradycardia requiring a pacemaker, atrial fibrillation requiring anticoagulation with warfarin and amiodarone/digoxin, and dysphasia requiring a G-tube to be placed. Follows with Hepatology as outpatient. Had been following elevated LFTs (concern for hepatic biliary ductal dilatation on 01/2017 MRCP), since downtrending, and continuing to improve on admission labs.  - Hepatology consulted->appreciate recommendations and assistance   - continue mycophenolate (500mg  BID)  - Continue cyclosporine (200mg  AM, 175mg  PM)   - Send 12H cyclosporine trough   - bactrim DS MWF for PJP ppx  - Creatinine at patient baseline on presentation     S/p tracheostomy, incompletely healed: Patient with trach tube removed 11/2016 after hypoxic respiratory failure and failed extubation attempts in setting of liver transplant. Notes more bleeding and oozing around site over past weeks. Slow healing of tract secondary to immunosuppression above.  - ENT consult in AM to evaluate if any interventions necessary  - Last seen in clinic 11/19->considered possible revision of tc site at that time    Diabetes mellitus: Most recent HgA1c 5.1 10/15. Follows with PCP and transplant, though evaluated by Endocrinology (in context of hypothyroidism).  - lantus 12U nightly  - SSI  - hold home mealtime 6U TID AC  - On OTC capsaicin cream for diabetic neuropathy     HFpEF s/p implanted pacemaker: ECHO with preserved EF but diastolic dysfunction on post-transplant studies, with some improvement in RV function on 9/18 follow-up. Had associated bradycardia and PEA requiring pacemaker placement on 8/31 ([facility]). Interrogated on 10/26 with normal device function and no episodes appreciated and again on 11/29. Stable at present.   - Continue to monitor    Pulmonary hypertension: No longer taking sildenafil, stopped roughly 2-3 weeks ago by patient.    - Discuss further with transplant     Paroxysmal atrial fibrillation: In NSR on presentation, though subtherapeutic INR.   - continue digoxin  - increase warfarin to 3mg  daily, adjust  as needed (home 2mg  MWF, 3mg  TThSatSun); monitor closely as history of GI bleed in context of supratherapeutic INR  - daily PT/INR for monitoring     Malnutrition s/p G-tube: G-tube since removed with patient increasing oral intake.   - nutrition consult to assess current status     GERD:  - home omeprazole    Checklist:  FEN: general diet, tube feeds  DVT PPx: enoxaparin  GI PPx: home omeprazole  Access: PIV  Code status: Full code, confirmed with patient on admission  Surrogate decision maker: Wife   Dispo: Admit to MED U, floor status   __________________________________________________________________    Chief Complaint:  Chief Complaint   Patient presents with   ??? Fever Between 34 Weeks and 66 Years Old     Influenza A    HPI:  Jeffrey Ward is a 66 y.o. male with PMHx as reviewed in the EMR that presented to [facility] with Influenza A.    Reports onset of symptoms last Thursday preceded by feeling weak. York Spaniel he had a headache with onset of emesis on Friday that lasted for three days. Says the emesis may have been a little red-tinged but unsure.Did have an episode this morning (non-bilious, non-bloody). Has some mild associated abdominal pain, none over his transplant site. Emesis largely in the context of coughing.      Has associated cough with purulent sputum during this period. Fever at home this morning to 101.9 though subjective fevers started last Thursday. Has associated shortness of breath, but at patient baseline after transplant. Sinus congestion with rhinorrhea and sore throat.     Limited oral intake. Able to tolerate some fluids. Unclear if he was able to keep down his immunosuppressant medications, but thinks he was ok with most of them. Last time he did tube feeds was roughly a month ago. Denies any diarrhea, constipation, or abdominal pain otherwise. No dysuria. Endorses nocturia but at baseline. Has not appreciated any rashes, skin lesions, or swelling in his legs.     Has associated back pain, chronic issue. Intermittent chest pain, after he eats, sometimes with exertion. Says that his prior trach site he has noticed more blood and sticky discharge. Feels like the opening is getting bigger. He has not noticed an odor to the discharge. He denies any pain around site or any air that he can appreciate at rest but does so with coughing. Janina Mayo was removed in September he believes. He has not followed up with anyone about it.      Patient received the flu shot this year. He does not recall any sick contacts.     In the ED on presentation his vitals were notable for a fever to 38.1, HR 100, and RR 22, though with appropriate oxygen saturations. Labs were unremarkable with patient at baseline. Given 2.5L of fluid in ED and vancomycin and cefepime empirically for broad coverage with sepsis concerns. Started on tamiflu with positive influenza result.     Allergies:  Patient has no known allergies.    Medications:   Prior to Admission medications    Medication Dose, Route, Frequency   acetaminophen (TYLENOL) 325 MG tablet 325 mg, Oral, Every 4 hours PRN   blood sugar diagnostic (FREESTYLE TEST) Strp Other, 4 times a day (ACHS)   blood-glucose meter (GLUCOSE MONITORING KIT) kit Use as instructed   capsaicin (ZOSTRIX) 0.025 % cream Topical, 2 times a day (standard)   cholecalciferol, vitamin D3, 2,000 unit cap 2,000 Units, PEG Tube, Daily (standard)  digoxin (LANOXIN) 125 mcg tablet 62.5 mcg, G-tube, Daily (standard)   insulin ASPART (NOVOLOG FLEXPEN U-100 INSULIN) 100 unit/mL injection pen 6 Units, Subcutaneous, 3 times a day (AC)   insulin ASPART (NOVOLOG FLEXPEN) 100 unit/mL injection pen 2 Units, Subcutaneous, 3 times a day (AC), per SSI-  151-200= 2 units,  201-250= 4 units, 251-300=add 6 units, 301-350=8 units, 351-400=10 units, >400 =12 units   insulin glargine (LANTUS) 100 unit/mL (3 mL) injection pen 12 Units, Subcutaneous, Nightly   lancets Misc 1 each, Miscellaneous, 4 times a day (ACHS)   magnesium oxide-Mg AA chelate 133 mg Tab 2 tablets, Oral, 2 times a day, Picking up today   melatonin 3 mg Tab 3 mg, Oral, Nightly   mycophenolate (CELLCEPT) 250 mg capsule 500 mg, Oral, 2 times a day (standard) NEORAL 100 mg capsule 200 mg in am and 175 mg in pm (two 100 mg capsules in am and one 100 mg and three 25 mg capsules)   NEORAL 25 mg capsule 200 mg in am and 175 mg in pm (two 100 mg capsules in am and one 100 mg and three 25 mg capsules)   omeprazole (PRILOSEC) 20 MG capsule 20 mg, Oral, Daily (standard)   pen needle, diabetic (ULTICARE PEN NEEDLE) 32 gauge x 5/32 Ndle 1 each, Miscellaneous, 4 times a day (ACHS)   sildenafil, antihypertensive, (REVATIO) 20 mg tablet 40 mg, Oral, 3 times a day (standard)   sulfamethoxazole-trimethoprim (BACTRIM,SEPTRA) 400-80 mg per tablet 1 tablet, Oral, 3 times weekly   traMADol (ULTRAM) 50 mg tablet 50 mg, Oral, Every 4 to 6 hours PRN    warfarin (COUMADIN) 1 MG tablet 3 mg, Oral, Daily       Medical History:  Past Medical History:   Diagnosis Date   ??? Cancer (CMS-HCC)    ??? Cirrhosis (CMS-HCC)    ??? Diabetes (CMS-HCC)    ??? Hepatitis C 07/17/2012   ??? Liver disease    ??? Low back pain 07/17/2012   ??? Varices, esophageal (CMS-HCC)        Surgical History:  Past Surgical History:   Procedure Laterality Date   ??? ANKLE SURGERY     ??? BACK SURGERY     ??? BACK SURGERY     ??? CHG US GUIDE, TISSUE ABLATION N/A 01/22/2016    Procedure: ULTRASOUND GUIDANCE FOR, AND MONITORING OF, PARENCHYMAL TISSUE ABLATION;  Surgeon: Particia Nearing, MD;  Location: MAIN OR [facility];  Service: Transplant   ??? IR EMBOLIZATION ORGAN ISCHEMIA, TUMORS, INFAR  06/16/2016    IR EMBOLIZATION ORGAN ISCHEMIA, TUMORS, INFAR 06/16/2016 Ammie Dalton, MD IMG VIR [facility]   ??? PR COLON CA SCRN NOT HI RSK IND N/A 02/27/2015    Procedure: COLOREC CNCR SCR;COLNSCPY NO;  Surgeon: Vonda Antigua, MD;  Location: GI PROCEDURES MEMORIAL [facility];  Service: Gastroenterology   ??? PR ENDOSCOPIC ULTRASOUND EXAM N/A 02/27/2015    Procedure: UGI ENDO; W/ENDO ULTRASOUND EXAM INCLUDES ESOPHAGUS, STOMACH, &/OR DUODENUM/JEJUNUM;  Surgeon: Vonda Antigua, MD;  Location: GI PROCEDURES MEMORIAL [facility];  Service: Gastroenterology   ??? PR ERCP BALLOON DILATE BILIARY/PANC DUCT/AMPULLA EA N/A 02/10/2017    Procedure: ERCP;WITH TRANS-ENDOSCOPIC BALLOON DILATION OF BILIARY/PANCREATIC DUCT(S) OR OF AMPULLA, INCLUDING SPHINCTERECTOMY, WHEN PERFOREMD,EACH DUCT (40102);  Surgeon: Mayford Knife, MD;  Location: GI PROCEDURES MEMORIAL [facility];  Service: Gastroenterology   ??? PR ERCP REMOVE FOREIGN BODY/STENT BILIARY/PANC DUCT N/A 02/10/2017    Procedure: ENDOSCOPIC RETROGRADE CHOLANGIOPANCREATOGRAPHY (ERCP); W/ REMOVAL OF FOREIGN BODY/STENT FROM BILIARY/PANCREATIC DUCT(S);  Surgeon: Mayford Knife, MD;  Location: GI PROCEDURES MEMORIAL [facility];  Service: Gastroenterology   ??? PR ERCP STENT PLACEMENT BILIARY/PANCREATIC DUCT N/A 11/29/2016    Procedure: ENDOSCOPIC RETROGRADE CHOLANGIOPANCREATOGRAPHY (ERCP); WITH PLACEMENT OF ENDOSCOPIC STENT INTO BILIARY OR PANCREATIC DUCT;  Surgeon: Chriss Driver, MD;  Location: GI PROCEDURES MEMORIAL [facility];  Service: Gastroenterology   ??? PR ERCP,W/REMOVAL STONE,BIL/PANCR DUCTS N/A 11/29/2016    Procedure: ERCP; W/ENDOSCOPIC RETROGRADE REMOVAL OF CALCULUS/CALCULI FROM BILIARY &/OR PANCREATIC DUCTS;  Surgeon: Chriss Driver, MD;  Location: GI PROCEDURES MEMORIAL [facility];  Service: Gastroenterology   ??? PR ERCP,W/REMOVAL STONE,BIL/PANCR DUCTS N/A 02/10/2017    Procedure: ERCP; W/ENDOSCOPIC RETROGRADE REMOVAL OF CALCULUS/CALCULI FROM BILIARY &/OR PANCREATIC DUCTS;  Surgeon: Mayford Knife, MD;  Location: GI PROCEDURES MEMORIAL [facility];  Service: Gastroenterology   ??? PR INSER HEART TEMP PACER ONE CHMBR N/A 10/02/2016    Procedure: Tempoarary Pacemaker Insertion;  Surgeon: Meredith Leeds, MD;  Location: [facility];  Service: Cardiology   ??? PR LAP,DIAGNOSTIC ABDOMEN N/A 01/22/2016    Procedure: Laparoscopy, Abdomen, Peritoneum, & Omentum, Diagnostic, W/Wo Collection Specimen(S) By Brushing Or Washing;  Surgeon: Particia Nearing, MD;  Location: MAIN OR [facility];  Service: Transplant   ??? PR PLACE PERCUT GASTROSTOMY TUBE N/A 11/17/2016    Procedure: UGI ENDO; W/DIRECTED PLCMT PERQ GASTROSTOMY TUBE;  Surgeon: Cletis Athens, MD;  Location: GI PROCEDURES MEMORIAL [facility];  Service: Gastroenterology   ??? PR TRACHEOSTOMY, PLANNED N/A 09/29/2016    Procedure: TRACHEOSTOMY PLANNED (SEPART PROC);  Surgeon: Katherina Mires, MD;  Location: MAIN OR [facility];  Service: Trauma   ??? PR TRANSCATH INSERT OR REPLACE LEADLESS PM VENTR N/A 10/11/2016    Procedure: Pacemaker Implant/Replace Leadless;  Surgeon: Meredith Leeds, MD;  Location: [facility];  Service: Cardiology   ??? PR TRANSPLANT LIVER,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    Procedure: Liver Allotransplantation; Orthotopic, Partial Or Whole, From Cadaver Or Living Donor, Any Age;  Surgeon: Doyce Loose, MD;  Location: MAIN OR [facility];  Service: Transplant   ??? PR TRANSPLANT,PREP DONOR LIVER, WHOLE N/A 09/15/2016    Procedure: Rogelia Boga Std Prep Cad Donor Whole Liver Gft Prior Tnsplnt,Inc Chole,Diss/Rem Surr Tissu Wo Triseg/Lobe Splt;  Surgeon: Doyce Loose, MD;  Location: MAIN OR [facility];  Service: Transplant   ??? PR UPPER GI ENDOSCOPY,BIOPSY N/A 07/17/2012    Procedure: UGI ENDOSCOPY; WITH BIOPSY, SINGLE OR MULTIPLE;  Surgeon: Alba Destine, MD;  Location: GI PROCEDURES MEMORIAL [facility];  Service: Gastroenterology   ??? PR UPPER GI ENDOSCOPY,DIAGNOSIS N/A 02/04/2014    Procedure: UGI ENDO, INCLUDE ESOPHAGUS, STOMACH, & DUODENUM &/OR JEJUNUM; DX W/WO COLLECTION SPECIMN, BY BRUSH OR WASH;  Surgeon: Wilburt Finlay, MD;  Location: GI PROCEDURES MEMORIAL [facility];  Service: Gastroenterology   ??? PR UPPER GI ENDOSCOPY,LIGAT VARIX N/A 11/05/2013    Procedure: UGI ENDO; W/BAND LIG ESOPH &/OR GASTRIC VARICES;  Surgeon: Wilburt Finlay, MD;  Location: GI PROCEDURES MEMORIAL [facility];  Service: Gastroenterology       Social History:  Social History     Social History   ??? Marital status: Married     Spouse name: N/A   ??? Number of children: N/A   ??? Years of education: N/A     Occupational History   ??? Not on file.     Social History Main Topics   ??? Smoking status: Never Smoker   ??? Smokeless tobacco: Never Used      Comment: Smoked in high school for about 5 years.    ???  Alcohol use No   ??? Drug use: No   ??? Sexual activity: Yes     Partners: Female     Birth control/ protection: None     Other Topics Concern   ??? Not on file     Social History Narrative   ??? No narrative on file       Family History:  Family History   Problem Relation Age of Onset   ??? Hypertension Mother    ??? Cirrhosis Neg Hx    ??? Liver cancer Neg Hx    ??? Anemia Neg Hx    ??? Cancer Neg Hx    ??? Diabetes Neg Hx    ??? Kidney disease Neg Hx    ??? Obesity Neg Hx    ??? Thyroid disease Neg Hx    ??? Osteoporosis Neg Hx    ??? Coronary artery disease Neg Hx        Review of Systems:  10 systems reviewed and are negative unless otherwise mentioned in HPI    Labs/Studies:  Labs and Studies from the last 24hrs per EMR and Reviewed    Physical Exam:  Temp:  [37.2 ??C-38.1 ??C] 37.2 ??C  Heart Rate:  [78-100] 98  SpO2 Pulse:  [78-84] 78  Resp:  [18-22] 18  BP: (104-133)/(69-89) 128/69  SpO2:  [96 %-99 %] 99 %,   Intake/Output Summary (Last 24 hours) at 02/28/17 1628  Last data filed at 02/28/17 1344   Gross per 24 hour   Intake             2660 ml   Output                0 ml   Net             2660 ml   , Body mass index is 20.53 kg/m??.,   Wt Readings from Last 3 Encounters:   02/28/17 61.2 kg (135 lb)   02/10/17 63 kg (139 lb)   02/06/17 62 kg (136 lb 9.6 oz)       General: Patient resting comfortably in bed, in NAD   HEENT: PERRL, EOMI, oropharynx without ulceration. Mild pharyngeal erythema. No exudates. Patent prior tracheostomy site, with somewhat mucopurulent discharge. No erythema. Mild air leak when patient coughs.   CV: In NSR, regular rate, no m/r/g.   Pulm: CTAB, no wheezes or crackles. Comfortable on room air.   Abd: Soft, NTND. Capped site of prior G-tube placement. No evidence of erythema around site or tenderness on palpation.   Ext: Warm and well perfused. No edema. Good capillary refill.  Skin: No rashes or lesions noted.  Neuro: Cranial nerves II-XII intact. 5/5 muscle strength in all muscle groups tested.

## 2017-03-01 NOTE — Unmapped (Signed)
Care Management  Initial Transition Planning Assessment              General  Care Manager assessed the patient by : In person interview with patient  Orientation Level: Oriented X4  Who provides care at home?:  (self care)    Contact/Decision Maker:    Contact Details  Contact Details: Primary Contact  Primary Contact Name: Hakim Minniefield [phone]  Primary Contact Relationship: Spouse  Phone #1: [phone]  Secondary Contact Name: sister in law Lucendia Herrlich [phone]  Secondary Contact Relationship: Other (Comment)    Advance Directive (Medical Treatment)  Does patient have an advance directive covering medical treatment?: Patient does not have advance directive covering medical treatment., Patient would not like information.  Reason patient does not have an advance directive covering medical treatment:: Patient does not wish to complete one at this time  Surrogate decision maker appointed:: No  Reason there is not a surrogate decision maker appointed:: Patient needs follow-up to appoint a surrogate decision maker.  Information provided on advance directive:: No  Patient requests assistance:: No     Patient Information:    Lives with: Spouse/significant other    Type of Residence: Private residence    [address]  [place] [address]     Location/Detail: [phone]    Support Systems: Spouse, Family Members    Responsibilities/Dependents at home?: No    Home Care services in place prior to admission?: Yes  Type of Home Care services in place prior to admission: Home health (specify)  Current Home Care provider (Name/Phone #): [facility] 2x/week        Equipment Currently Used at Home: walker, rolling      Currently receiving outpatient dialysis?: No      Financial Information:        Need for financial assistance?: No      Discharge Needs Assessment:    Concerns to be Addressed: no discharge needs identified    Clinical Risk Factors: > 65         Medical Provider(s): BERT Maryjean Ka, MD  Reason for Admission: Admitting Diagnosis:   R/O Flu  Past Medical History:   has a past medical history of Cancer (CMS-HCC); Cirrhosis (CMS-HCC); Diabetes (CMS-HCC); Hepatitis C (07/17/2012); Liver disease; Low back pain (07/17/2012); and Varices, esophageal (CMS-HCC).  Past Surgical History:   has a past surgical history that includes Back surgery; pr upper gi endoscopy,biopsy (N/A, 07/17/2012); Back surgery; Ankle surgery; pr upper gi endoscopy,ligat varix (N/A, 11/05/2013); pr upper gi endoscopy,diagnosis (N/A, 02/04/2014); pr colon ca scrn not hi rsk ind (N/A, 02/27/2015); pr endoscopic ultrasound exam (N/A, 02/27/2015); chg US guide, tissue ablation (N/A, 01/22/2016); pr lap,diagnostic abdomen (N/A, 01/22/2016); IR Embolization Organ Ischemia, Tumors, Infar (06/16/2016); pr tracheostomy, planned (N/A, 09/29/2016); pr inser heart temp pacer one chmbr (N/A, 10/02/2016); pr transplant liver,allotransplant (N/A, 09/15/2016); pr transplant,prep donor liver, whole (N/A, 09/15/2016); pr transcath insert or replace leadless pm ventr (N/A, 10/11/2016); pr place percut gastrostomy tube (N/A, 11/17/2016); pr ercp stent placement biliary/pancreatic duct (N/A, 11/29/2016); pr ercp,w/removal stone,bil/pancr ducts (N/A, 11/29/2016); pr ercp balloon dilate biliary/panc duct/ampulla ea (N/A, 02/10/2017); pr ercp remove foreign body/stent biliary/panc duct (N/A, 02/10/2017); and pr ercp,w/removal stone,bil/pancr ducts (N/A, 02/10/2017).   Previous admit date: 01/11/2017    Primary Insurance- Payor: MEDICARE / Plan: MEDICARE PART A AND PART B / Product Type: *No Product type* /   Secondary Insurance ??? Secondary Conservator, museum/gallery    Preferred Pharmacy - [facility] -  [place], [place] - [address]  [facility] - [place], [place] - [address]   [facility] - [place], [place] - [address]    Transportation home: Private vehicle  Level of function prior to admission: Independent    Barriers to taking medications: No Prior overnight hospital stay or ED visit in last 90 days: Yes    Readmission Within the Last 30 Days: no previous admission in last 30 days     Anticipated Changes Related to Illness: none    Equipment Needed After Discharge: none    Discharge Facility/Level of Care Needs:  (home with [facility])    Patient at risk for readmission?: No    Discharge Plan:    Screen findings are: Care Manager reviewed the plan of the patient's care with the Multidisciplinary Team. No discharge planning needs identified at this time. Care Manager will continue to manage plan and monitor patient's progress with the team.    Expected Discharge Date: 03/02/17    Expected Transfer from Critical Care:      Patient and/or family were provided with choice of facilities / services that are available and appropriate to meet post hospital care needs?: Yes   List choices in order highest to lowest preferred, if applicable. : no preference    Initial Assessment complete?: Yes

## 2017-03-01 NOTE — Unmapped (Signed)
Problem: Patient Care Overview  Goal: Plan of Care Review  Outcome: Progressing  Patient admitted today from ED due to flu and weakness. Pt is AAOX4. Vitals stable. Will continue to monitor   02/28/17 1618   OTHER   Plan of Care Reviewed With patient;spouse   Plan of Care Review   Progress improving   .   Goal: Individualization and Mutuality  Outcome: Progressing    Goal: Discharge Needs Assessment  Outcome: Progressing    Goal: Interprofessional Rounds/Family Conf  Outcome: Progressing      Problem: Fall Risk (Adult)  Goal: Identify Related Risk Factors and Signs and Symptoms  Related risk factors and signs and symptoms are identified upon initiation of Human Response Clinical Practice Guideline (CPG).   Outcome: Progressing    Goal: Absence of Fall  Patient will demonstrate the desired outcomes by discharge/transition of care.   Outcome: Progressing   02/28/17 1618   Fall Risk (Adult)   Absence of Fall making progress toward outcome       Problem: Self-Care Deficit (Adult,Obstetrics,Pediatric)  Goal: Identify Related Risk Factors and Signs and Symptoms  Related risk factors and signs and symptoms are identified upon initiation of Human Response Clinical Practice Guideline (CPG).   Outcome: Progressing    Goal: Improved Ability to Perform BADL and IADL  Patient will demonstrate the desired outcomes by discharge/transition of care.   Outcome: Progressing   02/28/17 1618   Self-Care Deficit (Adult,Obstetrics,Pediatric)   Improved Ability to Perform BADL and IADL making progress toward outcome

## 2017-03-01 NOTE — Unmapped (Signed)
Warfarin Pharmacy Note    Jeffrey Ward is a 66 y.o. male being treated with warfarin for atrial fibrillation.    Anticoagulation History:    Target INR Range: 2-3  Prior to admission warfarin dose: 2 mg MWF and 3 mg TTSS    Lab Results   Component Value Date    INR 1.49 02/28/2017    INR 1.95 02/06/2017    INR 2.86 01/31/2017       Current Medications:  Potential drug, herb or dietary supplement interactions: trimethoprim/ sulfamethoxazole (on prophylactic dose)    Warfarin Assessment/Plan:  1. INR is subtherapeutic.  2. Recommend to increase to 3 mg daily.  3. Warfarin education will be completed by pharmacist prior to patient discharge.  4. A pharmacist will continue to monitor the INR daily and adjust the warfarin dose in conjunction with the medical team.    Meridee Score,  PharmD

## 2017-03-01 NOTE — Unmapped (Addendum)
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 with Influenza A.    Influenza A infection in immunocompromised host: Patient with onset of symptoms last Thursday with associated fever, weakness, and cough, as well as post-tussive emesis and decreased appetite. Tachycardic (borderline) and mildly tachypneic on initial presentation to the ED, febrile to 38.1. Non-toxic appearing. Admission labs notable for leukopenia with positive influenza A screen (influenza B negative). CXR unremarkable. Patient received vancomycin and cefepime for initial empiric treatment given sepsis concerns, transitioned to supportive care with influenza diagnosis.      S/p OLT 09/2016: Secondary to chronic Hepatitis C and HCC. Postoperative course has been complicated by decompensation secondary to acute heart failure, acute kidney injury which required CRRT and subsequently resolved, severe pulmonary hypertension, bradycardia requiring a pacemaker, atrial fibrillation requiring anticoagulation with warfarin and amiodarone/digoxin, and dysphasia requiring a G-tube to be placed. Follows with Hepatology as outpatient. Had been following elevated LFTs (concern for hepatic biliary ductal dilatation on 01/2017 MRCP), since downtrending, and continuing to improve on admission labs.    S/p tracheostomy, incompletely healed: Patient with trach tube removed 11/2016 after hypoxic respiratory failure and failed extubation attempts in setting of liver transplant. Notes more bleeding and oozing around site over past weeks. Slow healing of tract secondary to immunosuppression above.    HFpEF s/p implanted pacemaker: ECHO with preserved EF but diastolic dysfunction on post-transplant studies, with some improvement in RV function on 9/18 follow-up. Had associated bradycardia and PEA requiring pacemaker placement on 8/31 (MICRA). Interrogated on 10/26 with normal device function and no episodes appreciated and again on 11/29. Stable at present.     Transplant:   - consult transplant hepatology   Cyclosporine level (last 111, subtherapeutic) dose increased to 200AM, 175PM   - Continue PJP ppx with Bactrim     Afib with RVR: INR subtherapeutic on presentation (1.49).   - increase warfarin dose   - continue amiodarone and digoxin     Bradycardia: Required pacemaker (CIED) followed by Cardiology.     Malnutrition: G-tube placement     Pulmonary HTN: Sildenafil (40mg ) 3x daily.     T2DM:     PT/OT evaluation

## 2017-03-02 MED ORDER — MAGNESIUM OXIDE-MAGNESIUM AMINO ACID CHELATE 133 MG TABLET
ORAL_TABLET | Freq: Two times a day (BID) | ORAL | PRN refills | 0.00000 days
Start: 2017-03-02 — End: 2018-03-11

## 2017-03-02 MED ORDER — MG-PLUS-PROTEIN 133 MG TABLET
ORAL_TABLET | PRN refills | 0 days | Status: CP
Start: 2017-03-02 — End: 2017-06-22

## 2017-03-02 NOTE — Unmapped (Signed)
Late Entry from 03/02/17    CM spoke with MDU resident/Ringle. He reports that pt is doing well today and will d/c home. No needs. Resident asked about cyclosporin level that will need to be checked. CM will discuss with TNC.     TNC out today so talked to TNC/KimJ. She reports that cylcosporin is already included in lab order for post liver txp pts.

## 2017-03-02 NOTE — Unmapped (Signed)
Spoke with NP Daphine Deutscher about low CSA level during hospitalization and patient technically missed two doses due to vomiting no need for any adjustment at this time.  Patient wife agreed to pick up Tamiflu which is a $39 co-pay and waiting at local [facility] pharmacy. Also agreed to get repeat labs for patient tomorrow

## 2017-03-02 NOTE — Unmapped (Signed)
Patient request for RX refill.

## 2017-03-02 NOTE — Unmapped (Signed)
Physician Discharge Summary    Identifying Information:   Colleen Donahoe  [birth year]  [id number]    Admit date: 02/28/2017    Discharge date: 03/01/2017     Discharge Service: Med General Doristine Counter (MDU)    Discharge Attending Physician: Dr. 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  ??  Influenza A infection in immunocompromised host: Symptomatic complaints of fever, weakness, and cough, as well as post-tussive emesis and decreased appetite. Tachycardic (borderline) and mildly tachypneic on initial presentation to the ED, febrile to 38.1. Non-toxic appearing. Admission labs notable for leukopenia with positive influenza A screen (influenza B negative). CXR without concern for consolidation or acute cardiopulmonary process. Patient received vancomycin and cefepime for initial empiric treatment given sepsis concerns, transitioned to supportive care with influenza diagnosis. Started on Tamiflu with improvement in symptoms, continued as outpatient with plan for 5d course (30 BID, renal dosing).  ??  ??  S/p OLT 09/2016: Secondary to chronic Hepatitis C and HCC. Postoperative course has been complicated by decompensation secondary to acute heart failure, acute kidney injury which required CRRT and subsequently resolved, severe pulmonary hypertension, bradycardia requiring a pacemaker, atrial fibrillation requiring anticoagulation with warfarin and digoxin, and dysphasia requiring a G-tube to be placed. Follows with Hepatology as outpatient. Had been following elevated LFTs (concern for hepatic biliary ductal dilatation on 01/2017 MRCP), since downtrending, and improved on admission labs. Continued on Cellcept and cyclosporin. Cyclosporin level 67, but thought secondary to missed dose on day of presentation due to illness. Seen and evaluated by Hepatology who recommended continuation of current immunosuppressive regimen, to follow-up as outpatient. ??    S/p tracheostomy, with chronic tracheocutaneous fistula: Patient with trach tube removed 11/2016 after hypoxic respiratory failure and failed extubation attempts in setting of liver transplant. Had been evaluated by ENT 01/2017 with silver nitrate applied to his stoma, and plan for right vocal fold injection laryngoplasty. Seen inpatient by ENT who recommended patient follow-up as outpatient for likely TC fistula revision and vocal fold procedure to be scheduled on discharge.   ??  Paroxysmal Atrial Fibrillation: Continued home digoxin. INR noted to be subtherapeutic on presentation. Increased warfarin dose to 3mg  daily. Patient to follow-up for repeat labs to monitor INR and titrate warfarin. Will also need digoxin level for monitoring.     Pulmonary HTN: Previously on sildenafil with patient indicating he stopped taking in November. Should be discussed further as outpatient to determine need going forward.     HFpEF s/p implanted pacemaker: ECHO  with preserved EF but diastolic dysfunction on post-transplant studies, with some improvement in RV function on 9/18 follow-up. Had associated bradycardia and PEA requiring pacemaker placement on 8/31 (MICRA). Interrogated on 10/26 with normal device function and no episodes appreciated and again on 11/29. Stable on admission, no signs of decompensation.    IDDM: Well controlled. Continued home Lantus 12U nightly. Held mealtime insulin, continued at time of discharge.       Procedures:  None  No admission procedures for hospital encounter.  _____________________________________________________________________________  Discharge Day Services:  BP 130/61  - Pulse 76  - Temp 37.5 ??C (Oral)  - Resp 20  - Ht 172.7 cm (5' 8)  - Wt 65 kg (143 lb 4.8 oz)  - SpO2 97%  - BMI 21.79 kg/m??   Pt seen on the day of discharge and determined appropriate for discharge.    Condition at Discharge: good    Length of Discharge: I spent greater than 30 mins in the discharge of this patient.  _____________________________________________________________________________  Discharge Medications:     Your Medication List      STOP taking these medications    sildenafil (antihypertensive) 20 mg tablet  Commonly known as:  REVATIO        START taking these medications    oseltamivir 30 MG capsule  Commonly known as:  TAMIFLU  Take 1 capsule (30 mg total) by mouth Two (2) times a day. for 5 days        CHANGE how you take these medications    insulin ASPART 100 unit/mL injection pen  Commonly known as:  NovoLOG Flexpen U-100 Insulin  Inject 0.06 mL (6 Units total) under the skin Three (3) times a day before meals.  What changed:  Another medication with the same name was changed. Make sure you understand how and when to take each.     insulin ASPART 100 unit/mL injection pen  Commonly known as:  NovoLOG FLEXPEN  Additional mealtime per [facility]SI-  151-200= 2 units,  201-250= 4 units, 251-300=add 6 units, 301-350=8 units, 351-400=10 units, >400 =12 units  What changed:  ?? how much to take  ?? how to take this  ?? when to take this  ?? additional instructions     warfarin 1 MG tablet  Commonly known as:  COUMADIN  Take 3 tablets (3 mg total) by mouth daily with evening meal.  What changed:  ?? how much to take  ?? when to take this  ?? additional instructions        CONTINUE taking these medications    acetaminophen 325 MG tablet  Commonly known as:  TYLENOL  Take 1 tablet (325 mg total) by mouth every four (4) hours as needed for pain.     blood sugar diagnostic Strp  Commonly known as:  FREESTYLE TEST  by Other route Four (4) times a day (before meals and nightly).     blood-glucose meter kit  Commonly known as:  glucose monitoring kit  Use as instructed     capsaicin 0.025 % cream  Commonly known as:  ZOSTRIX  Apply topically Two (2) times a day.     cholecalciferol (vitamin D3) 2,000 unit Cap  1 capsule (2,000 Units total) by PEG Tube route daily.     digoxin 125 mcg tablet  Commonly known as:  LANOXIN  0.5 tablets (62.5 mcg total) by G-tube route daily.     insulin glargine 100 unit/mL (3 mL) injection pen  Commonly known  as:  LANTUS  Inject 0.12 mL (12 Units total) under the skin nightly.     lancets Misc  1 each by Miscellaneous route Four (4) times a day (before meals and nightly).     magnesium oxide-Mg AA chelate 133 mg Tab  Take 2 tablets by mouth Two (2) times a day. Picking up today     melatonin 3 mg Tab  Take 1 tablet (3 mg total) by mouth nightly.     mycophenolate 250 mg capsule  Commonly known as:  CELLCEPT  Take 2 capsules (500 mg total) by mouth Two (2) times a day.     NEORAL 100 MG capsule  Generic drug:  cycloSPORINE modified  200 mg in am and 175 mg in pm (two 100 mg capsules in am and one 100 mg and three 25 mg capsules)     NEORAL 25 MG capsule  Generic drug:  cycloSPORINE modified  200 mg in am and 175 mg in pm (two 100 mg capsules in am and one 100 mg and three 25 mg capsules)     omeprazole 20 MG capsule  Commonly known as:  PriLOSEC  Take 1 capsule (20 mg total) by mouth daily.     pen needle, diabetic 32 gauge x 5/32 Ndle Commonly known as:  ULTICARE PEN NEEDLE  1 each by Miscellaneous route Four (4) times a day (before meals and nightly).     sulfamethoxazole-trimethoprim 400-80 mg per tablet  Commonly known as:  BACTRIM,SEPTRA  Take 1 tablet (80 mg of trimethoprim total) by mouth 3 (three) times a week.     traMADol 50 mg tablet  Commonly known as:  ULTRAM  Take 50 mg by mouth. Every 4 to 6 hours PRN          _____________________________________________________________________________  Pending Test Results (if blank, then none):   Order Current Status    Blood Culture Preliminary result    Blood Culture Preliminary result          Most Recent Labs:  Microbiology Results (last day)     Procedure Component Value Date/Time Date/Time    Blood Culture [[id number]]  (Normal) Collected:  02/28/17 1223    Lab Status:  Preliminary result Specimen:  Blood from Peripheral Updated:  03/01/17 1430     Blood Culture, Routine No Growth at 24 hours    Blood Culture [[id number]]  (Normal) Collected:  02/28/17 1223    Lab Status:  Preliminary result Specimen:  Blood from Peripheral Updated:  03/01/17 1430     Blood Culture, Routine No Growth at 24 hours    Urine Culture [[id number]]  (Abnormal) Collected:  02/28/17 1348    Lab Status:  Final result Specimen:  Urine from Urine Updated:  03/01/17 1032     Urine Culture, Comprehensive 10,000 to 50,000 CFU/mL Enterococcus faecalis (A)     Comment: Susceptibility Testing By Consultation Only       Narrative:       Specimen Source: Urine          Lab Results   Component Value Date    WBC 3.3 (L) 03/01/2017    HGB 9.3 (L) 03/01/2017    HCT 28.0 (L) 03/01/2017    PLT 82 (L) 03/01/2017       Lab Results   Component Value Date    NA 135 03/01/2017    K 3.9 03/01/2017    CL 105 03/01/2017    CO2 22.0 03/01/2017    BUN 18 03/01/2017  CREATININE 1.26 03/01/2017    CALCIUM 8.8 03/01/2017    MG 1.8 02/24/2017    PHOS 3.1 02/24/2017       Lab Results   Component Value Date    ALKPHOS 143 (H) 03/01/2017 BILITOT 0.8 03/01/2017    BILIDIR <0.10 02/24/2017    PROT 5.1 (L) 03/01/2017    ALBUMIN 3.0 (L) 03/01/2017    ALT 27 03/01/2017    AST 17 (L) 03/01/2017    GGT 296 (H) 02/24/2017       Lab Results   Component Value Date    PT 16.1 (H) 03/01/2017    INR 1.41 03/01/2017    APTT 35.2 02/28/2017     Hospital Radiology:  Xr Chest 2 Views    Result Date: 02/28/2017  EXAM: XR CHEST 2 VIEWS DATE: 02/28/2017 12:51 PM ACCESSION: [id number] UN DICTATED: 02/28/2017 1:18 PM INTERPRETATION LOCATION: Main Campus CLINICAL INDICATION: 66 years old Male with FEVER--  COMPARISON: 11/20/2016 TECHNIQUE: PA and Lateral Chest Radiographs. FINDINGS: Radiographically clear lungs. No pleural effusion or pneumothorax. Unremarkable cardiomediastinal silhouette. Compression deformities in lower thoracic spine.      Clear lungs.       _____________________________________________________________________________  Discharge Instructions:   Activity Instructions     Activity as tolerated                     Follow Up instructions and Outpatient Referrals     Call MD for:  difficulty breathing, headache or visual disturbances       Call MD for:  temperature >38.5 Celsius       Discharge instructions       You were seen and admitted to the hospital for the flu. You were given tamiflu and symptoms improved. You will continue to take this medicine at home for the next few days. Please complete the full course.     You were seen by the transplant doctors who did not feel any changes needed to be made to your medications at this time.     Your warfarin dose was adjusted while in the hospital as your levels were not where they should be. Your dose has been increased to 3mg  daily. It will be very important for you to follow-up with your doctor on Friday to check on your INR and follow things going forward. You should also have a digoxin level obtained at that time, and have your cyclosporine level followed up.      You will be contacted by the throat doctors to return for your procedure. Please call [phone] if you do not hear from them in the coming week.     Please contact us if you have any questions or concerns. It was a pleasure to be able to participate in your care.     Contact your healthcare provider if:  Your symptoms get worse.  You have new symptoms, such as muscle pain or weakness.  You have questions or concerns about your condition or care.    Return to the emergency department if:  You have a headache with a stiff neck, and you feel very tired or confused.  You have trouble breathing, and your lips look purple or blue.  You have new pain or pressure in your chest.  You have worsening cough.               Appointments which have been scheduled for you    Mar 28, 2017 12:30 PM EST  (Arrive by 12:15  PM)  NEW [place] with Oran Rein, MD  [facility] NEUROLOGY CLINIC [place] [address] [place] [address] [place] [facility]) [address]  [place] [address]  [phone]   Apr 10, 2017 11:40 AM EST  (Arrive by 11:25 AM)  RETURN EP with Meredith Leeds, MD  [facility] HEART VASCULAR CTR CARDIO [place] [facility] REGION) [address]  [place] [address]  [phone]   Apr 11, 2017 12:40 PM EST  (Arrive by 12:25 PM)  RETURN  GENERAL with Idelle Crouch, MD  [facility] PHYSICAL MEDICINE [facility] [address] [place] [facility]) [address]  [place] [address]  [phone]   Apr 17, 2017  1:10 PM EST  (Arrive by 12:55 PM)  RETURN  ENDOCRINE with Jimmie Molly, MD  [facility] DIABETES AND ENDOCRINOLOGY [place] [facility] REGION) [address]  [place] [address]  [phone]   Apr 21, 2017  2:15 PM EST  (Arrive by 2:00 PM)  ECHOCARDIOGRAM W COLORFLOW SPECTRAL DOPPLER with MM ECHO RM 3  IMG ECHO [place] [facility]) [address]  [place] [address] [phone]   Apr 21, 2017  3:00 PM EST  (Arrive by 2:45 PM)  RETURN  PULM HYPERTENSION with Jettie Booze, MD  [facility] PULMONARY SPECIALTY CL [place] [facility] REGION) [address]  [place] [address]  [phone]   Apr 24, 2017  1:00 PM EST  (Arrive by 12:30 PM)  AT HOME PACEMAKER CHECK with [facility] EP REMOTE MONITORING  [facility] EP REMOTE MONITORING [place] [facility] REGION) [address]  [address] [place] [address]  [phone]   Apr 26, 2017  ERCP; DIAGNOSTIC, WITH OR WITHOUT COLLECTION OF SPECIMEN(S) BY BRUSHING OR WASHING with Mayford Knife, MD  GI PERIOP [facility] REGION) [address]  [place] [address]  [phone]   May 31, 2017  1:00 PM EDT  (Arrive by 12:45 PM)  INJECTION with Vista Deck, MD  [facility] MPR HEAD NECK [place] [facility] REGION) [address]  [place] [address]  [phone]   Jul 24, 2017  1:00 PM EDT  (Arrive by 12:30 PM)  AT HOME PACEMAKER CHECK with [facility] EP REMOTE MONITORING  [facility] EP REMOTE MONITORING [place] [facility] REGION) [address]  [address] [place] [address]  [phone]

## 2017-03-02 NOTE — Unmapped (Signed)
Pharmacist Discharge Note  Patient Name: Jeffrey Ward  Reason for admission: Positive influenza A  Reason for writing this note: patient requires medication-related outpatient intervention and/or monitoring    Highlighted medication changes :  - Started Oseltamavir 30mg  po bid x 5 days for influenza  - Changed goal for cyclosporine to 150-200 for troughs.    Continue dose of cyclosporine 200mg   Am, 175 pm;     (low levels during inpatient stay due to missed doses at home)    Continue Cellcept at 500mg  bid.    Increased warfarin dosing to 3mg  qd for paroxymal afib due to     subtherapeutic INR on this admission.  - Stopped Sildenafil due to patient not taking for about 3 weeks.    Medication access/Adherence:  - No barriers identified    Outpatient follow-up:  [ ]  INR, Dig level, cyclosporin trough, Cr and electrolytes, and CBC within 2 week(s);  Re-assessment of influenza symptoms in case of no improvement for need to extend treatment of oseltamavir.    Andres Shad Fielding Mault   Clinical Pharmacist    Future Appointments  Date Time Provider Department Center   03/28/2017 12:30 PM Oran Rein, MD NEUR [place]   04/10/2017 11:40 AM Meredith Leeds, MD [facility]   04/11/2017 12:40 PM Idelle Crouch, MD PMRFORD [place]   04/17/2017 1:10 PM Jimmie Molly, MD UNCENDOMMNT [place]   04/21/2017 2:15 PM MM ECHO RM 3 IECHOMMNT [place] MM   04/21/2017 3:00 PM Jettie Booze, MD UNCPULMMMNT [place]   04/24/2017 1:00 PM [facility] EP REMOTE MONITORING EPMONITORCH [place]   05/31/2017 1:00 PM Rupali Harvie Bridge, MD [facility] PRC [facility]   07/24/2017 1:00 PM [place] EP REMOTE MONITORING EPMONITORCH [facility] [facility]

## 2017-03-03 ENCOUNTER — Ambulatory Visit
Admission: RE | Admit: 2017-03-03 | Discharge: 2017-03-03 | Disposition: A | Discharge status: Home / Self Care | Payer: MEDICARE

## 2017-03-03 DIAGNOSIS — K769 Liver disease, unspecified: Secondary | ICD-10-CM

## 2017-03-03 DIAGNOSIS — Z79899 Other long term (current) drug therapy: Secondary | ICD-10-CM

## 2017-03-03 DIAGNOSIS — Z944 Liver transplant status: Principal | ICD-10-CM

## 2017-03-03 LAB — MAGNESIUM: Magnesium:MCnc:Pt:Ser/Plas:Qn:: 1.7

## 2017-03-03 LAB — PHOSPHORUS: Phosphate:MCnc:Pt:Ser/Plas:Qn:: 3

## 2017-03-03 LAB — CBC W/ AUTO DIFF
HEMATOCRIT: 30.4 % — ABNORMAL LOW (ref 41.0–53.0)
HEMOGLOBIN: 10.5 g/dL — ABNORMAL LOW (ref 13.5–17.5)
LARGE UNSTAINED CELLS: 3 % (ref 0–4)
LYMPHOCYTES ABSOLUTE COUNT: 1 10*9/L — ABNORMAL LOW (ref 1.5–5.0)
MEAN CORPUSCULAR HEMOGLOBIN CONC: 34.6 g/dL (ref 31.0–37.0)
MEAN CORPUSCULAR HEMOGLOBIN: 31.6 pg (ref 26.0–34.0)
MEAN CORPUSCULAR VOLUME: 91.3 fL (ref 80.0–100.0)
MEAN PLATELET VOLUME: 8.3 fL (ref 7.0–10.0)
MONOCYTES ABSOLUTE COUNT: 0.1 10*9/L — ABNORMAL LOW (ref 0.2–0.8)
NEUTROPHILS ABSOLUTE COUNT: 1.2 10*9/L — ABNORMAL LOW (ref 2.0–7.5)
PLATELET COUNT: 99 10*9/L — ABNORMAL LOW (ref 150–440)
RED BLOOD CELL COUNT: 3.33 10*12/L — ABNORMAL LOW (ref 4.50–5.90)
RED CELL DISTRIBUTION WIDTH: 16.2 % — ABNORMAL HIGH (ref 12.0–15.0)
WBC ADJUSTED: 2.5 10*9/L — ABNORMAL LOW (ref 4.5–11.0)

## 2017-03-03 LAB — COMPREHENSIVE METABOLIC PANEL
ALBUMIN: 3.5 g/dL (ref 3.5–5.0)
ALKALINE PHOSPHATASE: 164 U/L — ABNORMAL HIGH (ref 38–126)
ALT (SGPT): 17 U/L — ABNORMAL LOW (ref 19–72)
AST (SGOT): 17 U/L — ABNORMAL LOW (ref 19–55)
BILIRUBIN TOTAL: 0.5 mg/dL (ref 0.0–1.2)
BLOOD UREA NITROGEN: 23 mg/dL — ABNORMAL HIGH (ref 7–21)
CALCIUM: 9.3 mg/dL (ref 8.5–10.2)
CHLORIDE: 108 mmol/L — ABNORMAL HIGH (ref 98–107)
CO2: 23 mmol/L (ref 22.0–30.0)
CREATININE: 1.43 mg/dL — ABNORMAL HIGH (ref 0.70–1.30)
EGFR MDRD AF AMER: >=60 mL/min/{1.73_m2} (ref >=60)
EGFR MDRD NON AF AMER: 49 mL/min/{1.73_m2} — ABNORMAL LOW (ref >=60)
GLUCOSE RANDOM: 149 mg/dL (ref 65–179)
PROTEIN TOTAL: 6 g/dL — ABNORMAL LOW (ref 6.5–8.3)
SODIUM: 138 mmol/L (ref 135–145)

## 2017-03-03 LAB — GAMMA GLUTAMYL TRANSFERASE: Gamma glutamyl transferase:CCnc:Pt:Ser/Plas:Qn:: 184 — ABNORMAL HIGH

## 2017-03-03 LAB — MEAN CORPUSCULAR VOLUME: Lab: 91.3

## 2017-03-03 LAB — EGFR MDRD NON AF AMER
Glomerular filtration rate/1.73 sq M.predicted.non black:ArVRat:Pt:Ser/Plas/Bld:Qn:Creatinine-based formula (MDRD): 49 — ABNORMAL LOW

## 2017-03-03 LAB — BILIRUBIN DIRECT: Bilirubin.glucuronidated:MCnc:Pt:Ser/Plas:Qn:: <0.1

## 2017-03-03 MED ORDER — OSELTAMIVIR 30 MG CAPSULE: capsule | 0 refills | 0 days

## 2017-03-03 MED ORDER — OSELTAMIVIR 30 MG CAPSULE
ORAL_CAPSULE | Freq: Two times a day (BID) | ORAL | 0 refills | 0.00000 days | Status: CP
Start: 2017-03-03 — End: 2017-06-22

## 2017-03-03 MED FILL — OSELTAMIVIR PHOSPHATE/30MG/CAPS: OSELTAMIVIR PHOSPHATE/30MG/CAPS | 5 days supply | Qty: 10 | Fill #0

## 2017-03-03 MED FILL — MAGPROTEIN TABS/133MG/TAB: MAGPROTEIN TABS/133MG/TAB | 50 days supply | Qty: 100 | Fill #0

## 2017-03-03 NOTE — Unmapped (Signed)
Per D/c summary did add on for digoxin and placed order for PT/INR.  Made PA Shurney aware of Enterococcus faecalis urine result and  Told to repeat U/A with culture.  Wife verbalized understanding.

## 2017-03-03 NOTE — Unmapped (Signed)
Confirmed with patient and wife labs look good today.

## 2017-03-03 NOTE — Unmapped (Signed)
Confirmed [facility] pharmacy has tamiflu in stock unlike local [facility] which is still waiting for it to be ordered.  Called in verbal script for tamiflu 30 mg BID qty 10 refills 0.  Confirmed wiyth wife she would pick it up today and patient getting STAT labs drawn now in order for results to be back prior to Holliday weekend.

## 2017-03-04 LAB — CYCLOSPORINE, TROUGH: Lab: 192

## 2017-03-08 ENCOUNTER — Ambulatory Visit
Admission: RE | Admit: 2017-03-08 | Discharge: 2017-03-08 | Disposition: A | Discharge status: Home / Self Care | Payer: MEDICARE

## 2017-03-08 DIAGNOSIS — Z79899 Other long term (current) drug therapy: Secondary | ICD-10-CM

## 2017-03-08 DIAGNOSIS — K769 Liver disease, unspecified: Secondary | ICD-10-CM

## 2017-03-08 DIAGNOSIS — Z9229 Personal history of other drug therapy: Secondary | ICD-10-CM

## 2017-03-08 DIAGNOSIS — B952 Enterococcus as the cause of diseases classified elsewhere: Secondary | ICD-10-CM

## 2017-03-08 DIAGNOSIS — Z944 Liver transplant status: Principal | ICD-10-CM

## 2017-03-08 DIAGNOSIS — Z5181 Encounter for therapeutic drug level monitoring: Secondary | ICD-10-CM

## 2017-03-08 LAB — PHOSPHORUS: Phosphate:MCnc:Pt:Ser/Plas:Qn:: 3

## 2017-03-08 LAB — URINALYSIS WITH CULTURE REFLEX
BACTERIA: NONE SEEN /HPF
BLOOD UA: NEGATIVE
GLUCOSE UA: NEGATIVE
HYALINE CASTS: 10 /LPF — ABNORMAL HIGH (ref <=0)
LEUKOCYTE ESTERASE UA: NEGATIVE
NITRITE UA: NEGATIVE
RBC UA: 1 /HPF (ref <3)
SPECIFIC GRAVITY UA: 1.025 (ref 1.005–1.040)
SQUAMOUS EPITHELIAL: 1 /HPF (ref 0–5)
WBC UA: <1 /HPF (ref <2)

## 2017-03-08 LAB — COMPREHENSIVE METABOLIC PANEL
ALBUMIN: 3.8 g/dL (ref 3.5–5.0)
ALKALINE PHOSPHATASE: 160 U/L — ABNORMAL HIGH (ref 38–126)
ALT (SGPT): 17 U/L — ABNORMAL LOW (ref 19–72)
ANION GAP: 6 mmol/L — ABNORMAL LOW (ref 9–15)
AST (SGOT): 13 U/L — ABNORMAL LOW (ref 19–55)
BILIRUBIN TOTAL: 0.8 mg/dL (ref 0.0–1.2)
BLOOD UREA NITROGEN: 26 mg/dL — ABNORMAL HIGH (ref 7–21)
BUN / CREAT RATIO: 20
CALCIUM: 9.6 mg/dL (ref 8.5–10.2)
CO2: 26 mmol/L (ref 22.0–30.0)
CREATININE: 1.31 mg/dL — ABNORMAL HIGH (ref 0.70–1.30)
EGFR MDRD AF AMER: >=60 mL/min/{1.73_m2} (ref >=60)
EGFR MDRD NON AF AMER: 54 mL/min/{1.73_m2} — ABNORMAL LOW (ref >=60)
POTASSIUM: 4 mmol/L (ref 3.5–5.0)
PROTEIN TOTAL: 6.5 g/dL (ref 6.5–8.3)
SODIUM: 140 mmol/L (ref 135–145)

## 2017-03-08 LAB — CBC W/ AUTO DIFF
EOSINOPHILS ABSOLUTE COUNT: 0 10*9/L (ref 0.0–0.4)
HEMATOCRIT: 32.8 % — ABNORMAL LOW (ref 41.0–53.0)
HEMOGLOBIN: 11 g/dL — ABNORMAL LOW (ref 13.5–17.5)
LARGE UNSTAINED CELLS: 3 % (ref 0–4)
LYMPHOCYTES ABSOLUTE COUNT: 1 10*9/L — ABNORMAL LOW (ref 1.5–5.0)
MEAN CORPUSCULAR HEMOGLOBIN CONC: 33.6 g/dL (ref 31.0–37.0)
MEAN CORPUSCULAR HEMOGLOBIN: 31.1 pg (ref 26.0–34.0)
MEAN PLATELET VOLUME: 8.5 fL (ref 7.0–10.0)
MONOCYTES ABSOLUTE COUNT: 0.2 10*9/L (ref 0.2–0.8)
NEUTROPHILS ABSOLUTE COUNT: 1.8 10*9/L — ABNORMAL LOW (ref 2.0–7.5)
PLATELET COUNT: 144 10*9/L — ABNORMAL LOW (ref 150–440)
RED BLOOD CELL COUNT: 3.56 10*12/L — ABNORMAL LOW (ref 4.50–5.90)
RED CELL DISTRIBUTION WIDTH: 16.5 % — ABNORMAL HIGH (ref 12.0–15.0)
WBC ADJUSTED: 3.2 10*9/L — ABNORMAL LOW (ref 4.5–11.0)

## 2017-03-08 LAB — GAMMA GLUTAMYL TRANSFERASE: Gamma glutamyl transferase:CCnc:Pt:Ser/Plas:Qn:: 154 — ABNORMAL HIGH

## 2017-03-08 LAB — RBC UA: Lab: 1

## 2017-03-08 LAB — MAGNESIUM: Magnesium:MCnc:Pt:Ser/Plas:Qn:: 1.6

## 2017-03-08 LAB — INR: Lab: 2.58

## 2017-03-08 LAB — BILIRUBIN DIRECT: Bilirubin.glucuronidated:MCnc:Pt:Ser/Plas:Qn:: 0.3

## 2017-03-08 LAB — NEUTROPHILS ABSOLUTE COUNT: Lab: 1.8 — ABNORMAL LOW

## 2017-03-08 LAB — DIGOXIN LEVEL: Digoxin:MCnc:Pt:Ser/Plas:Qn:: 0.4 — ABNORMAL LOW

## 2017-03-08 LAB — ALBUMIN: Albumin:MCnc:Pt:Ser/Plas:Qn:: 3.8

## 2017-03-09 LAB — CYCLOSPORINE, TROUGH: Lab: 148

## 2017-03-10 NOTE — Unmapped (Signed)
Patient request for RX refill.

## 2017-03-10 NOTE — Unmapped (Addendum)
[facility] Refill and Clinical Coordination Note  Medication(s): NEORAL 100 AND 25, MYCOPHENOLATE 250MG     Jeffrey Ward, DOB: 1950-05-25  Phone: [phone] (home) , Alternate phone contact: N/A  Shipping address: [address] [place] [name] [place], [place] [address]  Phone or address changes today?: No  All above HIPAA information verified.  Insurance changes? No    Completed refill and clinical call assessment today to schedule patient's medication shipment from the [facility] [phone]).      MEDICATION RECONCILIATION    Confirmed the medication and dosage are correct and have not changed: Yes, regimen is correct and unchanged.    Were there any changes to your medication(s) in the past month:  No, there are no changes reported at this time.    ADHERENCE    Is this medicine transplant or covered by Medicare Part B? Yes.    Neoral 100 mg   Quantity filled last month: 120   # of tablets left on hand: 48  Neoral 25 mg   Quantity filled last month: 180   # of tablets left on hand: 65  Mycophenolate Mofetil 250 mg   Quantity filled last month: 120   # of tablets left on hand: 47      Did you miss any doses in the past 4 weeks? No missed doses reported.  Adherence counseling provided? Not needed     SIDE EFFECT MANAGEMENT    Are you tolerating your medication?:  Jeffrey Ward reports tolerating the medication.  Side effect management discussed: None      Therapy is appropriate and should be continued.    Evidence of clinical benefit: See Epic note from 01/23/17      FINANCIAL/SHIPPING    Delivery Scheduled: Yes, Expected medication delivery date: 03/17/17   Additional medications refilled: BACTRIM (needs refills, contacting coordinator today.    Jeffrey Ward did not have any additional questions at this time.    Delivery address validated in [facility] scheduling system: Yes, address listed above is correct.      We will follow up with patient monthly for standard refill processing and delivery.      Thank you, Thad Ranger   [facility] Specialty Pharmacist

## 2017-03-13 ENCOUNTER — Ambulatory Visit
Admission: RE | Admit: 2017-03-13 | Discharge: 2017-03-13 | Disposition: A | Discharge status: Home / Self Care | Payer: MEDICARE

## 2017-03-13 DIAGNOSIS — Z79899 Other long term (current) drug therapy: Secondary | ICD-10-CM

## 2017-03-13 DIAGNOSIS — Z944 Liver transplant status: Principal | ICD-10-CM

## 2017-03-13 DIAGNOSIS — K769 Liver disease, unspecified: Secondary | ICD-10-CM

## 2017-03-13 LAB — COMPREHENSIVE METABOLIC PANEL
ALBUMIN: 3.5 g/dL (ref 3.5–5.0)
ALKALINE PHOSPHATASE: 151 U/L — ABNORMAL HIGH (ref 38–126)
ALT (SGPT): 11 U/L — ABNORMAL LOW (ref 19–72)
ANION GAP: 8 mmol/L — ABNORMAL LOW (ref 9–15)
AST (SGOT): 14 U/L — ABNORMAL LOW (ref 19–55)
BILIRUBIN TOTAL: 0.7 mg/dL (ref 0.0–1.2)
BLOOD UREA NITROGEN: 28 mg/dL — ABNORMAL HIGH (ref 7–21)
BUN / CREAT RATIO: 23
CALCIUM: 10 mg/dL (ref 8.5–10.2)
CHLORIDE: 109 mmol/L — ABNORMAL HIGH (ref 98–107)
CO2: 23 mmol/L (ref 22.0–30.0)
CREATININE: 1.21 mg/dL (ref 0.70–1.30)
EGFR MDRD AF AMER: >=60 mL/min/{1.73_m2} (ref >=60)
EGFR MDRD NON AF AMER: >=60 mL/min/{1.73_m2} (ref >=60)
GLUCOSE RANDOM: 142 mg/dL (ref 65–179)
POTASSIUM: 4.5 mmol/L (ref 3.5–5.0)
PROTEIN TOTAL: 6.2 g/dL — ABNORMAL LOW (ref 6.5–8.3)

## 2017-03-13 LAB — CBC W/ AUTO DIFF
BASOPHILS ABSOLUTE COUNT: 0 10*9/L (ref 0.0–0.1)
EOSINOPHILS ABSOLUTE COUNT: 0 10*9/L (ref 0.0–0.4)
HEMATOCRIT: 31.4 % — ABNORMAL LOW (ref 41.0–53.0)
HEMOGLOBIN: 10.9 g/dL — ABNORMAL LOW (ref 13.5–17.5)
LARGE UNSTAINED CELLS: 3 % (ref 0–4)
LYMPHOCYTES ABSOLUTE COUNT: 1 10*9/L — ABNORMAL LOW (ref 1.5–5.0)
MEAN CORPUSCULAR HEMOGLOBIN CONC: 34.6 g/dL (ref 31.0–37.0)
MEAN CORPUSCULAR HEMOGLOBIN: 31.7 pg (ref 26.0–34.0)
MEAN PLATELET VOLUME: 8.9 fL (ref 7.0–10.0)
MONOCYTES ABSOLUTE COUNT: 0.3 10*9/L (ref 0.2–0.8)
NEUTROPHILS ABSOLUTE COUNT: 2.1 10*9/L (ref 2.0–7.5)
PLATELET COUNT: 127 10*9/L — ABNORMAL LOW (ref 150–440)
RED BLOOD CELL COUNT: 3.42 10*12/L — ABNORMAL LOW (ref 4.50–5.90)
WBC ADJUSTED: 3.6 10*9/L — ABNORMAL LOW (ref 4.5–11.0)

## 2017-03-13 LAB — MEAN CORPUSCULAR HEMOGLOBIN CONC: Lab: 34.6

## 2017-03-13 LAB — MAGNESIUM: Magnesium:MCnc:Pt:Ser/Plas:Qn:: 1.7

## 2017-03-13 LAB — CHLORIDE: Chloride:SCnc:Pt:Ser/Plas:Qn:: 109 — ABNORMAL HIGH

## 2017-03-13 LAB — PHOSPHORUS: Phosphate:MCnc:Pt:Ser/Plas:Qn:: 3.5

## 2017-03-13 LAB — GAMMA GLUTAMYL TRANSFERASE: Gamma glutamyl transferase:CCnc:Pt:Ser/Plas:Qn:: 125 — ABNORMAL HIGH

## 2017-03-13 LAB — BILIRUBIN DIRECT: Bilirubin.glucuronidated:MCnc:Pt:Ser/Plas:Qn:: 0.4

## 2017-03-14 LAB — CYCLOSPORINE, TROUGH: Lab: 144

## 2017-03-16 MED ORDER — MYCOPHENOLATE MOFETIL 250 MG CAPSULE: capsule | 6 refills | 0 days

## 2017-03-16 MED ORDER — MYCOPHENOLATE MOFETIL 250 MG CAPSULE
ORAL_CAPSULE | Freq: Two times a day (BID) | ORAL | 3 refills | 0.00000 days | Status: CP
Start: 2017-03-16 — End: 2017-03-16

## 2017-03-16 MED ORDER — MYCOPHENOLATE MOFETIL 250 MG CAPSULE: capsule | 7 refills | 0 days | Status: AC

## 2017-03-16 MED FILL — NEORAL/25MG/CAP: NEORAL/25MG/CAP | 30 days supply | Qty: 90 | Fill #0

## 2017-03-16 MED FILL — MYCOPHENOLATE MOFETIL/250MG/CAPS: MYCOPHENOLATE MOFETIL/250MG/CAPS | 30 days supply | Qty: 120 | Fill #0

## 2017-03-16 MED FILL — NEORAL/100MG/CAP: NEORAL/100MG/CAP | 30 days supply | Qty: 90 | Fill #0

## 2017-03-17 ENCOUNTER — Ambulatory Visit: Admit: 2017-03-17 | Discharge: 2017-03-18 | Payer: MEDICARE

## 2017-03-17 DIAGNOSIS — Z5181 Encounter for therapeutic drug level monitoring: Secondary | ICD-10-CM

## 2017-03-17 DIAGNOSIS — K769 Liver disease, unspecified: Secondary | ICD-10-CM

## 2017-03-17 DIAGNOSIS — Z7901 Long term (current) use of anticoagulants: Secondary | ICD-10-CM

## 2017-03-17 DIAGNOSIS — Z944 Liver transplant status: Principal | ICD-10-CM

## 2017-03-17 DIAGNOSIS — Z79899 Other long term (current) drug therapy: Secondary | ICD-10-CM

## 2017-03-17 LAB — COMPREHENSIVE METABOLIC PANEL
ALBUMIN: 3.7 g/dL (ref 3.5–5.0)
ALT (SGPT): 19 U/L (ref 19–72)
ANION GAP: 8 mmol/L — ABNORMAL LOW (ref 9–15)
AST (SGOT): 14 U/L — ABNORMAL LOW (ref 19–55)
BILIRUBIN TOTAL: 0.9 mg/dL (ref 0.0–1.2)
BLOOD UREA NITROGEN: 34 mg/dL — ABNORMAL HIGH (ref 7–21)
BUN / CREAT RATIO: 26
CALCIUM: 10.1 mg/dL (ref 8.5–10.2)
CHLORIDE: 107 mmol/L (ref 98–107)
CO2: 26 mmol/L (ref 22.0–30.0)
CREATININE: 1.33 mg/dL — ABNORMAL HIGH (ref 0.70–1.30)
EGFR MDRD AF AMER: >=60 mL/min/{1.73_m2} (ref >=60)
EGFR MDRD NON AF AMER: 53 mL/min/{1.73_m2} — ABNORMAL LOW (ref >=60)
GLUCOSE RANDOM: 132 mg/dL (ref 65–179)
POTASSIUM: 4.6 mmol/L (ref 3.5–5.0)
PROTEIN TOTAL: 6.4 g/dL — ABNORMAL LOW (ref 6.5–8.3)
SODIUM: 141 mmol/L (ref 135–145)

## 2017-03-17 LAB — CBC W/ AUTO DIFF
BASOPHILS ABSOLUTE COUNT: 0 10*9/L (ref 0.0–0.1)
EOSINOPHILS ABSOLUTE COUNT: 0.1 10*9/L (ref 0.0–0.4)
HEMOGLOBIN: 10.9 g/dL — ABNORMAL LOW (ref 13.5–17.5)
LARGE UNSTAINED CELLS: 2 % (ref 0–4)
LYMPHOCYTES ABSOLUTE COUNT: 1.1 10*9/L — ABNORMAL LOW (ref 1.5–5.0)
MEAN CORPUSCULAR HEMOGLOBIN CONC: 33.5 g/dL (ref 31.0–37.0)
MEAN CORPUSCULAR HEMOGLOBIN: 31.3 pg (ref 26.0–34.0)
MEAN CORPUSCULAR VOLUME: 93.3 fL (ref 80.0–100.0)
MEAN PLATELET VOLUME: 9.7 fL (ref 7.0–10.0)
NEUTROPHILS ABSOLUTE COUNT: 2.2 10*9/L (ref 2.0–7.5)
PLATELET COUNT: 114 10*9/L — ABNORMAL LOW (ref 150–440)
RED BLOOD CELL COUNT: 3.5 10*12/L — ABNORMAL LOW (ref 4.50–5.90)
RED CELL DISTRIBUTION WIDTH: 16.6 % — ABNORMAL HIGH (ref 12.0–15.0)
WBC ADJUSTED: 3.8 10*9/L — ABNORMAL LOW (ref 4.5–11.0)

## 2017-03-17 LAB — MAGNESIUM: Magnesium:MCnc:Pt:Ser/Plas:Qn:: 1.8

## 2017-03-17 LAB — PHOSPHORUS: Phosphate:MCnc:Pt:Ser/Plas:Qn:: 2.9

## 2017-03-17 LAB — BILIRUBIN DIRECT: Bilirubin.glucuronidated:MCnc:Pt:Ser/Plas:Qn:: 0.4

## 2017-03-17 LAB — INR: Lab: 1.52

## 2017-03-17 LAB — GAMMA GLUTAMYL TRANSFERASE: Gamma glutamyl transferase:CCnc:Pt:Ser/Plas:Qn:: 114 — ABNORMAL HIGH

## 2017-03-17 LAB — CREATININE: Creatinine:MCnc:Pt:Ser/Plas:Qn:: 1.33 — ABNORMAL HIGH

## 2017-03-17 LAB — MEAN PLATELET VOLUME: Lab: 9.7

## 2017-03-18 LAB — CYCLOSPORINE, TROUGH: Lab: 119

## 2017-03-21 ENCOUNTER — Ambulatory Visit: Admit: 2017-03-21 | Discharge: 2017-03-22 | Payer: MEDICARE

## 2017-03-21 DIAGNOSIS — Z5181 Encounter for therapeutic drug level monitoring: Secondary | ICD-10-CM

## 2017-03-21 DIAGNOSIS — Z944 Liver transplant status: Principal | ICD-10-CM

## 2017-03-21 DIAGNOSIS — Z79899 Other long term (current) drug therapy: Secondary | ICD-10-CM

## 2017-03-21 DIAGNOSIS — K769 Liver disease, unspecified: Secondary | ICD-10-CM

## 2017-03-21 DIAGNOSIS — Z7901 Long term (current) use of anticoagulants: Secondary | ICD-10-CM

## 2017-03-21 LAB — CBC W/ AUTO DIFF
BASOPHILS ABSOLUTE COUNT: 0 10*9/L (ref 0.0–0.1)
EOSINOPHILS ABSOLUTE COUNT: 0 10*9/L (ref 0.0–0.4)
HEMATOCRIT: 31.4 % — ABNORMAL LOW (ref 41.0–53.0)
HEMOGLOBIN: 10.7 g/dL — ABNORMAL LOW (ref 13.5–17.5)
LARGE UNSTAINED CELLS: 3 % (ref 0–4)
LYMPHOCYTES ABSOLUTE COUNT: 1.2 10*9/L — ABNORMAL LOW (ref 1.5–5.0)
MEAN CORPUSCULAR HEMOGLOBIN CONC: 34.1 g/dL (ref 31.0–37.0)
MEAN CORPUSCULAR HEMOGLOBIN: 31.6 pg (ref 26.0–34.0)
MEAN CORPUSCULAR VOLUME: 92.8 fL (ref 80.0–100.0)
MONOCYTES ABSOLUTE COUNT: 0.3 10*9/L (ref 0.2–0.8)
PLATELET COUNT: 122 10*9/L — ABNORMAL LOW (ref 150–440)
RED BLOOD CELL COUNT: 3.39 10*12/L — ABNORMAL LOW (ref 4.50–5.90)
WBC ADJUSTED: 3.8 10*9/L — ABNORMAL LOW (ref 4.5–11.0)

## 2017-03-21 LAB — COMPREHENSIVE METABOLIC PANEL
ALBUMIN: 3.8 g/dL (ref 3.5–5.0)
ALKALINE PHOSPHATASE: 150 U/L — ABNORMAL HIGH (ref 38–126)
ALT (SGPT): 16 U/L — ABNORMAL LOW (ref 19–72)
ANION GAP: 6 mmol/L — ABNORMAL LOW (ref 9–15)
AST (SGOT): 14 U/L — ABNORMAL LOW (ref 19–55)
BILIRUBIN TOTAL: 0.7 mg/dL (ref 0.0–1.2)
BLOOD UREA NITROGEN: 29 mg/dL — ABNORMAL HIGH (ref 7–21)
BUN / CREAT RATIO: 21
CALCIUM: 10.1 mg/dL (ref 8.5–10.2)
CHLORIDE: 108 mmol/L — ABNORMAL HIGH (ref 98–107)
CREATININE: 1.37 mg/dL — ABNORMAL HIGH (ref 0.70–1.30)
EGFR MDRD AF AMER: >=60 mL/min/{1.73_m2} (ref >=60)
EGFR MDRD NON AF AMER: 52 mL/min/{1.73_m2} — ABNORMAL LOW (ref >=60)
GLUCOSE RANDOM: 94 mg/dL (ref 65–99)
POTASSIUM: 4.7 mmol/L (ref 3.5–5.0)
PROTEIN TOTAL: 6.3 g/dL — ABNORMAL LOW (ref 6.5–8.3)
SODIUM: 140 mmol/L (ref 135–145)

## 2017-03-21 LAB — MAGNESIUM: Magnesium:MCnc:Pt:Ser/Plas:Qn:: 1.9

## 2017-03-21 LAB — PHOSPHORUS: Phosphate:MCnc:Pt:Ser/Plas:Qn:: 3.3

## 2017-03-21 LAB — GAMMA GLUTAMYL TRANSFERASE: Gamma glutamyl transferase:CCnc:Pt:Ser/Plas:Qn:: 101

## 2017-03-21 LAB — GAMMA GT: GAMMA GLUTAMYL TRANSFERASE: 101 U/L (ref 12–109)

## 2017-03-21 LAB — PROTIME: Lab: 21.1 — ABNORMAL HIGH

## 2017-03-21 LAB — LARGE UNSTAINED CELLS: Lab: 3

## 2017-03-21 LAB — EGFR MDRD AF AMER: Glomerular filtration rate/1.73 sq M.predicted.black:ArVRat:Pt:Ser/Plas/Bld:Qn:Creatinine-based formula (MDRD): >=60

## 2017-03-21 LAB — PROTIME-INR: PROTIME: 21.1 s — ABNORMAL HIGH (ref 10.2–12.8)

## 2017-03-21 LAB — BILIRUBIN DIRECT: Bilirubin.glucuronidated:MCnc:Pt:Ser/Plas:Qn:: 0.3

## 2017-03-22 LAB — CYCLOSPORINE, TROUGH: Lab: 133

## 2017-03-23 MED FILL — ON CALL EXPRESS BLOOD GLU/EXPRESS/STRP: ON CALL EXPRESS BLOOD GLU/EXPRESS/STRP | 25 days supply | Qty: 100 | Fill #0

## 2017-03-24 MED ORDER — NEORAL 100 MG CAPSULE: each | 11 refills | 0 days

## 2017-03-24 MED ORDER — NEORAL 100 MG CAPSULE
ORAL_CAPSULE | Freq: Two times a day (BID) | ORAL | 11 refills | 0 days | Status: CP
Start: 2017-03-24 — End: 2017-05-19

## 2017-03-24 NOTE — Unmapped (Signed)
Reviewed labs with NP Daphine Deutscher who instructed to increase Neoral to 200 mg BID and no intervention at this time for the coumadin.

## 2017-03-28 ENCOUNTER — Ambulatory Visit
Admit: 2017-03-28 | Discharge: 2017-03-31 | Payer: MEDICARE | Attending: Rehabilitative and Restorative Service Providers" | Primary: Rehabilitative and Restorative Service Providers"

## 2017-03-28 ENCOUNTER — Ambulatory Visit: Admit: 2017-03-28 | Discharge: 2017-03-29 | Payer: MEDICARE

## 2017-03-28 ENCOUNTER — Ambulatory Visit: Admit: 2017-03-28 | Discharge: 2017-03-29 | Payer: MEDICARE | Attending: Neurology | Primary: Neurology

## 2017-03-28 DIAGNOSIS — Z5181 Encounter for therapeutic drug level monitoring: Secondary | ICD-10-CM

## 2017-03-28 DIAGNOSIS — Z7901 Long term (current) use of anticoagulants: Secondary | ICD-10-CM

## 2017-03-28 DIAGNOSIS — E1142 Type 2 diabetes mellitus with diabetic polyneuropathy: Secondary | ICD-10-CM

## 2017-03-28 DIAGNOSIS — G6289 Other specified polyneuropathies: Secondary | ICD-10-CM

## 2017-03-28 DIAGNOSIS — Z7409 Other reduced mobility: Secondary | ICD-10-CM

## 2017-03-28 DIAGNOSIS — R29898 Other symptoms and signs involving the musculoskeletal system: Principal | ICD-10-CM

## 2017-03-28 DIAGNOSIS — G63 Polyneuropathy in diseases classified elsewhere: Secondary | ICD-10-CM

## 2017-03-28 DIAGNOSIS — Z944 Liver transplant status: Principal | ICD-10-CM

## 2017-03-28 DIAGNOSIS — G6281 Critical illness polyneuropathy: Principal | ICD-10-CM

## 2017-03-28 DIAGNOSIS — K769 Liver disease, unspecified: Secondary | ICD-10-CM

## 2017-03-28 DIAGNOSIS — Z79899 Other long term (current) drug therapy: Secondary | ICD-10-CM

## 2017-03-28 DIAGNOSIS — Z9181 History of falling: Secondary | ICD-10-CM

## 2017-03-28 DIAGNOSIS — B1920 Unspecified viral hepatitis C without hepatic coma: Secondary | ICD-10-CM

## 2017-03-28 LAB — ALT (SGPT): Alanine aminotransferase:CCnc:Pt:Ser/Plas:Qn:: 74 — ABNORMAL HIGH

## 2017-03-28 LAB — LARGE UNSTAINED CELLS: Lab: 2

## 2017-03-28 LAB — CBC W/ AUTO DIFF
BASOPHILS ABSOLUTE COUNT: 0 10*9/L (ref 0.0–0.1)
HEMATOCRIT: 32 % — ABNORMAL LOW (ref 41.0–53.0)
HEMOGLOBIN: 10.7 g/dL — ABNORMAL LOW (ref 13.5–17.5)
LARGE UNSTAINED CELLS: 2 % (ref 0–4)
LYMPHOCYTES ABSOLUTE COUNT: 1 10*9/L — ABNORMAL LOW (ref 1.5–5.0)
MEAN CORPUSCULAR HEMOGLOBIN CONC: 33.3 g/dL (ref 31.0–37.0)
MEAN CORPUSCULAR HEMOGLOBIN: 31.2 pg (ref 26.0–34.0)
MEAN CORPUSCULAR VOLUME: 93.5 fL (ref 80.0–100.0)
MEAN PLATELET VOLUME: 8.8 fL (ref 7.0–10.0)
NEUTROPHILS ABSOLUTE COUNT: 3.2 10*9/L (ref 2.0–7.5)
PLATELET COUNT: 175 10*9/L (ref 150–440)
RED BLOOD CELL COUNT: 3.42 10*12/L — ABNORMAL LOW (ref 4.50–5.90)
RED CELL DISTRIBUTION WIDTH: 15.6 % — ABNORMAL HIGH (ref 12.0–15.0)
WBC ADJUSTED: 4.7 10*9/L (ref 4.5–11.0)

## 2017-03-28 LAB — GAMMA GLUTAMYL TRANSFERASE: Gamma glutamyl transferase:CCnc:Pt:Ser/Plas:Qn:: 305 — ABNORMAL HIGH

## 2017-03-28 LAB — COMPREHENSIVE METABOLIC PANEL
ALT (SGPT): 74 U/L — ABNORMAL HIGH (ref 19–72)
ANION GAP: 9 mmol/L (ref 9–15)
AST (SGOT): 42 U/L (ref 19–55)
BILIRUBIN TOTAL: 0.7 mg/dL (ref 0.0–1.2)
BLOOD UREA NITROGEN: 35 mg/dL — ABNORMAL HIGH (ref 7–21)
BUN / CREAT RATIO: 24
CALCIUM: 10.2 mg/dL (ref 8.5–10.2)
CHLORIDE: 108 mmol/L — ABNORMAL HIGH (ref 98–107)
CO2: 22 mmol/L (ref 22.0–30.0)
CREATININE: 1.45 mg/dL — ABNORMAL HIGH (ref 0.70–1.30)
EGFR MDRD AF AMER: 59 mL/min/{1.73_m2} — ABNORMAL LOW (ref >=60)
EGFR MDRD NON AF AMER: 48 mL/min/{1.73_m2} — ABNORMAL LOW (ref >=60)
GLUCOSE RANDOM: 109 mg/dL (ref 65–179)
POTASSIUM: 4.7 mmol/L (ref 3.5–5.0)
PROTEIN TOTAL: 6.3 g/dL — ABNORMAL LOW (ref 6.5–8.3)
SODIUM: 139 mmol/L (ref 135–145)

## 2017-03-28 LAB — PROTIME: Lab: 26.6 — ABNORMAL HIGH

## 2017-03-28 LAB — BILIRUBIN DIRECT: Bilirubin.glucuronidated:MCnc:Pt:Ser/Plas:Qn:: 0.5 — ABNORMAL HIGH

## 2017-03-28 LAB — PHOSPHORUS: Phosphate:MCnc:Pt:Ser/Plas:Qn:: 4.2

## 2017-03-28 LAB — PROTIME-INR: INR: 2.33

## 2017-03-28 LAB — MAGNESIUM: Magnesium:MCnc:Pt:Ser/Plas:Qn:: 1.6

## 2017-03-28 LAB — CYCLOSPORINE, TROUGH: Lab: 150

## 2017-03-28 NOTE — Unmapped (Addendum)
Continue with physical therapy  Take Lyrica 50 mg by mouth at bed time.   Check with your transplant doctor/team/nurse before starting lyrica.   ================  Numbness and Tingling: Care Instructions  Your Care Instructions    Many things can cause numbness or tingling. Swelling may put pressure on a nerve. This could cause you to lose feeling or have a pins-and-needles sensation on part of your body. Nerves may be damaged from trauma, toxins, or diseases, such as diabetes or multiple sclerosis (MS). Sometimes, though, the cause is not clear.  If there is no clear reason for your symptoms, and you are not having any other symptoms, your doctor may suggest watching and waiting for a while to see if the numbness or tingling goes away on its own. Your doctor may want you to have blood or nerve tests to find the cause of your symptoms.  Follow-up care is a key part of your treatment and safety. Be sure to make and go to all appointments, and call your doctor if you are having problems. It's also a good idea to know your test results and keep a list of the medicines you take.  How can you care for yourself at home?  ?? If your doctor prescribes medicine, take it exactly as directed. Call your doctor if you think you are having a problem with your medicine.  ?? If you have any swelling, put ice or a cold pack on the area for 10 to 20 minutes at a time. Put a thin cloth between the ice and your skin.  When should you call for help?  Call 911 anytime you think you may need emergency care. For example, call if:  ?? ?? You have weakness, numbness, or tingling in both legs.   ?? ?? You lose bowel or bladder control.   ?? ?? You have symptoms of a stroke. These may include:  ? Sudden numbness, tingling, weakness, or loss of movement in your face, arm, or leg, especially on only one side of your body.  ? Sudden vision changes.  ? Sudden trouble speaking.  ? Sudden confusion or trouble understanding simple statements.  ? Sudden problems with walking or balance.  ? A sudden, severe headache that is different from past headaches.   ??Watch closely for changes in your health, and be sure to contact your doctor if you have any problems, or if:  ?? ?? You do not get better as expected.   Where can you learn more?  Go to [facility] at [email or web address]  Select Preferences in the upper right hand corner, then select Health Library under Resources. Enter [phone] in the search box to learn more about Numbness and Tingling: Care Instructions.  Current as of: August 14, 2016  Content Version: 11.9  ?? 2006-2018 [employer]. Care instructions adapted under license by [facility]. If you have questions about a medical condition or this instruction, always ask your healthcare professional. [employer] disclaims any warranty or liability for your use of this information.

## 2017-03-29 NOTE — Unmapped (Signed)
Patient vomited once last week and wife unsure what caused it patient feels fine today.  Spoke with NP Daphine Deutscher who confirmed patient is fine to repeat labs next week.  Wife agreed to repeat labs as planned next week.

## 2017-03-30 NOTE — Unmapped (Signed)
[facility] ALLIED HEALTH PT [facility]  OUTPATIENT PHYSICAL THERAPY  Treatment Note  03/28/2017    Patient Name: Jeffrey Ward  Date of Birth:1950-11-25  Session Number: 6  Diagnosis:   Encounter Diagnoses   Name Primary?   ??? Weakness of both lower extremities Yes   ??? Impaired functional mobility, balance, gait, and endurance    ??? Other polyneuropathy    ??? 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 Past medical history also significant for cancer, peripheral neuropathy, hepatitis C, and heart failure. Pt seemed slightly weaker today than prior to his illness (hospitalized with flu before Christmas), but he is generally doing well. He is walking with a SPC cane at home and for short community distances. Pt needs skilled PT services to address B LE weakness and deficits in balance, gait, and endurance in order to promote safe participation in home and community activities.        Short Term Goals:       Short Term Goal 1: Pt will demonstrate independence in performance of HEP x 1 in clinic to maximize benefits of PT. GOAL MET (02/17/17)  Time Frame : 4 weeks   Short Term Goal 2: Pt will perform 5x STS with UE support in < 17 seconds, indicating improvement in functional mobility. GOAL MET (02/07/17)   Time Frame : 4 weeks   Short Term Goal 3: Pt will perform tandem stance > 10 seconds, indicating improved balance and reduced risk for falls GOAL MET (02/07/17)   Time Frame : 4 weeks                          Long Term Goals:  Long Term Goal 1: Pt will demonstrate strength gain of +1 per MMT for every LE muscle graded < 5/5, indicating improved strength for functional mobility   Time Frame: 10 weeks   Long Term Goal 2: Pt will be able to stand up from a standard chair 5 times consecutively without use of UE support, indicating improvement in functional lower extremity muscle strength. GOAL MET (02/07/17) Revised Goal: Pt will demonstrate performance of 5x STS in < 12 sec, indicating improved functional mobility and reduced risk for falls.   Time Frame: 10 weeks   Long Term Goal 3: Pt will perform TUG in < 12 seconds, indicating reduced risk for falls. GOAL MET (03/28/17)   Time Frame: 10 weeks   Long Term Goal 4: (NEW GOAL added 02/24/17) Pt will be able to maintain SLS for at least 6.5 sec on each LE in order to demonstrate decrease in risk for falls.   Time Frame: 10 weeks               PLAN:  2x week   for Duration: 10 weeks    Next Visit Plan: SLS activities, ambulation outdoors with SPC    SUBJECTIVE:  Subjective : Pt reports that he lost strength when he was ill with the flu, but is doing much better now. He and his  wife state that pt has been unsteady on his feet at times, but has not had any actual falls. Pt has been getting out to places like [employer] and [employer] a couple of times a week and walking around the store for 20-30 min using rollator or shopping cart.  Precautions: Cancer history, Fall risk       OBJECTIVE:      Pain: Yes  Location: low back  Current Pain Level: 5      Pain Frequency: Constant/continuous                        Vital Signs:  Sitting: BP = 144/69 mmHg, HR = 68 bpm, O2 = 96%  On 02/07/17: Sitting after 15 minutes of activity = 137/116 mmHg, HR = 82 bpm (69 bpm at rest), 98% O2  Pt demonstrated SOB and fatigue during functional mobility, O2 Sat remained > 98% throughout session  On 03/28/17: Sitting after STS exercise = 135/88, HR = 93 bpm, O2 sat = 99%  ??  Special Cerebellar Tests:   F-N-F: NT  RAM (ankle DF): WNLs, symmetrical  H-S: NT  ??  Visual/Vestibular Testing: not indicated  ??  Cognitive Status/Communication: Patient is alert, oriented, and cooperative, and able to provide historical information. He has a weak, breathy voice, but his speech is easily intelligible.   ??  Posture/Observations:   Sitting: rounded shoulders and forward head, pt demonstrated sacral sitting  Standing: forward flexed posture, rounded shoulders and forward head  Skin assessment/Edema: dry skin, good color and warmth  ??  ??  Sensation:   5.07 Semmes-Weinstein Monofilament: diminished bilateral LE  Impaired  R foot = 7/10, R LE (ankle to knee) = 7/10  L foot = 7/10, L LE (ankle to knee) = 7/10  Pt reports diminished sensation L > R   ??  Proprioception:  Intact at the great toe bilaterally, with 9 out of 10 correct responses on the L and 10 out of 10 correct responses on the R.   ??  Reflexes:   Not Tested  ??  Range of Motion/Flexibility:   Within normal limits   ??  Strength/MMT:   UE muscle strength is generally in the 4/5 range   ??  LE MMT   ??  LE MMT  Left  Right    Hip flex: (L2)  3+/5  4/5    Hip abd:  3-/5  3/5    Hip ext: -/5 -/5   Knee ext: (L3)  3+/5  4/5    Knee flex: (S2)  4/5  4/5    Ankle DF: (L4)  3+/5  3+/5    Ankle PF: (S1)  2+/5  2+/5    ??  Functional Mobility:  Pt is independent in all bed mobility and transfers, but performs them slowly. Pt is unable to come to standing from standard chair independently without use of UEs for support.  Rolling: independent  Supine to sit: independent  Sit to supine: independent  Sit to stand: modified independent with use of bilateral UE support  ??  Balance:   Patient able to maintain balance in sitting and in typical stance position independently without UE support. Able to maintain feet together and semi-tandem stance positions for >10 sec without UE support.  ??  Gait Analysis:  decreased gait speed  decreased R step length  decreased R toe clearance   Pt demonstrated decreased bilateral push-off, heel strike R > L, and step  length  Patient ambulates with rollator   ??  Functional Test/Outcome Measures:  5XSTS: unable; pt completed modified testing with 5 stands in 20.9 sec using B UE support; on 02/07/17, 15.6 sec without UE support; on 02/17/17, best time was 17.2 sec without UE support; on 02/24/17, best time was 15.0 sec without UE support; on 03/28/17, best time = 13.4 sec without UE support    ??  TUG: 18.6 sec with rollator; on 02/17/17 = 12.7 sec with rollator, 13.6 sec without an AD; on 03/28/17 = 9.6 sec with SPC   ??  Tandem Stance: 1-2 sec with L foot forward; 4.9 sec with R foot forward, on 02/07/17: L foot forward = 14.2 sec, R foot forward = 27 sec; on 03/28/17: L foot forward = 15.3 sec, R foot forward = 20.7 sec  ??  SLS: unable, on 02/07/17: L foot = 1-2 sec, R foot = 4.6 sec  ??  Gait speed on 02/07/17: 0.71 m/s, pt performed with rollator on flat, even surface across 20' with supervision; on 02/14/17 = 0.73 m/sec average over ~300'    Education Provided: HEP  Education results: Verbalized understanding, demonstrated understanding       Total Time: 55    Today's Charges:  Treatment Rendered: 55 minutes  Therapeutic Exercise: 30 minutes (2 units)  - STS 2x5 with CGA and without UE support; performance slightly improved compared to previous  - Standing up from chair, walking 10', turning 180 degrees, walking back to chair, and sitting down (during administration of TUG) x 3; improved compared to previous  - Unilateral ankle DF in step stance position without UE support, 1x20 bilaterally, with CGA; generally performed well  - Bridging exercise in supine, 2x12 with 3-sec holds; pt needed cueing to slow down (and hold for full 3 seconds)    - Supine SLR 1x15 bilaterally; pt needed occasional cueing to maintain full knee extension on the L  - Side-lying hip abduction 2x15 bilaterally with 3-sec holds; pt performed well without cueing     Neuromuscular Re-education: 25 minutes (2 units)  - Toe taps onto 8 step without UE support, 1x15 bilaterally with CGA; generally performed well  - Attempting to maintain balance with narrow BOS (tandem, SLS) without UE support with CGA, multiple reps; SLS continues to be very challenging activity for pt  - Maintaining balance while standing on one foot and tapping targets (collapsible cones) arranged in a semicircle on the floor with other foot, multiple reps with CGA; pt reported R knee pain when tapping with L foot during this activity today   - Maintaining balance while standing on firm surface with feet together and throwing/catching small weighted ball using rebounder, 3x10, with CGA; challenging activity for pt, but generally performed well                      I attest that I have reviewed the above information.  Signed: Sedalia Muta, PT  03/28/2017 11:07 PM

## 2017-03-30 NOTE — Unmapped (Addendum)
DIVISION: Neuromuscular Disorders  FINL 194  [facility] NEUROLOGY CLINIC [address] [place]  [address]  [place] [address]  [phone]  Division of Neuromuscular disorders    PATIENT :  Jeffrey Ward  DOB :  11-27-1950  MRN          : [id number]   PCP          : [name]  DATE :  03/28/2017    [name] is seen in consultation at the [place] [facility] at the request of Dr. Florene Glen, for evaluation of neuropathy and abnormal EMG/NCS.  Patient is the primary historian and medical records were reviewed. Patient is accompanied to today's clinic visit by his wife, who is his caregiver.    CHIEF COMPLAINT: Pins and needle sensation in both feet.    HISTORY OF PRESENT ILLNESS:  [name] is a 67 year-old, right-handed, Caucasian man who is s/p liver transplant secondary to hepatocellular carcinoma from chronic hepatitis C infection.  The latter was treated with Harvoni.  He also has diabetes.  During his liver transplant surgery in 09/2016 he developed complications which led him to stay in the hospital for  3 months out of which he reportedly stayed in the ICU for 6 weeks.  He developed severe PAH, atrial fibrillation requiring permanent pacemaker and is on anticoagulation with warfarin. He also developed acute on chronic kidney disease and needed CRRT.  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 During his inhospital stay he developed asterixis and myoclonic jerks for which neurology was consulted 10/07/2016.  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  The recommendation was made for EMG/NCS as the patient had history of numbness and tingling in both feet for many years and that worsened following the liver transplant.  EMG/NCS was done as outpatient at [facility],, CNP lab on 12/26/2016, study was performed on his left upper and lower extremities.  This reportedly showed mild axonal sensorimotor polyneuropathy, left median mononeuropathy at wrist with probable left superimposed lumbosacral radiculopathy.    Patient takes cyclosporine as part of immunosuppressants following his liver transplant.  He reports developing tremor in both hand.  He complains of distal lower extremity weakness and dysesthesias.  He complains of pins and needles sensation in both feet particularly in the left foot he feels as though his left foot is covered with ice cubes.  It disturbs his sleep.  He also describes occasionally he feels an electrical shock-like sensation in his left foot.  He is asking for Lyrica.  He had tried gabapentin in the past which did not help and he had excessive drowsiness and confusional episode according to his wife.  He has history of chronic back pain since more than 30 years when fell from a tree.  He reports that he broke his spine in three places, had thoracolumbar laminectomy and has T12 compression fractures L2 compression fractures and also fractured his left ankle.  He had ankle fusion surgery.     He has a Rollator walker but seldom uses.  He is using a cane today. He is supposed to wear a mask for protection from droplet infection, but is not wearing a mask for the clinic visit today.  He had recently (03/01/17) acquired influenza infection and was hospitalized and treated with Tamiflu. His wife is full-time caregiver  for him she reports that he has lost more than 50 pounds since 2017.  He reports the  tremors make cutting food difficult and he spills food, at times, when eating but does not required no assistance in feeding.  He admits trouble turning in bed.  He avoids taking stairs.      He denies loss of balance. He denies any problem with finger dexterity but reports he takes time to perform tasks like, buttoning his shirt, tying his shoe laces, opening jar tops, turning knobs and opening doors.  He denies any chewing problems but he reports that food sometimes gets stuck in the back of his throat.  He denies any orthopnea.  He is able to dress and able to shower. No bowel or bladder incontinence. He is able to raise himself from a toilet seat without difficulty and is able to push the shopping cart.      He gets PT/OT twice a week, he continued to have PT OT since his discharge from the hospital.     REVIEW OF LABORATORY AND DIAGNOSTIC STUDIES:  Mentioned in HPI.    REVIEW OF SYSTEMS: A 12 system review of systems was negative except as noted in HPI.    ALLERGIES:  Patient has no known allergies.    MEDICATIONS:    Current Outpatient Prescriptions   Medication Sig Dispense Refill   ??? acetaminophen (TYLENOL) 325 MG tablet Take 1 tablet (325 mg total) by mouth every four (4) hours as needed for pain. 100 tablet 2   ??? blood sugar diagnostic (FREESTYLE TEST) Strp by Other route Four (4) times a day (before meals and nightly). 100 each 11   ??? blood-glucose meter (GLUCOSE MONITORING KIT) kit Use as instructed 1 each 0   ??? capsaicin (ZOSTRIX) 0.025 % cream Apply topically Two (2) times a day. 60 g 0   ??? cholecalciferol, vitamin D3, 2,000 unit cap 1 capsule (2,000 Units total) by PEG Tube route daily. 30 capsule 11   ??? digoxin (LANOXIN) 125 mcg tablet 0.5 tablets (62.5 mcg total) by G-tube route daily. 15 tablet 11   ??? insulin ASPART (NOVOLOG FLEXPEN U-100 INSULIN) 100 unit/mL injection pen Inject 0.06 mL (6 Units total) under the skin Three (3) times a day before meals. 4 mL 11   ??? insulin ASPART (NOVOLOG FLEXPEN) 100 unit/mL injection pen Additional mealtime per SSI-  151-200= 2 units,  201-250= 4 units, 251-300=add 6 units, 301-350=8 units, 351-400=10 units, >400 =12 units 1.8 mL 0   ??? insulin glargine (LANTUS) 100 unit/mL (3 mL) injection pen Inject 0.12 mL (12 Units total) under the skin nightly. 4 mL 11   ??? lancets Misc 1 each by Miscellaneous route Four (4) times a day (before meals and nightly). 100 each 11   ??? MAGNESIUM, AMINO ACID CHELATE, 133 mg tablet TAKE 1 TABLET BY MOUTH TWICE DAILY 100 tablet PRN   ??? melatonin 3 mg Tab Take 1 tablet (3 mg total) by mouth nightly. 30 tablet 0   ??? mycophenolate (CELLCEPT) 250 mg capsule Take 2 capsules (500 mg total) by mouth Two (2) times a day. 360 capsule 3   ??? NEORAL 100 mg capsule Take 2 capsules (200 mg total) by mouth Two (2) times a day. 120 capsule 11   ??? omeprazole (PRILOSEC) 20 MG capsule Take 1 capsule (20 mg total) by mouth daily. 30 capsule 1   ??? pen needle, diabetic (ULTICARE PEN NEEDLE) 32 gauge x 5/32 Ndle 1 each by Miscellaneous  route Four (4) times a day (before meals and nightly). 90 each 3   ??? traMADol (ULTRAM) 50 mg tablet Take 50 mg by mouth. Every 4 to 6 hours PRN     ??? warfarin (COUMADIN) 1 MG tablet Take 3 tablets (3 mg total) by mouth daily with evening meal. 90 tablet 0     Current Facility-Administered Medications   Medication Dose Route Frequency Provider Last Rate Last Dose   ??? pregabalin (LYRICA) capsule 50 mg  50 mg Oral Nightly Oran Rein, MD           (Not in a hospital admission)    MEDICAL HISTORY:  Patient Active Problem List   Diagnosis   ??? Hepatitis C   ??? Low back pain   ??? Hematuria   ??? Calculus of ureter   ??? Anemia   ??? Benign prostatic hyperplasia   ??? Chronic pain disorder   ??? Dysrhythmia   ??? History of gastroesophageal reflux (GERD)   ??? History of hepatitis C virus infection   ??? History of substance abuse   ??? Hyperglycemia, unspecified   ??? Intermittent vertigo   ??? Microscopic hematuria   ??? Postlaminectomy syndrome, lumbar region   ??? Thrombocytopenia (CMS-HCC)   ??? Vitamin D deficiency   ??? Atrial fibrillation with RVR (CMS-HCC)   ??? Secondary esophageal varices (CMS-HCC)   ??? Right ventricular dilation   ??? End-stage liver disease (CMS-HCC)   ??? HCC (hepatocellular carcinoma) (CMS-HCC)   ??? Paroxysmal atrial fibrillation (CMS-HCC)   ??? Hepatic encephalopathy (CMS-HCC)   ??? S/P liver transplant (CMS-HCC)   ??? Pulmonary hypertension, moderate to severe (CMS-HCC)   ??? Acute right heart failure (CMS-HCC)   ??? Tracheostomy dependent (CMS-HCC)   ??? Mixed level of activity delirium due to multiple etiologies, resolved   ??? Insomnia   ??? Peripheral neuropathy   ??? Influenza A     Past Medical History:   Diagnosis Date   ??? Cancer (CMS-HCC)    ??? Cirrhosis (CMS-HCC)    ??? Diabetes (CMS-HCC)    ??? Hepatitis C 07/17/2012   ??? Liver disease    ??? Low back pain 07/17/2012   ??? Varices, esophageal (CMS-HCC)        SURGICAL HISTORY:  Past Surgical History:   Procedure Laterality Date   ??? ANKLE SURGERY     ??? BACK SURGERY     ??? BACK SURGERY     ??? CHG US GUIDE, TISSUE ABLATION N/A 01/22/2016    Procedure: ULTRASOUND GUIDANCE FOR, AND MONITORING OF, PARENCHYMAL TISSUE ABLATION;  Surgeon: Particia Nearing, MD;  Location: MAIN OR [facility];  Service: Transplant   ??? IR EMBOLIZATION ORGAN ISCHEMIA, TUMORS, INFAR  06/16/2016    IR EMBOLIZATION ORGAN ISCHEMIA, TUMORS, INFAR 06/16/2016 Ammie Dalton, MD IMG VIR H&V [facility]   ??? PR COLON CA SCRN NOT HI RSK IND N/A 02/27/2015    Procedure: COLOREC CNCR SCR;COLNSCPY NO;  Surgeon: Vonda Antigua, MD;  Location: GI PROCEDURES MEMORIAL [facility];  Service: Gastroenterology   ??? PR ENDOSCOPIC ULTRASOUND EXAM N/A 02/27/2015    Procedure: UGI ENDO; W/ENDO ULTRASOUND EXAM INCLUDES ESOPHAGUS, STOMACH, &/OR DUODENUM/JEJUNUM;  Surgeon: Vonda Antigua, MD;  Location: GI PROCEDURES MEMORIAL [facility];  Service: Gastroenterology   ??? PR ERCP BALLOON DILATE BILIARY/PANC DUCT/AMPULLA EA N/A 02/10/2017    Procedure: ERCP;WITH TRANS-ENDOSCOPIC BALLOON DILATION OF BILIARY/PANCREATIC DUCT(S) OR OF AMPULLA, INCLUDING SPHINCTERECTOMY, WHEN PERFOREMD,EACH DUCT ([id number]);  Surgeon: Mayford Knife, MD;  Location: GI PROCEDURES MEMORIAL [facility];  Service: Gastroenterology   ??? PR ERCP REMOVE FOREIGN BODY/STENT BILIARY/PANC DUCT N/A 02/10/2017    Procedure: ENDOSCOPIC RETROGRADE CHOLANGIOPANCREATOGRAPHY (ERCP); W/ REMOVAL OF FOREIGN BODY/STENT FROM BILIARY/PANCREATIC DUCT(S);  Surgeon: Mayford Knife, MD;  Location: GI PROCEDURES [facility];  Service: Gastroenterology   ??? PR ERCP STENT PLACEMENT BILIARY/PANCREATIC DUCT N/A 11/29/2016    Procedure: ENDOSCOPIC RETROGRADE CHOLANGIOPANCREATOGRAPHY (ERCP); WITH PLACEMENT OF ENDOSCOPIC STENT INTO BILIARY OR PANCREATIC DUCT;  Surgeon: Chriss Driver, MD;  Location: GI PROCEDURES MEMORIAL [facility];  Service: Gastroenterology   ??? PR ERCP,W/REMOVAL STONE,BIL/PANCR DUCTS N/A 11/29/2016    Procedure: ERCP; W/ENDOSCOPIC RETROGRADE REMOVAL OF CALCULUS/CALCULI FROM BILIARY &/OR PANCREATIC DUCTS;  Surgeon: Chriss Driver, MD;  Location: GI PROCEDURES MEMORIAL [facility];  Service: Gastroenterology   ??? PR ERCP,W/REMOVAL STONE,BIL/PANCR DUCTS N/A 02/10/2017    Procedure: ERCP; W/ENDOSCOPIC RETROGRADE REMOVAL OF CALCULUS/CALCULI FROM BILIARY &/OR PANCREATIC DUCTS;  Surgeon: Mayford Knife, MD;  Location: GI PROCEDURES [facility];  Service: Gastroenterology   ??? PR INSER HEART TEMP PACER ONE CHMBR N/A 10/02/2016    Procedure: Tempoarary Pacemaker Insertion;  Surgeon: Meredith Leeds, MD;  Location: [facility];  Service: Cardiology   ??? PR LAP,DIAGNOSTIC ABDOMEN N/A 01/22/2016    Procedure: Laparoscopy, Abdomen, Peritoneum, & Omentum, Diagnostic, W/Wo Collection Specimen(S) By Brushing Or Washing;  Surgeon: Particia Nearing, MD;  Location: MAIN OR [facility];  Service: Transplant   ??? PR PLACE PERCUT GASTROSTOMY TUBE N/A 11/17/2016    Procedure: UGI ENDO; W/DIRECTED PLCMT PERQ GASTROSTOMY TUBE;  Surgeon: Cletis Athens, MD;  Location: GI PROCEDURES MEMORIAL [facility];  Service: Gastroenterology   ??? PR TRACHEOSTOMY, PLANNED N/A 09/29/2016    Procedure: TRACHEOSTOMY PLANNED (SEPART PROC);  Surgeon: Katherina Mires, MD;  Location: MAIN OR [facility];  Service: Trauma   ??? PR TRANSCATH INSERT OR REPLACE LEADLESS PM VENTR N/A 10/11/2016    Procedure: Pacemaker Implant/Replace Leadless;  Surgeon: Meredith Leeds, MD;  Location: [facility];  Service: Cardiology   ??? PR TRANSPLANT LIVER,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    Procedure: Liver Allotransplantation; Orthotopic, Partial Or Whole, From Cadaver Or Living Donor, Any Age;  Surgeon: Doyce Loose, MD;  Location: MAIN OR [facility];  Service: Transplant   ??? PR TRANSPLANT,PREP DONOR LIVER, WHOLE N/A 09/15/2016    Procedure: Rogelia Boga Std Prep Cad Donor Whole Liver Gft Prior Tnsplnt,Inc Chole,Diss/Rem Surr Tissu Wo Triseg/Lobe Splt;  Surgeon: Doyce Loose, MD;  Location: MAIN OR [facility];  Service: Transplant   ??? PR UPPER GI ENDOSCOPY,BIOPSY N/A 07/17/2012    Procedure: UGI ENDOSCOPY; WITH BIOPSY, SINGLE OR MULTIPLE;  Surgeon: Alba Destine, MD;  Location: GI PROCEDURES [facility];  Service: Gastroenterology   ??? PR UPPER GI ENDOSCOPY,DIAGNOSIS N/A 02/04/2014    Procedure: UGI ENDO, INCLUDE ESOPHAGUS, STOMACH, & DUODENUM &/OR JEJUNUM; DX W/WO COLLECTION SPECIMN, BY BRUSH OR WASH;  Surgeon: Wilburt Finlay, MD;  Location: GI PROCEDURES MEMORIAL [facility];  Service: Gastroenterology   ??? PR UPPER GI ENDOSCOPY,LIGAT VARIX N/A 11/05/2013    Procedure: UGI ENDO; W/BAND LIG ESOPH &/OR GASTRIC VARICES;  Surgeon: Wilburt Finlay, MD;  Location: GI PROCEDURES [facility]OR[facility];  Service: Gastroenterology       FAMILY HISTORY:  family history includes Hypertension in his mother.    SOCIAL HISTORY:    Tobacco use:   reports that he has never smoked. He has never used smokeless tobacco.  Alcohol use:   reports that he does not drink alcohol.  Drug use:  reports that he does not use drugs.  Work and Living situation:     PHYSICAL EXAMINATION:    VITAL SIGNS:    BP 136/66 (BP Site: R Arm, BP Position: Sitting, BP Cuff Size: Large)  - Pulse 91  - Ht 172.7 cm (5' 7.99)  - Wt 63.5 kg (140 lb)  - BMI 21.29 kg/m??     General Appearance: Well developed and nourished.  No distress.   Skin: Warm, no lesions.  Eyes:  No conjunctival injection.  No ptosis.  Periorbital structures are normal.  HENT: AT/[place], preauricular pulses: 2+, no bruit.  No scalp tenderness. No facial tenderness.  Ear, nose, throat normal. Moist oral mucosa.   Neck: supple, no JVD, carotids: 2+, no tenderness, no bruit. Tracheostomy site with scar and pyogenic granuloma.   Pulmonary:  Clear breath sounds, bilaterally.  Cardiovascular:  RRR, S1, S2, no S3, S4, m/r/g. Pulses: 2+  Gastrointestinal:  BS +, soft, NT/ND, no bruit.  Musculoskeletal exam:  No edema.  No deformities of spinal curvature.  No tenderness in lumbosacral areas, bilaterally.      NEUROLOGICAL EXAM:    Higher cognitive functions:  Patient is alert and oriented to person, place, and time.  Speech is dysphonic and hoarse but fuent.  Comprehension, higher cognitive function, and praxis is intact.  Mood is euthymic and affect is broad range.  Thought process is organized, sequential, and goal-direct.  Thought content is appropriate and devoid of hallucinations and delusions.    Cranial nerves: PERRL bilaterally; normal versions and ductions; no nystagmus, no afferent pupillary defect.  Normal facial sensation and strength.  Normal hearing bilaterally.  Normal palatal elevation and tongue movement; normal shoulder shrug.      Motor exam:  Normal muscle bulk and tone.  He has action tremor/sustention on spreading his fingers of both hands. No other adventitious movements.   Gait is slightly wide-based uses a cane.  Unable to perform forced walking maneuver. No arm drift.Power 5/5 in upper and lower extremities symmetrically. FNF demonstrates, intention tremor.     Sensory exam:  Intact to pinprick, light touch.  Decreased vibratory sensation in both great toes and present at malleoli.  Joint position sensation is intact in both great toes. Double simultaneous tactile stimulation is intact without extinction.  Romberg is absent.    Reflexes:  Grade 1 and symmetric throughout biceps, triceps, brachioradialis, absent in patella and ankles.  Plantar reflexes are downgoing, bilaterally.      ASSESSMENT:  Jeffrey Ward is a 67 year-old, man who is s/p liver transplant secondary to hepatocellular carcinoma from chronic hepatitis C infection,   Diabetes, prolonged inhospital and ICU stay.  During his liver transplant surgery in 09/2016 he developed complications including severe PAH, atrial fibrillation requiring permanent pacemaker and is on anticoagulation with warfarin. He also developed acute on chronic kidney disease and needed CRRT.  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  EMG/NCS was performed as outpatient on 12/26/2016, study was performed on his left upper and lower extremities.  This reportedly showed mild axonal sensorimotor polyneuropathy, left median mononeuropathy at wrist with probable left superimposed lumbosacral radiculopathy.  Patient has symptoms suggestive of small fiber neuropathy and is requesting lyrica.     RECOMMENDATIONS:  Lyrica 50 mg PO, qhs.  He has been advised to check with the transplant team to see if  it is okay for him to take the medication.  Follow-up in 6 months.    Thank you for the referral.    Patient was seen and discussed with Dr. Hampton Abbot who helped formulate the assessment and plan.    Bonney Aid, M.D  Neuromuscular Fellow,  [facility] Dept. of Neurology,  [place], [place] [address]     ATTESTATION NOTE:  I saw and evaluated the patient, participating in the key portions of the service.  I reviewed the resident???s note and agree with the resident???s findings and plan as documented in their note.    Peripheral neuropathy, likely primarily critical illness with underlying diabetes and HCV, improving with PT and conservative measures    Will start Lyrica at a very low dose for pain if ok from the transplant team, continue PT and follow up in 6 mo to assess clinical course    Jamelle Rushing, MD  Assistant Professor  Department of Neurology  [place] [facility]

## 2017-04-04 ENCOUNTER — Ambulatory Visit
Admit: 2017-04-04 | Discharge: 2017-04-30 | Payer: MEDICARE | Attending: Rehabilitative and Restorative Service Providers" | Primary: Rehabilitative and Restorative Service Providers"

## 2017-04-04 ENCOUNTER — Ambulatory Visit: Admit: 2017-04-04 | Discharge: 2017-04-05 | Payer: MEDICARE

## 2017-04-04 DIAGNOSIS — K769 Liver disease, unspecified: Secondary | ICD-10-CM

## 2017-04-04 DIAGNOSIS — Z944 Liver transplant status: Principal | ICD-10-CM

## 2017-04-04 DIAGNOSIS — Z9181 History of falling: Secondary | ICD-10-CM

## 2017-04-04 DIAGNOSIS — Z7409 Other reduced mobility: Secondary | ICD-10-CM

## 2017-04-04 DIAGNOSIS — Z7901 Long term (current) use of anticoagulants: Secondary | ICD-10-CM

## 2017-04-04 DIAGNOSIS — G6289 Other specified polyneuropathies: Secondary | ICD-10-CM

## 2017-04-04 DIAGNOSIS — Z79899 Other long term (current) drug therapy: Secondary | ICD-10-CM

## 2017-04-04 DIAGNOSIS — R29898 Other symptoms and signs involving the musculoskeletal system: Principal | ICD-10-CM

## 2017-04-04 DIAGNOSIS — Z5181 Encounter for therapeutic drug level monitoring: Secondary | ICD-10-CM

## 2017-04-04 LAB — COMPREHENSIVE METABOLIC PANEL
ALBUMIN: 3.7 g/dL (ref 3.5–5.0)
ALKALINE PHOSPHATASE: 378 U/L — ABNORMAL HIGH (ref 38–126)
ALT (SGPT): 34 U/L (ref 19–72)
ANION GAP: 9 mmol/L (ref 9–15)
AST (SGOT): 15 U/L — ABNORMAL LOW (ref 19–55)
BILIRUBIN TOTAL: 0.6 mg/dL (ref 0.0–1.2)
BUN / CREAT RATIO: 27
CALCIUM: 10.3 mg/dL — ABNORMAL HIGH (ref 8.5–10.2)
CHLORIDE: 107 mmol/L (ref 98–107)
CO2: 24 mmol/L (ref 22.0–30.0)
CREATININE: 1.43 mg/dL — ABNORMAL HIGH (ref 0.70–1.30)
EGFR MDRD AF AMER: >=60 mL/min/{1.73_m2} (ref >=60)
EGFR MDRD NON AF AMER: 49 mL/min/{1.73_m2} — ABNORMAL LOW (ref >=60)
GLUCOSE RANDOM: 147 mg/dL — ABNORMAL HIGH (ref 65–99)
POTASSIUM: 4.5 mmol/L (ref 3.5–5.0)
PROTEIN TOTAL: 6.5 g/dL (ref 6.5–8.3)
SODIUM: 140 mmol/L (ref 135–145)

## 2017-04-04 LAB — PROTIME-INR: PROTIME: 28.2 s — ABNORMAL HIGH (ref 10.2–12.8)

## 2017-04-04 LAB — CBC W/ AUTO DIFF
EOSINOPHILS ABSOLUTE COUNT: 0 10*9/L (ref 0.0–0.4)
HEMATOCRIT: 31.7 % — ABNORMAL LOW (ref 41.0–53.0)
HEMOGLOBIN: 11 g/dL — ABNORMAL LOW (ref 13.5–17.5)
LARGE UNSTAINED CELLS: 3 % (ref 0–4)
LYMPHOCYTES ABSOLUTE COUNT: 1 10*9/L — ABNORMAL LOW (ref 1.5–5.0)
MEAN CORPUSCULAR HEMOGLOBIN CONC: 34.7 g/dL (ref 31.0–37.0)
MEAN CORPUSCULAR VOLUME: 92.8 fL (ref 80.0–100.0)
MONOCYTES ABSOLUTE COUNT: 0.3 10*9/L (ref 0.2–0.8)
NEUTROPHILS ABSOLUTE COUNT: 3.4 10*9/L (ref 2.0–7.5)
PLATELET COUNT: 171 10*9/L (ref 150–440)
RED BLOOD CELL COUNT: 3.42 10*12/L — ABNORMAL LOW (ref 4.50–5.90)
RED CELL DISTRIBUTION WIDTH: 15.5 % — ABNORMAL HIGH (ref 12.0–15.0)
WBC ADJUSTED: 4.9 10*9/L (ref 4.5–11.0)

## 2017-04-04 LAB — MAGNESIUM: Magnesium:MCnc:Pt:Ser/Plas:Qn:: 1.7

## 2017-04-04 LAB — INR: Lab: 2.47

## 2017-04-04 LAB — GAMMA GLUTAMYL TRANSFERASE: Gamma glutamyl transferase:CCnc:Pt:Ser/Plas:Qn:: 355 — ABNORMAL HIGH

## 2017-04-04 LAB — BILIRUBIN TOTAL: Bilirubin:MCnc:Pt:Ser/Plas:Qn:: 0.6

## 2017-04-04 LAB — BILIRUBIN, DIRECT: BILIRUBIN DIRECT: <0.1 mg/dL (ref 0.00–0.40)

## 2017-04-04 LAB — PHOSPHORUS: Phosphate:MCnc:Pt:Ser/Plas:Qn:: 3.1

## 2017-04-04 LAB — BILIRUBIN DIRECT: Bilirubin.glucuronidated:MCnc:Pt:Ser/Plas:Qn:: <0.1

## 2017-04-04 LAB — MEAN CORPUSCULAR HEMOGLOBIN: Lab: 32.2

## 2017-04-04 LAB — CYCLOSPORINE, TROUGH: Lab: 141

## 2017-04-05 MED FILL — NOVOLOG FLEXPEN(BOX)/100UNIT/ML/INJ: NOVOLOG FLEXPEN(BOX)/100UNIT/ML/INJ | 25 days supply | Qty: 1 | Fill #0

## 2017-04-05 MED FILL — BASAGLAR KWIKPEN/100UNIT/SOPN: BASAGLAR KWIKPEN/100UNIT/SOPN | 125 days supply | Qty: 1 | Fill #0

## 2017-04-05 MED FILL — DIGOXIN/0.125MG/TABS: DIGOXIN/0.125MG/TABS | 30 days supply | Qty: 15 | Fill #3

## 2017-04-05 NOTE — Unmapped (Signed)
Spoke with NP Daphine Deutscher about patient labs specifically the LFTs being slightly up but within patient normal trend and his CSA a little low at 141 with the recent increase on 03/24/17.  Stated we will see what his routine lab repeat looks like next week.

## 2017-04-05 NOTE — Unmapped (Signed)
[facility] ALLIED HEALTH PT [facility]  OUTPATIENT PHYSICAL THERAPY  Treatment Note  04/04/2017    Patient Name: Jeffrey Ward  Date of Birth:20-Aug-1950  Session Number: 7  Diagnosis:   Encounter Diagnoses   Name Primary?   ??? Weakness of both lower extremities Yes   ??? Impaired functional mobility, balance, gait, and endurance    ??? Other polyneuropathy    ??? 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 Past medical history also significant for cancer, peripheral neuropathy, hepatitis C, and heart failure. Pt demonstrated good gait mechanics and walking speed today, and was able to walk >300' using SPC without difficulty. He also did well with obstacle course negotiation without an AD. Pt needs skilled PT services to address B LE weakness and defictis in balance, gait, and endurance in order to promote safe participation in home and community activtiies.        Short Term Goals:       Short Term Goal 1: Pt will demonstrate independence in performance of HEP x 1 in clinic to maximize benefits of PT. GOAL MET (02/17/17)  Time Frame : 4 weeks   Short Term Goal 2: Pt will perform 5x STS with UE support in < 17 seconds, indicating improvement in functional mobility. GOAL MET (02/07/17)   Time Frame : 4 weeks   Short Term Goal 3: Pt will perform tandem stance > 10 seconds, indicating improved balance and reduced risk for falls GOAL MET (02/07/17)   Time Frame : 4 weeks                          Long Term Goals:  Long Term Goal 1: Pt will demonstrate strength gain of +1 per MMT for every LE muscle graded < 5/5, indicating improved strength for functional mobility   Time Frame: 10 weeks   Long Term Goal 2: Pt will be able to stand up from a standard chair 5 times consecutively without use of UE support, indicating improvement in functional lower extremity muscle strength. GOAL MET (02/07/17) Revised Goal: Pt will demonstrate performance of 5x STS in < 12 sec, indicating improved functional mobility and reduced risk for falls.   Time Frame: 10 weeks   Long Term Goal 3: Pt will perform TUG in < 12 seconds, indicating reduced risk for falls. GOAL MET (03/28/17)   Time Frame: 10 weeks   Long Term Goal 4: (NEW GOAL added 02/24/17) Pt will be able to maintain SLS for at least 6.5 sec on each LE in order to demonstrate decrease in risk for falls.   Time Frame: 10 weeks               PLAN:  2x week   for Duration: 10 weeks    Next Visit Plan: continue to progress SLS activities, get time for 5xSTS, get gait speed for ambulation outdoors with SPC    SUBJECTIVE:  Subjective : Pt reports that he has been doing fairly well, but has not been walking much. He  hasn't been getting outside because of the cold weather. Pt denies having had any falls since last visit.  Precautions: Cancer history, Fall risk       OBJECTIVE:      Pain: Yes  Location: low back  Current Pain Level: 5      Pain Frequency: Constant/continuous  Pain aggravated by: walking, sitting, lying                Additional Pain Comments: Pt also reports R knee pain, 3-4 out of 10 in severity.    Vital Signs:  Sitting: BP = 144/69 mmHg, HR = 68 bpm, O2 = 96%  On 02/07/17: Sitting after 15 minutes of activity = 137/116 mmHg, HR = 82 bpm (69 bpm at rest), 98% O2  Pt demonstrated SOB and fatigue during functional mobility, O2 Sat remained > 98% throughout session  On 03/28/17: Sitting after STS exercise = 135/88, HR = 93 bpm, O2 sat = 99%  ??  Special Cerebellar Tests:   F-N-F: NT  RAM (ankle DF): WNLs, symmetrical  H-S: NT  ??  Visual/Vestibular Testing: not indicated ??  Cognitive Status/Communication: Patient is alert, oriented, and cooperative, and able to provide historical information. He has a weak, breathy voice, but his speech is easily intelligible.   ??  Posture/Observations:   Sitting: rounded shoulders and forward head, pt demonstrated sacral sitting  Standing: forward flexed posture, rounded shoulders and forward head  Skin assessment/Edema: dry skin, good color and warmth  ??  ??  Sensation:   5.07 Semmes-Weinstein Monofilament: diminished bilateral LE  Impaired  R foot = 7/10, R LE (ankle to knee) = 7/10  L foot = 7/10, L LE (ankle to knee) = 7/10  Pt reports diminished sensation L > R   ??  Proprioception:  Intact at the great toe bilaterally, with 9 out of 10 correct responses on the L and 10 out of 10 correct responses on the R.   ??  Reflexes:   Not Tested  ??  Range of Motion/Flexibility:   Within normal limits   ??  Strength/MMT:   UE muscle strength is generally in the 4/5 range   ??  LE MMT   ??  LE MMT  Left  Right    Hip flex: (L2)  3+/5  4/5    Hip abd:  3-/5  3/5    Hip ext: -/5 -/5   Knee ext: (L3)  3+/5  4/5    Knee flex: (S2)  4/5  4/5    Ankle DF: (L4)  3+/5  3+/5    Ankle PF: (S1)  2+/5  2+/5    ??  Functional Mobility:  Pt is independent in all bed mobility and transfers, but performs them slowly. Pt is unable to come to standing from standard chair independently without use of UEs for support.  Rolling: independent  Supine to sit: independent  Sit to supine: independent  Sit to stand: modified independent with use of bilateral UE support  ??  Balance:   Patient able to maintain balance in sitting and in typical stance position independently without UE support. Able to maintain feet together and semi-tandem stance positions for >10 sec without UE support.  ??  Gait Analysis:  decreased gait speed  decreased R step length  decreased R toe clearance   Pt demonstrated decreased bilateral push-off, heel strike R > L, and step length  Patient ambulates with rollator   ??  Functional Test/Outcome Measures:  5XSTS:  unable; pt completed modified testing with 5 stands in 20.9 sec using B UE support; on 02/07/17, 15.6 sec without UE support; on 02/17/17, best time was 17.2 sec without UE support; on 02/24/17, best time was 15.0 sec without UE support; on 03/28/17, best time = 13.4 sec without UE support    ??  TUG: 18.6 sec with rollator; on 02/17/17 = 12.7 sec with rollator, 13.6 sec without an AD; on 03/28/17 = 9.6 sec with SPC   ??  Tandem Stance: 1-2 sec with L foot forward; 4.9 sec with R foot forward, on 02/07/17: L foot forward = 14.2 sec, R foot forward = 27 sec; on 03/28/17: L foot forward = 15.3 sec, R foot forward = 20.7 sec  ??  SLS: unable; on 02/07/17: L foot = 1-2 sec, R foot = 4.6 sec; on 04/04/17: L foot = 5.4 sec, R foot = 1-2 sec  ??  Gait speed on 02/07/17: 0.71 m/s, pt performed with rollator on flat, even surface across 20' with supervision; on 02/14/17 = 0.73 m/sec average over ~300'    Education Provided: safety recommendations  Education results: Verbalized understanding, demonstrated understanding       Total Time: 55    Today's Charges:  Treatment Rendered: 55 min  Therapeutic Exercise: 15 min (1 unit)  - STS 2x5 with CGA and without UE support; pt performed well  - Unilateral ankle DF in step stance position without UE support, 1x20 bilaterally, with CGA; mild balance difficulty during this exercise todayl  - Marching in place in standing, 1x6 and 1x10, with CGA; pt had difficulty performing slowly, with control  - Shallow knee bends in standing with stability ball behind back (against wall), 1x10, with supervision to CGA    Neuromuscular Re-education: 30 min (2 units)  - Attempting to maintain balance with narrow BOS (SLS) without UE support with CGA, ~3 reps bilaterally; SLS continues to be very challenging activity for pt, with greater difficulty standing on R LE today (pt having R knee pain)  - Maintaining balance while standing on one foot and tapping targets (collapsible cones) arranged in a semicircle on the floor with other foot, multiple reps with CGA; pt reported R knee pain when tapping with L foot during this activity today   - Maintaining balance while standing on firm surface with feet together and throwing/catching small weighted ball using rebounder, 3x10, with CGA; challenging activity for pt, but generally performed well  - Maintaining balance while side stepping and walking forward on foam balance beam without UE support, multiple reps, with CG to occasional min A; challenging activity for pt  - Negotiating obstacle course which included stepping on to and off of foam pad, stepping over four 9-high hurdles, walking length of foam balance beam, stepping on to 6-high step with small incline leading up to or down from it (depending on walking direction), stepping on to and off of 8-high step, and stepping on one side of tilt board and taking small steps forward until board tipped to other side, multiple reps with CGA; challenging activity for pt, but generally performed well    Gait Training: 10 min (1 unit)  -Walking >300' outdoors on paved level surfaces and up/down curb using SPC with CGA; pt demonstrated good gait mechanics and walking speed during this activity today, with no instances of LOB                    I attest that I have reviewed  the above information.  Signed: Sedalia Muta, PT  04/04/2017 6:19 PM

## 2017-04-06 NOTE — Unmapped (Signed)
[facility] Refill Coordination Note  Specialty Medication(s): Mycophenolate 250mg      Jeffrey Ward, DOB: [birth year]  Phone: [phone] (home) , Alternate phone contact: N/A  Phone or address changes today?: No  All above HIPAA information was verified with patient's family member.  Shipping Address: [address]  [place] [address]   Insurance changes? No    Completed refill call assessment today to schedule patient's medication shipment from the [facility] [phone]).      Confirmed the medication and dosage are correct and have not changed: Yes, regimen is correct and unchanged.    Confirmed patient started or stopped the following medications in the past month:  No, there are no changes reported at this time.    Are you tolerating your medication?:  [name] reports tolerating the medication.    ADHERENCE    (Below is required for Medicare Part B or Transplant patients only - per drug):   How many tablets were dispensed last month:     Mycophenolate Mofetil 250 mg   Quantity filled last month: 120   # of tablets left on hand: 7 days    Did you miss any doses in the past 4 weeks? No missed doses reported.    FINANCIAL/SHIPPING    Delivery Scheduled: Yes, Expected medication delivery date: 04/13/2017     [facility] did not have any additional questions at this time.    Delivery address validated in [facility] scheduling system: Yes, address listed in [facility] is correct.    We will follow up with patient monthly for standard refill processing and delivery.      Thank you,  Tamala Fothergill   [facility] Specialty Technician

## 2017-04-10 ENCOUNTER — Ambulatory Visit: Admit: 2017-04-10 | Discharge: 2017-04-11 | Payer: MEDICARE

## 2017-04-10 DIAGNOSIS — Z79899 Other long term (current) drug therapy: Secondary | ICD-10-CM

## 2017-04-10 DIAGNOSIS — R7989 Other specified abnormal findings of blood chemistry: Principal | ICD-10-CM

## 2017-04-10 DIAGNOSIS — Z5181 Encounter for therapeutic drug level monitoring: Secondary | ICD-10-CM

## 2017-04-10 DIAGNOSIS — Z7901 Long term (current) use of anticoagulants: Secondary | ICD-10-CM

## 2017-04-10 DIAGNOSIS — K769 Liver disease, unspecified: Secondary | ICD-10-CM

## 2017-04-10 DIAGNOSIS — Z944 Liver transplant status: Secondary | ICD-10-CM

## 2017-04-10 DIAGNOSIS — I4891 Unspecified atrial fibrillation: Principal | ICD-10-CM

## 2017-04-10 DIAGNOSIS — E079 Disorder of thyroid, unspecified: Secondary | ICD-10-CM

## 2017-04-10 LAB — CBC W/ AUTO DIFF
EOSINOPHILS ABSOLUTE COUNT: 0 10*9/L (ref 0.0–0.4)
HEMATOCRIT: 31.6 % — ABNORMAL LOW (ref 41.0–53.0)
HEMOGLOBIN: 10.5 g/dL — ABNORMAL LOW (ref 13.5–17.5)
LARGE UNSTAINED CELLS: 1 % (ref 0–4)
LYMPHOCYTES ABSOLUTE COUNT: 0.5 10*9/L — ABNORMAL LOW (ref 1.5–5.0)
MEAN CORPUSCULAR HEMOGLOBIN CONC: 33.3 g/dL (ref 31.0–37.0)
MEAN CORPUSCULAR HEMOGLOBIN: 30.3 pg (ref 26.0–34.0)
MEAN CORPUSCULAR VOLUME: 90.9 fL (ref 80.0–100.0)
MEAN PLATELET VOLUME: 8.6 fL (ref 7.0–10.0)
MONOCYTES ABSOLUTE COUNT: 0.5 10*9/L (ref 0.2–0.8)
NEUTROPHILS ABSOLUTE COUNT: 7.7 10*9/L — ABNORMAL HIGH (ref 2.0–7.5)
PLATELET COUNT: 195 10*9/L (ref 150–440)
RED BLOOD CELL COUNT: 3.47 10*12/L — ABNORMAL LOW (ref 4.50–5.90)
RED CELL DISTRIBUTION WIDTH: 15 % (ref 12.0–15.0)
WBC ADJUSTED: 8.8 10*9/L (ref 4.5–11.0)

## 2017-04-10 LAB — GAMMA GLUTAMYL TRANSFERASE: Gamma glutamyl transferase:CCnc:Pt:Ser/Plas:Qn:: 262 — ABNORMAL HIGH

## 2017-04-10 LAB — COMPREHENSIVE METABOLIC PANEL
ALBUMIN: 3.7 g/dL (ref 3.5–5.0)
ALKALINE PHOSPHATASE: 292 U/L — ABNORMAL HIGH (ref 38–126)
ALT (SGPT): 26 U/L (ref 19–72)
ANION GAP: 8 mmol/L — ABNORMAL LOW (ref 9–15)
AST (SGOT): 18 U/L — ABNORMAL LOW (ref 19–55)
BILIRUBIN TOTAL: 0.6 mg/dL (ref 0.0–1.2)
BLOOD UREA NITROGEN: 30 mg/dL — ABNORMAL HIGH (ref 7–21)
BUN / CREAT RATIO: 22
CALCIUM: 10.2 mg/dL (ref 8.5–10.2)
CHLORIDE: 105 mmol/L (ref 98–107)
CO2: 23 mmol/L (ref 22.0–30.0)
CREATININE: 1.37 mg/dL — ABNORMAL HIGH (ref 0.70–1.30)
EGFR MDRD AF AMER: >=60 mL/min/{1.73_m2} (ref >=60)
EGFR MDRD NON AF AMER: 52 mL/min/{1.73_m2} — ABNORMAL LOW (ref >=60)
GLUCOSE RANDOM: 141 mg/dL — ABNORMAL HIGH (ref 65–99)
PROTEIN TOTAL: 6.4 g/dL — ABNORMAL LOW (ref 6.5–8.3)
SODIUM: 136 mmol/L (ref 135–145)

## 2017-04-10 LAB — PHOSPHORUS: Phosphate:MCnc:Pt:Ser/Plas:Qn:: 2.9

## 2017-04-10 LAB — MEAN CORPUSCULAR HEMOGLOBIN: Lab: 30.3

## 2017-04-10 LAB — THYROID STIMULATING HORMONE: Thyrotropin:ACnc:Pt:Ser/Plas:Qn:: 1.97

## 2017-04-10 LAB — MAGNESIUM: Magnesium:MCnc:Pt:Ser/Plas:Qn:: 1.4 — ABNORMAL LOW

## 2017-04-10 LAB — CREATININE: Creatinine:MCnc:Pt:Ser/Plas:Qn:: 1.37 — ABNORMAL HIGH

## 2017-04-10 LAB — T4, FREE: FREE T4: 1.77 ng/dL — ABNORMAL HIGH (ref 0.71–1.40)

## 2017-04-10 LAB — INR: Lab: 2.67

## 2017-04-10 LAB — PROTIME-INR: PROTIME: 30.4 s — ABNORMAL HIGH (ref 10.2–12.8)

## 2017-04-10 LAB — CYCLOSPORINE, TROUGH: Lab: 161

## 2017-04-10 LAB — BILIRUBIN DIRECT: Bilirubin.glucuronidated:MCnc:Pt:Ser/Plas:Qn:: 0.4

## 2017-04-10 LAB — FREE T4: Thyroxine.free:MCnc:Pt:Ser/Plas:Qn:: 1.77 — ABNORMAL HIGH

## 2017-04-10 NOTE — Unmapped (Signed)
[facility] Physical Medicine and Rehabilitation   Follow-Up Note    Patient Name:Senai Canal  MRN: [id number]  DOB: [birth year]  Age: 67 y.o.   Date: 04/11/2017  Attending Physician: Idelle Crouch, MD    ASSESSMENT:     Pt is a 67 y.o.  male with debility and gait instability.  He is functionally independent.    PLAN:   The following instructions were given to the patient:    Recovering well. Independent from a functional standpoint    Rehab:  -Continue with therapies.  -Continue to do home exercise program.    Follow up with PM&R as needed.      - Return if symptoms worsen or fail to improve.      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 Post-op course complicated by decompensation thought to be secondary to acute right heart failure and AKI requiring CRRT (resolved). 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 Severe PAH diagnosed. Eventually had episodes of a. Fib/tachyarrythmia x3, converted with amiodarone, then with digoxin; Cardiology following closely. On 11/03/16 he had a significant spike in his LFTs suspicious for acute rejection; however liver biopsy from 11/04/16 did not show any evidence of rejection. He underwent G tube on 11/17/2016. ??Hep gtt restarted for bridging to coumadin. Developed GIB on 9/9 and underwent intubation for EGD. Small ulcer at GEJ was injected with epinephrine and clipped. The patient participated in acute inpatient rehab from 11/24/16 - 12/10/16. He was discharged with Home health PT/OT/SLP. Today, he's having low back pain, left leg pain and and B/L feet numbness.  He has a rollator and no AFO left leg weakness.  He did have an EMG on 12/26/16.    Updates:   -will f/u with ENT about throat. He's seen the voice clinic, and will have a procedure this Spring.  -He got the Flu, so he was hospitalized. He's recovering from that. He's following up with transplant soon.   -He was walking without the cane before being hospitalized, but he's using now. The patient is getting outpatient PT in [place].   -Tried the gabapentin, but couldn't get up out of bed. He wanted to see Transplant pharmacist and MD about consideration of Lyrica, but not in a rush too for his pain.   -His pain in feet are 4/10 and 5/10 low back pain.      -Primary care Healthcare screenings and updates: Sees transplant. He'll follow up with PCP is December with Dr. Graciela Husbands.    History    The following portions of the patient's history were reviewed in the electronic medical record and updated as appropriate:   Allergies  Current medications  Past medical history  Past surgical history  Past social history  Past family history  Problem list    Medical / Surgical History:  Reviewed  Past Medical History:   Diagnosis Date   ??? Cancer (CMS-HCC)    ??? Cirrhosis (CMS-HCC)    ??? Diabetes (CMS-HCC)    ??? Hepatitis C 07/17/2012   ??? Liver disease    ??? Low back pain 07/17/2012   ??? Varices, esophageal (CMS-HCC)        Past Surgical History:   Procedure Laterality Date   ??? ANKLE SURGERY     ???  BACK SURGERY     ??? BACK SURGERY     ??? CHG US GUIDE, TISSUE ABLATION N/A 01/22/2016    Procedure: ULTRASOUND GUIDANCE FOR, AND MONITORING OF, PARENCHYMAL TISSUE ABLATION;  Surgeon: Particia Nearing, MD;  Location: MAIN OR [facility];  Service: Transplant   ??? IR EMBOLIZATION ORGAN ISCHEMIA, TUMORS, INFAR  06/16/2016    IR EMBOLIZATION ORGAN ISCHEMIA, TUMORS, INFAR 06/16/2016 Ammie Dalton, MD IMG VIR H&V [facility]   ??? PR COLON CA SCRN NOT HI RSK IND N/A 02/27/2015    Procedure: COLOREC CNCR SCR;COLNSCPY NO;  Surgeon: Vonda Antigua, MD;  Location: GI PROCEDURES [facility];  Service: Gastroenterology   ??? PR ENDOSCOPIC ULTRASOUND EXAM N/A 02/27/2015    Procedure: UGI ENDO; W/ENDO ULTRASOUND EXAM INCLUDES ESOPHAGUS, STOMACH, &/OR DUODENUM/JEJUNUM;  Surgeon: Vonda Antigua, MD;  Location: GI PROCEDURES MEMORIAL [facility];  Service: Gastroenterology   ??? PR ERCP BALLOON DILATE BILIARY/PANC DUCT/AMPULLA EA N/A 02/10/2017    Procedure: ERCP;WITH TRANS-ENDOSCOPIC BALLOON DILATION OF BILIARY/PANCREATIC DUCT(S) OR OF AMPULLA, INCLUDING SPHINCTERECTOMY, WHEN PERFOREMD,EACH DUCT (96045);  Surgeon: Mayford Knife, MD;  Location: GI PROCEDURES [facility];  Service: Gastroenterology   ??? PR ERCP REMOVE FOREIGN BODY/STENT BILIARY/PANC DUCT N/A 02/10/2017    Procedure: ENDOSCOPIC RETROGRADE CHOLANGIOPANCREATOGRAPHY (ERCP); W/ REMOVAL OF FOREIGN BODY/STENT FROM BILIARY/PANCREATIC DUCT(S);  Surgeon: Mayford Knife, MD;  Location: GI PROCEDURES [facility]IAL [facility];  Service: Gastroenterology   ??? PR ERCP STENT PLACEMENT BILIARY/PANCREATIC DUCT N/A 11/29/2016    Procedure: ENDOSCOPIC RETROGRADE CHOLANGIOPANCREATOGRAPHY (ERCP); WITH PLACEMENT OF ENDOSCOPIC STENT INTO BILIARY OR PANCREATIC DUCT;  Surgeon: Chriss Driver, MD;  Location: GI PROCEDURES [facility]ORIAL [facility];  Service: Gastroenterology   ??? PR ERCP,W/REMOVAL STONE,BIL/PANCR DUCTS N/A 11/29/2016    Procedure: ERCP; W/ENDOSCOPIC RETROGRADE REMOVAL OF CALCULUS/CALCULI FROM BILIARY &/OR PANCREATIC DUCTS;  Surgeon: Chriss Driver, MD;  Location: GI PROCEDURES [facility];  Service: Gastroenterology   ??? PR ERCP,W/REMOVAL STONE,BIL/PANCR DUCTS N/A 02/10/2017    Procedure: ERCP; W/ENDOSCOPIC RETROGRADE REMOVAL OF CALCULUS/CALCULI FROM BILIARY &/OR PANCREATIC DUCTS;  Surgeon: Mayford Knife, MD;  Location: GI PROCEDURES [facility];  Service: Gastroenterology   ??? PR INSER HEART TEMP PACER ONE CHMBR N/A 10/02/2016    Procedure: Tempoarary Pacemaker Insertion;  Surgeon: Meredith Leeds, MD;  Location: [facility];  Service: Cardiology   ??? PR LAP,DIAGNOSTIC ABDOMEN N/A 01/22/2016    Procedure: Laparoscopy, Abdomen, Peritoneum, & Omentum, Diagnostic, W/Wo Collection Specimen(S) By Brushing Or Washing;  Surgeon: Particia Nearing, MD;  Location: MAIN OR [facility];  Service: Transplant   ??? PR PLACE PERCUT GASTROSTOMY TUBE N/A 11/17/2016    Procedure: UGI ENDO; W/DIRECTED PLCMT PERQ GASTROSTOMY TUBE;  Surgeon: Cletis Athens, MD;  Location: GI PROCEDURES [facility];  Service: Gastroenterology   ??? PR TRACHEOSTOMY, PLANNED N/A 09/29/2016    Procedure: TRACHEOSTOMY PLANNED (SEPART PROC);  Surgeon: Katherina Mires, MD;  Location: MAIN OR [facility];  Service: Trauma   ??? PR TRANSCATH INSERT OR REPLACE LEADLESS PM VENTR N/A 10/11/2016    Procedure: Pacemaker Implant/Replace Leadless;  Surgeon: Meredith Leeds, MD;  Location: [facility];  Service: Cardiology   ??? PR TRANSPLANT LIVER,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    Procedure: Liver Allotransplantation; Orthotopic, Partial Or Whole, From Cadaver Or Living Donor, Any Age;  Surgeon: Doyce Loose, MD;  Location: MAIN OR [facility];  Service: Transplant   ??? PR TRANSPLANT,PREP DONOR LIVER, WHOLE N/A 09/15/2016    Procedure: Rogelia Boga Std Prep Cad Donor Whole Liver Gft Prior Tnsplnt,Inc Chole,Diss/Rem Surr Tissu Wo Triseg/Lobe  Splt;  Surgeon: Doyce Loose, MD;  Location: MAIN OR [facility];  Service: Transplant   ??? PR UPPER GI ENDOSCOPY,BIOPSY N/A 07/17/2012    Procedure: UGI ENDOSCOPY; WITH BIOPSY, SINGLE OR MULTIPLE;  Surgeon: Alba Destine, MD;  Location: GI PROCEDURES [facility];  Service: Gastroenterology   ??? PR UPPER GI ENDOSCOPY,DIAGNOSIS N/A 02/04/2014    Procedure: UGI ENDO, INCLUDE ESOPHAGUS, STOMACH, & DUODENUM &/OR JEJUNUM; DX W/WO COLLECTION SPECIMN, BY BRUSH OR WASH;  Surgeon: Wilburt Finlay, MD;  Location: GI PROCEDURES [facility];  Service: Gastroenterology   ??? PR UPPER GI ENDOSCOPY,LIGAT VARIX N/A 11/05/2013    Procedure: UGI ENDO; W/BAND LIG ESOPH &/OR GASTRIC VARICES;  Surgeon: Wilburt Finlay, MD;  Location: GI PROCEDURES [facility];  Service: Gastroenterology       Social History: Reviewed   reports that he has never smoked. He has never used smokeless tobacco. He reports that he does not drink alcohol or use drugs.    -Marital status: Married.    Functional Status: independent to mod I:  *Dressing : i  *Bathing: I  *Toileting: I  *Ambulation: mod I with cane  *Transfers (moving from bed to chair, chair to toilet, etc.): mod I.    -Braces or orthotic devices: none.  -Wheelchair, cane or walker usage: cane .    -Current Therapies: outpatient PT now.      Family History:  Reviewed  family history includes Hypertension in his mother.    Allergies: Reviewed  Patient has no known allergies.    Medications: Reviewed  Current Outpatient Prescriptions   Medication Sig Dispense Refill   ??? acetaminophen (TYLENOL) 325 MG tablet Take 1 tablet (325 mg total) by mouth every four (4) hours as needed for pain. 100 tablet 2   ??? blood sugar diagnostic (FREESTYLE TEST) Strp by Other route Four (4) times a day (before meals and nightly). 100 each 11   ??? blood-glucose meter (GLUCOSE MONITORING KIT) kit Use as instructed 1 each 0   ??? capsaicin (ZOSTRIX) 0.025 % cream Apply topically Two (2) times a day. 60 g 0   ??? cholecalciferol, vitamin D3, 2,000 unit cap 1 capsule (2,000 Units total) by PEG Tube route daily. (Patient taking differently: Take 2,000 Units by mouth daily. ) 30 capsule 11   ??? digoxin (LANOXIN) 125 mcg tablet 0.5 tablets (62.5 mcg total) by G-tube route daily. 15 tablet 11   ??? insulin ASPART (NOVOLOG FLEXPEN U-100 INSULIN) 100 unit/mL injection pen Inject 0.06 mL (6 Units total) under the skin Three (3) times a day before meals. 4 mL 11   ??? insulin glargine (LANTUS) 100 unit/mL (3 mL) injection pen Inject 0.12 mL (12 Units total) under the skin nightly. 4 mL 11   ??? lancets Misc 1 each by Miscellaneous route Four (4) times a day (before meals and nightly). 100 each 11   ??? MAGNESIUM, AMINO ACID CHELATE, 133 mg tablet TAKE 1 TABLET BY MOUTH TWICE DAILY 100 tablet PRN   ??? melatonin 3 mg Tab Take 1 tablet (3 mg total) by mouth nightly. 30 tablet 0   ??? mycophenolate (CELLCEPT) 250 mg capsule Take 2 capsules (500 mg total) by mouth Two (2) times a day. 360 capsule 3   ??? NEORAL 100 mg capsule Take 2 capsules (200 mg total) by mouth Two (2) times a day. 120 capsule 11   ??? omeprazole (PRILOSEC) 20 MG capsule Take 1 capsule (20 mg total) by mouth daily. 30 capsule 1   ???  pen needle, diabetic (ULTICARE PEN NEEDLE) 32 gauge x 5/32 Ndle 1 each by Miscellaneous route Four (4) times a day (before meals and nightly). 90 each 3   ??? traMADol (ULTRAM) 50 mg tablet Take 50 mg by mouth. Every 4 to 6 hours PRN     ??? warfarin (COUMADIN) 3 MG tablet Take 3 mg by mouth nightly.      ??? insulin ASPART (NOVOLOG FLEXPEN) 100 unit/mL injection pen Additional mealtime per [facility]-  151-200= 2 units,  201-250= 4 units, 251-300=add 6 units, 301-350=8 units, 351-400=10 units, >400 =12 units (Patient not taking: Reported on 04/11/2017) 1.8 mL 0     Current Facility-Administered Medications   Medication Dose Route Frequency Provider Last Rate Last Dose   ??? pregabalin (LYRICA) capsule 50 mg  50 mg Oral Nightly Oran Rein, MD                            Review of Systems:   10 organ systems reviewed and pertinent as noted in HPI.        OBJECTIVE:     BP 157/79 (BP Site: L Arm, BP Position: Sitting, BP Cuff Size: Large)  - Pulse 78  - Ht 172.7 cm (5' 8)  - Wt 61.6 kg (135 lb 12.9 oz)  - BMI 20.65 kg/m??       Physical Exam:   Gen: NAD  HENT: Oral pharynx is moist. No signs of erythema. No nystagmus  CVS: RRR. Normal pulses. Good perfusion of limbs.  Lungs: CTAB. Nonlabored respirations.  Abd: Normal active BS. Nondistended.  Ext: No cyanosis.  MSK: No deformities or tenderness to palpation of joints. Good ROM.  Neuro:  CN 2-12 grossly intact  Strength is 5/5 throughout B/L UE and LE  Sensation: slightly decreased to light touch of lower legs.  Tone: WNL  Gait within normal limits using rollator.   Diagnostic Studies:   ??      Claiborne Billings Lipscomb-Hudson, MD  04/11/2017 3:58 PM

## 2017-04-10 NOTE — Unmapped (Signed)
CIED Interrogation     INDICATION FOR IMPLANT: Sinoatrial Node Dysfunction    Device: Medtronic Micra VR Pacemaker  SN#: [id number]  Implant Date: 10/11/2016  Settings: vvir  Lower Rate/Upper Rate: 50/120  Battery: >8 YRS  Presenting Rhythm: vs 80'S  Underlying Rhythm: VS 80's    Percent Pacing  VP: 9.7%  Sensing  RV: 16.1 mV  Impedances  RV: 610 ohms  Threshold  RV: 0.5 V @ 0.24 ms    Events: None    Changes: None    PLAN:?? Continue remotes. Return in 1 year.

## 2017-04-10 NOTE — Unmapped (Signed)
TRF

## 2017-04-10 NOTE — Unmapped (Signed)
Addended by: Jamelle Rushing E on: 04/09/2017 05:26 PM     Modules accepted: Level of Service

## 2017-04-11 ENCOUNTER — Ambulatory Visit
Admit: 2017-04-11 | Discharge: 2017-04-12 | Payer: MEDICARE | Attending: Physical Medicine & Rehabilitation | Primary: Physical Medicine & Rehabilitation

## 2017-04-11 DIAGNOSIS — R5381 Other malaise: Principal | ICD-10-CM

## 2017-04-11 DIAGNOSIS — G6289 Other specified polyneuropathies: Secondary | ICD-10-CM

## 2017-04-11 DIAGNOSIS — Z9181 History of falling: Secondary | ICD-10-CM

## 2017-04-11 DIAGNOSIS — Z7409 Other reduced mobility: Secondary | ICD-10-CM

## 2017-04-11 DIAGNOSIS — R2681 Unsteadiness on feet: Secondary | ICD-10-CM

## 2017-04-11 DIAGNOSIS — R29898 Other symptoms and signs involving the musculoskeletal system: Principal | ICD-10-CM

## 2017-04-11 DIAGNOSIS — M5442 Lumbago with sciatica, left side: Secondary | ICD-10-CM

## 2017-04-11 NOTE — Unmapped (Signed)
Per NP Martin-Velez patient to decrease lab frequency to every other week.  Wife verbalized understanding.

## 2017-04-11 NOTE — Unmapped (Signed)
You are recovering well. You are independent from a functional standpoint    Rehab:  -Continue with your therapies.  -Continue to do your home exercise program.    Follow up with PM&R as needed.

## 2017-04-11 NOTE — Unmapped (Signed)
Date of consultation: 04/11/2017    Referring physician: Particia Nearing, MD    Primary care physician: [name], MD      Cardiac Implantable Electronic Device Follow-up Visit     Assessment and Plan:      Jeffrey Ward is a 67 y.o. 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  His amiodarone was stopped on previous follow-up and he has remained in sinus rhythm since.    He presents for routine cardiac device check.  Interrogation of the Medtronic leadless pacemaker demonstrates satisfactory function.  There have been no significant arrhythmia.  He is being paced 9.7% of times  The patient is not pacing dependent, with an underlying rhythm of normal sinus 92/minute.    Home monitoring has been set up.  In person follow-up will be in 12 months, with 1-monthly checks using home monitoring.      All questions were answered for the patient.  Thank you very much for the referral.  Please contact me with any questions at telephone number [phone].        Daiva Huge, BSc (Hons), MBChB, MRCP, [facility]  Assistant Professor of Medicine  Clinical Cardiac Electrophysiology  [facility] [employer] [place] [place]     04/11/2017, 12:00 AM      History of Presenting Illness:     Today, we had the pleasure of seeing Jeffrey Ward in the Cardiac Electrophysiology Clinic at [facility]. As you know, he is a 67 y.o. male with a history of sinus node dysfunction manifest as intermittent sinus node arrest, who underwent a Medtronic leadless (Micra) pacemaker implantation on 10/11/2016.  The patient presents to clinic today for routine review and had no complaints pertaining to the device or arrhythmia.    Device interrogation in clinic today was normal, with normal lead parameters and device function (see scanned in report).  There have been no significant arrhythmia.      Past Medical and Surgical History:     Past Medical History:   Diagnosis Date   ??? Cancer (CMS-HCC)    ??? Cirrhosis (CMS-HCC)    ??? Diabetes (CMS-HCC)    ??? Hepatitis C 07/17/2012   ??? Liver disease    ??? Low back pain 07/17/2012   ??? Varices, esophageal (CMS-HCC)        Past Surgical History:   Procedure Laterality Date   ??? ANKLE SURGERY     ??? BACK SURGERY     ??? BACK SURGERY     ??? CHG US GUIDE, TISSUE ABLATION N/A 01/22/2016    Procedure: ULTRASOUND GUIDANCE FOR, AND MONITORING OF, PARENCHYMAL TISSUE ABLATION;  Surgeon: Particia Nearing, MD;  Location: MAIN OR [facility];  Service: Transplant   ??? IR EMBOLIZATION ORGAN ISCHEMIA, TUMORS, INFAR  06/16/2016    IR EMBOLIZATION ORGAN ISCHEMIA, TUMORS, INFAR 06/16/2016 Ammie Dalton, MD IMG VIR H&V [facility]   ??? PR COLON CA SCRN NOT HI RSK IND N/A 02/27/2015    Procedure: COLOREC CNCR SCR;COLNSCPY NO;  Surgeon: Vonda Antigua, MD;  Location: GI PROC[facility]URES [facility];  Service: Gastroenterology   ??? PR ENDOSCOPIC ULTRASOUND EXAM N/A 02/27/2015    Procedure: UGI ENDO; W/ENDO ULTRASOUND EXAM INCLUDES ESOPHAGUS, STOMACH, &/OR DUODENUM/JEJUNUM;  Surgeon: Vonda Antigua, MD;  Location: GI PROCEDURES MEMORIAL [facility];  Service: Gastroenterology   ??? PR ERCP BALLOON DILATE BILIARY/PANC DUCT/AMPULLA EA N/A 02/10/2017    Procedure: ERCP;WITH TRANS-ENDOSCOPIC BALLOON DILATION OF BILIARY/PANCREATIC DUCT(S) OR OF AMPULLA, INCLUDING SPHINCTERECTOMY, WHEN PERFOREMD,EACH DUCT ([id number]);  Surgeon: Mayford Knife, MD;  Location: GI PROCEDURES MEMORIAL [facility];  Service: Gastroenterology   ??? PR ERCP REMOVE FOREIGN BODY/STENT BILIARY/PANC DUCT N/A 02/10/2017    Procedure: ENDOSCOPIC RETROGRADE CHOLANGIOPANCREATOGRAPHY (ERCP); W/ REMOVAL OF FOREIGN BODY/STENT FROM BILIARY/PANCREATIC DUCT(S);  Surgeon: Mayford Knife, MD;  Location: GI PROCEDURES MEMORIAL [facility];  Service: Gastroenterology   ??? PR ERCP STENT PLACEMENT BILIARY/PANCREATIC DUCT N/A 11/29/2016    Procedure: ENDOSCOPIC RETROGRADE CHOLANGIOPANCREATOGRAPHY (ERCP); WITH PLACEMENT OF ENDOSCOPIC STENT INTO BILIARY OR PANCREATIC DUCT;  Surgeon: Chriss Driver, MD;  Location: GI PROCEDURES MEMORIAL [facility];  Service: Gastroenterology   ??? PR ERCP,W/REMOVAL STONE,BIL/PANCR DUCTS N/A 11/29/2016    Procedure: ERCP; W/ENDOSCOPIC RETROGRADE REMOVAL OF CALCULUS/CALCULI FROM BILIARY &/OR PANCREATIC DUCTS;  Surgeon: Chriss Driver, MD;  Location: GI PROCEDURES [facility];  Service: Gastroenterology   ??? PR ERCP,W/REMOVAL STONE,BIL/PANCR DUCTS N/A 02/10/2017    Procedure: ERCP; W/ENDOSCOPIC RETROGRADE REMOVAL OF CALCULUS/CALCULI FROM BILIARY &/OR PANCREATIC DUCTS;  Surgeon: Mayford Knife, MD;  Location: GI PROCEDURES MEMOR[facility];  Service: Gastroenterology   ??? PR INSER HEART TEMP PACER ONE CHMBR N/A 10/02/2016    Procedure: Tempoarary Pacemaker Insertion;  Surgeon: Meredith Leeds, MD;  Location: [facility];  Service: Cardiology   ??? PR LAP,DIAGNOSTIC ABDOMEN N/A 01/22/2016    Procedure: Laparoscopy, Abdomen, Peritoneum, & Omentum, Diagnostic, W/Wo Collection Specimen(S) By Brushing Or Washing;  Surgeon: Particia Nearing, MD;  Location: MAIN OR [facility];  Service: Transplant   ??? PR PLACE PERCUT GASTROSTOMY TUBE N/A 11/17/2016    Procedure: UGI ENDO; W/DIRECTED PLCMT PERQ GASTROSTOMY TUBE;  Surgeon: Cletis Athens, MD;  Location: GI PROCEDURES MEMOR[facility];  Service: Gastroenterology   ??? PR TRACHEOSTOMY, PLANNED N/A 09/29/2016    Procedure: TRACHEOSTOMY PLANNED (SEPART PROC);  Surgeon: Katherina Mires, MD;  Location: MAIN OR [facility];  Service: Trauma   ??? PR TRANSCATH INSERT OR REPLACE LEADLESS PM VENTR N/A 10/11/2016    Procedure: Pacemaker Implant/Replace Leadless;  Surgeon: Meredith Leeds, MD;  Location: [facility] EP;  Service: Cardiology   ??? PR TRANSPLANT LIVER,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    Procedure: Liver Allotransplantation; Orthotopic, Partial Or Whole, From Cadaver Or Living Donor, Any Age;  Surgeon: Doyce Loose, MD;  Location: MAIN OR [facility];  Service: Transplant   ??? PR TRANSPLANT,PREP DONOR LIVER, WHOLE N/A 09/15/2016    Procedure: Rogelia Boga Std Prep Cad Donor Whole Liver Gft Prior Tnsplnt,Inc Chole,Diss/Rem Surr Tissu Wo Triseg/Lobe Splt;  Surgeon: Doyce Loose, MD;  Location: MAIN OR [facility];  Service: Transplant   ??? PR UPPER GI ENDOSCOPY,BIOPSY N/A 07/17/2012    Procedure: UGI ENDOSCOPY; WITH BIOPSY, SINGLE OR MULTIPLE;  Surgeon: Alba Destine, MD;  Location: GI PROCEDURES [facility]OR[facility];  Service: Gastroenterology   ??? PR UPPER GI ENDOSCOPY,DIAGNOSIS N/A 02/04/2014    Procedure: UGI ENDO, INCLUDE ESOPHAGUS, STOMACH, & DUODENUM &/OR JEJUNUM; DX W/WO COLLECTION SPECIMN, BY BRUSH OR WASH;  Surgeon: Wilburt Finlay, MD;  Location: GI PROCEDURES [facility];  Service: Gastroenterology   ??? PR UPPER GI ENDOSCOPY,LIGAT VARIX N/A 11/05/2013    Procedure: UGI  ENDO; W/BAND LIG ESOPH &/OR GASTRIC VARICES;  Surgeon: Wilburt Finlay, MD;  Location: GI PROCEDURES [facility];  Service: Gastroenterology       Medications and Allergies:     Current Outpatient Prescriptions   Medication Sig Dispense Refill   ??? acetaminophen (TYLENOL) 325 MG tablet Take 1 tablet (325 mg total) by mouth every four (4) hours as needed for pain. 100 tablet 2   ??? cholecalciferol, vitamin D3, 2,000 unit cap 1 capsule (2,000 Units total) by PEG Tube route daily. 30 capsule 11   ??? digoxin (LANOXIN) 125 mcg tablet 0.5 tablets (62.5 mcg total) by G-tube route daily. 15 tablet 11   ??? insulin ASPART (NOVOLOG FLEXPEN U-100 INSULIN) 100 unit/mL injection pen Inject 0.06 mL (6 Units total) under the skin Three (3) times a day before meals. 4 mL 11   ??? insulin glargine (LANTUS) 100 unit/mL (3 mL) injection pen Inject 0.12 mL (12 Units total) under the skin nightly. 4 mL 11   ??? MAGNESIUM, AMINO ACID CHELATE, 133 mg tablet TAKE 1 TABLET BY MOUTH TWICE DAILY 100 tablet PRN   ??? melatonin 3 mg Tab Take 1 tablet (3 mg total) by mouth nightly. 30 tablet 0   ??? mycophenolate (CELLCEPT) 250 mg capsule Take 2 capsules (500 mg total) by mouth Two (2) times a day. 360 capsule 3   ??? NEORAL 100 mg capsule Take 2 capsules (200 mg total) by mouth Two (2) times a day. 120 capsule 11   ??? omeprazole (PRILOSEC) 20 MG capsule Take 1 capsule (20 mg total) by mouth daily. 30 capsule 1   ??? warfarin (COUMADIN) 3 MG tablet Take 3 mg by mouth nightly.      ??? blood sugar diagnostic (FREESTYLE TEST) Strp by Other route Four (4) times a day (before meals and nightly). 100 each 11   ??? blood-glucose meter (GLUCOSE MONITORING KIT) kit Use as instructed 1 each 0   ??? capsaicin (ZOSTRIX) 0.025 % cream Apply topically Two (2) times a day. (Patient not taking: Reported on 04/10/2017) 60 g 0   ??? insulin ASPART (NOVOLOG FLEXPEN) 100 unit/mL injection pen Additional mealtime per SSI-  151-200= 2 units,  201-250= 4 units, 251-300=add 6 units, 301-350=8 units, 351-400=10 units, >400 =12 units 1.8 mL 0   ??? lancets Misc 1 each by Miscellaneous route Four (4) times a day (before meals and nightly). 100 each 11   ??? pen needle, diabetic (ULTICARE PEN NEEDLE) 32 gauge x 5/32 Ndle 1 each by Miscellaneous route Four (4) times a day (before meals and nightly). 90 each 3   ??? traMADol (ULTRAM) 50 mg tablet Take 50 mg by mouth. Every 4 to 6 hours PRN       Current Facility-Administered Medications   Medication Dose Route Frequency Provider Last Rate Last Dose   ??? pregabalin (LYRICA) capsule 50 mg  50 mg Oral Nightly Oran Rein, MD           Allergies  Patient has no known allergies.    Family History:     The patient's family history includes Hypertension in his mother.    Social History:     Social History     Social History   ??? Marital status: Married     Spouse name: N/A   ??? Number of children: N/A   ??? Years of education: N/A     Occupational History   ??? Not on file.     Social History Main Topics   ???  Smoking status: Never Smoker   ??? Smokeless tobacco: Never Used      Comment: Smoked in high school for about 5 years.    ??? Alcohol use No   ??? Drug use: No   ??? Sexual activity: Yes     Partners: Female     Birth control/ protection: None     Other Topics Concern   ??? Not on file     Social History Narrative   ??? No narrative on file       Review of Systems:     All 10 systems reviewed and negative unless otherwise states in the HPI      Physical Examination:     Vitals:    04/10/17 1128   BP: 128/76   Pulse: 68   SpO2: 99%     Body mass index is 20.53 kg/m??.    General: In general the patient appears well and is seated comfortably.   Mental: alert, orientated and appropriate.  Thorax: ventilation comfortable.   Cardiovascular: regular rhythm, perfusion normal.    Neurological: no focal neurological deficit.  Extremities: no edema.      Investigations:     Lab Review   Lab Results   Component Value Date    WBC 8.8 04/10/2017    HGB 10.5 (L) 04/10/2017    HCT 31.6 (L) 04/10/2017    MCV 90.9 04/10/2017    PLT 195 04/10/2017     Lab Results   Component Value Date    BUN 30 (H) 04/10/2017    CO2 23.0 04/10/2017    CREATININE 1.37 (H) 04/10/2017    K 4.8 04/10/2017    NA 136 04/10/2017    CL 105 04/10/2017    CALCIUM 10.2 04/10/2017       Lab Results   Component Value Date    TROPONINI 0.079 (HH) 10/13/2016       Lab Results   Component Value Date    MG 1.4 (L) 04/10/2017       Lab Results   Component Value Date    PHOS 2.9 04/10/2017     Lab Results   Component Value Date    PT 30.4 (H) 04/10/2017    INR 2.67 04/10/2017     No results found for: BNP  Lab Results   Component Value Date    ALT 26 04/10/2017    AST 18 (L) 04/10/2017    GGT 262 (H) 04/10/2017    ALKPHOS 292 (H) 04/10/2017    BILITOT 0.6 04/10/2017     Lab Results   Component Value Date    TRIG 101 12/26/2016    HDL 70 (H) 12/26/2016    LDL 122 (H) 12/26/2016       Lab Results   Component Value Date    TSH 1.970 04/10/2017       12-lead ECG (personal review):  NSR 92/min, nonspecific T wave abnormality

## 2017-04-12 MED FILL — MG PLUS PROTEIN 133 MG/133 MG/TABS: MG PLUS PROTEIN 133 MG/133 MG/TABS | 50 days supply | Qty: 100 | Fill #1

## 2017-04-12 MED FILL — MYCOPHENOLATE MOFETIL/250MG/CAPS: MYCOPHENOLATE MOFETIL/250MG/CAPS | 30 days supply | Qty: 120 | Fill #1

## 2017-04-12 NOTE — Unmapped (Signed)
[facility]  OUTPATIENT PHYSICAL THERAPY     04/11/2017    Patient Name: Jeffrey Ward  Date of Birth:[birth year]  Session Number: 8  Diagnosis:   Encounter Diagnoses   Name Primary?   ??? Weakness of both lower extremities Yes   ??? Impaired functional mobility, balance, gait, and endurance    ??? Other polyneuropathy    ??? Risk for falls    ??? Midline low back pain with left-sided sciatica, unspecified chronicity                              ASSESSMENT:      Assessment: Pt is a 67 year old male being seen for deconditioning, balance/gait difficulties, and increased risk of falls following a liver transplant (July 2018). Past medical history also significant for cancer, peripheral neuropathy, hepatitis C, and heart failure.  Today's session focused on improving activity tolerance and dynamic balance exercises. Pt fatigued when preforming interval training on nustep. Pt reports enjoying nustep due to experiencing less pain in back. With dynamic balance exercises, pt displayed LOB posteriorly and required mod A to return to midline posture. With VCs to perform stepping strategy posterior to prevent LOB, pt was able to self correct balance w/ supervision. Pt was challenged w/ rocker board exercises especially anterior and posterior weight shifting. Pt also required frequent rest periods due to pain in back which recovered w/ rest. Continue to work on improving activity tolerance and dynamic balance exercises. Pt needs skilled PT services to address B LE weakness and defictis in balance, gait, and endurance in order to promote safe participation in home and community activtiies.   ??  Short Term Goals:  ??  Short Term Goal 1: Pt will demonstrate independence in performance of HEP x 1 in clinic to maximize benefits of PT. GOAL MET (02/17/17)  Time Frame : 4 weeks   Short Term Goal 2: Pt will perform 5x STS with UE support in < 17 seconds, indicating improvement in functional mobility. GOAL MET (02/07/17) Time Frame : 4 weeks   Short Term Goal 3: Pt will perform tandem stance > 10 seconds, indicating improved balance and reduced risk for falls GOAL MET (02/07/17)   Time Frame : 4 weeks                                     Long Term Goals:  Long Term Goal 1: Pt will demonstrate strength gain of +1 per MMT for every LE muscle graded < 5/5, indicating improved strength for functional mobility   Time Frame: 10 weeks   Long Term Goal 2: Pt will be able to stand up from a standard chair 5 times consecutively without use of UE support, indicating improvement in functional lower extremity muscle strength. GOAL MET (02/07/17) Revised Goal: Pt will demonstrate performance of 5x STS in < 12 sec, indicating improved functional mobility and reduced risk for falls.   Time Frame: 10 weeks   Long Term Goal 3: Pt will perform TUG in < 12 seconds, indicating reduced risk for falls. GOAL MET (03/28/17)   Time Frame: 10 weeks   Long Term Goal 4: (NEW GOAL added 02/24/17) Pt will be able to maintain SLS for at least 6.5 sec on each LE in order to demonstrate decrease in risk for falls.   Time Frame: 10 weeks             ??  PLAN:  2x week   for Duration: 10 weeks                                                                                                                                     SUBJECTIVE:  Pt denies pain and falls since last session. Pt reports that heat has helped back pain. He also states that he has difficulty sleeping at night due to the pain.     OBJECTIVE:                                            Vital Signs:  Sitting: BP = 144/69 mmHg, HR = 68 bpm, O2 = 96%  On 02/07/17: Sitting after 15 minutes of activity = 137/116 mmHg, HR = 82 bpm (69 bpm at rest), 98% O2  Pt demonstrated SOB and fatigue during functional mobility, O2 Sat remained > 98% throughout session  On 03/28/17: Sitting after STS exercise = 135/88, HR = 93 bpm, O2 sat = 99%  ??  Special Cerebellar Tests:   F-N-F: NT  RAM (ankle DF): WNLs, symmetrical  H-S: NT  ??  Visual/Vestibular Testing: not indicated  ??  Cognitive Status/Communication: Patient is alert, oriented, and cooperative, and able to provide historical information. He has a weak, breathy voice, but his speech is easily intelligible.   ??  Posture/Observations:   Sitting: rounded shoulders and forward head, pt demonstrated sacral sitting  Standing: forward flexed posture, rounded shoulders and forward head  Skin assessment/Edema: dry skin, good color and warmth  ??  ??  Sensation:   5.07 Semmes-Weinstein Monofilament: diminished bilateral LE  Impaired  R foot = 7/10, R LE (ankle to knee) = 7/10  L foot = 7/10, L LE (ankle to knee) = 7/10  Pt reports diminished sensation L > R   ??  Proprioception:  Intact at the great toe bilaterally, with 9 out of 10 correct responses on the L and 10 out of 10 correct responses on the R.   ??  Reflexes:   Not Tested  ??  Range of Motion/Flexibility:   Within normal limits   ??  Strength/MMT:   UE muscle strength is generally in the 4/5 range   ??  LE MMT   ??  LE MMT  Left  Right    Hip flex: (L2)  3+/5  4/5    Hip abd:  3-/5  3/5    Hip ext: -/5 -/5   Knee ext: (L3)  3+/5  4/5    Knee flex: (S2)  4/5  4/5    Ankle DF: (L4)  3+/5  3+/5    Ankle PF: (S1)  2+/5  2+/5    ??  Functional Mobility:  Pt is independent in all bed mobility and transfers, but performs them  slowly. Pt is unable to come to standing from standard chair independently without use of UEs for support.  Rolling: independent  Supine to sit: independent  Sit to supine: independent  Sit to stand: modified independent with use of bilateral UE support  ??  Balance:   Patient able to maintain balance in sitting and in typical stance position independently without UE support. Able to maintain feet together and semi-tandem stance positions for >10 sec without UE support.  ??  Gait Analysis:  decreased gait speed  decreased R step length  decreased R toe clearance   Pt demonstrated decreased bilateral push-off, heel strike R > L, and step length  Patient ambulates with rollator   ??  Functional Test/Outcome Measures:  5XSTS: unable; pt completed modified testing with 5 stands in 20.9 sec using B UE support; on 02/07/17, 15.6 sec without UE support; on 02/17/17, best time was 17.2 sec without UE support; on 02/24/17, best time was 15.0 sec without UE support; on 03/28/17, best time = 13.4 sec without UE support    ??  TUG: 18.6 sec with rollator; on 02/17/17 = 12.7 sec with rollator, 13.6 sec without an AD; on 03/28/17 = 9.6 sec with SPC   ??  Tandem Stance: 1-2 sec with L foot forward; 4.9 sec with R foot forward, on 02/07/17: L foot forward = 14.2 sec, R foot forward = 27 sec; on 03/28/17: L foot forward = 15.3 sec, R foot forward = 20.7 sec  ??  SLS: unable; on 02/07/17: L foot = 1-2 sec, R foot = 4.6 sec; on 04/04/17: L foot = 5.4 sec, R foot = 1-2 sec  ??  Gait speed on 02/07/17: 0.71 m/s, pt performed with rollator on flat, even surface across 20' with supervision; on 02/14/17 = 0.73 m/sec average over ~300'                    Today's Charges:  Treatment Rendered: 60 min    Therapeutic Exercise (12 min):     -nustep w/ b/l UEs and LEs level 5 -8 for 12 minutes w/ final 3 minutes of interval training (30 seconds at 70 SPM, 30 seconds at 30 SPM) to improve activity tolerance       Neuromuscular Re-education (43 min):   -lateral stepping w/ squats to improve dynamic balance 8x each direction w/ LOB x3 posteriorly w/ mod A to return to midline posture-VCs provided to perform stepping strategy posterior to reduce falls risk   -lateral stepping while throwing and catching a ball against wall to improve reactive balance 10x-pt displayed improvement in posterior stepping to return to midline posture   -ant/posterior stepping strategies 1x10 each LE, each direction w/out UE support w/ SBA to reduce falls risk during functional mobility   -standing perturbations at ant/post/lateral shoulders and hips for 2 minutes w/ appropriate stepping strategies to return to midline posture after LOB   -lateral weight shifting on rocker board 1x10 w/out UE support to improve ankle stability on compliant surface and reduce risk of falls during functional mobility   -ant/post weight shifting on rocker board 1x10 each direction w/out UE support to improve ankle stability and reduce falls risk   -lateral weight shifting on rocker board 1x5 w/ horizontal head turns each direction to improve stability when scanning the environment during functional activities   -ant/post weight shifting on rocker board 1x5 w/ vertical head rotations each direction to improve stability when scanning the environment   -walking lunges  1x12 each LE to improve LE strength and dynamic balance   -diagonal stepping to targets (8) w/ SBA w/ LOB x2 where pt was able to self correct to improve dynamic balance     -discussed w/ pt about use of melatonin to assist w/ pt sleeping. Pt reports will discuss w/ MD as well.                       I attest that I have reviewed the above information.  Signed: Para Skeans, PT  04/11/2017 5:18 PM

## 2017-04-17 ENCOUNTER — Ambulatory Visit: Admit: 2017-04-17 | Discharge: 2017-04-18 | Payer: MEDICARE

## 2017-04-17 DIAGNOSIS — E079 Disorder of thyroid, unspecified: Principal | ICD-10-CM

## 2017-04-17 NOTE — Unmapped (Signed)
[facility] Endocrinology Endocrine Follow    Assessment/Plan:  67 y.o. Male here for evaluation of hypothyroidism.  1. Abnormal thyroid labs. TFTs have improved, however his free T4 is still slightly elevated.  This is unlikely to be clinically significant, I recommend repeat TSH and free T4 in 6 months. He clinically has several symptoms which are unlikely to be caused by mild free T4 elevation with a normal TSH.  - Repeat TSH and free T4 in 6 months.    2. DM-2. Well controlled overall. He has spikes and occasional hypoglycemia. As he is under good control, I do not recommend any changes today. If he or his other providers would like me to take over his diabetes care, I offered him a follow up in 3 months.      All tests and treatments were discussed with the patient who agreed to the plans as documented. They currently have no questions and agree to call the clinic or contact us if questions arise.    Return to clinic: 6 months.    Alben Spittle, MD  [facility] Department of Endocrinology    Reason for Consult: Abnormal thyroid function tests.    Provider Requesting Consult: Graciela Husbands    PCP: [name], MD    My initial encounter history of present illness from 01/20/2017:  67 y.o. Male with relevant history of abnormal thyroid function tests, DM-2 and HCV cirrhosis s/p liver transplant in 09/2016 seen at the request of Dr. Graciela Husbands in consultation for evaluation of abnormal thyroid function tests. Initial diagnosis of abnormal thyroid function tests was on 11/01/2016. He had a prolonged hospital course after transplant which included A-fib for which he was on amiodarone. He has been on it for a number of weeks and stopped this medication last week due to a high TSH and a high free T4. Mr. Ellwood has had a slow recovery since his surgery and has chronic fatigue and weakness. He is currently using a walker for ambulation. He has chronic cold intolerance as well, which has been present for several years. He also reports an occasional tremor. He denies palpitations, unexpected weight change, diarrhea or constipation. Currently he feels at his baseline. He is fatigued and feels weak. He also complains of peripheral neuropathy symptoms. He tried gabapentin for this, but it caused severe fatigue and weakness.    Interim HPI:  Mr. Sonnenberg feels at his baseline today. 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 He was also admitted with the flu and in the hospital for 2 days. In addition to the neuropathy, he has a chronic tremor. He denies unexpected weight loss, but has difficulty gaining weight. He denies palpitations, temperature intolerance, diarrhea or constipation.      Past Medical History:   Diagnosis Date   ??? Cancer (CMS-HCC)    ??? Cirrhosis (CMS-HCC)    ??? Diabetes (CMS-HCC)    ??? Hepatitis C 07/17/2012   ??? Liver disease    ??? Low back pain 07/17/2012   ??? Varices, esophageal (CMS-HCC)        Past Surgical History:   Procedure Laterality Date   ??? ANKLE SURGERY     ??? BACK SURGERY     ??? BACK SURGERY     ??? CHG US GUIDE, TISSUE ABLATION N/A 01/22/2016    Procedure: ULTRASOUND GUIDANCE FOR, AND MONITORING OF, PARENCHYMAL TISSUE ABLATION;  Surgeon: Particia Nearing, MD;  Location: MAIN OR [facility];  Service: Transplant   ??? IR EMBOLIZATION  ORGAN ISCHEMIA, TUMORS, INFAR  06/16/2016    IR EMBOLIZATION ORGAN ISCHEMIA, TUMORS, INFAR 06/16/2016 Ammie Dalton, MD IMG VIR [facility]   ??? PR COLON CA SCRN NOT HI RSK IND N/A 02/27/2015    Procedure: COLOREC CNCR SCR;COLNSCPY NO;  Surgeon: Vonda Antigua, MD;  Location: GI PROCEDURES MEMORIAL [facility];  Service: Gastroenterology   ??? PR ENDOSCOPIC ULTRASOUND EXAM N/A 02/27/2015    Procedure: UGI ENDO; W/ENDO ULTRASOUND EXAM INCLUDES ESOPHAGUS, STOMACH, &/OR DUODENUM/JEJUNUM;  Surgeon: Vonda Antigua, MD;  Location: GI PROCEDURES MEMORIAL [facility];  Service: Gastroenterology   ??? PR ERCP BALLOON DILATE BILIARY/PANC DUCT/AMPULLA EA N/A 02/10/2017    Procedure: ERCP;WITH TRANS-ENDOSCOPIC BALLOON DILATION OF BILIARY/PANCREATIC DUCT(S) OR OF AMPULLA, INCLUDING SPHINCTERECTOMY, WHEN PERFOREMD,EACH DUCT ([id number]);  Surgeon: Mayford Knife, MD;  Location: GI PROCEDURES MEMORIAL [facility];  Service: Gastroenterology   ??? PR ERCP REMOVE FOREIGN BODY/STENT BILIARY/PANC DUCT N/A 02/10/2017    Procedure: ENDOSCOPIC RETROGRADE CHOLANGIOPANCREATOGRAPHY (ERCP); W/ REMOVAL OF FOREIGN BODY/STENT FROM BILIARY/PANCREATIC DUCT(S);  Surgeon: Mayford Knife, MD;  Location: GI PROCEDURES MEMORIAL [facility];  Service: Gastroenterology   ??? PR ERCP STENT PLACEMENT BILIARY/PANCREATIC DUCT N/A 11/29/2016    Procedure: ENDOSCOPIC RETROGRADE CHOLANGIOPANCREATOGRAPHY (ERCP); WITH PLACEMENT OF ENDOSCOPIC STENT INTO BILIARY OR PANCREATIC DUCT;  Surgeon: Chriss Driver, MD;  Location: GI PROCEDURES MEMORIAL [facility];  Service: Gastroenterology   ??? PR ERCP,W/REMOVAL STONE,BIL/PANCR DUCTS N/A 11/29/2016    Procedure: ERCP; W/ENDOSCOPIC RETROGRADE REMOVAL OF CALCULUS/CALCULI FROM BILIARY &/OR PANCREATIC DUCTS;  Surgeon: Chriss Driver, MD;  Location: GI PROCEDURES MEMORIAL [facility];  Service: Gastroenterology   ??? PR ERCP,W/REMOVAL STONE,BIL/PANCR DUCTS N/A 02/10/2017    Procedure: ERCP; W/ENDOSCOPIC RETROGRADE REMOVAL OF CALCULUS/CALCULI FROM BILIARY &/OR PANCREATIC DUCTS;  Surgeon: Mayford Knife, MD;  Location: GI PROCEDURES MEMORIAL [facility];  Service: Gastroenterology   ??? PR INSER HEART TEMP PACER ONE CHMBR N/A 10/02/2016    Procedure: Tempoarary Pacemaker Insertion;  Surgeon: Meredith Leeds, MD;  Location: [facility];  Service: Cardiology   ??? PR LAP,DIAGNOSTIC ABDOMEN N/A 01/22/2016    Procedure: Laparoscopy, Abdomen, Peritoneum, & Omentum, Diagnostic, W/Wo Collection Specimen(S) By Brushing Or Washing;  Surgeon: Particia Nearing, MD;  Location: MAIN OR [facility];  Service: Transplant   ??? PR PLACE PERCUT GASTROSTOMY TUBE N/A 11/17/2016    Procedure: UGI ENDO; W/DIRECTED PLCMT PERQ GASTROSTOMY TUBE;  Surgeon: Cletis Athens, MD;  Location: GI PROCEDURES [facility]OR[facility];  Service: Gastroenterology   ??? PR TRACHEOSTOMY, PLANNED N/A 09/29/2016    Procedure: TRACHEOSTOMY PLANNED (SEPART PROC);  Surgeon: Katherina Mires, MD;  Location: MAIN OR [facility];  Service: Trauma   ??? PR TRANSCATH INSERT OR REPLACE LEADLESS PM VENTR N/A 10/11/2016    Procedure: Pacemaker Implant/Replace Leadless;  Surgeon: Meredith Leeds, MD;  Location: [facility];  Service: Cardiology   ??? PR TRANSPLANT LIVER,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    Procedure: Liver Allotransplantation; Orthotopic, Partial Or Whole, From Cadaver Or Living Donor, Any Age;  Surgeon: Doyce Loose, MD;  Location: MAIN OR [facility];  Service: Transplant   ??? PR TRANSPLANT,PREP DONOR LIVER, WHOLE N/A 09/15/2016    Procedure: Rogelia Boga Std Prep Cad Donor Whole Liver Gft Prior Tnsplnt,Inc Chole,Diss/Rem Surr Tissu Wo Triseg/Lobe Splt;  Surgeon: Doyce Loose, MD;  Location: MAIN OR [facility];  Service: Transplant   ??? PR UPPER GI ENDOSCOPY,BIOPSY N/A 07/17/2012    Procedure: UGI ENDOSCOPY; WITH BIOPSY, SINGLE OR MULTIPLE;  Surgeon: Alba Destine, MD;  Location: GI PROCEDURES MEMORIAL [facility];  Service: Gastroenterology   ???  PR UPPER GI ENDOSCOPY,DIAGNOSIS N/A 02/04/2014    Procedure: UGI ENDO, INCLUDE ESOPHAGUS, STOMACH, & DUODENUM &/OR JEJUNUM; DX W/WO COLLECTION SPECIMN, BY BRUSH OR WASH;  Surgeon: Wilburt Finlay, MD;  Location: GI PROCEDURES MEMORIAL [facility];  Service: Gastroenterology   ??? PR UPPER GI ENDOSCOPY,LIGAT VARIX N/A 11/05/2013    Procedure: UGI ENDO; W/BAND LIG ESOPH &/OR GASTRIC VARICES;  Surgeon: Wilburt Finlay, MD;  Location: GI PROCEDURES MEMORIAL [facility];  Service: Gastroenterology       Family History   Problem Relation Age of Onset   ??? Hypertension Mother    ??? Cirrhosis Neg Hx    ??? Liver cancer Neg Hx    ??? Anemia Neg Hx    ??? Cancer Neg Hx    ??? Diabetes Neg Hx    ??? Kidney disease Neg Hx    ??? Obesity Neg Hx    ??? Thyroid disease Neg Hx    ??? Osteoporosis Neg Hx    ??? Coronary artery disease Neg Hx Social History     Social History   ??? Marital status: Married     Spouse name: N/A   ??? Number of children: N/A   ??? Years of education: N/A     Occupational History   ??? Not on file.     Social History Main Topics   ??? Smoking status: Never Smoker   ??? Smokeless tobacco: Never Used      Comment: Smoked in high school for about 5 years.    ??? Alcohol use No   ??? Drug use: No   ??? Sexual activity: Yes     Partners: Female     Birth control/ protection: None     Other Topics Concern   ??? Not on file     Social History Narrative   ??? No narrative on file       Review of systems:  General ROS:   Negative except as stated above.    Medication list:  Current Outpatient Prescriptions on File Prior to Visit   Medication Sig Dispense Refill   ??? acetaminophen (TYLENOL) 325 MG tablet Take 1 tablet (325 mg total) by mouth every four (4) hours as needed for pain. 100 tablet 2   ??? blood sugar diagnostic (FREESTYLE TEST) Strp by Other route Four (4) times a day (before meals and nightly). 100 each 11   ??? blood-glucose meter (GLUCOSE MONITORING KIT) kit Use as instructed 1 each 0   ??? capsaicin (ZOSTRIX) 0.025 % cream Apply topically Two (2) times a day. 60 g 0   ??? cholecalciferol, vitamin D3, 2,000 unit cap 1 capsule (2,000 Units total) by PEG Tube route daily. (Patient taking differently: Take 2,000 Units by mouth daily. ) 30 capsule 11   ??? digoxin (LANOXIN) 125 mcg tablet 0.5 tablets (62.5 mcg total) by G-tube route daily. 15 tablet 11   ??? insulin ASPART (NOVOLOG FLEXPEN U-100 INSULIN) 100 unit/mL injection pen Inject 0.06 mL (6 Units total) under the skin Three (3) times a day before meals. 4 mL 11   ??? insulin ASPART (NOVOLOG FLEXPEN) 100 unit/mL injection pen Additional mealtime per SSI-  151-200= 2 units,  201-250= 4 units, 251-300=add 6 units, 301-350=8 units, 351-400=10 units, >400 =12 units 1.8 mL 0   ??? insulin glargine (LANTUS) 100 unit/mL (3 mL) injection pen Inject 0.12 mL (12 Units total) under the skin nightly. 4 mL 11   ??? lancets Misc 1 each by Miscellaneous route Four (4) times a day (  before meals and nightly). 100 each 11   ??? MAGNESIUM, AMINO ACID CHELATE, 133 mg tablet TAKE 1 TABLET BY MOUTH TWICE DAILY 100 tablet PRN   ??? melatonin 3 mg Tab Take 1 tablet (3 mg total) by mouth nightly. 30 tablet 0   ??? mycophenolate (CELLCEPT) 250 mg capsule Take 2 capsules (500 mg total) by mouth Two (2) times a day. 360 capsule 3   ??? NEORAL 100 mg capsule Take 2 capsules (200 mg total) by mouth Two (2) times a day. 120 capsule 11   ??? omeprazole (PRILOSEC) 20 MG capsule Take 1 capsule (20 mg total) by mouth daily. 30 capsule 1   ??? pen needle, diabetic (ULTICARE PEN NEEDLE) 32 gauge x 5/32 Ndle 1 each by Miscellaneous route Four (4) times a day (before meals and nightly). 90 each 3   ??? traMADol (ULTRAM) 50 mg tablet Take 50 mg by mouth. Every 4 to 6 hours PRN     ??? warfarin (COUMADIN) 3 MG tablet Take 3 mg by mouth nightly.        Current Facility-Administered Medications on File Prior to Visit   Medication Dose Route Frequency Provider Last Rate Last Dose   ??? pregabalin (LYRICA) capsule 50 mg  50 mg Oral Nightly Oran Rein, MD           Physical exam:  Vitals:    04/17/17 1259   BP: 125/75   Pulse: 82     Wt Readings from Last 3 Encounters:   04/17/17 61.6 kg (135 lb 12.8 oz)   04/11/17 61.6 kg (135 lb 12.9 oz)   04/10/17 61.2 kg (135 lb)     Physical Exam   Constitutional: He is oriented to person, place, and time. He appears well-developed and well-nourished. No distress.   Cardiovascular: Normal rate and regular rhythm.    No murmur heard.  Pulmonary/Chest: Effort normal and breath sounds normal. No respiratory distress. He has no wheezes.   Abdominal: Soft. Bowel sounds are normal. He exhibits no distension. There is no tenderness.   Neurological: He is alert and oriented to person, place, and time.   Skin: He is not diaphoretic.   Psychiatric: He has a normal mood and affect. His behavior is normal.         Labs reviewed:  Lab Results Component Value Date    A1C 5.1 12/26/2016       Lab Results   Component Value Date    NA 136 04/10/2017    K 4.8 04/10/2017    CL 105 04/10/2017    CO2 23.0 04/10/2017    BUN 30 (H) 04/10/2017    CREATININE 1.37 (H) 04/10/2017    GFR >= 60 02/15/2012    GLU 141 (H) 04/10/2017    CALCIUM 10.2 04/10/2017    ALBUMIN 3.7 04/10/2017    PHOS 2.9 04/10/2017       Lab Results   Component Value Date    TSH 1.970 04/10/2017       Results for THORSTEN, Jeffrey Ward (MRN [id number]) as of 04/17/2017 13:10   Ref. Range 11/01/2016 06:29 11/02/2016 06:18 12/26/2016 08:58 02/06/2017 09:30 04/10/2017 09:55   TSH Latest Ref Range: 0.600 - 3.300 uIU/mL 4.094 (H)  8.260 (H) 6.124 (H) 1.970   Free T4 Latest Ref Range: 0.71 - 1.40 ng/dL  1.02 (H) 7.25 (H) 3.66 (H) 1.77 (H)         Other studies reviewed:  None

## 2017-04-18 DIAGNOSIS — Z7409 Other reduced mobility: Secondary | ICD-10-CM

## 2017-04-18 DIAGNOSIS — R29898 Other symptoms and signs involving the musculoskeletal system: Principal | ICD-10-CM

## 2017-04-18 DIAGNOSIS — G6289 Other specified polyneuropathies: Secondary | ICD-10-CM

## 2017-04-18 DIAGNOSIS — Z9181 History of falling: Secondary | ICD-10-CM

## 2017-04-18 DIAGNOSIS — M5442 Lumbago with sciatica, left side: Secondary | ICD-10-CM

## 2017-04-18 NOTE — Unmapped (Signed)
[facility] ALLIED HEALTH PT [facility]  OUTPATIENT PHYSICAL THERAPY  Treatment Note  04/18/2017    Patient Name: Jeffrey Ward  Date of Birth:1951/03/10  Session Number: 8  Diagnosis:   Encounter Diagnoses   Name Primary?   ??? Weakness of both lower extremities Yes   ??? Impaired functional mobility, balance, gait, and endurance    ??? Other polyneuropathy    ??? Risk for falls    ??? Midline low back pain with left-sided sciatica, unspecified chronicity        Onset of Symptoms: 10/31/16  Date of Evaluation: 01/31/17     Dates of Certification: 01/31/17 - 04/11/17; requesting recertification for 04/18/17 - 06/13/17       Problem List: Decreased strength, Decreased range of motion, Decreased endurance, Impaired balance, Decreased mobility, Impaired sensation, Pain, Fall Risk, Gait deviation    ASSESSMENT:    Assessment: Pt is a 67 year old male being seen for deconditioning, balance/gait difficulties, and increased risk of falls following a liver transplant (July 2018). Past medical history also significant for cancer, peripheral neuropathy, hepatitis C, and heart failure. Pt is demonstrating improvements in lower extremity strength and walking speed compared to his initial evaluation. Pt ambulated >300??? outside today with good gait mechanics using a SPC. Pt needs skilled PT services to address B LE weakness and deficits in balance, gait, and endurance in order to promote safe participation in home and community activities. He has reached the end of current POC, but needs 6-8 additional visits over an 8-week period in order to achieve remaining goals.       Short Term Goals:       Short Term Goal 1: Pt will demonstrate independence in performance of HEP x 1 in clinic to maximize benefits of PT. GOAL MET (02/17/17)  Time Frame : 4 weeks   Short Term Goal 2: Pt will perform 5x STS with UE support in < 17 seconds, indicating improvement in functional mobility. GOAL MET (02/07/17)   Time Frame : 4 weeks   Short Term Goal 3: Pt will perform tandem stance > 10 seconds, indicating improved balance and reduced risk for falls GOAL MET (02/07/17)   Time Frame : 4 weeks                          Long Term Goals:  Long Term Goal 1: Pt will demonstrate strength gain of +1 per MMT for every LE muscle graded < 5/5, indicating improved strength for functional mobility   Time Frame: 10 weeks   Long Term Goal 2: Pt will be able to stand up from a standard chair 5 times consecutively without use of UE support, indicating improvement in functional lower extremity muscle strength. GOAL MET (02/07/17) Revised Goal: Pt will demonstrate performance of 5x STS in < 12 sec, indicating improved functional mobility and reduced risk for falls.   Time Frame: 10 weeks   Long Term Goal 3: Pt will perform TUG in < 12 seconds, indicating reduced risk for falls. GOAL MET (03/28/17)   Time Frame: 10 weeks   Long Term Goal 4: (NEW GOAL added 02/24/17) Pt will be able to maintain SLS for at least 6.5 sec on each LE in order to demonstrate decrease in risk for falls.   Time Frame: 10 weeks               PLAN:  2x week   for Duration: 10 weeks  SUBJECTIVE:  Subjective : Pt reports that he didn???t get any sleep last night and is very tired today. He states that he had a doctors appointment prior to therapy today and has noticed an increase in his back pain all day. Pt denies any recent falls.  Precautions: Cancer history, Fall risk       OBJECTIVE:      Pain: Yes  Location: low back  Current Pain Level: 7 Max Pain Level: 10 Least Pain Level: 5  Pain Frequency: Constant/continuous  Pain aggravated by: walking, sitting, lying            Pain related behaviors: None        Vital Signs:  Sitting: BP = 144/69 mmHg, HR = 68 bpm, O2 = 96%  On 02/07/17: Sitting after 15 minutes of activity = 137/116 mmHg, HR = 82 bpm (69 bpm at rest), 98% O2  Pt demonstrated SOB and fatigue during functional mobility, O2 Sat remained > 98% throughout session  On 03/28/17: Sitting after STS exercise = 135/88, HR = 93 bpm, O2 sat = 99%  ??  Special Cerebellar Tests:   F-N-F: NT  RAM (ankle DF): WNLs, symmetrical  H-S: NT  ??  Visual/Vestibular Testing: not indicated  ??  Cognitive Status/Communication: Patient is alert, oriented, and cooperative, and able to provide historical information. He has a weak, breathy voice, but his speech is easily intelligible.   ??  Posture/Observations:   Sitting: rounded shoulders and forward head, pt demonstrated sacral sitting  Standing: forward flexed posture, rounded shoulders and forward head  Skin assessment/Edema: dry skin, good color and warmth  ??  ??  Sensation:   5.07 Semmes-Weinstein Monofilament: diminished bilateral LE  Impaired  R foot = 7/10, R LE (ankle to knee) = 7/10  L foot = 7/10, L LE (ankle to knee) = 7/10  Pt reports diminished sensation L > R   ??  Proprioception:  Intact at the great toe bilaterally, with 9 out of 10 correct responses on the L and 10 out of 10 correct responses on the R.   ??  Reflexes:   Not Tested  ??  Range of Motion/Flexibility:   Within normal limits   ??  Strength/MMT:   UE muscle strength is generally in the 4/5 range   ??  LE MMT   ??  LE MMT  Left  Right    Hip flex: (L2)  4-/5  4/5    Hip abd:  3+/5  3+/5    Hip ext: -/5 -/5   Knee ext: (L3)  4-/5  4/5    Knee flex: (S2)  4/5  4/5    Ankle DF: (L4)  4-/5  4-/5    Ankle PF: (S1)  3-/5  2+/5    ??  Functional Mobility:  Pt is independent in all bed mobility and transfers, but performs them slowly. Pt is unable to come to standing from standard chair independently without use of UEs for support.  Rolling: independent  Supine to sit: independent  Sit to supine: independent  Sit to stand: modified independent with use of bilateral UE support  ??  Balance:   Patient able to maintain balance in sitting and in typical stance position independently without UE support. Able to maintain feet together and semi-tandem stance positions for >10 sec without UE support.  ??  Gait Analysis:  decreased gait speed decreased R step length  decreased R toe clearance   Pt demonstrated decreased bilateral push-off,  heel strike R > L, and step length  Patient ambulates with rollator   ??  Functional Test/Outcome Measures:  5XSTS: unable; pt completed modified testing with 5 stands in 20.9 sec using B UE support; on 02/07/17, 15.6 sec without UE support; on 02/17/17, best time was 17.2 sec without UE support; on 02/24/17, best time was 15.0 sec without UE support; on 03/28/17, best time = 13.4 sec without UE support    ??  TUG: 18.6 sec with rollator; on 02/17/17 = 12.7 sec with rollator, 13.6 sec without an AD; on 03/28/17 = 9.6 sec with SPC   ??  Tandem Stance: 1-2 sec with L foot forward; 4.9 sec with R foot forward, on 02/07/17: L foot forward = 14.2 sec, R foot forward = 27 sec; on 03/28/17: L foot forward = 15.3 sec, R foot forward = 20.7 sec  ??  SLS: unable; on 02/07/17: L foot = 1-2 sec, R foot = 4.6 sec; on 04/04/17: L foot = 5.4 sec, R foot = 1-2 sec  ??  Gait speed on 02/07/17: 0.71 m/s, pt performed with rollator on flat, even surface across 20' with supervision; on 02/14/17 = 0.73 m/sec average over ~300'; on 04/18/17 = 0.84 m/s    Education Provided: HEP  Education results: Verbalized understanding, demonstrated understanding       Total Time: 45    Today's Charges:  Treatment Rendered: 45 min    Neuromuscular Re-education: 35 min (2 units)  - Maintaining balance while standing on one foot and alternating cone taps; multiple reps with CGA  - Diagonal stepping to targets, multiple reps, CGA with no LOB; somewhat difficult activity for pt, but generally tolerated well  - Maintaining balance while side stepping and walking forward on foam balance beam without UE support, multiple reps, with CG to occasional min A; challenging activity for pt  - Negotiating obstacle course which included stepping on to and off of foam pad, stepping over 9-high hurdle, walking length of foam balance beam sideways, and stepping on one side of tilt board and taking small steps forward until board tipped to other side, multiple reps with CGA; challenging activity for pt, but generally performed well  - lateral stepping with squat; 2x12' with CGA to min A; challenging exercise for pt    Gait Training: 10 min (1 unit)  -Walking >300' outdoors on paved level surfaces and up/down curb using SPC with CGA; pt demonstrated good gait mechanics and walking speed during this activity today, with no instances of LOB                    I attest that I have reviewed the above information.  Signed: Gracy Racer, SPT  04/18/2017 12:50 PM     I was physically present and immediately available to direct and supervise tasks that were related to patient management. The direction and supervision were continuous throughout the time these tasks were performed.     Signed: Sedalia Muta, PT  04/18/2017 12:57 PM

## 2017-04-21 ENCOUNTER — Ambulatory Visit: Admit: 2017-04-21 | Discharge: 2017-04-21 | Payer: MEDICARE

## 2017-04-21 DIAGNOSIS — I2721 Secondary pulmonary arterial hypertension: Principal | ICD-10-CM

## 2017-04-21 DIAGNOSIS — Z944 Liver transplant status: Principal | ICD-10-CM

## 2017-04-21 DIAGNOSIS — K766 Portal hypertension: Secondary | ICD-10-CM

## 2017-04-22 NOTE — Unmapped (Signed)
[facility] at [facility]   Pulmonary Hypertension Program                                               Lattie Haw, MD???Director (Pulmonary)     Denice Bors. Tresa Res, MD--Associate Director (Pulmonary)    Freeman Caldron, MD (Cardiology)     Esmond Plants, DO (Pulmonary)    Marva Panda, RN Nurse Coordinator     Claretha Cooper, RN Nurse Coordinator    [phone] office - [phone] fax          [name]  is a 67 y.o. male  patient referred by [name], MD for work up and evaluation for pulmonary hypertension.    PAH Treatment History:  Sildenafil 40 mg tid since post liver txplt hospitalization.    Pt describes a history of recent liver txplt in 09/2016.  Post op was noted to have previously unrecognized PAH and RV failure, treated initially with inhaled iloprost while on vent support and then transitioned to sildenafil.  He had a long post op course requiring a stay in rehab of about 2 weeks prior to discharge.   His post op course was further complicated by AKI (on CRRT then had renal recovery), afib on anticoagulation currently, bradycardia requiring pacemaker placement.  CTA negative for PE post op.      No LE edema currently.      For unclear reasons, his sildenafil was stopped since last visit at some point in approx last 2 months.  Patient's wife states she was told by [facility] pharmacy that there were no longer refills.   Patient does relate some dyspnea on exertion, stable over last 2 months.  Also relates recent flu and persistent dry cough.  No fever, chills, or night sweats.  No hemoptysis.     No syncope or near syncope.        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      The patient's current NYHA functional class is Class 2 - comfortable at rest but have symptoms with ordinary physcial activity.     Will follow up today's echocardiogram results once available. Concern for increased PAP and RV dysfunction leading to hepatic congestion in absence of sildenafil.        Needs to restart sildenafil.  Will plan to refill sildenafil on Monday via [facility] at 40 mg tid.      Re-educated patient and spouse on importance of not stopping sildenafil, as PH may worsen, leading to hepatic congestion and potential rejection of liver graft.      Plan:          1) refill sildenafil 40 mg tid through [facility] pharmacy.      2) next visit in 3 months.      3) follow up echo results.      Subjective:      Past medical history is significant for:   Past Medical History:   Diagnosis Date   ??? Cancer (CMS-HCC)    ??? Cirrhosis (CMS-HCC)    ??? Diabetes (CMS-HCC)    ??? Hepatitis C 07/17/2012   ??? Liver disease    ??? Low back pain 07/17/2012   ??? Varices, esophageal (CMS-HCC)  Family History   Problem Relation Age of Onset   ??? Hypertension Mother    ??? Cirrhosis Neg Hx    ??? Liver cancer Neg Hx    ??? Anemia Neg Hx    ??? Cancer Neg Hx    ??? Diabetes Neg Hx    ??? Kidney disease Neg Hx    ??? Obesity Neg Hx    ??? Thyroid disease Neg Hx    ??? Osteoporosis Neg Hx    ??? Coronary artery disease Neg Hx       Social History     Social History   ??? Marital status: Married     Spouse name: N/A   ??? Number of children: N/A   ??? Years of education: N/A     Occupational History   ??? Not on file.     Social History Main Topics   ??? Smoking status: Never Smoker   ??? Smokeless tobacco: Never Used      Comment: Smoked in high school for about 5 years.    ??? Alcohol use No   ??? Drug use: No   ??? Sexual activity: Yes     Partners: Female     Birth control/ protection: None     Other Topics Concern   ??? Not on file     Social History Narrative   ??? No narrative on file      Past Surgical History:   Procedure Laterality Date   ??? ANKLE SURGERY     ??? BACK SURGERY     ??? BACK SURGERY     ??? CHG US GUIDE, TISSUE ABLATION N/A 01/22/2016    Procedure: ULTRASOUND GUIDANCE FOR, AND MONITORING OF, PARENCHYMAL TISSUE ABLATION;  Surgeon: Particia Nearing, MD;  Location: MAIN OR [facility];  Service: Transplant   ??? IR EMBOLIZATION ORGAN ISCHEMIA, TUMORS, INFAR  06/16/2016    IR EMBOLIZATION ORGAN ISCHEMIA, TUMORS, INFAR 06/16/2016 Ammie Dalton, MD IMG VIR H&V [facility]   ??? PR COLON CA SCRN NOT HI RSK IND N/A 02/27/2015    Procedure: COLOREC CNCR SCR;COLNSCPY NO;  Surgeon: Vonda Antigua, MD;  Location: GI PROC[facility]URES [facility];  Service: Gastroenterology   ??? PR ENDOSCOPIC ULTRASOUND EXAM N/A 02/27/2015    Procedure: UGI ENDO; W/ENDO ULTRASOUND EXAM INCLUDES ESOPHAGUS, STOMACH, &/OR DUODENUM/JEJUNUM;  Surgeon: Vonda Antigua, MD;  Location: [facility] PROC[facility]URES [facility];  Service: Gastroenterology   ??? PR ERCP BALLOON DILATE BILIARY/PANC DUCT/AMPULLA EA N/A 02/10/2017    Procedure: ERCP;WITH TRANS-ENDOSCOPIC BALLOON DILATION OF BILIARY/PANCREATIC DUCT(S) OR OF AMPULLA, INCLUDING SPHINCTERECTOMY, WHEN PERFOREMD,EACH DUCT (09811);  Surgeon: Mayford Knife, MD;  Location: GI PROC[facility]URES [facility];  Service: Gastroenterology   ??? PR ERCP REMOVE FOREIGN BODY/STENT BILIARY/PANC DUCT N/A 02/10/2017    Procedure: ENDOSCOPIC RETROGRADE CHOLANGIOPANCREATOGRAPHY (ERCP); W/ REMOVAL OF FOREIGN BODY/STENT FROM BILIARY/PANCREATIC DUCT(S);  Surgeon: Mayford Knife, MD;  Location: GI PROCEDURES MEMORIAL [facility];  Service: Gastroenterology   ??? PR ERCP STENT PLACEMENT BILIARY/PANCREATIC DUCT N/A 11/29/2016    Procedure: ENDOSCOPIC RETROGRADE CHOLANGIOPANCREATOGRAPHY (ERCP); WITH PLACEMENT OF ENDOSCOPIC STENT INTO BILIARY OR PANCREATIC DUCT;  Surgeon: Chriss Driver, MD;  Location: GI PROCEDURES MEMOR[facility];  Service: Gastroenterology   ??? PR ERCP,W/REMOVAL STONE,BIL/PANCR DUCTS N/A 11/29/2016    Procedure: ERCP; W/ENDOSCOPIC RETROGRADE REMOVAL OF CALCULUS/CALCULI FROM BILIARY &/OR PANCREATIC DUCTS;  Surgeon: Chriss Driver, MD;  Location: GI PROCEDURES [facility]OR[facility];  Service: Gastroenterology   ??? PR ERCP,W/REMOVAL STONE,BIL/PANCR DUCTS N/A 02/10/2017    Procedure: ERCP; W/ENDOSCOPIC RETROGRADE  REMOVAL OF CALCULUS/CALCULI FROM BILIARY &/OR PANCREATIC DUCTS;  Surgeon: Mayford Knife, MD;  Location: GI PROC[facility]URES [facility];  Service: Gastroenterology   ??? PR INSER HEART TEMP PACER ONE CHMBR N/A 10/02/2016    Procedure: Tempoarary Pacemaker Insertion;  Surgeon: Meredith Leeds, MD;  Location: [facility];  Service: Cardiology   ??? PR LAP,DIAGNOSTIC ABDOMEN N/A 01/22/2016    Procedure: Laparoscopy, Abdomen, Peritoneum, & Omentum, Diagnostic, W/Wo Collection Specimen(S) By Brushing Or Washing;  Surgeon: Particia Nearing, MD;  Location: MAIN OR [facility];  Service: Transplant   ??? PR PLACE PERCUT GASTROSTOMY TUBE N/A 11/17/2016    Procedure: UGI ENDO; W/DIRECTED PLCMT PERQ GASTROSTOMY TUBE;  Surgeon: Cletis Athens, MD;  Location: GI PROCEDURES [facility];  Service: Gastroenterology   ??? PR TRACHEOSTOMY, PLANNED N/A 09/29/2016    Procedure: TRACHEOSTOMY PLANNED (SEPART PROC);  Surgeon: Katherina Mires, MD;  Location: MAIN OR [facility];  Service: Trauma   ??? PR TRANSCATH INSERT OR REPLACE LEADLESS PM VENTR N/A 10/11/2016    Procedure: Pacemaker Implant/Replace Leadless;  Surgeon: Meredith Leeds, MD;  Location: [facility] EP;  Service: Cardiology   ??? PR TRANSPLANT LIVER,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    Procedure: Liver Allotransplantation; Orthotopic, Partial Or Whole, From Cadaver Or Living Donor, Any Age;  Surgeon: Doyce Loose, MD;  Location: MAIN OR [facility];  Service: Transplant   ??? PR TRANSPLANT,PREP DONOR LIVER, WHOLE N/A 09/15/2016    Procedure: Rogelia Boga Std Prep Cad Donor Whole Liver Gft Prior Tnsplnt,Inc Chole,Diss/Rem Surr Tissu Wo Triseg/Lobe Splt;  Surgeon: Doyce Loose, MD;  Location: MAIN OR [facility];  Service: Transplant   ??? PR UPPER GI ENDOSCOPY,BIOPSY N/A 07/17/2012    Procedure: UGI ENDOSCOPY; WITH BIOPSY, SINGLE OR MULTIPLE;  Surgeon: Alba Destine, MD;  Location: GI PROCEDURES MEMORIAL [facility];  Service: Gastroenterology   ??? PR UPPER GI ENDOSCOPY,DIAGNOSIS N/A 02/04/2014    Procedure: UGI ENDO, INCLUDE ESOPHAGUS, STOMACH, & DUODENUM &/OR JEJUNUM; DX W/WO COLLECTION SPECIMN, BY BRUSH OR WASH;  Surgeon: Wilburt Finlay, MD;  Location: GI PROCEDURES [facility];  Service: Gastroenterology   ??? PR UPPER GI ENDOSCOPY,LIGAT VARIX N/A 11/05/2013    Procedure: UGI ENDO; W/BAND LIG ESOPH &/OR GASTRIC VARICES;  Surgeon: Wilburt Finlay, MD;  Location: GI PROCEDURES [facility];  Service: Gastroenterology        Objective:   Objective   Review of systems is significant for:     As stated in the HPI, remainder of 12 system review is negative.      No Known Allergies   Current Outpatient Prescriptions   Medication Sig Dispense Refill   ??? acetaminophen (TYLENOL) 325 MG tablet Take 1 tablet (325 mg total) by mouth every four (4) hours as needed for pain. 100 tablet 2   ??? blood sugar diagnostic (FREESTYLE TEST) Strp by Other route Four (4) times a day (before meals and nightly). 100 each 11   ??? blood-glucose meter (GLUCOSE MONITORING KIT) kit Use as instructed 1 each 0   ??? capsaicin (ZOSTRIX) 0.025 % cream Apply topically Two (2) times a day. 60 g 0   ??? cholecalciferol, vitamin D3, 2,000 unit cap 1 capsule (2,000 Units total) by PEG Tube route daily. (Patient taking differently: Take 2,000 Units by mouth daily. ) 30 capsule 11   ??? digoxin (LANOXIN) 125 mcg tablet 0.5 tablets (62.5 mcg total) by G-tube route daily. 15 tablet 11   ??? insulin ASPART (NOVOLOG FLEXPEN U-100 INSULIN) 100 unit/mL injection pen Inject 0.06 mL (6  Units total) under the skin Three (3) times a day before meals. 4 mL 11   ??? insulin ASPART (NOVOLOG FLEXPEN) 100 unit/mL injection pen Additional mealtime per SSI-  151-200= 2 units,  201-250= 4 units, 251-300=add 6 units, 301-350=8 units, 351-400=10 units, >400 =12 units 1.8 mL 0   ??? insulin glargine (LANTUS) 100 unit/mL (3 mL) injection pen Inject 0.12 mL (12 Units total) under the skin nightly. 4 mL 11   ??? lancets Misc 1 each by Miscellaneous route Four (4) times a day (before meals and nightly). 100 each 11   ??? MAGNESIUM, AMINO ACID CHELATE, 133 mg tablet TAKE 1 TABLET BY MOUTH TWICE DAILY 100 tablet PRN   ??? melatonin 3 mg Tab Take 1 tablet (3 mg total) by mouth nightly. 30 tablet 0   ??? mycophenolate (CELLCEPT) 250 mg capsule Take 2 capsules (500 mg total) by mouth Two (2) times a day. 360 capsule 3   ??? NEORAL 100 mg capsule Take 2 capsules (200 mg total) by mouth Two (2) times a day. 120 capsule 11   ??? omeprazole (PRILOSEC) 20 MG capsule Take 1 capsule (20 mg total) by mouth daily. 30 capsule 1   ??? pen needle, diabetic (ULTICARE PEN NEEDLE) 32 gauge x 5/32 Ndle 1 each by Miscellaneous route Four (4) times a day (before meals and nightly). 90 each 3   ??? traMADol (ULTRAM) 50 mg tablet Take 50 mg by mouth. Every 4 to 6 hours PRN     ??? warfarin (COUMADIN) 3 MG tablet Take 3 mg by mouth nightly.        Current Facility-Administered Medications   Medication Dose Route Frequency Provider Last Rate Last Dose   ??? pregabalin (LYRICA) capsule 50 mg  50 mg Oral Nightly Oran Rein, MD            Physical Exam:    height is 170.2 cm (5' 7) and weight is 60.4 kg (133 lb 1.6 oz). His oral temperature is 36.3 ??C (97.4 ??F). His blood pressure is 159/70 and his pulse is 70. His oxygen saturation is 100%.   General Appearance:    Alert, cooperative, no distress, appears stated age   Head:    Normocephalic, without obvious abnormality, atraumatic   Eyes:    PERRL, conjunctiva clear, EOM's intact    Oropharynx:   No lesions   Neck:   Supple, trachea midline, no adenopathy;    No JVD   Lungs:     Good breath sounds b/l, no crackles or wheezes, respirations unlabored   Chest Wall:    No tenderness or deformity    Heart:    Regular rate and rhythm, S1 and S2 normal, no murmur, rub   or gallop   Abdomen:     Soft, non-tender, normal bowel sounds, no masses, no organomegaly.  + G tube in place   Extremities:   Extremities normal, no cyanosis, clubbing, or edema   Pulses:   2+ and symmetric all extremities   Skin:   No rashes or lesions   Lymph nodes:   No cervical or supraclavicular adenopathy   Neurologic:   No focal neurological deficits       Diagnostic Review:      V/Q scan 01/11/17:    Impression     Normal ventilation and perfusion scan.         CT scan PE protocol performed on 09/2016 post op revealed:    Impression     No  CT evidence for acute pulmonary embolism, as clinically questioned.    Moderate bilateral pleural effusions similar to prior CT dated 01/30/2016 with slightly increased atelectasis of the adjacent lung parenchyma in the bilateral lower lobes, left greater than right.    Addendum (Dr.Altun dictating 09/27/2016 08:35 AM) Focal contour bulging of the liver adjacent to the diaphragm and right kidney is noted which is not well evaluated due to the presence of streak artifacts.          Pulmonary Function Test performed on 11/24/15 show:            Consistent with normal spirometry and normal DLCO    6 Minute Walk Test performed on 01/06/17:         Echo performed on 09/27/16:    Echocardiographic Findings     Left Ventricle The left ventricle is normal in size with normal wall thickness.   The left ventricular systolic function is normal.   The ejection fraction is visually estimated at 60 to 65%.   Left ventricular diastolic function cannot be accurately assessed.      Left Atrium The left atrium is normal in size.      Right Ventricle The right ventricle is moderately dilated.   The right ventricular systolic function is decreased.      Tricuspid Valve The tricuspid valve is not well visualized.   There is mild tricuspid regurgitation by color and continuous wave Doppler imaging.   The regurgitation jet is directed toward the septum.   The pulmonary artery systolic pressure is severely elevated.   - Peak tricuspid regurgitant velocity: 4.6 m/sec  - Estimated pulmonary artery systolic pressure: 80 + RA pressure mmHg      Right Atrium The right atrium is poorly visualized but appears dilated      IVC/SVC Mechanical ventilation precludes the ability to accurately assess right atrial pressure.      Pericardium There is a small anterior pericardial effusion.          [facility] Catheterization performed on date 09/22/16:     Wedge pressure initially looks to be 15-16 but with further balloon deflation this clearly decreases further to 8 mmHg with very good quality tracing. At that time mPAP 50, CVP 9, CO by CCO measurements 4.1/CI 2.2, PVR approximating 10 WU.

## 2017-04-22 NOTE — Unmapped (Signed)
Thank you for visiting the [facility] Pulmonary Hypertension Clinic.    If you have any questions or concerns, please call     H. Sherre Lain, MD    Marva Panda, RN  Claretha Cooper, RN    Pulmonary Hypertension nurse coordinators:    [phone].

## 2017-04-24 ENCOUNTER — Institutional Professional Consult (permissible substitution): Admit: 2017-04-24 | Discharge: 2017-04-25 | Payer: MEDICARE

## 2017-04-24 DIAGNOSIS — Z950 Presence of cardiac pacemaker: Principal | ICD-10-CM

## 2017-04-24 NOTE — Unmapped (Signed)
[facility]   Patient Onboarding/Medication Counseling    Jeffrey Ward is a 67 y.o. male with a transplant who I am counseling today on initiation of therapy.    Medication: Sildenafil 20mg     Verified patient's date of birth / HIPAA.      Education Provided: ??    Dose/Administration discussed: Take 2 tablets (40mg  total) by G-tube tid. Dissolve 1& 1/2 tablets in water and administer via PEG tube. This medication should be taken  with or without food..     Storage requirements: Patient wife declined this education. Patient has already been taking this medication.      Side effects discussed: Patient wife declined this education. Patient has already been taking this medication.    Handling precautions reviewed:  Patient wife declined this education. Patient has already been taking this medication..    Drug Interactions: other medications reviewed and up to date in Epic.  Patient wife declined this education. Patient has already been taking this medication..    Comorbidities/Allergies: reviewed and up to date in Epic.    Verified therapy is appropriate and should continue      Delivery Information    Anticipated copay of $47 reviewed with patient. Verified delivery address in [facility] and reviewed medication storage requirement.    Scheduled delivery date: 04/25/2017    Explained that we ship using UPS and this shipment will not require a signature.      Explained the services we provide at [facility] and that each month we would call to set up refills.  Stressed importance of returning phone calls so that we could ensure they receive their medications in time each month.  Informed patient that we should be setting up refills 7-10 days prior to when they will run out of medication.  Informed patient that welcome packet will be sent.      Patient verbalized understanding of the above information as well as how to contact the pharmacy at [phone] option 4 with any questions/concerns.        Patient Specific Needs      ? Patient has no physical or cognitive barriers.    ? Patient prefers to have medications discussed with  Family Member     ? Patient is able to read and understand education materials at a high school level or above.        Rielle Schlauch  Anders Grant  [facility] Specialty Pharmacist

## 2017-04-24 NOTE — Unmapped (Signed)
Remote Monitoring Report    DATE OF SERVICE: 04/24/2017    INDICATION FOR IMPLANT: Heart Block /Micro  IMPLANTING/FOLLOWING EP: Syed      DEVICE AND LEAD INFORMATION        PRESENTING EGM: VS     DEVICE DETECTED EVENTS    No events   ?? ??????????????????????  IMPRESSION: normal device function     RECOMMENDATIONS  -Continue home monitoring as ordered with yearly in person checks

## 2017-04-25 ENCOUNTER — Ambulatory Visit: Admit: 2017-04-25 | Discharge: 2017-04-26 | Payer: MEDICARE

## 2017-04-25 DIAGNOSIS — Z79899 Other long term (current) drug therapy: Secondary | ICD-10-CM

## 2017-04-25 DIAGNOSIS — K769 Liver disease, unspecified: Secondary | ICD-10-CM

## 2017-04-25 DIAGNOSIS — Z5181 Encounter for therapeutic drug level monitoring: Secondary | ICD-10-CM

## 2017-04-25 DIAGNOSIS — Z7901 Long term (current) use of anticoagulants: Secondary | ICD-10-CM

## 2017-04-25 DIAGNOSIS — Z944 Liver transplant status: Principal | ICD-10-CM

## 2017-04-25 LAB — COMPREHENSIVE METABOLIC PANEL
ALBUMIN: 3.6 g/dL (ref 3.5–5.0)
ALKALINE PHOSPHATASE: 189 U/L — ABNORMAL HIGH (ref 38–126)
ALT (SGPT): 16 U/L — ABNORMAL LOW (ref 19–72)
AST (SGOT): 13 U/L — ABNORMAL LOW (ref 19–55)
BILIRUBIN TOTAL: 0.6 mg/dL (ref 0.0–1.2)
BLOOD UREA NITROGEN: 40 mg/dL — ABNORMAL HIGH (ref 7–21)
BUN / CREAT RATIO: 21
CALCIUM: 10.5 mg/dL — ABNORMAL HIGH (ref 8.5–10.2)
CHLORIDE: 101 mmol/L (ref 98–107)
CO2: 25 mmol/L (ref 22.0–30.0)
CREATININE: 1.9 mg/dL — ABNORMAL HIGH (ref 0.70–1.30)
EGFR MDRD AF AMER: 43 mL/min/{1.73_m2} — ABNORMAL LOW (ref >=60)
EGFR MDRD NON AF AMER: 35 mL/min/{1.73_m2} — ABNORMAL LOW (ref >=60)
GLUCOSE RANDOM: 166 mg/dL (ref 65–179)
POTASSIUM: 5.1 mmol/L — ABNORMAL HIGH (ref 3.5–5.0)
PROTEIN TOTAL: 6.4 g/dL — ABNORMAL LOW (ref 6.5–8.3)

## 2017-04-25 LAB — CO2: Carbon dioxide:SCnc:Pt:Ser/Plas:Qn:: 25

## 2017-04-25 LAB — CBC W/ AUTO DIFF
BASOPHILS ABSOLUTE COUNT: 0 10*9/L (ref 0.0–0.1)
BASOPHILS RELATIVE PERCENT: 0.2 %
EOSINOPHILS ABSOLUTE COUNT: 0.1 10*9/L (ref 0.0–0.4)
EOSINOPHILS RELATIVE PERCENT: 0.7 %
HEMATOCRIT: 32.7 % — ABNORMAL LOW (ref 41.0–53.0)
HEMOGLOBIN: 10.5 g/dL — ABNORMAL LOW (ref 13.5–17.5)
LARGE UNSTAINED CELLS: 2 % (ref 0–4)
MEAN CORPUSCULAR HEMOGLOBIN CONC: 32.2 g/dL (ref 31.0–37.0)
MEAN CORPUSCULAR HEMOGLOBIN: 29.3 pg (ref 26.0–34.0)
MEAN CORPUSCULAR VOLUME: 90.8 fL (ref 80.0–100.0)
MEAN PLATELET VOLUME: 8.9 fL (ref 7.0–10.0)
MONOCYTES ABSOLUTE COUNT: 0.5 10*9/L (ref 0.2–0.8)
MONOCYTES RELATIVE PERCENT: 6.4 %
NEUTROPHILS RELATIVE PERCENT: 75.7 %
PLATELET COUNT: 220 10*9/L (ref 150–440)
RED CELL DISTRIBUTION WIDTH: 14 % (ref 12.0–15.0)
WBC ADJUSTED: 7.1 10*9/L (ref 4.5–11.0)

## 2017-04-25 LAB — PROTIME-INR: INR: 2.4

## 2017-04-25 LAB — PHOSPHORUS: Phosphate:MCnc:Pt:Ser/Plas:Qn:: 3.9

## 2017-04-25 LAB — MEAN CORPUSCULAR HEMOGLOBIN CONC: Lab: 32.2

## 2017-04-25 LAB — MAGNESIUM: Magnesium:MCnc:Pt:Ser/Plas:Qn:: 2.1

## 2017-04-25 LAB — GAMMA GLUTAMYL TRANSFERASE: Gamma glutamyl transferase:CCnc:Pt:Ser/Plas:Qn:: 160 — ABNORMAL HIGH

## 2017-04-25 LAB — BILIRUBIN DIRECT: Bilirubin.glucuronidated:MCnc:Pt:Ser/Plas:Qn:: 0.5 — ABNORMAL HIGH

## 2017-04-25 LAB — INR: Lab: 2.4

## 2017-04-25 MED FILL — UNIFINE PENTIPS 32GX4MM/32GX4MM/MISC: UNIFINE PENTIPS 32GX4MM/32GX4MM/MISC | 25 days supply | Qty: 100 | Fill #0

## 2017-04-25 MED FILL — SILDENAFIL CITRATE/20MG/TABS: SILDENAFIL CITRATE/20MG/TABS | 30 days supply | Qty: 180 | Fill #0

## 2017-04-25 NOTE — Unmapped (Signed)
Patient sick on Sunday with a temperature of 100.  Patient stayed in bed for two days. Patient temperature today 98.  Patient reporting decreased appetite, sore throat, runny nose, body aches for about two week now.  Confirmed he is drinking plenty of fluids (around 80-100 ounces of water).  Patient saw PCP last week for symptoms but no intervention at that time. ERCP scheduled for tomorrow.  Patient with melanoma cancer confirmed by biopsy. Patient to get a dermatologist for surgical removal. Reviewed with NP Martin-Velez patient advised to drink plenty of fluids after ERCP.  Also educated since patient is requiring ensure to switch to Nepro for lower potassium.  Educated to call me if he is bed bound agagin to be seen sooner due to risk of him becoming debilitated.  Also advised to call me if he does not start feeling better sooner.

## 2017-04-26 ENCOUNTER — Encounter
Admit: 2017-04-26 | Discharge: 2017-04-26 | Payer: MEDICARE | Attending: Critical Care Medicine | Primary: Critical Care Medicine

## 2017-04-26 ENCOUNTER — Ambulatory Visit: Admit: 2017-04-26 | Discharge: 2017-04-26 | Payer: MEDICARE

## 2017-04-26 DIAGNOSIS — K831 Obstruction of bile duct: Principal | ICD-10-CM

## 2017-04-26 LAB — CYCLOSPORINE, TROUGH: Lab: 163

## 2017-04-26 MED ORDER — LEVOFLOXACIN 500 MG TABLET
0 refills | 0 days
Start: 2017-04-26 — End: 2017-10-20

## 2017-04-26 MED ORDER — LEVOFLOXACIN 250 MG TABLET
ORAL_TABLET | Freq: Two times a day (BID) | ORAL | 0 refills | 0.00000 days | Status: CP
Start: 2017-04-26 — End: 2017-06-22

## 2017-04-26 MED ORDER — LEVOFLOXACIN 250 MG TABLET: 500 mg | tablet | Freq: Two times a day (BID) | 0 refills | 0 days | Status: AC

## 2017-04-27 MED FILL — LEVOFLOXACIN/500MG/TABS: LEVOFLOXACIN/500MG/TABS | 5 days supply | Qty: 5 | Fill #0

## 2017-04-27 NOTE — Unmapped (Addendum)
Per Dr. Matilde Haymaker patient to take Levaquin 500 mg BID x 5 days.  Automatic alert came up due to patient being on coumadin.  Will recheck PT/INR next week per NP Martin-Velez.  Called and spoke with wife that patient needs to pick up antibiotic, prebent falls due to risk of bleeding, and repeat labs on Monday.  She verbalized understanding.

## 2017-04-30 MED FILL — DIGOXIN/0.125MG/TABS: DIGOXIN/0.125MG/TABS | 30 days supply | Qty: 15 | Fill #4

## 2017-05-01 ENCOUNTER — Ambulatory Visit
Admit: 2017-05-01 | Discharge: 2017-05-01 | Payer: MEDICARE | Attending: Transplant Surgery | Primary: Transplant Surgery

## 2017-05-01 ENCOUNTER — Ambulatory Visit: Admit: 2017-05-01 | Discharge: 2017-05-01 | Payer: MEDICARE

## 2017-05-01 DIAGNOSIS — K8050 Calculus of bile duct without cholangitis or cholecystitis without obstruction: Principal | ICD-10-CM

## 2017-05-01 DIAGNOSIS — Z944 Liver transplant status: Principal | ICD-10-CM

## 2017-05-01 DIAGNOSIS — K769 Liver disease, unspecified: Secondary | ICD-10-CM

## 2017-05-01 DIAGNOSIS — Z5181 Encounter for therapeutic drug level monitoring: Secondary | ICD-10-CM

## 2017-05-01 DIAGNOSIS — Z7901 Long term (current) use of anticoagulants: Secondary | ICD-10-CM

## 2017-05-01 DIAGNOSIS — Z79899 Other long term (current) drug therapy: Secondary | ICD-10-CM

## 2017-05-01 LAB — LIPASE: Triacylglycerol lipase:CCnc:Pt:Ser/Plas:Qn:: 13 — ABNORMAL LOW

## 2017-05-01 LAB — RED CELL DISTRIBUTION WIDTH: Lab: 14.4

## 2017-05-01 LAB — CBC W/ AUTO DIFF
BASOPHILS ABSOLUTE COUNT: 0 10*9/L (ref 0.0–0.1)
BASOPHILS RELATIVE PERCENT: 0.1 %
EOSINOPHILS ABSOLUTE COUNT: 0 10*9/L (ref 0.0–0.4)
EOSINOPHILS RELATIVE PERCENT: 0.5 %
HEMATOCRIT: 29.3 % — ABNORMAL LOW (ref 41.0–53.0)
HEMOGLOBIN: 9.5 g/dL — ABNORMAL LOW (ref 13.5–17.5)
LARGE UNSTAINED CELLS: 2 % (ref 0–4)
LYMPHOCYTES RELATIVE PERCENT: 13.6 %
MEAN CORPUSCULAR HEMOGLOBIN CONC: 32.6 g/dL (ref 31.0–37.0)
MEAN CORPUSCULAR VOLUME: 90 fL (ref 80.0–100.0)
MEAN PLATELET VOLUME: 8.2 fL (ref 7.0–10.0)
MONOCYTES ABSOLUTE COUNT: 0.3 10*9/L (ref 0.2–0.8)
MONOCYTES RELATIVE PERCENT: 7.4 %
NEUTROPHILS ABSOLUTE COUNT: 3.5 10*9/L (ref 2.0–7.5)
NEUTROPHILS RELATIVE PERCENT: 76.2 %
PLATELET COUNT: 175 10*9/L (ref 150–440)
RED BLOOD CELL COUNT: 3.25 10*12/L — ABNORMAL LOW (ref 4.50–5.90)
RED CELL DISTRIBUTION WIDTH: 14.4 % (ref 12.0–15.0)
WBC ADJUSTED: 4.6 10*9/L (ref 4.5–11.0)

## 2017-05-01 LAB — COMPREHENSIVE METABOLIC PANEL
ALBUMIN: 3.3 g/dL — ABNORMAL LOW (ref 3.5–5.0)
ALKALINE PHOSPHATASE: 138 U/L — ABNORMAL HIGH (ref 38–126)
AST (SGOT): 10 U/L — ABNORMAL LOW (ref 19–55)
BLOOD UREA NITROGEN: 27 mg/dL — ABNORMAL HIGH (ref 7–21)
BUN / CREAT RATIO: 16
CALCIUM: 10.2 mg/dL (ref 8.5–10.2)
CHLORIDE: 101 mmol/L (ref 98–107)
CO2: 25 mmol/L (ref 22.0–30.0)
CREATININE: 1.66 mg/dL — ABNORMAL HIGH (ref 0.70–1.30)
EGFR MDRD AF AMER: 50 mL/min/{1.73_m2} — ABNORMAL LOW (ref >=60)
EGFR MDRD NON AF AMER: 41 mL/min/{1.73_m2} — ABNORMAL LOW (ref >=60)
GLUCOSE RANDOM: 161 mg/dL — ABNORMAL HIGH (ref 65–99)
POTASSIUM: 4.6 mmol/L (ref 3.5–5.0)
PROTEIN TOTAL: 5.8 g/dL — ABNORMAL LOW (ref 6.5–8.3)
SODIUM: 135 mmol/L (ref 135–145)

## 2017-05-01 LAB — PHOSPHORUS: Phosphate:MCnc:Pt:Ser/Plas:Qn:: 2.9

## 2017-05-01 LAB — GAMMA GLUTAMYL TRANSFERASE: Gamma glutamyl transferase:CCnc:Pt:Ser/Plas:Qn:: 117 — ABNORMAL HIGH

## 2017-05-01 LAB — BILIRUBIN DIRECT: Bilirubin.glucuronidated:MCnc:Pt:Ser/Plas:Qn:: 0.4

## 2017-05-01 LAB — MAGNESIUM: Magnesium:MCnc:Pt:Ser/Plas:Qn:: 1.8

## 2017-05-01 LAB — BUN / CREAT RATIO: Urea nitrogen/Creatinine:MRto:Pt:Ser/Plas:Qn:: 16

## 2017-05-01 LAB — INR: Lab: 3.18

## 2017-05-01 LAB — PREALBUMIN: Prealbumin:MCnc:Pt:Ser/Plas:Qn:: 15.3 — ABNORMAL LOW

## 2017-05-01 NOTE — Unmapped (Signed)
TRANSPLANT SURGERY PROGRESS NOTE    Assessment and Plan  Jeffrey Ward is a 67 y.o. 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 He underwent an ERCP on 2/13 and was found to have a CBD stricture and one CBD stone. Symptoms prior to his procedure were likely secondary to choledocholithiasis. Following this procedure, his symptoms were consistent with post-ERCP pancreatitis. He is now slowly improving. Lipase today is 13.     - Encouraged patient to continue to hydrate well. Over the next few days, he should increase PO intake and continue to incorporate more supplements.  - Follow up on Thursday 2/21. If nutrition remains poor, may need to restart tube feeds. Will follow up pre-albumin sent today.    Subjective  Jeffrey Ward is a 67 y.o. 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     The patient's wife reports that 2 weeks ago the patient developed fever to 100, decreased appetite, and weakness. Labs at the time showed no leukocytosis, normal bilirubin, and mildly elevated transaminases. He underwent his previously scheduled ERCP on 2/13 which showed a single localized biliary stricture which was dilated and one CBD stone which was extracted. One covered stent was placed in the CBD. Following this procedure, he developed severe abdominal and back pain, nausea, vomiting, and poor PO intake. He became significantly weakened and fell twice on 2/14. He denies any dizziness, chest pain, SOB, or visual changes. He did not hit his head or have loss of consciousness. He states that he fell because of the profound  weakness in his legs and arms.    Over the weekend, he slightly improved. He was able to eat breakfast, but no other meals. He continues to hydrate well, drinking water throughout the day. He stayed in bed all day on 2/16, but was able to get out of bed yesterday as his strength has slightly improved. Wife reports glucose has been in the 100s.       Objective    Vitals:    05/01/17 1259   BP: 112/63   Pulse: 84   Temp: 36.9 ??C   TempSrc: Tympanic   Weight: 59.5 kg (131 lb 1.6 oz)   Height: 170.2 cm (5' 7.01)      Body mass index is 20.53 kg/m??.     Physical Exam:    General Appearance:   Frail appearing.   Lungs:                Clear to auscultation bilaterally  Abdomen:                Soft, RUQ and epigastric TTP, non-distended. Incision well healed. Mic-Key in place.  Extremities:              Warm and well perfused. No edema.      Data Review:  All lab results last 24 hours:    Recent Results (from the past 24 hour(s))   Comprehensive Metabolic Panel    Collection Time: 05/01/17 11:27 AM   Result Value Ref Range    Sodium  135 135 - 145 mmol/L    Potassium 4.6 3.5 - 5.0 mmol/L    Chloride 101 98 - 107 mmol/L    CO2 25.0 22.0 - 30.0 mmol/L    BUN 27 (H) 7 - 21 mg/dL    Creatinine 1.61 (H) 0.70 - 1.30 mg/dL    BUN/Creatinine Ratio 16     EGFR MDRD Non Af Amer 41 (L) >=60 mL/min/1.67m2    EGFR MDRD Af Amer 50 (L) >=60 mL/min/1.78m2    Anion Gap 9 9 - 15 mmol/L    Glucose 161 (H) 65 - 99 mg/dL    Calcium 09.6 8.5 - 04.5 mg/dL    Albumin 3.3 (L) 3.5 - 5.0 g/dL    Total Protein 5.8 (L) 6.5 - 8.3 g/dL    Total Bilirubin 0.6 0.0 - 1.2 mg/dL    AST 10 (L) 19 - 55 U/L    ALT 16 (L) 19 - 72 U/L    Alkaline Phosphatase 138 (H) 38 - 126 U/L   Bilirubin, Direct    Collection Time: 05/01/17 11:27 AM   Result Value Ref Range    Bilirubin, Direct 0.40 0.00 - 0.40 mg/dL   Phosphorus Level    Collection Time: 05/01/17 11:27 AM   Result Value Ref Range    Phosphorus 2.9 2.9 - 4.7 mg/dL   Magnesium Level    Collection Time: 05/01/17 11:27 AM   Result Value Ref Range    Magnesium 1.8 1.6 - 2.2 mg/dL   Gamma GT    Collection Time: 05/01/17 11:27 AM   Result Value Ref Range    GGT 117 (H) 12 - 109 U/L   PT-INR    Collection Time: 05/01/17 11:27 AM   Result Value Ref Range    PT 36.2 (H) 10.2 - 12.8 sec    INR 3.18    CBC w/ Differential    Collection Time: 05/01/17 11:27 AM   Result Value Ref Range    WBC 4.6 4.5 - 11.0 10*9/L    RBC 3.25 (L) 4.50 - 5.90 10*12/L    HGB 9.5 (L) 13.5 - 17.5 g/dL    HCT 40.9 (L) 81.1 - 53.0 %    MCV 90.0 80.0 - 100.0 fL    MCH 29.3 26.0 - 34.0 pg    MCHC 32.6 31.0 - 37.0 g/dL    RDW 91.4 78.2 - 95.6 %    MPV 8.2 7.0 - 10.0 fL    Platelet 175 150 - 440 10*9/L    Neutrophils % 76.2 %    Lymphocytes % 13.6 %    Monocytes % 7.4 %    Eosinophils % 0.5 %    Basophils % 0.1 %    Absolute Neutrophils 3.5 2.0 - 7.5 10*9/L    Absolute Lymphocytes 0.6 (L) 1.5 - 5.0 10*9/L    Absolute Monocytes 0.3 0.2 - 0.8 10*9/L    Absolute Eosinophils 0.0 0.0 - 0.4 10*9/L    Absolute Basophils 0.0 0.0 - 0.1 10*9/L    Large Unstained Cells 2 0 - 4 %

## 2017-05-01 NOTE — Unmapped (Addendum)
According to wife patient feeling unwell and has lost some weight.  Also fell at the house twice over the weekend and so wife did not allow patient to get out of bed.  Made her aware to bring patient in for labs and made a 12:45 pm apt. With Dr. Celine Mans.  Wife agreed to bring patient at that time.  Called Dr. Romie Minus local dermatologist [phone]) and left VM requesting medical records and procedure patient needs to be scheduled for for cancer removal on his nose per wife's request since she wants patient to be seen by a [facility] provider for this.

## 2017-05-01 NOTE — Unmapped (Signed)
Saw patient in clinic with Dr Raelene Bott did education about patient needing t drink Glucerna to help prevent need for tube feeds to be resumed per Dr. Raelene Bott.  Educated about the importance of talking to coordinator on-call for emergencies like when patient fell.  Confirmed patient will be returning for his appointments this Thursday with MRI, surgery, pharmacy, psychologist, and nutritionist.

## 2017-05-01 NOTE — Unmapped (Addendum)
Glucerna  Please obtain this nutritional supplement     For emergencies remember you can call 24/7 the hospital operator at [phone] and ask for the on-call liver transplant coordinator

## 2017-05-02 LAB — CYCLOSPORINE, TROUGH: Lab: 99

## 2017-05-03 NOTE — Unmapped (Signed)
Outpatient Adult Nutrition-Transplant Follow Up    Referring MD or Clinic: Liver Transplant Clinic    Reason for Referral: Follow-up  Other; s/p liver txp 09/15/16    PMH:   Patient Active Problem List   Diagnosis   ??? Hepatitis C   ??? Low back pain   ??? Hematuria   ??? Calculus of ureter   ??? Anemia   ??? Benign prostatic hyperplasia   ??? Chronic pain disorder   ??? Dysrhythmia   ??? History of gastroesophageal reflux (GERD)   ??? History of hepatitis C virus infection   ??? History of substance abuse   ??? Hyperglycemia, unspecified   ??? Intermittent vertigo   ??? Microscopic hematuria   ??? Postlaminectomy syndrome, lumbar region   ??? Thrombocytopenia (CMS-HCC)   ??? Vitamin D deficiency   ??? Atrial fibrillation with RVR (CMS-HCC)   ??? Secondary esophageal varices (CMS-HCC)   ??? Right ventricular dilation   ??? End-stage liver disease (CMS-HCC)   ??? HCC (hepatocellular carcinoma) (CMS-HCC)   ??? Paroxysmal atrial fibrillation (CMS-HCC)   ??? Hepatic encephalopathy (CMS-HCC)   ??? S/P liver transplant (CMS-HCC)   ??? Pulmonary hypertension, moderate to severe (CMS-HCC)   ??? Acute right heart failure (CMS-HCC)   ??? Tracheostomy dependent (CMS-HCC)   ??? Mixed level of activity delirium due to multiple etiologies, resolved   ??? Insomnia   ??? Peripheral neuropathy   ??? Influenza A     Anthropometric Data:  -- Height: 170.2 cm (5' 7.01)   -- Last recorded weight: 58.7 kg (129 lb 8 oz)  -- IBW: 67.23 kg  -- Percent IBW: ~87%  -- AdjIBW: N/A  -- BMI: Body mass index is 20.28 kg/m??.; WNL    -- Weight history: per EPIC wt hx, pt has experienced ~7.5% wt loss x ~1 month; significant  Wt Readings from Last 10 Encounters:   05/04/17 58.7 kg (129 lb 8 oz)   05/04/17 58.7 kg (129 lb 8 oz)   05/04/17 58.7 kg (129 lb 8 oz)   05/01/17 59.5 kg (131 lb 1.6 oz)   04/26/17 60.3 kg (133 lb)   04/21/17 60.4 kg (133 lb 1.6 oz)   04/17/17 61.6 kg (135 lb 12.8 oz)   04/11/17 61.6 kg (135 lb 12.9 oz)   04/10/17 61.2 kg (135 lb)   03/28/17 63.5 kg (140 lb)     Nutrition-Focused Physical Findings:   Unable to complete NFPE 2/2 pt discomfort - suspect muscle/fat loss has worsened since last assessment.   Fat Areas Examined  Orbital: Mild/moderate loss  Upper Arm: Mild/moderate loss????????????????????????????????????????????  ??  Muscle Areas Examined  Temple: Severe loss  Clavicle: Severe loss  Acromion: Severe loss  Scapular: Severe loss  Anterior Thigh: Mild/moderate loss  Posterior Calf: Mild/moderate loss  ??  ??  ??  Nutrition Evaluation  Overall Impressions: Moderate fat loss;Severe muscle loss (10/25/16 1503)    Relevant Medications, Herbs, Supplements include: Vit D3, insulin, magnesium plus protein, warfarin    Relevant Labs: reviewed    Physical Activity: minimal; pt has been bed-bound for ~1 week    Dietary Restrictions, Intolerances: n/a     Allergies: No Known Allergies    Hunger and Satiety: poor    24-Hour recall/usual intake:   1st - wife prepares 3 slices bacon or sausage links (pt does not consume full portion), 1 egg w/ cheese  Snack - n/a  2nd - skips; Glucerna  Snack - n/a; pt refuses snacks but will eat a rice krispies  treat  3rd - meat (tenderloin), baked potato or mashed potatoes, vegetable   Snack - n/a; occasional Glucerna  Beverages - water    Nutrition History: pt reports feeling sluggish and weak, especially over the past ~1 week. He has fallen twice and is now using a bedside commode 2/2 inability to reach the bathroom. He has been vomiting frequently, usually after an episode of intense coughing. Wife reports he now coughs every time he takes liquids or solids PO. He has not had a repeat swallow eval. While 3 Glucerna per day was recommended during recent clinic visit, pt is drinking 2 maximum. He is amenable to drinking 3 of the Glucerna or [employer] Breakfast (light start). Understands that if he cannot maintain weight or consume supplements, he will likely need to resume bolus tube feeds via G-tube.    Social: accompanied by wife     Estimated Needs:   Estimated Energy Needs: [phone] kcal/day (30-35 kcal/kg actual BW for weight gain)  Estimated Protein Needs:  70-88 g pro/day (1.2-1.5 g/kg actual BW)  Estimated Fluid Needs:  per MD    Nutrition Assessment: intake is inadequate to meet calorie/protein needs. Pt has suppressed appetite and is vomiting frequently after meals, usually after episodes of intense coughing. He would benefit from a swallow evaluation; suspect dysphagia. If deemed safe to swallow, encourage intake of 3 ONS per day (Glucerna, CIB Light Start) in addition to regular meals. If unable to consume, resume G-tube feeds with Osmolite 1.5.     Malnutrition Assessment using AND/ASPEN Clinical Characteristics:    Severe Protein-Calorie Malnutrition in the context of acute illness or injury (05/11/17 0835)     Energy Intake: < or equal to 50% of estimated energy requirement of > or equal to 5 days  Interpretation of Wt. Loss: > 5% x 1 month  Fat Loss: Moderate  Muscle Loss: Severe                Nutrition Goals:   1. Meet estimated daily needs  2. Balanced macronutrient intake  3. Prevent wt loss  4. Tolerate diet w/o texture & consistency restrictions    Interventions:  1. Swallow eval for possible dysphagia  2. Eat at least 3 meals per day  3. Consume 3 Glucerna or CIB Light Start per day  4. If unable to consume supplements/>50% of meals, bolus with 1 can of Osmolite 1.5    Materials Provided were:  Tips and suggestions     Follow-up: with next clinic viist    Length of visit was: 20 minutes    Jackqulyn Livings MPH, RD, LDN  Pager: [phone]

## 2017-05-03 NOTE — Unmapped (Signed)
Per Dr Raelene Bott will see what CSA level is tomorrow when he comes for his appointments.

## 2017-05-04 ENCOUNTER — Ambulatory Visit: Admit: 2017-05-04 | Discharge: 2017-05-04 | Payer: MEDICARE | Attending: Clinical | Primary: Clinical

## 2017-05-04 ENCOUNTER — Ambulatory Visit: Admit: 2017-05-04 | Discharge: 2017-05-04 | Payer: MEDICARE | Attending: Registered" | Primary: Registered"

## 2017-05-04 ENCOUNTER — Ambulatory Visit
Admit: 2017-05-04 | Discharge: 2017-05-04 | Payer: MEDICARE | Attending: Pharmacist Clinician (PhC)/ Clinical Pharmacy Specialist | Primary: Pharmacist Clinician (PhC)/ Clinical Pharmacy Specialist

## 2017-05-04 ENCOUNTER — Ambulatory Visit
Admit: 2017-05-04 | Discharge: 2017-05-04 | Payer: MEDICARE | Attending: Student in an Organized Health Care Education/Training Program | Primary: Student in an Organized Health Care Education/Training Program

## 2017-05-04 ENCOUNTER — Ambulatory Visit: Admit: 2017-05-04 | Discharge: 2017-05-04 | Payer: MEDICARE

## 2017-05-04 ENCOUNTER — Ambulatory Visit: Admit: 2017-05-04 | Discharge: 2017-05-05 | Payer: MEDICARE

## 2017-05-04 DIAGNOSIS — F321 Major depressive disorder, single episode, moderate: Principal | ICD-10-CM

## 2017-05-04 DIAGNOSIS — Z944 Liver transplant status: Principal | ICD-10-CM

## 2017-05-04 DIAGNOSIS — I50811 Acute right heart failure: Principal | ICD-10-CM

## 2017-05-04 DIAGNOSIS — Z79899 Other long term (current) drug therapy: Secondary | ICD-10-CM

## 2017-05-04 DIAGNOSIS — R1319 Other dysphagia: Principal | ICD-10-CM

## 2017-05-04 DIAGNOSIS — C220 Liver cell carcinoma: Secondary | ICD-10-CM

## 2017-05-04 DIAGNOSIS — Z7901 Long term (current) use of anticoagulants: Secondary | ICD-10-CM

## 2017-05-04 DIAGNOSIS — Z5181 Encounter for therapeutic drug level monitoring: Secondary | ICD-10-CM

## 2017-05-04 DIAGNOSIS — J3800 Paralysis of vocal cords and larynx, unspecified: Secondary | ICD-10-CM

## 2017-05-04 DIAGNOSIS — K769 Liver disease, unspecified: Secondary | ICD-10-CM

## 2017-05-04 LAB — CBC W/ AUTO DIFF
BASOPHILS ABSOLUTE COUNT: 0 10*9/L (ref 0.0–0.1)
BASOPHILS RELATIVE PERCENT: 0.3 %
EOSINOPHILS ABSOLUTE COUNT: 0 10*9/L (ref 0.0–0.4)
EOSINOPHILS RELATIVE PERCENT: 0.7 %
HEMATOCRIT: 27.8 % — ABNORMAL LOW (ref 41.0–53.0)
HEMOGLOBIN: 9.5 g/dL — ABNORMAL LOW (ref 13.5–17.5)
LARGE UNSTAINED CELLS: 2 % (ref 0–4)
LYMPHOCYTES ABSOLUTE COUNT: 1 10*9/L — ABNORMAL LOW (ref 1.5–5.0)
LYMPHOCYTES RELATIVE PERCENT: 17.1 %
MEAN CORPUSCULAR HEMOGLOBIN CONC: 34 g/dL (ref 31.0–37.0)
MEAN CORPUSCULAR HEMOGLOBIN: 29.9 pg (ref 26.0–34.0)
MEAN CORPUSCULAR VOLUME: 87.8 fL (ref 80.0–100.0)
MEAN PLATELET VOLUME: 9 fL (ref 7.0–10.0)
MONOCYTES ABSOLUTE COUNT: 0.3 10*9/L (ref 0.2–0.8)
MONOCYTES RELATIVE PERCENT: 5.6 %
NEUTROPHILS RELATIVE PERCENT: 74.5 %
PLATELET COUNT: 195 10*9/L (ref 150–440)
RED BLOOD CELL COUNT: 3.16 10*12/L — ABNORMAL LOW (ref 4.50–5.90)
RED CELL DISTRIBUTION WIDTH: 13.9 % (ref 12.0–15.0)
WBC ADJUSTED: 5.8 10*9/L (ref 4.5–11.0)

## 2017-05-04 LAB — COMPREHENSIVE METABOLIC PANEL
ALBUMIN: 3.2 g/dL — ABNORMAL LOW (ref 3.5–5.0)
ALKALINE PHOSPHATASE: 131 U/L — ABNORMAL HIGH (ref 38–126)
ALT (SGPT): 16 U/L — ABNORMAL LOW (ref 19–72)
ANION GAP: 13 mmol/L (ref 9–15)
AST (SGOT): 13 U/L — ABNORMAL LOW (ref 19–55)
BILIRUBIN TOTAL: 0.7 mg/dL (ref 0.0–1.2)
BLOOD UREA NITROGEN: 31 mg/dL — ABNORMAL HIGH (ref 7–21)
BUN / CREAT RATIO: 18
CHLORIDE: 99 mmol/L (ref 98–107)
CO2: 24 mmol/L (ref 22.0–30.0)
EGFR MDRD AF AMER: 49 mL/min/{1.73_m2} — ABNORMAL LOW (ref >=60)
EGFR MDRD NON AF AMER: 40 mL/min/{1.73_m2} — ABNORMAL LOW (ref >=60)
GLUCOSE RANDOM: 130 mg/dL — ABNORMAL HIGH (ref 65–99)
POTASSIUM: 4.5 mmol/L (ref 3.5–5.0)
PROTEIN TOTAL: 5.6 g/dL — ABNORMAL LOW (ref 6.5–8.3)
SODIUM: 136 mmol/L (ref 135–145)

## 2017-05-04 LAB — PHOSPHORUS: Phosphate:MCnc:Pt:Ser/Plas:Qn:: 3.5

## 2017-05-04 LAB — GAMMA GLUTAMYL TRANSFERASE: Gamma glutamyl transferase:CCnc:Pt:Ser/Plas:Qn:: 106

## 2017-05-04 LAB — BILIRUBIN DIRECT: Bilirubin.glucuronidated:MCnc:Pt:Ser/Plas:Qn:: 0.4

## 2017-05-04 LAB — MAGNESIUM: Magnesium:MCnc:Pt:Ser/Plas:Qn:: 2

## 2017-05-04 LAB — INR: Lab: 2.68

## 2017-05-04 LAB — CYCLOSPORINE, TROUGH: Lab: 125

## 2017-05-04 LAB — PROTIME-INR: PROTIME: 30.5 s — ABNORMAL HIGH (ref 10.2–12.8)

## 2017-05-04 LAB — GLUCOSE RANDOM: Glucose:MCnc:Pt:Ser/Plas:Qn:: 130 — ABNORMAL HIGH

## 2017-05-04 LAB — MONOCYTES RELATIVE PERCENT: Lab: 5.6

## 2017-05-04 MED FILL — NEORAL/100MG/CAP: NEORAL/100MG/CAP | 30 days supply | Qty: 120 | Fill #0

## 2017-05-04 MED FILL — WARFARIN/3MG/TAB: WARFARIN/3MG/TAB | 30 days supply | Qty: 30 | Fill #2

## 2017-05-04 NOTE — Unmapped (Signed)
[facility] Refill and Clinical Coordination Note  Medication(s): NEORAL 100MG    *SPOKE WITH PATIENT VIA RUTH-ANN IN CLINIC TODAY - ADDRESS AND MED INFO WAS VERIFIED PER PATIENT VIA CPP**    Jeffrey Ward, DOB: 07/14/1950  Phone: [phone] (home) , Alternate phone contact: N/A  Shipping address: [address]  [place] [address]  Phone or address changes today?: No  All above HIPAA information verified.  Insurance changes? No    Completed refill and clinical call assessment today to schedule patient's medication shipment from the [facility] [phone]).      MEDICATION RECONCILIATION    Confirmed the medication and dosage are correct and have not changed: No, patient reports changes to the regimen as follows: NOW ON 200MG  BID    Were there any changes to your medication(s) in the past month:  No, there are no changes reported at this time.    ADHERENCE    Is this medicine transplant or covered by Medicare Part B? Yes.    Neoral 100 mg   Quantity filled last month: 90   # of tablets left on hand: HAS 0 LEFT, HAS BEEN USING 25MG  CAPS BUT ONLY HAS 1 DAY LEFT OF THOSE      Did you miss any doses in the past 4 weeks? No missed doses reported.  Adherence counseling provided? Not needed     SIDE EFFECT MANAGEMENT    Are you tolerating your medication?:  Jeffrey Ward reports tolerating the medication.  Side effect management discussed: None      Therapy is appropriate and should be continued.    Evidence of clinical benefit: See Epic note from 05/01/17      FINANCIAL/SHIPPING    Delivery Scheduled: Yes, Expected medication delivery date: 05/05/17   Additional medications refilled: WARFARIN - VERIFIED STILL ON 3MG  DAILY PER RUTH ANN    The patient will receive an FSI print out for each medication shipped and additional FDA Medication Guides as required.  Patient education from [place] or Robet Leu may also be included in the shipment.    [place] did not have any additional questions at this time. Delivery address validated in FSI scheduling system: Yes, address listed above is correct.      We will follow up with patient monthly for standard refill processing and delivery.      Thank you,  Thad Ranger   [facility] Specialty Pharmacist

## 2017-05-04 NOTE — Unmapped (Signed)
Confidential Psychological Therapy Session  [facility]      Patient Name: Jeffrey Ward  Medical Record Number: [id number]  Date of Service: May 04, 2017  Attending Psychologist: Cherly Hensen, PhD  Time Spent: 60 min of face-to-face counseling  CPT Procedure Code: 56213 (60 min psychotherapy with patient and/or family)  Therapy Type: Behavior Modifying/Cognitive Behavioral Therapy (CBT)  Purpose of Treatment: reduce depression symptoms, reduce anxiety symptoms, improve quality of life, improve coping, adjustment to chronic medical illness  Evaluation Duration and Procedures:  Clinical interview; record review; case consultation    This note may contain sensitive and confidential information regarding the patient???s psychosocial adjustment to living with a chronic medical condition. DO NOT share this information outside [facility] without written consent from the patient explicitly stating that mental health records may be released.     Referral/Relevant History:  Mr.  Jeffrey Ward is a very pleasant 67 y.o.  male who presents for cognitive behavioral therapy to address depression following OLT.     Review of Symptoms/ROS: Deferred    Subjective: Pt received OLT in July 2018 and reported poor functioning since then. He has been readmitted multiple times and had the flu in Dec, leading to a large reduction in strength. Reported poor physical and emotional QOL, and does not want to live this way. Endorsed depressed and irritable mood, anhedonia, excessive sleep, poor appetite, weight loss (from 180# before OLT to 129# currently), fatigue. Denied SI, noting he has hope he will improve. Feels lied to about the benefits of transplant, and wonders if he would have chosen it if he knew then what he knows now. Feels misled. Angry he is not more capable, as he used to build race cars, build motorcycles, and go skydiving. Sets large goals and gets frustrated he cannot achieve them. Isolates self in home. Reasons for living include his wife--would never do that to her. Most frustrated by reduction in strength, pain, and voice. Does not take Tramadol because of fear of addiction. Regrets transplant, but knows he will recover eventually. Willing to take Cymbalta that was prescribed by team today.     He reports current level of pain (0=no pain;10=worst pain ever) to be 6/10.   He reports current mood (0=happy mood;10=very depressed) to be  4/10.  He reports current anxiety (0=no anxiety/calm; 10=very anxious) to be 0/10.   He reports current irritability (0=none; 10=severe) to be 6-7/10.     Objective: wife talkative, and participated mostly in session. Hard to refocus on pt repeatedly.     Mental Status Exam:  Appearance: used walker, thin, grimaced in pain  Motor: Psychomotor retardation  Speech/Language: soft, whisper  Mood: Irritable, depressed  Affect: Mood congruent  Thought Process: Logical, linear, clear, coherent, goal directed  Thought Content: Denies SI, HI, self harm, delusions, obsessions, paranoid ideation, or ideas of reference  Perceptual Disturbances: Denies auditory and visual hallucinations, behavior not concerning for response to internal stimuli  Orientation: Oriented to person, place, time, and general circumstances  Attention: Able to fully attend without fluctuations in consciousness  Concentration: Able to fully concentrate and attend  Memory: Immediate, short-term, long-term, and recall grossly intact  Fund of Knowledge: Consistent with level of education and development  Insight: Intact  Judgment: Intact  Impulse Control: Intact    Assessment:  Mr.  Jeffrey Ward participated well in this CBT session and exhibits good motivation towards treatment goals. Mr.  Jeffrey Ward remains clinically depressed and clinically anxious at this time with symptoms  in the moderate-severe range. Improvement in depression and anxiety symptoms can contribute to improved quality of life, functionality, and ability to cope with chronic illness.     [name] noted his main goal is to reduce pain, so education was provided on Tramadol and appropriate use.     #2 goal is to increase strength. He will re-engage with PT, and do exercises to goal rather than when he becomes frustrated and give up. He is able to recognize the difference.     #3 goal is to restore voice. He sees surgeon next month and is hopeful for resolution.     #4 goal is treatment of depression/irritability. Discussed behavioral activation; pt will make very small goals to work to achieve. Recognized he sets goals that are too high (building a race car) and will work in small increments (putting together one small part). Also agreed to leave the house to reduce isolation and try to re-connect with previous enjoyable activities--like wife driving him to the race track. Pt will also consider how he might want to organize his garage.     Supportive therapy provided, pt seemed to respond well to normalization and validating his regrets and feelings. Reviewed how pt's stubbornness is an asset in recovery. Wife optimistic and hopeful but also tended to minimize pt's concerns.     Focus on current treatment is treatment of depression/irritability.    Adherence concerns: fatigue interfering with treatment.    Diagnostic Impression: Major depressive disorder, moderate    Risk Assessment:  A suicide and violence risk assessment was performed as part of this evaluation. The patient is deemed to be at chronic elevated risk for self-harm/suicide given the following factors: male age >51, current diagnosis of depression and chronic severe medical condition. The patient is deemed to be at chronic elevated risk for violence given the following factors: male gender. These risk factors are mitigated by the following factors:lack of active SI/HI, no history of previous suicide attempts , no history of violence, motivation for treatment, utilization of positive coping skills, supportive family, sense of responsibility to family and social supports, presence of a significant relationship, presence of an available support system, expresses purpose for living, current treatment compliance, effective problem solving skills, safe housing, support system in agreement with treatment recommendations and presence of a safety plan with follow-up care. There is no acute risk for suicide or violence at this time. The patient was educated about relevant modifiable risk factors including following recommendations for treatment of psychiatric illness and abstaining from substance abuse.    While future psychiatric events cannot be accurately predicted, the patient does not currently require  acute inpatient psychiatric care and does not currently meet [facility] involuntary commitment criteria.      Psychometric Testing: Pt did not complete      Plan:  Mr.  Norden does not wish for a follow up app't (stated all psychiatrists are crazy), but wife reported perception of improved mood and motivation after this session and expressed thanks. Pt expressed commitment to follow through on goals discussed today. He will also take Tramadol as needed for pain, and will start Cymbalta prescribed today.    Mr.  Hunley was given this writer's business card with confidential voice mail number and instructed to call 911 for emergencies.     Should the patient or treatment team notice a change in functioning, please refer back to transplant psychology for further evaluation and treatment.        Neta Mends  Waynetta Sandy, Ph.D.  Transplant Psychologist  Associate Professor, [facility] Department of Surgery

## 2017-05-04 NOTE — Unmapped (Signed)
GENERATOR INFORMATION  Manufacter & Model #Device: Micra VR TCP  SN#: [id number]  ??  ??  LEAD INFORMATION  Abandoned/Epicardical Lead(s)   NO                                         TESTING:                                        Sensing (mV):                                         RV       PRE:    14.9     POST: 12.2  ??  Capture Threshold (V@_ms ):               ??  RV       PRE:0.5 V @ 0.24 ms POST: 0.5V @ 0.24 ms                                                                          Impedance (between 200-1500ohms):                                    ??  RV       PRE: 590      POST:500                                                        ??                                        MR CONDITIONAL STATUS AND MANAGEMENT  ??  MR CONDITIONAL SYSTEM    (Including Generator and all leads)  Risks and Potential Complications discussed with patient and patient consents to procedure  System has been implanted more than 6 weeks? Yes   Pacing-Dependent: No   (Dependent if underlying rhythm is less than 40 bpm, symptoms of presyncope, lightheadedness, or hemodynamic instability noted in the upright or supine position when tested)  VS: 70  ??  ??  ??  Programming Changes During MRI  Pacemaker Dependent:  NO  CIED was programmed to OVO MRI sure scan during MRI.  ??  ??  SETTINGS RESTORED TO PREVIOUS POST SCAN  ??  Follow-up advised in 3-6 months after MRI.

## 2017-05-04 NOTE — Unmapped (Signed)
Patient seen by nutrition and it was recommended patient get swallow study completed by speech.  Placed order per Dr. Matilde Haymaker and called to obtain same day study.  Speechtherpay requested barium swallow study be done and thathe can come tomorrow at noon for it.  Made patient and wife aware and they agreed to be seen then.  Patient reports feeling the same as he did on Monday but is drinking two supplements a day at this time. Is eating and then coughing so hard he vomits.

## 2017-05-04 NOTE — Unmapped (Signed)
FOLLOW UP CLINIC NOTE       Date of Service: 05/04/2017    Current complaint: Annual follow up for liver transplant      Assessment and Plan:   Jeffrey Ward is a 67 y.o. 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    - Plan for follow up in one year with CBC, CMP, CXR, and liver MR  - Liver MR read is pending, we will contact Jeffrey Ward if the results are abnormal.       History of Present Illness:   Jeffrey Ward is a 67 y.o. 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 He has had major changes to health in the past year, he had the flu in December, he is recovering from his transplant, he seems clearly depressed and still needs a walker to move, his liver numbers look good, and the MRI is pending.       Physical Exam:  BP 111/65 (BP Site: L Arm, BP Position: Sitting, BP Cuff Size: Large)  - Pulse 76  - Temp 36.2 ??C (97.2 ??F) (Tympanic)  - Resp 16  - Ht 170.2 cm (5' 7.01)  - Wt 58.7 kg (129 lb 8 oz)  - BMI 20.28 kg/m??   General Appearance:  No acute distress, well appearing and well nourished.   Head:  Normocephalic, atraumatic.   Eyes:  No scleral icterus.   Pulmonary:    Normal respiratory effort.   Cardiovascular:  Regular rate and rhythm.   Abdomen:   Abdominal scar well healed without hernia.    Neurologic: Non-focal exam.         Lab Results   Component Value Date    WBC 5.8 05/04/2017    HGB 9.5 (L) 05/04/2017    HCT 27.8 (L) 05/04/2017    PLT 195 05/04/2017     Lab Results   Component Value Date    NA 136 05/04/2017    K 4.5 05/04/2017    CL 99 05/04/2017    CO2 24.0 05/04/2017    BUN 31 (H) 05/04/2017    CREATININE 1.70 (H) 05/04/2017    CALCIUM 9.9 05/04/2017    MG 2.0 05/04/2017    PHOS 3.5 05/04/2017      Lab Results   Component Value Date    ALKPHOS 131 (H) 05/04/2017    BILITOT 0.7 05/04/2017    BILIDIR 0.40 05/04/2017    PROT 5.6 (L) 05/04/2017    ALBUMIN 3.2 (L) 05/04/2017    ALT 16 (L) 05/04/2017    AST 13 (L) 05/04/2017    GGT 106 05/04/2017      Lab Results   Component Value Date    APTT 35.2 02/28/2017 IMAGING:  No results found.

## 2017-05-05 ENCOUNTER — Ambulatory Visit
Admit: 2017-05-05 | Discharge: 2017-05-06 | Payer: MEDICARE | Attending: Speech-Language Pathologist | Primary: Speech-Language Pathologist

## 2017-05-05 ENCOUNTER — Ambulatory Visit: Admit: 2017-05-05 | Discharge: 2017-05-06 | Payer: MEDICARE

## 2017-05-05 DIAGNOSIS — R1310 Dysphagia, unspecified: Principal | ICD-10-CM

## 2017-05-05 DIAGNOSIS — R1319 Other dysphagia: Principal | ICD-10-CM

## 2017-05-05 DIAGNOSIS — J3800 Paralysis of vocal cords and larynx, unspecified: Secondary | ICD-10-CM

## 2017-05-05 NOTE — Unmapped (Signed)
[facility] PHARMACY NOTE  05/04/2017   Jeffrey Ward  [id number]    Medication changes today:   1. Added Cymbalta 30 mg nightly (currently on HOLD and based on psychology recommendations)    Education/Adherence tools provided today:  1.provided updated medication list  2. provided additional pill box education  3.  provided additional education on Lyrica    Follow up items:  1. F/u on BP and BG logs tomorrow    Next visit with pharmacy in PRN  ____________________________________________________________________    Jeffrey Ward is a 67 y.o. 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 Recent changes include 2 week history of abdominal pain, nausea, vomiting, and weakness. 2 weeks ago he had decreased appetite, weakness, and fever. Went through ERCP (endoscopy) on 02/13 and found to have gallstones in common bile duct. Had a stent placed in CBD. After this developed severe abdominal and back pain, nausea, vomiting, and poor PO intake. Became very sick on 02/14 and fell 2 times bc of weakness in legs and arms. Over weekend, slowly improving and eating/drinking more    Seen by pharmacy today for: medication management; last seen by pharmacy 3-4 months ago     CC:  Patient complains of chronic cough leading to nausea/vomiting      Vitals:    05/04/17 1148   BP: 111/65   Pulse: 76   Resp: 16   Temp: 36.3 ??C (97.3 ??F)       No Known Allergies    All medications reviewed and updated.     Medication list includes revisions made during today???s encounter    Outpatient Encounter Prescriptions as of 05/04/2017   Medication Sig Dispense Refill   ??? acetaminophen (TYLENOL) 325 MG tablet Take 1 tablet (325 mg total) by mouth every four (4) hours as needed for pain. 100 tablet 2   ??? blood sugar diagnostic (FREESTYLE TEST) Strp by Other route Four (4) times a day (before meals and nightly). 100 each 11   ??? blood-glucose meter (GLUCOSE MONITORING KIT) kit Use as instructed 1 each 0   ??? capsaicin (ZOSTRIX) 0.025 % cream Apply topically Two (2) times a day. 60 g 0   ??? cholecalciferol, vitamin D3, 2,000 unit cap 1 capsule (2,000 Units total) by PEG Tube route daily. (Patient taking differently: Take 2,000 Units by mouth daily. ) 30 capsule 11   ??? digoxin (LANOXIN) 125 mcg tablet 0.5 tablets (62.5 mcg total) by G-tube route daily. 15 tablet 11   ??? insulin ASPART (NOVOLOG FLEXPEN U-100 INSULIN) 100 unit/mL injection pen Inject 0.06 mL (6 Units total) under the skin Three (3) times a day before meals. 4 mL 11   ??? insulin ASPART (NOVOLOG FLEXPEN) 100 unit/mL injection pen Additional mealtime per SSI-  151-200= 2 units,  201-250= 4 units, 251-300=add 6 units, 301-350=8 units, 351-400=10 units, >400 =12 units 1.8 mL 0   ??? insulin glargine (LANTUS) 100 unit/mL (3 mL) injection pen Inject 0.12 mL (12 Units total) under the skin nightly. 4 mL 11   ??? lancets Misc 1 each  by Miscellaneous route Four (4) times a day (before meals and nightly). 100 each 11   ??? MAGNESIUM, AMINO ACID CHELATE, 133 mg tablet TAKE 1 TABLET BY MOUTH TWICE DAILY 100 tablet PRN   ??? melatonin 3 mg Tab Take 1 tablet (3 mg total) by mouth nightly. 30 tablet 0   ??? mycophenolate (CELLCEPT) 250 mg capsule Take 2 capsules (500 mg total) by mouth Two (2) times a day. 360 capsule 3   ??? NEORAL 100 mg capsule Take 2 capsules (200 mg total) by mouth Two (2) times a day. 120 capsule 11   ??? omeprazole (PRILOSEC) 20 MG capsule Take 1 capsule (20 mg total) by mouth daily. 30 capsule 1   ??? pen needle, diabetic (ULTICARE PEN NEEDLE) 32 gauge x 5/32 Ndle 1 each by Miscellaneous route Four (4) times a day (before meals and nightly). 90 each 3   ??? traMADol (ULTRAM) 50 mg tablet Take 50 mg by mouth. Every 4 to 6 hours PRN     ??? warfarin (COUMADIN) 3 MG tablet Take 3 mg by mouth nightly.        Facility-Administered Encounter Medications as of 05/04/2017   Medication Dose Route Frequency Provider Last Rate Last Dose   ??? pregabalin (LYRICA) capsule 50 mg  50 mg Oral Nightly Oran Rein, MD           Induction agent : steroids only    CURRENT IMMUNOSUPPRESSION: cyclosporine 200 mg PO BID cyclosporine goal: 150-200   cellcept250  mg PO bid    steroid free     Patient complains of headache, tremors, n/v    IMMUNOSUPPRESSION DRUG LEVELS:  Lab Results   Component Value Date    TACROLIMUS 10.6 10/21/2016    TACROLIMUS 11.3 10/20/2016    TACROLIMUS 10.6 10/19/2016     Lab Results   Component Value Date    CYCLO 125 05/04/2017    CYCLO 99 05/01/2017    CYCLO 163 04/25/2017     No results found for: EVEROLIMUS  No results found for: SIROLIMUS    CSA level is accurate 12 hour trough    Graft function: stable  DSA: positive (HLA Class 1)  Biopsies to date: 11/07/16: liver (negative for rejection)  WBC/ANC:  wnl    Plan: Will maintain current immunosuppression. Patient ran out 100 mg CsA and has been using 25 mg capsules. Patient only took 100 mg of CsA 2 days ago for 2 doses and therefore his CsA levels might be a little low. F/u with CsA levels next visit and adjust accordingly.    ID prophylaxis:   CMV Status: D+/ R+, moderate risk . CMV prophylaxis: valganciclovir 450 mg daily x 3 months per protocol.  No results found for: CMVCP   Estimated Creatinine Clearance: 34 mL/min (A) (based on SCr of 1.7 mg/dL (H)).  PCP Prophylaxis: bactrim SS 1 tab MWF x 6 months.  Thrush:  completed in hospital  Patient is  off prophylaxis    Plan: prophylaxis completed. Continue to monitor.    CV Prophylaxis: asa 81 mg ,  The 10-year ASCVD risk score Denman George DC Jr., et al., 2013) is: 21.6%  Statin therapy: Indicated  Plan: Consider starting statin next visit once current regimen is under control. Continue to monitor     BP: Goal < 140/90. Clinic vitals reported above  Home BP ranges: 150-170s/60-70s. Patient did not bring BP logs to clinic  Current meds include: sildenafil 40 mg TID  Plan: f/u with  BP logs from home and see if any adjustments need to be made. Continue to monitor    Atrial Fibrillations:   Med:  warfarin 3 mg nightly, dogoxin 62.5 mcg daily   Ran out of 3 mg tablets and using three 1 mg tablets that patient had leftover. Getting refill today.   INR is 2.68 and is wnl  Plan: Continue to monitor    Anemia:  H/H:   Lab Results   Component Value Date    HGB 9.5 (L) 05/04/2017     Lab Results   Component Value Date    HCT 27.8 (L) 05/04/2017     Iron panel:  Lab Results   Component Value Date    IRON 169 (H) 11/24/2015    TIBC 190.8 (L) 11/24/2015    FERRITIN 286.0 11/24/2015     Lab Results   Component Value Date    LABIRON 89 (H) 11/24/2015     Prior ESA use: ntd  Plan:Continue to monitor.     DM:   Lab Results   Component Value Date    A1C 5.1 12/26/2016   . Goal A1c < 7  History of Dm? 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 No. Patient has PCP but was not managing BS at time  Currently on: Novolog 6 units before meals with sliding scale, lantus 12 units nightly  Patient glucose level today was 130. Did not bring BG logs today  Diet: Usually eats 2 meals/day with no limitations. Is coughing a lot so hard to keep food down. Drinks 5-6 bottles of water/day.   Hypoglycemia: Had 1 reading in the 30s but managed with drinking orange juice  Plan: Continue to monitor. F/u with BG logs and see if any adjustments should be made.     Electrolytes: wnl  Meds currently on: Mag 133 mg BID  Plan: Continue to monitor     GI/BM: Pt reports 1-2BM. Is having n/v due to consistent cough. No heartburn.   Meds currently on: omeprazole 20 mg daily  Plan: Continue to monitor    Pain: pt reports lower back pain which he uses tylenol twice daily to help with. Does not use the tramadol (maybe once weekly). He is also having tingling in his feet and feels like the pain is not going away. Asked if he can try Lyrica for pain.  Meds currently on: APAP 325 mg PRN, tramadol 50 mg PRN  Plan: Prescribed Cymbalta 30 mg nightly to help with neuropathic pain and depression. It is currently on hold until patient talks to the psychologist. Continue to monitor    Depression  Patient is looking depressed and does not want to talk to the doctors. He does not seem as cheerful as he usually does.  Meds: None  Plan: Consult with the psychologist and see if Cymbalta will help him with the depression. Continue to monitor.    Bone health:   Vitamin D Level: last level 34.2 (12/26/16). Goal > 30.   Last DEXA results: 11/30/15, scan indicated low bone density   Current meds include: cholecalciferol 2,000 units daily   Plan: Vitamin D level within goal, continue supplementation. F/u with another vitamin D level to see what level is now.    Women's/Men's Health:  Jeffrey Ward is a 67 y.o. male. Patient reports no men's/women's health issues  Plan: Continue to monitor    Adherence: Patient has average understanding of medications; was not able to independently identify  names/doses of immunosuppressants and OI meds.  Patient  does not fill their own pill box on a regular basis at home.  Patient brought medication card:yes  Pill box:was correct  Plan: provided basic adherence counseling/intervention    Insomnia:   Meds currently on: melatonin 3 mg per night. Patient reports constant headaches nightly.  Plan: Continue to monitor.    Spent approximately 30 minutes on educating this patient and greater than 50% was spent in direct face to face counseling regarding post transplant medication education. Questions and concerns were address to patient's satisfaction.    Patient was reviewed with Dr. Norma Fredrickson who was agreement with the stated plan:     During this visit, the following was completed:   BG log data assessment  BP log data assessment  Labs ordered and evaluated  complex treatment plan >1 DS   Patient education was completed for 6-10 minutes     All questions/concerns were addressed to the patient's satisfaction.  __________________________________________  Nicoletta Ba, PY2 PHARMD CANDIDATE  Noah Charon, PHARMD, CPP  [employer] [facility]  PAGER [phone]

## 2017-05-06 NOTE — Unmapped (Signed)
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 Doyce Loose  Reason for Referral: MBSS   Surgery date (if applicable): 09/15/16       Chief Complaint: dysphagia, dysphonia    SUBJECTIVE:  Alexavier Tsutsui is a 67 y.o. 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 He has had major changes to health in the past year, he had the flu in December, he is recovering from his transplant, he seems clearly depressed and still needs a walker to move, his liver numbers look good, and the MRI is pending. During his hospital stay following liver transplant, he was ventilated via tracheostomy tube for much of that time. He was decannulated in September before d/c home. 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     Today, he reported that he is eating a regular diet but he gets choked on both solids and liquids. PEG is still in place (originally placed July 2018) but he stopped all feedings and flushes in September 2018. He endorsed decreased appetite, weight loss, globus sensation (all times which is exacerbated with food), and xerostomia. Severely hoarse and breathy vocal quality with severely decreased intensity. He has seen voice specialist at [facility] for dysphonia in November 2018.     ASSESSMENT: Pt seen today for MBSS. He currently presents with an oropharyngeal swallowing mechanism that is grossly WFL. No laryngeal penetration or aspiration visualized on today's study. Oropharyngeal pumping mechanism and pharyngeal constriction WFL. Mild vallecula residual (solids > liquids) which is easily cleared with additional swallow. Otherwise, pharyngeal residual is unremarkable. 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 This results in mild backflow through the UES and residual within the upper esophagus.     Recommend continued regular solid diet with thin liquids following general aspiration precautions: upright with intake, single bites/sips, slow rate. Pt may benefit from further GI workup to address esophageal component. He may also benefit from continued RD services to discuss resuming feeds via G-tube if he cannot adequately aliment by mouth. Pt verbalized understanding of results and recommendations. All questions and concerns addressed.       Dysphagia Diagnosis: Within Functional Limits    PO Recommendations : Regular Solids (no restrictions), Medication Administration via PO    Recommended Compensatory Techniques : Slow rate, Upright 90 degrees, Small sips/bites    Prognosis:  Good            Positive Prognosis Rationale: symptom severity      Goals:  Patient and Family Goals: Improve vocal quality, improve dysphagia     LTG #1: Pt will tolerate least restrictive PO diet without overt s/sx aspiration.       Communication Preference: Verbal    Barriers to Learning: No Barriers            Prior treatment for referral reason: No    Pain?: No   Precautions: Aspiration (General)    Prior Function: Independent (Uses walker for ambulation.)      Past Medical History:   Diagnosis Date   ??? Cancer (CMS-HCC)    ??? Cirrhosis (CMS-HCC)    ??? Diabetes (  CMS-HCC)    ??? Hepatitis C 07/17/2012   ??? Liver disease    ??? Low back pain 07/17/2012   ??? Varices, esophageal (CMS-HCC)       Family History   Problem Relation Age of Onset   ??? Hypertension Mother    ??? Cirrhosis Neg Hx    ??? Liver cancer Neg Hx    ??? Anemia Neg Hx    ??? Cancer Neg Hx    ??? Diabetes Neg Hx    ??? Kidney disease Neg Hx    ??? Obesity Neg Hx    ??? Thyroid disease Neg Hx    ??? Osteoporosis Neg Hx    ??? Coronary artery disease Neg Hx    ??? Anesthesia problems Neg Hx      Past Surgical History:   Procedure Laterality Date   ??? ANKLE SURGERY     ??? BACK SURGERY     ??? BACK SURGERY     ??? CHG US GUIDE, TISSUE ABLATION N/A 01/22/2016    Procedure: ULTRASOUND GUIDANCE FOR, AND MONITORING OF, PARENCHYMAL TISSUE ABLATION;  Surgeon: Particia Nearing, MD;  Location: MAIN OR [facility];  Service: Transplant   ??? IR EMBOLIZATION ORGAN ISCHEMIA, TUMORS, INFAR  06/16/2016    IR EMBOLIZATION ORGAN ISCHEMIA, TUMORS, INFAR 06/16/2016 Ammie Dalton, MD IMG VIR H&V [facility]   ??? PR COLON CA SCRN NOT HI RSK IND N/A 02/27/2015    Procedure: COLOREC CNCR SCR;COLNSCPY NO;  Surgeon: Vonda Antigua, MD;  Location: GI PROCEDURES [facility];  Service: Gastroenterology   ??? PR ENDOSCOPIC ULTRASOUND EXAM N/A 02/27/2015    Procedure: UGI ENDO; W/ENDO ULTRASOUND EXAM INCLUDES ESOPHAGUS, STOMACH, &/OR DUODENUM/JEJUNUM;  Surgeon: Vonda Antigua, MD;  Location: GI PROCEDURES MEMORIAL [facility];  Service: Gastroenterology   ??? PR ERCP BALLOON DILATE BILIARY/PANC DUCT/AMPULLA EA N/A 02/10/2017    Procedure: ERCP;WITH TRANS-ENDOSCOPIC BALLOON DILATION OF BILIARY/PANCREATIC DUCT(S) OR OF AMPULLA, INCLUDING SPHINCTERECTOMY, WHEN PERFOREMD,EACH DUCT (81191);  Surgeon: Mayford Knife, MD;  Location: GI PROCEDURES MEMORIAL [facility];  Service: Gastroenterology   ??? PR ERCP REMOVE FOREIGN BODY/STENT BILIARY/PANC DUCT N/A 02/10/2017    Procedure: ENDOSCOPIC RETROGRADE CHOLANGIOPANCREATOGRAPHY (ERCP); W/ REMOVAL OF FOREIGN BODY/STENT FROM BILIARY/PANCREATIC DUCT(S);  Surgeon: Mayford Knife, MD;  Location: GI PROCEDURES [facility]ORIAL [facility];  Service: Gastroenterology   ??? PR ERCP STENT PLACEMENT BILIARY/PANCREATIC DUCT N/A 11/29/2016    Procedure: ENDOSCOPIC RETROGRADE CHOLANGIOPANCREATOGRAPHY (ERCP); WITH PLACEMENT OF ENDOSCOPIC STENT INTO BILIARY OR PANCREATIC DUCT;  Surgeon: Chriss Driver, MD;  Location: GI PROCEDURES MEMORIAL [facility];  Service: Gastroenterology   ??? PR ERCP STENT PLACEMENT BILIARY/PANCREATIC DUCT N/A 04/26/2017    Procedure: ENDOSCOPIC RETROGRADE CHOLANGIOPANCREATOGRAPHY (ERCP); WITH PLACEMENT OF ENDOSCOPIC STENT INTO BILIARY OR PANCREATIC DUCT;  Surgeon: Mayford Knife, MD;  Location: GI PROCEDURES [facility];  Service: Gastroenterology   ??? PR ERCP,W/REMOVAL STONE,BIL/PANCR DUCTS N/A 11/29/2016    Procedure: ERCP; W/ENDOSCOPIC RETROGRADE REMOVAL OF CALCULUS/CALCULI FROM BILIARY &/OR PANCREATIC DUCTS;  Surgeon: Chriss Driver, MD;  Location: GI PROCEDURES [facility]OR[facility];  Service: Gastroenterology   ??? PR ERCP,W/REMOVAL STONE,BIL/PANCR DUCTS N/A 02/10/2017    Procedure: ERCP; W/ENDOSCOPIC RETROGRADE REMOVAL OF CALCULUS/CALCULI FROM BILIARY &/OR PANCREATIC DUCTS;  Surgeon: Mayford Knife, MD;  Location: GI PROCEDURES MEMORIAL [facility];  Service: Gastroenterology   ??? PR ERCP,W/REMOVAL STONE,BIL/PANCR DUCTS N/A 04/26/2017    Procedure: ERCP; W/ENDOSCOPIC RETROGRADE REMOVAL OF CALCULUS/CALCULI FROM BILIARY &/OR PANCREATIC DUCTS;  Surgeon: Mayford Knife, MD;  Location: GI PROCEDURES MEMORIAL [facility];  Service: Gastroenterology   ???  PR INSER HEART TEMP PACER ONE CHMBR N/A 10/02/2016    Procedure: Tempoarary Pacemaker Insertion;  Surgeon: Meredith Leeds, MD;  Location: [facility];  Service: Cardiology   ??? PR LAP,DIAGNOSTIC ABDOMEN N/A 01/22/2016    Procedure: Laparoscopy, Abdomen, Peritoneum, & Omentum, Diagnostic, W/Wo Collection Specimen(S) By Brushing Or Washing;  Surgeon: Particia Nearing, MD;  Location: MAIN OR [facility];  Service: Transplant   ??? PR PLACE PERCUT GASTROSTOMY TUBE N/A 11/17/2016    Procedure: UGI ENDO; W/DIRECTED PLCMT PERQ GASTROSTOMY TUBE;  Surgeon: Cletis Athens, MD;  Location: GI PROCEDURES MEMORIAL [facility];  Service: Gastroenterology   ??? PR TRACHEOSTOMY, PLANNED N/A 09/29/2016    Procedure: TRACHEOSTOMY PLANNED (SEPART PROC);  Surgeon: Katherina Mires, MD;  Location: MAIN OR [facility];  Service: Trauma   ??? PR TRANSCATH INSERT OR REPLACE LEADLESS PM VENTR N/A 10/11/2016    Procedure: Pacemaker Implant/Replace Leadless;  Surgeon: Meredith Leeds, MD;  Location: [facility];  Service: Cardiology   ??? PR TRANSPLANT LIVER,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    Procedure: Liver Allotransplantation; Orthotopic, Partial Or Whole, From Cadaver Or Living Donor, Any Age;  Surgeon: Doyce Loose, MD;  Location: MAIN OR [facility];  Service: Transplant   ??? PR TRANSPLANT,PREP DONOR LIVER, WHOLE N/A 09/15/2016    Procedure: Rogelia Boga Std Prep Cad Donor Whole Liver Gft Prior Tnsplnt,Inc Chole,Diss/Rem Surr Tissu Wo Triseg/Lobe Splt;  Surgeon: Doyce Loose, MD;  Location: MAIN OR [facility];  Service: Transplant   ??? PR UPPER GI ENDOSCOPY,BIOPSY N/A 07/17/2012    Procedure: UGI ENDOSCOPY; WITH BIOPSY, SINGLE OR MULTIPLE;  Surgeon: Alba Destine, MD;  Location: GI PROCEDURES [facility]ORIAL [facility];  Service: Gastroenterology   ??? PR UPPER GI ENDOSCOPY,DIAGNOSIS N/A 02/04/2014    Procedure: UGI ENDO, INCLUDE ESOPHAGUS, STOMACH, & DUODENUM &/OR JEJUNUM; DX W/WO COLLECTION SPECIMN, BY BRUSH OR WASH;  Surgeon: Wilburt Finlay, MD;  Location: GI PROC[facility]URES [facility];  Service: Gastroenterology   ??? PR UPPER GI ENDOSCOPY,LIGAT VARIX N/A 11/05/2013    Procedure: UGI ENDO; W/BAND LIG ESOPH &/OR GASTRIC VARICES;  Surgeon: Wilburt Finlay, MD;  Location: GI PROCEDURES MEMOR[facility];  Service: Gastroenterology    No Known Allergies  Social History   Substance Use Topics   ??? Smoking status: Never Smoker   ??? Smokeless tobacco: Never Used      Comment: Smoked in high school for about 5 years.    ??? Alcohol use No      Current Outpatient Prescriptions   Medication Sig Dispense Refill   ??? acetaminophen (TYLENOL) 325 MG tablet Take 1 tablet (325 mg total) by mouth every four (4) hours as needed for pain. 100 tablet 2   ??? blood sugar diagnostic (FREESTYLE TEST) Strp by Other route Four (4) times a day (before meals and nightly). 100 each 11   ??? blood-glucose meter (GLUCOSE MONITORING KIT) kit Use as instructed 1 each 0   ??? capsaicin (ZOSTRIX) 0.025 % cream Apply topically Two (2) times a day. 60 g 0   ??? cholecalciferol, vitamin D3, 2,000 unit cap 1 capsule (2,000 Units total) by PEG Tube route daily. (Patient taking differently: Take 2,000 Units by mouth daily. ) 30 capsule 11   ??? digoxin (LANOXIN) 125 mcg tablet 0.5 tablets (62.5 mcg total) by G-tube route daily. 15 tablet 11   ??? insulin ASPART (NOVOLOG FLEXPEN U-100 INSULIN) 100 unit/mL injection pen Inject 0.06 mL (6 Units total) under the skin Three (3) times a day before meals. 4 mL 11   ???  insulin ASPART (NOVOLOG FLEXPEN) 100 unit/mL injection pen Additional mealtime per SSI-  151-200= 2 units,  201-250= 4 units, 251-300=add 6 units, 301-350=8 units, 351-400=10 units, >400 =12 units 1.8 mL 0   ??? insulin glargine (LANTUS) 100 unit/mL (3 mL) injection pen Inject 0.12 mL (12 Units total) under the skin nightly. 4 mL 11   ??? lancets Misc 1 each by Miscellaneous route Four (4) times a day (before meals and nightly). 100 each 11   ??? MAGNESIUM, AMINO ACID CHELATE, 133 mg tablet TAKE 1 TABLET BY MOUTH TWICE DAILY 100 tablet PRN   ??? melatonin 3 mg Tab Take 1 tablet (3 mg total) by mouth nightly. 30 tablet 0   ??? mycophenolate (CELLCEPT) 250 mg capsule Take 2 capsules (500 mg total) by mouth Two (2) times a day. 360 capsule 3   ??? NEORAL 100 mg capsule Take 2 capsules (200 mg total) by mouth Two (2) times a day. 120 capsule 11   ??? omeprazole (PRILOSEC) 20 MG capsule Take 1 capsule (20 mg total) by mouth daily. 30 capsule 1   ??? pen needle, diabetic (ULTICARE PEN NEEDLE) 32 gauge x 5/32 Ndle 1 each by Miscellaneous route Four (4) times a day (before meals and nightly). 90 each 3   ??? traMADol (ULTRAM) 50 mg tablet Take 50 mg by mouth. Every 4 to 6 hours PRN     ??? warfarin (COUMADIN) 3 MG tablet Take 3 mg by mouth nightly.        Current Facility-Administered Medications   Medication Dose Route Frequency Provider Last Rate Last Dose   ??? pregabalin (LYRICA) capsule 50 mg  50 mg Oral Nightly Oran Rein, MD             Objective Swallow  Cognitive-Communication Status : WFL, responds to basic questions and follows basic commands  Dysphagia History: Pt with PEG in place however hasn't used it (even flushing) since September 2018. Aliments fully PO though endorses globus, coughing/choking with both solids/liquids, xerostomia, and decreased appetite. He reports decline since he had the flu.   Diet Prior to this Study: Unrestricted (includes meats and breads)  Respiratory Status : Room air  Positioning : Upright in chair  Secretions : Manages independently    Presentation Methods Used During Study  Bolus Presentation : Straw Sip, Fed self, Spoon    Thin Liquids   Oral Stage: WFL  Swallow Initiation : WFL  Nasopharyngeal Reflux : None noted  Pharyngeal Stage : WFL  Penetration Aspiration Score: 1 - Material does not enter airway  Aspiration Volume : None  Esophageal Phase Screening : UES Opening ineffective, Retrograde movement of bolus noted, Mild backflow noted    Puree  Oral Stage : WFL  Swallow Initiation : WFL  Nasopharyngeal Reflux: None noted  Pharyngeal Stage : WFL  Penetration Aspiration Score: 1 - Material does not enter airway  Aspiration Volume : None  Esophageal Phase Screening : UES Opening ineffective, Retrograde movement of bolus noted, Mild backflow noted    Solids   Oral Stage : WFL  Swallow Initiation : WFL  Nasopharyngeal Reflux: None noted  Pharyngeal Stage : WFL  Penetration Aspiration Score: 1 - Material does not enter airway  Aspiration Volume : None  Esophageal Phase Screening : UES Opening ineffective, Retrograde movement of bolus noted, Mild backflow noted    Education Provided: Patient, Safe swallowing strategies    Education Understood: Understanding verbalized    Session Duration : 30    Today's  Charges (noted here with $$):     SLP Evaluations  $$ Motion Fluoro Swallow Eval- Modified [mins]: 30     I attest that I have reviewed the above information.  SignedEliezer Mccoy, SLP  05/05/2017 5:13 PM

## 2017-05-06 NOTE — Unmapped (Signed)
Called Dr. Zonia Kief office [facility] [phone] and left 2nd VM with a request of records and an update of what patient needs to have done.  Also sent faxed letter requesting records.  Will enter in refferal for patient once I am made aware of what needs to be done.

## 2017-05-08 NOTE — Unmapped (Signed)
Received call back from central dermatology confirming patient does need Mohs surgery and was referred to [facility].  Made them aware patient wants to be seen here at [facility].  Stated they would send a referral to Dr. Lorn Junes with [facility].  Also sent a Mohs surgery referral to expedite this process for patient.

## 2017-05-09 NOTE — Unmapped (Signed)
[facility] Refill Coordination Note  Specialty Medication(s): Mycophenolate  Additional Medications shipped: na    Jeffrey Ward, DOB: 04/23/1950  Phone: [phone] (home) , Alternate phone contact: N/A  Phone or address changes today?: No  All above HIPAA information was verified with patient's family member.  Shipping Address: [address]  [place] [address]   Insurance changes? No    Completed refill call assessment today to schedule patient's medication shipment from the [facility] [phone]).      Confirmed the medication and dosage are correct and have not changed: Yes, regimen is correct and unchanged.    Confirmed patient started or stopped the following medications in the past month:  No, there are no changes reported at this time.    Are you tolerating your medication?:  Jeffrey Ward reports tolerating the medication.    ADHERENCE    (Below is required for Medicare Part B or Transplant patients only - per drug):     Mycophenolate Mofetil 250 mg   Quantity filled last month: 120   # of tablets left on hand: 40      Did you miss any doses in the past 4 weeks? No missed doses reported.    FINANCIAL/SHIPPING    Delivery Scheduled: Yes, Expected medication delivery date: Monday, March 4     The patient will receive an FSI print out for each medication shipped and additional FDA Medication Guides as required.  Patient education from [place] or Robet Leu may also be included in the shipment    [name] did not have any additional questions at this time.    Delivery address validated in FSI scheduling system: Yes, address listed in FSI is correct.    We will follow up with patient monthly for standard refill processing and delivery.      Thank you,  [name] Shared [facility] Specialty Pharmacist

## 2017-05-10 NOTE — Unmapped (Signed)
Returned wife VM, wife stated she received a letter yestarday ( 04/29/17) for her 05/04/17 appts and would like to know what she should do about it. Informed wife the letter was mailed to them on 03/27/17 so possibly the letter deliver was delayed, patient already completed 2/21 appts so disregard the letter. Wife stated verbal understanding.

## 2017-05-10 NOTE — Unmapped (Signed)
According to wife patient vomited on Sunday, Monday night, Tuesday morning.  Typically vomiting at night bile in appearance. Wife reported patient better today.  Sated he missed doses of medications last week when she went to [employer].  Wife now watches him to ensure he takes his medications.  Sent email to PharmD Nedra Hai to make aware of the following blood sugars:     Time 05/10/17 05/09/17 05/08/17 05/07/17 05/06/17 05/05/17 05/04/17   AM  302  136-after shaking and juice given  172 139 179 112 116   Noon  114 139 188 [phone]   PM  140 179 103 [phone]

## 2017-05-12 ENCOUNTER — Ambulatory Visit
Admit: 2017-05-12 | Discharge: 2017-05-30 | Payer: MEDICARE | Attending: Rehabilitative and Restorative Service Providers" | Primary: Rehabilitative and Restorative Service Providers"

## 2017-05-12 DIAGNOSIS — Z7409 Other reduced mobility: Secondary | ICD-10-CM

## 2017-05-12 DIAGNOSIS — M5442 Lumbago with sciatica, left side: Secondary | ICD-10-CM

## 2017-05-12 DIAGNOSIS — G6289 Other specified polyneuropathies: Secondary | ICD-10-CM

## 2017-05-12 DIAGNOSIS — Z9181 History of falling: Secondary | ICD-10-CM

## 2017-05-12 DIAGNOSIS — R29898 Other symptoms and signs involving the musculoskeletal system: Principal | ICD-10-CM

## 2017-05-12 MED FILL — MYCOPHENOLATE MOFETIL/250MG/CAPS: MYCOPHENOLATE MOFETIL/250MG/CAPS | 30 days supply | Qty: 120 | Fill #2

## 2017-05-13 NOTE — Unmapped (Addendum)
[facility] PT [facility]  OUTPATIENT PHYSICAL THERAPY  Treatment Note  05/12/2017    Patient Name: Jeffrey Ward  Date of Birth:1950/05/22  Session Number: 9  Diagnosis:   Encounter Diagnoses   Name Primary?   ??? Weakness of both lower extremities Yes   ??? Impaired functional mobility, balance, gait, and endurance    ??? Other polyneuropathy    ??? Risk for falls    ??? Midline low back pain with left-sided sciatica, unspecified chronicity        Onset of Symptoms: 10/31/16  Date of Evaluation: 01/31/17     Dates of Certification: 01/31/17 - 04/11/17; 04/18/17 - 06/13/17       Problem List: Decreased strength, Decreased range of motion, Decreased endurance, Impaired balance, Decreased mobility, Impaired sensation, Pain, Fall Risk, Gait deviation    ASSESSMENT:    Assessment: Pt is a 67 year old male being seen for deconditioning, balance/gait difficulties, and increased risk of falls following a liver transplant (July 2018). Past medical history also significant for cancer, peripheral neuropathy, hepatitis C, and heart failure. Pt arrived today using his rollator and ambulated >200??? with it before needing to sit down. Pt demonstrated improvements in SLS today, despite increased levels of fatigue this session. Pt needs skilled PT services to address B LE weakness and deficits in balance, gait, and endurance in order to promote safe participation in home and community activities.        Short Term Goals:  Patient/Family Goals: Improve strength, Improve ambulation, Improve balance    Short Term Goal 1: Pt will demonstrate independence in performance of HEP x 1 in clinic to maximize benefits of PT. GOAL MET (02/17/17)  Time Frame : 4 weeks   Short Term Goal 2: Pt will perform 5x STS with UE support in < 17 seconds, indicating improvement in functional mobility. GOAL MET (02/07/17)   Time Frame : 4 weeks   Short Term Goal 3: Pt will perform tandem stance > 10 seconds, indicating improved balance and reduced risk for falls GOAL MET (02/07/17)   Time Frame : 4 weeks                          Long Term Goals:  Long Term Goal 1: Pt will demonstrate strength gain of +1 per MMT for every LE muscle graded < 5/5, indicating improved strength for functional mobility.   Time Frame: 10 weeks   Long Term Goal 2: Pt will be able to stand up from a standard chair 5 times consecutively without use of UE support, indicating improvement in functional lower extremity muscle strength. GOAL MET (02/07/17) Revised Goal: Pt will demonstrate performance of 5x STS in < 12 sec, indicating improved functional mobility and reduced risk for falls.   Time Frame: 10 weeks   Long Term Goal 3: Pt will perform TUG in < 12 seconds, indicating reduced risk for falls. GOAL MET (03/28/17)   Time Frame: 10 weeks   Long Term Goal 4: (NEW GOAL added 02/24/17) Pt will be able to maintain SLS for at least 6.5 sec on each LE in order to demonstrate decrease in risk for falls. (GOAL MET 05/12/17)   Time Frame: 10 weeks               PLAN:  2x week   for Duration: 4 weeks         SUBJECTIVE:  Subjective : Pt reports that he has been in the hospital twice  since he was last here and that he had a stent put in his liver. He states that he has been using his rollator at home instead of his cane for the last few weeks. Pt also reports that he twisted his knee last week and it has bothered him ever since. Pt denies any falls.  Precautions: Cancer history, Fall risk       OBJECTIVE:      Pain: Yes  Location: L knee  Current Pain Level: 4      Pain Frequency: Constant/continuous  Pain aggravated by: walking, rising from sitting, on the move   Pain relieved by: medication     Pain Descriptors: Aching           Vital Signs:  Sitting: BP = 144/69 mmHg, HR = 68 bpm, O2 = 96%  On 02/07/17: Sitting after 15 minutes of activity = 137/116 mmHg, HR = 82 bpm (69 bpm at rest), 98% O2  Pt demonstrated SOB and fatigue during functional mobility, O2 Sat remained > 98% throughout session  On 03/28/17: Sitting after STS exercise = 135/88, HR = 93 bpm, O2 sat = 99%  ??  Special Cerebellar Tests:   F-N-F: NT  RAM (ankle DF): WNLs, symmetrical  H-S: NT  ??  Visual/Vestibular Testing: not indicated  ??  Cognitive Status/Communication: Patient is alert, oriented, and cooperative, and able to provide historical information. He has a weak, breathy voice, but his speech is easily intelligible.   ??  Posture/Observations:   Sitting: rounded shoulders and forward head, pt demonstrated sacral sitting  Standing: forward flexed posture, rounded shoulders and forward head  Skin assessment/Edema: dry skin, good color and warmth  ??  ??  Sensation:   5.07 Semmes-Weinstein Monofilament: diminished bilateral LE  Impaired  R foot = 7/10, R LE (ankle to knee) = 7/10  L foot = 7/10, L LE (ankle to knee) = 7/10  Pt reports diminished sensation L > R   ??  Proprioception:  Intact at the great toe bilaterally, with 9 out of 10 correct responses on the L and 10 out of 10 correct responses on the R.   ??  Reflexes:   Not Tested  ??  Range of Motion/Flexibility:   Within normal limits   ??  Strength/MMT:   UE muscle strength is generally in the 4/5 range   ??  LE MMT   ??  LE MMT  Left  Right    Hip flex: (L2)  4-/5  4/5    Hip abd:  3+/5  3+/5    Hip ext: -/5 -/5   Knee ext: (L3)  4-/5  4/5    Knee flex: (S2)  4/5  4/5    Ankle DF: (L4)  4-/5  4-/5    Ankle PF: (S1)  3-/5  2+/5    ??  Functional Mobility:  Pt is independent in all bed mobility and transfers, but performs them slowly. Pt is unable to come to standing from standard chair independently without use of UEs for support.  Rolling: independent  Supine to sit: independent  Sit to supine: independent  Sit to stand: modified independent with use of bilateral UE support  ??  Balance:   Patient able to maintain balance in sitting and in typical stance position independently without UE support. Able to maintain feet together and semi-tandem stance positions for >10 sec without UE support.  ??  Gait Analysis: decreased gait speed  decreased R step length  decreased  R toe clearance   Pt demonstrated decreased bilateral push-off, heel strike R > L, and step length  Patient ambulates with rollator   ??  Functional Test/Outcome Measures:  5XSTS: unable; pt completed modified testing with 5 stands in 20.9 sec using B UE support; on 02/07/17, 15.6 sec without UE support; on 02/17/17, best time was 17.2 sec without UE support; on 02/24/17, best time was 15.0 sec without UE support; on 03/28/17, best time = 13.4 sec without UE support    ??  TUG: 18.6 sec with rollator; on 02/17/17 = 12.7 sec with rollator, 13.6 sec without an AD; on 03/28/17 = 9.6 sec with SPC   ??  Tandem Stance: 1-2 sec with L foot forward; 4.9 sec with R foot forward, on 02/07/17: L foot forward = 14.2 sec, R foot forward = 27 sec; on 03/28/17: L foot forward = 15.3 sec, R foot forward = 20.7 sec  ??  SLS: unable; on 02/07/17: L foot = 1-2 sec, R foot = 4.6 sec; on 04/04/17: L foot = 5.4 sec, R foot = 1-2 sec; 05/12/17 = L foot = 7.9 sec, R foot = 8.4 sec  ??  Gait speed on 02/07/17: 0.71 m/s, pt performed with rollator on flat, even surface across 20' with supervision; on 02/14/17 = 0.73 m/sec average over ~300'; on 04/18/17 = 0.84 m/s    Education Provided: HEP  Education results: Verbalized understanding, demonstrated understanding       Total Time: 45    Today's Charges:  Treatment Rendered: 45 min    Neuromuscular Re-education: 45 min (3 units)  - Maintaining balance while standing on one foot and alternating cone taps; multiple reps with CGA; required seated rest breaks throughout  - Maintaining balance while side stepping and walking forward on foam balance beam without UE support, multiple reps, with CG to occasional min A; challenging activity for pt  - Ambulation with rollator >200', 50' with CGA; increased fatigue noted; BP 131/71 after ambulation, SpO2 94%, HR 62  - sit to stands with CGA; multiple repetitions; increased knee pain noted with exercise  - alternating toe taps on 6 step with CGA to occassional min A; challenging exercise for patient                        I attest that I have reviewed the above information.  Signed: Gracy Racer, SPT  05/12/2017 11:34 PM     I was physically present and immediately available to direct and supervise tasks that were related to patient management. The direction and supervision were continuous throughout the time these tasks were performed.     Signed: Sedalia Muta, PT  05/12/2017 11:36 PM

## 2017-05-17 ENCOUNTER — Ambulatory Visit: Admit: 2017-05-17 | Discharge: 2017-05-18 | Payer: MEDICARE

## 2017-05-17 DIAGNOSIS — K769 Liver disease, unspecified: Secondary | ICD-10-CM

## 2017-05-17 DIAGNOSIS — Z944 Liver transplant status: Principal | ICD-10-CM

## 2017-05-17 DIAGNOSIS — Z7901 Long term (current) use of anticoagulants: Secondary | ICD-10-CM

## 2017-05-17 DIAGNOSIS — Z79899 Other long term (current) drug therapy: Secondary | ICD-10-CM

## 2017-05-17 DIAGNOSIS — Z5181 Encounter for therapeutic drug level monitoring: Secondary | ICD-10-CM

## 2017-05-17 LAB — CBC W/ AUTO DIFF
BASOPHILS ABSOLUTE COUNT: 0 10*9/L (ref 0.0–0.1)
BASOPHILS RELATIVE PERCENT: 0.3 %
EOSINOPHILS RELATIVE PERCENT: 0.9 %
HEMATOCRIT: 31.5 % — ABNORMAL LOW (ref 41.0–53.0)
HEMOGLOBIN: 10.4 g/dL — ABNORMAL LOW (ref 13.5–17.5)
LARGE UNSTAINED CELLS: 3 % (ref 0–4)
LYMPHOCYTES ABSOLUTE COUNT: 0.8 10*9/L — ABNORMAL LOW (ref 1.5–5.0)
MEAN CORPUSCULAR HEMOGLOBIN CONC: 32.9 g/dL (ref 31.0–37.0)
MEAN CORPUSCULAR HEMOGLOBIN: 28.9 pg (ref 26.0–34.0)
MEAN CORPUSCULAR VOLUME: 88 fL (ref 80.0–100.0)
MEAN PLATELET VOLUME: 8.3 fL (ref 7.0–10.0)
MONOCYTES ABSOLUTE COUNT: 0.3 10*9/L (ref 0.2–0.8)
MONOCYTES RELATIVE PERCENT: 8.2 %
NEUTROPHILS ABSOLUTE COUNT: 2.5 10*9/L (ref 2.0–7.5)
NEUTROPHILS RELATIVE PERCENT: 67 %
RED BLOOD CELL COUNT: 3.58 10*12/L — ABNORMAL LOW (ref 4.50–5.90)
RED CELL DISTRIBUTION WIDTH: 14.7 % (ref 12.0–15.0)
WBC ADJUSTED: 3.7 10*9/L — ABNORMAL LOW (ref 4.5–11.0)

## 2017-05-17 LAB — COMPREHENSIVE METABOLIC PANEL
ALBUMIN: 3.4 g/dL — ABNORMAL LOW (ref 3.5–5.0)
ALKALINE PHOSPHATASE: 133 U/L — ABNORMAL HIGH (ref 38–126)
ALT (SGPT): 13 U/L — ABNORMAL LOW (ref 19–72)
ANION GAP: 8 mmol/L — ABNORMAL LOW (ref 9–15)
AST (SGOT): 14 U/L — ABNORMAL LOW (ref 19–55)
BILIRUBIN TOTAL: 0.7 mg/dL (ref 0.0–1.2)
BLOOD UREA NITROGEN: 35 mg/dL — ABNORMAL HIGH (ref 7–21)
BUN / CREAT RATIO: 21
CALCIUM: 10.2 mg/dL (ref 8.5–10.2)
CHLORIDE: 101 mmol/L (ref 98–107)
CO2: 26 mmol/L (ref 22.0–30.0)
CREATININE: 1.63 mg/dL — ABNORMAL HIGH (ref 0.70–1.30)
EGFR MDRD AF AMER: 51 mL/min/{1.73_m2} — ABNORMAL LOW (ref >=60)
GLUCOSE RANDOM: 147 mg/dL (ref 65–179)
POTASSIUM: 4.9 mmol/L (ref 3.5–5.0)
PROTEIN TOTAL: 6 g/dL — ABNORMAL LOW (ref 6.5–8.3)
SODIUM: 135 mmol/L (ref 135–145)

## 2017-05-17 LAB — NEUTROPHILS ABSOLUTE COUNT: Lab: 2.5

## 2017-05-17 LAB — PROTIME: Lab: 25.9 — ABNORMAL HIGH

## 2017-05-17 LAB — POTASSIUM: Potassium:SCnc:Pt:Ser/Plas:Qn:: 4.9

## 2017-05-17 LAB — GAMMA GLUTAMYL TRANSFERASE: Gamma glutamyl transferase:CCnc:Pt:Ser/Plas:Qn:: 87

## 2017-05-17 LAB — MAGNESIUM: Magnesium:MCnc:Pt:Ser/Plas:Qn:: 1.7

## 2017-05-17 LAB — PHOSPHORUS: Phosphate:MCnc:Pt:Ser/Plas:Qn:: 2.8 — ABNORMAL LOW

## 2017-05-17 LAB — PROTIME-INR: INR: 2.27

## 2017-05-17 LAB — BILIRUBIN DIRECT: Bilirubin.glucuronidated:MCnc:Pt:Ser/Plas:Qn:: 0.4

## 2017-05-17 NOTE — Unmapped (Signed)
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 Now with multiple masses that do not appear to be HCC.      PLAN: ultrasound guided liver biopsy

## 2017-05-17 NOTE — Unmapped (Signed)
Patient MRI discussed in hepatobiliary conference today.  Patient MRI shows 3 lesions which could be HCC versus PTLD versus fluid filled abscess.  Dr. Virl Son volunteered to complete a Ultrasound guided liver biopsy.  Dr. Matilde Haymaker stated he would call the patient to make aware.

## 2017-05-18 LAB — CYCLOSPORINE, TROUGH: Lab: 244

## 2017-05-18 NOTE — Unmapped (Signed)
Dr. Matilde Haymaker discussed need for US guided biopsy.

## 2017-05-19 MED ORDER — NEORAL 25 MG CAPSULE
ORAL | 11 refills | 0.00000 days | Status: CP
Start: 2017-05-19 — End: 2017-05-19

## 2017-05-19 MED ORDER — NEORAL 100 MG CAPSULE
ORAL_CAPSULE | ORAL | 11 refills | 0.00000 days | Status: CP
Start: 2017-05-19 — End: 2017-05-19

## 2017-05-19 MED ORDER — MIRTAZAPINE 15 MG TABLET: 15 mg | tablet | 1 refills | 0 days

## 2017-05-19 MED ORDER — MIRTAZAPINE 15 MG TABLET
ORAL_TABLET | Freq: Every evening | ORAL | 1 refills | 0.00000 days | Status: CP
Start: 2017-05-19 — End: 2017-05-19

## 2017-05-19 MED ORDER — NEORAL 25 MG CAPSULE: capsule | 11 refills | 0 days | Status: AC

## 2017-05-19 MED ORDER — NEORAL 25 MG CAPSULE: each | 11 refills | 0 days | Status: AC

## 2017-05-19 MED ORDER — NEORAL 100 MG CAPSULE: each | 11 refills | 0 days | Status: AC

## 2017-05-19 MED ORDER — NEORAL 100 MG CAPSULE: capsule | 11 refills | 0 days | Status: AC

## 2017-05-20 NOTE — Unmapped (Signed)
Per Marianna Fuss patient to HOLD coumadin from 3/9-3/13 and will consult with Dr. Matilde Haymaker about plan for 05/29/17 ERCP and coumadin dosing.  Patient referred to psychiatry due to depression.  For now per PharmD Nedra Hai started Remeron 15 mg nightly.  Patient now reliably taking Neoral so per PharmD Nedra Hai patient to reduce to Neoral 175 mg BID.  Patient wife verbalized understanding to this, Liver biopsy, and do half dose of lantus insulin the night before 05/24/17 biopsy.

## 2017-05-22 MED FILL — MIRTAZAPINE/15MG/TABS: MIRTAZAPINE/15MG/TABS | 30 days supply | Qty: 30 | Fill #0

## 2017-05-22 NOTE — Unmapped (Deleted)
[place] [facility] PT [facility]  OUTPATIENT PHYSICAL THERAPY     05/22/2017    Patient Name: Sadao Weyer  Date of Birth:08-Oct-1950  Session Number:    Diagnosis:   No diagnosis found.              ; requested recertification for 04/18/17 - 06/13/17            ASSESSMENT:      He has reached the end of current POC, but needs 6-8 additional visits over an 8-week period in order to achieve remaining goals.       Short Term Goals:                                                           Long Term Goals:                                                      PLAN:      for           SUBJECTIVE:             OBJECTIVE:                                            Vital Signs:  Sitting: BP = ***, HR = ***, ***% SpO2   After *** Activity: BP = ***, HR = ***, ***% SpO2   ??  Special Cerebellar Tests:   F-N-F: NT  RAM (ankle DF): WNLs, symmetrical  H-S: NT  ??  Visual/Vestibular Testing: not indicated  ??  Cognitive Status/Communication: Patient is alert, oriented, and cooperative, and able to provide historical information. He has a weak, breathy voice, but his speech is easily intelligible.   ??  Posture/Observations:   Sitting: rounded shoulders and forward head, pt demonstrated sacral sitting  Standing: forward flexed posture, rounded shoulders and forward head  Skin assessment/Edema: dry skin, good color and warmth  ??  ??  Sensation:   5.07 Semmes-Weinstein Monofilament: diminished bilateral LE  Impaired  R foot = 7/10, R LE (ankle to knee) = 7/10  L foot = 7/10, L LE (ankle to knee) = 7/10  Pt reports diminished sensation L > R   ??  Proprioception:  Intact at the great toe bilaterally, with 9 out of 10 correct responses on the L and 10 out of 10 correct responses on the R.   ??  Reflexes:   Not Tested  ??  Range of Motion/Flexibility:   Within normal limits   ??  Strength/MMT:   UE muscle strength is generally in the 4/5 range   ??  LE MMT   ??  LE MMT  Left  Right    Hip flex: (L2)  4-/5  4/5    Hip abd:  3+/5  3+/5    Hip ext: -/5 -/5   Knee ext: (L3)  4-/5  4/5    Knee flex: (S2)  4/5  4/5    Ankle DF: (L4)  4-/5  4-/5  Ankle PF: (S1)  3-/5  2+/5    ??  Functional Mobility:  Pt is independent in all bed mobility and transfers, but performs them slowly. Pt is unable to come to standing from standard chair independently without use of UEs for support.  Rolling: independent  Supine to sit: independent  Sit to supine: independent  Sit to stand: modified independent with use of bilateral UE support  ??  Balance:   Patient able to maintain balance in sitting and in typical stance position independently without UE support. Able to maintain feet together and semi-tandem stance positions for >10 sec without UE support.  ??  Gait Analysis:  decreased gait speed  decreased R step length  decreased R toe clearance   Pt demonstrated decreased bilateral push-off, heel strike R > L, and step length  Patient ambulates with rollator   ??  Functional Test/Outcome Measures:  5XSTS: unable; pt completed modified testing with 5 stands in 20.9 sec using B UE support; on 02/07/17, 15.6 sec without UE support; on 02/17/17, best time was 17.2 sec without UE support; on 02/24/17, best time was 15.0 sec without UE support; on 03/28/17, best time = 13.4 sec without UE support    ??  TUG: 18.6 sec with rollator; on 02/17/17 = 12.7 sec with rollator, 13.6 sec without an AD; on 03/28/17 = 9.6 sec with SPC   ??  Tandem Stance: 1-2 sec with L foot forward; 4.9 sec with R foot forward, on 02/07/17: L foot forward = 14.2 sec, R foot forward = 27 sec; on 03/28/17: L foot forward = 15.3 sec, R foot forward = 20.7 sec  ??  SLS: unable; on 02/07/17: L foot = 1-2 sec, R foot = 4.6 sec; on 04/04/17: L foot = 5.4 sec, R foot = 1-2 sec  ??  Gait speed on 02/07/17: 0.71 m/s, pt performed with rollator on flat, even surface across 20' with supervision; on 02/14/17 = 0.73 m/sec average over ~300'; on 04/18/17 = 0.84 m/s                    Today's Charges:  Treatment Rendered: 45 min    Neuromuscular Re-education: 35 min (2 units)  - Maintaining balance while standing on one foot and alternating cone taps; multiple reps with CGA  - Diagonal stepping to targets, multiple reps, CGA with no LOB; somewhat difficult activity for pt, but generally tolerated well  - Maintaining balance while side stepping and walking forward on foam balance beam without UE support, multiple reps, with CG to occasional min A; challenging activity for pt  - Negotiating obstacle course which included stepping on to and off of foam pad, stepping over 9-high hurdle, walking length of foam balance beam sideways, and stepping on one side of tilt board and taking small steps forward until board tipped to other side, multiple reps with CGA; challenging activity for pt, but generally performed well  - lateral stepping with squat; 2x12' with CGA to min A; challenging exercise for pt    Gait Training: 10 min (1 unit)  -Walking >300' outdoors on paved level surfaces and up/down curb using SPC with CGA; pt demonstrated good gait mechanics and walking speed during this activity today, with no instances of LOB                    I attest that I have reviewed the above information.  Signed: Lorenda Hatchet, PT  05/22/2017 10:11 PM

## 2017-05-22 NOTE — Unmapped (Signed)
Pre-procedure instructions completed. All questions answered. Instructed to take AM medications- coumadin hold since 3/8. Ok to resume 3/14. Take 1/2 lantus PM prior to procedure, hold AM insulin meal dose.

## 2017-05-22 NOTE — Unmapped (Signed)
Pt request for RX Refill

## 2017-05-24 ENCOUNTER — Ambulatory Visit: Admit: 2017-05-24 | Discharge: 2017-05-28 | Disposition: A | Discharge status: Home Health | Payer: MEDICARE

## 2017-05-24 DIAGNOSIS — J942 Hemothorax: Principal | ICD-10-CM

## 2017-05-24 DIAGNOSIS — Z8505 Personal history of malignant neoplasm of liver: Secondary | ICD-10-CM

## 2017-05-24 DIAGNOSIS — Z944 Liver transplant status: Principal | ICD-10-CM

## 2017-05-24 DIAGNOSIS — K769 Liver disease, unspecified: Secondary | ICD-10-CM

## 2017-05-24 DIAGNOSIS — Z7901 Long term (current) use of anticoagulants: Secondary | ICD-10-CM

## 2017-05-24 DIAGNOSIS — Z5181 Encounter for therapeutic drug level monitoring: Secondary | ICD-10-CM

## 2017-05-24 DIAGNOSIS — R935 Abnormal findings on diagnostic imaging of other abdominal regions, including retroperitoneum: Principal | ICD-10-CM

## 2017-05-24 DIAGNOSIS — Z79899 Other long term (current) drug therapy: Secondary | ICD-10-CM

## 2017-05-24 LAB — CBC W/ AUTO DIFF
BASOPHILS ABSOLUTE COUNT: 0 10*9/L (ref 0.0–0.1)
BASOPHILS ABSOLUTE COUNT: 0 10*9/L (ref 0.0–0.1)
BASOPHILS RELATIVE PERCENT: 0.6 %
BASOPHILS RELATIVE PERCENT: 0.1 %
EOSINOPHILS ABSOLUTE COUNT: 0.1 10*9/L (ref 0.0–0.4)
EOSINOPHILS ABSOLUTE COUNT: 0 10*9/L (ref 0.0–0.4)
EOSINOPHILS RELATIVE PERCENT: 1.2 %
HEMATOCRIT: 31.5 % — ABNORMAL LOW (ref 41.0–53.0)
HEMATOCRIT: 25.2 % — ABNORMAL LOW (ref 41.0–53.0)
HEMOGLOBIN: 10.2 g/dL — ABNORMAL LOW (ref 13.5–17.5)
HEMOGLOBIN: 8 g/dL — ABNORMAL LOW (ref 13.5–17.5)
LARGE UNSTAINED CELLS: 2 % (ref 0–4)
LARGE UNSTAINED CELLS: 1 % (ref 0–4)
LYMPHOCYTES ABSOLUTE COUNT: 0.8 10*9/L — ABNORMAL LOW (ref 1.5–5.0)
LYMPHOCYTES ABSOLUTE COUNT: 0.6 10*9/L — ABNORMAL LOW (ref 1.5–5.0)
LYMPHOCYTES RELATIVE PERCENT: 22.1 %
LYMPHOCYTES RELATIVE PERCENT: 12.3 %
MEAN CORPUSCULAR HEMOGLOBIN CONC: 32.5 g/dL (ref 31.0–37.0)
MEAN CORPUSCULAR HEMOGLOBIN CONC: 31.9 g/dL (ref 31.0–37.0)
MEAN CORPUSCULAR HEMOGLOBIN: 28.8 pg (ref 26.0–34.0)
MEAN CORPUSCULAR HEMOGLOBIN: 28.1 pg (ref 26.0–34.0)
MEAN CORPUSCULAR VOLUME: 88.1 fL (ref 80.0–100.0)
MEAN PLATELET VOLUME: 7.9 fL (ref 7.0–10.0)
MONOCYTES ABSOLUTE COUNT: 0.2 10*9/L (ref 0.2–0.8)
MONOCYTES ABSOLUTE COUNT: 0.3 10*9/L (ref 0.2–0.8)
MONOCYTES RELATIVE PERCENT: 5.9 %
MONOCYTES RELATIVE PERCENT: 5.1 %
NEUTROPHILS ABSOLUTE COUNT: 2.6 10*9/L (ref 2.0–7.5)
NEUTROPHILS ABSOLUTE COUNT: 4.1 10*9/L (ref 2.0–7.5)
NEUTROPHILS RELATIVE PERCENT: 68.3 %
NEUTROPHILS RELATIVE PERCENT: 80.8 %
PLATELET COUNT: 133 10*9/L — ABNORMAL LOW (ref 150–440)
RED BLOOD CELL COUNT: 3.55 10*12/L — ABNORMAL LOW (ref 4.50–5.90)
RED BLOOD CELL COUNT: 2.86 10*12/L — ABNORMAL LOW (ref 4.50–5.90)
RED CELL DISTRIBUTION WIDTH: 15.5 % — ABNORMAL HIGH (ref 12.0–15.0)
RED CELL DISTRIBUTION WIDTH: 14.8 % (ref 12.0–15.0)
WBC ADJUSTED: 3.8 10*9/L — ABNORMAL LOW (ref 4.5–11.0)
WBC ADJUSTED: 5.1 10*9/L (ref 4.5–11.0)

## 2017-05-24 LAB — CBC
HEMATOCRIT: 27.1 % — ABNORMAL LOW (ref 41.0–53.0)
HEMOGLOBIN: 8.9 g/dL — ABNORMAL LOW (ref 13.5–17.5)
MEAN CORPUSCULAR HEMOGLOBIN CONC: 32.7 g/dL (ref 31.0–37.0)
MEAN CORPUSCULAR HEMOGLOBIN: 28.6 pg (ref 26.0–34.0)
MEAN CORPUSCULAR VOLUME: 87.3 fL (ref 80.0–100.0)
MEAN PLATELET VOLUME: 8.1 fL (ref 7.0–10.0)
RED BLOOD CELL COUNT: 3.11 10*12/L — ABNORMAL LOW (ref 4.50–5.90)
RED CELL DISTRIBUTION WIDTH: 14.8 % (ref 12.0–15.0)
WBC ADJUSTED: 7.1 10*9/L (ref 4.5–11.0)

## 2017-05-24 LAB — GLUCOSE, BODY FLUID

## 2017-05-24 LAB — HYPOCHROMIA

## 2017-05-24 LAB — COMPREHENSIVE METABOLIC PANEL
ALBUMIN: 3.1 g/dL — ABNORMAL LOW (ref 3.5–5.0)
ALKALINE PHOSPHATASE: 154 U/L — ABNORMAL HIGH (ref 38–126)
ANION GAP: 6 mmol/L — ABNORMAL LOW (ref 9–15)
AST (SGOT): 12 U/L — ABNORMAL LOW (ref 19–55)
BILIRUBIN TOTAL: 0.4 mg/dL (ref 0.0–1.2)
BLOOD UREA NITROGEN: 40 mg/dL — ABNORMAL HIGH (ref 7–21)
BUN / CREAT RATIO: 28
CALCIUM: 10.4 mg/dL — ABNORMAL HIGH (ref 8.5–10.2)
CHLORIDE: 106 mmol/L (ref 98–107)
CO2: 26 mmol/L (ref 22.0–30.0)
CREATININE: 1.41 mg/dL — ABNORMAL HIGH (ref 0.70–1.30)
EGFR MDRD AF AMER: >=60 mL/min/{1.73_m2} (ref >=60)
EGFR MDRD NON AF AMER: 50 mL/min/{1.73_m2} — ABNORMAL LOW (ref >=60)
GLUCOSE RANDOM: 142 mg/dL (ref 65–179)
POTASSIUM: 4.8 mmol/L (ref 3.5–5.0)
PROTEIN TOTAL: 5.7 g/dL — ABNORMAL LOW (ref 6.5–8.3)
SODIUM: 138 mmol/L (ref 135–145)

## 2017-05-24 LAB — MAGNESIUM: Magnesium:MCnc:Pt:Ser/Plas:Qn:: 1.8

## 2017-05-24 LAB — PROTIME: Lab: 13 — ABNORMAL HIGH

## 2017-05-24 LAB — PHOSPHORUS: Phosphate:MCnc:Pt:Ser/Plas:Qn:: 3.5

## 2017-05-24 LAB — RED BLOOD CELL COUNT: Lab: 3.11 — ABNORMAL LOW

## 2017-05-24 LAB — EGFR MDRD AF AMER: Glomerular filtration rate/1.73 sq M.predicted.black:ArVRat:Pt:Ser/Plas/Bld:Qn:Creatinine-based formula (MDRD): >=60

## 2017-05-24 LAB — CYCLOSPORINE, TROUGH: Lab: 147

## 2017-05-24 LAB — GAMMA GLUTAMYL TRANSFERASE: Gamma glutamyl transferase:CCnc:Pt:Ser/Plas:Qn:: 91

## 2017-05-24 LAB — HEMOGLOBIN: Lab: 10.2 — ABNORMAL LOW

## 2017-05-24 LAB — BILIRUBIN DIRECT: Bilirubin.glucuronidated:MCnc:Pt:Ser/Plas:Qn:: <0.1

## 2017-05-24 LAB — GLUCOSE FLUID: Lab: 154

## 2017-05-24 MED ORDER — TRAMADOL 50 MG TABLET
ORAL_TABLET | Freq: Four times a day (QID) | ORAL | 0 refills | 0.00000 days | Status: CP | PRN
Start: 2017-05-24 — End: 2017-10-09

## 2017-05-24 MED ORDER — TRAMADOL 50 MG TABLET: tablet | 0 refills | 0 days

## 2017-05-24 MED FILL — SILDENAFIL CITRATE/20MG/TABS: SILDENAFIL CITRATE/20MG/TABS | 30 days supply | Qty: 180 | Fill #1

## 2017-05-24 MED FILL — DIGOXIN/0.125MG/TABS: DIGOXIN/0.125MG/TABS | 30 days supply | Qty: 15 | Fill #5

## 2017-05-24 NOTE — Unmapped (Signed)
PT transported to PRU via stretcher by Procedural RN. Report given to PRU RN. Pt placed on PRU vital sign monitors.

## 2017-05-24 NOTE — Unmapped (Signed)
Pt received from PRU. Report from PRU RN. Pt ID/Procedure/Consent/Allergies/Airway verified. Pt transferred to Procedure table. Pt Placed on Cardiac and vital sign monitors. Pt IV patency checked and placed on carrier fluid (NS). Pt placed on nasal cannula.

## 2017-05-24 NOTE — Unmapped (Signed)
[place] INTERVENTIONAL RADIOLOGY - Pre Procedure H/P      Assessment/Plan:    Jeffrey Ward is a 67 y.o. male who will undergo Targeted liver biopsy ( 2 lesions) in Interventional Radiology.    --This procedure has been fully reviewed with the patient/patient???s authorized representative. The risks, benefits and alternatives have been explained, and the patient/patient???s authorized representative has consented to the procedure.  --The patient will accept blood products in an emergent situation.  --The patient does not have a Do Not Resuscitate order in effect.      HPI: Jeffrey Ward is a 67 y.o. 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  He has 3 new liver lesions that do not appear to be HCC.  Plan for biopsy of 2 non similar appearing lesions.       Allergies: No Known Allergies    Medications:  Coumadin - last dose Friday 05/19/17 INR 1.14    ASA Grade: ASA 3 - Patient with moderate systemic disease with functional limitations    PE:    Vitals:    05/24/17 1020   BP: 117/94   Pulse: 59   Resp: 16   Temp: 36.3 ??C (97.4 ??F)     General: male in NAD.  Airway assessment: Class 2 - Can visualize soft palate and fauces, tip of uvula is obscured  Lungs: Respirations even and non labored    Alease Frame, FNP-C  05/24/2017 11:23 AM

## 2017-05-24 NOTE — Unmapped (Signed)
Per Dr. Matilde Haymaker patient to restart coumadin tomorrow and Friday hold Saturday and Sunday in preporation for the ERCP.  Wife verbalized understanding.  Stated he would refill tramadol.  PA Shurney agreed to e-script it.

## 2017-05-25 DIAGNOSIS — J942 Hemothorax: Principal | ICD-10-CM

## 2017-05-25 LAB — CHLORIDE: Chloride:SCnc:Pt:Ser/Plas:Qn:: 106

## 2017-05-25 LAB — COMPREHENSIVE METABOLIC PANEL
ALBUMIN: 2.7 g/dL — ABNORMAL LOW (ref 3.5–5.0)
ALBUMIN: 2.7 g/dL — ABNORMAL LOW (ref 3.5–5.0)
ALKALINE PHOSPHATASE: 83 U/L (ref 38–126)
ALKALINE PHOSPHATASE: 91 U/L (ref 38–126)
ALT (SGPT): 13 U/L — ABNORMAL LOW (ref 19–72)
ALT (SGPT): 17 U/L — ABNORMAL LOW (ref 19–72)
ANION GAP: 3 mmol/L — ABNORMAL LOW (ref 9–15)
ANION GAP: 5 mmol/L — ABNORMAL LOW (ref 9–15)
AST (SGOT): 22 U/L (ref 19–55)
AST (SGOT): 16 U/L — ABNORMAL LOW (ref 19–55)
BILIRUBIN TOTAL: 0.8 mg/dL (ref 0.0–1.2)
BILIRUBIN TOTAL: 0.7 mg/dL (ref 0.0–1.2)
BLOOD UREA NITROGEN: 33 mg/dL — ABNORMAL HIGH (ref 7–21)
BLOOD UREA NITROGEN: 31 mg/dL — ABNORMAL HIGH (ref 7–21)
BUN / CREAT RATIO: 28
BUN / CREAT RATIO: 28
CALCIUM: 8.8 mg/dL (ref 8.5–10.2)
CHLORIDE: 104 mmol/L (ref 98–107)
CHLORIDE: 106 mmol/L (ref 98–107)
CO2: 25 mmol/L (ref 22.0–30.0)
CO2: 23 mmol/L (ref 22.0–30.0)
CREATININE: 1.16 mg/dL (ref 0.70–1.30)
EGFR MDRD AF AMER: >=60 mL/min/{1.73_m2} (ref >=60)
EGFR MDRD AF AMER: >=60 mL/min/{1.73_m2} (ref >=60)
EGFR MDRD NON AF AMER: >=60 mL/min/{1.73_m2} (ref >=60)
GLUCOSE RANDOM: 185 mg/dL — ABNORMAL HIGH (ref 65–179)
GLUCOSE RANDOM: 169 mg/dL — ABNORMAL HIGH (ref 65–99)
POTASSIUM: 5.5 mmol/L — ABNORMAL HIGH (ref 3.5–5.0)
POTASSIUM: 5.1 mmol/L — ABNORMAL HIGH (ref 3.5–5.0)
PROTEIN TOTAL: 5 g/dL — ABNORMAL LOW (ref 6.5–8.3)
PROTEIN TOTAL: 4.8 g/dL — ABNORMAL LOW (ref 6.5–8.3)
SODIUM: 132 mmol/L — ABNORMAL LOW (ref 135–145)
SODIUM: 134 mmol/L — ABNORMAL LOW (ref 135–145)

## 2017-05-25 LAB — HEMATOCRIT
Lab: 22.5 — ABNORMAL LOW
Lab: 25 — ABNORMAL LOW

## 2017-05-25 LAB — CBC W/ AUTO DIFF
BASOPHILS ABSOLUTE COUNT: 0 10*9/L (ref 0.0–0.1)
BASOPHILS RELATIVE PERCENT: 0.3 %
EOSINOPHILS ABSOLUTE COUNT: 0.1 10*9/L (ref 0.0–0.4)
EOSINOPHILS RELATIVE PERCENT: 1.3 %
HEMATOCRIT: 24.3 % — ABNORMAL LOW (ref 41.0–53.0)
HEMOGLOBIN: 7.7 g/dL — ABNORMAL LOW (ref 13.5–17.5)
LYMPHOCYTES ABSOLUTE COUNT: 0.8 10*9/L — ABNORMAL LOW (ref 1.5–5.0)
LYMPHOCYTES RELATIVE PERCENT: 17.7 %
MEAN CORPUSCULAR HEMOGLOBIN: 28.6 pg (ref 26.0–34.0)
MEAN CORPUSCULAR VOLUME: 90.3 fL (ref 80.0–100.0)
MEAN PLATELET VOLUME: 8.4 fL (ref 7.0–10.0)
MONOCYTES ABSOLUTE COUNT: 0.3 10*9/L (ref 0.2–0.8)
MONOCYTES RELATIVE PERCENT: 6.3 %
NEUTROPHILS ABSOLUTE COUNT: 3.2 10*9/L (ref 2.0–7.5)
NEUTROPHILS RELATIVE PERCENT: 73 %
PLATELET COUNT: 131 10*9/L — ABNORMAL LOW (ref 150–440)
RED CELL DISTRIBUTION WIDTH: 14.7 % (ref 12.0–15.0)
WBC ADJUSTED: 4.4 10*9/L — ABNORMAL LOW (ref 4.5–11.0)

## 2017-05-25 LAB — LIPASE
Triacylglycerol lipase:CCnc:Pt:Ser/Plas:Qn:: 283 — ABNORMAL HIGH
Triacylglycerol lipase:CCnc:Pt:Ser/Plas:Qn:: 427 — ABNORMAL HIGH

## 2017-05-25 LAB — HEMOGLOBIN AND HEMATOCRIT, BLOOD
HEMATOCRIT: 25.2 % — ABNORMAL LOW (ref 41.0–53.0)
HEMOGLOBIN: 7.1 g/dL — ABNORMAL LOW (ref 13.5–17.5)

## 2017-05-25 LAB — MAGNESIUM
Magnesium:MCnc:Pt:Ser/Plas:Qn:: 1.4 — ABNORMAL LOW
Magnesium:MCnc:Pt:Ser/Plas:Qn:: 2.9 — ABNORMAL HIGH

## 2017-05-25 LAB — CYCLOSPORINE (FPIA) BLOOD: Lab: 47

## 2017-05-25 LAB — HEMOGLOBIN A1C: Hemoglobin A1c/Hemoglobin.total:MFr:Pt:Bld:Qn:: 6.5 — ABNORMAL HIGH

## 2017-05-25 LAB — HEMOGLOBIN: Lab: 8.1 — ABNORMAL LOW

## 2017-05-25 LAB — CO2: Carbon dioxide:SCnc:Pt:Ser/Plas:Qn:: 25

## 2017-05-25 LAB — PHOSPHORUS: Phosphate:MCnc:Pt:Ser/Plas:Qn:: 3.5

## 2017-05-25 LAB — SMEAR REVIEW

## 2017-05-25 LAB — RED CELL DISTRIBUTION WIDTH: Lab: 14.7

## 2017-05-25 MED FILL — TRAMADOL HCL/50MG/TABS: TRAMADOL HCL/50MG/TABS | 7 days supply | Qty: 30 | Fill #0

## 2017-05-25 NOTE — Unmapped (Signed)
Pt bed low and locked, side rails up x2, pt belongings and call bell within reach. Pt resp even and unlabored. No acute signs of distress or discomfort noted at this time. Will continue to assess.

## 2017-05-25 NOTE — Unmapped (Signed)
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  Hemothorax: Presenting with acute pre-syncopal symptoms/RUQ pain and new right sided hemothorax on CT chest after liver lesion biopsy in VIR. Concern that likely secondary to procedure given biopsied lesion new liver dome. Now s/p chest tube with bloody output and repeat CTA without evidence of arterial extravasation. Pleural fluid studies with elevated RBC's, hematocrit 8. Hematocrit not above 50% so not technically hemothorax although will treat as such. Chest tube initially on suction although with increased pain IP ok with putting on water seal   -Continue Chest tube with management per IP   -Daily Chest Xray   -Continue Chest tube on water seal overnight given ongoing pain   -Trend hemoglobin  - PAin control : PRN Morphine and oxycodone  -T/S    S/p OLT 09/2016: Secondary to chronic Hepatitis C and HCC. Follows with Dr. Josetta Huddle as well as [facility] transplant hepatology and oncology.  Recently found to have new liver lesions on MRI 05/04/2017 thought to be HCC versus PTLD versus fluid filled abscess now s/sp liver biopsy as above.   -FYI Transplant hepatology in the morning   -Follow up IR guided biopsy results   -Continue home Cyclosporine 175 mg BID   -Home Cellcept 500 mg BID   -Creatinine at patient baseline on presentation     Recent Choledocholithiasis: ERCP on 04/26/17 and was found to have a CBD stricture and one CBD stone. Following this procedure, his symptoms were consistent with post-ERCP pancreatitis. Hepatic function appears stable   -Was tentatively scheduled for repeat ERCP with biopsies on 3/18 although will likely need to defer     S/p tracheostomy, incompletely healed: Patient with trach tube removed 11/2016 after hypoxic respiratory failure and failed extubation attempts in setting of liver transplant.  - Will likely need to defer upcoming ENT procedures  ??  Diabetes mellitus: Complaining of recent hypoglycemia   -Recheck hemoglobin A1c   -Decrease  lantus 12->6 U nightly  -Holding lispro 6 units TID  -SSI    Paroxysmal atrial fibrillation: In NSR on presentation,  - Continue digoxin  - Hold warfarin given bleeding , will need to discuss with pulmonology good time to restart  - Daily PT/INR for monitoring     HFpEF s/p implanted pacemaker:   - Continue to monitor  ??  Pulmonary hypertension:   -Continue sildenafil   ??  Malnutrition s/p G-tube: Not currently using   - nutrition consult to assess current status   ??  GERD:  - Home omeprazole    Diet: Regular   DVT PPx: Not indicated  Electrolytes: PRN  Code Status: Full   Dispo: Floor     Code Status:  Full Code  ___________________________________________________________________    Chief Complaint  Chief Complaint   Patient presents with   ??? 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 The patient had last taken Coumadin on 05/19/17 with INR 1.14  Approximately 4 hours after the procedure the patient developed mild hypotension when standing and developed right upper quadrant abdominal pain.  CT chest and abdomen without contrast was performed and showed new moderate right sided effusion with concern for hemothorax.  Patient was immediately taken to the emergency room and was evaluated by interventional pulmonology/CT surgery who placed chest tube at bedside with suction.  CTA of chest/abdomen obtained showing interval improvement of right-sided hemothorax as well as no evidence of arterial extravasation and trace apical pneumothorax.    Upon arrival patient initially noted to have hypotension 80/60s although quickly improved and now stable at 110/60s.  He has tachypnea 20 although otherwise hemodynamically stable and not requiring oxygen.  Initial labs with hemoglobin 8.9, potassium 5.5, creatinine 1.16, magnesium 1.4.  Thoracentesis fluid showing large amount of RBCs (910, 000, hematocrit 8)    Patient states that after chest tube placement he has had pain around the site although overall feels improved since being here.  Prior to undergoing biopsy the patient states that overall he has been declining in the past few months and deconditioned.  He has been having some recent chills at night although this is not a new symptom for him.  Also reporting some weight loss. Also reports recent morning hypoglycemia to the 70's     Allergies:  Patient has no known allergies.    Medications:   Prior to Admission medications    Medication Sig Start Date End Date Taking? Authorizing Provider   sildenafil, antihypertensive, (REVATIO) 20 mg tablet Take 40 mg by mouth Three (3) times a day.   Yes Historical Provider, MD   acetaminophen (TYLENOL) 325 MG tablet Take 1 tablet (325 mg total) by mouth every four (4) hours as needed for pain. 02/06/17 02/06/18  Ruth-Ann Maryln Manuel, CPP   blood sugar diagnostic (FREESTYLE TEST) Strp loaded lipase by Other route Four (4) times a day (before meals and nightly). 12/09/16 12/09/17  Drake Leach, PA   blood-glucose meter (GLUCOSE MONITORING KIT) kit Use as instructed 12/09/16   Drake Leach, PA   capsaicin (ZOSTRIX) 0.025 % cream Apply topically Two (2) times a day. 01/20/17 01/20/18  Jimmie Molly, MD   cholecalciferol, vitamin D3, 2,000 unit cap 1 capsule (2,000 Units total) by PEG Tube route daily.  Patient taking differently: Take 2,000 Units by mouth daily.  12/09/16 12/09/17  Drake Leach, PA   digoxin (LANOXIN) 125 mcg tablet 0.5 tablets (62.5 mcg total) by G-tube route daily. 12/09/16 12/09/17  Belva Chimes Vonderau, PA   insulin ASPART (NOVOLOG FLEXPEN U-100 INSULIN) 100 unit/mL injection pen Inject 0.06 mL (6 Units total) under the skin Three (3) times a day before meals. 02/06/17 02/06/18  Ruth-Ann Maryln Manuel, CPP   insulin ASPART (NOVOLOG FLEXPEN) 100 unit/mL injection pen Additional mealtime per SSI-  151-200= 2 units,  201-250= 4 units, 251-300=add 6 units, 301-350=8 units, 351-400=10 units, >400 =12 units 03/01/17   Ricard Dillon, MD   insulin glargine (LANTUS) 100 unit/mL (3 mL) injection pen Inject 0.12 mL (12 Units total) under the skin nightly. 12/09/16 12/09/17  Drake Leach, PA   lancets Misc 1 each by Miscellaneous route Four (4)  times a day (before meals and nightly). 12/09/16 12/09/17  Belva Chimes Vonderau, PA   MAGNESIUM, AMINO ACID CHELATE, 133 mg tablet TAKE 1 TABLET BY MOUTH TWICE DAILY 03/02/17   Chirag Lanney Gins, MD   melatonin 3 mg Tab Take 1 tablet (3 mg total) by mouth nightly. 02/06/17   Ruth-Ann Maryln Manuel, CPP   mirtazapine (REMERON) 15 MG tablet Take 1 tablet (15 mg total) by mouth nightly. 05/19/17 06/18/17  Doyce Loose, MD   mycophenolate (CELLCEPT) 250 mg capsule Take 2 capsules (500 mg total) by mouth Two (2) times a day. 03/16/17 03/16/18  Chirag Lanney Gins, MD   NEORAL 100 mg capsule 175 mg BID (three 25 mg capsules and one 100 mg capsule two times a day) 05/19/17   Doyce Loose, MD   NEORAL 25 mg capsule 175 mg BID (three 25 mg capsules and one 100 mg capsule two times a day) 05/19/17   Doyce Loose, MD   omeprazole (PRILOSEC) 20 MG capsule Take 1 capsule (20 mg total) by mouth daily. 12/26/16 12/26/17  Ruth-Ann Maryln Manuel, CPP   pen needle, diabetic (ULTICARE PEN NEEDLE) 32 gauge x 5/32 Ndle 1 each by Miscellaneous route Four (4) times a day (before meals and nightly). 12/09/16   Drake Leach, PA   traMADol (ULTRAM) 50 mg tablet Take 1 tablet (50 mg total) by mouth every six (6) hours as needed for pain. 05/24/17   Drake Leach, PA   warfarin (COUMADIN) 3 MG tablet Take 3 mg by mouth nightly.  02/09/17   Historical Provider, MD       Medical History:  Past Medical History:   Diagnosis Date   ??? Cancer (CMS-HCC)    ??? Cirrhosis (CMS-HCC)    ??? Diabetes (CMS-HCC)    ??? Hepatitis C 07/17/2012   ??? Liver disease    ??? Low back pain 07/17/2012   ??? Varices, esophageal (CMS-HCC)        Surgical History:  Past Surgical History:   Procedure Laterality Date   ??? ANKLE SURGERY     ??? BACK SURGERY     ??? BACK SURGERY     ??? CHG US GUIDE, TISSUE ABLATION N/A 01/22/2016    Procedure: ULTRASOUND GUIDANCE FOR, AND MONITORING OF, PARENCHYMAL TISSUE ABLATION;  Surgeon: Particia Nearing, MD;  Location: MAIN OR [facility];  Service: Transplant   ??? IR EMBOLIZATION ORGAN ISCHEMIA, TUMORS, INFAR  06/16/2016    IR EMBOLIZATION ORGAN ISCHEMIA, TUMORS, INFAR 06/16/2016 Ammie Dalton, MD IMG VIR H&V [facility]   ??? PR COLON CA SCRN NOT HI RSK IND N/A 02/27/2015    Procedure: COLOREC CNCR SCR;COLNSCPY NO;  Surgeon: Vonda Antigua, MD;  Location: GI PROCEDURES [facility];  Service: Gastroenterology   ??? PR ENDOSCOPIC ULTRASOUND EXAM N/A 02/27/2015    Procedure: UGI ENDO; W/ENDO ULTRASOUND EXAM INCLUDES ESOPHAGUS, STOMACH, &/OR DUODENUM/JEJUNUM;  Surgeon: Vonda Antigua, MD;  Location: GI PROCEDURES [facility];  Service: Gastroenterology   ??? PR ERCP BALLOON DILATE BILIARY/PANC DUCT/AMPULLA EA N/A 02/10/2017    Procedure: ERCP;WITH TRANS-ENDOSCOPIC BALLOON DILATION OF BILIARY/PANCREATIC DUCT(S) OR OF AMPULLA, INCLUDING SPHINCTERECTOMY, WHEN PERFOREMD,EACH DUCT (14782);  Surgeon: Mayford Knife, MD;  Location: GI PROCEDURES [facility]OR[facility];  Service: Gastroenterology   ??? PR ERCP REMOVE FOREIGN BODY/STENT BILIARY/PANC DUCT N/A 02/10/2017    Procedure: ENDOSCOPIC RETROGRADE CHOLANGIOPANCREATOGRAPHY (ERCP); W/ REMOVAL OF FOREIGN BODY/STENT FROM BILIARY/PANCREATIC DUCT(S);  Surgeon: Mayford Knife, MD;  Location: GI PROCEDURES [facility];  Service:  Gastroenterology   ??? PR ERCP STENT PLACEMENT BILIARY/PANCREATIC DUCT N/A 11/29/2016    Procedure: ENDOSCOPIC RETROGRADE CHOLANGIOPANCREATOGRAPHY (ERCP); WITH PLACEMENT OF ENDOSCOPIC STENT INTO BILIARY OR PANCREATIC DUCT;  Surgeon: Chriss Driver, MD;  Location: GI PROCEDURES [facility];  Service: Gastroenterology   ??? PR ERCP STENT PLACEMENT BILIARY/PANCREATIC DUCT N/A 04/26/2017    Procedure: ENDOSCOPIC RETROGRADE CHOLANGIOPANCREATOGRAPHY (ERCP); WITH PLACEMENT OF ENDOSCOPIC STENT INTO BILIARY OR PANCREATIC DUCT;  Surgeon: Mayford Knife, MD;  Location: GI PROCEDURES [facility];  Service: Gastroenterology   ??? PR ERCP,W/REMOVAL STONE,BIL/PANCR DUCTS N/A 11/29/2016    Procedure: ERCP; W/ENDOSCOPIC RETROGRADE REMOVAL OF CALCULUS/CALCULI FROM BILIARY &/OR PANCREATIC DUCTS;  Surgeon: Chriss Driver, MD;  Location: GI PROCEDURES [facility];  Service: Gastroenterology   ??? PR ERCP,W/REMOVAL STONE,BIL/PANCR DUCTS N/A 02/10/2017    Procedure: ERCP; W/ENDOSCOPIC RETROGRADE REMOVAL OF CALCULUS/CALCULI FROM BILIARY &/OR PANCREATIC DUCTS;  Surgeon: Mayford Knife, MD;  Location: GI PROCEDURES [facility];  Service: Gastroenterology   ??? PR ERCP,W/REMOVAL STONE,BIL/PANCR DUCTS N/A 04/26/2017    Procedure: ERCP; W/ENDOSCOPIC RETROGRADE REMOVAL OF CALCULUS/CALCULI FROM BILIARY &/OR PANCREATIC DUCTS;  Surgeon: Mayford Knife, MD;  Location: GI PROCEDURES [facility]OR[facility];  Service: Gastroenterology   ??? PR INSER HEART TEMP PACER ONE CHMBR N/A 10/02/2016    Procedure: Tempoarary Pacemaker Insertion;  Surgeon: Meredith Leeds, MD;  Location: [facility];  Service: Cardiology   ??? PR LAP,DIAGNOSTIC ABDOMEN N/A 01/22/2016    Procedure: Laparoscopy, Abdomen, Peritoneum, & Omentum, Diagnostic, W/Wo Collection Specimen(S) By Brushing Or Washing;  Surgeon: Particia Nearing, MD;  Location: MAIN OR [facility];  Service: Transplant   ??? PR PLACE PERCUT GASTROSTOMY TUBE N/A 11/17/2016    Procedure: UGI ENDO; W/DIRECTED PLCMT PERQ GASTROSTOMY TUBE;  Surgeon: Cletis Athens, MD;  Location: GI PROCEDURES [facility];  Service: Gastroenterology   ??? PR TRACHEOSTOMY, PLANNED N/A 09/29/2016    Procedure: TRACHEOSTOMY PLANNED (SEPART PROC);  Surgeon: Katherina Mires, MD;  Location: MAIN OR [facility];  Service: Trauma   ??? PR TRANSCATH INSERT OR REPLACE LEADLESS PM VENTR N/A 10/11/2016    Procedure: Pacemaker Implant/Replace Leadless;  Surgeon: Meredith Leeds, MD;  Location: [facility];  Service: Cardiology   ??? PR TRANSPLANT LIVER,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    Procedure: Liver Allotransplantation; Orthotopic, Partial Or Whole, From Cadaver Or Living Donor, Any Age;  Surgeon: Doyce Loose, MD;  Location: MAIN OR [facility];  Service: Transplant   ??? PR TRANSPLANT,PREP DONOR LIVER, WHOLE N/A 09/15/2016    Procedure: Rogelia Boga Std Prep Cad Donor Whole Liver Gft Prior Tnsplnt,Inc Chole,Diss/Rem Surr Tissu Wo Triseg/Lobe Splt;  Surgeon: Doyce Loose, MD;  Location: MAIN OR [facility];  Service: Transplant   ??? PR UPPER GI ENDOSCOPY,BIOPSY N/A 07/17/2012    Procedure: UGI ENDOSCOPY; WITH BIOPSY, SINGLE OR MULTIPLE;  Surgeon: Alba Destine, MD;  Location: GI PROCEDURES [facility];  Service: Gastroenterology   ??? PR UPPER GI ENDOSCOPY,DIAGNOSIS N/A 02/04/2014    Procedure: UGI ENDO, INCLUDE ESOPHAGUS, STOMACH, & DUODENUM &/OR JEJUNUM; DX W/WO COLLECTION SPECIMN, BY BRUSH OR WASH;  Surgeon: Wilburt Finlay, MD;  Location: GI PROCEDURES [facility];  Service: Gastroenterology   ??? PR UPPER GI ENDOSCOPY,LIGAT VARIX N/A 11/05/2013    Procedure: UGI ENDO; W/BAND LIG ESOPH &/OR GASTRIC VARICES;  Surgeon: Wilburt Finlay, MD;  Location: GI PROCEDURES [facility];  Service: Gastroenterology       Social History:  Tobacco use:   reports that he has never smoked. He has never used smokeless  tobacco.  Alcohol use:   reports that he does not drink alcohol.  Drug use:  reports that he does not use drugs.  Living situation: the patient lives with their family.    Family History:  Family History   Problem Relation Age of Onset   ??? Hypertension Mother    ??? Cirrhosis Neg Hx    ??? Liver cancer Neg Hx    ??? Anemia Neg Hx    ??? Cancer Neg Hx    ??? Diabetes Neg Hx    ??? Kidney disease Neg Hx    ??? Obesity Neg Hx    ??? Thyroid disease Neg Hx    ??? Osteoporosis Neg Hx    ??? Coronary artery disease Neg Hx    ??? Anesthesia problems Neg Hx        Review of Systems:  10 systems reviewed and are negative unless otherwise mentioned in HPI    Physical Exam:  Temp:  [36.3 ??C-37.1 ??C] 37.1 ??C  Heart Rate:  [58-97] 83  SpO2 Pulse:  [71-102] 88  Resp:  [7-24] 20  BP: (83-162)/(47-94) 129/63  MAP (mmHg):  [69-98] 85  SpO2:  [89 %-100 %] 96 %  BMI (Calculated):  [20.1-20.3] 20.1    GEN: Chronically Ward appearing elderly male, in bed, pleasant, no acute distress  HEENT: EOMI, no cervical LAD  CARDIO: RRR, no m/r/g  PULM: Chest tube on right side c/d/i with bloody output, no air leak with cough. Decreased breath sounds on right side.   ABD: Soft, non-tender, non-distended, normoactive bowel sounds. G-tube in place  Extrem: NO cyanosis, no clubbing, no edema  PSYCH: alert and oreinted to person, place, and time  NEURO: no gross abnormalities  MSK:  FROM in all four extremities     Test Results:  Labs:    Lab Results   Component Value Date    WBC 5.1 05/24/2017    HGB 8.1 (L) 05/25/2017    HCT 25.2 (L) 05/25/2017    PLT 133 (L) 05/24/2017       Lab Results   Component Value Date    NA 132 (L) 05/25/2017    K 5.5 (H) 05/25/2017    CL 104 05/25/2017 CO2 25.0 05/25/2017    BUN 33 (H) 05/25/2017    CREATININE 1.16 05/25/2017    CALCIUM 8.8 05/25/2017       Lab Results   Component Value Date    MG 1.4 (L) 05/25/2017    PHOS 3.5 05/25/2017       Lab Results   Component Value Date    ALKPHOS 83 05/25/2017    BILITOT 0.8 05/25/2017    PROT 5.0 (L) 05/25/2017    ALBUMIN 2.7 (L) 05/25/2017    ALT 13 (L) 05/25/2017    AST 22 05/25/2017       Lab Results   Component Value Date    LABPROT 14.5 (H) 01/09/2014    INR 1.14 05/24/2017    APTT 35.2 02/28/2017       Imaging:   Xr Chest Portable    Result Date: 05/24/2017  EXAM: XR CHEST PORTABLE DATE: 05/24/2017 9:04 PM ACCESSION: [id number] UN DICTATED: 05/24/2017 9:03 PM INTERPRETATION LOCATION: [facility] CLINICAL INDICATION: 67 years old Male with OTHER- chest tube-  COMPARISON: 05/24/2017 at 1:37 PM TECHNIQUE: Portable Chest Radiograph. CONCLUSION: Placement of a pigtail chest tube at the right lung base. No pneumothorax or pleural effusion. Unchanged same-day appearance of the lungs with bibasilar opacities, right greater than left.  Xr Chest 1 View    Result Date: 05/24/2017  EXAM: XR CHEST 1 VIEW DATE: 05/24/2017 1:44 PM ACCESSION: [id number] UN DICTATED: 05/24/2017 2:05 PM INTERPRETATION LOCATION: [facility] CLINICAL INDICATION: 67 years old Male with PNEUMOTHORAX-  COMPARISON: Chest radiograph from 02/28/2017 and prior. TECHNIQUE: Portable AP Chest Radiograph. FINDINGS: Unchanged positioning of the leadless  pacing device. Mildly hypoinflated lungs. Subtle right greater than left lower and medial opacities, likely related to atelectasis. No pleural effusion or pneumothorax Stable cardiomediastinal silhouette. Partially imaged thoracolumbar hardware. Focal lytic lesions in right proximal humerus      Bibasilar opacities, more prominent on the right, likely related to atelectasis and hypoinflation although developing infection cannot excluded. No pneumothorax identified. Focal lytic lesions in right proximal humerus. Recommend dedicated shoulder radiographs for further assessment.     Ct Chest Abdomen Wo Contrast    Result Date: 05/24/2017  EXAM: CT Chest & Abdomen without contrast DATE: 05/24/2017 5:10 PM ACCESSION: [id number] UN DICTATED: 05/24/2017 5:50 PM INTERPRETATION LOCATION: [facility] CLINICAL INDICATION: 67 years old Male with OTHER- bleeding s/p liver biopsy-  COMPARISON: Chest radiograph from same day, MRI abdomen 05/04/2017, same day IR biopsy images TECHNIQUE: A spiral CT scan was obtained without IV contrast from the thoracic inlet through the iliac crests. Images were reconstructed in the axial plane.  Coronal and sagittal reformatted images were also provided for further evaluation. FINDINGS: AIRWAYS, LUNGS, PLEURA: Clear central tracheobronchial tree.  Right hemothorax with compressive lower lobe atelectasis. Is high density material adjacent to the right heart border, possibly in the lower mediastinum or loculated in the pleural space. Not believed to be pericardial. Mild left lower lobe atelectasis. No pneumothorax. MEDIASTINUM: Normal heart size.  No pericardial effusion. Coronary and aortic arch calcifications. Normal caliber thoracic aorta.  No mediastinal lymphadenopathy.  Subcentimeter right thyroid lobe nodule. ABDOMEN: HEPATOBILIARY: Hypoattenuating masses in the liver, better evaluated on MRI abdomen from 05/04/2017. Biliary stent noted. Left-sided large and small right pneumobilia. PANCREAS: Fatty replacement pancreas. SPLEEN: Unremarkable. ADRENAL GLANDS: Unremarkable. KIDNEYS/URETERS: Punctate bilateral calculi. BOWEL/PERITONEUM/RETROPERITONEUM: Gastrostomy tube in the stomach. No bowel obstruction. No pneumoperitoneum.. No ascites. VASCULATURE: Scattered aortic calcifications. Unremarkable inferior vena cava. LYMPH NODES: No adenopathy. BONES/SOFT TISSUES: Lower thoracic and lumbar hardware noted.     - Right-sided hemothorax. There is hyperdense material adjacent to the right heart border, this may represent a loculated portion of right hemothorax versus blood products in the lower mediastinum. This high density material is not believed to be in the pericardial space. - Large left pneumobilia and small right pneumobilia. - No evidence of pneumoperitoneum. The findings of this study were discussed via telephone with Dr. Lissa Hoard by Dr. Riley Lam Onuscheck at 05/24/2017 6:21 PM.     Jeffrey Ward Chest/abd (aortic Dissection)    Result Date: 05/24/2017  EXAM: CTA for Aortic Dissection DATE: 05/24/2017 10:06 PM ACCESSION: [id number] UN DICTATED: 05/24/2017 11:05 PM INTERPRETATION LOCATION: [facility] CLINICAL INDICATION: 67 years old Male with hemothorax-  COMPARISON: CT chest and abdomen earlier same day. MRI abdomen 05/04/2016. TECHNIQUE: A spiral CT scan was obtained without IV contrast from the thoracic inlet through the hemidiaphragms. Images were reconstructed in the axial plane. Next, a spiral CTA scan was obtained with IV contrast from the thoracic inlet through the aortic bifurcation. Images were reconstructed in the axial plane.  Multiplanar reformatted and MIP images are provided for further evaluation of the aorta. VASCULAR FINDINGS: Heart size is normal. Leadless pacer device along the left ventricle. Similar appearance of high density fluid along the right  mediastinal border (5:65) which was not present on recent MRI abdomen on 05/04/2017 and is favored to represent loculated blood products in the pleural space. Conventional three vessel aortic arch anatomy. Proximal great vessels are patent. Normal caliber ascending thoracic aorta. Minimal scattered atheromatous calcifications throughout the aorta. Abdominal aorta is normal in caliber. No acute intramural hematoma or aortic dissection. Celiac, SMA, and IMA are patent. Single bilateral renal arteries are patent. Right common iliac, external iliac, and internal iliac arteries are patent. Mild calcified and noncalcified disease is seen in the right common and internal iliac arteries. The common femoral and visualized right superficial femoral and profunda arteries are patent. Right left iliac, external iliac, and internal iliac arteries are patent. Mild calcified and noncalcified disease is seen in the left common and internal iliac arteries. The common femoral and visualized left superficial femoral and profunda arteries are patent. NONVASCULAR FINDINGS: CHEST: LUNGS/AIRWAYS/PLEURA: No endobronchial lesions. Bibasilar atelectasis, right greater than left. No pulmonary nodules. Interval placement of right basilar pigtail catheter with decrease in right hemothorax. Trace right apical pneumothorax. MEDIASTINUM/HILA:  Subcentimeter hypoenhancing right thyroid nodules. LYMPH NODES: No adenopathy. ABDOMEN/PELVIS: HEPATOBILIARY: Hypoattenuating masses in the liver, better evaluated on MRI abdomen from 05/04/2017. Linear enhancement in hepatic segment VIII adjacent to the dominant hypoenhancing lesion is favored to represent the biopsy tract (5:89). Unchanged position of common bile duct stent. Unchanged pneumobilia, left greater than right. PANCREAS: Fatty replaced. SPLEEN: Unremarkable. ADRENAL GLANDS: Unremarkable. KIDNEYS/URETERS: Symmetric nephrograms. No enhancing renal mass. Bilateral punctate obstructing renal calculi. No hydronephrosis. BLADDER: Unremarkable. BOWEL/PERITONEUM/RETROPERITONEUM: Gastrostomy tube in the stomach. Moderate colonic stool burden. No bowel obstruction. No acute inflammatory process. No pneumoperitoneum or ascites. LYMPH NODES: No adenopathy. REPRODUCTIVE ORGANS: Unremarkable. BONES/SOFT TISSUES: Bilateral gynecomastia. Diffuse osteopenia. Degenerative changes in the spine. Mild compression deformity of the T12 vertebral body with posterior spinal fixation from T10 to L2. Posterior spinal fixation from L5 to S1 with grade 1 anterolisthesis of L5 on S1.     No evidence of active arterial extravasation. Interval placement of right basilar pigtail catheter with decrease in right hemothorax. Trace right apical pneumothorax. Unchanged loculated component of the hemothorax tracking along the right mediastinal border. Multiple hypoattenuating lesions throughout the liver with sequelae of recent biopsy of the right hepatic dome lesion. Unchanged pneumobilia, left greater than right, with common bile duct stent in place.        EKG: normal EKG, normal sinus rhythm, unchanged from previous tracings.    Jeffrey Ward PGY 3  05/25/2017 3:21 AM    This note was written with assistance of voice recognition software. Please excuse any errors that were not identified during proof reading

## 2017-05-25 NOTE — Unmapped (Signed)
REPORT CALLED to [facility] on 3 west  Transport here to transfer patient to 3 west room 3213    VSS at this time  Labs sent as ordered

## 2017-05-25 NOTE — Unmapped (Signed)
Patient transported to CT Scan  Transported by Nurse  How tranported Stretcher  Cardiac Monitor yes

## 2017-05-25 NOTE — Unmapped (Signed)
MD at bedside. 

## 2017-05-25 NOTE — Unmapped (Signed)
MD at bedside. Surgeons reviewing chest tube procedure and pt signed and agreed.

## 2017-05-25 NOTE — Unmapped (Signed)
Patient rounding complete, call bell in reach, bed locked and in lowest position, patient belongings at bedside and within reach of patient.  Patient updated on plan of care.

## 2017-05-25 NOTE — Unmapped (Addendum)
Pt had a change in HR rhythm, frequent PVCs noted.  Md notified. EKG ordered, pt CMP, Mg, Phosphorus lever re-drawn.  Pt remains alert and oriented, and denies chest pain. No SOB noted.

## 2017-05-25 NOTE — Unmapped (Signed)
Patient rounds completed. The following patient needs were addressed:  Pain, Toileting, Personal Belongings, Plan of Care, Call Bell in Reach and Bed Position Low .

## 2017-05-25 NOTE — Unmapped (Signed)
Pt requesting pain meds at this time. Will provided PRN medication

## 2017-05-25 NOTE — Unmapped (Addendum)
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    Hospital course listed by problem below:  ??  Hemothorax:  Presenting with acute pre-syncopal symptoms/RUQ pain and new right sided hemothorax on CT chest likely due to liver biopsy by VIR near hepatic dome. Chest tube placed and put to suction, with bloody output. No signs of infection on fluid analysis. Abdominal and liver U/S without signs of liver bleed. Daily chest imaging showed improvement in hemothorax. Hb 10.2 prior to VIR procedure, and dropped to 7.7 immediately after discovery of hemothorax. Patient received 1U PRBC after which his Hb stabilized around 8-9. Chest tube was removed on 3/15. At the time of discharge, pain was well-controlled on PO oxycodone. Discharged with a 5 day supply.    Kidney injury, likely chronic: Unclear baseline (ranging from 1.12 early in this hospitalization and as high as 1.90 since liver transplant). Cr 1.41 just prior to VIR procedure, downtrended to 1.12 on hospital day #1, now uptrending to 1.68 on 3/16. Stable around 1.50 at time of discharge.    S/p OLT (09/2016):   Secondary to chronic Hepatitis C and HCC. Follows with Dr. Josetta Ward as well as [facility] transplant hepatology and oncology.  Recently found to have new liver lesions on MRI 05/04/2017 thought to be HCC versus PTLD versus fluid filled abscess now s/p liver biopsy as above. Patient was followed by transplant hepatology throughout hospitalization. He continued PTA immunosuppressive therapies (Cyclosporine and Cellcept). Creatinine remained at patient's baseline. VIR liver biopsy results pending.  ??  Recent Choledocholithiasis:  ERCP on 04/26/17 revealed CBD stricture and one CBD stone. Developed post-ERCP pancreatitis. Hepatic function remained stable and lipase only showed insignificatn mild elevation. Repeat ERCP originally scheduled for 3/18 with Dr. Josetta Ward.  ??  S/p tracheostomy, incompletely healed:   Patient with trach tube removed 11/2016 after hypoxic respiratory failure and failed extubation attempts in setting of liver transplant. Scheduled for ENT procedure on 3/20.  ??  Insulin Dependent Diabetes mellitus:   Complaining of recent hypoglycemia. Hemoglobin A1c 6.5% (increased from 5.1% on 12/2016). Decreased lantus from 12 to 6 U nightly. Held home lispro (6U TID) and pt placed on SSI.  ??  Paroxysmal atrial fibrillation:   Remained in NSR throughout hospitalization. Continue home digoxin. Warfarin held in the setting of hemothorax. Monitored daily PT/INRs. Restarted Warfarin on 3/16 at home dose (3 mg QHS).  ??  HFpEF s/p implanted pacemaker:   Monitored.  ??  Pulmonary hypertension:   Continued sildenafil   ??  Malnutrition s/p G-tube:   Has not used for several months. Consulted nutrition to assess current status.  ??  GERD:  Continued home omeprazole.

## 2017-05-25 NOTE — Unmapped (Signed)
Received sign out from previous resident.     ED I-PASS Handoff  ?? Illness Severit: watcher  ?? Patient Summary: Jeffrey Ward is a 67 y.o. male with a hx Hep C cirrhosis and HCC s/p liver transplant 09/2016, biopsy today of new liver lesions, atrial fibrillation, DM, neuropathy, pulmonary HTN, HFpEF s/p pacemaker, BPH, chronic pain, and anemia who presents from VIR after a biopsy today with a presyncopal event and right hemothorax.  Labs from VIR already in process. He is hemodynamically stable, BPs within normal limits.  He reports he feels better than during the presyncopal episode.  He does complain of right abd/flank pain and is tender to palpation there.   ?? Action List:   ?? F/u CXR  ?? F/u CTA  ?? Admit to medicine  ?? Situation Awareness (Contingency Planning): Anticipate admission.  ?? Synthesis by Receiver    Assessment & Plan:    ED Course as of May 25 348   Wed May 24, 2017   2303 CTA completed, imaging up.     Repaged surgery.     Thu May 25, 2017   0031 CTA:  -No evidence of active arterial extravasation.  -Interval placement of right basilar pigtail catheter with decrease in right hemothorax. Trace right apical pneumothorax.  -Unchanged loculated component of the hemothorax tracking along the right mediastinal border.  -Multiple hypoattenuating lesions throughout the liver with sequelae of recent biopsy of the right hepatic dome lesion.  -Unchanged pneumobilia, left greater than right, with common bile duct stent in place.    0115 Discussed with med W resident who will admit the patient      Patient was transferred over to D bay in the emergency department as there were no beds overnight.  He will be admitted upstairs in the morning when beds become available.

## 2017-05-25 NOTE — Unmapped (Signed)
Bed: 04-A  Expected date:   Expected time:   Means of arrival:   Comments:  VIR PATIENT

## 2017-05-25 NOTE — Unmapped (Signed)
MD at bedside for eval. Josh, RN at bedside placing PIV.

## 2017-05-25 NOTE — Unmapped (Signed)
Patient had liver mass biopsy done today. About to leave when he had a near syncopal episode. SBP 80s. CT scan done - patient found to have moderate to large R sided hemothorax.   Liver transplant patient. Coumadin stopped 3/8 for procedure today  Patient c/o lightheadedness and R sided CP.  Patient pale. Alert/oriented.

## 2017-05-25 NOTE — Unmapped (Signed)
2hr Re-ssessNo changes from previous assessment. Pt appears to be resting in bed at this time, no apparent distress. Respiration regular, equal, unlabored.  Bed low and in locked position, with side rails up.  Call bell within reach, belongings and pt wife at bedside. Pt chest tube suction discontinued.  Chest tube to water seal at this time.

## 2017-05-25 NOTE — Unmapped (Signed)
Pt care handoff given to [facility], RN

## 2017-05-25 NOTE — Unmapped (Signed)
[facility]  Emergency Department Provider Note      ED Clinical Impression     Final diagnoses:   Hemothorax on right (Primary)       Initial Impression, ED Course, Assessment and Plan     Impression: 67 yo M with a hx Hep C cirrhosis and HCC s/p liver transplant 09/2016, biopsy today of new liver lesions, atrial fibrillation, DM, neuropathy, pulmonary HTN, HFpEF s/p pacemaker, BPH, chronic pain, and anemia who presents from VIR after a biopsy today with a presyncopal event and right hemothorax.  Labs from VIR already in process. He is hemodynamically stable, BPs within normal limits.  He reports he feels better than during the presyncopal episode.  He does complain of right abd/flank pain and is tender to palpation there.      ED Course:  6:00 PM: Hgb 8.9. From 10.2 in AM.  6:30 PM: CT surgery evaluating patient. Resident to talk to attending re: plan for chest tube.  7:20 PM: CT surgery and IP to place chest tube. Plan for CTA after.  8:15 PM: Chest tube to suction. Patient resting comfortably. Will collect another CBC.   8:50 PM: Patient with pain at chest tube site. Will put to water seal. Will obtain portable CXR to ensure correct placement of tube.  9:00 PM: Care signed out to oncoming resident, Dr. Dannielle Huh.      Medical Decision Making:  This is a 67 yo M s/p liver transplant 09/2016, history of Hep C cirrhosis and HCC, biopsy today of new liver lesions, atrial fibrillation, DM, neuropathy, pulmonary HTN, HFpEF s/p pacemaker, BPH, chronic pain, and anemia who presented from VIR after presyncopal episode and new finding of right hemothorax. Presyncopal symptoms improved after IVF with VIR. Likely vasovagal syncope after standing up with contribution of blood loss due to hemothorax.  He has been hemodynamically stable here in the ED.  Interventional pulmonology and CT surgery placed his chest tube, on suction. Care was signed out to oncoming resident. CXR, CTA chest/abdomen pending, and then will require admission to a medicine team.    Additional Medical Decision Making     I reviewed the patient's prior medical records.   I discussed the case with the attending, Dr. Yetta Barre.    Labs and radiology results that were available during my care of the patient were independently reviewed by me and considered in my medical decision making.    Portions of this record have been created using Scientist, clinical (histocompatibility and immunogenetics). Dictation errors have been sought, but may not have been identified and corrected.  ____________________________________________    I have reviewed the triage vital signs and the nursing notes.     History     Chief Complaint  Near Syncope      HPI   Jeffrey Ward is a 67 y.o. male VIR after a presyncopal event and a finding of a right-sided hemothorax after a liver biopsy performed today.  He reports that he was getting ready to go home after the biopsy when he stood up and felt very lightheaded, like he was going to pass out. He also complained of right-sided abdominal pain.  A CT chest obtained showed a right-sided hemothorax.  He received a liter of IV fluids there.  VIR sent him to the ER for a stat CT chest and abdomen CTA, placement of the chest tube, and admission.  He reports chronic shortness of breath, no new changes.  He denies chest pain.  He reports feeling better  after the presyncopal episode.  He does report right sided abdomen/flank pain.      Past Medical History:   Diagnosis Date   ??? Cancer (CMS-HCC)    ??? Cirrhosis (CMS-HCC)    ??? Diabetes (CMS-HCC)    ??? Hepatitis C 07/17/2012   ??? Liver disease    ??? Low back pain 07/17/2012   ??? Varices, esophageal (CMS-HCC)        Patient Active Problem List   Diagnosis   ??? Hepatitis C   ??? Low back pain   ??? Hematuria   ??? Calculus of ureter   ??? Anemia   ??? Benign prostatic hyperplasia   ??? Chronic pain disorder   ??? Dysrhythmia   ??? History of gastroesophageal reflux (GERD)   ??? History of hepatitis C virus infection   ??? History of substance abuse   ??? Hyperglycemia, unspecified   ??? Intermittent vertigo   ??? Microscopic hematuria   ??? Postlaminectomy syndrome, lumbar region   ??? Thrombocytopenia (CMS-HCC)   ??? Vitamin D deficiency   ??? Atrial fibrillation with RVR (CMS-HCC)   ??? Secondary esophageal varices (CMS-HCC)   ??? Right ventricular dilation   ??? End-stage liver disease (CMS-HCC)   ??? HCC (hepatocellular carcinoma) (CMS-HCC)   ??? Paroxysmal atrial fibrillation (CMS-HCC)   ??? Hepatic encephalopathy (CMS-HCC)   ??? S/P liver transplant (CMS-HCC)   ??? Pulmonary hypertension, moderate to severe (CMS-HCC)   ??? Acute right heart failure (CMS-HCC)   ??? Tracheostomy dependent (CMS-HCC)   ??? Mixed level of activity delirium due to multiple etiologies, resolved   ??? Insomnia   ??? Peripheral neuropathy   ??? Influenza A       Past Surgical History:   Procedure Laterality Date   ??? ANKLE SURGERY     ??? BACK SURGERY     ??? BACK SURGERY     ??? CHG US GUIDE, TISSUE ABLATION N/A 01/22/2016    Procedure: ULTRASOUND GUIDANCE FOR, AND MONITORING OF, PARENCHYMAL TISSUE ABLATION;  Surgeon: Particia Nearing, MD;  Location: MAIN OR [facility];  Service: Transplant   ??? IR EMBOLIZATION ORGAN ISCHEMIA, TUMORS, INFAR  06/16/2016    IR EMBOLIZATION ORGAN ISCHEMIA, TUMORS, INFAR 06/16/2016 Ammie Dalton, MD IMG VIR H&V [facility]   ??? PR COLON CA SCRN NOT HI RSK IND N/A 02/27/2015    Procedure: COLOREC CNCR SCR;COLNSCPY NO;  Surgeon: Vonda Antigua, MD;  Location: GI PROCEDURES [facility];  Service: Gastroenterology   ??? PR ENDOSCOPIC ULTRASOUND EXAM N/A 02/27/2015    Procedure: UGI ENDO; W/ENDO ULTRASOUND EXAM INCLUDES ESOPHAGUS, STOMACH, &/OR DUODENUM/JEJUNUM;  Surgeon: Vonda Antigua, MD;  Location: GI PROCEDURES MEMORIAL [facility];  Service: Gastroenterology   ??? PR ERCP BALLOON DILATE BILIARY/PANC DUCT/AMPULLA EA N/A 02/10/2017    Procedure: ERCP;WITH TRANS-ENDOSCOPIC BALLOON DILATION OF BILIARY/PANCREATIC DUCT(S) OR OF AMPULLA, INCLUDING SPHINCTERECTOMY, WHEN PERFOREMD,EACH DUCT (81191);  Surgeon: Mayford Knife, MD;  Location: GI PROCEDURES MEMORIAL [facility];  Service: Gastroenterology   ??? PR ERCP REMOVE FOREIGN BODY/STENT BILIARY/PANC DUCT N/A 02/10/2017    Procedure: ENDOSCOPIC RETROGRADE CHOLANGIOPANCREATOGRAPHY (ERCP); W/ REMOVAL OF FOREIGN BODY/STENT FROM BILIARY/PANCREATIC DUCT(S);  Surgeon: Mayford Knife, MD;  Location: GI PROCEDURES MEMORIAL [facility];  Service: Gastroenterology   ??? PR ERCP STENT PLACEMENT BILIARY/PANCREATIC DUCT N/A 11/29/2016    Procedure: ENDOSCOPIC RETROGRADE CHOLANGIOPANCREATOGRAPHY (ERCP); WITH PLACEMENT OF ENDOSCOPIC STENT INTO BILIARY OR PANCREATIC DUCT;  Surgeon: Chriss Driver, MD;  Location: GI PROCEDURES [facility]OR[facility];  Service: Gastroenterology   ??? PR ERCP STENT PLACEMENT BILIARY/PANCREATIC  DUCT N/A 04/26/2017    Procedure: ENDOSCOPIC RETROGRADE CHOLANGIOPANCREATOGRAPHY (ERCP); WITH PLACEMENT OF ENDOSCOPIC STENT INTO BILIARY OR PANCREATIC DUCT;  Surgeon: Mayford Knife, MD;  Location: GI PROCEDURES MEMORIAL [facility];  Service: Gastroenterology   ??? PR ERCP,W/REMOVAL STONE,BIL/PANCR DUCTS N/A 11/29/2016    Procedure: ERCP; W/ENDOSCOPIC RETROGRADE REMOVAL OF CALCULUS/CALCULI FROM BILIARY &/OR PANCREATIC DUCTS;  Surgeon: Chriss Driver, MD;  Location: GI PROCEDURES [facility];  Service: Gastroenterology   ??? PR ERCP,W/REMOVAL STONE,BIL/PANCR DUCTS N/A 02/10/2017    Procedure: ERCP; W/ENDOSCOPIC RETROGRADE REMOVAL OF CALCULUS/CALCULI FROM BILIARY &/OR PANCREATIC DUCTS;  Surgeon: Mayford Knife, MD;  Location: GI PROCEDURES MEMORIAL [facility];  Service: Gastroenterology   ??? PR ERCP,W/REMOVAL STONE,BIL/PANCR DUCTS N/A 04/26/2017    Procedure: ERCP; W/ENDOSCOPIC RETROGRADE REMOVAL OF CALCULUS/CALCULI FROM BILIARY &/OR PANCREATIC DUCTS;  Surgeon: Mayford Knife, MD;  Location: GI PROCEDURES MEM[facility];  Service: Gastroenterology   ??? PR INSER HEART TEMP PACER ONE CHMBR N/A 10/02/2016    Procedure: Tempoarary Pacemaker Insertion;  Surgeon: Meredith Leeds, MD;  Location: [facility];  Service: Cardiology   ??? PR LAP,DIAGNOSTIC ABDOMEN N/A 01/22/2016    Procedure: Laparoscopy, Abdomen, Peritoneum, & Omentum, Diagnostic, W/Wo Collection Specimen(S) By Brushing Or Washing;  Surgeon: Particia Nearing, MD;  Location: MAIN OR [facility];  Service: Transplant   ??? PR PLACE PERCUT GASTROSTOMY TUBE N/A 11/17/2016    Procedure: UGI ENDO; W/DIRECTED PLCMT PERQ GASTROSTOMY TUBE;  Surgeon: Cletis Athens, MD;  Location: GI PROCEDURES MEMOR[facility];  Service: Gastroenterology   ??? PR TRACHEOSTOMY, PLANNED N/A 09/29/2016    Procedure: TRACHEOSTOMY PLANNED (SEPART PROC);  Surgeon: Katherina Mires, MD;  Location: MAIN OR [facility];  Service: Trauma   ??? PR TRANSCATH INSERT OR REPLACE LEADLESS PM VENTR N/A 10/11/2016    Procedure: Pacemaker Implant/Replace Leadless;  Surgeon: Meredith Leeds, MD;  Location: [facility];  Service: Cardiology   ??? PR TRANSPLANT LIVER,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    Procedure: Liver Allotransplantation; Orthotopic, Partial Or Whole, From Cadaver Or Living Donor, Any Age;  Surgeon: Doyce Loose, MD;  Location: MAIN OR [facility];  Service: Transplant   ??? PR TRANSPLANT,PREP DONOR LIVER, WHOLE N/A 09/15/2016    Procedure: Rogelia Boga Std Prep Cad Donor Whole Liver Gft Prior Tnsplnt,Inc Chole,Diss/Rem Surr Tissu Wo Triseg/Lobe Splt;  Surgeon: Doyce Loose, MD;  Location: MAIN OR [facility];  Service: Transplant   ??? PR UPPER GI ENDOSCOPY,BIOPSY N/A 07/17/2012    Procedure: UGI ENDOSCOPY; WITH BIOPSY, SINGLE OR MULTIPLE;  Surgeon: Alba Destine, MD;  Location: GI PROCEDURES [facility];  Service: Gastroenterology   ??? PR UPPER GI ENDOSCOPY,DIAGNOSIS N/A 02/04/2014    Procedure: UGI ENDO, INCLUDE ESOPHAGUS, STOMACH, & DUODENUM &/OR JEJUNUM; DX W/WO COLLECTION SPECIMN, BY BRUSH OR WASH;  Surgeon: Wilburt Finlay, MD;  Location: GI PROCEDURES MEMORIAL [facility];  Service: Gastroenterology   ??? PR UPPER GI ENDOSCOPY,LIGAT VARIX N/A 11/05/2013    Procedure: UGI ENDO; W/BAND LIG ESOPH &/OR GASTRIC VARICES;  Surgeon: Wilburt Finlay, MD;  Location: GI PROCEDURES MEMORIAL [facility];  Service: Gastroenterology         Current Facility-Administered Medications:   ???  pregabalin (LYRICA) capsule 50 mg, 50 mg, Oral, Nightly, Oran Rein, MD    Current Outpatient Prescriptions:   ???  acetaminophen (TYLENOL) 325 MG tablet, Take 1 tablet (325 mg total) by mouth every four (4) hours as needed for pain., Disp: 100 tablet, Rfl: 2  ???  blood sugar diagnostic (FREESTYLE TEST) Strp, by Other route Four (  4) times a day (before meals and nightly)., Disp: 100 each, Rfl: 11  ???  blood-glucose meter (GLUCOSE MONITORING KIT) kit, Use as instructed, Disp: 1 each, Rfl: 0  ???  capsaicin (ZOSTRIX) 0.025 % cream, Apply topically Two (2) times a day., Disp: 60 g, Rfl: 0  ???  cholecalciferol, vitamin D3, 2,000 unit cap, 1 capsule (2,000 Units total) by PEG Tube route daily. (Patient taking differently: Take 2,000 Units by mouth daily. ), Disp: 30 capsule, Rfl: 11  ???  digoxin (LANOXIN) 125 mcg tablet, 0.5 tablets (62.5 mcg total) by G-tube route daily., Disp: 15 tablet, Rfl: 11  ???  insulin ASPART (NOVOLOG FLEXPEN U-100 INSULIN) 100 unit/mL injection pen, Inject 0.06 mL (6 Units total) under the skin Three (3) times a day before meals., Disp: 4 mL, Rfl: 11  ???  insulin ASPART (NOVOLOG FLEXPEN) 100 unit/mL injection pen, Additional mealtime per SSI-  151-200= 2 units,  201-250= 4 units, 251-300=add 6 units, 301-350=8 units, 351-400=10 units, >400 =12 units, Disp: 1.8 mL, Rfl: 0  ???  insulin glargine (LANTUS) 100 unit/mL (3 mL) injection pen, Inject 0.12 mL (12 Units total) under the skin nightly., Disp: 4 mL, Rfl: 11  ???  lancets Misc, 1 each by Miscellaneous route Four (4) times a day (before meals and nightly)., Disp: 100 each, Rfl: 11  ???  MAGNESIUM, AMINO ACID CHELATE, 133 mg tablet, TAKE 1 TABLET BY MOUTH TWICE DAILY, Disp: 100 tablet, Rfl: PRN  ???  melatonin 3 mg Tab, Take 1 tablet (3 mg total) by mouth nightly., Disp: 30 tablet, Rfl: 0  ???  mirtazapine (REMERON) 15 MG tablet, Take 1 tablet (15 mg total) by mouth nightly., Disp: 30 tablet, Rfl: 3  ???  mycophenolate (CELLCEPT) 250 mg capsule, Take 2 capsules (500 mg total) by mouth Two (2) times a day., Disp: 360 capsule, Rfl: 3  ???  NEORAL 100 mg capsule, 175 mg BID (three 25 mg capsules and one 100 mg capsule two times a day), Disp: 60 capsule, Rfl: 11  ???  NEORAL 25 mg capsule, 175 mg BID (three 25 mg capsules and one 100 mg capsule two times a day), Disp: 180 capsule, Rfl: 11  ???  omeprazole (PRILOSEC) 20 MG capsule, Take 1 capsule (20 mg total) by mouth daily., Disp: 30 capsule, Rfl: 1  ???  pen needle, diabetic (ULTICARE PEN NEEDLE) 32 gauge x 5/32 Ndle, 1 each by Miscellaneous route Four (4) times a day (before meals and nightly)., Disp: 90 each, Rfl: 3  ???  traMADol (ULTRAM) 50 mg tablet, Take 1 tablet (50 mg total) by mouth every six (6) hours as needed for pain., Disp: 30 tablet, Rfl: 0  ???  warfarin (COUMADIN) 3 MG tablet, Take 3 mg by mouth nightly. , Disp: , Rfl:     Allergies  Patient has no known allergies.    Family History   Problem Relation Age of Onset   ??? Hypertension Mother    ??? Cirrhosis Neg Hx    ??? Liver cancer Neg Hx    ??? Anemia Neg Hx    ??? Cancer Neg Hx    ??? Diabetes Neg Hx    ??? Kidney disease Neg Hx    ??? Obesity Neg Hx    ??? Thyroid disease Neg Hx    ??? Osteoporosis Neg Hx    ??? Coronary artery disease Neg Hx    ??? Anesthesia problems Neg Hx        Social History  Social  History   Substance Use Topics   ??? Smoking status: Never Smoker   ??? Smokeless tobacco: Never Used      Comment: Smoked in high school for about 5 years.    ??? Alcohol use No       Review of Systems  Constitutional: Negative for fever.  Eyes: Negative for visual changes.  ENT: Negative for sore throat.  Cardiovascular: Negative for chest pain, palpitations.  Respiratory: Positive for chronic shortness of breath.  Gastrointestinal: Positive for right-sided abdominal/flank pain.  Genitourinary: Negative for dysuria, hematuria.  Musculoskeletal: Negative for back pain, joint pain or swelling.  Skin: Negative for rash.  Neurological: Negative for headaches, focal weakness or numbness.    Physical Exam     ED Triage Vitals [05/24/17 1722]   Enc Vitals Group      BP 151/76      Heart Rate 66      SpO2 Pulse       Resp 12      Temp       Temp src       SpO2 100 %      Weight       Height       Head Circumference       Peak Flow       Pain Score       Pain Loc       Pain Edu?       Excl. in GC?        Constitutional: Tired-appearing male, no acute distress  Eyes: Conjunctivae are normal.  ENT       Head: Normocephalic and atraumatic.       Nose: No congestion.       Mouth/Throat: Mucous membranes are moist.  Cardiovascular: RRR, no murmurs. Cap refill nml.  Respiratory: Mildly increased work of breathing. No wheezing noted. Decreased lung sounds.  Gastrointestinal: Soft, non-distended, tender to palpation on right side of abdomen/flank.  Musculoskeletal: Normal range of motion in all extremities. Nml bulk and tone.  Neurologic: Normal speech and language. No gross focal neurologic deficits are appreciated.  Extremities: No LE edema.  Skin: Skin is warm, dry and intact. No rash noted.  Psychiatric: Mood and affect are normal. Speech and behavior are normal.      Radiology     Not yet done         Luiz Ochoa, MD  Resident  05/24/17 [phone]

## 2017-05-25 NOTE — Unmapped (Signed)
Report received from [place], [place]. Care transferred at this time. Patient rounding complete, call bell in reach, bed locked and in lowest position, patient belongings at bedside and within reach of patient.  Patient updated on plan of care.

## 2017-05-25 NOTE — Unmapped (Signed)
[facility] POST PROCEDURE NOTE      Date/Time: 05/24/2017/7:40 PM      Attending: Dr. Lissa Hoard      Assistant(s): None      Diagnosis: Liver masses      Procedure:  Ultrasound-guided liver mass biopsy      Time out: Prior to the procedure, a time out was performed with all team members present.  During the time out, the patient, procedure and procedure site when applicable were verbally verified by the team members and Dr. Lissa Hoard.      Anesthesia: Conscious Sedation     Medications Given:  2 mg Versed IV, 50 mcg Fentanyl IV     Contrast Used: None    Complications: Transpleural needle pass into the right hepatic dome lesion. Immediate post-op chest xray without evidence of pneumothorax. Post-procedurally the patient developed pain and blood pressure dropped upon standing. Non-contrast CT scan showed moderate right hemothorax with blood tracking along the medial right pleural/mediastinal space. Patient was transferred to the ED and CTA of the chest/abdomen was ordered.     Estimated Blood Loss: Minimal    Specimens:  3 18-gauge core biopsy samples obtained from the left hepatic lobe lesion and 3 18-gauge core biopsy samples obtained from the right hepatic dome lesion.      Major Findings:  Hypodense segment 2/3 and segment 8 lesions. Ultrasound guided core biopsy of the right and left hepatic lobe lesions. Air noted through the biliary tree. Transpleural needle pass noted upon guidance to the segment 8 lesion.       Plan: Patient was transferred to the ED and CTA of the chest/abdomen was ordered - awaiting scan.       Attending Signature:  Lissa Hoard MD, PhD    See detailed procedure note with images in PACS.

## 2017-05-25 NOTE — Unmapped (Signed)
Portable chest xray at bedside.

## 2017-05-25 NOTE — Unmapped (Addendum)
Jeffrey Ward developed mild hypotension when standing. He continues to complain of abdominal pain, RUQ. On exam he appeared pale and has RUQ TTP. 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 CBC and type and screen sent. Spoke with ED charge nurse regarding triage. Patient will be transported to ED for further workup. Request STAT CT chest and abdomen CTA to evaluate for ongoing hemorrhage that may be amenable to embolization. CT surgery consult is also recommended for possible admission. He will require a right chest tube which can be placed by [facility] or another service; however, further workup with CT and labs is indicated first.

## 2017-05-25 NOTE — Unmapped (Signed)
HEPATOLOGY INPATIENT CONSULTATION NOTE    Requesting Attending Physician :  MDU  Reason for Consult:    Jeffrey Ward is a 67 y.o. male seen in consultation at the request of MDU for hemothorax in a status post OLT patient.    ASSESSMENT & RECOMMENDATIONS  Hemothorax  Patient is now status post chest tube placement with tube initially to suction and now to waterseal.  CTA following chest tube placement demonstrated decrease in right hemothorax.  There is no evidence of active extravasation on the CTA so we are cautiously optimistic that the hemorrhage has ceased.  We greatly appreciate interventional pulmonology's management.  ???Defer management to interventional pulmonology and appreciate their assistance    Status post OLT 09/2016  Patient has had excellent graft function following his surgery and most recent liver enzymes from this morning are all within normal limits.  His immunosuppressive regiment is cyclosporine 175 mg twice daily and CellCept 500 mg twice daily.  We recommend continuing these with no dose adjustment at this time given normal renal function.  ??? Continue cyclosporine 175 mg twice daily  ???Continue CellCept 500 mg twice daily  ???No dose adjustments at this time.    Thank you for the consult. Patient was seen and evaluated by Dr. Piedad Climes who agrees with the assessment and plan. These recommendations were discussed with the primary team. Please page the gastroenterology consult pager [phone]) with any questions or changes in clinical condition.      Chief Complaint: Hemothorax following liver biopsy in a status post OLT patient.    History of Present Illness:   This is a 67 y.o. 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 Patient underwent MRI of the abdomen with and without contrast on 05/04/2017.  These results are not available for some reason in epic, however there were new findings of multiple liver masses.  The case was discussed at tumor board on 05/17/2017 and felt that these were unlikely to be HCC.  Also on the differential was PTLD and abscess.  For further evaluation he underwent an ultrasound-guided liver biopsy on 05/24/2017.  Approximately 4 hours after the procedure he had a presyncopal event and developed abdominal pain.  He was sent to the emergency room for further evaluation.  He is on Coumadin for management of his paroxysmal A. fib with last dose on 05/19/2017.    Upon arrival to the [facility] emergency department he underwent a CT of the chest and abdomen without contrast.  This demonstrated a right-sided hemothorax, with a loculated component tracking along the right mediastinal border as well as large left pneumobilia and small right pneumobilia.  INR was 1.14.  He was seen and evaluated in the emergency room by interventional pulmonology and CT surgery who placed a chest tube at bedside to suction.  Following this he had a CT a demonstrating interval improvement of the right-sided hemothorax.    Review of Systems:  The balance of 12 systems reviewed is negative except as noted in the HPI.     Past Medical History:  Past Medical History:   Diagnosis Date   ??? Cancer (CMS-HCC)    ??? Cirrhosis (CMS-HCC)    ??? Diabetes (CMS-HCC)    ??? Hepatitis C 07/17/2012   ??? Liver disease    ??? Low  back pain 07/17/2012   ??? Varices, esophageal (CMS-HCC)        Surgical History:  Past Surgical History:   Procedure Laterality Date   ??? ANKLE SURGERY     ??? BACK SURGERY     ??? BACK SURGERY     ??? CHG US GUIDE, TISSUE ABLATION N/A 01/22/2016    Procedure: ULTRASOUND GUIDANCE FOR, AND MONITORING OF, PARENCHYMAL TISSUE ABLATION;  Surgeon: Particia Nearing, MD;  Location: MAIN OR [facility];  Service: Transplant   ??? IR EMBOLIZATION ORGAN ISCHEMIA, TUMORS, INFAR 06/16/2016    IR EMBOLIZATION ORGAN ISCHEMIA, TUMORS, INFAR 06/16/2016 Ammie Dalton, MD IMG VIR H&V [facility]   ??? PR COLON CA SCRN NOT HI RSK IND N/A 02/27/2015    Procedure: COLOREC CNCR SCR;COLNSCPY NO;  Surgeon: Vonda Antigua, MD;  Location: GI PROCEDURES MEMORIAL [facility];  Service: Gastroenterology   ??? PR ENDOSCOPIC ULTRASOUND EXAM N/A 02/27/2015    Procedure: UGI ENDO; W/ENDO ULTRASOUND EXAM INCLUDES ESOPHAGUS, STOMACH, &/OR DUODENUM/JEJUNUM;  Surgeon: Vonda Antigua, MD;  Location: GI PROCEDURES MEMORIAL [facility];  Service: Gastroenterology   ??? PR ERCP BALLOON DILATE BILIARY/PANC DUCT/AMPULLA EA N/A 02/10/2017    Procedure: ERCP;WITH TRANS-ENDOSCOPIC BALLOON DILATION OF BILIARY/PANCREATIC DUCT(S) OR OF AMPULLA, INCLUDING SPHINCTERECTOMY, WHEN PERFOREMD,EACH DUCT (16109);  Surgeon: Mayford Knife, MD;  Location: GI PROCEDURES MEMORIAL [facility];  Service: Gastroenterology   ??? PR ERCP REMOVE FOREIGN BODY/STENT BILIARY/PANC DUCT N/A 02/10/2017    Procedure: ENDOSCOPIC RETROGRADE CHOLANGIOPANCREATOGRAPHY (ERCP); W/ REMOVAL OF FOREIGN BODY/STENT FROM BILIARY/PANCREATIC DUCT(S);  Surgeon: Mayford Knife, MD;  Location: GI PROCEDURES [facility];  Service: Gastroenterology   ??? PR ERCP STENT PLACEMENT BILIARY/PANCREATIC DUCT N/A 11/29/2016    Procedure: ENDOSCOPIC RETROGRADE CHOLANGIOPANCREATOGRAPHY (ERCP); WITH PLACEMENT OF ENDOSCOPIC STENT INTO BILIARY OR PANCREATIC DUCT;  Surgeon: Chriss Driver, MD;  Location: GI PROCEDURES [facility]ORIAL [facility];  Service: Gastroenterology   ??? PR ERCP STENT PLACEMENT BILIARY/PANCREATIC DUCT N/A 04/26/2017    Procedure: ENDOSCOPIC RETROGRADE CHOLANGIOPANCREATOGRAPHY (ERCP); WITH PLACEMENT OF ENDOSCOPIC STENT INTO BILIARY OR PANCREATIC DUCT;  Surgeon: Mayford Knife, MD;  Location: GI PROCEDURES MEMORIAL [facility];  Service: Gastroenterology   ??? PR ERCP,W/REMOVAL STONE,BIL/PANCR DUCTS N/A 11/29/2016    Procedure: ERCP; W/ENDOSCOPIC RETROGRADE REMOVAL OF CALCULUS/CALCULI FROM BILIARY &/OR PANCREATIC DUCTS;  Surgeon: Chriss Driver, MD;  Location: GI PROCEDURES MEMORIAL [facility];  Service: Gastroenterology   ??? PR ERCP,W/REMOVAL STONE,BIL/PANCR DUCTS N/A 02/10/2017    Procedure: ERCP; W/ENDOSCOPIC RETROGRADE REMOVAL OF CALCULUS/CALCULI FROM BILIARY &/OR PANCREATIC DUCTS;  Surgeon: Mayford Knife, MD;  Location: GI PROCEDURES MEMORIAL [facility];  Service: Gastroenterology   ??? PR ERCP,W/REMOVAL STONE,BIL/PANCR DUCTS N/A 04/26/2017    Procedure: ERCP; W/ENDOSCOPIC RETROGRADE REMOVAL OF CALCULUS/CALCULI FROM BILIARY &/OR PANCREATIC DUCTS;  Surgeon: Mayford Knife, MD;  Location: GI PROCEDURES MEMORIAL [facility];  Service: Gastroenterology   ??? PR INSER HEART TEMP PACER ONE CHMBR N/A 10/02/2016    Procedure: Tempoarary Pacemaker Insertion;  Surgeon: Meredith Leeds, MD;  Location: [facility];  Service: Cardiology   ??? PR LAP,DIAGNOSTIC ABDOMEN N/A 01/22/2016    Procedure: Laparoscopy, Abdomen, Peritoneum, & Omentum, Diagnostic, W/Wo Collection Specimen(S) By Brushing Or Washing;  Surgeon: Particia Nearing, MD;  Location: MAIN OR [facility];  Service: Transplant   ??? PR PLACE PERCUT GASTROSTOMY TUBE N/A 11/17/2016    Procedure: UGI ENDO; W/DIRECTED PLCMT PERQ GASTROSTOMY TUBE;  Surgeon: Cletis Athens, MD;  Location: GI PROCEDURES MEMORIAL [facility];  Service: Gastroenterology   ??? PR TRACHEOSTOMY, PLANNED N/A 09/29/2016    Procedure:  TRACHEOSTOMY PLANNED (SEPART PROC);  Surgeon: Katherina Mires, MD;  Location: MAIN OR [facility];  Service: Trauma   ??? PR TRANSCATH INSERT OR REPLACE LEADLESS PM VENTR N/A 10/11/2016    Procedure: Pacemaker Implant/Replace Leadless;  Surgeon: Meredith Leeds, MD;  Location: [facility];  Service: Cardiology   ??? PR TRANSPLANT LIVER,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    Procedure: Liver Allotransplantation; Orthotopic, Partial Or Whole, From Cadaver Or Living Donor, Any Age;  Surgeon: Doyce Loose, MD;  Location: MAIN OR [facility];  Service: Transplant   ??? PR TRANSPLANT,PREP DONOR LIVER, WHOLE N/A 09/15/2016    Procedure: Rogelia Boga Std Prep Cad Donor Whole Liver Gft Prior Tnsplnt,Inc Chole,Diss/Rem Surr Tissu Wo Triseg/Lobe Splt;  Surgeon: Doyce Loose, MD;  Location: MAIN OR [facility];  Service: Transplant   ??? PR UPPER GI ENDOSCOPY,BIOPSY N/A 07/17/2012    Procedure: UGI ENDOSCOPY; WITH BIOPSY, SINGLE OR MULTIPLE;  Surgeon: Alba Destine, MD;  Location: GI PROCEDURES MEMORIAL [facility];  Service: Gastroenterology   ??? PR UPPER GI ENDOSCOPY,DIAGNOSIS N/A 02/04/2014    Procedure: UGI ENDO, INCLUDE ESOPHAGUS, STOMACH, & DUODENUM &/OR JEJUNUM; DX W/WO COLLECTION SPECIMN, BY BRUSH OR WASH;  Surgeon: Wilburt Finlay, MD;  Location: GI PROCEDURES MEMORIAL [facility];  Service: Gastroenterology   ??? PR UPPER GI ENDOSCOPY,LIGAT VARIX N/A 11/05/2013    Procedure: UGI ENDO; W/BAND LIG ESOPH &/OR GASTRIC VARICES;  Surgeon: Wilburt Finlay, MD;  Location: GI PROCEDURES MEMORIAL [facility];  Service: Gastroenterology       Family History:  Family History   Problem Relation Age of Onset   ??? Hypertension Mother    ??? Cirrhosis Neg Hx    ??? Liver cancer Neg Hx    ??? Anemia Neg Hx    ??? Cancer Neg Hx    ??? Diabetes Neg Hx    ??? Kidney disease Neg Hx    ??? Obesity Neg Hx    ??? Thyroid disease Neg Hx    ??? Osteoporosis Neg Hx    ??? Coronary artery disease Neg Hx    ??? Anesthesia problems Neg Hx        Medications:   Current Facility-Administered Medications   Medication Dose Route Frequency Provider Last Rate Last Dose   ??? acetaminophen (TYLENOL) tablet 325 mg  325 mg Oral Q4H PRN Lottie Rater, MD       ??? cycloSPORINE modified (NEORAL) capsule 175 mg  175 mg Oral BID Lottie Rater, MD       ??? dextrose 50 % in water (D50W) solution 12.5 g  12.5 g Intravenous Q10 Min PRN Lottie Rater, MD       ??? digoxin (LANOXIN) tablet 62.5 mcg  62.5 mcg G-tube Daily Lottie Rater, MD       ??? insulin glargine (LANTUS) injection 6 Units  6 Units Subcutaneous Nightly Lottie Rater, MD   6 Units at 05/25/17 0322   ??? insulin lispro (HumaLOG) injection 0-12 Units  0-12 Units Subcutaneous ACHS Lottie Rater, MD       ??? magnesium oxide (MAG-OX) tablet 400 mg  400 mg Oral Daily Lottie Rater, MD       ??? melatonin tablet 3 mg  3 mg Oral Nightly Lottie Rater, MD   3 mg at 05/25/17 0200   ??? mirtazapine (REMERON) tablet 15 mg  15 mg Oral Nightly Lottie Rater, MD   15 mg at 05/25/17 0200   ??? MORPhine 4 mg/mL injection 2 mg  2  mg Intravenous Q4H PRN Lottie Rater, MD   2 mg at 05/25/17 0600   ??? mycophenolate (CELLCEPT) capsule 500 mg  500 mg Oral BID Lottie Rater, MD   500 mg at 05/25/17 0314   ??? ondansetron (ZOFRAN-ODT) disintegrating tablet 4 mg  4 mg Oral Q8H PRN Lottie Rater, MD       ??? oxyCODONE (ROXICODONE) immediate release tablet 5 mg  5 mg Oral Q4H PRN Lottie Rater, MD   5 mg at 05/25/17 0204   ??? pantoprazole (PROTONIX) EC tablet 20 mg  20 mg Oral Daily Lottie Rater, MD       ??? polyethylene glycol (MIRALAX) packet 17 g  17 g Oral Daily PRN Lottie Rater, MD       ??? pregabalin (LYRICA) capsule 50 mg  50 mg Oral Nightly Lottie Rater, MD   50 mg at 05/25/17 0314   ??? senna (SENOKOT) tablet 2 tablet  2 tablet Oral Nightly PRN Lottie Rater, MD       ??? sildenafil (antihypertensive) (REVATIO) tablet 40 mg  40 mg Oral TID Lottie Rater, MD       ??? traMADol Janean Sark) tablet 50 mg  50 mg Oral Q6H PRN Lottie Rater, MD         Current Outpatient Prescriptions   Medication Sig Dispense Refill   ??? sildenafil, antihypertensive, (REVATIO) 20 mg tablet Take 40 mg by mouth Three (3) times a day.     ??? acetaminophen (TYLENOL) 325 MG tablet Take 1 tablet (325 mg total) by mouth every four (4) hours as needed for pain. 100 tablet 2   ??? blood sugar diagnostic (FREESTYLE TEST) Strp by Other route Four (4) times a day (before meals and nightly). 100 each 11   ??? blood-glucose meter (GLUCOSE MONITORING KIT) kit Use as instructed 1 each 0   ??? cholecalciferol, vitamin D3, 2,000 unit cap 1 capsule (2,000 Units total) by PEG Tube route daily. (Patient taking differently: Take 2,000 Units by mouth daily. ) 30 capsule 11   ??? digoxin (LANOXIN) 125 mcg tablet 0.5 tablets (62.5 mcg total) by G-tube route daily. 15 tablet 11   ??? insulin ASPART (NOVOLOG FLEXPEN U-100 INSULIN) 100 unit/mL injection pen Inject 0.06 mL (6 Units total) under the skin Three (3) times a day before meals. 4 mL 11   ??? insulin ASPART (NOVOLOG FLEXPEN) 100 unit/mL injection pen Additional mealtime per SSI-  151-200= 2 units,  201-250= 4 units, 251-300=add 6 units, 301-350=8 units, 351-400=10 units, >400 =12 units 1.8 mL 0   ??? insulin glargine (LANTUS) 100 unit/mL (3 mL) injection pen Inject 0.12 mL (12 Units total) under the skin nightly. 4 mL 11   ??? lancets Misc 1 each by Miscellaneous route Four (4) times a day (before meals and nightly). 100 each 11   ??? MAGNESIUM, AMINO ACID CHELATE, 133 mg tablet TAKE 1 TABLET BY MOUTH TWICE DAILY 100 tablet PRN   ??? melatonin 3 mg Tab Take 1 tablet (3 mg total) by mouth nightly. 30 tablet 0   ??? mirtazapine (REMERON) 15 MG tablet Take 1 tablet (15 mg total) by mouth nightly. 30 tablet 3   ??? mycophenolate (CELLCEPT) 250 mg capsule Take 2 capsules (500 mg total) by mouth Two (2) times a day. 360 capsule 3   ??? NEORAL 100 mg capsule 175 mg BID (three 25 mg capsules and one 100 mg capsule two times a day) 60 capsule 11   ???  NEORAL 25 mg capsule 175 mg BID (three 25 mg capsules and one 100 mg capsule two times a day) 180 capsule 11   ??? omeprazole (PRILOSEC) 20 MG capsule Take 1 capsule (20 mg total) by mouth daily. 30 capsule 1   ??? pen needle, diabetic (ULTICARE PEN NEEDLE) 32 gauge x 5/32 Ndle 1 each by Miscellaneous route Four (4) times a day (before meals and nightly). 90 each 3   ??? traMADol (ULTRAM) 50 mg tablet Take 1 tablet (50 mg total) by mouth every six (6) hours as needed for pain. 30 tablet 0   ??? warfarin (COUMADIN) 3 MG tablet Take 3 mg by mouth nightly.        ??? cycloSPORINE modified  175 mg Oral BID   ??? digoxin  62.5 mcg G-tube Daily   ??? insulin glargine  6 Units Subcutaneous Nightly   ??? insulin lispro  0-12 Units Subcutaneous ACHS   ??? magnesium oxide  400 mg Oral Daily   ??? melatonin  3 mg Oral Nightly   ??? mirtazapine  15 mg Oral Nightly   ??? mycophenolate  500 mg Oral BID   ??? pantoprazole  20 mg Oral Daily   ??? pregabalin  50 mg Oral Nightly   ??? sildenafil (antihypertensive)  40 mg Oral TID       Allergies:  Patient has no known allergies.    Social History:  Social History     Social History   ??? Marital status: Married     Spouse name: N/A   ??? Number of children: N/A   ??? Years of education: N/A     Occupational History   ??? Not on file.     Social History Main Topics   ??? Smoking status: Never Smoker   ??? Smokeless tobacco: Never Used      Comment: Smoked in high school for about 5 years.    ??? Alcohol use No   ??? Drug use: No   ??? Sexual activity: Yes     Partners: Female     Birth control/ protection: None     Other Topics Concern   ??? Not on file     Social History Narrative   ??? No narrative on file        Objective:     Vital Signs:  Temp:  [36.3 ??C-37.1 ??C] 37.1 ??C  Heart Rate:  [58-97] 80  SpO2 Pulse:  [64-102] 78  Resp:  [7-24] 17  BP: (83-162)/(47-94) 134/66  MAP (mmHg):  [69-98] 84  SpO2:  [89 %-100 %] 97 %  BMI (Calculated):  [20.1-20.3] 20.1    Physical Exam:  Gen: NAD  HEENT: sclera anicteric  OP: MMM, clear with no erythema or ulcers  CV: RRR  Resp: CTAB without respiratory distress. Chest tube to water seal  Abd: +BS, soft, NT/ND, no HSM  Ext: no peripheral edema  Neuro: grossly unremarkable  Skin: no abrasions, erythema or rashes appreciated    Diagnostic Studies:  I reviewed all pertinent diagnostic studies, including:      Labs:    Lab Results   Component Value Date    WBC 4.4 (L) 05/25/2017    HGB 7.7 (L) 05/25/2017    HCT 24.3 (L) 05/25/2017    PLT 131 (L) 05/25/2017       Lab Results   Component Value Date    NA 134 (L) 05/25/2017    K 5.1 (H) 05/25/2017    CL 106 05/25/2017  CO2 23.0 05/25/2017    BUN 31 (H) 05/25/2017    CREATININE 1.12 05/25/2017    CALCIUM 8.9 05/25/2017    MG 2.9 (H) 05/25/2017    PHOS 3.5 05/25/2017       Lab Results   Component Value Date    ALKPHOS 91 05/25/2017    BILITOT 0.7 05/25/2017    BILIDIR <0.10 05/24/2017    PROT 4.8 (L) 05/25/2017    ALBUMIN 2.7 (L) 05/25/2017    ALT 17 (L) 05/25/2017    AST 16 (L) 05/25/2017    GGT 91 05/24/2017       Lab Results   Component Value Date    PT 13.0 (H) 05/24/2017    INR 1.14 05/24/2017    APTT 35.2 02/28/2017       Imaging:   Xr Chest Portable    Result Date: 05/25/2017  EXAM: XR CHEST PORTABLE DATE: 05/24/2017 9:04 PM ACCESSION: [id number] UN DICTATED: 05/24/2017 9:03 PM INTERPRETATION LOCATION: [facility] CLINICAL INDICATION: 67 years old Male with OTHER- chest tube-  COMPARISON: 05/24/2017 at 1:37 PM TECHNIQUE: Portable Chest Radiograph. CONCLUSION: Placement of a pigtail chest tube at the right lung base. No pneumothorax or pleural effusion. Unchanged same-day appearance of the lungs with bibasilar opacities, right greater than left. ATTENDING ADDENDUM BY DR. Margit Banda ON 05/25/2017 AT 7:53 AM: Trace right apical pneumothorax suspected.    Xr Chest Portable    Result Date: 05/25/2017  EXAM: XR CHEST PORTABLE DATE: 05/25/2017 5:57 AM ACCESSION: [id number] UN DICTATED: 05/25/2017 6:00 AM INTERPRETATION LOCATION: [facility] CLINICAL INDICATION: 67 years old Male with OTHER- Chest tube-  COMPARISON: 05/24/2017 TECHNIQUE: Portable Chest Radiograph. FINDINGS: Unchanged appearance of the pigtail chest tube at the right lung base. No pneumothorax or discernible pleural effusion. Bibasilar subsegmental atelectasis. Cardiomediastinal silhouette unchanged.     - Unchanged appearance of the pigtail chest tube at the right lung base without pneumothorax or discernible pleural effusion.    Xr Chest 1 View    Result Date: 05/24/2017  EXAM: XR CHEST 1 VIEW DATE: 05/24/2017 1:44 PM ACCESSION: [id number] UN DICTATED: 05/24/2017 2:05 PM INTERPRETATION LOCATION: [facility] CLINICAL INDICATION: 67 years old Male with PNEUMOTHORAX-  COMPARISON: Chest radiograph from 02/28/2017 and prior. TECHNIQUE: Portable AP Chest Radiograph. FINDINGS: Unchanged positioning of the leadless  pacing device. Mildly hypoinflated lungs. Subtle right greater than left lower and medial opacities, likely related to atelectasis. No pleural effusion or pneumothorax Stable cardiomediastinal silhouette. Partially imaged thoracolumbar hardware. Focal lytic lesions in right proximal humerus      Bibasilar opacities, more prominent on the right, likely related to atelectasis and hypoinflation although developing infection cannot excluded. No pneumothorax identified. Focal lytic lesions in right proximal humerus. Recommend dedicated shoulder radiographs for further assessment.     Ct Chest Abdomen Wo Contrast    Result Date: 05/25/2017  EXAM: CT Chest & Abdomen without contrast DATE: 05/24/2017 5:10 PM ACCESSION: [id number] UN DICTATED: 05/24/2017 5:50 PM INTERPRETATION LOCATION: [facility] CLINICAL INDICATION: 67 years old Male with OTHER- bleeding s/p liver biopsy-  COMPARISON: Chest radiograph from same day, MRI abdomen 05/04/2017, same day IR biopsy images TECHNIQUE: A spiral CT scan was obtained without IV contrast from the thoracic inlet through the iliac crests. Images were reconstructed in the axial plane.  Coronal and sagittal reformatted images were also provided for further evaluation. FINDINGS: AIRWAYS, LUNGS, PLEURA: Clear central tracheobronchial tree.  Right hemothorax with compressive lower lobe atelectasis. Is high density material adjacent to the right heart border, possibly in the lower mediastinum  or loculated in the pleural space. Not believed to be pericardial. Mild left lower lobe atelectasis. No pneumothorax. MEDIASTINUM: Normal heart size.  No pericardial effusion. Coronary and aortic arch calcifications. Normal caliber thoracic aorta.  No mediastinal lymphadenopathy.  Subcentimeter right thyroid lobe nodule. ABDOMEN: HEPATOBILIARY: Hypoattenuating masses in the liver, better evaluated on MRI abdomen from 05/04/2017. Biliary stent noted. Left-sided large and small right pneumobilia. PANCREAS: Fatty replacement pancreas. SPLEEN: Unremarkable. ADRENAL GLANDS: Unremarkable. KIDNEYS/URETERS: Punctate bilateral calculi. BOWEL/PERITONEUM/RETROPERITONEUM: Gastrostomy tube in the stomach. No bowel obstruction. No pneumoperitoneum.. No ascites. VASCULATURE: Scattered aortic calcifications. Unremarkable inferior vena cava. LYMPH NODES: No adenopathy. BONES/SOFT TISSUES: Lower thoracic and lumbar hardware noted.     - Right-sided hemothorax. There is hyperdense material adjacent to the right heart border, this may represent a loculated portion of right hemothorax versus blood products in the lower mediastinum. This high density material is not believed to be in the pericardial space. - Large left pneumobilia and small right pneumobilia. - No evidence of pneumoperitoneum. The findings of this study were discussed via telephone with Dr. Lissa Hoard by Dr. Riley Lam Onuscheck at 05/24/2017 6:21 PM. ATTENDING ADDENDUM BY DR. Margit Banda ON 05/25/2017 AT 7:38 AM: 10.5 x 2.4 cm hyperdense collection along the right cardiac border likely represents hemothorax and mediastinal hematoma.    Cta Chest/abd (aortic Dissection)    Result Date: 05/25/2017  EXAM: CTA for Aortic Dissection DATE: 05/24/2017 10:06 PM ACCESSION: [id number] UN DICTATED: 05/24/2017 11:05 PM INTERPRETATION LOCATION: [facility] CLINICAL INDICATION: 67 years old Male with hemothorax-  COMPARISON: CT chest and abdomen earlier same day. MRI abdomen 05/04/2016. TECHNIQUE: A spiral CT scan was obtained without IV contrast from the thoracic inlet through the hemidiaphragms. Images were reconstructed in the axial plane. Next, a spiral CTA scan was obtained with IV contrast from the thoracic inlet through the aortic bifurcation. Images were reconstructed in the axial plane.  Multiplanar reformatted and MIP images are provided for further evaluation of the aorta. VASCULAR FINDINGS: Heart size is normal. Leadless pacer device within the right ventricle. Similar appearance of high density fluid along the right mediastinal border (5:65) which was not present on recent MRI abdomen on 05/04/2017 and is favored to represent loculated blood products in the pleural space. Conventional three vessel aortic arch anatomy. Proximal great vessels are patent. Normal caliber ascending thoracic aorta. Minimal scattered atheromatous calcifications throughout the aorta. Abdominal aorta is normal in caliber. No thoracic intramural hematoma or aortic dissection. Celiac, SMA, and IMA are patent. Single bilateral renal arteries are patent. Right common iliac, external iliac, and internal iliac arteries are patent. Mild calcified and noncalcified disease is seen in the right common and internal iliac arteries. The common femoral and visualized right superficial femoral and profunda arteries are patent. Right left iliac, external iliac, and internal iliac arteries are patent. Mild calcified and noncalcified disease is seen in the left common and internal iliac arteries. The common femoral and visualized left superficial femoral and profunda arteries are patent. NONVASCULAR FINDINGS: CHEST: LUNGS/AIRWAYS/PLEURA: No endobronchial lesions. Bibasilar atelectasis, right greater than left. No pulmonary nodules. Interval placement of right basilar pigtail catheter with decrease in right hemothorax. Trace right apical pneumothorax. MEDIASTINUM/HILA:  Subcentimeter hypoenhancing right thyroid nodules. LYMPH NODES: No adenopathy. ABDOMEN/PELVIS: HEPATOBILIARY: Hypoattenuating masses in the liver, better evaluated on MRI abdomen from 05/04/2017. Linear enhancement in hepatic segment VIII adjacent to the dominant hypoenhancing lesion is favored to represent the biopsy tract (5:89). Unchanged position of common bile duct stent. Unchanged pneumobilia, left greater than right.  PANCREAS: Fatty replaced. SPLEEN: Unremarkable. ADRENAL GLANDS: Unremarkable. KIDNEYS/URETERS: Symmetric nephrograms. No enhancing renal mass. Bilateral punctate obstructing renal calculi. No hydronephrosis. BLADDER: Unremarkable. BOWEL/PERITONEUM/RETROPERITONEUM: Gastrostomy tube in the stomach. Moderate colonic stool burden. No bowel obstruction. No acute inflammatory process. No pneumoperitoneum or ascites. LYMPH NODES: No adenopathy. REPRODUCTIVE ORGANS: Unremarkable. BONES/SOFT TISSUES: Bilateral gynecomastia. Diffuse osteopenia. Degenerative changes in the spine. Mild compression deformity of the T12 vertebral body with posterior spinal fixation from T10 to L2. Posterior spinal fixation from L5 to S1 with grade 1 anterolisthesis of L5 on S1.     No evidence of active arterial extravasation. Interval placement of right basilar pigtail catheter with decrease in right hemothorax. Trace right apical pneumothorax. Unchanged loculated component of the hemothorax tracking along the right mediastinal border. Multiple hypoattenuating lesions throughout the liver with sequelae of recent biopsy of the right hepatic dome lesion. Unchanged pneumobilia, left greater than right, with common bile duct stent in place.       GI Procedures:

## 2017-05-25 NOTE — Unmapped (Signed)
Patient rounds completed. The following patient needs were addressed:  Pain, Toileting, Positioning;   SUPINE, Personal Belongings, Plan of Care, Call Bell in Reach and Bed Position Low . Wife at bedside

## 2017-05-25 NOTE — Unmapped (Signed)
Patient rounds completed. The following patient needs were addressed:  Pain, Toileting, Positioning;   SUPINE, Personal Belongings, Plan of Care, Call Bell in Reach and Bed Position Low .

## 2017-05-25 NOTE — Unmapped (Signed)
[facility]  DIVISION OF INTERVENTIONAL RADIOLOGY CONSULTATION       IR Consultation Note     REASON FOR CONSULTATION: 67 y.o. male with a recent liver biopsy, complicated by right-sided hemothorax, which he underwent a drain placement for.  He has persistent shortness of breath and now needs evaluation for a possible intervention.    BRIEF HISTORY: 67 year old male with a history of hepatitis C cirrhosis and HCC.  On most recent cross-sectional imaging, he was found to have multiple liver masses, which he underwent a biopsy for an IR on 05/24/17.  Unfortunately, the procedure was complicated by right-sided hemothorax, which interventional pulmonology placed a drain for in the emergency room.  Currently, he is complaining of persistent difficulty breathing.  VIR is being consulted to evaluate for possible intervention.    PAST MEDICAL HISTORY:  Past Medical History:   Diagnosis Date   ??? Cancer (CMS-HCC)    ??? Cirrhosis (CMS-HCC)    ??? Diabetes (CMS-HCC)    ??? Hepatitis C 07/17/2012   ??? Liver disease    ??? Low back pain 07/17/2012   ??? Varices, esophageal (CMS-HCC)        PAST SURGICAL HISTORY:  Past Surgical History:   Procedure Laterality Date   ??? ANKLE SURGERY     ??? BACK SURGERY     ??? BACK SURGERY     ??? CHG US GUIDE, TISSUE ABLATION N/A 01/22/2016    Procedure: ULTRASOUND GUIDANCE FOR, AND MONITORING OF, PARENCHYMAL TISSUE ABLATION;  Surgeon: Particia Nearing, MD;  Location: MAIN OR [facility];  Service: Transplant   ??? IR EMBOLIZATION ORGAN ISCHEMIA, TUMORS, INFAR  06/16/2016    IR EMBOLIZATION ORGAN ISCHEMIA, TUMORS, INFAR 06/16/2016 Ammie Dalton, MD IMG VIR [facility]   ??? PR COLON CA SCRN NOT HI RSK IND N/A 02/27/2015    Procedure: COLOREC CNCR SCR;COLNSCPY NO;  Surgeon: Vonda Antigua, MD;  Location: GI PROC[facility]URES [facility];  Service: Gastroenterology   ??? PR ENDOSCOPIC ULTRASOUND EXAM N/A 02/27/2015    Procedure: UGI ENDO; W/ENDO ULTRASOUND EXAM INCLUDES ESOPHAGUS, STOMACH, &/OR DUODENUM/JEJUNUM;  Surgeon: Vonda Antigua, MD;  Location: GI PROC[facility]URES [facility];  Service: Gastroenterology   ??? PR ERCP BALLOON DILATE BILIARY/PANC DUCT/AMPULLA EA N/A 02/10/2017    Procedure: ERCP;WITH TRANS-ENDOSCOPIC BALLOON DILATION OF BILIARY/PANCREATIC DUCT(S) OR OF AMPULLA, INCLUDING SPHINCTERECTOMY, WHEN PERFOREMD,EACH DUCT (16109);  Surgeon: Mayford Knife, MD;  Location: GI PROCEDURES [facility];  Service: Gastroenterology   ??? PR ERCP REMOVE FOREIGN BODY/STENT BILIARY/PANC DUCT N/A 02/10/2017    Procedure: ENDOSCOPIC RETROGRADE CHOLANGIOPANCREATOGRAPHY (ERCP); W/ REMOVAL OF FOREIGN BODY/STENT FROM BILIARY/PANCREATIC DUCT(S);  Surgeon: Mayford Knife, MD;  Location: GI PROCEDURES [facility];  Service: Gastroenterology   ??? PR ERCP STENT PLACEMENT BILIARY/PANCREATIC DUCT N/A 11/29/2016    Procedure: ENDOSCOPIC RETROGRADE CHOLANGIOPANCREATOGRAPHY (ERCP); WITH PLACEMENT OF ENDOSCOPIC STENT INTO BILIARY OR PANCREATIC DUCT;  Surgeon: Chriss Driver, MD;  Location: GI PROCEDURES [facility];  Service: Gastroenterology   ??? PR ERCP STENT PLACEMENT BILIARY/PANCREATIC DUCT N/A 04/26/2017    Procedure: ENDOSCOPIC RETROGRADE CHOLANGIOPANCREATOGRAPHY (ERCP); WITH PLACEMENT OF ENDOSCOPIC STENT INTO BILIARY OR PANCREATIC DUCT;  Surgeon: Mayford Knife, MD;  Location: GI PROCEDURES [facility];  Service: Gastroenterology   ??? PR ERCP,W/REMOVAL STONE,BIL/PANCR DUCTS N/A 11/29/2016    Procedure: ERCP; W/ENDOSCOPIC RETROGRADE REMOVAL OF CALCULUS/CALCULI FROM BILIARY &/OR PANCREATIC DUCTS;  Surgeon: Chriss Driver, MD;  Location: GI PROCEDURES [facility];  Service: Gastroenterology   ??? PR ERCP,W/REMOVAL STONE,BIL/PANCR  DUCTS N/A 02/10/2017    Procedure: ERCP; W/ENDOSCOPIC RETROGRADE REMOVAL OF CALCULUS/CALCULI FROM BILIARY &/OR PANCREATIC DUCTS;  Surgeon: Mayford Knife, MD;  Location: GI PROCEDURES [facility];  Service: Gastroenterology   ??? PR ERCP,W/REMOVAL STONE,BIL/PANCR DUCTS N/A 04/26/2017    Procedure: ERCP; W/ENDOSCOPIC RETROGRADE REMOVAL OF CALCULUS/CALCULI FROM BILIARY &/OR PANCREATIC DUCTS;  Surgeon: Mayford Knife, MD;  Location: GI PROCEDURES [facility];  Service: Gastroenterology   ??? PR INSER HEART TEMP PACER ONE CHMBR N/A 10/02/2016    Procedure: Tempoarary Pacemaker Insertion;  Surgeon: Meredith Leeds, MD;  Location: [facility];  Service: Cardiology   ??? PR LAP,DIAGNOSTIC ABDOMEN N/A 01/22/2016    Procedure: Laparoscopy, Abdomen, Peritoneum, & Omentum, Diagnostic, W/Wo Collection Specimen(S) By Brushing Or Washing;  Surgeon: Particia Nearing, MD;  Location: MAIN OR [facility];  Service: Transplant   ??? PR PLACE PERCUT GASTROSTOMY TUBE N/A 11/17/2016    Procedure: UGI ENDO; W/DIRECTED PLCMT PERQ GASTROSTOMY TUBE;  Surgeon: Cletis Athens, MD;  Location: GI PROCEDURES [facility];  Service: Gastroenterology   ??? PR TRACHEOSTOMY, PLANNED N/A 09/29/2016    Procedure: TRACHEOSTOMY PLANNED (SEPART PROC);  Surgeon: Katherina Mires, MD;  Location: MAIN OR [facility];  Service: Trauma   ??? PR TRANSCATH INSERT OR REPLACE LEADLESS PM VENTR N/A 10/11/2016    Procedure: Pacemaker Implant/Replace Leadless;  Surgeon: Meredith Leeds, MD;  Location: [facility];  Service: Cardiology   ??? 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 PR TRANSPLANT,PREP DONOR LIVER, WHOLE N/A 09/15/2016    Procedure: Rogelia Boga Std Prep Cad Donor Whole Liver Gft Prior Tnsplnt,Inc Chole,Diss/Rem Surr Tissu Wo Triseg/Lobe Splt;  Surgeon: Doyce Loose, MD;  Location: MAIN OR [facility];  Service: Transplant   ??? PR UPPER GI ENDOSCOPY,BIOPSY N/A 07/17/2012    Procedure: UGI ENDOSCOPY; WITH BIOPSY, SINGLE OR MULTIPLE;  Surgeon: Alba Destine, MD;  Location: GI PROCEDURES [facility];  Service: Gastroenterology   ??? PR UPPER GI ENDOSCOPY,DIAGNOSIS N/A 02/04/2014    Procedure: UGI ENDO, INCLUDE ESOPHAGUS, STOMACH, & DUODENUM &/OR JEJUNUM; DX W/WO COLLECTION SPECIMN, BY BRUSH OR WASH;  Surgeon: Wilburt Finlay, MD;  Location: GI PROCEDURES [facility];  Service: Gastroenterology   ??? PR UPPER GI ENDOSCOPY,LIGAT VARIX N/A 11/05/2013    Procedure: UGI ENDO; W/BAND LIG ESOPH &/OR GASTRIC VARICES;  Surgeon: Wilburt Finlay, MD;  Location: GI PROCEDURES [facility];  Service: Gastroenterology       SOCIAL HISTORY:  Social History     Social History   ??? Marital status: Married     Spouse name: N/A   ??? Number of children: N/A   ??? Years of education: N/A     Occupational History   ??? Not on file.     Social History Main Topics   ??? Smoking status: Never Smoker   ??? Smokeless tobacco: Never Used      Comment: Smoked in high school for about 5 years.    ??? Alcohol use No   ??? Drug use: No   ??? Sexual activity: Yes     Partners: Female     Birth control/ protection: None     Other Topics Concern   ??? Not on file     Social History Narrative   ??? No narrative on file  MEDICATIONS:     Current Facility-Administered Medications:   ???  acetaminophen (TYLENOL) tablet 325 mg, 325 mg, Oral, Q4H PRN, Lottie Rater, MD  ???  cycloSPORINE modified (NEORAL) capsule 175 mg, 175 mg, Oral, BID, Lottie Rater, MD, 175 mg at 05/25/17 0947  ???  dextrose 50 % in water (D50W) solution 12.5 g, 12.5 g, Intravenous, Q10 Min PRN, Lottie Rater, MD  ???  digoxin (LANOXIN) tablet 62.5 mcg, 62.5 mcg, G-tube, Daily, Lottie Rater, MD, 62.5 mcg at 05/25/17 0951  ???  insulin glargine (LANTUS) injection 6 Units, 6 Units, Subcutaneous, Nightly, Lottie Rater, MD, 6 Units at 05/25/17 0322  ???  insulin lispro (HumaLOG) injection 0-12 Units, 0-12 Units, Subcutaneous, ACHS, Lottie Rater, MD, 0 Units at 05/25/17 0946  ???  ipratropium (ATROVENT) 0.02 % nebulizer solution 500 mcg, 500 mcg, Nebulization, Q6H (RT), Leveda Anna, MD  ???  lidocaine (LIDODERM) 5 % patch 1 patch, 1 patch, Transdermal, Daily, Leveda Anna, MD  ???  magnesium oxide (MAG-OX) tablet 400 mg, 400 mg, Oral, Daily, Lottie Rater, MD, 400 mg at 05/25/17 0949  ???  melatonin tablet 3 mg, 3 mg, Oral, Nightly, Lottie Rater, MD, 3 mg at 05/25/17 0200  ???  mirtazapine (REMERON) tablet 15 mg, 15 mg, Oral, Nightly, Lottie Rater, MD, 15 mg at 05/25/17 0200  ???  MORPhine 4 mg/mL injection 2 mg, 2 mg, Intravenous, Q4H PRN, Lottie Rater, MD, 2 mg at 05/25/17 0600  ???  mycophenolate (CELLCEPT) capsule 500 mg, 500 mg, Oral, BID, Lottie Rater, MD, 500 mg at 05/25/17 1610  ???  ondansetron (ZOFRAN-ODT) disintegrating tablet 4 mg, 4 mg, Oral, Q8H PRN, Lottie Rater, MD  ???  oxyCODONE (ROXICODONE) immediate release tablet 5 mg, 5 mg, Oral, Q4H PRN, Lottie Rater, MD, 5 mg at 05/25/17 1130  ???  pantoprazole (PROTONIX) EC tablet 20 mg, 20 mg, Oral, Daily, Lottie Rater, MD, 20 mg at 05/25/17 0949  ???  polyethylene glycol (MIRALAX) packet 17 g, 17 g, Oral, Daily PRN, Lottie Rater, MD  ???  pregabalin (LYRICA) capsule 50 mg, 50 mg, Oral, Nightly, Lottie Rater, MD, 50 mg at 05/25/17 0314  ???  senna (SENOKOT) tablet 2 tablet, 2 tablet, Oral, Nightly PRN, Lottie Rater, MD  ???  sildenafil (antihypertensive) (REVATIO) tablet 40 mg, 40 mg, Oral, TID, Lottie Rater, MD, 40 mg at 05/25/17 0950  ???  traMADol (ULTRAM) tablet 50 mg, 50 mg, Oral, Q6H PRN, Lottie Rater, MD    Current Outpatient Prescriptions:   ???  sildenafil, antihypertensive, (REVATIO) 20 mg tablet, Take 40 mg by mouth Three (3) times a day., Disp: , Rfl:   ???  acetaminophen (TYLENOL) 325 MG tablet, Take 1 tablet (325 mg total) by mouth every four (4) hours as needed for pain., Disp: 100 tablet, Rfl: 2  ???  blood sugar diagnostic (FREESTYLE TEST) Strp, by Other route Four (4) times a day (before meals and nightly)., Disp: 100 each, Rfl: 11  ???  blood-glucose meter (GLUCOSE MONITORING KIT) kit, Use as instructed, Disp: 1 each, Rfl: 0  ???  cholecalciferol, vitamin D3, 2,000 unit cap, 1 capsule (2,000 Units total) by PEG Tube route daily. (Patient taking differently: Take 2,000 Units by mouth daily. ), Disp: 30 capsule, Rfl: 11  ???  digoxin (LANOXIN) 125 mcg tablet, 0.5 tablets (62.5 mcg total) by G-tube route daily., Disp: 15 tablet, Rfl: 11  ???  insulin ASPART (NOVOLOG FLEXPEN U-100 INSULIN) 100 unit/mL injection pen, Inject 0.06 mL (6 Units total) under the skin Three (3) times a day before meals., Disp: 4 mL, Rfl: 11  ???  insulin ASPART (NOVOLOG FLEXPEN) 100 unit/mL injection pen, Additional mealtime per SSI-  151-200= 2 units,  201-250= 4 units, 251-300=add 6 units, 301-350=8 units, 351-400=10 units, >400 =12 units, Disp: 1.8 mL, Rfl: 0  ???  insulin glargine (LANTUS) 100 unit/mL (3 mL) injection pen, Inject 0.12 mL (12 Units total) under the skin nightly., Disp: 4 mL, Rfl: 11  ???  lancets Misc, 1 each by Miscellaneous route Four (4) times a day (before meals and nightly)., Disp: 100 each, Rfl: 11  ???  MAGNESIUM, AMINO ACID CHELATE, 133 mg tablet, TAKE 1 TABLET BY MOUTH TWICE DAILY, Disp: 100 tablet, Rfl: PRN  ???  melatonin 3 mg Tab, Take 1 tablet (3 mg total) by mouth nightly., Disp: 30 tablet, Rfl: 0  ???  mirtazapine (REMERON) 15 MG tablet, Take 1 tablet (15 mg total) by mouth nightly., Disp: 30 tablet, Rfl: 3  ???  mycophenolate (CELLCEPT) 250 mg capsule, Take 2 capsules (500 mg total) by mouth Two (2) times a day., Disp: 360 capsule, Rfl: 3  ???  NEORAL 100 mg capsule, 175 mg BID (three 25 mg capsules and one 100 mg capsule two times a day), Disp: 60 capsule, Rfl: 11  ???  NEORAL 25 mg capsule, 175 mg BID (three 25 mg capsules and one 100 mg capsule two times a day), Disp: 180 capsule, Rfl: 11  ???  omeprazole (PRILOSEC) 20 MG capsule, Take 1 capsule (20 mg total) by mouth daily., Disp: 30 capsule, Rfl: 1  ???  pen needle, diabetic (ULTICARE PEN NEEDLE) 32 gauge x 5/32 Ndle, 1 each by Miscellaneous route Four (4) times a day (before meals and nightly)., Disp: 90 each, Rfl: 3  ???  traMADol (ULTRAM) 50 mg tablet, Take 1 tablet (50 mg total) by mouth every six (6) hours as needed for pain., Disp: 30 tablet, Rfl: 0  ???  warfarin (COUMADIN) 3 MG tablet, Take 3 mg by mouth nightly. , Disp: , Rfl:     ALLERGIES:  No Known Allergies    PHYSICAL EXAM:  Vitals:    05/25/17 1256   BP:    Pulse: 77   Resp: 19   Temp:    SpO2: 92%     General: WN 67 y.o. male in NAD. Patient feels cold.    Lungs: Shallow breaths and cannot speak in long sentences without taking a breath.     Psych/Mental Health:  Appropriate affect.           Pertinent Labs:     WBC   Date Value Ref Range Status   05/25/2017 4.4 (L) 4.5 - 11.0 10*9/L Final   01/09/2014 2.4 (L) 4.5 - 11.0 10*9/L Final     HGB   Date Value Ref Range Status   05/25/2017 7.9 (L) 13.5 - 17.5 g/dL Final     Hemoglobin   Date Value Ref Range Status   09/26/2016 6.2 (L) 13.5 - 17.5 g/dL Final     Comment:     Point of Care Testing performed at the point of care by trained personnel per documented policies.     HCT   Date Value Ref Range Status   05/25/2017 25.0 (L) 41.0 - 53.0 % Final   01/09/2014 35.4 (L) 41.0 - 53.0 % Final     Platelet  Date Value Ref Range Status   05/25/2017 131 (L) 150 - 440 10*9/L Final   01/09/2014 35 (L) 150 - 440 10*9/L Final     INR   Date Value Ref Range Status   05/24/2017 1.14  Final   01/09/2014 1.3  Final     Creatinine Whole Blood, POC   Date Value Ref Range Status   09/25/2015 1.0 0.8 - 1.4 mg/dL Final     Creatinine   Date Value Ref Range Status   05/25/2017 1.12 0.70 - 1.30 mg/dL Final         The following criteria should be met in order to perform the procedure safely:  Hgb > 7   Plt > 50k   INR < 2.0  K < 5     Imaging:   Chest xray 05/25/17:  - Unchanged appearance of the pigtail chest tube at the right lung base without pneumothorax or discernible pleural effusion.    CTA 05/24/17:  No evidence of active arterial extravasation.  Interval placement of right basilar pigtail catheter with decrease in right hemothorax. Trace right apical pneumothorax.  Unchanged loculated component of the hemothorax tracking along the right mediastinal border.  Multiple hypoattenuating lesions throughout the liver with sequelae of recent biopsy of the right hepatic dome lesion.  Unchanged pneumobilia, left greater than right, with common bile duct stent in place.     ASSESSMENT & PLAN:     67 y.o. male with an iatrogenic right-sided hemothorax.  Despite the placement of a right-sided pleural catheter, he has persistent shortness of breath and pain.  VIR is being consulted for a possible intervention.    After examining the patient, reviewing the clinical history, and reviewing his most recent imaging, we recommend infusion of TPA into the existing right-sided drain.  The existing drain remains in the right pleural space.  The most recent imaging reveals 2 small loculated areas of effusion, which are likely connected.  The areas are very high in density and likely represents blood clot, which additional drains cannot remove.  Infusion of TPA into the existing drain may help break up clots, and encourage further drainage.  Although the patient says he is experiencing some difficulty breathing and he cannot speak in long sentences, it is encouraging that he verbalizes his breathing is much improved from earlier today.    At this time, there is no indication for an intervention by VIR.  Please reconsult as needed.  This case was discussed at length with VIR attending Dr. Ammie Dalton.    A total of 20 minutes was spent face-to-face with the patient and on the patient's floor during this encounter and over half of that time was spent on counseling and coordination of care.     Jacqlyn Krauss, MD, May 25, 2017, 3:37 PM

## 2017-05-25 NOTE — Unmapped (Signed)
Pre-operative Diagnosis: Hemothorax       Post-operative Diagnosis: hemothroax    Indications: dyspnea, effusion     Monitoring:  The patient was monitored continuously by heart rate, blood pressure, pulse oximetry, and EKG tracing.     Procedure Details      Consent: After the risks benefits and alternatives of the procedure were thoroughly explained, informed consent was obtained including the risks of chest pain, cough, bleeding, infection and injury to the lung or orther organ. Immediately prior to the procedure, the time out was executed including correct patient identification and agreement on the procedure to be performed.     Using ultrasound guidance a large,isoechoic, , simple,  pleural effusion was noted.  An insertion site was marked and the area was prepped with chlorhexadine and draped in sterile fashion.  Following this 5 mL of 1% lidocaine as injected subcutaneously at the level of the 7th rib to provide topical anesthesia then deep to the pleura.  A small incision was then made parallel and superior to the rib, the seldinger needle was inserted into the chest wall and advanced under constant aspiration.  Upon aspiration of pleural fluid, the syringe was disconnected and a wire was threaded into the pleural space.  The needle was then removed and a 38F dilator was inserted.  Following removal of the dilator, a 38F Divide pneumothorax pigtail chest tube was inserted into the pleural space under wire guidance.  The chest tube was then connected to a pleuravac, sutured in place and dressed in sterile fashion.       Findings:   400 mL of hemorrhagic pleural fluid was removed. The procedure was terminated with palcement of pigtial. The fluid was sent for glucose, protein, LDH, Cell count and differential, gram stain and culture, albumin or cholesterol.     ESTIMATED BLOOD LOSS:  none    SPECIMENS REMOVED: 60 cc pleural studies sent     COMPLICATIONS: none         Condition:   Improved    Plan  CT chest pending   Maxwell Caul, MD  8:37 PM, May 24, 2017  Interventional Pulmonary Fellow  Pager #: [phone]

## 2017-05-25 NOTE — Unmapped (Signed)
Patient returned from CT Scan  Transported by Radiology  How tranported Stretcher  Cardiac Monitor yes

## 2017-05-25 NOTE — Unmapped (Signed)
Transplant Surgery Consult Note    Assessment/Plan:   67 year old male w/ a h/o hepatitis C cirrhosis and HCC s/p OLT in 09/2016, paroxysmal afib on coumadin, HFpEF, PAH, implanted pacemaker, IDDM, and CBD stones s/p ERCP who was recently found to have multiple liver masses for which he underwent a biopsy w/ IR on 05/24/17 which was complicated by right sided hemothorax s/p pigtail placement in the ER by interventional pulmonology.  The patient will need his biopsy results followed up in order to further characterize these lesions, however his primary concern right now is his respiratory status and hemothorax.  The patient is currently taking very shallow breaths due to pain from his pigtail site, and coupled w/ his baseline weaker cardiopulmonary status remains at high risk of respiratory failure.  Consequently, he will need aggressive pain control, pulmonary toilet, and close monitoring.        - Admission to at least step down unit    - Aggressive pulmonary toilet:   - q1h incentive spirometry w/ goal of >1.5L   - atrovent nebilizer scheduled q6h   - aggressive pain control, ok for PCA, lidocaine patches, and gabapentin/lyrica should patient require it to get adequate control and results on IS   - should get out of bed w/ assistance to chair and ambulate at least 3 times per day w/ nursing or physical therapy as dependent position will worsen atalectatic process from hemothorax    - Continue pigtail catheter per IP  - Agree w/ trending CBCs and holding anticoagulation until concern for bleeding ceases  - Daily chest x-rays  - Medlock, judicious IVF  - Will f/u results of biopsy  - Transplant surgery will continue to follow        History of Present Illness:     Chief Complaint: hemothorax    67 year old male w/ a h/o hepatitis C cirrhosis and HCC s/p OLT in 09/2016, paroxysmal afib on coumadin, HFpEF, PAH, implanted pacemaker, IDDM, and CBD stones s/p ERCP who was recently found to have multiple liver masses for which he underwent a biopsy w/ IR on 05/24/17 which was complicated by right sided hemothorax s/p pigtail placement in the ER by interventional pulmonology.  The patient underwent biopsy and his INR was 1.14, but he experienced a syncopal episode while he was leaving.  Hgb was found to have dropped from 10.2 preprocedure to 8.9 post procedure.  He was sent to the ER where CT scan demonstrated a right sided hemothorax.  A pigtail was placed by interventional pulmonology and f/u CT scan demonstrated no evidence of active extravasation from the liver, and interval decrease in the size of the hemothorax.  The patient's vital signs have been stable and his Hgb has slowly downtrended from 8.9 - > 8.0 -> 8.1 -> 7.7 this AM.  The chest tube has ~600 cc of output since placement.  The patient endorses significant pain on the right side of his chest that prevents him from taking deep breaths.      Prior to his biopsy, he states that he was otherwise doing well.  He was able to ambulate and his LFTs were WNL.  The patient       Allergies    Patient has no known allergies.      Medications      Current Facility-Administered Medications   Medication Dose Route Frequency Provider Last Rate Last Dose   ??? acetaminophen (TYLENOL) tablet 325 mg  325 mg Oral Q4H PRN  Lottie Rater, MD       ??? cycloSPORINE modified (NEORAL) capsule 175 mg  175 mg Oral BID Lottie Rater, MD   175 mg at 05/25/17 0947   ??? dextrose 50 % in water (D50W) solution 12.5 g  12.5 g Intravenous Q10 Min PRN Lottie Rater, MD       ??? digoxin (LANOXIN) tablet 62.5 mcg  62.5 mcg G-tube Daily Lottie Rater, MD   62.5 mcg at 05/25/17 0951   ??? insulin glargine (LANTUS) injection 6 Units  6 Units Subcutaneous Nightly Lottie Rater, MD   6 Units at 05/25/17 0322   ??? insulin lispro (HumaLOG) injection 0-12 Units  0-12 Units Subcutaneous ACHS Lottie Rater, MD   0 Units at 05/25/17 0946   ??? magnesium oxide (MAG-OX) tablet 400 mg  400 mg Oral Daily Lottie Rater, MD 400 mg at 05/25/17 0949   ??? melatonin tablet 3 mg  3 mg Oral Nightly Lottie Rater, MD   3 mg at 05/25/17 0200   ??? mirtazapine (REMERON) tablet 15 mg  15 mg Oral Nightly Lottie Rater, MD   15 mg at 05/25/17 0200   ??? MORPhine 4 mg/mL injection 2 mg  2 mg Intravenous Q4H PRN Lottie Rater, MD   2 mg at 05/25/17 0600   ??? mycophenolate (CELLCEPT) capsule 500 mg  500 mg Oral BID Lottie Rater, MD   500 mg at 05/25/17 0949   ??? ondansetron (ZOFRAN-ODT) disintegrating tablet 4 mg  4 mg Oral Q8H PRN Lottie Rater, MD       ??? oxyCODONE (ROXICODONE) immediate release tablet 5 mg  5 mg Oral Q4H PRN Lottie Rater, MD   5 mg at 05/25/17 1130   ??? pantoprazole (PROTONIX) EC tablet 20 mg  20 mg Oral Daily Lottie Rater, MD   20 mg at 05/25/17 0949   ??? polyethylene glycol (MIRALAX) packet 17 g  17 g Oral Daily PRN Lottie Rater, MD       ??? pregabalin (LYRICA) capsule 50 mg  50 mg Oral Nightly Lottie Rater, MD   50 mg at 05/25/17 0314   ??? senna (SENOKOT) tablet 2 tablet  2 tablet Oral Nightly PRN Lottie Rater, MD       ??? sildenafil (antihypertensive) (REVATIO) tablet 40 mg  40 mg Oral TID Lottie Rater, MD   40 mg at 05/25/17 0950   ??? traMADol (ULTRAM) tablet 50 mg  50 mg Oral Q6H PRN Lottie Rater, MD         Current Outpatient Prescriptions   Medication Sig Dispense Refill   ??? sildenafil, antihypertensive, (REVATIO) 20 mg tablet Take 40 mg by mouth Three (3) times a day.     ??? acetaminophen (TYLENOL) 325 MG tablet Take 1 tablet (325 mg total) by mouth every four (4) hours as needed for pain. 100 tablet 2   ??? blood sugar diagnostic (FREESTYLE TEST) Strp by Other route Four (4) times a day (before meals and nightly). 100 each 11   ??? blood-glucose meter (GLUCOSE MONITORING KIT) kit Use as instructed 1 each 0   ??? cholecalciferol, vitamin D3, 2,000 unit cap 1 capsule (2,000 Units total) by PEG Tube route daily. (Patient taking differently: Take 2,000 Units by mouth daily. ) 30 capsule 11   ??? digoxin (LANOXIN) 125 mcg tablet 0.5 tablets (62.5 mcg total) by G-tube route daily. 15 tablet 11   ???  insulin ASPART (NOVOLOG FLEXPEN U-100 INSULIN) 100 unit/mL injection pen Inject 0.06 mL (6 Units total) under the skin Three (3) times a day before meals. 4 mL 11   ??? insulin ASPART (NOVOLOG FLEXPEN) 100 unit/mL injection pen Additional mealtime per SSI-  151-200= 2 units,  201-250= 4 units, 251-300=add 6 units, 301-350=8 units, 351-400=10 units, >400 =12 units 1.8 mL 0   ??? insulin glargine (LANTUS) 100 unit/mL (3 mL) injection pen Inject 0.12 mL (12 Units total) under the skin nightly. 4 mL 11   ??? lancets Misc 1 each by Miscellaneous route Four (4) times a day (before meals and nightly). 100 each 11   ??? MAGNESIUM, AMINO ACID CHELATE, 133 mg tablet TAKE 1 TABLET BY MOUTH TWICE DAILY 100 tablet PRN   ??? melatonin 3 mg Tab Take 1 tablet (3 mg total) by mouth nightly. 30 tablet 0   ??? mirtazapine (REMERON) 15 MG tablet Take 1 tablet (15 mg total) by mouth nightly. 30 tablet 3   ??? mycophenolate (CELLCEPT) 250 mg capsule Take 2 capsules (500 mg total) by mouth Two (2) times a day. 360 capsule 3   ??? NEORAL 100 mg capsule 175 mg BID (three 25 mg capsules and one 100 mg capsule two times a day) 60 capsule 11   ??? NEORAL 25 mg capsule 175 mg BID (three 25 mg capsules and one 100 mg capsule two times a day) 180 capsule 11   ??? omeprazole (PRILOSEC) 20 MG capsule Take 1 capsule (20 mg total) by mouth daily. 30 capsule 1   ??? pen needle, diabetic (ULTICARE PEN NEEDLE) 32 gauge x 5/32 Ndle 1 each by Miscellaneous route Four (4) times a day (before meals and nightly). 90 each 3   ??? traMADol (ULTRAM) 50 mg tablet Take 1 tablet (50 mg total) by mouth every six (6) hours as needed for pain. 30 tablet 0   ??? warfarin (COUMADIN) 3 MG tablet Take 3 mg by mouth nightly.          Past Medical History    Past Medical History:   Diagnosis Date   ??? Cancer (CMS-HCC)    ??? Cirrhosis (CMS-HCC)    ??? Diabetes (CMS-HCC)    ??? Hepatitis C 07/17/2012   ??? Liver disease    ??? Low back pain 07/17/2012   ??? Varices, esophageal (CMS-HCC)          Past Surgical History    Past Surgical History:   Procedure Laterality Date   ??? ANKLE SURGERY     ??? BACK SURGERY     ??? BACK SURGERY     ??? CHG US GUIDE, TISSUE ABLATION N/A 01/22/2016    Procedure: ULTRASOUND GUIDANCE FOR, AND MONITORING OF, PARENCHYMAL TISSUE ABLATION;  Surgeon: Particia Nearing, MD;  Location: MAIN OR [facility];  Service: Transplant   ??? IR EMBOLIZATION ORGAN ISCHEMIA, TUMORS, INFAR  06/16/2016    IR EMBOLIZATION ORGAN ISCHEMIA, TUMORS, INFAR 06/16/2016 Ammie Dalton, MD IMG VIR H&V [facility]   ??? PR COLON CA SCRN NOT HI RSK IND N/A 02/27/2015    Procedure: COLOREC CNCR SCR;COLNSCPY NO;  Surgeon: Vonda Antigua, MD;  Location: GI [facility]URES [facility];  Service: Gastroenterology   ??? PR ENDOSCOPIC ULTRASOUND EXAM N/A 02/27/2015    Procedure: UGI ENDO; W/ENDO ULTRASOUND EXAM INCLUDES ESOPHAGUS, STOMACH, &/OR DUODENUM/JEJUNUM;  Surgeon: Vonda Antigua, MD;  Location: GI PROCEDURES MEMORIAL [facility];  Service: Gastroenterology   ??? PR ERCP BALLOON DILATE BILIARY/PANC DUCT/AMPULLA EA N/A 02/10/2017  Procedure: ERCP;WITH TRANS-ENDOSCOPIC BALLOON DILATION OF BILIARY/PANCREATIC DUCT(S) OR OF AMPULLA, INCLUDING SPHINCTERECTOMY, WHEN PERFOREMD,EACH DUCT ([address]);  Surgeon: Mayford Knife, MD;  Location: GI PROCEDURES MEMORIAL [facility];  Service: Gastroenterology   ??? PR ERCP REMOVE FOREIGN BODY/STENT BILIARY/PANC DUCT N/A 02/10/2017    Procedure: ENDOSCOPIC RETROGRADE CHOLANGIOPANCREATOGRAPHY (ERCP); W/ REMOVAL OF FOREIGN BODY/STENT FROM BILIARY/PANCREATIC DUCT(S);  Surgeon: Mayford Knife, MD;  Location: GI PROCEDURES MEM[facility]IAL [facility];  Service: Gastroenterology   ??? PR ERCP STENT PLACEMENT BILIARY/PANCREATIC DUCT N/A 11/29/2016    Procedure: ENDOSCOPIC RETROGRADE CHOLANGIOPANCREATOGRAPHY (ERCP); WITH PLACEMENT OF ENDOSCOPIC STENT INTO BILIARY OR PANCREATIC DUCT;  Surgeon: Chriss Driver, MD;  Location: GI PROCEDURES MEMORIAL [facility];  Service: Gastroenterology   ??? PR ERCP STENT PLACEMENT BILIARY/PANCREATIC DUCT N/A 04/26/2017    Procedure: ENDOSCOPIC RETROGRADE CHOLANGIOPANCREATOGRAPHY (ERCP); WITH PLACEMENT OF ENDOSCOPIC STENT INTO BILIARY OR PANCREATIC DUCT;  Surgeon: Mayford Knife, MD;  Location: GI PROCEDURES MEMORIAL [facility];  Service: Gastroenterology   ??? PR ERCP,W/REMOVAL STONE,BIL/PANCR DUCTS N/A 11/29/2016    Procedure: ERCP; W/ENDOSCOPIC RETROGRADE REMOVAL OF CALCULUS/CALCULI FROM BILIARY &/OR PANCREATIC DUCTS;  Surgeon: Chriss Driver, MD;  Location: GI PROCEDURES MEM[facility];  Service: Gastroenterology   ??? PR ERCP,W/REMOVAL STONE,BIL/PANCR DUCTS N/A 02/10/2017    Procedure: ERCP; W/ENDOSCOPIC RETROGRADE REMOVAL OF CALCULUS/CALCULI FROM BILIARY &/OR PANCREATIC DUCTS;  Surgeon: Mayford Knife, MD;  Location: GI PROCEDURES MEMORIAL [facility];  Service: Gastroenterology   ??? PR ERCP,W/REMOVAL STONE,BIL/PANCR DUCTS N/A 04/26/2017    Procedure: ERCP; W/ENDOSCOPIC RETROGRADE REMOVAL OF CALCULUS/CALCULI FROM BILIARY &/OR PANCREATIC DUCTS;  Surgeon: Mayford Knife, MD;  Location: GI PROCEDURES [facility]ORIAL [facility];  Service: Gastroenterology   ??? PR INSER HEART TEMP PACER ONE CHMBR N/A 10/02/2016    Procedure: Tempoarary Pacemaker Insertion;  Surgeon: Meredith Leeds, MD;  Location: [facility];  Service: Cardiology   ??? PR LAP,DIAGNOSTIC ABDOMEN N/A 01/22/2016    Procedure: Laparoscopy, Abdomen, Peritoneum, & Omentum, Diagnostic, W/Wo Collection Specimen(S) By Brushing Or Washing;  Surgeon: Particia Nearing, MD;  Location: MAIN OR [facility]  Service: Transplant   ??? PR PLACE PERCUT GASTROSTOMY TUBE N/A 11/17/2016    Procedure: UGI ENDO; W/DIRECTED PLCMT PERQ GASTROSTOMY TUBE;  Surgeon: Cletis Athens, MD;  Location: GI PROCEDURES MEMORIAL [facility];  Service: Gastroenterology   ??? PR TRACHEOSTOMY, PLANNED N/A 09/29/2016    Procedure: TRACHEOSTOMY PLANNED (SEPART PROC);  Surgeon: Katherina Mires, MD;  Location: MAIN OR [facility];  Service: Trauma   ??? PR TRANSCATH INSERT OR REPLACE LEADLESS PM VENTR N/A 10/11/2016    Procedure: Pacemaker Implant/Replace Leadless;  Surgeon: Meredith Leeds, MD;  Location: [facility];  Service: Cardiology   ??? PR TRANSPLANT LIVER,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    Procedure: Liver Allotransplantation; Orthotopic, Partial Or Whole, From Cadaver Or Living Donor, Any Age;  Surgeon: Doyce Loose, MD;  Location: MAIN OR [facility];  Service: Transplant   ??? PR TRANSPLANT,PREP DONOR LIVER, WHOLE N/A 09/15/2016    Procedure: Rogelia Boga Std Prep Cad Donor Whole Liver Gft Prior Tnsplnt,Inc Chole,Diss/Rem Surr Tissu Wo Triseg/Lobe Splt;  Surgeon: Doyce Loose, MD;  Location: MAIN OR [facility];  Service: Transplant   ??? PR UPPER GI ENDOSCOPY,BIOPSY N/A 07/17/2012    Procedure: UGI ENDOSCOPY; WITH BIOPSY, SINGLE OR MULTIPLE;  Surgeon: Alba Destine, MD;  Location: GI PROCEDURES MEMORIAL [facility];  Service: Gastroenterology   ??? PR UPPER GI ENDOSCOPY,DIAGNOSIS N/A 02/04/2014    Procedure: UGI ENDO, INCLUDE ESOPHAGUS, STOMACH, & DUODENUM &/OR JEJUNUM; DX W/WO COLLECTION SPECIMN, BY BRUSH OR WASH;  Surgeon:  Wilburt Finlay, MD;  Location: GI PROCEDURES MEMORIAL [facility];  Service: Gastroenterology   ??? PR UPPER GI ENDOSCOPY,LIGAT VARIX N/A 11/05/2013    Procedure: UGI ENDO; W/BAND LIG ESOPH &/OR GASTRIC VARICES;  Surgeon: Wilburt Finlay, MD;  Location: GI PROCEDURES MEMORIAL [facility];  Service: Gastroenterology         Family History    The patient's family history includes Hypertension in his mother..      Social History:  Social History   Substance Use Topics   ??? Smoking status: Never Smoker   ??? Smokeless tobacco: Never Used      Comment: Smoked in high school for about 5 years.    ??? Alcohol use No       Review of Systems    Review of Systems   All other systems reviewed and are negative.      Objective:     Vital Signs    Vitals:    05/25/17 0400 05/25/17 0550 05/25/17 0853 05/25/17 1040   BP: 118/65 134/66     Pulse: 83 80 72 76   Resp: 18 17 18 17    Temp:       TempSrc:       SpO2: 97% 97% 97% 96%       Wt Readings from Last 3 Encounters:   05/24/17 59.9 kg (132 lb)   05/04/17 58.7 kg (129 lb 8 oz)   05/04/17 58.7 kg (129 lb 8 oz)       Physical Exam    Physical Exam   Constitutional: He is oriented to person, place, and time. No distress.   HENT:   Head: Normocephalic and atraumatic.   Mouth/Throat: Oropharynx is clear and moist.   Eyes: Pupils are equal, round, and reactive to light. Conjunctivae and EOM are normal.   Neck: Normal range of motion. No tracheal deviation present.   Tracheal site healing, but with breathy voice   Cardiovascular: Normal rate and intact distal pulses.    Pulmonary/Chest:   Shallow breaths, diminished breath sounds on right chest, right side pigtail in place w/ bloody output approx 600 cc   Abdominal: Soft. He exhibits no distension. There is no tenderness. There is no rebound.   Musculoskeletal: Normal range of motion. He exhibits no edema or deformity.   Neurological: He is alert and oriented to person, place, and time. No cranial nerve deficit.   Skin: Skin is warm and dry. No rash noted.   Psychiatric: He has a normal mood and affect. His behavior is normal. Thought content normal.         Test Results    All lab results last 24 hours:    Recent Results (from the past 24 hour(s))   POCT Glucose    Collection Time: 05/24/17  1:30 PM   Result Value Ref Range    Glucose, POC 118 65 - 179 mg/dL   POCT Glucose    Collection Time: 05/24/17  4:23 PM   Result Value Ref Range    Glucose, POC 176 65 - 179 mg/dL   Type and Screen    Collection Time: 05/24/17  4:40 PM   Result Value Ref Range    ABO Grouping O NEG     Antibody Screen NEG    CBC    Collection Time: 05/24/17  4:40 PM   Result Value Ref Range    WBC 7.1 4.5 - 11.0 10*9/L    RBC 3.11 (L) 4.50 - 5.90 10*12/L  HGB 8.9 (L) 13.5 - 17.5 g/dL    HCT 87.5 (L) 64.3 - 53.0 %    MCV 87.3 80.0 - 100.0 fL    MCH 28.6 26.0 - 34.0 pg    MCHC 32.7 31.0 - 37.0 g/dL    RDW 32.9 51.8 - 84.1 %    MPV 8.1 7.0 - 10.0 fL    Platelet 181 150 - 440 10*9/L   Body fluid cell count    Collection Time: 05/24/17  8:26 PM   Result Value Ref Range    Fluid Type Fluid, Pleural      Color, Fluid Red     Appearance, Fluid Opaque     Nucleated Cells, Fluid 560 Undefined ul    RBC, Fluid 910,000 ul    Total Cells Counted, BF Diff 100     Neutrophil %, Fluid 28.0 %    Lymphocytes %, Fluid 27.0 %    Mono/Macro % , Fluid 45.0 %   Pleural Fluid Culture    Collection Time: 05/24/17  8:26 PM   Result Value Ref Range    Pleural Fluid Culture NO GROWTH TO DATE     Gram Stain Result No polymorphonuclear leukocytes seen     Gram Stain Result No organisms seen    Protein, Body Fluid    Collection Time: 05/24/17  8:26 PM   Result Value Ref Range    Protein, Fluid 4.5 g/dL    Protein, Fluid Type Fluid, Pleural    Lactate Dehydrogenase, Body Fluid    Collection Time: 05/24/17  8:26 PM   Result Value Ref Range    LDH, Fluid 345 U/L    LD, Fluid Type Fluid, Pleural    Albumin Level, Body Fluid    Collection Time: 05/24/17  8:26 PM   Result Value Ref Range    Albumin, Fluid 2.6 g/dL    Albumin, Fluid Type Fluid, Pleural    Cholesterol, Body Fluid    Collection Time: 05/24/17  8:26 PM   Result Value Ref Range    Cholesterol, Fluid 164 mg/dL    Chol, Fluid Type Fluid, Pleural    Glucose, Body Fluid    Collection Time: 05/24/17  8:26 PM   Result Value Ref Range    Glucose, Fluid 154 mg/dL    Glucose, Fluid Type Fluid, Pleural    Amylase, body fluid    Collection Time: 05/24/17  8:26 PM   Result Value Ref Range    Amylase, Fluid 58 U/L    Amylase, Fluid Type Fluid, Pleural    Hematocrit, Body Fluid    Collection Time: 05/24/17  8:26 PM   Result Value Ref Range    Hematocrit, Fluid 8 %    Hematocrit, Fluid Type Fluid, Pleural    CBC w/ Differential    Collection Time: 05/24/17  8:46 PM   Result Value Ref Range    WBC 5.1 4.5 - 11.0 10*9/L    RBC 2.86 (L) 4.50 - 5.90 10*12/L    HGB 8.0 (L) 13.5 - 17.5 g/dL    HCT 66.0 (L) 63.0 - 53.0 %    MCV 88.1 80.0 - 100.0 fL    MCH 28.1 26.0 - 34.0 pg    MCHC 31.9 31.0 - 37.0 g/dL    RDW 16.0 10.9 - 32.3 %    MPV 8.1 7.0 - 10.0 fL    Platelet 133 (L) 150 - 440 10*9/L    Neutrophils % 80.8 %    Lymphocytes % 12.3 %  Monocytes % 5.1 %    Eosinophils % 0.8 %    Basophils % 0.1 %    Neutrophil Left Shift 1+ (A) Not Present    Absolute Neutrophils 4.1 2.0 - 7.5 10*9/L    Absolute Lymphocytes 0.6 (L) 1.5 - 5.0 10*9/L    Absolute Monocytes 0.3 0.2 - 0.8 10*9/L    Absolute Eosinophils 0.0 0.0 - 0.4 10*9/L    Absolute Basophils 0.0 0.0 - 0.1 10*9/L    Large Unstained Cells 1 0 - 4 %    Hypochromasia Moderate (A) Not Present   Hemoglobin A1c    Collection Time: 05/24/17  8:46 PM   Result Value Ref Range    Hemoglobin A1C 6.5 (H) 4.8 - 5.6 %    Estimated Average Glucose 140 mg/dL   ECG 12 lead (Adult)    Collection Time: 05/25/17 12:56 AM   Result Value Ref Range    EKG Systolic BP  mmHg    EKG Diastolic BP  mmHg    EKG Ventricular Rate 86 BPM    EKG Atrial Rate 86 BPM    EKG P-R Interval 128 ms    EKG QRS Duration 92 ms    EKG Q-T Interval 326 ms    EKG QTC Calculation 390 ms    EKG Calculated P Axis 67 degrees    EKG Calculated R Axis 60 degrees    EKG Calculated T Axis 61 degrees   Comprehensive Metabolic Panel    Collection Time: 05/25/17  1:09 AM   Result Value Ref Range    Sodium 132 (L) 135 - 145 mmol/L    Potassium 5.5 (H) 3.5 - 5.0 mmol/L    Chloride 104 98 - 107 mmol/L    CO2 25.0 22.0 - 30.0 mmol/L    BUN 33 (H) 7 - 21 mg/dL    Creatinine 1.61 0.96 - 1.30 mg/dL    BUN/Creatinine Ratio 28     EGFR MDRD Non Af Amer >=60 >=60 mL/min/1.64m2    EGFR MDRD Af Amer >=60 >=60 mL/min/1.37m2    Anion Gap 3 (L) 9 - 15 mmol/L    Glucose 185 (H) 65 - 179 mg/dL    Calcium 8.8 8.5 - 04.5 mg/dL    Albumin 2.7 (L) 3.5 - 5.0 g/dL    Total Protein 5.0 (L) 6.5 - 8.3 g/dL    Total Bilirubin 0.8 0.0 - 1.2 mg/dL    AST 22 19 - 55 U/L    ALT 13 (L) 19 - 72 U/L    Alkaline Phosphatase 83 38 - 126 U/L   Magnesium Level    Collection Time: 05/25/17  1:09 AM   Result Value Ref Range    Magnesium 1.4 (L) 1.6 - 2.2 mg/dL   Phosphorus Level    Collection Time: 05/25/17  1:09 AM   Result Value Ref Range    Phosphorus 3.5 2.9 - 4.7 mg/dL   Hemoglobin and Hematocrit    Collection Time: 05/25/17  1:59 AM   Result Value Ref Range    HGB 8.1 (L) 13.5 - 17.5 g/dL    HCT 40.9 (L) 81.1 - 53.0 %   Comprehensive metabolic panel    Collection Time: 05/25/17  5:54 AM   Result Value Ref Range    Sodium 134 (L) 135 - 145 mmol/L    Potassium 5.1 (H) 3.5 - 5.0 mmol/L    Chloride 106 98 - 107 mmol/L    CO2 23.0 22.0 - 30.0 mmol/L  BUN 31 (H) 7 - 21 mg/dL    Creatinine 1.61 0.96 - 1.30 mg/dL    BUN/Creatinine Ratio 28     EGFR MDRD Non Af Amer >=60 >=60 mL/min/1.73m2    EGFR MDRD Af Amer >=60 >=60 mL/min/1.47m2    Anion Gap 5 (L) 9 - 15 mmol/L    Glucose 169 (H) 65 - 99 mg/dL    Calcium 8.9 8.5 - 04.5 mg/dL    Albumin 2.7 (L) 3.5 - 5.0 g/dL    Total Protein 4.8 (L) 6.5 - 8.3 g/dL    Total Bilirubin 0.7 0.0 - 1.2 mg/dL    AST 16 (L) 19 - 55 U/L    ALT 17 (L) 19 - 72 U/L    Alkaline Phosphatase 91 38 - 126 U/L   Magnesium Level    Collection Time: 05/25/17  5:54 AM   Result Value Ref Range    Magnesium 2.9 (H) 1.6 - 2.2 mg/dL   CBC w/ Differential    Collection Time: 05/25/17  5:54 AM   Result Value Ref Range    WBC 4.4 (L) 4.5 - 11.0 10*9/L    RBC 2.69 (L) 4.50 - 5.90 10*12/L    HGB 7.7 (L) 13.5 - 17.5 g/dL    HCT 40.9 (L) 81.1 - 53.0 %    MCV 90.3 80.0 - 100.0 fL    MCH 28.6 26.0 - 34.0 pg    MCHC 31.6 31.0 - 37.0 g/dL    RDW 91.4 78.2 - 95.6 %    MPV 8.4 7.0 - 10.0 fL    Platelet 131 (L) 150 - 440 10*9/L    Neutrophils % 73.0 %    Lymphocytes % 17.7 %    Monocytes % 6.3 %    Eosinophils % 1.3 %    Basophils % 0.3 %    Absolute Neutrophils 3.2 2.0 - 7.5 10*9/L    Absolute Lymphocytes 0.8 (L) 1.5 - 5.0 10*9/L    Absolute Monocytes 0.3 0.2 - 0.8 10*9/L    Absolute Eosinophils 0.1 0.0 - 0.4 10*9/L    Absolute Basophils 0.0 0.0 - 0.1 10*9/L    Large Unstained Cells 1 0 - 4 %    Hypochromasia Marked (A) Not Present   Morphology Review    Collection Time: 05/25/17  5:54 AM   Result Value Ref Range    Smear Review Comments See Comment (A) Undefined   Lipase Level    Collection Time: 05/25/17  5:54 AM   Result Value Ref Range    Lipase 283 (H) 44 - 232 U/L   POCT Glucose    Collection Time: 05/25/17  8:52 AM   Result Value Ref Range    Glucose, POC 133 65 - 179 mg/dL       Imaging: Radiology studies were personally reviewed      Problem List    Principal Problem:    Hemothorax  Active Problems:    Anemia    End-stage liver disease (CMS-HCC)    HCC (hepatocellular carcinoma) (CMS-HCC)

## 2017-05-26 DIAGNOSIS — J942 Hemothorax: Principal | ICD-10-CM

## 2017-05-26 LAB — COMPREHENSIVE METABOLIC PANEL
ALBUMIN: 2.8 g/dL — ABNORMAL LOW (ref 3.5–5.0)
ALT (SGPT): 23 U/L (ref 19–72)
ANION GAP: 6 mmol/L — ABNORMAL LOW (ref 9–15)
AST (SGOT): 11 U/L — ABNORMAL LOW (ref 19–55)
BILIRUBIN TOTAL: 0.9 mg/dL (ref 0.0–1.2)
BLOOD UREA NITROGEN: 29 mg/dL — ABNORMAL HIGH (ref 7–21)
BUN / CREAT RATIO: 22
CALCIUM: 9.6 mg/dL (ref 8.5–10.2)
CHLORIDE: 102 mmol/L (ref 98–107)
CO2: 27 mmol/L (ref 22.0–30.0)
CREATININE: 1.32 mg/dL — ABNORMAL HIGH (ref 0.70–1.30)
EGFR MDRD AF AMER: >=60 mL/min/{1.73_m2} (ref >=60)
EGFR MDRD NON AF AMER: 54 mL/min/{1.73_m2} — ABNORMAL LOW (ref >=60)
GLUCOSE RANDOM: 119 mg/dL (ref 65–179)
POTASSIUM: 5 mmol/L (ref 3.5–5.0)
PROTEIN TOTAL: 4.7 g/dL — ABNORMAL LOW (ref 6.5–8.3)
SODIUM: 135 mmol/L (ref 135–145)

## 2017-05-26 LAB — CBC W/ AUTO DIFF
BASOPHILS RELATIVE PERCENT: 0.2 %
EOSINOPHILS RELATIVE PERCENT: 1.5 %
HEMATOCRIT: 28.6 % — ABNORMAL LOW (ref 41.0–53.0)
HEMOGLOBIN: 9.1 g/dL — ABNORMAL LOW (ref 13.5–17.5)
LARGE UNSTAINED CELLS: 2 % (ref 0–4)
LYMPHOCYTES ABSOLUTE COUNT: 1 10*9/L — ABNORMAL LOW (ref 1.5–5.0)
MEAN CORPUSCULAR HEMOGLOBIN CONC: 31.8 g/dL (ref 31.0–37.0)
MEAN CORPUSCULAR HEMOGLOBIN: 28.2 pg (ref 26.0–34.0)
MEAN CORPUSCULAR VOLUME: 88.7 fL (ref 80.0–100.0)
MEAN PLATELET VOLUME: 7.8 fL (ref 7.0–10.0)
MONOCYTES ABSOLUTE COUNT: 0.3 10*9/L (ref 0.2–0.8)
MONOCYTES RELATIVE PERCENT: 7 %
NEUTROPHILS ABSOLUTE COUNT: 2.2 10*9/L (ref 2.0–7.5)
NEUTROPHILS RELATIVE PERCENT: 61.7 %
PLATELET COUNT: 129 10*9/L — ABNORMAL LOW (ref 150–440)
RED BLOOD CELL COUNT: 3.22 10*12/L — ABNORMAL LOW (ref 4.50–5.90)
RED CELL DISTRIBUTION WIDTH: 15.5 % — ABNORMAL HIGH (ref 12.0–15.0)
WBC ADJUSTED: 3.6 10*9/L — ABNORMAL LOW (ref 4.5–11.0)

## 2017-05-26 LAB — MAGNESIUM
MAGNESIUM: 1.8 mg/dL (ref 1.6–2.2)
Magnesium:MCnc:Pt:Ser/Plas:Qn:: 1.8

## 2017-05-26 LAB — INR: Lab: 1.07

## 2017-05-26 LAB — BLOOD UREA NITROGEN: Urea nitrogen:MCnc:Pt:Ser/Plas:Qn:: 29 — ABNORMAL HIGH

## 2017-05-26 LAB — PROTIME-INR: PROTIME: 12.2 s (ref 10.2–12.8)

## 2017-05-26 LAB — CYCLOSPORINE (FPIA) BLOOD: Lab: 236

## 2017-05-26 LAB — WBC ADJUSTED: Lab: 3.6 — ABNORMAL LOW

## 2017-05-26 NOTE — Unmapped (Signed)
CM received updates from [facility] resident/SarahR. She reports that pt still has a chest tube in. His pain is under control. PT is recommending 5x a week low/SNF placement, but OT has not seen pt at this time. CM will wait for OT notes and more notes from PT as txp prefers to not send post txp pts to SNFs. CM will touchbase with resident again on 3/18 and review PT/OT notes.

## 2017-05-26 NOTE — Unmapped (Signed)
Order was placed for PIV by Venous Access Team (VAT).  Patient was assessed for placement of a PIV. Access was obtained. Blood return noted.  Dressing intact and device well secured.  Flushed with normal saline.  Pt advised to inform RN of any s/s of discomfort at the PIV site.    Workup / Procedure Time:  15 minutes      The primary RN was notified.       Thank you,     Oris Drone RN Venous Access Team

## 2017-05-26 NOTE — Unmapped (Addendum)
Problem: Patient Care Overview  Goal: Plan of Care Review  Outcome: Progressing  Pt free of falls this shift. VS stable. One unit of PRBC provided this shift. Pt tolerated well. Chest tube in place to -20. Chestube flushed per order. 50ml output this shift from chest tube. Pain controlled with morphine. Accuchecks maintained and insulin provided per order. Will continue to monitor.   Goal: Individualization and Mutuality  Outcome: Progressing      Problem: Fall Risk (Adult)  Goal: Identify Related Risk Factors and Signs and Symptoms  Related risk factors and signs and symptoms are identified upon initiation of Human Response Clinical Practice Guideline (CPG).   Outcome: Progressing    Goal: Absence of Fall  Patient will demonstrate the desired outcomes by discharge/transition of care.   Outcome: Progressing      Problem: Self-Care Deficit (Adult,Obstetrics,Pediatric)  Goal: Identify Related Risk Factors and Signs and Symptoms  Related risk factors and signs and symptoms are identified upon initiation of Human Response Clinical Practice Guideline (CPG).   Outcome: Progressing    Goal: Improved Ability to Perform BADL and IADL  Patient will demonstrate the desired outcomes by discharge/transition of care.   Outcome: Progressing      Problem: Diabetes, Type 2 (Adult)  Goal: Signs and Symptoms of Listed Potential Problems Will be Absent, Minimized or Managed (Diabetes, Type 2)  Signs and symptoms of listed potential problems will be absent, minimized or managed by discharge/transition of care (reference Diabetes, Type 2 (Adult) CPG).  Outcome: Progressing      Problem: Breathing Pattern Ineffective (Adult)  Goal: Identify Related Risk Factors and Signs and Symptoms  Related risk factors and signs and symptoms are identified upon initiation of Human Response Clinical Practice Guideline (CPG).  Outcome: Progressing    Goal: Effective Oxygenation/Ventilation  Patient will demonstrate the desired outcomes by discharge/transition of care.  Outcome: Progressing    Goal: Anxiety/Fear Reduction  Patient will demonstrate the desired outcomes by discharge/transition of care.  Outcome: Progressing

## 2017-05-26 NOTE — Unmapped (Signed)
Abd [place] evaluation at bedside    Assessment/Plan:    Principal Problem:    Hemothorax  Active Problems:    Anemia    End-stage liver disease (CMS-HCC)    HCC (hepatocellular carcinoma) (CMS-HCC)  Resolved Problems:    * No resolved hospital problems. *        Pressure Ulcer(s)    Active Pressure Ulcer     None                 Jeffrey Ward is a 67 y.o. male who presented to [facility] with Hemothorax.    Abdominal ultrasound to evaluate liver hematoma possibly.    RE: due to dec hgb 7.1 with severe pain and hemothorax after liver bx.     We evaluated liver and checked in morrison's pouch no fluid, no intrahepatic hematoma noted.     Ria Dancel did most of the initial scanning we rescanned with another ultrasound until pictures we both looked at it and confirmed that there were no large hematomas, there was some air (seen is artifact A lines on the ultrasound )around the area of the anterior approach of the lower portion of the liver.    Spent total 35 to 45 minutes with the patient.  ___________________________________________________________________    Subjective:  Patient was having severe pain but reports it occurred after the tube was placed for the hemo-thorax.    The pain was mainly around the tube and worse when he breathes.  There was still blood coming out of the tube.  Patient was not comfortable we help position him flat for the final scans to ensure there was no fluid in the pocket between the liver and the kidney.    Recent Results (from the past 24 hour(s))   Body fluid cell count    Collection Time: 05/24/17  8:26 PM   Result Value Ref Range    Fluid Type Fluid, Pleural      Color, Fluid Red     Appearance, Fluid Opaque     Nucleated Cells, Fluid 560 Undefined ul    RBC, Fluid 910,000 ul    Total Cells Counted, BF Diff 100     Neutrophil %, Fluid 28.0 %    Lymphocytes %, Fluid 27.0 %    Mono/Macro % , Fluid 45.0 %   Pleural Fluid Culture    Collection Time: 05/24/17  8:26 PM   Result Value Ref Range Pleural Fluid Culture NO GROWTH TO DATE     Gram Stain Result No polymorphonuclear leukocytes seen     Gram Stain Result No organisms seen    Protein, Body Fluid    Collection Time: 05/24/17  8:26 PM   Result Value Ref Range    Protein, Fluid 4.5 g/dL    Protein, Fluid Type Fluid, Pleural    Lactate Dehydrogenase, Body Fluid    Collection Time: 05/24/17  8:26 PM   Result Value Ref Range    LDH, Fluid 345 U/L    LD, Fluid Type Fluid, Pleural    Albumin Level, Body Fluid    Collection Time: 05/24/17  8:26 PM   Result Value Ref Range    Albumin, Fluid 2.6 g/dL    Albumin, Fluid Type Fluid, Pleural    Cholesterol, Body Fluid    Collection Time: 05/24/17  8:26 PM   Result Value Ref Range    Cholesterol, Fluid 164 mg/dL    Chol, Fluid Type Fluid, Pleural    Glucose, Body Fluid  Collection Time: 05/24/17  8:26 PM   Result Value Ref Range    Glucose, Fluid 154 mg/dL    Glucose, Fluid Type Fluid, Pleural    Amylase, body fluid    Collection Time: 05/24/17  8:26 PM   Result Value Ref Range    Amylase, Fluid 58 U/L    Amylase, Fluid Type Fluid, Pleural    Hematocrit, Body Fluid    Collection Time: 05/24/17  8:26 PM   Result Value Ref Range    Hematocrit, Fluid 8 %    Hematocrit, Fluid Type Fluid, Pleural    CBC w/ Differential    Collection Time: 05/24/17  8:46 PM   Result Value Ref Range    WBC 5.1 4.5 - 11.0 10*9/L    RBC 2.86 (L) 4.50 - 5.90 10*12/L    HGB 8.0 (L) 13.5 - 17.5 g/dL    HCT 57.8 (L) 46.9 - 53.0 %    MCV 88.1 80.0 - 100.0 fL    MCH 28.1 26.0 - 34.0 pg    MCHC 31.9 31.0 - 37.0 g/dL    RDW 62.9 52.8 - 41.3 %    MPV 8.1 7.0 - 10.0 fL    Platelet 133 (L) 150 - 440 10*9/L    Neutrophils % 80.8 %    Lymphocytes % 12.3 %    Monocytes % 5.1 %    Eosinophils % 0.8 %    Basophils % 0.1 %    Neutrophil Left Shift 1+ (A) Not Present    Absolute Neutrophils 4.1 2.0 - 7.5 10*9/L    Absolute Lymphocytes 0.6 (L) 1.5 - 5.0 10*9/L    Absolute Monocytes 0.3 0.2 - 0.8 10*9/L    Absolute Eosinophils 0.0 0.0 - 0.4 10*9/L Absolute Basophils 0.0 0.0 - 0.1 10*9/L    Large Unstained Cells 1 0 - 4 %    Hypochromasia Moderate (A) Not Present   Hemoglobin A1c    Collection Time: 05/24/17  8:46 PM   Result Value Ref Range    Hemoglobin A1C 6.5 (H) 4.8 - 5.6 %    Estimated Average Glucose 140 mg/dL   ECG 12 lead (Adult)    Collection Time: 05/25/17 12:56 AM   Result Value Ref Range    EKG Systolic BP  mmHg    EKG Diastolic BP  mmHg    EKG Ventricular Rate 86 BPM    EKG Atrial Rate 86 BPM    EKG P-R Interval 128 ms    EKG QRS Duration 92 ms    EKG Q-T Interval 326 ms    EKG QTC Calculation 390 ms    EKG Calculated P Axis 67 degrees    EKG Calculated R Axis 60 degrees    EKG Calculated T Axis 61 degrees   Comprehensive Metabolic Panel    Collection Time: 05/25/17  1:09 AM   Result Value Ref Range    Sodium 132 (L) 135 - 145 mmol/L    Potassium 5.5 (H) 3.5 - 5.0 mmol/L    Chloride 104 98 - 107 mmol/L    CO2 25.0 22.0 - 30.0 mmol/L    BUN 33 (H) 7 - 21 mg/dL    Creatinine 2.44 0.10 - 1.30 mg/dL    BUN/Creatinine Ratio 28     EGFR MDRD Non Af Amer >=60 >=60 mL/min/1.65m2    EGFR MDRD Af Amer >=60 >=60 mL/min/1.48m2    Anion Gap 3 (L) 9 - 15 mmol/L    Glucose 185 (H) 65 - 179  mg/dL    Calcium 8.8 8.5 - 41.3 mg/dL    Albumin 2.7 (L) 3.5 - 5.0 g/dL    Total Protein 5.0 (L) 6.5 - 8.3 g/dL    Total Bilirubin 0.8 0.0 - 1.2 mg/dL    AST 22 19 - 55 U/L    ALT 13 (L) 19 - 72 U/L    Alkaline Phosphatase 83 38 - 126 U/L   Magnesium Level    Collection Time: 05/25/17  1:09 AM   Result Value Ref Range    Magnesium 1.4 (L) 1.6 - 2.2 mg/dL   Phosphorus Level    Collection Time: 05/25/17  1:09 AM   Result Value Ref Range    Phosphorus 3.5 2.9 - 4.7 mg/dL   Lipase Level    Collection Time: 05/25/17  1:09 AM   Result Value Ref Range    Lipase 427 (H) 44 - 232 U/L   Hemoglobin and Hematocrit    Collection Time: 05/25/17  1:59 AM   Result Value Ref Range    HGB 8.1 (L) 13.5 - 17.5 g/dL    HCT 24.4 (L) 01.0 - 53.0 %   Comprehensive metabolic panel    Collection Time: 05/25/17  5:54 AM   Result Value Ref Range    Sodium 134 (L) 135 - 145 mmol/L    Potassium 5.1 (H) 3.5 - 5.0 mmol/L    Chloride 106 98 - 107 mmol/L    CO2 23.0 22.0 - 30.0 mmol/L    BUN 31 (H) 7 - 21 mg/dL    Creatinine 2.72 5.36 - 1.30 mg/dL    BUN/Creatinine Ratio 28     EGFR MDRD Non Af Amer >=60 >=60 mL/min/1.71m2    EGFR MDRD Af Amer >=60 >=60 mL/min/1.20m2    Anion Gap 5 (L) 9 - 15 mmol/L    Glucose 169 (H) 65 - 99 mg/dL    Calcium 8.9 8.5 - 64.4 mg/dL    Albumin 2.7 (L) 3.5 - 5.0 g/dL    Total Protein 4.8 (L) 6.5 - 8.3 g/dL    Total Bilirubin 0.7 0.0 - 1.2 mg/dL    AST 16 (L) 19 - 55 U/L    ALT 17 (L) 19 - 72 U/L    Alkaline Phosphatase 91 38 - 126 U/L   Magnesium Level    Collection Time: 05/25/17  5:54 AM   Result Value Ref Range    Magnesium 2.9 (H) 1.6 - 2.2 mg/dL   CBC w/ Differential    Collection Time: 05/25/17  5:54 AM   Result Value Ref Range    WBC 4.4 (L) 4.5 - 11.0 10*9/L    RBC 2.69 (L) 4.50 - 5.90 10*12/L    HGB 7.7 (L) 13.5 - 17.5 g/dL    HCT 03.4 (L) 74.2 - 53.0 %    MCV 90.3 80.0 - 100.0 fL    MCH 28.6 26.0 - 34.0 pg    MCHC 31.6 31.0 - 37.0 g/dL    RDW 59.5 63.8 - 75.6 %    MPV 8.4 7.0 - 10.0 fL    Platelet 131 (L) 150 - 440 10*9/L    Neutrophils % 73.0 %    Lymphocytes % 17.7 %    Monocytes % 6.3 %    Eosinophils % 1.3 %    Basophils % 0.3 %    Absolute Neutrophils 3.2 2.0 - 7.5 10*9/L    Absolute Lymphocytes 0.8 (L) 1.5 - 5.0 10*9/L    Absolute Monocytes 0.3 0.2 -  0.8 10*9/L    Absolute Eosinophils 0.1 0.0 - 0.4 10*9/L    Absolute Basophils 0.0 0.0 - 0.1 10*9/L    Large Unstained Cells 1 0 - 4 %    Hypochromasia Marked (A) Not Present   Morphology Review    Collection Time: 05/25/17  5:54 AM   Result Value Ref Range    Smear Review Comments See Comment (A) Undefined   Lipase Level    Collection Time: 05/25/17  5:54 AM   Result Value Ref Range    Lipase 283 (H) 44 - 232 U/L   POCT Glucose    Collection Time: 05/25/17  8:52 AM   Result Value Ref Range    Glucose, POC 133 65 - 179 mg/dL Cyclosporine Level, Timed    Collection Time: 05/25/17  9:45 AM   Result Value Ref Range    Cyclosporine, Timed 47 ng/mL   POCT Glucose    Collection Time: 05/25/17 11:29 AM   Result Value Ref Range    Glucose, POC 131 65 - 179 mg/dL   Hemoglobin and Hematocrit    Collection Time: 05/25/17 11:32 AM   Result Value Ref Range    HGB 7.9 (L) 13.5 - 17.5 g/dL    HCT 65.7 (L) 84.6 - 53.0 %   Hemoglobin and Hematocrit    Collection Time: 05/25/17  3:59 PM   Result Value Ref Range    HGB 7.1 (L) 13.5 - 17.5 g/dL    HCT 96.2 (L) 95.2 - 53.0 %   POCT Glucose    Collection Time: 05/25/17  4:29 PM   Result Value Ref Range    Glucose, POC 121 65 - 179 mg/dL         Objective:  Temp:  [35.2 ??C (95.4 ??F)] 35.2 ??C (95.4 ??F)  Heart Rate:  [71-97] 80  SpO2 Pulse:  [64-102] 82  Resp:  [14-21] 19  BP: (102-151)/(47-87) 151/87  SpO2:  [92 %-100 %] 98 %    GEN: NAD, lying in bed uncomfortable appearing  EYES: EOMI  Morison's pouch no fluid      Largest dimension of the liver showing no hematoma, multiple scans done

## 2017-05-26 NOTE — Unmapped (Signed)
Problem: Patient Care Overview  Goal: Plan of Care Review  Outcome: Progressing   05/25/17 1726   OTHER   Plan of Care Reviewed With patient;spouse     Goal: Individualization and Mutuality  Pt received from ED accompanied by Spouse,,Alert and Oriented on Room Air,Pt c/o Rt Chest pain to the CT Site,given Oxycodone 5mg .Pt placed on MASIMO and Pleural Vac attached to wall suction at 20mg , Oriented to the room and Unit Routines.VSS,BGU WNL-will cont with the ADM Process and monitor the pt.

## 2017-05-26 NOTE — Unmapped (Signed)
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 Fib on coumadin, HFpEF and recent CBD s/p ERC admitted with hemothorax due to liver biopsy. Pigtail placed yesterday without further evidence of ongoing bleed.   Effusion with Hct of only 8%; it is possible Mr. Hemmerich did have an underlying effusion (albiet small on prior CXRs) which contributed to dyspnea. Remains hemodynamically stable.     Plan:   - please obtain daily CXR while pigtail in place.   - CT scan with reisdual effusion - cont tube to suction of -20 to help fully evacuate space  - will plan to pull once tube output decreased       This patient was seen and evaluated with Dr. Erick Colace.     Maxwell Caul, MD  7:41 PM, May 25, 2017  Interventional Pulmonary Fellow  Pager #: [phone]      History:     Requesting Physician and Service: [facility]    Reason for Consult: Hemothorax     HPI: 67 y/o male with liver transplant 09/2016 for HepC cirrhosis, A. Fib on coumadin with planned liver biopsy yesterday for 3 new liver lesions. Shorlty after proceudre Mr. Pautz had a pre-syncopal episode and RUQ pain. Also endorsed weakness and new dyspnea. CT showed new effusion concerning for hemothorax. Pigtail placed with immediate evacuation of 400 cc of hemorrhagic blood.   He did have an initial drop in his H/h however have since remained stable.   Today he feels improved; still reports significant faitgue and dyspnea only at 80% baseline. Does have some pain at chest tube site but no chest tightness associated with suction. Has had 650 output since placement.       Past Medical History:   Diagnosis Date   ??? Cancer (CMS-HCC)    ??? Cirrhosis (CMS-HCC)    ??? Diabetes (CMS-HCC)    ??? Hepatitis C 07/17/2012   ??? Liver disease    ??? Low back pain 07/17/2012   ??? Varices, esophageal (CMS-HCC)        Past Surgical History:   Procedure Laterality Date   ??? ANKLE SURGERY     ??? BACK SURGERY ??? BACK SURGERY     ??? CHG US GUIDE, TISSUE ABLATION N/A 01/22/2016    Procedure: ULTRASOUND GUIDANCE FOR, AND MONITORING OF, PARENCHYMAL TISSUE ABLATION;  Surgeon: Particia Nearing, MD;  Location: MAIN OR [facility];  Service: Transplant   ??? IR EMBOLIZATION ORGAN ISCHEMIA, TUMORS, INFAR  06/16/2016    IR EMBOLIZATION ORGAN ISCHEMIA, TUMORS, INFAR 06/16/2016 Ammie Dalton, MD IMG VIR [facility]   ??? PR COLON CA SCRN NOT HI RSK IND N/A 02/27/2015    Procedure: COLOREC CNCR SCR;COLNSCPY NO;  Surgeon: Vonda Antigua, MD;  Location: GI PROCEDURES MEMORIAL [facility];  Service: Gastroenterology   ??? PR ENDOSCOPIC ULTRASOUND EXAM N/A 02/27/2015    Procedure: UGI ENDO; W/ENDO ULTRASOUND EXAM INCLUDES ESOPHAGUS, STOMACH, &/OR DUODENUM/JEJUNUM;  Surgeon: Vonda Antigua, MD;  Location: GI PROCEDURES [facility];  Service: Gastroenterology   ??? PR ERCP BALLOON DILATE BILIARY/PANC DUCT/AMPULLA EA N/A 02/10/2017    Procedure: ERCP;WITH TRANS-ENDOSCOPIC BALLOON DILATION OF BILIARY/PANCREATIC DUCT(S) OR OF AMPULLA, INCLUDING SPHINCTERECTOMY, WHEN PERFOREMD,EACH DUCT ([id number]);  Surgeon: Mayford Knife, MD;  Location: GI PROCEDURES [facility];  Service: Gastroenterology   ??? PR ERCP REMOVE FOREIGN BODY/STENT BILIARY/PANC DUCT N/A 02/10/2017    Procedure: ENDOSCOPIC RETROGRADE CHOLANGIOPANCREATOGRAPHY (ERCP); W/  REMOVAL OF FOREIGN BODY/STENT FROM BILIARY/PANCREATIC DUCT(S);  Surgeon: Mayford Knife, MD;  Location: GI PROCEDURES MEMORIAL [facility];  Service: Gastroenterology   ??? PR ERCP STENT PLACEMENT BILIARY/PANCREATIC DUCT N/A 11/29/2016    Procedure: ENDOSCOPIC RETROGRADE CHOLANGIOPANCREATOGRAPHY (ERCP); WITH PLACEMENT OF ENDOSCOPIC STENT INTO BILIARY OR PANCREATIC DUCT;  Surgeon: Chriss Driver, MD;  Location: GI PROCEDURES MEMORIAL [facility];  Service: Gastroenterology   ??? PR ERCP STENT PLACEMENT BILIARY/PANCREATIC DUCT N/A 04/26/2017    Procedure: ENDOSCOPIC RETROGRADE CHOLANGIOPANCREATOGRAPHY (ERCP); WITH PLACEMENT OF ENDOSCOPIC STENT INTO BILIARY OR PANCREATIC DUCT;  Surgeon: Mayford Knife, MD;  Location: GI PROCEDURES [facility]ORIAL [facility];  Service: Gastroenterology   ??? PR ERCP,W/REMOVAL STONE,BIL/PANCR DUCTS N/A 11/29/2016    Procedure: ERCP; W/ENDOSCOPIC RETROGRADE REMOVAL OF CALCULUS/CALCULI FROM BILIARY &/OR PANCREATIC DUCTS;  Surgeon: Chriss Driver, MD;  Location: GI PROCEDURES MEMOR[facility];  Service: Gastroenterology   ??? PR ERCP,W/REMOVAL STONE,BIL/PANCR DUCTS N/A 02/10/2017    Procedure: ERCP; W/ENDOSCOPIC RETROGRADE REMOVAL OF CALCULUS/CALCULI FROM BILIARY &/OR PANCREATIC DUCTS;  Surgeon: Mayford Knife, MD;  Location: GI PROCEDURES MEM[facility]IAL [facility];  Service: Gastroenterology   ??? PR ERCP,W/REMOVAL STONE,BIL/PANCR DUCTS N/A 04/26/2017    Procedure: ERCP; W/ENDOSCOPIC RETROGRADE REMOVAL OF CALCULUS/CALCULI FROM BILIARY &/OR PANCREATIC DUCTS;  Surgeon: Mayford Knife, MD;  Location: GI PROCEDURES MEMORIAL [facility];  Service: Gastroenterology   ??? PR INSER HEART TEMP PACER ONE CHMBR N/A 10/02/2016    Procedure: Tempoarary Pacemaker Insertion;  Surgeon: Meredith Leeds, MD;  [facility];  Service: Cardiology   ??? PR LAP,DIAGNOSTIC ABDOMEN N/A 01/22/2016    Procedure: Laparoscopy, Abdomen, Peritoneum, & Omentum, Diagnostic, W/Wo Collection Specimen(S) By Brushing Or Washing;  Surgeon: Particia Nearing, MD;  Location: MAIN OR [facility];  Service: Transplant   ??? PR PLACE PERCUT GASTROSTOMY TUBE N/A 11/17/2016    Procedure: UGI ENDO; W/DIRECTED PLCMT PERQ GASTROSTOMY TUBE;  Surgeon: Cletis Athens, MD;  Location: GI PROCEDURES MEMORIAL [facility];  Service: Gastroenterology   ??? PR TRACHEOSTOMY, PLANNED N/A 09/29/2016    Procedure: TRACHEOSTOMY PLANNED (SEPART PROC);  Surgeon: Katherina Mires, MD;  Location: MAIN OR [facility];  Service: Trauma   ??? PR TRANSCATH INSERT OR REPLACE LEADLESS PM VENTR N/A 10/11/2016    Procedure: Pacemaker Implant/Replace Leadless;  Surgeon: Meredith Leeds, MD;  Location: [facility];  Service: Cardiology   ??? PR TRANSPLANT LIVER,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    Procedure: Liver Allotransplantation; Orthotopic, Partial Or Whole, From Cadaver Or Living Donor, Any Age;  Surgeon: Doyce Loose, MD;  Location: MAIN OR [facility];  Service: Transplant   ??? PR TRANSPLANT,PREP DONOR LIVER, WHOLE N/A 09/15/2016    Procedure: Rogelia Boga Std Prep Cad Donor Whole Liver Gft Prior Tnsplnt,Inc Chole,Diss/Rem Surr Tissu Wo Triseg/Lobe Splt;  Surgeon: Doyce Loose, MD;  Location: MAIN OR [facility];  Service: Transplant   ??? PR UPPER GI ENDOSCOPY,BIOPSY N/A 07/17/2012    Procedure: UGI ENDOSCOPY; WITH BIOPSY, SINGLE OR MULTIPLE;  Surgeon: Alba Destine, MD;  Location: GI PROCEDURES MEMORIAL [facility];  Service: Gastroenterology   ??? PR UPPER GI ENDOSCOPY,DIAGNOSIS N/A 02/04/2014    Procedure: UGI ENDO, INCLUDE ESOPHAGUS, STOMACH, & DUODENUM &/OR JEJUNUM; DX W/WO COLLECTION SPECIMN, BY BRUSH OR WASH;  Surgeon: Wilburt Finlay, MD;  Location: GI PROCEDURES [facility];  Service: Gastroenterology   ??? PR UPPER GI ENDOSCOPY,LIGAT VARIX N/A 11/05/2013    Procedure: UGI ENDO; W/BAND LIG ESOPH &/OR GASTRIC VARICES;  Surgeon: Wilburt Finlay, MD;  Location: GI PROCEDURES MEM[facility]IAL [facility];  Service: Gastroenterology  Current Facility-Administered Medications   Medication Dose Route Frequency Provider Last Rate Last Dose   ??? acetaminophen (TYLENOL) tablet 325 mg  325 mg Oral Q4H PRN Lottie Rater, MD       ??? cycloSPORINE modified (NEORAL) capsule 175 mg  175 mg Oral BID Lottie Rater, MD   175 mg at 05/25/17 0947   ??? dextrose 50 % in water (D50W) solution 12.5 g  12.5 g Intravenous Q10 Min PRN Lottie Rater, MD       ??? digoxin (LANOXIN) tablet 62.5 mcg  62.5 mcg G-tube Daily Lottie Rater, MD   62.5 mcg at 05/25/17 0951   ??? insulin glargine (LANTUS) injection 6 Units  6 Units Subcutaneous Nightly Lottie Rater, MD   6 Units at 05/25/17 0322   ??? insulin lispro (HumaLOG) injection 0-12 Units  0-12 Units Subcutaneous ACHS Lottie Rater, MD   0 Units at 05/25/17 0946   ??? ipratropium (ATROVENT) 0.02 % nebulizer solution 500 mcg  500 mcg Nebulization Q6H (RT) Leveda Anna, MD   500 mcg at 05/25/17 1642   ??? lidocaine (LIDODERM) 5 % patch 1 patch  1 patch Transdermal Daily Leveda Anna, MD   1 patch at 05/25/17 1538   ??? magnesium oxide (MAG-OX) tablet 400 mg  400 mg Oral Daily Lottie Rater, MD   400 mg at 05/25/17 0949   ??? melatonin tablet 3 mg  3 mg Oral Nightly Lottie Rater, MD   3 mg at 05/25/17 0200   ??? mirtazapine (REMERON) tablet 15 mg  15 mg Oral Nightly Lottie Rater, MD   15 mg at 05/25/17 0200   ??? MORPhine 4 mg/mL injection 2 mg  2 mg Intravenous Q4H PRN Lottie Rater, MD   2 mg at 05/25/17 0600   ??? mycophenolate (CELLCEPT) capsule 500 mg  500 mg Oral BID Lottie Rater, MD   500 mg at 05/25/17 0949   ??? ondansetron (ZOFRAN-ODT) disintegrating tablet 4 mg  4 mg Oral Q8H PRN Lottie Rater, MD       ??? oxyCODONE (ROXICODONE) immediate release tablet 5 mg  5 mg Oral Q4H PRN Lottie Rater, MD   5 mg at 05/25/17 1643   ??? pantoprazole (PROTONIX) EC tablet 20 mg  20 mg Oral Daily Lottie Rater, MD   20 mg at 05/25/17 0949   ??? polyethylene glycol (MIRALAX) packet 17 g  17 g Oral Daily PRN Lottie Rater, MD       ??? pregabalin (LYRICA) capsule 50 mg  50 mg Oral Nightly Lottie Rater, MD   50 mg at 05/25/17 0314   ??? senna (SENOKOT) tablet 2 tablet  2 tablet Oral Nightly PRN Lottie Rater, MD       ??? sildenafil (antihypertensive) (REVATIO) tablet 40 mg  40 mg Oral TID Lottie Rater, MD   40 mg at 05/25/17 0950   ??? sodium chloride (NS) 0.9 % infusion   Intravenous Continuous Leveda Anna, MD       ??? traMADol Janean Sark) tablet 50 mg  50 mg Oral Q6H PRN Lottie Rater, MD           Allergies as of 05/24/2017   ??? (No Known Allergies)       Family History   Problem Relation Age of Onset   ??? Hypertension Mother    ??? Cirrhosis Neg Hx    ??? Liver cancer Neg  Hx    ??? Anemia Neg Hx    ??? Cancer Neg Hx    ??? Diabetes Neg Hx    ??? Kidney disease Neg Hx    ??? Obesity Neg Hx    ??? Thyroid disease Neg Hx    ??? Osteoporosis Neg Hx    ??? Coronary artery disease Neg Hx    ??? Anesthesia problems Neg Hx        Social History     Social History   ??? Marital status: Married     Spouse name: N/A   ??? Number of children: N/A   ??? Years of education: N/A     Occupational History   ??? Not on file.     Social History Main Topics   ??? Smoking status: Never Smoker   ??? Smokeless tobacco: Never Used      Comment: Smoked in high school for about 5 years.    ??? Alcohol use No   ??? Drug use: No   ??? Sexual activity: Yes     Partners: Female     Birth control/ protection: None     Other Topics Concern   ??? Not on file     Social History Narrative   ??? No narrative on file       Review of Systems  A 12 point review of systems was negative except for pertinent items noted in the HPI.    Objective:     Physical Examination:   BP 151/87  - Pulse 80  - Temp 35.2 ??C (95.4 ??F) (Oral)  - Resp 19  - Wt 57.5 kg (126 lb 12.2 oz)  - SpO2 98%  - BMI 19.27 kg/m??     General appearance - alert, well appearing, and in no distress    Eyes - Sclera anicteric, conjunctiva pink  Mouth - mucous membranes moist, pharynx normal without lesions  Neck - Trachea supple and midline, (-) JVD   Lymphatics - no palpable lymphadenopathy  Heart - normal rate, regular rhythm, normal S1, S2, no murmurs, rubs, clicks or gallops  Chest - clear to auscultation, no wheezes, rales or rhonchi, symmetric air entry  Abdomen - soft, nontender, nondistended, no masses or organomegaly  Extremities - No pedal edema, no clubbing or cyanosis  Skin - normal coloration and turgor, no rashes, no suspicious skin lesions noted  Neurological - alert, oriented, normal speech, no focal findings or movement disorder noted    Labs and Imaging:    Alb 3.6  Amylase 58  Cholesterol 164  LDH 345  Protein 4.5  Hct 8%      CTA chest:   No evidence of active arterial extravasation.    Interval placement of right basilar pigtail catheter with decrease in right hemothorax. Trace right apical pneumothorax.    Unchanged loculated component of the hemothorax tracking along the right mediastinal border.    Multiple hypoattenuating lesions throughout the liver with sequelae of recent biopsy of the right hepatic dome lesion.    Unchanged pneumobilia, left greater than right, with common bile duct stent in place.

## 2017-05-26 NOTE — Unmapped (Signed)
Social Work  Psychosocial Assessment    Patient Name: Jeffrey Ward   Medical Record Number: [id number]   Date of Birth: [birth year]  Sex: Male     Referral  Referred by:  (txp)  Reason for Referral: Transplant    Discharge Planning  Discharge Planning Information:   Type of Residence: Mailing Address:  [address]  [place] [address]  Contacts:   Primary Contact Name: Antonia Culbertson  Primary Contact Relationship: Spouse  Phone #1: [phone]            Medical Information:     Past Medical History:   Diagnosis Date   ??? Cancer (CMS-HCC)    ??? Cirrhosis (CMS-HCC)    ??? Diabetes (CMS-HCC)    ??? Hepatitis C 07/17/2012   ??? Liver disease    ??? Low back pain 07/17/2012   ??? Varices, esophageal (CMS-HCC)     Social History   Substance Use Topics   ??? Smoking status: Never Smoker   ??? Smokeless tobacco: Never Used      Comment: Smoked in high school for about 5 years.    ??? Alcohol use No      Past Surgical History:   Procedure Laterality Date   ??? ANKLE SURGERY     ??? BACK SURGERY     ??? BACK SURGERY     ??? CHG US GUIDE, TISSUE ABLATION N/A 01/22/2016    Procedure: ULTRASOUND GUIDANCE FOR, AND MONITORING OF, PARENCHYMAL TISSUE ABLATION;  Surgeon: Particia Nearing, MD;  Location: MAIN OR [facility];  Service: Transplant   ??? IR EMBOLIZATION ORGAN ISCHEMIA, TUMORS, INFAR  06/16/2016    IR EMBOLIZATION ORGAN ISCHEMIA, TUMORS, INFAR 06/16/2016 Ammie Dalton, MD IMG VIR H&V [facility]   ??? PR COLON CA SCRN NOT HI RSK IND N/A 02/27/2015    Procedure: COLOREC CNCR SCR;COLNSCPY NO;  Surgeon: Vonda Antigua, MD;  Location: GI PROCEDURES MEMORIAL [facility];  Service: Gastroenterology   ??? PR ENDOSCOPIC ULTRASOUND EXAM N/A 02/27/2015    Procedure: UGI ENDO; W/ENDO ULTRASOUND EXAM INCLUDES ESOPHAGUS, STOMACH, &/OR DUODENUM/JEJUNUM;  Surgeon: Vonda Antigua, MD;  Location: GI PROCEDURES MEMORIAL [facility];  Service: Gastroenterology   ??? PR ERCP BALLOON DILATE BILIARY/PANC DUCT/AMPULLA EA N/A 02/10/2017    Procedure: ERCP;WITH TRANS-ENDOSCOPIC BALLOON DILATION OF BILIARY/PANCREATIC DUCT(S) OR OF AMPULLA, INCLUDING SPHINCTERECTOMY, WHEN PERFOREMD,EACH DUCT ([id number]);  Surgeon: Mayford Knife, MD;  Location: GI PROCEDURES MEMORIAL [facility];  Service: Gastroenterology   ??? PR ERCP REMOVE FOREIGN BODY/STENT BILIARY/PANC DUCT N/A 02/10/2017    Procedure: ENDOSCOPIC RETROGRADE CHOLANGIOPANCREATOGRAPHY (ERCP); W/ REMOVAL OF FOREIGN BODY/STENT FROM BILIARY/PANCREATIC DUCT(S);  Surgeon: Mayford Knife, MD;  Location: GI PROCEDURES MEMORIAL [facility];  Service: Gastroenterology   ??? PR ERCP STENT PLACEMENT BILIARY/PANCREATIC DUCT N/A 11/29/2016    Procedure: ENDOSCOPIC RETROGRADE CHOLANGIOPANCREATOGRAPHY (ERCP); WITH PLACEMENT OF ENDOSCOPIC STENT INTO BILIARY OR PANCREATIC DUCT;  Surgeon: Chriss Driver, MD;  Location: GI PROCEDURES [facility]ORIAL [facility];  Service: Gastroenterology   ??? PR ERCP STENT PLACEMENT BILIARY/PANCREATIC DUCT N/A 04/26/2017    Procedure: ENDOSCOPIC RETROGRADE CHOLANGIOPANCREATOGRAPHY (ERCP); WITH PLACEMENT OF ENDOSCOPIC STENT INTO BILIARY OR PANCREATIC DUCT;  Surgeon: Mayford Knife, MD;  Location: GI PROCEDURES MEMORIAL [facility];  Service: Gastroenterology   ??? PR ERCP,W/REMOVAL STONE,BIL/PANCR DUCTS N/A 11/29/2016    Procedure: ERCP; W/ENDOSCOPIC RETROGRADE REMOVAL OF CALCULUS/CALCULI FROM BILIARY &/OR PANCREATIC DUCTS;  Surgeon: Chriss Driver, MD;  Location: GI PROCEDURES MEMORIAL [facility];  Service: Gastroenterology   ??? PR ERCP,W/REMOVAL STONE,BIL/PANCR DUCTS N/A 02/10/2017    Procedure:  ERCP; W/ENDOSCOPIC RETROGRADE REMOVAL OF CALCULUS/CALCULI FROM BILIARY &/OR PANCREATIC DUCTS;  Surgeon: Mayford Knife, MD;  Location: GI PROCEDURES MEMORIAL [facility];  Service: Gastroenterology   ??? PR ERCP,W/REMOVAL STONE,BIL/PANCR DUCTS N/A 04/26/2017    Procedure: ERCP; W/ENDOSCOPIC RETROGRADE REMOVAL OF CALCULUS/CALCULI FROM BILIARY &/OR PANCREATIC DUCTS;  Surgeon: Mayford Knife, MD;  Location: GI PROCEDURES MEMORIAL [facility];  Service: Gastroenterology   ??? PR INSER HEART TEMP PACER ONE CHMBR N/A 10/02/2016    Procedure: Tempoarary Pacemaker Insertion;  Surgeon: Meredith Leeds, MD;  Location: [facility];  Service: Cardiology   ??? PR LAP,DIAGNOSTIC ABDOMEN N/A 01/22/2016    Procedure: Laparoscopy, Abdomen, Peritoneum, & Omentum, Diagnostic, W/Wo Collection Specimen(S) By Brushing Or Washing;  Surgeon: Particia Nearing, MD;  Location: MAIN OR [facility];  Service: Transplant   ??? PR PLACE PERCUT GASTROSTOMY TUBE N/A 11/17/2016    Procedure: UGI ENDO; W/DIRECTED PLCMT PERQ GASTROSTOMY TUBE;  Surgeon: Cletis Athens, MD;  Location: GI PROCEDURES MEMORIAL [facility];  Service: Gastroenterology   ??? PR TRACHEOSTOMY, PLANNED N/A 09/29/2016    Procedure: TRACHEOSTOMY PLANNED (SEPART PROC);  Surgeon: Katherina Mires, MD;  Location: MAIN OR [facility];  Service: Trauma   ??? PR TRANSCATH INSERT OR REPLACE LEADLESS PM VENTR N/A 10/11/2016    Procedure: Pacemaker Implant/Replace Leadless;  Surgeon: Meredith Leeds, MD;  Location: [facility];  Service: Cardiology   ??? PR TRANSPLANT LIVER,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    Procedure: Liver Allotransplantation; Orthotopic, Partial Or Whole, From Cadaver Or Living Donor, Any Age;  Surgeon: Doyce Loose, MD;  Location: MAIN OR [facility];  Service: Transplant   ??? PR TRANSPLANT,PREP DONOR LIVER, WHOLE N/A 09/15/2016    Procedure: Rogelia Boga Std Prep Cad Donor Whole Liver Gft Prior Tnsplnt,Inc Chole,Diss/Rem Surr Tissu Wo Triseg/Lobe Splt;  Surgeon: Doyce Loose, MD;  Location: MAIN OR [facility];  Service: Transplant   ??? PR UPPER GI ENDOSCOPY,BIOPSY N/A 07/17/2012    Procedure: UGI ENDOSCOPY; WITH BIOPSY, SINGLE OR MULTIPLE;  Surgeon: Alba Destine, MD;  Location: GI PROCEDURES MEMORIAL [facility];  Service: Gastroenterology   ??? PR UPPER GI ENDOSCOPY,DIAGNOSIS N/A 02/04/2014    Procedure: UGI ENDO, INCLUDE ESOPHAGUS, STOMACH, & DUODENUM &/OR JEJUNUM; DX W/WO COLLECTION SPECIMN, BY BRUSH OR WASH;  Surgeon: Wilburt Finlay, MD;  Location: GI PROCEDURES MEMORIAL [facility]  Service: Gastroenterology   ??? PR UPPER GI ENDOSCOPY,LIGAT VARIX N/A 11/05/2013    Procedure: UGI ENDO; W/BAND LIG ESOPH &/OR GASTRIC VARICES;  Surgeon: Wilburt Finlay, MD;  Location: GI PROCEDURES MEMORIAL [facility];  Service: Gastroenterology    Family History   Problem Relation Age of Onset   ??? Hypertension Mother    ??? Cirrhosis Neg Hx    ??? Liver cancer Neg Hx    ??? Anemia Neg Hx    ??? Cancer Neg Hx    ??? Diabetes Neg Hx    ??? Kidney disease Neg Hx    ??? Obesity Neg Hx    ??? Thyroid disease Neg Hx    ??? Osteoporosis Neg Hx    ??? Coronary artery disease Neg Hx    ??? Anesthesia problems Neg Hx           Previous admit date: 02/28/2017     Financial Information:   Primary Insurance: Payor: MEDICARE / Plan: MEDICARE PART A AND PART B / Product Type: *No Product type* /    Secondary Insurance: Secondary Conservator, museum/gallery   Prescription Coverage: Nurse, learning disability (listed above)   Preferred Pharmacy: [facility] - [place], [place] - [address]  [facility] - [place], [place] - [address]    Barriers to taking medication: No    Transition Home:   Transportation at time of discharge: Family/Friend's Private Vehicle    Anticipated changes related to Illness: tbd   Services in place prior to admission: N/A   Services anticipated for DC: tbd   Hemodialysis Prior to Admission: No    Readmission Within the Last 30 Days: no previous admission in the last 30 days    Legal NOK/Guardian/POA/AD:   Surrogate Decision Makers: At this time the patient is able to make  his own decisions.    Contact and Advanced Directives  Contact Details  Primary Contact Name: Jerimiah Wolman  Primary Contact Relationship: Spouse  Phone #1: [phone]     Advance Directive (Medical Treatment)  Does patient have an advance directive covering medical treatment?: Patient has advance directive covering medical treatment, copy in chart.  Type of advance directive: : Living will  Advance directive covering medical treatment not in Chart:: Copy requested from family  Reason patient does not have an advance directive covering medical treatment:: Patient does not wish to complete one at this time  Surrogate decision maker appointed:: No  Reason there is not a surrogate decision maker appointed:: Patient does not wish to appoint a surrogate decision maker at this time  Information provided on advance directive:: No  Patient requests assistance:: No    Advance Directive (Mental Health Treatment)  Does patient have an advance directive covering mental health treatment?: Patient does not have advance directive covering mental health treatment.  Reason patient does not have an advance directive covering mental health treatment:: Patient does not wish to complete one at this time.     Social History  Support Systems: Family Members      Education & Communication: Conservation officer, nature Service: Cytogeneticist (Retired, Discharged, Catering manager.)               Medical and Psychiatric History  Psychosocial Stressors: Comment   Comment: tbd  Psychological Issues/Information: Comment           Comment: tbd  Chemical Dependency: None              Outpatient Providers: Specialist   Name / Contact #: : [facility]  Legal: No legal issues      Ability to [employer]: No issues accessing community services

## 2017-05-26 NOTE — Unmapped (Signed)
Adult Nutrition Consult     Visit Type: MD Consult  RN Consult via Interdisciplinary Screening and Assessment Form. Identifiers: Loss of body weight without trying and Decreased appetite over the last month  Reason for Visit:  Assessment    ASSESSMENT:   HPI & PMH: Pt is a 67 yo male s/p OLT 09/2016 who presented to ED on 3/13 s/p liver biopsy c/b hemothorax  Past Medical History:   Diagnosis Date   ??? Cancer (CMS-HCC)    ??? Cirrhosis (CMS-HCC)    ??? Diabetes (CMS-HCC)    ??? Hepatitis C 07/17/2012   ??? Liver disease    ??? Low back pain 07/17/2012   ??? Varices, esophageal (CMS-HCC)    Nutrition Hx: Pt last seen in transplant clinic 05/04/17. He presented with 7.5% wt loss x 1 month and poor appetite/intake, consuming <50% of usual for ~1 week. Received swallow eval d/t concern for dysphagia; results negative. Pt was encouraged to consume at least 3 meals per day in addition to 3 ONS (Glucerna, Ensure HP, CIB Light Start).     Spoke briefly with pt today; requesting to rest at time of visit, wife not at bedside. He reports significant improvement in appetite/intake since clinic appt. Eating regular meals (meat, beans, vegetables) and drinking 2-3 Glucerna per day. Amenable to continuing supplements while admitted.  Nutritionally Pertinent Meds: Reviewed; insulin regimen, mag-ox, remeron  Labs: Reviewed  Abd/GI: no documented BMs since admission  Skin: see below  Patient Lines/Drains/Airways Status    Active Wounds     None               Current nutrition therapy order:   Nutrition Orders          Nutrition Therapy General (Regular) starting at 03/14 0140         Anthropometric Data:  -- Height:     Ht Readings from Last 1 Encounters:   05/24/17 172.7 cm (5' 8)   -- Last recorded weight: 61 kg (134 lb 7.7 oz)  -- Admission weight: 57.5 kg  -- IBW:  69.9 kg  -- Percent IBW: ~87%  -- BMI: Body mass index is 20.45 kg/m??.   -- Weight changes this admission:   Last 5 Recorded Weights    05/25/17 1622 05/26/17 0325   Weight: 57.5 kg (126 lb 12.2 oz) 61 kg (134 lb 7.7 oz)      -- Weight history PTA: per EPIC wt hx, pt has gained weight over the past month. However, his admission weight is 57.5 kg, which is likely more accurate.  Wt Readings from Last 10 Encounters:   05/26/17 61 kg (134 lb 7.7 oz)   05/24/17 59.9 kg (132 lb)   05/04/17 58.7 kg (129 lb 8 oz)   05/04/17 58.7 kg (129 lb 8 oz)   05/04/17 58.7 kg (129 lb 8 oz)   05/01/17 59.5 kg (131 lb 1.6 oz)   04/26/17 60.3 kg (133 lb)   04/21/17 60.4 kg (133 lb 1.6 oz)   04/17/17 61.6 kg (135 lb 12.8 oz)   04/11/17 61.6 kg (135 lb 12.9 oz)      Daily Estimated Nutrient Needs:   Energy: 1725-2012 kcals [30-35 kcal/kg using admission body weight, 57.5 kg (05/26/17 0902)]  Protein: 69-86 gm [1.2-1.5 gm/kg using admission body weight, 57.5 kg (05/26/17 0902)]  Carbohydrate:   [< / equal to 45% of kcal]  Fluid:   [per MD team]     Nutrition Focused Physical Exam:  unable to complete NFPE 2/2 pt requesting sleep at time of visit, laying flat. Significant losses identified during most recent exam.                    DIAGNOSIS:  Malnutrition Assessment using AND/ASPEN Clinical Characteristics:  Pt met criteria for severe protein calorie malnutrition when last evaluated. Updated diagnosis pending; will re-attempt NFPE upon follow up.                          Overall nutrition impression: pt reports significant improvement in appetite/intake over the past ~1 month. However, his weight has decreased based on admission weight of 57.5 kg. He continues to require oral nutrition supplements. Suggest Glucerna TID. Posted calorie count to assess adequacy of intake. Pt may require supplemental bolus feeds via G-tube if PO intake inadequate.    GOALS:  Oral Intake:       - Patient to consume >50% of 3 meals per day.  - Patient to consume >75% of 3 oral supplements per day.  Anthropometric:       - Maintain weight within 1-2% of UBW over course of hospitalization.     RECOMMENDATIONS AND INTERVENTIONS:  Recommend continue current nutrition therapy of Regular Diet  Recommend oral nutrition supplements : Glucerna TID  --- ordered in computrition  Request twice weekly weight(s)   Calorie count for minimum 72 hrs    RD Follow Up Parameters:  1-2 times per week (and more frequent as indicated)     Jackqulyn Livings MPH, RD, LDN  Pager: [phone]

## 2017-05-26 NOTE — Unmapped (Signed)
Daily Progress Note    Assessment/Plan:    Principal Problem:    Hemothorax  Active Problems:    Anemia    End-stage liver disease (CMS-HCC)    HCC (hepatocellular carcinoma) (CMS-HCC)  Resolved Problems:    * No resolved hospital problems. *        Pressure Ulcer(s)    Active Pressure Ulcer     None                 Jeffrey Ward??is a 67 y.o.??male??with PMHx significant for HepC cirrhosis and HCC s/p liver transplant (09/2016), paroxysmal atrial fibrillation, IDDM, H/O tracheotomy, HFpEF,PAH, recent CBD stones s/p ERCP, and peripheral neuropathy??who presents to [facility] emergency room after liver biopsy complicated by hemothorax now with chest tube by interventional pulmonology with improving pain  ??  Hemothorax: Hemoglobin stable status post 1 unit PRBC transfusion on 3/14 with appropriate response  -Continue Chest tube with management per IP -set to suction, patient tolerating well without increased pain.  Given minimal output overnight, may remove chest tube today  -Chest x-ray notes suggestion of right pericardiac/mediastinal hematoma.  CTA chest on 3/13 also notes unchanged loculated component of the hemothorax tracking along the right mediastinal border. follow-up with interventional pulmonology regarding relevance of these findings  -Daily Chest Xray   - Pain control : PRN Morphine grams every 4 hours as needed and oxycodone 5 mg every 4 hours as needed, Tylenol 325 every 4 as needed, patient endorses minimal pain around chest tube insertion site  ??  S/p OLT 09/2016: Secondary to chronic Hepatitis C and HCC. Follows with Dr. Josetta Huddle as well as [facility] transplant hepatology and oncology.  Recently found to have new liver lesions on MRI 05/04/2017 thought to be HCC versus PTLD versus fluid filled abscess now s/p liver biopsy as above.   -Follow up IR guided biopsy results   -Continue home Cyclosporine 175 mg BID   -Home Cellcept 500 mg BID   ??  Recent Choledocholithiasis: ERCP on 04/26/17 and was found to have a CBD stricture and one CBD stone. Following this procedure, his symptoms were consistent with post-ERCP pancreatitis. Hepatic function appears stable   -Was tentatively scheduled for repeat ERCP with biopsies on 3/18 -likely defer  ??  S/p tracheostomy, incompletely healed: Patient with trach tube removed 11/2016 after hypoxic respiratory failure and failed extubation attempts in setting of liver transplant.  - Will likely need to defer upcoming ENT procedures  ??  Diabetes mellitus: Complaining of recent hypoglycemia   -hemoglobin A1c 6.5  -Decrease  lantus 12->6 U nightly  -Holding lispro 6 units TIDAC  -SSI  ??  Paroxysmal atrial fibrillation: In NSR on presentation,  - Continue digoxin  - Hold warfarin given bleeding , will need to discuss with pulmonology good time to restart  - Daily PT/INR for monitoring   ??  HFpEF s/p implanted pacemaker:   - Continue to monitor  ??  Pulmonary hypertension:   -Continue sildenafil   ??  Malnutrition s/p G-tube: Not currently using   - nutrition consult to assess current status   ??  GERD:  - Home omeprazole  ??  Diet:??Regular   DVT PPx:??Not indicated  Electrolytes:??PRN  Code Status: Full   Dispo:??Floor, patient 5 times low intensity per physical therapy.  Unclear whether or not patient would be willing to go to skilled nursing facility.  Per transplant hematology social worker preferred patient avoid skilled nursing facility if possible.  We will continue to monitor and see  if patient improves over the weekend.  Follow-up occupational therapy reformations  ??  Code Status:  Full Code  ___________________________________________________________________    Subjective:  Reports feeling significantly improved with minimal pain    Recent Results (from the past 24 hour(s))   Hemoglobin and Hematocrit    Collection Time: 05/25/17  3:59 PM   Result Value Ref Range    HGB 7.1 (L) 13.5 - 17.5 g/dL    HCT 16.1 (L) 09.6 - 53.0 %   POCT Glucose    Collection Time: 05/25/17  4:29 PM   Result Value Ref Range    Glucose, POC 121 65 - 179 mg/dL   POCT Glucose    Collection Time: 05/25/17  9:08 PM   Result Value Ref Range    Glucose, POC 172 65 - 179 mg/dL   Prepare RBC    Collection Time: 05/25/17 11:09 PM   Result Value Ref Range    Crossmatch Compatible     Unit Blood Type O Neg     ISBT Number 9500     Unit # [id number]     Status Issued     Spec Expiration [id number]     Product ID Red Blood Cells     PRODUCT CODE [id number]    Comprehensive metabolic panel    Collection Time: 05/26/17  6:34 AM   Result Value Ref Range    Sodium 135 135 - 145 mmol/L    Potassium 5.0 3.5 - 5.0 mmol/L    Chloride 102 98 - 107 mmol/L    CO2 27.0 22.0 - 30.0 mmol/L    BUN 29 (H) 7 - 21 mg/dL    Creatinine 8.41 (H) 0.70 - 1.30 mg/dL    BUN/Creatinine Ratio 22     EGFR MDRD Non Af Amer 54 (L) >=60 mL/min/1.66m2    EGFR MDRD Af Amer >=60 >=60 mL/min/1.19m2    Anion Gap 6 (L) 9 - 15 mmol/L    Glucose 119 65 - 179 mg/dL    Calcium 9.6 8.5 - 32.4 mg/dL    Albumin 2.8 (L) 3.5 - 5.0 g/dL    Total Protein 4.7 (L) 6.5 - 8.3 g/dL    Total Bilirubin 0.9 0.0 - 1.2 mg/dL    AST 11 (L) 19 - 55 U/L    ALT 23 19 - 72 U/L    Alkaline Phosphatase 100 38 - 126 U/L   Magnesium Level    Collection Time: 05/26/17  6:34 AM   Result Value Ref Range    Magnesium 1.8 1.6 - 2.2 mg/dL   Protime-INR    Collection Time: 05/26/17  6:34 AM   Result Value Ref Range    PT 12.2 10.2 - 12.8 sec    INR 1.07    CBC w/ Differential    Collection Time: 05/26/17  6:34 AM   Result Value Ref Range    WBC 3.6 (L) 4.5 - 11.0 10*9/L    RBC 3.22 (L) 4.50 - 5.90 10*12/L    HGB 9.1 (L) 13.5 - 17.5 g/dL    HCT 40.1 (L) 02.7 - 53.0 %    MCV 88.7 80.0 - 100.0 fL    MCH 28.2 26.0 - 34.0 pg    MCHC 31.8 31.0 - 37.0 g/dL    RDW 25.3 (H) 66.4 - 15.0 %    MPV 7.8 7.0 - 10.0 fL    Platelet 129 (L) 150 - 440 10*9/L    Neutrophils % 61.7 %    Lymphocytes %  27.4 %    Monocytes % 7.0 %    Eosinophils % 1.5 %    Basophils % 0.2 %    Absolute Neutrophils 2.2 2.0 - 7.5 10*9/L    Absolute Lymphocytes 1.0 (L) 1.5 - 5.0 10*9/L    Absolute Monocytes 0.3 0.2 - 0.8 10*9/L    Absolute Eosinophils 0.1 0.0 - 0.4 10*9/L    Absolute Basophils 0.0 0.0 - 0.1 10*9/L    Large Unstained Cells 2 0 - 4 %   Cyclosporine Level, Timed    Collection Time: 05/26/17  6:34 AM   Result Value Ref Range    Cyclosporine, Timed 236 ng/mL   POCT Glucose    Collection Time: 05/26/17  7:36 AM   Result Value Ref Range    Glucose, POC 127 65 - 179 mg/dL   POCT Glucose    Collection Time: 05/26/17 12:46 PM   Result Value Ref Range    Glucose, POC 142 65 - 179 mg/dL     Labs/Studies:  Labs and Studies from the last 24hrs per EMR and Reviewed    Objective:  Temp:  [35.2 ??C-36.8 ??C] 36.2 ??C  Heart Rate:  [63-85] 76  Resp:  [16-20] 17  BP: (103-151)/(61-87) 103/61  SpO2:  [98 %-100 %] 98 %    GEN: NAD, seated in chair next to bed  ENT: MMM  CV: RRR  PULM: CTA B, poor respiratory effort  EXT: No edema

## 2017-05-26 NOTE — Unmapped (Signed)
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 Fib on coumadin, HFpEF and recent CBD s/p ERC admitted with hemothorax due to liver biopsy. Pigtail placed yesterday without further evidence of ongoing bleed. Pt presents to PT with deficits in functional mobility 2/2 generalized deconditioning, global weakness, gait instability, decreased endurance. Pt will benefit from skilled acute PT to address mobility deficits. Based on the AM-PAC 6 item raw score of 10/24, the patient is considered to be % impaired with basic mobility. Currently recommend low intensity post acute PT 5x/wk.    Today's Interventions: eval, am-pac 10/24, gait and transfer training, ther ex: slrs, heel slides, aps, sit <> stands, educated on importance of ther ex and oob.    Activity Tolerance: Patient tolerated treatment well;Patient limited by fatigue (limited by weakness)    PLAN  Planned Frequency of Treatment:  1-2x per day for: 4-5x week      Planned Interventions: Airway Clearance;Balance activities;Diaphragmatic / Pursed-lip breathing;Education - Patient;Education - Family / caregiver;Endurance activities;Functional mobility;Home exercise program;Gait training;Self-care / Home training;Stair training;Therapeutic exercise;Therapeutic activity;Transfer training    Post-Discharge Physical Therapy Recommendations:  5x weekly (low)        PT DME Recommendations: Defer to post acute     Goals:   Patient and Family Goals: Get my strength back    Long Term Goal #1: return to plof 12 wks       SHORT GOAL #1: Independent for all transfers              Time Frame : 2 weeks  SHORT GOAL #2: ambulate independently with rollator for 150'              Time Frame : 2 weeks  SHORT GOAL #3: up/down independently a flight of stairs              Time Frame : 2 weeks  SHORT GOAL #4: Independent with HEP              Time Frame : 2 weeks                      Prognosis:  Good  Barriers to Discharge: Decreased range of motion;Endurance deficits;Functional strength deficits;Gait instability;Impaired balance;Inability to safely perform ADLS  Positive Indicators: age, plof, motivation    SUBJECTIVE  Patient reports: I'm feeling about 40 percent  Current Functional Status: mod A for transfers, mod A and rollator for transfers     Prior functional status: Indep community ambulator with use of rollator. Indep with ADLs       Past Medical History:   Diagnosis Date   ??? Cancer (CMS-HCC)    ??? Cirrhosis (CMS-HCC)    ??? Diabetes (CMS-HCC)    ??? Hepatitis C 07/17/2012   ??? Liver disease    ??? Low back pain 07/17/2012   ??? Varices, esophageal (CMS-HCC)     Social History   Substance Use Topics   ??? Smoking status: Never Smoker   ??? Smokeless tobacco: Never Used      Comment: Smoked in high school for about 5 years.    ??? Alcohol  use No      Past Surgical History:   Procedure Laterality Date   ??? ANKLE SURGERY     ??? BACK SURGERY     ??? BACK SURGERY     ??? CHG US GUIDE, TISSUE ABLATION N/A 01/22/2016    Procedure: ULTRASOUND GUIDANCE FOR, AND MONITORING OF, PARENCHYMAL TISSUE ABLATION;  Surgeon: Particia Nearing, MD;  Location: MAIN OR [facility];  Service: Transplant   ??? IR EMBOLIZATION ORGAN ISCHEMIA, TUMORS, INFAR  06/16/2016    IR EMBOLIZATION ORGAN ISCHEMIA, TUMORS, INFAR 06/16/2016 Ammie Dalton, MD IMG VIR [facility]   ??? PR COLON CA SCRN NOT HI RSK IND N/A 02/27/2015    Procedure: COLOREC CNCR SCR;COLNSCPY NO;  Surgeon: Vonda Antigua, MD;  Location: GI PROC[facility]URES [facility];  Service: Gastroenterology   ??? PR ENDOSCOPIC ULTRASOUND EXAM N/A 02/27/2015    Procedure: UGI ENDO; W/ENDO ULTRASOUND EXAM INCLUDES ESOPHAGUS, STOMACH, &/OR DUODENUM/JEJUNUM;  Surgeon: Vonda Antigua, MD;  Location: GI PROCEDURES MEM[facility]IAL [facility];  Service: Gastroenterology ??? PR ERCP BALLOON DILATE BILIARY/PANC DUCT/AMPULLA EA N/A 02/10/2017    Procedure: ERCP;WITH TRANS-ENDOSCOPIC BALLOON DILATION OF BILIARY/PANCREATIC DUCT(S) OR OF AMPULLA, INCLUDING SPHINCTERECTOMY, WHEN PERFOREMD,EACH DUCT (35573);  Surgeon: Mayford Knife, MD;  Location: GI PROC[facility]URES [facility];  Service: Gastroenterology   ??? PR ERCP REMOVE FOREIGN BODY/STENT BILIARY/PANC DUCT N/A 02/10/2017    Procedure: ENDOSCOPIC RETROGRADE CHOLANGIOPANCREATOGRAPHY (ERCP); W/ REMOVAL OF FOREIGN BODY/STENT FROM BILIARY/PANCREATIC DUCT(S);  Surgeon: Mayford Knife, MD;  Location: GI PROCEDURES [facility];  Service: Gastroenterology   ??? PR ERCP STENT PLACEMENT BILIARY/PANCREATIC DUCT N/A 11/29/2016    Procedure: ENDOSCOPIC RETROGRADE CHOLANGIOPANCREATOGRAPHY (ERCP); WITH PLACEMENT OF ENDOSCOPIC STENT INTO BILIARY OR PANCREATIC DUCT;  Surgeon: Chriss Driver, MD;  Location: GI PROCEDURES [facility];  Service: Gastroenterology   ??? PR ERCP STENT PLACEMENT BILIARY/PANCREATIC DUCT N/A 04/26/2017    Procedure: ENDOSCOPIC RETROGRADE CHOLANGIOPANCREATOGRAPHY (ERCP); WITH PLACEMENT OF ENDOSCOPIC STENT INTO BILIARY OR PANCREATIC DUCT;  Surgeon: Mayford Knife, MD;  Location: GI PROCEDURES [facility];  Service: Gastroenterology   ??? PR ERCP,W/REMOVAL STONE,BIL/PANCR DUCTS N/A 11/29/2016    Procedure: ERCP; W/ENDOSCOPIC RETROGRADE REMOVAL OF CALCULUS/CALCULI FROM BILIARY &/OR PANCREATIC DUCTS;  Surgeon: Chriss Driver, MD;  Location: GI PROCEDURES [facility];  Service: Gastroenterology   ??? PR ERCP,W/REMOVAL STONE,BIL/PANCR DUCTS N/A 02/10/2017    Procedure: ERCP; W/ENDOSCOPIC RETROGRADE REMOVAL OF CALCULUS/CALCULI FROM BILIARY &/OR PANCREATIC DUCTS;  Surgeon: Mayford Knife, MD;  Location: GI PROCEDURES [facility];  Service: Gastroenterology   ??? PR ERCP,W/REMOVAL STONE,BIL/PANCR DUCTS N/A 04/26/2017    Procedure: ERCP; W/ENDOSCOPIC RETROGRADE REMOVAL OF CALCULUS/CALCULI FROM BILIARY &/OR PANCREATIC DUCTS; Surgeon: Mayford Knife, MD;  Location: GI PROCEDURES [facility];  Service: Gastroenterology   ??? PR INSER HEART TEMP PACER ONE CHMBR N/A 10/02/2016    Procedure: Tempoarary Pacemaker Insertion;  Surgeon: Meredith Leeds, MD;  Location: [facility] EP;  Service: Cardiology   ??? PR LAP,DIAGNOSTIC ABDOMEN N/A 01/22/2016    Procedure: Laparoscopy, Abdomen, Peritoneum, & Omentum, Diagnostic, W/Wo Collection Specimen(S) By Brushing Or Washing;  Surgeon: Particia Nearing, MD;  Location: MAIN OR [facility];  Service: Transplant   ??? PR PLACE PERCUT GASTROSTOMY TUBE N/A 11/17/2016    Procedure: UGI ENDO; W/DIRECTED PLCMT PERQ GASTROSTOMY TUBE;  Surgeon: Cletis Athens, MD;  Location: GI PROCEDURES [facility];  Service: Gastroenterology   ??? PR TRACHEOSTOMY, PLANNED N/A 09/29/2016    Procedure: TRACHEOSTOMY PLANNED (SEPART PROC);  Surgeon: Katherina Mires, MD;  Location: MAIN OR  [facility];  Service: Trauma   ??? PR TRANSCATH INSERT OR REPLACE LEADLESS PM VENTR N/A 10/11/2016    Procedure: Pacemaker Implant/Replace Leadless;  Surgeon: Meredith Leeds, MD;  Location: [facility] EP;  Service: Cardiology   ??? PR TRANSPLANT LIVER,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    Procedure: Liver Allotransplantation; Orthotopic, Partial Or Whole, From Cadaver Or Living Donor, Any Age;  Surgeon: Doyce Loose, MD;  Location: MAIN OR [facility];  Service: Transplant   ??? PR TRANSPLANT,PREP DONOR LIVER, WHOLE N/A 09/15/2016    Procedure: Rogelia Boga Std Prep Cad Donor Whole Liver Gft Prior Tnsplnt,Inc Chole,Diss/Rem Surr Tissu Wo Triseg/Lobe Splt;  Surgeon: Doyce Loose, MD;  Location: MAIN OR [facility];  Service: Transplant   ??? PR UPPER GI ENDOSCOPY,BIOPSY N/A 07/17/2012    Procedure: UGI ENDOSCOPY; WITH BIOPSY, SINGLE OR MULTIPLE;  Surgeon: Alba Destine, MD;  Location: GI PROC[facility]URES [facility];  Service: Gastroenterology   ??? PR UPPER GI ENDOSCOPY,DIAGNOSIS N/A 02/04/2014    Procedure: UGI ENDO, INCLUDE ESOPHAGUS, STOMACH, & DUODENUM &/OR JEJUNUM; DX W/WO COLLECTION SPECIMN, BY BRUSH OR WASH;  Surgeon: Wilburt Finlay, MD;  Location: GI PROCEDURES [facility];  Service: Gastroenterology   ??? PR UPPER GI ENDOSCOPY,LIGAT VARIX N/A 11/05/2013    Procedure: UGI ENDO; W/BAND LIG ESOPH &/OR GASTRIC VARICES;  Surgeon: Wilburt Finlay, MD;  Location: GI PROCEDURES [facility];  Service: Gastroenterology    Family History   Problem Relation Age of Onset   ??? Hypertension Mother    ??? Cirrhosis Neg Hx    ??? Liver cancer Neg Hx    ??? Anemia Neg Hx    ??? Cancer Neg Hx    ??? Diabetes Neg Hx    ??? Kidney disease Neg Hx    ??? Obesity Neg Hx    ??? Thyroid disease Neg Hx    ??? Osteoporosis Neg Hx    ??? Coronary artery disease Neg Hx    ??? Anesthesia problems Neg Hx         Allergies: Patient has no known allergies.                Objective Findings              Precautions: Non-applicable              Weight Bearing Status: Non- applicable              Required Braces or Orthoses: Non- applicable    Communication Preference: Verbal;Visual  Pain Comments: no c/o pain  Medical Tests / Procedures: reviewed in epic  Equipment / Environment: Chest tube(s);Vascular access (PIV, TLC, Port-a-cath, PICC);Foley (right CT)    At Rest: VSS pertelmetry  With Activity: NAD  Orthostatics: no s/sx  Airway Clearance: mobilization    Living environment: House  Lives With: Spouse;Other (gransdson)  Home Living: One level home;Grab bars around toilet;Grab bars in shower;Walk-in shower;Garden tub;Built-in shower seat;Standard height toilet;Stairs to enter with rails  Rail placement (outside): Rail on left side     Number of Stairs: 7    Cognition: A,O x 4          UE ROM: wfl  UE Strength: 2/5  LE ROM: wfls  LE Strength: 2/5                          Balance: mod A with rollator         Bed Mobility: mod A  Transfers: mod A for sup <>  sit <> stand   Gait: mod A and rollator for 8' x 3', requires mod A 2/2 weakness   Stairs: NT      Endurance: poor    Eval Duration(PT): 40 Min.          I attest that I have reviewed the above information.  Signed: Daiva Huge, PT  Filed 05/26/2017

## 2017-05-27 DIAGNOSIS — J942 Hemothorax: Principal | ICD-10-CM

## 2017-05-27 LAB — CO2: Carbon dioxide:SCnc:Pt:Ser/Plas:Qn:: 25

## 2017-05-27 LAB — ANISOCYTOSIS

## 2017-05-27 LAB — CBC
HEMATOCRIT: 25.7 % — ABNORMAL LOW (ref 41.0–53.0)
HEMOGLOBIN: 8.3 g/dL — ABNORMAL LOW (ref 13.5–17.5)
MEAN CORPUSCULAR HEMOGLOBIN CONC: 32.1 g/dL (ref 31.0–37.0)
MEAN CORPUSCULAR HEMOGLOBIN: 28.5 pg (ref 26.0–34.0)
MEAN CORPUSCULAR VOLUME: 88.8 fL (ref 80.0–100.0)
MEAN PLATELET VOLUME: 7.9 fL (ref 7.0–10.0)
PLATELET COUNT: 129 10*9/L — ABNORMAL LOW (ref 150–440)
RED BLOOD CELL COUNT: 2.89 10*12/L — ABNORMAL LOW (ref 4.50–5.90)
RED CELL DISTRIBUTION WIDTH: 15.9 % — ABNORMAL HIGH (ref 12.0–15.0)
WBC ADJUSTED: 4.4 10*9/L — ABNORMAL LOW (ref 4.5–11.0)

## 2017-05-27 LAB — CBC W/ AUTO DIFF
BASOPHILS ABSOLUTE COUNT: 0 10*9/L (ref 0.0–0.1)
BASOPHILS RELATIVE PERCENT: 0.3 %
EOSINOPHILS ABSOLUTE COUNT: 0.1 10*9/L (ref 0.0–0.4)
HEMATOCRIT: 25.7 % — ABNORMAL LOW (ref 41.0–53.0)
HEMOGLOBIN: 8.3 g/dL — ABNORMAL LOW (ref 13.5–17.5)
LARGE UNSTAINED CELLS: 2 % (ref 0–4)
LYMPHOCYTES ABSOLUTE COUNT: 1 10*9/L — ABNORMAL LOW (ref 1.5–5.0)
LYMPHOCYTES RELATIVE PERCENT: 23.6 %
MEAN CORPUSCULAR HEMOGLOBIN CONC: 32.3 g/dL (ref 31.0–37.0)
MEAN CORPUSCULAR HEMOGLOBIN: 28.6 pg (ref 26.0–34.0)
MEAN CORPUSCULAR VOLUME: 88.4 fL (ref 80.0–100.0)
MEAN PLATELET VOLUME: 7.9 fL (ref 7.0–10.0)
MONOCYTES ABSOLUTE COUNT: 0.2 10*9/L (ref 0.2–0.8)
MONOCYTES RELATIVE PERCENT: 5.4 %
NEUTROPHILS ABSOLUTE COUNT: 3 10*9/L (ref 2.0–7.5)
PLATELET COUNT: 159 10*9/L (ref 150–440)
RED BLOOD CELL COUNT: 2.9 10*12/L — ABNORMAL LOW (ref 4.50–5.90)
RED CELL DISTRIBUTION WIDTH: 16 % — ABNORMAL HIGH (ref 12.0–15.0)
WBC ADJUSTED: 4.4 10*9/L — ABNORMAL LOW (ref 4.5–11.0)

## 2017-05-27 LAB — COMPREHENSIVE METABOLIC PANEL
ALBUMIN: 2.7 g/dL — ABNORMAL LOW (ref 3.5–5.0)
ALT (SGPT): 14 U/L — ABNORMAL LOW (ref 19–72)
ANION GAP: 7 mmol/L — ABNORMAL LOW (ref 9–15)
AST (SGOT): 10 U/L — ABNORMAL LOW (ref 19–55)
BILIRUBIN TOTAL: 0.5 mg/dL (ref 0.0–1.2)
BLOOD UREA NITROGEN: 39 mg/dL — ABNORMAL HIGH (ref 7–21)
BUN / CREAT RATIO: 23
CALCIUM: 9.5 mg/dL (ref 8.5–10.2)
CHLORIDE: 101 mmol/L (ref 98–107)
CO2: 27 mmol/L (ref 22.0–30.0)
EGFR MDRD AF AMER: 49 mL/min/{1.73_m2} — ABNORMAL LOW (ref >=60)
EGFR MDRD NON AF AMER: 41 mL/min/{1.73_m2} — ABNORMAL LOW (ref >=60)
GLUCOSE RANDOM: 148 mg/dL (ref 65–179)
POTASSIUM: 5.3 mmol/L — ABNORMAL HIGH (ref 3.5–5.0)
PROTEIN TOTAL: 4.8 g/dL — ABNORMAL LOW (ref 6.5–8.3)
SODIUM: 135 mmol/L (ref 135–145)

## 2017-05-27 LAB — BASIC METABOLIC PANEL
ANION GAP: 7 mmol/L — ABNORMAL LOW (ref 9–15)
BLOOD UREA NITROGEN: 40 mg/dL — ABNORMAL HIGH (ref 7–21)
BUN / CREAT RATIO: 25
CALCIUM: 9.2 mg/dL (ref 8.5–10.2)
CO2: 25 mmol/L (ref 22.0–30.0)
CREATININE: 1.61 mg/dL — ABNORMAL HIGH (ref 0.70–1.30)
EGFR MDRD NON AF AMER: 43 mL/min/{1.73_m2} — ABNORMAL LOW (ref >=60)
GLUCOSE RANDOM: 178 mg/dL (ref 65–179)
POTASSIUM: 5.4 mmol/L — ABNORMAL HIGH (ref 3.5–5.0)

## 2017-05-27 LAB — WBC ADJUSTED: Lab: 4.4 — ABNORMAL LOW

## 2017-05-27 LAB — GLUCOSE RANDOM: Glucose:MCnc:Pt:Ser/Plas:Qn:: 148

## 2017-05-27 LAB — MAGNESIUM: Magnesium:MCnc:Pt:Ser/Plas:Qn:: 1.7

## 2017-05-27 LAB — PROTIME: Lab: 12.3

## 2017-05-27 NOTE — Unmapped (Signed)
Daily Progress Note    Assessment/Plan:    Principal Problem:    Hemothorax  Active Problems:    Anemia    End-stage liver disease (CMS-HCC)    HCC (hepatocellular carcinoma) (CMS-HCC)  Resolved Problems:    * No resolved hospital problems. *        Pressure Ulcer(s)    Active Pressure Ulcer     None                 Clell Apsey??is a 67 y.o.??male??with PMHx significant for HepC cirrhosis and HCC s/p liver transplant (09/2016), paroxysmal atrial fibrillation, IDDM, H/O tracheotomy, HFpEF,PAH, recent CBD stones s/p ERCP, and peripheral neuropathy??who presents to [facility] emergency room after liver biopsy complicated by hemothorax now s/p chest tube by interventional pulmonology and with improving pain  ??  Hemothorax: Chest tube set to suction x2 days and then removed by IP on 3/15. Patient now tolerating pain well.  - small drop in Hbg on AM labs 3/16, f/u CBC/CXR  - Pain control : PRN Morphine grams every 4 hours as needed and oxycodone 5 mg every 4 hours as needed, Tylenol 325 every 4 as needed, patient endorses minimal pain around chest tube insertion site  ??  Kidney injury, likely acute on chronic: Unclear baseline (ranging from 1.12 early in this hospitalization and up to 1.90 since liver transplant). Cr 1.41 just prior to VIR procedure, downtrended to 1.12 on hospital day #1, now uptrending to 1.68 on 3/16. Likely prerenal in etiology given poor PO fluid intake.  - [ ]  F/u PM BMP after 1 L NS IVF bolus    S/p OLT 09/2016: Secondary to chronic Hepatitis C and HCC. Follows with Dr. Josetta Huddle as well as [facility] transplant hepatology and oncology.  Recently found to have new liver lesions on MRI 05/04/2017 thought to be HCC versus PTLD versus fluid filled abscess now s/p liver biopsy as above. Liver transplant team following.  - [ ]  Follow up IR guided biopsy results   -Continue home Cyclosporine 175 mg BID   -Home Cellcept 500 mg BID   ??  Recent Choledocholithiasis: ERCP on 04/26/17 and was found to have a CBD stricture and one CBD stone. Following this procedure, his symptoms were consistent with post-ERCP pancreatitis. Hepatic function appears stable   -Was tentatively scheduled for repeat ERCP with biopsies on 3/18 -likely defer  ??  S/p tracheostomy, incompletely healed: Patient with trach tube removed 11/2016 after hypoxic respiratory failure and failed extubation attempts in setting of liver transplant.  - May need to defer ENT procedures  ??  Diabetes mellitus: Complaining of recent hypoglycemia   -hemoglobin A1c 6.5  -Decrease  lantus 12->6 U nightly  -Holding lispro 6 units TIDAC  -SSI  ??  Paroxysmal atrial fibrillation: In NSR on presentation.  - Continue digoxin  - Daily PT/INR for monitoring   - [ ]  F/u with IP about when to restart Warfarin  ??  HFpEF s/p implanted pacemaker:   - Continue to monitor  ??  Pulmonary hypertension:   -Continue sildenafil   ??  Malnutrition s/p G-tube: Not currently using   - Nutrition consult appreciated   ??  GERD:  - Home omeprazole  ??  Diet:??Regular   DVT PPx:??Not indicated  Electrolytes:??PRN  Code Status: Full   Dispo:??Floor, patient 5 times low intensity per physical therapy. Patient requiring 5x weekly PT/OT (though may be able to do 3x/week with adequate pain control now that he is s/p chest tube removal).  Stating he does not wish to dispo to SNF. Will work with CM to set up home health.  ??  Code Status:  Full Code  ___________________________________________________________________    Subjective:  Reports feeling significantly improved. Now w/ 4/10 pain. Good appetite. No N/V/diarrhea. Had BM yesterday evening.    Recent Results (from the past 24 hour(s))   POCT Glucose    Collection Time: 05/26/17 12:46 PM   Result Value Ref Range    Glucose, POC 142 65 - 179 mg/dL   POCT Glucose    Collection Time: 05/26/17  5:19 PM   Result Value Ref Range    Glucose, POC 211 (H) 65 - 179 mg/dL   POCT Glucose    Collection Time: 05/26/17  8:56 PM   Result Value Ref Range    Glucose, POC 102 65 - 179 mg/dL Comprehensive metabolic panel    Collection Time: 05/27/17  6:01 AM   Result Value Ref Range    Sodium 135 135 - 145 mmol/L    Potassium 5.3 (H) 3.5 - 5.0 mmol/L    Chloride 101 98 - 107 mmol/L    CO2 27.0 22.0 - 30.0 mmol/L    BUN 39 (H) 7 - 21 mg/dL    Creatinine 2.95 (H) 0.70 - 1.30 mg/dL    BUN/Creatinine Ratio 23     EGFR MDRD Non Af Amer 41 (L) >=60 mL/min/1.56m2    EGFR MDRD Af Amer 49 (L) >=60 mL/min/1.91m2    Anion Gap 7 (L) 9 - 15 mmol/L    Glucose 148 65 - 179 mg/dL    Calcium 9.5 8.5 - 28.4 mg/dL    Albumin 2.7 (L) 3.5 - 5.0 g/dL    Total Protein 4.8 (L) 6.5 - 8.3 g/dL    Total Bilirubin 0.5 0.0 - 1.2 mg/dL    AST 10 (L) 19 - 55 U/L    ALT 14 (L) 19 - 72 U/L    Alkaline Phosphatase 101 38 - 126 U/L   Magnesium Level    Collection Time: 05/27/17  6:01 AM   Result Value Ref Range    Magnesium 1.7 1.6 - 2.2 mg/dL   Protime-INR    Collection Time: 05/27/17  6:01 AM   Result Value Ref Range    PT 12.3 10.2 - 12.8 sec    INR 1.08    CBC w/ Differential    Collection Time: 05/27/17  6:01 AM   Result Value Ref Range    WBC 4.4 (L) 4.5 - 11.0 10*9/L    RBC 2.90 (L) 4.50 - 5.90 10*12/L    HGB 8.3 (L) 13.5 - 17.5 g/dL    HCT 13.2 (L) 44.0 - 53.0 %    MCV 88.4 80.0 - 100.0 fL    MCH 28.6 26.0 - 34.0 pg    MCHC 32.3 31.0 - 37.0 g/dL    RDW 10.2 (H) 72.5 - 15.0 %    MPV 7.9 7.0 - 10.0 fL    Platelet 159 150 - 440 10*9/L    Neutrophils % 67.3 %    Lymphocytes % 23.6 %    Monocytes % 5.4 %    Eosinophils % 1.6 %    Basophils % 0.3 %    Absolute Neutrophils 3.0 2.0 - 7.5 10*9/L    Absolute Lymphocytes 1.0 (L) 1.5 - 5.0 10*9/L    Absolute Monocytes 0.2 0.2 - 0.8 10*9/L    Absolute Eosinophils 0.1 0.0 - 0.4 10*9/L    Absolute Basophils 0.0 0.0 -  0.1 10*9/L    Large Unstained Cells 2 0 - 4 %    Anisocytosis Slight (A) Not Present   Prepare RBC    Collection Time: 05/27/17  7:00 AM   Result Value Ref Range    Crossmatch Compatible     Unit Blood Type O Neg     ISBT Number 9500     Unit # [id number]     Status Transfused Product ID Red Blood Cells     PRODUCT CODE [id number]    POCT Glucose    Collection Time: 05/27/17  8:07 AM   Result Value Ref Range    Glucose, POC 156 65 - 179 mg/dL     Labs/Studies:  Labs and Studies from the last 24hrs per EMR and Reviewed    Objective:  Temp:  [35.6 ??C (96.1 ??F)-36.9 ??C (98.4 ??F)] 36.1 ??C (97 ??F)  Heart Rate:  [74-97] 82  Resp:  [16-18] 16  BP: (95-112)/(53-71) 104/59  SpO2:  [95 %-99 %] 95 %    GEN: Elderly, chronically ill-appearing male. In NAD, laying comfortably in bed.  ENT: MMM  CV: RRR, no M/R/G  PULM: No increased WOB. Lungs CTAB, poor respiratory effort  EXT: No edema.     Marvis Moeller, MS4    I attest that I have reviewed the student note and that the components of the history of the present illness, the physical exam, and the assessment and plan documented were performed by me or were performed in my presence by the student where I verified the documentation and performed (or re-performed) the exam and medical decision making.     Terrall Laity, MD, PhD  Internal Medicine PGY-2  Pager: [phone]

## 2017-05-27 NOTE — Unmapped (Signed)
Pigtail pulled today as patient with decrasing output and no evidence of ongoing bleed. Pt tolerated. IP will sign off.     Maxwell Caul, MD  10:00 PM, May 26, 2017  Interventional Pulmonary Fellow  Pager #: [phone]

## 2017-05-27 NOTE — Unmapped (Signed)
Problem: Patient Care Overview  Goal: Plan of Care Review  Outcome: Progressing  Pt is alert and oriented X4; VSS. C/o pain; medicated with PRN pain medications. BG has been monitored and appropriate insulin given. Chest tube flushed as ordered, and 30 ml output for the shift, and the MD from pulmonary consult team just pulled it out at 7:00pm. Pt tolerated well. Encouraged pt to use incentive spirometers. Bed side table and call light within pt's reach. Pt is free from fall/injury; will continue to monitor.  Goal: Individualization and Mutuality  Outcome: Progressing    Goal: Discharge Needs Assessment  Outcome: Progressing    Goal: Interprofessional Rounds/Family Conf  Outcome: Progressing      Problem: Fall Risk (Adult)  Goal: Identify Related Risk Factors and Signs and Symptoms  Related risk factors and signs and symptoms are identified upon initiation of Human Response Clinical Practice Guideline (CPG).   Outcome: Progressing    Goal: Absence of Fall  Patient will demonstrate the desired outcomes by discharge/transition of care.   Outcome: Progressing      Problem: Self-Care Deficit (Adult,Obstetrics,Pediatric)  Goal: Identify Related Risk Factors and Signs and Symptoms  Related risk factors and signs and symptoms are identified upon initiation of Human Response Clinical Practice Guideline (CPG).   Outcome: Progressing    Goal: Improved Ability to Perform BADL and IADL  Patient will demonstrate the desired outcomes by discharge/transition of care.   Outcome: Progressing      Problem: Diabetes, Type 2 (Adult)  Goal: Signs and Symptoms of Listed Potential Problems Will be Absent, Minimized or Managed (Diabetes, Type 2)  Signs and symptoms of listed potential problems will be absent, minimized or managed by discharge/transition of care (reference Diabetes, Type 2 (Adult) CPG).   Outcome: Progressing      Problem: Breathing Pattern Ineffective (Adult)  Goal: Identify Related Risk Factors and Signs and Symptoms Related risk factors and signs and symptoms are identified upon initiation of Human Response Clinical Practice Guideline (CPG).   Outcome: Progressing    Goal: Effective Oxygenation/Ventilation  Patient will demonstrate the desired outcomes by discharge/transition of care.   Outcome: Progressing    Goal: Anxiety/Fear Reduction  Patient will demonstrate the desired outcomes by discharge/transition of care.   Outcome: Progressing      Problem: VTE, DVT and PE (Adult)  Goal: Signs and Symptoms of Listed Potential Problems Will be Absent, Minimized or Managed (VTE, DVT and PE)  Signs and symptoms of listed potential problems will be absent, minimized or managed by discharge/transition of care (reference VTE, DVT and PE (Adult) CPG).   Outcome: Progressing

## 2017-05-27 NOTE — Unmapped (Signed)
OCCUPATIONAL THERAPY  Evaluation (05/26/17 1720)    Patient Name:  Jeffrey Ward       Medical Record Number: [id number]   Date of Birth: 04-13-1950  Sex: Male          OT Treatment Diagnosis:  ADL and functional mobility deficits s/p CT placement    Assessment  Jeffrey Ward is a 67 y.o. male with PMHx significant for HepC cirrhosis and HCC s/p liver transplant (09/2016), paroxysmal atrial fibrillation, IDDM, H/O tracheotomy, HFpEF,PAH, recent CBD stones s/p ERCP, and peripheral neuropathy who presents to [facility] emergency room after liver biopsy complicated by hemothorax now with chest tube by interventional pulmonology with improving pain.     Pt presents to acute OT demonstrating impaired ADL performance and functional mobility 2/2 pain at chest tube site. Pt buckling during standing attempt 2/2 back pain. Currently recommend high intensity post-acute OT 5x/week. May progress to 3x with improved pain management.     Based on the daily activity AM-PAC raw score of 16/24, the pt is considered to be 53.32% impaired with self care. After review of pt's occupational profile and history, assessment of occupational performance, clinical decision making, and development of POC, pt presents as a moderate complexity case.         Activity Tolerance During Today's Session  Patient tolerated treatment well;Patient limited by pain    Plan  Planned Frequency of Treatment:  1-2x per day for: 4-5x week       Planned Interventions:  Adaptive equipment;ADL retraining;Balance activities;Bed mobility;Compensatory tech. training;Conservation;Education - Patient;Education - Family / caregiver;Endurance activities;Functional cognition;Functional mobility;Home exercise program;Modalities;Neuromuscular re-education;Postular / Proximal stability;Positioning;Range of motion;Safety education;Therapeutic exercise;Transfer training;UE Strength / coordination exercise    Post-Discharge Occupational Therapy Recommendations:  OT Post Acute Discharge Recommendations: 5x weekly;High intensity (may progress to 3x with pain management)    ;    OT DME Recommendations: Defer to post acute    GOALS:   Patient and Family Goals: To get better    Long Term Goal #1: Pt will be MOD I + LRAD for ADL and functional mobility within 4 weeks       Short Term:  Pt will be MOD I + LRAD for toilet t/f and toileting   Time Frame : 2 weeks  Pt will be MOD I + LRAD for 2+ mins standing ADL   Time Frame : 2 weeks  Pt will be MOD I + LRAD for LBD   Time Frame : 2 weeks  Pt will be setup for UBD   Time Frame : 2 weeks  Pt will demonstrate independence with incentive spirometer use for functional endurance   Time Frame : 2 weeks    Prognosis:  Excellent  Positive Indicators:  PLOF, motivation, attitude  Barriers to Discharge: Pain;Inability to safely perform ADLS;Gait instability    Subjective  Current Status Pt rec'd and left up in bed, call bell in reach, RN aware  Prior Functional Status mod I + cane PTA. Used cane mostly for community mobility. mod I seated for bathing. Independent for other ADL. Not currently driving 2/2 post-transplant precautions. Whispers 2/2 trach scarring, vocal chord surgery planned for 2 weeks from now.    Medical Tests / Procedures: Reviewed chart  Services patient receives: OT;PT  Patient / Caregiver reports: I can feel it when the pain meds start to wear off.    Past Medical History:   Diagnosis Date   ??? Cancer (CMS-HCC)    ??? Cirrhosis (CMS-HCC)    ???  Diabetes (CMS-HCC)    ??? Hepatitis C 07/17/2012   ??? Liver disease    ??? Low back pain 07/17/2012   ??? Varices, esophageal (CMS-HCC)     Social History   Substance Use Topics   ??? Smoking status: Never Smoker   ??? Smokeless tobacco: Never Used      Comment: Smoked in high school for about 5 years.    ??? Alcohol use No      Past Surgical History:   Procedure Laterality Date   ??? ANKLE SURGERY     ??? BACK SURGERY     ??? BACK SURGERY     ??? CHG US GUIDE, TISSUE ABLATION N/A 01/22/2016    Procedure: ULTRASOUND GUIDANCE FOR, AND MONITORING OF, PARENCHYMAL TISSUE ABLATION;  Surgeon: Particia Nearing, MD;  Location: MAIN OR [facility];  Service: Transplant   ??? IR EMBOLIZATION ORGAN ISCHEMIA, TUMORS, INFAR  06/16/2016    IR EMBOLIZATION ORGAN ISCHEMIA, TUMORS, INFAR 06/16/2016 Ammie Dalton, MD IMG VIR [facility]   ??? PR COLON CA SCRN NOT HI RSK IND N/A 02/27/2015    Procedure: COLOREC CNCR SCR;COLNSCPY NO;  Surgeon: Vonda Antigua, MD;  Location: GI PROCEDURES MEMORIAL [facility];  Service: Gastroenterology   ??? PR ENDOSCOPIC ULTRASOUND EXAM N/A 02/27/2015    Procedure: UGI ENDO; W/ENDO ULTRASOUND EXAM INCLUDES ESOPHAGUS, STOMACH, &/OR DUODENUM/JEJUNUM;  Surgeon: Vonda Antigua, MD;  Location: GI PROCEDURES MEMORIAL [facility];  Service: Gastroenterology   ??? PR ERCP BALLOON DILATE BILIARY/PANC DUCT/AMPULLA EA N/A 02/10/2017    Procedure: ERCP;WITH TRANS-ENDOSCOPIC BALLOON DILATION OF BILIARY/PANCREATIC DUCT(S) OR OF AMPULLA, INCLUDING SPHINCTERECTOMY, WHEN PERFOREMD,EACH DUCT (16109);  Surgeon: Mayford Knife, MD;  Location: GI PROCEDURES MEMORIAL [facility];  Service: Gastroenterology   ??? PR ERCP REMOVE FOREIGN BODY/STENT BILIARY/PANC DUCT N/A 02/10/2017    Procedure: ENDOSCOPIC RETROGRADE CHOLANGIOPANCREATOGRAPHY (ERCP); W/ REMOVAL OF FOREIGN BODY/STENT FROM BILIARY/PANCREATIC DUCT(S);  Surgeon: Mayford Knife, MD;  Location: GI PROCEDURES MEMORIAL [facility];  Service: Gastroenterology   ??? PR ERCP STENT PLACEMENT BILIARY/PANCREATIC DUCT N/A 11/29/2016    Procedure: ENDOSCOPIC RETROGRADE CHOLANGIOPANCREATOGRAPHY (ERCP); WITH PLACEMENT OF ENDOSCOPIC STENT INTO BILIARY OR PANCREATIC DUCT;  Surgeon: Chriss Driver, MD;  Location: GI PROCEDURES MEMORIAL [facility];  Service: Gastroenterology   ??? PR ERCP STENT PLACEMENT BILIARY/PANCREATIC DUCT N/A 04/26/2017    Procedure: ENDOSCOPIC RETROGRADE CHOLANGIOPANCREATOGRAPHY (ERCP); WITH PLACEMENT OF ENDOSCOPIC STENT INTO BILIARY OR PANCREATIC DUCT;  Surgeon: Mayford Knife, MD;  Location: GI PROCEDURES MEMORIAL [facility];  Service: Gastroenterology   ??? PR ERCP,W/REMOVAL STONE,BIL/PANCR DUCTS N/A 11/29/2016    Procedure: ERCP; W/ENDOSCOPIC RETROGRADE REMOVAL OF CALCULUS/CALCULI FROM BILIARY &/OR PANCREATIC DUCTS;  Surgeon: Chriss Driver, MD;  Location: GI PROCEDURES MEMORIAL [facility];  Service: Gastroenterology   ??? PR ERCP,W/REMOVAL STONE,BIL/PANCR DUCTS N/A 02/10/2017    Procedure: ERCP; W/ENDOSCOPIC RETROGRADE REMOVAL OF CALCULUS/CALCULI FROM BILIARY &/OR PANCREATIC DUCTS;  Surgeon: Mayford Knife, MD;  Location: GI PROCEDURES [facility];  Service: Gastroenterology   ??? PR ERCP,W/REMOVAL STONE,BIL/PANCR DUCTS N/A 04/26/2017    Procedure: ERCP; W/ENDOSCOPIC RETROGRADE REMOVAL OF CALCULUS/CALCULI FROM BILIARY &/OR PANCREATIC DUCTS;  Surgeon: Mayford Knife, MD;  Location: GI PROCEDURES MEMORIAL [facility];  Service: Gastroenterology   ??? PR INSER HEART TEMP PACER ONE CHMBR N/A 10/02/2016    Procedure: Tempoarary Pacemaker Insertion;  Surgeon: Meredith Leeds, MD;  Location: [facility] EP;  Service: Cardiology   ??? PR LAP,DIAGNOSTIC ABDOMEN N/A 01/22/2016    Procedure: Laparoscopy, Abdomen, Peritoneum, & Omentum, Diagnostic, W/Wo Collection Specimen(S) By Brushing Or Washing;  Surgeon: Particia Nearing, MD;  Location: MAIN OR [facility];  Service: Transplant   ??? PR PLACE PERCUT GASTROSTOMY TUBE N/A 11/17/2016    Procedure: UGI ENDO; W/DIRECTED PLCMT PERQ GASTROSTOMY TUBE;  Surgeon: Cletis Athens, MD;  Location: GI PROCEDURES MEMORIAL [facility];  Service: Gastroenterology   ??? PR TRACHEOSTOMY, PLANNED N/A 09/29/2016    Procedure: TRACHEOSTOMY PLANNED (SEPART PROC);  Surgeon: Katherina Mires, MD;  Location: MAIN OR [facility];  Service: Trauma   ??? PR TRANSCATH INSERT OR REPLACE LEADLESS PM VENTR N/A 10/11/2016    Procedure: Pacemaker Implant/Replace Leadless;  Surgeon: Meredith Leeds, MD;  Location: [facility] EP;  Service: Cardiology   ??? PR TRANSPLANT LIVER,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    Procedure: Liver Allotransplantation; Orthotopic, Partial Or Whole, From Cadaver Or Living Donor, Any Age;  Surgeon: Doyce Loose, MD;  Location: MAIN OR [facility];  Service: Transplant   ??? PR TRANSPLANT,PREP DONOR LIVER, WHOLE N/A 09/15/2016    Procedure: Rogelia Boga Std Prep Cad Donor Whole Liver Gft Prior Tnsplnt,Inc Chole,Diss/Rem Surr Tissu Wo Triseg/Lobe Splt;  Surgeon: Doyce Loose, MD;  Location: MAIN OR [facility];  Service: Transplant   ??? PR UPPER GI ENDOSCOPY,BIOPSY N/A 07/17/2012    Procedure: UGI ENDOSCOPY; WITH BIOPSY, SINGLE OR MULTIPLE;  Surgeon: Alba Destine, MD;  Location: GI PROCEDURES MEMORIAL [facility];  Service: Gastroenterology   ??? PR UPPER GI ENDOSCOPY,DIAGNOSIS N/A 02/04/2014    Procedure: UGI ENDO, INCLUDE ESOPHAGUS, STOMACH, & DUODENUM &/OR JEJUNUM; DX W/WO COLLECTION SPECIMN, BY BRUSH OR WASH;  Surgeon: Wilburt Finlay, MD;  Location: GI PROCEDURES MEMORIAL [facility];  Service: Gastroenterology   ??? PR UPPER GI ENDOSCOPY,LIGAT VARIX N/A 11/05/2013    Procedure: UGI ENDO; W/BAND LIG ESOPH &/OR GASTRIC VARICES;  Surgeon: Wilburt Finlay, MD;  Location: GI PROCEDURES MEMORIAL [facility];  Service: Gastroenterology    Family History   Problem Relation Age of Onset   ??? Hypertension Mother    ??? Cirrhosis Neg Hx    ??? Liver cancer Neg Hx    ??? Anemia Neg Hx    ??? Cancer Neg Hx    ??? Diabetes Neg Hx    ??? Kidney disease Neg Hx    ??? Obesity Neg Hx    ??? Thyroid disease Neg Hx    ??? Osteoporosis Neg Hx    ??? Coronary artery disease Neg Hx    ??? Anesthesia problems Neg Hx         Patient has no known allergies.     Objective Findings  Precautions / Restrictions  Falls precautions;Other precautions (delirium prevention)    Weight Bearing  Non-applicable    Required Braces or Orthoses  Non-applicable    Communication Preference  Verbal;Visual (pt whispers 2/2 trach, vocal chord sx planned in 2 wks)    Pain  6/10 at start of session in back/R side, RN provided tylenol and pt requested oxy as well    Equipment / Environment  Vascular access (PIV, TLC, Port-a-cath, PICC);Chest tube(s) (R side)    Living Situation   Living environment: House   Lives With: Spouse;Other (grandson)   Home Living: One level home;Grab bars in shower;Walk-in shower;Garden tub;Built-in shower seat;Stairs to enter with rails;Handicapped height toilet   Equipment available at home: [employer] placement (outside): Rail on left side        Cognition   Orientation Level:      Arousal/Alertness:      Attention Span:      Memory:  Decreased recall  of recent events   Following Commands:      Safety Judgment:      Awareness of Errors:      Problem Solving:      Comments: Pt appeared intact though occasionally made comments that suggested confusion then quickly self-corrected. Pt did not recall working with PT this morning despite this therapist thorough describing session.    Vision / Perception  Vision: Wears glasses for reading only  Perception: Appears WNL       Hand Function  Hand Dominance: R  5/5 grip bilaterally    Skin Inspection  CT site c/d/i    ROM / Strength/Coordination  UE ROM/ Strength/ Coordination: LUE WFL. RUE AROM limited to ~160* flexion due to pain.  LE ROM/ Strength/ Coordination: WFL bilaterally. R knee buckled during standing attempt 2/2 back pain.    Sensation:  Tingling in B feet and L hand -- suspect 2/2 transplant meds    Balance:  Supervision sitting EOB. Tolerated 20 mins sitting EOB with slowly increasing discomfort for CT. MIN A + rollator standing ~5 seconds before R knee buckle 2/2 back pain. Poor standing tolerance, unable to achieve upright position.    Mobility/Gait/Transfers: SBA for sup<>sit t/f with HOB@30 *. MIN A + rollator for sit<>stand, unable to achieve full upright standing, R knee then buckled 2/2 back pain.    ADL:        Dressing: MOD A for UBD and LBD  Eating: Independent seated EOB  Toileting: MOD + rollator to [facility], assist for hygiene 2/2 RHD/R side pain       Vitals/ Orthostatics:  At Rest: NAD  With Activity: NAD       Interventions Performed During Today's Session: AMPAC. OT role, POC, incentive spirometer, pain management/staying ahead of pain to improve functional participation, bed mobility, t/f training, standing tolerance, sitting balance/tolerance         Eval Duration (OT): 40 Min.    Medical Staff Made Aware: RN Nelida Meuse    I attest that I have reviewed the above information.  Signed: Bernarda Caffey, OT  Filed 05/26/2017

## 2017-05-27 NOTE — Unmapped (Signed)
Physician Discharge Summary    Identifying Information:   Jeffrey Ward  [birth year]  [id number]    Admit date: 05/24/2017    Discharge date: 05/28/2017     Discharge Service: Med General Dor[facility] Counter (MDU)    Discharge Attending Physician: Janell Quiet Mock, MD    Discharge to: Home with Home Health and/or PT/OT    Discharge Diagnoses:  Principal Problem:    Hemothorax  Active Problems:    Anemia    End-stage liver disease (CMS-HCC)    HCC (hepatocellular carcinoma) (CMS-HCC)  Resolved Problems:    * No resolved hospital problems. 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    Hospital course listed by problem below:  ??  Hemothorax:  Presenting with acute pre-syncopal symptoms/RUQ pain and new right sided hemothorax on CT chest likely due to liver biopsy by VIR near hepatic dome. Chest tube placed and put to suction, with bloody output. No signs of infection on fluid analysis. Abdominal and liver U/S without signs of liver bleed. Daily chest imaging showed improvement in hemothorax. Hb 10.2 prior to VIR procedure, and dropped to 7.7 immediately after discovery of hemothorax. Patient received 1U PRBC after which his Hb stabilized around 8-9. Chest tube was removed on 3/15. At the time of discharge, pain was well-controlled on PO oxycodone. Discharged with a 5 day supply.    Kidney injury, likely chronic: Unclear baseline (ranging from 1.12 early in this hospitalization and as high as 1.90 since liver transplant). Cr 1.41 just prior to VIR procedure, downtrended to 1.12 on hospital day #1, now uptrending to 1.68 on 3/16. Stable around 1.50 at time of discharge.    S/p OLT (09/2016):   Secondary to chronic Hepatitis C and HCC. Follows with Dr. Josetta Huddle as well as [facility] transplant hepatology and oncology.  Recently found to have new liver lesions on MRI 05/04/2017 thought to be HCC versus PTLD versus fluid filled abscess now s/p liver biopsy as above. Patient was followed by transplant hepatology throughout hospitalization. He continued PTA immunosuppressive therapies (Cyclosporine and Cellcept). Creatinine remained at patient's baseline. VIR liver biopsy results pending.  ??  Recent Choledocholithiasis:  ERCP on 04/26/17 revealed CBD stricture and one CBD stone. Developed post-ERCP pancreatitis. Hepatic function remained stable and lipase only showed insignificatn mild elevation. Repeat ERCP originally scheduled for 3/18 with Dr. Josetta Huddle.  ??  S/p tracheostomy, incompletely healed:   Patient with trach tube removed 11/2016 after hypoxic respiratory failure and failed extubation attempts in setting of liver transplant. Scheduled for ENT procedure on 3/20.  ??  Insulin Dependent Diabetes mellitus:   Complaining of recent hypoglycemia. Hemoglobin A1c 6.5% (increased from 5.1% on 12/2016). Decreased lantus from 12 to 6 U nightly. Held home lispro (6U TID) and pt placed on SSI.  ??  Paroxysmal atrial fibrillation:   Remained in NSR throughout hospitalization. Continue home digoxin. Warfarin held in the setting of hemothorax. Monitored daily PT/INRs. Restarted Warfarin on 3/16 at home dose (3 mg QHS).  ??  HFpEF s/p implanted pacemaker:   Monitored.  ??  Pulmonary hypertension:   Continued sildenafil   ??  Malnutrition s/p G-tube:   Has not used for several months. Consulted nutrition to assess current status.  ??  GERD:  Continued home omeprazole.      Procedures:  Chest tube placed 3/14 and removed on 3/15 _____________________________________________________________________________  Discharge Day Services:  BP 106/56  - Pulse 87  - Temp 36.2 ??C (Oral)  - Resp 17  - Wt 63.1 kg (139 lb 1.8 oz)  - SpO2 100%  - BMI 21.15 kg/m??   Pt seen on the day of discharge and determined appropriate for discharge.    Condition at Discharge: fair    Length of Discharge: I spent greater than 30 mins in the discharge of this patient.  _____________________________________________________________________________  Discharge Medications:     Your Medication List      START taking these medications    oxyCODONE 5 MG immediate release tablet  Commonly known as:  ROXICODONE  Take 1 tablet (5 mg total) by mouth every four (4) hours as needed. for up to 5 days        CHANGE how you take these medications    cholecalciferol (vitamin D3) 2,000 unit Cap  1 capsule (2,000 Units total) by PEG Tube route daily.  What changed:  how to take this        CONTINUE taking these medications    acetaminophen 325 MG tablet  Commonly known as:  TYLENOL  Take 1 tablet (325 mg total) by mouth every four (4) hours as needed for pain.     blood sugar diagnostic Strp  Commonly known as:  FREESTYLE TEST  by Other route Four (4) times a day (before meals and nightly).     blood-glucose meter kit  Commonly known as:  glucose monitoring kit  Use as instructed     digoxin 125 mcg tablet  Commonly known as:  LANOXIN  0.5 tablets (62.5 mcg total) by G-tube route daily.     insulin ASPART 100 unit/mL injection pen  Commonly known as:  NovoLOG Flexpen U-100 Insulin  Inject 0.06 mL (6 Units total) under the skin Three (3) times a day before meals.     insulin ASPART 100 unit/mL injection pen  Commonly known as:  NovoLOG FLEXPEN  Additional mealtime per SSI-  151-200= 2 units,  201-250= 4 units, 251-300=add 6 units, 301-350=8 units, 351-400=10 units, >400 =12 units     insulin glargine 100 unit/mL (3 mL) injection pen  Commonly known as:  LANTUS  Inject 0.12 mL (12 Units total) under the skin nightly.     lancets Misc  1 each by Miscellaneous route Four (4) times a day (before meals and nightly).     magnesium (amino acid chelate) 133 mg Tab  Generic drug:  magnesium oxide-Mg AA chelate  TAKE 1 TABLET BY MOUTH TWICE DAILY     melatonin 3 mg Tab  Take 1 tablet (3 mg total) by mouth nightly.     mirtazapine 15 MG tablet  Commonly known as:  REMERON  Take 1 tablet (15 mg total) by mouth nightly.     mycophenolate 250 mg capsule  Commonly known as:  CELLCEPT  Take 2 capsules (500 mg total) by mouth Two (2) times a day.     NEORAL 100 MG capsule  Generic drug:  cycloSPORINE modified  175 mg BID (three 25 mg capsules and one 100 mg capsule two times  a day)     NEORAL 25 MG capsule  Generic drug:  cycloSPORINE modified  175 mg BID (three 25 mg capsules and one 100 mg capsule two times a day)     omeprazole 20 MG capsule  Commonly known as:  PriLOSEC  Take 1 capsule (20 mg total) by mouth daily.     pen needle, diabetic 32 gauge x 5/32 Ndle  Commonly known as:  ULTICARE PEN NEEDLE  1 each by Miscellaneous route Four (4) times a day (before meals and nightly).     sildenafil (antihypertensive) 20 mg tablet  Commonly known as:  REVATIO  Take 40 mg by mouth Three (3) times a day.     traMADol 50 mg tablet  Commonly known as:  ULTRAM  Take 1 tablet (50 mg total) by mouth every six (6) hours as needed for pain.     warfarin 3 MG tablet  Commonly known as:  COUMADIN  Take 3 mg by mouth nightly.          _____________________________________________________________________________  Pending Test Results (if blank, then none):   Order Current Status    Pleural Fluid Culture Preliminary result          Most Recent Labs:  Microbiology Results (last day)     ** No results found for the last 24 hours. **          Lab Results   Component Value Date    WBC 2.9 (L) 05/28/2017    HGB 8.2 (L) 05/28/2017    HCT 25.2 (L) 05/28/2017    PLT 90 (L) 05/28/2017       Lab Results   Component Value Date    NA 135 05/28/2017    K 5.3 (H) 05/28/2017    CL 105 05/28/2017    CO2 24.0 05/28/2017    BUN 42 (H) 05/28/2017    CREATININE 1.52 (H) 05/28/2017    CALCIUM 9.4 05/28/2017    MG 1.5 (L) 05/28/2017    PHOS 3.5 05/25/2017       Lab Results   Component Value Date    ALKPHOS 113 05/28/2017    BILITOT 0.8 05/28/2017    BILIDIR <0.10 05/24/2017    PROT 4.6 (L) 05/28/2017    ALBUMIN 2.6 (L) 05/28/2017    ALT 23 05/28/2017    AST 15 (L) 05/28/2017    GGT 91 05/24/2017       Lab Results   Component Value Date    PT 13.3 (H) 05/28/2017    INR 1.17 05/28/2017    APTT 35.2 02/28/2017     Hospital Radiology:  Xr Chest Portable    Result Date: 05/27/2017  EXAM: CHEST ONE VIEW DATE: 05/27/2017 12:19 PM ACCESSION: [id number] UN DICTATED: 05/27/2017 3:20 PM [facility]PRETATION LOCATION: [facility] CLINICAL INDICATION: 67 years old Male with OTHER- s/p ct-  COMPARISON: 05/26/2017 TECHNIQUE:  Portable  semiupright AP view of the chest. CONCLUSIONS: Interval removal of basilar right pigtail pleural catheter. No definite pneumothorax is identified. Scattered linear opacities bilaterally compatible with atelectasis. Stable heart and mediastinal contours.     Xr Chest Portable    Result Date: 05/26/2017  EXAM: XR CHEST PORTABLE DATE: 05/26/2017 1:04 PM ACCESSION: [id number] UN DICTATED: 05/26/2017 1:30 PM INTERPRETATION LOCATION: [facility] CLINICAL INDICATION: 67 years old Male with - chest tube-  COMPARISON: Earlier same day chest radiograph and multiple other priors TECHNIQUE: Portable Chest Radiograph.     Minimal interval change in positioning of the right-sided chest tube which remains  along the right medial lung base. Remaining support devices are unchanged. Stable right lower lobe atelectasis. No other same-day changes.    Xr Chest Portable    Result Date: 05/26/2017  EXAM: XR CHEST PORTABLE DATE: 05/26/2017 8:57 AM ACCESSION: [id number] UN DICTATED: 05/26/2017 9:33 AM INTERPRETATION LOCATION: [facility] CLINICAL INDICATION: 67 years old Male with POSTSURGICAL STATUS- s/p hemothroax evacuation -  COMPARISON: 05/25/2017. TECHNIQUE: Portable Chest Radiograph. FINDINGS: Pigtail catheter projects at the right base. Subsegmental lingular atelectasis. No demonstrable pleural effusion or pneumothorax Increased density projecting over the right heart border suggestive of mediastinal/paracardiac hematoma as on 05/24/2017 CT chest. Cardiomediastinal silhouette is similar compared to prior chest radiograph. Orthopedic hardware projects over the spine.     No demonstrable pleural effusion or pneumothorax. There remains suggestion of right paracardiac/mediastinal hematoma.     Xr Chest Portable    Result Date: 05/25/2017  EXAM: XR CHEST PORTABLE DATE: 05/25/2017 5:57 AM ACCESSION: [id number] UN DICTATED: 05/25/2017 6:00 AM INTERPRETATION LOCATION: [facility] CLINICAL INDICATION: 67 years old Male with OTHER- Chest tube-  COMPARISON: 05/24/2017 TECHNIQUE: Portable Chest Radiograph. FINDINGS: Unchanged appearance of the pigtail chest tube at the right lung base. No pneumothorax or discernible pleural effusion. Bibasilar subsegmental atelectasis. Cardiomediastinal silhouette unchanged.     - Unchanged appearance of the pigtail chest tube at the right lung base without pneumothorax or discernible pleural effusion.    Xr Chest Portable    Result Date: 05/25/2017  EXAM: XR CHEST PORTABLE DATE: 05/24/2017 9:04 PM ACCESSION: [id number] UN DICTATED: 05/24/2017 9:03 PM [facility]PRETATION LOCATION: [facility] CLINICAL INDICATION: 67 years old Male with OTHER- chest tube-  COMPARISON: 05/24/2017 at 1:37 PM TECHNIQUE: Portable Chest Radiograph. CONCLUSION: Placement of a pigtail chest tube at the right lung base. No pneumothorax or pleural effusion. Unchanged same-day appearance of the lungs with bibasilar opacities, right greater than left. ATTENDING ADDENDUM BY DR. Margit Banda ON 05/25/2017 AT 7:53 AM: Trace right apical pneumothorax suspected.    Xr Chest 1 View    Result Date: 05/24/2017  EXAM: XR CHEST 1 VIEW DATE: 05/24/2017 1:44 PM ACCESSION: [id number] UN DICTATED: 05/24/2017 2:05 PM INTERPRETATION LOCATION: [facility] CLINICAL INDICATION: 67 years old Male with PNEUMOTHORAX-  COMPARISON: Chest radiograph from 02/28/2017 and prior. TECHNIQUE: Portable AP Chest Radiograph. FINDINGS: Unchanged positioning of the leadless  pacing device. Mildly hypoinflated lungs. Subtle right greater than left lower and medial opacities, likely related to atelectasis. No pleural effusion or pneumothorax Stable cardiomediastinal silhouette. Partially imaged thoracolumbar hardware. Focal lytic lesions in right proximal humerus      Bibasilar opacities, more prominent on the right, likely related to atelectasis and hypoinflation although developing infection cannot excluded. No pneumothorax identified. Focal lytic lesions in right proximal humerus. Recommend dedicated shoulder radiographs for further assessment.     Ct Chest Abdomen Wo Contrast    Result Date: 05/25/2017  EXAM: CT Chest & Abdomen without contrast DATE: 05/24/2017 5:10 PM ACCESSION: [id number] UN DICTATED: 05/24/2017 5:50 PM INTERPRETATION LOCATION: [facility] CLINICAL INDICATION: 67 years old Male with OTHER- bleeding s/p liver biopsy-  COMPARISON: Chest radiograph from same day, MRI abdomen 05/04/2017, same day IR biopsy images TECHNIQUE: A spiral CT scan was obtained without IV contrast from the thoracic inlet through the iliac crests. Images were reconstructed in the axial plane.  Coronal and sagittal reformatted images were also provided for further evaluation. FINDINGS: AIRWAYS, LUNGS, PLEURA: Clear central tracheobronchial tree.  Right hemothorax with compressive lower lobe atelectasis. Is high density material adjacent to the right heart border, possibly in the lower mediastinum  or loculated in the pleural space. Not believed to be pericardial. Mild left lower lobe atelectasis. No pneumothorax. MEDIASTINUM: Normal heart size.  No pericardial effusion. Coronary and aortic arch calcifications. Normal caliber thoracic aorta.  No mediastinal lymphadenopathy.  Subcentimeter right thyroid lobe nodule. ABDOMEN: HEPATOBILIARY: Hypoattenuating masses in the liver, better evaluated on MRI abdomen from 05/04/2017. Biliary stent noted. Left-sided large and small right pneumobilia. PANCREAS: Fatty replacement pancreas. SPLEEN: Unremarkable. ADRENAL GLANDS: Unremarkable. KIDNEYS/URETERS: Punctate bilateral calculi. BOWEL/PERITONEUM/RETROPERITONEUM: Gastrostomy tube in the stomach. No bowel obstruction. No pneumoperitoneum.. No ascites. VASCULATURE: Scattered aortic calcifications. Unremarkable inferior vena cava. LYMPH NODES: No adenopathy. BONES/SOFT TISSUES: Lower thoracic and lumbar hardware noted.     - Right-sided hemothorax. There is hyperdense material adjacent to the right heart border, this may represent a loculated portion of right hemothorax versus blood products in the lower mediastinum. This high density material is not believed to be in the pericardial space. - Large left pneumobilia and small right pneumobilia. - No evidence of pneumoperitoneum. The findings of this study were discussed via telephone with Dr. Lissa Hoard by Dr. Riley Lam Onuscheck at 05/24/2017 6:21 PM. ATTENDING ADDENDUM BY DR. Margit Banda ON 05/25/2017 AT 7:38 AM: 10.5 x 2.4 cm hyperdense collection along the right cardiac border likely represents hemothorax and mediastinal hematoma.    Cta Chest/abd (aortic Dissection)    Result Date: 05/25/2017  EXAM: CTA for Aortic Dissection DATE: 05/24/2017 10:06 PM ACCESSION: [id number] UN DICTATED: 05/24/2017 11:05 PM INTERPRETATION LOCATION: [facility] CLINICAL INDICATION: 67 years old Male with hemothorax-  COMPARISON: CT chest and abdomen earlier same day. MRI abdomen 05/04/2016. TECHNIQUE: A spiral CT scan was obtained without IV contrast from the thoracic inlet through the hemidiaphragms. Images were reconstructed in the axial plane. Next, a spiral CTA scan was obtained with IV contrast from the thoracic inlet through the aortic bifurcation. Images were reconstructed in the axial plane.  Multiplanar reformatted and MIP images are provided for further evaluation of the aorta. VASCULAR FINDINGS: Heart size is normal. Leadless pacer device within the right ventricle. Similar appearance of high density fluid along the right mediastinal border (5:65) which was not present on recent MRI abdomen on 05/04/2017 and is favored to represent loculated blood products in the pleural space. Conventional three vessel aortic arch anatomy. Proximal great vessels are patent. Normal caliber ascending thoracic aorta. Minimal scattered atheromatous calcifications throughout the aorta. Abdominal aorta is normal in caliber. No thoracic intramural hematoma or aortic dissection. Celiac, SMA, and IMA are patent. Single bilateral renal arteries are patent. Right common iliac, external iliac, and internal iliac arteries are patent. Mild calcified and noncalcified disease is seen in the right common and internal iliac arteries. The common femoral and visualized right superficial femoral and profunda arteries are patent. Right left iliac, external iliac, and internal iliac arteries are patent. Mild calcified and noncalcified disease is seen in the left common and internal iliac arteries. The common femoral and visualized left superficial femoral and profunda arteries are patent. NONVASCULAR FINDINGS: CHEST: LUNGS/AIRWAYS/PLEURA: No endobronchial lesions. Bibasilar atelectasis, right greater than left. No pulmonary nodules. Interval placement of right basilar pigtail catheter with decrease in right hemothorax. Trace right apical pneumothorax. MEDIASTINUM/HILA:  Subcentimeter hypoenhancing right thyroid nodules. LYMPH NODES: No adenopathy. ABDOMEN/PELVIS: HEPATOBILIARY: Hypoattenuating masses in the liver, better evaluated on MRI abdomen from 05/04/2017. Linear enhancement in hepatic segment VIII adjacent to the dominant hypoenhancing lesion is favored to represent the biopsy tract (5:89). Unchanged position of common bile duct stent. Unchanged pneumobilia, left greater than right.  PANCREAS: Fatty replaced. SPLEEN: Unremarkable. ADRENAL GLANDS: Unremarkable. KIDNEYS/URETERS: Symmetric nephrograms. No enhancing renal mass. Bilateral punctate obstructing renal calculi. No hydronephrosis. BLADDER: Unremarkable. BOWEL/PERITONEUM/RETROPERITONEUM: Gastrostomy tube in the stomach. Moderate colonic stool burden. No bowel obstruction. No acute inflammatory process. No pneumoperitoneum or ascites. LYMPH NODES: No adenopathy. REPRODUCTIVE ORGANS: Unremarkable. BONES/SOFT TISSUES: Bilateral gynecomastia. Diffuse osteopenia. Degenerative changes in the spine. Mild compression deformity of the T12 vertebral body with posterior spinal fixation from T10 to L2. Posterior spinal fixation from L5 to S1 with grade 1 anterolisthesis of L5 on S1.     No evidence of active arterial extravasation. Interval placement of right basilar pigtail catheter with decrease in right hemothorax. Trace right apical pneumothorax. Unchanged loculated component of the hemothorax tracking along the right mediastinal border. Multiple hypoattenuating lesions throughout the liver with sequelae of recent biopsy of the right hepatic dome lesion. Unchanged pneumobilia, left greater than right, with common bile duct stent in place.     Ir Biopsy Liver Percutaneous    Result Date: 05/26/2017  EXAM: US-GUIDED PERCUTANEOUS CORE BIOPSY DATE: 05/24/2017 1:01 PM ACCESSION: [id number] UN DICTATED: 05/26/2017 4:46 PM [facility]PRETATION LOCATION: [facility] CLINICAL INDICATION: 67 years old Male: 3 lesions on MRI need US guided biopsyR93.5-Abnormal MRI of abdomen  CONSENT: Informed consent was obtained from the patient including a discussion of the alternatives, benefits, and risks including but not limited to infection, bleeding, need for additional procedure, and/or ICU admission.  PROCEDURE: The patient was placed in the supine position. Initial ultrasound images of the abdomen were obtained, demonstrating hypoechoic left hepatic lobe and right hepatic dome lesions.  Appropriate entry sites were identified and marked on the skin. The upper abdomen was prepped and draped using all elements of maximal sterile barrier technique. Lidocaine with epinephrine was injected for local anesthesia.  Under ultrasound guidance, a 16-G needle was inserted into left hepatic lobe lesion.  Through this needle, 3 core biopsies were obtained using a spring-loaded 18-G Quick Core biopsy device.  The needle was removed and hemostasis was achieved with Gelfoam pledgets. Final [place] images were obtained, demonstrating no evidence of hematoma. The puncture site was covered with sterile dressing. Under ultrasound guidance, a 16-G needle was then inserted into right hepatic dome lesion.  Through this needle, 3 core biopsies were obtained using a spring-loaded 18-G Quick Core biopsy device.  The needle was removed and hemostasis was achieved with Gelfoam pledgets. Final [place] images were obtained, demonstrating no evidence of hematoma. The puncture site was covered with sterile dressing.  SEDATION: I personally spent 23 minutes, continuously monitoring the patient face-to-face during the administration of moderate sedation. Radiology nurse was present for the duration of the procedure to assist in patient monitoring.  Pre and Post Sedation activities have been reviewed.     Successful ultrasound-guided biopsy of left hepatic lobe and right hepatic dome lesions.      _____________________________________________________________________________  Discharge Instructions:   Activity Instructions     Activity as tolerated         You were hospitalized for right side hemothorax (bleeding into space around lung) after interventional radiology-assisted biopsy of liver lesion. Your chest tube has now been removed and your hemoglobin and chest x-rays have indicated that the bleeding has resolved.  - Please restart your home Warfarin  - Follow-up with liver transplant specialists regarding biopsy results  - We have arranged for physical therapy and occupational therapy to come out to your house to help you regain your strength  Appointments which have been scheduled for you    May 29, 2017  ERCP; DIAGNOSTIC, WITH OR WITHOUT COLLECTION OF SPECIMEN(S) BY BRUSHING OR WASHING with Mayford Knife, MD  GI PERIOP [facility] REGION) [address]  [place] [address]  [phone]   May 31, 2017  1:00 PM EDT  (Arrive by 12:45 PM)  INJECTION with Vista Deck, MD  [facility] MPR HEAD NECK [facility] REGION) [address]  [place] [address]  [phone]   Jun 08, 2017  9:00 AM EDT  (Arrive by 8:30 AM)  LAB ONLY with SURTRA LAB ONLY  [facility] TRANSPLANT SURGERY [place] [facility] REGION) [address]  [place] [address]  [phone]   Jun 08, 2017  9:30 AM EDT  (Arrive by 9:00 AM)  RETURN NUTRITION with Norm Salt, RD/LDN  [facility] NUTRITION SERVICES TRANSPLANT [place] [facility] REGION) [address]  [place] [address]  [phone]   Jun 08, 2017 11:20 AM EDT  (Arrive by 10:50 AM)  RETURN PHARMD with Ruth-Ann Maryln Manuel, CPP  [facility] TRANSPLANT SURGERY [facility] REGION) [address]  [place] [address]  [phone]   Jun 08, 2017  2:00 PM EDT  (Arrive by 1:30 PM)  RETURN 15 with Doyce Loose, MD  [facility] TRANSPLANT SURGERY [facility]) [address]  [place] [address]  [phone]   Jun 27, 2017  8:30 AM EDT  (Arrive by 8:15 AM)  NEW MOHS with Coralie Carpen, MD  [facility] MOHS SURGERY [facility] REGION) [address]  [place] [address]  [phone]   Jul 13, 2017  9:30 AM EDT  (Arrive by 9:00 AM)  LAB ONLY with SURTRA LAB ONLY  [facility] TRANSPLANT SURGERY [facility] REGION) [address]  [place] [address]  [phone]   Jul 13, 2017 10:00 AM EDT  (Arrive by 9:30 AM)  MRI ABDOMEN W WO CONTRA    -UN with [facility] MRI RM 1  IMG MRI [facility]) [address]  [place] [address]  [phone]   On appt date: Bring recent lab work Bring documentation of any metal object implants Take meds as usual Check w/physician if diabetic You will be asked to change into a gown for your safety  On appt date do not: Consume anything 2 hrs Wear metallic items including jewelry (we are not responsible for lost items)  Let us know if pt: Claustrophobic Metal object implant Pregnant Prescribed a sedative On dialysis Allergic to MRI dye/contrast Kidney Failure  (Title:[id number])   Jul 13, 2017 11:20 AM EDT  (Arrive by 10:50 AM)  RETURN PHARMD with Ruth-Ann Maryln Manuel, CPP  [facility] TRANSPLANT SURGERY  [facility]) [address]  [place] [address]  [phone]   Jul 13, 2017 12:30 PM EDT  (Arrive by 12:00 PM)  RETURN 15 with Doyce Loose, MD  [facility] TRANSPLANT SURGERY  [facility]) [address]  [place] [address]  [phone]   Jul 21, 2017  3:30 PM EDT  (Arrive by 3:15 PM)  RETURN  PULM HYPERTENSION with Jettie Booze, MD  [facility] PULMONARY SPECIALTY CL [place]  [facility] REG[facility]) [address]  [address]  [place] [address]  [phone]  Jul 24, 2017  1:00 PM EDT  (Arrive by 12:30 PM)  AT HOME PACEMAKER CHECK with [facility] EP REMOTE MONITORING  [facility] EP REMOTE MONITORING [facility] REGION) [address]  [address] [place] [address]  [phone]

## 2017-05-28 DIAGNOSIS — K8050 Calculus of bile duct without cholangitis or cholecystitis without obstruction: Secondary | ICD-10-CM

## 2017-05-28 DIAGNOSIS — Z944 Liver transplant status: Secondary | ICD-10-CM

## 2017-05-28 DIAGNOSIS — I272 Pulmonary hypertension, unspecified: Secondary | ICD-10-CM

## 2017-05-28 DIAGNOSIS — G4700 Insomnia, unspecified: Secondary | ICD-10-CM

## 2017-05-28 DIAGNOSIS — J9589 Other postprocedural complications and disorders of respiratory system, not elsewhere classified: Secondary | ICD-10-CM

## 2017-05-28 DIAGNOSIS — Z794 Long term (current) use of insulin: Secondary | ICD-10-CM

## 2017-05-28 DIAGNOSIS — K219 Gastro-esophageal reflux disease without esophagitis: Secondary | ICD-10-CM

## 2017-05-28 DIAGNOSIS — D696 Thrombocytopenia, unspecified: Secondary | ICD-10-CM

## 2017-05-28 DIAGNOSIS — K7290 Hepatic failure, unspecified without coma: Secondary | ICD-10-CM

## 2017-05-28 DIAGNOSIS — I480 Paroxysmal atrial fibrillation: Secondary | ICD-10-CM

## 2017-05-28 DIAGNOSIS — I8510 Secondary esophageal varices without bleeding: Secondary | ICD-10-CM

## 2017-05-28 DIAGNOSIS — J942 Hemothorax: Secondary | ICD-10-CM

## 2017-05-28 DIAGNOSIS — I5030 Unspecified diastolic (congestive) heart failure: Secondary | ICD-10-CM

## 2017-05-28 DIAGNOSIS — E46 Unspecified protein-calorie malnutrition: Secondary | ICD-10-CM

## 2017-05-28 DIAGNOSIS — K7460 Unspecified cirrhosis of liver: Secondary | ICD-10-CM

## 2017-05-28 DIAGNOSIS — Z7901 Long term (current) use of anticoagulants: Secondary | ICD-10-CM

## 2017-05-28 DIAGNOSIS — Z930 Tracheostomy status: Secondary | ICD-10-CM

## 2017-05-28 DIAGNOSIS — M961 Postlaminectomy syndrome, not elsewhere classified: Secondary | ICD-10-CM

## 2017-05-28 DIAGNOSIS — G894 Chronic pain syndrome: Secondary | ICD-10-CM

## 2017-05-28 DIAGNOSIS — Z931 Gastrostomy status: Secondary | ICD-10-CM

## 2017-05-28 DIAGNOSIS — C220 Liver cell carcinoma: Principal | ICD-10-CM

## 2017-05-28 DIAGNOSIS — E559 Vitamin D deficiency, unspecified: Secondary | ICD-10-CM

## 2017-05-28 DIAGNOSIS — E1142 Type 2 diabetes mellitus with diabetic polyneuropathy: Secondary | ICD-10-CM

## 2017-05-28 DIAGNOSIS — Z950 Presence of cardiac pacemaker: Secondary | ICD-10-CM

## 2017-05-28 DIAGNOSIS — N400 Enlarged prostate without lower urinary tract symptoms: Secondary | ICD-10-CM

## 2017-05-28 DIAGNOSIS — D630 Anemia in neoplastic disease: Secondary | ICD-10-CM

## 2017-05-28 DIAGNOSIS — B182 Chronic viral hepatitis C: Secondary | ICD-10-CM

## 2017-05-28 LAB — CBC W/ AUTO DIFF
BASOPHILS ABSOLUTE COUNT: 0 10*9/L (ref 0.0–0.1)
BASOPHILS RELATIVE PERCENT: 0.4 %
EOSINOPHILS ABSOLUTE COUNT: 0 10*9/L (ref 0.0–0.4)
EOSINOPHILS RELATIVE PERCENT: 0.6 %
HEMATOCRIT: 25.2 % — ABNORMAL LOW (ref 41.0–53.0)
HEMOGLOBIN: 8.2 g/dL — ABNORMAL LOW (ref 13.5–17.5)
LARGE UNSTAINED CELLS: 1 % (ref 0–4)
LYMPHOCYTES ABSOLUTE COUNT: 0.3 10*9/L — ABNORMAL LOW (ref 1.5–5.0)
LYMPHOCYTES RELATIVE PERCENT: 9.2 %
MEAN CORPUSCULAR HEMOGLOBIN CONC: 32.5 g/dL (ref 31.0–37.0)
MEAN CORPUSCULAR HEMOGLOBIN: 28.6 pg (ref 26.0–34.0)
MEAN CORPUSCULAR VOLUME: 87.9 fL (ref 80.0–100.0)
MONOCYTES ABSOLUTE COUNT: 0.2 10*9/L (ref 0.2–0.8)
NEUTROPHILS RELATIVE PERCENT: 81.6 %
PLATELET COUNT: 90 10*9/L — ABNORMAL LOW (ref 150–440)
RED BLOOD CELL COUNT: 2.87 10*12/L — ABNORMAL LOW (ref 4.50–5.90)
RED CELL DISTRIBUTION WIDTH: 15.6 % — ABNORMAL HIGH (ref 12.0–15.0)
WBC ADJUSTED: 2.9 10*9/L — ABNORMAL LOW (ref 4.5–11.0)

## 2017-05-28 LAB — COMPREHENSIVE METABOLIC PANEL
ALBUMIN: 2.6 g/dL — ABNORMAL LOW (ref 3.5–5.0)
ALT (SGPT): 23 U/L (ref 19–72)
ANION GAP: 6 mmol/L — ABNORMAL LOW (ref 9–15)
AST (SGOT): 15 U/L — ABNORMAL LOW (ref 19–55)
BILIRUBIN TOTAL: 0.8 mg/dL (ref 0.0–1.2)
BLOOD UREA NITROGEN: 42 mg/dL — ABNORMAL HIGH (ref 7–21)
CALCIUM: 9.4 mg/dL (ref 8.5–10.2)
CHLORIDE: 105 mmol/L (ref 98–107)
CO2: 24 mmol/L (ref 22.0–30.0)
CREATININE: 1.52 mg/dL — ABNORMAL HIGH (ref 0.70–1.30)
EGFR MDRD AF AMER: 55 mL/min/{1.73_m2} — ABNORMAL LOW (ref >=60)
EGFR MDRD NON AF AMER: 46 mL/min/{1.73_m2} — ABNORMAL LOW (ref >=60)
GLUCOSE RANDOM: 191 mg/dL — ABNORMAL HIGH (ref 65–179)
POTASSIUM: 5.3 mmol/L — ABNORMAL HIGH (ref 3.5–5.0)
PROTEIN TOTAL: 4.6 g/dL — ABNORMAL LOW (ref 6.5–8.3)
SODIUM: 135 mmol/L (ref 135–145)

## 2017-05-28 LAB — HYPOCHROMIA

## 2017-05-28 LAB — CO2: Carbon dioxide:SCnc:Pt:Ser/Plas:Qn:: 24

## 2017-05-28 LAB — PROTIME-INR: INR: 1.17

## 2017-05-28 LAB — CYCLOSPORINE (FPIA) BLOOD: Lab: 193

## 2017-05-28 LAB — CYCLOSPORINE LEVEL: CYCLOSPORINE (FPIA) BLOOD: 193 ng/mL

## 2017-05-28 LAB — INR: Lab: 1.17

## 2017-05-28 LAB — MAGNESIUM: Magnesium:MCnc:Pt:Ser/Plas:Qn:: 1.5 — ABNORMAL LOW

## 2017-05-28 MED ORDER — OXYCODONE 5 MG TABLET
ORAL_TABLET | ORAL | 0 refills | 0.00000 days | Status: CP | PRN
Start: 2017-05-28 — End: 2017-06-22

## 2017-05-28 NOTE — Unmapped (Signed)
Warfarin Pharmacy Note    Jeffrey Ward is a 67 y.o. male being treated with warfarin for atrial fibrillation. Patient's home warfarin was resumed after interventional pulmonology found no evidence of an ongoing bleed.    Anticoagulation History:    Target INR Range: 2-3  Prior to admission warfarin dose: 3 mg daily    Lab Results   Component Value Date    INR 1.08 05/27/2017    INR 1.07 05/26/2017    INR 1.14 05/24/2017       Current Medications:  Concurrent antiplatelet medications: n/a  Potential drug, herb or dietary supplement interactions: n/a      Warfarin Assessment/Plan:  1. INR is subtherapeutic.  2. Recommend to resume home warfarin dose of 3 mg nightly.  3. Warfarin education will be completed by pharmacist prior to patient discharge.  4. A pharmacist will continue to monitor the INR daily and adjust the warfarin dose in conjunction with the medical team.    Barbette Hair PharmD  PGY1 Resident  [phone]

## 2017-05-28 NOTE — Unmapped (Signed)
Problem: Patient Care Overview  Goal: Plan of Care Review  Outcome: Not Progressing    Goal: Discharge Needs Assessment  Outcome: Progressing      Problem: Fall Risk (Adult)  Goal: Identify Related Risk Factors and Signs and Symptoms  Related risk factors and signs and symptoms are identified upon initiation of Human Response Clinical Practice Guideline (CPG).   Outcome: Progressing    Goal: Absence of Fall  Patient will demonstrate the desired outcomes by discharge/transition of care.   Outcome: Progressing      Problem: Self-Care Deficit (Adult,Obstetrics,Pediatric)  Goal: Identify Related Risk Factors and Signs and Symptoms  Related risk factors and signs and symptoms are identified upon initiation of Human Response Clinical Practice Guideline (CPG).   Outcome: Progressing    Goal: Improved Ability to Perform BADL and IADL  Patient will demonstrate the desired outcomes by discharge/transition of care.   Outcome: Progressing      Problem: VTE, DVT and PE (Adult)  Goal: Signs and Symptoms of Listed Potential Problems Will be Absent, Minimized or Managed (VTE, DVT and PE)  Signs and symptoms of listed potential problems will be absent, minimized or managed by discharge/transition of care (reference VTE, DVT and PE (Adult) CPG).   Outcome: Progressing      Comments: Pt sleeping at this time, pt was not moving like yesterday, noted pt having harder time eating supper and drinking fluid.  No falls this shift, Pt still having a hard time standing, walking and most ADL's  without substantial help.  Pt feels unsafe to go home due to the weakness and the blood pressure being low, pt does not want to have a syncopal episode again.  Call bell within reach, bed at lowest level.  Will continue monitor

## 2017-05-28 NOTE — Unmapped (Signed)
Problem: Patient Care Overview  Goal: Plan of Care Review  Outcome: Progressing  Pt is alert and oriented X4; VSS. Chest tube site pain is a issue today, PRN pain med s given as needed with effectiveness, BP soft but stable. Family at bedside requested a update from MD. case manager involved [facility]. Bed side table and call light within pt's reach. Pt is free from fall/injury; will continue to monitor.    Problem: Self-Care Deficit (Adult,Obstetrics,Pediatric)  Goal: Improved Ability to Perform BADL and IADL  Patient will demonstrate the desired outcomes by discharge/transition of care.   Outcome: Progressing      Problem: VTE, DVT and PE (Adult)  Goal: Signs and Symptoms of Listed Potential Problems Will be Absent, Minimized or Managed (VTE, DVT and PE)  Signs and symptoms of listed potential problems will be absent, minimized or managed by discharge/transition of care (reference VTE, DVT and PE (Adult) CPG).   Outcome: Progressing

## 2017-05-28 NOTE — Unmapped (Signed)
Home Health Referral:    [facility]: [phone].   Referral accepted for start of care for PT /Ot on 05/29/17.

## 2017-05-28 NOTE — Unmapped (Signed)
Problem: Patient Care Overview  Goal: Plan of Care Review  Outcome: Progressing      Problem: Fall Risk (Adult)  Goal: Absence of Fall  Patient will demonstrate the desired outcomes by discharge/transition of care.   Outcome: Progressing      Problem: Diabetes, Type 2 (Adult)  Goal: Signs and Symptoms of Listed Potential Problems Will be Absent, Minimized or Managed (Diabetes, Type 2)  Signs and symptoms of listed potential problems will be absent, minimized or managed by discharge/transition of care (reference Diabetes, Type 2 (Adult) CPG).   Outcome: Progressing      Problem: Breathing Pattern Ineffective (Adult)  Goal: Identify Related Risk Factors and Signs and Symptoms  Related risk factors and signs and symptoms are identified upon initiation of Human Response Clinical Practice Guideline (CPG).   Outcome: Progressing    Pt's wife states they has 10 stairs to get in to her house, MD suggest PT evaluation, pt had a sponge bath, get up to chair with assist. VSS. Will monitor.

## 2017-05-29 NOTE — Unmapped (Signed)
Pt discharge instruction provided, all questions answered, he received his paper prescription, pt is waiting for wheel chair to go.

## 2017-05-30 ENCOUNTER — Inpatient Hospital Stay: Admit: 2017-05-30 | Discharge: 2017-06-23 | Payer: MEDICARE

## 2017-05-30 ENCOUNTER — Encounter: Admit: 2017-05-30 | Discharge: 2017-06-23 | Payer: MEDICARE

## 2017-05-30 DIAGNOSIS — I8510 Secondary esophageal varices without bleeding: Secondary | ICD-10-CM

## 2017-05-30 DIAGNOSIS — E46 Unspecified protein-calorie malnutrition: Secondary | ICD-10-CM

## 2017-05-30 DIAGNOSIS — E559 Vitamin D deficiency, unspecified: Secondary | ICD-10-CM

## 2017-05-30 DIAGNOSIS — G894 Chronic pain syndrome: Secondary | ICD-10-CM

## 2017-05-30 DIAGNOSIS — C220 Liver cell carcinoma: Principal | ICD-10-CM

## 2017-05-30 DIAGNOSIS — G4700 Insomnia, unspecified: Secondary | ICD-10-CM

## 2017-05-30 DIAGNOSIS — J942 Hemothorax: Secondary | ICD-10-CM

## 2017-05-30 DIAGNOSIS — M961 Postlaminectomy syndrome, not elsewhere classified: Secondary | ICD-10-CM

## 2017-05-30 DIAGNOSIS — J9589 Other postprocedural complications and disorders of respiratory system, not elsewhere classified: Secondary | ICD-10-CM

## 2017-05-30 DIAGNOSIS — E1142 Type 2 diabetes mellitus with diabetic polyneuropathy: Secondary | ICD-10-CM

## 2017-05-30 DIAGNOSIS — K219 Gastro-esophageal reflux disease without esophagitis: Secondary | ICD-10-CM

## 2017-05-30 DIAGNOSIS — K8050 Calculus of bile duct without cholangitis or cholecystitis without obstruction: Secondary | ICD-10-CM

## 2017-05-30 DIAGNOSIS — I272 Pulmonary hypertension, unspecified: Secondary | ICD-10-CM

## 2017-05-30 DIAGNOSIS — B182 Chronic viral hepatitis C: Secondary | ICD-10-CM

## 2017-05-30 DIAGNOSIS — Z931 Gastrostomy status: Secondary | ICD-10-CM

## 2017-05-30 DIAGNOSIS — D696 Thrombocytopenia, unspecified: Secondary | ICD-10-CM

## 2017-05-30 DIAGNOSIS — N400 Enlarged prostate without lower urinary tract symptoms: Secondary | ICD-10-CM

## 2017-05-30 DIAGNOSIS — I480 Paroxysmal atrial fibrillation: Secondary | ICD-10-CM

## 2017-05-30 DIAGNOSIS — Z794 Long term (current) use of insulin: Secondary | ICD-10-CM

## 2017-05-30 DIAGNOSIS — K7460 Unspecified cirrhosis of liver: Secondary | ICD-10-CM

## 2017-05-30 DIAGNOSIS — Z944 Liver transplant status: Secondary | ICD-10-CM

## 2017-05-30 DIAGNOSIS — D630 Anemia in neoplastic disease: Secondary | ICD-10-CM

## 2017-05-30 DIAGNOSIS — Z950 Presence of cardiac pacemaker: Secondary | ICD-10-CM

## 2017-05-30 DIAGNOSIS — Z7901 Long term (current) use of anticoagulants: Secondary | ICD-10-CM

## 2017-05-30 DIAGNOSIS — Z930 Tracheostomy status: Secondary | ICD-10-CM

## 2017-05-30 DIAGNOSIS — I5030 Unspecified diastolic (congestive) heart failure: Secondary | ICD-10-CM

## 2017-05-30 DIAGNOSIS — K7290 Hepatic failure, unspecified without coma: Secondary | ICD-10-CM

## 2017-05-30 NOTE — Unmapped (Signed)
Left VM requesting patient get labs this week and that per NP Martin-Velez we will see him next week in clinic.  Sent TPA message to set up and to follow up about status of ERCP.

## 2017-05-31 ENCOUNTER — Institutional Professional Consult (permissible substitution)
Admit: 2017-05-31 | Discharge: 2017-05-31 | Payer: MEDICARE | Attending: Student in an Organized Health Care Education/Training Program | Primary: Student in an Organized Health Care Education/Training Program

## 2017-05-31 ENCOUNTER — Ambulatory Visit: Admit: 2017-05-31 | Discharge: 2017-05-31 | Payer: MEDICARE

## 2017-05-31 DIAGNOSIS — R490 Dysphonia: Principal | ICD-10-CM

## 2017-05-31 DIAGNOSIS — J3801 Paralysis of vocal cords and larynx, unilateral: Secondary | ICD-10-CM

## 2017-05-31 DIAGNOSIS — K769 Liver disease, unspecified: Secondary | ICD-10-CM

## 2017-05-31 DIAGNOSIS — Z7901 Long term (current) use of anticoagulants: Secondary | ICD-10-CM

## 2017-05-31 DIAGNOSIS — Z79899 Other long term (current) drug therapy: Secondary | ICD-10-CM

## 2017-05-31 DIAGNOSIS — Z5181 Encounter for therapeutic drug level monitoring: Secondary | ICD-10-CM

## 2017-05-31 DIAGNOSIS — Z944 Liver transplant status: Principal | ICD-10-CM

## 2017-05-31 LAB — COMPREHENSIVE METABOLIC PANEL
ALBUMIN: 3.1 g/dL — ABNORMAL LOW (ref 3.5–5.0)
ALKALINE PHOSPHATASE: 158 U/L — ABNORMAL HIGH (ref 38–126)
ALT (SGPT): 17 U/L — ABNORMAL LOW (ref 19–72)
ANION GAP: 8 mmol/L — ABNORMAL LOW (ref 9–15)
AST (SGOT): 19 U/L (ref 19–55)
BILIRUBIN TOTAL: 1.1 mg/dL (ref 0.0–1.2)
BLOOD UREA NITROGEN: 34 mg/dL — ABNORMAL HIGH (ref 7–21)
BUN / CREAT RATIO: 21
CALCIUM: 10.2 mg/dL (ref 8.5–10.2)
CHLORIDE: 106 mmol/L (ref 98–107)
CO2: 24 mmol/L (ref 22.0–30.0)
CREATININE: 1.63 mg/dL — ABNORMAL HIGH (ref 0.70–1.30)
EGFR MDRD AF AMER: 51 mL/min/{1.73_m2} — ABNORMAL LOW (ref >=60)
EGFR MDRD NON AF AMER: 42 mL/min/{1.73_m2} — ABNORMAL LOW (ref >=60)
GLUCOSE RANDOM: 190 mg/dL — ABNORMAL HIGH (ref 65–179)
POTASSIUM: 4.8 mmol/L (ref 3.5–5.0)
SODIUM: 138 mmol/L (ref 135–145)

## 2017-05-31 LAB — CBC W/ AUTO DIFF
BASOPHILS RELATIVE PERCENT: 0.6 %
EOSINOPHILS ABSOLUTE COUNT: 0 10*9/L (ref 0.0–0.4)
EOSINOPHILS RELATIVE PERCENT: 0.9 %
HEMATOCRIT: 29.1 % — ABNORMAL LOW (ref 41.0–53.0)
HEMOGLOBIN: 9.4 g/dL — ABNORMAL LOW (ref 13.5–17.5)
LARGE UNSTAINED CELLS: 2 % (ref 0–4)
LYMPHOCYTES ABSOLUTE COUNT: 0.5 10*9/L — ABNORMAL LOW (ref 1.5–5.0)
LYMPHOCYTES RELATIVE PERCENT: 13.8 %
MEAN CORPUSCULAR HEMOGLOBIN CONC: 32.3 g/dL (ref 31.0–37.0)
MEAN CORPUSCULAR HEMOGLOBIN: 29.4 pg (ref 26.0–34.0)
MEAN CORPUSCULAR VOLUME: 90.8 fL (ref 80.0–100.0)
MONOCYTES ABSOLUTE COUNT: 0.2 10*9/L (ref 0.2–0.8)
MONOCYTES RELATIVE PERCENT: 5.9 %
NEUTROPHILS ABSOLUTE COUNT: 2.8 10*9/L (ref 2.0–7.5)
NEUTROPHILS RELATIVE PERCENT: 76.6 %
PLATELET COUNT: 119 10*9/L — ABNORMAL LOW (ref 150–440)
RED BLOOD CELL COUNT: 3.21 10*12/L — ABNORMAL LOW (ref 4.50–5.90)
RED CELL DISTRIBUTION WIDTH: 17.1 % — ABNORMAL HIGH (ref 12.0–15.0)

## 2017-05-31 LAB — INR: Lab: 1.22

## 2017-05-31 LAB — BILIRUBIN DIRECT: Bilirubin.glucuronidated:MCnc:Pt:Ser/Plas:Qn:: <0.1

## 2017-05-31 LAB — BASOPHILS RELATIVE PERCENT: Lab: 0.6

## 2017-05-31 LAB — GAMMA GLUTAMYL TRANSFERASE: Gamma glutamyl transferase:CCnc:Pt:Ser/Plas:Qn:: 92

## 2017-05-31 LAB — ALT (SGPT): Alanine aminotransferase:CCnc:Pt:Ser/Plas:Qn:: 17 — ABNORMAL LOW

## 2017-05-31 LAB — MAGNESIUM: Magnesium:MCnc:Pt:Ser/Plas:Qn:: 1.8

## 2017-05-31 LAB — PROTIME-INR: INR: 1.22

## 2017-05-31 LAB — CYCLOSPORINE, TROUGH: Lab: 162

## 2017-05-31 LAB — PHOSPHORUS: Phosphate:MCnc:Pt:Ser/Plas:Qn:: 3.2

## 2017-05-31 MED ORDER — METHYLPREDNISOLONE 4 MG TABLETS IN A DOSE PACK
ORAL_TABLET | 0 refills | 0 days | Status: CP
Start: 2017-05-31 — End: 2017-05-31

## 2017-05-31 MED ORDER — METHYLPREDNISOLONE 4 MG TABLETS IN A DOSE PACK: tablet | 0 refills | 0 days

## 2017-05-31 MED FILL — OXYCODONE HCL/5MG/TABS: OXYCODONE HCL/5MG/TABS | 5 days supply | Qty: 30 | Fill #0

## 2017-05-31 MED FILL — METHYLPRED DOSEPAK/4MG/TABS: METHYLPRED DOSEPAK/4MG/TABS | 6 days supply | Qty: 1 | Fill #0

## 2017-05-31 NOTE — Unmapped (Addendum)
POST-PROCEDURE INSTRUCTIONS    General Post-Procedure Instructions:  1. You may resume your regular diet once your anesthetic has .  2. You can take Tylenol as needed for pain  3. You may resume your regular physical activities  4. You may resume all your normal medications, unless specifically instructed differently by your Doctor.    Please contact our office if:   1. You experience shortness of breath or difficulty breathing  2. You have any worsening swallowing liquids or solids  3. You have a fever over 101.5  4. You have any bleeding or severe bruising  5. You experience any other unexpected symptoms including but not limited to changes in mental status, weakness, nausea, vomiting.    You have been prescribed an oral steroid medication. Please pick up from your pharmacy if you have not already done so and take as directed.   Follow up:   4 Weeks [employer].

## 2017-05-31 NOTE — Unmapped (Signed)
Error

## 2017-05-31 NOTE — Unmapped (Signed)
Surgeon: Nira Retort. Sherryll Burger, MD    Pre-op Diagnosis:   Right vocal fold immobility  Dysphonia    Post-Op Diagnosis:   Right vocal fold immobility  Dysphonia    Procedures:   Flexible laryngoscopy with injection augmentation vocal fold (CPT: [address])     A time out identifying the patient, the procedure, the location of the procedure and any concerns was performed prior to beginning the procedure.  Written consent was obtained.    Procedure:   The patient was seated in an upright position.  The nose was de-congested and anesthetized with oxymetazoline and 1% lidocaine.  Inhalation of 1% lidocaine was also undertaken.  The injectable material was appropriately prepared in the usual fashion.  The patient was prepped and draped in the usual fashion.  1% lidocaine with 1:100,000 epinephrine was used for local anesthesia of the skin overlying the thyrohyoid membrane. Flexible laryngoscope was placed into the nasal cavity and advanced to the larynx.  Transcervical laryngeal gargle local anesthesia was undertaken.  22G needle was inserted over they thyroid notch and into the laryngeal lumen.  Approximately 1.71ml 4% lidocaine was applied over the glottis with the patient in phonation and into the preepiglottic space.  Once appropriate local anesthesia was obtained, we proceeded with augmentation.     22G needle was inserted over the thyroid notch and into the laryngeal lumen.  Needle was inserted into the appropriate vocal fold in a lateral position.  Approximately 0.6 ml of cymetra was injected into the right fold.     Findings:     A total of 0.74ml cymetra was injected. The patient tolerated the procedure well.     Estimated Blood Loss (in mL): 1    Complications:  None     Attending Notes:     I performed the entire procedure

## 2017-06-01 DIAGNOSIS — K219 Gastro-esophageal reflux disease without esophagitis: Secondary | ICD-10-CM

## 2017-06-01 DIAGNOSIS — K8050 Calculus of bile duct without cholangitis or cholecystitis without obstruction: Secondary | ICD-10-CM

## 2017-06-01 DIAGNOSIS — D630 Anemia in neoplastic disease: Secondary | ICD-10-CM

## 2017-06-01 DIAGNOSIS — Z944 Liver transplant status: Secondary | ICD-10-CM

## 2017-06-01 DIAGNOSIS — E46 Unspecified protein-calorie malnutrition: Secondary | ICD-10-CM

## 2017-06-01 DIAGNOSIS — D696 Thrombocytopenia, unspecified: Secondary | ICD-10-CM

## 2017-06-01 DIAGNOSIS — E559 Vitamin D deficiency, unspecified: Secondary | ICD-10-CM

## 2017-06-01 DIAGNOSIS — I272 Pulmonary hypertension, unspecified: Secondary | ICD-10-CM

## 2017-06-01 DIAGNOSIS — K7290 Hepatic failure, unspecified without coma: Secondary | ICD-10-CM

## 2017-06-01 DIAGNOSIS — I8510 Secondary esophageal varices without bleeding: Secondary | ICD-10-CM

## 2017-06-01 DIAGNOSIS — N400 Enlarged prostate without lower urinary tract symptoms: Secondary | ICD-10-CM

## 2017-06-01 DIAGNOSIS — B182 Chronic viral hepatitis C: Secondary | ICD-10-CM

## 2017-06-01 DIAGNOSIS — G894 Chronic pain syndrome: Secondary | ICD-10-CM

## 2017-06-01 DIAGNOSIS — J942 Hemothorax: Secondary | ICD-10-CM

## 2017-06-01 DIAGNOSIS — Z950 Presence of cardiac pacemaker: Secondary | ICD-10-CM

## 2017-06-01 DIAGNOSIS — K7460 Unspecified cirrhosis of liver: Secondary | ICD-10-CM

## 2017-06-01 DIAGNOSIS — I480 Paroxysmal atrial fibrillation: Secondary | ICD-10-CM

## 2017-06-01 DIAGNOSIS — Z7901 Long term (current) use of anticoagulants: Secondary | ICD-10-CM

## 2017-06-01 DIAGNOSIS — Z794 Long term (current) use of insulin: Secondary | ICD-10-CM

## 2017-06-01 DIAGNOSIS — E1142 Type 2 diabetes mellitus with diabetic polyneuropathy: Secondary | ICD-10-CM

## 2017-06-01 DIAGNOSIS — Z930 Tracheostomy status: Secondary | ICD-10-CM

## 2017-06-01 DIAGNOSIS — M961 Postlaminectomy syndrome, not elsewhere classified: Secondary | ICD-10-CM

## 2017-06-01 DIAGNOSIS — G4700 Insomnia, unspecified: Secondary | ICD-10-CM

## 2017-06-01 DIAGNOSIS — J9589 Other postprocedural complications and disorders of respiratory system, not elsewhere classified: Secondary | ICD-10-CM

## 2017-06-01 DIAGNOSIS — C220 Liver cell carcinoma: Principal | ICD-10-CM

## 2017-06-01 DIAGNOSIS — Z931 Gastrostomy status: Secondary | ICD-10-CM

## 2017-06-01 DIAGNOSIS — I5030 Unspecified diastolic (congestive) heart failure: Secondary | ICD-10-CM

## 2017-06-01 NOTE — Unmapped (Signed)
-----   Message from Dixon Boos, RN sent at 05/30/2017  2:58 PM EDT -----  Regarding: Apt. next week and ERCP?  Hey  1. Can you please make an appointment next week for patient with Dr. Matilde Haymaker?  If not available NP Martin-Velez is willing to see him herself (if you book with NP please don't book on Monday since I am out that day)  2. Can you try to find out what is going on about his no show ERCP?  I tried calling GI today but was on hold for awhile and then disconnected.    Thank you,  Mariea Stable RN, BSN, [facility]  Liver Transplant Coordinator   Phone: [phone]  Fax: [phone]

## 2017-06-01 NOTE — Unmapped (Addendum)
Called pt in reference to subtherapeutic INR. [name]'s wife who reported he just restarted Coumadin on 3/18 @ night and the labs were drawn on 3/20. Per Ermalinda Barrios FNP's recommendation,asked her to have pt repeat labs on Monday to recheck INR. She reports pt has been struggling with oral intake and she has now added ensure drinks to his diet. Encouraged her to give him small more frequent meals. Asked her to contact coordinator if pt's dietary intake remains low and she does not see any improvement in energy levels. She reported pt fell yesterday and she has stressed to him the importance for calling for help. She reported he has a bruise on his back where he fell and has been taking tramadol for pain. Per wife, he has no SOB or any new symptoms post fall. Pt is scheduled to see surgery on 3/28 for f/u. Pt's wife verbalized understanding of all information reviewed.

## 2017-06-01 NOTE — Unmapped (Signed)
As per coordinator request called ERCP to inquire why patients 3/18 was cancelled. Spoke with Tresa Endo who stated they are not able to see patient on their past schedule but will check their templet and call me back at [phone].    Patient is scheduled for txp appts on 06/08/2017 which includes surgeon appointment. Called wife to discuss appointment schedule. Wife inquired the reason for the visit, informed wife this request came from nurse coordinator and I check last note but not sure why patient needs to be seen. Informed wife she can call amelia at [phone] on Tuesday(3/26) to discuss the appointment. Wife stated verbal understanding.

## 2017-06-02 DIAGNOSIS — K7460 Unspecified cirrhosis of liver: Secondary | ICD-10-CM

## 2017-06-02 DIAGNOSIS — M961 Postlaminectomy syndrome, not elsewhere classified: Secondary | ICD-10-CM

## 2017-06-02 DIAGNOSIS — D630 Anemia in neoplastic disease: Secondary | ICD-10-CM

## 2017-06-02 DIAGNOSIS — I480 Paroxysmal atrial fibrillation: Secondary | ICD-10-CM

## 2017-06-02 DIAGNOSIS — I272 Pulmonary hypertension, unspecified: Secondary | ICD-10-CM

## 2017-06-02 DIAGNOSIS — E46 Unspecified protein-calorie malnutrition: Secondary | ICD-10-CM

## 2017-06-02 DIAGNOSIS — J942 Hemothorax: Secondary | ICD-10-CM

## 2017-06-02 DIAGNOSIS — E559 Vitamin D deficiency, unspecified: Secondary | ICD-10-CM

## 2017-06-02 DIAGNOSIS — D696 Thrombocytopenia, unspecified: Secondary | ICD-10-CM

## 2017-06-02 DIAGNOSIS — N400 Enlarged prostate without lower urinary tract symptoms: Secondary | ICD-10-CM

## 2017-06-02 DIAGNOSIS — I5030 Unspecified diastolic (congestive) heart failure: Secondary | ICD-10-CM

## 2017-06-02 DIAGNOSIS — Z944 Liver transplant status: Secondary | ICD-10-CM

## 2017-06-02 DIAGNOSIS — K7290 Hepatic failure, unspecified without coma: Secondary | ICD-10-CM

## 2017-06-02 DIAGNOSIS — Z950 Presence of cardiac pacemaker: Secondary | ICD-10-CM

## 2017-06-02 DIAGNOSIS — K219 Gastro-esophageal reflux disease without esophagitis: Secondary | ICD-10-CM

## 2017-06-02 DIAGNOSIS — G894 Chronic pain syndrome: Secondary | ICD-10-CM

## 2017-06-02 DIAGNOSIS — J9589 Other postprocedural complications and disorders of respiratory system, not elsewhere classified: Secondary | ICD-10-CM

## 2017-06-02 DIAGNOSIS — K8050 Calculus of bile duct without cholangitis or cholecystitis without obstruction: Secondary | ICD-10-CM

## 2017-06-02 DIAGNOSIS — E1142 Type 2 diabetes mellitus with diabetic polyneuropathy: Secondary | ICD-10-CM

## 2017-06-02 DIAGNOSIS — I8510 Secondary esophageal varices without bleeding: Secondary | ICD-10-CM

## 2017-06-02 DIAGNOSIS — Z794 Long term (current) use of insulin: Secondary | ICD-10-CM

## 2017-06-02 DIAGNOSIS — B182 Chronic viral hepatitis C: Secondary | ICD-10-CM

## 2017-06-02 DIAGNOSIS — Z930 Tracheostomy status: Secondary | ICD-10-CM

## 2017-06-02 DIAGNOSIS — Z931 Gastrostomy status: Secondary | ICD-10-CM

## 2017-06-02 DIAGNOSIS — G4700 Insomnia, unspecified: Secondary | ICD-10-CM

## 2017-06-02 DIAGNOSIS — C220 Liver cell carcinoma: Principal | ICD-10-CM

## 2017-06-02 DIAGNOSIS — Z7901 Long term (current) use of anticoagulants: Secondary | ICD-10-CM

## 2017-06-05 NOTE — Unmapped (Signed)
[facility] Refill Coordination Note  Specialty Medication(s): Neoral 25mg  and 100mg , mycophenolate   Additional Medications shipped: warfarin    Jeffrey Ward, DOB: October 20, 1950  Phone: [phone] (home) , Alternate phone contact: N/A  Phone or address changes today?: No  All above HIPAA information was verified with patient.  Shipping Address: [address]  [place] [address]   Insurance changes? No    Completed refill call assessment today to schedule patient's medication shipment from the [facility] [phone]).      Confirmed the medication and dosage are correct and have not changed: Yes, regimen is correct and unchanged.    Confirmed patient started or stopped the following medications in the past month:  No, there are no changes reported at this time.    Are you tolerating your medication?:  Meet reports tolerating the medication.    ADHERENCE    Is this medicine transplant or covered by Medicare Part B? Yes.    Neoral 100 mg   Quantity filled last month: 120   # of tablets left on hand: 14  Neoral 25 mg   Quantity filled last month: n/a   # of tablets left on hand: 0  Mycophenolate Mofetil 250 mg   Quantity filled last month: 120   # of tablets left on hand: 28    Did you miss any doses in the past 4 weeks? No missed doses reported.    FINANCIAL/SHIPPING    Delivery Scheduled: Yes, Expected medication delivery date: 3/27     The patient will receive an FSI print out for each medication shipped and additional FDA Medication Guides as required.  Patient education from [place] or Robet Leu may also be included in the shipment.    Wash did not have any additional questions at this time.    Delivery address validated in FSI scheduling system: Yes, address listed in FSI is correct.    We will follow up with patient monthly for standard refill processing and delivery.      Thank you,  Clydell Hakim   [facility] Specialty Pharmacist

## 2017-06-06 DIAGNOSIS — I480 Paroxysmal atrial fibrillation: Secondary | ICD-10-CM

## 2017-06-06 DIAGNOSIS — Z794 Long term (current) use of insulin: Secondary | ICD-10-CM

## 2017-06-06 DIAGNOSIS — Z931 Gastrostomy status: Secondary | ICD-10-CM

## 2017-06-06 DIAGNOSIS — E1142 Type 2 diabetes mellitus with diabetic polyneuropathy: Secondary | ICD-10-CM

## 2017-06-06 DIAGNOSIS — C220 Liver cell carcinoma: Principal | ICD-10-CM

## 2017-06-06 DIAGNOSIS — D630 Anemia in neoplastic disease: Secondary | ICD-10-CM

## 2017-06-06 DIAGNOSIS — M961 Postlaminectomy syndrome, not elsewhere classified: Secondary | ICD-10-CM

## 2017-06-06 DIAGNOSIS — G4700 Insomnia, unspecified: Secondary | ICD-10-CM

## 2017-06-06 DIAGNOSIS — K7460 Unspecified cirrhosis of liver: Secondary | ICD-10-CM

## 2017-06-06 DIAGNOSIS — Z7901 Long term (current) use of anticoagulants: Secondary | ICD-10-CM

## 2017-06-06 DIAGNOSIS — D696 Thrombocytopenia, unspecified: Secondary | ICD-10-CM

## 2017-06-06 DIAGNOSIS — I5030 Unspecified diastolic (congestive) heart failure: Secondary | ICD-10-CM

## 2017-06-06 DIAGNOSIS — Z950 Presence of cardiac pacemaker: Secondary | ICD-10-CM

## 2017-06-06 DIAGNOSIS — K7290 Hepatic failure, unspecified without coma: Secondary | ICD-10-CM

## 2017-06-06 DIAGNOSIS — E559 Vitamin D deficiency, unspecified: Secondary | ICD-10-CM

## 2017-06-06 DIAGNOSIS — J942 Hemothorax: Secondary | ICD-10-CM

## 2017-06-06 DIAGNOSIS — Z944 Liver transplant status: Secondary | ICD-10-CM

## 2017-06-06 DIAGNOSIS — G894 Chronic pain syndrome: Secondary | ICD-10-CM

## 2017-06-06 DIAGNOSIS — K219 Gastro-esophageal reflux disease without esophagitis: Secondary | ICD-10-CM

## 2017-06-06 DIAGNOSIS — K8050 Calculus of bile duct without cholangitis or cholecystitis without obstruction: Secondary | ICD-10-CM

## 2017-06-06 DIAGNOSIS — N400 Enlarged prostate without lower urinary tract symptoms: Secondary | ICD-10-CM

## 2017-06-06 DIAGNOSIS — B182 Chronic viral hepatitis C: Secondary | ICD-10-CM

## 2017-06-06 DIAGNOSIS — Z930 Tracheostomy status: Secondary | ICD-10-CM

## 2017-06-06 DIAGNOSIS — E46 Unspecified protein-calorie malnutrition: Secondary | ICD-10-CM

## 2017-06-06 DIAGNOSIS — I272 Pulmonary hypertension, unspecified: Secondary | ICD-10-CM

## 2017-06-06 DIAGNOSIS — I8510 Secondary esophageal varices without bleeding: Secondary | ICD-10-CM

## 2017-06-06 DIAGNOSIS — J9589 Other postprocedural complications and disorders of respiratory system, not elsewhere classified: Secondary | ICD-10-CM

## 2017-06-06 MED FILL — MYCOPHENOLATE MOFETIL/250MG/CAPS: MYCOPHENOLATE MOFETIL/250MG/CAPS | 30 days supply | Qty: 120 | Fill #3

## 2017-06-06 MED FILL — WARFARIN/3MG/TAB: WARFARIN/3MG/TAB | 30 days supply | Qty: 30 | Fill #3

## 2017-06-06 MED FILL — NEORAL/25MG/CAP: NEORAL/25MG/CAP | 30 days supply | Qty: 180 | Fill #0

## 2017-06-06 MED FILL — NEORAL/100MG/CAP: NEORAL/100MG/CAP | 30 days supply | Qty: 60 | Fill #0

## 2017-06-07 DIAGNOSIS — Z930 Tracheostomy status: Secondary | ICD-10-CM

## 2017-06-07 DIAGNOSIS — J9589 Other postprocedural complications and disorders of respiratory system, not elsewhere classified: Secondary | ICD-10-CM

## 2017-06-07 DIAGNOSIS — I480 Paroxysmal atrial fibrillation: Secondary | ICD-10-CM

## 2017-06-07 DIAGNOSIS — I272 Pulmonary hypertension, unspecified: Secondary | ICD-10-CM

## 2017-06-07 DIAGNOSIS — K219 Gastro-esophageal reflux disease without esophagitis: Secondary | ICD-10-CM

## 2017-06-07 DIAGNOSIS — E46 Unspecified protein-calorie malnutrition: Secondary | ICD-10-CM

## 2017-06-07 DIAGNOSIS — K8050 Calculus of bile duct without cholangitis or cholecystitis without obstruction: Secondary | ICD-10-CM

## 2017-06-07 DIAGNOSIS — J942 Hemothorax: Secondary | ICD-10-CM

## 2017-06-07 DIAGNOSIS — G4700 Insomnia, unspecified: Secondary | ICD-10-CM

## 2017-06-07 DIAGNOSIS — I8510 Secondary esophageal varices without bleeding: Secondary | ICD-10-CM

## 2017-06-07 DIAGNOSIS — K7460 Unspecified cirrhosis of liver: Secondary | ICD-10-CM

## 2017-06-07 DIAGNOSIS — C220 Liver cell carcinoma: Principal | ICD-10-CM

## 2017-06-07 DIAGNOSIS — G894 Chronic pain syndrome: Secondary | ICD-10-CM

## 2017-06-07 DIAGNOSIS — D630 Anemia in neoplastic disease: Secondary | ICD-10-CM

## 2017-06-07 DIAGNOSIS — Z7901 Long term (current) use of anticoagulants: Secondary | ICD-10-CM

## 2017-06-07 DIAGNOSIS — E559 Vitamin D deficiency, unspecified: Secondary | ICD-10-CM

## 2017-06-07 DIAGNOSIS — Z950 Presence of cardiac pacemaker: Secondary | ICD-10-CM

## 2017-06-07 DIAGNOSIS — Z931 Gastrostomy status: Secondary | ICD-10-CM

## 2017-06-07 DIAGNOSIS — D696 Thrombocytopenia, unspecified: Secondary | ICD-10-CM

## 2017-06-07 DIAGNOSIS — I5030 Unspecified diastolic (congestive) heart failure: Secondary | ICD-10-CM

## 2017-06-07 DIAGNOSIS — Z794 Long term (current) use of insulin: Secondary | ICD-10-CM

## 2017-06-07 DIAGNOSIS — K7290 Hepatic failure, unspecified without coma: Secondary | ICD-10-CM

## 2017-06-07 DIAGNOSIS — E1142 Type 2 diabetes mellitus with diabetic polyneuropathy: Secondary | ICD-10-CM

## 2017-06-07 DIAGNOSIS — N400 Enlarged prostate without lower urinary tract symptoms: Secondary | ICD-10-CM

## 2017-06-07 DIAGNOSIS — M961 Postlaminectomy syndrome, not elsewhere classified: Secondary | ICD-10-CM

## 2017-06-07 DIAGNOSIS — Z944 Liver transplant status: Secondary | ICD-10-CM

## 2017-06-07 DIAGNOSIS — B182 Chronic viral hepatitis C: Secondary | ICD-10-CM

## 2017-06-07 NOTE — Unmapped (Addendum)
No cancer seen.  Non hepatic part of the biopsy with granulomas with dense fibrous tissue.  Hepatic abscess suspected but limited biopsy due to surrounding inflammation.  Portal inflammation that is scattered. But did not qualify for definite bile duct damage.  Negative for rejection.  Appears to be inflammatory process due to infection.    Left side of the biopsy with dense fibrosis with frank necrotic debris.  Tested AFB and GMS which were negative.    Plan is for infectious disease to do a serological work up.  Add on with ID for tomorrow appointment at 11 am and a Abdominal CT without contrast to assess the abscesses size.      Per Dr. Celine Mans ERCP can wait if LFTs are stable until patient infection is treated.  Reviewed appointments with wife and she verbalized understanding.

## 2017-06-07 NOTE — Unmapped (Addendum)
Outpatient Adult Nutrition-Transplant Follow Up    Referring MD or Clinic: Liver Transplant Clinic    Reason for Referral: Follow-up  Other; s/p liver txp 09/15/16    Brief visit; pt minimally interactive 2/2 pain. Concerned about ? broken rib.    PMH:   Patient Active Problem List   Diagnosis   ??? Hepatitis C   ??? Low back pain   ??? Hematuria   ??? Calculus of ureter   ??? Anemia   ??? Benign prostatic hyperplasia   ??? Chronic pain disorder   ??? Dysrhythmia   ??? History of gastroesophageal reflux (GERD)   ??? History of hepatitis C virus infection   ??? History of substance abuse   ??? Hyperglycemia, unspecified   ??? Intermittent vertigo   ??? Microscopic hematuria   ??? Postlaminectomy syndrome, lumbar region   ??? Thrombocytopenia (CMS-HCC)   ??? Vitamin D deficiency   ??? Atrial fibrillation with RVR (CMS-HCC)   ??? Secondary esophageal varices (CMS-HCC)   ??? Right ventricular dilation   ??? End-stage liver disease (CMS-HCC)   ??? HCC (hepatocellular carcinoma) (CMS-HCC)   ??? Paroxysmal atrial fibrillation (CMS-HCC)   ??? Hepatic encephalopathy (CMS-HCC)   ??? S/P liver transplant (CMS-HCC)   ??? Pulmonary hypertension, moderate to severe (CMS-HCC)   ??? Acute right heart failure (CMS-HCC)   ??? Tracheostomy dependent (CMS-HCC)   ??? Mixed level of activity delirium due to multiple etiologies, resolved   ??? Insomnia   ??? Peripheral neuropathy   ??? Influenza A   ??? Hemothorax   ??? Gastrostomy tube in place (CMS-HCC)   ??? H/O tracheostomy   ??? S/P placement of cardiac pacemaker   ??? Uncontrolled type 2 diabetes mellitus without complication, with long-term current use of insulin (CMS-HCC)     Anthropometric Data:  -- Height: 172.7 cm (5' 7.99)   -- Last recorded weight: 59.4 kg (131 lb)   -- IBW: 69.9 kg  -- Percent IBW: ~84%  -- AdjIBW: N/A  -- BMI: Body mass index is 19.92 kg/m??.; WNL    -- Weight history: suspect wt recorded on 3/17 is a reflection of fluid status as pt was admitted at this time. If so, he has been maintaining his weight for the past few weeks; slightly up over past ~1 month.  Wt Readings from Last 10 Encounters:   06/08/17 59 kg (130 lb)   06/08/17 58.1 kg (128 lb)   06/08/17 58.1 kg (128 lb)   06/08/17 59.4 kg (131 lb)   05/28/17 63.1 kg (139 lb 1.8 oz)   05/24/17 59.9 kg (132 lb)   05/04/17 58.7 kg (129 lb 8 oz)   05/04/17 58.7 kg (129 lb 8 oz)   05/04/17 58.7 kg (129 lb 8 oz)   05/01/17 59.5 kg (131 lb 1.6 oz)     Nutrition-Focused Physical Findings:   Unable to complete NFPE 2/2 pt discomfort - suspect muscle/fat loss has worsened since last assessment.   Fat Areas Examined  Orbital: Mild/moderate loss  Upper Arm: Mild/moderate loss????????????????????????????????????????????  ??  Muscle Areas Examined  Temple: Severe loss  Clavicle: Severe loss  Acromion: Severe loss  Scapular: Severe loss  Anterior Thigh: Mild/moderate loss  Posterior Calf: Mild/moderate loss  ??  ??  ??  Nutrition Evaluation  Overall Impressions: Moderate fat loss;Severe muscle loss (10/25/16 1503)    Relevant Medications, Herbs, Supplements include: Vit D3, insulin, magnesium plus protein, warfarin, remeron    Relevant Labs: reviewed; K+ hemolyzed, CRP 70.5    Physical Activity:  sleeping a lot - goes to sleep 7-8pm, wakes up at noon    Dietary Restrictions, Intolerances: n/a     Allergies: No Known Allergies    Hunger and Satiety: poor    24-Hour recall/usual intake:   1st - noon - slice bacon or sausage link, 1 egg w/ cheese & butter, Premier Protein  Snack - n/a  2nd - chicken salad w/ crackers, fruit or sandwich (whatever he decides he wants)  Snack - n/a  3rd - meat (tenderloin), baked potato or mashed potatoes, vegetable, Premier Protein  Snack - n/a  Beverages - water    Nutrition History: pt reports feeling sluggish and weak; has fallen several times over the past few weeks. Concerned about broken rib and pain following most recent fall; plan for possible CXR after meeting with surgeon. No reports of nausea/vomiting although per chart review, pt has been vomiting often. Consuming 2 Premier Protein per day on average; previously encouraged pt to consume at least 3.     Social: accompanied by wife     Estimated Needs:   Estimated Energy Needs: [phone] kcal/day (30-35 kcal/kg actual BW for weight gain)  Estimated Protein Needs:  70-88 g pro/day (1.2-1.5 g/kg actual BW)  Estimated Fluid Needs:  per MD    Nutrition Assessment: intake is inadequate to meet calorie/protein needs. Pt has suppressed appetite 2/2 pain and discomfort. He is consuming 2 Premier Protein shakes a day but requires additional supplementation to meet nutrient needs. Discussed consuming another supplement if unable to consume >75% of provided meals. Encouraged pt to eat/drink every ~3 hrs to promote PO intake and weight gain.     Malnutrition Assessment using AND/ASPEN Clinical Characteristics:    Severe Protein-Calorie Malnutrition in the context of acute illness or injury (06/08/17 1353)     Energy Intake: < or equal to 50% of estimated energy requirement of > or equal to 5 days  Fat Loss: Moderate  Muscle Loss: Moderate                Nutrition Goals:   1. Meet estimated daily needs  2. Balanced macronutrient intake  3. Prevent wt loss    Interventions:  1. Eat meal, snack or supplement every ~3 hrs  2. Consume 3 Glucerna/Premier Protein/CIB Light Start per day  3. If unable to consume >50-75% of meal, consume additional supplement  4. Make PB crackers at home w/ 2tbsp PB instead of eating Nabs    Materials Provided were:  Tips and suggestions     Follow-up: PRN    Length of visit was: 10 minutes    Jackqulyn Livings MPH, RD, LDN  Pager: [phone]

## 2017-06-08 ENCOUNTER — Ambulatory Visit
Admit: 2017-06-08 | Discharge: 2017-06-11 | Disposition: A | Discharge status: Home Health | Payer: MEDICARE | Admitting: Student in an Organized Health Care Education/Training Program

## 2017-06-08 ENCOUNTER — Ambulatory Visit
Admit: 2017-06-08 | Discharge: 2017-06-11 | Disposition: A | Discharge status: Home Health | Payer: MEDICARE | Attending: Infectious Disease | Admitting: Student in an Organized Health Care Education/Training Program

## 2017-06-08 ENCOUNTER — Ambulatory Visit
Admit: 2017-06-08 | Discharge: 2017-06-11 | Disposition: A | Discharge status: Home Health | Payer: MEDICARE | Attending: Registered" | Admitting: Student in an Organized Health Care Education/Training Program

## 2017-06-08 ENCOUNTER — Ambulatory Visit
Admit: 2017-06-08 | Discharge: 2017-06-11 | Disposition: A | Discharge status: Home Health | Payer: MEDICARE | Attending: Pharmacist Clinician (PhC)/ Clinical Pharmacy Specialist | Admitting: Student in an Organized Health Care Education/Training Program

## 2017-06-08 ENCOUNTER — Ambulatory Visit
Admit: 2017-06-08 | Discharge: 2017-06-11 | Disposition: A | Discharge status: Home Health | Payer: MEDICARE | Attending: Student in an Organized Health Care Education/Training Program | Admitting: Student in an Organized Health Care Education/Training Program

## 2017-06-08 DIAGNOSIS — Z944 Liver transplant status: Secondary | ICD-10-CM

## 2017-06-08 DIAGNOSIS — J9589 Other postprocedural complications and disorders of respiratory system, not elsewhere classified: Secondary | ICD-10-CM

## 2017-06-08 DIAGNOSIS — J942 Hemothorax: Secondary | ICD-10-CM

## 2017-06-08 DIAGNOSIS — Z931 Gastrostomy status: Secondary | ICD-10-CM

## 2017-06-08 DIAGNOSIS — Z950 Presence of cardiac pacemaker: Secondary | ICD-10-CM

## 2017-06-08 DIAGNOSIS — D696 Thrombocytopenia, unspecified: Secondary | ICD-10-CM

## 2017-06-08 DIAGNOSIS — B182 Chronic viral hepatitis C: Secondary | ICD-10-CM

## 2017-06-08 DIAGNOSIS — K7460 Unspecified cirrhosis of liver: Secondary | ICD-10-CM

## 2017-06-08 DIAGNOSIS — K7290 Hepatic failure, unspecified without coma: Secondary | ICD-10-CM

## 2017-06-08 DIAGNOSIS — N400 Enlarged prostate without lower urinary tract symptoms: Secondary | ICD-10-CM

## 2017-06-08 DIAGNOSIS — G4700 Insomnia, unspecified: Secondary | ICD-10-CM

## 2017-06-08 DIAGNOSIS — M961 Postlaminectomy syndrome, not elsewhere classified: Secondary | ICD-10-CM

## 2017-06-08 DIAGNOSIS — G894 Chronic pain syndrome: Secondary | ICD-10-CM

## 2017-06-08 DIAGNOSIS — K769 Liver disease, unspecified: Principal | ICD-10-CM

## 2017-06-08 DIAGNOSIS — I5030 Unspecified diastolic (congestive) heart failure: Secondary | ICD-10-CM

## 2017-06-08 DIAGNOSIS — K219 Gastro-esophageal reflux disease without esophagitis: Secondary | ICD-10-CM

## 2017-06-08 DIAGNOSIS — D630 Anemia in neoplastic disease: Secondary | ICD-10-CM

## 2017-06-08 DIAGNOSIS — C220 Liver cell carcinoma: Principal | ICD-10-CM

## 2017-06-08 DIAGNOSIS — E46 Unspecified protein-calorie malnutrition: Secondary | ICD-10-CM

## 2017-06-08 DIAGNOSIS — Z7901 Long term (current) use of anticoagulants: Secondary | ICD-10-CM

## 2017-06-08 DIAGNOSIS — R079 Chest pain, unspecified: Principal | ICD-10-CM

## 2017-06-08 DIAGNOSIS — R0781 Pleurodynia: Principal | ICD-10-CM

## 2017-06-08 DIAGNOSIS — E1142 Type 2 diabetes mellitus with diabetic polyneuropathy: Secondary | ICD-10-CM

## 2017-06-08 DIAGNOSIS — I272 Pulmonary hypertension, unspecified: Secondary | ICD-10-CM

## 2017-06-08 DIAGNOSIS — K8050 Calculus of bile duct without cholangitis or cholecystitis without obstruction: Secondary | ICD-10-CM

## 2017-06-08 DIAGNOSIS — I480 Paroxysmal atrial fibrillation: Secondary | ICD-10-CM

## 2017-06-08 DIAGNOSIS — I8510 Secondary esophageal varices without bleeding: Secondary | ICD-10-CM

## 2017-06-08 DIAGNOSIS — Z794 Long term (current) use of insulin: Secondary | ICD-10-CM

## 2017-06-08 DIAGNOSIS — E559 Vitamin D deficiency, unspecified: Secondary | ICD-10-CM

## 2017-06-08 DIAGNOSIS — Z930 Tracheostomy status: Secondary | ICD-10-CM

## 2017-06-08 LAB — CBC W/ AUTO DIFF
BASOPHILS ABSOLUTE COUNT: 0 10*9/L (ref 0.0–0.1)
BASOPHILS RELATIVE PERCENT: 0.1 %
EOSINOPHILS ABSOLUTE COUNT: 0 10*9/L (ref 0.0–0.4)
HEMOGLOBIN: 10.3 g/dL — ABNORMAL LOW (ref 13.5–17.5)
LARGE UNSTAINED CELLS: 0 % (ref 0–4)
LYMPHOCYTES ABSOLUTE COUNT: 0.2 10*9/L — ABNORMAL LOW (ref 1.5–5.0)
LYMPHOCYTES RELATIVE PERCENT: 3.6 %
MEAN CORPUSCULAR HEMOGLOBIN CONC: 31.8 g/dL (ref 31.0–37.0)
MEAN CORPUSCULAR VOLUME: 90.6 fL (ref 80.0–100.0)
MEAN PLATELET VOLUME: 8.5 fL (ref 7.0–10.0)
MONOCYTES ABSOLUTE COUNT: 0.2 10*9/L (ref 0.2–0.8)
NEUTROPHILS ABSOLUTE COUNT: 4.6 10*9/L (ref 2.0–7.5)
NEUTROPHILS RELATIVE PERCENT: 90.9 %
PLATELET COUNT: 177 10*9/L (ref 150–440)
RED BLOOD CELL COUNT: 3.58 10*12/L — ABNORMAL LOW (ref 4.50–5.90)
RED CELL DISTRIBUTION WIDTH: 16.5 % — ABNORMAL HIGH (ref 12.0–15.0)
WBC ADJUSTED: 5.1 10*9/L (ref 4.5–11.0)

## 2017-06-08 LAB — COMPREHENSIVE METABOLIC PANEL
ALBUMIN: 3.3 g/dL — ABNORMAL LOW (ref 3.5–5.0)
ALT (SGPT): 250 U/L — ABNORMAL HIGH (ref 19–72)
BILIRUBIN TOTAL: 3 mg/dL — ABNORMAL HIGH (ref 0.0–1.2)
BLOOD UREA NITROGEN: 40 mg/dL — ABNORMAL HIGH (ref 7–21)
BUN / CREAT RATIO: 26
CALCIUM: 9.5 mg/dL (ref 8.5–10.2)
CHLORIDE: 100 mmol/L (ref 98–107)
CO2: 25 mmol/L (ref 22.0–30.0)
CREATININE: 1.53 mg/dL — ABNORMAL HIGH (ref 0.70–1.30)
EGFR MDRD AF AMER: 55 mL/min/{1.73_m2} — ABNORMAL LOW (ref >=60)
EGFR MDRD NON AF AMER: 45 mL/min/{1.73_m2} — ABNORMAL LOW (ref >=60)
GLUCOSE RANDOM: 199 mg/dL — ABNORMAL HIGH (ref 65–179)
PROTEIN TOTAL: 5.8 g/dL — ABNORMAL LOW (ref 6.5–8.3)
SODIUM: 131 mmol/L — ABNORMAL LOW (ref 135–145)

## 2017-06-08 LAB — INR: Lab: 1.68

## 2017-06-08 LAB — BLOOD GAS, VENOUS
BASE EXCESS VENOUS: 3.2 — ABNORMAL HIGH (ref -2.0–2.0)
HCO3 VENOUS: 28 mmol/L — ABNORMAL HIGH (ref 22–27)
O2 SATURATION VENOUS: 41.1 % (ref 40.0–85.0)
PCO2 VENOUS: 48 mmHg (ref 40–60)

## 2017-06-08 LAB — LACTATE BLOOD VENOUS: Lactate:SCnc:Pt:BldV:Qn:: 1.9 — ABNORMAL HIGH

## 2017-06-08 LAB — WBC ADJUSTED: Lab: 5.1

## 2017-06-08 LAB — APTT: Coagulation surface induced:Time:Pt:PPP:Qn:Coag: 34

## 2017-06-08 LAB — SMEAR REVIEW

## 2017-06-08 LAB — GLUCOSE RANDOM: Glucose:MCnc:Pt:Ser/Plas:Qn:: 199 — ABNORMAL HIGH

## 2017-06-08 LAB — ERYTHROCYTE SEDIMENTATION RATE: Lab: 82 — ABNORMAL HIGH

## 2017-06-08 LAB — HCO3 VENOUS: Bicarbonate:SCnc:Pt:BldA:Qn:: 28 — ABNORMAL HIGH

## 2017-06-08 LAB — C-REACTIVE PROTEIN: C reactive protein:MCnc:Pt:Ser/Plas:Qn:: 70.5 — ABNORMAL HIGH

## 2017-06-08 LAB — AMMONIA: Ammonia:SCnc:Pt:Plas:Qn:: <9 — ABNORMAL LOW

## 2017-06-08 LAB — DIGOXIN LEVEL: Digoxin:MCnc:Pt:Ser/Plas:Qn:: 0.7 — ABNORMAL LOW

## 2017-06-08 NOTE — Unmapped (Signed)
Patient rounds completed. The following patient needs were addressed:  Pain, Toileting, Personal Belongings, Plan of Care, Call Bell in Reach and Bed Position Low .

## 2017-06-08 NOTE — Unmapped (Signed)
Pt vomited an estimated 300 ccs of tan emesis while in clinic.

## 2017-06-08 NOTE — Unmapped (Signed)
Patient transported to CT Scan  Transported by Radiology  How tranported Stretcher  Cardiac Monitor yes

## 2017-06-08 NOTE — Unmapped (Signed)
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 Patient discharged from hospital 1 week ago. After d/c patient fell, slipped in bathroom, reports hitting his head, no LOC or confusion. Patient seen in ID clinic today and instructed to come to ED for concern for recurrent hemothorax.  In ED, patient tachycardic but VSS.  Patient with decreased breath sounds on right. CXR with R pleural effusion. Patient requiring 2-4L O2.  CTH without acute abnormalities.     S/p BCx x2, type and screen, sputum culture    Spoke with ID clinic who requested CT abd/pelvis.     Portable CXR sm R pleural effusion, new from prior, likely basilar atelectasis, no pneumothorax, CTH without acute intracranial abnormalities, CT abd/pelvis mod pleural effusion, mod intrahepatic ductal dilatation.CBC wnl except Hg 10.3, CMP Na 131, BUN 40, Cr 1.53 (baseline), glu 199, Alb 3.3, protein 5.8, t bili 3.0, ALT 250, alk phos 485. ESR 82, CRP 70.5, EKG Sinus tachycardia, nonspecific t wave abnormality compared to 05/26/17 premature atrial beats no longer present, nonspecific T wave now evident inferior leads and anterior leads, QT lengthened, VBG HCO3 28, lactate 1.9, ammonia wnl, PT/INR: 19.1/1.68, APTT wnl.     Patient to be admitted for hypoxia, chest pain, SOB. MAO notified. MDU to admit    Additional Medical Decision Making     I have reviewed the vital signs and the nursing notes. Labs and radiology results that were available during my care of the patient were independently reviewed by me and considered in my medical decision making.     I staffed the case with the ED attending, Dr. Myrtis Ser.    I independently visualized the EKG tracing.   I independently visualized the radiology images.   I reviewed the patient's prior medical records.   I discussed the case with the admitting provider.     Portions of this record have been created using Scientist, clinical (histocompatibility and immunogenetics). Dictation errors have been sought, but may not have been identified and corrected.  ____________________________________________       History     Chief Complaint  Chest Pain      HPI   Jeffrey Ward is a 67 y.o. 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  Patient was seen in infectious disease clinic today.  They instructed him to come here as they were concerned for recurrent hemothorax.  Patient was hospitalized a week ago for hemothorax.  He was discharged on Sunday and fell and slipped in his bathroom.  Reported hitting his head.  No loss of consciousness.  He has not been confused.  Since the fall and being discharged he has had shortness of breath, chest pain on the right side, vomiting.  This morning he vomited bright red blood.  He has not been tolerating oral intake as he vomits anything he tries to eat.  He has not had any fevers.  He  has had chills.  He has not been on any antibiotics since discharge.  He just finished steroid taper yesterday.    Patient endorses vomit, bright red blood, shortness of breath, chest pain, decreased appetite, abdominal pain, chills  Patient denies headache, dizziness, cough, lower extremity edema, changes in gait, neck pain or stiffness, back pain.        Past Medical History:   Diagnosis Date   ??? Cancer (CMS-HCC)    ??? Cirrhosis (CMS-HCC)    ??? Diabetes (CMS-HCC)    ??? Hepatitis C 07/17/2012   ??? Liver disease    ??? Low back pain 07/17/2012   ??? Varices, esophageal (CMS-HCC) Patient Active Problem List   Diagnosis   ??? Hepatitis C   ??? Low back pain   ??? Hematuria   ??? Calculus of ureter   ??? Anemia   ??? Benign prostatic hyperplasia   ??? Chronic pain disorder   ??? Dysrhythmia   ??? History of gastroesophageal reflux (GERD)   ??? History of hepatitis C virus infection   ??? History of substance abuse   ??? Hyperglycemia, unspecified   ??? Intermittent vertigo   ??? Microscopic hematuria   ??? Postlaminectomy syndrome, lumbar region   ??? Thrombocytopenia (CMS-HCC)   ??? Vitamin D deficiency   ??? Atrial fibrillation with RVR (CMS-HCC)   ??? Secondary esophageal varices (CMS-HCC)   ??? Right ventricular dilation   ??? End-stage liver disease (CMS-HCC)   ??? HCC (hepatocellular carcinoma) (CMS-HCC)   ??? Paroxysmal atrial fibrillation (CMS-HCC)   ??? Hepatic encephalopathy (CMS-HCC)   ??? S/P liver transplant (CMS-HCC)   ??? Pulmonary hypertension, moderate to severe (CMS-HCC)   ??? Acute right heart failure (CMS-HCC)   ??? Tracheostomy dependent (CMS-HCC)   ??? Mixed level of activity delirium due to multiple etiologies, resolved   ??? Insomnia   ??? Peripheral neuropathy   ??? Influenza A   ??? Hemothorax   ??? Gastrostomy tube in place (CMS-HCC)   ??? H/O tracheostomy   ??? S/P placement of cardiac pacemaker   ??? Uncontrolled type 2 diabetes mellitus without complication, with long-term current use of insulin (CMS-HCC)       Past Surgical History:   Procedure Laterality Date   ??? ANKLE SURGERY     ??? BACK SURGERY     ??? BACK SURGERY     ??? CHG US GUIDE, TISSUE ABLATION N/A 01/22/2016    Procedure: ULTRASOUND GUIDANCE FOR, AND MONITORING OF, PARENCHYMAL TISSUE ABLATION;  Surgeon: Particia Nearing, MD;  Location: MAIN OR [facility];  Service: Transplant   ??? IR EMBOLIZATION ORGAN ISCHEMIA, TUMORS, INFAR  06/16/2016    IR EMBOLIZATION ORGAN ISCHEMIA, TUMORS, INFAR 06/16/2016 Ammie Dalton, MD IMG VIR H&V [facility]   ??? PR COLON CA SCRN NOT HI RSK IND N/A 02/27/2015    Procedure: COLOREC CNCR SCR;COLNSCPY NO;  Surgeon: Vonda Antigua, MD;  Location: GI PROCEDURES [facility]IAL [facility];  Service: Gastroenterology   ??? PR ENDOSCOPIC ULTRASOUND EXAM N/A 02/27/2015    Procedure: UGI ENDO; W/ENDO ULTRASOUND EXAM INCLUDES ESOPHAGUS, STOMACH, &/OR DUODENUM/JEJUNUM;  Surgeon: Vonda Antigua, MD;  Location: GI PROCEDURES MEMORIAL [facility];  Service: Gastroenterology   ??? PR ERCP BALLOON DILATE BILIARY/PANC DUCT/AMPULLA EA N/A 02/10/2017    Procedure: ERCP;WITH TRANS-ENDOSCOPIC BALLOON DILATION OF BILIARY/PANCREATIC DUCT(S) OR OF AMPULLA, INCLUDING SPHINCTERECTOMY, WHEN PERFOREMD,EACH DUCT (16109);  Surgeon: Mayford Knife, MD;  Location: GI PROCEDURES MEMORIAL [facility];  Service: Gastroenterology   ??? PR ERCP REMOVE FOREIGN BODY/STENT BILIARY/PANC DUCT N/A 02/10/2017  Procedure: ENDOSCOPIC RETROGRADE CHOLANGIOPANCREATOGRAPHY (ERCP); W/ REMOVAL OF FOREIGN BODY/STENT FROM BILIARY/PANCREATIC DUCT(S);  Surgeon: Mayford Knife, MD;  Location: GI PROCEDURES MEMORIAL [facility];  Service: Gastroenterology   ??? PR ERCP STENT PLACEMENT BILIARY/PANCREATIC DUCT N/A 11/29/2016    Procedure: ENDOSCOPIC RETROGRADE CHOLANGIOPANCREATOGRAPHY (ERCP); WITH PLACEMENT OF ENDOSCOPIC STENT INTO BILIARY OR PANCREATIC DUCT;  Surgeon: Chriss Driver, MD;  Location: GI PROCEDURES MEMORIAL [facility];  Service: Gastroenterology   ??? PR ERCP STENT PLACEMENT BILIARY/PANCREATIC DUCT N/A 04/26/2017    Procedure: ENDOSCOPIC RETROGRADE CHOLANGIOPANCREATOGRAPHY (ERCP); WITH PLACEMENT OF ENDOSCOPIC STENT INTO BILIARY OR PANCREATIC DUCT;  Surgeon: Mayford Knife, MD;  Location: GI PROCEDURES MEMORIAL [facility];  Service: Gastroenterology   ??? PR ERCP,W/REMOVAL STONE,BIL/PANCR DUCTS N/A 11/29/2016    Procedure: ERCP; W/ENDOSCOPIC RETROGRADE REMOVAL OF CALCULUS/CALCULI FROM BILIARY &/OR PANCREATIC DUCTS;  Surgeon: Chriss Driver, MD;  Location: GI PROCEDURES MEMOR[facility];  Service: Gastroenterology   ??? PR ERCP,W/REMOVAL STONE,BIL/PANCR DUCTS N/A 02/10/2017    Procedure: ERCP; W/ENDOSCOPIC RETROGRADE REMOVAL OF CALCULUS/CALCULI FROM BILIARY &/OR PANCREATIC DUCTS;  Surgeon: Mayford Knife, MD;  Location: GI PROCEDURES MEMORIAL [facility];  Service: Gastroenterology   ??? PR ERCP,W/REMOVAL STONE,BIL/PANCR DUCTS N/A 04/26/2017    Procedure: ERCP; W/ENDOSCOPIC RETROGRADE REMOVAL OF CALCULUS/CALCULI FROM BILIARY &/OR PANCREATIC DUCTS;  Surgeon: Mayford Knife, MD;  Location: GI PROCEDURES MEMOR[facility];  Service: Gastroenterology   ??? PR INSER HEART TEMP PACER ONE CHMBR N/A 10/02/2016    Procedure: Tempoarary Pacemaker Insertion;  Surgeon: Meredith Leeds, MD;  Location: [facility];  Service: Cardiology   ??? PR LAP,DIAGNOSTIC ABDOMEN N/A 01/22/2016    Procedure: Laparoscopy, Abdomen, Peritoneum, & Omentum, Diagnostic, W/Wo Collection Specimen(S) By Brushing Or Washing;  Surgeon: Particia Nearing, MD;  Location: MAIN OR [facility];  Service: Transplant   ??? PR PLACE PERCUT GASTROSTOMY TUBE N/A 11/17/2016    Procedure: UGI ENDO; W/DIRECTED PLCMT PERQ GASTROSTOMY TUBE;  Surgeon: Cletis Athens, MD;  Location: GI PROCEDURES MEMORIAL [facility];  Service: Gastroenterology   ??? PR TRACHEOSTOMY, PLANNED N/A 09/29/2016    Procedure: TRACHEOSTOMY PLANNED (SEPART PROC);  Surgeon: Katherina Mires, MD;  Location: MAIN OR [facility];  Service: Trauma   ??? PR TRANSCATH INSERT OR REPLACE LEADLESS PM VENTR N/A 10/11/2016    Procedure: Pacemaker Implant/Replace Leadless;  Surgeon: Meredith Leeds, MD;  Location: [facility];  Service: Cardiology   ??? PR TRANSPLANT LIVER,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    Procedure: Liver Allotransplantation; Orthotopic, Partial Or Whole, From Cadaver Or Living Donor, Any Age;  Surgeon: Doyce Loose, MD;  Location: MAIN OR [facility];  Service: Transplant   ??? PR TRANSPLANT,PREP DONOR LIVER, WHOLE N/A 09/15/2016    Procedure: Rogelia Boga Std Prep Cad Donor Whole Liver Gft Prior Tnsplnt,Inc Chole,Diss/Rem Surr Tissu Wo Triseg/Lobe Splt;  Surgeon: Doyce Loose, MD;  Location: MAIN OR [facility];  Service: Transplant   ??? PR UPPER GI ENDOSCOPY,BIOPSY N/A 07/17/2012    Procedure: UGI ENDOSCOPY; WITH BIOPSY, SINGLE OR MULTIPLE;  Surgeon: Alba Destine, MD;  Location: GI PROCEDURES MEMORIAL [facility];  Service: Gastroenterology   ??? PR UPPER GI ENDOSCOPY,DIAGNOSIS N/A 02/04/2014    Procedure: UGI ENDO, INCLUDE ESOPHAGUS, STOMACH, & DUODENUM &/OR JEJUNUM; DX W/WO COLLECTION SPECIMN, BY BRUSH OR WASH;  Surgeon: Wilburt Finlay, MD;  Location: GI PROCEDURES MEMORIAL [facility];  Service: Gastroenterology   ??? PR UPPER GI ENDOSCOPY,LIGAT VARIX N/A 11/05/2013    Procedure: UGI ENDO; W/BAND LIG ESOPH &/OR GASTRIC VARICES;  Surgeon: Wilburt Finlay, MD;  Location: GI PROCEDURES [facility]IAL [facility];  Service: Gastroenterology       No current facility-administered medications for this encounter.     Current Outpatient Prescriptions:   ???  acetaminophen (TYLENOL) 325 MG tablet, Take 1 tablet (325 mg total) by mouth every four (4) hours as needed for pain., Disp: 100 tablet, Rfl: 2  ???  blood sugar diagnostic (FREESTYLE TEST) Strp, by Other route Four (4) times a day (before meals and nightly)., Disp: 100 each, Rfl: 11  ???  blood-glucose meter (GLUCOSE MONITORING KIT) kit, Use as instructed, Disp: 1 each, Rfl: 0  ???  cholecalciferol, vitamin D3, 2,000 unit cap, 1 capsule (2,000 Units total) by PEG Tube route daily. (Patient taking differently: Take 1 capsule by mouth daily.), Disp: 30 capsule, Rfl: 11  ???  digoxin (LANOXIN) 125 mcg tablet, 0.5 tablets (62.5 mcg total) by G-tube route daily. (Patient taking differently: Take 0.5 tablets by mouth daily.), Disp: 15 tablet, Rfl: 11  ???  insulin ASPART (NOVOLOG FLEXPEN U-100 INSULIN) 100 unit/mL injection pen, Inject 0.06 mL (6 Units total) under the skin Three (3) times a day before meals., Disp: 4 mL, Rfl: 11  ???  insulin ASPART (NOVOLOG FLEXPEN) 100 unit/mL injection pen, Additional mealtime per SSI-  151-200= 2 units,  201-250= 4 units, 251-300=add 6 units, 301-350=8 units, 351-400=10 units, >400 =12 units, Disp: 1.8 mL, Rfl: 0  ???  insulin glargine (LANTUS) 100 unit/mL (3 mL) injection pen, Inject 0.12 mL (12 Units total) under the skin nightly., Disp: 4 mL, Rfl: 11  ???  lancets Misc, 1 each by Miscellaneous route Four (4) times a day (before meals and nightly)., Disp: 100 each, Rfl: 11  ???  MAGNESIUM, AMINO ACID CHELATE, 133 mg tablet, TAKE 1 TABLET BY MOUTH TWICE DAILY, Disp: 100 tablet, Rfl: PRN  ???  melatonin 3 mg Tab, Take 1 tablet (3 mg total) by mouth nightly., Disp: 30 tablet, Rfl: 0  ???  methylPREDNISolone (MEDROL DOSEPACK) 4 mg tablet, follow package directions. Dispense: 1 pack, Disp: 1 tablet, Rfl: 0  ???  mirtazapine (REMERON) 15 MG tablet, Take 1 tablet (15 mg total) by mouth nightly., Disp: 30 tablet, Rfl: 3  ???  mycophenolate (CELLCEPT) 250 mg capsule, Take 2 capsules (500 mg total) by mouth Two (2) times a day., Disp: 360 capsule, Rfl: 3  ???  NEORAL 100 mg capsule, 175 mg BID (three 25 mg capsules and one 100 mg capsule two times a day), Disp: 60 capsule, Rfl: 11  ???  NEORAL 25 mg capsule, 175 mg BID (three 25 mg capsules and one 100 mg capsule two times a day), Disp: 180 capsule, Rfl: 11  ???  omeprazole (PRILOSEC) 20 MG capsule, Take 1 capsule (20 mg total) by mouth daily., Disp: 30 capsule, Rfl: 1  ???  pen needle, diabetic (ULTICARE PEN NEEDLE) 32 gauge x 5/32 Ndle, 1 each by Miscellaneous route Four (4) times a day (before meals and nightly)., Disp: 90 each, Rfl: 3  ???  sildenafil, antihypertensive, (REVATIO) 20 mg tablet, Take 40 mg by mouth Three (3) times a day., Disp: , Rfl:   ???  traMADol (ULTRAM) 50 mg tablet, Take 1 tablet (50 mg total) by mouth every six (6) hours as needed for pain., Disp: 30 tablet, Rfl: 0  ???  warfarin (COUMADIN) 3 MG tablet, Take 3 mg by mouth nightly. , Disp: , Rfl:     Allergies  Patient has no known allergies.    Family History   Problem Relation Age of Onset   ???  Hypertension Mother    ??? Cirrhosis Neg Hx    ??? Liver cancer Neg Hx    ??? Anemia Neg Hx    ??? Cancer Neg Hx    ??? Diabetes Neg Hx    ??? Kidney disease Neg Hx    ??? Obesity Neg Hx    ??? Thyroid disease Neg Hx    ??? Osteoporosis Neg Hx    ??? Coronary artery disease Neg Hx    ??? Anesthesia problems Neg Hx        Social History  Social History   Substance Use Topics   ??? Smoking status: Never Smoker   ??? Smokeless tobacco: Never Used      Comment: Smoked in high school for about 5 years.    ??? Alcohol use No       Review of Systems  Constitutional: Negative for fever +chills  Eyes: Negative for visual changes.  ENT: Negative for sore throat.  Cardiovascular: +chest pain, no palpiations  Respiratory: +shortness of breath, no cough  Gastrointestinal: + abdominal pain,+ vomiting, no diarrhea. No blood in stool  Genitourinary: Negative for dysuria or hematuria  Musculoskeletal: Negative for back pain.  Skin: Negative for rash.  Neurological: Negative for headaches, focal weakness or numbness.      Physical Exam     ED Triage Vitals [06/08/17 1141]   Enc Vitals Group      BP 111/95      Heart Rate 110      SpO2 Pulse       Resp 20      Temp 36.3 ??C (97.3 ??F)      Temp Source Oral      SpO2 99 %      Weight 59 kg (130 lb)      Height 1.702 m (5' 7)      Head Circumference       Peak Flow       Pain Score       Pain Loc       Pain Edu?       Excl. in GC?      Constitutional: Alert and oriented. Chronically ill appearing, NAD.  Eyes: Conjunctivae are normal.  ENT       Head: Normocephalic and atraumatic.       Nose: No congestion.       Mouth/Throat: Mucous membranes are moist.       Neck: No stridor.  Hematological/Lymphatic/Immunilogical: No cervical lymphadenopathy.  Cardiovascular: Normal rate, regular rhythm. Normal and symmetric distal pulses are present in all extremities.  Respiratory: Normal respiratory effort. Clear breath sounds left, decreased breath sounds R  Gastrointestinal: Soft and RUQ tenderness, no rebound or guarding.   Musculoskeletal: Normal range of motion in all extremities.       Right lower leg: No tenderness or edema.       Left lower leg: No tenderness or edema.  Neurologic: Normal speech and language. No gross focal neurologic deficits are appreciated. CN's grossly intact, A&Ox3  Skin: Skin is warm, dry and intact. No rash noted.  Psychiatric: Mood and affect are normal. Speech and behavior are normal.      EKG   Sinus tachycardia, nonspecific t wave abnormality compared to 05/26/17 premature atrial beats no longer present, nonspecific T wave now evident inferior leads and anterior leads, QT lengthened    Radiology   CXR Electronic device projecting over the sternum is likely a loop recorder.Normal heart sizeThe lungs are well-inflated.There is a  small right pleural effusion, new from prior. Likely right basilar atelectasis.No pneumothorax.    CTH no acute intracranial process, Nonacute left caudate tail lacunar infarct, new from 09/20/2016.    CT abd/pelvis   -Interval mild to moderate decrease in size of hepatic abscesses.  -Moderate-sized pleural effusion with simple fluid density which is stable in size compared to prior chest CT.  -Decreased pneumobilia and moderate intrahepatic ductal dilatation.      Procedures   N/a           Galvin Proffer, MD  Resident  06/08/17 [phone]

## 2017-06-08 NOTE — Unmapped (Signed)
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  He represents to the ER 5 days after a likely mechanical fall at home and was found to have a right sided pleural effusion w/ continued 3 liver lesions concerning for abscess versus granulomas.  He has been afebrile, but his liver enzymes and bilirubin have been elevated.  There was also a possible finding of CVA on his head CT.    - Admit to [facility]  - ID c/s to discuss drainage versus watchful waiting of pleural effusion/liver lesions  - Neuro c/s if attending overread agrees w/ CVA finding  - MIVF at 50  - regular diet, NPO at midnight  - heparin subq  - holding warfarin for now  - cont immunosuppressants  - floor status      History of Present Illness:      Chief Complaint: fall  ??  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  The patient was admitted until 05/28/17 when prior to d/c he had his chest tube removed.  The patient's biopsy was significant for patchy noncaseating granulomatous inflammation, dense chronic inflammation and focal extensive fibrosis, consistent with clinical history of hepatic abscess.  The patient had a fall in the bathroom 5 days ago, and has been having lower right sided chest pain since.  He then presented to infectious disease clinic today where he was sent to the ER for chest pain, tachycardia, and feeling poorly w/ cough.       Allergies    Patient has no known allergies.      Medications      No current facility-administered medications for this encounter.      Current Outpatient Prescriptions   Medication Sig Dispense Refill   ??? acetaminophen (TYLENOL) 325 MG tablet Take 1 tablet (325 mg total) by mouth every four (4) hours as needed for pain. 100 tablet 2   ??? blood sugar diagnostic (FREESTYLE TEST) Strp by Other route Four (4) times a day (before meals and nightly). 100 each 11   ??? blood-glucose meter (GLUCOSE MONITORING KIT) kit Use as instructed 1 each 0   ??? cholecalciferol, vitamin D3, 2,000 unit cap 1 capsule (2,000 Units total) by PEG Tube route daily. (Patient taking differently: Take 1 capsule by mouth daily.) 30 capsule 11   ??? digoxin (LANOXIN) 125 mcg tablet 0.5 tablets (62.5 mcg total) by G-tube route daily. (Patient taking differently: Take 0.5 tablets by mouth daily.) 15 tablet 11   ??? insulin ASPART (NOVOLOG FLEXPEN U-100 INSULIN) 100 unit/mL injection pen Inject 0.06 mL (6 Units total) under the skin Three (3) times a day before meals. 4 mL 11   ??? insulin ASPART (NOVOLOG FLEXPEN) 100 unit/mL injection pen Additional mealtime per SSI-  151-200= 2 units,  201-250= 4 units,  251-300=add 6 units, 301-350=8 units, 351-400=10 units, >400 =12 units 1.8 mL 0   ??? insulin glargine (LANTUS) 100 unit/mL (3 mL) injection pen Inject 0.12 mL (12 Units total) under the skin nightly. 4 mL 11   ??? lancets Misc 1 each by Miscellaneous route Four (4) times a day (before meals and nightly). 100 each 11   ??? MAGNESIUM, AMINO ACID CHELATE, 133 mg tablet TAKE 1 TABLET BY MOUTH TWICE DAILY 100 tablet PRN   ??? melatonin 3 mg Tab Take 1 tablet (3 mg total) by mouth nightly. 30 tablet 0   ??? methylPREDNISolone (MEDROL DOSEPACK) 4 mg tablet follow package directions. Dispense: 1 pack 1 tablet 0   ??? mirtazapine (REMERON) 15 MG tablet Take 1 tablet (15 mg total) by mouth nightly. 30 tablet 3   ??? mycophenolate (CELLCEPT) 250 mg capsule Take 2 capsules (500 mg total) by mouth Two (2) times a day. 360 capsule 3   ??? NEORAL 100 mg capsule 175 mg BID (three 25 mg capsules and one 100 mg capsule two times a day) 60 capsule 11   ??? NEORAL 25 mg capsule 175 mg BID (three 25 mg capsules and one 100 mg capsule two times a day) 180 capsule 11   ??? omeprazole (PRILOSEC) 20 MG capsule Take 1 capsule (20 mg total) by mouth daily. 30 capsule 1   ??? pen needle, diabetic (ULTICARE PEN NEEDLE) 32 gauge x 5/32 Ndle 1 each by Miscellaneous route Four (4) times a day (before meals and nightly). 90 each 3   ??? sildenafil, antihypertensive, (REVATIO) 20 mg tablet Take 40 mg by mouth Three (3) times a day.     ??? traMADol (ULTRAM) 50 mg tablet Take 1 tablet (50 mg total) by mouth every six (6) hours as needed for pain. 30 tablet 0   ??? warfarin (COUMADIN) 3 MG tablet Take 3 mg by mouth nightly.            Past Medical History    Past Medical History:   Diagnosis Date   ??? Cancer (CMS-HCC)    ??? Cirrhosis (CMS-HCC)    ??? Diabetes (CMS-HCC)    ??? Hepatitis C 07/17/2012   ??? Liver disease    ??? Low back pain 07/17/2012   ??? Varices, esophageal (CMS-HCC)          Past Surgical History    Past Surgical History:   Procedure Laterality Date   ??? ANKLE SURGERY     ??? BACK SURGERY     ??? BACK SURGERY     ??? CHG US GUIDE, TISSUE ABLATION N/A 01/22/2016    Procedure: ULTRASOUND GUIDANCE FOR, AND MONITORING OF, PARENCHYMAL TISSUE ABLATION;  Surgeon: Particia Nearing, MD;  Location: MAIN OR [facility];  Service: Transplant   ??? IR EMBOLIZATION ORGAN ISCHEMIA, TUMORS, INFAR  06/16/2016    IR EMBOLIZATION ORGAN ISCHEMIA, TUMORS, INFAR 06/16/2016 Ammie Dalton, MD IMG VIR [facility]   ??? PR COLON CA SCRN NOT HI RSK IND N/A 02/27/2015    Procedure: COLOREC CNCR SCR;COLNSCPY NO;  Surgeon: Vonda Antigua, MD;  Location: [facility] PROC[facility]URES [facility];  Service: Gastroenterology   ??? PR ENDOSCOPIC ULTRASOUND EXAM N/A 02/27/2015    Procedure: UGI ENDO; W/ENDO ULTRASOUND EXAM INCLUDES ESOPHAGUS, STOMACH, &/OR DUODENUM/JEJUNUM;  Surgeon: Vonda Antigua, MD;  Location: GI PROCEDURES [facility];  Service: Gastroenterology   ??? PR ERCP BALLOON DILATE BILIARY/PANC DUCT/AMPULLA EA N/A 02/10/2017    Procedure: ERCP;WITH TRANS-ENDOSCOPIC BALLOON DILATION OF BILIARY/PANCREATIC DUCT(S) OR  OF AMPULLA, INCLUDING SPHINCTERECTOMY, WHEN PERFOREMD,EACH DUCT (96045);  Surgeon: Mayford Knife, MD;  Location: GI PROCEDURES [facility];  Service: Gastroenterology   ??? PR ERCP REMOVE FOREIGN BODY/STENT BILIARY/PANC DUCT N/A 02/10/2017    Procedure: ENDOSCOPIC RETROGRADE CHOLANGIOPANCREATOGRAPHY (ERCP); W/ REMOVAL OF FOREIGN BODY/STENT FROM BILIARY/PANCREATIC DUCT(S);  Surgeon: Mayford Knife, MD;  Location: GI PROCEDURES [facility];  Service: Gastroenterology   ??? PR ERCP STENT PLACEMENT BILIARY/PANCREATIC DUCT N/A 11/29/2016    Procedure: ENDOSCOPIC RETROGRADE CHOLANGIOPANCREATOGRAPHY (ERCP); WITH PLACEMENT OF ENDOSCOPIC STENT INTO BILIARY OR PANCREATIC DUCT;  Surgeon: Chriss Driver, MD;  Location: GI PROCEDURES [facility];  Service: Gastroenterology   ??? PR ERCP STENT PLACEMENT BILIARY/PANCREATIC DUCT N/A 04/26/2017    Procedure: ENDOSCOPIC RETROGRADE CHOLANGIOPANCREATOGRAPHY (ERCP); WITH PLACEMENT OF ENDOSCOPIC STENT INTO BILIARY OR PANCREATIC DUCT;  Surgeon: Mayford Knife, MD;  Location: GI PROCEDURES [facility];  Service: Gastroenterology   ??? PR ERCP,W/REMOVAL STONE,BIL/PANCR DUCTS N/A 11/29/2016    Procedure: ERCP; W/ENDOSCOPIC RETROGRADE REMOVAL OF CALCULUS/CALCULI FROM BILIARY &/OR PANCREATIC DUCTS;  Surgeon: Chriss Driver, MD;  Location: GI PROCEDURES [facility];  Service: Gastroenterology   ??? PR ERCP,W/REMOVAL STONE,BIL/PANCR DUCTS N/A 02/10/2017    Procedure: ERCP; W/ENDOSCOPIC RETROGRADE REMOVAL OF CALCULUS/CALCULI FROM BILIARY &/OR PANCREATIC DUCTS;  Surgeon: Mayford Knife, MD;  Location: GI PROCEDURES MEMORIAL [facility];  Service: Gastroenterology   ??? PR ERCP,W/REMOVAL STONE,BIL/PANCR DUCTS N/A 04/26/2017    Procedure: ERCP; W/ENDOSCOPIC RETROGRADE REMOVAL OF CALCULUS/CALCULI FROM BILIARY &/OR PANCREATIC DUCTS;  Surgeon: Mayford Knife, MD;  Location: GI PROCEDURES [facility];  Service: Gastroenterology   ??? PR INSER HEART TEMP PACER ONE CHMBR N/A 10/02/2016    Procedure: Tempoarary Pacemaker Insertion;  Surgeon: Meredith Leeds, MD;  Location: [facility];  Service: Cardiology   ??? PR LAP,DIAGNOSTIC ABDOMEN N/A 01/22/2016    Procedure: Laparoscopy, Abdomen, Peritoneum, & Omentum, Diagnostic, W/Wo Collection Specimen(S) By Brushing Or Washing;  Surgeon: Particia Nearing, MD;  Location: MAIN OR [facility];  Service: Transplant   ??? PR PLACE PERCUT GASTROSTOMY TUBE N/A 11/17/2016    Procedure: UGI ENDO; W/DIRECTED PLCMT PERQ GASTROSTOMY TUBE;  Surgeon: Cletis Athens, MD;  Location: GI PROCEDURES MEMORIAL [facility];  Service: Gastroenterology   ??? PR TRACHEOSTOMY, PLANNED N/A 09/29/2016    Procedure: TRACHEOSTOMY PLANNED (SEPART PROC);  Surgeon: Katherina Mires, MD;  Location: MAIN OR [facility];  Service: Trauma   ??? PR TRANSCATH INSERT OR REPLACE LEADLESS PM VENTR N/A 10/11/2016    Procedure: Pacemaker Implant/Replace Leadless;  Surgeon: Meredith Leeds, MD;  Location: [facility];  Service: Cardiology   ??? PR TRANSPLANT LIVER,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    Procedure: Liver Allotransplantation; Orthotopic, Partial Or Whole, From Cadaver Or Living Donor, Any Age;  Surgeon: Doyce Loose, MD;  Location: MAIN OR [facility];  Service: Transplant   ??? PR TRANSPLANT,PREP DONOR LIVER, WHOLE N/A 09/15/2016    Procedure: Rogelia Boga Std Prep Cad Donor Whole Liver Gft Prior Tnsplnt,Inc Chole,Diss/Rem Surr Tissu Wo Triseg/Lobe Splt;  Surgeon: Doyce Loose, MD;  Location: MAIN OR [facility];  Service: Transplant   ??? PR UPPER GI ENDOSCOPY,BIOPSY N/A 07/17/2012    Procedure: UGI ENDOSCOPY; WITH BIOPSY, SINGLE OR MULTIPLE;  Surgeon: Alba Destine, MD; Location: GI PROCEDURES MEMORIAL [facility];  Service: Gastroenterology   ??? PR UPPER GI ENDOSCOPY,DIAGNOSIS N/A 02/04/2014    Procedure: UGI ENDO, INCLUDE ESOPHAGUS, STOMACH, & DUODENUM &/OR JEJUNUM; DX W/WO COLLECTION SPECIMN, BY BRUSH OR WASH;  Surgeon: Wilburt Finlay, MD;  Location: GI PROCEDURES MEMORIAL [place];  Service: Gastroenterology   ??? PR UPPER GI ENDOSCOPY,LIGAT VARIX N/A 11/05/2013    Procedure: UGI ENDO; W/BAND LIG ESOPH &/OR GASTRIC VARICES;  Surgeon: Wilburt Finlay, MD;  Location: GI PROCEDURES [facility];  Service: Gastroenterology         Family History    The patient's family history includes Hypertension in his mother..      Social History:  Social History   Substance Use Topics   ??? Smoking status: Never Smoker   ??? Smokeless tobacco: Never Used      Comment: Smoked in high school for about 5 years.    ??? Alcohol use No       Review of Systems  Review of Systems   All other systems reviewed and are negative.      Objective:       Vital Signs    Patient Vitals for the past 8 hrs:   BP Temp Temp src Pulse SpO2 Pulse Resp SpO2 Height Weight   06/08/17 1500 132/85 - - 90 90 21 100 % - -   06/08/17 1400 131/82 - - - - - - - -   06/08/17 1300 141/73 - - 107 106 23 100 % - -   06/08/17 1209 148/68 - - 109 103 (!) 34 94 % - -   06/08/17 1141 111/95 36.3 ??C Oral 110 - 20 99 % 170.2 cm (5' 7) 59 kg (130 lb)     No intake/output data recorded.      Physical Exam  Physical Exam   Constitutional: He is oriented to person, place, and time. No distress.   HENT:   Head: Normocephalic.   Eyes: Pupils are equal, round, and reactive to light. EOM are normal.   Neck: No tracheal deviation present.   Cardiovascular: Normal rate and regular rhythm.    Pulmonary/Chest: No stridor. No respiratory distress. He exhibits tenderness.   Right lower and lateral chest tender to palpation   Abdominal: Soft. There is no tenderness. There is no rebound and no guarding.   Musculoskeletal: Normal range of motion. He exhibits no edema. Neurological: He is alert and oriented to person, place, and time.   Skin: Skin is warm and dry.   Psychiatric: He has a normal mood and affect. His behavior is normal.       Test Results    All lab results last 24 hours:    Recent Results (from the past 24 hour(s))   ECG 12 lead (Adult)    Collection Time: 06/08/17 12:05 PM   Result Value Ref Range    EKG Systolic BP  mmHg    EKG Diastolic BP  mmHg    EKG Ventricular Rate 108 BPM    EKG Atrial Rate 108 BPM    EKG P-R Interval 140 ms    EKG QRS Duration 76 ms    EKG Q-T Interval 362 ms    EKG QTC Calculation 485 ms    EKG Calculated P Axis 74 degrees    EKG Calculated R Axis 36 degrees    EKG Calculated T Axis 54 degrees   Comprehensive Metabolic Panel    Collection Time: 06/08/17 12:49 PM   Result Value Ref Range    Sodium 131 (L) 135 - 145 mmol/L    Potassium  3.5 - 5.0 mmol/L    Chloride 100 98 - 107 mmol/L    CO2 25.0 22.0 - 30.0 mmol/L    BUN 40 (H) 7 - 21 mg/dL  Creatinine 1.53 (H) 0.70 - 1.30 mg/dL    BUN/Creatinine Ratio 26     EGFR MDRD Non Af Amer 45 (L) >=60 mL/min/1.53m2    EGFR MDRD Af Amer 55 (L) >=60 mL/min/1.81m2    Anion Gap 6 (L) 9 - 15 mmol/L    Glucose 199 (H) 65 - 179 mg/dL    Calcium 9.5 8.5 - 16.1 mg/dL    Albumin 3.3 (L) 3.5 - 5.0 g/dL    Total Protein 5.8 (L) 6.5 - 8.3 g/dL    Total Bilirubin 3.0 (H) 0.0 - 1.2 mg/dL    AST  19 - 55 U/L    ALT 250 (H) 19 - 72 U/L    Alkaline Phosphatase 485 (H) 38 - 126 U/L   Type and Screen    Collection Time: 06/08/17 12:49 PM   Result Value Ref Range    Antibody Screen NEG     ABO Grouping O NEG    Sedimentation rate, manual    Collection Time: 06/08/17 12:49 PM   Result Value Ref Range    Sed Rate 82 (H) 0 - 20 mm/h   C-reactive protein    Collection Time: 06/08/17 12:49 PM   Result Value Ref Range    CRP 70.5 (H) <10.0 mg/L   CBC w/ Differential    Collection Time: 06/08/17 12:49 PM   Result Value Ref Range    WBC 5.1 4.5 - 11.0 10*9/L    RBC 3.58 (L) 4.50 - 5.90 10*12/L    HGB 10.3 (L) 13.5 - 17.5 g/dL    HCT 09.6 (L) 04.5 - 53.0 %    MCV 90.6 80.0 - 100.0 fL    MCH 28.8 26.0 - 34.0 pg    MCHC 31.8 31.0 - 37.0 g/dL    RDW 40.9 (H) 81.1 - 15.0 %    MPV 8.5 7.0 - 10.0 fL    Platelet 177 150 - 440 10*9/L    Neutrophils % 90.9 %    Lymphocytes % 3.6 %    Monocytes % 4.7 %    Eosinophils % 0.3 %    Basophils % 0.1 %    Neutrophil Left Shift 1+ (A) Not Present    Absolute Neutrophils 4.6 2.0 - 7.5 10*9/L    Absolute Lymphocytes 0.2 (L) 1.5 - 5.0 10*9/L    Absolute Monocytes 0.2 0.2 - 0.8 10*9/L    Absolute Eosinophils 0.0 0.0 - 0.4 10*9/L    Absolute Basophils 0.0 0.0 - 0.1 10*9/L    Large Unstained Cells 0 0 - 4 %    Macrocytosis Slight (A) Not Present    Anisocytosis Slight (A) Not Present    Hypochromasia Marked (A) Not Present   Ammonia    Collection Time: 06/08/17 12:49 PM   Result Value Ref Range    Ammonia <9 (L) 9 - 33 umol/L   Protime-INR    Collection Time: 06/08/17 12:49 PM   Result Value Ref Range    PT 19.1 (H) 10.2 - 12.8 sec    INR 1.68    APTT    Collection Time: 06/08/17 12:49 PM   Result Value Ref Range    APTT 34.0 27.7 - 37.7 sec    Heparin Correlation 0.2    Morphology Review    Collection Time: 06/08/17 12:49 PM   Result Value Ref Range    Smear Review Comments See Comment (A) Undefined   Blood Gas, Venous    Collection Time: 06/08/17  1:09 PM  Result Value Ref Range    Specimen Source Venous     FIO2 Venous Not Specified     pH, Venous 7.38 7.32 - 7.43    pCO2, Ven 48 40 - 60 mm Hg    pO2, Ven 26 (L) 30 - 55 mm Hg    HCO3, Ven 28 (H) 22 - 27 mmol/L    Base Excess, Ven 3.2 (H) -2.0 - 2.0    O2 Saturation, Venous 41.1 40.0 - 85.0 %   Lactate, Venous, Whole Blood    Collection Time: 06/08/17  1:09 PM   Result Value Ref Range    Lactate, Venous 1.9 (H) 0.5 - 1.8 mmol/L       Imaging: Radiology studies were personally reviewed      Problem List    Active Problems:    * No active hospital problems. *

## 2017-06-08 NOTE — Unmapped (Signed)
[facility] Follow-Up Note    Assessment/Plan:   Jeffrey Ward is a 67 y.o. year old male   # 09/15/16 orthotopic liver transplantation for HCV cirrhosis and HCC  - complicated by unmasked portopulmonary hypertension and right heart failure s/p tracheostomy 09/29/16  - steroid induction  - tacro 4 bid, pred 2.5 daily, MMF 500 bid  - TMP/SMX, valgan PPX  ??  # 09/27/16 Septic shock of unclear etiology, suspicious for intra-abdominal source: treated with daptomycin, cefepime, metronidazole, micafungin 7/20 - 10/07/16. Nothing isolated on cultures. 09/21/16 abdominal u/s showed numerous perihepatic fluid collections but primary team declined repeat abdominal imaging.   - Blood cx neg, serum crypto neg, mini-BAL oral flora, UA negative  ??  # Temporary Pacemaker placed??with permanent wires for recurrent asystolic pauses 10/02/16  ??  # Somnolence and myoclonus 09/2016: possibly related to cefepime-induced neurotoxicity     # liver lesions of unclear origin February 2019  St/p liver biopsy complicated by hemothorax March 2019    Now presenting st/p fall with recurrent pleural effusion on exam, cough, severe R sided chest and RUQ pain, chills, and N/V    RECOMMENDATIONS:  Recommend readmission for evaluation of Right chest pain and r/o recurrent hemothorax  Discussed with ED    Recommend:  -blood cx x2  -Imaging per ED, could start with POCUS chest  -CBC & CMP  -ESR and CRP  -sputum culture if produces  -bartonella serology  -AFB blood culture  -fungal antibodies  -crypto Ag blood  -histo Ag urine and serum  -fungitell    He will need re-imaging of his liver as well. The inpatient ID team (Dr. Reynold Bowen aware) will evaluate him for further w/u based on repeat imaging.    [name]  Immunocompromised Host Infectious Diseases  Pager [phone]    Subjective:   Interval History:   He was recently discharged on 05/28/17. Unfortunately, he had a fall on 05/31/17. Fell in bathroom after slipping on the floor. He has severe chest pain. Pain is right sided, worse with breathing. He is also coughing, which is worse from before.   He is feeling cold with chills, but has not noted fevers.  He is nauseous and vomiting in clinic. This started today  He is not on any antibiotics currently.  He also feels pain over RUQ    medications  Current Outpatient Prescriptions   Medication Sig Dispense Refill   ??? acetaminophen (TYLENOL) 325 MG tablet Take 1 tablet (325 mg total) by mouth every four (4) hours as needed for pain. 100 tablet 2   ??? blood sugar diagnostic (FREESTYLE TEST) Strp by Other route Four (4) times a day (before meals and nightly). 100 each 11   ??? blood-glucose meter (GLUCOSE MONITORING KIT) kit Use as instructed 1 each 0   ??? cholecalciferol, vitamin D3, 2,000 unit cap 1 capsule (2,000 Units total) by PEG Tube route daily. (Patient taking differently: Take 1 capsule by mouth daily.) 30 capsule 11   ??? digoxin (LANOXIN) 125 mcg tablet 0.5 tablets (62.5 mcg total) by G-tube route daily. (Patient taking differently: Take 0.5 tablets by mouth daily.) 15 tablet 11   ??? insulin ASPART (NOVOLOG FLEXPEN U-100 INSULIN) 100 unit/mL injection pen Inject 0.06 mL (6 Units total) under the skin Three (3) times a day before meals. 4 mL 11   ??? insulin ASPART (NOVOLOG FLEXPEN) 100 unit/mL injection pen Additional mealtime per SSI-  151-200= 2 units,  201-250= 4 units, 251-300=add 6 units,  301-350=8 units, 351-400=10 units, >400 =12 units 1.8 mL 0   ??? insulin glargine (LANTUS) 100 unit/mL (3 mL) injection pen Inject 0.12 mL (12 Units total) under the skin nightly. 4 mL 11   ??? lancets Misc 1 each by Miscellaneous route Four (4) times a day (before meals and nightly). 100 each 11   ??? MAGNESIUM, AMINO ACID CHELATE, 133 mg tablet TAKE 1 TABLET BY MOUTH TWICE DAILY 100 tablet PRN   ??? melatonin 3 mg Tab Take 1 tablet (3 mg total) by mouth nightly. 30 tablet 0   ??? methylPREDNISolone (MEDROL DOSEPACK) 4 mg tablet follow package directions. Dispense: 1 pack 1 tablet 0   ??? mirtazapine (REMERON) 15 MG tablet Take 1 tablet (15 mg total) by mouth nightly. 30 tablet 3   ??? mycophenolate (CELLCEPT) 250 mg capsule Take 2 capsules (500 mg total) by mouth Two (2) times a day. 360 capsule 3   ??? NEORAL 100 mg capsule 175 mg BID (three 25 mg capsules and one 100 mg capsule two times a day) 60 capsule 11   ??? NEORAL 25 mg capsule 175 mg BID (three 25 mg capsules and one 100 mg capsule two times a day) 180 capsule 11   ??? omeprazole (PRILOSEC) 20 MG capsule Take 1 capsule (20 mg total) by mouth daily. 30 capsule 1   ??? pen needle, diabetic (ULTICARE PEN NEEDLE) 32 gauge x 5/32 Ndle 1 each by Miscellaneous route Four (4) times a day (before meals and nightly). 90 each 3   ??? sildenafil, antihypertensive, (REVATIO) 20 mg tablet Take 40 mg by mouth Three (3) times a day.     ??? traMADol (ULTRAM) 50 mg tablet Take 1 tablet (50 mg total) by mouth every six (6) hours as needed for pain. 30 tablet 0   ??? warfarin (COUMADIN) 3 MG tablet Take 3 mg by mouth nightly.        No current facility-administered medications for this visit.      Objective:  Physical Exam:  BP 120/71  - Pulse 104  - Temp 36.8 ??C (98.2 ??F)  - Ht 172.7 cm (5' 7.99)  - Wt 58.1 kg (128 lb)  - BMI 19.47 kg/m??   Limited exam. Patient vomiting and very uncomfortable  R sided dullness to percussion at base  R sided pain over posterior ribs.   Severe R sided pain over anterior chest and RUQ    LABS:  Lab Results   Component Value Date    WBC 3.7 (L) 05/31/2017    WBC 2.9 (L) 05/28/2017    WBC 4.4 (L) 05/27/2017    WBC 4.4 (L) 05/27/2017    WBC 3.6 (L) 05/26/2017    WBC 2.4 (L) 01/09/2014    WBC 3.3 (L) 10/10/2013    WBC 3.3 (L) 08/15/2013    WBC 3.8 (L) 06/20/2013    WBC 2.8 (L) 05/23/2013     Lab Results   Component Value Date    CREATININE 1.63 (H) 05/31/2017    CREATININE 1.52 (H) 05/28/2017    CREATININE 1.61 (H) 05/27/2017    CREATININE 1.68 (H) 05/27/2017    CREATININE 1.32 (H) 05/26/2017    CREATININE 1.0 09/25/2015 CREATININE 0.9 04/03/2015    CREATININE 0.87 01/09/2014    CREATININE 1.03 10/10/2013    CREATININE 0.86 08/15/2013    CREATININE 1.04 06/20/2013    CREATININE 0.84 05/23/2013     Pathology  A: Liver, left hepatic lobe lesion, biopsy  - Liver with patchy noncaseating  granulomatous inflammation, dense chronic inflammation and focal extensive fibrosis, consistent with clinical history of hepatic abscess  - Limited sample for interpretation of rejection; negative for acute cellular rejection, RAI = 1/9 (portal inflammation score = 0???1, bile duct damage score = 0, venous endothelialitis score = 0)  - Trichrome stain shows prominent perisinusoidal and increased portal fibrosis with periportal septae (stage2)  - No evidence for post transplant lymphoproliferative disorder  - See comment  ??  B: Liver, right hepatic lobe lesion, biopsy  -Scant residual hepatic parenchyma with dense fibrosis, necrosis, and granulomatous inflammation, consistent with clinical history of hepatic abscess   - No evidence for post transplant lymphoproliferative disorder      MICROBIOLOGY:  Urine E. Faecalis 02/28/17  Pleural fluid 05/24/17 negative    IMAGING:  MRI 05/04/17   - Multiple T2 hyperintense enhancing lesions in the liver. The left lobe lesions appear more solid compared to the right side. The differential diagnosis includes abscesses and PTLD. Metastasis is unlikely although still in the differential. The findings are not typical for recurrent HCC. Sampling of these lesions is recommended.  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 Attention on follow-up.

## 2017-06-08 NOTE — Unmapped (Signed)
Pt reports 1 week of chest pain. Pt sent from clinic to evaluate for hemothorax. Pt reports SOB.

## 2017-06-09 DIAGNOSIS — G4700 Insomnia, unspecified: Secondary | ICD-10-CM

## 2017-06-09 DIAGNOSIS — E1142 Type 2 diabetes mellitus with diabetic polyneuropathy: Secondary | ICD-10-CM

## 2017-06-09 DIAGNOSIS — E46 Unspecified protein-calorie malnutrition: Secondary | ICD-10-CM

## 2017-06-09 DIAGNOSIS — Z931 Gastrostomy status: Secondary | ICD-10-CM

## 2017-06-09 DIAGNOSIS — B182 Chronic viral hepatitis C: Secondary | ICD-10-CM

## 2017-06-09 DIAGNOSIS — I5030 Unspecified diastolic (congestive) heart failure: Secondary | ICD-10-CM

## 2017-06-09 DIAGNOSIS — Z794 Long term (current) use of insulin: Secondary | ICD-10-CM

## 2017-06-09 DIAGNOSIS — K8050 Calculus of bile duct without cholangitis or cholecystitis without obstruction: Secondary | ICD-10-CM

## 2017-06-09 DIAGNOSIS — I8510 Secondary esophageal varices without bleeding: Secondary | ICD-10-CM

## 2017-06-09 DIAGNOSIS — J942 Hemothorax: Secondary | ICD-10-CM

## 2017-06-09 DIAGNOSIS — Z7901 Long term (current) use of anticoagulants: Secondary | ICD-10-CM

## 2017-06-09 DIAGNOSIS — N400 Enlarged prostate without lower urinary tract symptoms: Secondary | ICD-10-CM

## 2017-06-09 DIAGNOSIS — K219 Gastro-esophageal reflux disease without esophagitis: Secondary | ICD-10-CM

## 2017-06-09 DIAGNOSIS — Z950 Presence of cardiac pacemaker: Secondary | ICD-10-CM

## 2017-06-09 DIAGNOSIS — Z944 Liver transplant status: Secondary | ICD-10-CM

## 2017-06-09 DIAGNOSIS — G894 Chronic pain syndrome: Secondary | ICD-10-CM

## 2017-06-09 DIAGNOSIS — I272 Pulmonary hypertension, unspecified: Secondary | ICD-10-CM

## 2017-06-09 DIAGNOSIS — R079 Chest pain, unspecified: Principal | ICD-10-CM

## 2017-06-09 DIAGNOSIS — K7460 Unspecified cirrhosis of liver: Secondary | ICD-10-CM

## 2017-06-09 DIAGNOSIS — J9589 Other postprocedural complications and disorders of respiratory system, not elsewhere classified: Secondary | ICD-10-CM

## 2017-06-09 DIAGNOSIS — C220 Liver cell carcinoma: Principal | ICD-10-CM

## 2017-06-09 DIAGNOSIS — E559 Vitamin D deficiency, unspecified: Secondary | ICD-10-CM

## 2017-06-09 DIAGNOSIS — D696 Thrombocytopenia, unspecified: Secondary | ICD-10-CM

## 2017-06-09 DIAGNOSIS — D630 Anemia in neoplastic disease: Secondary | ICD-10-CM

## 2017-06-09 DIAGNOSIS — M961 Postlaminectomy syndrome, not elsewhere classified: Secondary | ICD-10-CM

## 2017-06-09 DIAGNOSIS — K7290 Hepatic failure, unspecified without coma: Secondary | ICD-10-CM

## 2017-06-09 DIAGNOSIS — I480 Paroxysmal atrial fibrillation: Secondary | ICD-10-CM

## 2017-06-09 DIAGNOSIS — Z930 Tracheostomy status: Secondary | ICD-10-CM

## 2017-06-09 LAB — COMPREHENSIVE METABOLIC PANEL
ALBUMIN: 2.5 g/dL — ABNORMAL LOW (ref 3.5–5.0)
ALKALINE PHOSPHATASE: 425 U/L — ABNORMAL HIGH (ref 38–126)
ALT (SGPT): 207 U/L — ABNORMAL HIGH (ref 19–72)
ANION GAP: 5 mmol/L — ABNORMAL LOW (ref 9–15)
AST (SGOT): 164 U/L — ABNORMAL HIGH (ref 19–55)
BILIRUBIN TOTAL: 1.3 mg/dL — ABNORMAL HIGH (ref 0.0–1.2)
BLOOD UREA NITROGEN: 32 mg/dL — ABNORMAL HIGH (ref 7–21)
BUN / CREAT RATIO: 23
CALCIUM: 9.2 mg/dL (ref 8.5–10.2)
CHLORIDE: 102 mmol/L (ref 98–107)
CO2: 28 mmol/L (ref 22.0–30.0)
CREATININE: 1.42 mg/dL — ABNORMAL HIGH (ref 0.70–1.30)
EGFR MDRD AF AMER: >=60 mL/min/{1.73_m2} (ref >=60)
EGFR MDRD NON AF AMER: 49 mL/min/{1.73_m2} — ABNORMAL LOW (ref >=60)
GLUCOSE RANDOM: 94 mg/dL (ref 65–179)
POTASSIUM: 5.2 mmol/L — ABNORMAL HIGH (ref 3.5–5.0)
PROTEIN TOTAL: 4.7 g/dL — ABNORMAL LOW (ref 6.5–8.3)
SODIUM: 135 mmol/L (ref 135–145)

## 2017-06-09 LAB — CBC W/ AUTO DIFF
BASOPHILS ABSOLUTE COUNT: 0 10*9/L (ref 0.0–0.1)
EOSINOPHILS ABSOLUTE COUNT: 0.1 10*9/L (ref 0.0–0.4)
EOSINOPHILS RELATIVE PERCENT: 1 %
HEMATOCRIT: 27.8 % — ABNORMAL LOW (ref 41.0–53.0)
HEMOGLOBIN: 8.8 g/dL — ABNORMAL LOW (ref 13.5–17.5)
LARGE UNSTAINED CELLS: 1 % (ref 0–4)
LYMPHOCYTES ABSOLUTE COUNT: 0.5 10*9/L — ABNORMAL LOW (ref 1.5–5.0)
LYMPHOCYTES RELATIVE PERCENT: 9.2 %
MEAN CORPUSCULAR HEMOGLOBIN CONC: 31.7 g/dL (ref 31.0–37.0)
MEAN CORPUSCULAR HEMOGLOBIN: 28.7 pg (ref 26.0–34.0)
MEAN CORPUSCULAR VOLUME: 90.4 fL (ref 80.0–100.0)
MEAN PLATELET VOLUME: 8.5 fL (ref 7.0–10.0)
MONOCYTES RELATIVE PERCENT: 5.1 %
NEUTROPHILS RELATIVE PERCENT: 83.8 %
PLATELET COUNT: 152 10*9/L (ref 150–440)
RED BLOOD CELL COUNT: 3.07 10*12/L — ABNORMAL LOW (ref 4.50–5.90)
RED CELL DISTRIBUTION WIDTH: 16.6 % — ABNORMAL HIGH (ref 12.0–15.0)
WBC ADJUSTED: 5.2 10*9/L (ref 4.5–11.0)

## 2017-06-09 LAB — MEAN CORPUSCULAR HEMOGLOBIN: Lab: 28.7

## 2017-06-09 LAB — PHOSPHORUS: Phosphate:MCnc:Pt:Ser/Plas:Qn:: 3.7

## 2017-06-09 LAB — CYCLOSPORINE, TROUGH: Lab: 93

## 2017-06-09 LAB — PROTIME: Lab: 19.5 — ABNORMAL HIGH

## 2017-06-09 LAB — CREATININE: Creatinine:MCnc:Pt:Ser/Plas:Qn:: 1.42 — ABNORMAL HIGH

## 2017-06-09 LAB — MAGNESIUM: Magnesium:MCnc:Pt:Ser/Plas:Qn:: 1.7

## 2017-06-09 LAB — CRYPTOCOCCAL ANTIGEN: Lab: NEGATIVE

## 2017-06-09 NOTE — Unmapped (Signed)
Pt admitted from clinic. Was not seen by pharmacy. Will reschedule appt as needed after discharge.

## 2017-06-09 NOTE — Unmapped (Addendum)
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    Assessment/Recommendations:    Jeffrey Ward is a 67 y.o. 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    ID Problem List:  # s/p OLT on 09/16/2016 for chronic hepatitis c and HCC  - CMV D+/R+; EBV D+/R+  - steroid induction  - never treated for rejection     # DM2  - last HA1c 6.5 05/24/2017     # Hepatic abscesses 05/04/2017  - 04/26/17 s/p ERCP which revealed CBD stricture and one CBD stone (choledocholithiasis), s/p CBD stent  - 2/21 hepatic lesions incidentally noted on MRI (largest 5.5cm and 3.8cm)  - 3/13 path of core biopsies (no aspiration or micro data) reveal chronic noncaseating granulomatous inflammation  - 3/28 CT scan hepatic lesions decreased in size from 3/13 (3.8 x 3.5cm ->2.8 x 2.3 cm)    # Right traumatic hemothorax/mediastinal hematoma 3/13 and right pleural effusion 3/29  - s/p IR aspiration 3/13 blood (complication of liver biopsy)  - s/p IR aspiration 3/29 3+ PMN on GS, NGTD    Recommendations:  - Aspirate liver lesion and send for aerobic/anaerobic bacterial cultures, AFB culture, and fungal culture (prioritize in that order)  - Favor holding antimicrobials in the setting of clinical stability and lesions decreasing in size, if primary team feels that antibiotics are necessary than amox/clav would be a reasonable choice (would avoid quinolones in the setting of granulomas - to avoid monotherapy for possible Mycobacterium)    The [facility] ID service will continue to follow. Please page the ID fellow at [phone] with questions.   Sandi Raveling, MD Attending attestation  I saw and evaluated the patient. I agree with the findings and the plan of care as documented in the fellow???s note.  Darlen Round, MD    Addendum 06/11/2017  The [facility] team elected to begin an empirical course of cipro/metrondazole for liver abscess on 06/09/2017. Aspirate obtained after antibiotics started on 3/30 - GS no orgs/PMNs, bacterial cx NGTD; AFB stain will be done on 4/1. He was discharged on 3/31 with a 10 day supply of cipro/metronidazole.   3/28 crypto Ag negative    History of Present Illness:      Jeffrey Ward is a 67 y.o. male     Source of information includes:  Electronic Medical Records and Discussion with patient     Jeffrey Ward is a 67 y.o. 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    He presented this hospitalization after falling in his bathroom and landing on his back and head (CT Head negative for hemorrhage here). He noticed right lower chest pain afterwards and presented with said chest pain to the ED on 3/28.  He was admitted to the transplant surgery service and was found to have a new right pleural effusion that he had drained this morning by VIR. He denies any current symptoms other than the lower right chest pain, including denying fever, chills, night sweats, and abdominal pain.    Of note, the  patient had an ERCP on 04/26/2017 with CBD stricture and a CBD stone that was then complicated by ERCP pancreatitis.  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 The patient's biopsies were complicated by a right-sided hemothorax requiring chest tube placement that was removed on 3/15 and the patient discharged on 3/17 with resolution of the hemothorax.    Allergies:  No Known Allergies    Medications:   Current antibiotics:  None    Current/Prior immunomodulators:  Cyclosporine (goal 150-200)  Mycophenolate 500mg  BID  Prior: Tacrolimus, d/c'ed due to neuro side effects (jerking/tremors)    Other medications reviewed.     Medical History:  Past Medical History:   Diagnosis Date   ??? Cancer (CMS-HCC)    ??? Cirrhosis (CMS-HCC)    ??? Diabetes (CMS-HCC)    ??? Hepatitis C 07/17/2012   ??? Liver disease    ??? Low back pain 07/17/2012   ??? Varices, esophageal (CMS-HCC)        Surgical History:  Past Surgical History:   Procedure Laterality Date   ??? ANKLE SURGERY     ??? BACK SURGERY     ??? BACK SURGERY     ??? CHG US GUIDE, TISSUE ABLATION N/A 01/22/2016    Procedure: ULTRASOUND GUIDANCE FOR, AND MONITORING OF, PARENCHYMAL TISSUE ABLATION;  Surgeon: Particia Nearing, MD;  Location: MAIN OR [facility];  Service: Transplant   ??? IR EMBOLIZATION ORGAN ISCHEMIA, TUMORS, INFAR  06/16/2016    IR EMBOLIZATION ORGAN ISCHEMIA, TUMORS, INFAR 06/16/2016 Ammie Dalton, MD IMG VIR H&V [facility]   ??? PR COLON CA SCRN NOT HI RSK IND N/A 02/27/2015    Procedure: COLOREC CNCR SCR;COLNSCPY NO;  Surgeon: Vonda Antigua, MD;  Location: GI PROCEDURES MEMORIAL [facility];  Service: Gastroenterology   ??? PR ENDOSCOPIC ULTRASOUND EXAM N/A 02/27/2015    Procedure: UGI ENDO; W/ENDO ULTRASOUND EXAM INCLUDES ESOPHAGUS, STOMACH, &/OR DUODENUM/JEJUNUM;  Surgeon: Vonda Antigua, MD;  Location: GI PROCEDURES MEM[facility];  Service: Gastroenterology   ??? PR ERCP BALLOON DILATE BILIARY/PANC DUCT/AMPULLA EA N/A 02/10/2017    Procedure: ERCP;WITH TRANS-ENDOSCOPIC BALLOON DILATION OF BILIARY/PANCREATIC DUCT(S) OR OF AMPULLA, INCLUDING SPHINCTERECTOMY, WHEN PERFOREMD,EACH DUCT (29562);  Surgeon: Mayford Knife, MD;  Location: [facility] PROC[facility]URES [facility]  Service: Gastroenterology   ??? PR ERCP REMOVE FOREIGN BODY/STENT BILIARY/PANC DUCT N/A 02/10/2017 Procedure: ENDOSCOPIC RETROGRADE CHOLANGIOPANCREATOGRAPHY (ERCP); W/ REMOVAL OF FOREIGN BODY/STENT FROM BILIARY/PANCREATIC DUCT(S);  Surgeon: Mayford Knife, MD;  Location: GI PROCEDURES MEM[facility];  Service: Gastroenterology   ??? PR ERCP STENT PLACEMENT BILIARY/PANCREATIC DUCT N/A 11/29/2016    Procedure: ENDOSCOPIC RETROGRADE CHOLANGIOPANCREATOGRAPHY (ERCP); WITH PLACEMENT OF ENDOSCOPIC STENT INTO BILIARY OR PANCREATIC DUCT;  Surgeon: Chriss Driver, MD;  Location: GI PROCEDURES [facility];  Service: Gastroenterology   ??? PR ERCP STENT PLACEMENT BILIARY/PANCREATIC DUCT N/A 04/26/2017    Procedure: ENDOSCOPIC RETROGRADE CHOLANGIOPANCREATOGRAPHY (ERCP); WITH PLACEMENT OF ENDOSCOPIC STENT INTO BILIARY OR PANCREATIC DUCT;  Surgeon: Mayford Knife, MD;  Location: GI PROCEDURES MEMORIAL [facility];  Service: Gastroenterology   ??? PR ERCP,W/REMOVAL STONE,BIL/PANCR DUCTS N/A 11/29/2016    Procedure: ERCP; W/ENDOSCOPIC RETROGRADE REMOVAL OF CALCULUS/CALCULI FROM BILIARY &/OR PANCREATIC DUCTS;  Surgeon: Chriss Driver, MD;  Location: GI PROCEDURES [facility];  Service: Gastroenterology   ??? PR ERCP,W/REMOVAL STONE,BIL/PANCR DUCTS N/A 02/10/2017    Procedure: ERCP; W/ENDOSCOPIC RETROGRADE  REMOVAL OF CALCULUS/CALCULI FROM BILIARY &/OR PANCREATIC DUCTS;  Surgeon: Mayford Knife, MD;  Location: GI PROCEDURES MEMOR[facility];  Service: Gastroenterology   ??? PR ERCP,W/REMOVAL STONE,BIL/PANCR DUCTS N/A 04/26/2017    Procedure: ERCP; W/ENDOSCOPIC RETROGRADE REMOVAL OF CALCULUS/CALCULI FROM BILIARY &/OR PANCREATIC DUCTS;  Surgeon: Mayford Knife, MD;  Location: GI PROC[facility]URES [facility];  Service: Gastroenterology   ??? PR INSER HEART TEMP PACER ONE CHMBR N/A 10/02/2016    Procedure: Tempoarary Pacemaker Insertion;  Surgeon: Meredith Leeds, MD;  Location: [facility];  Service: Cardiology   ??? PR LAP,DIAGNOSTIC ABDOMEN N/A 01/22/2016    Procedure: Laparoscopy, Abdomen, Peritoneum, & Omentum, Diagnostic, W/Wo Collection Specimen(S) By Brushing Or Washing;  Surgeon: Particia Nearing, MD;  Location: MAIN OR [facility];  Service: Transplant   ??? PR PLACE PERCUT GASTROSTOMY TUBE N/A 11/17/2016    Procedure: UGI ENDO; W/DIRECTED PLCMT PERQ GASTROSTOMY TUBE;  Surgeon: Cletis Athens, MD;  Location: GI PROCEDURES MEM[facility];  Service: Gastroenterology   ??? PR TRACHEOSTOMY, PLANNED N/A 09/29/2016    Procedure: TRACHEOSTOMY PLANNED (SEPART PROC);  Surgeon: Katherina Mires, MD;  Location: MAIN OR [facility];  Service: Trauma   ??? PR TRANSCATH INSERT OR REPLACE LEADLESS PM VENTR N/A 10/11/2016    Procedure: Pacemaker Implant/Replace Leadless;  Surgeon: Meredith Leeds, MD;  Location: [facility];  Service: Cardiology   ??? PR TRANSPLANT LIVER,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    Procedure: Liver Allotransplantation; Orthotopic, Partial Or Whole, From Cadaver Or Living Donor, Any Age;  Surgeon: Doyce Loose, MD;  Location: MAIN OR [facility];  Service: Transplant   ??? PR TRANSPLANT,PREP DONOR LIVER, WHOLE N/A 09/15/2016    Procedure: Rogelia Boga Std Prep Cad Donor Whole Liver Gft Prior Tnsplnt,Inc Chole,Diss/Rem Surr Tissu Wo Triseg/Lobe Splt;  Surgeon: Doyce Loose, MD;  Location: MAIN OR [facility];  Service: Transplant   ??? PR UPPER GI ENDOSCOPY,BIOPSY N/A 07/17/2012    Procedure: UGI ENDOSCOPY; WITH BIOPSY, SINGLE OR MULTIPLE;  Surgeon: Alba Destine, MD;  Location: GI PROCEDURES MEMORIAL [facility];  Service: Gastroenterology   ??? PR UPPER GI ENDOSCOPY,DIAGNOSIS N/A 02/04/2014    Procedure: UGI ENDO, INCLUDE ESOPHAGUS, STOMACH, & DUODENUM &/OR JEJUNUM; DX W/WO COLLECTION SPECIMN, BY BRUSH OR WASH;  Surgeon: Wilburt Finlay, MD;  Location: GI PROCEDURES MEMORIAL [facility];  Service: Gastroenterology   ??? PR UPPER GI ENDOSCOPY,LIGAT VARIX N/A 11/05/2013    Procedure: UGI ENDO; W/BAND LIG ESOPH &/OR GASTRIC VARICES;  Surgeon: Wilburt Finlay, MD;  Location: GI PROCEDURES [facility];  Service: Gastroenterology       Social History:  Tobacco use:   reports that he has never smoked. He has never used smokeless tobacco.   Alcohol use:    reports that he does not drink alcohol.   Drug use:    reports that he does not use drugs.   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   Employment:  Previously employed as Art gallery manager for [employer], retired   Personnel officer and Educational psychologist exposure:  Animal exposures include dogs, cats, horses, cows, pigs, goats, llamas   Insect exposure:  Exposed to ticks, mosquitoes   Hobbies:  Used to skydive, dragster race, but doesn't do much anymore since 2017  TB exposures:  Prior negative TB testing: TBT always negative, doesn't remember date of last test   Sexual history:  Sex with women   Other significant exposures:  Exposure to well water     Family History:  Family History   Problem Relation Age of Onset   ??? Hypertension Mother    ??? Cirrhosis Neg Hx    ??? Liver cancer Neg Hx    ??? Anemia Neg Hx    ??? Cancer Neg Hx    ??? Diabetes Neg Hx    ??? Kidney disease Neg Hx    ??? Obesity Neg Hx    ??? Thyroid disease Neg Hx    ??? Osteoporosis Neg Hx    ??? Coronary artery disease Neg Hx    ??? Anesthesia problems Neg Hx        Review of Systems:  10 systems reviewed and negative except as per HPI.          Vital Signs last 24 hours:  Temp:  [36.1 ??C-37.2 ??C] 36.8 ??C  Heart Rate:  [70-110] 76  SpO2 Pulse:  [85-106] 85  Resp:  [12-34] 15  BP: (111-171)/(50-95) 118/64  MAP (mmHg):  [71-99] 71  SpO2:  [30 %-100 %] 100 %  BMI (Calculated):  [20.4] 20.4    Physical Exam:  Patient Lines/Drains/Airways Status    Active Active Lines, Drains, & Airways     Name:   Placement date:   Placement time:   Site:   Days:    Gastrostomy/Enterostomy Percutaneous endoscopic gastrostomy (PEG) 20 Fr. LUQ  11/17/16    0934    LUQ    204    Peripheral IV 06/08/17 Left Forearm  06/08/17    1250    Forearm    less than 1    Peripheral IV 06/08/17 Left Hand  06/08/17    1250    Hand    less than 1              GEN:  looks well, no apparent distress  EYES: sclerae anicteric and non injected and PERRL, EOMI  ZOX:[id number] good  LYMPH:no cervical or supraclavicular LAD  CV:RRR, no abnormal heart sounds noted and no peripheral edema  PULM:normal work of breathing at rest and CTAB anteriorly  WJX:BJYN, NTND, nl BS  SKIN:no petechiae, ecchymoses or obvious rashes on clothed exam  MSK:no vertebral point tenderness and no swollen joints  NEURO:no tremor noted, facial expression symmetric and moves extremities equally  PSYCH:attentive, appropriate affect, good eye contact, fluent speech    Imaging:  CXR (06/08/2017): New small right pleural effusion.  Imaging was reviewed in Epic.    CT abd (06/08/2017): Multiple hepatic lesions, decreased in size from 3/13  Imaging was reviewed in Epic.     MRI abd 2/21  Hepatic segment 8 peripherally T2 hyperintense and peripherally enhancing enhancing lesions measuring approximately 5.5 x 2.8 x 2.6 cm (11:28, 13:49) and hepatic segment 8 lesion measures approximately 3.8 x 3.5 x 3.1 (11:32, 13:45). These lesions have well-defined borders and restricted diffusion.    Microbiology:  Past cultures were reviewed in Epic.    Labs:    Lab Results   Component Value Date    WBC 5.2 06/09/2017    WBC 5.1 06/08/2017    WBC 2.4 (L) 01/09/2014    WBC 3.3 (L) 10/10/2013    HGB 8.8 (L) 06/09/2017    HGB 6.2 (L) 09/26/2016    HCT 27.8 (L) 06/09/2017    HCT  35.4 (L) 01/09/2014    PLT 152 06/09/2017    PLT 35 (L) 01/09/2014    NEUTROABS 4.3 06/09/2017    NEUTROABS 1.1 (L) 01/09/2014    LYMPHSABS 0.5 (L) 06/09/2017    LYMPHSABS 0.9 (L) 01/09/2014    EOSABS 0.1 06/09/2017    EOSABS 0.3 01/09/2014    NA 135 06/09/2017    NA 141 09/26/2016    K 5.2 (H) 06/09/2017    K 4.1 09/26/2016    BUN 32 (H) 06/09/2017    BUN 12 01/09/2014    CREATININE 1.42 (H) 06/09/2017 CREATININE 1.53 (H) 06/08/2017    CREATININE 1.0 09/25/2015    CREATININE 0.9 04/03/2015    GLU 94 06/09/2017    GLU 101 10/10/2013    MG 1.7 06/09/2017    ALBUMIN 2.5 (L) 06/09/2017    ALBUMIN 2.6 (L) 01/09/2014    BILITOT 1.3 (H) 06/09/2017    BILITOT 2.4 (H) 01/09/2014    AST 164 (H) 06/09/2017    AST 46 01/09/2014    ALT 207 (H) 06/09/2017    ALT 41 01/09/2014    ALKPHOS 425 (H) 06/09/2017    ALKPHOS 214 (H) 01/09/2014    INR 1.71 06/09/2017    INR 1.3 01/09/2014    ESR 82 (H) 06/08/2017    CRP 70.5 (H) 06/08/2017    CKTOTAL <20.0 (L) 10/15/2016    CKTOTAL <20.0 (L) 10/15/2016       Serologies:  Lab Results   Component Value Date    CMVIGG Positive (A) 09/14/2016    CMVIGG POSITIVE (AB) 05/17/2012    EBVIGG Positive (A) 09/14/2016    EBVIGG POSITIVE 05/17/2012    HIVAGAB Nonreactive 09/21/2016    HIVAGAB Nonreactive 05/17/2012    HEPAIGG Reactive (A) 11/24/2015    HEPBSAG Nonreactive 01/11/2017    HEPBSAG Negative 05/17/2012    HEPBSAB Reactive (A) 09/14/2016    HEPBSAB Reactive 06/12/2008    HEPBCAB Reactive (A) 09/14/2016    HEPBCAB POSITIVE (AB) 06/02/2008    HEPCAB Reactive (A) 01/11/2017    HEPCAB see below 06/02/2008    RPR Nonreactive 09/14/2016    HSV1IGG Positive (A) 09/14/2016    HSV1IGG POSITIVE 05/17/2012    HSV2IGG Negative 09/14/2016    HSV2IGG NEGATIVE 05/17/2012    VZVIGG Positive 09/14/2016    VZVIGG POSITIVE 05/17/2012    RUBIG Positive 11/24/2015    TOXOIGG Negative 09/14/2016    QFTTBGOLD Negative 12/23/2015    QFTNILVALUE 0.06 12/23/2015    QFTMITOGEN >10.00 12/23/2015    QFTAGMINNIL -0.03 12/23/2015       Immunizations:  Immunization History   Administered Date(s) Administered   ??? INFLUENZA TIV (TRI) 70MO+ W/ PRESERV (IM) 01/07/2014   ??? Influenza Vaccine Quad (IIV4 PF) 47mo+ injectable 12/23/2015, 01/12/2017   ??? Influenza Virus Vaccine, unspecified formulation 01/07/2014   ??? PNEUMOCOCCAL POLYSACCHARIDE 23 01/20/2011, 03/01/2017   ??? TdaP 01/12/2009, 05/17/2012   ??? ZOSTAVAX - ZOSTER VACCINE, LIVE, SQ 05/17/2012

## 2017-06-09 NOTE — Unmapped (Signed)
CM obtained updates from NP/JenniferV. She reports that team is waiting for ID to weigh in. Liver spots seen on MRI. Team will need to discuss with VIR about another biopsy but at last biopsy pt experienced complications. PT will visit pt as he has been falling at home.     No anticipated d/c date at this time.    Sibyl Parr, MSW, LCSW

## 2017-06-09 NOTE — Unmapped (Signed)
Problem: Patient Care Overview  Goal: Plan of Care Review  Outcome: Not Progressing  Pt is alert and oriented x4, call bell in reach, bed in low position. Pt c/o pain this shift. PRN medication given, pt is resting at this time. No s/s of distress noted. VSS. POC gone over with pt, no questions at this time. Will continue to monitor.

## 2017-06-09 NOTE — Unmapped (Signed)
Care Management  Initial Transition Planning Assessment    Per H&P, pt is a 67 year old male w/ a h/o hepatitis C cirrhosis and HCC s/p OLT in 09/2016, paroxysmal afib on coumadin, HFpEF, PAH, implanted pacemaker, IDDM, and CBD stones s/p ERCP who was recently found to have multiple liver masses for which he underwent a biopsy w/ IR on 05/24/17 which was complicated by right sided hemothorax s/p pigtail placement in the ER by interventional pulmonology. ??He represents to the ER 5 days after a likely mechanical fall at home and was found to have a right sided pleural effusion w/ continued 3 liver lesions concerning for abscess versus granulomas. ??He has been afebrile, but his liver enzymes and bilirubin have been elevated. ??    Per CAPP rounds, ID and VIR are following and will assist in confirming POC. PT c/s placed and pt will likely stay in house through the weekend. Pt has Medicare and is followed by txplant SW.    Triggers for social work consultation identified on admission.  Social worker consulted for full psychosocial and discharge needs assessment.  CM will continue to follow for avoidable delays and opportunities for progression of care.                General  Care Manager assessed the patient by : Medical record review, Discussion with Clinical Care team             Financial Information:          Need for financial assistance?: Other (Comment) (defer to SW)       Discharge Needs Assessment:    Concerns to be Addressed: discharge planning    Clinical Risk Factors: Multiple Diagnoses (Chronic), > 65, Readmission < 30 Days         Prior overnight hospital stay or ED visit in last 90 days: Yes            Discharge Plan:         Expected Discharge Date: 06/13/17      Lauris Chroman, [place]; Pager [phone].   For CM assistance during the weekend, please page [phone].  June 09, 2017 4:18 PM

## 2017-06-09 NOTE — Unmapped (Addendum)
Social Work  Psychosocial Assessment    Patient Name: Jeffrey Ward   Medical Record Number: [id number]   Date of Birth: [birth year]  Sex: Male     Referral  Referred by:  (txp)  Reason for Referral: Transplant    Discharge Planning  Discharge Planning Information:   Type of Residence: Mailing Address:  [address]  [place] [address]  Contacts:   Accompanied by: Significant other, Alone  Password: Eli  Contact Details: Primary Contact  Primary Contact Name: Carey Johndrow  Primary Contact Relationship: Spouse  Phone #1: [phone]           Medical Information:     Past Medical History:   Diagnosis Date   ??? Cancer (CMS-HCC)    ??? Cirrhosis (CMS-HCC)    ??? Diabetes (CMS-HCC)    ??? Hepatitis C 07/17/2012   ??? Liver disease    ??? Low back pain 07/17/2012   ??? Varices, esophageal (CMS-HCC)     Social History   Substance Use Topics   ??? Smoking status: Never Smoker   ??? Smokeless tobacco: Never Used      Comment: Smoked in high school for about 5 years.    ??? Alcohol use No      Past Surgical History:   Procedure Laterality Date   ??? ANKLE SURGERY     ??? BACK SURGERY     ??? BACK SURGERY     ??? CHG US GUIDE, TISSUE ABLATION N/A 01/22/2016    Procedure: ULTRASOUND GUIDANCE FOR, AND MONITORING OF, PARENCHYMAL TISSUE ABLATION;  Surgeon: Particia Nearing, MD;  Location: MAIN OR [facility];  Service: Transplant   ??? IR EMBOLIZATION ORGAN ISCHEMIA, TUMORS, INFAR  06/16/2016    IR EMBOLIZATION ORGAN ISCHEMIA, TUMORS, INFAR 06/16/2016 Ammie Dalton, MD IMG VIR [facility]   ??? PR COLON CA SCRN NOT HI RSK IND N/A 02/27/2015    Procedure: COLOREC CNCR SCR;COLNSCPY NO;  Surgeon: Vonda Antigua, MD;  Location: GI PROCEDURES MEMORIAL [facility];  Service: Gastroenterology   ??? PR ENDOSCOPIC ULTRASOUND EXAM N/A 02/27/2015    Procedure: UGI ENDO; W/ENDO ULTRASOUND EXAM INCLUDES ESOPHAGUS, STOMACH, &/OR DUODENUM/JEJUNUM;  Surgeon: Vonda Antigua, MD;  Location: GI PROCEDURES MEMORIAL [facility];  Service: Gastroenterology   ??? PR ERCP BALLOON DILATE BILIARY/PANC DUCT/AMPULLA EA N/A 02/10/2017    Procedure: ERCP;WITH TRANS-ENDOSCOPIC BALLOON DILATION OF BILIARY/PANCREATIC DUCT(S) OR OF AMPULLA, INCLUDING SPHINCTERECTOMY, WHEN PERFOREMD,EACH DUCT ([id number]);  Surgeon: Mayford Knife, MD;  Location: GI PROCEDURES MEMORIAL [facility];  Service: Gastroenterology   ??? PR ERCP REMOVE FOREIGN BODY/STENT BILIARY/PANC DUCT N/A 02/10/2017    Procedure: ENDOSCOPIC RETROGRADE CHOLANGIOPANCREATOGRAPHY (ERCP); W/ REMOVAL OF FOREIGN BODY/STENT FROM BILIARY/PANCREATIC DUCT(S);  Surgeon: Mayford Knife, MD;  Location: GI PROCEDURES MEMORIAL [facility];  Service: Gastroenterology   ??? PR ERCP STENT PLACEMENT BILIARY/PANCREATIC DUCT N/A 11/29/2016    Procedure: ENDOSCOPIC RETROGRADE CHOLANGIOPANCREATOGRAPHY (ERCP); WITH PLACEMENT OF ENDOSCOPIC STENT INTO BILIARY OR PANCREATIC DUCT;  Surgeon: Chriss Driver, MD;  Location: GI PROCEDURES MEMORIAL [facility];  Service: Gastroenterology   ??? PR ERCP STENT PLACEMENT BILIARY/PANCREATIC DUCT N/A 04/26/2017    Procedure: ENDOSCOPIC RETROGRADE CHOLANGIOPANCREATOGRAPHY (ERCP); WITH PLACEMENT OF ENDOSCOPIC STENT INTO BILIARY OR PANCREATIC DUCT;  Surgeon: Mayford Knife, MD;  Location: GI PROCEDURES MEMORIAL [facility];  Service: Gastroenterology   ??? PR ERCP,W/REMOVAL STONE,BIL/PANCR DUCTS N/A 11/29/2016    Procedure: ERCP; W/ENDOSCOPIC RETROGRADE REMOVAL OF CALCULUS/CALCULI FROM BILIARY &/OR PANCREATIC DUCTS;  Surgeon: Chriss Driver, MD;  Location: GI PROCEDURES MEMORIAL [facility];  Service: Gastroenterology   ???  PR ERCP,W/REMOVAL STONE,BIL/PANCR DUCTS N/A 02/10/2017    Procedure: ERCP; W/ENDOSCOPIC RETROGRADE REMOVAL OF CALCULUS/CALCULI FROM BILIARY &/OR PANCREATIC DUCTS;  Surgeon: Mayford Knife, MD;  Location: GI PROCEDURES MEMORIAL [facility];  Service: Gastroenterology   ??? PR ERCP,W/REMOVAL STONE,BIL/PANCR DUCTS N/A 04/26/2017    Procedure: ERCP; W/ENDOSCOPIC RETROGRADE REMOVAL OF CALCULUS/CALCULI FROM BILIARY &/OR PANCREATIC DUCTS;  Surgeon: Mayford Knife, MD;  Location: GI PROCEDURES MEMORIAL [facility];  Service: Gastroenterology   ??? PR INSER HEART TEMP PACER ONE CHMBR N/A 10/02/2016    Procedure: Tempoarary Pacemaker Insertion;  Surgeon: Meredith Leeds, MD;  Location: [facility];  Service: Cardiology   ??? PR LAP,DIAGNOSTIC ABDOMEN N/A 01/22/2016    Procedure: Laparoscopy, Abdomen, Peritoneum, & Omentum, Diagnostic, W/Wo Collection Specimen(S) By Brushing Or Washing;  Surgeon: Particia Nearing, MD;  Location: MAIN OR [facility];  Service: Transplant   ??? PR PLACE PERCUT GASTROSTOMY TUBE N/A 11/17/2016    Procedure: UGI ENDO; W/DIRECTED PLCMT PERQ GASTROSTOMY TUBE;  Surgeon: Cletis Athens, MD;  Location: GI PROCEDURES MEMORIAL [facility];  Service: Gastroenterology   ??? PR TRACHEOSTOMY, PLANNED N/A 09/29/2016    Procedure: TRACHEOSTOMY PLANNED (SEPART PROC);  Surgeon: Katherina Mires, MD;  Location: MAIN OR [facility];  Service: Trauma   ??? PR TRANSCATH INSERT OR REPLACE LEADLESS PM VENTR N/A 10/11/2016    Procedure: Pacemaker Implant/Replace Leadless;  Surgeon: Meredith Leeds, MD;  Location: [facility];  Service: Cardiology   ??? PR TRANSPLANT LIVER,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    Procedure: Liver Allotransplantation; Orthotopic, Partial Or Whole, From Cadaver Or Living Donor, Any Age;  Surgeon: Doyce Loose, MD;  Location: MAIN OR [facility];  Service: Transplant   ??? PR TRANSPLANT,PREP DONOR LIVER, WHOLE N/A 09/15/2016    Procedure: Rogelia Boga Std Prep Cad Donor Whole Liver Gft Prior Tnsplnt,Inc Chole,Diss/Rem Surr Tissu Wo Triseg/Lobe Splt;  Surgeon: Doyce Loose, MD;  Location: MAIN OR [facility];  Service: Transplant   ??? PR UPPER GI ENDOSCOPY,BIOPSY N/A 07/17/2012    Procedure: UGI ENDOSCOPY; WITH BIOPSY, SINGLE OR MULTIPLE;  Surgeon: Alba Destine, MD;  Location: GI PROCEDURES MEMORIAL [facility];  Service: Gastroenterology   ??? PR UPPER GI ENDOSCOPY,DIAGNOSIS N/A 02/04/2014    Procedure: UGI ENDO, INCLUDE ESOPHAGUS, STOMACH, & DUODENUM &/OR JEJUNUM; DX W/WO COLLECTION SPECIMN, BY BRUSH OR WASH; Surgeon: Wilburt Finlay, MD;  Location: GI PROCEDURES [facility]ORIAL [facility];  Service: Gastroenterology   ??? PR UPPER GI ENDOSCOPY,LIGAT VARIX N/A 11/05/2013    Procedure: UGI ENDO; W/BAND LIG ESOPH &/OR GASTRIC VARICES;  Surgeon: Wilburt Finlay, MD;  Location: GI PROCEDURES MEMOR[facility];  Service: Gastroenterology    Family History   Problem Relation Age of Onset   ??? Hypertension Mother    ??? Cirrhosis Neg Hx    ??? Liver cancer Neg Hx    ??? Anemia Neg Hx    ??? Cancer Neg Hx    ??? Diabetes Neg Hx    ??? Kidney disease Neg Hx    ??? Obesity Neg Hx    ??? Thyroid disease Neg Hx    ??? Osteoporosis Neg Hx    ??? Coronary artery disease Neg Hx    ??? Anesthesia problems Neg Hx           Previous admit date: 05/25/2017     Financial Information:   Primary Insurance: Payor: MEDICARE / Plan: MEDICARE PART A AND PART B / Product Type: *No Product type* /    Secondary Insurance: Secondary Insurance  [employer]ARP   Prescription Coverage: Nurse, learning disability (  listed above)   Preferred Pharmacy: [facility] - [place], [place] - [address]  [facility] - [place], [place] - [address]    Barriers to taking medication: No    Transition Home:   Transportation at time of discharge: Family/Friend's Private Vehicle    Anticipated changes related to Illness: inability to care for self inability to care for someone else   Services in place prior to admission: [facility] PT[facility]OT   Services anticipated for DC: tbd   Hemodialysis Prior to Admission: No    Readmission Within the Last 30 Days: Transitions: yes    Legal NOK/Guardian/POA/AD:   Surrogate Decision Makers: At this time the patient is able to make  his own decisions.    Contact and Advanced Directives  Contact Details  Contact Details: Primary Contact  Primary Contact Name: Deontae Robson  Primary Contact Relationship: Spouse  Phone #1: [phone]    Advance Directive (Medical Treatment)  Does patient have an advance directive covering medical treatment?: Patient has advance directive covering medical treatment, copy in chart.  Type of advance directive: : Living will  Advance directive covering medical treatment not in Chart:: Copy requested from family  Reason patient does not have an advance directive covering medical treatment:: Patient does not wish to complete one at this time  Surrogate decision maker appointed:: No  Reason there is not a surrogate decision maker appointed:: Patient does not wish to appoint a surrogate decision maker at this time  Information provided on advance directive:: Yes  Patient requests assistance:: No          Social History  Support Systems: Spouse, Family Members, Financial planner and Psychiatric History  Psychosocial Stressors: Coping with health challenges/recent hospitalization      Psychological Issues/Information: Comment           Comment: tbd  Chemical Dependency: None       Outpatient Providers: Specialist   Name / Contact #: : [facility]  Legal: No legal issues      Ability to [employer]: No issues accessing community services

## 2017-06-09 NOTE — Unmapped (Signed)
[place] INTERVENTIONAL RADIOLOGY   POST-PROCEDURE NOTE       Procedure Name: No admission procedures for hospital encounter.    Pre-Op Diagnosis: Right Pleural Effusion    Post-Op Diagnosis: Same as pre-operative diagnosis    VIR Providers    Attending: Dr. Claretta Fraise  Assistant: Dr. Marylene Buerger    Description of procedure: Right thoracentesis with drainage of 430 mL of serosanguinous fluid sent for culture.    Estimated Blood Loss: approximately 0 mL  Complications: None    See detailed procedure note with images in PACS [facility]).    The patient tolerated the procedure well without incident or complication and was returned to the Floor in stable condition.    Marylene Buerger, MD  PGY-3 Radiology Resident  06/09/2017 10:42 AM

## 2017-06-09 NOTE — Unmapped (Signed)
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 Cancer (CMS-HCC)    ??? Cirrhosis (CMS-HCC)    ??? Diabetes (CMS-HCC)    ??? Hepatitis C 07/17/2012   ??? Liver disease    ??? Low back pain 07/17/2012   ??? Varices, esophageal (CMS-HCC)    Nutrition Hx: Last seen in transplant clinic yesterday, 3/28. Reported decreased appetite & intake for several weeks-months and ongoing weight loss. Consuming 2 supplements (Glucerna or Premier Protein) per day. No nausea/vomiting reported to RD but per ID notes, vomiting often after meals.   Nutritionally Pertinent Meds: Reviewed; insulin regimen, bowel regimen, remeron  Labs: Reviewed; K+ 5.2, CRP 70.5, glucose POC WNL  Abd/GI: no documented BMS since admission; no documented episodes of emesis   Skin: see below  Patient Lines/Drains/Airways Status    Active Wounds     None               Current nutrition therapy order:   Nutrition Orders          NPO Sips with meds; Procedure/Test starting at 03/29 0001         Anthropometric Data:  -- Height: 170.2 cm (5' 7)   -- Last recorded weight: 59 kg (130 lb)  -- Admission weight: 59 kg  -- IBW: 67.2 kg  -- Percent IBW: ~87%  -- BMI: Body mass index is 20.36 kg/m??.   -- Weight changes this admission:   Last 5 Recorded Weights    06/08/17 1141   Weight: 59 kg (130 lb)      -- Weight history PTA: per EPIC wt hx, pt has experienced ~6.4% wt loss x ~1.5 weeks. However, suspect some component of fluid loss as pt was admitted around 3/17  Wt Readings from Last 10 Encounters:   06/08/17 59 kg (130 lb)   06/08/17 58.1 kg (128 lb)   06/08/17 58.1 kg (128 lb)   06/08/17 58.1 kg (128 lb)   06/08/17 59.4 kg (131 lb)   05/28/17 63.1 kg (139 lb 1.8 oz)   05/24/17 59.9 kg (132 lb)   05/04/17 58.7 kg (129 lb 8 oz)   05/04/17 58.7 kg (129 lb 8 oz)   05/04/17 58.7 kg (129 lb 8 oz)      Daily Estimated Nutrient Needs:   Energy: 1770-2065 kcals [30-35 kcal/kg using admission body weight, 59 kg (06/09/17 1010)]  Protein: 71-89 gm [1.2-1.5 gm/kg using admission body weight, 59 kg (06/09/17 1010)]  Carbohydrate:   [< / equal to 45% of kcal]  Fluid:   [per MD team]; recommend 1 mL/kcal unless otherwise indicated     Nutrition Focused Physical Exam:  NFPE completed several months ago - suspect findings have worsened 2/2 poor nutritional status and no notable weight gain  Fat Areas Examined  Orbital: Moderate loss  Upper Arm: Moderate loss      Muscle Areas Examined  Temple: Severe loss  Clavicle: Severe loss  Acromion: Severe loss  Scapular: Severe loss  Anterior Thigh: Moderate loss  Posterior Calf: Moderate loss         Nutrition Evaluation  Overall Impressions: Moderate fat  loss;Severe muscle loss (06/09/17 1011)     DIAGNOSIS:  Malnutrition Assessment using AND/ASPEN Clinical Characteristics:  Severe Protein-Calorie Malnutrition in the context of acute illness or injury (06/09/17 1012)     Energy Intake: < or equal to 50% of estimated energy requirement of > or equal to 5 days  Fat Loss: Moderate  Muscle Loss: Severe                 Overall nutrition impression: pt has been without adequate nutrition for several weeks --> months; nutrition hx somewhat unclear from pt and wife's report. He has experienced frequent nausea/vomiting and early satiety as well as weakness and fatigue. While he was encouraged to drink at least 3 ONS per day, he has only consumed ~2 at most.     Suggest PO diet liberalization as soon as medically appropriate towards final goal of Regular. Recommend initiation of calorie count to assess adequacy of intake and Glucerna/Ensure High Protein or CIB Light Start TID for added nutritional support. If not meeting at least ~60% of needs PO, suggest supplemental TFs via G-tube.    Pt may benefit from scheduled zofran as opposed to PRN and MVI w/ minerals.      GOALS:  Oral Intake:       - Patient to meet >75% kcal and protein needs per calorie count results.  Anthropometric:       - Maintain weight within 1-2% of UBW over course of hospitalization.  Laboratory Data:       - Electrolyte and renal profile will trend towards normal limits.     RECOMMENDATIONS AND INTERVENTIONS:  Monitor NPO status, medical nutrition therapy advancement and tolerance  Recommend oral nutrition supplements : Ensure High Protein, Glucerna, CIB Light Start TID once PO diet advanced  Vitamin and mineral supplementation of MVI w/ minerals  Request twice weekly weight(s)   Continue remeron  Start calorie count    RD Follow Up Parameters:  1-2 times per week (and more frequent as indicated)     Jackqulyn Livings MPH, RD, LDN  Pager: [phone]

## 2017-06-09 NOTE — Unmapped (Signed)
Order placed for dietary tray at this time.

## 2017-06-09 NOTE — Unmapped (Signed)
PHYSICAL THERAPY  Evaluation (06/09/17 1349)     Patient Name:  Jeffrey Ward       Medical Record Number: [id number]   Date of Birth: 1950/11/29  Sex: Male            Treatment Diagnosis: Deconditinoing, Hx of OLT, presenting after mechanical fall at home with R sided pleural effusion    ASSESSMENT    Hasani is a 67 yo M well known to our service from prior admissions, presenting this admission s/p fall at home. He states that he slipped on some water in the bathroom after getting out of the shower, while reaching for a towel. Denies other falls at home and admits that he was moving too quickly causing this recent fall. Reviewed home safety and he states he has bathmats in the bathroom and typically does not have any trouble navigating the shower. Defer to OT for further education about ADL safety. We can f/u to review stairs prior to d/c. Koby states that he has physically declined since his most recent other readmission for the flu. Recommend he pursue 3x/wk post acute PT.    Today's Interventions: AMPAC 21/24, reviewed role of PT, POC    Activity Tolerance: Patient tolerated treatment well;Patient limited by fatigue (Pt comfortable and in NAD following PT, left reclined in bedside chair with needs within reach.)    PLAN  Planned Frequency of Treatment:  1x per day for: 3-4x week      Planned Interventions: Airway Clearance;Balance activities;Diaphragmatic / Pursed-lip breathing;Education - Patient;Education - Family / caregiver;Endurance activities;Functional mobility;Home exercise program;Gait training;Self-care / Home training;Stair training;Therapeutic exercise;Therapeutic activity;Transfer training    Post-Discharge Physical Therapy Recommendations:  3x weekly        PT DME Recommendations: None (has DME)     Goals:                SHORT GOAL #1: Independent/mod I for all transfers              Time Frame : 2 weeks  SHORT GOAL #2: ambulate mod I with rollator for 150'              Time Frame : 2 weeks SHORT GOAL #3: 1 x 250' mod I with rollator              Time Frame : 2 weeks  SHORT GOAL #4: 1 x 7 steps up/down SBA with hand rail              Time Frame : 2 weeks                      Prognosis:  Good  Barriers to Discharge: Endurance deficits  Positive Indicators: PLOF    SUBJECTIVE  Patient reports: RN cleared pt for PT, pt agreeable to PT.  Current Functional Status: Pt reclined in bed, alert and in NAD.     Prior functional status: Has been mod I with rollator. His wife typically helps him get his rollator up the stairs while he uses the handrail, but he says sometimes he drags the rollator up with one hand while holding hand rail with the other hand.  Equipment available at home: Rollator    Past Medical History:   Diagnosis Date   ??? Cancer (CMS-HCC)    ??? Cirrhosis (CMS-HCC)    ??? Diabetes (CMS-HCC)    ??? Hepatitis C 07/17/2012   ??? Liver disease    ??? Low back pain 07/17/2012   ???  Varices, esophageal (CMS-HCC)     Social History   Substance Use Topics   ??? Smoking status: Never Smoker   ??? Smokeless tobacco: Never Used      Comment: Smoked in high school for about 5 years.    ??? Alcohol use No      Past Surgical History:   Procedure Laterality Date   ??? ANKLE SURGERY     ??? BACK SURGERY     ??? BACK SURGERY     ??? CHG US GUIDE, TISSUE ABLATION N/A 01/22/2016    Procedure: ULTRASOUND GUIDANCE FOR, AND MONITORING OF, PARENCHYMAL TISSUE ABLATION;  Surgeon: Particia Nearing, MD;  Location: MAIN OR [facility];  Service: Transplant   ??? IR EMBOLIZATION ORGAN ISCHEMIA, TUMORS, INFAR  06/16/2016    IR EMBOLIZATION ORGAN ISCHEMIA, TUMORS, INFAR 06/16/2016 Ammie Dalton, MD IMG VIR [facility]   ??? PR COLON CA SCRN NOT HI RSK IND N/A 02/27/2015    Procedure: COLOREC CNCR SCR;COLNSCPY NO;  Surgeon: Vonda Antigua, MD;  Location: GI PROC[facility]URES [facility];  Service: Gastroenterology   ??? PR ENDOSCOPIC ULTRASOUND EXAM N/A 02/27/2015    Procedure: UGI ENDO; W/ENDO ULTRASOUND EXAM INCLUDES ESOPHAGUS, STOMACH, &/OR DUODENUM/JEJUNUM;  Surgeon: Vonda Antigua, MD;  Location: GI PROCEDURES MEM[facility];  Service: Gastroenterology   ??? PR ERCP BALLOON DILATE BILIARY/PANC DUCT/AMPULLA EA N/A 02/10/2017    Procedure: ERCP;WITH TRANS-ENDOSCOPIC BALLOON DILATION OF BILIARY/PANCREATIC DUCT(S) OR OF AMPULLA, INCLUDING SPHINCTERECTOMY, WHEN PERFOREMD,EACH DUCT (29528);  Surgeon: Mayford Knife, MD;  Location: GI PROCEDURES [facility];  Service: Gastroenterology   ??? PR ERCP REMOVE FOREIGN BODY/STENT BILIARY/PANC DUCT N/A 02/10/2017    Procedure: ENDOSCOPIC RETROGRADE CHOLANGIOPANCREATOGRAPHY (ERCP); W/ REMOVAL OF FOREIGN BODY/STENT FROM BILIARY/PANCREATIC DUCT(S);  Surgeon: Mayford Knife, MD;  Location: GI PROCEDURES [facility];  Service: Gastroenterology   ??? PR ERCP STENT PLACEMENT BILIARY/PANCREATIC DUCT N/A 11/29/2016    Procedure: ENDOSCOPIC RETROGRADE CHOLANGIOPANCREATOGRAPHY (ERCP); WITH PLACEMENT OF ENDOSCOPIC STENT INTO BILIARY OR PANCREATIC DUCT;  Surgeon: Chriss Driver, MD;  Location: GI PROCEDURES [facility];  Service: Gastroenterology   ??? PR ERCP STENT PLACEMENT BILIARY/PANCREATIC DUCT N/A 04/26/2017    Procedure: ENDOSCOPIC RETROGRADE CHOLANGIOPANCREATOGRAPHY (ERCP); WITH PLACEMENT OF ENDOSCOPIC STENT INTO BILIARY OR PANCREATIC DUCT;  Surgeon: Mayford Knife, MD;  Location: GI PROCEDURES [facility];  Service: Gastroenterology   ??? PR ERCP,W/REMOVAL STONE,BIL/PANCR DUCTS N/A 11/29/2016    Procedure: ERCP; W/ENDOSCOPIC RETROGRADE REMOVAL OF CALCULUS/CALCULI FROM BILIARY &/OR PANCREATIC DUCTS;  Surgeon: Chriss Driver, MD;  Location: GI PROCEDURES [facility];  Service: Gastroenterology   ??? PR ERCP,W/REMOVAL STONE,BIL/PANCR DUCTS N/A 02/10/2017    Procedure: ERCP; W/ENDOSCOPIC RETROGRADE REMOVAL OF CALCULUS/CALCULI FROM BILIARY &/OR PANCREATIC DUCTS;  Surgeon: Mayford Knife, MD;  Location: GI PROCEDURES [facility];  Service: Gastroenterology   ??? PR ERCP,W/REMOVAL STONE,BIL/PANCR DUCTS N/A 04/26/2017    Procedure: ERCP; W/ENDOSCOPIC RETROGRADE REMOVAL OF CALCULUS/CALCULI FROM BILIARY &/OR PANCREATIC DUCTS;  Surgeon: Mayford Knife, MD;  Location: GI PROCEDURES MEMOR[facility];  Service: Gastroenterology   ??? PR INSER HEART TEMP PACER ONE CHMBR N/A 10/02/2016    Procedure: Tempoarary Pacemaker Insertion;  Surgeon: Meredith Leeds, MD;  Location: [facility];  Service: Cardiology   ??? PR LAP,DIAGNOSTIC ABDOMEN N/A 01/22/2016    Procedure: Laparoscopy, Abdomen, Peritoneum, & Omentum, Diagnostic, W/Wo Collection Specimen(S) By Brushing Or Washing;  Surgeon: Particia Nearing, MD;  Location: MAIN OR [facility];  Service: Transplant   ??? PR PLACE PERCUT GASTROSTOMY TUBE N/A 11/17/2016  Procedure: UGI ENDO; W/DIRECTED PLCMT PERQ GASTROSTOMY TUBE;  Surgeon: Cletis Athens, MD;  Location: GI PROCEDURES MEMORIAL [facility];  Service: Gastroenterology   ??? PR TRACHEOSTOMY, PLANNED N/A 09/29/2016    Procedure: TRACHEOSTOMY PLANNED (SEPART PROC);  Surgeon: Katherina Mires, MD;  Location: MAIN OR [facility];  Service: Trauma   ??? PR TRANSCATH INSERT OR REPLACE LEADLESS PM VENTR N/A 10/11/2016    Procedure: Pacemaker Implant/Replace Leadless;  Surgeon: Meredith Leeds, MD;  Location: [facility];  Service: Cardiology   ??? PR TRANSPLANT LIVER,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    Procedure: Liver Allotransplantation; Orthotopic, Partial Or Whole, From Cadaver Or Living Donor, Any Age;  Surgeon: Doyce Loose, MD;  Location: MAIN OR [facility];  Service: Transplant   ??? PR TRANSPLANT,PREP DONOR LIVER, WHOLE N/A 09/15/2016    Procedure: Rogelia Boga Std Prep Cad Donor Whole Liver Gft Prior Tnsplnt,Inc Chole,Diss/Rem Surr Tissu Wo Triseg/Lobe Splt;  Surgeon: Doyce Loose, MD;  Location: MAIN OR [facility];  Service: Transplant   ??? PR UPPER GI ENDOSCOPY,BIOPSY N/A 07/17/2012    Procedure: UGI ENDOSCOPY; WITH BIOPSY, SINGLE OR MULTIPLE;  Surgeon: Alba Destine, MD;  Location: GI PROCEDURES MEMORIAL [facility];  Service: Gastroenterology   ??? PR UPPER GI ENDOSCOPY,DIAGNOSIS N/A 02/04/2014    Procedure: UGI ENDO, INCLUDE ESOPHAGUS, STOMACH, & DUODENUM &/OR JEJUNUM; DX W/WO COLLECTION SPECIMN, BY BRUSH OR WASH;  Surgeon: Wilburt Finlay, MD;  Location: GI PROCEDURES [facility];  Service: Gastroenterology   ??? PR UPPER GI ENDOSCOPY,LIGAT VARIX N/A 11/05/2013    Procedure: UGI ENDO; W/BAND LIG ESOPH &/OR GASTRIC VARICES;  Surgeon: Wilburt Finlay, MD;  Location: GI PROCEDURES [facility];  Service: Gastroenterology    Family History   Problem Relation Age of Onset   ??? Hypertension Mother    ??? Cirrhosis Neg Hx    ??? Liver cancer Neg Hx    ??? Anemia Neg Hx    ??? Cancer Neg Hx    ??? Diabetes Neg Hx    ??? Kidney disease Neg Hx    ??? Obesity Neg Hx    ??? Thyroid disease Neg Hx    ??? Osteoporosis Neg Hx    ??? Coronary artery disease Neg Hx    ??? Anesthesia problems Neg Hx         Allergies: Patient has no known allergies.                Objective Findings              Precautions: Falls              Weight Bearing Status: Non- applicable              Required Braces or Orthoses: Non- applicable    Communication Preference: Verbal  Pain Comments: Mild soreness at thoracentesis site  Medical Tests / Procedures: Imaging, labs reviewed. Thoracentesis 3/29  Equipment / Environment: Vascular access (PIV, TLC, Port-a-cath, PICC)                  Living environment: House  Lives With: Spouse  Home Living: One level home;Stairs to enter with rails        Number of Stairs: 7    Cognition: AO x 4             UE Strength: WFL     LE Strength: [facility]  Bed Mobility: supine to sit min A from elevated HOB  Transfers: sit to/from standing mod I, rollator   Gait: 1 x 250' SBA, rollator               Eval Duration(PT): 17 Min.    Medical Staff Made Aware: RN aware     I attest that I have reviewed the above information.  Signed: Starr Lake, PT  Filed 06/09/2017

## 2017-06-09 NOTE — Unmapped (Signed)
Patient seen in ED, please review admission note    I was the supervising physician in the delivery of the service. Doyce Loose, MD

## 2017-06-09 NOTE — Unmapped (Signed)
Transplant Surgery Progress Note    Hospital Day: 2    Assessment:     67 year old male w/ a h/o hepatitis C cirrhosis and HCC s/p OLT in 09/2016, paroxysmal afib on coumadin, HFpEF, PAH, implanted pacemaker, IDDM, and CBD stones s/p ERCP who was recently found to have multiple liver masses for which he underwent a biopsy w/ IR on 05/24/17 which was complicated by right sided hemothorax s/p pigtail placement in the ER by interventional pulmonology.  He represents to the ER 5 days after a likely mechanical fall at home and was found to have a right sided pleural effusion w/ continued 3 liver lesions concerning for abscess versus granulomas.  He has been afebrile, but his liver enzymes and bilirubin have been elevated.      Interval Events:     Patient doing well overnight, pain has been controlled. He was made NPO at midnight and will go with VIR for aspiration of pleural effusion today. They will not re-biopsy the liver lesions as they appear to be getting smaller and previous procedure was complicated by hemothorax. We will get PT/OT involved during patient's stay.    Plan:     Neuro:  - Pain well controlled  - Will wean IV pain meds for dispo planning  - Continue PO PRNs    CV:  - HDS  - On home meds    Pulm:  - Stable WOB  - Will go for drainage of pleural effusion today with VIR, send fluid for culture  - IS/pulm toilet given recent fall and rib pain    GI:  - NPO w/ IVF@50 /hr for procedure  - Regular diet post-procedure   - Bilirubin downtrending, will continue to follow  - Bowel regimen    GU:  - Voiding adequately  - Daily labs    Heme/ID:  - Afebrile without leukocytosis  - Infectious disease consult for possibility of empiric treatment of liver lesions/abscesses    Immuno:  - Continue Cyclosporine, Cellcept    - Dispo: Floor  Subjective:     Jeffrey Ward reports NAEON. Pain well controlled.     Objective:        Vital Signs:  BP 118/64  - Pulse 76  - Temp 36.8 ??C (Oral)  - Resp 15  - Ht 170.2 cm (5' 7)  - Wt 59 kg (130 lb)  - SpO2 100%  - BMI 20.36 kg/m??     Input/Output:  I/O last 3 completed shifts:  In: -   Out: 300 [Urine:300]    Physical Exam:    General: Cooperative, no distress, well appearing male  Pulmonary: Normal work of breathing, equal bilateral chest rise  Cardiovascular: Regular rate and rhythm  Abdomen: Soft, non-tender, non-distended.  Musculoskeletal: Good range of motion in bilateral upper/lower extremities.  Skin: No jaundice, rashes, erythema, or lesions. Skin color and texture normal.  Neurologic: Alert and interactive, no motor abnormalities noted. Sensation grossly intact.    Labs:  Lab Results   Component Value Date    WBC 5.2 06/09/2017    HGB 8.8 (L) 06/09/2017    HCT 27.8 (L) 06/09/2017    PLT 152 06/09/2017       Lab Results   Component Value Date    NA 135 06/09/2017    K 5.2 (H) 06/09/2017    CL 102 06/09/2017    CO2 28.0 06/09/2017    BUN 32 (H) 06/09/2017    CREATININE 1.42 (H) 06/09/2017  CALCIUM 9.2 06/09/2017    MG 1.7 06/09/2017    PHOS 3.7 06/09/2017       Microbiology Results (last day)     Procedure Component Value Date/Time Date/Time    Blood Culture #1 [[id number]] Collected:  06/08/17 1249    Lab Status:  In process Specimen:  Blood from Peripheral Draw Updated:  06/08/17 1337    Blood Culture #2 [[id number]] Collected:  06/08/17 1249    Lab Status:  In process Specimen:  Blood from Peripheral Draw Updated:  06/08/17 1337    Lower Respiratory Culture [[id number]]     Lab Status:  No result Specimen:  Sputum from SPUTUM EXPECTORATED           Imaging:  All pertinent imaging personally reviewed.

## 2017-06-09 NOTE — Unmapped (Signed)
VASCULAR INTERVENTIONAL RADIOLOGY  Drainage/Aspiration Consultation     Requesting Attending Physician: Doyce Loose,*  Service Requesting Consult: Surg Transplant [facility])    Date of Service: 06/09/2017  Consulting Interventional Radiologist: Dr. Claretta Fraise     HPI:     Procedure Requested: Aspiration  of right pleural effusion     Jeffrey Ward is a 67 y.o. male with history significant for hepatitis C cirrhosis and HCC s/p OLT in 09/2016, paroxysmal afib on coumadin, HFpEF, PAH, implanted pacemaker, IDDM,  found to have multiple liver masses for which he underwent a biopsy w/ IR on 05/24/17 which was complicated by right sided hemothorax.  He is currently readmitted s/p fall was found to have a right sided pleural effusion w/ continued 3 liver lesions concerning for abscess versus granulomas. Team requesting aspiration of right pleural effusion and possible repeat biopsy of liver lesions.        Medical History:     Past Medical History:  Past Medical History:   Diagnosis Date   ??? Cancer (CMS-HCC)    ??? Cirrhosis (CMS-HCC)    ??? Diabetes (CMS-HCC)    ??? Hepatitis C 07/17/2012   ??? Liver disease    ??? Low back pain 07/17/2012   ??? Varices, esophageal (CMS-HCC)        Surgical History:  Past Surgical History:   Procedure Laterality Date   ??? ANKLE SURGERY     ??? BACK SURGERY     ??? BACK SURGERY     ??? CHG US GUIDE, TISSUE ABLATION N/A 01/22/2016    Procedure: ULTRASOUND GUIDANCE FOR, AND MONITORING OF, PARENCHYMAL TISSUE ABLATION;  Surgeon: Particia Nearing, MD;  Location: MAIN OR [facility];  Service: Transplant   ??? IR EMBOLIZATION ORGAN ISCHEMIA, TUMORS, INFAR  06/16/2016    IR EMBOLIZATION ORGAN ISCHEMIA, TUMORS, INFAR 06/16/2016 Ammie Dalton, MD IMG VIR H&V [facility]   ??? PR COLON CA SCRN NOT HI RSK IND N/A 02/27/2015    Procedure: COLOREC CNCR SCR;COLNSCPY NO;  Surgeon: Vonda Antigua, MD;  Location: [facility];  Service: Gastroenterology   ??? PR ENDOSCOPIC ULTRASOUND EXAM N/A 02/27/2015    Procedure: UGI ENDO; W/ENDO ULTRASOUND EXAM INCLUDES ESOPHAGUS, STOMACH, &/OR DUODENUM/JEJUNUM;  Surgeon: Vonda Antigua, MD;  Location: [facility];  Service: Gastroenterology   ??? PR ERCP BALLOON DILATE BILIARY/PANC DUCT/AMPULLA EA N/A 02/10/2017    Procedure: ERCP;WITH TRANS-ENDOSCOPIC BALLOON DILATION OF BILIARY/PANCREATIC DUCT(S) OR OF AMPULLA, INCLUDING SPHINCTERECTOMY, WHEN PERFOREMD,EACH DUCT ([id number]);  Surgeon: Mayford Knife, MD;  Location: [facility];  Service: Gastroenterology   ??? PR ERCP REMOVE FOREIGN BODY/STENT BILIARY/PANC DUCT N/A 02/10/2017    Procedure: ENDOSCOPIC RETROGRADE CHOLANGIOPANCREATOGRAPHY (ERCP); W/ REMOVAL OF FOREIGN BODY/STENT FROM BILIARY/PANCREATIC DUCT(S);  Surgeon: Mayford Knife, MD;  Location: [facility]URES [facility];  Service: Gastroenterology   ??? PR ERCP STENT PLACEMENT BILIARY/PANCREATIC DUCT N/A 11/29/2016    Procedure: ENDOSCOPIC RETROGRADE CHOLANGIOPANCREATOGRAPHY (ERCP); WITH PLACEMENT OF ENDOSCOPIC STENT INTO BILIARY OR PANCREATIC DUCT;  Surgeon: Chriss Driver, MD;  Location: [facility];  Service: Gastroenterology   ??? PR ERCP STENT PLACEMENT BILIARY/PANCREATIC DUCT N/A 04/26/2017    Procedure: ENDOSCOPIC RETROGRADE CHOLANGIOPANCREATOGRAPHY (ERCP); WITH PLACEMENT OF ENDOSCOPIC STENT INTO BILIARY OR PANCREATIC DUCT;  Surgeon: Mayford Knife, MD;  Location: [facility]URES [facility];  Service: Gastroenterology   ??? PR ERCP,W/REMOVAL STONE,BIL/PANCR DUCTS N/A 11/29/2016    Procedure: ERCP; W/ENDOSCOPIC RETROGRADE REMOVAL OF CALCULUS/CALCULI FROM BILIARY &/OR PANCREATIC DUCTS;  Surgeon:  Chriss Driver, MD;  Location: [facility]URES [facility];  Service: Gastroenterology   ??? PR ERCP,W/REMOVAL STONE,BIL/PANCR DUCTS N/A 02/10/2017    Procedure: ERCP; W/ENDOSCOPIC RETROGRADE REMOVAL OF CALCULUS/CALCULI FROM BILIARY &/OR PANCREATIC DUCTS;  Surgeon: Mayford Knife, MD;  Location: GI PROCEDURES [facility];  Service: Gastroenterology   ??? PR ERCP,W/REMOVAL STONE,BIL/PANCR DUCTS N/A 04/26/2017    Procedure: ERCP; W/ENDOSCOPIC RETROGRADE REMOVAL OF CALCULUS/CALCULI FROM BILIARY &/OR PANCREATIC DUCTS;  Surgeon: Mayford Knife, MD;  Location: [facility];  Service: Gastroenterology   ??? PR INSER HEART TEMP PACER ONE CHMBR N/A 10/02/2016    Procedure: Tempoarary Pacemaker Insertion;  Surgeon: Meredith Leeds, MD;  Location: [facility];  Service: Cardiology   ??? PR LAP,DIAGNOSTIC ABDOMEN N/A 01/22/2016    Procedure: Laparoscopy, Abdomen, Peritoneum, & Omentum, Diagnostic, W/Wo Collection Specimen(S) By Brushing Or Washing;  Surgeon: Particia Nearing, MD;  Location: MAIN OR [facility];  Service: Transplant   ??? PR PLACE PERCUT GASTROSTOMY TUBE N/A 11/17/2016    Procedure: UGI ENDO; W/DIRECTED PLCMT PERQ GASTROSTOMY TUBE;  Surgeon: Cletis Athens, MD;  Location: [facility]URES [facility];  Service: Gastroenterology   ??? PR TRACHEOSTOMY, PLANNED N/A 09/29/2016    Procedure: TRACHEOSTOMY PLANNED (SEPART PROC);  Surgeon: Katherina Mires, MD;  Location: MAIN OR [facility];  Service: Trauma   ??? PR TRANSCATH INSERT OR REPLACE LEADLESS PM VENTR N/A 10/11/2016    Procedure: Pacemaker Implant/Replace Leadless;  Surgeon: Meredith Leeds, MD;  Location: [facility];  Service: Cardiology   ??? PR TRANSPLANT LIVER,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    Procedure: Liver Allotransplantation; Orthotopic, Partial Or Whole, From Cadaver Or Living Donor, Any Age;  Surgeon: Doyce Loose, MD;  Location: MAIN OR [facility];  Service: Transplant   ??? PR TRANSPLANT,PREP DONOR LIVER, WHOLE N/A 09/15/2016    Procedure: Rogelia Boga Std Prep Cad Donor Whole Liver Gft Prior Tnsplnt,Inc Chole,Diss/Rem Surr Tissu Wo Triseg/Lobe Splt;  Surgeon: Doyce Loose, MD;  Location: MAIN OR [facility];  Service: Transplant   ??? PR UPPER GI ENDOSCOPY,BIOPSY N/A 07/17/2012    Procedure: UGI ENDOSCOPY; WITH BIOPSY, SINGLE OR MULTIPLE;  Surgeon: Alba Destine, MD; Location: GI PROC[facility]URES [facility];  Service: Gastroenterology   ??? PR UPPER GI ENDOSCOPY,DIAGNOSIS N/A 02/04/2014    Procedure: UGI ENDO, INCLUDE ESOPHAGUS, STOMACH, & DUODENUM &/OR JEJUNUM; DX W/WO COLLECTION SPECIMN, BY BRUSH OR WASH;  Surgeon: Wilburt Finlay, MD;  Location: [facility]URES [facility];  Service: Gastroenterology   ??? PR UPPER GI ENDOSCOPY,LIGAT VARIX N/A 11/05/2013    Procedure: UGI ENDO; W/BAND LIG ESOPH &/OR GASTRIC VARICES;  Surgeon: Wilburt Finlay, MD;  Location: [facility]URES [facility];  Service: Gastroenterology       Family History:  Family History   Problem Relation Age of Onset   ??? Hypertension Mother    ??? Cirrhosis Neg Hx    ??? Liver cancer Neg Hx    ??? Anemia Neg Hx    ??? Cancer Neg Hx    ??? Diabetes Neg Hx    ??? Kidney disease Neg Hx    ??? Obesity Neg Hx    ??? Thyroid disease Neg Hx    ??? Osteoporosis Neg Hx    ??? Coronary artery disease Neg Hx    ??? Anesthesia problems Neg Hx        Medications:   Current Facility-Administered Medications   Medication Dose Route Frequency Provider Last Rate Last Dose   ??? cycloSPORINE modified (NEORAL) capsule 175 mg  175 mg Oral BID Irwin Brakeman, MD   175 mg at 06/09/17 0981   ??? dextrose 50 % in water (D50W) 50 % solution 12.5 g  12.5 g Intravenous Q10 Min PRN Irwin Brakeman, MD       ??? digoxin (LANOXIN) tablet 62.5 mcg  62.5 mcg Oral Daily Irwin Brakeman, MD       ??? docusate sodium (COLACE) capsule 100 mg  100 mg Oral BID Irwin Brakeman, MD   100 mg at 06/08/17 2015   ??? fentaNYL (PF) (SUBLIMAZE) injection 50 mcg  50 mcg Intravenous Q4H PRN Irwin Brakeman, MD       ??? heparin (porcine) injection 5,000 Units  5,000 Units Subcutaneous [facility] Irwin Brakeman, MD   5,000 Units at 06/09/17 0622   ??? insulin lispro (HumaLOG) injection 6 Units  6 Units Subcutaneous TID [facility] Irwin Brakeman, MD   Stopped at 06/09/17 [phone]   ??? insulin regular (HumuLIN,NovoLIN) injection 0-12 Units  0-12 Units Subcutaneous ACHS Irwin Brakeman, MD   2 Units at 06/08/17 2104   ??? melatonin tablet 3 mg  3 mg Oral Nightly Irwin Brakeman, MD   3 mg at 06/08/17 2014   ??? mirtazapine (REMERON) tablet 15 mg  15 mg Oral Nightly Irwin Brakeman, MD   15 mg at 06/08/17 2014   ??? mycophenolate (CELLCEPT) capsule 500 mg  500 mg Oral BID Irwin Brakeman, MD   500 mg at 06/09/17 7829   ??? ondansetron (ZOFRAN-ODT) disintegrating tablet 4 mg  4 mg Oral Q8H PRN Irwin Brakeman, MD       ??? oxyCODONE (ROXICODONE) immediate release tablet 5 mg  5 mg Oral Q6H PRN Irwin Brakeman, MD   5 mg at 06/08/17 2015   ??? pantoprazole (PROTONIX) EC tablet 20 mg  20 mg Oral Daily Irwin Brakeman, MD       ??? polyethylene glycol (MIRALAX) packet 17 g  17 g Oral Daily Irwin Brakeman, MD       ??? sodium chloride (NS) 0.9 % infusion  50 mL/hr Intravenous Continuous Irwin Brakeman, MD 50 mL/hr at 06/08/17 2015 50 mL/hr at 06/08/17 2015       Allergies:  Patient has no known allergies.    Social History:  Social History   Substance Use Topics   ??? Smoking status: Never Smoker   ??? Smokeless tobacco: Never Used      Comment: Smoked in high school for about 5 years.    ??? Alcohol use No       Objective:    Pertinent Laboratory Values:  WBC   Date Value Ref Range Status   06/09/2017 5.2 4.5 - 11.0 10*9/L Final   01/09/2014 2.4 (L) 4.5 - 11.0 10*9/L Final     HGB   Date Value Ref Range Status   06/09/2017 8.8 (L) 13.5 - 17.5 g/dL Final     Hemoglobin   Date Value Ref Range Status   09/26/2016 6.2 (L) 13.5 - 17.5 g/dL Final     Comment:     Point of Care Testing performed at the point of care by trained personnel per documented policies.     HCT   Date Value Ref Range Status   06/09/2017 27.8 (L) 41.0 - 53.0 % Final   01/09/2014 35.4 (L) 41.0 - 53.0 % Final     Platelet   Date Value Ref Range Status   06/09/2017 152 150 - 440 10*9/L Final   01/09/2014 35 (L) 150 - 440 10*9/L Final  INR   Date Value Ref Range Status   06/09/2017 1.71  Final   01/09/2014 1.3  Final     Creatinine Whole Blood, POC   Date Value Ref Range Status   09/25/2015 1.0 0.8 - 1.4 mg/dL Final     Creatinine   Date Value Ref Range Status   06/09/2017 1.42 (H) 0.70 - 1.30 mg/dL Final       Imaging Reviewed:  CT Abdomen/Pelvis reviewed by Dr. Claretta Fraise     Febrile:  No    Anticoaguation: Yes  If yes, type of anticoagulation Warfarin (Coumadin) held per team     Physical Exam:    Vitals:    06/09/17 0740   BP: 129/68   Pulse: 73   Resp: 18   Temp: 36.8 ??C (98.2 ??F)   SpO2: 100%     ASA Grade: ASA 2 - Patient with mild systemic disease with no functional limitations  General: No apparent distress.  Lungs: Breathing comfortably on 2L [place] .  Heart:  Regular rate and rhythm.  Neuro: No obvious focal deficits.    Airway assessment: Class 2 - Can visualize soft palate and fauces, tip of uvula is obscured    Assessment and Recommendations:     Jeffrey Ward is a 67 y.o. male with history significant for hepatitis C cirrhosis and HCC s/p OLT in 09/2016, paroxysmal afib on coumadin, HFpEF, PAH, implanted pacemaker, IDDM,  found to have multiple liver masses for which he underwent a biopsy w/ IR on 05/24/17 which was complicated by right sided hemothorax.  He is currently readmitted s/p fall was found to have a right sided pleural effusion w/ continued 3 liver lesions concerning for abscess versus granulomas.     Pathology report of previous biopsy reveals granulation tissue; though specimen was not sent for culture at that time, we do not recommend proceeding with repeat biopsy at this time given high risk of complication.  We recommend proceeding with aspiration of right pleural effusion and sending specimen from culture.  Pending pleural results may consider re biopsy of liver lesions for culture in the future.        Recommendations:  - VIR does recommend proceeding with Aspiration  of right pleural effusion.  The procedure will be performed with Ultrasound guidance.    - Anticipated procedure date: today or tomorrow   - Please make NPO night prior to procedure  - Please ensure recent CBC, Creatinine, and INR are available    Informed Consent:  This procedure has been fully reviewed with the patient/patient???s authorized representative. The risks, benefits and alternatives have been explained, and the patient/patient???s authorized representative has consented to the procedure.  --The patient will accept blood products in an emergent situation.  --The patient does not have a Do Not Resuscitate order in effect.    The patient was discussed with Dr. Claretta Fraise.     Thank you for involving [place] in the care of this patient. Please page the VIR consult pager [phone]) with further questions, concerns, or if new issues arise.  Marland Kitchen

## 2017-06-10 DIAGNOSIS — R079 Chest pain, unspecified: Principal | ICD-10-CM

## 2017-06-10 DIAGNOSIS — T148XXA Other injury of unspecified body region, initial encounter: Principal | ICD-10-CM

## 2017-06-10 LAB — COMPREHENSIVE METABOLIC PANEL
ALBUMIN: 2.4 g/dL — ABNORMAL LOW (ref 3.5–5.0)
ALBUMIN: 2.5 g/dL — ABNORMAL LOW (ref 3.5–5.0)
ALKALINE PHOSPHATASE: 340 U/L — ABNORMAL HIGH (ref 38–126)
ALKALINE PHOSPHATASE: 385 U/L — ABNORMAL HIGH (ref 38–126)
ALT (SGPT): 126 U/L — ABNORMAL HIGH (ref 19–72)
ALT (SGPT): 116 U/L — ABNORMAL HIGH (ref 19–72)
ANION GAP: 6 mmol/L — ABNORMAL LOW (ref 9–15)
AST (SGOT): 38 U/L (ref 19–55)
BILIRUBIN TOTAL: 0.8 mg/dL (ref 0.0–1.2)
BILIRUBIN TOTAL: 0.8 mg/dL (ref 0.0–1.2)
BLOOD UREA NITROGEN: 35 mg/dL — ABNORMAL HIGH (ref 7–21)
BLOOD UREA NITROGEN: 30 mg/dL — ABNORMAL HIGH (ref 7–21)
BUN / CREAT RATIO: 23
BUN / CREAT RATIO: 21
CALCIUM: 8.8 mg/dL (ref 8.5–10.2)
CALCIUM: 8.8 mg/dL (ref 8.5–10.2)
CHLORIDE: 101 mmol/L (ref 98–107)
CHLORIDE: 102 mmol/L (ref 98–107)
CO2: 24 mmol/L (ref 22.0–30.0)
CO2: 26 mmol/L (ref 22.0–30.0)
CREATININE: 1.51 mg/dL — ABNORMAL HIGH (ref 0.70–1.30)
CREATININE: 1.43 mg/dL — ABNORMAL HIGH (ref 0.70–1.30)
EGFR MDRD AF AMER: 56 mL/min/{1.73_m2} — ABNORMAL LOW (ref >=60)
EGFR MDRD AF AMER: 59 mL/min/{1.73_m2} — ABNORMAL LOW (ref >=60)
EGFR MDRD NON AF AMER: 46 mL/min/{1.73_m2} — ABNORMAL LOW (ref >=60)
EGFR MDRD NON AF AMER: 49 mL/min/{1.73_m2} — ABNORMAL LOW (ref >=60)
GLUCOSE RANDOM: 167 mg/dL (ref 65–179)
POTASSIUM: 4.9 mmol/L (ref 3.5–5.0)
POTASSIUM: 5 mmol/L (ref 3.5–5.0)
PROTEIN TOTAL: 4.6 g/dL — ABNORMAL LOW (ref 6.5–8.3)
PROTEIN TOTAL: 4.8 g/dL — ABNORMAL LOW (ref 6.5–8.3)
SODIUM: 131 mmol/L — ABNORMAL LOW (ref 135–145)
SODIUM: 133 mmol/L — ABNORMAL LOW (ref 135–145)

## 2017-06-10 LAB — CBC W/ AUTO DIFF
BASOPHILS ABSOLUTE COUNT: 0 10*9/L (ref 0.0–0.1)
BASOPHILS RELATIVE PERCENT: 0.4 %
EOSINOPHILS ABSOLUTE COUNT: 0.1 10*9/L (ref 0.0–0.4)
HEMATOCRIT: 25.7 % — ABNORMAL LOW (ref 41.0–53.0)
HEMOGLOBIN: 8 g/dL — ABNORMAL LOW (ref 13.5–17.5)
LARGE UNSTAINED CELLS: 2 % (ref 0–4)
LYMPHOCYTES ABSOLUTE COUNT: 0.5 10*9/L — ABNORMAL LOW (ref 1.5–5.0)
LYMPHOCYTES RELATIVE PERCENT: 14.7 %
MEAN CORPUSCULAR HEMOGLOBIN CONC: 30.9 g/dL — ABNORMAL LOW (ref 31.0–37.0)
MEAN CORPUSCULAR HEMOGLOBIN: 28.4 pg (ref 26.0–34.0)
MEAN CORPUSCULAR VOLUME: 91.7 fL (ref 80.0–100.0)
MEAN PLATELET VOLUME: 8.9 fL (ref 7.0–10.0)
MONOCYTES RELATIVE PERCENT: 7.7 %
NEUTROPHILS ABSOLUTE COUNT: 2.3 10*9/L (ref 2.0–7.5)
NEUTROPHILS RELATIVE PERCENT: 73.1 %
PLATELET COUNT: 132 10*9/L — ABNORMAL LOW (ref 150–440)
RED BLOOD CELL COUNT: 2.8 10*12/L — ABNORMAL LOW (ref 4.50–5.90)
RED CELL DISTRIBUTION WIDTH: 16 % — ABNORMAL HIGH (ref 12.0–15.0)
WBC ADJUSTED: 3.2 10*9/L — ABNORMAL LOW (ref 4.5–11.0)

## 2017-06-10 LAB — BUN / CREAT RATIO: Urea nitrogen/Creatinine:MRto:Pt:Ser/Plas:Qn:: 23

## 2017-06-10 LAB — MAGNESIUM
Magnesium:MCnc:Pt:Ser/Plas:Qn:: 1.7
Magnesium:MCnc:Pt:Ser/Plas:Qn:: 2.3 — ABNORMAL HIGH

## 2017-06-10 LAB — PHOSPHORUS
PHOSPHORUS: 3.2 mg/dL (ref 2.9–4.7)
Phosphate:MCnc:Pt:Ser/Plas:Qn:: 2.2 — ABNORMAL LOW
Phosphate:MCnc:Pt:Ser/Plas:Qn:: 3.2

## 2017-06-10 LAB — CREATININE: Creatinine:MCnc:Pt:Ser/Plas:Qn:: 1.43 — ABNORMAL HIGH

## 2017-06-10 LAB — CBC
HEMATOCRIT: 27.5 % — ABNORMAL LOW (ref 41.0–53.0)
HEMOGLOBIN: 8.5 g/dL — ABNORMAL LOW (ref 13.5–17.5)
MEAN CORPUSCULAR HEMOGLOBIN CONC: 31 g/dL (ref 31.0–37.0)
MEAN CORPUSCULAR HEMOGLOBIN: 28.3 pg (ref 26.0–34.0)
MEAN CORPUSCULAR VOLUME: 91.1 fL (ref 80.0–100.0)
MEAN PLATELET VOLUME: 8.5 fL (ref 7.0–10.0)
PLATELET COUNT: 140 10*9/L — ABNORMAL LOW (ref 150–440)
RED BLOOD CELL COUNT: 3.02 10*12/L — ABNORMAL LOW (ref 4.50–5.90)
WBC ADJUSTED: 3 10*9/L — ABNORMAL LOW (ref 4.5–11.0)

## 2017-06-10 LAB — HEMOGLOBIN: Lab: 8 — ABNORMAL LOW

## 2017-06-10 LAB — MEAN CORPUSCULAR HEMOGLOBIN CONC: Lab: 31

## 2017-06-10 LAB — PROTIME-INR: INR: 1.7

## 2017-06-10 LAB — PROTIME: Lab: 19.4 — ABNORMAL HIGH

## 2017-06-10 NOTE — Unmapped (Deleted)
VASCULAR INTERVENTIONAL RADIOLOGY  Drainage/Aspiration Consultation     Requesting Attending Physician: Doyce Loose,*  Service Requesting Consult: Surg Transplant [facility])    Date of Service: 06/09/2017  Consulting Interventional Radiologist: Dr. Ammie Dalton     HPI:     Procedure Requested: Aspiration  of hepatic lesions    Jeffrey Ward is a 67 y.o. male with a history significant for hepatitis C cirrhosis and HCC s/p OLT in 09/2016, paroxysmal afib on coumadin, HFpEF, PAH, implanted pacemaker, IDDM,  found to have multiple liver masses for which he underwent a biopsy w/ IR on 05/24/17 which was complicated by right sided hemothorax.  He is currently readmitted s/p fall was found to have a right sided pleural effusion w/ continued 3 liver lesions concerning for abscess versus granulomas. IR performed thoracentesis on 06/09/17 with pleural fluid sent for culture. Consulted for re-biopsy/aspiration of hepatic lesions.        Medical History:     Past Medical History:  Past Medical History:   Diagnosis Date   ??? Cancer (CMS-HCC)    ??? Cirrhosis (CMS-HCC)    ??? Diabetes (CMS-HCC)    ??? Hepatitis C 07/17/2012   ??? Liver disease    ??? Low back pain 07/17/2012   ??? Varices, esophageal (CMS-HCC)        Surgical History:  Past Surgical History:   Procedure Laterality Date   ??? ANKLE SURGERY     ??? BACK SURGERY     ??? BACK SURGERY     ??? CHG US GUIDE, TISSUE ABLATION N/A 01/22/2016    Procedure: ULTRASOUND GUIDANCE FOR, AND MONITORING OF, PARENCHYMAL TISSUE ABLATION;  Surgeon: Particia Nearing, MD;  Location: MAIN OR [facility];  Service: Transplant   ??? IR EMBOLIZATION ORGAN ISCHEMIA, TUMORS, INFAR  06/16/2016    IR EMBOLIZATION ORGAN ISCHEMIA, TUMORS, INFAR 06/16/2016 Ammie Dalton, MD IMG VIR [facility]   ??? PR COLON CA SCRN NOT HI RSK IND N/A 02/27/2015    Procedure: COLOREC CNCR SCR;COLNSCPY NO;  Surgeon: Vonda Antigua, MD;  Location: GI PROCEDURES [facility];  Service: Gastroenterology   ??? PR ENDOSCOPIC ULTRASOUND EXAM N/A 02/27/2015 Procedure: UGI ENDO; W/ENDO ULTRASOUND EXAM INCLUDES ESOPHAGUS, STOMACH, &/OR DUODENUM/JEJUNUM;  Surgeon: Vonda Antigua, MD;  Location: GI PROCEDURES MEMORIAL [facility];  Service: Gastroenterology   ??? PR ERCP BALLOON DILATE BILIARY/PANC DUCT/AMPULLA EA N/A 02/10/2017    Procedure: ERCP;WITH TRANS-ENDOSCOPIC BALLOON DILATION OF BILIARY/PANCREATIC DUCT(S) OR OF AMPULLA, INCLUDING SPHINCTERECTOMY, WHEN PERFOREMD,EACH DUCT ([id number]);  Surgeon: Mayford Knife, MD;  Location: GI PROCEDURES [facility];  Service: Gastroenterology   ??? PR ERCP REMOVE FOREIGN BODY/STENT BILIARY/PANC DUCT N/A 02/10/2017    Procedure: ENDOSCOPIC RETROGRADE CHOLANGIOPANCREATOGRAPHY (ERCP); W/ REMOVAL OF FOREIGN BODY/STENT FROM BILIARY/PANCREATIC DUCT(S);  Surgeon: Mayford Knife, MD;  Location: GI PROCEDURES MEMORIAL [facility];  Service: Gastroenterology   ??? PR ERCP STENT PLACEMENT BILIARY/PANCREATIC DUCT N/A 11/29/2016    Procedure: ENDOSCOPIC RETROGRADE CHOLANGIOPANCREATOGRAPHY (ERCP); WITH PLACEMENT OF ENDOSCOPIC STENT INTO BILIARY OR PANCREATIC DUCT;  Surgeon: Chriss Driver, MD;  Location: GI PROCEDURES [facility];  Service: Gastroenterology   ??? PR ERCP STENT PLACEMENT BILIARY/PANCREATIC DUCT N/A 04/26/2017    Procedure: ENDOSCOPIC RETROGRADE CHOLANGIOPANCREATOGRAPHY (ERCP); WITH PLACEMENT OF ENDOSCOPIC STENT INTO BILIARY OR PANCREATIC DUCT;  Surgeon: Mayford Knife, MD;  Location: GI PROCEDURES MEMORIAL [facility];  Service: Gastroenterology   ??? PR ERCP,W/REMOVAL STONE,BIL/PANCR DUCTS N/A 11/29/2016    Procedure: ERCP; W/ENDOSCOPIC RETROGRADE REMOVAL OF CALCULUS/CALCULI FROM BILIARY &/OR PANCREATIC DUCTS;  Surgeon: Misty Stanley  Gust Rung, MD;  Location: GI PROCEDURES [facility];  Service: Gastroenterology   ??? PR ERCP,W/REMOVAL STONE,BIL/PANCR DUCTS N/A 02/10/2017    Procedure: ERCP; W/ENDOSCOPIC RETROGRADE REMOVAL OF CALCULUS/CALCULI FROM BILIARY &/OR PANCREATIC DUCTS;  Surgeon: Mayford Knife, MD;  Location: GI PROCEDURES MEMORIAL [facility]; Service: Gastroenterology   ??? PR ERCP,W/REMOVAL STONE,BIL/PANCR DUCTS N/A 04/26/2017    Procedure: ERCP; W/ENDOSCOPIC RETROGRADE REMOVAL OF CALCULUS/CALCULI FROM BILIARY &/OR PANCREATIC DUCTS;  Surgeon: Mayford Knife, MD;  Location: GI PROCEDURES MEMORIAL [facility];  Service: Gastroenterology   ??? PR INSER HEART TEMP PACER ONE CHMBR N/A 10/02/2016    Procedure: Tempoarary Pacemaker Insertion;  Surgeon: Meredith Leeds, MD;  Location: [facility];  Service: Cardiology   ??? PR LAP,DIAGNOSTIC ABDOMEN N/A 01/22/2016    Procedure: Laparoscopy, Abdomen, Peritoneum, & Omentum, Diagnostic, W/Wo Collection Specimen(S) By Brushing Or Washing;  Surgeon: Particia Nearing, MD;  Location: MAIN OR [facility];  Service: Transplant   ??? PR PLACE PERCUT GASTROSTOMY TUBE N/A 11/17/2016    Procedure: UGI ENDO; W/DIRECTED PLCMT PERQ GASTROSTOMY TUBE;  Surgeon: Cletis Athens, MD;  Location: GI PROCEDURES MEMORIAL [facility];  Service: Gastroenterology   ??? PR TRACHEOSTOMY, PLANNED N/A 09/29/2016    Procedure: TRACHEOSTOMY PLANNED (SEPART PROC);  Surgeon: Katherina Mires, MD;  Location: MAIN OR [facility];  Service: Trauma   ??? PR TRANSCATH INSERT OR REPLACE LEADLESS PM VENTR N/A 10/11/2016    Procedure: Pacemaker Implant/Replace Leadless;  Surgeon: Meredith Leeds, MD;  Location: [facility];  Service: Cardiology   ??? PR TRANSPLANT LIVER,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    Procedure: Liver Allotransplantation; Orthotopic, Partial Or Whole, From Cadaver Or Living Donor, Any Age;  Surgeon: Doyce Loose, MD;  Location: MAIN OR [facility];  Service: Transplant   ??? PR TRANSPLANT,PREP DONOR LIVER, WHOLE N/A 09/15/2016    Procedure: Rogelia Boga Std Prep Cad Donor Whole Liver Gft Prior Tnsplnt,Inc Chole,Diss/Rem Surr Tissu Wo Triseg/Lobe Splt;  Surgeon: Doyce Loose, MD;  Location: MAIN OR [facility];  Service: Transplant   ??? PR UPPER GI ENDOSCOPY,BIOPSY N/A 07/17/2012    Procedure: UGI ENDOSCOPY; WITH BIOPSY, SINGLE OR MULTIPLE;  Surgeon: Alba Destine, MD; Location: GI PROCEDURES MEMORIAL [facility];  Service: Gastroenterology   ??? PR UPPER GI ENDOSCOPY,DIAGNOSIS N/A 02/04/2014    Procedure: UGI ENDO, INCLUDE ESOPHAGUS, STOMACH, & DUODENUM &/OR JEJUNUM; DX W/WO COLLECTION SPECIMN, BY BRUSH OR WASH;  Surgeon: Wilburt Finlay, MD;  Location: GI PROCEDURES [facility];  Service: Gastroenterology   ??? PR UPPER GI ENDOSCOPY,LIGAT VARIX N/A 11/05/2013    Procedure: UGI ENDO; W/BAND LIG ESOPH &/OR GASTRIC VARICES;  Surgeon: Wilburt Finlay, MD;  Location: GI PROCEDURES MEMOR[facility];  Service: Gastroenterology       Family History:  Family History   Problem Relation Age of Onset   ??? Hypertension Mother    ??? Cirrhosis Neg Hx    ??? Liver cancer Neg Hx    ??? Anemia Neg Hx    ??? Cancer Neg Hx    ??? Diabetes Neg Hx    ??? Kidney disease Neg Hx    ??? Obesity Neg Hx    ??? Thyroid disease Neg Hx    ??? Osteoporosis Neg Hx    ??? Coronary artery disease Neg Hx    ??? Anesthesia problems Neg Hx        Medications:   Current Facility-Administered Medications   Medication Dose Route Frequency Provider Last Rate Last Dose   ??? ciprofloxacin HCl (CIPRO) tablet 500 mg  500 mg  Oral Q12H [facility] Jennifer Shur[place], [place]       ??? cycloSPORINE modified (NEORAL) capsule 175 mg  175 mg Oral BID Irwin Brakeman, MD   175 mg at 06/09/17 1725   ??? dextrose 50 % in water (D50W) 50 % solution 12.5 g  12.5 g Intravenous Q10 Min PRN Irwin Brakeman, MD       ??? digoxin (LANOXIN) tablet 62.5 mcg  62.5 mcg Oral Daily Irwin Brakeman, MD   62.5 mcg at 06/09/17 1610   ??? docusate sodium (COLACE) capsule 100 mg  100 mg Oral BID Irwin Brakeman, MD   Stopped at 06/09/17 0920   ??? fentaNYL (PF) (SUBLIMAZE) injection 50 mcg  50 mcg Intravenous Q4H PRN Irwin Brakeman, MD       ??? heparin (porcine) injection 5,000 Units  5,000 Units Subcutaneous [facility] Irwin Brakeman, MD   5,000 Units at 06/09/17 1537   ??? insulin lispro (HumaLOG) injection 6 Units  6 Units Subcutaneous TID [facility] Irwin Brakeman, MD   6 Units at 06/09/17 1759   ??? insulin regular (HumuLIN,NovoLIN) injection 0-12 Units  0-12 Units Subcutaneous ACHS Irwin Brakeman, MD   Stopped at 06/09/17 1718   ??? lidocaine (LIDODERM) 5 % patch 1 patch  1 patch Transdermal Daily Belva Chimes [place], [place]   Stopped at 06/09/17 1017   ??? melatonin tablet 3 mg  3 mg Oral Nightly Irwin Brakeman, MD   3 mg at 06/08/17 2014   ??? metroNIDAZOLE (FLAGYL) tablet 500 mg  500 mg Oral TID Drake Leach, PA   500 mg at 06/09/17 1724   ??? mirtazapine (REMERON) tablet 15 mg  15 mg Oral Nightly Irwin Brakeman, MD   15 mg at 06/08/17 2014   ??? mycophenolate (CELLCEPT) capsule 500 mg  500 mg Oral BID Irwin Brakeman, MD   500 mg at 06/09/17 1724   ??? ondansetron (ZOFRAN-ODT) disintegrating tablet 4 mg  4 mg Oral Q8H PRN Irwin Brakeman, MD       ??? oxyCODONE (ROXICODONE) immediate release tablet 5 mg  5 mg Oral Q6H PRN Irwin Brakeman, MD   5 mg at 06/09/17 1237   ??? pantoprazole (PROTONIX) EC tablet 20 mg  20 mg Oral Daily Irwin Brakeman, MD   20 mg at 06/09/17 9604   ??? polyethylene glycol (MIRALAX) packet 17 g  17 g Oral Daily Irwin Brakeman, MD       ??? sildenafil (antihypertensive) (REVATIO) tablet 40 mg  40 mg Oral TID Drake Leach, PA   40 mg at 06/09/17 1534   ??? sodium chloride (NS) 0.9 % infusion  50 mL/hr Intravenous Continuous Irwin Brakeman, MD 50 mL/hr at 06/08/17 2015 50 mL/hr at 06/08/17 2015       Allergies:  Patient has no known allergies.    Social History:  Social History   Substance Use Topics   ??? Smoking status: Never Smoker   ??? Smokeless tobacco: Never Used      Comment: Smoked in high school for about 5 years.    ??? Alcohol use No       Objective:    Pertinent Laboratory Values:  WBC   Date Value Ref Range Status   06/09/2017 5.2 4.5 - 11.0 10*9/L Final   01/09/2014 2.4 (L) 4.5 - 11.0 10*9/L Final     HGB   Date Value Ref Range Status   06/09/2017 8.8 (L) 13.5 - 17.5 g/dL Final     Hemoglobin   Date Value Ref Range  Status   09/26/2016 6.2 (L) 13.5 - 17.5 g/dL Final     Comment:     Point of Care Testing performed at the point of care by trained personnel per documented policies.     HCT   Date Value Ref Range Status   06/09/2017 27.8 (L) 41.0 - 53.0 % Final   01/09/2014 35.4 (L) 41.0 - 53.0 % Final     Platelet   Date Value Ref Range Status   06/09/2017 152 150 - 440 10*9/L Final   01/09/2014 35 (L) 150 - 440 10*9/L Final     INR   Date Value Ref Range Status   06/09/2017 1.71  Final   01/09/2014 1.3  Final     Creatinine Whole Blood, POC   Date Value Ref Range Status   09/25/2015 1.0 0.8 - 1.4 mg/dL Final     Creatinine   Date Value Ref Range Status   06/09/2017 1.42 (H) 0.70 - 1.30 mg/dL Final       Imaging Reviewed:  CT abdomen pelvis 06/08/17.    Febrile:  No    Anticoaguation: SQ heparin    Physical Exam:    Vitals:    06/09/17 1525   BP: 129/61   Pulse: 84   Resp: 16   Temp: 36.8 ??C (98.2 ??F)   SpO2: 99%     ASA Grade: ASA 2 - Patient with mild systemic disease with no functional limitations  General: No apparent distress.  Lungs: Breathing comfortably on room air.  Heart:  Regular rate and rhythm.  Neuro: No obvious focal deficits.    Airway assessment: Class 2 - Can visualize soft palate and fauces, tip of uvula is obscured    Assessment and Recommendations:     Jeffrey Ward is a 67 y.o. male with a history significant for hepatitis C cirrhosis and HCC s/p OLT in 09/2016, paroxysmal afib on coumadin, HFpEF, PAH, implanted pacemaker, IDDM,  found to have multiple liver masses for which he underwent a biopsy w/ IR on 05/24/17 which was complicated by right sided hemothorax.  He is currently readmitted s/p fall was found to have a right sided pleural effusion w/ continued 3 liver lesions concerning for abscess versus granulomas. IR performed thoracentesis on 06/09/17 with pleural fluid sent for culture. Consulted for re-biopsy/aspiration of hepatic lesions    Recommendations:  - VIR does recommend proceeding with aspiration/biopsy of hepatic lesions. Will perform pre-procedure scan with ultrasound to evaluate possible ultrasound guided approach. Likely will need to perform procedure with CT guidance.   - Anticipated procedure date: 06/10/17  - Please make NPO night prior to procedure  - Please ensure recent CBC, Creatinine, and INR are available    Informed Consent:  This procedure has been fully reviewed with the patient/patient???s authorized representative. The risks, benefits and alternatives have been explained, and the patient/patient???s authorized representative has consented to the procedure.  --The patient will accept blood products in an emergent situation.  --The patient does not have a Do Not Resuscitate order in effect.    The patient was discussed with Dr. Ammie Dalton.     Thank you for involving [place] in the care of this patient. Please page the VIR consult pager [phone]) with further questions, concerns, or if new issues arise.  Marland Kitchen

## 2017-06-10 NOTE — Unmapped (Signed)
[facility] Interventional Radiology  Post-procedure Note    Patient: Jeffrey Ward    DOB: 1950/11/17  Medical Record Number: [id number]   Note Date/Time: June 10, 2017 10:44 AM     Procedure: CT guided aspiration    Diagnosis:  Hepatic fluid collection    Attending: Dr. Ammie Dalton     Fellow/Resident: Dr. Orlando Penner    Time out: Prior to the procedure, a time out was performed with all team members present. During the time out, the patient, procedure and procedure site when applicable were verbally verified by the team members and Dr. Georgian Co.     Complications: NONE    Access: N/A    Sedation: Conscious Sedation     EBL: Minimal    Samples:  3 mL bloody fluid sent for culture.    Brief Description:  Successful aspiration of intrahepatic fluid collection.    Plan: Per referring provider    Full report to follow in PACS.    Georgian Co, MD, PhD  June 10, 2017 10:44 AM

## 2017-06-10 NOTE — Unmapped (Signed)
OCCUPATIONAL THERAPY  Evaluation (06/10/17 1115)    Patient Name:  Jeffrey Ward       Medical Record Number: [id number]   Date of Birth: 27-Nov-1950  Sex: Male          OT Treatment Diagnosis:  mechanical fall; decline in baseline functional performance    Assessment  67 year old male w/ a h/o hepatitis C cirrhosis and HCC s/p OLT in 09/2016, paroxysmal afib on coumadin, HFpEF, PAH, implanted pacemaker, IDDM, and CBD stones s/p ERCP who was recently found to have multiple liver masses for which he underwent a biopsy w/ IR on 05/24/17 which was complicated by right sided hemothorax s/p pigtail placement in the ER by interventional pulmonology. ??He represents to the ER 5 days after a likely mechanical fall at home and was found to have a right sided pleural effusion w/ continued 3 liver lesions concerning for abscess versus granulomas. Pt presenting to acute OT c deficits in functional strength and endurance requirring minA for functional mobility, t/fs and bADL scoring 22/24 on AMPAC indicating 25% impairment in bADL performance. Review of client factors, current functional performance and limitations/barriers further indicate that their case requires a low complexity of therapeutic intervention and clinical reasoning. At this time, pt will continue to benefit from ongoing acute OT in order to maximize safety and level of functional (I) c re: to bADL and essential mobility. Currently recommend resumption of 3x post acute OT at d/c to further support expedited return to PLOF, decreased caregiver burden and to promote safety during occupational pursuits.      Activity Tolerance During Today's Session  Patient tolerated treatment well    Plan  Planned Frequency of Treatment:  1x per day for: 3-4x week    Planned Interventions:  Adaptive equipment;ADL retraining;Balance activities;Bed mobility;Compensatory tech. training;Conservation;Education - Patient;Education - Family / caregiver;Endurance activities;Functional cognition;Functional mobility;Home exercise program;Modalities;Neuromuscular re-education;Postular / Proximal stability;Positioning;Range of motion;Safety education;Therapeutic exercise;Transfer training;UE Strength / coordination exercise;Visual / perceptual tasks;Splinting - see OT Orthotic Management;Wheelchair evaluation / modification;Wheelchair training;Other    Post-Discharge Occupational Therapy Recommendations:  OT Post Acute Discharge Recommendations: 3x weekly   OT DME Recommendations: None    GOALS:   Patient and Family Goals: To return to PLOF    Long Term Goal #1: Pt will be mod(I) for ADL in 4 weeks.     Short Term:  Pt will be mod(I) for toileting, t/f included.    Time Frame : 2 weeks  Pt will be mod(I) for LBD.   Time Frame : 2 weeks  Pt will be mod(I) for dynamci standing balance task at sink x41mins s rest breaks.    Time Frame : 2 weeks    Prognosis:  Excellent  Positive Indicators:  PLOF, pt motivation, caregiver support  Barriers to Discharge: Inability to safely perform ADLS;Functional strength deficits;Endurance deficits    Subjective  Current Status Pt received and returned to supine, elevated HOB. Needs met. Call bell in reach. Wife at side.   Prior Functional Status PTA, pt was mdo(I) for functional mobility and (I) c bADL.     Medical Tests / Procedures: Reviewed  Services patient receives: OT;PT (HH OT/PT)  Patient / Caregiver reports: RN, pt agreeable to OT    Past Medical History:   Diagnosis Date   ??? Cancer (CMS-HCC)    ??? Cirrhosis (CMS-HCC)    ??? Diabetes (CMS-HCC)    ??? Hepatitis C 07/17/2012   ??? Liver disease    ??? Low back pain 07/17/2012   ???  Varices, esophageal (CMS-HCC)     Social History   Substance Use Topics   ??? Smoking status: Never Smoker   ??? Smokeless tobacco: Never Used      Comment: Smoked in high school for about 5 years.    ??? Alcohol use No      Past Surgical History:   Procedure Laterality Date   ??? ANKLE SURGERY     ??? BACK SURGERY     ??? BACK SURGERY     ??? CHG US GUIDE, TISSUE ABLATION N/A 01/22/2016    Procedure: ULTRASOUND GUIDANCE FOR, AND MONITORING OF, PARENCHYMAL TISSUE ABLATION;  Surgeon: Particia Nearing, MD;  [facility] MAIN OR [facility];  Service: Transplant   ??? IR EMBOLIZATION ORGAN ISCHEMIA, TUMORS, INFAR  06/16/2016    IR EMBOLIZATION ORGAN ISCHEMIA, TUMORS, INFAR 06/16/2016 Ammie Dalton, MD IMG VIR H&V [facility]   ??? PR COLON CA SCRN NOT HI RSK IND N/A 02/27/2015    Procedure: COLOREC CNCR SCR;COLNSCPY NO;  Surgeon: Vonda Antigua, MD;  Location: [facility] PROCEDURES [facility]IAL [facility];  Service: Gastroenterology   ??? PR ENDOSCOPIC ULTRASOUND EXAM N/A 02/27/2015    Procedure: UGI ENDO; W/ENDO ULTRASOUND EXAM INCLUDES ESOPHAGUS, STOMACH, &/OR DUODENUM/JEJUNUM;  Surgeon: Vonda Antigua, MD;  Location: GI PROCEDURES MEMORIAL [facility];  Service: Gastroenterology   ??? PR ERCP BALLOON DILATE BILIARY/PANC DUCT/AMPULLA EA N/A 02/10/2017    Procedure: ERCP;WITH TRANS-ENDOSCOPIC BALLOON DILATION OF BILIARY/PANCREATIC DUCT(S) OR OF AMPULLA, INCLUDING SPHINCTERECTOMY, WHEN PERFOREMD,EACH DUCT (57846);  Surgeon: Mayford Knife, MD;  Location: GI PROCEDURES MEM[facility];  Service: Gastroenterology   ??? PR ERCP REMOVE FOREIGN BODY/STENT BILIARY/PANC DUCT N/A 02/10/2017    Procedure: ENDOSCOPIC RETROGRADE CHOLANGIOPANCREATOGRAPHY (ERCP); W/ REMOVAL OF FOREIGN BODY/STENT FROM BILIARY/PANCREATIC DUCT(S);  Surgeon: Mayford Knife, MD;  Location: GI PROCEDURES MEMORIAL [facility];  Service: Gastroenterology   ??? PR ERCP STENT PLACEMENT BILIARY/PANCREATIC DUCT N/A 11/29/2016    Procedure: ENDOSCOPIC RETROGRADE CHOLANGIOPANCREATOGRAPHY (ERCP); WITH PLACEMENT OF ENDOSCOPIC STENT INTO BILIARY OR PANCREATIC DUCT;  Surgeon: Chriss Driver, MD;  Location: GI PROCEDURES MEMORIAL [facility];  Service: Gastroenterology   ??? PR ERCP STENT PLACEMENT BILIARY/PANCREATIC DUCT N/A 04/26/2017    Procedure: ENDOSCOPIC RETROGRADE CHOLANGIOPANCREATOGRAPHY (ERCP); WITH PLACEMENT OF ENDOSCOPIC STENT INTO BILIARY OR PANCREATIC DUCT;  Surgeon: Mayford Knife, MD;  Location: GI PROCEDURES [facility]ORIAL [facility];  Service: Gastroenterology   ??? PR ERCP,W/REMOVAL STONE,BIL/PANCR DUCTS N/A 11/29/2016    Procedure: ERCP; W/ENDOSCOPIC RETROGRADE REMOVAL OF CALCULUS/CALCULI FROM BILIARY &/OR PANCREATIC DUCTS;  Surgeon: Chriss Driver, MD;  Location: GI PROCEDURES MEMORIAL [facility];  Service: Gastroenterology   ??? PR ERCP,W/REMOVAL STONE,BIL/PANCR DUCTS N/A 02/10/2017    Procedure: ERCP; W/ENDOSCOPIC RETROGRADE REMOVAL OF CALCULUS/CALCULI FROM BILIARY &/OR PANCREATIC DUCTS;  Surgeon: Mayford Knife, MD;  Location: GI PROCEDURES MEMORIAL [facility];  Service: Gastroenterology   ??? PR ERCP,W/REMOVAL STONE,BIL/PANCR DUCTS N/A 04/26/2017    Procedure: ERCP; W/ENDOSCOPIC RETROGRADE REMOVAL OF CALCULUS/CALCULI FROM BILIARY &/OR PANCREATIC DUCTS;  Surgeon: Mayford Knife, MD;  Location: GI PROCEDURES MEMORIAL [facility];  Service: Gastroenterology   ??? PR INSER HEART TEMP PACER ONE CHMBR N/A 10/02/2016    Procedure: Tempoarary Pacemaker Insertion;  Surgeon: Meredith Leeds, MD;  Location: [facility];  Service: Cardiology   ??? PR LAP,DIAGNOSTIC ABDOMEN N/A 01/22/2016    Procedure: Laparoscopy, Abdomen, Peritoneum, & Omentum, Diagnostic, W/Wo Collection Specimen(S) By Brushing Or Washing;  Surgeon: Particia Nearing, MD;  Location: MAIN OR [facility];  Service: Transplant   ??? PR PLACE PERCUT GASTROSTOMY TUBE N/A 11/17/2016  Procedure: UGI ENDO; W/DIRECTED PLCMT PERQ GASTROSTOMY TUBE;  Surgeon: Cletis Athens, MD;  Location: GI PROCEDURES MEMORIAL [facility];  Service: Gastroenterology   ??? PR TRACHEOSTOMY, PLANNED N/A 09/29/2016    Procedure: TRACHEOSTOMY PLANNED (SEPART PROC);  Surgeon: Katherina Mires, MD;  Location: MAIN OR [facility];  Service: Trauma   ??? PR TRANSCATH INSERT OR REPLACE LEADLESS PM VENTR N/A 10/11/2016    Procedure: Pacemaker Implant/Replace Leadless;  Surgeon: Meredith Leeds, MD;  Location: [facility];  Service: Cardiology   ??? PR TRANSPLANT LIVER,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 Procedure: Liver Allotransplantation; Orthotopic, Partial Or Whole, From Cadaver Or Living Donor, Any Age;  Surgeon: Doyce Loose, MD;  Location: MAIN OR [facility];  Service: Transplant   ??? PR TRANSPLANT,PREP DONOR LIVER, WHOLE N/A 09/15/2016    Procedure: Rogelia Boga Std Prep Cad Donor Whole Liver Gft Prior Tnsplnt,Inc Chole,Diss/Rem Surr Tissu Wo Triseg/Lobe Splt;  Surgeon: Doyce Loose, MD;  Location: MAIN OR [facility] [place];  Service: Transplant   ??? PR UPPER GI ENDOSCOPY,BIOPSY N/A 07/17/2012    Procedure: UGI ENDOSCOPY; WITH BIOPSY, SINGLE OR MULTIPLE;  Surgeon: Alba Destine, MD;  Location: GI PROCEDURES MEMORIAL [facility];  Service: Gastroenterology   ??? PR UPPER GI ENDOSCOPY,DIAGNOSIS N/A 02/04/2014    Procedure: UGI ENDO, INCLUDE ESOPHAGUS, STOMACH, & DUODENUM &/OR JEJUNUM; DX W/WO COLLECTION SPECIMN, BY BRUSH OR WASH;  Surgeon: Wilburt Finlay, MD;  Location: GI PROCEDURES [facility]IAL [facility];  Service: Gastroenterology   ??? PR UPPER GI ENDOSCOPY,LIGAT VARIX N/A 11/05/2013    Procedure: UGI ENDO; W/BAND LIG ESOPH &/OR GASTRIC VARICES;  Surgeon: Wilburt Finlay, MD;  Location: GI PROCEDURES [facility]IAL [facility];  Service: Gastroenterology    Family History   Problem Relation Age of Onset   ??? Hypertension Mother    ??? Cirrhosis Neg Hx    ??? Liver cancer Neg Hx    ??? Anemia Neg Hx    ??? Cancer Neg Hx    ??? Diabetes Neg Hx    ??? Kidney disease Neg Hx    ??? Obesity Neg Hx    ??? Thyroid disease Neg Hx    ??? Osteoporosis Neg Hx    ??? Coronary artery disease Neg Hx    ??? Anesthesia problems Neg Hx         Patient has no known allergies.     Objective Findings  Precautions / Restrictions  Falls precautions    Weight Bearing  Non-applicable    Required Braces or Orthoses  Non-applicable    Communication Preference  Verbal    Pain  No c/o pain    Equipment / Environment  Vascular access (PIV, TLC, Port-a-cath, PICC);Chest tube(s)    Living Situation   Living environment: House   Lives With: Spouse   Home Living: One level home;Stairs to enter with rails   Equipment available at home: Rollator    Cognition   Orientation Level:  Oriented x 4   Arousal/Alertness:  Appropriate responses to stimuli   Attention Span:  Appears intact   Memory:  Appears intact   Following Commands:  Follows all commands and directions without difficulty   Safety Judgment:  Good awareness of safety precautions   Awareness of Errors:  Good awareness of safety precautions   Problem Solving:  Able to problem solve independently    Vision / Perception  Vision: Wears glasses for reading only  Perception: WFL    Hand Function  Hand Dominance: RHD  B grasp, release and FMC intact    Skin Inspection  Visible skin c/d/i    ROM / Strength/Coordination  UE ROM/ Strength/ Coordination: BUE AROM WFL  LE ROM/ Strength/ Coordination: Defer to PT for formal assessment    Sensation:  SILT x4    Balance:  CGA dynamic sitting, unsupported. MinA dynamic standing.     Mobility/Gait/Transfers: MinA sup<>sit c elevated HOB. CGA sit<>stand. MinA room level mob     ADL:  Bathing: NT  Grooming: S/U  Dressing: MinA LBD. S/U UBD. - reports and demo'd difficulty c LLE mobiltiy; educated on comp strategies to which he was receptive.   Eating: S/U  Toileting: NT    Vitals/ Orthostatics:  At Rest: NAD  With Activity: NAD  Orthostatics: NA    Interventions Performed During Today's Session: pt education; safety education; func mob; t/f training; caregiver education; role of OT; POC; comp tech train; ADL retrain    Eval Duration (OT): 20 Min.    Medical Staff Made Aware: RN     I attest that I have reviewed the above information.  Signed: Elenora Fender, OT  Filed 06/10/2017

## 2017-06-10 NOTE — Unmapped (Signed)
Transplant Surgery Progress Note    Hospital Day: 3    Assessment:     67 year old male w/ a h/o hepatitis C cirrhosis and HCC s/p OLT in 09/2016, paroxysmal afib on coumadin, HFpEF, PAH, implanted pacemaker, IDDM, and CBD stones s/p ERCP who was recently found to have multiple liver masses for which he underwent a biopsy w/ IR on 05/24/17 which was complicated by right sided hemothorax s/p pigtail placement in the ER by interventional pulmonology.  He represents to the ER 5 days after a likely mechanical fall at home and was found to have a right sided pleural effusion w/ continued 3 liver lesions concerning for abscess versus granulomas.  He has been afebrile, but his liver enzymes and bilirubin have been elevated.      Interval Events:     Patient doing well overnight, pain has been controlled. He is NPO for IR biopsy and culture of liver lesions today.    Plan:     Neuro:  - Pain well controlled  - Will wean IV pain meds for dispo planning  - Continue PO PRNs    CV:  - HDS  - On home meds    Pulm:  - Stable WOB  - Will go for drainage of pleural effusion today with VIR, send fluid for culture  - IS/pulm toilet given recent fall and rib pain  - f/u AM chest x-ray    GI:  - NPO w/ IVF@50 /hr for procedure  - Regular diet post-procedure   - Bilirubin downtrending, will continue to follow  - Bowel regimen    GU:  - Voiding adequately  - Daily labs    Heme/ID:  - Afebrile without leukocytosis  - Infectious disease consult for possibility of empiric treatment of liver lesions/abscesses    Immuno:  - Continue Cyclosporine, Cellcept    - Dispo: Floor  Subjective:     Jeffrey Ward reports NAEON. Pain well controlled.     Objective:        Vital Signs:  BP 135/73  - Pulse 79  - Temp 36.4 ??C (Oral)  - Resp 24  - Ht 170.2 cm (5' 7)  - Wt 58.7 kg (129 lb 4.8 oz)  - SpO2 98%  - BMI 20.25 kg/m??     Input/Output:  I/O last 3 completed shifts:  In: -   Out: 1355 [Urine:925; Other:430]    Physical Exam:    General: Cooperative, no distress, well appearing male  Pulmonary: Normal work of breathing, equal bilateral chest rise  Cardiovascular: Regular rate and rhythm  Abdomen: Soft, non-tender, non-distended.  Musculoskeletal: Good range of motion in bilateral upper/lower extremities.  Skin: No jaundice, rashes, erythema, or lesions. Skin color and texture normal.  Neurologic: Alert and interactive, no motor abnormalities noted. Sensation grossly intact.    Labs:  Lab Results   Component Value Date    WBC 3.2 (L) 06/10/2017    HGB 8.0 (L) 06/10/2017    HCT 25.7 (L) 06/10/2017    PLT 132 (L) 06/10/2017       Lab Results   Component Value Date    NA 131 (L) 06/10/2017    K 4.9 06/10/2017    CL 101 06/10/2017    CO2 24.0 06/10/2017    BUN 35 (H) 06/10/2017    CREATININE 1.51 (H) 06/10/2017    CALCIUM 8.8 06/10/2017    MG 1.7 06/10/2017    PHOS 3.2 06/10/2017       Microbiology Results (  last day)     Procedure Component Value Date/Time Date/Time    Blood Culture #1 [[id number]] Collected:  06/08/17 1249    Lab Status:  In process Specimen:  Blood from Peripheral Draw Updated:  06/08/17 1337    Blood Culture #2 [[id number]] Collected:  06/08/17 1249    Lab Status:  In process Specimen:  Blood from Peripheral Draw Updated:  06/08/17 1337    Lower Respiratory Culture [[id number]]     Lab Status:  No result Specimen:  Sputum from SPUTUM EXPECTORATED           Imaging:  All pertinent imaging personally reviewed.

## 2017-06-10 NOTE — Unmapped (Signed)
Requesting Attending Physician: Doyce Loose,*  Service Requesting Consult: Surg Transplant [facility])    Date of Service: 06/09/2017  Consulting Interventional Radiologist: Dr. Ammie Dalton     HPI:     Procedure Requested: Aspiration of hepatic lesions    Jeffrey Ward is a 67 y.o. male with a history significant for hepatitis C cirrhosis and HCC s/p OLT in 09/2016, paroxysmal afib on coumadin, HFpEF, PAH, implanted pacemaker, IDDM,  found to have multiple liver masses for which he underwent a biopsy w/ IR on 05/24/17 which was complicated by right sided hemothorax.  He is currently readmitted s/p fall was found to have a right sided pleural effusion w/ continued 3 liver lesions concerning for abscess versus granulomas. IR performed thoracentesis on 06/09/17 with pleural fluid sent for culture. Consulted for re-biopsy/aspiration of hepatic lesions.        Medical History:     Past Medical History:  Past Medical History:   Diagnosis Date   ??? Cancer (CMS-HCC)    ??? Cirrhosis (CMS-HCC)    ??? Diabetes (CMS-HCC)    ??? Hepatitis C 07/17/2012   ??? Liver disease    ??? Low back pain 07/17/2012   ??? Varices, esophageal (CMS-HCC)        Surgical History:  Past Surgical History:   Procedure Laterality Date   ??? ANKLE SURGERY     ??? BACK SURGERY     ??? BACK SURGERY     ??? CHG US GUIDE, TISSUE ABLATION N/A 01/22/2016    Procedure: ULTRASOUND GUIDANCE FOR, AND MONITORING OF, PARENCHYMAL TISSUE ABLATION;  Surgeon: Particia Nearing, MD;  Location: MAIN OR [facility];  Service: Transplant   ??? IR EMBOLIZATION ORGAN ISCHEMIA, TUMORS, INFAR  06/16/2016    IR EMBOLIZATION ORGAN ISCHEMIA, TUMORS, INFAR 06/16/2016 Ammie Dalton, MD IMG VIR [facility]   ??? PR COLON CA SCRN NOT HI RSK IND N/A 02/27/2015    Procedure: COLOREC CNCR SCR;COLNSCPY NO;  Surgeon: Vonda Antigua, MD;  Location: GI PROCEDURES MEMORIAL [facility];  Service: Gastroenterology   ??? PR ENDOSCOPIC ULTRASOUND EXAM N/A 02/27/2015    Procedure: UGI ENDO; W/ENDO ULTRASOUND EXAM INCLUDES ESOPHAGUS, STOMACH, &/OR DUODENUM/JEJUNUM;  Surgeon: Vonda Antigua, MD;  Location: GI PROCEDURES MEMORIAL [facility];  Service: Gastroenterology   ??? PR ERCP BALLOON DILATE BILIARY/PANC DUCT/AMPULLA EA N/A 02/10/2017    Procedure: ERCP;WITH TRANS-ENDOSCOPIC BALLOON DILATION OF BILIARY/PANCREATIC DUCT(S) OR OF AMPULLA, INCLUDING SPHINCTERECTOMY, WHEN PERFOREMD,EACH DUCT ([id number]);  Surgeon: Mayford Knife, MD;  Location: GI PROCEDURES MEMORIAL [facility];  Service: Gastroenterology   ??? PR ERCP REMOVE FOREIGN BODY/STENT BILIARY/PANC DUCT N/A 02/10/2017    Procedure: ENDOSCOPIC RETROGRADE CHOLANGIOPANCREATOGRAPHY (ERCP); W/ REMOVAL OF FOREIGN BODY/STENT FROM BILIARY/PANCREATIC DUCT(S);  Surgeon: Mayford Knife, MD;  Location: GI PROCEDURES MEMORIAL [facility];  Service: Gastroenterology   ??? PR ERCP STENT PLACEMENT BILIARY/PANCREATIC DUCT N/A 11/29/2016    Procedure: ENDOSCOPIC RETROGRADE CHOLANGIOPANCREATOGRAPHY (ERCP); WITH PLACEMENT OF ENDOSCOPIC STENT INTO BILIARY OR PANCREATIC DUCT;  Surgeon: Chriss Driver, MD;  Location: GI PROCEDURES MEMORIAL [facility];  Service: Gastroenterology   ??? PR ERCP STENT PLACEMENT BILIARY/PANCREATIC DUCT N/A 04/26/2017    Procedure: ENDOSCOPIC RETROGRADE CHOLANGIOPANCREATOGRAPHY (ERCP); WITH PLACEMENT OF ENDOSCOPIC STENT INTO BILIARY OR PANCREATIC DUCT;  Surgeon: Mayford Knife, MD;  Location: GI PROCEDURES MEMORIAL [facility];  Service: Gastroenterology   ??? PR ERCP,W/REMOVAL STONE,BIL/PANCR DUCTS N/A 11/29/2016    Procedure: ERCP; W/ENDOSCOPIC RETROGRADE REMOVAL OF CALCULUS/CALCULI FROM BILIARY &/OR PANCREATIC DUCTS;  Surgeon: Chriss Driver, MD;  Location: GI PROCEDURES MEMORIAL  [facility];  Service: Gastroenterology   ??? PR ERCP,W/REMOVAL STONE,BIL/PANCR DUCTS N/A 02/10/2017    Procedure: ERCP; W/ENDOSCOPIC RETROGRADE REMOVAL OF CALCULUS/CALCULI FROM BILIARY &/OR PANCREATIC DUCTS;  Surgeon: Mayford Knife, MD;  Location: GI PROCEDURES MEMORIAL [facility];  Service: Gastroenterology   ??? PR ERCP,W/REMOVAL STONE,BIL/PANCR DUCTS N/A 04/26/2017    Procedure: ERCP; W/ENDOSCOPIC RETROGRADE REMOVAL OF CALCULUS/CALCULI FROM BILIARY &/OR PANCREATIC DUCTS;  Surgeon: Mayford Knife, MD;  Location: GI PROCEDURES MEMORIAL [facility];  Service: Gastroenterology   ??? PR INSER HEART TEMP PACER ONE CHMBR N/A 10/02/2016    Procedure: Tempoarary Pacemaker Insertion;  Surgeon: Meredith Leeds, MD;  Location: [facility];  Service: Cardiology   ??? PR LAP,DIAGNOSTIC ABDOMEN N/A 01/22/2016    Procedure: Laparoscopy, Abdomen, Peritoneum, & Omentum, Diagnostic, W/Wo Collection Specimen(S) By Brushing Or Washing;  Surgeon: Particia Nearing, MD;  Location: MAIN OR [facility];  Service: Transplant   ??? PR PLACE PERCUT GASTROSTOMY TUBE N/A 11/17/2016    Procedure: UGI ENDO; W/DIRECTED PLCMT PERQ GASTROSTOMY TUBE;  Surgeon: Cletis Athens, MD;  Location: GI PROCEDURES MEMORIAL [facility];  Service: Gastroenterology   ??? PR TRACHEOSTOMY, PLANNED N/A 09/29/2016    Procedure: TRACHEOSTOMY PLANNED (SEPART PROC);  Surgeon: Katherina Mires, MD;  Location: MAIN OR [facility];  Service: Trauma   ??? PR TRANSCATH INSERT OR REPLACE LEADLESS PM VENTR N/A 10/11/2016    Procedure: Pacemaker Implant/Replace Leadless;  Surgeon: Meredith Leeds, MD;  Location: [facility];  Service: Cardiology   ??? PR TRANSPLANT LIVER,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    Procedure: Liver Allotransplantation; Orthotopic, Partial Or Whole, From Cadaver Or Living Donor, Any Age;  Surgeon: Doyce Loose, MD;  Location: MAIN OR [facility];  Service: Transplant   ??? PR TRANSPLANT,PREP DONOR LIVER, WHOLE N/A 09/15/2016    Procedure: Rogelia Boga Std Prep Cad Donor Whole Liver Gft Prior Tnsplnt,Inc Chole,Diss/Rem Surr Tissu Wo Triseg/Lobe Splt;  Surgeon: Doyce Loose, MD;  Location: MAIN OR [facility];  Service: Transplant   ??? PR UPPER GI ENDOSCOPY,BIOPSY N/A 07/17/2012    Procedure: UGI ENDOSCOPY; WITH BIOPSY, SINGLE OR MULTIPLE;  Surgeon: Alba Destine, MD;  Location: GI PROCEDURES MEMORIAL [facility];  Service: Gastroenterology   ??? PR UPPER GI ENDOSCOPY,DIAGNOSIS N/A 02/04/2014    Procedure: UGI ENDO, INCLUDE ESOPHAGUS, STOMACH, & DUODENUM &/OR JEJUNUM; DX W/WO COLLECTION SPECIMN, BY BRUSH OR WASH;  Surgeon: Wilburt Finlay, MD;  Location: GI PROCEDURES MEMORIAL [facility];  Service: Gastroenterology   ??? PR UPPER GI ENDOSCOPY,LIGAT VARIX N/A 11/05/2013    Procedure: UGI ENDO; W/BAND LIG ESOPH &/OR GASTRIC VARICES;  Surgeon: Wilburt Finlay, MD;  Location: GI PROCEDURES MEMORIAL [facility];  Service: Gastroenterology       Family History:  Family History   Problem Relation Age of Onset   ??? Hypertension Mother    ??? Cirrhosis Neg Hx    ??? Liver cancer Neg Hx    ??? Anemia Neg Hx    ??? Cancer Neg Hx    ??? Diabetes Neg Hx    ??? Kidney disease Neg Hx    ??? Obesity Neg Hx    ??? Thyroid disease Neg Hx    ??? Osteoporosis Neg Hx    ??? Coronary artery disease Neg Hx    ??? Anesthesia problems Neg Hx        Medications:   Current Facility-Administered Medications   Medication Dose Route Frequency Provider Last Rate Last Dose   ??? ciprofloxacin HCl (CIPRO) tablet 500 mg  500 mg Oral Q12H [facility] Belva Chimes [place],  PA       ??? cycloSPORINE modified (NEORAL) capsule 175 mg  175 mg Oral BID Irwin Brakeman, MD   175 mg at 06/09/17 1725   ??? dextrose 50 % in water (D50W) 50 % solution 12.5 g  12.5 g Intravenous Q10 Min PRN Irwin Brakeman, MD       ??? digoxin (LANOXIN) tablet 62.5 mcg  62.5 mcg Oral Daily Irwin Brakeman, MD   62.5 mcg at 06/09/17 1610   ??? docusate sodium (COLACE) capsule 100 mg  100 mg Oral BID Irwin Brakeman, MD   Stopped at 06/09/17 0920   ??? fentaNYL (PF) (SUBLIMAZE) injection 50 mcg  50 mcg Intravenous Q4H PRN Irwin Brakeman, MD       ??? heparin (porcine) injection 5,000 Units  5,000 Units Subcutaneous [facility] Irwin Brakeman, MD   5,000 Units at 06/09/17 1537   ??? insulin lispro (HumaLOG) injection 6 Units  6 Units Subcutaneous TID [facility] Irwin Brakeman, MD   6 Units at 06/09/17 1759   ??? insulin regular (HumuLIN,NovoLIN) injection 0-12 Units  0-12 Units Subcutaneous ACHS Irwin Brakeman, MD   Stopped at 06/09/17 1718   ??? lidocaine (LIDODERM) 5 % patch 1 patch  1 patch Transdermal Daily Belva Chimes [place], [place]   Stopped at 06/09/17 1017   ??? melatonin tablet 3 mg  3 mg Oral Nightly Irwin Brakeman, MD   3 mg at 06/08/17 2014   ??? metroNIDAZOLE (FLAGYL) tablet 500 mg  500 mg Oral TID Drake Leach, PA   500 mg at 06/09/17 1724   ??? mirtazapine (REMERON) tablet 15 mg  15 mg Oral Nightly Irwin Brakeman, MD   15 mg at 06/08/17 2014   ??? mycophenolate (CELLCEPT) capsule 500 mg  500 mg Oral BID Irwin Brakeman, MD   500 mg at 06/09/17 1724   ??? ondansetron (ZOFRAN-ODT) disintegrating tablet 4 mg  4 mg Oral Q8H PRN Irwin Brakeman, MD       ??? oxyCODONE (ROXICODONE) immediate release tablet 5 mg  5 mg Oral Q6H PRN Irwin Brakeman, MD   5 mg at 06/09/17 1237   ??? pantoprazole (PROTONIX) EC tablet 20 mg  20 mg Oral Daily Irwin Brakeman, MD   20 mg at 06/09/17 9604   ??? polyethylene glycol (MIRALAX) packet 17 g  17 g Oral Daily Irwin Brakeman, MD       ??? sildenafil (antihypertensive) (REVATIO) tablet 40 mg  40 mg Oral TID Drake Leach, PA   40 mg at 06/09/17 1534   ??? sodium chloride (NS) 0.9 % infusion  50 mL/hr Intravenous Continuous Irwin Brakeman, MD 50 mL/hr at 06/08/17 2015 50 mL/hr at 06/08/17 2015       Allergies:  Patient has no known allergies.    Social History:  Social History   Substance Use Topics   ??? Smoking status: Never Smoker   ??? Smokeless tobacco: Never Used      Comment: Smoked in high school for about 5 years.    ??? Alcohol use No       Objective:    Pertinent Laboratory Values:  WBC   Date Value Ref Range Status   06/09/2017 5.2 4.5 - 11.0 10*9/L Final   01/09/2014 2.4 (L) 4.5 - 11.0 10*9/L Final     HGB   Date Value Ref Range Status   06/09/2017 8.8 (L) 13.5 - 17.5 g/dL Final     Hemoglobin   Date Value Ref Range Status   09/26/2016 6.2 (L)  13.5 - 17.5 g/dL Final Comment:     Point of Care Testing performed at the point of care by trained personnel per documented policies.     HCT   Date Value Ref Range Status   06/09/2017 27.8 (L) 41.0 - 53.0 % Final   01/09/2014 35.4 (L) 41.0 - 53.0 % Final     Platelet   Date Value Ref Range Status   06/09/2017 152 150 - 440 10*9/L Final   01/09/2014 35 (L) 150 - 440 10*9/L Final     INR   Date Value Ref Range Status   06/09/2017 1.71  Final   01/09/2014 1.3  Final     Creatinine Whole Blood, POC   Date Value Ref Range Status   09/25/2015 1.0 0.8 - 1.4 mg/dL Final     Creatinine   Date Value Ref Range Status   06/09/2017 1.42 (H) 0.70 - 1.30 mg/dL Final       Imaging Reviewed:  CT abdomen pelvis 06/08/17.    Febrile:  No    Anticoaguation: SQ heparin    Physical Exam:    Vitals:    06/09/17 1525   BP: 129/61   Pulse: 84   Resp: 16   Temp: 36.8 ??C (98.2 ??F)   SpO2: 99%     ASA Grade: ASA 2 - Patient with mild systemic disease with no functional limitations  General: No apparent distress.  Lungs: Breathing comfortably on room air.  Heart:  Regular rate and rhythm.  Neuro: No obvious focal deficits.    Airway assessment: Class 2 - Can visualize soft palate and fauces, tip of uvula is obscured    Assessment and Recommendations:     Mr. Varady is a 67 y.o. male with a history significant for hepatitis C cirrhosis and HCC s/p OLT in 09/2016, paroxysmal afib on coumadin, HFpEF, PAH, implanted pacemaker, IDDM,  found to have multiple liver masses for which he underwent a biopsy w/ IR on 05/24/17 which was complicated by right sided hemothorax.  He is currently readmitted s/p fall was found to have a right sided pleural effusion w/ continued 3 liver lesions concerning for abscess versus granulomas. IR performed thoracentesis on 06/09/17 with pleural fluid sent for culture. Consulted for re-biopsy/aspiration of hepatic lesions    Recommendations:  - VIR does recommend proceeding with aspiration/biopsy of hepatic lesions. Will perform pre-procedure scan with ultrasound to evaluate possible ultrasound guided approach. Likely will need to perform procedure with CT guidance.   - Anticipated procedure date: 06/10/17  - Please make NPO night prior to procedure  - Please ensure recent CBC, Creatinine, and INR are available    Informed Consent:  This procedure has been fully reviewed with the patient/patient???s authorized representative. The risks, benefits and alternatives have been explained, and the patient/patient???s authorized representative has consented to the procedure.  --The patient will accept blood products in an emergent situation.  --The patient does not have a Do Not Resuscitate order in effect.    The patient was discussed with Dr. Ammie Dalton.     Thank you for involving [place] in the care of this patient. Please page the VIR consult pager [phone]) with further questions, concerns, or if new issues arise.  Marland Kitchen

## 2017-06-10 NOTE — Unmapped (Signed)
Problem: Patient Care Overview  Goal: Plan of Care Review  Outcome: Progressing   06/09/17 2035   OTHER   Plan of Care Reviewed With patient   Plan of Care Review   Progress improving   Care plan reviewed with patient. Went to [employer] for procedure and tolerated well per RN report. Required PRN pain medication for back pain radiating to his back. Regular diet resumed and patient tolerated dinner w/o issues.

## 2017-06-10 NOTE — Unmapped (Signed)
Problem: Patient Care Overview  Goal: Plan of Care Review  Outcome: Not Progressing  Pt is alert and oriented x4, call bell in reach, bed in low position. Pt c/o pain this shift, prn medication given. Pt is resting with eyes closed at this time. POC gone over with pt, no questions at this time. No s/s of distress noted. Will continue to monitor.

## 2017-06-10 NOTE — Unmapped (Signed)
Problem: Patient Care Overview  Goal: Plan of Care Review  Outcome: Progressing  POC reviewed with the patient. Patient admitted with right pleural effusion s/p fall. Patient is A&Ox4. NAD. Patient had IR drainage of 1 of the 3 liver lesions today. Patient complains of pain that is controlled with oral pain medcations. Patient remains afebrile and exhibits no signs and symptoms of infection. Patient is voiding and the urinary output has remained adequate. Patient continues on a regular diet and is tolerating well. Patient is able to ambulate with minimal assistance and has remained free from falls/injury. Bed in a low and locked position. Call bell within reach at all times. Will continue to monitor.       06/10/17 1507   OTHER   Plan of Care Reviewed With patient   Plan of Care Review   Progress improving

## 2017-06-11 LAB — COMPREHENSIVE METABOLIC PANEL
ALKALINE PHOSPHATASE: 330 U/L — ABNORMAL HIGH (ref 38–126)
ALT (SGPT): 91 U/L — ABNORMAL HIGH (ref 19–72)
ANION GAP: 8 mmol/L — ABNORMAL LOW (ref 9–15)
AST (SGOT): 27 U/L (ref 19–55)
BILIRUBIN TOTAL: 0.7 mg/dL (ref 0.0–1.2)
BLOOD UREA NITROGEN: 25 mg/dL — ABNORMAL HIGH (ref 7–21)
BUN / CREAT RATIO: 19
CALCIUM: 8.5 mg/dL (ref 8.5–10.2)
CHLORIDE: 101 mmol/L (ref 98–107)
CO2: 22 mmol/L (ref 22.0–30.0)
CREATININE: 1.35 mg/dL — ABNORMAL HIGH (ref 0.70–1.30)
EGFR MDRD AF AMER: >=60 mL/min/{1.73_m2} (ref >=60)
EGFR MDRD NON AF AMER: 52 mL/min/{1.73_m2} — ABNORMAL LOW (ref >=60)
GLUCOSE RANDOM: 175 mg/dL (ref 65–179)
POTASSIUM: 4.6 mmol/L (ref 3.5–5.0)
PROTEIN TOTAL: 4.6 g/dL — ABNORMAL LOW (ref 6.5–8.3)
SODIUM: 131 mmol/L — ABNORMAL LOW (ref 135–145)

## 2017-06-11 LAB — CBC W/ AUTO DIFF
BASOPHILS ABSOLUTE COUNT: 0 10*9/L (ref 0.0–0.1)
BASOPHILS RELATIVE PERCENT: 0.2 %
EOSINOPHILS RELATIVE PERCENT: 1.5 %
HEMATOCRIT: 25.7 % — ABNORMAL LOW (ref 41.0–53.0)
HEMOGLOBIN: 8.3 g/dL — ABNORMAL LOW (ref 13.5–17.5)
LARGE UNSTAINED CELLS: 2 % (ref 0–4)
LYMPHOCYTES ABSOLUTE COUNT: 0.4 10*9/L — ABNORMAL LOW (ref 1.5–5.0)
LYMPHOCYTES RELATIVE PERCENT: 11.5 %
MEAN CORPUSCULAR HEMOGLOBIN CONC: 32.3 g/dL (ref 31.0–37.0)
MEAN CORPUSCULAR HEMOGLOBIN: 28.9 pg (ref 26.0–34.0)
MEAN CORPUSCULAR VOLUME: 89.3 fL (ref 80.0–100.0)
MEAN PLATELET VOLUME: 8.6 fL (ref 7.0–10.0)
MONOCYTES ABSOLUTE COUNT: 0.3 10*9/L (ref 0.2–0.8)
NEUTROPHILS ABSOLUTE COUNT: 3 10*9/L (ref 2.0–7.5)
NEUTROPHILS RELATIVE PERCENT: 78.4 %
PLATELET COUNT: 134 10*9/L — ABNORMAL LOW (ref 150–440)
RED BLOOD CELL COUNT: 2.88 10*12/L — ABNORMAL LOW (ref 4.50–5.90)
RED CELL DISTRIBUTION WIDTH: 15.8 % — ABNORMAL HIGH (ref 12.0–15.0)
WBC ADJUSTED: 3.8 10*9/L — ABNORMAL LOW (ref 4.5–11.0)

## 2017-06-11 LAB — CALCIUM: Calcium:MCnc:Pt:Ser/Plas:Qn:: 8.5

## 2017-06-11 LAB — MAGNESIUM: Magnesium:MCnc:Pt:Ser/Plas:Qn:: 1.7

## 2017-06-11 LAB — PHOSPHORUS: Phosphate:MCnc:Pt:Ser/Plas:Qn:: 2.8 — ABNORMAL LOW

## 2017-06-11 LAB — HYPOCHROMIA

## 2017-06-11 MED ORDER — DOCUSATE SODIUM 100 MG CAPSULE: 100 mg | capsule | Freq: Two times a day (BID) | 0 refills | 0 days | Status: AC

## 2017-06-11 MED ORDER — METRONIDAZOLE 500 MG TABLET
ORAL_TABLET | Freq: Three times a day (TID) | ORAL | 0 refills | 0.00000 days | Status: CP
Start: 2017-06-11 — End: 2017-06-22

## 2017-06-11 MED ORDER — OXYCODONE 5 MG TABLET
ORAL_TABLET | ORAL | 0 refills | 0 days | Status: CP | PRN
Start: 2017-06-11 — End: 2017-06-22

## 2017-06-11 MED ORDER — DOCUSATE SODIUM 100 MG CAPSULE
ORAL_CAPSULE | Freq: Two times a day (BID) | ORAL | 0 refills | 0.00000 days | Status: CP
Start: 2017-06-11 — End: 2017-06-11

## 2017-06-11 MED ORDER — CIPROFLOXACIN 500 MG TABLET: 500 mg | tablet | Freq: Two times a day (BID) | 0 refills | 0 days | Status: AC

## 2017-06-11 MED ORDER — POLYETHYLENE GLYCOL 3350 17 GRAM/DOSE ORAL POWDER
0 refills | 0 days | Status: CP
Start: 2017-06-11 — End: 2018-04-17

## 2017-06-11 MED ORDER — METRONIDAZOLE 500 MG TABLET: 500 mg | each | 0 refills | 0 days

## 2017-06-11 MED ORDER — CIPROFLOXACIN 500 MG TABLET
ORAL_TABLET | Freq: Two times a day (BID) | ORAL | 0 refills | 0.00000 days | Status: CP
Start: 2017-06-11 — End: 2017-06-11

## 2017-06-11 MED FILL — METRONIDAZOLE/500MG/TAB: METRONIDAZOLE/500MG/TAB | 10 days supply | Qty: 30 | Fill #0

## 2017-06-11 MED FILL — GAVILAX POWDER/3350/POWD: GAVILAX POWDER/3350/POWD | 30 days supply | Qty: 1 | Fill #0

## 2017-06-11 MED FILL — DOCUSATE/100MG/CAPS: DOCUSATE/100MG/CAPS | 30 days supply | Qty: 60 | Fill #0

## 2017-06-11 MED FILL — CIPROFLOXACIN/500MG/TABS: CIPROFLOXACIN/500MG/TABS | 10 days supply | Qty: 20 | Fill #0

## 2017-06-11 MED FILL — OXYCODONE HCL/5MG/TABS: OXYCODONE HCL/5MG/TABS | 3 days supply | Qty: 15 | Fill #0

## 2017-06-11 NOTE — Unmapped (Signed)
Discharge Summary    Admit date: 06/08/2017    Discharge date and time: June 11, 2017    Discharge to:  Home    Discharge Service: Surg Transplant [facility])    Discharge Attending Physician: Doyce Loose,*    Discharge  Diagnoses:   - liver abscesses versus masses  - rib pain w/o evidence of fracture    Secondary Diagnosis:   - s/p OLT    OR Procedures: None     Ancillary Procedures: biopsy: liver mass and sent for culture    Discharge Day Services:   The patient was seen and examined by the transplant surgery team on the day of discharge. Vital signs and laboratory values were stable and within normal limits. Surgical wounds were examined. Discharge plan was discussed, instructions were given, and all questions answered.    Subjective   No acute events overnight. Pain Controlled. No fever or chills.    Objective   Patient Vitals for the past 8 hrs:   BP Temp Temp src Pulse Resp SpO2   06/11/17 0826 - - - 86 - -   06/11/17 0740 137/69 36.7 ??C Oral 87 18 97 %   06/11/17 0707 - - - 88 - -   06/11/17 0330 150/70 37.2 ??C Oral 81 17 97 %     I/O this shift:  In: 420 [P.O.:420]  Out: 250 [Urine:250]    Gen - no acute distress, lying in bed  Neuro - no focal deficits  Pulm - normal work of breathing on room air  Abdomen - nontender, nondistended  Extremities - warm and well perfused    Hospital Course:    The patient was admitted on 06/08/2017 after presenting to infectious disease clinic reporting right lower rib pain after a mechanical fall 5 days earlier.  The patient stated that he had been afebrile, but elevated LFTs were noted in clinic.  Consequently, he was placed on empiric cipro/flagyl due to his history of prior liver lesions that were concerning for possible abscesses that were seen on repeat CT scan that day in the ER.  ID was consulted and they recommended rebiopsy/culture of these lesions.  The patient underwent IR guided biopsy w/ culture of those lesions on 06/10/17, and the initial gram stain was negative.  The patient did well after his procedure and worked with OT and PT who determined that he was safe to discharge to his prior level of care at home w/ home health.  During his hospitalization he practiced good pulmonary toilet and was able to pull >1.5 L on IS prior to discharge and was on room air.  He otherwise is being discharged on empiric antibiotics until cultures finalize.    Condition at Discharge: Improved  Discharge Medications:      Medication List      START taking these medications    ciprofloxacin HCl 500 MG tablet  Commonly known as:  CIPRO  Take 1 tablet (500 mg total) by mouth every twelve (12) hours. for 10 days     docusate sodium 100 MG capsule  Commonly known as:  COLACE  Take 1 capsule (100 mg total) by mouth Two (2) times a day.     metroNIDAZOLE 500 MG tablet  Commonly known as:  FLAGYL  Take 1 tablet (500 mg total) by mouth Three (3) times a day. for 10 days     polyethylene glycol 17 gram packet  Commonly known as:  MIRALAX  Take 17 g by mouth  daily.  Start taking on:  06/12/2017        CHANGE how you take these medications    cholecalciferol (vitamin D3) 2,000 unit Cap  1 capsule (2,000 Units total) by PEG Tube route daily.  What changed:  how to take this     digoxin 125 mcg tablet  Commonly known as:  LANOXIN  0.5 tablets (62.5 mcg total) by G-tube route daily.  What changed:  how to take this     * insulin ASPART 100 unit/mL injection pen  Commonly known as:  NovoLOG Flexpen U-100 Insulin  Inject 0.06 mL (6 Units total) under the skin Three (3) times a day before   meals.  What changed:  additional instructions     * insulin ASPART 100 unit/mL injection pen  Commonly known as:  NovoLOG FLEXPEN  Additional mealtime per SSI-  151-200= 2 units,  201-250= 4 units,   251-300=add 6 units, 301-350=8 units, 351-400=10 units, >400 =12 units  What changed:  additional instructions     * oxyCODONE 5 MG immediate release tablet  Commonly known as:  ROXICODONE  What changed:  Another medication with the same name was added. Make sure   you understand how and when to take each.     * oxyCODONE 5 MG immediate release tablet  Commonly known as:  ROXICODONE  Take 1 tablet (5 mg total) by mouth every four (4) hours as needed. for up   to 5 days  What changed:  You were already taking a medication with the same name,   and this prescription was added. Make sure you understand how and when to   take each.        * This list has 4 medication(s) that are the same as other medications   prescribed for you. Read the directions carefully, and ask your doctor or   other care provider to review them with you.            CONTINUE taking these medications    acetaminophen 325 MG tablet  Commonly known as:  TYLENOL  Take 1 tablet (325 mg total) by mouth every four (4) hours as needed for   pain.     blood sugar diagnostic Strp  Commonly known as:  FREESTYLE TEST  by Other route Four (4) times a day (before meals and nightly).     blood-glucose meter kit  Commonly known as:  glucose monitoring kit  Use as instructed     insulin glargine 100 unit/mL (3 mL) injection pen  Commonly known as:  LANTUS  Inject 0.12 mL (12 Units total) under the skin nightly.     lancets Misc  1 each by Miscellaneous route Four (4) times a day (before meals and   nightly).     magnesium (amino acid chelate) 133 mg Tab  Generic drug:  magnesium oxide-Mg AA chelate  TAKE 1 TABLET BY MOUTH TWICE DAILY     melatonin 3 mg Tab  Take 1 tablet (3 mg total) by mouth nightly.     methylPREDNISolone 4 mg tablet  Commonly known as:  MEDROL DOSEPACK  follow package directions. Dispense: 1 pack     mirtazapine 15 MG tablet  Commonly known as:  REMERON  Take 1 tablet (15 mg total) by mouth nightly.     mycophenolate 250 mg capsule  Commonly known as:  CELLCEPT  Take 2 capsules (500 mg total) by mouth Two (2) times a day.     *  NEORAL 100 MG capsule  Generic drug:  cycloSPORINE modified  175 mg BID (three 25 mg capsules and one 100 mg capsule two times a day)     * NEORAL 25 MG capsule  Generic drug:  cycloSPORINE modified  175 mg BID (three 25 mg capsules and one 100 mg capsule two times a day)     omeprazole 20 MG capsule  Commonly known as:  PriLOSEC  Take 1 capsule (20 mg total) by mouth daily.     pen needle, diabetic 32 gauge x 5/32 Ndle  Commonly known as:  ULTICARE PEN NEEDLE  1 each by Miscellaneous route Four (4) times a day (before meals and   nightly).     sildenafil (antihypertensive) 20 mg tablet  Commonly known as:  REVATIO     traMADol 50 mg tablet  Commonly known as:  ULTRAM  Take 1 tablet (50 mg total) by mouth every six (6) hours as needed for   pain.     warfarin 3 MG tablet  Commonly known as:  COUMADIN        * This list has 2 medication(s) that are the same as other medications   prescribed for you. Read the directions carefully, and ask your doctor or   other care provider to review them with you.              Pending Test Results: cultures from 06/10/2017    Discharge Instructions:  Activity:   Activity Instructions     Activity as tolerated       OK to shower (no bath)           Diet:  Diet Instructions     Discharge diet (specify)       Discharge Nutrition Therapy:  General        Other Instructions:  Other Instructions     No dressing needed           Labs and Other Follow-ups after Discharge:  Follow Up instructions and Outpatient Referrals     Call MD for:  difficulty breathing, headache or visual disturbances       Call MD for:  hives       Call MD for:  persistent dizziness or light-headedness       Call MD for:  persistent nausea or vomiting       Call MD for:  redness, tenderness, or signs of infection (pain, swelling, redness, odor or green/yellow discharge around incision site)       Call MD for:  severe uncontrolled pain       Call MD for:  temperature >38.5 Celsius (101.3 Fahrenheit)       Discharge instructions       - Continue to take your ciprofloxacin and metronidazole while we await the results of your liver biopsy/culture  - Continue to use your incentive spirometer to make sure you are taking good deep breaths and you can prevent a pneumonia  - We will schedule you for a follow up appointment in our clinic where we will discuss your test results and whether you need to continue to take your antibiotic  - Do not drive or operate heavy machinery prior to MD clearance, or at any time while taking narcotics.  - Contact your the Transplant Surgery Office [phone]) during business hours or page the Transplant Surgery Resident On Call [phone]) for:     - fever >101 degrees F by mouth, any fever with shaking chills, or other  signs / symptoms of infection    - uncontrolled nausea, vomiting, or diarrhea; inability to have a bowel movement for > 3 days.   - any problem that prevents taking medications as scheduled.   - pain uncontrolled with prescribed medication or new pain or tenderness at the surgical site   - sudden weight gain or increase in blood pressure (greater than 140/85)   - shortness of breath, chest pain / discomfort    - new or increasing jaundice    - urinary symptoms including pain / difficulty / burning or tea-colored urine   - any other new or concerning symptoms    - questions regarding your medications or continuing care    Inspect surgical sites at least twice daily, contact Transplant Surgery Office or Transplant Surgery Resident On Call for spreading redness, purulent discharge, or increasing bleeding or drainage, or for separation of wounds.    [facility]dominal [facility]   [phone]  [phone]  8:30 a.m. until 5:00 p.m., [place] Time               Future Appointments:  Appointments which have been scheduled for you    Jun 27, 2017  8:30 AM EDT  (Arrive by 8:15 AM)  NEW MOHS with Coralie Carpen, MD  [facility] MOHS SURGERY [place] [facility] REGION) [address]  [place] [address]  [phone]   Jul 04, 2017  1:00 PM EDT  (Arrive by 12:45 PM)  RETURN VOICE with Vista Deck, MD  [facility] [address] [place] [facility] REGION) [address]  [place] [address]  [phone]   Jul 13, 2017  9:30 AM EDT  (Arrive by 9:00 AM)  LAB ONLY with SURTRA LAB ONLY  [facility] TRANSPLANT SURGERY [facility] REGION) [address]  [place] [address]  [phone]   Jul 13, 2017 10:00 AM EDT  (Arrive by 9:30 AM)  MRI ABDOMEN W WO CONTRA    -UN with [facility] MRI RM 1  IMG MRI [facility]) [address]  [place] [address]  [phone]   On appt date: Bring recent lab work Bring documentation of any metal object implants Take meds as usual Check w/physician if diabetic You will be asked to change into a gown for your safety  On appt date do not: Consume anything 2 hrs Wear metallic items including jewelry (we are not responsible for lost items)  Let us know if pt: Claustrophobic Metal object implant Pregnant Prescribed a sedative On dialysis Allergic to MRI dye/contrast Kidney Failure  (Title:MRIWCNTRST)   Jul 13, 2017 11:20 AM EDT  (Arrive by 10:50 AM)  RETURN PHARMD with Ruth-Ann Maryln Manuel, CPP  [facility] TRANSPLANT SURGERY [place] [facility]) [address]  [place] [address]  [phone]   Jul 13, 2017 12:30 PM EDT  (Arrive by 12:00 PM)  RETURN 15 with Doyce Loose, MD  [facility] TRANSPLANT SURGERY [place] [facility]) [address]  [place] [address]  [phone]   Jul 21, 2017  3:30 PM EDT  (Arrive by 3:15 PM)  RETURN  PULM HYPERTENSION with Jettie Booze, MD  [facility] PULMONARY SPECIALTY CL [place]  [facility] REGION) [address]  [place] [address]  [phone]   Jul 24, 2017  1:00 PM EDT  (Arrive by 12:30 PM)  AT HOME PACEMAKER CHECK with [facility] EP REMOTE MONITORING  [facility] EP REMOTE MONITORING [facility] REGION) [address] [place] [address]  [phone]

## 2017-06-11 NOTE — Unmapped (Signed)
Problem: Patient Care Overview  Goal: Plan of Care Review  Outcome: Progressing  ?? VSS stables remains afebrile  ?? Oxycodone administered per request for back pain, has pain at biopsy site on back and chronic back pain. Fentanyl administered 1x   ?? ABD soft and flat. Gtube clamped  ?? BLE non edematous, refuses SCD sleeve placement   ?? Tolerating solid food with no complaints of N&V  ?? Calorie count maintained   ?? Blood glucose monitoring maintained ACHS  ?? Will continue to monitor   ??   ??

## 2017-06-11 NOTE — Unmapped (Signed)
[facility] at [phone]. Spoke w/ [name]. Referral accepted for [facility] for PT/OT on within 48 hours

## 2017-06-12 LAB — BARTONELLA ANITBODY PANEL
BARTONELLA HENSELAE IGG: <1:128 {titer}
BARTONELLA QUINTANA IGG: <1:128 {titer}

## 2017-06-12 LAB — BARTONELLA QUINTANA IGG: Bartonella quintana Ab.IgG:Titr:Pt:Ser:Qn:IF: <1:128 {titer}

## 2017-06-12 MED ORDER — POLYETHYLENE GLYCOL 3350 17 GRAM ORAL POWDER PACKET
PACK | Freq: Every day | ORAL | 0 refills | 0.00000 days | Status: CP
Start: 2017-06-12 — End: 2017-07-12

## 2017-06-12 NOTE — Unmapped (Addendum)
Patient discharged home and per Dr. Matilde Haymaker to be seen in clinic on 06/22/17, , and hold on repeat ERCP for stent removal at this time. Left VM for patient to make aware of appointment and to call me with any additional questions or concerns.     Wife confirmed patient is feeling better. She verbalized understanding agreed to get labs on patient on 06/15/17 and appointments on 06/22/17.  Per Dr. Matilde Haymaker since MRI is in one month only MRI is needed and not the CT scan. Abdominal MRI scan in one month to reassess lesions and check pt. For HCC per protocol.

## 2017-06-14 LAB — HISTO COMMENT: Lab: NEGATIVE

## 2017-06-15 ENCOUNTER — Ambulatory Visit: Admit: 2017-06-15 | Discharge: 2017-06-16 | Payer: MEDICARE

## 2017-06-15 DIAGNOSIS — Z5181 Encounter for therapeutic drug level monitoring: Secondary | ICD-10-CM

## 2017-06-15 DIAGNOSIS — I480 Paroxysmal atrial fibrillation: Secondary | ICD-10-CM

## 2017-06-15 DIAGNOSIS — I5030 Unspecified diastolic (congestive) heart failure: Secondary | ICD-10-CM

## 2017-06-15 DIAGNOSIS — J942 Hemothorax: Secondary | ICD-10-CM

## 2017-06-15 DIAGNOSIS — M961 Postlaminectomy syndrome, not elsewhere classified: Secondary | ICD-10-CM

## 2017-06-15 DIAGNOSIS — E559 Vitamin D deficiency, unspecified: Secondary | ICD-10-CM

## 2017-06-15 DIAGNOSIS — D630 Anemia in neoplastic disease: Secondary | ICD-10-CM

## 2017-06-15 DIAGNOSIS — C220 Liver cell carcinoma: Principal | ICD-10-CM

## 2017-06-15 DIAGNOSIS — K7460 Unspecified cirrhosis of liver: Secondary | ICD-10-CM

## 2017-06-15 DIAGNOSIS — I272 Pulmonary hypertension, unspecified: Secondary | ICD-10-CM

## 2017-06-15 DIAGNOSIS — Z944 Liver transplant status: Secondary | ICD-10-CM

## 2017-06-15 DIAGNOSIS — B182 Chronic viral hepatitis C: Secondary | ICD-10-CM

## 2017-06-15 DIAGNOSIS — K219 Gastro-esophageal reflux disease without esophagitis: Secondary | ICD-10-CM

## 2017-06-15 DIAGNOSIS — K7290 Hepatic failure, unspecified without coma: Secondary | ICD-10-CM

## 2017-06-15 DIAGNOSIS — G4700 Insomnia, unspecified: Secondary | ICD-10-CM

## 2017-06-15 DIAGNOSIS — Z7901 Long term (current) use of anticoagulants: Secondary | ICD-10-CM

## 2017-06-15 DIAGNOSIS — J9589 Other postprocedural complications and disorders of respiratory system, not elsewhere classified: Secondary | ICD-10-CM

## 2017-06-15 DIAGNOSIS — E46 Unspecified protein-calorie malnutrition: Secondary | ICD-10-CM

## 2017-06-15 DIAGNOSIS — Z930 Tracheostomy status: Secondary | ICD-10-CM

## 2017-06-15 DIAGNOSIS — Z950 Presence of cardiac pacemaker: Secondary | ICD-10-CM

## 2017-06-15 DIAGNOSIS — N400 Enlarged prostate without lower urinary tract symptoms: Secondary | ICD-10-CM

## 2017-06-15 DIAGNOSIS — Z931 Gastrostomy status: Secondary | ICD-10-CM

## 2017-06-15 DIAGNOSIS — D696 Thrombocytopenia, unspecified: Secondary | ICD-10-CM

## 2017-06-15 DIAGNOSIS — Z79899 Other long term (current) drug therapy: Secondary | ICD-10-CM

## 2017-06-15 DIAGNOSIS — K769 Liver disease, unspecified: Secondary | ICD-10-CM

## 2017-06-15 DIAGNOSIS — G894 Chronic pain syndrome: Secondary | ICD-10-CM

## 2017-06-15 DIAGNOSIS — I8510 Secondary esophageal varices without bleeding: Secondary | ICD-10-CM

## 2017-06-15 DIAGNOSIS — K8050 Calculus of bile duct without cholangitis or cholecystitis without obstruction: Secondary | ICD-10-CM

## 2017-06-15 DIAGNOSIS — Z794 Long term (current) use of insulin: Secondary | ICD-10-CM

## 2017-06-15 DIAGNOSIS — E1142 Type 2 diabetes mellitus with diabetic polyneuropathy: Secondary | ICD-10-CM

## 2017-06-15 LAB — COMPREHENSIVE METABOLIC PANEL
ALBUMIN: 3 g/dL — ABNORMAL LOW (ref 3.5–5.0)
ALKALINE PHOSPHATASE: 810 U/L — ABNORMAL HIGH (ref 38–126)
ALT (SGPT): 48 U/L (ref 19–72)
ANION GAP: 6 mmol/L — ABNORMAL LOW (ref 9–15)
AST (SGOT): 29 U/L (ref 19–55)
BILIRUBIN TOTAL: 0.7 mg/dL (ref 0.0–1.2)
BLOOD UREA NITROGEN: 22 mg/dL — ABNORMAL HIGH (ref 7–21)
BUN / CREAT RATIO: 16
CHLORIDE: 105 mmol/L (ref 98–107)
CO2: 25 mmol/L (ref 22.0–30.0)
CREATININE: 1.38 mg/dL — ABNORMAL HIGH (ref 0.70–1.30)
EGFR MDRD AF AMER: >=60 mL/min/{1.73_m2} (ref >=60)
EGFR MDRD NON AF AMER: 51 mL/min/{1.73_m2} — ABNORMAL LOW (ref >=60)
GLUCOSE RANDOM: 153 mg/dL (ref 65–179)
POTASSIUM: 4.3 mmol/L (ref 3.5–5.0)
PROTEIN TOTAL: 5.2 g/dL — ABNORMAL LOW (ref 6.5–8.3)
SODIUM: 136 mmol/L (ref 135–145)

## 2017-06-15 LAB — CBC W/ AUTO DIFF
BASOPHILS ABSOLUTE COUNT: 0 10*9/L (ref 0.0–0.1)
BASOPHILS RELATIVE PERCENT: 0.4 %
EOSINOPHILS ABSOLUTE COUNT: 0.1 10*9/L (ref 0.0–0.4)
EOSINOPHILS RELATIVE PERCENT: 1.1 %
HEMATOCRIT: 29.4 % — ABNORMAL LOW (ref 41.0–53.0)
HEMOGLOBIN: 9.5 g/dL — ABNORMAL LOW (ref 13.5–17.5)
LYMPHOCYTES RELATIVE PERCENT: 14.2 %
MEAN CORPUSCULAR HEMOGLOBIN CONC: 32.4 g/dL (ref 31.0–37.0)
MEAN CORPUSCULAR HEMOGLOBIN: 29.4 pg (ref 26.0–34.0)
MEAN CORPUSCULAR VOLUME: 90.6 fL (ref 80.0–100.0)
MONOCYTES ABSOLUTE COUNT: 0.3 10*9/L (ref 0.2–0.8)
MONOCYTES RELATIVE PERCENT: 5.8 %
NEUTROPHILS ABSOLUTE COUNT: 3.4 10*9/L (ref 2.0–7.5)
NEUTROPHILS RELATIVE PERCENT: 77.2 %
PLATELET COUNT: 193 10*9/L (ref 150–440)
RED BLOOD CELL COUNT: 3.25 10*12/L — ABNORMAL LOW (ref 4.50–5.90)
RED CELL DISTRIBUTION WIDTH: 17.1 % — ABNORMAL HIGH (ref 12.0–15.0)
WBC ADJUSTED: 4.3 10*9/L — ABNORMAL LOW (ref 4.5–11.0)

## 2017-06-15 LAB — MAGNESIUM: Magnesium:MCnc:Pt:Ser/Plas:Qn:: 1.5 — ABNORMAL LOW

## 2017-06-15 LAB — GAMMA GLUTAMYL TRANSFERASE: Gamma glutamyl transferase:CCnc:Pt:Ser/Plas:Qn:: 610 — ABNORMAL HIGH

## 2017-06-15 LAB — PHOSPHORUS: Phosphate:MCnc:Pt:Ser/Plas:Qn:: 2.5 — ABNORMAL LOW

## 2017-06-15 LAB — HEMATOCRIT: Lab: 29.4 — ABNORMAL LOW

## 2017-06-15 LAB — BILIRUBIN DIRECT: Bilirubin.glucuronidated:MCnc:Pt:Ser/Plas:Qn:: 0.5 — ABNORMAL HIGH

## 2017-06-15 LAB — GLUCOSE RANDOM: Glucose:MCnc:Pt:Ser/Plas:Qn:: 153

## 2017-06-15 LAB — INR: Lab: 2.15

## 2017-06-15 LAB — GAMMA GT: GAMMA GLUTAMYL TRANSFERASE: 610 U/L — ABNORMAL HIGH (ref 12–109)

## 2017-06-15 NOTE — Unmapped (Signed)
Called patient about LFT elevation.  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

## 2017-06-16 DIAGNOSIS — K219 Gastro-esophageal reflux disease without esophagitis: Secondary | ICD-10-CM

## 2017-06-16 DIAGNOSIS — E46 Unspecified protein-calorie malnutrition: Secondary | ICD-10-CM

## 2017-06-16 DIAGNOSIS — Z944 Liver transplant status: Secondary | ICD-10-CM

## 2017-06-16 DIAGNOSIS — B182 Chronic viral hepatitis C: Secondary | ICD-10-CM

## 2017-06-16 DIAGNOSIS — J9589 Other postprocedural complications and disorders of respiratory system, not elsewhere classified: Secondary | ICD-10-CM

## 2017-06-16 DIAGNOSIS — I272 Pulmonary hypertension, unspecified: Secondary | ICD-10-CM

## 2017-06-16 DIAGNOSIS — Z7901 Long term (current) use of anticoagulants: Secondary | ICD-10-CM

## 2017-06-16 DIAGNOSIS — I5030 Unspecified diastolic (congestive) heart failure: Secondary | ICD-10-CM

## 2017-06-16 DIAGNOSIS — K7460 Unspecified cirrhosis of liver: Secondary | ICD-10-CM

## 2017-06-16 DIAGNOSIS — M961 Postlaminectomy syndrome, not elsewhere classified: Secondary | ICD-10-CM

## 2017-06-16 DIAGNOSIS — I8510 Secondary esophageal varices without bleeding: Secondary | ICD-10-CM

## 2017-06-16 DIAGNOSIS — K7290 Hepatic failure, unspecified without coma: Secondary | ICD-10-CM

## 2017-06-16 DIAGNOSIS — J942 Hemothorax: Secondary | ICD-10-CM

## 2017-06-16 DIAGNOSIS — G894 Chronic pain syndrome: Secondary | ICD-10-CM

## 2017-06-16 DIAGNOSIS — Z931 Gastrostomy status: Secondary | ICD-10-CM

## 2017-06-16 DIAGNOSIS — N400 Enlarged prostate without lower urinary tract symptoms: Secondary | ICD-10-CM

## 2017-06-16 DIAGNOSIS — D630 Anemia in neoplastic disease: Secondary | ICD-10-CM

## 2017-06-16 DIAGNOSIS — Z794 Long term (current) use of insulin: Secondary | ICD-10-CM

## 2017-06-16 DIAGNOSIS — Z950 Presence of cardiac pacemaker: Secondary | ICD-10-CM

## 2017-06-16 DIAGNOSIS — C220 Liver cell carcinoma: Principal | ICD-10-CM

## 2017-06-16 DIAGNOSIS — E559 Vitamin D deficiency, unspecified: Secondary | ICD-10-CM

## 2017-06-16 DIAGNOSIS — I480 Paroxysmal atrial fibrillation: Secondary | ICD-10-CM

## 2017-06-16 DIAGNOSIS — K8050 Calculus of bile duct without cholangitis or cholecystitis without obstruction: Secondary | ICD-10-CM

## 2017-06-16 DIAGNOSIS — Z930 Tracheostomy status: Secondary | ICD-10-CM

## 2017-06-16 DIAGNOSIS — G4700 Insomnia, unspecified: Secondary | ICD-10-CM

## 2017-06-16 DIAGNOSIS — D696 Thrombocytopenia, unspecified: Secondary | ICD-10-CM

## 2017-06-16 DIAGNOSIS — E1142 Type 2 diabetes mellitus with diabetic polyneuropathy: Secondary | ICD-10-CM

## 2017-06-16 LAB — CYCLOSPORINE, TROUGH: Lab: 1186

## 2017-06-16 NOTE — Unmapped (Signed)
Patient vomited all night on Monday but wife gave him Prilosec before bed and this has resolved with no more vomiting.  Let her know we need to do the ERCP on patient and per cardiology he is to hold coumadin dose for 5 days prior to procedure and then resume coumadin if no issues next day or if oozing seen on ERCP 2 days after procedure. Wife verbalized understanding and stated to call once we schedule ERCP.

## 2017-06-18 DIAGNOSIS — K219 Gastro-esophageal reflux disease without esophagitis: Secondary | ICD-10-CM

## 2017-06-18 DIAGNOSIS — K7290 Hepatic failure, unspecified without coma: Secondary | ICD-10-CM

## 2017-06-18 DIAGNOSIS — Z950 Presence of cardiac pacemaker: Secondary | ICD-10-CM

## 2017-06-18 DIAGNOSIS — I272 Pulmonary hypertension, unspecified: Secondary | ICD-10-CM

## 2017-06-18 DIAGNOSIS — Z944 Liver transplant status: Secondary | ICD-10-CM

## 2017-06-18 DIAGNOSIS — E559 Vitamin D deficiency, unspecified: Secondary | ICD-10-CM

## 2017-06-18 DIAGNOSIS — J9589 Other postprocedural complications and disorders of respiratory system, not elsewhere classified: Secondary | ICD-10-CM

## 2017-06-18 DIAGNOSIS — E46 Unspecified protein-calorie malnutrition: Secondary | ICD-10-CM

## 2017-06-18 DIAGNOSIS — Z931 Gastrostomy status: Secondary | ICD-10-CM

## 2017-06-18 DIAGNOSIS — D696 Thrombocytopenia, unspecified: Secondary | ICD-10-CM

## 2017-06-18 DIAGNOSIS — G4700 Insomnia, unspecified: Secondary | ICD-10-CM

## 2017-06-18 DIAGNOSIS — N400 Enlarged prostate without lower urinary tract symptoms: Secondary | ICD-10-CM

## 2017-06-18 DIAGNOSIS — Z930 Tracheostomy status: Secondary | ICD-10-CM

## 2017-06-18 DIAGNOSIS — B182 Chronic viral hepatitis C: Secondary | ICD-10-CM

## 2017-06-18 DIAGNOSIS — Z7901 Long term (current) use of anticoagulants: Secondary | ICD-10-CM

## 2017-06-18 DIAGNOSIS — I480 Paroxysmal atrial fibrillation: Secondary | ICD-10-CM

## 2017-06-18 DIAGNOSIS — J942 Hemothorax: Secondary | ICD-10-CM

## 2017-06-18 DIAGNOSIS — C220 Liver cell carcinoma: Principal | ICD-10-CM

## 2017-06-18 DIAGNOSIS — K8050 Calculus of bile duct without cholangitis or cholecystitis without obstruction: Secondary | ICD-10-CM

## 2017-06-18 DIAGNOSIS — D630 Anemia in neoplastic disease: Secondary | ICD-10-CM

## 2017-06-18 DIAGNOSIS — I5030 Unspecified diastolic (congestive) heart failure: Secondary | ICD-10-CM

## 2017-06-18 DIAGNOSIS — G894 Chronic pain syndrome: Secondary | ICD-10-CM

## 2017-06-18 DIAGNOSIS — K7460 Unspecified cirrhosis of liver: Secondary | ICD-10-CM

## 2017-06-18 DIAGNOSIS — E1142 Type 2 diabetes mellitus with diabetic polyneuropathy: Secondary | ICD-10-CM

## 2017-06-18 DIAGNOSIS — I8510 Secondary esophageal varices without bleeding: Secondary | ICD-10-CM

## 2017-06-18 DIAGNOSIS — M961 Postlaminectomy syndrome, not elsewhere classified: Secondary | ICD-10-CM

## 2017-06-18 DIAGNOSIS — Z794 Long term (current) use of insulin: Secondary | ICD-10-CM

## 2017-06-19 NOTE — Unmapped (Signed)
We denied patient taking medication prior to labs on 4/4 she agreed to have patient repeat labs tomorrow.

## 2017-06-20 ENCOUNTER — Ambulatory Visit: Admit: 2017-06-20 | Discharge: 2017-06-21 | Payer: MEDICARE

## 2017-06-20 DIAGNOSIS — Z944 Liver transplant status: Principal | ICD-10-CM

## 2017-06-20 DIAGNOSIS — Z5181 Encounter for therapeutic drug level monitoring: Secondary | ICD-10-CM

## 2017-06-20 DIAGNOSIS — Z79899 Other long term (current) drug therapy: Secondary | ICD-10-CM

## 2017-06-20 DIAGNOSIS — K769 Liver disease, unspecified: Secondary | ICD-10-CM

## 2017-06-20 DIAGNOSIS — Z7901 Long term (current) use of anticoagulants: Secondary | ICD-10-CM

## 2017-06-20 LAB — CBC W/ AUTO DIFF
BASOPHILS ABSOLUTE COUNT: 0 10*9/L (ref 0.0–0.1)
BASOPHILS RELATIVE PERCENT: 0.3 %
EOSINOPHILS ABSOLUTE COUNT: 0.1 10*9/L (ref 0.0–0.4)
EOSINOPHILS RELATIVE PERCENT: 2.7 %
HEMOGLOBIN: 9.9 g/dL — ABNORMAL LOW (ref 13.5–17.5)
LARGE UNSTAINED CELLS: 2 % (ref 0–4)
LYMPHOCYTES ABSOLUTE COUNT: 0.8 10*9/L — ABNORMAL LOW (ref 1.5–5.0)
LYMPHOCYTES RELATIVE PERCENT: 20.5 %
MEAN CORPUSCULAR HEMOGLOBIN CONC: 32.4 g/dL (ref 31.0–37.0)
MEAN CORPUSCULAR HEMOGLOBIN: 29.4 pg (ref 26.0–34.0)
MEAN CORPUSCULAR VOLUME: 90.8 fL (ref 80.0–100.0)
MONOCYTES ABSOLUTE COUNT: 0.2 10*9/L (ref 0.2–0.8)
MONOCYTES RELATIVE PERCENT: 5.3 %
NEUTROPHILS ABSOLUTE COUNT: 2.7 10*9/L (ref 2.0–7.5)
NEUTROPHILS RELATIVE PERCENT: 69.5 %
PLATELET COUNT: 191 10*9/L (ref 150–440)
RED BLOOD CELL COUNT: 3.38 10*12/L — ABNORMAL LOW (ref 4.50–5.90)
RED CELL DISTRIBUTION WIDTH: 17.8 % — ABNORMAL HIGH (ref 12.0–15.0)
WBC ADJUSTED: 3.9 10*9/L — ABNORMAL LOW (ref 4.5–11.0)

## 2017-06-20 LAB — PROTIME-INR: PROTIME: 28.1 s — ABNORMAL HIGH (ref 10.2–12.8)

## 2017-06-20 LAB — GAMMA GLUTAMYL TRANSFERASE: Gamma glutamyl transferase:CCnc:Pt:Ser/Plas:Qn:: 766 — ABNORMAL HIGH

## 2017-06-20 LAB — COMPREHENSIVE METABOLIC PANEL
ALBUMIN: 3.2 g/dL — ABNORMAL LOW (ref 3.5–5.0)
ALKALINE PHOSPHATASE: 863 U/L (ref 38–126)
ALT (SGPT): 31 U/L (ref 19–72)
ANION GAP: 7 mmol/L — ABNORMAL LOW (ref 9–15)
AST (SGOT): 21 U/L (ref 19–55)
BILIRUBIN TOTAL: 0.5 mg/dL (ref 0.0–1.2)
BLOOD UREA NITROGEN: 27 mg/dL — ABNORMAL HIGH (ref 7–21)
BUN / CREAT RATIO: 18
CALCIUM: 9.7 mg/dL (ref 8.5–10.2)
CHLORIDE: 104 mmol/L (ref 98–107)
CREATININE: 1.51 mg/dL — ABNORMAL HIGH (ref 0.70–1.30)
EGFR MDRD NON AF AMER: 46 mL/min/{1.73_m2} — ABNORMAL LOW (ref >=60)
GLUCOSE RANDOM: 129 mg/dL (ref 65–179)
POTASSIUM: 4.6 mmol/L (ref 3.5–5.0)
PROTEIN TOTAL: 5.7 g/dL — ABNORMAL LOW (ref 6.5–8.3)
SODIUM: 137 mmol/L (ref 135–145)

## 2017-06-20 LAB — MEAN CORPUSCULAR HEMOGLOBIN CONC: Lab: 32.4

## 2017-06-20 LAB — BILIRUBIN DIRECT: Bilirubin.glucuronidated:MCnc:Pt:Ser/Plas:Qn:: <0.1

## 2017-06-20 LAB — PHOSPHORUS: Phosphate:MCnc:Pt:Ser/Plas:Qn:: 3

## 2017-06-20 LAB — MAGNESIUM: Magnesium:MCnc:Pt:Ser/Plas:Qn:: 1.8

## 2017-06-20 LAB — AST (SGOT): Aspartate aminotransferase:CCnc:Pt:Ser/Plas:Qn:: 21

## 2017-06-20 LAB — INR: Lab: 2.46

## 2017-06-20 NOTE — Unmapped (Signed)
Confirmed with wife patient to stop coumadin 5 days prior to ERCP.

## 2017-06-21 DIAGNOSIS — I480 Paroxysmal atrial fibrillation: Secondary | ICD-10-CM

## 2017-06-21 DIAGNOSIS — N400 Enlarged prostate without lower urinary tract symptoms: Secondary | ICD-10-CM

## 2017-06-21 DIAGNOSIS — C220 Liver cell carcinoma: Principal | ICD-10-CM

## 2017-06-21 DIAGNOSIS — J942 Hemothorax: Secondary | ICD-10-CM

## 2017-06-21 DIAGNOSIS — K8050 Calculus of bile duct without cholangitis or cholecystitis without obstruction: Secondary | ICD-10-CM

## 2017-06-21 DIAGNOSIS — G4700 Insomnia, unspecified: Secondary | ICD-10-CM

## 2017-06-21 DIAGNOSIS — E559 Vitamin D deficiency, unspecified: Secondary | ICD-10-CM

## 2017-06-21 DIAGNOSIS — K7290 Hepatic failure, unspecified without coma: Secondary | ICD-10-CM

## 2017-06-21 DIAGNOSIS — G894 Chronic pain syndrome: Secondary | ICD-10-CM

## 2017-06-21 DIAGNOSIS — Z950 Presence of cardiac pacemaker: Secondary | ICD-10-CM

## 2017-06-21 DIAGNOSIS — K7460 Unspecified cirrhosis of liver: Secondary | ICD-10-CM

## 2017-06-21 DIAGNOSIS — D630 Anemia in neoplastic disease: Secondary | ICD-10-CM

## 2017-06-21 DIAGNOSIS — B182 Chronic viral hepatitis C: Secondary | ICD-10-CM

## 2017-06-21 DIAGNOSIS — K219 Gastro-esophageal reflux disease without esophagitis: Secondary | ICD-10-CM

## 2017-06-21 DIAGNOSIS — J9589 Other postprocedural complications and disorders of respiratory system, not elsewhere classified: Secondary | ICD-10-CM

## 2017-06-21 DIAGNOSIS — I5030 Unspecified diastolic (congestive) heart failure: Secondary | ICD-10-CM

## 2017-06-21 DIAGNOSIS — I8510 Secondary esophageal varices without bleeding: Secondary | ICD-10-CM

## 2017-06-21 DIAGNOSIS — Z944 Liver transplant status: Secondary | ICD-10-CM

## 2017-06-21 DIAGNOSIS — Z930 Tracheostomy status: Secondary | ICD-10-CM

## 2017-06-21 DIAGNOSIS — D696 Thrombocytopenia, unspecified: Secondary | ICD-10-CM

## 2017-06-21 DIAGNOSIS — Z794 Long term (current) use of insulin: Secondary | ICD-10-CM

## 2017-06-21 DIAGNOSIS — E1142 Type 2 diabetes mellitus with diabetic polyneuropathy: Secondary | ICD-10-CM

## 2017-06-21 DIAGNOSIS — Z7901 Long term (current) use of anticoagulants: Secondary | ICD-10-CM

## 2017-06-21 DIAGNOSIS — E46 Unspecified protein-calorie malnutrition: Secondary | ICD-10-CM

## 2017-06-21 DIAGNOSIS — M961 Postlaminectomy syndrome, not elsewhere classified: Secondary | ICD-10-CM

## 2017-06-21 DIAGNOSIS — Z931 Gastrostomy status: Secondary | ICD-10-CM

## 2017-06-21 DIAGNOSIS — I272 Pulmonary hypertension, unspecified: Secondary | ICD-10-CM

## 2017-06-21 LAB — CYCLOSPORINE, TROUGH: Lab: 145

## 2017-06-21 NOTE — Unmapped (Signed)
Per PA Shurney no opening for ERCP to be done sooner then 06/29/17 except for tomorrow morning which is too soon with patient coumadin dosing.  Will continue to monitor LFTs.

## 2017-06-22 ENCOUNTER — Ambulatory Visit
Admit: 2017-06-22 | Discharge: 2017-06-22 | Payer: MEDICARE | Attending: Student in an Organized Health Care Education/Training Program | Primary: Student in an Organized Health Care Education/Training Program

## 2017-06-22 ENCOUNTER — Ambulatory Visit
Admit: 2017-06-22 | Discharge: 2017-06-22 | Payer: MEDICARE | Attending: Pharmacist Clinician (PhC)/ Clinical Pharmacy Specialist | Primary: Pharmacist Clinician (PhC)/ Clinical Pharmacy Specialist

## 2017-06-22 ENCOUNTER — Ambulatory Visit: Admit: 2017-06-22 | Discharge: 2017-06-22 | Payer: MEDICARE

## 2017-06-22 DIAGNOSIS — Z7901 Long term (current) use of anticoagulants: Secondary | ICD-10-CM

## 2017-06-22 DIAGNOSIS — K769 Liver disease, unspecified: Secondary | ICD-10-CM

## 2017-06-22 DIAGNOSIS — Z944 Liver transplant status: Principal | ICD-10-CM

## 2017-06-22 DIAGNOSIS — Z79899 Other long term (current) drug therapy: Secondary | ICD-10-CM

## 2017-06-22 DIAGNOSIS — G8929 Other chronic pain: Secondary | ICD-10-CM

## 2017-06-22 DIAGNOSIS — M5440 Lumbago with sciatica, unspecified side: Secondary | ICD-10-CM

## 2017-06-22 DIAGNOSIS — G629 Polyneuropathy, unspecified: Principal | ICD-10-CM

## 2017-06-22 DIAGNOSIS — Z5181 Encounter for therapeutic drug level monitoring: Secondary | ICD-10-CM

## 2017-06-22 LAB — GLUCOSE RANDOM: Glucose:MCnc:Pt:Ser/Plas:Qn:: 92

## 2017-06-22 LAB — COMPREHENSIVE METABOLIC PANEL
ALBUMIN: 3.2 g/dL — ABNORMAL LOW (ref 3.5–5.0)
ALKALINE PHOSPHATASE: 513 U/L — ABNORMAL HIGH (ref 38–126)
ALT (SGPT): 26 U/L (ref 19–72)
ANION GAP: 10 mmol/L (ref 9–15)
AST (SGOT): 14 U/L — ABNORMAL LOW (ref 19–55)
BILIRUBIN TOTAL: 0.4 mg/dL (ref 0.0–1.2)
BLOOD UREA NITROGEN: 24 mg/dL — ABNORMAL HIGH (ref 7–21)
BUN / CREAT RATIO: 15
CHLORIDE: 103 mmol/L (ref 98–107)
CO2: 25 mmol/L (ref 22.0–30.0)
CREATININE: 1.6 mg/dL — ABNORMAL HIGH (ref 0.70–1.30)
EGFR MDRD AF AMER: 52 mL/min/{1.73_m2} — ABNORMAL LOW (ref >=60)
EGFR MDRD NON AF AMER: 43 mL/min/{1.73_m2} — ABNORMAL LOW (ref >=60)
POTASSIUM: 4.6 mmol/L (ref 3.5–5.0)
PROTEIN TOTAL: 5.6 g/dL — ABNORMAL LOW (ref 6.5–8.3)
SODIUM: 138 mmol/L (ref 135–145)

## 2017-06-22 LAB — MAGNESIUM: Magnesium:MCnc:Pt:Ser/Plas:Qn:: 1.8

## 2017-06-22 LAB — GAMMA GLUTAMYL TRANSFERASE: Gamma glutamyl transferase:CCnc:Pt:Ser/Plas:Qn:: 528 — ABNORMAL HIGH

## 2017-06-22 LAB — CBC W/ AUTO DIFF
BASOPHILS ABSOLUTE COUNT: 0 10*9/L (ref 0.0–0.1)
BASOPHILS RELATIVE PERCENT: 0.3 %
EOSINOPHILS ABSOLUTE COUNT: 0.1 10*9/L (ref 0.0–0.4)
EOSINOPHILS RELATIVE PERCENT: 2.1 %
HEMATOCRIT: 31.5 % — ABNORMAL LOW (ref 41.0–53.0)
HEMOGLOBIN: 10 g/dL — ABNORMAL LOW (ref 13.5–17.5)
LARGE UNSTAINED CELLS: 2 % (ref 0–4)
LYMPHOCYTES ABSOLUTE COUNT: 0.8 10*9/L — ABNORMAL LOW (ref 1.5–5.0)
LYMPHOCYTES RELATIVE PERCENT: 21.7 %
MEAN CORPUSCULAR HEMOGLOBIN CONC: 31.7 g/dL (ref 31.0–37.0)
MEAN CORPUSCULAR HEMOGLOBIN: 29 pg (ref 26.0–34.0)
MEAN CORPUSCULAR VOLUME: 91.4 fL (ref 80.0–100.0)
MONOCYTES ABSOLUTE COUNT: 0.2 10*9/L (ref 0.2–0.8)
MONOCYTES RELATIVE PERCENT: 6.4 %
NEUTROPHILS ABSOLUTE COUNT: 2.4 10*9/L (ref 2.0–7.5)
PLATELET COUNT: 205 10*9/L (ref 150–440)
RED BLOOD CELL COUNT: 3.44 10*12/L — ABNORMAL LOW (ref 4.50–5.90)
RED CELL DISTRIBUTION WIDTH: 17 % — ABNORMAL HIGH (ref 12.0–15.0)

## 2017-06-22 LAB — PHOSPHORUS: Phosphate:MCnc:Pt:Ser/Plas:Qn:: 3.2

## 2017-06-22 LAB — BILIRUBIN DIRECT: Bilirubin.glucuronidated:MCnc:Pt:Ser/Plas:Qn:: <0.1

## 2017-06-22 LAB — SMEAR REVIEW

## 2017-06-22 LAB — BASOPHILS RELATIVE PERCENT: Lab: 0.3

## 2017-06-22 LAB — PROTIME-INR: INR: 2.14

## 2017-06-22 LAB — VITAMIN D, TOTAL (25OH): Lab: 50.3

## 2017-06-22 LAB — INR: Lab: 2.14

## 2017-06-22 LAB — CYCLOSPORINE, TROUGH: Lab: 253

## 2017-06-22 MED ORDER — MIRTAZAPINE 15 MG TABLET
ORAL_TABLET | Freq: Every evening | ORAL | 3 refills | 0.00000 days | Status: CP
Start: 2017-06-22 — End: 2017-06-22

## 2017-06-22 MED ORDER — MAGNESIUM OXIDE-MAGNESIUM AMINO ACID CHELATE 133 MG TABLET: 2 | tablet | Freq: Two times a day (BID) | 0 refills | 0 days

## 2017-06-22 MED ORDER — CIPROFLOXACIN 500 MG TABLET
ORAL_TABLET | Freq: Two times a day (BID) | ORAL | 0 refills | 0.00000 days | Status: CP
Start: 2017-06-22 — End: 2017-06-22

## 2017-06-22 MED ORDER — CIPROFLOXACIN 500 MG TABLET: 500 mg | tablet | 0 refills | 0 days

## 2017-06-22 MED ORDER — METRONIDAZOLE 500 MG TABLET
ORAL_TABLET | Freq: Three times a day (TID) | ORAL | 0 refills | 0 days | Status: CP
Start: 2017-06-22 — End: 2017-06-22

## 2017-06-22 MED ORDER — METRONIDAZOLE 500 MG TABLET: 500 mg | each | 0 refills | 0 days

## 2017-06-22 MED ORDER — WARFARIN 1 MG TABLET
ORAL_TABLET | 0 refills | 0 days
Start: 2017-06-22 — End: 2017-09-21

## 2017-06-22 MED ORDER — MIRTAZAPINE 15 MG TABLET: 15 mg | tablet | Freq: Every evening | 3 refills | 0 days | Status: AC

## 2017-06-22 MED ORDER — MIRTAZAPINE 15 MG TABLET: 15 mg | tablet | 3 refills | 0 days | Status: AC

## 2017-06-22 MED FILL — CIPROFLOXACIN/500MG/TABS: CIPROFLOXACIN/500MG/TABS | 7 days supply | Qty: 14 | Fill #0

## 2017-06-22 MED FILL — METRONIDAZOLE/500MG/TAB: METRONIDAZOLE/500MG/TAB | 7 days supply | Qty: 21 | Fill #0

## 2017-06-22 NOTE — Unmapped (Signed)
FOLLOW UP CLINIC NOTE       Date of Service: 06/22/2017    Current complaint: Annual follow up for liver transplant      Assessment and Plan:   Jeffrey Ward is a 67 y.o. 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    - Plan for follow up in one year with CBC, CMP, CXR, and liver US.   - He will continue to get MRI to follow up on [facility] and liver grauloma    History of Present Illness:   Jeffrey Ward is a 67 y.o. 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 He has had multiple changes to health in the past year. He had a long hospitalization and he required an ERCP with stent placement. He had the flu in December and a liver mass from an infection in January. He got antibiotic treatment that ends today, we wil follow up with ID about end date      Physical Exam:  BP 117/85  - Pulse 78  - Temp 36.3 ??C (97.4 ??F) (Temporal)  - Ht 170.2 cm (5' 7)  - Wt 60.4 kg (133 lb 3.2 oz)  - SpO2 100%  - BMI 20.86 kg/m??   General Appearance:  No acute distress, well appearing and well nourished.   Head:  Normocephalic, atraumatic.   Eyes:  No scleral icterus.   Pulmonary:    Normal respiratory effort.   Cardiovascular:  Regular rate and rhythm.   Abdomen:   Abdominal scar well healed without hernia.    Neurologic: Non-focal exam.         Lab Results   Component Value Date    WBC 3.5 (L) 06/22/2017    HGB 10.0 (L) 06/22/2017    HCT 31.5 (L) 06/22/2017    PLT 205 06/22/2017     Lab Results   Component Value Date    NA 138 06/22/2017    K 4.6 06/22/2017    CL 103 06/22/2017    CO2 25.0 06/22/2017    BUN 24 (H) 06/22/2017    CREATININE 1.60 (H) 06/22/2017    CALCIUM 9.5 06/22/2017    MG 1.8 06/22/2017    PHOS 3.2 06/22/2017      Lab Results   Component Value Date    ALKPHOS 513 (H) 06/22/2017    BILITOT 0.4 06/22/2017    BILIDIR <0.10 06/22/2017    PROT 5.6 (L) 06/22/2017    ALBUMIN 3.2 (L) 06/22/2017    ALT 26 06/22/2017    AST 14 (L) 06/22/2017    GGT 528 (H) 06/22/2017      Lab Results   Component Value Date    APTT 34.0 06/08/2017 IMAGING:  Xr Chest Portable    Result Date: 06/10/2017  EXAM: XR CHEST PORTABLE DATE: 06/10/2017 7:35 AM ACCESSION: [id number] UN DICTATED: 06/10/2017 8:24 AM INTERPRETATION LOCATION: [facility] CLINICAL INDICATION: 67 years old Male with SHORTNESS OF BREATH--  COMPARISON: 06/08/2017 TECHNIQUE: Portable upright FINDINGS: Overall, there is no significant change in appearance of the chest.     Ct Fine Needle Aspiration    Addendum Date: 06/13/2017    Report Replacement: The images and report for the original accession number [id number] Jeffrey Ward  are now associated with [id number] UN . lpb    Result Date: 06/13/2017  EXAM: CT-GUIDED PERCUTANEOUS ASPIRATION OF A FLUID COLLECTION DATE: 06/10/2017 10:58 AM ACCESSION: [id number] UN DICTATED: 06/10/2017 11:15 AM INTERPRETATION LOCATION: [facility] CLINICAL INDICATION: 67 years old Male: liver lesions, aspiration vs biopsy-  CONSENT: Informed consent was obtained from the  patient/patient's health care proxy including a discussion of the alternatives, benefits, and risks including but not limited to infection, bleeding, need for additional procedure, and/or ICU admission.  PROCEDURE: The procedure was performed in CT with the patient supine. Limited axial CT images were obtained, demonstrating a small hypoattenuating lesion in the right hepatic lobe consistent with previously sampled abscess. These images were saved and sent to PACS. A skin entry site was identified and was prepped and draped using all elements of maximal sterile barrier technique.  Local anesthesia was achieved with lidocaine .  A 7 cm 21-G needle was inserted into the collection using axial CT images for guidance. After entry into the fluid collection, the needle was aspirated. Aspiration of the needle returned 3 mL of bloody fluid, and a specimen was sent for culture.  The needle was removed and hemostasis was achieved with manual compression. Final CT images were obtained, demonstrating no evidence of hematoma. The puncture site was covered with sterile dressing.  SEDATION: I personally spent 13 minutes, continuously monitoring the patient face-to-face during the administration of moderate sedation. Radiology nurse was present for the duration of the procedure to assist in patient monitoring.  Pre and Post Sedation activities have been reviewed. DOSE LENGTH PRODUCT: 246 mGy*cm ATTENDING: Dr. Ammie Dalton was present for and supervised the procedure. TRAINEE: Dr. Orlando Penner     Successful CT-guided aspiration of small intrahepatic fluid collection.    Ct Aspiration Hematoma Abscess Bul Cyst    Result Date: 06/13/2017  Report Replacement: The images and report for the original accession number [id number] Jeffrey Ward  are now associated with [id number] UN  CT ASPIRATION HEMATOMA ABSCESS BUL CYST. lpb EXAM: CT-GUIDED PERCUTANEOUS ASPIRATION OF A FLUID COLLECTION DATE: 06/10/2017 10:58 AM ACCESSION: [id number] UN DICTATED: 06/10/2017 11:15 AM INTERPRETATION LOCATION: [facility] CLINICAL INDICATION: 67 years old Male: liver lesions, aspiration vs biopsy-  CONSENT: Informed consent was obtained from the patient/patient's health care proxy including a discussion of the alternatives, benefits, and risks including but not limited to infection, bleeding, need for additional procedure, and/or ICU admission.  PROCEDURE: The procedure was performed in CT with the patient supine. Limited axial CT images were obtained, demonstrating a small hypoattenuating lesion in the right hepatic lobe consistent with previously sampled abscess. These images were saved and sent to PACS. A skin entry site was identified and was prepped and draped using all elements of maximal sterile barrier technique.  Local anesthesia was achieved with lidocaine .  A 7 cm 21-G needle was inserted into the collection using axial CT images for guidance. After entry into the fluid collection, the needle was aspirated. Aspiration of the needle returned 3 mL of bloody fluid, and a specimen was sent for culture.  The needle was removed and hemostasis was achieved with manual compression. Final CT images were obtained, demonstrating no evidence of hematoma. The puncture site was covered with sterile dressing.  SEDATION: I personally spent 13 minutes, continuously monitoring the patient face-to-face during the administration of moderate sedation. Radiology nurse was present for the duration of the procedure to assist in patient monitoring.  Pre and Post Sedation activities have been reviewed. DOSE LENGTH PRODUCT: 246 mGy*cm ATTENDING: Dr. Ammie Dalton was present for and supervised the procedure. TRAINEE: Dr. Orlando Penner     Successful CT-guided aspiration of small intrahepatic fluid collection.

## 2017-06-22 NOTE — Unmapped (Signed)
[facility] PHARMACY NOTE  06/22/2017   Jeffrey Ward  [id number]    Medication changes today:   1. No changes    Education/Adherence tools provided today:  1.provided updated medication list  2. provided additional pill box education  3. Facilitated medication access for ciprofloxacin, metronidazole, and mirtazapine    Follow up items:  1. F/u on BP and BG logs  2. Follow up Vitamin D level   3. Follow up with mood/depression and back pain    Next visit with pharmacy in PRN  ____________________________________________________________________    Jeffrey Ward is a 67 y.o. 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 Recent changes include 2 week history of abdominal pain, nausea, vomiting, and weakness. 2 weeks ago he had decreased appetite, weakness, and fever. Went through ERCP (endoscopy) on 02/13 and found to have gallstones in common bile duct. Had a stent placed in CBD. After this developed severe abdominal and back pain, nausea, vomiting, and poor PO intake. Became very sick on 02/14 and fell 2 times bc of weakness in legs and arms. Over weekend, slowly improving and eating/drinking more    Admitted 3/13-3/17/19 after liver biopsy complicated by hemothorax.  Chest tube was placed and abdominal and liver U/S did not show liver bleed.  Was readmitted 3/28-3/31/19 after LFTs were noted to be elevated in ID clinic. Liver lesion (previously known) were biopsied and cultured 2/2 c/f infection.     Seen by pharmacy today for: medication management; last seen by pharmacy 2 months ago     CC:  Patient complains of back pain that keeps him awake at night     Vitals:    06/22/17 1210   BP: 117/85   Pulse: 78   Temp: 36.3 ??C (97.4 ??F)   SpO2: 100%       No Known Allergies    All medications reviewed and updated.     Medication list includes revisions made during today???s encounter    Outpatient Encounter Medications as of 06/22/2017   Medication Sig Dispense Refill   ??? acetaminophen (TYLENOL) 325 MG tablet Take 1 tablet (325 mg total) by mouth every four (4) hours as needed for pain. 100 tablet 2   ??? blood sugar diagnostic (FREESTYLE TEST) Strp by Other route Four (4) times a day (before meals and nightly). 100 each 11   ??? blood-glucose meter (GLUCOSE MONITORING KIT) kit Use as instructed 1 each 0   ??? cholecalciferol, vitamin D3, 2,000 unit cap 1 capsule (2,000 Units total) by PEG Tube route daily. (Patient taking differently: Take 1 capsule by mouth daily.) 30 capsule 11   ??? ciprofloxacin HCl (CIPRO) 500 MG tablet Take 1 tablet (500 mg total) by mouth Two (2) times a day. for 7 days 14 tablet 0   ??? digoxin (LANOXIN) 125 mcg tablet 0.5 tablets (62.5 mcg total) by G-tube route daily. (Patient taking differently: Take 0.5 tablets by mouth daily.) 15 tablet 11   ??? docusate sodium (COLACE) 100 MG capsule Take 1 capsule (100 mg total) by mouth Two (2) times a  day. 60 capsule 0   ??? insulin ASPART (NOVOLOG FLEXPEN U-100 INSULIN) 100 unit/mL injection pen Inject 0.06 mL (6 Units total) under the skin Three (3) times a day before meals. (Patient taking differently: Inject 6 Units under the skin Three (3) times a day before meals. PLUS CORRECTIONS (Additional mealtime per SSI-  151-200= 2 units,  201-250= 4 units, 251-300=add 6 units, 301-350=8 units, 351-400=10 units, >400 =12 units)) 4 mL 11   ??? insulin ASPART (NOVOLOG FLEXPEN) 100 unit/mL injection pen Additional mealtime per SSI-  151-200= 2 units,  201-250= 4 units, 251-300=add 6 units, 301-350=8 units, 351-400=10 units, >400 =12 units (Patient taking differently: No sig reported) 1.8 mL 0   ??? insulin glargine (LANTUS) 100 unit/mL (3 mL) injection pen Inject 0.12 mL (12 Units total) under the skin nightly. 4 mL 11   ??? lancets Misc 1 each by Miscellaneous route Four (4) times a day (before meals and nightly). 100 each 11   ??? magnesium oxide-Mg AA chelate (MAGNESIUM, AMINO ACID CHELATE,) 133 mg Tab Take 2 tablets by mouth Two (2) times a day. 100 tablet PRN   ??? melatonin 3 mg Tab Take 1 tablet (3 mg total) by mouth nightly. 30 tablet 0   ??? metroNIDAZOLE (FLAGYL) 500 MG tablet Take 1 tablet (500 mg total) by mouth Three (3) times a day. for 7 days 21 tablet 0   ??? mirtazapine (REMERON) 15 MG tablet Take 1 tablet (15 mg total) by mouth nightly. 90 tablet 3   ??? mycophenolate (CELLCEPT) 250 mg capsule Take 2 capsules (500 mg total) by mouth Two (2) times a day. 360 capsule 3   ??? NEORAL 100 mg capsule 175 mg BID (three 25 mg capsules and one 100 mg capsule two times a day) 60 capsule 11   ??? NEORAL 25 mg capsule 175 mg BID (three 25 mg capsules and one 100 mg capsule two times a day) 180 capsule 11   ??? omeprazole (PRILOSEC) 20 MG capsule Take 1 capsule (20 mg total) by mouth daily. 30 capsule 1   ??? pen needle, diabetic (ULTICARE PEN NEEDLE) 32 gauge x 5/32 Ndle 1 each by Miscellaneous route Four (4) times a day (before meals and nightly). 90 each 3   ??? polyethylene glycol (MIRALAX) 17 gram packet Take 17 g by mouth daily. 30 packet 0   ??? sildenafil, antihypertensive, (REVATIO) 20 mg tablet Take 40 mg by mouth Three (3) times a day.     ??? traMADol (ULTRAM) 50 mg tablet Take 1 tablet (50 mg total) by mouth every six (6) hours as needed for pain. 30 tablet 0   ??? warfarin (COUMADIN) 1 MG tablet Take as directed 90 tablet 0   ??? warfarin (COUMADIN) 3 MG tablet Take 3 mg by mouth nightly.      ??? [DISCONTINUED] amoxicillin-clavulanate (AUGMENTIN) 875-125 mg per tablet Take 1 tablet by mouth Two (2) times a day. for 5 days 10 tablet 0   ??? [DISCONTINUED] ciprofloxacin HCl (CIPRO) 500 MG tablet Take 1 tablet (500 mg total) by mouth every twelve (12) hours. for 10 days 20 tablet 0   ??? [DISCONTINUED] lactulose (CHRONULAC) 10 gram/15 mL solution Take 30 mL (20 g total) by mouth Three (3) times a day. Can increase as needed with a goal of 3-5 bowel movements a day. 2700 mL 0   ??? [DISCONTINUED] levoFLOXacin (LEVAQUIN) 250 MG tablet Take 2 tablets (500 mg total) by mouth Two (2) times a day. for 5 days  20 tablet 0   ??? [DISCONTINUED] levoFLOXacin (LEVAQUIN) 500 MG tablet Take 1 tablet (500 mg total) by mouth daily. for 7 days 7 tablet 0   ??? [DISCONTINUED] MAGNESIUM, AMINO ACID CHELATE, 133 mg tablet TAKE 1 TABLET BY MOUTH TWICE DAILY 100 tablet PRN   ??? [DISCONTINUED] methylPREDNISolone (MEDROL DOSEPACK) 4 mg tablet follow package directions. Dispense: 1 pack 1 tablet 0   ??? [DISCONTINUED] metroNIDAZOLE (FLAGYL) 500 MG tablet Take 1 tablet (500 mg total) by mouth Three (3) times a day. for 10 days 30 tablet 0   ??? [DISCONTINUED] mirtazapine (REMERON) 15 MG tablet Take 1 tablet (15 mg total) by mouth nightly. 30 tablet 3   ??? [DISCONTINUED] ondansetron (ZOFRAN) 4 MG tablet Take 2 tablets (8 mg total) by mouth every eight (8) hours as needed for nausea. for up to 15 days 15 tablet 0   ??? [DISCONTINUED] oseltamivir (TAMIFLU) 30 MG capsule Take 1 capsule (30 mg total) by mouth Two (2) times a day. for 5 days 10 capsule 0   ??? [DISCONTINUED] oxyCODONE (ROXICODONE) 5 MG immediate release tablet Take 1 tablet (5 mg total) by mouth every four (4) hours as needed. for up to 5 days 30 tablet 0   ??? [DISCONTINUED] oxyCODONE (ROXICODONE) 5 MG immediate release tablet      ??? [DISCONTINUED] oxyCODONE (ROXICODONE) 5 MG immediate release tablet Take 1 tablet (5 mg total) by mouth every four (4) hours as needed. for up to 5 days 15 tablet 0   ??? [DISCONTINUED] polyethylene glycol (MIRALAX) 17 gram packet 17 g by G-tube route daily as needed. (Patient taking differently: 17 g by G-tube route daily as needed. constipation) 24 each 0   ??? [DISCONTINUED] sulfamethoxazole-trimethoprim (BACTRIM,SEPTRA) 400-80 mg per tablet Take 1 tablet (80 mg of trimethoprim total) by mouth 3 (three) times a week. 27 tablet 0   ??? [DISCONTINUED] traMADol (ULTRAM) 50 mg tablet 1 tablet (50 mg total) by G-tube route every six (6) hours as needed. for up to 5 days 30 tablet 0   ??? [DISCONTINUED] traMADol (ULTRAM) 50 mg tablet Take 1-2 tablets (50-100 mg total) by mouth every six (6) hours as needed. for up to 5 days 40 tablet 0   ??? [DISCONTINUED] warfarin (COUMADIN) 1 MG tablet Take 3 tablets (3 mg total) by mouth daily with evening meal. 90 tablet 0     No facility-administered encounter medications on file as of 06/22/2017.        Induction agent : steroids only    CURRENT IMMUNOSUPPRESSION: cyclosporine 175 mg PO BID cyclosporine goal: 150-200   cellcept250  mg PO bid    steroid free     Patient has occasional HA and tremors have decreased    IMMUNOSUPPRESSION DRUG LEVELS:  Lab Results   Component Value Date    TACROLIMUS 10.6 10/21/2016    TACROLIMUS 11.3 10/20/2016    TACROLIMUS 10.6 10/19/2016     Lab Results   Component Value Date    CYCLO 145 06/20/2017    CYCLO 1,186 (HH) 06/15/2017    CYCLO 93 06/09/2017     No results found for: EVEROLIMUS  No results found for: SIROLIMUS    CSA level is a 13h level    Graft function: stable  DSA: positive (HLA Class 1)  Biopsies to date: 11/07/16 and 05/24/17: liver (negative for rejection)  WBC/ANC:  wnl    Plan: Cyclosporine level is pending. Continue to monitor    ID prophylaxis:   CMV Status: D+/  R+, moderate risk . CMV prophylaxis: valganciclovir 450 mg daily x 3 months completed per protocol  PCP Prophylaxis: bactrim SS 1 tab MWF x 6 months complete  Thrush:  completed in hospital  Patient is  off prophylaxis    Plan: prophylaxis completed. Continue to monitor.    ID: liver lesion with concern for abscess  Meds currently on: will complete ciprofloxacin 500 BID and metronidazole 500 mg TID today  Plan: per ID, ok to stop antibiotics.    CV Prophylaxis: asa 81 mg ,  The 10-year ASCVD risk score Denman George DC Jr., et al., 2013) is: 23.4%  Statin therapy: Indicated  Plan: Consider starting statin next visit once current regimen is under control. Continue to monitor     BP: Goal < 140/90. Clinic vitals reported above  Home BP ranges: 150-170s/60-70s. Patient did not bring BP logs to clinic  Current meds include: sildenafil 40 mg TID  Plan: f/u with BP logs from home and see if any adjustments need to be made. Continue to monitor    Atrial Fibrillation:   Med:  warfarin 3 mg nightly, dogoxin 62.5 mcg daily   INR is 2.14 and is wnl  Plan: Patient wills stop warfarin on 05/25/17 in preparation for ERCP for stent removal on 05/29/17.  Will resume warfarin 2 days after ERCP if no bleeding/oozing. Continue to monitor    Anemia:  H/H:   Lab Results   Component Value Date    HGB 10.0 (L) 06/22/2017     Lab Results   Component Value Date    HCT 31.5 (L) 06/22/2017     Iron panel:  Lab Results   Component Value Date    IRON 169 (H) 11/24/2015    TIBC 190.8 (L) 11/24/2015    FERRITIN 286.0 11/24/2015     Lab Results   Component Value Date    LABIRON 89 (H) 11/24/2015     Prior ESA use: ntd  Plan:Continue to monitor.     DM:   Lab Results   Component Value Date    A1C 6.5 (H) 05/24/2017   . Goal A1c < 7  History of Dm? 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 No. Patient has PCP but was not managing BS at time  Currently on: Novolog 6 units before meals with sliding scale, lantus 12 units nightly  Patient glucose level today was 92. Did not bring BG logs today  Diet: Usually eats 2 meals/day with no limitations.Drinks 5-6 bottles of water/day.   Hypoglycemia: Had 1 reading in the 30s int he past but managed with drinking orange juice. Reports no recent hypoglycemia episodes  Plan: Continue to monitor. Electrolytes: wnl  Meds currently on: Mag 133 mg BID  Plan: Continue to monitor     GI/BM: Pt reports 1-2BM. No heartburn.   Meds currently on: omeprazole 20 mg nightly  Plan: Continue to monitor    Pain: pt reports lower back pain which he uses tylenol twice daily to help with. Does not use the tramadol (uses once daily). Has tried gabapentin and Lyrica in the past but gets too drowsy and does not tolerate  Meds currently on: APAP 500 mg PRN, tramadol 50 mg PRN  Plan: Refer to pain management for evaluation of back pain. Continue to monitor    Depression  Patient started on mirtazapine about 1 month ago.  Wife thinks it has helped to some extent but patient states that he's just tired of  being sick.   Meds: Mirtazapine 15 mg HS  Plan: Continue to monitor.    Bone health:   Vitamin D Level: last level 34.2 (12/26/16). Goal > 30.   Last DEXA results: 11/30/15, scan indicated low bone density   Current meds include: cholecalciferol 2,000 units daily   Plan: Vitamin D level pending, continue supplementation.    Women's/Men's Health:  Jeffrey Ward is a 67 y.o. male. Patient reports no men's/women's health issues  Plan: Continue to monitor    Adherence: Patient has average understanding of medications; was not able to independently identify names/doses of immunosuppressants and OI meds.  Patient  does not fill their own pill box on a regular basis at home (wife manages all medications and she reports being more comfortable with medications now)  Patient brought medication card:yes  Pill box:was correct  Plan: provided basic adherence counseling/intervention    Insomnia:   Meds currently on: melatonin 3 mg per night. Patient reports constant headaches nightly.  Plan: Continue to monitor.    Spent approximately 30 minutes on educating this patient and greater than 50% was spent in direct face to face counseling regarding post transplant medication education. Questions and concerns were address to patient's satisfaction. Patient was reviewed with Dr. Matilde Haymaker who was agreement with the stated plan:     During this visit, the following was completed:   BG log data assessment  BP log data assessment  Labs ordered and evaluated  complex treatment plan >1 DS   Patient education was completed for 11-24 minutes     All questions/concerns were addressed to the patient's satisfaction.  __________________________________________  Cleone Slim, PHARMD  [employer] [employer] [facility]  PAGER [phone]

## 2017-06-22 NOTE — Unmapped (Signed)
Per Dr. Celine Mans spoke with Dr. Reynold Bowen with infectious disease and confirmed patient no longer needs to take antibiotics for liver abscesses.  Left VM for patient to make aware since page was returned after patient had left clinic. Patient requested referral for his back pain to be sent.  Sent per Dr. Matilde Haymaker.

## 2017-06-23 DIAGNOSIS — E1142 Type 2 diabetes mellitus with diabetic polyneuropathy: Secondary | ICD-10-CM

## 2017-06-23 DIAGNOSIS — J9589 Other postprocedural complications and disorders of respiratory system, not elsewhere classified: Secondary | ICD-10-CM

## 2017-06-23 DIAGNOSIS — G4700 Insomnia, unspecified: Secondary | ICD-10-CM

## 2017-06-23 DIAGNOSIS — K8050 Calculus of bile duct without cholangitis or cholecystitis without obstruction: Secondary | ICD-10-CM

## 2017-06-23 DIAGNOSIS — D696 Thrombocytopenia, unspecified: Secondary | ICD-10-CM

## 2017-06-23 DIAGNOSIS — D630 Anemia in neoplastic disease: Secondary | ICD-10-CM

## 2017-06-23 DIAGNOSIS — Z944 Liver transplant status: Secondary | ICD-10-CM

## 2017-06-23 DIAGNOSIS — Z930 Tracheostomy status: Secondary | ICD-10-CM

## 2017-06-23 DIAGNOSIS — C220 Liver cell carcinoma: Principal | ICD-10-CM

## 2017-06-23 DIAGNOSIS — E46 Unspecified protein-calorie malnutrition: Secondary | ICD-10-CM

## 2017-06-23 DIAGNOSIS — Z794 Long term (current) use of insulin: Secondary | ICD-10-CM

## 2017-06-23 DIAGNOSIS — K7460 Unspecified cirrhosis of liver: Secondary | ICD-10-CM

## 2017-06-23 DIAGNOSIS — M961 Postlaminectomy syndrome, not elsewhere classified: Secondary | ICD-10-CM

## 2017-06-23 DIAGNOSIS — G894 Chronic pain syndrome: Secondary | ICD-10-CM

## 2017-06-23 DIAGNOSIS — N400 Enlarged prostate without lower urinary tract symptoms: Secondary | ICD-10-CM

## 2017-06-23 DIAGNOSIS — Z931 Gastrostomy status: Secondary | ICD-10-CM

## 2017-06-23 DIAGNOSIS — I8510 Secondary esophageal varices without bleeding: Secondary | ICD-10-CM

## 2017-06-23 DIAGNOSIS — I480 Paroxysmal atrial fibrillation: Secondary | ICD-10-CM

## 2017-06-23 DIAGNOSIS — I5030 Unspecified diastolic (congestive) heart failure: Secondary | ICD-10-CM

## 2017-06-23 DIAGNOSIS — Z950 Presence of cardiac pacemaker: Secondary | ICD-10-CM

## 2017-06-23 DIAGNOSIS — Z7901 Long term (current) use of anticoagulants: Secondary | ICD-10-CM

## 2017-06-23 DIAGNOSIS — I272 Pulmonary hypertension, unspecified: Secondary | ICD-10-CM

## 2017-06-23 DIAGNOSIS — K219 Gastro-esophageal reflux disease without esophagitis: Secondary | ICD-10-CM

## 2017-06-23 DIAGNOSIS — J942 Hemothorax: Secondary | ICD-10-CM

## 2017-06-23 DIAGNOSIS — B182 Chronic viral hepatitis C: Secondary | ICD-10-CM

## 2017-06-23 DIAGNOSIS — K7290 Hepatic failure, unspecified without coma: Secondary | ICD-10-CM

## 2017-06-23 DIAGNOSIS — E559 Vitamin D deficiency, unspecified: Secondary | ICD-10-CM

## 2017-06-23 NOTE — Unmapped (Signed)
Confirmed with wife referral [facility] and [patient to stop antibiotics.  Also confirmed last dose of coumadin is today with none tomorrow until ERCP.

## 2017-06-23 NOTE — Unmapped (Signed)
Per pharmacy no intervention at this time for his CSA.

## 2017-06-26 NOTE — Unmapped (Signed)
Patient does not need a refill of specialty medication at this time. Moving specialty refill reminder call to appropriate date and removed call attempt data.  Spoke with patient's wife and she states patient still has a little over 2 weeks worth of his transplant medications (Neoral and Mycophenolate). And does not need any refills at this time.

## 2017-06-27 ENCOUNTER — Ambulatory Visit
Admit: 2017-06-27 | Discharge: 2017-06-28 | Payer: MEDICARE | Attending: MOHS-Micrographic Surgery | Primary: MOHS-Micrographic Surgery

## 2017-06-27 DIAGNOSIS — L814 Other melanin hyperpigmentation: Secondary | ICD-10-CM

## 2017-06-27 DIAGNOSIS — C44311 Basal cell carcinoma of skin of nose: Principal | ICD-10-CM

## 2017-06-27 DIAGNOSIS — L578 Other skin changes due to chronic exposure to nonionizing radiation: Secondary | ICD-10-CM

## 2017-06-27 MED ORDER — DOXYCYCLINE MONOHYDRATE 100 MG CAPSULE: capsule | 0 refills | 0 days | Status: AC

## 2017-06-27 MED ORDER — DOXYCYCLINE MONOHYDRATE 100 MG CAPSULE
ORAL_CAPSULE | ORAL | 0 refills | 0.00000 days | Status: CP
Start: 2017-06-27 — End: 2017-06-27

## 2017-06-27 MED ORDER — HYDROCODONE 5 MG-ACETAMINOPHEN 325 MG TABLET
ORAL_TABLET | Freq: Four times a day (QID) | ORAL | 0 refills | 0 days | Status: CP | PRN
Start: 2017-06-27 — End: 2017-09-25

## 2017-06-27 MED FILL — DOXYCYCLINE MONOHYDRATE/100MG/CAP: DOXYCYCLINE MONOHYDRATE/100MG/CAP | 10 days supply | Qty: 20 | Fill #0

## 2017-06-27 MED FILL — DIGOXIN/0.125MG/TABS: DIGOXIN/0.125MG/TABS | 30 days supply | Qty: 15 | Fill #6

## 2017-06-27 MED FILL — HYDROCODONE/APAP/5-325MG/TABS: HYDROCODONE/APAP/5-325MG/TABS | 3 days supply | Qty: 12 | Fill #0

## 2017-06-27 NOTE — Unmapped (Signed)
Jeffrey Carpen, MD    POST-OPERATIVE WOUND CARE INSTRUCTIONS    1. Take it easy for 1 week after your surgery.  Heavy activity can cause bleeding and separation of your wound.  Avoid running, weight lifting and swimming during this time period.  2. Keep the wound elevated as much as possible; if the wound is on your face, try to sleep in a recliner or on extra pillows (do NOT sleep face down).  3. Typical post-op pain consists of soreness or mild tenderness usually lasting less than 48 hours.  This is often relieved with extra-strength Tylenol every 4-6 hours.  Avoid aspirin, NSAIDS (Motrin, Aleve, Advil, etc.), or alcohol for at least the first week (these can cause bleeding).  4. If bleeding occurs, remove bandage then apply firm pressure for 20 minutes (timed by the clock). You can also use an ice-pack to decrease the swelling and bleeding.  If it persists, apply pressure for one more round of 20 minutes.  If it continues, call our office or go to the nearest ER.    7 day bandage change:  1. Do not get the pressure bandage wet.  In 2-3 days, you can remove only the large outer bandage (all white).  If the bandage has become soaked with blood or has loosened before this time, you may replace it with a clean bandage.  Please call with any questions.  2. Usually, we will remove the inner flat bandage (all brown) at your follow-up in approximately 1 week (7 days) unless otherwise noted.  3.    After all bandaging is removed follow the cleaning instructions below.  *It is normal to have dried blood or a small amount of bleeding when the pressure bandage is removed.    3 day bandage change:  1. Remove all bandaging (both outer and inner) in 72 hours (3 days).  2.   After all bandages are removed follow the cleaning instructions below.  *It is normal to have dried blood or a small amount of bleeding when the bandage is removed.    Cleaning:  Do the following every day (TWICE A DAY) for 1 month after all outer and inner bandages are removed:  ? Clean the surgical area with a white vinegar/distilled water solution (1 tsp white vinegar in 1 cup of distilled water).  Soak cotton balls/q-tips in this solution and saturate the wound for 2 minutes.  Use a soaked cotton ball or q-tip to remove any crust or drainage.  Gently pat dry.  Crusts and scabs slow down wound healing; remove as much as you can.  ? Apply Vaseline ointment or Polysporin (not Neosporin) to the wound and over any incision lines, stitches, and the surrounding area.  Be generous with the Vaseline and do not let the wound become dry and crusted.   ? Cover with a Band-Aid/bandage if needed, especially if you will be in a dirty or dusty environment.  Check the wound for extensive swelling, bleeding or redness.  It is normal to have some mild oozing, swelling and redness for a few days after surgery.  If the wound appears infected (increasing redness, significant warmth, extreme tenderness, thick yellow discharge), call our office.    *We recommend using a new bottle of distilled water, a new bottle of vinegar and a new container of Vaseline to reduce risk of infection    Important tips and points:  *The better your wound care, the better the final appearance of your surgical site and  any resulting scar.  *Although your wound may appear healed in 1-2 weeks, it takes at least 3 months for complete healing and 12 months for it to take its final appearance.  *Numbness in the surgical area is expected, and it may take 12-18 months for the feeling to return to normal.  During this time sensations of itching, tingling, and occasional sharp pains might be noted.  These feelings are normal and will subside once the nerves in the skin have healed.  *By limiting the tension across your wound for 3 months, you will minimize any wide ???spread scar??? appearance.  Therefore, try to avoid any strenuous exercise or heavy lifting for at least 1 month.  Wounds regain only 30% of their tensile strength at 1 month (and only 80% at one year).  *For wounds around the lips and mouth, avoid any wide opening???sorry, no biting into an apple or hamburger for a while.  Try to cut up your food and take little bites.  *Avoid sun exposure to your wound and scar to prevent any discoloration or prolonged redness.  You may use sunscreen after it has healed with new skin.  Cover it up with Vaseline and a band-aid until new skin has appeared.  *For FLAPS/GRAFTS: A skin flap or skin graft needs a good blood supply to survive.  Avoid any trauma, friction, or rubbing to the area which can disrupt new blood vessels.  Smoking can decrease the blood supply, so please do not smoke.  Keep the surgical site elevated by sleeping in a recliner or on extra pillows (do NOT sleep face-down).    Please call the [facility] if you have any questions: [phone]     (If after-hours, you may page the on-call resident physician at [phone])

## 2017-06-27 NOTE — Unmapped (Signed)
Consult from:    [name], MD [facility], [place], [place])     Reason for visit/CC:   Basal cell carcinoma, right ala  Basal cell carcinoma, right nasal sidewall    HPI: Pt is seen in consultation at the request of the above provider for evaluation and treatment options, including Mohs micrographic surgery, for the above tumor.  It has not been treated in the past. It has been present for 6 months and caused symptoms such as growth, but no pain, bleeding, itching, ulceration, drainage, tingling, or other symptoms.  No other significant lesions of concern.      Medications and allergies: see patient chart.    Review of systems: Reviewed 11 systems and notable for skin cancer as above and hx liver transplant, diabetes, arthritis, pacemaker.  All other systems reviewed are unremarkable/negative, unless noted in the HPI. Past medical history, surgical history, family history, social history were also reviewed and are noted in the chart/questionnaire.      PE: Well-developed, well-nourished, alert and oriented x 3. Vitals stable and documented in chart.  Exam reveals a 1.3 cm ill-defined erythematous patch and biopsy scar on the nasal sidewall and ala (appears biopsy was done at superior and inferior edges, but scar encompasses both). The adjacent skin on the nose, lips, cheeks revealed photodamage, rhytids, telangiectasias, and scattered brown macules.        Assessment:      1) Basal cell carcinoma, right ala and right nasal sidewall (one combined lesion)  2) photodamage  3) solar lentigenes     Plan:   1. Due to size, location, pathology, ill-defined margins and immunosuppression and the likelihood of subclinical extension as well as the need to conserve normal surrounding tissue, the patient and tumor were deemed appropriate for Mohs micrographic surgery (MMS).  The nature and purpose of the procedure, associated benefits and risks including recurrence and scarring, possible complications such as pain, infection, and bleeding, and alternative methods of treatment were discussed with the patient. The diagnosis was verified by reviewing the pathology/report. The lesion location was verified by the patient, by reviewing previous notes, pathology reports, and by photographs and angulation measurements if available.  Informed consent was reviewed and signed by the patient, and timeout was performed at 0858.  2. Rx: Doxycycline 100mg  BID x 10 days, and Hydrocodone/acet 5/325, #12 RF0 16h prn pain  3. For the photodamage and solar lentigenes, sun protection/sun protective behavior discussed and we recommend continued regular follow-up with primary dermatologist every 6 months or sooner for any growing, bleeding, or changing lesions.     Accession: [id number]   [facility] SUR[facility]  Staff surgeon:                              Coralie Carpen, MD  Assistants:                                   Jeanne Ivan, Dr.Holahan  Clinical diagnosis:                        As above  Tumor location:                            As above  Tumor size:  As above  Anesthesia:                                  1% lidocaine with epinephrine  Total volume of anesthesia:         <10ccs  Preparation:                                 2% chlorhexidine, 70% isopropyl alcohol/Chlora-prep  Surgical defect/wound size:         1.6 cm  Postoperative dx:                         Same  Preoperative medications:            None  Perioperative medications:           None  Postoperative medications:          None  EBL:                                             <5ccs  Complications:                             None    Stages:  Stage 1: The patient was positioned, marked, prepped, and anesthetized with local anesthesia as above.  The tumor was first debulked and then excised with a 1-73mm margin.  Hemostasis was achieved with electrocautery as needed.  The specimen was then oriented, subdivided/relaxed, inked, and processed using Mohs technique.  Mohs sections revealed clear margins.      RECONSTRUCTION:  Full thickness skin graft  Operation:  FTSG repair of the above MMS defect  Indication:  Functional and aesthetic reconstruction of the above wound  Donor site:  Left postauricular  Anesthesia:  As above  Total volume of anesthesia: <10ccs  Preparation:  As above  Donor site suture:  5-0 vicryl       FTSG suture:  6-0 fast absorbing gut  Graft length and width:  1.4 x 1.5 cm  Graft surface area:  2.1 cm2  Postoperative medications: Doxycycline 100mg  BID x 10 days and Hydrocodone/acet 5/325, #12 RF0   EBL:  <5ccs  Complications:  None    Due to the size, depth, and location, apposition could not be obtained without significant tension and distortion of adjacent tissue and structures. We discussed repair options at length including graft, flap, and second intention. We chose to proceed with a graft as noted above.  Therefore, a template/measurement of the defect was made to pattern the excision in the donor site that was then aseptically prepped, anesthetized, and excised.  The donor defect was then undermined in all directions and closed so that the subcutaneous and epidermal tissue could be apposed without significant tension.  Hemostasis was achieved with electrocautery.  The graft was then defatted, trimmed, and placed on the recipient site.  The circumference of the graft was secured with the above suture.  Internal bolstering stitches were placed as needed.  The surgical site was then lightly scrubbed with sterile, saline-soaked gauze.  The area was then bandaged using Vaseline ointment, non-adherent gauze, gauze pads, and tape to provide an adequate pressure dressing.  The patient tolerated the procedure well, was given detailed written and verbal wound care instructions, and was discharged in good condition.  Sun protection and sun avoidance was also discussed. The patient will follow-up in 1 week.

## 2017-06-29 ENCOUNTER — Ambulatory Visit: Admit: 2017-06-29 | Discharge: 2017-06-29 | Payer: MEDICARE

## 2017-06-29 ENCOUNTER — Encounter
Admit: 2017-06-29 | Discharge: 2017-06-29 | Payer: MEDICARE | Attending: Nurse Practitioner | Primary: Nurse Practitioner

## 2017-06-29 DIAGNOSIS — T85520A Displacement of bile duct prosthesis, initial encounter: Principal | ICD-10-CM

## 2017-06-29 MED ORDER — LEVOFLOXACIN 500 MG TABLET
ORAL_TABLET | Freq: Every day | ORAL | 0 refills | 0.00000 days | Status: CP
Start: 2017-06-29 — End: 2017-07-04

## 2017-06-29 NOTE — Unmapped (Addendum)
Reviewed ERCP results with PA Vonderau stated to start patient on Levaquin 500 mg daily for five days and restart coumadin on Friday 06/30/17.  Confirmed with NP Martin-Velez doxycycline and Levaquin are safe together.    Patient fell in bathroom today.  But wife reports that he did not hit anything.  Slid off the bed right after.  Wife reports that patient is probably confused.  Patient refused to come to [facility] ER.  Wife agreed if patient gets worse she will bring him to the ER even if he refuses.  Confirmed she will pick up script for Levo at local [facility].  Confirmed medication is in stock.  Paged Dr. Rush Barer to make aware of patient situation.

## 2017-06-30 NOTE — Unmapped (Signed)
Spoke with wife and confirmed patient is fine and no issues with falling over night.  Is not eating as much today.  Going to pick up Levaquin now.

## 2017-07-04 ENCOUNTER — Ambulatory Visit
Admit: 2017-07-04 | Discharge: 2017-07-05 | Payer: MEDICARE | Attending: MOHS-Micrographic Surgery | Primary: MOHS-Micrographic Surgery

## 2017-07-04 ENCOUNTER — Ambulatory Visit
Admit: 2017-07-04 | Discharge: 2017-07-05 | Payer: MEDICARE | Attending: Student in an Organized Health Care Education/Training Program | Primary: Student in an Organized Health Care Education/Training Program

## 2017-07-04 ENCOUNTER — Ambulatory Visit
Admit: 2017-07-04 | Discharge: 2017-07-05 | Payer: MEDICARE | Attending: Infectious Disease | Primary: Infectious Disease

## 2017-07-04 DIAGNOSIS — Z4800 Encounter for change or removal of nonsurgical wound dressing: Principal | ICD-10-CM

## 2017-07-04 DIAGNOSIS — K750 Abscess of liver: Secondary | ICD-10-CM

## 2017-07-04 DIAGNOSIS — Z85828 Personal history of other malignant neoplasm of skin: Secondary | ICD-10-CM

## 2017-07-04 DIAGNOSIS — T8643 Liver transplant infection: Secondary | ICD-10-CM

## 2017-07-04 DIAGNOSIS — Z09 Encounter for follow-up examination after completed treatment for conditions other than malignant neoplasm: Principal | ICD-10-CM

## 2017-07-04 MED ORDER — IVERMECTIN 3 MG TABLET
ORAL_TABLET | ORAL | 0 refills | 0.00000 days | Status: CP
Start: 2017-07-04 — End: 2017-07-04

## 2017-07-04 MED ORDER — IVERMECTIN 3 MG TABLET: tablet | 0 refills | 0 days

## 2017-07-04 MED FILL — IVERMECTIN/3MG/TABS: IVERMECTIN/3MG/TABS | 16 days supply | Qty: 16 | Fill #0

## 2017-07-04 NOTE — Unmapped (Signed)
Coralie Carpen, MD    POST-OPERATIVE WOUND CARE INSTRUCTIONS    1. Take it easy for 1 week after your surgery.  Heavy activity can cause bleeding and separation of your wound.  Avoid running, weight lifting and swimming during this time period.  2. Keep the wound elevated as much as possible; if the wound is on your face, try to sleep in a recliner or on extra pillows (do NOT sleep face down).  3. Typical post-op pain consists of soreness or mild tenderness usually lasting less than 48 hours.  This is often relieved with extra-strength Tylenol every 4-6 hours.  Avoid aspirin, NSAIDS (Motrin, Aleve, Advil, etc.), or alcohol for at least the first week (these can cause bleeding).  4. If bleeding occurs, remove bandage then apply firm pressure for 20 minutes (timed by the clock). You can also use an ice-pack to decrease the swelling and bleeding.  If it persists, apply pressure for one more round of 20 minutes.  If it continues, call our office or go to the nearest ER.    7 day bandage change:  1. Do not get the pressure bandage wet.  In 2-3 days, you can remove only the large outer bandage (all white).  If the bandage has become soaked with blood or has loosened before this time, you may replace it with a clean bandage.  Please call with any questions.  2. Usually, we will remove the inner flat bandage (all brown) at your follow-up in approximately 1 week (7 days) unless otherwise noted.  3.    After all bandaging is removed follow the cleaning instructions below.  *It is normal to have dried blood or a small amount of bleeding when the pressure bandage is removed.    3 day bandage change:  1. Remove all bandaging (both outer and inner) in 72 hours (3 days).  2.   After all bandages are removed follow the cleaning instructions below.  *It is normal to have dried blood or a small amount of bleeding when the bandage is removed.    Cleaning:  Do the following every day (TWICE A DAY) for 1 month after all outer and inner bandages are removed:  ? Clean the surgical area with a white vinegar/distilled water solution (1 tsp white vinegar in 1 cup of distilled water).  Soak cotton balls/q-tips in this solution and saturate the wound for 2 minutes.  Use a soaked cotton ball or q-tip to remove any crust or drainage.  Gently pat dry.  Crusts and scabs slow down wound healing; remove as much as you can.  ? Apply Vaseline ointment or Polysporin (not Neosporin) to the wound and over any incision lines, stitches, and the surrounding area.  Be generous with the Vaseline and do not let the wound become dry and crusted.   ? Cover with a Band-Aid/bandage if needed, especially if you will be in a dirty or dusty environment.  Check the wound for extensive swelling, bleeding or redness.  It is normal to have some mild oozing, swelling and redness for a few days after surgery.  If the wound appears infected (increasing redness, significant warmth, extreme tenderness, thick yellow discharge), call our office.    *We recommend using a new bottle of distilled water, a new bottle of vinegar and a new container of Vaseline to reduce risk of infection    Important tips and points:  *The better your wound care, the better the final appearance of your surgical site and  any resulting scar.  *Although your wound may appear healed in 1-2 weeks, it takes at least 3 months for complete healing and 12 months for it to take its final appearance.  *Numbness in the surgical area is expected, and it may take 12-18 months for the feeling to return to normal.  During this time sensations of itching, tingling, and occasional sharp pains might be noted.  These feelings are normal and will subside once the nerves in the skin have healed.  *By limiting the tension across your wound for 3 months, you will minimize any wide ???spread scar??? appearance.  Therefore, try to avoid any strenuous exercise or heavy lifting for at least 1 month.  Wounds regain only 30% of their tensile strength at 1 month (and only 80% at one year).  *For wounds around the lips and mouth, avoid any wide opening???sorry, no biting into an apple or hamburger for a while.  Try to cut up your food and take little bites.  *Avoid sun exposure to your wound and scar to prevent any discoloration or prolonged redness.  You may use sunscreen after it has healed with new skin.  Cover it up with Vaseline and a band-aid until new skin has appeared.  *For FLAPS/GRAFTS: A skin flap or skin graft needs a good blood supply to survive.  Avoid any trauma, friction, or rubbing to the area which can disrupt new blood vessels.  Smoking can decrease the blood supply, so please do not smoke.  Keep the surgical site elevated by sleeping in a recliner or on extra pillows (do NOT sleep face-down).    Please call the [facility] if you have any questions: [phone]     (If after-hours, you may page the on-call resident physician at [phone])

## 2017-07-04 NOTE — Unmapped (Addendum)
IMMUNOCOMPROMISED HOST INFECTIOUS DISEASE PROGRESS NOTE    Assessment/Plan:   Jeffrey Ward is a 67 y.o. 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  # DM2  - last HA1c 6.5 05/24/2017  ??  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  - 04/26/17 s/p ERCP which revealed CBD stricture and one CBD stone (choledocholithiasis), s/p CBD stent  - 2/21 hepatic lesions incidentally noted on MRI (largest 5.5cm and 3.8cm)  - 3/13 path of core biopsies (no aspiration or micro data) reveal chronic noncaseating granulomatous inflammation, neg special stains  - 3/28 CT scan hepatic lesions decreased in size from 3/13 (3.8 x 3.5cm ->2.8 x 2.3 cm)  - 3/28 bcx x2 ng at 5 days, crypto Ag negative, bartonella serologies negative, histo ag negative  - SRF team began empiric course cipro/metrondazole 3/29-06/20/2017  - 3/30 aspirate obtained after 1d cipro/metro - GS no orgs/PMNs, bacterial cx NG; fungal cx NG; AFB stain negative, cx pending  - 4/16 s/p doxy x 10d for [facility] removal  - 4/18 biliary stent removed, s/p levo 4/18-4/23/19  - 4/23 donor history reviewed - CT liver w contrast 09/13/2017 without liver lesions/granulomas, no TB risk factors  ??  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 We confirmed that he has no animal exposures aside from indoor dog (no farm animals, etc).  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  - Scheduled for follow-up transplant MRI imaging on 07/13/17.  We will use this imaging to assess for f/u assessment of liver abscesses.    Plan to RTC following abdominal imaging to assess liver abscesses and f/u ivermectin initiation.    Thank you for including [place] in the care of this patient.  Discussed and seen with Dr. Reynold Bowen.    Shaiden Aldous L. Annamary Rummage, MD MPH  [facility] Infectious Diseases Fellow    Attending attestation  I saw and evaluated the patient. I agree with the findings and the plan of care as documented in the fellow???s note.  Darlen Round, MD    Addendum 07/14/2017  MRI liver 07/13/2017 reviewed - T1 hypointense, peripherally enhancing hepatic lesions in segment 8 and segment 2 are again identified, measuring up to 2.5 x 2.1 cm (14:34, previously 2.8 x 2.3 cm).   We will continue watchful waiting without further imaging.       Subjective     Interval History:    Has been doing well with slowly improving appetite.  Continues to be fatigued/weak, but is improving.  No fevers, chills, abdominal pain, nausea/vomiting, diarrhea.  Had biliary stent removed 06/29/17 (no stents left at this time).  Was given levofloxacin 500 mg PO x5 days following removal for ppx treatment.  Denies any issues following removal.  Blood sugars are now well-controlled per wife.    Medications:  Antimicrobials:  None currently, completed cipro/metronidazole as detailed above (06/07/17-06/20/17)  Completed levofloxacin (4/18-4/23/19) post-stent removal.  Doxycycline 100mg  bid x 10 days 4/16 after BCC removed from nose    Prior/Current immunomodulators:  Cyclosporine (goal 150-200)  Mycophenolate 500mg  BID  Prior: Tacrolimus, d/c'ed due to neuro side effects (jerking/tremors)    Other medications reviewed.    Objective     Vital signs:  BP 148/70  - Pulse 71  - Temp 36.3 ??C (Temporal)  - Ht 172.7 cm (5' 8)  - Wt 58.5 kg (129 lb)  - SpO2 100%  - BMI 19.61 kg/m??     Physical Exam:  GEN:  appears weak, but otherwise in NAD  EYES: sclerae anicteric and non injected and PERRL, EOMI  ZOX:WRUEAV caries and no thrush, leukoplakia or oral lesions  LYMPH:no cervical LAD  CV:RRR and no peripheral edema  PULM:normal work of breathing at rest and CTAB posteriorly  WUJ:WJXB, NTND  JY:NWGNFAOZ  RECTAL:deferred  HYQ:MVHQ neck ROM  NEURO:no tremor noted, facial expression symmetric and moves extremities equally  PSYCH:attentive, appropriate affect, good eye contact, fluent speech      Labs:  Lab Results   Component Value Date    WBC 3.5 (L) 06/22/2017    WBC 3.9 (L) 06/20/2017    WBC 4.3 (L) 06/15/2017    WBC 2.4 (L) 01/09/2014    WBC 3.3 (L) 10/10/2013    WBC 3.3 (L) 08/15/2013    HGB 10.0 (L) 06/22/2017    HGB 6.2 (L) 09/26/2016    HCT 31.5 (L) 06/22/2017    HCT 35.4 (L) 01/09/2014    PLT 205 06/22/2017    PLT 35 (L) 01/09/2014    NEUTROABS 2.4 06/22/2017    NEUTROABS 1.1 (L) 01/09/2014    LYMPHSABS 0.8 (L) 06/22/2017    LYMPHSABS 0.9 (L) 01/09/2014    EOSABS 0.1 06/22/2017    EOSABS 0.3 01/09/2014    NA 138 06/22/2017    NA 141 09/26/2016    K 4.6 06/22/2017    K 4.1 09/26/2016    BUN 24 (H) 06/22/2017    BUN 12 01/09/2014    CREATININE 1.60 (H) 06/22/2017    CREATININE 1.51 (H) 06/20/2017    CREATININE 1.38 (H) 06/15/2017    CREATININE 1.0 09/25/2015    CREATININE 0.9 04/03/2015    CREATININE 0.87 01/09/2014    CREATININE 1.03 10/10/2013    CREATININE 0.86 08/15/2013    GLU 92 06/22/2017    GLU 101 10/10/2013    MG 1.8 06/22/2017    ALBUMIN 3.2 (L) 06/22/2017    ALBUMIN 2.6 (L) 01/09/2014    BILITOT 0.4 06/22/2017    BILITOT 2.4 (H) 01/09/2014    AST 14 (L) 06/22/2017    AST 46 01/09/2014    ALT 26 06/22/2017    ALT 41 01/09/2014    ALKPHOS 513 (H) 06/22/2017    ALKPHOS 214 (H) 01/09/2014    INR 2.14 06/22/2017    INR 1.3 01/09/2014    ESR 82 (H) 06/08/2017    CRP 70.5 (H) 06/08/2017     Microbiology:    All reviewed, no new positives    Imaging:   None new

## 2017-07-04 NOTE — Unmapped (Signed)
Reason for visit/CC: follow-up surgery   HPI: Mohs micrographic surgery was performed last week for a bcc on the right ala/sidewall. The wound was repaired by full-thickness skin graft and the patient denies any significant complaints or problems.  Bandage came off a few days ago, but he states he has been using vaseline occasionally.    For allergies, medications, past medical history, social history, family history: please see previous note and patient chart/Epic  PE: Well-developed, well-nourished, alert and oriented x 3. Surgical site appears to be healing well with mild crust and erythema. Sutures are intact and there is no significant tenderness, no dehiscence, and no bleeding. Graft with pink-red color, no evidence of necrosis.        Assessment:     Hx of NMSC, s/p Mohs surgery and graft repair, doing well    Plan:  -Bandage removed today and area cleaned  -Continue daily wound care at home with vinegar/water and Vaseline/polysporin per wound care sheet.  -Follow-up PRN with me; and follow-up in 3 months or as scheduled with primary dermatologist

## 2017-07-06 NOTE — Unmapped (Signed)
[facility] Refill Coordination Note  Specialty Medication(s): Neoral 100mg , Neoral 25mg , sildenafil 20MG   Additional Medications shipped: none    Patient's wife stated the didn't need mycophenolate refilled at this point in time - will call to check late next week    Jeffrey Ward, DOB: Dec 04, 1950  Phone: [phone] (home) , Alternate phone contact: N/A  Phone or address changes today?: No  All above HIPAA information was verified with patient's caregiver.  Shipping Address: [address]  [place] [address]   Insurance changes? No    Completed refill call assessment today to schedule patient's medication shipment from the [facility] [phone]).      Confirmed the medication and dosage are correct and have not changed: Yes, regimen is correct and unchanged.    Confirmed patient started or stopped the following medications in the past month:  No, there are no changes reported at this time.    Are you tolerating your medication?:  Jeffrey Ward reports tolerating the medication.    ADHERENCE    Neoral 100 mg   Quantity filled last month: 60   # of tablets left on hand: 7 days  Neoral 25 mg   Quantity filled last month: 180   # of tablets left on hand: 7 days      Did you miss any doses in the past 4 weeks? No missed doses reported.    FINANCIAL/SHIPPING    Delivery Scheduled: Yes, Expected medication delivery date: 07/10/17     The patient will receive an FSI print out for each medication shipped and additional FDA Medication Guides as required.  Patient education from [place] or Robet Leu may also be included in the shipment    [facility] did not have any additional questions at this time.    Delivery address validated in FSI scheduling system: Yes, address listed in FSI is correct.    We will follow up with patient monthly for standard refill processing and delivery.      Thank you,  Lupita Shutter   [facility] Specialty Pharmacist

## 2017-07-06 NOTE — Unmapped (Signed)
Patient doing well.  Wife confirmed she will obtain pt. Labs tomorrow. Provided the contact information [facility] [address] [place] [phone].

## 2017-07-07 ENCOUNTER — Ambulatory Visit: Admit: 2017-07-07 | Discharge: 2017-07-08 | Payer: MEDICARE

## 2017-07-07 DIAGNOSIS — Z7901 Long term (current) use of anticoagulants: Secondary | ICD-10-CM

## 2017-07-07 DIAGNOSIS — Z79899 Other long term (current) drug therapy: Secondary | ICD-10-CM

## 2017-07-07 DIAGNOSIS — Z944 Liver transplant status: Principal | ICD-10-CM

## 2017-07-07 DIAGNOSIS — Z5181 Encounter for therapeutic drug level monitoring: Secondary | ICD-10-CM

## 2017-07-07 DIAGNOSIS — K769 Liver disease, unspecified: Secondary | ICD-10-CM

## 2017-07-07 LAB — CBC W/ AUTO DIFF
BASOPHILS RELATIVE PERCENT: 0.3 %
EOSINOPHILS ABSOLUTE COUNT: 0.1 10*9/L (ref 0.0–0.4)
EOSINOPHILS RELATIVE PERCENT: 1.5 %
HEMATOCRIT: 31.8 % — ABNORMAL LOW (ref 41.0–53.0)
HEMOGLOBIN: 10.3 g/dL — ABNORMAL LOW (ref 13.5–17.5)
LARGE UNSTAINED CELLS: 2 % (ref 0–4)
LYMPHOCYTES RELATIVE PERCENT: 25.9 %
MEAN CORPUSCULAR HEMOGLOBIN CONC: 32.3 g/dL (ref 31.0–37.0)
MEAN CORPUSCULAR HEMOGLOBIN: 30 pg (ref 26.0–34.0)
MEAN CORPUSCULAR VOLUME: 92.8 fL (ref 80.0–100.0)
MEAN PLATELET VOLUME: 8.2 fL (ref 7.0–10.0)
MONOCYTES ABSOLUTE COUNT: 0.3 10*9/L (ref 0.2–0.8)
MONOCYTES RELATIVE PERCENT: 8 %
NEUTROPHILS ABSOLUTE COUNT: 1.9 10*9/L — ABNORMAL LOW (ref 2.0–7.5)
NEUTROPHILS RELATIVE PERCENT: 62.7 %
PLATELET COUNT: 104 10*9/L — ABNORMAL LOW (ref 150–440)
RED BLOOD CELL COUNT: 3.43 10*12/L — ABNORMAL LOW (ref 4.50–5.90)
RED CELL DISTRIBUTION WIDTH: 17.4 % — ABNORMAL HIGH (ref 12.0–15.0)
WBC ADJUSTED: 3.1 10*9/L — ABNORMAL LOW (ref 4.5–11.0)

## 2017-07-07 LAB — INR: Lab: 1.24

## 2017-07-07 LAB — BASOPHILS RELATIVE PERCENT: Lab: 0.3

## 2017-07-07 LAB — COMPREHENSIVE METABOLIC PANEL
ALKALINE PHOSPHATASE: 327 U/L (ref 38–126)
ALT (SGPT): 19 U/L (ref 19–72)
ANION GAP: 7 mmol/L — ABNORMAL LOW (ref 9–15)
AST (SGOT): 14 U/L — ABNORMAL LOW (ref 19–55)
BILIRUBIN TOTAL: 0.7 mg/dL (ref 0.0–1.2)
BLOOD UREA NITROGEN: 33 mg/dL — ABNORMAL HIGH (ref 7–21)
BUN / CREAT RATIO: 27
CALCIUM: 10.1 mg/dL (ref 8.5–10.2)
CHLORIDE: 108 mmol/L — ABNORMAL HIGH (ref 98–107)
CO2: 23 mmol/L (ref 22.0–30.0)
CREATININE: 1.24 mg/dL (ref 0.70–1.30)
EGFR MDRD AF AMER: >=60 mL/min/{1.73_m2} (ref >=60)
EGFR MDRD NON AF AMER: 58 mL/min/{1.73_m2} — ABNORMAL LOW (ref >=60)
GLUCOSE RANDOM: 162 mg/dL (ref 65–179)
POTASSIUM: 4.8 mmol/L (ref 3.5–5.0)
PROTEIN TOTAL: 5.9 g/dL — ABNORMAL LOW (ref 6.5–8.3)
SODIUM: 138 mmol/L (ref 135–145)

## 2017-07-07 LAB — GAMMA GLUTAMYL TRANSFERASE: Gamma glutamyl transferase:CCnc:Pt:Ser/Plas:Qn:: 417 — ABNORMAL HIGH

## 2017-07-07 LAB — PHOSPHORUS: Phosphate:MCnc:Pt:Ser/Plas:Qn:: 3.8

## 2017-07-07 LAB — CHLORIDE: Chloride:SCnc:Pt:Ser/Plas:Qn:: 108 — ABNORMAL HIGH

## 2017-07-07 LAB — PROTIME-INR: PROTIME: 14.1 s — ABNORMAL HIGH (ref 10.2–12.8)

## 2017-07-07 LAB — MAGNESIUM: Magnesium:MCnc:Pt:Ser/Plas:Qn:: 1.8

## 2017-07-07 LAB — BILIRUBIN DIRECT: Bilirubin.glucuronidated:MCnc:Pt:Ser/Plas:Qn:: 0.6 — ABNORMAL HIGH

## 2017-07-07 MED FILL — SILDENAFIL CITRATE/20MG/TABS: SILDENAFIL CITRATE/20MG/TABS | 30 days supply | Qty: 180 | Fill #2

## 2017-07-07 MED FILL — NEORAL/25MG/CAP: NEORAL/25MG/CAP | 30 days supply | Qty: 180 | Fill #1

## 2017-07-07 MED FILL — MIRTAZAPINE/15MG/TABS: MIRTAZAPINE/15MG/TABS | 30 days supply | Qty: 30 | Fill #1

## 2017-07-07 MED FILL — NEORAL/100MG/CAP: NEORAL/100MG/CAP | 30 days supply | Qty: 60 | Fill #1

## 2017-07-07 NOTE — Unmapped (Signed)
Spoke with NP Martin-Velez confirming increase in direct billi need no intervention at this time due to patient recent ERCP.

## 2017-07-08 LAB — CYCLOSPORINE, TROUGH: Lab: 126

## 2017-07-11 NOTE — Unmapped (Signed)
Jeffrey Ward stated to have patient repeat labs since CSA low.  Wife agreed to repeat at [facility] on this Thursday.

## 2017-07-13 ENCOUNTER — Ambulatory Visit: Admit: 2017-07-13 | Discharge: 2017-07-14 | Payer: MEDICARE

## 2017-07-13 ENCOUNTER — Ambulatory Visit
Admit: 2017-07-13 | Discharge: 2017-07-14 | Payer: MEDICARE | Attending: Pharmacist Clinician (PhC)/ Clinical Pharmacy Specialist | Primary: Pharmacist Clinician (PhC)/ Clinical Pharmacy Specialist

## 2017-07-13 ENCOUNTER — Institutional Professional Consult (permissible substitution): Admit: 2017-07-13 | Discharge: 2017-07-14 | Payer: MEDICARE

## 2017-07-13 ENCOUNTER — Ambulatory Visit: Admit: 2017-07-13 | Discharge: 2017-07-14 | Payer: MEDICARE | Attending: Registered" | Primary: Registered"

## 2017-07-13 ENCOUNTER — Ambulatory Visit
Admit: 2017-07-13 | Discharge: 2017-07-14 | Payer: MEDICARE | Attending: Transplant Surgery | Primary: Transplant Surgery

## 2017-07-13 DIAGNOSIS — Z48298 Encounter for aftercare following other organ transplant: Principal | ICD-10-CM

## 2017-07-13 DIAGNOSIS — Z7901 Long term (current) use of anticoagulants: Secondary | ICD-10-CM

## 2017-07-13 DIAGNOSIS — Z09 Encounter for follow-up examination after completed treatment for conditions other than malignant neoplasm: Principal | ICD-10-CM

## 2017-07-13 DIAGNOSIS — Z944 Liver transplant status: Principal | ICD-10-CM

## 2017-07-13 DIAGNOSIS — K769 Liver disease, unspecified: Secondary | ICD-10-CM

## 2017-07-13 DIAGNOSIS — Z79899 Other long term (current) drug therapy: Secondary | ICD-10-CM

## 2017-07-13 DIAGNOSIS — Z5181 Encounter for therapeutic drug level monitoring: Secondary | ICD-10-CM

## 2017-07-13 DIAGNOSIS — C220 Liver cell carcinoma: Secondary | ICD-10-CM

## 2017-07-13 DIAGNOSIS — Z950 Presence of cardiac pacemaker: Principal | ICD-10-CM

## 2017-07-13 LAB — COMPREHENSIVE METABOLIC PANEL
ALBUMIN: 3.5 g/dL (ref 3.5–5.0)
ALKALINE PHOSPHATASE: 225 U/L — ABNORMAL HIGH (ref 38–126)
ANION GAP: 6 mmol/L — ABNORMAL LOW (ref 9–15)
AST (SGOT): 14 U/L — ABNORMAL LOW (ref 19–55)
BILIRUBIN TOTAL: 0.6 mg/dL (ref 0.0–1.2)
BLOOD UREA NITROGEN: 39 mg/dL — ABNORMAL HIGH (ref 7–21)
BUN / CREAT RATIO: 28
CALCIUM: 10.4 mg/dL — ABNORMAL HIGH (ref 8.5–10.2)
CHLORIDE: 105 mmol/L (ref 98–107)
CO2: 27 mmol/L (ref 22.0–30.0)
CREATININE: 1.4 mg/dL — ABNORMAL HIGH (ref 0.70–1.30)
EGFR MDRD AF AMER: >=60 mL/min/{1.73_m2} (ref >=60)
GLUCOSE RANDOM: 92 mg/dL (ref 65–99)
POTASSIUM: 4.9 mmol/L (ref 3.5–5.0)
PROTEIN TOTAL: 6.2 g/dL — ABNORMAL LOW (ref 6.5–8.3)
SODIUM: 138 mmol/L (ref 135–145)

## 2017-07-13 LAB — CBC W/ AUTO DIFF
BASOPHILS ABSOLUTE COUNT: 0 10*9/L (ref 0.0–0.1)
BASOPHILS RELATIVE PERCENT: 0.7 %
EOSINOPHILS ABSOLUTE COUNT: 0.1 10*9/L (ref 0.0–0.4)
EOSINOPHILS RELATIVE PERCENT: 1.4 %
HEMATOCRIT: 35.2 % — ABNORMAL LOW (ref 41.0–53.0)
HEMOGLOBIN: 11.3 g/dL — ABNORMAL LOW (ref 13.5–17.5)
LARGE UNSTAINED CELLS: 2 % (ref 0–4)
LYMPHOCYTES RELATIVE PERCENT: 23.8 %
MEAN CORPUSCULAR HEMOGLOBIN CONC: 32 g/dL (ref 31.0–37.0)
MEAN CORPUSCULAR HEMOGLOBIN: 29.8 pg (ref 26.0–34.0)
MEAN CORPUSCULAR VOLUME: 93 fL (ref 80.0–100.0)
MEAN PLATELET VOLUME: 8.4 fL (ref 7.0–10.0)
MONOCYTES ABSOLUTE COUNT: 0.2 10*9/L (ref 0.2–0.8)
MONOCYTES RELATIVE PERCENT: 5.2 %
PLATELET COUNT: 156 10*9/L (ref 150–440)
RED BLOOD CELL COUNT: 3.78 10*12/L — ABNORMAL LOW (ref 4.50–5.90)
RED CELL DISTRIBUTION WIDTH: 16.5 % — ABNORMAL HIGH (ref 12.0–15.0)
WBC ADJUSTED: 4.4 10*9/L — ABNORMAL LOW (ref 4.5–11.0)

## 2017-07-13 LAB — BLOOD UREA NITROGEN: Urea nitrogen:MCnc:Pt:Ser/Plas:Qn:: 39 — ABNORMAL HIGH

## 2017-07-13 LAB — CYCLOSPORINE, TROUGH: Lab: 302

## 2017-07-13 LAB — ANISOCYTOSIS

## 2017-07-13 LAB — INR: Lab: 1.16

## 2017-07-13 LAB — BILIRUBIN DIRECT: Bilirubin.glucuronidated:MCnc:Pt:Ser/Plas:Qn:: 0.4

## 2017-07-13 LAB — PHOSPHORUS: Phosphate:MCnc:Pt:Ser/Plas:Qn:: 3.8

## 2017-07-13 LAB — GAMMA GLUTAMYL TRANSFERASE: Gamma glutamyl transferase:CCnc:Pt:Ser/Plas:Qn:: 293 — ABNORMAL HIGH

## 2017-07-13 LAB — MAGNESIUM: Magnesium:MCnc:Pt:Ser/Plas:Qn:: 1.8

## 2017-07-13 NOTE — Unmapped (Signed)
Patient has a Medtronic MRI conditional system and is clear for MRI at this time.    GENERATOR INFORMATION  Manufacter &??Model #  Medtronic [id number] Micra VR TCP-Leadless Pacemaker    LEAD INFORMATION  Abandoned/Epicardical Lead(s) ?? No ??                                        TESTING:                                        Sensing (mV):                                       RV       PRE:    11.1      ??  Capture Threshold (V@_ms ):               RV       PRE:    0.50V@0 .                                                                                         Impedance (between 200-1500ohms):                                    RV       PRE: 530                                                                                                     MR CONDITIONAL STATUS AND MANAGEMENT  ??  MR CONDITIONAL SYSTEM    Risks and Potential Complications discussed with patient and patient consents to procedure  System has been implanted more than 6 weeks? Yes   Pacing-Dependent:  No     Mode VVIR lower rate 50 bpm

## 2017-07-13 NOTE — Unmapped (Signed)
Pt left clinic without being seen for his 1:30 appointment.     Jackqulyn Livings MPH, RD, LDN  Pager: [phone]

## 2017-07-13 NOTE — Unmapped (Signed)
MRI with Medtronic Surescan Micra Leadless pacemaker    GENERATOR INFORMATION  Manufacter &??Model #  Medtronic [id number] Micra VR TCP-Leadless Pacemaker    LEAD INFORMATION  Abandoned/Epicardical Lead(s) ?? No ??  ??  TESTING:  ??  Sensing (mV):??????????????????????????????????????????????????????????????????????????  RV??????????????PRE:????????9.5????????POST:  11.5  ??  Capture Threshold (V@_ms ):??????????????????????????  RV??????????????PRE:????????0.50V@0 .24MS????????????????????????POST:??????0.5V@0 . ????????????  ??  Impedance (between 200-1500ohms):????????????????????????????????????????????????????????????????????  RV??????????????PRE:??530????????????????????????????????POST:??????????530??????????????????????????????????????????????????????????????????????????????  ??  MR CONDITIONAL STATUS AND MANAGEMENT  ??  MR CONDITIONAL SYSTEM????  Risks and Potential Complications discussed with patient and patient consents to procedure  System has been implanted more than 6 weeks? Yes   Pacing-Dependent: ??No     Mode VVIR lower rate 50 bpm??    ??  Surescan activated prior to start of scan and mode changed to OVO.  Surescan cancelled at end of scan which turned mode back to VVIR @ 50 bpm as previously set

## 2017-07-13 NOTE — Unmapped (Signed)
Patient seen with Dr. Raelene Bott.  Patient appears to be doing well with improved affect.  He reports increase in appetite and activity level today.  States he drinks one nutritional supplement a day.  Provided 10 minutes of education about importance of high protein diet to help with energy with activity.  States his right chest is sore from the two falls two weeks ago.  Provided wife with the spine canter contact information to set up appointment.  MRI still pending will review with transplant providers with surgery and infectious disease once it results about plan.  If no additional issues arise patient to be seen in 3 months for follow up transplant care at his one year mark.  Will address removing G-tube at that time per Dr. Raelene Bott. Provider made aware of fall history and today's reported dizziness.  Sent message to pharmacist to review medication list to see if anything could be contributing.

## 2017-07-13 NOTE — Unmapped (Signed)
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 At this point he is doing better and we will see what his MRI shows about his liver lesions in the new liver. The antibiotic coverage up to this point should have been sufficient if the cause is infectious in etiology.   He should continue to have PT visit his home as he is frail and continues to be high fall risk.     - Liver MRI read is pending, we will contact [name] if the results are abnormal.     We will see him back in clinic in 3 months for the 1 year follow up visit.     History of Present Illness:   Jeffrey Ward is a 67 y.o. male who underwent OLT on 09/15/16. He has had multiple admissions and complications since the transplant and has fallen at home several times. He has improved in health since his last visit here. He took antibiotics (cipro, flagyl) up until his ERCP on 06/29/17. 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   He feels well for the most part but occasionally feels weak and falls over without knowing why. He is able to eat by mouth regularly and is interested in removing G tube when possible.       Physical Exam:  BP 133/93  - Pulse 70  - Temp 36.7 ??C (98.1 ??F) (Tympanic)  - Ht 172.7 cm (5' 7.99)  - Wt 58.6 kg (129 lb 3.2 oz)  - BMI 19.65 kg/m??   General Appearance:  No acute distress, well appearing and well nourished.   Head:  Normocephalic, atraumatic.   Eyes:  No scleral icterus.   Pulmonary:    Normal respiratory effort.   Cardiovascular:  Regular rate and rhythm.   Abdomen:   Abdominal scar well healed without hernia.    Neurologic: Non-focal exam.         Lab Results   Component Value Date    WBC 4.4 (L) 07/13/2017    HGB 11.3 (L) 07/13/2017    HCT 35.2 (L) 07/13/2017    PLT 156 07/13/2017     Lab Results   Component Value Date    NA 138 07/13/2017    K 4.9 07/13/2017    CL 105 07/13/2017    CO2 27.0 07/13/2017    BUN 39 (H) 07/13/2017    CREATININE 1.40 (H) 07/13/2017    CALCIUM 10.4 (H) 07/13/2017    MG 1.8 07/13/2017    PHOS 3.8 07/13/2017      Lab Results   Component Value Date    ALKPHOS 225 (H) 07/13/2017    BILITOT 0.6 07/13/2017    BILIDIR 0.40 07/13/2017    PROT 6.2 (L) 07/13/2017    ALBUMIN 3.5 07/13/2017    ALT 19 07/13/2017    AST 14 (L) 07/13/2017    GGT 293 (H) 07/13/2017      Lab Results   Component Value Date    APTT 34.0 06/08/2017          IMAGING:  CT abdomen/pelvis with IV contrast (06/08/17) - -Interval mild to moderate decrease in size of hepatic abscesses. -Moderate-sized pleural effusion with simple fluid density which is stable  in size compared to prior chest CT. -Decreased pneumobilia and moderate intrahepatic ductal dilatation.    MRI abdomen (07/13/17) - pending

## 2017-07-14 NOTE — Unmapped (Signed)
Reviewed MRI with Dr. Reynold Bowen with transplant infectious disease and Dr. Raelene Bott transplant surgeon and both confirmed to continue to monitor with MRI at 12 month appointment.

## 2017-07-14 NOTE — Unmapped (Signed)
Per PharmD Mincemoyer no need to change coumadin dosing at this time.

## 2017-07-17 NOTE — Unmapped (Signed)
[facility] Refill Coordination Note    Specialty Medication(s) to be Shipped:   Transplant: mycophenolate mofetil 250MG mg    Other medication(s) to be shipped:  WARFARIN 3MG      Jeffrey Ward, DOB: 1950-05-03  Phone: [phone] (home)   Shipping Address: [address]  [place] [address]    All above HIPAA information was verified with patient's family member.     Completed refill call assessment today to schedule patient's medication shipment from the [facility] [phone]).       Specialty medication(s) and dose(s) confirmed: Regimen is correct and unchanged.   Changes to medications: Jeffrey Ward reports starting the following medications: ANTIBIOTIC  Changes to insurance: No  Questions for the pharmacist: No    The patient will receive an FSI print out for each medication shipped and additional FDA Medication Guides as required.  Patient education from [place] or Robet Leu may also be included in the shipment.    DISEASE-SPECIFIC INFORMATION        N/A    ADHERENCE     Medication Adherence    Support network for adherence:  family member              MEDICARE PART B DOCUMENTATION         SHIPPING     Shipping address confirmed in FSI.     Delivery Scheduled: Yes, Expected medication delivery date: 051019 via UPS or courier.     [name]   [facility] Specialty Technician

## 2017-07-19 ENCOUNTER — Ambulatory Visit: Admit: 2017-07-19 | Discharge: 2017-07-20 | Payer: MEDICARE

## 2017-07-19 DIAGNOSIS — K766 Portal hypertension: Secondary | ICD-10-CM

## 2017-07-19 DIAGNOSIS — Z7901 Long term (current) use of anticoagulants: Secondary | ICD-10-CM

## 2017-07-19 DIAGNOSIS — K769 Liver disease, unspecified: Secondary | ICD-10-CM

## 2017-07-19 DIAGNOSIS — I2721 Secondary pulmonary arterial hypertension: Secondary | ICD-10-CM

## 2017-07-19 DIAGNOSIS — Z79899 Other long term (current) drug therapy: Secondary | ICD-10-CM

## 2017-07-19 DIAGNOSIS — R0600 Dyspnea, unspecified: Secondary | ICD-10-CM

## 2017-07-19 DIAGNOSIS — Z944 Liver transplant status: Principal | ICD-10-CM

## 2017-07-19 DIAGNOSIS — Z5181 Encounter for therapeutic drug level monitoring: Secondary | ICD-10-CM

## 2017-07-19 LAB — COMPREHENSIVE METABOLIC PANEL
ALBUMIN: 3.6 g/dL (ref 3.5–5.0)
ALKALINE PHOSPHATASE: 155 U/L — ABNORMAL HIGH (ref 38–126)
ALT (SGPT): 13 U/L — ABNORMAL LOW (ref 19–72)
AST (SGOT): 18 U/L — ABNORMAL LOW (ref 19–55)
BILIRUBIN TOTAL: 0.7 mg/dL (ref 0.0–1.2)
BLOOD UREA NITROGEN: 40 mg/dL — ABNORMAL HIGH (ref 7–21)
BUN / CREAT RATIO: 28
CALCIUM: 9.8 mg/dL (ref 8.5–10.2)
CHLORIDE: 108 mmol/L — ABNORMAL HIGH (ref 98–107)
CO2: 22 mmol/L (ref 22.0–30.0)
CREATININE: 1.41 mg/dL — ABNORMAL HIGH (ref 0.70–1.30)
EGFR MDRD AF AMER: >=60 mL/min/{1.73_m2} (ref >=60)
EGFR MDRD NON AF AMER: 50 mL/min/{1.73_m2} — ABNORMAL LOW (ref >=60)
POTASSIUM: 4.6 mmol/L (ref 3.5–5.0)
PROTEIN TOTAL: 6 g/dL — ABNORMAL LOW (ref 6.5–8.3)
SODIUM: 138 mmol/L (ref 135–145)

## 2017-07-19 LAB — CBC W/ AUTO DIFF
BASOPHILS ABSOLUTE COUNT: 0 10*9/L (ref 0.0–0.1)
BASOPHILS RELATIVE PERCENT: 0.4 %
EOSINOPHILS ABSOLUTE COUNT: 0.1 10*9/L (ref 0.0–0.4)
EOSINOPHILS RELATIVE PERCENT: 3 %
HEMATOCRIT: 32 % — ABNORMAL LOW (ref 41.0–53.0)
HEMOGLOBIN: 10.5 g/dL — ABNORMAL LOW (ref 13.5–17.5)
LARGE UNSTAINED CELLS: 2 % (ref 0–4)
LYMPHOCYTES ABSOLUTE COUNT: 1.2 10*9/L — ABNORMAL LOW (ref 1.5–5.0)
LYMPHOCYTES RELATIVE PERCENT: 39.7 %
MEAN CORPUSCULAR HEMOGLOBIN CONC: 32.8 g/dL (ref 31.0–37.0)
MEAN CORPUSCULAR VOLUME: 91.4 fL (ref 80.0–100.0)
MEAN PLATELET VOLUME: 8.2 fL (ref 7.0–10.0)
MONOCYTES ABSOLUTE COUNT: 0.2 10*9/L (ref 0.2–0.8)
MONOCYTES RELATIVE PERCENT: 6.6 %
NEUTROPHILS ABSOLUTE COUNT: 1.4 10*9/L — ABNORMAL LOW (ref 2.0–7.5)
NEUTROPHILS RELATIVE PERCENT: 48.4 %
PLATELET COUNT: 107 10*9/L — ABNORMAL LOW (ref 150–440)
RED CELL DISTRIBUTION WIDTH: 16.8 % — ABNORMAL HIGH (ref 12.0–15.0)

## 2017-07-19 LAB — CALCIUM: Calcium:MCnc:Pt:Ser/Plas:Qn:: 9.8

## 2017-07-19 LAB — PHOSPHORUS: Phosphate:MCnc:Pt:Ser/Plas:Qn:: 3.3

## 2017-07-19 LAB — BILIRUBIN DIRECT: Bilirubin.glucuronidated:MCnc:Pt:Ser/Plas:Qn:: 0.4

## 2017-07-19 LAB — MEAN CORPUSCULAR VOLUME: Lab: 91.4

## 2017-07-19 LAB — GAMMA GLUTAMYL TRANSFERASE: Gamma glutamyl transferase:CCnc:Pt:Ser/Plas:Qn:: 188 — ABNORMAL HIGH

## 2017-07-19 LAB — MAGNESIUM: Magnesium:MCnc:Pt:Ser/Plas:Qn:: 1.9

## 2017-07-19 LAB — BILIRUBIN, DIRECT: BILIRUBIN DIRECT: 0.4 mg/dL (ref 0.00–0.40)

## 2017-07-19 NOTE — Unmapped (Signed)
Patient repeating labs this week today.

## 2017-07-20 LAB — CYCLOSPORINE, TROUGH: Lab: 95

## 2017-07-20 MED FILL — MYCOPHENOLATE MOFETIL/250MG/CAPS: MYCOPHENOLATE MOFETIL/250MG/CAPS | 30 days supply | Qty: 120 | Fill #4

## 2017-07-20 MED FILL — WARFARIN/3MG/TAB: WARFARIN/3MG/TAB | 30 days supply | Qty: 30 | Fill #4

## 2017-07-21 ENCOUNTER — Ambulatory Visit: Admit: 2017-07-21 | Discharge: 2017-07-22 | Payer: MEDICARE

## 2017-07-21 DIAGNOSIS — I2721 Secondary pulmonary arterial hypertension: Secondary | ICD-10-CM

## 2017-07-21 DIAGNOSIS — K766 Portal hypertension: Secondary | ICD-10-CM

## 2017-07-21 DIAGNOSIS — R0600 Dyspnea, unspecified: Secondary | ICD-10-CM

## 2017-07-21 LAB — PRO-BNP: Natriuretic peptide.B prohormone N-Terminal:MCnc:Pt:Ser/Plas:Qn:: 197

## 2017-07-21 NOTE — Unmapped (Signed)
[facility] at [facility]   Pulmonary Hypertension Program                                               Lattie Haw, MD???Director (Pulmonary)     Denice Bors. Tresa Res, MD--Associate Director (Pulmonary)    Freeman Caldron, MD (Cardiology)     Esmond Plants, DO (Pulmonary)    Marva Panda, RN Nurse Coordinator     Claretha Cooper, RN Nurse Coordinator    [phone] office - [phone] fax          Jeffrey Ward  is a 67 y.o. male  patient referred by [name], MD for work up and evaluation for pulmonary hypertension.    PAH Treatment History:  Sildenafil 40 mg tid since post liver txplt hospitalization.    Pt describes a history of recent liver txplt in 09/2016.  Post op was noted to have previously unrecognized PAH and RV failure, treated initially with inhaled iloprost while on vent support and then transitioned to sildenafil.  He had a long post op course requiring a stay in rehab of about 2 weeks prior to discharge.   His post op course was further complicated by AKI (on CRRT then had renal recovery), afib on anticoagulation currently, bradycardia requiring pacemaker placement.  CTA negative for PE post op.      No LE edema currently.      He continues on sildenafil 40 tid and is receiving from [facility] pharmacy.      No syncope or near syncope.        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      The patient's current NYHA functional class is Class 2 - comfortable at rest but have symptoms with ordinary physcial activity.     Last echo 04/2017 very reassuring from right heart standpoint.  Plan continue sildenafil at current dose.        Re-educated patient and spouse on importance of not stopping sildenafil, as PH may worsen, leading to hepatic congestion and potential rejection of liver graft.      Plan:          1) continue sildenafili 40 mg tid.      2) next visit in 4 months. 3) add pro-BNP to labs from 07/19/17.      Subjective:      Past medical history is significant for:   Past Medical History:   Diagnosis Date   ??? Atrial fibrillation (CMS-HCC)    ??? Basal cell carcinoma    ??? Cancer (CMS-HCC)    ??? Cirrhosis (CMS-HCC)    ??? Diabetes (CMS-HCC)    ??? Hepatitis C 07/17/2012   ??? Liver disease    ??? Low back pain 07/17/2012   ??? Varices, esophageal (CMS-HCC)       Family History   Problem Relation Age of Onset   ??? Hypertension Mother    ??? Cirrhosis Neg Hx    ??? Liver cancer Neg Hx    ??? Anemia Neg Hx    ??? Cancer Neg Hx    ??? Diabetes Neg Hx    ??? Kidney disease Neg Hx    ??? Obesity Neg Hx    ??? Thyroid disease Neg Hx    ???  Osteoporosis Neg Hx    ??? Coronary artery disease Neg Hx    ??? Anesthesia problems Neg Hx    ??? Basal cell carcinoma Neg Hx    ??? Squamous cell carcinoma Neg Hx       Social History     Socioeconomic History   ??? Marital status: Married     Spouse name: Not on file   ??? Number of children: Not on file   ??? Years of education: Not on file   ??? Highest education level: Not on file   Occupational History   ??? Not on file   Social Needs   ??? Financial resource strain: Not on file   ??? Food insecurity:     Worry: Not on file     Inability: Not on file   ??? Transportation needs:     Medical: Not on file     Non-medical: Not on file   Tobacco Use   ??? Smoking status: Never Smoker   ??? Smokeless tobacco: Never Used   ??? Tobacco comment: Smoked in high school for about 5 years.    Substance and Sexual Activity   ??? Alcohol use: No   ??? Drug use: No   ??? Sexual activity: Yes     Partners: Female     Birth control/protection: None   Lifestyle   ??? Physical activity:     Days per week: Not on file     Minutes per session: Not on file   ??? Stress: Not on file   Relationships   ??? Social connections:     Talks on phone: Not on file     Gets together: Not on file     Attends religious service: Not on file     Active member of club or organization: Not on file     Attends meetings of clubs or organizations: Not on file Relationship status: Not on file   Other Topics Concern   ??? Do you use sunscreen? Not Asked   ??? Tanning bed use? Not Asked   ??? Are you easily burned? Not Asked   ??? Excessive sun exposure? Not Asked   ??? Blistering sunburns? Not Asked   Social History Narrative   ??? Not on file      Past Surgical History:   Procedure Laterality Date   ??? ANKLE SURGERY     ??? BACK SURGERY     ??? BACK SURGERY     ??? CARDIAC SURGERY      pacemaker   ??? CHG US GUIDE, TISSUE ABLATION N/A 01/22/2016    Procedure: ULTRASOUND GUIDANCE FOR, AND MONITORING OF, PARENCHYMAL TISSUE ABLATION;  Surgeon: Particia Nearing, MD;  Location: MAIN OR [facility];  Service: Transplant   ??? IR EMBOLIZATION ORGAN ISCHEMIA, TUMORS, INFAR  06/16/2016    IR EMBOLIZATION ORGAN ISCHEMIA, TUMORS, INFAR 06/16/2016 Ammie Dalton, MD IMG VIR H&V [facility]   ??? PR COLON CA SCRN NOT HI RSK IND N/A 02/27/2015    Procedure: COLOREC CNCR SCR;COLNSCPY NO;  Surgeon: Vonda Antigua, MD;  Location: GI PROCEDURES MEMORIAL [facility];  Service: Gastroenterology   ??? PR ENDOSCOPIC ULTRASOUND EXAM N/A 02/27/2015    Procedure: UGI ENDO; W/ENDO ULTRASOUND EXAM INCLUDES ESOPHAGUS, STOMACH, &/OR DUODENUM/JEJUNUM;  Surgeon: Vonda Antigua, MD;  Location: GI PROCEDURES MEMORIAL [facility];  Service: Gastroenterology   ??? PR ERCP BALLOON DILATE BILIARY/PANC DUCT/AMPULLA EA N/A 02/10/2017    Procedure: ERCP;WITH TRANS-ENDOSCOPIC BALLOON DILATION OF BILIARY/PANCREATIC DUCT(S) OR OF AMPULLA, INCLUDING SPHINCTERECTOMY,  WHEN PERFOREMD,EACH DUCT (13086);  Surgeon: Mayford Knife, MD;  Location: GI PROC[facility]URES [facility];  Service: Gastroenterology   ??? PR ERCP REMOVE FOREIGN BODY/STENT BILIARY/PANC DUCT N/A 02/10/2017    Procedure: ENDOSCOPIC RETROGRADE CHOLANGIOPANCREATOGRAPHY (ERCP); W/ REMOVAL OF FOREIGN BODY/STENT FROM BILIARY/PANCREATIC DUCT(S);  Surgeon: Mayford Knife, MD;  Location: GI PROCEDURES MEMORIAL [facility];  Service: Gastroenterology   ??? PR ERCP REMOVE FOREIGN BODY/STENT BILIARY/PANC DUCT N/A 06/29/2017 Procedure: ENDOSCOPIC RETROGRADE CHOLANGIOPANCREATOGRAPHY (ERCP); W/ REMOVAL OF FOREIGN BODY/STENT FROM BILIARY/PANCREATIC DUCT(S);  Surgeon: Vonda Antigua, MD;  Location: GI PROCEDURES MEMOR[facility];  Service: Gastroenterology   ??? PR ERCP STENT PLACEMENT BILIARY/PANCREATIC DUCT N/A 11/29/2016    Procedure: ENDOSCOPIC RETROGRADE CHOLANGIOPANCREATOGRAPHY (ERCP); WITH PLACEMENT OF ENDOSCOPIC STENT INTO BILIARY OR PANCREATIC DUCT;  Surgeon: Chriss Driver, MD;  Location: GI PROCEDURES [facility]OR[facility];  Service: Gastroenterology   ??? PR ERCP STENT PLACEMENT BILIARY/PANCREATIC DUCT N/A 04/26/2017    Procedure: ENDOSCOPIC RETROGRADE CHOLANGIOPANCREATOGRAPHY (ERCP); WITH PLACEMENT OF ENDOSCOPIC STENT INTO BILIARY OR PANCREATIC DUCT;  Surgeon: Mayford Knife, MD;  Location: [facility] PROCEDURES [facility];  Service: Gastroenterology   ??? PR ERCP,W/REMOVAL STONE,BIL/PANCR DUCTS N/A 11/29/2016    Procedure: ERCP; W/ENDOSCOPIC RETROGRADE REMOVAL OF CALCULUS/CALCULI FROM BILIARY &/OR PANCREATIC DUCTS;  Surgeon: Chriss Driver, MD;  Location: GI PROCEDURES [facility];  Service: Gastroenterology   ??? PR ERCP,W/REMOVAL STONE,BIL/PANCR DUCTS N/A 02/10/2017    Procedure: ERCP; W/ENDOSCOPIC RETROGRADE REMOVAL OF CALCULUS/CALCULI FROM BILIARY &/OR PANCREATIC DUCTS;  Surgeon: Mayford Knife, MD;  Location: GI PROCEDURES [facility]ORIAL [facility];  Service: Gastroenterology   ??? PR ERCP,W/REMOVAL STONE,BIL/PANCR DUCTS N/A 04/26/2017    Procedure: ERCP; W/ENDOSCOPIC RETROGRADE REMOVAL OF CALCULUS/CALCULI FROM BILIARY &/OR PANCREATIC DUCTS;  Surgeon: Mayford Knife, MD;  Location: GI PROCEDURES MEMORIAL [facility];  Service: Gastroenterology   ??? PR ERCP,W/REMOVAL STONE,BIL/PANCR DUCTS N/A 06/29/2017    Procedure: ERCP; W/ENDOSCOPIC RETROGRADE REMOVAL OF CALCULUS/CALCULI FROM BILIARY &/OR PANCREATIC DUCTS;  Surgeon: Vonda Antigua, MD;  Location: GI PROCEDURES [facility]OR[facility];  Service: Gastroenterology   ??? PR INSER HEART TEMP PACER ONE CHMBR N/A 10/02/2016    Procedure: Tempoarary Pacemaker Insertion;  Surgeon: Meredith Leeds, MD;  Location: [facility];  Service: Cardiology   ??? PR LAP,DIAGNOSTIC ABDOMEN N/A 01/22/2016    Procedure: Laparoscopy, Abdomen, Peritoneum, & Omentum, Diagnostic, W/Wo Collection Specimen(S) By Brushing Or Washing;  Surgeon: Particia Nearing, MD;  Location: MAIN OR [facility];  Service: Transplant   ??? PR PLACE PERCUT GASTROSTOMY TUBE N/A 11/17/2016    Procedure: UGI ENDO; W/DIRECTED PLCMT PERQ GASTROSTOMY TUBE;  Surgeon: Cletis Athens, MD;  Location: GI PROCEDURES [facility];  Service: Gastroenterology   ??? PR TRACHEOSTOMY, PLANNED N/A 09/29/2016    Procedure: TRACHEOSTOMY PLANNED (SEPART PROC);  Surgeon: Katherina Mires, MD;  Location: MAIN OR [facility];  Service: Trauma   ??? PR TRANSCATH INSERT OR REPLACE LEADLESS PM VENTR N/A 10/11/2016    Procedure: Pacemaker Implant/Replace Leadless;  Surgeon: Meredith Leeds, MD;  Location: [facility];  Service: Cardiology   ??? PR TRANSPLANT LIVER,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    Procedure: Liver Allotransplantation; Orthotopic, Partial Or Whole, From Cadaver Or Living Donor, Any Age;  Surgeon: Doyce Loose, MD;  Location: MAIN OR [facility];  Service: Transplant   ??? PR TRANSPLANT,PREP DONOR LIVER, WHOLE N/A 09/15/2016    Procedure: Rogelia Boga Std Prep Cad Donor Whole Liver Gft Prior Tnsplnt,Inc Chole,Diss/Rem Surr Tissu Wo Triseg/Lobe Splt;  Surgeon: Doyce Loose, MD;  Location: MAIN OR [facility];  Service: Transplant   ???  PR UPPER GI ENDOSCOPY,BIOPSY N/A 07/17/2012    Procedure: UGI ENDOSCOPY; WITH BIOPSY, SINGLE OR MULTIPLE;  Surgeon: Alba Destine, MD;  Location: GI PROCEDURES [facility]ORIAL [facility];  Service: Gastroenterology   ??? PR UPPER GI ENDOSCOPY,DIAGNOSIS N/A 02/04/2014    Procedure: UGI ENDO, INCLUDE ESOPHAGUS, STOMACH, & DUODENUM &/OR JEJUNUM; DX W/WO COLLECTION SPECIMN, BY BRUSH OR WASH;  Surgeon: Wilburt Finlay, MD;  Location: GI PROCEDURES MEMORIAL [facility];  Service: Gastroenterology   ??? PR UPPER GI ENDOSCOPY,LIGAT VARIX N/A 11/05/2013    Procedure: UGI ENDO; W/BAND LIG ESOPH &/OR GASTRIC VARICES;  Surgeon: Wilburt Finlay, MD;  Location: GI PROCEDURES [facility]OR[facility];  Service: Gastroenterology        Objective:   Objective   Review of systems is significant for:     As stated in the HPI, remainder of 12 system review is negative.      No Known Allergies   Current Outpatient Medications   Medication Sig Dispense Refill   ??? acetaminophen (TYLENOL) 325 MG tablet Take 1 tablet (325 mg total) by mouth every four (4) hours as needed for pain. 100 tablet 2   ??? blood sugar diagnostic (FREESTYLE TEST) Strp by Other route Four (4) times a day (before meals and nightly). 100 each 11   ??? blood-glucose meter (GLUCOSE MONITORING KIT) kit Use as instructed 1 each 0   ??? cholecalciferol, vitamin D3, 2,000 unit cap 1 capsule (2,000 Units total) by PEG Tube route daily. (Patient taking differently: Take 1 capsule by mouth daily.) 30 capsule 11   ??? digoxin (LANOXIN) 125 mcg tablet 0.5 tablets (62.5 mcg total) by G-tube route daily. (Patient taking differently: Take 0.5 tablets by mouth daily.) 15 tablet 11   ??? doxycycline (MONODOX) 100 MG capsule Take 1 tablet (100 mg) BID x 10 days with food 20 capsule 0   ??? HYDROcodone-acetaminophen (NORCO) 5-325 mg per tablet Take 1 tablet by mouth every six (6) hours as needed for pain. (Patient not taking: Reported on 07/04/2017) 12 tablet 0   ??? insulin ASPART (NOVOLOG FLEXPEN U-100 INSULIN) 100 unit/mL injection pen Inject 0.06 mL (6 Units total) under the skin Three (3) times a day before meals. (Patient taking differently: Inject 6 Units under the skin Three (3) times a day before meals. PLUS CORRECTIONS (Additional mealtime per SSI-  151-200= 2 units,  201-250= 4 units, 251-300=add 6 units, 301-350=8 units, 351-400=10 units, >400 =12 units)) 4 mL 11   ??? insulin ASPART (NOVOLOG FLEXPEN) 100 unit/mL injection pen Additional mealtime per SSI-  151-200= 2 units, 201-250= 4 units, 251-300=add 6 units, 301-350=8 units, 351-400=10 units, >400 =12 units (Patient taking differently: No sig reported) 1.8 mL 0   ??? insulin glargine (LANTUS) 100 unit/mL (3 mL) injection pen Inject 0.12 mL (12 Units total) under the skin nightly. 4 mL 11   ??? ivermectin (STROMECTOL) 3 mg Tab Take 4 tablets each day on the following days:  May 1, May 2, May 14, and May 15. 16 tablet 0   ??? lancets Misc 1 each by Miscellaneous route Four (4) times a day (before meals and nightly). 100 each 11   ??? magnesium oxide-Mg AA chelate (MAGNESIUM, AMINO ACID CHELATE,) 133 mg Tab Take 2 tablets by mouth Two (2) times a day. 100 tablet PRN   ??? melatonin 3 mg Tab Take 1 tablet (3 mg total) by mouth nightly. 30 tablet 0   ??? mirtazapine (REMERON) 15 MG tablet Take 1 tablet (15 mg total) by mouth nightly. (  Patient not taking: Reported on 07/04/2017) 90 tablet 3   ??? mycophenolate (CELLCEPT) 250 mg capsule Take 2 capsules (500 mg total) by mouth Two (2) times a day. 360 capsule 3   ??? NEORAL 100 mg capsule 175 mg BID (three 25 mg capsules and one 100 mg capsule two times a day) 60 capsule 11   ??? NEORAL 25 mg capsule 175 mg BID (three 25 mg capsules and one 100 mg capsule two times a day) 180 capsule 11   ??? omeprazole (PRILOSEC) 20 MG capsule Take 1 capsule (20 mg total) by mouth daily. 30 capsule 1   ??? pen needle, diabetic (ULTICARE PEN NEEDLE) 32 gauge x 5/32 Ndle 1 each by Miscellaneous route Four (4) times a day (before meals and nightly). 90 each 3   ??? sildenafil, antihypertensive, (REVATIO) 20 mg tablet Take 40 mg by mouth Three (3) times a day.     ??? traMADol (ULTRAM) 50 mg tablet Take 1 tablet (50 mg total) by mouth every six (6) hours as needed for pain. 30 tablet 0   ??? warfarin (COUMADIN) 1 MG tablet Take as directed 90 tablet 0   ??? warfarin (COUMADIN) 3 MG tablet Take 3 mg by mouth nightly.        No current facility-administered medications for this visit.         Physical Exam:    weight is 58.9 kg (129 lb 14.4 oz). His oral temperature is 36.9 ??C (98.4 ??F). His blood pressure is 117/68 and his pulse is 80. His oxygen saturation is 99%.   General Appearance:    Alert, cooperative, no distress, appears stated age   Head:    Normocephalic, without obvious abnormality, atraumatic   Eyes:    PERRL, conjunctiva clear, EOM's intact    Oropharynx:   No lesions   Neck:   Supple, trachea midline, no adenopathy;    No JVD   Lungs:     Good breath sounds b/l, no crackles or wheezes, respirations unlabored   Chest Wall:    No tenderness or deformity    Heart:    Regular rate and rhythm, S1 and S2 normal, no murmur, rub   or gallop   Abdomen:     Soft, non-tender, normal bowel sounds, no masses, no organomegaly.  + G tube in place   Extremities:   Extremities normal, no cyanosis, clubbing, or edema   Pulses:   2+ and symmetric all extremities   Skin:   No rashes or lesions   Lymph nodes:   No cervical or supraclavicular adenopathy   Neurologic:   No focal neurological deficits       Diagnostic Review:    Echo 04/21/17:    Left Ventricle The left ventricle is normal in size with normal wall thickness.   The left ventricular systolic function is normal.   The ejection fraction is visually estimated at 65 to 70%.   There is impaired left ventricular relaxation with normal left ventricular filling pressure consistent with grade I diastolic dysfunction.   Mitral Valve The mitral valve leaflets are normal, with normal leaflet mobility.   There is no significant mitral regurgitation by color and continuous wave Doppler imaging.   Left Atrium The left atrium is dilated.   Aortic Valve The aortic valve is poorly visualized with probably normal excursion.   There is no significant aortic regurgitation by color and continuous wave Doppler imaging.  There is no evidence of a significant transvalvular gradient.   -  Peak transvalvular velocity: 1 m/sec   Ascending Aorta The aorta is normal in size in the visualized segments.   Pulmonary Artery The pulmonary artery is not well visualized.   Pulmonic Valve The pulmonic valve is poorly visualized, but probably normal.   There is no significant pulmonic regurgitation by color and continuous wave Doppler imaging.   There is no evidence of a significant transvalvular gradient.   - Peak transvalvular velocity: 0.9 m/sec   Right Ventricle The right ventricle is normal in size with normal wall thickness.   The right ventricular systolic function is normal.   Tricuspid Valve The tricuspid valve leaflets are normal, with normal leaflet mobility.   There is trivial tricuspid regurgitation by color and continuous wave Doppler imaging.   - The Doppler signal is suboptimal and pulmonary artery systolic pressure cannot be accurately estimated.   Right Atrium The right atrium is normal in size.   IVC/SVC The IVC diameter is <=21 mm with >50% decrease in size with inspiration suggesting normal right atrial pressure (0-5 mm Hg).   Pericardium There is no evidence of a significant pericardial effusion.         V/Q scan 01/11/17:    Impression     Normal ventilation and perfusion scan.         CT scan PE protocol performed on 09/2016 post op revealed:    Impression     No CT evidence for acute pulmonary embolism, as clinically questioned.    Moderate bilateral pleural effusions similar to prior CT dated 01/30/2016 with slightly increased atelectasis of the adjacent lung parenchyma in the bilateral lower lobes, left greater than right.    Addendum (Dr.Altun dictating 09/27/2016 08:35 AM) Focal contour bulging of the liver adjacent to the diaphragm and right kidney is noted which is not well evaluated due to the presence of streak artifacts.          Pulmonary Function Test performed on 11/24/15 show:            Consistent with normal spirometry and normal DLCO    6 Minute Walk Test performed on 01/06/17:         Echo performed on 09/27/16:    Echocardiographic Findings     Left Ventricle The left ventricle is normal in size with normal wall thickness.   The left ventricular systolic function is normal.   The ejection fraction is visually estimated at 60 to 65%.   Left ventricular diastolic function cannot be accurately assessed.      Left Atrium The left atrium is normal in size.      Right Ventricle The right ventricle is moderately dilated.   The right ventricular systolic function is decreased.      Tricuspid Valve The tricuspid valve is not well visualized.   There is mild tricuspid regurgitation by color and continuous wave Doppler imaging.   The regurgitation jet is directed toward the septum.   The pulmonary artery systolic pressure is severely elevated.   - Peak tricuspid regurgitant velocity: 4.6 m/sec  - Estimated pulmonary artery systolic pressure: 80 + RA pressure mmHg      Right Atrium The right atrium is poorly visualized but appears dilated      IVC/SVC Mechanical ventilation precludes the ability to accurately assess right atrial pressure.      Pericardium There is a small anterior pericardial effusion.          [facility] ICU Catheterization performed on date 09/22/16:  Wedge pressure initially looks to be 15-16 but with further balloon deflation this clearly decreases further to 8 mmHg with very good quality tracing. At that time mPAP 50, CVP 9, CO by CCO measurements 4.1/CI 2.2, PVR approximating 10 WU.

## 2017-07-21 NOTE — Unmapped (Signed)
Thank you for visiting the [facility] Pulmonary Hypertension Clinic.    If you have any questions or concerns, please call     H. Sherre Lain, MD    Marva Panda, RN  Claretha Cooper, RN    Pulmonary Hypertension nurse coordinators:    [phone].

## 2017-07-21 NOTE — Unmapped (Signed)
Per PharmD Mincemoyer since patient drug levels are so variable no adjustments needed at this time.

## 2017-07-24 ENCOUNTER — Institutional Professional Consult (permissible substitution): Admit: 2017-07-24 | Discharge: 2017-07-25 | Payer: MEDICARE

## 2017-07-24 DIAGNOSIS — I455 Other specified heart block: Principal | ICD-10-CM

## 2017-07-24 NOTE — Unmapped (Signed)
REMOTE PACEMAKER CHECK (EVERY 90 DAYS)  Date/Time: 07/24/2017 12:50 PM  Performed by: Skipper Cliche, RN  Authorized by: Joretta Bachelor, MD       DEVICE AND LEAD INFORMATION            PRESENTING EGM:  VS  Rate: 72  Patient Dependent:  NO    DEVICE DETECTED EVENTS    NONE    Heart Failure:  N/A        Anticoagulation: Warfarin                 IMPRESSION:   Normal device function  Tested Lead measurements stable and within normal range  No significant new atrial or ventricular arrhythmias    RECOMMENDATIONS  -Continue home monitoring as ordered with yearly in person checks    See PDF in Media file for full interrogation

## 2017-08-01 MED FILL — DIGOXIN/0.125MG/TABS: DIGOXIN/0.125MG/TABS | 30 days supply | Qty: 15 | Fill #7

## 2017-08-01 MED FILL — SILDENAFIL CITRATE/20MG/TABS: SILDENAFIL CITRATE/20MG/TABS | 30 days supply | Qty: 180 | Fill #3

## 2017-08-01 MED FILL — MG PLUS PROTEIN 133 MG/133 MG/TABS: MG PLUS PROTEIN 133 MG/133 MG/TABS | 50 days supply | Qty: 100 | Fill #2

## 2017-08-08 ENCOUNTER — Ambulatory Visit
Admit: 2017-08-08 | Discharge: 2017-08-09 | Payer: MEDICARE | Attending: Student in an Organized Health Care Education/Training Program | Primary: Student in an Organized Health Care Education/Training Program

## 2017-08-08 DIAGNOSIS — R05 Cough: Principal | ICD-10-CM

## 2017-08-08 DIAGNOSIS — J3801 Paralysis of vocal cords and larynx, unilateral: Secondary | ICD-10-CM

## 2017-08-08 DIAGNOSIS — R490 Dysphonia: Secondary | ICD-10-CM

## 2017-08-08 DIAGNOSIS — R1319 Other dysphagia: Secondary | ICD-10-CM

## 2017-08-08 NOTE — Unmapped (Signed)
Otolaryngology     Reason for visit:  Follow up     History of Present Illness:     Recall, initial presentation on 01/30/17 [name] is a 66 y.o. 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 He spent 3 months in the hospital following his transplant he had respiratory compromise and ultimately had tracheostomy tube for much of that stay. He was decannulated in September before discharge home. 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  Patient reports he lost his voice following liver transplant episode 3 months ago. He states that since surgery his voice is breathy and weak. He cannot control his pitch. His voice symptoms do not fluctuate throughout the day and he has not noticed anything that makes it better or worse. He does have a cough throughout the day. He also endorses globus sensation. He denies dysphagia. He is maintained on an oral diet but has not had his G-tube removed yet due to surgeon preference. He has never had voice issues before and has never seen an ENT doctor for any related issues. He does endorse consistent shortness of breath with exertion and notes that this is likely secondary to his recovery process from his liver transplant. She does take Prilosec for his GERD. Thinks that this is stable. No coughing or choking while eating.    He was noted to have a right vocal fold paralysis with significant breathy voice and glottal insufficiency.   For multilpe reasons intervention was delayed.   He has had quite a complicated course.     He did have anemia and hemothorax.     He underwent in office injection cymetra on 05/31/17 0.56ml right fold.     Since then, he has had multiple set backs in his GI care. He had a fall and possible liver abscess. He underwent intubation and ERCP in April. He has been admitted to the hospital twice.     08/08/17: He returns today in follow-up.  He reports no significant improvement in the voice following injection.  He continues to be weak and breathy.  He is also had increasing coughing on foods and liquids.  No pneumonia.  He has had significant illnesses since his injection however as stated above.      Past Medical History:  Past Medical History:   Diagnosis Date   ??? Atrial fibrillation (CMS-HCC)    ??? Basal cell carcinoma    ??? Cancer (CMS-HCC)    ??? Cirrhosis (CMS-HCC)    ??? Diabetes (CMS-HCC)    ??? Hepatitis C 07/17/2012   ??? Liver disease    ??? Low back pain 07/17/2012   ??? Varices, esophageal (CMS-HCC)        Past Surgical History:  Past Surgical History:   Procedure Laterality Date   ??? ANKLE SURGERY     ??? BACK SURGERY     ??? BACK SURGERY     ??? CARDIAC SURGERY      pacemaker   ??? CHG US GUIDE, TISSUE ABLATION N/A 01/22/2016    Procedure: ULTRASOUND GUIDANCE FOR, AND MONITORING OF, PARENCHYMAL TISSUE ABLATION;  Surgeon: Particia Nearing, MD;  Location: MAIN OR [facility];  Service: Transplant   ??? IR EMBOLIZATION ORGAN ISCHEMIA, TUMORS, INFAR  06/16/2016    IR EMBOLIZATION ORGAN ISCHEMIA, TUMORS,  [facility] 06/16/2016 Ammie Dalton, MD IMG VIR [facility]   ??? PR COLON CA SCRN NOT HI RSK IND N/A 02/27/2015    Procedure: COLOREC CNCR SCR;COLNSCPY NO;  Surgeon: Vonda Antigua, MD;  Location: GI PROCEDURES MEMORIAL [facility];  Service: Gastroenterology   ??? PR ENDOSCOPIC ULTRASOUND EXAM N/A 02/27/2015    Procedure: UGI ENDO; W/ENDO ULTRASOUND EXAM INCLUDES ESOPHAGUS, STOMACH, &/OR DUODENUM/JEJUNUM;  Surgeon: Vonda Antigua, MD;  Location: GI PROCEDURES MEMORIAL [facility];  Service: Gastroenterology   ??? PR ERCP BALLOON DILATE BILIARY/PANC DUCT/AMPULLA EA N/A 02/10/2017    Procedure: ERCP;WITH TRANS-ENDOSCOPIC BALLOON DILATION OF BILIARY/PANCREATIC DUCT(S) OR OF AMPULLA, INCLUDING SPHINCTERECTOMY, WHEN PERFOREMD,EACH DUCT (81191);  Surgeon: Mayford Knife, MD;  Location: GI PROCEDURES MEMORIAL [facility];  Service: Gastroenterology   ??? PR ERCP REMOVE FOREIGN BODY/STENT BILIARY/PANC DUCT N/A 02/10/2017    Procedure: ENDOSCOPIC RETROGRADE CHOLANGIOPANCREATOGRAPHY (ERCP); W/ REMOVAL OF FOREIGN BODY/STENT FROM BILIARY/PANCREATIC DUCT(S);  Surgeon: Mayford Knife, MD;  Location: GI PROCEDURES MEMOR[facility];  Service: Gastroenterology   ??? PR ERCP REMOVE FOREIGN BODY/STENT BILIARY/PANC DUCT N/A 06/29/2017    Procedure: ENDOSCOPIC RETROGRADE CHOLANGIOPANCREATOGRAPHY (ERCP); W/ REMOVAL OF FOREIGN BODY/STENT FROM BILIARY/PANCREATIC DUCT(S);  Surgeon: Vonda Antigua, MD;  Location: GI PROCEDURES [facility]OR[facility];  Service: Gastroenterology   ??? PR ERCP STENT PLACEMENT BILIARY/PANCREATIC DUCT N/A 11/29/2016    Procedure: ENDOSCOPIC RETROGRADE CHOLANGIOPANCREATOGRAPHY (ERCP); WITH PLACEMENT OF ENDOSCOPIC STENT INTO BILIARY OR PANCREATIC DUCT;  Surgeon: Chriss Driver, MD;  Location: GI PROCEDURES [facility];  Service: Gastroenterology   ??? PR ERCP STENT PLACEMENT BILIARY/PANCREATIC DUCT N/A 04/26/2017    Procedure: ENDOSCOPIC RETROGRADE CHOLANGIOPANCREATOGRAPHY (ERCP); WITH PLACEMENT OF ENDOSCOPIC STENT INTO BILIARY OR PANCREATIC DUCT;  Surgeon: Mayford Knife, MD;  Location: GI PROCEDURES MEMOR[facility];  Service: Gastroenterology   ??? PR ERCP,W/REMOVAL STONE,BIL/PANCR DUCTS N/A 11/29/2016    Procedure: ERCP; W/ENDOSCOPIC RETROGRADE REMOVAL OF CALCULUS/CALCULI FROM BILIARY &/OR PANCREATIC DUCTS;  Surgeon: Chriss Driver, MD;  Location: GI PROCEDURES MEMOR[facility];  Service: Gastroenterology   ??? PR ERCP,W/REMOVAL STONE,BIL/PANCR DUCTS N/A 02/10/2017    Procedure: ERCP; W/ENDOSCOPIC RETROGRADE REMOVAL OF CALCULUS/CALCULI FROM BILIARY &/OR PANCREATIC DUCTS;  Surgeon: Mayford Knife, MD;  Location: GI PROCEDURES [facility];  Service: Gastroenterology   ??? PR ERCP,W/REMOVAL STONE,BIL/PANCR DUCTS N/A 04/26/2017    Procedure: ERCP; W/ENDOSCOPIC RETROGRADE REMOVAL OF CALCULUS/CALCULI FROM BILIARY &/OR PANCREATIC DUCTS;  Surgeon: Mayford Knife, MD;  Location: GI PROCEDURES [facility];  Service: Gastroenterology   ??? PR ERCP,W/REMOVAL STONE,BIL/PANCR DUCTS N/A 06/29/2017    Procedure: ERCP; W/ENDOSCOPIC RETROGRADE REMOVAL OF CALCULUS/CALCULI FROM BILIARY &/OR PANCREATIC DUCTS;  Surgeon: Vonda Antigua, MD;  Location: GI PROCEDURES MEMOR[facility];  Service: Gastroenterology   ??? PR INSER HEART TEMP PACER ONE CHMBR N/A 10/02/2016    Procedure: Tempoarary Pacemaker Insertion;  Surgeon: Meredith Leeds, MD;  Location: [facility];  Service: Cardiology   ??? PR LAP,DIAGNOSTIC ABDOMEN N/A 01/22/2016    Procedure: Laparoscopy, Abdomen, Peritoneum, & Omentum, Diagnostic, W/Wo Collection Specimen(S) By Brushing Or Washing;  Surgeon: Particia Nearing, MD;  Location: MAIN OR [facility];  Service: Transplant   ??? PR PLACE PERCUT GASTROSTOMY TUBE N/A 11/17/2016    Procedure: UGI ENDO; W/DIRECTED PLCMT PERQ GASTROSTOMY TUBE;  Surgeon: Cletis Athens, MD;  Location: GI PROCEDURES MEM[facility];  Service: Gastroenterology   ??? PR TRACHEOSTOMY, PLANNED N/A 09/29/2016    Procedure: TRACHEOSTOMY PLANNED (SEPART PROC);  Surgeon: Katherina Mires, MD;  Location: [facility] OR [facility];  Service: Trauma   ???  PR TRANSCATH INSERT OR REPLACE LEADLESS PM VENTR N/A 10/11/2016    Procedure: Pacemaker Implant/Replace Leadless;  Surgeon: Meredith Leeds, MD;  Location: [facility];  Service: Cardiology   ??? PR TRANSPLANT LIVER,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    Procedure: Liver Allotransplantation; Orthotopic, Partial Or Whole, From Cadaver Or Living Donor, Any Age;  Surgeon: Doyce Loose, MD;  Location: MAIN OR [facility];  Service: Transplant   ??? PR TRANSPLANT,PREP DONOR LIVER, WHOLE N/A 09/15/2016    Procedure: Rogelia Boga Std Prep Cad Donor Whole Liver Gft Prior Tnsplnt,Inc Chole,Diss/Rem Surr Tissu Wo Triseg/Lobe Splt;  Surgeon: Doyce Loose, MD;  Location: MAIN OR [facility];  Service: Transplant   ??? PR UPPER GI ENDOSCOPY,BIOPSY N/A 07/17/2012    Procedure: UGI ENDOSCOPY; WITH BIOPSY, SINGLE OR MULTIPLE;  Surgeon: Alba Destine, MD;  Location: GI PROCEDURES MEMOR[facility];  Service: Gastroenterology   ??? PR UPPER GI ENDOSCOPY,DIAGNOSIS N/A 02/04/2014    Procedure: UGI ENDO, INCLUDE ESOPHAGUS, STOMACH, & DUODENUM &/OR JEJUNUM; DX W/WO COLLECTION SPECIMN, BY BRUSH OR WASH;  Surgeon: Wilburt Finlay, MD;  Location: GI PROCEDURES [facility]OR[facility];  Service: Gastroenterology   ??? PR UPPER GI ENDOSCOPY,LIGAT VARIX N/A 11/05/2013    Procedure: UGI ENDO; W/BAND LIG ESOPH &/OR GASTRIC VARICES;  Surgeon: Wilburt Finlay, MD;  Location: GI PROCEDURES MEMORIAL [facility];  Service: Gastroenterology       Medications:    Current Outpatient Medications:   ???  acetaminophen (TYLENOL) 325 MG tablet, Take 1 tablet (325 mg total) by mouth every four (4) hours as needed for pain., Disp: 100 tablet, Rfl: 2  ???  blood sugar diagnostic (FREESTYLE TEST) Strp, by Other route Four (4) times a day (before meals and nightly)., Disp: 100 each, Rfl: 11  ???  blood-glucose meter (GLUCOSE MONITORING KIT) kit, Use as instructed, Disp: 1 each, Rfl: 0  ???  cholecalciferol, vitamin D3, 2,000 unit cap, 1 capsule (2,000 Units total) by PEG Tube route daily. (Patient taking differently: Take 1 capsule by mouth daily.), Disp: 30 capsule, Rfl: 11  ???  digoxin (LANOXIN) 125 mcg tablet, 0.5 tablets (62.5 mcg total) by G-tube route daily. (Patient taking differently: Take 0.5 tablets by mouth daily.), Disp: 15 tablet, Rfl: 11  ???  insulin ASPART (NOVOLOG FLEXPEN U-100 INSULIN) 100 unit/mL injection pen, Inject 0.06 mL (6 Units total) under the skin Three (3) times a day before meals. (Patient taking differently: Inject 6 Units under the skin Three (3) times a day before meals. PLUS CORRECTIONS (Additional mealtime per SSI-  151-200= 2 units,  201-250= 4 units, 251-300=add 6 units, 301-350=8 units, 351-400=10 units, >400 =12 units)), Disp: 4 mL, Rfl: 11  ???  insulin ASPART (NOVOLOG FLEXPEN) 100 unit/mL injection pen, Additional mealtime per SSI-  151-200= 2 units,  201-250= 4 units, 251-300=add 6 units, 301-350=8 units, 351-400=10 units, >400 =12 units (Patient taking differently: No sig reported), Disp: 1.8 mL, Rfl: 0  ???  insulin glargine (LANTUS) 100 unit/mL (3 mL) injection pen, Inject 0.12 mL (12 Units total) under the skin nightly., Disp: 4 mL, Rfl: 11  ???  lancets Misc, 1 each by Miscellaneous route Four (4) times a day (before meals and nightly)., Disp: 100 each, Rfl: 11  ???  magnesium oxide-Mg AA chelate (MAGNESIUM, AMINO ACID CHELATE,) 133 mg Tab, Take 2 tablets by mouth Two (2) times a day., Disp: 100 tablet, Rfl: PRN  ???  melatonin 3 mg Tab, Take 1 tablet (3 mg total) by mouth nightly., Disp: 30 tablet, Rfl: 0  ???  mirtazapine (REMERON) 15 MG tablet, Take 1 tablet (15 mg total) by mouth nightly., Disp: 90 tablet, Rfl: 3  ???  mycophenolate (CELLCEPT) 250 mg capsule, Take 2 capsules (500 mg total) by mouth Two (2) times a day., Disp: 360 capsule, Rfl: 3  ???  NEORAL 100 mg capsule, 175 mg BID (three 25 mg capsules and one 100 mg capsule two times a day), Disp: 60 capsule, Rfl: 11  ???  NEORAL 25 mg capsule, 175 mg BID (three 25 mg capsules and one 100 mg capsule two times a day), Disp: 180 capsule, Rfl: 11  ???  omeprazole (PRILOSEC) 20 MG capsule, Take 1 capsule (20 mg total) by mouth daily., Disp: 30 capsule, Rfl: 1  ???  pen needle, diabetic (ULTICARE PEN NEEDLE) 32 gauge x 5/32 Ndle, 1 each by Miscellaneous route Four (4) times a day (before meals and nightly)., Disp: 90 each, Rfl: 3  ???  sildenafil, antihypertensive, (REVATIO) 20 mg tablet, Take 40 mg by mouth Three (3) times a day., Disp: , Rfl:   ???  traMADol (ULTRAM) 50 mg tablet, Take 1 tablet (50 mg total) by mouth every six (6) hours as needed for pain., Disp: 30 tablet, Rfl: 0  ???  warfarin (COUMADIN) 3 MG tablet, Take 3 mg by mouth nightly. , Disp: , Rfl:   ???  doxycycline (MONODOX) 100 MG capsule, Take 1 tablet (100 mg) BID x 10 days with food (Patient not taking: Reported on 08/08/2017), Disp: 20 capsule, Rfl: 0  ???  HYDROcodone-acetaminophen (NORCO) 5-325 mg per tablet, Take 1 tablet by mouth every six (6) hours as needed for pain. (Patient not taking: Reported on 07/04/2017), Disp: 12 tablet, Rfl: 0  ???  ivermectin (STROMECTOL) 3 mg Tab, Take 4 tablets each day on the following days:  May 1, May 2, May 14, and May 15. (Patient not taking: Reported on 08/08/2017), Disp: 16 tablet, Rfl: 0  ???  warfarin (COUMADIN) 1 MG tablet, Take as directed (Patient not taking: Reported on 08/08/2017), Disp: 90 tablet, Rfl: 0     Allergies:  No Known Allergies    Family History:  Family History   Problem Relation Age of Onset   ??? Hypertension Mother    ??? Cirrhosis Neg Hx    ??? Liver cancer Neg Hx    ??? Anemia Neg Hx    ??? Cancer Neg Hx    ??? Diabetes Neg Hx    ??? Kidney disease Neg Hx    ??? Obesity Neg Hx    ??? Thyroid disease Neg Hx    ??? Osteoporosis Neg Hx    ??? Coronary artery disease Neg Hx    ??? Anesthesia problems Neg Hx    ??? Basal cell carcinoma Neg Hx    ??? Squamous cell carcinoma Neg Hx      Social History:  Social History     Tobacco Use   ??? Smoking status: Never Smoker   ??? Smokeless tobacco: Never Used   ??? Tobacco comment: Smoked in high school for about 5 years.    Substance Use Topics   ??? Alcohol use: No   ??? Drug use: No       Review of Systems:  Denies chest pain, sob, fevers, n/v, abd pain      Physical Exam:   Constitutional:  Vitals reviewed in chart, patient has normal appearance. Well nourished, well-developed, no acute distress  Voice:  Hoarse, breathy, weak voice  Respiration:  Breathing comfortably, no stridor.  CV:  No clubbing/cyanosis/edema in hands.   Eyes:  extraocular motion intact, sclera normal.   Neuro:  Alert and oriented times 3, Cranial nerves 2-12 intact and symmetric bilaterally.   Head and Face:  Skin with no masses or lesions, sinuses nontender to palpation, facial nerve fully intact.   Salivary Glands:  Parotid and submandibular glands normal bilaterally. Ears:  Normal external ears  Nose:  External nose midline, anterior rhinoscopy is normal with limited visualization just to the anterior interior turbinate.   Oral Cavity/Oropharynx/Lips:  Normal mucous membranes, normal floor of mouth/tongue/oropharynx, no masses or lesions are noted.  Good dentition  Pharyngeal Walls:  No masses noted.  Neck/Lymph:  No lymphadenopathy, no thyroid masses. Previous tracheostoma still patent with moderate granulation tissue filling the fistula. No erythema or drainage or purulence noted.   Larynx: Mirror evaluation insufficient to visualize secondary to prominent gag        Procedure Note    Endoscopy Type:  Flexible Fiberoptic Videostroboscopy    Indications/TimeOut:  To better evaluate the patient???s symptoms, fiberoptic videostroboscopy is indicated.   A time out identifying the patient, the procedure, the location of the procedure and any concerns was performed prior to beginning the procedure.    Procedure Details:    The patient was placed in the sitting position.  After topical anesthesia and decongestion with oxymetazoline and 1% lidocaine, the flexible laryngoscope was passed.  The microphone was used to trigger the xenon stroboscopic light source.    -  Nasal cavity  - There was no pus or polyps noted in the nasal cavity. Septum intact  -  Nasopharynx - there was no evidence of mass or lesion.  -  Hypopharynx - There were no lesions in the pyriformis, epiglottis, or base of tongue.  -  Larynx -  there was mild interarytenoid edema, no erythema.  -  Vocal Folds -     Mobility: Right VF immobility   Amplitude: Unable to interpret secondary to Glottic gap   Mucosal Wave: unable to assess secondary to glottic incompetence   Closure: Transglottic gap   Lesions/Other findings: Right vocal fold atrophy    Condition:  Stable.  Patient tolerated procedure well.    Complications:  None    VRQOL: 0  GFI: 19    Assessment:   [name] is a 67 y.o. 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  He  has a past medical history of Atrial fibrillation (CMS-HCC), Basal cell carcinoma, Cancer (CMS-HCC), Cirrhosis (CMS-HCC), Diabetes (CMS-HCC), Hepatitis C (07/17/2012), Liver disease, Low back pain (07/17/2012), and Varices, esophageal (CMS-HCC).    Findings:   dypshonia  right vocal fold immobility   Glottic insufficiency   --all temporally related to liver transplant transplant. (prolonged intubation, trach possible etiologies)   05/31/17 Status post in office injection augmentation with Cymetra in the [facility]PR   Significantly complicated history surrounding injection including hemothorax and anemia prior to injection followed by readmission x2 for fall rib pain and intubation for ERCP.    Plan:  He did not have a good result with his injection augmentation in the office.  I do think this was a combination of poor timing from an illness standpoint and functional dysphonia.  He has had significant setbacks with 3 admissions surrounding the injection.  This also delayed his postop visit today.  Given his severe dysphonia and large glottal gap I would  like to repeat his trial injection augmentation procedure.  Given his significant illnesses I would again like to do this in the office.  This time we will do this at the time when he is more well.  We will also have a very early postoperative visit as well as immediate initiation of voice therapy.  Would like to ensure that the voice does improve prior to continuing with permanent implant.    We will do this on June 3 in the [facility]  He may  continue his anticoagulation.    Also given his increase in cough surrounding eating we will perform an MBS prior to his injections to ensure safe swallow

## 2017-08-08 NOTE — Unmapped (Signed)
Vocal Fold Augmentation Procedure (In-Office)    What is it?   This is designed to deliver a temporary filler material into one or both vocal folds to allow for improved closure of the vocal folds.  This is aimed at improving the voice, cough, and/or swallowing.     What is it used to treat?   Vocal fold paralysis  Vocal fold paresis  Vocal fold scar  Vocal atrophy    What can you expect?   A nasal decongestant and local anesthesia is applied to the nose.  Local anesthesia is applied to the skin.  Additional local anesthesia is applied directly to the larynx (voice box).  This often causes some coughing aiding in the anesthesia.  Anti-anxiety medication is rarely required. The surgeon???s assistant passes the endoscope through the nose for visualization of the vocal folds.  The surgeon then passes a needle into the vocal folds and administers the appropriate amount of material. Some people may experience some discomfort, pressure, ear pain, and/or coughing.  The entire visit is typically 30-45 minutes.  You may return to work or your normal daily activities after the procedure.  You may eat or drink one-hour post procedure.  The length of desired effect is dependent on the material used.     What are the risks?   Bleeding, bruising, airway swelling, difficulty breathing, discomfort, vasovagal event, hoarseness, changes in heart rate, blood pressure, anxiety, need for repeat procedure.    What are the advantages?   Avoidance of general anesthesia, decreased time spent, faster recovery, low rate of complications.     How do I prepare?   We recommend that you refrain from eating or drinking 2 hours prior to the procedure.  Take all of your normal medications unless specifically instructed to stop by your Doctor. Please discuss with your surgeon if you are taking aspirin, clopidogrel (Plavix), coumadin (Warfarin), dabigatran (Pradaxa), rivaroxaban (Xarelto), enoxaparin (Lovenox), apixaban (Eliquis) or vitamin E, Ginko biloba, and Fish oil. Please arrive prior to your scheduled time.     Where is it performed?   [facility]  [facility]  [address]  [place], [place]  [address]

## 2017-08-11 NOTE — Unmapped (Signed)
[facility] Refill Coordination Note  Specialty Medication(s): NEORAL 25 AND 100, MYCOPHENOLATE 250  Additional Medications shipped: Milana Obey- 5RX TOTAL    [name], DOB: 1950/05/10  Phone: [phone] (home) , Alternate phone contact: N/A  Phone or address changes today?: No  All above HIPAA information was verified with patient.  Shipping Address:[name] [place], [place] [address]  Insurance changes? No    Completed refill call assessment today to schedule patient's medication shipment from the [facility] [phone]).      Confirmed the medication and dosage are correct and have not changed: Yes, regimen is correct and unchanged.    Confirmed patient started or stopped the following medications in the past month:  No, there are no changes reported at this time.    Are you tolerating your medication?:  Brigido reports tolerating the medication.    ADHERENCE  Neoral 100 mg   Quantity filled last month: 60   # of tablets left on hand: 10 DAYS LEFT  Neoral 25 mg   Quantity filled last month: 180   # of tablets left on hand: 10 DAYS LEFT  Mycophenolate Mofetil 250 mg   Quantity filled last month: 180   # of tablets left on hand: 10 DAYS LEFT    Did you miss any doses in the past 4 weeks? No missed doses reported.    FINANCIAL/SHIPPING    Delivery Scheduled: Yes, Expected medication delivery date: 08/17/17 VIA UPS     The patient will receive an FSI print out for each medication shipped and additional FDA Medication Guides as required.  Patient education from [place] or Robet Leu may also be included in the shipment    Lorik did not have any additional questions at this time.    Delivery address validated in FSI scheduling system: Yes, address listed in FSI is correct.    We will follow up with patient monthly for standard refill processing and delivery.      Thank you,  Thad Ranger   [facility] Specialty Pharmacist

## 2017-08-16 MED FILL — NEORAL/25MG/CAP: NEORAL/25MG/CAP | 30 days supply | Qty: 180 | Fill #2

## 2017-08-16 MED FILL — MYCOPHENOLATE MOFETIL/250MG/CAPS: MYCOPHENOLATE MOFETIL/250MG/CAPS | 30 days supply | Qty: 120 | Fill #5

## 2017-08-16 MED FILL — WARFARIN/3MG/TAB: WARFARIN/3MG/TAB | 30 days supply | Qty: 30 | Fill #5

## 2017-08-16 MED FILL — MIRTAZAPINE/15MG/TABS: MIRTAZAPINE/15MG/TABS | 30 days supply | Qty: 30 | Fill #0

## 2017-08-16 MED FILL — NEORAL/100MG/CAP: NEORAL/100MG/CAP | 30 days supply | Qty: 60 | Fill #2

## 2017-08-18 MED FILL — UNIFINE PENTIPS 32GX4MM/32GX4MM/MISC: UNIFINE PENTIPS 32GX4MM/32GX4MM/MISC | 25 days supply | Qty: 100 | Fill #1

## 2017-08-18 MED FILL — ON CALL EXPRESS BLOOD GLU/EXPRESS/STRP: ON CALL EXPRESS BLOOD GLU/EXPRESS/STRP | 25 days supply | Qty: 100 | Fill #1

## 2017-08-29 ENCOUNTER — Ambulatory Visit: Admit: 2017-08-29 | Discharge: 2017-08-29 | Payer: MEDICARE

## 2017-08-29 ENCOUNTER — Ambulatory Visit
Admit: 2017-08-29 | Discharge: 2017-08-29 | Payer: MEDICARE | Attending: Speech-Language Pathologist | Primary: Speech-Language Pathologist

## 2017-08-29 DIAGNOSIS — R05 Cough: Secondary | ICD-10-CM

## 2017-08-29 DIAGNOSIS — R490 Dysphonia: Secondary | ICD-10-CM

## 2017-08-29 DIAGNOSIS — R1319 Other dysphagia: Principal | ICD-10-CM

## 2017-08-29 NOTE — Unmapped (Signed)
[facility] ADULT SPEECH THERAPY [place]  OUTPATIENT SPEECH PATHOLOGY  08/29/2017      Patient Name: Jeffrey Ward  Date of Birth:11/12/1950     Diagnosis: r/o dysphagia    Date of Evaluation: 08/29/17     Referred by: R. Sherryll Burger, MD             Assessment: Pt presents with a mild pharyngeal dysphagia c/b reduced BOT retraction which led to mild vallecula residue (all consistencies). Trace post-cricoid residue of solids. Trace penetration x 1 of thin liquids before swallows (cleared). No aspiration observed. Cervical esophagus: mild accumulation of boluses at tissue protrusion (~C6) c/w the appearance of a small Zenker's diverticulum. Occasional mild backflow of boluses from this area.     PO Recommendations : Regular Solids (no restrictions), Thin liquids    Recommended Compensatory Techniques : Small sips/bites, Alternate liquids and solids, Upright 90 degrees                       PLAN: ENT f/u           Prognosis:  Good                   Goals:  Patient and Family Goals: not stated                                        SUBJECTIVE:  67 y.o. 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 He is s/p cymetra injection 05/31/17 and is scheduled for a repeat injection augmentation 09/05/17. He is on a regular diet with thin liquids. He c/o occasional cough with liquids, solids and saliva.    Communication Preference: Verbal    Barriers to Learning: No Barriers                        Pain?: No               Past Medical History:   Diagnosis Date   ??? Atrial fibrillation (CMS-HCC)    ??? Basal cell carcinoma    ??? Cancer (CMS-HCC)    ??? Cirrhosis (CMS-HCC)    ??? Diabetes (CMS-HCC)    ??? Hepatitis C 07/17/2012   ??? Liver disease    ??? Low back pain 07/17/2012   ??? Varices, esophageal (CMS-HCC)       Family History   Problem Relation Age of Onset   ??? Hypertension Mother    ??? Cirrhosis Neg Hx    ??? Liver cancer Neg Hx    ??? Anemia Neg Hx    ??? Cancer Neg Hx    ??? Diabetes Neg Hx    ??? Kidney disease Neg Hx    ??? Obesity Neg Hx    ??? Thyroid disease Neg Hx    ??? Osteoporosis Neg Hx    ??? Coronary artery disease Neg Hx    ??? Anesthesia problems Neg Hx    ??? Basal cell carcinoma Neg Hx    ??? Squamous cell carcinoma Neg Hx      Past Surgical History:   Procedure Laterality Date   ??? ANKLE SURGERY     ??? BACK SURGERY     ??? BACK SURGERY     ??? CARDIAC SURGERY  pacemaker   ??? CHG US GUIDE, TISSUE ABLATION N/A 01/22/2016    Procedure: ULTRASOUND GUIDANCE FOR, AND MONITORING OF, PARENCHYMAL TISSUE ABLATION;  Surgeon: Particia Nearing, MD;  Location: MAIN OR [facility];  Service: Transplant   ??? IR EMBOLIZATION ORGAN ISCHEMIA, TUMORS, INFAR  06/16/2016    IR EMBOLIZATION ORGAN ISCHEMIA, TUMORS, INFAR 06/16/2016 Ammie Dalton, MD IMG VIR H[facility]   ??? PR COLON CA SCRN NOT HI RSK IND N/A 02/27/2015    Procedure: COLOREC CNCR SCR;COLNSCPY NO;  Surgeon: Vonda Antigua, MD;  Location: GI PROCEDURES MEMORIAL [facility];  Service: Gastroenterology   ??? PR ENDOSCOPIC ULTRASOUND EXAM N/A 02/27/2015    Procedure: UGI ENDO; W/ENDO ULTRASOUND EXAM INCLUDES ESOPHAGUS, STOMACH, &/OR DUODENUM/JEJUNUM;  Surgeon: Vonda Antigua, MD;  Location: GI PROCEDURES MEMORIAL [facility];  Service: Gastroenterology   ??? PR ERCP BALLOON DILATE BILIARY/PANC DUCT/AMPULLA EA N/A 02/10/2017    Procedure: ERCP;WITH TRANS-ENDOSCOPIC BALLOON DILATION OF BILIARY/PANCREATIC DUCT(S) OR OF AMPULLA, INCLUDING SPHINCTERECTOMY, WHEN PERFOREMD,EACH DUCT (57846);  Surgeon: Mayford Knife, MD;  Location: GI PROCEDURES MEMORIAL [facility];  Service: Gastroenterology   ??? PR ERCP REMOVE FOREIGN BODY/STENT BILIARY/PANC DUCT N/A 02/10/2017    Procedure: ENDOSCOPIC RETROGRADE CHOLANGIOPANCREATOGRAPHY (ERCP); W/ REMOVAL OF FOREIGN BODY/STENT FROM BILIARY/PANCREATIC DUCT(S);  Surgeon: Mayford Knife, MD;  Location: GI PROCEDURES MEMORIAL [facility];  Service: Gastroenterology   ??? PR ERCP REMOVE FOREIGN BODY/STENT BILIARY/PANC DUCT N/A 06/29/2017    Procedure: ENDOSCOPIC RETROGRADE CHOLANGIOPANCREATOGRAPHY (ERCP); W/ REMOVAL OF FOREIGN BODY/STENT FROM BILIARY/PANCREATIC DUCT(S);  Surgeon: Vonda Antigua, MD;  Location: GI PROCEDURES MEMORIAL [facility];  Service: Gastroenterology   ??? PR ERCP STENT PLACEMENT BILIARY/PANCREATIC DUCT N/A 11/29/2016    Procedure: ENDOSCOPIC RETROGRADE CHOLANGIOPANCREATOGRAPHY (ERCP); WITH PLACEMENT OF ENDOSCOPIC STENT INTO BILIARY OR PANCREATIC DUCT;  Surgeon: Chriss Driver, MD;  Location: GI PROCEDURES MEMORIAL [facility];  Service: Gastroenterology   ??? PR ERCP STENT PLACEMENT BILIARY/PANCREATIC DUCT N/A 04/26/2017    Procedure: ENDOSCOPIC RETROGRADE CHOLANGIOPANCREATOGRAPHY (ERCP); WITH PLACEMENT OF ENDOSCOPIC STENT INTO BILIARY OR PANCREATIC DUCT;  Surgeon: Mayford Knife, MD;  Location: GI PROCEDURES MEMORIAL [facility];  Service: Gastroenterology   ??? PR ERCP,W/REMOVAL STONE,BIL/PANCR DUCTS N/A 11/29/2016    Procedure: ERCP; W/ENDOSCOPIC RETROGRADE REMOVAL OF CALCULUS/CALCULI FROM BILIARY &/OR PANCREATIC DUCTS;  Surgeon: Chriss Driver, MD;  Location: GI PROCEDURES MEMORIAL [facility];  Service: Gastroenterology   ??? PR ERCP,W/REMOVAL STONE,BIL/PANCR DUCTS N/A 02/10/2017    Procedure: ERCP; W/ENDOSCOPIC RETROGRADE REMOVAL OF CALCULUS/CALCULI FROM BILIARY &/OR PANCREATIC DUCTS;  Surgeon: Mayford Knife, MD;  Location: GI PROCEDURES MEMORIAL [facility];  Service: Gastroenterology   ??? PR ERCP,W/REMOVAL STONE,BIL/PANCR DUCTS N/A 04/26/2017    Procedure: ERCP; W/ENDOSCOPIC RETROGRADE REMOVAL OF CALCULUS/CALCULI FROM BILIARY &/OR PANCREATIC DUCTS;  Surgeon: Mayford Knife, MD;  Location: GI PROCEDURES MEMORIAL [facility];  Service: Gastroenterology   ??? PR ERCP,W/REMOVAL STONE,BIL/PANCR DUCTS N/A 06/29/2017    Procedure: ERCP; W/ENDOSCOPIC RETROGRADE REMOVAL OF CALCULUS/CALCULI FROM BILIARY &/OR PANCREATIC DUCTS;  Surgeon: Vonda Antigua, MD;  Location: GI PROCEDURES MEMORIAL [facility];  Service: Gastroenterology   ??? PR INSER HEART TEMP PACER ONE CHMBR N/A 10/02/2016    Procedure: Tempoarary Pacemaker Insertion;  Surgeon: Meredith Leeds, MD;  Location: [facility];  Service: Cardiology   ??? PR LAP,DIAGNOSTIC ABDOMEN N/A 01/22/2016    Procedure: Laparoscopy, Abdomen, Peritoneum, & Omentum, Diagnostic, W/Wo Collection Specimen(S) By Brushing Or Washing;  Surgeon: Particia Nearing, MD;  Location: MAIN OR [facility];  Service: Transplant   ??? PR PLACE PERCUT GASTROSTOMY TUBE N/A 11/17/2016  Procedure: UGI ENDO; W/DIRECTED PLCMT PERQ GASTROSTOMY TUBE;  Surgeon: Cletis Athens, MD;  Location: GI PROCEDURES MEMORIAL [facility];  Service: Gastroenterology   ??? PR TRACHEOSTOMY, PLANNED N/A 09/29/2016    Procedure: TRACHEOSTOMY PLANNED (SEPART PROC);  Surgeon: Katherina Mires, MD;  Location: MAIN OR [facility];  Service: Trauma   ??? PR TRANSCATH INSERT OR REPLACE LEADLESS PM VENTR N/A 10/11/2016    Procedure: Pacemaker Implant/Replace Leadless;  Surgeon: Meredith Leeds, MD;  Location: Procedure [facility];  Service: Cardiology   ??? PR TRANSPLANT LIVER,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    Procedure: Liver Allotransplantation; Orthotopic, Partial Or Whole, From Cadaver Or Living Donor, Any Age;  Surgeon: Doyce Loose, MD;  Location: MAIN OR [facility];  Service: Transplant   ??? PR TRANSPLANT,PREP DONOR LIVER, WHOLE N/A 09/15/2016    Procedure: Rogelia Boga Std Prep Cad Donor Whole Liver Gft Prior Tnsplnt,Inc Chole,Diss/Rem Surr Tissu Wo Triseg/Lobe Splt;  Surgeon: Doyce Loose, MD;  Location: MAIN OR [facility];  Service: Transplant   ??? PR UPPER GI ENDOSCOPY,BIOPSY N/A 07/17/2012    Procedure: UGI ENDOSCOPY; WITH BIOPSY, SINGLE OR MULTIPLE;  Surgeon: Alba Destine, MD;  Location: GI PROCEDURES MEMORIAL [facility];  Service: Gastroenterology   ??? PR UPPER GI ENDOSCOPY,DIAGNOSIS N/A 02/04/2014    Procedure: UGI ENDO, INCLUDE ESOPHAGUS, STOMACH, & DUODENUM &/OR JEJUNUM; DX W/WO COLLECTION SPECIMN, BY BRUSH OR WASH;  Surgeon: Wilburt Finlay, MD;  Location: GI PROCEDURES MEMORIAL [facility];  Service: Gastroenterology   ??? PR UPPER GI ENDOSCOPY,LIGAT VARIX N/A 11/05/2013    Procedure: UGI ENDO; W/BAND LIG ESOPH &/OR GASTRIC VARICES;  Surgeon: Wilburt Finlay, MD;  Location: GI PROCEDURES MEMORIAL [facility];  Service: Gastroenterology    No Known Allergies  Social History     Tobacco Use   ??? Smoking status: Never Smoker   ??? Smokeless tobacco: Never Used   ??? Tobacco comment: Smoked in high school for about 5 years.    Substance Use Topics   ??? Alcohol use: No      Current Outpatient Medications   Medication Sig Dispense Refill   ??? acetaminophen (TYLENOL) 325 MG tablet Take 1 tablet (325 mg total) by mouth every four (4) hours as needed for pain. 100 tablet 2   ??? blood sugar diagnostic (FREESTYLE TEST) Strp by Other route Four (4) times a day (before meals and nightly). 100 each 11   ??? blood-glucose meter (GLUCOSE MONITORING KIT) kit Use as instructed 1 each 0   ??? cholecalciferol, vitamin D3, 2,000 unit cap 1 capsule (2,000 Units total) by PEG Tube route daily. (Patient taking differently: Take 1 capsule by mouth daily.) 30 capsule 11   ??? digoxin (LANOXIN) 125 mcg tablet 0.5 tablets (62.5 mcg total) by G-tube route daily. (Patient taking differently: Take 0.5 tablets by mouth daily.) 15 tablet 11   ??? doxycycline (MONODOX) 100 MG capsule Take 1 tablet (100 mg) BID x 10 days with food (Patient not taking: Reported on 08/08/2017) 20 capsule 0   ??? HYDROcodone-acetaminophen (NORCO) 5-325 mg per tablet Take 1 tablet by mouth every six (6) hours as needed for pain. (Patient not taking: Reported on 07/04/2017) 12 tablet 0   ??? insulin ASPART (NOVOLOG FLEXPEN U-100 INSULIN) 100 unit/mL injection pen Inject 0.06 mL (6 Units total) under the skin Three (3) times a day before meals. (Patient taking differently: Inject 6 Units under the skin Three (3) times a day before meals. PLUS CORRECTIONS (Additional mealtime per SSI-  151-200= 2 units,  201-250= 4 units, 251-300=add 6  units, 301-350=8 units, 351-400=10 units, >400 =12 units)) 4 mL 11   ??? insulin ASPART (NOVOLOG FLEXPEN) 100 unit/mL injection pen Additional mealtime per SSI-  151-200= 2 units,  201-250= 4 units, 251-300=add 6 units, 301-350=8 units, 351-400=10 units, >400 =12 units (Patient taking differently: No sig reported) 1.8 mL 0   ??? insulin glargine (LANTUS) 100 unit/mL (3 mL) injection pen Inject 0.12 mL (12 Units total) under the skin nightly. 4 mL 11   ??? ivermectin (STROMECTOL) 3 mg Tab Take 4 tablets each day on the following days:  May 1, May 2, May 14, and May 15. (Patient not taking: Reported on 08/08/2017) 16 tablet 0   ??? lancets Misc 1 each by Miscellaneous route Four (4) times a day (before meals and nightly). 100 each 11   ??? magnesium oxide-Mg AA chelate (MAGNESIUM, AMINO ACID CHELATE,) 133 mg Tab Take 2 tablets by mouth Two (2) times a day. 100 tablet PRN   ??? melatonin 3 mg Tab Take 1 tablet (3 mg total) by mouth nightly. 30 tablet 0   ??? mirtazapine (REMERON) 15 MG tablet Take 1 tablet (15 mg total) by mouth nightly. 90 tablet 3   ??? mycophenolate (CELLCEPT) 250 mg capsule Take 2 capsules (500 mg total) by mouth Two (2) times a day. 360 capsule 3   ??? NEORAL 100 mg capsule 175 mg BID (three 25 mg capsules and one 100 mg capsule two times a day) 60 capsule 11   ??? NEORAL 25 mg capsule 175 mg BID (three 25 mg capsules and one 100 mg capsule two times a day) 180 capsule 11   ??? omeprazole (PRILOSEC) 20 MG capsule Take 1 capsule (20 mg total) by mouth daily. 30 capsule 1   ??? pen needle, diabetic (ULTICARE PEN NEEDLE) 32 gauge x 5/32 Ndle 1 each by Miscellaneous route Four (4) times a day (before meals and nightly). 90 each 3   ??? sildenafil, antihypertensive, (REVATIO) 20 mg tablet Take 40 mg by mouth Three (3) times a day.     ??? traMADol (ULTRAM) 50 mg tablet Take 1 tablet (50 mg total) by mouth every six (6) hours as needed for pain. 30 tablet 0   ??? warfarin (COUMADIN) 1 MG tablet Take as directed (Patient not taking: Reported on 08/08/2017) 90 tablet 0   ??? warfarin (COUMADIN) 3 MG tablet Take 3 mg by mouth nightly.        No current facility-administered medications for this encounter.          Objective Swallow  Cognitive-Communication Status : WFL, dysphonia  Positioning : Upright in chair, AP View, Lateral View, MBSS Chair    Presentation Methods Used During Study  Bolus Presentation : Straw Sip, Spoon, Fed self    Thin Liquids   Oral Stage: WFL  Swallow Initiation : Swallow initiation after spillage over the epiglottis  Nasopharyngeal Reflux : None noted  Pharyngeal Stage : Laryngeal Closure Reduced, Tongue Base Retraction mildly impaired, Residue after swallow  Penetration Aspiration Score: 2 - Material enters the airway, remains above the vocal folds, and is ejected from the airway.  Aspiration Volume : None  Esophageal Phase Screening : Mild backflow noted              Puree  Oral Stage : WFL  Swallow Initiation : WFL  Nasopharyngeal Reflux: None noted  Pharyngeal Stage : Residue after swallow, Tongue Base Retraction mildly impaired  Penetration Aspiration Score: 1 - Material does not enter airway  Aspiration Volume : None  Esophageal Phase Screening : (see assessment)    Solids   Oral Stage : WFL  Swallow Initiation : WFL  Nasopharyngeal Reflux: None noted  Pharyngeal Stage : Residue after swallow, Tongue Base Retraction mildly impaired  Penetration Aspiration Score: 1 - Material does not enter airway  Aspiration Volume : None  Esophageal Phase Screening : (see assessment)              Education Provided: Patient(Discussed results and recommendations)    Education Understood: Understanding verbalized          Session Duration : 35    Today's Charges (noted here with $$):     SLP Evaluations  $$ Motion Fluoro Swallow Eval- Modified [mins]: 35              I attest that I have reviewed the above information.  Signed: Reuel Boom, CCC-SLP  08/29/2017 11:23 AM

## 2017-08-30 NOTE — Unmapped (Signed)
Spoke with patients wife to inquire if they had labs drawn this month. Wife said they didn't know they were suppose to get labs this month. Patient has already taken his medication so they will go tomorrow. Message routed to patients transplant coordinator.

## 2017-08-31 ENCOUNTER — Ambulatory Visit: Admit: 2017-08-31 | Discharge: 2017-09-01 | Payer: MEDICARE

## 2017-08-31 DIAGNOSIS — Z944 Liver transplant status: Principal | ICD-10-CM

## 2017-08-31 DIAGNOSIS — K769 Liver disease, unspecified: Secondary | ICD-10-CM

## 2017-08-31 DIAGNOSIS — Z7901 Long term (current) use of anticoagulants: Secondary | ICD-10-CM

## 2017-08-31 DIAGNOSIS — Z79899 Other long term (current) drug therapy: Secondary | ICD-10-CM

## 2017-08-31 DIAGNOSIS — Z5181 Encounter for therapeutic drug level monitoring: Secondary | ICD-10-CM

## 2017-08-31 LAB — COMPREHENSIVE METABOLIC PANEL
ALBUMIN: 3.6 g/dL (ref 3.5–5.0)
ALKALINE PHOSPHATASE: 122 U/L (ref 38–126)
ALT (SGPT): 20 U/L (ref 19–72)
ANION GAP: 7 mmol/L — ABNORMAL LOW (ref 9–15)
AST (SGOT): 12 U/L — ABNORMAL LOW (ref 19–55)
BILIRUBIN TOTAL: 0.4 mg/dL (ref 0.0–1.2)
BLOOD UREA NITROGEN: 33 mg/dL — ABNORMAL HIGH (ref 7–21)
BUN / CREAT RATIO: 22
CALCIUM: 9.7 mg/dL (ref 8.5–10.2)
CO2: 22 mmol/L (ref 22.0–30.0)
CREATININE: 1.47 mg/dL — ABNORMAL HIGH (ref 0.70–1.30)
EGFR MDRD AF AMER: 58 mL/min/{1.73_m2} — ABNORMAL LOW (ref >=60)
EGFR MDRD NON AF AMER: 47 mL/min/{1.73_m2} — ABNORMAL LOW (ref >=60)
GLUCOSE RANDOM: 147 mg/dL (ref 65–179)
PROTEIN TOTAL: 6.1 g/dL — ABNORMAL LOW (ref 6.5–8.3)
SODIUM: 139 mmol/L (ref 135–145)

## 2017-08-31 LAB — CBC W/ AUTO DIFF
BASOPHILS ABSOLUTE COUNT: 0 10*9/L (ref 0.0–0.1)
BASOPHILS RELATIVE PERCENT: 0.1 %
EOSINOPHILS RELATIVE PERCENT: 2 %
HEMATOCRIT: 33.5 % — ABNORMAL LOW (ref 41.0–53.0)
HEMOGLOBIN: 11 g/dL — ABNORMAL LOW (ref 13.5–17.5)
LARGE UNSTAINED CELLS: 2 % (ref 0–4)
LYMPHOCYTES ABSOLUTE COUNT: 1 10*9/L — ABNORMAL LOW (ref 1.5–5.0)
LYMPHOCYTES RELATIVE PERCENT: 36.1 %
MEAN CORPUSCULAR HEMOGLOBIN CONC: 33 g/dL (ref 31.0–37.0)
MEAN CORPUSCULAR HEMOGLOBIN: 30.6 pg (ref 26.0–34.0)
MEAN CORPUSCULAR VOLUME: 92.8 fL (ref 80.0–100.0)
MEAN PLATELET VOLUME: 8.1 fL (ref 7.0–10.0)
MONOCYTES ABSOLUTE COUNT: 0.2 10*9/L (ref 0.2–0.8)
NEUTROPHILS ABSOLUTE COUNT: 1.5 10*9/L — ABNORMAL LOW (ref 2.0–7.5)
NEUTROPHILS RELATIVE PERCENT: 53.3 %
PLATELET COUNT: 109 10*9/L — ABNORMAL LOW (ref 150–440)
RED CELL DISTRIBUTION WIDTH: 15.5 % — ABNORMAL HIGH (ref 12.0–15.0)
WBC ADJUSTED: 2.8 10*9/L — ABNORMAL LOW (ref 4.5–11.0)

## 2017-08-31 LAB — CYCLOSPORINE, TROUGH: Lab: 82 — ABNORMAL LOW

## 2017-08-31 LAB — BILIRUBIN DIRECT: Bilirubin.glucuronidated:MCnc:Pt:Ser/Plas:Qn:: <0.1

## 2017-08-31 LAB — NEUTROPHILS RELATIVE PERCENT: Lab: 53.3

## 2017-08-31 LAB — INR: Lab: 1.11

## 2017-08-31 LAB — GAMMA GLUTAMYL TRANSFERASE: Gamma glutamyl transferase:CCnc:Pt:Ser/Plas:Qn:: 56

## 2017-08-31 LAB — PHOSPHORUS: Phosphate:MCnc:Pt:Ser/Plas:Qn:: 3.4

## 2017-08-31 LAB — MAGNESIUM: Magnesium:MCnc:Pt:Ser/Plas:Qn:: 1.9

## 2017-08-31 LAB — PROTEIN TOTAL: Protein:MCnc:Pt:Ser/Plas:Qn:: 6.1 — ABNORMAL LOW

## 2017-09-01 NOTE — Unmapped (Signed)
Spoke with wife about low CSA level and she reported patient had been forgetting his night time doses when self administer his CSA.  Stated last Thursday was the last missed dose.  Stated since then she has been double checking he is taking all the medications. Agreed to repeat labs next Thursday.     Wife and [facility] would [facility].  Provided medical records phone #.  ALso asked for letter stating patient contracted his Hep C in [place].  Let them know I would have to speak with Dr. Foy Guadalajara about this to discuss if he feels that is what occurred since I cannot find anything documented in patient chart at this time.  Patient confirmed he would send a letter template to me about how to complete this for the [place].

## 2017-09-01 NOTE — Unmapped (Signed)
I have reviewed most recent mbs. There is small Zenker's with CP bar and mild backflow. No aspiration. Min penetration also with mild oropharyngeal dysphagia.   Will discuss with patient.   If symptoms mild can observe. If severe, consider operative intervention.   I see him on July 3 to discuss.

## 2017-09-04 MED FILL — NOVOLOG FLEXPEN(BOX)/100UNIT/ML/INJ: NOVOLOG FLEXPEN(BOX)/100UNIT/ML/INJ | 25 days supply | Qty: 1 | Fill #1

## 2017-09-04 MED FILL — BASAGLAR KWIKPEN/100UNIT/SOPN: BASAGLAR KWIKPEN/100UNIT/SOPN | 125 days supply | Qty: 1 | Fill #1

## 2017-09-07 ENCOUNTER — Ambulatory Visit: Admit: 2017-09-07 | Discharge: 2017-09-08 | Payer: MEDICARE

## 2017-09-07 DIAGNOSIS — K769 Liver disease, unspecified: Secondary | ICD-10-CM

## 2017-09-07 DIAGNOSIS — Z7901 Long term (current) use of anticoagulants: Secondary | ICD-10-CM

## 2017-09-07 DIAGNOSIS — Z944 Liver transplant status: Principal | ICD-10-CM

## 2017-09-07 DIAGNOSIS — Z79899 Other long term (current) drug therapy: Secondary | ICD-10-CM

## 2017-09-07 DIAGNOSIS — Z5181 Encounter for therapeutic drug level monitoring: Secondary | ICD-10-CM

## 2017-09-07 LAB — CBC W/ AUTO DIFF
BASOPHILS ABSOLUTE COUNT: 0 10*9/L (ref 0.0–0.1)
BASOPHILS RELATIVE PERCENT: 0.3 %
EOSINOPHILS ABSOLUTE COUNT: 0.1 10*9/L (ref 0.0–0.4)
EOSINOPHILS RELATIVE PERCENT: 1.6 %
HEMOGLOBIN: 11.4 g/dL — ABNORMAL LOW (ref 13.5–17.5)
LARGE UNSTAINED CELLS: 2 % (ref 0–4)
LYMPHOCYTES ABSOLUTE COUNT: 1 10*9/L — ABNORMAL LOW (ref 1.5–5.0)
MEAN CORPUSCULAR HEMOGLOBIN CONC: 33.2 g/dL (ref 31.0–37.0)
MEAN CORPUSCULAR HEMOGLOBIN: 30.7 pg (ref 26.0–34.0)
MEAN CORPUSCULAR VOLUME: 92.4 fL (ref 80.0–100.0)
MEAN PLATELET VOLUME: 8.4 fL (ref 7.0–10.0)
MONOCYTES ABSOLUTE COUNT: 0.2 10*9/L (ref 0.2–0.8)
MONOCYTES RELATIVE PERCENT: 6.2 %
NEUTROPHILS ABSOLUTE COUNT: 1.9 10*9/L — ABNORMAL LOW (ref 2.0–7.5)
NEUTROPHILS RELATIVE PERCENT: 57.9 %
PLATELET COUNT: 124 10*9/L — ABNORMAL LOW (ref 150–440)
RED BLOOD CELL COUNT: 3.71 10*12/L — ABNORMAL LOW (ref 4.50–5.90)
RED CELL DISTRIBUTION WIDTH: 15.5 % — ABNORMAL HIGH (ref 12.0–15.0)

## 2017-09-07 LAB — PROTIME: Lab: 12.9 — ABNORMAL HIGH

## 2017-09-07 LAB — COMPREHENSIVE METABOLIC PANEL
ALBUMIN: 3.7 g/dL (ref 3.5–5.0)
ALKALINE PHOSPHATASE: 110 U/L (ref 38–126)
ALT (SGPT): 18 U/L — ABNORMAL LOW (ref 19–72)
ANION GAP: 7 mmol/L — ABNORMAL LOW (ref 9–15)
BILIRUBIN TOTAL: 0.5 mg/dL (ref 0.0–1.2)
BLOOD UREA NITROGEN: 39 mg/dL — ABNORMAL HIGH (ref 7–21)
BUN / CREAT RATIO: 25
CALCIUM: 9.7 mg/dL (ref 8.5–10.2)
CO2: 22 mmol/L (ref 22.0–30.0)
CREATININE: 1.53 mg/dL — ABNORMAL HIGH (ref 0.70–1.30)
EGFR CKD-EPI AA MALE: 53 mL/min/{1.73_m2} — ABNORMAL LOW (ref >=60)
EGFR CKD-EPI NON-AA MALE: 46 mL/min/{1.73_m2} — ABNORMAL LOW (ref >=60)
GLUCOSE RANDOM: 96 mg/dL (ref 65–179)
POTASSIUM: 5.1 mmol/L — ABNORMAL HIGH (ref 3.5–5.0)
PROTEIN TOTAL: 6.2 g/dL — ABNORMAL LOW (ref 6.5–8.3)
SODIUM: 139 mmol/L (ref 135–145)

## 2017-09-07 LAB — MAGNESIUM: Magnesium:MCnc:Pt:Ser/Plas:Qn:: 1.8

## 2017-09-07 LAB — BILIRUBIN DIRECT: Bilirubin.glucuronidated:MCnc:Pt:Ser/Plas:Qn:: 0.3

## 2017-09-07 LAB — PROTEIN TOTAL: Protein:MCnc:Pt:Ser/Plas:Qn:: 6.2 — ABNORMAL LOW

## 2017-09-07 LAB — PHOSPHORUS: Phosphate:MCnc:Pt:Ser/Plas:Qn:: 3.8

## 2017-09-07 LAB — GAMMA GLUTAMYL TRANSFERASE: Gamma glutamyl transferase:CCnc:Pt:Ser/Plas:Qn:: 57

## 2017-09-07 LAB — HEMOGLOBIN: Lab: 11.4 — ABNORMAL LOW

## 2017-09-08 LAB — CYCLOSPORINE, TROUGH: Lab: 127

## 2017-09-08 NOTE — Unmapped (Signed)
[facility] Refill Coordination Note  Specialty Medication(s): Mycophenolate 250mg , Neoral 25mg , Neoral 100mg      Additional Medications shipped: Mirtazapine 15mg , Warfarin 3mg , Mg Plus 133, Sildenafil 20mg      Jeffrey Ward, DOB: December 04, 1950  Phone: [phone] (home) , Alternate phone contact: N/A  Phone or address changes today?: No  All above HIPAA information was verified with patient's caregiver.  (Wife)  Shipping Address: [address]  [place] [address]   Insurance changes? No    Completed refill call assessment today to schedule patient's medication shipment from the [facility] [phone]).      Confirmed the medication and dosage are correct and have not changed: Yes, regimen is correct and unchanged.    Confirmed patient started or stopped the following medications in the past month:  No, there are no changes reported at this time.    Are you tolerating your medication?:  Jeffrey Ward reports tolerating the medication.    ADHERENCE    (Below is required for Medicare Part B or Transplant patients only - per drug):   Mycophenolate Mofetil 250 mg   Quantity filled last month: 120   # of tablets left on hand: 56  Neoral 25 mg   Quantity filled last month: 180   # of tablets left on hand: 84  Neoral 100 mg   Quantity filled last month: 60   # of tablets left on hand: 28    Did you miss any doses in the past 4 weeks? No missed doses reported.    FINANCIAL/SHIPPING    Delivery Scheduled: Yes, Expected medication delivery date: 09/12/2017     The patient will receive an FSI print out for each medication shipped and additional FDA Medication Guides as required.  Patient education from Artemus or Robet Leu may also be included in the shipment    Jeffrey Ward did not have any additional questions at this time.    Delivery address validated in FSI scheduling system: Yes, address listed in FSI is correct.    We will follow up with patient monthly for standard refill processing and delivery. Thank you,  Jeffrey Ward  Jeffrey Ward   [facility] Specialty Pharmacist

## 2017-09-11 MED FILL — NEORAL/100MG/CAP: NEORAL/100MG/CAP | 30 days supply | Qty: 60 | Fill #3

## 2017-09-11 MED FILL — MYCOPHENOLATE MOFETIL/250MG/CAPS: MYCOPHENOLATE MOFETIL/250MG/CAPS | 30 days supply | Qty: 120 | Fill #6

## 2017-09-11 MED FILL — WARFARIN/3MG/TAB: WARFARIN/3MG/TAB | 30 days supply | Qty: 30 | Fill #6

## 2017-09-11 MED FILL — MIRTAZAPINE/15MG/TABS: MIRTAZAPINE/15MG/TABS | 30 days supply | Qty: 30 | Fill #1

## 2017-09-11 MED FILL — MG PLUS PROTEIN 133 MG/133 MG/TABS: MG PLUS PROTEIN 133 MG/133 MG/TABS | 50 days supply | Qty: 100 | Fill #3

## 2017-09-11 MED FILL — NEORAL/25MG/CAP: NEORAL/25MG/CAP | 30 days supply | Qty: 180 | Fill #3

## 2017-09-11 MED FILL — SILDENAFIL CITRATE/20MG/TABS: SILDENAFIL CITRATE/20MG/TABS | 30 days supply | Qty: 180 | Fill #4

## 2017-09-11 NOTE — Unmapped (Signed)
Per PharmD Mincemoyer patient new CSA goal 100-150.

## 2017-09-13 ENCOUNTER — Institutional Professional Consult (permissible substitution)
Admit: 2017-09-13 | Discharge: 2017-09-13 | Payer: MEDICARE | Attending: Student in an Organized Health Care Education/Training Program | Primary: Student in an Organized Health Care Education/Training Program

## 2017-09-13 DIAGNOSIS — J3800 Paralysis of vocal cords and larynx, unspecified: Secondary | ICD-10-CM

## 2017-09-13 DIAGNOSIS — R490 Dysphonia: Principal | ICD-10-CM

## 2017-09-13 MED ORDER — PREDNISONE 10 MG TABLET: 30 mg | tablet | Freq: Every day | 0 refills | 0 days | Status: AC

## 2017-09-13 MED ORDER — PREDNISONE 10 MG TABLET
Freq: Every day | ORAL | 0 refills | 0.00000 days | Status: CP
Start: 2017-09-13 — End: 2017-11-10

## 2017-09-13 MED FILL — PREDNISONE/10MG/TABS: PREDNISONE/10MG/TABS | 3 days supply | Qty: 9 | Fill #0

## 2017-09-13 NOTE — Unmapped (Signed)
Surgeon: Nira Retort. Sherryll Burger, MD    Pre-op Diagnosis:   Right vocal fold immobility  Dysphonia    Post-Op Diagnosis:   Right vocal fold immobility  Dysphonia    Procedures:   Flexible laryngoscopy with injection augmentation vocal fold (CPT: [address])     A time out identifying the patient, the procedure, the location of the procedure and any concerns was performed prior to beginning the procedure.  Written consent was obtained.    Procedure:   The patient was seated in an upright position.  The nose was de-congested and anesthetized with oxymetazoline and 1% lidocaine.  Inhalation of 1% lidocaine was also undertaken.  The injectable material was appropriately prepared in the usual fashion.  The patient was prepped and draped in the usual fashion.  1% lidocaine with 1:100,000 epinephrine was used for local anesthesia of the skin overlying the thyrohyoid membrane. Flexible laryngoscope was placed into the nasal cavity and advanced to the larynx.  Transcervical laryngeal gargle local anesthesia was undertaken.  22G needle was inserted over they thyroid notch and into the laryngeal lumen.  Approximately 1.71ml 4% lidocaine was applied over the glottis with the patient in phonation and into the preepiglottic space.  Once appropriate local anesthesia was obtained, we proceeded with augmentation.     22G needle was inserted over the thyroid notch and into the laryngeal lumen.  Needle was inserted into the appropriate vocal fold in a lateral position.  Approximately 0.6 ml of cymetra was injected into the right fold.     Findings:     A total of 0.74ml cymetra was injected. The patient tolerated the procedure well.     Estimated Blood Loss (in mL): 1    Complications:  None     Attending Notes:     I performed the entire procedure

## 2017-09-13 NOTE — Unmapped (Signed)
We discussed results of recent MBS:    Pt presents with a mild pharyngeal dysphagia c/b reduced BOT retraction which led to mild vallecula residue (all consistencies). Trace post-cricoid residue of solids. Trace penetration x 1 of thin liquids before swallows (cleared). No aspiration observed. Cervical esophagus: mild accumulation of boluses at tissue protrusion (~C6) c/w the appearance of a small Zenker's diverticulum. Occasional mild backflow of boluses from this area.         We discussed his symptoms of coughing while eating.  It is unclear if this is more related to generalized cough or his oropharyngeal dysphasia from vallecular residue or actually from the prominent cricopharyngeal bar and small fingers.  I like to see him back in 3 weeks time to assess his symptoms to perform a functional endoscopic evaluation of swallow to determine when it is he is actually coughing.  Zenker's is small and only causing mild backflow.  We could perform surgical intervention on this however I want to ensure that it would improve his symptoms as he is quite high risk from a surgical standpoint with recent transplant surgery pacemaker and on anticoagulation.  He understands we will rediscuss at his next visit

## 2017-09-13 NOTE — Unmapped (Addendum)
POST-PROCEDURE INSTRUCTIONS     General Post-Procedure Instructions:     You may resume your regular diet once your anesthetic has worn off.        You may resume your regular physical activities     You may resume all your normal medications, unless specifically instructed differently by your Doctor.            Please contact our office if:        You experience shortness of breath or difficulty breathing     You have any worsening swallowing liquids or solids     You have a fever over 101.5     You have any bleeding or severe bruising     You experience any other unexpected symptoms including but not limited to changes in mental status, weakness, nausea, vomiting.        Please rest your voice for 48 hours.        Follow up:     3 weeks     [facility] - Department of Otolaryngology-Head & Neck Surgery       [facility] [facility]       [email or web address]       Clinic Phone:  [phone] - Clinic Fax:  [phone]       Nursing line [facility]     Rayvon Char  Phone:  [phone]

## 2017-09-13 NOTE — Unmapped (Signed)
Addended byChristene Lye on: 09/13/2017 01:26 PM     Modules accepted: Orders

## 2017-09-18 MED FILL — DIGOXIN/0.125MG/TABS: DIGOXIN/0.125MG/TABS | 30 days supply | Qty: 15 | Fill #8

## 2017-09-20 NOTE — Unmapped (Signed)
Per Dr. Julieta Gutting there is no basis for writing a letter to the [facility] stating patient received Hep C from the military since there is no way to prove that.

## 2017-09-21 ENCOUNTER — Ambulatory Visit
Admit: 2017-09-21 | Discharge: 2017-09-22 | Payer: MEDICARE | Attending: Student in an Organized Health Care Education/Training Program | Primary: Student in an Organized Health Care Education/Training Program

## 2017-09-21 ENCOUNTER — Ambulatory Visit: Admit: 2017-09-21 | Discharge: 2017-09-22 | Payer: MEDICARE | Attending: Registered" | Primary: Registered"

## 2017-09-21 ENCOUNTER — Ambulatory Visit: Admit: 2017-09-21 | Discharge: 2017-09-22 | Payer: MEDICARE

## 2017-09-21 ENCOUNTER — Institutional Professional Consult (permissible substitution): Admit: 2017-09-21 | Discharge: 2017-09-22 | Payer: MEDICARE

## 2017-09-21 DIAGNOSIS — C220 Liver cell carcinoma: Secondary | ICD-10-CM

## 2017-09-21 DIAGNOSIS — Z8619 Personal history of other infectious and parasitic diseases: Secondary | ICD-10-CM

## 2017-09-21 DIAGNOSIS — Z944 Liver transplant status: Secondary | ICD-10-CM

## 2017-09-21 DIAGNOSIS — K769 Liver disease, unspecified: Secondary | ICD-10-CM

## 2017-09-21 DIAGNOSIS — Z8505 Personal history of malignant neoplasm of liver: Secondary | ICD-10-CM

## 2017-09-21 DIAGNOSIS — Z7952 Long term (current) use of systemic steroids: Principal | ICD-10-CM

## 2017-09-21 DIAGNOSIS — E119 Type 2 diabetes mellitus without complications: Secondary | ICD-10-CM

## 2017-09-21 DIAGNOSIS — Z794 Long term (current) use of insulin: Secondary | ICD-10-CM

## 2017-09-21 DIAGNOSIS — G8929 Other chronic pain: Secondary | ICD-10-CM

## 2017-09-21 DIAGNOSIS — R5381 Other malaise: Secondary | ICD-10-CM

## 2017-09-21 DIAGNOSIS — M545 Low back pain: Secondary | ICD-10-CM

## 2017-09-21 DIAGNOSIS — Z79899 Other long term (current) drug therapy: Secondary | ICD-10-CM

## 2017-09-21 DIAGNOSIS — I455 Other specified heart block: Principal | ICD-10-CM

## 2017-09-21 DIAGNOSIS — B259 Cytomegaloviral disease, unspecified: Secondary | ICD-10-CM

## 2017-09-21 LAB — COMPREHENSIVE METABOLIC PANEL
ALBUMIN: 3.7 g/dL (ref 3.5–5.0)
ALKALINE PHOSPHATASE: 103 U/L (ref 38–126)
ALT (SGPT): 17 U/L — ABNORMAL LOW (ref 19–72)
ANION GAP: 9 mmol/L (ref 9–15)
AST (SGOT): 9 U/L — ABNORMAL LOW (ref 19–55)
BILIRUBIN TOTAL: 0.4 mg/dL (ref 0.0–1.2)
BLOOD UREA NITROGEN: 49 mg/dL — ABNORMAL HIGH (ref 7–21)
BUN / CREAT RATIO: 31
CALCIUM: 9.7 mg/dL (ref 8.5–10.2)
CHLORIDE: 105 mmol/L (ref 98–107)
CO2: 22 mmol/L (ref 22.0–30.0)
EGFR CKD-EPI AA MALE: 51 mL/min/{1.73_m2} — ABNORMAL LOW (ref >=60)
GLUCOSE RANDOM: 176 mg/dL — ABNORMAL HIGH (ref 65–99)
POTASSIUM: 5 mmol/L (ref 3.5–5.0)
PROTEIN TOTAL: 6.1 g/dL — ABNORMAL LOW (ref 6.5–8.3)
SODIUM: 136 mmol/L (ref 135–145)

## 2017-09-21 LAB — CBC W/ AUTO DIFF
BASOPHILS ABSOLUTE COUNT: 0 10*9/L (ref 0.0–0.1)
BASOPHILS RELATIVE PERCENT: 0.3 %
EOSINOPHILS ABSOLUTE COUNT: 0.1 10*9/L (ref 0.0–0.4)
EOSINOPHILS RELATIVE PERCENT: 2.3 %
HEMATOCRIT: 33.9 % — ABNORMAL LOW (ref 41.0–53.0)
HEMOGLOBIN: 11.2 g/dL — ABNORMAL LOW (ref 13.5–17.5)
LARGE UNSTAINED CELLS: 2 % (ref 0–4)
LYMPHOCYTES RELATIVE PERCENT: 43.1 %
MEAN CORPUSCULAR HEMOGLOBIN CONC: 33 g/dL (ref 31.0–37.0)
MEAN CORPUSCULAR HEMOGLOBIN: 30.1 pg (ref 26.0–34.0)
MEAN CORPUSCULAR VOLUME: 91.2 fL (ref 80.0–100.0)
MEAN PLATELET VOLUME: 8.9 fL (ref 7.0–10.0)
MONOCYTES ABSOLUTE COUNT: 0.3 10*9/L (ref 0.2–0.8)
MONOCYTES RELATIVE PERCENT: 6.4 %
NEUTROPHILS ABSOLUTE COUNT: 1.8 10*9/L — ABNORMAL LOW (ref 2.0–7.5)
NEUTROPHILS RELATIVE PERCENT: 45.7 %
PLATELET COUNT: 114 10*9/L — ABNORMAL LOW (ref 150–440)
RED BLOOD CELL COUNT: 3.72 10*12/L — ABNORMAL LOW (ref 4.50–5.90)
RED CELL DISTRIBUTION WIDTH: 15 % (ref 12.0–15.0)

## 2017-09-21 LAB — PROTIME-INR: PROTIME: 12.8 s (ref 10.2–12.8)

## 2017-09-21 LAB — HEPATITIS C RNA, QUANTITATIVE, PCR

## 2017-09-21 LAB — GAMMA GLUTAMYL TRANSFERASE: Gamma glutamyl transferase:CCnc:Pt:Ser/Plas:Qn:: 47

## 2017-09-21 LAB — HCV RNA: Hepatitis C virus RNA:PrThr:Pt:Ser/Plas:Ord:Probe.amp.tar: NOT DETECTED

## 2017-09-21 LAB — BUN / CREAT RATIO: Urea nitrogen/Creatinine:MRto:Pt:Ser/Plas:Qn:: 31

## 2017-09-21 LAB — MONOCYTES RELATIVE PERCENT: Lab: 6.4

## 2017-09-21 LAB — CYCLOSPORINE, TROUGH: Lab: 123

## 2017-09-21 LAB — PHOSPHORUS: Phosphate:MCnc:Pt:Ser/Plas:Qn:: 3.2

## 2017-09-21 LAB — PROTIME: Lab: 12.8

## 2017-09-21 LAB — CMV QUANT: Lab: 0

## 2017-09-21 LAB — ESTIMATED AVERAGE GLUCOSE: Estimated average glucose:MCnc:Pt:Bld:Qn:Estimated from glycated hemoglobin: 137

## 2017-09-21 LAB — CMV DNA, QUANTITATIVE, PCR: CMV VIRAL LD: NOT DETECTED

## 2017-09-21 LAB — MAGNESIUM: Magnesium:MCnc:Pt:Ser/Plas:Qn:: 1.9

## 2017-09-21 LAB — BILIRUBIN, DIRECT: BILIRUBIN DIRECT: <0.1 mg/dL (ref 0.00–0.40)

## 2017-09-21 LAB — AFP-TUMOR MARKER: Alpha-1-Fetoprotein.tumor marker:MCnc:Pt:Ser/Plas:Qn:: 2

## 2017-09-21 LAB — BILIRUBIN DIRECT: Bilirubin.glucuronidated:MCnc:Pt:Ser/Plas:Qn:: <0.1

## 2017-09-21 NOTE — Unmapped (Signed)
Pacer analy dual chamber with reprog  Date/Time: 09/21/2017 10:51 AM  Performed by: Lawana Pai, RN  Authorized by: Joretta Bachelor, MD         Patient has a Medtronic MRI conditional system and is clear for MRI at this time.  ??  GENERATOR INFORMATION  Manufacter &??Model #  Medtronic [id number] Micra VR TCP-Leadless Pacemaker    LEAD INFORMATION  Abandoned/Epicardical Lead(s) ?? No ??  ??  TESTING:  ??  Sensing (mV):??????????????????????????????????????????????????????????????????????????  RV??????????????PRE:????????8.0   RV POST: 9.7????????  ??  Capture Threshold (V@_ms ):??????????????????????????  RV??????????????PRE:????????0.50V@0 .24 ms??????????????RV POST: 0.50 V @ 0.24 ms????????????????????????????????????  ??  Impedance (between 200-1500ohms):????????????????????????????????????????????????????????????????????  RV??????????????PRE:??510????????????????????????????????????RV POST: 490????????????????????????????????????????????????????????????????????????????????????  ??  MR CONDITIONAL STATUS AND MANAGEMENT  ??  MR CONDITIONAL SYSTEM????  Risks and Potential Complications discussed with patient and patient consents to procedure  System has been implanted more than 6 weeks? Yes   Pacing-Dependent: ??No     Surescan activated prior to start of scan and mode changed to OVO.    Surescan cancelled at end of scan which turned mode back to VVIR @ 50 bpm as previously set

## 2017-09-21 NOTE — Unmapped (Signed)
Patient seen with Dr. Matilde Haymaker.  Patient in clinic with spouse. Patient has gained 7 pounds since last visit and looks well.  Patient dismissed prior to seeing nutrition.  Per Dr. Matilde Haymaker will arrange G-tube button removal with GI since most like will need surgery to have the site heal properly.      Messaged PharmD Mincemoyer about coumadin dose since INR subtherapeutic.  Will arrange for patient to get cardiology f/u appointment to ensure current plan of care for heart is what is needed.      Dr. Matilde Haymaker also stated he is unable to write letter to [facility] for stating patient received Hep C in [place] since there is no way to be 100% in knowing this is how patient receiving the infection.  Will continue to monitor patient labs since per MRI read from Dr. Matilde Haymaker he has a bile duct stone.  Will follow up with provider once the final read on MRI is back.   See Providers note for further detail.

## 2017-09-21 NOTE — Unmapped (Addendum)
Per PharmD Mincemoyer patient to take coumadin 4.5 mg (which is 1 and 1/2 of the 3 mg tabs) Mon/Wed/Fri and 3 mg all other days.  Wife verbalized understanding.

## 2017-09-21 NOTE — Unmapped (Signed)
Encounter opened in error. Pt left without being seen. Reportedly had no nutrition-related concerns; presented to clinic with good appetite, intake and recent wt gain.

## 2017-09-21 NOTE — Unmapped (Signed)
TRANSPLANT SURGERY CLINIC NOTE    Assessment and Plan  Jeffrey Ward is a 67 y.o. 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    - Referral to cardiology to assess continued need for pacemaker and Coumadin  - G tube removal    Subjective  Jeffrey Ward is a 66 y.o. 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    Patient has had a long postop course and has been hospitalized multiple times since his transplant. He was last seen in transplant clinic in May. Since then, patient reported that he has been doing well. He's gained 20 Lb of weight and has more energy. Still having episodes of weakness, but he has not fallen since he was last seen. Always walks with his cane for support. Denied any CP, SOB, abdominal pain, dysuria. Having regular BMs.    Patient asked about getting his G tube out during this visit. It was initially placed during his hospitalization after his liver transplant for feeding.    Objective    Vitals:    09/21/17 1154   BP: 128/70   Pulse: 73   Resp: 16   Temp: 36.3 ??C   TempSrc: Tympanic   Weight: 65.6 kg (144 lb 11.2 oz)   Height: 172.7 cm (5' 7.99)      Body mass index is 22.01 kg/m??.     Physical Exam:    General Appearance:   No acute distress  Lungs:                Clear to auscultation bilaterally  Heart:                           Regular rate and rhythm  Abdomen:                Soft, non-tender, non-distended  Extremities:              Warm and well perfused        Data Review:  All lab results last 24 hours:    Recent Results (from the past 24 hour(s))   AFP tumor marker    Collection Time: 09/21/17  8:08 AM   Result Value Ref Range    AFP-Tumor Marker 2 <8 ng/mL   PT-INR    Collection Time: 09/21/17  8:08 AM   Result Value Ref Range    PT 12.8 10.2 - 12.8 sec    INR 1.12    Gamma GT    Collection Time: 09/21/17  8:08 AM   Result Value Ref Range    GGT 47 12 - 109 U/L   Magnesium Level    Collection Time: 09/21/17  8:08 AM   Result Value Ref Range    Magnesium 1.9 1.6 - 2.2 mg/dL   Phosphorus Level    Collection Time: 09/21/17  8:08 AM   Result Value Ref Range    Phosphorus 3.2 2.9 - 4.7 mg/dL   Bilirubin, Direct    Collection Time: 09/21/17  8:08 AM   Result Value Ref Range    Bilirubin, Direct <0.10 0.00 - 0.40 mg/dL   Comprehensive Metabolic Panel    Collection Time: 09/21/17  8:08 AM   Result Value Ref Range    Sodium 136 135 - 145 mmol/L    Potassium 5.0 3.5 - 5.0 mmol/L  Chloride 105 98 - 107 mmol/L    CO2 22.0 22.0 - 30.0 mmol/L    Anion Gap 9 9 - 15 mmol/L    BUN 49 (H) 7 - 21 mg/dL    Creatinine 1.61 (H) 0.70 - 1.30 mg/dL    BUN/Creatinine Ratio 31     EGFR CKD-EPI Non-African American, Male 44 (L) >=60 mL/min/1.58m2    EGFR CKD-EPI African American, Male 51 (L) >=60 mL/min/1.68m2    Glucose 176 (H) 65 - 99 mg/dL    Calcium 9.7 8.5 - 09.6 mg/dL    Albumin 3.7 3.5 - 5.0 g/dL    Total Protein 6.1 (L) 6.5 - 8.3 g/dL    Total Bilirubin 0.4 0.0 - 1.2 mg/dL    AST 9 (L) 19 - 55 U/L    ALT 17 (L) 19 - 72 U/L    Alkaline Phosphatase 103 38 - 126 U/L   Hemoglobin A1c    Collection Time: 09/21/17  8:09 AM   Result Value Ref Range    Hemoglobin A1C 6.4 (H) 4.8 - 5.6 %    Estimated Average Glucose 137 mg/dL   CBC w/ Differential    Collection Time: 09/21/17  8:09 AM   Result Value Ref Range    WBC 3.9 (L) 4.5 - 11.0 10*9/L    RBC 3.72 (L) 4.50 - 5.90 10*12/L    HGB 11.2 (L) 13.5 - 17.5 g/dL    HCT 04.5 (L) 40.9 - 53.0 %    MCV 91.2 80.0 - 100.0 fL    MCH 30.1 26.0 - 34.0 pg    MCHC 33.0 31.0 - 37.0 g/dL    RDW 81.1 91.4 - 78.2 %    MPV 8.9 7.0 - 10.0 fL    Platelet 114 (L) 150 - 440 10*9/L    Neutrophils % 45.7 %    Lymphocytes % 43.1 %    Monocytes % 6.4 %    Eosinophils % 2.3 %    Basophils % 0.3 %    Absolute Neutrophils 1.8 (L) 2.0 - 7.5 10*9/L    Absolute Lymphocytes 1.7 1.5 - 5.0 10*9/L    Absolute Monocytes 0.3 0.2 - 0.8 10*9/L    Absolute Eosinophils 0.1 0.0 - 0.4 10*9/L    Absolute Basophils 0.0 0.0 - 0.1 10*9/L    Large Unstained Cells 2 0 - 4 %         Imaging:

## 2017-09-22 MED ORDER — WARFARIN 3 MG TABLET
2 refills | 0.00000 days | Status: CP
Start: 2017-09-22 — End: 2017-09-22

## 2017-09-22 MED ORDER — WARFARIN 3 MG TABLET: each | 2 refills | 0 days

## 2017-09-22 NOTE — Unmapped (Signed)
CSW met with pt and his wife briefly in the waiting area of the txp clinic. CSW has seen and followed the pt during his multiple readmissions post surgery. Family expressed no concerns at this time.

## 2017-09-22 NOTE — Unmapped (Addendum)
Spoke with VIR and GI procedures Charge RNs both confirmed they remove G-tubes but do not do any stiches with it.  As a result Dr. Matilde Haymaker stated he would remove it so sent f/u email to decide if coumadin should be held prior to removal.  ALso stated since MRI was abnormal we will continue to check labs and patient to be seen in clinic in 3 months. Wife verbalized understanding.     Confirmed she can come for the 2:15 apt. On MOnday.  Per Dr. Matilde Haymaker patient to HOLD coumadin dose starting today to prep for it.

## 2017-09-25 ENCOUNTER — Ambulatory Visit: Admit: 2017-09-25 | Discharge: 2017-09-26 | Payer: MEDICARE

## 2017-09-25 DIAGNOSIS — Z944 Liver transplant status: Principal | ICD-10-CM

## 2017-09-25 NOTE — Unmapped (Signed)
TRANSPLANT SURGERY CLINIC NOTE    Assessment and Plan  Jeffrey Ward is a 67 y.o. 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   -G tube removed at bedside today without complication. No bleeding seen.   -Okay to resume Coumadin  -Immunosuppression includes:    Cyclosporine with goal 100-150  -Return to clinic in 3 months as previously scheduled given abnromal MRI    Patient seen and discussed with Dr. Celine Mans.      Anabel Halon, MD [facility]  Transplant Hepatology Fellow PGY7  Pager [phone]      Subjective  Jeffrey Ward is a 67 y.o. 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 He held his Coumadin starting last week without issue.      Objective    Vitals:    09/25/17 1425   Resp: 18   Temp: 36.8 ??C   TempSrc: Tympanic   Weight: 65.2 kg (143 lb 11.2 oz)      Body mass index is 21.85 kg/m??.     Physical Exam:  General Appearance:   No acute distress  Lungs:                Clear to auscultation bilaterally  Heart:                           Regular rate and rhythm  Abdomen:                Soft, non-tender, non-distended, G tube in place without significant induration or erythema  Extremities:              Warm and well perfused      Data Review:  Lab Results   Component Value Date    WBC 3.9 (L) 09/21/2017    HGB 11.2 (L) 09/21/2017    HCT 33.9 (L) 09/21/2017    PLT 114 (L) 09/21/2017       Lab Results   Component Value Date    NA 136 09/21/2017    K 5.0 09/21/2017    CL 105 09/21/2017    CO2 22.0 09/21/2017    BUN 49 (H) 09/21/2017    CREATININE 1.58 (H) 09/21/2017    GLU 176 (H) 09/21/2017    CALCIUM 9.7 09/21/2017    MG 1.9 09/21/2017    PHOS 3.2 09/21/2017       Lab Results   Component Value Date    BILITOT 0.4 09/21/2017    BILIDIR <0.10 09/21/2017    PROT 6.1 (L) 09/21/2017    ALBUMIN 3.7 09/21/2017    ALT 17 (L) 09/21/2017    AST 9 (L) 09/21/2017    ALKPHOS 103 09/21/2017    GGT 47 09/21/2017 Lab Results   Component Value Date    LABPROT 14.5 (H) 01/09/2014    INR 1.12 09/21/2017    APTT 34.0 06/08/2017     Imaging: none recently

## 2017-09-26 NOTE — Unmapped (Signed)
Mailed donor letter to [facility].

## 2017-10-02 MED ORDER — WARFARIN 3 MG TABLET: tablet | 2 refills | 0 days | Status: AC

## 2017-10-02 MED ORDER — WARFARIN 3 MG TABLET
ORAL_TABLET | 2 refills | 0.00000 days | Status: CP
Start: 2017-10-02 — End: 2017-10-02

## 2017-10-02 MED FILL — WARFARIN/3MG/TAB: WARFARIN/3MG/TAB | 29 days supply | Qty: 35 | Fill #0

## 2017-10-03 ENCOUNTER — Ambulatory Visit: Admit: 2017-10-03 | Discharge: 2017-11-01 | Payer: MEDICARE | Attending: Audiologist | Primary: Audiologist

## 2017-10-03 ENCOUNTER — Ambulatory Visit
Admit: 2017-10-03 | Discharge: 2017-10-04 | Payer: MEDICARE | Attending: Student in an Organized Health Care Education/Training Program | Primary: Student in an Organized Health Care Education/Training Program

## 2017-10-03 DIAGNOSIS — R490 Dysphonia: Principal | ICD-10-CM

## 2017-10-03 DIAGNOSIS — R1310 Dysphagia, unspecified: Secondary | ICD-10-CM

## 2017-10-03 DIAGNOSIS — J3801 Paralysis of vocal cords and larynx, unilateral: Secondary | ICD-10-CM

## 2017-10-03 NOTE — Unmapped (Signed)
Otolaryngology     Reason for visit:  Follow up     History of Present Illness:     Recall, initial presentation on 01/30/17 [name] is a 67 y.o. 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 He spent 3 months in the hospital following his transplant he had respiratory compromise and ultimately had tracheostomy tube for much of that stay. He was decannulated in September before discharge home. 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  Patient reports he lost his voice following liver transplant episode 3 months ago. He states that since surgery his voice is breathy and weak. He cannot control his pitch. His voice symptoms do not fluctuate throughout the day and he has not noticed anything that makes it better or worse. He does have a cough throughout the day. He also endorses globus sensation. He denies dysphagia. He is maintained on an oral diet but has not had his G-tube removed yet due to surgeon preference. He has never had voice issues before and has never seen an ENT doctor for any related issues. He does endorse consistent shortness of breath with exertion and notes that this is likely secondary to his recovery process from his liver transplant. She does take Prilosec for his GERD. Thinks that this is stable. No coughing or choking while eating.    He was noted to have a right vocal fold paralysis with significant breathy voice and glottal insufficiency.   For multilpe reasons intervention was delayed.   He has had quite a complicated course.     He did have anemia and hemothorax.     He underwent in office injection cymetra on 05/31/17 0.59ml right fold.     Since then, he has had multiple set backs in his GI care. He had a fall and possible liver abscess. He underwent intubation and ERCP in April. He has been admitted to the hospital twice.     08/08/17: He returns today in follow-up.  He reports no significant improvement in the voice following injection.  He continues to be weak and breathy.  He is also had increasing coughing on foods and liquids.  No pneumonia.  He has had significant illnesses since his injection however as stated above.    He was taken back to [facility] on 09/13/17 for in office [facility] injection TTH cymetra 0.73ml.     10/03/17:   He returns after injection. The voice improved but by only about 50%. The voice has started to decline. He also complains of coughing while eating. This has not changed with injection augmentation. He does state he is louder and easier, but still weak from voice standpoint.      Past Medical History:  Past Medical History:   Diagnosis Date   ??? Atrial fibrillation (CMS-HCC)    ??? Basal cell carcinoma    ??? Cancer (CMS-HCC)    ??? Cirrhosis (CMS-HCC)    ??? Diabetes (CMS-HCC)    ??? Hepatitis C 07/17/2012   ??? Liver disease    ??? Low back pain 07/17/2012   ??? Varices, esophageal (CMS-HCC)        Past Surgical History:  Past Surgical History:   Procedure Laterality Date   ??? ANKLE SURGERY     ??? BACK SURGERY     ???  BACK SURGERY     ??? CARDIAC SURGERY      pacemaker   ??? CHG US GUIDE, TISSUE ABLATION N/A 01/22/2016    Procedure: ULTRASOUND GUIDANCE FOR, AND MONITORING OF, PARENCHYMAL TISSUE ABLATION;  Surgeon: Particia Nearing, MD;  Location: MAIN OR [facility];  Service: Transplant   ??? IR EMBOLIZATION ORGAN ISCHEMIA, TUMORS, INFAR  06/16/2016    IR EMBOLIZATION ORGAN ISCHEMIA, TUMORS, INFAR 06/16/2016 Ammie Dalton, MD IMG VIR H[facility]   ??? PR COLON CA SCRN NOT HI RSK IND N/A 02/27/2015    Procedure: COLOREC CNCR SCR;COLNSCPY NO;  Surgeon: Vonda Antigua, MD;  [facility];  Service: Gastroenterology   ??? PR ENDOSCOPIC ULTRASOUND EXAM N/A 02/27/2015    Procedure: UGI ENDO; W/ENDO ULTRASOUND EXAM INCLUDES ESOPHAGUS, STOMACH, &/OR DUODENUM/JEJUNUM; Surgeon: Vonda Antigua, MD;  Location: [facility];  Service: Gastroenterology   ??? PR ERCP BALLOON DILATE BILIARY/PANC DUCT/AMPULLA EA N/A 02/10/2017    Procedure: ERCP;WITH TRANS-ENDOSCOPIC BALLOON DILATION OF BILIARY/PANCREATIC DUCT(S) OR OF AMPULLA, INCLUDING SPHINCTERECTOMY, WHEN PERFOREMD,EACH DUCT (16109);  Surgeon: Mayford Knife, MD;  Location: [facility];  Service: Gastroenterology   ??? PR ERCP REMOVE FOREIGN BODY/STENT BILIARY/PANC DUCT N/A 02/10/2017    Procedure: ENDOSCOPIC RETROGRADE CHOLANGIOPANCREATOGRAPHY (ERCP); W/ REMOVAL OF FOREIGN BODY/STENT FROM BILIARY/PANCREATIC DUCT(S);  Surgeon: Mayford Knife, MD;  Location: [facility];  Service: Gastroenterology   ??? PR ERCP REMOVE FOREIGN BODY/STENT BILIARY/PANC DUCT N/A 06/29/2017    Procedure: ENDOSCOPIC RETROGRADE CHOLANGIOPANCREATOGRAPHY (ERCP); W/ REMOVAL OF FOREIGN BODY/STENT FROM BILIARY/PANCREATIC DUCT(S);  Surgeon: Vonda Antigua, MD;  Location: [facility];  Service: Gastroenterology   ??? PR ERCP STENT PLACEMENT BILIARY/PANCREATIC DUCT N/A 11/29/2016    Procedure: ENDOSCOPIC RETROGRADE CHOLANGIOPANCREATOGRAPHY (ERCP); WITH PLACEMENT OF ENDOSCOPIC STENT INTO BILIARY OR PANCREATIC DUCT;  Surgeon: Chriss Driver, MD;  Location: [facility];  Service: Gastroenterology   ??? PR ERCP STENT PLACEMENT BILIARY/PANCREATIC DUCT N/A 04/26/2017    Procedure: ENDOSCOPIC RETROGRADE CHOLANGIOPANCREATOGRAPHY (ERCP); WITH PLACEMENT OF ENDOSCOPIC STENT INTO BILIARY OR PANCREATIC DUCT;  Surgeon: Mayford Knife, MD;  Location: [facility];  Service: Gastroenterology   ??? PR ERCP,W/REMOVAL STONE,BIL/PANCR DUCTS N/A 11/29/2016    Procedure: ERCP; W/ENDOSCOPIC RETROGRADE REMOVAL OF CALCULUS/CALCULI FROM BILIARY &/OR PANCREATIC DUCTS;  Surgeon: Chriss Driver, MD;  Location: [facility];  Service: Gastroenterology   ??? PR ERCP,W/REMOVAL STONE,BIL/PANCR DUCTS N/A 02/10/2017    Procedure: ERCP; W/ENDOSCOPIC RETROGRADE REMOVAL OF CALCULUS/CALCULI FROM BILIARY &/OR PANCREATIC DUCTS;  Surgeon: Mayford Knife, MD;  Location: [facility];  Service: Gastroenterology   ??? PR ERCP,W/REMOVAL STONE,BIL/PANCR DUCTS N/A 04/26/2017    Procedure: ERCP; W/ENDOSCOPIC RETROGRADE REMOVAL OF CALCULUS/CALCULI FROM BILIARY &/OR PANCREATIC DUCTS;  Surgeon: Mayford Knife, MD;  Location: [facility];  Service: Gastroenterology   ??? PR ERCP,W/REMOVAL STONE,BIL/PANCR DUCTS N/A 06/29/2017    Procedure: ERCP; W/ENDOSCOPIC RETROGRADE REMOVAL OF CALCULUS/CALCULI FROM BILIARY &/OR PANCREATIC DUCTS;  Surgeon: Vonda Antigua, MD;  Location: [facility];  Service: Gastroenterology   ??? PR INSER HEART TEMP PACER ONE CHMBR N/A 10/02/2016    Procedure: Tempoarary Pacemaker Insertion;  Surgeon: Meredith Leeds, MD;  Location: [facility];  Service: Cardiology   ??? PR LAP,DIAGNOSTIC ABDOMEN N/A 01/22/2016    Procedure: Laparoscopy, Abdomen, Peritoneum, & Omentum, Diagnostic, W/Wo Collection Specimen(S) By Brushing Or Washing;  Surgeon: Particia Nearing, MD;  Location: MAIN OR [facility];  Service:  Transplant   ??? PR PLACE PERCUT GASTROSTOMY TUBE N/A 11/17/2016    Procedure: UGI ENDO; W/DIRECTED PLCMT PERQ GASTROSTOMY TUBE;  Surgeon: Cletis Athens, MD;  Location: [facility];  Service: Gastroenterology   ??? PR TRACHEOSTOMY, PLANNED N/A 09/29/2016    Procedure: TRACHEOSTOMY PLANNED (SEPART PROC);  Surgeon: Katherina Mires, MD;  Location: MAIN OR [facility];  Service: Trauma   ??? PR TRANSCATH INSERT OR REPLACE LEADLESS PM VENTR N/A 10/11/2016    Procedure: Pacemaker Implant/Replace Leadless;  Surgeon: Meredith Leeds, MD;  Location: [facility];  Service: Cardiology   ??? PR TRANSPLANT LIVER,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    Procedure: Liver Allotransplantation; Orthotopic, Partial Or Whole, From Cadaver Or Living Donor, Any Age; Surgeon: Doyce Loose, MD;  Location: MAIN OR [facility];  Service: Transplant   ??? PR TRANSPLANT,PREP DONOR LIVER, WHOLE N/A 09/15/2016    Procedure: Rogelia Boga Std Prep Cad Donor Whole Liver Gft Prior Tnsplnt,Inc Chole,Diss/Rem Surr Tissu Wo Triseg/Lobe Splt;  Surgeon: Doyce Loose, MD;  Location: MAIN OR [facility];  Service: Transplant   ??? PR UPPER GI ENDOSCOPY,BIOPSY N/A 07/17/2012    Procedure: UGI ENDOSCOPY; WITH BIOPSY, SINGLE OR MULTIPLE;  Surgeon: Alba Destine, MD;  Location: [facility];  Service: Gastroenterology   ??? PR UPPER GI ENDOSCOPY,DIAGNOSIS N/A 02/04/2014    Procedure: UGI ENDO, INCLUDE ESOPHAGUS, STOMACH, & DUODENUM &/OR JEJUNUM; DX W/WO COLLECTION SPECIMN, BY BRUSH OR WASH;  Surgeon: Wilburt Finlay, MD;  Location: [facility];  Service: Gastroenterology   ??? PR UPPER GI ENDOSCOPY,LIGAT VARIX N/A 11/05/2013    Procedure: UGI ENDO; W/BAND LIG ESOPH &/OR GASTRIC VARICES;  Surgeon: Wilburt Finlay, MD;  Location: [facility]URES [facility];  Service: Gastroenterology       Medications:    Current Outpatient Medications:   ???  acetaminophen (TYLENOL) 325 MG tablet, Take 1 tablet (325 mg total) by mouth every four (4) hours as needed for pain., Disp: 100 tablet, Rfl: 2  ???  blood sugar diagnostic (FREESTYLE TEST) Strp, by Other route Four (4) times a day (before meals and nightly)., Disp: 100 each, Rfl: 11  ???  blood-glucose meter (GLUCOSE MONITORING KIT) kit, Use as instructed, Disp: 1 each, Rfl: 0  ???  cholecalciferol, vitamin D3, 2,000 unit cap, 1 capsule (2,000 Units total) by PEG Tube route daily. (Patient taking differently: Take 1 capsule by mouth daily.), Disp: 30 capsule, Rfl: 11  ???  digoxin (LANOXIN) 125 mcg tablet, 0.5 tablets (62.5 mcg total) by G-tube route daily. (Patient taking differently: Take 0.5 tablets by mouth daily.), Disp: 15 tablet, Rfl: 11  ???  insulin ASPART (NOVOLOG FLEXPEN U-100 INSULIN) 100 unit/mL injection pen, Inject 0.06 mL (6 Units total) under the skin Three (3) times a day before meals. (Patient taking differently: Inject 6 Units under the skin Three (3) times a day before meals. PLUS CORRECTIONS (Additional mealtime per SSI-  151-200= 2 units,  201-250= 4 units, 251-300=add 6 units, 301-350=8 units, 351-400=10 units, >400 =12 units)), Disp: 4 mL, Rfl: 11  ???  insulin glargine (LANTUS) 100 unit/mL (3 mL) injection pen, Inject 0.12 mL (12 Units total) under the skin nightly., Disp: 4 mL, Rfl: 11  ???  lancets Misc, 1 each by Miscellaneous route Four (4) times a day (before meals and nightly)., Disp: 100 each, Rfl: 11  ???  magnesium oxide-Mg AA chelate (MAGNESIUM, AMINO ACID CHELATE,) 133 mg Tab, Take 2 tablets by mouth Two (2) times a day., Disp: 100  tablet, Rfl: PRN  ???  melatonin 3 mg Tab, Take 1 tablet (3 mg total) by mouth nightly., Disp: 30 tablet, Rfl: 0  ???  mycophenolate (CELLCEPT) 250 mg capsule, Take 2 capsules (500 mg total) by mouth Two (2) times a day., Disp: 360 capsule, Rfl: 3  ???  NEORAL 100 mg capsule, 175 mg BID (three 25 mg capsules and one 100 mg capsule two times a day), Disp: 60 capsule, Rfl: 11  ???  NEORAL 25 mg capsule, 175 mg BID (three 25 mg capsules and one 100 mg capsule two times a day), Disp: 180 capsule, Rfl: 11  ???  omeprazole (PRILOSEC) 20 MG capsule, Take 1 capsule (20 mg total) by mouth daily., Disp: 30 capsule, Rfl: 1  ???  pen needle, diabetic (ULTICARE PEN NEEDLE) 32 gauge x 5/32 Ndle, 1 each by Miscellaneous route Four (4) times a day (before meals and nightly)., Disp: 90 each, Rfl: 3  ???  sildenafil, antihypertensive, (REVATIO) 20 mg tablet, Take 40 mg by mouth Three (3) times a day., Disp: , Rfl:   ???  traMADol (ULTRAM) 50 mg tablet, Take 1 tablet (50 mg total) by mouth every six (6) hours as needed for pain., Disp: 30 tablet, Rfl: 0  ???  warfarin (COUMADIN) 3 MG tablet, 4.5 mg M/W/F (1.5 pills) and 3 mg all other days (one pill), Disp: 35 tablet, Rfl: 2  ???  mirtazapine (REMERON) 15 MG tablet, Take 1 tablet (15 mg total) by mouth nightly., Disp: 90 tablet, Rfl: 3     Allergies:  No Known Allergies    Family History:  Family History   Problem Relation Age of Onset   ??? Hypertension Mother    ??? Cirrhosis Neg Hx    ??? Liver cancer Neg Hx    ??? Anemia Neg Hx    ??? Cancer Neg Hx    ??? Diabetes Neg Hx    ??? Kidney disease Neg Hx    ??? Obesity Neg Hx    ??? Thyroid disease Neg Hx    ??? Osteoporosis Neg Hx    ??? Coronary artery disease Neg Hx    ??? Anesthesia problems Neg Hx    ??? Basal cell carcinoma Neg Hx    ??? Squamous cell carcinoma Neg Hx      Social History:  Social History     Tobacco Use   ??? Smoking status: Never Smoker   ??? Smokeless tobacco: Never Used   ??? Tobacco comment: Smoked in high school for about 5 years.    Substance Use Topics   ??? Alcohol use: No   ??? Drug use: No       Review of Systems:  Denies chest pain, sob, fevers, n/v, abd pain      Physical Exam:   Constitutional:  Vitals reviewed in chart, patient has normal appearance. Well nourished, well-developed, no acute distress  Voice:  Hoarse, breathy, weak voice  Respiration:  Breathing comfortably, no stridor.  CV: No clubbing/cyanosis/edema in hands.   Eyes:  extraocular motion intact, sclera normal.   Neuro:  Alert and oriented times 3, Cranial nerves 2-12 intact and symmetric bilaterally. Except X.  Head and Face:  Skin with no masses or lesions, sinuses nontender to palpation, facial nerve fully intact.   Salivary Glands:  Parotid and submandibular glands normal bilaterally.   Ears:  Normal external ears  Nose:  External nose midline, anterior rhinoscopy is normal with limited visualization just to the anterior interior turbinate.  Oral Cavity/Oropharynx/Lips:  Normal mucous membranes, normal floor of mouth/tongue/oropharynx, no masses or lesions are noted.  Good dentition  Pharyngeal Walls:  No masses noted.  Neck/Lymph:  No lymphadenopathy, no thyroid masses. Previous tracheostoma still patent with moderate granulation tissue filling the fistula. No erythema or drainage or purulence noted.   Larynx: Mirror evaluation insufficient to visualize secondary to prominent gag        Procedure Note    Endoscopy Type:  Flexible Fiberoptic Videostroboscopy    Indications/TimeOut:  To better evaluate the patient???s symptoms, fiberoptic videostroboscopy is indicated.   A time out identifying the patient, the procedure, the location of the procedure and any concerns was performed prior to beginning the procedure.    Procedure Details:    The patient was placed in the sitting position.  After topical anesthesia and decongestion with oxymetazoline and 1% lidocaine, the flexible laryngoscope was passed.  The microphone was used to trigger the xenon stroboscopic light source.    -  Nasal cavity  - There was no pus or polyps noted in the nasal cavity. Septum intact  -  Nasopharynx - there was no evidence of mass or lesion.  -  Hypopharynx - There were no lesions in the pyriformis, epiglottis, or base of tongue.  -  Larynx -  there was mild interarytenoid edema, no erythema.  -  Vocal Folds -     Mobility: Right vocal fold with some recovery of motion and hypomobile   Amplitude: Unable to fully assess secondary to functional muscle tension   Mucosal Wave: Unable to fully assess secondary to functional muscle tension   Closure: Unable to fully assess secondary to functional muscle tension   Lesions/Other findings: Evidence of continued augmentation to the right vocal fold    Condition:  Stable.  Patient tolerated procedure well.    Complications:  None    FEES performed today: Normal oropharyngeal swallow.  No coughing choking regurgitation or backflow.    VRQOL: 20  GFI: 17  EAT 10: 19    MBS: 08/29/17:   Assessment: Pt presents with a mild pharyngeal dysphagia c/b reduced BOT retraction which led to mild vallecula residue (all consistencies). Trace post-cricoid residue of solids. Trace penetration x 1 of thin liquids before swallows (cleared). No aspiration observed. Cervical esophagus: mild accumulation of boluses at tissue protrusion (~C6) c/w the appearance of a small Zenker's diverticulum. Occasional mild backflow of boluses from this area.     Assessment:   [name] is a 67 y.o. 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  He  has a past medical history of Atrial fibrillation (CMS-HCC), Basal cell carcinoma, Cancer (CMS-HCC), Cirrhosis (CMS-HCC), Diabetes (CMS-HCC), Hepatitis C (07/17/2012), Liver disease, Low back pain (07/17/2012), and Varices, esophageal (CMS-HCC).    Findings:   dypshonia  right vocal fold immobility   Glottic insufficiency   --all temporally related to liver transplant transplant. (prolonged intubation, trach possible etiologies)   05/31/17 Status post in office injection augmentation with Cymetra in the [facility]   Significantly complicated history surrounding injection including hemothorax and anemia prior to injection followed by readmission x2 for fall rib pain and intubation for ERCP.  S/p [facility] on 09/13/17 for in office [facility] injection TTH cymetra 0.19ml.     Plan:  He has had some recovery motion of the right vocal fold.  He also has continued significant augmentation.  He  was evaluated by speech and was pathologist today and there is evidence of some functional component and he is stimulable for voice therapy.  I think he has poor compensation given his deconditioning significant medical problems this year and long-standing voice problem.  We will attempt to rehab this with therapy.    Regarding his dysphagia we discussed that his oropharyngeal swallow was normal today.  It is unclear if the coughing he is having is really coming from this very small Zenker's diverticulum.  We saw no coughing today on his swallow exam in office.  We recommended modifications or compensatory strategies and the way he eats.  We recommended small bites and small sips, he needs to eat dry foods with water.  He understands this we will continue to monitor his symptoms.  I think he may be at some risk for Zenker diverticulum repair given his history of esophageal varices and significant medical comorbidities, therefore I would like to know that it is warranted and will significantly improve his symptoms prior to proceeding.    3 months for reassessment

## 2017-10-03 NOTE — Unmapped (Signed)
[facility] SPEECH [address]  OUTPATIENT SPEECH PATHOLOGY  10/03/2017      Patient Name: Jeffrey Ward  Date of Birth:07-23-1950  Session Number: 1  Diagnosis:   Encounter Diagnoses   Name Primary?   ??? Dysphonia    ??? Dysphagia, unspecified type Yes      Date of Evaluation: 10/03/17  Date of Symptom Onset: 09/15/16  Referred by: R. Sherryll Burger, MD  Reason for Referral: Evaluation Speech, Language, Voice, Cognition      Dates of Certification: expires 01/03/2018    Chief Complaint: dysphagia, dysphonia    ASSESSMENT:  severe dysphonia    Characterized by: tight, hoarseness, pharyngeal focus, roughness, decreased loudness, strained voice, strangled voice, phonation breaks, breathiness  Therapy Techniques utilized during trial/diagnostic therapy:: Pitch glides up and down, Lip/tongue trills, Resonant voice therapy  Comments: Patient demonstrated most promise for improved vocal function with voiced-voiceless minimal pairs    Stimulability: Pt was somewhat stimulable     Treatment Recommendations: Initiate Treatment       PLAN:  SLP Follow-up / Frequency: 1x week for Planned Treatment Duration : 8 sessions    Planned Interventions: Conversational Coaching, Diaphragmatic breathing/respiratory retraining, Resonant Voice Therapy, Vocal Function Exercises       Prognosis:  Good    Negative Prognosis Rationale: Time post onset, Severity of deficits       Positive Prognosis Rationale: Motivation, Good caregiver/family support      Goals:  Patient and Family Goals: to clear up vocal quality and have an easier time with projection     STG 1: Breathing: Pt. will demonstrate abdominal breathing for support of phonation with 80% accuracy given minimal cues    Time Frame: 3  Duration: months    STG 2: Resonance: Pt. will balance oral/nasal resonance to maximize vocal output with minimal effort/laryngeal hyperfunction with 80% accuracy given minimal cues.        Time Frame: 3    Duration: months    STG 3: Strength/balance: Pt. will strengthen and balance laryngeal musculature for support of connected speech with 80% accuracy given minimal cues.        Time Frame: 3   Duration: months    STG 4: Inflection: Pt. will increase inflection, improve phonatory flexibility, and increase laryngeal control without laryngeal hyperfunction with 80% accuracy given minimal cues        Time Frame: 3   Duration: months      LTG #1: SLP's LTG: At the completion of Tx, Pt. will demonstrate ability to perform voice therapy techniques independently to maintain optimal laryngeal function (+/-).        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 He spent 3 months in the hospital following his transplant where he had respiratory compromise and ultimately had tracheostomy tube for much of that stay. He was decannulated in September before discharge home. 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  Patient reports he lost his voice following liver  transplant. Post-surgery his voice was breathy and weak. He could not control his pitch, and his voice symptoms did not fluctuate throughout the day. He was unable to identify ameliorating or exacerbating factors. He was noted to have a right vocal fold paralysis with significant breathy voice and glottal insufficiency. He underwent in office injection cymetra on 05/31/17 0.30ml of the right fold. Since then, he has had multiple set-backs in his GI care. He had a fall and possible liver abscess. He underwent intubation and ERCP in April. He has been admitted to the hospital twice. He was taken back to [facility] on 09/13/17 for in office [facility] injection TTH cymetra 0.55ml. He returns today with reports that his voice improved but by only about 50%. He also complains of coughing while eating. This has not changed with injection augmentation. He does state he is louder and easier, but still weak from voice standpoint. He is seen today in consultation at the request of Martina Sinner, MD for the evaluation of dysphagia and dysphonia.     Communication Preference: Verbal         Barriers to Learning: No Barriers                                                     Prior treatment for referral reason: No                  Pain?: No      Precautions: Aspiration(General)         Prior Function: Independent(uses a cane for ambulation)                                   Lives With: Spouse    Past Medical History:   Diagnosis Date   ??? Atrial fibrillation (CMS-HCC)    ??? Basal cell carcinoma    ??? Cancer (CMS-HCC)    ??? Cirrhosis (CMS-HCC)    ??? Diabetes (CMS-HCC)    ??? Hepatitis C 07/17/2012   ??? Liver disease    ??? Low back pain 07/17/2012   ??? Varices, esophageal (CMS-HCC)       Family History   Problem Relation Age of Onset   ??? Hypertension Mother    ??? Cirrhosis Neg Hx    ??? Liver cancer Neg Hx    ??? Anemia Neg Hx    ??? Cancer Neg Hx    ??? Diabetes Neg Hx    ??? Kidney disease Neg Hx    ??? Obesity Neg Hx    ??? Thyroid disease Neg Hx    ??? Osteoporosis Neg Hx    ??? Coronary artery disease Neg Hx    ??? Anesthesia problems Neg Hx    ??? Basal cell carcinoma Neg Hx    ??? Squamous cell carcinoma Neg Hx      Past Surgical History:   Procedure Laterality Date   ??? ANKLE SURGERY     ??? BACK SURGERY     ??? BACK SURGERY     ??? CARDIAC SURGERY      pacemaker   ??? CHG US GUIDE, TISSUE ABLATION N/A 01/22/2016    Procedure: ULTRASOUND GUIDANCE FOR, AND MONITORING OF, PARENCHYMAL TISSUE ABLATION;  Surgeon: Particia Nearing, MD;  Location: MAIN OR [facility];  Service:  Transplant   ??? IR EMBOLIZATION ORGAN ISCHEMIA, TUMORS, INFAR  06/16/2016    IR EMBOLIZATION ORGAN ISCHEMIA, TUMORS, INFAR 06/16/2016 Ammie Dalton, MD IMG VIR [facility]   ??? PR COLON CA SCRN NOT HI RSK IND N/A 02/27/2015    Procedure: COLOREC CNCR SCR;COLNSCPY NO;  Surgeon: Vonda Antigua, MD;  Location: [facility];  Service: Gastroenterology   ??? PR ENDOSCOPIC ULTRASOUND EXAM N/A 02/27/2015    Procedure: UGI ENDO; W/ENDO ULTRASOUND EXAM INCLUDES ESOPHAGUS, STOMACH, &/OR DUODENUM/JEJUNUM;  Surgeon: Vonda Antigua, MD;  Location: [facility];  Service: Gastroenterology   ??? PR ERCP BALLOON DILATE BILIARY/PANC DUCT/AMPULLA EA N/A 02/10/2017    Procedure: ERCP;WITH TRANS-ENDOSCOPIC BALLOON DILATION OF BILIARY/PANCREATIC DUCT(S) OR OF AMPULLA, INCLUDING SPHINCTERECTOMY, WHEN PERFOREMD,EACH DUCT (16109);  Surgeon: Mayford Knife, MD;  Location: [facility]URES [facility];  Service: Gastroenterology   ??? PR ERCP REMOVE FOREIGN BODY/STENT BILIARY/PANC DUCT N/A 02/10/2017    Procedure: ENDOSCOPIC RETROGRADE CHOLANGIOPANCREATOGRAPHY (ERCP); W/ REMOVAL OF FOREIGN BODY/STENT FROM BILIARY/PANCREATIC DUCT(S);  Surgeon: Mayford Knife, MD;  Location: GI PROCEDURES [facility];  Service: Gastroenterology   ??? PR ERCP REMOVE FOREIGN BODY/STENT BILIARY/PANC DUCT N/A 06/29/2017    Procedure: ENDOSCOPIC RETROGRADE CHOLANGIOPANCREATOGRAPHY (ERCP); W/ REMOVAL OF FOREIGN BODY/STENT FROM BILIARY/PANCREATIC DUCT(S);  Surgeon: Vonda Antigua, MD;  Location: [facility] PROC[facility]URES [facility];  Service: Gastroenterology   ??? PR ERCP STENT PLACEMENT BILIARY/PANCREATIC DUCT N/A 11/29/2016    Procedure: ENDOSCOPIC RETROGRADE CHOLANGIOPANCREATOGRAPHY (ERCP); WITH PLACEMENT OF ENDOSCOPIC STENT INTO BILIARY OR PANCREATIC DUCT;  Surgeon: Chriss Driver, MD;  Location: GI PROCEDURES [facility];  Service: Gastroenterology   ??? PR ERCP STENT PLACEMENT BILIARY/PANCREATIC DUCT N/A 04/26/2017    Procedure: ENDOSCOPIC RETROGRADE CHOLANGIOPANCREATOGRAPHY (ERCP); WITH PLACEMENT OF ENDOSCOPIC STENT INTO BILIARY OR PANCREATIC DUCT;  Surgeon: Mayford Knife, MD;  Location: GI PROC[facility]URES [facility];  Service: Gastroenterology   ??? PR ERCP,W/REMOVAL STONE,BIL/PANCR DUCTS N/A 11/29/2016    Procedure: ERCP; W/ENDOSCOPIC RETROGRADE REMOVAL OF CALCULUS/CALCULI FROM BILIARY &/OR PANCREATIC DUCTS;  Surgeon: Chriss Driver, MD;  Location: GI PROC[facility]URES [facility];  Service: Gastroenterology   ??? PR ERCP,W/REMOVAL STONE,BIL/PANCR DUCTS N/A 02/10/2017    Procedure: ERCP; W/ENDOSCOPIC RETROGRADE REMOVAL OF CALCULUS/CALCULI FROM BILIARY &/OR PANCREATIC DUCTS;  Surgeon: Mayford Knife, MD;  Location: GI PROC[facility]URES [facility];  Service: Gastroenterology   ??? PR ERCP,W/REMOVAL STONE,BIL/PANCR DUCTS N/A 04/26/2017    Procedure: ERCP; W/ENDOSCOPIC RETROGRADE REMOVAL OF CALCULUS/CALCULI FROM BILIARY &/OR PANCREATIC DUCTS;  Surgeon: Mayford Knife, MD;  Location: GI PROCEDURES [facility];  Service: Gastroenterology   ??? PR ERCP,W/REMOVAL STONE,BIL/PANCR DUCTS N/A 06/29/2017    Procedure: ERCP; W/ENDOSCOPIC RETROGRADE REMOVAL OF CALCULUS/CALCULI FROM BILIARY &/OR PANCREATIC DUCTS;  Surgeon: Vonda Antigua, MD;  Location: GI PROC[facility]URES [facility];  Service: Gastroenterology   ??? PR INSER HEART TEMP PACER ONE CHMBR N/A 10/02/2016    Procedure: Tempoarary Pacemaker Insertion;  Surgeon: Meredith Leeds, MD;  Location: [facility];  Service: Cardiology   ??? PR LAP,DIAGNOSTIC ABDOMEN N/A 01/22/2016    Procedure: Laparoscopy, Abdomen, Peritoneum, & Omentum, Diagnostic, W/Wo Collection Specimen(S) By Brushing Or Washing;  Surgeon: Particia Nearing, MD;  Location: MAIN OR [facility];  Service: Transplant   ??? PR PLACE PERCUT GASTROSTOMY TUBE N/A 11/17/2016    Procedure: UGI ENDO; W/DIRECTED PLCMT PERQ GASTROSTOMY TUBE;  Surgeon: Cletis Athens, MD;  Location: [facility];  Service: Gastroenterology   ??? PR TRACHEOSTOMY, PLANNED N/A 09/29/2016    Procedure: TRACHEOSTOMY  PLANNED (SEPART PROC);  Surgeon: Katherina Mires, MD;  Location: MAIN OR [facility];  Service: Trauma   ??? PR TRANSCATH INSERT OR REPLACE LEADLESS PM VENTR N/A 10/11/2016    Procedure: Pacemaker Implant/Replace Leadless;  Surgeon: Meredith Leeds, MD;  Location: [facility];  Service: Cardiology   ??? PR TRANSPLANT LIVER,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    Procedure: Liver Allotransplantation; Orthotopic, Partial Or Whole, From Cadaver Or Living Donor, Any Age;  Surgeon: Doyce Loose, MD;  Location: MAIN OR [facility];  Service: Transplant   ??? PR TRANSPLANT,PREP DONOR LIVER, WHOLE N/A 09/15/2016    Procedure: Rogelia Boga Std Prep Cad Donor Whole Liver Gft Prior Tnsplnt,Inc Chole,Diss/Rem Surr Tissu Wo Triseg/Lobe Splt;  Surgeon: Doyce Loose, MD;  Location: MAIN OR [facility];  Service: Transplant   ??? PR UPPER GI ENDOSCOPY,BIOPSY N/A 07/17/2012    Procedure: UGI ENDOSCOPY; WITH BIOPSY, SINGLE OR MULTIPLE;  Surgeon: Alba Destine, MD;  Location: GI PROC[facility]URES [facility];  Service: Gastroenterology   ??? PR UPPER GI ENDOSCOPY,DIAGNOSIS N/A 02/04/2014    Procedure: UGI ENDO, INCLUDE ESOPHAGUS, STOMACH, & DUODENUM &/OR JEJUNUM; DX W/WO COLLECTION SPECIMN, BY BRUSH OR WASH;  Surgeon: Wilburt Finlay, MD;  Location: GI PROCEDURES [facility];  Service: Gastroenterology   ??? PR UPPER GI ENDOSCOPY,LIGAT VARIX N/A 11/05/2013    Procedure: UGI ENDO; W/BAND LIG ESOPH &/OR GASTRIC VARICES;  Surgeon: Wilburt Finlay, MD;  Location: GI PROC[facility]URES [facility];  Service: Gastroenterology    No Known Allergies  Social History     Tobacco Use   ??? Smoking status: Never Smoker   ??? Smokeless tobacco: Never Used   ??? Tobacco comment: Smoked in high school for about 5 years.    Substance Use Topics   ??? Alcohol use: No      Current Outpatient Medications   Medication Sig Dispense Refill   ??? acetaminophen (TYLENOL) 325 MG tablet Take 1 tablet (325 mg total) by mouth every four (4) hours as needed for pain. 100 tablet 2   ??? blood sugar diagnostic (FREESTYLE TEST) Strp by Other route Four (4) times a day (before meals and nightly). 100 each 11   ??? blood-glucose meter (GLUCOSE MONITORING KIT) kit Use as instructed 1 each 0   ??? cholecalciferol, vitamin D3, 2,000 unit cap 1 capsule (2,000 Units total) by PEG Tube route daily. (Patient taking differently: Take 1 capsule by mouth daily.) 30 capsule 11   ??? digoxin (LANOXIN) 125 mcg tablet 0.5 tablets (62.5 mcg total) by G-tube route daily. (Patient taking differently: Take 0.5 tablets by mouth daily.) 15 tablet 11   ??? insulin ASPART (NOVOLOG FLEXPEN U-100 INSULIN) 100 unit/mL injection pen Inject 0.06 mL (6 Units total) under the skin Three (3) times a day before meals. (Patient taking differently: Inject 6 Units under the skin Three (3) times a day before meals. PLUS CORRECTIONS (Additional mealtime per SSI-  151-200= 2 units,  201-250= 4 units, 251-300=add 6 units, 301-350=8 units, 351-400=10 units, >400 =12 units)) 4 mL 11   ??? insulin glargine (LANTUS) 100 unit/mL (3 mL) injection pen Inject 0.12 mL (12 Units total) under the skin nightly. 4 mL 11   ??? lancets Misc 1 each by Miscellaneous route Four (4) times a day (before meals and nightly). 100 each 11   ??? magnesium oxide-Mg AA chelate (MAGNESIUM, AMINO ACID CHELATE,) 133 mg Tab Take 2 tablets by mouth Two (2) times a day. 100 tablet PRN   ??? melatonin 3 mg Tab Take 1 tablet (  3 mg total) by mouth nightly. 30 tablet 0   ??? mirtazapine (REMERON) 15 MG tablet Take 1 tablet (15 mg total) by mouth nightly. 90 tablet 3   ??? mycophenolate (CELLCEPT) 250 mg capsule Take 2 capsules (500 mg total) by mouth Two (2) times a day. 360 capsule 3   ??? NEORAL 100 mg capsule 175 mg BID (three 25 mg capsules and one 100 mg capsule two times a day) 60 capsule 11   ??? NEORAL 25 mg capsule 175 mg BID (three 25 mg capsules and one 100 mg capsule two times a day) 180 capsule 11   ??? omeprazole (PRILOSEC) 20 MG capsule Take 1 capsule (20 mg total) by mouth daily. 30 capsule 1   ??? pen needle, diabetic (ULTICARE PEN NEEDLE) 32 gauge x 5/32 Ndle 1 each by Miscellaneous route Four (4) times a day (before meals and nightly). 90 each 3   ??? sildenafil, antihypertensive, (REVATIO) 20 mg tablet Take 40 mg by mouth Three (3) times a day.     ??? traMADol (ULTRAM) 50 mg tablet Take 1 tablet (50 mg total) by mouth every six (6) hours as needed for pain. 30 tablet 0   ??? warfarin (COUMADIN) 3 MG tablet 4.5 mg M/W/F (1.5 pills) and 3 mg all other days (one pill) 35 tablet 2     No current facility-administered medications for this visit.      OBJECTIVE  Voice - General   Hydration: 6-7 bottles of water per day  Caffeine Use: occassional coffee  Onset: Sudden  Daily Voice Use: Moderate demand   Acoustic Measures  Connected Sample: CAPE-V Sentences  Connected Sample 1 (Hz): 157.6 Hz   Total Fundamental Frequency  Total Fundamental Frequency Range (Hz): 146 Hz  to (Hz): 296 Hz   Sustained Phonation  Mean Fund Freq (Hz): 193.09 Hz  Relative Average Perturbation (RAP) %: 2.51 %  Noise-to-Harmonic Ratio (NHR): 0.35  Degree of Subharmonics (DSH) %: 17.39 %  Degree of Voiceless (DUV) %: 13.21 %  Soft Phonation Index (SPI) : 10.71  Pt has decrease in following acoustic measures:: Total Fo range, Decreased in upper range   Aerodynamic Measures  Maximum phonation time on /a/ in sec:: 4 sec  Mean Flow Rate (mL/sec): 620 mL/sec  at (Hz): 172.4 Hz  and (dB): 81.1 dB  Objective and/or perceptual measures suggest:: Weak voice, Hyperfunctional pattern  Pulmonary Breath Support: SOB  Primary Breath Support: Thoracic, Diaphragmatic  Speaks on functional residual capacity?: No       Perceptual Measures  Grade:: 3  Breathy:: 2  Strained:: 2  Rough:: 2  Asthenic:: 2  Total:: 11 /15   Tremor / Rate of Speech  Tremor:: Absent  Rate of Speech:: WFL   Subjective Measures  Glottal Function Index (GFI):: 17  Voice-related Quality of Life (V-RQOL):: 20    Session Duration : 30    Today's Charges (noted here with $$):     SLP Evaluations  $$ Voice Quality Evaluation [mins]: 15 $$ Laryngeal Function Study - Voice Ctr only [mins]: 15       I attest that I have reviewed the above information.  Signed: Patricia Nettle, SLP  10/03/2017 2:18 PM

## 2017-10-03 NOTE — Unmapped (Signed)
Confirmed with wife that patient is to have repeat labs tomorrow to recheck his INR.

## 2017-10-03 NOTE — Unmapped (Signed)
[facility] SPEECH Jeffrey Ward  OUTPATIENT SPEECH PATHOLOGY  10/03/2017      Patient Name: Jeffrey Ward  Date of Birth:28-May-1950  Session Number: 1  Diagnosis:   Encounter Diagnoses   Name Primary?   ??? Dysphagia, unspecified type Yes   ??? Dysphonia       Date of Evaluation: 10/03/17  Date of Symptom Onset: 09/15/16  Referred by: R. Sherryll Burger, MD  Reason for Referral: Evaluation Speech, Language, Voice, Cognition      Chief Complaint: dysphagia, dysphonia    Assessment: Pt presents with a mild pharyngeal dysphagia c/b mildly reduced BOT retraction and mild reduced pharyngeal constriction. This was evidenced by minimal-mild vallecular and lateral channel residue with puree and solid cracker trials. Residue cleared with subsequent volitional swallows. No penetration or aspiration observed on today's exam. Consistencies tested: thin liquid, puree, cracker solid.  Dysphagia Diagnosis: Within Functional Limits, Mild pharyngeal stage dysphagia    PO Recommendations : Regular Solids (no restrictions), Thin liquids    Recommended Compensatory Techniques : Small sips/bites, Alternate liquids and solids, Upright 90 degrees, Slow rate     Treatment Recommendations: N/A       Prognosis:  Good    Negative Prognosis Rationale: Age, Severity of deficits       Positive Prognosis Rationale: symptom severity      Goals:  Patient and Family Goals: to clear up vocal quality and have an easier time with projection     LTG #1: Pt will tolerate least restrictive PO diet without overt s/sx aspiration.       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 He spent 3 months in the hospital following his transplant where he had respiratory compromise and ultimately had tracheostomy tube for much of that stay. He was decannulated in September before discharge home. 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  Patient reports he lost his voice following liver transplant. Post-surgery his voice was breathy and weak. He could not control his pitch, and his voice symptoms did not fluctuate throughout the day. He was unable to identify ameliorating or exacerbating factors. He was noted to have a right vocal fold paralysis with significant breathy voice and glottal insufficiency. He underwent in office injection cymetra on 05/31/17 0.31ml of the right fold. Since then, he has had multiple set-backs in his GI care. He had a fall and possible liver abscess. He underwent intubation and ERCP in April. He has been admitted to the hospital twice. He was taken back to [facility] on 09/13/17 for in office [facility] injection TTH cymetra 0.5ml. He returns today with reports that his voice improved but by only about 50%. He also complains of coughing while eating. This has not changed with injection augmentation. He does state he is louder and easier, but still weak from voice standpoint. He is seen today in consultation at the request of Martina Sinner, MD for the evaluation of dysphagia and dysphonia.     Communication Preference: Verbal    Barriers to Learning: No Barriers                   Prior treatment for referral reason: No    Pain?: No   Precautions: Aspiration(General)  Prior Function: Independent(uses a cane for ambulation)      Past Medical History:   Diagnosis Date   ??? Atrial fibrillation (CMS-HCC)    ??? Basal cell carcinoma    ??? Cancer (CMS-HCC)    ??? Cirrhosis (CMS-HCC)    ??? Diabetes (CMS-HCC)    ??? Hepatitis C 07/17/2012   ??? Liver disease    ??? Low back pain 07/17/2012   ??? Varices, esophageal (CMS-HCC)       Family History   Problem Relation Age of Onset   ??? Hypertension Mother    ??? Cirrhosis Neg Hx    ??? Liver cancer Neg Hx    ??? Anemia Neg Hx    ??? Cancer Neg Hx    ??? Diabetes Neg Hx ??? Kidney disease Neg Hx    ??? Obesity Neg Hx    ??? Thyroid disease Neg Hx    ??? Osteoporosis Neg Hx    ??? Coronary artery disease Neg Hx    ??? Anesthesia problems Neg Hx    ??? Basal cell carcinoma Neg Hx    ??? Squamous cell carcinoma Neg Hx      Past Surgical History:   Procedure Laterality Date   ??? ANKLE SURGERY     ??? BACK SURGERY     ??? BACK SURGERY     ??? CARDIAC SURGERY      pacemaker   ??? CHG US GUIDE, TISSUE ABLATION N/A 01/22/2016    Procedure: ULTRASOUND GUIDANCE FOR, AND MONITORING OF, PARENCHYMAL TISSUE ABLATION;  Surgeon: Particia Nearing, MD;  Location: MAIN OR [facility];  Service: Transplant   ??? IR EMBOLIZATION ORGAN ISCHEMIA, TUMORS, INFAR  06/16/2016    IR EMBOLIZATION ORGAN ISCHEMIA, TUMORS, INFAR 06/16/2016 Ammie Dalton, MD IMG VIR H&V [facility]   ??? PR COLON CA SCRN NOT HI RSK IND N/A 02/27/2015    Procedure: COLOREC CNCR SCR;COLNSCPY NO;  Surgeon: Vonda Antigua, MD;  Location: GI PROCEDURES MEMORIAL [facility];  Service: Gastroenterology   ??? PR ENDOSCOPIC ULTRASOUND EXAM N/A 02/27/2015    Procedure: UGI ENDO; W/ENDO ULTRASOUND EXAM INCLUDES ESOPHAGUS, STOMACH, &/OR DUODENUM/JEJUNUM;  Surgeon: Vonda Antigua, MD;  Location: GI PROCEDURES MEMORIAL [facility];  Service: Gastroenterology   ??? PR ERCP BALLOON DILATE BILIARY/PANC DUCT/AMPULLA EA N/A 02/10/2017    Procedure: ERCP;WITH TRANS-ENDOSCOPIC BALLOON DILATION OF BILIARY/PANCREATIC DUCT(S) OR OF AMPULLA, INCLUDING SPHINCTERECTOMY, WHEN PERFOREMD,EACH DUCT (16109);  Surgeon: Mayford Knife, MD;  Location: GI PROCEDURES [facility]IAL [facility];  Service: Gastroenterology   ??? PR ERCP REMOVE FOREIGN BODY/STENT BILIARY/PANC DUCT N/A 02/10/2017    Procedure: ENDOSCOPIC RETROGRADE CHOLANGIOPANCREATOGRAPHY (ERCP); W/ REMOVAL OF FOREIGN BODY/STENT FROM BILIARY/PANCREATIC DUCT(S);  Surgeon: Mayford Knife, MD;  Location: GI PROCEDURES MEMORIAL [facility];  Service: Gastroenterology   ??? PR ERCP REMOVE FOREIGN BODY/STENT BILIARY/PANC DUCT N/A 06/29/2017    Procedure: ENDOSCOPIC RETROGRADE CHOLANGIOPANCREATOGRAPHY (ERCP); W/ REMOVAL OF FOREIGN BODY/STENT FROM BILIARY/PANCREATIC DUCT(S);  Surgeon: Vonda Antigua, MD;  Location: GI PROCEDURES MEMORIAL [facility];  Service: Gastroenterology   ??? PR ERCP STENT PLACEMENT BILIARY/PANCREATIC DUCT N/A 11/29/2016    Procedure: ENDOSCOPIC RETROGRADE CHOLANGIOPANCREATOGRAPHY (ERCP); WITH PLACEMENT OF ENDOSCOPIC STENT INTO BILIARY OR PANCREATIC DUCT;  Surgeon: Chriss Driver, MD;  Location: GI PROCEDURES MEMORIAL [facility];  Service: Gastroenterology   ??? PR ERCP STENT PLACEMENT BILIARY/PANCREATIC DUCT N/A 04/26/2017    Procedure: ENDOSCOPIC RETROGRADE CHOLANGIOPANCREATOGRAPHY (ERCP); WITH PLACEMENT OF ENDOSCOPIC STENT INTO BILIARY OR PANCREATIC DUCT;  Surgeon: Mayford Knife, MD;  Location: GI PROCEDURES MEMORIAL [facility];  Service: Gastroenterology   ???  PR ERCP,W/REMOVAL STONE,BIL/PANCR DUCTS N/A 11/29/2016    Procedure: ERCP; W/ENDOSCOPIC RETROGRADE REMOVAL OF CALCULUS/CALCULI FROM BILIARY &/OR PANCREATIC DUCTS;  Surgeon: Chriss Driver, MD;  Location: GI PROCEDURES MEMORIAL [facility];  Service: Gastroenterology   ??? PR ERCP,W/REMOVAL STONE,BIL/PANCR DUCTS N/A 02/10/2017    Procedure: ERCP; W/ENDOSCOPIC RETROGRADE REMOVAL OF CALCULUS/CALCULI FROM BILIARY &/OR PANCREATIC DUCTS;  Surgeon: Mayford Knife, MD;  Location: GI PROCEDURES MEMORIAL [facility];  Service: Gastroenterology   ??? PR ERCP,W/REMOVAL STONE,BIL/PANCR DUCTS N/A 04/26/2017    Procedure: ERCP; W/ENDOSCOPIC RETROGRADE REMOVAL OF CALCULUS/CALCULI FROM BILIARY &/OR PANCREATIC DUCTS;  Surgeon: Mayford Knife, MD;  Location: GI PROCEDURES MEMORIAL [facility];  Service: Gastroenterology   ??? PR ERCP,W/REMOVAL STONE,BIL/PANCR DUCTS N/A 06/29/2017    Procedure: ERCP; W/ENDOSCOPIC RETROGRADE REMOVAL OF CALCULUS/CALCULI FROM BILIARY &/OR PANCREATIC DUCTS;  Surgeon: Vonda Antigua, MD;  Location: GI PROCEDURES MEMORIAL [facility];  Service: Gastroenterology   ??? PR INSER HEART TEMP PACER ONE CHMBR N/A 10/02/2016    Procedure: Tempoarary Pacemaker Insertion;  Surgeon: Meredith Leeds, MD;  Location: [facility];  Service: Cardiology   ??? PR LAP,DIAGNOSTIC ABDOMEN N/A 01/22/2016    Procedure: Laparoscopy, Abdomen, Peritoneum, & Omentum, Diagnostic, W/Wo Collection Specimen(S) By Brushing Or Washing;  Surgeon: Particia Nearing, MD;  Location: MAIN OR [facility];  Service: Transplant   ??? PR PLACE PERCUT GASTROSTOMY TUBE N/A 11/17/2016    Procedure: UGI ENDO; W/DIRECTED PLCMT PERQ GASTROSTOMY TUBE;  Surgeon: Cletis Athens, MD;  Location: GI PROCEDURES [facility]ORIAL [facility];  Service: Gastroenterology   ??? PR TRACHEOSTOMY, PLANNED N/A 09/29/2016    Procedure: TRACHEOSTOMY PLANNED (SEPART PROC);  Surgeon: Katherina Mires, MD;  Location: MAIN OR [facility];  Service: Trauma   ??? PR TRANSCATH INSERT OR REPLACE LEADLESS PM VENTR N/A 10/11/2016    Procedure: Pacemaker Implant/Replace Leadless;  Surgeon: Meredith Leeds, MD;  Location: [facility];  Service: Cardiology   ??? PR TRANSPLANT LIVER,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    Procedure: Liver Allotransplantation; Orthotopic, Partial Or Whole, From Cadaver Or Living Donor, Any Age;  Surgeon: Doyce Loose, MD;  Location: MAIN OR [facility];  Service: Transplant   ??? PR TRANSPLANT,PREP DONOR LIVER, WHOLE N/A 09/15/2016    Procedure: Rogelia Boga Std Prep Cad Donor Whole Liver Gft Prior Tnsplnt,Inc Chole,Diss/Rem Surr Tissu Wo Triseg/Lobe Splt;  Surgeon: Doyce Loose, MD;  Location: MAIN OR [facility];  Service: Transplant   ??? PR UPPER GI ENDOSCOPY,BIOPSY N/A 07/17/2012    Procedure: UGI ENDOSCOPY; WITH BIOPSY, SINGLE OR MULTIPLE;  Surgeon: Alba Destine, MD;  Location: GI PROCEDURES MEMORIAL [facility];  Service: Gastroenterology   ??? PR UPPER GI ENDOSCOPY,DIAGNOSIS N/A 02/04/2014    Procedure: UGI ENDO, INCLUDE ESOPHAGUS, STOMACH, & DUODENUM &/OR JEJUNUM; DX W/WO COLLECTION SPECIMN, BY BRUSH OR WASH;  Surgeon: Wilburt Finlay, MD;  Location: GI PROCEDURES MEMORIAL [facility];  Service: Gastroenterology   ??? PR UPPER GI ENDOSCOPY,LIGAT VARIX N/A 11/05/2013    Procedure: UGI ENDO; W/BAND LIG ESOPH &/OR GASTRIC VARICES;  Surgeon: Wilburt Finlay, MD;  Location: GI PROCEDURES [facility];  Service: Gastroenterology    No Known Allergies  Social History     Tobacco Use   ??? Smoking status: Never Smoker   ??? Smokeless tobacco: Never Used   ??? Tobacco comment: Smoked in high school for about 5 years.    Substance Use Topics   ??? Alcohol use: No      Current Outpatient Medications   Medication Sig Dispense Refill   ??? acetaminophen (TYLENOL) 325 MG tablet Take  1 tablet (325 mg total) by mouth every four (4) hours as needed for pain. 100 tablet 2   ??? blood sugar diagnostic (FREESTYLE TEST) Strp by Other route Four (4) times a day (before meals and nightly). 100 each 11   ??? blood-glucose meter (GLUCOSE MONITORING KIT) kit Use as instructed 1 each 0   ??? cholecalciferol, vitamin D3, 2,000 unit cap 1 capsule (2,000 Units total) by PEG Tube route daily. (Patient taking differently: Take 1 capsule by mouth daily.) 30 capsule 11   ??? digoxin (LANOXIN) 125 mcg tablet 0.5 tablets (62.5 mcg total) by G-tube route daily. (Patient taking differently: Take 0.5 tablets by mouth daily.) 15 tablet 11   ??? insulin ASPART (NOVOLOG FLEXPEN U-100 INSULIN) 100 unit/mL injection pen Inject 0.06 mL (6 Units total) under the skin Three (3) times a day before meals. (Patient taking differently: Inject 6 Units under the skin Three (3) times a day before meals. PLUS CORRECTIONS (Additional mealtime per SSI-  151-200= 2 units,  201-250= 4 units, 251-300=add 6 units, 301-350=8 units, 351-400=10 units, >400 =12 units)) 4 mL 11   ??? insulin glargine (LANTUS) 100 unit/mL (3 mL) injection pen Inject 0.12 mL (12 Units total) under the skin nightly. 4 mL 11   ??? lancets Misc 1 each by Miscellaneous route Four (4) times a day (before meals and nightly). 100 each 11   ??? magnesium oxide-Mg AA chelate (MAGNESIUM, AMINO ACID CHELATE,) 133 mg Tab Take 2 tablets by mouth Two (2) times a day. 100 tablet PRN   ??? melatonin 3 mg Tab Take 1 tablet (3 mg total) by mouth nightly. 30 tablet 0   ??? mirtazapine (REMERON) 15 MG tablet Take 1 tablet (15 mg total) by mouth nightly. 90 tablet 3   ??? mycophenolate (CELLCEPT) 250 mg capsule Take 2 capsules (500 mg total) by mouth Two (2) times a day. 360 capsule 3   ??? NEORAL 100 mg capsule 175 mg BID (three 25 mg capsules and one 100 mg capsule two times a day) 60 capsule 11   ??? NEORAL 25 mg capsule 175 mg BID (three 25 mg capsules and one 100 mg capsule two times a day) 180 capsule 11   ??? omeprazole (PRILOSEC) 20 MG capsule Take 1 capsule (20 mg total) by mouth daily. 30 capsule 1   ??? pen needle, diabetic (ULTICARE PEN NEEDLE) 32 gauge x 5/32 Ndle 1 each by Miscellaneous route Four (4) times a day (before meals and nightly). 90 each 3   ??? sildenafil, antihypertensive, (REVATIO) 20 mg tablet Take 40 mg by mouth Three (3) times a day.     ??? traMADol (ULTRAM) 50 mg tablet Take 1 tablet (50 mg total) by mouth every six (6) hours as needed for pain. 30 tablet 0   ??? warfarin (COUMADIN) 3 MG tablet 4.5 mg M/W/F (1.5 pills) and 3 mg all other days (one pill) 35 tablet 2     No current facility-administered medications for this visit.        Objective Swallow  Cognitive-Communication Status : WFL, dysphonia  Dysphagia History: MBSS from 08/29/2017 demonstrated a mild pharyngeal dysphagia c/b reduced BOT retraction which led to mild vallecula residue (all consistencies). Trace post-cricoid residue of solids. Trace penetration x 1 of thin liquids before swallows (cleared). No aspiration observed. Cervical esophagus: mild accumulation of boluses at tissue protrusion (~C6) c/w the appearance of a small Zenker's diverticulum. Occasional mild backflow of boluses from this area.   Diet Prior to this  Study: Regular diet, thin liquids  Details: difficulty with nuts, dry and dense meats  Respiratory Status : Room air  Positioning : Upright in chair    Presentation Methods Used During Study  Bolus Presentation : Cup Sip, Fed self    Thin Liquids   Oral Stage: WFL  Swallow Initiation : Swallow initiation after spillage over the epiglottis  Nasopharyngeal Reflux : None noted  Pharyngeal Stage : WFL, Tongue Base Retraction mildly impaired  Penetration Aspiration Score: 1 - Material does not enter airway  Aspiration Volume : None    Puree  Oral Stage : WFL  Swallow Initiation : WFL  Nasopharyngeal Reflux: None noted  Pharyngeal Stage : Residue after swallow, Residue cleared in subsequent swallows, Tongue Base Retraction mildly impaired, Pharyngeal Constriction mildly impaired  Penetration Aspiration Score: 1 - Material does not enter airway  Aspiration Volume : None    Solids   Oral Stage : WFL  Swallow Initiation : WFL  Nasopharyngeal Reflux: None noted  Pharyngeal Stage : Tongue Base Retraction mildly impaired, Pharyngeal Constriction mildly impaired, Residue after swallow, Residue cleared in subsequent swallows  Penetration Aspiration Score: 1 - Material does not enter airway  Aspiration Volume : None    Education Provided: Patient, Compensatory Swallowing Strategies    Education Understood: Understanding verbalized     Session Duration : 25    Today's Charges (noted here with $$):     SLP Evaluations  $$ SP Swallow Evaluation [mins]: 25    I attest that I have reviewed the above information.  Signed: Patricia Nettle, SLP  10/03/2017 2:13 PM

## 2017-10-03 NOTE — Unmapped (Signed)
Who can I contact?     Dr. Nira Retort. Sherryll Burger    Appointments-[phone] Option 1  Fax number [phone]    Nursing Line, [phone]    Maryann Alar - Surgery scheduler telephone [phone]     Triage Nurse [phone]    Emergencies: [phone], ask for ENT resident on call or 911 and go to your nearest emergency room.         [facility]- What is Voice Therapy?    Voice disorders may be managed in several different ways, including: medical management (further testing and medications), surgery, and voice therapy.  Surgery is usually considered when voice therapy will not help the particular voice disorder, or when improvement via voice therapy plateaus. Often, a combination of these treatments may be necessary.  Voice therapy is a patient- centered method of treatment aimed at modifying specific behaviors that contribute to voice disorders or voice misuse, or that in some way limit the functional, daily use of your voice.  It???s like physical therapy for your voice box.    Voice therapy has several components:  ? Applying principles of vocal hygiene to your daily voice use  ? Practicing therapeutic exercises designed to change the way your vocal folds vibrate  ? Developing vocal awareness training, called auditory training, to help you develop the ability to differentiate between your new and your old voice  ? Practicing your new voice in everyday speech     Who is a candidate?  Most of [place] depend on our voices to make a living.  We often take our voices for granted until they give [place] a problem.  Individuals whose livelihood is dependent upon having an optimal voice are often called ???professional voice users.???  Although we think of professional singers and actors as the elite voice users, laryngitis can put a Runner, broadcasting/film/video, salesperson, Optician, dispensing, Emergency planning/management officer, Diplomatic Services operational officer, Clinical research associate, Advertising account planner, among others, out of commission.      How many therapy appointments will I need? Usually, your physician and voice therapist will initially recommend that you return one time per week for approximately two to three weeks. Following the initial phase of therapy, your therapist may recommend less frequent appointments over a period of 2-3 months, depending upon your needs and schedule. Voice therapy is very individualized and follow-up care is tailored to meet your individual voice needs.    What is my role in voice therapy?   Your role in the voice therapy process is very important. Voice therapy, like physical therapy or any other behavioral treatment approach aimed at changing the way in which muscles are used, can be successful when there is commitment to the therapy process.  Therapy is not a ???quick fix.??? It takes time to change muscle patterns, muscle memory and your vocal behaviors.     For Further Information or Scheduling  Call [phone] to schedule an appointment or for additional information.

## 2017-10-04 ENCOUNTER — Other Ambulatory Visit: Admit: 2017-10-04 | Discharge: 2017-10-05 | Payer: MEDICARE

## 2017-10-04 DIAGNOSIS — Z7901 Long term (current) use of anticoagulants: Secondary | ICD-10-CM

## 2017-10-04 DIAGNOSIS — Z79899 Other long term (current) drug therapy: Secondary | ICD-10-CM

## 2017-10-04 DIAGNOSIS — K769 Liver disease, unspecified: Secondary | ICD-10-CM

## 2017-10-04 DIAGNOSIS — Z5181 Encounter for therapeutic drug level monitoring: Secondary | ICD-10-CM

## 2017-10-04 DIAGNOSIS — Z944 Liver transplant status: Principal | ICD-10-CM

## 2017-10-04 LAB — PROTIME: Lab: 15 — ABNORMAL HIGH

## 2017-10-04 LAB — COMPREHENSIVE METABOLIC PANEL
ALKALINE PHOSPHATASE: 112 U/L (ref 38–126)
ALT (SGPT): 27 U/L (ref 19–72)
ANION GAP: 7 mmol/L — ABNORMAL LOW (ref 9–15)
AST (SGOT): 14 U/L — ABNORMAL LOW (ref 19–55)
BILIRUBIN TOTAL: 0.7 mg/dL (ref 0.0–1.2)
BLOOD UREA NITROGEN: 38 mg/dL — ABNORMAL HIGH (ref 7–21)
BUN / CREAT RATIO: 26
CALCIUM: 10.1 mg/dL (ref 8.5–10.2)
CO2: 22 mmol/L (ref 22.0–30.0)
CREATININE: 1.49 mg/dL — ABNORMAL HIGH (ref 0.70–1.30)
EGFR CKD-EPI AA MALE: 55 mL/min/{1.73_m2} — ABNORMAL LOW (ref >=60)
EGFR CKD-EPI NON-AA MALE: 47 mL/min/{1.73_m2} — ABNORMAL LOW (ref >=60)
GLUCOSE RANDOM: 161 mg/dL (ref 65–179)
POTASSIUM: 5.3 mmol/L — ABNORMAL HIGH (ref 3.5–5.0)
PROTEIN TOTAL: 6.4 g/dL — ABNORMAL LOW (ref 6.5–8.3)
SODIUM: 140 mmol/L (ref 135–145)

## 2017-10-04 LAB — CBC W/ AUTO DIFF
BASOPHILS ABSOLUTE COUNT: 0 10*9/L (ref 0.0–0.1)
BASOPHILS RELATIVE PERCENT: 0.2 %
EOSINOPHILS ABSOLUTE COUNT: 0.1 10*9/L (ref 0.0–0.4)
EOSINOPHILS RELATIVE PERCENT: 1.7 %
HEMATOCRIT: 34.9 % — ABNORMAL LOW (ref 41.0–53.0)
HEMOGLOBIN: 11.4 g/dL — ABNORMAL LOW (ref 13.5–17.5)
LARGE UNSTAINED CELLS: 2 % (ref 0–4)
LYMPHOCYTES ABSOLUTE COUNT: 1 10*9/L — ABNORMAL LOW (ref 1.5–5.0)
LYMPHOCYTES RELATIVE PERCENT: 33.9 %
MEAN CORPUSCULAR HEMOGLOBIN CONC: 32.6 g/dL (ref 31.0–37.0)
MEAN CORPUSCULAR HEMOGLOBIN: 30.4 pg (ref 26.0–34.0)
MEAN PLATELET VOLUME: 8.7 fL (ref 7.0–10.0)
MONOCYTES ABSOLUTE COUNT: 0.1 10*9/L — ABNORMAL LOW (ref 0.2–0.8)
MONOCYTES RELATIVE PERCENT: 4.4 %
NEUTROPHILS ABSOLUTE COUNT: 1.8 10*9/L — ABNORMAL LOW (ref 2.0–7.5)
NEUTROPHILS RELATIVE PERCENT: 58.1 %
RED BLOOD CELL COUNT: 3.75 10*12/L — ABNORMAL LOW (ref 4.50–5.90)
WBC ADJUSTED: 3.1 10*9/L — ABNORMAL LOW (ref 4.5–11.0)

## 2017-10-04 LAB — CHLORIDE: Chloride:SCnc:Pt:Ser/Plas:Qn:: 111 — ABNORMAL HIGH

## 2017-10-04 LAB — MEAN CORPUSCULAR HEMOGLOBIN CONC: Lab: 32.6

## 2017-10-04 LAB — BILIRUBIN, DIRECT: BILIRUBIN DIRECT: 0.2 mg/dL (ref 0.00–0.40)

## 2017-10-04 LAB — MAGNESIUM: Magnesium:MCnc:Pt:Ser/Plas:Qn:: 1.9

## 2017-10-04 LAB — BILIRUBIN DIRECT: Bilirubin.glucuronidated:MCnc:Pt:Ser/Plas:Qn:: 0.2

## 2017-10-04 LAB — PHOSPHORUS: Phosphate:MCnc:Pt:Ser/Plas:Qn:: 3

## 2017-10-04 LAB — GAMMA GLUTAMYL TRANSFERASE: Gamma glutamyl transferase:CCnc:Pt:Ser/Plas:Qn:: 42

## 2017-10-05 LAB — CYCLOSPORINE, TROUGH: Lab: 84 — ABNORMAL LOW

## 2017-10-06 NOTE — Unmapped (Signed)
Per PharmD Nedra Hai patient to repeat labs in one week to reassess CSA and INR levels.

## 2017-10-06 NOTE — Unmapped (Signed)
Confirmed patient to see NP Marlan Palau with EP monitoring who can manage patients heart medication.  Sent referral.

## 2017-10-09 ENCOUNTER — Ambulatory Visit: Admit: 2017-10-09 | Discharge: 2017-10-10 | Payer: MEDICARE | Attending: Family | Primary: Family

## 2017-10-09 DIAGNOSIS — Z950 Presence of cardiac pacemaker: Secondary | ICD-10-CM

## 2017-10-09 DIAGNOSIS — Z944 Liver transplant status: Principal | ICD-10-CM

## 2017-10-09 DIAGNOSIS — I4891 Unspecified atrial fibrillation: Secondary | ICD-10-CM

## 2017-10-09 DIAGNOSIS — Z79899 Other long term (current) drug therapy: Secondary | ICD-10-CM

## 2017-10-09 DIAGNOSIS — Z7901 Long term (current) use of anticoagulants: Secondary | ICD-10-CM

## 2017-10-09 NOTE — Unmapped (Signed)
[facility]  Cardiac Device Clinic   Tel: [phone]  Fax: [phone]  Patient: Jeffrey Ward   MRN: [id number]       DOB:  24-Mar-1950  Date:  October 09, 2017  Assessment and Plan:      Jeffrey Ward is a 67 y.o. male with a past medical history of high grade AV block who presents for  Interrogation with Reprogramming in Office (Including Reprogramming to Test Lead Pacing and Sensing Thresholds)      Interrogation of the Medtronic Permanet Pacemaker leadless single chamber implanted in the Left Chest demonstrates Normal Function.    There have been no significant arrhythmias.     The patient is not pacing dependent, with an underlying rhythm of SR.    Continue Routine Monitoring   In person follow-up will be in 12 months, with 2-monthly checks using home monitoring.     Battery:  Estimated Longevity of 8 years  [employer]  Mode:VVIR  Lower rate:50  Upper rate:120    VP:5.6%       Device Threshold Measurements:   P/R Wave (mV) Threshold Amp (V) Pulse Width (ms) Impedance (ohms)   Atrial       RV 8.7 0.5 0.24 510   CS/LV       HV       SVC         Episodes: (Since 04/10/2017)     # Events Comments   NSVT 0    VT 0    VF 0      All questions were answered for the patient.  Please contact the device clinic at [phone] with any questions      See Scanned Device Check PDF File in Patient's Chart

## 2017-10-09 NOTE — Unmapped (Signed)
[facility] Refill Coordination Note    Specialty Medication(s) to be Shipped:   Transplant: mycophenolate mofetil 250mg , Neoral 100mg  and Neoral 25mg     Other medication(s) to be shipped: MIRTAZIPINE.SILDENAFIL,STRIPS,PEN NEEDLES     Jeffrey Ward, DOB: 1950/04/06  Phone: [phone] (home)   Shipping Address: [address]  [place]  [address]    All above HIPAA information was verified with patient's family member.     Completed refill call assessment today to schedule patient's medication shipment from the [facility] [phone]).       Specialty medication(s) and dose(s) confirmed: Regimen is correct and unchanged.   Changes to medications: Han reports no changes reported at this time.  Changes to insurance: No  Questions for the pharmacist: No    The patient will receive an FSI print out for each medication shipped and additional FDA Medication Guides as required.  Patient education from [facility] or Robet Leu may also be included in the shipment.    DISEASE/MEDICATION-SPECIFIC INFORMATION        N/A    ADHERENCE     Medication Adherence    Support network for adherence:  family member              MEDICARE PART B DOCUMENTATION     Mycophenolate mofetil 250mg : Patient has 40 capsules on hand.  Neoral 25mg : Patient has 60 capsules on hand.  Neoral 100mg : Patient has 20 capsules on hand.    SHIPPING     Shipping address confirmed in FSI.     Delivery Scheduled: Yes, Expected medication delivery date: 080719 via UPS or courier.     Jeffrey Ward   [facility] Specialty Technician

## 2017-10-09 NOTE — Unmapped (Signed)
TRF

## 2017-10-09 NOTE — Unmapped (Signed)
Referring Provider: Doyce Loose, MD  [address] [place]  [id number]  [place], [place] [address]   Primary Provider: [name], MD  [address] [facility] [place] [address]   Other Providers: [place] EP: Dr Manus Rudd    Reason for Visit:  Jeffrey Ward is a 67 y.o. male being seen for routine visit and continued care of bradycardia with pacemaker.    Assessment & Plan:  Persistent Atrial fibrillation  CAH2DS2 VASc 1  Anticoagulated with warfarin  No recurrence since amiodarone discontinued   Will discontinue lanoxin as no further atrial fibrillation  Can stop warfarin if no other indication for anticoagulation  Start ASA 81 mg daily after warfarin discontinued    Bradycardia  S/P micra pacemaker  Pacemaker checked by device nurse  I have reviewed and agree with the findings  Normal pacemaker function with minimal RV pacing  He is not pacemaker dependent     PAH  Treated with sildenifil  Followed by Dr Ala Dach    Follow-up:  No follow-ups on file.    History of Present Illness:  Jeffrey Ward  is a 66 y.o. 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  His amiodarone was discontinued 12/2016 and he has remained in sinus rhythm.   He presents today for follow up. In general has been doing well. Denies chest pain or pressure. Denies PND, orthopnea or worsening LE edema. Denies palpitations, syncope or near syncope. Denies pain or swelling at pacemaker site. No bleeding    Cardiovascular History & Procedures:    EP Procedures and Devices:  ?? 09/2016 Medtronic Micra pacemaker    Non-Invasive Evaluation(s):  Echo:  ?? 04/2017  ?? Technically difficult study due to chest wall/lung interference  ?? Normal left ventricular systolic function, ejection fraction 65 to 70%  ?? Diastolic dysfunction - grade I (normal filling pressures)  ?? Dilated left atrium  ?? Normal right ventricular systolic function               Other Past Medical History:  See below for the complete EPIC list of past medical and surgical history.      Allergies:  Patient has no known allergies.    Current Medications:    Current Outpatient Medications on File Prior to Visit   Medication Sig   ??? acetaminophen (TYLENOL) 325 MG tablet Take 1 tablet (325 mg total) by mouth every four (4) hours as needed for pain.   ??? blood sugar diagnostic (FREESTYLE TEST) Strp by Other route Four (4) times a day (before meals and nightly).   ??? blood-glucose meter (GLUCOSE MONITORING KIT) kit Use as instructed   ??? cholecalciferol, vitamin D3, 2,000 unit cap 1 capsule (2,000 Units total) by PEG Tube route daily. (Patient taking differently: Take 1 capsule by mouth daily.)   ??? digoxin (LANOXIN) 125 mcg tablet 0.5 tablets (62.5 mcg total) by G-tube route daily. (Patient taking differently: Take 0.5 tablets by mouth daily.)   ??? insulin ASPART (NOVOLOG FLEXPEN U-100 INSULIN) 100 unit/mL injection pen Inject 0.06 mL (6 Units  total) under the skin Three (3) times a day before meals. (Patient taking differently: Inject 6 Units under the skin Three (3) times a day before meals. PLUS CORRECTIONS (Additional mealtime per SSI-  151-200= 2 units,  201-250= 4 units, 251-300=add 6 units, 301-350=8 units, 351-400=10 units, >400 =12 units))   ??? insulin glargine (LANTUS) 100 unit/mL (3 mL) injection pen Inject 0.12 mL (12 Units total) under the skin nightly.   ??? lancets Misc 1 each by Miscellaneous route Four (4) times a day (before meals and nightly).   ??? magnesium oxide-Mg AA chelate (MAGNESIUM, AMINO ACID CHELATE,) 133 mg Tab Take 2 tablets by mouth Two (2) times a day.   ??? melatonin 3 mg Tab Take 1 tablet (3 mg total) by mouth nightly.   ??? mirtazapine (REMERON) 15 MG tablet Take 1 tablet (15 mg total) by mouth nightly.   ??? mycophenolate (CELLCEPT) 250 mg capsule Take 2 capsules (500 mg total) by mouth Two (2) times a day.   ??? NEORAL 100 mg capsule 175 mg BID (three 25 mg capsules and one 100 mg capsule two times a day)   ??? NEORAL 25 mg capsule 175 mg BID (three 25 mg capsules and one 100 mg capsule two times a day)   ??? omeprazole (PRILOSEC) 20 MG capsule Take 1 capsule (20 mg total) by mouth daily.   ??? pen needle, diabetic (ULTICARE PEN NEEDLE) 32 gauge x 5/32 Ndle 1 each by Miscellaneous route Four (4) times a day (before meals and nightly).   ??? [EXPIRED] predniSONE (DELTASONE) 10 MG tablet Take 3 tablets (30 mg total) by mouth daily. for 3 days   ??? sildenafil, antihypertensive, (REVATIO) 20 mg tablet Take 40 mg by mouth Three (3) times a day.   ??? warfarin (COUMADIN) 3 MG tablet 4.5 mg M/W/F (1.5 pills) and 3 mg all other days (one pill)     No current facility-administered medications on file prior to visit.      Family History:  The patient's family history includes Hypertension in his mother.    Social history:  He  reports that he has never smoked. He has never used smokeless tobacco. He reports that he does not drink alcohol or use drugs.    Review of Systems:  As per HPI.  Rest of the review of ten systems is negative or unremarkable except as stated above.    Physical Exam:  VITAL SIGNS:   Vitals:    10/09/17 1040   BP: 148/80   Pulse: 68   SpO2: 99%      Wt Readings from Last 3 Encounters:   10/09/17 67.1 kg (148 lb)   10/03/17 65.7 kg (144 lb 12.8 oz)   09/25/17 65.2 kg (143 lb 11.2 oz)      Today's Body mass index is 22.51 kg/m??.      GENERAL: chronically ill-appearing in no acute distress  HEENT: Normocephalic and atraumatic. Conjunctivae and sclerae clear and anicteric.    NECK: Supple, JVP not seen above the clavicle with HOB at 90 degrees.   CARDIOVASCULAR: Rate and rhythm are regular.    RESPIRATORY: Normal respiratory effort. Clear to auscultation bilaterally.  There are no wheezes.  ABDOMEN: Soft, non-tender, Abdomen nondistended.    EXTREMITIES:  Radial pulses are 2+, bilaterally. There is no pedal edema, bilaterally.   SKIN: No rashes, ecchymosis or petechiae.  Warm, well perfused.   NEURO/PSYCH: Alert and oriented x 3. Affect appropriate.  Nonfocal    Pertinent Laboratory Studies:  Appointment on 10/04/2017   Component Date Value Ref Range Status   ??? Sodium 10/04/2017 140  135 - 145 mmol/L Final   ??? Potassium 10/04/2017 5.3* 3.5 - 5.0 mmol/L Final   ??? Chloride 10/04/2017 111* 98 - 107 mmol/L Final   ??? CO2 10/04/2017 22.0  22.0 - 30.0 mmol/L Final   ??? Anion Gap 10/04/2017 7* 9 - 15 mmol/L Final   ??? BUN 10/04/2017 38* 7 - 21 mg/dL Final   ??? Creatinine 10/04/2017 1.49* 0.70 - 1.30 mg/dL Final   ??? BUN/Creatinine Ratio 10/04/2017 26   Final   ??? EGFR CKD-EPI Non-African American,* 10/04/2017 47* >=60 mL/min/1.35m2 Final   ??? EGFR CKD-EPI African American, Male 10/04/2017 55* >=60 mL/min/1.21m2 Final   ??? Glucose 10/04/2017 161  65 - 179 mg/dL Final   ??? Calcium 16/12/9602 10.1  8.5 - 10.2 mg/dL Final   ??? Albumin 54/11/8117 3.9  3.5 - 5.0 g/dL Final   ??? Total Protein 10/04/2017 6.4* 6.5 - 8.3 g/dL Final   ??? Total Bilirubin 10/04/2017 0.7  0.0 - 1.2 mg/dL Final   ??? AST 14/78/2956 14* 19 - 55 U/L Final   ??? ALT 10/04/2017 27  19 - 72 U/L Final   ??? Alkaline Phosphatase 10/04/2017 112  38 - 126 U/L Final   ??? Bilirubin, Direct 10/04/2017 0.20  0.00 - 0.40 mg/dL Final   ??? Phosphorus 10/04/2017 3.0  2.9 - 4.7 mg/dL Final   ??? Magnesium 21/30/8657 1.9  1.6 - 2.2 mg/dL Final   ??? GGT 84/69/6295 42  12 - 109 U/L Final   ??? Cyclosporine, Trough 10/04/2017 84* 100 - 400 ng/mL Final   ??? PT 10/04/2017 15.0* 10.2 - 12.8 sec Final   ??? INR 10/04/2017 1.32 Final   ??? WBC 10/04/2017 3.1* 4.5 - 11.0 10*9/L Final   ??? RBC 10/04/2017 3.75* 4.50 - 5.90 10*12/L Final   ??? HGB 10/04/2017 11.4* 13.5 - 17.5 g/dL Final   ??? HCT 28/41/3244 34.9* 41.0 - 53.0 % Final   ??? MCV 10/04/2017 93.0  80.0 - 100.0 fL Final   ??? MCH 10/04/2017 30.4  26.0 - 34.0 pg Final   ??? MCHC 10/04/2017 32.6  31.0 - 37.0 g/dL Final   ??? RDW 03/16/7251 15.1* 12.0 - 15.0 % Final   ??? MPV 10/04/2017 8.7  7.0 - 10.0 fL Final   ??? Platelet 10/04/2017 79* 150 - 440 10*9/L Final   ??? Neutrophils % 10/04/2017 58.1  % Final   ??? Lymphocytes % 10/04/2017 33.9  % Final   ??? Monocytes % 10/04/2017 4.4  % Final   ??? Eosinophils % 10/04/2017 1.7  % Final   ??? Basophils % 10/04/2017 0.2  % Final   ??? Absolute Neutrophils 10/04/2017 1.8* 2.0 - 7.5 10*9/L Final   ??? Absolute Lymphocytes 10/04/2017 1.0* 1.5 - 5.0 10*9/L Final   ??? Absolute Monocytes 10/04/2017 0.1* 0.2 - 0.8 10*9/L Final   ??? Absolute Eosinophils 10/04/2017 0.1  0.0 - 0.4 10*9/L Final   ??? Absolute Basophils 10/04/2017 0.0  0.0 - 0.1 10*9/L Final   ??? Large Unstained Cells 10/04/2017 2  0 - 4 % Final   Office Visit on 09/21/2017   Component Date Value Ref Range Status   ??? HCV RNA 09/21/2017 Not Detected   Final   ??? HCV RNA Comment 09/21/2017    Final                    Value:Hepatitis C virus  RNA quantification is performed using the FDA-approved Abbott RealTime PCR HCV test, targeting the 5' UTR. This test can quantify HCV RNA over the range of 12-100,000,000 IU/mL (1.08 log(10) - 8.0 log(10) IU/mL). Both the limit of detection and limit of quantification are 12 IU/mL. The reference range for this assay is Not Detected.   ??? AFP-Tumor Marker 09/21/2017 2  <8 ng/mL Final   ??? Hemoglobin A1C 09/21/2017 6.4* 4.8 - 5.6 % Final   ??? Estimated Average Glucose 09/21/2017 137  mg/dL Final   ??? Cyclosporine, Trough 09/21/2017 123  100 - 400 ng/mL Final   ??? CMV Viral Ld 09/21/2017 Not Detected  Not Detected Final   ??? PT 09/21/2017 12.8  10.2 - 12.8 sec Final   ??? INR 09/21/2017 1.12 Final   ??? GGT 09/21/2017 47  12 - 109 U/L Final   ??? Magnesium 09/21/2017 1.9  1.6 - 2.2 mg/dL Final   ??? Phosphorus 09/21/2017 3.2  2.9 - 4.7 mg/dL Final   ??? Bilirubin, Direct 09/21/2017 <0.10  0.00 - 0.40 mg/dL Final   ??? Sodium 16/12/9602 136  135 - 145 mmol/L Final   ??? Potassium 09/21/2017 5.0  3.5 - 5.0 mmol/L Final   ??? Chloride 09/21/2017 105  98 - 107 mmol/L Final   ??? CO2 09/21/2017 22.0  22.0 - 30.0 mmol/L Final   ??? Anion Gap 09/21/2017 9  9 - 15 mmol/L Final   ??? BUN 09/21/2017 49* 7 - 21 mg/dL Final   ??? Creatinine 09/21/2017 1.58* 0.70 - 1.30 mg/dL Final   ??? BUN/Creatinine Ratio 09/21/2017 31   Final   ??? EGFR CKD-EPI Non-African American,* 09/21/2017 44* >=60 mL/min/1.20m2 Final   ??? EGFR CKD-EPI African American, Male 09/21/2017 51* >=60 mL/min/1.76m2 Final   ??? Glucose 09/21/2017 176* 65 - 99 mg/dL Final   ??? Calcium 54/11/8117 9.7  8.5 - 10.2 mg/dL Final   ??? Albumin 14/78/2956 3.7  3.5 - 5.0 g/dL Final   ??? Total Protein 09/21/2017 6.1* 6.5 - 8.3 g/dL Final   ??? Total Bilirubin 09/21/2017 0.4  0.0 - 1.2 mg/dL Final   ??? AST 21/30/8657 9* 19 - 55 U/L Final   ??? ALT 09/21/2017 17* 19 - 72 U/L Final   ??? Alkaline Phosphatase 09/21/2017 103  38 - 126 U/L Final   ??? WBC 09/21/2017 3.9* 4.5 - 11.0 10*9/L Final   ??? RBC 09/21/2017 3.72* 4.50 - 5.90 10*12/L Final   ??? HGB 09/21/2017 11.2* 13.5 - 17.5 g/dL Final   ??? HCT 84/69/6295 33.9* 41.0 - 53.0 % Final   ??? MCV 09/21/2017 91.2  80.0 - 100.0 fL Final   ??? MCH 09/21/2017 30.1  26.0 - 34.0 pg Final   ??? MCHC 09/21/2017 33.0  31.0 - 37.0 g/dL Final   ??? RDW 28/41/3244 15.0  12.0 - 15.0 % Final   ??? MPV 09/21/2017 8.9  7.0 - 10.0 fL Final   ??? Platelet 09/21/2017 114* 150 - 440 10*9/L Final   ??? Neutrophils % 09/21/2017 45.7  % Final   ??? Lymphocytes % 09/21/2017 43.1  % Final   ??? Monocytes % 09/21/2017 6.4  % Final   ??? Eosinophils % 09/21/2017 2.3  % Final   ??? Basophils % 09/21/2017 0.3  % Final   ??? Absolute Neutrophils 09/21/2017 1.8* 2.0 - 7.5 10*9/L Final   ??? Absolute Lymphocytes 09/21/2017 1.7  1.5 - 5.0 10*9/L Final   ??? Absolute Monocytes 09/21/2017 0.3  0.2 - 0.8 10*9/L Final   ???  Absolute Eosinophils 09/21/2017 0.1  0.0 - 0.4 10*9/L Final   ??? Absolute Basophils 09/21/2017 0.0  0.0 - 0.1 10*9/L Final   ??? Large Unstained Cells 09/21/2017 2  0 - 4 % Final   Appointment on 09/07/2017   Component Date Value Ref Range Status   ??? Sodium 09/07/2017 139  135 - 145 mmol/L Final   ??? Potassium 09/07/2017 5.1* 3.5 - 5.0 mmol/L Final   ??? Chloride 09/07/2017 110* 98 - 107 mmol/L Final   ??? CO2 09/07/2017 22.0  22.0 - 30.0 mmol/L Final   ??? Anion Gap 09/07/2017 7* 9 - 15 mmol/L Final   ??? BUN 09/07/2017 39* 7 - 21 mg/dL Final   ??? Creatinine 09/07/2017 1.53* 0.70 - 1.30 mg/dL Final   ??? BUN/Creatinine Ratio 09/07/2017 25   Final   ??? EGFR CKD-EPI Non-African American,* 09/07/2017 46* >=60 mL/min/1.41m2 Final   ??? EGFR CKD-EPI African American, Male 09/07/2017 53* >=60 mL/min/1.96m2 Final   ??? Glucose 09/07/2017 96  65 - 179 mg/dL Final   ??? Calcium 44/03/270 9.7  8.5 - 10.2 mg/dL Final   ??? Albumin 53/66/4403 3.7  3.5 - 5.0 g/dL Final   ??? Total Protein 09/07/2017 6.2* 6.5 - 8.3 g/dL Final   ??? Total Bilirubin 09/07/2017 0.5  0.0 - 1.2 mg/dL Final   ??? AST 47/42/5956 13* 19 - 55 U/L Final   ??? ALT 09/07/2017 18* 19 - 72 U/L Final   ??? Alkaline Phosphatase 09/07/2017 110  38 - 126 U/L Final   ??? Bilirubin, Direct 09/07/2017 0.30  0.00 - 0.40 mg/dL Final   ??? Phosphorus 09/07/2017 3.8  2.9 - 4.7 mg/dL Final   ??? Magnesium 38/75/6433 1.8  1.6 - 2.2 mg/dL Final   ??? GGT 29/51/8841 57  12 - 109 U/L Final   ??? Cyclosporine, Trough 09/07/2017 127  100 - 400 ng/mL Final   ??? PT 09/07/2017 12.9* 10.2 - 12.8 sec Final   ??? INR 09/07/2017 1.13   Final   ??? WBC 09/07/2017 3.2* 4.5 - 11.0 10*9/L Final   ??? RBC 09/07/2017 3.71* 4.50 - 5.90 10*12/L Final   ??? HGB 09/07/2017 11.4* 13.5 - 17.5 g/dL Final   ??? HCT 66/08/3014 34.3* 41.0 - 53.0 % Final   ??? MCV 09/07/2017 92.4  80.0 - 100.0 fL Final   ??? MCH 09/07/2017 30.7  26.0 - 34.0 pg Final   ??? MCHC 09/07/2017 33.2  31.0 - 37.0 g/dL Final   ??? RDW 03/22/3233 15.5* 12.0 - 15.0 % Final   ??? MPV 09/07/2017 8.4  7.0 - 10.0 fL Final   ??? Platelet 09/07/2017 124* 150 - 440 10*9/L Final   ??? Neutrophils % 09/07/2017 57.9  % Final   ??? Lymphocytes % 09/07/2017 31.7  % Final   ??? Monocytes % 09/07/2017 6.2  % Final   ??? Eosinophils % 09/07/2017 1.6  % Final   ??? Basophils % 09/07/2017 0.3  % Final   ??? Absolute Neutrophils 09/07/2017 1.9* 2.0 - 7.5 10*9/L Final   ??? Absolute Lymphocytes 09/07/2017 1.0* 1.5 - 5.0 10*9/L Final   ??? Absolute Monocytes 09/07/2017 0.2  0.2 - 0.8 10*9/L Final   ??? Absolute Eosinophils 09/07/2017 0.1  0.0 - 0.4 10*9/L Final   ??? Absolute Basophils 09/07/2017 0.0  0.0 - 0.1 10*9/L Final   ??? Large Unstained Cells 09/07/2017 2  0 - 4 % Final   Appointment on 08/31/2017   Component Date Value Ref Range Status   ???  Sodium 08/31/2017 139  135 - 145 mmol/L Final   ??? Potassium 08/31/2017 5.2* 3.5 - 5.0 mmol/L Final   ??? Chloride 08/31/2017 110* 98 - 107 mmol/L Final   ??? CO2 08/31/2017 22.0  22.0 - 30.0 mmol/L Final   ??? BUN 08/31/2017 33* 7 - 21 mg/dL Final   ??? Creatinine 08/31/2017 1.47* 0.70 - 1.30 mg/dL Final   ??? BUN/Creatinine Ratio 08/31/2017 22   Final   ??? EGFR MDRD Non Af Amer 08/31/2017 47* >=60 mL/min/1.65m2 Final   ??? EGFR MDRD Af Amer 08/31/2017 58* >=60 mL/min/1.29m2 Final   ??? Anion Gap 08/31/2017 7* 9 - 15 mmol/L Final   ??? Glucose 08/31/2017 147  65 - 179 mg/dL Final   ??? Calcium 16/12/9602 9.7  8.5 - 10.2 mg/dL Final   ??? Albumin 54/11/8117 3.6  3.5 - 5.0 g/dL Final   ??? Total Protein 08/31/2017 6.1* 6.5 - 8.3 g/dL Final   ??? Total Bilirubin 08/31/2017 0.4  0.0 - 1.2 mg/dL Final   ??? AST 14/78/2956 12* 19 - 55 U/L Final   ??? ALT 08/31/2017 20  19 - 72 U/L Final   ??? Alkaline Phosphatase 08/31/2017 122  38 - 126 U/L Final   ??? Bilirubin, Direct 08/31/2017 <0.10  0.00 - 0.40 mg/dL Final   ??? Phosphorus 08/31/2017 3.4  2.9 - 4.7 mg/dL Final   ??? Magnesium 21/30/8657 1.9  1.6 - 2.2 mg/dL Final   ??? GGT 84/69/6295 56  12 - 109 U/L Final   ??? Cyclosporine, Trough 08/31/2017 82* 100 - 400 ng/mL Final   ??? PT 08/31/2017 12.7  10.2 - 12.8 sec Final   ??? INR 08/31/2017 1.11   Final   ??? WBC 08/31/2017 2.8* 4.5 - 11.0 10*9/L Final   ??? RBC 08/31/2017 3.61* 4.50 - 5.90 10*12/L Final   ??? HGB 08/31/2017 11.0* 13.5 - 17.5 g/dL Final   ??? HCT 28/41/3244 33.5* 41.0 - 53.0 % Final   ??? MCV 08/31/2017 92.8  80.0 - 100.0 fL Final   ??? MCH 08/31/2017 30.6  26.0 - 34.0 pg Final   ??? MCHC 08/31/2017 33.0  31.0 - 37.0 g/dL Final   ??? RDW 03/16/7251 15.5* 12.0 - 15.0 % Final   ??? MPV 08/31/2017 8.1  7.0 - 10.0 fL Final   ??? Platelet 08/31/2017 109* 150 - 440 10*9/L Final   ??? Neutrophils % 08/31/2017 53.3  % Final   ??? Lymphocytes % 08/31/2017 36.1  % Final   ??? Monocytes % 08/31/2017 6.3  % Final   ??? Eosinophils % 08/31/2017 2.0  % Final   ??? Basophils % 08/31/2017 0.1  % Final   ??? Absolute Neutrophils 08/31/2017 1.5* 2.0 - 7.5 10*9/L Final   ??? Absolute Lymphocytes 08/31/2017 1.0* 1.5 - 5.0 10*9/L Final   ??? Absolute Monocytes 08/31/2017 0.2  0.2 - 0.8 10*9/L Final   ??? Absolute Eosinophils 08/31/2017 0.1  0.0 - 0.4 10*9/L Final   ??? Absolute Basophils 08/31/2017 0.0  0.0 - 0.1 10*9/L Final   ??? Large Unstained Cells 08/31/2017 2  0 - 4 % Final   Appointment on 07/19/2017   Component Date Value Ref Range Status   ??? Sodium 07/19/2017 138  135 - 145 mmol/L Final   ??? Potassium 07/19/2017 4.6  3.5 - 5.0 mmol/L Final   ??? Chloride 07/19/2017 108* 98 - 107 mmol/L Final   ??? CO2 07/19/2017 22.0  22.0 - 30.0 mmol/L Final   ??? BUN 07/19/2017 40* 7 -  21 mg/dL Final   ??? Creatinine 07/19/2017 1.41* 0.70 - 1.30 mg/dL Final   ??? BUN/Creatinine Ratio 07/19/2017 28   Final   ??? EGFR MDRD Non Af Amer 07/19/2017 50* >=60 mL/min/1.3m2 Final   ??? EGFR MDRD Af Amer 07/19/2017 >=60  >=60 mL/min/1.60m2 Final   ??? Anion Gap 07/19/2017 8* 9 - 15 mmol/L Final   ??? Glucose 07/19/2017 154  65 - 179 mg/dL Final   ??? Calcium 46/96/2952 9.8  8.5 - 10.2 mg/dL Final   ??? Albumin 84/13/2440 3.6  3.5 - 5.0 g/dL Final   ??? Total Protein 07/19/2017 6.0* 6.5 - 8.3 g/dL Final   ??? Total Bilirubin 07/19/2017 0.7  0.0 - 1.2 mg/dL Final   ??? AST 01/08/2535 18* 19 - 55 U/L Final   ??? ALT 07/19/2017 13* 19 - 72 U/L Final   ??? Alkaline Phosphatase 07/19/2017 155* 38 - 126 U/L Final   ??? Bilirubin, Direct 07/19/2017 0.40  0.00 - 0.40 mg/dL Final   ??? Phosphorus 07/19/2017 3.3  2.9 - 4.7 mg/dL Final   ??? Magnesium 64/40/3474 1.9  1.6 - 2.2 mg/dL Final   ??? GGT 25/95/6387 188* 12 - 109 U/L Final   ??? Cyclosporine, Trough 07/19/2017 95  <=800 ng/mL Final   ??? WBC 07/19/2017 3.0* 4.5 - 11.0 10*9/L Final   ??? RBC 07/19/2017 3.50* 4.50 - 5.90 10*12/L Final   ??? HGB 07/19/2017 10.5* 13.5 - 17.5 g/dL Final   ??? HCT 56/43/3295 32.0* 41.0 - 53.0 % Final   ??? MCV 07/19/2017 91.4  80.0 - 100.0 fL Final   ??? MCH 07/19/2017 30.0  26.0 - 34.0 pg Final   ??? MCHC 07/19/2017 32.8  31.0 - 37.0 g/dL Final   ??? RDW 18/84/1660 16.8* 12.0 - 15.0 % Final   ??? MPV 07/19/2017 8.2  7.0 - 10.0 fL Final   ??? Platelet 07/19/2017 107* 150 - 440 10*9/L Final   ??? Neutrophils % 07/19/2017 48.4  % Final   ??? Lymphocytes % 07/19/2017 39.7  % Final   ??? Monocytes % 07/19/2017 6.6  % Final   ??? Eosinophils % 07/19/2017 3.0  % Final   ??? Basophils % 07/19/2017 0.4  % Final   ??? Absolute Neutrophils 07/19/2017 1.4* 2.0 - 7.5 10*9/L Final   ??? Absolute Lymphocytes 07/19/2017 1.2* 1.5 - 5.0 10*9/L Final   ??? Absolute Monocytes 07/19/2017 0.2  0.2 - 0.8 10*9/L Final   ??? Absolute Eosinophils 07/19/2017 0.1  0.0 - 0.4 10*9/L Final   ??? Absolute Basophils 07/19/2017 0.0  0.0 - 0.1 10*9/L Final   ??? Large Unstained Cells 07/19/2017 2  0 - 4 % Final   ??? Anisocytosis 07/19/2017 Slight* Not Present Final   ??? PRO-BNP 07/19/2017 197.0  0.0 - 229.0 pg/mL Final   Appointment on 07/13/2017   Component Date Value Ref Range Status   ??? PT 07/13/2017 13.2* 10.2 - 12.8 sec Final   ??? INR 07/13/2017 1.16   Final   ??? Cyclosporine, Trough 07/13/2017 302  <=800 ng/mL Final   ??? GGT 07/13/2017 293* 12 - 109 U/L Final   ??? Magnesium 07/13/2017 1.8  1.6 - 2.2 mg/dL Final   ??? Phosphorus 07/13/2017 3.8  2.9 - 4.7 mg/dL Final   ??? Bilirubin, Direct 07/13/2017 0.40  0.00 - 0.40 mg/dL Final   ??? Sodium 63/03/6008 138  135 - 145 mmol/L Final   ??? Potassium 07/13/2017 4.9  3.5 - 5.0 mmol/L Final   ??? Chloride 07/13/2017 105  98 - 107 mmol/L Final   ??? CO2 07/13/2017 27.0  22.0 - 30.0 mmol/L Final   ??? BUN 07/13/2017 39* 7 - 21 mg/dL Final   ??? Creatinine 07/13/2017 1.40* 0.70 - 1.30 mg/dL Final   ??? BUN/Creatinine Ratio 07/13/2017 28   Final   ??? EGFR MDRD Non Af Amer 07/13/2017 50* >=60 mL/min/1.74m2 Final   ??? EGFR MDRD Af Amer 07/13/2017 >=60  >=60 mL/min/1.19m2 Final   ??? Anion Gap 07/13/2017 6* 9 - 15 mmol/L Final   ??? Glucose 07/13/2017 92  65 - 99 mg/dL Final   ??? Calcium 16/12/9602 10.4* 8.5 - 10.2 mg/dL Final   ??? Albumin 54/11/8117 3.5  3.5 - 5.0 g/dL Final   ??? Total Protein 07/13/2017 6.2* 6.5 - 8.3 g/dL Final   ??? Total Bilirubin 07/13/2017 0.6  0.0 - 1.2 mg/dL Final   ??? AST 14/78/2956 14* 19 - 55 U/L Final   ??? ALT 07/13/2017 19  19 - 72 U/L Final   ??? Alkaline Phosphatase 07/13/2017 225* 38 - 126 U/L Final   ??? WBC 07/13/2017 4.4* 4.5 - 11.0 10*9/L Final   ??? RBC 07/13/2017 3.78* 4.50 - 5.90 10*12/L Final   ??? HGB 07/13/2017 11.3* 13.5 - 17.5 g/dL Final   ??? HCT 21/30/8657 35.2* 41.0 - 53.0 % Final   ??? MCV 07/13/2017 93.0  80.0 - 100.0 fL Final   ??? MCH 07/13/2017 29.8  26.0 - 34.0 pg Final   ??? MCHC 07/13/2017 32.0  31.0 - 37.0 g/dL Final   ??? RDW 84/69/6295 16.5* 12.0 - 15.0 % Final   ??? MPV 07/13/2017 8.4  7.0 - 10.0 fL Final   ??? Platelet 07/13/2017 156  150 - 440 10*9/L Final   ??? Neutrophils % 07/13/2017 67.2  % Final   ??? Lymphocytes % 07/13/2017 23.8  % Final   ??? Monocytes % 07/13/2017 5.2  % Final   ??? Eosinophils % 07/13/2017 1.4  % Final   ??? Basophils % 07/13/2017 0.7  % Final   ??? Absolute Neutrophils 07/13/2017 3.0  2.0 - 7.5 10*9/L Final   ??? Absolute Lymphocytes 07/13/2017 1.1* 1.5 - 5.0 10*9/L Final   ??? Absolute Monocytes 07/13/2017 0.2  0.2 - 0.8 10*9/L Final   ??? Absolute Eosinophils 07/13/2017 0.1  0.0 - 0.4 10*9/L Final   ??? Absolute Basophils 07/13/2017 0.0  0.0 - 0.1 10*9/L Final   ??? Large Unstained Cells 07/13/2017 2  0 - 4 % Final   ??? Macrocytosis 07/13/2017 Slight* Not Present Final   ??? Anisocytosis 07/13/2017 Slight* Not Present Final   ??? Hypochromasia 07/13/2017 Slight* Not Present Final   Appointment on 07/07/2017   Component Date Value Ref Range Status   ??? Sodium 07/07/2017 138  135 - 145 mmol/L Final   ??? Potassium 07/07/2017 4.8  3.5 - 5.0 mmol/L Final   ??? Chloride 07/07/2017 108* 98 - 107 mmol/L Final   ??? CO2 07/07/2017 23.0  22.0 - 30.0 mmol/L Final   ??? BUN 07/07/2017 33* 7 - 21 mg/dL Final   ??? Creatinine 07/07/2017 1.24  0.70 - 1.30 mg/dL Final   ??? BUN/Creatinine Ratio 07/07/2017 27   Final   ??? EGFR MDRD Non Af Amer 07/07/2017 58* >=60 mL/min/1.43m2 Final   ??? EGFR MDRD Af Amer 07/07/2017 >=60  >=60 mL/min/1.42m2 Final   ??? Anion Gap 07/07/2017 7* 9 - 15 mmol/L Final   ??? Glucose 07/07/2017 162  65 - 179 mg/dL Final   ??? Calcium 28/41/3244  10.1  8.5 - 10.2 mg/dL Final   ??? Albumin 36/64/4034 3.3* 3.5 - 5.0 g/dL Final   ??? Total Protein 07/07/2017 5.9* 6.5 - 8.3 g/dL Final   ??? Total Bilirubin 07/07/2017 0.7  0.0 - 1.2 mg/dL Final   ??? AST 74/25/9563 14* 19 - 55 U/L Final   ??? ALT 07/07/2017 19  19 - 72 U/L Final   ??? Alkaline Phosphatase 07/07/2017 327* 38 - 126 U/L Final   ??? Bilirubin, Direct 07/07/2017 0.60* 0.00 - 0.40 mg/dL Final   ??? Phosphorus 07/07/2017 3.8  2.9 - 4.7 mg/dL Final   ??? Magnesium 87/56/4332 1.8  1.6 - 2.2 mg/dL Final   ??? GGT 95/18/8416 417* 12 - 109 U/L Final   ??? Cyclosporine, Trough 07/07/2017 126  <=800 ng/mL Final   ??? PT 07/07/2017 14.1* 10.2 - 12.8 sec Final   ??? INR 07/07/2017 1.24   Final   ??? WBC 07/07/2017 3.1* 4.5 - 11.0 10*9/L Final   ??? RBC 07/07/2017 3.43* 4.50 - 5.90 10*12/L Final   ??? HGB 07/07/2017 10.3* 13.5 - 17.5 g/dL Final   ??? HCT 60/63/0160 31.8* 41.0 - 53.0 % Final   ??? MCV 07/07/2017 92.8  80.0 - 100.0 fL Final ??? MCH 07/07/2017 30.0  26.0 - 34.0 pg Final   ??? MCHC 07/07/2017 32.3  31.0 - 37.0 g/dL Final   ??? RDW 10/93/2355 17.4* 12.0 - 15.0 % Final   ??? MPV 07/07/2017 8.2  7.0 - 10.0 fL Final   ??? Platelet 07/07/2017 104* 150 - 440 10*9/L Final   ??? Neutrophils % 07/07/2017 62.7  % Final   ??? Lymphocytes % 07/07/2017 25.9  % Final   ??? Monocytes % 07/07/2017 8.0  % Final   ??? Eosinophils % 07/07/2017 1.5  % Final   ??? Basophils % 07/07/2017 0.3  % Final   ??? Absolute Neutrophils 07/07/2017 1.9* 2.0 - 7.5 10*9/L Final   ??? Absolute Lymphocytes 07/07/2017 0.8* 1.5 - 5.0 10*9/L Final   ??? Absolute Monocytes 07/07/2017 0.3  0.2 - 0.8 10*9/L Final   ??? Absolute Eosinophils 07/07/2017 0.1  0.0 - 0.4 10*9/L Final   ??? Absolute Basophils 07/07/2017 0.0  0.0 - 0.1 10*9/L Final   ??? Large Unstained Cells 07/07/2017 2  0 - 4 % Final   ??? Anisocytosis 07/07/2017 Slight* Not Present Final   Admission on 06/29/2017, Discharged on 06/29/2017   Component Date Value Ref Range Status   ??? Glucose, POC 06/29/2017 95  65 - 179 mg/dL Final   Office Visit on 06/22/2017   Component Date Value Ref Range Status   ??? Vitamin D Total (25OH) 06/22/2017 50.3  20.0 - 80.0 ng/mL Final   Appointment on 06/22/2017   Component Date Value Ref Range Status   ??? PT 06/22/2017 24.4* 10.2 - 12.8 sec Final   ??? INR 06/22/2017 2.14   Final   ??? Cyclosporine, Trough 06/22/2017 253  <=800 ng/mL Final   ??? GGT 06/22/2017 528* 12 - 109 U/L Final   ??? Magnesium 06/22/2017 1.8  1.6 - 2.2 mg/dL Final   ??? Phosphorus 06/22/2017 3.2  2.9 - 4.7 mg/dL Final   ??? Bilirubin, Direct 06/22/2017 <0.10  0.00 - 0.40 mg/dL Final   ??? Sodium 73/22/0254 138  135 - 145 mmol/L Final   ??? Potassium 06/22/2017 4.6  3.5 - 5.0 mmol/L Final   ??? Chloride 06/22/2017 103  98 - 107 mmol/L Final   ??? CO2 06/22/2017 25.0  22.0 - 30.0  mmol/L Final   ??? BUN 06/22/2017 24* 7 - 21 mg/dL Final   ??? Creatinine 06/22/2017 1.60* 0.70 - 1.30 mg/dL Final   ??? BUN/Creatinine Ratio 06/22/2017 15   Final   ??? EGFR MDRD Non Af Amer 06/22/2017 43* >=60 mL/min/1.40m2 Final   ??? EGFR MDRD Af Amer 06/22/2017 52* >=60 mL/min/1.68m2 Final   ??? Anion Gap 06/22/2017 10  9 - 15 mmol/L Final   ??? Glucose 06/22/2017 92  65 - 99 mg/dL Final   ??? Calcium 16/12/9602 9.5  8.5 - 10.2 mg/dL Final   ??? Albumin 54/11/8117 3.2* 3.5 - 5.0 g/dL Final   ??? Total Protein 06/22/2017 5.6* 6.5 - 8.3 g/dL Final   ??? Total Bilirubin 06/22/2017 0.4  0.0 - 1.2 mg/dL Final   ??? AST 14/78/2956 14* 19 - 55 U/L Final   ??? ALT 06/22/2017 26  19 - 72 U/L Final   ??? Alkaline Phosphatase 06/22/2017 513* 38 - 126 U/L Final   ??? WBC 06/22/2017 3.5* 4.5 - 11.0 10*9/L Final   ??? RBC 06/22/2017 3.44* 4.50 - 5.90 10*12/L Final   ??? HGB 06/22/2017 10.0* 13.5 - 17.5 g/dL Final   ??? HCT 21/30/8657 31.5* 41.0 - 53.0 % Final   ??? MCV 06/22/2017 91.4  80.0 - 100.0 fL Final   ??? MCH 06/22/2017 29.0  26.0 - 34.0 pg Final   ??? MCHC 06/22/2017 31.7  31.0 - 37.0 g/dL Final   ??? RDW 84/69/6295 17.0* 12.0 - 15.0 % Final   ??? MPV 06/22/2017 8.6  7.0 - 10.0 fL Final   ??? Platelet 06/22/2017 205  150 - 440 10*9/L Final   ??? Neutrophils % 06/22/2017 68.0  % Final   ??? Lymphocytes % 06/22/2017 21.7  % Final   ??? Monocytes % 06/22/2017 6.4  % Final   ??? Eosinophils % 06/22/2017 2.1  % Final   ??? Basophils % 06/22/2017 0.3  % Final   ??? Absolute Neutrophils 06/22/2017 2.4  2.0 - 7.5 10*9/L Final   ??? Absolute Lymphocytes 06/22/2017 0.8* 1.5 - 5.0 10*9/L Final   ??? Absolute Monocytes 06/22/2017 0.2  0.2 - 0.8 10*9/L Final   ??? Absolute Eosinophils 06/22/2017 0.1  0.0 - 0.4 10*9/L Final   ??? Absolute Basophils 06/22/2017 0.0  0.0 - 0.1 10*9/L Final   ??? Large Unstained Cells 06/22/2017 2  0 - 4 % Final   ??? Macrocytosis 06/22/2017 Slight* Not Present Final   ??? Anisocytosis 06/22/2017 Slight* Not Present Final   ??? Hypochromasia 06/22/2017 Marked* Not Present Final   ??? Smear Review Comments 06/22/2017 See Comment* Undefined Final   There may be more visits with results that are not included.       Lab Results   Component Value Date    PROBNP 197.0 07/19/2017    PROBNP 1,830.0 (H) 10/28/2016    PROBNP 40,800.0 (H) 09/20/2016    CREATININE 1.49 (H) 10/04/2017    CREATININE 1.58 (H) 09/21/2017    CREATININE 1.0 09/25/2015    CREATININE 0.9 04/03/2015    BUN 38 (H) 10/04/2017    BUN 49 (H) 09/21/2017    BUN 12 01/09/2014    BUN 14 10/10/2013    NA 140 10/04/2017    NA 141 09/26/2016    K 5.3 (H) 10/04/2017    K 4.1 09/26/2016    CO2 22.0 10/04/2017    CO2 29 10/10/2013    MG 1.9 10/04/2017    BILITOT 0.7 10/04/2017    BILITOT 2.4 (  H) 01/09/2014    INR 1.32 10/04/2017    INR 1.3 01/09/2014       Lab Results   Component Value Date    DIGOXIN 0.7 (L) 06/08/2017       Lab Results   Component Value Date    TSH 1.970 04/10/2017    TSH 2.70 07/21/2010    CHOL 212 (H) 12/26/2016    TRIG 101 12/26/2016    HDL 70 (H) 12/26/2016    NONHDL 142 12/26/2016    LDL 295 (H) 12/26/2016       Lab Results   Component Value Date    WBC 3.1 (L) 10/04/2017    WBC 2.4 (L) 01/09/2014    HGB 11.4 (L) 10/04/2017    HGB 6.2 (L) 09/26/2016    HCT 34.9 (L) 10/04/2017    HCT 35.4 (L) 01/09/2014    PLT 79 (L) 10/04/2017    PLT 35 (L) 01/09/2014       Pertinent Test Results from Today:  ECG: NSR.  See official ECG report.  The following are further history from the patient's EPIC record for reference:     Past Medical History:   Diagnosis Date   ??? Atrial fibrillation (CMS-HCC)    ??? Basal cell carcinoma    ??? Cancer (CMS-HCC)    ??? Cirrhosis (CMS-HCC)    ??? Diabetes (CMS-HCC)    ??? Hepatitis C 07/17/2012   ??? Liver disease    ??? Low back pain 07/17/2012   ??? Varices, esophageal (CMS-HCC)        Past Surgical History:   Procedure Laterality Date   ??? ANKLE SURGERY     ??? BACK SURGERY     ??? BACK SURGERY     ??? CARDIAC SURGERY      pacemaker   ??? CHG US GUIDE, TISSUE ABLATION N/A 01/22/2016    Procedure: ULTRASOUND GUIDANCE FOR, AND MONITORING OF, PARENCHYMAL TISSUE ABLATION;  Surgeon: Particia Nearing, MD;  Location: MAIN OR [facility];  Service: Transplant   ??? IR EMBOLIZATION ORGAN ISCHEMIA, TUMORS, INFAR 06/16/2016    IR EMBOLIZATION ORGAN ISCHEMIA, TUMORS, INFAR 06/16/2016 Ammie Dalton, MD IMG VIR H&V [facility]   ??? PR COLON CA SCRN NOT HI RSK IND N/A 02/27/2015    Procedure: COLOREC CNCR SCR;COLNSCPY NO;  Surgeon: Vonda Antigua, MD;  Location: [facility];  Service: Gastroenterology   ??? PR ENDOSCOPIC ULTRASOUND EXAM N/A 02/27/2015    Procedure: UGI ENDO; W/ENDO ULTRASOUND EXAM INCLUDES ESOPHAGUS, STOMACH, &/OR DUODENUM/JEJUNUM;  Surgeon: Vonda Antigua, MD;  Location: GI PROCEDURES [facility];  Service: Gastroenterology   ??? PR ERCP BALLOON DILATE BILIARY/PANC DUCT/AMPULLA EA N/A 02/10/2017    Procedure: ERCP;WITH TRANS-ENDOSCOPIC BALLOON DILATION OF BILIARY/PANCREATIC DUCT(S) OR OF AMPULLA, INCLUDING SPHINCTERECTOMY, WHEN PERFOREMD,EACH DUCT (62130);  Surgeon: Mayford Knife, MD;  Location: GI PROCEDURES [facility];  Service: Gastroenterology   ??? PR ERCP REMOVE FOREIGN BODY/STENT BILIARY/PANC DUCT N/A 02/10/2017    Procedure: ENDOSCOPIC RETROGRADE CHOLANGIOPANCREATOGRAPHY (ERCP); W/ REMOVAL OF FOREIGN BODY/STENT FROM BILIARY/PANCREATIC DUCT(S);  Surgeon: Mayford Knife, MD;  Location: GI PROCEDURES [facility];  Service: Gastroenterology   ??? PR ERCP REMOVE FOREIGN BODY/STENT BILIARY/PANC DUCT N/A 06/29/2017    Procedure: ENDOSCOPIC RETROGRADE CHOLANGIOPANCREATOGRAPHY (ERCP); W/ REMOVAL OF FOREIGN BODY/STENT FROM BILIARY/PANCREATIC DUCT(S);  Surgeon: Vonda Antigua, MD;  Location: GI PROCEDURES [facility];  Service: Gastroenterology   ??? PR ERCP STENT PLACEMENT BILIARY/PANCREATIC DUCT N/A 11/29/2016    Procedure: ENDOSCOPIC RETROGRADE CHOLANGIOPANCREATOGRAPHY (ERCP); WITH PLACEMENT  OF ENDOSCOPIC STENT INTO BILIARY OR PANCREATIC DUCT;  Surgeon: Chriss Driver, MD;  Location: GI PROCEDURES [facility];  Service: Gastroenterology   ??? PR ERCP STENT PLACEMENT BILIARY/PANCREATIC DUCT N/A 04/26/2017    Procedure: ENDOSCOPIC RETROGRADE CHOLANGIOPANCREATOGRAPHY (ERCP); WITH PLACEMENT OF ENDOSCOPIC STENT INTO BILIARY OR PANCREATIC DUCT;  Surgeon: Mayford Knife, MD;  Location: GI PROCEDURES [facility];  Service: Gastroenterology   ??? PR ERCP,W/REMOVAL STONE,BIL/PANCR DUCTS N/A 11/29/2016    Procedure: ERCP; W/ENDOSCOPIC RETROGRADE REMOVAL OF CALCULUS/CALCULI FROM BILIARY &/OR PANCREATIC DUCTS;  Surgeon: Chriss Driver, MD;  Location: GI PROCEDURES [facility]ORIAL [facility];  Service: Gastroenterology   ??? PR ERCP,W/REMOVAL STONE,BIL/PANCR DUCTS N/A 02/10/2017    Procedure: ERCP; W/ENDOSCOPIC RETROGRADE REMOVAL OF CALCULUS/CALCULI FROM BILIARY &/OR PANCREATIC DUCTS;  Surgeon: Mayford Knife, MD;  Location: GI PROCEDURES [facility];  Service: Gastroenterology   ??? PR ERCP,W/REMOVAL STONE,BIL/PANCR DUCTS N/A 04/26/2017    Procedure: ERCP; W/ENDOSCOPIC RETROGRADE REMOVAL OF CALCULUS/CALCULI FROM BILIARY &/OR PANCREATIC DUCTS;  Surgeon: Mayford Knife, MD;  Location: GI PROCEDURES [facility]ORIAL [facility];  Service: Gastroenterology   ??? PR ERCP,W/REMOVAL STONE,BIL/PANCR DUCTS N/A 06/29/2017    Procedure: ERCP; W/ENDOSCOPIC RETROGRADE REMOVAL OF CALCULUS/CALCULI FROM BILIARY &/OR PANCREATIC DUCTS;  Surgeon: Vonda Antigua, MD;  Location: GI PROCEDURES [facility]ORIAL [facility];  Service: Gastroenterology   ??? PR INSER HEART TEMP PACER ONE CHMBR N/A 10/02/2016    Procedure: Tempoarary Pacemaker Insertion;  Surgeon: Meredith Leeds, MD;  Location: [facility] EP;  Service: Cardiology   ??? PR LAP,DIAGNOSTIC ABDOMEN N/A 01/22/2016    Procedure: Laparoscopy, Abdomen, Peritoneum, & Omentum, Diagnostic, W/Wo Collection Specimen(S) By Brushing Or Washing;  Surgeon: Particia Nearing, MD;  Location: MAIN OR [facility];  Service: Transplant   ??? PR PLACE PERCUT GASTROSTOMY TUBE N/A 11/17/2016    Procedure: UGI ENDO; W/DIRECTED PLCMT PERQ GASTROSTOMY TUBE;  Surgeon: Cletis Athens, MD;  Location: GI PROCEDURES [facility];  Service: Gastroenterology   ??? PR TRACHEOSTOMY, PLANNED N/A 09/29/2016    Procedure: TRACHEOSTOMY PLANNED (SEPART PROC); Surgeon: Katherina Mires, MD;  Location: MAIN OR [facility];  Service: Trauma   ??? PR TRANSCATH INSERT OR REPLACE LEADLESS PM VENTR N/A 10/11/2016    Procedure: Pacemaker Implant/Replace Leadless;  Surgeon: Meredith Leeds, MD;  Location: [facility] EP;  Service: Cardiology   ??? PR TRANSPLANT LIVER,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    Procedure: Liver Allotransplantation; Orthotopic, Partial Or Whole, From Cadaver Or Living Donor, Any Age;  Surgeon: Doyce Loose, MD;  Location: MAIN OR [facility];  Service: Transplant   ??? PR TRANSPLANT,PREP DONOR LIVER, WHOLE N/A 09/15/2016    Procedure: Rogelia Boga Std Prep Cad Donor Whole Liver Gft Prior Tnsplnt,Inc Chole,Diss/Rem Surr Tissu Wo Triseg/Lobe Splt;  Surgeon: Doyce Loose, MD;  Location: MAIN OR [facility];  Service: Transplant   ??? PR UPPER GI ENDOSCOPY,BIOPSY N/A 07/17/2012    Procedure: UGI ENDOSCOPY; WITH BIOPSY, SINGLE OR MULTIPLE;  Surgeon: Alba Destine, MD;  Location: GI PROCEDURES MEMOR[facility];  Service: Gastroenterology   ??? PR UPPER GI ENDOSCOPY,DIAGNOSIS N/A 02/04/2014    Procedure: UGI ENDO, INCLUDE ESOPHAGUS, STOMACH, & DUODENUM &/OR JEJUNUM; DX W/WO COLLECTION SPECIMN, BY BRUSH OR WASH;  Surgeon: Wilburt Finlay, MD;  Location: GI PROCEDURES [facility];  Service: Gastroenterology   ??? PR UPPER GI ENDOSCOPY,LIGAT VARIX N/A 11/05/2013    Procedure: UGI ENDO; W/BAND LIG ESOPH &/OR GASTRIC VARICES;  Surgeon: Wilburt Finlay, MD;  Location: GI PROCEDURES [facility];  Service: Gastroenterology

## 2017-10-10 NOTE — Unmapped (Signed)
Per Dr. Matilde Haymaker and PharmD Nedra Hai patient fine to stop coumadin wife verbalized understanding confirming she would repeat labs tomorrow to recheck CSA level.

## 2017-10-10 NOTE — Unmapped (Signed)
Per wife cardiology would like to discontinue the coumadin.  Reviewed patients chart and do not see a liver transplant need for coumadin.  Sent message to transplant pharmacy and surgeon to confirm this is a safe plan pending their response.

## 2017-10-13 DIAGNOSIS — R490 Dysphonia: Principal | ICD-10-CM

## 2017-10-13 NOTE — Unmapped (Signed)
Current Outpatient Medications on File Prior to Visit   Medication Sig   ??? acetaminophen (TYLENOL) 325 MG tablet Take 1 tablet (325 mg total) by mouth every four (4) hours as needed for pain.   ??? blood sugar diagnostic (FREESTYLE TEST) Strp by Other route Four (4) times a day (before meals and nightly).   ??? blood-glucose meter (GLUCOSE MONITORING KIT) kit Use as instructed   ??? cholecalciferol, vitamin D3, 2,000 unit cap 1 capsule (2,000 Units total) by PEG Tube route daily. (Patient taking differently: Take 1 capsule by mouth daily.)   ??? insulin ASPART (NOVOLOG FLEXPEN U-100 INSULIN) 100 unit/mL injection pen Inject 0.06 mL (6 Units total) under the skin Three (3) times a day before meals. (Patient taking differently: Inject 6 Units under the skin Three (3) times a day before meals. PLUS CORRECTIONS (Additional mealtime per SSI-  151-200= 2 units,  201-250= 4 units, 251-300=add 6 units, 301-350=8 units, 351-400=10 units, >400 =12 units))   ??? insulin glargine (LANTUS) 100 unit/mL (3 mL) injection pen Inject 0.12 mL (12 Units total) under the skin nightly.   ??? lancets Misc 1 each by Miscellaneous route Four (4) times a day (before meals and nightly).   ??? magnesium oxide-Mg AA chelate (MAGNESIUM, AMINO ACID CHELATE,) 133 mg Tab Take 2 tablets by mouth Two (2) times a day.   ??? melatonin 3 mg Tab Take 1 tablet (3 mg total) by mouth nightly.   ??? mirtazapine (REMERON) 15 MG tablet Take 1 tablet (15 mg total) by mouth nightly.   ??? mycophenolate (CELLCEPT) 250 mg capsule Take 2 capsules (500 mg total) by mouth Two (2) times a day.   ??? NEORAL 100 mg capsule 175 mg BID (three 25 mg capsules and one 100 mg capsule two times a day)   ??? NEORAL 25 mg capsule 175 mg BID (three 25 mg capsules and one 100 mg capsule two times a day)   ??? omeprazole (PRILOSEC) 20 MG capsule Take 1 capsule (20 mg total) by mouth daily.   ??? pen needle, diabetic (ULTICARE PEN NEEDLE) 32 gauge x 5/32 Ndle 1 each by Miscellaneous route Four (4) times a day (before meals and nightly).   ??? [EXPIRED] predniSONE (DELTASONE) 10 MG tablet Take 3 tablets (30 mg total) by mouth daily. for 3 days   ??? sildenafil, antihypertensive, (REVATIO) 20 mg tablet Take 40 mg by mouth Three (3) times a day.   ??? warfarin (COUMADIN) 3 MG tablet 4.5 mg M/W/F (1.5 pills) and 3 mg all other days (one pill)     No current facility-administered medications on file prior to visit.

## 2017-10-13 NOTE — Unmapped (Signed)
[facility] SPEECH [address]  OUTPATIENT SPEECH PATHOLOGY  10/13/2017      Patient Name: Jeffrey Ward  Date of Birth:21-Nov-1950  Session Number: 2  Diagnosis:   Encounter Diagnosis   Name Primary?   ??? 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 (prolonged intubation, trach possible etiologies)   05/31/17 Status post in office injection augmentation with Cymetra in the MPR   Significantly complicated history surrounding injection including hemothorax and anemia prior to injection followed by readmission x2 for fall rib pain and intubation for ERCP.  S/p MPR on 09/13/17 for in office MPR injection TTH cymetra 0.33ml.     Date of Evaluation: 10/03/17  Date of Symptom Onset: 09/15/16  Referred by: R. Sherryll Burger, MD      Surgery date (if applicable): 09/15/16  Dates of Certification: expires 01/03/2018    Chief Complaint: dysphagia, dysphonia    Note Type: Treatment Note    ASSESSMENT:     Next Visit Plan: abdominal support of phonation, resonant voice, balanced phonation, easy onset, negative practice, conversation training    OBJECTIVE:  Lower abdominal breathing introduced today. Pt. was 65% accurate for engaging lower abdominal muscles during structured exercises on voiced and voiceless fricatives given maximum verbal and visual cues.  Resonant voice introduced today. Pt. was 50% accurate on /n/ at the word level given maximum cues to reduce laryngeal tension. Tongue trills introduced today. Pt was 40% accurate for balanced phonation given max cues for decreased laryngeal tension and continued airflow. Structured exercises on voiced voiceless minimal pairs instructed today. Patient was 60% accurate for balanced phonation with a forward focus given max cues and modeling; most promise for improved vocal function with /p, b/ minimal pairs.    Stimulability: Pt was somewhat stimulable  Treatment Recommendations: Continue Treatment    PLAN:  SLP Follow-up / Frequency: 1x week for Planned Treatment Duration : 8 sessions    Planned Interventions: Building services engineer, Diaphragmatic breathing/respiratory retraining, Resonant Voice Therapy, Vocal Function Exercises Resonant Voice, Semi-occluded Vocal Tract Exercises, Financial controller, Functional Phonation Tasks    Prognosis:  Good    Negative Prognosis Rationale: Time post onset, Severity of deficits       Positive Prognosis Rationale: Motivation, Good caregiver/family support      Goals:      STG 1: Breathing: Pt. will demonstrate abdominal breathing for support of phonation with 80% accuracy given minimal cues    Time Frame: 3  Duration: months    STG 2: Resonance: Pt. will balance oral/nasal resonance to maximize vocal output with minimal effort/laryngeal hyperfunction with 80% accuracy given minimal cues.        Time Frame: 3    Duration: months    STG 3: Strength/balance: Pt. will strengthen and balance laryngeal musculature for support of connected speech with 80% accuracy given minimal cues.        Time Frame: 3   Duration: months    STG 4: Inflection: Pt. will increase inflection, improve phonatory flexibility, and increase laryngeal control without laryngeal hyperfunction with 80% accuracy given minimal cues        Time Frame: 3   Duration: months      LTG #1: SLP's LTG: At the completion of Tx, Pt. will demonstrate ability to perform voice therapy techniques independently to maintain optimal laryngeal function (+/-).        Time Frame: 3  Duration: months    LTG #  2: Pt's LTG: To clear up vocal quality and have an easier time with projection, to have a lower tone        Time Frame: 3  Duration: months      SUBJECTIVE:  Patient returns with reports that his voice is still wheezy, but doing a little better. He notes that vocal fatigue is still a factor, as well as pain with prolonged voice use. Pt. seen for initiation of voice therapy. Reviewed Pt.'s Dx, established Pt.'s and SLP LTG's and STG's for therapy. Introduced relaxation regimen, abdominal breathing, resonant voice, and laryngeal strengthening and balancing therapy concepts. Patient given at-home practice of abdominal breathing, tongue trills, and resonant voice exercises. Pt. participated well in therapy with high level of motivation apparent and good insight with isolated examples of improved voice in therapy tasks targeting abdominal breathing and resonant voice. Based on performance today, Px for improvement in Tx is good.   Pain?: No      Precautions: Falls    Education Provided: Patient, SLP Plan of Care, Vocal Hygiene    Education Understood: Understanding demonstrated     Session Duration : 45    Today's Charges (noted here with $$):  SLP Treatments  $$ Treatment S/L/V - individual [mins]: 45    I attest that I have reviewed the above information.  Signed: Patricia Nettle, SLP  10/13/2017 3:15 PM

## 2017-10-17 MED FILL — SILDENAFIL CITRATE 2OMG/20MG/TAB: SILDENAFIL CITRATE 2OMG/20MG/TAB | 30 days supply | Qty: 180 | Fill #5

## 2017-10-17 MED FILL — NEORAL/100MG/CAP: NEORAL/100MG/CAP | 30 days supply | Qty: 60 | Fill #4

## 2017-10-17 MED FILL — ON CALL EXPRESS BLOOD GLU/EXPRESS/STRP: ON CALL EXPRESS BLOOD GLU/EXPRESS/STRP | 25 days supply | Qty: 100 | Fill #0

## 2017-10-17 MED FILL — NEORAL/25MG/CAP: NEORAL/25MG/CAP | 30 days supply | Qty: 180 | Fill #4

## 2017-10-17 MED FILL — MYCOPHENOLATE MOFETIL/250MG/CAPS: MYCOPHENOLATE MOFETIL/250MG/CAPS | 30 days supply | Qty: 120 | Fill #7

## 2017-10-17 NOTE — Unmapped (Signed)
Pt request for RX Refill

## 2017-10-18 MED FILL — MIRTAZAPINE/15MG/TABS: MIRTAZAPINE/15MG/TABS | 90 days supply | Qty: 90 | Fill #0

## 2017-10-20 NOTE — Unmapped (Signed)
[facility]) medication reconciliation is completed.

## 2017-10-23 MED ORDER — PEN NEEDLE, DIABETIC 32 GAUGE X 5/32" (4 MM)
1 refills | 0 days | Status: CP
Start: 2017-10-23 — End: 2018-03-11

## 2017-10-24 MED FILL — TRUEPLUS PEN NEEDLE 32 GAUGE X 5/32": 25 days supply | Qty: 100 | Fill #0 | Status: AC

## 2017-10-24 MED FILL — TRUEPLUS PEN NEEDLE 32 GAUGE X 5/32": 25 days supply | Qty: 100 | Fill #0

## 2017-10-27 MED ORDER — ALENDRONATE 70 MG TABLET
ORAL_TABLET | ORAL | 11 refills | 0.00000 days | Status: CP
Start: 2017-10-27 — End: 2018-01-16
  Filled 2017-11-08: qty 4, 28d supply, fill #0

## 2017-10-27 NOTE — Unmapped (Signed)
Per PharmD Nedra Hai due to patient bone density results patient to take Fosamax 70 mg weekly.  Spoke with wife and per her request sent it to [facility] pharmacy.  Also provided education to take 30 minutes prior to any food or drink in the morning, to set up for 30 minutes afterward, and drink a full glass of water with the medication.  She verbalized understanding.  Sent note to PCP to make aware.

## 2017-11-08 MED FILL — ALENDRONATE 70 MG TABLET: 28 days supply | Qty: 4 | Fill #0 | Status: AC

## 2017-11-10 ENCOUNTER — Ambulatory Visit: Admit: 2017-11-10 | Discharge: 2017-11-11 | Payer: MEDICARE

## 2017-11-10 DIAGNOSIS — I2721 Secondary pulmonary arterial hypertension: Principal | ICD-10-CM

## 2017-11-10 DIAGNOSIS — R0600 Dyspnea, unspecified: Secondary | ICD-10-CM

## 2017-11-10 DIAGNOSIS — K766 Portal hypertension: Secondary | ICD-10-CM

## 2017-11-10 NOTE — Unmapped (Signed)
[facility]   Pulmonary Hypertension Program                                               Lattie Haw, MD???Director (Pulmonary)     Denice Bors. Tresa Res, MD--Associate Director (Pulmonary)    Freeman Caldron, MD (Cardiology)     Esmond Plants, DO (Pulmonary)    Marva Panda, RN Nurse Coordinator     Claretha Cooper, RN Nurse Coordinator    [phone] office - [phone] fax          [name]  is a 67 y.o. male  patient referred by Jeffrey Sites, MD for work up and evaluation for pulmonary hypertension.    PAH Treatment History:  Sildenafil 40 mg tid since post liver txplt hospitalization.    Pt describes a history of recent liver txplt in 09/2016.  Post op was noted to have previously unrecognized PAH and RV failure, treated initially with inhaled iloprost while on vent support and then transitioned to sildenafil.  He had a long post op course requiring a stay in rehab of about 2 weeks prior to discharge.   His post op course was further complicated by AKI (on CRRT then had renal recovery), afib, bradycardia requiring pacemaker placement.  CTA negative for PE post op.      No LE edema currently.      He continues on sildenafil 40 tid and is receiving from [facility].      He is no longer on warfarin.    No syncope or near syncope.      Gradually doing more and more physical activity, but would like more intense PT.     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      The patient's current NYHA functional class is Class 2 - comfortable at rest but have symptoms with ordinary physcial activity.     Last echo 04/2017 very reassuring from right heart standpoint.  Plan continue sildenafil at current dose.        Will need repeat echo to reassess.        Plan:          1) continue sildenafil 40 mg tid.      2) next visit in 4 months.      3) echo at next visit.       4) will explore if more intensive PT available in [place].      Subjective:      Past medical history is significant for:   Past Medical History:   Diagnosis Date   ??? Atrial fibrillation (CMS-HCC)    ??? Basal cell carcinoma    ??? Cancer (CMS-HCC)    ??? Cirrhosis (CMS-HCC)    ??? Diabetes (CMS-HCC)    ??? Hepatitis C 07/17/2012   ??? Liver disease    ??? Low back pain 07/17/2012   ??? Varices, esophageal (CMS-HCC)       Family History   Problem Relation Age of Onset   ??? Hypertension Mother    ??? Cirrhosis Neg Hx    ??? Liver cancer Neg Hx    ??? Anemia Neg Hx    ??? Cancer Neg Hx    ??? Diabetes Neg  Hx    ??? Kidney disease Neg Hx    ??? Obesity Neg Hx    ??? Thyroid disease Neg Hx    ??? Osteoporosis Neg Hx    ??? Coronary artery disease Neg Hx    ??? Anesthesia problems Neg Hx    ??? Basal cell carcinoma Neg Hx    ??? Squamous cell carcinoma Neg Hx       Social History     Socioeconomic History   ??? Marital status: Married     Spouse name: Not on file   ??? Number of children: Not on file   ??? Years of education: Not on file   ??? Highest education level: Not on file   Occupational History   ??? Not on file   Social Needs   ??? Financial resource strain: Not on file   ??? Food insecurity:     Worry: Not on file     Inability: Not on file   ??? Transportation needs:     Medical: Not on file     Non-medical: Not on file   Tobacco Use   ??? Smoking status: Never Smoker   ??? Smokeless tobacco: Never Used   ??? Tobacco comment: Smoked in high school for about 5 years.    Substance and Sexual Activity   ??? Alcohol use: No   ??? Drug use: No   ??? Sexual activity: Yes     Partners: Female     Birth control/protection: None   Lifestyle   ??? Physical activity:     Days per week: Not on file     Minutes per session: Not on file   ??? Stress: Not on file   Relationships   ??? Social connections:     Talks on phone: Not on file     Gets together: Not on file     Attends religious service: Not on file     Active member of club or organization: Not on file     Attends meetings of clubs or organizations: Not on file     Relationship status: Not on file   Other Topics Concern   ??? Do you use sunscreen? Not Asked   ??? Tanning bed use? Not Asked   ??? Are you easily burned? Not Asked   ??? Excessive sun exposure? Not Asked   ??? Blistering sunburns? Not Asked   Social History Narrative   ??? Not on file      Past Surgical History:   Procedure Laterality Date   ??? ANKLE SURGERY     ??? BACK SURGERY     ??? BACK SURGERY     ??? CARDIAC SURGERY      pacemaker   ??? CHG US GUIDE, TISSUE ABLATION N/A 01/22/2016    Procedure: ULTRASOUND GUIDANCE FOR, AND MONITORING OF, PARENCHYMAL TISSUE ABLATION;  Surgeon: Particia Nearing, MD;  Location: MAIN OR [facility];  Service: Transplant   ??? IR EMBOLIZATION ORGAN ISCHEMIA, TUMORS, INFAR  06/16/2016    IR EMBOLIZATION ORGAN ISCHEMIA, TUMORS, INFAR 06/16/2016 Ammie Dalton, MD IMG VIR H[facility]   ??? PR COLON CA SCRN NOT HI RSK IND N/A 02/27/2015    Procedure: COLOREC CNCR SCR;COLNSCPY NO;  Surgeon: Vonda Antigua, MD;  Location: GI PROCEDURES MEMORIAL [facility];  Service: Gastroenterology   ??? PR ENDOSCOPIC ULTRASOUND EXAM N/A 02/27/2015    Procedure: UGI ENDO; W/ENDO ULTRASOUND EXAM INCLUDES ESOPHAGUS, STOMACH, &/OR DUODENUM/JEJUNUM;  Surgeon: Vonda Antigua, MD;  Location: GI PROCEDURES MEMORIAL  [facility];  Service: Gastroenterology   ??? PR ERCP BALLOON DILATE BILIARY/PANC DUCT/AMPULLA EA N/A 02/10/2017    Procedure: ERCP;WITH TRANS-ENDOSCOPIC BALLOON DILATION OF BILIARY/PANCREATIC DUCT(S) OR OF AMPULLA, INCLUDING SPHINCTERECTOMY, WHEN PERFOREMD,EACH DUCT ([address]109);  Surgeon: Mayford Knife, MD;  Location: GI PROCEDURES MEMORIAL [facility];  Service: Gastroenterology   ??? PR ERCP REMOVE FOREIGN BODY/STENT BILIARY/PANC DUCT N/A 02/10/2017    Procedure: ENDOSCOPIC RETROGRADE CHOLANGIOPANCREATOGRAPHY (ERCP); W/ REMOVAL OF FOREIGN BODY/STENT FROM BILIARY/PANCREATIC DUCT(S);  Surgeon: Mayford Knife, MD;  Location: GI PROCEDURES MEMORIAL [facility];  Service: Gastroenterology   ??? PR ERCP REMOVE FOREIGN BODY/STENT BILIARY/PANC DUCT N/A 06/29/2017    Procedure: ENDOSCOPIC RETROGRADE CHOLANGIOPANCREATOGRAPHY (ERCP); W/ REMOVAL OF FOREIGN BODY/STENT FROM BILIARY/PANCREATIC DUCT(S);  Surgeon: Vonda Antigua, MD;  Location: GI PROCEDURES MEMORIAL [facility];  Service: Gastroenterology   ??? PR ERCP STENT PLACEMENT BILIARY/PANCREATIC DUCT N/A 11/29/2016    Procedure: ENDOSCOPIC RETROGRADE CHOLANGIOPANCREATOGRAPHY (ERCP); WITH PLACEMENT OF ENDOSCOPIC STENT INTO BILIARY OR PANCREATIC DUCT;  Surgeon: Chriss Driver, MD;  Location: GI PROCEDURES MEMORIAL [facility];  Service: Gastroenterology   ??? PR ERCP STENT PLACEMENT BILIARY/PANCREATIC DUCT N/A 04/26/2017    Procedure: ENDOSCOPIC RETROGRADE CHOLANGIOPANCREATOGRAPHY (ERCP); WITH PLACEMENT OF ENDOSCOPIC STENT INTO BILIARY OR PANCREATIC DUCT;  Surgeon: Mayford Knife, MD;  Location: GI PROCEDURES MEMORIAL [facility];  Service: Gastroenterology   ??? PR ERCP,W/REMOVAL STONE,BIL/PANCR DUCTS N/A 11/29/2016    Procedure: ERCP; W/ENDOSCOPIC RETROGRADE REMOVAL OF CALCULUS/CALCULI FROM BILIARY &/OR PANCREATIC DUCTS;  Surgeon: Chriss Driver, MD;  Location: GI PROCEDURES MEMORIAL [facility];  Service: Gastroenterology   ??? PR ERCP,W/REMOVAL STONE,BIL/PANCR DUCTS N/A 02/10/2017    Procedure: ERCP; W/ENDOSCOPIC RETROGRADE REMOVAL OF CALCULUS/CALCULI FROM BILIARY &/OR PANCREATIC DUCTS;  Surgeon: Mayford Knife, MD;  Location: GI PROCEDURES MEMORIAL [facility];  Service: Gastroenterology   ??? PR ERCP,W/REMOVAL STONE,BIL/PANCR DUCTS N/A 04/26/2017    Procedure: ERCP; W/ENDOSCOPIC RETROGRADE REMOVAL OF CALCULUS/CALCULI FROM BILIARY &/OR PANCREATIC DUCTS;  Surgeon: Mayford Knife, MD;  Location: GI PROCEDURES MEMORIAL [facility];  Service: Gastroenterology   ??? PR ERCP,W/REMOVAL STONE,BIL/PANCR DUCTS N/A 06/29/2017    Procedure: ERCP; W/ENDOSCOPIC RETROGRADE REMOVAL OF CALCULUS/CALCULI FROM BILIARY &/OR PANCREATIC DUCTS;  Surgeon: Vonda Antigua, MD;  Location: GI PROCEDURES MEMORIAL [facility];  Service: Gastroenterology   ??? PR INSER HEART TEMP PACER ONE CHMBR N/A 10/02/2016    Procedure: Tempoarary Pacemaker Insertion;  Surgeon: Meredith Leeds, MD;  Location: [facility];  Service: Cardiology   ??? PR LAP,DIAGNOSTIC ABDOMEN N/A 01/22/2016    Procedure: Laparoscopy, Abdomen, Peritoneum, & Omentum, Diagnostic, W/Wo Collection Specimen(S) By Brushing Or Washing;  Surgeon: Particia Nearing, MD;  Location: MAIN OR [facility];  Service: Transplant   ??? PR PLACE PERCUT GASTROSTOMY TUBE N/A 11/17/2016    Procedure: UGI ENDO; W/DIRECTED PLCMT PERQ GASTROSTOMY TUBE;  Surgeon: Cletis Athens, MD;  Location: GI PROCEDURES MEMORIAL [facility];  Service: Gastroenterology   ??? PR TRACHEOSTOMY, PLANNED N/A 09/29/2016    Procedure: TRACHEOSTOMY PLANNED (SEPART PROC);  Surgeon: Katherina Mires, MD;  Location: MAIN OR [facility];  Service: Trauma   ??? PR TRANSCATH INSERT OR REPLACE LEADLESS PM VENTR N/A 10/11/2016    Procedure: Pacemaker Implant/Replace Leadless;  Surgeon: Meredith Leeds, MD;  Location: [facility];  Service: Cardiology   ??? PR TRANSPLANT LIVER,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    Procedure: Liver Allotransplantation; Orthotopic, Partial Or Whole, From Cadaver Or Living Donor, Any Age;  Surgeon: Doyce Loose, MD;  Location: MAIN OR [facility] [address];  Service: Transplant   ??? PR TRANSPLANT,PREP DONOR LIVER, WHOLE N/A 09/15/2016  Procedure: Rogelia Boga Std Prep Cad Donor Whole Liver Gft Prior Tnsplnt,Inc Chole,Diss/Rem Surr Tissu Wo Triseg/Lobe Splt;  Surgeon: Doyce Loose, MD;  Location: MAIN OR [facility];  Service: Transplant   ??? PR UPPER GI ENDOSCOPY,BIOPSY N/A 07/17/2012    Procedure: UGI ENDOSCOPY; WITH BIOPSY, SINGLE OR MULTIPLE;  Surgeon: Alba Destine, MD;  Location: GI PROCEDURES MEMORIAL [facility];  Service: Gastroenterology   ??? PR UPPER GI ENDOSCOPY,DIAGNOSIS N/A 02/04/2014    Procedure: UGI ENDO, INCLUDE ESOPHAGUS, STOMACH, & DUODENUM &/OR JEJUNUM; DX W/WO COLLECTION SPECIMN, BY BRUSH OR WASH;  Surgeon: Wilburt Finlay, MD;  Location: GI PROCEDURES MEMORIAL [facility];  Service: Gastroenterology   ??? PR UPPER GI ENDOSCOPY,LIGAT VARIX N/A 11/05/2013    Procedure: UGI ENDO; W/BAND LIG ESOPH &/OR GASTRIC VARICES;  Surgeon: Wilburt Finlay, MD;  Location: GI PROCEDURES MEMORIAL [facility];  Service: Gastroenterology        Objective:   Objective   Review of systems is significant for:     As stated in the HPI, remainder of 12 system review is negative.      No Known Allergies   Current Outpatient Medications   Medication Sig Dispense Refill   ??? acetaminophen (TYLENOL) 325 MG tablet Take 1 tablet (325 mg total) by mouth every four (4) hours as needed for pain. 100 tablet 2   ??? alendronate (FOSAMAX) 70 MG tablet Take 1 tablet (70 mg total) by mouth every seven (7) days. 4 tablet 11   ??? cholecalciferol, vitamin D3, 2,000 unit cap 1 capsule (2,000 Units total) by PEG Tube route daily. (Patient taking differently: Take 1 capsule by mouth daily.) 30 capsule 11   ??? docusate sodium (COLACE) 100 MG capsule TAKE 1 CAPSULE BY MOUTH TWICE DAILY 60 capsule 0   ??? insulin ASPART (NOVOLOG FLEXPEN U-100 INSULIN) 100 unit/mL injection pen Inject 0.06 mL (6 Units total) under the skin Three (3) times a day before meals. (Patient taking differently: Inject 6 Units under the skin Three (3) times a day before meals. PLUS CORRECTIONS (Additional mealtime per SSI-  151-200= 2 units,  201-250= 4 units, 251-300=add 6 units, 301-350=8 units, 351-400=10 units, >400 =12 units)) 4 mL 11   ??? insulin glargine (LANTUS) 100 unit/mL (3 mL) injection pen INJECT 12 UNITS SUBCUTANEOUSLY (UNDER SKIN) NIGHTLY 15 mL 10   ??? magnesium oxide-Mg AA chelate (MAGNESIUM, AMINO ACID CHELATE,) 133 mg Tab Take 2 tablets by mouth Two (2) times a day. 100 tablet PRN   ??? melatonin 3 mg Tab Take 1 tablet (3 mg total) by mouth nightly. 30 tablet 0   ??? mirtazapine (REMERON) 15 MG tablet TAKE 1 TABLET BY MOUTH NIGHTLY 90 tablet 3   ??? mycophenolate (CELLCEPT) 250 mg capsule TAKE 2 CAPSULES (500MG ) BY MOUTH TWICE DAILY 120 capsule 7   ??? NEORAL 100 mg capsule TAKE 1 CAPSULE BY MOUTH TWICE DAILY WITH 3 OF THE 25MG  CAPSULES 60 each 11   ??? NEORAL 25 mg capsule TAKE 3 CAPSULES BY MOUTH TWICE DAILY WITH 100MG  CAPSULE 180 each 11   ??? omeprazole (PRILOSEC) 20 MG capsule Take 1 capsule (20 mg total) by mouth daily. 30 capsule 1   ??? polyethylene glycol (GLYCOLAX) 17 gram/dose powder MIX 17 GRAMS (USE MEASURE LINE IN CAP) IN 4-8 OUNCES OF WATER, JUICE, SODA, COFFEE, OR TEA AND DRINK ONCE DAILY 527 g 0   ??? sildenafil, antihypertensive, (REVATIO) 20 mg tablet Take 40 mg by mouth Three (3) times a day.     ??? blood  sugar diagnostic Strp USE AS DIRECTED TO CHECK BLOOD SUGAR 4 TIMES DAILY ( BEFORE MEALS AND NIGHTLY ) 100 each 7   ??? blood-glucose meter (GLUCOSE MONITORING KIT) kit Use as instructed 1 each 0   ??? blood-glucose meter Misc USE AS DIRECTED TO CHECK BLOOD SUGAR 1 each 0   ??? lancets 30 gauge Misc USE AS DIRECTED TO CHECK BLOOD SUGAR 4 TIMES DAILY ( BEFORE MEALS AND NIGHTLY ) 100 each 11   ??? lancets Misc 1 each by Miscellaneous route Four (4) times a day (before meals and nightly). 100 each 11   ??? lancing device Misc USE AS DIRECTED TO CHECK BLOOD SUGAR 1 each 0   ??? pen needle, diabetic 32 gauge x 5/32 Ndle USE WITH INSULIN PEN 4 TIMES DAILY AS DIRECTED 100 each 1     No current facility-administered medications for this visit.         Physical Exam:    weight is 69.9 kg (154 lb). His oral temperature is 36.7 ??C (98 ??F). His blood pressure is 116/66 and his pulse is 88. His respiration is 16 and oxygen saturation is 99%.   General Appearance:    Alert, cooperative, no distress, appears stated age   Head:    Normocephalic, without obvious abnormality, atraumatic   Eyes:    PERRL, conjunctiva clear, EOM's intact    Oropharynx:   No lesions   Neck:   Supple, trachea midline, no adenopathy;    No JVD   Lungs:     Good breath sounds b/l, no crackles or wheezes, respirations unlabored   Chest Wall:    No tenderness or deformity    Heart:    Regular rate and rhythm, S1 and S2 normal, no murmur, rub   or gallop   Abdomen:     Soft, non-tender, normal bowel sounds, no masses, no organomegaly.  + G tube in place   Extremities:   Extremities normal, no cyanosis, clubbing, or edema   Pulses:   2+ and symmetric all extremities   Skin:   No rashes or lesions   Lymph nodes:   No cervical or supraclavicular adenopathy   Neurologic:   No focal neurological deficits       Diagnostic Review:    Echo 04/21/17:    Left Ventricle The left ventricle is normal in size with normal wall thickness.   The left ventricular systolic function is normal.   The ejection fraction is visually estimated at 65 to 70%.   There is impaired left ventricular relaxation with normal left ventricular filling pressure consistent with grade I diastolic dysfunction.   Mitral Valve The mitral valve leaflets are normal, with normal leaflet mobility.   There is no significant mitral regurgitation by color and continuous wave Doppler imaging.   Left Atrium The left atrium is dilated.   Aortic Valve The aortic valve is poorly visualized with probably normal excursion.   There is no significant aortic regurgitation by color and continuous wave Doppler imaging.  There is no evidence of a significant transvalvular gradient.   - Peak transvalvular velocity: 1 m/sec   Ascending Aorta The aorta is normal in size in the visualized segments.   Pulmonary Artery The pulmonary artery is not well visualized.   Pulmonic Valve The pulmonic valve is poorly visualized, but probably normal.   There is no significant pulmonic regurgitation by color and continuous wave Doppler imaging.   There is no evidence of a significant transvalvular gradient.   -  Peak transvalvular velocity: 0.9 m/sec   Right Ventricle The right ventricle is normal in size with normal wall thickness.   The right ventricular systolic function is normal.   Tricuspid Valve The tricuspid valve leaflets are normal, with normal leaflet mobility.   There is trivial tricuspid regurgitation by color and continuous wave Doppler imaging.   - The Doppler signal is suboptimal and pulmonary artery systolic pressure cannot be accurately estimated.   Right Atrium The right atrium is normal in size.   IVC/SVC The IVC diameter is <=21 mm with >50% decrease in size with inspiration suggesting normal right atrial pressure (0-5 mm Hg).   Pericardium There is no evidence of a significant pericardial effusion.         V/Q scan 01/11/17:    Impression     Normal ventilation and perfusion scan.         CT scan PE protocol performed on 09/2016 post op revealed:    Impression     No CT evidence for acute pulmonary embolism, as clinically questioned.    Moderate bilateral pleural effusions similar to prior CT dated 01/30/2016 with slightly increased atelectasis of the adjacent lung parenchyma in the bilateral lower lobes, left greater than right.    Addendum (Dr.Altun dictating 09/27/2016 08:35 AM) Focal contour bulging of the liver adjacent to the diaphragm and right kidney is noted which is not well evaluated due to the presence of streak artifacts.          Pulmonary Function Test performed on 11/24/15 show:            Consistent with normal spirometry and normal DLCO    6 Minute Walk Test performed on 01/06/17:         Echo performed on 09/27/16:    Echocardiographic Findings     Left Ventricle The left ventricle is normal in size with normal wall thickness.   The left ventricular systolic function is normal.   The ejection fraction is visually estimated at 60 to 65%.   Left ventricular diastolic function cannot be accurately assessed.      Left Atrium The left atrium is normal in size.      Right Ventricle The right ventricle is moderately dilated.   The right ventricular systolic function is decreased.      Tricuspid Valve The tricuspid valve is not well visualized.   There is mild tricuspid regurgitation by color and continuous wave Doppler imaging.   The regurgitation jet is directed toward the septum.   The pulmonary artery systolic pressure is severely elevated.   - Peak tricuspid regurgitant velocity: 4.6 m/sec  - Estimated pulmonary artery systolic pressure: 80 + RA pressure mmHg      Right Atrium The right atrium is poorly visualized but appears dilated      IVC/SVC Mechanical ventilation precludes the ability to accurately assess right atrial pressure.      Pericardium There is a small anterior pericardial effusion.          [facility] ICU Catheterization performed on date 09/22/16:     Wedge pressure initially looks to be 15-16 but with further balloon deflation this clearly decreases further to 8 mmHg with very good quality tracing. At that time mPAP 50, CVP 9, CO by CCO measurements 4.1/CI 2.2, PVR approximating 10 WU.

## 2017-11-10 NOTE — Unmapped (Signed)
[facility] Refill Coordination Note    Specialty Medication(s) to be Shipped:   Transplant: mycophenolate mofetil 250mg , Neoral 25mg  and Neoral 100mg   Other medication(s) to be shipped: Mag plus Protein 133mg  & Sildenafil 20mg      Jeffrey Ward, DOB: 1950-04-01  Phone: [phone] (home)   Shipping Address: [address]  [place] [address]    All above HIPAA information was verified with patient.     Completed refill call assessment today to schedule patient's medication shipment from the [facility] [phone]).       Specialty medication(s) and dose(s) confirmed: Regimen is correct and unchanged.   Changes to medications: Vick reports no changes reported at this time.  Changes to insurance: No  Questions for the pharmacist: No    The patient will receive a drug information handout for each medication shipped and additional FDA Medication Guides as required.      DISEASE/MEDICATION-SPECIFIC INFORMATION        N/A    ADHERENCE     Medication Adherence    Patient reported X missed doses in the last month:  0  Support network for adherence:  family member         MEDICARE PART B DOCUMENTATION     Mycophenolate mofetil 250mg : Patient has 10 days worth of capsules on hand.  Neoral 25mg : Patient has 10 days worth of capsules on hand.  Neoral 100mg : Patient has 10 days worth of capsules on hand.    SHIPPING     Shipping address confirmed in Epic.     Delivery Scheduled: Yes, Expected medication delivery date: 11/17/2017 via UPS or courier.     Jeffrey Ward   [facility] Specialty Technician

## 2017-11-16 MED FILL — NEORAL 25 MG CAPSULE: 30 days supply | Qty: 180 | Fill #0

## 2017-11-16 MED FILL — MYCOPHENOLATE MOFETIL 250 MG CAPSULE: 30 days supply | Qty: 120 | Fill #0

## 2017-11-16 MED FILL — NEORAL 25 MG CAPSULE: 30 days supply | Qty: 180 | Fill #0 | Status: AC

## 2017-11-16 MED FILL — MG-PLUS-PROTEIN 133 MG TABLET: 25 days supply | Qty: 100 | Fill #0 | Status: AC

## 2017-11-16 MED FILL — NEORAL 100 MG CAPSULE: 30 days supply | Qty: 60 | Fill #0 | Status: AC

## 2017-11-16 MED FILL — NEORAL 100 MG CAPSULE: 30 days supply | Qty: 60 | Fill #0

## 2017-11-16 MED FILL — MYCOPHENOLATE MOFETIL 250 MG CAPSULE: 30 days supply | Qty: 120 | Fill #0 | Status: AC

## 2017-11-16 MED FILL — MG-PLUS-PROTEIN 133 MG TABLET: ORAL | 25 days supply | Qty: 100 | Fill #0

## 2017-11-17 ENCOUNTER — Ambulatory Visit
Admit: 2017-11-17 | Discharge: 2017-11-17 | Payer: MEDICARE | Attending: Physical Medicine & Rehabilitation | Primary: Physical Medicine & Rehabilitation

## 2017-11-17 ENCOUNTER — Ambulatory Visit: Admit: 2017-11-17 | Discharge: 2017-11-17 | Payer: MEDICARE

## 2017-11-17 DIAGNOSIS — Z944 Liver transplant status: Secondary | ICD-10-CM

## 2017-11-17 DIAGNOSIS — G8929 Other chronic pain: Secondary | ICD-10-CM

## 2017-11-17 DIAGNOSIS — M5440 Lumbago with sciatica, unspecified side: Secondary | ICD-10-CM

## 2017-11-17 DIAGNOSIS — M5441 Lumbago with sciatica, right side: Principal | ICD-10-CM

## 2017-11-17 DIAGNOSIS — G629 Polyneuropathy, unspecified: Principal | ICD-10-CM

## 2017-11-17 NOTE — Unmapped (Addendum)
[facility] Physical Medicine and Rehabilitation     Patient Name:Jeffrey Ward  MRN: [id number]  DOB: 12-28-1950  Age: 67 y.o.     ASSESSMENT & PLAN:     11/17/17     DIAGNOSIS: right low back pain- myofascial, facet mediated and likely right SI joint pain as well. H/o fusion with spinal stenosis  TREATMENT PLAN:   XR today to f/u for any acute changes. If no changes will call him to come in for TPI right lumbar psp. Depending on degree of relief may refer for facet MBBs if needed. May consider SI inj.    PROGNOSIS: fair  NEXT STEPS/FOLLOW UP: call after XR    Comprehensive patient education performed today explaining that the care plan will integrate multimodal activity-based rehabilitation techniques to enhance recovery, reduce pain, and facilitate functionality. Risks, benefits, and instructions for all medications prescribed / treatments offered  reviewed extensively with patient, who expresses understanding.     A consultation report has been transmitted to the consult requesting physician.      Addendum  XR lumbar spine  Similar appearance of posterior spinal hardware spanning T10-L2 and L5-S1. Lucency surrounding the left S1 screw may reflect loosening, attention on follow-up.  Similar height loss at T12 compression fracture.  Multilevel mid to lower lumbar spondylosis, most prominent at L5-S1 with similar grade 2 anterolisthesis of L5-S1.    Will have pt return for TPI trial and discuss XR findings and next steps.    SUBJECTIVE:     Wife assisting with history.    Chief Complaint:    Back Pain and Leg Pain    History of Present Illness:   [name] is a 67 y.o. year old male being evaluated in consultation at the request of Dr. Sherlon Handing for Back Pain and Leg Pain  58M with h/o liver transplant July 2017 presents for chronic low back pain. This worsened after hospitalization last year. He did PT for a few months.  He tries to stay active but walking makes pain worse.  Pain is mostly right and occasionally on left. He notes history of spine fractures and fusion in th 1990s. He does have numbness of the right leg to the ankle. He has bilateral paresthesias in his feet symmetrically due to polyneuropathy from diabetes (states he had EMG to confirm). No new weakness in legs.  Of note he has a h/o right foot fx and chronic loss of right toe extension strength.    Symptom Location: right low back pain  Symptom Character: tingling, numbness and aching  Symptom Onset/Mechanism: chronic (>/= 3 months)  Temporal Pattern: constant  NRS Pain Intensity: 6  Aggravating Factors: walking  Alleviating Factors: rest  Night Pain: yes   Unintended Weight Loss: yes (related to transplant)  Fever/Infection:no  Neuromotor Function: motor weakness - (no),  gait/coordination disturbance - (yes) falls - last was a few months ago, loss of bowel or bladder control - (no), saddle anesthesia - (no)     Prior Interventions/Modalities:  Tylenol 500mg  prn    Lumbar injections in the past- 22yrs ago, helped about a week.    Prior Diagnostics:  MRI lumbar spine 9/18  -Unchanged compression deformity of T12. Stable grade 2 anterolisthesis of L5 on S1. Posterior fixation hardware in place without MRI evidence of complication.  -Mild canal and neural foraminal stenosis at L4-5 and L5-S1.    Current Outpatient Medications   Medication Sig Dispense Refill   ??? acetaminophen (TYLENOL) 325 MG tablet Take 1 tablet (  325 mg total) by mouth every four (4) hours as needed for pain. 100 tablet 2   ??? alendronate (FOSAMAX) 70 MG tablet Take 1 tablet (70 mg total) by mouth every seven (7) days. 4 tablet 11   ??? blood sugar diagnostic Strp USE AS DIRECTED TO CHECK BLOOD SUGAR 4 TIMES DAILY ( BEFORE MEALS AND NIGHTLY ) 100 each 7   ??? blood-glucose meter (GLUCOSE MONITORING KIT) kit Use as instructed 1 each 0   ??? blood-glucose meter Misc USE AS DIRECTED TO CHECK BLOOD SUGAR 1 each 0   ??? cholecalciferol, vitamin D3, 2,000 unit cap 1 capsule (2,000 Units total) by PEG Tube route daily. (Patient taking differently: Take 1 capsule by mouth daily.) 30 capsule 11   ??? docusate sodium (COLACE) 100 MG capsule TAKE 1 CAPSULE BY MOUTH TWICE DAILY 60 capsule 0   ??? insulin ASPART (NOVOLOG FLEXPEN U-100 INSULIN) 100 unit/mL injection pen Inject 0.06 mL (6 Units total) under the skin Three (3) times a day before meals. (Patient taking differently: Inject 6 Units under the skin Three (3) times a day before meals. PLUS CORRECTIONS (Additional mealtime per SSI-  151-200= 2 units,  201-250= 4 units, 251-300=add 6 units, 301-350=8 units, 351-400=10 units, >400 =12 units)) 4 mL 11   ??? insulin glargine (LANTUS) 100 unit/mL (3 mL) injection pen INJECT 12 UNITS SUBCUTANEOUSLY (UNDER SKIN) NIGHTLY 15 mL 10   ??? lancets 30 gauge Misc USE AS DIRECTED TO CHECK BLOOD SUGAR 4 TIMES DAILY ( BEFORE MEALS AND NIGHTLY ) 100 each 11   ??? lancets Misc 1 each by Miscellaneous route Four (4) times a day (before meals and nightly). 100 each 11   ??? lancing device Misc USE AS DIRECTED TO CHECK BLOOD SUGAR 1 each 0   ??? magnesium oxide-Mg AA chelate (MAGNESIUM, AMINO ACID CHELATE,) 133 mg Tab Take 2 tablets by mouth Two (2) times a day. 100 tablet PRN   ??? melatonin 3 mg Tab Take 1 tablet (3 mg total) by mouth nightly. 30 tablet 0   ??? mirtazapine (REMERON) 15 MG tablet TAKE 1 TABLET BY MOUTH NIGHTLY 90 tablet 3   ??? mycophenolate (CELLCEPT) 250 mg capsule TAKE 2 CAPSULES (500MG ) BY MOUTH TWICE DAILY 120 capsule 7   ??? NEORAL 100 mg capsule TAKE 1 CAPSULE BY MOUTH TWICE DAILY WITH 3 OF THE 25MG  CAPSULES 60 each 11   ??? NEORAL 25 mg capsule TAKE 3 CAPSULES BY MOUTH TWICE DAILY WITH 100MG  CAPSULE 180 each 11   ??? omeprazole (PRILOSEC) 20 MG capsule Take 1 capsule (20 mg total) by mouth daily. 30 capsule 1   ??? pen needle, diabetic 32 gauge x 5/32 Ndle USE WITH INSULIN PEN 4 TIMES DAILY AS DIRECTED 100 each 1   ??? polyethylene glycol (GLYCOLAX) 17 gram/dose powder MIX 17 GRAMS (USE MEASURE LINE IN CAP) IN 4-8 OUNCES OF WATER, JUICE, SODA, COFFEE, OR TEA AND DRINK ONCE DAILY 527 g 0   ??? sildenafil, antihypertensive, (REVATIO) 20 mg tablet Take 40 mg by mouth Three (3) times a day.       No current facility-administered medications for this visit.        Allergies:   Patient has no known allergies.    Past Medical / Surgical History:     Past Medical History:   Diagnosis Date   ??? Atrial fibrillation (CMS-HCC)    ??? Basal cell carcinoma    ??? Cancer (CMS-HCC)    ??? Cirrhosis (CMS-HCC)    ???  Diabetes (CMS-HCC)    ??? Hepatitis C 07/17/2012   ??? Liver disease    ??? Low back pain 07/17/2012   ??? Varices, esophageal (CMS-HCC)        Past Surgical History:   Procedure Laterality Date   ??? ANKLE SURGERY     ??? BACK SURGERY     ??? BACK SURGERY     ??? CARDIAC SURGERY      pacemaker   ??? CHG US GUIDE, TISSUE ABLATION N/A 01/22/2016    Procedure: ULTRASOUND GUIDANCE FOR, AND MONITORING OF, PARENCHYMAL TISSUE ABLATION;  Surgeon: Particia Nearing, MD;  Location: MAIN OR [facility];  Service: Transplant   ??? IR EMBOLIZATION ORGAN ISCHEMIA, TUMORS, INFAR  06/16/2016    IR EMBOLIZATION ORGAN ISCHEMIA, TUMORS, INFAR 06/16/2016 Ammie Dalton, MD IMG VIR H&V [facility]   ??? PR COLON CA SCRN NOT HI RSK IND N/A 02/27/2015    Procedure: COLOREC CNCR SCR;COLNSCPY NO;  Surgeon: Vonda Antigua, MD;  Location: GI PROCEDURES MEMORIAL [facility];  Service: Gastroenterology   ??? PR ENDOSCOPIC ULTRASOUND EXAM N/A 02/27/2015    Procedure: UGI ENDO; W/ENDO ULTRASOUND EXAM INCLUDES ESOPHAGUS, STOMACH, &/OR DUODENUM/JEJUNUM;  Surgeon: Vonda Antigua, MD;  Location: GI PROCEDURES MEMORIAL [facility];  Service: Gastroenterology   ??? PR ERCP BALLOON DILATE BILIARY/PANC DUCT/AMPULLA EA N/A 02/10/2017    Procedure: ERCP;WITH TRANS-ENDOSCOPIC BALLOON DILATION OF BILIARY/PANCREATIC DUCT(S) OR OF AMPULLA, INCLUDING SPHINCTERECTOMY, WHEN PERFOREMD,EACH DUCT (16109);  Surgeon: Mayford Knife, MD;  Location: [facility] PROCEDURES [facility]IAL [facility];  Service: Gastroenterology   ??? PR ERCP REMOVE FOREIGN BODY/STENT BILIARY/PANC DUCT N/A 02/10/2017 Procedure: ENDOSCOPIC RETROGRADE CHOLANGIOPANCREATOGRAPHY (ERCP); W/ REMOVAL OF FOREIGN BODY/STENT FROM BILIARY/PANCREATIC DUCT(S);  Surgeon: Mayford Knife, MD;  Location: GI PROCEDURES MEMORIAL [facility];  Service: Gastroenterology   ??? PR ERCP REMOVE FOREIGN BODY/STENT BILIARY/PANC DUCT N/A 06/29/2017    Procedure: ENDOSCOPIC RETROGRADE CHOLANGIOPANCREATOGRAPHY (ERCP); W/ REMOVAL OF FOREIGN BODY/STENT FROM BILIARY/PANCREATIC DUCT(S);  Surgeon: Vonda Antigua, MD;  Location: GI PROCEDURES MEMORIAL [facility];  Service: Gastroenterology   ??? PR ERCP STENT PLACEMENT BILIARY/PANCREATIC DUCT N/A 11/29/2016    Procedure: ENDOSCOPIC RETROGRADE CHOLANGIOPANCREATOGRAPHY (ERCP); WITH PLACEMENT OF ENDOSCOPIC STENT INTO BILIARY OR PANCREATIC DUCT;  Surgeon: Chriss Driver, MD;  Location: GI PROCEDURES MEMORIAL [facility];  Service: Gastroenterology   ??? PR ERCP STENT PLACEMENT BILIARY/PANCREATIC DUCT N/A 04/26/2017    Procedure: ENDOSCOPIC RETROGRADE CHOLANGIOPANCREATOGRAPHY (ERCP); WITH PLACEMENT OF ENDOSCOPIC STENT INTO BILIARY OR PANCREATIC DUCT;  Surgeon: Mayford Knife, MD;  Location: GI PROCEDURES MEMORIAL [facility];  Service: Gastroenterology   ??? PR ERCP,W/REMOVAL STONE,BIL/PANCR DUCTS N/A 11/29/2016    Procedure: ERCP; W/ENDOSCOPIC RETROGRADE REMOVAL OF CALCULUS/CALCULI FROM BILIARY &/OR PANCREATIC DUCTS;  Surgeon: Chriss Driver, MD;  Location: GI PROCEDURES MEMORIAL [facility];  Service: Gastroenterology   ??? PR ERCP,W/REMOVAL STONE,BIL/PANCR DUCTS N/A 02/10/2017    Procedure: ERCP; W/ENDOSCOPIC RETROGRADE REMOVAL OF CALCULUS/CALCULI FROM BILIARY &/OR PANCREATIC DUCTS;  Surgeon: Mayford Knife, MD;  Location: GI PROCEDURES MEMORIAL [facility];  Service: Gastroenterology   ??? PR ERCP,W/REMOVAL STONE,BIL/PANCR DUCTS N/A 04/26/2017    Procedure: ERCP; W/ENDOSCOPIC RETROGRADE REMOVAL OF CALCULUS/CALCULI FROM BILIARY &/OR PANCREATIC DUCTS;  Surgeon: Mayford Knife, MD;  Location: GI PROCEDURES MEMORIAL [facility];  Service: Gastroenterology   ??? PR ERCP,W/REMOVAL STONE,BIL/PANCR DUCTS N/A 06/29/2017    Procedure: ERCP; W/ENDOSCOPIC RETROGRADE REMOVAL OF CALCULUS/CALCULI FROM BILIARY &/OR PANCREATIC DUCTS;  Surgeon: Vonda Antigua, MD;  Location: GI PROCEDURES MEMORIAL [facility];  Service: Gastroenterology   ??? PR INSER HEART TEMP PACER ONE CHMBR N/A 10/02/2016  Procedure: Tempoarary Pacemaker Insertion;  Surgeon: Meredith Leeds, MD;  Location: [facility];  Service: Cardiology   ??? PR LAP,DIAGNOSTIC ABDOMEN N/A 01/22/2016    Procedure: Laparoscopy, Abdomen, Peritoneum, & Omentum, Diagnostic, W/Wo Collection Specimen(S) By Brushing Or Washing;  Surgeon: Particia Nearing, MD;  Location: MAIN OR [facility];  Service: Transplant   ??? PR PLACE PERCUT GASTROSTOMY TUBE N/A 11/17/2016    Procedure: UGI ENDO; W/DIRECTED PLCMT PERQ GASTROSTOMY TUBE;  Surgeon: Cletis Athens, MD;  Location: GI PROCEDURES MEMORIAL [facility];  Service: Gastroenterology   ??? PR TRACHEOSTOMY, PLANNED N/A 09/29/2016    Procedure: TRACHEOSTOMY PLANNED (SEPART PROC);  Surgeon: Katherina Mires, MD;  Location: MAIN OR [facility];  Service: Trauma   ??? PR TRANSCATH INSERT OR REPLACE LEADLESS PM VENTR N/A 10/11/2016    Procedure: Pacemaker Implant/Replace Leadless;  Surgeon: Meredith Leeds, MD;  Location: [facility];  Service: Cardiology   ??? PR TRANSPLANT LIVER,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    Procedure: Liver Allotransplantation; Orthotopic, Partial Or Whole, From Cadaver Or Living Donor, Any Age;  Surgeon: Doyce Loose, MD;  Location: MAIN OR [facility];  Service: Transplant   ??? PR TRANSPLANT,PREP DONOR LIVER, WHOLE N/A 09/15/2016    Procedure: Rogelia Boga Std Prep Cad Donor Whole Liver Gft Prior Tnsplnt,Inc Chole,Diss/Rem Surr Tissu Wo Triseg/Lobe Splt;  Surgeon: Doyce Loose, MD;  Location: MAIN OR [facility];  Service: Transplant   ??? PR UPPER GI ENDOSCOPY,BIOPSY N/A 07/17/2012    Procedure: UGI ENDOSCOPY; WITH BIOPSY, SINGLE OR MULTIPLE;  Surgeon: Alba Destine, MD;  Location: GI PROCEDURES MEMORIAL [facility];  Service: Gastroenterology   ??? PR UPPER GI ENDOSCOPY,DIAGNOSIS N/A 02/04/2014    Procedure: UGI ENDO, INCLUDE ESOPHAGUS, STOMACH, & DUODENUM &/OR JEJUNUM; DX W/WO COLLECTION SPECIMN, BY BRUSH OR WASH;  Surgeon: Wilburt Finlay, MD;  Location: GI PROCEDURES MEMORIAL [facility];  Service: Gastroenterology   ??? PR UPPER GI ENDOSCOPY,LIGAT VARIX N/A 11/05/2013    Procedure: UGI ENDO; W/BAND LIG ESOPH &/OR GASTRIC VARICES;  Surgeon: Wilburt Finlay, MD;  Location: GI PROCEDURES [facility]IAL [facility];  Service: Gastroenterology       Social History     Socioeconomic History   ??? Marital status: Married     Spouse name: None   ??? Number of children: None   ??? Years of education: None   ??? Highest education level: None   Occupational History   ??? None   Social Needs   ??? Financial resource strain: None   ??? Food insecurity:     Worry: None     Inability: None   ??? Transportation needs:     Medical: None     Non-medical: None   Tobacco Use   ??? Smoking status: Never Smoker   ??? Smokeless tobacco: Never Used   ??? Tobacco comment: Smoked in high school for about 5 years.    Substance and Sexual Activity   ??? Alcohol use: No   ??? Drug use: No   ??? Sexual activity: Yes     Partners: Female     Birth control/protection: None   Lifestyle   ??? Physical activity:     Days per week: None     Minutes per session: None   ??? Stress: None   Relationships   ??? Social connections:     Talks on phone: None     Gets together: None     Attends religious service: None     Active member of club or organization: None  Attends meetings of clubs or organizations: None     Relationship status: None   Other Topics Concern   ??? Do you use sunscreen? Not Asked   ??? Tanning bed use? Not Asked   ??? Are you easily burned? Not Asked   ??? Excessive sun exposure? Not Asked   ??? Blistering sunburns? Not Asked   Social History Narrative   ??? None       Family History   Problem Relation Age of Onset   ??? Hypertension Mother    ??? Cirrhosis Neg Hx    ??? Liver cancer Neg Hx    ??? Anemia Neg Hx    ??? Cancer Neg Hx    ??? Diabetes Neg Hx    ??? Kidney disease Neg Hx    ??? Obesity Neg Hx    ??? Thyroid disease Neg Hx    ??? Osteoporosis Neg Hx    ??? Coronary artery disease Neg Hx    ??? Anesthesia problems Neg Hx    ??? Basal cell carcinoma Neg Hx    ??? Squamous cell carcinoma Neg Hx        For any of the above entries which indicate no records on file, the patient reports no relevant history.                 Review of Systems:   Review of Systems was completed through a 10 organ system review and is listed in the chart.  Pertinent positives are noted in HPI or flowsheet and otherwise negative.  Patient has been instructed to followup with PCP or appropriate specialist for symptoms outside the purview of this speciality.    OBJECTIVE:     Vitals:     BP 140/96  - Pulse 77  - Temp 36.7 ??C (98.1 ??F)  - Ht 172.7 cm (5' 7.99)  - Wt 69.9 kg (154 lb)  - BMI 23.42 kg/m??     The above medications, allergies, history, and ROS, and vitals have been reviewed.    Physical Exam:   GEN: alert and oriented, no apparent distress  HEENT: normocephalic, atraumatic, anicteric, moist mucous membranes  CV: normal heart rate  PULM: normal work of breathing  GI: nondistended  EXT: no swelling, edema in b/l UE and LE  SKIN: no visible ecchymosis or breakdown  PSYCH: normal mood and affect    NEURO:   Motor   LLE 5/5 hip flexor, 5/5 knee extensor, 5/5 ankle dorsiflexor, 5/5 long toe extensor, 5/5 ankle plantar flexor  RLE 5/5 hip flexor, 5/5 knee extensor, 5/5 ankle dorsiflexor, 0/5 long toe extensor, 5/5 ankle plantar flexor    Reflexes  LLE: 2+ patella, 2+ Achilles  RLE: 2+ patella, 2+ Achilles  Babinski: - (negative) LLE, - (negative) RLE    Special Tests:  Straight Leg Raise: - (negative) LLE, - (negative) RLE    MSK:  Gait and Station: wide based with cane in right hand    Lumbar Spine:  Inspection: Normal alignment. No erythema, discoloration, or asymmetry. Surgical scar noted  Palpation: right psp ttp, L5-S1 jxn ttp  ROM: pain all directions worse in ext  Facet Loading Pain: bilat +    Sacroiliac Joint:   Inspection: Normal alignment. No erythema, discoloration, or asymmetry.  Palpation: right psis ttp  FABER: -  positive RLE  Active assisted straight leg raise: , back pain RLE    Hip:  Inspection: Normal alignment. No erythema, discoloration, or asymmetry.  Palpation: No tenderness to palpation.  ROM: normal flexion, extension, internal/external rotation  FADIR: - (negative) LLE, - (negative) RLE      Tia Masker, MD  Assistant Professor - PM&R  Musculoskeletal and Spine Specialist - [facility]???[facility] [employer]

## 2017-11-17 NOTE — Unmapped (Signed)
Please have XR done on your way out today.    I will email you in my chart with the results. If no significant change then I will have my staff at the rehab clinic call you to schedule your trigger point injections with me.

## 2017-11-20 NOTE — Unmapped (Signed)
Thank you for visiting the [facility] Pulmonary Hypertension Clinic.    If you have any questions or concerns, please call     H. Sherre Lain, MD    Marva Panda, RN  Claretha Cooper, RN    Pulmonary Hypertension nurse coordinators:    [phone].

## 2017-11-24 NOTE — Unmapped (Signed)
Pt returning Karl's call.

## 2017-11-27 ENCOUNTER — Inpatient Hospital Stay
Admission: EM | Admit: 2017-11-27 | Discharge: 2017-12-01 | DRG: 481 | Disposition: A | Discharge status: Skilled Nursing Facility | Payer: Medicare Other | Attending: Internal Medicine | Admitting: Internal Medicine

## 2017-11-27 ENCOUNTER — Other Ambulatory Visit: Payer: Self-pay

## 2017-11-27 ENCOUNTER — Emergency Department: Payer: Medicare Other

## 2017-11-27 ENCOUNTER — Encounter: Payer: Self-pay | Admitting: Emergency Medicine

## 2017-11-27 DIAGNOSIS — D62 Acute posthemorrhagic anemia: Secondary | ICD-10-CM | POA: Diagnosis not present

## 2017-11-27 DIAGNOSIS — M25551 Pain in right hip: Secondary | ICD-10-CM | POA: Diagnosis present

## 2017-11-27 DIAGNOSIS — S72141A Displaced intertrochanteric fracture of right femur, initial encounter for closed fracture: Secondary | ICD-10-CM | POA: Diagnosis present

## 2017-11-27 DIAGNOSIS — Z419 Encounter for procedure for purposes other than remedying health state, unspecified: Secondary | ICD-10-CM

## 2017-11-27 DIAGNOSIS — Z794 Long term (current) use of insulin: Secondary | ICD-10-CM

## 2017-11-27 DIAGNOSIS — I272 Pulmonary hypertension, unspecified: Secondary | ICD-10-CM | POA: Diagnosis present

## 2017-11-27 DIAGNOSIS — Z79899 Other long term (current) drug therapy: Secondary | ICD-10-CM | POA: Diagnosis not present

## 2017-11-27 DIAGNOSIS — S72142A Displaced intertrochanteric fracture of left femur, initial encounter for closed fracture: Secondary | ICD-10-CM

## 2017-11-27 DIAGNOSIS — K219 Gastro-esophageal reflux disease without esophagitis: Secondary | ICD-10-CM | POA: Diagnosis present

## 2017-11-27 DIAGNOSIS — Z944 Liver transplant status: Secondary | ICD-10-CM

## 2017-11-27 DIAGNOSIS — Y92007 Garden or yard of unspecified non-institutional (private) residence as the place of occurrence of the external cause: Secondary | ICD-10-CM | POA: Diagnosis not present

## 2017-11-27 DIAGNOSIS — S0003XA Contusion of scalp, initial encounter: Secondary | ICD-10-CM | POA: Diagnosis present

## 2017-11-27 DIAGNOSIS — I480 Paroxysmal atrial fibrillation: Secondary | ICD-10-CM | POA: Diagnosis present

## 2017-11-27 DIAGNOSIS — N400 Enlarged prostate without lower urinary tract symptoms: Secondary | ICD-10-CM | POA: Diagnosis present

## 2017-11-27 DIAGNOSIS — W010XXA Fall on same level from slipping, tripping and stumbling without subsequent striking against object, initial encounter: Secondary | ICD-10-CM | POA: Diagnosis present

## 2017-11-27 DIAGNOSIS — S72009A Fracture of unspecified part of neck of unspecified femur, initial encounter for closed fracture: Secondary | ICD-10-CM | POA: Diagnosis present

## 2017-11-27 DIAGNOSIS — E119 Type 2 diabetes mellitus without complications: Secondary | ICD-10-CM | POA: Diagnosis present

## 2017-11-27 DIAGNOSIS — R944 Abnormal results of kidney function studies: Secondary | ICD-10-CM | POA: Diagnosis not present

## 2017-11-27 LAB — CBC
HEMATOCRIT: 35.3 % — AB (ref 40.0–52.0)
HEMOGLOBIN: 11.9 g/dL — AB (ref 13.0–18.0)
MCH: 30.6 pg (ref 26.0–34.0)
MCHC: 33.7 g/dL (ref 32.0–36.0)
MCV: 91 fL (ref 80.0–100.0)
Platelets: 81 10*3/uL — ABNORMAL LOW (ref 150–440)
RBC: 3.88 MIL/uL — ABNORMAL LOW (ref 4.40–5.90)
RDW: 14 % (ref 11.5–14.5)
WBC: 5 10*3/uL (ref 3.8–10.6)

## 2017-11-27 LAB — BASIC METABOLIC PANEL
ANION GAP: 10 (ref 5–15)
BUN: 44 mg/dL — ABNORMAL HIGH (ref 8–23)
CALCIUM: 9.5 mg/dL (ref 8.9–10.3)
CHLORIDE: 109 mmol/L (ref 98–111)
CO2: 18 mmol/L — AB (ref 22–32)
Creatinine, Ser: 1.81 mg/dL — ABNORMAL HIGH (ref 0.61–1.24)
GFR calc non Af Amer: 36 mL/min — ABNORMAL LOW (ref >60)
GFR, EST AFRICAN AMERICAN: 42 mL/min — AB (ref >60)
GLUCOSE: 165 mg/dL — AB (ref 70–99)
Potassium: 4.7 mmol/L (ref 3.5–5.1)
Sodium: 137 mmol/L (ref 135–145)

## 2017-11-27 LAB — HEPATIC FUNCTION PANEL
ALK PHOS: 90 U/L (ref 38–126)
ALT: 11 U/L (ref 0–44)
AST: 15 U/L (ref 15–41)
Albumin: 4.1 g/dL (ref 3.5–5.0)
BILIRUBIN INDIRECT: 0.6 mg/dL (ref 0.3–0.9)
Bilirubin, Direct: 0.1 mg/dL (ref 0.0–0.2)
TOTAL PROTEIN: 6.4 g/dL — AB (ref 6.5–8.1)
Total Bilirubin: 0.7 mg/dL (ref 0.3–1.2)

## 2017-11-27 LAB — PROTIME-INR
INR: 1.01
Prothrombin Time: 13.2 seconds (ref 11.4–15.2)

## 2017-11-27 MED ORDER — CLINDAMYCIN PHOSPHATE 600 MG/50ML IV SOLN: 600.0000 mg | Freq: Three times a day (TID) | INTRAVENOUS | Status: DC

## 2017-11-27 MED ORDER — SODIUM CHLORIDE 0.9 % IV SOLN
INTRAVENOUS | Status: DC
  Administered 2017-11-27 – 2017-11-28 (×3): via INTRAVENOUS

## 2017-11-27 MED ORDER — CHLORHEXIDINE GLUCONATE 4 % EX LIQD
1.0000 "application " | Freq: Once | CUTANEOUS | Status: AC
  Administered 2017-11-28: 1 via TOPICAL

## 2017-11-27 MED ORDER — CLINDAMYCIN PHOSPHATE 600 MG/50ML IV SOLN
600.0000 mg | INTRAVENOUS | Status: AC
  Administered 2017-11-28: 600 mg via INTRAVENOUS
  Filled 2017-11-27: qty 50

## 2017-11-27 MED ORDER — MORPHINE SULFATE (PF) 4 MG/ML IV SOLN
INTRAVENOUS | Status: AC
  Administered 2017-11-27: 4 mg via INTRAVENOUS
  Filled 2017-11-27: qty 1

## 2017-11-27 MED ORDER — ONDANSETRON HCL 4 MG/2ML IJ SOLN
INTRAMUSCULAR | Status: AC
  Administered 2017-11-27: 4 mg via INTRAVENOUS
  Filled 2017-11-27: qty 2

## 2017-11-27 MED ORDER — MORPHINE SULFATE (PF) 2 MG/ML IV SOLN
1.0000 mg | INTRAVENOUS | Status: DC | PRN
  Administered 2017-11-27 – 2017-11-28 (×2): 1 mg via INTRAVENOUS
  Filled 2017-11-27 (×2): qty 1

## 2017-11-27 MED ORDER — CEFAZOLIN SODIUM-DEXTROSE 2-4 GM/100ML-% IV SOLN
2.0000 g | INTRAVENOUS | Status: AC
  Administered 2017-11-28: 2 g via INTRAVENOUS
  Filled 2017-11-27: qty 100

## 2017-11-27 MED ORDER — MUPIROCIN 2 % EX OINT
1.0000 "application " | TOPICAL_OINTMENT | Freq: Two times a day (BID) | CUTANEOUS | Status: DC
  Filled 2017-11-27: qty 22

## 2017-11-27 MED ORDER — MORPHINE SULFATE (PF) 4 MG/ML IV SOLN
4.0000 mg | Freq: Once | INTRAVENOUS | Status: AC
  Administered 2017-11-27: 4 mg via INTRAVENOUS
  Filled 2017-11-27: qty 1

## 2017-11-27 MED ORDER — HYDROMORPHONE HCL 1 MG/ML IJ SOLN
1.0000 mg | Freq: Once | INTRAMUSCULAR | Status: AC
  Administered 2017-11-27: 1 mg via INTRAVENOUS

## 2017-11-27 MED ORDER — HYDROMORPHONE HCL 1 MG/ML IJ SOLN
INTRAMUSCULAR | Status: AC
  Administered 2017-11-27: 1 mg via INTRAVENOUS
  Filled 2017-11-27: qty 1

## 2017-11-27 MED ORDER — ONDANSETRON HCL 4 MG/2ML IJ SOLN
4.0000 mg | Freq: Once | INTRAMUSCULAR | Status: AC
  Administered 2017-11-27: 4 mg via INTRAVENOUS
  Filled 2017-11-27: qty 2

## 2017-11-27 NOTE — H&P (Signed)
[facility] [place] NAME: Hector Mueller    MR#:  [id number]  DATE OF BIRTH:  April 17, 1950  DATE OF ADMISSION:  11/27/2017  PRIMARY CARE PHYSICIAN: [name], MD   REQUESTING/REFERRING PHYSICIAN:   CHIEF COMPLAINT:   Chief Complaint  Patient presents with  . Fall  . Hip Pain    HISTORY OF PRESENT ILLNESS: Hector Mueller  is a 67 y.o. 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 Patient was brought to emergency room status post mechanical fall, at home.  He was watering his garden, when he slipped and fell onto the hard ground, hitting the head to the fence.  Patient denies losing consciousness however, he complains of head pain and right hip pain.  He has been unable to bear weight on the right lower extremity since the fall.  Patient denies having any chest pain or palpitations.  He is usually able to ambulate more than 200 feet without any chest pain or shortness of breath. Blood test done emergency room are notable for elevated creatinine level of 1.8 and elevated BUN level at 44. Hemoglobin level is 11.9 and glucose level is 165.  INR is 1.  EKG shows normal sinus rhythm, with heart rate of 68 bpm, normal axis, normal intervals, no acute ischemic changes. Head CT shows large RIGHT-sided scalp hematoma, no fractures. Pelvis x-ray shows acute displaced right intertrochanteric fracture. Patient is admitted for surgical repair of his right hip fracture.  PAST MEDICAL HISTORY:   Past Medical History:  Diagnosis Date  . Atrial fibrillation ([place])   . BPH (benign prostatic hyperplasia)   . Cancer ([place])    liver  . Cirrhosis ([place])   . Diabetes mellitus without complication ([place])   . GERD (gastroesophageal reflux disease)   . Hepatitis C     PAST SURGICAL HISTORY:   Past Surgical History:  Procedure Laterality Date  . ANKLE FRACTURE SURGERY Bilateral   . BACK SURGERY    . CLAVICLE SURGERY    . LIVER SURGERY      SOCIAL HISTORY:  Social History   Tobacco Use  . Smoking status: Never Smoker  . Smokeless tobacco: Never Used  Substance Use Topics  . Alcohol use: No    FAMILY HISTORY: History reviewed. No pertinent family history.  DRUG ALLERGIES: No Known Allergies  REVIEW OF SYSTEMS:   CONSTITUTIONAL: No fever, fatigue or weakness.  EYES: No changes in vision.  EARS, NOSE, AND THROAT: No tinnitus or ear pain.  RESPIRATORY: No cough, shortness of breath, wheezing or hemoptysis.  CARDIOVASCULAR: No chest pain, orthopnea, edema.  GASTROINTESTINAL: No nausea, vomiting, diarrhea or abdominal pain.  Patient is status post liver transplant. GENITOURINARY: No dysuria, hematuria.  ENDOCRINE: No polyuria, nocturia. HEMATOLOGY: No bleeding. SKIN: No rash or lesion. MUSCULOSKELETAL: Positive for right hip pain.   NEUROLOGIC: No focal weakness.  Positive for pain at the right temple. PSYCHIATRY: No anxiety or depression.   MEDICATIONS AT HOME:  Prior to Admission medications   Medication Sig Start Date End Date Taking? Authorizing Provider  alendronate (FOSAMAX) 70 MG tablet  10/27/17  Yes [provider]  Cholecalciferol (VITAMIN D3) 2000 units capsule Take 2,000 Units by mouth daily.   Yes [provider]  insulin aspart (NOVOLOG) 100 UNIT/ML FlexPen Inject 6 Units into the skin 3 (three)  times daily with meals.  12/09/16 02/06/18 Yes [provider]  Insulin Degludec 200 UNIT/ML SOPN Inject 28 Units into the skin daily after lunch.  06/22/16  Yes [provider]  Insulin Glargine (LANTUS SOLOSTAR) 100 UNIT/ML Solostar Pen INJECT 12 UNITS SUBCUTANEOUSLY (UNDER SKIN) NIGHTLY 12/09/16 12/09/17 Yes [provider]  Melatonin 3 MG TABS Take by mouth. 02/06/17  Yes [provider]  mirtazapine  (REMERON) 15 MG tablet Take 15 mg by mouth at bedtime.  10/18/17  Yes [provider]  mycophenolate (CELLCEPT) 250 MG capsule TAKE 2 CAPSULES (500MG ) BY MOUTH TWICE DAILY 03/16/17 03/16/18 Yes [provider]  sildenafil (REVATIO) 20 MG tablet Take 40 mg by mouth 3 (three) times daily.  10/17/17  Yes [provider]  Specialty Vitamins Products (MAGNESIUM, AMINO ACID CHELATE,) 133 MG tablet Take by mouth. 03/02/17  Yes [provider]  spironolactone (ALDACTONE) 50 MG tablet Take 50 mg by mouth daily. 01/07/14  Yes [provider]  baclofen (LIORESAL) 10 MG tablet Take 5-10 mg by mouth 2 (two) times daily as needed. 05/31/16   [provider]  lactulose (CHRONULAC) 10 GM/15ML solution Take 30 mLs by mouth 2 (two) times daily. 04/06/16   [provider]  levofloxacin (LEVAQUIN) 500 MG tablet  06/17/16   [provider]  Multiple Vitamin (MULTIVITAMIN) tablet Take 1 tablet by mouth daily.    [provider]  rifaximin (XIFAXAN) 550 MG TABS tablet Take 550 mg by mouth. 07/07/16   [provider]      PHYSICAL EXAMINATION:   VITAL SIGNS: Blood pressure (!) 127/95, pulse 83, temperature 98.2 F (36.8 C), temperature source Oral, resp. rate 20, weight 71.3 kg, SpO2 100 %.  GENERAL:  67 y.o.-year-old patient lying in the bed with moderate distress, secondary to right hip pain.  EYES: Pupils equal, round, reactive to light and accommodation. No scleral icterus. Extraocular muscles intact.  HEENT: Head, noted with small hematoma to the right temple. Oropharynx and nasopharynx clear.  NECK:  Supple, no jugular venous distention. No thyroid enlargement, no tenderness.  LUNGS: Normal breath sounds bilaterally, no wheezing, rales,rhonchi or crepitation. No use of accessory muscles of respiration.  CARDIOVASCULAR: S1, S2 normal. No S3/S4.  ABDOMEN: Soft, nontender, nondistended. Bowel sounds present. No organomegaly or mass.   EXTREMITIES: Right lower extremity is noted to be shortened and externally rotated.  There is severe tenderness with palpation of the right hip.  Patient is unable to perform any range of motion at right hip joint, due to pain.  Neurovascular intact. NEUROLOGIC: No focal weakness. PSYCHIATRIC: The patient is alert and oriented x 3.  SKIN: No obvious rash, lesion, or ulcer.   LABORATORY PANEL:   CBC Recent Labs  Lab 11/27/17 2053  WBC 5.0  HGB 11.9*  HCT 35.3*  PLT 81*  MCV 91.0  MCH 30.6  MCHC 33.7  RDW 14.0   ------------------------------------------------------------------------------------------------------------------  Chemistries  Recent Labs  Lab 11/27/17 2053  NA 137  K 4.7  CL 109  CO2 18*  GLUCOSE 165*  BUN 44*  CREATININE 1.81*  CALCIUM 9.5  AST 15  ALT 11  ALKPHOS 90  BILITOT 0.7   ------------------------------------------------------------------------------------------------------------------ CrCl cannot be calculated (Unknown ideal weight.). ------------------------------------------------------------------------------------------------------------------ No results for input(s): TSH, T4TOTAL, T3FREE, THYROIDAB in the last 72 hours.  Invalid input(s): FREET3   Coagulation profile Recent Labs  Lab 11/27/17 2053  INR 1.01   ------------------------------------------------------------------------------------------------------------------- No results for input(s): DDIMER in the last 72 hours. -------------------------------------------------------------------------------------------------------------------  Cardiac Enzymes No results for input(s): CKMB, TROPONINI, MYOGLOBIN in the last 168 hours.  Invalid input(s): CK ------------------------------------------------------------------------------------------------------------------ Invalid input(s):  POCBNP  ---------------------------------------------------------------------------------------------------------------  Urinalysis    Component Value Date/Time   COLORURINE YELLOW 01/26/2015 1710   APPEARANCEUR CLOUDY (A) 01/26/2015 1710   LABSPEC 1.020 01/26/2015 1710   PHURINE 7.0 01/26/2015 1710   GLUCOSEU NEGATIVE 01/26/2015 1710   HGBUR TRACE (A) 01/26/2015 1710   BILIRUBINUR NEGATIVE 01/26/2015 1710   KETONESUR NEGATIVE 01/26/2015 1710   PROTEINUR NEGATIVE 01/26/2015 1710   NITRITE NEGATIVE 01/26/2015 1710   LEUKOCYTESUR NEGATIVE 01/26/2015 1710     RADIOLOGY: Dg Chest 1 View  Result Date: 11/27/2017 CLINICAL DATA:  Fall with hip fracture EXAM: CHEST  1 VIEW COMPARISON:  01/26/2015 FINDINGS: Partially visualized lower thoracic rods. No acute airspace disease or effusion. Normal cardiomediastinal silhouette. Aortic atherosclerosis. No pneumothorax. IMPRESSION: No active disease. Electronically Signed   By: Donavan Foil M.D.   On: 11/27/2017 20:47   Ct Head Wo Contrast  Result Date: 11/27/2017 CLINICAL DATA:  Tripped and fell, hitting head. EXAM: CT HEAD WITHOUT CONTRAST CT CERVICAL SPINE WITHOUT CONTRAST TECHNIQUE: Multidetector CT imaging of the head and cervical spine was performed following the standard protocol without intravenous contrast. Multiplanar CT image reconstructions of the cervical spine were also generated. COMPARISON:  None. FINDINGS: CT HEAD FINDINGS Brain: No evidence for acute infarction, hemorrhage, mass lesion, hydrocephalus, or extra-axial fluid. Mild atrophy. Hypoattenuation of white matter, likely small vessel disease. Chronic appearing RIGHT thalamic lacunar infarct. Vascular: Calcification of the cavernous internal carotid arteries consistent with cerebrovascular atherosclerotic disease. No signs of intracranial large vessel occlusion. Skull: Normal. Negative for fracture or focal lesion. Sinuses/Orbits: No acute finding. Other: Large RIGHT-sided scalp  hematoma over the temporalis muscle region. CT CERVICAL SPINE FINDINGS Alignment: 1-2 mm of facet mediated retrolisthesis at C4-5 and 1-2 mm of facet mediated anterolisthesis at C7-T1, otherwise anatomic. No traumatic subluxation. Skull base and vertebrae: No acute fracture. No primary bone lesion or focal pathologic process. Soft tissues and spinal canal: No prevertebral fluid or swelling. No visible canal hematoma. Disc levels: Multilevel spondylosis, with advanced disc space narrowing, most pronounced at C6-C7. Upper chest: Unremarkable. Other: None. IMPRESSION: No skull fracture or intracranial hemorrhage. Large RIGHT-sided scalp hematoma. Multilevel spondylosis. No cervical spine fracture or traumatic subluxation. Electronically Signed   By: Staci Righter M.D.   On: 11/27/2017 21:28   Ct Cervical Spine Wo Contrast  Result Date: 11/27/2017 CLINICAL DATA:  Tripped and fell, hitting head. EXAM: CT HEAD WITHOUT CONTRAST CT CERVICAL SPINE WITHOUT CONTRAST TECHNIQUE: Multidetector CT imaging of the head and cervical spine was performed following the standard protocol without intravenous contrast. Multiplanar CT image reconstructions of the cervical spine were also generated. COMPARISON:  None. FINDINGS: CT HEAD FINDINGS Brain: No evidence for acute infarction, hemorrhage, mass lesion, hydrocephalus, or extra-axial fluid. Mild atrophy. Hypoattenuation of white matter, likely small vessel disease. Chronic appearing RIGHT thalamic lacunar infarct. Vascular: Calcification of the cavernous internal carotid arteries consistent with cerebrovascular atherosclerotic disease. No signs of intracranial large vessel occlusion. Skull: Normal. Negative for fracture or focal lesion. Sinuses/Orbits: No acute finding. Other: Large RIGHT-sided scalp hematoma over the temporalis muscle region. CT CERVICAL SPINE FINDINGS Alignment: 1-2 mm of facet mediated retrolisthesis at C4-5 and 1-2 mm of facet mediated anterolisthesis at C7-T1,  otherwise anatomic. No traumatic subluxation. Skull base and vertebrae: No acute fracture. No primary bone lesion or focal pathologic process. Soft tissues and spinal canal: No prevertebral fluid or  swelling. No visible canal hematoma. Disc levels: Multilevel spondylosis, with advanced disc space narrowing, most pronounced at C6-C7. Upper chest: Unremarkable. Other: None. IMPRESSION: No skull fracture or intracranial hemorrhage. Large RIGHT-sided scalp hematoma. Multilevel spondylosis. No cervical spine fracture or traumatic subluxation. Electronically Signed   By: Staci Righter M.D.   On: 11/27/2017 21:28   Dg Hip Unilat W Or Wo Pelvis 2-3 Views Right  Result Date: 11/27/2017 CLINICAL DATA:  Right hip pain after fall EXAM: DG HIP (WITH OR WITHOUT PELVIS) 2-3V RIGHT COMPARISON:  None. FINDINGS: Limited by positioning. Surgical hardware in the lumbosacral spine. Pubic symphysis appears intact. Both femoral heads project in joint. Acute slightly displaced right intertrochanteric fracture. IMPRESSION: Acute displaced right intertrochanteric fracture Electronically Signed   By: Donavan Foil M.D.   On: 11/27/2017 20:46    EKG: Orders placed or performed during the hospital encounter of 11/27/17  . ED EKG  . ED EKG    IMPRESSION AND PLAN:  1.  Acute displaced right intertrochanteric fracture, status post mechanical fall.  Patient is scheduled for surgical repair in a.m., per orthopedics.  We will keep patient n.p.o.  And avoid anticoagulants. Continue pain control. Based on the review of patient's physical capacity, EKG report and medical problems, he is medically clear for the above-mentioned orthopedic procedure.  This patient is at low to moderate risk for postop cardiopulmonary complications.  2.  Liver failure, status post recent liver transplant, last year.  Patient is doing well, liver function tests are within normal limits.  Will continue medications per transplant team and monitor liver  function closely.  3.  Elevated BUN and creatinine level, likely secondary to CellCept use.  We will start IV fluids to help with a possible prerenal component.  We will continue to monitor kidney function closely and avoid nephrotoxic medications.  4.  Paroxysmal atrial fibrillation, currently in sinus rhythm, rate controlled.  Continue to monitor on telemetry.  Will hold Coumadin for now due to upcoming surgery.  5.  Diabetes type 2.  Will monitor blood sugars before meals and at bedtime and use insulin treatment during the hospital stay.  All the records are reviewed and case discussed with ED provider. Management plans discussed with the patient, who is in agreement.  CODE STATUS: Full Advance Directive Documentation     Most Recent Value  Type of Advance Directive  Healthcare Power of Attorney, Living will  Pre-existing out of facility DNR order (yellow form or pink MOST form)  -  "MOST" Form in Place?  -       TOTAL TIME TAKING CARE OF THIS PATIENT: 50 minutes.    Amelia Jo M.D on 11/27/2017 at 11:48 PM  Between 7am to 6pm - Pager - [phone]  After 6pm go to [email or web address] - password [email or web address] [facility] [place] [facility]  [phone]  CC: Primary care physician; [name], MD

## 2017-11-27 NOTE — ED Triage Notes (Addendum)
Pt arrived via EMS from home where pt was watering flowers and tripped, landing onto the right hip and hitting head on metal fence. Pt takes Warfarin for A-Fib. Pt c/o right hip and head pain. Pt denies LOC.

## 2017-11-27 NOTE — Progress Notes (Signed)
Patient is NOT on Coumadin. Was taken off by provider a month ago.

## 2017-11-27 NOTE — ED Provider Notes (Signed)
[facility] Emergency Department Provider Note  Time seen: 8:18 PM  I have reviewed the triage vital signs and the nursing notes.   HISTORY  Chief Complaint Fall and Hip Pain    HPI Hector Mueller is a 67 y.o. male with a past medical history of atrial fibrillation on Coumadin, cirrhosis, diabetes, hepatitis, presents to the emergency department after a fall.  According to the patient he was out watering his garden when he slipped and fell onto a hard ground.  Patient did hit his head is not sure if he passed out.  Has a small hematoma to the right forehead.  Patient's main complaint is of significant right hip pain.  Is not been able to ambulate since the fall.  The patient states the pain is much worse with any attempted palpation or movement.  Aching type pain, sharp with movement.   Past Medical History:  Diagnosis Date  . Atrial fibrillation ([place])   . BPH (benign prostatic hyperplasia)   . Cancer ([place])    liver  . Cirrhosis ([place])   . Diabetes mellitus without complication ([place])   . GERD (gastroesophageal reflux disease)   . Hepatitis C     There are no active problems to display for this patient.   Past Surgical History:  Procedure Laterality Date  . ANKLE FRACTURE SURGERY Bilateral   . BACK SURGERY    . CLAVICLE SURGERY    . LIVER SURGERY      Prior to Admission medications   Medication Sig Start Date End Date Taking? Authorizing Provider  baclofen (LIORESAL) 10 MG tablet Take 5-10 mg by mouth 2 (two) times daily as needed. 05/31/16   [provider]  Cholecalciferol (VITAMIN D3) 2000 units capsule Take 2,000 Units by mouth daily.    [provider]  furosemide (LASIX) 20 MG tablet Take 20 mg by mouth 2 (two) times daily. 04/06/16 04/06/17  [provider]  Insulin Degludec 200 UNIT/ML SOPN Inject 28 Units into the skin daily after lunch.  06/22/16   [provider]  lactulose (CHRONULAC) 10 GM/15ML solution  Take 30 mLs by mouth 2 (two) times daily. 04/06/16   [provider]  levofloxacin (LEVAQUIN) 500 MG tablet  06/17/16   [provider]  Multiple Vitamin (MULTIVITAMIN) tablet Take 1 tablet by mouth daily.    [provider]  rifaximin (XIFAXAN) 550 MG TABS tablet Take 550 mg by mouth. 07/07/16   [provider]  spironolactone (ALDACTONE) 50 MG tablet Take 50 mg by mouth daily. 01/07/14   [provider]    No Known Allergies  History reviewed. No pertinent family history.  Social History Social History   Tobacco Use  . Smoking status: Never Smoker  . Smokeless tobacco: Never Used  Substance Use Topics  . Alcohol use: No  . Drug use: Not on file    Review of Systems Constitutional: Negative for fever.  Does not believe he passed out. Cardiovascular: Negative for chest pain. Respiratory: Negative for shortness of breath. Gastrointestinal: Negative for abdominal pain, vomiting  Genitourinary: Negative for urinary compaints Musculoskeletal: Right hip pain. Skin: Hematoma/abrasion right forehead. Neurological: Negative for headache All other ROS negative  ____________________________________________   PHYSICAL EXAM:  VITAL SIGNS: ED Triage Vitals  Enc Vitals Group     BP --      Pulse Rate 11/27/17 2017 70     Resp 11/27/17 2017 (!) 22     Temp 11/27/17 2017 98.1 F (36.7  C)     Temp Source 11/27/17 2017 Oral     SpO2 11/27/17 2017 100 %     Weight 11/27/17 2016 157 lb 3 oz (71.3 kg)     Height --      Head Circumference --      Peak Flow --      Pain Score --      Pain Loc --      Pain Edu? --      Excl. in [place]? --    Constitutional: Alert and oriented. Well appearing and in no distress. Eyes: Normal exam ENT   Head: Small hematoma/abrasion to right temple.   Mouth/Throat: Mucous membranes are moist. Cardiovascular: Normal rate, regular rhythm.  Respiratory: Normal respiratory effort without tachypnea nor  retractions. Breath sounds are clear  Gastrointestinal: Soft and nontender. No distention.   Musculoskeletal: Moderate tenderness to palpation of the right hip, pain with attempted range of motion.  Neurovascular intact. Neurologic:  Normal speech and language. No gross focal neurologic deficits Skin:  Skin is warm, dry.  Small abrasion/hematoma to right temple. Psychiatric: Mood and affect are normal.  ____________________________________________    EKG  EKG reviewed and interpreted by myself shows what appears to be a sinus rhythm at 68 bpm with a narrow QRS, normal axis, normal intervals with nonspecific ST changes  ____________________________________________    RADIOLOGY  CT head is negative for intracranial abnormality. CT cervical spine negative. Chest x-ray negative. Right hip x-ray shows a displaced intertrochanteric fracture.   ____________________________________________   INITIAL IMPRESSION / ASSESSMENT AND PLAN / ED COURSE  Pertinent labs & imaging results that were available during my care of the patient were reviewed by me and considered in my medical decision making (see chart for details).  Patient presents emergency department after a fall on Coumadin and hit his head also has significant right hip pain.  Differential would include right hip fracture, contusion, dislocation.  Hematoma, ICH.  We will check labs including INR, obtain CT imaging of the head and neck as well as x-rays of the hip.  Patient agreeable plan of care.  I have reviewed the patient's records extensively he has significant past medical history including a liver transplant previously currently on immunosuppressants.  Patient has paroxysmal atrial fibrillation is supposed to be taking Coumadin according to his last note by his PCP 1 month ago however the patient's INR today is 1.0.  Labs otherwise at baseline.  CT scan of the head is negative for acute abnormality.  We will discuss with the  hospitalist for admission.  I discussed with Dr. Sabra Heck of orthopedics, as his INR is normal they plan to likely repair tomorrow.  ____________________________________________   FINAL CLINICAL IMPRESSION(S) / ED DIAGNOSES  Fall Intertrochanteric hip fracture   Harvest Dark, MD 11/27/17 2139

## 2017-11-28 ENCOUNTER — Inpatient Hospital Stay: Payer: Medicare Other

## 2017-11-28 ENCOUNTER — Encounter
Admission: EM | Disposition: A | Discharge status: Skilled Nursing Facility | Payer: Self-pay | Source: Home / Self Care | Attending: Internal Medicine

## 2017-11-28 ENCOUNTER — Inpatient Hospital Stay: Payer: Medicare Other | Admitting: Anesthesiology

## 2017-11-28 ENCOUNTER — Encounter: Payer: Self-pay | Admitting: Anesthesiology

## 2017-11-28 HISTORY — PX: INTRAMEDULLARY (IM) NAIL INTERTROCHANTERIC: SHX5875

## 2017-11-28 LAB — CBC
HCT: 29.3 % — ABNORMAL LOW (ref 40.0–52.0)
Hemoglobin: 10.1 g/dL — ABNORMAL LOW (ref 13.0–18.0)
MCH: 31.4 pg (ref 26.0–34.0)
MCHC: 34.6 g/dL (ref 32.0–36.0)
MCV: 90.7 fL (ref 80.0–100.0)
PLATELETS: 74 10*3/uL — AB (ref 150–440)
RBC: 3.23 MIL/uL — ABNORMAL LOW (ref 4.40–5.90)
RDW: 13.8 % (ref 11.5–14.5)
WBC: 4.7 10*3/uL (ref 3.8–10.6)

## 2017-11-28 LAB — GLUCOSE, CAPILLARY
GLUCOSE-CAPILLARY: 214 mg/dL — AB (ref 70–99)
GLUCOSE-CAPILLARY: 199 mg/dL — AB (ref 70–99)
GLUCOSE-CAPILLARY: 214 mg/dL — AB (ref 70–99)
GLUCOSE-CAPILLARY: 154 mg/dL — AB (ref 70–99)
Glucose-Capillary: 145 mg/dL — ABNORMAL HIGH (ref 70–99)

## 2017-11-28 LAB — SURGICAL PCR SCREEN
MRSA, PCR: NEGATIVE
STAPHYLOCOCCUS AUREUS: NEGATIVE

## 2017-11-28 LAB — BASIC METABOLIC PANEL
Anion gap: 6 (ref 5–15)
BUN: 43 mg/dL — ABNORMAL HIGH (ref 8–23)
CALCIUM: 9 mg/dL (ref 8.9–10.3)
CO2: 21 mmol/L — AB (ref 22–32)
CREATININE: 1.65 mg/dL — AB (ref 0.61–1.24)
Chloride: 110 mmol/L (ref 98–111)
GFR calc Af Amer: 47 mL/min — ABNORMAL LOW (ref >60)
GFR calc non Af Amer: 41 mL/min — ABNORMAL LOW (ref >60)
GLUCOSE: 232 mg/dL — AB (ref 70–99)
Potassium: 5 mmol/L (ref 3.5–5.1)
Sodium: 137 mmol/L (ref 135–145)

## 2017-11-28 SURGERY — FIXATION, FRACTURE, INTERTROCHANTERIC, WITH INTRAMEDULLARY ROD
Anesthesia: General | Laterality: Right

## 2017-11-28 MED ORDER — DEXAMETHASONE SODIUM PHOSPHATE 10 MG/ML IJ SOLN
INTRAMUSCULAR | Status: DC | PRN
  Administered 2017-11-28: 5 mg via INTRAVENOUS

## 2017-11-28 MED ORDER — LIDOCAINE HCL (CARDIAC) PF 100 MG/5ML IV SOSY
PREFILLED_SYRINGE | INTRAVENOUS | Status: DC | PRN
  Administered 2017-11-28: 50 mg via INTRAVENOUS

## 2017-11-28 MED ORDER — DEXMEDETOMIDINE HCL IN NACL 200 MCG/50ML IV SOLN
INTRAVENOUS | Status: AC
  Filled 2017-11-28: qty 50

## 2017-11-28 MED ORDER — CYCLOSPORINE MODIFIED (NEORAL) 25 MG PO CAPS
75.0000 mg | ORAL_CAPSULE | Freq: Two times a day (BID) | ORAL | Status: DC
  Administered 2017-11-29 – 2017-12-01 (×6): 75 mg via ORAL
  Filled 2017-11-28 (×11): qty 3

## 2017-11-28 MED ORDER — LORAZEPAM 2 MG/ML IJ SOLN: 0.5000 mg | Freq: Two times a day (BID) | INTRAMUSCULAR | Status: DC | PRN

## 2017-11-28 MED ORDER — CLINDAMYCIN PHOSPHATE 600 MG/50ML IV SOLN
600.0000 mg | Freq: Three times a day (TID) | INTRAVENOUS | Status: AC
  Administered 2017-11-28 – 2017-11-29 (×3): 600 mg via INTRAVENOUS
  Filled 2017-11-28 (×3): qty 50

## 2017-11-28 MED ORDER — FENTANYL CITRATE (PF) 100 MCG/2ML IJ SOLN
INTRAMUSCULAR | Status: AC
  Filled 2017-11-28: qty 2

## 2017-11-28 MED ORDER — RIFAXIMIN 550 MG PO TABS: 550.0000 mg | ORAL_TABLET | Freq: Two times a day (BID) | ORAL | Status: DC

## 2017-11-28 MED ORDER — ALUM & MAG HYDROXIDE-SIMETH 200-200-20 MG/5ML PO SUSP
30.0000 mL | ORAL | Status: DC | PRN
  Administered 2017-11-29 – 2017-11-30 (×3): 30 mL via ORAL
  Filled 2017-11-28 (×3): qty 30

## 2017-11-28 MED ORDER — MAGNESIUM HYDROXIDE 400 MG/5ML PO SUSP
30.0000 mL | Freq: Every day | ORAL | Status: DC | PRN
  Administered 2017-11-30: 30 mL via ORAL
  Filled 2017-11-28 (×2): qty 30

## 2017-11-28 MED ORDER — BUPIVACAINE-EPINEPHRINE (PF) 0.5% -1:200000 IJ SOLN
INTRAMUSCULAR | Status: AC
  Filled 2017-11-28: qty 30

## 2017-11-28 MED ORDER — MYCOPHENOLATE MOFETIL 250 MG PO CAPS
250.0000 mg | ORAL_CAPSULE | Freq: Two times a day (BID) | ORAL | Status: DC
  Filled 2017-11-28 (×3): qty 1

## 2017-11-28 MED ORDER — HYDROCODONE-ACETAMINOPHEN 7.5-325 MG PO TABS
1.0000 | ORAL_TABLET | Freq: Four times a day (QID) | ORAL | Status: DC | PRN
  Administered 2017-11-29 – 2017-12-01 (×4): 1 via ORAL
  Filled 2017-11-28 (×4): qty 1

## 2017-11-28 MED ORDER — ONDANSETRON HCL 4 MG PO TABS: 4.0000 mg | ORAL_TABLET | Freq: Four times a day (QID) | ORAL | Status: DC | PRN

## 2017-11-28 MED ORDER — ONDANSETRON HCL 4 MG/2ML IJ SOLN: 4.0000 mg | Freq: Once | INTRAMUSCULAR | Status: DC | PRN

## 2017-11-28 MED ORDER — MELATONIN 3 MG PO TABS: 5.0000 mg | ORAL_TABLET | Freq: Every day | ORAL | Status: DC

## 2017-11-28 MED ORDER — SODIUM CHLORIDE 0.45 % IV SOLN
INTRAVENOUS | Status: DC
  Administered 2017-11-28 – 2017-11-29 (×2): via INTRAVENOUS

## 2017-11-28 MED ORDER — SUCCINYLCHOLINE CHLORIDE 20 MG/ML IJ SOLN
INTRAMUSCULAR | Status: DC | PRN
  Administered 2017-11-28: 100 mg via INTRAVENOUS

## 2017-11-28 MED ORDER — ACETAMINOPHEN 325 MG PO TABS: 325.0000 mg | ORAL_TABLET | Freq: Four times a day (QID) | ORAL | Status: DC | PRN

## 2017-11-28 MED ORDER — FERROUS SULFATE 325 (65 FE) MG PO TABS
325.0000 mg | ORAL_TABLET | Freq: Every day | ORAL | Status: DC
  Administered 2017-11-29 – 2017-12-01 (×3): 325 mg via ORAL
  Filled 2017-11-28 (×3): qty 1

## 2017-11-28 MED ORDER — HYDROMORPHONE HCL 1 MG/ML IJ SOLN
0.5000 mg | INTRAMUSCULAR | Status: DC | PRN
  Administered 2017-11-28 – 2017-11-29 (×3): 0.5 mg via INTRAVENOUS
  Filled 2017-11-28 (×3): qty 1

## 2017-11-28 MED ORDER — ADULT MULTIVITAMIN W/MINERALS CH: 1.0000 | ORAL_TABLET | Freq: Every day | ORAL | Status: DC

## 2017-11-28 MED ORDER — GLYCOPYRROLATE 0.2 MG/ML IJ SOLN
INTRAMUSCULAR | Status: AC
  Filled 2017-11-28: qty 1

## 2017-11-28 MED ORDER — ROCURONIUM BROMIDE 50 MG/5ML IV SOLN
INTRAVENOUS | Status: AC
  Filled 2017-11-28: qty 1

## 2017-11-28 MED ORDER — SPIRONOLACTONE 25 MG PO TABS: 50.0000 mg | ORAL_TABLET | Freq: Every day | ORAL | Status: DC

## 2017-11-28 MED ORDER — LIDOCAINE HCL (PF) 2 % IJ SOLN
INTRAMUSCULAR | Status: AC
  Filled 2017-11-28: qty 10

## 2017-11-28 MED ORDER — NEOMYCIN-POLYMYXIN B GU 40-200000 IR SOLN
Status: DC | PRN
  Administered 2017-11-28: 2 mL

## 2017-11-28 MED ORDER — INSULIN GLARGINE 100 UNIT/ML ~~LOC~~ SOLN
16.0000 [IU] | Freq: Every day | SUBCUTANEOUS | Status: DC
  Filled 2017-11-28: qty 0.16

## 2017-11-28 MED ORDER — ACETAMINOPHEN 650 MG RE SUPP: 650.0000 mg | Freq: Four times a day (QID) | RECTAL | Status: DC | PRN

## 2017-11-28 MED ORDER — TRANEXAMIC ACID 1000 MG/10ML IV SOLN
1000.0000 mg | INTRAVENOUS | Status: AC
  Administered 2017-11-28: 1000 mg via INTRAVENOUS
  Filled 2017-11-28: qty 1000

## 2017-11-28 MED ORDER — ROCURONIUM BROMIDE 100 MG/10ML IV SOLN
INTRAVENOUS | Status: DC | PRN
  Administered 2017-11-28: 5 mg via INTRAVENOUS

## 2017-11-28 MED ORDER — HYDROCODONE-ACETAMINOPHEN 5-325 MG PO TABS
1.0000 | ORAL_TABLET | Freq: Four times a day (QID) | ORAL | Status: DC | PRN
  Administered 2017-11-28 – 2017-12-01 (×5): 1 via ORAL
  Filled 2017-11-28 (×5): qty 1

## 2017-11-28 MED ORDER — PROPOFOL 10 MG/ML IV BOLUS
INTRAVENOUS | Status: DC | PRN
  Administered 2017-11-28: 150 mg via INTRAVENOUS

## 2017-11-28 MED ORDER — VITAMIN D 1000 UNITS PO TABS
2000.0000 [IU] | ORAL_TABLET | Freq: Every day | ORAL | Status: DC
  Administered 2017-11-29 – 2017-12-01 (×3): 2000 [IU] via ORAL
  Filled 2017-11-28 (×3): qty 2

## 2017-11-28 MED ORDER — PROPOFOL 10 MG/ML IV BOLUS
INTRAVENOUS | Status: AC
  Filled 2017-11-28: qty 20

## 2017-11-28 MED ORDER — CYCLOSPORINE MODIFIED (NEORAL) 100 MG PO CAPS
100.0000 mg | ORAL_CAPSULE | Freq: Two times a day (BID) | ORAL | Status: DC
  Administered 2017-11-29 – 2017-12-01 (×6): 100 mg via ORAL
  Filled 2017-11-28 (×6): qty 1

## 2017-11-28 MED ORDER — FENTANYL CITRATE (PF) 100 MCG/2ML IJ SOLN
INTRAMUSCULAR | Status: AC
  Administered 2017-11-28: 25 ug via INTRAVENOUS
  Filled 2017-11-28: qty 2

## 2017-11-28 MED ORDER — FENTANYL CITRATE (PF) 100 MCG/2ML IJ SOLN
INTRAMUSCULAR | Status: DC | PRN
  Administered 2017-11-28 (×2): 50 ug via INTRAVENOUS

## 2017-11-28 MED ORDER — SILDENAFIL CITRATE 20 MG PO TABS
40.0000 mg | ORAL_TABLET | Freq: Three times a day (TID) | ORAL | Status: DC
  Administered 2017-11-28 – 2017-12-01 (×8): 40 mg via ORAL
  Filled 2017-11-28 (×12): qty 2

## 2017-11-28 MED ORDER — INSULIN ASPART 100 UNIT/ML ~~LOC~~ SOLN
0.0000 [IU] | Freq: Three times a day (TID) | SUBCUTANEOUS | Status: DC
  Administered 2017-11-28 (×2): 3 [IU] via SUBCUTANEOUS
  Administered 2017-11-29 (×2): 1 [IU] via SUBCUTANEOUS
  Administered 2017-11-29: 2 [IU] via SUBCUTANEOUS
  Administered 2017-11-30: 1 [IU] via SUBCUTANEOUS
  Administered 2017-11-30 (×2): 2 [IU] via SUBCUTANEOUS
  Administered 2017-12-01: 1 [IU] via SUBCUTANEOUS
  Filled 2017-11-28 (×9): qty 1

## 2017-11-28 MED ORDER — ONDANSETRON HCL 4 MG/2ML IJ SOLN
INTRAMUSCULAR | Status: AC
  Filled 2017-11-28: qty 2

## 2017-11-28 MED ORDER — INSULIN ASPART 100 UNIT/ML ~~LOC~~ SOLN
6.0000 [IU] | Freq: Three times a day (TID) | SUBCUTANEOUS | Status: DC
  Administered 2017-11-28 – 2017-12-01 (×3): 6 [IU] via SUBCUTANEOUS
  Filled 2017-11-28 (×4): qty 1

## 2017-11-28 MED ORDER — DEXAMETHASONE SODIUM PHOSPHATE 10 MG/ML IJ SOLN
INTRAMUSCULAR | Status: AC
  Filled 2017-11-28: qty 1

## 2017-11-28 MED ORDER — TRANEXAMIC ACID 1000 MG/10ML IV SOLN
INTRAVENOUS | Status: AC
  Filled 2017-11-28: qty 10

## 2017-11-28 MED ORDER — BACLOFEN 10 MG PO TABS
5.0000 mg | ORAL_TABLET | Freq: Two times a day (BID) | ORAL | Status: DC | PRN
  Filled 2017-11-28: qty 1

## 2017-11-28 MED ORDER — TRAMADOL HCL 50 MG PO TABS
50.0000 mg | ORAL_TABLET | Freq: Four times a day (QID) | ORAL | Status: DC
  Administered 2017-11-28 – 2017-11-29 (×3): 50 mg via ORAL
  Filled 2017-11-28 (×3): qty 1

## 2017-11-28 MED ORDER — INSULIN ASPART 100 UNIT/ML ~~LOC~~ SOLN: 0.0000 [IU] | Freq: Every day | SUBCUTANEOUS | Status: DC

## 2017-11-28 MED ORDER — ONDANSETRON HCL 4 MG/2ML IJ SOLN
4.0000 mg | Freq: Four times a day (QID) | INTRAMUSCULAR | Status: DC | PRN
  Administered 2017-11-29: 4 mg via INTRAVENOUS

## 2017-11-28 MED ORDER — DOCUSATE SODIUM 100 MG PO CAPS
100.0000 mg | ORAL_CAPSULE | Freq: Two times a day (BID) | ORAL | Status: DC
  Administered 2017-11-29 – 2017-11-30 (×5): 100 mg via ORAL
  Filled 2017-11-28 (×7): qty 1

## 2017-11-28 MED ORDER — ENOXAPARIN SODIUM 30 MG/0.3ML ~~LOC~~ SOLN
30.0000 mg | SUBCUTANEOUS | Status: DC
  Administered 2017-11-29: 30 mg via SUBCUTANEOUS
  Filled 2017-11-28 (×2): qty 0.3

## 2017-11-28 MED ORDER — MORPHINE SULFATE (PF) 2 MG/ML IV SOLN
1.0000 mg | INTRAVENOUS | Status: DC | PRN
  Administered 2017-11-28 – 2017-11-29 (×2): 1 mg via INTRAVENOUS
  Filled 2017-11-28 (×2): qty 1

## 2017-11-28 MED ORDER — BACLOFEN 10 MG PO TABS: 5.0000 mg | ORAL_TABLET | Freq: Two times a day (BID) | ORAL | Status: DC | PRN

## 2017-11-28 MED ORDER — METOCLOPRAMIDE HCL 10 MG PO TABS: 5.0000 mg | ORAL_TABLET | Freq: Three times a day (TID) | ORAL | Status: DC | PRN

## 2017-11-28 MED ORDER — TRAZODONE HCL 50 MG PO TABS
25.0000 mg | ORAL_TABLET | Freq: Every evening | ORAL | Status: DC | PRN
  Administered 2017-11-28: 25 mg via ORAL
  Filled 2017-11-28: qty 1

## 2017-11-28 MED ORDER — HYDROCODONE-ACETAMINOPHEN 5-325 MG PO TABS
1.0000 | ORAL_TABLET | ORAL | Status: DC | PRN
  Administered 2017-11-28: 2 via ORAL
  Filled 2017-11-28: qty 2

## 2017-11-28 MED ORDER — NEOMYCIN-POLYMYXIN B GU 40-200000 IR SOLN
Status: AC
  Filled 2017-11-28: qty 2

## 2017-11-28 MED ORDER — MAGNESIUM OXIDE 400 (241.3 MG) MG PO TABS
400.0000 mg | ORAL_TABLET | Freq: Every day | ORAL | Status: DC
  Administered 2017-11-29 – 2017-12-01 (×3): 400 mg via ORAL
  Filled 2017-11-28 (×3): qty 1

## 2017-11-28 MED ORDER — FLEET ENEMA 7-19 GM/118ML RE ENEM: 1.0000 | ENEMA | Freq: Once | RECTAL | Status: DC | PRN

## 2017-11-28 MED ORDER — FENTANYL CITRATE (PF) 100 MCG/2ML IJ SOLN
25.0000 ug | INTRAMUSCULAR | Status: AC | PRN
  Administered 2017-11-28 (×6): 25 ug via INTRAVENOUS

## 2017-11-28 MED ORDER — ACETAMINOPHEN 325 MG PO TABS: 650.0000 mg | ORAL_TABLET | Freq: Four times a day (QID) | ORAL | Status: DC | PRN

## 2017-11-28 MED ORDER — MIRTAZAPINE 15 MG PO TABS
15.0000 mg | ORAL_TABLET | Freq: Every day | ORAL | Status: DC
  Administered 2017-11-28 – 2017-11-30 (×3): 15 mg via ORAL
  Filled 2017-11-28 (×3): qty 1

## 2017-11-28 MED ORDER — BUPIVACAINE-EPINEPHRINE (PF) 0.5% -1:200000 IJ SOLN
INTRAMUSCULAR | Status: DC | PRN
  Administered 2017-11-28: 30 mL via PERINEURAL

## 2017-11-28 MED ORDER — CEFAZOLIN SODIUM-DEXTROSE 2-4 GM/100ML-% IV SOLN
2.0000 g | Freq: Three times a day (TID) | INTRAVENOUS | Status: AC
  Administered 2017-11-28 – 2017-11-29 (×3): 2 g via INTRAVENOUS
  Filled 2017-11-28 (×4): qty 100

## 2017-11-28 MED ORDER — MENTHOL 3 MG MT LOZG
1.0000 | LOZENGE | OROMUCOSAL | Status: DC | PRN
  Filled 2017-11-28: qty 9

## 2017-11-28 MED ORDER — ZOLPIDEM TARTRATE 5 MG PO TABS: 5.0000 mg | ORAL_TABLET | Freq: Every evening | ORAL | Status: DC | PRN

## 2017-11-28 MED ORDER — ONDANSETRON HCL 4 MG/2ML IJ SOLN
INTRAMUSCULAR | Status: DC | PRN
  Administered 2017-11-28: 4 mg via INTRAVENOUS

## 2017-11-28 MED ORDER — ONDANSETRON HCL 4 MG/2ML IJ SOLN
4.0000 mg | Freq: Four times a day (QID) | INTRAMUSCULAR | Status: DC | PRN
  Administered 2017-11-30 (×2): 4 mg via INTRAVENOUS
  Filled 2017-11-28 (×3): qty 2

## 2017-11-28 MED ORDER — PHENOL 1.4 % MT LIQD
1.0000 | OROMUCOSAL | Status: DC | PRN
  Filled 2017-11-28: qty 177

## 2017-11-28 MED ORDER — SENNA 8.6 MG PO TABS
1.0000 | ORAL_TABLET | Freq: Two times a day (BID) | ORAL | Status: DC
  Administered 2017-11-28 – 2017-11-30 (×5): 8.6 mg via ORAL
  Filled 2017-11-28 (×5): qty 1

## 2017-11-28 MED ORDER — MELATONIN 5 MG PO TABS
5.0000 mg | ORAL_TABLET | Freq: Every day | ORAL | Status: DC
  Administered 2017-11-28 – 2017-11-30 (×3): 5 mg via ORAL
  Filled 2017-11-28 (×5): qty 1

## 2017-11-28 MED ORDER — INSULIN DEGLUDEC 200 UNIT/ML ~~LOC~~ SOPN: 15.0000 [IU] | PEN_INJECTOR | Freq: Every day | SUBCUTANEOUS | Status: DC

## 2017-11-28 MED ORDER — MYCOPHENOLATE MOFETIL 250 MG PO CAPS
500.0000 mg | ORAL_CAPSULE | Freq: Two times a day (BID) | ORAL | Status: DC
  Administered 2017-11-28 – 2017-12-01 (×6): 500 mg via ORAL
  Filled 2017-11-28 (×5): qty 2

## 2017-11-28 MED ORDER — LEVOFLOXACIN 750 MG PO TABS: 375.0000 mg | ORAL_TABLET | Freq: Every day | ORAL | Status: DC

## 2017-11-28 MED ORDER — HYDROMORPHONE HCL 1 MG/ML IJ SOLN
INTRAMUSCULAR | Status: AC
  Filled 2017-11-28: qty 1

## 2017-11-28 MED ORDER — SUCCINYLCHOLINE CHLORIDE 20 MG/ML IJ SOLN
INTRAMUSCULAR | Status: AC
  Filled 2017-11-28: qty 1

## 2017-11-28 MED ORDER — METOCLOPRAMIDE HCL 5 MG/ML IJ SOLN: 5.0000 mg | Freq: Three times a day (TID) | INTRAMUSCULAR | Status: DC | PRN

## 2017-11-28 MED ORDER — LACTULOSE 10 GM/15ML PO SOLN: 20.0000 g | Freq: Two times a day (BID) | ORAL | Status: DC

## 2017-11-28 MED ORDER — INSULIN GLARGINE 100 UNIT/ML ~~LOC~~ SOLN
12.0000 [IU] | Freq: Every day | SUBCUTANEOUS | Status: DC
  Administered 2017-11-28 – 2017-11-30 (×3): 12 [IU] via SUBCUTANEOUS
  Filled 2017-11-28 (×4): qty 0.12

## 2017-11-28 MED ORDER — GLYCOPYRROLATE 0.2 MG/ML IJ SOLN
INTRAMUSCULAR | Status: DC | PRN
  Administered 2017-11-28: 0.1 mg via INTRAVENOUS

## 2017-11-28 MED ORDER — HYDROMORPHONE HCL 1 MG/ML IJ SOLN
0.2500 mg | INTRAMUSCULAR | Status: DC | PRN
  Administered 2017-11-28: 0.25 mg via INTRAVENOUS

## 2017-11-28 MED ORDER — TRANEXAMIC ACID 1000 MG/10ML IV SOLN
INTRAVENOUS | Status: DC | PRN
  Administered 2017-11-28: 1000 mg via INTRAVENOUS

## 2017-11-28 MED ORDER — MG-PLUS PROTEIN 133 MG PO TABS: 1.0000 | ORAL_TABLET | Freq: Every day | ORAL | Status: DC

## 2017-11-28 MED ORDER — BISACODYL 5 MG PO TBEC: 5.0000 mg | DELAYED_RELEASE_TABLET | Freq: Every day | ORAL | Status: DC | PRN

## 2017-11-28 MED ORDER — ALENDRONATE SODIUM 70 MG PO TABS: 70.0000 mg | ORAL_TABLET | ORAL | Status: DC

## 2017-11-28 MED ORDER — BISACODYL 10 MG RE SUPP: 10.0000 mg | Freq: Every day | RECTAL | Status: DC | PRN

## 2017-11-28 SURGICAL SUPPLY — 38 items
BIT DRILL FLUTED FEMUR 4.2/3 (BIT) ×2 IMPLANT
BLADE TFNA HELICAL 95 STRL (Anchor) ×2 IMPLANT
BNDG COHESIVE 4X5 TAN STRL (GAUZE/BANDAGES/DRESSINGS) ×3 IMPLANT
CANISTER SUCT 1200ML W/VALVE (MISCELLANEOUS) ×3 IMPLANT
CHLORAPREP W/TINT 26ML (MISCELLANEOUS) ×6 IMPLANT
DRAPE C-ARMOR (DRAPES) IMPLANT
DRAPE INCISE 23X17 IOBAN STRL (DRAPES) ×2
DRAPE INCISE 23X17 STRL (DRAPES) ×1 IMPLANT
DRAPE INCISE IOBAN 23X17 STRL (DRAPES) ×1 IMPLANT
DRSG AQUACEL AG ADV 3.5X10 (GAUZE/BANDAGES/DRESSINGS) IMPLANT
DRSG AQUACEL AG ADV 3.5X14 (GAUZE/BANDAGES/DRESSINGS) IMPLANT
ELECT REM PT RETURN 9FT ADLT (ELECTROSURGICAL) ×3
ELECTRODE REM PT RTRN 9FT ADLT (ELECTROSURGICAL) ×1 IMPLANT
GAUZE PETRO XEROFOAM 1X8 (MISCELLANEOUS) IMPLANT
GAUZE SPONGE 4X4 12PLY STRL (GAUZE/BANDAGES/DRESSINGS) ×3 IMPLANT
GLOVE INDICATOR 8.0 STRL GRN (GLOVE) ×3 IMPLANT
GLOVE SURG ORTHO 8.5 STRL (GLOVE) ×3 IMPLANT
GOWN STRL REUS W/ TWL LRG LVL3 (GOWN DISPOSABLE) ×1 IMPLANT
GOWN STRL REUS W/TWL LRG LVL3 (GOWN DISPOSABLE) ×2
GOWN STRL REUS W/TWL LRG LVL4 (GOWN DISPOSABLE) ×3 IMPLANT
GUIDEWIRE 3.2X400 (WIRE) ×4 IMPLANT
KIT TURNOVER KIT A (KITS) ×3 IMPLANT
MAT ABSORB  FLUID 56X50 GRAY (MISCELLANEOUS) ×2
MAT ABSORB FLUID 56X50 GRAY (MISCELLANEOUS) ×1 IMPLANT
NAIL TROCH FIX 10X170 130 (Nail) ×2 IMPLANT
NDL SPNL 18GX3.5 QUINCKE PK (NEEDLE) ×1 IMPLANT
NEEDLE SPNL 18GX3.5 QUINCKE PK (NEEDLE) ×3 IMPLANT
NS IRRIG 500ML POUR BTL (IV SOLUTION) ×3 IMPLANT
PACK HIP COMPR (MISCELLANEOUS) ×3 IMPLANT
REAMER ROD DEEP FLUTE 2.5X950 (INSTRUMENTS) ×3 IMPLANT
SCREW LOCKING 5.0X38MM (Screw) ×2 IMPLANT
STAPLER SKIN PROX 35W (STAPLE) ×3 IMPLANT
SUCTION FRAZIER HANDLE 10FR (MISCELLANEOUS) ×2
SUCTION TUBE FRAZIER 10FR DISP (MISCELLANEOUS) ×1 IMPLANT
SUT VIC AB 0 CT1 36 (SUTURE) ×3 IMPLANT
SUT VIC AB 2-0 CT1 27 (SUTURE) ×2
SUT VIC AB 2-0 CT1 TAPERPNT 27 (SUTURE) ×1 IMPLANT
SYR 30ML LL (SYRINGE) ×3 IMPLANT

## 2017-11-28 NOTE — Anesthesia Preprocedure Evaluation (Addendum)
Anesthesia Evaluation  Patient identified by MRN, date of birth, ID band Patient awake    Reviewed: Allergy & Precautions, NPO status , Patient's Chart, lab work & pertinent test results  Airway Mallampati: II  TM Distance: >3 FB     Dental   Pulmonary neg pulmonary ROS,    Pulmonary exam normal        Cardiovascular Normal cardiovascular exam+ dysrhythmias Atrial Fibrillation      Neuro/Psych negative neurological ROS  negative psych ROS   GI/Hepatic GERD  Medicated,(+) Hepatitis -, C  Endo/Other  diabetes  Renal/GU   negative genitourinary   Musculoskeletal   Abdominal Normal abdominal exam  (+)   Peds negative pediatric ROS (+)  Hematology  (+) Blood dyscrasia, anemia ,   Anesthesia Other Findings Past Medical History: No date: Atrial fibrillation ([facility]) No date: BPH (benign prostatic hyperplasia) No date: Cancer ([facility])     Comment:  liver No date: Cirrhosis ([facility]) No date: Diabetes mellitus without complication ([facility]) No date: GERD (gastroesophageal reflux disease) No date: Hepatitis C   Liver transplant last year Plts 40 K  Reproductive/Obstetrics                           Anesthesia Physical Anesthesia Plan  ASA: IV  Anesthesia Plan: General   Post-op Pain Management:    Induction: Intravenous  PONV Risk Score and Plan:   Airway Management Planned: Oral ETT  Additional Equipment:   Intra-op Plan:   Post-operative Plan: Extubation in OR  Informed Consent: I have reviewed the patients History and Physical, chart, labs and discussed the procedure including the risks, benefits and alternatives for the proposed anesthesia with the patient or authorized representative who has indicated his/her understanding and acceptance.   Dental advisory given  Plan Discussed with: CRNA and Surgeon  Anesthesia Plan Comments:         Anesthesia Quick Evaluation

## 2017-11-28 NOTE — Anesthesia Procedure Notes (Signed)
Procedure Name: Intubation Performed by: Rolla Plate, CRNA Pre-anesthesia Checklist: Patient identified, Patient being monitored, Timeout performed, Emergency Drugs available and Suction available Patient Re-evaluated:Patient Re-evaluated prior to induction Oxygen Delivery Method: Circle system utilized Preoxygenation: Pre-oxygenation with 100% oxygen Induction Type: IV induction Ventilation: Mask ventilation without difficulty Laryngoscope Size: Mac and 3 Grade View: Grade I Tube type: Oral Tube size: 7.5 mm Number of attempts: 1 Airway Equipment and Method: Stylet Placement Confirmation: ETT inserted through vocal cords under direct vision,  positive ETCO2 and breath sounds checked- equal and bilateral Secured at: 22 cm Tube secured with: Tape Dental Injury: Teeth and Oropharynx as per pre-operative assessment

## 2017-11-28 NOTE — Op Note (Signed)
DATE OF SURGERY:  11/28/2017  TIME: 2:09 PM  PATIENT NAME:  Hector Mueller  AGE: 67 y.o.  PRE-OPERATIVE DIAGNOSIS:  fractured right hip- Intertrochanteric  POST-OPERATIVE DIAGNOSIS:  SAME  PROCEDURE:  INTRAMEDULLARY (IM) NAIL INTERTROCHANTRIC  SURGEON:  Camilah Spillman E  ASST:  EBL:  50 cc  COMPLICATIONS:  None  OPERATIVE IMPLANTS: Synthes trochanteric femoral nail  130*/42mm  with interlocking helical blade  95 mm and distal locking screw  38 mm.  PREOPERATIVE INDICATIONS:  Shawn Carattini is a 67 y.o. year old who fell and suffered a hip fracture. He was brought into the ER and then admitted and optimized and then elected for surgical intervention.    The risks benefits and alternatives were discussed with the patient including but not limited to the risks of nonoperative treatment, versus surgical intervention including infection, bleeding, nerve injury, malunion, nonunion, hardware prominence, hardware failure, need for hardware removal, blood clots, cardiopulmonary complications, morbidity, mortality, among others, and they were willing to proceed.    OPERATIVE PROCEDURE:  The patient was brought to the operating room and placed in the supine position.  General endotracheal anesthesia was administered, with a foley. He was placed on the fracture table.  Closed reduction was performed under C-arm guidance. The length of the femur was also measured using fluoroscopy. Time out was then performed after sterile prep and drape. He received preoperative antibiotics.  Incision was made proximal to the greater trochanter. A guidewire was placed in the appropriate position. Confirmation was made on AP and lateral views. The above-named nail was opened. I opened the proximal femur with a reamer. I then placed the nail by hand easily down. I did not need to ream the femur.  Once the nail was completely seated, I placed a guidepin into the femoral head into the center center position through a  second incision.  I measured the length, and then reamed the lateral cortex and up into the head. I then placed the helical blade. Slight compression was applied. Anatomic fixation achieved. Bone quality was mediocre.  I then secured the proximal interlock.  The distal locking screw was then placed and after confirming the position of the fracture fragments and hardware I then removed the instruments, and took final C-arm pictures AP and lateral the entire length of the leg. Anatomic reconstruction was achieved, and the wounds were irrigated copiously and closed with Vicryl  followed by staples and dry sterile dressing. Sponge and needle count were correct.   The patient was awakened and returned to PACU in stable and satisfactory condition. There no complications and the patient tolerated the procedure well.  He will be partial weightbearing as tolerated, and will be on Lovenox  For DVT prophylaxis.     Park Breed, M.D.

## 2017-11-28 NOTE — NC FL2 (Signed)
[place] LEVEL OF CARE SCREENING TOOL     IDENTIFICATION  Patient Name: Hector Mueller Birthdate: March 14, 1951 Sex: male Admission Date (Current Location): 11/27/2017  [place] and [place] Number:  Engineering geologist and Address:  [facility], [address], [place],  [address]      Provider Number: [id number]  Attending Physician Name and Address:  Vaughan Basta, *  Relative Name and Phone Number:       Current Level of Care: Hospital Recommended Level of Care: [place] Prior Approval Number:    Date Approved/Denied:   PASRR Number: [id number])  Discharge Plan: SNF    Current Diagnoses: Patient Active Problem List   Diagnosis Date Noted  . Hip fracture ([employer]) 11/27/2017    Orientation RESPIRATION BLADDER Height & Weight     Self, Time, Situation, Place  O2(2 Liters Oxygen. ) Continent Weight: 151 lb 14.4 oz (68.9 kg) Height:  5\' 9"  (175.3 cm)  BEHAVIORAL SYMPTOMS/MOOD NEUROLOGICAL BOWEL NUTRITION STATUS      Continent Diet(Diet: NPO for surgery to be advanced. )  AMBULATORY STATUS COMMUNICATION OF NEEDS Skin   Extensive Assist Verbally Surgical wounds                       Personal Care Assistance Level of Assistance  Bathing, Feeding, Dressing Bathing Assistance: Limited assistance Feeding assistance: Independent Dressing Assistance: Limited assistance     Functional Limitations Info  Sight, Hearing, Speech Sight Info: Adequate Hearing Info: Adequate Speech Info: Adequate    SPECIAL CARE FACTORS FREQUENCY  PT (By licensed PT), OT (By licensed OT)     PT Frequency: (5) OT Frequency: (5)            Contractures      Additional Factors Info  Code Status, Allergies Code Status Info: (Full Code. ) Allergies Info: (No Known Allergies. )           Current Medications (11/28/2017):  This is the current hospital active medication list Current Facility-Administered  Medications  Medication Dose Route Frequency Provider Last Rate Last Dose  . 0.9 %  sodium chloride infusion   Intravenous Continuous Amelia Jo, MD 75 mL/hr at 11/28/17 1107    . [MAR Hold] acetaminophen (TYLENOL) tablet 650 mg  650 mg Oral Q6H PRN Amelia Jo, MD       Or  . Doug Sou Hold] acetaminophen (TYLENOL) suppository 650 mg  650 mg Rectal Q6H PRN Amelia Jo, MD      . Doug Sou Hold] bisacodyl (DULCOLAX) EC tablet 5 mg  5 mg Oral Daily PRN Amelia Jo, MD      . Doug Sou Hold] ceFAZolin (ANCEF) IVPB 2g/100 mL premix  2 g Intravenous On Call to OR Amelia Jo, MD      . Doug Sou Hold] cholecalciferol (VITAMIN D) tablet 2,000 Units  2,000 Units Oral Daily Amelia Jo, MD      . Doug Sou Hold] clindamycin (CLEOCIN) IVPB 600 mg  600 mg Intravenous On Call to OR Amelia Jo, MD      . Doug Sou Hold] docusate sodium (COLACE) capsule 100 mg  100 mg Oral BID Amelia Jo, MD      . Doug Sou Hold] HYDROcodone-acetaminophen (NORCO/VICODIN) 5-325 MG per tablet 1-2 tablet  1-2 tablet Oral Q4H PRN Amelia Jo, MD   2 tablet at 11/28/17 0239  . [MAR Hold] HYDROmorphone (DILAUDID) injection 0.5 mg  0.5 mg Intravenous Q4H PRN Amelia Jo, MD   0.5 mg at 11/28/17  1107  . [MAR Hold] insulin aspart (novoLOG) injection 0-5 Units  0-5 Units Subcutaneous QHS Amelia Jo, MD      . Doug Sou Hold] insulin aspart (novoLOG) injection 0-9 Units  0-9 Units Subcutaneous TID WC Amelia Jo, MD   3 Units at 11/28/17 0827  . [MAR Hold] insulin glargine (LANTUS) injection 16 Units  16 Units Subcutaneous QPC lunch Amelia Jo, MD      . Doug Sou Hold] lactulose ([place]) 10 GM/15ML solution 20 g  20 g Oral BID Amelia Jo, MD      . Doug Sou Hold] LORazepam (ATIVAN) injection 0.5 mg  0.5 mg Intravenous BID BM & HS PRN Amelia Jo, MD      . Doug Sou Hold] magnesium oxide (MAG-OX) tablet 400 mg  400 mg Oral Daily Amelia Jo, MD      . Doug Sou Hold] Melatonin TABS 5 mg  5 mg Oral QHS Amelia Jo, MD      . Doug Sou Hold] mirtazapine  (REMERON) tablet 15 mg  15 mg Oral QHS Amelia Jo, MD      . Doug Sou Hold] multivitamin with minerals tablet 1 tablet  1 tablet Oral Daily Amelia Jo, MD      . Doug Sou Hold] mycophenolate (CELLCEPT) capsule 250 mg  250 mg Oral BID Amelia Jo, MD      . Doug Sou Hold] ondansetron [facility]) tablet 4 mg  4 mg Oral Q6H PRN Amelia Jo, MD       Or  . Doug Sou Hold] ondansetron [facility]) injection 4 mg  4 mg Intravenous Q6H PRN Amelia Jo, MD      . Doug Sou Hold] sildenafil (REVATIO) tablet 40 mg  40 mg Oral TID Amelia Jo, MD      . Doug Sou Hold] spironolactone (ALDACTONE) tablet 50 mg  50 mg Oral Daily Amelia Jo, MD      . Doug Sou Hold] traZODone (DESYREL) tablet 25 mg  25 mg Oral QHS PRN Amelia Jo, MD         Discharge Medications: Please see discharge summary for a list of discharge medications.  Relevant Imaging Results:  Relevant Lab Results:   Additional Information (SSN: [id number])  Maryjane Benedict, Veronia Beets, LCSW

## 2017-11-28 NOTE — Anesthesia Postprocedure Evaluation (Signed)
Anesthesia Post Note  Patient: Hector Mueller  Procedure(s) Performed: INTRAMEDULLARY (IM) NAIL INTERTROCHANTRIC (Right )  Patient location during evaluation: PACU Anesthesia Type: General Level of consciousness: awake and alert and oriented Pain management: pain level controlled Vital Signs Assessment: post-procedure vital signs reviewed and stable Respiratory status: spontaneous breathing Cardiovascular status: blood pressure returned to baseline Anesthetic complications: no     Last Vitals:  Vitals:   11/28/17 1534 11/28/17 1537  BP:  (!) 158/81  Pulse: 80 80  Resp: 16 14  Temp:    SpO2: 100% 100%    Last Pain:  Vitals:   11/28/17 1519  TempSrc:   PainSc: 6                  Merline Perkin

## 2017-11-28 NOTE — H&P (Signed)
THE PATIENT WAS SEEN PRIOR TO SURGERY TODAY.  HISTORY, ALLERGIES, HOME MEDICATIONS AND OPERATIVE PROCEDURE WERE REVIEWED. RISKS AND BENEFITS OF SURGERY DISCUSSED WITH PATIENT AGAIN.  NO CHANGES FROM INITIAL HISTORY AND PHYSICAL NOTED.    

## 2017-11-28 NOTE — Anesthesia Post-op Follow-up Note (Signed)
Anesthesia QCDR form completed.        

## 2017-11-28 NOTE — Clinical Social Work Placement (Signed)
   CLINICAL SOCIAL WORK PLACEMENT  NOTE  Date:  11/28/2017  Patient Details  Name: Hector Mueller MRN: [id number] Date of Birth: April 15, 1950  Clinical Social Work is seeking post-discharge placement for this patient at the [place] level of care (*CSW will initial, date and re-position this form in  chart as items are completed):  Yes   Patient/family provided with [facility] Work Department's list of facilities offering this level of care within the geographic area requested by the patient (or if unable, by the patient's family).  Yes   Patient/family informed of their freedom to choose among providers that offer the needed level of care, that participate in Medicare, Medicaid or managed care program needed by the patient, have an available bed and are willing to accept the patient.  Yes   Patient/family informed of [facility]'s ownership interest in [facility] and [facility], as well as of the fact that they are under no obligation to receive care at these facilities.  PASRR submitted to EDS on 11/28/17     PASRR number received on 11/28/17     Existing PASRR number confirmed on       FL2 transmitted to all facilities in geographic area requested by pt/family on 11/28/17     FL2 transmitted to all facilities within larger geographic area on       Patient informed that his/her managed care company has contracts with or will negotiate with certain facilities, including the following:            Patient/family informed of bed offers received.  Patient chooses bed at       Physician recommends and patient chooses bed at      Patient to be transferred to   on  .  Patient to be transferred to facility by       Patient family notified on   of transfer.  Name of family member notified:        PHYSICIAN       Additional Comment:    _______________________________________________ Aleane Wesenberg, Veronia Beets, LCSW 11/28/2017, 11:50 AM

## 2017-11-28 NOTE — Transfer of Care (Signed)
Immediate Anesthesia Transfer of Care Note  Patient: Hector Mueller  Procedure(s) Performed: INTRAMEDULLARY (IM) NAIL INTERTROCHANTRIC (Right )  Patient Location: PACU  Anesthesia Type:General  Level of Consciousness: awake  Airway & Oxygen Therapy: Patient Spontanous Breathing and Patient connected to face mask oxygen  Post-op Assessment: Report given to RN and Post -op Vital signs reviewed and stable  Post vital signs: Reviewed  Last Vitals:  Vitals Value Taken Time  BP 146/105 11/28/2017  2:17 PM  Temp 36.7 C 11/28/2017  2:17 PM  Pulse 80 11/28/2017  2:18 PM  Resp 16 11/28/2017  2:18 PM  SpO2 100 % 11/28/2017  2:18 PM  Vitals shown include unvalidated device data.  Last Pain:  Vitals:   11/28/17 1208  TempSrc: Tympanic  PainSc: 8          Complications: No apparent anesthesia complications

## 2017-11-28 NOTE — Clinical Social Work Note (Signed)
Clinical Social Work Assessment  Patient Details  Name: Hector Mueller MRN: [id number] Date of Birth: 10/22/1950  Date of referral:  11/28/17               Reason for consult:  Facility Placement                Permission sought to share information with:  Chartered certified accountant granted to share information::  Yes, Verbal Permission Granted  Name::      [place]::   [place]   Relationship::     Contact Information:     Housing/Transportation Living arrangements for the past 2 months:  [place] of Information:  Patient, Spouse Patient Interpreter Needed:  None Criminal Activity/Legal Involvement Pertinent to Current Situation/Hospitalization:  No - Comment as needed Significant Relationships:  Spouse Lives with:  Spouse Do you feel safe going back to the place where you live?  Yes Need for family participation in patient care:  Yes (Comment)  Care giving concerns:  Patient lives in [place] with his wife Hector Mueller.    Social Worker assessment / plan:  Holiday representative ([place]) reviewed chart and noted that patient has a hip fracture. Surgery and PT are pending. CSW met with patient and his wife Hector Mueller was at bedside. Patient was alert and oriented X4 and was laying in the bed. CSW introduced self and explained role of CSW department. Per patient he lives in [place] with his wife. Per wife patient will go for surgery today with Dr. Sabra Heck. CSW explained that after surgery PT will evaluate patient and make a recommendation of home health or SNF. CSW explained that medicare requires a 3 night qualifying inpatient stay in a hospital in order to pay for SNF. Patient was admitted to inpatient on 11/27/17. Per patient he is agreeable to SNF search in [facility] and prefers [facility] if SNF is needed. FL2 complete and faxed out. Bundle case manager aware of above. CSW will continue to follow and assist as needed.  Employment  status:  Retired Forensic scientist:  Medicare PT Recommendations:  Not assessed at this time [place] / Referral to community resources:  [place]  Patient/Family's Response to care:  Patient is agreeable to [facility] in [place].   Patient/Family's Understanding of and Emotional Response to Diagnosis, Current Treatment, and Prognosis:  Patient and his wife were very pleasant and thanked CSW for assistance.   Emotional Assessment Appearance:  Appears stated age Attitude/Demeanor/Rapport:    Affect (typically observed):  Accepting, Adaptable, Pleasant Orientation:  Oriented to Self, Oriented to Place, Oriented to  Time, Oriented to Situation Alcohol / Substance use:  Not Applicable Psych involvement (Current and /or in the community):  No (Comment)  Discharge Needs  Concerns to be addressed:  Discharge Planning Concerns Readmission within the last 30 days:  No Current discharge risk:  Dependent with Mobility Barriers to Discharge:  Continued Medical Work up   [employer], Veronia Beets, LCSW 11/28/2017, 11:51 AM

## 2017-11-28 NOTE — Consult Note (Signed)
ORTHOPAEDIC CONSULTATION  REQUESTING PHYSICIAN: Vaughan Basta, *  Chief Complaint: Right hip pain  HPI: Hector Mueller is a 67 y.o. male who complains of right hip pain after a fall in his garden last evening.  Patient was brought to the emergency room where exam and x-rays revealed an intertrochanteric fracture of the right hip.  He was admitted for medical evaluation and probable surgery.  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  Been doing well recently.  He was taken off warfarin in about a month ago.  Does have atrial fibrillation.  Platelets were noted to be about 75,000.  Been cleared for surgery by the medical service.  The risks and benefits postop protocol were discussed with the patient and his wife and they wish to proceed with surgery.  Past Medical History:  Diagnosis Date  . Atrial fibrillation ([place])   . BPH (benign prostatic hyperplasia)   . Cancer ([place])    liver  . Cirrhosis ([place])   . Diabetes mellitus without complication ([place])   . GERD (gastroesophageal reflux disease)   . Hepatitis C    Past Surgical History:  Procedure Laterality Date  . ANKLE FRACTURE SURGERY Bilateral   . BACK SURGERY    . CLAVICLE SURGERY    . LIVER SURGERY     Social History   Socioeconomic History  . Marital status: Married    Spouse name: Not on file  . Number of children: Not on file  . Years of education: Not on file  . Highest education level: Not on file  Occupational History  . Not on file  Social Needs  . Financial resource strain: Not on file  . Food insecurity:    Worry: Not on file    Inability: Not on file  . Transportation needs:    Medical: Not on file    Non-medical: Not on file  Tobacco Use  . Smoking status: Never Smoker  . Smokeless tobacco: Never Used  Substance and Sexual Activity  . Alcohol use: No  . Drug use: Not on file  . Sexual activity: Not on file  Lifestyle  . Physical activity:    Days per week: Not on  file    Minutes per session: Not on file  . Stress: Not on file  Relationships  . Social connections:    Talks on phone: Not on file    Gets together: Not on file    Attends religious service: Not on file    Active member of club or organization: Not on file    Attends meetings of clubs or organizations: Not on file    Relationship status: Not on file  Other Topics Concern  . Not on file  Social History Narrative  . Not on file   History reviewed. No pertinent family history. No Known Allergies Prior to Admission medications   Medication Sig Start Date End Date Taking? Authorizing Provider  alendronate (FOSAMAX) 70 MG tablet  10/27/17  Yes [provider]  Cholecalciferol (VITAMIN D3) 2000 units capsule Take 2,000 Units by mouth daily.   Yes [provider]  cycloSPORINE modified (NEORAL) 100 MG capsule Take 100 mg by mouth 2 (two) times daily. Take along with 3 x 25 mg cap twice a day   Yes [provider]  cycloSPORINE modified (NEORAL) 25 MG capsule Take 75 mg by mouth 2 (two) times daily. Take with 100 mg cap twice a day   Yes [provider]  insulin aspart (NOVOLOG) 100 UNIT/ML FlexPen Inject 6 Units into the skin 3 (three) times daily with meals.  12/09/16 02/06/18 Yes [provider]  Insulin Glargine (LANTUS SOLOSTAR) 100 UNIT/ML Solostar Pen INJECT 12 UNITS SUBCUTANEOUSLY (UNDER SKIN) NIGHTLY 12/09/16 12/09/17 Yes [provider]  Melatonin 3 MG TABS Take by mouth. 02/06/17  Yes [provider]  mirtazapine (REMERON) 15 MG tablet Take 15 mg by mouth at bedtime.  10/18/17  Yes [provider]  mycophenolate (CELLCEPT) 250 MG capsule TAKE 2 CAPSULES (500MG ) BY MOUTH TWICE DAILY 03/16/17 03/16/18 Yes [provider]  sildenafil (REVATIO) 20 MG tablet Take 40 mg by mouth 3 (three) times daily.  10/17/17  Yes [provider]  Specialty Vitamins Products (MAGNESIUM, AMINO ACID CHELATE,) 133 MG tablet Take  by mouth. 03/02/17  Yes [provider]  baclofen (LIORESAL) 10 MG tablet Take 5-10 mg by mouth 2 (two) times daily as needed. 05/31/16   [provider]  lactulose (CHRONULAC) 10 GM/15ML solution Take 30 mLs by mouth 2 (two) times daily. 04/06/16   [provider]  levofloxacin (LEVAQUIN) 500 MG tablet  06/17/16   [provider]  Multiple Vitamin (MULTIVITAMIN) tablet Take 1 tablet by mouth daily.    [provider]  rifaximin (XIFAXAN) 550 MG TABS tablet Take 550 mg by mouth. 07/07/16   [provider]  spironolactone (ALDACTONE) 50 MG tablet Take 50 mg by mouth daily. 01/07/14   [provider]   Dg Chest 1 View  Result Date: 11/27/2017 CLINICAL DATA:  Fall with hip fracture EXAM: CHEST  1 VIEW COMPARISON:  01/26/2015 FINDINGS: Partially visualized lower thoracic rods. No acute airspace disease or effusion. Normal cardiomediastinal silhouette. Aortic atherosclerosis. No pneumothorax. IMPRESSION: No active disease. Electronically Signed   By: Donavan Foil M.D.   On: 11/27/2017 20:47   Ct Head Wo Contrast  Result Date: 11/27/2017 CLINICAL DATA:  Tripped and fell, hitting head. EXAM: CT HEAD WITHOUT CONTRAST CT CERVICAL SPINE WITHOUT CONTRAST TECHNIQUE: Multidetector CT imaging of the head and cervical spine was performed following the standard protocol without intravenous contrast. Multiplanar CT image reconstructions of the cervical spine were also generated. COMPARISON:  None. FINDINGS: CT HEAD FINDINGS Brain: No evidence for acute infarction, hemorrhage, mass lesion, hydrocephalus, or extra-axial fluid. Mild atrophy. Hypoattenuation of white matter, likely small vessel disease. Chronic appearing RIGHT thalamic lacunar infarct. Vascular: Calcification of the cavernous internal carotid arteries consistent with cerebrovascular atherosclerotic disease. No signs of intracranial large vessel occlusion. Skull: Normal. Negative for fracture or  focal lesion. Sinuses/Orbits: No acute finding. Other: Large RIGHT-sided scalp hematoma over the temporalis muscle region. CT CERVICAL SPINE FINDINGS Alignment: 1-2 mm of facet mediated retrolisthesis at C4-5 and 1-2 mm of facet mediated anterolisthesis at C7-T1, otherwise anatomic. No traumatic subluxation. Skull base and vertebrae: No acute fracture. No primary bone lesion or focal pathologic process. Soft tissues and spinal canal: No prevertebral fluid or swelling. No visible canal hematoma. Disc levels: Multilevel spondylosis, with advanced disc space narrowing, most pronounced at C6-C7. Upper chest: Unremarkable. Other: None. IMPRESSION: No skull fracture or intracranial hemorrhage. Large RIGHT-sided scalp hematoma. Multilevel spondylosis. No cervical spine fracture or traumatic subluxation. Electronically Signed   By: Staci Righter M.D.   On: 11/27/2017 21:28   Ct Cervical Spine Wo Contrast  Result Date: 11/27/2017 CLINICAL DATA:  Tripped and fell, hitting head. EXAM: CT HEAD WITHOUT CONTRAST CT CERVICAL SPINE WITHOUT CONTRAST TECHNIQUE: Multidetector CT imaging of the head and  cervical spine was performed following the standard protocol without intravenous contrast. Multiplanar CT image reconstructions of the cervical spine were also generated. COMPARISON:  None. FINDINGS: CT HEAD FINDINGS Brain: No evidence for acute infarction, hemorrhage, mass lesion, hydrocephalus, or extra-axial fluid. Mild atrophy. Hypoattenuation of white matter, likely small vessel disease. Chronic appearing RIGHT thalamic lacunar infarct. Vascular: Calcification of the cavernous internal carotid arteries consistent with cerebrovascular atherosclerotic disease. No signs of intracranial large vessel occlusion. Skull: Normal. Negative for fracture or focal lesion. Sinuses/Orbits: No acute finding. Other: Large RIGHT-sided scalp hematoma over the temporalis muscle region. CT CERVICAL SPINE FINDINGS Alignment: 1-2 mm of facet mediated  retrolisthesis at C4-5 and 1-2 mm of facet mediated anterolisthesis at C7-T1, otherwise anatomic. No traumatic subluxation. Skull base and vertebrae: No acute fracture. No primary bone lesion or focal pathologic process. Soft tissues and spinal canal: No prevertebral fluid or swelling. No visible canal hematoma. Disc levels: Multilevel spondylosis, with advanced disc space narrowing, most pronounced at C6-C7. Upper chest: Unremarkable. Other: None. IMPRESSION: No skull fracture or intracranial hemorrhage. Large RIGHT-sided scalp hematoma. Multilevel spondylosis. No cervical spine fracture or traumatic subluxation. Electronically Signed   By: Staci Righter M.D.   On: 11/27/2017 21:28   Dg Hip Unilat W Or Wo Pelvis 2-3 Views Right  Result Date: 11/27/2017 CLINICAL DATA:  Right hip pain after fall EXAM: DG HIP (WITH OR WITHOUT PELVIS) 2-3V RIGHT COMPARISON:  None. FINDINGS: Limited by positioning. Surgical hardware in the lumbosacral spine. Pubic symphysis appears intact. Both femoral heads project in joint. Acute slightly displaced right intertrochanteric fracture. IMPRESSION: Acute displaced right intertrochanteric fracture Electronically Signed   By: Donavan Foil M.D.   On: 11/27/2017 20:46    Positive ROS: All other systems have been reviewed and were otherwise negative with the exception of those mentioned in the HPI and as above.  Physical Exam: General: Alert, no acute distress Cardiovascular: No pedal edema Respiratory: No cyanosis, no use of accessory musculature GI: No organomegaly, abdomen is soft and non-tender Skin: No lesions in the area of chief complaint Neurologic: Sensation intact distally Psychiatric: Patient is competent for consent with normal mood and affect Lymphatic: No axillary or cervical lymphadenopathy  MUSCULOSKELETAL: Right leg is shortened and rotated.  There is severe pain with range of motion.  Neurovascular status good distally.  The skin is intact.  No other  injuries are noted.  Assessment: Trochanteric fracture right hip  Plan: Intramedullary nailing right hip with trochanteric fixation nail.    Park Breed, MD [phone]   11/28/2017 12:29 PM

## 2017-11-28 NOTE — Progress Notes (Signed)
Patient went to surgery. NSL. IV ABX sent up with patient. No consent signed, MD hasn't seen patient and no orders in place for a consent. [facility] tractions removed for transport. Wife at bedside.

## 2017-11-29 ENCOUNTER — Encounter: Payer: Self-pay | Admitting: Specialist

## 2017-11-29 LAB — CBC
HCT: 24.1 % — ABNORMAL LOW (ref 40.0–52.0)
Hemoglobin: 8.3 g/dL — ABNORMAL LOW (ref 13.0–18.0)
MCH: 31.9 pg (ref 26.0–34.0)
MCHC: 34.4 g/dL (ref 32.0–36.0)
MCV: 92.7 fL (ref 80.0–100.0)
Platelets: 64 10*3/uL — ABNORMAL LOW (ref 150–440)
RBC: 2.6 MIL/uL — AB (ref 4.40–5.90)
RDW: 13.9 % (ref 11.5–14.5)
WBC: 5.1 10*3/uL (ref 3.8–10.6)

## 2017-11-29 LAB — HIV ANTIBODY (ROUTINE TESTING W REFLEX): HIV SCREEN 4TH GENERATION: NONREACTIVE

## 2017-11-29 LAB — BASIC METABOLIC PANEL
ANION GAP: 6 (ref 5–15)
BUN: 37 mg/dL — ABNORMAL HIGH (ref 8–23)
CO2: 20 mmol/L — ABNORMAL LOW (ref 22–32)
Calcium: 8.5 mg/dL — ABNORMAL LOW (ref 8.9–10.3)
Chloride: 109 mmol/L (ref 98–111)
Creatinine, Ser: 1.8 mg/dL — ABNORMAL HIGH (ref 0.61–1.24)
GFR calc non Af Amer: 37 mL/min — ABNORMAL LOW (ref >60)
GFR, EST AFRICAN AMERICAN: 43 mL/min — AB (ref >60)
Glucose, Bld: 151 mg/dL — ABNORMAL HIGH (ref 70–99)
Potassium: 4.7 mmol/L (ref 3.5–5.1)
SODIUM: 135 mmol/L (ref 135–145)

## 2017-11-29 LAB — GLUCOSE, CAPILLARY
GLUCOSE-CAPILLARY: 150 mg/dL — AB (ref 70–99)
GLUCOSE-CAPILLARY: 153 mg/dL — AB (ref 70–99)
Glucose-Capillary: 200 mg/dL — ABNORMAL HIGH (ref 70–99)
Glucose-Capillary: 93 mg/dL (ref 70–99)

## 2017-11-29 NOTE — Evaluation (Signed)
Physical Therapy Evaluation Patient Details Name: Hector Mueller MRN: [id number] DOB: 18-May-1950 Today's Date: 11/29/2017   History of Present Illness  Patient is a 67 year old male admitted s/p IM nailing of R hip following a fall at home.  PMH includes Hep C, cirrhosis, CA, BPH and atrial fibrillation.  Clinical Impression  Patient is a 67 year old male who lives in a one story home with his wife.  He has recently discharged from IP rehab and uses a RW for ambulation.  Pt required max A +2 to perform bed mobility but was able to sit at EOB independently once his feet were on the floor.  Pt's wife is able to provide some assistance but will not be able to help with all transfers due to difference in stature of pt versus his wife.  PT and PT tech provided min A for stand pivot transfer and pt appeared to be more confident in transfer with PT tech helping.  Pt presented with good overall strength of UE's and LE's with the exception of post-op pain of L LE.  He reported 8/10 pain with mobililty.  Pt was able to complete some sitting there ex when at EOB and required assistance to complete sets of 10.  PT reviewed PWB status and pt demonstrated understanding.  Pt will continue to benefit from skilled PT with focus on strength, tolerance to activity, pain management, safe transfers and ROM.    Follow Up Recommendations SNF    Equipment Recommendations  None recommended by PT    Recommendations for Other Services       Precautions / Restrictions Precautions Precautions: Fall Precaution Comments: High Fall risk Restrictions Weight Bearing Restrictions: Yes RLE Weight Bearing: Partial weight bearing      Mobility  Bed Mobility Overal bed mobility: Needs Assistance Bed Mobility: Supine to Sit     Supine to sit: Max assist;+2 for physical assistance     General bed mobility comments: PT and pt's wife assisted pt in bringing LE's over EOB and provided back support until pt could get feet on  floor.  Pt very apprehensive.  Transfers Overall transfer level: Needs assistance Equipment used: Rolling walker (2 wheeled) Transfers: Stand Pivot Transfers   Stand pivot transfers: +2 physical assistance;Min assist;From elevated surface       General transfer comment: Pt able to rise from bedside with min A and PT provided VC's for sequencing with RW during turns.  PT monitored PWB status and pt was able to maintain using RW,  Ambulation/Gait                Stairs            Wheelchair Mobility    Modified Rankin (Stroke Patients Only)       Balance Overall balance assessment: Needs assistance Sitting-balance support: Bilateral upper extremity supported;Feet supported Sitting balance-Leahy Scale: Fair     Standing balance support: Bilateral upper extremity supported Standing balance-Leahy Scale: Fair                               Pertinent Vitals/Pain Pain Assessment: 0-10 Pain Score: 8  Pain Location: R hip with mobility Pain Intervention(s): Monitored during session    Home Living Family/patient expects to be discharged to:: Private residence Living Arrangements: Spouse/significant other Available Help at Discharge: Family;Available 24 hours/day Type of Home: House Home Access: Stairs to enter Entrance Stairs-Rails: Can reach both Entrance Stairs-Number of  Steps: 7 Home Layout: One level Home Equipment: Clinical cytogeneticist - 2 wheels;Walker - 4 wheels      Prior Function Level of Independence: Independent with assistive device(s)               Hand Dominance        Extremity/Trunk Assessment   Upper Extremity Assessment Upper Extremity Assessment: Overall WFL for tasks assessed    Lower Extremity Assessment Lower Extremity Assessment: Overall WFL for tasks assessed;RLE deficits/detail RLE Deficits / Details: Mildly limited due to post-op pain. RLE: Unable to fully assess due to pain RLE Sensation: history of  peripheral neuropathy    Cervical / Trunk Assessment Cervical / Trunk Assessment: Normal  Communication      Cognition Arousal/Alertness: Awake/alert Behavior During Therapy: WFL for tasks assessed/performed Overall Cognitive Status: Within Functional Limits for tasks assessed                                 General Comments: Follows commands consistently.      General Comments      Exercises General Exercises - Lower Extremity Ankle Circles/Pumps: AROM;10 reps;Both;Seated Short Arc Quad: Strengthening;Right;10 reps;Seated Hip ABduction/ADduction: Strengthening;10 reps;Both;Seated(Pillow squeezes.)   Assessment/Plan    PT Assessment Patient needs continued PT services  PT Problem List Decreased strength;Decreased mobility;Decreased balance;Pain;Decreased activity tolerance;Decreased range of motion       PT Treatment Interventions DME instruction;Therapeutic activities;Gait training;Therapeutic exercise;Patient/family education;Stair training;Balance training;Functional mobility training;Neuromuscular re-education    PT Goals (Current goals can be found in the Care Plan section)  Acute Rehab PT Goals Patient Stated Goal: To return home as soon as possible. PT Goal Formulation: With patient/family Time For Goal Achievement: 12/13/17 Potential to Achieve Goals: Good    Frequency BID   Barriers to discharge        Co-evaluation               AM-PAC PT "6 Clicks" Daily Activity  Outcome Measure Difficulty turning over in bed (including adjusting bedclothes, sheets and blankets)?: A Lot Difficulty moving from lying on back to sitting on the side of the bed? : A Lot Difficulty sitting down on and standing up from a chair with arms (e.g., wheelchair, bedside commode, etc,.)?: A Lot Help needed moving to and from a bed to chair (including a wheelchair)?: A Lot Help needed walking in hospital room?: A Lot Help needed climbing 3-5 steps with a railing?  : A Lot 6 Click Score: 12    End of Session Equipment Utilized During Treatment: Gait belt Activity Tolerance: Patient limited by pain Patient left: in chair;with call bell/phone within reach;with family/visitor present Nurse Communication: Mobility status PT Visit Diagnosis: Unsteadiness on feet (R26.81);Muscle weakness (generalized) (M62.81);Pain Pain - Right/Left: Right Pain - part of body: Hip    Time: 1339-1410 PT Time Calculation (min) (ACUTE ONLY): 31 min   Charges:   PT Evaluation $PT Eval Moderate Complexity: 1 Mod PT Treatments $Therapeutic Exercise: 8-22 mins       [name], PT, DPT   [name] 11/29/2017, 2:23 PM

## 2017-11-29 NOTE — Progress Notes (Signed)
Physical Therapy Treatment Patient Details Name: Hector Mueller MRN: [id number] DOB: 01/14/1951 Today's Date: 11/29/2017    History of Present Illness Patient is a 67 year old male admitted s/p IM nailing of R hip following a fall at home.  PMH includes Hep C, cirrhosis, CA, BPH and atrial fibrillation.    PT Comments    Patient still apprehensive during treatment but demonstrated more confidence with standing pivot transfer.  PT tech provided +2 assist for safety and PT provided VC's for sequencing with RW.  Pt able to demonstrate understanding of there ex with less VC's and manual cues and report of slightly lower pain level.  PT assisted pt in return to bed and he was able to scoot up in bed using unilateral bridge with L LE.  PWB status was maintained throughout transfers and bed mobility with some VC's for reminder.  Pt positioned with traction on R LE and heels elevated at end of session.  Pt talked about his personal goals and stated that he would like to get back to walking his dog as soon as possible.  Pt will continue to benefit from skilled PT with focus on return of strength, tolerance to activity and pain management.  Follow Up Recommendations  SNF     Equipment Recommendations  None recommended by PT    Recommendations for Other Services       Precautions / Restrictions Precautions Precautions: Fall Precaution Comments: High Fall risk Restrictions Weight Bearing Restrictions: Yes RLE Weight Bearing: Partial weight bearing    Mobility  Bed Mobility Overal bed mobility: Needs Assistance Bed Mobility: Supine to Sit     Supine to sit: Max assist;+2 for physical assistance     General bed mobility comments: PT assisted pt in bringing LE's over EOB and VC's for body mechanics to scoot up in bed.  Pt able to scoot up in bed without physical assist using only UE's and L LE.  Transfers Overall transfer level: Needs assistance Equipment used: Rolling walker (2  wheeled) Transfers: Stand Pivot Transfers   Stand pivot transfers: +2 physical assistance;Min assist;From elevated surface       General transfer comment: Pt able to rise from recliner with min A and PT provided VC's for sequencing with RW during turns.  PT monitored PWB status and pt was able to maintain using RW.  Pt appeared to be more confident with transfer this time.  Ambulation/Gait                 Stairs             Wheelchair Mobility    Modified Rankin (Stroke Patients Only)       Balance Overall balance assessment: Needs assistance Sitting-balance support: Bilateral upper extremity supported;Feet supported Sitting balance-Leahy Scale: Fair     Standing balance support: Bilateral upper extremity supported Standing balance-Leahy Scale: Fair                              Cognition Arousal/Alertness: Awake/alert Behavior During Therapy: WFL for tasks assessed/performed Overall Cognitive Status: Within Functional Limits for tasks assessed                                 General Comments: Follows commands consistently.      Exercises General Exercises - Lower Extremity Ankle Circles/Pumps: AROM;10 reps;Seated;Both Quad Sets: Right;Strengthening;10 reps;Seated Short Arc Quad:  Strengthening;Right;10 reps;Seated Long Arc Quad: Strengthening;Right;10 reps;Seated Hip ABduction/ADduction: Strengthening;10 reps;Both;Seated(Pillow squeezes.)    General Comments        Pertinent Vitals/Pain Pain Assessment: 0-10 Pain Score: 6  Pain Location: R hip with mobility Pain Intervention(s): Limited activity within patient's tolerance;Monitored during session    [place] expects to be discharged to:: Private residence Living Arrangements: Spouse/significant other Available Help at Discharge: Family;Available 24 hours/day Type of Home: House Home Access: Stairs to enter Entrance Stairs-Rails: Can reach  both Home Layout: One level Home Equipment: Clinical cytogeneticist - 2 wheels;Walker - 4 wheels      Prior Function Level of Independence: Independent with assistive device(s)          PT Goals (current goals can now be found in the care plan section) Acute Rehab PT Goals Patient Stated Goal: To return home as soon as possible. PT Goal Formulation: With patient/family Time For Goal Achievement: 12/13/17 Potential to Achieve Goals: Good Progress towards PT goals: Progressing toward goals    Frequency    BID      PT Plan Current plan remains appropriate    Co-evaluation              AM-PAC PT "6 Clicks" Daily Activity  Outcome Measure  Difficulty turning over in bed (including adjusting bedclothes, sheets and blankets)?: A Lot Difficulty moving from lying on back to sitting on the side of the bed? : A Lot Difficulty sitting down on and standing up from a chair with arms (e.g., wheelchair, bedside commode, etc,.)?: A Lot Help needed moving to and from a bed to chair (including a wheelchair)?: A Lot Help needed walking in hospital room?: A Lot Help needed climbing 3-5 steps with a railing? : A Lot 6 Click Score: 12    End of Session Equipment Utilized During Treatment: Gait belt Activity Tolerance: Patient limited by pain Patient left: with call bell/phone within reach;with bed alarm set;in bed Nurse Communication: Mobility status PT Visit Diagnosis: Unsteadiness on feet (R26.81);Muscle weakness (generalized) (M62.81);Pain Pain - Right/Left: Right Pain - part of body: Hip     Time: 6962-9528 PT Time Calculation (min) (ACUTE ONLY): 24 min  Charges:  $Therapeutic Exercise: 8-22 mins $Therapeutic Activity: 8-22 mins                     [name], PT, DPT    [name] 11/29/2017, 4:53 PM

## 2017-11-29 NOTE — Progress Notes (Signed)
PT Cancellation Note  Patient Details Name: Hector Mueller MRN: [id number] DOB: 1950-07-09   Cancelled Treatment:    Reason Eval/Treat Not Completed: Patient declined, no reason specified.  Patient initially agreed to participate with therapy but started showing signs of nausea immediately and requested that PT return after lunch.  Will re-attempt later when time allows.   [name], PT, DPT 11/29/2017, 11:41 AM

## 2017-11-29 NOTE — Progress Notes (Signed)
PT Cancellation Note  Patient Details Name: Hector Mueller MRN: [id number] DOB: [birth year]   Cancelled Treatment:    Reason Eval/Treat Not Completed: Patient declined, no reason specified(Consult received and chart reviewed.  Patient currently reporting significant nausea (RN aware); requests therapist hold and re-attempt at later time this date.  Will continue efforts as appropriate.)   Jonella Redditt H. Owens Shark, PT, DPT, NCS 11/29/17, 9:46 AM [phone]

## 2017-11-29 NOTE — Progress Notes (Signed)
PT is recommending SNF. Clinical Education officer, museum (CSW) presented bed offers to patient and his wife Bethena Roys. They chose [facility]. [facility] liaison is aware of above.   [employer], LCSW [phone]

## 2017-11-29 NOTE — Progress Notes (Signed)
Family Meeting Note  Advance Directive:no  Today a meeting took place with the Patient.  The following clinical team members were present during this meeting:MD  The following were discussed:Patient's diagnosis: s/p liver transplant, A fib, hip fracture now , Patient's progosis: Unable to determine and Goals for treatment: Full Code  I had a discussion with pt, encouraged him to do living will and discuss his wishes about CPR, Defribrillator and ventilatory support with his family, he is motivated and for now want to keep it full code.  Additional follow-up to be provided: Orthopedic  Time spent during discussion:20 minutes  Vaughan Basta, MD

## 2017-11-29 NOTE — Progress Notes (Signed)
[facility] [place] NAME: Hector Mueller    MR#:  [id number]  DATE OF BIRTH:  June 18, 1950  SUBJECTIVE:  CHIEF COMPLAINT:   Chief Complaint  Patient presents with  . Fall  . Hip Pain    hx of A fib, Hpe c - s/p liver transplant- had accidental fall and have Hip fracture. Awaiting surgery.  REVIEW OF SYSTEMS:  CONSTITUTIONAL: No fever, fatigue or weakness.  EYES: No blurred or double vision.  EARS, NOSE, AND THROAT: No tinnitus or ear pain.  RESPIRATORY: No cough, shortness of breath, wheezing or hemoptysis.  CARDIOVASCULAR: No chest pain, orthopnea, edema.  GASTROINTESTINAL: No nausea, vomiting, diarrhea or abdominal pain.  GENITOURINARY: No dysuria, hematuria.  ENDOCRINE: No polyuria, nocturia,  HEMATOLOGY: No anemia, easy bruising or bleeding SKIN: No rash or lesion. MUSCULOSKELETAL: right hip pain.   NEUROLOGIC: No tingling, numbness, weakness.  PSYCHIATRY: No anxiety or depression.   ROS  DRUG ALLERGIES:  No Known Allergies  VITALS:  Blood pressure 122/64, pulse 74, temperature 97.7 F (36.5 C), temperature source Oral, resp. rate 16, height 5\' 9"  (1.753 m), weight 89.8 kg, SpO2 100 %.  PHYSICAL EXAMINATION:  GENERAL:  67 y.o.-year-old patient lying in the bed with no acute distress.  EYES: Pupils equal, round, reactive to light and accommodation. No scleral icterus. Extraocular muscles intact.  HEENT: Head atraumatic, normocephalic. Oropharynx and nasopharynx clear.  NECK:  Supple, no jugular venous distention. No thyroid enlargement, no tenderness.  LUNGS: Normal breath sounds bilaterally, no wheezing, rales,rhonchi or crepitation. No use of accessory muscles of respiration.  CARDIOVASCULAR: S1, S2 normal. No murmurs, rubs, or gallops.  ABDOMEN: Soft, nontender, nondistended. Bowel sounds present. No organomegaly or mass.  EXTREMITIES: No pedal edema, cyanosis, or clubbing. Right LL in traction. NEUROLOGIC: Cranial nerves  II through XII are intact. Muscle strength 5/5 in all extremities. Sensation intact. Gait not checked.  PSYCHIATRIC: The patient is alert and oriented x 3.  SKIN: No obvious rash, lesion, or ulcer.   Physical Exam LABORATORY PANEL:   CBC Recent Labs  Lab 11/29/17 0556  WBC 5.1  HGB 8.3*  HCT 24.1*  PLT 64*   ------------------------------------------------------------------------------------------------------------------  Chemistries  Recent Labs  Lab 11/27/17 2053  11/29/17 0556  NA 137   < > 135  K 4.7   < > 4.7  CL 109   < > 109  CO2 18*   < > 20*  GLUCOSE 165*   < > 151*  BUN 44*   < > 37*  CREATININE 1.81*   < > 1.80*  CALCIUM 9.5   < > 8.5*  AST 15  --   --   ALT 11  --   --   ALKPHOS 90  --   --   BILITOT 0.7  --   --    < > = values in this interval not displayed.   ------------------------------------------------------------------------------------------------------------------  Cardiac Enzymes No results for input(s): TROPONINI in the last 168 hours. ------------------------------------------------------------------------------------------------------------------  RADIOLOGY:  Dg Chest 1 View  Result Date: 11/27/2017 CLINICAL DATA:  Fall with hip fracture EXAM: CHEST  1 VIEW COMPARISON:  01/26/2015 FINDINGS: Partially visualized lower thoracic rods. No acute airspace disease or effusion. Normal cardiomediastinal silhouette. Aortic atherosclerosis. No pneumothorax. IMPRESSION: No active disease. Electronically Signed   By: Donavan Foil M.D.   On: 11/27/2017 20:47   Ct Head Wo Contrast  Result Date: 11/27/2017 CLINICAL DATA:  Tripped and fell, hitting head. EXAM: CT  HEAD WITHOUT CONTRAST CT CERVICAL SPINE WITHOUT CONTRAST TECHNIQUE: Multidetector CT imaging of the head and cervical spine was performed following the standard protocol without intravenous contrast. Multiplanar CT image reconstructions of the cervical spine were also generated. COMPARISON:  None.  FINDINGS: CT HEAD FINDINGS Brain: No evidence for acute infarction, hemorrhage, mass lesion, hydrocephalus, or extra-axial fluid. Mild atrophy. Hypoattenuation of white matter, likely small vessel disease. Chronic appearing RIGHT thalamic lacunar infarct. Vascular: Calcification of the cavernous internal carotid arteries consistent with cerebrovascular atherosclerotic disease. No signs of intracranial large vessel occlusion. Skull: Normal. Negative for fracture or focal lesion. Sinuses/Orbits: No acute finding. Other: Large RIGHT-sided scalp hematoma over the temporalis muscle region. CT CERVICAL SPINE FINDINGS Alignment: 1-2 mm of facet mediated retrolisthesis at C4-5 and 1-2 mm of facet mediated anterolisthesis at C7-T1, otherwise anatomic. No traumatic subluxation. Skull base and vertebrae: No acute fracture. No primary bone lesion or focal pathologic process. Soft tissues and spinal canal: No prevertebral fluid or swelling. No visible canal hematoma. Disc levels: Multilevel spondylosis, with advanced disc space narrowing, most pronounced at C6-C7. Upper chest: Unremarkable. Other: None. IMPRESSION: No skull fracture or intracranial hemorrhage. Large RIGHT-sided scalp hematoma. Multilevel spondylosis. No cervical spine fracture or traumatic subluxation. Electronically Signed   By: Staci Righter M.D.   On: 11/27/2017 21:28   Ct Cervical Spine Wo Contrast  Result Date: 11/27/2017 CLINICAL DATA:  Tripped and fell, hitting head. EXAM: CT HEAD WITHOUT CONTRAST CT CERVICAL SPINE WITHOUT CONTRAST TECHNIQUE: Multidetector CT imaging of the head and cervical spine was performed following the standard protocol without intravenous contrast. Multiplanar CT image reconstructions of the cervical spine were also generated. COMPARISON:  None. FINDINGS: CT HEAD FINDINGS Brain: No evidence for acute infarction, hemorrhage, mass lesion, hydrocephalus, or extra-axial fluid. Mild atrophy. Hypoattenuation of white matter, likely  small vessel disease. Chronic appearing RIGHT thalamic lacunar infarct. Vascular: Calcification of the cavernous internal carotid arteries consistent with cerebrovascular atherosclerotic disease. No signs of intracranial large vessel occlusion. Skull: Normal. Negative for fracture or focal lesion. Sinuses/Orbits: No acute finding. Other: Large RIGHT-sided scalp hematoma over the temporalis muscle region. CT CERVICAL SPINE FINDINGS Alignment: 1-2 mm of facet mediated retrolisthesis at C4-5 and 1-2 mm of facet mediated anterolisthesis at C7-T1, otherwise anatomic. No traumatic subluxation. Skull base and vertebrae: No acute fracture. No primary bone lesion or focal pathologic process. Soft tissues and spinal canal: No prevertebral fluid or swelling. No visible canal hematoma. Disc levels: Multilevel spondylosis, with advanced disc space narrowing, most pronounced at C6-C7. Upper chest: Unremarkable. Other: None. IMPRESSION: No skull fracture or intracranial hemorrhage. Large RIGHT-sided scalp hematoma. Multilevel spondylosis. No cervical spine fracture or traumatic subluxation. Electronically Signed   By: Staci Righter M.D.   On: 11/27/2017 21:28   Dg Hip Operative Unilat W Or W/o Pelvis Right  Result Date: 11/28/2017 CLINICAL DATA:  Status post ORIF for and acute intertrochanteric fracture of the right hip EXAM: OPERATIVE right HIP (WITH PELVIS IF PERFORMED) 3 VIEWS TECHNIQUE: Fluoroscopic spot image(s) were submitted for interpretation post-operatively. COMPARISON:  Right hip series of November 27, 2017 FINDINGS: The patient has undergone placement of an intramedullary rod and telescoping nail for reduction and fixation of the intertrochanteric fracture. Radiographic positioning of the appliances is good. IMPRESSION: No immediate complication following ORIF for an intertrochanteric fracture of the right hip. Electronically Signed   By: David  Martinique M.D.   On: 11/28/2017 14:01   Dg Hip Unilat W Or Wo Pelvis  2-3 Views Right  Result  Date: 11/27/2017 CLINICAL DATA:  Right hip pain after fall EXAM: DG HIP (WITH OR WITHOUT PELVIS) 2-3V RIGHT COMPARISON:  None. FINDINGS: Limited by positioning. Surgical hardware in the lumbosacral spine. Pubic symphysis appears intact. Both femoral heads project in joint. Acute slightly displaced right intertrochanteric fracture. IMPRESSION: Acute displaced right intertrochanteric fracture Electronically Signed   By: Donavan Foil M.D.   On: 11/27/2017 20:46    ASSESSMENT AND PLAN:   Active Problems:   Hip fracture ([place])  1.  Acute displaced right intertrochanteric fracture, status post mechanical fall.    Plan for surgery today  keep patient n.p.o.  And avoid anticoagulants. Continue pain control.  2.  Liver failure, status post recent liver transplant, last year.  Patient is doing well, liver function tests are within normal limits.  Will continue medications per transplant team and monitor liver function closely.  3.  Elevated BUN and creatinine level, likely secondary to CellCept use.  We will start IV fluids to help with a possible prerenal component.  We will continue to monitor kidney function closely and avoid nephrotoxic medications.  4.  Paroxysmal atrial fibrillation, currently in sinus rhythm, rate controlled.  Continue to monitor on telemetry.    Coumadin was stopped a few months ago by his primary team- likely due to thrombocytopenia, monitor.  5.  Diabetes type 2.  Will monitor blood sugars before meals and at bedtime and use insulin treatment during the hospital stay.   All the records are reviewed and case discussed with Care Management/Social Workerr. Management plans discussed with the patient, family and they are in agreement.  CODE STATUS: full.  TOTAL TIME TAKING CARE OF THIS PATIENT: 35 minutes.   POSSIBLE D/C IN 2-3 DAYS, DEPENDING ON CLINICAL CONDITION.   Vaughan Basta M.D on 11/29/2017   Between 7am to 6pm - Pager -  [phone]  After 6pm go to [email or web address] - password [email or web address] [facility]  Office  [phone]  CC: Primary care physician; [name], MD  Note: This dictation was prepared with Dragon dictation along with smaller phrase technology. Any transcriptional errors that result from this process are unintentional.

## 2017-11-29 NOTE — Progress Notes (Signed)
[facility] [place] NAME: Hector Mueller    MR#:  [id number]  DATE OF BIRTH:  1950-08-12  SUBJECTIVE:  CHIEF COMPLAINT:   Chief Complaint  Patient presents with  . Fall  . Hip Pain    hx of A fib, Hpe c - s/p liver transplant- had accidental fall and have Hip fracture. Awaiting surgery.  REVIEW OF SYSTEMS:  CONSTITUTIONAL: No fever, fatigue or weakness.  EYES: No blurred or double vision.  EARS, NOSE, AND THROAT: No tinnitus or ear pain.  RESPIRATORY: No cough, shortness of breath, wheezing or hemoptysis.  CARDIOVASCULAR: No chest pain, orthopnea, edema.  GASTROINTESTINAL: No nausea, vomiting, diarrhea or abdominal pain.  GENITOURINARY: No dysuria, hematuria.  ENDOCRINE: No polyuria, nocturia,  HEMATOLOGY: No anemia, easy bruising or bleeding SKIN: No rash or lesion. MUSCULOSKELETAL: right hip pain.   NEUROLOGIC: No tingling, numbness, weakness.  PSYCHIATRY: No anxiety or depression.   ROS  DRUG ALLERGIES:  No Known Allergies  VITALS:  Blood pressure 122/64, pulse 74, temperature 97.7 F (36.5 C), temperature source Oral, resp. rate 16, height 5\' 9"  (1.753 m), weight 89.8 kg, SpO2 100 %.  PHYSICAL EXAMINATION:  GENERAL:  67 y.o.-year-old patient lying in the bed with no acute distress.  EYES: Pupils equal, round, reactive to light and accommodation. No scleral icterus. Extraocular muscles intact.  HEENT: Head atraumatic, normocephalic. Oropharynx and nasopharynx clear.  NECK:  Supple, no jugular venous distention. No thyroid enlargement, no tenderness.  LUNGS: Normal breath sounds bilaterally, no wheezing, rales,rhonchi or crepitation. No use of accessory muscles of respiration.  CARDIOVASCULAR: S1, S2 normal. No murmurs, rubs, or gallops.  ABDOMEN: Soft, nontender, nondistended. Bowel sounds present. No organomegaly or mass.  EXTREMITIES: No pedal edema, cyanosis, or clubbing. Post surgical dressing right thigh. NEUROLOGIC:  Cranial nerves II through XII are intact. Muscle strength 5/5 in all extremities. Sensation intact. Gait not checked.  PSYCHIATRIC: The patient is alert and oriented x 3.  SKIN: No obvious rash, lesion, or ulcer.   Physical Exam LABORATORY PANEL:   CBC Recent Labs  Lab 11/29/17 0556  WBC 5.1  HGB 8.3*  HCT 24.1*  PLT 64*   ------------------------------------------------------------------------------------------------------------------  Chemistries  Recent Labs  Lab 11/27/17 2053  11/29/17 0556  NA 137   < > 135  K 4.7   < > 4.7  CL 109   < > 109  CO2 18*   < > 20*  GLUCOSE 165*   < > 151*  BUN 44*   < > 37*  CREATININE 1.81*   < > 1.80*  CALCIUM 9.5   < > 8.5*  AST 15  --   --   ALT 11  --   --   ALKPHOS 90  --   --   BILITOT 0.7  --   --    < > = values in this interval not displayed.   ------------------------------------------------------------------------------------------------------------------  Cardiac Enzymes No results for input(s): TROPONINI in the last 168 hours. ------------------------------------------------------------------------------------------------------------------  RADIOLOGY:  Dg Chest 1 View  Result Date: 11/27/2017 CLINICAL DATA:  Fall with hip fracture EXAM: CHEST  1 VIEW COMPARISON:  01/26/2015 FINDINGS: Partially visualized lower thoracic rods. No acute airspace disease or effusion. Normal cardiomediastinal silhouette. Aortic atherosclerosis. No pneumothorax. IMPRESSION: No active disease. Electronically Signed   By: Donavan Foil M.D.   On: 11/27/2017 20:47   Ct Head Wo Contrast  Result Date: 11/27/2017 CLINICAL DATA:  Tripped and fell, hitting head. EXAM:  CT HEAD WITHOUT CONTRAST CT CERVICAL SPINE WITHOUT CONTRAST TECHNIQUE: Multidetector CT imaging of the head and cervical spine was performed following the standard protocol without intravenous contrast. Multiplanar CT image reconstructions of the cervical spine were also generated.  COMPARISON:  None. FINDINGS: CT HEAD FINDINGS Brain: No evidence for acute infarction, hemorrhage, mass lesion, hydrocephalus, or extra-axial fluid. Mild atrophy. Hypoattenuation of white matter, likely small vessel disease. Chronic appearing RIGHT thalamic lacunar infarct. Vascular: Calcification of the cavernous internal carotid arteries consistent with cerebrovascular atherosclerotic disease. No signs of intracranial large vessel occlusion. Skull: Normal. Negative for fracture or focal lesion. Sinuses/Orbits: No acute finding. Other: Large RIGHT-sided scalp hematoma over the temporalis muscle region. CT CERVICAL SPINE FINDINGS Alignment: 1-2 mm of facet mediated retrolisthesis at C4-5 and 1-2 mm of facet mediated anterolisthesis at C7-T1, otherwise anatomic. No traumatic subluxation. Skull base and vertebrae: No acute fracture. No primary bone lesion or focal pathologic process. Soft tissues and spinal canal: No prevertebral fluid or swelling. No visible canal hematoma. Disc levels: Multilevel spondylosis, with advanced disc space narrowing, most pronounced at C6-C7. Upper chest: Unremarkable. Other: None. IMPRESSION: No skull fracture or intracranial hemorrhage. Large RIGHT-sided scalp hematoma. Multilevel spondylosis. No cervical spine fracture or traumatic subluxation. Electronically Signed   By: Staci Righter M.D.   On: 11/27/2017 21:28   Ct Cervical Spine Wo Contrast  Result Date: 11/27/2017 CLINICAL DATA:  Tripped and fell, hitting head. EXAM: CT HEAD WITHOUT CONTRAST CT CERVICAL SPINE WITHOUT CONTRAST TECHNIQUE: Multidetector CT imaging of the head and cervical spine was performed following the standard protocol without intravenous contrast. Multiplanar CT image reconstructions of the cervical spine were also generated. COMPARISON:  None. FINDINGS: CT HEAD FINDINGS Brain: No evidence for acute infarction, hemorrhage, mass lesion, hydrocephalus, or extra-axial fluid. Mild atrophy. Hypoattenuation of  white matter, likely small vessel disease. Chronic appearing RIGHT thalamic lacunar infarct. Vascular: Calcification of the cavernous internal carotid arteries consistent with cerebrovascular atherosclerotic disease. No signs of intracranial large vessel occlusion. Skull: Normal. Negative for fracture or focal lesion. Sinuses/Orbits: No acute finding. Other: Large RIGHT-sided scalp hematoma over the temporalis muscle region. CT CERVICAL SPINE FINDINGS Alignment: 1-2 mm of facet mediated retrolisthesis at C4-5 and 1-2 mm of facet mediated anterolisthesis at C7-T1, otherwise anatomic. No traumatic subluxation. Skull base and vertebrae: No acute fracture. No primary bone lesion or focal pathologic process. Soft tissues and spinal canal: No prevertebral fluid or swelling. No visible canal hematoma. Disc levels: Multilevel spondylosis, with advanced disc space narrowing, most pronounced at C6-C7. Upper chest: Unremarkable. Other: None. IMPRESSION: No skull fracture or intracranial hemorrhage. Large RIGHT-sided scalp hematoma. Multilevel spondylosis. No cervical spine fracture or traumatic subluxation. Electronically Signed   By: Staci Righter M.D.   On: 11/27/2017 21:28   Dg Hip Operative Unilat W Or W/o Pelvis Right  Result Date: 11/28/2017 CLINICAL DATA:  Status post ORIF for and acute intertrochanteric fracture of the right hip EXAM: OPERATIVE right HIP (WITH PELVIS IF PERFORMED) 3 VIEWS TECHNIQUE: Fluoroscopic spot image(s) were submitted for interpretation post-operatively. COMPARISON:  Right hip series of November 27, 2017 FINDINGS: The patient has undergone placement of an intramedullary rod and telescoping nail for reduction and fixation of the intertrochanteric fracture. Radiographic positioning of the appliances is good. IMPRESSION: No immediate complication following ORIF for an intertrochanteric fracture of the right hip. Electronically Signed   By: David  Martinique M.D.   On: 11/28/2017 14:01   Dg Hip  Unilat W Or Wo Pelvis 2-3 Views Right  Result Date: 11/27/2017 CLINICAL DATA:  Right hip pain after fall EXAM: DG HIP (WITH OR WITHOUT PELVIS) 2-3V RIGHT COMPARISON:  None. FINDINGS: Limited by positioning. Surgical hardware in the lumbosacral spine. Pubic symphysis appears intact. Both femoral heads project in joint. Acute slightly displaced right intertrochanteric fracture. IMPRESSION: Acute displaced right intertrochanteric fracture Electronically Signed   By: Donavan Foil M.D.   On: 11/27/2017 20:46    ASSESSMENT AND PLAN:   Active Problems:   Hip fracture ([place])  1.  Acute displaced right intertrochanteric fracture, status post mechanical fall.   S/p Intramedullary nail- 11/28/17 Continue pain control.  Due to low platelets, I will discuss about anticoagulants with ortho.  2.  Liver failure, status post recent liver transplant, last year.  Patient is doing well, liver function tests are within normal limits.  Will continue medications per transplant team and monitor liver function closely.  3.  Elevated BUN and creatinine level, likely secondary to CellCept use.  We will start IV fluids to help with a possible prerenal component.  We will continue to monitor kidney function closely and avoid nephrotoxic medications.  Stable.  4.  Paroxysmal atrial fibrillation, currently in sinus rhythm, rate controlled.  Continue to monitor on telemetry.    Coumadin was stopped a few months ago by his primary team- likely due to thrombocytopenia, monitor.  5.  Diabetes type 2.  Will monitor blood sugars before meals and at bedtime and use insulin treatment during the hospital stay.   All the records are reviewed and case discussed with Care Management/Social Workerr. Management plans discussed with the patient, family and they are in agreement.  CODE STATUS: full.  TOTAL TIME TAKING CARE OF THIS PATIENT: 35 minutes.   POSSIBLE D/C IN 2-3 DAYS, DEPENDING ON CLINICAL  CONDITION.   Vaughan Basta M.D on 11/29/2017   Between 7am to 6pm - Pager - [phone]  After 6pm go to [email or web address] - password [email or web address] [facility]  Office  [phone]  CC: Primary care physician; [name], MD  Note: This dictation was prepared with Dragon dictation along with smaller phrase technology. Any transcriptional errors that result from this process are unintentional.

## 2017-11-30 LAB — GLUCOSE, CAPILLARY
GLUCOSE-CAPILLARY: 165 mg/dL — AB (ref 70–99)
GLUCOSE-CAPILLARY: 151 mg/dL — AB (ref 70–99)
Glucose-Capillary: 153 mg/dL — ABNORMAL HIGH (ref 70–99)
Glucose-Capillary: 139 mg/dL — ABNORMAL HIGH (ref 70–99)

## 2017-11-30 LAB — BASIC METABOLIC PANEL
Anion gap: 6 (ref 5–15)
BUN: 33 mg/dL — ABNORMAL HIGH (ref 8–23)
CO2: 22 mmol/L (ref 22–32)
Calcium: 8.7 mg/dL — ABNORMAL LOW (ref 8.9–10.3)
Chloride: 106 mmol/L (ref 98–111)
Creatinine, Ser: 1.62 mg/dL — ABNORMAL HIGH (ref 0.61–1.24)
GFR calc Af Amer: 48 mL/min — ABNORMAL LOW (ref >60)
GFR calc non Af Amer: 42 mL/min — ABNORMAL LOW (ref >60)
Glucose, Bld: 148 mg/dL — ABNORMAL HIGH (ref 70–99)
Potassium: 4.6 mmol/L (ref 3.5–5.1)
Sodium: 134 mmol/L — ABNORMAL LOW (ref 135–145)

## 2017-11-30 LAB — HEMOGLOBIN AND HEMATOCRIT, BLOOD
HCT: 26.9 % — ABNORMAL LOW (ref 40.0–52.0)
Hemoglobin: 9.3 g/dL — ABNORMAL LOW (ref 13.0–18.0)

## 2017-11-30 LAB — CBC
HEMATOCRIT: 22.4 % — AB (ref 40.0–52.0)
HEMOGLOBIN: 7.7 g/dL — AB (ref 13.0–18.0)
MCH: 31.3 pg (ref 26.0–34.0)
MCHC: 34.4 g/dL (ref 32.0–36.0)
MCV: 90.8 fL (ref 80.0–100.0)
PLATELETS: 54 10*3/uL — AB (ref 150–440)
RBC: 2.46 MIL/uL — ABNORMAL LOW (ref 4.40–5.90)
RDW: 13.6 % (ref 11.5–14.5)
WBC: 3.9 10*3/uL (ref 3.8–10.6)

## 2017-11-30 LAB — PREPARE RBC (CROSSMATCH)

## 2017-11-30 LAB — ABO/RH: ABO/RH(D): O NEG

## 2017-11-30 MED ORDER — CALCIUM CARBONATE ANTACID 500 MG PO CHEW
2.0000 | CHEWABLE_TABLET | Freq: Every day | ORAL | Status: DC | PRN
  Administered 2017-11-30: 400 mg via ORAL
  Filled 2017-11-30: qty 2

## 2017-11-30 MED ORDER — ASPIRIN 81 MG PO CHEW
81.0000 mg | CHEWABLE_TABLET | Freq: Every day | ORAL | Status: DC
  Administered 2017-11-30 – 2017-12-01 (×2): 81 mg via ORAL
  Filled 2017-11-30 (×2): qty 1

## 2017-11-30 MED ORDER — SODIUM CHLORIDE 0.9% IV SOLUTION
Freq: Once | INTRAVENOUS | Status: AC
  Administered 2017-11-30: 14:00:00 via INTRAVENOUS

## 2017-11-30 NOTE — Progress Notes (Signed)
Subjective: 2 Days Post-Op Procedure(s) (LRB): INTRAMEDULLARY (IM) NAIL INTERTROCHANTRIC (Right)    Patient reports pain as mild.  Objective:   VITALS:   Vitals:   11/29/17 1600 11/29/17 2339  BP: 123/65 123/68  Pulse: 80 83  Resp: 16 15  Temp: 97.6 F (36.4 C) 98.9 F (37.2 C)  SpO2: 100% 97%    Neurologically intact ABD soft Neurovascular intact Sensation intact distally Intact pulses distally Dorsiflexion/Plantar flexion intact Incision: scant drainage  LABS Recent Labs    11/27/17 2053 11/28/17 0352 11/29/17 0556  HGB 11.9* 10.1* 8.3*  HCT 35.3* 29.3* 24.1*  WBC 5.0 4.7 5.1  PLT 81* 74* 64*    Recent Labs    11/27/17 2053 11/28/17 0352 11/29/17 0556  NA 137 137 135  K 4.7 5.0 4.7  BUN 44* 43* 37*  CREATININE 1.81* 1.65* 1.80*  GLUCOSE 165* 232* 151*    Recent Labs    11/27/17 2053  INR 1.01     Assessment/Plan: 2 Days Post-Op Procedure(s) (LRB): INTRAMEDULLARY (IM) NAIL INTERTROCHANTRIC (Right)   Advance diet Up with therapy D/C IV fluids Discharge to SNF

## 2017-11-30 NOTE — Progress Notes (Signed)
Physical Therapy Treatment Patient Details Name: Hector Mueller MRN: [id number] DOB: 1950/06/08 Today's Date: 11/30/2017    History of Present Illness Patient is a 67 year old male admitted s/p IM nailing of R hip following a fall at home.  PMH includes Hep C, cirrhosis, CA, BPH and atrial fibrillation.    PT Comments    Pt is received in bed currently receiving blood transfusion. Pt is pleasant and ready to participate in PT. Ther-ex performed supine in bed minimal VC for technique and supervision-mod assist needed for R LE due to weakness of hip musculature. Pain with movement appears to be slightly decreased from this morning. Will continue to progress strength, balance, and mobility tomorrow as able.  Follow Up Recommendations  SNF     Equipment Recommendations  None recommended by PT    Recommendations for Other Services       Precautions / Restrictions Precautions Precautions: Fall Precaution Comments: High Fall risk Restrictions Weight Bearing Restrictions: Yes RLE Weight Bearing: Partial weight bearing    Mobility  Bed Mobility     General bed mobility comments: Pt receiving blood transfusion on arrival, will progress mobility next tx.  Transfers         Ambulation/Gait          Stairs             Wheelchair Mobility    Modified Rankin (Stroke Patients Only)          Cognition Arousal/Alertness: Awake/alert Behavior During Therapy: WFL for tasks assessed/performed Overall Cognitive Status: Within Functional Limits for tasks assessed                                 General Comments: Follows commands consistently.      Exercises Other Exercises Other Exercises: Supine BLE ankle pumps, glute sets, quad sets, SAQ, hip ABD/ADD, SLRs, and heel slides. All ther-ex performed 15 x reps with supervision and VC for techniques, except R LE SAQ, hip ABD/ADD, SLRs, and mini heel slides with mod assist.    General Comments         Pertinent Vitals/Pain Pain Assessment: 0-10 Pain Score: 8  Pain Location: R hip with mobility Pain Descriptors / Indicators: Grimacing;Guarding Pain Intervention(s): Limited activity within patient's tolerance;Monitored during session    Home Living                      Prior Function            PT Goals (current goals can now be found in the care plan section) Acute Rehab PT Goals Patient Stated Goal: To return home as soon as possible. PT Goal Formulation: With patient/family Time For Goal Achievement: 12/13/17 Potential to Achieve Goals: Good Progress towards PT goals: Progressing toward goals    Frequency    BID      PT Plan Current plan remains appropriate    Co-evaluation              AM-PAC PT "6 Clicks" Daily Activity  Outcome Measure  Difficulty turning over in bed (including adjusting bedclothes, sheets and blankets)?: Unable Difficulty moving from lying on back to sitting on the side of the bed? : Unable Difficulty sitting down on and standing up from a chair with arms (e.g., wheelchair, bedside commode, etc,.)?: Unable Help needed moving to and from a bed to chair (including a wheelchair)?: A Lot Help needed walking in  hospital room?: A Lot Help needed climbing 3-5 steps with a railing? : Total 6 Click Score: 8    End of Session Equipment Utilized During Treatment: Gait belt Activity Tolerance: Patient tolerated treatment well Patient left: in bed;with call bell/phone within reach;with bed alarm set;with SCD's reapplied Nurse Communication: Mobility status PT Visit Diagnosis: Unsteadiness on feet (R26.81);Muscle weakness (generalized) (M62.81);Pain Pain - Right/Left: Right Pain - part of body: Hip     Time: 9996-7227 PT Time Calculation (min) (ACUTE ONLY): 18 min  Charges:  $Gait Training: 8-22 mins $Therapeutic Exercise: 8-22 mins                     Algis Downs, SPT   Algis Downs 11/30/2017, 2:56 PM

## 2017-11-30 NOTE — Progress Notes (Signed)
Physical Therapy Treatment Patient Details Name: Hector Mueller MRN: [id number] DOB: 16-Jun-1950 Today's Date: 11/30/2017    History of Present Illness Patient is a 67 year old male admitted s/p IM nailing of R hip following a fall at home.  PMH includes Hep C, cirrhosis, CA, BPH and atrial fibrillation.    PT Comments    Pt is agreeable to PT on arrival. He performed supine ther-ex with minimal VC for technique and required supervision to Mod A due to pain and weakness of hip musculature. Pt has progressed transfer to sit-to-stand performs with RW and mod assist, 2+ for safety, and amb very short distance to chair. Pt is limited by pain. Will continue to progress strength, balance, and mobility as able.   Follow Up Recommendations  SNF     Equipment Recommendations  None recommended by PT    Recommendations for Other Services       Precautions / Restrictions Precautions Precautions: Fall Precaution Comments: High Fall risk Restrictions Weight Bearing Restrictions: Yes RLE Weight Bearing: Partial weight bearing    Mobility  Bed Mobility Overal bed mobility: Needs Assistance Bed Mobility: Supine to Sit     Supine to sit: Max assist;+2 for physical assistance     General bed mobility comments: Pt assisted pt in bringing LE's over EOB and VCs body mechanics to scoot up in bed. Pt able to scoot with min assist to manage RLE due to pain.  Transfers Overall transfer level: Needs assistance Equipment used: Rolling walker (2 wheeled) Transfers: Sit to/from Stand Sit to Stand: +2 physical assistance;Mod assist;From elevated surface         General transfer comment: Pt able to rise from elevated bedside with mod A, 2+ assist for safety. PT provided VC for sequencing with RW. Pt maintained PWB statues t/o.  Ambulation/Gait Ambulation/Gait assistance: Mod assist;+2 safety/equipment Gait Distance (Feet): 2 Feet Assistive device: Rolling walker (2 wheeled)       General  Gait Details: Pt amb with dec step length and foot clearance. He uses RW for UE support and maintaining PWB status. Requires increased time and effort to perform.   Stairs             Wheelchair Mobility    Modified Rankin (Stroke Patients Only)       Balance Overall balance assessment: Needs assistance Sitting-balance support: Feet supported;Single extremity supported Sitting balance-Leahy Scale: Fair Sitting balance - Comments: Pt has good trunk control and is able to maintain posture without physical assist.   Standing balance support: Bilateral upper extremity supported Standing balance-Leahy Scale: Poor Standing balance comment: Pt requires UE support to maintain balance and PWB status in standing.                            Cognition Arousal/Alertness: Awake/alert Behavior During Therapy: WFL for tasks assessed/performed Overall Cognitive Status: Within Functional Limits for tasks assessed                                 General Comments: Follows commands consistently.      Exercises Other Exercises Other Exercises: Supine ankle pumps, glute sets, quad sets, SAQ, hip ABD/ADD, and SLRs. All ther-ex performed 12 x reps with mod assist and VC for technique. Except ankle pumps, glute sets, and quad sets which were performed with only supervision.     General Comments  Pertinent Vitals/Pain Pain Assessment: 0-10 Pain Score: 8  Pain Location: R hip with mobility Pain Descriptors / Indicators: Grimacing;Guarding;Sharp Pain Intervention(s): Limited activity within patient's tolerance;Monitored during session    Home Living                      Prior Function            PT Goals (current goals can now be found in the care plan section) Acute Rehab PT Goals Patient Stated Goal: To return home as soon as possible. PT Goal Formulation: With patient/family Time For Goal Achievement: 12/13/17 Potential to Achieve Goals:  Good Progress towards PT goals: Progressing toward goals    Frequency    BID      PT Plan Current plan remains appropriate    Co-evaluation              AM-PAC PT "6 Clicks" Daily Activity  Outcome Measure  Difficulty turning over in bed (including adjusting bedclothes, sheets and blankets)?: Unable Difficulty moving from lying on back to sitting on the side of the bed? : Unable Difficulty sitting down on and standing up from a chair with arms (e.g., wheelchair, bedside commode, etc,.)?: Unable Help needed moving to and from a bed to chair (including a wheelchair)?: A Lot Help needed walking in hospital room?: A Lot Help needed climbing 3-5 steps with a railing? : Total 6 Click Score: 8    End of Session Equipment Utilized During Treatment: Gait belt Activity Tolerance: Patient limited by pain Patient left: in chair;with call bell/phone within reach;with chair alarm set Nurse Communication: Mobility status PT Visit Diagnosis: Unsteadiness on feet (R26.81);Muscle weakness (generalized) (M62.81);Pain Pain - Right/Left: Right Pain - part of body: Hip     Time: 1010-1037 PT Time Calculation (min) (ACUTE ONLY): 27 min  Charges:  $Gait Training: 8-22 mins $Therapeutic Exercise: 8-22 mins                     Algis Downs, SPT    Algis Downs 11/30/2017, 12:10 PM

## 2017-11-30 NOTE — Progress Notes (Addendum)
Subjective: 2 Days Post-Op Procedure(s) (LRB): INTRAMEDULLARY (IM) NAIL INTERTROCHANTRIC (Right)  Doing well but hgb low.  Transfuse 1 unit today.    Patient reports pain as mild.  Objective:   VITALS:   Vitals:   11/30/17 1127 11/30/17 1156  BP: 125/63 (!) 126/56  Pulse: 76 79  Resp: 16 18  Temp: 98.5 F (36.9 C) 99 F (37.2 C)  SpO2: 100% 100%    Neurologically intact ABD soft Neurovascular intact Sensation intact distally Intact pulses distally Dorsiflexion/Plantar flexion intact Incision: scant drainage  LABS Recent Labs    11/28/17 0352 11/29/17 0556 11/30/17 0322  HGB 10.1* 8.3* 7.7*  HCT 29.3* 24.1* 22.4*  WBC 4.7 5.1 3.9  PLT 74* 64* 54*    Recent Labs    11/28/17 0352 11/29/17 0556 11/30/17 0322  NA 137 135 134*  K 5.0 4.7 4.6  BUN 43* 37* 33*  CREATININE 1.65* 1.80* 1.62*  GLUCOSE 232* 151* 148*    Recent Labs    11/27/17 2053  INR 1.01     Assessment/Plan  D/C lovenox today due to low platelets and dropping hgb.   Suggest other form of DVT prevention taking low platelet count into consideration.  Advance diet Up with therapy D/C IV fluids Discharge to SNF   RTC 2 weeks  EC  ASA 325mg   Bid  For 6 weeks

## 2017-11-30 NOTE — Progress Notes (Signed)
Mueller Mueller at [facility] NAME: Mueller Mueller    MR#:  [id number]  DATE OF BIRTH:  1950-09-08  SUBJECTIVE:  CHIEF COMPLAINT:   Chief Complaint  Patient presents with  . Fall  . Hip Pain    hx of A fib, Hpe c - s/p liver transplant- had accidental fall and have Hip fracture. S/p surgery- have drop in Hb. Transfusion today.  REVIEW OF SYSTEMS:  CONSTITUTIONAL: No fever, fatigue or weakness.  EYES: No blurred or double vision.  EARS, NOSE, AND THROAT: No tinnitus or ear pain.  RESPIRATORY: No cough, shortness of breath, wheezing or hemoptysis.  CARDIOVASCULAR: No chest pain, orthopnea, edema.  GASTROINTESTINAL: No nausea, vomiting, diarrhea or abdominal pain.  GENITOURINARY: No dysuria, hematuria.  ENDOCRINE: No polyuria, nocturia,  HEMATOLOGY: No anemia, easy bruising or bleeding SKIN: No rash or lesion. MUSCULOSKELETAL: right hip pain.   NEUROLOGIC: No tingling, numbness, weakness.  PSYCHIATRY: No anxiety or depression.   ROS  DRUG ALLERGIES:  No Known Allergies  VITALS:  Blood pressure 137/75, pulse 83, temperature 98 F (36.7 C), temperature source Oral, resp. rate 17, height 5\' 9"  (1.753 m), weight 89.8 kg, SpO2 100 %.  PHYSICAL EXAMINATION:  GENERAL:  67 y.o.-year-old patient lying in the bed with no acute distress.  EYES: Pupils equal, round, reactive to light and accommodation. No scleral icterus. Extraocular muscles intact.  HEENT: Head atraumatic, normocephalic. Oropharynx and nasopharynx clear.  NECK:  Supple, no jugular venous distention. No thyroid enlargement, no tenderness.  LUNGS: Normal breath sounds bilaterally, no wheezing, rales,rhonchi or crepitation. No use of accessory muscles of respiration.  CARDIOVASCULAR: S1, S2 normal. No murmurs, rubs, or gallops.  ABDOMEN: Soft, nontender, nondistended. Bowel sounds present. No organomegaly or mass.  EXTREMITIES: No pedal edema, cyanosis, or clubbing. Post surgical dressing  right thigh. NEUROLOGIC: Cranial nerves II through XII are intact. Muscle strength 4/5 in all extremities except right lower due to surgery. Sensation intact. Gait not checked.  PSYCHIATRIC: The patient is alert and oriented x 3.  SKIN: No obvious rash, lesion, or ulcer.   Physical Exam LABORATORY PANEL:   CBC Recent Labs  Lab 11/30/17 0322  WBC 3.9  HGB 7.7*  HCT 22.4*  PLT 54*   ------------------------------------------------------------------------------------------------------------------  Chemistries  Recent Labs  Lab 11/27/17 2053  11/30/17 0322  NA 137   < > 134*  K 4.7   < > 4.6  CL 109   < > 106  CO2 18*   < > 22  GLUCOSE 165*   < > 148*  BUN 44*   < > 33*  CREATININE 1.81*   < > 1.62*  CALCIUM 9.5   < > 8.7*  AST 15  --   --   ALT 11  --   --   ALKPHOS 90  --   --   BILITOT 0.7  --   --    < > = values in this interval not displayed.   ------------------------------------------------------------------------------------------------------------------  Cardiac Enzymes No results for input(s): TROPONINI in the last 168 hours. ------------------------------------------------------------------------------------------------------------------  RADIOLOGY:  No results found.  ASSESSMENT AND PLAN:   Active Problems:   Hip fracture ([place])  1.  Acute displaced right intertrochanteric fracture, status post mechanical fall.   S/p Intramedullary nail- 11/28/17 Continue pain control.  Due to low platelets, I will discuss about anticoagulants with ortho. Stopped lovenox.  2.  Liver failure, status post recent liver transplant, last year.  Patient is doing  well, liver function tests are within normal limits.  Will continue medications per transplant team and monitor liver function closely.  3.  Elevated BUN and creatinine level, likely secondary to CellCept use.  We will start IV fluids to help with a possible prerenal component.  We will continue to monitor kidney  function closely and avoid nephrotoxic medications.  Stable.  4.  Paroxysmal atrial fibrillation, currently in sinus rhythm, rate controlled.  Continue to monitor on telemetry.    Coumadin was stopped a few months ago by his primary team- likely due to thrombocytopenia, monitor.  5.  Diabetes type 2.  Will monitor blood sugars before meals and at bedtime and use insulin treatment during the hospital stay.  6. Acute blood loss anemia   tranasfuse one unit today, Stopped lovenox.   All the records are reviewed and case discussed with Care Management/Social Workerr. Management plans discussed with the patient, family and they are in agreement.  CODE STATUS: full.  TOTAL TIME TAKING CARE OF THIS PATIENT: 35 minutes.   POSSIBLE D/C IN 2-3 DAYS, DEPENDING ON CLINICAL CONDITION.   Mueller Mueller M.D on 11/30/2017   Between 7am to 6pm - Pager - [phone]  After 6pm go to [email or web address] - password [email or web address] [facility]  Office  [phone]  CC: Primary care physician; Hector Hector, MD  Note: This dictation was prepared with Dragon dictation along with smaller phrase technology. Any transcriptional errors that result from this process are unintentional.

## 2017-11-30 NOTE — Care Management Important Message (Signed)
Copy of signed IM left with patient in room.  

## 2017-12-01 LAB — COMPREHENSIVE METABOLIC PANEL
ALT: 5 U/L (ref 0–44)
AST: 9 U/L — ABNORMAL LOW (ref 15–41)
Albumin: 3.1 g/dL — ABNORMAL LOW (ref 3.5–5.0)
Alkaline Phosphatase: 73 U/L (ref 38–126)
Anion gap: 6 (ref 5–15)
BUN: 32 mg/dL — AB (ref 8–23)
CHLORIDE: 103 mmol/L (ref 98–111)
CO2: 24 mmol/L (ref 22–32)
CREATININE: 1.6 mg/dL — AB (ref 0.61–1.24)
Calcium: 8.9 mg/dL (ref 8.9–10.3)
GFR calc Af Amer: 49 mL/min — ABNORMAL LOW (ref >60)
GFR, EST NON AFRICAN AMERICAN: 42 mL/min — AB (ref >60)
Glucose, Bld: 131 mg/dL — ABNORMAL HIGH (ref 70–99)
Potassium: 4.3 mmol/L (ref 3.5–5.1)
Sodium: 133 mmol/L — ABNORMAL LOW (ref 135–145)
Total Bilirubin: 0.8 mg/dL (ref 0.3–1.2)
Total Protein: 5.7 g/dL — ABNORMAL LOW (ref 6.5–8.1)

## 2017-12-01 LAB — BPAM RBC
Blood Product Expiration Date: 201910122359
ISSUE DATE / TIME: 201909191132
UNIT TYPE AND RH: 9500

## 2017-12-01 LAB — TYPE AND SCREEN
ABO/RH(D): O NEG
ANTIBODY SCREEN: NEGATIVE
UNIT DIVISION: 0

## 2017-12-01 LAB — CBC
HEMATOCRIT: 25.3 % — AB (ref 40.0–52.0)
Hemoglobin: 9 g/dL — ABNORMAL LOW (ref 13.0–18.0)
MCH: 31.2 pg (ref 26.0–34.0)
MCHC: 35.5 g/dL (ref 32.0–36.0)
MCV: 87.8 fL (ref 80.0–100.0)
PLATELETS: 56 10*3/uL — AB (ref 150–440)
RBC: 2.88 MIL/uL — ABNORMAL LOW (ref 4.40–5.90)
RDW: 16.3 % — ABNORMAL HIGH (ref 11.5–14.5)
WBC: 3.5 10*3/uL — AB (ref 3.8–10.6)

## 2017-12-01 LAB — GLUCOSE, CAPILLARY
Glucose-Capillary: 120 mg/dL — ABNORMAL HIGH (ref 70–99)
Glucose-Capillary: 135 mg/dL — ABNORMAL HIGH (ref 70–99)

## 2017-12-01 MED ORDER — FERROUS SULFATE 325 (65 FE) MG PO TABS: 325.0000 mg | ORAL_TABLET | Freq: Two times a day (BID) | ORAL | 3 refills | Status: DC

## 2017-12-01 MED ORDER — ENOXAPARIN SODIUM 40 MG/0.4ML ~~LOC~~ SOLN: 40.0000 mg | SUBCUTANEOUS | 0 refills | Status: DC

## 2017-12-01 MED ORDER — ENOXAPARIN SODIUM 40 MG/0.4ML ~~LOC~~ SOLN
40.0000 mg | SUBCUTANEOUS | Status: DC
  Administered 2017-12-01: 40 mg via SUBCUTANEOUS
  Filled 2017-12-01: qty 0.4

## 2017-12-01 MED ORDER — CALCIUM CARBONATE ANTACID 500 MG PO CHEW: 2.0000 | CHEWABLE_TABLET | Freq: Every day | ORAL | 0 refills | Status: DC | PRN

## 2017-12-01 MED ORDER — SENNA 8.6 MG PO TABS: 1.0000 | ORAL_TABLET | Freq: Two times a day (BID) | ORAL | 0 refills | Status: DC

## 2017-12-01 MED ORDER — ASPIRIN 81 MG PO CHEW: 81.0000 mg | CHEWABLE_TABLET | Freq: Every day | ORAL | 0 refills | Status: DC

## 2017-12-01 MED ORDER — HYDROCODONE-ACETAMINOPHEN 5-325 MG PO TABS: 1.0000 | ORAL_TABLET | ORAL | 0 refills | Status: DC | PRN

## 2017-12-01 MED ORDER — BISACODYL 5 MG PO TBEC: 5.0000 mg | DELAYED_RELEASE_TABLET | Freq: Every day | ORAL | 0 refills | Status: DC | PRN

## 2017-12-01 MED ORDER — DOCUSATE SODIUM 100 MG PO CAPS: 100.0000 mg | ORAL_CAPSULE | Freq: Two times a day (BID) | ORAL | 0 refills | Status: DC

## 2017-12-01 MED ORDER — ONDANSETRON HCL 4 MG PO TABS: 4.0000 mg | ORAL_TABLET | Freq: Four times a day (QID) | ORAL | 0 refills | Status: AC | PRN

## 2017-12-01 NOTE — Clinical Social Work Placement (Signed)
   CLINICAL SOCIAL WORK PLACEMENT  NOTE  Date:  12/01/2017  Patient Details  Name: Hector Mueller MRN: [id number] Date of Birth: [birth year]  Clinical Social Work is seeking post-discharge placement for this patient at the [facility] level of care (*CSW will initial, date and re-position this form in  chart as items are completed):  Yes   Patient/family provided with [facility] Work Department's list of facilities offering this level of care within the geographic area requested by the patient (or if unable, by the patient's family).  Yes   Patient/family informed of their freedom to choose among providers that offer the needed level of care, that participate in Medicare, Medicaid or managed care program needed by the patient, have an available bed and are willing to accept the patient.  Yes   Patient/family informed of [facility]'s ownership interest in [facility] and [facility], as well as of the fact that they are under no obligation to receive care at these facilities.  PASRR submitted to EDS on 11/28/17     PASRR number received on 11/28/17     Existing PASRR number confirmed on       FL2 transmitted to all facilities in geographic area requested by pt/family on 11/28/17     FL2 transmitted to all facilities within larger geographic area on       Patient informed that his/her managed care company has contracts with or will negotiate with certain facilities, including the following:        Yes   Patient/family informed of bed offers received.  Patient chooses bed at ([facility] )     Physician recommends and patient chooses bed at      Patient to be transferred to ([facility] ) on 12/01/17.  Patient to be transferred to facility by [facility] )     Patient family notified on 12/01/17 of transfer.  Name of family member notified:  (Patient's wife Bethena Roys is aware of D/C today. )     PHYSICIAN       Additional Comment:     _______________________________________________ Thena Devora, Veronia Beets, LCSW 12/01/2017, 1:35 PM

## 2017-12-01 NOTE — Discharge Instructions (Signed)
Need Lovenox due to Hip fracture and s/p surgery to prevent DVT. Have platelets running 60-80K normally. Check CBC twice a week, if Platelets < 40,000 or he have any signs of bleeding stop lovenox and aspirin and consult hematologist or send to ER.

## 2017-12-01 NOTE — Progress Notes (Signed)
Patient is medically stable for D/C to [facility] today. Per Otila Kluver [facility] liaison patient can come today to room 807. RN will call report and arrange EMS for transport. Clinical Education officer, museum (CSW) sent D/C orders to [facility] via [facility]. Patient is aware of above. CSW contacted patient's wife Bethena Roys and made her aware of above. Medicare bundle case manager is aware of above. Please reconsult if future social work needs arise. CSW signing off.   [employer], LCSW [phone]

## 2017-12-01 NOTE — Discharge Summary (Signed)
[facility] [place] NAME: Hector Mueller    MR#:  [id number]  DATE OF BIRTH:  1951/01/24  DATE OF ADMISSION:  11/27/2017 ADMITTING PHYSICIAN: Amelia Jo, MD  DATE OF DISCHARGE: 12/01/2017   PRIMARY CARE PHYSICIAN: Tama High III, MD    ADMISSION DIAGNOSIS:  Closed displaced intertrochanteric fracture of left femur, initial encounter ([place]) [S72.142A]  DISCHARGE DIAGNOSIS:  Active Problems:   Hip fracture ([place])   SECONDARY DIAGNOSIS:   Past Medical History:  Diagnosis Date  . Atrial fibrillation ([place])   . BPH (benign prostatic hyperplasia)   . Cancer ([place])    liver  . Cirrhosis ([place])   . Diabetes mellitus without complication ([place])   . GERD (gastroesophageal reflux disease)   . Hepatitis C     HOSPITAL COURSE:   1.Acute displaced right intertrochanteric fracture, status post mechanical fall. S/p Intramedullary nail- 11/28/17 Continue pain control.  Due to low platelets,  Stopped lovenox.  Discussed with Hematologist on phone about his case ( Liver transplant, thrombocytopenia, A fib, stopped Coumadin in past, Aspirin use, Hip fracture, need of anticoagulants and current counts) His suggestion was to give Lovenox 40 mg [place] daily, as risk vs benefit favors to give that. Also cont aspirin 81 mg for now and close watch on platelets, if drops < 40- stop lovenox and aspirin. Or bleed, stop these meds.  2.Liver failure, status post recent liver transplant, last year.Patient is doing well,liver function tests are within normal limits.Will continue medications per transplant team and monitor liver function closely.  3.Elevated BUN and creatinine level,likely secondary to CellCept use.We will start IV fluids to help with a possible prerenal component.We will continue to monitor kidney function closely and avoid nephrotoxic medications.  Stable.  4.Paroxysmal atrial fibrillation,currently in sinus  rhythm,rate controlled. Continue to monitor on telemetry.   Coumadin was stopped a few months ago by his primary team- likely due to thrombocytopenia, monitor.  5.Diabetes type 2.Will monitor blood sugars before meals and at bedtime and use insulin treatment during the hospital stay.  6. Acute blood loss anemia   tranasfuse one unit today, stable.  DISCHARGE CONDITIONS:   Stable.  CONSULTS OBTAINED:    DRUG ALLERGIES:  No Known Allergies  DISCHARGE MEDICATIONS:   Allergies as of 12/01/2017   No Known Allergies     Medication List    STOP taking these medications   levofloxacin 500 MG tablet Commonly known as:  LEVAQUIN     TAKE these medications   alendronate 70 MG tablet Commonly known as:  FOSAMAX   aspirin 81 MG chewable tablet Chew 1 tablet (81 mg total) by mouth daily. Start taking on:  12/02/2017   bisacodyl 5 MG EC tablet Commonly known as:  DULCOLAX Take 1 tablet (5 mg total) by mouth daily as needed for moderate constipation.   calcium carbonate 500 MG chewable tablet Commonly known as:  TUMS - dosed in mg elemental calcium Chew 2 tablets (400 mg of elemental calcium total) by mouth daily as needed for indigestion or heartburn.   cycloSPORINE modified 100 MG capsule Commonly known as:  NEORAL Take 100 mg by mouth 2 (two) times daily. Take along with 3 x 25 mg cap twice a day   cycloSPORINE modified 25 MG capsule Commonly known as:  NEORAL Take 75 mg by mouth 2 (two) times daily. Take with 100 mg cap twice a day   docusate sodium 100 MG capsule Commonly known as:  COLACE Take 1 capsule (100 mg total) by mouth 2 (two) times daily.   enoxaparin 40 MG/0.4ML injection Commonly known as:  LOVENOX Inject 0.4 mLs (40 mg total) into the skin daily for 12 days. Start taking on:  12/02/2017   ferrous sulfate 325 (65 FE) MG tablet Take 1 tablet (325 mg total) by mouth 2 (two) times daily with a meal.   HYDROcodone-acetaminophen 5-325 MG  tablet Commonly known as:  NORCO/VICODIN Take 1 tablet by mouth every 4 (four) hours as needed for moderate pain or severe pain (pain score 4-6).   insulin aspart 100 UNIT/ML FlexPen Commonly known as:  NOVOLOG Inject 6 Units into the skin 3 (three) times daily with meals.   LANTUS SOLOSTAR 100 UNIT/ML Solostar Pen Generic drug:  Insulin Glargine INJECT 12 UNITS SUBCUTANEOUSLY (UNDER SKIN) NIGHTLY   magnesium (amino acid chelate) 133 MG tablet Take by mouth.   Melatonin 3 MG Tabs Take by mouth.   mirtazapine 15 MG tablet Commonly known as:  REMERON Take 15 mg by mouth at bedtime.   multivitamin tablet Take 1 tablet by mouth daily.   mycophenolate 250 MG capsule Commonly known as:  CELLCEPT 500 mg 2 (two) times daily.   ondansetron 4 MG tablet Commonly known as:  ZOFRAN Take 1 tablet (4 mg total) by mouth every 6 (six) hours as needed for nausea.   senna 8.6 MG Tabs tablet Commonly known as:  SENOKOT Take 1 tablet (8.6 mg total) by mouth 2 (two) times daily.   sildenafil 20 MG tablet Commonly known as:  REVATIO Take 40 mg by mouth 3 (three) times daily.   spironolactone 50 MG tablet Commonly known as:  ALDACTONE Take 50 mg by mouth daily.   Vitamin D3 2000 units capsule Take 2,000 Units by mouth daily.        DISCHARGE INSTRUCTIONS:    Follow with PMD, ortho clinic. Follow CBC twice a week in facility- stop lovenox an aspirin if Platelet count < 40 or start any bleed.  If you experience worsening of your admission symptoms, develop shortness of breath, life threatening emergency, suicidal or homicidal thoughts you must seek medical attention immediately by calling 911 or calling your MD immediately  if symptoms less severe.  You Must read complete instructions/literature along with all the possible adverse reactions/side effects for all the Medicines you take and that have been prescribed to you. Take any new Medicines after you have completely understood  and accept all the possible adverse reactions/side effects.   Please note  You were cared for by a hospitalist during your hospital stay. If you have any questions about your discharge medications or the care you received while you were in the hospital after you are discharged, you can call the unit and asked to speak with the hospitalist on call if the hospitalist that took care of you is not available. Once you are discharged, your primary care physician will handle any further medical issues. Please note that NO REFILLS for any discharge medications will be authorized once you are discharged, as it is imperative that you return to your primary care physician (or establish a relationship with a primary care physician if you do not have one) for your aftercare needs so that they can reassess your need for medications and monitor your lab values.    Today   CHIEF COMPLAINT:   Chief Complaint  Patient presents with  . Fall  . Hip Pain    HISTORY OF PRESENT ILLNESS:  Hector Mueller  is a 67 y.o. 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 Patient was brought to emergency room status post mechanical fall, at home.  He was watering his garden, when he slipped and fell onto the hard ground, hitting the head to the fence.  Patient denies losing consciousness however, he complains of head pain and right hip pain.  He has been unable to bear weight on the right lower extremity since the fall.  Patient denies having any chest pain or palpitations.  He is usually able to ambulate more than 200 feet without any chest pain or shortness of breath. Blood test done emergency room are notable for elevated creatinine level of 1.8 and elevated BUN level at 44. Hemoglobin level is 11.9 and glucose level is 165.  INR is 1.  EKG shows normal sinus  rhythm, with heart rate of 68 bpm, normal axis, normal intervals, no acute ischemic changes. Head CT shows large RIGHT-sided scalp hematoma, no fractures. Pelvis x-ray shows acute displaced right intertrochanteric fracture. Patient is admitted for surgical repair of his right hip fracture.   VITAL SIGNS:  Blood pressure (!) 151/76, pulse 88, temperature 98.1 F (36.7 C), temperature source Oral, resp. rate 16, height 5\' 9"  (1.753 m), weight 91.6 kg, SpO2 99 %.  I/O:    Intake/Output Summary (Last 24 hours) at 12/01/2017 1151 Last data filed at 12/01/2017 0329 Gross per 24 hour  Intake 455 ml  Output 800 ml  Net -345 ml    PHYSICAL EXAMINATION:   GENERAL:  67 y.o.-year-old patient lying in the bed with no acute distress.  EYES: Pupils equal, round, reactive to light and accommodation. No scleral icterus. Extraocular muscles intact.  HEENT: Head atraumatic, normocephalic. Oropharynx and nasopharynx clear.  NECK:  Supple, no jugular venous distention. No thyroid enlargement, no tenderness.  LUNGS: Normal breath sounds bilaterally, no wheezing, rales,rhonchi or crepitation. No use of accessory muscles of respiration.  CARDIOVASCULAR: S1, S2 normal. No murmurs, rubs, or gallops.  ABDOMEN: Soft, nontender, nondistended. Bowel sounds present. No organomegaly or mass.  EXTREMITIES: No pedal edema, cyanosis, or clubbing. Post surgical dressing right thigh. NEUROLOGIC: Cranial nerves II through XII are intact. Muscle strength 4/5 in all extremities except right lower due to surgery. Sensation intact. Gait not checked.  PSYCHIATRIC: The patient is alert and oriented x 3.  SKIN: No obvious rash, lesion, or ulcer.   DATA REVIEW:   CBC Recent Labs  Lab 12/01/17 0526  WBC 3.5*  HGB 9.0*  HCT 25.3*  PLT 56*    Chemistries  Recent Labs  Lab 12/01/17 0526  NA 133*  K 4.3  CL 103  CO2 24  GLUCOSE 131*  BUN 32*  CREATININE 1.60*  CALCIUM 8.9  AST 9*  ALT 5  ALKPHOS 73  BILITOT  0.8    Cardiac Enzymes No results for input(s): TROPONINI in the last 168 hours.  Microbiology Results  Results for orders placed or performed during the hospital encounter of 11/27/17  Surgical PCR screen     Status: None   Collection Time: 11/27/17 11:41 PM  Result Value Ref Range Status   MRSA, PCR NEGATIVE NEGATIVE Final   Staphylococcus aureus NEGATIVE NEGATIVE Final    Comment: (NOTE) The Xpert SA Assay (FDA approved for NASAL specimens in patients 23 years of age and older), is one component of a  comprehensive surveillance program. It is not intended to diagnose infection nor to guide or monitor treatment. Performed at [facility], [address], [place],  [address]     RADIOLOGY:  No results found.  EKG:   Orders placed or performed during the hospital encounter of 11/27/17  . ED EKG  . ED EKG      Management plans discussed with the patient, family and they are in agreement.  CODE STATUS:     Code Status Orders  (From admission, onward)         Start     Ordered   11/28/17 0050  Full code  Continuous     11/28/17 0049        Code Status History    This patient has a current code status but no historical code status.    Advance Directive Documentation     Most Recent Value  Type of Advance Directive  Healthcare Power of Attorney, Living will  Pre-existing out of facility DNR order (yellow form or pink MOST form)  -  "MOST" Form in Place?  -      TOTAL TIME TAKING CARE OF THIS PATIENT: 35 minutes.    Vaughan Basta M.D on 12/01/2017 at 11:51 AM  Between 7am to 6pm - Pager - [phone]  After 6pm go to [email or web address] - password [email or web address] [facility]  Office  [phone]  CC: Primary care physician; [name], MD   Note: This dictation was prepared with Dragon dictation along with smaller phrase technology. Any transcriptional errors that result from this process are unintentional.

## 2017-12-01 NOTE — NC FL2 (Signed)
[place] LEVEL OF CARE SCREENING TOOL     IDENTIFICATION  Patient Name: Hector Mueller Birthdate: 06-14-1950 Sex: male Admission Date (Current Location): 11/27/2017  [facility] [place] Number:  Engineering geologist and Address:  [facility], [address], [place], [place] [address]      Provider Number: [id number]  Attending Physician Name and Address:  No att. providers found  Relative Name and Phone Number:       Current Level of Care: Hospital Recommended Level of Care: [place] Prior Approval Number:    Date Approved/Denied:   PASRR Number: [id number])  Discharge Plan: SNF    Current Diagnoses: Patient Active Problem List   Diagnosis Date Noted  . Hip fracture ([place]) 11/27/2017    Orientation RESPIRATION BLADDER Height & Weight     Self, Time, Situation, Place  O2(2 Liters Oxygen. ) Continent Weight: 202 lb (91.6 kg) Height:  5\' 9"  (175.3 cm)  BEHAVIORAL SYMPTOMS/MOOD NEUROLOGICAL BOWEL NUTRITION STATUS      Continent Diet(Diet: NPO for surgery to be advanced. )  AMBULATORY STATUS COMMUNICATION OF NEEDS Skin   Extensive Assist Verbally Surgical wounds                       Personal Care Assistance Level of Assistance  Bathing, Feeding, Dressing Bathing Assistance: Limited assistance Feeding assistance: Independent Dressing Assistance: Limited assistance     Functional Limitations Info  Sight, Hearing, Speech Sight Info: Adequate Hearing Info: Adequate Speech Info: Adequate    SPECIAL CARE FACTORS FREQUENCY  PT (By licensed PT), OT (By licensed OT)     PT Frequency: (5) OT Frequency: (5)            Contractures      Additional Factors Info  Code Status, Allergies Code Status Info: (Full Code. ) Allergies Info: (No Known Allergies. )           Current Medications (12/01/2017):  This is the current hospital active medication list Current Facility-Administered Medications   Medication Dose Route Frequency Provider Last Rate Last Dose  . acetaminophen (TYLENOL) tablet 650 mg  650 mg Oral Q6H PRN Earnestine Leys, MD       Or  . acetaminophen (TYLENOL) suppository 650 mg  650 mg Rectal Q6H PRN Earnestine Leys, MD      . alum & mag hydroxide-simeth (MAALOX/MYLANTA) 200-200-20 MG/5ML suspension 30 mL  30 mL Oral Q4H PRN Earnestine Leys, MD   30 mL at 11/30/17 1724  . aspirin chewable tablet 81 mg  81 mg Oral Daily Vaughan Basta, MD   81 mg at 12/01/17 0916  . bisacodyl (DULCOLAX) EC tablet 5 mg  5 mg Oral Daily PRN Earnestine Leys, MD      . bisacodyl (DULCOLAX) suppository 10 mg  10 mg Rectal Daily PRN Earnestine Leys, MD      . calcium carbonate (TUMS - dosed in mg elemental calcium) chewable tablet 400 mg of elemental calcium  2 tablet Oral Daily PRN Mayo, Pete Pelt, MD   400 mg of elemental calcium at 11/30/17 2012  . cholecalciferol (VITAMIN D) tablet 2,000 Units  2,000 Units Oral Daily Earnestine Leys, MD   2,000 Units at 12/01/17 [phone]  . cycloSPORINE modified (NEORAL) capsule 100 mg  100 mg Oral BID Earnestine Leys, MD   100 mg at 12/01/17 1043  . cycloSPORINE modified (NEORAL) capsule 75 mg  75 mg Oral BID Earnestine Leys, MD   75 mg  at 12/01/17 1043  . docusate sodium (COLACE) capsule 100 mg  100 mg Oral BID Earnestine Leys, MD   100 mg at 11/30/17 2013  . enoxaparin (LOVENOX) injection 40 mg  40 mg Subcutaneous Q24H Vaughan Basta, MD   40 mg at 12/01/17 0914  . ferrous sulfate tablet 325 mg  325 mg Oral Q breakfast Earnestine Leys, MD   325 mg at 12/01/17 0917  . HYDROcodone-acetaminophen (NORCO) 7.5-325 MG per tablet 1 tablet  1 tablet Oral Q6H PRN Earnestine Leys, MD   1 tablet at 12/01/17 0435  . HYDROcodone-acetaminophen (NORCO/VICODIN) 5-325 MG per tablet 1 tablet  1 tablet Oral Q6H PRN Earnestine Leys, MD   1 tablet at 12/01/17 1047  . insulin aspart (novoLOG) injection 0-5 Units  0-5 Units Subcutaneous QHS Earnestine Leys, MD      . insulin aspart  (novoLOG) injection 0-9 Units  0-9 Units Subcutaneous TID WC Earnestine Leys, MD   1 Units at 12/01/17 1255  . insulin aspart (novoLOG) injection 6 Units  6 Units Subcutaneous TID WC Earnestine Leys, MD   6 Units at 12/01/17 0914  . insulin glargine (LANTUS) injection 12 Units  12 Units Subcutaneous QHS Vaughan Basta, MD   12 Units at 11/30/17 2119  . LORazepam (ATIVAN) injection 0.5 mg  0.5 mg Intravenous BID BM & HS PRN Earnestine Leys, MD      . magnesium hydroxide (MILK OF MAGNESIA) suspension 30 mL  30 mL Oral Daily PRN Earnestine Leys, MD   30 mL at 11/30/17 0942  . magnesium oxide (MAG-OX) tablet 400 mg  400 mg Oral Daily Earnestine Leys, MD   400 mg at 12/01/17 0916  . Melatonin TABS 5 mg  5 mg Oral QHS Earnestine Leys, MD   5 mg at 11/30/17 2012  . menthol-cetylpyridinium (CEPACOL) lozenge 3 mg  1 lozenge Oral PRN Earnestine Leys, MD       Or  . phenol (CHLORASEPTIC) mouth spray 1 spray  1 spray Mouth/Throat PRN Earnestine Leys, MD      . metoCLOPramide (REGLAN) tablet 5-10 mg  5-10 mg Oral Q8H PRN Earnestine Leys, MD       Or  . metoCLOPramide (REGLAN) injection 5-10 mg  5-10 mg Intravenous Q8H PRN Earnestine Leys, MD      . mirtazapine (REMERON) tablet 15 mg  15 mg Oral Tora Duck, MD   15 mg at 11/30/17 2013  . morphine 2 MG/ML injection 1 mg  1 mg Intravenous Q2H PRN Earnestine Leys, MD   1 mg at 11/29/17 1746  . mycophenolate (CELLCEPT) capsule 500 mg  500 mg Oral BID Vaughan Basta, MD   500 mg at 12/01/17 1042  . ondansetron (ZOFRAN) tablet 4 mg  4 mg Oral Q6H PRN Earnestine Leys, MD       Or  . ondansetron [facility]) injection 4 mg  4 mg Intravenous Q6H PRN Earnestine Leys, MD   4 mg at 11/30/17 1732  . senna (SENOKOT) tablet 8.6 mg  1 tablet Oral BID Earnestine Leys, MD   8.6 mg at 11/30/17 2013  . sildenafil (REVATIO) tablet 40 mg  40 mg Oral TID Earnestine Leys, MD   40 mg at 12/01/17 0917  . sodium phosphate (FLEET) 7-19 GM/118ML enema 1 enema  1 enema Rectal Once  PRN Earnestine Leys, MD      . traZODone (DESYREL) tablet 25 mg  25 mg Oral QHS PRN Earnestine Leys, MD   25 mg at 11/28/17 2143  .  zolpidem (AMBIEN) tablet 5 mg  5 mg Oral QHS PRN Earnestine Leys, MD       Current Outpatient Medications  Medication Sig Dispense Refill  . alendronate (FOSAMAX) 70 MG tablet     . Cholecalciferol (VITAMIN D3) 2000 units capsule Take 2,000 Units by mouth daily.    . cycloSPORINE modified (NEORAL) 100 MG capsule Take 100 mg by mouth 2 (two) times daily. Take along with 3 x 25 mg cap twice a day    . cycloSPORINE modified (NEORAL) 25 MG capsule Take 75 mg by mouth 2 (two) times daily. Take with 100 mg cap twice a day    . insulin aspart (NOVOLOG) 100 UNIT/ML FlexPen Inject 6 Units into the skin 3 (three) times daily with meals.     . Insulin Glargine (LANTUS SOLOSTAR) 100 UNIT/ML Solostar Pen INJECT 12 UNITS SUBCUTANEOUSLY (UNDER SKIN) NIGHTLY    . Melatonin 3 MG TABS Take by mouth.    . mirtazapine (REMERON) 15 MG tablet Take 15 mg by mouth at bedtime.     . mycophenolate (CELLCEPT) 250 MG capsule 500 mg 2 (two) times daily.     . sildenafil (REVATIO) 20 MG tablet Take 40 mg by mouth 3 (three) times daily.     Marland Kitchen Specialty Vitamins Products (MAGNESIUM, AMINO ACID CHELATE,) 133 MG tablet Take by mouth.    Derrill Memo ON 12/02/2017] aspirin 81 MG chewable tablet Chew 1 tablet (81 mg total) by mouth daily. 30 tablet 0  . bisacodyl (DULCOLAX) 5 MG EC tablet Take 1 tablet (5 mg total) by mouth daily as needed for moderate constipation. 30 tablet 0  . calcium carbonate (TUMS - DOSED IN MG ELEMENTAL CALCIUM) 500 MG chewable tablet Chew 2 tablets (400 mg of elemental calcium total) by mouth daily as needed for indigestion or heartburn. 20 tablet 0  . docusate sodium (COLACE) 100 MG capsule Take 1 capsule (100 mg total) by mouth 2 (two) times daily. 10 capsule 0  . [START ON 12/02/2017] enoxaparin (LOVENOX) 40 MG/0.4ML injection Inject 0.4 mLs (40 mg total) into the skin daily for 12  days. 12 Syringe 0  . ferrous sulfate 325 (65 FE) MG tablet Take 1 tablet (325 mg total) by mouth 2 (two) times daily with a meal. 60 tablet 3  . HYDROcodone-acetaminophen (NORCO/VICODIN) 5-325 MG tablet Take 1 tablet by mouth every 4 (four) hours as needed for moderate pain or severe pain (pain score 4-6). 20 tablet 0  . Multiple Vitamin (MULTIVITAMIN) tablet Take 1 tablet by mouth daily.    . ondansetron (ZOFRAN) 4 MG tablet Take 1 tablet (4 mg total) by mouth every 6 (six) hours as needed for nausea. 20 tablet 0  . senna (SENOKOT) 8.6 MG TABS tablet Take 1 tablet (8.6 mg total) by mouth 2 (two) times daily. 120 each 0  . spironolactone (ALDACTONE) 50 MG tablet Take 50 mg by mouth daily.       Discharge Medications: Please see discharge summary for a list of discharge medications.  Relevant Imaging Results:  Relevant Lab Results:   Additional Information (SSN: [id number])  Lareen Mullings, [place] Beets, LCSW

## 2017-12-01 NOTE — Progress Notes (Signed)
Pt ready for discharge to SNF per MD. Pt assessment unchanged from this morning. Report called to [facility]; all questions answered and discharge instructions reviewed. EMS transportation set up for pt by RN. PIV removed, VSS. Pt belongings packed, home med, cyclosporine, returned to pt from pharmacy.  Hector Mueller

## 2017-12-01 NOTE — Progress Notes (Signed)
Physical Therapy Treatment Patient Details Name: Hector Mueller MRN: [id number] DOB: 1950-10-19 Today's Date: 12/01/2017    History of Present Illness Patient is a 67 year old male admitted s/p IM nailing of R hip following a fall at home.  PMH includes Hep C, cirrhosis, CA, BPH and atrial fibrillation.    PT Comments    Pt is pleasant on arrival and states he is feeling better than yesterday. He performs bed mobility with dec VC for technique, requires supervision to mod A for ther-ex due to hip musculature weakness and pain. Pt has improved bed mobility, no longer needs +2, heavy VC for sequencing used, max A to control trunk. No difficulties with balance at EOB. Pt progressed amb today demonstrating improved endurance, requires Min A and VC for sequencing. PWB maintained t/o tx. Will progress strength, balance, amb, and endurance as able to promote return to PLOF.  Follow Up Recommendations  SNF     Equipment Recommendations  None recommended by PT    Recommendations for Other Services       Precautions / Restrictions Precautions Precautions: Fall Precaution Comments: High Fall risk Restrictions Weight Bearing Restrictions: Yes RLE Weight Bearing: Partial weight bearing    Mobility  Bed Mobility Overal bed mobility: Needs Assistance Bed Mobility: Supine to Sit     Supine to sit: Max assist     General bed mobility comments: Pt requires heavy cueing for sequencing and phyical assist to control trunk and RLE to come to EOB.   Transfers Overall transfer level: Needs assistance Equipment used: Rolling walker (2 wheeled) Transfers: Sit to/from Stand Sit to Stand: Min assist;From elevated surface         General transfer comment: Pt able to rise from bedside with min A, VC for sequencing and RW use. Prior to performing transfer pt was eduated on maintaining PWB status, which was maintained t/o.  Ambulation/Gait Ambulation/Gait assistance: Min assist Gait Distance  (Feet): 10 Feet Assistive device: Rolling walker (2 wheeled) Gait Pattern/deviations: Step-to pattern Gait velocity: Decreased from baseline.   General Gait Details: Pt amb with dec step length and foot clearance. Used RW for UE support and maintaining PWB status. Heavy VC used for sequencing. Requires increased time and effort to perform.    Stairs             Wheelchair Mobility    Modified Rankin (Stroke Patients Only)       Balance Overall balance assessment: Needs assistance Sitting-balance support: Feet supported;Single extremity supported Sitting balance-Leahy Scale: Fair Sitting balance - Comments: Pt has good trunk control and is able to maintain posture without physical assist.   Standing balance support: Bilateral upper extremity supported Standing balance-Leahy Scale: Poor Standing balance comment: Pt requires UE support to maintain balance and PWB status in standing.                            Cognition Arousal/Alertness: Awake/alert Behavior During Therapy: WFL for tasks assessed/performed Overall Cognitive Status: Within Functional Limits for tasks assessed                                 General Comments: Follows commands consistently.      Exercises Other Exercises Other Exercises: Supine RLE AROM ankle pumps, glute sets and quad sets. RLE AAROM SAQs, hip ABD/ADD, and SLRs with min-mod A. All ther-ex performed 15 x reps.  General Comments        Pertinent Vitals/Pain Pain Assessment: 0-10 Pain Score: 6  Pain Location: R hip with mobility Pain Descriptors / Indicators: Grimacing;Guarding;Sharp Pain Intervention(s): Limited activity within patient's tolerance;Monitored during session    Home Living                      Prior Function            PT Goals (current goals can now be found in the care plan section) Acute Rehab PT Goals Patient Stated Goal: To return home as soon as possible. PT Goal  Formulation: With patient/family Time For Goal Achievement: 12/13/17 Potential to Achieve Goals: Good Progress towards PT goals: Progressing toward goals    Frequency    BID      PT Plan Current plan remains appropriate    Co-evaluation              AM-PAC PT "6 Clicks" Daily Activity  Outcome Measure  Difficulty turning over in bed (including adjusting bedclothes, sheets and blankets)?: Unable Difficulty moving from lying on back to sitting on the side of the bed? : Unable Difficulty sitting down on and standing up from a chair with arms (e.g., wheelchair, bedside commode, etc,.)?: Unable Help needed moving to and from a bed to chair (including a wheelchair)?: A Little Help needed walking in hospital room?: A Little Help needed climbing 3-5 steps with a railing? : Total 6 Click Score: 10    End of Session Equipment Utilized During Treatment: Gait belt Activity Tolerance: Patient tolerated treatment well Patient left: in chair;with call bell/phone within reach;with chair alarm set;with SCD's reapplied;with nursing/sitter in room Nurse Communication: Mobility status PT Visit Diagnosis: Unsteadiness on feet (R26.81);Muscle weakness (generalized) (M62.81);Pain Pain - Right/Left: Right Pain - part of body: Hip     Time: 3790-2409 PT Time Calculation (min) (ACUTE ONLY): 25 min  Charges:                        Algis Downs, SPT    Algis Downs 12/01/2017, 12:36 PM

## 2017-12-11 MED ORDER — BLOOD SUGAR DIAGNOSTIC STRIPS
Freq: Three times a day (TID) | 7 refills | 0 days | Status: CP
Start: 2017-12-11 — End: 2018-07-16

## 2017-12-11 NOTE — Unmapped (Signed)
Spoke with patient's wife & she states patient fell and broke his hip, so he was in the hospital, but now is in [place].  He is currently in [facility] in [place], [place].  Wife states he is excepted to stay in rehab for possible 2 plus weeks and they are also providing all of his medications except for Revatio and they need that one sent out to them.  Advised patient that the Rph will call you back to speak with you about the medication and then schedule out the medication for delivery. Patient's wife provided understanding.

## 2017-12-11 NOTE — Unmapped (Signed)
[facility] Refill and Clinical Coordination Note  Medication(s): revatio = pt has received from [place] before, just got PA reapproved, wife aware of $25.95 copay  Went over all meds with pt on call today. Besides meds listed in this note, pt denies needing ANYTHING else filled at this time. Pt aware to call us with any needs/concerns/refills if need arises prior to our next scheduled refill call.  Wife requests call back in 2 weeks for all other meds.    Jeffrey Ward, DOB: 08-26-1950  Phone: [phone] (home) , Alternate phone contact: N/A  Shipping address:[address] [name] [address]   Phone or address changes today?: No  All above HIPAA information verified.  Insurance changes? No    Completed refill and clinical call assessment today to schedule patient's medication shipment from the [facility] [phone]).      MEDICATION RECONCILIATION    Confirmed the medication and dosage are correct and have not changed: Yes, regimen is correct and unchanged.    Were there any changes to your medication(s) in the past month:  No, there are no changes reported at this time.    ADHERENCE    Is this medicine transplant or covered by Medicare Part B? No.    Not Applicable not filling any part b meds today    Did you miss any doses in the past 4 weeks? No missed doses reported.  Adherence counseling provided? Not needed     SIDE EFFECT MANAGEMENT    Are you tolerating your medication?:  Jeffrey Ward reports tolerating the medication.  Side effect management discussed: None      Therapy is appropriate and should be continued.    Evidence of clinical benefit: See Epic note from 09/25/17      FINANCIAL/SHIPPING    Delivery Scheduled: Yes, Expected medication delivery date: 12/15/17 via ups     Additional medications refilled: No additional medications/refills needed at this time.see above, wife wants call back in 2 weeks    The patient will receive a drug information handout for each medication shipped and additional FDA Medication Guides as required.      Jeffrey Ward did not have any additional questions at this time.    Delivery address confirmed in Epic.     We will follow up with patient monthly for standard refill processing and delivery.      Thank you,  Thad Ranger   [facility] Specialty Pharmacist

## 2017-12-11 NOTE — Unmapped (Signed)
Called [facility] [phone] since patient is currently in rehab at their facility due to a broken hip from a fall (found out from pharmacy this occurred).  Spoke with patients nurse [place] who confirmed she can draw a drug level on patient.  Instructed to send orders to Fax # [phone]. Sent lab order.

## 2017-12-12 ENCOUNTER — Other Ambulatory Visit
Admission: RE | Admit: 2017-12-12 | Discharge: 2017-12-12 | Disposition: A | Discharge status: Home / Self Care | Payer: Medicare Other | Source: Ambulatory Visit | Attending: Family Medicine | Admitting: Family Medicine

## 2017-12-12 DIAGNOSIS — K769 Liver disease, unspecified: Secondary | ICD-10-CM | POA: Insufficient documentation

## 2017-12-12 DIAGNOSIS — Z944 Liver transplant status: Secondary | ICD-10-CM | POA: Insufficient documentation

## 2017-12-12 LAB — MAGNESIUM: Magnesium: 1.9 mg/dL (ref 1.7–2.4)

## 2017-12-12 LAB — CBC WITH DIFFERENTIAL/PLATELET
BASOS ABS: 0 10*3/uL (ref 0–0.1)
BASOS PCT: 1 %
EOS PCT: 1 %
Eosinophils Absolute: 0.1 10*3/uL (ref 0–0.7)
HCT: 27.7 % — ABNORMAL LOW (ref 40.0–52.0)
Hemoglobin: 9.9 g/dL — ABNORMAL LOW (ref 13.0–18.0)
LYMPHS PCT: 17 %
Lymphs Abs: 0.6 10*3/uL — ABNORMAL LOW (ref 1.0–3.6)
MCH: 32.4 pg (ref 26.0–34.0)
MCHC: 35.7 g/dL (ref 32.0–36.0)
MCV: 90.8 fL (ref 80.0–100.0)
MONOS PCT: 8 %
Monocytes Absolute: 0.3 10*3/uL (ref 0.2–1.0)
Neutro Abs: 2.7 10*3/uL (ref 1.4–6.5)
Neutrophils Relative %: 73 %
PLATELETS: 210 10*3/uL (ref 150–440)
RBC: 3.05 MIL/uL — ABNORMAL LOW (ref 4.40–5.90)
RDW: 16.2 % — AB (ref 11.5–14.5)
WBC: 3.7 10*3/uL — ABNORMAL LOW (ref 3.8–10.6)

## 2017-12-12 LAB — COMPREHENSIVE METABOLIC PANEL
ALBUMIN: 3.4 g/dL — AB (ref 3.5–5.0)
ALK PHOS: 130 U/L — AB (ref 38–126)
ALT: 9 U/L (ref 0–44)
AST: 16 U/L (ref 15–41)
Anion gap: 7 (ref 5–15)
BILIRUBIN TOTAL: 0.5 mg/dL (ref 0.3–1.2)
BUN: 30 mg/dL — AB (ref 8–23)
CALCIUM: 9.2 mg/dL (ref 8.9–10.3)
CO2: 20 mmol/L — AB (ref 22–32)
Chloride: 104 mmol/L (ref 98–111)
Creatinine, Ser: 1.49 mg/dL — ABNORMAL HIGH (ref 0.61–1.24)
GFR calc Af Amer: 53 mL/min — ABNORMAL LOW (ref >60)
GFR calc non Af Amer: 46 mL/min — ABNORMAL LOW (ref >60)
GLUCOSE: 273 mg/dL — AB (ref 70–99)
POTASSIUM: 4.7 mmol/L (ref 3.5–5.1)
SODIUM: 131 mmol/L — AB (ref 135–145)
TOTAL PROTEIN: 5.8 g/dL — AB (ref 6.5–8.1)

## 2017-12-12 LAB — PHOSPHORUS: PHOSPHORUS: 2.7 mg/dL (ref 2.5–4.6)

## 2017-12-12 LAB — GAMMA GT: GGT: 31 U/L (ref 7–50)

## 2017-12-12 LAB — BILIRUBIN, DIRECT: Bilirubin, Direct: 0.1 mg/dL (ref 0.0–0.2)

## 2017-12-14 LAB — CYCLOSPORINE: Cyclosporine, LabCorp: NOT DETECTED ng/mL (ref 100–400)

## 2017-12-14 MED ORDER — NITROFURANTOIN MONOHYDRATE/MACROCRYSTALS 100 MG CAPSULE
ORAL_CAPSULE | Freq: Every day | ORAL | 0 refills | 0 days | Status: SS
Start: 2017-12-14 — End: 2018-11-08

## 2017-12-14 MED FILL — SILDENAFIL (PULMONARY HYPERTENSION) 20 MG TABLET: 30 days supply | Qty: 180 | Fill #0 | Status: AC

## 2017-12-14 MED FILL — SILDENAFIL (PULMONARY HYPERTENSION) 20 MG TABLET: 30 days supply | Qty: 180 | Fill #0

## 2017-12-15 MED ORDER — SULFAMETHOXAZOLE 800 MG-TRIMETHOPRIM 160 MG TABLET
ORAL_TABLET | Freq: Two times a day (BID) | ORAL | 0 refills | 0 days
Start: 2017-12-15 — End: 2017-12-22

## 2017-12-15 NOTE — Unmapped (Signed)
Sent records request to rehab facility.

## 2017-12-18 ENCOUNTER — Institutional Professional Consult (permissible substitution): Admit: 2017-12-18 | Discharge: 2017-12-19 | Payer: MEDICARE

## 2017-12-18 DIAGNOSIS — R001 Bradycardia, unspecified: Principal | ICD-10-CM

## 2017-12-18 DIAGNOSIS — Z45018 Encounter for adjustment and management of other part of cardiac pacemaker: Secondary | ICD-10-CM

## 2017-12-18 LAB — COMPREHENSIVE METABOLIC PANEL
ALKALINE PHOSPHATASE: 130 U/L — ABNORMAL HIGH
ALT (SGPT): 9 U/L
BILIRUBIN TOTAL: 0.5 mg/dL
BLOOD UREA NITROGEN: 30 mg/dL — ABNORMAL HIGH
CALCIUM: 9.2 mg/dL
CHLORIDE: 104 mmol/L
CO2: 20 mmol/L — ABNORMAL LOW
CREATININE: 1.49 mg/dL — ABNORMAL HIGH
EGFR MDRD AF AMER: 53 mL/min/{1.73_m2} — ABNORMAL LOW
EGFR MDRD NON AF AMER: 46 mL/min/{1.73_m2} — ABNORMAL LOW
GLUCOSE RANDOM: 273 mg/dL — ABNORMAL HIGH
POTASSIUM: 4.7 mmol/L
PROTEIN TOTAL: 5.8 g/dL — ABNORMAL LOW

## 2017-12-18 LAB — CBC W/ DIFFERENTIAL
BASOPHILS ABSOLUTE COUNT: 0 10*9/L
EOSINOPHILS ABSOLUTE COUNT: 0.1 10*9/L
HEMATOCRIT: 27.7 % — ABNORMAL LOW
HEMOGLOBIN: 9.9 g/dL — ABNORMAL LOW
LYMPHOCYTES ABSOLUTE COUNT: 0.6 10*9/L — ABNORMAL LOW
MONOCYTES ABSOLUTE COUNT: 0.3 10*9/L
NEUTROPHILS ABSOLUTE COUNT: 2.7 10*9/L
PLATELET COUNT: 210 10*9/L
WBC ADJUSTED: 3.7 10*9/L — ABNORMAL LOW

## 2017-12-18 LAB — HEMOGLOBIN: Lab: 9.9 — ABNORMAL LOW

## 2017-12-18 LAB — PHOSPHORUS: Lab: 2.7

## 2017-12-18 LAB — BLOOD UREA NITROGEN: Lab: 30 — ABNORMAL HIGH

## 2017-12-18 LAB — GAMMA GLUTAMYL TRANSFERASE: Lab: 31

## 2017-12-18 LAB — ALBUMIN: Lab: 3.4 — ABNORMAL LOW

## 2017-12-18 LAB — MAGNESIUM: Lab: 1.9

## 2017-12-18 LAB — BILIRUBIN DIRECT: Lab: 0.1

## 2017-12-19 NOTE — Unmapped (Signed)
Called patient to reschedule 10/10 appointment, spoke with patients wife who confirmed they can come on 10/17 . Provided wife with appointment time and location, wife stated verbal understanding and said they will be able to come.

## 2017-12-19 NOTE — Unmapped (Signed)
Patients wife called to inform coordinator that they can't make scheduled appointment for 09/14/2022 10-10 due to a death of a close friend.   TPA to rescheduled appt.     Also wife notified coordinator that patient is out of rehab and home with a walker going to therapy 2-3 times a week.

## 2017-12-22 NOTE — Unmapped (Signed)
REMOTE PACEMAKER CHECK (EVERY 90 DAYS)  Date/Time: 12/18/2017 2:12 PM  Performed by: Skipper Cliche, RN  Authorized by: Dorcas Carrow, MD   Comments: Normal device function, Tested Lead measurements stable and within normal range, Adequate battery reserve  No significant new  ventricular arrhythmias      Cardiac Implantable Electronic Device Remote Monitoring   Manufacturer of Device: Medtronic       Type of Device: single chamber Pacemaker    Presenting Rhythm: VS     DEVICE AND LEAD INFORMATION          Device Detected Event Impression:   none    Congestive Heart Failure Surveillance::      Not Applicable    Plan:     Continue Routine Remote Monitoring     ?? Please see downloaded PDF file of transmission under Media Tab  for full details of device interrogation to include, when applicable,  battery status/charge time, lead trend data, and programmed parameters.

## 2017-12-25 MED FILL — NITROFURANTOIN MONOHYDRATE/MACROCRYSTALS 100 MG CAPSULE: ORAL | 7 days supply | Qty: 13 | Fill #0

## 2017-12-25 MED FILL — ALENDRONATE 70 MG TABLET: 28 days supply | Qty: 4 | Fill #1 | Status: AC

## 2017-12-25 MED FILL — ALENDRONATE 70 MG TABLET: ORAL | 28 days supply | Qty: 4 | Fill #1

## 2017-12-25 MED FILL — NITROFURANTOIN MONOHYDRATE/MACROCRYSTALS 100 MG CAPSULE: 7 days supply | Qty: 13 | Fill #0 | Status: AC

## 2017-12-26 MED ORDER — HYDROCODONE 5 MG-ACETAMINOPHEN 325 MG TABLET
ORAL_TABLET | 0 refills | 0 days
Start: 2017-12-26 — End: 2018-01-18

## 2017-12-26 MED FILL — HYDROCODONE 5 MG-ACETAMINOPHEN 325 MG TABLET: 7 days supply | Qty: 30 | Fill #0

## 2017-12-26 MED FILL — HYDROCODONE 5 MG-ACETAMINOPHEN 325 MG TABLET: 7 days supply | Qty: 30 | Fill #0 | Status: AC

## 2017-12-28 ENCOUNTER — Ambulatory Visit
Admit: 2017-12-28 | Discharge: 2017-12-28 | Payer: MEDICARE | Attending: Student in an Organized Health Care Education/Training Program | Primary: Student in an Organized Health Care Education/Training Program

## 2017-12-28 ENCOUNTER — Ambulatory Visit: Admit: 2017-12-28 | Discharge: 2017-12-28 | Payer: MEDICARE

## 2017-12-28 DIAGNOSIS — Z7901 Long term (current) use of anticoagulants: Secondary | ICD-10-CM

## 2017-12-28 DIAGNOSIS — Z5181 Encounter for therapeutic drug level monitoring: Secondary | ICD-10-CM

## 2017-12-28 DIAGNOSIS — Z944 Liver transplant status: Principal | ICD-10-CM

## 2017-12-28 DIAGNOSIS — Z79899 Other long term (current) drug therapy: Secondary | ICD-10-CM

## 2017-12-28 DIAGNOSIS — Z23 Encounter for immunization: Secondary | ICD-10-CM

## 2017-12-28 DIAGNOSIS — K769 Liver disease, unspecified: Secondary | ICD-10-CM

## 2017-12-28 LAB — COMPREHENSIVE METABOLIC PANEL
ALBUMIN: 3.7 g/dL (ref 3.5–5.0)
ALT (SGPT): 21 U/L (ref 19–72)
ANION GAP: 9 mmol/L (ref 7–15)
AST (SGOT): 15 U/L — ABNORMAL LOW (ref 19–55)
BILIRUBIN TOTAL: 0.9 mg/dL (ref 0.0–1.2)
BLOOD UREA NITROGEN: 41 mg/dL — ABNORMAL HIGH (ref 7–21)
BUN / CREAT RATIO: 23
CALCIUM: 9.7 mg/dL (ref 8.5–10.2)
CHLORIDE: 103 mmol/L (ref 98–107)
CO2: 22 mmol/L (ref 22.0–30.0)
CREATININE: 1.79 mg/dL — ABNORMAL HIGH (ref 0.70–1.30)
EGFR CKD-EPI AA MALE: 44 mL/min/{1.73_m2} — ABNORMAL LOW (ref >=60)
EGFR CKD-EPI NON-AA MALE: 38 mL/min/{1.73_m2} — ABNORMAL LOW (ref >=60)
GLUCOSE RANDOM: 124 mg/dL — ABNORMAL HIGH (ref 65–99)
POTASSIUM: 5 mmol/L (ref 3.5–5.0)
PROTEIN TOTAL: 6.2 g/dL — ABNORMAL LOW (ref 6.5–8.3)

## 2017-12-28 LAB — BILIRUBIN DIRECT: Bilirubin.glucuronidated:MCnc:Pt:Ser/Plas:Qn:: 0.4

## 2017-12-28 LAB — BILIRUBIN, DIRECT: BILIRUBIN DIRECT: 0.4 mg/dL (ref 0.00–0.40)

## 2017-12-28 LAB — MAGNESIUM: Magnesium:MCnc:Pt:Ser/Plas:Qn:: 1.8

## 2017-12-28 LAB — CBC W/ AUTO DIFF
BASOPHILS ABSOLUTE COUNT: 0 10*9/L (ref 0.0–0.1)
BASOPHILS RELATIVE PERCENT: 0.5 %
EOSINOPHILS ABSOLUTE COUNT: 0.1 10*9/L (ref 0.0–0.4)
HEMATOCRIT: 31.9 % — ABNORMAL LOW (ref 41.0–53.0)
HEMOGLOBIN: 10.5 g/dL — ABNORMAL LOW (ref 13.5–17.5)
LARGE UNSTAINED CELLS: 2 % (ref 0–4)
LYMPHOCYTES ABSOLUTE COUNT: 1.3 10*9/L — ABNORMAL LOW (ref 1.5–5.0)
LYMPHOCYTES RELATIVE PERCENT: 37.4 %
MEAN CORPUSCULAR HEMOGLOBIN CONC: 33 g/dL (ref 31.0–37.0)
MEAN CORPUSCULAR VOLUME: 93.5 fL (ref 80.0–100.0)
MEAN PLATELET VOLUME: 8.6 fL (ref 7.0–10.0)
MONOCYTES ABSOLUTE COUNT: 0.2 10*9/L (ref 0.2–0.8)
MONOCYTES RELATIVE PERCENT: 6.8 %
NEUTROPHILS ABSOLUTE COUNT: 1.7 10*9/L — ABNORMAL LOW (ref 2.0–7.5)
NEUTROPHILS RELATIVE PERCENT: 51.8 %
PLATELET COUNT: 87 10*9/L — ABNORMAL LOW (ref 150–440)
RED BLOOD CELL COUNT: 3.42 10*12/L — ABNORMAL LOW (ref 4.50–5.90)
RED CELL DISTRIBUTION WIDTH: 16.4 % — ABNORMAL HIGH (ref 12.0–15.0)
WBC ADJUSTED: 3.3 10*9/L — ABNORMAL LOW (ref 4.5–11.0)

## 2017-12-28 LAB — CREATININE: Creatinine:MCnc:Pt:Ser/Plas:Qn:: 1.79 — ABNORMAL HIGH

## 2017-12-28 LAB — RED CELL DISTRIBUTION WIDTH: Lab: 16.4 — ABNORMAL HIGH

## 2017-12-28 LAB — INR: Lab: 1

## 2017-12-28 LAB — CYCLOSPORINE, TROUGH: Lab: 601 — ABNORMAL HIGH

## 2017-12-28 LAB — GAMMA GLUTAMYL TRANSFERASE: Gamma glutamyl transferase:CCnc:Pt:Ser/Plas:Qn:: 39

## 2017-12-28 LAB — PHOSPHORUS: Phosphate:MCnc:Pt:Ser/Plas:Qn:: 3.4

## 2017-12-28 MED ORDER — PREGABALIN 50 MG CAPSULE
ORAL_CAPSULE | Freq: Three times a day (TID) | ORAL | 0 refills | 0.00000 days | Status: CP
Start: 2017-12-28 — End: 2018-01-18
  Filled 2018-01-02: qty 90, 30d supply, fill #0

## 2017-12-28 NOTE — Unmapped (Addendum)
Patient in clinic with follow up to discuss MRI with Dr Matilde Haymaker.     Patient received flu shot, patient agreed to get Prevnar 13 and Shringrix at PCP.     Patient not able to walk and in rehab for his broken hip.    Dr. Matilde Haymaker to want a repeat MRI in 3 months.     Patient also started on Lyrica per PA [place] for neuropathy.

## 2017-12-28 NOTE — Unmapped (Signed)
TRANSPLANT SURGERY PROGRESS NOTE    Assessment and Plan  Jeffrey Ward is a 67 y.o. 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     He did break his hip 3 weeks ago, s/p surgery, now in therapy. He is otherwise clinically stable. Labs reveal no concerning abnormality- suspect fall in Hgb is related to recent surgery.    MRI from 09/2017 was reviewed with the patient with improvement in size of hepatic abscesses.    The patient was seen and evaluated with Dr. Matilde Haymaker due to medical complexity.     - Repeat MRI in 3 months   - Prescribe Lyrica for peripheral neuropathy (failed Gabapentin)- may need referral to orthopedic surgery and/ or neurology to evaluate if cause is related to osteoporosis and prior back fractures  - Continue medical management  - Follow-up with surgery in 6 months      Subjective  Jeffrey Ward is a 67 y.o. 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 Since last seen, he did fall and break his hip ~ 3 weeks ago, now s/p surgery with pin placement and now in therapy to regain ability to walk. He otherwise reports doing very well, tolerating PO with stable weight, off anti-coagulation for prior atrial fibrillation, doing well on all medications. He does complain still of peripheral neuropathy causing pain and burning of his feet. He denies fever, chills, CP, SOB, or GI complaint.       Objective    Vitals:    12/28/17 1253   BP: 146/74   Pulse: 71   Temp: 36.5 ??C (97.7 ??F)   TempSrc: Tympanic   Weight: 68.4 kg (150 lb 12.8 oz)   Height: 172.7 cm (5' 7.99)      Body mass index is 22.93 kg/m??.     Physical Exam:    General Appearance:   No acute distress, appears relatively well  Lungs:                Normal work of breathing on room air  Heart:                           Regular rate and rhythm  Abdomen:                Incision intact  Extremities:              No edema      Data Review:  All lab results last 24 hours:    Recent Results (from the past 24 hour(s))   PT-INR    Collection Time: 12/28/17 12:23 PM   Result Value Ref Range    PT 11.5 10.2 - 13.1 sec    INR 1.00    Gamma GT    Collection Time: 12/28/17 12:23 PM   Result Value Ref Range    GGT 39 12 - 109 U/L   Magnesium Level    Collection Time: 12/28/17 12:23 PM   Result Value Ref Range    Magnesium 1.8 1.6 - 2.2 mg/dL   Phosphorus Level    Collection Time: 12/28/17 12:23 PM   Result Value Ref Range  Phosphorus 3.4 2.9 - 4.7 mg/dL   Bilirubin, Direct    Collection Time: 12/28/17 12:23 PM   Result Value Ref Range    Bilirubin, Direct 0.40 0.00 - 0.40 mg/dL   Comprehensive Metabolic Panel    Collection Time: 12/28/17 12:23 PM   Result Value Ref Range    Sodium 134 (L) 135 - 145 mmol/L    Potassium 5.0 3.5 - 5.0 mmol/L    Chloride 103 98 - 107 mmol/L    CO2 22.0 22.0 - 30.0 mmol/L    BUN 41 (H) 7 - 21 mg/dL    Creatinine 1.61 (H) 0.70 - 1.30 mg/dL    BUN/Creatinine Ratio 23     EGFR CKD-EPI Non-African American, Male 38 (L) >=60 mL/min/1.64m2    EGFR CKD-EPI African American, Male 44 (L) >=60 mL/min/1.30m2    Glucose 124 (H) 65 - 99 mg/dL    Calcium 9.7 8.5 - 09.6 mg/dL    Albumin 3.7 3.5 - 5.0 g/dL    Total Protein 6.2 (L) 6.5 - 8.3 g/dL    Total Bilirubin 0.9 0.0 - 1.2 mg/dL    AST 15 (L) 19 - 55 U/L    ALT 21 19 - 72 U/L    Alkaline Phosphatase 141 (H) 38 - 126 U/L    Anion Gap 9 7 - 15 mmol/L   CBC w/ Differential    Collection Time: 12/28/17 12:23 PM   Result Value Ref Range    WBC 3.3 (L) 4.5 - 11.0 10*9/L    RBC 3.42 (L) 4.50 - 5.90 10*12/L    HGB 10.5 (L) 13.5 - 17.5 g/dL    HCT 04.5 (L) 40.9 - 53.0 %    MCV 93.5 80.0 - 100.0 fL MCH 30.9 26.0 - 34.0 pg    MCHC 33.0 31.0 - 37.0 g/dL    RDW 81.1 (H) 91.4 - 15.0 %    MPV 8.6 7.0 - 10.0 fL    Platelet 87 (L) 150 - 440 10*9/L    Neutrophils % 51.8 %    Lymphocytes % 37.4 %    Monocytes % 6.8 %    Eosinophils % 1.4 %    Basophils % 0.5 %    Absolute Neutrophils 1.7 (L) 2.0 - 7.5 10*9/L    Absolute Lymphocytes 1.3 (L) 1.5 - 5.0 10*9/L    Absolute Monocytes 0.2 0.2 - 0.8 10*9/L    Absolute Eosinophils 0.1 0.0 - 0.4 10*9/L    Absolute Basophils 0.0 0.0 - 0.1 10*9/L    Large Unstained Cells 2 0 - 4 %    Macrocytosis Slight (A) Not Present    Anisocytosis Slight (A) Not Present         Imaging: MRI personally reviewed with Dr. Matilde Haymaker

## 2017-12-28 NOTE — Unmapped (Signed)
Per provider, the patient received the Influenza vaccine.  Patient ID verified with name and date of birth.  All screening questions were answered.  Vaccine(s) were administered as ordered.  See immunization history for documentation.  Patient tolerated the injection(s) well with no issues noted.  Vaccine Information sheet given to the patient.

## 2018-01-01 ENCOUNTER — Ambulatory Visit
Admit: 2018-01-01 | Discharge: 2018-01-02 | Payer: MEDICARE | Attending: Student in an Organized Health Care Education/Training Program | Primary: Student in an Organized Health Care Education/Training Program

## 2018-01-01 ENCOUNTER — Ambulatory Visit
Admit: 2018-01-01 | Discharge: 2018-01-30 | Payer: MEDICARE | Attending: Speech-Language Pathologist | Primary: Speech-Language Pathologist

## 2018-01-01 DIAGNOSIS — J3800 Paralysis of vocal cords and larynx, unspecified: Secondary | ICD-10-CM

## 2018-01-01 DIAGNOSIS — R490 Dysphonia: Principal | ICD-10-CM

## 2018-01-01 NOTE — Unmapped (Signed)
Flexible endoscope serial number [id number] used today by Martina Sinner, MD

## 2018-01-01 NOTE — Unmapped (Signed)
Otolaryngology     Reason for visit:  Follow up     History of Present Illness:     Recall, initial presentation on 01/30/17 Mr.Lennex Haugen is a 67 y.o. 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 He spent 3 months in the hospital following his transplant he had respiratory compromise and ultimately had tracheostomy tube for much of that stay. He was decannulated in September before discharge home. 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  Patient reports he lost his voice following liver transplant episode 3 months ago. He states that since surgery his voice is breathy and weak. He cannot control his pitch. His voice symptoms do not fluctuate throughout the day and he has not noticed anything that makes it better or worse. He does have a cough throughout the day. He also endorses globus sensation. He denies dysphagia. He is maintained on an oral diet but has not had his G-tube removed yet due to surgeon preference. He has never had voice issues before and has never seen an ENT doctor for any related issues. He does endorse consistent shortness of breath with exertion and notes that this is likely secondary to his recovery process from his liver transplant. She does take Prilosec for his GERD. Thinks that this is stable. No coughing or choking while eating.    He was noted to have a right vocal fold paralysis with significant breathy voice and glottal insufficiency.   For multilpe reasons intervention was delayed.   He has had quite a complicated course.     He did have anemia and hemothorax.     He underwent in office injection cymetra on 05/31/17 0.40ml right fold.     Since then, he has had multiple set backs in his GI care. He had a fall and possible liver abscess. He underwent intubation and ERCP in April. He has been admitted to the hospital twice.     08/08/17: He returns today in follow-up.  He reports no significant improvement in the voice following injection.  He continues to be weak and breathy.  He is also had increasing coughing on foods and liquids.  No pneumonia.  He has had significant illnesses since his injection however as stated above.    He was taken back to [facility] on 09/13/17 for in office [facility] injection TTH cymetra 0.41ml.     10/03/17:   He returns after injection. The voice improved but by only about 50%. The voice has started to decline. He also complains of coughing while eating. This has not changed with injection augmentation. He does state he is louder and easier, but still weak from voice standpoint.    10.21.19:   Follows up for reassessment. Per wife, voice has remained stable since last visit. Improved compared to prior to injection but still with breathiness and vocal fatigue. He was unable to complete voice therpay. He had Hip fracture surgery recently and was intubated for this. Wife states still with some coughing after meals. No regurgitation after meals. Patient states imprpoved.       Past Medical History:  Past Medical History:   Diagnosis Date   ??? Atrial fibrillation (CMS-HCC)    ??? Basal cell carcinoma    ??? Cancer (CMS-HCC)    ??? Cirrhosis (CMS-HCC)    ???  Depression    ??? Diabetes (CMS-HCC)    ??? Headache    ??? Hepatitis C 07/17/2012   ??? History of transfusion    ??? Liver disease    ??? Low back pain 07/17/2012   ??? Varices, esophageal (CMS-HCC)        Past Surgical History:  Past Surgical History:   Procedure Laterality Date   ??? ANKLE SURGERY     ??? BACK SURGERY     ??? BACK SURGERY     ??? CARDIAC SURGERY      pacemaker   ??? CHG US GUIDE, TISSUE ABLATION N/A 01/22/2016    Procedure: ULTRASOUND GUIDANCE FOR, AND MONITORING OF, PARENCHYMAL TISSUE ABLATION;  Surgeon: Particia Nearing, MD;  Location: MAIN OR [facility];  Service: Transplant   ??? IR EMBOLIZATION ORGAN ISCHEMIA, TUMORS, INFAR  06/16/2016    IR EMBOLIZATION ORGAN ISCHEMIA, TUMORS, INFAR 06/16/2016 Ammie Dalton, MD IMG VIR H&V [facility]   ??? PR COLON CA SCRN NOT HI RSK IND N/A 02/27/2015    Procedure: COLOREC CNCR SCR;COLNSCPY NO;  Surgeon: Vonda Antigua, MD;  Location: GI PROCEDURES [facility]IAL [facility];  Service: Gastroenterology   ??? PR ENDOSCOPIC ULTRASOUND EXAM N/A 02/27/2015    Procedure: UGI ENDO; W/ENDO ULTRASOUND EXAM INCLUDES ESOPHAGUS, STOMACH, &/OR DUODENUM/JEJUNUM;  Surgeon: Vonda Antigua, MD;  Location: GI PROCEDURES MEMORIAL [facility];  Service: Gastroenterology   ??? PR ERCP BALLOON DILATE BILIARY/PANC DUCT/AMPULLA EA N/A 02/10/2017    Procedure: ERCP;WITH TRANS-ENDOSCOPIC BALLOON DILATION OF BILIARY/PANCREATIC DUCT(S) OR OF AMPULLA, INCLUDING SPHINCTERECTOMY, WHEN PERFOREMD,EACH DUCT ([id number]);  Surgeon: Mayford Knife, MD;  Location: GI PROCEDURES MEMOR[facility];  Service: Gastroenterology   ??? PR ERCP REMOVE FOREIGN BODY/STENT BILIARY/PANC DUCT N/A 02/10/2017    Procedure: ENDOSCOPIC RETROGRADE CHOLANGIOPANCREATOGRAPHY (ERCP); W/ REMOVAL OF FOREIGN BODY/STENT FROM BILIARY/PANCREATIC DUCT(S);  Surgeon: Mayford Knife, MD;  Location: GI PROCEDURES MEMORIAL [facility];  Service: Gastroenterology   ??? PR ERCP REMOVE FOREIGN BODY/STENT BILIARY/PANC DUCT N/A 06/29/2017    Procedure: ENDOSCOPIC RETROGRADE CHOLANGIOPANCREATOGRAPHY (ERCP); W/ REMOVAL OF FOREIGN BODY/STENT FROM BILIARY/PANCREATIC DUCT(S);  Surgeon: Vonda Antigua, MD;  Location: GI PROCEDURES MEMORIAL [facility];  Service: Gastroenterology   ??? PR ERCP STENT PLACEMENT BILIARY/PANCREATIC DUCT N/A 11/29/2016    Procedure: ENDOSCOPIC RETROGRADE CHOLANGIOPANCREATOGRAPHY (ERCP); WITH PLACEMENT OF ENDOSCOPIC STENT INTO BILIARY OR PANCREATIC DUCT;  Surgeon: Chriss Driver, MD;  Location: GI PROCEDURES [facility]ORIAL [facility];  Service: Gastroenterology   ??? PR ERCP STENT PLACEMENT BILIARY/PANCREATIC DUCT N/A 04/26/2017    Procedure: ENDOSCOPIC RETROGRADE CHOLANGIOPANCREATOGRAPHY (ERCP); WITH PLACEMENT OF ENDOSCOPIC STENT INTO BILIARY OR PANCREATIC DUCT;  Surgeon: Mayford Knife, MD;  Location: GI PROCEDURES MEMORIAL [facility];  Service: Gastroenterology   ??? PR ERCP,W/REMOVAL STONE,BIL/PANCR DUCTS N/A 11/29/2016    Procedure: ERCP; W/ENDOSCOPIC RETROGRADE REMOVAL OF CALCULUS/CALCULI FROM BILIARY &/OR PANCREATIC DUCTS;  Surgeon: Chriss Driver, MD;  Location: GI PROCEDURES [facility]OR[facility];  Service: Gastroenterology   ??? PR ERCP,W/REMOVAL STONE,BIL/PANCR DUCTS N/A 02/10/2017    Procedure: ERCP; W/ENDOSCOPIC RETROGRADE REMOVAL OF CALCULUS/CALCULI FROM BILIARY &/OR PANCREATIC DUCTS;  Surgeon: Mayford Knife, MD;  Location: GI PROCEDURES [facility];  Service: Gastroenterology   ??? PR ERCP,W/REMOVAL STONE,BIL/PANCR DUCTS N/A 04/26/2017    Procedure: ERCP; W/ENDOSCOPIC RETROGRADE REMOVAL OF CALCULUS/CALCULI FROM BILIARY &/OR PANCREATIC DUCTS;  Surgeon: Mayford Knife, MD;  Location: GI PROCEDURES MEMORIAL [facility];  Service: Gastroenterology   ??? PR ERCP,W/REMOVAL STONE,BIL/PANCR DUCTS N/A 06/29/2017    Procedure: ERCP; W/ENDOSCOPIC RETROGRADE REMOVAL OF CALCULUS/CALCULI FROM BILIARY &/OR PANCREATIC DUCTS;  Surgeon: Tawanna Cooler  Gevena Barre, MD;  Location: GI PROCEDURES MEMORIAL [facility];  Service: Gastroenterology   ??? PR INSER HEART TEMP PACER ONE CHMBR N/A 10/02/2016    Procedure: Tempoarary Pacemaker Insertion;  Surgeon: Meredith Leeds, MD;  Location: [facility];  Service: Cardiology   ??? PR LAP,DIAGNOSTIC ABDOMEN N/A 01/22/2016    Procedure: Laparoscopy, Abdomen, Peritoneum, & Omentum, Diagnostic, W/Wo Collection Specimen(S) By Brushing Or Washing;  Surgeon: Particia Nearing, MD;  Location: MAIN OR [facility];  Service: Transplant   ??? PR PLACE PERCUT GASTROSTOMY TUBE N/A 11/17/2016    Procedure: UGI ENDO; W/DIRECTED PLCMT PERQ GASTROSTOMY TUBE;  Surgeon: Cletis Athens, MD;  Location: GI PROCEDURES MEMORIAL [facility];  Service: Gastroenterology   ??? PR TRACHEOSTOMY, PLANNED N/A 09/29/2016    Procedure: TRACHEOSTOMY PLANNED (SEPART PROC); Surgeon: Katherina Mires, MD;  Location: MAIN OR [facility];  Service: Trauma   ??? PR TRANSCATH INSERT OR REPLACE LEADLESS PM VENTR N/A 10/11/2016    Procedure: Pacemaker Implant/Replace Leadless;  Surgeon: Meredith Leeds, MD;  Location: [facility];  Service: Cardiology   ??? PR TRANSPLANT LIVER,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    Procedure: Liver Allotransplantation; Orthotopic, Partial Or Whole, From Cadaver Or Living Donor, Any Age;  Surgeon: Doyce Loose, MD;  Location: MAIN OR [facility];  Service: Transplant   ??? PR TRANSPLANT,PREP DONOR LIVER, WHOLE N/A 09/15/2016    Procedure: Rogelia Boga Std Prep Cad Donor Whole Liver Gft Prior Tnsplnt,Inc Chole,Diss/Rem Surr Tissu Wo Triseg/Lobe Splt;  Surgeon: Doyce Loose, MD;  Location: MAIN OR [facility];  Service: Transplant   ??? PR UPPER GI ENDOSCOPY,BIOPSY N/A 07/17/2012    Procedure: UGI ENDOSCOPY; WITH BIOPSY, SINGLE OR MULTIPLE;  Surgeon: Alba Destine, MD;  Location: GI PROCEDURES MEMORIAL [facility];  Service: Gastroenterology   ??? PR UPPER GI ENDOSCOPY,DIAGNOSIS N/A 02/04/2014    Procedure: UGI ENDO, INCLUDE ESOPHAGUS, STOMACH, & DUODENUM &/OR JEJUNUM; DX W/WO COLLECTION SPECIMN, BY BRUSH OR WASH;  Surgeon: Wilburt Finlay, MD;  Location: GI PROCEDURES MEMORIAL [facility];  Service: Gastroenterology   ??? PR UPPER GI ENDOSCOPY,LIGAT VARIX N/A 11/05/2013    Procedure: UGI ENDO; W/BAND LIG ESOPH &/OR GASTRIC VARICES;  Surgeon: Wilburt Finlay, MD;  Location: GI PROCEDURES MEMOR[facility];  Service: Gastroenterology       Medications:    Current Outpatient Medications:   ???  acetaminophen (TYLENOL) 325 MG tablet, Take 1 tablet (325 mg total) by mouth every four (4) hours as needed for pain., Disp: 100 tablet, Rfl: 2  ???  alendronate (FOSAMAX) 70 MG tablet, , Disp: , Rfl:   ???  bisacodyl (DULCOLAX, BISACODYL,) 5 mg EC tablet, Take 5 mg by mouth., Disp: , Rfl:   ???  blood sugar diagnostic Strp, Use three (3) times a day before meals., Disp: 100 each, Rfl: 7  ???  blood-glucose meter (GLUCOSE MONITORING KIT) kit, Use as instructed, Disp: 1 each, Rfl: 0  ???  calcium carbonate (OS-CAL) 500 mg calcium (1,250 mg) chewable tablet, Chew., Disp: , Rfl:   ???  docusate sodium (COLACE) 100 MG capsule, TAKE 1 CAPSULE BY MOUTH TWICE DAILY, Disp: 60 capsule, Rfl: 0  ???  ferrous sulfate 325 (65 FE) MG tablet, Take 325 mg by mouth., Disp: , Rfl:   ???  HYDROcodone-acetaminophen (NORCO) 5-325 mg per tablet, Take 1 tablet by mouth every 6 (six) hours as needed for Pain, Disp: 30 tablet, Rfl: 0  ???  HYDROcodone-acetaminophen (NORCO) 5-325 mg per tablet, Take 1 tablet by mouth., Disp: , Rfl:   ???  insulin  ASPART (NOVOLOG FLEXPEN U-100 INSULIN) 100 unit/mL (3 mL) injection pen, Inject 6 Units under the skin., Disp: , Rfl:   ???  insulin glargine (BASAGLAR, LANTUS) 100 unit/mL (3 mL) injection pen, INJECT 12 UNITS SUBCUTANEOUSLY (UNDER SKIN) NIGHTLY, Disp: , Rfl:   ???  magnesium oxide-Mg AA chelate (MAGNESIUM, AMINO ACID CHELATE,) 133 mg Tab, Take by mouth., Disp: , Rfl:   ???  melatonin 3 mg Tab, Take by mouth., Disp: , Rfl:   ???  mirtazapine (REMERON) 15 MG tablet, Take 15 mg by mouth., Disp: , Rfl:   ???  mycophenolate (CELLCEPT) 250 mg capsule, 500 mg., Disp: , Rfl:   ???  NEORAL 100 mg capsule, TAKE 1 CAPSULE BY MOUTH TWICE DAILY WITH 3 OF THE 25MG  CAPSULES, Disp: 60 each, Rfl: 11  ???  NEORAL 25 mg capsule, TAKE 3 CAPSULES BY MOUTH TWICE DAILY WITH 100MG  CAPSULE, Disp: 180 each, Rfl: 11  ???  nitrofurantoin, macrocrystal-monohydrate, (MACROBID) 100 MG capsule, Take 1 capsule (100 mg total) by mouth twice daily until gone., Disp: 13 capsule, Rfl: 0  ???  pen needle, diabetic 32 gauge x 5/32 Ndle, USE WITH INSULIN PEN 4 TIMES DAILY AS DIRECTED, Disp: 100 each, Rfl: 1  ???  polyethylene glycol (GLYCOLAX) 17 gram/dose powder, MIX 17 GRAMS (USE MEASURE LINE IN CAP) IN 4-8 OUNCES OF WATER, JUICE, SODA, COFFEE, OR TEA AND DRINK ONCE DAILY, Disp: 527 g, Rfl: 0  ???  pregabalin (LYRICA) 50 MG capsule, Take 1 capsule (50 mg total) by mouth Three (3) times a day., Disp: 90 capsule, Rfl: 0  ???  sildenafil, antihypertensive, (REVATIO) 20 mg tablet, Take 40 mg by mouth Three (3) times a day., Disp: , Rfl:   ???  sildenafil, antihypertensive, (REVATIO) 20 mg tablet, Take 40 mg by mouth., Disp: , Rfl:   ???  alendronate (FOSAMAX) 70 MG tablet, Take 1 tablet (70 mg total) by mouth every seven (7) days. (Patient not taking: Reported on 01/01/2018), Disp: 4 tablet, Rfl: 11  ???  blood-glucose meter Misc, USE AS DIRECTED TO CHECK BLOOD SUGAR, Disp: 1 each, Rfl: 0  ???  cycloSPORINE modified (NEORAL) 100 MG capsule, Take 100 mg by mouth., Disp: , Rfl:   ???  insulin ASPART (NOVOLOG FLEXPEN U-100 INSULIN) 100 unit/mL injection pen, Inject 0.06 mL (6 Units total) under the skin Three (3) times a day before meals. (Patient not taking: Reported on 01/01/2018), Disp: 4 mL, Rfl: 11  ???  insulin glargine (LANTUS) 100 unit/mL (3 mL) injection pen, INJECT 12 UNITS SUBCUTANEOUSLY (UNDER SKIN) NIGHTLY, Disp: 15 mL, Rfl: 10  ???  lancing device Misc, USE AS DIRECTED TO CHECK BLOOD SUGAR, Disp: 1 each, Rfl: 0  ???  magnesium oxide-Mg AA chelate (MAGNESIUM, AMINO ACID CHELATE,) 133 mg Tab, Take 2 tablets by mouth Two (2) times a day. (Patient not taking: Reported on 01/01/2018), Disp: 100 tablet, Rfl: PRN  ???  melatonin 3 mg Tab, Take 1 tablet (3 mg total) by mouth nightly. (Patient not taking: Reported on 01/01/2018), Disp: 30 tablet, Rfl: 0  ???  mirtazapine (REMERON) 15 MG tablet, TAKE 1 TABLET BY MOUTH NIGHTLY (Patient not taking: Reported on 01/01/2018), Disp: 90 tablet, Rfl: 3  ???  mycophenolate (CELLCEPT) 250 mg capsule, TAKE 2 CAPSULES (500MG ) BY MOUTH TWICE DAILY (Patient not taking: Reported on 01/01/2018), Disp: 120 capsule, Rfl: 7  ???  sildenafil, antihypertensive, (REVATIO) 20 mg tablet, TAKE 2 TABLETS (40 MG TOTAL) BY G-TUBE THREE TIMES DAILY (Patient not taking: Reported on  01/01/2018), Disp: 180 tablet, Rfl: 6     Allergies:  No Known Allergies    Family History:  Family History Problem Relation Age of Onset   ??? Hypertension Mother    ??? Cirrhosis Neg Hx    ??? Liver cancer Neg Hx    ??? Anemia Neg Hx    ??? Cancer Neg Hx    ??? Diabetes Neg Hx    ??? Kidney disease Neg Hx    ??? Obesity Neg Hx    ??? Thyroid disease Neg Hx    ??? Osteoporosis Neg Hx    ??? Coronary artery disease Neg Hx    ??? Anesthesia problems Neg Hx    ??? Basal cell carcinoma Neg Hx    ??? Squamous cell carcinoma Neg Hx      Social History:  Social History     Tobacco Use   ??? Smoking status: Never Smoker   ??? Smokeless tobacco: Never Used   ??? Tobacco comment: Smoked in high school for about 5 years.    Substance Use Topics   ??? Alcohol use: No   ??? Drug use: No       Review of Systems:  Denies chest pain, sob, fevers, n/v, abd pain      Physical Exam:   Constitutional:  Vitals reviewed in chart, patient has normal appearance. Well nourished, well-developed, no acute distress  Voice:  Hoarse, breathy, weak voice  Respiration:  Breathing comfortably, no stridor.  CV: No clubbing/cyanosis/edema in hands.   Eyes:  extraocular motion intact, sclera normal.   Neuro:  Alert and oriented times 3, Cranial nerves 2-12 intact and symmetric bilaterally. Except X.  Head and Face:  Skin with no masses or lesions, sinuses nontender to palpation, facial nerve fully intact.   Salivary Glands:  Parotid and submandibular glands normal bilaterally.   Ears:  Normal external ears  Nose:  External nose midline, anterior rhinoscopy is normal with limited visualization just to the anterior interior turbinate.   Oral Cavity/Oropharynx/Lips:  Normal mucous membranes, normal floor of mouth/tongue/oropharynx, no masses or lesions are noted.  Good dentition  Pharyngeal Walls:  No masses noted.  Neck/Lymph:  No lymphadenopathy, no thyroid masses. Previous tracheostoma still patent with moderate granulation tissue filling the fistula. No erythema or drainage or purulence noted.   Larynx: Mirror evaluation insufficient to visualize secondary to prominent gag        Procedure Note    Endoscopy Type:  Flexible Fiberoptic Videostroboscopy    Indications/TimeOut:  To better evaluate the patient???s symptoms, fiberoptic videostroboscopy is indicated.   A time out identifying the patient, the procedure, the location of the procedure and any concerns was performed prior to beginning the procedure.    Procedure Details:    The patient was placed in the sitting position.  After topical anesthesia and decongestion with oxymetazoline and 1% lidocaine, the flexible laryngoscope was passed.  The microphone was used to trigger the xenon stroboscopic light source.    -  Nasal cavity  - There was no pus or polyps noted in the nasal cavity. Septum intact  -  Nasopharynx - there was no evidence of mass or lesion.  -  Hypopharynx - There were no lesions in the pyriformis, epiglottis, or base of tongue.  -  Larynx -  there was mild interarytenoid edema, no erythema.  -  Vocal Folds -     Mobility: Right vocal fold with some recovery of motion and hypomobile   Amplitude: symmetric  Mucosal Wave: symmetric   Closure: mid cord gap   Lesions/Other findings: Evidence of mild continued augmentation to the right vocal fold; MTD much improved    Condition:  Stable.  Patient tolerated procedure well.    Complications:  None    FEES 7.23.19: Normal oropharyngeal swallow.  No coughing choking regurgitation or backflow.    VRQOL: 40, (previously 20  GFI: 1 (previously 17)  EAT 10: 13 (previously 19)    MBS: 08/29/17:   Assessment: Pt presents with a mild pharyngeal dysphagia c/b reduced BOT retraction which led to mild vallecula residue (all consistencies). Trace post-cricoid residue of solids. Trace penetration x 1 of thin liquids before swallows (cleared). No aspiration observed. Cervical esophagus: mild accumulation of boluses at tissue protrusion (~C6) c/w the appearance of a small Zenker's diverticulum. Occasional mild backflow of boluses from this area.     Assessment:   Mr.Osamu Kuenzel is a 67 y.o. 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  He  has a past medical history of Atrial fibrillation (CMS-HCC), Basal cell carcinoma, Cancer (CMS-HCC), Cirrhosis (CMS-HCC), Depression, Diabetes (CMS-HCC), Headache, Hepatitis C (07/17/2012), History of transfusion, Liver disease, Low back pain (07/17/2012), and Varices, esophageal (CMS-HCC).    Findings:   dypsphonia  right vocal fold immobility   Glottic insufficiency   --all temporally related to liver transplant transplant. (prolonged intubation, trach possible etiologies)   05/31/17 Status post in office injection augmentation with Cymetra in the [facility]   Significantly complicated history surrounding injection including hemothorax and anemia prior to injection followed by readmission x2 for fall rib pain and intubation for ERCP.  S/p [facility] on 09/13/17 for in office [facility] injection TTH cymetra 0.79ml.     Plan:  Injection now resorbing. Interested in more permanent procedure. Discussed options including fat injection vs CAHA vs goretex medialization thyroplasty. He no longer is on coumadin. Regarding his dysphagia and small Zenker's - his EAT 10 score is less than previous.  He would like to continue swallowing strategies for now without any interventions.    Therefore, will proceed with awake goretex medialization thyroplasty.     He does have Afib with pacemaker - on ASA; has insulin-dep DM; immunosuppresion --> needs pre-op anesthesia eval and cardiac clearance

## 2018-01-02 MED FILL — PREGABALIN 50 MG CAPSULE: 30 days supply | Qty: 90 | Fill #0 | Status: AC

## 2018-01-02 NOTE — Unmapped (Signed)
[facility] SPEECH [address]  OUTPATIENT SPEECH PATHOLOGY  01/01/2018      Patient Name: Jeffrey Ward  Date of Birth:[birth year]  Session Number: 1  Diagnosis:   Encounter Diagnosis   Name Primary?   ??? Dysphonia         Date of Evaluation: 01/01/18  Date of Symptom Onset: 09/15/16  Referred by: Martina Sinner, MD   Reason for Referral: Evaluation Speech, Language, Voice, Cognition                 Surgery date (if applicable): 09/15/16       Chief Complaint: Dysphonia      ASSESSMENT:  Severe Dysphonia    Characterized by: tight, pitch-locked, hoarseness, breathiness, hard glottal attacks, phonation breaks, pharyngeal focus, weak voice, strained voice, roughness  Therapy Techniques utilized during trial/diagnostic therapy:: Increased hard onsets  Comments: Patient to undergo additional medialization surgery and will follow up with SLP postoperatively    Stimulability: Pt was somewhat stimulable     Treatment Recommendations: N/A            Prognosis:  Good    Negative Prognosis Rationale: Time post onset, Severity of deficits       Positive Prognosis Rationale: Motivation, Good caregiver/family support      Goals:  Patient and Family Goals: To be able to speak loudly       SUBJECTIVE:    Jeffrey Ward is a 67 year old male who initially presented to this clinic on 01/30/17 with a history of DM with associated peripheral neuropathy, heart failure, afib, 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  Patient had a 38-month hospital stay after his liver transplant with respiratory compromise that eventually required tracheostomy tube patient states that he has not had his baseline voice since that time. He was noted to have a right vocal fold paralysis with significant breathy voice and glottal insufficiency.  He underwent in office injection cymetra on 05/31/17 0.35ml right fold.  After that point, per Dr. Margaretmary Eddy note, he had multiple set backs in his GI care. He had a fall and possible liver abscess. He underwent intubation and ERCP in April. He has been admitted to the hospital twice.  On September 13, 2017 he underwent  in office MPR injection TTH cymetra 0.36ml.  Most recently, patient had to undergo hip fracture surgery after a fall.  Patient and family endorsed continued to dysphasia and coughing following meals.    ??  Communication Preference: Verbal, Written, Visual         Barriers to Learning: No Barriers                                                     Prior treatment for referral reason: Yes   Comments: 1 session but had to stop for personal and health reasons              Pain?: Yes   Comments: 7/10 hip and back pain  Precautions: Falls         Prior Function: Independent                                   Lives With: Spouse  Past Medical History:   Diagnosis Date   ??? Atrial fibrillation (CMS-HCC)    ??? Basal cell carcinoma    ??? Cancer (CMS-HCC)    ??? Cirrhosis (CMS-HCC)    ??? Depression    ??? Diabetes (CMS-HCC)    ??? Headache    ??? Hepatitis C 07/17/2012   ??? History of transfusion    ??? Liver disease    ??? Low back pain 07/17/2012   ??? Varices, esophageal (CMS-HCC)       Family History   Problem Relation Age of Onset   ??? Hypertension Mother    ??? Cirrhosis Neg Hx    ??? Liver cancer Neg Hx    ??? Anemia Neg Hx    ??? Cancer Neg Hx    ??? Diabetes Neg Hx    ??? Kidney disease Neg Hx    ??? Obesity Neg Hx    ??? Thyroid disease Neg Hx    ??? Osteoporosis Neg Hx    ??? Coronary artery disease Neg Hx    ??? Anesthesia problems Neg Hx    ??? Basal cell carcinoma Neg Hx    ??? Squamous cell carcinoma Neg Hx      Past Surgical History:   Procedure Laterality Date   ??? ANKLE SURGERY     ??? BACK SURGERY     ??? BACK SURGERY     ??? CARDIAC SURGERY      pacemaker   ??? CHG US GUIDE, TISSUE ABLATION N/A 01/22/2016    Procedure: ULTRASOUND GUIDANCE FOR, AND MONITORING OF, PARENCHYMAL TISSUE ABLATION;  Surgeon: Particia Nearing, MD;  Location: MAIN OR [facility];  Service: Transplant   ??? IR EMBOLIZATION ORGAN ISCHEMIA, TUMORS, INFAR  06/16/2016    IR EMBOLIZATION ORGAN ISCHEMIA, TUMORS, INFAR 06/16/2016 Ammie Dalton, MD IMG VIR H&[facility]   ??? PR COLON CA SCRN NOT HI RSK IND N/A 02/27/2015    Procedure: COLOREC CNCR SCR;COLNSCPY NO;  Surgeon: Vonda Antigua, MD;  Location: GI PROCEDURES [facility]ORIAL [facility];  Service: Gastroenterology   ??? PR ENDOSCOPIC ULTRASOUND EXAM N/A 02/27/2015    Procedure: UGI ENDO; W/ENDO ULTRASOUND EXAM INCLUDES ESOPHAGUS, STOMACH, &/OR DUODENUM/JEJUNUM;  Surgeon: Vonda Antigua, MD;  Location: GI PROCEDURES MEMORIAL [facility];  Service: Gastroenterology   ??? PR ERCP BALLOON DILATE BILIARY/PANC DUCT/AMPULLA EA N/A 02/10/2017    Procedure: ERCP;WITH TRANS-ENDOSCOPIC BALLOON DILATION OF BILIARY/PANCREATIC DUCT(S) OR OF AMPULLA, INCLUDING SPHINCTERECTOMY, WHEN PERFOREMD,EACH DUCT (13086);  Surgeon: Mayford Knife, MD;  Location: GI PROCEDURES [facility]ORIAL [facility];  Service: Gastroenterology   ??? PR ERCP REMOVE FOREIGN BODY/STENT BILIARY/PANC DUCT N/A 02/10/2017    Procedure: ENDOSCOPIC RETROGRADE CHOLANGIOPANCREATOGRAPHY (ERCP); W/ REMOVAL OF FOREIGN BODY/STENT FROM BILIARY/PANCREATIC DUCT(S);  Surgeon: Mayford Knife, MD;  Location: GI PROCEDURES [facility];  Service: Gastroenterology   ??? PR ERCP REMOVE FOREIGN BODY/STENT BILIARY/PANC DUCT N/A 06/29/2017    Procedure: ENDOSCOPIC RETROGRADE CHOLANGIOPANCREATOGRAPHY (ERCP); W/ REMOVAL OF FOREIGN BODY/STENT FROM BILIARY/PANCREATIC DUCT(S);  Surgeon: Vonda Antigua, MD;  Location: GI PROCEDURES [facility]ORIAL [facility];  Service: Gastroenterology   ??? PR ERCP STENT PLACEMENT BILIARY/PANCREATIC DUCT N/A 11/29/2016    Procedure: ENDOSCOPIC RETROGRADE CHOLANGIOPANCREATOGRAPHY (ERCP); WITH PLACEMENT OF ENDOSCOPIC STENT INTO BILIARY OR PANCREATIC DUCT;  Surgeon: Chriss Driver, MD;  Location: GI PROCEDURES [facility];  Service: Gastroenterology   ??? PR ERCP STENT PLACEMENT BILIARY/PANCREATIC DUCT N/A 04/26/2017    Procedure: ENDOSCOPIC RETROGRADE CHOLANGIOPANCREATOGRAPHY (ERCP); WITH PLACEMENT OF ENDOSCOPIC STENT INTO BILIARY OR PANCREATIC DUCT;  Surgeon: Mayford Knife, MD;  Location:  GI PROCEDURES MEMORIAL [facility];  Service: Gastroenterology   ??? PR ERCP,W/REMOVAL STONE,BIL/PANCR DUCTS N/A 11/29/2016    Procedure: ERCP; W/ENDOSCOPIC RETROGRADE REMOVAL OF CALCULUS/CALCULI FROM BILIARY &/OR PANCREATIC DUCTS;  Surgeon: Chriss Driver, MD;  Location: GI PROCEDURES [facility]OR[facility];  Service: Gastroenterology   ??? PR ERCP,W/REMOVAL STONE,BIL/PANCR DUCTS N/A 02/10/2017    Procedure: ERCP; W/ENDOSCOPIC RETROGRADE REMOVAL OF CALCULUS/CALCULI FROM BILIARY &/OR PANCREATIC DUCTS;  Surgeon: Mayford Knife, MD;  Location: GI PROCEDURES MEMORIAL [facility];  Service: Gastroenterology   ??? PR ERCP,W/REMOVAL STONE,BIL/PANCR DUCTS N/A 04/26/2017    Procedure: ERCP; W/ENDOSCOPIC RETROGRADE REMOVAL OF CALCULUS/CALCULI FROM BILIARY &/OR PANCREATIC DUCTS;  Surgeon: Mayford Knife, MD;  Location: GI PROCEDURES MEMORIAL [facility];  Service: Gastroenterology   ??? PR ERCP,W/REMOVAL STONE,BIL/PANCR DUCTS N/A 06/29/2017    Procedure: ERCP; W/ENDOSCOPIC RETROGRADE REMOVAL OF CALCULUS/CALCULI FROM BILIARY &/OR PANCREATIC DUCTS;  Surgeon: Vonda Antigua, MD;  Location: GI PROCEDURES [facility]IAL [facility];  Service: Gastroenterology   ??? PR INSER HEART TEMP PACER ONE CHMBR N/A 10/02/2016    Procedure: Tempoarary Pacemaker Insertion;  Surgeon: Meredith Leeds, MD;  Location: [facility];  Service: Cardiology   ??? PR LAP,DIAGNOSTIC ABDOMEN N/A 01/22/2016    Procedure: Laparoscopy, Abdomen, Peritoneum, & Omentum, Diagnostic, W/Wo Collection Specimen(S) By Brushing Or Washing;  Surgeon: Particia Nearing, MD;  Location: MAIN OR [facility];  Service: Transplant   ??? PR PLACE PERCUT GASTROSTOMY TUBE N/A 11/17/2016    Procedure: UGI ENDO; W/DIRECTED PLCMT PERQ GASTROSTOMY TUBE;  Surgeon: Cletis Athens, MD;  Location: GI PROCEDURES MEMORIAL [facility];  Service: Gastroenterology   ??? PR TRACHEOSTOMY, PLANNED N/A 09/29/2016    Procedure: TRACHEOSTOMY PLANNED (SEPART PROC);  Surgeon: Katherina Mires, MD;  Location: MAIN OR [facility];  Service: Trauma   ??? PR TRANSCATH INSERT OR REPLACE LEADLESS PM VENTR N/A 10/11/2016    Procedure: Pacemaker Implant/Replace Leadless;  Surgeon: Meredith Leeds, MD;  Location: [facility];  Service: Cardiology   ??? PR TRANSPLANT LIVER,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    Procedure: Liver Allotransplantation; Orthotopic, Partial Or Whole, From Cadaver Or Living Donor, Any Age;  Surgeon: Doyce Loose, MD;  Location: [facility]IN OR [facility];  Service: Transplant   ??? PR TRANSPLANT,PREP DONOR LIVER, WHOLE N/A 09/15/2016    Procedure: Rogelia Boga Std Prep Cad Donor Whole Liver Gft Prior Tnsplnt,Inc Chole,Diss/Rem Surr Tissu Wo Triseg/Lobe Splt;  Surgeon: Doyce Loose, MD;  Location: MAIN OR [facility];  Service: Transplant   ??? PR UPPER GI ENDOSCOPY,BIOPSY N/A 07/17/2012    Procedure: UGI ENDOSCOPY; WITH BIOPSY, SINGLE OR MULTIPLE;  Surgeon: Alba Destine, MD;  Location: GI PROCEDURES MEMORIAL [facility];  Service: Gastroenterology   ??? PR UPPER GI ENDOSCOPY,DIAGNOSIS N/A 02/04/2014    Procedure: UGI ENDO, INCLUDE ESOPHAGUS, STOMACH, & DUODENUM &/OR JEJUNUM; DX W/WO COLLECTION SPECIMN, BY BRUSH OR WASH;  Surgeon: Wilburt Finlay, MD;  Location: GI PROCEDURES [facility]ORIAL [facility];  Service: Gastroenterology   ??? PR UPPER GI ENDOSCOPY,LIGAT VARIX N/A 11/05/2013    Procedure: UGI ENDO; W/BAND LIG ESOPH &/OR GASTRIC VARICES;  Surgeon: Wilburt Finlay, MD;  Location: GI PROCEDURES MEMORIAL [facility];  Service: Gastroenterology    No Known Allergies  Social History     Tobacco Use   ??? Smoking status: Never Smoker   ??? Smokeless tobacco: Never Used   ??? Tobacco comment: Smoked in high school for about 5 years.    Substance Use Topics   ??? Alcohol use: No      Current Outpatient Medications   Medication Sig Dispense  Refill   ??? acetaminophen (TYLENOL) 325 MG tablet Take 1 tablet (325 mg total) by mouth every four (4) hours as needed for pain. 100 tablet 2   ??? alendronate (FOSAMAX) 70 MG tablet Take 1 tablet (70 mg total) by mouth every seven (7) days. (Patient not taking: Reported on 01/01/2018) 4 tablet 11   ??? alendronate (FOSAMAX) 70 MG tablet      ??? bisacodyl (DULCOLAX, BISACODYL,) 5 mg EC tablet Take 5 mg by mouth.     ??? blood sugar diagnostic Strp Use three (3) times a day before meals. 100 each 7   ??? blood-glucose meter (GLUCOSE MONITORING KIT) kit Use as instructed 1 each 0   ??? blood-glucose meter Misc USE AS DIRECTED TO CHECK BLOOD SUGAR 1 each 0   ??? calcium carbonate (OS-CAL) 500 mg calcium (1,250 mg) chewable tablet Chew.     ??? cycloSPORINE modified (NEORAL) 100 MG capsule Take 100 mg by mouth.     ??? docusate sodium (COLACE) 100 MG capsule TAKE 1 CAPSULE BY MOUTH TWICE DAILY 60 capsule 0   ??? ferrous sulfate 325 (65 FE) MG tablet Take 325 mg by mouth.     ??? HYDROcodone-acetaminophen (NORCO) 5-325 mg per tablet Take 1 tablet by mouth every 6 (six) hours as needed for Pain 30 tablet 0   ??? HYDROcodone-acetaminophen (NORCO) 5-325 mg per tablet Take 1 tablet by mouth.     ??? insulin ASPART (NOVOLOG FLEXPEN U-100 INSULIN) 100 unit/mL (3 mL) injection pen Inject 6 Units under the skin.     ??? insulin ASPART (NOVOLOG FLEXPEN U-100 INSULIN) 100 unit/mL injection pen Inject 0.06 mL (6 Units total) under the skin Three (3) times a day before meals. (Patient not taking: Reported on 01/01/2018) 4 mL 11   ??? insulin glargine (BASAGLAR, LANTUS) 100 unit/mL (3 mL) injection pen INJECT 12 UNITS SUBCUTANEOUSLY (UNDER SKIN) NIGHTLY     ??? insulin glargine (LANTUS) 100 unit/mL (3 mL) injection pen INJECT 12 UNITS SUBCUTANEOUSLY (UNDER SKIN) NIGHTLY 15 mL 10   ??? lancing device Misc USE AS DIRECTED TO CHECK BLOOD SUGAR 1 each 0   ??? magnesium oxide-Mg AA chelate (MAGNESIUM, AMINO ACID CHELATE,) 133 mg Tab Take 2 tablets by mouth Two (2) times a day. (Patient not taking: Reported on 01/01/2018) 100 tablet PRN   ??? magnesium oxide-Mg AA chelate (MAGNESIUM, AMINO ACID CHELATE,) 133 mg Tab Take by mouth.     ??? melatonin 3 mg Tab Take 1 tablet (3 mg total) by mouth nightly. (Patient not taking: Reported on 01/01/2018) 30 tablet 0   ??? melatonin 3 mg Tab Take by mouth.     ??? mirtazapine (REMERON) 15 MG tablet TAKE 1 TABLET BY MOUTH NIGHTLY (Patient not taking: Reported on 01/01/2018) 90 tablet 3   ??? mirtazapine (REMERON) 15 MG tablet Take 15 mg by mouth.     ??? mycophenolate (CELLCEPT) 250 mg capsule TAKE 2 CAPSULES (500MG ) BY MOUTH TWICE DAILY (Patient not taking: Reported on 01/01/2018) 120 capsule 7   ??? mycophenolate (CELLCEPT) 250 mg capsule 500 mg.     ??? NEORAL 100 mg capsule TAKE 1 CAPSULE BY MOUTH TWICE DAILY WITH 3 OF THE 25MG  CAPSULES 60 each 11   ??? NEORAL 25 mg capsule TAKE 3 CAPSULES BY MOUTH TWICE DAILY WITH 100MG  CAPSULE 180 each 11   ??? nitrofurantoin, macrocrystal-monohydrate, (MACROBID) 100 MG capsule Take 1 capsule (100 mg total) by mouth twice daily until gone. 13 capsule 0   ??? pen needle,  diabetic 32 gauge x 5/32 Ndle USE WITH INSULIN PEN 4 TIMES DAILY AS DIRECTED 100 each 1   ??? polyethylene glycol (GLYCOLAX) 17 gram/dose powder MIX 17 GRAMS (USE MEASURE LINE IN CAP) IN 4-8 OUNCES OF WATER, JUICE, SODA, COFFEE, OR TEA AND DRINK ONCE DAILY 527 g 0   ??? pregabalin (LYRICA) 50 MG capsule Take 1 capsule (50 mg total) by mouth Three (3) times a day. 90 capsule 0   ??? sildenafil, antihypertensive, (REVATIO) 20 mg tablet Take 40 mg by mouth Three (3) times a day.     ??? sildenafil, antihypertensive, (REVATIO) 20 mg tablet TAKE 2 TABLETS (40 MG TOTAL) BY G-TUBE THREE TIMES DAILY (Patient not taking: Reported on 01/01/2018) 180 tablet 6   ??? sildenafil, antihypertensive, (REVATIO) 20 mg tablet Take 40 mg by mouth.       No current facility-administered medications for this visit.          OBJECTIVE  Voice - General   Hydration: 5 bottles of water per day  Caffeine Use: 1 coffee a day  Onset: Sudden  Daily Voice Use: Moderate demand   Acoustic Measures  Connected Sample: CAPE-V Sentences  Connected Sample 1 (Hz): 177.82 Hz   Total Fundamental Frequency  Total Fundamental Frequency Range (Hz): 117 Hz  to (Hz): 276 Hz   Sustained Phonation  Mean Fund Freq (Hz): 221.76 Hz  Relative Average Perturbation (RAP) %: 4.03 %  Noise-to-Harmonic Ratio (NHR): 0.2  Degree of Subharmonics (DSH) %: 27.27 %  Degree of Voiceless (DUV) %: 0 %  Soft Phonation Index (SPI) : 14.7  Pt has decrease in following acoustic measures:: Total Fo range, Decreased in upper range   Aerodynamic Measures  Maximum phonation time on /a/ in sec:: 4 sec  Mean Flow Rate (mL/sec): 740 mL/sec  at (Hz): 186.99 Hz  and (dB): 83.97 dB  Objective and/or perceptual measures suggest:: Weak voice, Hyperfunctional pattern  Pulmonary Breath Support: SOB  Primary Breath Support: Thoracic, Diaphragmatic  Speaks on functional residual capacity?: No       Perceptual Measures  Grade:: 2  Breathy:: 3  Strained:: 2  Rough:: 2  Asthenic:: 2  Total:: 11 /15   Tremor / Rate of Speech  Tremor:: Absent  Rate of Speech:: WFL   Subjective Measures  Glottal Function Index (GFI):: 11  Voice-related Quality of Life (V-RQOL):: 35    Session Duration : 25    Today's Charges (noted here with $$):     SLP Evaluations  $$ Voice Quality Evaluation [mins]: 10  $$ Laryngeal Function Study - Voice Ctr only [mins]: 15          I attest that I have reviewed the above information.  Signed: Jarold Song, SLP  01/01/2018 10:23 AM     Portions of this note were dictated using Dragon voice recognition software.  Though reviewed and edited, it may contain small semantic and/or syntactical errors.

## 2018-01-08 NOTE — Unmapped (Signed)
Left VM for patient letting him know the [facility] letter is signed by Dr. Foy Guadalajara.  Sent original letter in mail to patient and sent copy of it to patient MyChart.

## 2018-01-10 MED ORDER — SILDENAFIL (PULMONARY HYPERTENSION) 20 MG TABLET
ORAL_TABLET | 6 refills | 0 days | Status: CP
Start: 2018-01-10 — End: 2018-04-17
  Filled 2018-01-22: qty 180, 90d supply, fill #0

## 2018-01-10 MED ORDER — TRAMADOL 50 MG TABLET
ORAL_TABLET | 1 refills | 0 days
Start: 2018-01-10 — End: 2018-01-18

## 2018-01-15 NOTE — Unmapped (Signed)
Jeffrey Ward Howell Rph. Spoke with patient's wife on 12/25/2017 and the following information was provided to Rph who took the call. WIfe called back to say that they have about 3 weeks of medication on hand due to an extended hospitalization. Rescheduling call.

## 2018-01-16 MED ORDER — ALENDRONATE 70 MG TABLET
ORAL_TABLET | ORAL | 11 refills | 28 days | Status: CP
Start: 2018-01-16 — End: 2019-01-16
  Filled 2018-01-22: qty 4, 28d supply, fill #0

## 2018-01-16 NOTE — Unmapped (Signed)
[facility] Refill Coordination Note    Specialty Medication(s) to be Shipped:   Transplant: Cellcept 250mg , Neoral 100mg  and Neoral 25mg     Other medication(s) to be shipped: mirtazapine 15mg , alendronate 70mg , sildenafil 20mg , magnesium     Jeffrey Ward, DOB: 18-Jan-1951  Phone: [phone] (home)       All above HIPAA information was verified with patient's family member.     Completed refill call assessment today to schedule patient's medication shipment from the [facility] [phone]).       Specialty medication(s) and dose(s) confirmed: Regimen is correct and unchanged.   Changes to medications: Jeffrey Ward reports no changes reported at this time.  Changes to insurance: No  Questions for the pharmacist: No    The patient will receive a drug information handout for each medication shipped and additional FDA Medication Guides as required.      DISEASE/MEDICATION-SPECIFIC INFORMATION        N/A    ADHERENCE     Medication Adherence    Patient reported X missed doses in the last month:  0  Specialty Medication:  Neoral 100mg  and 25mg , Mycophenolate 250mg   Any gaps in refill history greater than 2 weeks in the last 3 months:  no  Demonstrates understanding of importance of adherence:  yes  Informant:  spouse  Reliability of informant:  reliable          Support network for adherence:  family member      Confirmed plan for next specialty medication refill:  delivery by pharmacy  Refills needed for supportive medications:  yes, ordered or provider notified          Refill Coordination    Has the Patients' Contact Information Changed:  No  Is the Shipping Address Different:  No         MEDICARE PART B DOCUMENTATION     Mycophenolate mofetil 250mg : Patient has 9 days worth of capsules on hand.  Neoral 25mg : Patient has 9 days worth of capsules on hand.  Neoral 100mg : Patient has 9 days worth of capsules on hand.    SHIPPING     Shipping address confirmed in Epic.     Delivery Scheduled: Yes, Expected medication delivery date: 01/23/2018 via UPS or courier.     Medication will be delivered via UPS to the home address in Epic WAM.    Jeffrey Ward   [facility] Specialty Technician

## 2018-01-18 ENCOUNTER — Ambulatory Visit: Admit: 2018-01-18 | Discharge: 2018-01-19 | Payer: MEDICARE | Attending: Neurology | Primary: Neurology

## 2018-01-18 DIAGNOSIS — E1142 Type 2 diabetes mellitus with diabetic polyneuropathy: Secondary | ICD-10-CM

## 2018-01-18 DIAGNOSIS — G6281 Critical illness polyneuropathy: Principal | ICD-10-CM

## 2018-01-18 DIAGNOSIS — G620 Drug-induced polyneuropathy: Secondary | ICD-10-CM

## 2018-01-18 MED ORDER — PREGABALIN 75 MG CAPSULE
ORAL_CAPSULE | Freq: Three times a day (TID) | ORAL | 2 refills | 0.00000 days | Status: CP
Start: 2018-01-18 — End: 2018-04-05
  Filled 2018-01-31: qty 90, 30d supply, fill #0

## 2018-01-18 NOTE — Unmapped (Signed)
You were seen today in follow up in Neurology for neuropathy.    Continue the good work on diabetes control.  I increased your Lyrica to 75mg  three times per day.  Follow up with Dr. Riccardo Dubin in rehab medicine for options for injections.  Continue work with physical therapy and work on balance to prevent falls.    Follow up with me in Neurology in 6 months.

## 2018-01-18 NOTE — Unmapped (Signed)
Neuromuscular office visit    Date: 01/18/2018  Patient Name: Jeffrey Ward  MRN: [id number]  PCP: [name]  Other    Jeffrey Ward is a 67 y.o. man seen in follow up at the [place] for peripheral neuropathy.    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 He has had some worsening in the past nine months probably related to multiple hospitalizations for infectious complications. He additionally has likely some LS spine disease contributing to pain.     Plan:      Increase lyrica to 75 tid  Follow up with Dr. Riccardo Dubin regarding interventional options for pain  Falls prevention  Follow up in 6 mo    Jamelle Rushing, MD  Assistant Professor  Department of Neurology  [place] [facility]       Subjective:      HPI:   Jeffrey Ward is a 67 y.o. man here for follow up of peripheral neuropathy and LS spinal stenosis. He was last seen 03/2017.    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 during that hospital stay  With some progressive neuropathy symptoms related to diabetes and transplant medications  He also has likely LS spine disease contributing to pain and leg symptoms  --  9 mo since last visit  Pain partially improved with Lyrica 50 tid; no side effects   Numbness in the legs a little worse - now ascended to midshin  Gait imbalance worse - had a fall and hip fracture 5 weeks ago - admitted [place], surgery right hip; still doing PT  Numbness in the hands, mild  Other hospital stays in the past 6 months for flu, hemothorax, liver abscess    Current pain meds - lyrica 50 tid  remeron for sleep  Current transplant meds - cyclosporine, cellcept    Last A1c 6.4    Past Medical Hx:  HCV  HCC  Liver transplant  BPH  GERD  Paroxysmal afib  Pulmonary hypertension  Prior tracheostomy    Past Surgical Hx:  LS spine  Liver transplant  ERCP stents  Pacemaker  Tracheostomy / reversal    Social Hx:  No tobacco, no etoh  Accompanied by wife    Family Hx:  No neuropathy in family    ALLERGIES:  No Known Allergies    CURRENT MEDICATIONS:    Current Outpatient Medications   Medication Sig Dispense Refill   ??? acetaminophen (TYLENOL) 325 MG tablet Take 1 tablet (325 mg total) by mouth every four (4) hours as needed for pain. 100 tablet 2   ??? alendronate (FOSAMAX) 70 MG tablet      ??? alendronate (FOSAMAX) 70 MG tablet Take 1 tablet (70 mg total) by mouth every seven (7) days. 4 tablet 11   ??? bisacodyl (DULCOLAX, BISACODYL,) 5 mg EC tablet Take 5 mg by mouth.     ??? blood sugar diagnostic Strp Use three (3) times a day before meals. 100 each 7   ??? blood-glucose meter (GLUCOSE MONITORING KIT) kit Use as instructed 1 each 0   ??? calcium carbonate (OS-CAL) 500 mg calcium (1,250 mg) chewable tablet Chew.     ???  cycloSPORINE modified (NEORAL) 100 MG capsule Take 100 mg by mouth.     ??? docusate sodium (COLACE) 100 MG capsule TAKE 1 CAPSULE BY MOUTH TWICE DAILY 60 capsule 0   ??? ferrous sulfate 325 (65 FE) MG tablet Take 325 mg by mouth.     ??? insulin ASPART (NOVOLOG FLEXPEN U-100 INSULIN) 100 unit/mL (3 mL) injection pen Inject 6 Units under the skin.     ??? insulin ASPART (NOVOLOG FLEXPEN U-100 INSULIN) 100 unit/mL injection pen Inject 0.06 mL (6 Units total) under the skin Three (3) times a day before meals. 4 mL 11   ??? insulin glargine (BASAGLAR, LANTUS) 100 unit/mL (3 mL) injection pen INJECT 12 UNITS SUBCUTANEOUSLY (UNDER SKIN) NIGHTLY 15 mL 10   ??? insulin glargine (BASAGLAR, LANTUS) 100 unit/mL (3 mL) injection pen INJECT 12 UNITS SUBCUTANEOUSLY (UNDER SKIN) NIGHTLY     ??? magnesium oxide-Mg AA chelate (MAGNESIUM, AMINO ACID CHELATE,) 133 mg Tab Take 2 tablets by mouth Two (2) times a day. 100 tablet PRN   ??? magnesium oxide-Mg AA chelate (MAGNESIUM, AMINO ACID CHELATE,) 133 mg Tab Take by mouth.     ??? melatonin 3 mg Tab Take 1 tablet (3 mg total) by mouth nightly. 30 tablet 0   ??? melatonin 3 mg Tab Take by mouth.     ??? mirtazapine (REMERON) 15 MG tablet TAKE 1 TABLET BY MOUTH NIGHTLY 90 tablet 3   ??? mirtazapine (REMERON) 15 MG tablet Take 15 mg by mouth.     ??? mycophenolate (CELLCEPT) 250 mg capsule TAKE 2 CAPSULES (500MG ) BY MOUTH TWICE DAILY 120 capsule 7   ??? mycophenolate (CELLCEPT) 250 mg capsule 500 mg.     ??? NEORAL 100 mg capsule TAKE 1 CAPSULE BY MOUTH TWICE DAILY WITH 3 OF THE 25MG  CAPSULES 60 each 11   ??? NEORAL 25 mg capsule TAKE 3 CAPSULES BY MOUTH TWICE DAILY WITH 100MG  CAPSULE 180 each 11   ??? nitrofurantoin, macrocrystal-monohydrate, (MACROBID) 100 MG capsule Take 1 capsule (100 mg total) by mouth twice daily until gone. 13 capsule 0   ??? pen needle, diabetic 32 gauge x 5/32 Ndle USE WITH INSULIN PEN 4 TIMES DAILY AS DIRECTED 100 each 1   ??? polyethylene glycol (GLYCOLAX) 17 gram/dose powder MIX 17 GRAMS (USE MEASURE LINE IN CAP) IN 4-8 OUNCES OF WATER, JUICE, SODA, COFFEE, OR TEA AND DRINK ONCE DAILY 527 g 0   ??? pregabalin (LYRICA) 75 MG capsule Take 1 capsule (75 mg total) by mouth Three (3) times a day. 90 capsule 2   ??? sildenafil, antihypertensive, (REVATIO) 20 mg tablet Take 40 mg by mouth Three (3) times a day.     ??? sildenafil, antihypertensive, (REVATIO) 20 mg tablet Take 40 mg by mouth.     ??? sildenafil, antihypertensive, (REVATIO) 20 mg tablet TAKE 2 TABLETS (40 MG TOTAL) BY G-TUBE THREE TIMES DAILY 180 tablet 6   ??? blood-glucose meter Misc USE AS DIRECTED TO CHECK BLOOD SUGAR 1 each 0   ??? lancing device Misc USE AS DIRECTED TO CHECK BLOOD SUGAR 1 each 0     No current facility-administered medications for this visit.        Review of Systems:  A full 10 point review of systems is negative except as noted above.     Objective:      Physical Exam:  BP 148/67 (BP Site: R Arm, BP Position: Sitting, BP Cuff Size: Medium)  - Pulse 69  - Ht 172.7 cm (5'  7.99)  - Wt 73.9 kg (163 lb)  - BMI 24.79 kg/m??     General - Well appearing, comfortable, no distress    Extremities - warm, mild edema    Neurological Examination:   Mental status - awake, alert, attentive, oriented, fluent  Cranial nerves - EOMI, face symmetric, no dysarthria   Motor - right hip flexor 4, knee ext 4+ (pain limited), otherwise 5 throughout   Sensation - vib absent toes, reduced ankles, intact knees and fingers bilat  Reflexes - 2+ bicep, tricep, brachioradialis, patellar and absent ankle jerk reflexes bilaterally  Gait - antalgic, walker       Diagnostic Studies and Review of Records:      12/2016 EMG - mild sensorimotor polyneuropathy + severe left CTS + left LS radiculopathy    11/2016 MRI LS spine - stable compression deformity T12; post surgical changes; no severe stenosis    Last A1c 6.4  2018 b12 682  03/2017 tsh normal  Most recent HCV PCR negative; 2012 cryoglobulin negative

## 2018-01-22 MED FILL — ALENDRONATE 70 MG TABLET: 28 days supply | Qty: 4 | Fill #0 | Status: AC

## 2018-01-22 MED FILL — MG-PLUS-PROTEIN 133 MG TABLET: ORAL | 25 days supply | Qty: 100 | Fill #1

## 2018-01-22 MED FILL — SILDENAFIL (PULMONARY HYPERTENSION) 20 MG TABLET: 90 days supply | Qty: 180 | Fill #0 | Status: AC

## 2018-01-22 MED FILL — MIRTAZAPINE 15 MG TABLET: ORAL | 90 days supply | Qty: 90 | Fill #0

## 2018-01-22 MED FILL — NEORAL 25 MG CAPSULE: 30 days supply | Qty: 180 | Fill #1

## 2018-01-22 MED FILL — NEORAL 100 MG CAPSULE: 30 days supply | Qty: 60 | Fill #1 | Status: AC

## 2018-01-22 MED FILL — MYCOPHENOLATE MOFETIL 250 MG CAPSULE: 30 days supply | Qty: 120 | Fill #1 | Status: AC

## 2018-01-22 MED FILL — NEORAL 100 MG CAPSULE: 30 days supply | Qty: 60 | Fill #1

## 2018-01-22 MED FILL — MYCOPHENOLATE MOFETIL 250 MG CAPSULE: 30 days supply | Qty: 120 | Fill #1

## 2018-01-22 MED FILL — MG-PLUS-PROTEIN 133 MG TABLET: 25 days supply | Qty: 100 | Fill #1 | Status: AC

## 2018-01-22 MED FILL — NEORAL 25 MG CAPSULE: 30 days supply | Qty: 180 | Fill #1 | Status: AC

## 2018-01-22 MED FILL — MIRTAZAPINE 15 MG TABLET: 90 days supply | Qty: 90 | Fill #0 | Status: AC

## 2018-01-31 MED FILL — PREGABALIN 75 MG CAPSULE: 30 days supply | Qty: 90 | Fill #0 | Status: AC

## 2018-02-02 NOTE — Unmapped (Signed)
msg. on VM: A male caller LM that pt. needs an appt. Please sch. RTC with Dr. Riccardo Dubin.

## 2018-02-12 ENCOUNTER — Ambulatory Visit
Admit: 2018-02-12 | Discharge: 2018-02-12 | Payer: MEDICARE | Attending: Physical Medicine & Rehabilitation | Primary: Physical Medicine & Rehabilitation

## 2018-02-12 DIAGNOSIS — Z981 Arthrodesis status: Secondary | ICD-10-CM

## 2018-02-12 DIAGNOSIS — M7918 Myalgia, other site: Secondary | ICD-10-CM

## 2018-02-12 DIAGNOSIS — M479 Spondylosis, unspecified: Principal | ICD-10-CM

## 2018-02-12 NOTE — Unmapped (Signed)
[facility]     Patient Name:Jeffrey Ward  MRN: [id number]  DOB: [birth year]  Age: 67 y.o.     ASSESSMENT & PLAN:     02/12/18     DIAGNOSIS:   Right low back pain- myofascial, facet mediated.  H/o fusion with spinal stenosis (XR showing lucency around left S1 screw)    TREATMENT PLAN:   TPI today right lower lumbar psp:  After signed consent, a trigger point injection was performed at the site of maximal tenderness in the right lower lumbar spine using 1% plain Lidocaine. There was no aspiration of blood or air prior to injection. This was well tolerated.    MRI lumbar spine ordered to set up for possible MBB trial and to assess screw lucency.    NEXT STEPS/FOLLOW UP: after MRI    Comprehensive patient education performed today explaining that the care plan will integrate multimodal activity-based rehabilitation techniques to enhance recovery, reduce pain, and facilitate functionality. Risks, benefits, and instructions for all medications prescribed / treatments offered  reviewed extensively with patient, who expresses understanding.     A consultation report has been transmitted to the consult requesting physician.        SUBJECTIVE:     Wife assisting with history.    Chief Complaint:    Follow-up      02/12/18:  Pt returns for f/u. Missed prior appts due to mother passing.  Has upcoming ENT surgery in January.  He also fell and fractured right hip now S/p nailing.   Currently he still complains of low back pain largely unchanged. Right  More than left. No new red flags.         Initial history:  History of Present Illness:   Jeffrey Ward is a 67 y.o. year old male being evaluated in consultation at the request of Dr. Sherlon Handing for Follow-up  67M with h/o liver transplant July 2017 presents for chronic low back pain. This worsened after hospitalization last year. He did PT for a few months.  He tries to stay active but walking makes pain worse.  Pain is mostly right and occasionally on left. He notes history of spine fractures and fusion in th 1990s. He does have numbness of the right leg to the ankle. He has bilateral paresthesias in his feet symmetrically due to polyneuropathy from diabetes (states he had EMG to confirm). No new weakness in legs.  Of note he has a h/o right foot fx and chronic loss of right toe extension strength.    Symptom Location: right low back pain  Symptom Character: tingling, numbness and aching  Symptom Onset/Mechanism: chronic (>/= 3 months)  Temporal Pattern: constant  NRS Pain Intensity: 6  Aggravating Factors: walking  Alleviating Factors: rest  Night Pain: yes   Unintended Weight Loss: yes (related to transplant)  Fever/Infection:no  Neuromotor Function: motor weakness - (no),  gait/coordination disturbance - (yes) falls - last was a few months ago, loss of bowel or bladder control - (no), saddle anesthesia - (no)     Prior Interventions/Modalities:  Tylenol 500mg  prn    Lumbar injections in the past- 33yrs ago, helped about a week.    Prior Diagnostics:  MRI lumbar spine 9/18  -Unchanged compression deformity of T12. Stable grade 2 anterolisthesis of L5 on S1. Posterior fixation hardware in place without MRI evidence of complication.  -Mild canal and neural foraminal stenosis at L4-5 and L5-S1.    XR lumbar spine 11/17/17  Similar appearance  of posterior spinal hardware spanning T10-L2 and L5-S1. Lucency surrounding the left S1 screw may reflect loosening, attention on follow-up.  Similar height loss at T12 compression fracture.  Multilevel mid to lower lumbar spondylosis, most prominent at L5-S1 with similar grade 2 anterolisthesis of L5-S1.    Current Outpatient Medications   Medication Sig Dispense Refill   ??? alendronate (FOSAMAX) 70 MG tablet      ??? alendronate (FOSAMAX) 70 MG tablet Take 1 tablet (70 mg total) by mouth every seven (7) days. 4 tablet 11   ??? bisacodyl (DULCOLAX, BISACODYL,) 5 mg EC tablet Take 5 mg by mouth.     ??? blood sugar diagnostic Strp Use three (3) times a day before meals. 100 each 7   ??? blood-glucose meter (GLUCOSE MONITORING KIT) kit Use as instructed 1 each 0   ??? calcium carbonate (OS-CAL) 500 mg calcium (1,250 mg) chewable tablet Chew.     ??? cycloSPORINE modified (NEORAL) 100 MG capsule Take 100 mg by mouth.     ??? ferrous sulfate 325 (65 FE) MG tablet Take 325 mg by mouth.     ??? insulin glargine (BASAGLAR, LANTUS) 100 unit/mL (3 mL) injection pen INJECT 12 UNITS SUBCUTANEOUSLY (UNDER SKIN) NIGHTLY 15 mL 10   ??? insulin glargine (BASAGLAR, LANTUS) 100 unit/mL (3 mL) injection pen INJECT 12 UNITS SUBCUTANEOUSLY (UNDER SKIN) NIGHTLY     ??? magnesium oxide-Mg AA chelate (MAGNESIUM, AMINO ACID CHELATE,) 133 mg Tab Take 2 tablets by mouth Two (2) times a day. 100 tablet PRN   ??? magnesium oxide-Mg AA chelate (MAGNESIUM, AMINO ACID CHELATE,) 133 mg Tab Take by mouth.     ??? melatonin 3 mg Tab Take 1 tablet (3 mg total) by mouth nightly. 30 tablet 0   ??? melatonin 3 mg Tab Take by mouth.     ??? mirtazapine (REMERON) 15 MG tablet TAKE 1 TABLET BY MOUTH NIGHTLY 90 tablet 3   ??? mirtazapine (REMERON) 15 MG tablet Take 15 mg by mouth.     ??? mycophenolate (CELLCEPT) 250 mg capsule TAKE 2 CAPSULES (500MG ) BY MOUTH TWICE DAILY 120 capsule 7   ??? mycophenolate (CELLCEPT) 250 mg capsule 500 mg.     ??? NEORAL 100 mg capsule TAKE 1 CAPSULE BY MOUTH TWICE DAILY WITH 3 OF THE 25MG  CAPSULES 60 each 11   ??? NEORAL 25 mg capsule TAKE 3 CAPSULES BY MOUTH TWICE DAILY WITH 100MG  CAPSULE 180 each 11   ??? pen needle, diabetic 32 gauge x 5/32 Ndle USE WITH INSULIN PEN 4 TIMES DAILY AS DIRECTED 100 each 1   ??? polyethylene glycol (GLYCOLAX) 17 gram/dose powder MIX 17 GRAMS (USE MEASURE LINE IN CAP) IN 4-8 OUNCES OF WATER, JUICE, SODA, COFFEE, OR TEA AND DRINK ONCE DAILY 527 g 0   ??? pregabalin (LYRICA) 75 MG capsule Take 1 capsule (75 mg total) by mouth Three (3) times a day. 90 capsule 2   ??? sildenafil, antihypertensive, (REVATIO) 20 mg tablet Take 40 mg by mouth Three (3) times a day. ??? sildenafil, antihypertensive, (REVATIO) 20 mg tablet Take 40 mg by mouth.     ??? sildenafil, antihypertensive, (REVATIO) 20 mg tablet TAKE 2 TABLETS (40 MG TOTAL) BY G-TUBE THREE TIMES DAILY 180 tablet 6   ??? blood-glucose meter Misc USE AS DIRECTED TO CHECK BLOOD SUGAR 1 each 0   ??? docusate sodium (COLACE) 100 MG capsule TAKE 1 CAPSULE BY MOUTH TWICE DAILY (Patient not taking: Reported on 02/12/2018) 60 capsule 0   ???  insulin ASPART (NOVOLOG FLEXPEN U-100 INSULIN) 100 unit/mL (3 mL) injection pen Inject 6 Units under the skin.     ??? insulin ASPART (NOVOLOG FLEXPEN U-100 INSULIN) 100 unit/mL injection pen Inject 0.06 mL (6 Units total) under the skin Three (3) times a day before meals. 4 mL 11   ??? lancing device Misc USE AS DIRECTED TO CHECK BLOOD SUGAR 1 each 0   ??? nitrofurantoin, macrocrystal-monohydrate, (MACROBID) 100 MG capsule Take 1 capsule (100 mg total) by mouth twice daily until gone. (Patient not taking: Reported on 02/12/2018) 13 capsule 0     No current facility-administered medications for this visit.        Allergies:   Patient has no known allergies.    Past Medical / Surgical History:     Past Medical History:   Diagnosis Date   ??? Atrial fibrillation (CMS-HCC)    ??? Basal cell carcinoma    ??? Cancer (CMS-HCC)    ??? Cirrhosis (CMS-HCC)    ??? Depression    ??? Diabetes (CMS-HCC)    ??? Headache    ??? Hepatitis C 07/17/2012   ??? History of transfusion    ??? Liver disease    ??? Low back pain 07/17/2012   ??? Varices, esophageal (CMS-HCC)        Past Surgical History:   Procedure Laterality Date   ??? ANKLE SURGERY     ??? BACK SURGERY     ??? BACK SURGERY     ??? CARDIAC SURGERY      pacemaker   ??? CHG US GUIDE, TISSUE ABLATION N/A 01/22/2016    Procedure: ULTRASOUND GUIDANCE FOR, AND MONITORING OF, PARENCHYMAL TISSUE ABLATION;  Surgeon: Particia Nearing, MD;  Location: MAIN OR [facility];  Service: Transplant   ??? IR EMBOLIZATION ORGAN ISCHEMIA, TUMORS, INFAR  06/16/2016    IR EMBOLIZATION ORGAN ISCHEMIA, TUMORS, INFAR 06/16/2016 Ammie Dalton, MD IMG VIR H&[facility]   ??? PR COLON CA SCRN NOT HI RSK IND N/A 02/27/2015    Procedure: COLOREC CNCR SCR;COLNSCPY NO;  Surgeon: Vonda Antigua, MD;  Location: GI PROCEDURES [facility];  Service: Gastroenterology   ??? PR ENDOSCOPIC ULTRASOUND EXAM N/A 02/27/2015    Procedure: UGI ENDO; W/ENDO ULTRASOUND EXAM INCLUDES ESOPHAGUS, STOMACH, &/OR DUODENUM/JEJUNUM;  Surgeon: Vonda Antigua, MD;  Location: GI PROCEDURES [facility];  Service: Gastroenterology   ??? PR ERCP BALLOON DILATE BILIARY/PANC DUCT/AMPULLA EA N/A 02/10/2017    Procedure: ERCP;WITH TRANS-ENDOSCOPIC BALLOON DILATION OF BILIARY/PANCREATIC DUCT(S) OR OF AMPULLA, INCLUDING SPHINCTERECTOMY, WHEN PERFOREMD,EACH DUCT ([address]109);  Surgeon: Mayford Knife, MD;  Location: GI PROCEDURES [facility];  Service: Gastroenterology   ??? PR ERCP REMOVE FOREIGN BODY/STENT BILIARY/PANC DUCT N/A 02/10/2017    Procedure: ENDOSCOPIC RETROGRADE CHOLANGIOPANCREATOGRAPHY (ERCP); W/ REMOVAL OF FOREIGN BODY/STENT FROM BILIARY/PANCREATIC DUCT(S);  Surgeon: Mayford Knife, MD;  Location: GI PROCEDURES [facility];  Service: Gastroenterology   ??? PR ERCP REMOVE FOREIGN BODY/STENT BILIARY/PANC DUCT N/A 06/29/2017    Procedure: ENDOSCOPIC RETROGRADE CHOLANGIOPANCREATOGRAPHY (ERCP); W/ REMOVAL OF FOREIGN BODY/STENT FROM BILIARY/PANCREATIC DUCT(S);  Surgeon: Vonda Antigua, MD;  Location: GI PROCEDURES [facility];  Service: Gastroenterology   ??? PR ERCP STENT PLACEMENT BILIARY/PANCREATIC DUCT N/A 11/29/2016    Procedure: ENDOSCOPIC RETROGRADE CHOLANGIOPANCREATOGRAPHY (ERCP); WITH PLACEMENT OF ENDOSCOPIC STENT INTO BILIARY OR PANCREATIC DUCT;  Surgeon: Chriss Driver, MD;  Location: GI PROCEDURES [facility];  Service: Gastroenterology   ??? PR ERCP STENT PLACEMENT BILIARY/PANCREATIC DUCT N/A 04/26/2017    Procedure: ENDOSCOPIC RETROGRADE CHOLANGIOPANCREATOGRAPHY (ERCP); WITH  PLACEMENT OF ENDOSCOPIC STENT INTO BILIARY OR PANCREATIC DUCT;  Surgeon: Mayford Knife, MD;  Location: GI PROCEDURES [facility];  Service: Gastroenterology   ??? PR ERCP,W/REMOVAL STONE,BIL/PANCR DUCTS N/A 11/29/2016    Procedure: ERCP; W/ENDOSCOPIC RETROGRADE REMOVAL OF CALCULUS/CALCULI FROM BILIARY &/OR PANCREATIC DUCTS;  Surgeon: Chriss Driver, MD;  Location: GI PROCEDURES [facility];  Service: Gastroenterology   ??? PR ERCP,W/REMOVAL STONE,BIL/PANCR DUCTS N/A 02/10/2017    Procedure: ERCP; W/ENDOSCOPIC RETROGRADE REMOVAL OF CALCULUS/CALCULI FROM BILIARY &/OR PANCREATIC DUCTS;  Surgeon: Mayford Knife, MD;  Location: GI PROCEDURES [facility];  Service: Gastroenterology   ??? PR ERCP,W/REMOVAL STONE,BIL/PANCR DUCTS N/A 04/26/2017    Procedure: ERCP; W/ENDOSCOPIC RETROGRADE REMOVAL OF CALCULUS/CALCULI FROM BILIARY &/OR PANCREATIC DUCTS;  Surgeon: Mayford Knife, MD;  Location: GI PROCEDURES [facility]OR[facility];  Service: Gastroenterology   ??? PR ERCP,W/REMOVAL STONE,BIL/PANCR DUCTS N/A 06/29/2017    Procedure: ERCP; W/ENDOSCOPIC RETROGRADE REMOVAL OF CALCULUS/CALCULI FROM BILIARY &/OR PANCREATIC DUCTS;  Surgeon: Vonda Antigua, MD;  Location: GI PROCEDURES [facility];  Service: Gastroenterology   ??? PR INSER HEART TEMP PACER ONE CHMBR N/A 10/02/2016    Procedure: Tempoarary Pacemaker Insertion;  Surgeon: Meredith Leeds, MD;  Location: [facility];  Service: Cardiology   ??? PR LAP,DIAGNOSTIC ABDOMEN N/A 01/22/2016    Procedure: Laparoscopy, Abdomen, Peritoneum, & Omentum, Diagnostic, W/Wo Collection Specimen(S) By Brushing Or Washing;  Surgeon: Particia Nearing, MD;  Location: MAIN OR [facility];  Service: Transplant   ??? PR PLACE PERCUT GASTROSTOMY TUBE N/A 11/17/2016    Procedure: UGI ENDO; W/DIRECTED PLCMT PERQ GASTROSTOMY TUBE;  Surgeon: Cletis Athens, MD;  Location: GI PROCEDURES [facility];  Service: Gastroenterology   ??? PR TRACHEOSTOMY, PLANNED N/A 09/29/2016    Procedure: TRACHEOSTOMY PLANNED (SEPART PROC);  Surgeon: Katherina Mires, MD;  Location: MAIN OR [facility];  Service: Trauma   ??? PR TRANSCATH INSERT OR REPLACE LEADLESS PM VENTR N/A 10/11/2016    Procedure: Pacemaker Implant/Replace Leadless;  Surgeon: Meredith Leeds, MD;  Location: [facility];  Service: Cardiology   ??? PR TRANSPLANT LIVER,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    Procedure: Liver Allotransplantation; Orthotopic, Partial Or Whole, From Cadaver Or Living Donor, Any Age;  Surgeon: Doyce Loose, MD;  Location: MAIN OR [facility];  Service: Transplant   ??? PR TRANSPLANT,PREP DONOR LIVER, WHOLE N/A 09/15/2016    Procedure: Rogelia Boga Std Prep Cad Donor Whole Liver Gft Prior Tnsplnt,Inc Chole,Diss/Rem Surr Tissu Wo Triseg/Lobe Splt;  Surgeon: Doyce Loose, MD;  Location: MAIN OR [facility];  Service: Transplant   ??? PR UPPER GI ENDOSCOPY,BIOPSY N/A 07/17/2012    Procedure: UGI ENDOSCOPY; WITH BIOPSY, SINGLE OR MULTIPLE;  Surgeon: Alba Destine, MD;  Location: GI PROCEDURES [facility];  Service: Gastroenterology   ??? PR UPPER GI ENDOSCOPY,DIAGNOSIS N/A 02/04/2014    Procedure: UGI ENDO, INCLUDE ESOPHAGUS, STOMACH, & DUODENUM &/OR JEJUNUM; DX W/WO COLLECTION SPECIMN, BY BRUSH OR WASH;  Surgeon: Wilburt Finlay, MD;  Location: GI PROCEDURES [facility];  Service: Gastroenterology   ??? PR UPPER GI ENDOSCOPY,LIGAT VARIX N/A 11/05/2013    Procedure: UGI ENDO; W/BAND LIG ESOPH &/OR GASTRIC VARICES;  Surgeon: Wilburt Finlay, MD;  Location: GI PROCEDURES [facility];  Service: Gastroenterology       Social History     Socioeconomic History   ??? Marital status: Married     Spouse name: None   ??? Number of children: None   ??? Years of education: None   ??? Highest education level: None  Occupational History   ??? None   Social Needs   ??? Financial resource strain: None   ??? Food insecurity:     Worry: None     Inability: None   ??? Transportation needs:     Medical: None     Non-medical: None   Tobacco Use   ??? Smoking status: Never Smoker   ??? Smokeless tobacco: Never Used   ??? Tobacco comment: Smoked in high school for about 5 years. Substance and Sexual Activity   ??? Alcohol use: No   ??? Drug use: No   ??? Sexual activity: Yes     Partners: Female     Birth control/protection: None   Lifestyle   ??? Physical activity:     Days per week: None     Minutes per session: None   ??? Stress: None   Relationships   ??? Social connections:     Talks on phone: None     Gets together: None     Attends religious service: None     Active member of club or organization: None     Attends meetings of clubs or organizations: None     Relationship status: None   Other Topics Concern   ??? Do you use sunscreen? Not Asked   ??? Tanning bed use? Not Asked   ??? Are you easily burned? Not Asked   ??? Excessive sun exposure? Not Asked   ??? Blistering sunburns? Not Asked   ??? Exercise Not Asked   ??? Living Situation Not Asked   Social History Narrative   ??? None       Family History   Problem Relation Age of Onset   ??? Hypertension Mother    ??? Cirrhosis Neg Hx    ??? Liver cancer Neg Hx    ??? Anemia Neg Hx    ??? Cancer Neg Hx    ??? Diabetes Neg Hx    ??? Kidney disease Neg Hx    ??? Obesity Neg Hx    ??? Thyroid disease Neg Hx    ??? Osteoporosis Neg Hx    ??? Coronary artery disease Neg Hx    ??? Anesthesia problems Neg Hx    ??? Basal cell carcinoma Neg Hx    ??? Squamous cell carcinoma Neg Hx        For any of the above entries which indicate no records on file, the patient reports no relevant history.                 Review of Systems:   Review of Systems was completed through a 10 organ system review and is listed in the chart.  Pertinent positives are noted in HPI or flowsheet and otherwise negative.  Patient has been instructed to followup with PCP or appropriate specialist for symptoms outside the purview of this speciality.    OBJECTIVE:     Vitals:     Ht 172.7 cm (5' 7.99)  - BMI 24.79 kg/m??     The above medications, allergies, history, and ROS, and vitals have been reviewed.    Physical Exam:   GEN: alert and oriented, no apparent distress  HEENT: normocephalic, atraumatic, anicteric, moist mucous membranes  CV: normal heart rate  PULM: normal work of breathing  GI: nondistended  EXT: no swelling, edema in b/l UE and LE  SKIN: no visible ecchymosis or breakdown  PSYCH: normal mood and affect    NEURO:   Motor   LLE 5/5 hip  flexor, 5/5 knee extensor, 5/5 ankle dorsiflexor, 5/5 long toe extensor, 5/5 ankle plantar flexor  RLE 5/5 hip flexor, 5/5 knee extensor, 5/5 ankle dorsiflexor, 0/5 long toe extensor, 5/5 ankle plantar flexor    Reflexes  Reflexes reduced  Babinski: - (negative) LLE, - (negative) RLE      MSK:  Gait and Station: wide based with walker (cane prior)    Lumbar Spine:  Inspection: Normal alignment. No erythema, discoloration, or asymmetry. Surgical scar noted  Palpation: right psp ttp, L5-S1 jxn ttp  ROM: pain all directions worse in ext  Facet Loading Pain: bilat +      Tia Masker, MD  Assistant Professor - PM&R  Musculoskeletal and Spine Specialist - [facility] [place]

## 2018-02-12 NOTE — Unmapped (Signed)
I ordered lumbar MRI today to set you up for spine injection trial for your low back pain. Also to evaluate your potentially loose screw.     Will refer you to my injection partners after MRI if indicated.    We performed trigger point injection in your low back today. Ice tonight if sore from the injection.

## 2018-02-13 ENCOUNTER — Ambulatory Visit: Admit: 2018-02-13 | Discharge: 2018-02-13 | Payer: MEDICARE

## 2018-02-13 DIAGNOSIS — K766 Portal hypertension: Secondary | ICD-10-CM

## 2018-02-13 DIAGNOSIS — R0600 Dyspnea, unspecified: Secondary | ICD-10-CM

## 2018-02-13 DIAGNOSIS — I2721 Secondary pulmonary arterial hypertension: Principal | ICD-10-CM

## 2018-02-13 DIAGNOSIS — Z944 Liver transplant status: Secondary | ICD-10-CM

## 2018-02-13 MED ORDER — AMOXICILLIN 875 MG-POTASSIUM CLAVULANATE 125 MG TABLET
ORAL_TABLET | 0 refills | 0 days
Start: 2018-02-13 — End: 2018-04-17

## 2018-02-13 MED ORDER — AZITHROMYCIN 250 MG TABLET
ORAL_TABLET | 1 refills | 0 days
Start: 2018-02-13 — End: 2018-04-17

## 2018-02-13 NOTE — Unmapped (Signed)
[name] Refill Coordination Note    Specialty Medication(s) to be Shipped:   Transplant: mycophenolate mofetil 250mg , Neoral 25mg  and Neoral 100mg   Other medication(s) to be shipped: Pen Needles, Alendronate & Mag Plus Protein 133mg      Jeffrey Ward, DOB: 17-Feb-1951  Phone: [phone] (home)     All above HIPAA information was verified with patient's caregiver. Jeffrey Ward Wife     Completed refill call assessment today to schedule patient's medication shipment from the [facility] [phone]).       Specialty medication(s) and dose(s) confirmed: Regimen is correct and unchanged.   Changes to medications: Jeffrey Ward reports no changes reported at this time.  Changes to insurance: No  Questions for the pharmacist: No    The patient will receive a drug information handout for each medication shipped and additional FDA Medication Guides as required.      DISEASE/MEDICATION-SPECIFIC INFORMATION        N/A    ADHERENCE     Medication Adherence    Patient reported X missed doses in the last month:  0  Specialty Medication:  Neoral 25mg  & 100mg  & Mycophenolate 250mg   Patient is on additional specialty medications:  No  Patient is on more than two specialty medications:  No  Any gaps in refill history greater than 2 weeks in the last 3 months:  no  Demonstrates understanding of importance of adherence:  yes  Informant:  spouse  Reliability of informant:  reliable          Support network for adherence:  family member      Confirmed plan for next specialty medication refill:  delivery by pharmacy          Refill Coordination    Has the Patients' Contact Information Changed:  No  Is the Shipping Address Different:  No         MEDICARE PART B DOCUMENTATION     Mycophenolate mofetil 250mg : Patient has 10 days worth of capsules on hand.  Neoral 25mg : Patient has 10 days worth of capsules on hand.  Neoral 100mg : Patient has 10 days worth of capsules on hand.    SHIPPING     Shipping address confirmed in Epic.     Delivery Scheduled: Yes, Expected medication delivery date: 02/23/2018 via UPS or courier.     Medication will be delivered via UPS to the home address in Epic [place].    Jeffrey Ward Jeffrey Ward   [facility] Specialty Technician

## 2018-02-13 NOTE — Unmapped (Signed)
Thank you for visiting the [facility] Pulmonary Hypertension Clinic.    If you have any questions or concerns, please call     H. Sherre Lain, MD    Marva Panda, RN  Claretha Cooper, RN    Pulmonary Hypertension nurse coordinators:    [phone].

## 2018-02-13 NOTE — Unmapped (Signed)
[facility] at [facility]   Pulmonary Hypertension Program                                               Lattie Haw, MD???Director (Pulmonary)     Denice Bors. Tresa Res, MD--Associate Director (Pulmonary)    Freeman Caldron, MD (Cardiology)     Esmond Plants, DO (Pulmonary)    Marva Panda, RN Nurse Coordinator     Claretha Cooper, RN Nurse Coordinator    [phone] office - [phone] fax          Jeffrey Ward  is a 67 y.o. male  patient referred by [name], MD for work up and evaluation for pulmonary hypertension.    PAH Treatment History:  Sildenafil 40 mg tid since post liver txplt hospitalization.    Pt describes a history of recent liver txplt in 09/2016.  Post op was noted to have previously unrecognized PAH and RV failure, treated initially with inhaled iloprost while on vent support and then transitioned to sildenafil.  He had a long post op course requiring a stay in rehab of about 2 weeks prior to discharge.   His post op course was further complicated by AKI (on CRRT then had renal recovery), afib, bradycardia requiring pacemaker placement.  CTA negative for PE post op.      No LE edema currently.      He continues on sildenafil 40 tid and is receiving from [facility] pharmacy.      He is no longer on warfarin.    No syncope or near syncope.      Denies dyspnea, syncope or near syncope.      Had recent hip fx s/p surgery end of Sept 2019.  Currently undergoing rehab.      Has been having URI symptoms last two day.  Antibx was called in by his PCP.  No fevers.      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      The patient's current NYHA functional class is Class 2 - comfortable at rest but have symptoms with ordinary physcial activity.     Last echo 04/2017 very reassuring from right heart standpoint.  Plan continue sildenafil at current dose.        Will need to followup echo from today.        Plan:          1) continue sildenafil 40 mg tid.      2) next visit in 4 months.      3) follow up echo report from earlier today.      4) antibx as per PCP for URI.      Subjective:      Past medical history is significant for:   Past Medical History:   Diagnosis Date   ??? Atrial fibrillation (CMS-HCC)    ??? Basal cell carcinoma    ??? Cancer (CMS-HCC)    ??? Cirrhosis (CMS-HCC)    ??? Depression    ??? Diabetes (CMS-HCC)    ??? Headache    ??? Hepatitis C 07/17/2012   ??? History of transfusion    ??? Liver disease    ??? Low back pain 07/17/2012   ??? Varices, esophageal (CMS-HCC)  Family History   Problem Relation Age of Onset   ??? Hypertension Mother    ??? Cirrhosis Neg Hx    ??? Liver cancer Neg Hx    ??? Anemia Neg Hx    ??? Cancer Neg Hx    ??? Diabetes Neg Hx    ??? Kidney disease Neg Hx    ??? Obesity Neg Hx    ??? Thyroid disease Neg Hx    ??? Osteoporosis Neg Hx    ??? Coronary artery disease Neg Hx    ??? Anesthesia problems Neg Hx    ??? Basal cell carcinoma Neg Hx    ??? Squamous cell carcinoma Neg Hx       Social History     Socioeconomic History   ??? Marital status: Married     Spouse name: Not on file   ??? Number of children: Not on file   ??? Years of education: Not on file   ??? Highest education level: Not on file   Occupational History   ??? Not on file   Social Needs   ??? Financial resource strain: Not on file   ??? Food insecurity:     Worry: Not on file     Inability: Not on file   ??? Transportation needs:     Medical: Not on file     Non-medical: Not on file   Tobacco Use   ??? Smoking status: Never Smoker   ??? Smokeless tobacco: Never Used   ??? Tobacco comment: Smoked in high school for about 5 years.    Substance and Sexual Activity   ??? Alcohol use: No   ??? Drug use: No   ??? Sexual activity: Yes     Partners: Female     Birth control/protection: None   Lifestyle   ??? Physical activity:     Days per week: Not on file     Minutes per session: Not on file   ??? Stress: Not on file   Relationships   ??? Social connections:     Talks on phone: Not on file     Gets together: Not on file     Attends religious service: Not on file     Active member of club or organization: Not on file     Attends meetings of clubs or organizations: Not on file     Relationship status: Not on file   Other Topics Concern   ??? Do you use sunscreen? Not Asked   ??? Tanning bed use? Not Asked   ??? Are you easily burned? Not Asked   ??? Excessive sun exposure? Not Asked   ??? Blistering sunburns? Not Asked   ??? Exercise Not Asked   ??? Living Situation Not Asked   Social History Narrative   ??? Not on file      Past Surgical History:   Procedure Laterality Date   ??? ANKLE SURGERY     ??? BACK SURGERY     ??? BACK SURGERY     ??? CARDIAC SURGERY      pacemaker   ??? CHG US GUIDE, TISSUE ABLATION N/A 01/22/2016    Procedure: ULTRASOUND GUIDANCE FOR, AND MONITORING OF, PARENCHYMAL TISSUE ABLATION;  Surgeon: Particia Nearing, MD;  Location: MAIN OR [facility];  Service: Transplant   ??? IR EMBOLIZATION ORGAN ISCHEMIA, TUMORS, INFAR  06/16/2016    IR EMBOLIZATION ORGAN ISCHEMIA, TUMORS, INFAR 06/16/2016 Ammie Dalton, MD IMG VIR H&V [facility]   ??? PR COLON CA SCRN NOT  HI RSK IND N/A 02/27/2015    Procedure: COLOREC CNCR SCR;COLNSCPY NO;  Surgeon: Vonda Antigua, MD;  Location: GI PROCEDURES MEMORIAL [facility];  Service: Gastroenterology   ??? PR ENDOSCOPIC ULTRASOUND EXAM N/A 02/27/2015    Procedure: UGI ENDO; W/ENDO ULTRASOUND EXAM INCLUDES ESOPHAGUS, STOMACH, &/OR DUODENUM/JEJUNUM;  Surgeon: Vonda Antigua, MD;  Location: GI PROCEDURES MEMORIAL [facility];  Service: Gastroenterology   ??? PR ERCP BALLOON DILATE BILIARY/PANC DUCT/AMPULLA EA N/A 02/10/2017    Procedure: ERCP;WITH TRANS-ENDOSCOPIC BALLOON DILATION OF BILIARY/PANCREATIC DUCT(S) OR OF AMPULLA, INCLUDING SPHINCTERECTOMY, WHEN PERFOREMD,EACH DUCT (16109);  Surgeon: Mayford Knife, MD;  Location: GI PROCEDURES MEMORIAL [facility];  Service: Gastroenterology   ??? PR ERCP REMOVE FOREIGN BODY/STENT BILIARY/PANC DUCT N/A 02/10/2017    Procedure: ENDOSCOPIC RETROGRADE CHOLANGIOPANCREATOGRAPHY (ERCP); W/ REMOVAL OF FOREIGN BODY/STENT FROM BILIARY/PANCREATIC DUCT(S);  Surgeon: Mayford Knife, MD;  Location: GI PROCEDURES MEMORIAL [facility];  Service: Gastroenterology   ??? PR ERCP REMOVE FOREIGN BODY/STENT BILIARY/PANC DUCT N/A 06/29/2017    Procedure: ENDOSCOPIC RETROGRADE CHOLANGIOPANCREATOGRAPHY (ERCP); W/ REMOVAL OF FOREIGN BODY/STENT FROM BILIARY/PANCREATIC DUCT(S);  Surgeon: Vonda Antigua, MD;  Location: GI PROCEDURES MEMORIAL [facility];  Service: Gastroenterology   ??? PR ERCP STENT PLACEMENT BILIARY/PANCREATIC DUCT N/A 11/29/2016    Procedure: ENDOSCOPIC RETROGRADE CHOLANGIOPANCREATOGRAPHY (ERCP); WITH PLACEMENT OF ENDOSCOPIC STENT INTO BILIARY OR PANCREATIC DUCT;  Surgeon: Chriss Driver, MD;  Location: GI PROCEDURES [facility]IAL [facility];  Service: Gastroenterology   ??? PR ERCP STENT PLACEMENT BILIARY/PANCREATIC DUCT N/A 04/26/2017    Procedure: ENDOSCOPIC RETROGRADE CHOLANGIOPANCREATOGRAPHY (ERCP); WITH PLACEMENT OF ENDOSCOPIC STENT INTO BILIARY OR PANCREATIC DUCT;  Surgeon: Mayford Knife, MD;  Location: GI PROCEDURES MEMORIAL [facility];  Service: Gastroenterology   ??? PR ERCP,W/REMOVAL STONE,BIL/PANCR DUCTS N/A 11/29/2016    Procedure: ERCP; W/ENDOSCOPIC RETROGRADE REMOVAL OF CALCULUS/CALCULI FROM BILIARY &/OR PANCREATIC DUCTS;  Surgeon: Chriss Driver, MD;  Location: GI PROCEDURES MEMORIAL [facility];  Service: Gastroenterology   ??? PR ERCP,W/REMOVAL STONE,BIL/PANCR DUCTS N/A 02/10/2017    Procedure: ERCP; W/ENDOSCOPIC RETROGRADE REMOVAL OF CALCULUS/CALCULI FROM BILIARY &/OR PANCREATIC DUCTS;  Surgeon: Mayford Knife, MD;  Location: GI PROCEDURES [facility]OR[facility];  Service: Gastroenterology   ??? PR ERCP,W/REMOVAL STONE,BIL/PANCR DUCTS N/A 04/26/2017    Procedure: ERCP; W/ENDOSCOPIC RETROGRADE REMOVAL OF CALCULUS/CALCULI FROM BILIARY &/OR PANCREATIC DUCTS;  Surgeon: Mayford Knife, MD;  Location: GI PROCEDURES MEMORIAL [facility];  Service: Gastroenterology   ??? PR ERCP,W/REMOVAL STONE,BIL/PANCR DUCTS N/A 06/29/2017    Procedure: ERCP; W/ENDOSCOPIC RETROGRADE REMOVAL OF CALCULUS/CALCULI FROM BILIARY &/OR PANCREATIC DUCTS;  Surgeon: Vonda Antigua, MD;  Location: GI PROCEDURES MEMORIAL [facility];  Service: Gastroenterology   ??? PR INSER HEART TEMP PACER ONE CHMBR N/A 10/02/2016    Procedure: Tempoarary Pacemaker Insertion;  Surgeon: Meredith Leeds, MD;  Location: [facility];  Service: Cardiology   ??? PR LAP,DIAGNOSTIC ABDOMEN N/A 01/22/2016    Procedure: Laparoscopy, Abdomen, Peritoneum, & Omentum, Diagnostic, W/Wo Collection Specimen(S) By Brushing Or Washing;  Surgeon: Particia Nearing, MD;  Location: MAIN OR [facility];  Service: Transplant   ??? PR PLACE PERCUT GASTROSTOMY TUBE N/A 11/17/2016    Procedure: UGI ENDO; W/DIRECTED PLCMT PERQ GASTROSTOMY TUBE;  Surgeon: Cletis Athens, MD;  Location: GI PROCEDURES MEMORIAL [facility];  Service: Gastroenterology   ??? PR TRACHEOSTOMY, PLANNED N/A 09/29/2016    Procedure: TRACHEOSTOMY PLANNED (SEPART PROC);  Surgeon: Katherina Mires, MD;  Location: MAIN OR [facility];  Service: Trauma   ??? PR TRANSCATH INSERT OR REPLACE LEADLESS PM VENTR N/A 10/11/2016    Procedure: Pacemaker Implant/Replace Leadless;  Surgeon: Meredith Leeds, MD;  Location: [facility];  Service: Cardiology   ??? PR TRANSPLANT LIVER,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    Procedure: Liver Allotransplantation; Orthotopic, Partial Or Whole, From Cadaver Or Living Donor, Any Age;  Surgeon: Doyce Loose, MD;  Location: MAIN OR [facility];  Service: Transplant   ??? PR TRANSPLANT,PREP DONOR LIVER, WHOLE N/A 09/15/2016    Procedure: Rogelia Boga Std Prep Cad Donor Whole Liver Gft Prior Tnsplnt,Inc Chole,Diss/Rem Surr Tissu Wo Triseg/Lobe Splt;  Surgeon: Doyce Loose, MD;  Location: MAIN OR [facility];  Service: Transplant   ??? PR UPPER GI ENDOSCOPY,BIOPSY N/A 07/17/2012    Procedure: UGI ENDOSCOPY; WITH BIOPSY, SINGLE OR MULTIPLE;  Surgeon: Alba Destine, MD;  Location: GI PROCEDURES MEMORIAL [facility];  Service: Gastroenterology   ??? PR UPPER GI ENDOSCOPY,DIAGNOSIS N/A 02/04/2014    Procedure: UGI ENDO, INCLUDE ESOPHAGUS, STOMACH, & DUODENUM &/OR JEJUNUM; DX W/WO COLLECTION SPECIMN, BY BRUSH OR WASH;  Surgeon: Wilburt Finlay, MD;  Location: GI PROCEDURES MEMORIAL [facility];  Service: Gastroenterology   ??? PR UPPER GI ENDOSCOPY,LIGAT VARIX N/A 11/05/2013    Procedure: UGI ENDO; W/BAND LIG ESOPH &/OR GASTRIC VARICES;  Surgeon: Wilburt Finlay, MD;  Location: GI PROCEDURES MEMORIAL [facility];  Service: Gastroenterology        Objective:   Objective   Review of systems is significant for:     As stated in the HPI, remainder of 12 system review is negative.      No Known Allergies   Current Outpatient Medications   Medication Sig Dispense Refill   ??? acetaminophen (TYLENOL) 325 MG tablet Take 1 tablet (325 mg total) by mouth every four (4) hours as needed for pain. 100 tablet 2   ??? alendronate (FOSAMAX) 70 MG tablet Take 1 tablet (70 mg total) by mouth every seven (7) days. 4 tablet 11   ??? bisacodyl (DULCOLAX, BISACODYL,) 5 mg EC tablet Take 5 mg by mouth.     ??? blood sugar diagnostic Strp Use three (3) times a day before meals. 100 each 7   ??? blood-glucose meter (GLUCOSE MONITORING KIT) kit Use as instructed 1 each 0   ??? calcium carbonate (OS-CAL) 500 mg calcium (1,250 mg) chewable tablet Chew.     ??? cycloSPORINE modified (NEORAL) 100 MG capsule Take 100 mg by mouth.     ??? docusate sodium (COLACE) 100 MG capsule TAKE 1 CAPSULE BY MOUTH TWICE DAILY 60 capsule 0   ??? ferrous sulfate 325 (65 FE) MG tablet Take 325 mg by mouth.     ??? insulin ASPART (NOVOLOG FLEXPEN U-100 INSULIN) 100 unit/mL (3 mL) injection pen Inject 6 Units under the skin.     ??? insulin glargine (BASAGLAR, LANTUS) 100 unit/mL (3 mL) injection pen INJECT 12 UNITS SUBCUTANEOUSLY (UNDER SKIN) NIGHTLY 15 mL 10   ??? insulin glargine (BASAGLAR, LANTUS) 100 unit/mL (3 mL) injection pen INJECT 12 UNITS SUBCUTANEOUSLY (UNDER SKIN) NIGHTLY     ??? lancing device Misc USE AS DIRECTED TO CHECK BLOOD SUGAR 1 each 0   ??? magnesium oxide-Mg AA chelate (MAGNESIUM, AMINO ACID CHELATE,) 133 mg Tab Take 2 tablets by mouth Two (2) times a day. 100 tablet PRN   ??? melatonin 3 mg Tab Take 1 tablet (3 mg total) by mouth nightly. 30 tablet 0   ??? mirtazapine (REMERON) 15 MG tablet TAKE 1 TABLET BY MOUTH NIGHTLY 90 tablet 3   ??? mycophenolate (CELLCEPT) 250 mg capsule TAKE 2 CAPSULES (500MG ) BY MOUTH TWICE DAILY 120 capsule 7   ???  NEORAL 100 mg capsule TAKE 1 CAPSULE BY MOUTH TWICE DAILY WITH 3 OF THE 25MG  CAPSULES 60 each 11   ??? NEORAL 25 mg capsule TAKE 3 CAPSULES BY MOUTH TWICE DAILY WITH 100MG  CAPSULE 180 each 11   ??? pen needle, diabetic 32 gauge x 5/32 Ndle USE WITH INSULIN PEN 4 TIMES DAILY AS DIRECTED 100 each 1   ??? pregabalin (LYRICA) 75 MG capsule Take 1 capsule (75 mg total) by mouth Three (3) times a day. 90 capsule 2   ??? sildenafil, antihypertensive, (REVATIO) 20 mg tablet TAKE 2 TABLETS (40 MG TOTAL) BY G-TUBE THREE TIMES DAILY 180 tablet 6   ??? alendronate (FOSAMAX) 70 MG tablet      ??? amoxicillin-clavulanate (AUGMENTIN) 875-125 mg per tablet Take 1 tablet (875 mg total) by mouth 2 (two) times daily Take with food (Patient not taking: Reported on 02/13/2018) 14 tablet 0   ??? azithromycin (ZITHROMAX) 250 MG tablet Take 2 tablets by mouth on day 1, then 2 tablets on day 2 -5. (Patient not taking: Reported on 02/13/2018) 6 tablet 1   ??? blood-glucose meter Misc USE AS DIRECTED TO CHECK BLOOD SUGAR 1 each 0   ??? insulin ASPART (NOVOLOG FLEXPEN U-100 INSULIN) 100 unit/mL injection pen Inject 0.06 mL (6 Units total) under the skin Three (3) times a day before meals. 4 mL 11   ??? nitrofurantoin, macrocrystal-monohydrate, (MACROBID) 100 MG capsule Take 1 capsule (100 mg total) by mouth twice daily until gone. (Patient not taking: Reported on 02/12/2018) 13 capsule 0   ??? polyethylene glycol (GLYCOLAX) 17 gram/dose powder MIX 17 GRAMS (USE MEASURE LINE IN CAP) IN 4-8 OUNCES OF WATER, JUICE, SODA, COFFEE, OR TEA AND DRINK ONCE DAILY (Patient not taking: Reported on 02/13/2018) 527 g 0     No current facility-administered medications for this visit.         Physical Exam:    weight is 72 kg (158 lb 11.2 oz). His oral temperature is 36.6 ??C (97.9 ??F). His blood pressure is 157/63 and his pulse is 78. His oxygen saturation is 100%.   General Appearance:    Alert, cooperative, no distress, appears stated age   Head:    Normocephalic, without obvious abnormality, atraumatic   Eyes:    PERRL, conjunctiva clear, EOM's intact    Oropharynx:   No lesions   Neck:   Supple, trachea midline, no adenopathy;    No JVD   Lungs:     Good breath sounds b/l, no crackles or wheezes, respirations unlabored   Chest Wall:    No tenderness or deformity    Heart:    Regular rate and rhythm, S1 and S2 normal, no murmur, rub   or gallop   Abdomen:     Soft, non-tender, normal bowel sounds, no masses, no organomegaly.  + G tube in place   Extremities:   Extremities normal, no cyanosis, clubbing, or edema   Pulses:   2+ and symmetric all extremities   Skin:   No rashes or lesions   Lymph nodes:   No cervical or supraclavicular adenopathy   Neurologic:   No focal neurological deficits       Diagnostic Review:    Echo 04/21/17:    Left Ventricle The left ventricle is normal in size with normal wall thickness.   The left ventricular systolic function is normal.   The ejection fraction is visually estimated at 65 to 70%.   There is impaired left ventricular relaxation with  normal left ventricular filling pressure consistent with grade I diastolic dysfunction.   Mitral Valve The mitral valve leaflets are normal, with normal leaflet mobility.   There is no significant mitral regurgitation by color and continuous wave Doppler imaging.   Left Atrium The left atrium is dilated.   Aortic Valve The aortic valve is poorly visualized with probably normal excursion.   There is no significant aortic regurgitation by color and continuous wave Doppler imaging.  There is no evidence of a significant transvalvular gradient.   - Peak transvalvular velocity: 1 m/sec   Ascending Aorta The aorta is normal in size in the visualized segments.   Pulmonary Artery The pulmonary artery is not well visualized.   Pulmonic Valve The pulmonic valve is poorly visualized, but probably normal.   There is no significant pulmonic regurgitation by color and continuous wave Doppler imaging.   There is no evidence of a significant transvalvular gradient.   - Peak transvalvular velocity: 0.9 m/sec   Right Ventricle The right ventricle is normal in size with normal wall thickness.   The right ventricular systolic function is normal.   Tricuspid Valve The tricuspid valve leaflets are normal, with normal leaflet mobility.   There is trivial tricuspid regurgitation by color and continuous wave Doppler imaging.   - The Doppler signal is suboptimal and pulmonary artery systolic pressure cannot be accurately estimated.   Right Atrium The right atrium is normal in size.   IVC/SVC The IVC diameter is <=21 mm with >50% decrease in size with inspiration suggesting normal right atrial pressure (0-5 mm Hg).   Pericardium There is no evidence of a significant pericardial effusion.         V/Q scan 01/11/17:    Impression     Normal ventilation and perfusion scan.         CT scan PE protocol performed on 09/2016 post op revealed:    Impression     No CT evidence for acute pulmonary embolism, as clinically questioned.    Moderate bilateral pleural effusions similar to prior CT dated 01/30/2016 with slightly increased atelectasis of the adjacent lung parenchyma in the bilateral lower lobes, left greater than right.    Addendum (Dr.Altun dictating 09/27/2016 08:35 AM) Focal contour bulging of the liver adjacent to the diaphragm and right kidney is noted which is not well evaluated due to the presence of streak artifacts.          Pulmonary Function Test performed on 11/24/15 show:            Consistent with normal spirometry and normal DLCO    6 Minute Walk Test performed on 01/06/17:         Echo performed on 09/27/16:    Echocardiographic Findings     Left Ventricle The left ventricle is normal in size with normal wall thickness.   The left ventricular systolic function is normal.   The ejection fraction is visually estimated at 60 to 65%.   Left ventricular diastolic function cannot be accurately assessed.      Left Atrium The left atrium is normal in size.      Right Ventricle The right ventricle is moderately dilated.   The right ventricular systolic function is decreased.      Tricuspid Valve The tricuspid valve is not well visualized.   There is mild tricuspid regurgitation by color and continuous wave Doppler imaging.   The regurgitation jet is directed toward the septum.   The pulmonary artery systolic  pressure is severely elevated.   - Peak tricuspid regurgitant velocity: 4.6 m/sec  - Estimated pulmonary artery systolic pressure: 80 + RA pressure mmHg      Right Atrium The right atrium is poorly visualized but appears dilated      IVC/SVC Mechanical ventilation precludes the ability to accurately assess right atrial pressure.      Pericardium There is a small anterior pericardial effusion.          [facility] ICU Catheterization performed on date 09/22/16:     Wedge pressure initially looks to be 15-16 but with further balloon deflation this clearly decreases further to 8 mmHg with very good quality tracing. At that time mPAP 50, CVP 9, CO by CCO measurements 4.1/CI 2.2, PVR approximating 10 WU.

## 2018-02-14 MED FILL — AMOXICILLIN 875 MG-POTASSIUM CLAVULANATE 125 MG TABLET: 7 days supply | Qty: 14 | Fill #0

## 2018-02-14 MED FILL — AMOXICILLIN 875 MG-POTASSIUM CLAVULANATE 125 MG TABLET: 7 days supply | Qty: 14 | Fill #0 | Status: AC

## 2018-02-19 NOTE — Unmapped (Addendum)
Called patient and requested them to get labs this week, since there was no recent labs on the patient. Confirmed with patient MRI scheduled for 12-11, but is lumbar MRI, his 18 month f/u liver/abd MRI is scheduled for January 202. Lab orders updated per protocol. Patient wife reported patient is recovering from a chest cough, and is finishing up antibiotics.   All questions and concerns addressed.

## 2018-02-21 ENCOUNTER — Ambulatory Visit: Admit: 2018-02-21 | Discharge: 2018-02-22 | Payer: MEDICARE

## 2018-02-21 DIAGNOSIS — Z79899 Other long term (current) drug therapy: Secondary | ICD-10-CM

## 2018-02-21 DIAGNOSIS — Z981 Arthrodesis status: Secondary | ICD-10-CM

## 2018-02-21 DIAGNOSIS — D899 Disorder involving the immune mechanism, unspecified: Secondary | ICD-10-CM

## 2018-02-21 DIAGNOSIS — M479 Spondylosis, unspecified: Principal | ICD-10-CM

## 2018-02-21 DIAGNOSIS — Z944 Liver transplant status: Secondary | ICD-10-CM

## 2018-02-21 LAB — CYCLOSPORINE, TROUGH: Lab: 137

## 2018-02-21 LAB — COMPREHENSIVE METABOLIC PANEL
ALBUMIN: 3.7 g/dL (ref 3.5–5.0)
ALKALINE PHOSPHATASE: 131 U/L — ABNORMAL HIGH (ref 38–126)
ALT (SGPT): 21 U/L (ref <50)
ANION GAP: 8 mmol/L (ref 7–15)
BILIRUBIN TOTAL: 0.4 mg/dL (ref 0.0–1.2)
BLOOD UREA NITROGEN: 31 mg/dL — ABNORMAL HIGH (ref 7–21)
CALCIUM: 9.6 mg/dL (ref 8.5–10.2)
CHLORIDE: 107 mmol/L (ref 98–107)
CO2: 23 mmol/L (ref 22.0–30.0)
CREATININE: 1.54 mg/dL — ABNORMAL HIGH (ref 0.70–1.30)
EGFR CKD-EPI AA MALE: 52 mL/min/{1.73_m2} — ABNORMAL LOW (ref >=60)
EGFR CKD-EPI NON-AA MALE: 45 mL/min/{1.73_m2} — ABNORMAL LOW (ref >=60)
GLUCOSE RANDOM: 183 mg/dL — ABNORMAL HIGH (ref 65–179)
POTASSIUM: 4.8 mmol/L (ref 3.5–5.0)
PROTEIN TOTAL: 6.4 g/dL — ABNORMAL LOW (ref 6.5–8.3)
SODIUM: 138 mmol/L (ref 135–145)

## 2018-02-21 LAB — CBC W/ AUTO DIFF
BASOPHILS ABSOLUTE COUNT: 0 10*9/L (ref 0.0–0.1)
BASOPHILS RELATIVE PERCENT: 0.4 %
EOSINOPHILS RELATIVE PERCENT: 1.5 %
HEMATOCRIT: 36.7 % — ABNORMAL LOW (ref 41.0–53.0)
HEMOGLOBIN: 12.6 g/dL — ABNORMAL LOW (ref 13.5–17.5)
LARGE UNSTAINED CELLS: 1 % (ref 0–4)
LYMPHOCYTES ABSOLUTE COUNT: 1 10*9/L — ABNORMAL LOW (ref 1.5–5.0)
LYMPHOCYTES RELATIVE PERCENT: 25.2 %
MEAN CORPUSCULAR HEMOGLOBIN CONC: 34.4 g/dL (ref 31.0–37.0)
MEAN CORPUSCULAR HEMOGLOBIN: 31.4 pg (ref 26.0–34.0)
MEAN CORPUSCULAR VOLUME: 91.2 fL (ref 80.0–100.0)
MEAN PLATELET VOLUME: 8.3 fL (ref 7.0–10.0)
MONOCYTES ABSOLUTE COUNT: 0.2 10*9/L (ref 0.2–0.8)
NEUTROPHILS ABSOLUTE COUNT: 2.5 10*9/L (ref 2.0–7.5)
NEUTROPHILS RELATIVE PERCENT: 65.7 %
RED BLOOD CELL COUNT: 4.02 10*12/L — ABNORMAL LOW (ref 4.50–5.90)
RED CELL DISTRIBUTION WIDTH: 14.6 % (ref 12.0–15.0)

## 2018-02-21 LAB — BILIRUBIN DIRECT: Bilirubin.glucuronidated:MCnc:Pt:Ser/Plas:Qn:: <0.1

## 2018-02-21 LAB — MAGNESIUM: Magnesium:MCnc:Pt:Ser/Plas:Qn:: 1.8

## 2018-02-21 LAB — MEAN CORPUSCULAR HEMOGLOBIN: Lab: 31.4

## 2018-02-21 LAB — EGFR CKD-EPI NON-AA MALE: Lab: 45 — ABNORMAL LOW

## 2018-02-21 LAB — GAMMA GLUTAMYL TRANSFERASE: Gamma glutamyl transferase:CCnc:Pt:Ser/Plas:Qn:: 55

## 2018-02-21 LAB — PHOSPHORUS: Phosphate:MCnc:Pt:Ser/Plas:Qn:: 3.2

## 2018-02-21 MED FILL — MG-PLUS-PROTEIN 133 MG TABLET: 25 days supply | Qty: 100 | Fill #2 | Status: AC

## 2018-02-21 MED FILL — NEORAL 100 MG CAPSULE: 30 days supply | Qty: 60 | Fill #2

## 2018-02-21 MED FILL — MG-PLUS-PROTEIN 133 MG TABLET: ORAL | 25 days supply | Qty: 100 | Fill #2

## 2018-02-21 MED FILL — ALENDRONATE 70 MG TABLET: 28 days supply | Qty: 4 | Fill #1 | Status: AC

## 2018-02-21 MED FILL — TRUEPLUS PEN NEEDLE 32 GAUGE X 5/32": 25 days supply | Qty: 100 | Fill #1

## 2018-02-21 MED FILL — ALENDRONATE 70 MG TABLET: ORAL | 28 days supply | Qty: 4 | Fill #1

## 2018-02-21 MED FILL — NEORAL 25 MG CAPSULE: 30 days supply | Qty: 180 | Fill #2 | Status: AC

## 2018-02-21 MED FILL — MYCOPHENOLATE MOFETIL 250 MG CAPSULE: 30 days supply | Qty: 120 | Fill #2 | Status: AC

## 2018-02-21 MED FILL — MYCOPHENOLATE MOFETIL 250 MG CAPSULE: 30 days supply | Qty: 120 | Fill #2

## 2018-02-21 MED FILL — NEORAL 100 MG CAPSULE: 30 days supply | Qty: 60 | Fill #2 | Status: AC

## 2018-02-21 MED FILL — NEORAL 25 MG CAPSULE: 30 days supply | Qty: 180 | Fill #2

## 2018-02-21 MED FILL — TRUEPLUS PEN NEEDLE 32 GAUGE X 5/32": 25 days supply | Qty: 100 | Fill #1 | Status: AC

## 2018-02-23 NOTE — Unmapped (Signed)
Addended by: Valeda Malm on: 02/23/2018 03:58 PM     Modules accepted: Orders

## 2018-02-24 NOTE — Unmapped (Signed)
called and left following message from provider:Please let him know I saw his MRI and referred him to injectionist to talk about injections.       Left VM with information about referral placed.

## 2018-03-02 MED FILL — PREGABALIN 75 MG CAPSULE: 30 days supply | Qty: 90 | Fill #1 | Status: AC

## 2018-03-02 MED FILL — PREGABALIN 75 MG CAPSULE: ORAL | 30 days supply | Qty: 90 | Fill #1

## 2018-03-11 MED ORDER — MG-PLUS-PROTEIN 133 MG TABLET
ORAL_TABLET | Freq: Two times a day (BID) | ORAL | PRN refills | 25 days | Status: CP
Start: 2018-03-11 — End: ?
  Filled 2018-04-05: qty 100, 25d supply, fill #0

## 2018-03-12 MED ORDER — PEN NEEDLE, DIABETIC 32 GAUGE X 5/32" (4 MM)
1 refills | 0 days | Status: CP
Start: 2018-03-12 — End: 2019-03-12

## 2018-03-13 ENCOUNTER — Ambulatory Visit: Admit: 2018-03-13 | Discharge: 2018-03-13 | Payer: MEDICARE

## 2018-03-13 ENCOUNTER — Ambulatory Visit
Admit: 2018-03-13 | Discharge: 2018-03-13 | Payer: MEDICARE | Attending: Student in an Organized Health Care Education/Training Program | Primary: Student in an Organized Health Care Education/Training Program

## 2018-03-13 DIAGNOSIS — Z944 Liver transplant status: Principal | ICD-10-CM

## 2018-03-13 DIAGNOSIS — K219 Gastro-esophageal reflux disease without esophagitis: Secondary | ICD-10-CM

## 2018-03-13 DIAGNOSIS — I272 Pulmonary hypertension, unspecified: Secondary | ICD-10-CM

## 2018-03-13 DIAGNOSIS — Z794 Long term (current) use of insulin: Secondary | ICD-10-CM

## 2018-03-13 DIAGNOSIS — I495 Sick sinus syndrome: Secondary | ICD-10-CM

## 2018-03-13 DIAGNOSIS — R1319 Other dysphagia: Principal | ICD-10-CM

## 2018-03-13 DIAGNOSIS — D689 Coagulation defect, unspecified: Secondary | ICD-10-CM

## 2018-03-13 DIAGNOSIS — E119 Type 2 diabetes mellitus without complications: Secondary | ICD-10-CM

## 2018-03-13 DIAGNOSIS — K225 Diverticulum of esophagus, acquired: Secondary | ICD-10-CM

## 2018-03-13 DIAGNOSIS — Z01818 Encounter for other preprocedural examination: Secondary | ICD-10-CM

## 2018-03-13 DIAGNOSIS — D696 Thrombocytopenia, unspecified: Secondary | ICD-10-CM

## 2018-03-13 LAB — CBC
HEMATOCRIT: 38.9 % — ABNORMAL LOW (ref 41.0–53.0)
HEMOGLOBIN: 12.5 g/dL — ABNORMAL LOW (ref 13.5–17.5)
MEAN CORPUSCULAR HEMOGLOBIN CONC: 32.3 g/dL (ref 31.0–37.0)
MEAN CORPUSCULAR HEMOGLOBIN: 30.4 pg (ref 26.0–34.0)
MEAN PLATELET VOLUME: 9.8 fL (ref 7.0–10.0)
PLATELET COUNT: 106 10*9/L — ABNORMAL LOW (ref 150–440)
RED BLOOD CELL COUNT: 4.13 10*12/L — ABNORMAL LOW (ref 4.50–5.90)
RED CELL DISTRIBUTION WIDTH: 14.4 % (ref 12.0–15.0)
WBC ADJUSTED: 3.8 10*9/L — ABNORMAL LOW (ref 4.5–11.0)

## 2018-03-13 LAB — COMPREHENSIVE METABOLIC PANEL
ALBUMIN: 3.8 g/dL (ref 3.5–5.0)
ALKALINE PHOSPHATASE: 115 U/L (ref 38–126)
ALT (SGPT): 10 U/L (ref <50)
ANION GAP: 6 mmol/L — ABNORMAL LOW (ref 7–15)
AST (SGOT): 14 U/L — ABNORMAL LOW (ref 19–55)
BILIRUBIN TOTAL: 0.5 mg/dL (ref 0.0–1.2)
BLOOD UREA NITROGEN: 45 mg/dL — ABNORMAL HIGH (ref 7–21)
BUN / CREAT RATIO: 23
CALCIUM: 10.3 mg/dL — ABNORMAL HIGH (ref 8.5–10.2)
CHLORIDE: 107 mmol/L (ref 98–107)
CREATININE: 1.94 mg/dL — ABNORMAL HIGH (ref 0.70–1.30)
EGFR CKD-EPI AA MALE: 40 mL/min/{1.73_m2} — ABNORMAL LOW (ref >=60)
EGFR CKD-EPI NON-AA MALE: 34 mL/min/{1.73_m2} — ABNORMAL LOW (ref >=60)
GLUCOSE RANDOM: 130 mg/dL (ref 70–179)
POTASSIUM: 5.3 mmol/L — ABNORMAL HIGH (ref 3.5–5.0)
PROTEIN TOTAL: 6.7 g/dL (ref 6.5–8.3)
SODIUM: 140 mmol/L (ref 135–145)

## 2018-03-13 LAB — SODIUM: Sodium:SCnc:Pt:Ser/Plas:Qn:: 140

## 2018-03-13 LAB — APTT
Coagulation surface induced:Time:Pt:PPP:Qn:Coag: 33.6
HEPARIN CORRELATION: 0.2

## 2018-03-13 LAB — MEAN PLATELET VOLUME: Lab: 9.8

## 2018-03-13 LAB — PROTIME: Lab: 11.5

## 2018-03-13 LAB — PROTIME-INR: INR: 1

## 2018-03-13 NOTE — Unmapped (Addendum)
Per Anesthesia's guidelines:  Please take the following medications the morning of your procedure with a sip of water:  Cellcept,  Neoral,  Lyrica,  Sildenafil.  If needed: tylenol.    The type of anesthesia reviewed for your surgery was General anesthesia. The final plan will be discussed the morning of surgery by your anesthesia care team.  .    Please follow the eating and drinking instructions noted in the pamphlet given in the  Pre-Procedure Services Clinic.  Consider drinking 8 to 12 ounces of a sports drink (gatorade, powerade) at least 2 hours prior to your arrival time for the procedure   Please take only the medications listed in the Pre-Procedure Services Clinic pamphlet and or highlighted on the visit summary

## 2018-03-13 NOTE — Unmapped (Signed)
Otolaryngology     Reason for visit:  Follow up     History of Present Illness:     Recall, initial presentation on 01/30/17 [name] is a 67 y.o. 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 He spent 3 months in the hospital following his transplant he had respiratory compromise and ultimately had tracheostomy tube for much of that stay. He was decannulated in September before discharge home. 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  Patient reports he lost his voice following liver transplant episode 3 months ago. He states that since surgery his voice is breathy and weak. He cannot control his pitch. His voice symptoms do not fluctuate throughout the day and he has not noticed anything that makes it better or worse. He does have a cough throughout the day. He also endorses globus sensation. He denies dysphagia. He is maintained on an oral diet but has not had his G-tube removed yet due to surgeon preference. He has never had voice issues before and has never seen an ENT doctor for any related issues. He does endorse consistent shortness of breath with exertion and notes that this is likely secondary to his recovery process from his liver transplant. She does take Prilosec for his GERD. Thinks that this is stable. No coughing or choking while eating.    He was noted to have a right vocal fold paralysis with significant breathy voice and glottal insufficiency.   For multilpe reasons intervention was delayed.   He has had quite a complicated course.     He did have anemia and hemothorax.     He underwent in office injection cymetra on 05/31/17 0.67ml right fold.     Since then, he has had multiple set backs in his GI care. He had a fall and possible liver abscess. He underwent intubation and ERCP in April. He has been admitted to the hospital twice.     08/08/17: He returns today in follow-up.  He reports no significant improvement in the voice following injection.  He continues to be weak and breathy.  He is also had increasing coughing on foods and liquids.  No pneumonia.  He has had significant illnesses since his injection however as stated above.    He was taken back to [facility] on 09/13/17 for in office [facility] injection TTH cymetra 0.8ml.     10/03/17:   He returns after injection. The voice improved but by only about 50%. The voice has started to decline. He also complains of coughing while eating. This has not changed with injection augmentation. He does state he is louder and easier, but still weak from voice standpoint.    10.21.19:   Follows up for reassessment. Per wife, voice has remained stable since last visit. Improved compared to prior to injection but still with breathiness and vocal fatigue. He was unable to complete voice therpay. He had Hip fracture surgery recently and was intubated for this. Wife states still with some coughing after meals. No regurgitation after meals. Patient states imprpoved.     03/13/18: He comes in today for preop for voice surgery however he states his swallowing has become more of an issue.  He continues to get choked on solid foods and has to regurgitate to swallow things down.  He feels like this is getting worse and is currently a larger issue than the voice.      Past Medical History:  Past Medical History:   Diagnosis Date   ??? Atrial fibrillation (CMS-HCC)    ??? Basal cell carcinoma    ??? Cancer (CMS-HCC)    ??? Cirrhosis (CMS-HCC)    ??? Depression    ??? Diabetes (CMS-HCC)    ??? Headache    ??? Hepatitis C 07/17/2012   ??? History of transfusion    ??? Liver disease    ??? Low back pain 07/17/2012   ??? Varices, esophageal (CMS-HCC)        Past Surgical History:  Past Surgical History:   Procedure Laterality Date   ??? ANKLE SURGERY     ??? BACK SURGERY     ??? BACK SURGERY ??? CARDIAC SURGERY      pacemaker   ??? CHG US GUIDE, TISSUE ABLATION N/A 01/22/2016    Procedure: ULTRASOUND GUIDANCE FOR, AND MONITORING OF, PARENCHYMAL TISSUE ABLATION;  Surgeon: Particia Nearing, MD;  Location: MAIN OR [facility];  Service: Transplant   ??? IR EMBOLIZATION ORGAN ISCHEMIA, TUMORS, INFAR  06/16/2016    IR EMBOLIZATION ORGAN ISCHEMIA, TUMORS, INFAR 06/16/2016 Ammie Dalton, MD IMG VIR H&V [facility]   ??? PR COLON CA SCRN NOT HI RSK IND N/A 02/27/2015    Procedure: COLOREC CNCR SCR;COLNSCPY NO;  Surgeon: Vonda Antigua, MD;  Location: GI PROC[facility]URES [facility];  Service: Gastroenterology   ??? PR ENDOSCOPIC ULTRASOUND EXAM N/A 02/27/2015    Procedure: UGI ENDO; W/ENDO ULTRASOUND EXAM INCLUDES ESOPHAGUS, STOMACH, &/OR DUODENUM/JEJUNUM;  Surgeon: Vonda Antigua, MD;  Location: GI PROCEDURES [facility]ORIAL [facility];  Service: Gastroenterology   ??? PR ERCP BALLOON DILATE BILIARY/PANC DUCT/AMPULLA EA N/A 02/10/2017    Procedure: ERCP;WITH TRANS-ENDOSCOPIC BALLOON DILATION OF BILIARY/PANCREATIC DUCT(S) OR OF AMPULLA, INCLUDING SPHINCTERECTOMY, WHEN PERFOREMD,EACH DUCT (16109);  Surgeon: Mayford Knife, MD;  Location: GI PROCEDURES [facility]ORIAL [facility];  Service: Gastroenterology   ??? PR ERCP REMOVE FOREIGN BODY/STENT BILIARY/PANC DUCT N/A 02/10/2017    Procedure: ENDOSCOPIC RETROGRADE CHOLANGIOPANCREATOGRAPHY (ERCP); W/ REMOVAL OF FOREIGN BODY/STENT FROM BILIARY/PANCREATIC DUCT(S);  Surgeon: Mayford Knife, MD;  Location: GI PROCEDURES MEMORIAL [facility];  Service: Gastroenterology   ??? PR ERCP REMOVE FOREIGN BODY/STENT BILIARY/PANC DUCT N/A 06/29/2017    Procedure: ENDOSCOPIC RETROGRADE CHOLANGIOPANCREATOGRAPHY (ERCP); W/ REMOVAL OF FOREIGN BODY/STENT FROM BILIARY/PANCREATIC DUCT(S);  Surgeon: Vonda Antigua, MD;  Location: GI PROCEDURES [facility];  Service: Gastroenterology   ??? PR ERCP STENT PLACEMENT BILIARY/PANCREATIC DUCT N/A 11/29/2016    Procedure: ENDOSCOPIC RETROGRADE CHOLANGIOPANCREATOGRAPHY (ERCP); WITH PLACEMENT OF ENDOSCOPIC STENT INTO BILIARY OR PANCREATIC DUCT;  Surgeon: Chriss Driver, MD;  Location: GI PROCEDURES [facility];  Service: Gastroenterology   ??? PR ERCP STENT PLACEMENT BILIARY/PANCREATIC DUCT N/A 04/26/2017    Procedure: ENDOSCOPIC RETROGRADE CHOLANGIOPANCREATOGRAPHY (ERCP); WITH PLACEMENT OF ENDOSCOPIC STENT INTO BILIARY OR PANCREATIC DUCT;  Surgeon: Mayford Knife, MD;  Location: GI PROCEDURES [facility];  Service: Gastroenterology   ??? PR ERCP,W/REMOVAL STONE,BIL/PANCR DUCTS N/A 11/29/2016    Procedure: ERCP; W/ENDOSCOPIC RETROGRADE REMOVAL OF CALCULUS/CALCULI FROM BILIARY &/OR PANCREATIC DUCTS;  Surgeon: Chriss Driver, MD;  Location: GI PROCEDURES [facility];  Service: Gastroenterology   ??? PR ERCP,W/REMOVAL STONE,BIL/PANCR DUCTS N/A 02/10/2017    Procedure: ERCP; W/ENDOSCOPIC RETROGRADE REMOVAL OF CALCULUS/CALCULI FROM BILIARY &/OR PANCREATIC DUCTS;  Surgeon: Mayford Knife, MD;  Location: GI PROCEDURES [facility];  Service: Gastroenterology   ??? PR ERCP,W/REMOVAL STONE,BIL/PANCR DUCTS N/A 04/26/2017  Procedure: ERCP; W/ENDOSCOPIC RETROGRADE REMOVAL OF CALCULUS/CALCULI FROM BILIARY &/OR PANCREATIC DUCTS;  Surgeon: Mayford Knife, MD;  Location: GI PROCEDURES MEMORIAL [facility];  Service: Gastroenterology   ??? PR ERCP,W/REMOVAL STONE,BIL/PANCR DUCTS N/A 06/29/2017    Procedure: ERCP; W/ENDOSCOPIC RETROGRADE REMOVAL OF CALCULUS/CALCULI FROM BILIARY &/OR PANCREATIC DUCTS;  Surgeon: Vonda Antigua, MD;  Location: GI PROCEDURES [facility];  Service: Gastroenterology   ??? PR INSER HEART TEMP PACER ONE CHMBR N/A 10/02/2016    Procedure: Tempoarary Pacemaker Insertion;  Surgeon: Meredith Leeds, MD;  Location: [facility];  Service: Cardiology   ??? PR LAP,DIAGNOSTIC ABDOMEN N/A 01/22/2016    Procedure: Laparoscopy, Abdomen, Peritoneum, & Omentum, Diagnostic, W/Wo Collection Specimen(S) By Brushing Or Washing;  Surgeon: Particia Nearing, MD;  Location: MAIN OR [facility];  Service: Transplant   ??? PR PLACE PERCUT GASTROSTOMY TUBE N/A 11/17/2016    Procedure: UGI ENDO; W/DIRECTED PLCMT PERQ GASTROSTOMY TUBE;  Surgeon: Cletis Athens, MD;  Location: [facility] PROCEDURES [facility];  Service: Gastroenterology   ??? PR TRACHEOSTOMY, PLANNED N/A 09/29/2016    Procedure: TRACHEOSTOMY PLANNED (SEPART PROC);  Surgeon: Katherina Mires, MD;  Location: MAIN OR [facility];  Service: Trauma   ??? PR TRANSCATH INSERT OR REPLACE LEADLESS PM VENTR N/A 10/11/2016    Procedure: Pacemaker Implant/Replace Leadless;  Surgeon: Meredith Leeds, MD;  Location: [facility];  Service: Cardiology   ??? PR TRANSPLANT LIVER,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    Procedure: Liver Allotransplantation; Orthotopic, Partial Or Whole, From Cadaver Or Living Donor, Any Age;  Surgeon: Doyce Loose, MD;  Location: MAIN OR [facility];  Service: Transplant   ??? PR TRANSPLANT,PREP DONOR LIVER, WHOLE N/A 09/15/2016    Procedure: Rogelia Boga Std Prep Cad Donor Whole Liver Gft Prior Tnsplnt,Inc Chole,Diss/Rem Surr Tissu Wo Triseg/Lobe Splt;  Surgeon: Doyce Loose, MD;  Location: MAIN OR [facility];  Service: Transplant   ??? PR UPPER GI ENDOSCOPY,BIOPSY N/A 07/17/2012    Procedure: UGI ENDOSCOPY; WITH BIOPSY, SINGLE OR MULTIPLE;  Surgeon: Alba Destine, MD;  Location: GI PROCEDURES [facility];  Service: Gastroenterology   ??? PR UPPER GI ENDOSCOPY,DIAGNOSIS N/A 02/04/2014    Procedure: UGI ENDO, INCLUDE ESOPHAGUS, STOMACH, & DUODENUM &/OR JEJUNUM; DX W/WO COLLECTION SPECIMN, BY BRUSH OR WASH;  Surgeon: Wilburt Finlay, MD;  Location: GI PROCEDURES [facility];  Service: Gastroenterology   ??? PR UPPER GI ENDOSCOPY,LIGAT VARIX N/A 11/05/2013    Procedure: UGI ENDO; W/BAND LIG ESOPH &/OR GASTRIC VARICES;  Surgeon: Wilburt Finlay, MD;  Location: GI PROCEDURES [facility];  Service: Gastroenterology       Medications:    Current Outpatient Medications:   ???  alendronate (FOSAMAX) 70 MG tablet, , Disp: , Rfl:   ???  alendronate (FOSAMAX) 70 MG tablet, Take 1 tablet (70 mg total) by mouth every seven (7) days., Disp: 4 tablet, Rfl: 11  ???  amoxicillin-clavulanate (AUGMENTIN) 875-125 mg per tablet, Take 1 tablet (875 mg total) by mouth 2 (two) times daily Take with food, Disp: 14 tablet, Rfl: 0  ???  bisacodyl (DULCOLAX, BISACODYL,) 5 mg EC tablet, Take 5 mg by mouth., Disp: , Rfl:   ???  blood sugar diagnostic Strp, Use three (3) times a day before meals., Disp: 100 each, Rfl: 7  ???  blood-glucose meter (GLUCOSE MONITORING KIT) kit, Use as instructed, Disp: 1 each, Rfl: 0  ???  calcium carbonate (OS-CAL) 500 mg calcium (1,250 mg) chewable tablet, Chew., Disp: , Rfl:   ???  cycloSPORINE modified (NEORAL) 100 MG capsule,  Take 100 mg by mouth., Disp: , Rfl:   ???  docusate sodium (COLACE) 100 MG capsule, TAKE 1 CAPSULE BY MOUTH TWICE DAILY, Disp: 60 capsule, Rfl: 0  ???  ferrous sulfate 325 (65 FE) MG tablet, Take 325 mg by mouth., Disp: , Rfl:   ???  insulin glargine (BASAGLAR, LANTUS) 100 unit/mL (3 mL) injection pen, INJECT 12 UNITS SUBCUTANEOUSLY (UNDER SKIN) NIGHTLY, Disp: 15 mL, Rfl: 10  ???  insulin glargine (BASAGLAR, LANTUS) 100 unit/mL (3 mL) injection pen, INJECT 12 UNITS SUBCUTANEOUSLY (UNDER SKIN) NIGHTLY, Disp: , Rfl:   ???  magnesium oxide-Mg AA chelate (MAGNESIUM, AMINO ACID CHELATE,) 133 mg Tab, Take 2 tablets by mouth Two (2) times a day., Disp: 100 tablet, Rfl: PRN  ???  melatonin 3 mg Tab, Take 1 tablet (3 mg total) by mouth nightly., Disp: 30 tablet, Rfl: 0  ???  mirtazapine (REMERON) 15 MG tablet, TAKE 1 TABLET BY MOUTH NIGHTLY, Disp: 90 tablet, Rfl: 3  ???  mycophenolate (CELLCEPT) 250 mg capsule, TAKE 2 CAPSULES (500MG ) BY MOUTH TWICE DAILY, Disp: 120 capsule, Rfl: 7  ???  NEORAL 100 mg capsule, TAKE 1 CAPSULE BY MOUTH TWICE DAILY WITH 3 OF THE 25MG  CAPSULES, Disp: 60 each, Rfl: 11  ???  NEORAL 25 mg capsule, TAKE 3 CAPSULES BY MOUTH TWICE DAILY WITH 100MG  CAPSULE, Disp: 180 each, Rfl: 11  ???  pen needle, diabetic 32 gauge x 5/32 Ndle, USE WITH INSULIN PEN 4 TIMES DAILY AS DIRECTED, Disp: 100 each, Rfl: 1  ???  polyethylene glycol (GLYCOLAX) 17 gram/dose powder, MIX 17 GRAMS (USE MEASURE LINE IN CAP) IN 4-8 OUNCES OF WATER, JUICE, SODA, COFFEE, OR TEA AND DRINK ONCE DAILY, Disp: 527 g, Rfl: 0  ???  pregabalin (LYRICA) 75 MG capsule, Take 1 capsule (75 mg total) by mouth Three (3) times a day., Disp: 90 capsule, Rfl: 2  ???  sildenafil, antihypertensive, (REVATIO) 20 mg tablet, TAKE 2 TABLETS (40 MG TOTAL) BY G-TUBE THREE TIMES DAILY, Disp: 180 tablet, Rfl: 6  ???  azithromycin (ZITHROMAX) 250 MG tablet, Take 2 tablets by mouth on day 1, then 2 tablets on day 2 -5. (Patient not taking: Reported on 03/13/2018), Disp: 6 tablet, Rfl: 1  ???  blood-glucose meter Misc, USE AS DIRECTED TO CHECK BLOOD SUGAR, Disp: 1 each, Rfl: 0  ???  insulin ASPART (NOVOLOG FLEXPEN U-100 INSULIN) 100 unit/mL (3 mL) injection pen, Inject 6 Units under the skin., Disp: , Rfl:   ???  insulin ASPART (NOVOLOG FLEXPEN U-100 INSULIN) 100 unit/mL injection pen, Inject 0.06 mL (6 Units total) under the skin Three (3) times a day before meals., Disp: 4 mL, Rfl: 11  ???  lancing device Misc, USE AS DIRECTED TO CHECK BLOOD SUGAR, Disp: 1 each, Rfl: 0  ???  nitrofurantoin, macrocrystal-monohydrate, (MACROBID) 100 MG capsule, Take 1 capsule (100 mg total) by mouth twice daily until gone. (Patient not taking: Reported on 03/13/2018), Disp: 13 capsule, Rfl: 0     Allergies:  No Known Allergies    Family History:  Family History   Problem Relation Age of Onset   ??? Hypertension Mother    ??? Cirrhosis Neg Hx    ??? Liver cancer Neg Hx    ??? Anemia Neg Hx    ??? Cancer Neg Hx    ??? Diabetes Neg Hx    ??? Kidney disease Neg Hx    ??? Obesity Neg Hx    ??? Thyroid disease Neg Hx    ???  Osteoporosis Neg Hx    ??? Coronary artery disease Neg Hx    ??? Anesthesia problems Neg Hx    ??? Basal cell carcinoma Neg Hx    ??? Squamous cell carcinoma Neg Hx      Social History:  Social History     Tobacco Use   ??? Smoking status: Never Smoker   ??? Smokeless tobacco: Never Used   ??? Tobacco comment: Smoked in high school for about 5 years.    Substance Use Topics   ??? Alcohol use: No   ??? Drug use: No       Review of Systems:  Denies chest pain, sob, fevers, n/v, abd pain      Physical Exam:   Constitutional:  Vitals reviewed in chart, patient has normal appearance. Well nourished, well-developed, no acute distress  Voice:  Hoarse, breathy, weak voice  Respiration:  Breathing comfortably, no stridor.  CV: No clubbing/cyanosis/edema in hands.   Eyes:  extraocular motion intact, sclera normal.   Neuro:  Alert and oriented times 3, Cranial nerves 2-12 intact and symmetric bilaterally. Except X.  Head and Face:  Skin with no masses or lesions, sinuses nontender to palpation, facial nerve fully intact.   Salivary Glands:  Parotid and submandibular glands normal bilaterally.   Ears:  Normal external ears  Nose:  External nose midline, anterior rhinoscopy is normal with limited visualization just to the anterior interior turbinate.   Oral Cavity/Oropharynx/Lips:  Normal mucous membranes, normal floor of mouth/tongue/oropharynx, no masses or lesions are noted.  Good dentition  Pharyngeal Walls:  No masses noted.  Neck/Lymph:  No lymphadenopathy, no thyroid masses. Previous tracheostoma still patent with moderate granulation tissue filling the fistula. No erythema or drainage or purulence noted.   Larynx: Mirror evaluation insufficient to visualize secondary to prominent gag      Voice- Related Quality of Life (VR-QOL) Measure:  ?? I have trouble speaking loudly or being heard in noisy situations: 5  ?? I run out of air and need to take frequent breaths when talking: 3  ?? I sometimes do not know what will come out when I begin speaking: 3  ?? I am sometimes anxious or frustrated because of my voice: 4  ?? I sometimes get depressed because of my voice: 4  ?? I have trouble using the telephone because of my voice: 4  ?? I have trouble doing my job or practicing my profession because of my voice: 1  ?? I avoid going out socially because of my voice: 4  ?? I have to repeat myself to be understood: 5  ?? I have become less outgoing because of my voice: 4  VRQOL Raw Score: 37  Calculated Score: 32.5    Glottal Function Index:  ?? Speaking took extra effort: 5  ?? Throat discomfort or pain after using your voice: 4  ?? Vocal fatigue (voice weakened as you talked): 4  ?? Voice cracks or sounds different: 5  Glottal Function Index Total: 18     Eating Assessment Tool (EAT-10):  ?? My swallowing problem has caused me to lose weight: 0  ?? My swallowing problem interferes with my ability to go out for meals: 0  ?? Swallowing liquids takes extra effort: 1  ?? Swallowing solids takes extra effort: 1  ?? Swallowing pills takes extra effort: 0  ?? Swallowing is painful: 0  ?? The pleasure of eating is affected by my swallowing: 1  ?? When I swallow food sticks in  my throat: 1  ?? I cough when I eat: 3  ?? Swallowing is stressful: 3  EAT-10 Total: 10        MBS: 08/29/17:   Assessment: Pt presents with a mild pharyngeal dysphagia c/b reduced BOT retraction which led to mild vallecula residue (all consistencies). Trace post-cricoid residue of solids. Trace penetration x 1 of thin liquids before swallows (cleared). No aspiration observed. Cervical esophagus: mild accumulation of boluses at tissue protrusion (~C6) c/w the appearance of a small Zenker's diverticulum. Occasional mild backflow of boluses from this area.     Assessment:   [name] is a 67 y.o. 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  He  has a past medical history of Atrial fibrillation (CMS-HCC), Basal cell carcinoma, Cancer (CMS-HCC), Cirrhosis (CMS-HCC), Depression, Diabetes (CMS-HCC), Headache, Hepatitis C (07/17/2012), History of transfusion, Liver disease, Low back pain (07/17/2012), and Varices, esophageal (CMS-HCC).    Findings:   dypsphonia right vocal fold immobility   Glottic insufficiency   --all temporally related to liver transplant transplant. (prolonged intubation, trach possible etiologies)   05/31/17 Status post in office injection augmentation with Cymetra in the [facility]   Significantly complicated history surrounding injection including hemothorax and anemia prior to injection followed by readmission x2 for fall rib pain and intubation for ERCP.  S/p [facility] on 09/13/17 for in office [facility] injection TTH cymetra 0.55ml.   Small Zenker diverticulum    Plan:  We had originally planned for surgery in January for his voice however given that his swallowing has become more of an issue we will change this we will keep the same operating room date but we will address his swallowing instead.      plan for endoscopic CO2 laser Zenker diverticulectomy  with Botox injection under general anesthesia, possible open approach only if unable to expose.     The risks, benefits, and alternatives of endoscopic Zenker diverticulotomy were explained in detail, which include but are not limited to: risk of general anesthesia (stroke, heart attack, death), dysphagia, sore throat, esophageal perforation, failure to treat, need for repeated or additional procedures, bleeding, dental trauma, neck/shoulder pain, transient numbness or weakness of the tongue, transient disturbances in taste, pain and transient disturbances with speech and swallowing.    There is also a potential for requiring an open appraoch to Zenker diverticulectomy with cricopharyngeal myotomy.  This approach would have additional risks of bleeding, infection, seroma, hematoma, and nerve injury including risk to the RLN that would result in hoarseness, esophageal perforation, and other unexpected risks. He understands these risks and wishes to proceed.     We also discussed that we may photograph or film parts of the patient's case, radiography and care.?? These may be used for teaching or communicating with other physicians.?? Verbal consent was given for this. We also discussed that I work as a Chiropodist and medical students that will be a part of the patient's care.       He does have Afib with pacemaker - on ASA also has low platelets; has insulin-dep DM; immunosuppresion --> needs pre-op anesthesia eval and medical/pulm/cardiac clearance-  Optimized for surgery?   Ok to stop aspirin?   What to do if low platelets day of surgery?

## 2018-03-15 ENCOUNTER — Ambulatory Visit: Admit: 2018-03-15 | Discharge: 2018-03-16 | Payer: MEDICARE

## 2018-03-15 DIAGNOSIS — Z01818 Encounter for other preprocedural examination: Principal | ICD-10-CM

## 2018-03-15 DIAGNOSIS — Z950 Presence of cardiac pacemaker: Secondary | ICD-10-CM

## 2018-03-15 NOTE — Unmapped (Signed)
Recommendations for this procedure:  Use Bipolar electrocautery if needed.  Patient is not-dependent. RV pacing <5% at lower rate of 50 bpm.  Leadless pacemaker implanted in Right Ventricle.  This device does not have a Magnet response however based on patient Hx and location of pacemaker in relation to surgery, no intervention is required on DOS.  Device will not need to be checked before or after surgery.  Questions, please call the Ep Device at [phone] on Day of surgery.    Perioperative Cardiac Implanted Device evaluation  Procedure: ENT Neck Surgery  Location of Device: Right Ventricle  Device Manufacturer/Type: Medtronic Micra Vr TCP  Underlying Rhythm: SR  Dependent:  NO  Mode: VVIR  Lower Rate: 50    Bipolar Cautery:  NO CHANGES NEEDED      Questions during the case, please call the EP device RN at [phone]  After hours, CICU Fellow

## 2018-03-15 NOTE — Unmapped (Signed)
Patient is approved for MRI.  Patient has a MRI conditional device    GENERATOR INFORMATION  Manufacturer & Model #    Medtronic Micra VR TCP [id number]    LEAD INFORMATION  Abandoned/Epicardial Lead(s)  No   CXR verified             Prescreening Requirements  *The patient has no implanted lead extenders, lead adaptors, or abandoned leads  *The patient has no broken leads or leads with intermittent electrical contact, as confirmed by lead impedance history >200 ohms and <1500 ohms  *Implanted for more than 6 weeks-unless EP Attending approval  *Device is operating within the projected service life-Cannot be done if EOS, ERI, near ERI without Ep Attending approval  *For patients whose device will be programmed to an asynchronous pacing mode when the MRI SureScan mode is programmed to On, no diaphragmatic stimulation is present when the paced leads have a pacing output of 5.0 V and a pulse width of 1.0 ms.  *Pacing capture thresholds ? 2.0 V at 0.4 ms-if dependent (pacing left on)        MR CONDITIONAL SYSTEM      Device will be programmed under the direction of the Ep Attending by the Ep Device RN  Mode VVIR Lower Rate 50 bpm Upper Sensor 120 bpm

## 2018-03-16 NOTE — Unmapped (Signed)
From: Doyce Loose, MD   Sent: 03/15/2018 ??10:55 AM EST   To: Vista Deck, MD, Jettie Booze, MD, *   Subject: RE: preop clearance. ?? ?? ?? ?? ?? ?? ?? ?? ?? ?? ?? ??     Hi,   Its ok from a transplant surgery standpoint to go ahead with surgery. He can come of aspirin and restart postop as long as cardiology approves. If platelets are above 20,000 he should be fine for a procedure, if you encounter bleeding you can give platelets.

## 2018-03-16 NOTE — Unmapped (Signed)
Meredith Leeds, MD  Doyce Loose, MD; Vista Deck, MD; Jettie Booze, MD      ??      OK to stop ASA preop and restart postop from EP perspective. ??I recommend stopping 7 days preop.

## 2018-03-19 ENCOUNTER — Institutional Professional Consult (permissible substitution): Admit: 2018-03-19 | Discharge: 2018-03-20 | Payer: MEDICARE

## 2018-03-19 DIAGNOSIS — R001 Bradycardia, unspecified: Secondary | ICD-10-CM

## 2018-03-19 DIAGNOSIS — Z45018 Encounter for adjustment and management of other part of cardiac pacemaker: Principal | ICD-10-CM

## 2018-03-19 MED ORDER — MYCOPHENOLATE MOFETIL 250 MG CAPSULE
ORAL_CAPSULE | 7 refills | 0 days | Status: CP
Start: 2018-03-19 — End: 2019-03-19
  Filled 2018-04-05: qty 120, 30d supply, fill #0

## 2018-03-19 NOTE — Unmapped (Signed)
Cardiac Implantable Electronic Device Remote Monitoring     Visit Date:  03/19/2018    Findings: Normal device function, Tested Lead measurements stable and within normal range, Adequate battery reserve    Arrhythmias: No significant new atrial or ventricular arrhythmias    Plan: Continue Routine Remote Monitoring   __________________________________________________________________    Manufacturer of Device: Medtronic       Type of Device: Single Chamber Pacemaker  See scanned/downloaded PDF report for model numbers, serial numbers, and date(s) of implant.    Presenting Rhythm: VP    ______________________________________________________________________    Heart Failure Monitoring: Not Applicable  ______________________________________________________________________      Please see downloaded PDF file of transmission under Media Tab  for full details of device interrogation to include, when applicable,  battery status/charge time, lead trend data, and programmed parameters.

## 2018-03-19 NOTE — Unmapped (Signed)
Called patient to discuss scheduling 18 month MRI. Spoke with patients wife, she confirmed they can come on 1/14 at 10am for MRI at [facility] Inbasket message sent to cliff to schedule patient at this time.

## 2018-03-20 NOTE — Unmapped (Signed)
Addendum  created 03/20/18 0802 by Rose Fillers, MD    Clinical Note Signed

## 2018-03-20 NOTE — Unmapped (Signed)
Spoke to patients wife regarding patient elevated Cr. And K+ of 5.3  Gave diet education. Patient wife verbalizes understanding. Pt. Wife denies patient drinking any supplements or ensures.     After reviewing labs with NP Martin-Velez, confrimed this is his baseline and kidney fuction. Patient is having issues swallowing and is being seen by ENT for a barium swallow, upcoming procedure is scheduled with ENT.     No interventions at this time.

## 2018-03-20 NOTE — Unmapped (Signed)
Addendum  created 03/19/18 2204 by Georgeanna Lea, MD    Clinical Note Signed

## 2018-03-23 ENCOUNTER — Ambulatory Visit: Admit: 2018-03-23 | Discharge: 2018-03-24 | Payer: MEDICARE

## 2018-03-23 DIAGNOSIS — R1319 Other dysphagia: Principal | ICD-10-CM

## 2018-03-23 NOTE — Unmapped (Signed)
I have reviewed and agree with the above battery, threshold, lead impedance, and overall device/lead status, diagnostic data including arrhythmic events and therapies on the device evaluation.    Joban Colledge, MD

## 2018-03-27 DIAGNOSIS — C220 Liver cell carcinoma: Secondary | ICD-10-CM

## 2018-03-27 DIAGNOSIS — Z944 Liver transplant status: Principal | ICD-10-CM

## 2018-03-27 DIAGNOSIS — Z8505 Personal history of malignant neoplasm of liver: Secondary | ICD-10-CM

## 2018-03-27 DIAGNOSIS — D899 Disorder involving the immune mechanism, unspecified: Secondary | ICD-10-CM

## 2018-03-27 DIAGNOSIS — Z79899 Other long term (current) drug therapy: Secondary | ICD-10-CM

## 2018-03-27 LAB — CBC W/ AUTO DIFF
BASOPHILS ABSOLUTE COUNT: 0 10*9/L (ref 0.0–0.1)
BASOPHILS RELATIVE PERCENT: 0.3 %
EOSINOPHILS RELATIVE PERCENT: 2.5 %
HEMOGLOBIN: 12.9 g/dL — ABNORMAL LOW (ref 13.5–17.5)
LARGE UNSTAINED CELLS: 2 % (ref 0–4)
LYMPHOCYTES ABSOLUTE COUNT: 0.9 10*9/L — ABNORMAL LOW (ref 1.5–5.0)
LYMPHOCYTES RELATIVE PERCENT: 24.4 %
MEAN CORPUSCULAR HEMOGLOBIN CONC: 32.8 g/dL (ref 31.0–37.0)
MEAN CORPUSCULAR HEMOGLOBIN: 30.4 pg (ref 26.0–34.0)
MEAN CORPUSCULAR VOLUME: 92.8 fL (ref 80.0–100.0)
MEAN PLATELET VOLUME: 9 fL (ref 7.0–10.0)
MONOCYTES ABSOLUTE COUNT: 0.2 10*9/L (ref 0.2–0.8)
MONOCYTES RELATIVE PERCENT: 6.5 %
NEUTROPHILS ABSOLUTE COUNT: 2.4 10*9/L (ref 2.0–7.5)
NEUTROPHILS RELATIVE PERCENT: 64.9 %
PLATELET COUNT: 93 10*9/L — ABNORMAL LOW (ref 150–440)
RED CELL DISTRIBUTION WIDTH: 15 % (ref 12.0–15.0)
WBC ADJUSTED: 3.7 10*9/L — ABNORMAL LOW (ref 4.5–11.0)

## 2018-03-27 LAB — AFP-TUMOR MARKER: Alpha-1-Fetoprotein.tumor marker:MCnc:Pt:Ser/Plas:Qn:: 2

## 2018-03-27 LAB — COMPREHENSIVE METABOLIC PANEL
ALBUMIN: 3.9 g/dL (ref 3.5–5.0)
ALKALINE PHOSPHATASE: 102 U/L (ref 38–126)
ALT (SGPT): 10 U/L (ref <50)
ANION GAP: 7 mmol/L (ref 7–15)
BILIRUBIN TOTAL: 0.5 mg/dL (ref 0.0–1.2)
BLOOD UREA NITROGEN: 40 mg/dL — ABNORMAL HIGH (ref 7–21)
BUN / CREAT RATIO: 24
CALCIUM: 10.6 mg/dL — ABNORMAL HIGH (ref 8.5–10.2)
CHLORIDE: 105 mmol/L (ref 98–107)
CREATININE: 1.69 mg/dL — ABNORMAL HIGH (ref 0.70–1.30)
EGFR CKD-EPI AA MALE: 47 mL/min/{1.73_m2} — ABNORMAL LOW (ref >=60)
EGFR CKD-EPI NON-AA MALE: 41 mL/min/{1.73_m2} — ABNORMAL LOW (ref >=60)
GLUCOSE RANDOM: 157 mg/dL (ref 65–179)
POTASSIUM: 4.9 mmol/L (ref 3.5–5.0)
PROTEIN TOTAL: 6.4 g/dL — ABNORMAL LOW (ref 6.5–8.3)
SODIUM: 137 mmol/L (ref 135–145)

## 2018-03-27 LAB — BILIRUBIN DIRECT: Bilirubin.glucuronidated:MCnc:Pt:Ser/Plas:Qn:: 0.1

## 2018-03-27 LAB — CO2: Carbon dioxide:SCnc:Pt:Ser/Plas:Qn:: 25

## 2018-03-27 LAB — MAGNESIUM: Magnesium:MCnc:Pt:Ser/Plas:Qn:: 1.9

## 2018-03-27 LAB — PHOSPHORUS
PHOSPHORUS: 3.3 mg/dL (ref 2.9–4.7)
Phosphate:MCnc:Pt:Ser/Plas:Qn:: 3.3

## 2018-03-27 LAB — MEAN PLATELET VOLUME: Lab: 9

## 2018-03-27 LAB — GAMMA GT: GAMMA GLUTAMYL TRANSFERASE: 39 U/L (ref 12–109)

## 2018-03-27 LAB — GAMMA GLUTAMYL TRANSFERASE: Gamma glutamyl transferase:CCnc:Pt:Ser/Plas:Qn:: 39

## 2018-03-28 ENCOUNTER — Ambulatory Visit: Admit: 2018-03-28 | Discharge: 2018-03-28 | Payer: MEDICARE

## 2018-03-28 LAB — CYCLOSPORINE, TROUGH: Lab: 65 — ABNORMAL LOW

## 2018-04-03 DIAGNOSIS — K225 Diverticulum of esophagus, acquired: Principal | ICD-10-CM

## 2018-04-03 NOTE — Unmapped (Signed)
[facility] Refill Coordination Note  Specialty Medication(s): mycophenolate 250mg  capsules, Neoral 25 and 100mg  capsules  Additional Medications shipped: magnesium AA chelate 133mg , and sildenafil 20mg     Jeffrey Ward, DOB: 18-Nov-1950  Phone: [phone] (home) , Alternate phone contact: N/A  Phone or address changes today?: No  All above HIPAA information was verified with patient's family member.  Shipping Address: [address] [place] [name]  [address]  [place] [address]   Insurance changes? No    Completed refill call assessment today to schedule patient's medication shipment from the [facility] [phone]).      Confirmed the medication and dosage are correct and have not changed: Yes, regimen is correct and unchanged.    Confirmed patient started or stopped the following medications in the past month:  No, there are no changes reported at this time.    Are you tolerating your medication?:  Jeffrey Ward reports tolerating the medication.    ADHERENCE    Mycophenolate Mofetil 250 mg      # of capsules left on hand: 28 capsules    Neoral 25 mg      # of capsules left on hand: 60 capsules    Neoral 100 mg     # of capsules left on hand: 20 capsules    Did you miss any doses in the past 4 weeks? No missed doses reported.    FINANCIAL/SHIPPING    Delivery Scheduled: Yes, Expected medication delivery date: 04/06/2018     Medication will be delivered via Next Day Courier to the prescription address in [facility].    The patient will receive a drug information handout for each medication shipped and additional FDA Medication Guides as required.      Jeffrey Ward did not have any additional questions at this time.    We will follow up with patient monthly for standard refill processing and delivery.      Thank you,  Roderic Palau   [facility] Specialty Pharmacist

## 2018-04-04 ENCOUNTER — Encounter: Admit: 2018-04-04 | Discharge: 2018-04-05 | Payer: MEDICARE

## 2018-04-04 ENCOUNTER — Ambulatory Visit: Admit: 2018-04-04 | Discharge: 2018-04-05 | Payer: MEDICARE

## 2018-04-04 DIAGNOSIS — K225 Diverticulum of esophagus, acquired: Principal | ICD-10-CM

## 2018-04-04 LAB — CBC
HEMATOCRIT: 36.3 % — ABNORMAL LOW (ref 41.0–53.0)
HEMOGLOBIN: 12.1 g/dL — ABNORMAL LOW (ref 13.5–17.5)
MEAN CORPUSCULAR HEMOGLOBIN CONC: 33.4 g/dL (ref 31.0–37.0)
MEAN CORPUSCULAR HEMOGLOBIN: 30.9 pg (ref 26.0–34.0)
MEAN CORPUSCULAR VOLUME: 92.5 fL (ref 80.0–100.0)
MEAN PLATELET VOLUME: 9.4 fL (ref 7.0–10.0)
PLATELET COUNT: 90 10*9/L — ABNORMAL LOW (ref 150–440)
RED BLOOD CELL COUNT: 3.93 10*12/L — ABNORMAL LOW (ref 4.50–5.90)
WBC ADJUSTED: 3.9 10*9/L — ABNORMAL LOW (ref 4.5–11.0)

## 2018-04-04 LAB — PLATELET COUNT: Lab: 90 — ABNORMAL LOW

## 2018-04-04 NOTE — Unmapped (Signed)
Denies need for pain medication. Ambulates with assistance. No falls this shift. VSS. Will continue to monitor.   Problem: Adult Inpatient Plan of Care  Goal: Plan of Care Review  Outcome: Ongoing - Unchanged  Goal: Patient-Specific Goal (Individualization)  Outcome: Ongoing - Unchanged  Goal: Absence of Hospital-Acquired Illness or Injury  Outcome: Ongoing - Unchanged  Goal: Optimal Comfort and Wellbeing  Outcome: Ongoing - Unchanged  Goal: Readiness for Transition of Care  Outcome: Ongoing - Unchanged  Goal: Rounds/Family Conference  Outcome: Ongoing - Unchanged     Problem: Fall Injury Risk  Goal: Absence of Fall and Fall-Related Injury  Outcome: Ongoing - Unchanged

## 2018-04-04 NOTE — Unmapped (Signed)
Otolaryngology/Head and Neck Surgery Operative Note    Date of Surgery: 04/04/2018    Pre-op Diagnosis:   1. Zenkers diverticulum  Patient Active Problem List   Diagnosis   ??? Hepatitis C   ??? Low back pain   ??? Hematuria   ??? Calculus of ureter   ??? Anemia   ??? Benign prostatic hyperplasia   ??? Chronic pain disorder   ??? Dysrhythmia   ??? History of gastroesophageal reflux (GERD)   ??? History of hepatitis C virus infection   ??? History of substance abuse (CMS-HCC)   ??? Hyperglycemia, unspecified   ??? Intermittent vertigo   ??? Microscopic hematuria   ??? Postlaminectomy syndrome, lumbar region   ??? Thrombocytopenia (CMS-HCC)   ??? Vitamin D deficiency   ??? Atrial fibrillation with RVR (CMS-HCC)   ??? Secondary esophageal varices (CMS-HCC)   ??? Right ventricular dilation   ??? End-stage liver disease (CMS-HCC)   ??? HCC (hepatocellular carcinoma) (CMS-HCC)   ??? Paroxysmal atrial fibrillation (CMS-HCC)   ??? Hepatic encephalopathy (CMS-HCC)   ??? S/P liver transplant (CMS-HCC)   ??? Pulmonary hypertension, moderate to severe (CMS-HCC)   ??? Acute right heart failure (CMS-HCC)   ??? Tracheostomy dependent (CMS-HCC)   ??? Mixed level of activity delirium due to multiple etiologies, resolved   ??? Insomnia   ??? Peripheral neuropathy   ??? Influenza A   ??? Hemothorax   ??? Gastrostomy tube in place (CMS-HCC)   ??? H/O tracheostomy   ??? S/P placement of cardiac pacemaker   ??? Uncontrolled type 2 diabetes mellitus without complication, with long-term current use of insulin (CMS-HCC)   ??? Vocal cord paralysis   ??? Zenker's diverticulum   ??? Medtronic Mirca VR TCP Leadless Pacemaker       Post-op Diagnosis:   same    Procedures performed:  1. Endoscopic CO2 laser Zenker Diverticulotomy (CPT  43180)  2. Microlaryngoscopy with botox injection of the cricopharyngeus (CPT 9852532228)    Surgeons: Surgeon(s) and Role:     * Jeffrey Neils Harvie Bridge, MD - Primary     * Lessie Dings, MD - Resident - Assisting         Indications for surgery: 68 year old male with diabetes, congestive heart failure, atrial fibrillation with pacemaker, and history of hepatocellular carcinoma s/p liver transplant in 2018 who presents with dysphagia. Barium swallow showed mild accumulation of boluses at tissue protrusion (~C6) c/w the appearance of a small Zenker's diverticulum.    The risks, benefits, and alternatives of endoscopic Zenker diverticulotomy were explained in detail, which include but are not limited to: risk of general anesthesia (stroke, heart attack, death), dysphagia, sore throat, esophageal perforation, failure to treat, need for repeated or additional procedures, bleeding, dental trauma, neck/shoulder pain, transient numbness or weakness of the tongue, transient disturbances in taste, pain and transient disturbances with speech and swallowing.    Significant Operative Findings:   1. Prominent cricopharyngeal bar with small Zenker's diverticulum. Diverticulectomy performed with CO2 laser   2. 10 units of Botox injected posteriorly into the cricopharyngeal muscle on the left and right                Anesthesia: General    Estimated Blood Loss: 2 mL    Complications: None    Specimens: None collected    Implants: None    Procedure:    The patient was appropriately identified and transferred back to the operating room by Anesthesia staff and placed in supine position on the  operating table.  Following, first pre-procedural timeout general anesthesia was induced without complication. Patient underwent general anesthesia by endotracheal intubation.  Eyes were taped and padded and care was turned over to the operating surgeon.  Head of bed was turned 90?? counterclockwise.  ??  After a second pre-procedural timeout to confirm patient identification, surgical procedure, site, plan and teams involved, the procedure began. A mouth guard was placed to protect the upper teeth. Patient was prepped and draped in normal fashion for this procedure.    The laser diverticuloscope was introduced in the patient's oral cavity and advanced into the post-cricoid space.  It was then slowly advanced until the cricopharyngeus came into view with the esophagus anteriorly and the diverticulum posteriorly.  The patient was then placed in [place] suspension and prepared for use of the laser with soaked towels and eyepads.  ??  The microscope was brought into the field as well as the operative CO2 laser.  Following a safety check, a pledget was placed into the esopahgus for protection. The laser was set to 3 W continuous and used to create a vertical incision between the esophagus and diverticula, connecting them and splaying the intervening cricopharyngeus. The cricopharyngeus was noted to be transected completely. Epinephrine-soaked pledgets were used for hemostasis.   ??   Next 100u Botulism toxin was reconstituted with sterile saline 4ml for 25 units per mL.  This was then loaded into a Xomed syringe. 5 units (0.2 mL) were then injected bilaterally into the posterior aspect of the cricopharyngeus muscle, for a total of 10 units.  ??  The Hopkin's rod was then used to take postoperative photos as above. The patient was taken out of suspension.  OG tube was placed into the piriform sinus and advanced.  Gastric contents were suctioned. Patient's hypopharynx, oropharynx and were examined on removal of all instruments.  This revealed no dental or gingival injury.  ??  Care was turned back to anesthesia. The patient emerged from anesthesia and was extubated without complication.  Patient was transferred to the postanesthesia care unit in stable condition.  ??  There were no immediate complications.  ??  Jeffrey Ward was present and participated throughout all critical components of the case.      I was present during all critical and key portions of the procedure and immediately available to furnish services throughout the entire duration of the procedure.?? See resident note for details. Christene Lye, MD

## 2018-04-04 NOTE — Unmapped (Signed)
I was called to PACU Rm 16 to reprogram pt device back to his home setting post op. The following changes were made:      pdf uploaded to the media.

## 2018-04-04 NOTE — Unmapped (Signed)
Recommendations for this procedure:  Pt is not PM dependant, only VP 5.9%. However, per surgical team's request, device is set to asynchronous pacing mode VOO @80  bpm prior to OR. Pt device needs to be reprogrammed back to his home setting post op. Please call EP RN [phone] for post op reprogramming. Thanks!    Perioperative Cardiac Implanted Device evaluation  Procedure: ESOPHAGOSCOPY, RIGID, TRANSORAL WITH DIVERTICULECTOMY OF HYPOPHARYNX OR   Surgeon: Vista Deck, MD  Location of Device: Right or Left: Left  Device Manufacturer/Type: Medtronic Micra VR TCP [id number]  Underlying Rhythm: SR with HR in 60-80s bpm  Dependent:  NO  Mode: VVIR  Lower Rate: 50    Surgical Procedure above Iliac Crest  Surgical Procedure Iliac Crest and Below  <6 inches from ICD: REPROGRAM DEVICE   ICD: NO CHANGE  <6 inches from PM: Reprogram if dependent    PM:  NO CHANGE  *If Monopolar Electrocautery and Magnet in [employer]*  >6 inches from ICD:  Secure Magnet over ICD  >6 inches from PM: Have Magnet available   Bipolar Cautery:  NO CHANGES NEEDED    Magnet Responses of Devices     Pacemaker  Rate    ICD/PM Tone  (Normal battery)    Medtronic VOO/DOO 85 bpm Deactivate Therapies Long Tone 5-10 seconds   Boston VOO/DOO 100 bpm Deactivate Therapies Tone with each heart beat   Abbott/St. Jude VOO/DOO 98.6/100 bpm Deactivate Therapies None   Biotronik VOO/DOO 90 Deactivate Therapies None   Sorin VOO/DOO 96 Deactivate Therapies-Some devices also Asynchronous pace None       Questions during the case, please call the EP device RN at [phone]  After hours, CICU Fellow

## 2018-04-04 NOTE — Unmapped (Signed)
shBrief Operative Note  (CSN: [id number])      Date of Surgery: 04/04/2018    Pre-op Diagnosis: zenkers diverticulum    Post-op Diagnosis: Zenker's diverticulum [K22.5]    Procedure(s):  ESOPHAGOSCOPY, RIGID, TRANSORAL WITH DIVERTICULECTOMY OF HYPOPHARYNX OR CERVICAL ESOPHAGUS, WITH CRICOPHARYNGEAL MYOTOMY, INCLUDES USE OF TELESCOPE OR OPERATING MICROSCOPE AND REPAIR, WHEN PERFORMED: 98119 (CPT??)  Note: Revisions to procedures should be made in chart - see Procedures activity.    Performing Service: ENT  Surgeon(s) and Role:     * Rupali Harvie Bridge, MD - Primary     * Lessie Dings, MD - Resident - Assisting    Assistant: None    Findings:   1. Prominent cricopharyngeal bar with small Zenker's diverticulum. Diverticulectomy performed with CO2 laser   2. 10 units of Botox injected posteriorly into the cricopharyngeal muscle on the left and right    Anesthesia: General    Estimated Blood Loss: 2 mL    Complications: None    Specimens: None collected    Implants: * No implants in log *    Surgeon Notes: I was present and scrubbed for the entire procedure    Lessie Dings   Date: 04/04/2018  Time: 11:23 AM

## 2018-04-04 NOTE — Unmapped (Addendum)
Called patient to remind him of getting a repeat in 2 weeks.   Patient wife confirmed that patient will get repeat lab in 2 weeks.

## 2018-04-05 DIAGNOSIS — K225 Diverticulum of esophagus, acquired: Principal | ICD-10-CM

## 2018-04-05 LAB — TROPONIN I: Troponin I.cardiac:MCnc:Pt:Ser/Plas:Qn:: <0.034

## 2018-04-05 MED ORDER — OXYCODONE 5 MG TABLET
ORAL_TABLET | ORAL | 0 refills | 0.00000 days | Status: CP | PRN
Start: 2018-04-05 — End: 2018-04-17
  Filled 2018-04-05: qty 10, 2d supply, fill #0

## 2018-04-05 MED ORDER — AMOXICILLIN 400 MG-POTASSIUM CLAVULANATE 57 MG/5 ML ORAL SUSPENSION
Freq: Two times a day (BID) | ORAL | 0 refills | 0.00 days | Status: CP
Start: 2018-04-05 — End: 2018-04-17
  Filled 2018-04-05: qty 150, 5d supply, fill #0

## 2018-04-05 MED FILL — OXYCODONE 5 MG TABLET: 2 days supply | Qty: 10 | Fill #0 | Status: AC

## 2018-04-05 MED FILL — NEORAL 100 MG CAPSULE: 30 days supply | Qty: 60 | Fill #3

## 2018-04-05 MED FILL — MYCOPHENOLATE MOFETIL 250 MG CAPSULE: 30 days supply | Qty: 120 | Fill #0 | Status: AC

## 2018-04-05 MED FILL — MG-PLUS-PROTEIN 133 MG TABLET: 25 days supply | Qty: 100 | Fill #0 | Status: AC

## 2018-04-05 MED FILL — AMOXICILLIN 400 MG-POTASSIUM CLAVULANATE 57 MG/5 ML ORAL SUSPENSION: 5 days supply | Qty: 150 | Fill #0 | Status: AC

## 2018-04-05 MED FILL — NEORAL 25 MG CAPSULE: 30 days supply | Qty: 180 | Fill #3 | Status: AC

## 2018-04-05 MED FILL — NEORAL 100 MG CAPSULE: 30 days supply | Qty: 60 | Fill #3 | Status: AC

## 2018-04-05 MED FILL — NEORAL 25 MG CAPSULE: 30 days supply | Qty: 180 | Fill #3

## 2018-04-05 NOTE — Unmapped (Signed)
Care Management  Initial Transition Planning Assessment  Type of Residence: Mailing Address:  [name]  [address]  [place] [address]  Contacts:    Patient Phone Number: [phone]        Medical Provider(s): [name], MD  Reason for Admission: Admitting Diagnosis:  Zenker's diverticulum [K22.5]  Past Medical History:   has a past medical history of Atrial fibrillation (CMS-HCC), Basal cell carcinoma, Cancer (CMS-HCC), Cirrhosis (CMS-HCC), Depression, Diabetes (CMS-HCC), Headache, Hepatitis C (07/17/2012), History of transfusion, Liver disease, Low back pain (07/17/2012), and Varices, esophageal (CMS-HCC).  Past Surgical History:   has a past surgical history that includes Back surgery; pr upper gi endoscopy,biopsy (N/A, 07/17/2012); Back surgery; Ankle surgery; pr upper gi endoscopy,ligat varix (N/A, 11/05/2013); pr upper gi endoscopy,diagnosis (N/A, 02/04/2014); pr colon ca scrn not hi rsk ind (N/A, 02/27/2015); pr endoscopic ultrasound exam (N/A, 02/27/2015); chg US guide, tissue ablation (N/A, 01/22/2016); pr lap,diagnostic abdomen (N/A, 01/22/2016); IR Embolization Organ Ischemia, Tumors, Infar (06/16/2016); pr tracheostomy, planned (N/A, 09/29/2016); pr inser heart temp pacer one chmbr (N/A, 10/02/2016); pr transplant liver,allotransplant (N/A, 09/15/2016); pr transplant,prep donor liver, whole (N/A, 09/15/2016); pr transcath insert or replace leadless pm ventr (N/A, 10/11/2016); pr place percut gastrostomy tube (N/A, 11/17/2016); pr ercp stent placement biliary/pancreatic duct (N/A, 11/29/2016); pr ercp,w/removal stone,bil/pancr ducts (N/A, 11/29/2016); pr ercp balloon dilate biliary/panc duct/ampulla ea (N/A, 02/10/2017); pr ercp remove foreign body/stent biliary/panc duct (N/A, 02/10/2017); pr ercp,w/removal stone,bil/pancr ducts (N/A, 02/10/2017); pr ercp stent placement biliary/pancreatic duct (N/A, 04/26/2017); pr ercp,w/removal stone,bil/pancr ducts (N/A, 04/26/2017); Cardiac surgery; pr ercp remove foreign body/stent biliary/panc duct (N/A, 06/29/2017); pr ercp,w/removal stone,bil/pancr ducts (N/A, 06/29/2017); and pr esophagoscp rig transoral hypopharynx crv esoph (N/A, 04/04/2018).   Previous admit date: 06/08/2017    Primary Insurance- Payor: MEDICARE / Plan: MEDICARE PART A AND PART B / Product Type: *No Product type* /   Secondary Insurance ??? Secondary Insurance  AARP  Prescription Coverage ??? Medicare  Preferred Pharmacy - [facility]  [facility]    Transportation home: Private vehicle  Level of function prior to admission: Independent                  General  Care Manager assessed the patient by : In person interview with patient, Medical record review, Discussion with Clinical Care team  Orientation Level: Oriented X4  Who provides care at home?: N/A  Reason for referral: Discharge Planning    Contact/Decision Maker  Extended Emergency Contact Information  Primary Emergency Contact: Noll,Judy   [place]  Home Phone: [phone]  Relation: Spouse    Legal Next of Kin / Guardian / POA / Advance Directives       Advance Directive (Medical Treatment)  Does patient have an advance directive covering medical treatment?: Patient has advance directive covering medical treatment, copy in chart.  Advance directive covering medical treatment not in Chart:: Copy requested from family  Information provided on advance directive:: No  Patient requests assistance:: No         Patient Information  Lives with: Spouse/significant other    Type of Residence: Private residence             Support Systems: Spouse    Responsibilities/Dependents at home?: No    Home Care services in place prior to admission?: No                  Equipment Currently Used at Home:  cane, straight, walker, rolling(Pt has a cane and walker but does not use them)       Currently receiving outpatient dialysis?: No       Financial Information       Need for financial assistance?: No       Social Determinants of Health  Social Determinants of Health were addressed in provider documentation.  Please refer to patient history.    Discharge Needs Assessment  Concerns to be Addressed: no discharge needs identified    Clinical Risk Factors: Multiple Diagnoses (Chronic)    Barriers to taking medications: No    Prior overnight hospital stay or ED visit in last 90 days: No              Anticipated Changes Related to Illness: none    Equipment Needed After Discharge: none    Discharge Facility/Level of Care Needs:      Readmission  Risk of Unplanned Readmission Score: UNPLANNED READMISSION SCORE: 17%  Predictive Model Details           16% (Meduim) Factors Contributing to Score   Calculated 04/05/2018 20:42 20% Number of active Rx orders is 26   [place] Risk of Unplanned Readmission Model 17% Charlson Comorbidity Index is 13     12% Diagnosis of cancer is present     11% Number of hospitalizations in last year is 2     11% ECG/EKG order is present in last 6 months     7% Imaging order is present in last 6 months     7% Latest hemoglobin is low (12.1 g/dL)     6% Age is 57     Readmitted Within the Last 30 Days? (No if blank)   Patient at risk for readmission?: No    Discharge Plan  Screen findings are: Care Manager reviewed the plan of the patient's care with the Multidisciplinary Team. No discharge planning needs identified at this time. Care Manager will continue to manage plan and monitor patient's progress with the team.    Expected Discharge Date: 04/05/18    Expected Transfer from Critical Care:      Patient and/or family were provided with choice of facilities / services that are available and appropriate to meet post hospital care needs?: N/A       Initial Assessment complete?: Yes

## 2018-04-05 NOTE — Unmapped (Signed)
Otolaryngology Discharge Summary    Admit date: 04/04/2018    Discharge date and time: 04/05/2018    Discharge to:  Home    Discharge Service: Otolaryngology (SRE)    Discharge Attending Physician: Vista Deck, MD    Admit  Diagnoses: Zenker's diverticulum [K22.5]    Discharge Diagnosis:   Patient Active Problem List   Diagnosis   ??? Hepatitis C   ??? Low back pain   ??? Hematuria   ??? Calculus of ureter   ??? Anemia   ??? Benign prostatic hyperplasia   ??? Chronic pain disorder   ??? Dysrhythmia   ??? History of gastroesophageal reflux (GERD)   ??? History of hepatitis C virus infection   ??? History of substance abuse (CMS-HCC)   ??? Hyperglycemia, unspecified   ??? Intermittent vertigo   ??? Microscopic hematuria   ??? Postlaminectomy syndrome, lumbar region   ??? Thrombocytopenia (CMS-HCC)   ??? Vitamin D deficiency   ??? Atrial fibrillation with RVR (CMS-HCC)   ??? Secondary esophageal varices (CMS-HCC)   ??? Right ventricular dilation   ??? End-stage liver disease (CMS-HCC)   ??? HCC (hepatocellular carcinoma) (CMS-HCC)   ??? Paroxysmal atrial fibrillation (CMS-HCC)   ??? Hepatic encephalopathy (CMS-HCC)   ??? S/P liver transplant (CMS-HCC)   ??? Pulmonary hypertension, moderate to severe (CMS-HCC)   ??? Acute right heart failure (CMS-HCC)   ??? Tracheostomy dependent (CMS-HCC)   ??? Mixed level of activity delirium due to multiple etiologies, resolved   ??? Insomnia   ??? Peripheral neuropathy   ??? Influenza A   ??? Hemothorax   ??? Gastrostomy tube in place (CMS-HCC)   ??? H/O tracheostomy   ??? S/P placement of cardiac pacemaker   ??? Uncontrolled type 2 diabetes mellitus without complication, with long-term current use of insulin (CMS-HCC)   ??? Vocal cord paralysis   ??? Zenker's diverticulum   ??? Medtronic Mirca VR TCP Leadless Pacemaker       Hospital Procedures:  04/04/2018   Endoscopic co2 laser cricopharyngeal myotomy with botox injection 10u    Reason for Admission/HPI:  Patient is a 68 y.o. male with DM, HTN, CHF, Afib with pacemaker, Hx HCC s/p liver transplant in 2018, dysphonia 2/2 VF paralysis and dysphagia found to have tiny Zenker's diverticulum and prominent cricopharyngeus    Hospital Course:    The patient was taken to the OR on 04/04/2018 for endoscopic Zenker's diverticulectomy with CO2 laser and injection of the cricopharyngeus muscle with Botox. The patient tolerated the procedure well and was extubated in the OR, then transferred to the PACU for close cardiorespiratory monitoring. The patient was stable postoperatively and transferred to the floor on POD 0. He remained strict NPO on POD 0 with maintenance IV fluids.  The patient remained clinically stable. The patient completed a cervical leak barium swallow on POD 1 which showed no leak. The patient continued to improve and diet was slowly advanced; at the time of discharge the patient was tolerating a CLD. The patient was able to void spontaneously, have pain controlled with P.O. pain medication, and return to preoperative ambulatory status. No crepitance, neck swelling, fever at time of dc. muskulloskeletal cp noted on day of dc. troponins neg ekg ned.        Hospital Consults:  IP CONSULT TO NUTRITION SERVICES    Nutrition Evaluation:  N/A    Other Clinically Significant Diagnoses:   has a past medical history of Atrial fibrillation (CMS-HCC), Basal cell carcinoma, Cancer (CMS-HCC), Cirrhosis (CMS-HCC), Depression,  Diabetes (CMS-HCC), Headache, Hepatitis C (07/17/2012), History of transfusion, Liver disease, Low back pain (07/17/2012), and Varices, esophageal (CMS-HCC).    Discharge Condition:    Subjective   No acute events overnight. The patient was seen and examined by the Otolaryngology-Head and Neck Surgery team on the day of discharge. Discharge plan was discussed, instructions were given and all questions answered.    Objective:    Temp:  [35.6 ??C-36.3 ??C] 35.9 ??C  Heart Rate:  [63-80] 69  SpO2 Pulse:  [70-80] 70  Resp:  [12-18] 16  BP: (121-152)/(61-79) 144/62  MAP (mmHg):  [88-100] 88  SpO2:  [96 %-100 %] 98 %  BMI (Calculated):  [26.07] 26.07    Exam:.  General: Awake, alert, oriented and in no acute distress.  HEENT: NCAT; HB 1/6; EOMI - sclera and conjunctiva clear; facial sensation intact and symmetric bilaterally in all division of CN 5; OC/OP clear - FOM soft and tongue protrudes midline.   Neck: Trachea midline, no masses. Soft, no crepitus  Pulm: Unlabored without stridor/stertor.    Condition at Discharge: Improved  Discharge Medications:      Your Medication List      STOP taking these medications    acetaminophen 325 MG tablet  Commonly known as:  TYLENOL     insulin ASPART 100 unit/mL (3 mL) injection pen  Commonly known as:  NovoLOG Flexpen U-100 Insulin     NovoLOG Flexpen U-100 Insulin 100 unit/mL (3 mL) injection pen  Generic drug:  insulin ASPART     ON CALL LANCING DEVICE Misc  Generic drug:  lancing device     pregabalin 75 MG capsule  Commonly known as:  LYRICA        START taking these medications    oxyCODONE 5 MG immediate release tablet  Commonly known as:  ROXICODONE  Take 1 tablet (5 mg total) by mouth every four (4) hours as needed for up to 10 doses.        CHANGE how you take these medications    amoxicillin-clavulanate 875-125 mg per tablet  Commonly known as:  AUGMENTIN  Take 1 tablet (875 mg total) by mouth 2 (two) times daily Take with food  What changed:  Another medication with the same name was added. Make sure you understand how and when to take each.     amoxicillin-clavulanate 400-57 mg/5 mL suspension  Commonly known as:  AUGMENTIN  Take 10.9 mL (875 mg total) by mouth Two (2) times a day for 5 days.  What changed:  You were already taking a medication with the same name, and this prescription was added. Make sure you understand how and when to take each.     blood-glucose meter kit  Commonly known as:  glucose monitoring kit  Use as instructed  What changed:  Another medication with the same name was removed. Continue taking this medication, and follow the directions you see here.        CONTINUE taking these medications    alendronate 70 MG tablet  Commonly known as:  FOSAMAX  Take 1 tablet (70 mg total) by mouth every seven (7) days.     azithromycin 250 MG tablet  Commonly known as:  ZITHROMAX  Take 2 tablets by mouth on day 1, then 2 tablets on day 2 -5.     BASAGLAR KWIKPEN U-100 INSULIN 100 unit/mL (3 mL) injection pen  Generic drug:  insulin glargine  INJECT 12 UNITS SUBCUTANEOUSLY (UNDER SKIN) NIGHTLY  blood sugar diagnostic Strp  Use three (3) times a day before meals.     calcium carbonate 500 mg calcium (1,250 mg) chewable tablet  Commonly known as:  OS-CAL  Chew 2 tablets Two (2) times a day.     DOK 100 MG capsule  Generic drug:  docusate sodium  TAKE 1 CAPSULE BY MOUTH TWICE DAILY     DULCOLAX (BISACODYL) 5 mg EC tablet  Generic drug:  bisacodyL  Take 5 mg by mouth.     ferrous sulfate 325 (65 FE) MG tablet  Take 325 mg by mouth.     GAVILAX 17 gram/dose powder  Generic drug:  polyethylene glycol  MIX 17 GRAMS (USE MEASURE LINE IN CAP) IN 4-8 OUNCES OF WATER, JUICE, SODA, COFFEE, OR TEA AND DRINK ONCE DAILY     magnesium oxide-Mg AA chelate 133 mg Tab  Commonly known as:  magnesium (amino acid chelate)  Take 2 tablets by mouth Two (2) times a day.     melatonin 3 mg Tab  Take 1 tablet (3 mg total) by mouth nightly.     mirtazapine 15 MG tablet  Commonly known as:  REMERON  TAKE 1 TABLET BY MOUTH NIGHTLY     mycophenolate 250 mg capsule  Commonly known as:  CELLCEPT  TAKE 2 CAPSULES (500MG ) BY MOUTH TWICE DAILY     NEORAL 100 MG capsule  Generic drug:  cycloSPORINE modified  TAKE 1 CAPSULE BY MOUTH TWICE DAILY WITH 3 OF THE 25MG  CAPSULES     NEORAL 25 MG capsule  Generic drug:  cycloSPORINE modified  TAKE 3 CAPSULES BY MOUTH TWICE DAILY WITH 100MG  CAPSULE     nitrofurantoin (macrocrystal-monohydrate) 100 MG capsule  Commonly known as:  MACROBID  Take 1 capsule (100 mg total) by mouth twice daily until gone.     pen needle, diabetic 32 gauge x 5/32 Ndle  USE WITH INSULIN PEN 4 TIMES DAILY AS DIRECTED     sildenafil (pulm.hypertension) 20 mg tablet  Commonly known as:  REVATIO  TAKE 2 TABLETS (40 MG TOTAL) BY G-TUBE THREE TIMES DAILY            Pending Test Results:   None    Pathology:  None    Discharge Instructions:     Activity:     Diet:    Other Instructions:      Labs and Other Follow-ups after Discharge:  Follow Up instructions and Outpatient Referrals     Discharge instructions      No straining or heavy lifting x 1 week.  You should remain on your home antireflux therapy.  If you are not on one, you may be provided one for the postop period. You will likely be provided with pain medication.  Do not drive or operate heavy machinery while taking narcotics. You may resume a normal diet, although soft foods may be most comfortable for the initial few days postoperatively.  It is recommended to refrain from highly acidic foods such as tomato based products, alcohol, and significant caffeine.    You may experience tongue numbness, taste disturbance, and/or sore throat.  This is expected to be temporary in nature.    During regular business hours, call the ENT clinic at [phone] for fever >101.37F by mouth, uncontrolled nausea or vomiting, pain uncontrolled with prescribed medication, or any difficulty breathing.  Please call for any new or concerning symptoms including changes in vision, mobility, and mental status. Ask for the Triage nurse.  For urgent questions or concerns AFTER HOURS, call the hospital operator at [phone] and ask for ENT resident on call.    For clinic appointment scheduling inquires, please call  [phone].  For specific questions for Dr. Sherryll Burger, non-urgent/emergent issues, please call her medical Joanette Gula @ [phone]    ACTIVITY:   Minimize your activities with only light activity for the first week following surgery.   Listen to your body!  If you feel tired over the first few days, you should rest.  No straining or heavy lifting > 2 bags of groceries.     No CPAP use until your follow up appointment    MEDICATIONS:    Resume all of your home medications unless instructed otherwise.    PAIN:   Mild to moderate pain: Over the counter  Pain mediations as per the instructions on the bottle  Severe pain: Take your pain medicine prescription as directed.    You have been prescribed a narcotic pain medication. Do not drive or make critical/important decisions while taking narcotics pain medications. Narcotic medications may cause constipation. Ensure you have adequate (>25 grams/day) of fiber in your diet and drink at least 64 oz. of water daily. You may also wish to take an over the counter stool softener once or twice daily.    DRIVING:  Do not drive within 24 hours of receiving anesthesia    Do not drive while taking prescription pain medication    DIET:  You should follow a CLEAR LIQUID for 24 hours after discharge, and then transition to a SOFT diet until follow-up.    FOLLOW-UP:    You will have important follow-up appointments starting about one week following surgery    If you do not have an appointment, please call the numbers below to schedule a visit.    [facility] [phone]  [facility] [phone]    Call our office or go to the Emergency Room immediately if you have any of the following:  Any vision problems/changes  Chest pain  Fever over 101.4  Neck stiffness or swelling  Difficulty breathing, swallowing or other concerning signs or symptoms.     Regular hours: [phone] to speak with a nurse.  After hours: [phone] and ask for the Ear, Nose and Throat (ENT) doctor on call             Future Appointments:  Appointments which have been scheduled for you    Apr 09, 2018  1:00 PM EST  (Arrive by 12:45 PM)  [facility] NEW SPINE PMR/NP with Windell Norfolk, MD  [facility] SPINE CTR NEUROSRG PMR [facility] [address] [place] REGION) [address] [place] [address]  [phone]      Apr 10, 2018  9:45 AM EST  (Arrive by 9:30 AM)  POST-OP 15 with Alexsia Klindt Harvie Bridge, MD  [place] [facility] [address] [place] [facility] REGION) [address]  [place] [address]  [phone]      Jun 15, 2018 11:30 AM EDT  (Arrive by 11:15 AM)  RETURN  PULM HYPERTENSION with Jettie Booze, MD  [facility] PULMONARY SPECIALTY CL [address] [place] [facility] REGION) [address]  [address]  [place] [address]  [phone]      Jul 24, 2018  3:30 PM EDT  (Arrive by 3:15 PM)  Danelle Earthly RETURN with Elisabeth Most, MD  [facility]  NEUROLOGY CLINIC [address] [place] [facility] REGION) [address]  [place] [address]  [phone]                 I saw and evaluated the patient, participating in the key portions of the service on the day of discharge.?? I reviewed the resident???s note and agree with the discharge plans and disposition. I personally spent 30 minutes in discharge planning services. Christene Lye, MD

## 2018-04-05 NOTE — Unmapped (Addendum)
The patient was taken to the OR on 04/04/2018 for endoscopic Zenker's diverticulectomy with CO2 laser and injection of the cricopharyngeus muscle with Botox. The patient tolerated the procedure well and was extubated in the OR, then transferred to the PACU for close cardiorespiratory monitoring. The patient was stable postoperatively and transferred to the floor on POD 0. He remained strict NPO on POD 0 with maintenance IV fluids.  The patient remained clinically stable. The patient completed a cervical leak barium swallow on POD 1 which showed no leak. The patient continued to improve and diet was slowly advanced; at the time of discharge the patient was tolerating a CLD. The patient was able to void spontaneously, have pain controlled with P.O. pain medication, and return to preoperative ambulatory status. Surgical wounds are healing well.

## 2018-04-05 NOTE — Unmapped (Signed)
No signs or symptoms of DVT. SCDs worn in bed. Heparin prophylaxis therapy not ordered.  Pain poorly controlled with PRN IV morphine. One time extra dose ordered this shift to ease pain enough for sleep. Patient states most of his pain is chronic, not related to surgical site.  No falls or injuries this shift. Safety precautions maintained. Patient has not ambulated post-operatively.  Plan for swallow study tomorrow. Will continue to monitor and provide nursing interventions.    Problem: Adult Inpatient Plan of Care  Goal: Plan of Care Review  Outcome: Progressing  Goal: Patient-Specific Goal (Individualization)  Outcome: Progressing  Goal: Absence of Hospital-Acquired Illness or Injury  Outcome: Progressing  Goal: Optimal Comfort and Wellbeing  Outcome: Progressing  Goal: Readiness for Transition of Care  Outcome: Progressing  Goal: Rounds/Family Conference  Outcome: Progressing     Problem: Fall Injury Risk  Goal: Absence of Fall and Fall-Related Injury  Outcome: Ongoing - Unchanged     Problem: Pain Acute  Goal: Optimal Pain Control  Outcome: Ongoing - Unchanged     Problem: Pain Chronic (Persistent)  Goal: Acceptable Pain Control and Functional Ability  Outcome: Ongoing - Unchanged     Problem: Venous Thromboembolism  Goal: VTE (Venous Thromboembolism) Symptom Resolution  Outcome: Ongoing - Unchanged

## 2018-04-05 NOTE — Unmapped (Signed)
Otolaryngology Progress Note    Hospital Day: 1  Day of Surgery    Assessment/Plan:  Patient is a 68 y.o. male with DM, HTN, CHF, Afib with pacemaker, Hx HCC s/p liver transplant in 2018, dysphonia 2/2 VF paralysis and dysphagia found to have Zenker's diverticulum and prominent cricopharyngeus, now s/p endoscopic Zenker's diverticulectomy with CO2 laser and cricopharyngeus muscle injection with Botox on 1/22    Neuro  - Pain control with IV Tylenol q6hr PRN  - Morphine 2mg  PRN q3hr  HEENT  * Dysphagia s/p endoscopic Zenker's diverticulectomy  - Strict NPO, NO exceptions  - Will plan for cervical leak study in AM - gastrograffin only.   - IV Unasyn x 24 hours  CV  * Afib with pacemaker   - Reprogrammed in PACU with no issues   Pulm  * Pulmonary hypertension  - No issues, will continue to monitor  FEN/GI  - Strict NPO w/ MIVF @ 100 mL/hr  - Started IV Protonix  GU  - Voiding  Heme/ID  - IV Unasyn as above  * Thrombocytopenia  - Plts 90 today   Endo  - SSI, monitor closely as patient is NPO  Dispo:   Floor, possible d/c in AM    Principal Problem:    Zenker's diverticulum       LOS: 0 days     Subjective:  No acute events since OR. Pain with swallowing but controlled with morphine. Voiding. No shortness of breath or difficulty breathing    Objective:    Vital signs in last 24 hours:  Temp:  [35.6 ??C-36.9 ??C] 35.6 ??C  Heart Rate:  [67-80] 67  SpO2 Pulse:  [70-80] 70  Resp:  [10-18] 16  BP: (117-144)/(63-79) 133/76  MAP (mmHg):  [91-100] 99  SpO2:  [96 %-100 %] 100 %  BMI (Calculated):  [26.07] 26.07    Intake/Output last 3 shifts:  No intake/output data recorded.  Intake/Output this shift:  I/O this shift:  In: 300 [I.V.:300]  Out: 477 [Urine:475; Blood:2]    Physical Exam:  General: Awake, alert, oriented sitting upright in bed in NAD.  Head and Face: NCAT; HB 1/6;facial sensation intact and symmetric bilaterally in all division of CN 5  Eyes: EOMI - sclera and conjunctiva clear;   OC/OP: clear - FOM soft and tongue protrudes midline.  Neck: flat, no crepitance or hematoma  Pulm: Unlabored - no stridor/stertor; CTAB - no wheezes, rhonchi, rales.  Abd: Soft/NT/ND. Normal active bowel sounds.    Labs and Diagnostic Tests  Lab Results   Component Value Date    WBC 3.9 (L) 04/04/2018    HGB 12.1 (L) 04/04/2018    HCT 36.3 (L) 04/04/2018    PLT 90 (L) 04/04/2018       Lab Results   Component Value Date    NA 137 03/27/2018    K 4.9 03/27/2018    CL 105 03/27/2018    CO2 25.0 03/27/2018    BUN 40 (H) 03/27/2018    CREATININE 1.69 (H) 03/27/2018    GLU 157 03/27/2018    CALCIUM 10.6 (H) 03/27/2018    MG 1.9 03/27/2018    PHOS 3.3 03/27/2018       Lab Results   Component Value Date    BILITOT 0.5 03/27/2018    BILIDIR 0.10 03/27/2018    PROT 6.4 (L) 03/27/2018    ALBUMIN 3.9 03/27/2018    ALT 10 03/27/2018    AST 15 (L) 03/27/2018  ALKPHOS 102 03/27/2018    GGT 39 03/27/2018       Lab Results   Component Value Date    LABPROT 14.5 (H) 01/09/2014    INR 1.00 03/13/2018    APTT 33.6 03/13/2018

## 2018-04-06 NOTE — Unmapped (Signed)
Recent:   What is the date of your last related visit?  D/c'd [facility]/ENT service; see Epic for meds/d/c dx  Related acute medications Rx'd:  N/A  Home treatment tried:  N/A    Relevant:   Allergies: N/A  Medications: N/A  Health History: N/A   Weight: N/A               Reason for Disposition  ??? Cough with no complications    Answer Assessment - Initial Assessment Questions  1. ONSET: When did the cough begin?       Began when pt awakened this AM  2. SEVERITY: How bad is the cough today?       Mild   3. RESPIRATORY DISTRESS: Describe your breathing.       Respirations regular, no wheezing, SOB noted   4. FEVER: Do you have a fever? If so, ask: What is your temperature, how was it measured, and when did it start?      afebrile  5. HEMOPTYSIS: Are you coughing up any blood? If so ask: How much? (flecks, streaks, tablespoons, etc.)      none  6. TREATMENT: What have you done so far to treat the cough? (e.g., meds, fluids, humidifier)      No treatment so far - mild cough and dry in nature  7. CARDIAC HISTORY: Do you have any history of heart disease? (e.g., heart attack, congestive heart failure)       aFib  8. LUNG HISTORY: Do you have any history of lung disease?  (e.g., pulmonary embolus, asthma, emphysema)      none  9. PE RISK FACTORS: Do you have a history of blood clots? (or: recent major surgery, recent prolonged travel, bedridden)      no  10. OTHER SYMPTOMS: Do you have any other symptoms? (e.g., runny nose, wheezing, chest pain)        Sore throat - spouse thinks it's from being intubated but able to swallow/mild pain  11. PREGNANCY: Is there any chance you are pregnant? When was your last menstrual period?        n/a  12. TRAVEL: Have you traveled out of the country in the last month? (e.g., travel history, exposures)        n/a    Protocols used: COUGH-ADULT-[place]

## 2018-04-09 ENCOUNTER — Ambulatory Visit
Admit: 2018-04-09 | Discharge: 2018-04-10 | Payer: MEDICARE | Attending: Physical Medicine & Rehabilitation | Primary: Physical Medicine & Rehabilitation

## 2018-04-09 DIAGNOSIS — M479 Spondylosis, unspecified: Principal | ICD-10-CM

## 2018-04-09 DIAGNOSIS — Z981 Arthrodesis status: Secondary | ICD-10-CM

## 2018-04-09 NOTE — Unmapped (Addendum)
PM&R - [facility]     Patient Name:Jeffrey Ward  MRN: [id number]  DOB: April 09, 1950  Age: 68 y.o.     ASSESSMENT & PLAN:     04/09/2018     68 year old male with history of T10-L2 and L5-S1 spinal fusion who presents with chronic low back pain. Patient's symptoms appear related to his right SI joint as he may have developed some associated SI joint dysfunction secondary to his previous spinal fusions. It would be difficult to access his lumbar spine for potential MBB injections given his previous multi-level posterior instrumented fusions. Patient and wife would like to move forward with a potential SI joint injection for symptomatic relief.    DIAGNOSIS: SI joint dysfunction  TREATMENT PLAN:   - Will schedule for right SI joint injection  NEXT STEPS/FOLLOW UP:  - Follow up at procedure visit for SI joint injection.    Comprehensive patient education performed today explaining that the care plan will integrate multimodal activity-based rehabilitation techniques to enhance recovery, reduce pain, and facilitate functionality. Risks, benefits, and instructions for all medications prescribed / treatments offered  reviewed extensively with patient, who expresses understanding.  Patient strongly advised regarding red flag signs such as new or progressive motor weakness, sensory deficits, saddle anesthesia, bowel/bladder dysfunction, gait/coordination disturbance, weight loss, and night pain that should prompt evaluation at nearest ER.    A consultation report has been transmitted to the consult requesting physician.    SUBJECTIVE:     Chief Complaint:    Back Pain; Hip Pain; and Knee Pain    History of Present Illness:   Jeffrey Ward is a 68 y.o. year old male being evaluated in consultation at the request of Kristopher Prudy Feeler, MD for Back Pain; Hip Pain; and Knee Pain    He was seen by Dr. Riccardo Dubin most recently on 02/12/18 where he received trigger point injections to the right lower lumbar paraspinal muscles. MRI Lumbar spine on 02/21/18 illustrated an old stable compression fracture at T12 with stable grade 2 anterolisthesis of L5 on S1 with associated mild left foraminal and central canal/subarticular zone stenosis.    Today, he reports he continues to have low back pain (R>L) with associated leg symptoms that radiate to the right ankle. He previously had surgery on his right ankle and has since had weakness in his right EHL. His low back pain has been exacerbated by a recent right hip fracture in September 2019 after a fall. He denies any falls since then. The pain radiates to leg usually when he walks upstairs. He is currently taking Lyrica which does tend to help his symptoms. He is also taking Tylenol BID. Ambulates with a cane.    Symptom Location: low back, right leg  Symptom Character: burning, shooting and stiffness  Symptom Onset/Mechanism: chronic (>/= 3 months) - February 1999  Temporal Pattern: constant  What number best describes your pain on average in the past week?:  7  Aggravating Factors: sitting, walking, lying down  Alleviating Factors: none   Unintended Weight Loss: no  Fever/Infection:no  Neuromotor Function: motor weakness - (no),  gait/coordination disturbance - (no), loss of bowel or bladder control - (no), saddle anesthesia - (no)     Current Outpatient Medications   Medication Sig Dispense Refill   ??? alendronate (FOSAMAX) 70 MG tablet Take 1 tablet (70 mg total) by mouth every seven (7) days. 4 tablet 11   ??? amoxicillin-clavulanate (AUGMENTIN) 400-57 mg/5 mL suspension Take 10.9 mL (875  mg total) by mouth Two (2) times a day for 5 days. Discard remainder 150 mL 0   ??? blood sugar diagnostic Strp Use three (3) times a day before meals. 100 each 7   ??? blood-glucose meter (GLUCOSE MONITORING KIT) kit Use as instructed 1 each 0   ??? calcium carbonate (OS-CAL) 500 mg calcium (1,250 mg) chewable tablet Chew 2 tablets Two (2) times a day.      ??? ferrous sulfate 325 (65 FE) MG tablet Take 325 mg by mouth.     ??? insulin glargine (BASAGLAR, LANTUS) 100 unit/mL (3 mL) injection pen INJECT 12 UNITS SUBCUTANEOUSLY (UNDER SKIN) NIGHTLY 15 mL 10   ??? magnesium oxide-Mg AA chelate (MAGNESIUM, AMINO ACID CHELATE,) 133 mg Tab Take 2 tablets by mouth Two (2) times a day. 100 tablet PRN   ??? melatonin 3 mg Tab Take 1 tablet (3 mg total) by mouth nightly. 30 tablet 0   ??? mirtazapine (REMERON) 15 MG tablet TAKE 1 TABLET BY MOUTH NIGHTLY 90 tablet 3   ??? mycophenolate (CELLCEPT) 250 mg capsule TAKE 2 CAPSULES (500MG ) BY MOUTH TWICE DAILY 120 capsule 7   ??? NEORAL 100 mg capsule TAKE 1 CAPSULE BY MOUTH TWICE DAILY WITH 3 OF THE 25MG  CAPSULES 60 each 11   ??? NEORAL 25 mg capsule TAKE 3 CAPSULES BY MOUTH TWICE DAILY WITH 100MG  CAPSULE 180 each 11   ??? nitrofurantoin, macrocrystal-monohydrate, (MACROBID) 100 MG capsule Take 1 capsule (100 mg total) by mouth twice daily until gone. 13 capsule 0   ??? pen needle, diabetic 32 gauge x 5/32 Ndle USE WITH INSULIN PEN 4 TIMES DAILY AS DIRECTED 100 each 1   ??? sildenafil, pulm.hypertension, (REVATIO) 20 mg tablet TAKE 2 TABLETS (40 MG TOTAL) BY G-TUBE THREE TIMES DAILY 180 tablet 6   ??? amoxicillin-clavulanate (AUGMENTIN) 875-125 mg per tablet Take 1 tablet (875 mg total) by mouth 2 (two) times daily Take with food (Patient not taking: Reported on 04/09/2018) 14 tablet 0   ??? azithromycin (ZITHROMAX) 250 MG tablet Take 2 tablets by mouth on day 1, then 2 tablets on day 2 -5. (Patient not taking: Reported on 04/09/2018) 6 tablet 1   ??? bisacodyl (DULCOLAX, BISACODYL,) 5 mg EC tablet Take 5 mg by mouth.     ??? docusate sodium (COLACE) 100 MG capsule TAKE 1 CAPSULE BY MOUTH TWICE DAILY (Patient not taking: Reported on 04/09/2018) 60 capsule 0   ??? oxyCODONE (ROXICODONE) 5 MG immediate release tablet Take 1 tablet (5 mg total) by mouth every four (4) hours as needed for up to 10 doses. (Patient not taking: Reported on 04/09/2018) 10 tablet 0   ??? polyethylene glycol (GLYCOLAX) 17 gram/dose powder MIX 17 GRAMS (USE MEASURE LINE IN CAP) IN 4-8 OUNCES OF WATER, JUICE, SODA, COFFEE, OR TEA AND DRINK ONCE DAILY (Patient not taking: Reported on 04/09/2018) 527 g 0     No current facility-administered medications for this visit.        Allergies:   Patient has no known allergies.    Past Medical / Surgical History:     Past Medical History:   Diagnosis Date   ??? Atrial fibrillation (CMS-HCC)    ??? Basal cell carcinoma    ??? Cancer (CMS-HCC)    ??? Cirrhosis (CMS-HCC)    ??? Depression    ??? Diabetes (CMS-HCC)    ??? Headache    ??? Hepatitis C 07/17/2012   ??? History of transfusion    ??? Liver disease    ???  Low back pain 07/17/2012   ??? Varices, esophageal (CMS-HCC)      Patient Active Problem List    Diagnosis Date Noted   ??? 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 Vocal cord paralysis 01/11/2018   ??? Hemothorax 05/25/2017   ??? S/P placement of cardiac pacemaker 04/17/2017   ??? Influenza A 02/28/2017   ??? Gastrostomy tube in place (CMS-HCC) 01/16/2017   ??? H/O tracheostomy 01/16/2017   ??? Peripheral neuropathy 12/08/2016   ??? Insomnia 11/29/2016   ??? Mixed level of activity delirium due to multiple etiologies, resolved 11/01/2016   ??? Medtronic Mirca VR TCP Leadless Pacemaker 10/11/2016   ??? Pulmonary hypertension, moderate to severe (CMS-HCC) 10/03/2016   ??? Acute right heart failure (CMS-HCC) 10/03/2016   ??? 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 S/P liver transplant (CMS-HCC) 09/15/2016   ??? Hepatic encephalopathy (CMS-HCC) 07/27/2016   ??? Uncontrolled type 2 diabetes mellitus without complication, with long-term current use of insulin (CMS-HCC) 07/12/2016   ??? End-stage liver disease (CMS-HCC) 02/09/2016   ??? HCC (hepatocellular carcinoma) (CMS-HCC) 02/09/2016   ??? Right ventricular dilation 01/29/2016   ??? Paroxysmal atrial fibrillation (CMS-HCC) 01/28/2016   ??? Atrial fibrillation with RVR (CMS-HCC) 01/27/2016   ??? Secondary esophageal varices (CMS-HCC) 01/27/2016   ??? Anemia 12/22/2015   ??? Benign prostatic hyperplasia 12/22/2015   ??? History of gastroesophageal reflux (GERD) 12/22/2015   ??? History of substance abuse (CMS-HCC) 12/22/2015   ??? Microscopic hematuria 12/22/2015   ??? Thrombocytopenia (CMS-HCC) 12/22/2015   ??? Vitamin D deficiency 12/22/2015   ??? Intermittent vertigo 08/11/2015   ??? History of hepatitis C virus infection 07/22/2015   ??? Hyperglycemia, unspecified 07/22/2015   ??? Dysrhythmia 07/15/2014   ??? Calculus of ureter 04/20/2013   ??? Hematuria 04/18/2013   ??? Chronic pain disorder 01/09/2013   ??? Postlaminectomy syndrome, lumbar region 01/09/2013   ??? Hepatitis C 07/17/2012   ??? Low back pain 07/17/2012     Past Surgical History:   Procedure Laterality Date   ??? ANKLE SURGERY     ??? BACK SURGERY     ??? BACK SURGERY     ??? CARDIAC SURGERY      pacemaker   ??? CHG US GUIDE, TISSUE ABLATION N/A 01/22/2016    Procedure: ULTRASOUND GUIDANCE FOR, AND MONITORING OF, PARENCHYMAL TISSUE ABLATION;  Surgeon: Particia Nearing, MD;  Location: MAIN OR [facility];  Service: Transplant   ??? IR EMBOLIZATION ORGAN ISCHEMIA, TUMORS, INFAR  06/16/2016    IR EMBOLIZATION ORGAN ISCHEMIA, TUMORS, INFAR 06/16/2016 Ammie Dalton, MD IMG VIR H[facility]   ??? PR COLON CA SCRN NOT HI RSK IND N/A 02/27/2015    Procedure: COLOREC CNCR SCR;COLNSCPY NO;  Surgeon: Vonda Antigua, MD;  Location: GI PROCEDURES MEMORIAL [facility];  Service: Gastroenterology   ??? PR ENDOSCOPIC ULTRASOUND EXAM N/A 02/27/2015    Procedure: UGI ENDO; W/ENDO ULTRASOUND EXAM INCLUDES ESOPHAGUS, STOMACH, &/OR DUODENUM/JEJUNUM;  Surgeon: Vonda Antigua, MD;  Location: GI PROCEDURES MEMORIAL [facility];  Service: Gastroenterology   ??? PR ERCP BALLOON DILATE BILIARY/PANC DUCT/AMPULLA EA N/A 02/10/2017    Procedure: ERCP;WITH TRANS-ENDOSCOPIC BALLOON DILATION OF BILIARY/PANCREATIC DUCT(S) OR OF AMPULLA, INCLUDING SPHINCTERECTOMY, WHEN PERFOREMD,EACH DUCT (16109);  Surgeon: Mayford Knife, MD;  Location: GI PROCEDURES MEMORIAL [facility];  Service: Gastroenterology   ??? PR ERCP REMOVE FOREIGN BODY/STENT BILIARY/PANC DUCT N/A 02/10/2017 Procedure: ENDOSCOPIC RETROGRADE CHOLANGIOPANCREATOGRAPHY (ERCP); W/ REMOVAL OF FOREIGN BODY/STENT FROM BILIARY/PANCREATIC DUCT(S);  Surgeon: Mayford Knife, MD;  Location: GI PROCEDURES MEMORIAL [facility];  Service:  Gastroenterology   ??? PR ERCP REMOVE FOREIGN BODY/STENT BILIARY/PANC DUCT N/A 06/29/2017    Procedure: ENDOSCOPIC RETROGRADE CHOLANGIOPANCREATOGRAPHY (ERCP); W/ REMOVAL OF FOREIGN BODY/STENT FROM BILIARY/PANCREATIC DUCT(S);  Surgeon: Vonda Antigua, MD;  Location: GI PROCEDURES MEMORIAL [facility];  Service: Gastroenterology   ??? PR ERCP STENT PLACEMENT BILIARY/PANCREATIC DUCT N/A 11/29/2016    Procedure: ENDOSCOPIC RETROGRADE CHOLANGIOPANCREATOGRAPHY (ERCP); WITH PLACEMENT OF ENDOSCOPIC STENT INTO BILIARY OR PANCREATIC DUCT;  Surgeon: Chriss Driver, MD;  Location: GI PROCEDURES MEMORIAL [facility];  Service: Gastroenterology   ??? PR ERCP STENT PLACEMENT BILIARY/PANCREATIC DUCT N/A 04/26/2017    Procedure: ENDOSCOPIC RETROGRADE CHOLANGIOPANCREATOGRAPHY (ERCP); WITH PLACEMENT OF ENDOSCOPIC STENT INTO BILIARY OR PANCREATIC DUCT;  Surgeon: Mayford Knife, MD;  Location: GI PROCEDURES MEMORIAL [facility];  Service: Gastroenterology   ??? PR ERCP,W/REMOVAL STONE,BIL/PANCR DUCTS N/A 11/29/2016    Procedure: ERCP; W/ENDOSCOPIC RETROGRADE REMOVAL OF CALCULUS/CALCULI FROM BILIARY &/OR PANCREATIC DUCTS;  Surgeon: Chriss Driver, MD;  Location: GI PROCEDURES MEMORIAL [facility];  Service: Gastroenterology   ??? PR ERCP,W/REMOVAL STONE,BIL/PANCR DUCTS N/A 02/10/2017    Procedure: ERCP; W/ENDOSCOPIC RETROGRADE REMOVAL OF CALCULUS/CALCULI FROM BILIARY &/OR PANCREATIC DUCTS;  Surgeon: Mayford Knife, MD;  Location: GI PROCEDURES MEMORIAL [facility];  Service: Gastroenterology   ??? PR ERCP,W/REMOVAL STONE,BIL/PANCR DUCTS N/A 04/26/2017    Procedure: ERCP; W/ENDOSCOPIC RETROGRADE REMOVAL OF CALCULUS/CALCULI FROM BILIARY &/OR PANCREATIC DUCTS;  Surgeon: Mayford Knife, MD;  Location: GI PROCEDURES MEMORIAL [facility];  Service: Gastroenterology   ??? PR ERCP,W/REMOVAL STONE,BIL/PANCR DUCTS N/A 06/29/2017    Procedure: ERCP; W/ENDOSCOPIC RETROGRADE REMOVAL OF CALCULUS/CALCULI FROM BILIARY &/OR PANCREATIC DUCTS;  Surgeon: Vonda Antigua, MD;  Location: GI PROCEDURES MEMORIAL [facility];  Service: Gastroenterology   ??? PR ESOPHAGOSCP RIG TRANSORAL HYPOPHARYNX CRV ESOPH N/A 04/04/2018    Procedure: ESOPHAGOSCOPY, RIGID, TRANSORAL WITH DIVERTICULECTOMY OF HYPOPHARYNX OR CERVICAL ESOPHAGUS, WITH CRICOPHARYNGEAL MYOTOMY, INCLUDES USE OF TELESCOPE OR OPERATING MICROSCOPE AND REPAIR, WHEN PERFORMED;  Surgeon: Vista Deck, MD;  Location: MAIN OR [facility];  Service: ENT   ??? PR INSER HEART TEMP PACER ONE CHMBR N/A 10/02/2016    Procedure: Tempoarary Pacemaker Insertion;  Surgeon: Meredith Leeds, MD;  Location: [facility];  Service: Cardiology   ??? PR LAP,DIAGNOSTIC ABDOMEN N/A 01/22/2016    Procedure: Laparoscopy, Abdomen, Peritoneum, & Omentum, Diagnostic, W/Wo Collection Specimen(S) By Brushing Or Washing;  Surgeon: Particia Nearing, MD;  Location: MAIN OR [facility];  Service: Transplant   ??? PR PLACE PERCUT GASTROSTOMY TUBE N/A 11/17/2016    Procedure: UGI ENDO; W/DIRECTED PLCMT PERQ GASTROSTOMY TUBE;  Surgeon: Cletis Athens, MD;  Location: GI PROCEDURES MEMORIAL [facility];  Service: Gastroenterology   ??? PR TRACHEOSTOMY, PLANNED N/A 09/29/2016    Procedure: TRACHEOSTOMY PLANNED (SEPART PROC);  Surgeon: Katherina Mires, MD;  Location: MAIN OR [facility];  Service: Trauma   ??? PR TRANSCATH INSERT OR REPLACE LEADLESS PM VENTR N/A 10/11/2016    Procedure: Pacemaker Implant/Replace Leadless;  Surgeon: Meredith Leeds, MD;  Location: [facility];  Service: Cardiology   ??? PR TRANSPLANT LIVER,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    Procedure: Liver Allotransplantation; Orthotopic, Partial Or Whole, From Cadaver Or Living Donor, Any Age;  Surgeon: Doyce Loose, MD;  Location: MAIN OR [facility];  Service: Transplant   ??? PR TRANSPLANT,PREP DONOR LIVER, WHOLE N/A 09/15/2016    Procedure: Rogelia Boga Std Prep Cad Donor Whole Liver Gft Prior Tnsplnt,Inc Chole,Diss/Rem Surr Tissu Wo Triseg/Lobe Splt;  Surgeon: Doyce Loose, MD;  Location: MAIN OR [facility];  Service: Transplant   ??? PR UPPER  GI ENDOSCOPY,BIOPSY N/A 07/17/2012    Procedure: UGI ENDOSCOPY; WITH BIOPSY, SINGLE OR MULTIPLE;  Surgeon: Alba Destine, MD;  Location: GI PROCEDURES [facility];  Service: Gastroenterology   ??? PR UPPER GI ENDOSCOPY,DIAGNOSIS N/A 02/04/2014    Procedure: UGI ENDO, INCLUDE ESOPHAGUS, STOMACH, & DUODENUM &/OR JEJUNUM; DX W/WO COLLECTION SPECIMN, BY BRUSH OR WASH;  Surgeon: Wilburt Finlay, MD;  Location: GI PROCEDURES MEMORIAL [facility];  Service: Gastroenterology   ??? PR UPPER GI ENDOSCOPY,LIGAT VARIX N/A 11/05/2013    Procedure: UGI ENDO; W/BAND LIG ESOPH &/OR GASTRIC VARICES;  Surgeon: Wilburt Finlay, MD;  Location: GI PROCEDURES MEMORIAL [facility];  Service: Gastroenterology       Social History     Socioeconomic History   ??? Marital status: Married     Spouse name: Not on file   ??? Number of children: Not on file   ??? Years of education: Not on file   ??? Highest education level: Not on file   Occupational History   ??? Not on file   Social Needs   ??? Financial resource strain: Not on file   ??? Food insecurity     Worry: Not on file     Inability: Not on file   ??? Transportation needs     Medical: Not on file     Non-medical: Not on file   Tobacco Use   ??? Smoking status: Never Smoker   ??? Smokeless tobacco: Never Used   ??? Tobacco comment: Smoked in high school for about 5 years.    Substance and Sexual Activity   ??? Alcohol use: No   ??? Drug use: No   ??? Sexual activity: Yes     Partners: Female     Birth control/protection: None   Lifestyle   ??? Physical activity     Days per week: Not on file     Minutes per session: Not on file   ??? Stress: Not on file   Relationships   ??? Social Wellsite geologist on phone: Not on file     Gets together: Not on file     Attends religious service: Not on file     Active member of club or organization: Not on file     Attends meetings of clubs or organizations: Not on file     Relationship status: Not on file   Other Topics Concern   ??? Do you use sunscreen? Not Asked   ??? Tanning bed use? Not Asked   ??? Are you easily burned? Not Asked   ??? Excessive sun exposure? Not Asked   ??? Blistering sunburns? Not Asked   ??? Exercise No   ??? Living Situation No   Social History Narrative   ??? Not on file       Family History   Problem Relation Age of Onset   ??? Hypertension Mother    ??? Cancer Mother    ??? Heart disease Mother    ??? Heart disease Father    ??? Cirrhosis Neg Hx    ??? Liver cancer Neg Hx    ??? Anemia Neg Hx    ??? Diabetes Neg Hx    ??? Kidney disease Neg Hx    ??? Obesity Neg Hx    ??? Thyroid disease Neg Hx    ??? Osteoporosis Neg Hx    ??? Coronary artery disease Neg Hx    ??? Anesthesia problems Neg Hx    ??? Basal cell carcinoma  Neg Hx    ??? Squamous cell carcinoma Neg Hx        For any of the above entries which indicate no records on file, the patient reports no relevant history.                 Review of Systems:   Pertinent positives are noted in HPI or flowsheet and are otherwise negative.  Patient has been instructed to followup with PCP or appropriate specialist for symptoms outside the purview of this speciality.    OBJECTIVE:     Vitals:     BP 164/86  - Pulse 77  - Temp 37.1 ??C  - Ht 172.7 cm (5' 7.99)  - BMI 26.07 kg/m??      The above medications, allergies, history, and ROS, and vitals have been reviewed.    Physical Exam:   GEN: alert and oriented, no apparent distress  HEENT: normocephalic, atraumatic, anicteric, moist mucous membranes  CV: normal heart rate  PULM: normal work of breathing  EXT: no swelling, edema in b/l UE and LE  SKIN: no visible ecchymosis or breakdown  PSYCH: normal mood and affect    NEURO:   Motor   LUE 5/5 shoulder abductor, 5/5 elbow flexor, 5/5 elbow extender 5/5 wrist extensor, 5/5 wrist flexor, 5/5 finger abductor  RUE 5/5 shoulder abductor, 5/5 elbow flexor, 5/5 elbow extender 5/5 wrist extensor, 5/5 wrist flexor, 5/5 finger abductor  LLE 5/5 hip flexor, 5/5 knee extensor, 5/5 ankle dorsiflexor, 5/5 long toe extensor, 5/5 ankle plantar flexor  RLE 5/5 hip flexor, 5/5 knee extensor, 5/5 ankle dorsiflexor, 0/5 long toe extensor, 5/5 ankle plantar flexor  - Moderate low amplitude, high frequency resting tremor throughout BUE and BLE.    Sensory  Sensation to light touch WNL throughout b/l UE and LE    Reflexes  LUE: 2+ biceps, 2+ triceps, 2+ brachioradialis  RUE: 2+ biceps, 2+ triceps, 2+ brachioradialis  LLE: 2+ patella, 2+ Achilles  RLE: 2+ patella, 2+ Achilles  Hoffman's: - (negative) LUE, - (negative) RUE  Clonus: - (negative) LLE, - (negative) RLE  Special Tests:  Straight Leg Raise: - (negative) LLE, - (negative) RLE    MSK:  Lumbar Spine:  Inspection: Normal alignment. No erythema, discoloration, or asymmetry.  Palpation: No paraspinal muscle tenderness.  No vertebral body point tenderness.  ROM: normal flexion, extension, rotation, lateral bend  Lumbar Spine Directional Preference: mild flexion  Facet Loading Pain: - (negative) left, - (negative) right  Sacroiliac Joint:   Inspection: Normal alignment. No erythema, discoloration, or asymmetry.  Palpation: Moderate tenderness at right PSIS    IMAGING:  MRI Lumbar Spine - 02/21/18  FINDINGS: For the purposes of this dictation, the lowest well formed intervertebral disc space is assumed to be the L5-S1 level, and there are presumed to be five lumbar-type vertebral bodies.  ??  The patient is status post placement of posterior fixation hardware with vertical spanning rods and pedicle screws at T10-L2. Posterior fusion hardware at L5-S1 with lateral pedicle screw and prior S1 laminectomy are also noted. There is again identified a compression deformity of T12 with approximately 30-40% loss of height. The previously described grade 2 anterolisthesis of L5 on S1 is also reidentified. Please note, there is hardware artifact.  ??  The T10-11 through L1-2 levels demonstrate no canal or foraminal stenosis. Significant artifact obscures both the foramina and canal at L2-3. If confirmation of lack of stenosis is necessary at this level, a myelogram would be the  best study.  ??  The L3-4 and L4-5 levels demonstrate no canal or foraminal stenosis. The L5-S1 level demonstrates mild left foraminal stenosis. There is at least mild canal/subarticular zone stenosis at L5-S1 secondary to anterolisthesis.  ??  The signal intensity from the vertebral body bone marrow and spinal cord is normal.   The conus medullaris ends at a normal level.  ??  IMPRESSION:  --Extensive postoperative changes as above with old stable compression fracture at T12.  --Stable grade 2 anterolisthesis of L5 on S1 with associated mild left foraminal and central canal/subarticular zone stenosis.  --Poor visualization at the level of L2-3 as above secondary to metal artifact.    XR Lumbar Spine - 11/17/17  FINDINGS:   Posterior spinal hardware spans T10- L2 with cerclage wires at L1 spanning T12 compression fracture. Similar height loss T12 compared to prior exam. Other vertebral body heights preserved.  ??  Posterior spinal fusion hardware from L5-S1 is unchanged. There is perihardware lucency around the left S1 screw. Multilevel intervertebral disc space narrowing throughout the lumbar spine, most severe between L5-S1. Similar grade 2 anterolisthesis of L5 on S1.  ??  Mild lumbar levoscoliosis. Mild sacroiliac sclerosis.  ??  Extensive surgical clips throughout the abdomen.   ??  IMPRESSION:  Similar appearance of posterior spinal hardware spanning T10-L2 and L5-S1. Lucency surrounding the left S1 screw may reflect loosening, attention on follow-up.  ??  Similar height loss at T12 compression fracture.  ??  Multilevel mid to lower lumbar spondylosis, most prominent at L5-S1 with similar grade 2 anterolisthesis of L5-S1.       I saw and evaluated the patient, participating in the key portions of the service.  I reviewed the resident???s note.  I agree with the resident???s findings and plan.    Nicanor Bake) Glenice [name], MD  Assistant Professor - PM&R  Assistant Professor - Department of Social Medicine  Interventional Spine Specialist - [facility] [place] [facility] [place] [employer]

## 2018-04-09 NOTE — Unmapped (Signed)
Dear Mr. Dejaynes,    Thanks so much for coming to see Dr. Glenice [name] today. It was a pleasure to meet you. This summary reviews the goals and plans we discussed at your visit today.     Below, you will see: a) your working diagnosis b) your treatment plan and c) your next steps and followup plan.    We care about your quality of life and are committed to helping optimize your functionality.     DIAGNOSIS: lumbar spondylosis  TREATMENT PLAN:   - We will get you scheduled for a right SI joint injection to help with your symptoms.  NEXT STEPS/FOLLOW UP:   - We will see you at your injection.    You may contact me through MyChart of call our office with any questions.     If you develop any red flag signs such as new or progressive motor weakness, sensory deficits, saddle anesthesia (groin numbness), bowel/bladder dysfunction, gait/coordination disturbance, weight loss, or night pain, you should call our office and/or seek prompt evaluation at the nearest ER.

## 2018-04-09 NOTE — Unmapped (Signed)
I provided pt. with a copy of the med. list and reviewed to prep for an inj. Pt. will not need to hold anything.  Knows: no driver required, that it is OK to eat prior to appt., to arrive 30 mins. prior to appt. and to call and R/S if there are s/s of illness or if antibiotics have been started for any reason.     At the end of the visit, as pt. was leaving the exam room, he lost his balance and fell, hitting the back of his left hand on the supply cart as he fell. He was assisted back onto the exam bed by me and the resident, Dr. Bernadene Person. I cleaned his hand with peroxide and the dressed it with a non-stick gauze and small Kerlex wrap. He denied any other injury.

## 2018-04-10 NOTE — Unmapped (Signed)
Addended by: Windell Norfolk on: 04/10/2018 08:28 AM     Modules accepted: Level of Service

## 2018-04-13 MED FILL — SILDENAFIL (PULMONARY HYPERTENSION) 20 MG TABLET: 90 days supply | Qty: 180 | Fill #1 | Status: AC

## 2018-04-13 MED FILL — SILDENAFIL (PULMONARY HYPERTENSION) 20 MG TABLET: 90 days supply | Qty: 180 | Fill #1

## 2018-04-17 ENCOUNTER — Ambulatory Visit: Admit: 2018-04-17 | Discharge: 2018-04-17 | Payer: MEDICARE

## 2018-04-17 ENCOUNTER — Ambulatory Visit
Admit: 2018-04-17 | Discharge: 2018-04-17 | Payer: MEDICARE | Attending: Student in an Organized Health Care Education/Training Program | Primary: Student in an Organized Health Care Education/Training Program

## 2018-04-17 DIAGNOSIS — Z79899 Other long term (current) drug therapy: Secondary | ICD-10-CM

## 2018-04-17 DIAGNOSIS — R1311 Dysphagia, oral phase: Secondary | ICD-10-CM

## 2018-04-17 DIAGNOSIS — D899 Disorder involving the immune mechanism, unspecified: Secondary | ICD-10-CM

## 2018-04-17 DIAGNOSIS — K225 Diverticulum of esophagus, acquired: Principal | ICD-10-CM

## 2018-04-17 DIAGNOSIS — Z944 Liver transplant status: Secondary | ICD-10-CM

## 2018-04-17 LAB — BILIRUBIN DIRECT: Bilirubin.glucuronidated:MCnc:Pt:Ser/Plas:Qn:: <0.1

## 2018-04-17 LAB — CBC W/ AUTO DIFF
BASOPHILS ABSOLUTE COUNT: 0 10*9/L (ref 0.0–0.1)
BASOPHILS RELATIVE PERCENT: 0.7 %
EOSINOPHILS ABSOLUTE COUNT: 0.1 10*9/L (ref 0.0–0.4)
EOSINOPHILS RELATIVE PERCENT: 1.4 %
HEMOGLOBIN: 12.5 g/dL — ABNORMAL LOW (ref 13.5–17.5)
LARGE UNSTAINED CELLS: 2 % (ref 0–4)
LYMPHOCYTES ABSOLUTE COUNT: 1.5 10*9/L (ref 1.5–5.0)
MEAN CORPUSCULAR HEMOGLOBIN CONC: 34 g/dL (ref 31.0–37.0)
MEAN CORPUSCULAR HEMOGLOBIN: 30.3 pg (ref 26.0–34.0)
MEAN CORPUSCULAR VOLUME: 89.2 fL (ref 80.0–100.0)
MEAN PLATELET VOLUME: 8.9 fL (ref 7.0–10.0)
MONOCYTES ABSOLUTE COUNT: 0.2 10*9/L (ref 0.2–0.8)
MONOCYTES RELATIVE PERCENT: 4.8 %
NEUTROPHILS ABSOLUTE COUNT: 2.7 10*9/L (ref 2.0–7.5)
NEUTROPHILS RELATIVE PERCENT: 58.4 %
PLATELET COUNT: 117 10*9/L — ABNORMAL LOW (ref 150–440)
RED BLOOD CELL COUNT: 4.12 10*12/L — ABNORMAL LOW (ref 4.50–5.90)
RED CELL DISTRIBUTION WIDTH: 14.1 % (ref 12.0–15.0)
WBC ADJUSTED: 4.6 10*9/L (ref 4.5–11.0)

## 2018-04-17 LAB — COMPREHENSIVE METABOLIC PANEL
ALBUMIN: 3.8 g/dL (ref 3.5–5.0)
ALKALINE PHOSPHATASE: 106 U/L (ref 38–126)
ALT (SGPT): 10 U/L (ref <50)
ANION GAP: 13 mmol/L (ref 7–15)
AST (SGOT): 13 U/L — ABNORMAL LOW (ref 19–55)
BILIRUBIN TOTAL: 0.3 mg/dL (ref 0.0–1.2)
BLOOD UREA NITROGEN: 38 mg/dL — ABNORMAL HIGH (ref 7–21)
BUN / CREAT RATIO: 21
CALCIUM: 10.4 mg/dL — ABNORMAL HIGH (ref 8.5–10.2)
CHLORIDE: 106 mmol/L (ref 98–107)
CREATININE: 1.79 mg/dL — ABNORMAL HIGH (ref 0.70–1.30)
EGFR CKD-EPI AA MALE: 44 mL/min/{1.73_m2} — ABNORMAL LOW (ref >=60)
EGFR CKD-EPI NON-AA MALE: 38 mL/min/{1.73_m2} — ABNORMAL LOW (ref >=60)
GLUCOSE RANDOM: 189 mg/dL — ABNORMAL HIGH (ref 65–179)
POTASSIUM: 5 mmol/L (ref 3.5–5.0)
PROTEIN TOTAL: 6.6 g/dL (ref 6.5–8.3)
SODIUM: 143 mmol/L (ref 135–145)

## 2018-04-17 LAB — CO2: Carbon dioxide:SCnc:Pt:Ser/Plas:Qn:: 24

## 2018-04-17 LAB — CYCLOSPORINE, TROUGH: Lab: 76 — ABNORMAL LOW

## 2018-04-17 LAB — GAMMA GLUTAMYL TRANSFERASE: Gamma glutamyl transferase:CCnc:Pt:Ser/Plas:Qn:: 39

## 2018-04-17 LAB — PHOSPHORUS: Phosphate:MCnc:Pt:Ser/Plas:Qn:: 3.4

## 2018-04-17 LAB — MEAN PLATELET VOLUME: Lab: 8.9

## 2018-04-17 LAB — MAGNESIUM: Magnesium:MCnc:Pt:Ser/Plas:Qn:: 1.8

## 2018-04-17 NOTE — Unmapped (Signed)
Left VM for patient to repeat labs this week or next week.  Spoke with wife and she agreed to go tomorrow.

## 2018-04-17 NOTE — Unmapped (Signed)
Flexible endoscope serial number  [id number]  used today by Rupali Shah, MD

## 2018-04-18 NOTE — Unmapped (Signed)
Confirmed with wife patient is on Neoral 175 mg BID.  Diet is regular he is still coughing when he eats but is following closely with his ENT. ??He has fallen twice one week ago. ??(Wants Lyrica re prescribed but was on OxyContin at the time is now off of it). Messaged PharmD Mincemoyer and NP Martin-Velez to decide on plan.     Patient to increase Neoral to 200 mg in am and 175 mg in pm.  Messaged PCP to request they prescribe Lyrica if the deem neccessary for patient diabetic neuropathy pain via letter. Per Dr. Matilde Haymaker next MRI for [facility] monitor will be in July 2021.  Wife verbalized understanding.

## 2018-04-19 ENCOUNTER — Ambulatory Visit: Admit: 2018-04-19 | Discharge: 2018-04-20 | Payer: MEDICARE

## 2018-04-19 DIAGNOSIS — Z79899 Other long term (current) drug therapy: Secondary | ICD-10-CM

## 2018-04-19 DIAGNOSIS — Z8505 Personal history of malignant neoplasm of liver: Secondary | ICD-10-CM

## 2018-04-19 DIAGNOSIS — K750 Abscess of liver: Secondary | ICD-10-CM

## 2018-04-19 DIAGNOSIS — R935 Abnormal findings on diagnostic imaging of other abdominal regions, including retroperitoneum: Secondary | ICD-10-CM

## 2018-04-19 DIAGNOSIS — Z944 Liver transplant status: Principal | ICD-10-CM

## 2018-04-19 DIAGNOSIS — D899 Disorder involving the immune mechanism, unspecified: Secondary | ICD-10-CM

## 2018-04-19 NOTE — Unmapped (Signed)
MRI with Cardiac Implanted Electronic Device  Programming before and after MRI     TESTING:       Sensing (mV):      RV PRE: 8.2  POST: 9.8    Capture Threshold (V@_ms ):    RV PRE: 0.50V@ 0.91ms POST: 0.50V@0 .24ms            Impedance (between 200-1500ohms):     RV PRE:  490 POST: 500                Programming Changes During MRI    Pacemaker Dependent:  NO  MRI SureScan programmed ON and CIED programmed to OVO     SETTINGS RESTORED TO PREVIOUS POST SCAN    Follow-up advised in 3-6 months after MRI unless earlier follow-up (within a week) is indicated for the following:  Any capture threshold increase >1.0 V, sensing drop >50%, pacing impedance change >50 ohms, or shock impedance change >5 ohms

## 2018-04-20 MED ORDER — NEORAL 100 MG CAPSULE
11 refills | 0 days | Status: CP
Start: 2018-04-20 — End: 2018-04-23

## 2018-04-20 MED ORDER — NEORAL 25 MG CAPSULE
11 refills | 0 days | Status: CP
Start: 2018-04-20 — End: 2018-04-23

## 2018-04-23 MED ORDER — NEORAL 25 MG CAPSULE
11 refills | 0 days | Status: CP
Start: 2018-04-23 — End: 2018-06-12

## 2018-04-23 MED ORDER — NEORAL 100 MG CAPSULE
11 refills | 0 days | Status: CP
Start: 2018-04-23 — End: 2018-06-12
  Filled 2018-05-08: qty 90, 30d supply, fill #0

## 2018-04-23 NOTE — Unmapped (Signed)
NEORAL DOSE INCREASE   TEST CLAIM $0.00 EACH

## 2018-04-23 NOTE — Unmapped (Signed)
New rx received at [facility] for dosage change of prograf to 200/175.  Per epic note on 2/5, patient/wife aware of this change.   Due to increase, moving clinical call up to this week (originally scheduled for next week)

## 2018-04-27 MED FILL — MG-PLUS-PROTEIN 133 MG TABLET: 25 days supply | Qty: 100 | Fill #1 | Status: AC

## 2018-04-27 MED FILL — MG-PLUS-PROTEIN 133 MG TABLET: ORAL | 25 days supply | Qty: 100 | Fill #1

## 2018-04-30 ENCOUNTER — Other Ambulatory Visit: Admit: 2018-04-30 | Discharge: 2018-05-01 | Payer: MEDICARE

## 2018-04-30 DIAGNOSIS — D899 Disorder involving the immune mechanism, unspecified: Secondary | ICD-10-CM

## 2018-04-30 DIAGNOSIS — Z944 Liver transplant status: Secondary | ICD-10-CM

## 2018-04-30 DIAGNOSIS — Z79899 Other long term (current) drug therapy: Secondary | ICD-10-CM

## 2018-04-30 LAB — COMPREHENSIVE METABOLIC PANEL
ALBUMIN: 4.1 g/dL (ref 3.5–5.0)
ALKALINE PHOSPHATASE: 110 U/L (ref 38–126)
ALT (SGPT): 9 U/L (ref <50)
ANION GAP: 7 mmol/L (ref 7–15)
AST (SGOT): 12 U/L — ABNORMAL LOW (ref 19–55)
BILIRUBIN TOTAL: 0.7 mg/dL (ref 0.0–1.2)
BLOOD UREA NITROGEN: 40 mg/dL — ABNORMAL HIGH (ref 7–21)
BUN / CREAT RATIO: 21
CALCIUM: 10.7 mg/dL — ABNORMAL HIGH (ref 8.5–10.2)
CHLORIDE: 110 mmol/L — ABNORMAL HIGH (ref 98–107)
CO2: 23 mmol/L (ref 22.0–30.0)
CREATININE: 1.94 mg/dL — ABNORMAL HIGH (ref 0.70–1.30)
EGFR CKD-EPI AA MALE: 39 mL/min/{1.73_m2} — ABNORMAL LOW (ref >=60)
EGFR CKD-EPI NON-AA MALE: 34 mL/min/{1.73_m2} — ABNORMAL LOW (ref >=60)
GLUCOSE RANDOM: 187 mg/dL — ABNORMAL HIGH (ref 65–179)
POTASSIUM: 5.2 mmol/L — ABNORMAL HIGH (ref 3.5–5.0)
PROTEIN TOTAL: 6.8 g/dL (ref 6.5–8.3)

## 2018-04-30 LAB — CBC W/ AUTO DIFF
BASOPHILS ABSOLUTE COUNT: 0 10*9/L (ref 0.0–0.1)
EOSINOPHILS ABSOLUTE COUNT: 0.1 10*9/L (ref 0.0–0.4)
EOSINOPHILS RELATIVE PERCENT: 1.2 %
HEMATOCRIT: 39 % — ABNORMAL LOW (ref 41.0–53.0)
HEMOGLOBIN: 13.3 g/dL — ABNORMAL LOW (ref 13.5–17.5)
LARGE UNSTAINED CELLS: 2 % (ref 0–4)
LYMPHOCYTES ABSOLUTE COUNT: 1.3 10*9/L — ABNORMAL LOW (ref 1.5–5.0)
MEAN CORPUSCULAR HEMOGLOBIN CONC: 34.2 g/dL (ref 31.0–37.0)
MEAN CORPUSCULAR HEMOGLOBIN: 30.5 pg (ref 26.0–34.0)
MEAN CORPUSCULAR VOLUME: 89.2 fL (ref 80.0–100.0)
MEAN PLATELET VOLUME: 8.9 fL (ref 7.0–10.0)
MONOCYTES ABSOLUTE COUNT: 0.2 10*9/L (ref 0.2–0.8)
MONOCYTES RELATIVE PERCENT: 5 %
NEUTROPHILS ABSOLUTE COUNT: 3 10*9/L (ref 2.0–7.5)
NEUTROPHILS RELATIVE PERCENT: 64.3 %
PLATELET COUNT: 83 10*9/L — ABNORMAL LOW (ref 150–440)
WBC ADJUSTED: 4.6 10*9/L (ref 4.5–11.0)

## 2018-04-30 LAB — PHOSPHORUS: Phosphate:MCnc:Pt:Ser/Plas:Qn:: 3

## 2018-04-30 LAB — ANION GAP: Anion gap 3:SCnc:Pt:Ser/Plas:Qn:: 7

## 2018-04-30 LAB — BILIRUBIN DIRECT: Bilirubin.glucuronidated:MCnc:Pt:Ser/Plas:Qn:: 0.2

## 2018-04-30 LAB — GAMMA GLUTAMYL TRANSFERASE: Gamma glutamyl transferase:CCnc:Pt:Ser/Plas:Qn:: 36

## 2018-04-30 LAB — MAGNESIUM: Magnesium:MCnc:Pt:Ser/Plas:Qn:: 1.8

## 2018-04-30 LAB — NEUTROPHILS RELATIVE PERCENT: Lab: 64.3

## 2018-05-01 LAB — CYCLOSPORINE, TROUGH: Lab: 95 — ABNORMAL LOW

## 2018-05-02 NOTE — Unmapped (Signed)
Per NP Martin-Velez patient CSA level okay and goal should be close to 100.

## 2018-05-07 ENCOUNTER — Other Ambulatory Visit: Admit: 2018-05-07 | Discharge: 2018-05-08 | Payer: MEDICARE

## 2018-05-07 DIAGNOSIS — Z79899 Other long term (current) drug therapy: Secondary | ICD-10-CM

## 2018-05-07 DIAGNOSIS — D899 Disorder involving the immune mechanism, unspecified: Secondary | ICD-10-CM

## 2018-05-07 DIAGNOSIS — Z944 Liver transplant status: Secondary | ICD-10-CM

## 2018-05-07 LAB — BILIRUBIN DIRECT: Bilirubin.glucuronidated:MCnc:Pt:Ser/Plas:Qn:: 0.1

## 2018-05-07 LAB — COMPREHENSIVE METABOLIC PANEL
ALBUMIN: 3.9 g/dL (ref 3.5–5.0)
ALKALINE PHOSPHATASE: 103 U/L (ref 38–126)
ALT (SGPT): 10 U/L (ref <50)
ANION GAP: 8 mmol/L (ref 7–15)
BILIRUBIN TOTAL: 0.6 mg/dL (ref 0.0–1.2)
BLOOD UREA NITROGEN: 39 mg/dL — ABNORMAL HIGH (ref 7–21)
BUN / CREAT RATIO: 23
CALCIUM: 10 mg/dL (ref 8.5–10.2)
CHLORIDE: 106 mmol/L (ref 98–107)
CO2: 24 mmol/L (ref 22.0–30.0)
CREATININE: 1.73 mg/dL — ABNORMAL HIGH (ref 0.70–1.30)
EGFR CKD-EPI AA MALE: 45 mL/min/{1.73_m2} — ABNORMAL LOW (ref >=60)
EGFR CKD-EPI NON-AA MALE: 39 mL/min/{1.73_m2} — ABNORMAL LOW (ref >=60)
GLUCOSE RANDOM: 130 mg/dL (ref 65–179)
POTASSIUM: 5.1 mmol/L — ABNORMAL HIGH (ref 3.5–5.0)
PROTEIN TOTAL: 6.7 g/dL (ref 6.5–8.3)

## 2018-05-07 LAB — CBC W/ AUTO DIFF
BASOPHILS ABSOLUTE COUNT: 0 10*9/L (ref 0.0–0.1)
BASOPHILS RELATIVE PERCENT: 0.2 %
EOSINOPHILS ABSOLUTE COUNT: 0 10*9/L (ref 0.0–0.4)
EOSINOPHILS RELATIVE PERCENT: 1.1 %
HEMATOCRIT: 37.2 % — ABNORMAL LOW (ref 41.0–53.0)
LARGE UNSTAINED CELLS: 1 % (ref 0–4)
LYMPHOCYTES ABSOLUTE COUNT: 1.4 10*9/L — ABNORMAL LOW (ref 1.5–5.0)
LYMPHOCYTES RELATIVE PERCENT: 32.5 %
MEAN CORPUSCULAR HEMOGLOBIN CONC: 34.7 g/dL (ref 31.0–37.0)
MEAN CORPUSCULAR HEMOGLOBIN: 31 pg (ref 26.0–34.0)
MEAN CORPUSCULAR VOLUME: 89.3 fL (ref 80.0–100.0)
MEAN PLATELET VOLUME: 8.9 fL (ref 7.0–10.0)
MONOCYTES ABSOLUTE COUNT: 0.3 10*9/L (ref 0.2–0.8)
NEUTROPHILS ABSOLUTE COUNT: 2.4 10*9/L (ref 2.0–7.5)
NEUTROPHILS RELATIVE PERCENT: 58.8 %
PLATELET COUNT: 75 10*9/L — ABNORMAL LOW (ref 150–440)
RED BLOOD CELL COUNT: 4.17 10*12/L — ABNORMAL LOW (ref 4.50–5.90)
RED CELL DISTRIBUTION WIDTH: 14.2 % (ref 12.0–15.0)
WBC ADJUSTED: 4.2 10*9/L — ABNORMAL LOW (ref 4.5–11.0)

## 2018-05-07 LAB — WBC ADJUSTED: Lab: 4.2 — ABNORMAL LOW

## 2018-05-07 LAB — ANION GAP: Anion gap 3:SCnc:Pt:Ser/Plas:Qn:: 8

## 2018-05-07 LAB — GAMMA GLUTAMYL TRANSFERASE: Gamma glutamyl transferase:CCnc:Pt:Ser/Plas:Qn:: 35

## 2018-05-07 LAB — MAGNESIUM: Magnesium:MCnc:Pt:Ser/Plas:Qn:: 1.8

## 2018-05-07 LAB — PHOSPHORUS: Phosphate:MCnc:Pt:Ser/Plas:Qn:: 2.9

## 2018-05-07 NOTE — Unmapped (Signed)
[facility] Clinical Assessment & Refill Coordination Note    Jeffrey Ward, DOB: Nov 19, 1950  Phone: [phone] (home)     All above HIPAA information was verified with patient's caregiver. wife    Specialty Medication(s):   Transplant:  mycophenolic acid 250mg , Neoral 100mg  and Neoral 25mg      Current Outpatient Medications   Medication Sig Dispense Refill   ??? alendronate (FOSAMAX) 70 MG tablet Take 1 tablet (70 mg total) by mouth every seven (7) days. 4 tablet 11   ??? bisacodyl (DULCOLAX, BISACODYL,) 5 mg EC tablet Take 5 mg by mouth daily as needed.      ??? blood sugar diagnostic Strp Use three (3) times a day before meals. 100 each 7   ??? blood-glucose meter (GLUCOSE MONITORING KIT) kit Use as instructed 1 each 0   ??? calcium carbonate (OS-CAL) 500 mg calcium (1,250 mg) chewable tablet Chew 2 tablets Two (2) times a day.      ??? docusate sodium (COLACE) 100 MG capsule TAKE 1 CAPSULE BY MOUTH TWICE DAILY (Patient not taking: Reported on 05/07/2018) 60 capsule 0   ??? ferrous sulfate 325 (65 FE) MG tablet Take 325 mg by mouth.     ??? insulin glargine (BASAGLAR, LANTUS) 100 unit/mL (3 mL) injection pen INJECT 12 UNITS SUBCUTANEOUSLY (UNDER SKIN) NIGHTLY 15 mL 10   ??? magnesium oxide-Mg AA chelate (MAGNESIUM, AMINO ACID CHELATE,) 133 mg Tab Take 2 tablets by mouth Two (2) times a day. 100 tablet PRN   ??? melatonin 3 mg Tab Take 1 tablet (3 mg total) by mouth nightly. 30 tablet 0   ??? mirtazapine (REMERON) 15 MG tablet TAKE 1 TABLET BY MOUTH NIGHTLY 90 tablet 3   ??? mycophenolate (CELLCEPT) 250 mg capsule TAKE 2 CAPSULES (500MG ) BY MOUTH TWICE DAILY 120 capsule 7   ??? NEORAL 100 mg capsule Take 2 capsules (200 mg) in the morning and 1 capsule with three 25mg  capsules for a total daily dose of 200mg  each morning and 175mg  every evening 90 each 11   ??? NEORAL 25 mg capsule Take 3 capsules (75mg ) along with a 100mg  capsule every evening for a total dose of 175mg  every evening (Take two 100mg  capsules in the morning) 90 each 11   ??? nitrofurantoin, macrocrystal-monohydrate, (MACROBID) 100 MG capsule Take 1 capsule (100 mg total) by mouth twice daily until gone. (Patient not taking: Reported on 05/07/2018) 13 capsule 0   ??? pen needle, diabetic 32 gauge x 5/32 Ndle USE WITH INSULIN PEN 4 TIMES DAILY AS DIRECTED 100 each 1     No current facility-administered medications for this visit.         Changes to medications: Jeffrey Ward reports no changes reported at this time.    No Known Allergies    Changes to allergies: No    SPECIALTY MEDICATION ADHERENCE     neoral 100 mg: 7 days of medicine on hand   Neoral  25 mg: 7 days of medicine on hand   mycophenolate 350 mg: 7 days of medicine on hand       Medication Adherence    Specialty Medication:  neoral 100mg   Patient is on more than two specialty medications:  Yes  Specialty Medication:  neoral 25mg   Patient Reported Additional Medication X Missed Doses in the Last Month:  0  Specialty Medication:  mycophenolate 250mg   Patient Reported Additional Medication X Missed Doses in the Last Month:  0  Support network for adherence:  family member          Specialty medication(s) dose(s) confirmed: Patient reports changes to the regimen as follows: neoral is now 200/175 - pt wife aware and new rx on file     Are there any concerns with adherence? No    Adherence counseling provided? Not needed    CLINICAL MANAGEMENT AND INTERVENTION      Clinical Benefit Assessment:    Do you feel the medicine is effective or helping your condition? Yes    Clinical Benefit counseling provided? Not needed    Adverse Effects Assessment:    Are you experiencing any side effects? No    Are you experiencing difficulty administering your medicine? No    Quality of Life Assessment:    How many days over the past month did your transplant  keep you from your normal activities? For example, brushing your teeth or getting up in the morning. 0    Have you discussed this with your provider? Not needed Therapy Appropriateness:    Is therapy appropriate? Yes, therapy is appropriate and should be continued    DISEASE/MEDICATION-SPECIFIC INFORMATION      N/A    PATIENT SPECIFIC NEEDS     ? Does the patient have any physical, cognitive, or cultural barriers? No    ? Is the patient high risk? No     ? Does the patient require a Care Management Plan? No     ? Does the patient require physician intervention or other additional services (i.e. nutrition, smoking cessation, social work)? No      SHIPPING     Specialty Medication(s) to be Shipped:   Transplant:  mycophenolic acid 100mg , Neoral 25mg  and Neoral 250mg     Other medication(s) to be shipped: basaglar (needs refills), lancets (needs new rx), test strips (needs refills), mirtazapine, alendronate- 8rx total     Changes to insurance: No    Delivery Scheduled: Yes, Expected medication delivery date: 05/09/2018.  However, Rx request for refills was sent to the provider as there are none remaining.     Medication will be delivered via UPS to the confirmed prescription address in [facility].    The patient will receive a drug information handout for each medication shipped and additional FDA Medication Guides as required.  Verified that patient has previously received a Conservation officer, historic buildings.    Thad Ranger   [facility] Specialty Pharmacist

## 2018-05-08 LAB — CYCLOSPORINE, TROUGH: Lab: 124

## 2018-05-08 MED FILL — NEORAL 25 MG CAPSULE: 30 days supply | Qty: 90 | Fill #0 | Status: AC

## 2018-05-08 MED FILL — NEORAL 100 MG CAPSULE: 30 days supply | Qty: 90 | Fill #0

## 2018-05-08 MED FILL — MIRTAZAPINE 15 MG TABLET: ORAL | 90 days supply | Qty: 90 | Fill #1

## 2018-05-08 MED FILL — ALENDRONATE 70 MG TABLET: ORAL | 28 days supply | Qty: 4 | Fill #2

## 2018-05-08 MED FILL — ALENDRONATE 70 MG TABLET: 28 days supply | Qty: 4 | Fill #2 | Status: AC

## 2018-05-08 MED FILL — MYCOPHENOLATE MOFETIL 250 MG CAPSULE: 30 days supply | Qty: 120 | Fill #1

## 2018-05-08 MED FILL — MYCOPHENOLATE MOFETIL 250 MG CAPSULE: 30 days supply | Qty: 120 | Fill #1 | Status: AC

## 2018-05-08 MED FILL — MIRTAZAPINE 15 MG TABLET: 90 days supply | Qty: 90 | Fill #1 | Status: AC

## 2018-05-08 MED FILL — NEORAL 100 MG CAPSULE: 30 days supply | Qty: 90 | Fill #0 | Status: AC

## 2018-05-09 NOTE — Unmapped (Signed)
Sent PCP refill request for diabetic supplies. Notified NP Martin-Velez of patient PLT trending down via [facility] waiting for response.     Patient has not felt well since he had surgery on this throat.  Patient has been vomiting (1-2 x week) which is now improving.  Stated Dr. Sherryll Burger is aware and he has apt. For swallowing test on Monday.  Per NP Martin-Velez verified patient without bleeding symptoms even though his PLT count is trending down. ??Per NP no intervention for CSA level of 124.  Confirmed with wife refills for diabetic supplies should come to PCP.  Wife agreed to repeat labs in one month.

## 2018-05-09 NOTE — Unmapped (Signed)
Jeffrey Ward 's basaglar, test strips, and lancets shipment will be delayed due to refill request denied We have contacted the patient and made him aware of the denial We will call the patient to reschedule the delivery upon resolution. We have confirmed the delivery date as NA .

## 2018-05-13 NOTE — Unmapped (Signed)
Otolaryngology     Reason for visit:  Follow up     History of Present Illness:     Recall, initial presentation on 01/30/17 Jeffrey Ward is a 68 y.o. 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 He spent 3 months in the hospital following his transplant he had respiratory compromise and ultimately had tracheostomy tube for much of that stay. He was decannulated in September before discharge home. 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  Patient reports he lost his voice following liver transplant episode 3 months ago. He states that since surgery his voice is breathy and weak. He cannot control his pitch. His voice symptoms do not fluctuate throughout the day and he has not noticed anything that makes it better or worse. He does have a cough throughout the day. He also endorses globus sensation. He denies dysphagia. He is maintained on an oral diet but has not had his G-tube removed yet due to surgeon preference. He has never had voice issues before and has never seen an ENT doctor for any related issues. He does endorse consistent shortness of breath with exertion and notes that this is likely secondary to his recovery process from his liver transplant. She does take Prilosec for his GERD. Thinks that this is stable. No coughing or choking while eating.    He was noted to have a right vocal fold paralysis with significant breathy voice and glottal insufficiency.   For multilpe reasons intervention was delayed.   He has had quite a complicated course.     He did have anemia and hemothorax.     He underwent in office injection cymetra on 05/31/17 0.4ml right fold.     Since then, he has had multiple set backs in his GI care. He had a fall and possible liver abscess. He underwent intubation and ERCP in April. He has been admitted to the hospital twice.     08/08/17: He returns today in follow-up.  He reports no significant improvement in the voice following injection.  He continues to be weak and breathy.  He is also had increasing coughing on foods and liquids.  No pneumonia.  He has had significant illnesses since his injection however as stated above.    He was taken back to [facility] on 09/13/17 for in office [facility] injection TTH cymetra 0.14ml.     10/03/17:   He returns after injection. The voice improved but by only about 50%. The voice has started to decline. He also complains of coughing while eating. This has not changed with injection augmentation. He does state he is louder and easier, but still weak from voice standpoint.    10.21.19:   Follows up for reassessment. Per wife, voice has remained stable since last visit. Improved compared to prior to injection but still with breathiness and vocal fatigue. He was unable to complete voice therpay. He had Hip fracture surgery recently and was intubated for this. Wife states still with some coughing after meals. No regurgitation after meals. Patient states imprpoved.     03/13/18: He comes in today for preop for voice surgery however he states his swallowing has become more of an issue.  He continues to get choked on solid foods and has to regurgitate to swallow things down.  He feels like this is getting worse and is currently a larger issue than the voice.     04/04/18: OR for endoscopic co2 laser zenker diverticulotomy and botox CP     04/17/18: he comes in today for postop. His postop swallow study showed no extravasation and was dishcarged home on pod 1 stable. He denies fevers, chest pain, tachycardia. He continues to have a cough.       Past Medical History:  Past Medical History:   Diagnosis Date   ??? Atrial fibrillation (CMS-HCC)    ??? Basal cell carcinoma    ??? Cancer (CMS-HCC)    ??? Cirrhosis (CMS-HCC)    ??? Depression    ??? Diabetes (CMS-HCC) ??? Headache    ??? Hepatitis C 07/17/2012   ??? History of transfusion    ??? Liver disease    ??? Low back pain 07/17/2012   ??? Varices, esophageal (CMS-HCC)        Past Surgical History:  Past Surgical History:   Procedure Laterality Date   ??? ANKLE SURGERY     ??? BACK SURGERY     ??? BACK SURGERY     ??? CARDIAC SURGERY      pacemaker   ??? CHG US GUIDE, TISSUE ABLATION N/A 01/22/2016    Procedure: ULTRASOUND GUIDANCE FOR, AND MONITORING OF, PARENCHYMAL TISSUE ABLATION;  Surgeon: Particia Nearing, MD;  Location: MAIN OR [facility];  Service: Transplant   ??? IR EMBOLIZATION ORGAN ISCHEMIA, TUMORS, INFAR  06/16/2016    IR EMBOLIZATION ORGAN ISCHEMIA, TUMORS, INFAR 06/16/2016 Ammie Dalton, MD IMG VIR [facility]   ??? PR COLON CA SCRN NOT HI RSK IND N/A 02/27/2015    Procedure: COLOREC CNCR SCR;COLNSCPY NO;  Surgeon: Vonda Antigua, MD;  Location: GI PROCEDURES MEMORIAL [facility];  Service: Gastroenterology   ??? PR ENDOSCOPIC ULTRASOUND EXAM N/A 02/27/2015    Procedure: UGI ENDO; W/ENDO ULTRASOUND EXAM INCLUDES ESOPHAGUS, STOMACH, &/OR DUODENUM/JEJUNUM;  Surgeon: Vonda Antigua, MD;  Location: GI PROCEDURES MEMORIAL [facility];  Service: Gastroenterology   ??? PR ERCP BALLOON DILATE BILIARY/PANC DUCT/AMPULLA EA N/A 02/10/2017    Procedure: ERCP;WITH TRANS-ENDOSCOPIC BALLOON DILATION OF BILIARY/PANCREATIC DUCT(S) OR OF AMPULLA, INCLUDING SPHINCTERECTOMY, WHEN PERFOREMD,EACH DUCT (09811);  Surgeon: Mayford Knife, MD;  Location: GI PROCEDURES MEMORIAL [facility];  Service: Gastroenterology   ??? PR ERCP REMOVE FOREIGN BODY/STENT BILIARY/PANC DUCT N/A 02/10/2017    Procedure: ENDOSCOPIC RETROGRADE CHOLANGIOPANCREATOGRAPHY (ERCP); W/ REMOVAL OF FOREIGN BODY/STENT FROM BILIARY/PANCREATIC DUCT(S);  Surgeon: Mayford Knife, MD;  Location: GI PROCEDURES MEMORIAL [facility];  Service: Gastroenterology   ??? PR ERCP REMOVE FOREIGN BODY/STENT BILIARY/PANC DUCT N/A 06/29/2017    Procedure: ENDOSCOPIC RETROGRADE CHOLANGIOPANCREATOGRAPHY (ERCP); W/ REMOVAL OF FOREIGN BODY/STENT FROM BILIARY/PANCREATIC DUCT(S);  Surgeon: Vonda Antigua, MD;  Location: GI PROCEDURES MEMORIAL [facility];  Service: Gastroenterology   ??? PR ERCP STENT PLACEMENT BILIARY/PANCREATIC DUCT N/A 11/29/2016    Procedure: ENDOSCOPIC RETROGRADE CHOLANGIOPANCREATOGRAPHY (ERCP); WITH PLACEMENT OF ENDOSCOPIC STENT INTO BILIARY OR PANCREATIC DUCT;  Surgeon: Chriss Driver, MD;  Location: GI PROCEDURES MEMORIAL [facility];  Service: Gastroenterology   ??? PR ERCP STENT PLACEMENT BILIARY/PANCREATIC DUCT N/A 04/26/2017    Procedure: ENDOSCOPIC RETROGRADE CHOLANGIOPANCREATOGRAPHY (ERCP); WITH PLACEMENT OF ENDOSCOPIC STENT INTO BILIARY OR PANCREATIC DUCT;  Surgeon: Mayford Knife, MD;  Location: GI PROCEDURES MEMORIAL [facility];  Service: Gastroenterology   ??? PR ERCP,W/REMOVAL STONE,BIL/PANCR DUCTS N/A 11/29/2016    Procedure: ERCP; W/ENDOSCOPIC RETROGRADE REMOVAL OF CALCULUS/CALCULI FROM BILIARY &/OR PANCREATIC DUCTS;  Surgeon: Chriss Driver, MD;  Location:  GI PROCEDURES MEMORIAL [facility];  Service: Gastroenterology   ??? PR ERCP,W/REMOVAL STONE,BIL/PANCR DUCTS N/A 02/10/2017    Procedure: ERCP; W/ENDOSCOPIC RETROGRADE REMOVAL OF CALCULUS/CALCULI FROM BILIARY &/OR PANCREATIC DUCTS;  Surgeon: Mayford Knife, MD;  Location: GI PROCEDURES MEMORIAL [facility];  Service: Gastroenterology   ??? PR ERCP,W/REMOVAL STONE,BIL/PANCR DUCTS N/A 04/26/2017    Procedure: ERCP; W/ENDOSCOPIC RETROGRADE REMOVAL OF CALCULUS/CALCULI FROM BILIARY &/OR PANCREATIC DUCTS;  Surgeon: Mayford Knife, MD;  Location: GI PROCEDURES MEMORIAL [facility];  Service: Gastroenterology   ??? PR ERCP,W/REMOVAL STONE,BIL/PANCR DUCTS N/A 06/29/2017    Procedure: ERCP; W/ENDOSCOPIC RETROGRADE REMOVAL OF CALCULUS/CALCULI FROM BILIARY &/OR PANCREATIC DUCTS;  Surgeon: Vonda Antigua, MD;  Location: GI PROCEDURES MEMORIAL [facility];  Service: Gastroenterology   ??? PR ESOPHAGOSCP RIG TRANSORAL HYPOPHARYNX CRV ESOPH N/A 04/04/2018    Procedure: ESOPHAGOSCOPY, RIGID, TRANSORAL WITH DIVERTICULECTOMY OF HYPOPHARYNX OR CERVICAL ESOPHAGUS, WITH CRICOPHARYNGEAL MYOTOMY, INCLUDES USE OF TELESCOPE OR OPERATING MICROSCOPE AND REPAIR, WHEN PERFORMED;  Surgeon: Vista Deck, MD;  Location: MAIN OR [facility];  Service: ENT   ??? PR INSER HEART TEMP PACER ONE CHMBR N/A 10/02/2016    Procedure: Tempoarary Pacemaker Insertion;  Surgeon: Meredith Leeds, MD;  Location: [facility];  Service: Cardiology   ??? PR LAP,DIAGNOSTIC ABDOMEN N/A 01/22/2016    Procedure: Laparoscopy, Abdomen, Peritoneum, & Omentum, Diagnostic, W/Wo Collection Specimen(S) By Brushing Or Washing;  Surgeon: Particia Nearing, MD;  Location: MAIN OR [facility];  Service: Transplant   ??? PR PLACE PERCUT GASTROSTOMY TUBE N/A 11/17/2016    Procedure: UGI ENDO; W/DIRECTED PLCMT PERQ GASTROSTOMY TUBE;  Surgeon: Cletis Athens, MD;  Location: GI PROCEDURES MEMORIAL [facility];  Service: Gastroenterology   ??? PR TRACHEOSTOMY, PLANNED N/A 09/29/2016    Procedure: TRACHEOSTOMY PLANNED (SEPART PROC);  Surgeon: Katherina Mires, MD;  Location: MAIN OR [facility];  Service: Trauma   ??? PR TRANSCATH INSERT OR REPLACE LEADLESS PM VENTR N/A 10/11/2016    Procedure: Pacemaker Implant/Replace Leadless;  Surgeon: Meredith Leeds, MD;  Location: [facility];  Service: Cardiology   ??? PR TRANSPLANT LIVER,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    Procedure: Liver Allotransplantation; Orthotopic, Partial Or Whole, From Cadaver Or Living Donor, Any Age;  Surgeon: Doyce Loose, MD;  Location: MAIN OR [facility];  Service: Transplant   ??? PR TRANSPLANT,PREP DONOR LIVER, WHOLE N/A 09/15/2016    Procedure: Rogelia Boga Std Prep Cad Donor Whole Liver Gft Prior Tnsplnt,Inc Chole,Diss/Rem Surr Tissu Wo Triseg/Lobe Splt;  Surgeon: Doyce Loose, MD;  Location: MAIN OR [facility];  Service: Transplant   ??? PR UPPER GI ENDOSCOPY,BIOPSY N/A 07/17/2012    Procedure: UGI ENDOSCOPY; WITH BIOPSY, SINGLE OR MULTIPLE;  Surgeon: Alba Destine, MD;  Location: GI PROCEDURES MEMORIAL [facility];  Service: Gastroenterology   ??? PR UPPER GI ENDOSCOPY,DIAGNOSIS N/A 02/04/2014    Procedure: UGI ENDO, INCLUDE ESOPHAGUS, STOMACH, & DUODENUM &/OR JEJUNUM; DX W/WO COLLECTION SPECIMN, BY BRUSH OR WASH;  Surgeon: Wilburt Finlay, MD;  Location: GI PROCEDURES MEMORIAL [facility];  Service: Gastroenterology   ??? PR UPPER GI ENDOSCOPY,LIGAT VARIX N/A 11/05/2013    Procedure: UGI ENDO; W/BAND LIG ESOPH &/OR GASTRIC VARICES;  Surgeon: Wilburt Finlay, MD;  Location: GI PROCEDURES MEMORIAL [facility];  Service: Gastroenterology       Medications:    Current Outpatient Medications:   ???  alendronate (FOSAMAX) 70 MG tablet, Take 1 tablet (70 mg total) by mouth every seven (7) days., Disp: 4 tablet, Rfl: 11  ???  bisacodyl (DULCOLAX, BISACODYL,) 5 mg EC tablet, Take 5 mg  by mouth daily as needed. , Disp: , Rfl:   ???  blood sugar diagnostic Strp, Use three (3) times a day before meals., Disp: 100 each, Rfl: 7  ???  blood-glucose meter (GLUCOSE MONITORING KIT) kit, Use as instructed, Disp: 1 each, Rfl: 0  ???  calcium carbonate (OS-CAL) 500 mg calcium (1,250 mg) chewable tablet, Chew 2 tablets Two (2) times a day. , Disp: , Rfl:   ???  docusate sodium (COLACE) 100 MG capsule, TAKE 1 CAPSULE BY MOUTH TWICE DAILY (Patient not taking: Reported on 05/07/2018), Disp: 60 capsule, Rfl: 0  ???  ferrous sulfate 325 (65 FE) MG tablet, Take 325 mg by mouth., Disp: , Rfl:   ???  insulin glargine (BASAGLAR, LANTUS) 100 unit/mL (3 mL) injection pen, INJECT 12 UNITS SUBCUTANEOUSLY (UNDER SKIN) NIGHTLY, Disp: 15 mL, Rfl: 10  ???  magnesium oxide-Mg AA chelate (MAGNESIUM, AMINO ACID CHELATE,) 133 mg Tab, Take 2 tablets by mouth Two (2) times a day., Disp: 100 tablet, Rfl: PRN  ???  melatonin 3 mg Tab, Take 1 tablet (3 mg total) by mouth nightly., Disp: 30 tablet, Rfl: 0  ???  mirtazapine (REMERON) 15 MG tablet, TAKE 1 TABLET BY MOUTH NIGHTLY, Disp: 90 tablet, Rfl: 3  ???  mycophenolate (CELLCEPT) 250 mg capsule, TAKE 2 CAPSULES (500MG ) BY MOUTH TWICE DAILY, Disp: 120 capsule, Rfl: 7  ???  pen needle, diabetic 32 gauge x 5/32 Ndle, USE WITH INSULIN PEN 4 TIMES DAILY AS DIRECTED, Disp: 100 each, Rfl: 1  ???  lancets 30 gauge Misc, USE AS DIRECTED TO CHECK BLOOD SUGAR 4 TIMES DAILY ( BEFORE MEALS AND NIGHTLY ), Disp: 100 each, Rfl: 11  ???  NEORAL 100 mg capsule, Take 2 capsules (200 mg) in the morning and 1 capsule with three 25mg  capsules for a total daily dose of 200mg  each morning and 175mg  every evening, Disp: 90 each, Rfl: 11  ???  NEORAL 25 mg capsule, Take 3 capsules (75mg ) along with a 100mg  capsule every evening for a total dose of 175mg  every evening (Take two 100mg  capsules in the morning), Disp: 90 each, Rfl: 11  ???  nitrofurantoin, macrocrystal-monohydrate, (MACROBID) 100 MG capsule, Take 1 capsule (100 mg total) by mouth twice daily until gone. (Patient not taking: Reported on 05/07/2018), Disp: 13 capsule, Rfl: 0     Allergies:  No Known Allergies    Family History:  Family History   Problem Relation Age of Onset   ??? Hypertension Mother    ??? Cancer Mother    ??? Heart disease Mother    ??? Heart disease Father    ??? Cirrhosis Neg Hx    ??? Liver cancer Neg Hx    ??? Anemia Neg Hx    ??? Diabetes Neg Hx    ??? Kidney disease Neg Hx    ??? Obesity Neg Hx    ??? Thyroid disease Neg Hx    ??? Osteoporosis Neg Hx    ??? Coronary artery disease Neg Hx    ??? Anesthesia problems Neg Hx    ??? Basal cell carcinoma Neg Hx    ??? Squamous cell carcinoma Neg Hx      Social History:  Social History     Tobacco Use   ??? Smoking status: Never Smoker   ??? Smokeless tobacco: Never Used   ??? Tobacco comment: Smoked in high school for about 5 years.    Substance Use Topics   ??? Alcohol use: No   ??? Drug use:  No       Review of Systems:  Denies chest pain, sob, fevers, n/v, abd pain      Physical Exam:   Constitutional:  Vitals reviewed in chart, patient has normal appearance. Well nourished, well-developed, no acute distress  Voice:  Hoarse, breathy, weak voice  Respiration:  Breathing comfortably, no stridor.  CV: No clubbing/cyanosis/edema in hands.   Eyes:  extraocular motion intact, sclera normal.   Neuro:  Alert and oriented times 3, Cranial nerves 2-12 intact and symmetric bilaterally. Except X.  Head and Face:  Skin with no masses or lesions, sinuses nontender to palpation, facial nerve fully intact.   Salivary Glands:  Parotid and submandibular glands normal bilaterally.   Ears:  Normal external ears  Nose:  External nose midline, anterior rhinoscopy is normal with limited visualization just to the anterior interior turbinate.   Oral Cavity/Oropharynx/Lips:  Normal mucous membranes, normal floor of mouth/tongue/oropharynx, no masses or lesions are noted.  Good dentition  Pharyngeal Walls:  No masses noted.  Neck/Lymph:  No lymphadenopathy, no thyroid masses. Previous tracheostoma still patent with moderate granulation tissue filling the fistula. No erythema or drainage or purulence noted.   Larynx: Mirror evaluation insufficient to visualize secondary to prominent gag      [facility]: 08/29/17:   Assessment: Pt presents with a mild pharyngeal dysphagia c/b reduced BOT retraction which led to mild vallecula residue (all consistencies). Trace post-cricoid residue of solids. Trace penetration x 1 of thin liquids before swallows (cleared). No aspiration observed. Cervical esophagus: mild accumulation of boluses at tissue protrusion (~C6) c/w the appearance of a small Zenker's diverticulum. Occasional mild backflow of boluses from this area.     Assessment:   Mr.Cardale Deremer is a 68 y.o. 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  He  has a past medical history of Atrial fibrillation (CMS-HCC), Basal cell carcinoma, Cancer (CMS-HCC), Cirrhosis (CMS-HCC), Depression, Diabetes (CMS-HCC), Headache, Hepatitis C (07/17/2012), History of transfusion, Liver disease, Low back pain (07/17/2012), and Varices, esophageal (CMS-HCC).    Findings: dypsphonia  right vocal fold immobility   Glottic insufficiency   --all temporally related to liver transplant transplant. (prolonged intubation, trach possible etiologies)   05/31/17 Status post in office injection augmentation with Cymetra in the [facility]   Significantly complicated history surrounding injection including hemothorax and anemia prior to injection followed by readmission x2 for fall rib pain and intubation for ERCP.  S/p [facility] on 09/13/17 for in office [facility] injection TTH cymetra 0.23ml.   Small Zenker diverticulum  S/p endoscopic co2 laser zenker diverticulotomy and botox injection CP 04/04/18     Plan:  Doing well postop. No complications. Repeat [facility]. Same day fu.   Will plan for voice surgery in future.

## 2018-05-15 ENCOUNTER — Ambulatory Visit
Admit: 2018-05-15 | Discharge: 2018-05-16 | Payer: MEDICARE | Attending: Speech-Language Pathologist | Primary: Speech-Language Pathologist

## 2018-05-15 ENCOUNTER — Ambulatory Visit: Admit: 2018-05-15 | Discharge: 2018-05-16 | Payer: MEDICARE

## 2018-05-15 DIAGNOSIS — R1311 Dysphagia, oral phase: Principal | ICD-10-CM

## 2018-05-15 DIAGNOSIS — K225 Diverticulum of esophagus, acquired: Principal | ICD-10-CM

## 2018-05-15 DIAGNOSIS — R1313 Dysphagia, pharyngeal phase: Principal | ICD-10-CM

## 2018-05-15 NOTE — Unmapped (Addendum)
[facility] ADULT SPEECH THERAPY [place]  OUTPATIENT SPEECH PATHOLOGY  05/15/2018  Note Type: Evaluation          Patient Name: Jeffrey Ward  Date of Birth:December 25, 1950     Diagnosis: Dysphagia        Date of Evaluation: 05/15/18     Referred by: Martina Sinner, MD  Reason for Referral: MBSS                 Assessment: Pt presents with a mild pharyngeal dysphagia c/b reduced BOT retraction and decreased pharyngeal constriction. These led to vallecula residue (mild - thin liquid/puree; moderate - solid) and moderate solid residue along the posterior pharyngeal wall into the post-cricoid area. No aspiration or penetration observed. Cervical esophagus: mild accumulation of boluses at tissue protrusion (~C6) where there was a previously identified small Zenker's diverticulum for which pt is s/p endoscopic co2 laser Zenker diverticulotomy and CP botox injection. Minimal accumulation with thin liquids/puree and moderate amount with solids. Mild backflow of solids. Pt required multiple swallows and liquid rinse to adequately reduce and assist in clearing solid residue.      PO Recommendations : Regular Solids (no restrictions), Thin liquids    Recommended Compensatory Techniques : Small sips/bites, Alternate liquids and solids, Upright 90 degrees, Slow rate(Moisten solids PRN)       SUBJECTIVE:  68 y.o. 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 He is s/p endoscopic co2 laser zenker diverticulotomy and CP botox injection 04/04/18.  He is on a regular diet with thin liquids.     Communication Preference: Verbal    Barriers to Learning: No Barriers                        Pain?: No        Prior Function: Independent      Past Medical History:   Diagnosis Date   ??? Atrial fibrillation (CMS-HCC)    ??? Basal cell carcinoma    ??? Cancer (CMS-HCC)    ??? Cirrhosis (CMS-HCC)    ??? Depression    ??? Diabetes (CMS-HCC)    ??? Headache    ??? Hepatitis C 07/17/2012   ??? History of transfusion    ??? Liver disease    ??? Low back pain 07/17/2012   ??? Varices, esophageal (CMS-HCC)       Family History   Problem Relation Age of Onset   ??? Hypertension Mother    ??? Cancer Mother    ??? Heart disease Mother    ??? Heart disease Father    ??? Cirrhosis Neg Hx    ??? Liver cancer Neg Hx    ??? Anemia Neg Hx    ??? Diabetes Neg Hx    ??? Kidney disease Neg Hx    ??? Obesity Neg Hx    ??? Thyroid disease Neg Hx    ??? Osteoporosis Neg Hx    ??? Coronary artery disease Neg Hx    ??? Anesthesia problems Neg Hx    ??? Basal cell carcinoma Neg Hx    ??? Squamous cell carcinoma Neg Hx      Past Surgical History:   Procedure Laterality Date   ??? ANKLE SURGERY     ??? BACK SURGERY     ??? BACK SURGERY     ??? CARDIAC SURGERY      pacemaker   ???  CHG US GUIDE, TISSUE ABLATION N/A 01/22/2016    Procedure: ULTRASOUND GUIDANCE FOR, AND MONITORING OF, PARENCHYMAL TISSUE ABLATION;  Surgeon: Particia Nearing, MD;  Location: MAIN OR [facility];  Service: Transplant   ??? IR EMBOLIZATION ORGAN ISCHEMIA, TUMORS, INFAR  06/16/2016    IR EMBOLIZATION ORGAN ISCHEMIA, TUMORS, INFAR 06/16/2016 Ammie Dalton, MD IMG VIR H&V [facility]   ??? PR COLON CA SCRN NOT HI RSK IND N/A 02/27/2015    Procedure: COLOREC CNCR SCR;COLNSCPY NO;  Surgeon: Vonda Antigua, MD;  Location: GI PROCEDURES [facility]OR[facility];  Service: Gastroenterology   ??? PR ENDOSCOPIC ULTRASOUND EXAM N/A 02/27/2015    Procedure: UGI ENDO; W/ENDO ULTRASOUND EXAM INCLUDES ESOPHAGUS, STOMACH, &/OR DUODENUM/JEJUNUM;  Surgeon: Vonda Antigua, MD;  Location: GI PROCEDURES MEMORIAL [facility];  Service: Gastroenterology   ??? PR ERCP BALLOON DILATE BILIARY/PANC DUCT/AMPULLA EA N/A 02/10/2017    Procedure: ERCP;WITH TRANS-ENDOSCOPIC BALLOON DILATION OF BILIARY/PANCREATIC DUCT(S) OR OF AMPULLA, INCLUDING SPHINCTERECTOMY, WHEN PERFOREMD,EACH DUCT (16109);  Surgeon: Mayford Knife, MD;  Location: GI PROCEDURES [facility]OR[facility];  Service: Gastroenterology   ??? PR ERCP REMOVE FOREIGN BODY/STENT BILIARY/PANC DUCT N/A 02/10/2017    Procedure: ENDOSCOPIC RETROGRADE CHOLANGIOPANCREATOGRAPHY (ERCP); W/ REMOVAL OF FOREIGN BODY/STENT FROM BILIARY/PANCREATIC DUCT(S);  Surgeon: Mayford Knife, MD;  Location: GI PROCEDURES [facility];  Service: Gastroenterology   ??? PR ERCP REMOVE FOREIGN BODY/STENT BILIARY/PANC DUCT N/A 06/29/2017    Procedure: ENDOSCOPIC RETROGRADE CHOLANGIOPANCREATOGRAPHY (ERCP); W/ REMOVAL OF FOREIGN BODY/STENT FROM BILIARY/PANCREATIC DUCT(S);  Surgeon: Vonda Antigua, MD;  Location: GI PROCEDURES [facility]ORIAL [facility];  Service: Gastroenterology   ??? PR ERCP STENT PLACEMENT BILIARY/PANCREATIC DUCT N/A 11/29/2016    Procedure: ENDOSCOPIC RETROGRADE CHOLANGIOPANCREATOGRAPHY (ERCP); WITH PLACEMENT OF ENDOSCOPIC STENT INTO BILIARY OR PANCREATIC DUCT;  Surgeon: Chriss Driver, MD;  Location: GI PROCEDURES [facility]OR[facility];  Service: Gastroenterology   ??? PR ERCP STENT PLACEMENT BILIARY/PANCREATIC DUCT N/A 04/26/2017    Procedure: ENDOSCOPIC RETROGRADE CHOLANGIOPANCREATOGRAPHY (ERCP); WITH PLACEMENT OF ENDOSCOPIC STENT INTO BILIARY OR PANCREATIC DUCT;  Surgeon: Mayford Knife, MD;  Location: GI PROCEDURES MEMORIAL [facility];  Service: Gastroenterology   ??? PR ERCP,W/REMOVAL STONE,BIL/PANCR DUCTS N/A 11/29/2016    Procedure: ERCP; W/ENDOSCOPIC RETROGRADE REMOVAL OF CALCULUS/CALCULI FROM BILIARY &/OR PANCREATIC DUCTS;  Surgeon: Chriss Driver, MD;  Location: GI PROCEDURES MEMORIAL [facility];  Service: Gastroenterology   ??? PR ERCP,W/REMOVAL STONE,BIL/PANCR DUCTS N/A 02/10/2017    Procedure: ERCP; W/ENDOSCOPIC RETROGRADE REMOVAL OF CALCULUS/CALCULI FROM BILIARY &/OR PANCREATIC DUCTS;  Surgeon: Mayford Knife, MD;  Location: GI PROCEDURES MEMOR[facility];  Service: Gastroenterology   ??? PR ERCP,W/REMOVAL STONE,BIL/PANCR DUCTS N/A 04/26/2017    Procedure: ERCP; W/ENDOSCOPIC RETROGRADE REMOVAL OF CALCULUS/CALCULI FROM BILIARY &/OR PANCREATIC DUCTS;  Surgeon: Mayford Knife, MD;  Location: GI PROCEDURES MEMORIAL [facility];  Service: Gastroenterology   ??? PR ERCP,W/REMOVAL STONE,BIL/PANCR DUCTS N/A 06/29/2017    Procedure: ERCP; W/ENDOSCOPIC RETROGRADE REMOVAL OF CALCULUS/CALCULI FROM BILIARY &/OR PANCREATIC DUCTS;  Surgeon: Vonda Antigua, MD;  Location: GI PROCEDURES [facility];  Service: Gastroenterology   ??? PR ESOPHAGOSCP RIG TRANSORAL HYPOPHARYNX CRV ESOPH N/A 04/04/2018    Procedure: ESOPHAGOSCOPY, RIGID, TRANSORAL WITH DIVERTICULECTOMY OF HYPOPHARYNX OR CERVICAL ESOPHAGUS, WITH CRICOPHARYNGEAL MYOTOMY, INCLUDES USE OF TELESCOPE OR OPERATING MICROSCOPE AND REPAIR, WHEN PERFORMED;  Surgeon: Vista Deck, MD;  Location: MAIN OR [facility];  Service: ENT   ??? PR INSER HEART TEMP PACER ONE CHMBR N/A 10/02/2016    Procedure: Tempoarary Pacemaker Insertion;  Surgeon: Meredith Leeds, MD;  Location: [facility];  Service: Cardiology   ???  PR LAP,DIAGNOSTIC ABDOMEN N/A 01/22/2016    Procedure: Laparoscopy, Abdomen, Peritoneum, & Omentum, Diagnostic, W/Wo Collection Specimen(S) By Brushing Or Washing;  Surgeon: Particia Nearing, MD;  Location: MAIN OR [facility]  Service: Transplant   ??? PR PLACE PERCUT GASTROSTOMY TUBE N/A 11/17/2016    Procedure: UGI ENDO; W/DIRECTED PLCMT PERQ GASTROSTOMY TUBE;  Surgeon: Cletis Athens, MD;  Location: GI PROCEDURES MEMORIAL [facility];  Service: Gastroenterology   ??? PR TRACHEOSTOMY, PLANNED N/A 09/29/2016    Procedure: TRACHEOSTOMY PLANNED (SEPART PROC);  Surgeon: Katherina Mires, MD;  Location: MAIN OR [facility];  Service: Trauma   ??? PR TRANSCATH INSERT OR REPLACE LEADLESS PM VENTR N/A 10/11/2016    Procedure: Pacemaker Implant/Replace Leadless;  Surgeon: Meredith Leeds, MD;  Location: [facility];  Service: Cardiology   ??? PR TRANSPLANT LIVER,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    Procedure: Liver Allotransplantation; Orthotopic, Partial Or Whole, From Cadaver Or Living Donor, Any Age;  Surgeon: Doyce Loose, MD;  Location: MAIN OR [facility];  Service: Transplant   ??? PR TRANSPLANT,PREP DONOR LIVER, WHOLE N/A 09/15/2016    Procedure: Rogelia Boga Std Prep Cad Donor Whole Liver Gft Prior Tnsplnt,Inc Chole,Diss/Rem Surr Tissu Wo Triseg/Lobe Splt;  Surgeon: Doyce Loose, MD;  Location: MAIN OR [facility];  Service: Transplant   ??? PR UPPER GI ENDOSCOPY,BIOPSY N/A 07/17/2012    Procedure: UGI ENDOSCOPY; WITH BIOPSY, SINGLE OR MULTIPLE;  Surgeon: Alba Destine, MD;  Location: GI PROCEDURES MEMOR[facility];  Service: Gastroenterology   ??? PR UPPER GI ENDOSCOPY,DIAGNOSIS N/A 02/04/2014    Procedure: UGI ENDO, INCLUDE ESOPHAGUS, STOMACH, & DUODENUM &/OR JEJUNUM; DX W/WO COLLECTION SPECIMN, BY BRUSH OR WASH;  Surgeon: Wilburt Finlay, MD;  Location: GI PROCEDURES [facility]ORIAL [facility];  Service: Gastroenterology   ??? PR UPPER GI ENDOSCOPY,LIGAT VARIX N/A 11/05/2013    Procedure: UGI ENDO; W/BAND LIG ESOPH &/OR GASTRIC VARICES;  Surgeon: Wilburt Finlay, MD;  Location: GI PROCEDURES [facility]OR[facility];  Service: Gastroenterology    No Known Allergies  Social History     Tobacco Use   ??? Smoking status: Never Smoker   ??? Smokeless tobacco: Never Used   ??? Tobacco comment: Smoked in high school for about 5 years.    Substance Use Topics   ??? Alcohol use: No      Current Outpatient Medications   Medication Sig Dispense Refill   ??? alendronate (FOSAMAX) 70 MG tablet Take 1 tablet (70 mg total) by mouth every seven (7) days. 4 tablet 11   ??? bisacodyl (DULCOLAX, BISACODYL,) 5 mg EC tablet Take 5 mg by mouth daily as needed.      ??? blood sugar diagnostic Strp Use three (3) times a day before meals. 100 each 7   ??? blood-glucose meter (GLUCOSE MONITORING KIT) kit Use as instructed 1 each 0   ??? calcium carbonate (OS-CAL) 500 mg calcium (1,250 mg) chewable tablet Chew 2 tablets Two (2) times a day.      ??? docusate sodium (COLACE) 100 MG capsule TAKE 1 CAPSULE BY MOUTH TWICE DAILY (Patient not taking: Reported on 05/07/2018) 60 capsule 0   ??? ferrous sulfate 325 (65 FE) MG tablet Take 325 mg by mouth.     ??? insulin glargine (BASAGLAR, LANTUS) 100 unit/mL (3 mL) injection pen INJECT 12 UNITS SUBCUTANEOUSLY (UNDER SKIN) NIGHTLY 15 mL 10   ??? lancets 30 gauge Misc USE AS DIRECTED TO CHECK BLOOD SUGAR 4 TIMES DAILY ( BEFORE MEALS AND NIGHTLY ) 100 each 11   ??? magnesium oxide-Mg  AA chelate (MAGNESIUM, AMINO ACID CHELATE,) 133 mg Tab Take 2 tablets by mouth Two (2) times a day. 100 tablet PRN   ??? melatonin 3 mg Tab Take 1 tablet (3 mg total) by mouth nightly. 30 tablet 0   ??? mirtazapine (REMERON) 15 MG tablet TAKE 1 TABLET BY MOUTH NIGHTLY 90 tablet 3   ??? mycophenolate (CELLCEPT) 250 mg capsule TAKE 2 CAPSULES (500MG ) BY MOUTH TWICE DAILY 120 capsule 7   ??? NEORAL 100 mg capsule Take 2 capsules (200 mg) in the morning and 1 capsule with three 25mg  capsules for a total daily dose of 200mg  each morning and 175mg  every evening 90 each 11   ??? NEORAL 25 mg capsule Take 3 capsules (75mg ) along with a 100mg  capsule every evening for a total dose of 175mg  every evening (Take two 100mg  capsules in the morning) 90 each 11   ??? nitrofurantoin, macrocrystal-monohydrate, (MACROBID) 100 MG capsule Take 1 capsule (100 mg total) by mouth twice daily until gone. (Patient not taking: Reported on 05/07/2018) 13 capsule 0   ??? pen needle, diabetic 32 gauge x 5/32 Ndle USE WITH INSULIN PEN 4 TIMES DAILY AS DIRECTED 100 each 1     No current facility-administered medications for this encounter.          Objective Swallow  Cognitive-Communication Status : WFL, severe dysphonia  Positioning : Upright in chair    Presentation Methods Used During Study  Bolus Presentation : Cup Sip, Spoon, Fed self    Thin Liquids   Oral Stage: WFL  Swallow Initiation : WFL  Nasopharyngeal Reflux : None noted  Pharyngeal Stage : Tongue Base Retraction mildly impaired, Residue after swallow  Penetration Aspiration Score: 1 - Material does not enter airway  Aspiration Volume : None  Esophageal Phase Screening : (see assessment)              Puree  Oral Stage : WFL  Swallow Initiation : WFL Nasopharyngeal Reflux: None noted  Pharyngeal Stage : Tongue Base Retraction mildly impaired, Residue after swallow  Penetration Aspiration Score: 1 - Material does not enter airway  Aspiration Volume : None  Esophageal Phase Screening : (see assessment)    Solids   Oral Stage : WFL  Swallow Initiation : WFL  Nasopharyngeal Reflux: None noted  Pharyngeal Stage : Tongue Base Retraction mildly impaired, Residue after swallow, Tongue Base Retraction moderately impaired, Pharyngeal Constriction mildly impaired  Penetration Aspiration Score: 1 - Material does not enter airway  Aspiration Volume : None  Esophageal Phase Screening : Mild backflow noted(see assessment)              Education Provided: Patient; Discussed results and recommendations; pharyngeal swallow exercises   Response to Education: Understanding verbalized          Session Duration : 25    Today's Charges (noted here with $$):     SLP Evaluations  $$ Motion Fluoro Swallow Eval- Modified [mins]: 25              I attest that I have reviewed the above information.  Signed: Reuel Boom, CCC-SLP  05/15/2018 2:30 PM

## 2018-05-16 NOTE — Unmapped (Signed)
Encounter addended by: Reuel Boom, CCC-SLP on: 05/16/2018 10:15 AM   Actions taken: Clinical Note Signed

## 2018-05-18 NOTE — Unmapped (Signed)
I called pt. to remind him of the appt. on 05/23/18 at 1400. LM for pt.I asked pt. to arrive at 1330. Pt. reminded that no driver is required and that it is OK to eat before the appt. Pt. advised that if there are s/s of illness or if on antibiotics for any reason, the appt. will need to be rescheduled. the guy who answered the phone offered to take a msg. States that pt. hasn't been sick or taking antibiotics.

## 2018-05-23 ENCOUNTER — Ambulatory Visit: Admit: 2018-05-23 | Discharge: 2018-05-23 | Payer: MEDICARE

## 2018-05-23 ENCOUNTER — Ambulatory Visit
Admit: 2018-05-23 | Discharge: 2018-05-23 | Payer: MEDICARE | Attending: Physical Medicine & Rehabilitation | Primary: Physical Medicine & Rehabilitation

## 2018-05-23 DIAGNOSIS — M533 Sacrococcygeal disorders, not elsewhere classified: Principal | ICD-10-CM

## 2018-05-23 DIAGNOSIS — Z981 Arthrodesis status: Principal | ICD-10-CM

## 2018-05-23 DIAGNOSIS — M479 Spondylosis, unspecified: Principal | ICD-10-CM

## 2018-05-23 NOTE — Unmapped (Signed)
Procedure complete. Fluoro time = 5 sec. Bandage applied by MD.

## 2018-05-23 NOTE — Unmapped (Signed)
PROCEDURE: Right sacroiliac (SI) joint injection  INDICATION: sacroiliac joint dysfunction  PRE-PROCEDURE CHECKLIST:  - Conservative Measures Tried: physical therapy, medications (NSAIDS, muscle relaxants, or neuropathic pain agents)  - Allergies: no allergy to iodine, seafood, shellfish, anesthetics, steroid  - Infection Status: no active infection and not on antibiotics within the last 2 weeks  - Anticoagulation: none or if on anticoagulation, held for appropriate amount of time with primary prescriber's consent and directions for stop and start protocol provided to patient  - Pregnancy Status: not pregnant or not applicable (i.e. male)  - Risks Discussed:  minor or major medication reaction, bleeding, infection, death, paralysis, nerve injury, increased pain, no change in pain, other unforseen complications  - Benefits Discussed: potential pain reduction and improvement in function  - Alternatives Discussed: not doing the procedure, continuing with conservative measures and/or medical management   TECHNIQUE: After confirming written and informed consent discussing risks of bleeding, infection, nerve injury, medication reaction, and alternative of not doing the procedure, the patient was brought to the procedure room and placed in the prone position. BP, HR, O2SAT, visual/verbal monitoring was established. A TIME OUT was performed. The patient did not require sedation for the procedure. Using universal sterile precautions, procedure equipment and medications were prepared in sterile fashion and skin was prepped with chlorhexidine then draped. Fluoroscopy was utilized to delineate the anatomy of the sacrumand thereafter used to guide needle placement. The Right SI joint was identified in AP view. The fluoroscope was then positioned to provide a contralateral oblique view of the joint. Skin was anesthetized using 3ml of 1% lidocaine. A 22 gauge 3.5 inch needle was directed toward the target using intermittent fluoroscopic guidance. Final position was confirmed with multiplanar AP and lateral views. A small amount of contrast dye was injected with appropriate dye pattern and no vascular uptake. The SI joint was then injected with 40 mg Kenalog and 1.5 cc of 1% lidocaine for a total injectate volume of 2.5 cc. Negative aspiration was noted prior to injection. No paresthesias were reported with needle placement. Vital signs remained stable throughout the procedure. The needle(s) were removed intact and a bandage was applied. The patient was transferred to the recovery area. The patient was monitored, reassessed and discharged after an appropriate observatory period.  COMPLICATIONS: The patient tolerated the procedure well and there were no complications.  PRE-PROCEDURE PAIN SCORE: 6  POST-PROCEDURE PAIN SCORE: 1  POST-PROCEDURE EXAM: Marked improvement in pain.  No new neurologic compromise; patient discharged with normal baseline neurologic function.

## 2018-05-28 NOTE — Unmapped (Signed)
Confirmed with patient wife he is to get labs drawn per usual sometime next week.

## 2018-05-30 MED FILL — MG-PLUS-PROTEIN 133 MG TABLET: 25 days supply | Qty: 100 | Fill #2 | Status: AC

## 2018-05-30 MED FILL — MG-PLUS-PROTEIN 133 MG TABLET: ORAL | 25 days supply | Qty: 100 | Fill #2

## 2018-06-01 NOTE — Unmapped (Signed)
[facility] Refill Coordination Note  Medication: Mycophenolate 250mg  & Neoral 100mg  & 25mg     Unable to reach patient to schedule shipment for medication being filled at [facility]. Left voicemail on phone.  As this is the 3rd unsuccessful attempt to reach the patient, no additional phone call attempts will be made at this time.      Phone numbers attempted: [phone] (H)    Last scheduled delivery: 05/08/2018    Please call the [facility] at [phone] (option 4) should you have any further questions.      Thanks,  [facility] Specialty Team

## 2018-06-12 DIAGNOSIS — D899 Disorder involving the immune mechanism, unspecified: Principal | ICD-10-CM

## 2018-06-12 DIAGNOSIS — Z944 Liver transplant status: Principal | ICD-10-CM

## 2018-06-12 MED ORDER — SILDENAFIL (PULMONARY HYPERTENSION) 20 MG TABLET
ORAL_TABLET | 6 refills | 0 days | Status: CP
Start: 2018-06-12 — End: 2019-06-12
  Filled 2018-06-13: qty 180, 30d supply, fill #0

## 2018-06-12 NOTE — Unmapped (Signed)
[facility] Pharm[facility] Refill Coordination Note    Specialty Medication(s) to be Shipped:   Transplant: mycophenolate mofetil 250mg , Neoral 25mg  and Neoral 100mg   Other medication(s) to be shipped: Alendronate 70mg  & Sildenafil 20mg      **Due to nationwide backorder of brand Neoral 100mg , patient will be switched to generic Cyclosporine 25mg  & 100mg . Patient and clinic are aware of the switch. Patient also aware of need for followup lab work. I will message coordinator today of approximate date of switch so appropriate labwork can be ordered. Jeffrey Ward, DOB: 03-12-1951  Phone: [phone] (home)     All above HIPAA information was verified with patient's caregiver.  Wife, [name]    Completed refill call assessment today to schedule patient's medication shipment from the [facility] [phone]).       Specialty medication(s) and dose(s) confirmed: Regimen is correct and unchanged.   Changes to medications: Terrian reports no changes reported at this time.  Changes to insurance: No  Questions for the pharmacist: No    Confirmed patient received Welcome Packet with first shipment. The patient will receive a drug information handout for each medication shipped and additional FDA Medication Guides as required.       DISEASE/MEDICATION-SPECIFIC INFORMATION        N/A    SPECIALTY MEDICATION ADHERENCE     Medication Adherence    Patient reported X missed doses in the last month:  0  Specialty Medication:  Mycophenolate 250mg   Patient is on additional specialty medications:  Yes  Additional Specialty Medications:  Neoral 100mg   Patient Reported Additional Medication X Missed Doses in the Last Month:  0  Patient is on more than two specialty medications:  Yes  Specialty Medication:  Neoral 25mg   Patient Reported Additional Medication X Missed Doses in the Last Month:  0  Support network for adherence:  family member       Mycophenolate 250 mg: 5 days of medicine on hand   Neoral 25 mg: 5 days of medicine on hand   Neoral 100 mg: 5 days of medicine on hand     SHIPPING     Shipping address confirmed in Epic.     Delivery Scheduled: Yes, Expected medication delivery date: 06/14/2018.     Medication will be delivered via UPS to the home address in Epic [place].    Luwanda Starr P [name]   [facility] Shared [facility] Specialty Technician

## 2018-06-13 MED ORDER — CYCLOSPORINE MODIFIED 100 MG CAPSULE
11 refills | 0 days | Status: CP
Start: 2018-06-13 — End: ?
  Filled 2018-06-13: qty 90, 30d supply, fill #0

## 2018-06-13 MED ORDER — CYCLOSPORINE MODIFIED 25 MG CAPSULE
11 refills | 0 days | Status: CP
Start: 2018-06-13 — End: ?
  Filled 2018-06-13: qty 90, 30d supply, fill #0

## 2018-06-13 MED FILL — SILDENAFIL (PULMONARY HYPERTENSION) 20 MG TABLET: 30 days supply | Qty: 180 | Fill #0 | Status: AC

## 2018-06-13 MED FILL — MYCOPHENOLATE MOFETIL 250 MG CAPSULE: 30 days supply | Qty: 120 | Fill #2

## 2018-06-13 MED FILL — MYCOPHENOLATE MOFETIL 250 MG CAPSULE: 30 days supply | Qty: 120 | Fill #2 | Status: AC

## 2018-06-13 MED FILL — ALENDRONATE 70 MG TABLET: ORAL | 28 days supply | Qty: 4 | Fill #3

## 2018-06-13 MED FILL — ALENDRONATE 70 MG TABLET: 28 days supply | Qty: 4 | Fill #3 | Status: AC

## 2018-06-13 MED FILL — CYCLOSPORINE MODIFIED 25 MG CAPSULE: 30 days supply | Qty: 90 | Fill #0 | Status: AC

## 2018-06-13 MED FILL — CYCLOSPORINE MODIFIED 100 MG CAPSULE: 30 days supply | Qty: 90 | Fill #0 | Status: AC

## 2018-06-27 MED ORDER — LANTUS SOLOSTAR U-100 INSULIN 100 UNIT/ML (3 ML) SUBCUTANEOUS PEN
Freq: Every evening | SUBCUTANEOUS | 3 refills | 0.00000 days
Start: 2018-06-27 — End: 2018-07-16

## 2018-06-27 MED ORDER — INSULIN ASPART (U-100) 100 UNIT/ML (3 ML) SUBCUTANEOUS PEN
Freq: Three times a day (TID) | SUBCUTANEOUS | 3 refills | 0.00000 days
Start: 2018-06-27 — End: 2018-07-16

## 2018-06-27 MED ORDER — PREGABALIN 75 MG CAPSULE
ORAL_CAPSULE | 6 refills | 0 days
Start: 2018-06-27 — End: ?

## 2018-06-28 MED FILL — PREGABALIN 75 MG CAPSULE: 30 days supply | Qty: 60 | Fill #0 | Status: AC

## 2018-06-28 MED FILL — PREGABALIN 75 MG CAPSULE: 30 days supply | Qty: 60 | Fill #0

## 2018-07-02 NOTE — Unmapped (Signed)
[facility] Refill Coordination Note    Specialty Medication(s) to be Shipped:   Transplant: mycophenolate mofetil 250mg , cyclosporine 100mg  and cyclosporine 25mg     Other medication(s) to be shipped: alendronate 70mg , sildenafil 20mg      Jeffrey Ward, DOB: Jun 28, 1950  Phone: [phone] (home)       All above HIPAA information was verified with patient.     Completed refill call assessment today to schedule patient's medication shipment from the [facility] [phone]).       Specialty medication(s) and dose(s) confirmed: Regimen is correct and unchanged.   Changes to medications: Jeffrey Ward reports no changes at this time.  Changes to insurance: No  Questions for the pharmacist: No    Confirmed patient received Welcome Packet with first shipment. The patient will receive a drug information handout for each medication shipped and additional FDA Medication Guides as required.       DISEASE/MEDICATION-SPECIFIC INFORMATION        N/A    SPECIALTY MEDICATION ADHERENCE     Medication Adherence    Patient reported X missed doses in the last month:  0  Specialty Medication:  Cyclosporine 100mg   Patient is on additional specialty medications:  Yes  Additional Specialty Medications:  Cyclosporine 25mg   Patient Reported Additional Medication X Missed Doses in the Last Month:  0  Patient is on more than two specialty medications:  Yes  Specialty Medication:  Mycophenolate 250mg   Patient Reported Additional Medication X Missed Doses in the Last Month:  0  Support network for adherence:  family member                Cyclosporine 100 mg: 14 days of medicine on hand   Cyclosporine 25 mg: 14 days of medicine on hand   Mycophenolate 250 mg: 14 days of medicine on hand         SHIPPING     Shipping address confirmed in Epic.     Delivery Scheduled: Yes, Expected medication delivery date: 07/10/18.     Medication will be delivered via UPS to the home address in Epic [place].    Jasper Loser   [facility] Specialty Technician

## 2018-07-03 MED FILL — MG-PLUS-PROTEIN 133 MG TABLET: 25 days supply | Qty: 100 | Fill #3 | Status: AC

## 2018-07-03 MED FILL — MG-PLUS-PROTEIN 133 MG TABLET: ORAL | 25 days supply | Qty: 100 | Fill #3

## 2018-07-09 MED FILL — CYCLOSPORINE MODIFIED 25 MG CAPSULE: 30 days supply | Qty: 90 | Fill #1 | Status: AC

## 2018-07-09 MED FILL — ALENDRONATE 70 MG TABLET: ORAL | 28 days supply | Qty: 4 | Fill #4

## 2018-07-09 MED FILL — CYCLOSPORINE MODIFIED 100 MG CAPSULE: 30 days supply | Qty: 90 | Fill #1

## 2018-07-09 MED FILL — MYCOPHENOLATE MOFETIL 250 MG CAPSULE: 30 days supply | Qty: 120 | Fill #3

## 2018-07-09 MED FILL — SILDENAFIL (PULMONARY HYPERTENSION) 20 MG TABLET: 30 days supply | Qty: 180 | Fill #1

## 2018-07-09 MED FILL — SILDENAFIL (PULMONARY HYPERTENSION) 20 MG TABLET: 30 days supply | Qty: 180 | Fill #1 | Status: AC

## 2018-07-09 MED FILL — MYCOPHENOLATE MOFETIL 250 MG CAPSULE: 30 days supply | Qty: 120 | Fill #3 | Status: AC

## 2018-07-09 MED FILL — CYCLOSPORINE MODIFIED 25 MG CAPSULE: 30 days supply | Qty: 90 | Fill #1

## 2018-07-09 MED FILL — ALENDRONATE 70 MG TABLET: 28 days supply | Qty: 4 | Fill #4 | Status: AC

## 2018-07-09 MED FILL — CYCLOSPORINE MODIFIED 100 MG CAPSULE: 30 days supply | Qty: 90 | Fill #1 | Status: AC

## 2018-07-16 ENCOUNTER — Institutional Professional Consult (permissible substitution): Admit: 2018-07-16 | Discharge: 2018-07-17 | Payer: MEDICARE

## 2018-07-16 MED ORDER — BLOOD SUGAR DIAGNOSTIC STRIPS
Freq: Three times a day (TID) | 7 refills | 0 days | Status: CP
Start: 2018-07-16 — End: 2019-07-16

## 2018-07-16 MED ORDER — LANTUS SOLOSTAR U-100 INSULIN 100 UNIT/ML (3 ML) SUBCUTANEOUS PEN
Freq: Every evening | SUBCUTANEOUS | 0 refills | 0.00000 days | Status: CP
Start: 2018-07-16 — End: 2018-07-25

## 2018-07-16 MED ORDER — INSULIN ASPART (U-100) 100 UNIT/ML (3 ML) SUBCUTANEOUS PEN
Freq: Three times a day (TID) | SUBCUTANEOUS | 0 refills | 0 days | Status: CP
Start: 2018-07-16 — End: 2018-09-27

## 2018-07-16 NOTE — Unmapped (Signed)
It was nice to speak with you today.    Please continue taking your insulin with no changes to the dose today.    Follow up with me if needed. Otherwise, you can follow up with your primary care provider. Follow up with me if you and your doctor need additional help with thyroid or diabetes. If A1c is > 8 %, you are having hypoglycemia frequently or you have new abnormalities in your thyroid tests. I recommend you have thyroid test once every 6 months.    Please let me know if there are any questions or concerns.    Sincerely,  Alben Spittle, MD

## 2018-07-16 NOTE — Unmapped (Signed)
ENDOCRINE/DIABETES PATIENT VISIT BY PHONE/VIDEOCALL (TELEVISIT PHONE)    I spent 21 minutes on the phone with the patient. I spent an additional 5 minutes on pre- and post-visit activities.     The patient was physically located in [place] or a state in which I am permitted to provide care. The patient and/or parent/gauardian understood that s/he may incur co-pays and cost sharing, and agreed to the telemedicine visit. The visit was completed via phone and/or video, which was appropriate and reasonable under the circumstances given the patient's presentation at the time.    The patient and/or parent/guardian has been advised of the potential risks and limitations of this mode of treatment (including, but not limited to, the absence of in-person examination) and has agreed to be treated using telemedicine. The patient's/patient's family's questions regarding telemedicine have been answered.     If the phone/video visit was completed in an ambulatory setting, the patient and/or parent/guardian has also been advised to contact their provider???s office for worsening conditions, and seek emergency medical treatment and/or call 911 if the patient deems either necessary.        Consent was obtained for this visit.    Total time: 26 minutes     Subjective:     History of Present Illness:  68 y.o. male called for follow up care for abnormal thyroid function.    Interim history notable for doing well with no complaints. He occasionally has mild hypoglycemia symptoms that resolve with orange juice. This happens less than 1 time per week. He denies fatigue, constipation, diarrhea, tremor, palpitations, temperature intolerance, unexpected weight change or any other symptoms. He is taking amiodarone.        Relevant medications and doses, for conditions being followed by me:  Lantus (Glargine) 12 units daily.  Novolog (Aspart) 4 units with meals.    Past Medical History:  Past Medical History:   Diagnosis Date   ??? Atrial fibrillation (CMS-HCC)    ??? Basal cell carcinoma    ??? Cancer (CMS-HCC)    ??? Cirrhosis (CMS-HCC)    ??? Depression    ??? Diabetes (CMS-HCC)    ??? Headache    ??? Hepatitis C 07/17/2012   ??? History of transfusion    ??? Liver disease    ??? Low back pain 07/17/2012   ??? Varices, esophageal (CMS-HCC)        Current Outpatient Medications   Medication Sig Dispense Refill   ??? alendronate (FOSAMAX) 70 MG tablet Take 1 tablet (70 mg total) by mouth every seven (7) days. 4 tablet 11   ??? bisacodyl (DULCOLAX, BISACODYL,) 5 mg EC tablet Take 5 mg by mouth daily as needed.      ??? blood sugar diagnostic Strp Use three (3) times a day before meals. 100 each 7   ??? blood-glucose meter (GLUCOSE MONITORING KIT) kit Use as instructed 1 each 0   ??? calcium carbonate (OS-CAL) 500 mg calcium (1,250 mg) chewable tablet Chew 2 tablets Two (2) times a day.      ??? cycloSPORINE modified (NEORAL) 100 MG capsule Take 2 capsules (200 mg) by mouth in the morning and 1 capsule in the evening with three 25mg  capsules (For a total daily dose of 200mg  each morning and 175mg  every evening) 90 each 11   ??? cycloSPORINE modified (NEORAL) 25 MG capsule Take 3 capsules (75mg ) by mouth every evening with a 100mg  capsule every evening (For a total dose of 175mg  every evening) 90 each 11   ???  ferrous sulfate 325 (65 FE) MG tablet Take 325 mg by mouth.     ??? insulin ASPART (NOVOLOG FLEXPEN U-100 INSULIN) 100 unit/mL (3 mL) injection pen Inject 0.04 mL (4 Units total) under the skin Three (3) times a day with meals. Add extra units if sugar is high, max daily dose 30 units. 15 mL 3   ??? insulin glargine (BASAGLAR, LANTUS) 100 unit/mL (3 mL) injection pen INJECT 12 UNITS SUBCUTANEOUSLY (UNDER SKIN) NIGHTLY 15 mL 10   ??? insulin glargine (LANTUS SOLOSTAR U-100 INSULIN) 100 unit/mL (3 mL) injection pen Inject 0.12 mL (12 Units total) under the skin nightly. 15 mL 3   ??? magnesium oxide-Mg AA chelate (MAGNESIUM, AMINO ACID CHELATE,) 133 mg Tab Take 2 tablets by mouth Two (2) times a day. 100 tablet PRN   ??? melatonin 3 mg Tab Take 1 tablet (3 mg total) by mouth nightly. 30 tablet 0   ??? mirtazapine (REMERON) 15 MG tablet TAKE 1 TABLET BY MOUTH NIGHTLY 90 tablet 3   ??? mycophenolate (CELLCEPT) 250 mg capsule TAKE 2 CAPSULES (500MG ) BY MOUTH TWICE DAILY 120 capsule 7   ??? nitrofurantoin, macrocrystal-monohydrate, (MACROBID) 100 MG capsule Take 1 capsule (100 mg total) by mouth twice daily until gone. (Patient not taking: Reported on 05/07/2018) 13 capsule 0   ??? pen needle, diabetic 32 gauge x 5/32 Ndle USE WITH INSULIN PEN 4 TIMES DAILY AS DIRECTED 100 each 1   ??? pregabalin (LYRICA) 75 MG capsule Take 1 capsule (75 mg total) by mouth 2 (two) times daily 60 capsule 6   ??? sildenafiL, pulm.hypertension, (REVATIO) 20 mg tablet TAKE 2 TABLETS (40 MG TOTAL) BY G-TUBE THREE TIMES DAILY 180 tablet 6     No current facility-administered medications for this visit.        Allergies:  No Known Allergies    Family History:  Family History   Problem Relation Age of Onset   ??? Hypertension Mother    ??? Cancer Mother    ??? Heart disease Mother    ??? Heart disease Father    ??? Cirrhosis Neg Hx    ??? Liver cancer Neg Hx    ??? Anemia Neg Hx    ??? Diabetes Neg Hx    ??? Kidney disease Neg Hx    ??? Obesity Neg Hx    ??? Thyroid disease Neg Hx    ??? Osteoporosis Neg Hx    ??? Coronary artery disease Neg Hx    ??? Anesthesia problems Neg Hx    ??? Basal cell carcinoma Neg Hx    ??? Squamous cell carcinoma Neg Hx         Social History:  Social History     Tobacco Use   ??? Smoking status: Never Smoker   ??? Smokeless tobacco: Never Used   ??? Tobacco comment: Smoked in high school for about 5 years.    Substance Use Topics   ??? Alcohol use: No   ??? Drug use: No       Review of Systems:  Negative except as per HPI.      Objective:     Lab Review:  Lab Results   Component Value Date    A1C 6.4 (H) 09/21/2017       Lab Results   Component Value Date    CREATININE 1.73 (H) 05/07/2018       Lab Results   Component Value Date    TSH 1.970 04/10/2017 Results for EZRAH, PANNING (MRN [id number])  as of 07/16/2018 11:43   Ref. Range 11/01/2016 06:29 11/02/2016 06:18 12/26/2016 08:58 02/06/2017 09:30 04/10/2017 09:55   TSH Latest Ref Range: 0.600 - 3.300 uIU/mL 4.094 (H)  8.260 (H) 6.124 (H) 1.970   Free T4 Latest Ref Range: 0.71 - 1.40 ng/dL  8.46 (H) 9.62 (H) 9.52 (H) 1.77 (H)       Radiology Review:  None    Other Medical Data:  None    Assessment/Plan:  1. Abnormal thyroid labs. Likely from amiodarone. Clinically euthyroid today. Recommend q6 month TSH which can be performed with PCP.  - TSH every 6 months.  ??  2. DM-2. It appears he is well controlled. Will defer to PCP. I did send in refills for Lantus (Glargine) and Novolog (Aspart) today.      Follow up: As needed. Follow up with PCP unless specific help is needed like A1c > 8%, frequent hypoglycemia, TSH > 10 or new concerning symptoms of thyroid disease.    Alben Spittle, MD  [facility] Department of Endocrinology

## 2018-07-25 MED ORDER — BASAGLAR KWIKPEN U-100 INSULIN 100 UNIT/ML (3 ML) SUBCUTANEOUS
Freq: Every evening | SUBCUTANEOUS | 0 refills | 0 days | Status: CP
Start: 2018-07-25 — End: 2018-09-27

## 2018-07-26 NOTE — Unmapped (Signed)
-----   Message from Jimmie Molly, MD sent at 07/25/2018  9:29 PM EDT -----  Regarding: RE: Not covered  Thanks Wynona Canes,    I sent in for Basaglar (glargine). All future requests should be forwarded to the patient's PCP. I didn't realize at first, but I'm not actually managing his diabetes.    Lequita Halt  ----- Message -----  From: Tomie China, MA  Sent: 07/25/2018   3:07 PM EDT  To: Jimmie Molly, MD  Subject: FW: Not covered                                  So on patient's formulary are: Basaglar, Levemir, Levemir Flextouch or Evaristo Bury Flextouch per [facility] [employer].    Please advise    ----- Message -----  From: Jimmie Molly, MD  Sent: 07/18/2018   1:09 PM EDT  To: Tomie China, MA  Subject: FW: Not covered                                  Will you please work with this patient's pharmacist to find out what is covered. Tresiba (degludec), Basaglar (glargine), Levemir (Detemir) would be preferred in that order.    Thanks,  Lequita Halt  ----- Message -----  From: Algis Liming  Sent: 07/18/2018  10:41 AM EDT  To: Jimmie Molly, MD  Subject: RE: Not covered                                  Patient has [employer]. Im not sure what is on there formulary but here is the phone number if you want to call ([phone]).    Thank you   Valentina Lucks   [facility]  ----- Message -----  From: Jimmie Molly, MD  Sent: 07/17/2018   9:46 PM EDT  To: Algis Liming  Subject: RE: Not covered                                  What is on the formulary?    Thanks,  Lequita Halt  ----- Message -----  From: Algis Liming  Sent: 07/16/2018   1:33 PM EDT  To: Jimmie Molly, MD  Subject: Not covered                                      Hello, Lantus is not covered on the patients insurance. You can do a prior authorization (call [phone]). If you have any questions please call the [facility]   [phone].     Thank you   Valentina Lucks  [facility]   [phone]

## 2018-07-26 NOTE — Unmapped (Signed)
Per Dr. Yetta Barre - he's not managing patient's diabetes - Any DM medication refill will  need to go to PCP -

## 2018-07-30 MED ORDER — MIRTAZAPINE 15 MG TABLET
ORAL_TABLET | ORAL | 3 refills | 0 days | Status: CP
Start: 2018-07-30 — End: 2019-07-30
  Filled 2018-09-26: qty 90, 90d supply, fill #0

## 2018-07-30 NOTE — Unmapped (Signed)
[facility] Refill Coordination Note    Specialty Medication(s) to be Shipped:   Transplant: mycophenolate mofetil 250mg , cyclosporine 100mg  and cyclosporine 25mg     Other medication(s) to be shipped:   Alendronate 70mg   Sildenafil 20mg      Jeffrey Ward, DOB: Aug 19, 1950  Phone: [phone] (home)       All above HIPAA information was verified with patient.     Completed refill call assessment today to schedule patient's medication shipment from the [facility] [phone]).       Specialty medication(s) and dose(s) confirmed: Regimen is correct and unchanged.   Changes to medications: Jeffrey Ward reports no changes reported at this time.  Changes to insurance: No  Questions for the pharmacist: No    Confirmed patient received Welcome Packet with first shipment. The patient will receive a drug information handout for each medication shipped and additional FDA Medication Guides as required.       DISEASE/MEDICATION-SPECIFIC INFORMATION        N/A    SPECIALTY MEDICATION ADHERENCE     Medication Adherence    Patient reported X missed doses in the last month:  0  Support network for adherence:  family member           Cyclosporine 100 mg: 10 days of medicine on hand   Cyclosporine 25 mg: 10 days of medicine on hand   Mycophenolate 250 mg: 10 days of medicine on hand         SHIPPING     Shipping address confirmed in Epic.     Delivery Scheduled: Yes, Expected medication delivery date: 08/08/18.     Medication will be delivered via UPS to the home address in Epic WAM.    Jeffrey Ward   [facility] Specialty Technician

## 2018-08-01 NOTE — Unmapped (Signed)
Called wife and she confirmed she will get labs drawn tomorrow for patient.

## 2018-08-02 ENCOUNTER — Ambulatory Visit: Admit: 2018-08-02 | Discharge: 2018-08-03 | Payer: MEDICARE

## 2018-08-02 DIAGNOSIS — D899 Disorder involving the immune mechanism, unspecified: Secondary | ICD-10-CM

## 2018-08-02 DIAGNOSIS — Z79899 Other long term (current) drug therapy: Secondary | ICD-10-CM

## 2018-08-02 DIAGNOSIS — Z944 Liver transplant status: Secondary | ICD-10-CM

## 2018-08-02 LAB — COMPREHENSIVE METABOLIC PANEL
ALBUMIN: 4 g/dL (ref 3.5–5.0)
ALBUMIN: 3.8 g/dL (ref 3.5–5.0)
ALKALINE PHOSPHATASE: 106 U/L (ref 38–126)
ALKALINE PHOSPHATASE: 97 U/L (ref 38–126)
ALT (SGPT): 9 U/L (ref <50)
ANION GAP: 6 mmol/L — ABNORMAL LOW (ref 7–15)
ANION GAP: 8 mmol/L (ref 7–15)
AST (SGOT): 14 U/L — ABNORMAL LOW (ref 19–55)
BILIRUBIN TOTAL: 0.5 mg/dL (ref 0.0–1.2)
BLOOD UREA NITROGEN: 43 mg/dL — ABNORMAL HIGH (ref 7–21)
BLOOD UREA NITROGEN: 42 mg/dL — ABNORMAL HIGH (ref 7–21)
BUN / CREAT RATIO: 23
BUN / CREAT RATIO: 23
CALCIUM: 10 mg/dL (ref 8.5–10.2)
CHLORIDE: 111 mmol/L — ABNORMAL HIGH (ref 98–107)
CHLORIDE: 112 mmol/L — ABNORMAL HIGH (ref 98–107)
CO2: 23 mmol/L (ref 22.0–30.0)
CO2: 21 mmol/L — ABNORMAL LOW (ref 22.0–30.0)
CREATININE: 1.81 mg/dL — ABNORMAL HIGH (ref 0.70–1.30)
EGFR CKD-EPI AA MALE: 42 mL/min/{1.73_m2} — ABNORMAL LOW (ref >=60)
EGFR CKD-EPI AA MALE: 43 mL/min/{1.73_m2} — ABNORMAL LOW (ref >=60)
EGFR CKD-EPI NON-AA MALE: 36 mL/min/{1.73_m2} — ABNORMAL LOW (ref >=60)
EGFR CKD-EPI NON-AA MALE: 37 mL/min/{1.73_m2} — ABNORMAL LOW (ref >=60)
GLUCOSE RANDOM: 130 mg/dL (ref 70–179)
GLUCOSE RANDOM: 136 mg/dL (ref 70–179)
POTASSIUM: 6.5 mmol/L (ref 3.5–5.0)
POTASSIUM: 5.2 mmol/L — ABNORMAL HIGH (ref 3.5–5.0)
PROTEIN TOTAL: 6.3 g/dL — ABNORMAL LOW (ref 6.5–8.3)
PROTEIN TOTAL: 6.1 g/dL — ABNORMAL LOW (ref 6.5–8.3)
SODIUM: 140 mmol/L (ref 135–145)
SODIUM: 141 mmol/L (ref 135–145)

## 2018-08-02 LAB — CBC W/ AUTO DIFF
BASOPHILS ABSOLUTE COUNT: 0 10*9/L (ref 0.0–0.1)
BASOPHILS ABSOLUTE COUNT: 0 10*9/L (ref 0.0–0.1)
BASOPHILS RELATIVE PERCENT: 0.3 %
BASOPHILS RELATIVE PERCENT: 0.2 %
EOSINOPHILS ABSOLUTE COUNT: 0.1 10*9/L (ref 0.0–0.4)
EOSINOPHILS ABSOLUTE COUNT: 0.1 10*9/L (ref 0.0–0.4)
EOSINOPHILS RELATIVE PERCENT: 1.5 %
EOSINOPHILS RELATIVE PERCENT: 1.3 %
HEMATOCRIT: 38.3 % — ABNORMAL LOW (ref 41.0–53.0)
HEMATOCRIT: 36.9 % — ABNORMAL LOW (ref 41.0–53.0)
HEMOGLOBIN: 12.8 g/dL — ABNORMAL LOW (ref 13.5–17.5)
HEMOGLOBIN: 12.6 g/dL — ABNORMAL LOW (ref 13.5–17.5)
LARGE UNSTAINED CELLS: 2 % (ref 0–4)
LYMPHOCYTES ABSOLUTE COUNT: 1.1 10*9/L — ABNORMAL LOW (ref 1.5–5.0)
LYMPHOCYTES RELATIVE PERCENT: 23.1 %
LYMPHOCYTES RELATIVE PERCENT: 26.1 %
MEAN CORPUSCULAR HEMOGLOBIN CONC: 33.3 g/dL (ref 31.0–37.0)
MEAN CORPUSCULAR HEMOGLOBIN CONC: 34.1 g/dL (ref 31.0–37.0)
MEAN CORPUSCULAR HEMOGLOBIN: 31.6 pg (ref 26.0–34.0)
MEAN CORPUSCULAR HEMOGLOBIN: 31.9 pg (ref 26.0–34.0)
MEAN CORPUSCULAR VOLUME: 94.9 fL (ref 80.0–100.0)
MEAN CORPUSCULAR VOLUME: 93.5 fL (ref 80.0–100.0)
MEAN PLATELET VOLUME: 8.5 fL (ref 7.0–10.0)
MONOCYTES ABSOLUTE COUNT: 0.2 10*9/L (ref 0.2–0.8)
MONOCYTES ABSOLUTE COUNT: 0.2 10*9/L (ref 0.2–0.8)
MONOCYTES RELATIVE PERCENT: 4.7 %
MONOCYTES RELATIVE PERCENT: 5 %
NEUTROPHILS ABSOLUTE COUNT: 2.8 10*9/L (ref 2.0–7.5)
NEUTROPHILS RELATIVE PERCENT: 68.6 %
NEUTROPHILS RELATIVE PERCENT: 65.7 %
PLATELET COUNT: 89 10*9/L — ABNORMAL LOW (ref 150–440)
PLATELET COUNT: 81 10*9/L — ABNORMAL LOW (ref 150–440)
RED BLOOD CELL COUNT: 4.04 10*12/L — ABNORMAL LOW (ref 4.50–5.90)
RED BLOOD CELL COUNT: 3.95 10*12/L — ABNORMAL LOW (ref 4.50–5.90)
RED CELL DISTRIBUTION WIDTH: 15.4 % — ABNORMAL HIGH (ref 12.0–15.0)
RED CELL DISTRIBUTION WIDTH: 15.2 % — ABNORMAL HIGH (ref 12.0–15.0)
WBC ADJUSTED: 5 10*9/L (ref 4.5–11.0)

## 2018-08-02 LAB — PROTIME: Lab: 12

## 2018-08-02 LAB — BILIRUBIN DIRECT: Bilirubin.glucuronidated:MCnc:Pt:Ser/Plas:Qn:: 0.1

## 2018-08-02 LAB — LIPASE: Triacylglycerol lipase:CCnc:Pt:Ser/Plas:Qn:: 26 — ABNORMAL LOW

## 2018-08-02 LAB — NEUTROPHILS ABSOLUTE COUNT: Lab: 2.8

## 2018-08-02 LAB — ANION GAP: Anion gap 3:SCnc:Pt:Ser/Plas:Qn:: 8

## 2018-08-02 LAB — GAMMA GLUTAMYL TRANSFERASE: Gamma glutamyl transferase:CCnc:Pt:Ser/Plas:Qn:: 30

## 2018-08-02 LAB — MAGNESIUM
Magnesium:MCnc:Pt:Ser/Plas:Qn:: 1.5 — ABNORMAL LOW
Magnesium:MCnc:Pt:Ser/Plas:Qn:: 1.7

## 2018-08-02 LAB — PHOSPHORUS
Phosphate:MCnc:Pt:Ser/Plas:Qn:: 3
Phosphate:MCnc:Pt:Ser/Plas:Qn:: 3.5

## 2018-08-02 LAB — PROTIME-INR: PROTIME: 12 s (ref 10.2–13.1)

## 2018-08-02 LAB — NEUTROPHILS RELATIVE PERCENT: Lab: 68.6

## 2018-08-02 LAB — ALT (SGPT): Alanine aminotransferase:CCnc:Pt:Ser/Plas:Qn:: 9

## 2018-08-02 NOTE — Unmapped (Signed)
Per NP Renee Rival patient to go to ER for Hyperkalemia.  Patient and wife verbalized understanding.

## 2018-08-02 NOTE — Unmapped (Signed)
[facility] Emergency Department Provider Note      ED Course, Assessment and Plan     Initial Clinical Impression:    Aug 02, 2018 4:33 PM   Jeffrey Ward is a 68 y.o. male with PMH of cirrhosis s/p liver transplant followed by [facility] Liver transplant, A-fib on, T2DM, and HLD who presents for evaluation of hyperkalemia after routine lab work today as described below. On exam, his physical exam was overall benign.     BP 142/76  - Pulse 86  - Temp 36.9 ??C (98.4 ??F) (Oral)  - Resp 20  - Ht 174 cm (5' 8.5)  - Wt 77.1 kg (170 lb)  - SpO2 98%  - BMI 25.47 kg/m??     Differential diagnosis includes lab error vs liver failure vs hyperkalemia    Will obtain repeat labs. Anticipate discharge if lab work is WNL.     ED Course:  1730 Results discussed with patient. I discussed plan to discharge the patient and the need for follow up. Pt agrees to plan. I discussed strict return precautions with the patient, which were included in my discharge instructions. Patient expresses understanding and agrees to return to the ED if symptoms worsen.      _____________________________________________________________________    The case was discussed with attending physician who is in agreement with the above assessment and plan    Dictation software was used while making this note. Please excuse any errors made with dictation software.    Additional Medical Decision Making     I have reviewed the vital signs and the nursing notes. Labs and radiology results that were available during my care of the patient were independently reviewed by me and considered in my medical decision making.     I independently visualized the EKG tracing if performed  I independently visualized the radiology images if performed  I reviewed the patient's prior medical records if available.  Additional history obtained from family if available    History     CHIEF COMPLAINT:   Chief Complaint   Patient presents with   ??? Abnormal Lab       HPI: Jeffrey Ward is a 68 y.o. male with PMH of cirrhosis s/p liver transplant followed by [facility] Liver transplant, A-fib on, T2DM, and HLD who presents for evaluation of hyperkalemia after routine lab work today. Potassium level 6.5 with today's blood work. The pt is asymptomatic.     PAST MEDICAL HISTORY/PAST SURGICAL HISTORY:   Past Medical History:   Diagnosis Date   ??? Atrial fibrillation (CMS-HCC)    ??? Basal cell carcinoma    ??? Cancer (CMS-HCC)    ??? Cirrhosis (CMS-HCC)    ??? Depression    ??? Diabetes (CMS-HCC)    ??? Headache    ??? Hepatitis C 07/17/2012   ??? History of transfusion    ??? Liver disease    ??? Low back pain 07/17/2012   ??? Varices, esophageal (CMS-HCC)        Past Surgical History:   Procedure Laterality Date   ??? ANKLE SURGERY     ??? BACK SURGERY     ??? BACK SURGERY     ??? CARDIAC SURGERY      pacemaker   ??? CHG US GUIDE, TISSUE ABLATION N/A 01/22/2016    Procedure: ULTRASOUND GUIDANCE FOR, AND MONITORING OF, PARENCHYMAL TISSUE ABLATION;  Surgeon: Particia Nearing, MD;  Location: MAIN OR [facility];  Service: Transplant   ??? IR EMBOLIZATION ORGAN ISCHEMIA,  TUMORS, INFAR  06/16/2016    IR EMBOLIZATION ORGAN ISCHEMIA, TUMORS, INFAR 06/16/2016 Ammie Dalton, MD IMG VIR [facility]   ??? PR COLON CA SCRN NOT HI RSK IND N/A 02/27/2015    Procedure: COLOREC CNCR SCR;COLNSCPY NO;  Surgeon: Vonda Antigua, MD;  Location: GI PROCEDURES MEMORIAL [facility];  Service: Gastroenterology   ??? PR ENDOSCOPIC ULTRASOUND EXAM N/A 02/27/2015    Procedure: UGI ENDO; W/ENDO ULTRASOUND EXAM INCLUDES ESOPHAGUS, STOMACH, &/OR DUODENUM/JEJUNUM;  Surgeon: Vonda Antigua, MD;  Location: GI PROCEDURES MEMORIAL [facility];  Service: Gastroenterology   ??? PR ERCP BALLOON DILATE BILIARY/PANC DUCT/AMPULLA EA N/A 02/10/2017    Procedure: ERCP;WITH TRANS-ENDOSCOPIC BALLOON DILATION OF BILIARY/PANCREATIC DUCT(S) OR OF AMPULLA, INCLUDING SPHINCTERECTOMY, WHEN PERFOREMD,EACH DUCT (16109);  Surgeon: Mayford Knife, MD;  Location: GI PROCEDURES MEMORIAL [facility];  Service: Gastroenterology   ??? PR ERCP REMOVE FOREIGN BODY/STENT BILIARY/PANC DUCT N/A 02/10/2017    Procedure: ENDOSCOPIC RETROGRADE CHOLANGIOPANCREATOGRAPHY (ERCP); W/ REMOVAL OF FOREIGN BODY/STENT FROM BILIARY/PANCREATIC DUCT(S);  Surgeon: Mayford Knife, MD;  Location: GI PROCEDURES MEMORIAL [facility];  Service: Gastroenterology   ??? PR ERCP REMOVE FOREIGN BODY/STENT BILIARY/PANC DUCT N/A 06/29/2017    Procedure: ENDOSCOPIC RETROGRADE CHOLANGIOPANCREATOGRAPHY (ERCP); W/ REMOVAL OF FOREIGN BODY/STENT FROM BILIARY/PANCREATIC DUCT(S);  Surgeon: Vonda Antigua, MD;  Location: GI PROCEDURES MEMORIAL [facility];  Service: Gastroenterology   ??? PR ERCP STENT PLACEMENT BILIARY/PANCREATIC DUCT N/A 11/29/2016    Procedure: ENDOSCOPIC RETROGRADE CHOLANGIOPANCREATOGRAPHY (ERCP); WITH PLACEMENT OF ENDOSCOPIC STENT INTO BILIARY OR PANCREATIC DUCT;  Surgeon: Chriss Driver, MD;  Location: GI PROCEDURES MEMORIAL [facility];  Service: Gastroenterology   ??? PR ERCP STENT PLACEMENT BILIARY/PANCREATIC DUCT N/A 04/26/2017    Procedure: ENDOSCOPIC RETROGRADE CHOLANGIOPANCREATOGRAPHY (ERCP); WITH PLACEMENT OF ENDOSCOPIC STENT INTO BILIARY OR PANCREATIC DUCT;  Surgeon: Mayford Knife, MD;  Location: GI PROCEDURES MEMORIAL [facility];  Service: Gastroenterology   ??? PR ERCP,W/REMOVAL STONE,BIL/PANCR DUCTS N/A 11/29/2016    Procedure: ERCP; W/ENDOSCOPIC RETROGRADE REMOVAL OF CALCULUS/CALCULI FROM BILIARY &/OR PANCREATIC DUCTS;  Surgeon: Chriss Driver, MD;  Location: GI PROCEDURES MEMORIAL [facility];  Service: Gastroenterology   ??? PR ERCP,W/REMOVAL STONE,BIL/PANCR DUCTS N/A 02/10/2017    Procedure: ERCP; W/ENDOSCOPIC RETROGRADE REMOVAL OF CALCULUS/CALCULI FROM BILIARY &/OR PANCREATIC DUCTS;  Surgeon: Mayford Knife, MD;  Location: GI PROCEDURES MEMORIAL [facility];  Service: Gastroenterology   ??? PR ERCP,W/REMOVAL STONE,BIL/PANCR DUCTS N/A 04/26/2017    Procedure: ERCP; W/ENDOSCOPIC RETROGRADE REMOVAL OF CALCULUS/CALCULI FROM BILIARY &/OR PANCREATIC DUCTS;  Surgeon: Mayford Knife, MD; Location: GI PROCEDURES MEMORIAL [facility];  Service: Gastroenterology   ??? PR ERCP,W/REMOVAL STONE,BIL/PANCR DUCTS N/A 06/29/2017    Procedure: ERCP; W/ENDOSCOPIC RETROGRADE REMOVAL OF CALCULUS/CALCULI FROM BILIARY &/OR PANCREATIC DUCTS;  Surgeon: Vonda Antigua, MD;  Location: GI PROCEDURES MEMORIAL [facility];  Service: Gastroenterology   ??? PR ESOPHAGOSCP RIG TRANSORAL HYPOPHARYNX CRV ESOPH N/A 04/04/2018    Procedure: ESOPHAGOSCOPY, RIGID, TRANSORAL WITH DIVERTICULECTOMY OF HYPOPHARYNX OR CERVICAL ESOPHAGUS, WITH CRICOPHARYNGEAL MYOTOMY, INCLUDES USE OF TELESCOPE OR OPERATING MICROSCOPE AND REPAIR, WHEN PERFORMED;  Surgeon: Vista Deck, MD;  Location: MAIN OR [facility];  Service: ENT   ??? PR INSER HEART TEMP PACER ONE CHMBR N/A 10/02/2016    Procedure: Tempoarary Pacemaker Insertion;  Surgeon: Meredith Leeds, MD;  Location: [facility];  Service: Cardiology   ??? PR LAP,DIAGNOSTIC ABDOMEN N/A 01/22/2016    Procedure: Laparoscopy, Abdomen, Peritoneum, & Omentum, Diagnostic, W/Wo Collection Specimen(S) By Brushing Or Washing;  Surgeon: Particia Nearing, MD;  Location: MAIN OR [facility];  Service: Transplant   ??? PR  PLACE PERCUT GASTROSTOMY TUBE N/A 11/17/2016    Procedure: UGI ENDO; W/DIRECTED PLCMT PERQ GASTROSTOMY TUBE;  Surgeon: Cletis Athens, MD;  Location: GI PROCEDURES MEMORIAL [facility];  Service: Gastroenterology   ??? PR TRACHEOSTOMY, PLANNED N/A 09/29/2016    Procedure: TRACHEOSTOMY PLANNED (SEPART PROC);  Surgeon: Katherina Mires, MD;  Location: MAIN OR [facility];  Service: Trauma   ??? PR TRANSCATH INSERT OR REPLACE LEADLESS PM VENTR N/A 10/11/2016    Procedure: Pacemaker Implant/Replace Leadless;  Surgeon: Meredith Leeds, MD;  Location: [facility];  Service: Cardiology   ??? PR TRANSPLANT LIVER,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    Procedure: Liver Allotransplantation; Orthotopic, Partial Or Whole, From Cadaver Or Living Donor, Any Age;  Surgeon: Doyce Loose, MD;  Location: MAIN OR [facility];  Service: Transplant   ??? PR TRANSPLANT,PREP DONOR LIVER, WHOLE N/A 09/15/2016    Procedure: Rogelia Boga Std Prep Cad Donor Whole Liver Gft Prior Tnsplnt,Inc Chole,Diss/Rem Surr Tissu Wo Triseg/Lobe Splt;  Surgeon: Doyce Loose, MD;  Location: MAIN OR [facility];  Service: Transplant   ??? PR UPPER GI ENDOSCOPY,BIOPSY N/A 07/17/2012    Procedure: UGI ENDOSCOPY; WITH BIOPSY, SINGLE OR MULTIPLE;  Surgeon: Alba Destine, MD;  Location: GI PROCEDURES MEMORIAL [facility];  Service: Gastroenterology   ??? PR UPPER GI ENDOSCOPY,DIAGNOSIS N/A 02/04/2014    Procedure: UGI ENDO, INCLUDE ESOPHAGUS, STOMACH, & DUODENUM &/OR JEJUNUM; DX W/WO COLLECTION SPECIMN, BY BRUSH OR WASH;  Surgeon: Wilburt Finlay, MD;  Location: GI PROCEDURES MEMORIAL [facility];  Service: Gastroenterology   ??? PR UPPER GI ENDOSCOPY,LIGAT VARIX N/A 11/05/2013    Procedure: UGI ENDO; W/BAND LIG ESOPH &/OR GASTRIC VARICES;  Surgeon: Wilburt Finlay, MD;  Location: GI PROCEDURES MEMORIAL [facility];  Service: Gastroenterology       MEDICATIONS:   No current facility-administered medications for this encounter.     Current Outpatient Medications:   ???  alendronate (FOSAMAX) 70 MG tablet, Take 1 tablet (70 mg total) by mouth every seven (7) days., Disp: 4 tablet, Rfl: 11  ???  bisacodyl (DULCOLAX, BISACODYL,) 5 mg EC tablet, Take 5 mg by mouth daily as needed. , Disp: , Rfl:   ???  blood sugar diagnostic Strp, Use three (3) times a day before meals., Disp: 100 each, Rfl: 7  ???  blood-glucose meter (GLUCOSE MONITORING KIT) kit, Use as instructed, Disp: 1 each, Rfl: 0  ???  calcium carbonate (OS-CAL) 500 mg calcium (1,250 mg) chewable tablet, Chew 2 tablets Two (2) times a day. , Disp: , Rfl:   ???  cycloSPORINE modified (NEORAL) 100 MG capsule, Take 2 capsules (200 mg) by mouth in the morning and 1 capsule in the evening with three 25mg  capsules (For a total daily dose of 200mg  each morning and 175mg  every evening), Disp: 90 each, Rfl: 11  ???  cycloSPORINE modified (NEORAL) 25 MG capsule, Take 3 capsules (75mg ) by mouth every evening with a 100mg  capsule every evening (For a total dose of 175mg  every evening), Disp: 90 each, Rfl: 11  ???  ferrous sulfate 325 (65 FE) MG tablet, Take 325 mg by mouth., Disp: , Rfl:   ???  insulin ASPART (NOVOLOG FLEXPEN U-100 INSULIN) 100 unit/mL (3 mL) injection pen, Inject 0.04 mL (4 Units total) under the skin Three (3) times a day with meals. Add extra units if sugar is high, max daily dose 30 units., Disp: 15 mL, Rfl: 0  ???  insulin glargine (BASAGLAR KWIKPEN U-100 INSULIN) 100 unit/mL (3 mL) injection pen, Inject 0.12 mL (12 Units  total) under the skin nightly., Disp: 15 mL, Rfl: 0  ???  magnesium oxide-Mg AA chelate (MAGNESIUM, AMINO ACID CHELATE,) 133 mg Tab, Take 2 tablets by mouth Two (2) times a day., Disp: 100 tablet, Rfl: PRN  ???  melatonin 3 mg Tab, Take 1 tablet (3 mg total) by mouth nightly., Disp: 30 tablet, Rfl: 0  ???  mirtazapine (REMERON) 15 MG tablet, TAKE 1 TABLET BY MOUTH NIGHTLY, Disp: 90 tablet, Rfl: 3  ???  mycophenolate (CELLCEPT) 250 mg capsule, TAKE 2 CAPSULES (500MG ) BY MOUTH TWICE DAILY, Disp: 120 capsule, Rfl: 7  ???  nitrofurantoin, macrocrystal-monohydrate, (MACROBID) 100 MG capsule, Take 1 capsule (100 mg total) by mouth twice daily until gone. (Patient not taking: Reported on 05/07/2018), Disp: 13 capsule, Rfl: 0  ???  pen needle, diabetic 32 gauge x 5/32 Ndle, USE WITH INSULIN PEN 4 TIMES DAILY AS DIRECTED, Disp: 100 each, Rfl: 1  ???  pregabalin (LYRICA) 75 MG capsule, Take 1 capsule (75 mg total) by mouth 2 (two) times daily, Disp: 60 capsule, Rfl: 6  ???  sildenafiL, pulm.hypertension, (REVATIO) 20 mg tablet, TAKE 2 TABLETS (40 MG TOTAL) BY G-TUBE THREE TIMES DAILY, Disp: 180 tablet, Rfl: 6    ALLERGIES:   Patient has no known allergies.    SOCIAL HISTORY:   Social History     Tobacco Use   ??? Smoking status: Never Smoker   ??? Smokeless tobacco: Never Used   ??? Tobacco comment: Smoked in high school for about 5 years.    Substance Use Topics   ??? Alcohol use: No       FAMILY HISTORY: Family History   Problem Relation Age of Onset   ??? Hypertension Mother    ??? Cancer Mother    ??? Heart disease Mother    ??? Heart disease Father    ??? Cirrhosis Neg Hx    ??? Liver cancer Neg Hx    ??? Anemia Neg Hx    ??? Diabetes Neg Hx    ??? Kidney disease Neg Hx    ??? Obesity Neg Hx    ??? Thyroid disease Neg Hx    ??? Osteoporosis Neg Hx    ??? Coronary artery disease Neg Hx    ??? Anesthesia problems Neg Hx    ??? Basal cell carcinoma Neg Hx    ??? Squamous cell carcinoma Neg Hx           Review of Systems    A 10 point review of systems was performed and is negative other than positive elements noted in HPI   Constitutional: Negative for fever.  Eyes: Negative for visual changes.  ENT: Negative for sore throat.  Cardiovascular: No chest pain.  Respiratory: Negative for shortness of breath.  Gastrointestinal: Negative for abdominal pain, vomiting or diarrhea.  Genitourinary: Negative for dysuria.  Musculoskeletal: Negative for back pain.  Skin: Negative for rash.  Neurological: Negative for headaches, focal weakness or numbness.    Physical Exam     VITAL SIGNS:    BP 142/76  - Pulse 86  - Temp 36.9 ??C (98.4 ??F) (Oral)  - Resp 20  - Ht 174 cm (5' 8.5)  - Wt 77.1 kg (170 lb)  - SpO2 98%  - BMI 25.47 kg/m??     Constitutional: Alert and oriented. Well appearing and in no distress.  Eyes: Conjunctivae are normal.  ENT       Head: Normocephalic and atraumatic.       Nose: No  congestion.       Mouth/Throat: Mucous membranes are moist.       Neck: No stridor.  Hematological/Lymphatic/Immunilogical: No cervical lymphadenopathy.  Cardiovascular: S1, S2,  Normal and symmetric distal pulses are present in all extremities.Warm and well perfused.  Respiratory: Normal respiratory effort. Breath sounds are normal.  Gastrointestinal: Soft and nontender. There is no CVA tenderness.  Musculoskeletal: Normal range of motion in all extremities.       Right lower leg: No tenderness or edema.       Left lower leg: No tenderness or edema.  Neurologic: Normal speech and language. No gross focal neurologic deficits are appreciated.  Skin: Skin is warm, dry and intact. No rash noted.  Psychiatric: Mood and affect are normal. Speech and behavior are normal.      Pertinent labs & imaging results that were available during my care of the patient were reviewed by me and considered in my medical decision making (see chart for details).    Please note- This chart has been created using [employer]. Chart creation errors have been sought, but may not always be located and such creation errors, especially pronoun confusion, do NOT reflect on the standard of medical care.    Documentation assistance was provided by Nechama Guard, Scribe, on Aug 02, 2018 at 4:33 PM for Mina Marble, MD.    Documentation assistance was provided by the scribe in my presence.  The documentation recorded by the scribe has been reviewed by me and accurately reflects the services I personally performed.         Carole Binning, MD  Resident  08/02/18 [phone]

## 2018-08-02 NOTE — Unmapped (Signed)
Pt stating he had liver transplant about a year ago. Pt's liver transplant coordinator called for abnormal labs. Pt was told his potassium was elevated today and to come to the ED.  Elder Mistreatment Screening and Response Tool (EM-SART) completed. Pt screened negative.

## 2018-08-03 LAB — CYCLOSPORINE, TROUGH: Lab: 135

## 2018-08-07 MED FILL — ALENDRONATE 70 MG TABLET: ORAL | 28 days supply | Qty: 4 | Fill #5

## 2018-08-07 MED FILL — ALENDRONATE 70 MG TABLET: 28 days supply | Qty: 4 | Fill #5 | Status: AC

## 2018-08-07 MED FILL — CYCLOSPORINE MODIFIED 25 MG CAPSULE: 30 days supply | Qty: 90 | Fill #2

## 2018-08-07 MED FILL — CYCLOSPORINE MODIFIED 100 MG CAPSULE: 30 days supply | Qty: 90 | Fill #2

## 2018-08-07 MED FILL — MYCOPHENOLATE MOFETIL 250 MG CAPSULE: 30 days supply | Qty: 120 | Fill #4 | Status: AC

## 2018-08-07 MED FILL — SILDENAFIL (PULMONARY HYPERTENSION) 20 MG TABLET: 30 days supply | Qty: 180 | Fill #2

## 2018-08-07 MED FILL — CYCLOSPORINE MODIFIED 100 MG CAPSULE: 30 days supply | Qty: 90 | Fill #2 | Status: AC

## 2018-08-07 MED FILL — MYCOPHENOLATE MOFETIL 250 MG CAPSULE: 30 days supply | Qty: 120 | Fill #4

## 2018-08-07 MED FILL — CYCLOSPORINE MODIFIED 25 MG CAPSULE: 30 days supply | Qty: 90 | Fill #2 | Status: AC

## 2018-08-07 MED FILL — SILDENAFIL (PULMONARY HYPERTENSION) 20 MG TABLET: 30 days supply | Qty: 180 | Fill #2 | Status: AC

## 2018-08-07 NOTE — Unmapped (Signed)
Jeffrey Ward 's Lyrica, Novolog, mirtazapine, test strips, basaglar, and magnesium shipment will be delayed due to Cost Increase We have contacted the patient and communicated the delivery change to patient/caregiver We will wait for a call back from the patient/caregiver to reschedule the delivery.  We have confirmed the delivery date as NA .

## 2018-08-09 MED FILL — MG-PLUS-PROTEIN 133 MG TABLET: 25 days supply | Qty: 100 | Fill #4 | Status: AC

## 2018-08-09 MED FILL — MG-PLUS-PROTEIN 133 MG TABLET: ORAL | 25 days supply | Qty: 100 | Fill #4

## 2018-08-16 ENCOUNTER — Institutional Professional Consult (permissible substitution): Admit: 2018-08-16 | Discharge: 2018-08-16 | Payer: MEDICARE

## 2018-08-16 DIAGNOSIS — R001 Bradycardia, unspecified: Secondary | ICD-10-CM

## 2018-08-16 DIAGNOSIS — Z45018 Encounter for adjustment and management of other part of cardiac pacemaker: Principal | ICD-10-CM

## 2018-08-16 NOTE — Unmapped (Signed)
Spoke with Pt in regards to sending a manual transmission for the EP Device Remote Monitoring. Will transmit today.Instructed to call back to EP Remote Device Monitoring if need further assistance.

## 2018-08-16 NOTE — Unmapped (Signed)
Cardiac Implantable Electronic Device Remote Monitoring     Visit Date:  08/16/2018    Findings: Normal device function, Tested Lead measurements stable and within normal range, Adequate battery reserve->8 years    Arrhythmias: No significant new atrial or ventricular arrhythmias    Plan: Continue Routine Remote Monitoring   __________________________________________________________________    Manufacturer of Device: [employer]onic       Type of Device: Single Chamber Pacemaker  See scanned/downloaded PDF report for model numbers, serial numbers, and date(s) of implant.    Presenting Rhythm: VP    ______________________________________________________________________  Percentage Biventricular Pacing: n/a    Heart Failure Monitoring: Not Applicable  ______________________________________________________________________      Please see downloaded PDF file of transmission under Media Tab  for full details of device interrogation to include, when applicable,  battery status/charge time, lead trend data, and programmed parameters.

## 2018-08-29 NOTE — Unmapped (Signed)
[facility] Refill Coordination Note    Specialty Medication(s) to be Shipped:   Transplant: mycophenolate mofetil 250mg , cyclosporine 25mg , cyclosporine 100mg  and Sildenafil 20mg   Other medication(s) to be shipped: Alendronate 70mg  & Mag Plus Protein 133mg      Jeffrey Ward, DOB: 22-Jan-1951  Phone: [phone] (home)     All above HIPAA information was verified with patient.     Completed refill call assessment today to schedule patient's medication shipment from the [facility] [phone]).       Specialty medication(s) and dose(s) confirmed: Regimen is correct and unchanged.   Changes to medications: Dickey reports no changes reported at this time.  Changes to insurance: No  Questions for the pharmacist: No    Confirmed patient received Welcome Packet with first shipment. The patient will receive a drug information handout for each medication shipped and additional FDA Medication Guides as required.       DISEASE/MEDICATION-SPECIFIC INFORMATION        N/A    SPECIALTY MEDICATION ADHERENCE     Medication Adherence    Patient reported X missed doses in the last month:  0  Specialty Medication:  Mycophenolate 250mg   Patient is on additional specialty medications:  Yes  Additional Specialty Medications:  Cyclosporine 25mg   Patient Reported Additional Medication X Missed Doses in the Last Month:  0  Patient is on more than two specialty medications:  Yes  Specialty Medication:  Cyclsoporine 100mg   Patient Reported Additional Medication X Missed Doses in the Last Month:  0  Support network for adherence:  family member        Cyclosporine 25 mg: 10 days of medicine on hand   Cyclosporine 100 mg: 10 days of medicine on hand   Mycophenolate 250 mg: 10 days of medicine on hand     SHIPPING     Shipping address confirmed in Epic.     Delivery Scheduled: Yes, Expected medication delivery date: 09/07/2018.     Medication will be delivered via UPS to the home address in Epic [place]. Jeffrey Ward P [name]   [facility] Specialty Technician

## 2018-09-05 ENCOUNTER — Other Ambulatory Visit: Payer: Self-pay | Admitting: Orthopedic Surgery

## 2018-09-05 DIAGNOSIS — M25361 Other instability, right knee: Secondary | ICD-10-CM

## 2018-09-05 DIAGNOSIS — G8929 Other chronic pain: Secondary | ICD-10-CM

## 2018-09-05 DIAGNOSIS — M25561 Pain in right knee: Secondary | ICD-10-CM

## 2018-09-05 DIAGNOSIS — M2391 Unspecified internal derangement of right knee: Secondary | ICD-10-CM

## 2018-09-06 MED FILL — SILDENAFIL (PULMONARY HYPERTENSION) 20 MG TABLET: 30 days supply | Qty: 180 | Fill #3

## 2018-09-06 MED FILL — MG-PLUS-PROTEIN 133 MG TABLET: ORAL | 25 days supply | Qty: 100 | Fill #5

## 2018-09-06 MED FILL — ALENDRONATE 70 MG TABLET: ORAL | 28 days supply | Qty: 4 | Fill #6

## 2018-09-06 MED FILL — CYCLOSPORINE MODIFIED 25 MG CAPSULE: 30 days supply | Qty: 90 | Fill #3

## 2018-09-06 MED FILL — CYCLOSPORINE MODIFIED 100 MG CAPSULE: 30 days supply | Qty: 90 | Fill #3 | Status: AC

## 2018-09-06 MED FILL — CYCLOSPORINE MODIFIED 25 MG CAPSULE: 30 days supply | Qty: 90 | Fill #3 | Status: AC

## 2018-09-06 MED FILL — ALENDRONATE 70 MG TABLET: 28 days supply | Qty: 4 | Fill #6 | Status: AC

## 2018-09-06 MED FILL — MYCOPHENOLATE MOFETIL 250 MG CAPSULE: 30 days supply | Qty: 120 | Fill #5

## 2018-09-06 MED FILL — SILDENAFIL (PULMONARY HYPERTENSION) 20 MG TABLET: 30 days supply | Qty: 180 | Fill #3 | Status: AC

## 2018-09-06 MED FILL — MYCOPHENOLATE MOFETIL 250 MG CAPSULE: 30 days supply | Qty: 120 | Fill #5 | Status: AC

## 2018-09-06 MED FILL — MG-PLUS-PROTEIN 133 MG TABLET: 25 days supply | Qty: 100 | Fill #5 | Status: AC

## 2018-09-06 MED FILL — CYCLOSPORINE MODIFIED 100 MG CAPSULE: 30 days supply | Qty: 90 | Fill #3

## 2018-09-10 ENCOUNTER — Other Ambulatory Visit: Payer: Self-pay | Admitting: Orthopedic Surgery

## 2018-09-10 DIAGNOSIS — G8929 Other chronic pain: Secondary | ICD-10-CM

## 2018-09-10 DIAGNOSIS — M25361 Other instability, right knee: Secondary | ICD-10-CM

## 2018-09-10 DIAGNOSIS — M25561 Pain in right knee: Secondary | ICD-10-CM

## 2018-09-10 DIAGNOSIS — M2351 Chronic instability of knee, right knee: Secondary | ICD-10-CM

## 2018-09-10 DIAGNOSIS — M2391 Unspecified internal derangement of right knee: Secondary | ICD-10-CM

## 2018-09-24 ENCOUNTER — Ambulatory Visit
Admit: 2018-09-24 | Discharge: 2018-09-25 | Payer: MEDICARE | Attending: Student in an Organized Health Care Education/Training Program | Primary: Student in an Organized Health Care Education/Training Program

## 2018-09-24 ENCOUNTER — Ambulatory Visit: Admit: 2018-09-24 | Discharge: 2018-10-23 | Payer: MEDICARE | Attending: Audiologist | Primary: Audiologist

## 2018-09-24 DIAGNOSIS — G522 Disorders of vagus nerve: Secondary | ICD-10-CM

## 2018-09-24 DIAGNOSIS — R490 Dysphonia: Principal | ICD-10-CM

## 2018-09-24 DIAGNOSIS — J3800 Paralysis of vocal cords and larynx, unspecified: Secondary | ICD-10-CM

## 2018-09-24 NOTE — Unmapped (Signed)
Otolaryngology     Reason for visit:  Follow up     History of Present Illness:     Recall, initial presentation on 01/30/17 [name] is a 68 y.o. 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 He spent 3 months in the hospital following his transplant he had respiratory compromise and ultimately had tracheostomy tube for much of that stay. He was decannulated in September before discharge home. 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  Patient reports he lost his voice following liver transplant episode 3 months ago. He states that since surgery his voice is breathy and weak. He cannot control his pitch. His voice symptoms do not fluctuate throughout the day and he has not noticed anything that makes it better or worse. He does have a cough throughout the day. He also endorses globus sensation. He denies dysphagia. He is maintained on an oral diet but has not had his G-tube removed yet due to surgeon preference. He has never had voice issues before and has never seen an ENT doctor for any related issues. He does endorse consistent shortness of breath with exertion and notes that this is likely secondary to his recovery process from his liver transplant. She does take Prilosec for his GERD. Thinks that this is stable. No coughing or choking while eating.    He was noted to have a right vocal fold paralysis with significant breathy voice and glottal insufficiency.   For multilpe reasons intervention was delayed.   He has had quite a complicated course.     He did have anemia and hemothorax.     He underwent in office injection cymetra on 05/31/17 0.21ml right fold.     Since then, he has had multiple set backs in his GI care. He had a fall and possible liver abscess. He underwent intubation and ERCP in April. He has been admitted to the hospital twice.     08/08/17: He returns today in follow-up.  He reports no significant improvement in the voice following injection.  He continues to be weak and breathy.  He is also had increasing coughing on foods and liquids.  No pneumonia.  He has had significant illnesses since his injection however as stated above.    He was taken back to [facility] on 09/13/17 for in office [facility] injection TTH cymetra 0.65ml.     10/03/17:   He returns after injection. The voice improved but by only about 50%. The voice has started to decline. He also complains of coughing while eating. This has not changed with injection augmentation. He does state he is louder and easier, but still weak from voice standpoint.    10.21.19:   Follows up for reassessment. Per wife, voice has remained stable since last visit. Improved compared to prior to injection but still with breathiness and vocal fatigue. He was unable to complete voice therpay. He had Hip fracture surgery recently and was intubated for this. Wife states still with some coughing after meals. No regurgitation after meals. Patient states imprpoved.     03/13/18: He comes in today for preop for voice surgery however he states his swallowing has become more of an issue.  He continues to get choked on solid foods and has to regurgitate to swallow things down.  He feels like this is getting worse and is currently a larger issue than the voice.     04/04/18: OR for endoscopic co2 laser zenker diverticulotomy and botox CP     04/17/18: he comes in today for postop. His postop swallow study showed no extravasation and was dishcarged home on pod 1 stable. He denies fevers, chest pain, tachycardia. He continues to have a cough.     09/24/18: he comes in today for follow up. Regarding his voice, he is still quite frustrated. It is weak and he has vocal fatigue. He is interested in permanent vocal rehab. Recall he is now off coumadin.     With regards to swallow, he is s/p endo co2 laser zenker diverticulotomy on 04/04/18. His wife still complains that he coughs when he eats. He also has dry cough when not eating. He is able to eat all consistencies of food. He had MBS on 05/15/18 that showed minimal residue in minimal residual zenker. He did have some backflow of solids and mild pharyngeal dysphagia.      Past Medical History:  Past Medical History:   Diagnosis Date   ??? Atrial fibrillation (CMS-HCC)    ??? Basal cell carcinoma    ??? Cancer (CMS-HCC)    ??? Cirrhosis (CMS-HCC)    ??? Depression    ??? Diabetes (CMS-HCC)    ??? Headache    ??? Hepatitis C 07/17/2012   ??? History of transfusion    ??? Liver disease    ??? Low back pain 07/17/2012   ??? Varices, esophageal (CMS-HCC)        Past Surgical History:  Past Surgical History:   Procedure Laterality Date   ??? ANKLE SURGERY     ??? BACK SURGERY     ??? BACK SURGERY     ??? CARDIAC SURGERY      pacemaker   ??? CHG US GUIDE, TISSUE ABLATION N/A 01/22/2016    Procedure: ULTRASOUND GUIDANCE FOR, AND MONITORING OF, PARENCHYMAL TISSUE ABLATION;  Surgeon: Particia Nearing, MD;  Location: MAIN OR [facility];  Service: Transplant   ??? IR EMBOLIZATION ORGAN ISCHEMIA, TUMORS, INFAR  06/16/2016    IR EMBOLIZATION ORGAN ISCHEMIA, TUMORS, INFAR 06/16/2016 Ammie Dalton, MD IMG VIR H&V [facility]   ??? PR COLON CA SCRN NOT HI RSK IND N/A 02/27/2015    Procedure: COLOREC CNCR SCR;COLNSCPY NO;  Surgeon: Vonda Antigua, MD;  Location: [facility];  Service: Gastroenterology   ??? PR ENDOSCOPIC ULTRASOUND EXAM N/A 02/27/2015    Procedure: UGI ENDO; W/ENDO ULTRASOUND EXAM INCLUDES ESOPHAGUS, STOMACH, &/OR DUODENUM/JEJUNUM;  Surgeon: Vonda Antigua, MD;  Location: GI PROCEDURES MEMORIAL [facility];  Service: Gastroenterology   ??? PR ERCP BALLOON DILATE BILIARY/PANC DUCT/AMPULLA EA N/A 02/10/2017    Procedure: ERCP;WITH TRANS-ENDOSCOPIC BALLOON DILATION OF BILIARY/PANCREATIC DUCT(S) OR OF AMPULLA, INCLUDING SPHINCTERECTOMY, WHEN PERFOREMD,EACH DUCT (16109); Surgeon: Mayford Knife, MD;  Location: GI PROCEDURES [facility];  Service: Gastroenterology   ??? PR ERCP REMOVE FOREIGN BODY/STENT BILIARY/PANC DUCT N/A 02/10/2017    Procedure: ENDOSCOPIC RETROGRADE CHOLANGIOPANCREATOGRAPHY (ERCP); W/ REMOVAL OF FOREIGN BODY/STENT FROM BILIARY/PANCREATIC DUCT(S);  Surgeon: Mayford Knife, MD;  Location: GI PROCEDURES [facility];  Service: Gastroenterology   ??? PR ERCP REMOVE FOREIGN BODY/STENT BILIARY/PANC DUCT N/A 06/29/2017    Procedure: ENDOSCOPIC RETROGRADE CHOLANGIOPANCREATOGRAPHY (ERCP); W/ REMOVAL OF FOREIGN BODY/STENT FROM BILIARY/PANCREATIC DUCT(S);  Surgeon: Vonda Antigua, MD;  Location: GI PROCEDURES [facility];  Service: Gastroenterology   ??? PR ERCP STENT PLACEMENT BILIARY/PANCREATIC DUCT  N/A 11/29/2016    Procedure: ENDOSCOPIC RETROGRADE CHOLANGIOPANCREATOGRAPHY (ERCP); WITH PLACEMENT OF ENDOSCOPIC STENT INTO BILIARY OR PANCREATIC DUCT;  Surgeon: Chriss Driver, MD;  Location: GI PROCEDURES [facility];  Service: Gastroenterology   ??? PR ERCP STENT PLACEMENT BILIARY/PANCREATIC DUCT N/A 04/26/2017    Procedure: ENDOSCOPIC RETROGRADE CHOLANGIOPANCREATOGRAPHY (ERCP); WITH PLACEMENT OF ENDOSCOPIC STENT INTO BILIARY OR PANCREATIC DUCT;  Surgeon: Mayford Knife, MD;  Location: GI PROC[facility]URES [facility];  Service: Gastroenterology   ??? PR ERCP,W/REMOVAL STONE,BIL/PANCR DUCTS N/A 11/29/2016    Procedure: ERCP; W/ENDOSCOPIC RETROGRADE REMOVAL OF CALCULUS/CALCULI FROM BILIARY &/OR PANCREATIC DUCTS;  Surgeon: Chriss Driver, MD;  Location: GI PROCEDURES [facility];  Service: Gastroenterology   ??? PR ERCP,W/REMOVAL STONE,BIL/PANCR DUCTS N/A 02/10/2017    Procedure: ERCP; W/ENDOSCOPIC RETROGRADE REMOVAL OF CALCULUS/CALCULI FROM BILIARY &/OR PANCREATIC DUCTS;  Surgeon: Mayford Knife, MD;  Location: GI PROCEDURES MEMOR[facility];  Service: Gastroenterology   ??? PR ERCP,W/REMOVAL STONE,BIL/PANCR DUCTS N/A 04/26/2017    Procedure: ERCP; W/ENDOSCOPIC RETROGRADE REMOVAL OF CALCULUS/CALCULI FROM BILIARY &/OR PANCREATIC DUCTS;  Surgeon: Mayford Knife, MD;  Location: GI PROCEDURES [facility];  Service: Gastroenterology   ??? PR ERCP,W/REMOVAL STONE,BIL/PANCR DUCTS N/A 06/29/2017    Procedure: ERCP; W/ENDOSCOPIC RETROGRADE REMOVAL OF CALCULUS/CALCULI FROM BILIARY &/OR PANCREATIC DUCTS;  Surgeon: Vonda Antigua, MD;  Location: GI PROCEDURES [facility];  Service: Gastroenterology   ??? PR ESOPHAGOSCP RIG TRANSORAL HYPOPHARYNX CRV ESOPH N/A 04/04/2018    Procedure: ESOPHAGOSCOPY, RIGID, TRANSORAL WITH DIVERTICULECTOMY OF HYPOPHARYNX OR CERVICAL ESOPHAGUS, WITH CRICOPHARYNGEAL MYOTOMY, INCLUDES USE OF TELESCOPE OR OPERATING MICROSCOPE AND REPAIR, WHEN PERFORMED;  Surgeon: Vista Deck, MD;  Location: MAIN OR [facility];  Service: ENT   ??? PR INSER HEART TEMP PACER ONE CHMBR N/A 10/02/2016    Procedure: Tempoarary Pacemaker Insertion;  Surgeon: Meredith Leeds, MD;  Location: [facility];  Service: Cardiology   ??? PR LAP,DIAGNOSTIC ABDOMEN N/A 01/22/2016    Procedure: Laparoscopy, Abdomen, Peritoneum, & Omentum, Diagnostic, W/Wo Collection Specimen(S) By Brushing Or Washing;  Surgeon: Particia Nearing, MD;  Location: MAIN OR [facility];  Service: Transplant   ??? PR PLACE PERCUT GASTROSTOMY TUBE N/A 11/17/2016    Procedure: UGI ENDO; W/DIRECTED PLCMT PERQ GASTROSTOMY TUBE;  Surgeon: Cletis Athens, MD;  Location: [facility] PROC[facility]URES [facility];  Service: Gastroenterology   ??? PR TRACHEOSTOMY, PLANNED N/A 09/29/2016    Procedure: TRACHEOSTOMY PLANNED (SEPART PROC);  Surgeon: Katherina Mires, MD;  Location: MAIN OR [facility];  Service: Trauma   ??? PR TRANSCATH INSERT OR REPLACE LEADLESS PM VENTR N/A 10/11/2016    Procedure: Pacemaker Implant/Replace Leadless;  Surgeon: Meredith Leeds, MD;  Location: [facility];  Service: Cardiology   ??? PR TRANSPLANT LIVER,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    Procedure: Liver Allotransplantation; Orthotopic, Partial Or Whole, From Cadaver Or Living Donor, Any Age;  Surgeon: Doyce Loose, MD;  Location: MAIN OR [facility];  Service: Transplant   ??? PR TRANSPLANT,PREP DONOR LIVER, WHOLE N/A 09/15/2016    Procedure: Rogelia Boga Std Prep Cad Donor Whole Liver Gft Prior Tnsplnt,Inc Chole,Diss/Rem Surr Tissu Wo Triseg/Lobe Splt;  Surgeon: Doyce Loose, MD;  Location: MAIN OR [facility];  Service: Transplant   ??? PR UPPER GI ENDOSCOPY,BIOPSY N/A 07/17/2012    Procedure: UGI ENDOSCOPY; WITH BIOPSY, SINGLE OR MULTIPLE;  Surgeon: Alba Destine, MD;  Location: GI PROCEDURES [facility];  Service: Gastroenterology   ??? PR UPPER GI ENDOSCOPY,DIAGNOSIS N/A 02/04/2014    Procedure: UGI ENDO, INCLUDE ESOPHAGUS, STOMACH, & DUODENUM &/OR JEJUNUM; DX  W/WO COLLECTION SPECIMN, BY BRUSH OR WASH;  Surgeon: Wilburt Finlay, MD;  Location: GI PROCEDURES [facility];  Service: Gastroenterology   ??? PR UPPER GI ENDOSCOPY,LIGAT VARIX N/A 11/05/2013    Procedure: UGI ENDO; W/BAND LIG ESOPH &/OR GASTRIC VARICES;  Surgeon: Wilburt Finlay, MD;  Location: [facility] PROC[facility]URES [facility];  Service: Gastroenterology       Medications:    Current Outpatient Medications:   ???  alendronate (FOSAMAX) 70 MG tablet, Take 1 tablet (70 mg total) by mouth every seven (7) days., Disp: 4 tablet, Rfl: 11  ???  bisacodyl (DULCOLAX, BISACODYL,) 5 mg EC tablet, Take 5 mg by mouth daily as needed. , Disp: , Rfl:   ???  blood sugar diagnostic Strp, Use three (3) times a day before meals., Disp: 100 each, Rfl: 7  ???  blood-glucose meter (GLUCOSE MONITORING KIT) kit, Use as instructed, Disp: 1 each, Rfl: 0  ???  calcium carbonate (OS-CAL) 500 mg calcium (1,250 mg) chewable tablet, Chew 2 tablets Two (2) times a day. , Disp: , Rfl:   ???  cycloSPORINE modified (NEORAL) 100 MG capsule, Take 2 capsules (200 mg) by mouth in the morning and 1 capsule in the evening with three 25mg  capsules (For a total daily dose of 200mg  each morning and 175mg  every evening), Disp: 90 each, Rfl: 11  ???  cycloSPORINE modified (NEORAL) 25 MG capsule, Take 3 capsules (75mg ) by mouth every evening with a 100mg  capsule every evening (For a total dose of 175mg  every evening), Disp: 90 each, Rfl: 11  ???  ferrous sulfate 325 (65 FE) MG tablet, Take 325 mg by mouth., Disp: , Rfl:   ???  insulin ASPART (NOVOLOG FLEXPEN U-100 INSULIN) 100 unit/mL (3 mL) injection pen, Inject 0.04 mL (4 Units total) under the skin Three (3) times a day with meals. Add extra units if sugar is high, max daily dose 30 units., Disp: 15 mL, Rfl: 0  ???  insulin glargine (BASAGLAR KWIKPEN U-100 INSULIN) 100 unit/mL (3 mL) injection pen, Inject 0.12 mL (12 Units total) under the skin nightly., Disp: 15 mL, Rfl: 0  ???  magnesium oxide-Mg AA chelate (MAGNESIUM, AMINO ACID CHELATE,) 133 mg Tab, Take 2 tablets by mouth Two (2) times a day., Disp: 100 tablet, Rfl: PRN  ???  melatonin 3 mg Tab, Take 1 tablet (3 mg total) by mouth nightly., Disp: 30 tablet, Rfl: 0  ???  mirtazapine (REMERON) 15 MG tablet, TAKE 1 TABLET BY MOUTH NIGHTLY, Disp: 90 tablet, Rfl: 3  ???  mycophenolate (CELLCEPT) 250 mg capsule, TAKE 2 CAPSULES (500MG ) BY MOUTH TWICE DAILY, Disp: 120 capsule, Rfl: 7  ???  nitrofurantoin, macrocrystal-monohydrate, (MACROBID) 100 MG capsule, Take 1 capsule (100 mg total) by mouth twice daily until gone., Disp: 13 capsule, Rfl: 0  ???  pen needle, diabetic 32 gauge x 5/32 Ndle, USE WITH INSULIN PEN 4 TIMES DAILY AS DIRECTED, Disp: 100 each, Rfl: 1  ???  pregabalin (LYRICA) 75 MG capsule, Take 1 capsule (75 mg total) by mouth 2 (two) times daily, Disp: 60 capsule, Rfl: 6  ???  sildenafiL, pulm.hypertension, (REVATIO) 20 mg tablet, TAKE 2 TABLETS (40 MG TOTAL) BY G-TUBE THREE TIMES DAILY, Disp: 180 tablet, Rfl: 6     Allergies:  No Known Allergies    Family History:  Family History   Problem Relation Age of Onset   ??? Hypertension Mother    ??? Cancer Mother    ??? Heart disease Mother    ???  Heart disease Father    ??? Cirrhosis Neg Hx    ??? Liver cancer Neg Hx    ??? Anemia Neg Hx    ??? Diabetes Neg Hx    ??? Kidney disease Neg Hx    ??? Obesity Neg Hx    ??? Thyroid disease Neg Hx    ??? Osteoporosis Neg Hx    ??? Coronary artery disease Neg Hx    ??? Anesthesia problems Neg Hx    ??? Basal cell carcinoma Neg Hx    ??? Squamous cell carcinoma Neg Hx      Social History:  Social History     Tobacco Use   ??? Smoking status: Never Smoker   ??? Smokeless tobacco: Never Used   ??? Tobacco comment: Smoked in high school for about 5 years.    Substance Use Topics   ??? Alcohol use: No   ??? Drug use: No       Review of Systems:  Denies chest pain, sob, fevers, n/v, abd pain      Physical Exam:   Constitutional:  Vitals reviewed in chart, patient has normal appearance. Well nourished, well-developed, no acute distress  Voice:  Hoarse, breathy, weak voice  Respiration:  Breathing comfortably, no stridor.  CV: No clubbing/cyanosis/edema in hands.   Eyes:  extraocular motion intact, sclera normal.   Neuro:  Alert and oriented times 3, Cranial nerves 2-12 intact and symmetric bilaterally. Except X.  Head and Face:  Skin with no masses or lesions, sinuses nontender to palpation, facial nerve fully intact.   Salivary Glands:  Parotid and submandibular glands normal bilaterally.   Ears:  Normal external ears  Nose:  External nose midline, anterior rhinoscopy is normal with limited visualization just to the anterior interior turbinate.   Oral Cavity/Oropharynx/Lips:  Normal mucous membranes, normal floor of mouth/tongue/oropharynx, no masses or lesions are noted.  Good dentition  Pharyngeal Walls:  No masses noted.  Neck/Lymph:  No lymphadenopathy, no thyroid masses. Previous tracheostoma scar well healed.  Larynx: Mirror evaluation insufficient to visualize secondary to prominent gag    Procedure Note    Endoscopy Type:  Flexible Fiberoptic Videostroboscopy    Indications/TimeOut:  To better evaluate the patient???s symptoms, fiberoptic videostroboscopy is indicated.   A time out identifying the patient, the procedure, the location of the procedure and any concerns was performed prior to beginning the procedure.    Procedure Details:    The patient was placed in the sitting position.  After topical anesthesia and decongestion with oxymetazoline and 1% lidocaine, the flexible laryngoscope was passed.  The microphone was used to trigger the xenon stroboscopic light source.    -  Nasal cavity  - There was no pus or polyps noted in the nasal cavity. Septum intact  -  Nasopharynx - there was no evidence of mass or lesion.  -  Hypopharynx - There were no lesions in the pyriformis, epiglottis, or base of tongue.  -  Larynx -  there was mild interarytenoid edema, no erythema.  -  Vocal Folds -     Mobility: Right VF hypomobility   Amplitude: Symmetric bilaterally   Mucosal Wave: Mucosal wave intact bilaterally   Closure: Midcord gap   Lesions/Other findings: muscle tension dysphonia     Condition:  Stable.  Patient tolerated procedure well.    Complications:  None    MBS: 08/29/17:   Assessment: Pt presents with a mild pharyngeal dysphagia c/b reduced BOT retraction which led to mild vallecula  residue (all consistencies). Trace post-cricoid residue of solids. Trace penetration x 1 of thin liquids before swallows (cleared). No aspiration observed. Cervical esophagus: mild accumulation of boluses at tissue protrusion (~C6) c/w the appearance of a small Zenker's diverticulum. Occasional mild backflow of boluses from this area.     05/15/18:   Assessment: Pt presents with a mild pharyngeal dysphagia c/b reduced BOT retraction and decreased pharyngeal constriction. These led to vallecula residue (mild - thin liquid/puree; moderate - solid) and moderate solid residue along the posterior pharyngeal wall into the post-cricoid area. No aspiration or penetration observed. Cervical esophagus: mild accumulation of boluses at tissue protrusion (~C6) where there was a previously identified small Zenker's diverticulum for which pt is s/p endoscopic co2 laser Zenker diverticulotomy and CP botox injection. Minimal accumulation with thin liquids/puree and moderate amount with solids. Mild backflow of solids. Pt required multiple swallows and liquid rinse to adequately reduce and assist in clearing solid residue.    ??  PO Recommendations : Regular Solids (no restrictions), Thin liquids  ??  Recommended Compensatory Techniques : Small sips/bites, Alternate liquids and solids, Upright 90 degrees, Slow rate(Moisten solids PRN)  ??  independently reviewed. Reduction in bolus accumulation in zenker and min residual ZD.    Office Procedure: Percutaneous superior laryngeal nerve block      09/24/2018     Surgeon: Nira Retort. Sherryll Burger, M.D.    Assistant:     Pre-op Diagnosis:   1. Laryngeal spasm  2. Cough   3. Abnormality vagus nerve       Post-Op Diagnosis:   same      Anesthesia:  None     Procedures:     Percutaneous superior laryngeal nerve block (CPT: [phone])     The location of the right SLN was identified anatomically. 1% with 1:100,000 lido with epi used for local anesthesia. A 25 gauge needle was advanced through the overlying skin and the resistance of the thyrohyoid membrane was palpated.  Approximately 1.0 ml of decadron 10mg  1:1 with 2% lidocaine was  infiltrated in to the surrounding tissue . Thereafter the needle was withdrawn and the site observed for evidence of bleeding  None was detected      The patient tolerated the procedure well     At the conclusion of the procedure the patient was observed and recovered in the procedure room and was dicharged in stable condition.e.  No respiratory distress nor complaints were noted.      Findings:   1.Normal anatomy   At the conclusion of the procedure the patient was observed and recovered in the procedure room and was dicharged in stable condition..  No respiratory distress nor complaints were noted.      Estimated Blood Loss (in mL): <35ml    Complications: None     Disposition: stable    Attending Notes:     I performed the entire procedure          Assessment:   [name] is a 68 y.o. 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  He  has a past medical history of Atrial fibrillation (CMS-HCC), Basal cell carcinoma, Cancer (CMS-HCC), Cirrhosis (CMS-HCC), Depression, Diabetes (CMS-HCC), Headache, Hepatitis C (07/17/2012), History of transfusion, Liver disease, Low back pain (07/17/2012), and Varices, esophageal (CMS-HCC).    Findings:   dypsphonia  right vocal fold immobility   Glottic insufficiency   --all  temporally related to liver transplant transplant. (prolonged intubation, trach possible etiologies)   05/31/17 Status post in office injection augmentation with Cymetra in the [facility]   Significantly complicated history surrounding injection including hemothorax and anemia prior to injection followed by readmission x2 for fall rib pain and intubation for ERCP.  S/p [facility] on 09/13/17 for in office [facility] injection TTH cymetra 0.94ml.   Small Zenker diverticulum  S/p endoscopic co2 laser zenker diverticulotomy and botox injection CP 04/04/18     Plan:  Significant discussion was had with the patient today. He has some limited motion to the right vocal fold. He has a small gap but is quite breathy. He was evaluated by speech again today. There is an additional functional voice component on top of his paresis. We discussed pursuing right thyroplasty, possible goretex vs. Silastic. He may require some unloading by our SLP in OR. We will recommend postop voice therapy.   SLN block performed on right today to see if will reduce cough.   Evaluation of MBS shows appropriate tx of Zenker. He additionally has mild pharyngeal dysphagia. It may be that thyroplasty assists with cough and swallow.   He is now off coumadin. Intermittent low platelets will assess preop.

## 2018-09-24 NOTE — Unmapped (Signed)
Flexible endoscope serial number  [id number]  used today by Rupali Shah, MD

## 2018-09-25 NOTE — Unmapped (Signed)
[facility] SPEECH [address]  OUTPATIENT SPEECH PATHOLOGY  09/24/2018      Patient Name: Jeffrey Ward  Date of Birth:[birth year]  Session Number: 1  Diagnosis:   Encounter Diagnosis   Name Primary?   ??? Dysphonia         Date of Evaluation: 09/24/18  Date of Symptom Onset: 09/15/16  Referred by: Martina Sinner, MD   Reason for Referral: Evaluation Speech, Language, Voice, Cognition     ASSESSMENT:  Patient presents with severe dysphonia    Characterized by: breathiness, hoarseness, pharyngeal focus, strained voice, roughness, decreased loudness, weak voice  Therapy Techniques utilized during trial/diagnostic therapy:: Resonant voice therapy, Yawn-sigh  Comments: Patient to pursue thyroplasty with Dr. Sherryll Burger with possibility of voice therapy afterwards.    Stimulability: Pt was somewhat stimulable     Treatment Recommendations: Initiate Treatment      PLAN:  SLP Follow-up / Frequency: 1x week for Planned Treatment Duration : 8 sessions    Planned Interventions: Resonant Voice Therapy, Vocal Function Exercises, Diaphragmatic breathing/respiratory retraining, Conversational Coaching       Prognosis:  Good    Negative Prognosis Rationale: Time post onset, Severity of deficits       Positive Prognosis Rationale: Previous tx with benefit, Motivation      Goals:  Patient and Family Goals: To be heard (loudness). To lower pitch.     STG 1: Breathing: Pt will demonstrate abdominal breathing for support of phonation with 80% accuracy given minimal cues    Time Frame: 3  Duration: months    STG 2: Resonance: Pt will balance oral/nasal resonance to maximize vocal output with minimal effort/laryngeal hyperfunction with 80% accuracy given minimal cues                  STG 3: Strength/balance: Pt will strengthen and balance laryngeal musculature for support of connected speech with 80% accuracy given minimal cues                 STG 4: Inflection: Pt. will increase inflection, improve phonatory flexibility, and increase laryngeal control without laryngeal hyperfunction with 80% accuracy given minimal cues      LTG #1: At the completion of Tx, Pt will demonstrate ability to perform voice therapy techniques independently to maintain optimal laryngeal function (+/-)        Time Frame: 3  Duration: months      SUBJECTIVE:  Recall, 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 He has a past medical history of Atrial fibrillation (CMS-HCC), Basal cell carcinoma, Cancer (CMS-HCC), Cirrhosis (CMS-HCC), Depression, Diabetes (CMS-HCC), Headache, Hepatitis C (07/17/2012), History of transfusion, Liver disease, Low back pain (07/17/2012), and Varices, esophageal (CMS-HCC). He spent 3 months in the hospital following his liver transplant where he had respiratory compromise and consequent extended tracheostomy tube during his stay. He was decannulated in September prior to discharge. 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 Post-surgery, patient reported that he lost his voice and that it was breathy and weak. He could not control his pitch, and his voice symptoms did not fluctuate throughout the day, with no apparent ameliorating or exacerbating factors. He underwent in-office MPR injection TTH cymetra on right fold in 05/31/17 and experienced multiple set-backs in his GI care since then. He had a fall and possible liver abscess undergoing intubation and ERCP in April 2019 with  2 hospital admissions. He had another cymetra injection on 09/13/17 and returned with reports that his voice improved only by about 50%. He stated the voice was louder and easier, but still weak. In 04/04/2018, patient presented to the OR for endoscopic CO2 laser zenker diverticulotomy and botox CP. 09/24/2018 ??? [name] is seen in consultation at the request of Martina Sinner, MD for pre-operative measures of the voice. He explains that he is not heard well by others and has a hard time talking on the phone. He also comments that his speech seems higher than normal. He experiences shortness of breath almost every time he talks, especially when speaking for a longer period of time. No pain reported. Patient has previously benefited from voice therapy.    Communication Preference: Verbal         Barriers to Learning: No Barriers                              Prior treatment for referral reason: Yes   Comments: 2 sessions of voice therapy in 2019 at [facility]; Patient noted benefit                Pain?: No                 Lives With: Spouse    Past Medical History:   Diagnosis Date   ??? Atrial fibrillation (CMS-HCC)    ??? Basal cell carcinoma    ??? Cancer (CMS-HCC)    ??? Cirrhosis (CMS-HCC)    ??? Depression    ??? Diabetes (CMS-HCC)    ??? Headache    ??? Hepatitis C 07/17/2012   ??? History of transfusion    ??? Liver disease    ??? Low back pain 07/17/2012   ??? Varices, esophageal (CMS-HCC)       Family History   Problem Relation Age of Onset   ??? Hypertension Mother    ??? Cancer Mother    ??? Heart disease Mother    ??? Heart disease Father    ??? Cirrhosis Neg Hx    ??? Liver cancer Neg Hx    ??? Anemia Neg Hx    ??? Diabetes Neg Hx    ??? Kidney disease Neg Hx    ??? Obesity Neg Hx    ??? Thyroid disease Neg Hx    ??? Osteoporosis Neg Hx    ??? Coronary artery disease Neg Hx    ??? Anesthesia problems Neg Hx    ??? Basal cell carcinoma Neg Hx    ??? Squamous cell carcinoma Neg Hx      Past Surgical History:   Procedure Laterality Date   ??? ANKLE SURGERY     ??? BACK SURGERY     ??? BACK SURGERY     ??? CARDIAC SURGERY      pacemaker   ??? CHG US GUIDE, TISSUE ABLATION N/A 01/22/2016    Procedure: ULTRASOUND GUIDANCE FOR, AND MONITORING OF, PARENCHYMAL TISSUE ABLATION;  Surgeon: Particia Nearing, MD;  Location: MAIN OR [facility];  Service: Transplant   ??? IR EMBOLIZATION ORGAN ISCHEMIA, TUMORS, INFAR  06/16/2016    IR EMBOLIZATION ORGAN ISCHEMIA, TUMORS, INFAR 06/16/2016 Ammie Dalton, MD IMG VIR H&V [facility]   ??? PR COLON CA SCRN NOT HI RSK IND N/A 02/27/2015    Procedure: COLOREC CNCR SCR;COLNSCPY NO;  Surgeon: Vonda Antigua, MD;  Location: [facility]URES [facility];  Service: Gastroenterology   ???  PR ENDOSCOPIC ULTRASOUND EXAM N/A 02/27/2015    Procedure: UGI ENDO; W/ENDO ULTRASOUND EXAM INCLUDES ESOPHAGUS, STOMACH, &/OR DUODENUM/JEJUNUM;  Surgeon: Vonda Antigua, MD;  Location: [facility] PROC[facility]URES [facility];  Service: Gastroenterology   ??? PR ERCP BALLOON DILATE BILIARY/PANC DUCT/AMPULLA EA N/A 02/10/2017    Procedure: ERCP;WITH TRANS-ENDOSCOPIC BALLOON DILATION OF BILIARY/PANCREATIC DUCT(S) OR OF AMPULLA, INCLUDING SPHINCTERECTOMY, WHEN PERFOREMD,EACH DUCT (16109);  Surgeon: Mayford Knife, MD;  Location: GI PROCEDURES [facility];  Service: Gastroenterology   ??? PR ERCP REMOVE FOREIGN BODY/STENT BILIARY/PANC DUCT N/A 02/10/2017    Procedure: ENDOSCOPIC RETROGRADE CHOLANGIOPANCREATOGRAPHY (ERCP); W/ REMOVAL OF FOREIGN BODY/STENT FROM BILIARY/PANCREATIC DUCT(S);  Surgeon: Mayford Knife, MD;  Location: GI PROCEDURES [facility]IAL [facility];  Service: Gastroenterology   ??? PR ERCP REMOVE FOREIGN BODY/STENT BILIARY/PANC DUCT N/A 06/29/2017    Procedure: ENDOSCOPIC RETROGRADE CHOLANGIOPANCREATOGRAPHY (ERCP); W/ REMOVAL OF FOREIGN BODY/STENT FROM BILIARY/PANCREATIC DUCT(S);  Surgeon: Vonda Antigua, MD;  Location: GI PROCEDURES [facility];  Service: Gastroenterology   ??? PR ERCP STENT PLACEMENT BILIARY/PANCREATIC DUCT N/A 11/29/2016    Procedure: ENDOSCOPIC RETROGRADE CHOLANGIOPANCREATOGRAPHY (ERCP); WITH PLACEMENT OF ENDOSCOPIC STENT INTO BILIARY OR PANCREATIC DUCT;  Surgeon: Chriss Driver, MD;  Location: GI PROCEDURES [facility]OR[facility];  Service: Gastroenterology   ??? PR ERCP STENT PLACEMENT BILIARY/PANCREATIC DUCT N/A 04/26/2017    Procedure: ENDOSCOPIC RETROGRADE CHOLANGIOPANCREATOGRAPHY (ERCP); WITH PLACEMENT OF ENDOSCOPIC STENT INTO BILIARY OR PANCREATIC DUCT;  Surgeon: Mayford Knife, MD;  Location: GI PROCEDURES MEMORIAL [facility];  Service: Gastroenterology   ??? PR ERCP,W/REMOVAL STONE,BIL/PANCR DUCTS N/A 11/29/2016    Procedure: ERCP; W/ENDOSCOPIC RETROGRADE REMOVAL OF CALCULUS/CALCULI FROM BILIARY &/OR PANCREATIC DUCTS;  Surgeon: Chriss Driver, MD;  Location: GI PROCEDURES [facility];  Service: Gastroenterology   ??? PR ERCP,W/REMOVAL STONE,BIL/PANCR DUCTS N/A 02/10/2017    Procedure: ERCP; W/ENDOSCOPIC RETROGRADE REMOVAL OF CALCULUS/CALCULI FROM BILIARY &/OR PANCREATIC DUCTS;  Surgeon: Mayford Knife, MD;  Location: GI PROCEDURES [facility]OR[facility];  Service: Gastroenterology   ??? PR ERCP,W/REMOVAL STONE,BIL/PANCR DUCTS N/A 04/26/2017    Procedure: ERCP; W/ENDOSCOPIC RETROGRADE REMOVAL OF CALCULUS/CALCULI FROM BILIARY &/OR PANCREATIC DUCTS;  Surgeon: Mayford Knife, MD;  Location: GI PROCEDURES [facility]ORIAL [facility];  Service: Gastroenterology   ??? PR ERCP,W/REMOVAL STONE,BIL/PANCR DUCTS N/A 06/29/2017    Procedure: ERCP; W/ENDOSCOPIC RETROGRADE REMOVAL OF CALCULUS/CALCULI FROM BILIARY &/OR PANCREATIC DUCTS;  Surgeon: Vonda Antigua, MD;  Location: GI PROCEDURES MEMORIAL [facility];  Service: Gastroenterology   ??? PR ESOPHAGOSCP RIG TRANSORAL HYPOPHARYNX CRV ESOPH N/A 04/04/2018    Procedure: ESOPHAGOSCOPY, RIGID, TRANSORAL WITH DIVERTICULECTOMY OF HYPOPHARYNX OR CERVICAL ESOPHAGUS, WITH CRICOPHARYNGEAL MYOTOMY, INCLUDES USE OF TELESCOPE OR OPERATING MICROSCOPE AND REPAIR, WHEN PERFORMED;  Surgeon: Vista Deck, MD;  Location: MAIN OR [facility];  Service: ENT   ??? PR INSER HEART TEMP PACER ONE CHMBR N/A 10/02/2016    Procedure: Tempoarary Pacemaker Insertion;  Surgeon: Meredith Leeds, MD;  Location: [facility];  Service: Cardiology   ??? PR LAP,DIAGNOSTIC ABDOMEN N/A 01/22/2016    Procedure: Laparoscopy, Abdomen, Peritoneum, & Omentum, Diagnostic, W/Wo Collection Specimen(S) By Brushing Or Washing;  Surgeon: Particia Nearing, MD;  Location: MAIN OR [facility];  Service: Transplant   ??? PR PLACE PERCUT GASTROSTOMY TUBE N/A 11/17/2016 Procedure: UGI ENDO; W/DIRECTED PLCMT PERQ GASTROSTOMY TUBE;  Surgeon: Cletis Athens, MD;  Location: GI PROCEDURES [facility];  Service: Gastroenterology   ??? PR TRACHEOSTOMY, PLANNED N/A 09/29/2016    Procedure: TRACHEOSTOMY PLANNED (SEPART PROC);  Surgeon: Katherina Mires, MD;  Location: MAIN OR [facility];  Service: Trauma   ???  PR TRANSCATH INSERT OR REPLACE LEADLESS PM VENTR N/A 10/11/2016    Procedure: Pacemaker Implant/Replace Leadless;  Surgeon: Meredith Leeds, MD;  Location: [facility];  Service: Cardiology   ??? PR TRANSPLANT LIVER,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    Procedure: Liver Allotransplantation; Orthotopic, Partial Or Whole, From Cadaver Or Living Donor, Any Age;  Surgeon: Doyce Loose, MD;  Location: MAIN OR [facility];  Service: Transplant   ??? PR TRANSPLANT,PREP DONOR LIVER, WHOLE N/A 09/15/2016    Procedure: Rogelia Boga Std Prep Cad Donor Whole Liver Gft Prior Tnsplnt,Inc Chole,Diss/Rem Surr Tissu Wo Triseg/Lobe Splt;  Surgeon: Doyce Loose, MD;  Location: MAIN OR [facility];  Service: Transplant   ??? PR UPPER GI ENDOSCOPY,BIOPSY N/A 07/17/2012    Procedure: UGI ENDOSCOPY; WITH BIOPSY, SINGLE OR MULTIPLE;  Surgeon: Alba Destine, MD;  Location: GI PROCEDURES [facility]IAL [facility];  Service: Gastroenterology   ??? PR UPPER GI ENDOSCOPY,DIAGNOSIS N/A 02/04/2014    Procedure: UGI ENDO, INCLUDE ESOPHAGUS, STOMACH, & DUODENUM &/OR JEJUNUM; DX W/WO COLLECTION SPECIMN, BY BRUSH OR WASH;  Surgeon: Wilburt Finlay, MD;  Location: GI PROCEDURES MEMORIAL [facility]  Service: Gastroenterology   ??? PR UPPER GI ENDOSCOPY,LIGAT VARIX N/A 11/05/2013    Procedure: UGI ENDO; W/BAND LIG ESOPH &/OR GASTRIC VARICES;  Surgeon: Wilburt Finlay, MD;  Location: GI PROCEDURES [facility]ORIAL [facility];  Service: Gastroenterology    No Known Allergies  Social History     Tobacco Use   ??? Smoking status: Never Smoker   ??? Smokeless tobacco: Never Used   ??? Tobacco comment: Smoked in high school for about 5 years.    Substance Use Topics   ??? Alcohol use: No      Current Outpatient Medications   Medication Sig Dispense Refill   ??? alendronate (FOSAMAX) 70 MG tablet Take 1 tablet (70 mg total) by mouth every seven (7) days. 4 tablet 11   ??? bisacodyl (DULCOLAX, BISACODYL,) 5 mg EC tablet Take 5 mg by mouth daily as needed.      ??? blood sugar diagnostic Strp Use three (3) times a day before meals. 100 each 7   ??? blood-glucose meter (GLUCOSE MONITORING KIT) kit Use as instructed 1 each 0   ??? calcium carbonate (OS-CAL) 500 mg calcium (1,250 mg) chewable tablet Chew 2 tablets Two (2) times a day.      ??? cycloSPORINE modified (NEORAL) 100 MG capsule Take 2 capsules (200 mg) by mouth in the morning and 1 capsule in the evening with three 25mg  capsules (For a total daily dose of 200mg  each morning and 175mg  every evening) 90 each 11   ??? cycloSPORINE modified (NEORAL) 25 MG capsule Take 3 capsules (75mg ) by mouth every evening with a 100mg  capsule every evening (For a total dose of 175mg  every evening) 90 each 11   ??? ferrous sulfate 325 (65 FE) MG tablet Take 325 mg by mouth.     ??? insulin ASPART (NOVOLOG FLEXPEN U-100 INSULIN) 100 unit/mL (3 mL) injection pen Inject 0.04 mL (4 Units total) under the skin Three (3) times a day with meals. Add extra units if sugar is high, max daily dose 30 units. 15 mL 0   ??? insulin glargine (BASAGLAR KWIKPEN U-100 INSULIN) 100 unit/mL (3 mL) injection pen Inject 0.12 mL (12 Units total) under the skin nightly. 15 mL 0   ??? magnesium oxide-Mg AA chelate (MAGNESIUM, AMINO ACID CHELATE,) 133 mg Tab Take 2 tablets by mouth Two (2) times a day. 100 tablet PRN   ??? melatonin  3 mg Tab Take 1 tablet (3 mg total) by mouth nightly. 30 tablet 0   ??? mirtazapine (REMERON) 15 MG tablet TAKE 1 TABLET BY MOUTH NIGHTLY 90 tablet 3   ??? mycophenolate (CELLCEPT) 250 mg capsule TAKE 2 CAPSULES (500MG ) BY MOUTH TWICE DAILY 120 capsule 7   ??? nitrofurantoin, macrocrystal-monohydrate, (MACROBID) 100 MG capsule Take 1 capsule (100 mg total) by mouth twice daily until gone. 13 capsule 0   ??? pen needle, diabetic 32 gauge x 5/32 Ndle USE WITH INSULIN PEN 4 TIMES DAILY AS DIRECTED 100 each 1   ??? pregabalin (LYRICA) 75 MG capsule Take 1 capsule (75 mg total) by mouth 2 (two) times daily 60 capsule 6   ??? sildenafiL, pulm.hypertension, (REVATIO) 20 mg tablet TAKE 2 TABLETS (40 MG TOTAL) BY G-TUBE THREE TIMES DAILY 180 tablet 6     No current facility-administered medications for this visit.      OBJECTIVE  Voice - General   Hydration: 3-4 big bottles of water  Caffeine Use: 1 cup of coffee in the morning  Onset: Sudden  Daily Voice Use: Minimal demand   Acoustic Measures  Connected Sample: CAPE-V Sentences  Connected Sample 1 (Hz): 188.51 Hz   Total Fundamental Frequency  Total Fundamental Frequency Range (Hz): 128 Hz  to (Hz): 299 Hz   Sustained Phonation  Mean Fund Freq (Hz): 182.88 Hz  Relative Average Perturbation (RAP) %: 4.24 %  Noise-to-Harmonic Ratio (NHR): 0.44  Degree of Subharmonics (DSH) %: 4.55 %  Degree of Voiceless (DUV) %: 68.12 %  Soft Phonation Index (SPI) : 9.63   Aerodynamic Measures  Maximum phonation time on /a/ in sec:: 2 sec  Mean Flow Rate (mL/sec): 930 mL/sec  at (Hz): 169.1 Hz  and (dB): 80.69 dB  Objective and/or perceptual measures suggest:: Weak voice, Hyperfunctional pattern  Pulmonary Breath Support: SOB  Primary Breath Support: Diaphragmatic, Thoracic  Speaks on functional residual capacity?: No       Perceptual Measures  Grade:: 3  Breathy:: 3  Strained:: 2  Rough:: 2  Asthenic:: 2  Total:: 12 /15   Tremor / Rate of Speech  Tremor:: Absent  Rate of Speech:: WFL   Subjective Measures  Glottal Function Index (GFI):: 15  Voice-related Quality of Life (V-RQOL):: 20    Session Duration : 30    Today's Charges (noted here with $$):     SLP Evaluations  $$ Voice Quality Evaluation [mins]: 15  $$ Laryngeal Function Study - Voice Ctr only [mins]: 15    I was physically present and immediately available to direct and supervise tasks that were related to patient management by Graduate Student Intern, Alita Chyle, BA. The direction and supervision was continuous throughout the time these tasks were performed.    Patient wore mask for majority of session; doffed mask for laryngeal airflow measure.    I attest that I have reviewed the above information.  Signed: Patricia Nettle, SLP  09/24/2018 9:45 AM

## 2018-09-26 LAB — CBC W/ DIFFERENTIAL
BASOPHILS ABSOLUTE COUNT: 0 10*9/L
BASOPHILS ABSOLUTE COUNT: 0 10*9/L
EOSINOPHILS ABSOLUTE COUNT: 0 10*9/L
EOSINOPHILS ABSOLUTE COUNT: 0.1 10*9/L
HEMATOCRIT: 34.4 % — ABNORMAL LOW
HEMOGLOBIN: 11.8 g/dL — ABNORMAL LOW
HEMOGLOBIN: 12.5 g/dL — ABNORMAL LOW
LYMPHOCYTES ABSOLUTE COUNT: 1 10*9/L
LYMPHOCYTES ABSOLUTE COUNT: 1.4 10*9/L
MONOCYTES ABSOLUTE COUNT: 0.3 10*9/L
MONOCYTES ABSOLUTE COUNT: 0.6 10*9/L
NEUTROPHILS ABSOLUTE COUNT: 2.9 10*9/L
NEUTROPHILS ABSOLUTE COUNT: 4.7 10*9/L
PLATELET COUNT: 75 10*9/L — ABNORMAL LOW
PLATELET COUNT: 97 10*9/L — ABNORMAL LOW
WBC ADJUSTED: 6.9 10*9/L
WHITE BLOOD CELL COUNT: 4.2 10*9/L

## 2018-09-26 LAB — COMPREHENSIVE METABOLIC PANEL
ALT (SGPT): 7 U/L
ALT (SGPT): 14 U/L
AST (SGOT): 16 U/L
BILIRUBIN TOTAL: 0.6 mg/dL
BILIRUBIN TOTAL: 0.8 mg/dL
BLOOD UREA NITROGEN: 41 mg/dL — ABNORMAL HIGH
BLOOD UREA NITROGEN: 34 mg/dL — ABNORMAL HIGH
CALCIUM: 9.2 mg/dL
CALCIUM: 10.1 mg/dL
CHLORIDE: 106 mmol/L
CHLORIDE: 106 mmol/L
CO2: 22.3 mmol/L
CO2: 254 mmol/L
CREATININE: 1.9 mg/dL — ABNORMAL HIGH
CREATININE: 2.1 mg/dL — ABNORMAL HIGH
GLUCOSE RANDOM: 227 mg/dL — ABNORMAL HIGH
POTASSIUM: 4.6 mmol/L
POTASSIUM: 4.3 mmol/L
PROTEIN TOTAL: 5.9 g/dL — ABNORMAL LOW
PROTEIN TOTAL: 6.8 g/dL
SODIUM: 136 mmol/L
SODIUM: 139 mmol/L

## 2018-09-26 LAB — MONOCYTES RELATIVE PERCENT: Lab: 0

## 2018-09-26 LAB — URINALYSIS
GLUCOSE UA: 250 — AB
PH UA: 5.5
SPECIFIC GRAVITY UA: 1.025

## 2018-09-26 LAB — ALBUMIN
Lab: 4
Lab: 4.3

## 2018-09-26 LAB — CHLORIDE: Lab: 106

## 2018-09-26 LAB — CREATININE: Lab: 1.9 — ABNORMAL HIGH

## 2018-09-26 LAB — EOSINOPHILS ABSOLUTE COUNT: Lab: 0

## 2018-09-26 LAB — UROBILINOGEN UA: Lab: 0

## 2018-09-26 MED FILL — MG-PLUS-PROTEIN 133 MG TABLET: ORAL | 25 days supply | Qty: 100 | Fill #6

## 2018-09-26 MED FILL — MG-PLUS-PROTEIN 133 MG TABLET: 25 days supply | Qty: 100 | Fill #6 | Status: AC

## 2018-09-26 MED FILL — MIRTAZAPINE 15 MG TABLET: 90 days supply | Qty: 90 | Fill #0 | Status: AC

## 2018-09-26 NOTE — Unmapped (Addendum)
[employer] PAP form for Novolog Flextouch received; [employer] PAP form for [employer] received, refer to [employer]. Please let me know if forms can be signed by provider if able to receive at [facility].    Forms placed in your box

## 2018-09-26 NOTE — Unmapped (Signed)
Spoke with patient and suggested changing from Novolog to Humalog so that way both Humalog and Basaglar can be shipped directly to his home address. He was agreeable with this plan. I also suggested that he does not have to work with the Prescription Assistance 123 Program as our office can work with him directly. Please see separate telephone encounter with details for patient assistance application for Humalog and Basaglar.    Sara--this was perfect! Scanning them to the chart is exactly what I needed!

## 2018-09-26 NOTE — Unmapped (Signed)
Received VM from wife asking if patient should get labs. Called back letting her know every 6-8 weeks I need labs.  Sent copy of lab order to patient and PCP.  Educated about the need for a reliable cyclosporine drug trough.  Reviewed PCP labs from this week and creatinine now up to 2.1.  Educated about the need for drinking 80-100 ounces of water a day and wife agreed and said they will get labs drawn on Monday.  Since no cyclosporine level was drawn on 09/25/2018 at PCP.

## 2018-09-27 MED ORDER — BASAGLAR KWIKPEN U-100 INSULIN 100 UNIT/ML (3 ML) SUBCUTANEOUS
Freq: Every evening | SUBCUTANEOUS | 12 refills | 125.00000 days | Status: CP
Start: 2018-09-27 — End: 2019-09-27

## 2018-09-27 MED ORDER — INSULIN LISPRO (U-100) 100 UNIT/ML SUBCUTANEOUS PEN
Freq: Three times a day (TID) | SUBCUTANEOUS | 12 refills | 125.00000 days | Status: CP
Start: 2018-09-27 — End: ?

## 2018-09-27 NOTE — Unmapped (Signed)
Manufacturer Assistance Program    Medication:   Barrister's clerk and Humalog Theme park manager:   Barrister's clerk number: [phone] Jeffrey Ward)    Medication Shipment Location:   Patient's home    Patient Portion of Application:   Sent to patient to complete via mail.    Provider Portion of Application:   Has been completed and sent directly to patient to include in application submission. Copy to be uploaded to Media tab.    Prescriptions:  Rxs for [employer] are processed by [facility]. Phone: [phone]. Fax: [phone]. Rxs have been faxed to [facility] on 09/27/18.    Approval:   Pending    Refill Process:   Provider office must fax in [facility] Fax Refill Form available at: [email or web address]

## 2018-09-27 NOTE — Unmapped (Signed)
Addended by: Alexis Goodell B on: 09/27/2018 09:36 AM     Modules accepted: Orders

## 2018-10-01 ENCOUNTER — Ambulatory Visit: Admit: 2018-10-01 | Discharge: 2018-10-02 | Payer: MEDICARE

## 2018-10-01 DIAGNOSIS — Z79899 Other long term (current) drug therapy: Secondary | ICD-10-CM

## 2018-10-01 DIAGNOSIS — Z944 Liver transplant status: Secondary | ICD-10-CM

## 2018-10-01 DIAGNOSIS — D899 Disorder involving the immune mechanism, unspecified: Secondary | ICD-10-CM

## 2018-10-01 LAB — MAGNESIUM: Magnesium:MCnc:Pt:Ser/Plas:Qn:: 1.7

## 2018-10-01 LAB — COMPREHENSIVE METABOLIC PANEL
ALBUMIN: 3.7 g/dL (ref 3.5–5.0)
ALKALINE PHOSPHATASE: 83 U/L (ref 38–126)
ALT (SGPT): 13 U/L (ref <50)
ANION GAP: 9 mmol/L (ref 7–15)
BILIRUBIN TOTAL: 0.7 mg/dL (ref 0.0–1.2)
BLOOD UREA NITROGEN: 37 mg/dL — ABNORMAL HIGH (ref 7–21)
CALCIUM: 9.7 mg/dL (ref 8.5–10.2)
CHLORIDE: 104 mmol/L (ref 98–107)
CO2: 21 mmol/L — ABNORMAL LOW (ref 22.0–30.0)
CREATININE: 2.03 mg/dL — ABNORMAL HIGH (ref 0.70–1.30)
EGFR CKD-EPI AA MALE: 37 mL/min/{1.73_m2} — ABNORMAL LOW (ref >=60)
EGFR CKD-EPI NON-AA MALE: 32 mL/min/{1.73_m2} — ABNORMAL LOW (ref >=60)
GLUCOSE RANDOM: 130 mg/dL (ref 70–179)
POTASSIUM: 5 mmol/L (ref 3.5–5.0)
PROTEIN TOTAL: 6.5 g/dL (ref 6.5–8.3)
SODIUM: 134 mmol/L — ABNORMAL LOW (ref 135–145)

## 2018-10-01 LAB — CBC W/ AUTO DIFF
BASOPHILS ABSOLUTE COUNT: 0 10*9/L (ref 0.0–0.1)
BASOPHILS RELATIVE PERCENT: 0.4 %
EOSINOPHILS ABSOLUTE COUNT: 0.1 10*9/L (ref 0.0–0.4)
EOSINOPHILS RELATIVE PERCENT: 1.8 %
HEMATOCRIT: 36 % — ABNORMAL LOW (ref 41.0–53.0)
HEMOGLOBIN: 12.3 g/dL — ABNORMAL LOW (ref 13.5–17.5)
LYMPHOCYTES ABSOLUTE COUNT: 1 10*9/L — ABNORMAL LOW (ref 1.5–5.0)
LYMPHOCYTES RELATIVE PERCENT: 24.4 %
MEAN CORPUSCULAR HEMOGLOBIN CONC: 34.2 g/dL (ref 31.0–37.0)
MEAN CORPUSCULAR HEMOGLOBIN: 32.3 pg (ref 26.0–34.0)
MEAN CORPUSCULAR VOLUME: 94.3 fL (ref 80.0–100.0)
MEAN PLATELET VOLUME: 8.6 fL (ref 7.0–10.0)
MONOCYTES ABSOLUTE COUNT: 0.3 10*9/L (ref 0.2–0.8)
MONOCYTES RELATIVE PERCENT: 7.9 %
NEUTROPHILS ABSOLUTE COUNT: 2.6 10*9/L (ref 2.0–7.5)
NEUTROPHILS RELATIVE PERCENT: 63.3 %
RED CELL DISTRIBUTION WIDTH: 14 % (ref 12.0–15.0)
WBC ADJUSTED: 4.1 10*9/L — ABNORMAL LOW (ref 4.5–11.0)

## 2018-10-01 LAB — BILIRUBIN DIRECT: Bilirubin.glucuronidated:MCnc:Pt:Ser/Plas:Qn:: <0.1

## 2018-10-01 LAB — PHOSPHORUS: Phosphate:MCnc:Pt:Ser/Plas:Qn:: 3.5

## 2018-10-01 LAB — GAMMA GLUTAMYL TRANSFERASE: Gamma glutamyl transferase:CCnc:Pt:Ser/Plas:Qn:: 32

## 2018-10-01 LAB — BASOPHILS RELATIVE PERCENT: Lab: 0.4

## 2018-10-01 LAB — ALT (SGPT): Alanine aminotransferase:CCnc:Pt:Ser/Plas:Qn:: 13

## 2018-10-01 NOTE — Unmapped (Signed)
[facility] Clinical Assessment & Refill Coordination Note    Jeffrey Ward, DOB: 1950-07-04  Phone: [phone] (home)     All above HIPAA information was verified with patient's caregiver. Spoke to his wife [name] today about his medications.    Specialty Medication(s):   Transplant: mycophenolate mofetil 250mg , cyclosporine 25mg , cyclosporine 100mg  and Sidenafil 20mg      Current Outpatient Medications   Medication Sig Dispense Refill   ??? alendronate (FOSAMAX) 70 MG tablet Take 1 tablet (70 mg total) by mouth every seven (7) days. 4 tablet 11   ??? bisacodyl (DULCOLAX, BISACODYL,) 5 mg EC tablet Take 5 mg by mouth daily as needed.      ??? blood sugar diagnostic Strp Use three (3) times a day before meals. 100 each 7   ??? blood-glucose meter (GLUCOSE MONITORING KIT) kit Use as instructed 1 each 0   ??? calcium carbonate (OS-CAL) 500 mg calcium (1,250 mg) chewable tablet Chew 2 tablets Two (2) times a day.      ??? cycloSPORINE modified (NEORAL) 100 MG capsule Take 2 capsules (200 mg) by mouth in the morning and 1 capsule in the evening with three 25mg  capsules (For a total daily dose of 200mg  each morning and 175mg  every evening) 90 each 11   ??? cycloSPORINE modified (NEORAL) 25 MG capsule Take 3 capsules (75mg ) by mouth every evening with a 100mg  capsule every evening (For a total dose of 175mg  every evening) 90 each 11   ??? ferrous sulfate 325 (65 FE) MG tablet Take 325 mg by mouth.     ??? insulin glargine (BASAGLAR KWIKPEN U-100 INSULIN) 100 unit/mL (3 mL) injection pen Inject 0.12 mL (12 Units total) under the skin nightly. 15 mL 12   ??? insulin lispro (HUMALOG KWIKPEN INSULIN) 100 unit/mL injection pen Inject 4 Units under the skin Three (3) times a day before meals. 15 mL 12   ??? magnesium oxide-Mg AA chelate (MAGNESIUM, AMINO ACID CHELATE,) 133 mg Tab Take 2 tablets by mouth Two (2) times a day. 100 tablet PRN   ??? melatonin 3 mg Tab Take 1 tablet (3 mg total) by mouth nightly. 30 tablet 0 ??? mirtazapine (REMERON) 15 MG tablet TAKE 1 TABLET BY MOUTH NIGHTLY 90 tablet 3   ??? mycophenolate (CELLCEPT) 250 mg capsule TAKE 2 CAPSULES (500MG ) BY MOUTH TWICE DAILY 120 capsule 7   ??? nitrofurantoin, macrocrystal-monohydrate, (MACROBID) 100 MG capsule Take 1 capsule (100 mg total) by mouth twice daily until gone. 13 capsule 0   ??? pen needle, diabetic 32 gauge x 5/32 Ndle USE WITH INSULIN PEN 4 TIMES DAILY AS DIRECTED 100 each 1   ??? pregabalin (LYRICA) 75 MG capsule Take 1 capsule (75 mg total) by mouth 2 (two) times daily 60 capsule 6   ??? sildenafiL, pulm.hypertension, (REVATIO) 20 mg tablet TAKE 2 TABLETS (40 MG TOTAL) BY G-TUBE THREE TIMES DAILY 180 tablet 6     No current facility-administered medications for this visit.     21    Changes to medications: Lavontae reports no changes at this time.    No Known Allergies    Changes to allergies: No    SPECIALTY MEDICATION ADHERENCE     Cyclosporine 25 mg: 21 days of medicine on hand   Cyclosporine 100 mg: 21 days of medicine on hand   Mycophenolate 250 mg: 21 days of medicine on hand   Sildenafil 20 mg: 21 days of medicine on hand  Medication Adherence    Support network for adherence: family member          Specialty medication(s) dose(s) confirmed: Regimen is correct and unchanged.     Are there any concerns with adherence? No    Adherence counseling provided? Not needed    CLINICAL MANAGEMENT AND INTERVENTION      Clinical Benefit Assessment:    Do you feel the medicine is effective or helping your condition? Yes    Clinical Benefit counseling provided? Not needed    Adverse Effects Assessment:    Are you experiencing any side effects? No    Are you experiencing difficulty administering your medicine? No    Quality of Life Assessment:    How many days over the past month did your liver transplant  keep you from your normal activities? For example, brushing your teeth or getting up in the morning. 0    Have you discussed this with your provider? Not needed Therapy Appropriateness:    Is therapy appropriate? Yes, therapy is appropriate and should be continued    DISEASE/MEDICATION-SPECIFIC INFORMATION      N/A    PATIENT SPECIFIC NEEDS     ? Does the patient have any physical, cognitive, or cultural barriers? No    ? Is the patient high risk? Yes, patient taking a REMS drug     ? Does the patient require a Care Management Plan? No     ? Does the patient require physician intervention or other additional services (i.e. nutrition, smoking cessation, social work)? No      SHIPPING     Specialty Medication(s) to be Shipped:   Transplant: Patient currently has over 3 weeks of specialty medication on hand and does not need anthing at this time.    Other medication(s) to be shipped: NONE     Changes to insurance: No    Delivery Scheduled: Patient declined refill at this time due to Has more than 3 weeks of medication on hand..     Medication will be delivered via UPS to the confirmed home address in [facility].    The patient will receive a drug information handout for each medication shipped and additional FDA Medication Guides as required.  Verified that patient has previously received a Conservation officer, historic buildings.    All of the patient's questions and concerns have been addressed.    Tera Helper   [facility] Specialty Pharmacist

## 2018-10-02 LAB — CYCLOSPORINE, TROUGH: Lab: 88 — ABNORMAL LOW

## 2018-10-08 NOTE — Unmapped (Signed)
Patient called reporting his PCP wants him to see a nephrologist.  Patient requested I send a referral so he can see a [facility] nephrologist.  Per NP Martin-Velez  referral sent.

## 2018-10-18 NOTE — Unmapped (Signed)
[facility] Refill Coordination Note    Specialty Medication(s) to be Shipped:   Transplant: mycophenolate mofetil 250mg , cyclosporine 25mg  and cyclosporine 100mg   Other medication(s) to be shipped: Pregabalin 75mg , Mag Plus Protein 133mg , Sildenafil 20mg  & Alendronate 70mg       Jeffrey Ward, DOB: 1951-01-09  Phone: [phone] (home) [phone] (work)    All above HIPAA information was verified with patient.     Completed refill call assessment today to schedule patient's medication shipment from the [facility] [phone]).       Specialty medication(s) and dose(s) confirmed: Regimen is correct and unchanged.   Changes to medications: Tarez reports no changes at this time.  Changes to insurance: No  Questions for the pharmacist: No    Confirmed patient received Welcome Packet with first shipment. The patient will receive a drug information handout for each medication shipped and additional FDA Medication Guides as required.       DISEASE/MEDICATION-SPECIFIC INFORMATION        N/A    SPECIALTY MEDICATION ADHERENCE     Medication Adherence    Patient reported X missed doses in the last month: 0  Specialty Medication: Cyclosporine 25mg   Patient is on additional specialty medications: Yes  Additional Specialty Medications: Cyclosporine 100mg   Patient Reported Additional Medication X Missed Doses in the Last Month: 0  Patient is on more than two specialty medications: Yes  Specialty Medication: Mycophenolate 250mg   Patient Reported Additional Medication X Missed Doses in the Last Month: 0  Support network for adherence: family member        Cyclosporine 25 mg: 9 days of medicine on hand   Cyclosporine 100 mg: 9 days of medicine on hand   Mycophenolate 250 mg: 9 days of medicine on hand     SHIPPING     Shipping address confirmed in [employer].     Delivery Scheduled: Yes, Expected medication delivery date: 10/26/2018.     Medication will be delivered via UPS to the home address in [employer] [place].    [name] P [name]   [facility] Specialty Technician

## 2018-10-19 NOTE — Unmapped (Signed)
8/7  I left a voice mail to schedule for a new patient appointment. [employer]

## 2018-10-24 NOTE — Unmapped (Incomplete)
Pharmacist Telephone Encounter    Received faxed that [name] application for Basaglar and Humalog was denied due to him having insurance.     Called [employer] [phone]) for clarification and spoke with a representative who stated that the patient had marked that he had commercial insurance. Informed him that the patient had Medicare. He let me know that the application will be expedited and if any further issues arise, a fax will be sent over to the endocrinology clinic.    Quentin Mulling, PharmD  PGY2 Ambulatory Care Pharmacy Resident

## 2018-10-25 MED FILL — CYCLOSPORINE MODIFIED 100 MG CAPSULE: 30 days supply | Qty: 90 | Fill #4

## 2018-10-25 MED FILL — PREGABALIN 75 MG CAPSULE: 30 days supply | Qty: 60 | Fill #0

## 2018-10-25 MED FILL — PREGABALIN 75 MG CAPSULE: 30 days supply | Qty: 60 | Fill #0 | Status: AC

## 2018-10-25 MED FILL — MYCOPHENOLATE MOFETIL 250 MG CAPSULE: 30 days supply | Qty: 120 | Fill #6

## 2018-10-25 MED FILL — ALENDRONATE 70 MG TABLET: 28 days supply | Qty: 4 | Fill #7 | Status: AC

## 2018-10-25 MED FILL — MYCOPHENOLATE MOFETIL 250 MG CAPSULE: 30 days supply | Qty: 120 | Fill #6 | Status: AC

## 2018-10-25 MED FILL — SILDENAFIL (PULMONARY HYPERTENSION) 20 MG TABLET: 30 days supply | Qty: 180 | Fill #4

## 2018-10-25 MED FILL — MG-PLUS-PROTEIN 133 MG TABLET: ORAL | 25 days supply | Qty: 100 | Fill #7

## 2018-10-25 MED FILL — MG-PLUS-PROTEIN 133 MG TABLET: 25 days supply | Qty: 100 | Fill #7 | Status: AC

## 2018-10-25 MED FILL — CYCLOSPORINE MODIFIED 25 MG CAPSULE: 30 days supply | Qty: 90 | Fill #4

## 2018-10-25 MED FILL — SILDENAFIL (PULMONARY HYPERTENSION) 20 MG TABLET: 30 days supply | Qty: 180 | Fill #4 | Status: AC

## 2018-10-25 MED FILL — CYCLOSPORINE MODIFIED 100 MG CAPSULE: 30 days supply | Qty: 90 | Fill #4 | Status: AC

## 2018-10-25 MED FILL — ALENDRONATE 70 MG TABLET: ORAL | 28 days supply | Qty: 4 | Fill #7

## 2018-10-25 MED FILL — CYCLOSPORINE MODIFIED 25 MG CAPSULE: 30 days supply | Qty: 90 | Fill #4 | Status: AC

## 2018-10-26 NOTE — Unmapped (Signed)
[facility] pharmacy sent in form for Humalog. Where should this go, Dr. Ledon Snare?

## 2018-10-29 NOTE — Unmapped (Signed)
[employer] Humalog Form placed in your box.

## 2018-11-05 ENCOUNTER — Ambulatory Visit: Admit: 2018-11-05 | Discharge: 2018-11-05 | Payer: MEDICARE

## 2018-11-05 ENCOUNTER — Ambulatory Visit
Admit: 2018-11-05 | Discharge: 2018-11-05 | Payer: MEDICARE | Attending: Student in an Organized Health Care Education/Training Program | Primary: Student in an Organized Health Care Education/Training Program

## 2018-11-05 ENCOUNTER — Ambulatory Visit: Admit: 2018-11-05 | Discharge: 2018-11-05 | Payer: MEDICARE | Attending: Family | Primary: Family

## 2018-11-05 DIAGNOSIS — D684 Acquired coagulation factor deficiency: Secondary | ICD-10-CM

## 2018-11-05 DIAGNOSIS — Z944 Liver transplant status: Secondary | ICD-10-CM

## 2018-11-05 DIAGNOSIS — Z01818 Encounter for other preprocedural examination: Secondary | ICD-10-CM

## 2018-11-05 DIAGNOSIS — N189 Chronic kidney disease, unspecified: Secondary | ICD-10-CM

## 2018-11-05 DIAGNOSIS — D899 Disorder involving the immune mechanism, unspecified: Secondary | ICD-10-CM

## 2018-11-05 DIAGNOSIS — Z8505 Personal history of malignant neoplasm of liver: Secondary | ICD-10-CM

## 2018-11-05 DIAGNOSIS — E119 Type 2 diabetes mellitus without complications: Secondary | ICD-10-CM

## 2018-11-05 DIAGNOSIS — J3800 Paralysis of vocal cords and larynx, unspecified: Principal | ICD-10-CM

## 2018-11-05 DIAGNOSIS — I495 Sick sinus syndrome: Secondary | ICD-10-CM

## 2018-11-05 DIAGNOSIS — Z1159 Encounter for screening for other viral diseases: Principal | ICD-10-CM

## 2018-11-05 DIAGNOSIS — I4891 Unspecified atrial fibrillation: Secondary | ICD-10-CM

## 2018-11-05 DIAGNOSIS — K219 Gastro-esophageal reflux disease without esophagitis: Secondary | ICD-10-CM

## 2018-11-05 DIAGNOSIS — K225 Diverticulum of esophagus, acquired: Secondary | ICD-10-CM

## 2018-11-05 DIAGNOSIS — I272 Pulmonary hypertension, unspecified: Secondary | ICD-10-CM

## 2018-11-05 LAB — CBC
HEMOGLOBIN: 12.1 g/dL — ABNORMAL LOW (ref 13.5–17.5)
MEAN CORPUSCULAR HEMOGLOBIN CONC: 32.5 g/dL (ref 31.0–37.0)
MEAN CORPUSCULAR HEMOGLOBIN: 31 pg (ref 26.0–34.0)
MEAN CORPUSCULAR VOLUME: 95.3 fL (ref 80.0–100.0)
MEAN PLATELET VOLUME: 8.8 fL (ref 7.0–10.0)
PLATELET COUNT: 107 10*9/L — ABNORMAL LOW (ref 150–440)
RED CELL DISTRIBUTION WIDTH: 14.6 % (ref 12.0–15.0)
WBC ADJUSTED: 3.7 10*9/L — ABNORMAL LOW (ref 4.5–11.0)

## 2018-11-05 LAB — PROTIME-INR: INR: 1.01

## 2018-11-05 LAB — PLATELET COUNT: Platelets:NCnc:Pt:Bld:Qn:Automated count: 107 — ABNORMAL LOW

## 2018-11-05 LAB — HEPARIN CORRELATION: Lab: 0.2

## 2018-11-05 LAB — PROTIME: Lab: 11.6

## 2018-11-05 NOTE — Unmapped (Signed)
Otolaryngology     Reason for visit:  Follow up     History of Present Illness:     Recall, initial presentation on 01/30/17 [name] is a 68 y.o. 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 He spent 3 months in the hospital following his transplant he had respiratory compromise and ultimately had tracheostomy tube for much of that stay. He was decannulated in September before discharge home. 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  Patient reports he lost his voice following liver transplant episode 3 months ago. He states that since surgery his voice is breathy and weak. He cannot control his pitch. His voice symptoms do not fluctuate throughout the day and he has not noticed anything that makes it better or worse. He does have a cough throughout the day. He also endorses globus sensation. He denies dysphagia. He is maintained on an oral diet but has not had his G-tube removed yet due to surgeon preference. He has never had voice issues before and has never seen an ENT doctor for any related issues. He does endorse consistent shortness of breath with exertion and notes that this is likely secondary to his recovery process from his liver transplant. She does take Prilosec for his GERD. Thinks that this is stable. No coughing or choking while eating.    He was noted to have a right vocal fold paralysis with significant breathy voice and glottal insufficiency.   For multilpe reasons intervention was delayed.   He has had quite a complicated course.     He did have anemia and hemothorax.     He underwent in office injection cymetra on 05/31/17 0.45ml right fold.     Since then, he has had multiple set backs in his GI care. He had a fall and possible liver abscess. He underwent intubation and ERCP in April. He has been admitted to the hospital twice.     08/08/17: He returns today in follow-up.  He reports no significant improvement in the voice following injection.  He continues to be weak and breathy.  He is also had increasing coughing on foods and liquids.  No pneumonia.  He has had significant illnesses since his injection however as stated above.    He was taken back to [facility] on 09/13/17 for in office [facility] injection TTH cymetra 0.70ml.     10/03/17:   He returns after injection. The voice improved but by only about 50%. The voice has started to decline. He also complains of coughing while eating. This has not changed with injection augmentation. He does state he is louder and easier, but still weak from voice standpoint.    10.21.19:   Follows up for reassessment. Per wife, voice has remained stable since last visit. Improved compared to prior to injection but still with breathiness and vocal fatigue. He was unable to complete voice therpay. He had Hip fracture surgery recently and was intubated for this. Wife states still with some coughing after meals. No regurgitation after meals. Patient states imprpoved.     03/13/18: He comes in today for preop for voice surgery however he states his swallowing has become more of an issue.  He continues to get choked on solid foods and has to regurgitate to swallow things down.  He feels like this is getting worse and is currently a larger issue than the voice.     04/04/18: OR for endoscopic co2 laser zenker diverticulotomy and botox CP     04/17/18: he comes in today for postop. His postop swallow study showed no extravasation and was dishcarged home on pod 1 stable. He denies fevers, chest pain, tachycardia. He continues to have a cough.     09/24/18: he comes in today for follow up. Regarding his voice, he is still quite frustrated. It is weak and he has vocal fatigue. He is interested in permanent vocal rehab. Recall he is now off coumadin.     With regards to swallow, he is s/p endo co2 laser zenker diverticulotomy on 04/04/18. His wife still complains that he coughs when he eats. He also has dry cough when not eating. He is able to eat all consistencies of food. He had MBS on 05/15/18 that showed minimal residue in minimal residual zenker. He did have some backflow of solids and mild pharyngeal dysphagia.    11/05/18: he returns in fu. No improvement in cough with SLN injection. Still with breathy dysphonia. Here for preop for thyroplasty. No new issues. No URI, no chest pain.       Past Medical History:  Past Medical History:   Diagnosis Date   ??? Atrial fibrillation (CMS-HCC)    ??? Basal cell carcinoma    ??? Cancer (CMS-HCC)    ??? Cirrhosis (CMS-HCC)    ??? Depression    ??? Diabetes (CMS-HCC)    ??? Headache    ??? Hepatitis C 07/17/2012   ??? History of transfusion    ??? Liver disease    ??? Low back pain 07/17/2012   ??? Varices, esophageal (CMS-HCC)        Past Surgical History:  Past Surgical History:   Procedure Laterality Date   ??? ANKLE SURGERY     ??? BACK SURGERY     ??? BACK SURGERY     ??? CARDIAC SURGERY      pacemaker   ??? CHG US GUIDE, TISSUE ABLATION N/A 01/22/2016    Procedure: ULTRASOUND GUIDANCE FOR, AND MONITORING OF, PARENCHYMAL TISSUE ABLATION;  Surgeon: Particia Nearing, MD;  Location: MAIN OR [facility];  Service: Transplant   ??? IR EMBOLIZATION ORGAN ISCHEMIA, TUMORS, INFAR  06/16/2016    IR EMBOLIZATION ORGAN ISCHEMIA, TUMORS, INFAR 06/16/2016 Ammie Dalton, MD IMG VIR H&V [facility]   ??? PR COLON CA SCRN NOT HI RSK IND N/A 02/27/2015    Procedure: COLOREC CNCR SCR;COLNSCPY NO;  Surgeon: Vonda Antigua, MD;  Location: GI PROCEDURES MEM[facility];  Service: Gastroenterology   ??? PR ENDOSCOPIC ULTRASOUND EXAM N/A 02/27/2015    Procedure: UGI ENDO; W/ENDO ULTRASOUND EXAM INCLUDES ESOPHAGUS, STOMACH, &/OR DUODENUM/JEJUNUM;  Surgeon: Vonda Antigua, MD;  Location: GI PROCEDURES MEMORIAL [facility];  Service: Gastroenterology   ??? PR ERCP BALLOON DILATE BILIARY/PANC DUCT/AMPULLA EA N/A 02/10/2017    Procedure: ERCP;WITH TRANS-ENDOSCOPIC BALLOON DILATION OF BILIARY/PANCREATIC DUCT(S) OR OF AMPULLA, INCLUDING SPHINCTERECTOMY, WHEN PERFOREMD,EACH DUCT (16109);  Surgeon: Mayford Knife, MD;  Location: GI PROCEDURES [facility];  Service: Gastroenterology   ??? PR ERCP REMOVE FOREIGN BODY/STENT BILIARY/PANC DUCT N/A 02/10/2017    Procedure: ENDOSCOPIC RETROGRADE CHOLANGIOPANCREATOGRAPHY (ERCP); W/ REMOVAL OF FOREIGN BODY/STENT FROM BILIARY/PANCREATIC DUCT(S);  Surgeon: Mayford Knife, MD;  Location: GI PROCEDURES MEMORIAL [facility];  Service: Gastroenterology   ??? PR ERCP REMOVE FOREIGN BODY/STENT BILIARY/PANC DUCT N/A 06/29/2017    Procedure: ENDOSCOPIC RETROGRADE  CHOLANGIOPANCREATOGRAPHY (ERCP); W/ REMOVAL OF FOREIGN BODY/STENT FROM BILIARY/PANCREATIC DUCT(S);  Surgeon: Vonda Antigua, MD;  Location: GI PROCEDURES [facility]OR[facility];  Service: Gastroenterology   ??? PR ERCP STENT PLACEMENT BILIARY/PANCREATIC DUCT N/A 11/29/2016    Procedure: ENDOSCOPIC RETROGRADE CHOLANGIOPANCREATOGRAPHY (ERCP); WITH PLACEMENT OF ENDOSCOPIC STENT INTO BILIARY OR PANCREATIC DUCT;  Surgeon: Chriss Driver, MD;  Location: GI PROCEDURES [facility];  Service: Gastroenterology   ??? PR ERCP STENT PLACEMENT BILIARY/PANCREATIC DUCT N/A 04/26/2017    Procedure: ENDOSCOPIC RETROGRADE CHOLANGIOPANCREATOGRAPHY (ERCP); WITH PLACEMENT OF ENDOSCOPIC STENT INTO BILIARY OR PANCREATIC DUCT;  Surgeon: Mayford Knife, MD;  Location: GI PROCEDURES MEM[facility];  Service: Gastroenterology   ??? PR ERCP,W/REMOVAL STONE,BIL/PANCR DUCTS N/A 11/29/2016    Procedure: ERCP; W/ENDOSCOPIC RETROGRADE REMOVAL OF CALCULUS/CALCULI FROM BILIARY &/OR PANCREATIC DUCTS;  Surgeon: Chriss Driver, MD;  Location: GI PROCEDURES MEMOR[facility];  Service: Gastroenterology   ??? PR ERCP,W/REMOVAL STONE,BIL/PANCR DUCTS N/A 02/10/2017    Procedure: ERCP; W/ENDOSCOPIC RETROGRADE REMOVAL OF CALCULUS/CALCULI FROM BILIARY &/OR PANCREATIC DUCTS;  Surgeon: Mayford Knife, MD;  Location: GI PROCEDURES MEMOR[facility];  Service: Gastroenterology   ??? PR ERCP,W/REMOVAL STONE,BIL/PANCR DUCTS N/A 04/26/2017    Procedure: ERCP; W/ENDOSCOPIC RETROGRADE REMOVAL OF CALCULUS/CALCULI FROM BILIARY &/OR PANCREATIC DUCTS;  Surgeon: Mayford Knife, MD;  Location: GI PROCEDURES MEMORIAL [facility];  Service: Gastroenterology   ??? PR ERCP,W/REMOVAL STONE,BIL/PANCR DUCTS N/A 06/29/2017    Procedure: ERCP; W/ENDOSCOPIC RETROGRADE REMOVAL OF CALCULUS/CALCULI FROM BILIARY &/OR PANCREATIC DUCTS;  Surgeon: Vonda Antigua, MD;  Location: GI PROCEDURES MEMORIAL [facility];  Service: Gastroenterology   ??? PR ESOPHAGOSCP RIG TRANSORAL HYPOPHARYNX CRV ESOPH N/A 04/04/2018    Procedure: ESOPHAGOSCOPY, RIGID, TRANSORAL WITH DIVERTICULECTOMY OF HYPOPHARYNX OR CERVICAL ESOPHAGUS, WITH CRICOPHARYNGEAL MYOTOMY, INCLUDES USE OF TELESCOPE OR OPERATING MICROSCOPE AND REPAIR, WHEN PERFORMED;  Surgeon: Vista Deck, MD;  Location: MAIN OR [facility];  Service: ENT   ??? PR INSER HEART TEMP PACER ONE CHMBR N/A 10/02/2016    Procedure: Tempoarary Pacemaker Insertion;  Surgeon: Meredith Leeds, MD;  Location: [facility];  Service: Cardiology   ??? PR LAP,DIAGNOSTIC ABDOMEN N/A 01/22/2016    Procedure: Laparoscopy, Abdomen, Peritoneum, & Omentum, Diagnostic, W/Wo Collection Specimen(S) By Brushing Or Washing;  Surgeon: Particia Nearing, MD;  Location: MAIN OR [facility];  Service: Transplant   ??? PR PLACE PERCUT GASTROSTOMY TUBE N/A 11/17/2016    Procedure: UGI ENDO; W/DIRECTED PLCMT PERQ GASTROSTOMY TUBE;  Surgeon: Cletis Athens, MD;  Location: GI PROCEDURES [facility];  Service: Gastroenterology   ??? PR TRACHEOSTOMY, PLANNED N/A 09/29/2016    Procedure: TRACHEOSTOMY PLANNED (SEPART PROC);  Surgeon: Katherina Mires, MD;  Location: MAIN OR [facility];  Service: Trauma   ??? PR TRANSCATH INSERT OR REPLACE LEADLESS PM VENTR N/A 10/11/2016    Procedure: Pacemaker Implant/Replace Leadless;  Surgeon: Meredith Leeds, MD;  Location: [facility];  Service: Cardiology   ??? PR TRANSPLANT LIVER,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    Procedure: Liver Allotransplantation; Orthotopic, Partial Or Whole, From Cadaver Or Living Donor, Any Age;  Surgeon: Doyce Loose, MD;  Location: MAIN OR [facility];  Service: Transplant   ??? PR TRANSPLANT,PREP DONOR LIVER, WHOLE N/A 09/15/2016    Procedure: Rogelia Boga Std Prep Cad Donor Whole Liver Gft Prior Tnsplnt,Inc Chole,Diss/Rem Surr Tissu Wo Triseg/Lobe Splt;  Surgeon: Doyce Loose, MD;  Location: MAIN OR [facility];  Service: Transplant   ??? PR UPPER GI ENDOSCOPY,BIOPSY N/A 07/17/2012    Procedure: UGI ENDOSCOPY; WITH BIOPSY, SINGLE OR MULTIPLE;  Surgeon: Jacqulyn Ducking  Foy Guadalajara, MD;  Location: GI PROC[facility]URES [facility];  Service: Gastroenterology   ??? PR UPPER GI ENDOSCOPY,DIAGNOSIS N/A 02/04/2014    Procedure: UGI ENDO, INCLUDE ESOPHAGUS, STOMACH, & DUODENUM &/OR JEJUNUM; DX W/WO COLLECTION SPECIMN, BY BRUSH OR WASH;  Surgeon: Wilburt Finlay, MD;  Location: GI PROCEDURES MEMORIAL [facility];  Service: Gastroenterology   ??? PR UPPER GI ENDOSCOPY,LIGAT VARIX N/A 11/05/2013    Procedure: UGI ENDO; W/BAND LIG ESOPH &/OR GASTRIC VARICES;  Surgeon: Wilburt Finlay, MD;  Location: GI PROCEDURES MEMOR[facility];  Service: Gastroenterology       Medications:    Current Outpatient Medications:   ???  alendronate (FOSAMAX) 70 MG tablet, Take 1 tablet (70 mg total) by mouth every seven (7) days., Disp: 4 tablet, Rfl: 11  ???  bisacodyl (DULCOLAX, BISACODYL,) 5 mg EC tablet, Take 5 mg by mouth daily as needed. , Disp: , Rfl:   ???  blood sugar diagnostic Strp, Use three (3) times a day before meals., Disp: 100 each, Rfl: 7  ???  blood-glucose meter (GLUCOSE MONITORING KIT) kit, Use as instructed, Disp: 1 each, Rfl: 0  ???  calcium carbonate (OS-CAL) 500 mg calcium (1,250 mg) chewable tablet, Chew 2 tablets Two (2) times a day. , Disp: , Rfl:   ???  cycloSPORINE modified (NEORAL) 100 MG capsule, Take 2 capsules (200 mg) by mouth in the morning and 1 capsule in the evening with three 25mg  capsules (For a total daily dose of 200mg  each morning and 175mg  every evening), Disp: 90 each, Rfl: 11  ???  cycloSPORINE modified (NEORAL) 25 MG capsule, Take 3 capsules (75mg ) by mouth every evening with a 100mg  capsule every evening (For a total dose of 175mg  every evening), Disp: 90 each, Rfl: 11  ???  ferrous sulfate 325 (65 FE) MG tablet, Take 325 mg by mouth., Disp: , Rfl:   ???  glimepiride (AMARYL) 4 MG tablet, Take 4 mg by mouth., Disp: , Rfl:   ???  insulin glargine (BASAGLAR KWIKPEN U-100 INSULIN) 100 unit/mL (3 mL) injection pen, Inject 0.12 mL (12 Units total) under the skin nightly., Disp: 15 mL, Rfl: 12  ???  insulin lispro (HUMALOG KWIKPEN INSULIN) 100 unit/mL injection pen, Inject 4 Units under the skin Three (3) times a day before meals., Disp: 15 mL, Rfl: 12  ???  magnesium oxide-Mg AA chelate (MAGNESIUM, AMINO ACID CHELATE,) 133 mg Tab, Take 2 tablets by mouth Two (2) times a day., Disp: 100 tablet, Rfl: PRN  ???  melatonin 3 mg Tab, Take 1 tablet (3 mg total) by mouth nightly., Disp: 30 tablet, Rfl: 0  ???  mirtazapine (REMERON) 15 MG tablet, TAKE 1 TABLET BY MOUTH NIGHTLY, Disp: 90 tablet, Rfl: 3  ???  mycophenolate (CELLCEPT) 250 mg capsule, TAKE 2 CAPSULES (500MG ) BY MOUTH TWICE DAILY, Disp: 120 capsule, Rfl: 7  ???  nitrofurantoin, macrocrystal-monohydrate, (MACROBID) 100 MG capsule, Take 1 capsule (100 mg total) by mouth twice daily until gone., Disp: 13 capsule, Rfl: 0  ???  pen needle, diabetic 32 gauge x 5/32 Ndle, USE WITH INSULIN PEN 4 TIMES DAILY AS DIRECTED, Disp: 100 each, Rfl: 1  ???  pregabalin (LYRICA) 75 MG capsule, Take 1 capsule (75 mg total) by mouth 2 (two) times daily, Disp: 60 capsule, Rfl: 6  ???  sildenafiL, pulm.hypertension, (REVATIO) 20 mg tablet, TAKE 2 TABLETS (40 MG TOTAL) BY G-TUBE THREE TIMES DAILY, Disp: 180 tablet, Rfl: 6     Allergies:  No Known Allergies  Family History:  Family History   Problem Relation Age of Onset   ??? Hypertension Mother    ??? Cancer Mother    ??? Heart disease Mother    ??? Heart disease Father    ??? Cirrhosis Neg Hx    ??? Liver cancer Neg Hx    ??? Anemia Neg Hx    ??? Diabetes Neg Hx    ??? Kidney disease Neg Hx    ??? Obesity Neg Hx    ??? Thyroid disease Neg Hx    ??? Osteoporosis Neg Hx    ??? Coronary artery disease Neg Hx    ??? Anesthesia problems Neg Hx    ??? Basal cell carcinoma Neg Hx    ??? Squamous cell carcinoma Neg Hx      Social History:  Social History     Tobacco Use   ??? Smoking status: Never Smoker   ??? Smokeless tobacco: Never Used   ??? Tobacco comment: Smoked in high school for about 5 years.    Substance Use Topics   ??? Alcohol use: No   ??? Drug use: No       Review of Systems:  Denies chest pain, sob, fevers, n/v, abd pain      Physical Exam:   Constitutional:  Vitals reviewed in chart, patient has normal appearance. Well nourished, well-developed, no acute distress  Voice:  Hoarse, breathy, weak voice  Respiration:  Breathing comfortably, no stridor.  CV: No clubbing/cyanosis/edema in hands.   Eyes:  extraocular motion intact, sclera normal.   Neuro:  Alert and oriented times 3, Cranial nerves 2-12 intact and symmetric bilaterally. Except X.  Head and Face:  Skin with no masses or lesions, sinuses nontender to palpation, facial nerve fully intact.   Salivary Glands:  Parotid and submandibular glands normal bilaterally.   Ears:  Normal external ears  Nose:  External nose midline, anterior rhinoscopy is normal with limited visualization just to the anterior interior turbinate.   Oral Cavity/Oropharynx/Lips:  Normal mucous membranes, normal floor of mouth/tongue/oropharynx, no masses or lesions are noted.  Good dentition  Pharyngeal Walls:  No masses noted.  Neck/Lymph:  No lymphadenopathy, no thyroid masses. Previous tracheostoma scar well healed.  Larynx: Mirror evaluation insufficient to visualize secondary to prominent gag        Assessment:   Jeffrey Ward is a 68 y.o. 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  He  has a past medical history of Atrial fibrillation (CMS-HCC), Basal cell carcinoma, Cancer (CMS-HCC), Cirrhosis (CMS-HCC), Depression, Diabetes (CMS-HCC), Headache, Hepatitis C (07/17/2012), History of transfusion, Liver disease, Low back pain (07/17/2012), and Varices, esophageal (CMS-HCC).    Findings:   dypsphonia  right vocal fold immobility   Glottic insufficiency   --all temporally related to liver transplant transplant. (prolonged intubation, trach possible etiologies)   05/31/17 Status post in office injection augmentation with Cymetra in the [facility]   Significantly complicated history surrounding injection including hemothorax and anemia prior to injection followed by readmission x2 for fall rib pain and intubation for ERCP.  S/p [facility] on 09/13/17 for in office [facility] injection TTH cymetra 0.35ml.   Small Zenker diverticulum, mild pharyngeal dysphagia  S/p endoscopic co2 laser zenker diverticulotomy and botox injection CP 04/04/18     Persistent breathy dysphonia.     Plan:  Significant discussion was had with the patient today. He has some limited motion  to the right vocal fold. He has a small gap but is quite breathy.  There is an additional functional voice component on top of his paresis.     We discussed pursuing right medialization thyroplasty under local/mac probable silastic, possible goretex. We will recommend postop voice therapy.     The type I thyroplasty surgery itself was discussed at length. The risks, benefits, and alternatives were explained in detail which include but is not limited to: anesthesia, pain, bleeding, infection, entrance into airway, implant extrusion, need for revision, airway swelling, hoarseness, need for tracheostomy <1% and unexpected risks. The patient understands these risks and is willing to proceed with surgery.  All questions were answered.    We also discussed that we may photograph or film parts of the patient's case, radiography and care.?? These may be used for teaching or communicating with other physicians.?? Verbal consent was given for this. We also discussed that I work as a Chiropodist and medical students that will be a part of the patient's care.       Now off anticoagulation. Hold baby asa through surgery. Pacemaker will need to be reprogrammed to mode that tolerates bovie. Recheck platelets- intermittently low, will discuss with transplant doc if any utility in replacement day of surgery.

## 2018-11-05 NOTE — Unmapped (Signed)
COVID Pre-Procedure Intake Form     Assessment     Fabian Walder is a 68 y.o. male presenting to [facility] for COVID testing.     Plan     If no testing performed, pt counseled on routine care for respiratory illness.  If testing performed, COVID sent.  Patient directed to Home given findings during today's visit.    Subjective     Ivan Lacher is a 68 y.o. male who [facility] with complaints of the following:    Exposure History: In the last 21 days?     Have you traveled outside of [place]? No               Have you been in close contact with someone confirmed by a test to have COVID? (Close contact is within 6 feet for at least 10 minutes) No       Have you worked in a health care facility? No     Lived or worked facility like a nursing home, group home, or assisted living?    No         Are you scheduled to have surgery or a procedure in the next 3 days? Yes               Are you scheduled to receive cancer chemotherapy within the next 7 days?    No     Have you ever been tested before for COVID-19 with a swab of your nose? No   Are you a healthcare worker being tested so to return to work No     Right now,  do you have any of the following that developed over the past 7 days (as stated by patient on intake form):    Subjective fever (felt feverish) No   Chills (especially repeated shaking chills) No   Severe fatigue (felt very tired) No   Muscle aches No   Runny nose No   Sore throat No   Loss of taste or smell No   Cough (new onset or worsening of chronic cough) No   Shortness of breath No   Nausea or vomiting No   Headache No   Abdominal Pain No   Diarrhea (3 or more loose stools in last 24 hours) No       Scribe's Attestation: Paulita Fujita, FNP obtained and performed the history, physical exam and medical decision making elements that were entered into the chart.  Signed by Mal Amabile, LCSW serving as Scribe, on 11/05/2018 11:31 AM      The documentation recorded by the scribe accurately reflects the service I personally performed and the decisions made by me. Aida Puffer, FNP  November 05, 2018 4:20 PM

## 2018-11-05 NOTE — Unmapped (Signed)
Per Anesthesia's guidelines:    Please take the following medications the morning of your procedure with a sip of water:    Cyclosporine. Cellcept, Sidenafil  Anesthesia to address insulin and diabetes meds

## 2018-11-05 NOTE — Unmapped (Signed)
Otolaryngology     Reason for visit:  Follow up     History of Present Illness:     Recall, initial presentation on 01/30/17 [name] is a 68 y.o. 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 He spent 3 months in the hospital following his transplant he had respiratory compromise and ultimately had tracheostomy tube for much of that stay. He was decannulated in September before discharge home. 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  Patient reports he lost his voice following liver transplant episode 3 months ago. He states that since surgery his voice is breathy and weak. He cannot control his pitch. His voice symptoms do not fluctuate throughout the day and he has not noticed anything that makes it better or worse. He does have a cough throughout the day. He also endorses globus sensation. He denies dysphagia. He is maintained on an oral diet but has not had his G-tube removed yet due to surgeon preference. He has never had voice issues before and has never seen an ENT doctor for any related issues. He does endorse consistent shortness of breath with exertion and notes that this is likely secondary to his recovery process from his liver transplant. She does take Prilosec for his GERD. Thinks that this is stable. No coughing or choking while eating.    He was noted to have a right vocal fold paralysis with significant breathy voice and glottal insufficiency.   For multilpe reasons intervention was delayed.   He has had quite a complicated course.     He did have anemia and hemothorax.     He underwent in office injection cymetra on 05/31/17 0.45ml right fold.     Since then, he has had multiple set backs in his GI care. He had a fall and possible liver abscess. He underwent intubation and ERCP in April. He has been admitted to the hospital twice.     08/08/17: He returns today in follow-up.  He reports no significant improvement in the voice following injection.  He continues to be weak and breathy.  He is also had increasing coughing on foods and liquids.  No pneumonia.  He has had significant illnesses since his injection however as stated above.    He was taken back to [facility] on 09/13/17 for in office [facility] injection TTH cymetra 0.70ml.     10/03/17:   He returns after injection. The voice improved but by only about 50%. The voice has started to decline. He also complains of coughing while eating. This has not changed with injection augmentation. He does state he is louder and easier, but still weak from voice standpoint.    10.21.19:   Follows up for reassessment. Per wife, voice has remained stable since last visit. Improved compared to prior to injection but still with breathiness and vocal fatigue. He was unable to complete voice therpay. He had Hip fracture surgery recently and was intubated for this. Wife states still with some coughing after meals. No regurgitation after meals. Patient states imprpoved.     03/13/18: He comes in today for preop for voice surgery however he states his swallowing has become more of an issue.  He continues to get choked on solid foods and has to regurgitate to swallow things down.  He feels like this is getting worse and is currently a larger issue than the voice.     04/04/18: OR for endoscopic co2 laser zenker diverticulotomy and botox CP     04/17/18: he comes in today for postop. His postop swallow study showed no extravasation and was dishcarged home on pod 1 stable. He denies fevers, chest pain, tachycardia. He continues to have a cough.     09/24/18: he comes in today for follow up. Regarding his voice, he is still quite frustrated. It is weak and he has vocal fatigue. He is interested in permanent vocal rehab. Recall he is now off coumadin.     With regards to swallow, he is s/p endo co2 laser zenker diverticulotomy on 04/04/18. His wife still complains that he coughs when he eats. He also has dry cough when not eating. He is able to eat all consistencies of food. He had MBS on 05/15/18 that showed minimal residue in minimal residual zenker. He did have some backflow of solids and mild pharyngeal dysphagia.    11/05/18: he returns in fu. No improvement in cough with SLN injection. Still with breathy dysphonia. Here for preop for thyroplasty. No new issues. No URI, no chest pain.       Past Medical History:  Past Medical History:   Diagnosis Date   ??? Atrial fibrillation (CMS-HCC)    ??? Basal cell carcinoma    ??? Cancer (CMS-HCC)    ??? Cirrhosis (CMS-HCC)    ??? Depression    ??? Diabetes (CMS-HCC)    ??? Headache    ??? Hepatitis C 07/17/2012   ??? History of transfusion    ??? Liver disease    ??? Low back pain 07/17/2012   ??? Varices, esophageal (CMS-HCC)        Past Surgical History:  Past Surgical History:   Procedure Laterality Date   ??? ANKLE SURGERY     ??? BACK SURGERY     ??? BACK SURGERY     ??? CARDIAC SURGERY      pacemaker   ??? CHG US GUIDE, TISSUE ABLATION N/A 01/22/2016    Procedure: ULTRASOUND GUIDANCE FOR, AND MONITORING OF, PARENCHYMAL TISSUE ABLATION;  Surgeon: Particia Nearing, MD;  Location: MAIN OR [facility];  Service: Transplant   ??? IR EMBOLIZATION ORGAN ISCHEMIA, TUMORS, INFAR  06/16/2016    IR EMBOLIZATION ORGAN ISCHEMIA, TUMORS, INFAR 06/16/2016 Ammie Dalton, MD IMG VIR H&V [facility]   ??? PR COLON CA SCRN NOT HI RSK IND N/A 02/27/2015    Procedure: COLOREC CNCR SCR;COLNSCPY NO;  Surgeon: Vonda Antigua, MD;  Location: GI PROCEDURES MEM[facility];  Service: Gastroenterology   ??? PR ENDOSCOPIC ULTRASOUND EXAM N/A 02/27/2015    Procedure: UGI ENDO; W/ENDO ULTRASOUND EXAM INCLUDES ESOPHAGUS, STOMACH, &/OR DUODENUM/JEJUNUM;  Surgeon: Vonda Antigua, MD;  Location: GI PROCEDURES MEMORIAL [facility];  Service: Gastroenterology   ??? PR ERCP BALLOON DILATE BILIARY/PANC DUCT/AMPULLA EA N/A 02/10/2017    Procedure: ERCP;WITH TRANS-ENDOSCOPIC BALLOON DILATION OF BILIARY/PANCREATIC DUCT(S) OR OF AMPULLA, INCLUDING SPHINCTERECTOMY, WHEN PERFOREMD,EACH DUCT (16109);  Surgeon: Mayford Knife, MD;  Location: GI PROCEDURES [facility];  Service: Gastroenterology   ??? PR ERCP REMOVE FOREIGN BODY/STENT BILIARY/PANC DUCT N/A 02/10/2017    Procedure: ENDOSCOPIC RETROGRADE CHOLANGIOPANCREATOGRAPHY (ERCP); W/ REMOVAL OF FOREIGN BODY/STENT FROM BILIARY/PANCREATIC DUCT(S);  Surgeon: Mayford Knife, MD;  Location: GI PROCEDURES MEMORIAL [facility];  Service: Gastroenterology   ??? PR ERCP REMOVE FOREIGN BODY/STENT BILIARY/PANC DUCT N/A 06/29/2017    Procedure: ENDOSCOPIC RETROGRADE  CHOLANGIOPANCREATOGRAPHY (ERCP); W/ REMOVAL OF FOREIGN BODY/STENT FROM BILIARY/PANCREATIC DUCT(S);  Surgeon: Vonda Antigua, MD;  Location: GI PROCEDURES [facility];  Service: Gastroenterology   ??? PR ERCP STENT PLACEMENT BILIARY/PANCREATIC DUCT N/A 11/29/2016    Procedure: ENDOSCOPIC RETROGRADE CHOLANGIOPANCREATOGRAPHY (ERCP); WITH PLACEMENT OF ENDOSCOPIC STENT INTO BILIARY OR PANCREATIC DUCT;  Surgeon: Chriss Driver, MD;  Location: GI PROCEDURES [facility];  Service: Gastroenterology   ??? PR ERCP STENT PLACEMENT BILIARY/PANCREATIC DUCT N/A 04/26/2017    Procedure: ENDOSCOPIC RETROGRADE CHOLANGIOPANCREATOGRAPHY (ERCP); WITH PLACEMENT OF ENDOSCOPIC STENT INTO BILIARY OR PANCREATIC DUCT;  Surgeon: Mayford Knife, MD;  Location: GI PROCEDURES MEM[facility];  Service: Gastroenterology   ??? PR ERCP,W/REMOVAL STONE,BIL/PANCR DUCTS N/A 11/29/2016    Procedure: ERCP; W/ENDOSCOPIC RETROGRADE REMOVAL OF CALCULUS/CALCULI FROM BILIARY &/OR PANCREATIC DUCTS;  Surgeon: Chriss Driver, MD;  Location: GI PROCEDURES MEMOR[facility];  Service: Gastroenterology   ??? PR ERCP,W/REMOVAL STONE,BIL/PANCR DUCTS N/A 02/10/2017    Procedure: ERCP; W/ENDOSCOPIC RETROGRADE REMOVAL OF CALCULUS/CALCULI FROM BILIARY &/OR PANCREATIC DUCTS;  Surgeon: Mayford Knife, MD;  Location: GI PROCEDURES MEMOR[facility];  Service: Gastroenterology   ??? PR ERCP,W/REMOVAL STONE,BIL/PANCR DUCTS N/A 04/26/2017    Procedure: ERCP; W/ENDOSCOPIC RETROGRADE REMOVAL OF CALCULUS/CALCULI FROM BILIARY &/OR PANCREATIC DUCTS;  Surgeon: Mayford Knife, MD;  Location: GI PROCEDURES MEMORIAL [facility];  Service: Gastroenterology   ??? PR ERCP,W/REMOVAL STONE,BIL/PANCR DUCTS N/A 06/29/2017    Procedure: ERCP; W/ENDOSCOPIC RETROGRADE REMOVAL OF CALCULUS/CALCULI FROM BILIARY &/OR PANCREATIC DUCTS;  Surgeon: Vonda Antigua, MD;  Location: GI PROCEDURES MEMORIAL [facility];  Service: Gastroenterology   ??? PR ESOPHAGOSCP RIG TRANSORAL HYPOPHARYNX CRV ESOPH N/A 04/04/2018    Procedure: ESOPHAGOSCOPY, RIGID, TRANSORAL WITH DIVERTICULECTOMY OF HYPOPHARYNX OR CERVICAL ESOPHAGUS, WITH CRICOPHARYNGEAL MYOTOMY, INCLUDES USE OF TELESCOPE OR OPERATING MICROSCOPE AND REPAIR, WHEN PERFORMED;  Surgeon: Vista Deck, MD;  Location: MAIN OR [facility];  Service: ENT   ??? PR INSER HEART TEMP PACER ONE CHMBR N/A 10/02/2016    Procedure: Tempoarary Pacemaker Insertion;  Surgeon: Meredith Leeds, MD;  Location: [facility];  Service: Cardiology   ??? PR LAP,DIAGNOSTIC ABDOMEN N/A 01/22/2016    Procedure: Laparoscopy, Abdomen, Peritoneum, & Omentum, Diagnostic, W/Wo Collection Specimen(S) By Brushing Or Washing;  Surgeon: Particia Nearing, MD;  Location: MAIN OR [facility];  Service: Transplant   ??? PR PLACE PERCUT GASTROSTOMY TUBE N/A 11/17/2016    Procedure: UGI ENDO; W/DIRECTED PLCMT PERQ GASTROSTOMY TUBE;  Surgeon: Cletis Athens, MD;  Location: GI PROCEDURES [facility];  Service: Gastroenterology   ??? PR TRACHEOSTOMY, PLANNED N/A 09/29/2016    Procedure: TRACHEOSTOMY PLANNED (SEPART PROC);  Surgeon: Katherina Mires, MD;  Location: MAIN OR [facility];  Service: Trauma   ??? PR TRANSCATH INSERT OR REPLACE LEADLESS PM VENTR N/A 10/11/2016    Procedure: Pacemaker Implant/Replace Leadless;  Surgeon: Meredith Leeds, MD;  Location: [facility];  Service: Cardiology   ??? PR TRANSPLANT LIVER,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    Procedure: Liver Allotransplantation; Orthotopic, Partial Or Whole, From Cadaver Or Living Donor, Any Age;  Surgeon: Doyce Loose, MD;  Location: MAIN OR [facility];  Service: Transplant   ??? PR TRANSPLANT,PREP DONOR LIVER, WHOLE N/A 09/15/2016    Procedure: Rogelia Boga Std Prep Cad Donor Whole Liver Gft Prior Tnsplnt,Inc Chole,Diss/Rem Surr Tissu Wo Triseg/Lobe Splt;  Surgeon: Doyce Loose, MD;  Location: MAIN OR [facility];  Service: Transplant   ??? PR UPPER GI ENDOSCOPY,BIOPSY N/A 07/17/2012    Procedure: UGI ENDOSCOPY; WITH BIOPSY, SINGLE OR MULTIPLE;  Surgeon: Jacqulyn Ducking  Foy Guadalajara, MD;  Location: GI PROC[facility]URES [facility];  Service: Gastroenterology   ??? PR UPPER GI ENDOSCOPY,DIAGNOSIS N/A 02/04/2014    Procedure: UGI ENDO, INCLUDE ESOPHAGUS, STOMACH, & DUODENUM &/OR JEJUNUM; DX W/WO COLLECTION SPECIMN, BY BRUSH OR WASH;  Surgeon: Wilburt Finlay, MD;  Location: GI PROCEDURES [facility]OR[facility];  Service: Gastroenterology   ??? PR UPPER GI ENDOSCOPY,LIGAT VARIX N/A 11/05/2013    Procedure: UGI ENDO; W/BAND LIG ESOPH &/OR GASTRIC VARICES;  Surgeon: Wilburt Finlay, MD;  Location: GI PROCEDURES MEMOR[facility];  Service: Gastroenterology       Medications:    Current Outpatient Medications:   ???  alendronate (FOSAMAX) 70 MG tablet, Take 1 tablet (70 mg total) by mouth every seven (7) days., Disp: 4 tablet, Rfl: 11  ???  bisacodyl (DULCOLAX, BISACODYL,) 5 mg EC tablet, Take 5 mg by mouth daily as needed. , Disp: , Rfl:   ???  blood sugar diagnostic Strp, Use three (3) times a day before meals., Disp: 100 each, Rfl: 7  ???  blood-glucose meter (GLUCOSE MONITORING KIT) kit, Use as instructed, Disp: 1 each, Rfl: 0  ???  calcium carbonate (OS-CAL) 500 mg calcium (1,250 mg) chewable tablet, Chew 2 tablets Two (2) times a day. , Disp: , Rfl:   ???  cycloSPORINE modified (NEORAL) 100 MG capsule, Take 2 capsules (200 mg) by mouth in the morning and 1 capsule in the evening with three 25mg  capsules (For a total daily dose of 200mg  each morning and 175mg  every evening), Disp: 90 each, Rfl: 11  ???  cycloSPORINE modified (NEORAL) 25 MG capsule, Take 3 capsules (75mg ) by mouth every evening with a 100mg  capsule every evening (For a total dose of 175mg  every evening), Disp: 90 each, Rfl: 11  ???  ferrous sulfate 325 (65 FE) MG tablet, Take 325 mg by mouth., Disp: , Rfl:   ???  glimepiride (AMARYL) 4 MG tablet, Take 4 mg by mouth., Disp: , Rfl:   ???  insulin glargine (BASAGLAR KWIKPEN U-100 INSULIN) 100 unit/mL (3 mL) injection pen, Inject 0.12 mL (12 Units total) under the skin nightly., Disp: 15 mL, Rfl: 12  ???  insulin lispro (HUMALOG KWIKPEN INSULIN) 100 unit/mL injection pen, Inject 4 Units under the skin Three (3) times a day before meals., Disp: 15 mL, Rfl: 12  ???  magnesium oxide-Mg AA chelate (MAGNESIUM, AMINO ACID CHELATE,) 133 mg Tab, Take 2 tablets by mouth Two (2) times a day., Disp: 100 tablet, Rfl: PRN  ???  melatonin 3 mg Tab, Take 1 tablet (3 mg total) by mouth nightly., Disp: 30 tablet, Rfl: 0  ???  mirtazapine (REMERON) 15 MG tablet, TAKE 1 TABLET BY MOUTH NIGHTLY, Disp: 90 tablet, Rfl: 3  ???  mycophenolate (CELLCEPT) 250 mg capsule, TAKE 2 CAPSULES (500MG ) BY MOUTH TWICE DAILY, Disp: 120 capsule, Rfl: 7  ???  nitrofurantoin, macrocrystal-monohydrate, (MACROBID) 100 MG capsule, Take 1 capsule (100 mg total) by mouth twice daily until gone., Disp: 13 capsule, Rfl: 0  ???  pen needle, diabetic 32 gauge x 5/32 Ndle, USE WITH INSULIN PEN 4 TIMES DAILY AS DIRECTED, Disp: 100 each, Rfl: 1  ???  pregabalin (LYRICA) 75 MG capsule, Take 1 capsule (75 mg total) by mouth 2 (two) times daily, Disp: 60 capsule, Rfl: 6  ???  sildenafiL, pulm.hypertension, (REVATIO) 20 mg tablet, TAKE 2 TABLETS (40 MG TOTAL) BY G-TUBE THREE TIMES DAILY, Disp: 180 tablet, Rfl: 6     Allergies:  No Known Allergies  Family History:  Family History   Problem Relation Age of Onset   ??? Hypertension Mother    ??? Cancer Mother    ??? Heart disease Mother    ??? Heart disease Father    ??? Cirrhosis Neg Hx    ??? Liver cancer Neg Hx    ??? Anemia Neg Hx    ??? Diabetes Neg Hx    ??? Kidney disease Neg Hx    ??? Obesity Neg Hx    ??? Thyroid disease Neg Hx    ??? Osteoporosis Neg Hx    ??? Coronary artery disease Neg Hx    ??? Anesthesia problems Neg Hx    ??? Basal cell carcinoma Neg Hx    ??? Squamous cell carcinoma Neg Hx      Social History:  Social History     Tobacco Use   ??? Smoking status: Never Smoker   ??? Smokeless tobacco: Never Used   ??? Tobacco comment: Smoked in high school for about 5 years.    Substance Use Topics   ??? Alcohol use: No   ??? Drug use: No       Review of Systems:  Denies chest pain, sob, fevers, n/v, abd pain      Physical Exam:   Constitutional:  Vitals reviewed in chart, patient has normal appearance. Well nourished, well-developed, no acute distress  Voice:  Hoarse, breathy, weak voice  Respiration:  Breathing comfortably, no stridor.  CV: No clubbing/cyanosis/edema in hands.   Eyes:  extraocular motion intact, sclera normal.   Neuro:  Alert and oriented times 3, Cranial nerves 2-12 intact and symmetric bilaterally. Except X.  Head and Face:  Skin with no masses or lesions, sinuses nontender to palpation, facial nerve fully intact.   Salivary Glands:  Parotid and submandibular glands normal bilaterally.   Ears:  Normal external ears  Nose:  External nose midline, anterior rhinoscopy is normal with limited visualization just to the anterior interior turbinate.   Oral Cavity/Oropharynx/Lips:  Normal mucous membranes, normal floor of mouth/tongue/oropharynx, no masses or lesions are noted.  Good dentition  Pharyngeal Walls:  No masses noted.  Neck/Lymph:  No lymphadenopathy, no thyroid masses. Previous tracheostoma scar well healed.  Larynx: Mirror evaluation insufficient to visualize secondary to prominent gag        Assessment:   [name] is a 68 y.o. 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  He  has a past medical history of Atrial fibrillation (CMS-HCC), Basal cell carcinoma, Cancer (CMS-HCC), Cirrhosis (CMS-HCC), Depression, Diabetes (CMS-HCC), Headache, Hepatitis C (07/17/2012), History of transfusion, Liver disease, Low back pain (07/17/2012), and Varices, esophageal (CMS-HCC).    Findings:   dypsphonia  right vocal fold immobility   Glottic insufficiency   --all temporally related to liver transplant transplant. (prolonged intubation, trach possible etiologies)   05/31/17 Status post in office injection augmentation with Cymetra in the [facility]   Significantly complicated history surrounding injection including hemothorax and anemia prior to injection followed by readmission x2 for fall rib pain and intubation for ERCP.  S/p [facility] on 09/13/17 for in office [facility] injection TTH cymetra 0.35ml.   Small Zenker diverticulum, mild pharyngeal dysphagia  S/p endoscopic co2 laser zenker diverticulotomy and botox injection CP 04/04/18     Persistent breathy dysphonia.     Plan:  Significant discussion was had with the patient today. He has some limited motion  to the right vocal fold. He has a small gap but is quite breathy.  There is an additional functional voice component on top of his paresis.     We discussed pursuing right medialization thyroplasty under local/mac probable silastic, possible goretex. We will recommend postop voice therapy.     The type I thyroplasty surgery itself was discussed at length. The risks, benefits, and alternatives were explained in detail which include but is not limited to: anesthesia, pain, bleeding, infection, entrance into airway, implant extrusion, need for revision, airway swelling, hoarseness, need for tracheostomy <1% and unexpected risks. The patient understands these risks and is willing to proceed with surgery.  All questions were answered.    We also discussed that we may photograph or film parts of the patient's case, radiography and care.?? These may be used for teaching or communicating with other physicians.?? Verbal consent was given for this. We also discussed that I work as a Chiropodist and medical students that will be a part of the patient's care.       Now off anticoagulation. Hold baby asa through surgery. Pacemaker will need to be reprogrammed to mode that tolerates bovie. Recheck platelets- intermittently low, will discuss with transplant doc if any utility in replacement day of surgery.

## 2018-11-06 DIAGNOSIS — R490 Dysphonia: Principal | ICD-10-CM

## 2018-11-07 ENCOUNTER — Encounter
Admit: 2018-11-07 | Discharge: 2018-11-08 | Payer: MEDICARE | Attending: Certified Registered" | Primary: Certified Registered"

## 2018-11-07 ENCOUNTER — Ambulatory Visit: Admit: 2018-11-07 | Discharge: 2018-11-08 | Payer: MEDICARE

## 2018-11-07 DIAGNOSIS — R490 Dysphonia: Principal | ICD-10-CM

## 2018-11-07 LAB — BASIC METABOLIC PANEL
ANION GAP: 9 mmol/L (ref 7–15)
BUN / CREAT RATIO: 21
CALCIUM: 9.9 mg/dL (ref 8.5–10.2)
CHLORIDE: 105 mmol/L (ref 98–107)
CO2: 20 mmol/L — ABNORMAL LOW (ref 22.0–30.0)
CREATININE: 1.95 mg/dL — ABNORMAL HIGH (ref 0.70–1.30)
EGFR CKD-EPI AA MALE: 39 mL/min/{1.73_m2} — ABNORMAL LOW (ref >=60)
EGFR CKD-EPI NON-AA MALE: 34 mL/min/{1.73_m2} — ABNORMAL LOW (ref >=60)
POTASSIUM: 6.3 mmol/L (ref 3.5–5.0)
SODIUM: 134 mmol/L — ABNORMAL LOW (ref 135–145)

## 2018-11-07 LAB — POTASSIUM
POTASSIUM: 5.7 mmol/L — ABNORMAL HIGH (ref 3.5–5.0)
Potassium:SCnc:Pt:Ser/Plas:Qn:: 5.7 — ABNORMAL HIGH

## 2018-11-07 LAB — CO2: Carbon dioxide:SCnc:Pt:Ser/Plas:Qn:: 20 — ABNORMAL LOW

## 2018-11-07 NOTE — Unmapped (Signed)
Cyclosporine Therapeutic Monitoring Pharmacy Note    Jeffrey Ward is a 68 y.o. male continuing cyclosporine NEORAL.     Indication: Liver transplant     Date of Transplant: 09/15/2016      Prior Dosing Information: Home regimen 200 mg QAM, 175 mg QPM     Goals:  Therapeutic Drug Levels  Cyclosporine trough goal: 50-100 ng/mL ? (close to 100 per 04/2018 outpatient note)    Additional Clinical Monitoring/Outcomes  ? Monitor renal function (SCr and urine output) and liver function (LFTs)  ? Monitor for signs/symptoms of adverse events (e.g., hyperglycemia, hyperkalemia, hypomagnesemia, hypertension, headache, tremor)    Results:   Cyclosporine level: Not applicable    Pharmacokinetic Considerations and Significant Drug Interactions:  ? Concurrent hepatotoxic medications: None identified  ? Concurrent CYP3A4 substrates/inhibitors: None identified  ? Concurrent nephrotoxic medications: None identified    Assessment/Plan:  Recommendation(s)  ? Continue current regimen of cyclosporine 200 mg QAM and 175 mg QPM    Follow-up  ? Next level has been ordered on 0600 at 11/08/18.   ? A pharmacist will continue to monitor and recommend levels as appropriate    Longitudinal Dose Monitoring:  Date Dose (mg), route AM Scr (mg/dL) Level (ng/mL), time Key Drug Interactions   11/07/18 --, 175 PO pending -- --     Please page service pharmacist with questions/clarifications.    Lebron Conners, PharmD

## 2018-11-07 NOTE — Unmapped (Signed)
Operative Report:    Preoperative Diagnosis:   Right vocal fold dense paresis  Dysphonia  Past Medical History:   Diagnosis Date   ??? Atrial fibrillation (CMS-HCC)    ??? Basal cell carcinoma    ??? Cancer (CMS-HCC)    ??? Cirrhosis (CMS-HCC)    ??? Depression    ??? Diabetes (CMS-HCC)    ??? Headache    ??? Hepatitis C 07/17/2012   ??? History of transfusion    ??? Liver disease    ??? Low back pain 07/17/2012   ??? Varices, esophageal (CMS-HCC)          Postoperative Diagnosis:  Same     Procedure(s) Performed:    1. Medialization Thyroplasty with Silastic implant (CPT [id number])    Teaching Surgeon:  Dr. Martina Sinner    Resident Surgeon: Everardo Pacific, MD    Specimens: None    Anesthesia: Monitored anesthesia care with conscious sedation     Estimated Blood Loss: 10cc    Complications: None     Tubes/Drains: #7 TLS    Dressings: Mastisol, Steri-strips, sterile gauze and Tegederm     Intra-Operative Findings:     1. Evidence of previous surgery with moderate scarring related to previous tracheostomy  2. 5mm x 10mm window drilled, ensuring adequate inferior strut left in place  3. Silastic implant carved after vocal testing 6mm posterior 2mm anterior medialization, placed and secured with 4-0 prolene with significant improvement in voice  4. Closed in layered fashion, 7 TLS drain left in place    Procedure: A conversation was held with the patient about the risks, benefits, and alternatives of surgery including the risk of bleeding, infection, altered voice, and possible need for more surgery. Informed consent was obtained from the patient and he was brought from the preoperative holding area to the operating room and placed supine on the operating table. The patient was rotated 90 degrees and light conscious sedation was administered. A Mayo tray was positioned over the patient's head and dressed appropriately in order to separate him from the surgical field. The neck was appropriately marked and the incisions infiltrated with 1% lidocaine, and a 0.5% bupivacaine, both with 1:100,000 epinephrine. The patient was then sterilely prepped and draped in standard surgical fashion.     After a surgical time-out, the procedure first began with the neck incision which was made using a 15 blade scalpel. Scapel was used to carry the incision down through the superficial and deep dermal tissues and platysma to the level of the median raphae of the strap musculature. The median raphae was carefully and sharply incised and the strap musculature was carefully lateralized. The incision was carried down to the overlying perichondrium of the right thyroid lamina extending just past the midline medially and above the notch superiorly and to posterior extent inferior laterally. The incision was also carried inferior to the level of the cricoid cartilage.    A posteriorly based perichondrial flap was carefully elevated exposing the thyroid cartilage from medial to lateral. Calipers were used to measure a point approximately and 7 mm lateral from the midline and 3mm from the inferior border of the thyroid cartilage. Then, a drill was used to create a window through the right thyroid lamina this was 5mm x 10mm. Inner perichondrium was carefully incised. Elevator was used to release the inner perichoncrium from TA fascia posteriorly, superiorly, and inferiorly, we did not dissect anteriorly.  At this time, fiberoptic laryngoscopy was performed confirming the appropriate location for anticipated  implant placement.  Then, attention was then turned to creation of the implant.    Voice testing was undertaken. This confirmed medialization of 6mm posteriorly and 2mm anteriorly. The point of maximal medialization was 8 mm posterior in the window with 6mm of medialization. Silastic implant was hand carved. This was placed within the preformed window. Appropriate medialization was confirmed with fiberoptic scope exam.  Once the implant was optimized, this was secured in place using 4-0 prolene to the inferior strut.     The wound was copiously irrigated.The strap musculature was carefully reapproximated using interrupted 3-0 Vicryl suture. A TLS drain was placed into the left neck and into the wound bed just under the strap musculature. The platysma and then deep dermis were sequentially reapproximated using interrupted 4-0 Vicryl sutures. The epidermis was reapproximated using a running subcuticular Monocryl suture. The incision was then cleaned and dried, Mastisol and Steri-Strips were applied and a light pressure dressing with Tegaderm was placed.    The patient was then turned over to the anesthesia team awake and spontaneously ventilating.  All instrument, sharp and lap counts were correct at the end of the case.    The patient was transported to the PACU in stable condition where he will be admitted for further management.    Teaching Surgeon Attestation:  Dr. Martina Sinner was present and actively involved for the entirety of the procedure.        I was present during all critical and key portions of the procedure and immediately available to furnish services throughout the entire duration of the procedure.?? See resident note for details. Christene Lye, MD

## 2018-11-07 NOTE — Unmapped (Signed)
Brief Operative Note  (CSN: [id number])      Date of Surgery: 11/07/2018    Pre-op Diagnosis: vocal cord paralysis    Post-op Diagnosis: Vocal cord paralysis [J38.00]    Procedure(s):  R31 LARYNGOPLASTY, MEDIALIZATION, UNILATERAL: 78469 (CPT??)  Note: Revisions to procedures should be made in chart - see Procedures activity.    Performing Service: ENT  Surgeon(s) and Role:     * Rupali Harvie Bridge, MD - Primary     * Dorena Dew, MD - Resident - Assisting    Assistant: None    Findings: Successful right medialization using silastic implant, 7 TLS drain in place    Anesthesia: General    Estimated Blood Loss: 10 mL    Complications: None    Specimens: None collected    Implants:   Implant Name Type Inv. Item Serial No. Manufacturer Lot No. LRB No. Used Action   BLOCK [id number] SILICONE - [id number] ENT Implant BLOCK [id number] SILICONE  [facility] MEDICAL [id number] Midline 1 Implanted       Surgeon Notes: I was present and scrubbed for the entire procedure    Dorena Dew   Date: 11/07/2018  Time: 11:55 AM

## 2018-11-07 NOTE — Unmapped (Signed)
Recommendations for this procedure:    If using monopolar electrocautery, follow recommendations below    Perioperative Cardiac Implanted Device evaluation  Procedure: R31 LARYNGOPLASTY, MEDIALIZATION, UNILATERAL  Surgeon: Vista Deck, MD  Location of Device: Right Ventricle   Device Manufacturer/Type: Medtronic Micra VR TCP    Underlying Rhythm: SR   Dependent:  No  Mode: VVIR  Lower Rate: 50    *If Monopolar Electrocautery and Magnet in Sterile Field*  >6 inches from ICD:  Secure Magnet over ICD  >6 inches from PM: Have Magnet available   Bipolar Cautery:  NO CHANGES NEEDED    Use Bipolar electrocautery if needed.  Patient is not-dependent. RV pacing 7.9% at lower rate of 50 bpm.  Leadless pacemaker implanted in Right Ventricle.  This device does not have a Magnet response however based on patient Hx and location of pacemaker in relation to surgery, no intervention is required on DOS.  Device will not need to be checked before or after surgery.      Questions during the case, please call the Device RN via Vocera  Dial *[phone] for Device Nurse  Dial [phone] to access Vocera system and ask for Device Nurse

## 2018-11-07 NOTE — Unmapped (Signed)
Patient on Fall and VTE precautions.  Bed remains in lowest position, locked, side rails up x 2 and non skid socks on patient at all times when ambulating.      BP elevated, MD made aware and prns given, will follow up and continue to monitor.  Patient states better pain control at this time.      Patient oriented to room and unit policies.  Patient waiting for wife to arrive.       Multi interdisciplinary team approach involved in patients care.    Problem: Wound  Goal: Optimal Wound Healing  Outcome: Ongoing - Unchanged     Problem: Skin Injury Risk Increased  Goal: Skin Health and Integrity  Outcome: Ongoing - Unchanged     Problem: Adult Inpatient Plan of Care  Goal: Plan of Care Review  Outcome: Ongoing - Unchanged  Goal: Patient-Specific Goal (Individualization)  Outcome: Ongoing - Unchanged  Goal: Absence of Hospital-Acquired Illness or Injury  Outcome: Ongoing - Unchanged  Goal: Optimal Comfort and Wellbeing  Outcome: Ongoing - Unchanged  Goal: Readiness for Transition of Care  Outcome: Ongoing - Unchanged  Goal: Rounds/Family Conference  Outcome: Ongoing - Unchanged

## 2018-11-08 LAB — BASIC METABOLIC PANEL
ANION GAP: 8 mmol/L (ref 7–15)
BLOOD UREA NITROGEN: 47 mg/dL — ABNORMAL HIGH (ref 7–21)
BUN / CREAT RATIO: 23
CHLORIDE: 106 mmol/L (ref 98–107)
CO2: 20 mmol/L — ABNORMAL LOW (ref 22.0–30.0)
CREATININE: 2.06 mg/dL — ABNORMAL HIGH (ref 0.70–1.30)
EGFR CKD-EPI AA MALE: 37 mL/min/{1.73_m2} — ABNORMAL LOW (ref >=60)
GLUCOSE RANDOM: 184 mg/dL — ABNORMAL HIGH (ref 70–179)
POTASSIUM: 5.6 mmol/L — ABNORMAL HIGH (ref 3.5–5.0)
SODIUM: 134 mmol/L — ABNORMAL LOW (ref 135–145)

## 2018-11-08 LAB — CYCLOSPORINE (FPIA) BLOOD: Lab: 146

## 2018-11-08 LAB — EGFR CKD-EPI NON-AA MALE: Lab: 32 — ABNORMAL LOW

## 2018-11-08 LAB — POTASSIUM: Potassium:SCnc:Pt:Ser/Plas:Qn:: 5.9 — ABNORMAL HIGH

## 2018-11-08 MED ORDER — PREDNISONE 5 MG TABLET
ORAL_TABLET | 0 refills | 0.00 days | Status: CP
Start: 2018-11-08 — End: ?
  Filled 2018-11-08: qty 10, 4d supply, fill #0

## 2018-11-08 MED ORDER — CEPHALEXIN 500 MG CAPSULE
ORAL_CAPSULE | Freq: Two times a day (BID) | ORAL | 0 refills | 10.00 days | Status: CP
Start: 2018-11-08 — End: 2018-11-18
  Filled 2018-11-08: qty 20, 10d supply, fill #0

## 2018-11-08 MED ORDER — OXYCODONE 5 MG TABLET
ORAL_TABLET | ORAL | 0 refills | 2.00000 days | Status: CP | PRN
Start: 2018-11-08 — End: 2018-11-13
  Filled 2018-11-08: qty 10, 2d supply, fill #0

## 2018-11-08 MED FILL — CEPHALEXIN 500 MG CAPSULE: 10 days supply | Qty: 20 | Fill #0 | Status: AC

## 2018-11-08 MED FILL — OXYCODONE 5 MG TABLET: 2 days supply | Qty: 10 | Fill #0 | Status: AC

## 2018-11-08 MED FILL — PREDNISONE 5 MG TABLET: 4 days supply | Qty: 10 | Fill #0 | Status: AC

## 2018-11-08 NOTE — Unmapped (Signed)
Pain managed.  No falls noted.  Fall protocol and VTE precautions maintained.  Freq repositioning encouraged.  Reviewed plan of care.  Will continue to monitor and assess.     Problem: Wound  Goal: Optimal Wound Healing  Outcome: Progressing     Problem: Skin Injury Risk Increased  Goal: Skin Health and Integrity  Outcome: Progressing     Problem: Adult Inpatient Plan of Care  Goal: Plan of Care Review  Outcome: Progressing  Goal: Patient-Specific Goal (Individualization)  Outcome: Progressing  Goal: Absence of Hospital-Acquired Illness or Injury  Outcome: Progressing  Goal: Optimal Comfort and Wellbeing  Outcome: Progressing  Goal: Readiness for Transition of Care  Outcome: Progressing  Goal: Rounds/Family Conference  Outcome: Progressing

## 2018-11-08 NOTE — Unmapped (Signed)
Daily Progress Note    Assessment/Plan:  Patient is a 68 yo male with a hx of DM, CHF, liver transplant, right vocal fold paralysis, pacemaker, and pulm HTN who underwent Type 1 Thyroplasty 8/26.     - Change drain collection tube q 4 hours  - Ancef x 2 doses  - Decadron 8mg  x 2 doses  - continue home medications  - will plan for discharge tomorrow if he does well overnight and drain output appropriate for removal      Principal Problem:    Vocal cord paralysis       LOS: 0 days     Subjective:    Interval History: No acute events since surgery, breathing easily on room air, voice is improved    Objective:     Vital signs in last 24 hours:  Temp:  [35.9 ??C-36.4 ??C] 35.9 ??C  Heart Rate:  [62-79] 65  SpO2 Pulse:  [58-64] 64  Resp:  [13-20] 18  BP: (130-215)/(77-99) 183/79  MAP (mmHg):  [113-139] 113  SpO2:  [96 %-100 %] 100 %    Intake/Output last 24 hours:  No intake/output data recorded.    Physical Exam  General: Awake, alert, oriented sitting upright in bed in NAD.  HEENT: NCAT; HB 1/6; EOMI - sclera and conjunctiva clear; facial sensation intact and symmetric bilaterally in all division of CN 5; OC/OP clear - FOM soft and tongue protrudes midline.  Neck: Incision with steris in place, drain in place with serosang drainage    I saw and evaluated the patient, participating in the key portions of the service.?? I reviewed the resident???s note.?? I agree with the resident???s findings and plan. Christene Lye, MD

## 2018-11-08 NOTE — Unmapped (Signed)
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 Atrial fibrillation (CMS-HCC)    ??? Basal cell carcinoma    ??? Cancer (CMS-HCC)    ??? Cirrhosis (CMS-HCC)    ??? Depression    ??? Diabetes (CMS-HCC)    ??? Headache    ??? Hepatitis C 07/17/2012   ??? History of transfusion    ??? Liver disease    ??? Low back pain 07/17/2012   ??? Varices, esophageal (CMS-HCC)          Discharge Diagnosis:   Vocal fold paralysis s/p thyroplasty   Patient Active Problem List   Diagnosis   ??? Hepatitis C   ??? Low back pain   ??? Hematuria   ??? Calculus of ureter   ??? Anemia   ??? Benign prostatic hyperplasia   ??? Chronic pain disorder   ??? Dysrhythmia   ??? History of gastroesophageal reflux (GERD)   ??? History of hepatitis C virus infection   ??? History of substance abuse (CMS-HCC)   ??? Hyperglycemia, unspecified   ??? Intermittent vertigo   ??? Microscopic hematuria   ??? Postlaminectomy syndrome, lumbar region   ??? Thrombocytopenia (CMS-HCC)   ??? Vitamin D deficiency   ??? Atrial fibrillation with RVR (CMS-HCC)   ??? Secondary esophageal varices (CMS-HCC)   ??? Right ventricular dilation   ??? End-stage liver disease (CMS-HCC)   ??? HCC (hepatocellular carcinoma) (CMS-HCC)   ??? Paroxysmal atrial fibrillation (CMS-HCC)   ??? Hepatic encephalopathy (CMS-HCC)   ??? S/P liver transplant (CMS-HCC)   ??? Pulmonary hypertension, moderate to severe (CMS-HCC)   ??? Acute right heart failure (CMS-HCC)   ??? Tracheostomy dependent (CMS-HCC)   ??? Mixed level of activity delirium due to multiple etiologies, resolved   ??? Insomnia   ??? Peripheral neuropathy   ??? Influenza A   ??? Hemothorax   ??? Gastrostomy tube in place (CMS-HCC)   ??? H/O tracheostomy   ??? S/P placement of cardiac pacemaker   ??? Uncontrolled type 2 diabetes mellitus without complication, with long-term current use of insulin   ??? Vocal cord paralysis   ??? Zenker's diverticulum   ??? Medtronic Mirca VR TCP Leadless Pacemaker       Hospital Procedures:  11/07/2018   R31 LARYNGOPLASTY, MEDIALIZATION, UNILATERAL   Procedure:    R31 LARYNGOPLASTY, MEDIALIZATION, UNILATERAL  CPT(R) Code:  57846 - PR LARYNGOPLASTY MEDIALIZATION UNLIATERAL        Reason for Admission/HPI:  Patient is a 68 yo male with medical history noted above who presented for medialization laryngoplasty for right TVF paralysis.     Hospital Course:    The patient was taken to the OR on 11/07/18 for right medialization thyroplasty under local/mac using a silastic implant. He tolerated the procedure well and was admitted post operatively for airway monitoring and drain monitoring. He did well overnight with no acute events. He had some hyperkalemia overnight that was treated with insulin and ivf this appropriately decreased on pod1. Drain output was appropriate for removal on POD1. On the day of discharge, the patient was tolerating PO, breathing easily on room air, pain was controlled on oral medications, and he was voiding. No stridor or diff breathing.  Hospital Consults:  IP CONSULT TO NUTRITION SERVICES      Other Clinically Significant Diagnoses:  Past Medical History:   Diagnosis Date   ??? Atrial fibrillation (CMS-HCC)    ??? Basal cell carcinoma    ??? Cancer (CMS-HCC)    ??? Cirrhosis (CMS-HCC)    ??? Depression    ??? Diabetes (CMS-HCC)    ??? Headache    ??? Hepatitis C 07/17/2012   ??? History of transfusion    ??? Liver disease    ??? Low back pain 07/17/2012   ??? Varices, esophageal (CMS-HCC)            Discharge Condition:    Subjective   No acute events overnight. The patient was seen and examined by the Otolaryngology-Head and Neck Surgery team on the day of discharge. Discharge plan was discussed, instructions were given and all questions answered.    Objective:    Temp:  [35.8 ??C-36.6 ??C] 35.8 ??C  Heart Rate:  [60-79] 61  SpO2 Pulse:  [59-64] 64  Resp: [16-20] 19  BP: (128-215)/(70-99) 145/79  MAP (mmHg):  [89-139] 106  SpO2:  [97 %-100 %] 99 %  BMI (Calculated):  [27.27] 27.27    Exam:  General: Awake, alert, oriented and in no acute distress.  HEENT: NCAT; HB 1/6; EOMI - sclera and conjunctiva clear; facial sensation intact and symmetric bilaterally in all division of CN 5; OC/OP clear - FOM soft and tongue protrudes midline.   Neck: Incision intact with steris in place, drain removed, no fluctuance or evidence of hematoma  Pulm: Unlabored without stridor/stertor.    Condition at Discharge: Improved  Discharge Medications:      Your Medication List      START taking these medications    cephalexin 500 MG capsule  Commonly known as: KEFLEX  Take 1 capsule (500 mg total) by mouth Two (2) times a day for 10 days.     oxyCODONE 5 MG immediate release tablet  Commonly known as: ROXICODONE  Take 1 tablet (5 mg total) by mouth every four (4) hours as needed for up to 5 days.     predniSONE 5 MG tablet  Commonly known as: DELTASONE  Take 20 mg by mouth for one day, then 15 mg the next day, then 10 mg the next day, then 5mg  then stop        CONTINUE taking these medications    alendronate 70 MG tablet  Commonly known as: FOSAMAX  Take 1 tablet (70 mg total) by mouth every seven (7) days.     BASAGLAR KWIKPEN U-100 INSULIN 100 unit/mL (3 mL) injection pen  Generic drug: insulin glargine  Inject 0.12 mL (12 Units total) under the skin nightly.     blood sugar diagnostic Strp  Use three (3) times a day before meals.     blood-glucose meter kit  Commonly known as: glucose monitoring kit  Use as instructed     calcium carbonate 500 mg calcium (1,250 mg) chewable tablet  Commonly known as: OS-CAL  Chew 2 tablets Two (2) times a day.     cycloSPORINE modified 100 MG capsule  Commonly known as: NEORAL  Take 2 capsules (200 mg) by mouth in the morning and 1 capsule in the evening with three 25mg  capsules (For a total daily dose of 200mg  each morning and 175mg  every evening) cycloSPORINE modified 25 MG capsule  Commonly known as: NEORAL  Take 3 capsules (75mg ) by mouth every evening with a 100mg   capsule every evening (For a total dose of 175mg  every evening)     DULCOLAX (BISACODYL) 5 mg EC tablet  Generic drug: bisacodyL  Take 5 mg by mouth daily as needed.     ferrous sulfate 325 (65 FE) MG tablet  Take 325 mg by mouth.     glimepiride 4 MG tablet  Commonly known as: AMARYL  Take 4 mg by mouth.     insulin lispro 100 unit/mL injection pen  Commonly known as: HumaLOG KwikPen Insulin  Inject 4 Units under the skin Three (3) times a day before meals.     magnesium (amino acid chelate) 133 mg Tab  Generic drug: magnesium oxide-Mg AA chelate  Take 2 tablets by mouth Two (2) times a day.     melatonin 3 mg Tab  Take 1 tablet (3 mg total) by mouth nightly.     mirtazapine 15 MG tablet  Commonly known as: REMERON  TAKE 1 TABLET BY MOUTH NIGHTLY     mycophenolate 250 mg capsule  Commonly known as: CELLCEPT  TAKE 2 CAPSULES (500MG ) BY MOUTH TWICE DAILY     nitrofurantoin (macrocrystal-monohydrate) 100 MG capsule  Commonly known as: MACROBID  Take 1 capsule (100 mg total) by mouth twice daily until gone.     pen needle, diabetic 32 gauge x 5/32 (4 mm) Ndle  USE WITH INSULIN PEN 4 TIMES DAILY AS DIRECTED     pregabalin 75 MG capsule  Commonly known as: LYRICA  Take 1 capsule (75 mg total) by mouth 2 (two) times daily     sildenafiL (pulm.hypertension) 20 mg tablet  Commonly known as: REVATIO  TAKE 2 TABLETS (40 MG TOTAL) BY G-TUBE THREE TIMES DAILY            Pending Test Results:   None      Discharge Instructions:   Other Instructions:  Other Instructions     Discharge instructions      Discharge Instructions    You potassium was noted to be elevated during your admission. This improved with medication. Please follow up this week or early next week with your primary care doctor to retest this level.     WOUND CARE:   You may shower in 2 days but keep wound dry.  Do not submerge wound for 2 weeks. Let steristrips fall off by themselves.      ACTIVITY:   Minimize your activities with only light activity for the first week following surgery.   Listen to your body!  If you feel tired over the first few days, you should rest.  No straining or heavy lifting > 2 bags of groceries.     MEDICATIONS:    Resume all of your home medications unless instructed otherwise.    PAIN:   Mild to moderate pain: Over the counter Tylenol (Acetaminophen) as per the instructions on the bottle  Severe pain: Take your pain medicine prescription as directed:    You have been prescribed a narcotic pain medication. Do not drive or make critical/important decisions while taking narcotics pain medications. Narcotic medications may cause constipation. Ensure you have adequate (>25 grams/day) of fiber in your diet and drink at least 64 oz. of water daily. You may also wish to take an over the counter stool softener once or twice daily.    DRIVING:  Do not drive within 24 hours of receiving anesthesia    Do not drive while taking prescription pain medication    DIET:  Resume a  normal diet as tolerated.     FOLLOW-UP:    You will have important follow-up appointments starting about one week following surgery    If you do not have an appointment, please call the numbers below to schedule a visit.    APPOINTMENTS:    [facility] [phone]  [facility] [phone]    Call our office or go to the Emergency Room immediately if you have any of the following:  Fever over 101.4  Neck stiffness or significant swelling  Difficulty breathing, swallowing or other concerning signs or symptoms.     Regular hours: [phone] to speak with a nurse.  After hours: [phone] and ask for the Ear, Nose and Throat (ENT) doctor on call               Labs and Other Follow-ups after Discharge:  Follow Up instructions and Outpatient Referrals     Discharge instructions          Future Appointments:  Appointments which have been scheduled for you    Nov 13, 2018 10:30 AM  (Arrive by 10:15 AM)  NEW 60 with Marcha Dutton, MD  [facility] [place] [facility] REGION) [address]  [place] [address]  [phone]      Nov 13, 2018  1:15 PM  (Arrive by 1:00 PM)  POST-OP 15 with Barlow Harrison Harvie Bridge, MD  [place] [facility] [address] [facility] REGION) [address]  [place] [address]  [phone]      Nov 15, 2018  9:15 AM  (Arrive by 8:45 AM)  AT HOME PACEMAKER CHECK with [facility] EP REMOTE MONITORING  [facility] EP REMOTE MONITORING [place] [facility] REGION) [address]  [address] [place] [address]  [phone]   This is a remote appointment, you do not need to come into the office.     Feb 14, 2019  9:15 AM  (Arrive by 8:45 AM)  AT HOME PACEMAKER CHECK with [facility] EP REMOTE MONITORING  [facility] EP REMOTE MONITORING [facility] REGION) [address]  [address] [place] [address]  [phone]   This is a remote appointment, you do not need to come into the office.     May 16, 2019  9:15 AM  (Arrive by 8:45 AM)  AT HOME PACEMAKER CHECK with [facility] EP REMOTE MONITORING  [facility] EP REMOTE MONITORING  [facility] REGION) [address]  [address] [place] [address]  [phone]   This is a remote appointment, you do not need to come into the office.                  I saw and evaluated the patient, participating in the key portions of the service on the day of discharge.?? I reviewed the resident???s note and agree with the discharge plans and disposition. I personally spent 20 minutes in discharge planning services. Christene Lye, MD

## 2018-11-13 ENCOUNTER — Ambulatory Visit
Admit: 2018-11-13 | Discharge: 2018-11-14 | Payer: MEDICARE | Attending: Student in an Organized Health Care Education/Training Program | Primary: Student in an Organized Health Care Education/Training Program

## 2018-11-13 ENCOUNTER — Ambulatory Visit: Admit: 2018-11-13 | Discharge: 2018-11-14 | Payer: MEDICARE | Attending: Internal Medicine | Primary: Internal Medicine

## 2018-11-13 DIAGNOSIS — N400 Enlarged prostate without lower urinary tract symptoms: Secondary | ICD-10-CM

## 2018-11-13 DIAGNOSIS — Z794 Long term (current) use of insulin: Secondary | ICD-10-CM

## 2018-11-13 DIAGNOSIS — E1122 Type 2 diabetes mellitus with diabetic chronic kidney disease: Secondary | ICD-10-CM

## 2018-11-13 DIAGNOSIS — D899 Disorder involving the immune mechanism, unspecified: Secondary | ICD-10-CM

## 2018-11-13 DIAGNOSIS — N183 Chronic kidney disease, stage 3 (moderate): Secondary | ICD-10-CM

## 2018-11-13 DIAGNOSIS — Z944 Liver transplant status: Secondary | ICD-10-CM

## 2018-11-13 DIAGNOSIS — I10 Essential (primary) hypertension: Secondary | ICD-10-CM

## 2018-11-13 DIAGNOSIS — R490 Dysphonia: Secondary | ICD-10-CM

## 2018-11-13 LAB — PROTEIN URINE: Protein:MCnc:Pt:Urine:Qn:: 11.5

## 2018-11-13 MED ORDER — SODIUM BICARBONATE 650 MG TABLET
ORAL_TABLET | Freq: Two times a day (BID) | ORAL | 6 refills | 50 days | Status: CP
Start: 2018-11-13 — End: 2019-11-13
  Filled 2018-11-15: qty 100, 50d supply, fill #0

## 2018-11-13 NOTE — Unmapped (Signed)
Otolaryngology     Reason for visit:  Follow up     History of Present Illness:     Recall, initial presentation on 01/30/17 Mr.Bohden Santa is a 68 y.o. 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 He spent 3 months in the hospital following his transplant he had respiratory compromise and ultimately had tracheostomy tube for much of that stay. He was decannulated in September before discharge home. 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  Patient reports he lost his voice following liver transplant episode 3 months ago. He states that since surgery his voice is breathy and weak. He cannot control his pitch. His voice symptoms do not fluctuate throughout the day and he has not noticed anything that makes it better or worse. He does have a cough throughout the day. He also endorses globus sensation. He denies dysphagia. He is maintained on an oral diet but has not had his G-tube removed yet due to surgeon preference. He has never had voice issues before and has never seen an ENT doctor for any related issues. He does endorse consistent shortness of breath with exertion and notes that this is likely secondary to his recovery process from his liver transplant. She does take Prilosec for his GERD. Thinks that this is stable. No coughing or choking while eating.    He was noted to have a right vocal fold paralysis with significant breathy voice and glottal insufficiency.   For multilpe reasons intervention was delayed.   He has had quite a complicated course.     He did have anemia and hemothorax.     He underwent in office injection cymetra on 05/31/17 0.4ml right fold.     Since then, he has had multiple set backs in his GI care. He had a fall and possible liver abscess. He underwent intubation and ERCP in April. He has been admitted to the hospital twice.     08/08/17: He returns today in follow-up.  He reports no significant improvement in the voice following injection.  He continues to be weak and breathy.  He is also had increasing coughing on foods and liquids.  No pneumonia.  He has had significant illnesses since his injection however as stated above.    He was taken back to [facility] on 09/13/17 for in office [facility] injection TTH cymetra 0.14ml.     10/03/17:   He returns after injection. The voice improved but by only about 50%. The voice has started to decline. He also complains of coughing while eating. This has not changed with injection augmentation. He does state he is louder and easier, but still weak from voice standpoint.    10.21.19:   Follows up for reassessment. Per wife, voice has remained stable since last visit. Improved compared to prior to injection but still with breathiness and vocal fatigue. He was unable to complete voice therpay. He had Hip fracture surgery recently and was intubated for this. Wife states still with some coughing after meals. No regurgitation after meals. Patient states imprpoved.     03/13/18: He comes in today for preop for voice surgery however he states his swallowing has become more of an issue.  He continues to get choked on solid foods and has to regurgitate to swallow things down.  He feels like this is getting worse and is currently a larger issue than the voice.     04/04/18: OR for endoscopic co2 laser zenker diverticulotomy and botox CP     04/17/18: he comes in today for postop. His postop swallow study showed no extravasation and was dishcarged home on pod 1 stable. He denies fevers, chest pain, tachycardia. He continues to have a cough.     09/24/18: he comes in today for follow up. Regarding his voice, he is still quite frustrated. It is weak and he has vocal fatigue. He is interested in permanent vocal rehab. Recall he is now off coumadin.     With regards to swallow, he is s/p endo co2 laser zenker diverticulotomy on 04/04/18. His wife still complains that he coughs when he eats. He also has dry cough when not eating. He is able to eat all consistencies of food. He had MBS on 05/15/18 that showed minimal residue in minimal residual zenker. He did have some backflow of solids and mild pharyngeal dysphagia.    11/05/18: he returns in fu. No improvement in cough with SLN injection. Still with breathy dysphonia. Here for preop for thyroplasty. No new issues. No URI, no chest pain.     11/07/2018: OR for medialization thyroplasty with silastic implant on right    11/13/2018:  He has been doing well since surgery.  Improved voice but still with some breathy dysphonia. No dysphagia. No swelling or erythema at site.       Past Medical History:  Past Medical History:   Diagnosis Date   ??? Atrial fibrillation (CMS-HCC)    ??? Basal cell carcinoma    ??? Cancer (CMS-HCC)    ??? Cirrhosis (CMS-HCC)    ??? Depression    ??? Diabetes (CMS-HCC)    ??? Headache    ??? Hepatitis C 07/17/2012   ??? History of transfusion    ??? Liver disease    ??? Low back pain 07/17/2012   ??? Varices, esophageal (CMS-HCC)        Past Surgical History:  Past Surgical History:   Procedure Laterality Date   ??? ANKLE SURGERY     ??? BACK SURGERY     ??? BACK SURGERY     ??? CARDIAC SURGERY      pacemaker   ??? CHG US GUIDE, TISSUE ABLATION N/A 01/22/2016    Procedure: ULTRASOUND GUIDANCE FOR, AND MONITORING OF, PARENCHYMAL TISSUE ABLATION;  Surgeon: Particia Nearing, MD;  Location: MAIN OR [facility];  Service: Transplant   ??? IR EMBOLIZATION ORGAN ISCHEMIA, TUMORS, INFAR  06/16/2016    IR EMBOLIZATION ORGAN ISCHEMIA, TUMORS, INFAR 06/16/2016 Ammie Dalton, MD IMG VIR H[facility]V [facility]   ??? PR COLON CA SCRN NOT HI RSK IND N/A 02/27/2015    Procedure: COLOREC CNCR SCR;COLNSCPY NO;  Surgeon: Vonda Antigua, MD;  Location: GI PROCEDURES MEMORIAL [facility];  Service: Gastroenterology   ??? PR ENDOSCOPIC ULTRASOUND EXAM N/A 02/27/2015    Procedure: UGI ENDO; W/ENDO ULTRASOUND EXAM INCLUDES ESOPHAGUS, STOMACH, &/OR DUODENUM/JEJUNUM;  Surgeon: Vonda Antigua, MD;  Location: GI PROCEDURES MEMORIAL [facility];  Service: Gastroenterology   ??? PR ERCP BALLOON DILATE BILIARY/PANC DUCT/AMPULLA EA N/A 02/10/2017    Procedure: ERCP;WITH TRANS-ENDOSCOPIC BALLOON DILATION OF BILIARY/PANCREATIC DUCT(S) OR OF AMPULLA, INCLUDING SPHINCTERECTOMY, WHEN PERFOREMD,EACH DUCT (40102);  Surgeon: Mayford Knife, MD;  Location: GI PROCEDURES MEMORIAL [facility];  Service: Gastroenterology   ??? PR ERCP REMOVE FOREIGN BODY/STENT BILIARY/PANC DUCT N/A 02/10/2017    Procedure: ENDOSCOPIC RETROGRADE  CHOLANGIOPANCREATOGRAPHY (ERCP); W/ REMOVAL OF FOREIGN BODY/STENT FROM BILIARY/PANCREATIC DUCT(S);  Surgeon: Mayford Knife, MD;  Location: GI PROCEDURES MEMORIAL [facility];  Service: Gastroenterology   ??? PR ERCP REMOVE FOREIGN BODY/STENT BILIARY/PANC DUCT N/A 06/29/2017    Procedure: ENDOSCOPIC RETROGRADE CHOLANGIOPANCREATOGRAPHY (ERCP); W/ REMOVAL OF FOREIGN BODY/STENT FROM BILIARY/PANCREATIC DUCT(S);  Surgeon: Vonda Antigua, MD;  Location: GI PROCEDURES MEMORIAL [facility];  Service: Gastroenterology   ??? PR ERCP STENT PLACEMENT BILIARY/PANCREATIC DUCT N/A 11/29/2016    Procedure: ENDOSCOPIC RETROGRADE CHOLANGIOPANCREATOGRAPHY (ERCP); WITH PLACEMENT OF ENDOSCOPIC STENT INTO BILIARY OR PANCREATIC DUCT;  Surgeon: Chriss Driver, MD;  Location: GI PROCEDURES MEMOR[facility];  Service: Gastroenterology   ??? PR ERCP STENT PLACEMENT BILIARY/PANCREATIC DUCT N/A 04/26/2017    Procedure: ENDOSCOPIC RETROGRADE CHOLANGIOPANCREATOGRAPHY (ERCP); WITH PLACEMENT OF ENDOSCOPIC STENT INTO BILIARY OR PANCREATIC DUCT;  Surgeon: Mayford Knife, MD;  Location: GI PROCEDURES MEMORIAL [facility];  Service: Gastroenterology   ??? PR ERCP,W/REMOVAL STONE,BIL/PANCR DUCTS N/A 11/29/2016    Procedure: ERCP; W/ENDOSCOPIC RETROGRADE REMOVAL OF CALCULUS/CALCULI FROM BILIARY &/OR PANCREATIC DUCTS;  Surgeon: Chriss Driver, MD;  Location: GI PROCEDURES MEMORIAL [facility];  Service: Gastroenterology   ??? PR ERCP,W/REMOVAL STONE,BIL/PANCR DUCTS N/A 02/10/2017    Procedure: ERCP; W/ENDOSCOPIC RETROGRADE REMOVAL OF CALCULUS/CALCULI FROM BILIARY &/OR PANCREATIC DUCTS;  Surgeon: Mayford Knife, MD;  Location: GI PROCEDURES MEMORIAL [facility];  Service: Gastroenterology   ??? PR ERCP,W/REMOVAL STONE,BIL/PANCR DUCTS N/A 04/26/2017    Procedure: ERCP; W/ENDOSCOPIC RETROGRADE REMOVAL OF CALCULUS/CALCULI FROM BILIARY &/OR PANCREATIC DUCTS;  Surgeon: Mayford Knife, MD;  Location: GI PROCEDURES MEMORIAL [facility];  Service: Gastroenterology   ??? PR ERCP,W/REMOVAL STONE,BIL/PANCR DUCTS N/A 06/29/2017    Procedure: ERCP; W/ENDOSCOPIC RETROGRADE REMOVAL OF CALCULUS/CALCULI FROM BILIARY &/OR PANCREATIC DUCTS;  Surgeon: Vonda Antigua, MD;  Location: GI PROCEDURES MEMORIAL [facility];  Service: Gastroenterology   ??? PR ESOPHAGOSCP RIG TRANSORAL HYPOPHARYNX CRV ESOPH N/A 04/04/2018    Procedure: ESOPHAGOSCOPY, RIGID, TRANSORAL WITH DIVERTICULECTOMY OF HYPOPHARYNX OR CERVICAL ESOPHAGUS, WITH CRICOPHARYNGEAL MYOTOMY, INCLUDES USE OF TELESCOPE OR OPERATING MICROSCOPE AND REPAIR, WHEN PERFORMED;  Surgeon: Vista Deck, MD;  Location: MAIN OR [facility];  Service: ENT   ??? PR INSER HEART TEMP PACER ONE CHMBR N/A 10/02/2016    Procedure: Tempoarary Pacemaker Insertion;  Surgeon: Meredith Leeds, MD;  Location: [facility];  Service: Cardiology   ??? PR LAP,DIAGNOSTIC ABDOMEN N/A 01/22/2016    Procedure: Laparoscopy, Abdomen, Peritoneum, & Omentum, Diagnostic, W/Wo Collection Specimen(S) By Brushing Or Washing;  Surgeon: Particia Nearing, MD;  Location: MAIN OR [facility];  Service: Transplant   ??? PR LARYNGOPLASTY MEDIALIZATION UNLIATERAL N/A 11/07/2018    Procedure: R31 LARYNGOPLASTY, MEDIALIZATION, UNILATERAL;  Surgeon: Vista Deck, MD;  Location: MAIN OR [facility];  Service: ENT   ??? PR PLACE PERCUT GASTROSTOMY TUBE N/A 11/17/2016    Procedure: UGI ENDO; W/DIRECTED PLCMT PERQ GASTROSTOMY TUBE;  Surgeon: Cletis Athens, MD;  Location: GI PROCEDURES MEMORIAL [facility];  Service: Gastroenterology   ??? PR TRACHEOSTOMY, PLANNED N/A 09/29/2016    Procedure: TRACHEOSTOMY PLANNED (SEPART PROC);  Surgeon: Katherina Mires, MD;  Location: MAIN OR [facility];  Service: Trauma   ??? PR TRANSCATH INSERT OR REPLACE LEADLESS PM VENTR N/A 10/11/2016    Procedure: Pacemaker Implant/Replace Leadless;  Surgeon: Meredith Leeds, MD;  Location: [facility];  Service: Cardiology   ??? PR TRANSPLANT LIVER,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    Procedure: Liver Allotransplantation; Orthotopic, Partial Or Whole, From Cadaver Or Living Donor, Any Age;  Surgeon: Doyce Loose, MD;  Location:  MAIN OR [facility];  Service: Transplant   ??? PR TRANSPLANT,PREP DONOR LIVER, WHOLE N/A 09/15/2016    Procedure: Rogelia Boga Std Prep Cad Donor Whole Liver Gft Prior Tnsplnt,Inc Chole,Diss/Rem Surr Tissu Wo Triseg/Lobe Splt;  Surgeon: Doyce Loose, MD;  Location: MAIN OR [facility];  Service: Transplant   ??? PR UPPER GI ENDOSCOPY,BIOPSY N/A 07/17/2012    Procedure: UGI ENDOSCOPY; WITH BIOPSY, SINGLE OR MULTIPLE;  Surgeon: Alba Destine, MD;  Location: GI PROCEDURES MEMORIAL [facility];  Service: Gastroenterology   ??? PR UPPER GI ENDOSCOPY,DIAGNOSIS N/A 02/04/2014    Procedure: UGI ENDO, INCLUDE ESOPHAGUS, STOMACH, & DUODENUM &/OR JEJUNUM; DX W/WO COLLECTION SPECIMN, BY BRUSH OR WASH;  Surgeon: Wilburt Finlay, MD;  Location: GI PROCEDURES MEMORIAL [facility]  Service: Gastroenterology   ??? PR UPPER GI ENDOSCOPY,LIGAT VARIX N/A 11/05/2013    Procedure: UGI ENDO; W/BAND LIG ESOPH &/OR GASTRIC VARICES;  Surgeon: Wilburt Finlay, MD;  Location: GI PROCEDURES MEMOR[facility];  Service: Gastroenterology       Medications:    Current Outpatient Medications:   ???  alendronate (FOSAMAX) 70 MG tablet, Take 1 tablet (70 mg total) by mouth every seven (7) days., Disp: 4 tablet, Rfl: 11  ???  bisacodyl (DULCOLAX, BISACODYL,) 5 mg EC tablet, Take 5 mg by mouth daily as needed. , Disp: , Rfl:   ???  blood sugar diagnostic Strp, Use three (3) times a day before meals., Disp: 100 each, Rfl: 7  ???  blood-glucose meter (GLUCOSE MONITORING KIT) kit, Use as instructed, Disp: 1 each, Rfl: 0  ???  calcium carbonate (OS-CAL) 500 mg calcium (1,250 mg) chewable tablet, Chew 2 tablets Two (2) times a day. , Disp: , Rfl:   ???  cephalexin (KEFLEX) 500 MG capsule, Take 1 capsule (500 mg total) by mouth Two (2) times a day for 10 days., Disp: 20 capsule, Rfl: 0  ???  cycloSPORINE modified (NEORAL) 100 MG capsule, Take 2 capsules (200 mg) by mouth in the morning and 1 capsule in the evening with three 25mg  capsules (For a total daily dose of 200mg  each morning and 175mg  every evening), Disp: 90 each, Rfl: 11  ???  cycloSPORINE modified (NEORAL) 25 MG capsule, Take 3 capsules (75mg ) by mouth every evening with a 100mg  capsule every evening (For a total dose of 175mg  every evening), Disp: 90 each, Rfl: 11  ???  glimepiride (AMARYL) 4 MG tablet, Take 4 mg by mouth., Disp: , Rfl:   ???  insulin glargine (BASAGLAR KWIKPEN U-100 INSULIN) 100 unit/mL (3 mL) injection pen, Inject 0.12 mL (12 Units total) under the skin nightly., Disp: 15 mL, Rfl: 12  ???  insulin lispro (HUMALOG KWIKPEN INSULIN) 100 unit/mL injection pen, Inject 4 Units under the skin Three (3) times a day before meals., Disp: 15 mL, Rfl: 12  ???  magnesium oxide-Mg AA chelate (MAGNESIUM, AMINO ACID CHELATE,) 133 mg Tab, Take 2 tablets by mouth Two (2) times a day., Disp: 100 tablet, Rfl: PRN  ???  melatonin 3 mg Tab, Take 1 tablet (3 mg total) by mouth nightly., Disp: 30 tablet, Rfl: 0  ???  mirtazapine (REMERON) 15 MG tablet, TAKE 1 TABLET BY MOUTH NIGHTLY, Disp: 90 tablet, Rfl: 3  ???  mycophenolate (CELLCEPT) 250 mg capsule, TAKE 2 CAPSULES (500MG ) BY MOUTH TWICE DAILY, Disp: 120 capsule, Rfl: 7  ???  oxyCODONE (ROXICODONE) 5 MG immediate release tablet, Take 1 tablet (5 mg total) by mouth every four (4) hours as needed for up to 5 days.,  Disp: 10 tablet, Rfl: 0  ???  pen needle, diabetic 32 gauge x 5/32 Ndle, USE WITH INSULIN PEN 4 TIMES DAILY AS DIRECTED, Disp: 100 each, Rfl: 1  ???  predniSONE (DELTASONE) 5 MG tablet, Take 4 tablets (20 mg) by mouth for one day, then take 3 tablets (15 mg) the next day, then take 2 tablets (10 mg) the next day, then take 1 tablet (5mg ) then stop, Disp: 10 tablet, Rfl: 0  ???  pregabalin (LYRICA) 75 MG capsule, Take 1 capsule (75 mg total) by mouth 2 (two) times daily (Patient taking differently: Take 75 mg by mouth daily as needed. ), Disp: 60 capsule, Rfl: 6  ???  sildenafiL, pulm.hypertension, (REVATIO) 20 mg tablet, TAKE 2 TABLETS (40 MG TOTAL) BY G-TUBE THREE TIMES DAILY, Disp: 180 tablet, Rfl: 6  ???  sodium bicarbonate 650 mg tablet, Take 1 tablet (650 mg total) by mouth two (2) times a day., Disp: 100 tablet, Rfl: 6     Allergies:  No Known Allergies    Family History:  Family History   Problem Relation Age of Onset   ??? Hypertension Mother    ??? Cancer Mother    ??? Heart disease Mother    ??? Heart disease Father    ??? Cirrhosis Neg Hx    ??? Liver cancer Neg Hx    ??? Anemia Neg Hx    ??? Diabetes Neg Hx    ??? Kidney disease Neg Hx    ??? Obesity Neg Hx    ??? Thyroid disease Neg Hx    ??? Osteoporosis Neg Hx    ??? Coronary artery disease Neg Hx    ??? Anesthesia problems Neg Hx    ??? Basal cell carcinoma Neg Hx    ??? Squamous cell carcinoma Neg Hx      Social History:  Social History     Tobacco Use   ??? Smoking status: Never Smoker   ??? Smokeless tobacco: Never Used   ??? Tobacco comment: Smoked in high school for about 5 years.    Substance Use Topics   ??? Alcohol use: No   ??? Drug use: No       Review of Systems:  Denies chest pain, sob, fevers, n/v, abd pain      Physical Exam:   Constitutional:  Vitals reviewed in chart, patient has normal appearance. Well nourished, well-developed, no acute distress  Voice:  Hoarse, breathy voice, improved with resonant voice  Respiration:  Breathing comfortably, no stridor.  CV: No clubbing/cyanosis/edema in hands.   Eyes:  extraocular motion intact, sclera normal.   Neuro:  Alert and oriented times 3, Cranial nerves 2-12 intact and symmetric bilaterally. Except X.  Head and Face:  Skin with no masses or lesions, sinuses nontender to palpation, facial nerve fully intact.   Salivary Glands:  Parotid and submandibular glands normal bilaterally.   Ears:  Normal external ears  Nose:  External nose midline, anterior rhinoscopy is normal with limited visualization just to the anterior interior turbinate.   Oral Cavity/Oropharynx/Lips:  Normal mucous membranes, normal floor of mouth/tongue/oropharynx, no masses or lesions are noted.  Good dentition  Pharyngeal Walls:  No masses noted.  Neck/Lymph:  No lymphadenopathy, no thyroid masses. Previous tracheostoma scar well healed. Incision from medialization thyroplasty is c/d/i, healing well. Flat, no erythema, no swelling.   Larynx: Mirror evaluation insufficient to visualize secondary to prominent gag    Procedure Note    Endoscopy Type:  Flexible Fiberoptic Videostroboscopy  Indications/TimeOut:  To better evaluate the patient???s symptoms, fiberoptic videostroboscopy is indicated.   A time out identifying the patient, the procedure, the location of the procedure and any concerns was performed prior to beginning the procedure.    Procedure Details:    The patient was placed in the sitting position.  After topical anesthesia and decongestion with oxymetazoline and 1% lidocaine, the flexible laryngoscope was passed.  The microphone was used to trigger the xenon stroboscopic light source.    -  Nasal cavity  - There was no pus or polyps noted in the nasal cavity. Septum intact  -  Nasopharynx - there was no evidence of mass or lesion.  -  Hypopharynx - There were no lesions in the pyriformis, epiglottis, or base of tongue.  -  Larynx -  there was mild interarytenoid edema, no erythema.  -  Vocal Folds -     Mobility:right vocal fold hypomobility    Amplitude: symmetric   Mucosal Wave: Mucosal wave intact bilaterally   Closure: Complete   Lesions/Other findings: appropriate medialization noted on right. Mild yellow hue in right fold with mild edema.     Condition:  Stable.  Patient tolerated procedure well.    Complications:  None          Assessment:   [name] is a 68 y.o. 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  He  has a past medical history of Atrial fibrillation (CMS-HCC), Basal cell carcinoma, Cancer (CMS-HCC), Cirrhosis (CMS-HCC), Depression, Diabetes (CMS-HCC), Headache, Hepatitis C (07/17/2012), History of transfusion, Liver disease, Low back pain (07/17/2012), and Varices, esophageal (CMS-HCC).    Findings:   dypsphonia  right vocal fold immobility   Glottic insufficiency   --all temporally related to liver transplant transplant. (prolonged intubation, trach possible etiologies)   05/31/17 Status post in office injection augmentation with Cymetra in the [facility]   Significantly complicated history surrounding injection including hemothorax and anemia prior to injection followed by readmission x2 for fall rib pain and intubation for ERCP.  S/p [facility] on 09/13/17 for in office [facility] injection TTH cymetra 0.30ml.   Small Zenker diverticulum, mild pharyngeal dysphagia  S/p endoscopic co2 laser zenker diverticulotomy and botox injection CP 04/04/18   S/p medialization thyroplasty with silastic on right 11/07/18    Plan:  Doing well. Still with some functional dysphonia. Excellent medializtion.   Fu 3 weeks. Will initiate Speech therapy which is necessary and standard of care at that time.     I saw and evaluated the patient, participating in the key portions of the E/M service.?? I discussed the findings, assessment and plan with the resident and agree with resident???s findings and plan as documented in the resident???s note.?? I was present for the entirety of the procedure(s). Christene Lye, MD

## 2018-11-13 NOTE — Unmapped (Signed)
Flexible endoscope serial number [id number] used today by Martina Sinner, MD

## 2018-11-13 NOTE — Unmapped (Signed)
-   Please go to [facility] prior your appointment with me in 3 months so that we can review the results at follow-up.

## 2018-11-13 NOTE — Unmapped (Signed)
Referring Provider: Huey Bienenstock*     PCP: [name], MD    11/15/2018    Chief Complaint: Chronic Kidney Disease    HPI: [name] is a/an 68 y.o. 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 In review of the patient's medical record, the patient's creatinine was 1.3 in July 2018 and found to be 2.0 in August 2020. He was diagnosed with 2017 during a hospital admission, but likely had this for some time. He does have a history of retinopathy or neuropathy. He denies a family history of kidney disease and denies a personal history of frequent urinary tract infections or excessive NSAID. Does report a remote history of nephrolithiasis.    Today, he feels well. He was recently admitted to [facility] in late August for a scheduled laryngoplasty for right vocal fold paralysis. Otherwise, he denies fevers, chills, headaches, chest pain, nausea, vomiting, abdominal pain, dysuria, hematuria, ulcers, syncope or seizures. He does report occasional shortness of breath which is chronic and has not changed in quality. Intermittently he has loose stools which is also chronic in nature. His blood glucose values average in the ~170 range without any overt hypoglycemia. Reports adherence to his immunosuppressive regimen.      ROS: 11 systems reviewed and negative except those noted in the history of present illness    PAST MEDICAL HISTORY:  Past Medical History:   Diagnosis Date   ??? Atrial fibrillation (CMS-HCC)    ??? Basal cell carcinoma    ??? Cancer (CMS-HCC)    ??? Cirrhosis (CMS-HCC)    ??? Depression    ??? Diabetes (CMS-HCC)    ??? Headache    ??? Hepatitis C 07/17/2012   ??? History of transfusion    ??? Liver disease    ??? Low back pain 07/17/2012   ??? Varices, esophageal (CMS-HCC)        ALLERGIES  Patient has no known allergies.    SOCIAL HISTORY  Social History     Socioeconomic History   ??? Marital status: Married     Spouse name: Not on file   ??? Number of children: Not on file   ??? Years of education: Not on file   ??? Highest education level: Not on file   Occupational History   ??? Not on file   Social Needs   ??? Financial resource strain: Not on file   ??? Food insecurity     Worry: Not on file     Inability: Not on file   ??? Transportation needs     Medical: Not on file     Non-medical: Not on file   Tobacco Use   ??? Smoking status: Never Smoker   ??? Smokeless tobacco: Never Used   ??? Tobacco comment: Smoked in high school for about 5 years.    Substance and Sexual Activity   ??? Alcohol use: No   ??? Drug use: No   ??? Sexual activity: Yes     Partners: Female     Birth control/protection: None   Lifestyle   ??? Physical activity     Days per week: Not on file     Minutes per session: Not on file   ??? Stress: Not on file   Relationships   ??? Social  connections     Talks on phone: Not on file     Gets together: Not on file     Attends religious service: Not on file     Active member of club or organization: Not on file     Attends meetings of clubs or organizations: Not on file     Relationship status: Not on file   Other Topics Concern   ??? Do you use sunscreen? Not Asked   ??? Tanning bed use? Not Asked   ??? Are you easily burned? Not Asked   ??? Excessive sun exposure? Not Asked   ??? Blistering sunburns? Not Asked   ??? Exercise No   ??? Living Situation No   Social History Narrative   ??? Not on file         FAMILY HISTORY  Family History   Problem Relation Age of Onset   ??? Hypertension Mother    ??? Cancer Mother    ??? Heart disease Mother    ??? Heart disease Father    ??? Cirrhosis Neg Hx    ??? Liver cancer Neg Hx    ??? Anemia Neg Hx    ??? Diabetes Neg Hx    ??? Kidney disease Neg Hx    ??? Obesity Neg Hx    ??? Thyroid disease Neg Hx    ??? Osteoporosis Neg Hx    ??? Coronary artery disease Neg Hx    ??? Anesthesia problems Neg Hx    ??? Basal cell carcinoma Neg Hx ??? Squamous cell carcinoma Neg Hx         MEDICATIONS:  Current Outpatient Medications   Medication Sig Dispense Refill   ??? alendronate (FOSAMAX) 70 MG tablet Take 1 tablet (70 mg total) by mouth every seven (7) days. 4 tablet 11   ??? bisacodyl (DULCOLAX, BISACODYL,) 5 mg EC tablet Take 5 mg by mouth daily as needed.      ??? blood sugar diagnostic Strp Use three (3) times a day before meals. 100 each 7   ??? blood-glucose meter (GLUCOSE MONITORING KIT) kit Use as instructed 1 each 0   ??? calcium carbonate (OS-CAL) 500 mg calcium (1,250 mg) chewable tablet Chew 2 tablets Two (2) times a day.      ??? cephalexin (KEFLEX) 500 MG capsule Take 1 capsule (500 mg total) by mouth Two (2) times a day for 10 days. 20 capsule 0   ??? cycloSPORINE modified (NEORAL) 100 MG capsule Take 2 capsules (200 mg) by mouth in the morning and 1 capsule in the evening with three 25mg  capsules (For a total daily dose of 200mg  each morning and 175mg  every evening) 90 each 11   ??? cycloSPORINE modified (NEORAL) 25 MG capsule Take 3 capsules (75mg ) by mouth every evening with a 100mg  capsule every evening (For a total dose of 175mg  every evening) 90 each 11   ??? glimepiride (AMARYL) 4 MG tablet Take 4 mg by mouth.     ??? insulin glargine (BASAGLAR KWIKPEN U-100 INSULIN) 100 unit/mL (3 mL) injection pen Inject 0.12 mL (12 Units total) under the skin nightly. 15 mL 12   ??? insulin lispro (HUMALOG KWIKPEN INSULIN) 100 unit/mL injection pen Inject 4 Units under the skin Three (3) times a day before meals. 15 mL 12   ??? magnesium oxide-Mg AA chelate (MAGNESIUM, AMINO ACID CHELATE,) 133 mg Tab Take 2 tablets by mouth Two (2) times a day. 100 tablet PRN   ??? melatonin 3 mg Tab  Take 1 tablet (3 mg total) by mouth nightly. 30 tablet 0   ??? mirtazapine (REMERON) 15 MG tablet TAKE 1 TABLET BY MOUTH NIGHTLY 90 tablet 3   ??? mycophenolate (CELLCEPT) 250 mg capsule TAKE 2 CAPSULES (500MG ) BY MOUTH TWICE DAILY 120 capsule 7   ??? pen needle, diabetic 32 gauge x 5/32 Ndle USE WITH INSULIN PEN 4 TIMES DAILY AS DIRECTED 100 each 1   ??? predniSONE (DELTASONE) 5 MG tablet Take 4 tablets (20 mg) by mouth for one day, then take 3 tablets (15 mg) the next day, then take 2 tablets (10 mg) the next day, then take 1 tablet (5mg ) then stop 10 tablet 0   ??? pregabalin (LYRICA) 75 MG capsule Take 1 capsule (75 mg total) by mouth 2 (two) times daily (Patient taking differently: Take 75 mg by mouth daily as needed. ) 60 capsule 6   ??? sildenafiL, pulm.hypertension, (REVATIO) 20 mg tablet TAKE 2 TABLETS (40 MG TOTAL) BY G-TUBE THREE TIMES DAILY 180 tablet 6   ??? sodium bicarbonate 650 mg tablet Take 1 tablet (650 mg total) by mouth two (2) times a day. 100 tablet 6     No current facility-administered medications for this visit.        PHYSICAL EXAM:     Vitals:    11/13/18 1038   BP: 124/76   Pulse: 72   Resp: 16   Temp: 36.6 ??C (97.9 ??F)     CONSTITUTIONAL: Alert, no distress  HEENT: Moist mucous membranes, oropharynx clear without erythema or exudate  EYES: Extra ocular movements intact. Pupils reactive, sclerae anicteric.  NECK: Supple, no lymphadenopathy  CARDIOVASCULAR: Regular, normal S1/S2 heart sounds, no murmurs, no rubs.   PULM: Clear to auscultation bilaterally  GASTROINTESTINAL: Soft, active bowel sounds, nontender  EXTREMITIES: Trace lower extremity edema bilaterally.   SKIN: No rashes or lesions  NEUROLOGIC: Uses cane for ambulation   PSYCH: Alert and oriented x 3    MEDICAL DECISION MAKING    Results for orders placed or performed in visit on 11/13/18   Protein/Creatinine Ratio, Urine   Result Value Ref Range    Creat U 144.7 Undefined mg/dL    Protein, Ur 16.1 Undefined mg/dL    Protein/Creatinine Ratio, Urine 0.079 Undefined   POCT Urinalysis Dipstick   Result Value Ref Range    Spec Gravity/POC 1.025 1.003 - 1.030    PH/POC 5.5 5.0 - 9.0    Leuk Esterase/POC Negative Negative    Nitrite/POC Negative Negative    Protein/POC Negative Negative    UA Glucose/POC Trace (A) Negative Ketones, POC Negative Negative    Bilirubin/POC Negative Negative    Blood/POC Trace-intact (A) Negative    Urobilinogen/POC 0.2 0.2 - 1.0 mg/dL     *Note: Due to a large number of results and/or encounters for the requested time period, some results have not been displayed. A complete set of results can be found in Results Review.        Creatinine Whole Blood, POC   Date Value Ref Range Status   09/25/2015 1.0 0.8 - 1.4 mg/dL Final   09/60/4540 0.9 0.8 - 1.4 mg/dL Final     Creatinine   Date Value Ref Range Status   11/08/2018 2.06 (H) 0.70 - 1.30 mg/dL Final   98/01/9146 8.29 (H) 0.70 - 1.30 mg/dL Final   56/21/3086 5.78 (H) 0.70 - 1.30 mg/dL Final   46/96/2952 8.41 (H) mg/dL Final   32/44/0102 7.25 (H) mg/dL Final  01/09/2014 0.87 0.70 - 1.30 mg/dL Final   16/12/9602 5.40 0.70 - 1.30 mg/dL Final   98/01/9146 8.29 0.70 - 1.30 mg/dL Final   56/21/3086 5.78 0.70 - 1.30 mg/dL Final   46/96/2952 8.41 0.70 - 1.30 mg/dL Final     EST.GFR (MDRD)   Date Value Ref Range Status   02/15/2012 >= 60 >=60 mL/min/1.97m2 Final   08/17/2011 >= 60 >=60 mL/min/1.90m2 Final   04/20/2011 >= 60 >=60 mL/min/1.41m2 Final   11/10/2010 >= 60 >=60 mL/min/1.73m2 Final   07/21/2010 >= 60 >=60 mL/min/1.8m2 Final      BUN   Date Value Ref Range Status   11/08/2018 47 (H) 7 - 21 mg/dL Final   32/44/0102 41 (H) 7 - 21 mg/dL Final   72/53/6644 37 (H) 7 - 21 mg/dL Final   03/47/4259 34 (H) mg/dL Final   56/38/7564 41 (H) mg/dL Final   33/29/5188 42 (H) 7 - 21 mg/dL Final   41/66/0630 43 (H) 7 - 21 mg/dL Final   16/03/930 30 (H) mg/dL Final   35/57/3220 12 7 - 21 mg/dL Final   25/42/7062 14 7 - 21 mg/dL Final      Phosphorus   Date Value Ref Range Status   10/01/2018 3.5 2.9 - 4.7 mg/dL Final   37/62/8315 3.0 2.9 - 4.7 mg/dL Final   17/61/6073 3.5 2.9 - 4.7 mg/dL Final   71/08/2692 2.9 2.9 - 4.7 mg/dL Final   85/46/2703 3.0 2.9 - 4.7 mg/dL Final      Calcium   Date Value Ref Range Status   11/08/2018 9.5 8.5 - 10.2 mg/dL Final   50/11/3816 9.9 8.5 - 10.2 mg/dL Final   29/93/7169 9.7 8.5 - 10.2 mg/dL Final   67/89/3810 17.5 mg/dL Final   01/05/8526 9.2 mg/dL Final   78/24/2353 9.3 8.5 - 10.2 mg/dL Final   61/44/3154 9.6 8.5 - 10.2 mg/dL Final   00/86/7619 9.5 8.5 - 10.2 mg/dL Final   50/93/2671 8.6 8.5 - 10.2 MG/DL Final   24/58/0998 8.7 8.5 - 10.2 MG/DL Final      No results found for: PTH   HGB   Date Value Ref Range Status   11/05/2018 12.1 (L) 13.5 - 17.5 g/dL Final   33/82/5053 97.6 (L) 13.5 - 17.5 g/dL Final   73/41/9379 02.4 (L) g/dL Final   09/73/5329 92.4 (L) g/dL Final   26/83/4196 22.2 (L) 13.5 - 17.5 g/dL Final     Hemoglobin   Date Value Ref Range Status   09/26/2016 6.2 (L) 13.5 - 17.5 g/dL Final     Comment:     Point of Care Testing performed at the point of care by trained personnel per documented policies.   09/18/2016 9.7 (L) 13.5 - 17.5 g/dL Final     Comment:     Point of Care Testing performed at the point of care by trained personnel per documented policies.   09/18/2016 9.6 (L) 13.5 - 17.5 g/dL Final     Comment:     Point of Care Testing performed at the point of care by trained personnel per documented policies.   09/15/2016 9.8 (L) 13.5 - 17.5 g/dL Final     Comment:     Point of Care Testing performed at the point of care by trained personnel per documented policies.   09/15/2016 9.1 (L) 13.5 - 17.5 g/dL Final     Comment:     Point of Care Testing performed at the point of care by trained  personnel per documented policies.      CO2   Date Value Ref Range Status   11/08/2018 20.0 (L) 22.0 - 30.0 mmol/L Final   11/07/2018 20.0 (L) 22.0 - 30.0 mmol/L Final   10/01/2018 21.0 (L) 22.0 - 30.0 mmol/L Final   09/25/2018 254.0 mmol/L Final   08/27/2018 22.3 mmol/L Final   10/10/2013 29 22 - 30 mmol/L Final   04/03/2013 28 22 - 30 mmol/L Final   07/25/2012 30 22 - 30 mmol/L Final   05/13/2010 30 22 - 30 MMOL/L Final         I have reviewed relevant outside healthcare records    Urine Sediment: Bland sediment, no formed cellular elements IMAGING STUDIES:   04/2018 MR Abdomen with and without contrast:  LOWER CHEST:   The visualized portions of the lower chest are unremarkable.  ABDOMEN/PELVIS:  HEPATOBILIARY:  Patient is status-post liver transplantation. Several peripherally enhancing lesions are again seen within hepatic segments II and VIII, which are stable-to-slightly decreased in size. For reference:  - Slightly decreased size of largest hepatic segment VIII lesion (13:24) which measures 1.7 x 2.1 cm (transverse by AP). This lesion previously measured 2.3 x 2.1 cm.  No new focal hepatic lesions are identified. Gallbladder surgically absent. Mild intrahepatic biliary ductal dilatation is noted, which is improved from prior exam and thought secondary to postcholecystectomy state. No obstructing stone or mass lesion is identified.  PANCREAS: Pancreas is atrophic. Multiple nonenhancing cystic lesions are seen scattered throughout the pancreatic head and body, similar to prior. No new focal mass. No ductal dilatation.  SPLEEN: No focal parenchymal abnormality.  ADRENAL GLANDS: Unremarkable.  KIDNEYS/URETERS: Symmetric nephrograms. No enhancing renal mass. No hydronephrosis.  BOWEL/PERITONEUM/RETROPERITONEUM: No bowel obstruction. No acute inflammatory process. No free fluid.  VASCULATURE: Abdominal aorta within normal limits for patient's age. Unremarkable IVC. The portal, splenic, and superior mesenteric veins are patent.   LYMPH NODES: Previously described enlarged porta hepatis lymph node is no longer well-visualized. No new or enlarging lymphadenopathy is identified within the abdomen.  BONES/SOFT TISSUES: Posterior spinal fixation hardware with associated artifact somewhat limits evaluation of the adjacent osseous structures and soft tissues.   Gynecomastia. No soft tissue abnormality. No worrisome osseous lesion identified.  IMPRESSION:  - Unchanged to slightly decreased size of hepatic abscesses in segments 2 and 8. No new hepatic lesions identified.  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    ASSESSMENT/PLAN:  [name] is a/an 68 y.o. 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   1. Chronic Kidney Disease Stage III: The patient's renal dysfunction is most likely secondary to diabetic nephropathy and prior fluid shifts in the post-transplant requiring CRRT to manage pulmonary hypertension/fluid overload. His creatinine has ranged from 1.6-2 over the past year, most recently 2.0. Urine sediment unremarkable, no significant hematuria, proteinuria or casts. UPCR 0.08g. Recent MR in February 2020 with unremarkable renal imaging. He has had issues with hyperkalemia and mildly low bicarbonate levels, will start sodium bicarbonate 650mg  BID. Since he just had labs drawn, will obtain at [facility] prior to follow-up appointment.  The patient was counseled on avoiding nephrotoxic agents including but not limited to NSAIDs and contrasted studies.    2. Type 2 Diabetes: Most recent A1c 6.8%, followed by [facility].    3. S/p Liver Transplant: Liver enzymes stable, platelets stable on 97. Currently  on Cyclosporine, Cellcept, and Prednisone.   4. Atrial Fibrillation: Rate and rhythm controlled.   5. Osteoporosis: Will need to monitor GFR closely while on Fosamax.     > 50% of this 60 minute visit was spent in explaining diagnoses, answering questions, presenting treatment options and discussing the treatment plan as described above.     [name] will follow up in 3 months

## 2018-11-15 MED FILL — SODIUM BICARBONATE 650 MG TABLET: 50 days supply | Qty: 100 | Fill #0 | Status: AC

## 2018-11-20 NOTE — Unmapped (Signed)
[facility] Refill Coordination Note    Specialty Medication(s) to be Shipped:   Transplant: mycophenolate mofetil 250mg , cyclosporine 25mg  and cyclosporine 100mg     Other medication(s) to be shipped: Mirtazapine 15mg , sildenafil 20mg , and magnesium 133mg      Jeffrey Ward, DOB: June 18, 1950  Phone: [phone] (home) [phone] (work)      All above HIPAA information was verified with patient's caregiver. (Wife)    Completed refill call assessment today to schedule patient's medication shipment from the [facility] [phone]).       Specialty medication(s) and dose(s) confirmed: Regimen is correct and unchanged.   Changes to medications: Jeffrey Ward reports no changes at this time.  Changes to insurance: No  Questions for the pharmacist: No    Confirmed patient received Welcome Packet with first shipment. The patient will receive a drug information handout for each medication shipped and additional FDA Medication Guides as required.       DISEASE/MEDICATION-SPECIFIC INFORMATION        N/A    SPECIALTY MEDICATION ADHERENCE     Medication Adherence    Patient reported X missed doses in the last month: 0  Specialty Medication: mycophenolate 250mg   Patient is on additional specialty medications: Yes  Additional Specialty Medications: Cyclosporine 25mg   Patient Reported Additional Medication X Missed Doses in the Last Month: 0  Patient is on more than two specialty medications: Yes  Specialty Medication: Cyclosporine 100mg   Patient Reported Additional Medication X Missed Doses in the Last Month: 0  Support network for adherence: family member          Mycophenolate 250 mg: 7 days of medicine on hand   Cyclosporine 25 mg: 7 days of medicine on hand   Cyclosporine 100 mg: 7 days of medicine on hand     SHIPPING     Shipping address confirmed in Epic.     Delivery Scheduled: Yes, Expected medication delivery date: 11/26/2018.     Medication will be delivered via UPS to the home address in Epic [place].    Jeffrey Ward   [facility] Specialty Technician

## 2018-11-22 NOTE — Unmapped (Signed)
Called [facility] at [phone] to schedule MRI for pt with pacemaker, unable to reach [facility], left message requesting a call back a [phone].

## 2018-11-23 MED FILL — CYCLOSPORINE MODIFIED 25 MG CAPSULE: 30 days supply | Qty: 90 | Fill #5 | Status: AC

## 2018-11-23 MED FILL — CYCLOSPORINE MODIFIED 100 MG CAPSULE: 30 days supply | Qty: 90 | Fill #5

## 2018-11-23 MED FILL — CYCLOSPORINE MODIFIED 100 MG CAPSULE: 30 days supply | Qty: 90 | Fill #5 | Status: AC

## 2018-11-23 MED FILL — MYCOPHENOLATE MOFETIL 250 MG CAPSULE: 30 days supply | Qty: 120 | Fill #7 | Status: AC

## 2018-11-23 MED FILL — MYCOPHENOLATE MOFETIL 250 MG CAPSULE: 30 days supply | Qty: 120 | Fill #7

## 2018-11-23 MED FILL — MG-PLUS-PROTEIN 133 MG TABLET: 25 days supply | Qty: 100 | Fill #8 | Status: AC

## 2018-11-23 MED FILL — SILDENAFIL (PULMONARY HYPERTENSION) 20 MG TABLET: 30 days supply | Qty: 180 | Fill #5 | Status: AC

## 2018-11-23 MED FILL — CYCLOSPORINE MODIFIED 25 MG CAPSULE: 30 days supply | Qty: 90 | Fill #5

## 2018-11-23 MED FILL — SILDENAFIL (PULMONARY HYPERTENSION) 20 MG TABLET: 30 days supply | Qty: 180 | Fill #5

## 2018-11-23 MED FILL — MG-PLUS-PROTEIN 133 MG TABLET: ORAL | 25 days supply | Qty: 100 | Fill #8

## 2018-11-27 ENCOUNTER — Institutional Professional Consult (permissible substitution): Admit: 2018-11-27 | Discharge: 2018-11-28 | Payer: MEDICARE

## 2018-11-27 DIAGNOSIS — Z45018 Encounter for adjustment and management of other part of cardiac pacemaker: Secondary | ICD-10-CM

## 2018-11-27 DIAGNOSIS — R001 Bradycardia, unspecified: Secondary | ICD-10-CM

## 2018-11-27 NOTE — Unmapped (Signed)
Cardiac Implantable Electronic Device Remote Monitoring     Visit Date:  11/27/2018    Findings: Normal device function, Tested Lead measurements stable and within normal range, Adequate battery reserve->8 years    Arrhythmias: No significant new atrial or ventricular arrhythmias    Plan: Continue Routine Remote Monitoring   __________________________________________________________________    Manufacturer of Device: Medtronic       Type of Device: Single Chamber Pacemaker  See scanned/downloaded PDF report for model numbers, serial numbers, and date(s) of implant.    Presenting Rhythm: VS    ______________________________________________________________________  Percentage Biventricular Pacing: n/a    Heart Failure Monitoring: Not Applicable  ______________________________________________________________________      Please see downloaded PDF file of transmission under Media Tab  for full details of device interrogation to include, when applicable,  battery status/charge time, lead trend data, and programmed parameters.

## 2018-12-05 MED FILL — UNIFINE PENTIPS 32 GAUGE X 5/32" NEEDLE (4 MM): 25 days supply | Qty: 100 | Fill #0

## 2018-12-05 MED FILL — UNIFINE PENTIPS 32 GAUGE X 5/32" NEEDLE: 25 days supply | Qty: 100 | Fill #0 | Status: AC

## 2018-12-13 ENCOUNTER — Institutional Professional Consult (permissible substitution): Admit: 2018-12-13 | Discharge: 2018-12-14 | Payer: MEDICARE

## 2018-12-13 NOTE — Unmapped (Signed)
Called [facility] ( pacemaker MRI scheduling) at [phone] to discuss scheduling patient for MRI. Daron Offer said he will send email to nurse for EP device check and will call patient to be scheduled. Informed [facility] I will call patient to let them know to expect a call from him.     Called patient preferred number, spoke with wife. Informed wife someone from MRI scheduling will call to schedule them for MRI, its part of patients liver txp annuals. Informed wife I will call after MRI is scheduled to plug in provider appointment. Wife stated verbal understanding.

## 2018-12-14 NOTE — Unmapped (Addendum)
Patient is approved for MRI.  Patient has a  conditional device    GENERATOR INFORMATION  Health visitor #    Medtronic Micra leadless     LEAD INFORMATION  Abandoned/Epicardial Lead(s)  No   CXR verified                Prescreening Requirements  *The patient has no implanted lead extenders, lead adaptors, or abandoned leads  *The patient has no broken leads or leads with intermittent electrical contact, as confirmed by lead impedance history >200 ohms and <1500 ohms  *Implanted for more than 6 weeks-unless EP Attending approval  *Device is operating within the projected service life-Cannot be done if EOS, ERI, near ERI without Ep Attending approval  *For patients whose device will be programmed to an asynchronous pacing mode when the MRI SureScan mode is programmed to On, no diaphragmatic stimulation is present when the paced leads have a pacing output of 5.0 V and a pulse width of 1.0 ms.  *Pacing capture thresholds ? 2.0 V at 0.4 ms-if dependent (pacing left on)        MR CONDITIONAL STATUS AND MANAGEMENT      MR CONDITIONAL SYSTEM  Risks and Potential Complications discussed with patient and patient consents to procedure  The following conditions have been met:  No fractured, epicardial, or abandoned leads  MR is the best test for condition  Patient is not pacemaker Dependent    Device will be programmed under the direction of the Ep Attending by the Ep Device RN

## 2018-12-20 DIAGNOSIS — Z79899 Other long term (current) drug therapy: Secondary | ICD-10-CM

## 2018-12-20 DIAGNOSIS — Z944 Liver transplant status: Secondary | ICD-10-CM

## 2018-12-20 NOTE — Unmapped (Signed)
[facility] Refill Coordination Note    Specialty Medication(s) to be Shipped:   Transplant: mycophenolate mofetil 250mg , cyclosporine 25mg  and cyclosporine 100mg     Other medication(s) to be shipped: Alendronate 70mg , magnesium 133mg , mirtazapine 15mg , pregabalin 75mg , sildenafil 20mg      Jeffrey Ward, DOB: [birth year]  Phone: [phone] (home) [phone] (work)      All above HIPAA information was verified with patient.     Completed refill call assessment today to schedule patient's medication shipment from the [facility] [phone]).       Specialty medication(s) and dose(s) confirmed: Regimen is correct and unchanged.   Changes to medications: Man reports no changes at this time.  Changes to insurance: No  Questions for the pharmacist: No    Confirmed patient received Welcome Packet with first shipment. The patient will receive a drug information handout for each medication shipped and additional FDA Medication Guides as required.       DISEASE/MEDICATION-SPECIFIC INFORMATION        N/A    SPECIALTY MEDICATION ADHERENCE     Medication Adherence    Patient reported X missed doses in the last month: 0  Specialty Medication: Mycophenolate 250mg   Patient is on additional specialty medications: Yes  Additional Specialty Medications: Cyclosporine 25mg   Patient Reported Additional Medication X Missed Doses in the Last Month: 0  Patient is on more than two specialty medications: Yes  Specialty Medication: Cyclosporine 100mg   Patient Reported Additional Medication X Missed Doses in the Last Month: 0  Support network for adherence: family member          Mycophenolate 250 mg: 8 days of medicine on hand   Cyclosporine 25 mg: 8 days of medicine on hand   Cyclosporine 100 mg: 8 days of medicine on hand      SHIPPING     Shipping address confirmed in Epic.     Delivery Scheduled: Yes, Expected medication delivery date: 12/25/2018.  However, Rx request for refills was sent to the provider as there are none remaining. (mycophenolate)     Medication will be delivered via UPS to the prescription address in Epic [place].    [name]   [facility] Specialty Technician

## 2018-12-21 MED ORDER — MYCOPHENOLATE MOFETIL 250 MG CAPSULE
ORAL_CAPSULE | Freq: Two times a day (BID) | ORAL | 7 refills | 30.00000 days | Status: CP
Start: 2018-12-21 — End: ?
  Filled 2018-12-24: qty 120, 30d supply, fill #0

## 2018-12-24 DIAGNOSIS — I4891 Unspecified atrial fibrillation: Principal | ICD-10-CM

## 2018-12-24 MED FILL — ALENDRONATE 70 MG TABLET: ORAL | 28 days supply | Qty: 4 | Fill #8

## 2018-12-24 MED FILL — PREGABALIN 75 MG CAPSULE: 30 days supply | Qty: 60 | Fill #1

## 2018-12-24 MED FILL — MG-PLUS-PROTEIN 133 MG TABLET: 25 days supply | Qty: 100 | Fill #9 | Status: AC

## 2018-12-24 MED FILL — ALENDRONATE 70 MG TABLET: 28 days supply | Qty: 4 | Fill #8 | Status: AC

## 2018-12-24 MED FILL — MIRTAZAPINE 15 MG TABLET: 90 days supply | Qty: 90 | Fill #1 | Status: AC

## 2018-12-24 MED FILL — CYCLOSPORINE MODIFIED 25 MG CAPSULE: 30 days supply | Qty: 90 | Fill #6

## 2018-12-24 MED FILL — CYCLOSPORINE MODIFIED 100 MG CAPSULE: 30 days supply | Qty: 90 | Fill #6

## 2018-12-24 MED FILL — PREGABALIN 75 MG CAPSULE: 30 days supply | Qty: 60 | Fill #1 | Status: AC

## 2018-12-24 MED FILL — MYCOPHENOLATE MOFETIL 250 MG CAPSULE: 30 days supply | Qty: 120 | Fill #0 | Status: AC

## 2018-12-24 MED FILL — CYCLOSPORINE MODIFIED 25 MG CAPSULE: 30 days supply | Qty: 90 | Fill #6 | Status: AC

## 2018-12-24 MED FILL — MG-PLUS-PROTEIN 133 MG TABLET: ORAL | 25 days supply | Qty: 100 | Fill #9

## 2018-12-24 MED FILL — CYCLOSPORINE MODIFIED 100 MG CAPSULE: 30 days supply | Qty: 90 | Fill #6 | Status: AC

## 2018-12-24 MED FILL — MIRTAZAPINE 15 MG TABLET: ORAL | 90 days supply | Qty: 90 | Fill #1

## 2018-12-26 MED FILL — SILDENAFIL (PULMONARY HYPERTENSION) 20 MG TABLET: 30 days supply | Qty: 180 | Fill #6

## 2018-12-26 MED FILL — SILDENAFIL (PULMONARY HYPERTENSION) 20 MG TABLET: 30 days supply | Qty: 180 | Fill #6 | Status: AC

## 2018-12-27 ENCOUNTER — Ambulatory Visit: Admit: 2018-12-27 | Discharge: 2018-12-28 | Payer: MEDICARE

## 2018-12-27 LAB — CREATININE, WHOLE BLOOD
CREATININE, WHOLE BLOOD: 2.2 mg/dL — ABNORMAL HIGH (ref 0.8–1.4)
Lab: 2.2 — ABNORMAL HIGH

## 2018-12-27 NOTE — Unmapped (Signed)
Called wife to apologize for mix up for MRI appointment. Informed wife I spoke with person In charge of scheduling MRI with pacemaker([facility] at [phone] )  on Tuesday to make sure it was moved to 10/22 as we had discussed on 10/1 and was it will be taken care of. Apologized to wife for having to rearrange her schedule in short notice, wife said that is fine and she was able to ask her sister to drive her to radiation therapy. Requested patient to call me or nurse coordinator if future.

## 2018-12-27 NOTE — Unmapped (Signed)
Error

## 2018-12-27 NOTE — Unmapped (Signed)
MRI with Cardiac Implanted Electronic Device  Programming before and after MRI     TESTING:       Sensing (mV):      RV PRE: 7.3  POST: 8.1    Capture Threshold (V@_ms ):    RV PRE: 1.00V@0 .40 ms   POST: 0.50V2 0.40 ms            Impedance (between 200-1500ohms):     RV PRE:  470 POST: 470                Programming Changes During MRI    Pacemaker Dependent:  NO  CIED programmed to OVO with MRI SureScan programmed ON  (If programming VOO/DOO and there is an underlying rhythm, the pacing rate will be programmed faster than the underlying rate to avoid competitive pacing)  PACEMAKER-IF NON-CONDITIONAL-BATTERY TEST DEACTIVATED IF APPLICABLE  ICD-[employer] may still tone when exposed to the magnet even though ICD is deactivated    SETTINGS RESTORED TO PREVIOUS POST SCAN    Follow-up advised in 3-6 months after MRI unless earlier follow-up (within a week) is indicated for the following:  Any capture threshold increase >1.0 V, sensing drop >50%, pacing impedance change >50 ohms, or shock impedance change >5 ohms

## 2018-12-31 ENCOUNTER — Ambulatory Visit
Admit: 2018-12-31 | Discharge: 2019-01-01 | Payer: MEDICARE | Attending: Student in an Organized Health Care Education/Training Program | Primary: Student in an Organized Health Care Education/Training Program

## 2018-12-31 ENCOUNTER — Ambulatory Visit
Admit: 2018-12-31 | Discharge: 2019-01-21 | Payer: MEDICARE | Attending: Speech-Language Pathologist | Primary: Speech-Language Pathologist

## 2018-12-31 NOTE — Unmapped (Signed)
Otolaryngology     Reason for visit:  Follow up     History of Present Illness:     Recall, initial presentation on 01/30/17 [name] is a 68 y.o. 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 He spent 3 months in the hospital following his transplant he had respiratory compromise and ultimately had tracheostomy tube for much of that stay. He was decannulated in September before discharge home. 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  Patient reports he lost his voice following liver transplant episode 3 months ago. He states that since surgery his voice is breathy and weak. He cannot control his pitch. His voice symptoms do not fluctuate throughout the day and he has not noticed anything that makes it better or worse. He does have a cough throughout the day. He also endorses globus sensation. He denies dysphagia. He is maintained on an oral diet but has not had his G-tube removed yet due to surgeon preference. He has never had voice issues before and has never seen an ENT doctor for any related issues. He does endorse consistent shortness of breath with exertion and notes that this is likely secondary to his recovery process from his liver transplant. She does take Prilosec for his GERD. Thinks that this is stable. No coughing or choking while eating.    He was noted to have a right vocal fold paralysis with significant breathy voice and glottal insufficiency.   For multilpe reasons intervention was delayed.   He has had quite a complicated course.     He did have anemia and hemothorax.     He underwent in office injection cymetra on 05/31/17 0.36ml right fold. Since then, he has had multiple set backs in his GI care. He had a fall and possible liver abscess. He underwent intubation and ERCP in April. He has been admitted to the hospital twice.     08/08/17: He returns today in follow-up.  He reports no significant improvement in the voice following injection.  He continues to be weak and breathy.  He is also had increasing coughing on foods and liquids.  No pneumonia.  He has had significant illnesses since his injection however as stated above.    He was taken back to [facility] on 09/13/17 for in office [facility] injection TTH cymetra 0.75ml.     10/03/17:   He returns after injection. The voice improved but by only about 50%. The voice has started to decline. He also complains of coughing while eating. This has not changed with injection augmentation. He does state he is louder and easier, but still weak from voice standpoint.    10.21.19:   Follows up for reassessment. Per wife, voice has remained stable since last visit. Improved compared to prior to injection but still with breathiness and vocal fatigue. He was unable to complete voice therpay. He had Hip fracture surgery recently and was intubated for this. Wife states still with some coughing after meals. No regurgitation after meals. Patient states imprpoved.     03/13/18: He comes in today for preop for voice surgery however he states his swallowing has become more of an issue.  He continues to get choked on solid foods and has to regurgitate to swallow things down.  He feels like this  is getting worse and is currently a larger issue than the voice.     04/04/18: OR for endoscopic co2 laser zenker diverticulotomy and botox CP     04/17/18: he comes in today for postop. His postop swallow study showed no extravasation and was dishcarged home on pod 1 stable. He denies fevers, chest pain, tachycardia. He continues to have a cough. 09/24/18: he comes in today for follow up. Regarding his voice, he is still quite frustrated. It is weak and he has vocal fatigue. He is interested in permanent vocal rehab. Recall he is now off coumadin.     With regards to swallow, he is s/p endo co2 laser zenker diverticulotomy on 04/04/18. His wife still complains that he coughs when he eats. He also has dry cough when not eating. He is able to eat all consistencies of food. He had MBS on 05/15/18 that showed minimal residue in minimal residual zenker. He did have some backflow of solids and mild pharyngeal dysphagia.    11/05/18: he returns in fu. No improvement in cough with SLN injection. Still with breathy dysphonia. Here for preop for thyroplasty. No new issues. No URI, no chest pain.     11/07/2018: OR for medialization thyroplasty with silastic implant on right    11/13/2018:  He has been doing well since surgery.  Improved voice but still with some breathy dysphonia. No dysphagia. No swelling or erythema at site.     12/31/18: the voice was doing well. He feels he must have strained it. He still has breathy dysphonia. It comes and goes. Sometimes it is louder and easier. He has not had voice therapy yet.       Past Medical History:  Past Medical History:   Diagnosis Date   ??? Atrial fibrillation (CMS-HCC)    ??? Basal cell carcinoma    ??? Cancer (CMS-HCC)    ??? Cirrhosis (CMS-HCC)    ??? Depression    ??? Diabetes (CMS-HCC)    ??? Headache    ??? Hepatitis C 07/17/2012   ??? History of transfusion    ??? Liver disease    ??? Low back pain 07/17/2012   ??? Varices, esophageal (CMS-HCC)        Past Surgical History:  Past Surgical History:   Procedure Laterality Date   ??? ANKLE SURGERY     ??? BACK SURGERY     ??? BACK SURGERY     ??? CARDIAC SURGERY      pacemaker   ??? CHG US GUIDE, TISSUE ABLATION N/A 01/22/2016    Procedure: ULTRASOUND GUIDANCE FOR, AND MONITORING OF, PARENCHYMAL TISSUE ABLATION;  Surgeon: Particia Nearing, MD;  Location: MAIN OR [facility];  Service: Transplant ??? IR EMBOLIZATION ORGAN ISCHEMIA, TUMORS, INFAR  06/16/2016    IR EMBOLIZATION ORGAN ISCHEMIA, TUMORS, INFAR 06/16/2016 Ammie Dalton, MD IMG VIR [facility]   ??? PR COLON CA SCRN NOT HI RSK IND N/A 02/27/2015    Procedure: COLOREC CNCR SCR;COLNSCPY NO;  Surgeon: Vonda Antigua, MD;  Location: GI PROCEDURES MEMORIAL [facility];  Service: Gastroenterology   ??? PR ENDOSCOPIC ULTRASOUND EXAM N/A 02/27/2015    Procedure: UGI ENDO; W/ENDO ULTRASOUND EXAM INCLUDES ESOPHAGUS, STOMACH, &/OR DUODENUM/JEJUNUM;  Surgeon: Vonda Antigua, MD;  Location: GI PROCEDURES MEMORIAL [facility];  Service: Gastroenterology   ??? PR ERCP BALLOON DILATE BILIARY/PANC DUCT/AMPULLA EA N/A 02/10/2017    Procedure: ERCP;WITH TRANS-ENDOSCOPIC BALLOON DILATION OF BILIARY/PANCREATIC DUCT(S) OR OF AMPULLA, INCLUDING SPHINCTERECTOMY, WHEN PERFOREMD,EACH DUCT (16109);  Surgeon: Cheyenne Adas  Josetta Huddle, MD;  Location: GI PROCEDURES MEMORIAL [facility];  Service: Gastroenterology   ??? PR ERCP REMOVE FOREIGN BODY/STENT BILIARY/PANC DUCT N/A 02/10/2017    Procedure: ENDOSCOPIC RETROGRADE CHOLANGIOPANCREATOGRAPHY (ERCP); W/ REMOVAL OF FOREIGN BODY/STENT FROM BILIARY/PANCREATIC DUCT(S);  Surgeon: Mayford Knife, MD;  Location: GI PROCEDURES MEMORIAL [facility];  Service: Gastroenterology   ??? PR ERCP REMOVE FOREIGN BODY/STENT BILIARY/PANC DUCT N/A 06/29/2017    Procedure: ENDOSCOPIC RETROGRADE CHOLANGIOPANCREATOGRAPHY (ERCP); W/ REMOVAL OF FOREIGN BODY/STENT FROM BILIARY/PANCREATIC DUCT(S);  Surgeon: Vonda Antigua, MD;  Location: GI PROCEDURES [facility]OR[facility];  Service: Gastroenterology   ??? PR ERCP STENT PLACEMENT BILIARY/PANCREATIC DUCT N/A 11/29/2016    Procedure: ENDOSCOPIC RETROGRADE CHOLANGIOPANCREATOGRAPHY (ERCP); WITH PLACEMENT OF ENDOSCOPIC STENT INTO BILIARY OR PANCREATIC DUCT;  Surgeon: Chriss Driver, MD;  Location: GI PROCEDURES MEMORIAL [facility];  Service: Gastroenterology   ??? PR ERCP STENT PLACEMENT BILIARY/PANCREATIC DUCT N/A 04/26/2017 Procedure: ENDOSCOPIC RETROGRADE CHOLANGIOPANCREATOGRAPHY (ERCP); WITH PLACEMENT OF ENDOSCOPIC STENT INTO BILIARY OR PANCREATIC DUCT;  Surgeon: Mayford Knife, MD;  Location: GI PROCEDURES MEMOR[facility];  Service: Gastroenterology   ??? PR ERCP,W/REMOVAL STONE,BIL/PANCR DUCTS N/A 11/29/2016    Procedure: ERCP; W/ENDOSCOPIC RETROGRADE REMOVAL OF CALCULUS/CALCULI FROM BILIARY &/OR PANCREATIC DUCTS;  Surgeon: Chriss Driver, MD;  Location: GI PROCEDURES MEMORIAL [facility];  Service: Gastroenterology   ??? PR ERCP,W/REMOVAL STONE,BIL/PANCR DUCTS N/A 02/10/2017    Procedure: ERCP; W/ENDOSCOPIC RETROGRADE REMOVAL OF CALCULUS/CALCULI FROM BILIARY &/OR PANCREATIC DUCTS;  Surgeon: Mayford Knife, MD;  Location: GI PROCEDURES MEMORIAL [facility];  Service: Gastroenterology   ??? PR ERCP,W/REMOVAL STONE,BIL/PANCR DUCTS N/A 04/26/2017    Procedure: ERCP; W/ENDOSCOPIC RETROGRADE REMOVAL OF CALCULUS/CALCULI FROM BILIARY &/OR PANCREATIC DUCTS;  Surgeon: Mayford Knife, MD;  Location: GI PROCEDURES MEMOR[facility];  Service: Gastroenterology   ??? PR ERCP,W/REMOVAL STONE,BIL/PANCR DUCTS N/A 06/29/2017    Procedure: ERCP; W/ENDOSCOPIC RETROGRADE REMOVAL OF CALCULUS/CALCULI FROM BILIARY &/OR PANCREATIC DUCTS;  Surgeon: Vonda Antigua, MD;  Location: GI PROCEDURES MEMORIAL [facility];  Service: Gastroenterology   ??? PR ESOPHAGOSCP RIG TRANSORAL HYPOPHARYNX CRV ESOPH N/A 04/04/2018    Procedure: ESOPHAGOSCOPY, RIGID, TRANSORAL WITH DIVERTICULECTOMY OF HYPOPHARYNX OR CERVICAL ESOPHAGUS, WITH CRICOPHARYNGEAL MYOTOMY, INCLUDES USE OF TELESCOPE OR OPERATING MICROSCOPE AND REPAIR, WHEN PERFORMED;  Surgeon: Vista Deck, MD;  Location: MAIN OR [facility];  Service: ENT   ??? PR INSER HEART TEMP PACER ONE CHMBR N/A 10/02/2016    Procedure: Tempoarary Pacemaker Insertion;  Surgeon: Meredith Leeds, MD;  Location: [facility];  Service: Cardiology   ??? PR LAP,DIAGNOSTIC ABDOMEN N/A 01/22/2016 Procedure: Laparoscopy, Abdomen, Peritoneum, & Omentum, Diagnostic, W/Wo Collection Specimen(S) By Brushing Or Washing;  Surgeon: Particia Nearing, MD;  Location: MAIN OR [facility];  Service: Transplant   ??? PR LARYNGOPLASTY MEDIALIZATION UNLIATERAL N/A 11/07/2018    Procedure: R31 LARYNGOPLASTY, MEDIALIZATION, UNILATERAL;  Surgeon: Vista Deck, MD;  Location: MAIN OR [facility];  Service: ENT   ??? PR PLACE PERCUT GASTROSTOMY TUBE N/A 11/17/2016    Procedure: UGI ENDO; W/DIRECTED PLCMT PERQ GASTROSTOMY TUBE;  Surgeon: Cletis Athens, MD;  Location: GI PROCEDURES MEMORIAL [facility];  Service: Gastroenterology   ??? PR TRACHEOSTOMY, PLANNED N/A 09/29/2016    Procedure: TRACHEOSTOMY PLANNED (SEPART PROC);  Surgeon: Katherina Mires, MD;  Location: MAIN OR [facility];  Service: Trauma   ??? PR TRANSCATH INSERT OR REPLACE LEADLESS PM VENTR N/A 10/11/2016    Procedure: Pacemaker Implant/Replace Leadless;  Surgeon: Meredith Leeds, MD;  Location: [facility];  Service: Cardiology   ??? PR TRANSPLANT LIVER,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  Procedure: Liver Allotransplantation; Orthotopic, Partial Or Whole, From Cadaver Or Living Donor, Any Age;  Surgeon: Doyce Loose, MD;  Location: MAIN OR [facility];  Service: Transplant   ??? PR TRANSPLANT,PREP DONOR LIVER, WHOLE N/A 09/15/2016    Procedure: Rogelia Boga Std Prep Cad Donor Whole Liver Gft Prior Tnsplnt,Inc Chole,Diss/Rem Surr Tissu Wo Triseg/Lobe Splt;  Surgeon: Doyce Loose, MD;  Location: MAIN OR [facility];  Service: Transplant   ??? PR UPPER GI ENDOSCOPY,BIOPSY N/A 07/17/2012    Procedure: UGI ENDOSCOPY; WITH BIOPSY, SINGLE OR MULTIPLE;  Surgeon: Alba Destine, MD;  Location: GI PROCEDURES [facility];  Service: Gastroenterology   ??? PR UPPER GI ENDOSCOPY,DIAGNOSIS N/A 02/04/2014 Procedure: UGI ENDO, INCLUDE ESOPHAGUS, STOMACH, & DUODENUM &/OR JEJUNUM; DX W/WO COLLECTION SPECIMN, BY BRUSH OR WASH;  Surgeon: Wilburt Finlay, MD;  Location: GI PROCEDURES [facility];  Service: Gastroenterology   ??? PR UPPER GI ENDOSCOPY,LIGAT VARIX N/A 11/05/2013    Procedure: UGI ENDO; W/BAND LIG ESOPH &/OR GASTRIC VARICES;  Surgeon: Wilburt Finlay, MD;  Location: GI PROCEDURES MEMORIAL [facility];  Service: Gastroenterology       Medications:    Current Outpatient Medications:   ???  alendronate (FOSAMAX) 70 MG tablet, Take 1 tablet (70 mg total) by mouth every seven (7) days., Disp: 4 tablet, Rfl: 11  ???  bisacodyl (DULCOLAX, BISACODYL,) 5 mg EC tablet, Take 5 mg by mouth daily as needed. , Disp: , Rfl:   ???  blood sugar diagnostic Strp, Use three (3) times a day before meals., Disp: 100 each, Rfl: 7  ???  blood-glucose meter (GLUCOSE MONITORING KIT) kit, Use as instructed, Disp: 1 each, Rfl: 0  ???  calcium carbonate (OS-CAL) 500 mg calcium (1,250 mg) chewable tablet, Chew 2 tablets Two (2) times a day. , Disp: , Rfl:   ???  cycloSPORINE modified (NEORAL) 100 MG capsule, Take 2 capsules (200 mg) by mouth in the morning and 1 capsule in the evening with three 25mg  capsules (For a total daily dose of 200mg  each morning and 175mg  every evening), Disp: 90 each, Rfl: 11  ???  cycloSPORINE modified (NEORAL) 25 MG capsule, Take 3 capsules (75mg ) by mouth every evening with a 100mg  capsule every evening (For a total dose of 175mg  every evening), Disp: 90 each, Rfl: 11  ???  glimepiride (AMARYL) 4 MG tablet, Take 4 mg by mouth., Disp: , Rfl:   ???  insulin glargine (BASAGLAR KWIKPEN U-100 INSULIN) 100 unit/mL (3 mL) injection pen, Inject 0.12 mL (12 Units total) under the skin nightly., Disp: 15 mL, Rfl: 12  ???  insulin lispro (HUMALOG KWIKPEN INSULIN) 100 unit/mL injection pen, Inject 4 Units under the skin Three (3) times a day before meals., Disp: 15 mL, Rfl: 12 ???  magnesium oxide-Mg AA chelate (MAGNESIUM, AMINO ACID CHELATE,) 133 mg Tab, Take 2 tablets by mouth Two (2) times a day., Disp: 100 tablet, Rfl: PRN  ???  melatonin 3 mg Tab, Take 1 tablet (3 mg total) by mouth nightly., Disp: 30 tablet, Rfl: 0  ???  mirtazapine (REMERON) 15 MG tablet, TAKE 1 TABLET BY MOUTH NIGHTLY, Disp: 90 tablet, Rfl: 3  ???  mycophenolate (CELLCEPT) 250 mg capsule, Take 2 capsules (500 mg total) by mouth two (2) times a day., Disp: 120 capsule, Rfl: 7  ???  pen needle, diabetic 32 gauge x 5/32 (4 mm) Ndle, USE WITH INSULIN PEN 4 TIMES DAILY AS DIRECTED, Disp: 100 each, Rfl: 1  ???  predniSONE (DELTASONE) 5 MG tablet,  Take 4 tablets (20 mg) by mouth for one day, then take 3 tablets (15 mg) the next day, then take 2 tablets (10 mg) the next day, then take 1 tablet (5mg ) then stop, Disp: 10 tablet, Rfl: 0  ???  pregabalin (LYRICA) 75 MG capsule, Take 1 capsule (75 mg total) by mouth 2 (two) times daily (Patient taking differently: Take 75 mg by mouth daily as needed. ), Disp: 60 capsule, Rfl: 6  ???  sildenafiL, pulm.hypertension, (REVATIO) 20 mg tablet, TAKE 2 TABLETS (40 MG TOTAL) BY G-TUBE THREE TIMES DAILY, Disp: 180 tablet, Rfl: 6  ???  sodium bicarbonate 650 mg tablet, Take 1 tablet (650 mg total) by mouth two (2) times a day., Disp: 100 tablet, Rfl: 6     Allergies:  No Known Allergies    Family History:  Family History   Problem Relation Age of Onset   ??? Hypertension Mother    ??? Cancer Mother    ??? Heart disease Mother    ??? Heart disease Father    ??? Cirrhosis Neg Hx    ??? Liver cancer Neg Hx    ??? Anemia Neg Hx    ??? Diabetes Neg Hx    ??? Kidney disease Neg Hx    ??? Obesity Neg Hx    ??? Thyroid disease Neg Hx    ??? Osteoporosis Neg Hx    ??? Coronary artery disease Neg Hx    ??? Anesthesia problems Neg Hx    ??? Basal cell carcinoma Neg Hx    ??? Squamous cell carcinoma Neg Hx      Social History:  Social History     Tobacco Use   ??? Smoking status: Never Smoker   ??? Smokeless tobacco: Never Used ??? Tobacco comment: Smoked in high school for about 5 years.    Substance Use Topics   ??? Alcohol use: No   ??? Drug use: No       Review of Systems:  Denies chest pain, sob, fevers, n/v, abd pain      Physical Exam:   Constitutional:  Vitals reviewed in chart, patient has normal appearance. Well nourished, well-developed, no acute distress  Voice:  Hoarse, breathy voice, improved with resonant voice  Respiration:  Breathing comfortably, no stridor.  CV: No clubbing/cyanosis/edema in hands.   Eyes:  extraocular motion intact, sclera normal.   Neuro:  Alert and oriented times 3, Cranial nerves 2-12 intact and symmetric bilaterally. Except X.  Head and Face:  Skin with no masses or lesions, sinuses nontender to palpation, facial nerve fully intact.   Salivary Glands:  Parotid and submandibular glands normal bilaterally.   Ears:  Normal external ears  Nose:  External nose midline, anterior rhinoscopy is normal with limited visualization just to the anterior interior turbinate.   Oral Cavity/Oropharynx/Lips:  Normal mucous membranes, normal floor of mouth/tongue/oropharynx, no masses or lesions are noted.  Good dentition  Pharyngeal Walls:  No masses noted.  Neck/Lymph:  No lymphadenopathy, no thyroid masses. Previous tracheostoma scar well healed. Incision well healed.   Larynx: Mirror evaluation insufficient to visualize secondary to prominent gag    Procedure Note    Endoscopy Type:  Flexible Fiberoptic Videostroboscopy    Indications/TimeOut:  To better evaluate the patient???s symptoms, fiberoptic videostroboscopy is indicated.   A time out identifying the patient, the procedure, the location of the procedure and any concerns was performed prior to beginning the procedure.    Procedure Details: The patient was placed in the  sitting position.  After topical anesthesia and decongestion with oxymetazoline and 1% lidocaine, the flexible laryngoscope was passed.  The microphone was used to trigger the xenon stroboscopic light source.    -  Nasal cavity  - There was no pus or polyps noted in the nasal cavity. Septum intact  -  Nasopharynx - there was no evidence of mass or lesion.  -  Hypopharynx - There were no lesions in the pyriformis, epiglottis, or base of tongue.  -  Larynx -  there was mild interarytenoid edema, no erythema.  -  Vocal Folds -     Mobility:right vocal fold hypomobility    Amplitude: symmetric   Mucosal Wave: Mucosal wave intact bilaterally phase shift   Closure: Complete   Lesions/Other findings: appropriate medialization noted on right.     Condition:  Stable.  Patient tolerated procedure well.    Complications:  None    Voice- Related Quality of Life (VR-QOL) Measure:  ?? I have trouble speaking loudly or being heard in noisy situations: 4  ?? I run out of air and need to take frequent breaths when talking: 4  ?? I sometimes do not know what will come out when I begin speaking: 4  ?? I am sometimes anxious or frustrated because of my voice: 5  ?? I sometimes get depressed because of my voice: 4  ?? I have trouble using the telephone because of my voice: 4  ?? I have trouble doing my job or practicing my profession because of my voice: 4  ?? I avoid going out socially because of my voice: 5  ?? I have to repeat myself to be understood: 4  ?? I have become less outgoing because of my voice: 4  VRQOL Raw Score: 42  Calculated Score: 20  Glottal Function Index:  ?? Speaking took extra effort: 4  ?? Throat discomfort or pain after using your voice: 3  ?? Vocal fatigue (voice weakened as you talked): 3  ?? Voice cracks or sounds different: 4  Glottal Function Index Total: 14      Assessment: [name] is a 68 y.o. 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  He  has a past medical history of Atrial fibrillation (CMS-HCC), Basal cell carcinoma, Cancer (CMS-HCC), Cirrhosis (CMS-HCC), Depression, Diabetes (CMS-HCC), Headache, Hepatitis C (07/17/2012), History of transfusion, Liver disease, Low back pain (07/17/2012), and Varices, esophageal (CMS-HCC).    Findings:   dypsphonia  right vocal fold immobility   Glottic insufficiency   --all temporally related to liver transplant transplant. (prolonged intubation, trach possible etiologies)   05/31/17 Status post in office injection augmentation with Cymetra in the [facility]   Significantly complicated history surrounding injection including hemothorax and anemia prior to injection followed by readmission x2 for fall rib pain and intubation for ERCP.  S/p [facility] on 09/13/17 for in office [facility] injection TTH cymetra 0.82ml.   Small Zenker diverticulum, mild pharyngeal dysphagia  S/p endoscopic co2 laser zenker diverticulotomy and botox injection CP 04/04/18   S/p medialization thyroplasty with silastic on right 11/07/18    Plan:  Doing well. Still with significant functional dysphonia. Excellent medializtion with good closure and mucosal wave.    Speech therapy which is necessary and standard of care for voice improvement.   Fu 3months.

## 2018-12-31 NOTE — Unmapped (Signed)
[facility] SPEECH [address]  OUTPATIENT SPEECH PATHOLOGY  12/31/2018      Patient Name: Jeffrey Ward  Date of Birth:09/22/1950  Session Number: 1  Diagnosis:   Encounter Diagnosis   Name Primary?   ??? Dysphonia Yes        Date of Evaluation: 12/31/18  Date of Symptom Onset: 09/15/16  Referred by: Martina Sinner, MD   Reason for Referral: Evaluation Speech, Language, Voice, Cognition                     ASSESSMENT:  Pt present with moderate to severe dysphonia    Characterized by: breathiness, hoarseness, pharyngeal focus, strained voice, roughness, decreased loudness, weak voice  Therapy Techniques utilized during trial/diagnostic therapy:: Increased pitch, Decreased airflow, Increased hard onsets, Lip/tongue trills, Resonant voice therapy       Stimulability: Pt was somewhat stimulable     Treatment Recommendations: Initiate Treatment            PLAN:  SLP Follow-up / Frequency: 1x week for Planned Treatment Duration : 8 sessions    Planned Interventions: Resonant Voice Therapy, Vocal Function Exercises, Diaphragmatic breathing/respiratory retraining, Conversational Coaching       Prognosis:  Good    Negative Prognosis Rationale: Time post onset, Severity of deficits       Positive Prognosis Rationale: Previous tx with benefit, Motivation      Goals:  Patient and Family Goals: To have a louder., clearer voice     STG 1: Breathing: Pt will demonstrate abdominal breathing for support of phonation with 80% accuracy given minimal cues    Time Frame: 3  Duration: months    STG 2: Resonance: Pt will balance oral/nasal resonance to maximize vocal output with minimal effort/laryngeal hyperfunction with 80% accuracy given minimal cues                  STG 3: Strength/balance: Pt will strengthen and balance laryngeal musculature for support of connected speech with 80% accuracy given minimal cues STG 4: Inflection: Pt. 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 He has a past medical history of Atrial fibrillation (CMS-HCC), Basal cell carcinoma, Cancer (CMS-HCC), Cirrhosis (CMS-HCC), Depression, Diabetes (CMS-HCC), Headache, Hepatitis C (07/17/2012), History of transfusion, Liver disease, Low back pain (07/17/2012), and Varices, esophageal (CMS-HCC). He spent 3 months in the hospital following his liver transplant where he had respiratory compromise and consequent extended tracheostomy tube during his stay. He was decannulated in September prior to discharge. 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 Post-surgery, patient reported that he lost his voice and that it was breathy and weak. He could not control his pitch, and his voice symptoms did not fluctuate throughout the day, with no apparent ameliorating or exacerbating factors. He underwent in-office MPR injection TTH cymetra on right fold in 05/31/17 and experienced multiple set-backs in his GI care since then. He had a  fall and possible liver abscess undergoing intubation and ERCP in April 2019 with 2 hospital admissions. He had another cymetra injection on 09/13/17 and returned with reports that his voice improved only by about 50%. He stated the voice was louder and easier, but still weak. In 04/04/2018, patient presented to the OR for endoscopic CO2 laser zenker diverticulotomy and botox CP. 09/24/2018 ??? Jeffrey Ward is seen in consultation at the request of Martina Sinner, MD for pre-operative measures of the voice. He explains that he is not heard well by others and has a hard time talking on the phone. He also comments that his speech seems higher than normal. He experiences shortness of breath almost every time he talks, especially when speaking for a longer period of time. No pain reported. Patient has previously benefited from voice therapy.     Today, Jeffrey Ward returns 6 week s/p medialization thyroplasty with silastic implant on right on 11/07/2018. Pt states that his voice is still rough and weak and she has problem projecting.    Communication Preference: Verbal         Barriers to Learning: No Barriers                                                     Prior treatment for referral reason: Yes   Comments: 2 sessions - found to be beneficial              Pain?: No      Precautions: Falls         Prior Function: Independent                                   Lives With: Spouse    Past Medical History:   Diagnosis Date   ??? Atrial fibrillation (CMS-HCC)    ??? Basal cell carcinoma    ??? Cancer (CMS-HCC)    ??? Cirrhosis (CMS-HCC)    ??? Depression    ??? Diabetes (CMS-HCC)    ??? Headache    ??? Hepatitis C 07/17/2012   ??? History of transfusion    ??? Liver disease    ??? Low back pain 07/17/2012   ??? Varices, esophageal (CMS-HCC)       Family History   Problem Relation Age of Onset   ??? Hypertension Mother    ??? Cancer Mother    ??? Heart disease Mother    ??? Heart disease Father    ??? Cirrhosis Neg Hx    ??? Liver cancer Neg Hx    ??? Anemia Neg Hx    ??? Diabetes Neg Hx    ??? Kidney disease Neg Hx    ??? Obesity Neg Hx    ??? Thyroid disease Neg Hx    ??? Osteoporosis Neg Hx    ??? Coronary artery disease Neg Hx    ??? Anesthesia problems Neg Hx    ??? Basal cell carcinoma Neg Hx    ??? Squamous cell carcinoma Neg Hx      Past Surgical History:   Procedure Laterality Date   ??? ANKLE SURGERY     ??? BACK SURGERY     ??? BACK SURGERY     ??? CARDIAC SURGERY      pacemaker   ??? CHG  US GUIDE, TISSUE ABLATION N/A 01/22/2016    Procedure: ULTRASOUND GUIDANCE FOR, AND MONITORING OF, PARENCHYMAL TISSUE ABLATION;  Surgeon: Particia Nearing, MD;  Location: MAIN OR [facility];  Service: Transplant   ??? IR EMBOLIZATION ORGAN ISCHEMIA, TUMORS, INFAR  06/16/2016    IR EMBOLIZATION ORGAN ISCHEMIA, TUMORS, INFAR 06/16/2016 Ammie Dalton, MD IMG VIR [facility] ??? PR COLON CA SCRN NOT HI RSK IND N/A 02/27/2015    Procedure: COLOREC CNCR SCR;COLNSCPY NO;  Surgeon: Vonda Antigua, MD;  Location: GI PROCEDURES MEMORIAL [facility];  Service: Gastroenterology   ??? PR ENDOSCOPIC ULTRASOUND EXAM N/A 02/27/2015    Procedure: UGI ENDO; W/ENDO ULTRASOUND EXAM INCLUDES ESOPHAGUS, STOMACH, &/OR DUODENUM/JEJUNUM;  Surgeon: Vonda Antigua, MD;  Location: GI PROCEDURES MEMORIAL [facility];  Service: Gastroenterology   ??? PR ERCP BALLOON DILATE BILIARY/PANC DUCT/AMPULLA EA N/A 02/10/2017    Procedure: ERCP;WITH TRANS-ENDOSCOPIC BALLOON DILATION OF BILIARY/PANCREATIC DUCT(S) OR OF AMPULLA, INCLUDING SPHINCTERECTOMY, WHEN PERFOREMD,EACH DUCT (16109);  Surgeon: Mayford Knife, MD;  Location: GI PROCEDURES [facility];  Service: Gastroenterology   ??? PR ERCP REMOVE FOREIGN BODY/STENT BILIARY/PANC DUCT N/A 02/10/2017    Procedure: ENDOSCOPIC RETROGRADE CHOLANGIOPANCREATOGRAPHY (ERCP); W/ REMOVAL OF FOREIGN BODY/STENT FROM BILIARY/PANCREATIC DUCT(S);  Surgeon: Mayford Knife, MD;  Location: GI PROCEDURES [facility]OR[facility];  Service: Gastroenterology   ??? PR ERCP REMOVE FOREIGN BODY/STENT BILIARY/PANC DUCT N/A 06/29/2017    Procedure: ENDOSCOPIC RETROGRADE CHOLANGIOPANCREATOGRAPHY (ERCP); W/ REMOVAL OF FOREIGN BODY/STENT FROM BILIARY/PANCREATIC DUCT(S);  Surgeon: Vonda Antigua, MD;  Location: GI PROCEDURES MEMORIAL [facility];  Service: Gastroenterology   ??? PR ERCP STENT PLACEMENT BILIARY/PANCREATIC DUCT N/A 11/29/2016    Procedure: ENDOSCOPIC RETROGRADE CHOLANGIOPANCREATOGRAPHY (ERCP); WITH PLACEMENT OF ENDOSCOPIC STENT INTO BILIARY OR PANCREATIC DUCT;  Surgeon: Chriss Driver, MD;  Location: GI PROCEDURES MEMORIAL [facility];  Service: Gastroenterology   ??? PR ERCP STENT PLACEMENT BILIARY/PANCREATIC DUCT N/A 04/26/2017 Procedure: ENDOSCOPIC RETROGRADE CHOLANGIOPANCREATOGRAPHY (ERCP); WITH PLACEMENT OF ENDOSCOPIC STENT INTO BILIARY OR PANCREATIC DUCT;  Surgeon: Mayford Knife, MD;  Location: GI PROCEDURES MEMORIAL [facility];  Service: Gastroenterology   ??? PR ERCP,W/REMOVAL STONE,BIL/PANCR DUCTS N/A 11/29/2016    Procedure: ERCP; W/ENDOSCOPIC RETROGRADE REMOVAL OF CALCULUS/CALCULI FROM BILIARY &/OR PANCREATIC DUCTS;  Surgeon: Chriss Driver, MD;  Location: GI PROCEDURES MEMORIAL [facility];  Service: Gastroenterology   ??? PR ERCP,W/REMOVAL STONE,BIL/PANCR DUCTS N/A 02/10/2017    Procedure: ERCP; W/ENDOSCOPIC RETROGRADE REMOVAL OF CALCULUS/CALCULI FROM BILIARY &/OR PANCREATIC DUCTS;  Surgeon: Mayford Knife, MD;  Location: GI PROCEDURES MEMORIAL [facility];  Service: Gastroenterology   ??? PR ERCP,W/REMOVAL STONE,BIL/PANCR DUCTS N/A 04/26/2017    Procedure: ERCP; W/ENDOSCOPIC RETROGRADE REMOVAL OF CALCULUS/CALCULI FROM BILIARY &/OR PANCREATIC DUCTS;  Surgeon: Mayford Knife, MD;  Location: GI PROCEDURES MEMOR[facility];  Service: Gastroenterology   ??? PR ERCP,W/REMOVAL STONE,BIL/PANCR DUCTS N/A 06/29/2017    Procedure: ERCP; W/ENDOSCOPIC RETROGRADE REMOVAL OF CALCULUS/CALCULI FROM BILIARY &/OR PANCREATIC DUCTS;  Surgeon: Vonda Antigua, MD;  Location: GI PROCEDURES [facility]OR[facility];  Service: Gastroenterology   ??? PR ESOPHAGOSCP RIG TRANSORAL HYPOPHARYNX CRV ESOPH N/A 04/04/2018    Procedure: ESOPHAGOSCOPY, RIGID, TRANSORAL WITH DIVERTICULECTOMY OF HYPOPHARYNX OR CERVICAL ESOPHAGUS, WITH CRICOPHARYNGEAL MYOTOMY, INCLUDES USE OF TELESCOPE OR OPERATING MICROSCOPE AND REPAIR, WHEN PERFORMED;  Surgeon: Vista Deck, MD;  Location: MAIN OR [facility];  Service: ENT   ??? PR INSER HEART TEMP PACER ONE CHMBR N/A 10/02/2016    Procedure: Tempoarary Pacemaker Insertion;  Surgeon: Meredith Leeds, MD;  Location: [facility];  Service: Cardiology   ??? PR LAP,DIAGNOSTIC ABDOMEN N/A  01/22/2016 Procedure: Laparoscopy, Abdomen, Peritoneum, & Omentum, Diagnostic, W/Wo Collection Specimen(S) By Brushing Or Washing;  Surgeon: Particia Nearing, MD;  Location: MAIN OR [facility];  Service: Transplant   ??? PR LARYNGOPLASTY MEDIALIZATION UNLIATERAL N/A 11/07/2018    Procedure: R31 LARYNGOPLASTY, MEDIALIZATION, UNILATERAL;  Surgeon: Vista Deck, MD;  [facility];  Service: ENT   ??? PR PLACE PERCUT GASTROSTOMY TUBE N/A 11/17/2016    Procedure: UGI ENDO; W/DIRECTED PLCMT PERQ GASTROSTOMY TUBE;  Surgeon: Cletis Athens, MD;  Location: GI PROCEDURES [facility];  Service: Gastroenterology   ??? PR TRACHEOSTOMY, PLANNED N/A 09/29/2016    Procedure: TRACHEOSTOMY PLANNED (SEPART PROC);  Surgeon: Katherina Mires, MD;  Location: MAIN OR [facility];  Service: Trauma   ??? PR TRANSCATH INSERT OR REPLACE LEADLESS PM VENTR N/A 10/11/2016    Procedure: Pacemaker Implant/Replace Leadless;  Surgeon: Meredith Leeds, MD;  Location: [facility];  Service: Cardiology   ??? PR TRANSPLANT LIVER,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    Procedure: Liver Allotransplantation; Orthotopic, Partial Or Whole, From Cadaver Or Living Donor, Any Age;  Surgeon: Doyce Loose, MD;  Location: MAIN OR [facility];  Service: Transplant   ??? PR TRANSPLANT,PREP DONOR LIVER, WHOLE N/A 09/15/2016    Procedure: Rogelia Boga Std Prep Cad Donor Whole Liver Gft Prior Tnsplnt,Inc Chole,Diss/Rem Surr Tissu Wo Triseg/Lobe Splt;  Surgeon: Doyce Loose, MD;  Location: MAIN OR [facility];  Service: Transplant   ??? PR UPPER GI ENDOSCOPY,BIOPSY N/A 07/17/2012    Procedure: UGI ENDOSCOPY; WITH BIOPSY, SINGLE OR MULTIPLE;  Surgeon: Alba Destine, MD;  Location: GI [facility]URES [facility];  Service: Gastroenterology   ??? PR UPPER GI ENDOSCOPY,DIAGNOSIS N/A 02/04/2014 Procedure: UGI ENDO, INCLUDE ESOPHAGUS, STOMACH, & DUODENUM &/OR JEJUNUM; DX W/WO COLLECTION SPECIMN, BY BRUSH OR WASH;  Surgeon: Wilburt Finlay, MD;  Location: GI PROCEDURES MEMORIAL [facility];  Service: Gastroenterology   ??? PR UPPER GI ENDOSCOPY,LIGAT VARIX N/A 11/05/2013    Procedure: UGI ENDO; W/BAND LIG ESOPH &/OR GASTRIC VARICES;  Surgeon: Wilburt Finlay, MD;  Location: GI PROCEDURES [facility]ORIAL [facility];  Service: Gastroenterology    No Known Allergies  Social History     Tobacco Use   ??? Smoking status: Never Smoker   ??? Smokeless tobacco: Never Used   ??? Tobacco comment: Smoked in high school for about 5 years.    Substance Use Topics   ??? Alcohol use: No      Current Outpatient Medications   Medication Sig Dispense Refill   ??? alendronate (FOSAMAX) 70 MG tablet Take 1 tablet (70 mg total) by mouth every seven (7) days. 4 tablet 11   ??? bisacodyl (DULCOLAX, BISACODYL,) 5 mg EC tablet Take 5 mg by mouth daily as needed.      ??? blood sugar diagnostic Strp Use three (3) times a day before meals. 100 each 7   ??? blood-glucose meter (GLUCOSE MONITORING KIT) kit Use as instructed 1 each 0   ??? calcium carbonate (OS-CAL) 500 mg calcium (1,250 mg) chewable tablet Chew 2 tablets Two (2) times a day.      ??? cycloSPORINE modified (NEORAL) 100 MG capsule Take 2 capsules (200 mg) by mouth in the morning and 1 capsule in the evening with three 25mg  capsules (For a total daily dose of 200mg  each morning and 175mg  every evening) 90 each 11   ??? cycloSPORINE modified (NEORAL) 25 MG capsule Take 3 capsules (75mg ) by mouth every evening with a 100mg  capsule every evening (For a total dose of 175mg  every  evening) 90 each 11   ??? glimepiride (AMARYL) 4 MG tablet Take 4 mg by mouth.     ??? insulin glargine (BASAGLAR KWIKPEN U-100 INSULIN) 100 unit/mL (3 mL) injection pen Inject 0.12 mL (12 Units total) under the skin nightly. 15 mL 12 ??? insulin lispro (HUMALOG KWIKPEN INSULIN) 100 unit/mL injection pen Inject 4 Units under the skin Three (3) times a day before meals. 15 mL 12   ??? magnesium oxide-Mg AA chelate (MAGNESIUM, AMINO ACID CHELATE,) 133 mg Tab Take 2 tablets by mouth Two (2) times a day. 100 tablet PRN   ??? melatonin 3 mg Tab Take 1 tablet (3 mg total) by mouth nightly. 30 tablet 0   ??? mirtazapine (REMERON) 15 MG tablet TAKE 1 TABLET BY MOUTH NIGHTLY 90 tablet 3   ??? mycophenolate (CELLCEPT) 250 mg capsule Take 2 capsules (500 mg total) by mouth two (2) times a day. 120 capsule 7   ??? pen needle, diabetic 32 gauge x 5/32 (4 mm) Ndle USE WITH INSULIN PEN 4 TIMES DAILY AS DIRECTED 100 each 1   ??? predniSONE (DELTASONE) 5 MG tablet Take 4 tablets (20 mg) by mouth for one day, then take 3 tablets (15 mg) the next day, then take 2 tablets (10 mg) the next day, then take 1 tablet (5mg ) then stop 10 tablet 0   ??? pregabalin (LYRICA) 75 MG capsule Take 1 capsule (75 mg total) by mouth 2 (two) times daily (Patient taking differently: Take 75 mg by mouth daily as needed. ) 60 capsule 6   ??? sildenafiL, pulm.hypertension, (REVATIO) 20 mg tablet TAKE 2 TABLETS (40 MG TOTAL) BY G-TUBE THREE TIMES DAILY 180 tablet 6   ??? sodium bicarbonate 650 mg tablet Take 1 tablet (650 mg total) by mouth two (2) times a day. 100 tablet 6     No current facility-administered medications for this visit.          OBJECTIVE  Voice - General   Hydration: 5 big bottles of water  Caffeine Use: 1 hot tea in the morning  Onset: Sudden  Daily Voice Use: Minimal demand   Acoustic Measures  Connected Sample: CAPE-V Sentences  Connected Sample 1 (Hz): 153.34 Hz   Total Fundamental Frequency  Total Fundamental Frequency Range (Hz): 101 Hz  to (Hz): 326 Hz   Sustained Phonation  Mean Fund Freq (Hz): 182.38 Hz  Relative Average Perturbation (RAP) %: 4.62 %  Noise-to-Harmonic Ratio (NHR): 0.3  Degree of Subharmonics (DSH) %: 2.99 %  Degree of Voiceless (DUV) %: 7.13 % Soft Phonation Index (SPI) : 37.31  Pt has decrease in following acoustic measures:: Total Fo range, Decreased in upper range   Aerodynamic Measures  Maximum phonation time on /a/ in sec:: 4 sec  Mean Flow Rate (mL/sec): 550 mL/sec  at (Hz): 168.78 Hz  and (dB): 85.27 dB  Objective and/or perceptual measures suggest:: Weak voice, Hyperfunctional pattern  Pulmonary Breath Support: SOB  Primary Breath Support: Diaphragmatic, Thoracic  Speaks on functional residual capacity?: No       Perceptual Measures  Grade:: 3  Rough:: 2  Breathy:: 2  Asthenic:: 2  Strained:: 1  Total:: 10 /15   Tremor / Rate of Speech  Tremor:: Absent  Rate of Speech:: WFL   Subjective Measures  Glottal Function Index (GFI):: 14  Voice-related Quality of Life (V-RQOL):: 20    Session Duration : 30    Today's Charges (noted here with $$):     SLP Evaluations  $$  Voice Quality Evaluation [mins]: 15  $$ Laryngeal Function Study - Voice Ctr only [mins]: 15                I attest that I have reviewed the above information.  Signed: Genene Churn, SLP  12/31/2018 1:55 PM     Patient removed their mask during the session due to PAS testing for ~1 minute. SLP wore mask and eye protection throughout session.

## 2018-12-31 NOTE — Unmapped (Signed)
Per NP Martin-Velez since MRI showed that the liver abscesses are shrinking and his lfts are stable (as per the last labs there is nothing to do if his repeat CBC and CMP are fine.

## 2018-12-31 NOTE — Unmapped (Signed)
Flexible endoscope serial number [id number] used today by Martina Sinner, MD

## 2019-01-07 NOTE — Unmapped (Signed)
Called patient to prep clinic for annual appointment virtually scheduled with NP Martin-Velez tomorrow.  Wife stated patient is out of the house picking up her medication currently.  Wife is now getting radiation and chemo treatment 8 am to 3:30 pm on Wednesdays. Wife currently with one treatment left.  Patient now pretty independent with his self care since wife is sick.  He is now helping to take care of wife and has niece and sister-in-law to help with his wife medical care also.      Spoke with wife and reviewed all of patient current medication.  Asked if patient should be on Vitamin E since patient has it but not sure if he should take it.       Throat surgeries have occurred but voice has not improved but patient continues to practice is vocal exercises.      Reminded her to be ready fr call at 11 am tomorrow. Patient lab and transplant medications currently up to date on orders.

## 2019-01-08 ENCOUNTER — Institutional Professional Consult (permissible substitution): Admit: 2019-01-08 | Discharge: 2019-01-09 | Payer: MEDICARE

## 2019-01-08 NOTE — Unmapped (Signed)
FOLLOW UP ANNUAL LIVER CLINIC NOTE     Patient Name: Jeffrey Ward  Medical Record Number: [id number]  Date of Service: 01/08/2019    Referring Physician: [name]   Current complaint: Follow up Annual Liver    Assessment/Plan:     Delvecchio Madole is a 68 y.o. 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  Recent imaging stable with continued abscess improvement, no HCC. Patient has been taking 200mg /175mg  of cyclosporine and 500mg  BID of cellcept; immunosuppressive level goal range (100-125).       Discussed patient need for labs including AFP, would like to decrease rejection meds if possible.    HEALTH MAINTENANCE:   - Dermatology: This patient is at increased risk for the development of skin cancers due to immunosuppressant medications. We recommend yearly surveillance.  - Colonoscopy: Until age 13, we recommend routine surveillance.   - Dental: no issues  - Mental health: no overt issues  Immunization History   Administered Date(s) Administered   ??? INFLUENZA TIV (TRI) 45MO+ W/ PRESERV (IM) 01/07/2014   ??? Influenza Vaccine Quad (IIV4 PF) 48mo+ injectable 12/23/2015, 01/12/2017, 12/28/2017   ??? Influenza Virus Vaccine, unspecified formulation 01/07/2014   ??? PNEUMOCOCCAL POLYSACCHARIDE 23 01/20/2011, 03/01/2017   ??? TdaP 01/12/2009, 05/17/2012   ??? ZOSTAVAX - ZOSTER VACCINE, LIVE, SQ 05/17/2012   Will get influenza vaccine locally.     Reeducated patient on the importance of preventing illness, infection, and opportunistic cancers and the rationale behind vaccines and routine imaging, given his status as an immunocompromised person.     Return to clinic: 1 year  Labs: monthly    I spent 15 minutes on the phone with the patient. I spent an additional 10 minutes on pre- and post-visit activities. I was located offsite. The patient was physically located in [place] or a state in which I am permitted to provide care. The patient and/or parent/guardian understood that s/he may incur co-pays and cost sharing, and agreed to the telemedicine visit. The visit was reasonable and appropriate under the circumstances given the patient's presentation at the time.    The patient and/or parent/guardian has been advised of the potential risks and limitations of this mode of treatment (including, but not limited to, the absence of in-person examination) and has agreed to be treated using telemedicine. The patient's/patient's family's questions regarding telemedicine have been answered.     If the visit was completed in an ambulatory setting, the patient and/or parent/guardian has also been advised to contact their provider???s office for worsening conditions, and seek emergency medical treatment and/or call 911 if the patient deems either necessary.        Subjective:     Jeffrey Ward is a 68 y.o. male with history of HCV cirrhosis and HCC s/p OLT 09/15/2016 c/b multiple medical problems post-operatively, including hepatic lesions inevitably determined to be abscesses s/p antibiotic therapy and broken hip s/p surgical repair. He additionally, given his multiple intubations, presumed traumatic, he suffered a right vocal fold paralysis and underwent 03/2018 endoscopic CO2 laser and injection for breathy voice and glottal insufficiency with minimal improvement to symptoms. We additionally referred patient to nephrology for CKD III management. Denies fever, chills, weight loss/gain, and fatigue. Denies chest pain, SOB, N/V/D, constipation or abdominal pain. No acute complaint today but unfortunately now caregiving for his wife who was diagnosed with some sort of uterine/cervical cancer per his report. He is suffering from a big problem in back after he received  a steroid shot in back and it didn't do anything. Continues to take tylenol, encouraged him to follow up with ortho.       Past Medical History:   Diagnosis Date   ??? Atrial fibrillation (CMS-HCC)    ??? Basal cell carcinoma    ??? Cancer (CMS-HCC)    ??? Cirrhosis (CMS-HCC)    ??? Depression    ??? Diabetes (CMS-HCC)    ??? Headache    ??? Hepatitis C 07/17/2012   ??? History of transfusion    ??? Liver disease    ??? Low back pain 07/17/2012   ??? Varices, esophageal (CMS-HCC)        Past Surgical History:   Procedure Laterality Date   ??? ANKLE SURGERY     ??? BACK SURGERY     ??? BACK SURGERY     ??? CARDIAC SURGERY      pacemaker   ??? CHG US GUIDE, TISSUE ABLATION N/A 01/22/2016    Procedure: ULTRASOUND GUIDANCE FOR, AND MONITORING OF, PARENCHYMAL TISSUE ABLATION;  Surgeon: Particia Nearing, MD;  Location: MAIN OR [facility];  Service: Transplant   ??? IR EMBOLIZATION ORGAN ISCHEMIA, TUMORS, INFAR  06/16/2016    IR EMBOLIZATION ORGAN ISCHEMIA, TUMORS, INFAR 06/16/2016 Ammie Dalton, MD IMG VIR H[facility]   ??? PR COLON CA SCRN NOT HI RSK IND N/A 02/27/2015    Procedure: COLOREC CNCR SCR;COLNSCPY NO;  Surgeon: Vonda Antigua, MD;  Location: GI [facility]URES [facility];  Service: Gastroenterology   ??? PR ENDOSCOPIC ULTRASOUND EXAM N/A 02/27/2015    Procedure: UGI ENDO; W/ENDO ULTRASOUND EXAM INCLUDES ESOPHAGUS, STOMACH, &/OR DUODENUM/JEJUNUM;  Surgeon: Vonda Antigua, MD;  Location: GI PROCEDURES [facility];  Service: Gastroenterology   ??? PR ERCP BALLOON DILATE BILIARY/PANC DUCT/AMPULLA EA N/A 02/10/2017 Procedure: ERCP;WITH TRANS-ENDOSCOPIC BALLOON DILATION OF BILIARY/PANCREATIC DUCT(S) OR OF AMPULLA, INCLUDING SPHINCTERECTOMY, WHEN PERFOREMD,EACH DUCT ([address]);  Surgeon: Mayford Knife, MD;  Location: GI PROC[facility]URES [facility];  Service: Gastroenterology   ??? PR ERCP REMOVE FOREIGN BODY/STENT BILIARY/PANC DUCT N/A 02/10/2017    Procedure: ENDOSCOPIC RETROGRADE CHOLANGIOPANCREATOGRAPHY (ERCP); W/ REMOVAL OF FOREIGN BODY/STENT FROM BILIARY/PANCREATIC DUCT(S);  Surgeon: Mayford Knife, MD;  Location: GI PROCEDURES [facility];  Service: Gastroenterology   ??? PR ERCP REMOVE FOREIGN BODY/STENT BILIARY/PANC DUCT N/A 06/29/2017    Procedure: ENDOSCOPIC RETROGRADE CHOLANGIOPANCREATOGRAPHY (ERCP); W/ REMOVAL OF FOREIGN BODY/STENT FROM BILIARY/PANCREATIC DUCT(S);  Surgeon: Vonda Antigua, MD;  Location: GI PROCEDURES [facility];  Service: Gastroenterology   ??? PR ERCP STENT PLACEMENT BILIARY/PANCREATIC DUCT N/A 11/29/2016    Procedure: ENDOSCOPIC RETROGRADE CHOLANGIOPANCREATOGRAPHY (ERCP); WITH PLACEMENT OF ENDOSCOPIC STENT INTO BILIARY OR PANCREATIC DUCT;  Surgeon: Chriss Driver, MD;  Location: GI PROCEDURES [facility];  Service: Gastroenterology   ??? PR ERCP STENT PLACEMENT BILIARY/PANCREATIC DUCT N/A 04/26/2017    Procedure: ENDOSCOPIC RETROGRADE CHOLANGIOPANCREATOGRAPHY (ERCP); WITH PLACEMENT OF ENDOSCOPIC STENT INTO BILIARY OR PANCREATIC DUCT;  Surgeon: Mayford Knife, MD;  Location: GI PROCEDURES [facility];  Service: Gastroenterology   ??? PR ERCP,W/REMOVAL STONE,BIL/PANCR DUCTS N/A 11/29/2016    Procedure: ERCP; W/ENDOSCOPIC RETROGRADE REMOVAL OF CALCULUS/CALCULI FROM BILIARY &/OR PANCREATIC DUCTS;  Surgeon: Chriss Driver, MD;  Location: GI PROCEDURES [facility];  Service: Gastroenterology   ??? PR ERCP,W/REMOVAL STONE,BIL/PANCR DUCTS N/A 02/10/2017 Procedure: ERCP; W/ENDOSCOPIC RETROGRADE REMOVAL OF CALCULUS/CALCULI FROM BILIARY &/OR PANCREATIC DUCTS;  Surgeon: Mayford Knife, MD;  Location: GI PROCEDURES [facility];  Service: Gastroenterology   ??? PR ERCP,W/REMOVAL STONE,BIL/PANCR DUCTS N/A 04/26/2017    Procedure:  ERCP; W/ENDOSCOPIC RETROGRADE REMOVAL OF CALCULUS/CALCULI FROM BILIARY &/OR PANCREATIC DUCTS;  Surgeon: Mayford Knife, MD;  Location: GI PROCEDURES [facility];  Service: Gastroenterology   ??? PR ERCP,W/REMOVAL STONE,BIL/PANCR DUCTS N/A 06/29/2017    Procedure: ERCP; W/ENDOSCOPIC RETROGRADE REMOVAL OF CALCULUS/CALCULI FROM BILIARY &/OR PANCREATIC DUCTS;  Surgeon: Vonda Antigua, MD;  Location: GI PROCEDURES [facility];  Service: Gastroenterology   ??? PR ESOPHAGOSCP RIG TRANSORAL HYPOPHARYNX CRV ESOPH N/A 04/04/2018    Procedure: ESOPHAGOSCOPY, RIGID, TRANSORAL WITH DIVERTICULECTOMY OF HYPOPHARYNX OR CERVICAL ESOPHAGUS, WITH CRICOPHARYNGEAL MYOTOMY, INCLUDES USE OF TELESCOPE OR OPERATING MICROSCOPE AND REPAIR, WHEN PERFORMED;  Surgeon: Vista Deck, MD;  Location: MAIN OR [facility];  Service: ENT   ??? PR INSER HEART TEMP PACER ONE CHMBR N/A 10/02/2016    Procedure: Tempoarary Pacemaker Insertion;  Surgeon: Meredith Leeds, MD;  Location: [facility];  Service: Cardiology   ??? PR LAP,DIAGNOSTIC ABDOMEN N/A 01/22/2016    Procedure: Laparoscopy, Abdomen, Peritoneum, & Omentum, Diagnostic, W/Wo Collection Specimen(S) By Brushing Or Washing;  Surgeon: Particia Nearing, MD;  Location: MAIN OR [facility];  Service: Transplant   ??? PR LARYNGOPLASTY MEDIALIZATION UNLIATERAL N/A 11/07/2018    Procedure: R31 LARYNGOPLASTY, MEDIALIZATION, UNILATERAL;  Surgeon: Vista Deck, MD;  Location: MAIN OR [facility];  Service: ENT   ??? PR PLACE PERCUT GASTROSTOMY TUBE N/A 11/17/2016    Procedure: UGI ENDO; W/DIRECTED PLCMT PERQ GASTROSTOMY TUBE;  Surgeon: Cletis Athens, MD;  Location: GI PROCEDURES [facility];  Service: Gastroenterology ??? PR TRACHEOSTOMY, PLANNED N/A 09/29/2016    Procedure: TRACHEOSTOMY PLANNED (SEPART PROC);  Surgeon: Katherina Mires, MD;  Location: MAIN OR [facility];  Service: Trauma   ??? PR TRANSCATH INSERT OR REPLACE LEADLESS PM VENTR N/A 10/11/2016    Procedure: Pacemaker Implant/Replace Leadless;  Surgeon: Meredith Leeds, MD;  Location: [facility];  Service: Cardiology   ??? PR TRANSPLANT LIVER,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    Procedure: Liver Allotransplantation; Orthotopic, Partial Or Whole, From Cadaver Or Living Donor, Any Age;  Surgeon: Doyce Loose, MD;  Location: MAIN OR [facility];  Service: Transplant   ??? PR TRANSPLANT,PREP DONOR LIVER, WHOLE N/A 09/15/2016    Procedure: Rogelia Boga Std Prep Cad Donor Whole Liver Gft Prior Tnsplnt,Inc Chole,Diss/Rem Surr Tissu Wo Triseg/Lobe Splt;  Surgeon: Doyce Loose, MD;  Location: MAIN OR [facility];  Service: Transplant   ??? PR UPPER GI ENDOSCOPY,BIOPSY N/A 07/17/2012    Procedure: UGI ENDOSCOPY; WITH BIOPSY, SINGLE OR MULTIPLE;  Surgeon: Alba Destine, MD;  Location: GI PROCEDURES [facility];  Service: Gastroenterology   ??? PR UPPER GI ENDOSCOPY,DIAGNOSIS N/A 02/04/2014    Procedure: UGI ENDO, INCLUDE ESOPHAGUS, STOMACH, & DUODENUM &/OR JEJUNUM; DX W/WO COLLECTION SPECIMN, BY BRUSH OR WASH;  Surgeon: Wilburt Finlay, MD;  Location: GI PROCEDURES [facility];  Service: Gastroenterology   ??? PR UPPER GI ENDOSCOPY,LIGAT VARIX N/A 11/05/2013    Procedure: UGI ENDO; W/BAND LIG ESOPH &/OR GASTRIC VARICES;  Surgeon: Wilburt Finlay, MD;  Location: GI PROCEDURES [facility];  Service: Gastroenterology       Family History   Problem Relation Age of Onset   ??? Hypertension Mother    ??? Cancer Mother    ??? Heart disease Mother    ??? Heart disease Father    ??? Cirrhosis Neg Hx    ??? Liver cancer Neg Hx    ??? Anemia Neg Hx    ??? Diabetes Neg Hx    ??? Kidney disease Neg Hx    ??? Obesity  Neg Hx    ??? Thyroid disease Neg Hx    ??? Osteoporosis Neg Hx    ??? Coronary artery disease Neg Hx ??? Anesthesia problems Neg Hx    ??? Basal cell carcinoma Neg Hx    ??? Squamous cell carcinoma Neg Hx        Social History     Socioeconomic History   ??? Marital status: Married     Spouse name: Not on file   ??? Number of children: Not on file   ??? Years of education: Not on file   ??? Highest education level: Not on file   Occupational History   ??? Not on file   Social Needs   ??? Financial resource strain: Not on file   ??? Food insecurity     Worry: Not on file     Inability: Not on file   ??? Transportation needs     Medical: Not on file     Non-medical: Not on file   Tobacco Use   ??? Smoking status: Never Smoker   ??? Smokeless tobacco: Never Used   ??? Tobacco comment: Smoked in high school for about 5 years.    Substance and Sexual Activity   ??? Alcohol use: No   ??? Drug use: No   ??? Sexual activity: Yes     Partners: Female     Birth control/protection: None   Lifestyle   ??? Physical activity     Days per week: Not on file     Minutes per session: Not on file   ??? Stress: Not on file   Relationships   ??? Social Wellsite geologist on phone: Not on file     Gets together: Not on file     Attends religious service: Not on file     Active member of club or organization: Not on file     Attends meetings of clubs or organizations: Not on file     Relationship status: Not on file   Other Topics Concern   ??? Do you use sunscreen? Not Asked   ??? Tanning bed use? Not Asked   ??? Are you easily burned? Not Asked   ??? Excessive sun exposure? Not Asked   ??? Blistering sunburns? Not Asked   ??? Exercise No   ??? Living Situation No   Social History Narrative   ??? Not on file       REVIEW OF SYSTEMS:   The balance of 10/12 systems is negative with the exception of HPI.    Objective:     MEDICATIONS:  No Known Allergies    Current Outpatient Medications   Medication Sig Dispense Refill   ??? alendronate (FOSAMAX) 70 MG tablet Take 1 tablet (70 mg total) by mouth every seven (7) days. 4 tablet 11 ??? bisacodyl (DULCOLAX, BISACODYL,) 5 mg EC tablet Take 5 mg by mouth daily as needed.      ??? blood sugar diagnostic Strp Use three (3) times a day before meals. 100 each 7   ??? blood-glucose meter (GLUCOSE MONITORING KIT) kit Use as instructed 1 each 0   ??? calcium carbonate (OS-CAL) 500 mg calcium (1,250 mg) chewable tablet Chew 2 tablets Two (2) times a day.      ??? cholecalciferol, vitamin D3, 125 mcg (5,000 unit) tablet Take 125 mcg by mouth daily.     ??? cycloSPORINE modified (NEORAL) 100 MG capsule Take 2 capsules (200 mg) by mouth in the morning  and 1 capsule in the evening with three 25mg  capsules (For a total daily dose of 200mg  each morning and 175mg  every evening) 90 each 11   ??? cycloSPORINE modified (NEORAL) 25 MG capsule Take 3 capsules (75mg ) by mouth every evening with a 100mg  capsule every evening (For a total dose of 175mg  every evening) 90 each 11   ??? glimepiride (AMARYL) 4 MG tablet Take 4 mg by mouth daily.      ??? insulin glargine (BASAGLAR KWIKPEN U-100 INSULIN) 100 unit/mL (3 mL) injection pen Inject 0.12 mL (12 Units total) under the skin nightly. 15 mL 12   ??? insulin lispro (HUMALOG KWIKPEN INSULIN) 100 unit/mL injection pen Inject 4 Units under the skin Three (3) times a day before meals. 15 mL 12   ??? magnesium oxide-Mg AA chelate (MAGNESIUM, AMINO ACID CHELATE,) 133 mg Tab Take 2 tablets by mouth Two (2) times a day. 100 tablet PRN   ??? melatonin 3 mg Tab Take 1 tablet (3 mg total) by mouth nightly. 30 tablet 0   ??? mirtazapine (REMERON) 15 MG tablet TAKE 1 TABLET BY MOUTH NIGHTLY 90 tablet 3   ??? mycophenolate (CELLCEPT) 250 mg capsule Take 2 capsules (500 mg total) by mouth two (2) times a day. 120 capsule 7   ??? pen needle, diabetic 32 gauge x 5/32 (4 mm) Ndle USE WITH INSULIN PEN 4 TIMES DAILY AS DIRECTED 100 each 1 ??? predniSONE (DELTASONE) 5 MG tablet Take 4 tablets (20 mg) by mouth for one day, then take 3 tablets (15 mg) the next day, then take 2 tablets (10 mg) the next day, then take 1 tablet (5mg ) then stop 10 tablet 0   ??? pregabalin (LYRICA) 75 MG capsule Take 1 capsule (75 mg total) by mouth 2 (two) times daily (Patient taking differently: Take 75 mg by mouth daily as needed. ) 60 capsule 6   ??? sildenafiL, pulm.hypertension, (REVATIO) 20 mg tablet TAKE 2 TABLETS (40 MG TOTAL) BY G-TUBE THREE TIMES DAILY 180 tablet 6   ??? sodium bicarbonate 650 mg tablet Take 1 tablet (650 mg total) by mouth two (2) times a day. 100 tablet 6     No current facility-administered medications for this visit.          PHYSICAL EXAM:  There were no vitals taken for this visit.  No physical exam completed via virtual visit.    LAB RESULTS:  All lab results reviewed     IMAGING:  Mri Abdomen W Wo Contrast  Result Date: 12/27/2018  -Sequela of liver transplant. Decreased size of hepatic abscesses in segments 2 and 8 now measuring up to 1.6 cm and previously up to 2.1 cm. No new hepatic lesions identified. 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    _______________________________________________        Gertie Fey, DNP, APRN, FNP-C  [facility]  [address]  [place], [place]  [address]

## 2019-01-09 DIAGNOSIS — Z8505 Personal history of malignant neoplasm of liver: Principal | ICD-10-CM

## 2019-01-09 DIAGNOSIS — D899 Disorder involving the immune mechanism, unspecified: Principal | ICD-10-CM

## 2019-01-09 DIAGNOSIS — Z944 Liver transplant status: Principal | ICD-10-CM

## 2019-01-09 DIAGNOSIS — Z8619 Personal history of other infectious and parasitic diseases: Principal | ICD-10-CM

## 2019-01-11 ENCOUNTER — Ambulatory Visit: Admit: 2019-01-11 | Discharge: 2019-01-12 | Payer: MEDICARE

## 2019-01-11 LAB — COMPREHENSIVE METABOLIC PANEL
ALBUMIN: 4 g/dL (ref 3.5–5.0)
ALKALINE PHOSPHATASE: 78 U/L (ref 38–126)
ALT (SGPT): 8 U/L (ref <50)
ANION GAP: 7 mmol/L (ref 7–15)
AST (SGOT): 14 U/L — ABNORMAL LOW (ref 19–55)
BILIRUBIN TOTAL: 0.6 mg/dL (ref 0.0–1.2)
BLOOD UREA NITROGEN: 37 mg/dL — ABNORMAL HIGH (ref 7–21)
CALCIUM: 9.8 mg/dL (ref 8.5–10.2)
CHLORIDE: 110 mmol/L — ABNORMAL HIGH (ref 98–107)
CO2: 25 mmol/L (ref 22.0–30.0)
CREATININE: 1.95 mg/dL — ABNORMAL HIGH (ref 0.70–1.30)
EGFR CKD-EPI AA MALE: 39 mL/min/{1.73_m2} — ABNORMAL LOW (ref >=60)
EGFR CKD-EPI NON-AA MALE: 34 mL/min/{1.73_m2} — ABNORMAL LOW (ref >=60)
GLUCOSE RANDOM: 88 mg/dL (ref 70–179)
POTASSIUM: 5.1 mmol/L — ABNORMAL HIGH (ref 3.5–5.0)
PROTEIN TOTAL: 6.3 g/dL — ABNORMAL LOW (ref 6.5–8.3)
SODIUM: 142 mmol/L (ref 135–145)

## 2019-01-11 LAB — CBC W/ AUTO DIFF
BASOPHILS ABSOLUTE COUNT: 0 10*9/L (ref 0.0–0.1)
BASOPHILS RELATIVE PERCENT: 0.3 %
EOSINOPHILS ABSOLUTE COUNT: 0.1 10*9/L (ref 0.0–0.4)
HEMATOCRIT: 37.2 % — ABNORMAL LOW (ref 41.0–53.0)
HEMOGLOBIN: 12.6 g/dL — ABNORMAL LOW (ref 13.5–17.5)
LARGE UNSTAINED CELLS: 2 % (ref 0–4)
LYMPHOCYTES ABSOLUTE COUNT: 1.5 10*9/L (ref 1.5–5.0)
LYMPHOCYTES RELATIVE PERCENT: 29.7 %
MEAN CORPUSCULAR HEMOGLOBIN CONC: 33.9 g/dL (ref 31.0–37.0)
MEAN CORPUSCULAR HEMOGLOBIN: 31.3 pg (ref 26.0–34.0)
MEAN CORPUSCULAR VOLUME: 92.1 fL (ref 80.0–100.0)
MONOCYTES ABSOLUTE COUNT: 0.3 10*9/L (ref 0.2–0.8)
MONOCYTES RELATIVE PERCENT: 5.7 %
NEUTROPHILS RELATIVE PERCENT: 60.9 %
PLATELET COUNT: 90 10*9/L — ABNORMAL LOW (ref 150–440)
RED BLOOD CELL COUNT: 4.04 10*12/L — ABNORMAL LOW (ref 4.50–5.90)
RED CELL DISTRIBUTION WIDTH: 14.2 % (ref 12.0–15.0)
WBC ADJUSTED: 5.2 10*9/L (ref 4.5–11.0)

## 2019-01-11 LAB — GAMMA GT: GAMMA GLUTAMYL TRANSFERASE: 24 U/L (ref 12–109)

## 2019-01-11 LAB — BILIRUBIN DIRECT: Bilirubin.glucuronidated+Bilirubin.albumin bound:MCnc:Pt:Ser/Plas:Qn:: 0.1

## 2019-01-11 LAB — HEMOGLOBIN: Hemoglobin:MCnc:Pt:Bld:Qn:: 12.6 — ABNORMAL LOW

## 2019-01-11 LAB — PHOSPHORUS: Phosphate:MCnc:Pt:Ser/Plas:Qn:: 2.7 — ABNORMAL LOW

## 2019-01-11 LAB — ANION GAP: Anion gap 3:SCnc:Pt:Ser/Plas:Qn:: 7

## 2019-01-11 LAB — MAGNESIUM: Magnesium:MCnc:Pt:Ser/Plas:Qn:: 1.9

## 2019-01-11 LAB — GAMMA GLUTAMYL TRANSFERASE: Gamma glutamyl transferase:CCnc:Pt:Ser/Plas:Qn:: 24

## 2019-01-11 LAB — AFP-TUMOR MARKER: Alpha-1-Fetoprotein.tumor marker:MCnc:Pt:Ser/Plas:Qn:: 2

## 2019-01-12 LAB — CYCLOSPORINE, TROUGH: Lab: 61 — ABNORMAL LOW

## 2019-01-15 LAB — HEPATITIS C RNA, QUANTITATIVE, PCR

## 2019-01-15 LAB — HCV RNA LOG10: Lab: 0

## 2019-01-15 NOTE — Unmapped (Signed)
Patient cyclosporine level is low at 66 last level was 88.  Per PharmD Mincemoyer will leave alone since labs are stable. ??Also since despite a lot of education about correct timing of lab draw but patient still went at the wrong time. ??Wife is currently in chemo and radiation and it is too hard for patient and wife to coordinate doing this currently.

## 2019-01-17 DIAGNOSIS — M8580 Other specified disorders of bone density and structure, unspecified site: Principal | ICD-10-CM

## 2019-01-17 DIAGNOSIS — Z79899 Other long term (current) drug therapy: Principal | ICD-10-CM

## 2019-01-17 DIAGNOSIS — Z944 Liver transplant status: Principal | ICD-10-CM

## 2019-01-17 MED ORDER — SILDENAFIL (PULMONARY HYPERTENSION) 20 MG TABLET
ORAL_TABLET | 6 refills | 0 days | Status: CP
Start: 2019-01-17 — End: 2020-01-17
  Filled 2019-01-29: qty 180, 30d supply, fill #0

## 2019-01-17 NOTE — Unmapped (Signed)
Pt request for RX Refill

## 2019-01-17 NOTE — Unmapped (Signed)
[facility] Refill Coordination Note    Specialty Medication(s) to be Shipped:   Transplant: mycophenolate mofetil 250mg , cyclosporine 25mg  and cyclosporine 100mg     Other medication(s) to be shipped: pregabalin, mirtazapine,magnesium, sildenafil     Jeffrey Ward, DOB: 05/17/1950  Phone: [phone] (home) [phone] (work)      All above HIPAA information was verified with patient's family member.     Completed refill call assessment today to schedule patient's medication shipment from the [facility] [phone]).       Specialty medication(s) and dose(s) confirmed: Regimen is correct and unchanged.   Changes to medications: Lonza reports no changes at this time.  Changes to insurance: No  Questions for the pharmacist: No    Confirmed patient received Welcome Packet with first shipment. The patient will receive a drug information handout for each medication shipped and additional FDA Medication Guides as required.       DISEASE/MEDICATION-SPECIFIC INFORMATION        N/A    SPECIALTY MEDICATION ADHERENCE     Medication Adherence    Patient reported X missed doses in the last month: 0  Specialty Medication: Cyclosporine 100 mg  Patient is on additional specialty medications: Yes  Additional Specialty Medications: Cyclosporine 25 mg  Patient Reported Additional Medication X Missed Doses in the Last Month: 0  Patient is on more than two specialty medications: Yes  Specialty Medication: Mycophenolate 250 mg  Patient Reported Additional Medication X Missed Doses in the Last Month: 0  Any gaps in refill history greater than 2 weeks in the last 3 months: no  Demonstrates understanding of importance of adherence: yes  Informant: spouse  Reliability of informant: reliable  Support network for adherence: family member  Confirmed plan for next specialty medication refill: delivery by pharmacy  Refills needed for supportive medications: yes, ordered or provider notified Mycophenolate 250 mg. 14 days on hand  Cyclosporine 25 mg. 14 days on hand  Cyclosporine 100 mg. 14 days on hand      SHIPPING     Shipping address confirmed in Epic.     Delivery Scheduled: Yes, Expected medication delivery date: 111820.     Medication will be delivered via UPS to the prescription address in Epic WAM.    Jeffrey Ward   [facility] Specialty Technician

## 2019-01-23 MED ORDER — ALENDRONATE 70 MG TABLET
ORAL_TABLET | ORAL | 11 refills | 28.00000 days
Start: 2019-01-23 — End: 2020-01-23

## 2019-01-29 MED FILL — CYCLOSPORINE MODIFIED 100 MG CAPSULE: 30 days supply | Qty: 90 | Fill #7

## 2019-01-29 MED FILL — PREGABALIN 75 MG CAPSULE: 30 days supply | Qty: 60 | Fill #2 | Status: AC

## 2019-01-29 MED FILL — CYCLOSPORINE MODIFIED 100 MG CAPSULE: 30 days supply | Qty: 90 | Fill #7 | Status: AC

## 2019-01-29 MED FILL — MYCOPHENOLATE MOFETIL 250 MG CAPSULE: 30 days supply | Qty: 120 | Fill #1 | Status: AC

## 2019-01-29 MED FILL — SILDENAFIL (PULMONARY HYPERTENSION) 20 MG TABLET: 30 days supply | Qty: 180 | Fill #0 | Status: AC

## 2019-01-29 MED FILL — MG-PLUS-PROTEIN 133 MG TABLET: 25 days supply | Qty: 100 | Fill #10 | Status: AC

## 2019-01-29 MED FILL — MG-PLUS-PROTEIN 133 MG TABLET: ORAL | 25 days supply | Qty: 100 | Fill #10

## 2019-01-29 MED FILL — PREGABALIN 75 MG CAPSULE: 30 days supply | Qty: 60 | Fill #2

## 2019-01-29 MED FILL — CYCLOSPORINE MODIFIED 25 MG CAPSULE: 30 days supply | Qty: 90 | Fill #7

## 2019-01-29 MED FILL — CYCLOSPORINE MODIFIED 25 MG CAPSULE: 30 days supply | Qty: 90 | Fill #7 | Status: AC

## 2019-01-29 MED FILL — ALENDRONATE 70 MG TABLET: 28 days supply | Qty: 4 | Fill #0 | Status: AC

## 2019-01-29 MED FILL — MYCOPHENOLATE MOFETIL 250 MG CAPSULE: ORAL | 30 days supply | Qty: 120 | Fill #1

## 2019-01-29 MED FILL — ALENDRONATE 70 MG TABLET: ORAL | 28 days supply | Qty: 4 | Fill #0

## 2019-01-31 ENCOUNTER — Telehealth
Admit: 2019-01-31 | Discharge: 2019-02-20 | Payer: MEDICARE | Attending: Speech-Language Pathologist | Primary: Speech-Language Pathologist

## 2019-01-31 MED FILL — SODIUM BICARBONATE 650 MG TABLET: 50 days supply | Qty: 100 | Fill #1 | Status: AC

## 2019-01-31 MED FILL — SODIUM BICARBONATE 650 MG TABLET: ORAL | 50 days supply | Qty: 100 | Fill #1

## 2019-02-01 NOTE — Unmapped (Signed)
[facility] SPEECH [address]  OUTPATIENT SPEECH PATHOLOGY  01/31/2019      Patient Name: Jeffrey Ward  Date of Birth:[birth year]  Session Number: 2  Diagnosis: No diagnosis found.        Date of Evaluation: 12/31/18  Date of Symptom Onset: 09/15/16  Referred by: Martina Sinner, MD      Surgery date (if applicable): 09/15/16  Dates of Certification: expires 01/03/2018    Chief Complaint: Dysphonia    Note Type: Treatment Note    ASSESSMENT:     Next Visit Plan: abdominal support of phonation, resonant voice, balanced phonation, easy onset, negative practice, conversation training    OBJECTIVE:      Pt seen for initiation of voice therapy. Reviewed Pt's Dx, established Pt's and SLP LTG's and STG's for therapy.  Introduced relaxation regime, reviewed vocal hygiene, instructed abdominal breathing and support for phonation, and taught resonant voice therapy concepts.     Pt was 70% accurate for engaging lower abdominal muscles to support breath flow during phonation given max cues.     Pt was 50% accurate for /v,z,w, m/ words  given max cues to decrease strain    Pt was 20% accurate for balanced phonation in straw exercises and lip trills given max cues for airflow and resonance shape.      Pt participated well in therapy with high level of motivation apparent and good insight with examples of improved voice in therapy tasks targeting abdominal breathing and resonant voice. Based on performance today, Px for improvement in Tx is fair.        Stimulability: Pt was somewhat stimulable  Treatment Recommendations: Continue Treatment    PLAN:  SLP Follow-up / Frequency: 1x week for Planned Treatment Duration : 8 sessions      Planned Interventions: Resonant Voice Therapy, Vocal Function Exercises, Diaphragmatic breathing/respiratory retraining, Health visitor Voice, Semi-occluded Vocal Tract Exercises, Financial controller, Functional Phonation Tasks    Prognosis:  Good Negative Prognosis Rationale: Time post onset, Severity of deficits       Positive Prognosis Rationale: Previous tx with benefit, Motivation      Goals:        STG 1: Breathing: Pt will demonstrate abdominal breathing for support of phonation with 80% accuracy given minimal cues    STG 2: Resonance: Pt will balance oral/nasal resonance to maximize vocal output with minimal effort/laryngeal hyperfunction with 80% accuracy given minimal cues    STG 3: Strength/balance: Pt will strengthen and balance laryngeal musculature for support of connected speech with 80% accuracy given minimal cues    STG 4: Inflection: Pt. will increase inflection, improve phonatory flexibility, and increase laryngeal control without laryngeal hyperfunction with 80% accuracy given minimal cues                        Time Frame: 3  Duration: months    LTG #1: At the completion of Tx, Pt will demonstrate ability to perform voice therapy techniques independently to maintain optimal laryngeal function (+/-)                                       Time Frame: 3  Duration: months       SUBJECTIVE:  He is avoiding being social because of his voice  Pain?: No      Precautions: Falls      Education Provided:  Patient, Family    Response to Education: Understanding verbalized          Session Duration : 30    Today's Charges (noted here with $$):  SLP Treatments  $$ Treatment S/L/V - individual [mins]: 30                   I attest that I have reviewed the above information.  Signed: Genene Churn, SLP  01/31/2019 8:57 AM    Patient removed their mask during the session to perform straw exercises. SLP wore mask and eye protection throughout session.

## 2019-02-12 ENCOUNTER — Ambulatory Visit: Admit: 2019-02-12 | Discharge: 2019-02-13 | Payer: MEDICARE | Attending: Internal Medicine | Primary: Internal Medicine

## 2019-02-12 DIAGNOSIS — N183 Chronic kidney disease, stage 3 (moderate): Principal | ICD-10-CM

## 2019-02-12 DIAGNOSIS — I10 Essential (primary) hypertension: Principal | ICD-10-CM

## 2019-02-12 DIAGNOSIS — N400 Enlarged prostate without lower urinary tract symptoms: Principal | ICD-10-CM

## 2019-02-12 DIAGNOSIS — Z944 Liver transplant status: Principal | ICD-10-CM

## 2019-02-12 MED ORDER — CHLORTHALIDONE 25 MG TABLET
ORAL_TABLET | Freq: Every morning | ORAL | 2 refills | 60.00000 days | Status: CP
Start: 2019-02-12 — End: 2020-02-12
  Filled 2019-02-14: qty 30, 60d supply, fill #0

## 2019-02-12 NOTE — Unmapped (Addendum)
It was a pleasure seeing you in clinic today.  - Start taking Chlorthalidone 12.5mg  (1/2 tablet) in the morning  - Obtain blood work 2 weeks after starting this medication  - Please record your blood pressures 3 times weekly    If you have any questions, the contact info for the clinic is listed below or you can send me a MyChart message at any time:    Nygel Prokop S. Bryson Corona, M.D.  Assistant Professor  [facility]    [facility]  3rd Floor, Ambulatory Care Center  [address]   [id number]   [place], [place] [address]  Phone: [phone]  Fax: [phone]

## 2019-02-12 NOTE — Unmapped (Signed)
rnAOBP   Patient's blood pressure was taken on left upper arm, medium cuff.   1st reading 155/82, pulse: 63  2nd reading 158/78, pulse: 64  3rd reading 150/81, pulse: 64  Average reading 154/80, pulse: 64

## 2019-02-12 NOTE — Unmapped (Signed)
Referring Provider: [name], MD     PCP: Jeffrey Sites, MD    02/12/2019    Chief Complaint: Chronic Kidney Disease    HPI: [name] is a/an 68 y.o. 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 In review of the patient's medical record, the patient's creatinine was 1.3 in July 2018 and found to be 2.0 in August 2020. He was diagnosed with 2017 during a hospital admission, but likely had this for some time. He does have a history of retinopathy or neuropathy. He denies a family history of kidney disease and denies a personal history of frequent urinary tract infections or excessive NSAID. Does report a remote history of nephrolithiasis.    Today, he feels well but has been quite busy with driving his wife for cancer treatments at [facility]. Otherwise, he denies fevers, chills, headaches, chest pain, nausea, vomiting, abdominal pain, dysuria, hematuria, ulcers, syncope or seizures. Does report weight gain over the past 6 months and mild intermittent edema in his lower extremities.       ROS: 11 systems reviewed and negative except those noted in the history of present illness    PAST MEDICAL HISTORY:  Past Medical History:   Diagnosis Date   ??? Atrial fibrillation (CMS-HCC)    ??? Basal cell carcinoma    ??? Cancer (CMS-HCC)    ??? Cirrhosis (CMS-HCC)    ??? Depression    ??? Diabetes (CMS-HCC)    ??? Headache    ??? Hepatitis C 07/17/2012   ??? History of transfusion    ??? Liver disease    ??? Low back pain 07/17/2012   ??? Varices, esophageal (CMS-HCC)        ALLERGIES  Patient has no known allergies.    SOCIAL HISTORY  Social History     Socioeconomic History   ??? Marital status: Married     Spouse name: Not on file   ??? Number of children: Not on file   ??? Years of education: Not on file   ??? Highest education level: Not on file   Occupational History   ??? Not on file   Social Needs   ??? Financial resource strain: Not on file   ??? Food insecurity     Worry: Not on file     Inability: Not on file   ??? Transportation needs     Medical: Not on file     Non-medical: Not on file   Tobacco Use   ??? Smoking status: Never Smoker   ??? Smokeless tobacco: Never Used   ??? Tobacco comment: Smoked in high school for about 5 years.    Substance and Sexual Activity   ??? Alcohol use: No   ??? Drug use: No   ??? Sexual activity: Yes     Partners: Female     Birth control/protection: None   Lifestyle   ??? Physical activity     Days per week: Not on file     Minutes per session: Not on file   ??? Stress: Not on file   Relationships   ??? Social Wellsite geologist on phone: Not on file     Gets together: Not on file     Attends religious service: Not on file  Active member of club or organization: Not on file     Attends meetings of clubs or organizations: Not on file     Relationship status: Not on file   Other Topics Concern   ??? Do you use sunscreen? Not Asked   ??? Tanning bed use? Not Asked   ??? Are you easily burned? Not Asked   ??? Excessive sun exposure? Not Asked   ??? Blistering sunburns? Not Asked   ??? Exercise No   ??? Living Situation No   Social History Narrative   ??? Not on file         FAMILY HISTORY  Family History   Problem Relation Age of Onset   ??? Hypertension Mother    ??? Cancer Mother    ??? Heart disease Mother    ??? Heart disease Father    ??? Cirrhosis Neg Hx    ??? Liver cancer Neg Hx    ??? Anemia Neg Hx    ??? Diabetes Neg Hx    ??? Kidney disease Neg Hx    ??? Obesity Neg Hx    ??? Thyroid disease Neg Hx    ??? Osteoporosis Neg Hx    ??? Coronary artery disease Neg Hx    ??? Anesthesia problems Neg Hx    ??? Basal cell carcinoma Neg Hx    ??? Squamous cell carcinoma Neg Hx         MEDICATIONS:  Current Outpatient Medications   Medication Sig Dispense Refill   ??? alendronate (FOSAMAX) 70 MG tablet Take 1 tablet (70 mg total) by mouth every seven (7) days. 4 tablet 11   ??? bisacodyl (DULCOLAX, BISACODYL,) 5 mg EC tablet Take 5 mg by mouth daily as needed.      ??? blood sugar diagnostic Strp Use three (3) times a day before meals. 100 each 7   ??? blood-glucose meter (GLUCOSE MONITORING KIT) kit Use as instructed 1 each 0   ??? calcium carbonate (OS-CAL) 500 mg calcium (1,250 mg) chewable tablet Chew 2 tablets Two (2) times a day.      ??? chlorthalidone (HYGROTON) 25 MG tablet Take 1/2 tablet (12.5 mg total) by mouth every morning. 30 tablet 2   ??? cholecalciferol, vitamin D3, 125 mcg (5,000 unit) tablet Take 125 mcg by mouth daily.     ??? cycloSPORINE modified (NEORAL) 100 MG capsule Take 2 capsules (200 mg) by mouth in the morning and 1 capsule in the evening with three 25mg  capsules (For a total daily dose of 200mg  each morning and 175mg  every evening) 90 each 11   ??? cycloSPORINE modified (NEORAL) 25 MG capsule Take 3 capsules (75mg ) by mouth every evening with a 100mg  capsule every evening (For a total dose of 175mg  every evening) 90 each 11   ??? glimepiride (AMARYL) 4 MG tablet Take 4 mg by mouth daily.      ??? insulin glargine (BASAGLAR KWIKPEN U-100 INSULIN) 100 unit/mL (3 mL) injection pen Inject 0.12 mL (12 Units total) under the skin nightly. 15 mL 12   ??? insulin lispro (HUMALOG KWIKPEN INSULIN) 100 unit/mL injection pen Inject 4 Units under the skin Three (3) times a day before meals. 15 mL 12   ??? magnesium oxide-Mg AA chelate (MAGNESIUM, AMINO ACID CHELATE,) 133 mg Tab Take 2 tablets by mouth Two (2) times a day. 100 tablet PRN   ??? melatonin 3 mg Tab Take 1 tablet (3 mg total) by mouth nightly. 30 tablet 0   ??? mirtazapine (  REMERON) 15 MG tablet TAKE 1 TABLET BY MOUTH NIGHTLY 90 tablet 3   ??? mycophenolate (CELLCEPT) 250 mg capsule Take 2 capsules (500 mg total) by mouth two (2) times a day. 120 capsule 7   ??? pen needle, diabetic 32 gauge x 5/32 (4 mm) Ndle USE WITH INSULIN PEN 4 TIMES DAILY AS DIRECTED 100 each 1   ??? predniSONE (DELTASONE) 5 MG tablet Take 4 tablets (20 mg) by mouth for one day, then take 3 tablets (15 mg) the next day, then take 2 tablets (10 mg) the next day, then take 1 tablet (5mg ) then stop 10 tablet 0   ??? pregabalin (LYRICA) 75 MG capsule Take 1 capsule (75 mg total) by mouth 2 (two) times daily (Patient taking differently: Take 75 mg by mouth daily as needed. ) 60 capsule 6   ??? sildenafiL, pulm.hypertension, (REVATIO) 20 mg tablet Take 2 tablets (40mg ) by G-tube 3 times daily. 180 tablet 6   ??? sodium bicarbonate 650 mg tablet Take 1 tablet (650 mg total) by mouth two (2) times a day. 100 tablet 6     No current facility-administered medications for this visit.        PHYSICAL EXAM:     Vitals:    02/12/19 0932   BP: 154/80   Pulse: 64   Temp: 36.5 ??C (97.7 ??F)     CONSTITUTIONAL: Alert, no distress  HEENT: Moist mucous membranes, oropharynx clear without erythema or exudate  EYES: Extra ocular movements intact. Pupils reactive, sclerae anicteric.  NECK: Supple, no lymphadenopathy  CARDIOVASCULAR: Regular, normal S1/S2 heart sounds, no murmurs, no rubs.   PULM: Clear to auscultation bilaterally  GASTROINTESTINAL: Soft, active bowel sounds, nontender  EXTREMITIES: Trace lower extremity edema bilaterally.   SKIN: No rashes or lesions  NEUROLOGIC: Uses cane for ambulation   PSYCH: Alert and oriented x 3    MEDICAL DECISION MAKING    Results for orders placed or performed in visit on 02/12/19   POCT Urinalysis Dipstick   Result Value Ref Range    Spec Gravity/POC 1.025 1.003 - 1.030    PH/POC 5.5 5.0 - 9.0    Leuk Esterase/POC Negative Negative    Nitrite/POC Negative Negative    Protein/POC Negative Negative    UA Glucose/POC Negative Negative    Ketones, POC Negative Negative    Bilirubin/POC Negative Negative    Blood/POC Trace-intact (A) Negative    Urobilinogen/POC 0.2 0.2 - 1.0 mg/dL     *Note: Due to a large number of results and/or encounters for the requested time period, some results have not been displayed. A complete set of results can be found in Results Review.        Creatinine Whole Blood, POC   Date Value Ref Range Status   09/25/2015 1.0 0.8 - 1.4 mg/dL Final   16/12/9602 0.9 0.8 - 1.4 mg/dL Final     Creatinine   Date Value Ref Range Status   01/11/2019 1.95 (H) 0.70 - 1.30 mg/dL Final   54/11/8117 1.47 (H) 0.70 - 1.30 mg/dL Final   82/95/6213 0.86 (H) 0.70 - 1.30 mg/dL Final   57/84/6962 9.52 (H) 0.70 - 1.30 mg/dL Final   84/13/2440 1.02 (H) mg/dL Final   72/53/6644 0.34 0.70 - 1.30 mg/dL Final   74/25/9563 8.75 0.70 - 1.30 mg/dL Final   64/33/2951 8.84 0.70 - 1.30 mg/dL Final   16/60/6301 6.01 0.70 - 1.30 mg/dL Final   09/32/3557 3.22 0.70 - 1.30 mg/dL Final  Creatinine WB   Date Value Ref Range Status   12/27/2018 2.2 (H) 0.8 - 1.4 mg/dL Final     EST.GFR (MDRD)   Date Value Ref Range Status   02/15/2012 >= 60 >=60 mL/min/1.21m2 Final   08/17/2011 >= 60 >=60 mL/min/1.7m2 Final   04/20/2011 >= 60 >=60 mL/min/1.13m2 Final   11/10/2010 >= 60 >=60 mL/min/1.63m2 Final   07/21/2010 >= 60 >=60 mL/min/1.90m2 Final      BUN   Date Value Ref Range Status   01/11/2019 37 (H) 7 - 21 mg/dL Final   16/12/9602 47 (H) 7 - 21 mg/dL Final   54/11/8117 41 (H) 7 - 21 mg/dL Final   14/78/2956 37 (H) 7 - 21 mg/dL Final   21/30/8657 34 (H) mg/dL Final   84/69/6295 41 (H) mg/dL Final   28/41/3244 42 (H) 7 - 21 mg/dL Final   03/16/7251 30 (H) mg/dL Final   66/44/0347 12 7 - 21 mg/dL Final   42/59/5638 14 7 - 21 mg/dL Final      Phosphorus   Date Value Ref Range Status   01/11/2019 2.7 (L) 2.9 - 4.7 mg/dL Final   75/64/3329 3.5 2.9 - 4.7 mg/dL Final   51/88/4166 3.0 2.9 - 4.7 mg/dL Final   09/11/1599 3.5 2.9 - 4.7 mg/dL Final   09/32/3557 2.9 2.9 - 4.7 mg/dL Final      Calcium   Date Value Ref Range Status   01/11/2019 9.8 8.5 - 10.2 mg/dL Final   32/20/2542 9.5 8.5 - 10.2 mg/dL Final   70/62/3762 9.9 8.5 - 10.2 mg/dL Final   83/15/1761 9.7 8.5 - 10.2 mg/dL Final   60/73/7106 26.9 mg/dL Final   48/54/6270 9.3 8.5 - 10.2 mg/dL Final   35/00/9381 9.6 8.5 - 10.2 mg/dL Final 82/99/3716 9.5 8.5 - 10.2 mg/dL Final   96/78/9381 8.6 8.5 - 10.2 MG/DL Final   01/75/1025 8.7 8.5 - 10.2 MG/DL Final      No results found for: PTH   HGB   Date Value Ref Range Status   01/11/2019 12.6 (L) 13.5 - 17.5 g/dL Final   85/27/7824 23.5 (L) 13.5 - 17.5 g/dL Final   36/14/4315 40.0 (L) 13.5 - 17.5 g/dL Final   86/76/1950 93.2 (L) g/dL Final   67/02/4579 99.8 (L) g/dL Final     Hemoglobin   Date Value Ref Range Status   09/26/2016 6.2 (L) 13.5 - 17.5 g/dL Final     Comment:     Point of Care Testing performed at the point of care by trained personnel per documented policies.   09/18/2016 9.7 (L) 13.5 - 17.5 g/dL Final     Comment:     Point of Care Testing performed at the point of care by trained personnel per documented policies.   09/18/2016 9.6 (L) 13.5 - 17.5 g/dL Final     Comment:     Point of Care Testing performed at the point of care by trained personnel per documented policies.   09/15/2016 9.8 (L) 13.5 - 17.5 g/dL Final     Comment:     Point of Care Testing performed at the point of care by trained personnel per documented policies.   09/15/2016 9.1 (L) 13.5 - 17.5 g/dL Final     Comment:     Point of Care Testing performed at the point of care by trained personnel per documented policies.      CO2   Date Value Ref Range Status   01/11/2019 25.0 22.0 -  30.0 mmol/L Final   11/08/2018 20.0 (L) 22.0 - 30.0 mmol/L Final   11/07/2018 20.0 (L) 22.0 - 30.0 mmol/L Final   10/01/2018 21.0 (L) 22.0 - 30.0 mmol/L Final   09/25/2018 254.0 mmol/L Final   10/10/2013 29 22 - 30 mmol/L Final   04/03/2013 28 22 - 30 mmol/L Final   07/25/2012 30 22 - 30 mmol/L Final   05/13/2010 30 22 - 30 MMOL/L Final         I have reviewed relevant outside healthcare records    Urine Sediment: Bland sediment, no formed cellular elements    IMAGING STUDIES:   04/2018 MR Abdomen with and without contrast:  LOWER CHEST:   The visualized portions of the lower chest are unremarkable.  ABDOMEN/PELVIS:  HEPATOBILIARY:  Patient is status-post liver transplantation. Several peripherally enhancing lesions are again seen within hepatic segments II and VIII, which are stable-to-slightly decreased in size. For reference:  - Slightly decreased size of largest hepatic segment VIII lesion (13:24) which measures 1.7 x 2.1 cm (transverse by AP). This lesion previously measured 2.3 x 2.1 cm.  No new focal hepatic lesions are identified. Gallbladder surgically absent. Mild intrahepatic biliary ductal dilatation is noted, which is improved from prior exam and thought secondary to postcholecystectomy state. No obstructing stone or mass lesion is identified.  PANCREAS: Pancreas is atrophic. Multiple nonenhancing cystic lesions are seen scattered throughout the pancreatic head and body, similar to prior. No new focal mass. No ductal dilatation.  SPLEEN: No focal parenchymal abnormality.  ADRENAL GLANDS: Unremarkable.  KIDNEYS/URETERS: Symmetric nephrograms. No enhancing renal mass. No hydronephrosis.  BOWEL/PERITONEUM/RETROPERITONEUM: No bowel obstruction. No acute inflammatory process. No free fluid.  VASCULATURE: Abdominal aorta within normal limits for patient's age. Unremarkable IVC. The portal, splenic, and superior mesenteric veins are patent.   LYMPH NODES: Previously described enlarged porta hepatis lymph node is no longer well-visualized. No new or enlarging lymphadenopathy is identified within the abdomen.  BONES/SOFT TISSUES: Posterior spinal fixation hardware with associated artifact somewhat limits evaluation of the adjacent osseous structures and soft tissues.   Gynecomastia. No soft tissue abnormality. No worrisome osseous lesion identified.  IMPRESSION:  - Unchanged to slightly decreased size of hepatic abscesses in segments 2 and 8. No new hepatic lesions identified.  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    ASSESSMENT/PLAN:  [name] is a/an 68 y.o. 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   1. Chronic Kidney Disease Stage III: The patient's renal dysfunction is most likely secondary to diabetic nephropathy and prior fluid shifts in the post-transplant requiring CRRT to manage pulmonary hypertension/fluid overload. His creatinine has ranged from 1.6-2 over the past year, most recently 1.9 from 01/11/19. Potassium improved to 5.1 with a bicarbonate level of 25. Continue sodium bicarbonate supplementation.Today's urine sample was devoid of any protein, only trace blood. The patient was counseled on avoiding nephrotoxic agents including but not limited to NSAIDs and contrasted studies.    2. Type 2 Diabetes: Most recent A1c 6.8%, continues to follow with [facility].    3. S/p Liver Transplant: Liver enzymes stable, platelets stable on 90. Currently on Cyclosporine, Cellcept, and Prednisone.   4. Atrial Fibrillation: Rate and rhythm controlled.   5. Hypertension: Has been elevated over the past few clinic visits and at home. Will start low dose Chlorthalidone and re-check labs 2-weeks post medication  initiation at [facility] to re-evaluate renal function and electrolytes.     > 50% of this 30 minute visit was spent in explaining diagnoses, answering questions, presenting treatment options and discussing the treatment plan as described above.     [name] will follow up in 4 months

## 2019-02-14 MED FILL — CHLORTHALIDONE 25 MG TABLET: 60 days supply | Qty: 30 | Fill #0 | Status: AC

## 2019-02-14 NOTE — Unmapped (Signed)
Called patient reminding to get labs and left VM instruction to ensure reliable drug trough.

## 2019-02-15 NOTE — Unmapped (Signed)
[facility] SPEECH [address]  OUTPATIENT SPEECH PATHOLOGY  02/15/2019      Patient Name: Jeffrey Ward  Date of Birth:07/13/1950  Session Number: 3  Diagnosis:   Encounter Diagnosis   Name Primary?   ??? Dysphonia Yes           Date of Evaluation: 12/31/18  Date of Symptom Onset: 09/15/16  Referred by: Martina Sinner, MD      Surgery date (if applicable): 09/15/16  Dates of Certification: Expires April 02, 2019    Chief Complaint: Dysphonia    Note Type: Treatment Note    ASSESSMENT:     Next Visit Plan: abdominal support of phonation, resonant voice, balanced phonation, easy onset, negative practice, conversation training    OBJECTIVE:     80% accurate for engaging lower abdominal muscles during hiss and buzz exercises given max cues  Introduced hard glottal to improve vocal fold adduction for sustained phonation and connected speech.  Given max cues and clinician modeling, patient was able to decrease breathy vocal quality in 70% of observable opportunities today.  He had trouble generalizing this to conversational speech.  Easy onset worksheet for home practice provided.    Stimulability: Pt was somewhat stimulable  Treatment Recommendations: Continue Treatment    PLAN:  SLP Follow-up / Frequency: 1x week for Planned Treatment Duration : 8 sessions      Planned Interventions: Resonant Voice Therapy, Vocal Function Exercises, Diaphragmatic breathing/respiratory retraining, Health visitor Voice, Semi-occluded Vocal Tract Exercises, Financial controller, Functional Phonation Tasks    Prognosis:  Good    Negative Prognosis Rationale: Time post onset, Severity of deficits       Positive Prognosis Rationale: Previous tx with benefit, Motivation      Goals:        STG 1: Breathing: Pt will demonstrate abdominal breathing for support of phonation with 80% accuracy given minimal cues STG 2: Resonance: Pt will balance oral/nasal resonance to maximize vocal output with minimal effort/laryngeal hyperfunction with 80% accuracy given minimal cues    STG 3: Strength/balance: Pt will strengthen and balance laryngeal musculature for support of connected speech with 80% accuracy given minimal cues    STG 4: Inflection: Pt. will increase inflection, improve phonatory flexibility, and increase laryngeal control without laryngeal hyperfunction with 80% accuracy given minimal cues                        Time Frame: 3  Duration: months    LTG #1: At the completion of Tx, Pt will demonstrate ability to perform voice therapy techniques independently to maintain optimal laryngeal function (+/-)                                       Time Frame: 3  Duration: months       SUBJECTIVE:  Still feels like his voice is coming out of his throat  Pain?: No      Precautions: Falls      Education Provided: Patient, Family    Response to Education: Understanding verbalized          Session Duration : 45    I attest that I have reviewed the above information.  Signed: Genene Churn, SLP  02/15/2019 3:11 PM     I spent 45 minutes on the real-time audio and video with the patient. I spent an additional 5  minutes on pre- and post-visit activities.     The patient was physically located in [place] or a state in which I am permitted to provide care. The patient and/or parent/guardian understood that s/he may incur co-pays and cost sharing, and agreed to the telemedicine visit. The visit was reasonable and appropriate under the circumstances given the patient's presentation at the time.    The patient and/or parent/guardian has been advised of the potential risks and limitations of this mode of treatment (including, but not limited to, the absence of in-person examination) and has agreed to be treated using telemedicine. The patient's/patient's family's questions regarding telemedicine have been answered. If the visit was completed in an ambulatory setting, the patient and/or parent/guardian has also been advised to contact their provider???s office for worsening conditions, and seek emergency medical treatment and/or call 911 if the patient deems either necessary.

## 2019-02-19 ENCOUNTER — Ambulatory Visit: Admit: 2019-02-19 | Discharge: 2019-02-20 | Payer: MEDICARE

## 2019-02-19 DIAGNOSIS — Z944 Liver transplant status: Principal | ICD-10-CM

## 2019-02-19 DIAGNOSIS — D899 Disorder involving the immune mechanism, unspecified: Principal | ICD-10-CM

## 2019-02-19 DIAGNOSIS — E875 Hyperkalemia: Principal | ICD-10-CM

## 2019-02-19 LAB — CBC W/ AUTO DIFF
BASOPHILS ABSOLUTE COUNT: 0 10*9/L (ref 0.0–0.1)
BASOPHILS RELATIVE PERCENT: 0.4 %
EOSINOPHILS ABSOLUTE COUNT: 0.1 10*9/L (ref 0.0–0.7)
EOSINOPHILS RELATIVE PERCENT: 1.5 %
HEMATOCRIT: 40.4 % (ref 38.0–50.0)
HEMOGLOBIN: 13.6 g/dL (ref 13.5–17.5)
LYMPHOCYTES ABSOLUTE COUNT: 1.3 10*9/L (ref 0.7–4.0)
LYMPHOCYTES RELATIVE PERCENT: 28.4 %
MEAN CORPUSCULAR HEMOGLOBIN CONC: 33.8 g/dL (ref 30.0–36.0)
MEAN CORPUSCULAR HEMOGLOBIN: 30.7 pg (ref 26.0–34.0)
MEAN CORPUSCULAR VOLUME: 91 fL (ref 81.0–95.0)
MEAN PLATELET VOLUME: 10.1 fL — ABNORMAL HIGH (ref 7.0–10.0)
MONOCYTES ABSOLUTE COUNT: 0.5 10*9/L (ref 0.1–1.0)
MONOCYTES RELATIVE PERCENT: 10.8 %
NEUTROPHILS RELATIVE PERCENT: 58.9 %
RED BLOOD CELL COUNT: 4.44 10*12/L (ref 4.32–5.72)
RED CELL DISTRIBUTION WIDTH: 13.2 % (ref 12.0–15.0)
WBC ADJUSTED: 4.6 10*9/L (ref 3.5–10.5)

## 2019-02-19 LAB — COMPREHENSIVE METABOLIC PANEL
ALBUMIN: 4.2 g/dL (ref 3.5–5.0)
ALKALINE PHOSPHATASE: 81 U/L (ref 38–126)
ALT (SGPT): 13 U/L (ref <50)
ANION GAP: 7 mmol/L (ref 7–15)
AST (SGOT): 27 U/L (ref 19–55)
BILIRUBIN TOTAL: 0.8 mg/dL (ref 0.0–1.2)
BLOOD UREA NITROGEN: 44 mg/dL — ABNORMAL HIGH (ref 7–21)
BUN / CREAT RATIO: 21
CHLORIDE: 109 mmol/L — ABNORMAL HIGH (ref 98–107)
CO2: 25 mmol/L (ref 22.0–30.0)
CREATININE: 2.07 mg/dL — ABNORMAL HIGH (ref 0.70–1.30)
EGFR CKD-EPI NON-AA MALE: 32 mL/min/{1.73_m2} — ABNORMAL LOW (ref >=60)
GLUCOSE RANDOM: 120 mg/dL (ref 70–179)
POTASSIUM: 5.8 mmol/L — ABNORMAL HIGH (ref 3.5–5.0)
PROTEIN TOTAL: 6.6 g/dL (ref 6.5–8.3)
SODIUM: 141 mmol/L (ref 135–145)

## 2019-02-19 LAB — GAMMA GT: GAMMA GLUTAMYL TRANSFERASE: 34 U/L (ref 12–109)

## 2019-02-19 LAB — EOSINOPHILS ABSOLUTE COUNT: Eosinophils:NCnc:Pt:Bld:Qn:Automated count: 0.1

## 2019-02-19 LAB — BILIRUBIN DIRECT: Bilirubin.glucuronidated+Bilirubin.albumin bound:MCnc:Pt:Ser/Plas:Qn:: <0.1

## 2019-02-19 LAB — POTASSIUM: Potassium:SCnc:Pt:Ser/Plas:Qn:: 5.8 — ABNORMAL HIGH

## 2019-02-19 LAB — MAGNESIUM: Magnesium:MCnc:Pt:Ser/Plas:Qn:: 1.8

## 2019-02-19 LAB — PHOSPHORUS: Phosphate:MCnc:Pt:Ser/Plas:Qn:: 4.1

## 2019-02-19 LAB — GAMMA GLUTAMYL TRANSFERASE: Gamma glutamyl transferase:CCnc:Pt:Ser/Plas:Qn:: 34

## 2019-02-19 MED ORDER — SPS (WITH SORBITOL) 15 GRAM-20 GRAM/60 ML ORAL SUSPENSION
Freq: Every day | ORAL | 0 refills | 2 days | Status: CP
Start: 2019-02-19 — End: 2019-02-21
  Filled 2019-02-19: qty 240, 2d supply, fill #0

## 2019-02-19 MED FILL — SPS (WITH SORBITOL) 15 GRAM-20 GRAM/60 ML ORAL SUSPENSION: 2 days supply | Qty: 240 | Fill #0 | Status: AC

## 2019-02-19 NOTE — Unmapped (Signed)
Called and wife stated she is no longer on Chemo but she wants me to talk with patient directly.        Per PharmD Mincemoyer patient to take 30 grams kayexalate today and tomorrow.  Patient denied any use of potassium containing foods.  Sent e-script to [facility] and he agreed to pick it up today.    Per patient request sent MyChart message with potassium food list.    Also sent phone number of spine center [phone] to call to make an appointment with Kristopher Prudy Feeler, MD.  Patient asked for a referral for physical therapy and I directed him to talk with this provider about this so he can be referred to someone who is able to help him.      Patient confirmed tacrolimus level was reliable since he took his medication last night at 11 pm and  Today after lab draw.      Spoke with pharmacy technician [facility] and provided verbal order to switch over to the SPS solution since that was the type of formula they have in stock for patient.

## 2019-02-19 NOTE — Unmapped (Signed)
[facility] Clinical Assessment & Refill Coordination Note    Jeffrey Ward, DOB: January 05, 1951  Phone: [phone] (home) [phone] (work)    All above HIPAA information was verified with patient's family member, wife Jeffrey Ward.     Was a Nurse, learning disability used for this call? 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 alendronate (FOSAMAX) 70 MG tablet Take 1 tablet (70 mg total) by mouth every seven (7) days. 4 tablet 11   ??? bisacodyl (DULCOLAX, BISACODYL,) 5 mg EC tablet Take 5 mg by mouth daily as needed.      ??? blood sugar diagnostic Strp Use three (3) times a day before meals. 100 each 7   ??? blood-glucose meter (GLUCOSE MONITORING KIT) kit Use as instructed 1 each 0   ??? calcium carbonate (OS-CAL) 500 mg calcium (1,250 mg) chewable tablet Chew 2 tablets Two (2) times a day.      ??? chlorthalidone (HYGROTON) 25 MG tablet Take 1/2 tablet (12.5 mg total) by mouth every morning. 30 tablet 2   ??? cholecalciferol, vitamin D3, 125 mcg (5,000 unit) tablet Take 125 mcg by mouth daily.     ??? cycloSPORINE modified (NEORAL) 100 MG capsule Take 2 capsules (200 mg) by mouth in the morning and 1 capsule in the evening with three 25mg  capsules (For a total daily dose of 200mg  each morning and 175mg  every evening) 90 each 11   ??? cycloSPORINE modified (NEORAL) 25 MG capsule Take 3 capsules (75mg ) by mouth every evening with a 100mg  capsule every evening (For a total dose of 175mg  every evening) 90 each 11   ??? glimepiride (AMARYL) 4 MG tablet Take 4 mg by mouth daily.      ??? insulin glargine (BASAGLAR KWIKPEN U-100 INSULIN) 100 unit/mL (3 mL) injection pen Inject 0.12 mL (12 Units total) under the skin nightly. 15 mL 12   ??? insulin lispro (HUMALOG KWIKPEN INSULIN) 100 unit/mL injection pen Inject 4 Units under the skin Three (3) times a day before meals. 15 mL 12 ??? magnesium oxide-Mg AA chelate (MAGNESIUM, AMINO ACID CHELATE,) 133 mg Tab Take 2 tablets by mouth Two (2) times a day. 100 tablet PRN   ??? melatonin 3 mg Tab Take 1 tablet (3 mg total) by mouth nightly. 30 tablet 0   ??? mirtazapine (REMERON) 15 MG tablet TAKE 1 TABLET BY MOUTH NIGHTLY 90 tablet 3   ??? mycophenolate (CELLCEPT) 250 mg capsule Take 2 capsules (500 mg total) by mouth two (2) times a day. 120 capsule 7   ??? pen needle, diabetic 32 gauge x 5/32 (4 mm) Ndle USE WITH INSULIN PEN 4 TIMES DAILY AS DIRECTED 100 each 1   ??? predniSONE (DELTASONE) 5 MG tablet Take 4 tablets (20 mg) by mouth for one day, then take 3 tablets (15 mg) the next day, then take 2 tablets (10 mg) the next day, then take 1 tablet (5mg ) then stop 10 tablet 0   ??? pregabalin (LYRICA) 75 MG capsule Take 1 capsule (75 mg total) by mouth 2 (two) times daily (Patient taking differently: Take 75 mg by mouth daily as needed. ) 60 capsule 6   ??? sildenafiL, pulm.hypertension, (REVATIO) 20 mg tablet Take 2 tablets (40mg ) by G-tube 3 times daily. 180 tablet 6   ??? sodium bicarbonate 650 mg tablet Take 1 tablet (650 mg total)  by mouth two (2) times a day. 100 tablet 6     No current facility-administered medications for this visit.         Changes to medications: Jeffrey Ward reports no changes at this time.    No Known Allergies    Changes to allergies: No    SPECIALTY MEDICATION ADHERENCE     Cyclosporine 100mg   : 12 days of medicine on hand   Cyclosporine 25mg   : 12 days of medicine on hand   Mycophenolate 250mg   : 12 days of medicine on hand        Medication Adherence    Patient reported X missed doses in the last month: 0  Specialty Medication: cyclosporine 100mg   Patient is on additional specialty medications: Yes  Additional Specialty Medications: Cyclosporine 25mg   Patient Reported Additional Medication X Missed Doses in the Last Month: 0  Patient is on more than two specialty medications: Yes  Specialty Medication: mycophenolate 250mg  Patient Reported Additional Medication X Missed Doses in the Last Month: 0  Support network for adherence: family member          Specialty medication(s) dose(s) confirmed: Regimen is correct and unchanged.     Are there any concerns with adherence? No    Adherence counseling provided? Not needed    CLINICAL MANAGEMENT AND INTERVENTION      Clinical Benefit Assessment:    Do you feel the medicine is effective or helping your condition? Yes    Clinical Benefit counseling provided? Not needed    Adverse Effects Assessment:    Are you experiencing any side effects? No    Are you experiencing difficulty administering your medicine? No    Quality of Life Assessment:    How many days over the past month did your transplant  keep you from your normal activities? For example, brushing your teeth or getting up in the morning. 0    Have you discussed this with your provider? Not needed    Therapy Appropriateness:    Is therapy appropriate? Yes, therapy is appropriate and should be continued    DISEASE/MEDICATION-SPECIFIC INFORMATION      N/A    PATIENT SPECIFIC NEEDS     ? Does the patient have any physical, cognitive, or cultural barriers? No    ? Is the patient high risk? Yes, patient is taking a REMS drug. Medication is dispensed in compliance with REMS program.     ? Does the patient require a Care Management Plan? No     ? Does the patient require physician intervention or other additional services (i.e. nutrition, smoking cessation, social work)? No      SHIPPING     Specialty Medication(s) to be Shipped:   Transplant: mycophenolate mofetil 100mg , cyclosporine 25mg  and cyclosporine 250mg     Other medication(s) to be shipped: fosamax, mgplus, lyrica, revatio, pen needles= 8rx total     Changes to insurance: No    Delivery Scheduled: Yes, Expected medication delivery date: 02/26/2019.     Medication will be delivered via UPS to the confirmed prescription address in [facility]. The patient will receive a drug information handout for each medication shipped and additional FDA Medication Guides as required.  Verified that patient has previously received a Conservation officer, historic buildings.    All of the patient's questions and concerns have been addressed.    Thad Ranger   [facility] Specialty Pharmacist

## 2019-02-20 LAB — CYCLOSPORINE, TROUGH: Lab: 100

## 2019-02-25 ENCOUNTER — Institutional Professional Consult (permissible substitution): Admit: 2019-02-25 | Discharge: 2019-02-25 | Payer: MEDICARE

## 2019-02-25 ENCOUNTER — Ambulatory Visit: Admit: 2019-02-25 | Discharge: 2019-02-25 | Payer: MEDICARE

## 2019-02-25 DIAGNOSIS — E875 Hyperkalemia: Principal | ICD-10-CM

## 2019-02-25 DIAGNOSIS — Z45018 Encounter for adjustment and management of other part of cardiac pacemaker: Principal | ICD-10-CM

## 2019-02-25 DIAGNOSIS — K769 Liver disease, unspecified: Principal | ICD-10-CM

## 2019-02-25 DIAGNOSIS — Z944 Liver transplant status: Principal | ICD-10-CM

## 2019-02-25 DIAGNOSIS — D899 Disorder involving the immune mechanism, unspecified: Principal | ICD-10-CM

## 2019-02-25 LAB — COMPREHENSIVE METABOLIC PANEL
ALBUMIN: 3.8 g/dL (ref 3.5–5.0)
ALKALINE PHOSPHATASE: 84 U/L (ref 38–126)
ALT (SGPT): 10 U/L (ref <50)
ANION GAP: 6 mmol/L — ABNORMAL LOW (ref 7–15)
BILIRUBIN TOTAL: 0.8 mg/dL (ref 0.0–1.2)
BUN / CREAT RATIO: 23
CALCIUM: 9.8 mg/dL (ref 8.5–10.2)
CHLORIDE: 111 mmol/L — ABNORMAL HIGH (ref 98–107)
CO2: 26 mmol/L (ref 22.0–30.0)
CREATININE: 1.88 mg/dL — ABNORMAL HIGH (ref 0.70–1.30)
EGFR CKD-EPI NON-AA MALE: 35 mL/min/{1.73_m2} — ABNORMAL LOW (ref >=60)
GLUCOSE RANDOM: 97 mg/dL (ref 70–179)
POTASSIUM: 5.3 mmol/L — ABNORMAL HIGH (ref 3.5–5.0)
PROTEIN TOTAL: 6.2 g/dL — ABNORMAL LOW (ref 6.5–8.3)
SODIUM: 143 mmol/L (ref 135–145)

## 2019-02-25 LAB — BILIRUBIN DIRECT: Bilirubin.glucuronidated+Bilirubin.albumin bound:MCnc:Pt:Ser/Plas:Qn:: 0.4

## 2019-02-25 LAB — POTASSIUM: Potassium:SCnc:Pt:Ser/Plas:Qn:: 5.3 — ABNORMAL HIGH

## 2019-02-25 LAB — RED BLOOD CELL COUNT: Lab: 4.11 — ABNORMAL LOW

## 2019-02-25 LAB — IRON PANEL
IRON SATURATION (CALC): 43 % (ref 20–50)
IRON: 115 ug/dL (ref 35–165)
TOTAL IRON BINDING CAPACITY (CALC): 264.5 mg/dL (ref 252.0–479.0)

## 2019-02-25 LAB — CBC
HEMATOCRIT: 37.3 % — ABNORMAL LOW (ref 38.0–50.0)
HEMOGLOBIN: 12.7 g/dL — ABNORMAL LOW (ref 13.5–17.5)
MEAN CORPUSCULAR HEMOGLOBIN CONC: 33.9 g/dL (ref 30.0–36.0)
MEAN PLATELET VOLUME: 9.6 fL (ref 7.0–10.0)
PLATELET COUNT: 85 10*9/L — ABNORMAL LOW (ref 150–450)
RED BLOOD CELL COUNT: 4.11 10*12/L — ABNORMAL LOW (ref 4.32–5.72)
RED CELL DISTRIBUTION WIDTH: 13.2 % (ref 12.0–15.0)
WBC ADJUSTED: 4.3 10*9/L (ref 3.5–10.5)

## 2019-02-25 LAB — MAGNESIUM: Magnesium:MCnc:Pt:Ser/Plas:Qn:: 2.1

## 2019-02-25 LAB — IRON: Iron:MCnc:Pt:Ser/Plas:Qn:: 115

## 2019-02-25 LAB — PHOSPHORUS
PHOSPHORUS: 3.1 mg/dL (ref 2.9–4.7)
Phosphate:MCnc:Pt:Ser/Plas:Qn:: 3.1

## 2019-02-25 LAB — GAMMA GLUTAMYL TRANSFERASE: Gamma glutamyl transferase:CCnc:Pt:Ser/Plas:Qn:: 30

## 2019-02-25 MED FILL — MG-PLUS-PROTEIN 133 MG TABLET: 25 days supply | Qty: 100 | Fill #11 | Status: AC

## 2019-02-25 MED FILL — CYCLOSPORINE MODIFIED 25 MG CAPSULE: 30 days supply | Qty: 90 | Fill #8 | Status: AC

## 2019-02-25 MED FILL — CYCLOSPORINE MODIFIED 100 MG CAPSULE: 30 days supply | Qty: 90 | Fill #8 | Status: AC

## 2019-02-25 MED FILL — PREGABALIN 75 MG CAPSULE: 30 days supply | Qty: 60 | Fill #3

## 2019-02-25 MED FILL — PREGABALIN 75 MG CAPSULE: 30 days supply | Qty: 60 | Fill #3 | Status: AC

## 2019-02-25 MED FILL — ALENDRONATE 70 MG TABLET: 28 days supply | Qty: 4 | Fill #1 | Status: AC

## 2019-02-25 MED FILL — SILDENAFIL (PULMONARY HYPERTENSION) 20 MG TABLET: 30 days supply | Qty: 180 | Fill #1

## 2019-02-25 MED FILL — MG-PLUS-PROTEIN 133 MG TABLET: ORAL | 25 days supply | Qty: 100 | Fill #11

## 2019-02-25 MED FILL — CYCLOSPORINE MODIFIED 100 MG CAPSULE: 30 days supply | Qty: 90 | Fill #8

## 2019-02-25 MED FILL — ULTICARE PEN NEEDLE 32 GAUGE X 5/32" (4 MM): 25 days supply | Qty: 100 | Fill #0 | Status: AC

## 2019-02-25 MED FILL — ULTICARE PEN NEEDLE 32 GAUGE X 5/32" (4 MM): 25 days supply | Qty: 100 | Fill #0

## 2019-02-25 MED FILL — CYCLOSPORINE MODIFIED 25 MG CAPSULE: 30 days supply | Qty: 90 | Fill #8

## 2019-02-25 MED FILL — SILDENAFIL (PULMONARY HYPERTENSION) 20 MG TABLET: 30 days supply | Qty: 180 | Fill #1 | Status: AC

## 2019-02-25 MED FILL — MYCOPHENOLATE MOFETIL 250 MG CAPSULE: ORAL | 30 days supply | Qty: 120 | Fill #2

## 2019-02-25 MED FILL — MYCOPHENOLATE MOFETIL 250 MG CAPSULE: 30 days supply | Qty: 120 | Fill #2 | Status: AC

## 2019-02-25 MED FILL — ALENDRONATE 70 MG TABLET: ORAL | 28 days supply | Qty: 4 | Fill #1

## 2019-02-25 NOTE — Unmapped (Signed)
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

## 2019-02-25 NOTE — Unmapped (Signed)
Cardiac Implantable Electronic Device Remote Monitoring     Visit Date:  02/25/2019    Findings: Normal device function, Tested Lead measurements stable and within normal range, Adequate battery reserve->8 years    Arrhythmias: No significant new atrial or ventricular arrhythmias    Plan: Continue Routine Remote Monitoring   __________________________________________________________________    Manufacturer of Device: Medtronic       Type of Device: Single Chamber Pacemaker  See scanned/downloaded PDF report for model numbers, serial numbers, and date(s) of implant.    Presenting Rhythm: VS    ______________________________________________________________________  Percentage Biventricular Pacing: n/a    Heart Failure Monitoring: Not Applicable  ______________________________________________________________________      Please see downloaded PDF file of transmission under Media Tab  for full details of device interrogation to include, when applicable,  battery status/charge time, lead trend data, and programmed parameters.

## 2019-02-26 LAB — PROTEIN ELECTROPHORESIS, SERUM
ALBUMIN (SPE): 3.9 g/dL (ref 3.5–5.0)
ALPHA-1 GLOBULIN: 0.3 g/dL (ref 0.2–0.5)
ALPHA-2 GLOBULIN: 0.8 g/dL (ref 0.5–1.1)
BETA-2 GLOBULIN: 0.2 g/dL (ref 0.2–0.6)
GAMMAGLOBULIN: 0.6 g/dL (ref 0.5–1.5)

## 2019-02-26 LAB — SPE INTERPRETATION: Lab: 0

## 2019-02-26 LAB — IMMUNOFIXATION ELECTROPHORESIS 1: Lab: 0

## 2019-02-27 LAB — K/L FLC RATIO: Lab: 1.44

## 2019-02-27 LAB — IMMUNOGLOBULIN FREE LT CHAINS BLOOD: LAMBDA FREE, SER: 1.27 mg/dL (ref 0.57–2.63)

## 2019-03-01 ENCOUNTER — Ambulatory Visit
Admit: 2019-03-01 | Discharge: 2019-03-22 | Payer: MEDICARE | Attending: Speech-Language Pathologist | Primary: Speech-Language Pathologist

## 2019-03-01 ENCOUNTER — Telehealth
Admit: 2019-03-01 | Discharge: 2019-03-22 | Payer: MEDICARE | Attending: Speech-Language Pathologist | Primary: Speech-Language Pathologist

## 2019-03-05 NOTE — Unmapped (Signed)
[facility] SPEECH [place] [address]  OUTPATIENT SPEECH PATHOLOGY  03/01/2019      Patient Name: Jeffrey Ward  Date of Birth:[birth year]  Session Number: 4  Diagnosis:   Encounter Diagnosis   Name Primary?   ??? Dysphonia Yes           Date of Evaluation: 12/31/18  Date of Symptom Onset: 09/15/16  Referred by: Martina Sinner, MD      Surgery date (if applicable): 09/15/16  Dates of Certification: Expires April 02, 2019    Chief Complaint: Dysphonia    Note Type: Treatment Note    ASSESSMENT:     Next Visit Plan: abdominal support of phonation, resonant voice, balanced phonation, easy onset, negative practice, conversation training    OBJECTIVE:        Patient was 70% accurate for increasing airflow to improve vocal fold adduction and voice quality given max cues.  60% accurate for improved resonance with minimal pairs improving to 70% given max cues and clinician modeling. All goals targeted through conversation therapy today.  Patient maintained forward resonance with adequate breath support while using inflection and maintaining balance of vocal fold closure and airflow with 70% accuracy given max cues today.  Patient continues to experience pharyngeal focus with higher pitch and spontaneous speech.  He is stimulable with cues to use a deeper voice like [facility] or the green giant to encourage laryngeal relaxation.        Stimulability: Pt was somewhat stimulable  Treatment Recommendations: Continue Treatment    PLAN:  SLP Follow-up / Frequency: 1x week for Planned Treatment Duration : 8 sessions      Planned Interventions: Resonant Voice Therapy, Vocal Function Exercises, Diaphragmatic breathing/respiratory retraining, Health visitor Voice, Semi-occluded Vocal Tract Exercises, Financial controller, Functional Phonation Tasks    Prognosis:  Good    Negative Prognosis Rationale: Time post onset, Severity of deficits       Positive Prognosis Rationale: Previous tx with benefit, Motivation Goals:        STG 1: Breathing: Pt will demonstrate abdominal breathing for support of phonation with 80% accuracy given minimal cues    STG 2: Resonance: Pt will balance oral/nasal resonance to maximize vocal output with minimal effort/laryngeal hyperfunction with 80% accuracy given minimal cues    STG 3: Strength/balance: Pt will strengthen and balance laryngeal musculature for support of connected speech with 80% accuracy given minimal cues    STG 4: Inflection: Pt. will increase inflection, improve phonatory flexibility, and increase laryngeal control without laryngeal hyperfunction with 80% accuracy given minimal cues                        Time Frame: 3  Duration: months    LTG #1: At the completion of Tx, Pt will demonstrate ability to perform voice therapy techniques independently to maintain optimal laryngeal function (+/-)                                       Time Frame: 3  Duration: months       SUBJECTIVE:  Voice is strained and weak  Pain?: No      Precautions: Falls      Education Provided: Patient, Family    Response to Education: Understanding verbalized          Session Duration : 30    Today's Charges (noted here with $$):  SLP Treatments  $$ Treatment S/L/V - individual [mins]: 30                   I attest that I have reviewed the above information.  Signed: Genene Churn, SLP  03/01/2019 4:17 PM     I spent 30 minutes on the real-time audio and video with the patient. I spent an additional 5 minutes on pre- and post-visit activities.     The patient was physically located in [place] or a state in which I am permitted to provide care. The patient and/or parent/guardian understood that s/he may incur co-pays and cost sharing, and agreed to the telemedicine visit. The visit was reasonable and appropriate under the circumstances given the patient's presentation at the time. The patient and/or parent/guardian has been advised of the potential risks and limitations of this mode of treatment (including, but not limited to, the absence of in-person examination) and has agreed to be treated using telemedicine. The patient's/patient's family's questions regarding telemedicine have been answered.     If the visit was completed in an ambulatory setting, the patient and/or parent/guardian has also been advised to contact their provider???s office for worsening conditions, and seek emergency medical treatment and/or call 911 if the patient deems either necessary.

## 2019-03-06 NOTE — Unmapped (Signed)
I have reviewed the battery, threshold, lead impedance, and overall device/lead status, diagnostic data including arrhythmic events and therapies on the device evaluation on Jeffrey Ward, [phone].       [name], [place]

## 2019-03-15 DIAGNOSIS — N183 Chronic kidney disease, stage 3 (moderate): Principal | ICD-10-CM

## 2019-03-15 DIAGNOSIS — I10 Essential (primary) hypertension: Principal | ICD-10-CM

## 2019-03-15 MED ORDER — MG-PLUS-PROTEIN 133 MG TABLET
ORAL_TABLET | Freq: Two times a day (BID) | ORAL | PRN refills | 25 days | Status: CP
Start: 2019-03-15 — End: ?
  Filled 2019-03-25: qty 100, 25d supply, fill #0

## 2019-03-18 MED ORDER — PEN NEEDLE, DIABETIC 32 GAUGE X 5/32" (4 MM)
1 refills | 0 days | Status: CP
Start: 2019-03-18 — End: 2020-03-17

## 2019-03-19 NOTE — Unmapped (Signed)
Patient experienced great difficulty connecting for telehealth appointment.  Rescheduled for the new year.    Genene Churn, SLP

## 2019-03-20 NOTE — Unmapped (Signed)
[facility] Refill Coordination Note    Specialty Medication(s) to be Shipped:   Transplant: mycophenolate mofetil 250mg , cyclosporine 25mg  and cyclosporine 100mg     Other medication(s) to be shipped:   Sodium Bicarb  Mirtazapine  Sildenafil  Lyrica  Pen Needles  Mag 133mg   Alendronatre       Jeffrey Ward, DOB: 11-Feb-1951  Phone: [phone] (home) [phone] (work)      All above HIPAA information was verified with patient.     Was a Nurse, learning disability used for this call? No    Completed refill call assessment today to schedule patient's medication shipment from the [facility] [phone]).       Specialty medication(s) and dose(s) confirmed: Regimen is correct and unchanged.   Changes to medications: Randi reports no changes at this time.  Changes to insurance: No  Questions for the pharmacist: No    Confirmed patient received Welcome Packet with first shipment. The patient will receive a drug information handout for each medication shipped and additional FDA Medication Guides as required.       DISEASE/MEDICATION-SPECIFIC INFORMATION        N/A    SPECIALTY MEDICATION ADHERENCE     Medication Adherence    Patient reported X missed doses in the last month: 0  Support network for adherence: family member        Cyclosporine 100mg  : 12 days of medicine on hand   Cyclosporine 25mg  : 12 days of medicine on hand   Mycophenolate 250mg  : 12 days of medicine on hand           SHIPPING     Shipping address confirmed in Epic.     Delivery Scheduled: Yes, Expected medication delivery date: 03/26/19.     Medication will be delivered via UPS to the prescription address in Epic [facility].    Jeffrey Ward   [facility] Specialty Technician

## 2019-03-25 MED FILL — MYCOPHENOLATE MOFETIL 250 MG CAPSULE: 30 days supply | Qty: 120 | Fill #3 | Status: AC

## 2019-03-25 MED FILL — SODIUM BICARBONATE 650 MG TABLET: 50 days supply | Qty: 100 | Fill #0 | Status: AC

## 2019-03-25 MED FILL — MIRTAZAPINE 15 MG TABLET: ORAL | 90 days supply | Qty: 90 | Fill #2

## 2019-03-25 MED FILL — PREGABALIN 75 MG CAPSULE: 30 days supply | Qty: 60 | Fill #4 | Status: AC

## 2019-03-25 MED FILL — MIRTAZAPINE 15 MG TABLET: 90 days supply | Qty: 90 | Fill #2 | Status: AC

## 2019-03-25 MED FILL — MYCOPHENOLATE MOFETIL 250 MG CAPSULE: ORAL | 30 days supply | Qty: 120 | Fill #3

## 2019-03-25 MED FILL — SURE COMFORT PEN NEEDLE 32 GAUGE X 5/32" (4 MM): 25 days supply | Qty: 100 | Fill #0 | Status: AC

## 2019-03-25 MED FILL — MG-PLUS-PROTEIN 133 MG TABLET: 25 days supply | Qty: 100 | Fill #0 | Status: AC

## 2019-03-25 MED FILL — SURE COMFORT PEN NEEDLE 32 GAUGE X 5/32" (4 MM): 25 days supply | Qty: 100 | Fill #0

## 2019-03-25 MED FILL — CYCLOSPORINE MODIFIED 25 MG CAPSULE: 30 days supply | Qty: 90 | Fill #9 | Status: AC

## 2019-03-25 MED FILL — CYCLOSPORINE MODIFIED 100 MG CAPSULE: 30 days supply | Qty: 90 | Fill #9 | Status: AC

## 2019-03-25 MED FILL — CYCLOSPORINE MODIFIED 100 MG CAPSULE: 30 days supply | Qty: 90 | Fill #9

## 2019-03-25 MED FILL — ALENDRONATE 70 MG TABLET: ORAL | 28 days supply | Qty: 4 | Fill #2

## 2019-03-25 MED FILL — SODIUM BICARBONATE 650 MG TABLET: ORAL | 50 days supply | Qty: 100 | Fill #0

## 2019-03-25 MED FILL — SILDENAFIL (PULMONARY HYPERTENSION) 20 MG TABLET: 30 days supply | Qty: 180 | Fill #2

## 2019-03-25 MED FILL — ALENDRONATE 70 MG TABLET: 28 days supply | Qty: 4 | Fill #2 | Status: AC

## 2019-03-25 MED FILL — PREGABALIN 75 MG CAPSULE: 30 days supply | Qty: 60 | Fill #4

## 2019-03-25 MED FILL — SILDENAFIL (PULMONARY HYPERTENSION) 20 MG TABLET: 30 days supply | Qty: 180 | Fill #2 | Status: AC

## 2019-03-25 MED FILL — CYCLOSPORINE MODIFIED 25 MG CAPSULE: 30 days supply | Qty: 90 | Fill #9

## 2019-04-17 NOTE — Unmapped (Signed)
[facility] Refill Coordination Note    Specialty Medication(s) to be Shipped:   Transplant: mycophenolate mofetil 250mg , cyclosporine 25mg  and cyclosporine 100mg     Other medication(s) to be shipped: alendronate 70mg , magnesium 133mg , pregablin 75mg , sildenafil 20mg , chlorthalidone 25mg      Jeffrey Ward, DOB: 11-11-1950  Phone: [phone] (home) [phone] (work)      All above HIPAA information was verified with patient.     Was a Nurse, learning disability used for this call? No    Completed refill call assessment today to schedule patient's medication shipment from the [facility] [phone]).       Specialty medication(s) and dose(s) confirmed: Regimen is correct and unchanged.   Changes to medications: Jentry reports no changes at this time.  Changes to insurance: No  Questions for the pharmacist: No    Confirmed patient received Welcome Packet with first shipment. The patient will receive a drug information handout for each medication shipped and additional FDA Medication Guides as required.       DISEASE/MEDICATION-SPECIFIC INFORMATION        N/A    SPECIALTY MEDICATION ADHERENCE     Medication Adherence    Patient reported X missed doses in the last month: 0  Specialty Medication: Cyclosporine 25mg   Patient is on additional specialty medications: Yes  Additional Specialty Medications: Cyclosporine 100mg   Patient Reported Additional Medication X Missed Doses in the Last Month: 0  Patient is on more than two specialty medications: Yes  Specialty Medication: Mycophenolate 250mg   Patient Reported Additional Medication X Missed Doses in the Last Month: 0  Informant: patient  Support network for adherence: family member                Cyclosporine 25 mg: 7 days of medicine on hand   Cyclosporine 100 mg: 7 days of medicine on hand   Mycophenolate 250 mg: 7 days of medicine on hand          SHIPPING     Shipping address confirmed in Epic.     Delivery Scheduled: Yes, Expected medication delivery date: 04/23/19.     Medication will be delivered via UPS to the prescription address in Epic W[facility].    Jasper Loser   [facility] Specialty Technician

## 2019-04-22 MED FILL — MG-PLUS-PROTEIN 133 MG TABLET: 25 days supply | Qty: 100 | Fill #1 | Status: AC

## 2019-04-22 MED FILL — MG-PLUS-PROTEIN 133 MG TABLET: ORAL | 25 days supply | Qty: 100 | Fill #1

## 2019-04-22 MED FILL — PREGABALIN 75 MG CAPSULE: 30 days supply | Qty: 60 | Fill #5

## 2019-04-22 MED FILL — CYCLOSPORINE MODIFIED 25 MG CAPSULE: 30 days supply | Qty: 90 | Fill #10 | Status: AC

## 2019-04-22 MED FILL — PREGABALIN 75 MG CAPSULE: 30 days supply | Qty: 60 | Fill #5 | Status: AC

## 2019-04-22 MED FILL — CYCLOSPORINE MODIFIED 25 MG CAPSULE: 30 days supply | Qty: 90 | Fill #10

## 2019-04-22 MED FILL — CYCLOSPORINE MODIFIED 100 MG CAPSULE: 30 days supply | Qty: 90 | Fill #10

## 2019-04-22 MED FILL — SILDENAFIL (PULMONARY HYPERTENSION) 20 MG TABLET: 30 days supply | Qty: 180 | Fill #3

## 2019-04-22 MED FILL — CHLORTHALIDONE 25 MG TABLET: 60 days supply | Qty: 30 | Fill #0 | Status: AC

## 2019-04-22 MED FILL — CYCLOSPORINE MODIFIED 100 MG CAPSULE: 30 days supply | Qty: 90 | Fill #10 | Status: AC

## 2019-04-22 MED FILL — MYCOPHENOLATE MOFETIL 250 MG CAPSULE: ORAL | 30 days supply | Qty: 120 | Fill #4

## 2019-04-22 MED FILL — MYCOPHENOLATE MOFETIL 250 MG CAPSULE: 30 days supply | Qty: 120 | Fill #4 | Status: AC

## 2019-04-22 MED FILL — ALENDRONATE 70 MG TABLET: ORAL | 28 days supply | Qty: 4 | Fill #3

## 2019-04-22 MED FILL — ALENDRONATE 70 MG TABLET: 28 days supply | Qty: 4 | Fill #3 | Status: AC

## 2019-04-22 MED FILL — SILDENAFIL (PULMONARY HYPERTENSION) 20 MG TABLET: 30 days supply | Qty: 180 | Fill #3 | Status: AC

## 2019-04-22 MED FILL — CHLORTHALIDONE 25 MG TABLET: ORAL | 60 days supply | Qty: 30 | Fill #0

## 2019-05-15 MED ORDER — PREGABALIN 75 MG CAPSULE
ORAL_CAPSULE | Freq: Two times a day (BID) | ORAL | 6 refills | 30.00000 days
Start: 2019-05-15 — End: ?

## 2019-05-16 MED FILL — SODIUM BICARBONATE 650 MG TABLET: ORAL | 50 days supply | Qty: 100 | Fill #1

## 2019-05-16 MED FILL — SODIUM BICARBONATE 650 MG TABLET: 50 days supply | Qty: 100 | Fill #1 | Status: AC

## 2019-05-22 NOTE — Unmapped (Signed)
[facility] Refill Coordination Note    Specialty Medication(s) to be Shipped:   Transplant: mycophenolate mofetil 250mg , cyclosporine 25mg  and cyclosporine 100mg     Other medication(s) to be shipped: alendronate 70mg , magnesium, pregabalin 75mg  and sildenafil 20mg      Jeffrey Ward, DOB: February 18, 1951  Phone: [phone] (home) [phone] (work)      All above HIPAA information was verified with patient.     Was a Nurse, learning disability used for this call? No    Completed refill call assessment today to schedule patient's medication shipment from the [facility] [phone]).       Specialty medication(s) and dose(s) confirmed: Regimen is correct and unchanged.   Changes to medications: Jeffrey Ward reports no changes at this time.  Changes to insurance: No  Questions for the pharmacist: No    Confirmed patient received Welcome Packet with first shipment. The patient will receive a drug information handout for each medication shipped and additional FDA Medication Guides as required.       DISEASE/MEDICATION-SPECIFIC INFORMATION        N/A    SPECIALTY MEDICATION ADHERENCE     Medication Adherence    Patient reported X missed doses in the last month: 0  Specialty Medication: Cyclosporine 25mg   Patient is on additional specialty medications: Yes  Additional Specialty Medications: Cyclosporine 100mg   Patient Reported Additional Medication X Missed Doses in the Last Month: 0  Patient is on more than two specialty medications: Yes  Specialty Medication: Mycophenolate 250mg   Patient Reported Additional Medication X Missed Doses in the Last Month: 0  Support network for adherence: family member          Cyclosporine 25 mg: 7 days of medicine on hand   Cyclosporine 100 mg: 7 days of medicine on hand   Mycophenolate 250 mg: 7 days of medicine on hand     SHIPPING     Shipping address confirmed in Epic.     Delivery Scheduled: Yes, Expected medication delivery date: 05/24/2019.     Medication will be delivered via UPS to the prescription address in Epic W[facility].    Lorelei Pont [name] Specialty Technician

## 2019-05-23 MED FILL — CYCLOSPORINE MODIFIED 100 MG CAPSULE: 30 days supply | Qty: 90 | Fill #11

## 2019-05-23 MED FILL — CYCLOSPORINE MODIFIED 25 MG CAPSULE: 30 days supply | Qty: 90 | Fill #11

## 2019-05-23 MED FILL — CYCLOSPORINE MODIFIED 100 MG CAPSULE: 30 days supply | Qty: 90 | Fill #11 | Status: AC

## 2019-05-23 MED FILL — ALENDRONATE 70 MG TABLET: ORAL | 28 days supply | Qty: 4 | Fill #4

## 2019-05-23 MED FILL — MG-PLUS-PROTEIN 133 MG TABLET: ORAL | 25 days supply | Qty: 100 | Fill #2

## 2019-05-23 MED FILL — PREGABALIN 75 MG CAPSULE: ORAL | 30 days supply | Qty: 60 | Fill #0

## 2019-05-23 MED FILL — CYCLOSPORINE MODIFIED 25 MG CAPSULE: 30 days supply | Qty: 90 | Fill #11 | Status: AC

## 2019-05-23 MED FILL — SILDENAFIL (PULMONARY HYPERTENSION) 20 MG TABLET: 30 days supply | Qty: 180 | Fill #4 | Status: AC

## 2019-05-23 MED FILL — MG-PLUS-PROTEIN 133 MG TABLET: 25 days supply | Qty: 100 | Fill #2 | Status: AC

## 2019-05-23 MED FILL — MYCOPHENOLATE MOFETIL 250 MG CAPSULE: ORAL | 30 days supply | Qty: 120 | Fill #5

## 2019-05-23 MED FILL — PREGABALIN 75 MG CAPSULE: 30 days supply | Qty: 60 | Fill #0 | Status: AC

## 2019-05-23 MED FILL — ALENDRONATE 70 MG TABLET: 28 days supply | Qty: 4 | Fill #4 | Status: AC

## 2019-05-23 MED FILL — MYCOPHENOLATE MOFETIL 250 MG CAPSULE: 30 days supply | Qty: 120 | Fill #5 | Status: AC

## 2019-05-23 MED FILL — SILDENAFIL (PULMONARY HYPERTENSION) 20 MG TABLET: 30 days supply | Qty: 180 | Fill #4

## 2019-05-23 NOTE — Unmapped (Addendum)
Spoke with [name] about the mfg change on cyclosporine from teva to apotex.  Told him to call the clinica and schedule labs when he starts the new mfg.  Pt acknowledged understanding.         [facility] Clinical Assessment & Refill Coordination Note    [name], DOB: 09-13-1950  Phone: [phone] (home) [phone] (work)    All above HIPAA information was verified with patient.     Was a Nurse, learning disability used for this call? No    Specialty Medication(s):   Transplant: mycophenolate mofetil 250mg , cyclosporine 25mg  and cyclosporine 100mg      Current Outpatient Medications   Medication Sig Dispense Refill   ??? alendronate (FOSAMAX) 70 MG tablet Take 1 tablet (70 mg total) by mouth every seven (7) days. 4 tablet 11   ??? bisacodyl (DULCOLAX, BISACODYL,) 5 mg EC tablet Take 5 mg by mouth daily as needed.      ??? blood sugar diagnostic Strp Use three (3) times a day before meals. 100 each 7   ??? blood-glucose meter (GLUCOSE MONITORING KIT) kit Use as instructed 1 each 0   ??? chlorthalidone (HYGROTON) 25 MG tablet Take 1/2 tablet (12.5 mg total) by mouth every morning. 30 tablet 2   ??? cholecalciferol, vitamin D3, 125 mcg (5,000 unit) tablet Take 125 mcg by mouth daily.     ??? cycloSPORINE modified (NEORAL) 100 MG capsule Take 2 capsules (200 mg) by mouth in the morning and 1 capsule in the evening with three 25mg  capsules (For a total daily dose of 200mg  each morning and 175mg  every evening) 90 each 11   ??? cycloSPORINE modified (NEORAL) 25 MG capsule Take 3 capsules (75mg ) by mouth every evening with a 100mg  capsule every evening (For a total dose of 175mg  every evening) 90 each 11   ??? glimepiride (AMARYL) 4 MG tablet Take 4 mg by mouth daily.      ??? insulin glargine (BASAGLAR KWIKPEN U-100 INSULIN) 100 unit/mL (3 mL) injection pen Inject 0.12 mL (12 Units total) under the skin nightly. 15 mL 12   ??? insulin lispro (HUMALOG KWIKPEN INSULIN) 100 unit/mL injection pen Inject 4 Units under the skin Three (3) times a day before meals. 15 mL 12   ??? magnesium oxide-Mg AA chelate (MAGNESIUM, AMINO ACID CHELATE,) 133 mg Tab Take 2 tablets by mouth Two (2) times a day. 100 tablet PRN   ??? melatonin 3 mg Tab Take 1 tablet (3 mg total) by mouth nightly. 30 tablet 0   ??? mirtazapine (REMERON) 15 MG tablet TAKE 1 TABLET BY MOUTH NIGHTLY 90 tablet 3   ??? mycophenolate (CELLCEPT) 250 mg capsule Take 2 capsules (500 mg total) by mouth two (2) times a day. 120 capsule 7   ??? pen needle, diabetic (ULTICARE PEN NEEDLE) 32 gauge x 5/32 (4 mm) Ndle USE WITH INSULIN PEN 4 TIMES DAILY AS DIRECTED 100 each 1   ??? predniSONE (DELTASONE) 5 MG tablet Take 4 tablets (20 mg) by mouth for one day, then take 3 tablets (15 mg) the next day, then take 2 tablets (10 mg) the next day, then take 1 tablet (5mg ) then stop 10 tablet 0   ??? pregabalin (LYRICA) 75 MG capsule Take 1 capsule (75 mg total) by mouth Two (2) times a day. 60 capsule 6   ??? sildenafiL, pulm.hypertension, (REVATIO) 20 mg tablet Take 2 tablets (40mg ) by G-tube 3 times daily. 180 tablet 6   ??? sodium bicarbonate 650  mg tablet Take 1 tablet (650 mg total) by mouth two (2) times a day. 100 tablet 6     No current facility-administered medications for this visit.         Changes to medications: Jeffrey Ward reports no changes at this time.    No Known Allergies    Changes to allergies: No    SPECIALTY MEDICATION ADHERENCE     Mycophenolate 250 mg: 7 days of medicine on hand   Cyclosporine 100 mg: 7 days of medicine on hand   Cyclosporine 25 mg: 7 days of medicine on hand     Medication Adherence    Patient reported X missed doses in the last month: 0  Specialty Medication: Cyclosporine 25mg   Patient is on additional specialty medications: Yes  Additional Specialty Medications: Cyclosporine 100mg   Patient Reported Additional Medication X Missed Doses in the Last Month: 0  Patient is on more than two specialty medications: Yes  Specialty Medication: Mycophenolate 250mg   Patient Reported Additional Medication X Missed Doses in the Last Month: 0  Support network for adherence: family member          Specialty medication(s) dose(s) confirmed: Regimen is correct and unchanged.     Are there any concerns with adherence? No    Adherence counseling provided? Not needed    CLINICAL MANAGEMENT AND INTERVENTION      Clinical Benefit Assessment:    Do you feel the medicine is effective or helping your condition? Yes    Clinical Benefit counseling provided? Not needed    Adverse Effects Assessment:    Are you experiencing any side effects? No    Are you experiencing difficulty administering your medicine? No    Quality of Life Assessment:    How many days over the past month did your liver transplant  keep you from your normal activities? For example, brushing your teeth or getting up in the morning. 0    Have you discussed this with your provider? Not needed    Therapy Appropriateness:    Is therapy appropriate? Yes, therapy is appropriate and should be continued    DISEASE/MEDICATION-SPECIFIC INFORMATION      N/A    PATIENT SPECIFIC NEEDS     ? Does the patient have any physical, cognitive, or cultural barriers? No    ? Is the patient high risk? Yes, patient is taking a REMS drug. Medication is dispensed in compliance with REMS program.     ? Does the patient require a Care Management Plan? No     ? Does the patient require physician intervention or other additional services (i.e. nutrition, smoking cessation, social work)? No      SHIPPING     Specialty Medication(s) to be Shipped:   Transplant: mycophenolate mofetil 250mg , cyclosporine 25mg  and cyclosporine 100mg     Other medication(s) to be shipped: Alendronate, MG, Pregabalin, Sidenafil     Changes to insurance: No    Delivery Scheduled: Yes, Expected medication delivery date: 05/24/19.     Medication will be delivered via UPS to the confirmed prescription address in [facility].    The patient will receive a drug information handout for each medication shipped and additional FDA Medication Guides as required.  Verified that patient has previously received a Conservation officer, historic buildings.    All of the patient's questions and concerns have been addressed.    Tera Helper   [facility] Specialty Pharmacist

## 2019-05-27 ENCOUNTER — Institutional Professional Consult (permissible substitution): Admit: 2019-05-27 | Discharge: 2019-05-28 | Payer: MEDICARE

## 2019-05-27 NOTE — Unmapped (Signed)
Left VM at [phone]   advising Pt to send a manual transmission. Instructed to call back to EP Remote Device Monitoring if need further assistance on how to do this.

## 2019-05-29 NOTE — Unmapped (Signed)
Cardiac Implantable Electronic Device Remote Monitoring     Visit Date:  05/27/2019    1. Encounter for interrogation of cardiac pacemaker  10/11/16    2. Bradycardia    Findings:   Normal device function  Adequate battery reserve->8 years  Normal electrode impedance.  Normal capture threshold and appropriate programmed output.  Normal measured R wave and appropriate programmed sensitivity.  Rate histograms reviewed.  Counters reviewed.  Pacing therapies reviewed.      Arrhythmias: No Events    Plan:   1. Continue Routine Remote Monitoring   2. Patient is due for in-person.      __________________________________________________________________    Manufacturer of Device: Medtronic       Type of Device: Micra Leadless  See scanned/downloaded PDF report for model numbers, serial numbers, and date(s) of implant.    Presenting Rhythm: VS   VS 89.8%  VP 10.2%    ______________________________________________________________________    Please see downloaded PDF file of transmission under Media Tab  for full details of device interrogation to include, when applicable,  battery status/charge time, lead trend data, and programmed parameters.

## 2019-06-12 DIAGNOSIS — Z944 Liver transplant status: Principal | ICD-10-CM

## 2019-06-12 DIAGNOSIS — D899 Disorder involving the immune mechanism, unspecified: Principal | ICD-10-CM

## 2019-06-12 MED ORDER — CYCLOSPORINE MODIFIED 100 MG CAPSULE
ORAL_CAPSULE | ORAL | 3 refills | 90 days | Status: CP
Start: 2019-06-12 — End: 2020-06-11
  Filled 2019-06-19: qty 90, 30d supply, fill #0

## 2019-06-12 MED ORDER — CYCLOSPORINE MODIFIED 25 MG CAPSULE
ORAL_CAPSULE | Freq: Every evening | ORAL | 3 refills | 90 days | Status: CP
Start: 2019-06-12 — End: 2020-06-11

## 2019-06-12 NOTE — Unmapped (Signed)
Pt request for RX Refill

## 2019-06-12 NOTE — Unmapped (Signed)
Reminded patient to get labs drawn for change in manf of Cyclosporine.  He says he will go on Friday so MD can see up do date levels before approving refill.      [facility] Clinical Assessment & Refill Coordination Note    Jeffrey Ward, DOB: 1950-07-27  Phone: [phone] (home) [phone] (work)    All above HIPAA information was verified with patient.     Was a Nurse, learning disability used for this call? 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 alendronate (FOSAMAX) 70 MG tablet Take 1 tablet (70 mg total) by mouth every seven (7) days. 4 tablet 11   ??? bisacodyl (DULCOLAX, BISACODYL,) 5 mg EC tablet Take 5 mg by mouth daily as needed.      ??? blood sugar diagnostic Strp Use three (3) times a day before meals. 100 each 7   ??? blood-glucose meter (GLUCOSE MONITORING KIT) kit Use as instructed 1 each 0   ??? chlorthalidone (HYGROTON) 25 MG tablet Take 1/2 tablet (12.5 mg total) by mouth every morning. 30 tablet 2   ??? cholecalciferol, vitamin D3, 125 mcg (5,000 unit) tablet Take 125 mcg by mouth daily.     ??? cycloSPORINE modified (NEORAL) 100 MG capsule Take 2 capsules (200 mg) by mouth in the morning and 1 capsule in the evening with three 25mg  capsules (For a total daily dose of 200mg  each morning and 175mg  every evening) 90 each 11   ??? cycloSPORINE modified (NEORAL) 25 MG capsule Take 3 capsules (75mg ) by mouth every evening with a 100mg  capsule every evening (For a total dose of 175mg  every evening) 90 each 11   ??? glimepiride (AMARYL) 4 MG tablet Take 4 mg by mouth daily.      ??? insulin glargine (BASAGLAR KWIKPEN U-100 INSULIN) 100 unit/mL (3 mL) injection pen Inject 0.12 mL (12 Units total) under the skin nightly. 15 mL 12   ??? insulin lispro (HUMALOG KWIKPEN INSULIN) 100 unit/mL injection pen Inject 4 Units under the skin Three (3) times a day before meals. 15 mL 12   ??? magnesium oxide-Mg AA chelate (MAGNESIUM, AMINO ACID CHELATE,) 133 mg Tab Take 2 tablets by mouth Two (2) times a day. 100 tablet PRN   ??? melatonin 3 mg Tab Take 1 tablet (3 mg total) by mouth nightly. 30 tablet 0   ??? mirtazapine (REMERON) 15 MG tablet TAKE 1 TABLET BY MOUTH NIGHTLY 90 tablet 3   ??? mycophenolate (CELLCEPT) 250 mg capsule Take 2 capsules (500 mg total) by mouth two (2) times a day. 120 capsule 7   ??? pen needle, diabetic (ULTICARE PEN NEEDLE) 32 gauge x 5/32 (4 mm) Ndle USE WITH INSULIN PEN 4 TIMES DAILY AS DIRECTED 100 each 1   ??? predniSONE (DELTASONE) 5 MG tablet Take 4 tablets (20 mg) by mouth for one day, then take 3 tablets (15 mg) the next day, then take 2 tablets (10 mg) the next day, then take 1 tablet (5mg ) then stop 10 tablet 0   ??? pregabalin (LYRICA) 75 MG capsule Take 1 capsule (75 mg total) by mouth Two (2) times a day. 60 capsule 6   ??? sildenafiL, pulm.hypertension, (REVATIO) 20 mg tablet Take 2 tablets (40mg ) by G-tube 3 times daily. 180 tablet 6   ??? sodium bicarbonate 650 mg tablet Take 1 tablet (650  mg total) by mouth two (2) times a day. 100 tablet 6     No current facility-administered medications for this visit.         Changes to medications: Godfrey reports no changes at this time.    No Known Allergies    Changes to allergies: No    SPECIALTY MEDICATION ADHERENCE     Cyclosporine Mod 25 mg: 12 days of medicine on hand   Cyclosporine Mod 100 mg: 12 days of medicine on hand   Mycophenolate 250 mg: 12 days of medicine on hand        Medication Adherence    Patient reported X missed doses in the last month: 1  Specialty Medication: Cyclosporine Mod 100 mg  Patient is on additional specialty medications: Yes  Additional Specialty Medications: Cyclosporine Mod 25 mg  Patient Reported Additional Medication X Missed Doses in the Last Month: 1  Patient is on more than two specialty medications: Yes  Specialty Medication: Mycophenolate 250 mg  Patient Reported Additional Medication X Missed Doses in the Last Month: 1  Informant: patient  Support network for adherence: family member  Confirmed plan for next specialty medication refill: delivery by pharmacy  Refills needed for supportive medications: yes, ordered or provider notified          Specialty medication(s) dose(s) confirmed: Regimen is correct and unchanged.     Are there any concerns with adherence? No    Adherence counseling provided? Not needed    CLINICAL MANAGEMENT AND INTERVENTION      Clinical Benefit Assessment:    Do you feel the medicine is effective or helping your condition? Yes    Clinical Benefit counseling provided? Labs from 02/19/20 show evidence of clinical benefit    Adverse Effects Assessment:    Are you experiencing any side effects? No    Are you experiencing difficulty administering your medicine? No    Quality of Life Assessment:    How many days over the past month did your transplant  keep you from your normal activities? For example, brushing your teeth or getting up in the morning. 0    Have you discussed this with your provider? Not needed    Therapy Appropriateness:    Is therapy appropriate? Yes, therapy is appropriate and should be continued    DISEASE/MEDICATION-SPECIFIC INFORMATION      N/A    PATIENT SPECIFIC NEEDS     ? Does the patient have any physical, cognitive, or cultural barriers? No    ? Is the patient high risk? Yes, patient is taking a REMS drug. Medication is dispensed in compliance with REMS program.     ? Does the patient require a Care Management Plan? No     ? Does the patient require physician intervention or other additional services (i.e. nutrition, smoking cessation, social work)? No      SHIPPING     Specialty Medication(s) to be Shipped:   Transplant: mycophenolate mofetil 250mg , cyclosporine 25mg  and cyclosporine 100mg     Other medication(s) to be shipped: Pregabalin 75 mg     Changes to insurance: No    Delivery Scheduled: Yes, Expected medication delivery date: 06/20/19.  However, Rx request for refills was sent to the provider as there are none remaining.     Medication will be delivered via Next Day Courier to the confirmed prescription address in [facility].    The patient will receive a drug information handout for each medication shipped and additional FDA Medication Guides  as required.  Verified that patient has previously received a Conservation officer, historic buildings.    All of the patient's questions and concerns have been addressed.    Tymothy Cass Vangie Bicker   [facility] Specialty Pharmacist

## 2019-06-14 ENCOUNTER — Other Ambulatory Visit: Admit: 2019-06-14 | Discharge: 2019-06-15 | Payer: MEDICARE

## 2019-06-14 LAB — RENAL FUNCTION PANEL
ALBUMIN: 4.2 g/dL (ref 3.5–5.0)
BLOOD UREA NITROGEN: 52 mg/dL — ABNORMAL HIGH (ref 7–21)
BUN / CREAT RATIO: 22
CHLORIDE: 106 mmol/L (ref 98–107)
CO2: 23 mmol/L (ref 22.0–30.0)
CREATININE: 2.33 mg/dL — ABNORMAL HIGH (ref 0.70–1.30)
EGFR CKD-EPI NON-AA MALE: 27 mL/min/{1.73_m2} — ABNORMAL LOW (ref >=60)
GLUCOSE RANDOM: 107 mg/dL (ref 70–179)
PHOSPHORUS: 3.1 mg/dL (ref 2.9–4.7)
POTASSIUM: 5.5 mmol/L — ABNORMAL HIGH (ref 3.5–5.0)
SODIUM: 140 mmol/L (ref 135–145)

## 2019-06-14 LAB — EGFR CKD-EPI AA MALE
Glomerular filtration rate/1.73 sq M.predicted.black:ArVRat:Pt:Ser/Plas/Bld:Qn:Creatinine-based formula (CKD-EPI): 31 — ABNORMAL LOW

## 2019-06-14 LAB — CALCIUM: Calcium:MCnc:Pt:Ser/Plas:Qn:: 9.4

## 2019-06-17 LAB — VITAMIN D, TOTAL (25OH): Lab: 38.5

## 2019-06-18 DIAGNOSIS — K769 Liver disease, unspecified: Principal | ICD-10-CM

## 2019-06-18 DIAGNOSIS — Z944 Liver transplant status: Principal | ICD-10-CM

## 2019-06-18 DIAGNOSIS — D899 Disorder involving the immune mechanism, unspecified: Principal | ICD-10-CM

## 2019-06-18 DIAGNOSIS — E875 Hyperkalemia: Principal | ICD-10-CM

## 2019-06-18 DIAGNOSIS — Z79899 Other long term (current) drug therapy: Principal | ICD-10-CM

## 2019-06-18 MED ORDER — PREGABALIN 100 MG CAPSULE
ORAL_CAPSULE | 11 refills | 0 days
Start: 2019-06-18 — End: ?

## 2019-06-18 MED ORDER — ATORVASTATIN 20 MG TABLET
ORAL_TABLET | 6 refills | 0 days
Start: 2019-06-18 — End: 2019-06-19

## 2019-06-18 MED ORDER — ROSUVASTATIN 10 MG TABLET
ORAL_TABLET | 6 refills | 0 days
Start: 2019-06-18 — End: 2019-06-19

## 2019-06-18 NOTE — Unmapped (Signed)
Left VM for patient requesting he repeat labs due to recent change in drug manf.

## 2019-06-19 MED ORDER — ROSUVASTATIN 5 MG TABLET
ORAL_TABLET | 11 refills | 0 days
Start: 2019-06-19 — End: ?

## 2019-06-19 MED FILL — CYCLOSPORINE MODIFIED 25 MG CAPSULE: 30 days supply | Qty: 90 | Fill #0 | Status: AC

## 2019-06-19 MED FILL — CYCLOSPORINE MODIFIED 100 MG CAPSULE: ORAL | 30 days supply | Qty: 90 | Fill #0

## 2019-06-19 MED FILL — CYCLOSPORINE MODIFIED 100 MG CAPSULE: 30 days supply | Qty: 90 | Fill #0 | Status: AC

## 2019-06-19 MED FILL — MYCOPHENOLATE MOFETIL 250 MG CAPSULE: ORAL | 30 days supply | Qty: 120 | Fill #6

## 2019-06-19 MED FILL — PREGABALIN 75 MG CAPSULE: 30 days supply | Qty: 60 | Fill #1 | Status: AC

## 2019-06-19 MED FILL — PREGABALIN 75 MG CAPSULE: ORAL | 30 days supply | Qty: 60 | Fill #1

## 2019-06-19 MED FILL — MYCOPHENOLATE MOFETIL 250 MG CAPSULE: 30 days supply | Qty: 120 | Fill #6 | Status: AC

## 2019-06-21 ENCOUNTER — Ambulatory Visit: Admit: 2019-06-21 | Discharge: 2019-06-22 | Payer: MEDICARE

## 2019-06-21 LAB — BILIRUBIN TOTAL: Bilirubin:MCnc:Pt:Ser/Plas:Qn:: 0.5

## 2019-06-21 LAB — COMPREHENSIVE METABOLIC PANEL
ALBUMIN: 3.8 g/dL (ref 3.5–5.0)
ALKALINE PHOSPHATASE: 76 U/L (ref 38–126)
ALT (SGPT): 10 U/L (ref <50)
AST (SGOT): 15 U/L — ABNORMAL LOW (ref 19–55)
BILIRUBIN TOTAL: 0.5 mg/dL (ref 0.0–1.2)
BUN / CREAT RATIO: 21
CALCIUM: 10 mg/dL (ref 8.5–10.2)
CHLORIDE: 111 mmol/L — ABNORMAL HIGH (ref 98–107)
CO2: 21 mmol/L — ABNORMAL LOW (ref 22.0–30.0)
CREATININE: 2.01 mg/dL — ABNORMAL HIGH (ref 0.70–1.30)
EGFR CKD-EPI AA MALE: 37 mL/min/{1.73_m2} — ABNORMAL LOW (ref >=60)
EGFR CKD-EPI NON-AA MALE: 32 mL/min/{1.73_m2} — ABNORMAL LOW (ref >=60)
GLUCOSE RANDOM: 83 mg/dL (ref 70–179)
POTASSIUM: 5.1 mmol/L — ABNORMAL HIGH (ref 3.5–5.0)
PROTEIN TOTAL: 6.2 g/dL — ABNORMAL LOW (ref 6.5–8.3)
SODIUM: 141 mmol/L (ref 135–145)

## 2019-06-21 LAB — PHOSPHORUS
PHOSPHORUS: 3 mg/dL (ref 2.9–4.7)
Phosphate:MCnc:Pt:Ser/Plas:Qn:: 3

## 2019-06-21 LAB — CBC W/ AUTO DIFF
BASOPHILS ABSOLUTE COUNT: 0 10*9/L (ref 0.0–0.1)
EOSINOPHILS ABSOLUTE COUNT: 0.1 10*9/L (ref 0.0–0.7)
EOSINOPHILS RELATIVE PERCENT: 1.1 %
HEMATOCRIT: 36.5 % — ABNORMAL LOW (ref 38.0–50.0)
HEMOGLOBIN: 12.5 g/dL — ABNORMAL LOW (ref 13.5–17.5)
LYMPHOCYTES ABSOLUTE COUNT: 1 10*9/L (ref 0.7–4.0)
LYMPHOCYTES RELATIVE PERCENT: 19.7 %
MEAN CORPUSCULAR HEMOGLOBIN CONC: 34.2 g/dL (ref 30.0–36.0)
MEAN CORPUSCULAR HEMOGLOBIN: 30.5 pg (ref 26.0–34.0)
MEAN CORPUSCULAR VOLUME: 89.1 fL (ref 81.0–95.0)
MEAN PLATELET VOLUME: 9.7 fL (ref 7.0–10.0)
MONOCYTES ABSOLUTE COUNT: 0.4 10*9/L (ref 0.1–1.0)
MONOCYTES RELATIVE PERCENT: 7.3 %
NEUTROPHILS ABSOLUTE COUNT: 3.6 10*9/L (ref 1.7–7.7)
NEUTROPHILS RELATIVE PERCENT: 71.3 %
NUCLEATED RED BLOOD CELLS: 0 /100{WBCs} (ref <=4)
RED CELL DISTRIBUTION WIDTH: 13.5 % (ref 12.0–15.0)
WBC ADJUSTED: 5.1 10*9/L (ref 3.5–10.5)

## 2019-06-21 LAB — PLATELET COUNT: Platelets:NCnc:Pt:Bld:Qn:Automated count: 93 — ABNORMAL LOW

## 2019-06-21 LAB — BILIRUBIN DIRECT: Bilirubin.glucuronidated+Bilirubin.albumin bound:MCnc:Pt:Ser/Plas:Qn:: 0.1

## 2019-06-21 LAB — MAGNESIUM: Magnesium:MCnc:Pt:Ser/Plas:Qn:: 1.7

## 2019-06-21 LAB — GAMMA GLUTAMYL TRANSFERASE: Gamma glutamyl transferase:CCnc:Pt:Ser/Plas:Qn:: 27

## 2019-06-22 LAB — CYCLOSPORINE, TROUGH: Lab: 53 — ABNORMAL LOW

## 2019-06-24 MED FILL — ROSUVASTATIN 5 MG TABLET: 30 days supply | Qty: 30 | Fill #0

## 2019-06-24 MED FILL — PREGABALIN 100 MG CAPSULE: 30 days supply | Qty: 60 | Fill #0

## 2019-06-24 MED FILL — PREGABALIN 100 MG CAPSULE: 30 days supply | Qty: 60 | Fill #0 | Status: AC

## 2019-06-24 MED FILL — ROSUVASTATIN 5 MG TABLET: 30 days supply | Qty: 30 | Fill #0 | Status: AC

## 2019-06-26 NOTE — Unmapped (Signed)
Spoke with NP Martin-Velez and patient cyclosporine goal 50-100.

## 2019-07-03 ENCOUNTER — Telehealth: Admit: 2019-07-03 | Discharge: 2019-07-04 | Payer: MEDICARE | Attending: Internal Medicine | Primary: Internal Medicine

## 2019-07-03 DIAGNOSIS — E1121 Type 2 diabetes mellitus with diabetic nephropathy: Secondary | ICD-10-CM

## 2019-07-03 DIAGNOSIS — I10 Essential (primary) hypertension: Principal | ICD-10-CM

## 2019-07-03 DIAGNOSIS — Z794 Long term (current) use of insulin: Principal | ICD-10-CM

## 2019-07-03 DIAGNOSIS — Z944 Liver transplant status: Principal | ICD-10-CM

## 2019-07-03 DIAGNOSIS — D849 Immunodeficiency, unspecified: Principal | ICD-10-CM

## 2019-07-03 DIAGNOSIS — N1832 Stage 3b chronic kidney disease: Principal | ICD-10-CM

## 2019-07-03 NOTE — Unmapped (Signed)
Patient Education        Medicines to Avoid With Kidney Disease: Care Instructions  Your Care Instructions     Kidney disease means that your kidneys are not able to get rid of waste from the blood. So they can't keep your body's fluids and chemicals in balance. Usually, the kidneys get rid of waste from the blood through the urine. And they balance the fluids in the body.  When your kidneys don't work as they should, you have to be careful about some medicines. They may harm your kidneys. Your doctor may tell you not to take them. Or he or she may change the dose.  Medicines for pain and swelling, such as ibuprofen (Advil or Motrin) or naproxen (Aleve), can cause harm. So can some antibiotics and antacids. And you need to be careful about some drugs that treat cancer, lower blood pressure, or get rid of water from the body. Some herbal products could cause harm too.  Follow-up care is a key part of your treatment and safety. Be sure to make and go to all appointments, and call your doctor if you are having problems. It's also a good idea to know your test results and keep a list of the medicines you take.  How can you care for yourself at home?  ?? Tell your doctor all the prescription, herbal, or over-the-counter medicines you take. Do not take any new ones unless you talk to your doctor first.  ?? Do not take anti-inflammatory medicines. These include ibuprofen (Advil, Motrin) and naproxen (Aleve). You can use acetaminophen (Tylenol) for pain.  ?? Do not take two or more pain medicines at the same time unless the doctor told you to. Many pain medicines have acetaminophen, which is Tylenol. Too much acetaminophen (Tylenol) can be harmful.  ?? Tell all doctors and others who work with your health care that you have kidney disease.  ?? Wear medical alert jewelry that lists your health problem. You can buy this at most drugstores.  Where can you learn more?  Go to [facility] at [email or web address]  Select Patient Education under [employer]. Enter [id number] in the search box to learn more about Medicines to Avoid With Kidney Disease: Care Instructions.  Current as of: February 28, 2019??????????????????????????????Content Version: 12.8  ?? 2006-2021 [employer].   Care instructions adapted under license by [facility]. If you have questions about a medical condition or this instruction, always ask your healthcare professional. [employer], Inc[employer] disclaims any warranty or liability for your use of this information.

## 2019-07-03 NOTE — Unmapped (Signed)
Referring Provider: [name], MD     PCP: [name], MD    07/03/2019    Chief Complaint: Chronic Kidney Disease    HPI: [name] is a/an 69 y.o. 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 In review of the patient's medical record, the patient's creatinine was 1.3 in July 2018 and found to be 2.0 in August 2020. He was diagnosed with 2017 during a hospital admission, but likely had this for some time. He does have a history of retinopathy or neuropathy. He denies a family history of kidney disease and denies a personal history of frequent urinary tract infections or excessive NSAID. Does report a remote history of nephrolithiasis.    Today, he feels well and denies any recent illnesses or hospitalizations. He was experiencing swallowing issues 3 weeks ago that self resolved. Otherwise, he denies fevers, chills, headaches, chest pain, nausea, vomiting, abdominal pain, dysuria, hematuria, ulcers, syncope or seizures. His lower extremity swelling is quite mild. He follows with [facility] and his last A1c was 6.2%. Most recent weight is 191lbs, does not check his blood pressure at home.       ROS: 11 systems reviewed and negative except those noted in the history of present illness    PAST MEDICAL HISTORY:  Past Medical History:   Diagnosis Date   ??? Atrial fibrillation (CMS-HCC)    ??? Basal cell carcinoma    ??? Cancer (CMS-HCC)    ??? Cirrhosis (CMS-HCC)    ??? Depression    ??? Diabetes (CMS-HCC)    ??? Headache    ??? Hepatitis C 07/17/2012   ??? History of transfusion    ??? Liver disease    ??? Low back pain 07/17/2012   ??? Varices, esophageal (CMS-HCC)        ALLERGIES  Patient has no known allergies.    SOCIAL HISTORY  Social History     Socioeconomic History   ??? Marital status: Married Spouse name: Not on file   ??? Number of children: Not on file   ??? Years of education: Not on file   ??? Highest education level: Not on file   Occupational History   ??? Not on file   Tobacco Use   ??? Smoking status: Never Smoker   ??? Smokeless tobacco: Never Used   ??? Tobacco comment: Smoked in high school for about 5 years.    Substance and Sexual Activity   ??? Alcohol use: No   ??? Drug use: No   ??? Sexual activity: Yes     Partners: Female     Birth control/protection: None   Other Topics Concern   ??? Do you use sunscreen? Not Asked   ??? Tanning bed use? Not Asked   ??? Are you easily burned? Not Asked   ??? Excessive sun exposure? Not Asked   ??? Blistering sunburns? Not Asked   ??? Exercise No   ??? Living Situation No   Social History Narrative   ??? Not on file     Social Determinants of Health     Financial Resource Strain:    ??? Difficulty of Paying Living Expenses:    Food Insecurity:    ??? Worried About Running Out of Food in the Last Year:    ??? Ran  Out of Food in the Last Year:    Transportation Needs:    ??? Lack of Transportation (Medical):    ??? Lack of Transportation (Non-Medical):    Physical Activity:    ??? Days of Exercise per Week:    ??? Minutes of Exercise per Session:    Stress:    ??? Feeling of Stress :    Social Connections:    ??? Frequency of Communication with Friends and Family:    ??? Frequency of Social Gatherings with Friends and Family:    ??? Attends Religious Services:    ??? Database administrator or Organizations:    ??? Attends Engineer, structural:    ??? Marital Status:          FAMILY HISTORY  Family History   Problem Relation Age of Onset   ??? Hypertension Mother    ??? Cancer Mother    ??? Heart disease Mother    ??? Heart disease Father    ??? Cirrhosis Neg Hx    ??? Liver cancer Neg Hx    ??? Anemia Neg Hx    ??? Diabetes Neg Hx    ??? Kidney disease Neg Hx    ??? Obesity Neg Hx    ??? Thyroid disease Neg Hx    ??? Osteoporosis Neg Hx    ??? Coronary artery disease Neg Hx    ??? Anesthesia problems Neg Hx    ??? Basal cell carcinoma Neg Hx    ??? Squamous cell carcinoma Neg Hx         MEDICATIONS:  Current Outpatient Medications   Medication Sig Dispense Refill   ??? alendronate (FOSAMAX) 70 MG tablet Take 1 tablet (70 mg total) by mouth every seven (7) days. 4 tablet 11   ??? bisacodyl (DULCOLAX, BISACODYL,) 5 mg EC tablet Take 5 mg by mouth daily as needed.      ??? blood sugar diagnostic Strp Use three (3) times a day before meals. 100 each 7   ??? blood-glucose meter (GLUCOSE MONITORING KIT) kit Use as instructed 1 each 0   ??? chlorthalidone (HYGROTON) 25 MG tablet Take 1/2 tablet (12.5 mg total) by mouth every morning. 30 tablet 2   ??? cholecalciferol, vitamin D3, 125 mcg (5,000 unit) tablet Take 125 mcg by mouth daily.     ??? cycloSPORINE modified (NEORAL) 100 MG capsule Take 2 capsules (200 mg total) by mouth daily AND 1 capsule (100 mg total) nightly. For a total dose of 200 mg in the morning 175mg  every evening. 270 capsule 3   ??? cycloSPORINE modified (NEORAL) 25 MG capsule Take 3 capsules (75 mg total) by mouth nightly. For a total dose of 200 mg in the morning 175mg  every evening 270 capsule 3   ??? glimepiride (AMARYL) 4 MG tablet Take 4 mg by mouth daily.      ??? insulin glargine (BASAGLAR KWIKPEN U-100 INSULIN) 100 unit/mL (3 mL) injection pen Inject 0.12 mL (12 Units total) under the skin nightly. 15 mL 12   ??? insulin lispro (HUMALOG KWIKPEN INSULIN) 100 unit/mL injection pen Inject 4 Units under the skin Three (3) times a day before meals. 15 mL 12   ??? magnesium oxide-Mg AA chelate (MAGNESIUM, AMINO ACID CHELATE,) 133 mg Tab Take 2 tablets by mouth Two (2) times a day. 100 tablet PRN   ??? melatonin 3 mg Tab Take 1 tablet (3 mg total) by mouth nightly. 30 tablet 0   ??? mirtazapine (REMERON) 15 MG  tablet TAKE 1 TABLET BY MOUTH NIGHTLY 90 tablet 3   ??? mycophenolate (CELLCEPT) 250 mg capsule Take 2 capsules (500 mg total) by mouth two (2) times a day. 120 capsule 7   ??? pen needle, diabetic (ULTICARE PEN NEEDLE) 32 gauge x 5/32 (4 mm) Ndle USE WITH INSULIN PEN 4 TIMES DAILY AS DIRECTED 100 each 1   ??? predniSONE (DELTASONE) 5 MG tablet Take 4 tablets (20 mg) by mouth for one day, then take 3 tablets (15 mg) the next day, then take 2 tablets (10 mg) the next day, then take 1 tablet (5mg ) then stop 10 tablet 0   ??? pregabalin (LYRICA) 100 MG capsule Take 1 capsule (100 mg total) by mouth 2 (two) times daily 60 capsule 11   ??? pregabalin (LYRICA) 75 MG capsule Take 1 capsule (75 mg total) by mouth Two (2) times a day. 60 capsule 6   ??? rosuvastatin (CRESTOR) 5 MG tablet Take 1 tablet (5 mg total) by mouth once daily 30 tablet 11   ??? sildenafiL, pulm.hypertension, (REVATIO) 20 mg tablet Take 2 tablets (40mg ) by G-tube 3 times daily. 180 tablet 6   ??? sodium bicarbonate 650 mg tablet Take 1 tablet (650 mg total) by mouth two (2) times a day. 100 tablet 6     No current facility-administered medications for this visit.       PHYSICAL EXAM:     There were no vitals filed for this visit.  CONSTITUTIONAL: Alert, no distress  PULM: No respiratory distress    The remainder of the examination was deferred secondary to the fact that this was a phone/video visit    MEDICAL DECISION MAKING  Results for orders placed or performed in visit on 06/21/19   Comprehensive Metabolic Panel   Result Value Ref Range    Sodium 141 135 - 145 mmol/L    Potassium 5.1 (H) 3.5 - 5.0 mmol/L    Chloride 111 (H) 98 - 107 mmol/L    Anion Gap 9 7 - 15 mmol/L    CO2 21.0 (L) 22.0 - 30.0 mmol/L    BUN 42 (H) 7 - 21 mg/dL    Creatinine 1.61 (H) 0.70 - 1.30 mg/dL    BUN/Creatinine Ratio 21     EGFR CKD-EPI Non-African American, Male 32 (L) >=60 mL/min/1.43m2    EGFR CKD-EPI African American, Male 37 (L) >=60 mL/min/1.17m2    Glucose 83 70 - 179 mg/dL    Calcium 09.6 8.5 - 04.5 mg/dL    Albumin 3.8 3.5 - 5.0 g/dL    Total Protein 6.2 (L) 6.5 - 8.3 g/dL    Total Bilirubin 0.5 0.0 - 1.2 mg/dL    AST 15 (L) 19 - 55 U/L    ALT 10 <50 U/L    Alkaline Phosphatase 76 38 - 126 U/L   Bilirubin, Direct   Result Value Ref Range    Bilirubin, Direct 0.10 0.00 - 0.40 mg/dL   Phosphorus Level   Result Value Ref Range    Phosphorus 3.0 2.9 - 4.7 mg/dL   Magnesium Level   Result Value Ref Range    Magnesium 1.7 1.6 - 2.2 mg/dL   Gamma GT   Result Value Ref Range    GGT 27 12 - 109 U/L   Cyclosporine, Trough   Result Value Ref Range    Cyclosporine, Trough 53 (L) 100 - 400 ng/mL   CBC w/ Differential   Result Value Ref Range    WBC 5.1 3.5 -  10.5 10*9/L    RBC 4.10 (L) 4.32 - 5.72 10*12/L    HGB 12.5 (L) 13.5 - 17.5 g/dL    HCT 14.7 (L) 82.9 - 50.0 %    MCV 89.1 81.0 - 95.0 fL    MCH 30.5 26.0 - 34.0 pg    MCHC 34.2 30.0 - 36.0 g/dL    RDW 56.2 13.0 - 86.5 %    MPV 9.7 7.0 - 10.0 fL    Platelet 93 (L) 150 - 450 10*9/L    nRBC 0 <=4 /100 WBCs    Neutrophils % 71.3 %    Lymphocytes % 19.7 %    Monocytes % 7.3 %    Eosinophils % 1.1 %    Basophils % 0.6 %    Absolute Neutrophils 3.6 1.7 - 7.7 10*9/L    Absolute Lymphocytes 1.0 0.7 - 4.0 10*9/L    Absolute Monocytes 0.4 0.1 - 1.0 10*9/L    Absolute Eosinophils 0.1 0.0 - 0.7 10*9/L    Absolute Basophils 0.0 0.0 - 0.1 10*9/L     *Note: Due to a large number of results and/or encounters for the requested time period, some results have not been displayed. A complete set of results can be found in Results Review.        Creatinine Whole Blood, POC   Date Value Ref Range Status   09/25/2015 1.0 0.8 - 1.4 mg/dL Final   78/46/9629 0.9 0.8 - 1.4 mg/dL Final     Creatinine   Date Value Ref Range Status   06/21/2019 2.01 (H) 0.70 - 1.30 mg/dL Final   52/84/1324 4.01 (H) 0.70 - 1.30 mg/dL Final   02/72/5366 4.40 (H) 0.70 - 1.30 mg/dL Final   34/74/2595 6.38 (H) 0.70 - 1.30 mg/dL Final   75/64/3329 5.18 (H) 0.70 - 1.30 mg/dL Final   84/16/6063 0.16 0.70 - 1.30 mg/dL Final   03/22/3233 5.73 0.70 - 1.30 mg/dL Final   22/04/5425 0.62 0.70 - 1.30 mg/dL Final   37/62/8315 1.76 0.70 - 1.30 mg/dL Final   16/09/3708 6.26 0.70 - 1.30 mg/dL Final     EST.GFR (MDRD)   Date Value Ref Range Status 02/15/2012 >= 60 >=60 mL/min/1.72m2 Final   08/17/2011 >= 60 >=60 mL/min/1.52m2 Final   04/20/2011 >= 60 >=60 mL/min/1.72m2 Final   11/10/2010 >= 60 >=60 mL/min/1.66m2 Final   07/21/2010 >= 60 >=60 mL/min/1.81m2 Final      BUN   Date Value Ref Range Status   06/21/2019 42 (H) 7 - 21 mg/dL Final   94/85/4627 52 (H) 7 - 21 mg/dL Final   03/50/0938 44 (H) 7 - 21 mg/dL Final   18/29/9371 44 (H) 7 - 21 mg/dL Final   69/67/8938 37 (H) 7 - 21 mg/dL Final   12/28/5100 34 (H) mg/dL Final   58/52/7782 41 (H) mg/dL Final   42/35/3614 30 (H) mg/dL Final   43/15/4008 12 7 - 21 mg/dL Final   67/61/9509 14 7 - 21 mg/dL Final      Phosphorus   Date Value Ref Range Status   06/21/2019 3.0 2.9 - 4.7 mg/dL Final   32/67/1245 3.1 2.9 - 4.7 mg/dL Final   80/99/8338 3.1 2.9 - 4.7 mg/dL Final   25/07/3974 4.1 2.9 - 4.7 mg/dL Final   73/41/9379 2.7 (L) 2.9 - 4.7 mg/dL Final      Calcium   Date Value Ref Range Status   06/21/2019 10.0 8.5 - 10.2 mg/dL Final   02/40/9735 9.7 8.5 - 10.2  mg/dL Final   16/12/9602 9.4 8.5 - 10.2 mg/dL Final   54/11/8117 9.8 8.5 - 10.2 mg/dL Final   14/78/2956 21.3 (H) 8.5 - 10.2 mg/dL Final   08/65/7846 9.3 8.5 - 10.2 mg/dL Final   96/29/5284 9.6 8.5 - 10.2 mg/dL Final   13/24/4010 9.5 8.5 - 10.2 mg/dL Final   27/25/3664 8.6 8.5 - 10.2 MG/DL Final   40/34/7425 8.7 8.5 - 10.2 MG/DL Final      PTH   Date Value Ref Range Status   06/14/2019 95.0 (H) 12.0 - 72.0 pg/mL Final      HGB   Date Value Ref Range Status   06/21/2019 12.5 (L) 13.5 - 17.5 g/dL Final   95/63/8756 43.3 (L) 13.5 - 17.5 g/dL Final   29/51/8841 66.0 13.5 - 17.5 g/dL Final   63/03/6008 93.2 (L) 13.5 - 17.5 g/dL Final   35/57/3220 25.4 (L) 13.5 - 17.5 g/dL Final     Hemoglobin   Date Value Ref Range Status   09/26/2016 6.2 (L) 13.5 - 17.5 g/dL Final     Comment:     Point of Care Testing performed at the point of care by trained personnel per documented policies.   09/18/2016 9.7 (L) 13.5 - 17.5 g/dL Final     Comment:     Point of Care Testing performed at the point of care by trained personnel per documented policies.   09/18/2016 9.6 (L) 13.5 - 17.5 g/dL Final     Comment:     Point of Care Testing performed at the point of care by trained personnel per documented policies.   09/15/2016 9.8 (L) 13.5 - 17.5 g/dL Final     Comment:     Point of Care Testing performed at the point of care by trained personnel per documented policies.   09/15/2016 9.1 (L) 13.5 - 17.5 g/dL Final     Comment:     Point of Care Testing performed at the point of care by trained personnel per documented policies.      CO2   Date Value Ref Range Status   06/21/2019 21.0 (L) 22.0 - 30.0 mmol/L Final   06/14/2019 23.0 22.0 - 30.0 mmol/L Final   02/25/2019 26.0 22.0 - 30.0 mmol/L Final   02/19/2019 25.0 22.0 - 30.0 mmol/L Final   01/11/2019 25.0 22.0 - 30.0 mmol/L Final   10/10/2013 29 22 - 30 mmol/L Final   04/03/2013 28 22 - 30 mmol/L Final   07/25/2012 30 22 - 30 mmol/L Final   05/13/2010 30 22 - 30 MMOL/L Final         I have reviewed relevant outside healthcare records    Urine Sediment: Bland sediment, no formed cellular elements    IMAGING STUDIES:   04/2018 MR Abdomen with and without contrast:  LOWER CHEST:   The visualized portions of the lower chest are unremarkable.  ABDOMEN/PELVIS:  HEPATOBILIARY:  Patient is status-post liver transplantation. Several peripherally enhancing lesions are again seen within hepatic segments II and VIII, which are stable-to-slightly decreased in size. For reference:  - Slightly decreased size of largest hepatic segment VIII lesion (13:24) which measures 1.7 x 2.1 cm (transverse by AP). This lesion previously measured 2.3 x 2.1 cm.  No new focal hepatic lesions are identified. Gallbladder surgically absent. Mild intrahepatic biliary ductal dilatation is noted, which is improved from prior exam and thought secondary to postcholecystectomy state. No obstructing stone or mass lesion is identified.  PANCREAS: Pancreas is atrophic. Multiple  nonenhancing cystic lesions are seen scattered throughout the pancreatic head and body, similar to prior. No new focal mass. No ductal dilatation.  SPLEEN: No focal parenchymal abnormality.  ADRENAL GLANDS: Unremarkable.  KIDNEYS/URETERS: Symmetric nephrograms. No enhancing renal mass. No hydronephrosis.  BOWEL/PERITONEUM/RETROPERITONEUM: No bowel obstruction. No acute inflammatory process. No free fluid.  VASCULATURE: Abdominal aorta within normal limits for patient's age. Unremarkable IVC. The portal, splenic, and superior mesenteric veins are patent.   LYMPH NODES: Previously described enlarged porta hepatis lymph node is no longer well-visualized. No new or enlarging lymphadenopathy is identified within the abdomen.  BONES/SOFT TISSUES: Posterior spinal fixation hardware with associated artifact somewhat limits evaluation of the adjacent osseous structures and soft tissues.   Gynecomastia. No soft tissue abnormality. No worrisome osseous lesion identified.  IMPRESSION:  - Unchanged to slightly decreased size of hepatic abscesses in segments 2 and 8. No new hepatic lesions identified.  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    ASSESSMENT/PLAN:  [name] is a/an 69 y.o. 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   1. Chronic Kidney Disease Stage III: The patient's renal dysfunction is most likely secondary to diabetic nephropathy and prior fluid shifts in the post-transplant requiring CRRT to manage pulmonary hypertension/fluid overload. His creatinine has ranged from 1.6-2 over the past year, most recently 2.01 from 06/21/19. His creatinine had an uptick to 2.3 from 06/14/19, this may have been due to mild dehydration while he was having swallowing issues. No recent changes in medications. Potassium and bicarbonate acceptable, continue sodium bicarbonate supplementation at 650mg  BID. Historically, he has not had any significant proteinuria or hematuria. MRI abdomen from October 2020 revealed symmetric nephrograms, no mass or hydronephrosis noted. The patient was counseled on avoiding nephrotoxic agents including but not limited to NSAIDs and contrasted studies.    2. Type 2 Diabetes: Most recent A1c 6.2%, continues to follow with [facility].    3. S/p Liver Transplant: Continues on cyclosporine, cellcept, and prednisone. Reports adherence to his immunosuppressive regimen. Last cyclosporine level 53.   4. Hypertension: Have asked patient to check his home blood pressures at least 1x/week and to report these values to me if they are consistently >140 systolic. Had added low low dose Chlorthalidone 12.5mg  daily at his last visit. Has mild edema currently, can adjust diuretic if needed going forward based on volume status.     [name] will follow up in 6 months         I spent 20 minutes on the real-time audio and video with the patient on the date of service. I spent an additional 12 minutes on pre- and post-visit activities on the date of service.     The patient was physically located in [place] or a state in which I am permitted to provide care. The patient and/or parent/guardian understood that s/he may incur co-pays and cost sharing, and agreed to the telemedicine visit. The visit was reasonable and appropriate under the circumstances given the patient's presentation at the time.    The patient and/or parent/guardian has been advised of the potential risks and limitations of this mode of treatment (including, but not limited to, the absence of in-person examination) and has agreed to be treated using telemedicine. The patient's/patient's family's questions regarding telemedicine have been answered.     If the visit was completed in an ambulatory  setting, the patient and/or parent/guardian has also been advised to contact their provider???s office for worsening conditions, and seek emergency medical treatment and/or call 911 if the patient deems either necessary.

## 2019-07-08 NOTE — Unmapped (Signed)
[facility] Clinical Assessment & Refill Coordination Note    Jeffrey Ward, DOB: [birth year]  Phone: [phone] (home) [phone] (work)    All above HIPAA information was verified with patient.     Was a Nurse, learning disability used for this call? 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 alendronate (FOSAMAX) 70 MG tablet Take 1 tablet (70 mg total) by mouth every seven (7) days. 4 tablet 11   ??? bisacodyl (DULCOLAX, BISACODYL,) 5 mg EC tablet Take 5 mg by mouth daily as needed.      ??? blood sugar diagnostic Strp Use three (3) times a day before meals. 100 each 7   ??? blood-glucose meter (GLUCOSE MONITORING KIT) kit Use as instructed 1 each 0   ??? chlorthalidone (HYGROTON) 25 MG tablet Take 1/2 tablet (12.5 mg total) by mouth every morning. 30 tablet 2   ??? cholecalciferol, vitamin D3, 125 mcg (5,000 unit) tablet Take 125 mcg by mouth daily.     ??? cycloSPORINE modified (NEORAL) 100 MG capsule Take 2 capsules (200 mg total) by mouth daily AND 1 capsule (100 mg total) nightly. For a total dose of 200 mg in the morning 175mg  every evening. 270 capsule 3   ??? cycloSPORINE modified (NEORAL) 25 MG capsule Take 3 capsules (75 mg total) by mouth nightly. For a total dose of 200 mg in the morning 175mg  every evening 270 capsule 3   ??? glimepiride (AMARYL) 4 MG tablet Take 4 mg by mouth daily.      ??? insulin glargine (BASAGLAR KWIKPEN U-100 INSULIN) 100 unit/mL (3 mL) injection pen Inject 0.12 mL (12 Units total) under the skin nightly. 15 mL 12   ??? insulin lispro (HUMALOG KWIKPEN INSULIN) 100 unit/mL injection pen Inject 4 Units under the skin Three (3) times a day before meals. 15 mL 12   ??? magnesium oxide-Mg AA chelate (MAGNESIUM, AMINO ACID CHELATE,) 133 mg Tab Take 2 tablets by mouth Two (2) times a day. 100 tablet PRN   ??? melatonin 3 mg Tab Take 1 tablet (3 mg total) by mouth nightly. 30 tablet 0   ??? mirtazapine (REMERON) 15 MG tablet TAKE 1 TABLET BY MOUTH NIGHTLY 90 tablet 3   ??? mycophenolate (CELLCEPT) 250 mg capsule Take 2 capsules (500 mg total) by mouth two (2) times a day. 120 capsule 7   ??? pen needle, diabetic (ULTICARE PEN NEEDLE) 32 gauge x 5/32 (4 mm) Ndle USE WITH INSULIN PEN 4 TIMES DAILY AS DIRECTED 100 each 1   ??? predniSONE (DELTASONE) 5 MG tablet Take 4 tablets (20 mg) by mouth for one day, then take 3 tablets (15 mg) the next day, then take 2 tablets (10 mg) the next day, then take 1 tablet (5mg ) then stop 10 tablet 0   ??? pregabalin (LYRICA) 100 MG capsule Take 1 capsule (100 mg total) by mouth 2 (two) times daily 60 capsule 11   ??? pregabalin (LYRICA) 75 MG capsule Take 1 capsule (75 mg total) by mouth Two (2) times a day. 60 capsule 6   ??? rosuvastatin (CRESTOR) 5 MG tablet Take 1 tablet (5 mg total) by mouth once daily 30 tablet 11   ??? sildenafiL, pulm.hypertension, (REVATIO) 20 mg tablet Take 2 tablets (40mg ) by G-tube 3 times daily. 180 tablet 6   ??? sodium bicarbonate 650 mg tablet  Take 1 tablet (650 mg total) by mouth two (2) times a day. 100 tablet 6     No current facility-administered medications for this visit.        Changes to medications: Bairon reports no changes at this time.    No Known Allergies    Changes to allergies: No    SPECIALTY MEDICATION ADHERENCE     Mycophenolate 250mg   : 10 days of medicine on hand   Cyclosporine 100mg   : 10 days of medicine on hand   Cyclosporine 25mg   : 10 days of medicine on hand       Medication Adherence    Patient reported X missed doses in the last month: 0  Specialty Medication: mycophenolate 250mg   Patient is on additional specialty medications: Yes  Additional Specialty Medications: Cyclosporine 100mg   Patient Reported Additional Medication X Missed Doses in the Last Month: 0  Patient is on more than two specialty medications: Yes  Specialty Medication: cyclosporine 25mg   Patient Reported Additional Medication X Missed Doses in the Last Month: 0  Support network for adherence: family member          Specialty medication(s) dose(s) confirmed: Regimen is correct and unchanged.     Are there any concerns with adherence? No    Adherence counseling provided? Not needed    CLINICAL MANAGEMENT AND INTERVENTION      Clinical Benefit Assessment:    Do you feel the medicine is effective or helping your condition? Yes    Clinical Benefit counseling provided? Not needed    Adverse Effects Assessment:    Are you experiencing any side effects? No    Are you experiencing difficulty administering your medicine? No    Quality of Life Assessment:    How many days over the past month did your transplant  keep you from your normal activities? For example, brushing your teeth or getting up in the morning. 0    Have you discussed this with your provider? Not needed    Therapy Appropriateness:    Is therapy appropriate? Yes, therapy is appropriate and should be continued    DISEASE/MEDICATION-SPECIFIC INFORMATION      N/A    PATIENT SPECIFIC NEEDS     - Does the patient have any physical, cognitive, or cultural barriers? No    - Is the patient high risk? Yes, patient is taking a REMS drug. Medication is dispensed in compliance with REMS program.     - Does the patient require a Care Management Plan? No     - Does the patient require physician intervention or other additional services (i.e. nutrition, smoking cessation, social work)? No      SHIPPING     Specialty Medication(s) to be Shipped:   Transplant: mycophenolate mofetil 250mg , cyclosporine 100mg  and cyclosporine 25mg     Other medication(s) to be shipped: chlorthalidone, mgplus, lyrica  Pt denied all other meds today     Changes to insurance: No    Delivery Scheduled: Yes, Expected medication delivery date: 07/16/2019.     Medication will be delivered via UPS to the confirmed prescription address in [facility].    The patient will receive a drug information handout for each medication shipped and additional FDA Medication Guides as required.  Verified that patient has previously received a Conservation officer, historic buildings.    All of the patient's questions and concerns have been addressed.    Thad Ranger   [facility] Specialty Pharmacist

## 2019-07-18 MED FILL — PREGABALIN 75 MG CAPSULE: ORAL | 30 days supply | Qty: 60 | Fill #2

## 2019-07-18 MED FILL — CHLORTHALIDONE 25 MG TABLET: 60 days supply | Qty: 30 | Fill #1 | Status: AC

## 2019-07-18 MED FILL — CYCLOSPORINE MODIFIED 25 MG CAPSULE: 30 days supply | Qty: 90 | Fill #1 | Status: AC

## 2019-07-18 MED FILL — CYCLOSPORINE MODIFIED 100 MG CAPSULE: 30 days supply | Qty: 90 | Fill #1 | Status: AC

## 2019-07-18 MED FILL — MG-PLUS-PROTEIN 133 MG TABLET: ORAL | 25 days supply | Qty: 100 | Fill #3

## 2019-07-18 MED FILL — MYCOPHENOLATE MOFETIL 250 MG CAPSULE: 30 days supply | Qty: 120 | Fill #7 | Status: AC

## 2019-07-18 MED FILL — PREGABALIN 75 MG CAPSULE: 30 days supply | Qty: 60 | Fill #2 | Status: AC

## 2019-07-18 MED FILL — MYCOPHENOLATE MOFETIL 250 MG CAPSULE: ORAL | 30 days supply | Qty: 120 | Fill #7

## 2019-07-18 MED FILL — MG-PLUS-PROTEIN 133 MG TABLET: 25 days supply | Qty: 100 | Fill #3 | Status: AC

## 2019-07-18 MED FILL — CHLORTHALIDONE 25 MG TABLET: ORAL | 60 days supply | Qty: 30 | Fill #1

## 2019-07-18 MED FILL — CYCLOSPORINE MODIFIED 100 MG CAPSULE: ORAL | 30 days supply | Qty: 90 | Fill #1

## 2019-07-18 MED FILL — CYCLOSPORINE MODIFIED 25 MG CAPSULE: ORAL | 30 days supply | Qty: 90 | Fill #1

## 2019-07-31 MED ORDER — MIRTAZAPINE 15 MG TABLET
ORAL_TABLET | ORAL | 3 refills | 0 days | Status: CN
Start: 2019-07-31 — End: 2020-07-30

## 2019-08-02 NOTE — Unmapped (Signed)
Received call from chronic condition clinic stating they want to start new treatment on patient for his neuropathy.  Per NP Martin-Velez approved if cardiology also states this is ok.  Made local clinic aware.

## 2019-08-07 DIAGNOSIS — Z79899 Other long term (current) drug therapy: Principal | ICD-10-CM

## 2019-08-07 DIAGNOSIS — Z944 Liver transplant status: Principal | ICD-10-CM

## 2019-08-07 MED ORDER — MYCOPHENOLATE MOFETIL 250 MG CAPSULE
ORAL_CAPSULE | Freq: Two times a day (BID) | ORAL | 7 refills | 30 days | Status: CP
Start: 2019-08-07 — End: ?
  Filled 2019-08-13: qty 120, 30d supply, fill #0

## 2019-08-07 MED ORDER — MIRTAZAPINE 15 MG TABLET
ORAL_TABLET | ORAL | 3 refills | 0 days | Status: CP
Start: 2019-08-07 — End: 2020-08-06
  Filled 2019-08-13: qty 90, 90d supply, fill #0

## 2019-08-07 NOTE — Unmapped (Signed)
[facility] Refill Coordination Note    Specialty Medication(s) to be Shipped:   Transplant: mycophenolate mofetil 250mg , cyclosporine 25mg  and cyclosporine 100mg   **Sent rf request for mycophenolate**    Other medication(s) to be shipped: sodium bicarb and mirtazapine 15mg  (sent rf request), alendronate 70mg , magnesium, pregabalin 75mg , rosuvastatin 5mg  and sildenafil 20mg      Jeffrey Ward, DOB: 09-29-1950  Phone: [phone] (home) [phone] (work)      All above HIPAA information was verified with patient.     Was a Nurse, learning disability used for this call? No    Completed refill call assessment today to schedule patient's medication shipment from the [facility] [phone]).       Specialty medication(s) and dose(s) confirmed: Regimen is correct and unchanged.   Changes to medications: [name] reports no changes at this time.  Changes to insurance: No  Questions for the pharmacist: No    Confirmed patient received Welcome Packet with first shipment. The patient will receive a drug information handout for each medication shipped and additional FDA Medication Guides as required.       DISEASE/MEDICATION-SPECIFIC INFORMATION        N/A    SPECIALTY MEDICATION ADHERENCE     Medication Adherence    Patient reported X missed doses in the last month: 0  Specialty Medication: Cyclosporine 25mg   Patient is on additional specialty medications: Yes  Additional Specialty Medications: Cylosporine 100mg   Patient Reported Additional Medication X Missed Doses in the Last Month: 0  Patient is on more than two specialty medications: Yes  Specialty Medication: Mycophenolate 250mg   Patient Reported Additional Medication X Missed Doses in the Last Month: 0  Support network for adherence: family member        Cyclosporine 25 mg: 10 days of medicine on hand   Cyclosporine 100 mg: 10 days of medicine on hand   Mycophenolate 250 mg: 10 days of medicine on hand     SHIPPING     Shipping address confirmed in Epic.     Delivery Scheduled: Yes, Expected medication delivery date: 08/14/2019.     Medication will be delivered via UPS to the prescription address in Epic [facility].    Lorelei Pont [facility] Specialty Technician

## 2019-08-07 NOTE — Unmapped (Signed)
Patient has requested a medication refill via EPIC

## 2019-08-13 MED FILL — ALENDRONATE 70 MG TABLET: ORAL | 28 days supply | Qty: 4 | Fill #5

## 2019-08-13 MED FILL — CYCLOSPORINE MODIFIED 25 MG CAPSULE: 30 days supply | Qty: 90 | Fill #2 | Status: AC

## 2019-08-13 MED FILL — SODIUM BICARBONATE 650 MG TABLET: ORAL | 50 days supply | Qty: 100 | Fill #2

## 2019-08-13 MED FILL — ALENDRONATE 70 MG TABLET: 28 days supply | Qty: 4 | Fill #5 | Status: AC

## 2019-08-13 MED FILL — PREGABALIN 75 MG CAPSULE: 30 days supply | Qty: 60 | Fill #3 | Status: AC

## 2019-08-13 MED FILL — MYCOPHENOLATE MOFETIL 250 MG CAPSULE: 30 days supply | Qty: 120 | Fill #0 | Status: AC

## 2019-08-13 MED FILL — SILDENAFIL (PULMONARY HYPERTENSION) 20 MG TABLET: 30 days supply | Qty: 180 | Fill #5

## 2019-08-13 MED FILL — CYCLOSPORINE MODIFIED 100 MG CAPSULE: ORAL | 30 days supply | Qty: 90 | Fill #2

## 2019-08-13 MED FILL — ROSUVASTATIN 5 MG TABLET: 30 days supply | Qty: 30 | Fill #0 | Status: AC

## 2019-08-13 MED FILL — SODIUM BICARBONATE 650 MG TABLET: 50 days supply | Qty: 100 | Fill #2 | Status: AC

## 2019-08-13 MED FILL — MIRTAZAPINE 15 MG TABLET: 90 days supply | Qty: 90 | Fill #0 | Status: AC

## 2019-08-13 MED FILL — PREGABALIN 75 MG CAPSULE: ORAL | 30 days supply | Qty: 60 | Fill #3

## 2019-08-13 MED FILL — ROSUVASTATIN 5 MG TABLET: 30 days supply | Qty: 30 | Fill #0

## 2019-08-13 MED FILL — MG-PLUS-PROTEIN 133 MG TABLET: ORAL | 25 days supply | Qty: 100 | Fill #4

## 2019-08-13 MED FILL — CYCLOSPORINE MODIFIED 25 MG CAPSULE: ORAL | 30 days supply | Qty: 90 | Fill #2

## 2019-08-13 MED FILL — MG-PLUS-PROTEIN 133 MG TABLET: 25 days supply | Qty: 100 | Fill #4 | Status: AC

## 2019-08-13 MED FILL — CYCLOSPORINE MODIFIED 100 MG CAPSULE: 30 days supply | Qty: 90 | Fill #2 | Status: AC

## 2019-08-13 MED FILL — SILDENAFIL (PULMONARY HYPERTENSION) 20 MG TABLET: 30 days supply | Qty: 180 | Fill #5 | Status: AC

## 2019-08-22 NOTE — Unmapped (Signed)
Confirmed with patient cardiology was last seen in July 2019.  Provided [phone] to set up appointment to reestablish care to get medical clearance.

## 2019-08-23 ENCOUNTER — Institutional Professional Consult (permissible substitution): Admit: 2019-08-23 | Discharge: 2019-08-24 | Payer: MEDICARE

## 2019-08-23 NOTE — Unmapped (Signed)
Please send Device Clearance to     [facility]  [address] [place], [place] [address]    Phone: [phone]      Frequency of the treatment is 4000 to 12000 hertz with a constancy of 4000 hertz.

## 2019-08-26 ENCOUNTER — Ambulatory Visit: Admit: 2019-08-26 | Payer: MEDICARE

## 2019-08-27 NOTE — Unmapped (Signed)
Pt has a mirca pacemaker no interference should occur with electric shock therapy please maintain 6 inches away from device location.  Pt is not pacemaker dependent called and discussed this with chronic illness clinic fax number [phone].

## 2019-09-04 NOTE — Unmapped (Signed)
[facility] Pharmacist Intervention    Type of intervention: mfg change nti drug    Medication: cyclosporine 25mg     Problem: current mfg on backorder, clinic aware and ok with switch     Intervention: pt aware and ok with switch. Not filling now due to supply on hand. But pt aware if capsules look different in future fills to let coordinator know on date of switch and that labwork may be needed    Follow up needed: resetting call up for next week per patient request to go delivery and message clinic at that time    Approximate time spent: 10 minutes    Thad Ranger   [facility] Specialty Pharmacist      [facility] Clinical Assessment & Refill Coordination Note    Jeffrey Ward, DOB: 05/05/1950  Phone: [phone] (home) [phone] (work)    All above HIPAA information was verified with patient.     Was a Nurse, learning disability used for this call? 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 alendronate (FOSAMAX) 70 MG tablet Take 1 tablet (70 mg total) by mouth every seven (7) days. 4 tablet 11   ??? bisacodyl (DULCOLAX, BISACODYL,) 5 mg EC tablet Take 5 mg by mouth daily as needed.      ??? blood sugar diagnostic Strp Use three (3) times a day before meals. 100 each 7   ??? blood-glucose meter (GLUCOSE MONITORING KIT) kit Use as instructed 1 each 0   ??? chlorthalidone (HYGROTON) 25 MG tablet Take 1/2 tablet (12.5 mg total) by mouth every morning. 30 tablet 2   ??? cholecalciferol, vitamin D3, 125 mcg (5,000 unit) tablet Take 125 mcg by mouth daily.     ??? cycloSPORINE modified (NEORAL) 100 MG capsule Take 2 capsules (200 mg total) by mouth daily AND 1 capsule (100 mg total) nightly. For a total dose of 200 mg in the morning 175mg  every evening. 270 capsule 3   ??? cycloSPORINE modified (NEORAL) 25 MG capsule Take 3 capsules (75 mg total) by mouth nightly. For a total dose of 200 mg in the morning 175mg  every evening 270 capsule 3   ??? glimepiride (AMARYL) 4 MG tablet Take 4 mg by mouth daily.      ??? insulin glargine (BASAGLAR KWIKPEN U-100 INSULIN) 100 unit/mL (3 mL) injection pen Inject 0.12 mL (12 Units total) under the skin nightly. 15 mL 12   ??? insulin lispro (HUMALOG KWIKPEN INSULIN) 100 unit/mL injection pen Inject 4 Units under the skin Three (3) times a day before meals. 15 mL 12   ??? magnesium oxide-Mg AA chelate (MAGNESIUM, AMINO ACID CHELATE,) 133 mg Tab Take 2 tablets by mouth Two (2) times a day. 100 tablet PRN   ??? melatonin 3 mg Tab Take 1 tablet (3 mg total) by mouth nightly. 30 tablet 0   ??? mirtazapine (REMERON) 15 MG tablet Take 1 tablet (15 mg total) by mouth nightly. 90 tablet 3   ??? mycophenolate (CELLCEPT) 250 mg capsule Take 2 capsules (500 mg total) by mouth two (2) times a day. 120 capsule 7   ??? pen needle, diabetic (ULTICARE PEN NEEDLE) 32 gauge x 5/32 (4 mm) Ndle USE WITH INSULIN PEN 4 TIMES DAILY AS DIRECTED 100 each 1   ??? predniSONE (DELTASONE) 5 MG  tablet Take 4 tablets (20 mg) by mouth for one day, then take 3 tablets (15 mg) the next day, then take 2 tablets (10 mg) the next day, then take 1 tablet (5mg ) then stop 10 tablet 0   ??? pregabalin (LYRICA) 100 MG capsule Take 1 capsule (100 mg total) by mouth 2 (two) times daily 60 capsule 11   ??? pregabalin (LYRICA) 75 MG capsule Take 1 capsule (75 mg total) by mouth Two (2) times a day. 60 capsule 6   ??? rosuvastatin (CRESTOR) 5 MG tablet Take 1 tablet (5 mg total) by mouth once daily 30 tablet 11   ??? sildenafiL, pulm.hypertension, (REVATIO) 20 mg tablet Take 2 tablets (40mg ) by G-tube 3 times daily. 180 tablet 6   ??? sodium bicarbonate 650 mg tablet Take 1 tablet (650 mg total) by mouth two (2) times a day. 100 tablet 6     No current facility-administered medications for this visit.        Changes to medications: pt does not have med list handy but says no changes    No Known Allergies    Changes to allergies: No    SPECIALTY MEDICATION ADHERENCE     Cyclosporine 100mg   : 20 days of medicine on hand   Cyclosporine 25mg   : 20 days of medicine on hand   mycophenolate 250mg   : 20 days of medicine on hand     Medication Adherence    Patient reported X missed doses in the last month: 0  Specialty Medication: cyclosporine 100mg   Patient is on additional specialty medications: Yes  Additional Specialty Medications: Cyclosporine 25mg   Patient Reported Additional Medication X Missed Doses in the Last Month: 0  Patient is on more than two specialty medications: Yes  Specialty Medication: mycophenolate 250mg   Patient Reported Additional Medication X Missed Doses in the Last Month: 0  Support network for adherence: family member          Specialty medication(s) dose(s) confirmed: Regimen is correct and unchanged.     Are there any concerns with adherence? No    Adherence counseling provided? Not needed    CLINICAL MANAGEMENT AND INTERVENTION      Clinical Benefit Assessment:    Do you feel the medicine is effective or helping your condition? Yes    Clinical Benefit counseling provided? Not needed    Adverse Effects Assessment:    Are you experiencing any side effects? No    Are you experiencing difficulty administering your medicine? No    Quality of Life Assessment:    How many days over the past month did your transplant  keep you from your normal activities? For example, brushing your teeth or getting up in the morning. 0    Have you discussed this with your provider? Not needed    Therapy Appropriateness:    Is therapy appropriate? Yes, therapy is appropriate and should be continued    DISEASE/MEDICATION-SPECIFIC INFORMATION      N/A    PATIENT SPECIFIC NEEDS     - Does the patient have any physical, cognitive, or cultural barriers? No    - Is the patient high risk? Yes, patient is taking a REMS drug. Medication is dispensed in compliance with REMS program. - Does the patient require a Care Management Plan? No     - Does the patient require physician intervention or other additional services (i.e. nutrition, smoking cessation, social work)? No      SHIPPING     Specialty  Medication(s) to be Shipped:   na    Other medication(s) to be shipped: na  Pt wants call back in 1.5 weeks     Changes to insurance: No    Delivery Scheduled: Patient declined refill at this time due to supply on hand.     Medication will be delivered via na to the confirmed na address in [facility].    The patient will receive a drug information handout for each medication shipped and additional FDA Medication Guides as required.  Verified that patient has previously received a Conservation officer, historic buildings.    All of the patient's questions and concerns have been addressed.    Thad Ranger   [facility] Specialty Pharmacist

## 2019-09-11 NOTE — Unmapped (Signed)
[facility] Refill Coordination Note    Specialty Medication(s) to be Shipped:   na    Other medication(s) to be shipped: na  Pt's wife wants call back in 1 week     Jeffrey Ward, DOB: 1950-04-19  Phone: [phone] (home) [phone] (work)      All above HIPAA information was verified with patient's family member, wife.     Was a Nurse, learning disability used for this call? No    Completed refill call assessment today to schedule patient's medication shipment from the [facility] [phone]).       Specialty medication(s) and dose(s) confirmed: Regimen is correct and unchanged.   Changes to medications: Braylon reports no changes at this time.  Changes to insurance: No  Questions for the pharmacist: No    Confirmed patient received Welcome Packet with first shipment. The patient will receive a drug information handout for each medication shipped and additional FDA Medication Guides as required.       DISEASE/MEDICATION-SPECIFIC INFORMATION        N/A    SPECIALTY MEDICATION ADHERENCE     Medication Adherence    Patient reported X missed doses in the last month: 0  Specialty Medication: cyclosporine 100mg   Patient is on additional specialty medications: Yes  Additional Specialty Medications: Cyclosporine 25mg   Patient Reported Additional Medication X Missed Doses in the Last Month: 0  Patient is on more than two specialty medications: Yes  Specialty Medication: mycophenolate 250mg   Patient Reported Additional Medication X Missed Doses in the Last Month: 0  Support network for adherence: family member                Cyclosporine 25mg   : 15 days of medicine on hand   Cyclosporine 100mg   : 15 days of medicine on hand   Mycophenolate 250mg   : 15 days of medicine on hand       SHIPPING     Shipping address confirmed in Epic.     Delivery Scheduled: Patient declined refill at this time due to supply on hand, wants call back in 1 week.     Medication will be delivered via na to the na address in Epic W[facility].    Thad Ranger   [facility] Specialty Pharmacist

## 2019-09-13 ENCOUNTER — Institutional Professional Consult (permissible substitution): Admit: 2019-09-13 | Discharge: 2019-09-14 | Payer: MEDICARE

## 2019-09-13 NOTE — Unmapped (Signed)
Spoke with spouse. Gave reminder for patient to send in pacemaker remote report from home monitor.

## 2019-09-14 NOTE — Unmapped (Signed)
Cardiac Implantable Electronic Device Remote Monitoring     Visit Date:  09/13/2019    Findings: Normal device function, Tested Lead measurements stable and within normal range, Adequate battery reserve->8 years    Arrhythmias: No significant new atrial or ventricular arrhythmias    Plan: Continue Routine Remote Monitoring, Pt needs an annual device check.    Manufacturer of Device: Medtronic       Type of Device: Single Chamber Pacemaker  See scanned/downloaded PDF report for model numbers, serial numbers, and date(s) of implant.    Presenting Rhythm: VP    ______________________________________________________________________  Percentage Biventricular Pacing: n/a    Heart Failure Monitoring: Not Applicable  ______________________________________________________________________      Please see downloaded PDF file of transmission under Media Tab  for full details of device interrogation to include, when applicable,  battery status/charge time, lead trend data, and programmed parameters.

## 2019-09-18 MED ORDER — CHLORTHALIDONE 25 MG TABLET
ORAL_TABLET | Freq: Every morning | ORAL | 2 refills | 60.00000 days | Status: CP
Start: 2019-09-18 — End: 2020-09-17
  Filled 2019-09-20: qty 30, 60d supply, fill #0

## 2019-09-18 NOTE — Unmapped (Signed)
Spoke to patients wife about mfg change on cyclosporine.  Told her to have him  give the clinic a call to schedule labs when he starts the new mfg.  Pts wife acknowledged understanding.        [facility] Clinical Assessment & Refill Coordination Note    Oden Lindaman, DOB: 08-21-1950  Phone: [phone] (home) [phone] (work)    All above HIPAA information was verified with patient's family member, Spoke with Mr. Koeppen wife today about his medicine..     Was a translator used for this call? No    Specialty Medication(s):   Transplant: mycophenolate mofetil 250mg , cyclosporine 25mg  and cyclosporine 100mg      Current Outpatient Medications   Medication Sig Dispense Refill   ??? alendronate (FOSAMAX) 70 MG tablet Take 1 tablet (70 mg total) by mouth every seven (7) days. 4 tablet 11   ??? bisacodyl (DULCOLAX, BISACODYL,) 5 mg EC tablet Take 5 mg by mouth daily as needed.      ??? blood sugar diagnostic Strp Use three (3) times a day before meals. 100 each 7   ??? blood-glucose meter (GLUCOSE MONITORING KIT) kit Use as instructed 1 each 0   ??? chlorthalidone (HYGROTON) 25 MG tablet Take 1/2 tablet (12.5 mg total) by mouth every morning. 30 tablet 2   ??? cholecalciferol, vitamin D3, 125 mcg (5,000 unit) tablet Take 125 mcg by mouth daily.     ??? cycloSPORINE modified (NEORAL) 100 MG capsule Take 2 capsules (200 mg total) by mouth daily AND 1 capsule (100 mg total) nightly. For a total dose of 200 mg in the morning 175mg  every evening. 270 capsule 3   ??? cycloSPORINE modified (NEORAL) 25 MG capsule Take 3 capsules (75 mg total) by mouth nightly. For a total dose of 200 mg in the morning 175mg  every evening 270 capsule 3   ??? glimepiride (AMARYL) 4 MG tablet Take 4 mg by mouth daily.      ??? insulin glargine (BASAGLAR KWIKPEN U-100 INSULIN) 100 unit/mL (3 mL) injection pen Inject 0.12 mL (12 Units total) under the skin nightly. 15 mL 12   ??? insulin lispro (HUMALOG KWIKPEN INSULIN) 100 unit/mL injection pen Inject 4 Units under the skin Three (3) times a day before meals. 15 mL 12   ??? magnesium oxide-Mg AA chelate (MAGNESIUM, AMINO ACID CHELATE,) 133 mg Tab Take 2 tablets by mouth Two (2) times a day. 100 tablet PRN   ??? melatonin 3 mg Tab Take 1 tablet (3 mg total) by mouth nightly. 30 tablet 0   ??? mirtazapine (REMERON) 15 MG tablet Take 1 tablet (15 mg total) by mouth nightly. 90 tablet 3   ??? mycophenolate (CELLCEPT) 250 mg capsule Take 2 capsules (500 mg total) by mouth two (2) times a day. 120 capsule 7   ??? pen needle, diabetic (ULTICARE PEN NEEDLE) 32 gauge x 5/32 (4 mm) Ndle USE WITH INSULIN PEN 4 TIMES DAILY AS DIRECTED 100 each 1   ??? predniSONE (DELTASONE) 5 MG tablet Take 4 tablets (20 mg) by mouth for one day, then take 3 tablets (15 mg) the next day, then take 2 tablets (10 mg) the next day, then take 1 tablet (5mg ) then stop 10 tablet 0   ??? pregabalin (LYRICA) 100 MG capsule Take 1 capsule (100 mg total) by mouth 2 (two) times daily 60 capsule 11   ??? pregabalin (LYRICA) 75 MG capsule Take 1 capsule (75 mg total) by mouth Two (2)  times a day. 60 capsule 6   ??? rosuvastatin (CRESTOR) 5 MG tablet Take 1 tablet (5 mg total) by mouth once daily 30 tablet 11   ??? sildenafiL, pulm.hypertension, (REVATIO) 20 mg tablet Take 2 tablets (40mg ) by G-tube 3 times daily. 180 tablet 6   ??? sodium bicarbonate 650 mg tablet Take 1 tablet (650 mg total) by mouth two (2) times a day. 100 tablet 6     No current facility-administered medications for this visit.        Changes to medications: Tye reports no changes at this time.    No Known Allergies    Changes to allergies: No    SPECIALTY MEDICATION ADHERENCE     Cyclosporine 25 mg: 7 days of medicine on hand   Cyclosprine 100 mg: 7 days of medicine on hand   Mycophenolate 250 mg: 7 days of medicine on hand       Medication Adherence    Patient reported X missed doses in the last month: 0  Specialty Medication: Cyclosporine 100mg   Patient is on additional specialty medications: Yes  Additional Specialty Medications: Cyclosporine 25mg   Patient Reported Additional Medication X Missed Doses in the Last Month: 0  Patient is on more than two specialty medications: Yes  Specialty Medication: Mycophenolate 250mg   Patient Reported Additional Medication X Missed Doses in the Last Month: 0  Support network for adherence: family member          Specialty medication(s) dose(s) confirmed: Regimen is correct and unchanged.     Are there any concerns with adherence? No    Adherence counseling provided? Not needed    CLINICAL MANAGEMENT AND INTERVENTION      Clinical Benefit Assessment:    Do you feel the medicine is effective or helping your condition? Yes    Clinical Benefit counseling provided? Not needed    Adverse Effects Assessment:    Are you experiencing any side effects? No    Are you experiencing difficulty administering your medicine? No    Quality of Life Assessment:    How many days over the past month did your liver transplant  keep you from your normal activities? For example, brushing your teeth or getting up in the morning. 0    Have you discussed this with your provider? Not needed    Therapy Appropriateness:    Is therapy appropriate? Yes, therapy is appropriate and should be continued    DISEASE/MEDICATION-SPECIFIC INFORMATION      N/A    PATIENT SPECIFIC NEEDS     - Does the patient have any physical, cognitive, or cultural barriers? No    - Is the patient high risk? Yes, patient is taking a REMS drug. Medication is dispensed in compliance with REMS program.     - Does the patient require a Care Management Plan? No     - Does the patient require physician intervention or other additional services (i.e. nutrition, smoking cessation, social work)? No      SHIPPING     Specialty Medication(s) to be Shipped:   Transplant: mycophenolate mofetil 250mg , cyclosporine 25mg  and cyclosporine 100mg     Other medication(s) to be shipped: alendronate, chlorthalidone, mg, pregablin,rosuvastatin, sildenafil     Changes to insurance: No    Delivery Scheduled: Yes, Expected medication delivery date: 09/20/19.     Medication will be delivered via Same Day Courier to the confirmed prescription address in [facility].    The patient will receive a drug information handout for each  medication shipped and additional FDA Medication Guides as required.  Verified that patient has previously received a Conservation officer, historic buildings.    All of the patient's questions and concerns have been addressed.    Tera Helper   [id number] Specialty Pharmacist

## 2019-09-18 NOTE — Unmapped (Signed)
[facility] Pharmacist Intervention    Type of intervention: NTI mfg drug change    Medication: Cyclosporine 25mg  and Cyclosporine 100mg     Problem: Teva mfg is on backorder and need to change the mfg to apotex    Intervention: Told patients wife to have patient call the clinic to schedule labs when he starts the new mfg.     Follow up needed: Scheduled a clinical call to see how he is doing on the new mfg.    Approximate time spent: 5 minutes    Tera Helper   [facility] Specialty Pharmacist

## 2019-09-18 NOTE — Unmapped (Signed)
Refill request

## 2019-09-20 MED FILL — ALENDRONATE 70 MG TABLET: ORAL | 28 days supply | Qty: 4 | Fill #6

## 2019-09-20 MED FILL — SILDENAFIL (PULMONARY HYPERTENSION) 20 MG TABLET: 30 days supply | Qty: 180 | Fill #6 | Status: AC

## 2019-09-20 MED FILL — PREGABALIN 75 MG CAPSULE: 30 days supply | Qty: 60 | Fill #4 | Status: AC

## 2019-09-20 MED FILL — MG-PLUS-PROTEIN 133 MG TABLET: 25 days supply | Qty: 100 | Fill #5 | Status: AC

## 2019-09-20 MED FILL — ROSUVASTATIN 5 MG TABLET: 30 days supply | Qty: 30 | Fill #1 | Status: AC

## 2019-09-20 MED FILL — ROSUVASTATIN 5 MG TABLET: 30 days supply | Qty: 30 | Fill #1

## 2019-09-20 MED FILL — PREGABALIN 75 MG CAPSULE: ORAL | 30 days supply | Qty: 60 | Fill #4

## 2019-09-20 MED FILL — MYCOPHENOLATE MOFETIL 250 MG CAPSULE: ORAL | 30 days supply | Qty: 120 | Fill #1

## 2019-09-20 MED FILL — CYCLOSPORINE MODIFIED 25 MG CAPSULE: ORAL | 30 days supply | Qty: 90 | Fill #3

## 2019-09-20 MED FILL — SILDENAFIL (PULMONARY HYPERTENSION) 20 MG TABLET: 30 days supply | Qty: 180 | Fill #6

## 2019-09-20 MED FILL — MYCOPHENOLATE MOFETIL 250 MG CAPSULE: 30 days supply | Qty: 120 | Fill #1 | Status: AC

## 2019-09-20 MED FILL — CYCLOSPORINE MODIFIED 100 MG CAPSULE: 30 days supply | Qty: 90 | Fill #3 | Status: AC

## 2019-09-20 MED FILL — CYCLOSPORINE MODIFIED 25 MG CAPSULE: 30 days supply | Qty: 90 | Fill #3 | Status: AC

## 2019-09-20 MED FILL — ALENDRONATE 70 MG TABLET: 28 days supply | Qty: 4 | Fill #6 | Status: AC

## 2019-09-20 MED FILL — MG-PLUS-PROTEIN 133 MG TABLET: ORAL | 25 days supply | Qty: 100 | Fill #5

## 2019-09-20 MED FILL — CYCLOSPORINE MODIFIED 100 MG CAPSULE: ORAL | 30 days supply | Qty: 90 | Fill #3

## 2019-09-20 MED FILL — CHLORTHALIDONE 25 MG TABLET: 60 days supply | Qty: 30 | Fill #0 | Status: AC

## 2019-09-27 DIAGNOSIS — D849 Immunodeficiency, unspecified: Principal | ICD-10-CM

## 2019-09-27 DIAGNOSIS — Z8505 Personal history of malignant neoplasm of liver: Principal | ICD-10-CM

## 2019-09-27 DIAGNOSIS — Z944 Liver transplant status: Principal | ICD-10-CM

## 2019-10-09 MED ORDER — SILDENAFIL (PULMONARY HYPERTENSION) 20 MG TABLET
ORAL_TABLET | 6 refills | 0 days
Start: 2019-10-09 — End: 2020-10-08

## 2019-10-09 NOTE — Unmapped (Addendum)
[facility] Clinical Assessment & Refill Coordination Note    Jeffrey Ward, DOB: 1950-03-26  Phone: [phone] (home) [phone] (work)    All above HIPAA information was verified with patient.     Was a Nurse, learning disability used for this call? No    Specialty Medication(s):   Transplant: mycophenolate mofetil 250mg , cyclosporine 25mg  and cyclosporine 100mg      Current Outpatient Medications   Medication Sig Dispense Refill   ??? alendronate (FOSAMAX) 70 MG tablet Take 1 tablet (70 mg total) by mouth every seven (7) days. 4 tablet 11   ??? bisacodyl (DULCOLAX, BISACODYL,) 5 mg EC tablet Take 5 mg by mouth daily as needed.      ??? blood sugar diagnostic Strp Use three (3) times a day before meals. 100 each 7   ??? blood-glucose meter (GLUCOSE MONITORING KIT) kit Use as instructed 1 each 0   ??? chlorthalidone (HYGROTON) 25 MG tablet Take 1/2 tablet (12.5 mg total) by mouth every morning. 30 tablet 2   ??? cholecalciferol, vitamin D3, 125 mcg (5,000 unit) tablet Take 125 mcg by mouth daily.     ??? cycloSPORINE modified (NEORAL) 100 MG capsule Take 2 capsules (200 mg total) by mouth daily AND 1 capsule (100 mg total) nightly. For a total dose of 200 mg in the morning 175mg  every evening. 270 capsule 3   ??? cycloSPORINE modified (NEORAL) 25 MG capsule Take 3 capsules (75 mg total) by mouth nightly. For a total dose of 200 mg in the morning 175mg  every evening 270 capsule 3   ??? glimepiride (AMARYL) 4 MG tablet Take 4 mg by mouth daily.      ??? insulin glargine (BASAGLAR KWIKPEN U-100 INSULIN) 100 unit/mL (3 mL) injection pen Inject 0.12 mL (12 Units total) under the skin nightly. 15 mL 12   ??? insulin lispro (HUMALOG KWIKPEN INSULIN) 100 unit/mL injection pen Inject 4 Units under the skin Three (3) times a day before meals. 15 mL 12   ??? magnesium oxide-Mg AA chelate (MAGNESIUM, AMINO ACID CHELATE,) 133 mg Tab Take 2 tablets by mouth Two (2) times a day. 100 tablet PRN   ??? melatonin 3 mg Tab Take 1 tablet (3 mg total) by mouth nightly. 30 tablet 0   ??? mirtazapine (REMERON) 15 MG tablet Take 1 tablet (15 mg total) by mouth nightly. 90 tablet 3   ??? mycophenolate (CELLCEPT) 250 mg capsule Take 2 capsules (500 mg total) by mouth two (2) times a day. 120 capsule 7   ??? pen needle, diabetic (ULTICARE PEN NEEDLE) 32 gauge x 5/32 (4 mm) Ndle USE WITH INSULIN PEN 4 TIMES DAILY AS DIRECTED 100 each 1   ??? predniSONE (DELTASONE) 5 MG tablet Take 4 tablets (20 mg) by mouth for one day, then take 3 tablets (15 mg) the next day, then take 2 tablets (10 mg) the next day, then take 1 tablet (5mg ) then stop 10 tablet 0   ??? pregabalin (LYRICA) 100 MG capsule Take 1 capsule (100 mg total) by mouth 2 (two) times daily 60 capsule 11   ??? pregabalin (LYRICA) 75 MG capsule Take 1 capsule (75 mg total) by mouth Two (2) times a day. 60 capsule 6   ??? rosuvastatin (CRESTOR) 5 MG tablet Take 1 tablet (5 mg total) by mouth once daily 30 tablet 11   ??? sildenafiL, pulm.hypertension, (REVATIO) 20 mg tablet Take 2 tablets (40mg ) by G-tube 3 times daily. 180 tablet 6   ??? sodium bicarbonate  650 mg tablet Take 1 tablet (650 mg total) by mouth two (2) times a day. 100 tablet 6     No current facility-administered medications for this visit.        Changes to medications: Jeffrey Ward reports no changes at this time.    No Known Allergies    Changes to allergies: No    SPECIALTY MEDICATION ADHERENCE     Cyclosporine 100 mg: 10 days of medicine on hand   Cyclosporine 25 mg: 10 days of medicine on hand   Mycophenolate 250 mg: 10 days of medicine on hand       Medication Adherence    Patient reported X missed doses in the last month: 0  Specialty Medication: Cyclosporine 25mg   Patient is on additional specialty medications: Yes  Additional Specialty Medications: Cyclosporine 100mg   Patient Reported Additional Medication X Missed Doses in the Last Month: 0  Patient is on more than two specialty medications: Yes  Specialty Medication: Mycophenolate 250mg   Patient Reported Additional Medication X Missed Doses in the Last Month: 0  Support network for adherence: family member          Specialty medication(s) dose(s) confirmed: Regimen is correct and unchanged.     Are there any concerns with adherence? No    Adherence counseling provided? Not needed    CLINICAL MANAGEMENT AND INTERVENTION      Clinical Benefit Assessment:    Do you feel the medicine is effective or helping your condition? Yes    Clinical Benefit counseling provided? Not needed    Adverse Effects Assessment:    Are you experiencing any side effects? No    Are you experiencing difficulty administering your medicine? No    Quality of Life Assessment:    How many days over the past month did your liver transplant  keep you from your normal activities? For example, brushing your teeth or getting up in the morning. 0    Have you discussed this with your provider? Not needed    Therapy Appropriateness:    Is therapy appropriate? Yes, therapy is appropriate and should be continued    DISEASE/MEDICATION-SPECIFIC INFORMATION      N/A    PATIENT SPECIFIC NEEDS     - Does the patient have any physical, cognitive, or cultural barriers? No    - Is the patient high risk? Yes, patient is taking a REMS drug. Medication is dispensed in compliance with REMS program.     - Does the patient require a Care Management Plan? No     - Does the patient require physician intervention or other additional services (i.e. nutrition, smoking cessation, social work)? No      SHIPPING     Specialty Medication(s) to be Shipped:   Transplant: mycophenolate mofetil 250mg , cyclosporine 25mg  and cyclosporine 100mg     Other medication(s) to be shipped: alendronate, pregablin,rosuvastatin,sildenafil, sod bicarb     Changes to insurance: No    Delivery Scheduled: Yes, Expected medication delivery date: 10/16/19.     Medication will be delivered via UPS to the confirmed prescription address in [facility].    The patient will receive a drug information handout for each medication shipped and additional FDA Medication Guides as required.  Verified that patient has previously received a Conservation officer, historic buildings.    All of the patient's questions and concerns have been addressed.    Tera Helper   [facility] Specialty Pharmacist

## 2019-10-09 NOTE — Unmapped (Signed)
[facility] Pharmacist Intervention    Type of intervention: Change of NTI manufacturer     Medication: Cyclosporine 25mg     Problem: Cyclosporine 25mg  Apotex is on backorder and need to change to Teva mfg.    Intervention: Changing mfg to Teva.  Told the patient when he starts the new mfg to call the clinic and schedule labs. Pt acknowledged understanding.    Follow up needed: Set up for clinical next month to see how new mfg is going.    Approximate time spent: 5 minutes    Tera Helper   [facility] Specialty Pharmacist

## 2019-10-10 MED ORDER — SILDENAFIL (PULMONARY HYPERTENSION) 20 MG TABLET
ORAL_TABLET | 2 refills | 0 days | Status: CP
Start: 2019-10-10 — End: ?
  Filled 2019-10-15: qty 180, 30d supply, fill #0

## 2019-10-10 NOTE — Unmapped (Signed)
I received a sildenafil refill request.  Confirmed ok to refill with Dr. Ala Dach since it has been greater than one year since pt has been seen. Dr. Ala Dach approved to refill for 3 months and requests the pt schedule a visit. I requested schedulers to reach out to the pt to arrange a visit.

## 2019-10-15 MED FILL — MYCOPHENOLATE MOFETIL 250 MG CAPSULE: 30 days supply | Qty: 120 | Fill #2 | Status: AC

## 2019-10-15 MED FILL — SILDENAFIL (PULMONARY HYPERTENSION) 20 MG TABLET: 30 days supply | Qty: 180 | Fill #0 | Status: AC

## 2019-10-15 MED FILL — MYCOPHENOLATE MOFETIL 250 MG CAPSULE: ORAL | 30 days supply | Qty: 120 | Fill #2

## 2019-10-15 MED FILL — CYCLOSPORINE MODIFIED 100 MG CAPSULE: 30 days supply | Qty: 90 | Fill #4 | Status: AC

## 2019-10-15 MED FILL — ALENDRONATE 70 MG TABLET: ORAL | 28 days supply | Qty: 4 | Fill #7

## 2019-10-15 MED FILL — CYCLOSPORINE MODIFIED 25 MG CAPSULE: ORAL | 30 days supply | Qty: 90 | Fill #4

## 2019-10-15 MED FILL — ROSUVASTATIN 5 MG TABLET: 30 days supply | Qty: 30 | Fill #2 | Status: AC

## 2019-10-15 MED FILL — CYCLOSPORINE MODIFIED 100 MG CAPSULE: ORAL | 30 days supply | Qty: 90 | Fill #4

## 2019-10-15 MED FILL — PREGABALIN 75 MG CAPSULE: 30 days supply | Qty: 60 | Fill #5 | Status: AC

## 2019-10-15 MED FILL — CYCLOSPORINE MODIFIED 25 MG CAPSULE: 30 days supply | Qty: 90 | Fill #4 | Status: AC

## 2019-10-15 MED FILL — ROSUVASTATIN 5 MG TABLET: 30 days supply | Qty: 30 | Fill #2

## 2019-10-15 MED FILL — SODIUM BICARBONATE 650 MG TABLET: 50 days supply | Qty: 100 | Fill #3 | Status: AC

## 2019-10-15 MED FILL — PREGABALIN 75 MG CAPSULE: ORAL | 30 days supply | Qty: 60 | Fill #5

## 2019-10-15 MED FILL — ALENDRONATE 70 MG TABLET: 28 days supply | Qty: 4 | Fill #7 | Status: AC

## 2019-10-15 MED FILL — SODIUM BICARBONATE 650 MG TABLET: ORAL | 50 days supply | Qty: 100 | Fill #3

## 2019-10-21 NOTE — Unmapped (Signed)
I have reviewed the battery, threshold, lead impedance, and overall device/lead status, diagnostic data including arrhythmic events and therapies on the device evaluation on Jermell Holeman, [id number].       [name], [place]

## 2019-11-05 NOTE — Unmapped (Signed)
[facility] Shared [facility] Clinical Assessment & Refill Coordination Note    Jeffrey Ward, DOB: 1950/11/03  Phone: [phone] (home) [phone] (work)    All above HIPAA information was verified with patient.     Was a Nurse, learning disability used for this call? No    Specialty Medication(s):   Transplant: mycophenolate mofetil 250mg , cyclosporine 25mg  and cyclosporine 100mg      Current Outpatient Medications   Medication Sig Dispense Refill   ??? alendronate (FOSAMAX) 70 MG tablet Take 1 tablet (70 mg total) by mouth every seven (7) days. 4 tablet 11   ??? bisacodyl (DULCOLAX, BISACODYL,) 5 mg EC tablet Take 5 mg by mouth daily as needed.      ??? blood sugar diagnostic Strp Use three (3) times a day before meals. 100 each 7   ??? blood-glucose meter (GLUCOSE MONITORING KIT) kit Use as instructed 1 each 0   ??? chlorthalidone (HYGROTON) 25 MG tablet Take 1/2 tablet (12.5 mg total) by mouth every morning. 30 tablet 2   ??? cholecalciferol, vitamin D3, 125 mcg (5,000 unit) tablet Take 125 mcg by mouth daily.     ??? cycloSPORINE modified (NEORAL) 100 MG capsule Take 2 capsules (200 mg total) by mouth daily AND 1 capsule (100 mg total) nightly. For a total dose of 200 mg in the morning 175mg  every evening. 270 capsule 3   ??? cycloSPORINE modified (NEORAL) 25 MG capsule Take 3 capsules (75 mg total) by mouth nightly. For a total dose of 200 mg in the morning 175mg  every evening 270 capsule 3   ??? glimepiride (AMARYL) 4 MG tablet Take 4 mg by mouth daily.      ??? insulin glargine (BASAGLAR KWIKPEN U-100 INSULIN) 100 unit/mL (3 mL) injection pen Inject 0.12 mL (12 Units total) under the skin nightly. 15 mL 12   ??? insulin lispro (HUMALOG KWIKPEN INSULIN) 100 unit/mL injection pen Inject 4 Units under the skin Three (3) times a day before meals. 15 mL 12   ??? magnesium oxide-Mg AA chelate (MAGNESIUM, AMINO ACID CHELATE,) 133 mg Tab Take 2 tablets by mouth Two (2) times a day. 100 tablet PRN   ??? melatonin 3 mg Tab Take 1 tablet (3 mg total) by mouth nightly. 30 tablet 0   ??? mirtazapine (REMERON) 15 MG tablet Take 1 tablet (15 mg total) by mouth nightly. 90 tablet 3   ??? mycophenolate (CELLCEPT) 250 mg capsule Take 2 capsules (500 mg total) by mouth two (2) times a day. 120 capsule 7   ??? pen needle, diabetic (ULTICARE PEN NEEDLE) 32 gauge x 5/32 (4 mm) Ndle USE WITH INSULIN PEN 4 TIMES DAILY AS DIRECTED 100 each 1   ??? predniSONE (DELTASONE) 5 MG tablet Take 4 tablets (20 mg) by mouth for one day, then take 3 tablets (15 mg) the next day, then take 2 tablets (10 mg) the next day, then take 1 tablet (5mg ) then stop 10 tablet 0   ??? pregabalin (LYRICA) 100 MG capsule Take 1 capsule (100 mg total) by mouth 2 (two) times daily 60 capsule 11   ??? pregabalin (LYRICA) 75 MG capsule Take 1 capsule (75 mg total) by mouth Two (2) times a day. 60 capsule 6   ??? rosuvastatin (CRESTOR) 5 MG tablet Take 1 tablet (5 mg total) by mouth once daily 30 tablet 11   ??? sildenafiL, pulm.hypertension, (REVATIO) 20 mg tablet Take 2 tablets (40 mg total) by G-tube route Three (3) times a day. 180 tablet 2   ???  sodium bicarbonate 650 mg tablet Take 1 tablet (650 mg total) by mouth two (2) times a day. 100 tablet 6     No current facility-administered medications for this visit.        Changes to medications: Jarris reports no changes at this time.    No Known Allergies    Changes to allergies: No    SPECIALTY MEDICATION ADHERENCE     Cyclosporine 25 mg: 7 days of medicine on hand   Cyclosporine 100 mg: 7 days of medicine on hand   Mycophenolate 250mg  7 days of medicine on hand    Medication Adherence    Patient reported X missed doses in the last month: 1  Specialty Medication: Cyclosporine 100mg   Patient is on additional specialty medications: Yes  Additional Specialty Medications: Cyclosporine 25mg   Patient Reported Additional Medication X Missed Doses in the Last Month: 1  Patient is on more than two specialty medications: Yes  Specialty Medication: Mycophenolate 250mg   Patient Reported Additional Medication X Missed Doses in the Last Month: 0  Support network for adherence: family member          Specialty medication(s) dose(s) confirmed: Regimen is correct and unchanged.     Are there any concerns with adherence? No    Adherence counseling provided? Not needed    CLINICAL MANAGEMENT AND INTERVENTION      Clinical Benefit Assessment:    Do you feel the medicine is effective or helping your condition? Yes    Clinical Benefit counseling provided? Not needed    Adverse Effects Assessment:    Are you experiencing any side effects? No    Are you experiencing difficulty administering your medicine? No    Quality of Life Assessment:    How many days over the past month did your liver transplant  keep you from your normal activities? For example, brushing your teeth or getting up in the morning. 0    Have you discussed this with your provider? Not needed    Therapy Appropriateness:    Is therapy appropriate? Yes, therapy is appropriate and should be continued    DISEASE/MEDICATION-SPECIFIC INFORMATION      N/A    PATIENT SPECIFIC NEEDS     - Does the patient have any physical, cognitive, or cultural barriers? No    - Is the patient high risk? Yes, patient is taking a REMS drug. Medication is dispensed in compliance with REMS program    - Does the patient require a Care Management Plan? No     - Does the patient require physician intervention or other additional services (i.e. nutrition, smoking cessation, social work)? No      SHIPPING     Specialty Medication(s) to be Shipped:   Transplant: mycophenolate mofetil 250mg , cyclosporine 25mg  and cyclosporine 100mg     Other medication(s) to be shipped: alendronate, Mg, mirtazapine, pregablin, rosuvastatin,sildenafil,     Changes to insurance: No    Delivery Scheduled: Yes, Expected medication delivery date: 11/12/19.     Medication will be delivered via UPS to the confirmed prescription address in [facility].    The patient will receive a drug information handout for each medication shipped and additional FDA Medication Guides as required.  Verified that patient has previously received a Conservation officer, historic buildings.    All of the patient's questions and concerns have been addressed.    Tera Helper   [facility] Specialty Pharmacist

## 2019-11-12 MED FILL — CYCLOSPORINE MODIFIED 25 MG CAPSULE: 30 days supply | Qty: 90 | Fill #5 | Status: AC

## 2019-11-12 MED FILL — MYCOPHENOLATE MOFETIL 250 MG CAPSULE: 30 days supply | Qty: 120 | Fill #3 | Status: AC

## 2019-11-12 MED FILL — SILDENAFIL (PULMONARY HYPERTENSION) 20 MG TABLET: GASTROSTOMY | 30 days supply | Qty: 180 | Fill #1

## 2019-11-12 MED FILL — CYCLOSPORINE MODIFIED 100 MG CAPSULE: ORAL | 30 days supply | Qty: 90 | Fill #5

## 2019-11-12 MED FILL — MIRTAZAPINE 15 MG TABLET: 90 days supply | Qty: 90 | Fill #1 | Status: AC

## 2019-11-12 MED FILL — MIRTAZAPINE 15 MG TABLET: ORAL | 90 days supply | Qty: 90 | Fill #1

## 2019-11-12 MED FILL — SILDENAFIL (PULMONARY HYPERTENSION) 20 MG TABLET: 30 days supply | Qty: 180 | Fill #1 | Status: AC

## 2019-11-12 MED FILL — ROSUVASTATIN 5 MG TABLET: 30 days supply | Qty: 30 | Fill #3

## 2019-11-12 MED FILL — PREGABALIN 75 MG CAPSULE: ORAL | 30 days supply | Qty: 60 | Fill #6

## 2019-11-12 MED FILL — CYCLOSPORINE MODIFIED 100 MG CAPSULE: 30 days supply | Qty: 90 | Fill #5 | Status: AC

## 2019-11-12 MED FILL — MYCOPHENOLATE MOFETIL 250 MG CAPSULE: ORAL | 30 days supply | Qty: 120 | Fill #3

## 2019-11-12 MED FILL — MG-PLUS-PROTEIN 133 MG TABLET: 25 days supply | Qty: 100 | Fill #6 | Status: AC

## 2019-11-12 MED FILL — PREGABALIN 75 MG CAPSULE: 30 days supply | Qty: 60 | Fill #6 | Status: AC

## 2019-11-12 MED FILL — ALENDRONATE 70 MG TABLET: ORAL | 28 days supply | Qty: 4 | Fill #8

## 2019-11-12 MED FILL — ROSUVASTATIN 5 MG TABLET: 30 days supply | Qty: 30 | Fill #3 | Status: AC

## 2019-11-12 MED FILL — ALENDRONATE 70 MG TABLET: 28 days supply | Qty: 4 | Fill #8 | Status: AC

## 2019-11-12 MED FILL — CYCLOSPORINE MODIFIED 25 MG CAPSULE: ORAL | 30 days supply | Qty: 90 | Fill #5

## 2019-11-12 MED FILL — MG-PLUS-PROTEIN 133 MG TABLET: ORAL | 25 days supply | Qty: 100 | Fill #6

## 2019-11-13 NOTE — Unmapped (Signed)
Called same day MRI scheduling ([id number]) to check how to schedule MRI for pacemaker patient. Per scheduler she will e-mail cliff and nurse to get clearance and call patient to schedule. Requested her to call me at [phone] so I can coordinate provider appointment on the same day of MRI appointment and will inform patient.

## 2019-11-14 ENCOUNTER — Institutional Professional Consult (permissible substitution): Admit: 2019-11-14 | Discharge: 2019-11-15 | Payer: MEDICARE

## 2019-11-14 NOTE — Unmapped (Signed)
Patient is approved for MRI.  Patient has a  conditional device    GENERATOR INFORMATION  Health visitor #    Medtronic Micra leadless     LEAD INFORMATION  Abandoned/Epicardial Lead(s)  No   CXR verified                Prescreening Requirements  *The patient has no implanted lead extenders, lead adaptors, or abandoned leads  *The patient has no broken leads or leads with intermittent electrical contact, as confirmed by lead impedance history >200 ohms and <1500 ohms  *Implanted for more than 6 weeks-unless EP Attending approval  *Device is operating within the projected service life-Cannot be done if EOS, ERI, near ERI without Ep Attending approval  *For patients whose device will be programmed to an asynchronous pacing mode when the MRI SureScan mode is programmed to On, no diaphragmatic stimulation is present when the paced leads have a pacing output of 5.0 V and a pulse width of 1.0 ms.  *Pacing capture thresholds ? 2.0 V at 0.4 ms-if dependent (pacing left on)        MR CONDITIONAL STATUS AND MANAGEMENT      MR CONDITIONAL SYSTEM  Risks and Potential Complications discussed with patient and patient consents to procedure  The following conditions have been met:  No fractured, epicardial, or abandoned leads  MR is the best test for condition  Patient is not pacemaker Dependent    Device will be programmed under the direction of the Ep Attending by the Ep Device RN

## 2019-11-19 ENCOUNTER — Ambulatory Visit: Admit: 2019-11-19 | Discharge: 2019-11-19 | Payer: MEDICARE

## 2019-11-19 DIAGNOSIS — Z944 Liver transplant status: Principal | ICD-10-CM

## 2019-11-19 DIAGNOSIS — E1169 Type 2 diabetes mellitus with other specified complication: Principal | ICD-10-CM

## 2019-11-19 DIAGNOSIS — K766 Portal hypertension: Secondary | ICD-10-CM

## 2019-11-19 DIAGNOSIS — R29898 Other symptoms and signs involving the musculoskeletal system: Principal | ICD-10-CM

## 2019-11-19 DIAGNOSIS — R0600 Dyspnea, unspecified: Principal | ICD-10-CM

## 2019-11-19 DIAGNOSIS — I2721 Secondary pulmonary arterial hypertension: Principal | ICD-10-CM

## 2019-11-19 DIAGNOSIS — Z79899 Other long term (current) drug therapy: Principal | ICD-10-CM

## 2019-11-19 DIAGNOSIS — K769 Liver disease, unspecified: Principal | ICD-10-CM

## 2019-11-19 LAB — COMPREHENSIVE METABOLIC PANEL
ALKALINE PHOSPHATASE: 84 U/L (ref 46–116)
ALT (SGPT): 8 U/L — ABNORMAL LOW (ref 10–49)
ANION GAP: 8 mmol/L (ref 5–14)
AST (SGOT): 11 U/L (ref <=34)
BILIRUBIN TOTAL: 0.6 mg/dL (ref 0.3–1.2)
BLOOD UREA NITROGEN: 50 mg/dL — ABNORMAL HIGH (ref 9–23)
BUN / CREAT RATIO: 19
CALCIUM: 10.2 mg/dL (ref 8.7–10.4)
CHLORIDE: 111 mmol/L — ABNORMAL HIGH (ref 98–107)
CO2: 21 mmol/L (ref 20.0–31.0)
CREATININE: 2.59 mg/dL — ABNORMAL HIGH
EGFR CKD-EPI AA MALE: 28 mL/min/{1.73_m2} — ABNORMAL LOW (ref >=60)
EGFR CKD-EPI NON-AA MALE: 24 mL/min/{1.73_m2} — ABNORMAL LOW (ref >=60)
POTASSIUM: 5.4 mmol/L — ABNORMAL HIGH (ref 3.4–4.5)
PROTEIN TOTAL: 7.1 g/dL (ref 5.7–8.2)
SODIUM: 140 mmol/L (ref 135–145)

## 2019-11-19 LAB — CBC W/ AUTO DIFF
BASOPHILS ABSOLUTE COUNT: 0 10*9/L (ref 0.0–0.1)
EOSINOPHILS ABSOLUTE COUNT: 0 10*9/L (ref 0.0–0.7)
EOSINOPHILS RELATIVE PERCENT: 0.8 %
HEMATOCRIT: 38.4 % (ref 38.0–50.0)
HEMOGLOBIN: 12.9 g/dL — ABNORMAL LOW (ref 13.5–17.5)
LYMPHOCYTES ABSOLUTE COUNT: 1.1 10*9/L (ref 0.7–4.0)
MEAN CORPUSCULAR HEMOGLOBIN CONC: 33.5 g/dL (ref 30.0–36.0)
MEAN CORPUSCULAR VOLUME: 91.6 fL (ref 81.0–95.0)
MEAN PLATELET VOLUME: 9.6 fL (ref 7.0–10.0)
MONOCYTES ABSOLUTE COUNT: 0.4 10*9/L (ref 0.1–1.0)
MONOCYTES RELATIVE PERCENT: 9.1 %
NEUTROPHILS ABSOLUTE COUNT: 3 10*9/L (ref 1.7–7.7)
NEUTROPHILS RELATIVE PERCENT: 65.1 %
PLATELET COUNT: 89 10*9/L — ABNORMAL LOW (ref 150–450)
RED BLOOD CELL COUNT: 4.2 10*12/L — ABNORMAL LOW (ref 4.32–5.72)
RED CELL DISTRIBUTION WIDTH: 13.8 % (ref 12.0–15.0)
WBC ADJUSTED: 4.7 10*9/L (ref 3.5–10.5)

## 2019-11-19 LAB — PHOSPHORUS
PHOSPHORUS: 2.4 mg/dL (ref 2.4–5.1)
Phosphate:MCnc:Pt:Ser/Plas:Qn:: 2.4

## 2019-11-19 LAB — POTASSIUM: Potassium:SCnc:Pt:Ser/Plas:Qn:: 5.4 — ABNORMAL HIGH

## 2019-11-19 LAB — BASOPHILS RELATIVE PERCENT: Basophils/100 leukocytes:NFr:Pt:Bld:Qn:Automated count: 0.4

## 2019-11-19 LAB — BILIRUBIN DIRECT: Bilirubin.glucuronidated+Bilirubin.albumin bound:MCnc:Pt:Ser/Plas:Qn:: 0.3

## 2019-11-19 LAB — GAMMA GLUTAMYL TRANSFERASE: Gamma glutamyl transferase:CCnc:Pt:Ser/Plas:Qn:: 19

## 2019-11-19 LAB — MAGNESIUM: Magnesium:MCnc:Pt:Ser/Plas:Qn:: 1.8

## 2019-11-19 NOTE — Unmapped (Signed)
[facility]   Pulmonary Hypertension Program                                               Lattie Haw, MD???Director (Pulmonary)     Denice Bors. Tresa Res, MD--Associate Director (Pulmonary)    Freeman Caldron, MD (Cardiology)     Bonney Leitz, MD (Pulmonary)    Marva Panda, RN Nurse Coordinator     Claretha Cooper, RN Nurse Coordinator    [phone] office - [phone] fax          [name]  is a 68 y.o. male  patient referred by [name], MD for work up and evaluation for pulmonary hypertension.    PAH Treatment History:  Sildenafil 40 mg tid since post liver txplt hospitalization.    Pt describes a history of recent liver txplt in 09/2016.  Post op was noted to have previously unrecognized PAH and RV failure, treated initially with inhaled iloprost while on vent support and then transitioned to sildenafil.  He had a long post op course requiring a stay in rehab of about 2 weeks prior to discharge.   His post op course was further complicated by AKI (on CRRT then had renal recovery), afib, bradycardia requiring pacemaker placement.  CTA negative for PE post op.      No LE edema currently.      He continues on sildenafil 40 tid and is receiving from [facility] pharmacy.      He is no longer on warfarin.    No syncope or near syncope.      Denies dyspnea, syncope or near syncope.      Had recent hip fx s/p surgery end of Sept 2019.  Currently undergoing rehab.      Has been having URI symptoms last two day.  Antibx was called in by his PCP.  No fevers.      INTERVAL HPI 11/19/19:    Patient reports doing well.  No changes since last visit aside from feeling a little more dyspneic with activities.  He has gained weight and thinks may be related to that.    No syncope or near syncope.  No LE edema noted.    Continues on sildenafil.      Patient refuses to get COVID vaccine.        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      The patient's current NYHA functional class is Class 2 - comfortable at rest but have symptoms with ordinary physcial activity.     Will get follow up echo and check pro-BNP.     Advised patient he is high risk for severe COVID disease given immunosuppression but he still refuses vaccine.      Plan:          1) continue sildenafil 40 mg tid.      2) next visit in 6 months.      3) strongly recommended COVID vaccine.      Subjective:      Past medical history is significant for:   Past Medical History:   Diagnosis Date   ??? Atrial fibrillation (CMS-HCC)    ??? Basal cell carcinoma    ??? Cancer (CMS-HCC)    ???  Cirrhosis (CMS-HCC)    ??? Depression    ??? Diabetes (CMS-HCC)    ??? Headache    ??? Hepatitis C 07/17/2012   ??? History of transfusion    ??? Liver disease    ??? Low back pain 07/17/2012   ??? Varices, esophageal (CMS-HCC)       Family History   Problem Relation Age of Onset   ??? Hypertension Mother    ??? Cancer Mother    ??? Heart disease Mother    ??? Heart disease Father    ??? Cirrhosis Neg Hx    ??? Liver cancer Neg Hx    ??? Anemia Neg Hx    ??? Diabetes Neg Hx    ??? Kidney disease Neg Hx    ??? Obesity Neg Hx    ??? Thyroid disease Neg Hx    ??? Osteoporosis Neg Hx    ??? Coronary artery disease Neg Hx    ??? Anesthesia problems Neg Hx    ??? Basal cell carcinoma Neg Hx    ??? Squamous cell carcinoma Neg Hx       Social History     Socioeconomic History   ??? Marital status: Married     Spouse name: Not on file   ??? Number of children: Not on file   ??? Years of education: Not on file   ??? Highest education level: Not on file   Occupational History   ??? Not on file   Tobacco Use   ??? Smoking status: Never Smoker   ??? Smokeless tobacco: Never Used   ??? Tobacco comment: Smoked in high school for about 5 years.    Substance and Sexual Activity   ??? Alcohol use: No   ??? Drug use: No   ??? Sexual activity: Yes     Partners: Female     Birth control/protection: None   Other Topics Concern   ??? Do you use sunscreen? Not Asked   ??? Tanning bed use? Not Asked   ??? Are you easily burned? Not Asked   ??? Excessive sun exposure? Not Asked   ??? Blistering sunburns? Not Asked   ??? Exercise No   ??? Living Situation No   Social History Narrative   ??? Not on file     Social Determinants of Health     Financial Resource Strain:    ??? Difficulty of Paying Living Expenses:    Food Insecurity:    ??? Worried About Programme researcher, broadcasting/film/video in the Last Year:    ??? Barista in the Last Year:    Transportation Needs:    ??? Freight forwarder (Medical):    ??? Lack of Transportation (Non-Medical):    Physical Activity:    ??? Days of Exercise per Week:    ??? Minutes of Exercise per Session:    Stress:    ??? Feeling of Stress :    Social Connections:    ??? Frequency of Communication with Friends and Family:    ??? Frequency of Social Gatherings with Friends and Family:    ??? Attends Religious Services:    ??? Database administrator or Organizations:    ??? Attends Engineer, structural:    ??? Marital Status:       Past Surgical History:   Procedure Laterality Date   ??? ANKLE SURGERY     ??? BACK SURGERY     ??? BACK SURGERY     ??? CARDIAC SURGERY  pacemaker   ??? CHG US GUIDE, TISSUE ABLATION N/A 01/22/2016    Procedure: ULTRASOUND GUIDANCE FOR, AND MONITORING OF, PARENCHYMAL TISSUE ABLATION;  Surgeon: Particia Nearing, MD;  Location: MAIN OR [facility];  Service: Transplant   ??? IR EMBOLIZATION ORGAN ISCHEMIA, TUMORS, INFAR  06/16/2016    IR EMBOLIZATION ORGAN ISCHEMIA, TUMORS, INFAR 06/16/2016 Ammie Dalton, MD IMG VIR H[facility]   ??? PR COLON CA SCRN NOT HI RSK IND N/A 02/27/2015    Procedure: COLOREC CNCR SCR;COLNSCPY NO;  Surgeon: Vonda Antigua, MD;  Location: GI PROCEDURES MEMORIAL [facility];  Service: Gastroenterology   ??? PR ENDOSCOPIC ULTRASOUND EXAM N/A 02/27/2015    Procedure: UGI ENDO; W/ENDO ULTRASOUND EXAM INCLUDES ESOPHAGUS, STOMACH, &/OR DUODENUM/JEJUNUM;  Surgeon: Vonda Antigua, MD;  Location: GI PROCEDURES MEMORIAL [facility];  Service: Gastroenterology   ??? PR ERCP BALLOON DILATE BILIARY/PANC DUCT/AMPULLA EA N/A 02/10/2017    Procedure: ERCP;WITH TRANS-ENDOSCOPIC BALLOON DILATION OF BILIARY/PANCREATIC DUCT(S) OR OF AMPULLA, INCLUDING SPHINCTERECTOMY, WHEN PERFOREMD,EACH DUCT (28413);  Surgeon: Mayford Knife, MD;  Location: GI PROCEDURES MEMORIAL [facility];  Service: Gastroenterology   ??? PR ERCP REMOVE FOREIGN BODY/STENT BILIARY/PANC DUCT N/A 02/10/2017    Procedure: ENDOSCOPIC RETROGRADE CHOLANGIOPANCREATOGRAPHY (ERCP); W/ REMOVAL OF FOREIGN BODY/STENT FROM BILIARY/PANCREATIC DUCT(S);  Surgeon: Mayford Knife, MD;  Location: GI PROCEDURES MEMORIAL [facility];  Service: Gastroenterology   ??? PR ERCP REMOVE FOREIGN BODY/STENT BILIARY/PANC DUCT N/A 06/29/2017    Procedure: ENDOSCOPIC RETROGRADE CHOLANGIOPANCREATOGRAPHY (ERCP); W/ REMOVAL OF FOREIGN BODY/STENT FROM BILIARY/PANCREATIC DUCT(S);  Surgeon: Vonda Antigua, MD;  Location: GI PROCEDURES MEMORIAL [facility];  Service: Gastroenterology   ??? PR ERCP STENT PLACEMENT BILIARY/PANCREATIC DUCT N/A 11/29/2016    Procedure: ENDOSCOPIC RETROGRADE CHOLANGIOPANCREATOGRAPHY (ERCP); WITH PLACEMENT OF ENDOSCOPIC STENT INTO BILIARY OR PANCREATIC DUCT;  Surgeon: Chriss Driver, MD;  Location: GI PROCEDURES MEMOR[facility];  Service: Gastroenterology   ??? PR ERCP STENT PLACEMENT BILIARY/PANCREATIC DUCT N/A 04/26/2017    Procedure: ENDOSCOPIC RETROGRADE CHOLANGIOPANCREATOGRAPHY (ERCP); WITH PLACEMENT OF ENDOSCOPIC STENT INTO BILIARY OR PANCREATIC DUCT;  Surgeon: Mayford Knife, MD;  Location: GI PROCEDURES MEMOR[facility];  Service: Gastroenterology   ??? PR ERCP,W/REMOVAL STONE,BIL/PANCR DUCTS N/A 11/29/2016    Procedure: ERCP; W/ENDOSCOPIC RETROGRADE REMOVAL OF CALCULUS/CALCULI FROM BILIARY &/OR PANCREATIC DUCTS;  Surgeon: Chriss Driver, MD;  Location: GI PROCEDURES MEMORIAL [facility];  Service: Gastroenterology   ??? PR ERCP,W/REMOVAL STONE,BIL/PANCR DUCTS N/A 02/10/2017    Procedure: ERCP; W/ENDOSCOPIC RETROGRADE REMOVAL OF CALCULUS/CALCULI FROM BILIARY &/OR PANCREATIC DUCTS;  Surgeon: Mayford Knife, MD;  Location: GI PROCEDURES MEMORIAL [facility];  Service: Gastroenterology   ??? PR ERCP,W/REMOVAL STONE,BIL/PANCR DUCTS N/A 04/26/2017    Procedure: ERCP; W/ENDOSCOPIC RETROGRADE REMOVAL OF CALCULUS/CALCULI FROM BILIARY &/OR PANCREATIC DUCTS;  Surgeon: Mayford Knife, MD;  Location: GI PROCEDURES MEMORIAL [facility];  Service: Gastroenterology   ??? PR ERCP,W/REMOVAL STONE,BIL/PANCR DUCTS N/A 06/29/2017    Procedure: ERCP; W/ENDOSCOPIC RETROGRADE REMOVAL OF CALCULUS/CALCULI FROM BILIARY &/OR PANCREATIC DUCTS;  Surgeon: Vonda Antigua, MD;  Location: GI PROCEDURES MEMORIAL [facility];  Service: Gastroenterology   ??? PR ESOPHAGOSCP RIG TRANSORAL HYPOPHARYNX CRV ESOPH N/A 04/04/2018    Procedure: ESOPHAGOSCOPY, RIGID, TRANSORAL WITH DIVERTICULECTOMY OF HYPOPHARYNX OR CERVICAL ESOPHAGUS, WITH CRICOPHARYNGEAL MYOTOMY, INCLUDES USE OF TELESCOPE OR OPERATING MICROSCOPE AND REPAIR, WHEN PERFORMED;  Surgeon: Vista Deck, MD;  Location: MAIN OR [facility];  Service: ENT   ??? PR INSER HEART TEMP PACER ONE CHMBR N/A 10/02/2016    Procedure: Tempoarary Pacemaker Insertion;  Surgeon: Meredith Leeds, MD;  Location: [facility];  Service: Cardiology   ??? PR LAP,DIAGNOSTIC ABDOMEN N/A 01/22/2016    Procedure: Laparoscopy, Abdomen, Peritoneum, & Omentum, Diagnostic, W/Wo Collection Specimen(S) By Brushing Or Washing;  Surgeon: Particia Nearing, MD;  [facility];  Service: Transplant   ??? PR LARYNGOPLASTY MEDIALIZATION UNLIATERAL N/A 11/07/2018    Procedure: R31 LARYNGOPLASTY, MEDIALIZATION, UNILATERAL;  Surgeon: Vista Deck, MD;  Location: MAIN OR [facility];  Service: ENT   ??? PR PLACE PERCUT GASTROSTOMY TUBE N/A 11/17/2016    Procedure: UGI ENDO; W/DIRECTED PLCMT PERQ GASTROSTOMY TUBE;  Surgeon: Cletis Athens, MD;  Location: GI PROCEDURES MEMORIAL [facility];  Service: Gastroenterology   ??? PR TRACHEOSTOMY, PLANNED N/A 09/29/2016    Procedure: TRACHEOSTOMY PLANNED (SEPART PROC); Surgeon: Katherina Mires, MD;  Location: MAIN OR [facility];  Service: Trauma   ??? PR TRANSCATH INSERT OR REPLACE LEADLESS PM VENTR N/A 10/11/2016    Procedure: Pacemaker Implant/Replace Leadless;  Surgeon: Meredith Leeds, MD;  Location: [facility];  Service: Cardiology   ??? PR TRANSPLANT LIVER,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    Procedure: Liver Allotransplantation; Orthotopic, Partial Or Whole, From Cadaver Or Living Donor, Any Age;  Surgeon: Doyce Loose, MD;  Location: MAIN OR [facility];  Service: Transplant   ??? PR TRANSPLANT,PREP DONOR LIVER, WHOLE N/A 09/15/2016    Procedure: Rogelia Boga Std Prep Cad Donor Whole Liver Gft Prior Tnsplnt,Inc Chole,Diss/Rem Surr Tissu Wo Triseg/Lobe Splt;  Surgeon: Doyce Loose, MD;  Location: MAIN OR [facility];  Service: Transplant   ??? PR UPPER GI ENDOSCOPY,BIOPSY N/A 07/17/2012    Procedure: UGI ENDOSCOPY; WITH BIOPSY, SINGLE OR MULTIPLE;  Surgeon: Alba Destine, MD;  Location: GI PROCEDURES [facility];  Service: Gastroenterology   ??? PR UPPER GI ENDOSCOPY,DIAGNOSIS N/A 02/04/2014    Procedure: UGI ENDO, INCLUDE ESOPHAGUS, STOMACH, & DUODENUM &/OR JEJUNUM; DX W/WO COLLECTION SPECIMN, BY BRUSH OR WASH;  Surgeon: Wilburt Finlay, MD;  Location: GI PROCEDURES [facility]ORIAL [facility];  Service: Gastroenterology   ??? PR UPPER GI ENDOSCOPY,LIGAT VARIX N/A 11/05/2013    Procedure: UGI ENDO; W/BAND LIG ESOPH &/OR GASTRIC VARICES;  Surgeon: Wilburt Finlay, MD;  Location: GI PROCEDURES [facility];  Service: Gastroenterology        Objective:   Objective   Review of systems is significant for:     As stated in the HPI, remainder of 12 system review is negative.      No Known Allergies   Current Outpatient Medications   Medication Sig Dispense Refill   ??? amiodarone (PACERONE) 200 MG tablet Take 200 mg by mouth daily.     ??? atorvastatin (LIPITOR) 20 MG tablet Take 20 mg by mouth daily.     ??? chlorthalidone (HYGROTON) 25 MG tablet Take 1/2 tablet (12.5 mg total) by mouth every morning. 30 tablet 2   ??? cycloSPORINE modified (NEORAL) 100 MG capsule Take 2 capsules (200 mg total) by mouth daily AND 1 capsule (100 mg total) nightly. For a total dose of 200 mg in the morning 175mg  every evening. 270 capsule 3   ??? cycloSPORINE modified (NEORAL) 25 MG capsule Take 3 capsules (75 mg total) by mouth nightly. For a total dose of 200 mg in the morning 175mg  every evening 270 capsule 3   ??? mycophenolate (CELLCEPT) 250 mg capsule Take 2 capsules (500 mg total) by mouth two (2) times a day. 120 capsule 7   ??? predniSONE (DELTASONE) 5 MG tablet Take 4 tablets (20 mg) by mouth for one day, then take 3 tablets (15 mg)  the next day, then take 2 tablets (10 mg) the next day, then take 1 tablet (5mg ) then stop 10 tablet 0   ??? rosuvastatin (CRESTOR) 5 MG tablet Take 1 tablet (5 mg total) by mouth once daily 30 tablet 11   ??? sildenafiL, pulm.hypertension, (REVATIO) 20 mg tablet Take 2 tablets (40 mg total) by G-tube route Three (3) times a day. (Patient taking differently: Take 40 mg by mouth Three (3) times a day. ) 180 tablet 2   ??? alendronate (FOSAMAX) 70 MG tablet Take 1 tablet (70 mg total) by mouth every seven (7) days. 4 tablet 11   ??? bisacodyl (DULCOLAX, BISACODYL,) 5 mg EC tablet Take 5 mg by mouth daily as needed.      ??? blood sugar diagnostic Strp Use three (3) times a day before meals. 100 each 7   ??? blood-glucose meter (GLUCOSE MONITORING KIT) kit Use as instructed 1 each 0   ??? cholecalciferol, vitamin D3, 125 mcg (5,000 unit) tablet Take 125 mcg by mouth daily.     ??? glimepiride (AMARYL) 4 MG tablet Take 4 mg by mouth daily.      ??? insulin glargine (BASAGLAR KWIKPEN U-100 INSULIN) 100 unit/mL (3 mL) injection pen Inject 0.12 mL (12 Units total) under the skin nightly. 15 mL 12   ??? insulin lispro (HUMALOG KWIKPEN INSULIN) 100 unit/mL injection pen Inject 4 Units under the skin Three (3) times a day before meals. 15 mL 12   ??? magnesium oxide-Mg AA chelate (MAGNESIUM, AMINO ACID CHELATE,) 133 mg Tab Take 2 tablets by mouth Two (2) times a day. 100 tablet PRN   ??? melatonin 3 mg Tab Take 1 tablet (3 mg total) by mouth nightly. 30 tablet 0   ??? mirtazapine (REMERON) 15 MG tablet Take 1 tablet (15 mg total) by mouth nightly. 90 tablet 3   ??? pen needle, diabetic (ULTICARE PEN NEEDLE) 32 gauge x 5/32 (4 mm) Ndle USE WITH INSULIN PEN 4 TIMES DAILY AS DIRECTED 100 each 1   ??? pregabalin (LYRICA) 100 MG capsule Take 1 capsule (100 mg total) by mouth 2 (two) times daily 60 capsule 11   ??? pregabalin (LYRICA) 75 MG capsule Take 1 capsule (75 mg total) by mouth Two (2) times a day. 60 capsule 6   ??? sodium bicarbonate 650 mg tablet Take 1 tablet (650 mg total) by mouth two (2) times a day. 100 tablet 6     No current facility-administered medications for this visit.        Physical Exam:    weight is 86.5 kg (190 lb 9.6 oz). His temporal temperature is 36.6 ??C (97.9 ??F). His blood pressure is 148/81 and his pulse is 67. His oxygen saturation is 98%.   General Appearance:    Alert, cooperative, no distress, appears stated age   Head:    Normocephalic, without obvious abnormality, atraumatic   Eyes:    PERRL, conjunctiva clear, EOM's intact    Oropharynx:   No lesions   Neck:   Supple, trachea midline, no adenopathy;    No JVD   Lungs:     Good breath sounds b/l, no crackles or wheezes, respirations unlabored   Chest Wall:    No tenderness or deformity    Heart:    Regular rate and rhythm, S1 and S2 normal, no murmur, rub   or gallop   Abdomen:     Soft, non-tender, normal bowel sounds, no masses, no organomegaly.  + G tube  in place   Extremities:   Extremities normal, no cyanosis, clubbing, or edema   Pulses:   2+ and symmetric all extremities   Skin:   No rashes or lesions   Lymph nodes:   No cervical or supraclavicular adenopathy   Neurologic:   No focal neurological deficits       Diagnostic Review:    Echo 04/21/17:    Left Ventricle The left ventricle is normal in size with normal wall thickness.   The left ventricular systolic function is normal.   The ejection fraction is visually estimated at 65 to 70%.   There is impaired left ventricular relaxation with normal left ventricular filling pressure consistent with grade I diastolic dysfunction.   Mitral Valve The mitral valve leaflets are normal, with normal leaflet mobility.   There is no significant mitral regurgitation by color and continuous wave Doppler imaging.   Left Atrium The left atrium is dilated.   Aortic Valve The aortic valve is poorly visualized with probably normal excursion.   There is no significant aortic regurgitation by color and continuous wave Doppler imaging.  There is no evidence of a significant transvalvular gradient.   - Peak transvalvular velocity: 1 m/sec   Ascending Aorta The aorta is normal in size in the visualized segments.   Pulmonary Artery The pulmonary artery is not well visualized.   Pulmonic Valve The pulmonic valve is poorly visualized, but probably normal.   There is no significant pulmonic regurgitation by color and continuous wave Doppler imaging.   There is no evidence of a significant transvalvular gradient.   - Peak transvalvular velocity: 0.9 m/sec   Right Ventricle The right ventricle is normal in size with normal wall thickness.   The right ventricular systolic function is normal.   Tricuspid Valve The tricuspid valve leaflets are normal, with normal leaflet mobility.   There is trivial tricuspid regurgitation by color and continuous wave Doppler imaging.   - The Doppler signal is suboptimal and pulmonary artery systolic pressure cannot be accurately estimated.   Right Atrium The right atrium is normal in size.   IVC/SVC The IVC diameter is <=21 mm with >50% decrease in size with inspiration suggesting normal right atrial pressure (0-5 mm Hg).   Pericardium There is no evidence of a significant pericardial effusion.         V/Q scan 01/11/17:    Impression     Normal ventilation and perfusion scan.         CT scan PE protocol performed on 09/2016 post op revealed:    Impression     No CT evidence for acute pulmonary embolism, as clinically questioned.    Moderate bilateral pleural effusions similar to prior CT dated 01/30/2016 with slightly increased atelectasis of the adjacent lung parenchyma in the bilateral lower lobes, left greater than right.    Addendum (Dr.Altun dictating 09/27/2016 08:35 AM) Focal contour bulging of the liver adjacent to the diaphragm and right kidney is noted which is not well evaluated due to the presence of streak artifacts.          Pulmonary Function Test performed on 11/24/15 show:            Consistent with normal spirometry and normal DLCO    6 Minute Walk Test performed on 01/06/17:         Echo performed on 09/27/16:    Echocardiographic Findings     Left Ventricle The left ventricle is normal in size with normal wall  thickness.   The left ventricular systolic function is normal.   The ejection fraction is visually estimated at 60 to 65%.   Left ventricular diastolic function cannot be accurately assessed.      Left Atrium The left atrium is normal in size.      Right Ventricle The right ventricle is moderately dilated.   The right ventricular systolic function is decreased.      Tricuspid Valve The tricuspid valve is not well visualized.   There is mild tricuspid regurgitation by color and continuous wave Doppler imaging.   The regurgitation jet is directed toward the septum.   The pulmonary artery systolic pressure is severely elevated.   - Peak tricuspid regurgitant velocity: 4.6 m/sec  - Estimated pulmonary artery systolic pressure: 80 + RA pressure mmHg      Right Atrium The right atrium is poorly visualized but appears dilated      IVC/SVC Mechanical ventilation precludes the ability to accurately assess right atrial pressure.      Pericardium There is a small anterior pericardial effusion.          [facility] ICU Catheterization performed on date 09/22/16:     Wedge pressure initially looks to be 15-16 but with further balloon deflation this clearly decreases further to 8 mmHg with very good quality tracing. At that time mPAP 50, CVP 9, CO by CCO measurements 4.1/CI 2.2, PVR approximating 10 WU.

## 2019-11-19 NOTE — Unmapped (Signed)
Thank you for visiting the [facility].    If you have any questions or concerns, please call     H. James Garnetta Fedrick, MD    Laura Nowicki, RN  Sara Decker, RN    Pulmonary Hypertension nurse coordinators:    [phone]

## 2019-11-20 LAB — CYCLOSPORINE, TROUGH: Lab: 417 — ABNORMAL HIGH

## 2019-11-20 LAB — PRO-BNP: Serology studies:Cmplx:-:^Patient:Set:: 305 — ABNORMAL HIGH

## 2019-11-22 NOTE — Unmapped (Signed)
Talked with patient about his 9/7 labs as they were drawn at 1644 and he confirmed taking his medications prior to labs; he normally takes CsA at 9am and 9pm.     Discussed that his creatinine was elevated at 2.59 and K+ 5.4; encouraged patient to increase hydration and he mentioned he drinks a lot of water. Reminded him that if he is outside in the heat that he is sweating out what he is drinking and to increase this amount to compensate.     Mentioned that we were going to have him repeat his labs in October with labs 12 hours after his night dose that would be closer to 9am in the morning and taking medications after but noticed that his previous coordinator had ordered his annual testing and a message in the checklist for him to hopefully be able to be seen in October. Mentioned [facility] will be calling him in the near future to schedule this and we can see labs again.   Discussed that his other labs are stable and LFTs are good.

## 2019-12-04 MED ORDER — PREGABALIN 75 MG CAPSULE
ORAL_CAPSULE | Freq: Two times a day (BID) | ORAL | 6 refills | 30.00000 days
Start: 2019-12-04 — End: ?

## 2019-12-04 NOTE — Unmapped (Signed)
[facility] Refill Coordination Note    Specialty Medication(s) to be Shipped:   Transplant: mycophenolate mofetil 250mg , cyclosporine 25mg  and cyclosporine 100mg     Other medication(s) to be shipped: aldendronate, Mg, Pregablin, rosuvastatin, sildenafil     Jeffrey Ward, DOB: 1950/06/10  Phone: [phone] (home) [phone] (work)      All above HIPAA information was verified with patient.     Was a Nurse, learning disability used for this call? No    Completed refill call assessment today to schedule patient's medication shipment from the [facility] [phone]).       Specialty medication(s) and dose(s) confirmed: Regimen is correct and unchanged.   Changes to medications: Jeffrey Ward reports no changes at this time.  Changes to insurance: No  Questions for the pharmacist: No    Confirmed patient received Welcome Packet with first shipment. The patient will receive a drug information handout for each medication shipped and additional FDA Medication Guides as required.       DISEASE/MEDICATION-SPECIFIC INFORMATION        N/A    SPECIALTY MEDICATION ADHERENCE     Medication Adherence    Patient reported X missed doses in the last month: 0  Specialty Medication: Cyclosporine 100mg   Patient is on additional specialty medications: Yes  Additional Specialty Medications: Cyclosporine 25mg   Patient Reported Additional Medication X Missed Doses in the Last Month: 0  Patient is on more than two specialty medications: Yes  Specialty Medication: Mycophenolate 250mg   Patient Reported Additional Medication X Missed Doses in the Last Month: 0  Support network for adherence: family member                Cyclosporine 25 mg: 7 days of medicine on hand   Cyclosporine 100 mg: 7 days of medicine on hand   Mycophenolate 250 mg: 7 days of medicine on hand *    SHIPPING     Shipping address confirmed in Epic.     Delivery Scheduled: Yes, Expected medication delivery date: 12/09/19.     Medication will be delivered via Same Day Courier to the prescription address in Epic WAM.    Tera Helper   [facility] Specialty Pharmacist

## 2019-12-06 MED ORDER — PREGABALIN 75 MG CAPSULE
ORAL_CAPSULE | Freq: Two times a day (BID) | ORAL | 6 refills | 30.00000 days
Start: 2019-12-06 — End: ?

## 2019-12-09 MED FILL — SILDENAFIL (PULMONARY HYPERTENSION) 20 MG TABLET: 30 days supply | Qty: 180 | Fill #2 | Status: AC

## 2019-12-09 MED FILL — SILDENAFIL (PULMONARY HYPERTENSION) 20 MG TABLET: GASTROSTOMY | 30 days supply | Qty: 180 | Fill #2

## 2019-12-09 MED FILL — ROSUVASTATIN 5 MG TABLET: 30 days supply | Qty: 30 | Fill #4

## 2019-12-09 MED FILL — ROSUVASTATIN 5 MG TABLET: 30 days supply | Qty: 30 | Fill #4 | Status: AC

## 2019-12-09 MED FILL — ALENDRONATE 70 MG TABLET: 28 days supply | Qty: 4 | Fill #9 | Status: AC

## 2019-12-09 MED FILL — MYCOPHENOLATE MOFETIL 250 MG CAPSULE: ORAL | 30 days supply | Qty: 120 | Fill #4

## 2019-12-09 MED FILL — ALENDRONATE 70 MG TABLET: ORAL | 28 days supply | Qty: 4 | Fill #9

## 2019-12-09 MED FILL — CYCLOSPORINE MODIFIED 25 MG CAPSULE: 30 days supply | Qty: 90 | Fill #6 | Status: AC

## 2019-12-09 MED FILL — CYCLOSPORINE MODIFIED 100 MG CAPSULE: ORAL | 30 days supply | Qty: 90 | Fill #6

## 2019-12-09 MED FILL — CYCLOSPORINE MODIFIED 100 MG CAPSULE: 30 days supply | Qty: 90 | Fill #6 | Status: AC

## 2019-12-09 MED FILL — MG-PLUS-PROTEIN 133 MG TABLET: ORAL | 25 days supply | Qty: 100 | Fill #7

## 2019-12-09 MED FILL — MYCOPHENOLATE MOFETIL 250 MG CAPSULE: 30 days supply | Qty: 120 | Fill #4 | Status: AC

## 2019-12-09 MED FILL — MG-PLUS-PROTEIN 133 MG TABLET: 25 days supply | Qty: 100 | Fill #7 | Status: AC

## 2019-12-09 MED FILL — CYCLOSPORINE MODIFIED 25 MG CAPSULE: ORAL | 30 days supply | Qty: 90 | Fill #6

## 2019-12-10 ENCOUNTER — Ambulatory Visit: Admit: 2019-12-10 | Discharge: 2019-12-11 | Payer: MEDICARE

## 2019-12-16 ENCOUNTER — Ambulatory Visit: Admit: 2019-12-16 | Payer: MEDICARE

## 2020-01-06 MED ORDER — SILDENAFIL (PULMONARY HYPERTENSION) 20 MG TABLET
ORAL_TABLET | Freq: Three times a day (TID) | GASTROSTOMY | 2 refills | 30.00000 days | Status: CN
Start: 2020-01-06 — End: ?

## 2020-01-06 NOTE — Unmapped (Signed)
[facility] Refill Coordination Note    Specialty Medication(s) to be Shipped:   Transplant: mycophenolate mofetil 250mg , cyclosporine 100mg  and cyclosporine 25mg     Other medication(s) to be shipped:      Sildenafil  Rosuvastatin  Mag 133mg   Alendronate       Jeffrey Ward, DOB: 1950/06/18  Phone: [phone] (home) [phone] (work)      All above HIPAA information was verified with patient.     Was a Nurse, learning disability used for this call? No    Completed refill call assessment today to schedule patient's medication shipment from the [facility] [phone]).       Specialty medication(s) and dose(s) confirmed: Regimen is correct and unchanged.   Changes to medications: [place] reports no changes at this time.  Changes to insurance: No  Questions for the pharmacist: No    Confirmed patient received Welcome Packet with first shipment. The patient will receive a drug information handout for each medication shipped and additional FDA Medication Guides as required.       DISEASE/MEDICATION-SPECIFIC INFORMATION        N/A    SPECIALTY MEDICATION ADHERENCE     Medication Adherence    Patient reported X missed doses in the last month: 0  Support network for adherence: family member       mycophenolate mofetil 250mg : 10 days worth of medication on hand.   cyclosporine 100mg : 10 days worth of medication on hand.  cyclosporine 25mg : 10 days worth of medication on hand.          SHIPPING     Shipping address confirmed in Epic.     Delivery Scheduled: Yes, Expected medication delivery date: 01/14/20.     Medication will be delivered via UPS to the prescription address in Epic WAM.    Jeffrey Ward   [facility] Specialty Technician

## 2020-01-07 MED ORDER — SILDENAFIL (PULMONARY HYPERTENSION) 20 MG TABLET
ORAL_TABLET | Freq: Three times a day (TID) | ORAL | 5 refills | 30.00000 days | Status: CP
Start: 2020-01-07 — End: ?
  Filled 2020-01-13: qty 180, 30d supply, fill #0

## 2020-01-08 DIAGNOSIS — I272 Pulmonary hypertension, unspecified: Principal | ICD-10-CM

## 2020-01-09 NOTE — Unmapped (Signed)
I received notice that sildenafil is approved from 10/11/2019-01/08/2021. PA [id number].  I notified [place] [facility]. Approval scanned to chart.

## 2020-01-09 NOTE — Unmapped (Signed)
I completed sildenafil PA (#180/30) on CMM. Will watch for determination.

## 2020-01-13 MED FILL — MYCOPHENOLATE MOFETIL 250 MG CAPSULE: ORAL | 30 days supply | Qty: 120 | Fill #5

## 2020-01-13 MED FILL — SILDENAFIL (PULMONARY HYPERTENSION) 20 MG TABLET: 30 days supply | Qty: 180 | Fill #0 | Status: AC

## 2020-01-13 MED FILL — CYCLOSPORINE MODIFIED 25 MG CAPSULE: ORAL | 30 days supply | Qty: 90 | Fill #7

## 2020-01-13 MED FILL — MG-PLUS-PROTEIN 133 MG TABLET: 25 days supply | Qty: 100 | Fill #8 | Status: AC

## 2020-01-13 MED FILL — ALENDRONATE 70 MG TABLET: 28 days supply | Qty: 4 | Fill #10 | Status: AC

## 2020-01-13 MED FILL — ROSUVASTATIN 5 MG TABLET: 30 days supply | Qty: 30 | Fill #5

## 2020-01-13 MED FILL — CYCLOSPORINE MODIFIED 25 MG CAPSULE: 30 days supply | Qty: 90 | Fill #7 | Status: AC

## 2020-01-13 MED FILL — MYCOPHENOLATE MOFETIL 250 MG CAPSULE: 30 days supply | Qty: 120 | Fill #5 | Status: AC

## 2020-01-13 MED FILL — MG-PLUS-PROTEIN 133 MG TABLET: ORAL | 25 days supply | Qty: 100 | Fill #8

## 2020-01-13 MED FILL — ROSUVASTATIN 5 MG TABLET: 30 days supply | Qty: 30 | Fill #5 | Status: AC

## 2020-01-13 MED FILL — ALENDRONATE 70 MG TABLET: ORAL | 28 days supply | Qty: 4 | Fill #10

## 2020-01-13 NOTE — Unmapped (Signed)
Spoke to Mr Istre about the mfg change on cyclosporine 100mg .   Told him that we have to change from apotex to teva.  Told him to give the clinic a call to schedule labs when he starts the new mfg.  Pt acknowledged understanding.

## 2020-01-14 MED FILL — CYCLOSPORINE MODIFIED 100 MG CAPSULE: 30 days supply | Qty: 90 | Fill #7 | Status: AC

## 2020-01-14 MED FILL — CYCLOSPORINE MODIFIED 100 MG CAPSULE: ORAL | 30 days supply | Qty: 90 | Fill #7

## 2020-01-14 NOTE — Unmapped (Signed)
[facility] Pharmacist Intervention    Type of intervention: Change MFG on NTI drug    Medication: Cyclosporine 100mg     Problem: Apotex MFG is not available and need to change to Teva mfg    Intervention: Spoke to Mr. Hochstetler and told him of the change from apotex to teva.  Told him to give the clinical a call to schedule labs when he starts the new mfg. Pt acknowledged understanding.    Follow up needed: Set up for clinical next month to see how new mfg is going.    Approximate time spent: 10 minutes    Tera Helper   [facility] Specialty Pharmacist

## 2020-01-20 ENCOUNTER — Ambulatory Visit: Admit: 2020-01-20 | Discharge: 2020-01-21 | Payer: MEDICARE

## 2020-01-20 DIAGNOSIS — Z79899 Other long term (current) drug therapy: Principal | ICD-10-CM

## 2020-01-20 DIAGNOSIS — K769 Liver disease, unspecified: Principal | ICD-10-CM

## 2020-01-20 DIAGNOSIS — Z944 Liver transplant status: Principal | ICD-10-CM

## 2020-01-20 LAB — CBC W/ AUTO DIFF
BASOPHILS ABSOLUTE COUNT: 0 10*9/L (ref 0.0–0.1)
BASOPHILS RELATIVE PERCENT: 0.6 %
EOSINOPHILS ABSOLUTE COUNT: 0 10*9/L (ref 0.0–0.7)
EOSINOPHILS RELATIVE PERCENT: 0.8 %
HEMATOCRIT: 36.3 % — ABNORMAL LOW (ref 38.0–50.0)
HEMOGLOBIN: 11.9 g/dL — ABNORMAL LOW (ref 13.5–17.5)
LYMPHOCYTES ABSOLUTE COUNT: 1.2 10*9/L (ref 0.7–4.0)
LYMPHOCYTES RELATIVE PERCENT: 23.3 %
MEAN CORPUSCULAR HEMOGLOBIN CONC: 32.7 g/dL (ref 30.0–36.0)
MEAN CORPUSCULAR HEMOGLOBIN: 29.8 pg (ref 26.0–34.0)
MEAN CORPUSCULAR VOLUME: 91.1 fL (ref 81.0–95.0)
MEAN PLATELET VOLUME: 9.7 fL (ref 7.0–10.0)
MONOCYTES ABSOLUTE COUNT: 0.4 10*9/L (ref 0.1–1.0)
MONOCYTES RELATIVE PERCENT: 8.7 %
NEUTROPHILS ABSOLUTE COUNT: 3.4 10*9/L (ref 1.7–7.7)
NEUTROPHILS RELATIVE PERCENT: 66.6 %
NUCLEATED RED BLOOD CELLS: 0 /100{WBCs} (ref <=4)
PLATELET COUNT: 91 10*9/L — ABNORMAL LOW (ref 150–450)
RED BLOOD CELL COUNT: 3.99 10*12/L — ABNORMAL LOW (ref 4.32–5.72)
RED CELL DISTRIBUTION WIDTH: 13.4 % (ref 12.0–15.0)
WBC ADJUSTED: 5.1 10*9/L (ref 3.5–10.5)

## 2020-01-20 LAB — MAGNESIUM: MAGNESIUM: 1.4 mg/dL — ABNORMAL LOW (ref 1.6–2.6)

## 2020-01-20 LAB — PHOSPHORUS: PHOSPHORUS: 2.8 mg/dL (ref 2.4–5.1)

## 2020-01-20 LAB — COMPREHENSIVE METABOLIC PANEL
ALBUMIN: 3.5 g/dL (ref 3.4–5.0)
ALKALINE PHOSPHATASE: 77 U/L (ref 46–116)
ALT (SGPT): <7 U/L — ABNORMAL LOW (ref 10–49)
ANION GAP: 6 mmol/L (ref 5–14)
AST (SGOT): 10 U/L (ref <=34)
BILIRUBIN TOTAL: 0.5 mg/dL (ref 0.3–1.2)
BLOOD UREA NITROGEN: 35 mg/dL — ABNORMAL HIGH (ref 9–23)
BUN / CREAT RATIO: 17
CALCIUM: 10.3 mg/dL (ref 8.7–10.4)
CHLORIDE: 109 mmol/L — ABNORMAL HIGH (ref 98–107)
CO2: 23.7 mmol/L (ref 20.0–31.0)
CREATININE: 2.01 mg/dL — ABNORMAL HIGH
EGFR CKD-EPI AA MALE: 37 mL/min/{1.73_m2} — ABNORMAL LOW (ref >=60)
EGFR CKD-EPI NON-AA MALE: 32 mL/min/{1.73_m2} — ABNORMAL LOW (ref >=60)
GLUCOSE RANDOM: 120 mg/dL (ref 70–179)
POTASSIUM: 5.2 mmol/L — ABNORMAL HIGH (ref 3.4–4.5)
PROTEIN TOTAL: 6.3 g/dL (ref 5.7–8.2)
SODIUM: 139 mmol/L (ref 135–145)

## 2020-01-20 LAB — BILIRUBIN, DIRECT: BILIRUBIN DIRECT: 0.2 mg/dL (ref 0.00–0.30)

## 2020-01-20 LAB — GAMMA GT: GAMMA GLUTAMYL TRANSFERASE: 13 U/L

## 2020-01-21 LAB — CYCLOSPORINE, TROUGH: CYCLOSPORINE, TROUGH: 64 ng/mL — ABNORMAL LOW (ref 100–400)

## 2020-01-22 ENCOUNTER — Ambulatory Visit
Admit: 2020-01-22 | Discharge: 2020-01-23 | Payer: MEDICARE | Attending: Student in an Organized Health Care Education/Training Program | Primary: Student in an Organized Health Care Education/Training Program

## 2020-01-22 DIAGNOSIS — R29898 Other symptoms and signs involving the musculoskeletal system: Principal | ICD-10-CM

## 2020-01-22 DIAGNOSIS — G6289 Other specified polyneuropathies: Principal | ICD-10-CM

## 2020-01-22 MED ORDER — DULOXETINE 30 MG CAPSULE,DELAYED RELEASE
ORAL_CAPSULE | Freq: Every evening | ORAL | 11 refills | 30.00000 days | Status: CP
Start: 2020-01-22 — End: 2021-01-21
  Filled 2020-01-30: qty 30, 30d supply, fill #0

## 2020-01-22 NOTE — Unmapped (Addendum)
Outpatient Neurology Consult Note     MMNT [address]  [facility] [address] [place]  [address]  [place] [address]  [phone]    Date: 01/22/2020  Patient Name: Jeffrey Ward  MRN: [id number]  PCP: Jeffrey Rung, MD     Assessment and Plan        Jeffrey Ward is a 69 y.o. male with a past medical history of multilevel lower spine compression fractures s/p repair (1990), right hip fracture, hep C s/p liver transplant, DM, HTN, CKD, chronic LS radiculopathy, afib, L CTS presenting in consultation for evaluation of neuropathy.    Multifactorial peripheral neuropathy: Multiple risk factors for neuropathy including previous critical illness, diabetes, hepatitis C s/p liver transplant, amiodarone therapy. Has tried Lyrica (ineffective, caused dizziness) and gabapentin (ineffective). Has taken Cymbalta many years ago but not sure if it helped.   - Continue Lyrica 75 mg BID  - Start Cymbalta 30 mg nightly  - Patient will let me know how he is doing on Cymbalta in 6-8 weeks (can consider increasing)  - Could consider switching amiodarone if possible  - Encouraged trial of voltaren gel OTC or aspirin soak foot baths at night  - Offered referral for spinal cord stimulator (patient refused)  - Could consider low dose naltrexone at next visit if no improvement  - Follow up in 4 months    Reviewed the differential diagnosis, plan of care in detail, as well as other elements as detailed above.  A written summary was provided to the patient, including a current medication and allergy list, problem list and plan of care. In addition, my contact information was provided to the patient, in writing.  All the patient's questions and concerns were addressed to their stated satisfaction .      Return Visit Discussed: Return in about 4 months (around 05/21/2020).    This patient was seen and discussed with Jeffrey Ward who agrees with the above assessment and plan.    I personally spent 70 minutes face-to-face and non-face-to-face in the care of this patient, which includes all pre, intra, and post visit time on the date of service.    Jeffrey Corns MD  Neurology PGY-3    ATTESTATION NOTE:  I saw and evaluated the patient, participating in the key portions of the service.  I reviewed the resident???s note and agree with the resident???s findings and plan as documented in their note.    Jeffrey Rushing, MD  Associate Professor  Department of Neurology  [facility]         HPI         HPI: Jeffrey Ward is a 69 y.o. male seen in consultation at the [facility] at the request of Jeffrey Ward for evaluation of neuropathy. They are requesting my opinion regarding potential etiologies, diagnostic work-up that should be considered, and/or treatment options entertained.     Jeffrey Ward endorses history of neuropathy. Notes electric shocks in the toes, soles, and ankles. Occasionally notes pain in hands. Symptoms much worse at night. Saw Jeffrey Ward in 2019. Continues to take Lyrica 75 mg BID. Saw chronic pain center in [place] where he got TENS unit and phototherapy which was helpful. Has not tried any other medications other than lidocaine gel (not helpful).     Current treatments: Lyrica 75 mg BID (not effective)  Previous treatments: Lyrica 100 mg BID (  caused dizziness), gabapentin (ineffective), has been on Cymbalta previously but not sure about efficacy, tried TENS unit and phototherapy at pain management in [place] (effective but too expensive), lidocaine gel (ineffective)    Pertinent imaging/workup:  MRI lumbar spine 02/12/2018:   --Extensive postoperative changes as above with old stable compression fracture at T12.  --Stable grade 2 anterolisthesis of L5 on S1 with associated mild left foraminal and central canal/subarticular zone stenosis.  --Poor visualization at the level of L2-3 as above secondary to metal artifact.  EMG/NCS 12/26/2016:  Abnormal study. These electrodiagnostic findings show evidence of a mild   axonal sensorimotor polyneuropathy, a severe left median neuropathy at the   wrist, and a probable superimposed left lumbosacral radiculopathy.     Pertinent surgical/medical history:  T12 compression fracture s/p repair (1990)  Right hip fracture ~1 year ago  Hepatitis C s/p liver transplant 2018  DM (diagnosed a few years ago)  HTN  LS radiculopathy  CKD  Afib    Social history:  Denies alcohol  Denies tobacco  Denies illicit/rec drugs  Retired, Art gallery manager    No Known Allergies     Current Outpatient Medications   Medication Sig Dispense Refill   ??? alendronate (FOSAMAX) 70 MG tablet Take 1 tablet (70 mg total) by mouth every seven (7) days. 4 tablet 11   ??? amiodarone (PACERONE) 200 MG tablet Take 200 mg by mouth daily.     ??? atorvastatin (LIPITOR) 20 MG tablet Take 20 mg by mouth daily.     ??? bisacodyl (DULCOLAX, BISACODYL,) 5 mg EC tablet Take 5 mg by mouth daily as needed.      ??? blood-glucose meter (GLUCOSE MONITORING KIT) kit Use as instructed 1 each 0   ??? chlorthalidone (HYGROTON) 25 MG tablet Take 1/2 tablet (12.5 mg total) by mouth every morning. 30 tablet 2   ??? cholecalciferol, vitamin D3, 125 mcg (5,000 unit) tablet Take 125 mcg by mouth daily.     ??? cycloSPORINE modified (NEORAL) 100 MG capsule Take 2 capsules (200 mg total) by mouth daily AND 1 capsule (100 mg total) nightly. For a total dose of 200 mg in the morning 175mg  every evening. 270 capsule 3   ??? cycloSPORINE modified (NEORAL) 25 MG capsule Take 3 capsules (75 mg total) by mouth nightly. For a total dose of 200 mg in the morning 175mg  every evening 270 capsule 3   ??? insulin lispro (HUMALOG KWIKPEN INSULIN) 100 unit/mL injection pen Inject 4 Units under the skin Three (3) times a day before meals. 15 mL 12   ??? magnesium oxide-Mg AA chelate (MAGNESIUM, AMINO ACID CHELATE,) 133 mg Tab Take 2 tablets by mouth Two (2) times a day. 100 tablet PRN   ??? melatonin 3 mg Tab Take 1 tablet (3 mg total) by mouth nightly. 30 tablet 0   ??? mirtazapine (REMERON) 15 MG tablet Take 1 tablet (15 mg total) by mouth nightly. 90 tablet 3   ??? mycophenolate (CELLCEPT) 250 mg capsule Take 2 capsules (500 mg total) by mouth two (2) times a day. 120 capsule 7   ??? pen needle, diabetic (ULTICARE PEN NEEDLE) 32 gauge x 5/32 (4 mm) Ndle USE WITH INSULIN PEN 4 TIMES DAILY AS DIRECTED 100 each 1   ??? predniSONE (DELTASONE) 5 MG tablet Take 4 tablets (20 mg) by mouth for one day, then take 3 tablets (15 mg) the next day, then take 2 tablets (10 mg) the next day, then take 1 tablet (5mg ) then stop  10 tablet 0   ??? pregabalin (LYRICA) 100 MG capsule Take 1 capsule (100 mg total) by mouth 2 (two) times daily 60 capsule 11   ??? pregabalin (LYRICA) 75 MG capsule Take 1 capsule (75 mg total) by mouth Two (2) times a day. 60 capsule 6   ??? rosuvastatin (CRESTOR) 5 MG tablet Take 1 tablet (5 mg total) by mouth once daily 30 tablet 11   ??? sildenafiL, pulm.hypertension, (REVATIO) 20 mg tablet Take 2 tablets (40 mg total) by mouth Three (3) times a day. 180 tablet 5   ??? blood sugar diagnostic Strp Use three (3) times a day before meals. 100 each 7   ??? DULoxetine (CYMBALTA) 30 MG capsule Take 1 capsule (30 mg total) by mouth nightly. 30 capsule 11   ??? glimepiride (AMARYL) 4 MG tablet Take 4 mg by mouth daily.      ??? insulin glargine (BASAGLAR KWIKPEN U-100 INSULIN) 100 unit/mL (3 mL) injection pen Inject 0.12 mL (12 Units total) under the skin nightly. 15 mL 12     No current facility-administered medications for this visit.       Past Medical History:   Diagnosis Date   ??? Atrial fibrillation (CMS-HCC)    ??? Basal cell carcinoma    ??? Cancer (CMS-HCC)    ??? Cirrhosis (CMS-HCC)    ??? Depression    ??? Diabetes (CMS-HCC)    ??? Headache    ??? Hepatitis C 07/17/2012   ??? History of transfusion    ??? Liver disease    ??? Low back pain 07/17/2012   ??? Varices, esophageal (CMS-HCC)        Past Surgical History: Procedure Laterality Date   ??? ANKLE SURGERY     ??? BACK SURGERY     ??? BACK SURGERY     ??? CARDIAC SURGERY      pacemaker   ??? CHG US GUIDE, TISSUE ABLATION N/A 01/22/2016    Procedure: ULTRASOUND GUIDANCE FOR, AND MONITORING OF, PARENCHYMAL TISSUE ABLATION;  Surgeon: Particia Nearing, MD;  Location: MAIN OR [facility];  Service: Transplant   ??? IR EMBOLIZATION ORGAN ISCHEMIA, TUMORS, INFAR  06/16/2016    IR EMBOLIZATION ORGAN ISCHEMIA, TUMORS, INFAR 06/16/2016 Ammie Dalton, MD IMG VIR [facility]   ??? PR COLON CA SCRN NOT HI RSK IND N/A 02/27/2015    Procedure: COLOREC CNCR SCR;COLNSCPY NO;  Surgeon: Vonda Antigua, MD;  Location: GI PROCEDURES MEMORIAL [facility];  Service: Gastroenterology   ??? PR ENDOSCOPIC ULTRASOUND EXAM N/A 02/27/2015    Procedure: UGI ENDO; W/ENDO ULTRASOUND EXAM INCLUDES ESOPHAGUS, STOMACH, &/OR DUODENUM/JEJUNUM;  Surgeon: Vonda Antigua, MD;  Location: GI PROCEDURES MEMORIAL [facility];  Service: Gastroenterology   ??? PR ERCP BALLOON DILATE BILIARY/PANC DUCT/AMPULLA EA N/A 02/10/2017    Procedure: ERCP;WITH TRANS-ENDOSCOPIC BALLOON DILATION OF BILIARY/PANCREATIC DUCT(S) OR OF AMPULLA, INCLUDING SPHINCTERECTOMY, WHEN PERFOREMD,EACH DUCT ([address]);  Surgeon: Mayford Knife, MD;  Location: GI PROCEDURES [facility]ORIAL [facility];  Service: Gastroenterology   ??? PR ERCP REMOVE FOREIGN BODY/STENT BILIARY/PANC DUCT N/A 02/10/2017    Procedure: ENDOSCOPIC RETROGRADE CHOLANGIOPANCREATOGRAPHY (ERCP); W/ REMOVAL OF FOREIGN BODY/STENT FROM BILIARY/PANCREATIC DUCT(S);  Surgeon: Mayford Knife, MD;  Location: GI PROCEDURES MEMORIAL [facility];  Service: Gastroenterology   ??? PR ERCP REMOVE FOREIGN BODY/STENT BILIARY/PANC DUCT N/A 06/29/2017    Procedure: ENDOSCOPIC RETROGRADE CHOLANGIOPANCREATOGRAPHY (ERCP); W/ REMOVAL OF FOREIGN BODY/STENT FROM BILIARY/PANCREATIC DUCT(S);  Surgeon: Vonda Antigua, MD;  Location: GI PROCEDURES MEMORIAL [facility];  Service: Gastroenterology   ??? PR ERCP STENT  PLACEMENT BILIARY/PANCREATIC DUCT N/A 11/29/2016 Procedure: ENDOSCOPIC RETROGRADE CHOLANGIOPANCREATOGRAPHY (ERCP); WITH PLACEMENT OF ENDOSCOPIC STENT INTO BILIARY OR PANCREATIC DUCT;  Surgeon: Chriss Driver, MD;  Location: GI PROCEDURES MEMORIAL [facility];  Service: Gastroenterology   ??? PR ERCP STENT PLACEMENT BILIARY/PANCREATIC DUCT N/A 04/26/2017    Procedure: ENDOSCOPIC RETROGRADE CHOLANGIOPANCREATOGRAPHY (ERCP); WITH PLACEMENT OF ENDOSCOPIC STENT INTO BILIARY OR PANCREATIC DUCT;  Surgeon: Mayford Knife, MD;  Location: GI PROCEDURES MEMORIAL [facility];  Service: Gastroenterology   ??? PR ERCP,W/REMOVAL STONE,BIL/PANCR DUCTS N/A 11/29/2016    Procedure: ERCP; W/ENDOSCOPIC RETROGRADE REMOVAL OF CALCULUS/CALCULI FROM BILIARY &/OR PANCREATIC DUCTS;  Surgeon: Chriss Driver, MD;  Location: GI PROCEDURES MEMORIAL [facility];  Service: Gastroenterology   ??? PR ERCP,W/REMOVAL STONE,BIL/PANCR DUCTS N/A 02/10/2017    Procedure: ERCP; W/ENDOSCOPIC RETROGRADE REMOVAL OF CALCULUS/CALCULI FROM BILIARY &/OR PANCREATIC DUCTS;  Surgeon: Mayford Knife, MD;  Location: GI PROCEDURES MEMORIAL [facility];  Service: Gastroenterology   ??? PR ERCP,W/REMOVAL STONE,BIL/PANCR DUCTS N/A 04/26/2017    Procedure: ERCP; W/ENDOSCOPIC RETROGRADE REMOVAL OF CALCULUS/CALCULI FROM BILIARY &/OR PANCREATIC DUCTS;  Surgeon: Mayford Knife, MD;  Location: GI PROCEDURES MEMORIAL [facility];  Service: Gastroenterology   ??? PR ERCP,W/REMOVAL STONE,BIL/PANCR DUCTS N/A 06/29/2017    Procedure: ERCP; W/ENDOSCOPIC RETROGRADE REMOVAL OF CALCULUS/CALCULI FROM BILIARY &/OR PANCREATIC DUCTS;  Surgeon: Vonda Antigua, MD;  Location: GI PROCEDURES MEMORIAL [facility];  Service: Gastroenterology   ??? PR ESOPHAGOSCP RIG TRANSORAL HYPOPHARYNX CRV ESOPH N/A 04/04/2018    Procedure: ESOPHAGOSCOPY, RIGID, TRANSORAL WITH DIVERTICULECTOMY OF HYPOPHARYNX OR CERVICAL ESOPHAGUS, WITH CRICOPHARYNGEAL MYOTOMY, INCLUDES USE OF TELESCOPE OR OPERATING MICROSCOPE AND REPAIR, WHEN PERFORMED;  Surgeon: Vista Deck, MD;  Location: MAIN OR [facility] Service: ENT   ??? PR INSER HEART TEMP PACER ONE CHMBR N/A 10/02/2016    Procedure: Tempoarary Pacemaker Insertion;  Surgeon: Meredith Leeds, MD;  Location: [facility];  Service: Cardiology   ??? PR LAP,DIAGNOSTIC ABDOMEN N/A 01/22/2016    Procedure: Laparoscopy, Abdomen, Peritoneum, & Omentum, Diagnostic, W/Wo Collection Specimen(S) By Brushing Or Washing;  Surgeon: Particia Nearing, MD;  Location: MAIN OR [facility];  Service: Transplant   ??? PR LARYNGOPLASTY MEDIALIZATION UNLIATERAL N/A 11/07/2018    Procedure: R31 LARYNGOPLASTY, MEDIALIZATION, UNILATERAL;  Surgeon: Vista Deck, MD;  Location: MAIN OR [facility];  Service: ENT   ??? PR PLACE PERCUT GASTROSTOMY TUBE N/A 11/17/2016    Procedure: UGI ENDO; W/DIRECTED PLCMT PERQ GASTROSTOMY TUBE;  Surgeon: Cletis Athens, MD;  Location: GI PROCEDURES MEMORIAL [facility];  Service: Gastroenterology   ??? PR TRACHEOSTOMY, PLANNED N/A 09/29/2016    Procedure: TRACHEOSTOMY PLANNED (SEPART PROC);  Surgeon: Katherina Mires, MD;  Location: MAIN OR [facility];  Service: Trauma   ??? PR TRANSCATH INSERT OR REPLACE LEADLESS PM VENTR N/A 10/11/2016    Procedure: Pacemaker Implant/Replace Leadless;  Surgeon: Meredith Leeds, MD;  Location: [facility];  Service: Cardiology   ??? PR TRANSPLANT LIVER,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    Procedure: Liver Allotransplantation; Orthotopic, Partial Or Whole, From Cadaver Or Living Donor, Any Age;  Surgeon: Doyce Loose, MD;  Location: MAIN OR [facility];  Service: Transplant   ??? PR TRANSPLANT,PREP DONOR LIVER, WHOLE N/A 09/15/2016    Procedure: Rogelia Boga Std Prep Cad Donor Whole Liver Gft Prior Tnsplnt,Inc Chole,Diss/Rem Surr Tissu Wo Triseg/Lobe Splt;  Surgeon: Doyce Loose, MD;  Location: MAIN OR [facility];  Service: Transplant   ??? PR UPPER GI ENDOSCOPY,BIOPSY N/A 07/17/2012    Procedure: UGI ENDOSCOPY; WITH BIOPSY, SINGLE OR MULTIPLE;  Surgeon: Alba Destine, MD;  Location:  GI PROCEDURES MEMORIAL [facility];  Service: Gastroenterology   ??? PR UPPER GI ENDOSCOPY,DIAGNOSIS N/A 02/04/2014    Procedure: UGI ENDO, INCLUDE ESOPHAGUS, STOMACH, & DUODENUM &/OR JEJUNUM; DX W/WO COLLECTION SPECIMN, BY BRUSH OR WASH;  Surgeon: Wilburt Finlay, MD;  Location: GI PROCEDURES MEMORIAL [facility];  Service: Gastroenterology   ??? PR UPPER GI ENDOSCOPY,LIGAT VARIX N/A 11/05/2013    Procedure: UGI ENDO; W/BAND LIG ESOPH &/OR GASTRIC VARICES;  Surgeon: Wilburt Finlay, MD;  Location: GI PROCEDURES MEMORIAL [facility];  Service: Gastroenterology       Social History     Socioeconomic History   ??? Marital status: Married     Spouse name: None   ??? Number of children: None   ??? Years of education: None   ??? Highest education level: None   Occupational History   ??? None   Tobacco Use   ??? Smoking status: Never Smoker   ??? Smokeless tobacco: Never Used   ??? Tobacco comment: Smoked in high school for about 5 years.    Substance and Sexual Activity   ??? Alcohol use: No   ??? Drug use: No   ??? Sexual activity: Yes     Partners: Female     Birth control/protection: None   Other Topics Concern   ??? Do you use sunscreen? Not Asked   ??? Tanning bed use? Not Asked   ??? Are you easily burned? Not Asked   ??? Excessive sun exposure? Not Asked   ??? Blistering sunburns? Not Asked   ??? Exercise No   ??? Living Situation No   Social History Narrative   ??? None     Social Determinants of Health     Financial Resource Strain: Not on file   Food Insecurity: Not on file   Transportation Needs: Not on file   Physical Activity: Not on file   Stress: Not on file   Social Connections: Not on file       Family History   Problem Relation Age of Onset   ??? Hypertension Mother    ??? Cancer Mother    ??? Heart disease Mother    ??? Heart disease Father    ??? Cirrhosis Neg Hx    ??? Liver cancer Neg Hx    ??? Anemia Neg Hx    ??? Diabetes Neg Hx    ??? Kidney disease Neg Hx    ??? Obesity Neg Hx    ??? Thyroid disease Neg Hx    ??? Osteoporosis Neg Hx    ??? Coronary artery disease Neg Hx    ??? Anesthesia problems Neg Hx    ??? Basal cell carcinoma Neg Hx    ??? Squamous cell carcinoma Neg Hx         Review of Systems     A 10-system review of systems was conducted and was negative except as documented above in the HPI.       Objective        Vital signs: BP 123/69 (BP Site: R Arm, BP Position: Sitting, BP Cuff Size: Medium) Comment (BP Cuff Size): long - Pulse 68  - Ht 172.7 cm (5' 7.99)  - Wt 86.2 kg (190 lb)  - BMI 28.90 kg/m??      Physical Exam:  General Appearance:Chronically ill appearing. In no acute distress. Overweight.  HEENT: Head is atraumatic and normocephalic. Sclera anicteric without injection. Oropharyngeal membranes are moist with no erythema or exudate.  Neck: Supple.  Lungs: No increased work of  breathing  Heart: Regular rate and rhythm.  Abdomen: Soft, nontender, nondistended.  Extremities: Mild pitting edema of the BLE.    Neurological Examination:     Mental Status: Alert, conversant, able to follow conversation and interview. Spontaneous speech was fluent without word finding pauses, dysarthria, or paraphasic errors. Comprehension was intact to simple and multi-step commands. Memory for recent and remote events was intact.    Cranial Nerves: PERRL. Pursuit eye movements were uninterrupted with full range and without more than end-gaze nystagmus. Facial sensation intact bilaterally to light touch on the forehead, cheek, and chin. Face symmetric at rest. Normal facial movement bilaterally, including forehead, eye closure and grimace/smile. Hearing intact to conversation. Shoulder shrug full strength bilaterally. Palate movement is symmetric. Tongue protrudes midline and tongue movements are normal.    Motor Exam: Normal bulk.  No tremors, myoclonus, or other adventitious movement.  5/5 BUE  4-/5 R hip flexors (pain limited, prior hip surgery), 4+/5 L hip flexors, 5/5 bilateral knee flexors/extensors, 5/5 dorsi/plantarflexion, 0/5 R EHL (chronic), 5/5 L EHL      Reflexes:   R L   Biceps +2 +2   Brachioradialis +2 +2   Triceps +2 +2   Patella +2 +2   Achilles +2 +2 Toes are downgoing bilaterally.    Sensory: Diminished cold temperature sensation from the mid-shin. Absent vibratory sensation at the toes. Diminished vibratory sensation at the ankles. Impaired proprioception at the right toes, preserved on the left.    Cerebellar/Coordination/Gait: Finger-to-nose is normal without ataxia or dysmetria bilaterally. Heel-to-shin is normal without ataxia or dysmetria bilaterally. Gait exam demonstrates antalgic gait favoring right leg. Romberg positive.         Diagnostic Studies and Review of Records   See HPI

## 2020-01-22 NOTE — Unmapped (Addendum)
You were evaluated in the [facility] neurology clinic for neuropathy. We will start you on Cymbalta 30 mg nightly for your pain. It can takes up to 8 weeks for full effect. You can also try Voltaren gel which you can get from any pharmacy. You can also try saltwater baths for your feet which may help with your pain. Please send me a message in 6-8 weeks and let me know how you are doing with the medication. We will have you follow up in 4 months.

## 2020-01-23 NOTE — Unmapped (Signed)
Addended by: Jamelle Rushing E on: 01/23/2020 01:58 PM     Modules accepted: Level of Service

## 2020-01-30 MED FILL — DULOXETINE 30 MG CAPSULE,DELAYED RELEASE: 30 days supply | Qty: 30 | Fill #0 | Status: AC

## 2020-02-04 MED ORDER — ALENDRONATE 70 MG TABLET
ORAL_TABLET | ORAL | 11 refills | 28 days
Start: 2020-02-04 — End: 2021-02-03

## 2020-02-04 NOTE — Unmapped (Signed)
[facility] Refill Coordination Note    Specialty Medication(s) to be Shipped:   Transplant: mycophenolate mofetil 250mg , cyclosporine 25mg  and cyclosporine 100mg     Other medication(s) to be shipped: alendronate, chlorthalidone, magnesium, mirtazipine, rosuvastatin and sildenafil     Jeffrey Ward, DOB: [birth year]  Phone: [phone] (home) [phone] (work)      All above HIPAA information was verified with patient.     Was a Nurse, learning disability used for this call? No    Completed refill call assessment today to schedule patient's medication shipment from the [facility] [phone]).       Specialty medication(s) and dose(s) confirmed: Regimen is correct and unchanged.   Changes to medications: [name] reports no changes at this time.  Changes to insurance: No  Questions for the pharmacist: No    Confirmed patient received Welcome Packet with first shipment. The patient will receive a drug information handout for each medication shipped and additional FDA Medication Guides as required.       DISEASE/MEDICATION-SPECIFIC INFORMATION        N/A    SPECIALTY MEDICATION ADHERENCE     Medication Adherence    Patient reported X missed doses in the last month: 1  Specialty Medication: Cyclosporine 25mg   Patient is on additional specialty medications: Yes  Additional Specialty Medications: Cyclosporine 100mg   Patient Reported Additional Medication X Missed Doses in the Last Month: 1  Patient is on more than two specialty medications: Yes  Specialty Medication: Mycophenolate 250mg   Patient Reported Additional Medication X Missed Doses in the Last Month: 1  Support network for adherence: family member        Cyclosporine 25 mg: 8 days of medicine on hand   Cyclosporine 100 mg: 8 days of medicine on hand   Mycophneolate 250 mg: 8 days of medicine on hand     SHIPPING     Shipping address confirmed in Epic.     Delivery Scheduled: Yes, Expected medication delivery date: 02/11/2020.     Medication will be delivered via Next Day Courier to the prescription address in Epic WAM.    [name]   [facility] Specialty Technician

## 2020-02-05 MED ORDER — ALENDRONATE 70 MG TABLET
ORAL_TABLET | ORAL | 11 refills | 28.00000 days
Start: 2020-02-05 — End: 2021-02-04

## 2020-02-10 MED FILL — ALENDRONATE 70 MG TABLET: ORAL | 28 days supply | Qty: 4 | Fill #0

## 2020-02-10 MED FILL — CYCLOSPORINE MODIFIED 25 MG CAPSULE: 30 days supply | Qty: 90 | Fill #8 | Status: AC

## 2020-02-10 MED FILL — CYCLOSPORINE MODIFIED 25 MG CAPSULE: ORAL | 30 days supply | Qty: 90 | Fill #8

## 2020-02-10 MED FILL — CHLORTHALIDONE 25 MG TABLET: ORAL | 60 days supply | Qty: 30 | Fill #1

## 2020-02-10 MED FILL — MIRTAZAPINE 15 MG TABLET: 90 days supply | Qty: 90 | Fill #2 | Status: AC

## 2020-02-10 MED FILL — ROSUVASTATIN 5 MG TABLET: 30 days supply | Qty: 30 | Fill #6

## 2020-02-10 MED FILL — MIRTAZAPINE 15 MG TABLET: ORAL | 90 days supply | Qty: 90 | Fill #2

## 2020-02-10 MED FILL — MYCOPHENOLATE MOFETIL 250 MG CAPSULE: 30 days supply | Qty: 120 | Fill #6 | Status: AC

## 2020-02-10 MED FILL — SILDENAFIL (PULMONARY HYPERTENSION) 20 MG TABLET: 30 days supply | Qty: 180 | Fill #1 | Status: AC

## 2020-02-10 MED FILL — CHLORTHALIDONE 25 MG TABLET: 60 days supply | Qty: 30 | Fill #1 | Status: AC

## 2020-02-10 MED FILL — MG-PLUS-PROTEIN 133 MG TABLET: 25 days supply | Qty: 100 | Fill #9 | Status: AC

## 2020-02-10 MED FILL — MG-PLUS-PROTEIN 133 MG TABLET: ORAL | 25 days supply | Qty: 100 | Fill #9

## 2020-02-10 MED FILL — MYCOPHENOLATE MOFETIL 250 MG CAPSULE: ORAL | 30 days supply | Qty: 120 | Fill #6

## 2020-02-10 MED FILL — CYCLOSPORINE MODIFIED 100 MG CAPSULE: 30 days supply | Qty: 90 | Fill #8 | Status: AC

## 2020-02-10 MED FILL — ALENDRONATE 70 MG TABLET: 28 days supply | Qty: 4 | Fill #0 | Status: AC

## 2020-02-10 MED FILL — CYCLOSPORINE MODIFIED 100 MG CAPSULE: ORAL | 30 days supply | Qty: 90 | Fill #8

## 2020-02-10 MED FILL — ROSUVASTATIN 5 MG TABLET: 30 days supply | Qty: 30 | Fill #6 | Status: AC

## 2020-02-10 MED FILL — SILDENAFIL (PULMONARY HYPERTENSION) 20 MG TABLET: ORAL | 30 days supply | Qty: 180 | Fill #1

## 2020-03-15 MED ORDER — MG-PLUS-PROTEIN 133 MG TABLET
ORAL_TABLET | Freq: Two times a day (BID) | ORAL | PRN refills | 25 days
Start: 2020-03-15 — End: ?

## 2020-03-16 DIAGNOSIS — Z944 Liver transplant status: Principal | ICD-10-CM

## 2020-03-16 MED ORDER — MG-PLUS-PROTEIN 133 MG TABLET
ORAL_TABLET | Freq: Two times a day (BID) | ORAL | PRN refills | 30.00000 days | Status: CP
Start: 2020-03-16 — End: ?
  Filled 2020-03-17: qty 120, 30d supply, fill #0

## 2020-03-16 NOTE — Unmapped (Signed)
[facility] Refill Coordination Note  Medication: Neoral, Cellcept    Unable to reach patient to schedule shipment for medication being filled at [facility]. Left voicemail on phone.  As this is the 3rd unsuccessful attempt to reach the patient, no additional phone call attempts will be made at this time.      Phone numbers attempted: [phone]  Last scheduled delivery: 02/10/20    Please call the [facility] at [phone] (option [phone]) should you have any further questions.      Thanks,  [facility] Specialty Team

## 2020-03-16 NOTE — Unmapped (Signed)
Pt request for RX Refill    magnesium oxide-Mg AA chelate (MAGNESIUM, AMINO ACID CHELATE,) 133 mg Tab         Sig: Take 2 tablets by mouth Two (2) times a day.    Disp:  100 tablet    Refills:  PRN    Start: 03/15/2020

## 2020-03-17 MED ORDER — SODIUM BICARBONATE 650 MG TABLET
ORAL_TABLET | Freq: Two times a day (BID) | ORAL | 6 refills | 50.00000 days | Status: CN
Start: 2020-03-17 — End: 2021-03-17

## 2020-03-17 MED FILL — CYCLOSPORINE MODIFIED 100 MG CAPSULE: ORAL | 30 days supply | Qty: 90 | Fill #9

## 2020-03-17 MED FILL — DULOXETINE 30 MG CAPSULE,DELAYED RELEASE: 30 days supply | Qty: 30 | Fill #0 | Status: AC

## 2020-03-17 MED FILL — CYCLOSPORINE MODIFIED 25 MG CAPSULE: ORAL | 30 days supply | Qty: 90 | Fill #9

## 2020-03-17 MED FILL — ROSUVASTATIN 5 MG TABLET: 30 days supply | Qty: 30 | Fill #7

## 2020-03-17 MED FILL — DULOXETINE 30 MG CAPSULE,DELAYED RELEASE: ORAL | 30 days supply | Qty: 30 | Fill #0

## 2020-03-17 MED FILL — MYCOPHENOLATE MOFETIL 250 MG CAPSULE: ORAL | 30 days supply | Qty: 120 | Fill #7

## 2020-03-17 MED FILL — CYCLOSPORINE MODIFIED 100 MG CAPSULE: 30 days supply | Qty: 90 | Fill #9 | Status: AC

## 2020-03-17 MED FILL — ROSUVASTATIN 5 MG TABLET: 30 days supply | Qty: 30 | Fill #7 | Status: AC

## 2020-03-17 MED FILL — CYCLOSPORINE MODIFIED 25 MG CAPSULE: 30 days supply | Qty: 90 | Fill #9 | Status: AC

## 2020-03-17 MED FILL — MG-PLUS-PROTEIN 133 MG TABLET: 30 days supply | Qty: 120 | Fill #0 | Status: AC

## 2020-03-17 MED FILL — ALENDRONATE 70 MG TABLET: 28 days supply | Qty: 4 | Fill #1 | Status: AC

## 2020-03-17 MED FILL — ALENDRONATE 70 MG TABLET: ORAL | 28 days supply | Qty: 4 | Fill #1

## 2020-03-17 MED FILL — MYCOPHENOLATE MOFETIL 250 MG CAPSULE: 30 days supply | Qty: 120 | Fill #7 | Status: AC

## 2020-03-17 NOTE — Unmapped (Signed)
[facility] Refill Coordination Note    Specialty Medication(s) to be Shipped:   Transplant: mycophenolate mofetil 250mg , cyclosporine 25mg  and cyclosporine 100mg     Other medication(s) to be shipped: alendronate, duloxetine, magnesium, sodium bicarb, rosuvastatin and sildenafil     Jeffrey Ward, DOB: Jan 16, 1951  Phone: [phone] (home)       All above HIPAA information was verified with patient.     Was a Nurse, learning disability used for this call? No    Completed refill call assessment today to schedule patient's medication shipment from the [facility] [phone]).       Specialty medication(s) and dose(s) confirmed: Regimen is correct and unchanged.   Changes to medications: Jeffrey Ward reports no changes at this time.  Changes to insurance: No  Questions for the pharmacist: No    Confirmed patient received Welcome Packet with first shipment. The patient will receive a drug information handout for each medication shipped and additional FDA Medication Guides as required.       DISEASE/MEDICATION-SPECIFIC INFORMATION        N/A    SPECIALTY MEDICATION ADHERENCE     Medication Adherence    Patient reported X missed doses in the last month: 1  Specialty Medication: Cyclosporine 25mg   Patient is on additional specialty medications: Yes  Additional Specialty Medications: Cyclosporine 100mg   Patient Reported Additional Medication X Missed Doses in the Last Month: 1  Patient is on more than two specialty medications: Yes  Specialty Medication: Mycophenolate 250mg   Patient Reported Additional Medication X Missed Doses in the Last Month: 0  Support network for adherence: family member        Cyclosporine 25 mg: 2 days of medicine on hand   Cyclosporine 100 mg: 2 days of medicine on hand   Mycophenolate 250 mg: 0 days of medicine on hand     SHIPPING     Shipping address confirmed in Epic.     Delivery Scheduled: Yes, Expected medication delivery date: 03/18/2019.     Medication will be delivered via Same Day Courier to the prescription address in Epic WAM.    Jeffrey Ward   [facility] Specialty Technician

## 2020-03-18 MED ORDER — SODIUM BICARBONATE 650 MG TABLET
ORAL_TABLET | Freq: Two times a day (BID) | ORAL | 3 refills | 90 days | Status: CP
Start: 2020-03-18 — End: 2021-03-18
  Filled 2020-03-18: qty 180, 90d supply, fill #0

## 2020-03-18 MED ORDER — PEN NEEDLE, DIABETIC 32 GAUGE X 5/32" (4 MM)
1 refills | 0 days | Status: CN
Start: 2020-03-18 — End: 2021-03-18

## 2020-03-18 MED FILL — SODIUM BICARBONATE 650 MG TABLET: 90 days supply | Qty: 180 | Fill #0 | Status: AC

## 2020-03-18 NOTE — Unmapped (Signed)
Called patient to discuss scheduling liver transplant annual appointments. Appointments scheduled as per patients preferred date and time. Patient stated verbal understanding of all 04/06/20 appointments. Patient said he will check [facility] mychart for appointment details.

## 2020-03-19 MED FILL — SILDENAFIL (PULMONARY HYPERTENSION) 20 MG TABLET: ORAL | 30 days supply | Qty: 180 | Fill #2

## 2020-03-19 MED FILL — SILDENAFIL (PULMONARY HYPERTENSION) 20 MG TABLET: 30 days supply | Qty: 180 | Fill #2 | Status: AC

## 2020-03-25 NOTE — Unmapped (Signed)
Last week (03/16/20) in talking with TPA, had sent a message to Gertie Fey, NP asking if patient needed scans and about his RETREAT score with HCC history for his annual.     Amil Amen responded and included TPA and pt's new coordinator. Per Amil Amen, no scans were necessary and she noted this:  RETREAT Score Calculation     AFP @ time of transplant 0-20 ??(1.91) ?? ??0 point     Microvascular Invasion - NO ?? ?? 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 ?? 0 point     RETREAT score total 0     Post Transplant Surveillance for Hepatocellular Carcinoma ??0: No HCC surveillance required.

## 2020-04-06 ENCOUNTER — Ambulatory Visit: Admit: 2020-04-06 | Discharge: 2020-04-06 | Payer: MEDICARE

## 2020-04-06 DIAGNOSIS — D849 Immunodeficiency, unspecified: Principal | ICD-10-CM

## 2020-04-06 DIAGNOSIS — K769 Liver disease, unspecified: Principal | ICD-10-CM

## 2020-04-06 DIAGNOSIS — Z7185 Encounter for immunization safety counseling: Principal | ICD-10-CM

## 2020-04-06 DIAGNOSIS — Z944 Liver transplant status: Principal | ICD-10-CM

## 2020-04-06 DIAGNOSIS — Z79899 Other long term (current) drug therapy: Principal | ICD-10-CM

## 2020-04-06 LAB — GAMMA GT: GAMMA GLUTAMYL TRANSFERASE: 17 U/L

## 2020-04-06 LAB — CBC W/ AUTO DIFF
BASOPHILS ABSOLUTE COUNT: 0 10*9/L (ref 0.0–0.1)
BASOPHILS RELATIVE PERCENT: 0.3 %
EOSINOPHILS ABSOLUTE COUNT: 0.1 10*9/L (ref 0.0–0.4)
EOSINOPHILS RELATIVE PERCENT: 1 %
HEMATOCRIT: 41.1 % (ref 41.0–53.0)
HEMOGLOBIN: 13.2 g/dL — ABNORMAL LOW (ref 13.5–17.5)
LARGE UNSTAINED CELLS: 2 % (ref 0–4)
LYMPHOCYTES ABSOLUTE COUNT: 1.3 10*9/L — ABNORMAL LOW (ref 1.5–5.0)
LYMPHOCYTES RELATIVE PERCENT: 25.4 %
MEAN CORPUSCULAR HEMOGLOBIN CONC: 32.1 g/dL (ref 31.0–37.0)
MEAN CORPUSCULAR HEMOGLOBIN: 30.4 pg (ref 26.0–34.0)
MEAN CORPUSCULAR VOLUME: 94.7 fL (ref 80.0–100.0)
MEAN PLATELET VOLUME: 10.9 fL — ABNORMAL HIGH (ref 7.0–10.0)
MONOCYTES ABSOLUTE COUNT: 0.4 10*9/L (ref 0.2–0.8)
MONOCYTES RELATIVE PERCENT: 7.1 %
NEUTROPHILS ABSOLUTE COUNT: 3.2 10*9/L (ref 2.0–7.5)
NEUTROPHILS RELATIVE PERCENT: 64.6 %
PLATELET COUNT: 96 10*9/L — ABNORMAL LOW (ref 150–440)
RED BLOOD CELL COUNT: 4.34 10*12/L — ABNORMAL LOW (ref 4.50–5.90)
RED CELL DISTRIBUTION WIDTH: 13.9 % (ref 12.0–15.0)
WBC ADJUSTED: 5 10*9/L (ref 4.5–11.0)

## 2020-04-06 LAB — COMPREHENSIVE METABOLIC PANEL
ALBUMIN: 4 g/dL (ref 3.4–5.0)
ALKALINE PHOSPHATASE: 84 U/L (ref 46–116)
ALT (SGPT): 11 U/L (ref 10–49)
ANION GAP: 9 mmol/L (ref 5–14)
AST (SGOT): 13 U/L (ref <=34)
BILIRUBIN TOTAL: 0.7 mg/dL (ref 0.3–1.2)
BLOOD UREA NITROGEN: 31 mg/dL — ABNORMAL HIGH (ref 9–23)
BUN / CREAT RATIO: 14
CALCIUM: 10.4 mg/dL (ref 8.7–10.4)
CHLORIDE: 107 mmol/L (ref 98–107)
CO2: 22 mmol/L (ref 20.0–31.0)
CREATININE: 2.14 mg/dL — ABNORMAL HIGH
EGFR CKD-EPI AA MALE: 35 mL/min/{1.73_m2} — ABNORMAL LOW (ref >=60)
EGFR CKD-EPI NON-AA MALE: 30 mL/min/{1.73_m2} — ABNORMAL LOW (ref >=60)
GLUCOSE RANDOM: 157 mg/dL — ABNORMAL HIGH (ref 70–99)
POTASSIUM: 4.4 mmol/L (ref 3.4–4.5)
PROTEIN TOTAL: 6.7 g/dL (ref 5.7–8.2)
SODIUM: 138 mmol/L (ref 135–145)

## 2020-04-06 LAB — CYCLOSPORINE, TROUGH: CYCLOSPORINE, TROUGH: 33 ng/mL — ABNORMAL LOW (ref 100–400)

## 2020-04-06 LAB — PHOSPHORUS: PHOSPHORUS: 2.8 mg/dL (ref 2.4–5.1)

## 2020-04-06 LAB — BILIRUBIN, DIRECT: BILIRUBIN DIRECT: 0.3 mg/dL (ref 0.00–0.30)

## 2020-04-06 LAB — MAGNESIUM: MAGNESIUM: 1.8 mg/dL (ref 1.6–2.6)

## 2020-04-06 NOTE — Unmapped (Signed)
FOLLOW UP ANNUAL LIVER CLINIC NOTE     Patient Name: Jeffrey Ward  Medical Record Number: [id number]     Referring Physician: [name]   Current complaint: Follow up Annual Liver    Assessment/Plan:     Jeffrey Ward is a 70 y.o. 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  Recent imaging stable with continued abscess improvement, no HCC. Patient has been taking 200mg /175mg  of cyclosporine and 500mg  BID of cellcept; immunosuppressive level goal range (50-100).       Today isn't a true trough, and Jeffrey Ward very confused about his med list. Coordinator, Jeffrey Ward, to follow up and reconcile.   He also has been taking 10mg  daily of prenisone, which appears to have only been a temporary therapy. Will decrease to 5mg  x 1 week and then discontinue with labs in 2 weeks. Would then like to further decrease MMF to 250mg  BID if LFTs stable.    RETREAT Score Calculation   AFP @ time of transplant 0-20 ??(1.91) ?? ??0 point     Microvascular Invasion - NO ?? ?? 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 ?? 0 point     RETREAT score total 0     Post Transplant Surveillance for Hepatocellular Carcinoma ??0: No HCC surveillance required.     HEALTH MAINTENANCE:   - Dermatology: This patient is at increased risk for the development of skin cancers due to immunosuppressant medications. We recommend yearly surveillance.  - Colonoscopy: Until age 76, we recommend routine surveillance.   - Dental: no issue  - Mental health: now caregiver for his wife with GYN cancer diagnoses  Immunization History   Administered Date(s) Administered   ??? INFLUENZA TIV (TRI) 57MO+ W/ PRESERV (IM) 01/07/2014   ??? Influenza Vaccine Quad (IIV4 PF) 110mo+ injectable 12/23/2015, 01/12/2017, 12/28/2017   ??? Influenza Virus Vaccine, unspecified formulation 01/07/2014, 12/28/2017   ??? PNEUMOCOCCAL POLYSACCHARIDE 23 01/20/2011, 03/01/2017   ??? TdaP 01/12/2009, 05/17/2012   ??? ZOSTAVAX - ZOSTER VACCINE, LIVE, SQ 05/17/2012     Provided significant vaccine counseling, including risk of death from COVID or influenza vaccine. Patient refuses vaccines today including COVID, shingrix, and influenza vaccine.     Reeducated patient on the importance of preventing illness, infection, and opportunistic cancers and the rationale behind vaccines and routine imaging, given his status as an immunocompromised person.     Return to clinic: 1 year  Labs: 2 weeks to ensure stability then transition to Q3 months.    I personally spent 25 minutes face-to-face and non-face-to-face in the care of this patient, which includes all pre, intra, and post visit time on the date of service. Greater than 50% of the time was spent on counseling and the substance of the discussion.    Subjective:     HPI: Jeffrey Ward is a 70 y.o. male  who underwent liver transplant on 09/15/2016 for HCV cirrhosis and HCC  c/b multiple medical problems post-operatively, including hepatic lesions inevitably determined to be abscesses s/p antibiotic therapy and broken hip s/p surgical repair. He additionally, given his multiple intubations, presumed traumatic, he suffered a right vocal fold paralysis and underwent 03/2018 endoscopic CO2 laser and injection for breathy voice and glottal insufficiency with minimal improvement to symptoms.     He is established with cardiology for cholesterol and afib management, on amiodarone.    Denies fever, chills, weight loss/gain, and fatigue. Denies chest pain, SOB, N/V/D, constipation or abdominal pain. No acute complaint today.      Past Medical History:   Diagnosis Date   ??? Atrial fibrillation (CMS-HCC)    ??? Basal cell carcinoma    ??? Cancer (CMS-HCC)    ??? Cirrhosis (CMS-HCC)    ??? Depression    ??? Diabetes (CMS-HCC)    ??? Headache    ??? Hepatitis C 07/17/2012   ??? History of transfusion    ??? Liver disease    ??? Low back pain 07/17/2012   ??? Varices, esophageal (CMS-HCC)        Past Surgical History:   Procedure Laterality Date   ??? ANKLE SURGERY     ??? BACK SURGERY     ??? BACK SURGERY     ??? CARDIAC SURGERY      pacemaker   ??? CHG US GUIDE, TISSUE ABLATION N/A 01/22/2016    Procedure: ULTRASOUND GUIDANCE FOR, AND MONITORING OF, PARENCHYMAL TISSUE ABLATION;  Surgeon: Particia Nearing, MD;  Location: MAIN OR [facility];  Service: Transplant   ??? IR EMBOLIZATION ORGAN ISCHEMIA, TUMORS, INFAR  06/16/2016    IR EMBOLIZATION ORGAN ISCHEMIA, TUMORS, INFAR 06/16/2016 Ammie Dalton, MD IMG VIR [facility]   ??? PR COLON CA SCRN NOT HI RSK IND N/A 02/27/2015    Procedure: COLOREC CNCR SCR;COLNSCPY NO;  Surgeon: Vonda Antigua, MD;  Location: GI PROCEDURES MEMORIAL [facility];  Service: Gastroenterology   ??? PR ENDOSCOPIC ULTRASOUND EXAM N/A 02/27/2015    Procedure: UGI ENDO; W/ENDO ULTRASOUND EXAM INCLUDES ESOPHAGUS, STOMACH, &/OR DUODENUM/JEJUNUM;  Surgeon: Vonda Antigua, MD;  Location: GI PROCEDURES [facility]  Service: Gastroenterology   ??? PR ERCP BALLOON DILATE BILIARY/PANC DUCT/AMPULLA EA N/A 02/10/2017    Procedure: ERCP;WITH TRANS-ENDOSCOPIC BALLOON DILATION OF BILIARY/PANCREATIC DUCT(S) OR OF AMPULLA, INCLUDING SPHINCTERECTOMY, WHEN PERFOREMD,EACH DUCT (29562);  Surgeon: Mayford Knife, MD;  Location: GI PROCEDURES MEMOR[facility];  Service: Gastroenterology   ??? PR ERCP REMOVE FOREIGN BODY/STENT BILIARY/PANC DUCT N/A 02/10/2017    Procedure: ENDOSCOPIC RETROGRADE CHOLANGIOPANCREATOGRAPHY (ERCP); W/ REMOVAL OF FOREIGN BODY/STENT FROM BILIARY/PANCREATIC DUCT(S);  Surgeon: Mayford Knife, MD;  Location: GI PROCEDURES MEMORIAL [facility];  Service: Gastroenterology   ??? PR ERCP REMOVE FOREIGN BODY/STENT BILIARY/PANC DUCT N/A 06/29/2017    Procedure: ENDOSCOPIC RETROGRADE CHOLANGIOPANCREATOGRAPHY (ERCP); W/ REMOVAL OF FOREIGN BODY/STENT FROM BILIARY/PANCREATIC DUCT(S);  Surgeon: Vonda Antigua, MD;  Location: GI PROCEDURES [facility];  Service: Gastroenterology   ??? PR ERCP STENT PLACEMENT BILIARY/PANCREATIC DUCT N/A 11/29/2016    Procedure: ENDOSCOPIC RETROGRADE CHOLANGIOPANCREATOGRAPHY (ERCP); WITH PLACEMENT OF ENDOSCOPIC STENT INTO BILIARY OR PANCREATIC DUCT;  Surgeon: Chriss Driver, MD;  Location: GI PROCEDURES [facility];  Service: Gastroenterology   ??? PR ERCP STENT PLACEMENT BILIARY/PANCREATIC DUCT N/A 04/26/2017    Procedure: ENDOSCOPIC RETROGRADE CHOLANGIOPANCREATOGRAPHY (ERCP); WITH PLACEMENT OF ENDOSCOPIC STENT INTO BILIARY OR PANCREATIC DUCT;  Surgeon: Mayford Knife, MD;  Location: GI PROCEDURES [facility];  Service: Gastroenterology   ???  PR ERCP,W/REMOVAL STONE,BIL/PANCR DUCTS N/A 11/29/2016    Procedure: ERCP; W/ENDOSCOPIC RETROGRADE REMOVAL OF CALCULUS/CALCULI FROM BILIARY &/OR PANCREATIC DUCTS;  Surgeon: Chriss Driver, MD;  Location: GI PROCEDURES MEMOR[facility];  Service: Gastroenterology   ??? PR ERCP,W/REMOVAL STONE,BIL/PANCR DUCTS N/A 02/10/2017    Procedure: ERCP; W/ENDOSCOPIC RETROGRADE REMOVAL OF CALCULUS/CALCULI FROM BILIARY &/OR PANCREATIC DUCTS;  Surgeon: Mayford Knife, MD;  Location: GI PROCEDURES MEMORIAL [facility];  Service: Gastroenterology   ??? PR ERCP,W/REMOVAL STONE,BIL/PANCR DUCTS N/A 04/26/2017    Procedure: ERCP; W/ENDOSCOPIC RETROGRADE REMOVAL OF CALCULUS/CALCULI FROM BILIARY &/OR PANCREATIC DUCTS;  Surgeon: Mayford Knife, MD;  Location: GI PROCEDURES [facility];  Service: Gastroenterology   ??? PR ERCP,W/REMOVAL STONE,BIL/PANCR DUCTS N/A 06/29/2017    Procedure: ERCP; W/ENDOSCOPIC RETROGRADE REMOVAL OF CALCULUS/CALCULI FROM BILIARY &/OR PANCREATIC DUCTS;  Surgeon: Vonda Antigua, MD;  Location: GI PROCEDURES MEM[facility]IAL [facility];  Service: Gastroenterology   ??? PR ESOPHAGOSCP RIG TRANSORAL HYPOPHARYNX CRV ESOPH N/A 04/04/2018    Procedure: ESOPHAGOSCOPY, RIGID, TRANSORAL WITH DIVERTICULECTOMY OF HYPOPHARYNX OR CERVICAL ESOPHAGUS, WITH CRICOPHARYNGEAL MYOTOMY, INCLUDES USE OF TELESCOPE OR OPERATING MICROSCOPE AND REPAIR, WHEN PERFORMED;  Surgeon: Vista Deck, MD;  Location: MAIN OR [facility];  Service: ENT   ??? PR INSER HEART TEMP PACER ONE CHMBR N/A 10/02/2016    Procedure: Tempoarary Pacemaker Insertion;  Surgeon: Meredith Leeds, MD;  Location: [facility];  Service: Cardiology   ??? PR LAP,DIAGNOSTIC ABDOMEN N/A 01/22/2016    Procedure: Laparoscopy, Abdomen, Peritoneum, & Omentum, Diagnostic, W/Wo Collection Specimen(S) By Brushing Or Washing;  Surgeon: Particia Nearing, MD;  Location: MAIN OR [facility];  Service: Transplant   ??? PR LARYNGOPLASTY MEDIALIZATION UNLIATERAL N/A 11/07/2018    Procedure: R31 LARYNGOPLASTY, MEDIALIZATION, UNILATERAL;  Surgeon: Vista Deck, MD;  Location: MAIN OR [facility];  Service: ENT   ??? PR PLACE PERCUT GASTROSTOMY TUBE N/A 11/17/2016    Procedure: UGI ENDO; W/DIRECTED PLCMT PERQ GASTROSTOMY TUBE;  Surgeon: Cletis Athens, MD;  Location: GI PROCEDURES [facility]IAL [facility]  Service: Gastroenterology   ??? PR TRACHEOSTOMY, PLANNED N/A 09/29/2016    Procedure: TRACHEOSTOMY PLANNED (SEPART PROC);  Surgeon: Katherina Mires, MD;  Location: MAIN OR [facility];  Service: Trauma   ??? PR TRANSCATH INSERT OR REPLACE LEADLESS PM VENTR N/A 10/11/2016    Procedure: Pacemaker Implant/Replace Leadless;  Surgeon: Meredith Leeds, MD;  Location: [facility];  Service: Cardiology   ??? PR TRANSPLANT LIVER,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    Procedure: Liver Allotransplantation; Orthotopic, Partial Or Whole, From Cadaver Or Living Donor, Any Age;  Surgeon: Doyce Loose, MD; Location: MAIN OR [facility];  Service: Transplant   ??? PR TRANSPLANT,PREP DONOR LIVER, WHOLE N/A 09/15/2016    Procedure: Rogelia Boga Std Prep Cad Donor Whole Liver Gft Prior Tnsplnt,Inc Chole,Diss/Rem Surr Tissu Wo Triseg/Lobe Splt;  Surgeon: Doyce Loose, MD;  Location: MAIN OR [facility];  Service: Transplant   ??? PR UPPER GI ENDOSCOPY,BIOPSY N/A 07/17/2012    Procedure: UGI ENDOSCOPY; WITH BIOPSY, SINGLE OR MULTIPLE;  Surgeon: Alba Destine, MD;  Location: GI PROCEDURES MEMORIAL [facility];  Service: Gastroenterology   ??? PR UPPER GI ENDOSCOPY,DIAGNOSIS N/A 02/04/2014    Procedure: UGI ENDO, INCLUDE ESOPHAGUS, STOMACH, & DUODENUM &/OR JEJUNUM; DX W/WO COLLECTION SPECIMN, BY BRUSH OR WASH;  Surgeon: Wilburt Finlay, MD;  Location: GI PROCEDURES MEMORIAL [facility];  Service: Gastroenterology   ??? PR UPPER GI ENDOSCOPY,LIGAT VARIX N/A 11/05/2013    Procedure: UGI ENDO; W/BAND LIG ESOPH &/OR GASTRIC VARICES;  Surgeon: Wilburt Finlay, MD;  Location:  GI PROCEDURES MEMORIAL [facility];  Service: Gastroenterology       Family History   Problem Relation Age of Onset   ??? Hypertension Mother    ??? Cancer Mother    ??? Heart disease Mother    ??? Heart disease Father    ??? Cirrhosis Neg Hx    ??? Liver cancer Neg Hx    ??? Anemia Neg Hx    ??? Diabetes Neg Hx    ??? Kidney disease Neg Hx    ??? Obesity Neg Hx    ??? Thyroid disease Neg Hx    ??? Osteoporosis Neg Hx    ??? Coronary artery disease Neg Hx    ??? Anesthesia problems Neg Hx    ??? Basal cell carcinoma Neg Hx    ??? Squamous cell carcinoma Neg Hx        Social History     Socioeconomic History   ??? Marital status: Married     Spouse name: Not on file   ??? Number of children: Not on file   ??? Years of education: Not on file   ??? Highest education level: Not on file   Occupational History   ??? Not on file   Tobacco Use   ??? Smoking status: Never Smoker   ??? Smokeless tobacco: Never Used   ??? Tobacco comment: Smoked in high school for about 5 years.    Substance and Sexual Activity   ??? Alcohol use: No   ??? Drug use: No   ??? Sexual activity: Yes     Partners: Female     Birth control/protection: None   Other Topics Concern   ??? Do you use sunscreen? Not Asked   ??? Tanning bed use? Not Asked   ??? Are you easily burned? Not Asked   ??? Excessive sun exposure? Not Asked   ??? Blistering sunburns? Not Asked   ??? Exercise No   ??? Living Situation No   Social History Narrative   ??? Not on file     Social Determinants of Health     Financial Resource Strain: Not on file   Food Insecurity: Not on file   Transportation Needs: Not on file   Physical Activity: Not on file   Stress: Not on file   Social Connections: Not on file       REVIEW OF SYSTEMS:   The balance of 10/12 systems is negative with the exception of HPI.    Objective:     MEDICATIONS:  No Known Allergies    Current Outpatient Medications   Medication Sig Dispense Refill   ??? alendronate (FOSAMAX) 70 MG tablet Take 1 tablet (70 mg total) by mouth every seven (7) days. 4 tablet 11   ??? amiodarone (PACERONE) 200 MG tablet Take 200 mg by mouth daily.     ??? bisacodyl (DULCOLAX, BISACODYL,) 5 mg EC tablet Take 5 mg by mouth daily as needed.      ??? chlorthalidone (HYGROTON) 25 MG tablet Take 1/2 tablet (12.5 mg total) by mouth every morning. 30 tablet 2   ??? cholecalciferol, vitamin D3, 125 mcg (5,000 unit) tablet Take 125 mcg by mouth daily.     ??? cycloSPORINE modified (NEORAL) 100 MG capsule Take 2 capsules (200 mg total) by mouth daily AND 1 capsule (100 mg total) nightly. For a total dose of 200 mg in the morning 175mg  every evening. 270 capsule 3   ??? cycloSPORINE modified (NEORAL) 25 MG capsule Take 3 capsules (75 mg  total) by mouth nightly. For a total dose of 200 mg in the morning 175mg  every evening 270 capsule 3   ??? DULoxetine (CYMBALTA) 30 MG capsule Take 1 capsule (30 mg total) by mouth nightly. 30 capsule 11   ??? magnesium oxide-Mg AA chelate (MAGNESIUM, AMINO ACID CHELATE,) 133 mg Tab Take 2 tablets by mouth Two (2) times a day. 120 tablet PRN   ??? melatonin 3 mg Tab Take 1 tablet (3 mg total) by mouth nightly. 30 tablet 0   ??? mirtazapine (REMERON) 15 MG tablet Take 1 tablet (15 mg total) by mouth nightly. 90 tablet 3   ??? mycophenolate (CELLCEPT) 250 mg capsule Take 2 capsules (500 mg total) by mouth two (2) times a day. 120 capsule 7   ??? predniSONE (DELTASONE) 5 MG tablet Take 4 tablets (20 mg) by mouth for one day, then take 3 tablets (15 mg) the next day, then take 2 tablets (10 mg) the next day, then take 1 tablet (5mg ) then stop 10 tablet 0   ??? pregabalin (LYRICA) 100 MG capsule Take 1 capsule (100 mg total) by mouth 2 (two) times daily 60 capsule 11   ??? pregabalin (LYRICA) 75 MG capsule Take 1 capsule (75 mg total) by mouth Two (2) times a day. 60 capsule 6   ??? sildenafiL, pulm.hypertension, (REVATIO) 20 mg tablet Take 2 tablets (40 mg total) by mouth Three (3) times a day. 180 tablet 5   ??? sodium bicarbonate 650 mg tablet Take 1 tablet (650 mg total) by mouth two (2) times a day. 180 tablet 3   ??? atorvastatin (LIPITOR) 20 MG tablet Take 20 mg by mouth daily.     ??? blood sugar diagnostic Strp Use three (3) times a day before meals. 100 each 7   ??? blood-glucose meter (GLUCOSE MONITORING KIT) kit Use as instructed 1 each 0   ??? glimepiride (AMARYL) 4 MG tablet Take 4 mg by mouth daily.      ??? insulin glargine (BASAGLAR KWIKPEN U-100 INSULIN) 100 unit/mL (3 mL) injection pen Inject 0.12 mL (12 Units total) under the skin nightly. 15 mL 12   ??? insulin lispro (HUMALOG KWIKPEN INSULIN) 100 unit/mL injection pen Inject 4 Units under the skin Three (3) times a day before meals. 15 mL 12   ??? pen needle, diabetic (ULTICARE PEN NEEDLE) 32 gauge x 5/32 (4 mm) Ndle USE WITH INSULIN PEN 4 TIMES DAILY AS DIRECTED 100 each 1   ??? rosuvastatin (CRESTOR) 5 MG tablet Take 1 tablet (5 mg total) by mouth once daily 30 tablet 11     No current facility-administered medications for this visit.         PHYSICAL EXAM:  BP 135/71 (BP Site: L Arm, BP Position: Sitting)  - Pulse 71  - Temp 37 ??C (98.6 ??F) (Tympanic)  - Ht 172.7 cm (5' 7.99)  - Wt 82.6 kg (182 lb 1.6 oz)  - BMI 27.69 kg/m??     General Appearance:  NAD, well appearing and well nourished.   HEENT:  [place]/AT. No scleral icterus. No cervical lymphadenopathy.   Pulmonary:    Normal respiratory effort. CTAB, without wheezes/crackles/rhonchi. Good air movement.    Cardiovascular:  Regular rate and rhythm, no murmur noted.   Extremities No edema.    Abdomen:   Normoactive bowel sounds, abdomen soft, non-tender and not distended, no Hepatosplenomegaly or masses. Abdominal scar well healed without hernia.    Musculoskeletal: No joint tenderness, full ROM. Normal gait.  Skin: Skin color, texture, turgor normal, no rashes or lesions.   Neurologic: Grossly intact.   Psychiatric: Judgement and insight appropriate.        LAB RESULTS Personally reviewed  All lab results last 24 hours:    Recent Results (from the past 48 hour(s))   Cyclosporine, Trough    Collection Time: 04/06/20 10:08 AM   Result Value Ref Range    Cyclosporine, Trough 33 (L) 100 - 400 ng/mL   Gamma GT    Collection Time: 04/06/20 10:08 AM   Result Value Ref Range    GGT 17 0 - 73 U/L   Magnesium Level    Collection Time: 04/06/20 10:08 AM   Result Value Ref Range    Magnesium 1.8 1.6 - 2.6 mg/dL   Phosphorus Level    Collection Time: 04/06/20 10:08 AM   Result Value Ref Range    Phosphorus 2.8 2.4 - 5.1 mg/dL   Bilirubin, Direct    Collection Time: 04/06/20 10:08 AM   Result Value Ref Range    Bilirubin, Direct 0.30 0.00 - 0.30 mg/dL   Comprehensive Metabolic Panel    Collection Time: 04/06/20 10:08 AM   Result Value Ref Range    Sodium 138 135 - 145 mmol/L    Potassium 4.4 3.4 - 4.5 mmol/L    Chloride 107 98 - 107 mmol/L    Anion Gap 9 5 - 14 mmol/L    CO2 22.0 20.0 - 31.0 mmol/L    BUN 31 (H) 9 - 23 mg/dL    Creatinine 0.98 (H) 0.60 - 1.10 mg/dL    BUN/Creatinine Ratio 14     EGFR CKD-EPI Non-African American, Male 30 (L) >=60 mL/min/1.43m2    EGFR CKD-EPI African American, Male 35 (L) >=60 mL/min/1.80m2    Glucose 157 (H) 70 - 99 mg/dL    Calcium 11.9 8.7 - 14.7 mg/dL    Albumin 4.0 3.4 - 5.0 g/dL    Total Protein 6.7 5.7 - 8.2 g/dL    Total Bilirubin 0.7 0.3 - 1.2 mg/dL    AST 13 <=82 U/L    ALT 11 10 - 49 U/L    Alkaline Phosphatase 84 46 - 116 U/L   CBC w/ Differential    Collection Time: 04/06/20 10:08 AM   Result Value Ref Range    WBC 5.0 4.5 - 11.0 10*9/L    RBC 4.34 (L) 4.50 - 5.90 10*12/L    HGB 13.2 (L) 13.5 - 17.5 g/dL    HCT 95.6 21.3 - 08.6 %    MCV 94.7 80.0 - 100.0 fL    MCH 30.4 26.0 - 34.0 pg    MCHC 32.1 31.0 - 37.0 g/dL    RDW 57.8 46.9 - 62.9 %    MPV 10.9 (H) 7.0 - 10.0 fL    Platelet 96 (L) 150 - 440 10*9/L    Neutrophils % 64.6 %    Lymphocytes % 25.4 %    Monocytes % 7.1 %    Eosinophils % 1.0 %    Basophils % 0.3 %    Absolute Neutrophils 3.2 2.0 - 7.5 10*9/L    Absolute Lymphocytes 1.3 (L) 1.5 - 5.0 10*9/L    Absolute Monocytes 0.4 0.2 - 0.8 10*9/L    Absolute Eosinophils 0.1 0.0 - 0.4 10*9/L    Absolute Basophils 0.0 0.0 - 0.1 10*9/L    Large Unstained Cells 2 0 - 4 %    Macrocytosis Slight (A) Not Present  IMAGING   None today     _______________________________________________        Gertie Fey, DNP, APRN, FNP-C  Abdominal Transplant Surgery Nurse Practitioner  [facility]  [address]  [place], [place]  [address]  [phone]

## 2020-04-06 NOTE — Unmapped (Signed)
Pt seen in clinic for annual post liver transplant follow up with NP Martin-Velez. Pt denies any medical issues this past year. He is retired, uses a cane to get around, and is the primary caregiver for his wife battling CA. He has an established PCP (Dr. Graciela Husbands) & endocrinologist (Dr. Yetta Barre). He obtains labs at [facility], and uses [facility] for IS. Discussed importance of sun protection (ie: SPF, hats), advised pt to reestablish with dermatology for annual skin exams. Pt refuses vaccinations at this time (flu, covid, shingrix), but understands that this [facility] and liver txp team recommend them. Pt reports to drink 4-5 bottles ( ) of water a day, no issues with UOP. 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 Pt unfortunately did not have medication list with him, will have to reconcile medications at a later time. Pt will confirm which statin he takes as 2 are listed on his med list. Pt noted to have been taking Prednisone 10mg  since 2020, will taper off per NP Martin-Velez. Pt advised to notify [facility] if exposed or have covid symptoms, time sensitivity and treatment availability discussed. Pt verbalized understanding.    Medication adjustments per NP Martin-Velez:  1) Prednisone 5mg  daily for one week, and then stop  2) Cellcept 250mg  BID after Prednisone stopped

## 2020-04-06 NOTE — Unmapped (Signed)
Medication changes:    1) Take Prednisone 5mg  (1 tablet) once a day for 1 week and then stop.  2) Cellcept 250mg  (1 capsule)  twice a day.

## 2020-04-07 DIAGNOSIS — D849 Immunodeficiency, unspecified: Principal | ICD-10-CM

## 2020-04-07 DIAGNOSIS — Z944 Liver transplant status: Principal | ICD-10-CM

## 2020-04-07 MED ORDER — CYCLOSPORINE MODIFIED 100 MG CAPSULE
ORAL_CAPSULE | ORAL | 3 refills | 90.00000 days | Status: CP
Start: 2020-04-07 — End: 2021-04-07
  Filled 2020-04-14: qty 90, 30d supply, fill #0

## 2020-04-07 MED ORDER — CYCLOSPORINE MODIFIED 25 MG CAPSULE
ORAL_CAPSULE | Freq: Every evening | ORAL | 3 refills | 90.00000 days | Status: CP
Start: 2020-04-07 — End: 2021-04-07

## 2020-04-07 NOTE — Unmapped (Signed)
Pt returned call. Medication list updated with information provided by pt. 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 NP Martin-Velez recommends repeat labs in 2 weeks and Cellcept dose change made after Prednisone is off. Pt agreeable with plan and verbalized understanding.

## 2020-04-07 NOTE — Unmapped (Signed)
Left pt vm to call back for medication reconciliation.

## 2020-04-09 DIAGNOSIS — Z79899 Other long term (current) drug therapy: Principal | ICD-10-CM

## 2020-04-09 DIAGNOSIS — Z944 Liver transplant status: Principal | ICD-10-CM

## 2020-04-09 MED ORDER — MYCOPHENOLATE MOFETIL 250 MG CAPSULE
ORAL_CAPSULE | Freq: Two times a day (BID) | ORAL | 7 refills | 30.00000 days | Status: CN
Start: 2020-04-09 — End: ?

## 2020-04-09 NOTE — Unmapped (Signed)
[facility] Clinical Assessment & Refill Coordination Note    Jeffrey Ward, DOB: 20-Jun-1950  Phone: [phone] (home)     All above HIPAA information was verified with patient.     Was a Nurse, learning disability used for this call? No    Specialty Medication(s):   Transplant: mycophenolate mofetil 250mg , cyclosporine 25mg  and cyclosporine 100mg      Current Outpatient Medications   Medication Sig Dispense Refill   ??? alendronate (FOSAMAX) 70 MG tablet Take 1 tablet (70 mg total) by mouth every seven (7) days. 4 tablet 11   ??? amiodarone (PACERONE) 200 MG tablet Take 200 mg by mouth daily.     ??? bisacodyl (DULCOLAX, BISACODYL,) 5 mg EC tablet Take 5 mg by mouth daily as needed.      ??? blood sugar diagnostic Strp Use three (3) times a day before meals. 100 each 7   ??? blood-glucose meter (GLUCOSE MONITORING KIT) kit Use as instructed 1 each 0   ??? chlorthalidone (HYGROTON) 25 MG tablet Take 1/2 tablet (12.5 mg total) by mouth every morning. 30 tablet 2   ??? cholecalciferol, vitamin D3, 125 mcg (5,000 unit) tablet Take 125 mcg by mouth daily.     ??? cycloSPORINE modified (NEORAL) 100 MG capsule Take 2 capsules (200 mg total) in the morning AND 1 capsule (with 3 x 25mg  capsules) nightly. For a total dose of 200 mg in the morning , 175mg  every evening. 270 capsule 3   ??? cycloSPORINE modified (NEORAL) 25 MG capsule Take 3 capsules (75 mg total) by mouth nightly (with a 100mg  capsule). For a total dose of 200 mg in the morning , 175mg  every evening 270 capsule 3   ??? DULoxetine (CYMBALTA) 30 MG capsule Take 1 capsule (30 mg total) by mouth nightly. 30 capsule 11   ??? glimepiride (AMARYL) 4 MG tablet Take 4 mg by mouth daily.      ??? insulin glargine (BASAGLAR KWIKPEN U-100 INSULIN) 100 unit/mL (3 mL) injection pen Inject 0.12 mL (12 Units total) under the skin nightly. 15 mL 12   ??? insulin lispro (HUMALOG KWIKPEN INSULIN) 100 unit/mL injection pen Inject 4 Units under the skin Three (3) times a day before meals. 15 mL 12   ??? magnesium oxide-Mg AA chelate (MAGNESIUM, AMINO ACID CHELATE,) 133 mg Tab Take 2 tablets by mouth Two (2) times a day. 120 tablet PRN   ??? melatonin 3 mg Tab Take 1 tablet (3 mg total) by mouth nightly. 30 tablet 0   ??? mirtazapine (REMERON) 15 MG tablet Take 1 tablet (15 mg total) by mouth nightly. 90 tablet 3   ??? mycophenolate (CELLCEPT) 250 mg capsule Take 2 capsules (500 mg total) by mouth two (2) times a day. 120 capsule 7   ??? pen needle, diabetic (ULTICARE PEN NEEDLE) 32 gauge x 5/32 (4 mm) Ndle USE WITH INSULIN PEN 4 TIMES DAILY AS DIRECTED 100 each 1   ??? predniSONE (DELTASONE) 5 MG tablet Take 4 tablets (20 mg) by mouth for one day, then take 3 tablets (15 mg) the next day, then take 2 tablets (10 mg) the next day, then take 1 tablet (5mg ) then stop 10 tablet 0   ??? pregabalin (LYRICA) 75 MG capsule Take 1 capsule (75 mg total) by mouth Two (2) times a day. 60 capsule 6   ??? rosuvastatin (CRESTOR) 5 MG tablet Take 1 tablet (5 mg total) by mouth once daily 30 tablet 11   ??? sildenafiL, pulm.hypertension, (REVATIO)  20 mg tablet Take 2 tablets (40 mg total) by mouth Three (3) times a day. 180 tablet 5   ??? sodium bicarbonate 650 mg tablet Take 1 tablet (650 mg total) by mouth two (2) times a day. 180 tablet 3     No current facility-administered medications for this visit.        Changes to medications: [name] reports no changes at this time.    No Known Allergies    Changes to allergies: No    SPECIALTY MEDICATION ADHERENCE     Cyclosporine 25 mg: 7 days of medicine on hand   Cyclosporine 100 mg: 7 days of medicine on hand   Mycophenolate 250 mg: 7 days of medicine on hand       Medication Adherence    Patient reported X missed doses in the last month: 0  Specialty Medication: Cyclosporine 25mg   Patient is on additional specialty medications: Yes  Additional Specialty Medications: Cyclosporine 100mg   Patient Reported Additional Medication X Missed Doses in the Last Month: 0  Patient is on more than two specialty medications: Yes  Specialty Medication: mycophenolate 250mg   Patient Reported Additional Medication X Missed Doses in the Last Month: 0  Support network for adherence: family member          Specialty medication(s) dose(s) confirmed: Regimen is correct and unchanged.     Are there any concerns with adherence? No    Adherence counseling provided? Not needed    CLINICAL MANAGEMENT AND INTERVENTION      Clinical Benefit Assessment:    Do you feel the medicine is effective or helping your condition? Yes    Clinical Benefit counseling provided? Not needed    Adverse Effects Assessment:    Are you experiencing any side effects? No    Are you experiencing difficulty administering your medicine? No    Quality of Life Assessment:    How many days over the past month did your liver transplant  keep you from your normal activities? For example, brushing your teeth or getting up in the morning. 0    Have you discussed this with your provider? Not needed    Therapy Appropriateness:    Is therapy appropriate? Yes, therapy is appropriate and should be continued    DISEASE/MEDICATION-SPECIFIC INFORMATION      N/A    PATIENT SPECIFIC NEEDS     - Does the patient have any physical, cognitive, or cultural barriers? No    - Is the patient high risk? Yes, patient is taking a REMS drug. Medication is dispensed in compliance with REMS program    - Does the patient require a Care Management Plan? No     - Does the patient require physician intervention or other additional services (i.e. nutrition, smoking cessation, social work)? No      SHIPPING     Specialty Medication(s) to be Shipped:   Transplant: mycophenolate mofetil 250mg , cyclosporine 25mg  and cyclosporine 100mg     Other medication(s) to be shipped: sildenafil, alendronate, duloxetine, mg, rosuvastatin     Changes to insurance: No    Delivery Scheduled: Yes, Expected medication delivery date: 04/14/20.     Medication will be delivered via Same Day Courier to the confirmed prescription address in [facility].    The patient will receive a drug information handout for each medication shipped and additional FDA Medication Guides as required.  Verified that patient has previously received a Conservation officer, historic buildings.    All of the patient's questions and concerns have  been addressed.    Madsen Riddle C Leslyn Monda   [facility] Specialty Pharmacist

## 2020-04-09 NOTE — Unmapped (Signed)
Pt request for RX Refill mycophenolate (CELLCEPT) 250 mg capsule

## 2020-04-13 ENCOUNTER — Institutional Professional Consult (permissible substitution): Admit: 2020-04-13 | Discharge: 2020-04-14 | Payer: MEDICARE

## 2020-04-13 DIAGNOSIS — F32A Depression, unspecified: Principal | ICD-10-CM

## 2020-04-13 DIAGNOSIS — I4891 Unspecified atrial fibrillation: Principal | ICD-10-CM

## 2020-04-13 DIAGNOSIS — Z45018 Encounter for adjustment and management of other part of cardiac pacemaker: Principal | ICD-10-CM

## 2020-04-13 DIAGNOSIS — I499 Cardiac arrhythmia, unspecified: Principal | ICD-10-CM

## 2020-04-13 DIAGNOSIS — Z794 Long term (current) use of insulin: Principal | ICD-10-CM

## 2020-04-13 DIAGNOSIS — E119 Type 2 diabetes mellitus without complications: Principal | ICD-10-CM

## 2020-04-13 DIAGNOSIS — I495 Sick sinus syndrome: Principal | ICD-10-CM

## 2020-04-13 NOTE — Unmapped (Signed)
[facility]  Cardiac Device Clinic-  [place]  Tel: [phone]  Fax: [phone]    Cardiac Implanted Electronic Device Evaluation-In Person Outpatient Visit    Visit Date:  04/13/2020    Manufacturer of Device: Medtronic     Model: Micra   Type of Device: Single Chamber Pacemaker    Battery: >8 years estimated longevity    Mode: VVIR  Lower rate limit: 50 bpm    Presenting rhythm:  VS  Underlying rhythm: NSR   Dependent: No    Pacing Percentages  RVP: 11.5%    Impedence (ohms)  Right Ventricular lead: 530     Sensing (mV)  Right Ventricular lead: 8.1 mV     Capture threshold (V @ ms)  Right Ventricular lead: 0.5 V @ 0.24 ms    Diagnostics/Episodes  ?? none    Fluid Monitoring  ??  Not Applicable       Reprogramming  ?? none    Follow-up Plan:  Continue Routine Remote Monitoring      Please see scanned and / or downloaded PDF file in this patient's chart for this encounter for full details of device interrogation and reprogramming.

## 2020-04-13 NOTE — Unmapped (Addendum)
Stop amiodarone  Plan to see [place] back in 6 months to see how you are doing off amiodarone  If you have recurrent atrial fibrillation in the meantime please let me know

## 2020-04-13 NOTE — Unmapped (Signed)
Referring Provider: [name], MD  [address]  [facility] [place],  [place] [address]  Phone: [phone]  Fax: [phone]     Primary Provider: [name], MD  [address] [facility] [place] [address]  Phone: [phone]  Fax: [phone]       Date of consultation: 04/13/2020      Follow-up Visit     Assessment and Plan:     Sinus node dysfunction  S/P micra pacemaker 10/11/20  Jeffrey Ward is a 70 y.o. 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  His amiodarone was stopped on previous follow-up and he has remained in sinus rhythm since.    Today he presents for device check.  The Micra leadless pacemaker is functioning normally.      PLAN  1. Stop amiodarone   2. If AF recurrence, restart oral anticoagulation and schedule electrical cardioversion   3. Routine device clinic follow-up    We will follow up in about 6 months (10/11/2020).  I've requested Jeffrey Ward to contact me if has AF recurrence in the interim.      All questions were answered for the patient.  Thank you very much for the referral.  Please contact me with any questions at telephone number [phone].        Entered by Bobbie Stack, B.S., on behalf of Dr. Kathi Ludwig, on 04/12/2020, 6:05 PM    Documentation assistance was provided by the scribe. I was present during the time the encounter was recorded. The information recorded by the scribe was done at my direction and has been reviewed and validated by me. The documentation recorded by the scribe accurately reflects the service I personally performed and the decisions made by me.     Daiva Huge, BSc (Hons), MBChB, MRCP, [facility]  Assistant Professor of Medicine  Clinical Cardiac Electrophysiology  [employer] [place] [place]     04/12/2020, 6:05 PM    History of Presenting Illness:   Jeffrey Ward is a 70 y.o.??male??with a past medical history of??liver transplant, postoperative persistent atrial fibrillation requiring amiodarone, and sinus node dysfunction manifest as intermittent sinus node arrest, who underwent a Medtronic leadless (Micra) pacemaker implantation on 10/11/2016. His amiodarone was discontinued 12/2016 and he has remained in sinus rhythm. Jeffrey Ward presents today for a device check. He states he is doing well and has no new issues regarding his device or arrythmias. He reports that is not currently on a bloodthinner.     Device interrogation in clinic today was normal, with normal lead parameters and device function (see scanned in report).  There have been no significant arrhythmia.    Past Medical and Surgical History:     Past Medical History:   Diagnosis Date   ??? Atrial fibrillation (CMS-HCC)    ??? Basal cell carcinoma    ??? Cancer (CMS-HCC)    ??? Cirrhosis (CMS-HCC)    ??? Depression    ??? Diabetes (CMS-HCC)    ??? Headache    ??? Hepatitis C  07/17/2012   ??? History of transfusion    ??? Liver disease    ??? Low back pain 07/17/2012   ??? Varices, esophageal (CMS-HCC)        Past Surgical History:   Procedure Laterality Date   ??? ANKLE SURGERY     ??? BACK SURGERY     ??? BACK SURGERY     ??? CARDIAC SURGERY      pacemaker   ??? CHG US GUIDE, TISSUE ABLATION N/A 01/22/2016    Procedure: ULTRASOUND GUIDANCE FOR, AND MONITORING OF, PARENCHYMAL TISSUE ABLATION;  Surgeon: Particia Nearing, MD;  Location: MAIN OR [facility];  Service: Transplant   ??? IR EMBOLIZATION ORGAN ISCHEMIA, TUMORS, INFAR  06/16/2016    IR EMBOLIZATION ORGAN ISCHEMIA, TUMORS, INFAR 06/16/2016 Ammie Dalton, MD IMG VIR H&V [facility]   ??? PR COLON CA SCRN NOT HI RSK IND N/A 02/27/2015    Procedure: COLOREC CNCR SCR;COLNSCPY NO;  Surgeon: Vonda Antigua, MD;  Location: GI PROCEDURES MEMORIAL [facility];  Service: Gastroenterology   ??? PR ENDOSCOPIC ULTRASOUND EXAM N/A 02/27/2015    Procedure: UGI ENDO; W/ENDO ULTRASOUND EXAM INCLUDES ESOPHAGUS, STOMACH, &/OR DUODENUM/JEJUNUM;  Surgeon: Vonda Antigua, MD;  Location: GI PROCEDURES MEMORIAL [facility];  Service: Gastroenterology   ??? PR ERCP BALLOON DILATE BILIARY/PANC DUCT/AMPULLA EA N/A 02/10/2017    Procedure: ERCP;WITH TRANS-ENDOSCOPIC BALLOON DILATION OF BILIARY/PANCREATIC DUCT(S) OR OF AMPULLA, INCLUDING SPHINCTERECTOMY, WHEN PERFOREMD,EACH DUCT ([address]);  Surgeon: Mayford Knife, MD;  Location: GI PROCEDURES [facility];  Service: Gastroenterology   ??? PR ERCP REMOVE FOREIGN BODY/STENT BILIARY/PANC DUCT N/A 02/10/2017    Procedure: ENDOSCOPIC RETROGRADE CHOLANGIOPANCREATOGRAPHY (ERCP); W/ REMOVAL OF FOREIGN BODY/STENT FROM BILIARY/PANCREATIC DUCT(S);  Surgeon: Mayford Knife, MD;  Location: GI PROCEDURES MEMORIAL [facility];  Service: Gastroenterology   ??? PR ERCP REMOVE FOREIGN BODY/STENT BILIARY/PANC DUCT N/A 06/29/2017    Procedure: ENDOSCOPIC RETROGRADE CHOLANGIOPANCREATOGRAPHY (ERCP); W/ REMOVAL OF FOREIGN BODY/STENT FROM BILIARY/PANCREATIC DUCT(S);  Surgeon: Vonda Antigua, MD;  Location: GI PROCEDURES MEMORIAL [facility];  Service: Gastroenterology   ??? PR ERCP STENT PLACEMENT BILIARY/PANCREATIC DUCT N/A 11/29/2016    Procedure: ENDOSCOPIC RETROGRADE CHOLANGIOPANCREATOGRAPHY (ERCP); WITH PLACEMENT OF ENDOSCOPIC STENT INTO BILIARY OR PANCREATIC DUCT;  Surgeon: Chriss Driver, MD;  Location: GI PROCEDURES [facility]ORIAL [facility];  Service: Gastroenterology   ??? PR ERCP STENT PLACEMENT BILIARY/PANCREATIC DUCT N/A 04/26/2017    Procedure: ENDOSCOPIC RETROGRADE CHOLANGIOPANCREATOGRAPHY (ERCP); WITH PLACEMENT OF ENDOSCOPIC STENT INTO BILIARY OR PANCREATIC DUCT;  Surgeon: Mayford Knife, MD;  Location: GI PROCEDURES MEMORIAL [facility];  Service: Gastroenterology   ??? PR ERCP,W/REMOVAL STONE,BIL/PANCR DUCTS N/A 11/29/2016    Procedure: ERCP; W/ENDOSCOPIC RETROGRADE REMOVAL OF CALCULUS/CALCULI FROM BILIARY &/OR PANCREATIC DUCTS;  Surgeon: Chriss Driver, MD;  Location: GI PROCEDURES [facility];  Service: Gastroenterology   ??? PR ERCP,W/REMOVAL STONE,BIL/PANCR DUCTS N/A 02/10/2017    Procedure: ERCP; W/ENDOSCOPIC RETROGRADE REMOVAL OF CALCULUS/CALCULI FROM BILIARY &/OR PANCREATIC DUCTS;  Surgeon: Mayford Knife, MD;  Location: GI PROCEDURES [facility];  Service: Gastroenterology   ??? PR ERCP,W/REMOVAL STONE,BIL/PANCR DUCTS N/A 04/26/2017    Procedure: ERCP; W/ENDOSCOPIC RETROGRADE REMOVAL OF CALCULUS/CALCULI FROM BILIARY &/OR PANCREATIC DUCTS;  Surgeon: Mayford Knife, MD;  Location: GI PROCEDURES MEMORIAL [facility];  Service: Gastroenterology   ??? PR ERCP,W/REMOVAL STONE,BIL/PANCR DUCTS N/A 06/29/2017    Procedure: ERCP; W/ENDOSCOPIC RETROGRADE REMOVAL OF CALCULUS/CALCULI FROM BILIARY &/OR PANCREATIC DUCTS;  Surgeon: Vonda Antigua, MD;  Location: GI PROCEDURES MEMORIAL [facility];  Service: Gastroenterology   ??? PR ESOPHAGOSCP RIG TRANSORAL HYPOPHARYNX CRV ESOPH  N/A 04/04/2018    Procedure: ESOPHAGOSCOPY, RIGID, TRANSORAL WITH DIVERTICULECTOMY OF HYPOPHARYNX OR CERVICAL ESOPHAGUS, WITH CRICOPHARYNGEAL MYOTOMY, INCLUDES USE OF TELESCOPE OR OPERATING MICROSCOPE AND REPAIR, WHEN PERFORMED;  Surgeon: Vista Deck, MD;  Location: MAIN OR [facility];  Service: ENT   ??? PR INSER HEART TEMP PACER ONE CHMBR N/A 10/02/2016    Procedure: Tempoarary Pacemaker Insertion;  Surgeon: Meredith Leeds, MD;  Location: [facility];  Service: Cardiology   ??? PR LAP,DIAGNOSTIC ABDOMEN N/A 01/22/2016    Procedure: Laparoscopy, Abdomen, Peritoneum, & Omentum, Diagnostic, W/Wo Collection Specimen(S) By Brushing Or Washing;  Surgeon: Particia Nearing, MD;  Location: MAIN OR [facility];  Service: Transplant   ??? PR LARYNGOPLASTY MEDIALIZATION UNLIATERAL N/A 11/07/2018    Procedure: R31 LARYNGOPLASTY, MEDIALIZATION, UNILATERAL;  Surgeon: Vista Deck, MD;  Location: MAIN OR [facility];  Service: ENT   ??? PR PLACE PERCUT GASTROSTOMY TUBE N/A 11/17/2016    Procedure: UGI ENDO; W/DIRECTED PLCMT PERQ GASTROSTOMY TUBE;  Surgeon: Cletis Athens, MD;  Location: GI PROCEDURES MEMORIAL [facility];  Service: Gastroenterology   ??? PR TRACHEOSTOMY, PLANNED N/A 09/29/2016    Procedure: TRACHEOSTOMY PLANNED (SEPART PROC);  Surgeon: Katherina Mires, MD;  Location: MAIN OR [facility];  Service: Trauma   ??? PR TRANSCATH INSERT OR REPLACE LEADLESS PM VENTR N/A 10/11/2016    Procedure: Pacemaker Implant/Replace Leadless;  Surgeon: Meredith Leeds, MD;  Location: [facility] EP;  Service: Cardiology   ??? PR TRANSPLANT LIVER,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    Procedure: Liver Allotransplantation; Orthotopic, Partial Or Whole, From Cadaver Or Living Donor, Any Age;  Surgeon: Doyce Loose, MD;  Location: MAIN OR [facility];  Service: Transplant   ??? PR TRANSPLANT,PREP DONOR LIVER, WHOLE N/A 09/15/2016    Procedure: Rogelia Boga Std Prep Cad Donor Whole Liver Gft Prior Tnsplnt,Inc Chole,Diss/Rem Surr Tissu Wo Triseg/Lobe Splt;  Surgeon: Doyce Loose, MD;  Location: MAIN OR [facility];  Service: Transplant   ??? PR UPPER GI ENDOSCOPY,BIOPSY N/A 07/17/2012    Procedure: UGI ENDOSCOPY; WITH BIOPSY, SINGLE OR MULTIPLE;  Surgeon: Alba Destine, MD;  Location: GI PROCEDURES [facility];  Service: Gastroenterology   ??? PR UPPER GI ENDOSCOPY,DIAGNOSIS N/A 02/04/2014    Procedure: UGI ENDO, INCLUDE ESOPHAGUS, STOMACH, & DUODENUM &/OR JEJUNUM; DX W/WO COLLECTION SPECIMN, BY BRUSH OR WASH;  Surgeon: Wilburt Finlay, MD;  Location: GI PROCEDURES [facility];  Service: Gastroenterology   ??? PR UPPER GI ENDOSCOPY,LIGAT VARIX N/A 11/05/2013    Procedure: UGI ENDO; W/BAND LIG ESOPH &/OR GASTRIC VARICES;  Surgeon: Wilburt Finlay, MD;  Location: GI PROCEDURES MEMORIAL [facility];  Service: Gastroenterology       Medications and Allergies:     Current Outpatient Medications   Medication Sig Dispense Refill   ??? alendronate (FOSAMAX) 70 MG tablet Take 1 tablet (70 mg total) by mouth every seven (7) days. 4 tablet 11   ??? amiodarone (PACERONE) 200 MG tablet Take 200 mg by mouth daily.     ??? bisacodyl (DULCOLAX, BISACODYL,) 5 mg EC tablet Take 5 mg by mouth daily as needed.      ??? blood sugar diagnostic Strp Use three (3) times a day before meals. 100 each 7   ??? blood-glucose meter (GLUCOSE MONITORING KIT) kit Use as instructed 1 each 0   ??? chlorthalidone (HYGROTON) 25 MG tablet Take 1/2 tablet (12.5 mg total) by mouth every morning. 30 tablet 2   ??? cholecalciferol, vitamin D3, 125 mcg (5,000 unit) tablet Take 125 mcg by mouth daily.     ???  cycloSPORINE modified (NEORAL) 100 MG capsule Take 2 capsules (200 mg total) in the morning AND 1 capsule (with 3 x 25mg  capsules) nightly. For a total dose of 200 mg in the morning , 175mg  every evening. 270 capsule 3   ??? cycloSPORINE modified (NEORAL) 25 MG capsule Take 3 capsules (75 mg total) by mouth nightly (with a 100mg  capsule). For a total dose of 200 mg in the morning , 175mg  every evening 270 capsule 3   ??? DULoxetine (CYMBALTA) 30 MG capsule Take 1 capsule (30 mg total) by mouth nightly. 30 capsule 11   ??? glimepiride (AMARYL) 4 MG tablet Take 4 mg by mouth daily.      ??? insulin glargine (BASAGLAR KWIKPEN U-100 INSULIN) 100 unit/mL (3 mL) injection pen Inject 0.12 mL (12 Units total) under the skin nightly. 15 mL 12   ??? insulin lispro (HUMALOG KWIKPEN INSULIN) 100 unit/mL injection pen Inject 4 Units under the skin Three (3) times a day before meals. 15 mL 12   ??? magnesium oxide-Mg AA chelate (MAGNESIUM, AMINO ACID CHELATE,) 133 mg Tab Take 2 tablets by mouth Two (2) times a day. 120 tablet PRN   ??? melatonin 3 mg Tab Take 1 tablet (3 mg total) by mouth nightly. 30 tablet 0   ??? mirtazapine (REMERON) 15 MG tablet Take 1 tablet (15 mg total) by mouth nightly. 90 tablet 3   ??? mycophenolate (CELLCEPT) 250 mg capsule Take 2 capsules (500 mg total) by mouth two (2) times a day. 120 capsule 7   ??? pen needle, diabetic (ULTICARE PEN NEEDLE) 32 gauge x 5/32 (4 mm) Ndle USE WITH INSULIN PEN 4 TIMES DAILY AS DIRECTED 100 each 1   ??? predniSONE (DELTASONE) 5 MG tablet Take 4 tablets (20 mg) by mouth for one day, then take 3 tablets (15 mg) the next day, then take 2 tablets (10 mg) the next day, then take 1 tablet (5mg ) then stop 10 tablet 0   ??? pregabalin (LYRICA) 75 MG capsule Take 1 capsule (75 mg total) by mouth Two (2) times a day. 60 capsule 6   ??? rosuvastatin (CRESTOR) 5 MG tablet Take 1 tablet (5 mg total) by mouth once daily 30 tablet 11   ??? sildenafiL, pulm.hypertension, (REVATIO) 20 mg tablet Take 2 tablets (40 mg total) by mouth Three (3) times a day. 180 tablet 5   ??? sodium bicarbonate 650 mg tablet Take 1 tablet (650 mg total) by mouth two (2) times a day. 180 tablet 3     No current facility-administered medications for this visit.       Allergies  Patient has no known allergies.    Family History:     The patient's family history includes Cancer in his mother; Heart disease in his father and mother; Hypertension in his mother.    Social History:     Social History     Socioeconomic History   ??? Marital status: Married     Spouse name: Not on file   ??? Number of children: Not on file   ??? Years of education: Not on file   ??? Highest education level: Not on file   Occupational History   ??? Not on file   Tobacco Use   ??? Smoking status: Never Smoker   ??? Smokeless tobacco: Never Used   ??? Tobacco comment: Smoked in high school for about 5 years.    Substance and Sexual Activity   ??? Alcohol use: No   ???  Drug use: No   ??? Sexual activity: Yes     Partners: Female     Birth control/protection: None   Other Topics Concern   ??? Do you use sunscreen? Not Asked   ??? Tanning bed use? Not Asked   ??? Are you easily burned? Not Asked   ??? Excessive sun exposure? Not Asked   ??? Blistering sunburns? Not Asked   ??? Exercise No   ??? Living Situation No   Social History Narrative   ??? Not on file     Social Determinants of Health     Financial Resource Strain: Not on file   Food Insecurity: Not on file   Transportation Needs: Not on file   Physical Activity: Not on file   Stress: Not on file   Social Connections: Not on file       Review of Systems:     All 10 systems reviewed and negative unless otherwise states in the HPI      Physical Examination:     There were no vitals filed for this visit.     Wt Readings from Last 3 Encounters:   04/06/20 82.6 kg (182 lb 1.6 oz)   01/22/20 86.2 kg (190 lb)   11/19/19 86.5 kg (190 lb 9.6 oz)        There is no height or weight on file to calculate BMI.    General: well nourished, appears comfortable on examination couch.  Mental: alert, orientated and appropriate.  Thorax: ventilation comfortable.   Cardiovascular: regular rhythm, perfusion normal.   Neurological: no focal neurological deficit.  Extremities: no edema.      Investigations:     Lab Review   Lab Results   Component Value Date    WBC 5.0 04/06/2020    HGB 13.2 (L) 04/06/2020    HCT 41.1 04/06/2020    MCV 94.7 04/06/2020    PLT 96 (L) 04/06/2020     Lab Results   Component Value Date    BUN 31 (H) 04/06/2020    CO2 22.0 04/06/2020    CREATININE 2.14 (H) 04/06/2020    K 4.4 04/06/2020    NA 138 04/06/2020    CL 107 04/06/2020    CALCIUM 10.4 04/06/2020       Lab Results   Component Value Date    TROPONINI <0.034 04/05/2018       Lab Results   Component Value Date    MG 1.8 04/06/2020       Lab Results   Component Value Date    PHOS 2.8 04/06/2020     Lab Results   Component Value Date    PT 11.6 11/05/2018    INR 1.01 11/05/2018     No results found for: BNP  Lab Results   Component Value Date    ALT 11 04/06/2020    AST 13 04/06/2020    GGT 17 04/06/2020    ALKPHOS 84 04/06/2020    BILITOT 0.7 04/06/2020     Lab Results   Component Value Date    TRIG 101 12/26/2016    HDL 70 (H) 12/26/2016    LDL 122 (H) 12/26/2016       Lab Results   Component Value Date    TSH 1.970 04/10/2017       Cardiac Investigations:    12-lead ECG (personal review):  04/13/20: NSR, 70/min     EP Procedures and Devices:  ?? 09/2016 Medtronic Micra pacemaker  ??  Non-Invasive  Evaluation(s):  Echocardiogram:  TTE 12/10/19:  1. Technically difficult study due to chest wall/lung interference.    2. The left ventricular systolic function is normal, LVEF is visually  estimated at 55-60%.    3. The aortic valve is probably trileaflet with mildly thickened leaflets  with normal excursion.    4. The right ventricle is normal in size, with normal systolic function.    TTE 04/2017  ?? Technically difficult study due to chest wall/lung interference  ?? Normal left ventricular systolic function, ejection fraction 65 to 70%  ?? Diastolic dysfunction - grade I (normal filling pressures)  ?? Dilated left atrium  ?? Normal right ventricular systolic function

## 2020-04-14 DIAGNOSIS — Z79899 Other long term (current) drug therapy: Principal | ICD-10-CM

## 2020-04-14 DIAGNOSIS — Z944 Liver transplant status: Principal | ICD-10-CM

## 2020-04-14 MED ORDER — MYCOPHENOLATE MOFETIL 250 MG CAPSULE
ORAL_CAPSULE | Freq: Two times a day (BID) | ORAL | 7 refills | 30 days | Status: CP
Start: 2020-04-14 — End: 2021-04-14
  Filled 2020-04-14: qty 120, 30d supply, fill #0

## 2020-04-14 MED FILL — ROSUVASTATIN 5 MG TABLET: 30 days supply | Qty: 30 | Fill #8

## 2020-04-14 MED FILL — DULOXETINE 30 MG CAPSULE,DELAYED RELEASE: ORAL | 30 days supply | Qty: 30 | Fill #1

## 2020-04-14 MED FILL — CYCLOSPORINE MODIFIED 25 MG CAPSULE: ORAL | 30 days supply | Qty: 90 | Fill #0

## 2020-04-14 MED FILL — MG-PLUS-PROTEIN 133 MG TABLET: ORAL | 30 days supply | Qty: 120 | Fill #1

## 2020-04-14 MED FILL — ALENDRONATE 70 MG TABLET: ORAL | 28 days supply | Qty: 4 | Fill #2

## 2020-04-14 MED FILL — SILDENAFIL (PULMONARY HYPERTENSION) 20 MG TABLET: ORAL | 30 days supply | Qty: 180 | Fill #3

## 2020-04-27 ENCOUNTER — Ambulatory Visit: Admit: 2020-04-27 | Discharge: 2020-04-27 | Payer: MEDICARE

## 2020-04-27 DIAGNOSIS — Z79899 Other long term (current) drug therapy: Principal | ICD-10-CM

## 2020-04-27 DIAGNOSIS — K769 Liver disease, unspecified: Principal | ICD-10-CM

## 2020-04-27 DIAGNOSIS — Z944 Liver transplant status: Principal | ICD-10-CM

## 2020-04-27 DIAGNOSIS — M810 Age-related osteoporosis without current pathological fracture: Principal | ICD-10-CM

## 2020-04-27 DIAGNOSIS — E1169 Type 2 diabetes mellitus with other specified complication: Principal | ICD-10-CM

## 2020-04-27 LAB — CBC W/ AUTO DIFF
BASOPHILS ABSOLUTE COUNT: 0 10*9/L (ref 0.0–0.1)
BASOPHILS RELATIVE PERCENT: 0.4 %
EOSINOPHILS ABSOLUTE COUNT: 0 10*9/L (ref 0.0–0.7)
EOSINOPHILS RELATIVE PERCENT: 0.6 %
HEMATOCRIT: 39 % (ref 38.0–50.0)
HEMOGLOBIN: 12.7 g/dL — ABNORMAL LOW (ref 13.5–17.5)
LYMPHOCYTES ABSOLUTE COUNT: 1.3 10*9/L (ref 0.7–4.0)
LYMPHOCYTES RELATIVE PERCENT: 26.9 %
MEAN CORPUSCULAR HEMOGLOBIN CONC: 32.5 g/dL (ref 30.0–36.0)
MEAN CORPUSCULAR HEMOGLOBIN: 29.4 pg (ref 26.0–34.0)
MEAN CORPUSCULAR VOLUME: 90.3 fL (ref 81.0–95.0)
MEAN PLATELET VOLUME: 10.3 fL — ABNORMAL HIGH (ref 7.0–10.0)
MONOCYTES ABSOLUTE COUNT: 0.4 10*9/L (ref 0.1–1.0)
MONOCYTES RELATIVE PERCENT: 7.8 %
NEUTROPHILS ABSOLUTE COUNT: 3.2 10*9/L (ref 1.7–7.7)
NEUTROPHILS RELATIVE PERCENT: 64.3 %
NUCLEATED RED BLOOD CELLS: 0 /100{WBCs} (ref <=4)
PLATELET COUNT: 79 10*9/L — ABNORMAL LOW (ref 150–450)
RED BLOOD CELL COUNT: 4.32 10*12/L (ref 4.32–5.72)
RED CELL DISTRIBUTION WIDTH: 13.8 % (ref 12.0–15.0)
WBC ADJUSTED: 5 10*9/L (ref 3.5–10.5)

## 2020-04-27 LAB — COMPREHENSIVE METABOLIC PANEL
ALBUMIN: 3.8 g/dL (ref 3.4–5.0)
ALKALINE PHOSPHATASE: 83 U/L (ref 46–116)
ALT (SGPT): 9 U/L — ABNORMAL LOW (ref 10–49)
ANION GAP: 6 mmol/L (ref 5–14)
AST (SGOT): 13 U/L (ref <=34)
BILIRUBIN TOTAL: 0.6 mg/dL (ref 0.3–1.2)
BLOOD UREA NITROGEN: 38 mg/dL — ABNORMAL HIGH (ref 9–23)
BUN / CREAT RATIO: 17
CALCIUM: 10.2 mg/dL (ref 8.7–10.4)
CHLORIDE: 110 mmol/L — ABNORMAL HIGH (ref 98–107)
CO2: 23.1 mmol/L (ref 20.0–31.0)
CREATININE: 2.21 mg/dL — ABNORMAL HIGH
EGFR CKD-EPI AA MALE: 33 mL/min/{1.73_m2} — ABNORMAL LOW (ref >=60)
EGFR CKD-EPI NON-AA MALE: 29 mL/min/{1.73_m2} — ABNORMAL LOW (ref >=60)
GLUCOSE RANDOM: 146 mg/dL (ref 70–179)
POTASSIUM: 5.2 mmol/L — ABNORMAL HIGH (ref 3.4–4.5)
PROTEIN TOTAL: 6.4 g/dL (ref 5.7–8.2)
SODIUM: 139 mmol/L (ref 135–145)

## 2020-04-27 LAB — MAGNESIUM: MAGNESIUM: 1.6 mg/dL (ref 1.6–2.6)

## 2020-04-27 LAB — PHOSPHORUS: PHOSPHORUS: 2.9 mg/dL (ref 2.4–5.1)

## 2020-04-27 LAB — GAMMA GT: GAMMA GLUTAMYL TRANSFERASE: 14 U/L

## 2020-04-27 LAB — BILIRUBIN, DIRECT: BILIRUBIN DIRECT: 0.2 mg/dL (ref 0.00–0.30)

## 2020-04-27 LAB — TSH: THYROID STIMULATING HORMONE: 3.833 u[IU]/mL (ref 0.550–4.780)

## 2020-04-27 MED ORDER — OZEMPIC 0.25 MG OR 0.5 MG (2 MG/1.5 ML) SUBCUTANEOUS PEN INJECTOR
SUBCUTANEOUS | 3 refills | 0.00000 days | Status: CP
Start: 2020-04-27 — End: ?
  Filled 2020-05-04: qty 1.5, 28d supply, fill #0

## 2020-04-27 NOTE — Unmapped (Signed)
No meter or pump to download. POC glucose and A1C done today. PP 9:00am (coffee w/sugar). 166 mg/dL.

## 2020-04-27 NOTE — Unmapped (Signed)
[facility] Endocrinology Endocrine Follow    Assessment/Plan:  70 y.o. male here for evaluation of hypothyroidism.  1. Abnormal thyroid labs. Last TSH was normal. Free T4 has been consistently high. This is most likely due to an interference with the free T4 lab. Clinically he is euthyroid today.  - Repeat TSH and free T4 in 6 months.    2. DM-2. A1c near goal. Given his difficulty with weight gain, changing to a GLP-1 RA will likely be best for him. This will also have a lower risk for hypoglycemia.  - Discontinue insulin and sulfonyurea.   - Start semaglutide (Ozempic) 0.25 mg weekly for 4 weeks then increase to 0.5 mg weekly.  - A1c in 3 months.  - FSBG if concerned for hypoglycemia.    3. Bone health / osteoporosis. Alendronate appears to be causing acid reflux. Will stop this for now and repeat bone density. Will determine additional therapy based on DXA results.  - DXA.  - Discontinue alendronate.    4. Vitamin D deficiency.  - Continue vitamin D 1000 units daily.  - 25-D level due around 06/2020.    All tests and treatments were discussed with the patient who agreed to the plans as documented. They currently have no questions and agree to call the clinic or contact us if questions arise.    Return to clinic: 3 months.    Alben Spittle, MD  [facility] Department of Endocrinology    Reason for Consult: Abnormal thyroid function tests.    Provider Requesting Consult: Ala Dach    PCP: [name], MD    My initial encounter history of present illness from 01/20/2017:  70 y.o. male with relevant history of abnormal thyroid function tests, DM-2 and HCV cirrhosis s/p liver transplant in 09/2016 seen at the request of Dr. Ala Dach in consultation for evaluation of abnormal thyroid function tests. Initial diagnosis of abnormal thyroid function tests was on 11/01/2016. He had a prolonged hospital course after transplant which included A-fib for which he was on amiodarone. He has been on it for a number of weeks and stopped this medication last week due to a high TSH and a high free T4. Mr. Wajda has had a slow recovery since his surgery and has chronic fatigue and weakness. He is currently using a walker for ambulation. He has chronic cold intolerance as well, which has been present for several years. He also reports an occasional tremor. He denies palpitations, unexpected weight change, diarrhea or constipation. Currently he feels at his baseline. He is fatigued and feels weak. He also complains of peripheral neuropathy symptoms. He tried gabapentin for this, but it caused severe fatigue and weakness.    Interim HPI:  Last seen by me on 07/16/2018 (virtual). Mr. Sotelo returns today for follow up for abnormal thyroid function tests and would like to discuss diabetes with me today as well. He feels well today with no complaints. He chronically has cold intolerance. He denies fatigue, constipation, diarrhea, tremor, palpitations (except when he has hypoglycemia), heat intolerance, unexpected weight change or any other symptoms.    Patient has a history of type 2 diabetes diagnosed in 2018.  Current diabetes regimen: Lantus (Glargine) 12 units daily, glimeperide 4 mg daily.  Current blood glucose monitoring: Not checking.  Typical blood glucose range:  Not checking.  - Patient report reviewed.  Hypoglycemia awareness: Yes. These happen only when he skips meals.  Complications related to diabetes: Not discussed in depth  today.          Past Medical History:   Diagnosis Date   ??? Atrial fibrillation (CMS-HCC)    ??? Basal cell carcinoma    ??? Cancer (CMS-HCC)    ??? Cirrhosis (CMS-HCC)    ??? Depression    ??? Diabetes (CMS-HCC)    ??? Headache    ??? Hepatitis C 07/17/2012   ??? History of transfusion    ??? Liver disease    ??? Low back pain 07/17/2012   ??? Varices, esophageal (CMS-HCC)        Past Surgical History:   Procedure Laterality Date   ??? ANKLE SURGERY     ??? BACK SURGERY     ??? BACK SURGERY     ??? CARDIAC SURGERY pacemaker   ??? CHG US GUIDE, TISSUE ABLATION N/A 01/22/2016    Procedure: ULTRASOUND GUIDANCE FOR, AND MONITORING OF, PARENCHYMAL TISSUE ABLATION;  Surgeon: Particia Nearing, MD;  Location: MAIN OR [facility];  Service: Transplant   ??? IR EMBOLIZATION ORGAN ISCHEMIA, TUMORS, INFAR  06/16/2016    IR EMBOLIZATION ORGAN ISCHEMIA, TUMORS, INFAR 06/16/2016 Ammie Dalton, MD IMG VIR H&V [facility]   ??? PR COLON CA SCRN NOT HI RSK IND N/A 02/27/2015    Procedure: COLOREC CNCR SCR;COLNSCPY NO;  Surgeon: Vonda Antigua, MD;  Location: [facility]URES [facility];  Service: Gastroenterology   ??? PR ENDOSCOPIC ULTRASOUND EXAM N/A 02/27/2015    Procedure: UGI ENDO; W/ENDO ULTRASOUND EXAM INCLUDES ESOPHAGUS, STOMACH, &/OR DUODENUM/JEJUNUM;  Surgeon: Vonda Antigua, MD;  Location: GI PROCEDURES [facility];  Service: Gastroenterology   ??? PR ERCP BALLOON DILATE BILIARY/PANC DUCT/AMPULLA EA N/A 02/10/2017    Procedure: ERCP;WITH TRANS-ENDOSCOPIC BALLOON DILATION OF BILIARY/PANCREATIC DUCT(S) OR OF AMPULLA, INCLUDING SPHINCTERECTOMY, WHEN PERFOREMD,EACH DUCT ([id number]);  Surgeon: Mayford Knife, MD;  Location: GI PROCEDURES [facility];  Service: Gastroenterology   ??? PR ERCP REMOVE FOREIGN BODY/STENT BILIARY/PANC DUCT N/A 02/10/2017    Procedure: ENDOSCOPIC RETROGRADE CHOLANGIOPANCREATOGRAPHY (ERCP); W/ REMOVAL OF FOREIGN BODY/STENT FROM BILIARY/PANCREATIC DUCT(S);  Surgeon: Mayford Knife, MD;  Location: GI PROCEDURES [facility]ORIAL [facility];  Service: Gastroenterology   ??? PR ERCP REMOVE FOREIGN BODY/STENT BILIARY/PANC DUCT N/A 06/29/2017    Procedure: ENDOSCOPIC RETROGRADE CHOLANGIOPANCREATOGRAPHY (ERCP); W/ REMOVAL OF FOREIGN BODY/STENT FROM BILIARY/PANCREATIC DUCT(S);  Surgeon: Vonda Antigua, MD;  Location: GI PROCEDURES [facility];  Service: Gastroenterology   ??? PR ERCP STENT PLACEMENT BILIARY/PANCREATIC DUCT N/A 11/29/2016    Procedure: ENDOSCOPIC RETROGRADE CHOLANGIOPANCREATOGRAPHY (ERCP); WITH PLACEMENT OF ENDOSCOPIC STENT INTO BILIARY OR PANCREATIC DUCT;  Surgeon: Chriss Driver, MD;  Location: GI PROCEDURES [facility];  Service: Gastroenterology   ??? PR ERCP STENT PLACEMENT BILIARY/PANCREATIC DUCT N/A 04/26/2017    Procedure: ENDOSCOPIC RETROGRADE CHOLANGIOPANCREATOGRAPHY (ERCP); WITH PLACEMENT OF ENDOSCOPIC STENT INTO BILIARY OR PANCREATIC DUCT;  Surgeon: Mayford Knife, MD;  Location: GI PROCEDURES [facility];  Service: Gastroenterology   ??? PR ERCP,W/REMOVAL STONE,BIL/PANCR DUCTS N/A 11/29/2016    Procedure: ERCP; W/ENDOSCOPIC RETROGRADE REMOVAL OF CALCULUS/CALCULI FROM BILIARY &/OR PANCREATIC DUCTS;  Surgeon: Chriss Driver, MD;  Location: [facility]  Service: Gastroenterology   ??? PR ERCP,W/REMOVAL STONE,BIL/PANCR DUCTS N/A 02/10/2017    Procedure: ERCP; W/ENDOSCOPIC RETROGRADE REMOVAL OF CALCULUS/CALCULI FROM BILIARY &/OR PANCREATIC DUCTS;  Surgeon: Mayford Knife, MD;  Location: GI PROCEDURES MEMORIAL [facility];  Service: Gastroenterology   ??? PR ERCP,W/REMOVAL STONE,BIL/PANCR DUCTS N/A 04/26/2017    Procedure: ERCP; W/ENDOSCOPIC RETROGRADE REMOVAL OF CALCULUS/CALCULI FROM BILIARY &/OR PANCREATIC DUCTS;  Surgeon: Mayford Knife,  MD;  Location: GI PROCEDURES [facility]IAL [facility];  Service: Gastroenterology   ??? PR ERCP,W/REMOVAL STONE,BIL/PANCR DUCTS N/A 06/29/2017    Procedure: ERCP; W/ENDOSCOPIC RETROGRADE REMOVAL OF CALCULUS/CALCULI FROM BILIARY &/OR PANCREATIC DUCTS;  Surgeon: Vonda Antigua, MD;  Location: GI PROCEDURES [facility];  Service: Gastroenterology   ??? PR ESOPHAGOSCP RIG TRANSORAL HYPOPHARYNX CRV ESOPH N/A 04/04/2018    Procedure: ESOPHAGOSCOPY, RIGID, TRANSORAL WITH DIVERTICULECTOMY OF HYPOPHARYNX OR CERVICAL ESOPHAGUS, WITH CRICOPHARYNGEAL MYOTOMY, INCLUDES USE OF TELESCOPE OR OPERATING MICROSCOPE AND REPAIR, WHEN PERFORMED;  Surgeon: Vista Deck, MD;  Location: MAIN OR [facility];  Service: ENT   ??? PR INSER HEART TEMP PACER ONE CHMBR N/A 10/02/2016    Procedure: Tempoarary Pacemaker Insertion; Surgeon: Meredith Leeds, MD;  Location: [facility];  Service: Cardiology   ??? PR LAP,DIAGNOSTIC ABDOMEN N/A 01/22/2016    Procedure: Laparoscopy, Abdomen, Peritoneum, & Omentum, Diagnostic, W/Wo Collection Specimen(S) By Brushing Or Washing;  Surgeon: Particia Nearing, MD;  Location: MAIN OR [facility];  Service: Transplant   ??? PR LARYNGOPLASTY MEDIALIZATION UNLIATERAL N/A 11/07/2018    Procedure: R31 LARYNGOPLASTY, MEDIALIZATION, UNILATERAL;  Surgeon: Vista Deck, MD;  Location: MAIN OR [facility];  Service: ENT   ??? PR PLACE PERCUT GASTROSTOMY TUBE N/A 11/17/2016    Procedure: UGI ENDO; W/DIRECTED PLCMT PERQ GASTROSTOMY TUBE;  Surgeon: Cletis Athens, MD;  Location: [facility] PROC[facility]URES [facility];  Service: Gastroenterology   ??? PR TRACHEOSTOMY, PLANNED N/A 09/29/2016    Procedure: TRACHEOSTOMY PLANNED (SEPART PROC);  Surgeon: Katherina Mires, MD;  Location: MAIN OR [facility];  Service: Trauma   ??? PR TRANSCATH INSERT OR REPLACE LEADLESS PM VENTR N/A 10/11/2016    Procedure: Pacemaker Implant/Replace Leadless;  Surgeon: Meredith Leeds, MD;  Location: [facility];  Service: Cardiology   ??? PR TRANSPLANT LIVER,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    Procedure: Liver Allotransplantation; Orthotopic, Partial Or Whole, From Cadaver Or Living Donor, Any Age;  Surgeon: Doyce Loose, MD;  Location: MAIN OR [facility];  Service: Transplant   ??? PR TRANSPLANT,PREP DONOR LIVER, WHOLE N/A 09/15/2016    Procedure: Rogelia Boga Std Prep Cad Donor Whole Liver Gft Prior Tnsplnt,Inc Chole,Diss/Rem Surr Tissu Wo Triseg/Lobe Splt;  Surgeon: Doyce Loose, MD;  Location: MAIN OR [facility];  Service: Transplant   ??? PR UPPER GI ENDOSCOPY,BIOPSY N/A 07/17/2012    Procedure: UGI ENDOSCOPY; WITH BIOPSY, SINGLE OR MULTIPLE;  Surgeon: Alba Destine, MD;  Location: GI PROCEDURES [facility];  Service: Gastroenterology   ??? PR UPPER GI ENDOSCOPY,DIAGNOSIS N/A 02/04/2014    Procedure: UGI ENDO, INCLUDE ESOPHAGUS, STOMACH, & DUODENUM &/OR JEJUNUM; DX W/WO COLLECTION SPECIMN, BY BRUSH OR WASH;  Surgeon: Wilburt Finlay, MD;  Location: GI PROCEDURES [facility];  Service: Gastroenterology   ??? PR UPPER GI ENDOSCOPY,LIGAT VARIX N/A 11/05/2013    Procedure: UGI ENDO; W/BAND LIG ESOPH &/OR GASTRIC VARICES;  Surgeon: Wilburt Finlay, MD;  Location: GI PROCEDURES [facility];  Service: Gastroenterology       Family History   Problem Relation Age of Onset   ??? Hypertension Mother    ??? Cancer Mother    ??? Heart disease Mother    ??? Heart disease Father    ??? Cirrhosis Neg Hx    ??? Liver cancer Neg Hx    ??? Anemia Neg Hx    ??? Diabetes Neg Hx    ??? Kidney disease Neg Hx    ??? Obesity Neg Hx    ??? Thyroid disease Neg Hx    ??? Osteoporosis  Neg Hx    ??? Coronary artery disease Neg Hx    ??? Anesthesia problems Neg Hx    ??? Basal cell carcinoma Neg Hx    ??? Squamous cell carcinoma Neg Hx        Social History     Socioeconomic History   ??? Marital status: Married     Spouse name: Not on file   ??? Number of children: Not on file   ??? Years of education: Not on file   ??? Highest education level: Not on file   Occupational History   ??? Not on file   Tobacco Use   ??? Smoking status: Never Smoker   ??? Smokeless tobacco: Never Used   ??? Tobacco comment: Smoked in high school for about 5 years.    Substance and Sexual Activity   ??? Alcohol use: No   ??? Drug use: No   ??? Sexual activity: Yes     Partners: Female     Birth control/protection: None   Other Topics Concern   ??? Do you use sunscreen? Not Asked   ??? Tanning bed use? Not Asked   ??? Are you easily burned? Not Asked   ??? Excessive sun exposure? Not Asked   ??? Blistering sunburns? Not Asked   ??? Exercise No   ??? Living Situation No   Social History Narrative   ??? Not on file     Social Determinants of Health     Financial Resource Strain: Not on file   Food Insecurity: Not on file   Transportation Needs: Not on file   Physical Activity: Not on file   Stress: Not on file   Social Connections: Not on file       Review of systems:  General ROS:   Negative except as stated above.    Medication list:  Current Outpatient Medications on File Prior to Visit   Medication Sig Dispense Refill   ??? alendronate (FOSAMAX) 70 MG tablet Take 1 tablet (70 mg total) by mouth every seven (7) days. 4 tablet 11   ??? bisacodyl (DULCOLAX, BISACODYL,) 5 mg EC tablet Take 5 mg by mouth daily as needed.      ??? blood-glucose meter (GLUCOSE MONITORING KIT) kit Use as instructed 1 each 0   ??? chlorthalidone (HYGROTON) 25 MG tablet Take 1/2 tablet (12.5 mg total) by mouth every morning. 30 tablet 2   ??? cholecalciferol, vitamin D3, 125 mcg (5,000 unit) tablet Take 125 mcg by mouth daily.     ??? cycloSPORINE modified (NEORAL) 100 MG capsule Take 2 capsules (200 mg total) in the morning AND 1 capsule (with 3 x 25mg  capsules) nightly. For a total dose of 200 mg in the morning , 175mg  every evening. 270 capsule 3   ??? cycloSPORINE modified (NEORAL) 25 MG capsule Take 3 capsules (75 mg total) by mouth nightly (with a 100mg  capsule). For a total dose of 200 mg in the morning , 175mg  every evening 270 capsule 3   ??? DULoxetine (CYMBALTA) 30 MG capsule Take 1 capsule (30 mg total) by mouth nightly. 30 capsule 11   ??? magnesium oxide-Mg AA chelate (MAGNESIUM, AMINO ACID CHELATE,) 133 mg Tab Take 2 tablets by mouth Two (2) times a day. 120 tablet PRN   ??? melatonin 3 mg Tab Take 1 tablet (3 mg total) by mouth nightly. 30 tablet 0   ??? mirtazapine (REMERON) 15 MG tablet Take 1 tablet (15 mg total) by mouth nightly. 90 tablet 3   ???  mycophenolate (CELLCEPT) 250 mg capsule Take 2 capsules (500 mg total) by mouth two (2) times a day. 120 capsule 7   ??? predniSONE (DELTASONE) 5 MG tablet Take 4 tablets (20 mg) by mouth for one day, then take 3 tablets (15 mg) the next day, then take 2 tablets (10 mg) the next day, then take 1 tablet (5mg ) then stop 10 tablet 0   ??? pregabalin (LYRICA) 75 MG capsule Take 1 capsule (75 mg total) by mouth Two (2) times a day. 60 capsule 6   ??? rosuvastatin (CRESTOR) 5 MG tablet Take 1 tablet (5 mg total) by mouth once daily 30 tablet 11   ??? sildenafiL, pulm.hypertension, (REVATIO) 20 mg tablet Take 2 tablets (40 mg total) by mouth Three (3) times a day. 180 tablet 5   ??? sodium bicarbonate 650 mg tablet Take 1 tablet (650 mg total) by mouth two (2) times a day. 180 tablet 3   ??? blood sugar diagnostic Strp Use three (3) times a day before meals. 100 each 7   ??? glimepiride (AMARYL) 4 MG tablet Take 4 mg by mouth daily.      ??? insulin glargine (BASAGLAR KWIKPEN U-100 INSULIN) 100 unit/mL (3 mL) injection pen Inject 0.12 mL (12 Units total) under the skin nightly. 15 mL 12   ??? insulin lispro (HUMALOG KWIKPEN INSULIN) 100 unit/mL injection pen Inject 4 Units under the skin Three (3) times a day before meals. (Patient not taking: Reported on 04/27/2020) 15 mL 12   ??? pen needle, diabetic (ULTICARE PEN NEEDLE) 32 gauge x 5/32 (4 mm) Ndle USE WITH INSULIN PEN 4 TIMES DAILY AS DIRECTED 100 each 1   ??? [DISCONTINUED] gabapentin (NEURONTIN) 100 MG capsule Take 1 capsule (100 mg total) by mouth Three (3) times a day. 270 capsule 3     No current facility-administered medications on file prior to visit.       Physical exam:  Vitals:    04/27/20 1355   BP: 147/87   Pulse: 58     Wt Readings from Last 3 Encounters:   04/27/20 84.5 kg (186 lb 3.2 oz)   04/13/20 82.6 kg (182 lb)   04/06/20 82.6 kg (182 lb 1.6 oz)     Physical Exam  Constitutional:       General: He is not in acute distress.     Appearance: Normal appearance. He is not ill-appearing, toxic-appearing or diaphoretic.   Cardiovascular:      Rate and Rhythm: Normal rate and regular rhythm.      Heart sounds: No murmur heard.      Pulmonary:      Effort: Pulmonary effort is normal. No respiratory distress.      Breath sounds: Normal breath sounds.   Abdominal:      General: Bowel sounds are normal. There is no distension.      Palpations: Abdomen is soft.      Tenderness: There is no abdominal tenderness.   Neurological:      Mental Status: He is alert.   Psychiatric: Mood and Affect: Mood normal.         Behavior: Behavior normal.           Labs reviewed:  Lab Results   Component Value Date    A1C 7.3 (H) 04/27/2020       Lab Results   Component Value Date    NA 138 04/06/2020    K 4.4 04/06/2020    CL 107 04/06/2020  CO2 22.0 04/06/2020    BUN 31 (H) 04/06/2020    CREATININE 2.14 (H) 04/06/2020    GFR >= 60 02/15/2012    GLU 157 (H) 04/06/2020    CALCIUM 10.4 04/06/2020    ALBUMIN 4.0 04/06/2020    PHOS 2.8 04/06/2020       Lab Results   Component Value Date    TSH 1.970 04/10/2017     Results for GRAE, CANNATA (MRN [id number]) as of 04/27/2020 14:10   Ref. Range 11/01/2016 06:29 11/02/2016 06:18 12/26/2016 08:58 02/06/2017 09:30 04/10/2017 09:55   TSH Latest Ref Range: 0.600 - 3.300 uIU/mL 4.094 (H)  8.260 (H) 6.124 (H) 1.970   Free T4 Latest Ref Range: 0.71 - 1.40 ng/dL  2.59 (H) 5.63 (H) 8.75 (H) 1.77 (H)       Results for JAYLEE, LANTRY (MRN [id number]) as of 04/27/2020 14:17   Ref. Range 06/14/2019 11:45   Vitamin D Total (25OH) Latest Ref Range: 20.0 - 80.0 ng/mL 38.5         Other studies reviewed:  Dexa Bone Density Skeletal  Order: [id number]   Status: Final result ??   Visible to patient: Yes (not seen) ??   Next appt: 05/18/2020 at 03:10 PM in Endocrinology Lequita Halt Lelan Pons, MD) ??   Dx: Liver replaced by transplant (CMS-HCC... ??   0 Result Notes    Details    Reading Physician Reading Date Result Priority   [place] Bayfield Renner, [place]  [id number] 09/28/2017      Narrative & Impression  EXAM: DEXA BONE DENSITY SKELETAL  DATE: 09/21/2017 9:03 AM  ACCESSION: [id number]  DICTATED: 09/28/2017 11:37 AM  INTERPRETATION LOCATION: Main Campus  ??  CLINICAL INDICATION: 70 years old Male with a history of glucocorticoid use  ??  TECHNIQUE: Dual energy x-ray absorptiometry was performed assessing the bone mineral density in the left forearm and the proximal left femur using a [employer].    The current examination is compared to a recent measurement dated 11/30/2015 and a baseline measurement dated 06/02/2008.   ??  FINDINGS:   The total bone mineral density in the proximal left femur measures 0.688 gm/cm2.  The Z score is -1.6 and the T score is -2.3.  This represents a significant decrease of 12.2% when compared to the recent measurement of 0.784 gm/cm2 and a significant decrease of 20% when compared with the baseline measurement of 0.860 gm/cm2.  The femoral neck density is 0.570 gm/cm2, and the femoral neck T score is -2.7.  The other T scores scores are -2.2.  ??  The total bone mineral density in the left forearm measures 0.591 gm/cm2.  The Z score is -0.7 and the T score is -1.8.  This represents an insignificant decrease of 3.5% when compared to the recent measurement of 0.613 gm/cm2 and a significant decrease of 6.7% when compared with the baseline measurement of 0.634 gm/cm2.  The other T scores range from -0.1 to -3.0.  ??  IMPRESSION:  Left forearm: Mildly low bone density.  The measurement has decreased significantly since baseline.  Left proximal femur:Low bone density, particularly in the femoral neck.  The measurement has decreased significantly since recent and baseline studies.Marland Kitchen

## 2020-04-28 LAB — CYCLOSPORINE, TROUGH: CYCLOSPORINE, TROUGH: 139 ng/mL (ref 100–400)

## 2020-04-28 NOTE — Unmapped (Addendum)
Called pt to review 2/14 labs. He confirmed that CsA level not a true trough. He is off Prednisone and started Cellcept 250mg  BID. LFTs stable, will repeat labs Q61months per new protocol.

## 2020-04-30 DIAGNOSIS — Z944 Liver transplant status: Principal | ICD-10-CM

## 2020-04-30 DIAGNOSIS — Z79899 Other long term (current) drug therapy: Principal | ICD-10-CM

## 2020-04-30 NOTE — Unmapped (Signed)
Standing lab orders placed

## 2020-05-05 NOTE — Unmapped (Signed)
[facility] Refill Coordination Note    Specialty Medication(s) to be Shipped:   Transplant: mycophenolate mofetil 250mg , cyclosporine 25mg  and cyclosporine 100mg     Other medication(s) to be shipped: chlorthalidone, magnesium, mirtazapine, rosuvastatin and sildenafil     Jeffrey Ward, DOB: 27-Nov-1950  Phone: [phone] (home)       All above HIPAA information was verified with patient.     Was a Nurse, learning disability used for this call? No    Completed refill call assessment today to schedule patient's medication shipment from the [facility] [phone]).       Specialty medication(s) and dose(s) confirmed: Regimen is correct and unchanged.   Changes to medications: [name] reports no changes at this time.  Changes to insurance: No  Questions for the pharmacist: No    Confirmed patient received Welcome Packet with first shipment. The patient will receive a drug information handout for each medication shipped and additional FDA Medication Guides as required.       DISEASE/MEDICATION-SPECIFIC INFORMATION        N/A    SPECIALTY MEDICATION ADHERENCE     Medication Adherence    Patient reported X missed doses in the last month: 0  Specialty Medication: Mycophenolate 250mg   Patient is on additional specialty medications: Yes  Additional Specialty Medications: Cyclosporine 25mg   Patient Reported Additional Medication X Missed Doses in the Last Month: 0  Patient is on more than two specialty medications: Yes  Specialty Medication: Cyclosporine 100mg   Patient Reported Additional Medication X Missed Doses in the Last Month: 0  Support network for adherence: family member        Mycophenolate 250 mg: 9 days of medicine on hand   Cyclosporine 25 mg: 9 days of medicine on hand   Cyclosporine 100 mg: 9 days of medicine on hand     SHIPPING     Shipping address confirmed in Epic.     Delivery Scheduled: Yes, Expected medication delivery date: 05/12/2020.     Medication will be delivered via Next Day Courier to the prescription address in Epic WAM.    [name]   [facility] Specialty Technician

## 2020-05-08 MED FILL — DULOXETINE 30 MG CAPSULE,DELAYED RELEASE: ORAL | 30 days supply | Qty: 30 | Fill #2

## 2020-05-12 NOTE — Unmapped (Signed)
Pt approved delivery for medications that are available to be filled for 05/13/20.

## 2020-05-13 MED FILL — MG-PLUS-PROTEIN 133 MG TABLET: ORAL | 30 days supply | Qty: 120 | Fill #2

## 2020-05-13 MED FILL — MIRTAZAPINE 15 MG TABLET: ORAL | 90 days supply | Qty: 90 | Fill #3

## 2020-05-13 MED FILL — MYCOPHENOLATE MOFETIL 250 MG CAPSULE: ORAL | 30 days supply | Qty: 120 | Fill #1

## 2020-05-13 MED FILL — ROSUVASTATIN 5 MG TABLET: 30 days supply | Qty: 30 | Fill #9

## 2020-05-13 MED FILL — CHLORTHALIDONE 25 MG TABLET: ORAL | 60 days supply | Qty: 30 | Fill #2

## 2020-05-13 MED FILL — CYCLOSPORINE MODIFIED 25 MG CAPSULE: ORAL | 30 days supply | Qty: 90 | Fill #1

## 2020-05-13 MED FILL — SILDENAFIL (PULMONARY HYPERTENSION) 20 MG TABLET: ORAL | 30 days supply | Qty: 180 | Fill #4

## 2020-05-18 ENCOUNTER — Ambulatory Visit: Admit: 2020-05-18 | Discharge: 2020-05-18 | Payer: MEDICARE

## 2020-05-18 ENCOUNTER — Institutional Professional Consult (permissible substitution): Admit: 2020-05-18 | Discharge: 2020-05-18 | Payer: MEDICARE

## 2020-05-18 DIAGNOSIS — E1169 Type 2 diabetes mellitus with other specified complication: Principal | ICD-10-CM

## 2020-05-18 DIAGNOSIS — E559 Vitamin D deficiency, unspecified: Principal | ICD-10-CM

## 2020-05-18 DIAGNOSIS — M810 Age-related osteoporosis without current pathological fracture: Principal | ICD-10-CM

## 2020-05-18 MED ORDER — OZEMPIC 1 MG/DOSE (4 MG/3 ML) SUBCUTANEOUS PEN INJECTOR
SUBCUTANEOUS | 3 refills | 84 days | Status: CP
Start: 2020-05-18 — End: ?
  Filled 2020-05-21: qty 3, 28d supply, fill #0

## 2020-05-18 MED FILL — CYCLOSPORINE MODIFIED 100 MG CAPSULE: ORAL | 30 days supply | Qty: 90 | Fill #1

## 2020-05-18 NOTE — Unmapped (Signed)
Called patient to confirm delivery of cyclosporine 100mg . Medication is set to deliver 05/19/2020.

## 2020-05-18 NOTE — Unmapped (Signed)
No meter or pump downloaded. POC glucose done today. PP 9 am. 320 mg/dL.

## 2020-05-18 NOTE — Unmapped (Signed)
[facility] Endocrinology Endocrine Follow    Assessment/Plan:  70 y.o. male here for evaluation of hypothyroidism.  1. DM-2. A1c near goal. Doing well with semaglutide. No concerning side effects. A1c less than 8%. No additional weight gain. Will increase to maximum dose. High blood sugar today was from eating Peeps, I counseled him to avoid sugary foods like this.  - Discontinue insulin and sulfonyurea.   - Semaglutide 1.  - A1c in 3 months from previous.  - FSBG if concerned for hypoglycemia.    2. Bone health / osteoporosis. Limited options given CKD. He still needs to have DXA completed.  - DXA scheduled for 06/08/2020.    3. Abnormal thyroid function labs. Last TSH was WNL. No further workup at this time.    4. Vitamin D deficiency.  - Continue vitamin D 5000 units daily.  - 25-D level due around 06/2020.    5. CKD. Stable creatinine.    All tests and treatments were discussed with the patient who agreed to the plans as documented. They currently have no questions and agree to call the clinic or contact us if questions arise.    Return to clinic: 3 months.    Alben Spittle, MD  [facility] Department of Endocrinology    Reason for Consult: Abnormal thyroid function tests.    Provider Requesting Consult: Graciela Husbands    PCP: [name], MD    My initial encounter history of present illness from 01/20/2017:  70 y.o. male with relevant history of abnormal thyroid function tests, DM-2 and HCV cirrhosis s/p liver transplant in 09/2016 seen at the request of Dr. Graciela Husbands in consultation for evaluation of abnormal thyroid function tests. Initial diagnosis of abnormal thyroid function tests was on 11/01/2016. He had a prolonged hospital course after transplant which included A-fib for which he was on amiodarone. He has been on it for a number of weeks and stopped this medication last week due to a high TSH and a high free T4. Mr. Savino has had a slow recovery since his surgery and has chronic fatigue and weakness. He is currently using a walker for ambulation. He has chronic cold intolerance as well, which has been present for several years. He also reports an occasional tremor. He denies palpitations, unexpected weight change, diarrhea or constipation. Currently he feels at his baseline. He is fatigued and feels weak. He also complains of peripheral neuropathy symptoms. He tried gabapentin for this, but it caused severe fatigue and weakness.    Interim HPI:  Last seen by me on 04/27/2020. Mr. Ripple returns today for follow up for diabetes. He feels well today with no complaints. He has tolerated semaglutide very well. He is taking 0.5 mg weekly.    Patient has a history of type 2 diabetes diagnosed in 2018.  Current diabetes regimen: Semaglutide 0.5 mg weekly  Current blood glucose monitoring: Occasional checks.  Typical blood glucose range:  Typically less than 150. He had high blood sugar today which he attributes to eating Peeps.  - Patient report reviewed.  Hypoglycemia awareness: Yes. No recent hypoglycemia.  Complications related to diabetes: Not discussed in depth today.          Past Medical History:   Diagnosis Date   ??? Atrial fibrillation (CMS-HCC)    ??? Basal cell carcinoma    ??? Cancer (CMS-HCC)    ??? Cirrhosis (CMS-HCC)    ??? Depression    ??? Diabetes (CMS-HCC)    ???  Headache    ??? Hepatitis C 07/17/2012   ??? History of transfusion    ??? Liver disease    ??? Low back pain 07/17/2012   ??? Varices, esophageal (CMS-HCC)        Past Surgical History:   Procedure Laterality Date   ??? ANKLE SURGERY     ??? BACK SURGERY     ??? BACK SURGERY     ??? CARDIAC SURGERY      pacemaker   ??? CHG US GUIDE, TISSUE ABLATION N/A 01/22/2016    Procedure: ULTRASOUND GUIDANCE FOR, AND MONITORING OF, PARENCHYMAL TISSUE ABLATION;  Surgeon: Particia Nearing, MD;  Location: MAIN OR [facility];  Service: Transplant   ??? IR EMBOLIZATION ORGAN ISCHEMIA, TUMORS, INFAR  06/16/2016    IR EMBOLIZATION ORGAN ISCHEMIA, TUMORS, INFAR 06/16/2016 Ammie Dalton, MD IMG VIR H&V [facility]   ??? PR COLON CA SCRN NOT HI RSK IND N/A 02/27/2015    Procedure: COLOREC CNCR SCR;COLNSCPY NO;  Surgeon: Vonda Antigua, MD;  Location: GI PROCEDURES MEMORIAL [facility];  Service: Gastroenterology   ??? PR ENDOSCOPIC ULTRASOUND EXAM N/A 02/27/2015    Procedure: UGI ENDO; W/ENDO ULTRASOUND EXAM INCLUDES ESOPHAGUS, STOMACH, &/OR DUODENUM/JEJUNUM;  Surgeon: Vonda Antigua, MD;  Location: GI PROCEDURES MEMORIAL [facility];  Service: Gastroenterology   ??? PR ERCP BALLOON DILATE BILIARY/PANC DUCT/AMPULLA EA N/A 02/10/2017    Procedure: ERCP;WITH TRANS-ENDOSCOPIC BALLOON DILATION OF BILIARY/PANCREATIC DUCT(S) OR OF AMPULLA, INCLUDING SPHINCTERECTOMY, WHEN PERFOREMD,EACH DUCT ([id number]);  Surgeon: Mayford Knife, MD;  Location: GI PROCEDURES MEMORIAL [facility];  Service: Gastroenterology   ??? PR ERCP REMOVE FOREIGN BODY/STENT BILIARY/PANC DUCT N/A 02/10/2017    Procedure: ENDOSCOPIC RETROGRADE CHOLANGIOPANCREATOGRAPHY (ERCP); W/ REMOVAL OF FOREIGN BODY/STENT FROM BILIARY/PANCREATIC DUCT(S);  Surgeon: Mayford Knife, MD;  Location: GI PROCEDURES MEMORIAL [facility];  Service: Gastroenterology   ??? PR ERCP REMOVE FOREIGN BODY/STENT BILIARY/PANC DUCT N/A 06/29/2017    Procedure: ENDOSCOPIC RETROGRADE CHOLANGIOPANCREATOGRAPHY (ERCP); W/ REMOVAL OF FOREIGN BODY/STENT FROM BILIARY/PANCREATIC DUCT(S);  Surgeon: Vonda Antigua, MD;  Location: GI PROCEDURES MEMORIAL [facility];  Service: Gastroenterology   ??? PR ERCP STENT PLACEMENT BILIARY/PANCREATIC DUCT N/A 11/29/2016    Procedure: ENDOSCOPIC RETROGRADE CHOLANGIOPANCREATOGRAPHY (ERCP); WITH PLACEMENT OF ENDOSCOPIC STENT INTO BILIARY OR PANCREATIC DUCT;  Surgeon: Chriss Driver, MD;  Location: GI PROCEDURES MEMORIAL [facility];  Service: Gastroenterology   ??? PR ERCP STENT PLACEMENT BILIARY/PANCREATIC DUCT N/A 04/26/2017    Procedure: ENDOSCOPIC RETROGRADE CHOLANGIOPANCREATOGRAPHY (ERCP); WITH PLACEMENT OF ENDOSCOPIC STENT INTO BILIARY OR PANCREATIC DUCT;  Surgeon: Mayford Knife, MD; Location: GI PROCEDURES MEMORIAL [facility];  Service: Gastroenterology   ??? PR ERCP,W/REMOVAL STONE,BIL/PANCR DUCTS N/A 11/29/2016    Procedure: ERCP; W/ENDOSCOPIC RETROGRADE REMOVAL OF CALCULUS/CALCULI FROM BILIARY &/OR PANCREATIC DUCTS;  Surgeon: Chriss Driver, MD;  Location: GI PROCEDURES MEMORIAL [facility];  Service: Gastroenterology   ??? PR ERCP,W/REMOVAL STONE,BIL/PANCR DUCTS N/A 02/10/2017    Procedure: ERCP; W/ENDOSCOPIC RETROGRADE REMOVAL OF CALCULUS/CALCULI FROM BILIARY &/OR PANCREATIC DUCTS;  Surgeon: Mayford Knife, MD;  Location: GI PROCEDURES MEMORIAL [facility];  Service: Gastroenterology   ??? PR ERCP,W/REMOVAL STONE,BIL/PANCR DUCTS N/A 04/26/2017    Procedure: ERCP; W/ENDOSCOPIC RETROGRADE REMOVAL OF CALCULUS/CALCULI FROM BILIARY &/OR PANCREATIC DUCTS;  Surgeon: Mayford Knife, MD;  Location: GI PROCEDURES MEMORIAL [facility];  Service: Gastroenterology   ??? PR ERCP,W/REMOVAL STONE,BIL/PANCR DUCTS N/A 06/29/2017    Procedure: ERCP; W/ENDOSCOPIC RETROGRADE REMOVAL OF CALCULUS/CALCULI FROM BILIARY &/OR PANCREATIC DUCTS;  Surgeon: Vonda Antigua, MD;  Location: GI PROCEDURES MEMOR[facility];  Service: Gastroenterology   ??? PR  ESOPHAGOSCP RIG TRANSORAL HYPOPHARYNX CRV ESOPH N/A 04/04/2018    Procedure: ESOPHAGOSCOPY, RIGID, TRANSORAL WITH DIVERTICULECTOMY OF HYPOPHARYNX OR CERVICAL ESOPHAGUS, WITH CRICOPHARYNGEAL MYOTOMY, INCLUDES USE OF TELESCOPE OR OPERATING MICROSCOPE AND REPAIR, WHEN PERFORMED;  Surgeon: Vista Deck, MD;  Location: MAIN OR [facility];  Service: ENT   ??? PR INSER HEART TEMP PACER ONE CHMBR N/A 10/02/2016    Procedure: Tempoarary Pacemaker Insertion;  Surgeon: Meredith Leeds, MD;  [facility];  Service: Cardiology   ??? PR LAP,DIAGNOSTIC ABDOMEN N/A 01/22/2016    Procedure: Laparoscopy, Abdomen, Peritoneum, & Omentum, Diagnostic, W/Wo Collection Specimen(S) By Brushing Or Washing;  Surgeon: Particia Nearing, MD;  Location: MAIN OR [facility];  Service: Transplant   ??? PR LARYNGOPLASTY MEDIALIZATION UNLIATERAL N/A 11/07/2018    Procedure: R31 LARYNGOPLASTY, MEDIALIZATION, UNILATERAL;  Surgeon: Vista Deck, MD;  Location: MAIN OR [facility];  Service: ENT   ??? PR PLACE PERCUT GASTROSTOMY TUBE N/A 11/17/2016    Procedure: UGI ENDO; W/DIRECTED PLCMT PERQ GASTROSTOMY TUBE;  Surgeon: Cletis Athens, MD;  Location: GI PROCEDURES MEMOR[facility];  Service: Gastroenterology   ??? PR TRACHEOSTOMY, PLANNED N/A 09/29/2016    Procedure: TRACHEOSTOMY PLANNED (SEPART PROC);  Surgeon: Katherina Mires, MD;  Location: MAIN OR [facility];  Service: Trauma   ??? PR TRANSCATH INSERT OR REPLACE LEADLESS PM VENTR N/A 10/11/2016    Procedure: Pacemaker Implant/Replace Leadless;  Surgeon: Meredith Leeds, MD;  Location: [facility] EP;  Service: Cardiology   ??? PR TRANSPLANT LIVER,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    Procedure: Liver Allotransplantation; Orthotopic, Partial Or Whole, From Cadaver Or Living Donor, Any Age;  Surgeon: Doyce Loose, MD;  Location: MAIN OR [facility]  Service: Transplant   ??? PR TRANSPLANT,PREP DONOR LIVER, WHOLE N/A 09/15/2016    Procedure: Rogelia Boga Std Prep Cad Donor Whole Liver Gft Prior Tnsplnt,Inc Chole,Diss/Rem Surr Tissu Wo Triseg/Lobe Splt;  Surgeon: Doyce Loose, MD;  Location: MAIN OR [facility];  Service: Transplant   ??? PR UPPER GI ENDOSCOPY,BIOPSY N/A 07/17/2012    Procedure: UGI ENDOSCOPY; WITH BIOPSY, SINGLE OR MULTIPLE;  Surgeon: Alba Destine, MD;  Location: GI PROCEDURES [facility];  Service: Gastroenterology   ??? PR UPPER GI ENDOSCOPY,DIAGNOSIS N/A 02/04/2014    Procedure: UGI ENDO, INCLUDE ESOPHAGUS, STOMACH, & DUODENUM &/OR JEJUNUM; DX W/WO COLLECTION SPECIMN, BY BRUSH OR WASH;  Surgeon: Wilburt Finlay, MD;  Location: GI PROCEDURES MEMORIAL [facility];  Service: Gastroenterology   ??? PR UPPER GI ENDOSCOPY,LIGAT VARIX N/A 11/05/2013    Procedure: UGI ENDO; W/BAND LIG ESOPH &/OR GASTRIC VARICES;  Surgeon: Wilburt Finlay, MD;  Location: GI PROCEDURES MEMORIAL [facility];  Service: Gastroenterology Family History   Problem Relation Age of Onset   ??? Hypertension Mother    ??? Cancer Mother    ??? Heart disease Mother    ??? Heart disease Father    ??? Cirrhosis Neg Hx    ??? Liver cancer Neg Hx    ??? Anemia Neg Hx    ??? Diabetes Neg Hx    ??? Kidney disease Neg Hx    ??? Obesity Neg Hx    ??? Thyroid disease Neg Hx    ??? Osteoporosis Neg Hx    ??? Coronary artery disease Neg Hx    ??? Anesthesia problems Neg Hx    ??? Basal cell carcinoma Neg Hx    ??? Squamous cell carcinoma Neg Hx        Social History     Socioeconomic History   ??? Marital status: Married  Spouse name: Not on file   ??? Number of children: Not on file   ??? Years of education: Not on file   ??? Highest education level: Not on file   Occupational History   ??? Not on file   Tobacco Use   ??? Smoking status: Never Smoker   ??? Smokeless tobacco: Never Used   ??? Tobacco comment: Smoked in high school for about 5 years.    Substance and Sexual Activity   ??? Alcohol use: No   ??? Drug use: No   ??? Sexual activity: Yes     Partners: Female     Birth control/protection: None   Other Topics Concern   ??? Do you use sunscreen? Not Asked   ??? Tanning bed use? Not Asked   ??? Are you easily burned? Not Asked   ??? Excessive sun exposure? Not Asked   ??? Blistering sunburns? Not Asked   ??? Exercise No   ??? Living Situation No   Social History Narrative   ??? Not on file     Social Determinants of Health     Financial Resource Strain: Not on file   Food Insecurity: Not on file   Transportation Needs: Not on file   Physical Activity: Not on file   Stress: Not on file   Social Connections: Not on file       Review of systems:  General ROS:   Negative except as stated above.    Medication list:  Current Outpatient Medications on File Prior to Visit   Medication Sig Dispense Refill   ??? bisacodyl (DULCOLAX, BISACODYL,) 5 mg EC tablet Take 5 mg by mouth daily as needed.      ??? blood sugar diagnostic Strp Use three (3) times a day before meals. 100 each 7   ??? blood-glucose meter (GLUCOSE MONITORING KIT) kit Use as instructed 1 each 0   ??? chlorthalidone (HYGROTON) 25 MG tablet Take 1/2 tablet (12.5 mg total) by mouth every morning. 30 tablet 2   ??? cholecalciferol, vitamin D3, 125 mcg (5,000 unit) tablet Take 125 mcg by mouth daily.     ??? cycloSPORINE modified (NEORAL) 100 MG capsule Take 2 capsules (200 mg total) in the morning AND 1 capsule (with 3 x 25mg  capsules) nightly. For a total dose of 200 mg in the morning , 175mg  every evening. 270 capsule 3   ??? cycloSPORINE modified (NEORAL) 25 MG capsule Take 3 capsules (75 mg total) by mouth nightly (with a 100mg  capsule). For a total dose of 200 mg in the morning , 175mg  every evening 270 capsule 3   ??? DULoxetine (CYMBALTA) 30 MG capsule Take 1 capsule (30 mg total) by mouth nightly. 30 capsule 11   ??? magnesium oxide-Mg AA chelate (MAGNESIUM, AMINO ACID CHELATE,) 133 mg Tab Take 2 tablets by mouth Two (2) times a day. 120 tablet PRN   ??? melatonin 3 mg Tab Take 1 tablet (3 mg total) by mouth nightly. 30 tablet 0   ??? mirtazapine (REMERON) 15 MG tablet Take 1 tablet (15 mg total) by mouth nightly. 90 tablet 3   ??? mycophenolate (CELLCEPT) 250 mg capsule Take 2 capsules (500 mg total) by mouth two (2) times a day. 120 capsule 7   ??? pen needle, diabetic (ULTICARE PEN NEEDLE) 32 gauge x 5/32 (4 mm) Ndle USE WITH INSULIN PEN 4 TIMES DAILY AS DIRECTED 100 each 1   ??? predniSONE (DELTASONE) 5 MG tablet Take 4 tablets (20 mg) by mouth  for one day, then take 3 tablets (15 mg) the next day, then take 2 tablets (10 mg) the next day, then take 1 tablet (5mg ) then stop 10 tablet 0   ??? pregabalin (LYRICA) 75 MG capsule Take 1 capsule (75 mg total) by mouth Two (2) times a day. 60 capsule 6   ??? rosuvastatin (CRESTOR) 5 MG tablet Take 1 tablet (5 mg total) by mouth once daily 30 tablet 11   ??? sildenafiL, pulm.hypertension, (REVATIO) 20 mg tablet Take 2 tablets (40 mg total) by mouth Three (3) times a day. 180 tablet 5   ??? sodium bicarbonate 650 mg tablet Take 1 tablet (650 mg total) by mouth two (2) times a day. 180 tablet 3   ??? [DISCONTINUED] gabapentin (NEURONTIN) 100 MG capsule Take 1 capsule (100 mg total) by mouth Three (3) times a day. 270 capsule 3     No current facility-administered medications on file prior to visit.       Physical exam:  Vitals:    05/18/20 1533   BP: 127/98   Pulse: 76   Resp: 18     Wt Readings from Last 3 Encounters:   05/18/20 85.1 kg (187 lb 9.6 oz)   04/27/20 84.5 kg (186 lb 3.2 oz)   04/13/20 82.6 kg (182 lb)     Physical Exam  Constitutional:       General: He is not in acute distress.     Appearance: Normal appearance. He is not ill-appearing, toxic-appearing or diaphoretic.   Cardiovascular:      Rate and Rhythm: Normal rate and regular rhythm.   Pulmonary:      Effort: Pulmonary effort is normal. No respiratory distress.      Breath sounds: Normal breath sounds.   Abdominal:      General: Bowel sounds are normal. There is no distension.      Palpations: Abdomen is soft.      Tenderness: There is no abdominal tenderness.   Neurological:      General: No focal deficit present.      Mental Status: He is alert and oriented to person, place, and time.   Psychiatric:         Mood and Affect: Mood normal.         Behavior: Behavior normal.           Labs reviewed:  Lab Results   Component Value Date    A1C 7.3 (H) 04/27/2020       Lab Results   Component Value Date    NA 139 04/27/2020    K 5.2 (H) 04/27/2020    CL 110 (H) 04/27/2020    CO2 23.1 04/27/2020    BUN 38 (H) 04/27/2020    CREATININE 2.21 (H) 04/27/2020    GFR >= 60 02/15/2012    GLU 146 04/27/2020    CALCIUM 10.2 04/27/2020    ALBUMIN 3.8 04/27/2020    PHOS 2.9 04/27/2020       Lab Results   Component Value Date    TSH 3.833 04/27/2020             Other studies reviewed:  Dexa Bone Density Skeletal  Order: [id number]   Status: Final result ??   Visible to patient: Yes (not seen) ??   Next appt: 05/18/2020 at 03:10 PM in Endocrinology Lequita Halt Lelan Pons, MD) ??   Dx: Liver replaced by transplant (CMS-HCC... ??   0 Result Notes    Details  Reading Physician Reading Date Result Priority   [place] Bayfield Renner, MD  [phone] 09/28/2017      Narrative & Impression  EXAM: DEXA BONE DENSITY SKELETAL  DATE: 09/21/2017 9:03 AM  ACCESSION: [id number]  DICTATED: 09/28/2017 11:37 AM  INTERPRETATION LOCATION: Main Campus  ??  CLINICAL INDICATION: 70 years old Male with a history of glucocorticoid use  ??  TECHNIQUE: Dual energy x-ray absorptiometry was performed assessing the bone mineral density in the left forearm and the proximal left femur using a [employer].    The current examination is compared to a recent measurement dated 11/30/2015 and a baseline measurement dated 06/02/2008.   ??  FINDINGS:   The total bone mineral density in the proximal left femur measures 0.688 gm/cm2.  The Z score is -1.6 and the T score is -2.3.  This represents a significant decrease of 12.2% when compared to the recent measurement of 0.784 gm/cm2 and a significant decrease of 20% when compared with the baseline measurement of 0.860 gm/cm2.  The femoral neck density is 0.570 gm/cm2, and the femoral neck T score is -2.7.  The other T scores scores are -2.2.  ??  The total bone mineral density in the left forearm measures 0.591 gm/cm2.  The Z score is -0.7 and the T score is -1.8.  This represents an insignificant decrease of 3.5% when compared to the recent measurement of 0.613 gm/cm2 and a significant decrease of 6.7% when compared with the baseline measurement of 0.634 gm/cm2.  The other T scores range from -0.1 to -3.0.  ??  IMPRESSION:  Left forearm: Mildly low bone density.  The measurement has decreased significantly since baseline.  Left proximal femur:Low bone density, particularly in the femoral neck.  The measurement has decreased significantly since recent and baseline studies.Marland Kitchen

## 2020-05-19 NOTE — Unmapped (Signed)
Cardiac Implantable Electronic Device Remote Monitoring   ??  Visit Date:?? 05/19/20  ??  Findings: Normal device function, Tested Lead measurements stable and within normal range, Adequate battery reserve->8 years  ??  Arrhythmias: No significant new atrial or ventricular arrhythmias  ??  Plan: Continue Routine Remote Monitoring.     Last Device Clinic: 04/13/2020  ??  Manufacturer of Device:??Medtronic Micra VR TCP [id number]  ??  Type of Device: Leadless Single Chamber Pacemaker  See scanned/downloaded PDF report for model numbers, serial numbers, and date(s) of implant.  ??  Presenting Rhythm:??VS  ??  ______________________________________________________________________  Percentage Biventricular Pacing: n/a  ??  Heart Failure Monitoring: Not Applicable  ______________________________________________________________________  ??  ??  Please see downloaded PDF file of transmission under Media Tab  for full details of device interrogation to include, when applicable,  battery status/charge time, lead trend data, and programmed parameters.

## 2020-05-19 NOTE — Unmapped (Signed)
All appointments with [facility] EP REMOTE MONITORING occur from home.  You do not come in to the office or hospital for these visits.        Your Device data report from this visit can be found in your Mychart by following the steps below.  Step #1-Select Menu then document center    Step #2-Select my documents    Step #3-Select Device checks    Please contact us with any questions.    Thank you,    [facility] EP REMOTE MONITORING [facility]  Phone. [phone]  Fax. [phone]  Email. [email or web address]

## 2020-05-20 NOTE — Unmapped (Signed)
I have reviewed and agree with the above battery, threshold, lead impedance, and overall device/lead status, diagnostic data including arrhythmic events and therapies on the device evaluation.    Katina Remick, MD

## 2020-05-22 ENCOUNTER — Ambulatory Visit
Admit: 2020-05-22 | Discharge: 2020-05-23 | Payer: MEDICARE | Attending: Student in an Organized Health Care Education/Training Program | Primary: Student in an Organized Health Care Education/Training Program

## 2020-05-22 MED ORDER — CREAM BASE NO.171 (BULK)
TOPICAL | 0 refills | 0.00000 days | Status: CN
Start: 2020-05-22 — End: 2020-05-22

## 2020-05-22 MED ORDER — DULOXETINE 30 MG CAPSULE,DELAYED RELEASE
ORAL_CAPSULE | Freq: Every evening | ORAL | 11 refills | 30 days | Status: CP
Start: 2020-05-22 — End: 2021-05-22
  Filled 2020-05-29: qty 60, 30d supply, fill #0

## 2020-05-22 NOTE — Unmapped (Signed)
Outpatient Neurology Consult Note     MMNT [address]  [facility] [place]  [address]  [place] [address]  [phone]    Date: 05/22/2020  Patient Name: Jeffrey Ward  MRN: [id number]  PCP: Duard Brady, MD     Assessment and Plan        Jeffrey Ward is a 70 y.o. male with a past medical history of multilevel lower spine compression fractures s/p repair (1990), right hip fracture, hep C s/p liver transplant, DM, HTN, CKD, chronic LS radiculopathy, afib, L CTS presenting in consultation for evaluation of neuropathy.    Multifactorial peripheral neuropathy: Multiple risk factors for neuropathy including previous critical illness, diabetes, hepatitis C s/p liver transplant, amiodarone therapy. Has tried Lyrica (ineffective, caused dizziness) and gabapentin (ineffective). Has taken Cymbalta many years ago but not sure if it helped.   - Continue Lyrica 75 mg BID  - Increase Cymbalta to 60 mg nightly  - Sent compounded cream (Lidocaine 5%, diclofenac 3%, cyclobenzaprine 2%, gabapentin 2%, ketamine 5%) to be used QID PRN  - Encouraged trial of voltaren gel OTC or aspirin soak foot baths at night if he is unable to get compounded cream  - Could consider low dose naltrexone at next visit if no improvement  - Follow up in 4 months    Reviewed the differential diagnosis, plan of care in detail, as well as other elements as detailed above.  A written summary was provided to the patient, including a current medication and allergy list, problem list and plan of care. In addition, my contact information was provided to the patient, in writing.  All the patient's questions and concerns were addressed to their stated satisfaction .      Return Visit Discussed: Return in about 4 months (around 09/21/2020).    This patient was seen and discussed with Jeffrey Ward who agrees with the above assessment and plan.    I personally spent 40 minutes face-to-face and non-face-to-face in the care of this patient, which includes all pre, intra, and post visit time on the date of service.    Jeffrey Corns MD  Neurology PGY-3         HPI         HPI: Jeffrey Ward is a 70 y.o. male seen in consultation at the [facility] at the request of Jeffrey Ward for evaluation of neuropathy. They are requesting my opinion regarding potential etiologies, diagnostic work-up that should be considered, and/or treatment options entertained.     Since last visit, Jeffrey Ward has been compliant with starting Cymbalta which has been slightly helpful. Pain still wakes him from sleep 2-3 times per week. He has been going [facility] where he is getting TENS unit and phototherapy treatments, which were initially helpful but less so now. He is now off amiodarone as of a few weeks ago.    Neuropathy history:   Jeffrey Ward endorses history of neuropathy. Notes electric shocks in the toes, soles, and ankles. Occasionally notes pain in hands. Symptoms much worse at night. Saw Jeffrey Ward in 2019. Continues to take Lyrica 75 mg BID. Saw chronic pain center in [place] where he got TENS unit and phototherapy which was helpful. Has not tried any other medications other than lidocaine gel (not helpful).     Current treatments: Lyrica 75 mg BID (slightly effective), Cymbalta 30  mg nightly (slightly effective)  Previous treatments: Lyrica 100 mg BID (caused dizziness), gabapentin (ineffective), has been on Cymbalta previously but not sure about efficacy, tried TENS unit and phototherapy at pain management in [place] (effective but too expensive), lidocaine gel (ineffective)    Pertinent imaging/workup:  MRI lumbar spine 02/12/2018:   --Extensive postoperative changes as above with old stable compression fracture at T12.  --Stable grade 2 anterolisthesis of L5 on S1 with associated mild left foraminal and central canal/subarticular zone stenosis.  --Poor visualization at the level of L2-3 as above secondary to metal artifact.  EMG/NCS 12/26/2016:  Abnormal study. These electrodiagnostic findings show evidence of a mild   axonal sensorimotor polyneuropathy, a severe left median neuropathy at the   wrist, and a probable superimposed left lumbosacral radiculopathy.     Pertinent surgical/medical history:  T12 compression fracture s/p repair (1990)  Right hip fracture ~1 year ago  Hepatitis C s/p liver transplant 2018  DM (diagnosed a few years ago)  HTN  LS radiculopathy  CKD  Afib    Social history:  Denies alcohol  Denies tobacco  Denies illicit/rec drugs  Retired, Art gallery manager    No Known Allergies     Current Outpatient Medications   Medication Sig Dispense Refill   ??? bisacodyl (DULCOLAX, BISACODYL,) 5 mg EC tablet Take 5 mg by mouth daily as needed.      ??? blood sugar diagnostic Strp Use three (3) times a day before meals. 100 each 7   ??? blood-glucose meter (GLUCOSE MONITORING KIT) kit Use as instructed 1 each 0   ??? chlorthalidone (HYGROTON) 25 MG tablet Take 1/2 tablet (12.5 mg total) by mouth every morning. 30 tablet 2   ??? cholecalciferol, vitamin D3, 125 mcg (5,000 unit) tablet Take 125 mcg by mouth daily.     ??? cycloSPORINE modified (NEORAL) 100 MG capsule Take 2 capsules (200 mg total) in the morning AND 1 capsule (with 3 x 25mg  capsules) nightly. For a total dose of 200 mg in the morning , 175mg  every evening. 270 capsule 3   ??? cycloSPORINE modified (NEORAL) 25 MG capsule Take 3 capsules (75 mg total) by mouth nightly (with a 100mg  capsule). For a total dose of 200 mg in the morning , 175mg  every evening 270 capsule 3   ??? DULoxetine (CYMBALTA) 30 MG capsule Take 2 capsules (60 mg total) by mouth nightly. 60 capsule 11   ??? magnesium oxide-Mg AA chelate (MAGNESIUM, AMINO ACID CHELATE,) 133 mg Tab Take 2 tablets by mouth Two (2) times a day. 120 tablet PRN   ??? melatonin 3 mg Tab Take 1 tablet (3 mg total) by mouth nightly. 30 tablet 0   ??? mirtazapine (REMERON) 15 MG tablet Take 1 tablet (15 mg total) by mouth nightly. 90 tablet 3   ??? mycophenolate (CELLCEPT) 250 mg capsule Take 2 capsules (500 mg total) by mouth two (2) times a day. 120 capsule 7   ??? pen needle, diabetic (ULTICARE PEN NEEDLE) 32 gauge x 5/32 (4 mm) Ndle USE WITH INSULIN PEN 4 TIMES DAILY AS DIRECTED 100 each 1   ??? predniSONE (DELTASONE) 5 MG tablet Take 4 tablets (20 mg) by mouth for one day, then take 3 tablets (15 mg) the next day, then take 2 tablets (10 mg) the next day, then take 1 tablet (5mg ) then stop 10 tablet 0   ??? pregabalin (LYRICA) 75 MG capsule Take 1 capsule (75 mg total) by mouth Two (2) times a day. 60 capsule  6   ??? rosuvastatin (CRESTOR) 5 MG tablet Take 1 tablet (5 mg total) by mouth once daily 30 tablet 11   ??? semaglutide (OZEMPIC) 1 mg/dose (4 mg/3 mL) PnIj injection Inject 1 mg under the skin every seven (7) days. 9 mL 3   ??? sildenafiL, pulm.hypertension, (REVATIO) 20 mg tablet Take 2 tablets (40 mg total) by mouth Three (3) times a day. 180 tablet 5   ??? sodium bicarbonate 650 mg tablet Take 1 tablet (650 mg total) by mouth two (2) times a day. 180 tablet 3   ??? cream base no.171, bulk, Crea Apply to affected area as needed up to 4 times daily 60 g 0     No current facility-administered medications for this visit.       Past Medical History:   Diagnosis Date   ??? Atrial fibrillation (CMS-HCC)    ??? Basal cell carcinoma    ??? Cancer (CMS-HCC)    ??? Cirrhosis (CMS-HCC)    ??? Depression    ??? Diabetes (CMS-HCC)    ??? Headache    ??? Hepatitis C 07/17/2012   ??? History of transfusion    ??? Liver disease    ??? Low back pain 07/17/2012   ??? Varices, esophageal (CMS-HCC)        Past Surgical History:   Procedure Laterality Date   ??? ANKLE SURGERY     ??? BACK SURGERY     ??? BACK SURGERY     ??? CARDIAC SURGERY      pacemaker   ??? CHG US GUIDE, TISSUE ABLATION N/A 01/22/2016    Procedure: ULTRASOUND GUIDANCE FOR, AND MONITORING OF, PARENCHYMAL TISSUE ABLATION;  Surgeon: Particia Nearing, MD;  Location: MAIN OR [facility];  Service: Transplant   ??? IR EMBOLIZATION ORGAN ISCHEMIA, TUMORS, INFAR  06/16/2016    IR EMBOLIZATION ORGAN ISCHEMIA, TUMORS, INFAR 06/16/2016 Ammie Dalton, MD IMG VIR H&V [facility]   ??? PR COLON CA SCRN NOT HI RSK IND N/A 02/27/2015    Procedure: COLOREC CNCR SCR;COLNSCPY NO;  Surgeon: Vonda Antigua, MD;  Location: GI PROCEDURES [facility]ORIAL [facility];  Service: Gastroenterology   ??? PR ENDOSCOPIC ULTRASOUND EXAM N/A 02/27/2015    Procedure: UGI ENDO; W/ENDO ULTRASOUND EXAM INCLUDES ESOPHAGUS, STOMACH, &/OR DUODENUM/JEJUNUM;  Surgeon: Vonda Antigua, MD;  Location: GI PROCEDURES MEMORIAL [facility];  Service: Gastroenterology   ??? PR ERCP BALLOON DILATE BILIARY/PANC DUCT/AMPULLA EA N/A 02/10/2017    Procedure: ERCP;WITH TRANS-ENDOSCOPIC BALLOON DILATION OF BILIARY/PANCREATIC DUCT(S) OR OF AMPULLA, INCLUDING SPHINCTERECTOMY, WHEN PERFOREMD,EACH DUCT ([address]);  Surgeon: Mayford Knife, MD;  Location: GI PROCEDURES MEMORIAL [facility];  Service: Gastroenterology   ??? PR ERCP REMOVE FOREIGN BODY/STENT BILIARY/PANC DUCT N/A 02/10/2017    Procedure: ENDOSCOPIC RETROGRADE CHOLANGIOPANCREATOGRAPHY (ERCP); W/ REMOVAL OF FOREIGN BODY/STENT FROM BILIARY/PANCREATIC DUCT(S);  Surgeon: Mayford Knife, MD;  Location: GI PROCEDURES MEMORIAL [facility];  Service: Gastroenterology   ??? PR ERCP REMOVE FOREIGN BODY/STENT BILIARY/PANC DUCT N/A 06/29/2017    Procedure: ENDOSCOPIC RETROGRADE CHOLANGIOPANCREATOGRAPHY (ERCP); W/ REMOVAL OF FOREIGN BODY/STENT FROM BILIARY/PANCREATIC DUCT(S);  Surgeon: Vonda Antigua, MD;  Location: GI PROCEDURES MEMORIAL [facility];  Service: Gastroenterology   ??? PR ERCP STENT PLACEMENT BILIARY/PANCREATIC DUCT N/A 11/29/2016    Procedure: ENDOSCOPIC RETROGRADE CHOLANGIOPANCREATOGRAPHY (ERCP); WITH PLACEMENT OF ENDOSCOPIC STENT INTO BILIARY OR PANCREATIC DUCT;  Surgeon: Chriss Driver, MD;  Location: GI PROCEDURES MEMORIAL [facility];  Service: Gastroenterology   ??? PR ERCP STENT PLACEMENT BILIARY/PANCREATIC DUCT N/A 04/26/2017    Procedure: ENDOSCOPIC RETROGRADE CHOLANGIOPANCREATOGRAPHY (ERCP); WITH PLACEMENT  OF ENDOSCOPIC STENT INTO BILIARY OR PANCREATIC DUCT;  Surgeon: Mayford Knife, MD;  Location: GI PROCEDURES MEMORIAL [facility];  Service: Gastroenterology   ??? PR ERCP,W/REMOVAL STONE,BIL/PANCR DUCTS N/A 11/29/2016    Procedure: ERCP; W/ENDOSCOPIC RETROGRADE REMOVAL OF CALCULUS/CALCULI FROM BILIARY &/OR PANCREATIC DUCTS;  Surgeon: Chriss Driver, MD;  Location: GI PROCEDURES MEMORIAL [facility];  Service: Gastroenterology   ??? PR ERCP,W/REMOVAL STONE,BIL/PANCR DUCTS N/A 02/10/2017    Procedure: ERCP; W/ENDOSCOPIC RETROGRADE REMOVAL OF CALCULUS/CALCULI FROM BILIARY &/OR PANCREATIC DUCTS;  Surgeon: Mayford Knife, MD;  Location: GI PROCEDURES MEMORIAL [facility];  Service: Gastroenterology   ??? PR ERCP,W/REMOVAL STONE,BIL/PANCR DUCTS N/A 04/26/2017    Procedure: ERCP; W/ENDOSCOPIC RETROGRADE REMOVAL OF CALCULUS/CALCULI FROM BILIARY &/OR PANCREATIC DUCTS;  Surgeon: Mayford Knife, MD;  Location: GI PROCEDURES MEMORIAL [facility];  Service: Gastroenterology   ??? PR ERCP,W/REMOVAL STONE,BIL/PANCR DUCTS N/A 06/29/2017    Procedure: ERCP; W/ENDOSCOPIC RETROGRADE REMOVAL OF CALCULUS/CALCULI FROM BILIARY &/OR PANCREATIC DUCTS;  Surgeon: Vonda Antigua, MD;  Location: GI PROCEDURES MEMORIAL [facility];  Service: Gastroenterology   ??? PR ESOPHAGOSCP RIG TRANSORAL HYPOPHARYNX CRV ESOPH N/A 04/04/2018    Procedure: ESOPHAGOSCOPY, RIGID, TRANSORAL WITH DIVERTICULECTOMY OF HYPOPHARYNX OR CERVICAL ESOPHAGUS, WITH CRICOPHARYNGEAL MYOTOMY, INCLUDES USE OF TELESCOPE OR OPERATING MICROSCOPE AND REPAIR, WHEN PERFORMED;  Surgeon: Vista Deck, MD;  Location: MAIN OR [facility];  Service: ENT   ??? PR INSER HEART TEMP PACER ONE CHMBR N/A 10/02/2016    Procedure: Tempoarary Pacemaker Insertion;  Surgeon: Meredith Leeds, MD;  Location: [facility];  Service: Cardiology   ??? PR LAP,DIAGNOSTIC ABDOMEN N/A 01/22/2016    Procedure: Laparoscopy, Abdomen, Peritoneum, & Omentum, Diagnostic, W/Wo Collection Specimen(S) By Brushing Or Washing;  Surgeon: Particia Nearing, MD;  Location: MAIN OR [facility];  Service: Transplant   ??? PR LARYNGOPLASTY MEDIALIZATION UNLIATERAL N/A 11/07/2018    Procedure: R31 LARYNGOPLASTY, MEDIALIZATION, UNILATERAL;  Surgeon: Vista Deck, MD;  Location: MAIN OR [facility];  Service: ENT   ??? PR PLACE PERCUT GASTROSTOMY TUBE N/A 11/17/2016    Procedure: UGI ENDO; W/DIRECTED PLCMT PERQ GASTROSTOMY TUBE;  Surgeon: Cletis Athens, MD;  Location: GI PROCEDURES MEMORIAL [facility];  Service: Gastroenterology   ??? PR TRACHEOSTOMY, PLANNED N/A 09/29/2016    Procedure: TRACHEOSTOMY PLANNED (SEPART PROC);  Surgeon: Katherina Mires, MD;  Location: MAIN OR [facility];  Service: Trauma   ??? PR TRANSCATH INSERT OR REPLACE LEADLESS PM VENTR N/A 10/11/2016    Procedure: Pacemaker Implant/Replace Leadless;  Surgeon: Meredith Leeds, MD;  Location: [facility];  Service: Cardiology   ??? PR TRANSPLANT LIVER,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    Procedure: Liver Allotransplantation; Orthotopic, Partial Or Whole, From Cadaver Or Living Donor, Any Age;  Surgeon: Doyce Loose, MD;  Location: MAIN OR [facility];  Service: Transplant   ??? PR TRANSPLANT,PREP DONOR LIVER, WHOLE N/A 09/15/2016    Procedure: Rogelia Boga Std Prep Cad Donor Whole Liver Gft Prior Tnsplnt,Inc Chole,Diss/Rem Surr Tissu Wo Triseg/Lobe Splt;  Surgeon: Doyce Loose, MD;  Location: MAIN OR [facility];  Service: Transplant   ??? PR UPPER GI ENDOSCOPY,BIOPSY N/A 07/17/2012    Procedure: UGI ENDOSCOPY; WITH BIOPSY, SINGLE OR MULTIPLE;  Surgeon: Alba Destine, MD;  Location: GI PROCEDURES MEMORIAL [facility];  Service: Gastroenterology   ??? PR UPPER GI ENDOSCOPY,DIAGNOSIS N/A 02/04/2014    Procedure: UGI ENDO, INCLUDE ESOPHAGUS, STOMACH, & DUODENUM &/OR JEJUNUM; DX W/WO COLLECTION SPECIMN, BY BRUSH OR WASH;  Surgeon: Wilburt Finlay, MD;  Location: GI PROCEDURES MEMORIAL [facility];  Service: Gastroenterology   ???  PR UPPER GI ENDOSCOPY,LIGAT VARIX N/A 11/05/2013    Procedure: UGI ENDO; W/BAND LIG ESOPH &/OR GASTRIC VARICES;  Surgeon: Wilburt Finlay, MD;  Location: GI PROCEDURES [facility]OR[facility];  Service: Gastroenterology       Social History     Socioeconomic History   ??? Marital status: Married     Spouse name: None   ??? Number of children: None   ??? Years of education: None   ??? Highest education level: None   Occupational History   ??? None   Tobacco Use   ??? Smoking status: Never Smoker   ??? Smokeless tobacco: Never Used   ??? Tobacco comment: Smoked in high school for about 5 years.    Substance and Sexual Activity   ??? Alcohol use: No   ??? Drug use: No   ??? Sexual activity: Yes     Partners: Female     Birth control/protection: None   Other Topics Concern   ??? Do you use sunscreen? Not Asked   ??? Tanning bed use? Not Asked   ??? Are you easily burned? Not Asked   ??? Excessive sun exposure? Not Asked   ??? Blistering sunburns? Not Asked   ??? Exercise No   ??? Living Situation No   Social History Narrative   ??? None     Social Determinants of Health     Financial Resource Strain: Not on file   Food Insecurity: Not on file   Transportation Needs: Not on file   Physical Activity: Not on file   Stress: Not on file   Social Connections: Not on file       Family History   Problem Relation Age of Onset   ??? Hypertension Mother    ??? Cancer Mother    ??? Heart disease Mother    ??? Heart disease Father    ??? Cirrhosis Neg Hx    ??? Liver cancer Neg Hx    ??? Anemia Neg Hx    ??? Diabetes Neg Hx    ??? Kidney disease Neg Hx    ??? Obesity Neg Hx    ??? Thyroid disease Neg Hx    ??? Osteoporosis Neg Hx    ??? Coronary artery disease Neg Hx    ??? Anesthesia problems Neg Hx    ??? Basal cell carcinoma Neg Hx    ??? Squamous cell carcinoma Neg Hx         Review of Systems     A 10-system review of systems was conducted and was negative except as documented above in the HPI.       Objective        Vital signs: BP 138/96 (BP Site: R Arm, BP Position: Sitting, BP Cuff Size: Large)  - Pulse 83 - Ht 175.3 cm (5' 9.02)  - Wt 82.6 kg (182 lb)  - BMI 26.86 kg/m??      Physical Exam:  General Appearance:Chronically ill appearing. In no acute distress. Overweight.  HEENT: Head is atraumatic and normocephalic. Sclera anicteric without injection. Oropharyngeal membranes are moist with no erythema or exudate.  Neck: Supple.  Lungs: No increased work of breathing  Heart: Regular rate and rhythm.  Abdomen: Soft, nontender, nondistended.  Extremities: Mild pitting edema of the BLE.    Neurological Examination:     Mental Status: Alert, conversant, able to follow conversation and interview. Spontaneous speech was fluent without word finding pauses, dysarthria, or paraphasic errors. Comprehension was intact to simple and multi-step commands. Memory  for recent and remote events was intact.    Cranial Nerves: PERRL. Pursuit eye movements were uninterrupted with full range and without more than end-gaze nystagmus. Facial sensation intact bilaterally to light touch on the forehead, cheek, and chin. Face symmetric at rest. Normal facial movement bilaterally, including forehead, eye closure and grimace/smile. Hearing intact to conversation. Shoulder shrug full strength bilaterally. Palate movement is symmetric. Tongue protrudes midline and tongue movements are normal.    Motor Exam: Normal bulk.  No tremors, myoclonus, or other adventitious movement.  5/5 BUE  4-/5 R hip flexors (pain limited, prior hip surgery), 4+/5 L hip flexors, 5/5 bilateral knee flexors/extensors, 5/5 dorsi/plantarflexion, 0/5 R EHL (chronic), 5/5 L EHL      Reflexes:   R L   Biceps +2 +2   Brachioradialis +2 +2   Triceps +2 +2   Patella +2 +2   Achilles +2 +2   Toes are downgoing bilaterally.    Sensory: Diminished cold temperature sensation from the mid-shin. Absent vibratory sensation at the toes. Diminished vibratory sensation at the ankles. Impaired proprioception at the right toes, preserved on the left.    Cerebellar/Coordination/Gait: Finger-to-nose is normal without ataxia or dysmetria bilaterally. Heel-to-shin is normal without ataxia or dysmetria bilaterally. Gait exam demonstrates antalgic gait favoring right leg. Romberg positive.         Diagnostic Studies and Review of Records   See HPI

## 2020-05-22 NOTE — Unmapped (Signed)
You were evaluated at the [facility] clinic for neuropathy. Based on our discussion, we will increase your Cymbalta to 60 mg nightly and also try to send you with a compounded cream which you can use up to 4 times daily. If we are unable to get you the cream, you can get Voltaren gel at your local pharmacy which you can use on your legs up to 4 times daily. If you do not get relief with that, you can aspirin soaks at night by breaking 3 chewable aspirin tablets into a water bath. We will have you follow up in 4 months. Please send me a message in 1-2 months to let me know how your pain is doing.

## 2020-06-08 NOTE — Unmapped (Signed)
[facility] Refill Coordination Note    Specialty Medication(s) to be Shipped:   Transplant: mycophenolate mofetil 250mg  and cyclosporine 25mg     Other medication(s) to be shipped: mag 133mg , rosuvasatin, sildenfil, sodium bicarb     Jeffrey Ward, DOB: 1951-02-10  Phone: [phone] (home)       All above HIPAA information was verified with patient.     Was a Nurse, learning disability used for this call? No    Completed refill call assessment today to schedule patient's medication shipment from the [facility] [phone]).       Specialty medication(s) and dose(s) confirmed: Regimen is correct and unchanged.   Changes to medications: Jeffrey Ward reports no changes at this time.  Changes to insurance: No  Questions for the pharmacist: No    Confirmed patient received a Conservation officer, historic buildings and a Surveyor, mining with first shipment. The patient will receive a drug information handout for each medication shipped and additional FDA Medication Guides as required.       DISEASE/MEDICATION-SPECIFIC INFORMATION        N/A    SPECIALTY MEDICATION ADHERENCE     Medication Adherence    Patient reported X missed doses in the last month: 0  Specialty Medication: cycloSPORINE modified (NEORAL) 25 MG capsule  Patient is on additional specialty medications: Yes  Additional Specialty Medications: mycophenolate (CELLCEPT) 250 mg capsule  Support network for adherence: family member        mycophenolate mofetil 250mg  8 days worth of medication on hand.  cyclosporine 25mg   8 days worth of medication on hand.                SHIPPING     Shipping address confirmed in Epic.     Delivery Scheduled: Yes, Expected medication delivery date: 06/10/20.     Medication will be delivered via Next Day Courier to the prescription address in Epic WAM.    [name]   [facility] Specialty Technician

## 2020-06-09 MED FILL — MYCOPHENOLATE MOFETIL 250 MG CAPSULE: ORAL | 30 days supply | Qty: 120 | Fill #2

## 2020-06-09 MED FILL — SODIUM BICARBONATE 650 MG TABLET: ORAL | 90 days supply | Qty: 180 | Fill #1

## 2020-06-09 MED FILL — SILDENAFIL (PULMONARY HYPERTENSION) 20 MG TABLET: ORAL | 30 days supply | Qty: 180 | Fill #5

## 2020-06-09 MED FILL — ROSUVASTATIN 5 MG TABLET: 30 days supply | Qty: 30 | Fill #10

## 2020-06-09 MED FILL — CYCLOSPORINE MODIFIED 25 MG CAPSULE: ORAL | 30 days supply | Qty: 90 | Fill #2

## 2020-06-09 MED FILL — MG-PLUS-PROTEIN 133 MG TABLET: ORAL | 30 days supply | Qty: 120 | Fill #3

## 2020-06-18 ENCOUNTER — Ambulatory Visit: Admit: 2020-06-18 | Discharge: 2020-06-19 | Payer: MEDICARE

## 2020-06-18 ENCOUNTER — Other Ambulatory Visit: Payer: Self-pay

## 2020-06-18 ENCOUNTER — Ambulatory Visit
Admission: RE | Admit: 2020-06-18 | Discharge: 2020-06-18 | Disposition: A | Discharge status: Home / Self Care | Payer: Medicare Other | Source: Ambulatory Visit | Attending: Family Medicine | Admitting: Family Medicine

## 2020-06-18 ENCOUNTER — Other Ambulatory Visit: Payer: Self-pay | Admitting: Family Medicine

## 2020-06-18 DIAGNOSIS — T185XXA Foreign body in anus and rectum, initial encounter: Secondary | ICD-10-CM | POA: Diagnosis not present

## 2020-06-21 NOTE — Unmapped (Signed)
Addended by: Lucretia Field D on: 06/20/2020 06:32 PM     Modules accepted: Level of Service

## 2020-07-09 MED ORDER — SILDENAFIL (PULMONARY HYPERTENSION) 20 MG TABLET
ORAL_TABLET | Freq: Three times a day (TID) | ORAL | 2 refills | 30 days | Status: CP
Start: 2020-07-09 — End: ?

## 2020-07-09 MED ORDER — ROSUVASTATIN 5 MG TABLET
ORAL_TABLET | 11 refills | 0.00000 days
Start: 2020-07-09 — End: ?

## 2020-07-09 NOTE — Unmapped (Signed)
Pt needs a follow up with Dr. Bryson Corona please. Thanks

## 2020-07-09 NOTE — Unmapped (Signed)
[facility] Refill Coordination Note    Specialty Medication(s) to be Shipped:   Transplant: mycophenolate mofetil 250mg , cyclosporine 100mg  and cyclosporine 25mg     Other medication(s) to be shipped:      Mag 133mg   Rosuvastatin  Sildenafil  Chlorthalidone  Duloxetine  ozempic       Jeffrey Ward, DOB: 11-27-1950  Phone: [phone] (home)       All above HIPAA information was verified with patient.     Was a Nurse, learning disability used for this call? No    Completed refill call assessment today to schedule patient's medication shipment from the [facility] [phone]).  All relevant notes have been reviewed.     Specialty medication(s) and dose(s) confirmed: Regimen is correct and unchanged.   Changes to medications: Jeffrey Ward reports no changes at this time.  Changes to insurance: No  New side effects reported not previously addressed with a pharmacist or physician: None reported  Questions for the pharmacist: No    Confirmed patient received a Conservation officer, historic buildings and a Surveyor, mining with first shipment. The patient will receive a drug information handout for each medication shipped and additional FDA Medication Guides as required.       DISEASE/MEDICATION-SPECIFIC INFORMATION        N/A    SPECIALTY MEDICATION ADHERENCE     Medication Adherence    Patient reported X missed doses in the last month: 0  Specialty Medication: cycloSPORINE modified (NEORAL) 25 MG capsule  Patient is on additional specialty medications: Yes  Additional Specialty Medications: cycloSPORINE modified (NEORAL) 100 MG capsule  Patient Reported Additional Medication X Missed Doses in the Last Month: 0  Patient is on more than two specialty medications: Yes  Specialty Medication: mycophenolate (CELLCEPT) 250 mg capsule  Patient Reported Additional Medication X Missed Doses in the Last Month: 0  Support network for adherence: family member        mycophenolate mofetil 250mg  8 days worth of medication on hand. cyclosporine 25mg  8 days worth of medication on hand.   cyclosporine 100mg  8 days worth of medication on hand.    Were doses missed due to medication being on hold? No        REFERRAL TO PHARMACIST     Referral to the pharmacist: Not needed      [facility]     Shipping address confirmed in Epic.     Delivery Scheduled: Yes, Expected medication delivery date: 07/13/20.     Medication will be delivered via Same Day Courier to the prescription address in Epic WAM.    Jeffrey Ward   [facility] Specialty Technician

## 2020-07-10 ENCOUNTER — Emergency Department: Payer: Medicare Other

## 2020-07-10 ENCOUNTER — Encounter: Payer: Self-pay | Admitting: Radiology

## 2020-07-10 ENCOUNTER — Inpatient Hospital Stay
Admission: EM | Admit: 2020-07-10 | Discharge: 2020-07-20 | DRG: 208 | Disposition: A | Discharge status: Home Health | Payer: Medicare Other | Attending: Internal Medicine | Admitting: Internal Medicine

## 2020-07-10 DIAGNOSIS — Z944 Liver transplant status: Secondary | ICD-10-CM

## 2020-07-10 DIAGNOSIS — R140 Abdominal distension (gaseous): Secondary | ICD-10-CM

## 2020-07-10 DIAGNOSIS — G9341 Metabolic encephalopathy: Secondary | ICD-10-CM | POA: Diagnosis present

## 2020-07-10 DIAGNOSIS — J9600 Acute respiratory failure, unspecified whether with hypoxia or hypercapnia: Secondary | ICD-10-CM | POA: Diagnosis not present

## 2020-07-10 DIAGNOSIS — Z7983 Long term (current) use of bisphosphonates: Secondary | ICD-10-CM

## 2020-07-10 DIAGNOSIS — I4891 Unspecified atrial fibrillation: Secondary | ICD-10-CM

## 2020-07-10 DIAGNOSIS — I5020 Unspecified systolic (congestive) heart failure: Secondary | ICD-10-CM

## 2020-07-10 DIAGNOSIS — I5021 Acute systolic (congestive) heart failure: Secondary | ICD-10-CM | POA: Diagnosis present

## 2020-07-10 DIAGNOSIS — E785 Hyperlipidemia, unspecified: Secondary | ICD-10-CM | POA: Diagnosis present

## 2020-07-10 DIAGNOSIS — E1100 Type 2 diabetes mellitus with hyperosmolarity without nonketotic hyperglycemic-hyperosmolar coma (NKHHC): Secondary | ICD-10-CM | POA: Diagnosis present

## 2020-07-10 DIAGNOSIS — E1111 Type 2 diabetes mellitus with ketoacidosis with coma: Secondary | ICD-10-CM

## 2020-07-10 DIAGNOSIS — I959 Hypotension, unspecified: Secondary | ICD-10-CM | POA: Diagnosis present

## 2020-07-10 DIAGNOSIS — Z794 Long term (current) use of insulin: Secondary | ICD-10-CM

## 2020-07-10 DIAGNOSIS — I495 Sick sinus syndrome: Secondary | ICD-10-CM | POA: Diagnosis present

## 2020-07-10 DIAGNOSIS — R188 Other ascites: Secondary | ICD-10-CM

## 2020-07-10 DIAGNOSIS — Z20822 Contact with and (suspected) exposure to covid-19: Secondary | ICD-10-CM | POA: Diagnosis present

## 2020-07-10 DIAGNOSIS — N184 Chronic kidney disease, stage 4 (severe): Secondary | ICD-10-CM | POA: Diagnosis present

## 2020-07-10 DIAGNOSIS — R112 Nausea with vomiting, unspecified: Secondary | ICD-10-CM

## 2020-07-10 DIAGNOSIS — I429 Cardiomyopathy, unspecified: Secondary | ICD-10-CM | POA: Diagnosis present

## 2020-07-10 DIAGNOSIS — E876 Hypokalemia: Secondary | ICD-10-CM | POA: Diagnosis present

## 2020-07-10 DIAGNOSIS — E1110 Type 2 diabetes mellitus with ketoacidosis without coma: Secondary | ICD-10-CM | POA: Diagnosis present

## 2020-07-10 DIAGNOSIS — J9601 Acute respiratory failure with hypoxia: Principal | ICD-10-CM | POA: Diagnosis present

## 2020-07-10 DIAGNOSIS — Z452 Encounter for adjustment and management of vascular access device: Secondary | ICD-10-CM

## 2020-07-10 DIAGNOSIS — N400 Enlarged prostate without lower urinary tract symptoms: Secondary | ICD-10-CM | POA: Diagnosis present

## 2020-07-10 DIAGNOSIS — Z8505 Personal history of malignant neoplasm of liver: Secondary | ICD-10-CM

## 2020-07-10 DIAGNOSIS — I2721 Secondary pulmonary arterial hypertension: Secondary | ICD-10-CM | POA: Diagnosis present

## 2020-07-10 DIAGNOSIS — I130 Hypertensive heart and chronic kidney disease with heart failure and stage 1 through stage 4 chronic kidney disease, or unspecified chronic kidney disease: Secondary | ICD-10-CM | POA: Diagnosis present

## 2020-07-10 DIAGNOSIS — N179 Acute kidney failure, unspecified: Secondary | ICD-10-CM | POA: Diagnosis present

## 2020-07-10 DIAGNOSIS — Z79899 Other long term (current) drug therapy: Secondary | ICD-10-CM

## 2020-07-10 DIAGNOSIS — I480 Paroxysmal atrial fibrillation: Secondary | ICD-10-CM | POA: Diagnosis present

## 2020-07-10 DIAGNOSIS — Z7982 Long term (current) use of aspirin: Secondary | ICD-10-CM

## 2020-07-10 DIAGNOSIS — E1122 Type 2 diabetes mellitus with diabetic chronic kidney disease: Secondary | ICD-10-CM | POA: Diagnosis present

## 2020-07-10 DIAGNOSIS — K219 Gastro-esophageal reflux disease without esophagitis: Secondary | ICD-10-CM | POA: Diagnosis present

## 2020-07-10 DIAGNOSIS — R4182 Altered mental status, unspecified: Secondary | ICD-10-CM

## 2020-07-10 DIAGNOSIS — Z950 Presence of cardiac pacemaker: Secondary | ICD-10-CM

## 2020-07-10 DIAGNOSIS — D696 Thrombocytopenia, unspecified: Secondary | ICD-10-CM | POA: Diagnosis present

## 2020-07-10 LAB — URINALYSIS, COMPLETE (UACMP) WITH MICROSCOPIC
Bacteria, UA: NONE SEEN
Bilirubin Urine: NEGATIVE
Glucose, UA: >=500 mg/dL — AB
Ketones, ur: 80 mg/dL — AB
Leukocytes,Ua: NEGATIVE
Nitrite: NEGATIVE
Protein, ur: 30 mg/dL — AB
Specific Gravity, Urine: 1.021 (ref 1.005–1.030)
Squamous Epithelial / HPF: NONE SEEN (ref 0–5)
pH: 5 (ref 5.0–8.0)

## 2020-07-10 LAB — DIFFERENTIAL
Abs Immature Granulocytes: 0.08 10*3/uL — ABNORMAL HIGH (ref 0.00–0.07)
Basophils Absolute: 0 10*3/uL (ref 0.0–0.1)
Basophils Relative: 0 %
Eosinophils Absolute: 0 10*3/uL (ref 0.0–0.5)
Eosinophils Relative: 0 %
Immature Granulocytes: 1 %
Lymphocytes Relative: 4 %
Lymphs Abs: 0.5 10*3/uL — ABNORMAL LOW (ref 0.7–4.0)
Monocytes Absolute: 0.7 10*3/uL (ref 0.1–1.0)
Monocytes Relative: 5 %
Neutro Abs: 12.3 10*3/uL — ABNORMAL HIGH (ref 1.7–7.7)
Neutrophils Relative %: 90 %

## 2020-07-10 LAB — URINE DRUG SCREEN, QUALITATIVE (ARMC ONLY)
Amphetamines, Ur Screen: NOT DETECTED
Barbiturates, Ur Screen: NOT DETECTED
Benzodiazepine, Ur Scrn: NOT DETECTED
Cannabinoid 50 Ng, Ur ~~LOC~~: NOT DETECTED
Cocaine Metabolite,Ur ~~LOC~~: NOT DETECTED
MDMA (Ecstasy)Ur Screen: NOT DETECTED
Methadone Scn, Ur: NOT DETECTED
Opiate, Ur Screen: NOT DETECTED
Phencyclidine (PCP) Ur S: NOT DETECTED
Tricyclic, Ur Screen: NOT DETECTED

## 2020-07-10 LAB — CBC
HCT: 46.1 % (ref 39.0–52.0)
Hemoglobin: 16 g/dL (ref 13.0–17.0)
MCH: 30.5 pg (ref 26.0–34.0)
MCHC: 34.7 g/dL (ref 30.0–36.0)
MCV: 87.8 fL (ref 80.0–100.0)
Platelets: 128 10*3/uL — ABNORMAL LOW (ref 150–400)
RBC: 5.25 MIL/uL (ref 4.22–5.81)
RDW: 11.9 % (ref 11.5–15.5)
WBC: 13.6 10*3/uL — ABNORMAL HIGH (ref 4.0–10.5)
nRBC: 0 % (ref 0.0–0.2)

## 2020-07-10 LAB — RESP PANEL BY RT-PCR (FLU A&B, COVID) ARPGX2
Influenza A by PCR: NEGATIVE
Influenza B by PCR: NEGATIVE
SARS Coronavirus 2 by RT PCR: NEGATIVE

## 2020-07-10 LAB — LACTIC ACID, PLASMA: Lactic Acid, Venous: 3.2 mmol/L (ref 0.5–1.9)

## 2020-07-10 LAB — COMPREHENSIVE METABOLIC PANEL
ALT: 11 U/L (ref 0–44)
AST: 14 U/L — ABNORMAL LOW (ref 15–41)
Albumin: 4.7 g/dL (ref 3.5–5.0)
Alkaline Phosphatase: 88 U/L (ref 38–126)
Anion gap: 27 — ABNORMAL HIGH (ref 5–15)
BUN: 53 mg/dL — ABNORMAL HIGH (ref 8–23)
CO2: 25 mmol/L (ref 22–32)
Calcium: 10.7 mg/dL — ABNORMAL HIGH (ref 8.9–10.3)
Chloride: 81 mmol/L — ABNORMAL LOW (ref 98–111)
Creatinine, Ser: 3.35 mg/dL — ABNORMAL HIGH (ref 0.61–1.24)
GFR, Estimated: 19 mL/min — ABNORMAL LOW (ref >60)
Glucose, Bld: 667 mg/dL (ref 70–99)
Potassium: 4.9 mmol/L (ref 3.5–5.1)
Sodium: 133 mmol/L — ABNORMAL LOW (ref 135–145)
Total Bilirubin: 1.7 mg/dL — ABNORMAL HIGH (ref 0.3–1.2)
Total Protein: 7.9 g/dL (ref 6.5–8.1)

## 2020-07-10 LAB — PROTIME-INR
INR: 1 (ref 0.8–1.2)
Prothrombin Time: 13.7 seconds (ref 11.4–15.2)

## 2020-07-10 LAB — ETHANOL: Alcohol, Ethyl (B): <10 mg/dL (ref <10)

## 2020-07-10 LAB — APTT: aPTT: <24 seconds — ABNORMAL LOW (ref 24–36)

## 2020-07-10 MED ORDER — CHLORTHALIDONE 25 MG TABLET
ORAL_TABLET | Freq: Every morning | ORAL | 0 refills | 60.00000 days | Status: CP
Start: 2020-07-10 — End: 2021-07-10
  Filled 2020-08-21: qty 30, 60d supply, fill #0

## 2020-07-10 MED ORDER — IOHEXOL 350 MG/ML SOLN
100.0000 mL | Freq: Once | INTRAVENOUS | Status: AC | PRN
  Administered 2020-07-10: 100 mL via INTRAVENOUS

## 2020-07-10 MED ORDER — ROCURONIUM BROMIDE 50 MG/5ML IV SOLN
70.0000 mg | Freq: Once | INTRAVENOUS | Status: AC
  Administered 2020-07-10: 70 mg via INTRAVENOUS
  Filled 2020-07-10: qty 7

## 2020-07-10 MED ORDER — FENTANYL CITRATE (PF) 100 MCG/2ML IJ SOLN
100.0000 ug | Freq: Once | INTRAMUSCULAR | Status: AC
  Administered 2020-07-10: 100 ug via INTRAVENOUS

## 2020-07-10 MED ORDER — SODIUM CHLORIDE 0.9 % IV SOLN
3.0000 g | Freq: Once | INTRAVENOUS | Status: AC
  Administered 2020-07-11: 3 g via INTRAVENOUS
  Filled 2020-07-10: qty 8

## 2020-07-10 MED ORDER — PANTOPRAZOLE SODIUM 40 MG IV SOLR
40.0000 mg | Freq: Once | INTRAVENOUS | Status: AC
  Administered 2020-07-11: 40 mg via INTRAVENOUS
  Filled 2020-07-10: qty 40

## 2020-07-10 MED ORDER — LABETALOL HCL 5 MG/ML IV SOLN: 20.0000 mg | Freq: Once | INTRAVENOUS | Status: DC

## 2020-07-10 MED ORDER — PROPOFOL 10 MG/ML IV BOLUS
2.0000 mg/kg | Freq: Once | INTRAVENOUS | Status: AC
  Administered 2020-07-10: 150 mg via INTRAVENOUS

## 2020-07-10 MED ORDER — SODIUM CHLORIDE 0.9 % IV BOLUS
1000.0000 mL | Freq: Once | INTRAVENOUS | Status: AC
  Administered 2020-07-10: 1000 mL via INTRAVENOUS

## 2020-07-10 MED ORDER — MIDAZOLAM HCL 5 MG/5ML IJ SOLN
5.0000 mg | Freq: Once | INTRAMUSCULAR | Status: AC
  Administered 2020-07-10: 5 mg via INTRAVENOUS

## 2020-07-10 MED ORDER — PROPOFOL 1000 MG/100ML IV EMUL
5.0000 ug/kg/min | INTRAVENOUS | Status: DC
  Administered 2020-07-10: 40 ug/kg/min via INTRAVENOUS
  Administered 2020-07-11: 25 ug/kg/min via INTRAVENOUS
  Administered 2020-07-11: 40 ug/kg/min via INTRAVENOUS
  Administered 2020-07-11: 70 ug/kg/min via INTRAVENOUS
  Filled 2020-07-10 (×4): qty 100

## 2020-07-10 MED ORDER — FENTANYL CITRATE (PF) 100 MCG/2ML IJ SOLN
INTRAMUSCULAR | Status: AC
  Filled 2020-07-10: qty 2

## 2020-07-10 NOTE — ED Notes (Signed)
Patient's eyes are open. Propofol increased to 23mcg/kg/min.

## 2020-07-10 NOTE — ED Notes (Signed)
Patient is more alert, moves left arm purposefully to pull up sheet and raised arm to his face when non-rebreather was placed. Patient has audible congestion in back of throat and tried to clear it when asked. Patient is not drooling. Patient now will turn head to the left side and focus on this Probation officer. MD aware.

## 2020-07-10 NOTE — H&P (Signed)
NAME:  Hector Mueller, MRN:  [id number], DOB:  24-Apr-1950, LOS: 0 ADMISSION DATE:  07/10/2020, CONSULTATION DATE: 07/10/2020 REFERRING MD: Dr. Joni Fears, CHIEF COMPLAINT: Altered mental status  History of Present Illness:  70 year old male who was brought to the [facility] ED due to altered mental status.  Per ED documentation the patient was reportedly in his usual state of health last night on 07/09/20 until about 11:30 PM.  This morning the patient was noted to have difficulty walking, but was talking and interacting normally with family.  However today at noon he was found to be unable to stand or move purposefully as well as being unable to speak. ED course:  Upon arrival code stroke was initiated due to concerns for CVA with LVO and a stat neurology consult was placed.  Head CT without contrast was negative for apparent infarct or intracranial hemorrhage.  NIHSS was evaluated at 20, and the patient became unable to protect his airway with gurgling respirations requiring supplemental oxygen and the patient was emergently intubated requiring mechanical ventilation.  Per ED provider patient had some jerking movements while they were preparing for intubation but was continuing to move left upper extremity purposefully.  Evidence of gastric contents coming up the endotracheal tube and after OG tube was inserted dark reddish-black liquid with coffee-ground emesis. CTA/CTP performed without evidence of LVO.  Initial vitals: Tachypneic at 30, tachycardic at 114, hypertensive at 178/100 with SPO2 92% on room air. Significant labs: Hyponatremic at 133, hyperglycemia at 667 indicative of HHS, chloride reduced at 81, AKI with BUN/Cr: 53/3.35, anion gap elevated at 27, leukocytosis at 13.6, lactic acidosis at 3.2 > 3.8. Pertinent  Medical History  Cirrhosis status post liver transplant in 2018 Pulmonary arterial hypertension Hepatitis C Type 2 diabetes mellitus Atrial fibrillation BPH Liver cancer Substance  abuse Tracheostomy Hyperlipidemia Significant Hospital Events: Including procedures, antibiotic start and stop dates in addition to other pertinent events   . 07/10/2020-patient evaluated for code stroke, intubated in ED for airway protection and admitted to ICU.  Interim History / Subjective:  Patient intubated and sedated, unable to follow commands at this time.  Objective   Blood pressure (!) 177/95, pulse (!) 103, resp. rate 20, weight 68.9 kg, SpO2 100 %.        Intake/Output Summary (Last 24 hours) at 07/10/2020 2340 Last data filed at 07/10/2020 2320 Gross per 24 hour  Intake 1000 ml  Output --  Net 1000 ml   Filed Weights   07/10/20 2107  Weight: 68.9 kg    Examination: General: Adult male, critically ill, lying in bed intubated & sedated requiring mechanical ventilation, NAD HEENT: MM pink/moist, anicteric, atraumatic, neck supple Neuro: Sedated, unable to follow commands commands, PERRL +3 CV: s1s2 RRR, NSR on monitor, no r/m/g Pulm: Regular, non labored on PRVC at 40% FiO2 and PEEP of 5, breath sounds diminished throughout GI: soft, rounded, non tender, bs x 4 GU: foley in place with clear yellow urine Skin: Limited exam- no rashes/lesions noted Extremities: warm/dry, pulses + 2 R/P, trace edema noted BLE  Labs/imaging that I have personally reviewed  (right click and "Reselect all SmartList Selections" daily)  Na+/ K+: 133/4.9 BUN/Cr.:  53/3.35 Serum CO2/ AG: 25/27  Hgb: 16  WBC/ TMAX: 13.6/afebrile Lactic/ PCT: 3.2 > 3.8  VBG: 7.30/49/48/24.1 CXR 07/10/2020: No active disease Resolved Hospital Problem list     Assessment & Plan:  Acute Hypoxic Respiratory Failure secondary to acute encephalopathy in the setting of a suspected CVA - Ventilator  settings: PRVC  8 mL/kg, 40% FiO2, 5 PEEP, continue ventilator support & lung protective strategies - Wean PEEP & FiO2 as tolerated, maintain SpO2 > 90% - Head of bed elevated 30 degrees, VAP protocol in  place - Plateau pressures less than 30 cm H20  - Intermittent chest x-ray & ABG PRN - Daily WUA with SBT as tolerated  - Ensure adequate pulmonary hygiene  - F/u cultures, trend PCT - Continue Aspiration Pna coverage unasyn - bronchodilators PRN - PAD protocol in place: continue Fentanyl & Propofol  HHS PMHx: T2DM Unclear if patient was taking insulin regularly in the setting of acute encephalopathy pH 7.30, CO2 25, Anion Gap 27, serum glucose: 667, Potassium: 4.9  -HHS protocol initiated - Aggressive IV hydration, when blood glucose falls below 250 add D5 to IV fluids - Insulin drip ordered per Endo tool protocol - Strict I/O's: alert provider if UOP < 0.5 mL/kg/hr - Q 4 BMP, closely monitor potassium, replace electrolytes PRN - monitor blood glucose every 1 hour, per Endo tool protocol - trend serum CO2/ AG/ Lactic, f/u A1C - Diabetes coordinator consult - Resume home insulin regiment once appropriate  Acute encephalopathy due to suspected CVA Initial imaging negative for any intracranial abnormality.  Patient is outside the window for tPA, negative for LVO. -Supportive care -We will need follow-up MRI -Consider neurological consult as needed -Frequent neuro checks, wean sedatives as tolerated  Acute Kidney Injury on CKD Baseline Cr: 1.6, Cr on admission: 3.35 - Strict I/O's: alert provider if UOP < 0.5 mL/kg/hr - gentle IVF hydration  - Daily BMP, replace electrolytes PRN - Avoid nephrotoxic agents as able, ensure adequate renal perfusion - Consult nephrology if iHD or CRRT indicated  - consider renal US  Coffee-ground emesis Thrombocytopenia  Cirrhosis status post liver transplant in 2018 Minimal coffee-ground emesis from OG tube, patient hemodynamically stable even on sedation and CBC grossly normal.  Will monitor for now Patient takes cyclosporine outpatient. -Continue cyclosporine home regimen -Trend hepatic function -Daily CBC -Monitor for signs and symptoms of  bleeding -Transfuse if hemoglobin less than 7 - Consider transfusion of platelets if < 10  Pulmonary arterial hypertension Who class I -Continue home sildenafil -Continuous cardiac monitoring -Consult cardiology if unable to tolerate home regimen  Best practice (right click and "Reselect all SmartList Selections" daily)  Diet:  NPO Pain/Anxiety/Delirium protocol (if indicated): Yes (RASS goal -1) VAP protocol (if indicated): Yes DVT prophylaxis: Subcutaneous Heparin GI prophylaxis: PPI Glucose control:  Insulin gtt Central venous access:  N/A Arterial line:  N/A Foley:  Yes, and it is still needed Mobility:  bed rest  PT consulted: N/A Last date of multidisciplinary goals of care discussion 07/10/20 Code Status:  full code Disposition: ICU  Labs   CBC: Recent Labs  Lab 07/10/20 2109  WBC 13.6*  NEUTROABS 12.3*  HGB 16.0  HCT 46.1  MCV 87.8  PLT 128*    Basic Metabolic Panel: Recent Labs  Lab 07/10/20 2109  NA 133*  K 4.9  CL 81*  CO2 25  GLUCOSE 667*  BUN 53*  CREATININE 3.35*  CALCIUM 10.7*   GFR: CrCl cannot be calculated (Unknown ideal weight.). Recent Labs  Lab 07/10/20 2109 07/10/20 2155  WBC 13.6*  --   LATICACIDVEN  --  3.2*    Liver Function Tests: Recent Labs  Lab 07/10/20 2109  AST 14*  ALT 11  ALKPHOS 88  BILITOT 1.7*  PROT 7.9  ALBUMIN 4.7   No results for input(s):  LIPASE, AMYLASE in the last 168 hours. No results for input(s): AMMONIA in the last 168 hours.  ABG No results found for: PHART, PCO2ART, PO2ART, HCO3, TCO2, ACIDBASEDEF, O2SAT   Coagulation Profile: Recent Labs  Lab 07/10/20 2109  INR 1.0    Cardiac Enzymes: No results for input(s): CKTOTAL, CKMB, CKMBINDEX, TROPONINI in the last 168 hours.  HbA1C: No results found for: HGBA1C  CBG: No results for input(s): GLUCAP in the last 168 hours.  Review of Systems:   UTA patient intubated and sedated  Past Medical History:  He,  has a past medical  history of Atrial fibrillation ([place]), BPH (benign prostatic hyperplasia), Cancer ([place]), Cirrhosis ([place]), Diabetes mellitus without complication ([place]), GERD (gastroesophageal reflux disease), and Hepatitis C.   Surgical History:   Past Surgical History:  Procedure Laterality Date  . ANKLE FRACTURE SURGERY Bilateral   . BACK SURGERY    . CLAVICLE SURGERY    . INTRAMEDULLARY (IM) NAIL INTERTROCHANTERIC Right 11/28/2017   Procedure: INTRAMEDULLARY (IM) NAIL INTERTROCHANTRIC;  Surgeon: Earnestine Leys, MD;  Location: [facility] ORS;  Service: Orthopedics;  Laterality: Right;  . LIVER SURGERY       Social History:   reports that he has never smoked. He has never used smokeless tobacco. He reports that he does not drink alcohol.   Family History:  His family history is not on file.   Allergies No Known Allergies   Home Medications  Prior to Admission medications   Medication Sig Start Date End Date Taking? Authorizing Provider  alendronate (FOSAMAX) 70 MG tablet  10/27/17   [provider]  aspirin 81 MG chewable tablet Chew 1 tablet (81 mg total) by mouth daily. 12/02/17   Vaughan Basta, MD  bisacodyl (DULCOLAX) 5 MG EC tablet Take 1 tablet (5 mg total) by mouth daily as needed for moderate constipation. 12/01/17   Vaughan Basta, MD  calcium carbonate (TUMS - DOSED IN MG ELEMENTAL CALCIUM) 500 MG chewable tablet Chew 2 tablets (400 mg of elemental calcium total) by mouth daily as needed for indigestion or heartburn. 12/01/17   Vaughan Basta, MD  Cholecalciferol (VITAMIN D3) 2000 units capsule Take 2,000 Units by mouth daily.    [provider]  cycloSPORINE modified (NEORAL) 100 MG capsule Take 100 mg by mouth 2 (two) times daily. Take along with 3 x 25 mg cap twice a day    [provider]  cycloSPORINE modified (NEORAL) 25 MG capsule Take 75 mg by mouth 2 (two) times daily. Take with 100 mg cap twice a day    [provider]  docusate  sodium (COLACE) 100 MG capsule Take 1 capsule (100 mg total) by mouth 2 (two) times daily. 12/01/17   Vaughan Basta, MD  enoxaparin (LOVENOX) 40 MG/0.4ML injection Inject 0.4 mLs (40 mg total) into the skin daily for 12 days. 12/02/17 12/14/17  Vaughan Basta, MD  ferrous sulfate 325 (65 FE) MG tablet Take 1 tablet (325 mg total) by mouth 2 (two) times daily with a meal. 12/01/17   Vaughan Basta, MD  HYDROcodone-acetaminophen (NORCO/VICODIN) 5-325 MG tablet Take 1 tablet by mouth every 4 (four) hours as needed for moderate pain or severe pain (pain score 4-6). 12/01/17   Vaughan Basta, MD  insulin aspart (NOVOLOG) 100 UNIT/ML FlexPen Inject 6 Units into the skin 3 (three) times daily with meals.  12/09/16 02/06/18  [provider]  Insulin Glargine (LANTUS SOLOSTAR) 100 UNIT/ML Solostar Pen INJECT 12 UNITS SUBCUTANEOUSLY (UNDER SKIN) NIGHTLY 12/09/16 12/09/17  [provider]  Melatonin 3 MG TABS Take by mouth. 02/06/17   [provider]  mirtazapine (REMERON) 15 MG tablet Take 15 mg by mouth at bedtime.  10/18/17   [provider]  Multiple Vitamin (MULTIVITAMIN) tablet Take 1 tablet by mouth daily.    [provider]  ondansetron (ZOFRAN) 4 MG tablet Take 1 tablet (4 mg total) by mouth every 6 (six) hours as needed for nausea. 12/01/17   Vaughan Basta, MD  senna (SENOKOT) 8.6 MG TABS tablet Take 1 tablet (8.6 mg total) by mouth 2 (two) times daily. 12/01/17   Vaughan Basta, MD  sildenafil (REVATIO) 20 MG tablet Take 40 mg by mouth 3 (three) times daily.  10/17/17   [provider]  Specialty Vitamins Products (MAGNESIUM, AMINO ACID CHELATE,) 133 MG tablet Take by mouth. 03/02/17   [provider]  spironolactone (ALDACTONE) 50 MG tablet Take 50 mg by mouth daily. 01/07/14   [provider]     Critical care time: 55 minutes       Venetia Night, AGACNP-BC Acute Care Nurse  Practitioner [facility]   [phone] / [phone] Please see Amion for pager details.

## 2020-07-10 NOTE — ED Notes (Signed)
Dark coffee grounds noted in OG tube.

## 2020-07-10 NOTE — ED Notes (Signed)
Dr. Joni Fears, this writer, April B RN, Karena Addison RT, Inez Catalina RN

## 2020-07-10 NOTE — ED Notes (Signed)
X-ray at bedside

## 2020-07-10 NOTE — Consult Note (Signed)
[facility] Tele[facility]urology Consult Services   Date of Service:   07/10/2020 21:18:30  Diagnosis:     .  R73.9 - Hyperglycaemia, unspecified     .  G93.49 - Encephalopathy Multifactorial  Impression:     . Patient is 89yoM with hx of DM, HTN, HLD, s/p liver transplant a-fib off AC who was found unresponsive in setting of hyperglycemia. On my evaluation, he is awake with right gaze deviation that can be overcome, he is non-verbal and not following commands. He has generalized weakness more in the lower extremities. NIHSS: 20. CTH negative for acute abnormalities              Possible metabolic but given focal deficits on presentation, recommend to r/o CVA and focal seizures         Metrics: Last Known Well: 07/10/2020 12:00:00 [facility] Notification Time: 07/10/2020 21:18:30 Arrival Time: 07/10/2020 20:56:00 Stamp Time: 07/10/2020 21:18:30 Initial Response Time: 07/10/2020 21:21:54 Symptoms: Found unresponsive . NIHSS Start Assessment Time: 07/10/2020 21:23:37 Patient is not a candidate for Thrombolytic. Thrombolytic Medical Decision: 07/10/2020 21:30:00 Patient was not deemed candidate for Thrombolytic because of following reasons: Last Well Known Above 4.5 Hours.  CT head showed no acute hemorrhage or acute core infarct.  ED Physician notified of diagnostic impression and management plan on 07/10/2020 22:05:55  Advanced Imaging: Advanced Imaging Not Recommended because:  Patient being Transferred to another Facility for Advanced Imaging   Our recommendations are outlined below.  Recommendations:      .  Stroke/Telemetry Floor     .  Neuro Checks     .  Bedside Swallow Eval     .  DVT Prophylaxis     .  IV Fluids, Normal Saline     .  Head of Bed 30 Degrees     .  Euglycemia and Avoid Hyperthermia (PRN Acetaminophen)     .  Initiate Aspirin 81 MG Daily     .  Antihypertensives PRN if Blood pressure is greater than 220/120 or there is a  concern for End organ damage/contraindications for permissive HTN. If blood pressure is greater than 220/120 give labetalol PO or IV or Vasotec IV with a goal of 15% reduction in BP during the first 24 hours.     Marland Kitchen  MRI brain stroke protocol     .  EEG awake and drowsy     .  Correction of hyperglycemia per primary     .  Infectious and metabolic work up per primary  Routine Consultation with [facility] for Follow up Care  Sign Out:     .  Discussed with Emergency Department Provider    ------------------------------------------------------------------------------  History of Present Illness: Patient is a 70 year old Male.  Patient was brought by EMS for symptoms of Found unresponsive .  Patient is 21yoM with hx of DM, HTN, HLD, s/p liver transplant a-fib off AC who was found unresponsive. Patient last seen normal around noon. He was found on the floor unresponsive and brought to ED vis EMS On initial evaluation his glucose was elevated >400s and he had right sided weakness and he was non-verbal and not following commands. CTH was completed and negative for acute abnormalities On my evaluation, he is awake with right gaze deviation that can be overcome, he is non-verbal and not following commands. He has generalized weakness more in the lower extremities. Per chart, no hx of strokes.   Past Medical History:     . Hypertension     .  Diabetes Mellitus     . Hyperlipidemia     . Atrial Fibrillation  Anticoagulant use:  No  Antiplatelet use: No  Allergies:  Reviewed    Examination: BP(190/107), Pulse(115), Blood Glucose(600) 1A: Level of Consciousness - Alert; keenly responsive + 0 1B: Ask Month and Age - Could Not Answer Either Question Correctly + 2 1C: Blink Eyes & Squeeze Hands - Performs 0 Tasks + 2 2: Test Horizontal Extraocular Movements - Partial Gaze Palsy: Can Be Overcome + 1 3: Test Visual Fields - No Visual Loss + 0 4: Test Facial Palsy (Use Grimace if  Obtunded) - Normal symmetry + 0 5A: Test Left Arm Motor Drift - Some Effort Against Gravity + 2 5B: Test Right Arm Motor Drift - Some Effort Against Gravity + 2 6A: Test Left Leg Motor Drift - No Effort Against Gravity + 3 6B: Test Right Leg Motor Drift - No Effort Against Gravity + 3 7: Test Limb Ataxia (FNF/Heel-Shin) - No Ataxia + 0 8: Test Sensation - Normal; No sensory loss + 0 9: Test Language/Aphasia - Mute/Global Aphasia: No Usable Speech/Auditory Comprehension + 3 10: Test Dysarthria - Mute/Anarthric + 2 11: Test Extinction/Inattention - No abnormality + 0  NIHSS Score: 20   Pre-Morbid Modified Rankin Scale: 0 Points = No symptoms at all   Patient/Family was informed the Neurology Consult would occur via TeleHealth consult by way of interactive audio and video telecommunications and consented to receiving care in this manner.   Patient is being evaluated for possible acute neurologic impairment and high probability of imminent or life-threatening deterioration. I spent total of 35 minutes providing care to this patient, including time for face to face visit via telemedicine, review of medical records, imaging studies and discussion of findings with providers, the patient and/or family.   Dr Beryl Meager   [facility] [phone]  Case [id number]

## 2020-07-10 NOTE — ED Provider Notes (Signed)
[facility] Emergency Department Provider Note  ____________________________________________  Time seen: Approximately 10:46 PM  I have reviewed the triage vital signs and the nursing notes.   HISTORY  Chief Complaint Code Stroke    Level 5 Caveat: Portions of the History and Physical including HPI and review of systems are unable to be completely obtained due to patient being a poor historian   HPI Hector Mueller is a 70 y.o. male with a history of atrial fibrillation, cirrhosis, diabetes, GERD who is brought to the ED due to altered mental status.  Patient was reportedly in his usual state of health last night at about 11:30 PM.  This morning he was noted to have some difficulty walking, but was talking and interacting normally with family.  However, today at noon he was found to be unable to stand or move purposefully, unable to speak.  Patient is unable to answer any questions or provide any history.     Past Medical History:  Diagnosis Date  . Atrial fibrillation ([place])   . BPH (benign prostatic hyperplasia)   . Cancer ([place])    liver  . Cirrhosis ([place])   . Diabetes mellitus without complication ([place])   . GERD (gastroesophageal reflux disease)   . Hepatitis C      Patient Active Problem List   Diagnosis Date Noted  . Hip fracture ([place]) 11/27/2017     Past Surgical History:  Procedure Laterality Date  . ANKLE FRACTURE SURGERY Bilateral   . BACK SURGERY    . CLAVICLE SURGERY    . INTRAMEDULLARY (IM) NAIL INTERTROCHANTERIC Right 11/28/2017   Procedure: INTRAMEDULLARY (IM) NAIL INTERTROCHANTRIC;  Surgeon: Earnestine Leys, MD;  Location: [facility] ORS;  Service: Orthopedics;  Laterality: Right;  . LIVER SURGERY       Prior to Admission medications   Medication Sig Start Date End Date Taking? Authorizing Provider  alendronate (FOSAMAX) 70 MG tablet  10/27/17   [provider]  aspirin 81 MG chewable tablet Chew 1 tablet (81 mg total) by  mouth daily. 12/02/17   Vaughan Basta, MD  bisacodyl (DULCOLAX) 5 MG EC tablet Take 1 tablet (5 mg total) by mouth daily as needed for moderate constipation. 12/01/17   Vaughan Basta, MD  calcium carbonate (TUMS - DOSED IN MG ELEMENTAL CALCIUM) 500 MG chewable tablet Chew 2 tablets (400 mg of elemental calcium total) by mouth daily as needed for indigestion or heartburn. 12/01/17   Vaughan Basta, MD  Cholecalciferol (VITAMIN D3) 2000 units capsule Take 2,000 Units by mouth daily.    [provider]  cycloSPORINE modified (NEORAL) 100 MG capsule Take 100 mg by mouth 2 (two) times daily. Take along with 3 x 25 mg cap twice a day    [provider]  cycloSPORINE modified (NEORAL) 25 MG capsule Take 75 mg by mouth 2 (two) times daily. Take with 100 mg cap twice a day    [provider]  docusate sodium (COLACE) 100 MG capsule Take 1 capsule (100 mg total) by mouth 2 (two) times daily. 12/01/17   Vaughan Basta, MD  enoxaparin (LOVENOX) 40 MG/0.4ML injection Inject 0.4 mLs (40 mg total) into the skin daily for 12 days. 12/02/17 12/14/17  Vaughan Basta, MD  ferrous sulfate 325 (65 FE) MG tablet Take 1 tablet (325 mg total) by mouth 2 (two) times daily with a meal. 12/01/17   Vaughan Basta, MD  HYDROcodone-acetaminophen (NORCO/VICODIN) 5-325 MG tablet Take 1 tablet by mouth every 4 (four) hours as  needed for moderate pain or severe pain (pain score 4-6). 12/01/17   Vaughan Basta, MD  insulin aspart (NOVOLOG) 100 UNIT/ML FlexPen Inject 6 Units into the skin 3 (three) times daily with meals.  12/09/16 02/06/18  [provider]  Insulin Glargine (LANTUS SOLOSTAR) 100 UNIT/ML Solostar Pen INJECT 12 UNITS SUBCUTANEOUSLY (UNDER SKIN) NIGHTLY 12/09/16 12/09/17  [provider]  Melatonin 3 MG TABS Take by mouth. 02/06/17   [provider]  mirtazapine (REMERON) 15 MG tablet Take 15 mg by mouth at bedtime.  10/18/17    [provider]  Multiple Vitamin (MULTIVITAMIN) tablet Take 1 tablet by mouth daily.    [provider]  ondansetron (ZOFRAN) 4 MG tablet Take 1 tablet (4 mg total) by mouth every 6 (six) hours as needed for nausea. 12/01/17   Vaughan Basta, MD  senna (SENOKOT) 8.6 MG TABS tablet Take 1 tablet (8.6 mg total) by mouth 2 (two) times daily. 12/01/17   Vaughan Basta, MD  sildenafil (REVATIO) 20 MG tablet Take 40 mg by mouth 3 (three) times daily.  10/17/17   [provider]  Specialty Vitamins Products (MAGNESIUM, AMINO ACID CHELATE,) 133 MG tablet Take by mouth. 03/02/17   [provider]  spironolactone (ALDACTONE) 50 MG tablet Take 50 mg by mouth daily. 01/07/14   [provider]     Allergies Patient has no known allergies.   No family history on file.  Social History Social History   Tobacco Use  . Smoking status: Never Smoker  . Smokeless tobacco: Never Used  Substance Use Topics  . Alcohol use: No    Review of Systems Level 5 Caveat: Portions of the History and Physical including HPI and review of systems are unable to be completely obtained due to patient being a poor historian   Constitutional:   No known fever.  ENT:   No rhinorrhea. Cardiovascular:   No chest pain or syncope. Respiratory:   No dyspnea or cough. Gastrointestinal:   Negative for abdominal pain, vomiting and diarrhea.  Musculoskeletal:   Negative for focal pain or swelling ____________________________________________   PHYSICAL EXAM:  VITAL SIGNS: ED Triage Vitals  Enc Vitals Group     BP 07/10/20 2100 (!) 190/107     Pulse Rate 07/10/20 2108 (!) 115     Resp 07/10/20 2100 (!) 22     Temp --      Temp src --      SpO2 07/10/20 2130 (!) 89 %     Weight 07/10/20 2107 152 lb (68.9 kg)     Height --      Head Circumference --      Peak Flow --      Pain Score --      Pain Loc --      Pain Edu? --      Excl. in [place]? --     Vital  signs reviewed, nursing assessments reviewed.   Constitutional: Awake, not oriented.  Ill-appearing. Eyes:   Conjunctivae are normal.  Gaze fixed to the right.  Able to bring gaze to midline but unable to look to the left.  No nystagmus.[facility]      Head:   Normocephalic and atraumatic.      Nose:   No congestion/rhinnorhea.       Mouth/Throat:   Dry mucous membranes, no pharyngeal erythema. No peritonsillar mass.       Neck:   No meningismus. Full ROM. Hematological/Lymphatic/Immunilogical:   No cervical lymphadenopathy.  Cardiovascular:   Tachycardia heart rate 115. Symmetric bilateral radial and DP pulses.  No murmurs. Cap refill less than 2 seconds. Respiratory:   Gurgling respirations, tachypnea with a respiratory rate of 24.  Breath sounds clear bilaterally.  No wheezing. Gastrointestinal:   Soft and nontender. Non distended. There is no CVA tenderness.  No rebound, rigidity, or guarding. Genitourinary:   deferred Musculoskeletal:   No deformities, no focal swelling or wounds.  Range of motion untestable Neurologic:   Nonverbal Attempts to follow commands.  Able to move left upper extremity purposefully.  Right upper extremity is able to resist gravity briefly. Bilateral lower extremities no resistance to gravity. Normal patellar reflexes bilaterally.  Plantar reflexes are quiet. No response to painful stimulus x4 extremities NIH stroke scale 20  Skin:    Skin is warm, dry and intact. No rash noted.  No petechiae, purpura, or bullae.  ____________________________________________    LABS (pertinent positives/negatives) (all labs ordered are listed, but only abnormal results are displayed) Labs Reviewed  APTT - Abnormal; Notable for the following components:      Result Value   aPTT <24 (*)    All other components within normal limits  CBC - Abnormal; Notable for the following components:   WBC 13.6 (*)    Platelets 128 (*)    All other components within normal limits   DIFFERENTIAL - Abnormal; Notable for the following components:   Neutro Abs 12.3 (*)    Lymphs Abs 0.5 (*)    Abs Immature Granulocytes 0.08 (*)    All other components within normal limits  COMPREHENSIVE METABOLIC PANEL - Abnormal; Notable for the following components:   Sodium 133 (*)    Chloride 81 (*)    Glucose, Bld 667 (*)    BUN 53 (*)    Creatinine, Ser 3.35 (*)    Calcium 10.7 (*)    AST 14 (*)    Total Bilirubin 1.7 (*)    GFR, Estimated 19 (*)    Anion gap 27 (*)    All other components within normal limits  URINALYSIS, COMPLETE (UACMP) WITH MICROSCOPIC - Abnormal; Notable for the following components:   Color, Urine STRAW (*)    APPearance CLEAR (*)    Glucose, UA >=500 (*)    Hgb urine dipstick MODERATE (*)    Ketones, ur 80 (*)    Protein, ur 30 (*)    All other components within normal limits  LACTIC ACID, PLASMA - Abnormal; Notable for the following components:   Lactic Acid, Venous 3.2 (*)    All other components within normal limits  RESP PANEL BY RT-PCR (FLU A&B, COVID) ARPGX2  CULTURE, BLOOD (SINGLE)  ETHANOL  PROTIME-INR  URINE DRUG SCREEN, QUALITATIVE ([facility] ONLY)  LACTIC ACID, PLASMA  BLOOD GAS, VENOUS  BETA-HYDROXYBUTYRIC ACID  AMMONIA   ____________________________________________   EKG  Interpreted by me Sinus tachycardia rate 119.  Normal axis and intervals.  Normal QRS ST segments and T waves.  ____________________________________________    [id number]  DG Chest Portable 1 View  Result Date: 07/10/2020 CLINICAL DATA:  Status post intubation. Endotracheal tube and orogastric tube. EXAM: PORTABLE CHEST 1 VIEW COMPARISON:  Chest x-ray 11/27/2017 FINDINGS: Endotracheal tube with tip terminating 3.5cm above the carina. Enteric tube coursing below the hemidiaphragm with tip and side port overlying the expected region of the gastric lumen. The heart size and mediastinal contours are unchanged. Aortic arch calcification. Or list device overlies  the mediastinum. No focal consolidation. No pulmonary  edema. No pleural effusion. No pneumothorax. No acute osseous abnormality. Multilevel degenerative changes of the spine. Partially visualized thoracolumbar surgical hardware. Degenerative changes of the shoulders. IMPRESSION: 1. No active disease. 2. Endotracheal tube and enteric tube in good position. Electronically Signed   By: Iven Finn M.D.   On: 07/10/2020 23:36   CT HEAD CODE STROKE WO CONTRAST  Result Date: 07/10/2020 CLINICAL DATA:  Code stroke. Initial evaluation for acute unresponsiveness. EXAM: CT HEAD WITHOUT CONTRAST TECHNIQUE: Contiguous axial images were obtained from the base of the skull through the vertex without intravenous contrast. COMPARISON:  Prior CT from 11/27/2017. 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 No acute intracranial hemorrhage. No acute large vessel territory infarct. No mass lesion, midline shift or mass effect. No hydrocephalus or extra-axial fluid collection. Vascular: No hyperdense vessel. Scattered vascular calcifications noted within the carotid siphons. Skull: Scalp soft tissues and calvarium within normal limits. Sinuses/Orbits: Globes orbital soft tissues demonstrate no acute finding. Mild mucosal thickening within the ethmoidal air cells. Paranasal sinuses are otherwise clear. No mastoid effusion. Other: None. ASPECTS [facility] Stroke Program Early CT Score) - Ganglionic level infarction (caudate, lentiform nuclei, internal capsule, insula, M1-M3 cortex): 7 - Supraganglionic infarction (M4-M6 cortex): 3 Total score (0-10 with 10 being normal): 10 IMPRESSION: 1. No acute intracranial abnormality. 2. ASPECTS is 10. 3. Age-related cerebral atrophy with chronic microvascular ischemic disease. Critical Value/emergent results were called by telephone at the time of  interpretation on 07/10/2020 at 9:30 pm to provider Beckie Viscardi , who verbally acknowledged these results. Electronically Signed   By: Jeannine Boga M.D.   On: 07/10/2020 21:33    ____________________________________________   PROCEDURES .Critical Care Performed by: Carrie Mew, MD Authorized by: Carrie Mew, MD   Critical care provider statement:    Critical care time (minutes):  60   Critical care time was exclusive of:  Separately billable procedures and treating other patients   Critical care was necessary to treat or prevent imminent or life-threatening deterioration of the following conditions:  Respiratory failure, CNS failure or compromise and shock   Critical care was time spent personally by me on the following activities:  Development of treatment plan with patient or surrogate, discussions with consultants, evaluation of patient's response to treatment, examination of patient, obtaining history from patient or surrogate, ordering and performing treatments and interventions, ordering and review of laboratory studies, ordering and review of radiographic studies, pulse oximetry, re-evaluation of patient's condition and review of old charts Comments:        Procedure Name: Intubation Date/Time: 07/10/2020 11:43 PM Performed by: Carrie Mew, MD Pre-anesthesia Checklist: Patient identified, Patient being monitored, Emergency Drugs available, Timeout performed and Suction available Oxygen Delivery Method: Non-rebreather mask Preoxygenation: Pre-oxygenation with 100% oxygen Induction Type: Rapid sequence Ventilation: Mask ventilation without difficulty Laryngoscope Size: Glidescope and 3 Grade View: Grade I Tube size: 7.5 mm Number of attempts: 1 Placement Confirmation: ETT inserted through vocal cords under direct vision,  CO2 detector and Breath sounds checked- equal and bilateral Secured at: 24 cm Tube secured with: ETT holder Dental Injury: Teeth  and Oropharynx as per pre-operative assessment  Comments:       OG placement  Date/Time: 07/10/2020 11:43 PM Performed by: Carrie Mew, MD Authorized by: Carrie Mew, MD  Consent: The procedure was performed in an emergent situation. Patient identity confirmed: arm band Local anesthesia used: no  Anesthesia: Local anesthesia  used: no Patient tolerance: patient tolerated the procedure well with no immediate complications Comments: Advanced to 65cm. Confirmed with cxr.      ____________________________________________  DIFFERENTIAL DIAGNOSIS   Intracranial hemorrhage, large vessel occlusion stroke, DKA, electrolyte normality, aspiration pneumonia  CLINICAL IMPRESSION / ASSESSMENT AND PLAN / ED COURSE  Medications ordered in the ED: Medications  propofol (DIPRIVAN) 1000 MG/100ML infusion (70 mcg/kg/min  68.9 kg Intravenous Rate/Dose Change 07/10/20 2333)  pantoprazole (PROTONIX) injection 40 mg (has no administration in time range)  Ampicillin-Sulbactam (UNASYN) 3 g in sodium chloride 0.9 % 100 mL IVPB (has no administration in time range)  fentaNYL (SUBLIMAZE) 100 MCG/2ML injection (has no administration in time range)  sodium chloride 0.9 % bolus 1,000 mL (0 mLs Intravenous Stopped 07/10/20 2320)  midazolam (VERSED) 5 MG/5ML injection 5 mg (5 mg Intravenous Given 07/10/20 2217)  rocuronium (ZEMURON) injection 70 mg (70 mg Intravenous Given 07/10/20 2220)  propofol (DIPRIVAN) 10 mg/mL bolus/IV push 137.8 mg (150 mg Intravenous Given 07/10/20 2220)  iohexol (OMNIPAQUE) 350 MG/ML injection 100 mL (100 mLs Intravenous Contrast Given 07/10/20 2259)  fentaNYL (SUBLIMAZE) injection 100 mcg (100 mcg Intravenous Given 07/10/20 2341)    Pertinent labs & imaging results that were available during my care of the patient were reviewed by me and considered in my medical decision making (see chart for details).   Damoni Causby was evaluated in Emergency Department on 07/10/2020  for the symptoms described in the history of present illness. He was evaluated in the context of the global COVID-19 pandemic, which necessitated consideration that the patient might be at risk for infection with the SARS-CoV-2 virus that causes COVID-19. Institutional protocols and algorithms that pertain to the evaluation of patients at risk for COVID-19 are in a state of rapid change based on information released by regulatory bodies including the CDC and federal and state organizations. These policies and algorithms were followed during the patient's care in the ED.     Clinical Course as of 07/10/20 2344  Fri Jul 10, 2020  2106 Patient presents with altered mental status concerning for CVA with LVO.  Code stroke initiated. [PS]  2202 CT head images viewed by me and discussed with radiologist as well.  No apparent infarct or intracranial hemorrhage.  We will need to proceed with CTA/CTP, stroke scale is about 20.  Patient has not able to protect his airway, gurgling respirations, requiring supplemental O2.  Will need to intubate.  While preparing for intubation, patient made jerking movements but still moving LUE purposefully. Doubt status epilepticus. [PS]  2240 Patient intubated, has evidence of aspiration with gastric contents coming up the endotracheal tube.  OG tube inserted, suctioning produces dark reddish-black liquid with coffee-ground. [PS]  2245 Labs show AKI with creatinine of 3.3, GFR of 19.  Patient is critically ill, and only way to assess for LVO stroke is through CT angiogram.  I have asked radiology to proceed with CT angio despite severe renal insufficiency.  Labs also show markedly elevated anion gap and hyperglycemia.  Will check VBG and ketones. [PS]  2340 Discussed with intensivist for admission to [facility] ICU, if CTA negative for LVO. [PS]    Clinical Course User Index [PS] Carrie Mew, MD     ____________________________________________   FINAL CLINICAL  IMPRESSION(S) / ED DIAGNOSES    Final diagnoses:  Altered mental status, unspecified altered mental status type  Diabetic ketoacidosis with coma associated with type 2 diabetes mellitus [facility])     ED Discharge Orders  None      Portions of this note were generated with dragon dictation software. Dictation errors may occur despite best attempts at proofreading.   Carrie Mew, MD 07/10/20 [phone]

## 2020-07-10 NOTE — ED Notes (Signed)
Patient is back from CT scan.

## 2020-07-10 NOTE — ED Notes (Signed)
Code stroke camera at bedside.

## 2020-07-10 NOTE — ED Notes (Signed)
Patient placed on nonrebreather by Dr. Joni Fears. Patient had desat to 84% on room air and was placed on 5L O2 via [place] which brought him up to 94%.

## 2020-07-10 NOTE — ED Notes (Signed)
Patient taken to CT scan with [facility]

## 2020-07-10 NOTE — ED Notes (Signed)
Patient remains with eyes open, moving hand. Propofol increased.

## 2020-07-10 NOTE — ED Notes (Signed)
Patient exhibiting seizure-like activity in face, rhythmic moving of mouth and facial tic. Dr. Joni Fears at bedside.

## 2020-07-10 NOTE — ED Triage Notes (Addendum)
Pt arrives responsive to painful stimuli and attempts to follow commands with left arm md at bedside, code stroke called. Per ems pt was last known normal at noon. fsbs per ems greater than 400, htn on arrival.

## 2020-07-10 NOTE — ED Notes (Signed)
RT at bedside.

## 2020-07-10 NOTE — ED Notes (Signed)
Dr. Joni Fears aware of Lactic acid of 3.2 and Blood glucose of 667.

## 2020-07-10 NOTE — ED Notes (Signed)
Patient has been intubated. Please see RT's notes.

## 2020-07-10 NOTE — Unmapped (Signed)
-----   Message from Vicente Masson sent at 07/10/2020  9:47 AM EDT -----  Regarding: RE: Needs f/u appt  Patient has confirmed he will be here 07/14/20 12pm with dr. Ala Dach  ----- Message -----  From: Vicente Masson  Sent: 07/10/2020   8:41 AM EDT  To: Vicente Masson  Subject: RE: Needs f/u appt                               Called patient left voicemail and sent my chart message to confirm if 5/3 @ 12pm will work to see Dr. Ala Dach.  ----- Message -----  From: Vicente Masson  Sent: 07/10/2020   7:23 AM EDT  To: Vicente Masson  Subject: RE: Needs f/u appt                               Call patient after 9am to see if can make that 5/3 date  ----- Message -----  From: Lars Pinks, RN  Sent: 07/09/2020   4:24 PM EDT  To: Lucienne Minks Pulmonology Acc Front Desk Staff  Subject: Needs f/u appt                                   Can someone call him and schedule an appt for follow-up with Ala Dach?  Return PH. He's very overdue and requesting med refills.     Vernona Rieger

## 2020-07-10 NOTE — Unmapped (Signed)
Per the last MD note pt to continue taking Chlorthalidone 25mg  take 0.5tab daily. Refilled #30 with no refills. Pt has a follow up 08/05/20

## 2020-07-10 NOTE — Unmapped (Signed)
He's all set for 5.25 @ 2:00pm  Thanks

## 2020-07-11 DIAGNOSIS — I429 Cardiomyopathy, unspecified: Secondary | ICD-10-CM | POA: Diagnosis not present

## 2020-07-11 DIAGNOSIS — I5020 Unspecified systolic (congestive) heart failure: Secondary | ICD-10-CM | POA: Diagnosis not present

## 2020-07-11 DIAGNOSIS — N400 Enlarged prostate without lower urinary tract symptoms: Secondary | ICD-10-CM | POA: Diagnosis present

## 2020-07-11 DIAGNOSIS — I480 Paroxysmal atrial fibrillation: Secondary | ICD-10-CM | POA: Diagnosis present

## 2020-07-11 DIAGNOSIS — E785 Hyperlipidemia, unspecified: Secondary | ICD-10-CM | POA: Diagnosis present

## 2020-07-11 DIAGNOSIS — I130 Hypertensive heart and chronic kidney disease with heart failure and stage 1 through stage 4 chronic kidney disease, or unspecified chronic kidney disease: Secondary | ICD-10-CM | POA: Diagnosis present

## 2020-07-11 DIAGNOSIS — J9600 Acute respiratory failure, unspecified whether with hypoxia or hypercapnia: Secondary | ICD-10-CM | POA: Diagnosis present

## 2020-07-11 DIAGNOSIS — E1122 Type 2 diabetes mellitus with diabetic chronic kidney disease: Secondary | ICD-10-CM | POA: Diagnosis present

## 2020-07-11 DIAGNOSIS — I4891 Unspecified atrial fibrillation: Secondary | ICD-10-CM | POA: Diagnosis not present

## 2020-07-11 DIAGNOSIS — Z7982 Long term (current) use of aspirin: Secondary | ICD-10-CM | POA: Diagnosis not present

## 2020-07-11 DIAGNOSIS — Z944 Liver transplant status: Secondary | ICD-10-CM | POA: Diagnosis not present

## 2020-07-11 DIAGNOSIS — I5021 Acute systolic (congestive) heart failure: Secondary | ICD-10-CM | POA: Diagnosis present

## 2020-07-11 DIAGNOSIS — J9601 Acute respiratory failure with hypoxia: Secondary | ICD-10-CM | POA: Diagnosis present

## 2020-07-11 DIAGNOSIS — N179 Acute kidney failure, unspecified: Secondary | ICD-10-CM | POA: Diagnosis present

## 2020-07-11 DIAGNOSIS — D696 Thrombocytopenia, unspecified: Secondary | ICD-10-CM | POA: Diagnosis present

## 2020-07-11 DIAGNOSIS — Z20822 Contact with and (suspected) exposure to covid-19: Secondary | ICD-10-CM | POA: Diagnosis present

## 2020-07-11 DIAGNOSIS — G9341 Metabolic encephalopathy: Secondary | ICD-10-CM | POA: Diagnosis present

## 2020-07-11 DIAGNOSIS — R4182 Altered mental status, unspecified: Secondary | ICD-10-CM | POA: Diagnosis not present

## 2020-07-11 DIAGNOSIS — E876 Hypokalemia: Secondary | ICD-10-CM | POA: Diagnosis present

## 2020-07-11 DIAGNOSIS — I2721 Secondary pulmonary arterial hypertension: Secondary | ICD-10-CM | POA: Diagnosis present

## 2020-07-11 DIAGNOSIS — Z79899 Other long term (current) drug therapy: Secondary | ICD-10-CM | POA: Diagnosis not present

## 2020-07-11 DIAGNOSIS — N184 Chronic kidney disease, stage 4 (severe): Secondary | ICD-10-CM | POA: Diagnosis present

## 2020-07-11 DIAGNOSIS — E1111 Type 2 diabetes mellitus with ketoacidosis with coma: Secondary | ICD-10-CM | POA: Diagnosis not present

## 2020-07-11 DIAGNOSIS — I495 Sick sinus syndrome: Secondary | ICD-10-CM | POA: Diagnosis present

## 2020-07-11 DIAGNOSIS — I959 Hypotension, unspecified: Secondary | ICD-10-CM | POA: Diagnosis present

## 2020-07-11 DIAGNOSIS — Z950 Presence of cardiac pacemaker: Secondary | ICD-10-CM | POA: Diagnosis not present

## 2020-07-11 DIAGNOSIS — E1110 Type 2 diabetes mellitus with ketoacidosis without coma: Secondary | ICD-10-CM | POA: Diagnosis present

## 2020-07-11 DIAGNOSIS — E1100 Type 2 diabetes mellitus with hyperosmolarity without nonketotic hyperglycemic-hyperosmolar coma (NKHHC): Secondary | ICD-10-CM | POA: Diagnosis present

## 2020-07-11 DIAGNOSIS — Z8505 Personal history of malignant neoplasm of liver: Secondary | ICD-10-CM | POA: Diagnosis not present

## 2020-07-11 LAB — BLOOD GAS, VENOUS
Acid-base deficit: 2.8 mmol/L — ABNORMAL HIGH (ref 0.0–2.0)
Bicarbonate: 24.1 mmol/L (ref 20.0–28.0)
FIO2: 60
Hi Frequency JET Vent Rate: 16
MECHVT: 500 mL
O2 Saturation: 78.6 %
PEEP: 8 cmH2O
Patient temperature: 37
RATE: 16 resp/min
pCO2, Ven: 49 mmHg (ref 44.0–60.0)
pH, Ven: 7.3 (ref 7.250–7.430)
pO2, Ven: 48 mmHg — ABNORMAL HIGH (ref 32.0–45.0)

## 2020-07-11 LAB — HEMOGLOBIN A1C
Hgb A1c MFr Bld: 10.8 % — ABNORMAL HIGH (ref 4.8–5.6)
Mean Plasma Glucose: 263.26 mg/dL

## 2020-07-11 LAB — LACTIC ACID, PLASMA: Lactic Acid, Venous: 3.8 mmol/L (ref 0.5–1.9)

## 2020-07-11 LAB — MRSA PCR SCREENING: MRSA by PCR: NEGATIVE

## 2020-07-11 LAB — CBG MONITORING, ED
Glucose-Capillary: 592 mg/dL (ref 70–99)
Glucose-Capillary: >600 mg/dL (ref 70–99)
Glucose-Capillary: >600 mg/dL (ref 70–99)

## 2020-07-11 LAB — BASIC METABOLIC PANEL
Anion gap: 24 — ABNORMAL HIGH (ref 5–15)
Anion gap: 18 — ABNORMAL HIGH (ref 5–15)
Anion gap: 14 (ref 5–15)
Anion gap: 10 (ref 5–15)
BUN: 49 mg/dL — ABNORMAL HIGH (ref 8–23)
BUN: 49 mg/dL — ABNORMAL HIGH (ref 8–23)
BUN: 48 mg/dL — ABNORMAL HIGH (ref 8–23)
BUN: 50 mg/dL — ABNORMAL HIGH (ref 8–23)
CO2: 19 mmol/L — ABNORMAL LOW (ref 22–32)
CO2: 25 mmol/L (ref 22–32)
CO2: 25 mmol/L (ref 22–32)
CO2: 27 mmol/L (ref 22–32)
Calcium: 9.6 mg/dL (ref 8.9–10.3)
Calcium: 9.7 mg/dL (ref 8.9–10.3)
Calcium: 9.2 mg/dL (ref 8.9–10.3)
Calcium: 9.1 mg/dL (ref 8.9–10.3)
Chloride: 90 mmol/L — ABNORMAL LOW (ref 98–111)
Chloride: 94 mmol/L — ABNORMAL LOW (ref 98–111)
Chloride: 97 mmol/L — ABNORMAL LOW (ref 98–111)
Chloride: 99 mmol/L (ref 98–111)
Creatinine, Ser: 3.11 mg/dL — ABNORMAL HIGH (ref 0.61–1.24)
Creatinine, Ser: 3.03 mg/dL — ABNORMAL HIGH (ref 0.61–1.24)
Creatinine, Ser: 3.05 mg/dL — ABNORMAL HIGH (ref 0.61–1.24)
Creatinine, Ser: 2.91 mg/dL — ABNORMAL HIGH (ref 0.61–1.24)
GFR, Estimated: 20 mL/min — ABNORMAL LOW (ref >60)
GFR, Estimated: 21 mL/min — ABNORMAL LOW (ref >60)
GFR, Estimated: 21 mL/min — ABNORMAL LOW (ref >60)
GFR, Estimated: 22 mL/min — ABNORMAL LOW (ref >60)
Glucose, Bld: 530 mg/dL (ref 70–99)
Glucose, Bld: 272 mg/dL — ABNORMAL HIGH (ref 70–99)
Glucose, Bld: 178 mg/dL — ABNORMAL HIGH (ref 70–99)
Glucose, Bld: 160 mg/dL — ABNORMAL HIGH (ref 70–99)
Potassium: 3.7 mmol/L (ref 3.5–5.1)
Potassium: 3.2 mmol/L — ABNORMAL LOW (ref 3.5–5.1)
Potassium: 3 mmol/L — ABNORMAL LOW (ref 3.5–5.1)
Potassium: 2.9 mmol/L — ABNORMAL LOW (ref 3.5–5.1)
Sodium: 133 mmol/L — ABNORMAL LOW (ref 135–145)
Sodium: 137 mmol/L (ref 135–145)
Sodium: 136 mmol/L (ref 135–145)
Sodium: 136 mmol/L (ref 135–145)

## 2020-07-11 LAB — PROCALCITONIN: Procalcitonin: 0.24 ng/mL

## 2020-07-11 LAB — HEMOGLOBIN AND HEMATOCRIT, BLOOD
HCT: 33.6 % — ABNORMAL LOW (ref 39.0–52.0)
HCT: 37.9 % — ABNORMAL LOW (ref 39.0–52.0)
HCT: 40.1 % (ref 39.0–52.0)
Hemoglobin: 12.1 g/dL — ABNORMAL LOW (ref 13.0–17.0)
Hemoglobin: 13.6 g/dL (ref 13.0–17.0)
Hemoglobin: 13.6 g/dL (ref 13.0–17.0)

## 2020-07-11 LAB — OSMOLALITY: Osmolality: 336 mOsm/kg (ref 275–295)

## 2020-07-11 LAB — GLUCOSE, CAPILLARY
Glucose-Capillary: 582 mg/dL (ref 70–99)
Glucose-Capillary: 498 mg/dL — ABNORMAL HIGH (ref 70–99)
Glucose-Capillary: 429 mg/dL — ABNORMAL HIGH (ref 70–99)
Glucose-Capillary: 484 mg/dL — ABNORMAL HIGH (ref 70–99)
Glucose-Capillary: 267 mg/dL — ABNORMAL HIGH (ref 70–99)
Glucose-Capillary: 294 mg/dL — ABNORMAL HIGH (ref 70–99)
Glucose-Capillary: 278 mg/dL — ABNORMAL HIGH (ref 70–99)
Glucose-Capillary: 193 mg/dL — ABNORMAL HIGH (ref 70–99)
Glucose-Capillary: 197 mg/dL — ABNORMAL HIGH (ref 70–99)
Glucose-Capillary: 194 mg/dL — ABNORMAL HIGH (ref 70–99)
Glucose-Capillary: 165 mg/dL — ABNORMAL HIGH (ref 70–99)
Glucose-Capillary: 179 mg/dL — ABNORMAL HIGH (ref 70–99)
Glucose-Capillary: 193 mg/dL — ABNORMAL HIGH (ref 70–99)
Glucose-Capillary: 156 mg/dL — ABNORMAL HIGH (ref 70–99)
Glucose-Capillary: 157 mg/dL — ABNORMAL HIGH (ref 70–99)
Glucose-Capillary: 142 mg/dL — ABNORMAL HIGH (ref 70–99)
Glucose-Capillary: 180 mg/dL — ABNORMAL HIGH (ref 70–99)
Glucose-Capillary: 204 mg/dL — ABNORMAL HIGH (ref 70–99)

## 2020-07-11 LAB — BLOOD GAS, ARTERIAL
Acid-base deficit: 4.5 mmol/L — ABNORMAL HIGH (ref 0.0–2.0)
Bicarbonate: 20.3 mmol/L (ref 20.0–28.0)
FIO2: 40
MECHVT: 500 mL
O2 Saturation: 88.5 %
PEEP: 5 cmH2O
Patient temperature: 37
RATE: 18 resp/min
pCO2 arterial: 36 mmHg (ref 32.0–48.0)
pH, Arterial: 7.36 (ref 7.350–7.450)
pO2, Arterial: 58 mmHg — ABNORMAL LOW (ref 83.0–108.0)

## 2020-07-11 LAB — CBC
HCT: 38.9 % — ABNORMAL LOW (ref 39.0–52.0)
Hemoglobin: 13.4 g/dL (ref 13.0–17.0)
MCH: 31.2 pg (ref 26.0–34.0)
MCHC: 34.4 g/dL (ref 30.0–36.0)
MCV: 90.5 fL (ref 80.0–100.0)
Platelets: 105 10*3/uL — ABNORMAL LOW (ref 150–400)
RBC: 4.3 MIL/uL (ref 4.22–5.81)
RDW: 12.2 % (ref 11.5–15.5)
WBC: 9.3 10*3/uL (ref 4.0–10.5)
nRBC: 0 % (ref 0.0–0.2)

## 2020-07-11 LAB — PHOSPHORUS: Phosphorus: 3.4 mg/dL (ref 2.5–4.6)

## 2020-07-11 LAB — BETA-HYDROXYBUTYRIC ACID: Beta-Hydroxybutyric Acid: 7.62 mmol/L — ABNORMAL HIGH (ref 0.05–0.27)

## 2020-07-11 LAB — TRIGLYCERIDES: Triglycerides: 239 mg/dL — ABNORMAL HIGH (ref <150)

## 2020-07-11 LAB — AMMONIA: Ammonia: 23 umol/L (ref 9–35)

## 2020-07-11 LAB — MAGNESIUM: Magnesium: 2.1 mg/dL (ref 1.7–2.4)

## 2020-07-11 MED ORDER — SILDENAFIL CITRATE 20 MG PO TABS
40.0000 mg | ORAL_TABLET | Freq: Three times a day (TID) | ORAL | Status: DC
  Filled 2020-07-11: qty 2

## 2020-07-11 MED ORDER — INSULIN ASPART 100 UNIT/ML IJ SOLN
0.0000 [IU] | INTRAMUSCULAR | Status: DC
  Administered 2020-07-11: 3 [IU] via SUBCUTANEOUS
  Administered 2020-07-12 (×6): 2 [IU] via SUBCUTANEOUS
  Administered 2020-07-13: 5 [IU] via SUBCUTANEOUS
  Administered 2020-07-13: 2 [IU] via SUBCUTANEOUS
  Administered 2020-07-13: 5 [IU] via SUBCUTANEOUS
  Administered 2020-07-13: 3 [IU] via SUBCUTANEOUS
  Administered 2020-07-13: 5 [IU] via SUBCUTANEOUS
  Administered 2020-07-14 (×2): 9 [IU] via SUBCUTANEOUS
  Filled 2020-07-11 (×14): qty 1

## 2020-07-11 MED ORDER — CHLORHEXIDINE GLUCONATE 0.12% ORAL RINSE (MEDLINE KIT)
15.0000 mL | Freq: Two times a day (BID) | OROMUCOSAL | Status: DC
  Administered 2020-07-11 – 2020-07-14 (×7): 15 mL via OROMUCOSAL

## 2020-07-11 MED ORDER — DEXTROSE 50 % IV SOLN: 0.0000 mL | INTRAVENOUS | Status: DC | PRN

## 2020-07-11 MED ORDER — NOREPINEPHRINE 4 MG/250ML-% IV SOLN
2.0000 ug/min | INTRAVENOUS | Status: DC
  Administered 2020-07-11: 6 ug/min via INTRAVENOUS
  Filled 2020-07-11: qty 250

## 2020-07-11 MED ORDER — DEXMEDETOMIDINE HCL IN NACL 400 MCG/100ML IV SOLN
0.0000 ug/kg/h | INTRAVENOUS | Status: DC
  Administered 2020-07-11: 0.8 ug/kg/h via INTRAVENOUS
  Administered 2020-07-11: 0.4 ug/kg/h via INTRAVENOUS
  Administered 2020-07-12: 1.2 ug/kg/h via INTRAVENOUS
  Administered 2020-07-12: 1.1 ug/kg/h via INTRAVENOUS
  Administered 2020-07-12 – 2020-07-13 (×5): 1.2 ug/kg/h via INTRAVENOUS
  Filled 2020-07-11 (×9): qty 100

## 2020-07-11 MED ORDER — DEXTROSE IN LACTATED RINGERS 5 % IV SOLN: INTRAVENOUS | Status: DC

## 2020-07-11 MED ORDER — DOCUSATE SODIUM 100 MG PO CAPS: 100.0000 mg | ORAL_CAPSULE | Freq: Two times a day (BID) | ORAL | Status: DC | PRN

## 2020-07-11 MED ORDER — FENTANYL CITRATE (PF) 100 MCG/2ML IJ SOLN: 25.0000 ug | INTRAMUSCULAR | Status: DC | PRN

## 2020-07-11 MED ORDER — LACTATED RINGERS IV SOLN: INTRAVENOUS | Status: DC

## 2020-07-11 MED ORDER — NOREPINEPHRINE 4 MG/250ML-% IV SOLN
INTRAVENOUS | Status: AC
  Administered 2020-07-11: 2 ug/min via INTRAVENOUS
  Filled 2020-07-11: qty 250

## 2020-07-11 MED ORDER — POTASSIUM CHLORIDE 10 MEQ/100ML IV SOLN
10.0000 meq | INTRAVENOUS | Status: AC
  Administered 2020-07-11 (×2): 10 meq via INTRAVENOUS
  Filled 2020-07-11 (×2): qty 100

## 2020-07-11 MED ORDER — DOCUSATE SODIUM 50 MG/5ML PO LIQD
100.0000 mg | Freq: Two times a day (BID) | ORAL | Status: DC
  Administered 2020-07-11 – 2020-07-15 (×8): 100 mg
  Filled 2020-07-11 (×11): qty 10

## 2020-07-11 MED ORDER — CYCLOSPORINE MODIFIED (NEORAL) 100 MG/ML PO SOLN
200.0000 mg | Freq: Every day | ORAL | Status: DC
  Administered 2020-07-12 – 2020-07-14 (×3): 200 mg
  Filled 2020-07-11 (×4): qty 2

## 2020-07-11 MED ORDER — SILDENAFIL CITRATE 20 MG PO TABS
40.0000 mg | ORAL_TABLET | Freq: Three times a day (TID) | ORAL | Status: DC
  Administered 2020-07-11 – 2020-07-12 (×6): 40 mg
  Filled 2020-07-11 (×5): qty 2

## 2020-07-11 MED ORDER — CHLORHEXIDINE GLUCONATE CLOTH 2 % EX PADS
6.0000 | MEDICATED_PAD | Freq: Every day | CUTANEOUS | Status: DC
  Administered 2020-07-11 – 2020-07-15 (×5): 6 via TOPICAL

## 2020-07-11 MED ORDER — MIDAZOLAM HCL 2 MG/2ML IJ SOLN
1.0000 mg | INTRAMUSCULAR | Status: AC | PRN
  Administered 2020-07-11 (×3): 1 mg via INTRAVENOUS
  Filled 2020-07-11 (×4): qty 2

## 2020-07-11 MED ORDER — CYCLOSPORINE MODIFIED (NEORAL) 25 MG PO CAPS
75.0000 mg | ORAL_CAPSULE | Freq: Two times a day (BID) | ORAL | Status: DC
  Filled 2020-07-11 (×2): qty 3

## 2020-07-11 MED ORDER — INSULIN GLARGINE 100 UNIT/ML ~~LOC~~ SOLN
10.0000 [IU] | Freq: Every day | SUBCUTANEOUS | Status: DC
  Administered 2020-07-11 – 2020-07-12 (×2): 10 [IU] via SUBCUTANEOUS
  Filled 2020-07-11 (×3): qty 0.1

## 2020-07-11 MED ORDER — FENTANYL 2500MCG IN NS 250ML (10MCG/ML) PREMIX INFUSION
0.0000 ug/h | INTRAVENOUS | Status: DC
  Administered 2020-07-11: 25 ug/h via INTRAVENOUS
  Administered 2020-07-12: 150 ug/h via INTRAVENOUS
  Filled 2020-07-11 (×3): qty 250

## 2020-07-11 MED ORDER — PANTOPRAZOLE SODIUM 40 MG IV SOLR
40.0000 mg | Freq: Two times a day (BID) | INTRAVENOUS | Status: DC
  Administered 2020-07-11 – 2020-07-17 (×12): 40 mg via INTRAVENOUS
  Filled 2020-07-11 (×12): qty 40

## 2020-07-11 MED ORDER — CYCLOSPORINE MODIFIED (NEORAL) 100 MG/ML PO SOLN
175.0000 mg | Freq: Every day | ORAL | Status: DC
  Administered 2020-07-11 – 2020-07-13 (×3): 175 mg
  Filled 2020-07-11 (×4): qty 1.8
  Filled 2020-07-11: qty 1.75
  Filled 2020-07-11: qty 1.8
  Filled 2020-07-11: qty 1.75

## 2020-07-11 MED ORDER — POLYETHYLENE GLYCOL 3350 17 G PO PACK: 17.0000 g | PACK | Freq: Every day | ORAL | Status: DC | PRN

## 2020-07-11 MED ORDER — HEPARIN SODIUM (PORCINE) 5000 UNIT/ML IJ SOLN
5000.0000 [IU] | Freq: Three times a day (TID) | INTRAMUSCULAR | Status: DC
  Administered 2020-07-11 (×2): 5000 [IU] via SUBCUTANEOUS
  Filled 2020-07-11 (×2): qty 1

## 2020-07-11 MED ORDER — ORAL CARE MOUTH RINSE
15.0000 mL | OROMUCOSAL | Status: DC
  Administered 2020-07-11 – 2020-07-14 (×24): 15 mL via OROMUCOSAL

## 2020-07-11 MED ORDER — SILDENAFIL CITRATE 20 MG PO TABS
40.0000 mg | ORAL_TABLET | Freq: Three times a day (TID) | ORAL | Status: DC
  Administered 2020-07-11: 40 mg via ORAL
  Filled 2020-07-11: qty 2

## 2020-07-11 MED ORDER — POTASSIUM CHLORIDE 20 MEQ PO PACK
40.0000 meq | PACK | Freq: Once | ORAL | Status: AC
  Administered 2020-07-11: 40 meq
  Filled 2020-07-11: qty 2

## 2020-07-11 MED ORDER — SODIUM CHLORIDE 0.9 % IV SOLN
3.0000 g | Freq: Two times a day (BID) | INTRAVENOUS | Status: DC
  Administered 2020-07-11 – 2020-07-12 (×4): 3 g via INTRAVENOUS
  Filled 2020-07-11: qty 3
  Filled 2020-07-11 (×2): qty 8
  Filled 2020-07-11: qty 3
  Filled 2020-07-11 (×2): qty 8

## 2020-07-11 MED ORDER — POTASSIUM CHLORIDE 10 MEQ/100ML IV SOLN
10.0000 meq | INTRAVENOUS | Status: AC
  Administered 2020-07-11 – 2020-07-12 (×2): 10 meq via INTRAVENOUS
  Filled 2020-07-11 (×2): qty 100

## 2020-07-11 MED ORDER — CYCLOSPORINE MODIFIED (NEORAL) 100 MG PO CAPS
100.0000 mg | ORAL_CAPSULE | Freq: Two times a day (BID) | ORAL | Status: DC
Start: 2020-07-11 — End: 2020-07-11
  Filled 2020-07-11: qty 1

## 2020-07-11 MED ORDER — LACTATED RINGERS IV BOLUS
20.0000 mL/kg | Freq: Once | INTRAVENOUS | Status: AC
  Administered 2020-07-11: 1000 mL via INTRAVENOUS

## 2020-07-11 MED ORDER — ALBUTEROL SULFATE (2.5 MG/3ML) 0.083% IN NEBU: 2.5000 mg | INHALATION_SOLUTION | RESPIRATORY_TRACT | Status: DC | PRN

## 2020-07-11 MED ORDER — INSULIN REGULAR(HUMAN) IN NACL 100-0.9 UT/100ML-% IV SOLN
INTRAVENOUS | Status: DC
  Administered 2020-07-11: 8.5 [IU]/h via INTRAVENOUS
  Administered 2020-07-11: 3.8 [IU]/h via INTRAVENOUS
  Filled 2020-07-11 (×2): qty 100

## 2020-07-11 MED ORDER — MIDAZOLAM HCL 2 MG/2ML IJ SOLN
1.0000 mg | INTRAMUSCULAR | Status: DC | PRN
  Administered 2020-07-11 – 2020-07-12 (×3): 1 mg via INTRAVENOUS
  Filled 2020-07-11 (×7): qty 2

## 2020-07-11 MED ORDER — FENTANYL CITRATE (PF) 100 MCG/2ML IJ SOLN
25.0000 ug | INTRAMUSCULAR | Status: DC | PRN
Start: 2020-07-11 — End: 2020-07-13
  Administered 2020-07-11: 25 ug via INTRAVENOUS
  Filled 2020-07-11: qty 2

## 2020-07-11 MED ORDER — POLYETHYLENE GLYCOL 3350 17 G PO PACK
17.0000 g | PACK | Freq: Every day | ORAL | Status: DC
  Administered 2020-07-12 – 2020-07-15 (×4): 17 g
  Filled 2020-07-11 (×5): qty 1

## 2020-07-11 MED ORDER — PANTOPRAZOLE SODIUM 40 MG IV SOLR: 40.0000 mg | Freq: Every day | INTRAVENOUS | Status: DC

## 2020-07-11 MED ORDER — SODIUM CHLORIDE 0.9 % IV SOLN
250.0000 mL | INTRAVENOUS | Status: DC
  Administered 2020-07-11 – 2020-07-13 (×3): 250 mL via INTRAVENOUS

## 2020-07-11 NOTE — ED Notes (Signed)
Charge RN April B. RN was contacted by this Probation officer and asked to contact MD for additional med for sedation.

## 2020-07-11 NOTE — ED Notes (Signed)
B/P cuff was changed to another size and placed on right arm. Patient is resting with eyes closed.

## 2020-07-11 NOTE — Progress Notes (Signed)
Inpatient Diabetes Program Recommendations  AACE/ADA: New Consensus Statement on Inpatient Glycemic Control (2015)  Target Ranges:  Prepandial:   less than 140 mg/dL      Peak postprandial:   less than 180 mg/dL (1-2 hours)      Critically ill patients:  140 - 180 mg/dL   Lab Results  Component Value Date   GLUCAP 194 (H) 07/11/2020   HGBA1C 10.8 (H) 07/11/2020    Review of Glycemic Control  Diabetes history: DM Outpatient Diabetes medications: Lantus 12 units qd + Novolog 6 units tid meal coverage Current orders for Inpatient glycemic control: IV insulin  Inpatient Diabetes Program Recommendations:   When ready for transition from IV insulin: -Give basal insulin 2 hrs. Prior to IV insulin discontinued Lantus 12 units -Novolog meal coverage tid when eating 50% meals A1c 10.8 Will follow during hospitalization.  Thank you, Nani Gasser. Nykiah Ma, RN, MSN, CDE  Diabetes Coordinator Inpatient Glycemic Control Team Team Pager [phone] (8am-5pm) 07/11/2020 12:29 PM

## 2020-07-11 NOTE — ED Notes (Signed)
Glucometer is not recognizing patient. CBG reads HI.

## 2020-07-11 NOTE — Progress Notes (Signed)
Pharmacy Electrolyte Monitoring Consult:  Pharmacy consulted to assist in monitoring and replacing electrolytes in this 70 y.o. male admitted on 07/10/2020 with Code Stroke   Labs:  Sodium (mmol/L)  Date Value  07/11/2020 136   Potassium (mmol/L)  Date Value  07/11/2020 2.9 (L)   Magnesium (mg/dL)  Date Value  07/11/2020 2.1   Phosphorus (mg/dL)  Date Value  07/11/2020 3.4   Calcium (mg/dL)  Date Value  07/11/2020 9.1   Albumin (g/dL)  Date Value  07/10/2020 4.7    Assessment/Plan: 4/30 1700:  K 2.09  Scr 2.91  Crcl 22.7 ml/min Patient is on insulin drip Will order KCL 10 meq IV x 2 bags and f/u with next BMP scheduled for ~2300.  Would Balance between conservative dosing d/t renal fxn and effect of insulin drip on K. F/u electrolytes with am labs   Paulina Fusi, PharmD, BCPS 07/11/2020 7:49 PM

## 2020-07-11 NOTE — Progress Notes (Signed)
NAME:  Hector Mueller, MRN:  [id number], DOB:  06/02/1950, LOS: 0 ADMISSION DATE:  07/10/2020, CONSULTATION DATE: 07/10/2020 REFERRING MD: Dr. Joni Fears, CHIEF COMPLAINT: Altered mental status  History of Present Illness:  70 year old male who was brought to the [facility] ED due to altered mental status.  Per ED documentation the patient was reportedly in his usual state of health last night on 07/09/20 until about 11:30 PM.  This morning the patient was noted to have difficulty walking, but was talking and interacting normally with family.  However today at noon he was found to be unable to stand or move purposefully as well as being unable to speak. ED course:  Upon arrival code stroke was initiated due to concerns for CVA with LVO and a stat neurology consult was placed.  Head CT without contrast was negative for apparent infarct or intracranial hemorrhage.  NIHSS was evaluated at 20, and the patient became unable to protect his airway with gurgling respirations requiring supplemental oxygen and the patient was emergently intubated requiring mechanical ventilation.  Per ED provider patient had some jerking movements while they were preparing for intubation but was continuing to move left upper extremity purposefully.  Evidence of gastric contents coming up the endotracheal tube and after OG tube was inserted dark reddish-black liquid with coffee-ground emesis. CTA/CTP performed without evidence of LVO.  Initial vitals: Tachypneic at 30, tachycardic at 114, hypertensive at 178/100 with SPO2 92% on room air. Significant labs: Hyponatremic at 133, hyperglycemia at 667 indicative of HHS, chloride reduced at 81, AKI with BUN/Cr: 53/3.35, anion gap elevated at 27, leukocytosis at 13.6, lactic acidosis at 3.2 > 3.8.    Pertinent  Medical History  Cirrhosis status post liver transplant in 2018 Pulmonary arterial hypertension Hepatitis C Type 2 diabetes mellitus Atrial fibrillation BPH Liver cancer Substance  abuse Tracheostomy Hyperlipidemia Significant Hospital Events: Including procedures, antibiotic start and stop dates in addition to other pertinent events   . 07/10/2020-patient evaluated for code stroke, intubated in ED for airway protection and admitted to ICU.  Interim History / Subjective:  Patient intubated and sedated, unable to follow commands at this time.  Objective   Blood pressure (!) 91/49, pulse 70, temperature 98.6 F (37 C), temperature source Axillary, resp. rate 16, height 6' (1.829 m), weight 70 kg, SpO2 96 %.    Vent Mode: PRVC FiO2 (%):  [30 %-60 %] 30 % Set Rate:  [16 bmp] 16 bmp Vt Set:  [500 mL] 500 mL PEEP:  [5 cmH20] 5 cmH20 Plateau Pressure:  [19 cmH20] 19 cmH20   Intake/Output Summary (Last 24 hours) at 07/11/2020 1646 Last data filed at 07/11/2020 1631 Gross per 24 hour  Intake 3650.43 ml  Output 535 ml  Net 3115.43 ml   Filed Weights   07/10/20 2107 07/11/20 0500  Weight: 68.9 kg 70 kg    Examination: General: Adult male, critically ill, lying in bed intubated & sedated requiring mechanical ventilation, NAD HEENT: MM pink/moist, anicteric, atraumatic, neck supple Neuro: Sedated, unable to follow commands commands, PERRL +3 CV: s1s2 RRR, NSR on monitor, no r/m/g Pulm: Regular, non labored on PRVC at 40% FiO2 and PEEP of 5, breath sounds diminished throughout GI: soft, rounded, non tender, bs x 4 GU: foley in place with clear yellow urine Skin: Limited exam- no rashes/lesions noted Extremities: warm/dry, pulses + 2 R/P, trace edema noted BLE  Labs/imaging that I have personally reviewed  (right click and "Reselect all SmartList Selections" daily)  Na+/ K+: 133/4.9 BUN/Cr.:  53/3.35 Serum CO2/ AG: 25/27  Hgb: 16  WBC/ TMAX: 13.6/afebrile Lactic/ PCT: 3.2 > 3.8  VBG: 7.30/49/48/24.1 CXR 07/10/2020: No active disease Resolved Hospital Problem list     Assessment & Plan:  Acute Hypoxic Respiratory Failure secondary to acute  encephalopathy in the setting of a suspected CVA - Ventilator settings: PRVC  8 mL/kg, 40% FiO2, 5 PEEP, continue ventilator support & lung protective strategies - Wean PEEP & FiO2 as tolerated, maintain SpO2 > 90% - Head of bed elevated 30 degrees, VAP protocol in place - Plateau pressures less than 30 cm H20  - Intermittent chest x-ray & ABG PRN - Daily WUA with SBT as tolerated  - Ensure adequate pulmonary hygiene  - F/u cultures, trend PCT - Continue Aspiration Pna coverage unasyn - bronchodilators PRN - PAD protocol in place: continue Fentanyl & Propofol  HHS PMHx: T2DM Unclear if patient was taking insulin regularly in the setting of acute encephalopathy pH 7.30, CO2 25, Anion Gap 27, serum glucose: 667, Potassium: 4.9  -HHS protocol initiated - Aggressive IV hydration, when blood glucose falls below 250 add D5 to IV fluids - Insulin drip ordered per Endo tool protocol - Strict I/O's: alert provider if UOP < 0.5 mL/kg/hr - Q 4 BMP, closely monitor potassium, replace electrolytes PRN - monitor blood glucose every 1 hour, per Endo tool protocol - trend serum CO2/ AG/ Lactic, f/u A1C - Diabetes coordinator consult - Resume home insulin regiment once appropriate  Acute encephalopathy due to suspected CVA Initial imaging negative for any intracranial abnormality.  Patient is outside the window for tPA, negative for LVO. -Supportive care -We will need follow-up MRI -Consider neurological consult as needed -Frequent neuro checks, wean sedatives as tolerated  Acute Kidney Injury on CKD Baseline Cr: 1.6, Cr on admission: 3.35 - Strict I/O's: alert provider if UOP < 0.5 mL/kg/hr - gentle IVF hydration  - Daily BMP, replace electrolytes PRN - Avoid nephrotoxic agents as able, ensure adequate renal perfusion - Consult nephrology if iHD or CRRT indicated  - consider renal US  Coffee-ground emesis Thrombocytopenia  Cirrhosis status post liver transplant in 2018 Minimal  coffee-ground emesis from OG tube, patient hemodynamically stable even on sedation and CBC grossly normal.  Will monitor for now Patient takes cyclosporine outpatient. -Continue cyclosporine home regimen -Trend hepatic function -Daily CBC -Monitor for signs and symptoms of bleeding -Transfuse if hemoglobin less than 7 - Consider transfusion of platelets if < 10  Pulmonary arterial hypertension Who class I -Continue home sildenafil -Continuous cardiac monitoring -Consult cardiology if unable to tolerate home regimen  Best practice (right click and "Reselect all SmartList Selections" daily)  Diet:  NPO Pain/Anxiety/Delirium protocol (if indicated): Yes (RASS goal -1) VAP protocol (if indicated): Yes DVT prophylaxis: Subcutaneous Heparin GI prophylaxis: PPI Glucose control:  Insulin gtt Central venous access:  N/A Arterial line:  N/A Foley:  Yes, and it is still needed Mobility:  bed rest  PT consulted: N/A Last date of multidisciplinary goals of care discussion 07/10/20 Code Status:  full code Disposition: ICU  Labs   CBC: Recent Labs  Lab 07/10/20 2109 07/11/20 0316 07/11/20 1228  WBC 13.6* 9.3  --   NEUTROABS 12.3*  --   --   HGB 16.0 13.4 13.6  HCT 46.1 38.9* 37.9*  MCV 87.8 90.5  --   PLT 128* 105*  --     Basic Metabolic Panel: Recent Labs  Lab 07/10/20 2109 07/11/20 0316 07/11/20 0820 07/11/20 1228  NA 133* 133*  137 136  K 4.9 3.7 3.2* 3.0*  CL 81* 90* 94* 97*  CO2 25 19* 25 25  GLUCOSE 667* 530* 272* 178*  BUN 53* 49* 49* 48*  CREATININE 3.35* 3.11* 3.03* 3.05*  CALCIUM 10.7* 9.6 9.7 9.2  MG  --  2.1  --   --   PHOS  --  3.4  --   --    GFR: Estimated Creatinine Clearance: 21.7 mL/min (A) (by C-G formula based on SCr of 3.05 mg/dL (H)). Recent Labs  Lab 07/10/20 2109 07/10/20 2155 07/10/20 2355 07/11/20 0316  PROCALCITON  --   --   --  0.24  WBC 13.6*  --   --  9.3  LATICACIDVEN  --  3.2* 3.8*  --     Liver Function Tests: Recent Labs   Lab 07/10/20 2109  AST 14*  ALT 11  ALKPHOS 88  BILITOT 1.7*  PROT 7.9  ALBUMIN 4.7   No results for input(s): LIPASE, AMYLASE in the last 168 hours. Recent Labs  Lab 07/11/20 0316  AMMONIA 23    ABG    Component Value Date/Time   PHART 7.36 07/11/2020 0013   PCO2ART 36 07/11/2020 0013   PO2ART 58 (L) 07/11/2020 0013   HCO3 20.3 07/11/2020 0013   ACIDBASEDEF 4.5 (H) 07/11/2020 0013   O2SAT 88.5 07/11/2020 0013     Coagulation Profile: Recent Labs  Lab 07/10/20 2109  INR 1.0    Cardiac Enzymes: No results for input(s): CKTOTAL, CKMB, CKMBINDEX, TROPONINI in the last 168 hours.  HbA1C: Hgb A1c MFr Bld  Date/Time Value Ref Range Status  07/11/2020 03:16 AM 10.8 (H) 4.8 - 5.6 % Final    Comment:    (NOTE) Pre diabetes:          5.7%-6.4%  Diabetes:              >6.4%  Glycemic control for   <7.0% adults with diabetes     CBG: Recent Labs  Lab 07/11/20 1131 07/11/20 1246 07/11/20 1356 07/11/20 1547 07/11/20 1641  GLUCAP 194* 165* 179* 193* 156*    Review of Systems:   UTA patient intubated and sedated  Past Medical History:  He,  has a past medical history of Atrial fibrillation ([place]), BPH (benign prostatic hyperplasia), Cancer ([place]), Cirrhosis ([place]), Diabetes mellitus without complication ([place]), GERD (gastroesophageal reflux disease), and Hepatitis C.   Surgical History:   Past Surgical History:  Procedure Laterality Date  . ANKLE FRACTURE SURGERY Bilateral   . BACK SURGERY    . CLAVICLE SURGERY    . INTRAMEDULLARY (IM) NAIL INTERTROCHANTERIC Right 11/28/2017   Procedure: INTRAMEDULLARY (IM) NAIL INTERTROCHANTRIC;  Surgeon: Earnestine Leys, MD;  Location: [facility] ORS;  Service: Orthopedics;  Laterality: Right;  . LIVER SURGERY       Social History:   reports that he has never smoked. He has never used smokeless tobacco. He reports that he does not drink alcohol.   Family History:  His family history is not on file.   Allergies No Known  Allergies   Home Medications  Prior to Admission medications   Medication Sig Start Date End Date Taking? Authorizing Provider  alendronate (FOSAMAX) 70 MG tablet  10/27/17   [provider]  aspirin 81 MG chewable tablet Chew 1 tablet (81 mg total) by mouth daily. 12/02/17   Vaughan Basta, MD  bisacodyl (DULCOLAX) 5 MG EC tablet Take 1 tablet (5 mg total) by mouth daily as needed for moderate constipation. 12/01/17  Vaughan Basta, MD  calcium carbonate (TUMS - DOSED IN MG ELEMENTAL CALCIUM) 500 MG chewable tablet Chew 2 tablets (400 mg of elemental calcium total) by mouth daily as needed for indigestion or heartburn. 12/01/17   Vaughan Basta, MD  Cholecalciferol (VITAMIN D3) 2000 units capsule Take 2,000 Units by mouth daily.    [provider]  cycloSPORINE modified (NEORAL) 100 MG capsule Take 100 mg by mouth 2 (two) times daily. Take along with 3 x 25 mg cap twice a day    [provider]  cycloSPORINE modified (NEORAL) 25 MG capsule Take 75 mg by mouth 2 (two) times daily. Take with 100 mg cap twice a day    [provider]  docusate sodium (COLACE) 100 MG capsule Take 1 capsule (100 mg total) by mouth 2 (two) times daily. 12/01/17   Vaughan Basta, MD  enoxaparin (LOVENOX) 40 MG/0.4ML injection Inject 0.4 mLs (40 mg total) into the skin daily for 12 days. 12/02/17 12/14/17  Vaughan Basta, MD  ferrous sulfate 325 (65 FE) MG tablet Take 1 tablet (325 mg total) by mouth 2 (two) times daily with a meal. 12/01/17   Vaughan Basta, MD  HYDROcodone-acetaminophen (NORCO/VICODIN) 5-325 MG tablet Take 1 tablet by mouth every 4 (four) hours as needed for moderate pain or severe pain (pain score 4-6). 12/01/17   Vaughan Basta, MD  insulin aspart (NOVOLOG) 100 UNIT/ML FlexPen Inject 6 Units into the skin 3 (three) times daily with meals.  12/09/16 02/06/18  [provider]  Insulin Glargine (LANTUS SOLOSTAR)  100 UNIT/ML Solostar Pen INJECT 12 UNITS SUBCUTANEOUSLY (UNDER SKIN) NIGHTLY 12/09/16 12/09/17  [provider]  Melatonin 3 MG TABS Take by mouth. 02/06/17   [provider]  mirtazapine (REMERON) 15 MG tablet Take 15 mg by mouth at bedtime.  10/18/17   [provider]  Multiple Vitamin (MULTIVITAMIN) tablet Take 1 tablet by mouth daily.    [provider]  ondansetron (ZOFRAN) 4 MG tablet Take 1 tablet (4 mg total) by mouth every 6 (six) hours as needed for nausea. 12/01/17   Vaughan Basta, MD  senna (SENOKOT) 8.6 MG TABS tablet Take 1 tablet (8.6 mg total) by mouth 2 (two) times daily. 12/01/17   Vaughan Basta, MD  sildenafil (REVATIO) 20 MG tablet Take 40 mg by mouth 3 (three) times daily.  10/17/17   [provider]  Specialty Vitamins Products (MAGNESIUM, AMINO ACID CHELATE,) 133 MG tablet Take by mouth. 03/02/17   [provider]  spironolactone (ALDACTONE) 50 MG tablet Take 50 mg by mouth daily. 01/07/14   [provider]     Critical care time: 33 minutes       Ottie Glazier, M.D.  [facility] [place]

## 2020-07-11 NOTE — ED Notes (Signed)
Pt being transported to ICU with RT and RN.

## 2020-07-11 NOTE — ED Notes (Signed)
Report given to [facility] in CCU.

## 2020-07-11 NOTE — Progress Notes (Signed)
Pharmacy Electrolyte Monitoring Consult:  Pharmacy consulted to assist in monitoring and replacing electrolytes in this 70 y.o. male admitted on 07/10/2020 with Code Stroke   Labs:  Sodium (mmol/L)  Date Value  07/11/2020 136   Potassium (mmol/L)  Date Value  07/11/2020 3.0 (L)   Magnesium (mg/dL)  Date Value  07/11/2020 2.1   Phosphorus (mg/dL)  Date Value  07/11/2020 3.4   Calcium (mg/dL)  Date Value  07/11/2020 9.2   Albumin (g/dL)  Date Value  07/10/2020 4.7    Assessment/Plan: 4/30 1228:  K 3.0  Scr 3.05  Crcl 21.7 ml/min Patient is on insulin drip Will order KCL 10 meq IV x 2 bags and f/u with next BMP scheduled for ~1600.  Would Balance between conservative dosing d/t renal fxn and effect of insulin drip on K. F/u electrolytes with am labs   Kacie Huxtable A 07/11/2020 1:25 PM

## 2020-07-11 NOTE — ED Notes (Signed)
Report given to Emily RN

## 2020-07-11 NOTE — Consult Note (Signed)
Pharmacy Antibiotic Note  Hector Mueller is a 70 y.o. male with medical history including cirrhosis s/p liver transplant in 2018, pulmonary arterial hypertension, T2DM, Afib, BPH admitted on 07/10/2020 with altered mental status.  Pharmacy has been consulted for Unasyn dosing for aspiration pneumonia.  Patient is currently intubated, sedated, and on mechanical ventilation. On 40% FiO2, saturating upper 90s. Afebrile, no leukocytosis. On an insulin drip for DKA. Fluid resuscitation per DKA protocol. Hypotensive on Levophed.  Plan:  Unasyn 3 g IV q12h (renally adjusted)  Height: 6' (182.9 cm) Weight: 70 kg (154 lb 5.2 oz) IBW/kg (Calculated) : 77.6  Temp (24hrs), Avg:97.9 F (36.6 C), Min:97.2 F (36.2 C), Max:98.6 F (37 C)  Recent Labs  Lab 07/10/20 2109 07/10/20 2155 07/10/20 2355 07/11/20 0316 07/11/20 0820  WBC 13.6*  --   --  9.3  --   CREATININE 3.35*  --   --  3.11* 3.03*  LATICACIDVEN  --  3.2* 3.8*  --   --     Estimated Creatinine Clearance: 21.8 mL/min (A) (by C-G formula based on SCr of 3.03 mg/dL (H)).    No Known Allergies  Antimicrobials this admission: Unasyn 4/29 >>   Dose adjustments this admission: N/A  Microbiology results: 4/29 BCx (1-set): NGTD 4/30 MRSA PCR: (-)  Thank you for allowing pharmacy to be a part of this patient's care.  Benita Gutter 07/11/2020 10:18 AM

## 2020-07-11 NOTE — Progress Notes (Signed)
Patient's RN reports dark coffee-ground fluid from the NG/OG tube.  Per patient's wife who is currently at the bedside, he has had episode of abdominal pain, nausea vomiting with occasional hematemesis.  On review of his labs it appears that H&H dropped from 16.0 to 13.4 int he last 24 hours.  - H&H monitoring q6h -Transfuse PRN Hgb<7 - Pantoprazole 40mg  IV  - Hold NSAIDs, steroids, ASA, for now     Rufina Falco, DNP, CCRN, FNP-C, AGACNP-BC Acute Care Nurse Practitioner  [facility] Pager: [phone] [place] [facility]

## 2020-07-12 ENCOUNTER — Inpatient Hospital Stay: Payer: Medicare Other

## 2020-07-12 DIAGNOSIS — J9601 Acute respiratory failure with hypoxia: Secondary | ICD-10-CM | POA: Diagnosis not present

## 2020-07-12 LAB — BASIC METABOLIC PANEL
Anion gap: 12 (ref 5–15)
Anion gap: 12 (ref 5–15)
BUN: 46 mg/dL — ABNORMAL HIGH (ref 8–23)
BUN: 47 mg/dL — ABNORMAL HIGH (ref 8–23)
CO2: 23 mmol/L (ref 22–32)
CO2: 26 mmol/L (ref 22–32)
Calcium: 9 mg/dL (ref 8.9–10.3)
Calcium: 9.1 mg/dL (ref 8.9–10.3)
Chloride: 101 mmol/L (ref 98–111)
Chloride: 99 mmol/L (ref 98–111)
Creatinine, Ser: 2.85 mg/dL — ABNORMAL HIGH (ref 0.61–1.24)
Creatinine, Ser: 2.89 mg/dL — ABNORMAL HIGH (ref 0.61–1.24)
GFR, Estimated: 22 mL/min — ABNORMAL LOW (ref >60)
GFR, Estimated: 23 mL/min — ABNORMAL LOW (ref >60)
Glucose, Bld: 170 mg/dL — ABNORMAL HIGH (ref 70–99)
Glucose, Bld: 176 mg/dL — ABNORMAL HIGH (ref 70–99)
Potassium: 3.4 mmol/L — ABNORMAL LOW (ref 3.5–5.1)
Potassium: 3.6 mmol/L (ref 3.5–5.1)
Sodium: 136 mmol/L (ref 135–145)
Sodium: 137 mmol/L (ref 135–145)

## 2020-07-12 LAB — PROTIME-INR
INR: 1.1 (ref 0.8–1.2)
Prothrombin Time: 13.8 seconds (ref 11.4–15.2)

## 2020-07-12 LAB — LIPID PANEL
Cholesterol: 127 mg/dL (ref 0–200)
HDL: 43 mg/dL (ref >40)
LDL Cholesterol: 42 mg/dL (ref 0–99)
Total CHOL/HDL Ratio: 3 RATIO
Triglycerides: 208 mg/dL — ABNORMAL HIGH (ref <150)
VLDL: 42 mg/dL — ABNORMAL HIGH (ref 0–40)

## 2020-07-12 LAB — CBC
HCT: 35 % — ABNORMAL LOW (ref 39.0–52.0)
Hemoglobin: 12.3 g/dL — ABNORMAL LOW (ref 13.0–17.0)
MCH: 30.6 pg (ref 26.0–34.0)
MCHC: 35.1 g/dL (ref 30.0–36.0)
MCV: 87.1 fL (ref 80.0–100.0)
Platelets: 113 10*3/uL — ABNORMAL LOW (ref 150–400)
RBC: 4.02 MIL/uL — ABNORMAL LOW (ref 4.22–5.81)
RDW: 12.5 % (ref 11.5–15.5)
WBC: 11.9 10*3/uL — ABNORMAL HIGH (ref 4.0–10.5)
nRBC: 0 % (ref 0.0–0.2)

## 2020-07-12 LAB — PHOSPHORUS
Phosphorus: <1 mg/dL — CL (ref 2.5–4.6)
Phosphorus: 4 mg/dL (ref 2.5–4.6)
Phosphorus: 2.7 mg/dL (ref 2.5–4.6)

## 2020-07-12 LAB — GLUCOSE, CAPILLARY
Glucose-Capillary: 144 mg/dL — ABNORMAL HIGH (ref 70–99)
Glucose-Capillary: 160 mg/dL — ABNORMAL HIGH (ref 70–99)
Glucose-Capillary: 163 mg/dL — ABNORMAL HIGH (ref 70–99)
Glucose-Capillary: 169 mg/dL — ABNORMAL HIGH (ref 70–99)
Glucose-Capillary: 180 mg/dL — ABNORMAL HIGH (ref 70–99)
Glucose-Capillary: 187 mg/dL — ABNORMAL HIGH (ref 70–99)
Glucose-Capillary: 187 mg/dL — ABNORMAL HIGH (ref 70–99)
Glucose-Capillary: 188 mg/dL — ABNORMAL HIGH (ref 70–99)
Glucose-Capillary: 220 mg/dL — ABNORMAL HIGH (ref 70–99)

## 2020-07-12 LAB — MAGNESIUM
Magnesium: 1.8 mg/dL (ref 1.7–2.4)
Magnesium: 2.1 mg/dL (ref 1.7–2.4)
Magnesium: 2.1 mg/dL (ref 1.7–2.4)

## 2020-07-12 LAB — HEMOGLOBIN AND HEMATOCRIT, BLOOD
HCT: 32.8 % — ABNORMAL LOW (ref 39.0–52.0)
HCT: 41.6 % (ref 39.0–52.0)
HCT: 33.7 % — ABNORMAL LOW (ref 39.0–52.0)
HCT: 32.2 % — ABNORMAL LOW (ref 39.0–52.0)
Hemoglobin: 11.5 g/dL — ABNORMAL LOW (ref 13.0–17.0)
Hemoglobin: 14.2 g/dL (ref 13.0–17.0)
Hemoglobin: 11.8 g/dL — ABNORMAL LOW (ref 13.0–17.0)
Hemoglobin: 11.2 g/dL — ABNORMAL LOW (ref 13.0–17.0)

## 2020-07-12 LAB — APTT: aPTT: 26 seconds (ref 24–36)

## 2020-07-12 LAB — PROCALCITONIN: Procalcitonin: 0.57 ng/mL

## 2020-07-12 MED ORDER — POTASSIUM PHOSPHATES 15 MMOLE/5ML IV SOLN
45.0000 mmol | Freq: Once | INTRAVENOUS | Status: AC
  Administered 2020-07-12: 45 mmol via INTRAVENOUS
  Filled 2020-07-12: qty 15

## 2020-07-12 MED ORDER — VITAL HIGH PROTEIN PO LIQD
1000.0000 mL | ORAL | Status: DC
  Administered 2020-07-12: 1000 mL

## 2020-07-12 MED ORDER — POTASSIUM CHLORIDE 10 MEQ/100ML IV SOLN
10.0000 meq | INTRAVENOUS | Status: DC
  Filled 2020-07-12 (×2): qty 100

## 2020-07-12 MED ORDER — MAGNESIUM SULFATE 2 GM/50ML IV SOLN
2.0000 g | Freq: Once | INTRAVENOUS | Status: AC
  Administered 2020-07-12: 2 g via INTRAVENOUS
  Filled 2020-07-12: qty 50

## 2020-07-12 MED ORDER — NOREPINEPHRINE 4 MG/250ML-% IV SOLN
0.0000 ug/min | INTRAVENOUS | Status: DC
  Administered 2020-07-12: 2 ug/min via INTRAVENOUS
  Filled 2020-07-12: qty 250

## 2020-07-12 MED ORDER — PROSOURCE TF PO LIQD
45.0000 mL | Freq: Two times a day (BID) | ORAL | Status: DC
  Administered 2020-07-12 – 2020-07-13 (×2): 45 mL
  Filled 2020-07-12 (×3): qty 45

## 2020-07-12 NOTE — Consult Note (Signed)
PHARMACY CONSULT NOTE  Pharmacy Consult for Electrolyte Monitoring and Replacement   Recent Labs: Potassium (mmol/L)  Date Value  07/12/2020 3.6   Magnesium (mg/dL)  Date Value  07/12/2020 1.8   Calcium (mg/dL)  Date Value  07/12/2020 9.0   Albumin (g/dL)  Date Value  07/10/2020 4.7   Phosphorus (mg/dL)  Date Value  07/12/2020 <1.0 (LL)   Sodium (mmol/L)  Date Value  07/12/2020 137   Assessment: Patient is a 70 y/o M with medical history including cirrhosis / liver cancer s/p liver transplant in 2018, pulmonary arterial hypertension, diabetes, Afib, BPH, substance abuse who is admitted with acute hypoxic respiratory failure secondary to acute encephalopathy / possible CVA and hyperglycemic emergency / AKI.   Patient is currently intubated, sedated, and on mechanical ventilation in the ICU. 30% FiO2. Renal dysfunction persistent despite fluid resuscitation. Requiring Levophed for blood pressure support. Patient transitioned off of IV insulin to SQ insulin.  Goal of Therapy:  Electrolytes within normal limits  Plan:  --Phos < 1, provider ordered IV potassium phosphate 45 mmol x 1 (contains 67.5 mEq K+) --Mg 1.8, provider ordered IV magnesium sulfate 2 g x 1 --Re-check phosphorous today at 1600 as ordered --Follow-up all other electrolytes with AM labs   Benita Gutter 07/12/2020 8:47 AM

## 2020-07-12 NOTE — Progress Notes (Signed)
NAME:  Hector Mueller, MRN:  [id number], DOB:  [birth year], LOS: 1 ADMISSION DATE:  07/10/2020, CONSULTATION DATE: 07/10/2020 REFERRING MD: Dr. Joni Fears, CHIEF COMPLAINT: Altered mental status  History of Present Illness:  70 year old male who was brought to the [facility] ED due to altered mental status.  Per ED documentation the patient was reportedly in his usual state of health last night on 07/09/20 until about 11:30 PM.  This morning the patient was noted to have difficulty walking, but was talking and interacting normally with family.  However today at noon he was found to be unable to stand or move purposefully as well as being unable to speak.     Events: 07/12/20- I met with family (wife and niece) updated on plan and reviewed findings. Patient with improved HHS, weaning on PRVC, coffe ground NG material resolved.     Pertinent  Medical History  Cirrhosis status post liver transplant in 2018 Pulmonary arterial hypertension Hepatitis C Type 2 diabetes mellitus Atrial fibrillation BPH Liver cancer Substance abuse Tracheostomy Hyperlipidemia   Interim History / Subjective:  Patient intubated and sedated, unable to follow commands at this time.  Objective   Blood pressure 131/74, pulse 88, temperature 98.4 F (36.9 C), temperature source Axillary, resp. rate 19, height 6' (1.829 m), weight 75.8 kg, SpO2 100 %.    Vent Mode: PRVC FiO2 (%):  [30 %] 30 % Set Rate:  [16 bmp] 16 bmp Vt Set:  [500 mL] 500 mL PEEP:  [5 XBM84-13 cmH20] 5 cmH20 Plateau Pressure:  [11 KGM01-02 cmH20] 11 cmH20   Intake/Output Summary (Last 24 hours) at 07/12/2020 1308 Last data filed at 07/12/2020 7253 Gross per 24 hour  Intake 5437.85 ml  Output 275 ml  Net 5162.85 ml   Filed Weights   07/10/20 2107 07/11/20 0500 07/12/20 0500  Weight: 68.9 kg 70 kg 75.8 kg    Examination: General: Adult male, critically ill, lying in bed intubated & sedated requiring mechanical ventilation, NAD HEENT: MM  pink/moist, anicteric, atraumatic, neck supple Neuro: Sedated, unable to follow commands commands, PERRL +3 CV: s1s2 RRR, NSR on monitor, no r/m/g Pulm: Regular, non labored on PRVC at 40% FiO2 and PEEP of 5, breath sounds diminished throughout GI: soft, rounded, non tender, bs x 4 GU: foley in place with clear yellow urine Skin: Limited exam- no rashes/lesions noted Extremities: warm/dry, pulses + 2 R/P, trace edema noted BLE    Imaging    IMPRESSION: 1. Negative CTA for emergent large vessel occlusion. 2. Negative CT perfusion with no evidence for acute ischemia or other perfusion abnormality. 3. Severe distal right P3 stenoses. 4. Otherwise wide patency of the major arterial vasculature of the head and neck. No other hemodynamically significant or correctable stenosis. 5. Question diffuse circumferential esophageal wall thickening, which could reflect changes of acute esophagitis and/or reflux disease.   Assessment & Plan:  Acute Hypoxic Respiratory Failure secondary to acute encephalopathy in the setting of HHS and possible CVA - Ventilator settings: PRVC  8 mL/kg, 40% FiO2, 5 PEEP, continue ventilator support & lung protective strategies - Wean PEEP & FiO2 as tolerated, maintain SpO2 > 90% - Head of bed elevated 30 degrees, VAP protocol in place - Plateau pressures less than 30 cm H20  - Intermittent chest x-ray & ABG PRN - Daily WUA with SBT as tolerated  - Ensure adequate pulmonary hygiene  - F/u cultures, trend PCT - Continue Aspiration Pna coverage unasyn - bronchodilators PRN - PAD protocol in place: continue Fentanyl &  Propofol              -patient was aggitated with failure of SBT difficult to sedate    HHS PMHx: T2DM -improved - follow DKA protocol   Acute encephalopathy due to suspected CVA Initial imaging negative for any intracranial abnormality.  Patient is outside the window for tPA, negative for LVO. -Supportive care -We will need follow-up  MRI -Consider neurological consult as needed -Frequent neuro checks, wean sedatives as tolerated  Acute Kidney Injury on CKD Baseline Cr: 1.6, Cr on admission: 3.35 - Strict I/O's: alert provider if UOP < 0.5 mL/kg/hr - gentle IVF hydration  - Daily BMP, replace electrolytes PRN - Avoid nephrotoxic agents as able, ensure adequate renal perfusion - Consult nephrology if iHD or CRRT indicated  - consider renal US  Coffee-ground emesis Thrombocytopenia  Cirrhosis status post liver transplant in 2018 Minimal coffee-ground emesis from OG tube, patient hemodynamically stable even on sedation and CBC grossly normal.  Will monitor for now Patient takes cyclosporine outpatient. -Continue cyclosporine home regimen -Trend hepatic function -Daily CBC -Monitor for signs and symptoms of bleeding -Transfuse if hemoglobin less than 7 - Consider transfusion of platelets if < 10  Pulmonary arterial hypertension Who class I -Continue home sildenafil -Continuous cardiac monitoring -Consult cardiology if unable to tolerate home regimen  Best practice (right click and "Reselect all SmartList Selections" daily)  Diet:  NPO Pain/Anxiety/Delirium protocol (if indicated): Yes (RASS goal -1) VAP protocol (if indicated): Yes DVT prophylaxis: Subcutaneous Heparin GI prophylaxis: PPI Glucose control:  Insulin gtt Central venous access:  N/A Arterial line:  N/A Foley:  Yes, and it is still needed Mobility:  bed rest  PT consulted: N/A Last date of multidisciplinary goals of care discussion 07/10/20 Code Status:  full code Disposition: ICU  Labs   CBC: Recent Labs  Lab 07/10/20 2109 07/11/20 0316 07/11/20 1228 07/11/20 1700 07/11/20 2351 07/12/20 0140 07/12/20 0433 07/12/20 1132  WBC 13.6* 9.3  --   --   --  11.9*  --   --   NEUTROABS 12.3*  --   --   --   --   --   --   --   HGB 16.0 13.4   < > 12.1* 13.6 12.3* 11.5* 14.2  HCT 46.1 38.9*   < > 33.6* 40.1 35.0* 32.8* 41.6  MCV 87.8 90.5   --   --   --  87.1  --   --   PLT 128* 105*  --   --   --  113*  --   --    < > = values in this interval not displayed.    Basic Metabolic Panel: Recent Labs  Lab 07/11/20 0316 07/11/20 0820 07/11/20 1228 07/11/20 1700 07/11/20 2351 07/12/20 0140 07/12/20 0932  NA 133* 137 136 136 136 137  --   K 3.7 3.2* 3.0* 2.9* 3.4* 3.6  --   CL 90* 94* 97* 99 101 99  --   CO2 19* _0 --   GLUCOSE 530* 272* 178* 160* 176* 170*  --   BUN 49* 49* 48* 50* 47* 46*  --   CREATININE 3.11* 3.03* 3.05* 2.91* 2.89* 2.85*  --   CALCIUM 9.6 9.7 9.2 9.1 9.1 9.0  --   MG 2.1  --   --   --   --  1.8 2.1  PHOS 3.4  --   --   --   --  <1.0* 4.0  GFR: Estimated Creatinine Clearance: 25.1 mL/min (A) (by C-G formula based on SCr of 2.85 mg/dL (H)). Recent Labs  Lab 07/10/20 2109 07/10/20 2155 07/10/20 2355 07/11/20 0316 07/12/20 0140  PROCALCITON  --   --   --  0.24 0.57  WBC 13.6*  --   --  9.3 11.9*  LATICACIDVEN  --  3.2* 3.8*  --   --     Liver Function Tests: Recent Labs  Lab 07/10/20 2109  AST 14*  ALT 11  ALKPHOS 88  BILITOT 1.7*  PROT 7.9  ALBUMIN 4.7   No results for input(s): LIPASE, AMYLASE in the last 168 hours. Recent Labs  Lab 07/11/20 0316  AMMONIA 23    ABG    Component Value Date/Time   PHART 7.36 07/11/2020 0013   PCO2ART 36 07/11/2020 0013   PO2ART 58 (L) 07/11/2020 0013   HCO3 20.3 07/11/2020 0013   ACIDBASEDEF 4.5 (H) 07/11/2020 0013   O2SAT 88.5 07/11/2020 0013     Coagulation Profile: Recent Labs  Lab 07/10/20 2109 07/12/20 0932  INR 1.0 1.1    Cardiac Enzymes: No results for input(s): CKTOTAL, CKMB, CKMBINDEX, TROPONINI in the last 168 hours.  HbA1C: Hgb A1c MFr Bld  Date/Time Value Ref Range Status  07/11/2020 03:16 AM 10.8 (H) 4.8 - 5.6 % Final    Comment:    (NOTE) Pre diabetes:          5.7%-6.4%  Diabetes:              >6.4%  Glycemic control for   <7.0% adults with diabetes     CBG: Recent Labs  Lab  07/12/20 0028 07/12/20 0235 07/12/20 0614 07/12/20 0736 07/12/20 1115  GLUCAP 187* 169* 180* 144* 163*    Review of Systems:   UTA patient intubated and sedated  Past Medical History:  He,  has a past medical history of Atrial fibrillation ([place]), BPH (benign prostatic hyperplasia), Cancer ([place]), Cirrhosis ([place]), Diabetes mellitus without complication ([place]), GERD (gastroesophageal reflux disease), and Hepatitis C.   Surgical History:   Past Surgical History:  Procedure Laterality Date  . ANKLE FRACTURE SURGERY Bilateral   . BACK SURGERY    . CLAVICLE SURGERY    . INTRAMEDULLARY (IM) NAIL INTERTROCHANTERIC Right 11/28/2017   Procedure: INTRAMEDULLARY (IM) NAIL INTERTROCHANTRIC;  Surgeon: Earnestine Leys, MD;  Location: [facility] ORS;  Service: Orthopedics;  Laterality: Right;  . LIVER SURGERY       Social History:   reports that he has never smoked. He has never used smokeless tobacco. He reports that he does not drink alcohol.   Family History:  His family history is not on file.   Allergies No Known Allergies   Home Medications  Prior to Admission medications   Medication Sig Start Date End Date Taking? Authorizing Provider  alendronate (FOSAMAX) 70 MG tablet  10/27/17   [provider]  aspirin 81 MG chewable tablet Chew 1 tablet (81 mg total) by mouth daily. 12/02/17   Vaughan Basta, MD  bisacodyl (DULCOLAX) 5 MG EC tablet Take 1 tablet (5 mg total) by mouth daily as needed for moderate constipation. 12/01/17   Vaughan Basta, MD  calcium carbonate (TUMS - DOSED IN MG ELEMENTAL CALCIUM) 500 MG chewable tablet Chew 2 tablets (400 mg of elemental calcium total) by mouth daily as needed for indigestion or heartburn. 12/01/17   Vaughan Basta, MD  Cholecalciferol (VITAMIN D3) 2000 units capsule Take 2,000 Units by mouth daily.    [provider]  cycloSPORINE modified (NEORAL) 100 MG capsule Take 100 mg by mouth 2 (two) times daily. Take along  with 3 x 25 mg cap twice a day    [provider]  cycloSPORINE modified (NEORAL) 25 MG capsule Take 75 mg by mouth 2 (two) times daily. Take with 100 mg cap twice a day    [provider]  docusate sodium (COLACE) 100 MG capsule Take 1 capsule (100 mg total) by mouth 2 (two) times daily. 12/01/17   Vaughan Basta, MD  enoxaparin (LOVENOX) 40 MG/0.4ML injection Inject 0.4 mLs (40 mg total) into the skin daily for 12 days. 12/02/17 12/14/17  Vaughan Basta, MD  ferrous sulfate 325 (65 FE) MG tablet Take 1 tablet (325 mg total) by mouth 2 (two) times daily with a meal. 12/01/17   Vaughan Basta, MD  HYDROcodone-acetaminophen (NORCO/VICODIN) 5-325 MG tablet Take 1 tablet by mouth every 4 (four) hours as needed for moderate pain or severe pain (pain score 4-6). 12/01/17   Vaughan Basta, MD  insulin aspart (NOVOLOG) 100 UNIT/ML FlexPen Inject 6 Units into the skin 3 (three) times daily with meals.  12/09/16 02/06/18  [provider]  Insulin Glargine (LANTUS SOLOSTAR) 100 UNIT/ML Solostar Pen INJECT 12 UNITS SUBCUTANEOUSLY (UNDER SKIN) NIGHTLY 12/09/16 12/09/17  [provider]  Melatonin 3 MG TABS Take by mouth. 02/06/17   [provider]  mirtazapine (REMERON) 15 MG tablet Take 15 mg by mouth at bedtime.  10/18/17   [provider]  Multiple Vitamin (MULTIVITAMIN) tablet Take 1 tablet by mouth daily.    [provider]  ondansetron (ZOFRAN) 4 MG tablet Take 1 tablet (4 mg total) by mouth every 6 (six) hours as needed for nausea. 12/01/17   Vaughan Basta, MD  senna (SENOKOT) 8.6 MG TABS tablet Take 1 tablet (8.6 mg total) by mouth 2 (two) times daily. 12/01/17   Vaughan Basta, MD  sildenafil (REVATIO) 20 MG tablet Take 40 mg by mouth 3 (three) times daily.  10/17/17   [provider]  Specialty Vitamins Products (MAGNESIUM, AMINO ACID CHELATE,) 133 MG tablet Take by mouth. 03/02/17   [provider]  spironolactone (ALDACTONE) 50 MG tablet Take 50 mg by mouth daily. 01/07/14   [provider]     Critical care time: 33 minutes       Ottie Glazier, M.D.  Pulmonary [facility] [place]

## 2020-07-12 NOTE — Procedures (Signed)
Central Venous Catheter Insertion Procedure Note  Hector Mueller  [id number]  1950-08-08  Date:07/12/20  Time:8:58 AM   Provider Performing:Brannan Cassedy A Stark Klein   Procedure: Insertion of Non-tunneled Central Venous [phone]) with US guidance (14103)   Indication(s) Medication administration and Difficult access  Consent Risks of the procedure as well as the alternatives and risks of each were explained to the patient and/or caregiver.  Consent for the procedure was obtained and is signed in the bedside chart  Anesthesia Topical only with 1% lidocaine   Timeout Verified patient identification, verified procedure, site/side was marked, verified correct patient position, special equipment/implants available, medications/allergies/relevant history reviewed, required imaging and test results available.  Sterile Technique Maximal sterile technique including full sterile barrier drape, hand hygiene, sterile gown, sterile gloves, mask, hair covering, sterile ultrasound probe cover (if used).  Procedure Description Area of catheter insertion was cleaned with chlorhexidine and draped in sterile fashion.  With real-time ultrasound guidance a central venous catheter was placed into the right internal jugular vein. Nonpulsatile blood flow and easy flushing noted in all ports.  The catheter was sutured in place and sterile dressing applied.  Complications/Tolerance None; patient tolerated the procedure well. Chest X-ray is ordered to verify placement for internal jugular or subclavian cannulation.   Chest x-ray is not ordered for femoral cannulation.  EBL Minimal  Specimen(s) None Catheter placed at 15 cm mark Biopatch applied      Rufina Falco, DNP, CCRN, FNP-C, AGACNP-BC Acute Care Nurse Practitioner  [facility] Pager: [phone] [place] [facility]

## 2020-07-12 NOTE — Progress Notes (Signed)
Pharmacy Electrolyte Monitoring Consult:  Pharmacy consulted to assist in monitoring and replacing electrolytes in this 70 y.o. male admitted on 07/10/2020 with Code Stroke   Labs:  Sodium (mmol/L)  Date Value  07/11/2020 136   Potassium (mmol/L)  Date Value  07/11/2020 3.4 (L)   Magnesium (mg/dL)  Date Value  07/11/2020 2.1   Phosphorus (mg/dL)  Date Value  07/11/2020 3.4   Calcium (mg/dL)  Date Value  07/11/2020 9.1   Albumin (g/dL)  Date Value  07/10/2020 4.7    Assessment/Plan: 4/30 1700:  K 2.09  Scr 2.91  Crcl 22.7 ml/min Patient is on insulin drip Will order KCL 10 meq IV x 2 bags and f/u with next BMP scheduled for ~2300.  Would Balance between conservative dosing d/t renal fxn and effect of insulin drip on K. F/u electrolytes with am labs  4/30 @ 2351 :  K = 3.4,  Final bag of KCl 10 mEq IV X 2 not yet given.  Nonetheless, will give additional KCl 10 mEq IV X 2 and recheck electrolytes on 5/1 @ 0500.   Kylor Valverde D 07/12/2020 1:15 AM

## 2020-07-12 NOTE — Consult Note (Signed)
PHARMACY CONSULT NOTE  Pharmacy Consult for Electrolyte Monitoring and Replacement   Recent Labs: Potassium (mmol/L)  Date Value  07/12/2020 3.6   Magnesium (mg/dL)  Date Value  07/12/2020 2.1   Calcium (mg/dL)  Date Value  07/12/2020 9.0   Albumin (g/dL)  Date Value  07/10/2020 4.7   Phosphorus (mg/dL)  Date Value  07/12/2020 2.7   Sodium (mmol/L)  Date Value  07/12/2020 137   Assessment: Patient is a 70 y/o M with medical history including cirrhosis / liver cancer s/p liver transplant in 2018, pulmonary arterial hypertension, diabetes, Afib, BPH, substance abuse who is admitted with acute hypoxic respiratory failure secondary to acute encephalopathy / possible CVA and hyperglycemic emergency / AKI.   Patient is currently intubated, sedated, and on mechanical ventilation in the ICU. 30% FiO2. Renal dysfunction persistent despite fluid resuscitation. Requiring Levophed for blood pressure support. Patient transitioned off of IV insulin to SQ insulin.  Goal of Therapy:  Electrolytes within normal limits  Plan:  --Phos and Mg now WNL s/p repletion --Follow-up electrolytes with AM labs   Mills Koller 07/12/2020 6:38 PM

## 2020-07-12 NOTE — Progress Notes (Signed)
Brief Nutrition Note  Consult received for enteral/tube feeding initiation and management.  Adult Enteral Nutrition Protocol initiated. Full assessment to follow.  Admitting Dx: Acute respiratory failure (HCC) [J96.00] Altered mental status, unspecified altered mental status type [R41.82] Diabetic ketoacidosis with coma associated with type 2 diabetes mellitus ([place]) [E11.11]  Body mass index is 22.66 kg/m. Pt meets criteria for normal based on current BMI.  Labs:  Recent Labs  Lab 07/11/20 0316 07/11/20 0820 07/11/20 1700 07/11/20 2351 07/12/20 0140 07/12/20 0932  NA 133*   < > 136 136 137  --   K 3.7   < > 2.9* 3.4* 3.6  --   CL 90*   < > 99 101 99  --   CO2 19*   < > 27 23 26   --   BUN 49*   < > 50* 47* 46*  --   CREATININE 3.11*   < > 2.91* 2.89* 2.85*  --   CALCIUM 9.6   < > 9.1 9.1 9.0  --   MG 2.1  --   --   --  1.8 2.1  PHOS 3.4  --   --   --  <1.0* 4.0  GLUCOSE 530*   < > 160* 176* 170*  --    < > = values in this interval not displayed.    Gamarra Bibles, MS, RD, LDN Inpatient Clinical Dietitian Contact information available via Amion

## 2020-07-13 ENCOUNTER — Inpatient Hospital Stay: Payer: Medicare Other

## 2020-07-13 ENCOUNTER — Inpatient Hospital Stay (HOSPITAL_COMMUNITY)
Admit: 2020-07-13 | Discharge: 2020-07-13 | Disposition: A | Discharge status: Home / Self Care | Payer: Medicare Other | Attending: Internal Medicine | Admitting: Internal Medicine

## 2020-07-13 DIAGNOSIS — E1111 Type 2 diabetes mellitus with ketoacidosis with coma: Secondary | ICD-10-CM | POA: Diagnosis not present

## 2020-07-13 DIAGNOSIS — I480 Paroxysmal atrial fibrillation: Secondary | ICD-10-CM | POA: Diagnosis not present

## 2020-07-13 DIAGNOSIS — R4182 Altered mental status, unspecified: Secondary | ICD-10-CM | POA: Diagnosis not present

## 2020-07-13 DIAGNOSIS — I4891 Unspecified atrial fibrillation: Secondary | ICD-10-CM | POA: Diagnosis not present

## 2020-07-13 DIAGNOSIS — I959 Hypotension, unspecified: Secondary | ICD-10-CM

## 2020-07-13 DIAGNOSIS — J9601 Acute respiratory failure with hypoxia: Secondary | ICD-10-CM | POA: Diagnosis not present

## 2020-07-13 DIAGNOSIS — N179 Acute kidney failure, unspecified: Secondary | ICD-10-CM

## 2020-07-13 LAB — GLUCOSE, CAPILLARY
Glucose-Capillary: 179 mg/dL — ABNORMAL HIGH (ref 70–99)
Glucose-Capillary: 212 mg/dL — ABNORMAL HIGH (ref 70–99)
Glucose-Capillary: 272 mg/dL — ABNORMAL HIGH (ref 70–99)
Glucose-Capillary: 276 mg/dL — ABNORMAL HIGH (ref 70–99)
Glucose-Capillary: 295 mg/dL — ABNORMAL HIGH (ref 70–99)
Glucose-Capillary: 440 mg/dL — ABNORMAL HIGH (ref 70–99)

## 2020-07-13 LAB — ECHOCARDIOGRAM COMPLETE
AR max vel: 2.27 cm2
AV Area VTI: 2.29 cm2
AV Area mean vel: 2.04 cm2
AV Mean grad: 3 mmHg
AV Peak grad: 5.2 mmHg
Ao pk vel: 1.14 m/s
Area-P 1/2: 3.08 cm2
Height: 72 in
S' Lateral: 2.44 cm
Weight: 2888.91 oz

## 2020-07-13 LAB — BASIC METABOLIC PANEL
Anion gap: 12 (ref 5–15)
BUN: 42 mg/dL — ABNORMAL HIGH (ref 8–23)
CO2: 22 mmol/L (ref 22–32)
Calcium: 8 mg/dL — ABNORMAL LOW (ref 8.9–10.3)
Chloride: 99 mmol/L (ref 98–111)
Creatinine, Ser: 2.66 mg/dL — ABNORMAL HIGH (ref 0.61–1.24)
GFR, Estimated: 25 mL/min — ABNORMAL LOW (ref >60)
Glucose, Bld: 292 mg/dL — ABNORMAL HIGH (ref 70–99)
Potassium: 4.1 mmol/L (ref 3.5–5.1)
Sodium: 133 mmol/L — ABNORMAL LOW (ref 135–145)

## 2020-07-13 LAB — CBC WITH DIFFERENTIAL/PLATELET
Abs Immature Granulocytes: 0.04 10*3/uL (ref 0.00–0.07)
Basophils Absolute: 0 10*3/uL (ref 0.0–0.1)
Basophils Relative: 1 %
Eosinophils Absolute: 0.1 10*3/uL (ref 0.0–0.5)
Eosinophils Relative: 1 %
HCT: 34.7 % — ABNORMAL LOW (ref 39.0–52.0)
Hemoglobin: 11.8 g/dL — ABNORMAL LOW (ref 13.0–17.0)
Immature Granulocytes: 1 %
Lymphocytes Relative: 15 %
Lymphs Abs: 1 10*3/uL (ref 0.7–4.0)
MCH: 30.4 pg (ref 26.0–34.0)
MCHC: 34 g/dL (ref 30.0–36.0)
MCV: 89.4 fL (ref 80.0–100.0)
Monocytes Absolute: 0.4 10*3/uL (ref 0.1–1.0)
Monocytes Relative: 6 %
Neutro Abs: 5 10*3/uL (ref 1.7–7.7)
Neutrophils Relative %: 76 %
Platelets: 119 10*3/uL — ABNORMAL LOW (ref 150–400)
RBC: 3.88 MIL/uL — ABNORMAL LOW (ref 4.22–5.81)
RDW: 12.8 % (ref 11.5–15.5)
Smear Review: NORMAL
WBC: 6.6 10*3/uL (ref 4.0–10.5)
nRBC: 0 % (ref 0.0–0.2)

## 2020-07-13 LAB — MAGNESIUM
Magnesium: 1.9 mg/dL (ref 1.7–2.4)
Magnesium: 2.3 mg/dL (ref 1.7–2.4)

## 2020-07-13 LAB — PHOSPHORUS
Phosphorus: 3.6 mg/dL (ref 2.5–4.6)
Phosphorus: 3.2 mg/dL (ref 2.5–4.6)

## 2020-07-13 LAB — HEMOGLOBIN AND HEMATOCRIT, BLOOD
HCT: 35.2 % — ABNORMAL LOW (ref 39.0–52.0)
HCT: 34.6 % — ABNORMAL LOW (ref 39.0–52.0)
HCT: 31.1 % — ABNORMAL LOW (ref 39.0–52.0)
Hemoglobin: 12.1 g/dL — ABNORMAL LOW (ref 13.0–17.0)
Hemoglobin: 11.8 g/dL — ABNORMAL LOW (ref 13.0–17.0)
Hemoglobin: 10.6 g/dL — ABNORMAL LOW (ref 13.0–17.0)

## 2020-07-13 LAB — TROPONIN I (HIGH SENSITIVITY)
Troponin I (High Sensitivity): 32 ng/L — ABNORMAL HIGH (ref <18)
Troponin I (High Sensitivity): 34 ng/L — ABNORMAL HIGH (ref <18)

## 2020-07-13 LAB — HEPARIN LEVEL (UNFRACTIONATED): Heparin Unfractionated: 0.29 IU/mL — ABNORMAL LOW (ref 0.30–0.70)

## 2020-07-13 LAB — AMMONIA: Ammonia: <9 umol/L — ABNORMAL LOW (ref 9–35)

## 2020-07-13 LAB — TSH: TSH: 0.901 u[IU]/mL (ref 0.350–4.500)

## 2020-07-13 LAB — PROCALCITONIN: Procalcitonin: 2.95 ng/mL

## 2020-07-13 MED ORDER — DILTIAZEM HCL-DEXTROSE 125-5 MG/125ML-% IV SOLN (PREMIX)
5.0000 mg/h | INTRAVENOUS | Status: DC
  Administered 2020-07-13 (×2): 15 mg/h via INTRAVENOUS
  Filled 2020-07-13 (×2): qty 125

## 2020-07-13 MED ORDER — METOPROLOL TARTRATE 25 MG PO TABS
12.5000 mg | ORAL_TABLET | Freq: Four times a day (QID) | ORAL | Status: DC
  Administered 2020-07-13: 12.5 mg
  Filled 2020-07-13: qty 1

## 2020-07-13 MED ORDER — FENTANYL CITRATE (PF) 100 MCG/2ML IJ SOLN
50.0000 ug | Freq: Once | INTRAMUSCULAR | Status: AC
Start: 2020-07-13 — End: 2020-07-13

## 2020-07-13 MED ORDER — METOPROLOL TARTRATE 5 MG/5ML IV SOLN
INTRAVENOUS | Status: AC
  Administered 2020-07-13: 5 mg via INTRAVENOUS
  Filled 2020-07-13: qty 5

## 2020-07-13 MED ORDER — DILTIAZEM HCL 25 MG/5ML IV SOLN
INTRAVENOUS | Status: AC
  Administered 2020-07-13: 10 mg via INTRAVENOUS
  Filled 2020-07-13: qty 5

## 2020-07-13 MED ORDER — VITAL 1.5 CAL PO LIQD
1000.0000 mL | ORAL | Status: DC
  Administered 2020-07-13: 1000 mL

## 2020-07-13 MED ORDER — HEPARIN BOLUS VIA INFUSION
4100.0000 [IU] | Freq: Once | INTRAVENOUS | Status: AC
  Administered 2020-07-13: 4100 [IU] via INTRAVENOUS
  Filled 2020-07-13: qty 4100

## 2020-07-13 MED ORDER — MAGNESIUM SULFATE 2 GM/50ML IV SOLN
2.0000 g | Freq: Once | INTRAVENOUS | Status: AC
  Administered 2020-07-13: 2 g via INTRAVENOUS
  Filled 2020-07-13: qty 50

## 2020-07-13 MED ORDER — AMIODARONE HCL IN DEXTROSE 360-4.14 MG/200ML-% IV SOLN
30.0000 mg/h | INTRAVENOUS | Status: DC
  Administered 2020-07-13 – 2020-07-14 (×2): 30 mg/h via INTRAVENOUS
  Filled 2020-07-13 (×3): qty 200

## 2020-07-13 MED ORDER — AMIODARONE LOAD VIA INFUSION
150.0000 mg | Freq: Once | INTRAVENOUS | Status: AC
  Administered 2020-07-13: 150 mg via INTRAVENOUS
  Filled 2020-07-13: qty 83.34

## 2020-07-13 MED ORDER — FENTANYL CITRATE (PF) 100 MCG/2ML IJ SOLN
INTRAMUSCULAR | Status: AC
  Administered 2020-07-13: 50 ug via INTRAVENOUS
  Filled 2020-07-13: qty 2

## 2020-07-13 MED ORDER — AMIODARONE HCL IN DEXTROSE 360-4.14 MG/200ML-% IV SOLN
60.0000 mg/h | INTRAVENOUS | Status: DC
  Administered 2020-07-13 (×2): 60 mg/h via INTRAVENOUS
  Filled 2020-07-13: qty 200

## 2020-07-13 MED ORDER — MYCOPHENOLATE 200 MG/ML ORAL SUSPENSION
500.0000 mg | Freq: Two times a day (BID) | ORAL | Status: DC
  Administered 2020-07-13 – 2020-07-14 (×2): 500 mg
  Filled 2020-07-13 (×6): qty 10

## 2020-07-13 MED ORDER — HEPARIN (PORCINE) 25000 UT/250ML-% IV SOLN
1500.0000 [IU]/h | INTRAVENOUS | Status: DC
  Administered 2020-07-13: 1350 [IU]/h via INTRAVENOUS
  Administered 2020-07-13: 1200 [IU]/h via INTRAVENOUS
  Administered 2020-07-14 – 2020-07-18 (×6): 1500 [IU]/h via INTRAVENOUS
  Filled 2020-07-13 (×7): qty 250

## 2020-07-13 MED ORDER — PHENYLEPHRINE HCL-NACL 10-0.9 MG/250ML-% IV SOLN
0.0000 ug/min | INTRAVENOUS | Status: DC
  Administered 2020-07-13: 20 ug/min via INTRAVENOUS
  Administered 2020-07-13: 70 ug/min via INTRAVENOUS
  Administered 2020-07-13: 80 ug/min via INTRAVENOUS
  Filled 2020-07-13 (×4): qty 250

## 2020-07-13 MED ORDER — MIDAZOLAM HCL 2 MG/2ML IJ SOLN
INTRAMUSCULAR | Status: AC
  Administered 2020-07-13: 2 mg via INTRAVENOUS
  Filled 2020-07-13: qty 2

## 2020-07-13 MED ORDER — FENTANYL BOLUS VIA INFUSION
100.0000 ug | Freq: Once | INTRAVENOUS | Status: AC
  Administered 2020-07-13: 100 ug via INTRAVENOUS
  Filled 2020-07-13: qty 100

## 2020-07-13 MED ORDER — INSULIN GLARGINE 100 UNIT/ML ~~LOC~~ SOLN
15.0000 [IU] | Freq: Every day | SUBCUTANEOUS | Status: DC
Start: 2020-07-13 — End: 2020-07-14
  Administered 2020-07-13: 15 [IU] via SUBCUTANEOUS
  Filled 2020-07-13: qty 0.15

## 2020-07-13 MED ORDER — PROSOURCE TF PO LIQD
45.0000 mL | Freq: Four times a day (QID) | ORAL | Status: DC
  Administered 2020-07-13 – 2020-07-14 (×3): 45 mL
  Filled 2020-07-13 (×6): qty 45

## 2020-07-13 MED ORDER — SODIUM CHLORIDE 0.9 % IV SOLN
0.0000 ug/min | INTRAVENOUS | Status: DC
  Filled 2020-07-13: qty 2

## 2020-07-13 MED ORDER — MIDAZOLAM HCL 2 MG/2ML IJ SOLN
2.0000 mg | Freq: Once | INTRAMUSCULAR | Status: AC
  Administered 2020-07-13: 2 mg via INTRAVENOUS

## 2020-07-13 MED ORDER — DILTIAZEM HCL 25 MG/5ML IV SOLN: 10.0000 mg | Freq: Once | INTRAVENOUS | Status: AC

## 2020-07-13 MED ORDER — METOPROLOL TARTRATE 5 MG/5ML IV SOLN: 5.0000 mg | Freq: Once | INTRAVENOUS | Status: AC

## 2020-07-13 MED ORDER — SODIUM CHLORIDE 0.9 % IV SOLN
3.0000 g | Freq: Two times a day (BID) | INTRAVENOUS | Status: DC
  Filled 2020-07-13 (×2): qty 8

## 2020-07-13 MED ORDER — DILTIAZEM HCL-DEXTROSE 125-5 MG/125ML-% IV SOLN (PREMIX): 5.0000 mg/h | INTRAVENOUS | Status: DC

## 2020-07-13 MED ORDER — MIDAZOLAM HCL 2 MG/2ML IJ SOLN: 2.0000 mg | Freq: Once | INTRAMUSCULAR | Status: AC

## 2020-07-13 MED ORDER — AMIODARONE HCL IN DEXTROSE 360-4.14 MG/200ML-% IV SOLN
INTRAVENOUS | Status: AC
  Filled 2020-07-13: qty 200

## 2020-07-13 MED ORDER — DILTIAZEM HCL 25 MG/5ML IV SOLN: 10.0000 mg | Freq: Once | INTRAVENOUS | Status: DC

## 2020-07-13 MED ORDER — DEXMEDETOMIDINE HCL IN NACL 400 MCG/100ML IV SOLN
0.2000 ug/kg/h | INTRAVENOUS | Status: AC
  Administered 2020-07-13: 0.8 ug/kg/h via INTRAVENOUS
  Administered 2020-07-13: 0.6 ug/kg/h via INTRAVENOUS
  Administered 2020-07-14: 0.8 ug/kg/h via INTRAVENOUS
  Filled 2020-07-13 (×3): qty 100

## 2020-07-13 MED ORDER — DILTIAZEM LOAD VIA INFUSION
10.0000 mg | Freq: Once | INTRAVENOUS | Status: AC
  Administered 2020-07-13: 10 mg via INTRAVENOUS
  Filled 2020-07-13: qty 10

## 2020-07-13 MED ORDER — PHENYLEPHRINE CONCENTRATED 100MG/250ML (0.4 MG/ML) INFUSION SIMPLE
0.0000 ug/min | INTRAVENOUS | Status: DC
  Administered 2020-07-13: 70 ug/min via INTRAVENOUS
  Filled 2020-07-13: qty 250

## 2020-07-13 MED ORDER — HEPARIN BOLUS VIA INFUSION
1250.0000 [IU] | Freq: Once | INTRAVENOUS | Status: AC
  Administered 2020-07-13: 1250 [IU] via INTRAVENOUS
  Filled 2020-07-13: qty 1250

## 2020-07-13 NOTE — Progress Notes (Addendum)
[place] for heparin infusion Indication: atrial fibrillation  No Known Allergies  Patient Measurements: Height: 6' (182.9 cm) Weight: 81.9 kg (180 lb 8.9 oz) IBW/kg (Calculated) : 77.6  Vital Signs: Temp: 98.7 F (37.1 C) (05/02 0715) Temp Source: Axillary (05/02 0715) BP: 111/64 (05/02 0715) Pulse Rate: 29 (05/02 0700)  Labs: Recent Labs    07/10/20 2109 07/11/20 0316 07/11/20 0820 07/11/20 2351 07/12/20 0140 07/12/20 0433 07/12/20 0932 07/12/20 1132 07/12/20 1610 07/12/20 2316 07/13/20 0317 07/13/20 0318  HGB 16.0 13.4   < > 13.6 12.3*   < >  --    < > 11.8* 11.2*  --  11.8*  HCT 46.1 38.9*   < > 40.1 35.0*   < >  --    < > 33.7* 32.2*  --  34.7*  PLT 128* 105*  --   --  113*  --   --   --   --   --   --  119*  APTT <24*  --   --   --   --   --  26  --   --   --   --   --   LABPROT 13.7  --   --   --   --   --  13.8  --   --   --   --   --   INR 1.0  --   --   --   --   --  1.1  --   --   --   --   --   CREATININE 3.35* 3.11*   < > 2.89* 2.85*  --   --   --   --   --  2.66*  --    < > = values in this interval not displayed.    Estimated Creatinine Clearance: 27.6 mL/min (A) (by C-G formula based on SCr of 2.66 mg/dL (H)).   Medical History: Past Medical History:  Diagnosis Date  . Atrial fibrillation ([place])   . BPH (benign prostatic hyperplasia)   . Cancer ([place])    liver  . Cirrhosis ([place])   . Diabetes mellitus without complication ([place])   . GERD (gastroesophageal reflux disease)   . Hepatitis C     Assessment: 70 y/o M with medical history including cirrhosis / liver cancer s/p liver transplant in 2018, pulmonary arterial hypertension, diabetes, Afib, BPH, substance abuse who is admitted with acute hypoxic respiratory failure secondary to acute encephalopathy / possible CVA and hyperglycemic emergency / AKI. Pt not on anticoagulation prior to admission. reports of coffee ground emesis on 4/29 and has since  resolved. Pharmacy has been consulted for heparin dosing and monitoring for afib. Hgb 12.1, Hct 35.2; aPTT/PT/INR wnl.   Goal of Therapy:  Heparin level 0.3-0.7 units/ml Monitor platelets by anticoagulation protocol: Yes   Plan:  Give 4100 units bolus x 1 Start heparin infusion at 1200 units/hr Check anti-Xa level in 8 hours and daily while on heparin Continue to monitor H&H and platelets  Sherilyn Banker, PharmD Pharmacy Resident  07/13/2020 8:34 AM

## 2020-07-13 NOTE — Progress Notes (Incomplete)
*  PRELIMINARY RESULTS* Echocardiogram 2D Echocardiogram has been performed.  Hector Mueller 07/13/2020, 1:18 PM

## 2020-07-13 NOTE — Progress Notes (Signed)
Initial Nutrition Assessment  DOCUMENTATION CODES:   Not applicable  INTERVENTION:   Vital 1.5 @55ml /hr + ProSource TF 67ml QID via tube   Free water flushes 65ml q4 hours to maintain tube patency   Regimen provides 2140kcal/day, 133g/day protein and 1162ml/day free water  NUTRITION DIAGNOSIS:   Inadequate oral intake related to inability to eat (pt sedated and ventilated) as evidenced by NPO status.  GOAL:   Provide needs based on ASPEN/SCCM guidelines  MONITOR:   Vent status,Labs,Weight trends,Skin,I & O's  REASON FOR ASSESSMENT:   Consult Enteral/tube feeding initiation and management  ASSESSMENT:   70 y/o male with h/o liver cirrhosis due to hepatitis C status post liver transplant in 2018, DM, BPH, GERD, AFib and CKD who is admitted with AKI and possible CVA  Pt sedated and ventilated. OGT in place. Pt tolerating tube feeds well; protocol started over the weekend. Plan is for CT head today. Per chart, pt down 7lbs(4%) over the past month; this is not significant.   Medications reviewed and include: colace, insulin, protonix, miralax, precedex, heparin, phenylephrine   Labs reviewed: Na 133(L), BUN 42(H), creat 2.66(H), P 3.6 wnl, Mg 1.9 wnl cbgs- 272, 212, 179 x 24 hrs  Patient is currently intubated on ventilator support MV: 7.8 L/min Temp (24hrs), Avg:99.1 F (37.3 C), Min:98.1 F (36.7 C), Max:101.2 F (38.4 C)  Propofol: none   MAP- >58mmHg   UOP- 382ml   NUTRITION - FOCUSED PHYSICAL EXAM:  Flowsheet Row Most Recent Value  Orbital Region No depletion  Upper Arm Region No depletion  Thoracic and Lumbar Region No depletion  Buccal Region No depletion  Temple Region Moderate depletion  Clavicle Bone Region Moderate depletion  Clavicle and Acromion Bone Region Moderate depletion  Scapular Bone Region No depletion  Dorsal Hand No depletion  Patellar Region Moderate depletion  Anterior Thigh Region No depletion  Posterior Calf Region No  depletion  Edema (RD Assessment) None  Hair Reviewed  Eyes Reviewed  Mouth Reviewed  Skin Reviewed  Nails Reviewed     Diet Order:   Diet Order            Diet NPO time specified  Diet effective now                EDUCATION NEEDS:   No education needs have been identified at this time  Skin:  Skin Assessment: Reviewed RN Assessment (ecchymosis)  Last BM:  pta  Height:   Ht Readings from Last 1 Encounters:  07/11/20 6' (1.829 m)    Weight:   Wt Readings from Last 1 Encounters:  07/13/20 81.9 kg    Ideal Body Weight:  83.6 kg  BMI:  Body mass index is 24.49 kg/m.  Estimated Nutritional Needs:   Kcal:  1990kcal/day  Protein:  >125-140g/day  Fluid:  2.1-2.4L/day  Koleen Distance MS, RD, LDN Please refer to [facility] for RD and/or RD on-call/weekend/after hours pager

## 2020-07-13 NOTE — Progress Notes (Signed)
NAME:  Hector Mueller, MRN:  [id number], DOB:  03/25/1950, LOS: 2 ADMISSION DATE:  07/10/2020, CONSULTATION DATE: 07/10/2020 REFERRING MD: Dr. Joni Fears, CHIEF COMPLAINT: Altered mental status  History of Present Illness:  70 year old male who was brought to the [facility] ED due to altered mental status.  Per ED documentation the patient was reportedly in his usual state of health last night on 07/09/20 until about 11:30 PM.  This morning the patient was noted to have difficulty walking, but was talking and interacting normally with family.  However today at noon he was found to be unable to stand or move purposefully as well as being unable to speak.   Interim History / Subjective:  Patient remains intubated and encephalopathic Went into A. fib with RVR last night requiring multiple doses of metoprolol IV, then he was started on IV diltiazem that led to hypotension and he was started on phenylephrine Heart rate is better controlled now  Objective   Blood pressure (!) 90/52, pulse (!) 31, temperature 98.7 F (37.1 C), temperature source Axillary, resp. rate 16, height 6' (1.829 m), weight 81.9 kg, SpO2 98 %.    Vent Mode: PRVC FiO2 (%):  [30 %-60 %] 30 % Set Rate:  [16 bmp] 16 bmp Vt Set:  [500 mL] 500 mL PEEP:  [5 cmH20] 5 cmH20 Plateau Pressure:  [13 cmH20-19 cmH20] 14 cmH20   Intake/Output Summary (Last 24 hours) at 07/13/2020 0941 Last data filed at 07/13/2020 7341 Gross per 24 hour  Intake 2418.09 ml  Output 775 ml  Net 1643.09 ml   Filed Weights   07/11/20 0500 07/12/20 0500 07/13/20 0500  Weight: 70 kg 75.8 kg 81.9 kg    Examination: General: Adult male, critically ill, lying in bed, orally intubated HEENT: MM pink/moist, anicteric, atraumatic, neck supple Neuro: Eyes closed, does not open, not following commands CV: Irregularly irregular, no murmur Pulm: Clear to auscultation bilaterally, no wheezes or rhonchi GI: soft, rounded, non tender, bs x 4 GU: foley in place with clear  yellow urine Skin: no rashes/lesions noted Extremities: warm/dry, pulses + 2 R/P, trace edema noted BLE  Imaging    IMPRESSION: 1. Negative CTA for emergent large vessel occlusion. 2. Negative CT perfusion with no evidence for acute ischemia or other perfusion abnormality. 3. Severe distal right P3 stenoses. 4. Otherwise wide patency of the major arterial vasculature of the head and neck. No other hemodynamically significant or correctable stenosis. 5. Question diffuse circumferential esophageal wall thickening, which could reflect changes of acute esophagitis and/or reflux disease.   Assessment & Plan:  Acute Hypoxic Respiratory Failure secondary to acute encephalopathy in the setting of HHS and possible CVA Acute encephalopathy, multifactorial. metabolic/ suspected CVA Poorly controlled diabetes type 2 presented with HHS Continue lung protective ventilation, FiO2 was weaned down to 40% with PEEP of 5 Initial head CT and CTA head and neck was negative for acute stroke/LVO Unable to get MRI due to pacemaker placement Will repeat head CT today Minimize sedation with RASS goal 0/-1 Monitor electrolytes and blood sugar Patient CBGs are not well controlled, increase Lantus to 15 units once daily Continue with sliding scale insulin Chest x-ray did not show any infiltrate, stop Unasyn Obtain ammonia level  Paroxysmal A. fib with rapid ventricular response Last night patient went to A. fib with RVR, requiring multiple doses of IV metoprolol followed by IV diltiazem infusion Cardiology has been consulted, appreciate their recommendation Continue IV heparin infusion for stroke prophylaxis Stop IV diltiazem infusion, started on  low-dose metoprolol, titrate up as tolerated Obtain echocardiogram Titrate phenylephrine  Acute Kidney Injury on CKD Baseline Cr: 1.6, Cr on admission: 3.35, likely prerenal Serum creatinine is improving now it is 2.6 Stop IV fluid Monitor BMP and  electrolytes Avoid nephrotoxic agents as able, ensure adequate renal perfusion  Coffee-ground emesis Thrombocytopenia  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 Minimal coffee-ground emesis from OG tube, patient hemodynamically stable H&H remained stable Patient is on cyclosporine and mycophenolate at home, will resume Checks cyclosporine level  Monitor for signs and symptoms of bleeding Transfuse if hemoglobin less than 7  Pulmonary arterial hypertension Who class I Hold sildenafil Continuous cardiac monitoring  Best practice (right click and "Reselect all SmartList Selections" daily)  Diet:  NPO tube feeds Pain/Anxiety/Delirium protocol (if indicated): Yes (RASS goal -1) VAP protocol (if indicated): Yes DVT prophylaxis: Systemic heparin GI prophylaxis: PPI Glucose control: Basal and bolus insulin Central venous access:  N/A Arterial line:  N/A Foley:  Yes, and it is still needed due to urinary retention Mobility:  bed rest  PT consulted: N/A Last date of multidisciplinary goals of care discussion 07/10/20 Code Status:  full code Disposition: ICU  Labs   CBC: Recent Labs  Lab 07/10/20 2109 07/11/20 0316 07/11/20 1228 07/12/20 0140 07/12/20 0433 07/12/20 1132 07/12/20 1610 07/12/20 2316 07/13/20 0318  WBC 13.6* 9.3  --  11.9*  --   --   --   --  6.6  NEUTROABS 12.3*  --   --   --   --   --   --   --  5.0  HGB 16.0 13.4   < > 12.3* 11.5* 14.2 11.8* 11.2* 11.8*  HCT 46.1 38.9*   < > 35.0* 32.8* 41.6 33.7* 32.2* 34.7*  MCV 87.8 90.5  --  87.1  --   --   --   --  89.4  PLT 128* 105*  --  113*  --   --   --   --  119*   < > = values in this interval not displayed.    Basic Metabolic Panel: Recent Labs  Lab 07/11/20 0316 07/11/20 0820 07/11/20 1228 07/11/20 1700 07/11/20 2351 07/12/20 0140 07/12/20 0932 07/12/20 1610 07/13/20 0317  NA 133*   < > 136 136 136 137  --   --  133*  K 3.7   < > 3.0* 2.9* 3.4* 3.6  --   --  4.1  CL  90*   < > 97* 99 101 99  --   --  99  CO2 19*   < > 25 27 23 26   --   --  22  GLUCOSE 530*   < > 178* 160* 176* 170*  --   --  292*  BUN 49*   < > 48* 50* 47* 46*  --   --  42*  CREATININE 3.11*   < > 3.05* 2.91* 2.89* 2.85*  --   --  2.66*  CALCIUM 9.6   < > 9.2 9.1 9.1 9.0  --   --  8.0*  MG 2.1  --   --   --   --  1.8 2.1 2.1 1.9  PHOS 3.4  --   --   --   --  <1.0* 4.0 2.7 3.6   < > = values in this interval not displayed.   GFR: Estimated Creatinine Clearance: 27.6 mL/min (A) (by C-G formula based on SCr of 2.66 mg/dL (  H)). Recent Labs  Lab 07/10/20 2109 07/10/20 2155 07/10/20 2355 07/11/20 0316 07/12/20 0140 07/13/20 0318  PROCALCITON  --   --   --  0.24 0.57 2.95  WBC 13.6*  --   --  9.3 11.9* 6.6  LATICACIDVEN  --  3.2* 3.8*  --   --   --     Liver Function Tests: Recent Labs  Lab 07/10/20 2109  AST 14*  ALT 11  ALKPHOS 88  BILITOT 1.7*  PROT 7.9  ALBUMIN 4.7   No results for input(s): LIPASE, AMYLASE in the last 168 hours. Recent Labs  Lab 07/11/20 0316 07/13/20 0842  AMMONIA 23 <9*    ABG    Component Value Date/Time   PHART 7.36 07/11/2020 0013   PCO2ART 36 07/11/2020 0013   PO2ART 58 (L) 07/11/2020 0013   HCO3 20.3 07/11/2020 0013   ACIDBASEDEF 4.5 (H) 07/11/2020 0013   O2SAT 88.5 07/11/2020 0013     Coagulation Profile: Recent Labs  Lab 07/10/20 2109 07/12/20 0932  INR 1.0 1.1    Cardiac Enzymes: No results for input(s): CKTOTAL, CKMB, CKMBINDEX, TROPONINI in the last 168 hours.  HbA1C: Hgb A1c MFr Bld  Date/Time Value Ref Range Status  07/11/2020 03:16 AM 10.8 (H) 4.8 - 5.6 % Final    Comment:    (NOTE) Pre diabetes:          5.7%-6.4%  Diabetes:              >6.4%  Glycemic control for   <7.0% adults with diabetes     CBG: Recent Labs  Lab 07/12/20 1934 07/12/20 2021 07/12/20 2327 07/13/20 0331 07/13/20 0709  GLUCAP 187* 188* 220* 272* 212*    Critical care time:     Total critical care time: 44  minutes  Performed by: [place] care time was exclusive of separately billable procedures and treating other patients.   Critical care was necessary to treat or prevent imminent or life-threatening deterioration.   Critical care was time spent personally by me on the following activities: development of treatment plan with patient and/or surrogate as well as nursing, discussions with consultants, evaluation of patient's response to treatment, examination of patient, obtaining history from patient or surrogate, ordering and performing treatments and interventions, ordering and review of laboratory studies, ordering and review of radiographic studies, pulse oximetry and re-evaluation of patient's condition.   Jacky Kindle MD [facility] See Amion for pager If no response to pager, please call [phone] until 7pm After 7pm, Please call E-link [phone]

## 2020-07-13 NOTE — Progress Notes (Incomplete)
Patient transitioned from NSR to Afib with RVR at 0130. Was given dose of metoprolol iv. Pulse remained the same and was given a bolus of cardizem and a drip started.

## 2020-07-13 NOTE — Progress Notes (Signed)
Paroxysmal Atrial Fibrillation with Rapid Ventricular Response PMHx: A-fib  Not on rte control/anticoagulation outpatient, per chart review he was on amiodarone PO daily but that Rx ended 04/13/20. Last cardiology office visit he was taken off his antiarrhythmic as he had remained in NSR and monitor for recurrence. With the plan that if he did develop A-Fib again then they would restart anticoagulation at that time.  CHA2DS2-VASc score: 2-4 (suspected CVA on admission that has not been confirmed with imaging) - metoprolol IVP given with limited impact - Cardizem load & infusion - STAT AM labs, correct electrolyte abnormalities - Continuous cardiac monitoring - unable to anticoagulate d/t coffee ground emesis on admission and down-trending hemoglobin.(VTE prophylaxis on hold) - cardiology consulted, appreciate input   Patient still with high HR in the 140's-150's after an hour on the diltiazem drip. Hesitant to add Amiodarone and accidentally cardiovert when the patient has not been anticoagulated or receiving an antiarrythmic outpatient & admitted due to possible ischemic CVA.  Discussed the case with E-link provider, Dr. Jeannene Patella. Electrolyte levels pending, will replace PRN. Will also exchange levophed for neo-synephrine. If the patient continues to be refractory, with HR > 135 sustained after all these changes, will add Amiodarone at that time.   Domingo Pulse [place], AGACNP-BC Acute Care Nurse Practitioner [facility]   [phone] / [phone] Please see Amion for pager details.

## 2020-07-13 NOTE — Consult Note (Signed)
PHARMACY CONSULT NOTE  Pharmacy Consult for Electrolyte Monitoring and Replacement   Recent Labs: Potassium (mmol/L)  Date Value  07/13/2020 4.1   Magnesium (mg/dL)  Date Value  07/13/2020 1.9   Calcium (mg/dL)  Date Value  07/13/2020 8.0 (L)   Albumin (g/dL)  Date Value  07/10/2020 4.7   Phosphorus (mg/dL)  Date Value  07/13/2020 3.6   Sodium (mmol/L)  Date Value  07/13/2020 133 (L)   Assessment: Patient is a 70 y/o M with medical history including cirrhosis / liver cancer s/p liver transplant in 2018, pulmonary arterial hypertension, diabetes, Afib, BPH, substance abuse who is admitted with acute hypoxic respiratory failure secondary to acute encephalopathy / possible CVA and hyperglycemic emergency / AKI. Pharmacy has been consulted for electrolyte management.   Patient is currently intubated, sedated, and on mechanical ventilation in the ICU. 30% FiO2. Renal dysfunction persistent despite fluid resuscitation. Requiring Levophed for blood pressure support. Patient transitioned off of IV insulin to SQ insulin.  PROSource 2mL BID Vital High Protein @40mL /hr  Goal of Therapy:  K 4-5.1 mmol/L Mg 2-2.4 mg/dL All other electrolytes within normal limits  Plan:  --Na 133 - pt on tube feeds; will hold off on replacement for now  --Mg 1.9 - replaced by NP  --Follow-up electrolytes with AM labs   Sherilyn Banker, PharmD Pharmacy Resident  07/13/2020 10:54 AM

## 2020-07-13 NOTE — Progress Notes (Signed)
[facility]  Date: 07/13/20  Time: 11:57 AM  I was contacted by the patient's RN regarding tele and EKG changes.  EKG shows atrial flutter with 2:1 AV block and 1 mm inferior ST elevation, new from prior tracing this AM.  Patient remains on phenylephrine for BP support.  Exam otherwise unchanged from this AM.  Clinical status was discussed at length with his wife, who is at the bedside.  We discussed emergent DCCV and cardiac catheterization with possible, both of which carry significant risk in the patient's current clinical state and with his comorbidities including AMS with question of recent stroke, shock, and acute kidney injury.  Risks and benefits of both procedures were discussed at length and we have agreed to proceed with DCCV.  If evidence of inferior STEMI persists after restoration of sinus rhythm, we will proceed with emergent LHC/PCI.  Patient remains intubated and unresponsive.  Shared Decision Making/Informed Consent The risks [stroke (1 in 1000), death (1 in 1000), kidney failure [usually temporary] (1 in 500), bleeding (1 in 200), allergic reaction [possibly serious] (1 in 200)], benefits (diagnostic support and management of coronary artery disease) and alternatives of a cardiac catheterization were discussed in detail with Mr. Cantara wife and his she is willing to proceed.   The risks (stroke, cardiac arrhythmias rarely resulting in the need for a temporary or permanent pacemaker, skin irritation or burns and complications associated with conscious sedation including aspiration, arrhythmia, respiratory failure and death), benefits (restoration of normal sinus rhythm) and alternatives of a direct current cardioversion were explained in detail to Mr. Knotts wife and she agrees to proceed.     Nelva Bush, MD [facility]

## 2020-07-13 NOTE — Progress Notes (Signed)
Patient cardioverted at bedside by Dr. Saunders Revel.

## 2020-07-13 NOTE — Consult Note (Signed)
Cardiology Consultation:   Patient ID: Hector Mueller MRN: [id number]; DOB: May 12, 1950  Admit date: 07/10/2020 Date of Consult: 07/13/2020  PCP:  [name], MD   [facility]  Cardiologist:  [facility] Electrophysiologist:  [facility]  Patient Profile:   Hector Mueller is a 70 y.o. male with a hx of sick sinus syndrome s/p Micra pacemaker (09/2016), persistent atrial fibrillation previously on amiodarone and warfarin but stopped by EP team at [facility] in 2019 in setting of no recurrent a-fib and CHADSVASC score of 1, pulmonary hypertension, cirrhosis s/p liver transplant, type 2 diabetes mellitus, and hyperlipidemia, who is being seen today for the evaluation of atrial fibrillation with rapid ventricular response at the request of Dr. Tacy Learn.  History of Present Illness:   Hector Mueller was admitted to [facility]scopy on 07/10/2020 with altered mental status and required intubation for airway protection.  He remains intubated; history is obtained from the chart.  On the night of 07/09/2020, he reportedly had difficulty walking but was able to converse and interact with his family in a normal fashion.  However, the next day around noon he could not stand, speak, or make purposeful movements.  He was transported to the ED with concern for stroke.  Head CT did not show any acute intracranial abnormalities.  CTA head/neck did not show evidence of large vessel occlusion, CT perfusion study was normal.  Following intubation in the ED, there was concern for coffee-ground gastric material.  This had resolved yesterday.  He was noted to be quite hyperglycemic on arrival with concern for HHS contributing to his altered mental status, as well as potential stroke.  Labs were also notable for AKI with creatinine of 3.4 on admission (baseline ~1.6)  Early this morning, the patient was noted to develop atrial fibrillation with rapid ventricular response.  He was placed on a diltiazem infusion complicated by  hypotension that has led to addition of phenylephrine infusion.  He is not currently on anticoagulation.   Past Medical History:  Diagnosis Date  . Atrial fibrillation ([place])   . BPH (benign prostatic hyperplasia)   . Cancer ([place])    liver  . Cirrhosis ([place])   . Diabetes mellitus without complication ([place])   . GERD (gastroesophageal reflux disease)   . Hepatitis C     Past Surgical History:  Procedure Laterality Date  . ANKLE FRACTURE SURGERY Bilateral   . BACK SURGERY    . CLAVICLE SURGERY    . INTRAMEDULLARY (IM) NAIL INTERTROCHANTERIC Right 11/28/2017   Procedure: INTRAMEDULLARY (IM) NAIL INTERTROCHANTRIC;  Surgeon: Earnestine Leys, MD;  Location: [facility] ORS;  Service: Orthopedics;  Laterality: Right;  . LIVER SURGERY       Home Medications:  Prior to Admission medications   Medication Sig Start Date  Date Taking? Authorizing Provider  alendronate (FOSAMAX) 70 MG tablet Take 70 mg by mouth once a week. Take with a full glass of water on an empty stomach.   Yes [provider]  chlorthalidone (HYGROTON) 25 MG tablet Take 12.5 mg by mouth every morning. 05/11/20  Yes [provider]  Cholecalciferol (VITAMIN D3) 125 MCG (5000 UT) CAPS Take 5,000 Units by mouth daily.   Yes [provider]  cycloSPORINE modified (NEORAL) 100 MG capsule Take 200 mg by mouth 2 (two) times daily. Take along with 3 x 25 mg cap twice a day for total of 217m twice daily   Yes [provider]  cycloSPORINE modified (NEORAL) 25 MG capsule Take 75  mg by mouth 2 (two) times daily. Take with 2 x 100 mg cap twice a day for total of 223m twice daily   Yes [provider]  DULoxetine (CYMBALTA) 30 MG capsule Take 30 mg by mouth daily. 05/22/20  Yes [provider]  Insulin Glargine (BASAGLAR KWIKPEN) 100 UNIT/ML Inject 12 Units into the skin daily as needed.   Yes [provider]  Melatonin 3 MG TABS Take by mouth. 02/06/17  Yes [provider]  mirtazapine (REMERON) 15 MG tablet Take 15 mg by mouth at bedtime.  10/18/17  Yes [provider]  mycophenolate (CELLCEPT) 250 MG capsule Take 500 mg by mouth in the morning and at bedtime. 04/14/20 04/14/21 Yes [provider]  omeprazole (PRILOSEC) 20 MG capsule Take 20 mg by mouth daily.   Yes [provider]  OZEMPIC, 0.25 OR 0.5 MG/DOSE, 2 MG/1.5ML SOPN Inject 0.5 mg into the skin once a week. 05/04/20  Yes [provider]  OZEMPIC, 1 MG/DOSE, 4 MG/3ML SOPN Inject 1 mg into the skin once a week. 05/19/20  Yes [provider]  rosuvastatin (CRESTOR) 5 MG tablet Take 5 mg by mouth daily. 06/09/20  Yes [provider]  sildenafil (REVATIO) 20 MG tablet Take 40 mg by mouth 3 (three) times daily.  10/17/17  Yes [provider]  sodium bicarbonate 650 MG tablet Take 650 mg by mouth in the morning and at bedtime. 03/18/20 03/18/21 Yes [provider]  Specialty Vitamins Products (MAGNESIUM, AMINO ACID CHELATE,) 133 MG tablet Take 2 tablets by mouth 2 (two) times daily. 03/02/17  Yes [provider]  Tacrine Hydrochloride (COGNEX PO) Take 2 capsules by mouth daily at 6 (six) AM. With a glass of water   Yes [provider]  aspirin 81 MG chewable tablet Chew 1 tablet (81 mg total) by mouth daily. Patient not taking: No sig reported 12/02/17   VVaughan Basta MD  bisacodyl (DULCOLAX) 5 MG EC tablet Take 1 tablet (5 mg total) by mouth daily as needed for moderate constipation. Patient not taking: No sig reported 12/01/17   VVaughan Basta MD  calcium carbonate (TUMS - DOSED IN MG ELEMENTAL CALCIUM) 500 MG chewable tablet Chew 2 tablets (400 mg of elemental calcium total) by mouth daily as needed for indigestion or heartburn. Patient not taking: No sig reported 12/01/17   VVaughan Basta MD  docusate sodium (COLACE) 100 MG capsule Take 1 capsule (100 mg total) by mouth 2 (two) times daily. Patient not  taking: No sig reported 12/01/17   VVaughan Basta MD  ferrous sulfate 325 (65 FE) MG tablet Take 1 tablet (325 mg total) by mouth 2 (two) times daily with a meal. Patient not taking: No sig reported 12/01/17   VVaughan Basta MD  HYDROcodone-acetaminophen (NORCO/VICODIN) 5-325 MG tablet Take 1 tablet by mouth every 4 (four) hours as needed for moderate pain or severe pain (pain score 4-6). Patient not taking: No sig reported 12/01/17   VVaughan Basta MD  Multiple Vitamin (MULTIVITAMIN) tablet Take 1 tablet by mouth daily. Patient not taking: Reported on 07/11/2020    [provider]  ondansetron (ZOFRAN) 4 MG tablet Take 1 tablet (4 mg total) by mouth every 6 (six) hours as needed for nausea. Patient not taking: No sig reported 12/01/17   VVaughan Basta MD  senna (SENOKOT) 8.6 MG TABS tablet Take 1 tablet (8.6 mg total) by mouth 2 (two) times daily. Patient not taking: No sig reported 12/01/17  Vaughan Basta, MD  spironolactone (ALDACTONE) 50 MG tablet Take 50 mg by mouth daily. Patient not taking: Reported on 07/11/2020 01/07/14   [provider]    Inpatient Medications: Scheduled Meds: . chlorhexidine gluconate (MEDLINE KIT)  15 mL Mouth Rinse BID  . Chlorhexidine Gluconate Cloth  6 each Topical QHS  . cycloSPORINE  200 mg Per Tube Daily   And  . cycloSPORINE  175 mg Per Tube QHS  . docusate  100 mg Per Tube BID  . feeding supplement (PROSource TF)  45 mL Per Tube BID  . feeding supplement (VITAL HIGH PROTEIN)  1,000 mL Per Tube Q24H  . insulin aspart  0-9 Units Subcutaneous Q4H  . insulin glargine  10 Units Subcutaneous QHS  . mouth rinse  15 mL Mouth Rinse 10 times per day  . pantoprazole (PROTONIX) IV  40 mg Intravenous Q12H  . polyethylene glycol  17 g Per Tube Daily  . sildenafil  40 mg Per Tube TID   Continuous Infusions: . sodium chloride 10 mL/hr at 07/13/20 0145  . dexmedetomidine (PRECEDEX) IV infusion 1.2  mcg/kg/hr (07/13/20 0811)  . diltiazem (CARDIZEM) infusion 15 mg/hr (07/13/20 0804)  . fentaNYL infusion INTRAVENOUS 50 mcg/hr (07/13/20 0731)  . lactated ringers Stopped (07/12/20 1023)  . phenylephrine (NEO-SYNEPHRINE) Adult infusion 80 mcg/min (07/13/20 0730)   PRN Meds: albuterol, dextrose, docusate sodium, fentaNYL (SUBLIMAZE) injection, polyethylene glycol  Allergies:   No Known Allergies  Social History:   Unable to obtain - intubated and sedated.   Family History:   Unable to obtain - intubated and sedated.  ROS:  Unable to obtain - intubated and sedated.  Physical Exam/Data:   Vitals:   07/13/20 0400 07/13/20 0500 07/13/20 0700 07/13/20 0715  BP: 116/71 90/60 110/60 111/64  Pulse:  (!) 44 (!) 29   Resp: _0 Temp: 98.6 F (37 C)   98.7 F (37.1 C)  TempSrc: Axillary   Axillary  SpO2:  98% 100%   Weight:  81.9 kg    Height:        Intake/Output Summary (Last 24 hours) at 07/13/2020 0817 Last data filed at 07/13/2020 0730 Gross per 24 hour  Intake 2129.21 ml  Output 550 ml  Net 1579.21 ml   Last 3 Weights 07/13/2020 07/12/2020 07/11/2020  Weight (lbs) 180 lb 8.9 oz 167 lb 1.7 oz 154 lb 5.2 oz  Weight (kg) 81.9 kg 75.8 kg 70 kg     Body mass index is 24.49 kg/m.  General:  Intubated and sedated.  Does not respond to voice or tactile stimulation. HEENT: ETT in place. Lymph: no adenopathy Neck: Unable to assess JVP due to support devices, facial hair, and positioning. Endocrine:  No thryomegaly Vascular: 1+ radial pulses. Cardiac:  Tachycardic and irregularly irregular. Lungs:  Clear anteriorly. Abd: soft, nontender, no hepatomegaly  Ext: no edema Musculoskeletal:  No deformities.  Does not move extremities. Skin: warm and dry  Neuro:  Intubated and sedated. Psych:  Intubated and sedated.  EKG:  The EKG was personally reviewed and demonstrates:  Atrial fibrillation with rapid ventricular response, possible RVH versus lead placement, and nonspecific  ST/T abnormalities. Telemetry:  Telemetry was personally reviewed and demonstrates:  Sinus rhythm until 1:30 AM today, when the patient converted to a-fib with RVR (rates 120-150 bpm).  Relevant CV Studies: TTE (12/10/2019, [facility]): 1. Technically difficult study due to chest wall/lung interference.  2. The left ventricular systolic function is normal, LVEF is visually  estimated  at 55-60%.  3. The aortic valve is probably trileaflet with mildly thickened leaflets  with normal excursion.  4. The right ventricle is normal in size, with normal systolic function.   Laboratory Data:  High Sensitivity Troponin:  No results for input(s): TROPONINIHS in the last 720 hours.   Chemistry Recent Labs  Lab 07/11/20 2351 07/12/20 0140 07/13/20 0317  NA 136 137 133*  K 3.4* 3.6 4.1  CL 101 99 99  CO2 _0 GLUCOSE 176* 170* 292*  BUN 47* 46* 42*  CREATININE 2.89* 2.85* 2.66*  CALCIUM 9.1 9.0 8.0*  GFRNONAA 22* 23* 25*  ANIONGAP _1 Recent Labs  Lab 07/10/20 2109  PROT 7.9  ALBUMIN 4.7  AST 14*  ALT 11  ALKPHOS 88  BILITOT 1.7*   Hematology Recent Labs  Lab 07/11/20 0316 07/11/20 1228 07/12/20 0140 07/12/20 0433 07/12/20 1610 07/12/20 2316 07/13/20 0318  WBC 9.3  --  11.9*  --   --   --  6.6  RBC 4.30  --  4.02*  --   --   --  3.88*  HGB 13.4   < > 12.3*   < > 11.8* 11.2* 11.8*  HCT 38.9*   < > 35.0*   < > 33.7* 32.2* 34.7*  MCV 90.5  --  87.1  --   --   --  89.4  MCH 31.2  --  30.6  --   --   --  30.4  MCHC 34.4  --  35.1  --   --   --  34.0  RDW 12.2  --  12.5  --   --   --  12.8  PLT 105*  --  113*  --   --   --  119*   < > = values in this interval not displayed.   BNPNo results for input(s): BNP, PROBNP in the last 168 hours.  DDimer No results for input(s): DDIMER in the last 168 hours.   Radiology/Studies:  CT ANGIO HEAD NECK W WO CM  Result Date: 07/10/2020 CLINICAL DATA:  Initial evaluation for acute neuro deficit, stroke suspected.  Unresponsive. EXAM: CT ANGIOGRAPHY HEAD AND NECK CT PERFUSION BRAIN TECHNIQUE: Multidetector CT imaging of the head and neck was performed using the standard protocol during bolus administration of intravenous contrast. Multiplanar CT image reconstructions and MIPs were obtained to evaluate the vascular anatomy. Carotid stenosis measurements (when applicable) are obtained utilizing NASCET criteria, using the distal internal carotid diameter as the denominator. Multiphase CT imaging of the brain was performed following IV bolus contrast injection. Subsequent parametric perfusion maps were calculated using RAPID software. CONTRAST:  138m OMNIPAQUE IOHEXOL 350 MG/ML SOLN COMPARISON:  Prior head CT from earlier the same day. FINDINGS: CTA NECK FINDINGS Aortic arch: Visualized aortic arch normal in caliber with normal 3 vessel morphology. No hemodynamically significant stenosis seen about the origin the great vessels. Mild atheromatous plaque within the arch itself. Right carotid system: Right common carotid artery patent from its origin to the bifurcation without stenosis. Mild eccentric plaque at the right carotid bulb without significant stenosis. Right ICA widely patent distally without stenosis, dissection or occlusion. Left carotid system: Left CCA patent from its origin to the bifurcation without stenosis. Minimal atheromatous change about the left carotid bulb without significant stenosis. Left ICA patent distally without stenosis, dissection or occlusion. Vertebral arteries: Both vertebral arteries arise from subclavian arteries. No proximal subclavian artery stenosis. Vertebral arteries widely patent  within the neck without stenosis, dissection or occlusion. Skeleton: No visible acute osseous finding. No discrete or worrisome osseous lesions. Other neck: No other visible soft tissue abnormality within the neck. No mass or adenopathy. 5 mm right thyroid nodule noted, felt to be of doubtful significance given  size and patient age, no follow-up imaging recommended (ref: J Am Coll Radiol. 2015 Feb;12(2): 143-50).Endotracheal and enteric tubes in place. Upper chest: Esophagus is collapsed about the enteric tube. There is question of mild circumferential esophageal wall thickening/edema, which could reflect changes of acute esophagitis and/or reflux disease. Visualized upper chest demonstrates no other acute finding. Review of the MIP images confirms the above findings CTA HEAD FINDINGS Anterior circulation: Mild scattered plaque within the carotid siphons without hemodynamically significant stenosis. A1 segments patent bilaterally. Normal anterior communicating artery complex. Anterior cerebral arteries patent to their distal aspects without stenosis. No M1 stenosis or occlusion. Normal MCA bifurcations. Distal MCA branches well perfused and symmetric. Posterior circulation: Both V4 segments patent to the vertebrobasilar junction without stenosis. Right vertebral artery dominant. Left PICA origin patent and normal. Right PICA not definitely seen. Basilar patent to its distal aspect without stenosis. Superior cerebellar arteries patent bilaterally. Both PCAs primarily supplied via the basilar. Left PCA widely patent to its distal aspect. There or focal severe distal right P3 stenoses (series 9, image 144). Attenuated but grossly patent flow seen distally within the right PCA branches. Venous sinuses: Grossly patent allowing for timing the contrast bolus. Anatomic variants: None significant.  No aneurysm. Review of the MIP images confirms the above findings CT Brain Perfusion Findings: ASPECTS: 10. CBF (<30%) Volume: 39m Perfusion (Tmax>6.0s) volume: 071mMismatch Volume: 61m46mnfarction Location:Negative CT perfusion with no evidence for acute ischemia or other perfusion abnormality. IMPRESSION: 1. Negative CTA for emergent large vessel occlusion. 2. Negative CT perfusion with no evidence for acute ischemia or other perfusion  abnormality. 3. Severe distal right P3 stenoses. 4. Otherwise wide patency of the major arterial vasculature of the head and neck. No other hemodynamically significant or correctable stenosis. 5. Question diffuse circumferential esophageal wall thickening, which could reflect changes of acute esophagitis and/or reflux disease. Electronically Signed   By: BenJeannine BogaD.   On: 07/10/2020 23:56   DG Chest 1 View  Result Date: 07/12/2020 CLINICAL DATA:  Central line placement. EXAM: CHEST  1 VIEW COMPARISON:  07/10/2020 FINDINGS: 0912 hours. Endotracheal tube tip is 3.9 cm above the base of the carina. NG tube tip is in the stomach. Mild asymmetric elevation left hemidiaphragm with left base atelectasis. Right IJ central line is new in the interval with tip overlying the mid to distal SVC. No pneumothorax. No pleural effusion. IMPRESSION: New right IJ central line tip overlies the mid to distal SVC. No pneumothorax. New atelectasis left base with asymmetric elevation left hemidiaphragm. Electronically Signed   By: EriMisty StanleyD.   On: 07/12/2020 09:24   CT CEREBRAL PERFUSION W CONTRAST  Result Date: 07/10/2020 CLINICAL DATA:  Initial evaluation for acute neuro deficit, stroke suspected. Unresponsive. EXAM: CT ANGIOGRAPHY HEAD AND NECK CT PERFUSION BRAIN TECHNIQUE: Multidetector CT imaging of the head and neck was performed using the standard protocol during bolus administration of intravenous contrast. Multiplanar CT image reconstructions and MIPs were obtained to evaluate the vascular anatomy. Carotid stenosis measurements (when applicable) are obtained utilizing NASCET criteria, using the distal internal carotid diameter as the denominator. Multiphase CT imaging of the brain was performed following IV bolus contrast injection. Subsequent parametric perfusion maps  were calculated using RAPID software. CONTRAST:  157m OMNIPAQUE IOHEXOL 350 MG/ML SOLN COMPARISON:  Prior head CT from earlier the  same day. FINDINGS: CTA NECK FINDINGS Aortic arch: Visualized aortic arch normal in caliber with normal 3 vessel morphology. No hemodynamically significant stenosis seen about the origin the great vessels. Mild atheromatous plaque within the arch itself. Right carotid system: Right common carotid artery patent from its origin to the bifurcation without stenosis. Mild eccentric plaque at the right carotid bulb without significant stenosis. Right ICA widely patent distally without stenosis, dissection or occlusion. Left carotid system: Left CCA patent from its origin to the bifurcation without stenosis. Minimal atheromatous change about the left carotid bulb without significant stenosis. Left ICA patent distally without stenosis, dissection or occlusion. Vertebral arteries: Both vertebral arteries arise from subclavian arteries. No proximal subclavian artery stenosis. Vertebral arteries widely patent within the neck without stenosis, dissection or occlusion. Skeleton: No visible acute osseous finding. No discrete or worrisome osseous lesions. Other neck: No other visible soft tissue abnormality within the neck. No mass or adenopathy. 5 mm right thyroid nodule noted, felt to be of doubtful significance given size and patient age, no follow-up imaging recommended (ref: J Am Coll Radiol. 2015 Feb;12(2): 143-50).Endotracheal and enteric tubes in place. Upper chest: Esophagus is collapsed about the enteric tube. There is question of mild circumferential esophageal wall thickening/edema, which could reflect changes of acute esophagitis and/or reflux disease. Visualized upper chest demonstrates no other acute finding. Review of the MIP images confirms the above findings CTA HEAD FINDINGS Anterior circulation: Mild scattered plaque within the carotid siphons without hemodynamically significant stenosis. A1 segments patent bilaterally. Normal anterior communicating artery complex. Anterior cerebral arteries patent to their  distal aspects without stenosis. No M1 stenosis or occlusion. Normal MCA bifurcations. Distal MCA branches well perfused and symmetric. Posterior circulation: Both V4 segments patent to the vertebrobasilar junction without stenosis. Right vertebral artery dominant. Left PICA origin patent and normal. Right PICA not definitely seen. Basilar patent to its distal aspect without stenosis. Superior cerebellar arteries patent bilaterally. Both PCAs primarily supplied via the basilar. Left PCA widely patent to its distal aspect. There or focal severe distal right P3 stenoses (series 9, image 144). Attenuated but grossly patent flow seen distally within the right PCA branches. Venous sinuses: Grossly patent allowing for timing the contrast bolus. Anatomic variants: None significant.  No aneurysm. Review of the MIP images confirms the above findings CT Brain Perfusion Findings: ASPECTS: 10. CBF (<30%) Volume: 089mPerfusion (Tmax>6.0s) volume: 41m22mismatch Volume: 41mL57mfarction Location:Negative CT perfusion with no evidence for acute ischemia or other perfusion abnormality. IMPRESSION: 1. Negative CTA for emergent large vessel occlusion. 2. Negative CT perfusion with no evidence for acute ischemia or other perfusion abnormality. 3. Severe distal right P3 stenoses. 4. Otherwise wide patency of the major arterial vasculature of the head and neck. No other hemodynamically significant or correctable stenosis. 5. Question diffuse circumferential esophageal wall thickening, which could reflect changes of acute esophagitis and/or reflux disease. Electronically Signed   By: BenjJeannine Boga.   On: 07/10/2020 23:56   DG Chest Portable 1 View  Result Date: 07/10/2020 CLINICAL DATA:  Status post intubation. Endotracheal tube and orogastric tube. EXAM: PORTABLE CHEST 1 VIEW COMPARISON:  Chest x-ray 11/27/2017 FINDINGS: Endotracheal tube with tip terminating 3.5cm above the carina. Enteric tube coursing below the  hemidiaphragm with tip and side port overlying the expected region of the gastric lumen. The heart size and mediastinal contours are unchanged.  Aortic arch calcification. Or list device overlies the mediastinum. No focal consolidation. No pulmonary edema. No pleural effusion. No pneumothorax. No acute osseous abnormality. Multilevel degenerative changes of the spine. Partially visualized thoracolumbar surgical hardware. Degenerative changes of the shoulders. IMPRESSION: 1. No active disease. 2. Endotracheal tube and enteric tube in good position. Electronically Signed   By: Iven Finn M.D.   On: 07/10/2020 23:36   CT HEAD CODE STROKE WO CONTRAST  Result Date: 07/10/2020 CLINICAL DATA:  Code stroke. Initial evaluation for acute unresponsiveness. EXAM: CT HEAD WITHOUT CONTRAST TECHNIQUE: Contiguous axial images were obtained from the base of the skull through the vertex without intravenous contrast. COMPARISON:  Prior CT from 11/27/2017. 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 No acute intracranial hemorrhage. No acute large vessel territory infarct. No mass lesion, midline shift or mass effect. No hydrocephalus or extra-axial fluid collection. Vascular: No hyperdense vessel. Scattered vascular calcifications noted within the carotid siphons. Skull: Scalp soft tissues and calvarium within normal limits. Sinuses/Orbits: Globes orbital soft tissues demonstrate no acute finding. Mild mucosal thickening within the ethmoidal air cells. Paranasal sinuses are otherwise clear. No mastoid effusion. Other: None. ASPECTS [facility] Early CT Score) - Ganglionic level infarction (caudate, lentiform nuclei, internal capsule, insula, M1-M3 cortex): 7 - Supraganglionic infarction (M4-M6 cortex): 3 Total score (0-10 with 10 being normal): 10 IMPRESSION: 1. No acute  intracranial abnormality. 2. ASPECTS is 10. 3. Age-related cerebral atrophy with chronic microvascular ischemic disease. Critical Value/emergent results were called by telephone at the time of interpretation on 07/10/2020 at 9:30 pm to provider PHILLIP STAFFORD , who verbally acknowledged these results. Electronically Signed   By: Jeannine Boga M.D.   On: 07/10/2020 21:33     Assessment and Plan:   Atrial fibrillation with rapid ventricular response: Patient with h/o a-fib following liver transplant and at one point was on amio and warfarin (discontinued around 2019 in the setting of no further a-fib and CHADSVASC score of 1 at that time).  Currently, he is in a-fib with poorly controlled ventricular rates on IV diltiazem and phenylephrine.  His CHADSVASC score is at least 2 (age and DM), though with concern for CVA leading to AMS, his CHADVASC score may be higher.  There has been concern for coffee-ground emesis, though this seems to have resolved with stabilizing hemoglobin.  Start IV heparin.  Discontinue IV diltiazem and start metoprolol tartrate 12.5 mg per OG tube every 6 hours (escalate as tolerated).  Wean phenylephrine.  Given concern for CVA, would avoid amiodarone unless no other option exists due to risk of pharmacologic cardioversion.  Obtain echocardiogram.  Not a good candidate for digoxin in the setting of AKI.  Possible CVA: Patient may have had PAF leading up to admission.  Initial brain imaging without acute abnormality.  Consider brain MRI if radiology is able to accommodate Micra pacemaker and device interrogation permits.  Interrogate PPM to assess for PAF and MRI compatibility.  Initiate IV heparin, as above.  Neuro assessment when sedation weaned.  DM:  Per CCM.  H/o cirrhosis s/p liver transplant:  Per CCM.  AKI: Patient does not appear significant volume overloaded.  Avoid nephrotoxic drugs.  Consider transducing CVP to assess volume  status.  Favor gentle hydration.  Hypotension: Likely multifactorial, including medication and a-fib with RVR.  Transition diltiazem gtt to enteric metoprolol.  Hold sildenafil.  Wean phenylephrine, as tolerated.  For  questions or updates, please contact [place] Please consult [email or web address] for contact info under [facility].  CRITICAL CARE Performed by: Nelva Bush, MD  Total critical care time: 45 minutes  Critical care time was exclusive of separately billable procedures and treating other patients.  Critical care was necessary to treat or prevent imminent or life-threatening deterioration.  Critical care was time spent personally by me (independent of midlevel providers or residents) on the following activities: development of treatment plan with patient and/or surrogate as well as nursing, discussions with consultants, evaluation of patient's response to treatment, examination of patient, obtaining history from patient or surrogate, ordering and performing treatments and interventions, ordering and review of laboratory studies, ordering and review of radiographic studies, pulse oximetry and re-evaluation of patient's condition.   Signed, Nelva Bush, MD  07/13/2020 8:17 AM

## 2020-07-13 NOTE — CV Procedure (Signed)
    Cardioversion Note  Hector Mueller [id number] 09-08-1950  Procedure: DC Cardioversion Indications: Unstable atrial fibrillation  Procedure Details Consent: Obtained Time Out: Verified patient identification, verified procedure, site/side was marked, verified correct patient position, special equipment/implants available, Radiology Safety Procedures followed,  medications/allergies/relevent history reviewed, required imaging and test results available.  Performed  The patient has been on adequate anticoagulation.  The patient received IV fentanyl, midazolam, and Precedex for sedation.  Synchronous cardioversion was performed at 200 joules x 1.  The cardioversion was successful with restoration of sinus rhythm.  Post-cardioversion EKG shows resolution of inferior ST elevation.  Complications: No apparent complications Patient did tolerate procedure well.  Recommendations: IV amiodarone bolus/infusion to help maintain sinus rhythm.  Continue IV heparin.  Discontinue metoprolol in the setting of persistent hypotension.  Nelva Bush., MD 07/13/2020, 12:12 PM

## 2020-07-13 NOTE — Progress Notes (Signed)
eLink Physician-Brief Progress Note Patient Name: Hector Mueller DOB: 05/21/1950 MRN: [id number]   Date of Service  07/13/2020  HPI/Events of Note  Called regarding atrial fibrillation with RVR. Has history of A fib. Not currently on [facility] due to issues with bleeding. Echo in 2019 with normal EF. ICU NP had already given pushes of metoprolol and started the patient on a diltiazem drip which is currently running at 15 mg/hour/ Patient very comfortable on camera on vent, SBP in 130s. [name].   eICU Interventions  Check BMP, mag and phos - replete those if those are deficient  Would switch the pressor to phenylephrine which will not cause as much tachycardia to see if this helps - current HR is mostly 118-120, briefly going up to 130s.  If those are not helpful, there may be no choice but to start him on an amiodarone drip especially if he continues to require pressors D/w NP. Please call if needed. Overall guarded prognosis in the setting of AKI, stroke, A fib and inability to anticoagulate.      Intervention Category Major Interventions: Arrhythmia - evaluation and management  Tanita Palinkas G [place] Greb 07/13/2020, 3:13 AM

## 2020-07-13 NOTE — Progress Notes (Signed)
[facility] for heparin infusion Indication: atrial fibrillation  No Known Allergies  Patient Measurements: Height: 6' (182.9 cm) Weight: 81.9 kg (180 lb 8.9 oz) IBW/kg (Calculated) : 77.6  Vital Signs: Temp: 99.86 F (37.7 C) (05/02 1630) Temp Source: Esophageal (05/02 1600) BP: 117/63 (05/02 1630) Pulse Rate: 64 (05/02 1630)  Labs: Recent Labs    07/10/20 2109 07/11/20 0316 07/11/20 0820 07/11/20 2351 07/12/20 0140 07/12/20 0433 07/12/20 0932 07/12/20 1132 07/13/20 0317 07/13/20 0318 07/13/20 1004 07/13/20 1224 07/13/20 1400 07/13/20 1702  HGB 16.0 13.4   < > 13.6 12.3*   < >  --    < >  --  11.8* 12.1*  --   --  11.8*  HCT 46.1 38.9*   < > 40.1 35.0*   < >  --    < >  --  34.7* 35.2*  --   --  34.6*  PLT 128* 105*  --   --  113*  --   --   --   --  119*  --   --   --   --   APTT <24*  --   --   --   --   --  26  --   --   --   --   --   --   --   LABPROT 13.7  --   --   --   --   --  13.8  --   --   --   --   --   --   --   INR 1.0  --   --   --   --   --  1.1  --   --   --   --   --   --   --   HEPARINUNFRC  --   --   --   --   --   --   --   --   --   --   --   --   --  0.29*  CREATININE 3.35* 3.11*   < > 2.89* 2.85*  --   --   --  2.66*  --   --   --   --   --   TROPONINIHS  --   --   --   --   --   --   --   --   --   --   --  32* 34*  --    < > = values in this interval not displayed.    Estimated Creatinine Clearance: 27.6 mL/min (A) (by C-G formula based on SCr of 2.66 mg/dL (H)).   Medical History: Past Medical History:  Diagnosis Date  . Atrial fibrillation ([place])   . BPH (benign prostatic hyperplasia)   . Cancer ([place])    liver  . Cirrhosis ([place])   . Diabetes mellitus without complication ([place])   . GERD (gastroesophageal reflux disease)   . Hepatitis C     Assessment: 70 y/o M with medical history including cirrhosis / liver cancer s/p liver transplant in 2018, pulmonary arterial hypertension, diabetes,  Afib, BPH, substance abuse who is admitted with acute hypoxic respiratory failure secondary to acute encephalopathy / possible CVA and hyperglycemic emergency / AKI. Pt not on anticoagulation prior to admission. reports of coffee ground emesis on 4/29 and has since resolved. Pharmacy has been consulted for heparin dosing and monitoring for afib. Hgb 12.1, Hct 35.2;  aPTT/PT/INR wnl.   Date Time HL Rate/Comment 5/2 1702 0.29 Slightly < goal; 1200>>1350 un/hr   Goal of Therapy:  Heparin level 0.3-0.7 units/ml Monitor platelets by anticoagulation protocol: Yes   Plan:  Slightly below target HL range.  Give 1250 units bolus x 1; then increase heparin infusion to 1350 units/hr. Check anti-Xa level in 8 hours and daily while on heparin Continue to monitor H&H and platelets  Lorna Dibble, PharmD 07/13/2020 6:06 PM

## 2020-07-13 NOTE — Unmapped (Signed)
07/13/20-Wife called back and requested we hold all med pt is hospitalized ,and currently on ventilator -CB

## 2020-07-14 ENCOUNTER — Ambulatory Visit: Admit: 2020-07-14 | Payer: MEDICARE

## 2020-07-14 ENCOUNTER — Encounter: Payer: Self-pay | Admitting: Pulmonary Disease

## 2020-07-14 ENCOUNTER — Other Ambulatory Visit: Payer: Self-pay

## 2020-07-14 ENCOUNTER — Inpatient Hospital Stay: Payer: Medicare Other

## 2020-07-14 DIAGNOSIS — J9601 Acute respiratory failure with hypoxia: Secondary | ICD-10-CM | POA: Diagnosis not present

## 2020-07-14 DIAGNOSIS — R4182 Altered mental status, unspecified: Secondary | ICD-10-CM | POA: Diagnosis not present

## 2020-07-14 DIAGNOSIS — I429 Cardiomyopathy, unspecified: Secondary | ICD-10-CM

## 2020-07-14 DIAGNOSIS — E1111 Type 2 diabetes mellitus with ketoacidosis with coma: Secondary | ICD-10-CM | POA: Diagnosis not present

## 2020-07-14 DIAGNOSIS — I480 Paroxysmal atrial fibrillation: Secondary | ICD-10-CM | POA: Diagnosis not present

## 2020-07-14 LAB — HEMOGLOBIN AND HEMATOCRIT, BLOOD
HCT: 28.6 % — ABNORMAL LOW (ref 39.0–52.0)
HCT: 29.5 % — ABNORMAL LOW (ref 39.0–52.0)
HCT: 29.9 % — ABNORMAL LOW (ref 39.0–52.0)
Hemoglobin: 10.1 g/dL — ABNORMAL LOW (ref 13.0–17.0)
Hemoglobin: 10.2 g/dL — ABNORMAL LOW (ref 13.0–17.0)
Hemoglobin: 9.9 g/dL — ABNORMAL LOW (ref 13.0–17.0)

## 2020-07-14 LAB — GLUCOSE, CAPILLARY
Glucose-Capillary: 404 mg/dL — ABNORMAL HIGH (ref 70–99)
Glucose-Capillary: 469 mg/dL — ABNORMAL HIGH (ref 70–99)
Glucose-Capillary: 462 mg/dL — ABNORMAL HIGH (ref 70–99)
Glucose-Capillary: 384 mg/dL — ABNORMAL HIGH (ref 70–99)
Glucose-Capillary: 298 mg/dL — ABNORMAL HIGH (ref 70–99)
Glucose-Capillary: 252 mg/dL — ABNORMAL HIGH (ref 70–99)
Glucose-Capillary: 248 mg/dL — ABNORMAL HIGH (ref 70–99)
Glucose-Capillary: 238 mg/dL — ABNORMAL HIGH (ref 70–99)
Glucose-Capillary: 209 mg/dL — ABNORMAL HIGH (ref 70–99)
Glucose-Capillary: 219 mg/dL — ABNORMAL HIGH (ref 70–99)
Glucose-Capillary: 170 mg/dL — ABNORMAL HIGH (ref 70–99)
Glucose-Capillary: 162 mg/dL — ABNORMAL HIGH (ref 70–99)
Glucose-Capillary: 175 mg/dL — ABNORMAL HIGH (ref 70–99)

## 2020-07-14 LAB — BASIC METABOLIC PANEL
Anion gap: 10 (ref 5–15)
BUN: 46 mg/dL — ABNORMAL HIGH (ref 8–23)
CO2: 22 mmol/L (ref 22–32)
Calcium: 7.7 mg/dL — ABNORMAL LOW (ref 8.9–10.3)
Chloride: 100 mmol/L (ref 98–111)
Creatinine, Ser: 2.85 mg/dL — ABNORMAL HIGH (ref 0.61–1.24)
GFR, Estimated: 23 mL/min — ABNORMAL LOW (ref >60)
Glucose, Bld: 451 mg/dL — ABNORMAL HIGH (ref 70–99)
Potassium: 4 mmol/L (ref 3.5–5.1)
Sodium: 132 mmol/L — ABNORMAL LOW (ref 135–145)

## 2020-07-14 LAB — CULTURE, RESPIRATORY W GRAM STAIN: Culture: NORMAL

## 2020-07-14 LAB — CBC
HCT: 33.2 % — ABNORMAL LOW (ref 39.0–52.0)
Hemoglobin: 11.3 g/dL — ABNORMAL LOW (ref 13.0–17.0)
MCH: 30.5 pg (ref 26.0–34.0)
MCHC: 34 g/dL (ref 30.0–36.0)
MCV: 89.5 fL (ref 80.0–100.0)
Platelets: 99 10*3/uL — ABNORMAL LOW (ref 150–400)
RBC: 3.71 MIL/uL — ABNORMAL LOW (ref 4.22–5.81)
RDW: 13.2 % (ref 11.5–15.5)
WBC: 7.2 10*3/uL (ref 4.0–10.5)
nRBC: 0 % (ref 0.0–0.2)

## 2020-07-14 LAB — PHOSPHORUS: Phosphorus: 2.6 mg/dL (ref 2.5–4.6)

## 2020-07-14 LAB — TRIGLYCERIDES: Triglycerides: 215 mg/dL — ABNORMAL HIGH (ref <150)

## 2020-07-14 LAB — POTASSIUM: Potassium: 3.6 mmol/L (ref 3.5–5.1)

## 2020-07-14 LAB — HEPARIN LEVEL (UNFRACTIONATED)
Heparin Unfractionated: 0.35 IU/mL (ref 0.30–0.70)
Heparin Unfractionated: 0.32 IU/mL (ref 0.30–0.70)
Heparin Unfractionated: 0.24 IU/mL — ABNORMAL LOW (ref 0.30–0.70)

## 2020-07-14 LAB — MAGNESIUM: Magnesium: 2.3 mg/dL (ref 1.7–2.4)

## 2020-07-14 MED ORDER — ROSUVASTATIN 5 MG TABLET
ORAL_TABLET | 1 refills | 0 days | Status: CP
Start: 2020-07-14 — End: ?
  Filled 2020-07-15: qty 90, 90d supply, fill #0

## 2020-07-14 MED ORDER — INSULIN REGULAR(HUMAN) IN NACL 100-0.9 UT/100ML-% IV SOLN
INTRAVENOUS | Status: DC
  Administered 2020-07-14: 10 [IU]/h via INTRAVENOUS
  Filled 2020-07-14: qty 100

## 2020-07-14 MED ORDER — INSULIN ASPART 100 UNIT/ML IJ SOLN
0.0000 [IU] | INTRAMUSCULAR | Status: DC
Start: 2020-07-14 — End: 2020-07-14

## 2020-07-14 MED ORDER — INSULIN GLARGINE 100 UNIT/ML ~~LOC~~ SOLN
30.0000 [IU] | Freq: Two times a day (BID) | SUBCUTANEOUS | Status: DC
  Administered 2020-07-14 – 2020-07-15 (×3): 30 [IU] via SUBCUTANEOUS
  Filled 2020-07-14 (×5): qty 0.3

## 2020-07-14 MED ORDER — POLYVINYL ALCOHOL 1.4 % OP SOLN
1.0000 [drp] | OPHTHALMIC | Status: DC | PRN
  Administered 2020-07-14 – 2020-07-15 (×3): 1 [drp] via OPHTHALMIC
  Filled 2020-07-14: qty 15

## 2020-07-14 MED ORDER — INSULIN REGULAR(HUMAN) IN NACL 100-0.9 UT/100ML-% IV SOLN
INTRAVENOUS | Status: DC
  Administered 2020-07-14: 3.4 [IU]/h via INTRAVENOUS

## 2020-07-14 MED ORDER — MIDODRINE HCL 5 MG PO TABS: 5.0000 mg | ORAL_TABLET | Freq: Three times a day (TID) | ORAL | Status: DC

## 2020-07-14 MED ORDER — ORAL CARE MOUTH RINSE
15.0000 mL | Freq: Two times a day (BID) | OROMUCOSAL | Status: DC
  Administered 2020-07-14 – 2020-07-20 (×9): 15 mL via OROMUCOSAL

## 2020-07-14 MED ORDER — INSULIN GLARGINE 100 UNIT/ML ~~LOC~~ SOLN
20.0000 [IU] | Freq: Every day | SUBCUTANEOUS | Status: DC
  Filled 2020-07-14: qty 0.2

## 2020-07-14 MED ORDER — ACETAMINOPHEN 10 MG/ML IV SOLN
1000.0000 mg | Freq: Once | INTRAVENOUS | Status: AC
  Administered 2020-07-14: 1000 mg via INTRAVENOUS
  Filled 2020-07-14: qty 100

## 2020-07-14 MED ORDER — METOPROLOL SUCCINATE ER 25 MG PO TB24
12.5000 mg | ORAL_TABLET | Freq: Every day | ORAL | Status: DC
  Administered 2020-07-15: 12.5 mg via ORAL
  Filled 2020-07-14 (×3): qty 0.5

## 2020-07-14 MED ORDER — DEXTROSE 50 % IV SOLN: 0.0000 mL | INTRAVENOUS | Status: DC | PRN

## 2020-07-14 MED ORDER — INSULIN GLARGINE 100 UNIT/ML ~~LOC~~ SOLN
5.0000 [IU] | Freq: Once | SUBCUTANEOUS | Status: AC
  Administered 2020-07-14: 5 [IU] via SUBCUTANEOUS
  Filled 2020-07-14: qty 0.05

## 2020-07-14 MED ORDER — INSULIN GLARGINE 100 UNIT/ML ~~LOC~~ SOLN: 30.0000 [IU] | Freq: Two times a day (BID) | SUBCUTANEOUS | Status: DC

## 2020-07-14 MED ORDER — HEPARIN BOLUS VIA INFUSION
1200.0000 [IU] | Freq: Once | INTRAVENOUS | Status: AC
  Administered 2020-07-14: 1200 [IU] via INTRAVENOUS
  Filled 2020-07-14: qty 1200

## 2020-07-14 MED ORDER — INSULIN ASPART 100 UNIT/ML IJ SOLN
0.0000 [IU] | INTRAMUSCULAR | Status: DC
  Administered 2020-07-14: 3 [IU] via SUBCUTANEOUS
  Administered 2020-07-15: 8 [IU] via SUBCUTANEOUS
  Administered 2020-07-15: 2 [IU] via SUBCUTANEOUS
  Administered 2020-07-15: 5 [IU] via SUBCUTANEOUS
  Filled 2020-07-14 (×4): qty 1

## 2020-07-14 MED ORDER — INSULIN ASPART 100 UNIT/ML IJ SOLN
0.0000 [IU] | INTRAMUSCULAR | Status: DC
  Administered 2020-07-14: 15 [IU] via SUBCUTANEOUS

## 2020-07-14 NOTE — Progress Notes (Signed)
PCCM note  Elevated sugar 440  9 units novolog and 5 units lantus now Increase lantus dose from 15 units to 20 units qHS  Marshell Garfinkel MD [facility] 07/14/2020, 12:05 AM

## 2020-07-14 NOTE — Progress Notes (Addendum)
[facility] Progress Note Patient Name: Hector Mueller DOB: 1950/10/13 MRN: [id number]   Date of Service  07/14/2020  HPI/Events of Note  Patient needs medication for pain, he is NPO until a.m. having been extubated earlier today, he has acute kidney injury but LFT's are within normal limits, he is too drowsy to receive iv narcotics tonight.  eICU Interventions  iv Tylenol  1 gram x 1 tonight ordered.        [place] 07/14/2020, 8:57 PM

## 2020-07-14 NOTE — Progress Notes (Signed)
NAME:  Hector Mueller, MRN:  [id number], DOB:  1951/02/28, LOS: 3 ADMISSION DATE:  07/10/2020, CONSULTATION DATE: 07/10/2020 REFERRING MD: Dr. Joni Fears, CHIEF COMPLAINT: Altered mental status  History of Present Illness:  70 year old male who was [facility] to the [facility] ED due to altered mental status.  Per ED documentation the patient was reportedly in his usual state of health last night on 07/09/20 until about 11:30 PM.  This morning the patient was noted to have difficulty walking, but was talking and interacting normally with family.  However today at noon he was found to be unable to stand or move purposefully as well as being unable to speak.   Interim History / Subjective:  Repeat head CT was negative for acute findings, could not do MRI due to pacemaker in place. Patient underwent cardioversion for A. fib, now in sinus rhythm Tolerating pressure support trial  Objective   Blood pressure 112/60, pulse 62, temperature 98.2 F (36.8 C), resp. rate 14, height 6' (1.829 m), weight 78.5 kg, SpO2 98 %.    Vent Mode: PRVC FiO2 (%):  [30 %] 30 % Set Rate:  [16 bmp] 16 bmp Vt Set:  [500 mL] 500 mL PEEP:  [5 cmH20] 5 cmH20 Plateau Pressure:  [17 cmH20] 17 cmH20   Intake/Output Summary (Last 24 hours) at 07/14/2020 0920 Last data filed at 07/14/2020 0400 Gross per 24 hour  Intake 1598.06 ml  Output 575 ml  Net 1023.06 ml   Filed Weights   07/12/20 0500 07/13/20 0500 07/14/20 0500  Weight: 75.8 kg 81.9 kg 78.5 kg    Examination: General: Adult male, critically ill, lying in bed, orally intubated HEENT: MM pink/moist, anicteric, atraumatic, neck supple Neuro: Opens eyes with vocal stimuli, following simple commands, moving all 4 extremities CV: Regular rate and rhythm, no murmur Pulm: Clear to auscultation bilaterally, no wheezes or rhonchi GI: soft, nondistended, non tender, bs x 4 GU: foley in place with clear yellow urine Skin: no rashes/lesions noted Extremities: warm/dry, pulses  2+   Assessment & Plan:  Acute Hypoxic Respiratory Failure secondary to acute encephalopathy in the setting of HHS Acute stroke was ruled out Acute encephalopathy, multifactorial. metabolic/ suspected CVA Poorly controlled diabetes type 2 presented with HHS and now again with hyperglycemia Patient is tolerating pressure support trial, his mental status is improved, he is following commands Repeat head CT yesterday ruled out acute stroke Initial head CT and CTA head and neck was negative for acute stroke/LVO Unable to get MRI due to pacemaker placement Sedation was stopped with RASS goal 0 Started on insulin infusion per Endo tool Monitor electrolytes and blood sugar Ammonia level was 9  Paroxysmal A. fib status post cardioversion Patient underwent DC cardioversion yesterday by cardiology, currently in sinus rhythm with controlled rate in 60s Diltiazem was stopped, currently on MU [place] infusion Continue IV heparin infusion for stroke prophylaxis  Acute systolic congestive heart failure Echocardiogram was done which showed EF 40 to 45% He will need to be started on beta-blocker/ACE once blood pressure improves He will need ischemic work-up once stable from respiratory standpoint  Acute Kidney Injury on CKD Baseline Cr: 1.6, Cr on admission: 3.35, likely prerenal Serum creatinine still remain elevated to 2.8 Monitor BMP and electrolytes Avoid nephrotoxic agents as able, ensure adequate renal perfusion  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 No more coffee-ground emesis H&H remained stable Continue cyclosporine and mycophenolate at home Cyclosporine level is pending  Monitor for signs and symptoms of bleeding Transfuse if hemoglobin less than 7  Pulmonary arterial hypertension Who class I Hold sildenafil due to hypotension Continuous cardiac monitoring  Best practice (right click and  "Reselect all SmartList Selections" daily)  Diet:  NPO tube feeds Pain/Anxiety/Delirium protocol (if indicated): N/A VAP protocol (if indicated): Yes DVT prophylaxis: Systemic heparin GI prophylaxis: PPI Glucose control: Insulin infusion Central venous access:  N/A Arterial line:  N/A Foley:  Yes, and it is still needed due to urinary retention, voiding trial today Mobility:  bed rest  PT consulted: N/A Last date of multidisciplinary goals of care discussion 5/1 at bedside with patient's wife and daughter, continue aggressive pulm care Code Status:  full code Disposition: ICU  Labs   CBC: Recent Labs  Lab 07/10/20 2109 07/11/20 0316 07/11/20 1228 07/12/20 0140 07/12/20 0433 07/13/20 0318 07/13/20 1004 07/13/20 1702 07/13/20 2244 07/14/20 0200 07/14/20 0826  WBC 13.6* 9.3  --  11.9*  --  6.6  --   --   --  7.2  --   NEUTROABS 12.3*  --   --   --   --  5.0  --   --   --   --   --   HGB 16.0 13.4   < > 12.3*   < > 11.8* 12.1* 11.8* 10.6* 11.3* 10.1*  HCT 46.1 38.9*   < > 35.0*   < > 34.7* 35.2* 34.6* 31.1* 33.2* 29.9*  MCV 87.8 90.5  --  87.1  --  89.4  --   --   --  89.5  --   PLT 128* 105*  --  113*  --  119*  --   --   --  99*  --    < > = values in this interval not displayed.    Basic Metabolic Panel: Recent Labs  Lab 07/11/20 1700 07/11/20 2351 07/12/20 0140 07/12/20 0932 07/12/20 1610 07/13/20 0317 07/13/20 1702 07/14/20 0200  NA 136 136 137  --   --  133*  --  132*  K 2.9* 3.4* 3.6  --   --  4.1  --  4.0  CL 99 101 99  --   --  99  --  100  CO2 27 23 26   --   --  22  --  22  GLUCOSE 160* 176* 170*  --   --  292*  --  451*  BUN 50* 47* 46*  --   --  42*  --  46*  CREATININE 2.91* 2.89* 2.85*  --   --  2.66*  --  2.85*  CALCIUM 9.1 9.1 9.0  --   --  8.0*  --  7.7*  MG  --   --  1.8 2.1 2.1 1.9 2.3 2.3  PHOS  --   --  <1.0* 4.0 2.7 3.6 3.2 2.6   GFR: Estimated Creatinine Clearance: 25.7 mL/min (A) (by C-G formula based on SCr of 2.85 mg/dL  (H)). Recent Labs  Lab 07/10/20 2155 07/10/20 2355 07/11/20 0316 07/12/20 0140 07/13/20 0318 07/14/20 0200  PROCALCITON  --   --  0.24 0.57 2.95  --   WBC  --   --  9.3 11.9* 6.6 7.2  LATICACIDVEN 3.2* 3.8*  --   --   --   --     Liver Function Tests: Recent Labs  Lab 07/10/20 2109  AST 14*  ALT 11  ALKPHOS 88  BILITOT 1.7*  PROT 7.9  ALBUMIN 4.7   No results for input(s): LIPASE, AMYLASE in the last 168 hours. Recent Labs  Lab 07/11/20 0316 07/13/20 0842  AMMONIA 23 <9*    ABG    Component Value Date/Time   PHART 7.36 07/11/2020 0013   PCO2ART 36 07/11/2020 0013   PO2ART 58 (L) 07/11/2020 0013   HCO3 20.3 07/11/2020 0013   ACIDBASEDEF 4.5 (H) 07/11/2020 0013   O2SAT 88.5 07/11/2020 0013     Coagulation Profile: Recent Labs  Lab 07/10/20 2109 07/12/20 0932  INR 1.0 1.1    Cardiac Enzymes: No results for input(s): CKTOTAL, CKMB, CKMBINDEX, TROPONINI in the last 168 hours.  HbA1C: Hgb A1c MFr Bld  Date/Time Value Ref Range Status  07/11/2020 03:16 AM 10.8 (H) 4.8 - 5.6 % Final    Comment:    (NOTE) Pre diabetes:          5.7%-6.4%  Diabetes:              >6.4%  Glycemic control for   <7.0% adults with diabetes     CBG: Recent Labs  Lab 07/13/20 1616 07/13/20 1917 07/13/20 2349 07/14/20 0332 07/14/20 0710  GLUCAP 295* 276* 440* 404* 469*    Critical care time:     Total critical care time: 42 minutes  Performed by: [place] care time was exclusive of separately billable procedures and treating other patients.   Critical care was necessary to treat or prevent imminent or life-threatening deterioration.   Critical care was time spent personally by me on the following activities: development of treatment plan with patient and/or surrogate as well as nursing, discussions with consultants, evaluation of patient's response to treatment, examination of patient, obtaining history from patient or surrogate, ordering and  performing treatments and interventions, ordering and review of laboratory studies, ordering and review of radiographic studies, pulse oximetry and re-evaluation of patient's condition.   Jacky Kindle MD [facility] See Amion for pager If no response to pager, please call [phone] until 7pm After 7pm, Please call E-link [phone]

## 2020-07-14 NOTE — Progress Notes (Signed)
ANTICOAGULATION CONSULT NOTE  Pharmacy Consult for heparin infusion Indication: atrial fibrillation  No Known Allergies  Patient Measurements: Height: 6' (182.9 cm) Weight: 81.9 kg (180 lb 8.9 oz) IBW/kg (Calculated) : 77.6  Vital Signs: Temp: 98.8 F (37.1 C) (05/03 0200) Temp Source: Esophageal (05/02 1600) BP: 89/54 (05/03 0200) Pulse Rate: 62 (05/03 0200)  Labs: Recent Labs    07/11/20 2351 07/12/20 0140 07/12/20 0433 07/12/20 0932 07/12/20 1132 07/13/20 0317 07/13/20 0318 07/13/20 1004 07/13/20 1224 07/13/20 1400 07/13/20 1702 07/13/20 2244 07/14/20 0200  HGB 13.6 12.3*   < >  --    < >  --  11.8*   < >  --   --  11.8* 10.6* 11.3*  HCT 40.1 35.0*   < >  --    < >  --  34.7*   < >  --   --  34.6* 31.1* 33.2*  PLT  --  113*  --   --   --   --  119*  --   --   --   --   --  99*  APTT  --   --   --  26  --   --   --   --   --   --   --   --   --   LABPROT  --   --   --  13.8  --   --   --   --   --   --   --   --   --   INR  --   --   --  1.1  --   --   --   --   --   --   --   --   --   HEPARINUNFRC  --   --   --   --   --   --   --   --   --   --  0.29*  --  0.35  CREATININE 2.89* 2.85*  --   --   --  2.66*  --   --   --   --   --   --   --   TROPONINIHS  --   --   --   --   --   --   --   --  32* 34*  --   --   --    < > = values in this interval not displayed.    Estimated Creatinine Clearance: 27.6 mL/min (A) (by C-G formula based on SCr of 2.66 mg/dL (H)).   Medical History: Past Medical History:  Diagnosis Date  . Atrial fibrillation ([place])   . BPH (benign prostatic hyperplasia)   . Cancer ([place])    liver  . Cirrhosis ([place])   . Diabetes mellitus without complication ([place])   . GERD (gastroesophageal reflux disease)   . Hepatitis C     Assessment: 70 y/o M with medical history including cirrhosis / liver cancer s/p liver transplant in 2018, pulmonary arterial hypertension, diabetes, Afib, BPH, substance abuse who is admitted with acute hypoxic  respiratory failure secondary to acute encephalopathy / possible CVA and hyperglycemic emergency / AKI. Pt not on anticoagulation prior to admission. reports of coffee ground emesis on 4/29 and has since resolved. Pharmacy has been consulted for heparin dosing and monitoring for afib. Hgb 12.1, Hct 35.2; aPTT/PT/INR wnl.   Date Time HL Rate/Comment 5/2 1702 0.29 Slightly < goal; 1200>>1350 un/hr  5/2 0200 0.35 Therapeutic x 1  Goal of Therapy:  Heparin level 0.3-0.7 units/ml Monitor platelets by anticoagulation protocol: Yes   Plan:  Continue heparin infusion at 1350 units/hr. H&H up, Platelets down to 99 from 119. Check HL in 8 hrs to confirm, then daily. Continue to monitor H&H and platelets  Renda Rolls, PharmD, [facility] 07/14/2020 2:36 AM

## 2020-07-14 NOTE — Evaluation (Signed)
Occupational Therapy Evaluation Patient Details Name: Hector Mueller MRN: [id number] DOB: 1950/07/21 Today's Date: 07/14/2020    History of Present Illness 70 y/o male with h/o liver cirrhosis due to hepatitis C status post liver transplant in 2018, DM, BPH, GERD, AFib and CKD who is admitted with AKI and possible CVA   Clinical Impression   Patient presenting with decreased I in self care, balance, functional mobility/transfers, endurance, and safety awareness. No family present to confirm baseline. Pt reports living at home with wife independently PTA. Pt is pleasant and cooperative throughout session.  Patient currently functioning at mod +2 assistance for physical assist and management of lines/leads. Pt needing mod A of 2 for sit >supine. Pt then reports feeling very dizzy with BP dropping to 84/63 while on EOB and pt reports dizziness getting worse and seeing spots in vision. Pt returned to supine and repositioned with max of 2. Pt is observed to feed self an ice chip but then coughs. RN notified. Patient will benefit from acute OT to increase overall independence in the areas of ADLs, functional mobility, and safety awareness in order to safely discharge to next venue of care.    Follow Up Recommendations  SNF    Equipment Recommendations  Other (comment) (defer to next venue of care)       Precautions / Restrictions Precautions Precautions: Fall Precaution Comments: orthostatic hypotension Restrictions Weight Bearing Restrictions: No      Mobility Bed Mobility Overal bed mobility: Needs Assistance Bed Mobility: Supine to Sit;Sit to Supine     Supine to sit: Mod assist;+2 for physical assistance;+2 for safety/equipment Sit to supine: Max assist;+2 for safety/equipment;+2 for physical assistance   General bed mobility comments: mod multimodal cuing for hand placement and technique    Transfers                 General transfer comment: deferred secondary to  hypotension    Balance Overall balance assessment: Needs assistance Sitting-balance support: Feet supported Sitting balance-Leahy Scale: Fair Sitting balance - Comments: CGA for balance                                   ADL either performed or assessed with clinical judgement   ADL Overall ADL's : Needs assistance/impaired Eating/Feeding: Set up;Sitting   Grooming: Wash/dry hands;Wash/dry face;Bed level;Set up               Lower Body Dressing: Total assistance Lower Body Dressing Details (indicate cue type and reason): to donn B socks                     Vision Patient Visual Report: No change from baseline              Pertinent Vitals/Pain Pain Assessment: Faces Faces Pain Scale: Hurts even more Pain Location: lower back Pain Descriptors / Indicators: Aching;Discomfort Pain Intervention(s): Limited activity within patient's tolerance;Monitored during session;Repositioned     Hand Dominance Right   Extremity/Trunk Assessment Upper Extremity Assessment Upper Extremity Assessment: Generalized weakness   Lower Extremity Assessment Lower Extremity Assessment: Generalized weakness       Communication Communication Communication: No difficulties   Cognition Arousal/Alertness: Awake/alert Behavior During Therapy: Flat affect Overall Cognitive Status: No family/caregiver present to determine baseline cognitive functioning  General Comments: Pt oriented to self and location. Pt very pleasant and cooperative but needing cuing to initiate and sequence tasks.              Home Living Family/patient expects to be discharged to:: Private residence Living Arrangements: Spouse/significant other Available Help at Discharge: Family;Available 24 hours/day Type of Home: House Home Access: Stairs to enter [employer] of Steps: 7 Entrance Stairs-Rails: Can reach both Home Layout: One  level     Bathroom Shower/Tub: Occupational psychologist: Handicapped height     Home Equipment: Clinical cytogeneticist - 2 wheels;Walker - 4 wheels          Prior Functioning/Environment Level of Independence: Independent        Comments: home equipment obtained from chart review from previous admission. Pt reports being independent without use of AD at baseline.        OT Problem List: Decreased strength;Impaired balance (sitting and/or standing);Decreased cognition;Decreased safety awareness;Decreased activity tolerance;Decreased knowledge of use of DME or AE      OT Treatment/Interventions: Self-care/ADL training;Manual therapy;Therapeutic exercise;Modalities;Patient/family education;Balance training;Energy conservation;Therapeutic activities;Cognitive remediation/compensation    OT Goals(Current goals can be found in the care plan section) Acute Rehab OT Goals Patient Stated Goal: to get better OT Goal Formulation: With patient Time For Goal Achievement: 07/28/20 Potential to Achieve Goals: Good ADL Goals Pt Will Perform Grooming: with supervision;standing Pt Will Perform Lower Body Dressing: with supervision;sit to/from stand Pt Will Transfer to Toilet: with supervision;ambulating Pt Will Perform Toileting - Clothing Manipulation and hygiene: with supervision;sit to/from stand  OT Frequency: Min 2X/week   Barriers to D/C:    none known at this time       Co-evaluation PT/OT/SLP Co-Evaluation/Treatment: Yes Reason for Co-Treatment: Complexity of the patient's impairments (multi-system involvement);To address functional/ADL transfers;For patient/therapist safety PT goals addressed during session: Mobility/safety with mobility;Balance OT goals addressed during session: ADL's and self-care      AM-PAC OT "6 Clicks" Daily Activity     Outcome Measure Help from another person eating meals?: A Little Help from another person taking care of personal grooming?:  A Little Help from another person toileting, which includes using toliet, bedpan, or urinal?: A Lot Help from another person bathing (including washing, rinsing, drying)?: A Lot Help from another person to put on and taking off regular upper body clothing?: A Little Help from another person to put on and taking off regular lower body clothing?: A Lot 6 Click Score: 15   End of Session Nurse Communication: Mobility status;Precautions  Activity Tolerance: Patient limited by fatigue;Other (comment) (limited by hypotension/dizziness) Patient left: in bed;with call bell/phone within reach;with bed alarm set  OT Visit Diagnosis: Unsteadiness on feet (R26.81);Repeated falls (R29.6);Muscle weakness (generalized) (M62.81)                Time: 1423-1440 OT Time Calculation (min): 17 min Charges:  OT General Charges $OT Visit: 1 Visit OT Evaluation $OT Eval Moderate Complexity: 1 [address], [place], OTR/L , CBIS ascom [phone]  07/14/20, 2:58 PM

## 2020-07-14 NOTE — Progress Notes (Signed)
eLink Physician-Brief Progress Note Patient Name: Hector Mueller DOB: 10-20-1950 MRN: [id number]   Date of Service  07/14/2020  HPI/Events of Note  Notified of glucose in the 400s Lantus increased to 20 overnight from 15 Tube feeding at goal  eICU Interventions  Increase SSI to moderate dose from low dose     Intervention Category Major Interventions: Hyperglycemia - active titration of insulin therapy  Ky Moskowitz T Zuriah Bordas 07/14/2020, 4:41 AM

## 2020-07-14 NOTE — Progress Notes (Signed)
Nutrition Follow Up Note   DOCUMENTATION CODES:   Not applicable  INTERVENTION:   RD will monitor for diet advancement and add supplements pending SLP evaluation  NUTRITION DIAGNOSIS:   Inadequate oral intake related to inability to eat (pt sedated and ventilated) as evidenced by NPO status.  GOAL:   Patient will meet greater than or equal to 90% of their needs  -previously on tube feeds   MONITOR:   Diet advancement,Labs,Weight trends,Skin,I & O's  ASSESSMENT:   70 y/o male with h/o liver cirrhosis due to hepatitis C status post liver transplant in 2018, DM, BPH, GERD, AFib and CKD who is admitted with AKI and possible CVA   Pt status post cardioversion 5/2  Pt extubated today; tolerating well. Plan is for SLP evaluation today. RD will add supplements pending SLP recommendations. PT to see patient today. Pt lethargic and sleepy at time of RD visit; pt is following commands. Pt with hyperglycemia and on insulin drip.    Per chart, pt up ~19lbs since admit.   Medications reviewed and include: colace, cellcept, protonix, miralax, heparin, insulin  Labs reviewed: Na 132(L), BUN 46(H), creat 2.85(H), P 2.6 wnl, Mg 2.3 wnl Hgb 10.1(L), Hct 29.9(L) cbgs- 404, 469, 462, 384, 298 x 24 hrs  Diet Order:   Diet Order            Diet NPO time specified  Diet effective now                EDUCATION NEEDS:   No education needs have been identified at this time  Skin:  Skin Assessment: Reviewed RN Assessment (ecchymosis)  Last BM:  pta  Height:   Ht Readings from Last 1 Encounters:  07/13/20 6' (1.829 m)    Weight:   Wt Readings from Last 1 Encounters:  07/14/20 78.5 kg    Ideal Body Weight:  83.6 kg  BMI:  Body mass index is 23.47 kg/m.  Estimated Nutritional Needs:   Kcal:  2100-2400kcal/day  Protein:  105-120g/day  Fluid:  2.1-2.4L/day  Koleen Distance MS, RD, LDN Please refer to [facility] for RD and/or RD on-call/weekend/after hours pager

## 2020-07-14 NOTE — Plan of Care (Addendum)
Patient extubated to 3 liters [place] at 10AM, he soon removed his O2 and maintained adequate sats subsequently, on room air at this time.  Pt conversive, oriented but repetitive and forgetful.  Family at bedside.  ABD is soft, although pt states "I probably have fluid in my belly."  Endotool continues at this time.  SLP pending. Pt appeared to do well for this RN with a few ice chips, but reportedly coughed and cleared his throat when PT gave him some, holding ice chips at this time.

## 2020-07-14 NOTE — Progress Notes (Addendum)
First CBG< 200 at 1800.  CBG is 170, insulin infusion at 1.9 units/hr.  MD Chand notified for possible D5 infusion.  Per him we will give lantus now and turn insulin infusion off 2 hours later, no D5 at this time

## 2020-07-14 NOTE — Progress Notes (Signed)
ANTICOAGULATION CONSULT NOTE  Pharmacy Consult for heparin infusion Indication: atrial fibrillation  No Known Allergies  Patient Measurements: Height: 6' (182.9 cm) Weight: 78.5 kg (173 lb 1 oz) IBW/kg (Calculated) : 77.6  Vital Signs: Temp: 98.2 F (36.8 C) (05/03 0600) BP: 112/60 (05/03 0600) Pulse Rate: 62 (05/03 0600)  Labs: Recent Labs    07/12/20 0140 07/12/20 0433 07/12/20 0932 07/12/20 1132 07/13/20 0317 07/13/20 0318 07/13/20 1004 07/13/20 1224 07/13/20 1400 07/13/20 1702 07/13/20 2244 07/14/20 0200 07/14/20 0826  HGB 12.3*   < >  --    < >  --  11.8*   < >  --   --  11.8* 10.6* 11.3* 10.1*  HCT 35.0*   < >  --    < >  --  34.7*   < >  --   --  34.6* 31.1* 33.2* 29.9*  PLT 113*  --   --   --   --  119*  --   --   --   --   --  99*  --   APTT  --   --  26  --   --   --   --   --   --   --   --   --   --   LABPROT  --   --  13.8  --   --   --   --   --   --   --   --   --   --   INR  --   --  1.1  --   --   --   --   --   --   --   --   --   --   HEPARINUNFRC  --   --   --   --   --   --   --   --   --  0.29*  --  0.35 0.32  CREATININE 2.85*  --   --   --  2.66*  --   --   --   --   --   --  2.85*  --   TROPONINIHS  --   --   --   --   --   --   --  32* 34*  --   --   --   --    < > = values in this interval not displayed.    Estimated Creatinine Clearance: 25.7 mL/min (A) (by C-G formula based on SCr of 2.85 mg/dL (H)).   Medical History: Past Medical History:  Diagnosis Date  . Atrial fibrillation ([place])   . BPH (benign prostatic hyperplasia)   . Cancer ([place])    liver  . Cirrhosis ([place])   . Diabetes mellitus without complication ([place])   . GERD (gastroesophageal reflux disease)   . Hepatitis C     Assessment: 70 y/o M with medical history including cirrhosis / liver cancer s/p liver transplant in 2018, pulmonary arterial hypertension, diabetes, Afib, BPH, substance abuse who is admitted with acute hypoxic respiratory failure secondary to acute  encephalopathy / possible CVA and hyperglycemic emergency / AKI. Pt not on anticoagulation prior to admission. reports of coffee ground emesis on 4/29 and has since resolved. Pharmacy has been consulted for heparin dosing and monitoring for afib. Hgb 12.1, Hct 35.2; aPTT/PT/INR wnl.   Date Time HL Rate/Comment 5/2 1702 0.29 Slightly < goal; 1200>>1350 un/hr 5/2 0200 0.35 Therapeutic x 1 5/3  0826 0.32 Therapeutic x2  Goal of Therapy:  Heparin level 0.3-0.7 units/ml Monitor platelets by anticoagulation protocol: Yes   Plan:  Continue heparin infusion at 1350 units/hr. H&H up, Platelets down to 99 from 119. Will recheck HL in 8 hrs to confirm since pt is trending down Continue to monitor H&H and platelets  Sherilyn Banker, PharmD Pharmacy Resident  07/14/2020 9:59 AM

## 2020-07-14 NOTE — Progress Notes (Signed)
Midnight CBG is 441, notified MD. Received orders for 5 more units of Lantus

## 2020-07-14 NOTE — Progress Notes (Signed)
ANTICOAGULATION CONSULT NOTE  Pharmacy Consult for heparin infusion Indication: atrial fibrillation  No Known Allergies  Patient Measurements: Height: 6' (182.9 cm) Weight: 78.5 kg (173 lb 1 oz) IBW/kg (Calculated) : 77.6  Vital Signs: Temp: 97.9 F (36.6 C) (05/03 1600) Temp Source: Oral (05/03 1600) BP: 130/60 (05/03 1600) Pulse Rate: 65 (05/03 1600)  Labs: Recent Labs    07/12/20 0140 07/12/20 0433 07/12/20 0932 07/12/20 1132 07/13/20 0317 07/13/20 0318 07/13/20 1004 07/13/20 1224 07/13/20 1400 07/13/20 1702 07/14/20 0200 07/14/20 0826 07/14/20 1630  HGB 12.3*   < >  --    < >  --  11.8*   < >  --   --    < > 11.3* 10.1* 10.2*  HCT 35.0*   < >  --    < >  --  34.7*   < >  --   --    < > 33.2* 29.9* 29.5*  PLT 113*  --   --   --   --  119*  --   --   --   --  99*  --   --   APTT  --   --  26  --   --   --   --   --   --   --   --   --   --   LABPROT  --   --  13.8  --   --   --   --   --   --   --   --   --   --   INR  --   --  1.1  --   --   --   --   --   --   --   --   --   --   HEPARINUNFRC  --   --   --   --   --   --   --   --   --    < > 0.35 0.32 0.24*  CREATININE 2.85*  --   --   --  2.66*  --   --   --   --   --  2.85*  --   --   TROPONINIHS  --   --   --   --   --   --   --  32* 34*  --   --   --   --    < > = values in this interval not displayed.    Estimated Creatinine Clearance: 25.7 mL/min (A) (by C-G formula based on SCr of 2.85 mg/dL (H)).   Medical History: Past Medical History:  Diagnosis Date  . Atrial fibrillation ([place])   . BPH (benign prostatic hyperplasia)   . Cancer ([place])    liver  . Cirrhosis ([place])   . Diabetes mellitus without complication ([place])   . GERD (gastroesophageal reflux disease)   . Hepatitis C     Assessment: 70 y/o M with medical history including cirrhosis / liver cancer s/p liver transplant in 2018, pulmonary arterial hypertension, diabetes, Afib, BPH, substance abuse who is admitted with acute hypoxic  respiratory failure secondary to acute encephalopathy / possible CVA and hyperglycemic emergency / AKI. Pt not on anticoagulation prior to admission. reports of coffee ground emesis on 4/29 and has since resolved. Pharmacy has been consulted for heparin dosing and monitoring for afib. Hgb 12.1, Hct 35.2; aPTT/PT/INR wnl.  Dosing by wt of 77.6 kg  Date  Time HL Rate/Comment 5/2 1702 0.29 Slightly < goal; 1200>>1350 un/hr 5/2 0200 0.35 Therapeutic x 1; 1350 un/hr 5/3 0826 0.32 Therapeutic x2; 1350 un/hr 5/3 1430 0.24 Subtherapeutic; 1350>>  Goal of Therapy:  Heparin level 0.3-0.7 units/ml Monitor platelets by anticoagulation protocol: Yes   Plan:  Subtherapeutic HL @0 .24. Will bolus 1200 units x1; then increase heparin infusion to 1500 units/hr (~2u/k/h inc). H&H up, Platelets 119>99. Will recheck HL in 8 hrs after rate check; can switch to daily HL once consecutively therapeutic twice. Continue to monitor H&H and platelets  Lorna Dibble, PharmD 07/14/2020 5:01 PM

## 2020-07-14 NOTE — Progress Notes (Signed)
Patient extubated to 3L nasal cannula with no complications, per MD request.

## 2020-07-14 NOTE — Consult Note (Signed)
PHARMACY CONSULT NOTE  Pharmacy Consult for Electrolyte Monitoring and Replacement   Recent Labs: Potassium (mmol/L)  Date Value  07/14/2020 4.0   Magnesium (mg/dL)  Date Value  07/14/2020 2.3   Calcium (mg/dL)  Date Value  07/14/2020 7.7 (L)   Albumin (g/dL)  Date Value  07/10/2020 4.7   Phosphorus (mg/dL)  Date Value  07/14/2020 2.6   Sodium (mmol/L)  Date Value  07/14/2020 132 (L)   Assessment: Patient is a 70 y/o M with medical history including cirrhosis / liver cancer s/p liver transplant in 2018, pulmonary arterial hypertension, diabetes, Afib, BPH, substance abuse who is admitted with acute hypoxic respiratory failure secondary to acute encephalopathy / possible CVA and hyperglycemic emergency / AKI. Pharmacy has been consulted for electrolyte management.   Patient is currently intubated, sedated, and on mechanical ventilation in the ICU. 30% FiO2. Renal dysfunction persistent despite fluid resuscitation. Requiring Levophed for blood pressure support. Patient transitioned off of IV insulin to SQ insulin. Patient on amiodarone drip.   PROSource 21mL QID Vital High Protein @55mL /hr  Goal of Therapy:  K 4-5.1 mmol/L Mg 2-2.4 mg/dL All other electrolytes within normal limits  Plan:  --Na 132 - pt tube feeds were increased; will hold off on replacement for now  --Follow-up electrolytes with AM labs   Sherilyn Banker, PharmD Pharmacy Resident  07/14/2020 6:35 AM

## 2020-07-14 NOTE — Evaluation (Signed)
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 MRI negative for CVA  Clinical Impression  Patient received in bed, he is alert and agreeable to session. 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 He is grimacing with movement in bed. Patient requires mod assist +2 for bed mobility. Upon sitting up on edge of bed his BP was 84/63. Patient reported dizziness and seeing spots. Patient then returned to supine. Patient will continue to benefit from skilled PT while here to improve strength and functional independence for return to PFL.     Follow Up Recommendations SNF    Equipment Recommendations  Other (comment) (TBD)    Recommendations for Other Services       Precautions / Restrictions Precautions Precautions: Fall Precaution Comments: orthostatic hypotension Restrictions Weight Bearing Restrictions: No      Mobility  Bed Mobility Overal bed mobility: Needs Assistance Bed Mobility: Supine to Sit;Sit to Supine     Supine to sit: Mod assist;+2 for safety/equipment Sit to supine: Mod assist;+2 for safety/equipment   General bed mobility comments: mod multimodal cuing for hand placement and technique. Upon sitting edge of bed BP 84/63    Transfers                 General transfer comment: deferred secondary to hypotension  Ambulation/Gait                Stairs            Wheelchair Mobility    Modified Rankin (Stroke Patients Only)       Balance Overall balance assessment: Needs assistance Sitting-balance support: Feet supported Sitting balance-Leahy Scale: Fair Sitting balance - Comments: CGA for balance                                      Pertinent Vitals/Pain Pain Assessment: Faces Faces Pain Scale: Hurts even more Pain Location: lower back Pain Descriptors / Indicators: Aching;Discomfort Pain Intervention(s): Monitored during session;Repositioned    Home Living Family/patient expects to be discharged to:: Private residence Living Arrangements: Spouse/significant other Available Help at Discharge: Family;Available 24 hours/day Type of Home: House Home Access: Stairs to enter Entrance Stairs-Rails: Can reach both Entrance Stairs-Number of Steps: 7 Home Layout: One level Home Equipment: Clinical cytogeneticist - 2 wheels;Walker - 4 wheels      Prior Function Level of Independence: Independent         Comments: home equipment obtained from chart review from previous admission. Pt reports being independent without use of AD at baseline.     Hand Dominance   Dominant Hand: Right    Extremity/Trunk Assessment   Upper Extremity Assessment Upper Extremity Assessment: Defer to OT evaluation    Lower Extremity Assessment Lower Extremity Assessment: Generalized weakness       Communication   Communication: No difficulties;Other (comment) (speaks in whisper)  Cognition Arousal/Alertness: Awake/alert Behavior During Therapy: Flat affect Overall Cognitive Status: No family/caregiver present to determine baseline cognitive functioning  General Comments: Pt oriented to self and location. Pt very pleasant and cooperative but needing cuing to initiate and sequence tasks.      General Comments      Exercises     Assessment/Plan    PT Assessment Patient needs continued PT services  PT Problem List Decreased strength;Decreased mobility;Decreased activity tolerance;Decreased balance;Pain       PT Treatment Interventions DME instruction;Therapeutic exercise;Gait training;Balance training;Functional mobility training;Therapeutic activities;Patient/family  education    PT Goals (Current goals can be found in the Care Plan section)  Acute Rehab PT Goals Patient Stated Goal: to get better PT Goal Formulation: With patient Time For Goal Achievement: 07/28/20 Potential to Achieve Goals: Good    Frequency Min 2X/week   Barriers to discharge Inaccessible home environment      Co-evaluation PT/OT/SLP Co-Evaluation/Treatment: Yes Reason for Co-Treatment: For patient/therapist safety;To address functional/ADL transfers PT goals addressed during session: Mobility/safety with mobility OT goals addressed during session: ADL's and self-care       AM-PAC PT "6 Clicks" Mobility  Outcome Measure Help needed turning from your back to your side while in a flat bed without using bedrails?: A Lot Help needed moving from lying on your back to sitting on the side of a flat bed without using bedrails?: A Lot Help needed moving to and from a bed to a chair (including a wheelchair)?: Total Help needed standing up from a chair using your arms (e.g., wheelchair or bedside chair)?: Total Help needed to walk in hospital room?: Total Help needed climbing 3-5 steps with a railing? : Total 6 Click Score: 8    End of Session   Activity Tolerance: Patient limited by fatigue;Other (comment) (BP) Patient left: in bed;with call bell/phone within reach Nurse Communication: Mobility status PT Visit Diagnosis: Other abnormalities of gait and mobility (R26.89);Muscle weakness (generalized) (M62.81)    Time: 1610-9604 PT Time Calculation (min) (ACUTE ONLY): 20 min   Charges:   PT Evaluation $PT Eval Moderate Complexity: 1 Mod          Latessa Tillis, PT, GCS 07/14/20,3:15 PM

## 2020-07-14 NOTE — Progress Notes (Signed)
Progress Note  Patient Name: Hector Mueller Date of Encounter: 07/14/2020  [facility] Cardiologist: [facility]  Subjective   Patient extubated this morning.  He feels tired but denies chest pain.  He feels a little short of breath.  Inpatient Medications    Scheduled Meds: . Chlorhexidine Gluconate Cloth  6 each Topical QHS  . cycloSPORINE  200 mg Per Tube Daily   And  . cycloSPORINE  175 mg Per Tube QHS  . docusate  100 mg Per Tube BID  . mouth rinse  15 mL Mouth Rinse BID  . midodrine  5 mg Oral TID WC  . mycophenolate  500 mg Per Tube BID  . pantoprazole (PROTONIX) IV  40 mg Intravenous Q12H  . polyethylene glycol  17 g Per Tube Daily   Continuous Infusions: . sodium chloride 5 mL/hr at 07/14/20 1400  . amiodarone 30 mg/hr (07/14/20 1400)  . heparin 1,500 Units/hr (07/14/20 1723)  . insulin 4.2 Units/hr (07/14/20 1729)   PRN Meds: albuterol, docusate sodium, polyethylene glycol, polyvinyl alcohol   Vital Signs    Vitals:   07/14/20 1300 07/14/20 1400 07/14/20 1500 07/14/20 1600  BP: (!) 115/56 (!) 115/53 (!) 136/116 130/60  Pulse: 64 64 65 65  Resp: 19 18 17 15   Temp:    97.9 F (36.6 C)  TempSrc:    Oral  SpO2: 98% 98% 100% 96%  Weight:      Height:        Intake/Output Summary (Last 24 hours) at 07/14/2020 1733 Last data filed at 07/14/2020 1600 Gross per 24 hour  Intake 2558.51 ml  Output 920 ml  Net 1638.51 ml   Last 3 Weights 07/14/2020 07/13/2020 07/12/2020  Weight (lbs) 173 lb 1 oz 180 lb 8.9 oz 167 lb 1.7 oz  Weight (kg) 78.5 kg 81.9 kg 75.8 kg      Telemetry    Normal sinus rhythm with intermittent ventricular pacing - Personally Reviewed  ECG    No new tracing  Physical Exam   GEN: No acute distress.   Neck: No JVD Cardiac: RRR, no murmurs, rubs, or gallops.  Respiratory:  Bilateral rhonchi. GI: Soft, nontender, non-distended  MS:  Mild hand edema.  No lower extremity edema. Neuro:  Nonfocal  Psych: Normal affect   Labs    High  Sensitivity Troponin:   Recent Labs  Lab 07/13/20 1224 07/13/20 1400  TROPONINIHS 32* 34*      Chemistry Recent Labs  Lab 07/10/20 2109 07/11/20 0316 07/12/20 0140 07/13/20 0317 07/14/20 0200 07/14/20 1630  NA 133*   < > 137 133* 132*  --   K 4.9   < > 3.6 4.1 4.0 3.6  CL 81*   < > 99 99 100  --   CO2 25   < > 26 22 22   --   GLUCOSE 667*   < > 170* 292* 451*  --   BUN 53*   < > 46* 42* 46*  --   CREATININE 3.35*   < > 2.85* 2.66* 2.85*  --   CALCIUM 10.7*   < > 9.0 8.0* 7.7*  --   PROT 7.9  --   --   --   --   --   ALBUMIN 4.7  --   --   --   --   --   AST 14*  --   --   --   --   --   ALT 11  --   --   --   --   --  ALKPHOS 88  --   --   --   --   --   BILITOT 1.7*  --   --   --   --   --   GFRNONAA 19*   < > 23* 25* 23*  --   ANIONGAP 27*   < > 12 12 10   --    < > = values in this interval not displayed.     Hematology Recent Labs  Lab 07/12/20 0140 07/12/20 0433 07/13/20 0318 07/13/20 1004 07/14/20 0200 07/14/20 0826 07/14/20 1630  WBC 11.9*  --  6.6  --  7.2  --   --   RBC 4.02*  --  3.88*  --  3.71*  --   --   HGB 12.3*   < > 11.8*   < > 11.3* 10.1* 10.2*  HCT 35.0*   < > 34.7*   < > 33.2* 29.9* 29.5*  MCV 87.1  --  89.4  --  89.5  --   --   MCH 30.6  --  30.4  --  30.5  --   --   MCHC 35.1  --  34.0  --  34.0  --   --   RDW 12.5  --  12.8  --  13.2  --   --   PLT 113*  --  119*  --  99*  --   --    < > = values in this interval not displayed.    BNPNo results for input(s): BNP, PROBNP in the last 168 hours.   DDimer No results for input(s): DDIMER in the last 168 hours.   Radiology    CT HEAD WO CONTRAST  Result Date: 07/13/2020 CLINICAL DATA:  Follow-up stroke EXAM: CT HEAD WITHOUT CONTRAST TECHNIQUE: Contiguous axial images were obtained from the base of the skull through the vertex without intravenous contrast. COMPARISON:  CT studies 07/10/2020 FINDINGS: Brain: Age related brain volume loss. No focal abnormality affects the brainstem or  cerebellum. Cerebral hemispheres show chronic small-vessel ischemic changes of the white matter. 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 No sign of acute infarction, mass lesion, hemorrhage, hydrocephalus or extra-axial collection. Vascular: There is atherosclerotic calcification of the major vessels at the base of the brain. Skull: Negative Sinuses/Orbits: Scattered mild inflammatory changes of the paranasal sinuses. No advanced finding. Orbits negative. Other: None IMPRESSION: No acute finding by CT. Age related atrophy. Chronic small-vessel ischemic changes of the white matter. Electronically Signed   By: Nelson Chimes M.D.   On: 07/13/2020 15:33   ECHOCARDIOGRAM COMPLETE  Result Date: 07/13/2020    ECHOCARDIOGRAM REPORT   Patient Name:   Hector Mueller Date of Exam: 07/13/2020 Medical Rec #:  [id number]      Height:       72.0 in Accession #:    [id number]     Weight:       180.6 lb Date of Birth:  17-Sep-1950       BSA:          2.040 m Patient Age:    70 years       BP:           90/52 mmHg Patient Gender: M              HR:           31 bpm. Exam Location:  [facility] Procedure: 2D Echo, Cardiac Doppler and Color Doppler Indications:     Atrial Fibrillation  I48.91  History:         Patient has no prior history of Echocardiogram examinations.                  Arrythmias:Atrial Fibrillation; Risk Factors:Diabetes.  Sonographer:     Sherrie Sport RDCS (AE) Referring Phys:  [id number] Alsie Younes Diagnosing Phys: Kate Sable MD  Sonographer Comments: Echo performed with patient supine and on artificial respirator. Image acquisition challenging due to respiratory motion. IMPRESSIONS  1. Left ventricular ejection fraction, by estimation, is 40 to 45%. The left ventricle has mild to moderately decreased function. The left ventricle demonstrates global hypokinesis. Left ventricular diastolic parameters are consistent with Grade I diastolic dysfunction (impaired relaxation).  2. Right ventricular systolic function  is normal. The right ventricular size is normal.  3. The mitral valve is grossly normal. No evidence of mitral valve regurgitation.  4. The aortic valve was not well visualized. Aortic valve regurgitation is not visualized.  5. Pulmonic valve regurgitation not assessed. FINDINGS  Left Ventricle: Left ventricular ejection fraction, by estimation, is 40 to 45%. The left ventricle has mild to moderately decreased function. The left ventricle demonstrates global hypokinesis. The left ventricular internal cavity size was normal in size. There is no left ventricular hypertrophy. Left ventricular diastolic parameters are consistent with Grade I diastolic dysfunction (impaired relaxation). Right Ventricle: The right ventricular size is normal. No increase in right ventricular wall thickness. Right ventricular systolic function is normal. Left Atrium: Left atrial size was normal in size. Right Atrium: Right atrial size was normal in size. Pericardium: There is no evidence of pericardial effusion. Mitral Valve: The mitral valve is grossly normal. No evidence of mitral valve regurgitation. Tricuspid Valve: The tricuspid valve is grossly normal. Tricuspid valve regurgitation is not demonstrated. Aortic Valve: The aortic valve was not well visualized. Aortic valve regurgitation is not visualized. Aortic valve mean gradient measures 3.0 mmHg. Aortic valve peak gradient measures 5.2 mmHg. Aortic valve area, by VTI measures 2.29 cm. Pulmonic Valve: The pulmonic valve was not well visualized. Pulmonic valve regurgitation not assessed. Aorta: The aortic root was not well visualized. IAS/Shunts: No atrial level shunt detected by color flow Doppler.  LEFT VENTRICLE PLAX 2D LVIDd:         3.32 cm  Diastology LVIDs:         2.44 cm  LV e' medial:    5.33 cm/s LV PW:         1.08 cm  LV E/e' medial:  11.7 LV IVS:        0.97 cm  LV e' lateral:   6.96 cm/s LVOT diam:     2.00 cm  LV E/e' lateral: 9.0 LV SV:         44 LV SV Index:   21  LVOT Area:     3.14 cm  RIGHT VENTRICLE RV S prime:     8.81 cm/s TAPSE (M-mode): 2.4 cm LEFT ATRIUM             Index       RIGHT ATRIUM           Index LA diam:        3.60 cm 1.76 cm/m  RA Area:     12.30 cm LA Vol (A2C):   23.1 ml 11.32 ml/m RA Volume:   28.50 ml  13.97 ml/m LA Vol (A4C):   28.4 ml 13.92 ml/m LA Biplane Vol: 27.6 ml 13.53 ml/m  AORTIC VALVE AV Area (Vmax):  2.27 cm AV Area (Vmean):   2.04 cm AV Area (VTI):     2.29 cm AV Vmax:           114.00 cm/s AV Vmean:          78.250 cm/s AV VTI:            0.191 m AV Peak Grad:      5.2 mmHg AV Mean Grad:      3.0 mmHg LVOT Vmax:         82.20 cm/s LVOT Vmean:        50.800 cm/s LVOT VTI:          0.139 m LVOT/AV VTI ratio: 0.73 MITRAL VALVE               TRICUSPID VALVE MV Area (PHT): 3.08 cm    TR Peak grad:   22.1 mmHg MV Decel Time: 246 msec    TR Vmax:        235.00 cm/s MV E velocity: 62.35 cm/s MV A velocity: 90.40 cm/s  SHUNTS MV E/A ratio:  0.69        Systemic VTI:  0.14 m                            Systemic Diam: 2.00 cm Kate Sable MD Electronically signed by Kate Sable MD Signature Date/Time: 07/13/2020/1:36:20 PM    Final     Cardiac Studies   TTE (07/13/2020): 1. Left ventricular ejection fraction, by estimation, is 40 to 45%. The  left ventricle has mild to moderately decreased function. The left  ventricle demonstrates global hypokinesis. Left ventricular diastolic  parameters are consistent with Grade I  diastolic dysfunction (impaired relaxation).  2. Right ventricular systolic function is normal. The right ventricular  size is normal.  3. The mitral valve is grossly normal. No evidence of mitral valve  regurgitation.  4. The aortic valve was not well visualized. Aortic valve regurgitation  is not visualized.  5. Pulmonic valve regurgitation not assessed.   Patient Profile     70 y.o. male with a hx of sick sinus syndrome s/p Micra pacemaker (09/2016), persistent atrial fibrillation  previously on amiodarone and warfarin but stopped by EP team at [facility] in 2019 in setting of no recurrent a-fib and CHADSVASC score of 1, pulmonary hypertension, cirrhosis s/p liver transplant, type 2 diabetes mellitus, and hyperlipidemia, admitted with altered mental status in the setting of HHS and possible stroke.  Hospitalization has been complicated by atrial fibrillation with rapid ventricular response and shock requiring emergent cardioversion.  Assessment & Plan    Atrial fibrillation with rapid ventricular response: Mr. Gladu is maintaining sinus rhythm following emergent cardioversion yesterday, remaining on amiodarone.  Echo yesterday showed mildly to moderately reduced LVEF.  Hemoglobin relatively stable.  Pacemaker interrogation yesterday not ideal to assess for history of atrial fibrillation given that it is not RV only device.  Heart rate trends do not suggest significant atrial fibrillation burden.  Continue IV amiodarone for 1 more day; hopefully transition to oral amiodarone tomorrow.  Continue IV heparin.  Given history of liver disease and prior anticoagulation with warfarin, I suspect this would be preferable over NOAC.  Can be readdressed with patient prior to discharge.  Cardiomyopathy: LVEF mildly to moderately reduced by echo with global hypokinesis.  Hypotension has resolved.  Add metoprolol succinate 12.5 mg daily.  Bradycardia should not be a concern given indwelling pacemaker.  Defer  ACE inhibitor/ARB and aldosterone antagonist in the setting of AKI.  Defer ischemia evaluation in the setting of AKI and recovering mental status.  AKI:  Avoid nephrotoxic drugs.  Further work-up per CCM.  Altered mental status: Initial concern for stroke versus HES.  Patient now successfully extubated without focal neurologic complaints.  Head CT without acute stroke.  Per CCM.  For questions or updates, please contact [place] Please consult [email or web address] for contact  info under [facility].     Signed, Nelva Bush, MD  07/14/2020, 5:33 PM

## 2020-07-15 ENCOUNTER — Inpatient Hospital Stay: Payer: Medicare Other

## 2020-07-15 DIAGNOSIS — I4891 Unspecified atrial fibrillation: Secondary | ICD-10-CM

## 2020-07-15 DIAGNOSIS — J9601 Acute respiratory failure with hypoxia: Secondary | ICD-10-CM | POA: Diagnosis not present

## 2020-07-15 DIAGNOSIS — I5020 Unspecified systolic (congestive) heart failure: Secondary | ICD-10-CM

## 2020-07-15 LAB — HEPARIN LEVEL (UNFRACTIONATED)
Heparin Unfractionated: 0.36 IU/mL (ref 0.30–0.70)
Heparin Unfractionated: 0.19 IU/mL — ABNORMAL LOW (ref 0.30–0.70)

## 2020-07-15 LAB — GLUCOSE, CAPILLARY
Glucose-Capillary: 131 mg/dL — ABNORMAL HIGH (ref 70–99)
Glucose-Capillary: 105 mg/dL — ABNORMAL HIGH (ref 70–99)
Glucose-Capillary: 117 mg/dL — ABNORMAL HIGH (ref 70–99)
Glucose-Capillary: 249 mg/dL — ABNORMAL HIGH (ref 70–99)
Glucose-Capillary: 211 mg/dL — ABNORMAL HIGH (ref 70–99)
Glucose-Capillary: 106 mg/dL — ABNORMAL HIGH (ref 70–99)

## 2020-07-15 LAB — BASIC METABOLIC PANEL
Anion gap: 8 (ref 5–15)
BUN: 46 mg/dL — ABNORMAL HIGH (ref 8–23)
CO2: 24 mmol/L (ref 22–32)
Calcium: 8.4 mg/dL — ABNORMAL LOW (ref 8.9–10.3)
Chloride: 106 mmol/L (ref 98–111)
Creatinine, Ser: 2.6 mg/dL — ABNORMAL HIGH (ref 0.61–1.24)
GFR, Estimated: 25 mL/min — ABNORMAL LOW (ref >60)
Glucose, Bld: 123 mg/dL — ABNORMAL HIGH (ref 70–99)
Potassium: 3.2 mmol/L — ABNORMAL LOW (ref 3.5–5.1)
Sodium: 138 mmol/L (ref 135–145)

## 2020-07-15 LAB — CBC
HCT: 29.5 % — ABNORMAL LOW (ref 39.0–52.0)
Hemoglobin: 10.4 g/dL — ABNORMAL LOW (ref 13.0–17.0)
MCH: 30.9 pg (ref 26.0–34.0)
MCHC: 35.3 g/dL (ref 30.0–36.0)
MCV: 87.5 fL (ref 80.0–100.0)
Platelets: 94 10*3/uL — ABNORMAL LOW (ref 150–400)
RBC: 3.37 MIL/uL — ABNORMAL LOW (ref 4.22–5.81)
RDW: 13.1 % (ref 11.5–15.5)
WBC: 4.8 10*3/uL (ref 4.0–10.5)
nRBC: 0 % (ref 0.0–0.2)

## 2020-07-15 LAB — CYCLOSPORINE: Cyclosporine, LabCorp: 95 ng/mL — ABNORMAL LOW (ref 100–400)

## 2020-07-15 LAB — MAGNESIUM: Magnesium: 2.1 mg/dL (ref 1.7–2.4)

## 2020-07-15 LAB — PHOSPHORUS: Phosphorus: 2.3 mg/dL — ABNORMAL LOW (ref 2.5–4.6)

## 2020-07-15 MED ORDER — ADULT MULTIVITAMIN W/MINERALS CH
1.0000 | ORAL_TABLET | Freq: Every day | ORAL | Status: DC
  Administered 2020-07-15 – 2020-07-20 (×6): 1 via ORAL
  Filled 2020-07-15 (×6): qty 1

## 2020-07-15 MED ORDER — ONDANSETRON HCL 4 MG/2ML IJ SOLN
4.0000 mg | Freq: Four times a day (QID) | INTRAMUSCULAR | Status: DC | PRN
  Administered 2020-07-18: 4 mg via INTRAVENOUS
  Filled 2020-07-15 (×2): qty 2

## 2020-07-15 MED ORDER — WARFARIN - PHARMACIST DOSING INPATIENT: Freq: Every day | Status: DC

## 2020-07-15 MED ORDER — K PHOS MONO-SOD PHOS DI & MONO 155-852-130 MG PO TABS
500.0000 mg | ORAL_TABLET | ORAL | Status: DC
  Filled 2020-07-15 (×2): qty 2

## 2020-07-15 MED ORDER — MYCOPHENOLATE MOFETIL HCL 500 MG IV SOLR
500.0000 mg | Freq: Two times a day (BID) | INTRAVENOUS | Status: DC
  Filled 2020-07-15 (×2): qty 15

## 2020-07-15 MED ORDER — POTASSIUM CHLORIDE 10 MEQ/100ML IV SOLN
10.0000 meq | INTRAVENOUS | Status: AC
  Administered 2020-07-15 (×5): 10 meq via INTRAVENOUS
  Filled 2020-07-15 (×5): qty 100

## 2020-07-15 MED ORDER — POTASSIUM PHOSPHATES 15 MMOLE/5ML IV SOLN
10.0000 mmol | Freq: Once | INTRAVENOUS | Status: DC
  Filled 2020-07-15: qty 3.33

## 2020-07-15 MED ORDER — DULOXETINE HCL 30 MG PO CPEP
30.0000 mg | ORAL_CAPSULE | Freq: Every day | ORAL | Status: DC
  Administered 2020-07-16 – 2020-07-20 (×5): 30 mg via ORAL
  Filled 2020-07-15 (×5): qty 1

## 2020-07-15 MED ORDER — ENSURE ENLIVE PO LIQD
237.0000 mL | Freq: Two times a day (BID) | ORAL | Status: DC
  Administered 2020-07-15 – 2020-07-20 (×6): 237 mL via ORAL

## 2020-07-15 MED ORDER — QUETIAPINE FUMARATE 25 MG PO TABS
25.0000 mg | ORAL_TABLET | Freq: Every day | ORAL | Status: DC
  Administered 2020-07-15 – 2020-07-19 (×5): 25 mg via ORAL
  Filled 2020-07-15 (×5): qty 1

## 2020-07-15 MED ORDER — TRAZODONE HCL 50 MG PO TABS
50.0000 mg | ORAL_TABLET | Freq: Every evening | ORAL | Status: DC | PRN
  Administered 2020-07-15 – 2020-07-19 (×4): 50 mg via ORAL
  Filled 2020-07-15 (×4): qty 1

## 2020-07-15 MED ORDER — CYCLOSPORINE MODIFIED (NEORAL) 25 MG PO CAPS
200.0000 mg | ORAL_CAPSULE | Freq: Every day | ORAL | Status: DC
  Administered 2020-07-15 – 2020-07-20 (×6): 200 mg via ORAL
  Filled 2020-07-15 (×7): qty 8

## 2020-07-15 MED ORDER — ROSUVASTATIN CALCIUM 5 MG PO TABS
5.0000 mg | ORAL_TABLET | Freq: Every evening | ORAL | Status: DC
  Administered 2020-07-15 – 2020-07-19 (×5): 5 mg via ORAL
  Filled 2020-07-15 (×5): qty 1

## 2020-07-15 MED ORDER — ACETAMINOPHEN 10 MG/ML IV SOLN
1000.0000 mg | Freq: Four times a day (QID) | INTRAVENOUS | Status: AC | PRN
  Administered 2020-07-15: 1000 mg via INTRAVENOUS
  Filled 2020-07-15: qty 100

## 2020-07-15 MED ORDER — CYCLOSPORINE MODIFIED (GENGRAF) 25 MG PO CAPS
175.0000 mg | ORAL_CAPSULE | Freq: Every day | ORAL | Status: DC
  Administered 2020-07-15 – 2020-07-19 (×5): 175 mg via ORAL
  Filled 2020-07-15 (×8): qty 7

## 2020-07-15 MED ORDER — MYCOPHENOLATE MOFETIL 250 MG PO CAPS
500.0000 mg | ORAL_CAPSULE | Freq: Two times a day (BID) | ORAL | Status: DC
  Administered 2020-07-15 – 2020-07-20 (×11): 500 mg via ORAL
  Filled 2020-07-15 (×12): qty 2

## 2020-07-15 MED ORDER — MIRTAZAPINE 15 MG PO TABS
15.0000 mg | ORAL_TABLET | Freq: Every day | ORAL | Status: DC
  Administered 2020-07-15 – 2020-07-19 (×5): 15 mg via ORAL
  Filled 2020-07-15 (×5): qty 1

## 2020-07-15 MED ORDER — AMIODARONE HCL 200 MG PO TABS
400.0000 mg | ORAL_TABLET | Freq: Two times a day (BID) | ORAL | Status: DC
  Administered 2020-07-15 – 2020-07-20 (×11): 400 mg via ORAL
  Filled 2020-07-15 (×11): qty 2

## 2020-07-15 MED ORDER — WARFARIN SODIUM 7.5 MG PO TABS
7.5000 mg | ORAL_TABLET | Freq: Once | ORAL | Status: AC
  Administered 2020-07-15: 7.5 mg via ORAL
  Filled 2020-07-15: qty 1

## 2020-07-15 MED ORDER — K PHOS MONO-SOD PHOS DI & MONO 155-852-130 MG PO TABS
500.0000 mg | ORAL_TABLET | ORAL | Status: AC
  Administered 2020-07-15 (×2): 500 mg via ORAL
  Filled 2020-07-15 (×2): qty 2

## 2020-07-15 NOTE — Progress Notes (Signed)
ANTICOAGULATION CONSULT NOTE  Pharmacy Consult for heparin infusion Indication: atrial fibrillation  No Known Allergies  Patient Measurements: Height: 6' (182.9 cm) Weight: 78.5 kg (173 lb 1 oz) IBW/kg (Calculated) : 77.6  Vital Signs: Temp: 98.7 F (37.1 C) (05/03 2000) Temp Source: Oral (05/03 2000) BP: 123/64 (05/04 0300) Pulse Rate: 76 (05/04 0300)  Labs: Recent Labs    07/12/20 0932 07/12/20 1132 07/13/20 0317 07/13/20 0318 07/13/20 1004 07/13/20 1224 07/13/20 1400 07/13/20 1702 07/14/20 0200 07/14/20 0826 07/14/20 1630 07/14/20 2334 07/15/20 0203  HGB  --    < >  --  11.8*   < >  --   --    < > 11.3* 10.1* 10.2* 9.9*  --   HCT  --    < >  --  34.7*   < >  --   --    < > 33.2* 29.9* 29.5* 28.6*  --   PLT  --   --   --  119*  --   --   --   --  99*  --   --   --   --   APTT 26  --   --   --   --   --   --   --   --   --   --   --   --   LABPROT 13.8  --   --   --   --   --   --   --   --   --   --   --   --   INR 1.1  --   --   --   --   --   --   --   --   --   --   --   --   HEPARINUNFRC  --   --   --   --   --   --   --    < > 0.35 0.32 0.24*  --  0.36  CREATININE  --   --  2.66*  --   --   --   --   --  2.85*  --   --   --   --   TROPONINIHS  --   --   --   --   --  32* 34*  --   --   --   --   --   --    < > = values in this interval not displayed.    Estimated Creatinine Clearance: 25.7 mL/min (A) (by C-G formula based on SCr of 2.85 mg/dL (H)).   Medical History: Past Medical History:  Diagnosis Date  . Atrial fibrillation ([place])   . BPH (benign prostatic hyperplasia)   . Cancer ([place])    liver  . Cirrhosis ([place])   . Diabetes mellitus without complication ([place])   . GERD (gastroesophageal reflux disease)   . Hepatitis C     Assessment: 70 y/o M with medical history including cirrhosis / liver cancer s/p liver transplant in 2018, pulmonary arterial hypertension, diabetes, Afib, BPH, substance abuse who is admitted with acute hypoxic respiratory  failure secondary to acute encephalopathy / possible CVA and hyperglycemic emergency / AKI. Pt not on anticoagulation prior to admission. reports of coffee ground emesis on 4/29 and has since resolved. Pharmacy has been consulted for heparin dosing and monitoring for afib. Hgb 12.1, Hct 35.2; aPTT/PT/INR wnl.  Dosing by wt of 77.6 kg  Date  Time HL Rate/Comment 5/2 1702 0.29 Slightly < goal; 1200>>1350 un/hr 5/2 0200 0.35 Therapeutic x 1; 1350 un/hr 5/3 0826 0.32 Therapeutic x2; 1350 un/hr 5/3 1430 0.24 Subtherapeutic; 1350>> 5/4 0203 0.36 Therapeutic x 1  Goal of Therapy:  Heparin level 0.3-0.7 units/ml Monitor platelets by anticoagulation protocol: Yes   Plan:  Continue heparin infusion at 1500 units/hr (~2u/k/h inc). Will recheck HL in 8 hrs to confirm.  Can switch to daily HL once consecutively therapeutic twice. Continue to monitor H&H and platelets  Renda Rolls, PharmD, [facility] 07/15/2020 4:11 AM

## 2020-07-15 NOTE — Progress Notes (Signed)
Pt awake most of night. Pt c/o back pain, IV tylenol given, some relief from medication. Pt remains NPO. Vitals stable throughout night.

## 2020-07-15 NOTE — Plan of Care (Signed)
Neuro: remains slightly confused, hallucinating and not sleeping, MD aware Resp: stable on room air CV: afebrile, vital signs stable GIGU: now tolerating PO following speech therapy eval, foley in place, no BM Skin: clean, dry and intact Social: Wife visited today, questions and concerns addressed.   Patient shared some intimate memories of being drafted and serving in the [place] War. He stated, "I've never talked with this with anyone in my life." He stated he did not know why he was sharing but it felt right. He said he has done everything he has wanted to do in his life so he just wants to get it out of his head. He would pause throughout the conversation, appearing to relive memories.  He became tearful throughout the conversation.   Problem: Education: Goal: Knowledge of General Education information will improve Description: Including pain rating scale, medication(s)/side effects and non-pharmacologic comfort measures Outcome: Progressing   Problem: Health Behavior/Discharge Planning: Goal: Ability to manage health-related needs will improve Outcome: Progressing   Problem: Clinical Measurements: Goal: Ability to maintain clinical measurements within normal limits will improve Outcome: Progressing Goal: Will remain free from infection Outcome: Progressing Goal: Diagnostic test results will improve Outcome: Progressing Goal: Respiratory complications will improve Outcome: Progressing Goal: Cardiovascular complication will be avoided Outcome: Progressing   Problem: Activity: Goal: Risk for activity intolerance will decrease Outcome: Progressing   Problem: Nutrition: Goal: Adequate nutrition will be maintained Outcome: Progressing   Problem: Coping: Goal: Level of anxiety will decrease Outcome: Progressing   Problem: Elimination: Goal: Will not experience complications related to bowel motility Outcome: Progressing Goal: Will not experience complications related to  urinary retention Outcome: Progressing   Problem: Pain Managment: Goal: General experience of comfort will improve Outcome: Progressing   Problem: Safety: Goal: Ability to remain free from injury will improve Outcome: Progressing   Problem: Skin Integrity: Goal: Risk for impaired skin integrity will decrease Outcome: Progressing   Problem: Education: Goal: Ability to describe self-care measures that may prevent or decrease complications (Diabetes Survival Skills Education) will improve Outcome: Progressing Goal: Individualized Educational Video(s) Outcome: Progressing   Problem: Health Behavior/Discharge Planning: Goal: Ability to identify and utilize available resources and services will improve Outcome: Progressing Goal: Ability to manage health-related needs will improve Outcome: Progressing   Problem: Fluid Volume: Goal: Ability to achieve a balanced intake and output will improve Outcome: Progressing   Problem: Metabolic: Goal: Ability to maintain appropriate glucose levels will improve Outcome: Progressing   Problem: Nutritional: Goal: Maintenance of adequate nutrition will improve Outcome: Progressing Goal: Maintenance of adequate weight for body size and type will improve Outcome: Progressing   Problem: Urinary Elimination: Goal: Ability to achieve and maintain adequate renal perfusion and functioning will improve Outcome: Progressing

## 2020-07-15 NOTE — Consult Note (Addendum)
PHARMACY CONSULT NOTE  Pharmacy Consult for Electrolyte Monitoring and Replacement   Recent Labs: Potassium (mmol/L)  Date Value  07/15/2020 3.2 (L)   Magnesium (mg/dL)  Date Value  07/15/2020 2.1   Calcium (mg/dL)  Date Value  07/15/2020 8.4 (L)   Albumin (g/dL)  Date Value  07/10/2020 4.7   Phosphorus (mg/dL)  Date Value  07/15/2020 2.3 (L)   Sodium (mmol/L)  Date Value  07/15/2020 138   Assessment: Patient is a 70 y/o M with medical history including cirrhosis / liver cancer s/p liver transplant in 2018, pulmonary arterial hypertension, diabetes, Afib, BPH, substance abuse who is admitted with acute hypoxic respiratory failure secondary to acute encephalopathy / possible CVA and hyperglycemic emergency / AKI. Pharmacy has been consulted for electrolyte management.   Patient is currently intubated, sedated, and on mechanical ventilation in the ICU. 30% FiO2. Renal dysfunction persistent despite fluid resuscitation. Requiring Levophed for blood pressure support. Patient transitioned off of IV insulin to SQ insulin. Patient on amiodarone drip.   PROSource 44mL QID Vital High Protein @55mL /hr  Goal of Therapy:  K 4-5.1 mmol/L Mg 2-2.4 mg/dL All other electrolytes within normal limits  Plan:  --K 3.2 - will replace with IV Kcl 66mEq x 5 and recheck K at 1700 --Phos 2.3 - pt unable to take PO, will replace with 60mmol IV Kphos **nurse informed me pt was able to take PO, orders were changed back to Kphos tabs 500mg  q4h x2** --Follow-up electrolytes with AM labs   Sherilyn Banker, PharmD Pharmacy Resident  07/15/2020 6:37 AM

## 2020-07-15 NOTE — Evaluation (Signed)
Clinical/Bedside Swallow Evaluation Patient Details  Name: Hector Mueller MRN: [id number] Date of Birth: 02-Oct-1950  Today's Date: 07/15/2020 Time: SLP Start Time (ACUTE ONLY): 1000 SLP Stop Time (ACUTE ONLY): 1020 SLP Time Calculation (min) (ACUTE ONLY): 20 min  Past Medical History:  Past Medical History:  Diagnosis Date  . Atrial fibrillation ([place])   . BPH (benign prostatic hyperplasia)   . Cancer ([place])    liver  . Cirrhosis ([place])   . Diabetes mellitus without complication ([place])   . GERD (gastroesophageal reflux disease)   . Hepatitis C    Past Surgical History:  Past Surgical History:  Procedure Laterality Date  . ANKLE FRACTURE SURGERY Bilateral   . BACK SURGERY    . CLAVICLE SURGERY    . INTRAMEDULLARY (IM) NAIL INTERTROCHANTERIC Right 11/28/2017   Procedure: INTRAMEDULLARY (IM) NAIL INTERTROCHANTRIC;  Surgeon: Earnestine Leys, MD;  Location: [facility] ORS;  Service: Orthopedics;  Laterality: Right;  . LIVER SURGERY     HPI:  Patient is 38yoM with hx of DM, HTN, HLD, s/p liver transplant a-fib off AC who was found unresponsive in setting of hyperglycemia.Pt on vent support from 4/29 thru 07/14/2020. Chest x-ray today reveals Increasingly patchy and coalescent opacities in the mid to lower lungs. Could reflect developing airspace disease, worsening  atelectasis or edema.   Assessment / Plan / Recommendation Clinical Impression  Pt presents with adequate oropharyngeal abilities when consuming puree, dysphagia 3 and thin liquids via cup and straw. When consuming POs, he demonstrates an effective oral phase that is c/b functional mastication and complete oral clearing. Although pt is dysphonic, he is free of any overt s/s of pharyngeal phase dysphagia. He consumed thin liquids with the appearance of a swift swallow, his vitals remained stable and his vocal quality remained clear. At this time, pt is appropriate for a dysphagia 3 diet and thin liquids with medicine whole in puree. ST to  follow in the next several days for possible diet advancement. SLP Visit Diagnosis: Dysphagia, unspecified (R13.10)    Aspiration Risk  Mild aspiration risk    Diet Recommendation Dysphagia 3 (Mech soft);Thin liquid   Liquid Administration via: Cup;Straw Medication Administration: Whole meds with puree Supervision: Patient able to self feed Compensations: Minimize environmental distractions;Slow rate;Small sips/bites Postural Changes: Seated upright at 90 degrees    Other  Recommendations Oral Care Recommendations: Oral care BID   Follow up Recommendations None      Frequency and Duration min 2x/week  2 weeks       Prognosis Prognosis for Safe Diet Advancement: Good      Swallow Study   General Date of Onset: 07/11/20 HPI: Patient is 44yoM with hx of DM, HTN, HLD, s/p liver transplant a-fib off AC who was found unresponsive in setting of hyperglycemia.Pt on vent support from 4/29 thru 07/14/2020. Chest x-ray today reveals Increasingly patchy and coalescent opacities in the mid to lower lungs. Could reflect developing airspace disease, worsening  atelectasis or edema. Type of Study: Bedside Swallow Evaluation Previous Swallow Assessment: none in chart Diet Prior to this Study: NPO Temperature Spikes Noted: No Respiratory Status: Nasal cannula History of Recent Intubation: Yes Length of Intubations (days): 4 days Date extubated: 07/14/20 Behavior/Cognition: Alert;Cooperative;Pleasant mood Oral Cavity Assessment: Within Functional Limits Oral Care Completed by SLP: Recent completion by staff Oral Cavity - Dentition: Adequate natural dentition Vision: Functional for self-feeding Self-Feeding Abilities: Needs assist Patient Positioning: Upright in bed Baseline Vocal Quality: Low vocal intensity;Hoarse Volitional Cough: Strong Volitional Swallow: Able to elicit  Oral/Motor/Sensory Function Overall Oral Motor/Sensory Function: Within functional limits   Ice Chips Ice  chips: Within functional limits Presentation: Spoon   Thin Liquid Thin Liquid: Within functional limits Presentation: Cup;Straw;Self Fed    Nectar Thick Nectar Thick Liquid: Not tested   Honey Thick Honey Thick Liquid: Not tested   Puree Puree: Within functional limits Presentation: Self Fed;Spoon   Solid     Solid: Within functional limits Presentation: Self Fed Other Comments: dysphagia 3 diet textures     Kye Hedden B. Rutherford Nail M.S., CCC-SLP, [place] Office [phone]  Takasha Vetere Rutherford Nail 07/15/2020,3:47 PM

## 2020-07-15 NOTE — Progress Notes (Addendum)
PROGRESS NOTE    Hector Mueller  CHY:[id number] DOB: 06-02-1950 DOA: 07/10/2020 PCP: [name], MD   Brief Narrative:  70 year old male who was brought to the [facility] ED due to altered mental status.  Per ED documentation the patient was reportedly in his usual state of health last night on 07/09/20 until about 11:30 PM.  This morning the patient was noted to have difficulty walking, but was talking and interacting normally with family.  However today at noon he was found to be unable to stand or move purposefully as well as being unable to speak. Extubated successfully on 5/3.  Mental status returning to baseline on 5/4.  Patient status post DC cardioversion on 5/2 with successful restoration of sinus rhythm.  Seen by cardiology and converted to p.o. amiodarone.  Seen by SLP and diet advanced as well.   Assessment & Plan:   Active Problems:   Acute respiratory failure (HCC)   Atrial fibrillation (HCC)   HFrEF (heart failure with reduced ejection fraction) (HCC)  Acute Hypoxic Respiratory Failure secondary to acute encephalopathy in the setting of HHS Acute stroke was ruled out Acute encephalopathy, multifactorial. metabolic/ suspected CVA Poorly controlled diabetes type 2 presented with HHS and now again with hyperglycemia CT imaging survey negative for acute stroke/LVO.  MRI not possible due to pacer.  Mental status back at baseline.  Tolerated extubation well.  Suspected etiology of encephalopathy and airway compromise due to HHS. Plan: Diabetes coordinator for insulin management Oxygen as necessary Monitor vitals Transfer to PCU  Paroxysmal A. fib status post cardioversion Status post DC cardioversion on 5/2.  Tolerated well.  Seen by cardiology this morning.  Transition to p.o. amiodarone. Plan: Warfarin per pharmacy protocol with heparin bridge Avoid NOAC 2 history liver disease/transplant  Acute systolic congestive heart failure Echocardiogram was done which showed EF  40 to 45% He will need to be started on beta-blocker/ACE once blood pressure improves Defer to cardiology regarding ischemic work-up.  Could be deferred outpatient  Acute Kidney Injury on CKD Baseline Cr: 1.6, Cr on admission: 3.35, likely prerenal Suspect prerenal azotemia in the setting of respiratory compromise.  Creatinine improving.  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 No more coffee-ground emesis H&H remained stable Continue cyclosporine and mycophenolate at home Follow cyclosporine level Monitor for signs and symptoms of bleeding Transfuse if hemoglobin less than 7  Pulmonary arterial hypertension Who class I Hold sildenafil due to hypotension Continuous cardiac monitoring   DVT prophylaxis: Heparin GTT Code Status: Full Family Communication: Spouse at bedside Disposition Plan: Status is: Inpatient  Remains inpatient appropriate because:Inpatient level of care appropriate due to severity of illness   Dispo: The patient is from: Home              Anticipated d/c is to: SNF versus home with home health              Patient currently is not medically stable to d/c.   Difficult to place patient No  Resolving respiratory failure.  Atrial fibrillation now under better control.  Hemodynamically stable.  Transfer to PCU.     Level of care: Palliative Care  Consultants:   Cardiology  Procedures:   DC cardioversion  Endotracheal intubation  Antimicrobials:   None   Subjective: Seen and examined.  Sitting up in bed.  Visibly no distress.  No pain complaints  Objective: Vitals:   07/15/20 0300 07/15/20 0400 07/15/20 0500  07/15/20 0600  BP: 123/64 (!) 146/67 137/61 (!) 138/58  Pulse: 76 78 77 73  Resp: 13 13 15 10   Temp:  99.5 F (37.5 C)    TempSrc:  Oral    SpO2: 99% 99% 99% 98%  Weight:      Height:        Intake/Output Summary (Last 24 hours) at 07/15/2020  1550 Last data filed at 07/15/2020 1347 Gross per 24 hour  Intake 1409.47 ml  Output 780 ml  Net 629.47 ml   Filed Weights   07/12/20 0500 07/13/20 0500 07/14/20 0500  Weight: 75.8 kg 81.9 kg 78.5 kg    Examination:  General exam: No acute distress.  Appears fatigued.  Hoarse voice Respiratory system: Mild scattered crackles bilaterally.  Normal work of breathing.  Room air Cardiovascular system: S1-S2.  Regular rate and rhythm.  No murmurs.  No pedal edema Gastrointestinal system: Abdomen is nondistended, soft and nontender. No organomegaly or masses felt. Normal bowel sounds heard. Central nervous system: Alert and oriented. No focal neurological deficits. Extremities: Symmetric 5 x 5 power. Skin: No rashes, lesions or ulcers Psychiatry: Judgement and insight appear impaired. Mood & affect flattened.     Data Reviewed: I have personally reviewed following labs and imaging studies  CBC: Recent Labs  Lab 07/10/20 2109 07/11/20 0316 07/11/20 1228 07/12/20 0140 07/12/20 0433 07/13/20 0318 07/13/20 1004 07/14/20 0200 07/14/20 0826 07/14/20 1630 07/14/20 2334 07/15/20 0500  WBC 13.6* 9.3  --  11.9*  --  6.6  --  7.2  --   --   --  4.8  NEUTROABS 12.3*  --   --   --   --  5.0  --   --   --   --   --   --   HGB 16.0 13.4   < > 12.3*   < > 11.8*   < > 11.3* 10.1* 10.2* 9.9* 10.4*  HCT 46.1 38.9*   < > 35.0*   < > 34.7*   < > 33.2* 29.9* 29.5* 28.6* 29.5*  MCV 87.8 90.5  --  87.1  --  89.4  --  89.5  --   --   --  87.5  PLT 128* 105*  --  113*  --  119*  --  99*  --   --   --  94*   < > = values in this interval not displayed.   Basic Metabolic Panel: Recent Labs  Lab 07/11/20 2351 07/12/20 0140 07/12/20 0932 07/12/20 1610 07/13/20 0317 07/13/20 1702 07/14/20 0200 07/14/20 1630 07/15/20 0500  NA 136 137  --   --  133*  --  132*  --  138  K 3.4* 3.6  --   --  4.1  --  4.0 3.6 3.2*  CL 101 99  --   --  99  --  100  --  106  CO2 23 26  --   --  22  --  22  --  24   GLUCOSE 176* 170*  --   --  292*  --  451*  --  123*  BUN 47* 46*  --   --  42*  --  46*  --  46*  CREATININE 2.89* 2.85*  --   --  2.66*  --  2.85*  --  2.60*  CALCIUM 9.1 9.0  --   --  8.0*  --  7.7*  --  8.4*  MG  --  1.8   < > 2.1 1.9 2.3 2.3  --  2.1  PHOS  --  <1.0*   < > 2.7 3.6 3.2 2.6  --  2.3*   < > = values in this interval not displayed.   GFR: Estimated Creatinine Clearance: 28.2 mL/min (A) (by C-G formula based on SCr of 2.6 mg/dL (H)). Liver Function Tests: Recent Labs  Lab 07/10/20 2109  AST 14*  ALT 11  ALKPHOS 88  BILITOT 1.7*  PROT 7.9  ALBUMIN 4.7   No results for input(s): LIPASE, AMYLASE in the last 168 hours. Recent Labs  Lab 07/11/20 0316 07/13/20 0842  AMMONIA 23 <9*   Coagulation Profile: Recent Labs  Lab 07/10/20 2109 07/12/20 0932  INR 1.0 1.1   Cardiac Enzymes: No results for input(s): CKTOTAL, CKMB, CKMBINDEX, TROPONINI in the last 168 hours. BNP (last 3 results) No results for input(s): PROBNP in the last 8760 hours. HbA1C: No results for input(s): HGBA1C in the last 72 hours. CBG: Recent Labs  Lab 07/14/20 2319 07/15/20 0345 07/15/20 0732 07/15/20 1122 07/15/20 1533  GLUCAP 175* 131* 105* 117* 249*   Lipid Profile: Recent Labs    07/14/20 0039  TRIG 215*   Thyroid Function Tests: Recent Labs    07/13/20 1004  TSH 0.901   Anemia Panel: No results for input(s): VITAMINB12, FOLATE, FERRITIN, TIBC, IRON, RETICCTPCT in the last 72 hours. Sepsis Labs: Recent Labs  Lab 07/10/20 2155 07/10/20 2355 07/11/20 0316 07/12/20 0140 07/13/20 0318  PROCALCITON  --   --  0.24 0.57 2.95  LATICACIDVEN 3.2* 3.8*  --   --   --     Recent Results (from the past 240 hour(s))  Resp Panel by RT-PCR (Flu A&B, Covid) Nasopharyngeal Swab     Status: None   Collection Time: 07/10/20 10:05 PM   Specimen: Nasopharyngeal Swab; Nasopharyngeal(NP) swabs in vial transport medium  Result Value Ref Range Status   SARS Coronavirus 2 by RT  PCR NEGATIVE NEGATIVE Final    Comment: (NOTE) SARS-CoV-2 target nucleic acids are NOT DETECTED.  The SARS-CoV-2 RNA is generally detectable in upper respiratory specimens during the acute phase of infection. The lowest concentration of SARS-CoV-2 viral copies this assay can detect is 138 copies/mL. A negative result does not preclude SARS-Cov-2 infection and should not be used as the sole basis for treatment or other patient management decisions. A negative result may occur with  improper specimen collection/handling, submission of specimen other than nasopharyngeal swab, presence of viral mutation(s) within the areas targeted by this assay, and inadequate number of viral copies(<138 copies/mL). A negative result must be combined with clinical observations, patient history, and epidemiological information. The expected result is Negative.  Fact Sheet for Patients:  EntrepreneurPulse.com.au  Fact Sheet for Healthcare Providers:  IncredibleEmployment.be  This test is no t yet approved or cleared by the [place] FDA and  has been authorized for detection and/or diagnosis of SARS-CoV-2 by FDA under an Emergency Use Authorization (EUA). This EUA will remain  in effect (meaning this test can be used) for the duration of the COVID-19 declaration under Section 564(b)(1) of the Act, 21 U.S.C.section 360bbb-3(b)(1), unless the authorization is terminated  or revoked sooner.       Influenza A by PCR NEGATIVE NEGATIVE Final   Influenza B by PCR NEGATIVE NEGATIVE Final    Comment: (NOTE) The Xpert Xpress SARS-CoV-2/FLU/RSV plus assay is intended as an aid in the diagnosis of influenza from Nasopharyngeal swab specimens and should  not be used as a sole basis for treatment. Nasal washings and aspirates are unacceptable for Xpert Xpress SARS-CoV-2/FLU/RSV testing.  Fact Sheet for Patients: EntrepreneurPulse.com.au  Fact Sheet for  Healthcare Providers: IncredibleEmployment.be  This test is not yet approved or cleared by the [place] FDA and has been authorized for detection and/or diagnosis of SARS-CoV-2 by FDA under an Emergency Use Authorization (EUA). This EUA will remain in effect (meaning this test can be used) for the duration of the COVID-19 declaration under Section 564(b)(1) of the Act, 21 U.S.C. section 360bbb-3(b)(1), unless the authorization is terminated or revoked.  Performed at [facility], [address] [place], [place] [address]   Blood culture (single)     Status: None (Preliminary result)   Collection Time: 07/10/20 11:53 PM   Specimen: BLOOD  Result Value Ref Range Status   Specimen Description BLOOD RIGHT [facility]   Final   Special Requests   Final    BOTTLES DRAWN AEROBIC AND ANAEROBIC Blood Culture adequate volume   Culture   Final    NO GROWTH 4 DAYS Performed at [facility], [address] [place], [place] [address]    Report Status PENDING  Incomplete  MRSA PCR Screening     Status: None   Collection Time: 07/11/20  2:00 AM   Specimen: Nasopharyngeal  Result Value Ref Range Status   MRSA by PCR NEGATIVE NEGATIVE Final    Comment:        The GeneXpert MRSA Assay (FDA approved for NASAL specimens only), is one component of a comprehensive MRSA colonization surveillance program. It is not intended to diagnose MRSA infection nor to guide or monitor treatment for MRSA infections. Performed at [facility], [address] [place], [place] [address]   Culture, Respiratory w Gram Stain     Status: None   Collection Time: 07/12/20 10:41 AM   Specimen: Tracheal Aspirate; Respiratory  Result Value Ref Range Status   Specimen Description   Final    TRACHEAL ASPIRATE Performed at [facility], [address] [place], [place] [address]    Special Requests   Final    NONE Performed at [facility], [address] [place], [place] [address]    Gram Stain   Final    RARE WBC PRESENT, PREDOMINANTLY PMN NO ORGANISMS SEEN    Culture   Final    Normal respiratory flora-no Staph aureus or Pseudomonas seen Performed at [facility] [address] [place],  [address]    Report Status 07/14/2020 FINAL  Final         Radiology Studies: DG Chest Port 1 View  Result Date: 07/15/2020 CLINICAL DATA:  Acute respiratory failure with hypoxia EXAM: PORTABLE CHEST 1 VIEW COMPARISON:  Radiograph 07/12/2020 FINDINGS: Right IJ approach central venous catheter tip terminates at the mid to lower SVC. Interval removal of previously seen transesophageal and endotracheal tubes. Implantable loop recorder projects over the left chest wall. Thoracolumbar fusion hardware is again seen, incompletely assessed. Telemetry leads and external support devices overlie the chest wall. Increasingly coalescent opacities present in the mid to lower lungs. No pneumothorax. No visible effusion. Stable cardiomediastinal contours with a calcified aorta. No acute osseous or soft tissue abnormality. Degenerative changes are present in the imaged spine and shoulders. IMPRESSION: Increasingly patchy and coalescent opacities in the mid to lower lungs. Could reflect developing airspace disease, worsening atelectasis or edema. Electronically Signed   By: Elwin Sleight.D.  On: 07/15/2020 03:37   [place] ASCITES (ABDOMEN LIMITED)  Result Date: 07/14/2020 CLINICAL DATA:  Abdominal distension EXAM: LIMITED ABDOMEN ULTRASOUND FOR ASCITES TECHNIQUE: Limited ultrasound survey for ascites was performed in all four abdominal quadrants. COMPARISON:  06/18/2020 FINDINGS: 4 quadrant ultrasound was performed to assess for ascites. There is no ascites within the abdomen. IMPRESSION: 1. No evidence of ascites. Electronically Signed   By: Randa Ngo M.D.   On: 07/14/2020 20:38        Scheduled Meds: . amiodarone  400 mg Oral BID  .  Chlorhexidine Gluconate Cloth  6 each Topical QHS  . cycloSPORINE modified  200 mg Oral Daily   And  . cycloSPORINE modified  175 mg Oral QHS  . docusate  100 mg Per Tube BID  . [START ON 07/16/2020] DULoxetine  30 mg Oral Daily  . feeding supplement  237 mL Oral BID BM  . insulin aspart  0-15 Units Subcutaneous Q4H  . insulin glargine  30 Units Subcutaneous BID  . mouth rinse  15 mL Mouth Rinse BID  . metoprolol succinate  12.5 mg Oral QHS  . mirtazapine  15 mg Oral QHS  . multivitamin with minerals  1 tablet Oral Daily  . mycophenolate  500 mg Oral BID  . pantoprazole (PROTONIX) IV  40 mg Intravenous Q12H  . phosphorus  500 mg Oral Q4H  . polyethylene glycol  17 g Per Tube Daily  . rosuvastatin  5 mg Oral QPM  . warfarin  7.5 mg Oral ONCE-1600  . Warfarin - Pharmacist Dosing Inpatient   Does not apply q1600   Continuous Infusions: . sodium chloride 10 mL/hr at 07/15/20 1254  . acetaminophen Stopped (07/15/20 1025)  . heparin 1,500 Units/hr (07/15/20 1254)     LOS: 4 days    Time spent: 25 minutes    Sidney Ace, MD [facility] Pager [phone]  If 7PM-7AM, please contact night-coverage 07/15/2020, 3:50 PM

## 2020-07-15 NOTE — Progress Notes (Signed)
[facility] for heparin infusion and bridge to warfarin Indication: atrial fibrillation  No Known Allergies  Patient Measurements: Height: 6' (182.9 cm) Weight: 78.5 kg (173 lb 1 oz) IBW/kg (Calculated) : 77.6  Vital Signs: Temp: 99.5 F (37.5 C) (05/04 0400) Temp Source: Oral (05/04 0400) BP: 138/58 (05/04 0600) Pulse Rate: 73 (05/04 0600)  Labs: Recent Labs    07/13/20 0317 07/13/20 0318 07/13/20 1004 07/13/20 1224 07/13/20 1400 07/13/20 1702 07/14/20 0200 07/14/20 0826 07/14/20 1630 07/14/20 2334 07/15/20 0203 07/15/20 0500  HGB  --  11.8*   < >  --   --    < > 11.3* 10.1* 10.2* 9.9*  --  10.4*  HCT  --  34.7*   < >  --   --    < > 33.2* 29.9* 29.5* 28.6*  --  29.5*  PLT  --  119*  --   --   --   --  99*  --   --   --   --  94*  HEPARINUNFRC  --   --   --   --   --    < > 0.35 0.32 0.24*  --  0.36  --   CREATININE 2.66*  --   --   --   --   --  2.85*  --   --   --   --  2.60*  TROPONINIHS  --   --   --  32* 34*  --   --   --   --   --   --   --    < > = values in this interval not displayed.    Estimated Creatinine Clearance: 28.2 mL/min (A) (by C-G formula based on SCr of 2.6 mg/dL (H)).   Medical History: Past Medical History:  Diagnosis Date  . Atrial fibrillation ([place])   . BPH (benign prostatic hyperplasia)   . Cancer ([place])    liver  . Cirrhosis ([place])   . Diabetes mellitus without complication ([place])   . GERD (gastroesophageal reflux disease)   . Hepatitis C     Assessment: 70 y/o M with medical history including cirrhosis / liver cancer s/p liver transplant in 2018, pulmonary arterial hypertension, diabetes, Afib, BPH, substance abuse who is admitted with acute hypoxic respiratory failure secondary to acute encephalopathy / possible CVA and hyperglycemic emergency / AKI. Pt not on anticoagulation prior to admission. reports of coffee ground emesis on 4/29 and has since resolved. Pharmacy has been consulted for  heparin dosing and monitoring for afib and bridge to warfarin. Hgb 12.1, Hct 35.2; aPTT/PT/INR wnl.  Dosing by wt of 77.6 kg  Pt also taking amiodarone and cyclosporine which can increase effects of warfarin. Will be monitored daily  Heparin Date Time HL Rate/Comment 5/2 1702 0.29 Slightly < goal; 1200>>1350 un/hr 5/2 0200 0.35 Therapeutic x 1; 1350 un/hr 5/3 0826 0.32 Therapeutic x2; 1350 un/hr 5/3 1430 0.24 Subtherapeutic; 1350>> 5/4 0203 0.36 Therapeutic x 1 5/4 1048 0.19 Subtherapeutic, spoke with nurse who stated infusion had been off for ~1.5 hours  Warfarin  Date Time INR Comment 5/1 0932 1.1 Baseline INR   Goal of Therapy:  Heparin level 0.3-0.7 units/ml Monitor platelets by anticoagulation protocol: Yes   Plan:   HL 0.19, subtherapeutic; since infusion was off, will not rebolus and will restart heparin infusion at previous rate 1500 units/hr   Will recheck HL in 8 hrs  Based on warfarin nomogram and predictor  points, will give 7.5mg  warfarin tonight.   Re-check INR with AM labs  Continue to monitor H&H and platelets  Sherilyn Banker, PharmD Pharmacy Resident  07/15/2020 11:33 AM

## 2020-07-15 NOTE — Progress Notes (Signed)
Occupational Therapy Treatment Patient Details Name: Hector Mueller MRN: [id number] DOB: [birth year] Today's Date: 07/15/2020    History of present illness 70 y/o male with h/o liver cirrhosis due to hepatitis C status post liver transplant in 2018, DM, BPH, GERD, AFib and CKD who is admitted with AKI and possible CVA. MRI negative for CVA   OT comments  Upon entering the room, pt supine in bed with wife present in the room. Pt agreeable to OT intervention. Pt seen for skilled co-tx with PT to safely address functional transfers and self care tasks. Pt performed bed mobility with min A of 2 to EOB. Pt reports urgency for toileting and performed stand pivot transfer to [facility] with mod A of 2 for physical assistance and to manage equipment. Pt able to have BM and stands with mod A while second therapist assists pt with hygiene. OT removed BSC in order to move recliner chair closer to pt with him taking small step towards it. Pt fatigues very quickly this session. Pt continues to benefit from OT intervention. All needs within reach and wife present in room.    Follow Up Recommendations  SNF    Equipment Recommendations  Other (comment) (defer to next venue of care)       Precautions / Restrictions Precautions Precautions: Fall Restrictions Weight Bearing Restrictions: No       Mobility Bed Mobility Overal bed mobility: Needs Assistance Bed Mobility: Supine to Sit     Supine to sit: Min assist;+2 for physical assistance Sit to supine: Mod assist;+2 for safety/equipment   General bed mobility comments: patient requires min assist for bed mobility, cues for hand placement.    Transfers Overall transfer level: Needs assistance Equipment used: Rolling walker (2 wheeled);None Transfers: Risk manager;Sit to/from Stand Sit to Stand: Min assist;+2 safety/equipment Stand pivot transfers: Min assist;+2 safety/equipment       General transfer comment: patient pivoted from bed to  [facility], then performed sit to stand with RW and took 1-2 steps to recliner    Balance Overall balance assessment: Needs assistance Sitting-balance support: Feet supported Sitting balance-Leahy Scale: Good Sitting balance - Comments: CGA for balance on BSC   Standing balance support: Bilateral upper extremity supported;During functional activity Standing balance-Leahy Scale: Poor Standing balance comment: weak, reliant on B UE support                           ADL either performed or assessed with clinical judgement   ADL Overall ADL's : Needs assistance/impaired                         Toilet Transfer: Minimal assistance;Moderate assistance;+2 for physical assistance;+2 for safety/equipment;BSC;RW;Ambulation   Toileting- Clothing Manipulation and Hygiene: Total assistance Toileting - Clothing Manipulation Details (indicate cue type and reason): for hygiene and clothing management             Vision Patient Visual Report: No change from baseline            Cognition Arousal/Alertness: Awake/alert Behavior During Therapy: WFL for tasks assessed/performed Overall Cognitive Status: Impaired/Different from baseline                                 General Comments: oriented to self and location. Pt was cooperative but needing increased time and cuing to initiate and sequence tasks.  Pertinent Vitals/ Pain       Pain Assessment: Faces Faces Pain Scale: Hurts even more Pain Location: lower back Pain Descriptors / Indicators: Aching;Discomfort;Grimacing Pain Intervention(s): Monitored during session;Repositioned         Frequency  Min 2X/week        Progress Toward Goals  OT Goals(current goals can now be found in the care plan section)  Progress towards OT goals: Progressing toward goals  Acute Rehab OT Goals Patient Stated Goal: to get better OT Goal Formulation: With patient Time For Goal Achievement:  07/28/20 Potential to Achieve Goals: Good  Plan Discharge plan remains appropriate    Co-evaluation    PT/OT/SLP Co-Evaluation/Treatment: Yes Reason for Co-Treatment: For patient/therapist safety PT goals addressed during session: Mobility/safety with mobility OT goals addressed during session: ADL's and self-care      AM-PAC OT "6 Clicks" Daily Activity     Outcome Measure   Help from another person eating meals?: A Little Help from another person taking care of personal grooming?: A Little Help from another person toileting, which includes using toliet, bedpan, or urinal?: A Lot Help from another person bathing (including washing, rinsing, drying)?: A Lot Help from another person to put on and taking off regular upper body clothing?: A Little Help from another person to put on and taking off regular lower body clothing?: A Lot 6 Click Score: 15    End of Session    OT Visit Diagnosis: Unsteadiness on feet (R26.81);Repeated falls (R29.6);Muscle weakness (generalized) (M62.81)   Activity Tolerance Patient tolerated treatment well   Patient Left with call bell/phone within reach;in chair;with chair alarm set   Nurse Communication          Time: 4917-9150 OT Time Calculation (min): 28 min  Charges: OT General Charges $OT Visit: 1 Visit OT Treatments $Self Care/Home Management : 8-22 mins  Darleen Crocker, MS, OTR/L , CBIS ascom [phone]  07/15/20, 2:53 PM

## 2020-07-15 NOTE — Progress Notes (Signed)
Physical Therapy Treatment Patient Details Name: Hector Mueller MRN: [id number] DOB: Jul 18, 1950 Today's Date: 07/15/2020    History of Present Illness 70 y/o male with h/o liver cirrhosis due to hepatitis C status post liver transplant in 2018, DM, BPH, GERD, AFib and CKD who is admitted with AKI and possible CVA. MRI negative for CVA    PT Comments    Patient received in bed, wife present in room. He is agreeable to PT session. Reports he needs the bed pan. Assisted patient to get oob to [facility] instead.  Patient requires min assist for supine to sit. Min +2 assist for sit to stand and pivot transfers. Able to ambulate 2 steps from [facility] to recliner. Patient is limited by weakness and fatigue with mobility. He will continue to benefit from skilled PT while here to improve strength, endurance and safety with mobility.      Follow Up Recommendations  SNF;Supervision - Intermittent     Equipment Recommendations  Rolling walker with 5" wheels    Recommendations for Other Services       Precautions / Restrictions Precautions Precautions: Fall Restrictions Weight Bearing Restrictions: No    Mobility  Bed Mobility Overal bed mobility: Needs Assistance Bed Mobility: Supine to Sit     Supine to sit: Min assist;+2 for physical assistance     General bed mobility comments: patient requires min assist for bed mobility, cues for hand placement.    Transfers Overall transfer level: Needs assistance Equipment used: Rolling walker (2 wheeled);None Transfers: Risk manager;Sit to/from Stand Sit to Stand: Min assist;+2 safety/equipment Stand pivot transfers: Min assist;+2 safety/equipment       General transfer comment: patient pivoted from bed to [facility], then performed sit to stand with RW and took 1-2 steps to recliner  Ambulation/Gait Ambulation/Gait assistance: Min guard Gait Distance (Feet): 2 Feet Assistive device: Rolling walker (2 wheeled) Gait Pattern/deviations: Step-to  pattern;Decreased step length - right;Decreased step length - left Gait velocity: decr   General Gait Details: patient weak. No lob with mobility, but requires assistance and RW for support.   Stairs             Wheelchair Mobility    Modified Rankin (Stroke Patients Only)       Balance Overall balance assessment: Needs assistance Sitting-balance support: Feet supported Sitting balance-Leahy Scale: Good Sitting balance - Comments: CGA for balance   Standing balance support: Bilateral upper extremity supported;During functional activity Standing balance-Leahy Scale: Fair Standing balance comment: weak, reliant on B UE support                            Cognition Arousal/Alertness: Awake/alert Behavior During Therapy: WFL for tasks assessed/performed Overall Cognitive Status: Within Functional Limits for tasks assessed                                        Exercises      General Comments        Pertinent Vitals/Pain Pain Assessment: Faces Faces Pain Scale: Hurts even more Pain Location: lower back Pain Descriptors / Indicators: Aching;Discomfort;Grimacing Pain Intervention(s): Monitored during session;Repositioned    Home Living                      Prior Function            PT Goals (current goals  can now be found in the care plan section) Acute Rehab PT Goals Patient Stated Goal: to get better PT Goal Formulation: With patient Time For Goal Achievement: 07/28/20 Potential to Achieve Goals: Good Progress towards PT goals: Progressing toward goals    Frequency    Min 2X/week      PT Plan Current plan remains appropriate    Co-evaluation PT/OT/SLP Co-Evaluation/Treatment: Yes Reason for Co-Treatment: For patient/therapist safety PT goals addressed during session: Mobility/safety with mobility        AM-PAC PT "6 Clicks" Mobility   Outcome Measure  Help needed turning from your back to your side  while in a flat bed without using bedrails?: A Lot Help needed moving from lying on your back to sitting on the side of a flat bed without using bedrails?: A Lot Help needed moving to and from a bed to a chair (including a wheelchair)?: A Lot Help needed standing up from a chair using your arms (e.g., wheelchair or bedside chair)?: A Little Help needed to walk in hospital room?: A Lot Help needed climbing 3-5 steps with a railing? : Total 6 Click Score: 12    End of Session   Activity Tolerance: Patient limited by fatigue Patient left: in chair;with call bell/phone within reach;with family/visitor present Nurse Communication: Mobility status PT Visit Diagnosis: Other abnormalities of gait and mobility (R26.89);Muscle weakness (generalized) (M62.81);Unsteadiness on feet (R26.81);Difficulty in walking, not elsewhere classified (R26.2);Pain Pain - part of body:  (back)     Time: 3335-4562 PT Time Calculation (min) (ACUTE ONLY): 28 min  Charges:  $Therapeutic Activity: 8-22 mins                     Daphna Lafuente, PT, GCS 07/15/20,2:26 PM

## 2020-07-15 NOTE — Progress Notes (Signed)
Progress Note  Patient Name: Hector Mueller Date of Encounter: 07/15/2020  [facility] Cardiologist: [facility]  Subjective   Feels tired, denies palpitations or chest pain.  Maintaining sinus rhythm  Inpatient Medications    Scheduled Meds: . Chlorhexidine Gluconate Cloth  6 each Topical QHS  . cycloSPORINE  200 mg Per Tube Daily   And  . cycloSPORINE  175 mg Per Tube QHS  . docusate  100 mg Per Tube BID  . insulin aspart  0-15 Units Subcutaneous Q4H  . insulin glargine  30 Units Subcutaneous BID  . mouth rinse  15 mL Mouth Rinse BID  . metoprolol succinate  12.5 mg Oral QHS  . pantoprazole (PROTONIX) IV  40 mg Intravenous Q12H  . polyethylene glycol  17 g Per Tube Daily   Continuous Infusions: . sodium chloride 10 mL/hr at 07/15/20 0800  . acetaminophen    . amiodarone 30 mg/hr (07/15/20 0800)  . heparin 1,500 Units/hr (07/15/20 0800)  . insulin Stopped (07/14/20 2206)  . mycophenolate (CELLCEPT) IV    . potassium chloride 10 mEq (07/15/20 0851)  . potassium PHOSPHATE IVPB (in mmol)     PRN Meds: acetaminophen, albuterol, docusate sodium, polyethylene glycol, polyvinyl alcohol   Vital Signs    Vitals:   07/15/20 0300 07/15/20 0400 07/15/20 0500 07/15/20 0600  BP: 123/64 (!) 146/67 137/61 (!) 138/58  Pulse: 76 78 77 73  Resp: 13 13 15 10   Temp:  99.5 F (37.5 C)    TempSrc:  Oral    SpO2: 99% 99% 99% 98%  Weight:      Height:        Intake/Output Summary (Last 24 hours) at 07/15/2020 0957 Last data filed at 07/15/2020 0800 Gross per 24 hour  Intake 1160.63 ml  Output 940 ml  Net 220.63 ml   Last 3 Weights 07/14/2020 07/13/2020 07/12/2020  Weight (lbs) 173 lb 1 oz 180 lb 8.9 oz 167 lb 1.7 oz  Weight (kg) 78.5 kg 81.9 kg 75.8 kg      Telemetry    Sinus rhythm- Personally Reviewed  ECG    No new EKG reviewed  Physical Exam   GEN: No acute distress.   Neck: No JVD Cardiac: RRR, no murmurs, rubs, or gallops.  Respiratory: Clear to auscultation  anteriorly, diminished at bases GI: Soft, nontender, distended  MS: No edema; No deformity. Neuro:  Nonfocal  Psych: Normal affect   Labs    High Sensitivity Troponin:   Recent Labs  Lab 07/13/20 1224 07/13/20 1400  TROPONINIHS 32* 34*      Chemistry Recent Labs  Lab 07/10/20 2109 07/11/20 0316 07/13/20 0317 07/14/20 0200 07/14/20 1630 07/15/20 0500  NA 133*   < > 133* 132*  --  138  K 4.9   < > 4.1 4.0 3.6 3.2*  CL 81*   < > 99 100  --  106  CO2 25   < > 22 22  --  24  GLUCOSE 667*   < > 292* 451*  --  123*  BUN 53*   < > 42* 46*  --  46*  CREATININE 3.35*   < > 2.66* 2.85*  --  2.60*  CALCIUM 10.7*   < > 8.0* 7.7*  --  8.4*  PROT 7.9  --   --   --   --   --   ALBUMIN 4.7  --   --   --   --   --   AST  14*  --   --   --   --   --   ALT 11  --   --   --   --   --   ALKPHOS 88  --   --   --   --   --   BILITOT 1.7*  --   --   --   --   --   GFRNONAA 19*   < > 25* 23*  --  25*  ANIONGAP 27*   < > 12 10  --  8   < > = values in this interval not displayed.     Hematology Recent Labs  Lab 07/13/20 0318 07/13/20 1004 07/14/20 0200 07/14/20 0826 07/14/20 1630 07/14/20 2334 07/15/20 0500  WBC 6.6  --  7.2  --   --   --  4.8  RBC 3.88*  --  3.71*  --   --   --  3.37*  HGB 11.8*   < > 11.3*   < > 10.2* 9.9* 10.4*  HCT 34.7*   < > 33.2*   < > 29.5* 28.6* 29.5*  MCV 89.4  --  89.5  --   --   --  87.5  MCH 30.4  --  30.5  --   --   --  30.9  MCHC 34.0  --  34.0  --   --   --  35.3  RDW 12.8  --  13.2  --   --   --  13.1  PLT 119*  --  99*  --   --   --  94*   < > = values in this interval not displayed.    BNPNo results for input(s): BNP, PROBNP in the last 168 hours.   DDimer No results for input(s): DDIMER in the last 168 hours.   Radiology    CT HEAD WO CONTRAST  Result Date: 07/13/2020 CLINICAL DATA:  Follow-up stroke EXAM: CT HEAD WITHOUT CONTRAST TECHNIQUE: Contiguous axial images were obtained from the base of the skull through the vertex without  intravenous contrast. COMPARISON:  CT studies 07/10/2020 FINDINGS: Brain: Age related brain volume loss. No focal abnormality affects the brainstem or cerebellum. Cerebral hemispheres show chronic small-vessel ischemic changes of the white matter. 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 No sign of acute infarction, mass lesion, hemorrhage, hydrocephalus or extra-axial collection. Vascular: There is atherosclerotic calcification of the major vessels at the base of the brain. Skull: Negative Sinuses/Orbits: Scattered mild inflammatory changes of the paranasal sinuses. No advanced finding. Orbits negative. Other: None IMPRESSION: No acute finding by CT. Age related atrophy. Chronic small-vessel ischemic changes of the white matter. Electronically Signed   By: Nelson Chimes M.D.   On: 07/13/2020 15:33   DG Chest Port 1 View  Result Date: 07/15/2020 CLINICAL DATA:  Acute respiratory failure with hypoxia EXAM: PORTABLE CHEST 1 VIEW COMPARISON:  Radiograph 07/12/2020 FINDINGS: Right IJ approach central venous catheter tip terminates at the mid to lower SVC. Interval removal of previously seen transesophageal and endotracheal tubes. Implantable loop recorder projects over the left chest wall. Thoracolumbar fusion hardware is again seen, incompletely assessed. Telemetry leads and external support devices overlie the chest wall. Increasingly coalescent opacities present in the mid to lower lungs. No pneumothorax. No visible effusion. Stable cardiomediastinal contours with a calcified aorta. No acute osseous or soft tissue abnormality. Degenerative changes are present in the imaged spine and shoulders. IMPRESSION: Increasingly patchy and coalescent opacities in  the mid to lower lungs. Could reflect developing airspace disease, worsening atelectasis or edema. Electronically Signed   By: Lovena Le M.D.   On: 07/15/2020 03:37   ECHOCARDIOGRAM COMPLETE  Result Date: 07/13/2020    ECHOCARDIOGRAM REPORT   Patient  Name:   Hector Mueller Date of Exam: 07/13/2020 Medical Rec #:  [id number]      Height:       72.0 in Accession #:    [id number]     Weight:       180.6 lb Date of Birth:  1950-11-18       BSA:          2.040 m Patient Age:    70 years       BP:           90/52 mmHg Patient Gender: M              HR:           31 bpm. Exam Location:  [facility] Procedure: 2D Echo, Cardiac Doppler and Color Doppler Indications:     Atrial Fibrillation I48.91  History:         Patient has no prior history of Echocardiogram examinations.                  Arrythmias:Atrial Fibrillation; Risk Factors:Diabetes.  Sonographer:     Sherrie Sport RDCS (AE) Referring Phys:  [id number] CHRISTOPHER END Diagnosing Phys: Kate Sable MD  Sonographer Comments: Echo performed with patient supine and on artificial respirator. Image acquisition challenging due to respiratory motion. IMPRESSIONS  1. Left ventricular ejection fraction, by estimation, is 40 to 45%. The left ventricle has mild to moderately decreased function. The left ventricle demonstrates global hypokinesis. Left ventricular diastolic parameters are consistent with Grade I diastolic dysfunction (impaired relaxation).  2. Right ventricular systolic function is normal. The right ventricular size is normal.  3. The mitral valve is grossly normal. No evidence of mitral valve regurgitation.  4. The aortic valve was not well visualized. Aortic valve regurgitation is not visualized.  5. Pulmonic valve regurgitation not assessed. FINDINGS  Left Ventricle: Left ventricular ejection fraction, by estimation, is 40 to 45%. The left ventricle has mild to moderately decreased function. The left ventricle demonstrates global hypokinesis. The left ventricular internal cavity size was normal in size. There is no left ventricular hypertrophy. Left ventricular diastolic parameters are consistent with Grade I diastolic dysfunction (impaired relaxation). Right Ventricle: The right ventricular size is normal. No increase  in right ventricular wall thickness. Right ventricular systolic function is normal. Left Atrium: Left atrial size was normal in size. Right Atrium: Right atrial size was normal in size. Pericardium: There is no evidence of pericardial effusion. Mitral Valve: The mitral valve is grossly normal. No evidence of mitral valve regurgitation. Tricuspid Valve: The tricuspid valve is grossly normal. Tricuspid valve regurgitation is not demonstrated. Aortic Valve: The aortic valve was not well visualized. Aortic valve regurgitation is not visualized. Aortic valve mean gradient measures 3.0 mmHg. Aortic valve peak gradient measures 5.2 mmHg. Aortic valve area, by VTI measures 2.29 cm. Pulmonic Valve: The pulmonic valve was not well visualized. Pulmonic valve regurgitation not assessed. Aorta: The aortic root was not well visualized. IAS/Shunts: No atrial level shunt detected by color flow Doppler.  LEFT VENTRICLE PLAX 2D LVIDd:         3.32 cm  Diastology LVIDs:         2.44 cm  LV e' medial:    5.33  cm/s LV PW:         1.08 cm  LV E/e' medial:  11.7 LV IVS:        0.97 cm  LV e' lateral:   6.96 cm/s LVOT diam:     2.00 cm  LV E/e' lateral: 9.0 LV SV:         44 LV SV Index:   21 LVOT Area:     3.14 cm  RIGHT VENTRICLE RV S prime:     8.81 cm/s TAPSE (M-mode): 2.4 cm LEFT ATRIUM             Index       RIGHT ATRIUM           Index LA diam:        3.60 cm 1.76 cm/m  RA Area:     12.30 cm LA Vol (A2C):   23.1 ml 11.32 ml/m RA Volume:   28.50 ml  13.97 ml/m LA Vol (A4C):   28.4 ml 13.92 ml/m LA Biplane Vol: 27.6 ml 13.53 ml/m  AORTIC VALVE AV Area (Vmax):    2.27 cm AV Area (Vmean):   2.04 cm AV Area (VTI):     2.29 cm AV Vmax:           114.00 cm/s AV Vmean:          78.250 cm/s AV VTI:            0.191 m AV Peak Grad:      5.2 mmHg AV Mean Grad:      3.0 mmHg LVOT Vmax:         82.20 cm/s LVOT Vmean:        50.800 cm/s LVOT VTI:          0.139 m LVOT/AV VTI ratio: 0.73 MITRAL VALVE               TRICUSPID VALVE MV  Area (PHT): 3.08 cm    TR Peak grad:   22.1 mmHg MV Decel Time: 246 msec    TR Vmax:        235.00 cm/s MV E velocity: 62.35 cm/s MV A velocity: 90.40 cm/s  SHUNTS MV E/A ratio:  0.69        Systemic VTI:  0.14 m                            Systemic Diam: 2.00 cm Kate Sable MD Electronically signed by Kate Sable MD Signature Date/Time: 07/13/2020/1:36:20 PM    Final    [place] ASCITES (ABDOMEN LIMITED)  Result Date: 07/14/2020 CLINICAL DATA:  Abdominal distension EXAM: LIMITED ABDOMEN ULTRASOUND FOR ASCITES TECHNIQUE: Limited ultrasound survey for ascites was performed in all four abdominal quadrants. COMPARISON:  06/18/2020 FINDINGS: 4 quadrant ultrasound was performed to assess for ascites. There is no ascites within the abdomen. IMPRESSION: 1. No evidence of ascites. Electronically Signed   By: Randa Ngo M.D.   On: 07/14/2020 20:38    Cardiac Studies   TTEcho 07/13/2020 1. Left ventricular ejection fraction, by estimation, is 40 to 45%. The  left ventricle has mild to moderately decreased function. The left  ventricle demonstrates global hypokinesis. Left ventricular diastolic  parameters are consistent with Grade I  diastolic dysfunction (impaired relaxation).  2. Right ventricular systolic function is normal. The right ventricular  size is normal.  3. The mitral valve is grossly normal. No evidence of mitral valve  regurgitation.  4.  The aortic valve was not well visualized. Aortic valve regurgitation  is not visualized.  5. Pulmonic valve regurgitation not assessed.   Patient Profile     70 y.o. male with history of SSS s/p Micra pacemaker in 2018, persistent A. fib, cirrhosis s/p liver transplant 2018, diabetes admitted with altered mental status, severely elevated blood sugars, found to have A. fib RVR.  Assessment & Plan    1.  A. fib RVR -Status post emergent DC cardioversion.  Maintaining sinus rhythm. -Stop IV amiodarone, start p.o. amiodarone 400 mg twice  daily x 7days, then decrease to 200mg  bid. -Start warfarin per pharmacy protocol.  Heparin bridge. -Avoiding NOAC due to history of liver dysfunction, transplant.  2.  Mild to moderately reduced EF, 40 to 45% -Toprol-XL 12.5 mg daily, not using ACE/ARB/Arni due to AKI. -Consider ischemic work-up as outpatient  3.  Altered mental status, elevated blood sugars -Head CT with no acute infarct -Management as per medicine/ICU team  Total encounter time 35 minutes  Greater than 50% was spent in counseling and coordination of care with the patient    Signed, Kate Sable, MD  07/15/2020, 9:57 AM

## 2020-07-16 DIAGNOSIS — J9601 Acute respiratory failure with hypoxia: Secondary | ICD-10-CM | POA: Diagnosis not present

## 2020-07-16 DIAGNOSIS — I4891 Unspecified atrial fibrillation: Secondary | ICD-10-CM | POA: Diagnosis not present

## 2020-07-16 LAB — BASIC METABOLIC PANEL
Anion gap: 8 (ref 5–15)
BUN: 43 mg/dL — ABNORMAL HIGH (ref 8–23)
CO2: 22 mmol/L (ref 22–32)
Calcium: 7.7 mg/dL — ABNORMAL LOW (ref 8.9–10.3)
Chloride: 109 mmol/L (ref 98–111)
Creatinine, Ser: 2.42 mg/dL — ABNORMAL HIGH (ref 0.61–1.24)
GFR, Estimated: 28 mL/min — ABNORMAL LOW (ref >60)
Glucose, Bld: 66 mg/dL — ABNORMAL LOW (ref 70–99)
Potassium: 3.1 mmol/L — ABNORMAL LOW (ref 3.5–5.1)
Sodium: 139 mmol/L (ref 135–145)

## 2020-07-16 LAB — CBC
HCT: 31.1 % — ABNORMAL LOW (ref 39.0–52.0)
Hemoglobin: 10.6 g/dL — ABNORMAL LOW (ref 13.0–17.0)
MCH: 30.5 pg (ref 26.0–34.0)
MCHC: 34.1 g/dL (ref 30.0–36.0)
MCV: 89.6 fL (ref 80.0–100.0)
Platelets: 121 10*3/uL — ABNORMAL LOW (ref 150–400)
RBC: 3.47 MIL/uL — ABNORMAL LOW (ref 4.22–5.81)
RDW: 13.8 % (ref 11.5–15.5)
WBC: 4.5 10*3/uL (ref 4.0–10.5)
nRBC: 0 % (ref 0.0–0.2)

## 2020-07-16 LAB — PROTIME-INR
INR: 1.1 (ref 0.8–1.2)
Prothrombin Time: 14.2 seconds (ref 11.4–15.2)

## 2020-07-16 LAB — GLUCOSE, CAPILLARY
Glucose-Capillary: 115 mg/dL — ABNORMAL HIGH (ref 70–99)
Glucose-Capillary: 63 mg/dL — ABNORMAL LOW (ref 70–99)
Glucose-Capillary: 65 mg/dL — ABNORMAL LOW (ref 70–99)
Glucose-Capillary: 70 mg/dL (ref 70–99)
Glucose-Capillary: 77 mg/dL (ref 70–99)
Glucose-Capillary: 87 mg/dL (ref 70–99)
Glucose-Capillary: 88 mg/dL (ref 70–99)
Glucose-Capillary: 96 mg/dL (ref 70–99)

## 2020-07-16 LAB — CULTURE, BLOOD (SINGLE)
Culture: NO GROWTH
Special Requests: ADEQUATE

## 2020-07-16 LAB — HEPARIN LEVEL (UNFRACTIONATED)
Heparin Unfractionated: 0.3 IU/mL (ref 0.30–0.70)
Heparin Unfractionated: 0.41 IU/mL (ref 0.30–0.70)

## 2020-07-16 LAB — MAGNESIUM: Magnesium: 1.9 mg/dL (ref 1.7–2.4)

## 2020-07-16 LAB — PHOSPHORUS: Phosphorus: 3.1 mg/dL (ref 2.5–4.6)

## 2020-07-16 MED ORDER — ACETAMINOPHEN 500 MG PO TABS
1000.0000 mg | ORAL_TABLET | Freq: Once | ORAL | Status: AC
  Administered 2020-07-16: 1000 mg via ORAL
  Filled 2020-07-16: qty 2

## 2020-07-16 MED ORDER — METHOCARBAMOL 500 MG PO TABS
500.0000 mg | ORAL_TABLET | Freq: Once | ORAL | Status: AC
  Administered 2020-07-16: 500 mg via ORAL
  Filled 2020-07-16: qty 1

## 2020-07-16 MED ORDER — WARFARIN SODIUM 7.5 MG PO TABS
7.5000 mg | ORAL_TABLET | Freq: Once | ORAL | Status: AC
  Administered 2020-07-16: 7.5 mg via ORAL
  Filled 2020-07-16: qty 1

## 2020-07-16 MED ORDER — POTASSIUM CHLORIDE 10 MEQ/100ML IV SOLN
10.0000 meq | INTRAVENOUS | Status: AC
  Administered 2020-07-16 (×5): 10 meq via INTRAVENOUS
  Filled 2020-07-16 (×5): qty 100

## 2020-07-16 MED ORDER — METOPROLOL SUCCINATE ER 25 MG PO TB24
12.5000 mg | ORAL_TABLET | Freq: Once | ORAL | Status: AC
  Administered 2020-07-16: 12.5 mg via ORAL
  Filled 2020-07-16: qty 0.5

## 2020-07-16 MED ORDER — DEXTROSE 50 % IV SOLN
INTRAVENOUS | Status: AC
  Administered 2020-07-16: 12.5 g via INTRAVENOUS
  Filled 2020-07-16: qty 50

## 2020-07-16 MED ORDER — MAGNESIUM SULFATE 2 GM/50ML IV SOLN
2.0000 g | Freq: Once | INTRAVENOUS | Status: AC
  Administered 2020-07-16: 2 g via INTRAVENOUS
  Filled 2020-07-16: qty 50

## 2020-07-16 MED ORDER — INSULIN ASPART 100 UNIT/ML IJ SOLN
0.0000 [IU] | INTRAMUSCULAR | Status: DC
  Administered 2020-07-17: 1 [IU] via SUBCUTANEOUS
  Administered 2020-07-18: 2 [IU] via SUBCUTANEOUS
  Administered 2020-07-19 (×3): 1 [IU] via SUBCUTANEOUS
  Administered 2020-07-20: 2 [IU] via SUBCUTANEOUS
  Administered 2020-07-20: 1 [IU] via SUBCUTANEOUS
  Filled 2020-07-16 (×7): qty 1

## 2020-07-16 MED ORDER — METOPROLOL SUCCINATE ER 25 MG PO TB24
25.0000 mg | ORAL_TABLET | Freq: Two times a day (BID) | ORAL | Status: DC
  Administered 2020-07-16 – 2020-07-20 (×8): 25 mg via ORAL
  Filled 2020-07-16 (×8): qty 1

## 2020-07-16 MED ORDER — INSULIN GLARGINE 100 UNIT/ML ~~LOC~~ SOLN
25.0000 [IU] | Freq: Two times a day (BID) | SUBCUTANEOUS | Status: DC
  Administered 2020-07-16: 25 [IU] via SUBCUTANEOUS
  Filled 2020-07-16 (×4): qty 0.25

## 2020-07-16 MED ORDER — DEXTROSE 50 % IV SOLN
12.5000 g | INTRAVENOUS | Status: AC
Start: 2020-07-16 — End: 2020-07-16

## 2020-07-16 NOTE — Progress Notes (Signed)
[place] for heparin infusion and bridge to warfarin Indication: atrial fibrillation  No Known Allergies  Patient Measurements: Height: 6' (182.9 cm) Weight: 78.5 kg (173 lb 1 oz) IBW/kg (Calculated) : 77.6  Vital Signs: Temp: 98.8 F (37.1 C) (05/04 1600) Temp Source: Oral (05/04 1600) BP: 153/77 (05/05 0000) Pulse Rate: 92 (05/05 0000)  Labs: Recent Labs    07/13/20 0317 07/13/20 0318 07/13/20 1004 07/13/20 1224 07/13/20 1400 07/13/20 1702 07/14/20 0200 07/14/20 0826 07/14/20 1630 07/14/20 2334 07/15/20 0203 07/15/20 0500 07/15/20 1048 07/16/20 0139  HGB  --  11.8*   < >  --   --    < > 11.3*   < > 10.2* 9.9*  --  10.4*  --   --   HCT  --  34.7*   < >  --   --    < > 33.2*   < > 29.5* 28.6*  --  29.5*  --   --   PLT  --  119*  --   --   --   --  99*  --   --   --   --  94*  --   --   HEPARINUNFRC  --   --   --   --   --    < > 0.35   < > 0.24*  --  0.36  --  0.19* 0.30  CREATININE 2.66*  --   --   --   --   --  2.85*  --   --   --   --  2.60*  --   --   TROPONINIHS  --   --   --  32* 34*  --   --   --   --   --   --   --   --   --    < > = values in this interval not displayed.    Estimated Creatinine Clearance: 28.2 mL/min (A) (by C-G formula based on SCr of 2.6 mg/dL (H)).   Medical History: Past Medical History:  Diagnosis Date  . Atrial fibrillation ([place])   . BPH (benign prostatic hyperplasia)   . Cancer ([place])    liver  . Cirrhosis ([place])   . Diabetes mellitus without complication ([place])   . GERD (gastroesophageal reflux disease)   . Hepatitis C     Assessment: 70 y/o M with medical history including cirrhosis / liver cancer s/p liver transplant in 2018, pulmonary arterial hypertension, diabetes, Afib, BPH, substance abuse who is admitted with acute hypoxic respiratory failure secondary to acute encephalopathy / possible CVA and hyperglycemic emergency / AKI. Pt not on anticoagulation prior to admission. reports  of coffee ground emesis on 4/29 and has since resolved. Pharmacy has been consulted for heparin dosing and monitoring for afib and bridge to warfarin. Hgb 12.1, Hct 35.2; aPTT/PT/INR wnl.  Dosing by wt of 77.6 kg  Pt also taking amiodarone and cyclosporine which can increase effects of warfarin. Will be monitored daily  Heparin Date Time HL Rate/Comment 5/2 1702 0.29 Slightly < goal; 1200>>1350 un/hr 5/2 0200 0.35 Therapeutic x 1; 1350 un/hr 5/3 0826 0.32 Therapeutic x2; 1350 un/hr 5/3 1430 0.24 Subtherapeutic; 1350>> 5/4 0203 0.36 Therapeutic x 1 5/4 1048 0.19 Subtherapeutic, spoke with nurse who stated infusion had been off for ~1.5 hours 5/5 0139 0.30 Therapeutic x 1  Warfarin  Date Time INR Comment 5/1 0932 1.1 Baseline INR   Goal of Therapy:  Heparin level 0.3-0.7 units/ml Monitor platelets by anticoagulation protocol: Yes   Plan:   Continue heparin infusion at1500 units/hr   Will recheck HL in 8 hrs to confirm  Pt given 7.5mg  warfarin 5/4 @ 1650.   Re-check INR with AM labs  Continue to monitor H&H and platelets  Renda Rolls, PharmD, [facility] 07/16/2020 2:31 AM

## 2020-07-16 NOTE — Progress Notes (Signed)
Inpatient Diabetes Program Recommendations  AACE/ADA: New Consensus Statement on Inpatient Glycemic Control (2015)  Target Ranges:  Prepandial:   less than 140 mg/dL      Peak postprandial:   less than 180 mg/dL (1-2 hours)      Critically ill patients:  140 - 180 mg/dL   Lab Results  Component Value Date   GLUCAP 88 07/16/2020   HGBA1C 10.8 (H) 07/11/2020    Review of Glycemic Control Results for Hector Mueller, Hector Mueller (MRN [id number]) as of 07/16/2020 09:25  Ref. Range 07/15/2020 11:22 07/15/2020 15:33 07/15/2020 19:22 07/15/2020 23:15 07/16/2020 04:56 07/16/2020 05:41 07/16/2020 07:23 07/16/2020 09:13  Glucose-Capillary Latest Ref Range: 70 - 99 mg/dL 117 (H) 249 (H) 211 (H) 106 (H) 63 (L) 115 (H) 65 (L) 88   Diabetes history: DM Outpatient Diabetes medications: Lantus 12 units qd + Novolog 6 units tid meal coverage Current orders for Inpatient glycemic control: Levemir 30 units bid + Novolog 0-15 units q 4 hrs.  Inpatient Diabetes Program Recommendations:   Noted hypoglycemia. Consider: -Decrease Levemir to 25 units bid -Decrease Novolog correction to 0-9 units   Thank you, Bethena Roys E. Hudsen Fei, RN, MSN, CDE  Diabetes Coordinator Inpatient Glycemic Control Team Team Pager [phone] (8am-5pm) 07/16/2020 9:32 AM

## 2020-07-16 NOTE — Progress Notes (Addendum)
[place] for heparin infusion and bridge to warfarin Indication: atrial fibrillation  No Known Allergies  Patient Measurements: Height: 6' (182.9 cm) Weight: 78.5 kg (173 lb 1 oz) IBW/kg (Calculated) : 77.6  Vital Signs: BP: 165/85 (05/05 0800) Pulse Rate: 74 (05/05 0800)  Labs: Recent Labs    07/13/20 1224 07/13/20 1400 07/13/20 1702 07/14/20 0200 07/14/20 0826 07/14/20 2334 07/15/20 0203 07/15/20 0500 07/15/20 1048 07/16/20 0139 07/16/20 0459  HGB  --   --    < > 11.3*   < > 9.9*  --  10.4*  --   --  10.6*  HCT  --   --    < > 33.2*   < > 28.6*  --  29.5*  --   --  31.1*  PLT  --   --   --  99*  --   --   --  94*  --   --  121*  LABPROT  --   --   --   --   --   --   --   --   --   --  14.2  INR  --   --   --   --   --   --   --   --   --   --  1.1  HEPARINUNFRC  --   --    < > 0.35   < >  --  0.36  --  0.19* 0.30  --   CREATININE  --   --   --  2.85*  --   --   --  2.60*  --   --  2.42*  TROPONINIHS 32* 34*  --   --   --   --   --   --   --   --   --    < > = values in this interval not displayed.    Estimated Creatinine Clearance: 30.3 mL/min (A) (by C-G formula based on SCr of 2.42 mg/dL (H)).   Medical History: Past Medical History:  Diagnosis Date  . Atrial fibrillation ([place])   . BPH (benign prostatic hyperplasia)   . Cancer ([place])    liver  . Cirrhosis ([place])   . Diabetes mellitus without complication ([place])   . GERD (gastroesophageal reflux disease)   . Hepatitis C     Assessment: 70 y/o M with medical history including cirrhosis / liver cancer s/p liver transplant in 2018, pulmonary arterial hypertension, diabetes, Afib, BPH, substance abuse who is admitted with acute hypoxic respiratory failure secondary to acute encephalopathy / possible CVA and hyperglycemic emergency / AKI. Pt not on anticoagulation prior to admission. reports of coffee ground emesis on 4/29 and has since resolved. Pharmacy has been consulted  for heparin dosing and monitoring for afib and bridge to warfarin. Hgb 12.1, Hct 35.2; aPTT/PT/INR wnl.  Dosing by wt of 77.6 kg  Pt also taking amiodarone which can increase effects of warfarin. Will be monitored daily.  Heparin Date Time HL Rate/Comment 5/2 1702 0.29 Slightly < goal; 1200>>1350 un/hr 5/2 0200 0.35 Therapeutic x 1; 1350 un/hr 5/3 0826 0.32 Therapeutic x2; 1350 un/hr 5/3 1430 0.24 Subtherapeutic; 1350>> 5/4 0203 0.36 Therapeutic x 1 5/4 1048 0.19 Subtherapeutic, spoke with nurse who stated infusion had been off for ~1.5 hours 5/5 0139 0.30 Therapeutic x 1 5/5 0930 0.41 Therapeutic x2  Warfarin  Date Time INR Dose 5/1 0932 1.1 Baseline INR 5/5 0459 1.1  Goal of Therapy:  Heparin level 0.3-0.7 units/ml Monitor platelets by anticoagulation protocol: Yes   Plan:   HL therapeutic, will continue heparin infusion at 1500 units/hr   Will recheck HL with AM labs  INR 1.1 - will give 7.5mg  warfarin again tonight   Re-check INR with AM labs  Continue to monitor H&H and platelets   Sherilyn Banker, PharmD Pharmacy Resident  07/16/2020 11:39 AM

## 2020-07-16 NOTE — Consult Note (Addendum)
PHARMACY CONSULT NOTE  Pharmacy Consult for Electrolyte Monitoring and Replacement   Recent Labs: Potassium (mmol/L)  Date Value  07/16/2020 3.1 (L)   Magnesium (mg/dL)  Date Value  07/16/2020 1.9   Calcium (mg/dL)  Date Value  07/16/2020 7.7 (L)   Albumin (g/dL)  Date Value  07/10/2020 4.7   Phosphorus (mg/dL)  Date Value  07/16/2020 3.1   Sodium (mmol/L)  Date Value  07/16/2020 139   Assessment: Patient is a 70 y/o M with medical history including cirrhosis / liver cancer s/p liver transplant in 2018, pulmonary arterial hypertension, diabetes, Afib, BPH, substance abuse who is admitted with acute hypoxic respiratory failure secondary to acute encephalopathy / possible CVA and hyperglycemic emergency / AKI. Pharmacy has been consulted for electrolyte management.   Patient is currently intubated, sedated, and on mechanical ventilation in the ICU. 30% FiO2. Renal dysfunction persistent despite fluid resuscitation. Requiring Levophed for blood pressure support. Patient transitioned off of IV insulin to SQ insulin. Patient on amiodarone drip.   PROSource 60mL QID Vital High Protein @55mL /hr  Goal of Therapy:  K 4-5.1 mmol/L Mg 2-2.4 mg/dL All other electrolytes within normal limits  Plan:  --K 3.1 despite replacement with 38mEq x5 runs 5/4; will replace with 80mEq IV Kcl x5 runs --Mg 1.9 - will replace with 2g IV Mg sulfate  --Follow-up electrolytes with AM labs   Sherilyn Banker, PharmD Pharmacy Resident  07/16/2020 6:39 AM

## 2020-07-16 NOTE — Progress Notes (Signed)
Progress Note  Patient Name: Hector Mueller Date of Encounter: 07/16/2020  Primary Cardiologist: [facility]  Subjective   No complaints, ate dinner and breakfast Appropriate on my interaction, Nursing notes indicate  was confused hallucinating most of the night but able to be reoriented, Loose bowel movements, glucose 63, orange juice at the bedside Telemetry reviewed, frequent accelerated idioventricular rhythm noted  Inpatient Medications    Scheduled Meds: . amiodarone  400 mg Oral BID  . Chlorhexidine Gluconate Cloth  6 each Topical QHS  . cycloSPORINE modified  200 mg Oral Daily   And  . cycloSPORINE modified  175 mg Oral QHS  . docusate  100 mg Per Tube BID  . DULoxetine  30 mg Oral Daily  . feeding supplement  237 mL Oral BID BM  . insulin aspart  0-9 Units Subcutaneous Q4H  . insulin glargine  25 Units Subcutaneous BID  . mouth rinse  15 mL Mouth Rinse BID  . metoprolol succinate  12.5 mg Oral QHS  . mirtazapine  15 mg Oral QHS  . multivitamin with minerals  1 tablet Oral Daily  . mycophenolate  500 mg Oral BID  . pantoprazole (PROTONIX) IV  40 mg Intravenous Q12H  . polyethylene glycol  17 g Per Tube Daily  . QUEtiapine  25 mg Oral QHS  . rosuvastatin  5 mg Oral QPM  . Warfarin - Pharmacist Dosing Inpatient   Does not apply q1600   Continuous Infusions: . sodium chloride 10 mL/hr at 07/16/20 0500  . heparin 1,500 Units/hr (07/16/20 0949)  . potassium chloride 10 mEq (07/16/20 0954)   PRN Meds: albuterol, docusate sodium, ondansetron (ZOFRAN) IV, polyethylene glycol, polyvinyl alcohol, traZODone   Vital Signs    Vitals:   07/16/20 0500 07/16/20 0600 07/16/20 0700 07/16/20 0800  BP: (!) 153/71 (!) 172/66 (!) 160/72 (!) 165/85  Pulse: 83 82 72 74  Resp: 18 14 12 16   Temp:      TempSrc:      SpO2: 99% 98% 100% 98%  Weight:      Height:        Intake/Output Summary (Last 24 hours) at 07/16/2020 1021 Last data filed at 07/16/2020 0545 Gross per 24 hour   Intake 1125.28 ml  Output 1270 ml  Net -144.72 ml   Filed Weights   07/12/20 0500 07/13/20 0500 07/14/20 0500  Weight: 75.8 kg 81.9 kg 78.5 kg    Telemetry    SR with PVCs and accelerated idioventricular- Personally Reviewed  ECG    No new tracings - Personally Reviewed  Physical Exam   GEN: No acute distress.   Neck: No JVD. Cardiac: RRR, no murmurs, rubs, or gallops.  Respiratory: Clear to auscultation bilaterally.  GI: Soft, nontender, non-distended.   MS: No edema; No deformity. Neuro:  Alert and oriented x 3; Nonfocal.  Psych: Normal affect.  Labs    Chemistry Recent Labs  Lab 07/10/20 2109 07/11/20 0316 07/14/20 0200 07/14/20 1630 07/15/20 0500 07/16/20 0459  NA 133*   < > 132*  --  138 139  K 4.9   < > 4.0 3.6 3.2* 3.1*  CL 81*   < > 100  --  106 109  CO2 25   < > 22  --  24 22  GLUCOSE 667*   < > 451*  --  123* 66*  BUN 53*   < > 46*  --  46* 43*  CREATININE 3.35*   < > 2.85*  --  2.60* 2.42*  CALCIUM 10.7*   < > 7.7*  --  8.4* 7.7*  PROT 7.9  --   --   --   --   --   ALBUMIN 4.7  --   --   --   --   --   AST 14*  --   --   --   --   --   ALT 11  --   --   --   --   --   ALKPHOS 88  --   --   --   --   --   BILITOT 1.7*  --   --   --   --   --   GFRNONAA 19*   < > 23*  --  25* 28*  ANIONGAP 27*   < > 10  --  8 8   < > = values in this interval not displayed.     Hematology Recent Labs  Lab 07/14/20 0200 07/14/20 0826 07/14/20 2334 07/15/20 0500 07/16/20 0459  WBC 7.2  --   --  4.8 4.5  RBC 3.71*  --   --  3.37* 3.47*  HGB 11.3*   < > 9.9* 10.4* 10.6*  HCT 33.2*   < > 28.6* 29.5* 31.1*  MCV 89.5  --   --  87.5 89.6  MCH 30.5  --   --  30.9 30.5  MCHC 34.0  --   --  35.3 34.1  RDW 13.2  --   --  13.1 13.8  PLT 99*  --   --  94* 121*   < > = values in this interval not displayed.    Cardiac EnzymesNo results for input(s): TROPONINI in the last 168 hours. No results for input(s): TROPIPOC in the last 168 hours.   BNPNo results for  input(s): BNP, PROBNP in the last 168 hours.   DDimer No results for input(s): DDIMER in the last 168 hours.   Radiology    DG Chest Port 1 View  Result Date: 07/15/2020 IMPRESSION: Increasingly patchy and coalescent opacities in the mid to lower lungs. Could reflect developing airspace disease, worsening atelectasis or edema. Electronically Signed   By: Lovena Le M.D.   On: 07/15/2020 03:37   [place] ASCITES (ABDOMEN LIMITED)  Result Date: 07/14/2020 IMPRESSION: 1. No evidence of ascites. Electronically Signed   By: Randa Ngo M.D.   On: 07/14/2020 20:38    Cardiac Studies   2D echo 07/13/2020: 1. Left ventricular ejection fraction, by estimation, is 40 to 45%. The  left ventricle has mild to moderately decreased function. The left  ventricle demonstrates global hypokinesis. Left ventricular diastolic  parameters are consistent with Grade I  diastolic dysfunction (impaired relaxation).  2. Right ventricular systolic function is normal. The right ventricular  size is normal.  3. The mitral valve is grossly normal. No evidence of mitral valve  regurgitation.  4. The aortic valve was not well visualized. Aortic valve regurgitation  is not visualized.  5. Pulmonic valve regurgitation not assessed.  Patient Profile     70 y.o. male with history of SSS s/p Micra pacemaker in 2018, persistent Afib previously on amiodarone and warfarin but stopped by EP team at [facility] in 2019 in setting of no recurrent a-fib and CHADSVASC score of 1, pulmonary hypertensionb, cirrhosis s/p liver transplant in 2018, DM, and HLD admitted with acute hypoxic respiratory failure secondary to AMS in the setting of HHS who we are seeing for  Afib with RVR with associated shock requiring emergent cardioversion.   Assessment & Plan    1. Afib with RVR; -Status post emergent DCCV -Maintaining sinus rhythm  -Amiodarone 400 mg bid for a total of 7 days followed by 200 mg bid for 7 days, then 200 mg daily thereafter  for now -Heparin to Coumadin bridge  Accelerated idioventricular rhythm on telemetry -Increase metoprolol succinate up to 25 twice daily with hold parameters  2. Cardiomyopathy/elevated HS-Tn: -EF 40-45% Will need ischemic work-up at some point, could be considered outpatient  Will increase Toprol XL as above -Not currently on ACEi/ARB/ARNI/MRA/SGLT2i in the context of CKD  3. CKD stage IV: Slightly above his baseline Unable to exclude component of ATN given the stressors above  4. AMS: -Head CT without acute infarct  -Mentating better  5. Hypokalemia: -Being repleted IV  6. Anemia: -Stable -Monitor on heparin to Coumadin bridge    Total encounter time more than 35 minutes  Greater than 50% was spent in counseling and coordination of care with the patient     For questions or updates, please contact [place] Please consult [email or web address] for contact info under Cardiology/STEMI.    Signed, Christell Faith, PA-C [place] Pager: [phone] 07/16/2020, 10:21 AM

## 2020-07-16 NOTE — Progress Notes (Signed)
Pt confused and hallucinating most of night, but easily reoriented. Pt went to sleep around 2am and slept a couple hours. Pt was able to have two loose BM overnight, bedtime stool softener held. Early morning BG-63. Hypoglycemic protocol followed. VSS stable throughout night, having frequent PVCs.

## 2020-07-16 NOTE — Progress Notes (Signed)
PROGRESS NOTE    Hector Mueller  FFM:[id number] DOB: 06-Nov-1950 DOA: 07/10/2020 PCP: [name], MD   Brief Narrative:  70 year old male who was brought to the [facility] ED due to altered mental status.  Per ED documentation the patient was reportedly in his usual state of health last night on 07/09/20 until about 11:30 PM.  This morning the patient was noted to have difficulty walking, but was talking and interacting normally with family.  However today at noon he was found to be unable to stand or move purposefully as well as being unable to speak. Extubated successfully on 5/3.  Mental status returning to baseline on 5/4.  Patient status post DC cardioversion on 5/2 with successful restoration of sinus rhythm.  Seen by cardiology and converted to p.o. amiodarone.  Seen by SLP and diet advanced as well. 5/5: Per nursing patient had some hallucinations and altered mentation overnight.  Endorsed poor sleep.  Was appropriate with me this morning.  Does endorse some lethargy.   Assessment & Plan:   Active Problems:   Acute respiratory failure (HCC)   Atrial fibrillation (HCC)   HFrEF (heart failure with reduced ejection fraction) (HCC)  Acute Hypoxic Respiratory Failure secondary to acute encephalopathy in the setting of HHS Acute stroke was ruled out Acute encephalopathy, multifactorial. metabolic/ suspected CVA Poorly controlled diabetes type 2 presented with HHS and now again with hyperglycemia CT imaging survey negative for acute stroke/LVO.  MRI not possible due to pacer.  Mental status back at baseline.  Tolerated extubation well.  Suspected etiology of encephalopathy and airway compromise due to HHS. Plan: Diabetes coordinator for insulin management Oxygen as necessary Monitor vitals Transfer to PCU  Paroxysmal A. fib status post cardioversion Status post DC cardioversion on 5/2.  Tolerated well.  Seen by cardiology this morning.  Transition to p.o. amiodarone. Plan: Started on  warfarin.  INR remains subtherapeutic Continue heparin GTT bridge until INR greater than 2  Acute systolic congestive heart failure Echocardiogram was done which showed EF 40 to 45% He will need to be started on beta-blocker/ACE once blood pressure improves Defer to cardiology regarding ischemic work-up.  Could be deferred outpatient  Acute Kidney Injury on CKD Baseline Cr: 1.6, Cr on admission: 3.35, likely prerenal Suspect prerenal azotemia in the setting of respiratory compromise.  Creatinine improving.  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 No more coffee-ground emesis H&H remained stable Continue cyclosporine and mycophenolate at home Follow cyclosporine level Monitor for signs and symptoms of bleeding Transfuse if hemoglobin less than 7  Pulmonary arterial hypertension Who class I Hold sildenafil due to hypotension Continuous cardiac monitoring   DVT prophylaxis: Heparin GTT Code Status: Full Family Communication: Spouse at bedside 5/4 Disposition Plan: Status is: Inpatient  Remains inpatient appropriate because:Inpatient level of care appropriate due to severity of illness   Dispo: The patient is from: Home              Anticipated d/c is to: SNF versus home with home health              Patient currently is not medically stable to d/c.   Difficult to place patient No  Respiratory failure resolved.  Continues to have frequent PVCs.  Stable for transfer to PCU.  Disposition plan pending     Level of care: Progressive Cardiac  Consultants:   Cardiology  Procedures:   DC cardioversion  Endotracheal intubation  Antimicrobials:  None   Subjective: Patient seen and examined.  Resting in bed.  Visibly no distress.  No pain complaints.  Objective: Vitals:   07/16/20 0600 07/16/20 0700 07/16/20 0800 07/16/20 1200  BP: (!) 172/66 (!) 160/72 (!) 165/85 (!) 169/80   Pulse: 82 72 74 82  Resp: 14 12 16 14   Temp:      TempSrc:      SpO2: 98% 100% 98% 100%  Weight:      Height:        Intake/Output Summary (Last 24 hours) at 07/16/2020 1457 Last data filed at 07/16/2020 0545 Gross per 24 hour  Intake 529 ml  Output 1270 ml  Net -741 ml   Filed Weights   07/12/20 0500 07/13/20 0500 07/14/20 0500  Weight: 75.8 kg 81.9 kg 78.5 kg    Examination:  General exam: No acute distress.  Appears fatigued.  Hoarse voice Respiratory system: Mild scattered crackles bilaterally.  Normal work of breathing.  Room air Cardiovascular system: S1-S2.  Regular rate and rhythm.  No murmurs.  No pedal edema Gastrointestinal system: Abdomen is nondistended, soft and nontender. No organomegaly or masses felt. Normal bowel sounds heard. Central nervous system: Alert and oriented. No focal neurological deficits. Extremities: Symmetric 5 x 5 power. Skin: No rashes, lesions or ulcers Psychiatry: Judgement and insight appear impaired. Mood & affect flattened.     Data Reviewed: I have personally reviewed following labs and imaging studies  CBC: Recent Labs  Lab 07/10/20 2109 07/11/20 0316 07/12/20 0140 07/12/20 0433 07/13/20 0318 07/13/20 1004 07/14/20 0200 07/14/20 0826 07/14/20 1630 07/14/20 2334 07/15/20 0500 07/16/20 0459  WBC 13.6*   < > 11.9*  --  6.6  --  7.2  --   --   --  4.8 4.5  NEUTROABS 12.3*  --   --   --  5.0  --   --   --   --   --   --   --   HGB 16.0   < > 12.3*   < > 11.8*   < > 11.3* 10.1* 10.2* 9.9* 10.4* 10.6*  HCT 46.1   < > 35.0*   < > 34.7*   < > 33.2* 29.9* 29.5* 28.6* 29.5* 31.1*  MCV 87.8   < > 87.1  --  89.4  --  89.5  --   --   --  87.5 89.6  PLT 128*   < > 113*  --  119*  --  99*  --   --   --  94* 121*   < > = values in this interval not displayed.   Basic Metabolic Panel: Recent Labs  Lab 07/12/20 0140 07/12/20 0932 07/13/20 0317 07/13/20 1702 07/14/20 0200 07/14/20 1630 07/15/20 0500 07/16/20 0459  NA 137  --   133*  --  132*  --  138 139  K 3.6  --  4.1  --  4.0 3.6 3.2* 3.1*  CL 99  --  99  --  100  --  106 109  CO2 26  --  22  --  22  --  24 22  GLUCOSE 170*  --  292*  --  451*  --  123* 66*  BUN 46*  --  42*  --  46*  --  46* 43*  CREATININE 2.85*  --  2.66*  --  2.85*  --  2.60* 2.42*  CALCIUM 9.0  --  8.0*  --  7.7*  --  8.4* 7.7*  MG 1.8   < > 1.9 2.3 2.3  --  2.1 1.9  PHOS <1.0*   < > 3.6 3.2 2.6  --  2.3* 3.1   < > = values in this interval not displayed.   GFR: Estimated Creatinine Clearance: 30.3 mL/min (A) (by C-G formula based on SCr of 2.42 mg/dL (H)). Liver Function Tests: Recent Labs  Lab 07/10/20 2109  AST 14*  ALT 11  ALKPHOS 88  BILITOT 1.7*  PROT 7.9  ALBUMIN 4.7   No results for input(s): LIPASE, AMYLASE in the last 168 hours. Recent Labs  Lab 07/11/20 0316 07/13/20 0842  AMMONIA 23 <9*   Coagulation Profile: Recent Labs  Lab 07/10/20 2109 07/12/20 0932 07/16/20 0459  INR 1.0 1.1 1.1   Cardiac Enzymes: No results for input(s): CKTOTAL, CKMB, CKMBINDEX, TROPONINI in the last 168 hours. BNP (last 3 results) No results for input(s): PROBNP in the last 8760 hours. HbA1C: No results for input(s): HGBA1C in the last 72 hours. CBG: Recent Labs  Lab 07/16/20 0456 07/16/20 0541 07/16/20 0723 07/16/20 0913 07/16/20 1114  GLUCAP 63* 115* 65* 88 96   Lipid Profile: Recent Labs    07/14/20 0039  TRIG 215*   Thyroid Function Tests: No results for input(s): TSH, T4TOTAL, FREET4, T3FREE, THYROIDAB in the last 72 hours. Anemia Panel: No results for input(s): VITAMINB12, FOLATE, FERRITIN, TIBC, IRON, RETICCTPCT in the last 72 hours. Sepsis Labs: Recent Labs  Lab 07/10/20 2155 07/10/20 2355 07/11/20 0316 07/12/20 0140 07/13/20 0318  PROCALCITON  --   --  0.24 0.57 2.95  LATICACIDVEN 3.2* 3.8*  --   --   --     Recent Results (from the past 240 hour(s))  Resp Panel by RT-PCR (Flu A&B, Covid) Nasopharyngeal Swab     Status: None   Collection  Time: 07/10/20 10:05 PM   Specimen: Nasopharyngeal Swab; Nasopharyngeal(NP) swabs in vial transport medium  Result Value Ref Range Status   SARS Coronavirus 2 by RT PCR NEGATIVE NEGATIVE Final    Comment: (NOTE) SARS-CoV-2 target nucleic acids are NOT DETECTED.  The SARS-CoV-2 RNA is generally detectable in upper respiratory specimens during the acute phase of infection. The lowest concentration of SARS-CoV-2 viral copies this assay can detect is 138 copies/mL. A negative result does not preclude SARS-Cov-2 infection and should not be used as the sole basis for treatment or other patient management decisions. A negative result may occur with  improper specimen collection/handling, submission of specimen other than nasopharyngeal swab, presence of viral mutation(s) within the areas targeted by this assay, and inadequate number of viral copies(<138 copies/mL). A negative result must be combined with clinical observations, patient history, and epidemiological information. The expected result is Negative.  Fact Sheet for Patients:  EntrepreneurPulse.com.au  Fact Sheet for Healthcare Providers:  IncredibleEmployment.be  This test is no t yet approved or cleared by the [place] FDA and  has been authorized for detection and/or diagnosis of SARS-CoV-2 by FDA under an Emergency Use Authorization (EUA). This EUA will remain  in effect (meaning this test can be used) for the duration of the COVID-19 declaration under Section 564(b)(1) of the Act, 21 U.S.C.section 360bbb-3(b)(1), unless the authorization is terminated  or revoked sooner.       Influenza A by PCR NEGATIVE NEGATIVE Final   Influenza B by PCR NEGATIVE NEGATIVE Final    Comment: (NOTE) The Xpert Xpress SARS-CoV-2/FLU/RSV plus assay is intended as an aid in the diagnosis of influenza  from Nasopharyngeal swab specimens and should not be used as a sole basis for treatment. Nasal washings  and aspirates are unacceptable for Xpert Xpress SARS-CoV-2/FLU/RSV testing.  Fact Sheet for Patients: EntrepreneurPulse.com.au  Fact Sheet for Healthcare Providers: IncredibleEmployment.be  This test is not yet approved or cleared by the [place] FDA and has been authorized for detection and/or diagnosis of SARS-CoV-2 by FDA under an Emergency Use Authorization (EUA). This EUA will remain in effect (meaning this test can be used) for the duration of the COVID-19 declaration under Section 564(b)(1) of the Act, 21 U.S.C. section 360bbb-3(b)(1), unless the authorization is terminated or revoked.  Performed at [facility], [address] [place], [place] [address]   Blood culture (single)     Status: None   Collection Time: 07/10/20 11:53 PM   Specimen: BLOOD  Result Value Ref Range Status   Specimen Description BLOOD RIGHT [facility]   Final   Special Requests   Final    BOTTLES DRAWN AEROBIC AND ANAEROBIC Blood Culture adequate volume   Culture   Final    NO GROWTH 5 DAYS Performed at [facility], [address] [place], [place] [address]    Report Status 07/16/2020 FINAL  Final  MRSA PCR Screening     Status: None   Collection Time: 07/11/20  2:00 AM   Specimen: Nasopharyngeal  Result Value Ref Range Status   MRSA by PCR NEGATIVE NEGATIVE Final    Comment:        The GeneXpert MRSA Assay (FDA approved for NASAL specimens only), is one component of a comprehensive MRSA colonization surveillance program. It is not intended to diagnose MRSA infection nor to guide or monitor treatment for MRSA infections. Performed at [facility], [address] [place], [place] [address]   Culture, Respiratory w Gram Stain     Status: None   Collection Time: 07/12/20 10:41 AM   Specimen: Tracheal Aspirate; Respiratory  Result Value Ref Range Status   Specimen Description   Final    TRACHEAL ASPIRATE Performed at  [facility], [address] [place], [place] [address]    Special Requests   Final    NONE Performed at [facility], [address] [place], [place] [address]    Gram Stain   Final    RARE WBC PRESENT, PREDOMINANTLY PMN NO ORGANISMS SEEN    Culture   Final    Normal respiratory flora-no Staph aureus or Pseudomonas seen Performed at [facility] [address], [place],  [address]    Report Status 07/14/2020 FINAL  Final         Radiology Studies: DG Chest Port 1 View  Result Date: 07/15/2020 CLINICAL DATA:  Acute respiratory failure with hypoxia EXAM: PORTABLE CHEST 1 VIEW COMPARISON:  Radiograph 07/12/2020 FINDINGS: Right IJ approach central venous catheter tip terminates at the mid to lower SVC. Interval removal of previously seen transesophageal and endotracheal tubes. Implantable loop recorder projects over the left chest wall. Thoracolumbar fusion hardware is again seen, incompletely assessed. Telemetry leads and external support devices overlie the chest wall. Increasingly coalescent opacities present in the mid to lower lungs. No pneumothorax. No visible effusion. Stable cardiomediastinal contours with a calcified aorta. No acute osseous or soft tissue abnormality. Degenerative changes are present in the imaged spine and shoulders. IMPRESSION: Increasingly patchy and coalescent opacities in the mid to lower lungs. Could reflect developing airspace disease, worsening atelectasis or edema. Electronically Signed   By:  Lovena Le M.D.   On: 07/15/2020 03:37   [place] ASCITES (ABDOMEN LIMITED)  Result Date: 07/14/2020 CLINICAL DATA:  Abdominal distension EXAM: LIMITED ABDOMEN ULTRASOUND FOR ASCITES TECHNIQUE: Limited ultrasound survey for ascites was performed in all four abdominal quadrants. COMPARISON:  06/18/2020 FINDINGS: 4 quadrant ultrasound was performed to assess for ascites. There is no ascites within the abdomen. IMPRESSION: 1. No evidence of  ascites. Electronically Signed   By: Randa Ngo M.D.   On: 07/14/2020 20:38        Scheduled Meds: . amiodarone  400 mg Oral BID  . Chlorhexidine Gluconate Cloth  6 each Topical QHS  . cycloSPORINE modified  200 mg Oral Daily   And  . cycloSPORINE modified  175 mg Oral QHS  . docusate  100 mg Per Tube BID  . DULoxetine  30 mg Oral Daily  . feeding supplement  237 mL Oral BID BM  . insulin aspart  0-9 Units Subcutaneous Q4H  . insulin glargine  25 Units Subcutaneous BID  . mouth rinse  15 mL Mouth Rinse BID  . metoprolol succinate  25 mg Oral BID  . mirtazapine  15 mg Oral QHS  . multivitamin with minerals  1 tablet Oral Daily  . mycophenolate  500 mg Oral BID  . pantoprazole (PROTONIX) IV  40 mg Intravenous Q12H  . polyethylene glycol  17 g Per Tube Daily  . QUEtiapine  25 mg Oral QHS  . rosuvastatin  5 mg Oral QPM  . warfarin  7.5 mg Oral ONCE-1600  . Warfarin - Pharmacist Dosing Inpatient   Does not apply q1600   Continuous Infusions: . sodium chloride 10 mL/hr at 07/16/20 0500  . heparin 1,500 Units/hr (07/16/20 0949)     LOS: 5 days    Time spent: 25 minutes    Sidney Ace, MD [facility] Pager [phone]  If 7PM-7AM, please contact night-coverage 07/16/2020, 2:57 PM

## 2020-07-17 DIAGNOSIS — J9601 Acute respiratory failure with hypoxia: Secondary | ICD-10-CM | POA: Diagnosis not present

## 2020-07-17 LAB — BASIC METABOLIC PANEL
Anion gap: 8 (ref 5–15)
Anion gap: 8 (ref 5–15)
BUN: 42 mg/dL — ABNORMAL HIGH (ref 8–23)
BUN: 42 mg/dL — ABNORMAL HIGH (ref 8–23)
CO2: 23 mmol/L (ref 22–32)
CO2: 23 mmol/L (ref 22–32)
Calcium: 8.8 mg/dL — ABNORMAL LOW (ref 8.9–10.3)
Calcium: 8.6 mg/dL — ABNORMAL LOW (ref 8.9–10.3)
Chloride: 108 mmol/L (ref 98–111)
Chloride: 107 mmol/L (ref 98–111)
Creatinine, Ser: 2.69 mg/dL — ABNORMAL HIGH (ref 0.61–1.24)
Creatinine, Ser: 2.63 mg/dL — ABNORMAL HIGH (ref 0.61–1.24)
GFR, Estimated: 24 mL/min — ABNORMAL LOW (ref >60)
GFR, Estimated: 25 mL/min — ABNORMAL LOW (ref >60)
Glucose, Bld: 77 mg/dL (ref 70–99)
Glucose, Bld: 77 mg/dL (ref 70–99)
Potassium: 4.6 mmol/L (ref 3.5–5.1)
Potassium: 4.5 mmol/L (ref 3.5–5.1)
Sodium: 139 mmol/L (ref 135–145)
Sodium: 138 mmol/L (ref 135–145)

## 2020-07-17 LAB — CBC
HCT: 32.8 % — ABNORMAL LOW (ref 39.0–52.0)
HCT: 32.9 % — ABNORMAL LOW (ref 39.0–52.0)
Hemoglobin: 11 g/dL — ABNORMAL LOW (ref 13.0–17.0)
Hemoglobin: 11.1 g/dL — ABNORMAL LOW (ref 13.0–17.0)
MCH: 30.6 pg (ref 26.0–34.0)
MCH: 30.3 pg (ref 26.0–34.0)
MCHC: 33.5 g/dL (ref 30.0–36.0)
MCHC: 33.7 g/dL (ref 30.0–36.0)
MCV: 91.4 fL (ref 80.0–100.0)
MCV: 89.9 fL (ref 80.0–100.0)
Platelets: 138 10*3/uL — ABNORMAL LOW (ref 150–400)
Platelets: 143 10*3/uL — ABNORMAL LOW (ref 150–400)
RBC: 3.59 MIL/uL — ABNORMAL LOW (ref 4.22–5.81)
RBC: 3.66 MIL/uL — ABNORMAL LOW (ref 4.22–5.81)
RDW: 14.5 % (ref 11.5–15.5)
RDW: 14.6 % (ref 11.5–15.5)
WBC: 6.9 10*3/uL (ref 4.0–10.5)
WBC: 6.9 10*3/uL (ref 4.0–10.5)
nRBC: 0 % (ref 0.0–0.2)
nRBC: 0 % (ref 0.0–0.2)

## 2020-07-17 LAB — GLUCOSE, CAPILLARY
Glucose-Capillary: 77 mg/dL (ref 70–99)
Glucose-Capillary: 63 mg/dL — ABNORMAL LOW (ref 70–99)
Glucose-Capillary: 64 mg/dL — ABNORMAL LOW (ref 70–99)
Glucose-Capillary: 65 mg/dL — ABNORMAL LOW (ref 70–99)
Glucose-Capillary: 75 mg/dL (ref 70–99)
Glucose-Capillary: 147 mg/dL — ABNORMAL HIGH (ref 70–99)
Glucose-Capillary: 143 mg/dL — ABNORMAL HIGH (ref 70–99)
Glucose-Capillary: 119 mg/dL — ABNORMAL HIGH (ref 70–99)

## 2020-07-17 LAB — PROTIME-INR
INR: 2.1 — ABNORMAL HIGH (ref 0.8–1.2)
Prothrombin Time: 23.3 seconds — ABNORMAL HIGH (ref 11.4–15.2)

## 2020-07-17 LAB — HEPARIN LEVEL (UNFRACTIONATED): Heparin Unfractionated: 0.32 IU/mL (ref 0.30–0.70)

## 2020-07-17 LAB — MAGNESIUM: Magnesium: 2.5 mg/dL — ABNORMAL HIGH (ref 1.7–2.4)

## 2020-07-17 LAB — PHOSPHORUS: Phosphorus: 3.6 mg/dL (ref 2.5–4.6)

## 2020-07-17 MED ORDER — SILDENAFIL CITRATE 20 MG PO TABS
40.0000 mg | ORAL_TABLET | Freq: Three times a day (TID) | ORAL | Status: DC
  Administered 2020-07-17 – 2020-07-20 (×8): 40 mg via ORAL
  Filled 2020-07-17 (×10): qty 2

## 2020-07-17 MED ORDER — PANTOPRAZOLE SODIUM 40 MG PO TBEC
40.0000 mg | DELAYED_RELEASE_TABLET | Freq: Every day | ORAL | Status: DC
  Administered 2020-07-18 – 2020-07-20 (×3): 40 mg via ORAL
  Filled 2020-07-17 (×3): qty 1

## 2020-07-17 MED ORDER — POLYETHYLENE GLYCOL 3350 17 G PO PACK
17.0000 g | PACK | Freq: Every day | ORAL | Status: DC
  Administered 2020-07-19: 17 g via ORAL
  Filled 2020-07-17 (×2): qty 1

## 2020-07-17 MED ORDER — INSULIN GLARGINE 100 UNIT/ML ~~LOC~~ SOLN
20.0000 [IU] | Freq: Two times a day (BID) | SUBCUTANEOUS | Status: DC
  Filled 2020-07-17 (×3): qty 0.2

## 2020-07-17 MED ORDER — INSULIN GLARGINE 100 UNIT/ML ~~LOC~~ SOLN
20.0000 [IU] | Freq: Every day | SUBCUTANEOUS | Status: DC
  Administered 2020-07-18: 20 [IU] via SUBCUTANEOUS
  Filled 2020-07-17 (×2): qty 0.2

## 2020-07-17 MED ORDER — DOCUSATE SODIUM 100 MG PO CAPS
100.0000 mg | ORAL_CAPSULE | Freq: Two times a day (BID) | ORAL | Status: DC
  Administered 2020-07-17 – 2020-07-20 (×5): 100 mg via ORAL
  Filled 2020-07-17 (×6): qty 1

## 2020-07-17 MED ORDER — WARFARIN SODIUM 2.5 MG PO TABS
2.5000 mg | ORAL_TABLET | Freq: Once | ORAL | Status: AC
  Administered 2020-07-17: 2.5 mg via ORAL
  Filled 2020-07-17: qty 1

## 2020-07-17 NOTE — TOC Progression Note (Signed)
Transition of Care [facility]) - Progression Note    Patient Details  Name: Hector Mueller MRN: [id number] Date of Birth: [birth year]  [facility] CM/SW [place], RN Phone Number: 07/17/2020, 4:28 PM  Clinical Narrative:  Spoke with patient about SNF recommendation by PT/OT, patient receptive to placement. Discussed preference patient says it does not matter. Nurse came to the room for medication dispensing, I advised patient that we will start process and keep him posted on acceptance results.          Expected Discharge Plan and Services                                                 Social Determinants of Health (SDOH) Interventions    Readmission Risk Interventions No flowsheet data found.

## 2020-07-17 NOTE — Progress Notes (Signed)
[place] for heparin infusion and bridge to warfarin Indication: atrial fibrillation  Patient Measurements: Height: 6' (182.9 cm) Weight: 89.4 kg (197 lb 1.5 oz) IBW/kg (Calculated) : 77.6   Labs: Recent Labs    07/15/20 0500 07/15/20 1048 07/16/20 0139 07/16/20 0459 07/16/20 0930 07/17/20 0825 07/17/20 0832  HGB 10.4*  --   --  10.6*  --  11.0* 11.1*  HCT 29.5*  --   --  31.1*  --  32.8* 32.9*  PLT 94*  --   --  121*  --  138* 143*  LABPROT  --   --   --  14.2  --  23.3*  --   INR  --   --   --  1.1  --  2.1*  --   HEPARINUNFRC  --    < > 0.30  --  0.41 0.32  --   CREATININE 2.60*  --   --  2.42*  --   --  2.63*   < > = values in this interval not displayed.    Estimated Creatinine Clearance: 27.9 mL/min (A) (by C-G formula based on SCr of 2.63 mg/dL (H)).   Medical History: Past Medical History:  Diagnosis Date  . Atrial fibrillation ([place])   . BPH (benign prostatic hyperplasia)   . Cancer ([place])    liver  . Cirrhosis ([place])   . Diabetes mellitus without complication ([place])   . GERD (gastroesophageal reflux disease)   . Hepatitis C     Assessment: 70 y/o M with medical history including cirrhosis / liver cancer s/p liver transplant in 2018, pulmonary arterial hypertension, diabetes, Afib, BPH, substance abuse who is admitted with acute hypoxic respiratory failure secondary to acute encephalopathy / possible CVA and hyperglycemic emergency / AKI. Pt not on anticoagulation prior to admission. reports of coffee ground emesis on 4/29 and has since resolved. Pharmacy has been consulted for heparin dosing and monitoring for afib and bridge to warfarin.  Hgb decreased from admission but maintaining ~11 g/dL. Thrombocytopenia improving.   Dosing by wt of 77.6 kg  DDI: amiodarone, cyclosporine LFTs: Albumin: 4.7, Tbili 1.7, BL INR 1  Heparin Date Time HL Rate/Comment 5/2 1702 0.29 Slightly < goal; 1200>>1350  un/hr 5/2 0200 0.35 Therapeutic x 1; 1350 un/hr 5/3 0826 0.32 Therapeutic x 2; 1350 un/hr 5/3 1430 0.24 Subtherapeutic; 1350>> 5/4 0203 0.36 Therapeutic x 1 5/4 1048 0.19 Subtherapeutic, spoke with nurse who stated infusion had been off for ~1.5 hours 5/5 0139 0.30 Therapeutic x 1 5/5 0930 0.41 Therapeutic x 2 5/6 0825 0.32 Therapeutic x 3  Warfarin  Date INR Dose 5/1 1.1 Baseline INR 5/4 N/A Warfarin 7.5 mg 5/5 1.1 Warfarin 7.5 mg 5/6 2.1 Warfarin 2.5 mg   Goal of Therapy:  Heparin level 0.3-0.7 units/ml Monitor platelets by anticoagulation protocol: Yes   Plan:   HL therapeutic, will continue heparin infusion at 1500 units/hr. Will recheck HL with AM labs  INR 2.1 - will give 2.5 mg warfarin tonight. Large jump from yesterday after only 2 doses of warfarin. Likely secondary to DDI (amiodarone). Likely also from low vitamin K dietary intake  Re-check INR with AM labs  Continue to monitor H&H and platelets   Benita Gutter 07/17/2020 8:59 AM

## 2020-07-17 NOTE — Progress Notes (Signed)
NAME:  Hector Mueller, MRN:  [id number], DOB:  1950/12/14, LOS: 6 ADMISSION DATE:  07/10/2020, CONSULTATION DATE: 07/10/2020 REFERRING MD: Dr. Joni Fears, CHIEF COMPLAINT: Altered mental status  History of Present Illness:  70 year old male who was brought to the [facility] ED due to altered mental status.  Per ED documentation the patient was reportedly in his usual state of health last night on 07/09/20 until about 11:30 PM.  This morning the patient was noted to have difficulty walking, but was talking and interacting normally with family.  However today at noon he was found to be unable to stand or move purposefully as well as being unable to speak.         Events: 07/12/20- I met with family (wife and niece) updated on plan and reviewed findings. Patient with improved HHS, weaning on PRVC, coffe ground NG material resolved.   07/17/20- Patient is improved , he has central jugular line on right neck that will be removed. He is ready for more aggressive PT/OT.  He is lucid no pain reported.  He still has electrolyte derrangements and there is pharmacy consult to help repletion. His sugars are better but still being monitored closely.      Pertinent  Medical History  Cirrhosis status post liver transplant in 2018 Pulmonary arterial hypertension Hepatitis C Type 2 diabetes mellitus Atrial fibrillation BPH Liver cancer Substance abuse Tracheostomy Hyperlipidemia   Interim History / Subjective:  Patient intubated and sedated, unable to follow commands at this time.  Objective   Blood pressure (!) 157/77, pulse (!) 59, temperature (!) 97.5 F (36.4 C), resp. rate 18, height 6' (1.829 m), weight 89.4 kg, SpO2 99 %.        Intake/Output Summary (Last 24 hours) at 07/17/2020 0744 Last data filed at 07/17/2020 0600 Gross per 24 hour  Intake 897.46 ml  Output 1525 ml  Net -627.54 ml   Filed Weights   07/13/20 0500 07/14/20 0500 07/17/20 0519  Weight: 81.9 kg 78.5 kg 89.4 kg     Examination: General: Adult male, critically ill, lying in bed intubated & sedated requiring mechanical ventilation, NAD HEENT: MM pink/moist, anicteric, atraumatic, neck supple Neuro: Sedated, unable to follow commands commands, PERRL +3 CV: s1s2 RRR, NSR on monitor, no r/m/g Pulm: Regular, non labored on PRVC at 40% FiO2 and PEEP of 5, breath sounds diminished throughout GI: soft, rounded, non tender, bs x 4 GU: foley in place with clear yellow urine Skin: Limited exam- no rashes/lesions noted Extremities: warm/dry, pulses + 2 R/P, trace edema noted BLE    Imaging    IMPRESSION: 1. Negative CTA for emergent large vessel occlusion. 2. Negative CT perfusion with no evidence for acute ischemia or other perfusion abnormality. 3. Severe distal right P3 stenoses. 4. Otherwise wide patency of the major arterial vasculature of the head and neck. No other hemodynamically significant or correctable stenosis. 5. Question diffuse circumferential esophageal wall thickening, which could reflect changes of acute esophagitis and/or reflux disease.   Assessment & Plan:  Acute Hypoxic Respiratory Failure secondary to acute encephalopathy in the setting of HHS and possible CVA- RESOLVED  - Ventilator settings: PRVC  8 mL/kg, 40% FiO2, 5 PEEP, continue ventilator support & lung protective strategies - Wean PEEP & FiO2 as tolerated, maintain SpO2 > 90% - Head of bed elevated 30 degrees, VAP protocol in place - Plateau pressures less than 30 cm H20  - Intermittent chest x-ray & ABG PRN - Daily WUA with SBT as tolerated  -  Ensure adequate pulmonary hygiene  - F/u cultures, trend PCT - Continue Aspiration Pna coverage unasyn - bronchodilators PRN - PAD protocol in place: continue Fentanyl & Propofol              -patient was aggitated with failure of SBT difficult to sedate    HHS PMHx: T2DM -improved - follow DKA protocol   Acute encephalopathy due to suspected CVA Initial imaging  negative for any intracranial abnormality.  Patient is outside the window for tPA, negative for LVO. -Supportive care -We will need follow-up MRI -Consider neurological consult as needed -Frequent neuro checks, wean sedatives as tolerated  Acute Kidney Injury on CKD Baseline Cr: 1.6, Cr on admission: 3.35 - Strict I/O's: alert provider if UOP < 0.5 mL/kg/hr - gentle IVF hydration  - Daily BMP, replace electrolytes PRN - Avoid nephrotoxic agents as able, ensure adequate renal perfusion - Consult nephrology if iHD or CRRT indicated  - consider renal US  Coffee-ground emesis Thrombocytopenia  Cirrhosis status post liver transplant in 2018 Minimal coffee-ground emesis from OG tube, patient hemodynamically stable even on sedation and CBC grossly normal.  Will monitor for now Patient takes cyclosporine outpatient. -Continue cyclosporine home regimen -Trend hepatic function -Daily CBC -Monitor for signs and symptoms of bleeding -Transfuse if hemoglobin less than 7 - Consider transfusion of platelets if < 10  Pulmonary arterial hypertension Who class I -Continue home sildenafil -Continuous cardiac monitoring -Consult cardiology if unable to tolerate home regimen  Best practice (right click and "Reselect all SmartList Selections" daily)  Diet:  NPO Pain/Anxiety/Delirium protocol (if indicated): Yes (RASS goal -1) VAP protocol (if indicated): Yes DVT prophylaxis: Subcutaneous Heparin GI prophylaxis: PPI Glucose control:  Insulin gtt Central venous access:  N/A Arterial line:  N/A Foley:  Yes, and it is still needed Mobility:  bed rest  PT consulted: N/A Last date of multidisciplinary goals of care discussion 07/10/20 Code Status:  full code Disposition: ICU  Labs   CBC: Recent Labs  Lab 07/10/20 2109 07/11/20 0316 07/12/20 0140 07/12/20 0433 07/13/20 0318 07/13/20 1004 07/14/20 0200 07/14/20 0826 07/14/20 1630 07/14/20 2334 07/15/20 0500 07/16/20 0459  WBC 13.6*    < > 11.9*  --  6.6  --  7.2  --   --   --  4.8 4.5  NEUTROABS 12.3*  --   --   --  5.0  --   --   --   --   --   --   --   HGB 16.0   < > 12.3*   < > 11.8*   < > 11.3* 10.1* 10.2* 9.9* 10.4* 10.6*  HCT 46.1   < > 35.0*   < > 34.7*   < > 33.2* 29.9* 29.5* 28.6* 29.5* 31.1*  MCV 87.8   < > 87.1  --  89.4  --  89.5  --   --   --  87.5 89.6  PLT 128*   < > 113*  --  119*  --  99*  --   --   --  94* 121*   < > = values in this interval not displayed.    Basic Metabolic Panel: Recent Labs  Lab 07/12/20 0140 07/12/20 0932 07/13/20 0317 07/13/20 1702 07/14/20 0200 07/14/20 1630 07/15/20 0500 07/16/20 0459  NA 137  --  133*  --  132*  --  138 139  K 3.6  --  4.1  --  4.0 3.6 3.2* 3.1*  CL 99  --  99  --  100  --  106 109  CO2 26  --  22  --  22  --  24 22  GLUCOSE 170*  --  292*  --  451*  --  123* 66*  BUN 46*  --  42*  --  46*  --  46* 43*  CREATININE 2.85*  --  2.66*  --  2.85*  --  2.60* 2.42*  CALCIUM 9.0  --  8.0*  --  7.7*  --  8.4* 7.7*  MG 1.8   < > 1.9 2.3 2.3  --  2.1 1.9  PHOS <1.0*   < > 3.6 3.2 2.6  --  2.3* 3.1   < > = values in this interval not displayed.   GFR: Estimated Creatinine Clearance: 30.3 mL/min (A) (by C-G formula based on SCr of 2.42 mg/dL (H)). Recent Labs  Lab 07/10/20 2155 07/10/20 2355 07/11/20 0316 07/12/20 0140 07/13/20 0318 07/14/20 0200 07/15/20 0500 07/16/20 0459  PROCALCITON  --   --  0.24 0.57 2.95  --   --   --   WBC  --   --  9.3 11.9* 6.6 7.2 4.8 4.5  LATICACIDVEN 3.2* 3.8*  --   --   --   --   --   --     Liver Function Tests: Recent Labs  Lab 07/10/20 2109  AST 14*  ALT 11  ALKPHOS 88  BILITOT 1.7*  PROT 7.9  ALBUMIN 4.7   No results for input(s): LIPASE, AMYLASE in the last 168 hours. Recent Labs  Lab 07/11/20 0316 07/13/20 0842  AMMONIA 23 <9*    ABG    Component Value Date/Time   PHART 7.36 07/11/2020 0013   PCO2ART 36 07/11/2020 0013   PO2ART 58 (L) 07/11/2020 0013   HCO3 20.3 07/11/2020 0013    ACIDBASEDEF 4.5 (H) 07/11/2020 0013   O2SAT 88.5 07/11/2020 0013     Coagulation Profile: Recent Labs  Lab 07/10/20 2109 07/12/20 0932 07/16/20 0459  INR 1.0 1.1 1.1    Cardiac Enzymes: No results for input(s): CKTOTAL, CKMB, CKMBINDEX, TROPONINI in the last 168 hours.  HbA1C: Hgb A1c MFr Bld  Date/Time Value Ref Range Status  07/11/2020 03:16 AM 10.8 (H) 4.8 - 5.6 % Final    Comment:    (NOTE) Pre diabetes:          5.7%-6.4%  Diabetes:              >6.4%  Glycemic control for   <7.0% adults with diabetes     CBG: Recent Labs  Lab 07/16/20 1114 07/16/20 1614 07/16/20 1947 07/16/20 2341 07/17/20 0352  GLUCAP 96 70 87 77 77    Review of Systems:   UTA patient intubated and sedated  Past Medical History:  He,  has a past medical history of Atrial fibrillation ([place]), BPH (benign prostatic hyperplasia), Cancer ([place]), Cirrhosis ([place]), Diabetes mellitus without complication ([place]), GERD (gastroesophageal reflux disease), and Hepatitis C.   Surgical History:   Past Surgical History:  Procedure Laterality Date  . ANKLE FRACTURE SURGERY Bilateral   . BACK SURGERY    . CLAVICLE SURGERY    . INTRAMEDULLARY (IM) NAIL INTERTROCHANTERIC Right 11/28/2017   Procedure: INTRAMEDULLARY (IM) NAIL INTERTROCHANTRIC;  Surgeon: Earnestine Leys, MD;  Location: [facility] ORS;  Service: Orthopedics;  Laterality: Right;  . LIVER SURGERY       Social History:   reports that he has never smoked. He has never used smokeless  tobacco. He reports that he does not drink alcohol and does not use drugs.   Family History:  His family history is not on file.   Allergies No Known Allergies   Home Medications  Prior to Admission medications   Medication Sig Start Date End Date Taking? Authorizing Provider  alendronate (FOSAMAX) 70 MG tablet  10/27/17   [provider]  aspirin 81 MG chewable tablet Chew 1 tablet (81 mg total) by mouth daily. 12/02/17   Vaughan Basta, MD   bisacodyl (DULCOLAX) 5 MG EC tablet Take 1 tablet (5 mg total) by mouth daily as needed for moderate constipation. 12/01/17   Vaughan Basta, MD  calcium carbonate (TUMS - DOSED IN MG ELEMENTAL CALCIUM) 500 MG chewable tablet Chew 2 tablets (400 mg of elemental calcium total) by mouth daily as needed for indigestion or heartburn. 12/01/17   Vaughan Basta, MD  Cholecalciferol (VITAMIN D3) 2000 units capsule Take 2,000 Units by mouth daily.    [provider]  cycloSPORINE modified (NEORAL) 100 MG capsule Take 100 mg by mouth 2 (two) times daily. Take along with 3 x 25 mg cap twice a day    [provider]  cycloSPORINE modified (NEORAL) 25 MG capsule Take 75 mg by mouth 2 (two) times daily. Take with 100 mg cap twice a day    [provider]  docusate sodium (COLACE) 100 MG capsule Take 1 capsule (100 mg total) by mouth 2 (two) times daily. 12/01/17   Vaughan Basta, MD  enoxaparin (LOVENOX) 40 MG/0.4ML injection Inject 0.4 mLs (40 mg total) into the skin daily for 12 days. 12/02/17 12/14/17  Vaughan Basta, MD  ferrous sulfate 325 (65 FE) MG tablet Take 1 tablet (325 mg total) by mouth 2 (two) times daily with a meal. 12/01/17   Vaughan Basta, MD  HYDROcodone-acetaminophen (NORCO/VICODIN) 5-325 MG tablet Take 1 tablet by mouth every 4 (four) hours as needed for moderate pain or severe pain (pain score 4-6). 12/01/17   Vaughan Basta, MD  insulin aspart (NOVOLOG) 100 UNIT/ML FlexPen Inject 6 Units into the skin 3 (three) times daily with meals.  12/09/16 02/06/18  [provider]  Insulin Glargine (LANTUS SOLOSTAR) 100 UNIT/ML Solostar Pen INJECT 12 UNITS SUBCUTANEOUSLY (UNDER SKIN) NIGHTLY 12/09/16 12/09/17  [provider]  Melatonin 3 MG TABS Take by mouth. 02/06/17   [provider]  mirtazapine (REMERON) 15 MG tablet Take 15 mg by mouth at bedtime.  10/18/17   [provider]  Multiple Vitamin  (MULTIVITAMIN) tablet Take 1 tablet by mouth daily.    [provider]  ondansetron (ZOFRAN) 4 MG tablet Take 1 tablet (4 mg total) by mouth every 6 (six) hours as needed for nausea. 12/01/17   Vaughan Basta, MD  senna (SENOKOT) 8.6 MG TABS tablet Take 1 tablet (8.6 mg total) by mouth 2 (two) times daily. 12/01/17   Vaughan Basta, MD  sildenafil (REVATIO) 20 MG tablet Take 40 mg by mouth 3 (three) times daily.  10/17/17   [provider]  Specialty Vitamins Products (MAGNESIUM, AMINO ACID CHELATE,) 133 MG tablet Take by mouth. 03/02/17   [provider]  spironolactone (ALDACTONE) 50 MG tablet Take 50 mg by mouth daily. 01/07/14   [provider]            Ottie Glazier, M.D.  [facility]

## 2020-07-17 NOTE — Consult Note (Signed)
   Heart Failure Nurse Navigator Note  HFrEF 40-45% by echocardiogram in September 2021 ejection fraction was 55 to 60%.  Presented to the emergency room with altered mental status.  Comorbidities:  Atrial fibrillation Diabetes GERD   Is a history of liver transplant in 2018   Medications:  Amiodarone 400 mg twice a day Metoprolol succinate 25 mg 2 times a day  He is not currently on an ACE/ARB/Arni SGLTs/or MRA due to chronic kidney disease.  Labs:  Sodium 138, potassium 4.5, chloride 102, CO2 23, BUN 42, creatinine 2.63. Intake 897 mL Output 1800 mL Weight 89.4, there were no recorded weights prior to that.  Assessment:  General-he is awake and alert sitting up in the chair having just ambulated in his room with physical therapy.  HEENT- pupils are equal, no JVD, has right EJ.  Cardiac-heart tones are regular rate and rhythm no murmurs gallops or rubs appreciated.  Chest-clear to posterior auscultation.  Abdomen-soft nontender.  Musculoskeletal there is no edema.  Psych-is pleasant and appropriate makes good eye contact.  Neurologic moves all extremities without difficulty, speech is clear.   Initial meeting with patient today.  Discussed heart failure, he states that the doctors had talked to him about the condition of his heart.  Discussed the importance of low-sodium diet, daily weights recording and what to report to physician.  He was given the living with heart failure teaching booklet along with handouts for low sodium and how to take control of your heart failure.  Also discussed the outpatient heart failure clinic and he was given information with an appointment time.  He is hoping to be going home.   Pricilla Riffle RN [facility]

## 2020-07-17 NOTE — Progress Notes (Signed)
PROGRESS NOTE    Hector Mueller  JJK:[id number] DOB: [birth year] DOA: 07/10/2020 PCP: [name], MD   Brief Narrative:  70 year old male who was brought to the [facility] ED due to altered mental status.  Per ED documentation the patient was reportedly in his usual state of health last night on 07/09/20 until about 11:30 PM.  This morning the patient was noted to have difficulty walking, but was talking and interacting normally with family.  However today at noon he was found to be unable to stand or move purposefully as well as being unable to speak. Extubated successfully on 5/3.  Mental status returning to baseline on 5/4.  Patient status post DC cardioversion on 5/2 with successful restoration of sinus rhythm.  Seen by cardiology and converted to p.o. amiodarone.  Seen by SLP and diet advanced as well. 5/5: Per nursing patient had some hallucinations and altered mentation overnight.  Endorsed poor sleep.  Was appropriate with me this morning.  Does endorse some lethargy. 5/6: No acute status changes.  Interestingly INR went from 1.1-2.1 in 24 hours.  Suspect impact from amiodarone.   Assessment & Plan:   Active Problems:   Acute respiratory failure (HCC)   Atrial fibrillation (HCC)   HFrEF (heart failure with reduced ejection fraction) (HCC)  Acute Hypoxic Respiratory Failure secondary to acute encephalopathy in the setting of HHS Acute stroke was ruled out Acute encephalopathy, multifactorial. metabolic/ suspected CVA Poorly controlled diabetes type 2 presented with HHS and now again with hyperglycemia CT imaging survey negative for acute stroke/LVO.  MRI not possible due to pacer.  Mental status back at baseline.  Tolerated extubation well.  Suspected etiology of encephalopathy and airway compromise due to HHS. Plan: Improve glycemic control Oxygen as necessary Possible discharge in 24 hours  Paroxysmal A. fib status post cardioversion Status post DC cardioversion on 5/2.   Tolerated well.  Seen by cardiology this morning.  Transition to p.o. amiodarone. 5/6 INR 2.1 Plan: Continue heparin with warfarin bridge for 1 additional day.  If INR remains therapeutic we will discontinue heparin gtt. and plan on discharge home on 5/7  Acute systolic congestive heart failure Echocardiogram was done which showed EF 40 to 45% He will need to be started on beta-blocker/ACE once blood pressure improves Defer to cardiology regarding ischemic work-up.  Could be deferred outpatient  Acute Kidney Injury on CKD Baseline Cr: 1.6, Cr on admission: 3.35, likely prerenal Suspect prerenal azotemia in the setting of respiratory compromise.  Creatinine improving.  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 No more coffee-ground emesis H&H remained stable Continue cyclosporine and mycophenolate at home Follow cyclosporine level Monitor for signs and symptoms of bleeding Transfuse if hemoglobin less than 7  Pulmonary arterial hypertension Who class I Hold sildenafil due to hypotension Continuous cardiac monitoring   DVT prophylaxis: Heparin GTT Code Status: Full Family Communication: Spouse at bedside 5/4 Disposition Plan: Status is: Inpatient  Remains inpatient appropriate because:Inpatient level of care appropriate due to severity of illness   Dispo: The patient is from: Home              Anticipated d/c is to: SNF versus home with home health              Patient currently is not medically stable to d/c.   Difficult to place patient No  Resolved respiratory failure.  Cardiology following.  No management changes from their standpoint.  We will plan on continuing warfarin with heparin bridge for additional 24 hours.  If INR therapeutic will discontinue heparin and plan on discharge home with home health 5/7     Level of care: Progressive Cardiac  Consultants:    Cardiology  Procedures:   DC cardioversion  Endotracheal intubation  Antimicrobials:   None   Subjective: Patient seen and examined.  Resting in bed.  Visibly no distress.  No pain complaints.  Objective: Vitals:   07/17/20 0519 07/17/20 0742 07/17/20 0834 07/17/20 1141  BP:  (!) 157/77  (!) 177/75  Pulse:  (!) 59 70 69  Resp:  18  18  Temp:  (!) 97.5 F (36.4 C)  98.1 F (36.7 C)  TempSrc:      SpO2:  99%  98%  Weight: 89.4 kg     Height:        Intake/Output Summary (Last 24 hours) at 07/17/2020 1352 Last data filed at 07/17/2020 1100 Gross per 24 hour  Intake 1076.7 ml  Output 2400 ml  Net -1323.3 ml   Filed Weights   07/13/20 0500 07/14/20 0500 07/17/20 0519  Weight: 81.9 kg 78.5 kg 89.4 kg    Examination:  General exam: No acute distress.  Appears fatigued.  Hoarse voice Respiratory system: Mild scattered crackles bilaterally.  Normal work of breathing.  Room air Cardiovascular system: S1-S2.  Regular rate and rhythm.  No murmurs.  No pedal edema Gastrointestinal system: Abdomen is nondistended, soft and nontender. No organomegaly or masses felt. Normal bowel sounds heard. Central nervous system: Alert and oriented. No focal neurological deficits. Extremities: Symmetric 5 x 5 power. Skin: No rashes, lesions or ulcers Psychiatry: Judgement and insight appear impaired. Mood & affect flattened.     Data Reviewed: I have personally reviewed following labs and imaging studies  CBC: Recent Labs  Lab 07/10/20 2109 07/11/20 0316 07/13/20 0318 07/13/20 1004 07/14/20 0200 07/14/20 0826 07/14/20 2334 07/15/20 0500 07/16/20 0459 07/17/20 0825 07/17/20 0832  WBC 13.6*   < > 6.6  --  7.2  --   --  4.8 4.5 6.9 6.9  NEUTROABS 12.3*  --  5.0  --   --   --   --   --   --   --   --   HGB 16.0   < > 11.8*   < > 11.3*   < > 9.9* 10.4* 10.6* 11.0* 11.1*  HCT 46.1   < > 34.7*   < > 33.2*   < > 28.6* 29.5* 31.1* 32.8* 32.9*  MCV 87.8   < > 89.4  --  89.5  --    --  87.5 89.6 91.4 89.9  PLT 128*   < > 119*  --  99*  --   --  94* 121* 138* 143*   < > = values in this interval not displayed.   Basic Metabolic Panel: Recent Labs  Lab 07/13/20 1702 07/14/20 0200 07/14/20 1630 07/15/20 0500 07/16/20 0459 07/17/20 0825 07/17/20 0832  NA  --  132*  --  138 139 139 138  K  --  4.0 3.6 3.2* 3.1* 4.6 4.5  CL  --  100  --  106 109 108 107  CO2  --  22  --  24 22 23 23   GLUCOSE  --  451*  --  123* 66* 77 77  BUN  --  46*  --  46* 43* 42* 42*  CREATININE  --  2.85*  --  2.60* 2.42* 2.69* 2.63*  CALCIUM  --  7.7*  --  8.4* 7.7* 8.8* 8.6*  MG 2.3 2.3  --  2.1 1.9 2.5*  --   PHOS 3.2 2.6  --  2.3* 3.1 3.6  --    GFR: Estimated Creatinine Clearance: 27.9 mL/min (A) (by C-G formula based on SCr of 2.63 mg/dL (H)). Liver Function Tests: Recent Labs  Lab 07/10/20 2109  AST 14*  ALT 11  ALKPHOS 88  BILITOT 1.7*  PROT 7.9  ALBUMIN 4.7   No results for input(s): LIPASE, AMYLASE in the last 168 hours. Recent Labs  Lab 07/11/20 0316 07/13/20 0842  AMMONIA 23 <9*   Coagulation Profile: Recent Labs  Lab 07/10/20 2109 07/12/20 0932 07/16/20 0459 07/17/20 0825  INR 1.0 1.1 1.1 2.1*   Cardiac Enzymes: No results for input(s): CKTOTAL, CKMB, CKMBINDEX, TROPONINI in the last 168 hours. BNP (last 3 results) No results for input(s): PROBNP in the last 8760 hours. HbA1C: No results for input(s): HGBA1C in the last 72 hours. CBG: Recent Labs  Lab 07/17/20 0744 07/17/20 0811 07/17/20 0838 07/17/20 0857 07/17/20 1133  GLUCAP 63* 64* 65* 75 147*   Lipid Profile: No results for input(s): CHOL, HDL, LDLCALC, TRIG, CHOLHDL, LDLDIRECT in the last 72 hours. Thyroid Function Tests: No results for input(s): TSH, T4TOTAL, FREET4, T3FREE, THYROIDAB in the last 72 hours. Anemia Panel: No results for input(s): VITAMINB12, FOLATE, FERRITIN, TIBC, IRON, RETICCTPCT in the last 72 hours. Sepsis Labs: Recent Labs  Lab 07/10/20 2155 07/10/20 2355  07/11/20 0316 07/12/20 0140 07/13/20 0318  PROCALCITON  --   --  0.24 0.57 2.95  LATICACIDVEN 3.2* 3.8*  --   --   --     Recent Results (from the past 240 hour(s))  Resp Panel by RT-PCR (Flu A&B, Covid) Nasopharyngeal Swab     Status: None   Collection Time: 07/10/20 10:05 PM   Specimen: Nasopharyngeal Swab; Nasopharyngeal(NP) swabs in vial transport medium  Result Value Ref Range Status   SARS Coronavirus 2 by RT PCR NEGATIVE NEGATIVE Final    Comment: (NOTE) SARS-CoV-2 target nucleic acids are NOT DETECTED.  The SARS-CoV-2 RNA is generally detectable in upper respiratory specimens during the acute phase of infection. The lowest concentration of SARS-CoV-2 viral copies this assay can detect is 138 copies/mL. A negative result does not preclude SARS-Cov-2 infection and should not be used as the sole basis for treatment or other patient management decisions. A negative result may occur with  improper specimen collection/handling, submission of specimen other than nasopharyngeal swab, presence of viral mutation(s) within the areas targeted by this assay, and inadequate number of viral copies(<138 copies/mL). A negative result must be combined with clinical observations, patient history, and epidemiological information. The expected result is Negative.  Fact Sheet for Patients:  EntrepreneurPulse.com.au  Fact Sheet for Healthcare Providers:  IncredibleEmployment.be  This test is no t yet approved or cleared by the [place] FDA and  has been authorized for detection and/or diagnosis of SARS-CoV-2 by FDA under an Emergency Use Authorization (EUA). This EUA will remain  in effect (meaning this test can be used) for the duration of the COVID-19 declaration under Section 564(b)(1) of the Act, 21 U.S.C.section 360bbb-3(b)(1), unless the authorization is terminated  or revoked sooner.       Influenza A by PCR NEGATIVE NEGATIVE Final    Influenza B by PCR NEGATIVE NEGATIVE Final    Comment: (NOTE) The Xpert Xpress SARS-CoV-2/FLU/RSV plus assay is intended as  an aid in the diagnosis of influenza from Nasopharyngeal swab specimens and should not be used as a sole basis for treatment. Nasal washings and aspirates are unacceptable for Xpert Xpress SARS-CoV-2/FLU/RSV testing.  Fact Sheet for Patients: EntrepreneurPulse.com.au  Fact Sheet for Healthcare Providers: IncredibleEmployment.be  This test is not yet approved or cleared by the [place] FDA and has been authorized for detection and/or diagnosis of SARS-CoV-2 by FDA under an Emergency Use Authorization (EUA). This EUA will remain in effect (meaning this test can be used) for the duration of the COVID-19 declaration under Section 564(b)(1) of the Act, 21 U.S.C. section 360bbb-3(b)(1), unless the authorization is terminated or revoked.  Performed at [facility], [address] [place], [place] [address]   Blood culture (single)     Status: None   Collection Time: 07/10/20 11:53 PM   Specimen: BLOOD  Result Value Ref Range Status   Specimen Description BLOOD RIGHT [facility]   Final   Special Requests   Final    BOTTLES DRAWN AEROBIC AND ANAEROBIC Blood Culture adequate volume   Culture   Final    NO GROWTH 5 DAYS Performed at [facility], [address] [place], [place] [address]    Report Status 07/16/2020 FINAL  Final  MRSA PCR Screening     Status: None   Collection Time: 07/11/20  2:00 AM   Specimen: Nasopharyngeal  Result Value Ref Range Status   MRSA by PCR NEGATIVE NEGATIVE Final    Comment:        The GeneXpert MRSA Assay (FDA approved for NASAL specimens only), is one component of a comprehensive MRSA colonization surveillance program. It is not intended to diagnose MRSA infection nor to guide or monitor treatment for MRSA infections. Performed at [facility], [address] [place], [place] [address]   Culture, Respiratory w Gram Stain     Status: None   Collection Time: 07/12/20 10:41 AM   Specimen: Tracheal Aspirate; Respiratory  Result Value Ref Range Status   Specimen Description   Final    TRACHEAL ASPIRATE Performed at [facility], [address], [place], [place] [address]    Special Requests   Final    NONE Performed at [facility], [address] [place], [place] [address]    Gram Stain   Final    RARE WBC PRESENT, PREDOMINANTLY PMN NO ORGANISMS SEEN    Culture   Final    Normal respiratory flora-no Staph aureus or Pseudomonas seen Performed at [facility] [address] [place],  [address]    Report Status 07/14/2020 FINAL  Final         Radiology Studies: No results found.      Scheduled Meds: . amiodarone  400 mg Oral BID  . cycloSPORINE modified  200 mg Oral Daily   And  . cycloSPORINE modified  175 mg Oral QHS  . docusate  100 mg Per Tube BID  . DULoxetine  30 mg Oral Daily  . feeding supplement  237 mL Oral BID BM  . insulin aspart  0-9 Units Subcutaneous Q4H  . insulin glargine  20 Units Subcutaneous BID  . mouth rinse  15 mL Mouth Rinse BID  . metoprolol succinate  25 mg Oral BID  . mirtazapine  15 mg Oral QHS  . multivitamin with minerals  1 tablet Oral Daily  . mycophenolate  500 mg Oral BID  . pantoprazole (PROTONIX) IV  40 mg  Intravenous Q12H  . polyethylene glycol  17 g Per Tube Daily  . QUEtiapine  25 mg Oral QHS  . rosuvastatin  5 mg Oral QPM  . warfarin  2.5 mg Oral ONCE-1600  . Warfarin - Pharmacist Dosing Inpatient   Does not apply q1600   Continuous Infusions: . sodium chloride 10 mL/hr at 07/16/20 0500  . heparin 1,500 Units/hr (07/17/20 0707)     LOS: 6 days    Time spent: 15 minutes    Sidney Ace, MD [facility] Pager [phone]  If 7PM-7AM, please contact night-coverage 07/17/2020, 1:52 PM

## 2020-07-17 NOTE — Progress Notes (Signed)
Physical Therapy Treatment Patient Details Name: Hector Mueller MRN: [id number] DOB: [birth year] Today's Date: 07/17/2020    History of Present Illness 70 y/o male with h/o liver cirrhosis due to hepatitis C status post liver transplant in 2018, DM, BPH, GERD, AFib and CKD who is admitted with AKI and possible CVA. MRI negative for CVA    PT Comments    Patient received in bed. Wants to go home. Reports he is feeling better. Patient requires min assist for supine to sit. Transfers sit to stand from low bed with min guard and cues. Patient was able to ambulate with RW 25 feet with min guard. He will continue to benefit from skilled PT while here to improve strength and activity tolerance for safe return home.       Follow Up Recommendations  Home health PT;Supervision - Intermittent;Supervision for mobility/OOB     Equipment Recommendations  Rolling walker with 5" wheels    Recommendations for Other Services       Precautions / Restrictions Precautions Precautions: Fall Restrictions Weight Bearing Restrictions: No    Mobility  Bed Mobility Overal bed mobility: Needs Assistance Bed Mobility: Supine to Sit     Supine to sit: Min assist     General bed mobility comments: Requires assist to raise trunk, use of bed rail    Transfers Overall transfer level: Needs assistance Equipment used: Rolling walker (2 wheeled) Transfers: Sit to/from Stand Sit to Stand: Min guard         General transfer comment: min guard from low bed. Cues for hand placement  Ambulation/Gait Ambulation/Gait assistance: Min guard Gait Distance (Feet): 25 Feet Assistive device: Rolling walker (2 wheeled) Gait Pattern/deviations: Step-to pattern;Decreased step length - right;Decreased step length - left Gait velocity: decr   General Gait Details: Patient with improved mobility, min guard, slow, steady pace, no lob.   Stairs             Wheelchair Mobility    Modified Rankin (Stroke  Patients Only)       Balance Overall balance assessment: Needs assistance Sitting-balance support: Feet supported Sitting balance-Leahy Scale: Good     Standing balance support: Bilateral upper extremity supported;During functional activity Standing balance-Leahy Scale: Fair Standing balance comment: weak, reliant on B UE support                            Cognition Arousal/Alertness: Awake/alert Behavior During Therapy: WFL for tasks assessed/performed Overall Cognitive Status: Within Functional Limits for tasks assessed                                        Exercises      General Comments        Pertinent Vitals/Pain Pain Assessment: Faces Faces Pain Scale: Hurts a little bit Pain Location: lower back Pain Descriptors / Indicators: Aching;Discomfort;Grimacing Pain Intervention(s): Monitored during session;Repositioned    Home Living                      Prior Function            PT Goals (current goals can now be found in the care plan section) Acute Rehab PT Goals Patient Stated Goal: to get better, go home PT Goal Formulation: With patient Time For Goal Achievement: 07/28/20 Potential to Achieve Goals: Good Progress towards PT goals: Progressing  toward goals    Frequency    Min 2X/week      PT Plan Discharge plan needs to be updated    Co-evaluation              AM-PAC PT "6 Clicks" Mobility   Outcome Measure  Help needed turning from your back to your side while in a flat bed without using bedrails?: None Help needed moving from lying on your back to sitting on the side of a flat bed without using bedrails?: A Little Help needed moving to and from a bed to a chair (including a wheelchair)?: A Little Help needed standing up from a chair using your arms (e.g., wheelchair or bedside chair)?: A Little Help needed to walk in hospital room?: A Little Help needed climbing 3-5 steps with a railing? : A  Lot 6 Click Score: 18    End of Session Equipment Utilized During Treatment: Gait belt Activity Tolerance: Patient tolerated treatment well Patient left: in chair;with call bell/phone within reach;with chair alarm set Nurse Communication: Mobility status PT Visit Diagnosis: Other abnormalities of gait and mobility (R26.89);Muscle weakness (generalized) (M62.81);Unsteadiness on feet (R26.81);Difficulty in walking, not elsewhere classified (R26.2);Pain     Time: 1020-1044 PT Time Calculation (min) (ACUTE ONLY): 24 min  Charges:  $Gait Training: 8-22 mins $Therapeutic Activity: 8-22 mins                     Azile Minardi, PT, GCS 07/17/20,10:53 AM

## 2020-07-17 NOTE — Progress Notes (Signed)
Progress Note  Patient Name: Hector Mueller Date of Encounter: 07/17/2020  Primary Cardiologist: [facility]  Subjective   Maintaining sinus rhythm. No chest pain, dyspnea, or palpitations. Feels weak.   Inpatient Medications    Scheduled Meds: . amiodarone  400 mg Oral BID  . cycloSPORINE modified  200 mg Oral Daily   And  . cycloSPORINE modified  175 mg Oral QHS  . docusate  100 mg Per Tube BID  . DULoxetine  30 mg Oral Daily  . feeding supplement  237 mL Oral BID BM  . insulin aspart  0-9 Units Subcutaneous Q4H  . insulin glargine  25 Units Subcutaneous BID  . mouth rinse  15 mL Mouth Rinse BID  . metoprolol succinate  25 mg Oral BID  . mirtazapine  15 mg Oral QHS  . multivitamin with minerals  1 tablet Oral Daily  . mycophenolate  500 mg Oral BID  . pantoprazole (PROTONIX) IV  40 mg Intravenous Q12H  . polyethylene glycol  17 g Per Tube Daily  . QUEtiapine  25 mg Oral QHS  . rosuvastatin  5 mg Oral QPM  . Warfarin - Pharmacist Dosing Inpatient   Does not apply q1600   Continuous Infusions: . sodium chloride 10 mL/hr at 07/16/20 0500  . heparin 1,500 Units/hr (07/17/20 0707)   PRN Meds: albuterol, docusate sodium, ondansetron (ZOFRAN) IV, polyethylene glycol, polyvinyl alcohol, traZODone   Vital Signs    Vitals:   07/16/20 1945 07/17/20 0336 07/17/20 0519 07/17/20 0742  BP: (!) 169/81 (!) 165/95  (!) 157/77  Pulse: 69 72  (!) 59  Resp: 16 20  18   Temp: 98.4 F (36.9 C) 98.5 F (36.9 C)  (!) 97.5 F (36.4 C)  TempSrc: Oral Oral    SpO2: 96% 100%  99%  Weight:   89.4 kg   Height:        Intake/Output Summary (Last 24 hours) at 07/17/2020 0757 Last data filed at 07/17/2020 0600 Gross per 24 hour  Intake 897.46 ml  Output 1800 ml  Net -902.54 ml   Filed Weights   07/13/20 0500 07/14/20 0500 07/17/20 0519  Weight: 81.9 kg 78.5 kg 89.4 kg    Telemetry    SR - Personally Reviewed  ECG    No new tracings - Personally Reviewed  Physical Exam   GEN: No  acute distress.   Neck: No JVD. Cardiac: RRR, no murmurs, rubs, or gallops.  Respiratory: Clear to auscultation bilaterally.  GI: Soft, nontender, non-distended.   MS: No edema; No deformity. Neuro:  Alert and oriented x 3; Nonfocal.  Psych: Normal affect.  Labs    Chemistry Recent Labs  Lab 07/10/20 2109 07/11/20 0316 07/14/20 0200 07/14/20 1630 07/15/20 0500 07/16/20 0459  NA 133*   < > 132*  --  138 139  K 4.9   < > 4.0 3.6 3.2* 3.1*  CL 81*   < > 100  --  106 109  CO2 25   < > 22  --  24 22  GLUCOSE 667*   < > 451*  --  123* 66*  BUN 53*   < > 46*  --  46* 43*  CREATININE 3.35*   < > 2.85*  --  2.60* 2.42*  CALCIUM 10.7*   < > 7.7*  --  8.4* 7.7*  PROT 7.9  --   --   --   --   --   ALBUMIN 4.7  --   --   --   --   --  AST 14*  --   --   --   --   --   ALT 11  --   --   --   --   --   ALKPHOS 88  --   --   --   --   --   BILITOT 1.7*  --   --   --   --   --   GFRNONAA 19*   < > 23*  --  25* 28*  ANIONGAP 27*   < > 10  --  8 8   < > = values in this interval not displayed.     Hematology Recent Labs  Lab 07/14/20 0200 07/14/20 0826 07/14/20 2334 07/15/20 0500 07/16/20 0459  WBC 7.2  --   --  4.8 4.5  RBC 3.71*  --   --  3.37* 3.47*  HGB 11.3*   < > 9.9* 10.4* 10.6*  HCT 33.2*   < > 28.6* 29.5* 31.1*  MCV 89.5  --   --  87.5 89.6  MCH 30.5  --   --  30.9 30.5  MCHC 34.0  --   --  35.3 34.1  RDW 13.2  --   --  13.1 13.8  PLT 99*  --   --  94* 121*   < > = values in this interval not displayed.    Cardiac EnzymesNo results for input(s): TROPONINI in the last 168 hours. No results for input(s): TROPIPOC in the last 168 hours.   BNPNo results for input(s): BNP, PROBNP in the last 168 hours.   DDimer No results for input(s): DDIMER in the last 168 hours.   Radiology    DG Chest Port 1 View  Result Date: 07/15/2020 IMPRESSION: Increasingly patchy and coalescent opacities in the mid to lower lungs. Could reflect developing airspace disease, worsening  atelectasis or edema. Electronically Signed   By: Lovena Le M.D.   On: 07/15/2020 03:37   [place] ASCITES (ABDOMEN LIMITED)  Result Date: 07/14/2020 IMPRESSION: 1. No evidence of ascites. Electronically Signed   By: Randa Ngo M.D.   On: 07/14/2020 20:38    Cardiac Studies   2D echo 07/13/2020: 1. Left ventricular ejection fraction, by estimation, is 40 to 45%. The  left ventricle has mild to moderately decreased function. The left  ventricle demonstrates global hypokinesis. Left ventricular diastolic  parameters are consistent with Grade I  diastolic dysfunction (impaired relaxation).  2. Right ventricular systolic function is normal. The right ventricular  size is normal.  3. The mitral valve is grossly normal. No evidence of mitral valve  regurgitation.  4. The aortic valve was not well visualized. Aortic valve regurgitation  is not visualized.  5. Pulmonic valve regurgitation not assessed.  Patient Profile     70 y.o. male with history of SSS s/p Micra pacemaker in 2018, persistent Afib previously on amiodarone and warfarin but stopped by EP team at [facility] in 2019 in setting of no recurrent a-fib and CHADSVASC score of 1, pulmonary hypertensionb, cirrhosis s/p liver transplant in 2018, DM, and HLD admitted with acute hypoxic respiratory failure secondary to AMS in the setting of HHS who we are seeing for Afib with RVR with associated shock requiring emergent cardioversion.   Assessment & Plan    1. Afib with RVR; -Status post emergent DCCV -Maintaining sinus rhythm  -Amiodarone 400 mg bid for a total of 7 days followed by 200 mg bid for 7 days, then 200 mg daily thereafter  for now -Heparin to Coumadin bridge  -Continue metoprolol succinate 25 mg bid with hold parameters  2. Cardiomyopathy/elevated HS-Tn: -EF 40-45% -Will need ischemic work-up at some point, could be considered outpatient -Toprol XL as above -Not currently on ACEi/ARB/ARNI/MRA/SGLT2i in the context of  CKD  3. CKD stage IV: -Slightly above his baseline on last check -Unable to exclude component of ATN given the stressors above  4. AMS: -Head CT without acute infarct  -Mentating better  5. Hypokalemia: -Repleted IV  6. Anemia: -Stable on last check -Monitor on heparin to Coumadin bridge     For questions or updates, please contact [place] Please consult [email or web address] for contact info under Cardiology/STEMI.    Signed, Christell Faith, PA-C [place] Pager: [phone] 07/17/2020, 7:57 AM

## 2020-07-17 NOTE — Care Management Important Message (Signed)
Important Message  Patient Details  Name: Hector Mueller MRN: [id number] Date of Birth: Feb 09, 1951   Medicare Important Message Given:  Yes     [name] 07/17/2020, 12:11 PM

## 2020-07-17 NOTE — Consult Note (Signed)
PHARMACY CONSULT NOTE  Pharmacy Consult for Electrolyte Monitoring and Replacement   Recent Labs: Potassium (mmol/L)  Date Value  07/17/2020 4.5   Magnesium (mg/dL)  Date Value  07/16/2020 1.9   Calcium (mg/dL)  Date Value  07/17/2020 8.6 (L)   Albumin (g/dL)  Date Value  07/10/2020 4.7   Phosphorus (mg/dL)  Date Value  07/16/2020 3.1   Sodium (mmol/L)  Date Value  07/17/2020 138   Assessment: Patient is a 70 y/o M with medical history including cirrhosis / liver cancer s/p liver transplant in 2018, pulmonary arterial hypertension, diabetes, Afib, BPH, substance abuse who is admitted with acute hypoxic respiratory failure secondary to acute encephalopathy / possible CVA and hyperglycemic emergency / AKI. Pharmacy has been consulted for electrolyte management.   Patient intubated 4/29 - 5/3. Now extubated on RA. On amiodarone. Renal dysfunction persistent. Has a central line. On dysphagia diet. NG tube out.  Goal of Therapy:  K 4-5.1 mmol/L Mg 2-2.4 mg/dL All other electrolytes within normal limits  Plan:  --No electrolyte replacement warranted today --Follow-up electrolytes with AM labs   Benita Gutter 07/17/2020 9:00 AM

## 2020-07-17 NOTE — Progress Notes (Signed)
Inpatient Diabetes Program Recommendations  AACE/ADA: New Consensus Statement on Inpatient Glycemic Control   Target Ranges:  Prepandial:   less than 140 mg/dL      Peak postprandial:   less than 180 mg/dL (1-2 hours)      Critically ill patients:  140 - 180 mg/dL   Results for Hector Mueller, Hector Mueller (MRN [id number]) as of 07/17/2020 08:40  Ref. Range 07/16/2020 07:23 07/16/2020 09:13 07/16/2020 11:14 07/16/2020 16:14 07/16/2020 19:47 07/16/2020 23:41 07/17/2020 03:52 07/17/2020 07:44 07/17/2020 08:11 07/17/2020 08:38  Glucose-Capillary Latest Ref Range: 70 - 99 mg/dL 65 (L) 88  Lantus 25 units 96 70 87 77 77 63 (L) 64 (L) 65 (L)  Results for Hector Mueller, Hector Mueller (MRN [id number]) as of 07/17/2020 08:40  Ref. Range 07/15/2020 07:32 07/15/2020 11:22 07/15/2020 15:33 07/15/2020 19:22 07/15/2020 22:21 07/15/2020 23:15  Glucose-Capillary Latest Ref Range: 70 - 99 mg/dL 105 (H) 117 (H)   Lantus 30 units 249 (H)  Novolog 8 units 211 (H)  Novolog 5 units      Lantus 30 units 106 (H)   Review of Glycemic Control   Diabetes history: DM2 Outpatient Diabetes medications: Lantus 12 units daily, Novolog 6 units TID with meals Current orders for Inpatient glycemic control: Lantus 25 units BID, Novolog 0-9 units Q4H  Inpatient Diabetes Program Recommendations:    Insulin: Patient received Lantus 30 units twice on 07/15/20 and Lantus 25 units once (no HS dose given) on 07/16/20 and fasting glucose 63 mg/dl this morning. Please consider decreasing Lantus to 20 units daily.  Thanks, Barnie Alderman, RN, MSN, CDE Diabetes Coordinator Inpatient Diabetes Program [phone] (Team Pager from 8am to 5pm)

## 2020-07-18 DIAGNOSIS — J9601 Acute respiratory failure with hypoxia: Secondary | ICD-10-CM | POA: Diagnosis not present

## 2020-07-18 LAB — GLUCOSE, CAPILLARY
Glucose-Capillary: 135 mg/dL — ABNORMAL HIGH (ref 70–99)
Glucose-Capillary: 158 mg/dL — ABNORMAL HIGH (ref 70–99)
Glucose-Capillary: 179 mg/dL — ABNORMAL HIGH (ref 70–99)
Glucose-Capillary: 77 mg/dL (ref 70–99)
Glucose-Capillary: 93 mg/dL (ref 70–99)
Glucose-Capillary: 94 mg/dL (ref 70–99)
Glucose-Capillary: 95 mg/dL (ref 70–99)

## 2020-07-18 LAB — BASIC METABOLIC PANEL
Anion gap: 8 (ref 5–15)
BUN: 37 mg/dL — ABNORMAL HIGH (ref 8–23)
CO2: 22 mmol/L (ref 22–32)
Calcium: 8.5 mg/dL — ABNORMAL LOW (ref 8.9–10.3)
Chloride: 103 mmol/L (ref 98–111)
Creatinine, Ser: 2.33 mg/dL — ABNORMAL HIGH (ref 0.61–1.24)
GFR, Estimated: 29 mL/min — ABNORMAL LOW (ref >60)
Glucose, Bld: 145 mg/dL — ABNORMAL HIGH (ref 70–99)
Potassium: 5.2 mmol/L — ABNORMAL HIGH (ref 3.5–5.1)
Sodium: 133 mmol/L — ABNORMAL LOW (ref 135–145)

## 2020-07-18 LAB — PROTIME-INR
INR: 3.2 — ABNORMAL HIGH (ref 0.8–1.2)
Prothrombin Time: 32.4 seconds — ABNORMAL HIGH (ref 11.4–15.2)

## 2020-07-18 LAB — CBC
HCT: 30.6 % — ABNORMAL LOW (ref 39.0–52.0)
Hemoglobin: 9.9 g/dL — ABNORMAL LOW (ref 13.0–17.0)
MCH: 30.1 pg (ref 26.0–34.0)
MCHC: 32.4 g/dL (ref 30.0–36.0)
MCV: 93 fL (ref 80.0–100.0)
Platelets: 143 10*3/uL — ABNORMAL LOW (ref 150–400)
RBC: 3.29 MIL/uL — ABNORMAL LOW (ref 4.22–5.81)
RDW: 14.2 % (ref 11.5–15.5)
WBC: 6 10*3/uL (ref 4.0–10.5)
nRBC: 0 % (ref 0.0–0.2)

## 2020-07-18 LAB — MAGNESIUM: Magnesium: 2.1 mg/dL (ref 1.7–2.4)

## 2020-07-18 LAB — HEPARIN LEVEL (UNFRACTIONATED): Heparin Unfractionated: >1.1 IU/mL — ABNORMAL HIGH (ref 0.30–0.70)

## 2020-07-18 MED ORDER — METHOCARBAMOL 750 MG PO TABS
750.0000 mg | ORAL_TABLET | Freq: Three times a day (TID) | ORAL | Status: DC | PRN
  Administered 2020-07-18 – 2020-07-20 (×3): 750 mg via ORAL
  Filled 2020-07-18 (×5): qty 1

## 2020-07-18 MED ORDER — LIDOCAINE 5 % EX PTCH
1.0000 | MEDICATED_PATCH | CUTANEOUS | Status: DC
  Administered 2020-07-18 – 2020-07-20 (×3): 1 via TRANSDERMAL
  Filled 2020-07-18 (×3): qty 1

## 2020-07-18 NOTE — Consult Note (Signed)
PHARMACY CONSULT NOTE  Pharmacy Consult for Electrolyte Monitoring and Replacement   Recent Labs: Potassium (mmol/L)  Date Value  07/18/2020 5.2 (H)   Magnesium (mg/dL)  Date Value  07/18/2020 2.1   Calcium (mg/dL)  Date Value  07/18/2020 8.5 (L)   Albumin (g/dL)  Date Value  07/10/2020 4.7   Phosphorus (mg/dL)  Date Value  07/17/2020 3.6   Sodium (mmol/L)  Date Value  07/18/2020 133 (L)   Assessment: Patient is a 70 y/o M with medical history including cirrhosis / liver cancer s/p liver transplant in 2018, pulmonary arterial hypertension, diabetes, Afib, BPH, substance abuse who is admitted with acute hypoxic respiratory failure secondary to acute encephalopathy / possible CVA and hyperglycemic emergency / AKI. Pharmacy has been consulted for electrolyte management.   Patient intubated 4/29 - 5/3. Now extubated on RA. On amiodarone. Renal dysfunction persistent. Has a central line. On dysphagia diet. NG tube out.  Goal of Therapy:  K 4-5.1 mmol/L Mg 2-2.4 mg/dL All other electrolytes within normal limits  Plan:  K 5.2  Mag 2.1  Scr 2.33  Na 133 --No electrolyte replacement warranted today Watch K  --Follow-up electrolytes with AM labs   Hector Mueller A 07/18/2020 9:46 AM

## 2020-07-18 NOTE — Progress Notes (Signed)
[place] for heparin infusion and bridge to warfarin Indication: atrial fibrillation  Patient Measurements: Height: 6' (182.9 cm) Weight: 83.5 kg (184 lb) IBW/kg (Calculated) : 77.6   Labs: Recent Labs    07/16/20 0459 07/16/20 0930 07/17/20 0825 07/17/20 0832 07/18/20 0834  HGB 10.6*  --  11.0* 11.1* 9.9*  HCT 31.1*  --  32.8* 32.9* 30.6*  PLT 121*  --  138* 143* 143*  LABPROT 14.2  --  23.3*  --  32.4*  INR 1.1  --  2.1*  --  3.2*  HEPARINUNFRC  --  0.41 0.32  --  >1.10*  CREATININE 2.42*  --  2.69* 2.63* 2.33*    Estimated Creatinine Clearance: 31.5 mL/min (Mueller) (by C-G formula based on SCr of 2.33 mg/dL (H)).   Medical History: Past Medical History:  Diagnosis Date  . Atrial fibrillation ([place])   . BPH (benign prostatic hyperplasia)   . Cancer ([place])    liver  . Cirrhosis ([place])   . Diabetes mellitus without complication ([place])   . GERD (gastroesophageal reflux disease)   . Hepatitis C     Assessment: 70 y/o M with medical history including cirrhosis / liver cancer s/p liver transplant in 2018, pulmonary arterial hypertension, diabetes, Afib, BPH, substance abuse who is admitted with acute hypoxic respiratory failure secondary to acute encephalopathy / possible CVA and hyperglycemic emergency / AKI. Pt not on anticoagulation prior to admission. reports of coffee ground emesis on 4/29 and has since resolved. Pharmacy has been consulted for heparin dosing and monitoring for afib and bridge to warfarin.  Hgb decreased from admission but maintaining ~11 g/dL. Thrombocytopenia improving.   Dosing by wt of 77.6 kg  DDI: amiodarone, cyclosporine LFTs: Albumin: 4.7, Tbili 1.7, BL INR 1  Heparin Date Time HL Rate/Comment 5/2 1702 0.29 Slightly < goal; 1200>>1350 un/hr 5/2 0200 0.35 Therapeutic x 1; 1350 un/hr 5/3 0826 0.32 Therapeutic x 2; 1350 un/hr 5/3 1430 0.24 Subtherapeutic; 1350>> 5/4 0203 0.36 Therapeutic x  1 5/4 1048 0.19 Subtherapeutic, spoke with nurse who stated infusion had been off for ~1.5 hours 5/5 0139 0.30 Therapeutic x 1 5/5 0930 0.41 Therapeutic x 2 5/6 0825 0.32 Therapeutic x 3 5/7 0834 >1.10   Supratherapeutic, MD discontinued  Warfarin  Date INR Dose 5/1 1.1 Baseline INR 5/4 N/Mueller Warfarin 7.5 mg 5/5 1.1 Warfarin 7.5 mg 5/6 2.1 Warfarin 2.5 mg 5/6 3.2 hold   Goal of Therapy:  Heparin level 0.3-0.7 units/ml Monitor platelets by anticoagulation protocol: Yes   Plan:   HL > 1.10 supratherapeutic,MD has discontinued Heparin drip this am 5/7  INR 3.2 -  Large jump again from yesterday.Will hold Warfarin today. Likely secondary to DDI (amiodarone). Likely also from low vitamin K dietary intake  Re-check INR with AM labs  Continue to monitor H&H and platelets   Hector Mueller 07/18/2020 9:30 AM

## 2020-07-18 NOTE — Progress Notes (Addendum)
Progress Note  Patient Name: Hector Mueller Date of Encounter: 07/18/2020  Primary Cardiologist: [facility]  Subjective   Maintaining sinus rhythm. No chest pain, dyspnea, or palpitations. Continues to feel weak.   Inpatient Medications    Scheduled Meds: . amiodarone  400 mg Oral BID  . cycloSPORINE modified  200 mg Oral Daily   And  . cycloSPORINE modified  175 mg Oral QHS  . docusate sodium  100 mg Oral BID  . DULoxetine  30 mg Oral Daily  . feeding supplement  237 mL Oral BID BM  . insulin aspart  0-9 Units Subcutaneous Q4H  . insulin glargine  20 Units Subcutaneous Daily  . mouth rinse  15 mL Mouth Rinse BID  . metoprolol succinate  25 mg Oral BID  . mirtazapine  15 mg Oral QHS  . multivitamin with minerals  1 tablet Oral Daily  . mycophenolate  500 mg Oral BID  . pantoprazole  40 mg Oral QAC breakfast  . polyethylene glycol  17 g Oral Daily  . QUEtiapine  25 mg Oral QHS  . rosuvastatin  5 mg Oral QPM  . sildenafil  40 mg Oral TID  . Warfarin - Pharmacist Dosing Inpatient   Does not apply q1600   Continuous Infusions: . sodium chloride 10 mL/hr at 07/16/20 0500  . heparin 1,500 Units/hr (07/18/20 0018)   PRN Meds: albuterol, docusate sodium, ondansetron (ZOFRAN) IV, polyethylene glycol, polyvinyl alcohol, traZODone   Vital Signs    Vitals:   07/17/20 2244 07/18/20 0438 07/18/20 0450 07/18/20 0741  BP: (!) 168/82  (!) 145/71 (!) 152/69  Pulse: 68  63 65  Resp: 20  20 17   Temp: 98.4 F (36.9 C)  98.2 F (36.8 C) 98.7 F (37.1 C)  TempSrc: Oral  Oral Oral  SpO2: 100%  96% 97%  Weight:  80.7 kg    Height:        Intake/Output Summary (Last 24 hours) at 07/18/2020 0828 Last data filed at 07/18/2020 0424 Gross per 24 hour  Intake 658.22 ml  Output 2800 ml  Net -2141.78 ml   Filed Weights   07/14/20 0500 07/17/20 0519 07/18/20 0438  Weight: 78.5 kg 89.4 kg 80.7 kg    Telemetry    SR - Personally Reviewed  ECG    No new tracings - Personally  Reviewed  Physical Exam   GEN: No acute distress.   Neck: No JVD. Cardiac: RRR, no murmurs, rubs, or gallops.  Respiratory: Clear to auscultation bilaterally.  GI: Soft, nontender, non-distended.   MS: No edema; No deformity. Neuro:  Alert and oriented x 3; Nonfocal.  Psych: Normal affect.  Labs    Chemistry Recent Labs  Lab 07/16/20 0459 07/17/20 0825 07/17/20 0832  NA 139 139 138  K 3.1* 4.6 4.5  CL 109 108 107  CO2 22 23 23   GLUCOSE 66* 77 77  BUN 43* 42* 42*  CREATININE 2.42* 2.69* 2.63*  CALCIUM 7.7* 8.8* 8.6*  GFRNONAA 28* 24* 25*  ANIONGAP 8 8 8      Hematology Recent Labs  Lab 07/16/20 0459 07/17/20 0825 07/17/20 0832  WBC 4.5 6.9 6.9  RBC 3.47* 3.59* 3.66*  HGB 10.6* 11.0* 11.1*  HCT 31.1* 32.8* 32.9*  MCV 89.6 91.4 89.9  MCH 30.5 30.6 30.3  MCHC 34.1 33.5 33.7  RDW 13.8 14.5 14.6  PLT 121* 138* 143*    Cardiac EnzymesNo results for input(s): TROPONINI in the last 168 hours. No results for input(s):  TROPIPOC in the last 168 hours.   BNPNo results for input(s): BNP, PROBNP in the last 168 hours.   DDimer No results for input(s): DDIMER in the last 168 hours.   Radiology    DG Chest Port 1 View  Result Date: 07/15/2020 IMPRESSION: Increasingly patchy and coalescent opacities in the mid to lower lungs. Could reflect developing airspace disease, worsening atelectasis or edema. Electronically Signed   By: Lovena Le M.D.   On: 07/15/2020 03:37   [place] ASCITES (ABDOMEN LIMITED)  Result Date: 07/14/2020 IMPRESSION: 1. No evidence of ascites. Electronically Signed   By: Randa Ngo M.D.   On: 07/14/2020 20:38    Cardiac Studies   2D echo 07/13/2020: 1. Left ventricular ejection fraction, by estimation, is 40 to 45%. The  left ventricle has mild to moderately decreased function. The left  ventricle demonstrates global hypokinesis. Left ventricular diastolic  parameters are consistent with Grade I  diastolic dysfunction (impaired relaxation).   2. Right ventricular systolic function is normal. The right ventricular  size is normal.  3. The mitral valve is grossly normal. No evidence of mitral valve  regurgitation.  4. The aortic valve was not well visualized. Aortic valve regurgitation  is not visualized.  5. Pulmonic valve regurgitation not assessed.  Patient Profile     70 y.o. male with history of SSS s/p Micra pacemaker in 2018, persistent Afib previously on amiodarone and warfarin but stopped by EP team at [facility] in 2019 in setting of no recurrent a-fib and CHADSVASC score of 1, pulmonary hypertensionb, cirrhosis s/p liver transplant in 2018, DM, and HLD admitted with acute hypoxic respiratory failure secondary to AMS in the setting of HHS who we are seeing for Afib with RVR with associated shock requiring emergent cardioversion.   Assessment & Plan    1. Afib with RVR; -Status post emergent DCCV -Maintaining sinus rhythm  -Amiodarone 400 mg bid for a total of 7 days (5/9) followed by 200 mg bid for 7 days, then 200 mg daily thereafter for now -Heparin to Coumadin bridge, INR per pharmacy  -Continue metoprolol succinate 25 mg bid with hold parameters  2. Cardiomyopathy/elevated HS-Tn: -EF 40-45% -Will need ischemic work-up as an outpatient -Toprol XL as above -Not currently on ACEi/ARB/ARNI/MRA/SGLT2i in the context of CKD  3. CKD stage IV: -Slightly above his baseline on last check -Unable to exclude component of ATN given the stressors above  4. AMS: -Head CT without acute infarct  -Mentating better  5. Hypokalemia: -Repleted IV  6. Anemia: -Stable on last check -Monitor on heparin to Coumadin bridge     For questions or updates, please contact [place] Please consult [email or web address] for contact info under Cardiology/STEMI.    Signed, Christell Faith, PA-C [place] Pager: [phone] 07/18/2020, 8:28 AM   Attending Note:   The patient was seen and examined.  Agree with assessment and  plan as noted above.  Changes made to the above note as needed.  Patient seen and independently examined with  Christell Faith, PA .   We discussed all aspects of the encounter. I agree with the assessment and plan as stated above.  1.   Afib with RVR:   Continue amio taper Amiodarone 400 mg bid for a total of 7 days (5/9) followed by 200 mg bid for 7 days, then 200 mg daily thereafter for   INR is theraputic. Follow up with his cardiodiology team in [facility]   He is stable from a cardiac  standpoint  We will sign off.   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  Grade 1 diastolic dysfunction .  He will need further work up as OP .  3.  CKD:   Creatinine is 2.33 today   4.  Liver transplant:     I have spent a total of 40 minutes with patient reviewing hospital  notes , telemetry, EKGs, labs and examining patient as well as establishing an assessment and plan that was discussed with the patient. > 50% of time was spent in direct patient care.   [facility] will sign off.   Medication Recommendations:  Cont amio taper as outlined above.  Other recommendations (labs, testing, etc):   Follow up as an outpatient:  With his cardiologist in [facility]    Thayer Headings, Brooke Bonito., MD, [facility] 07/18/2020, 10:34 AM [address],  [place] Pager [phone]

## 2020-07-18 NOTE — Progress Notes (Signed)
  Speech Language Pathology Treatment: Dysphagia  Patient Details Name: Hector Mueller MRN: [id number] DOB: [birth year] Today's Date: 07/18/2020 Time: 5093-2671 SLP Time Calculation (min) (ACUTE ONLY): 12 min  Assessment / Plan / Recommendation Clinical Impression  Pt stated that he wasn't feeling well, felt nauseated. Pt's nurse doesn't report any difficulty with current diet. Pt agreeable to consuming some Ginger Ale and saltines. He continues with adequate oropharyngeal abilities with current diet and is agreeable to remaining on dysphagia 3 with thin liquids as it is "easier." Will defer any further diet advancement to next venue of care.    HPI HPI: Patient is 56yoM with hx of DM, HTN, HLD, s/p liver transplant a-fib off AC who was found unresponsive in setting of hyperglycemia.Pt on vent support from 4/29 thru 07/14/2020. Chest x-ray today reveals Increasingly patchy and coalescent opacities in the mid to lower lungs. Could reflect developing airspace disease, worsening  atelectasis or edema.      SLP Plan  All goals met       Recommendations  Diet recommendations: Dysphagia 3 (mechanical soft);Thin liquid Liquids provided via: Straw;Cup Medication Administration: Whole meds with puree Supervision: Patient able to self feed Compensations: Minimize environmental distractions;Slow rate;Small sips/bites Postural Changes and/or Swallow Maneuvers: Seated upright 90 degrees                Oral Care Recommendations: Oral care BID Follow up Recommendations: None SLP Visit Diagnosis: Dysphagia, unspecified (R13.10) Plan: All goals met       GO               Matteson Blue B. Rutherford Nail M.S., CCC-SLP, [place] Office [phone]  Stormy Fabian 07/18/2020, 12:25 PM

## 2020-07-18 NOTE — Progress Notes (Signed)
PROGRESS NOTE    Hector Mueller  GUR:[id number] DOB: 1950-07-23 DOA: 07/10/2020 PCP: [name], MD   Brief Narrative:  70 year old male who was brought to the [facility] ED due to altered mental status.  Per ED documentation the patient was reportedly in his usual state of health last night on 07/09/20 until about 11:30 PM.  This morning the patient was noted to have difficulty walking, but was talking and interacting normally with family.  However today at noon he was found to be unable to stand or move purposefully as well as being unable to speak. Extubated successfully on 5/3.  Mental status returning to baseline on 5/4.  Patient status post DC cardioversion on 5/2 with successful restoration of sinus rhythm.  Seen by cardiology and converted to p.o. amiodarone.  Seen by SLP and diet advanced as well. 5/5: Per nursing patient had some hallucinations and altered mentation overnight.  Endorsed poor sleep.  Was appropriate with me this morning.  Does endorse some lethargy. 5/6: No acute status changes.  Interestingly INR went from 1.1-2.1 in 24 hours.  Suspect impact from amiodarone. 5/7: INR therapeutic.  Heparin drip stopped   Assessment & Plan:   Active Problems:   Acute respiratory failure (HCC)   Atrial fibrillation (HCC)   HFrEF (heart failure with reduced ejection fraction) (HCC)  Acute Hypoxic Respiratory Failure secondary to acute encephalopathy in the setting of HHS Acute stroke was ruled out Acute encephalopathy, multifactorial. metabolic/ suspected CVA Poorly controlled diabetes type 2 presented with HHS and now again with hyperglycemia CT imaging survey negative for acute stroke/LVO.  MRI not possible due to pacer.  Mental status back at baseline.  Tolerated extubation well.  Suspected etiology of encephalopathy and airway compromise due to HHS. Plan: Optimize glycemic control Oxygen as necessary, wean to room air Discharge planning  Paroxysmal A. fib status post  cardioversion Status post DC cardioversion on 5/2.  Tolerated well.  Seen by cardiology this morning.  Transition to p.o. amiodarone. 5/6 INR 2.1 5/7 INR 3.2 Plan: Discontinue heparin GTT Continue Coumadin with pharmacy dosing assistance Daily INRs Continue amiodarone 400 mg twice daily x7 days, followed by 200 twice daily x7 days, followed by 200 daily thereafter  Acute systolic congestive heart failure Echocardiogram was done which showed EF 40 to 45% He will need to be started on beta-blocker/ACE once blood pressure improves Plan: No further recommendations from cardiology standpoint Further work-up as outpatient  Acute Kidney Injury on CKD Baseline Cr: 1.6, Cr on admission: 3.35, likely prerenal Suspect prerenal azotemia in the setting of respiratory compromise.  Creatinine improving.  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 No more coffee-ground emesis H&H remained stable Continue cyclosporine and mycophenolate at home Follow cyclosporine level Monitor for signs and symptoms of bleeding Transfuse if hemoglobin less than 7  Pulmonary arterial hypertension Who class I Hold sildenafil due to hypotension Continuous cardiac monitoring   DVT prophylaxis: Warfarin Code Status: Full Family Communication: Spouse Lakin Romer [phone] on 5/7 Disposition Plan: Status is: Inpatient  Remains inpatient appropriate because:Inpatient level of care appropriate due to severity of illness   Dispo: The patient is from: Home              Anticipated d/c is to: SNF versus home with home health              Patient currently is not medically stable to d/c.   Difficult to place  patient No  Respiratory failure resolved.  No further cardiology recommendations.  No further pulmonary recommendations.  INR therapeutic.  Patient stable for discharge at this time.  Need skilled nursing facility.  TOC  aware and bed search initiated.     Level of care: Progressive Cardiac  Consultants:   Cardiology  Procedures:   DC cardioversion  Endotracheal intubation  Antimicrobials:   None   Subjective: Patient seen and examined.  Continues to endorse weakness.  Resting in bed.  Visibly no distress.  No pain complaints.  Objective: Vitals:   07/17/20 2244 07/18/20 0438 07/18/20 0450 07/18/20 0741  BP: (!) 168/82  (!) 145/71 (!) 152/69  Pulse: 68  63 65  Resp: 20  20 17   Temp: 98.4 F (36.9 C)  98.2 F (36.8 C) 98.7 F (37.1 C)  TempSrc: Oral  Oral Oral  SpO2: 100%  96% 97%  Weight:  80.7 kg  83.5 kg  Height:    6' (1.829 m)    Intake/Output Summary (Last 24 hours) at 07/18/2020 1137 Last data filed at 07/18/2020 0914 Gross per 24 hour  Intake 478.98 ml  Output 2420 ml  Net -1941.02 ml   Filed Weights   07/17/20 0519 07/18/20 0438 07/18/20 0741  Weight: 89.4 kg 80.7 kg 83.5 kg    Examination:  General exam: No acute distress.  Appears fatigued.  Hoarse voice Respiratory system: Mild scattered crackles bilaterally.  Normal work of breathing.  Room air Cardiovascular system: S1-S2.  Regular rate and rhythm.  No murmurs.  No pedal edema Gastrointestinal system: Abdomen is nondistended, soft and nontender. No organomegaly or masses felt. Normal bowel sounds heard. Central nervous system: Alert and oriented. No focal neurological deficits. Extremities: Symmetric 5 x 5 power. Skin: No rashes, lesions or ulcers Psychiatry: Judgement and insight appear impaired. Mood & affect flattened.     Data Reviewed: I have personally reviewed following labs and imaging studies  CBC: Recent Labs  Lab 07/13/20 0318 07/13/20 1004 07/15/20 0500 07/16/20 0459 07/17/20 0825 07/17/20 0832 07/18/20 0834  WBC 6.6   < > 4.8 4.5 6.9 6.9 6.0  NEUTROABS 5.0  --   --   --   --   --   --   HGB 11.8*   < > 10.4* 10.6* 11.0* 11.1* 9.9*  HCT 34.7*   < > 29.5* 31.1* 32.8* 32.9* 30.6*   MCV 89.4   < > 87.5 89.6 91.4 89.9 93.0  PLT 119*   < > 94* 121* 138* 143* 143*   < > = values in this interval not displayed.   Basic Metabolic Panel: Recent Labs  Lab 07/13/20 1702 07/14/20 0200 07/14/20 1630 07/15/20 0500 07/16/20 0459 07/17/20 0825 07/17/20 0832 07/18/20 0834  NA  --  132*  --  138 139 139 138 133*  K  --  4.0   < > 3.2* 3.1* 4.6 4.5 5.2*  CL  --  100  --  106 109 108 107 103  CO2  --  22  --  24 22 23 23 22   GLUCOSE  --  451*  --  123* 66* 77 77 145*  BUN  --  46*  --  46* 43* 42* 42* 37*  CREATININE  --  2.85*  --  2.60* 2.42* 2.69* 2.63* 2.33*  CALCIUM  --  7.7*  --  8.4* 7.7* 8.8* 8.6* 8.5*  MG 2.3 2.3  --  2.1 1.9 2.5*  --  2.1  PHOS 3.2 2.6  --  2.3* 3.1 3.6  --   --    < > = values in this interval not displayed.   GFR: Estimated Creatinine Clearance: 31.5 mL/min (A) (by C-G formula based on SCr of 2.33 mg/dL (H)). Liver Function Tests: No results for input(s): AST, ALT, ALKPHOS, BILITOT, PROT, ALBUMIN in the last 168 hours. No results for input(s): LIPASE, AMYLASE in the last 168 hours. Recent Labs  Lab 07/13/20 0842  AMMONIA <9*   Coagulation Profile: Recent Labs  Lab 07/12/20 0932 07/16/20 0459 07/17/20 0825 07/18/20 0834  INR 1.1 1.1 2.1* 3.2*   Cardiac Enzymes: No results for input(s): CKTOTAL, CKMB, CKMBINDEX, TROPONINI in the last 168 hours. BNP (last 3 results) No results for input(s): PROBNP in the last 8760 hours. HbA1C: No results for input(s): HGBA1C in the last 72 hours. CBG: Recent Labs  Lab 07/17/20 1546 07/17/20 2020 07/18/20 0042 07/18/20 0419 07/18/20 0742  GLUCAP 143* 119* 95 94 93   Lipid Profile: No results for input(s): CHOL, HDL, LDLCALC, TRIG, CHOLHDL, LDLDIRECT in the last 72 hours. Thyroid Function Tests: No results for input(s): TSH, T4TOTAL, FREET4, T3FREE, THYROIDAB in the last 72 hours. Anemia Panel: No results for input(s): VITAMINB12, FOLATE, FERRITIN, TIBC, IRON, RETICCTPCT in the last 72  hours. Sepsis Labs: Recent Labs  Lab 07/12/20 0140 07/13/20 0318  PROCALCITON 0.57 2.95    Recent Results (from the past 240 hour(s))  Resp Panel by RT-PCR (Flu A&B, Covid) Nasopharyngeal Swab     Status: None   Collection Time: 07/10/20 10:05 PM   Specimen: Nasopharyngeal Swab; Nasopharyngeal(NP) swabs in vial transport medium  Result Value Ref Range Status   SARS Coronavirus 2 by RT PCR NEGATIVE NEGATIVE Final    Comment: (NOTE) SARS-CoV-2 target nucleic acids are NOT DETECTED.  The SARS-CoV-2 RNA is generally detectable in upper respiratory specimens during the acute phase of infection. The lowest concentration of SARS-CoV-2 viral copies this assay can detect is 138 copies/mL. A negative result does not preclude SARS-Cov-2 infection and should not be used as the sole basis for treatment or other patient management decisions. A negative result may occur with  improper specimen collection/handling, submission of specimen other than nasopharyngeal swab, presence of viral mutation(s) within the areas targeted by this assay, and inadequate number of viral copies(<138 copies/mL). A negative result must be combined with clinical observations, patient history, and epidemiological information. The expected result is Negative.  Fact Sheet for Patients:  EntrepreneurPulse.com.au  Fact Sheet for Healthcare Providers:  IncredibleEmployment.be  This test is no t yet approved or cleared by the [place] FDA and  has been authorized for detection and/or diagnosis of SARS-CoV-2 by FDA under an Emergency Use Authorization (EUA). This EUA will remain  in effect (meaning this test can be used) for the duration of the COVID-19 declaration under Section 564(b)(1) of the Act, 21 U.S.C.section 360bbb-3(b)(1), unless the authorization is terminated  or revoked sooner.       Influenza A by PCR NEGATIVE NEGATIVE Final   Influenza B by PCR NEGATIVE  NEGATIVE Final    Comment: (NOTE) The Xpert Xpress SARS-CoV-2/FLU/RSV plus assay is intended as an aid in the diagnosis of influenza from Nasopharyngeal swab specimens and should not be used as a sole basis for treatment. Nasal washings and aspirates are unacceptable for Xpert Xpress SARS-CoV-2/FLU/RSV testing.  Fact Sheet for Patients: EntrepreneurPulse.com.au  Fact Sheet for Healthcare Providers: IncredibleEmployment.be  This test is not yet approved or cleared by the [place] FDA and has been  authorized for detection and/or diagnosis of SARS-CoV-2 by FDA under an Emergency Use Authorization (EUA). This EUA will remain in effect (meaning this test can be used) for the duration of the COVID-19 declaration under Section 564(b)(1) of the Act, 21 U.S.C. section 360bbb-3(b)(1), unless the authorization is terminated or revoked.  Performed at [facility], [address] [place], [place] [address]   Blood culture (single)     Status: None   Collection Time: 07/10/20 11:53 PM   Specimen: BLOOD  Result Value Ref Range Status   Specimen Description BLOOD RIGHT [facility]   Final   Special Requests   Final    BOTTLES DRAWN AEROBIC AND ANAEROBIC Blood Culture adequate volume   Culture   Final    NO GROWTH 5 DAYS Performed at [facility], [address] [place], [place] [address]    Report Status 07/16/2020 FINAL  Final  MRSA PCR Screening     Status: None   Collection Time: 07/11/20  2:00 AM   Specimen: Nasopharyngeal  Result Value Ref Range Status   MRSA by PCR NEGATIVE NEGATIVE Final    Comment:        The GeneXpert MRSA Assay (FDA approved for NASAL specimens only), is one component of a comprehensive MRSA colonization surveillance program. It is not intended to diagnose MRSA infection nor to guide or monitor treatment for MRSA infections. Performed at [facility], [address] [place], [place]  [address]   Culture, Respiratory w Gram Stain     Status: None   Collection Time: 07/12/20 10:41 AM   Specimen: Tracheal Aspirate; Respiratory  Result Value Ref Range Status   Specimen Description   Final    TRACHEAL ASPIRATE Performed at [facility], [address], [place], [place] [address]    Special Requests   Final    NONE Performed at [facility], [address] [place], [place] [address]    Gram Stain   Final    RARE WBC PRESENT, PREDOMINANTLY PMN NO ORGANISMS SEEN    Culture   Final    Normal respiratory flora-no Staph aureus or Pseudomonas seen Performed at [facility] [address] [place], [place] [address]    Report Status 07/14/2020 FINAL  Final         Radiology Studies: No results found.      Scheduled Meds: . amiodarone  400 mg Oral BID  . cycloSPORINE modified  200 mg Oral Daily   And  . cycloSPORINE modified  175 mg Oral QHS  . docusate sodium  100 mg Oral BID  . DULoxetine  30 mg Oral Daily  . feeding supplement  237 mL Oral BID BM  . insulin aspart  0-9 Units Subcutaneous Q4H  . insulin glargine  20 Units Subcutaneous Daily  . lidocaine  1 patch Transdermal Q24H  . mouth rinse  15 mL Mouth Rinse BID  . metoprolol succinate  25 mg Oral BID  . mirtazapine  15 mg Oral QHS  . multivitamin with minerals  1 tablet Oral Daily  . mycophenolate  500 mg Oral BID  . pantoprazole  40 mg Oral QAC breakfast  . polyethylene glycol  17 g Oral Daily  . QUEtiapine  25 mg Oral QHS  . rosuvastatin  5 mg Oral QPM  . sildenafil  40 mg Oral TID  . Warfarin - Pharmacist Dosing Inpatient   Does not apply q1600   Continuous Infusions: . sodium chloride 10  mL/hr at 07/16/20 0500     LOS: 7 days    Time spent: 15 minutes    Sidney Ace, MD [facility] Pager [phone]  If 7PM-7AM, please contact night-coverage 07/18/2020, 11:37 AM

## 2020-07-18 NOTE — Progress Notes (Signed)
NAME:  Hector Mueller, MRN:  [id number], DOB:  January 09, 1951, LOS: 7 ADMISSION DATE:  07/10/2020, CONSULTATION DATE: 07/10/2020 REFERRING MD: Dr. Joni Fears, CHIEF COMPLAINT: Altered mental status  History of Present Illness:  70 year old male who was brought to the [facility] ED due to altered mental status.  Per ED documentation the patient was reportedly in his usual state of health last night on 07/09/20 until about 11:30 PM.  This morning the patient was noted to have difficulty walking, but was talking and interacting normally with family.  However today at noon he was found to be unable to stand or move purposefully as well as being unable to speak.         Events: 07/12/20- I met with family (wife and niece) updated on plan and reviewed findings. Patient with improved HHS, weaning on PRVC, coffe ground NG material resolved.   07/17/20- Patient is improved , he has central jugular line on right neck that will be removed. He is ready for more aggressive PT/OT.  He is lucid no pain reported.  He still has electrolyte derrangements and there is pharmacy consult to help repletion. His sugars are better but still being monitored closely.   07/18/20- Patient is stable on room air, no overnight events. PCCM will sign off and can follow up on outpatient if needed for pulmonary hypertension.       Pertinent  Medical History  Cirrhosis status post liver transplant in 2018 Pulmonary arterial hypertension Hepatitis C Type 2 diabetes mellitus Atrial fibrillation BPH Liver cancer Substance abuse Tracheostomy Hyperlipidemia   Interim History / Subjective:  Patient intubated and sedated, unable to follow commands at this time.  Objective   Blood pressure (!) 152/69, pulse 65, temperature 98.7 F (37.1 C), temperature source Oral, resp. rate 17, height 6' (1.829 m), weight 83.5 kg, SpO2 97 %.        Intake/Output Summary (Last 24 hours) at 07/18/2020 1023 Last data filed at 07/18/2020 0914 Gross per 24  hour  Intake 538.22 ml  Output 3020 ml  Net -2481.78 ml   Filed Weights   07/17/20 0519 07/18/20 0438 07/18/20 0741  Weight: 89.4 kg 80.7 kg 83.5 kg    Examination: General: Adult male, age appropriat nad 70: MM pink/moist, anicteric, atraumatic, neck supple Neuro: grossly intact no fnd CV: s1s2 RRR, NSR on monitor, no r/m/g Pulm: Regular, non labored cTAB GI: soft, rounded, non tender, bs x 4 Skin: Limited exam- no rashes/lesions noted Extremities: warm/dry, pulses + 2 R/P, trace edema noted BLE    Imaging    IMPRESSION: 1. Negative CTA for emergent large vessel occlusion. 2. Negative CT perfusion with no evidence for acute ischemia or other perfusion abnormality. 3. Severe distal right P3 stenoses. 4. Otherwise wide patency of the major arterial vasculature of the head and neck. No other hemodynamically significant or correctable stenosis. 5. Question diffuse circumferential esophageal wall thickening, which could reflect changes of acute esophagitis and/or reflux disease.   Assessment & Plan:  Acute Hypoxic Respiratory Failure secondary to acute encephalopathy in the setting of HHS and possible CVA- RESOLVED  - Ventilator settings: PRVC  8 mL/kg, 40% FiO2, 5 PEEP, continue ventilator support & lung protective strategies - Wean PEEP & FiO2 as tolerated, maintain SpO2 > 90% - Head of bed elevated 30 degrees, VAP protocol in place - Plateau pressures less than 30 cm H20  - Intermittent chest x-ray & ABG PRN - Daily WUA with SBT as tolerated  - Ensure adequate pulmonary hygiene  -  F/u cultures, trend PCT - Continue Aspiration Pna coverage unasyn - bronchodilators PRN - PAD protocol in place: continue Fentanyl & Propofol              -patient was aggitated with failure of SBT difficult to sedate    HHS PMHx: T2DM -improved - follow DKA protocol   Acute encephalopathy due to suspected CVA-resolved due to DKA Initial imaging negative for any intracranial  abnormality.  Patient is outside the window for tPA, negative for LVO. -Supportive care -We will need follow-up MRI -Consider neurological consult as needed -Frequent neuro checks, wean sedatives as tolerated  Acute Kidney Injury on CKD Baseline Cr: 1.6, Cr on admission: 3.35 - Strict I/O's: alert provider if UOP < 0.5 mL/kg/hr - gentle IVF hydration  - Daily BMP, replace electrolytes PRN - Avoid nephrotoxic agents as able, ensure adequate renal perfusion - Consult nephrology if iHD or CRRT indicated  - consider renal US  Coffee-ground emesis-Resolved Thrombocytopenia  Cirrhosis status post liver transplant in 2018 Minimal coffee-ground emesis from OG tube, patient hemodynamically stable even on sedation and CBC grossly normal.  Will monitor for now Patient takes cyclosporine outpatient. -Continue cyclosporine home regimen -Trend hepatic function -Daily CBC -Monitor for signs and symptoms of bleeding -Transfuse if hemoglobin less than 7 - Consider transfusion of platelets if < 10  Pulmonary arterial hypertension Who class I -Continue home sildenafil -Continuous cardiac monitoring -Consult cardiology if unable to tolerate home regimen  Best practice (right click and "Reselect all SmartList Selections" daily)  Diet:  NPO Pain/Anxiety/Delirium protocol (if indicated): Yes (RASS goal -1) VAP protocol (if indicated): Yes DVT prophylaxis: Subcutaneous Heparin GI prophylaxis: PPI Glucose control:  Insulin gtt Central venous access:  N/A Arterial line:  N/A Foley:  Yes, and it is still needed Mobility:  bed rest  PT consulted: N/A Last date of multidisciplinary goals of care discussion 07/10/20 Code Status:  full code Disposition: ICU  Labs   CBC: Recent Labs  Lab 07/13/20 0318 07/13/20 1004 07/15/20 0500 07/16/20 0459 07/17/20 0825 07/17/20 0832 07/18/20 0834  WBC 6.6   < > 4.8 4.5 6.9 6.9 6.0  NEUTROABS 5.0  --   --   --   --   --   --   HGB 11.8*   < > 10.4*  10.6* 11.0* 11.1* 9.9*  HCT 34.7*   < > 29.5* 31.1* 32.8* 32.9* 30.6*  MCV 89.4   < > 87.5 89.6 91.4 89.9 93.0  PLT 119*   < > 94* 121* 138* 143* 143*   < > = values in this interval not displayed.    Basic Metabolic Panel: Recent Labs  Lab 07/13/20 1702 07/14/20 0200 07/14/20 1630 07/15/20 0500 07/16/20 0459 07/17/20 0825 07/17/20 0832 07/18/20 0834  NA  --  132*  --  138 139 139 138 133*  K  --  4.0   < > 3.2* 3.1* 4.6 4.5 5.2*  CL  --  100  --  106 109 108 107 103  CO2  --  22  --  _0 GLUCOSE  --  451*  --  123* 66* 77 77 145*  BUN  --  46*  --  46* 43* 42* 42* 37*  CREATININE  --  2.85*  --  2.60* 2.42* 2.69* 2.63* 2.33*  CALCIUM  --  7.7*  --  8.4* 7.7* 8.8* 8.6* 8.5*  MG 2.3 2.3  --  2.1 1.9 2.5*  --  2.1  PHOS 3.2 2.6  --  2.3* 3.1 3.6  --   --    < > = values in this interval not displayed.   GFR: Estimated Creatinine Clearance: 31.5 mL/min (A) (by C-G formula based on SCr of 2.33 mg/dL (H)). Recent Labs  Lab 07/12/20 0140 07/13/20 0318 07/14/20 0200 07/16/20 0459 07/17/20 0825 07/17/20 0832 07/18/20 0834  PROCALCITON 0.57 2.95  --   --   --   --   --   WBC 11.9* 6.6   < > 4.5 6.9 6.9 6.0   < > = values in this interval not displayed.    Liver Function Tests: No results for input(s): AST, ALT, ALKPHOS, BILITOT, PROT, ALBUMIN in the last 168 hours. No results for input(s): LIPASE, AMYLASE in the last 168 hours. Recent Labs  Lab 07/13/20 0842  AMMONIA <9*    ABG    Component Value Date/Time   PHART 7.36 07/11/2020 0013   PCO2ART 36 07/11/2020 0013   PO2ART 58 (L) 07/11/2020 0013   HCO3 20.3 07/11/2020 0013   ACIDBASEDEF 4.5 (H) 07/11/2020 0013   O2SAT 88.5 07/11/2020 0013     Coagulation Profile: Recent Labs  Lab 07/12/20 0932 07/16/20 0459 07/17/20 0825 07/18/20 0834  INR 1.1 1.1 2.1* 3.2*    Cardiac Enzymes: No results for input(s): CKTOTAL, CKMB, CKMBINDEX, TROPONINI in the last 168 hours.  HbA1C: Hgb A1c MFr Bld   Date/Time Value Ref Range Status  07/11/2020 03:16 AM 10.8 (H) 4.8 - 5.6 % Final    Comment:    (NOTE) Pre diabetes:          5.7%-6.4%  Diabetes:              >6.4%  Glycemic control for   <7.0% adults with diabetes     CBG: Recent Labs  Lab 07/17/20 1546 07/17/20 2020 07/18/20 0042 07/18/20 0419 07/18/20 0742  GLUCAP 143* 119* 95 94 93    Review of Systems:   UTA patient intubated and sedated  Past Medical History:  He,  has a past medical history of Atrial fibrillation ([place]), BPH (benign prostatic hyperplasia), Cancer ([place]), Cirrhosis ([place]), Diabetes mellitus without complication ([place]), GERD (gastroesophageal reflux disease), and Hepatitis C.   Surgical History:   Past Surgical History:  Procedure Laterality Date  . ANKLE FRACTURE SURGERY Bilateral   . BACK SURGERY    . CLAVICLE SURGERY    . INTRAMEDULLARY (IM) NAIL INTERTROCHANTERIC Right 11/28/2017   Procedure: INTRAMEDULLARY (IM) NAIL INTERTROCHANTRIC;  Surgeon: Earnestine Leys, MD;  Location: [facility] ORS;  Service: Orthopedics;  Laterality: Right;  . LIVER SURGERY       Social History:   reports that he has never smoked. He has never used smokeless tobacco. He reports that he does not drink alcohol and does not use drugs.   Family History:  His family history is not on file.   Allergies No Known Allergies   Home Medications  Prior to Admission medications   Medication Sig Start Date End Date Taking? Authorizing Provider  alendronate (FOSAMAX) 70 MG tablet  10/27/17   [provider]  aspirin 81 MG chewable tablet Chew 1 tablet (81 mg total) by mouth daily. 12/02/17   Vaughan Basta, MD  bisacodyl (DULCOLAX) 5 MG EC tablet Take 1 tablet (5 mg total) by mouth daily as needed for moderate constipation. 12/01/17   Vaughan Basta, MD  calcium carbonate (TUMS - DOSED IN MG ELEMENTAL CALCIUM) 500 MG chewable tablet Chew 2 tablets (400 mg  of elemental calcium total) by mouth daily as needed  for indigestion or heartburn. 12/01/17   Vaughan Basta, MD  Cholecalciferol (VITAMIN D3) 2000 units capsule Take 2,000 Units by mouth daily.    [provider]  cycloSPORINE modified (NEORAL) 100 MG capsule Take 100 mg by mouth 2 (two) times daily. Take along with 3 x 25 mg cap twice a day    [provider]  cycloSPORINE modified (NEORAL) 25 MG capsule Take 75 mg by mouth 2 (two) times daily. Take with 100 mg cap twice a day    [provider]  docusate sodium (COLACE) 100 MG capsule Take 1 capsule (100 mg total) by mouth 2 (two) times daily. 12/01/17   Vaughan Basta, MD  enoxaparin (LOVENOX) 40 MG/0.4ML injection Inject 0.4 mLs (40 mg total) into the skin daily for 12 days. 12/02/17 12/14/17  Vaughan Basta, MD  ferrous sulfate 325 (65 FE) MG tablet Take 1 tablet (325 mg total) by mouth 2 (two) times daily with a meal. 12/01/17   Vaughan Basta, MD  HYDROcodone-acetaminophen (NORCO/VICODIN) 5-325 MG tablet Take 1 tablet by mouth every 4 (four) hours as needed for moderate pain or severe pain (pain score 4-6). 12/01/17   Vaughan Basta, MD  insulin aspart (NOVOLOG) 100 UNIT/ML FlexPen Inject 6 Units into the skin 3 (three) times daily with meals.  12/09/16 02/06/18  [provider]  Insulin Glargine (LANTUS SOLOSTAR) 100 UNIT/ML Solostar Pen INJECT 12 UNITS SUBCUTANEOUSLY (UNDER SKIN) NIGHTLY 12/09/16 12/09/17  [provider]  Melatonin 3 MG TABS Take by mouth. 02/06/17   [provider]  mirtazapine (REMERON) 15 MG tablet Take 15 mg by mouth at bedtime.  10/18/17   [provider]  Multiple Vitamin (MULTIVITAMIN) tablet Take 1 tablet by mouth daily.    [provider]  ondansetron (ZOFRAN) 4 MG tablet Take 1 tablet (4 mg total) by mouth every 6 (six) hours as needed for nausea. 12/01/17   Vaughan Basta, MD  senna (SENOKOT) 8.6 MG TABS tablet Take 1 tablet (8.6 mg total) by mouth 2 (two)  times daily. 12/01/17   Vaughan Basta, MD  sildenafil (REVATIO) 20 MG tablet Take 40 mg by mouth 3 (three) times daily.  10/17/17   [provider]  Specialty Vitamins Products (MAGNESIUM, AMINO ACID CHELATE,) 133 MG tablet Take by mouth. 03/02/17   [provider]  spironolactone (ALDACTONE) 50 MG tablet Take 50 mg by mouth daily. 01/07/14   [provider]            Ottie Glazier, M.D.  [facility] [place]

## 2020-07-19 DIAGNOSIS — J9601 Acute respiratory failure with hypoxia: Secondary | ICD-10-CM | POA: Diagnosis not present

## 2020-07-19 LAB — BASIC METABOLIC PANEL
Anion gap: 7 (ref 5–15)
BUN: 33 mg/dL — ABNORMAL HIGH (ref 8–23)
CO2: 24 mmol/L (ref 22–32)
Calcium: 9.1 mg/dL (ref 8.9–10.3)
Chloride: 106 mmol/L (ref 98–111)
Creatinine, Ser: 2.31 mg/dL — ABNORMAL HIGH (ref 0.61–1.24)
GFR, Estimated: 29 mL/min — ABNORMAL LOW (ref >60)
Glucose, Bld: 67 mg/dL — ABNORMAL LOW (ref 70–99)
Potassium: 4.8 mmol/L (ref 3.5–5.1)
Sodium: 137 mmol/L (ref 135–145)

## 2020-07-19 LAB — GLUCOSE, CAPILLARY
Glucose-Capillary: 73 mg/dL (ref 70–99)
Glucose-Capillary: 122 mg/dL — ABNORMAL HIGH (ref 70–99)
Glucose-Capillary: 95 mg/dL (ref 70–99)
Glucose-Capillary: 84 mg/dL (ref 70–99)
Glucose-Capillary: 150 mg/dL — ABNORMAL HIGH (ref 70–99)

## 2020-07-19 LAB — PROTIME-INR
INR: 2.5 — ABNORMAL HIGH (ref 0.8–1.2)
Prothrombin Time: 26.6 seconds — ABNORMAL HIGH (ref 11.4–15.2)

## 2020-07-19 LAB — MAGNESIUM: Magnesium: 2 mg/dL (ref 1.7–2.4)

## 2020-07-19 MED ORDER — INSULIN GLARGINE 100 UNIT/ML ~~LOC~~ SOLN
15.0000 [IU] | Freq: Every day | SUBCUTANEOUS | Status: DC
  Administered 2020-07-19 – 2020-07-20 (×2): 15 [IU] via SUBCUTANEOUS
  Filled 2020-07-19 (×2): qty 0.15

## 2020-07-19 MED ORDER — WARFARIN SODIUM 2.5 MG PO TABS
2.5000 mg | ORAL_TABLET | Freq: Once | ORAL | Status: AC
  Administered 2020-07-19: 2.5 mg via ORAL
  Filled 2020-07-19: qty 1

## 2020-07-19 MED ORDER — ONDANSETRON HCL 4 MG/5ML PO SOLN
4.0000 mg | Freq: Three times a day (TID) | ORAL | Status: DC | PRN
  Filled 2020-07-19: qty 5

## 2020-07-19 NOTE — Plan of Care (Signed)
  Problem: Clinical Measurements: Goal: Ability to maintain clinical measurements within normal limits will improve Outcome: Progressing   Problem: Clinical Measurements: Goal: Respiratory complications will improve Outcome: Progressing   Problem: Clinical Measurements: Goal: Cardiovascular complication will be avoided Outcome: Progressing   Problem: Safety: Goal: Ability to remain free from injury will improve Outcome: Progressing

## 2020-07-19 NOTE — Progress Notes (Signed)
PROGRESS NOTE    Hector Mueller  RFX:[id number] DOB: 09/30/1950 DOA: 07/10/2020 PCP: [name], MD   Brief Narrative:  70 year old male who was brought to the [facility] ED due to altered mental status.  Per ED documentation the patient was reportedly in his usual state of health last night on 07/09/20 until about 11:30 PM.  This morning the patient was noted to have difficulty walking, but was talking and interacting normally with family.  However today at noon he was found to be unable to stand or move purposefully as well as being unable to speak. Extubated successfully on 5/3.  Mental status returning to baseline on 5/4.  Patient status post DC cardioversion on 5/2 with successful restoration of sinus rhythm.  Seen by cardiology and converted to p.o. amiodarone.  Seen by SLP and diet advanced as well. 5/5: Per nursing patient had some hallucinations and altered mentation overnight.  Endorsed poor sleep.  Was appropriate with me this morning.  Does endorse some lethargy. 5/6: No acute status changes.  Interestingly INR went from 1.1-2.1 in 24 hours.  Suspect impact from amiodarone. 5/7: INR therapeutic.  Heparin drip stopped   Assessment & Plan:   Active Problems:   Acute respiratory failure (HCC)   Atrial fibrillation (HCC)   HFrEF (heart failure with reduced ejection fraction) (HCC)  Acute Hypoxic Respiratory Failure secondary to acute encephalopathy in the setting of HHS Acute stroke was ruled out Acute encephalopathy, multifactorial. metabolic/ suspected CVA Poorly controlled diabetes type 2 presented with HHS and now again with hyperglycemia CT imaging survey negative for acute stroke/LVO.  MRI not possible due to pacer.  Mental status back at baseline.  Tolerated extubation well.  Suspected etiology of encephalopathy and airway compromise due to HHS. Plan: Continue optimal glycemic control Patient medically stable for discharge  Paroxysmal A. fib status post  cardioversion Status post DC cardioversion on 5/2.  Tolerated well.  Seen by cardiology this morning.  Transition to p.o. amiodarone. 5/6 INR 2.1 5/7 INR 3.2 5/8 INR 2.5 Plan: Continue Coumadin with pharmacy dosing assistance and daily INRs Continue amiodarone 400 mg twice daily x7 days, followed by 200 twice daily x7 days, followed by 200 daily thereafter  Acute systolic congestive heart failure Echocardiogram was done which showed EF 40 to 45% He will need to be started on beta-blocker/ACE once blood pressure improves Plan: No further recommendations from cardiology standpoint Further work-up as outpatient  Acute Kidney Injury on CKD Baseline Cr: 1.6, Cr on admission: 3.35, likely prerenal Suspect prerenal azotemia in the setting of respiratory compromise.  Creatinine improving.  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 No more coffee-ground emesis H&H remained stable Continue cyclosporine and mycophenolate at home Follow cyclosporine level Monitor for signs and symptoms of bleeding Transfuse if hemoglobin less than 7  Pulmonary arterial hypertension Who class I Hold sildenafil due to hypotension Continuous cardiac monitoring   DVT prophylaxis: Warfarin Code Status: Full Family Communication: Spouse Kamerin Grumbine [phone] on 5/7 Disposition Plan: Status is: Inpatient  Remains inpatient appropriate because:Inpatient level of care appropriate due to severity of illness   Dispo: The patient is from: Home              Anticipated d/c is to: SNF versus home with home health              Patient currently is not medically stable to d/c.   Difficult to place patient No  Respiratory failure resolved.  No further cardiology recommendations.  No further pulmonary recommendations.  INR therapeutic.  Patient stable for discharge at this time.  Need skilled nursing facility.  TOC aware and bed  search initiated.     Level of care: Progressive Cardiac  Consultants:   Cardiology  Procedures:   DC cardioversion  Endotracheal intubation  Antimicrobials:   None   Subjective: Patient seen and examined.  Continues to endorse weakness.  Resting in bed.  Visibly no distress.  No pain complaints.  Objective: Vitals:   07/19/20 0404 07/19/20 0500 07/19/20 0827 07/19/20 1121  BP: 138/65  135/64 122/60  Pulse: 64  63 66  Resp: 15  18 18   Temp: 98.4 F (36.9 C)  98.5 F (36.9 C) 98.2 F (36.8 C)  TempSrc: Oral     SpO2: 96%  97% 96%  Weight:  82.7 kg    Height:        Intake/Output Summary (Last 24 hours) at 07/19/2020 1230 Last data filed at 07/19/2020 0900 Gross per 24 hour  Intake 240 ml  Output --  Net 240 ml   Filed Weights   07/18/20 0438 07/18/20 0741 07/19/20 0500  Weight: 80.7 kg 83.5 kg 82.7 kg    Examination:  General exam: No acute distress.  Appears fatigued.  Hoarse voice Respiratory system: Mild scattered crackles bilaterally.  Normal work of breathing.  Room air Cardiovascular system: S1-S2.  Regular rate and rhythm.  No murmurs.  No pedal edema Gastrointestinal system: Abdomen is nondistended, soft and nontender. No organomegaly or masses felt. Normal bowel sounds heard. Central nervous system: Alert and oriented. No focal neurological deficits. Extremities: Symmetric 5 x 5 power. Skin: No rashes, lesions or ulcers Psychiatry: Judgement and insight appear impaired. Mood & affect flattened.     Data Reviewed: I have personally reviewed following labs and imaging studies  CBC: Recent Labs  Lab 07/13/20 0318 07/13/20 1004 07/15/20 0500 07/16/20 0459 07/17/20 0825 07/17/20 0832 07/18/20 0834  WBC 6.6   < > 4.8 4.5 6.9 6.9 6.0  NEUTROABS 5.0  --   --   --   --   --   --   HGB 11.8*   < > 10.4* 10.6* 11.0* 11.1* 9.9*  HCT 34.7*   < > 29.5* 31.1* 32.8* 32.9* 30.6*  MCV 89.4   < > 87.5 89.6 91.4 89.9 93.0  PLT 119*   < > 94* 121*  138* 143* 143*   < > = values in this interval not displayed.   Basic Metabolic Panel: Recent Labs  Lab 07/13/20 1702 07/14/20 0200 07/14/20 1630 07/15/20 0500 07/16/20 0459 07/17/20 0825 07/17/20 0832 07/18/20 0834 07/19/20 0414  NA  --  132*  --  138 139 139 138 133* 137  K  --  4.0   < > 3.2* 3.1* 4.6 4.5 5.2* 4.8  CL  --  100  --  106 109 108 107 103 106  CO2  --  22  --  24 22 23 23 22 24   GLUCOSE  --  451*  --  123* 66* 77 77 145* 67*  BUN  --  46*  --  46* 43* 42* 42* 37* 33*  CREATININE  --  2.85*  --  2.60* 2.42* 2.69* 2.63* 2.33* 2.31*  CALCIUM  --  7.7*  --  8.4* 7.7* 8.8* 8.6* 8.5* 9.1  MG 2.3 2.3  --  2.1 1.9 2.5*  --  2.1 2.0  PHOS 3.2  2.6  --  2.3* 3.1 3.6  --   --   --    < > = values in this interval not displayed.   GFR: Estimated Creatinine Clearance: 31.7 mL/min (A) (by C-G formula based on SCr of 2.31 mg/dL (H)). Liver Function Tests: No results for input(s): AST, ALT, ALKPHOS, BILITOT, PROT, ALBUMIN in the last 168 hours. No results for input(s): LIPASE, AMYLASE in the last 168 hours. Recent Labs  Lab 07/13/20 0842  AMMONIA <9*   Coagulation Profile: Recent Labs  Lab 07/16/20 0459 07/17/20 0825 07/18/20 0834 07/19/20 0414  INR 1.1 2.1* 3.2* 2.5*   Cardiac Enzymes: No results for input(s): CKTOTAL, CKMB, CKMBINDEX, TROPONINI in the last 168 hours. BNP (last 3 results) No results for input(s): PROBNP in the last 8760 hours. HbA1C: No results for input(s): HGBA1C in the last 72 hours. CBG: Recent Labs  Lab 07/18/20 1954 07/18/20 2334 07/19/20 0402 07/19/20 0826 07/19/20 1122  GLUCAP 179* 135* 73 122* 95   Lipid Profile: No results for input(s): CHOL, HDL, LDLCALC, TRIG, CHOLHDL, LDLDIRECT in the last 72 hours. Thyroid Function Tests: No results for input(s): TSH, T4TOTAL, FREET4, T3FREE, THYROIDAB in the last 72 hours. Anemia Panel: No results for input(s): VITAMINB12, FOLATE, FERRITIN, TIBC, IRON, RETICCTPCT in the last 72  hours. Sepsis Labs: Recent Labs  Lab 07/13/20 0318  PROCALCITON 2.95    Recent Results (from the past 240 hour(s))  Resp Panel by RT-PCR (Flu A&B, Covid) Nasopharyngeal Swab     Status: None   Collection Time: 07/10/20 10:05 PM   Specimen: Nasopharyngeal Swab; Nasopharyngeal(NP) swabs in vial transport medium  Result Value Ref Range Status   SARS Coronavirus 2 by RT PCR NEGATIVE NEGATIVE Final    Comment: (NOTE) SARS-CoV-2 target nucleic acids are NOT DETECTED.  The SARS-CoV-2 RNA is generally detectable in upper respiratory specimens during the acute phase of infection. The lowest concentration of SARS-CoV-2 viral copies this assay can detect is 138 copies/mL. A negative result does not preclude SARS-Cov-2 infection and should not be used as the sole basis for treatment or other patient management decisions. A negative result may occur with  improper specimen collection/handling, submission of specimen other than nasopharyngeal swab, presence of viral mutation(s) within the areas targeted by this assay, and inadequate number of viral copies(<138 copies/mL). A negative result must be combined with clinical observations, patient history, and epidemiological information. The expected result is Negative.  Fact Sheet for Patients:  EntrepreneurPulse.com.au  Fact Sheet for Healthcare Providers:  IncredibleEmployment.be  This test is no t yet approved or cleared by the [place] FDA and  has been authorized for detection and/or diagnosis of SARS-CoV-2 by FDA under an Emergency Use Authorization (EUA). This EUA will remain  in effect (meaning this test can be used) for the duration of the COVID-19 declaration under Section 564(b)(1) of the Act, 21 U.S.C.section 360bbb-3(b)(1), unless the authorization is terminated  or revoked sooner.       Influenza A by PCR NEGATIVE NEGATIVE Final   Influenza B by PCR NEGATIVE NEGATIVE Final     Comment: (NOTE) The Xpert Xpress SARS-CoV-2/FLU/RSV plus assay is intended as an aid in the diagnosis of influenza from Nasopharyngeal swab specimens and should not be used as a sole basis for treatment. Nasal washings and aspirates are unacceptable for Xpert Xpress SARS-CoV-2/FLU/RSV testing.  Fact Sheet for Patients: EntrepreneurPulse.com.au  Fact Sheet for Healthcare Providers: IncredibleEmployment.be  This test is not yet approved or cleared by the [place]  FDA and has been authorized for detection and/or diagnosis of SARS-CoV-2 by FDA under an Emergency Use Authorization (EUA). This EUA will remain in effect (meaning this test can be used) for the duration of the COVID-19 declaration under Section 564(b)(1) of the Act, 21 U.S.C. section 360bbb-3(b)(1), unless the authorization is terminated or revoked.  Performed at [facility], [address] [place], [place] [address]   Blood culture (single)     Status: None   Collection Time: 07/10/20 11:53 PM   Specimen: BLOOD  Result Value Ref Range Status   Specimen Description BLOOD RIGHT [facility]   Final   Special Requests   Final    BOTTLES DRAWN AEROBIC AND ANAEROBIC Blood Culture adequate volume   Culture   Final    NO GROWTH 5 DAYS Performed at [facility], [address] [place], [place] [address]    Report Status 07/16/2020 FINAL  Final  MRSA PCR Screening     Status: None   Collection Time: 07/11/20  2:00 AM   Specimen: Nasopharyngeal  Result Value Ref Range Status   MRSA by PCR NEGATIVE NEGATIVE Final    Comment:        The GeneXpert MRSA Assay (FDA approved for NASAL specimens only), is one component of a comprehensive MRSA colonization surveillance program. It is not intended to diagnose MRSA infection nor to guide or monitor treatment for MRSA infections. Performed at [facility], [address] [place], [place] [address]   Culture,  Respiratory w Gram Stain     Status: None   Collection Time: 07/12/20 10:41 AM   Specimen: Tracheal Aspirate; Respiratory  Result Value Ref Range Status   Specimen Description   Final    TRACHEAL ASPIRATE Performed at [facility], [address], [place], [place] [address]    Special Requests   Final    NONE Performed at [facility], [address] [place], [place] [address]    Gram Stain   Final    RARE WBC PRESENT, PREDOMINANTLY PMN NO ORGANISMS SEEN    Culture   Final    Normal respiratory flora-no Staph aureus or Pseudomonas seen Performed at [facility] [address], [place], [place] [address]    Report Status 07/14/2020 FINAL  Final         Radiology Studies: No results found.      Scheduled Meds: . amiodarone  400 mg Oral BID  . cycloSPORINE modified  200 mg Oral Daily   And  . cycloSPORINE modified  175 mg Oral QHS  . docusate sodium  100 mg Oral BID  . DULoxetine  30 mg Oral Daily  . feeding supplement  237 mL Oral BID BM  . insulin aspart  0-9 Units Subcutaneous Q4H  . insulin glargine  15 Units Subcutaneous Daily  . lidocaine  1 patch Transdermal Q24H  . mouth rinse  15 mL Mouth Rinse BID  . metoprolol succinate  25 mg Oral BID  . mirtazapine  15 mg Oral QHS  . multivitamin with minerals  1 tablet Oral Daily  . mycophenolate  500 mg Oral BID  . pantoprazole  40 mg Oral QAC breakfast  . polyethylene glycol  17 g Oral Daily  . QUEtiapine  25 mg Oral QHS  . rosuvastatin  5 mg Oral QPM  . sildenafil  40 mg Oral TID  . warfarin  2.5 mg Oral ONCE-1600  . Warfarin - Pharmacist Dosing Inpatient  Does not apply q1600   Continuous Infusions: . sodium chloride 10 mL/hr at 07/16/20 0500     LOS: 8 days    Time spent: 15 minutes    Sidney Ace, MD [facility] Pager [phone]  If 7PM-7AM, please contact night-coverage 07/19/2020, 12:30 PM

## 2020-07-19 NOTE — Progress Notes (Signed)
[place] for Warfarin Indication: atrial fibrillation  Patient Measurements: Height: 6' (182.9 cm) Weight: 82.7 kg (182 lb 4.8 oz) IBW/kg (Calculated) : 77.6   Labs: Recent Labs    07/16/20 0930 07/17/20 0825 07/17/20 0825 07/17/20 0832 07/18/20 0834 07/19/20 0414  HGB  --  11.0*   < > 11.1* 9.9*  --   HCT  --  32.8*  --  32.9* 30.6*  --   PLT  --  138*  --  143* 143*  --   LABPROT  --  23.3*  --   --  32.4* 26.6*  INR  --  2.1*  --   --  3.2* 2.5*  HEPARINUNFRC 0.41 0.32  --   --  >1.10*  --   CREATININE  --  2.69*   < > 2.63* 2.33* 2.31*   < > = values in this interval not displayed.    Estimated Creatinine Clearance: 31.7 mL/min (A) (by C-G formula based on SCr of 2.31 mg/dL (H)).   Medical History: Past Medical History:  Diagnosis Date  . Atrial fibrillation ([place])   . BPH (benign prostatic hyperplasia)   . Cancer ([place])    liver  . Cirrhosis ([place])   . Diabetes mellitus without complication ([place])   . GERD (gastroesophageal reflux disease)   . Hepatitis C     Assessment: 70 y/o M with medical history including cirrhosis / liver cancer s/p liver transplant in 2018, pulmonary arterial hypertension, diabetes, Afib, BPH, substance abuse who is admitted with acute hypoxic respiratory failure secondary to acute encephalopathy / possible CVA and hyperglycemic emergency / AKI. Pt not on anticoagulation prior to admission. reports of coffee ground emesis on 4/29 and has since resolved. Pharmacy has been consulted for heparin dosing and monitoring for afib and bridge to warfarin.  Hgb decreased from admission but maintaining ~11 g/dL. Thrombocytopenia improving.   Dosing by wt of 77.6 kg  DDI: amiodarone, cyclosporine LFTs: Albumin: 4.7, Tbili 1.7, BL INR 1  Heparin Date Time HL Rate/Comment 5/2 1702 0.29 Slightly < goal; 1200>>1350 un/hr 5/2 0200 0.35 Therapeutic x 1; 1350 un/hr 5/3 0826 0.32 Therapeutic x 2; 1350  un/hr 5/3 1430 0.24 Subtherapeutic; 1350>> 5/4 0203 0.36 Therapeutic x 1 5/4 1048 0.19 Subtherapeutic, spoke with nurse who stated infusion had been off for ~1.5 hours 5/5 0139 0.30 Therapeutic x 1 5/5 0930 0.41 Therapeutic x 2 5/6 0825 0.32 Therapeutic x 3 5/7 0834 >1.10   Supratherapeutic, MD discontinued  Warfarin  Date INR Dose 5/1 1.1 Baseline INR 5/4 N/A Warfarin 7.5 mg 5/5 1.1 Warfarin 7.5 mg 5/6 2.1 Warfarin 2.5 mg 5/7 3.2 Hold 5/8 2.5   Goal of Therapy:  INR 2.0-3.0 Monitor platelets by anticoagulation protocol: Yes   Plan:   INR 2.5 -  Will order Warfarin 2.5 mg x1 today.Watch  DDI (amiodarone). Likely also from low vitamin K dietary intake  Re-check INR with AM labs  Continue to monitor H&H and platelets   Nickolai Rinks A 07/19/2020 8:38 AM

## 2020-07-19 NOTE — Consult Note (Signed)
PHARMACY CONSULT NOTE  Pharmacy Consult for Electrolyte Monitoring and Replacement   Recent Labs: Potassium (mmol/L)  Date Value  07/19/2020 4.8   Magnesium (mg/dL)  Date Value  07/19/2020 2.0   Calcium (mg/dL)  Date Value  07/19/2020 9.1   Albumin (g/dL)  Date Value  07/10/2020 4.7   Phosphorus (mg/dL)  Date Value  07/17/2020 3.6   Sodium (mmol/L)  Date Value  07/19/2020 137   Assessment: Patient is a 70 y/o M with medical history including cirrhosis / liver cancer s/p liver transplant in 2018, pulmonary arterial hypertension, diabetes, Afib, BPH, substance abuse who is admitted with acute hypoxic respiratory failure secondary to acute encephalopathy / possible CVA and hyperglycemic emergency / AKI. Pharmacy has been consulted for electrolyte management.   Patient intubated 4/29 - 5/3. Now extubated on RA. On amiodarone. Renal dysfunction persistent. Has a central line. On dysphagia diet. NG tube out.  Goal of Therapy:  K 4-5.1 mmol/L Mg 2-2.4 mg/dL All other electrolytes within normal limits  Plan:  K 4.8  Mag 2.0  Scr 2.31  Na 137 --No electrolyte replacement warranted today --Follow-up electrolytes with AM labs   Hector Mueller A 07/19/2020 8:27 AM

## 2020-07-19 NOTE — Progress Notes (Signed)
Occupational Therapy Treatment Patient Details Name: Hector Mueller MRN: [id number] DOB: [birth year] Today's Date: 07/19/2020    History of present illness 70 y/o male with h/o liver cirrhosis due to hepatitis C status post liver transplant in 2018, DM, BPH, GERD, AFib and CKD who is admitted with AKI and possible CVA. MRI negative for CVA   OT comments  Pt seen for OT treatment this date to f/u re: safety with ADLs/ADL mobility. Pt demos progress with commode transfer, toileting skills, bathign and dressing during session. OT engages pt in sup to sit transition with CGA with HOB elevated. Pt able to perform STS with RW with CGA from bed and performs fxl mobility to restroom with min cues for standing posture. Pt requires MIN A for transfer to commode d/t lower surface. For bathing tasks, pt requires SETUP for UB and MIN A For standing LB bathing. Pt requires MOD A for LB dressing to don socks primarily d/t L knee pain that he reports is chronic. Pt able to tolerate fxl mobility back to chair on opposite side of room, ~30-40' from bathroom with CGA with RW for UE support. Pt left in chair with all needs met and in reach. Will continue to follow acutely. As pt continues to progress with OT goals, will continue to update d/c recommendation accordingly. At this time, will continue to recommend STR as pt is still requiring increased assist versus baseline for all aspects of transfer and most aspects of basic self care at this time.    Follow Up Recommendations  SNF    Equipment Recommendations  3 in 1 bedside commode;Tub/shower seat;Other (comment) (2ww)    Recommendations for Other Services      Precautions / Restrictions Precautions Precautions: Fall Precaution Comments: orthostatic hypotension Restrictions Weight Bearing Restrictions: No       Mobility Bed Mobility Overal bed mobility: Needs Assistance Bed Mobility: Supine to Sit     Supine to sit: Min guard;Min assist;HOB elevated      General bed mobility comments: increased time    Transfers Overall transfer level: Needs assistance Equipment used: Rolling walker (2 wheeled) Transfers: Sit to/from Stand Sit to Stand: Min guard Stand pivot transfers: Min assist       General transfer comment: increased time and assist to/from lower surfaces like toilet SPS, but only CGA for STS from chair and bed.    Balance Overall balance assessment: Needs assistance Sitting-balance support: Feet supported Sitting balance-Leahy Scale: Good Sitting balance - Comments: G static, F dynamic   Standing balance support: Bilateral upper extremity supported;During functional activity Standing balance-Leahy Scale: Fair Standing balance comment: requires UE support on RW                           ADL either performed or assessed with clinical judgement   ADL Overall ADL's : Needs assistance/impaired     Grooming: Wash/dry face;Oral care;Applying deodorant;Supervision/safety;Min guard;Standing Grooming Details (indicate cue type and reason): sink-side with RW. Upper Body Bathing: Supervision/ safety   Lower Body Bathing: Min guard;Minimal assistance;Sit to/from stand Lower Body Bathing Details (indicate cue type and reason): RW sink-side with SETUP and steadying support Upper Body Dressing : Independent;Sitting   Lower Body Dressing: Moderate assistance;Sitting/lateral leans Lower Body Dressing Details (indicate cue type and reason): to do socks Toilet Transfer: Minimal assistance;Ambulation;RW;Grab bars Toilet Transfer Details (indicate cue type and reason): RW to/from restroom, MIN A fo support with steady descent to toilet seat Toileting- Clothing Manipulation  and Hygiene: Minimal assistance;Sit to/from stand       Functional mobility during ADLs: Min guard;Minimal assistance;Rolling walker       Vision Patient Visual Report: No change from baseline     Perception     Praxis      Cognition  Arousal/Alertness: Awake/alert Behavior During Therapy: WFL for tasks assessed/performed Overall Cognitive Status: Within Functional Limits for tasks assessed                                 General Comments: A&O x4        Exercises Other Exercises Other Exercises: bathing, dressing, grooming tasks.   Shoulder Instructions       General Comments      Pertinent Vitals/ Pain       Pain Assessment: Faces Faces Pain Scale: Hurts little more Pain Location: lower back, L knee Pain Descriptors / Indicators: Aching;Discomfort;Grimacing Pain Intervention(s): Limited activity within patient's tolerance;Monitored during session;Repositioned  Home Living                                          Prior Functioning/Environment              Frequency  Min 2X/week        Progress Toward Goals  OT Goals(current goals can now be found in the care plan section)  Progress towards OT goals: Progressing toward goals  Acute Rehab OT Goals Patient Stated Goal: to get better, go home OT Goal Formulation: With patient Time For Goal Achievement: 07/28/20 Potential to Achieve Goals: Good  Plan Discharge plan remains appropriate    Co-evaluation                 AM-PAC OT "6 Clicks" Daily Activity     Outcome Measure   Help from another person eating meals?: None Help from another person taking care of personal grooming?: A Little Help from another person toileting, which includes using toliet, bedpan, or urinal?: A Little Help from another person bathing (including washing, rinsing, drying)?: A Little Help from another person to put on and taking off regular upper body clothing?: A Little Help from another person to put on and taking off regular lower body clothing?: A Lot 6 Click Score: 18    End of Session Equipment Utilized During Treatment: Gait belt;Rolling walker  OT Visit Diagnosis: Unsteadiness on feet (R26.81);Repeated falls  (R29.6);Muscle weakness (generalized) (M62.81)   Activity Tolerance Patient tolerated treatment well   Patient Left with call bell/phone within reach;in chair;with chair alarm set   Nurse Communication Mobility status;Precautions        Time: 1038-1122 OT Time Calculation (min): 44 min  Charges: OT General Charges $OT Visit: 1 Visit OT Treatments $Self Care/Home Management : 23-37 mins $Therapeutic Activity: 8-22 mins   , MS, OTR/L ascom [phone] 07/19/20, 4:58 PM   

## 2020-07-19 NOTE — TOC Progression Note (Signed)
Transition of Care [facility]) - Progression Note    Patient Details  Name: Mcgwire Dasaro MRN: [id number] Date of Birth: [birth year]  [facility]) CM/SW Contact  Izola Price, RN Phone Number: 07/19/2020, 5:16 PM  Clinical Narrative:  5/8: Condition and recommendations changing between SNF/HH. Simmie Davies RN CM         Expected Discharge Plan and Services                                                 Social Determinants of Health (SDOH) Interventions    Readmission Risk Interventions No flowsheet data found.

## 2020-07-20 DIAGNOSIS — I4891 Unspecified atrial fibrillation: Secondary | ICD-10-CM | POA: Diagnosis not present

## 2020-07-20 DIAGNOSIS — J9601 Acute respiratory failure with hypoxia: Secondary | ICD-10-CM | POA: Diagnosis not present

## 2020-07-20 LAB — PROTIME-INR
INR: 2.6 — ABNORMAL HIGH (ref 0.8–1.2)
Prothrombin Time: 27.9 seconds — ABNORMAL HIGH (ref 11.4–15.2)

## 2020-07-20 LAB — BASIC METABOLIC PANEL
Anion gap: 7 (ref 5–15)
BUN: 31 mg/dL — ABNORMAL HIGH (ref 8–23)
CO2: 23 mmol/L (ref 22–32)
Calcium: 9 mg/dL (ref 8.9–10.3)
Chloride: 105 mmol/L (ref 98–111)
Creatinine, Ser: 2.44 mg/dL — ABNORMAL HIGH (ref 0.61–1.24)
GFR, Estimated: 27 mL/min — ABNORMAL LOW (ref >60)
Glucose, Bld: 148 mg/dL — ABNORMAL HIGH (ref 70–99)
Potassium: 6 mmol/L — ABNORMAL HIGH (ref 3.5–5.1)
Sodium: 135 mmol/L (ref 135–145)

## 2020-07-20 LAB — GLUCOSE, CAPILLARY
Glucose-Capillary: 115 mg/dL — ABNORMAL HIGH (ref 70–99)
Glucose-Capillary: 128 mg/dL — ABNORMAL HIGH (ref 70–99)
Glucose-Capillary: 142 mg/dL — ABNORMAL HIGH (ref 70–99)
Glucose-Capillary: 173 mg/dL — ABNORMAL HIGH (ref 70–99)

## 2020-07-20 LAB — PHOSPHORUS: Phosphorus: 3.5 mg/dL (ref 2.5–4.6)

## 2020-07-20 LAB — MAGNESIUM: Magnesium: 1.9 mg/dL (ref 1.7–2.4)

## 2020-07-20 MED ORDER — AMIODARONE HCL 200 MG PO TABS: ORAL_TABLET | ORAL | 0 refills | Status: DC

## 2020-07-20 MED ORDER — SODIUM ZIRCONIUM CYCLOSILICATE 10 G PO PACK
10.0000 g | PACK | Freq: Once | ORAL | Status: AC
  Administered 2020-07-20: 10 g via ORAL
  Filled 2020-07-20: qty 1

## 2020-07-20 MED ORDER — WARFARIN SODIUM 2.5 MG PO TABS: 2.5000 mg | ORAL_TABLET | Freq: Every day | ORAL | 11 refills | Status: DC

## 2020-07-20 MED ORDER — METOPROLOL SUCCINATE ER 25 MG PO TB24
25.0000 mg | ORAL_TABLET | Freq: Two times a day (BID) | ORAL | 0 refills | Status: DC
Start: 2020-07-20 — End: 2021-05-03

## 2020-07-20 MED ORDER — ACETAMINOPHEN 325 MG PO TABS
650.0000 mg | ORAL_TABLET | Freq: Four times a day (QID) | ORAL | Status: DC | PRN
  Administered 2020-07-20: 650 mg via ORAL

## 2020-07-20 MED ORDER — AMIODARONE HCL 200 MG PO TABS: 200.0000 mg | ORAL_TABLET | Freq: Two times a day (BID) | ORAL | Status: DC

## 2020-07-20 NOTE — Progress Notes (Signed)
PT Cancellation Note  Patient Details Name: Lan Mcneill MRN: [id number] DOB: 01-Mar-1951   Cancelled Treatment:    Reason Eval/Treat Not Completed: Medical issues which prohibited therapy: Pt's Ka 6.0 and trending up falling outside guidelines for participation with PT services.  Will attempt to see pt at a future date/time as medically appropriate.    Linus Salmons PT, DPT 07/20/20, 10:40 AM

## 2020-07-20 NOTE — TOC Transition Note (Signed)
Transition of Care [facility]) - CM/SW Discharge Note   Patient Details  Name: Ravi Tuccillo MRN: [id number] Date of Birth: 12/07/1950  Transition of Care [facility]) CM/SW Contact:  Kerin Salen, RN Phone Number: 07/20/2020, 11:51 AM   Clinical Narrative:  To discharge home today with [place]  who will also do INR's blood draws per Attending orders.    Final next level of care: [place] Barriers to Discharge: Barriers Resolved   Patient Goals and CMS Choice Patient states their goals for this hospitalization and ongoing recovery are:: To return home.   Choice offered to / list presented to : Patient  Discharge Placement                Patient to be transferred to facility by: wife, left phone message Name of family member notified: Maricus Tanzi Patient and family notified of of transfer: 07/20/20  Discharge Plan and Services                DME Arranged: N/A DME Agency: NA       HH Arranged: OT,PT,RN,Nurse's Aide [place] Agency: [place] ([place]) Date HH Agency Contacted: 07/20/20 Time [place]: [place] Representative spoke with at [place]: [place] ([place]) Interventions     Readmission Risk Interventions No flowsheet data found.

## 2020-07-20 NOTE — NC FL2 (Signed)
[place] LEVEL OF CARE SCREENING TOOL     IDENTIFICATION  Patient Name: Hector Mueller Birthdate: [birth year] Sex: male Admission Date (Current Location): 07/10/2020  [place] and [place] Number:  Engineering geologist and Address:  [facility], [address] [place], [place] [address]      Provider Number: [id number]  Attending Physician Name and Address:  Sidney Ace, MD  Relative Name and Phone Number:  Mujtaba Bollig, spouse [phone]    Current Level of Care: Hospital Recommended Level of Care: [place] Prior Approval Number:    Date Approved/Denied:   PASRR Number: [id number]  Discharge Plan: SNF    Current Diagnoses: Patient Active Problem List   Diagnosis Date Noted  . HFrEF (heart failure with reduced ejection fraction) ([place])   . Atrial fibrillation ([place])   . Acute respiratory failure ([place]) 07/11/2020  . Hip fracture ([place]) 11/27/2017    Orientation RESPIRATION BLADDER Height & Weight     Self,Time,Situation,Place  Normal External catheter Weight: 82.5 kg Height:  6' (182.9 cm)  BEHAVIORAL SYMPTOMS/MOOD NEUROLOGICAL BOWEL NUTRITION STATUS      Continent Diet  AMBULATORY STATUS COMMUNICATION OF NEEDS Skin   Limited Assist   Other (Comment) (Ecchymosis left and right hand)                       Personal Care Assistance Level of Assistance  Bathing,Feeding,Dressing Bathing Assistance: Limited assistance Feeding assistance: Independent Dressing Assistance: Limited assistance     Functional Limitations Info  Sight,Hearing,Speech Sight Info: Adequate Hearing Info: Adequate Speech Info: Adequate    SPECIAL CARE FACTORS FREQUENCY  PT (By licensed PT),OT (By licensed OT)     PT Frequency: 5x week OT Frequency: 5x week            Contractures Contractures Info: Not present    Additional Factors Info  Code Status,Allergies Code Status Info: Full Allergies Info: None            Current Medications (07/20/2020):  This is the current hospital active medication list Current Facility-Administered Medications  Medication Dose Route Frequency Provider Last Rate Last Admin  . 0.9 %  sodium chloride infusion  250 mL Intravenous Continuous Rust-Chester, Britton L, NP 10 mL/hr at 07/16/20 0500 Infusion Verify at 07/16/20 0500  . albuterol (PROVENTIL) (2.5 MG/3ML) 0.083% nebulizer solution 2.5 mg  2.5 mg Nebulization Q4H PRN Rust-Chester, Britton L, NP      . amiodarone (PACERONE) tablet 400 mg  400 mg Oral BID Kate Sable, MD   400 mg at 07/19/20 2039  . cycloSPORINE modified (NEORAL) capsule 200 mg  200 mg Oral Daily Priscella Mann, Sudheer B, MD   200 mg at 07/19/20 0902   And  . cycloSPORINE modified (GENGRAF) capsule 175 mg  175 mg Oral QHS Ralene Muskrat B, MD   175 mg at 07/19/20 2037  . docusate sodium (COLACE) capsule 100 mg  100 mg Oral BID PRN Rust-Chester, Britton L, NP      . docusate sodium (COLACE) capsule 100 mg  100 mg Oral BID Benita Gutter, RPH   100 mg at 07/19/20 2039  . DULoxetine (CYMBALTA) DR capsule 30 mg  30 mg Oral Daily Ralene Muskrat B, MD   30 mg at 07/19/20 0903  . feeding supplement (ENSURE ENLIVE / ENSURE PLUS) liquid 237 mL  237 mL Oral BID BM Ralene Muskrat B, MD   237 mL at 07/19/20 0904  . insulin aspart (  novoLOG) injection 0-9 Units  0-9 Units Subcutaneous Q4H Ralene Muskrat B, MD   1 Units at 07/19/20 2036  . insulin glargine (LANTUS) injection 15 Units  15 Units Subcutaneous Daily Ralene Muskrat B, MD   15 Units at 07/19/20 0902  . lidocaine (LIDODERM) 5 % 1 patch  1 patch Transdermal Q24H Ralene Muskrat B, MD   1 patch at 07/19/20 0901  . MEDLINE mouth rinse  15 mL Mouth Rinse BID Jacky Kindle, MD   15 mL at 07/19/20 0904  . methocarbamol (ROBAXIN) tablet 750 mg  750 mg Oral Q8H PRN Ralene Muskrat B, MD   750 mg at 07/19/20 0946  . metoprolol succinate (TOPROL-XL) 24 hr tablet 25 mg  25 mg Oral BID Minna Merritts, MD   25 mg at 07/19/20 2038  . mirtazapine (REMERON) tablet 15 mg  15 mg Oral QHS Ralene Muskrat B, MD   15 mg at 07/19/20 2040  . multivitamin with minerals tablet 1 tablet  1 tablet Oral Daily Ralene Muskrat B, MD   1 tablet at 07/19/20 0903  . mycophenolate (CELLCEPT) capsule 500 mg  500 mg Oral BID Ralene Muskrat B, MD   500 mg at 07/19/20 2038  . ondansetron (ZOFRAN) 4 MG/5ML solution 4 mg  4 mg Oral Q8H PRN Sreenath, Sudheer B, MD      . ondansetron (ZOFRAN) injection 4 mg  4 mg Intravenous Q6H PRN Ralene Muskrat B, MD   4 mg at 07/18/20 1017  . pantoprazole (PROTONIX) EC tablet 40 mg  40 mg Oral QAC breakfast Ralene Muskrat B, MD   40 mg at 07/19/20 0904  . polyethylene glycol (MIRALAX / GLYCOLAX) packet 17 g  17 g Oral Daily PRN Rust-Chester, Britton L, NP      . polyethylene glycol (MIRALAX / GLYCOLAX) packet 17 g  17 g Oral Daily Benita Gutter, RPH   17 g at 07/19/20 0901  . polyvinyl alcohol (LIQUIFILM TEARS) 1.4 % ophthalmic solution 1 drop  1 drop Both Eyes PRN Dallie Piles, RPH   1 drop at 07/15/20 5102  . QUEtiapine (SEROQUEL) tablet 25 mg  25 mg Oral QHS Ralene Muskrat B, MD   25 mg at 07/19/20 2039  . rosuvastatin (CRESTOR) tablet 5 mg  5 mg Oral QPM Sreenath, Sudheer B, MD   5 mg at 07/19/20 1714  . sildenafil (REVATIO) tablet 40 mg  40 mg Oral TID Ralene Muskrat B, MD   40 mg at 07/19/20 2038  . sodium zirconium cyclosilicate (LOKELMA) packet 10 g  10 g Oral Once Ralene Muskrat B, MD      . traZODone (DESYREL) tablet 50 mg  50 mg Oral QHS PRN Ralene Muskrat B, MD   50 mg at 07/19/20 2038  . Warfarin - Pharmacist Dosing Inpatient   Does not apply [id number] Sherilyn Banker, [facility]   Given at 07/18/20 1725     Discharge Medications: Please see discharge summary for a list of discharge medications.  Relevant Imaging Results:  Relevant Lab Results:   Additional Information SS# [id number]  Kerin Salen, RN

## 2020-07-20 NOTE — Care Management Important Message (Signed)
Important Message  Patient Details  Name: Hector Mueller MRN: [id number] Date of Birth: [birth year]   Medicare Important Message Given:  Yes     [name] 07/20/2020, 12:02 PM

## 2020-07-20 NOTE — Discharge Summary (Addendum)
Physician Discharge Summary  Hector Mueller EQA:[id number] DOB: [birth year] DOA: 07/10/2020  PCP: [name], MD  Admit date: 07/10/2020 Discharge date: 07/20/2020  Admitted From: Home Disposition: Home with home health  Recommendations for Outpatient Follow-up:  1. Follow up with PCP in 1-2 weeks 2. Follow-up with pulmonology for pulmonary hypertension evaluation and management 3. Follow-up with cardiology at [facility] for A. fib and heart failure management  Home Health: Yes Equipment/Devices: No Discharge Condition: Stable CODE STATUS: Full Diet recommendation: Heart healthy Brief/Interim Summary: 70 year old male who was brought to the [facility] ED due to altered mental status. Per ED documentation the patient was reportedly in his usual state of health last night on 07/09/20 until about 11:30 PM. This morning the patient was noted to have difficulty walking, but was talking and interacting normally with family. However today at noon he was found to be unable to stand or move purposefully as well as being unable to speak. Extubated successfully on 5/3.  Mental status returning to baseline on 5/4.  Patient status post DC cardioversion on 5/2 with successful restoration of sinus rhythm.  Seen by cardiology and converted to p.o. amiodarone.  Seen by SLP and diet advanced as well. 5/5: Per nursing patient had some hallucinations and altered mentation overnight.  Endorsed poor sleep.  Was appropriate with me this morning.  Does endorse some lethargy. 5/6: No acute status changes.  Interestingly INR went from 1.1-2.1 in 24 hours.  Suspect impact from amiodarone. 5/7: INR therapeutic.  Heparin drip stopped 5/8: Stable for discharge home.  Will decrease dose of amiodarone to 200 mg twice daily x7 days, followed by 200 mg daily indefinitely.  1 month supply provided.  Warfarin dose decreased to 2.5 mg daily.  Patient will need INR checks.  Adapt to coordinate.  See below for outpatient INR check  scheduled  Discharge Diagnoses:  Active Problems:   Acute respiratory failure (HCC)   Atrial fibrillation (HCC)   HFrEF (heart failure with reduced ejection fraction) (HCC)  Acute Hypoxic Respiratory Failure secondary to acute encephalopathy in the setting of HHS Acute stroke was ruled out Acute encephalopathy, multifactorial. metabolic/ suspected CVA Poorly controlled diabetes type 2 presented with [facility] now again with hyperglycemia CT imaging survey negative for acute stroke/LVO.  MRI not possible due to pacer.  Mental status back at baseline.  Tolerated extubation well.  Suspected etiology of encephalopathy and airway compromise due to HHS. Plan: Can resume home diabetic regimen at time of discharge.  Will need close outpatient monitoring for titration and monitoring of diabetes.  Paroxysmal A. fibstatus post cardioversion Status post DC cardioversion on 5/2.  Tolerated well.  Seen by cardiology this morning.  Transition to p.o. amiodarone. 5/6 INR 2.1 5/7 INR 3.2 5/8 INR 2.5 5/9: INR 2.6 Discharge plan: Continue Coumadin 2.5 mg daily Continue amiodarone 200 mg twice daily x7 days, followed by 200 mg daily indefinitely INR check: Every 3 days times first 2 weeks, followed by weekly if stable  Acute systolic congestive heart failure Echocardiogram was done which showed EF 40 to 45% He will need to be started on beta-blocker/ACE once blood pressure improves Plan: No further recommendations from cardiology standpoint Further work-up as outpatient Patient will follow up with [facility] cardiologist  Acute Kidney Injury on CKD Baseline Cr: 1.6, Cr on admission: 3.35, likely prerenal Suspect prerenal azotemia in the setting of respiratory compromise.  Creatinine improving. 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 No more coffee-ground emesis H&H remained  stable Continuecyclosporine and mycophenolate at home Follow cyclosporine level Monitor for signs and symptoms of bleeding Transfuse if hemoglobin less than 7  Pulmonary arterial hypertension Who class I Can resume hold sildenafil at time of discharge Follow-up outpatient pulmonology Patient was seen by [facility] while hospitalized Follow-up with [facility] pulmonology  Discharge Instructions  Discharge Instructions    Diet - low sodium heart healthy   Complete by: As directed    Increase activity slowly   Complete by: As directed      Allergies as of 07/20/2020   No Known Allergies     Medication List    STOP taking these medications   aspirin 81 MG chewable tablet   bisacodyl 5 MG EC tablet Commonly known as: DULCOLAX   calcium carbonate 500 MG chewable tablet Commonly known as: TUMS - dosed in mg elemental calcium   chlorthalidone 25 MG tablet Commonly known as: HYGROTON   docusate sodium 100 MG capsule Commonly known as: COLACE   ferrous sulfate 325 (65 FE) MG tablet   HYDROcodone-acetaminophen 5-325 MG tablet Commonly known as: NORCO/VICODIN   multivitamin tablet   senna 8.6 MG Tabs tablet Commonly known as: SENOKOT   spironolactone 50 MG tablet Commonly known as: ALDACTONE     TAKE these medications   alendronate 70 MG tablet Commonly known as: FOSAMAX Take 70 mg by mouth once a week. Take with a full glass of water on an empty stomach.   amiodarone 200 MG tablet Commonly known as: PACERONE Take 1 tablet (200 mg total) by mouth 2 (two) times daily for 7 days, THEN 1 tablet (200 mg total) daily. Start taking on: Jul 21, 2020   Basaglar KwikPen 100 UNIT/ML Inject 12 Units into the skin daily as needed.   COGNEX PO Take 2 capsules by mouth daily at 6 (six) AM. With a glass of water   cycloSPORINE modified 100 MG capsule Commonly known as: NEORAL Take 200 mg by mouth daily.   cycloSPORINE modified 25 MG capsule Commonly known as:  NEORAL Take 175 mg by mouth every evening.   DULoxetine 30 MG capsule Commonly known as: CYMBALTA Take 30 mg by mouth daily.   magnesium (amino acid chelate) 133 MG tablet Take 2 tablets by mouth 2 (two) times daily.   melatonin 3 MG Tabs tablet Take by mouth.   metoprolol succinate 25 MG 24 hr tablet Commonly known as: TOPROL-XL Take 1 tablet (25 mg total) by mouth 2 (two) times daily.   mirtazapine 15 MG tablet Commonly known as: REMERON Take 15 mg by mouth at bedtime.   mycophenolate 250 MG capsule Commonly known as: CELLCEPT Take 500 mg by mouth in the morning and at bedtime.   omeprazole 20 MG capsule Commonly known as: PRILOSEC Take 20 mg by mouth daily.   ondansetron 4 MG tablet Commonly known as: ZOFRAN Take 1 tablet (4 mg total) by mouth every 6 (six) hours as needed for nausea.   Ozempic (0.25 or 0.5 MG/DOSE) 2 MG/1.5ML Sopn Generic drug: Semaglutide(0.25 or 0.5MG /DOS) Inject 0.5 mg into the skin once a week.   Ozempic (1 MG/DOSE) 4 MG/3ML Sopn Generic drug: Semaglutide (1 MG/DOSE) Inject 1 mg into the skin once a week.   rosuvastatin 5 MG tablet Commonly known as: CRESTOR Take 5 mg by mouth daily.   sildenafil 20 MG tablet Commonly known as: REVATIO Take 40 mg by mouth 3 (three) times daily.   sodium bicarbonate  650 MG tablet Take 650 mg by mouth in the morning and at bedtime.   Vitamin D3 125 MCG (5000 UT) Caps Take 5,000 Units by mouth daily.   warfarin 2.5 MG tablet Commonly known as: Coumadin Take 1 tablet (2.5 mg total) by mouth daily.            Durable Medical Equipment  (From admission, onward)         Start     Ordered   07/17/20 1350  For home use only DME Walker  Once       Question:  Patient needs a walker to treat with the following condition  Answer:  Weakness   07/17/20 1349          Follow-up Information    Tama High III, MD. Schedule an appointment as soon as possible for a visit in 1 week(s).   Specialty:  Internal Medicine Contact information: [address] [place] [id number] [phone]        Ottie Glazier, MD. Schedule an appointment as soon as possible for a visit in 1 week(s).   Specialty: Pulmonary Disease Contact information: [place] [address] [phone]        [facility] cardiologist. Schedule an appointment as soon as possible for a visit in 1 week(s).   Why: Follow-up with your established cardiologist in [facility] as directed             No Known Allergies  Consultations:  Cardiology  Pulmonology   Procedures/Studies: CT ANGIO HEAD NECK W WO CM  Result Date: 07/10/2020 CLINICAL DATA:  Initial evaluation for acute neuro deficit, stroke suspected. Unresponsive. EXAM: CT ANGIOGRAPHY HEAD AND NECK CT PERFUSION BRAIN TECHNIQUE: Multidetector CT imaging of the head and neck was performed using the standard protocol during bolus administration of intravenous contrast. Multiplanar CT image reconstructions and MIPs were obtained to evaluate the vascular anatomy. Carotid stenosis measurements (when applicable) are obtained utilizing NASCET criteria, using the distal internal carotid diameter as the denominator. Multiphase CT imaging of the brain was performed following IV bolus contrast injection. Subsequent parametric perfusion maps were calculated using RAPID software. CONTRAST:  17mL OMNIPAQUE IOHEXOL 350 MG/ML SOLN COMPARISON:  Prior head CT from earlier the same day. FINDINGS: CTA NECK FINDINGS Aortic arch: Visualized aortic arch normal in caliber with normal 3 vessel morphology. No hemodynamically significant stenosis seen about the origin the great vessels. Mild atheromatous plaque within the arch itself. Right carotid system: Right common carotid artery patent from its origin to the bifurcation without stenosis. Mild eccentric plaque at the right carotid bulb without significant stenosis. Right ICA widely  patent distally without stenosis, dissection or occlusion. Left carotid system: Left CCA patent from its origin to the bifurcation without stenosis. Minimal atheromatous change about the left carotid bulb without significant stenosis. Left ICA patent distally without stenosis, dissection or occlusion. Vertebral arteries: Both vertebral arteries arise from subclavian arteries. No proximal subclavian artery stenosis. Vertebral arteries widely patent within the neck without stenosis, dissection or occlusion. Skeleton: No visible acute osseous finding. No discrete or worrisome osseous lesions. Other neck: No other visible soft tissue abnormality within the neck. No mass or adenopathy. 5 mm right thyroid nodule noted, felt to be of doubtful significance given size and patient age, no follow-up imaging recommended (ref: J Am Coll Radiol. 2015 Feb;12(2): 143-50).Endotracheal and enteric tubes in place. Upper chest: Esophagus is collapsed about the enteric tube. There is question of  mild circumferential esophageal wall thickening/edema, which could reflect changes of acute esophagitis and/or reflux disease. Visualized upper chest demonstrates no other acute finding. Review of the MIP images confirms the above findings CTA HEAD FINDINGS Anterior circulation: Mild scattered plaque within the carotid siphons without hemodynamically significant stenosis. A1 segments patent bilaterally. Normal anterior communicating artery complex. Anterior cerebral arteries patent to their distal aspects without stenosis. No M1 stenosis or occlusion. Normal MCA bifurcations. Distal MCA branches well perfused and symmetric. Posterior circulation: Both V4 segments patent to the vertebrobasilar junction without stenosis. Right vertebral artery dominant. Left PICA origin patent and normal. Right PICA not definitely seen. Basilar patent to its distal aspect without stenosis. Superior cerebellar arteries patent bilaterally. Both PCAs primarily  supplied via the basilar. Left PCA widely patent to its distal aspect. There or focal severe distal right P3 stenoses (series 9, image 144). Attenuated but grossly patent flow seen distally within the right PCA branches. Venous sinuses: Grossly patent allowing for timing the contrast bolus. Anatomic variants: None significant.  No aneurysm. Review of the MIP images confirms the above findings CT Brain Perfusion Findings: ASPECTS: 10. CBF (<30%) Volume: 41mL Perfusion (Tmax>6.0s) volume: 26mL Mismatch Volume: 53mL Infarction Location:Negative CT perfusion with no evidence for acute ischemia or other perfusion abnormality. IMPRESSION: 1. Negative CTA for emergent large vessel occlusion. 2. Negative CT perfusion with no evidence for acute ischemia or other perfusion abnormality. 3. Severe distal right P3 stenoses. 4. Otherwise wide patency of the major arterial vasculature of the head and neck. No other hemodynamically significant or correctable stenosis. 5. Question diffuse circumferential esophageal wall thickening, which could reflect changes of acute esophagitis and/or reflux disease. Electronically Signed   By: Jeannine Boga M.D.   On: 07/10/2020 23:56   DG Chest 1 View  Result Date: 07/12/2020 CLINICAL DATA:  Central line placement. EXAM: CHEST  1 VIEW COMPARISON:  07/10/2020 FINDINGS: 0912 hours. Endotracheal tube tip is 3.9 cm above the base of the carina. NG tube tip is in the stomach. Mild asymmetric elevation left hemidiaphragm with left base atelectasis. Right IJ central line is new in the interval with tip overlying the mid to distal SVC. No pneumothorax. No pleural effusion. IMPRESSION: New right IJ central line tip overlies the mid to distal SVC. No pneumothorax. New atelectasis left base with asymmetric elevation left hemidiaphragm. Electronically Signed   By: Misty Stanley M.D.   On: 07/12/2020 09:24   CT HEAD WO CONTRAST  Result Date: 07/13/2020 CLINICAL DATA:  Follow-up stroke EXAM: CT HEAD  WITHOUT CONTRAST TECHNIQUE: Contiguous axial images were obtained from the base of the skull through the vertex without intravenous contrast. COMPARISON:  CT studies 07/10/2020 FINDINGS: Brain: Age related brain volume loss. No focal abnormality affects the brainstem or cerebellum. Cerebral hemispheres show chronic small-vessel ischemic changes of the white matter. 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 No sign of acute infarction, mass lesion, hemorrhage, hydrocephalus or extra-axial collection. Vascular: There is atherosclerotic calcification of the major vessels at the base of the brain. Skull: Negative Sinuses/Orbits: Scattered mild inflammatory changes of the paranasal sinuses. No advanced finding. Orbits negative. Other: None IMPRESSION: No acute finding by CT. Age related atrophy. Chronic small-vessel ischemic changes of the white matter. Electronically Signed   By: Nelson Chimes M.D.   On: 07/13/2020 15:33   CT CEREBRAL PERFUSION W CONTRAST  Result Date: 07/10/2020 CLINICAL DATA:  Initial evaluation for acute neuro deficit, stroke suspected. Unresponsive. EXAM: CT ANGIOGRAPHY HEAD AND NECK CT PERFUSION  BRAIN TECHNIQUE: Multidetector CT imaging of the head and neck was performed using the standard protocol during bolus administration of intravenous contrast. Multiplanar CT image reconstructions and MIPs were obtained to evaluate the vascular anatomy. Carotid stenosis measurements (when applicable) are obtained utilizing NASCET criteria, using the distal internal carotid diameter as the denominator. Multiphase CT imaging of the brain was performed following IV bolus contrast injection. Subsequent parametric perfusion maps were calculated using RAPID software. CONTRAST:  166mL OMNIPAQUE IOHEXOL 350 MG/ML SOLN COMPARISON:  Prior head CT from earlier the same day. FINDINGS: CTA NECK FINDINGS Aortic arch: Visualized aortic arch normal in caliber with normal 3 vessel morphology. No hemodynamically  significant stenosis seen about the origin the great vessels. Mild atheromatous plaque within the arch itself. Right carotid system: Right common carotid artery patent from its origin to the bifurcation without stenosis. Mild eccentric plaque at the right carotid bulb without significant stenosis. Right ICA widely patent distally without stenosis, dissection or occlusion. Left carotid system: Left CCA patent from its origin to the bifurcation without stenosis. Minimal atheromatous change about the left carotid bulb without significant stenosis. Left ICA patent distally without stenosis, dissection or occlusion. Vertebral arteries: Both vertebral arteries arise from subclavian arteries. No proximal subclavian artery stenosis. Vertebral arteries widely patent within the neck without stenosis, dissection or occlusion. Skeleton: No visible acute osseous finding. No discrete or worrisome osseous lesions. Other neck: No other visible soft tissue abnormality within the neck. No mass or adenopathy. 5 mm right thyroid nodule noted, felt to be of doubtful significance given size and patient age, no follow-up imaging recommended (ref: J Am Coll Radiol. 2015 Feb;12(2): 143-50).Endotracheal and enteric tubes in place. Upper chest: Esophagus is collapsed about the enteric tube. There is question of mild circumferential esophageal wall thickening/edema, which could reflect changes of acute esophagitis and/or reflux disease. Visualized upper chest demonstrates no other acute finding. Review of the MIP images confirms the above findings CTA HEAD FINDINGS Anterior circulation: Mild scattered plaque within the carotid siphons without hemodynamically significant stenosis. A1 segments patent bilaterally. Normal anterior communicating artery complex. Anterior cerebral arteries patent to their distal aspects without stenosis. No M1 stenosis or occlusion. Normal MCA bifurcations. Distal MCA branches well perfused and symmetric. Posterior  circulation: Both V4 segments patent to the vertebrobasilar junction without stenosis. Right vertebral artery dominant. Left PICA origin patent and normal. Right PICA not definitely seen. Basilar patent to its distal aspect without stenosis. Superior cerebellar arteries patent bilaterally. Both PCAs primarily supplied via the basilar. Left PCA widely patent to its distal aspect. There or focal severe distal right P3 stenoses (series 9, image 144). Attenuated but grossly patent flow seen distally within the right PCA branches. Venous sinuses: Grossly patent allowing for timing the contrast bolus. Anatomic variants: None significant.  No aneurysm. Review of the MIP images confirms the above findings CT Brain Perfusion Findings: ASPECTS: 10. CBF (<30%) Volume: 72mL Perfusion (Tmax>6.0s) volume: 55mL Mismatch Volume: 6mL Infarction Location:Negative CT perfusion with no evidence for acute ischemia or other perfusion abnormality. IMPRESSION: 1. Negative CTA for emergent large vessel occlusion. 2. Negative CT perfusion with no evidence for acute ischemia or other perfusion abnormality. 3. Severe distal right P3 stenoses. 4. Otherwise wide patency of the major arterial vasculature of the head and neck. No other hemodynamically significant or correctable stenosis. 5. Question diffuse circumferential esophageal wall thickening, which could reflect changes of acute esophagitis and/or reflux disease. Electronically Signed   By: Jeannine Boga M.D.   On: 07/10/2020  23:56   DG Chest Port 1 View  Result Date: 07/15/2020 CLINICAL DATA:  Acute respiratory failure with hypoxia EXAM: PORTABLE CHEST 1 VIEW COMPARISON:  Radiograph 07/12/2020 FINDINGS: Right IJ approach central venous catheter tip terminates at the mid to lower SVC. Interval removal of previously seen transesophageal and endotracheal tubes. Implantable loop recorder projects over the left chest wall. Thoracolumbar fusion hardware is again seen, incompletely  assessed. Telemetry leads and external support devices overlie the chest wall. Increasingly coalescent opacities present in the mid to lower lungs. No pneumothorax. No visible effusion. Stable cardiomediastinal contours with a calcified aorta. No acute osseous or soft tissue abnormality. Degenerative changes are present in the imaged spine and shoulders. IMPRESSION: Increasingly patchy and coalescent opacities in the mid to lower lungs. Could reflect developing airspace disease, worsening atelectasis or edema. Electronically Signed   By: Lovena Le M.D.   On: 07/15/2020 03:37   DG Chest Portable 1 View  Result Date: 07/10/2020 CLINICAL DATA:  Status post intubation. Endotracheal tube and orogastric tube. EXAM: PORTABLE CHEST 1 VIEW COMPARISON:  Chest x-ray 11/27/2017 FINDINGS: Endotracheal tube with tip terminating 3.5cm above the carina. Enteric tube coursing below the hemidiaphragm with tip and side port overlying the expected region of the gastric lumen. The heart size and mediastinal contours are unchanged. Aortic arch calcification. Or list device overlies the mediastinum. No focal consolidation. No pulmonary edema. No pleural effusion. No pneumothorax. No acute osseous abnormality. Multilevel degenerative changes of the spine. Partially visualized thoracolumbar surgical hardware. Degenerative changes of the shoulders. IMPRESSION: 1. No active disease. 2. Endotracheal tube and enteric tube in good position. Electronically Signed   By: Iven Finn M.D.   On: 07/10/2020 23:36   ECHOCARDIOGRAM COMPLETE  Result Date: 07/13/2020    ECHOCARDIOGRAM REPORT   Patient Name:   Hector Mueller Date of Exam: 07/13/2020 Medical Rec #:  [id number]      Height:       72.0 in Accession #:    [id number]     Weight:       180.6 lb Date of Birth:  1950/09/06       BSA:          2.040 m Patient Age:    29 years       BP:           90/52 mmHg Patient Gender: M              HR:           31 bpm. Exam Location:  [facility]  Procedure: 2D Echo, Cardiac Doppler and Color Doppler Indications:     Atrial Fibrillation I48.91  History:         Patient has no prior history of Echocardiogram examinations.                  Arrythmias:Atrial Fibrillation; Risk Factors:Diabetes.  Sonographer:     Sherrie Sport RDCS (AE) Referring Phys:  [id number] CHRISTOPHER END Diagnosing Phys: Kate Sable MD  Sonographer Comments: Echo performed with patient supine and on artificial respirator. Image acquisition challenging due to respiratory motion. IMPRESSIONS  1. Left ventricular ejection fraction, by estimation, is 40 to 45%. The left ventricle has mild to moderately decreased function. The left ventricle demonstrates global hypokinesis. Left ventricular diastolic parameters are consistent with Grade I diastolic dysfunction (impaired relaxation).  2. Right ventricular systolic function is normal. The right ventricular size is normal.  3. The mitral valve is grossly normal. No evidence of mitral  valve regurgitation.  4. The aortic valve was not well visualized. Aortic valve regurgitation is not visualized.  5. Pulmonic valve regurgitation not assessed. FINDINGS  Left Ventricle: Left ventricular ejection fraction, by estimation, is 40 to 45%. The left ventricle has mild to moderately decreased function. The left ventricle demonstrates global hypokinesis. The left ventricular internal cavity size was normal in size. There is no left ventricular hypertrophy. Left ventricular diastolic parameters are consistent with Grade I diastolic dysfunction (impaired relaxation). Right Ventricle: The right ventricular size is normal. No increase in right ventricular wall thickness. Right ventricular systolic function is normal. Left Atrium: Left atrial size was normal in size. Right Atrium: Right atrial size was normal in size. Pericardium: There is no evidence of pericardial effusion. Mitral Valve: The mitral valve is grossly normal. No evidence of mitral valve  regurgitation. Tricuspid Valve: The tricuspid valve is grossly normal. Tricuspid valve regurgitation is not demonstrated. Aortic Valve: The aortic valve was not well visualized. Aortic valve regurgitation is not visualized. Aortic valve mean gradient measures 3.0 mmHg. Aortic valve peak gradient measures 5.2 mmHg. Aortic valve area, by VTI measures 2.29 cm. Pulmonic Valve: The pulmonic valve was not well visualized. Pulmonic valve regurgitation not assessed. Aorta: The aortic root was not well visualized. IAS/Shunts: No atrial level shunt detected by color flow Doppler.  LEFT VENTRICLE PLAX 2D LVIDd:         3.32 cm  Diastology LVIDs:         2.44 cm  LV e' medial:    5.33 cm/s LV PW:         1.08 cm  LV E/e' medial:  11.7 LV IVS:        0.97 cm  LV e' lateral:   6.96 cm/s LVOT diam:     2.00 cm  LV E/e' lateral: 9.0 LV SV:         44 LV SV Index:   21 LVOT Area:     3.14 cm  RIGHT VENTRICLE RV S prime:     8.81 cm/s TAPSE (M-mode): 2.4 cm LEFT ATRIUM             Index       RIGHT ATRIUM           Index LA diam:        3.60 cm 1.76 cm/m  RA Area:     12.30 cm LA Vol (A2C):   23.1 ml 11.32 ml/m RA Volume:   28.50 ml  13.97 ml/m LA Vol (A4C):   28.4 ml 13.92 ml/m LA Biplane Vol: 27.6 ml 13.53 ml/m  AORTIC VALVE AV Area (Vmax):    2.27 cm AV Area (Vmean):   2.04 cm AV Area (VTI):     2.29 cm AV Vmax:           114.00 cm/s AV Vmean:          78.250 cm/s AV VTI:            0.191 m AV Peak Grad:      5.2 mmHg AV Mean Grad:      3.0 mmHg LVOT Vmax:         82.20 cm/s LVOT Vmean:        50.800 cm/s LVOT VTI:          0.139 m LVOT/AV VTI ratio: 0.73 MITRAL VALVE               TRICUSPID VALVE MV Area (PHT): 3.08 cm  TR Peak grad:   22.1 mmHg MV Decel Time: 246 msec    TR Vmax:        235.00 cm/s MV E velocity: 62.35 cm/s MV A velocity: 90.40 cm/s  SHUNTS MV E/A ratio:  0.69        Systemic VTI:  0.14 m                            Systemic Diam: 2.00 cm Kate Sable MD Electronically signed by Kate Sable MD Signature Date/Time: 07/13/2020/1:36:20 PM    Final    [place] ASCITES (ABDOMEN LIMITED)  Result Date: 07/14/2020 CLINICAL DATA:  Abdominal distension EXAM: LIMITED ABDOMEN ULTRASOUND FOR ASCITES TECHNIQUE: Limited ultrasound survey for ascites was performed in all four abdominal quadrants. COMPARISON:  06/18/2020 FINDINGS: 4 quadrant ultrasound was performed to assess for ascites. There is no ascites within the abdomen. IMPRESSION: 1. No evidence of ascites. Electronically Signed   By: Randa Ngo M.D.   On: 07/14/2020 20:38   CT HEAD CODE STROKE WO CONTRAST  Result Date: 07/10/2020 CLINICAL DATA:  Code stroke. Initial evaluation for acute unresponsiveness. EXAM: CT HEAD WITHOUT CONTRAST TECHNIQUE: Contiguous axial images were obtained from the base of the skull through the vertex without intravenous contrast. COMPARISON:  Prior CT from 11/27/2017. 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 No acute intracranial hemorrhage. No acute large vessel territory infarct. No mass lesion, midline shift or mass effect. No hydrocephalus or extra-axial fluid collection. Vascular: No hyperdense vessel. Scattered vascular calcifications noted within the carotid siphons. Skull: Scalp soft tissues and calvarium within normal limits. Sinuses/Orbits: Globes orbital soft tissues demonstrate no acute finding. Mild mucosal thickening within the ethmoidal air cells. Paranasal sinuses are otherwise clear. No mastoid effusion. Other: None. ASPECTS [facility] Stroke Program Early CT Score) - Ganglionic level infarction (caudate, lentiform nuclei, internal capsule, insula, M1-M3 cortex): 7 - Supraganglionic infarction (M4-M6 cortex): 3 Total score (0-10 with 10 being normal): 10 IMPRESSION: 1. No acute intracranial abnormality. 2. ASPECTS is 10. 3. Age-related cerebral atrophy with chronic  microvascular ischemic disease. Critical Value/emergent results were called by telephone at the time of interpretation on 07/10/2020 at 9:30 pm to provider PHILLIP STAFFORD , who verbally acknowledged these results. Electronically Signed   By: Jeannine Boga M.D.   On: 07/10/2020 21:33    (Echo, Carotid, EGD, Colonoscopy, ERCP)    Subjective: Patient seen and examined the day of discharge.  I gave him the option of going home with home health versus looking to skilled nursing facility.  He elected to go home with home health.  Case management involved and home health services have been ordered.  Patient will discharge home.  Discharge Exam: Vitals:   07/20/20 0735 07/20/20 1122  BP: (!) 142/55 (!) 131/59  Pulse: 68 68  Resp: 14 18  Temp: 99.4 F (37.4 C) 98.2 F (36.8 C)  SpO2: 95% 96%   Vitals:   07/20/20 0500 07/20/20 0544 07/20/20 0735 07/20/20 1122  BP: (!) 128/57  (!) 142/55 (!) 131/59  Pulse: 65  68 68  Resp: 18  14 18   Temp: 98.5 F (36.9 C)  99.4 F (37.4 C) 98.2 F (36.8 C)  TempSrc: Oral  Oral Oral  SpO2: 95%  95% 96%  Weight:  82.5 kg    Height:        General: Pt  is alert, awake, not in acute distress Cardiovascular: RRR, S1/S2 +, no rubs, no gallops Respiratory: CTA bilaterally, no wheezing, no rhonchi Abdominal: Soft, NT, ND, bowel sounds + Extremities: no edema, no cyanosis    The results of significant diagnostics from this hospitalization (including imaging, microbiology, ancillary and laboratory) are listed below for reference.     Microbiology: Recent Results (from the past 240 hour(s))  Resp Panel by RT-PCR (Flu A&B, Covid) Nasopharyngeal Swab     Status: None   Collection Time: 07/10/20 10:05 PM   Specimen: Nasopharyngeal Swab; Nasopharyngeal(NP) swabs in vial transport medium  Result Value Ref Range Status   SARS Coronavirus 2 by RT PCR NEGATIVE NEGATIVE Final    Comment: (NOTE) SARS-CoV-2 target nucleic acids are NOT DETECTED.  The  SARS-CoV-2 RNA is generally detectable in upper respiratory specimens during the acute phase of infection. The lowest concentration of SARS-CoV-2 viral copies this assay can detect is 138 copies/mL. A negative result does not preclude SARS-Cov-2 infection and should not be used as the sole basis for treatment or other patient management decisions. A negative result may occur with  improper specimen collection/handling, submission of specimen other than nasopharyngeal swab, presence of viral mutation(s) within the areas targeted by this assay, and inadequate number of viral copies(<138 copies/mL). A negative result must be combined with clinical observations, patient history, and epidemiological information. The expected result is Negative.  Fact Sheet for Patients:  EntrepreneurPulse.com.au  Fact Sheet for Healthcare Providers:  IncredibleEmployment.be  This test is no t yet approved or cleared by the [place] FDA and  has been authorized for detection and/or diagnosis of SARS-CoV-2 by FDA under an Emergency Use Authorization (EUA). This EUA will remain  in effect (meaning this test can be used) for the duration of the COVID-19 declaration under Section 564(b)(1) of the Act, 21 U.S.C.section 360bbb-3(b)(1), unless the authorization is terminated  or revoked sooner.       Influenza A by PCR NEGATIVE NEGATIVE Final   Influenza B by PCR NEGATIVE NEGATIVE Final    Comment: (NOTE) The Xpert Xpress SARS-CoV-2/FLU/RSV plus assay is intended as an aid in the diagnosis of influenza from Nasopharyngeal swab specimens and should not be used as a sole basis for treatment. Nasal washings and aspirates are unacceptable for Xpert Xpress SARS-CoV-2/FLU/RSV testing.  Fact Sheet for Patients: EntrepreneurPulse.com.au  Fact Sheet for Healthcare Providers: IncredibleEmployment.be  This test is not yet approved or  cleared by the [place] FDA and has been authorized for detection and/or diagnosis of SARS-CoV-2 by FDA under an Emergency Use Authorization (EUA). This EUA will remain in effect (meaning this test can be used) for the duration of the COVID-19 declaration under Section 564(b)(1) of the Act, 21 U.S.C. section 360bbb-3(b)(1), unless the authorization is terminated or revoked.  Performed at [facility], [address] [place], [place] [address]   Blood culture (single)     Status: None   Collection Time: 07/10/20 11:53 PM   Specimen: BLOOD  Result Value Ref Range Status   Specimen Description BLOOD RIGHT [facility]   Final   Special Requests   Final    BOTTLES DRAWN AEROBIC AND ANAEROBIC Blood Culture adequate volume   Culture   Final    NO GROWTH 5 DAYS Performed at [facility], [address] [place], [place] [address]    Report Status 07/16/2020 FINAL  Final  MRSA PCR Screening     Status: None   Collection Time: 07/11/20  2:00  AM   Specimen: Nasopharyngeal  Result Value Ref Range Status   MRSA by PCR NEGATIVE NEGATIVE Final    Comment:        The GeneXpert MRSA Assay (FDA approved for NASAL specimens only), is one component of a comprehensive MRSA colonization surveillance program. It is not intended to diagnose MRSA infection nor to guide or monitor treatment for MRSA infections. Performed at [facility], [address] [place], [place] [address]   Culture, Respiratory w Gram Stain     Status: None   Collection Time: 07/12/20 10:41 AM   Specimen: Tracheal Aspirate; Respiratory  Result Value Ref Range Status   Specimen Description   Final    TRACHEAL ASPIRATE Performed at [facility], [address] [place], [place] [address]    Special Requests   Final    NONE Performed at [facility], [address] [place], [place] [address]    Gram Stain   Final    RARE WBC PRESENT, PREDOMINANTLY PMN NO ORGANISMS  SEEN    Culture   Final    Normal respiratory flora-no Staph aureus or Pseudomonas seen Performed at [facility] [address], [place], [place] [address]    Report Status 07/14/2020 FINAL  Final     Labs: BNP (last 3 results) No results for input(s): BNP in the last 8760 hours. Basic Metabolic Panel: Recent Labs  Lab 07/14/20 0200 07/14/20 1630 07/15/20 0500 07/16/20 0459 07/17/20 0825 07/17/20 0832 07/18/20 0834 07/19/20 0414 07/20/20 0621  NA 132*  --  138 139 139 138 133* 137 135  K 4.0   < > 3.2* 3.1* 4.6 4.5 5.2* 4.8 6.0*  CL 100  --  106 109 108 107 103 106 105  CO2 22  --  24 22 23 23 22 24 23   GLUCOSE 451*  --  123* 66* 77 77 145* 67* 148*  BUN 46*  --  46* 43* 42* 42* 37* 33* 31*  CREATININE 2.85*  --  2.60* 2.42* 2.69* 2.63* 2.33* 2.31* 2.44*  CALCIUM 7.7*  --  8.4* 7.7* 8.8* 8.6* 8.5* 9.1 9.0  MG 2.3  --  2.1 1.9 2.5*  --  2.1 2.0 1.9  PHOS 2.6  --  2.3* 3.1 3.6  --   --   --  3.5   < > = values in this interval not displayed.   Liver Function Tests: No results for input(s): AST, ALT, ALKPHOS, BILITOT, PROT, ALBUMIN in the last 168 hours. No results for input(s): LIPASE, AMYLASE in the last 168 hours. No results for input(s): AMMONIA in the last 168 hours. CBC: Recent Labs  Lab 07/15/20 0500 07/16/20 0459 07/17/20 0825 07/17/20 0832 07/18/20 0834  WBC 4.8 4.5 6.9 6.9 6.0  HGB 10.4* 10.6* 11.0* 11.1* 9.9*  HCT 29.5* 31.1* 32.8* 32.9* 30.6*  MCV 87.5 89.6 91.4 89.9 93.0  PLT 94* 121* 138* 143* 143*   Cardiac Enzymes: No results for input(s): CKTOTAL, CKMB, CKMBINDEX, TROPONINI in the last 168 hours. BNP: Invalid input(s): POCBNP CBG: Recent Labs  Lab 07/19/20 2020 07/20/20 0018 07/20/20 0507 07/20/20 0738 07/20/20 1123  GLUCAP 150* 115* 142* 128* 173*   D-Dimer No results for input(s): DDIMER in the last 72 hours. Hgb A1c No results for input(s): HGBA1C in the last 72 hours. Lipid Profile No results for input(s): CHOL, HDL,  LDLCALC, TRIG, CHOLHDL, LDLDIRECT in the last 72 hours. Thyroid function studies No results for input(s): TSH, T4TOTAL, T3FREE,  THYROIDAB in the last 72 hours.  Invalid input(s): FREET3 Anemia work up No results for input(s): VITAMINB12, FOLATE, FERRITIN, TIBC, IRON, RETICCTPCT in the last 72 hours. Urinalysis    Component Value Date/Time   COLORURINE STRAW (A) 07/10/2020 2247   APPEARANCEUR CLEAR (A) 07/10/2020 2247   LABSPEC 1.021 07/10/2020 2247   PHURINE 5.0 07/10/2020 2247   GLUCOSEU >=500 (A) 07/10/2020 2247   HGBUR MODERATE (A) 07/10/2020 2247   BILIRUBINUR NEGATIVE 07/10/2020 2247   KETONESUR 80 (A) 07/10/2020 2247   PROTEINUR 30 (A) 07/10/2020 2247   NITRITE NEGATIVE 07/10/2020 2247   LEUKOCYTESUR NEGATIVE 07/10/2020 2247   Sepsis Labs Invalid input(s): PROCALCITONIN,  WBC,  LACTICIDVEN Microbiology Recent Results (from the past 240 hour(s))  Resp Panel by RT-PCR (Flu A&B, Covid) Nasopharyngeal Swab     Status: None   Collection Time: 07/10/20 10:05 PM   Specimen: Nasopharyngeal Swab; Nasopharyngeal(NP) swabs in vial transport medium  Result Value Ref Range Status   SARS Coronavirus 2 by RT PCR NEGATIVE NEGATIVE Final    Comment: (NOTE) SARS-CoV-2 target nucleic acids are NOT DETECTED.  The SARS-CoV-2 RNA is generally detectable in upper respiratory specimens during the acute phase of infection. The lowest concentration of SARS-CoV-2 viral copies this assay can detect is 138 copies/mL. A negative result does not preclude SARS-Cov-2 infection and should not be used as the sole basis for treatment or other patient management decisions. A negative result may occur with  improper specimen collection/handling, submission of specimen other than nasopharyngeal swab, presence of viral mutation(s) within the areas targeted by this assay, and inadequate number of viral copies(<138 copies/mL). A negative result must be combined with clinical observations, patient history,  and epidemiological information. The expected result is Negative.  Fact Sheet for Patients:  EntrepreneurPulse.com.au  Fact Sheet for Healthcare Providers:  IncredibleEmployment.be  This test is no t yet approved or cleared by the [place] FDA and  has been authorized for detection and/or diagnosis of SARS-CoV-2 by FDA under an Emergency Use Authorization (EUA). This EUA will remain  in effect (meaning this test can be used) for the duration of the COVID-19 declaration under Section 564(b)(1) of the Act, 21 U.S.C.section 360bbb-3(b)(1), unless the authorization is terminated  or revoked sooner.       Influenza A by PCR NEGATIVE NEGATIVE Final   Influenza B by PCR NEGATIVE NEGATIVE Final    Comment: (NOTE) The Xpert Xpress SARS-CoV-2/FLU/RSV plus assay is intended as an aid in the diagnosis of influenza from Nasopharyngeal swab specimens and should not be used as a sole basis for treatment. Nasal washings and aspirates are unacceptable for Xpert Xpress SARS-CoV-2/FLU/RSV testing.  Fact Sheet for Patients: EntrepreneurPulse.com.au  Fact Sheet for Healthcare Providers: IncredibleEmployment.be  This test is not yet approved or cleared by the [place] FDA and has been authorized for detection and/or diagnosis of SARS-CoV-2 by FDA under an Emergency Use Authorization (EUA). This EUA will remain in effect (meaning this test can be used) for the duration of the COVID-19 declaration under Section 564(b)(1) of the Act, 21 U.S.C. section 360bbb-3(b)(1), unless the authorization is terminated or revoked.  Performed at [facility], [address] [place], [place] [address]   Blood culture (single)     Status: None   Collection Time: 07/10/20 11:53 PM   Specimen: BLOOD  Result Value Ref Range Status   Specimen Description BLOOD RIGHT [facility]   Final   Special Requests   Final    BOTTLES DRAWN  AEROBIC AND ANAEROBIC Blood Culture adequate volume   Culture   Final    NO GROWTH 5 DAYS Performed at [facility], [address] [place], [place] [address]    Report Status 07/16/2020 FINAL  Final  MRSA PCR Screening     Status: None   Collection Time: 07/11/20  2:00 AM   Specimen: Nasopharyngeal  Result Value Ref Range Status   MRSA by PCR NEGATIVE NEGATIVE Final    Comment:        The GeneXpert MRSA Assay (FDA approved for NASAL specimens only), is one component of a comprehensive MRSA colonization surveillance program. It is not intended to diagnose MRSA infection nor to guide or monitor treatment for MRSA infections. Performed at [facility], [address] [place], [place] [address]   Culture, Respiratory w Gram Stain     Status: None   Collection Time: 07/12/20 10:41 AM   Specimen: Tracheal Aspirate; Respiratory  Result Value Ref Range Status   Specimen Description   Final    TRACHEAL ASPIRATE Performed at [facility], [address] [place], [place] [address]    Special Requests   Final    NONE Performed at [facility], [address] [place], [place] [address]    Gram Stain   Final    RARE WBC PRESENT, PREDOMINANTLY PMN NO ORGANISMS SEEN    Culture   Final    Normal respiratory flora-no Staph aureus or Pseudomonas seen Performed at [facility] [address], [place], [place] [address]    Report Status 07/14/2020 FINAL  Final     Time coordinating discharge: Over 30 minutes  SIGNED:   Sidney Ace, MD  [facility] 07/20/2020, 12:44 PM Pager   If 7PM-7AM, please contact night-coverage

## 2020-07-22 ENCOUNTER — Ambulatory Visit: Admit: 2020-07-22 | Payer: MEDICARE

## 2020-07-29 ENCOUNTER — Encounter: Payer: Self-pay | Admitting: Pharmacist

## 2020-07-29 ENCOUNTER — Other Ambulatory Visit: Payer: Self-pay

## 2020-07-29 ENCOUNTER — Encounter: Payer: Self-pay | Admitting: Family

## 2020-07-29 ENCOUNTER — Ambulatory Visit: Payer: Medicare Other | Attending: Family | Admitting: Family

## 2020-07-29 VITALS — BP 140/96 | HR 86 | Resp 16 | Ht 69.0 in | Wt 156.4 lb

## 2020-07-29 DIAGNOSIS — R062 Wheezing: Secondary | ICD-10-CM | POA: Diagnosis not present

## 2020-07-29 DIAGNOSIS — K219 Gastro-esophageal reflux disease without esophagitis: Secondary | ICD-10-CM | POA: Insufficient documentation

## 2020-07-29 DIAGNOSIS — R059 Cough, unspecified: Secondary | ICD-10-CM | POA: Insufficient documentation

## 2020-07-29 DIAGNOSIS — Z7984 Long term (current) use of oral hypoglycemic drugs: Secondary | ICD-10-CM | POA: Insufficient documentation

## 2020-07-29 DIAGNOSIS — I4891 Unspecified atrial fibrillation: Secondary | ICD-10-CM | POA: Diagnosis not present

## 2020-07-29 DIAGNOSIS — R002 Palpitations: Secondary | ICD-10-CM | POA: Diagnosis not present

## 2020-07-29 DIAGNOSIS — K7460 Unspecified cirrhosis of liver: Secondary | ICD-10-CM | POA: Diagnosis not present

## 2020-07-29 DIAGNOSIS — N184 Chronic kidney disease, stage 4 (severe): Secondary | ICD-10-CM

## 2020-07-29 DIAGNOSIS — E1122 Type 2 diabetes mellitus with diabetic chronic kidney disease: Secondary | ICD-10-CM

## 2020-07-29 DIAGNOSIS — R079 Chest pain, unspecified: Secondary | ICD-10-CM | POA: Insufficient documentation

## 2020-07-29 DIAGNOSIS — Z950 Presence of cardiac pacemaker: Secondary | ICD-10-CM | POA: Diagnosis not present

## 2020-07-29 DIAGNOSIS — R0602 Shortness of breath: Secondary | ICD-10-CM | POA: Insufficient documentation

## 2020-07-29 DIAGNOSIS — Z79899 Other long term (current) drug therapy: Secondary | ICD-10-CM | POA: Insufficient documentation

## 2020-07-29 DIAGNOSIS — G479 Sleep disorder, unspecified: Secondary | ICD-10-CM | POA: Insufficient documentation

## 2020-07-29 DIAGNOSIS — I272 Pulmonary hypertension, unspecified: Secondary | ICD-10-CM

## 2020-07-29 DIAGNOSIS — B1920 Unspecified viral hepatitis C without hepatic coma: Secondary | ICD-10-CM | POA: Insufficient documentation

## 2020-07-29 DIAGNOSIS — N189 Chronic kidney disease, unspecified: Secondary | ICD-10-CM | POA: Diagnosis not present

## 2020-07-29 DIAGNOSIS — R42 Dizziness and giddiness: Secondary | ICD-10-CM | POA: Insufficient documentation

## 2020-07-29 DIAGNOSIS — I509 Heart failure, unspecified: Secondary | ICD-10-CM | POA: Diagnosis not present

## 2020-07-29 DIAGNOSIS — G8929 Other chronic pain: Secondary | ICD-10-CM | POA: Diagnosis not present

## 2020-07-29 DIAGNOSIS — I5020 Unspecified systolic (congestive) heart failure: Secondary | ICD-10-CM

## 2020-07-29 DIAGNOSIS — Z7901 Long term (current) use of anticoagulants: Secondary | ICD-10-CM | POA: Insufficient documentation

## 2020-07-29 DIAGNOSIS — Z944 Liver transplant status: Secondary | ICD-10-CM | POA: Insufficient documentation

## 2020-07-29 DIAGNOSIS — I480 Paroxysmal atrial fibrillation: Secondary | ICD-10-CM

## 2020-07-29 NOTE — Progress Notes (Signed)
[facility] - PHARMACIST COUNSELING NOTE  ADHERENCE ASSESSMENT  Adherence strategy: Wife helps him   Do you ever forget to take your medication? [] Yes (1) [x] No (0)  Do you ever skip doses due to side effects? [] Yes (1) [x] No (0)  Do you have trouble affording your medicines? [] Yes (1) [x] No (0)  Are you ever unable to pick up your medication due to transportation difficulties? [] Yes (1) [x] No (0)  Do you ever stop taking your medications because you don't believe they are helping? [] Yes (1) [x] No (0)    Guideline-Directed Medical Therapy/Evidence Based Medicine  ACE/ARB/ARNI: none Beta Blocker: Toprol Aldosterone Antagonist: none (previously on spironolactone - d/c'd due to hyperkalemia and AKI) Diuretic: none    SUBJECTIVE  HPI: 70 yo male with a history of liver cancer, DM, CKD, GERD, afib, hepatitic C, pulmonary HTN, liver transplant, and chronic heart failure  Past Medical History:  Diagnosis Date  . Atrial fibrillation ([place])   . BPH (benign prostatic hyperplasia)   . Cancer ([place])    liver  . CHF (congestive heart failure) ([place])   . Chronic kidney disease   . Cirrhosis ([place])   . Diabetes mellitus without complication ([employer])   . GERD (gastroesophageal reflux disease)   . Hepatitis C   . Hx of liver transplant ([place])   . Pulmonary HTN ([facility])       OBJECTIVE   Vital signs: BP 140/96, weight (pounds) 156.6 ECHO: Date 07/13/20, EF 40-45%, notes LV diastolic parameters consistent with Grade I  BMP Latest Ref Rng & Units 07/20/2020 07/19/2020 07/18/2020  Glucose 70 - 99 mg/dL 148(H) 67(L) 145(H)  BUN 8 - 23 mg/dL 31(H) 33(H) 37(H)  Creatinine 0.61 - 1.24 mg/dL 2.44(H) 2.31(H) 2.33(H)  Sodium 135 - 145 mmol/L 135 137 133(L)  Potassium 3.5 - 5.1 mmol/L 6.0(H) 4.8 5.2(H)  Chloride 98 - 111 mmol/L 105 106 103  CO2 22 - 32 mmol/L 23 24 22   Calcium 8.9 - 10.3 mg/dL 9.0 9.1 8.5(L)    ASSESSMENT/PLAN Chronic heart  failure -continue metoprolol  -pt has history of hyperkalemia (last K was 6.0 07/20/20) and CKD (Scr 07/20/20 2.44); previous baseline was 1.6 (2019), however 2.4 may be his new baseline. GDMT may be escalated based on patients renal function and vitals  Afib -continue warfarin -continue amiodarone 200mg  BID for 7 days then pt is to switch to 200mg  daily  Diabetes -continue Ozempic and rosuvastatin  Pulmonary HTN -continue revatio  HCV cirrhosis -liver transplant 2018; taking cyclosporine and mycophenolate    Time spent: 10 minutes  Sherilyn Banker, PharmD Pharmacy Resident  07/29/2020 2:33 PM    Current Outpatient Medications:  .  alendronate (FOSAMAX) 70 MG tablet, Take 70 mg by mouth once a week. Take with a full glass of water on an empty stomach., Disp: , Rfl:  .  amiodarone (PACERONE) 200 MG tablet, Take 1 tablet (200 mg total) by mouth 2 (two) times daily for 7 days, THEN 1 tablet (200 mg total) daily., Disp: 44 tablet, Rfl: 0 .  Cholecalciferol (VITAMIN D3) 125 MCG (5000 UT) CAPS, Take 3,000 Units by mouth daily., Disp: , Rfl:  .  cycloSPORINE modified (NEORAL) 100 MG capsule, Take 200 mg by mouth daily., Disp: , Rfl:  .  cycloSPORINE modified (NEORAL) 25 MG capsule, Take 175 mg by mouth every evening., Disp: , Rfl:  .  DULoxetine (CYMBALTA) 30 MG capsule, Take 30 mg by mouth daily., Disp: , Rfl:  .  Insulin Glargine (BASAGLAR KWIKPEN) 100 UNIT/ML, Inject 12 Units into the skin daily as needed. (Patient not taking: Reported on 07/29/2020), Disp: , Rfl:  .  Melatonin 3 MG TABS, Take 3 mg by mouth., Disp: , Rfl:  .  metoprolol succinate (TOPROL-XL) 25 MG 24 hr tablet, Take 1 tablet (25 mg total) by mouth 2 (two) times daily., Disp: 60 tablet, Rfl: 0 .  mirtazapine (REMERON) 15 MG tablet, Take 15 mg by mouth at bedtime. , Disp: , Rfl:  .  mycophenolate (CELLCEPT) 250 MG capsule, Take 500 mg by mouth in the morning and at bedtime., Disp: , Rfl:  .  omeprazole (PRILOSEC) 20 MG  capsule, Take 20 mg by mouth daily., Disp: , Rfl:  .  ondansetron (ZOFRAN) 4 MG tablet, Take 1 tablet (4 mg total) by mouth every 6 (six) hours as needed for nausea., Disp: 20 tablet, Rfl: 0 .  OZEMPIC, 0.25 OR 0.5 MG/DOSE, 2 MG/1.5ML SOPN, Inject 0.5 mg into the skin once a week., Disp: , Rfl:  .  OZEMPIC, 1 MG/DOSE, 4 MG/3ML SOPN, Inject 1 mg into the skin once a week. (Patient not taking: Reported on 07/29/2020), Disp: , Rfl:  .  rosuvastatin (CRESTOR) 5 MG tablet, Take 5 mg by mouth daily., Disp: , Rfl:  .  sildenafil (REVATIO) 20 MG tablet, Take 40 mg by mouth 3 (three) times daily. , Disp: , Rfl:  .  sodium bicarbonate 650 MG tablet, Take 650 mg by mouth in the morning and at bedtime., Disp: , Rfl:  .  Specialty Vitamins Products (MAGNESIUM, AMINO ACID CHELATE,) 133 MG tablet, Take 2 tablets by mouth 2 (two) times daily., Disp: , Rfl:  .  Tacrine Hydrochloride (COGNEX PO), Take 2 capsules by mouth daily at 6 (six) AM. With a glass of water, Disp: , Rfl:  .  warfarin (COUMADIN) 2.5 MG tablet, Take 1 tablet (2.5 mg total) by mouth daily., Disp: 30 tablet, Rfl: 11   COUNSELING POINTS/CLINICAL PEARLS Metoprolol Succinate (Goal: 200 mg once daily) Warn patient to avoid activities requiring mental alertness or coordination until drug effects are realized, as drug may cause dizziness. Tell patient planning major surgery with anesthesia to alert physician that drug is being used, as drug impairs ability of heart to respond to reflex adrenergic stimuli. Drug may cause diarrhea, fatigue, headache, or depression. Advise diabetic patient to carefully monitor blood glucose as drug may mask symptoms of hypoglycemia. Patient should take extended-release tablet with or immediately following meals. Counsel patient against sudden discontinuation of drug, as this may precipitate hypertension, angina, or myocardial infarction. In the event of a missed dose, counsel patient to skip the missed dose and maintain a  regular dosing schedule.  DRUGS TO AVOID IN HEART FAILURE  Drug or Class Mechanism  Analgesics . NSAIDs . COX-2 inhibitors . Glucocorticoids  Sodium and water retention, increased systemic vascular resistance, decreased response to diuretics   Diabetes Medications . Metformin . Thiazolidinediones o Rosiglitazone (Avandia) o Pioglitazone (Actos) . DPP4 Inhibitors o Saxagliptin (Onglyza) o Sitagliptin (Januvia)   Lactic acidosis Possible calcium channel blockade   Unknown  Antiarrhythmics . Class I  o Flecainide o Disopyramide . Class III o Sotalol . Other o Dronedarone  Negative inotrope, proarrhythmic   Proarrhythmic, beta blockade  Negative inotrope  Antihypertensives . Alpha Blockers o Doxazosin . Calcium Channel Blockers o Diltiazem o Verapamil o Nifedipine . Central Alpha Adrenergics o Moxonidine . Peripheral Vasodilators o Minoxidil  Increases renin and aldosterone  Negative inotrope  Possible sympathetic withdrawal  Unknown  Anti-infective . Itraconazole . Amphotericin B  Negative inotrope Unknown  Hematologic . Anagrelide . Cilostazol   Possible inhibition of PD IV Inhibition of PD III causing arrhythmias  Neurologic/Psychiatric . Stimulants . Anti-Seizure Drugs o Carbamazepine o Pregabalin . Antidepressants o Tricyclics o Citalopram . Parkinsons o Bromocriptine o Pergolide o Pramipexole . Antipsychotics o Clozapine . Antimigraine o Ergotamine o Methysergide . Appetite suppressants . Bipolar o Lithium  Peripheral alpha and beta agonist activity  Negative inotrope and chronotrope Calcium channel blockade  Negative inotrope, proarrhythmic Dose-dependent QT prolongation  Excessive serotonin activity/valvular damage Excessive serotonin activity/valvular damage Unknown  IgE mediated hypersensitivy, calcium channel blockade  Excessive serotonin activity/valvular damage Excessive serotonin activity/valvular  damage Valvular damage  Direct myofibrillar degeneration, adrenergic stimulation  Antimalarials . Chloroquine . Hydroxychloroquine Intracellular inhibition of lysosomal enzymes  Urologic Agents . Alpha Blockers o Doxazosin o Prazosin o Tamsulosin o Terazosin  Increased renin and aldosterone  Adapted from Page RL, et al. "Drugs That May Cause or Exacerbate Heart Failure: A Scientific Statement from the [place]." Circulation 2016; 188:C16-S06. DOI: 10.1161/CIR.0000000000000426   MEDICATION ADHERENCES TIPS AND STRATEGIES 1. Taking medication as prescribed improves patient outcomes in heart failure (reduces hospitalizations, improves symptoms, increases survival) 2. Side effects of medications can be managed by decreasing doses, switching agents, stopping drugs, or adding additional therapy. Please let someone in the [facility] know if you have having bothersome side effects so we can modify your regimen. Do not alter your medication regimen without talking to [place].  3. Medication reminders can help patients remember to take drugs on time. If you are missing or forgetting doses you can try linking behaviors, using pill boxes, or an electronic reminder like an alarm on your phone or an app. Some people can also get automated phone calls as medication reminders.

## 2020-07-29 NOTE — Progress Notes (Signed)
Patient ID: Hector Mueller, male    DOB: 08-18-1950, 70 y.o.   MRN: [id number]  HPI  Hector Mueller is a 70 y/o male with a history of liver cancer, DM, CKD, GERD, atrial fibrillation, hepatitis C, pulmonary HTN, liver transplant and chronic heart failure.   Echo report from 07/13/20 reviewed and showed an EF of 40-45%.  Admitted 07/10/20 due to altered mental status and difficulty with movement. Hyperglycemic with glucose of 667. CT negative for stroke. Unable to perform MRI due to pacemaker. Cardiology and neurology consults obtained. Initially needed to be intubated due to compromised airway but successfully extubated. Successful cardioversion due to atrial fibrillation. Coffee ground emesis resolved. Had AKI. Discharged after 10 days.   He presents today for his initial visit with a chief complaint of moderate shortness of breath with little exertion. He describes this as chronic in nature having been present for several years with varying levels of severity. He has associated fatigue, decreased appetite, cough, wheezing, chest pain, palpitations, dizziness, easy bruising, difficulty sleeping and chronic pain along with this. He denies any abdominal distention or pedal edema.   He says that he's not weighing himself because he can't safely stand on the scales. Is waiting for PT to start.   Past Medical History:  Diagnosis Date  . Atrial fibrillation ([place])   . BPH (benign prostatic hyperplasia)   . Cancer ([place])    liver  . CHF (congestive heart failure) ([place])   . Chronic kidney disease   . Cirrhosis ([place])   . Diabetes mellitus without complication ([place])   . GERD (gastroesophageal reflux disease)   . Hepatitis C   . Hx of liver transplant ([place])   . Pulmonary HTN ([place])    Past Surgical History:  Procedure Laterality Date  . ANKLE FRACTURE SURGERY Bilateral   . BACK SURGERY    . CLAVICLE SURGERY    . INTRAMEDULLARY (IM) NAIL INTERTROCHANTERIC Right 11/28/2017   Procedure: INTRAMEDULLARY  (IM) NAIL INTERTROCHANTRIC;  Surgeon: Earnestine Leys, MD;  Location: [facility] ORS;  Service: Orthopedics;  Laterality: Right;  . LIVER SURGERY     History reviewed. No pertinent family history. Social History   Tobacco Use  . Smoking status: Never Smoker  . Smokeless tobacco: Never Used  Substance Use Topics  . Alcohol use: No   No Known Allergies   Prior to Admission medications   Medication Sig Start Date End Date Taking? Authorizing Provider  alendronate (FOSAMAX) 70 MG tablet Take 70 mg by mouth once a week. Take with a full glass of water on an empty stomach.   Yes [provider]  amiodarone (PACERONE) 200 MG tablet Take 1 tablet (200 mg total) by mouth 2 (two) times daily for 7 days, THEN 1 tablet (200 mg total) daily. 07/21/20 08/27/20 Yes Sreenath, Sudheer B, MD  Cholecalciferol (VITAMIN D3) 125 MCG (5000 UT) CAPS Take 3,000 Units by mouth daily.   Yes [provider]  cycloSPORINE modified (NEORAL) 100 MG capsule Take 200 mg by mouth daily.   Yes [provider]  cycloSPORINE modified (NEORAL) 25 MG capsule Take 175 mg by mouth every evening.   Yes [provider]  DULoxetine (CYMBALTA) 30 MG capsule Take 30 mg by mouth daily. 05/22/20  Yes [provider]  Melatonin 3 MG TABS Take 3 mg by mouth. 02/06/17  Yes [provider]  metoprolol succinate (TOPROL-XL) 25 MG 24 hr tablet Take 1 tablet (25 mg total) by mouth 2 (two) times daily. 07/20/20  08/19/20 Yes Sreenath, Sudheer B, MD  mirtazapine (REMERON) 15 MG tablet Take 15 mg by mouth at bedtime.  10/18/17  Yes [provider]  mycophenolate (CELLCEPT) 250 MG capsule Take 500 mg by mouth in the morning and at bedtime. 04/14/20 04/14/21 Yes [provider]  omeprazole (PRILOSEC) 20 MG capsule Take 20 mg by mouth daily.   Yes [provider]  ondansetron (ZOFRAN) 4 MG tablet Take 1 tablet (4 mg total) by mouth every 6 (six) hours as needed for nausea. 12/01/17  Yes  Vaughan Basta, MD  OZEMPIC, 0.25 OR 0.5 MG/DOSE, 2 MG/1.5ML SOPN Inject 0.5 mg into the skin once a week. 05/04/20  Yes [provider]  rosuvastatin (CRESTOR) 5 MG tablet Take 5 mg by mouth daily. 06/09/20  Yes [provider]  sildenafil (REVATIO) 20 MG tablet Take 40 mg by mouth 3 (three) times daily.  10/17/17  Yes [provider]  sodium bicarbonate 650 MG tablet Take 650 mg by mouth in the morning and at bedtime. 03/18/20 03/18/21 Yes [provider]  Specialty Vitamins Products (MAGNESIUM, AMINO ACID CHELATE,) 133 MG tablet Take 2 tablets by mouth 2 (two) times daily. 03/02/17  Yes [provider]  Tacrine Hydrochloride (COGNEX PO) Take 2 capsules by mouth daily at 6 (six) AM. With a glass of water   Yes [provider]  warfarin (COUMADIN) 2.5 MG tablet Take 1 tablet (2.5 mg total) by mouth daily. 07/20/20 07/20/21 Yes Sreenath, Sudheer B, MD  Insulin Glargine (BASAGLAR KWIKPEN) 100 UNIT/ML Inject 12 Units into the skin daily as needed. Patient not taking: Reported on 07/29/2020    [provider]  OZEMPIC, 1 MG/DOSE, 4 MG/3ML SOPN Inject 1 mg into the skin once a week. Patient not taking: Reported on 07/29/2020 05/19/20   [provider]    Review of Systems  Constitutional: Positive for appetite change (decreased) and fatigue.  HENT: Negative for congestion, postnasal drip and sore throat.   Eyes: Negative.   Respiratory: Positive for cough (productive), shortness of breath and wheezing.   Cardiovascular: Positive for chest pain and palpitations. Negative for leg swelling.  Gastrointestinal: Negative for abdominal distention and abdominal pain.  Endocrine: Negative.   Genitourinary: Negative.   Musculoskeletal: Positive for arthralgias (knee) and back pain. Negative for neck pain.  Skin: Negative.   Allergic/Immunologic: Negative.   Neurological: Positive for dizziness and light-headedness.  Hematological: Negative  for adenopathy. Bruises/bleeds easily.  Psychiatric/Behavioral: Positive for sleep disturbance. Negative for dysphoric mood. The patient is not nervous/anxious.    Vitals:   07/29/20 1355  BP: (!) 140/96  Pulse: 86  Resp: 16  SpO2: 98%  Weight: 156 lb 6 oz (70.9 kg)  Height: 5\' 9"  (1.753 m)   Wt Readings from Last 3 Encounters:  07/29/20 156 lb 6 oz (70.9 kg)  07/20/20 181 lb 14.4 oz (82.5 kg)  12/01/17 202 lb (91.6 kg)   Lab Results  Component Value Date   CREATININE 2.44 (H) 07/20/2020   CREATININE 2.31 (H) 07/19/2020   CREATININE 2.33 (H) 07/18/2020    Physical Exam Vitals and nursing note reviewed.  Constitutional:      Appearance: He is well-developed.  HENT:     Head: Normocephalic and atraumatic.  Neck:     Vascular: No JVD.  Cardiovascular:     Rate and Rhythm: Normal rate and regular rhythm.  Pulmonary:     Effort: Pulmonary effort is normal. No respiratory distress.     Breath sounds:  No rhonchi or rales.  Abdominal:     Palpations: Abdomen is soft.     Tenderness: There is no abdominal tenderness.  Musculoskeletal:     Cervical back: Neck supple.     Right lower leg: No tenderness. No edema.     Left lower leg: No tenderness. No edema.  Skin:    General: Skin is warm and dry.  Neurological:     General: No focal deficit present.     Mental Status: He is alert and oriented to person, place, and time.  Psychiatric:        Mood and Affect: Mood normal.        Behavior: Behavior normal.    Assessment & Plan:  1: Chronic heart failure with mildly reduced ejection fraction- - NYHA class III - euvolemic today - not weighing daily because he says that he can't safely stand on the scales; instructed that when he can safely weigh that he should call for an overnight weight gain of > 2 pounds or a weekly weight gain of > 5 pounds - waiting on PT to start this week - not adding salt to his food and is trying to monitor his sodium intake - low sodium  cookbook given to patient - PharmD reconciled medications   2: DM with CKD- - saw PCP Venetia Maxon) 06/18/20 - saw endocrinology Ronnald Ramp) 05/18/20 - due to see nephrology on 07/31/20 - BMP results from 07/20/20 reviewed and showed sodium 135, potassium 6.0, creatinine 2.44 and GFR 27 - explained that his BMP needs to be rechecked due to hyperkalemia and renal function; he defers today and says that he will have nephrology recheck it in 2 days  3: Atrial fibrillation- - INR 07/20/20 ws 2.6 - currently on amiodarone, metoprolol and warfarin  4: Pulmonary HTN- - saw cardiology Marijean Bravo) 11/19/19  5: HCV cirrhosis- - liver transplant done 2018   Patient did not bring his medications nor a list. Each medication was verbally reviewed with the patient and he was encouraged to bring the bottles to every visit to confirm accuracy of list.  Offered to make a follow-up appointment here but patient defers saying that he's seeing enough other providers as it is. Advised patient that he could call back at anytime for any questions or to make another appointment in the future.

## 2020-07-29 NOTE — Patient Instructions (Addendum)
Begin safely weighing daily if able and call for an overnight weight gain of > 2 pounds or a weekly weight gain of >5 pounds.   Call us in the future if you have any questions or if you'd like to schedule another appointment.

## 2020-07-29 NOTE — Unmapped (Signed)
Left message for patient to call to reschedule 5.25 appointment with Dr. Bryson Corona due to change in providers schedule.

## 2020-07-31 ENCOUNTER — Ambulatory Visit: Admit: 2020-07-31 | Discharge: 2020-08-01 | Payer: MEDICARE

## 2020-07-31 DIAGNOSIS — R0600 Dyspnea, unspecified: Principal | ICD-10-CM

## 2020-07-31 DIAGNOSIS — I2721 Secondary pulmonary arterial hypertension: Principal | ICD-10-CM

## 2020-07-31 NOTE — Unmapped (Signed)
Thank you for visiting the [facility].    If you have any questions or concerns, please call     H. James Morgann Woodburn, MD    Laura Nowicki, RN  Sara Decker, RN    Pulmonary Hypertension nurse coordinators:    [phone]

## 2020-07-31 NOTE — Unmapped (Signed)
Clinic staff attempted blood draw but were not able to obtain full sample.  Pt requested to have labs drawn in [facility]. I placed orders.

## 2020-07-31 NOTE — Unmapped (Signed)
[facility] at [facility]   Pulmonary Hypertension Program                                                     H. Sherre Lain, MD--Director (Pulmonary)     Denice Bors. Tresa Res, MD--Associate Director (Pulmonary)    Freeman Caldron, MD (Cardiology)     Bonney Leitz, MD (Pulmonary)    Marva Panda, RN Nurse Coordinator     Claretha Cooper, RN Nurse Coordinator    [phone] office - [phone] fax          Jeffrey Ward  is a 70 y.o. male  patient referred by Jettie Booze, MD for work up and evaluation for pulmonary hypertension.    PAH Treatment History:  Sildenafil 40 mg tid since post liver txplt hospitalization.    Pt describes a history of recent liver txplt in 09/2016.  Post op was noted to have previously unrecognized PAH and RV failure, treated initially with inhaled iloprost while on vent support and then transitioned to sildenafil.  He had a long post op course requiring a stay in rehab of about 2 weeks prior to discharge.   His post op course was further complicated by AKI (on CRRT then had renal recovery), afib, bradycardia requiring pacemaker placement.  CTA negative for PE post op.      No LE edema currently.      He continues on sildenafil 40 tid and is receiving from [facility] pharmacy.      He is no longer on warfarin.    No syncope or near syncope.      Denies dyspnea, syncope or near syncope.      Had recent hip fx s/p surgery end of Sept 2019.  Currently undergoing rehab.      Has been having URI symptoms last two day.  Antibx was called in by his PCP.  No fevers.      INTERVAL HPI 11/19/19:    Patient reports doing well.  No changes since last visit aside from feeling a little more dyspneic with activities.  He has gained weight and thinks may be related to that.    No syncope or near syncope.  No LE edema noted.    Continues on sildenafil.      Patient refuses to get COVID vaccine.      INTERVAL HPI 07/31/20:    Patient continues to feel weakness and some dypsnea, but not changed compared to prior visit.    No LE edema noted.  No chest pain.     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    No syncope or near syncope.       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      The patient's current NYHA functional class is Class 2 - comfortable at rest but have symptoms with ordinary physcial activity.     Will get follow up echo and check pro-BNP.     Advised patient he is high risk for severe COVID disease given immunosuppression but he still refuses vaccine.    Overall he appears stable with no signs of RV failure clinically.  Plan:          1) continue sildenafil 40 mg tid.      2) next visit in 4 months    3) strongly recommended COVID vaccine.    4) pro BNP and BMP today    5) echo in coming weeks.      Subjective:      Past medical history is significant for:   Past Medical History:   Diagnosis Date   ??? Atrial fibrillation (CMS-HCC)    ??? Basal cell carcinoma    ??? Cancer (CMS-HCC)    ??? Cirrhosis (CMS-HCC)    ??? Depression    ??? Diabetes (CMS-HCC)    ??? Headache    ??? Hepatitis C 07/17/2012   ??? History of transfusion    ??? Liver disease    ??? Low back pain 07/17/2012   ??? Varices, esophageal (CMS-HCC)       Family History   Problem Relation Age of Onset   ??? Hypertension Mother    ??? Cancer Mother    ??? Heart disease Mother    ??? Heart disease Father    ??? Cirrhosis Neg Hx    ??? Liver cancer Neg Hx    ??? Anemia Neg Hx    ??? Diabetes Neg Hx    ??? Kidney disease Neg Hx    ??? Obesity Neg Hx    ??? Thyroid disease Neg Hx    ??? Osteoporosis Neg Hx    ??? Coronary artery disease Neg Hx    ??? Anesthesia problems Neg Hx    ??? Basal cell carcinoma Neg Hx    ??? Squamous cell carcinoma Neg Hx       Social History     Socioeconomic History   ??? Marital status: Married     Spouse name: Not on file   ??? Number of children: Not on file   ??? Years of education: Not on file   ??? Highest education level: Not on file   Occupational History   ??? Not on file   Tobacco Use   ??? Smoking status: Never Smoker   ??? Smokeless tobacco: Never Used   ??? Tobacco comment: Smoked in high school for about 5 years.    Substance and Sexual Activity   ??? Alcohol use: No   ??? Drug use: No   ??? Sexual activity: Yes     Partners: Female     Birth control/protection: None   Other Topics Concern   ??? Do you use sunscreen? Not Asked   ??? Tanning bed use? Not Asked   ??? Are you easily burned? Not Asked   ??? Excessive sun exposure? Not Asked   ??? Blistering sunburns? Not Asked   ??? Exercise No   ??? Living Situation No   Social History Narrative   ??? Not on file     Social Determinants of Health     Financial Resource Strain: Not on file   Food Insecurity: Not on file   Transportation Needs: Not on file   Physical Activity: Not on file   Stress: Not on file   Social Connections: Not on file      Past Surgical History:   Procedure Laterality Date   ??? ANKLE SURGERY     ??? BACK SURGERY     ??? BACK SURGERY     ??? CARDIAC SURGERY      pacemaker   ??? CHG US GUIDE, TISSUE ABLATION N/A 01/22/2016    Procedure:  ULTRASOUND GUIDANCE FOR, AND MONITORING OF, PARENCHYMAL TISSUE ABLATION;  Surgeon: Particia Nearing, MD;  Location: MAIN OR [facility];  Service: Transplant   ??? IR EMBOLIZATION ORGAN ISCHEMIA, TUMORS, INFAR  06/16/2016    IR EMBOLIZATION ORGAN ISCHEMIA, TUMORS, INFAR 06/16/2016 Ammie Dalton, MD IMG VIR H&V [facility]   ??? PR COLON CA SCRN NOT HI RSK IND N/A 02/27/2015    Procedure: COLOREC CNCR SCR;COLNSCPY NO;  Surgeon: Vonda Antigua, MD;  Location: GI PROCEDURES MEMORIAL [facility];  Service: Gastroenterology   ??? PR ENDOSCOPIC ULTRASOUND EXAM N/A 02/27/2015    Procedure: UGI ENDO; W/ENDO ULTRASOUND EXAM INCLUDES ESOPHAGUS, STOMACH, &/OR DUODENUM/JEJUNUM;  Surgeon: Vonda Antigua, MD;  Location: GI PROCEDURES MEMORIAL [facility];  Service: Gastroenterology   ??? PR ERCP BALLOON DILATE BILIARY/PANC DUCT/AMPULLA EA N/A 02/10/2017    Procedure: ERCP;WITH TRANS-ENDOSCOPIC BALLOON DILATION OF BILIARY/PANCREATIC DUCT(S) OR OF AMPULLA, INCLUDING SPHINCTERECTOMY, WHEN PERFOREMD,EACH DUCT (16109);  Surgeon: Mayford Knife, MD;  Location: GI PROCEDURES MEMORIAL [facility];  Service: Gastroenterology   ??? PR ERCP REMOVE FOREIGN BODY/STENT BILIARY/PANC DUCT N/A 02/10/2017    Procedure: ENDOSCOPIC RETROGRADE CHOLANGIOPANCREATOGRAPHY (ERCP); W/ REMOVAL OF FOREIGN BODY/STENT FROM BILIARY/PANCREATIC DUCT(S);  Surgeon: Mayford Knife, MD;  Location: GI PROCEDURES MEMORIAL [facility];  Service: Gastroenterology   ??? PR ERCP REMOVE FOREIGN BODY/STENT BILIARY/PANC DUCT N/A 06/29/2017    Procedure: ENDOSCOPIC RETROGRADE CHOLANGIOPANCREATOGRAPHY (ERCP); W/ REMOVAL OF FOREIGN BODY/STENT FROM BILIARY/PANCREATIC DUCT(S);  Surgeon: Vonda Antigua, MD;  Location: GI PROCEDURES MEMORIAL [facility];  Service: Gastroenterology   ??? PR ERCP STENT PLACEMENT BILIARY/PANCREATIC DUCT N/A 11/29/2016    Procedure: ENDOSCOPIC RETROGRADE CHOLANGIOPANCREATOGRAPHY (ERCP); WITH PLACEMENT OF ENDOSCOPIC STENT INTO BILIARY OR PANCREATIC DUCT;  Surgeon: Chriss Driver, MD;  Location: GI PROCEDURES MEMORIAL [facility];  Service: Gastroenterology   ??? PR ERCP STENT PLACEMENT BILIARY/PANCREATIC DUCT N/A 04/26/2017    Procedure: ENDOSCOPIC RETROGRADE CHOLANGIOPANCREATOGRAPHY (ERCP); WITH PLACEMENT OF ENDOSCOPIC STENT INTO BILIARY OR PANCREATIC DUCT;  Surgeon: Mayford Knife, MD;  Location: GI PROCEDURES MEMORIAL [facility];  Service: Gastroenterology   ??? PR ERCP,W/REMOVAL STONE,BIL/PANCR DUCTS N/A 11/29/2016    Procedure: ERCP; W/ENDOSCOPIC RETROGRADE REMOVAL OF CALCULUS/CALCULI FROM BILIARY &/OR PANCREATIC DUCTS;  Surgeon: Chriss Driver, MD;  Location: GI PROCEDURES MEMORIAL [facility];  Service: Gastroenterology   ??? PR ERCP,W/REMOVAL STONE,BIL/PANCR DUCTS N/A 02/10/2017    Procedure: ERCP; W/ENDOSCOPIC RETROGRADE REMOVAL OF CALCULUS/CALCULI FROM BILIARY &/OR PANCREATIC DUCTS;  Surgeon: Mayford Knife, MD;  Location: GI PROCEDURES MEMORIAL [facility];  Service: Gastroenterology   ??? PR ERCP,W/REMOVAL STONE,BIL/PANCR DUCTS N/A 04/26/2017    Procedure: ERCP; W/ENDOSCOPIC RETROGRADE REMOVAL OF CALCULUS/CALCULI FROM BILIARY &/OR PANCREATIC DUCTS;  Surgeon: Mayford Knife, MD;  Location: GI PROCEDURES MEMORIAL [facility];  Service: Gastroenterology   ??? PR ERCP,W/REMOVAL STONE,BIL/PANCR DUCTS N/A 06/29/2017    Procedure: ERCP; W/ENDOSCOPIC RETROGRADE REMOVAL OF CALCULUS/CALCULI FROM BILIARY &/OR PANCREATIC DUCTS;  Surgeon: Vonda Antigua, MD;  Location: GI PROCEDURES MEMORIAL [facility];  Service: Gastroenterology   ??? PR ESOPHAGOSCP RIG TRANSORAL HYPOPHARYNX CRV ESOPH N/A 04/04/2018    Procedure: ESOPHAGOSCOPY, RIGID, TRANSORAL WITH DIVERTICULECTOMY OF HYPOPHARYNX OR CERVICAL ESOPHAGUS, WITH CRICOPHARYNGEAL MYOTOMY, INCLUDES USE OF TELESCOPE OR OPERATING MICROSCOPE AND REPAIR, WHEN PERFORMED;  Surgeon: Vista Deck, MD;  Location: MAIN OR [facility];  Service: ENT   ??? PR INSER HEART TEMP PACER ONE CHMBR N/A 10/02/2016    Procedure: Tempoarary Pacemaker Insertion;  Surgeon: Meredith Leeds, MD;  Location: [facility];  Service: Cardiology   ??? PR LAP,DIAGNOSTIC ABDOMEN N/A 01/22/2016    Procedure:  Laparoscopy, Abdomen, Peritoneum, & Omentum, Diagnostic, W/Wo Collection Specimen(S) By Brushing Or Washing;  Surgeon: Particia Nearing, MD;  Location: MAIN OR [facility];  Service: Transplant   ??? PR LARYNGOPLASTY MEDIALIZATION UNLIATERAL N/A 11/07/2018    Procedure: R31 LARYNGOPLASTY, MEDIALIZATION, UNILATERAL;  Surgeon: Vista Deck, MD;  Location: MAIN OR [facility];  Service: ENT   ??? PR PLACE PERCUT GASTROSTOMY TUBE N/A 11/17/2016    Procedure: UGI ENDO; W/DIRECTED PLCMT PERQ GASTROSTOMY TUBE;  Surgeon: Cletis Athens, MD;  Location: GI PROCEDURES MEMORIAL [facility];  Service: Gastroenterology   ??? PR TRACHEOSTOMY, PLANNED N/A 09/29/2016    Procedure: TRACHEOSTOMY PLANNED (SEPART PROC);  Surgeon: Katherina Mires, MD;  Location: MAIN OR [facility];  Service: Trauma   ??? PR TRANSCATH INSERT OR REPLACE LEADLESS PM VENTR N/A 10/11/2016    Procedure: Pacemaker Implant/Replace Leadless;  Surgeon: Meredith Leeds, MD;  Location: [facility];  Service: Cardiology   ??? PR TRANSPLANT LIVER,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    Procedure: Liver Allotransplantation; Orthotopic, Partial Or Whole, From Cadaver Or Living Donor, Any Age;  Surgeon: Doyce Loose, MD;  Location: MAIN OR [facility];  Service: Transplant   ??? PR TRANSPLANT,PREP DONOR LIVER, WHOLE N/A 09/15/2016    Procedure: Rogelia Boga Std Prep Cad Donor Whole Liver Gft Prior Tnsplnt,Inc Chole,Diss/Rem Surr Tissu Wo Triseg/Lobe Splt;  Surgeon: Doyce Loose, MD;  Location: MAIN OR [facility];  Service: Transplant   ??? PR UPPER GI ENDOSCOPY,BIOPSY N/A 07/17/2012    Procedure: UGI ENDOSCOPY; WITH BIOPSY, SINGLE OR MULTIPLE;  Surgeon: Alba Destine, MD;  Location: GI PROCEDURES MEMORIAL [facility];  Service: Gastroenterology   ??? PR UPPER GI ENDOSCOPY,DIAGNOSIS N/A 02/04/2014    Procedure: UGI ENDO, INCLUDE ESOPHAGUS, STOMACH, & DUODENUM &/OR JEJUNUM; DX W/WO COLLECTION SPECIMN, BY BRUSH OR WASH;  Surgeon: Wilburt Finlay, MD;  Location: GI PROCEDURES MEMORIAL [facility];  Service: Gastroenterology   ??? PR UPPER GI ENDOSCOPY,LIGAT VARIX N/A 11/05/2013    Procedure: UGI ENDO; W/BAND LIG ESOPH &/OR GASTRIC VARICES;  Surgeon: Wilburt Finlay, MD;  Location: GI PROCEDURES MEMORIAL [facility];  Service: Gastroenterology        Objective:   Objective   Review of systems is significant for:     As stated in the HPI, remainder of 12 system review is negative.      No Known Allergies   Current Outpatient Medications   Medication Sig Dispense Refill   ??? bisacodyl (DULCOLAX, BISACODYL,) 5 mg EC tablet Take 5 mg by mouth daily as needed.      ??? blood-glucose meter (GLUCOSE MONITORING KIT) kit Use as instructed 1 each 0   ??? chlorthalidone (HYGROTON) 25 MG tablet Take 1/2 tablet (12.5 mg total) by mouth every morning. 30 tablet 0   ??? cholecalciferol, vitamin D3, 125 mcg (5,000 unit) tablet Take 125 mcg by mouth daily.     ??? cream base no.171, bulk, Crea Apply to affected area as needed up to 4 times daily 60 g 0   ??? cycloSPORINE modified (NEORAL) 100 MG capsule Take 2 capsules (200 mg total) in the morning AND 1 capsule (with 3 x 25mg  capsules) nightly. For a total dose of 200 mg in the morning , 175mg  every evening. 270 capsule 3   ??? cycloSPORINE modified (NEORAL) 25 MG capsule Take 3 capsules (75 mg total) by mouth nightly (with a 100mg  capsule). For a total dose of 200 mg in the morning , 175mg  every evening 270 capsule 3   ??? DULoxetine (CYMBALTA) 30 MG capsule  Take 2 capsules (60 mg total) by mouth nightly. 60 capsule 11   ??? magnesium oxide-Mg AA chelate (MAGNESIUM, AMINO ACID CHELATE,) 133 mg Tab Take 2 tablets by mouth Two (2) times a day. 120 tablet PRN   ??? melatonin 3 mg Tab Take 1 tablet (3 mg total) by mouth nightly. 30 tablet 0   ??? mirtazapine (REMERON) 15 MG tablet Take 1 tablet (15 mg total) by mouth nightly. 90 tablet 3   ??? mycophenolate (CELLCEPT) 250 mg capsule Take 2 capsules (500 mg total) by mouth two (2) times a day. 120 capsule 7   ??? predniSONE (DELTASONE) 5 MG tablet Take 4 tablets (20 mg) by mouth for one day, then take 3 tablets (15 mg) the next day, then take 2 tablets (10 mg) the next day, then take 1 tablet (5mg ) then stop 10 tablet 0   ??? pregabalin (LYRICA) 75 MG capsule Take 1 capsule (75 mg total) by mouth Two (2) times a day. 60 capsule 6   ??? rosuvastatin (CRESTOR) 5 MG tablet Take 1 tablet (5 mg total) by mouth daily. 90 tablet 1   ??? semaglutide (OZEMPIC) 1 mg/dose (4 mg/3 mL) PnIj injection Inject 1 mg under the skin every seven (7) days. 9 mL 3   ??? sildenafiL, pulm.hypertension, (REVATIO) 20 mg tablet Take 2 tablets (40 mg total) by mouth Three (3) times a day. 180 tablet 2   ??? sodium bicarbonate 650 mg tablet Take 1 tablet (650 mg total) by mouth two (2) times a day. 180 tablet 3   ??? blood sugar diagnostic Strp Use three (3) times a day before meals. 100 each 7   ??? pen needle, diabetic (ULTICARE PEN NEEDLE) 32 gauge x 5/32 (4 mm) Ndle USE WITH INSULIN PEN 4 TIMES DAILY AS DIRECTED 100 each 1     No current facility-administered medications for this visit.        Physical Exam:    weight is 70.8 kg (156 lb). His temporal temperature is 36.7 ??C (98.1 ??F). His blood pressure is 126/74 and his pulse is 81. His oxygen saturation is 99%.   General Appearance:    Alert, cooperative, no distress, appears stated age   Head:    Normocephalic, without obvious abnormality, atraumatic   Eyes:    PERRL, conjunctiva clear, EOM's intact    Oropharynx:   No lesions   Neck:   Supple, trachea midline, no adenopathy;    No JVD   Lungs:     Good breath sounds b/l, no crackles or wheezes, respirations unlabored   Chest Wall:    No tenderness or deformity    Heart:    Regular rate and rhythm, S1 and S2 normal, no murmur, rub   or gallop   Abdomen:     Soft, non-tender, normal bowel sounds, no masses, no organomegaly.  + G tube in place   Extremities:   Extremities normal, no cyanosis, clubbing, or edema   Pulses:   2+ and symmetric all extremities   Skin:   No rashes or lesions   Lymph nodes:   No cervical or supraclavicular adenopathy   Neurologic:   No focal neurological deficits       Diagnostic Review:    Echo 04/21/17:    Left Ventricle The left ventricle is normal in size with normal wall thickness.   The left ventricular systolic function is normal.   The ejection fraction is visually estimated at 65 to 70%.   There is  impaired left ventricular relaxation with normal left ventricular filling pressure consistent with grade I diastolic dysfunction.   Mitral Valve The mitral valve leaflets are normal, with normal leaflet mobility.   There is no significant mitral regurgitation by color and continuous wave Doppler imaging.   Left Atrium The left atrium is dilated.   Aortic Valve The aortic valve is poorly visualized with probably normal excursion.   There is no significant aortic regurgitation by color and continuous wave Doppler imaging.  There is no evidence of a significant transvalvular gradient.   - Peak transvalvular velocity: 1 m/sec   Ascending Aorta The aorta is normal in size in the visualized segments.   Pulmonary Artery The pulmonary artery is not well visualized.   Pulmonic Valve The pulmonic valve is poorly visualized, but probably normal.   There is no significant pulmonic regurgitation by color and continuous wave Doppler imaging.   There is no evidence of a significant transvalvular gradient.   - Peak transvalvular velocity: 0.9 m/sec   Right Ventricle The right ventricle is normal in size with normal wall thickness.   The right ventricular systolic function is normal.   Tricuspid Valve The tricuspid valve leaflets are normal, with normal leaflet mobility.   There is trivial tricuspid regurgitation by color and continuous wave Doppler imaging.   - The Doppler signal is suboptimal and pulmonary artery systolic pressure cannot be accurately estimated.   Right Atrium The right atrium is normal in size.   IVC/SVC The IVC diameter is <=21 mm with >50% decrease in size with inspiration suggesting normal right atrial pressure (0-5 mm Hg).   Pericardium There is no evidence of a significant pericardial effusion.         V/Q scan 01/11/17:    Impression     Normal ventilation and perfusion scan.         CT scan PE protocol performed on 09/2016 post op revealed:    Impression     No CT evidence for acute pulmonary embolism, as clinically questioned.    Moderate bilateral pleural effusions similar to prior CT dated 01/30/2016 with slightly increased atelectasis of the adjacent lung parenchyma in the bilateral lower lobes, left greater than right.    Addendum (Dr.Altun dictating 09/27/2016 08:35 AM) Focal contour bulging of the liver adjacent to the diaphragm and right kidney is noted which is not well evaluated due to the presence of streak artifacts.          Pulmonary Function Test performed on 11/24/15 show:            Consistent with normal spirometry and normal DLCO    6 Minute Walk Test performed on 01/06/17:         Echo performed on 09/27/16:    Echocardiographic Findings     Left Ventricle The left ventricle is normal in size with normal wall thickness.   The left ventricular systolic function is normal.   The ejection fraction is visually estimated at 60 to 65%.   Left ventricular diastolic function cannot be accurately assessed.      Left Atrium The left atrium is normal in size.      Right Ventricle The right ventricle is moderately dilated.   The right ventricular systolic function is decreased.      Tricuspid Valve The tricuspid valve is not well visualized.   There is mild tricuspid regurgitation by color and continuous wave Doppler imaging.   The regurgitation jet is directed toward the septum.  The pulmonary artery systolic pressure is severely elevated.   - Peak tricuspid regurgitant velocity: 4.6 m/sec  - Estimated pulmonary artery systolic pressure: 80 + RA pressure mmHg      Right Atrium The right atrium is poorly visualized but appears dilated      IVC/SVC Mechanical ventilation precludes the ability to accurately assess right atrial pressure.      Pericardium There is a small anterior pericardial effusion.          [facility] ICU Catheterization performed on date 09/22/16:     Wedge pressure initially looks to be 15-16 but with further balloon deflation this clearly decreases further to 8 mmHg with very good quality tracing. At that time mPAP 50, CVP 9, CO by CCO measurements 4.1/CI 2.2, PVR approximating 10 WU.

## 2020-08-10 ENCOUNTER — Emergency Department: Payer: Medicare Other

## 2020-08-10 ENCOUNTER — Inpatient Hospital Stay
Admission: EM | Admit: 2020-08-10 | Discharge: 2020-08-20 | DRG: 637 | Disposition: A | Discharge status: Home Health | Payer: Medicare Other | Attending: Internal Medicine | Admitting: Internal Medicine

## 2020-08-10 ENCOUNTER — Encounter: Payer: Self-pay | Admitting: Emergency Medicine

## 2020-08-10 DIAGNOSIS — G9341 Metabolic encephalopathy: Secondary | ICD-10-CM | POA: Diagnosis present

## 2020-08-10 DIAGNOSIS — K2100 Gastro-esophageal reflux disease with esophagitis, without bleeding: Secondary | ICD-10-CM | POA: Diagnosis present

## 2020-08-10 DIAGNOSIS — K209 Esophagitis, unspecified without bleeding: Secondary | ICD-10-CM

## 2020-08-10 DIAGNOSIS — E11649 Type 2 diabetes mellitus with hypoglycemia without coma: Secondary | ICD-10-CM | POA: Diagnosis present

## 2020-08-10 DIAGNOSIS — N400 Enlarged prostate without lower urinary tract symptoms: Secondary | ICD-10-CM | POA: Diagnosis present

## 2020-08-10 DIAGNOSIS — R410 Disorientation, unspecified: Secondary | ICD-10-CM

## 2020-08-10 DIAGNOSIS — D696 Thrombocytopenia, unspecified: Secondary | ICD-10-CM | POA: Diagnosis present

## 2020-08-10 DIAGNOSIS — N184 Chronic kidney disease, stage 4 (severe): Secondary | ICD-10-CM | POA: Diagnosis present

## 2020-08-10 DIAGNOSIS — K319 Disease of stomach and duodenum, unspecified: Secondary | ICD-10-CM

## 2020-08-10 DIAGNOSIS — D631 Anemia in chronic kidney disease: Secondary | ICD-10-CM | POA: Diagnosis present

## 2020-08-10 DIAGNOSIS — K449 Diaphragmatic hernia without obstruction or gangrene: Secondary | ICD-10-CM

## 2020-08-10 DIAGNOSIS — E1100 Type 2 diabetes mellitus with hyperosmolarity without nonketotic hyperglycemic-hyperosmolar coma (NKHHC): Principal | ICD-10-CM

## 2020-08-10 DIAGNOSIS — Z7983 Long term (current) use of bisphosphonates: Secondary | ICD-10-CM

## 2020-08-10 DIAGNOSIS — K3189 Other diseases of stomach and duodenum: Secondary | ICD-10-CM | POA: Diagnosis present

## 2020-08-10 DIAGNOSIS — I5020 Unspecified systolic (congestive) heart failure: Secondary | ICD-10-CM | POA: Diagnosis present

## 2020-08-10 DIAGNOSIS — R1310 Dysphagia, unspecified: Secondary | ICD-10-CM

## 2020-08-10 DIAGNOSIS — I5082 Biventricular heart failure: Secondary | ICD-10-CM | POA: Diagnosis present

## 2020-08-10 DIAGNOSIS — I495 Sick sinus syndrome: Secondary | ICD-10-CM | POA: Diagnosis present

## 2020-08-10 DIAGNOSIS — E869 Volume depletion, unspecified: Secondary | ICD-10-CM | POA: Diagnosis present

## 2020-08-10 DIAGNOSIS — Z8619 Personal history of other infectious and parasitic diseases: Secondary | ICD-10-CM

## 2020-08-10 DIAGNOSIS — Z8505 Personal history of malignant neoplasm of liver: Secondary | ICD-10-CM

## 2020-08-10 DIAGNOSIS — Z79899 Other long term (current) drug therapy: Secondary | ICD-10-CM

## 2020-08-10 DIAGNOSIS — Z7901 Long term (current) use of anticoagulants: Secondary | ICD-10-CM

## 2020-08-10 DIAGNOSIS — I4891 Unspecified atrial fibrillation: Secondary | ICD-10-CM | POA: Diagnosis present

## 2020-08-10 DIAGNOSIS — Z950 Presence of cardiac pacemaker: Secondary | ICD-10-CM

## 2020-08-10 DIAGNOSIS — Z7984 Long term (current) use of oral hypoglycemic drugs: Secondary | ICD-10-CM

## 2020-08-10 DIAGNOSIS — Z944 Liver transplant status: Secondary | ICD-10-CM

## 2020-08-10 DIAGNOSIS — J9601 Acute respiratory failure with hypoxia: Secondary | ICD-10-CM | POA: Diagnosis present

## 2020-08-10 DIAGNOSIS — N183 Chronic kidney disease, stage 3 unspecified: Secondary | ICD-10-CM | POA: Diagnosis present

## 2020-08-10 DIAGNOSIS — E785 Hyperlipidemia, unspecified: Secondary | ICD-10-CM | POA: Diagnosis present

## 2020-08-10 DIAGNOSIS — E1122 Type 2 diabetes mellitus with diabetic chronic kidney disease: Secondary | ICD-10-CM | POA: Diagnosis present

## 2020-08-10 DIAGNOSIS — I429 Cardiomyopathy, unspecified: Secondary | ICD-10-CM | POA: Diagnosis present

## 2020-08-10 DIAGNOSIS — R490 Dysphonia: Secondary | ICD-10-CM | POA: Diagnosis present

## 2020-08-10 DIAGNOSIS — I5022 Chronic systolic (congestive) heart failure: Secondary | ICD-10-CM | POA: Diagnosis present

## 2020-08-10 DIAGNOSIS — E1165 Type 2 diabetes mellitus with hyperglycemia: Principal | ICD-10-CM | POA: Diagnosis present

## 2020-08-10 DIAGNOSIS — R54 Age-related physical debility: Secondary | ICD-10-CM | POA: Diagnosis present

## 2020-08-10 DIAGNOSIS — Z20822 Contact with and (suspected) exposure to covid-19: Secondary | ICD-10-CM | POA: Diagnosis present

## 2020-08-10 DIAGNOSIS — I2721 Secondary pulmonary arterial hypertension: Secondary | ICD-10-CM | POA: Diagnosis present

## 2020-08-10 DIAGNOSIS — I480 Paroxysmal atrial fibrillation: Secondary | ICD-10-CM | POA: Diagnosis present

## 2020-08-10 DIAGNOSIS — Z794 Long term (current) use of insulin: Secondary | ICD-10-CM

## 2020-08-10 LAB — COMPREHENSIVE METABOLIC PANEL
ALT: 13 U/L (ref 0–44)
AST: 12 U/L — ABNORMAL LOW (ref 15–41)
Albumin: 3.8 g/dL (ref 3.5–5.0)
Alkaline Phosphatase: 88 U/L (ref 38–126)
Anion gap: 16 — ABNORMAL HIGH (ref 5–15)
BUN: 39 mg/dL — ABNORMAL HIGH (ref 8–23)
CO2: 25 mmol/L (ref 22–32)
Calcium: 9.7 mg/dL (ref 8.9–10.3)
Chloride: 83 mmol/L — ABNORMAL LOW (ref 98–111)
Creatinine, Ser: 2.2 mg/dL — ABNORMAL HIGH (ref 0.61–1.24)
GFR, Estimated: 31 mL/min — ABNORMAL LOW (ref >60)
Glucose, Bld: 859 mg/dL (ref 70–99)
Potassium: 5.3 mmol/L — ABNORMAL HIGH (ref 3.5–5.1)
Sodium: 124 mmol/L — ABNORMAL LOW (ref 135–145)
Total Bilirubin: 1 mg/dL (ref 0.3–1.2)
Total Protein: 6.9 g/dL (ref 6.5–8.1)

## 2020-08-10 LAB — CBC WITH DIFFERENTIAL/PLATELET
Abs Immature Granulocytes: 0.03 10*3/uL (ref 0.00–0.07)
Basophils Absolute: 0 10*3/uL (ref 0.0–0.1)
Basophils Relative: 1 %
Eosinophils Absolute: 0.1 10*3/uL (ref 0.0–0.5)
Eosinophils Relative: 1 %
HCT: 35.7 % — ABNORMAL LOW (ref 39.0–52.0)
Hemoglobin: 11.9 g/dL — ABNORMAL LOW (ref 13.0–17.0)
Immature Granulocytes: 1 %
Lymphocytes Relative: 17 %
Lymphs Abs: 0.8 10*3/uL (ref 0.7–4.0)
MCH: 30.6 pg (ref 26.0–34.0)
MCHC: 33.3 g/dL (ref 30.0–36.0)
MCV: 91.8 fL (ref 80.0–100.0)
Monocytes Absolute: 0.5 10*3/uL (ref 0.1–1.0)
Monocytes Relative: 10 %
Neutro Abs: 3.6 10*3/uL (ref 1.7–7.7)
Neutrophils Relative %: 70 %
Platelets: 127 10*3/uL — ABNORMAL LOW (ref 150–400)
RBC: 3.89 MIL/uL — ABNORMAL LOW (ref 4.22–5.81)
RDW: 14.3 % (ref 11.5–15.5)
WBC: 5 10*3/uL (ref 4.0–10.5)
nRBC: 0 % (ref 0.0–0.2)

## 2020-08-10 LAB — BLOOD GAS, VENOUS
Acid-Base Excess: 4.5 mmol/L — ABNORMAL HIGH (ref 0.0–2.0)
Bicarbonate: 29.8 mmol/L — ABNORMAL HIGH (ref 20.0–28.0)
O2 Saturation: 44.2 %
Patient temperature: 37
pCO2, Ven: 46 mmHg (ref 44.0–60.0)
pH, Ven: 7.42 (ref 7.250–7.430)
pO2, Ven: <31 mmHg — CL (ref 32.0–45.0)

## 2020-08-10 LAB — OSMOLALITY: Osmolality: 323 mOsm/kg (ref 275–295)

## 2020-08-10 LAB — BETA-HYDROXYBUTYRIC ACID: Beta-Hydroxybutyric Acid: 3.72 mmol/L — ABNORMAL HIGH (ref 0.05–0.27)

## 2020-08-10 MED ORDER — DEXTROSE 50 % IV SOLN
0.0000 mL | INTRAVENOUS | Status: DC | PRN
  Administered 2020-08-10: 50 mL via INTRAVENOUS
  Filled 2020-08-10: qty 50

## 2020-08-10 MED ORDER — DROPERIDOL 2.5 MG/ML IJ SOLN
2.5000 mg | Freq: Once | INTRAMUSCULAR | Status: AC
  Administered 2020-08-10: 2.5 mg via INTRAVENOUS
  Filled 2020-08-10: qty 2

## 2020-08-10 MED ORDER — DROPERIDOL 2.5 MG/ML IJ SOLN
2.5000 mg | Freq: Once | INTRAMUSCULAR | Status: AC
  Administered 2020-08-11: 2.5 mg via INTRAVENOUS

## 2020-08-10 MED ORDER — INSULIN REGULAR(HUMAN) IN NACL 100-0.9 UT/100ML-% IV SOLN
INTRAVENOUS | Status: DC
  Administered 2020-08-10: 10.5 [IU]/h via INTRAVENOUS
  Filled 2020-08-10: qty 100

## 2020-08-10 MED ORDER — LACTATED RINGERS IV BOLUS: 1000.0000 mL | Freq: Once | INTRAVENOUS | Status: DC

## 2020-08-10 MED ORDER — LACTATED RINGERS IV SOLN: INTRAVENOUS | Status: DC

## 2020-08-10 MED ORDER — LACTATED RINGERS IV BOLUS
1000.0000 mL | Freq: Once | INTRAVENOUS | Status: AC
  Administered 2020-08-10: 1000 mL via INTRAVENOUS

## 2020-08-10 MED ORDER — DEXTROSE IN LACTATED RINGERS 5 % IV SOLN: INTRAVENOUS | Status: DC

## 2020-08-10 NOTE — ED Triage Notes (Signed)
Ems states the pt was unresponsve, pt is a diabetic, glucometer  Read hi for the  Ems . Pt is AAOX1 @ this time

## 2020-08-10 NOTE — ED Provider Notes (Signed)
[facility] Provider Note  ____________________________________________   Event Date/Time   First MD Initiated Contact with Patient 08/10/20 2259     (approximate)  I have reviewed the triage vital signs and the nursing notes.   HISTORY  Chief Complaint Altered Mental Status and Hyperglycemia  Level 5 caveat:  history/ROS limited by acute/critical illness  HPI Hector Mueller is a 70 y.o. male with medical history as listed below which notably includes an admission for HHS that required intubation due to extreme encephalopathy/hyperglycemia induced coma.  He presents tonight for decreased level of responsiveness similar to his last admission.  Family reportedly stated that they did not think that he had been taking his diabetes medicine for some time.  The patient is not able to provide any history.  He is awake and looking around, focuses briefly  on staff members as they interact with him, but is not speaking, following commands, or answering questions.        Past Medical History:  Diagnosis Date  . Atrial fibrillation ([place])   . BPH (benign prostatic hyperplasia)   . Cancer ([place])    liver  . CHF (congestive heart failure) ([place])   . Chronic kidney disease   . Cirrhosis ([place])   . Diabetes mellitus without complication ([place])   . GERD (gastroesophageal reflux disease)   . Hepatitis C   . Hx of liver transplant ([place])   . Pulmonary HTN [facility])     Patient Active Problem List   Diagnosis Date Noted  . Type 2 diabetes mellitus with hyperosmolar hyperglycemic state (HHS) ([place]) 08/11/2020  . HFrEF (heart failure with reduced ejection fraction) ([place])   . Atrial fibrillation ([place])   . Acute respiratory failure ([place]) 07/11/2020  . Hip fracture ([place]) 11/27/2017    Past Surgical History:  Procedure Laterality Date  . ANKLE FRACTURE SURGERY Bilateral   . BACK SURGERY    . CLAVICLE SURGERY    . INTRAMEDULLARY (IM) NAIL  INTERTROCHANTERIC Right 11/28/2017   Procedure: INTRAMEDULLARY (IM) NAIL INTERTROCHANTRIC;  Surgeon: Earnestine Leys, MD;  Location: [facility] ORS;  Service: Orthopedics;  Laterality: Right;  . LIVER SURGERY      Prior to Admission medications   Medication Sig Start Date End Date Taking? Authorizing Provider  alendronate (FOSAMAX) 70 MG tablet Take 70 mg by mouth once a week. Take with a full glass of water on an empty stomach.    [provider]  amiodarone (PACERONE) 200 MG tablet Take 1 tablet (200 mg total) by mouth 2 (two) times daily for 7 days, THEN 1 tablet (200 mg total) daily. 07/21/20 08/27/20  Sidney Ace, MD  Cholecalciferol (VITAMIN D3) 125 MCG (5000 UT) CAPS Take 3,000 Units by mouth daily.    [provider]  cycloSPORINE modified (NEORAL) 100 MG capsule Take 200 mg by mouth daily.    [provider]  cycloSPORINE modified (NEORAL) 25 MG capsule Take 175 mg by mouth every evening.    [provider]  DULoxetine (CYMBALTA) 30 MG capsule Take 30 mg by mouth daily. 05/22/20   [provider]  Insulin Glargine (BASAGLAR KWIKPEN) 100 UNIT/ML Inject 12 Units into the skin daily as needed. Patient not taking: Reported on 07/29/2020    [provider]  Melatonin 3 MG TABS Take 3 mg by mouth. 02/06/17   [provider]  metoprolol succinate (TOPROL-XL) 25 MG 24 hr tablet Take 1 tablet (25 mg total) by mouth 2 (two) times daily.  07/20/20 08/19/20  Sidney Ace, MD  mirtazapine (REMERON) 15 MG tablet Take 15 mg by mouth at bedtime.  10/18/17   [provider]  mycophenolate (CELLCEPT) 250 MG capsule Take 500 mg by mouth in the morning and at bedtime. 04/14/20 04/14/21  [provider]  omeprazole (PRILOSEC) 20 MG capsule Take 20 mg by mouth daily.    [provider]  ondansetron (ZOFRAN) 4 MG tablet Take 1 tablet (4 mg total) by mouth every 6 (six) hours as needed for nausea. 12/01/17   Vaughan Basta,  MD  OZEMPIC, 0.25 OR 0.5 MG/DOSE, 2 MG/1.5ML SOPN Inject 0.5 mg into the skin once a week. 05/04/20   [provider]  OZEMPIC, 1 MG/DOSE, 4 MG/3ML SOPN Inject 1 mg into the skin once a week. Patient not taking: Reported on 07/29/2020 05/19/20   [provider]  rosuvastatin (CRESTOR) 5 MG tablet Take 5 mg by mouth daily. 06/09/20   [provider]  sildenafil (REVATIO) 20 MG tablet Take 40 mg by mouth 3 (three) times daily.  10/17/17   [provider]  sodium bicarbonate 650 MG tablet Take 650 mg by mouth in the morning and at bedtime. 03/18/20 03/18/21  [provider]  Specialty Vitamins Products (MAGNESIUM, AMINO ACID CHELATE,) 133 MG tablet Take 2 tablets by mouth 2 (two) times daily. 03/02/17   [provider]  Tacrine Hydrochloride (COGNEX PO) Take 2 capsules by mouth daily at 6 (six) AM. With a glass of water    [provider]  warfarin (COUMADIN) 2.5 MG tablet Take 1 tablet (2.5 mg total) by mouth daily. 07/20/20 07/20/21  Sidney Ace, MD    Allergies Patient has no known allergies.  History reviewed. No pertinent family history.  Social History Social History   Tobacco Use  . Smoking status: Never Smoker  . Smokeless tobacco: Never Used  Vaping Use  . Vaping Use: Never used  Substance Use Topics  . Alcohol use: No  . Drug use: Never    Review of Systems Level 5 caveat:  history/ROS limited by acute/critical illness   ____________________________________________   PHYSICAL EXAM:  ED Triage Vitals  Enc Vitals Group     BP 08/11/20 0030 133/79     Pulse Rate 08/10/20 2249 92     Resp 08/11/20 0030 (!) 22     Temp --      Temp Source 08/10/20 2249 Oral     SpO2 08/10/20 2248 98 %     Weight 08/10/20 2312 81 kg (178 lb 9.2 oz)     Height 08/10/20 2312 1.753 m (5\' 9" )     Head Circumference --      Peak Flow --      Pain Score --      Pain Loc --      Pain Edu? --      Excl. in [place]? --      Constitutional: Awake and alert but not responding to commands.  He will focus if someone says his name to him but otherwise is not responding, just blinking his eyes and looking around. Eyes: Conjunctivae are normal.  Head: Atraumatic. Nose: No congestion/rhinnorhea. Mouth/Throat: Dry mucous membranes. Neck: No stridor.  No meningeal signs.   Cardiovascular: Normal rate, regular rhythm. Good peripheral circulation. Respiratory: Normal respiratory effort.  No retractions. Gastrointestinal: Soft and nontender. No distention.  Musculoskeletal: No lower extremity tenderness nor edema. No gross deformities of extremities. Neurologic: Patient is not speaking.  He  is moving all 4 extremities and has no facial droop but is unable to participate in neurological exam. Skin:  Skin is warm, dry and intact. Psychiatric: Patient is clearly altered, abnormal mood and affect.  ____________________________________________   LABS (all labs ordered are listed, but only abnormal results are displayed)  Labs Reviewed  CBC WITH DIFFERENTIAL/PLATELET - Abnormal; Notable for the following components:      Result Value   RBC 3.89 (*)    Hemoglobin 11.9 (*)    HCT 35.7 (*)    Platelets 127 (*)    All other components within normal limits  COMPREHENSIVE METABOLIC PANEL - Abnormal; Notable for the following components:   Sodium 124 (*)    Potassium 5.3 (*)    Chloride 83 (*)    Glucose, Bld 859 (*)    BUN 39 (*)    Creatinine, Ser 2.20 (*)    AST 12 (*)    GFR, Estimated 31 (*)    Anion gap 16 (*)    All other components within normal limits  BLOOD GAS, VENOUS - Abnormal; Notable for the following components:   pO2, Ven <31.0 (*)    Bicarbonate 29.8 (*)    Acid-Base Excess 4.5 (*)    All other components within normal limits  BETA-HYDROXYBUTYRIC ACID - Abnormal; Notable for the following components:   Beta-Hydroxybutyric Acid 3.72 (*)    All other components within normal limits  OSMOLALITY  - Abnormal; Notable for the following components:   Osmolality 323 (*)    All other components within normal limits  CBG MONITORING, ED - Abnormal; Notable for the following components:   Glucose-Capillary >600 (*)    All other components within normal limits  CBG MONITORING, ED - Abnormal; Notable for the following components:   Glucose-Capillary 543 (*)    All other components within normal limits  RESP PANEL BY RT-PCR (FLU A&B, COVID) ARPGX2  MRSA PCR SCREENING  URINALYSIS, COMPLETE (UACMP) WITH MICROSCOPIC   ____________________________________________  EKG  ED ECG REPORT I, Hinda Kehr, the attending physician, personally viewed and interpreted this ECG.  Date: 08/10/2020 EKG Time: 22: 52 Rate: 91 Rhythm: normal sinus rhythm QRS Axis: normal Intervals: normal ST/T Wave abnormalities: Nearly global mild ST segment elevation most notable in inferior leads but also notable laterally.  However no significant change from prior EKG on record from about a month ago. Narrative Interpretation: Nearly global ST segment elevation but does not meet STEMI criteria and is similar to prior EKG, apparently the patient's baseline.   ____________________________________________  RADIOLOGY I, Hinda Kehr, personally viewed and evaluated these images (plain radiographs) as part of my medical decision making, as well as reviewing the written report by the radiologist.  ED MD interpretation: No abnormality on chest x-ray  Official radiology report(s): DG Chest Portable 1 View  Result Date: 08/10/2020 CLINICAL DATA:  Altered mental status EXAM: PORTABLE CHEST 1 VIEW COMPARISON:  07/15/2020 FINDINGS: The heart size and mediastinal contours are within normal limits. Aortic atherosclerosis. Low lung volumes. Surgical hardware in the lower spine both lungs are clear. The visualized skeletal structures are unremarkable. IMPRESSION: No active disease. Electronically Signed   By: Donavan Foil M.D.    On: 08/10/2020 23:17    ____________________________________________   PROCEDURES   Procedure(s) performed (including Critical Care):  .Critical Care Performed by: Hinda Kehr, MD Authorized by: Hinda Kehr, MD   Critical care provider statement:    Critical care time (minutes):  45   Critical care time  was exclusive of:  Separately billable procedures and treating other patients   Critical care was necessary to treat or prevent imminent or life-threatening deterioration of the following conditions:  Metabolic crisis   Critical care was time spent personally by me on the following activities:  Development of treatment plan with patient or surrogate, discussions with consultants, evaluation of patient's response to treatment, examination of patient, obtaining history from patient or surrogate, ordering and performing treatments and interventions, ordering and review of laboratory studies, ordering and review of radiographic studies, pulse oximetry, re-evaluation of patient's condition and review of old charts .1-3 Lead EKG Interpretation Performed by: Hinda Kehr, MD Authorized by: Hinda Kehr, MD     Interpretation: normal     ECG rate:  90   ECG rate assessment: normal     Rhythm: sinus rhythm     Ectopy: none     Conduction: normal       ____________________________________________   INITIAL IMPRESSION / MDM / ASSESSMENT AND PLAN / ED COURSE  As part of my medical decision making, I reviewed the following data within the [place] notes reviewed and incorporated, Labs reviewed , EKG interpreted , Old chart reviewed, Discussed with admitting physician  and Notes from prior ED visits   Differential diagnosis includes, but is not limited to, HHS, DKA, CVA, other metabolic abnormality, acute infection.  Patient's presentation is very similar to a month ago although he is less altered than he was at that time since he required intubation.   He is currently protecting his airway but he is altered and is requiring frequent redirection.  Basic blood sugar reads too high to measure.  1 L lactated Ringer's has been started and standard lab work is pending.  No traumatic history, no indication for head CT right now given a very probable alternative diagnosis.  Vitals are within normal limits other than some very mild tachypnea but this seems to be secondary to agitation  The patient is on the cardiac monitor to evaluate for evidence of arrhythmia and/or significant heart rate changes.     Clinical Course as of 08/11/20 0122  Mon Aug 10, 2020  2338 This further work-up is consistent with HHS:  Based on his altered mental status, lack of acidosis based on his comprehensive metabolic panel and VBG, glucose of 859, only very slightly elevated anion gap.  Beta hydroxybutyric acid is still pending.  I ordered HHS protocol for insulin infusion.  Potassium is 5.3 so he does not require supplemental potassium currently.  I ordered a second liter of LR in addition to the first liter that was already going and then all the rest of the protocol for HHS.  Respiratory viral panel is pending. [CF]  2339 Patient is agitated.  I ordered droperidol 2.5 mg IV to help as a calming agent.  I consulted the hospitalist for admission. [CF]  2341 CBC with Differential(!) CBC within normal limits. [CF]  2357 Patient remains agitated and is requiring soft mitts and constant redirection at the bedside.  Ordered another droperidol 2.5 mg IV [CF]  2358 Beta-Hydroxybutyric Acid(!): 3.72 Elevated beta hydroxybutyric acid suggesting DKA, but proceeding with current plan with IV insulin per HHS protocol and will defer to the hospitalist for any adjustments to dosing. [CF]  Tue Aug 11, 2020  0027 Discussed by phone with Dr. Zada Finders with the hospitalist service who will admit. [CF]    Clinical Course User Index [CF] Hinda Kehr, MD  ____________________________________________  FINAL CLINICAL IMPRESSION(S) / ED DIAGNOSES  Final diagnoses:  Type 2 diabetes mellitus with hyperosmolar hyperglycemic state (HHS) ([place])  Delirium     MEDICATIONS GIVEN DURING THIS VISIT:  Medications  lactated ringers bolus 1,000 mL (has no administration in time range)  insulin regular, human (MYXREDLIN) 100 units/ 100 mL infusion (10.5 Units/hr Intravenous New Bag/Given 08/10/20 2356)  lactated ringers infusion (has no administration in time range)  dextrose 5 % in lactated ringers infusion (has no administration in time range)  dextrose 50 % solution 0-50 mL (50 mLs Intravenous Not Given 08/10/20 2354)  Chlorhexidine Gluconate Cloth 2 % PADS 6 each (has no administration in time range)  lactated ringers bolus 1,000 mL (1,000 mLs Intravenous New Bag/Given 08/10/20 2255)  droperidol (INAPSINE) 2.5 MG/ML injection 2.5 mg (2.5 mg Intravenous Given 08/10/20 2348)  droperidol (INAPSINE) 2.5 MG/ML injection 2.5 mg (2.5 mg Intravenous Given 08/11/20 0017)     ED Discharge Orders    None       Note:  This document was prepared using Dragon voice recognition software and may include unintentional dictation errors.   Hinda Kehr, MD 08/11/20 [phone]

## 2020-08-10 NOTE — ED Provider Notes (Signed)
Emergency Medicine Provider Triage Evaluation Note  Hector Mueller , a 70 y.o. male  was evaluated in triage.  Patient presenting via EMS with altered mental status since 5 PM this evening.  He was noted to be somnolent with EMS and blood sugar read "high."  He was recently admitted to the hospital for encephalopathy and HHS, required intubation at that time..  Review of Systems  Unable to obtain secondary to altered mental status  Physical Exam  There were no vitals taken for this visit. Gen:   Awake and agitated, oriented only to person. Resp:  Normal effort  MSK:   Moves extremities without difficulty  Medical Decision Making  Medically screening exam initiated at 10:45 PM.  Appropriate orders placed.  Hector Mueller was informed that the remainder of the evaluation will be completed by another provider, this initial triage assessment does not replace that evaluation, and the importance of remaining in the ED until their evaluation is complete.  70 year old male presents to the ED with altered mental status and high blood glucose level, recently admitted for HHS.  We will initiate DKA and altered mental status work-up, assess for infectious process with chest x-ray and UA.    Hector Divine, MD 08/10/20 2250

## 2020-08-11 ENCOUNTER — Inpatient Hospital Stay: Payer: Medicare Other

## 2020-08-11 DIAGNOSIS — Z944 Liver transplant status: Secondary | ICD-10-CM | POA: Diagnosis not present

## 2020-08-11 DIAGNOSIS — K449 Diaphragmatic hernia without obstruction or gangrene: Secondary | ICD-10-CM | POA: Diagnosis present

## 2020-08-11 DIAGNOSIS — I480 Paroxysmal atrial fibrillation: Secondary | ICD-10-CM | POA: Diagnosis present

## 2020-08-11 DIAGNOSIS — E1165 Type 2 diabetes mellitus with hyperglycemia: Secondary | ICD-10-CM | POA: Diagnosis not present

## 2020-08-11 DIAGNOSIS — D631 Anemia in chronic kidney disease: Secondary | ICD-10-CM | POA: Diagnosis present

## 2020-08-11 DIAGNOSIS — N1832 Chronic kidney disease, stage 3b: Secondary | ICD-10-CM

## 2020-08-11 DIAGNOSIS — I2721 Secondary pulmonary arterial hypertension: Secondary | ICD-10-CM | POA: Diagnosis present

## 2020-08-11 DIAGNOSIS — J3800 Paralysis of vocal cords and larynx, unspecified: Secondary | ICD-10-CM | POA: Diagnosis not present

## 2020-08-11 DIAGNOSIS — R1310 Dysphagia, unspecified: Secondary | ICD-10-CM | POA: Diagnosis present

## 2020-08-11 DIAGNOSIS — Z7984 Long term (current) use of oral hypoglycemic drugs: Secondary | ICD-10-CM | POA: Diagnosis not present

## 2020-08-11 DIAGNOSIS — Z7901 Long term (current) use of anticoagulants: Secondary | ICD-10-CM | POA: Diagnosis not present

## 2020-08-11 DIAGNOSIS — N184 Chronic kidney disease, stage 4 (severe): Secondary | ICD-10-CM | POA: Diagnosis present

## 2020-08-11 DIAGNOSIS — K209 Esophagitis, unspecified without bleeding: Secondary | ICD-10-CM | POA: Diagnosis not present

## 2020-08-11 DIAGNOSIS — R791 Abnormal coagulation profile: Secondary | ICD-10-CM | POA: Diagnosis not present

## 2020-08-11 DIAGNOSIS — I4891 Unspecified atrial fibrillation: Secondary | ICD-10-CM | POA: Diagnosis not present

## 2020-08-11 DIAGNOSIS — R1319 Other dysphagia: Secondary | ICD-10-CM | POA: Diagnosis not present

## 2020-08-11 DIAGNOSIS — Z8505 Personal history of malignant neoplasm of liver: Secondary | ICD-10-CM | POA: Diagnosis not present

## 2020-08-11 DIAGNOSIS — I5082 Biventricular heart failure: Secondary | ICD-10-CM | POA: Diagnosis present

## 2020-08-11 DIAGNOSIS — D696 Thrombocytopenia, unspecified: Secondary | ICD-10-CM | POA: Diagnosis present

## 2020-08-11 DIAGNOSIS — Z20822 Contact with and (suspected) exposure to covid-19: Secondary | ICD-10-CM | POA: Diagnosis present

## 2020-08-11 DIAGNOSIS — E1122 Type 2 diabetes mellitus with diabetic chronic kidney disease: Secondary | ICD-10-CM | POA: Diagnosis present

## 2020-08-11 DIAGNOSIS — G9341 Metabolic encephalopathy: Secondary | ICD-10-CM | POA: Diagnosis present

## 2020-08-11 DIAGNOSIS — E1100 Type 2 diabetes mellitus with hyperosmolarity without nonketotic hyperglycemic-hyperosmolar coma (NKHHC): Secondary | ICD-10-CM | POA: Diagnosis present

## 2020-08-11 DIAGNOSIS — Z794 Long term (current) use of insulin: Secondary | ICD-10-CM | POA: Diagnosis not present

## 2020-08-11 DIAGNOSIS — I429 Cardiomyopathy, unspecified: Secondary | ICD-10-CM | POA: Diagnosis present

## 2020-08-11 DIAGNOSIS — I5022 Chronic systolic (congestive) heart failure: Secondary | ICD-10-CM | POA: Diagnosis present

## 2020-08-11 DIAGNOSIS — N400 Enlarged prostate without lower urinary tract symptoms: Secondary | ICD-10-CM | POA: Diagnosis present

## 2020-08-11 DIAGNOSIS — N183 Chronic kidney disease, stage 3 unspecified: Secondary | ICD-10-CM | POA: Diagnosis present

## 2020-08-11 DIAGNOSIS — R410 Disorientation, unspecified: Secondary | ICD-10-CM | POA: Diagnosis present

## 2020-08-11 DIAGNOSIS — J9601 Acute respiratory failure with hypoxia: Secondary | ICD-10-CM | POA: Diagnosis present

## 2020-08-11 DIAGNOSIS — K319 Disease of stomach and duodenum, unspecified: Secondary | ICD-10-CM | POA: Diagnosis not present

## 2020-08-11 DIAGNOSIS — Z8619 Personal history of other infectious and parasitic diseases: Secondary | ICD-10-CM | POA: Diagnosis not present

## 2020-08-11 DIAGNOSIS — Z01810 Encounter for preprocedural cardiovascular examination: Secondary | ICD-10-CM | POA: Diagnosis not present

## 2020-08-11 DIAGNOSIS — K2100 Gastro-esophageal reflux disease with esophagitis, without bleeding: Secondary | ICD-10-CM | POA: Diagnosis present

## 2020-08-11 DIAGNOSIS — E11649 Type 2 diabetes mellitus with hypoglycemia without coma: Secondary | ICD-10-CM | POA: Diagnosis present

## 2020-08-11 LAB — BASIC METABOLIC PANEL
Anion gap: 14 (ref 5–15)
Anion gap: 14 (ref 5–15)
BUN: 34 mg/dL — ABNORMAL HIGH (ref 8–23)
BUN: 30 mg/dL — ABNORMAL HIGH (ref 8–23)
CO2: 27 mmol/L (ref 22–32)
CO2: 26 mmol/L (ref 22–32)
Calcium: 10.2 mg/dL (ref 8.9–10.3)
Calcium: 9.7 mg/dL (ref 8.9–10.3)
Chloride: 94 mmol/L — ABNORMAL LOW (ref 98–111)
Chloride: 94 mmol/L — ABNORMAL LOW (ref 98–111)
Creatinine, Ser: 2.08 mg/dL — ABNORMAL HIGH (ref 0.61–1.24)
Creatinine, Ser: 1.74 mg/dL — ABNORMAL HIGH (ref 0.61–1.24)
GFR, Estimated: 33 mL/min — ABNORMAL LOW (ref >60)
GFR, Estimated: 41 mL/min — ABNORMAL LOW (ref >60)
Glucose, Bld: 256 mg/dL — ABNORMAL HIGH (ref 70–99)
Glucose, Bld: 165 mg/dL — ABNORMAL HIGH (ref 70–99)
Potassium: 3.8 mmol/L (ref 3.5–5.1)
Potassium: 3.7 mmol/L (ref 3.5–5.1)
Sodium: 135 mmol/L (ref 135–145)
Sodium: 134 mmol/L — ABNORMAL LOW (ref 135–145)

## 2020-08-11 LAB — URINE DRUG SCREEN, QUALITATIVE (ARMC ONLY)
Amphetamines, Ur Screen: NOT DETECTED
Barbiturates, Ur Screen: NOT DETECTED
Benzodiazepine, Ur Scrn: NOT DETECTED
Cannabinoid 50 Ng, Ur ~~LOC~~: NOT DETECTED
Cocaine Metabolite,Ur ~~LOC~~: NOT DETECTED
MDMA (Ecstasy)Ur Screen: NOT DETECTED
Methadone Scn, Ur: NOT DETECTED
Opiate, Ur Screen: NOT DETECTED
Phencyclidine (PCP) Ur S: NOT DETECTED
Tricyclic, Ur Screen: NOT DETECTED

## 2020-08-11 LAB — URINALYSIS, COMPLETE (UACMP) WITH MICROSCOPIC
Bacteria, UA: NONE SEEN
Bilirubin Urine: NEGATIVE
Glucose, UA: >=500 mg/dL — AB
Hgb urine dipstick: NEGATIVE
Ketones, ur: 5 mg/dL — AB
Leukocytes,Ua: NEGATIVE
Nitrite: NEGATIVE
Protein, ur: 30 mg/dL — AB
Specific Gravity, Urine: 1.017 (ref 1.005–1.030)
Squamous Epithelial / HPF: NONE SEEN (ref 0–5)
pH: 9 — ABNORMAL HIGH (ref 5.0–8.0)

## 2020-08-11 LAB — MAGNESIUM: Magnesium: 2.2 mg/dL (ref 1.7–2.4)

## 2020-08-11 LAB — CBC
HCT: 36.5 % — ABNORMAL LOW (ref 39.0–52.0)
Hemoglobin: 12.7 g/dL — ABNORMAL LOW (ref 13.0–17.0)
MCH: 31 pg (ref 26.0–34.0)
MCHC: 34.8 g/dL (ref 30.0–36.0)
MCV: 89 fL (ref 80.0–100.0)
Platelets: 138 10*3/uL — ABNORMAL LOW (ref 150–400)
RBC: 4.1 MIL/uL — ABNORMAL LOW (ref 4.22–5.81)
RDW: 13.9 % (ref 11.5–15.5)
WBC: 7.8 10*3/uL (ref 4.0–10.5)
nRBC: 0 % (ref 0.0–0.2)

## 2020-08-11 LAB — PROTIME-INR
INR: 1.1 (ref 0.8–1.2)
Prothrombin Time: 14.5 seconds (ref 11.4–15.2)

## 2020-08-11 LAB — GLUCOSE, CAPILLARY
Glucose-Capillary: 104 mg/dL — ABNORMAL HIGH (ref 70–99)
Glucose-Capillary: 123 mg/dL — ABNORMAL HIGH (ref 70–99)
Glucose-Capillary: 156 mg/dL — ABNORMAL HIGH (ref 70–99)
Glucose-Capillary: 161 mg/dL — ABNORMAL HIGH (ref 70–99)
Glucose-Capillary: 165 mg/dL — ABNORMAL HIGH (ref 70–99)
Glucose-Capillary: 171 mg/dL — ABNORMAL HIGH (ref 70–99)
Glucose-Capillary: 172 mg/dL — ABNORMAL HIGH (ref 70–99)
Glucose-Capillary: 182 mg/dL — ABNORMAL HIGH (ref 70–99)
Glucose-Capillary: 215 mg/dL — ABNORMAL HIGH (ref 70–99)
Glucose-Capillary: 222 mg/dL — ABNORMAL HIGH (ref 70–99)
Glucose-Capillary: 245 mg/dL — ABNORMAL HIGH (ref 70–99)
Glucose-Capillary: 259 mg/dL — ABNORMAL HIGH (ref 70–99)
Glucose-Capillary: 368 mg/dL — ABNORMAL HIGH (ref 70–99)
Glucose-Capillary: 414 mg/dL — ABNORMAL HIGH (ref 70–99)
Glucose-Capillary: 576 mg/dL (ref 70–99)

## 2020-08-11 LAB — RESP PANEL BY RT-PCR (FLU A&B, COVID) ARPGX2
Influenza A by PCR: NEGATIVE
Influenza B by PCR: NEGATIVE
SARS Coronavirus 2 by RT PCR: NEGATIVE

## 2020-08-11 LAB — CBG MONITORING, ED
Glucose-Capillary: 543 mg/dL (ref 70–99)
Glucose-Capillary: >600 mg/dL (ref 70–99)

## 2020-08-11 LAB — MRSA PCR SCREENING: MRSA by PCR: NEGATIVE

## 2020-08-11 LAB — AMMONIA: Ammonia: 17 umol/L (ref 9–35)

## 2020-08-11 LAB — TSH: TSH: 4.58 u[IU]/mL — ABNORMAL HIGH (ref 0.350–4.500)

## 2020-08-11 MED ORDER — CHLORHEXIDINE GLUCONATE CLOTH 2 % EX PADS
6.0000 | MEDICATED_PAD | Freq: Every day | CUTANEOUS | Status: DC
  Administered 2020-08-11 – 2020-08-20 (×10): 6 via TOPICAL
  Filled 2020-08-11: qty 6

## 2020-08-11 MED ORDER — DEXTROSE IN LACTATED RINGERS 5 % IV SOLN: INTRAVENOUS | Status: DC

## 2020-08-11 MED ORDER — LACTATED RINGERS IV SOLN: INTRAVENOUS | Status: DC

## 2020-08-11 MED ORDER — HALOPERIDOL LACTATE 5 MG/ML IJ SOLN
2.0000 mg | Freq: Once | INTRAMUSCULAR | Status: AC
  Administered 2020-08-11: 2 mg via INTRAVENOUS
  Filled 2020-08-11: qty 1

## 2020-08-11 MED ORDER — NOREPINEPHRINE 4 MG/250ML-% IV SOLN: 0.0000 ug/min | INTRAVENOUS | Status: DC

## 2020-08-11 MED ORDER — DROPERIDOL 2.5 MG/ML IJ SOLN
2.5000 mg | Freq: Once | INTRAMUSCULAR | Status: AC
  Administered 2020-08-11: 2.5 mg via INTRAVENOUS
  Filled 2020-08-11 (×5): qty 2

## 2020-08-11 MED ORDER — HALOPERIDOL LACTATE 5 MG/ML IJ SOLN: 2.0000 mg | Freq: Four times a day (QID) | INTRAMUSCULAR | Status: DC | PRN

## 2020-08-11 MED ORDER — SODIUM CHLORIDE 0.9 % IV BOLUS
1000.0000 mL | Freq: Once | INTRAVENOUS | Status: AC
  Administered 2020-08-11: 1000 mL via INTRAVENOUS

## 2020-08-11 MED ORDER — LORAZEPAM 2 MG/ML IJ SOLN
1.0000 mg | Freq: Once | INTRAMUSCULAR | Status: AC
  Administered 2020-08-11: 1 mg via INTRAVENOUS
  Filled 2020-08-11: qty 1

## 2020-08-11 MED ORDER — HALOPERIDOL LACTATE 5 MG/ML IJ SOLN
INTRAMUSCULAR | Status: AC
  Administered 2020-08-11: 2 mg via INTRAVENOUS
  Filled 2020-08-11: qty 1

## 2020-08-11 MED ORDER — DEXMEDETOMIDINE HCL IN NACL 400 MCG/100ML IV SOLN
0.4000 ug/kg/h | INTRAVENOUS | Status: DC
  Administered 2020-08-11: 0.4 ug/kg/h via INTRAVENOUS
  Filled 2020-08-11: qty 100

## 2020-08-11 MED ORDER — METOPROLOL TARTRATE 5 MG/5ML IV SOLN
5.0000 mg | Freq: Four times a day (QID) | INTRAVENOUS | Status: DC
  Administered 2020-08-11: 5 mg via INTRAVENOUS
  Filled 2020-08-11: qty 5

## 2020-08-11 MED ORDER — METOPROLOL TARTRATE 5 MG/5ML IV SOLN: 5.0000 mg | Freq: Four times a day (QID) | INTRAVENOUS | Status: DC | PRN

## 2020-08-11 MED ORDER — LORAZEPAM 2 MG/ML IJ SOLN
1.0000 mg | INTRAMUSCULAR | Status: DC | PRN
  Administered 2020-08-11: 1 mg via INTRAVENOUS
  Filled 2020-08-11: qty 1

## 2020-08-11 MED ORDER — INSULIN ASPART 100 UNIT/ML IJ SOLN
0.0000 [IU] | INTRAMUSCULAR | Status: DC
  Administered 2020-08-11 (×3): 3 [IU] via SUBCUTANEOUS
  Administered 2020-08-12 (×2): 2 [IU] via SUBCUTANEOUS
  Administered 2020-08-12: 3 [IU] via SUBCUTANEOUS
  Administered 2020-08-13 (×2): 2 [IU] via SUBCUTANEOUS
  Administered 2020-08-13: 8 [IU] via SUBCUTANEOUS
  Administered 2020-08-13: 3 [IU] via SUBCUTANEOUS
  Administered 2020-08-13 – 2020-08-14 (×5): 5 [IU] via SUBCUTANEOUS
  Administered 2020-08-14: 2 [IU] via SUBCUTANEOUS
  Administered 2020-08-15 (×2): 3 [IU] via SUBCUTANEOUS
  Administered 2020-08-15: 2 [IU] via SUBCUTANEOUS
  Administered 2020-08-15 (×2): 3 [IU] via SUBCUTANEOUS
  Filled 2020-08-11 (×21): qty 1

## 2020-08-11 MED ORDER — DEXMEDETOMIDINE HCL IN NACL 400 MCG/100ML IV SOLN
0.4000 ug/kg/h | INTRAVENOUS | Status: DC
  Administered 2020-08-12: 0.1 ug/kg/h via INTRAVENOUS
  Filled 2020-08-11: qty 100

## 2020-08-11 MED ORDER — INSULIN GLARGINE 100 UNIT/ML ~~LOC~~ SOLN
12.0000 [IU] | SUBCUTANEOUS | Status: DC
  Administered 2020-08-11 – 2020-08-20 (×9): 12 [IU] via SUBCUTANEOUS
  Filled 2020-08-11 (×13): qty 0.12

## 2020-08-11 MED ORDER — SODIUM CHLORIDE 0.9 % IV SOLN: INTRAVENOUS | Status: DC

## 2020-08-11 MED ORDER — SODIUM CHLORIDE 0.9 % IV SOLN: 250.0000 mL | INTRAVENOUS | Status: DC

## 2020-08-11 NOTE — H&P (Signed)
History and Physical    Hector Mueller ZOX:[id number] DOB: [birth year] DOA: 08/10/2020  PCP: [name], MD  Patient coming from: Home via EMS  I have personally briefly reviewed patient's old medical records in [place]  Chief Complaint: Altered mental status/hypoglycemia  HPI: Hector Mueller is a 70 y.o. 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  Patient is unable to provide any history due to altered mental status which is otherwise obtained from EDP and chart review.  Patient reportedly with altered mental status since 5 PM on 5/30.  EMS were called to where he is living and on their arrival he was somnolent with blood sugar reading "high."  He is reportedly not adherent to his home medications.  Patient somnolent and moaning while lying in bed.  Safety mittens are in place.  He is moving all extremities spontaneously.  He is not communicating appropriately or following commands.  ED Course:  Initial vitals show BP 112/86, pulse 87, RR 24, temp not recorded, SPO2 98% on room air.  Labs show serum glucose 859, sodium 124 (142 when corrected for hyperglycemia), potassium 5.3, chloride 83, bicarb 25, BUN 39, creatinine 2.20, AST 12, ALT 13, alk phos 88, total bilirubin 1.0, beta hydroxybutyrate 3.72, anion gap 16, osmolality 323, WBC 5.0, hemoglobin 11.9, platelets 127,000.  VBG shows pH 7.42, PCO2 46, PO2 <31.0.  SARS-CoV-2 PCR obtained and pending.  Portable chest x-ray negative for active disease.  Patient was started on insulin infusion.  He was given 2 L LR.  He was given droperidol 2.5 mg x 2 due to  significant agitation.  The hospitalist service was consulted to admit for further evaluation and management.  Review of Systems:  Unable to obtain review of systems due to encephalopathy.   Past Medical History:  Diagnosis Date  . Atrial fibrillation ([place])   . BPH (benign prostatic hyperplasia)   . Cancer ([place])    liver  . CHF (congestive heart failure) ([place])   . Chronic kidney disease   . Cirrhosis ([place])   . Diabetes mellitus without complication ([place])   . GERD (gastroesophageal reflux disease)   . Hepatitis C   . Hx of liver transplant ([place])   . Pulmonary HTN ([place])     Past Surgical History:  Procedure Laterality Date  . ANKLE FRACTURE SURGERY Bilateral   . BACK SURGERY    . CLAVICLE SURGERY    . INTRAMEDULLARY (IM) NAIL INTERTROCHANTERIC Right 11/28/2017   Procedure: INTRAMEDULLARY (IM) NAIL INTERTROCHANTRIC;  Surgeon: Earnestine Leys, MD;  Location: [facility] ORS;  Service: Orthopedics;  Laterality: Right;  . LIVER SURGERY      Social History:  reports that he has never smoked. He has never used smokeless tobacco. He reports that he does not drink alcohol and does not use drugs.  No Known Allergies  History reviewed. No pertinent family history.   Prior to Admission medications   Medication Sig Start Date End Date Taking? Authorizing Provider  alendronate (FOSAMAX) 70 MG tablet Take 70 mg by mouth once a week. Take with a full glass of water on an empty stomach.    [provider]  amiodarone (PACERONE) 200 MG tablet Take 1 tablet (  200 mg total) by mouth 2 (two) times daily for 7 days, THEN 1 tablet (200 mg total) daily. 07/21/20 08/27/20  Sidney Ace, MD  Cholecalciferol (VITAMIN D3) 125 MCG (5000 UT) CAPS Take 3,000 Units by mouth daily.    [provider]  cycloSPORINE modified (NEORAL) 100 MG capsule Take 200 mg by mouth daily.    [provider]  cycloSPORINE modified (NEORAL) 25 MG capsule Take 175 mg by mouth every evening.     [provider]  DULoxetine (CYMBALTA) 30 MG capsule Take 30 mg by mouth daily. 05/22/20   [provider]  Insulin Glargine (BASAGLAR KWIKPEN) 100 UNIT/ML Inject 12 Units into the skin daily as needed. Patient not taking: Reported on 07/29/2020    [provider]  Melatonin 3 MG TABS Take 3 mg by mouth. 02/06/17   [provider]  metoprolol succinate (TOPROL-XL) 25 MG 24 hr tablet Take 1 tablet (25 mg total) by mouth 2 (two) times daily. 07/20/20 08/19/20  Sidney Ace, MD  mirtazapine (REMERON) 15 MG tablet Take 15 mg by mouth at bedtime.  10/18/17   [provider]  mycophenolate (CELLCEPT) 250 MG capsule Take 500 mg by mouth in the morning and at bedtime. 04/14/20 04/14/21  [provider]  omeprazole (PRILOSEC) 20 MG capsule Take 20 mg by mouth daily.    [provider]  ondansetron (ZOFRAN) 4 MG tablet Take 1 tablet (4 mg total) by mouth every 6 (six) hours as needed for nausea. 12/01/17   Vaughan Basta, MD  OZEMPIC, 0.25 OR 0.5 MG/DOSE, 2 MG/1.5ML SOPN Inject 0.5 mg into the skin once a week. 05/04/20   [provider]  OZEMPIC, 1 MG/DOSE, 4 MG/3ML SOPN Inject 1 mg into the skin once a week. Patient not taking: Reported on 07/29/2020 05/19/20   [provider]  rosuvastatin (CRESTOR) 5 MG tablet Take 5 mg by mouth daily. 06/09/20   [provider]  sildenafil (REVATIO) 20 MG tablet Take 40 mg by mouth 3 (three) times daily.  10/17/17   [provider]  sodium bicarbonate 650 MG tablet Take 650 mg by mouth in the morning and at bedtime. 03/18/20 03/18/21  [provider]  Specialty Vitamins Products (MAGNESIUM, AMINO ACID CHELATE,) 133 MG tablet Take 2 tablets by mouth 2 (two) times daily. 03/02/17   [provider]  Tacrine Hydrochloride (COGNEX PO) Take 2 capsules by mouth daily at 6 (six) AM. With a glass of water    [provider]  warfarin (COUMADIN) 2.5 MG tablet Take  1 tablet (2.5 mg total) by mouth daily. 07/20/20 07/20/21  Sidney Ace, MD    Physical Exam: Vitals:   08/10/20 2248 08/10/20 2249 08/10/20 2312 08/11/20 0030  BP:    133/79  Pulse:  92  90  Resp:    (!) 22  TempSrc:  Oral    SpO2: 98%   100%  Weight:   81 kg   Height:   5' 9"  (1.753 m)    Exam limited due to encephalopathy. Constitutional: Chronically ill-appearing man resting supine in bed, stuporous Eyes: PERRL, lids and conjunctivae normal ENMT: Mucous membranes are dry. Posterior pharynx clear of any exudate or lesions. Neck: normal, supple, no masses. Respiratory: clear to auscultation anteriorly.  Normal respiratory effort. No accessory muscle use.  Cardiovascular: Regular rate and rhythm, no murmurs / rubs / gallops. No extremity edema. 2+ pedal pulses. Abdomen: no masses palpated. Musculoskeletal: no clubbing / cyanosis. No  joint deformity upper and lower extremities. Good ROM, no contractures. Skin: no rashes, lesions, ulcers. No induration Neurologic: Strength appears equal throughout, sensation appears intact Psychiatric: Stuporous  Labs on Admission: I have personally reviewed following labs and imaging studies  CBC: Recent Labs  Lab 08/10/20 2251  WBC 5.0  NEUTROABS 3.6  HGB 11.9*  HCT 35.7*  MCV 91.8  PLT 027*   Basic Metabolic Panel: Recent Labs  Lab 08/10/20 2251  NA 124*  K 5.3*  CL 83*  CO2 25  GLUCOSE 859*  BUN 39*  CREATININE 2.20*  CALCIUM 9.7   GFR: Estimated Creatinine Clearance: 30.4 mL/min (A) (by C-G formula based on SCr of 2.2 mg/dL (H)). Liver Function Tests: Recent Labs  Lab 08/10/20 2251  AST 12*  ALT 13  ALKPHOS 88  BILITOT 1.0  PROT 6.9  ALBUMIN 3.8   No results for input(s): LIPASE, AMYLASE in the last 168 hours. No results for input(s): AMMONIA in the last 168 hours. Coagulation Profile: No results for input(s): INR, PROTIME in the last 168 hours. Cardiac Enzymes: No results for input(s): CKTOTAL, CKMB,  CKMBINDEX, TROPONINI in the last 168 hours. BNP (last 3 results) No results for input(s): PROBNP in the last 8760 hours. HbA1C: No results for input(s): HGBA1C in the last 72 hours. CBG: Recent Labs  Lab 08/10/20 2259 08/11/20 0045  GLUCAP >600* 543*   Lipid Profile: No results for input(s): CHOL, HDL, LDLCALC, TRIG, CHOLHDL, LDLDIRECT in the last 72 hours. Thyroid Function Tests: No results for input(s): TSH, T4TOTAL, FREET4, T3FREE, THYROIDAB in the last 72 hours. Anemia Panel: No results for input(s): VITAMINB12, FOLATE, FERRITIN, TIBC, IRON, RETICCTPCT in the last 72 hours. Urine analysis:    Component Value Date/Time   COLORURINE STRAW (A) 07/10/2020 2247   APPEARANCEUR CLEAR (A) 07/10/2020 2247   LABSPEC 1.021 07/10/2020 2247   PHURINE 5.0 07/10/2020 2247   GLUCOSEU >=500 (A) 07/10/2020 2247   HGBUR MODERATE (A) 07/10/2020 2247   BILIRUBINUR NEGATIVE 07/10/2020 2247   KETONESUR 80 (A) 07/10/2020 2247   PROTEINUR 30 (A) 07/10/2020 2247   NITRITE NEGATIVE 07/10/2020 2247   LEUKOCYTESUR NEGATIVE 07/10/2020 2247    Radiological Exams on Admission: DG Chest Portable 1 View  Result Date: 08/10/2020 CLINICAL DATA:  Altered mental status EXAM: PORTABLE CHEST 1 VIEW COMPARISON:  07/15/2020 FINDINGS: The heart size and mediastinal contours are within normal limits. Aortic atherosclerosis. Low lung volumes. Surgical hardware in the lower spine both lungs are clear. The visualized skeletal structures are unremarkable. IMPRESSION: No active disease. Electronically Signed   By: Donavan Foil M.D.   On: 08/10/2020 23:17    EKG: Personally reviewed. Sinus rhythm with early R wave progression.  Assessment/Plan Principal Problem:   Type 2 diabetes mellitus with hyperosmolar hyperglycemic state (HHS) ([place]) Active Problems:   Atrial fibrillation (HCC)   HFrEF (heart failure with reduced ejection fraction) (HCC)   Hx of liver transplant ([place])   CKD (chronic kidney disease), stage  III ([place])   Hector Mueller is a 70 y.o. 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  Insulin-dependent type 2 diabetes with HHS and stupor/coma: Partly due to nonadherence of her medications.  Recent admit for the same at which time he  required intubation. -Continue insulin infusion per protocol -Continue IV fluids as ordered and transition to D5 in LR when CBG <150 -Monitor BMP q4h and transition to subcutaneous insulin as able -Keep n.p.o. -Continue safety mittens and safety sitter as needed for agitation  Paroxysmal atrial fibrillation: In sinus rhythm on admission.  On Coumadin and amiodarone as an outpatient.  Check INR, resume meds when mental status improves.  Chronic systolic CHF with right heart failure Pulmonary artery hypertension: Appears volume depleted on admission.  Monitor strict I/O's with IV fluid hydration.  Resume home meds when mental status improves.  CKD stage IIIb-IV: Renal function slightly improved from recent baseline.  Continue to monitor.  Anemia of chronic disease Thrombocytopenia: Chronic and stable, continue to monitor.  History of liver cirrhosis due to hepatitis C s/p liver transplant in 2018: Resume antirejection medications as soon as able.  LFTs stable on admission.  DVT prophylaxis: SCDs while awaiting INR and ability to resume Coumadin Code Status: Full code based on prior Family Communication: None available on admission Disposition Plan: From home, dispo pending clinical progress Consults called: None Level of care: Stepdown Admission status:  Status is: Inpatient  Remains inpatient  appropriate because:Altered mental status, Unsafe d/c plan, IV treatments appropriate due to intensity of illness or inability to take PO and Inpatient level of care appropriate due to severity of illness   Dispo: The patient is from: Home              Anticipated d/c is to: Home              Patient currently is not medically stable to d/c.    Zada Finders MD [facility]  If 7PM-7AM, please contact night-coverage [email or web address]  08/11/2020, 1:29 AM

## 2020-08-11 NOTE — Progress Notes (Signed)
Inpatient Diabetes Program Recommendations  AACE/ADA: New Consensus Statement on Inpatient Glycemic Control (2015)  Target Ranges:  Prepandial:   less than 140 mg/dL      Peak postprandial:   less than 180 mg/dL (1-2 hours)      Critically ill patients:  140 - 180 mg/dL   Lab Results  Component Value Date   GLUCAP 161 (H) 08/11/2020   HGBA1C 10.8 (H) 07/11/2020    Review of Glycemic Control  Diabetes history: DM2 Outpatient Diabetes medications: Lantus 12 units + Ozempic 1 mg q week (note reports patient not taking either meds) Current orders for Inpatient glycemic control: IV insulin  Inpatient Diabetes Program Recommendations:   When patient ready to transition to subcutaneous insulin, give basal insulin 2 hrs prior to IV insulin discontinued and cover CBG with correction when IV insulin stopped. Consider: -Lantus 12 units qd -Add Novolog meal coverage when eating -Novolog correciton 0-9 q 4 hrs while NPO, then tid + 0-5 units hs  Will plan to speak with patient regarding medication management. Patient sees Dr. Mee Hives for endocrinology.  Thank you, Nani Gasser. Shaletha Humble, RN, MSN, CDE  Diabetes Coordinator Inpatient Glycemic Control Team Team Pager [phone] (8am-5pm) 08/11/2020 7:35 AM

## 2020-08-11 NOTE — Progress Notes (Signed)
Patient so restless and agitated this morning. Patient's leg dangling on side of the bed, swinging legs and arms. PT not redirectable.   Dr. Roderic Palau notified and ordered for some Ativan.

## 2020-08-11 NOTE — Progress Notes (Signed)
Patient admitted to the hospital earlier this morning by Dr. Posey Pronto  Patient seen and examined. He is somnolent, agitated and cannot participate in conversation. He has mitts on. He is moving all for extremities spontaneously. Pupils are equal and reactive. No facial asymmetry.   A/P:  Uncontrolled type 2 diabetes with HHS -started on IV insulin and IV fluids -overall blood sugars have improved -will transition to subcutaneous insulin  Acute metabolic encephalopathy -likely related to HHS -still very agitated and somnolent -will check urinalysis, UDS, ammonia, TSH, CT head -PCO2 was normal on ABG -continue sedatives as needed for agitation  History of paroxysmal A fib -sinus rhythm on admission -currently tachycardic on monitor -check EKG -will start on beta blocker since he is on metoprolol at home -if CT head negative, can consider starting heparin for now until more awake  Chronic systolic CHF Pulmonary hypertension -admitted with volume depletion -continue careful hydration  CKD3b -overall creatinine improving with IV hydration -continue to monitor  History of 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

## 2020-08-12 LAB — BASIC METABOLIC PANEL
Anion gap: 6 (ref 5–15)
BUN: 20 mg/dL (ref 8–23)
CO2: 25 mmol/L (ref 22–32)
Calcium: 8.4 mg/dL — ABNORMAL LOW (ref 8.9–10.3)
Chloride: 106 mmol/L (ref 98–111)
Creatinine, Ser: 1.43 mg/dL — ABNORMAL HIGH (ref 0.61–1.24)
GFR, Estimated: 52 mL/min — ABNORMAL LOW (ref >60)
Glucose, Bld: 110 mg/dL — ABNORMAL HIGH (ref 70–99)
Potassium: 3.6 mmol/L (ref 3.5–5.1)
Sodium: 137 mmol/L (ref 135–145)

## 2020-08-12 LAB — GLUCOSE, CAPILLARY
Glucose-Capillary: 104 mg/dL — ABNORMAL HIGH (ref 70–99)
Glucose-Capillary: 142 mg/dL — ABNORMAL HIGH (ref 70–99)
Glucose-Capillary: 103 mg/dL — ABNORMAL HIGH (ref 70–99)
Glucose-Capillary: 127 mg/dL — ABNORMAL HIGH (ref 70–99)
Glucose-Capillary: 159 mg/dL — ABNORMAL HIGH (ref 70–99)

## 2020-08-12 LAB — CBC
HCT: 28.9 % — ABNORMAL LOW (ref 39.0–52.0)
Hemoglobin: 9.8 g/dL — ABNORMAL LOW (ref 13.0–17.0)
MCH: 31.3 pg (ref 26.0–34.0)
MCHC: 33.9 g/dL (ref 30.0–36.0)
MCV: 92.3 fL (ref 80.0–100.0)
Platelets: 97 10*3/uL — ABNORMAL LOW (ref 150–400)
RBC: 3.13 MIL/uL — ABNORMAL LOW (ref 4.22–5.81)
RDW: 14.6 % (ref 11.5–15.5)
WBC: 4.2 10*3/uL (ref 4.0–10.5)
nRBC: 0 % (ref 0.0–0.2)

## 2020-08-12 MED ORDER — MIRTAZAPINE 15 MG PO TABS
15.0000 mg | ORAL_TABLET | Freq: Every day | ORAL | Status: DC
  Administered 2020-08-12 – 2020-08-19 (×8): 15 mg via ORAL
  Filled 2020-08-12 (×8): qty 1

## 2020-08-12 MED ORDER — AMIODARONE HCL 200 MG PO TABS
200.0000 mg | ORAL_TABLET | Freq: Every day | ORAL | Status: DC
  Administered 2020-08-12 – 2020-08-20 (×9): 200 mg via ORAL
  Filled 2020-08-12 (×9): qty 1

## 2020-08-12 MED ORDER — WARFARIN - PHARMACIST DOSING INPATIENT: Freq: Every day | Status: DC

## 2020-08-12 MED ORDER — MELATONIN 5 MG PO TABS
2.5000 mg | ORAL_TABLET | Freq: Every day | ORAL | Status: DC
  Administered 2020-08-12 – 2020-08-19 (×8): 2.5 mg via ORAL
  Filled 2020-08-12 (×8): qty 1

## 2020-08-12 MED ORDER — ROSUVASTATIN CALCIUM 10 MG PO TABS
5.0000 mg | ORAL_TABLET | Freq: Every day | ORAL | Status: DC
  Administered 2020-08-12 – 2020-08-20 (×9): 5 mg via ORAL
  Filled 2020-08-12 (×9): qty 1

## 2020-08-12 MED ORDER — MYCOPHENOLATE MOFETIL 250 MG PO CAPS
500.0000 mg | ORAL_CAPSULE | Freq: Two times a day (BID) | ORAL | Status: DC
  Administered 2020-08-12 – 2020-08-20 (×16): 500 mg via ORAL
  Filled 2020-08-12 (×17): qty 2

## 2020-08-12 MED ORDER — SILDENAFIL CITRATE 20 MG PO TABS
40.0000 mg | ORAL_TABLET | Freq: Three times a day (TID) | ORAL | Status: DC
  Administered 2020-08-12 – 2020-08-20 (×22): 40 mg via ORAL
  Filled 2020-08-12 (×27): qty 2

## 2020-08-12 MED ORDER — CYCLOSPORINE MODIFIED (NEORAL) 25 MG PO CAPS
200.0000 mg | ORAL_CAPSULE | Freq: Every day | ORAL | Status: DC
  Administered 2020-08-12 – 2020-08-20 (×9): 200 mg via ORAL
  Filled 2020-08-12: qty 2
  Filled 2020-08-12: qty 8
  Filled 2020-08-12 (×2): qty 2
  Filled 2020-08-12: qty 8
  Filled 2020-08-12 (×7): qty 2

## 2020-08-12 MED ORDER — WARFARIN SODIUM 2.5 MG PO TABS
3.7500 mg | ORAL_TABLET | Freq: Once | ORAL | Status: AC
  Administered 2020-08-12: 3.75 mg via ORAL
  Filled 2020-08-12: qty 1.5

## 2020-08-12 MED ORDER — PANTOPRAZOLE SODIUM 40 MG PO TBEC
40.0000 mg | DELAYED_RELEASE_TABLET | Freq: Every day | ORAL | Status: DC
  Administered 2020-08-12 – 2020-08-19 (×8): 40 mg via ORAL
  Filled 2020-08-12 (×8): qty 1

## 2020-08-12 MED ORDER — LACTULOSE 10 GM/15ML PO SOLN
10.0000 g | Freq: Two times a day (BID) | ORAL | Status: DC
  Administered 2020-08-12 – 2020-08-15 (×4): 10 g via ORAL
  Filled 2020-08-12 (×9): qty 30

## 2020-08-12 MED ORDER — METOPROLOL SUCCINATE ER 25 MG PO TB24
25.0000 mg | ORAL_TABLET | Freq: Two times a day (BID) | ORAL | Status: DC
  Administered 2020-08-12 – 2020-08-20 (×10): 25 mg via ORAL
  Filled 2020-08-12 (×14): qty 1

## 2020-08-12 MED ORDER — SODIUM BICARBONATE 650 MG PO TABS
650.0000 mg | ORAL_TABLET | Freq: Two times a day (BID) | ORAL | Status: DC
  Administered 2020-08-12 – 2020-08-14 (×5): 650 mg via ORAL
  Filled 2020-08-12 (×6): qty 1

## 2020-08-12 NOTE — Consult Note (Signed)
ANTICOAGULATION CONSULT NOTE - Initial Consult  Pharmacy Consult for Warfarin Dosing Indication: atrial fibrillation  No Known Allergies  Patient Measurements: Height: 5\' 9"  (175.3 cm) Weight: 72.1 kg (158 lb 15.2 oz) IBW/kg (Calculated) : 70.7 Heparin Dosing Weight: 72.1 kg  Vital Signs: Temp: 98.6 F (37 C) (06/01 1600) Temp Source: Oral (06/01 1600) BP: 162/68 (06/01 1800) Pulse Rate: 88 (06/01 1800)  Labs: Recent Labs    08/10/20 2251 08/11/20 0304 08/11/20 0756 08/12/20 0336  HGB 11.9* 12.7*  --  9.8*  HCT 35.7* 36.5*  --  28.9*  PLT 127* 138*  --  97*  LABPROT  --  14.5  --   --   INR  --  1.1  --   --   CREATININE 2.20* 2.08* 1.74* 1.43*    Estimated Creatinine Clearance: 46.7 mL/min (A) (by C-G formula based on SCr of 1.43 mg/dL (H)).   Medical History: Past Medical History:  Diagnosis Date  . Atrial fibrillation ([place])   . BPH (benign prostatic hyperplasia)   . Cancer ([place])    liver  . CHF (congestive heart failure) ([place])   . Chronic kidney disease   . Cirrhosis ([place])   . Diabetes mellitus without complication ([place])   . GERD (gastroesophageal reflux disease)   . Hepatitis C   . Hx of liver transplant ([place])   . Pulmonary HTN ([facility])     Medications:  Medications Prior to Admission  Medication Sig Dispense Refill Last Dose  . bisacodyl (DULCOLAX) 5 MG EC tablet Take 5 mg by mouth daily as needed for moderate constipation.   prn at prn  . chlorthalidone (HYGROTON) 25 MG tablet Take 12.5 mg by mouth in the morning.   unknown at unknown  . Cholecalciferol (VITAMIN D3) 125 MCG (5000 UT) CAPS Take 3,000 Units by mouth daily.   unknown at unknown  . cycloSPORINE modified (NEORAL) 100 MG capsule Take 200 mg by mouth daily.   unknown at unknown  . cycloSPORINE modified (NEORAL) 25 MG capsule Take 175 mg by mouth every evening.   unknown at unknown  . DULoxetine (CYMBALTA) 30 MG capsule Take 60 mg by mouth at bedtime.   unknown at unknown  . Melatonin 3 MG  TABS Take 3 mg by mouth at bedtime.   unknown at unknown  . mirtazapine (REMERON) 15 MG tablet Take 15 mg by mouth at bedtime.    unknown at unknown  . mycophenolate (CELLCEPT) 250 MG capsule Take 500 mg by mouth in the morning and at bedtime.   unknown at unknown  . OZEMPIC, 1 MG/DOSE, 4 MG/3ML SOPN Inject 1 mg into the skin once a week.   unknown at unknown  . pregabalin (LYRICA) 75 MG capsule Take 75 mg by mouth in the morning and at bedtime.   unknown at unknown  . rosuvastatin (CRESTOR) 5 MG tablet Take 5 mg by mouth daily.   unknown at unknown  . sildenafil (REVATIO) 20 MG tablet Take 40 mg by mouth 3 (three) times daily.    unknown at unknown  . sodium bicarbonate 650 MG tablet Take 650 mg by mouth in the morning and at bedtime.   unknown at unknown  . Specialty Vitamins Products (MAGNESIUM, AMINO ACID CHELATE,) 133 MG tablet Take 2 tablets by mouth 2 (two) times daily.   unknown at unknown  . alendronate (FOSAMAX) 70 MG tablet Take 70 mg by mouth once a week. Take with a full glass of water on an empty stomach.     Marland Kitchen  amiodarone (PACERONE) 200 MG tablet Take 1 tablet (200 mg total) by mouth 2 (two) times daily for 7 days, THEN 1 tablet (200 mg total) daily. 44 tablet 0   . Insulin Glargine (BASAGLAR KWIKPEN) 100 UNIT/ML Inject 12 Units into the skin daily as needed. (Patient not taking: No sig reported)     . metoprolol succinate (TOPROL-XL) 25 MG 24 hr tablet Take 1 tablet (25 mg total) by mouth 2 (two) times daily. 60 tablet 0   . omeprazole (PRILOSEC) 20 MG capsule Take 20 mg by mouth daily.     . ondansetron (ZOFRAN) 4 MG tablet Take 1 tablet (4 mg total) by mouth every 6 (six) hours as needed for nausea. 20 tablet 0   . OZEMPIC, 0.25 OR 0.5 MG/DOSE, 2 MG/1.5ML SOPN Inject 0.5 mg into the skin once a week. (Patient not taking: Reported on 08/11/2020)   Not Taking at Unknown time  . Tacrine Hydrochloride (COGNEX PO) Take 2 capsules by mouth daily at 6 (six) AM. With a glass of water     .  warfarin (COUMADIN) 2.5 MG tablet Take 1 tablet (2.5 mg total) by mouth daily. 30 tablet 11    Scheduled:  . amiodarone  200 mg Oral Daily  . Chlorhexidine Gluconate Cloth  6 each Topical Daily  . cycloSPORINE modified  200 mg Oral Daily  . insulin aspart  0-15 Units Subcutaneous Q4H  . insulin glargine  12 Units Subcutaneous Q24H  . lactulose  10 g Oral BID  . melatonin  2.5 mg Oral QHS  . metoprolol succinate  25 mg Oral BID  . mirtazapine  15 mg Oral QHS  . mycophenolate  500 mg Oral BID  . pantoprazole  40 mg Oral Daily  . rosuvastatin  5 mg Oral Daily  . sildenafil  40 mg Oral TID  . sodium bicarbonate  650 mg Oral BID  . warfarin  3.75 mg Oral Once  . [START ON 08/13/2020] Warfarin - Pharmacist Dosing Inpatient   Does not apply q1600   Infusions:  . sodium chloride 100 mL/hr at 08/12/20 0200  . sodium chloride    . dexmedetomidine (PRECEDEX) IV infusion Stopped (08/12/20 0742)   PRN: dextrose, haloperidol lactate, LORazepam, metoprolol tartrate Anti-infectives (From admission, onward)   None      Assessment: Pharmacy has been consulted for Warfarin dosing and monitoring in 70yo patient with history of Atrial Fibrillation. Patient has been taking warfarin PTA with their home dose being 2.5mg  daily. 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  DDI: patient on amiodarone and cyclosporine  5/31 INR 1.1  Goal of Therapy:  INR 2-3 Monitor platelets by anticoagulation protocol: Yes   Plan:  --Warfarin 3.375mg  x 1 dose(1.5 times patient's home dose) --INR/CBC daily   Allice Garro A Alicianna Litchford 08/12/2020,6:48 PM

## 2020-08-12 NOTE — Progress Notes (Signed)
Precedex gtt stopped this AM near 0700 upon start of shift. Pt progressively awakening, O x 3 to 4, calm/cooperative. Pt started on heart healthy carb mod diet per Dr. Reesa Chew. Lactulose started BID. Pt voiding via external cath. Sitter @ bedside.

## 2020-08-12 NOTE — Progress Notes (Signed)
PROGRESS NOTE    Hector Mueller  LKG:[id number] DOB: 04/08/1950 DOA: 08/10/2020 PCP: [name], MD   Brief Narrative: Taken from H&P and prior notes. Hector Mueller is a 70 y.o. 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 Found to have CBG of 859, admitted for hyperosmolar hyperglycemia, initially managed with insulin infusion. Due to worsening agitation he was started on Precedex infusion-it was discontinued this morning.  Subjective: Patient was seen and examined today.  He appears little lethargic.  Denies any pain or shortness of breath.  Started to waking up little more and appears much calmer.  Sitter at bedside.  Assessment & Plan:   Principal Problem:   Type 2 diabetes mellitus with hyperosmolar hyperglycemic state (HHS) ([place]) Active Problems:   Atrial fibrillation (HCC)   HFrEF (heart failure with reduced ejection fraction) (HCC)   Hx of liver transplant ([place])   CKD (chronic kidney disease), stage III (HCC)  Acute metabolic encephalopathy.  Most likely related to HHS, became quite agitated and somnolent.  PCO2 normal on ABG.  TSH mildly elevated at 4.5, ammonia levels within normal limit, UA with some proteinuria but no pyuria or bacteriuria, UDS negative. Precedex infusion has been discontinued appears more calmer and little alert.  Able to answer questions appropriately. -Continue to monitor  Paroxysmal atrial fibrillation.  Currently in sinus rhythm. -Restart home metoprolol -Restart home Coumadin-INR was subtherapeutic not sure whether patient was taking it, per patient he was missing doses as keep  forgetting to take his medications.  Uncontrolled type 2 diabetes mellitus with HHS.  CBG currently within goal. -Continue with SSI -Continue Lantus 12 units daily  CKD stage IIIb.  Creatinine seems improved from his baseline with IV fluid. -Monitor renal function -Avoid nephrotoxins  History of liver cirrhosis secondary to hep C s/p liver transplant in 2018. Ammonia levels within normal limit and LFTs stable. -Continue home meds.  Anemia of chronic disease/thrombocytopenia.  Seems stable. -Continue to monitor  Chronic HFrEF with right heart failure/pulmonary hypertension.  Appears well compensated. -Continue home meds -Daily weight and BMP  Objective: Vitals:   08/12/20 1400 08/12/20 1500 08/12/20 1600 08/12/20 1700  BP: (!) 128/108 128/85 138/70 (!) 147/75  Pulse: 85 84 89 86  Resp: 16 15 (!) 21 19  Temp:   98.6 F (37 C)   TempSrc:   Oral   SpO2: 100% 99% 99% 100%  Weight:      Height:        Intake/Output Summary (Last 24 hours) at 08/12/2020 1756 Last data filed at 08/12/2020 0200 Gross per 24 hour  Intake 1626.16 ml  Output 175 ml  Net 1451.16 ml   Filed Weights   08/10/20 2312 08/11/20 0227 08/12/20 0411  Weight: 81 kg 68.2 kg 72.1 kg    Examination:  General exam: Appears calm and comfortable  Respiratory system: Clear to auscultation. Respiratory effort normal. Cardiovascular system: S1 & S2 heard, RRR. No JVD, murmurs, rubs, gallops or clicks. Gastrointestinal system: Soft, nontender, nondistended, bowel sounds positive. Central nervous system: Alert and oriented. No focal neurological deficits. Extremities: No edema, no cyanosis, pulses intact and symmetrical. Psychiatry: Judgement and insight appear mildly impaired  DVT prophylaxis: Coumadin Code Status: Full  Family Communication:  Disposition Plan:  Status is: Inpatient  Remains inpatient appropriate because:Inpatient level of care appropriate due to severity of illness   Dispo: The patient  is from: Home              Anticipated d/c is to: Home              Patient currently is not medically stable to d/c.   Difficult to place patient No              Level of care: Stepdown  All the records are reviewed and case discussed with Care Management/Social Worker. Management plans discussed with the patient, nursing and they are in agreement.  Consultants:   PCCM  Procedures:  Antimicrobials:   Data Reviewed: I have personally reviewed following labs and imaging studies  CBC: Recent Labs  Lab 08/10/20 2251 08/11/20 0304 08/12/20 0336  WBC 5.0 7.8 4.2  NEUTROABS 3.6  --   --   HGB 11.9* 12.7* 9.8*  HCT 35.7* 36.5* 28.9*  MCV 91.8 89.0 92.3  PLT 127* 138* 97*   Basic Metabolic Panel: Recent Labs  Lab 08/10/20 2251 08/11/20 0304 08/11/20 0756 08/11/20 1141 08/12/20 0336  NA 124* 135 134*  --  137  K 5.3* 3.8 3.7  --  3.6  CL 83* 94* 94*  --  106  CO2 25 27 26   --  25  GLUCOSE 859* 256* 165*  --  110*  BUN 39* 34* 30*  --  20  CREATININE 2.20* 2.08* 1.74*  --  1.43*  CALCIUM 9.7 10.2 9.7  --  8.4*  MG  --   --   --  2.2  --    GFR: Estimated Creatinine Clearance: 46.7 mL/min (A) (by C-G formula based on SCr of 1.43 mg/dL (H)). Liver Function Tests: Recent Labs  Lab 08/10/20 2251  AST 12*  ALT 13  ALKPHOS 88  BILITOT 1.0  PROT 6.9  ALBUMIN 3.8   No results for input(s): LIPASE, AMYLASE in the last 168 hours. Recent Labs  Lab 08/11/20 1141  AMMONIA 17   Coagulation Profile: Recent Labs  Lab 08/11/20 0304  INR 1.1   Cardiac Enzymes: No results for input(s): CKTOTAL, CKMB, CKMBINDEX, TROPONINI in the last 168 hours. BNP (last 3 results) No results for input(s): PROBNP in the last 8760 hours. HbA1C: No results for input(s): HGBA1C in the last 72 hours. CBG: Recent Labs  Lab 08/11/20 2342 08/12/20 0349 08/12/20 0736 08/12/20 1142 08/12/20 1537  GLUCAP 156* 104* 142* 103* 127*   Lipid Profile: No results for input(s): CHOL, HDL,  LDLCALC, TRIG, CHOLHDL, LDLDIRECT in the last 72 hours. Thyroid Function Tests: Recent Labs    08/11/20 1141  TSH 4.580*   Anemia Panel: No results for input(s): VITAMINB12, FOLATE, FERRITIN, TIBC, IRON, RETICCTPCT in the last 72 hours. Sepsis Labs: No results for input(s): PROCALCITON, LATICACIDVEN in the last 168 hours.  Recent Results (from the past 240 hour(s))  Resp Panel by RT-PCR (Flu A&B, Covid) Nasopharyngeal Swab     Status: None   Collection Time: 08/10/20 12:08 AM   Specimen: Nasopharyngeal Swab; Nasopharyngeal(NP) swabs in vial transport medium  Result Value Ref Range Status   SARS Coronavirus 2 by RT PCR NEGATIVE NEGATIVE Final    Comment: (NOTE) SARS-CoV-2 target nucleic acids are NOT DETECTED.  The SARS-CoV-2 RNA is generally detectable in upper respiratory specimens during the acute phase of infection. The lowest concentration of SARS-CoV-2 viral copies this  assay can detect is 138 copies/mL. A negative result does not preclude SARS-Cov-2 infection and should not be used as the sole basis for treatment or other patient management decisions. A negative result may occur with  improper specimen collection/handling, submission of specimen other than nasopharyngeal swab, presence of viral mutation(s) within the areas targeted by this assay, and inadequate number of viral copies(<138 copies/mL). A negative result must be combined with clinical observations, patient history, and epidemiological information. The expected result is Negative.  Fact Sheet for Patients:  EntrepreneurPulse.com.au  Fact Sheet for Healthcare Providers:  IncredibleEmployment.be  This test is no t yet approved or cleared by the [place] FDA and  has been authorized for detection and/or diagnosis of SARS-CoV-2 by FDA under an Emergency Use Authorization (EUA). This EUA will remain  in effect (meaning this test can be used) for the duration of  the COVID-19 declaration under Section 564(b)(1) of the Act, 21 U.S.C.section 360bbb-3(b)(1), unless the authorization is terminated  or revoked sooner.       Influenza A by PCR NEGATIVE NEGATIVE Final   Influenza B by PCR NEGATIVE NEGATIVE Final    Comment: (NOTE) The Xpert Xpress SARS-CoV-2/FLU/RSV plus assay is intended as an aid in the diagnosis of influenza from Nasopharyngeal swab specimens and should not be used as a sole basis for treatment. Nasal washings and aspirates are unacceptable for Xpert Xpress SARS-CoV-2/FLU/RSV testing.  Fact Sheet for Patients: EntrepreneurPulse.com.au  Fact Sheet for Healthcare Providers: IncredibleEmployment.be  This test is not yet approved or cleared by the [place] FDA and has been authorized for detection and/or diagnosis of SARS-CoV-2 by FDA under an Emergency Use Authorization (EUA). This EUA will remain in effect (meaning this test can be used) for the duration of the COVID-19 declaration under Section 564(b)(1) of the Act, 21 U.S.C. section 360bbb-3(b)(1), unless the authorization is terminated or revoked.  Performed at [facility], [address] [place], [place] [address]   MRSA PCR Screening     Status: None   Collection Time: 08/11/20  2:17 AM   Specimen: Nasopharyngeal  Result Value Ref Range Status   MRSA by PCR NEGATIVE NEGATIVE Final    Comment:        The GeneXpert MRSA Assay (FDA approved for NASAL specimens only), is one component of a comprehensive MRSA colonization surveillance program. It is not intended to diagnose MRSA infection nor to guide or monitor treatment for MRSA infections. Performed at [facility], [address] [place], [place] [address]      Radiology Studies: CT HEAD WO CONTRAST  Result Date: 08/11/2020 CLINICAL DATA:  Altered mental status EXAM: CT HEAD WITHOUT CONTRAST TECHNIQUE: Contiguous axial images were obtained from  the base of the skull through the vertex without intravenous contrast. COMPARISON:  CT head 07/10/2020 FINDINGS: Brain: Generalized atrophy unchanged. Patchy white matter hypodensity diffusely also unchanged. Small chronic infarct right thalamus unchanged. Negative for acute infarct, hemorrhage, mass Vascular: Negative for hyperdense vessel Skull: Negative Sinuses/Orbits: Mucosal edema paranasal sinuses most prominent in the right maxillary sinus. Bilateral cataract extraction Other: None IMPRESSION: No acute abnormality no change from the prior CT Atrophy and chronic ischemic changes. Electronically Signed   By: Franchot Gallo M.D.   On: 08/11/2020 12:09   DG Chest Portable 1 View  Result Date: 08/10/2020 CLINICAL DATA:  Altered mental status EXAM: PORTABLE CHEST 1 VIEW COMPARISON:  07/15/2020 FINDINGS: The heart size and mediastinal contours are within normal limits. Aortic atherosclerosis. Low lung volumes.  Surgical hardware in the lower spine both lungs are clear. The visualized skeletal structures are unremarkable. IMPRESSION: No active disease. Electronically Signed   By: Donavan Foil M.D.   On: 08/10/2020 23:17    Scheduled Meds: . Chlorhexidine Gluconate Cloth  6 each Topical Daily  . insulin aspart  0-15 Units Subcutaneous Q4H  . insulin glargine  12 Units Subcutaneous Q24H  . lactulose  10 g Oral BID   Continuous Infusions: . sodium chloride 100 mL/hr at 08/12/20 0200  . sodium chloride    . dexmedetomidine (PRECEDEX) IV infusion Stopped (08/12/20 0742)  . norepinephrine (LEVOPHED) Adult infusion       LOS: 1 day   Time spent: 40 minutes. More than 50% of the time was spent in counseling/coordination of care  Lorella Nimrod, MD [facility]  If 7PM-7AM, please contact night-coverage [email or web address]  08/12/2020, 5:56 PM   This record has been created using Systems analyst. Errors have been sought and corrected,but may not always be located. Such creation  errors do not reflect on the standard of care.

## 2020-08-13 LAB — BASIC METABOLIC PANEL
Anion gap: 6 (ref 5–15)
BUN: 15 mg/dL (ref 8–23)
CO2: 23 mmol/L (ref 22–32)
Calcium: 8.7 mg/dL — ABNORMAL LOW (ref 8.9–10.3)
Chloride: 108 mmol/L (ref 98–111)
Creatinine, Ser: 1.31 mg/dL — ABNORMAL HIGH (ref 0.61–1.24)
GFR, Estimated: 58 mL/min — ABNORMAL LOW (ref >60)
Glucose, Bld: 187 mg/dL — ABNORMAL HIGH (ref 70–99)
Potassium: 4.1 mmol/L (ref 3.5–5.1)
Sodium: 137 mmol/L (ref 135–145)

## 2020-08-13 LAB — PROTIME-INR
INR: 1.2 (ref 0.8–1.2)
Prothrombin Time: 15.5 seconds — ABNORMAL HIGH (ref 11.4–15.2)

## 2020-08-13 LAB — GLUCOSE, CAPILLARY
Glucose-Capillary: 108 mg/dL — ABNORMAL HIGH (ref 70–99)
Glucose-Capillary: 125 mg/dL — ABNORMAL HIGH (ref 70–99)
Glucose-Capillary: 132 mg/dL — ABNORMAL HIGH (ref 70–99)
Glucose-Capillary: 175 mg/dL — ABNORMAL HIGH (ref 70–99)
Glucose-Capillary: 213 mg/dL — ABNORMAL HIGH (ref 70–99)
Glucose-Capillary: 216 mg/dL — ABNORMAL HIGH (ref 70–99)
Glucose-Capillary: 299 mg/dL — ABNORMAL HIGH (ref 70–99)

## 2020-08-13 MED ORDER — ACETAMINOPHEN 325 MG PO TABS
650.0000 mg | ORAL_TABLET | Freq: Four times a day (QID) | ORAL | Status: DC | PRN
  Administered 2020-08-15 – 2020-08-19 (×3): 650 mg via ORAL
  Filled 2020-08-13 (×3): qty 2

## 2020-08-13 MED ORDER — WARFARIN SODIUM 4 MG PO TABS
4.0000 mg | ORAL_TABLET | Freq: Once | ORAL | Status: AC
  Administered 2020-08-13: 4 mg via ORAL
  Filled 2020-08-13: qty 1

## 2020-08-13 MED ORDER — SODIUM CHLORIDE 0.9% FLUSH
10.0000 mL | Freq: Two times a day (BID) | INTRAVENOUS | Status: DC
  Administered 2020-08-13 – 2020-08-20 (×14): 10 mL via INTRAVENOUS

## 2020-08-13 NOTE — Evaluation (Signed)
Physical Therapy Evaluation Patient Details Name: Hector Mueller MRN: [id number] DOB: 1950/08/25 Today's Date: 08/13/2020   History of Present Illness  Pt admitted for DM with hypersomolar hyperglycemic state with complaints of AMS. History includes liver cirrhosis s/p liver transplant, DM, BPH, and CHF.  Clinical Impression  Pt is a pleasant 70 year old male who was admitted for DM with hyperosmolar hyperglycemic state. Recent admission (2 in last 6 months) and reports he is becoming weaker and isn't at baseline. Pt performs bed mobility with min assist, transfers with mod assist, and ambulation with min assist and RW. +2 for safety as pt fatigues quickly. Takes 2 attempts to stand with quick eccentric control. 2nd person and mod assist with successful standing. Pt demonstrates deficits with strength/mobility/endurance. Doesn't appear to be at baseline level, however reports he plans to go home as he already has HHPT established. Would benefit from skilled PT to address above deficits and promote optimal return to PLOF; recommend transition to STR upon discharge from acute hospitalization.     Follow Up Recommendations SNF (however pt only agreeable to home-has HH established)    Equipment Recommendations  None recommended by PT    Recommendations for Other Services       Precautions / Restrictions Precautions Precautions: Fall Restrictions Weight Bearing Restrictions: No      Mobility  Bed Mobility Overal bed mobility: Needs Assistance Bed Mobility: Supine to Sit     Supine to sit: Min guard Sit to supine: Min assist   General bed mobility comments: needs assist for B LEs. Once seated, able to sit with upright posture. fatigues quickly    Transfers Overall transfer level: Needs assistance Equipment used: Rolling walker (2 wheeled) Transfers: Sit to/from Stand Sit to Stand: Mod assist;+2 physical assistance         General transfer comment: needs assist and powers up  very slowly. once standing, slightly flexed posture noted. RW used.  Ambulation/Gait Ambulation/Gait assistance: Min assist;+2 physical assistance Gait Distance (Feet): 30 Feet Assistive device: Rolling walker (2 wheeled) Gait Pattern/deviations: Step-to pattern     General Gait Details: ambulated in room with +2 due to unsteadiness. RW used with cues for sequencing and safe technique. Fatigues quickly with exertion  Stairs            Wheelchair Mobility    Modified Rankin (Stroke Patients Only)       Balance Overall balance assessment: Needs assistance Sitting-balance support: Feet supported Sitting balance-Leahy Scale: Good     Standing balance support: Bilateral upper extremity supported;During functional activity Standing balance-Leahy Scale: Fair                               Pertinent Vitals/Pain Pain Assessment: No/denies pain    Home Living Family/patient expects to be discharged to:: Private residence Living Arrangements: Spouse/significant other (and grandson) Available Help at Discharge: Family;Available 24 hours/day Type of Home: House Home Access: Stairs to enter Entrance Stairs-Rails: Left Entrance Stairs-Number of Steps: 7 Home Layout: One level Home Equipment: Clinical cytogeneticist - 2 wheels;Walker - 4 wheels;Cane - single point      Prior Function Level of Independence: Independent with assistive device(s)         Comments: reports he typically uses SPC in community but rollator in home. Reports recent falls.     Hand Dominance        Extremity/Trunk Assessment   Upper Extremity Assessment Upper Extremity Assessment: Generalized weakness (  B UE grossly 4/5)    Lower Extremity Assessment Lower Extremity Assessment: Generalized weakness (B LE grossly 3+/5)       Communication   Communication: No difficulties  Cognition Arousal/Alertness: Awake/alert Behavior During Therapy: WFL for tasks assessed/performed Overall  Cognitive Status: Within Functional Limits for tasks assessed                                        General Comments      Exercises Other Exercises Other Exercises: supine/seated ther-ex performed on B LE including SLRs, quad sets, and LAQ. 10 reps with cga. Cues given for sequencing   Assessment/Plan    PT Assessment Patient needs continued PT services  PT Problem List Decreased strength;Decreased mobility;Decreased activity tolerance;Decreased balance;Pain       PT Treatment Interventions DME instruction;Therapeutic exercise;Gait training;Balance training;Functional mobility training;Therapeutic activities;Patient/family education    PT Goals (Current goals can be found in the Care Plan section)  Acute Rehab PT Goals Patient Stated Goal: to get better, go home PT Goal Formulation: With patient Time For Goal Achievement: 08/27/20 Potential to Achieve Goals: Good    Frequency Min 2X/week   Barriers to discharge        Co-evaluation               AM-PAC PT "6 Clicks" Mobility  Outcome Measure Help needed turning from your back to your side while in a flat bed without using bedrails?: None Help needed moving from lying on your back to sitting on the side of a flat bed without using bedrails?: A Little Help needed moving to and from a bed to a chair (including a wheelchair)?: A Lot Help needed standing up from a chair using your arms (e.g., wheelchair or bedside chair)?: A Lot Help needed to walk in hospital room?: A Lot Help needed climbing 3-5 steps with a railing? : Total 6 Click Score: 14    End of Session Equipment Utilized During Treatment: Gait belt Activity Tolerance: Patient tolerated treatment well Patient left: in bed;with bed alarm set Nurse Communication: Mobility status PT Visit Diagnosis: Other abnormalities of gait and mobility (R26.89);Muscle weakness (generalized) (M62.81);Unsteadiness on feet (R26.81);Difficulty in walking, not  elsewhere classified (R26.2);Pain    Time: 8206-0156 PT Time Calculation (min) (ACUTE ONLY): 21 min   Charges:   PT Evaluation $PT Eval Low Complexity: 1 Low PT Treatments $Therapeutic Exercise: 8-22 mins        Greggory Stallion, PT, DPT [phone]   Eithel Ryall 08/13/2020, 4:29 PM

## 2020-08-13 NOTE — Progress Notes (Signed)
PROGRESS NOTE    Hector Mueller  HBZ:[id number] DOB: 02-Jun-1950 DOA: 08/10/2020 PCP: [name], MD   Brief Narrative: Taken from H&P and prior notes. Hector Mueller is a 70 y.o. 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 Found to have CBG of 859, admitted for hyperosmolar hyperglycemia, initially managed with insulin infusion. Due to worsening agitation he was started on Precedex infusion, later transitioned off with improvement in mental status and agitation. Significantly improved-PT is recommending SNF placement.  Subjective: Patient was seen and examined today.  Eating and drinking okay, no new complaint.  Appears euvolemic.  Assessment & Plan:   Principal Problem:   Type 2 diabetes mellitus with hyperosmolar hyperglycemic state (HHS) ([place]) Active Problems:   Atrial fibrillation (HCC)   HFrEF (heart failure with reduced ejection fraction) (HCC)   Hx of liver transplant ([place])   CKD (chronic kidney disease), stage III (HCC)  Acute metabolic encephalopathy.  Resolved  Most likely related to HHS, became quite agitated and somnolent.  PCO2 normal on ABG.  TSH mildly elevated at 4.5, ammonia levels within normal limit, UA with some proteinuria but no pyuria or bacteriuria, UDS negative. Precedex infusion has been discontinued appears at baseline now. -Continue to monitor  Paroxysmal atrial fibrillation.  Currently in sinus rhythm. -Continue home metoprolol -Continue home Coumadin-INR was subtherapeutic not sure whether patient was taking it, per patient he was missing doses as keep forgetting to take his  medications.  Uncontrolled type 2 diabetes mellitus with HHS.  CBG currently within goal. -Continue with SSI -Continue Lantus 12 units daily  CKD stage IIIb.  Creatinine seems improved from his baseline with IV fluid. -Discontinue IV fluid now -Monitor renal function -Avoid nephrotoxins  History of liver cirrhosis secondary to hep C s/p liver transplant in 2018. Ammonia levels within normal limit and LFTs stable. -Continue home meds.  Anemia of chronic disease/thrombocytopenia.  Seems stable. -Continue to monitor  Chronic HFrEF with right heart failure/pulmonary hypertension.  Appears well compensated. -Continue home meds -Daily weight and BMP  Objective: Vitals:   08/13/20 1300 08/13/20 1400 08/13/20 1500 08/13/20 1600  BP:    (!) 134/59  Pulse: 70 73 74 66  Resp: 19 14 17 17   Temp:      TempSrc:      SpO2: 97% 98% 97% 96%  Weight:      Height:        Intake/Output Summary (Last 24 hours) at 08/13/2020 1653 Last data filed at 08/13/2020 1212 Gross per 24 hour  Intake 2757.38 ml  Output 1575 ml  Net 1182.38 ml   Filed Weights   08/11/20 0227 08/12/20 0411 08/13/20 0402  Weight: 68.2 kg 72.1 kg 66.7 kg    Examination:  General.  Frail elderly man, in no acute distress. Pulmonary.  Lungs clear bilaterally, normal respiratory effort. CV.  Regular rate and rhythm, no JVD, rub or murmur. Abdomen.  Soft, nontender, nondistended, BS positive. CNS.  Alert and oriented x3.  No focal neurologic deficit. Extremities.  No edema, no cyanosis, pulses intact and symmetrical. Psychiatry.  Judgment and insight appears normal.  DVT prophylaxis: Coumadin Code Status: Full Family Communication: Discussed with patient Disposition Plan:  Status is: Inpatient  Remains inpatient appropriate because:Inpatient level of care appropriate due to severity of illness   Dispo: The patient is from: Home              Anticipated d/c is to: Home              Patient currently is not  medically stable to d/c.   Difficult to place patient No              Level of care: Med-Surg  All the records are reviewed and case discussed with Care Management/Social Worker. Management plans discussed with the patient, nursing and they are in agreement.  Consultants:   PCCM  Procedures:  Antimicrobials:   Data Reviewed: I have personally reviewed following labs and imaging studies  CBC: Recent Labs  Lab 08/10/20 2251 08/11/20 0304 08/12/20 0336  WBC 5.0 7.8 4.2  NEUTROABS 3.6  --   --   HGB 11.9* 12.7* 9.8*  HCT 35.7* 36.5* 28.9*  MCV 91.8 89.0 92.3  PLT 127* 138* 97*   Basic Metabolic Panel: Recent Labs  Lab 08/10/20 2251 08/11/20 0304 08/11/20 0756 08/11/20 1141 08/12/20 0336 08/13/20 0322  NA 124* 135 134*  --  137 137  K 5.3* 3.8 3.7  --  3.6 4.1  CL 83* 94* 94*  --  106 108  CO2 25 27 26   --  25 23  GLUCOSE 859* 256* 165*  --  110* 187*  BUN 39* 34* 30*  --  20 15  CREATININE 2.20* 2.08* 1.74*  --  1.43* 1.31*  CALCIUM 9.7 10.2 9.7  --  8.4* 8.7*  MG  --   --   --  2.2  --   --    GFR: Estimated Creatinine Clearance: 48.1 mL/min (A) (by C-G formula based on SCr of 1.31 mg/dL (H)). Liver Function Tests: Recent Labs  Lab 08/10/20 2251  AST 12*  ALT 13  ALKPHOS 88  BILITOT 1.0  PROT 6.9  ALBUMIN 3.8   No results for input(s): LIPASE, AMYLASE in the last 168 hours. Recent Labs  Lab 08/11/20 1141  AMMONIA 17   Coagulation Profile: Recent Labs  Lab 08/11/20 0304 08/13/20 0322  INR 1.1 1.2   Cardiac Enzymes: No results for input(s): CKTOTAL, CKMB, CKMBINDEX, TROPONINI in the last 168 hours. BNP (last 3 results) No results for input(s): PROBNP in the last 8760 hours. HbA1C: No results for input(s): HGBA1C in the last 72 hours. CBG: Recent Labs  Lab 08/13/20 0110 08/13/20 0356 08/13/20 0732 08/13/20 1150 08/13/20 1558  GLUCAP 108* 175* 125* 216* 213*   Lipid Profile: No results for input(s): CHOL, HDL, LDLCALC, TRIG, CHOLHDL,  LDLDIRECT in the last 72 hours. Thyroid Function Tests: Recent Labs    08/11/20 1141  TSH 4.580*   Anemia Panel: No results for input(s): VITAMINB12, FOLATE, FERRITIN, TIBC, IRON, RETICCTPCT in the last 72 hours. Sepsis Labs: No results for input(s): PROCALCITON, LATICACIDVEN in the last 168 hours.  Recent Results (from the past 240 hour(s))  Resp Panel by RT-PCR (Flu A&B, Covid) Nasopharyngeal Swab     Status: None   Collection Time: 08/10/20 12:08 AM   Specimen: Nasopharyngeal Swab; Nasopharyngeal(NP) swabs in vial transport medium  Result Value Ref Range Status   SARS Coronavirus 2 by RT PCR NEGATIVE NEGATIVE Final    Comment: (NOTE) SARS-CoV-2 target nucleic acids are NOT DETECTED.  The SARS-CoV-2 RNA is generally detectable in upper respiratory specimens during the acute phase of infection. The lowest  concentration of SARS-CoV-2 viral copies this assay can detect is 138 copies/mL. A negative result does not preclude SARS-Cov-2 infection and should not be used as the sole basis for treatment or other patient management decisions. A negative result may occur with  improper specimen collection/handling, submission of specimen other than nasopharyngeal swab, presence of viral mutation(s) within the areas targeted by this assay, and inadequate number of viral copies(<138 copies/mL). A negative result must be combined with clinical observations, patient history, and epidemiological information. The expected result is Negative.  Fact Sheet for Patients:  EntrepreneurPulse.com.au  Fact Sheet for Healthcare Providers:  IncredibleEmployment.be  This test is no t yet approved or cleared by the [place] FDA and  has been authorized for detection and/or diagnosis of SARS-CoV-2 by FDA under an Emergency Use Authorization (EUA). This EUA will remain  in effect (meaning this test can be used) for the duration of the COVID-19 declaration under  Section 564(b)(1) of the Act, 21 U.S.C.section 360bbb-3(b)(1), unless the authorization is terminated  or revoked sooner.       Influenza A by PCR NEGATIVE NEGATIVE Final   Influenza B by PCR NEGATIVE NEGATIVE Final    Comment: (NOTE) The Xpert Xpress SARS-CoV-2/FLU/RSV plus assay is intended as an aid in the diagnosis of influenza from Nasopharyngeal swab specimens and should not be used as a sole basis for treatment. Nasal washings and aspirates are unacceptable for Xpert Xpress SARS-CoV-2/FLU/RSV testing.  Fact Sheet for Patients: EntrepreneurPulse.com.au  Fact Sheet for Healthcare Providers: IncredibleEmployment.be  This test is not yet approved or cleared by the [place] FDA and has been authorized for detection and/or diagnosis of SARS-CoV-2 by FDA under an Emergency Use Authorization (EUA). This EUA will remain in effect (meaning this test can be used) for the duration of the COVID-19 declaration under Section 564(b)(1) of the Act, 21 U.S.C. section 360bbb-3(b)(1), unless the authorization is terminated or revoked.  Performed at [facility], [address] [place], [place] [address]   MRSA PCR Screening     Status: None   Collection Time: 08/11/20  2:17 AM   Specimen: Nasopharyngeal  Result Value Ref Range Status   MRSA by PCR NEGATIVE NEGATIVE Final    Comment:        The GeneXpert MRSA Assay (FDA approved for NASAL specimens only), is one component of a comprehensive MRSA colonization surveillance program. It is not intended to diagnose MRSA infection nor to guide or monitor treatment for MRSA infections. Performed at [facility], [address] [place],  [address]      Radiology Studies: No results found.  Scheduled Meds: . amiodarone  200 mg Oral Daily  . Chlorhexidine Gluconate Cloth  6 each Topical Daily  . cycloSPORINE modified  200 mg Oral Daily  . insulin aspart  0-15 Units  Subcutaneous Q4H  . insulin glargine  12 Units Subcutaneous Q24H  . lactulose  10 g Oral BID  . melatonin  2.5 mg Oral QHS  . metoprolol succinate  25 mg Oral BID  . mirtazapine  15 mg Oral QHS  . mycophenolate  500 mg Oral BID  . pantoprazole  40 mg Oral Daily  . rosuvastatin  5 mg Oral Daily  . sildenafil  40 mg Oral TID  . sodium bicarbonate  650 mg Oral BID  . Warfarin - Pharmacist Dosing Inpatient   Does not apply q1600   Continuous Infusions: . sodium chloride    . dexmedetomidine (PRECEDEX) IV infusion Stopped (08/12/20  6720)     LOS: 2 days   Time spent: 30 minutes. More than 50% of the time was spent in counseling/coordination of care  Lorella Nimrod, MD [facility]  If 7PM-7AM, please contact night-coverage [email or web address]  08/13/2020, 4:53 PM   This record has been created using Systems analyst. Errors have been sought and corrected,but may not always be located. Such creation errors do not reflect on the standard of care.

## 2020-08-13 NOTE — Consult Note (Signed)
[facility] for Warfarin Dosing Indication: atrial fibrillation  Patient Measurements: Height: 5\' 9"  (175.3 cm) Weight: 66.7 kg (147 lb 0.8 oz) IBW/kg (Calculated) : 70.7  Labs: Recent Labs    08/10/20 2251 08/11/20 0304 08/11/20 0756 08/12/20 0336 08/13/20 0322  HGB 11.9* 12.7*  --  9.8*  --   HCT 35.7* 36.5*  --  28.9*  --   PLT 127* 138*  --  97*  --   LABPROT  --  14.5  --   --  15.5*  INR  --  1.1  --   --  1.2  CREATININE 2.20* 2.08* 1.74* 1.43* 1.31*    Estimated Creatinine Clearance: 48.1 mL/min (A) (by C-G formula based on SCr of 1.31 mg/dL (H)).   Medical History: Past Medical History:  Diagnosis Date  . Atrial fibrillation ([place])   . BPH (benign prostatic hyperplasia)   . Cancer ([place])    liver  . CHF (congestive heart failure) ([place])   . Chronic kidney disease   . Cirrhosis ([place])   . Diabetes mellitus without complication ([place])   . GERD (gastroesophageal reflux disease)   . Hepatitis C   . Hx of liver transplant ([place])   . Pulmonary HTN ([place])     Assessment: Pharmacy has been consulted for Warfarin dosing and monitoring in 70yo patient with history of Atrial Fibrillation. Patient has been taking warfarin PTA with their home dose being 2.5mg  daily. 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  DDI: amiodarone and cyclosporine  Date INR Plan  5/31 1.1 Held  6/1 N/A 3.75 mg  6/2 1.2 4 mg    Goal of Therapy:  INR 2-3   Plan:  --INR is subtherapeutic. Will give warfarin 4 mg tonight --INR daily --CBC at least every 3 days per protocol   Benita Gutter 08/13/2020,7:16 AM

## 2020-08-14 ENCOUNTER — Inpatient Hospital Stay: Payer: Medicare Other

## 2020-08-14 LAB — GLUCOSE, CAPILLARY
Glucose-Capillary: 86 mg/dL (ref 70–99)
Glucose-Capillary: 130 mg/dL — ABNORMAL HIGH (ref 70–99)
Glucose-Capillary: 207 mg/dL — ABNORMAL HIGH (ref 70–99)
Glucose-Capillary: 249 mg/dL — ABNORMAL HIGH (ref 70–99)
Glucose-Capillary: 217 mg/dL — ABNORMAL HIGH (ref 70–99)

## 2020-08-14 LAB — BASIC METABOLIC PANEL
Anion gap: 8 (ref 5–15)
BUN: 13 mg/dL (ref 8–23)
CO2: 22 mmol/L (ref 22–32)
Calcium: 8.7 mg/dL — ABNORMAL LOW (ref 8.9–10.3)
Chloride: 108 mmol/L (ref 98–111)
Creatinine, Ser: 1.43 mg/dL — ABNORMAL HIGH (ref 0.61–1.24)
GFR, Estimated: 52 mL/min — ABNORMAL LOW (ref >60)
Glucose, Bld: 110 mg/dL — ABNORMAL HIGH (ref 70–99)
Potassium: 3.6 mmol/L (ref 3.5–5.1)
Sodium: 138 mmol/L (ref 135–145)

## 2020-08-14 LAB — PROTIME-INR
INR: 1.6 — ABNORMAL HIGH (ref 0.8–1.2)
Prothrombin Time: 19.1 seconds — ABNORMAL HIGH (ref 11.4–15.2)

## 2020-08-14 MED ORDER — DM-GUAIFENESIN ER 30-600 MG PO TB12
1.0000 | ORAL_TABLET | Freq: Two times a day (BID) | ORAL | Status: DC
  Administered 2020-08-14 – 2020-08-20 (×12): 1 via ORAL
  Filled 2020-08-14 (×12): qty 1

## 2020-08-14 MED ORDER — WARFARIN SODIUM 5 MG PO TABS
5.0000 mg | ORAL_TABLET | Freq: Once | ORAL | Status: AC
  Administered 2020-08-14: 5 mg via ORAL
  Filled 2020-08-14: qty 1

## 2020-08-14 MED ORDER — POLYVINYL ALCOHOL 1.4 % OP SOLN
1.0000 [drp] | OPHTHALMIC | Status: DC | PRN
  Administered 2020-08-14 – 2020-08-15 (×2): 1 [drp] via OPHTHALMIC
  Filled 2020-08-14 (×2): qty 15

## 2020-08-14 MED ORDER — DULOXETINE HCL 30 MG PO CPEP
60.0000 mg | ORAL_CAPSULE | Freq: Every day | ORAL | Status: DC
  Administered 2020-08-14 – 2020-08-19 (×6): 60 mg via ORAL
  Filled 2020-08-14 (×6): qty 2

## 2020-08-14 NOTE — Consult Note (Signed)
[facility] for Warfarin Dosing Indication: atrial fibrillation  Patient Measurements: Height: 5\' 9"  (175.3 cm) Weight: 66.7 kg (147 lb 0.8 oz) IBW/kg (Calculated) : 70.7  Labs: Recent Labs    08/12/20 0336 08/13/20 0322 08/14/20 0531  HGB 9.8*  --   --   HCT 28.9*  --   --   PLT 97*  --   --   LABPROT  --  15.5* 19.1*  INR  --  1.2 1.6*  CREATININE 1.43* 1.31* 1.43*    Estimated Creatinine Clearance: 44.1 mL/min (A) (by C-G formula based on SCr of 1.43 mg/dL (H)).   Medical History: Past Medical History:  Diagnosis Date  . Atrial fibrillation ([place])   . BPH (benign prostatic hyperplasia)   . Cancer ([place])    liver  . CHF (congestive heart failure) ([place])   . Chronic kidney disease   . Cirrhosis ([place])   . Diabetes mellitus without complication ([place])   . GERD (gastroesophageal reflux disease)   . Hepatitis C   . Hx of liver transplant ([place])   . Pulmonary HTN ([place])     Assessment: Pharmacy has been consulted for Warfarin dosing and monitoring in 70yo patient with history of Atrial Fibrillation. Patient has been taking warfarin PTA with their home dose being 2.5mg  daily. 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  DDI: amiodarone and cyclosporine  Date INR Plan  5/31 1.1 Held  6/1 N/A 3.75 mg  6/2 1.2 4 mg  6/3 1.6 5 mg    Goal of Therapy:  INR 2-3   Plan:  --INR is subtherapeutic. Will give warfarin 5 mg tonight (dosing decision made based on CHG point system & dosing history for the pt) --INR daily --CBC at least every 3 days per protocol   Geraldo Docker 08/14/2020,8:11 AM

## 2020-08-14 NOTE — Care Management Important Message (Signed)
Important Message  Patient Details  Name: Hector Mueller MRN: [id number] Date of Birth: 06-18-1950   Medicare Important Message Given:  Yes     [name] 08/14/2020, 10:33 AM

## 2020-08-14 NOTE — Progress Notes (Signed)
PT Cancellation Note  Patient Details Name: Hector Mueller MRN: [id number] DOB: 11/11/1950   Cancelled Treatment:    Reason Eval/Treat Not Completed: Other (comment)   Pt with large soft BM in bed asking for bedpan.  Encouraged OOB to commode to complete BM and bathing/linen change which will be necessary.  He refused asking for bedpan again.  Given and unit secretary aware as he is wanting time.  Pt to call when finished.   Chesley Noon 08/14/2020, 3:53 PM

## 2020-08-14 NOTE — Progress Notes (Signed)
PROGRESS NOTE    Hector Mueller  OEV:[id number] DOB: Feb 08, 1951 DOA: 08/10/2020 PCP: [name], MD   Brief Narrative: Taken from H&P and prior notes. Hector Mueller is a 70 y.o. 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 Found to have CBG of 859, admitted for hyperosmolar hyperglycemia, initially managed with insulin infusion. Due to worsening agitation he was started on Precedex infusion, later transitioned off with improvement in mental status and agitation. Significantly improved-PT is recommending SNF placement.  Subjective: Patient was complaining of dysphagia and food sticking in his chest and causing pain.  Per patient he is unable to eat due to that discomfort.  When asked when it started, stating that since he is in hospital??  No complaint prior to today.  Assessment & Plan:   Principal Problem:   Type 2 diabetes mellitus with hyperosmolar hyperglycemic state (HHS) ([place]) Active Problems:   Atrial fibrillation (HCC)   HFrEF (heart failure with reduced ejection fraction) (HCC)   Hx of liver transplant ([place])   CKD (chronic kidney disease), stage III (HCC)  Acute metabolic encephalopathy.  Resolved  Most likely related to HHS, became quite agitated and somnolent.  PCO2 normal on ABG.  TSH mildly elevated at 4.5, ammonia levels within normal limit, UA with some proteinuria but no pyuria or bacteriuria, UDS negative. Precedex infusion has been discontinued appears at baseline now. -Continue to monitor  Dysphagia.  Patient's symptoms were concerning for dysphagia, esophageal gram was obtained which shows distal  esophageal narrowing and inability to pass pill and they were recommending endoscopic evaluation. -GI consult  Paroxysmal atrial fibrillation.  Currently in sinus rhythm. -Continue home metoprolol -Continue home Coumadin-INR was subtherapeutic not sure whether patient was taking it, per patient he was missing doses as keep forgetting to take his medications.  Uncontrolled type 2 diabetes mellitus with HHS.  CBG currently within goal. -Continue with SSI -Continue Lantus 12 units daily  CKD stage IIIb.  Creatinine seems improved from his baseline with IV fluid. IV fluids were discontinued yesterday. -Monitor renal function -Avoid nephrotoxins  History of liver cirrhosis secondary to hep C s/p liver transplant in 2018. Ammonia levels within normal limit and LFTs stable. -Continue home meds.  Anemia of chronic disease/thrombocytopenia.  Seems stable. -Continue to monitor  Chronic HFrEF with right heart failure/pulmonary hypertension.  Appears well compensated. -Continue home meds -Daily weight and BMP  Objective: Vitals:   08/14/20 0423 08/14/20 0858 08/14/20 1124 08/14/20 1537  BP: (!) 145/69 128/68 128/62 135/74  Pulse: 71 72 67 71  Resp: 17 16 18 18   Temp: 98 F (36.7 C) 99.6 F (37.6 C) 98.8 F (37.1 C) 98.8 F (37.1 C)  TempSrc: Oral Oral    SpO2: 100% 96% 96% 98%  Weight:      Height:        Intake/Output Summary (Last 24 hours) at 08/14/2020 1649 Last data filed at 08/14/2020 1300 Gross per 24 hour  Intake 485 ml  Output 1000 ml  Net -515 ml   Filed Weights   08/11/20 0227 08/12/20 0411 08/13/20 0402  Weight: 68.2 kg 72.1 kg 66.7 kg    Examination:  General.  Elderly gentleman, in no acute distress. Pulmonary.  Lungs clear bilaterally, normal respiratory effort. CV.  Regular rate and rhythm, no JVD, rub or murmur. Abdomen.  Soft, nontender, nondistended, BS positive. CNS.  Alert and oriented .  No focal neurologic deficit. Extremities.  No edema, no  cyanosis, pulses intact and symmetrical. Psychiatry.  Judgment and insight appears normal.  DVT prophylaxis: Coumadin Code Status: Full Family Communication: Discussed with patient Disposition Plan:  Status is: Inpatient  Remains inpatient appropriate because:Inpatient level of care appropriate due to severity of illness   Dispo: The patient is from: Home              Anticipated d/c is to: Home              Patient currently is not medically stable to d/c.   Difficult to place patient No              Level of care: Med-Surg  All the records are reviewed and case discussed with Care Management/Social Worker. Management plans discussed with the patient, nursing and they are in agreement.  Consultants:   PCCM  GI  Procedures:  Antimicrobials:   Data Reviewed: I have personally reviewed following labs and imaging studies  CBC: Recent Labs  Lab 08/10/20 2251 08/11/20 0304 08/12/20 0336  WBC 5.0 7.8 4.2  NEUTROABS 3.6  --   --   HGB 11.9* 12.7* 9.8*  HCT 35.7* 36.5* 28.9*  MCV 91.8 89.0 92.3  PLT 127* 138* 97*   Basic Metabolic Panel: Recent Labs  Lab 08/11/20 0304 08/11/20 0756 08/11/20 1141 08/12/20 0336 08/13/20 0322 08/14/20 0531  NA 135 134*  --  137 137 138  K 3.8 3.7  --  3.6 4.1 3.6  CL 94* 94*  --  106 108 108  CO2 27 26  --  25 23 22   GLUCOSE 256* 165*  --  110* 187* 110*  BUN 34* 30*  --  20 15 13   CREATININE 2.08* 1.74*  --  1.43* 1.31* 1.43*  CALCIUM 10.2 9.7  --  8.4* 8.7* 8.7*  MG  --   --  2.2  --   --   --    GFR: Estimated Creatinine Clearance: 44.1 mL/min (A) (by C-G formula based on SCr of 1.43 mg/dL (H)). Liver Function Tests: Recent Labs  Lab 08/10/20 2251  AST 12*  ALT 13  ALKPHOS 88  BILITOT 1.0  PROT 6.9  ALBUMIN 3.8   No results for input(s): LIPASE, AMYLASE in the last 168 hours. Recent Labs  Lab 08/11/20 1141  AMMONIA 17   Coagulation Profile: Recent Labs  Lab 08/11/20 0304 08/13/20 0322 08/14/20 0531  INR  1.1 1.2 1.6*   Cardiac Enzymes: No results for input(s): CKTOTAL, CKMB, CKMBINDEX, TROPONINI in the last 168 hours. BNP (last 3 results) No results for input(s): PROBNP in the last 8760 hours. HbA1C: No results for input(s): HGBA1C in the last 72 hours. CBG: Recent Labs  Lab 08/13/20 2350 08/14/20 0424 08/14/20 0716 08/14/20 1123 08/14/20 1535  GLUCAP 132* 86 130* 207* 249*   Lipid Profile: No results for input(s): CHOL, HDL, LDLCALC, TRIG, CHOLHDL, LDLDIRECT in the last 72 hours. Thyroid Function Tests: No results for input(s): TSH, T4TOTAL, FREET4, T3FREE, THYROIDAB in the last 72 hours. Anemia Panel: No results for input(s): VITAMINB12, FOLATE, FERRITIN, TIBC, IRON, RETICCTPCT in the last 72 hours. Sepsis Labs: No results for input(s): PROCALCITON, LATICACIDVEN in the last 168 hours.  Recent Results (from the past 240 hour(s))  Resp Panel by  RT-PCR (Flu A&B, Covid) Nasopharyngeal Swab     Status: None   Collection Time: 08/10/20 12:08 AM   Specimen: Nasopharyngeal Swab; Nasopharyngeal(NP) swabs in vial transport medium  Result Value Ref Range Status   SARS Coronavirus 2 by RT PCR NEGATIVE NEGATIVE Final    Comment: (NOTE) SARS-CoV-2 target nucleic acids are NOT DETECTED.  The SARS-CoV-2 RNA is generally detectable in upper respiratory specimens during the acute phase of infection. The lowest concentration of SARS-CoV-2 viral copies this assay can detect is 138 copies/mL. A negative result does not preclude SARS-Cov-2 infection and should not be used as the sole basis for treatment or other patient management decisions. A negative result may occur with  improper specimen collection/handling, submission of specimen other than nasopharyngeal swab, presence of viral mutation(s) within the areas targeted by this assay, and inadequate number of viral copies(<138 copies/mL). A negative result must be combined with clinical observations, patient history, and  epidemiological information. The expected result is Negative.  Fact Sheet for Patients:  EntrepreneurPulse.com.au  Fact Sheet for Healthcare Providers:  IncredibleEmployment.be  This test is no t yet approved or cleared by the [place] FDA and  has been authorized for detection and/or diagnosis of SARS-CoV-2 by FDA under an Emergency Use Authorization (EUA). This EUA will remain  in effect (meaning this test can be used) for the duration of the COVID-19 declaration under Section 564(b)(1) of the Act, 21 U.S.C.section 360bbb-3(b)(1), unless the authorization is terminated  or revoked sooner.       Influenza A by PCR NEGATIVE NEGATIVE Final   Influenza B by PCR NEGATIVE NEGATIVE Final    Comment: (NOTE) The Xpert Xpress SARS-CoV-2/FLU/RSV plus assay is intended as an aid in the diagnosis of influenza from Nasopharyngeal swab specimens and should not be used as a sole basis for treatment. Nasal washings and aspirates are unacceptable for Xpert Xpress SARS-CoV-2/FLU/RSV testing.  Fact Sheet for Patients: EntrepreneurPulse.com.au  Fact Sheet for Healthcare Providers: IncredibleEmployment.be  This test is not yet approved or cleared by the [place] FDA and has been authorized for detection and/or diagnosis of SARS-CoV-2 by FDA under an Emergency Use Authorization (EUA). This EUA will remain in effect (meaning this test can be used) for the duration of the COVID-19 declaration under Section 564(b)(1) of the Act, 21 U.S.C. section 360bbb-3(b)(1), unless the authorization is terminated or revoked.  Performed at [facility], [address] [place], [place] [address]   MRSA PCR Screening     Status: None   Collection Time: 08/11/20  2:17 AM   Specimen: Nasopharyngeal  Result Value Ref Range Status   MRSA by PCR NEGATIVE NEGATIVE Final    Comment:        The GeneXpert MRSA Assay  (FDA approved for NASAL specimens only), is one component of a comprehensive MRSA colonization surveillance program. It is not intended to diagnose MRSA infection nor to guide or monitor treatment for MRSA infections. Performed at [facility], [address] [place],  [address]      Radiology Studies: DG ESOPHAGUS W DOUBLE CM (HD)  Result Date: 08/14/2020 CLINICAL DATA:  Difficulty swallowing, pills getting stuck EXAM: ESOPHOGRAM/BARIUM SWALLOW TECHNIQUE: Single contrast examination was performed using thin barium soluble. The patient was observed with fluoroscopy swallowing a 13 mm barium sulphate tablet. FLUOROSCOPY TIME:  Fluoroscopy Time:  2 minutes 12 seconds Radiation Exposure Index (if provided by the fluoroscopic device): 19.6 mGy COMPARISON:  None. FINDINGS: Somewhat technically limited by patient positioning. Normal  pharyngeal contours. There is transient penetration but no aspiration. Normal esophageal motility. Relative narrowing of the distal esophagus without holdup of contrast. However, barium pill was held up at this location despite multiple swallows of water. Gastroesophageal reflux is present. No hiatal hernia. IMPRESSION: Relative narrowing of the distal esophagus. This did not holdup passage of contrast but did prevent passage of barium pill despite several swallows of water. Consider endoscopic evaluation. Gastrostomy of reflux. Electronically Signed   By: Macy Mis M.D.   On: 08/14/2020 13:00    Scheduled Meds: . amiodarone  200 mg Oral Daily  . Chlorhexidine Gluconate Cloth  6 each Topical Daily  . cycloSPORINE modified  200 mg Oral Daily  . dextromethorphan-guaiFENesin  1 tablet Oral BID  . DULoxetine  60 mg Oral QHS  . insulin aspart  0-15 Units Subcutaneous Q4H  . insulin glargine  12 Units Subcutaneous Q24H  . lactulose  10 g Oral BID  . melatonin  2.5 mg Oral QHS  . metoprolol succinate  25 mg Oral BID  . mirtazapine  15 mg Oral QHS   . mycophenolate  500 mg Oral BID  . pantoprazole  40 mg Oral Daily  . rosuvastatin  5 mg Oral Daily  . sildenafil  40 mg Oral TID  . sodium bicarbonate  650 mg Oral BID  . sodium chloride flush  10 mL Intravenous Q12H  . Warfarin - Pharmacist Dosing Inpatient   Does not apply q1600   Continuous Infusions: . sodium chloride       LOS: 3 days   Time spent: 30 minutes. More than 50% of the time was spent in counseling/coordination of care  Lorella Nimrod, MD [facility]  If 7PM-7AM, please contact night-coverage [email or web address]  08/14/2020, 4:49 PM   This record has been created using Systems analyst. Errors have been sought and corrected,but may not always be located. Such creation errors do not reflect on the standard of care.

## 2020-08-15 DIAGNOSIS — J3800 Paralysis of vocal cords and larynx, unspecified: Secondary | ICD-10-CM

## 2020-08-15 LAB — PROTIME-INR
INR: 1.9 — ABNORMAL HIGH (ref 0.8–1.2)
Prothrombin Time: 22.1 seconds — ABNORMAL HIGH (ref 11.4–15.2)

## 2020-08-15 LAB — CBC
HCT: 32.6 % — ABNORMAL LOW (ref 39.0–52.0)
Hemoglobin: 11.1 g/dL — ABNORMAL LOW (ref 13.0–17.0)
MCH: 30.9 pg (ref 26.0–34.0)
MCHC: 34 g/dL (ref 30.0–36.0)
MCV: 90.8 fL (ref 80.0–100.0)
Platelets: 107 10*3/uL — ABNORMAL LOW (ref 150–400)
RBC: 3.59 MIL/uL — ABNORMAL LOW (ref 4.22–5.81)
RDW: 14.1 % (ref 11.5–15.5)
WBC: 5.3 10*3/uL (ref 4.0–10.5)
nRBC: 0 % (ref 0.0–0.2)

## 2020-08-15 LAB — GLUCOSE, CAPILLARY
Glucose-Capillary: 147 mg/dL — ABNORMAL HIGH (ref 70–99)
Glucose-Capillary: 147 mg/dL — ABNORMAL HIGH (ref 70–99)
Glucose-Capillary: 152 mg/dL — ABNORMAL HIGH (ref 70–99)
Glucose-Capillary: 177 mg/dL — ABNORMAL HIGH (ref 70–99)
Glucose-Capillary: 185 mg/dL — ABNORMAL HIGH (ref 70–99)
Glucose-Capillary: 187 mg/dL — ABNORMAL HIGH (ref 70–99)

## 2020-08-15 LAB — BASIC METABOLIC PANEL
Anion gap: 9 (ref 5–15)
BUN: 17 mg/dL (ref 8–23)
CO2: 21 mmol/L — ABNORMAL LOW (ref 22–32)
Calcium: 9 mg/dL (ref 8.9–10.3)
Chloride: 106 mmol/L (ref 98–111)
Creatinine, Ser: 1.48 mg/dL — ABNORMAL HIGH (ref 0.61–1.24)
GFR, Estimated: 50 mL/min — ABNORMAL LOW (ref >60)
Glucose, Bld: 144 mg/dL — ABNORMAL HIGH (ref 70–99)
Potassium: 3.8 mmol/L (ref 3.5–5.1)
Sodium: 136 mmol/L (ref 135–145)

## 2020-08-15 MED ORDER — WARFARIN SODIUM 4 MG PO TABS
4.0000 mg | ORAL_TABLET | Freq: Once | ORAL | Status: DC
  Filled 2020-08-15: qty 1

## 2020-08-15 MED ORDER — WARFARIN - PHARMACIST DOSING INPATIENT: Freq: Every day | Status: DC

## 2020-08-15 MED ORDER — SODIUM BICARBONATE 650 MG PO TABS
650.0000 mg | ORAL_TABLET | Freq: Three times a day (TID) | ORAL | Status: DC
  Administered 2020-08-15 – 2020-08-20 (×15): 650 mg via ORAL
  Filled 2020-08-15 (×14): qty 1

## 2020-08-15 NOTE — TOC Initial Note (Addendum)
Transition of Care [facility]) - Initial/Assessment Note    Patient Details  Name: Hector Mueller MRN: [id number] Date of Birth: Oct 03, 1950  [facility] CM/SW Contact:    Magnus Ivan, LCSW Phone Number: 08/15/2020, 9:33 AM  Clinical Narrative:                Completed high risk screening with patient. Patient lives with his wife. Drives himself to appointments. PCP is Dr. Caryl Comes. Pharmacy is [facility], states they deliver his meds to his home. Patient has a RW and cane. Active with [facility] for RN and PT services. No SNF history. Patient is refusing SNF recommendation, prefers to return home with Home Health. Advanced Representative Corene Cornea is aware of hospitalization.  Expected Discharge Plan: [place] Barriers to Discharge: Continued Medical Work up   Patient Goals and CMS Choice Patient states their goals for this hospitalization and ongoing recovery are:: home with continued home health, refusing SNF CMS Medicare.gov Compare Post Acute Care list provided to:: Patient Choice offered to / list presented to : Patient  Expected Discharge Plan and Services Expected Discharge Plan: [place]       Living arrangements for the past 2 months: [place] Agency: [place] [facility]) Date [facility] Contacted: 08/15/20   Representative spoke with at [place]: Corene Cornea  Prior Living Arrangements/Services Living arrangements for the past 2 months: [place] Lives with:: Spouse Patient language and need for interpreter reviewed:: Yes Do you feel safe going back to the place where you live?: Yes      Need for Family Participation in Patient Care: Yes (Comment) Care giver support system in place?: Yes (comment) Current home services: DME Criminal Activity/Legal Involvement Pertinent to Current Situation/Hospitalization: No - Comment as needed  Activities of Daily  Living      Permission Sought/Granted Permission sought to share information with : Facility Retail banker granted to share information with : Yes, Verbal Permission Granted     Permission granted to share info w AGENCY: [place] granted to share info w Relationship: spouse     Emotional Assessment       Orientation: : Oriented to Self,Oriented to Place,Oriented to  Time,Oriented to Situation Alcohol / Substance Use: Not Applicable Psych Involvement: No (comment)  Admission diagnosis:  Delirium [R41.0] Type 2 diabetes mellitus with hyperosmolar hyperglycemic state (HHS) ([place]) [E11.00, E11.65] Patient Active Problem List   Diagnosis Date Noted  . Type 2 diabetes mellitus with hyperosmolar hyperglycemic state (HHS) ([place]) 08/11/2020  . Hx of liver transplant ([place])   . CKD (chronic kidney disease), stage III ([place])   . HFrEF (heart failure with reduced ejection fraction) ([place])   . Atrial fibrillation ([place])   . Acute respiratory failure ([place]) 07/11/2020  . Hip fracture ([place]) 11/27/2017   PCP:  [name], MD Pharmacy:   [facility] [place] [facility] [address] [place] [address] Phone: [phone] Fax: [phone]     Social Determinants of Health (SDOH) Interventions    Readmission Risk Interventions Readmission Risk Prevention Plan 08/15/2020  Transportation Screening Complete  PCP or Specialist Appt within 3-5 Days Complete  HRI or [place] Complete  Social Work  Consult for Recovery Care Planning/Counseling Complete  Palliative Care Screening Not Applicable  Medication Review (RN Care Manager) Complete  Some recent data might be hidden

## 2020-08-15 NOTE — Progress Notes (Signed)
PROGRESS NOTE    Hector Mueller  NIO:[id number] DOB: Dec 23, 1950 DOA: 08/10/2020 PCP: [name], MD   Brief Narrative: Taken from H&P and prior notes. Hector Mueller is a 70 y.o. 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 Found to have CBG of 859, admitted for hyperosmolar hyperglycemia, initially managed with insulin infusion. Due to worsening agitation he was started on Precedex infusion, later transitioned off with improvement in mental status and agitation. Significantly improved-PT is recommending SNF placement. Esophageal gram was obtained due to his complaint of food sticking in chest and causing pain, it shows narrowing at distal esophagus, recommending endoscopy.  GI was consulted-EGD canceled today due to INR of 1.9.  Holding Coumadin for now. GI is recommending doing EGD during weekdays for better anesthesia support.  Patient has prior complicated history of intubations and a vocal cord paralysis.  Subjective: Patient continued to experience dysphagia.  Niece at bedside.  He wants to go home with home health services and declining SNF.  Agreeable to stay for next 2 days so EGD can be done earlier in the week.  Assessment & Plan:   Principal Problem:   Type 2 diabetes mellitus with hyperosmolar hyperglycemic state (HHS) ([place]) Active Problems:   Atrial fibrillation (HCC)   HFrEF (heart failure with reduced ejection fraction) (HCC)   Hx of liver transplant ([place])   CKD (chronic kidney disease), stage III (HCC)  Acute metabolic encephalopathy.  Resolved  Most likely related to HHS, became quite agitated and  somnolent.  PCO2 normal on ABG.  TSH mildly elevated at 4.5, ammonia levels within normal limit, UA with some proteinuria but no pyuria or bacteriuria, UDS negative. Precedex infusion has been discontinued appears at baseline now. -Continue to monitor  Dysphagia.  Patient's symptoms were concerning for dysphagia, esophageal gram was obtained which shows distal esophageal narrowing and inability to pass pill and they were recommending endoscopic evaluation.  GI was consulted EGD got canceled today as INR is 1.9, GI wants to keep holding Coumadin and plan for EGD earlier during the week for better anesthesia support due to his complicated past medical history.  Paroxysmal atrial fibrillation.  Currently in sinus rhythm. -Continue home metoprolol -Holding Coumadin for EGD-INR was subtherapeutic not sure whether patient was taking it, per patient he was missing doses as keep forgetting to take his medications.  Uncontrolled type 2 diabetes mellitus with HHS.  CBG currently within goal. -Continue with SSI -Continue Lantus 12 units daily  CKD stage IIIb.  Creatinine seems improved from his baseline with IV fluid, currently stable. IV fluids were discontinued. -Monitor renal function -Avoid nephrotoxins  History of liver cirrhosis secondary to hep C s/p liver transplant in 2018. Ammonia levels within normal limit and LFTs stable. -Continue home meds.  Anemia of chronic disease/thrombocytopenia.  Seems stable. -Continue to monitor  Chronic HFrEF with right heart failure/pulmonary hypertension.  Appears well compensated. -Continue home meds -Daily weight and BMP  Objective: Vitals:   08/14/20 2041 08/15/20 0011 08/15/20 0427 08/15/20 0728  BP: 126/74 126/75 123/68 (!) 130/98  Pulse: 70 71 68 (!) 57  Resp: 18 16 18 18   Temp: 98.3 F (36.8 C) 98.7 F (37.1 C) 97.8 F (36.6 C)  97.6 F (36.4 C)  TempSrc: Oral   Oral  SpO2:  98% 95% 96%  Weight:      Height:        Intake/Output  Summary (Last 24 hours) at 08/15/2020 2694 Last data filed at 08/15/2020 0210 Gross per 24 hour  Intake 245 ml  Output 1000 ml  Net -755 ml   Filed Weights   08/11/20 0227 08/12/20 0411 08/13/20 0402  Weight: 68.2 kg 72.1 kg 66.7 kg    Examination:  General.  Frail elderly man, in no acute distress. Pulmonary.  Lungs clear bilaterally, normal respiratory effort. CV.  Regular rate and rhythm, no JVD, rub or murmur. Abdomen.  Soft, nontender, nondistended, BS positive. CNS.  Alert and oriented x3.  No focal neurologic deficit. Extremities.  No edema, no cyanosis, pulses intact and symmetrical. Psychiatry.  Judgment and insight appears normal.  DVT prophylaxis: SCDs, Coumadin is  on hold Code Status: Full Family Communication: Niece was updated at bedside. Disposition Plan:  Status is: Inpatient  Remains inpatient appropriate because:Inpatient level of care appropriate due to severity of illness   Dispo: The patient is from: Home              Anticipated d/c is to: Home              Patient currently is not medically stable to d/c.   Difficult to place patient No              Level of care: Med-Surg  All the records are reviewed and case discussed with Care Management/Social Worker. Management plans discussed with the patient, nursing and they are in agreement.  Consultants:   PCCM  GI  Procedures:  Antimicrobials:   Data Reviewed: I have personally reviewed following labs and imaging studies  CBC: Recent Labs  Lab 08/10/20 2251 08/11/20 0304 08/12/20 0336 08/15/20 0427  WBC 5.0 7.8 4.2 5.3  NEUTROABS 3.6  --   --   --   HGB 11.9* 12.7* 9.8* 11.1*  HCT 35.7* 36.5* 28.9* 32.6*  MCV 91.8 89.0 92.3 90.8  PLT 127* 138* 97* 854*   Basic Metabolic Panel: Recent Labs  Lab 08/11/20 0756 08/11/20 1141 08/12/20 0336 08/13/20 0322 08/14/20 0531 08/15/20 0427  NA 134*  --  137 137 138 136  K 3.7  --  3.6 4.1 3.6 3.8  CL 94*  --  106 108 108 106  CO2 26  --   25 23 22  21*  GLUCOSE 165*  --  110* 187* 110* 144*  BUN 30*  --  20 15 13 17   CREATININE 1.74*  --  1.43* 1.31* 1.43* 1.48*  CALCIUM 9.7  --  8.4* 8.7* 8.7* 9.0  MG  --  2.2  --   --   --   --    GFR: Estimated Creatinine Clearance: 42.6 mL/min (A) (by C-G formula based on SCr of 1.48 mg/dL (H)). Liver Function Tests: Recent Labs  Lab 08/10/20 2251  AST 12*  ALT 13  ALKPHOS 88  BILITOT 1.0  PROT 6.9  ALBUMIN 3.8   No results for input(s): LIPASE, AMYLASE in the last 168 hours. Recent Labs  Lab 08/11/20 1141  AMMONIA 17   Coagulation Profile: Recent Labs  Lab 08/11/20 0304 08/13/20 0322 08/14/20 0531 08/15/20 0427  INR 1.1 1.2 1.6* 1.9*   Cardiac Enzymes: No results for input(s): CKTOTAL, CKMB, CKMBINDEX, TROPONINI in the last 168 hours. BNP (last 3 results) No results for input(s):  PROBNP in the last 8760 hours. HbA1C: No results for input(s): HGBA1C in the last 72 hours. CBG: Recent Labs  Lab 08/14/20 1535 08/14/20 2037 08/15/20 0012 08/15/20 0430 08/15/20 0724  GLUCAP 249* 217* 177* 152* 147*   Lipid Profile: No results for input(s): CHOL, HDL, LDLCALC, TRIG, CHOLHDL, LDLDIRECT in the last 72 hours. Thyroid Function Tests: No results for input(s): TSH, T4TOTAL, FREET4, T3FREE, THYROIDAB in the last 72 hours. Anemia Panel: No results for input(s): VITAMINB12, FOLATE, FERRITIN, TIBC, IRON, RETICCTPCT in the last 72 hours. Sepsis Labs: No results for input(s): PROCALCITON, LATICACIDVEN in the last 168 hours.  Recent Results (from the past 240 hour(s))  Resp Panel by RT-PCR (Flu A&B, Covid) Nasopharyngeal Swab     Status: None   Collection Time: 08/10/20 12:08 AM   Specimen: Nasopharyngeal Swab; Nasopharyngeal(NP) swabs in vial transport medium  Result Value Ref Range Status   SARS Coronavirus 2 by RT PCR NEGATIVE NEGATIVE Final    Comment: (NOTE) SARS-CoV-2 target nucleic acids are NOT DETECTED.  The SARS-CoV-2 RNA is generally detectable in upper  respiratory specimens during the acute phase of infection. The lowest concentration of SARS-CoV-2 viral copies this assay can detect is 138 copies/mL. A negative result does not preclude SARS-Cov-2 infection and should not be used as the sole basis for treatment or other patient management decisions. A negative result may occur with  improper specimen collection/handling, submission of specimen other than nasopharyngeal swab, presence of viral mutation(s) within the areas targeted by this assay, and inadequate number of viral copies(<138 copies/mL). A negative result must be combined with clinical observations, patient history, and epidemiological information. The expected result is Negative.  Fact Sheet for Patients:  EntrepreneurPulse.com.au  Fact Sheet for Healthcare Providers:  IncredibleEmployment.be  This test is no t yet approved or cleared by the [place] FDA and  has been authorized for detection and/or diagnosis of SARS-CoV-2 by FDA under an Emergency Use Authorization (EUA). This EUA will remain  in effect (meaning this test can be used) for the duration of the COVID-19 declaration under Section 564(b)(1) of the Act, 21 U.S.C.section 360bbb-3(b)(1), unless the authorization is terminated  or revoked sooner.       Influenza A by PCR NEGATIVE NEGATIVE Final   Influenza B by PCR NEGATIVE NEGATIVE Final    Comment: (NOTE) The Xpert Xpress SARS-CoV-2/FLU/RSV plus assay is intended as an aid in the diagnosis of influenza from Nasopharyngeal swab specimens and should not be used as a sole basis for treatment. Nasal washings and aspirates are unacceptable for Xpert Xpress SARS-CoV-2/FLU/RSV testing.  Fact Sheet for Patients: EntrepreneurPulse.com.au  Fact Sheet for Healthcare Providers: IncredibleEmployment.be  This test is not yet approved or cleared by the [place] FDA and has been  authorized for detection and/or diagnosis of SARS-CoV-2 by FDA under an Emergency Use Authorization (EUA). This EUA will remain in effect (meaning this test can be used) for the duration of the COVID-19 declaration under Section 564(b)(1) of the Act, 21 U.S.C. section 360bbb-3(b)(1), unless the authorization is terminated or revoked.  Performed at [facility], [address] [place], [place] [address]   MRSA PCR Screening     Status: None   Collection Time: 08/11/20  2:17 AM   Specimen: Nasopharyngeal  Result Value Ref Range Status   MRSA by PCR NEGATIVE NEGATIVE Final    Comment:        The GeneXpert MRSA Assay (FDA approved for NASAL specimens only), is one component of a  comprehensive MRSA colonization surveillance program. It is not intended to diagnose MRSA infection nor to guide or monitor treatment for MRSA infections. Performed at [facility], [address] [place], [place] [address]      Radiology Studies: DG ESOPHAGUS W DOUBLE CM (HD)  Result Date: 08/14/2020 CLINICAL DATA:  Difficulty swallowing, pills getting stuck EXAM: ESOPHOGRAM/BARIUM SWALLOW TECHNIQUE: Single contrast examination was performed using thin barium soluble. The patient was observed with fluoroscopy swallowing a 13 mm barium sulphate tablet. FLUOROSCOPY TIME:  Fluoroscopy Time:  2 minutes 12 seconds Radiation Exposure Index (if provided by the fluoroscopic device): 19.6 mGy COMPARISON:  None. FINDINGS: Somewhat technically limited by patient positioning. Normal pharyngeal contours. There is transient penetration but no aspiration. Normal esophageal motility. Relative narrowing of the distal esophagus without holdup of contrast. However, barium pill was held up at this location despite multiple swallows of water. Gastroesophageal reflux is present. No hiatal hernia. IMPRESSION: Relative narrowing of the distal esophagus. This did not holdup passage of contrast but did prevent passage  of barium pill despite several swallows of water. Consider endoscopic evaluation. Gastrostomy of reflux. Electronically Signed   By: Macy Mis M.D.   On: 08/14/2020 13:00    Scheduled Meds: . amiodarone  200 mg Oral Daily  . Chlorhexidine Gluconate Cloth  6 each Topical Daily  . cycloSPORINE modified  200 mg Oral Daily  . dextromethorphan-guaiFENesin  1 tablet Oral BID  . DULoxetine  60 mg Oral QHS  . insulin aspart  0-15 Units Subcutaneous Q4H  . insulin glargine  12 Units Subcutaneous Q24H  . lactulose  10 g Oral BID  . melatonin  2.5 mg Oral QHS  . metoprolol succinate  25 mg Oral BID  . mirtazapine  15 mg Oral QHS  . mycophenolate  500 mg Oral BID  . pantoprazole  40 mg Oral Daily  . rosuvastatin  5 mg Oral Daily  . sildenafil  40 mg Oral TID  . sodium bicarbonate  650 mg Oral BID  . sodium chloride flush  10 mL Intravenous Q12H  . Warfarin - Pharmacist Dosing Inpatient   Does not apply q1600   Continuous Infusions: . sodium chloride       LOS: 4 days   Time spent: 30 minutes. More than 50% of the time was spent in counseling/coordination of care  Lorella Nimrod, MD [facility]  If 7PM-7AM, please contact night-coverage [email or web address]  08/15/2020, 8:32 AM   This record has been created using Systems analyst. Errors have been sought and corrected,but may not always be located. Such creation errors do not reflect on the standard of care.

## 2020-08-15 NOTE — Progress Notes (Signed)
Physical Therapy Treatment Patient Details Name: Hector Mueller MRN: [id number] DOB: 1950-05-15 Today's Date: 08/15/2020    History of Present Illness Pt admitted for DM with hypersomolar hyperglycemic state with complaints of AMS. History includes liver cirrhosis s/p liver transplant, DM, BPH, and CHF.    PT Comments    Patient resting in bed upon arrival contemplating what to eat for lunch. Niece in the room and agrees to help with chair follow as needed. Gave patient time to order food but after several minutes and a failed attempt to use the phone, niece helped patient order foot. Patient did seem to want to do this himself at first but did have trouble completing the task. Patient then participated in PT without pain and vitals stayed [facility]. Completed supine to sit with mod I due to need for increased time. Completed sit <> stand, ambulation (270 feet), and 7 steps with CGA-SBA. Significant improvement from prior sessions and now recommended to go home with home health PT due to improvements in functional strength/independence and 24/7 supervision at home. Patient would benefit from continued skilled physical therapy to address remaining impairments and functional limitations to work towards stated goals and return to PLOF or maximal functional independence.       Follow Up Recommendations  Home health PT (pt only agreeable to home-has HH established)     Equipment Recommendations  None recommended by PT    Recommendations for Other Services       Precautions / Restrictions Precautions Precautions: Fall Restrictions Weight Bearing Restrictions: No    Mobility  Bed Mobility Overal bed mobility: Modified Independent Bed Mobility: Supine to Sit     Supine to sit: Modified independent (Device/Increase time)     General bed mobility comments: increased time needed    Transfers Overall transfer level: Needs assistance Equipment used: Rolling walker (2 wheeled) Transfers: Sit  to/from Stand Sit to Stand: Min guard         General transfer comment: patient able to come to stand with CGA and sit down. Did forget to reach back when going to sit.  Ambulation/Gait Ambulation/Gait assistance: Min Gaffer (Feet): 270 Feet Assistive device: Rolling walker (2 wheeled) Gait Pattern/deviations: Step-through pattern     General Gait Details: ambulated around nursing station, then slighly past room to hall to stairs and back using RW and CGA-supervision for safety.   Stairs Stairs: Yes Stairs assistance: Min guard Stair Management: One rail Left;Sideways;Forwards Number of Stairs: 7 General stair comments: descended 7 steps sideways leading with L LE and BUE support on railing. Ascended 7 steps step over step pattern forwards with L UE support. Fatigued but determined.   Wheelchair Mobility    Modified Rankin (Stroke Patients Only)       Balance Overall balance assessment: Needs assistance Sitting-balance support: No upper extremity supported;Feet supported Sitting balance-Leahy Scale: Good Sitting balance - Comments: steady sitting edge of bed   Standing balance support: Bilateral upper extremity supported;During functional activity Standing balance-Leahy Scale: Fair Standing balance comment: requires UE support on RW or handrails                            Cognition                                              Exercises  General Comments        Pertinent Vitals/Pain Pain Assessment: No/denies pain    Home Living                      Prior Function            PT Goals (current goals can now be found in the care plan section) Acute Rehab PT Goals Patient Stated Goal: to get better, go home PT Goal Formulation: With patient Time For Goal Achievement: 08/27/20 Potential to Achieve Goals: Good Progress towards PT goals: Progressing toward goals    Frequency    Min  2X/week      PT Plan Discharge plan needs to be updated    Co-evaluation              AM-PAC PT "6 Clicks" Mobility   Outcome Measure  Help needed turning from your back to your side while in a flat bed without using bedrails?: None Help needed moving from lying on your back to sitting on the side of a flat bed without using bedrails?: A Little Help needed moving to and from a bed to a chair (including a wheelchair)?: A Little Help needed standing up from a chair using your arms (e.g., wheelchair or bedside chair)?: A Little Help needed to walk in hospital room?: A Little Help needed climbing 3-5 steps with a railing? : A Little 6 Click Score: 19    End of Session Equipment Utilized During Treatment: Gait belt Activity Tolerance: Patient tolerated treatment well Patient left: in chair;with call bell/phone within reach Nurse Communication: Mobility status PT Visit Diagnosis: Other abnormalities of gait and mobility (R26.89);Muscle weakness (generalized) (M62.81);Unsteadiness on feet (R26.81);Difficulty in walking, not elsewhere classified (R26.2);Pain     Time: 1205-1230 PT Time Calculation (min) (ACUTE ONLY): 25 min  Charges:  $Gait Training: 8-22 mins $Therapeutic Activity: 8-22 mins                     Everlean Alstrom. Graylon Good, PT, DPT 08/15/20, 2:14 PM

## 2020-08-15 NOTE — Consult Note (Signed)
[facility] for Warfarin Dosing Indication: atrial fibrillation  Patient Measurements: Height: 5\' 9"  (175.3 cm) Weight: 66.7 kg (147 lb 0.8 oz) IBW/kg (Calculated) : 70.7  Labs: Recent Labs    08/13/20 0322 08/14/20 0531 08/15/20 0427  HGB  --   --  11.1*  HCT  --   --  32.6*  PLT  --   --  107*  LABPROT 15.5* 19.1* 22.1*  INR 1.2 1.6* 1.9*  CREATININE 1.31* 1.43* 1.48*    Estimated Creatinine Clearance: 42.6 mL/min (A) (by C-G formula based on SCr of 1.48 mg/dL (H)).   Medical History: Past Medical History:  Diagnosis Date  . Atrial fibrillation ([place])   . BPH (benign prostatic hyperplasia)   . Cancer ([place])    liver  . CHF (congestive heart failure) ([place])   . Chronic kidney disease   . Cirrhosis ([place])   . Diabetes mellitus without complication ([place])   . GERD (gastroesophageal reflux disease)   . Hepatitis C   . Hx of liver transplant ([place])   . Pulmonary HTN ([place])     Assessment: Pharmacy has been consulted for Warfarin dosing and monitoring in 70yo patient with history of Atrial Fibrillation.   Patient has been taking warfarin PTA with their home dose being 2.5mg  daily. 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  DDI: amiodarone and cyclosporine  Date INR Plan  5/31 1.1 Held  6/1 N/A 3.75 mg  6/2 1.2 4 mg  6/3 1.6 5 mg  6/4 1.9     Goal of Therapy:  INR 2-3   Plan:  --INR is slightly subtherapeutic.  - Will give warfarin 4 mg tonight  --INR daily --CBC at least every 3 days per protocol   Lu Duffel, PharmD, BCPS Clinical Pharmacist 08/15/2020 11:24 AM

## 2020-08-15 NOTE — Consult Note (Signed)
Vonda Antigua, MD [address] [place], [place], [place], [address] [address], [place], [place], [place], [address] Phone: [phone]  Fax: [phone]  Consultation  Referring Provider:     Dr. Reesa Chew Primary Care Physician:  [name], MD Reason for Consultation:     Dysphagia  Date of Admission:  08/10/2020 Date of Consultation:  08/15/2020         HPI:   Dmani Mizer is a 70 y.o. male who reports 3-4 month history of dysphagia to solids and liquids, but more to solids. No episodes of food impaction.  Esophagram done yesterday due to his dysphagia showed relative narrowing of the distal esophagus.   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  He has worked with speech pathology since then and continues to have some dysphonia.  Patient has had upper endoscopies before at [facility] in 2015 and 2016 due to esophageal varices and banding was done at that time.  It does not appear that patient was having dysphagia previously prior to 3 to 4 months ago.  On this admission, patient was admitted for altered mental status, agitation, requiring Precedex infusion, and this has resolved and was attributed to HHS.  He was recently discharged less than a month ago, on 07/20/2020 when he was also admitted for altered mental status which was thought to be multifactorial, metabolic, suspected CVA which as per discharge summary was subsequently ruled out.  On that  admission, patient underwent cardioversion and has been on Coumadin.  As per cardiology, Dr. Tyrell Antonio last note on that admission, outpatient stress test was planned due to elevated troponin on that admission and has not been done yet.  Patient also has history of pulmonary hypertension  Past Medical History:  Diagnosis Date  . Atrial fibrillation ([place])   . BPH (benign prostatic hyperplasia)   . Cancer ([place])    liver  . CHF (congestive heart failure) ([place])   . Chronic kidney disease   . Cirrhosis ([place])   . Diabetes mellitus without complication ([place])   . GERD (gastroesophageal reflux disease)   . Hepatitis C   . Hx of liver transplant ([place])   . Pulmonary HTN ([place])     Past Surgical History:  Procedure Laterality Date  . ANKLE FRACTURE SURGERY Bilateral   . BACK SURGERY    . CLAVICLE SURGERY    . INTRAMEDULLARY (IM) NAIL INTERTROCHANTERIC Right 11/28/2017   Procedure: INTRAMEDULLARY (IM) NAIL INTERTROCHANTRIC;  Surgeon: Earnestine Leys, MD;  Location: [facility] ORS;  Service: Orthopedics;  Laterality: Right;  . LIVER SURGERY      Prior to Admission medications   Medication Sig Start Date End Date Taking? Authorizing Provider  bisacodyl (DULCOLAX) 5 MG EC tablet Take 5 mg by mouth daily as needed for moderate constipation.   Yes [provider]  chlorthalidone (HYGROTON) 25 MG tablet Take 12.5 mg by mouth in the morning. 07/10/20 07/10/21 Yes [provider]  Cholecalciferol (VITAMIN D3) 125 MCG (5000 UT) CAPS Take 3,000  Units by mouth daily.   Yes [provider]  cycloSPORINE modified (NEORAL) 100 MG capsule Take 200 mg by mouth daily.   Yes [provider]  cycloSPORINE modified (NEORAL) 25 MG capsule Take 175 mg by mouth every evening.   Yes [provider]  DULoxetine (CYMBALTA) 30 MG capsule Take 60 mg by mouth at bedtime. 05/22/20  Yes [provider]  Melatonin 3 MG TABS Take 3 mg by mouth at bedtime. 02/06/17  Yes [provider]  mirtazapine (REMERON) 15 MG tablet Take 15 mg by mouth at bedtime.  10/18/17  Yes [provider]  mycophenolate (CELLCEPT) 250 MG capsule Take 500 mg by mouth in the morning and at bedtime. 04/14/20 04/14/21 Yes [provider]  OZEMPIC, 1 MG/DOSE, 4 MG/3ML SOPN Inject 1 mg into the skin once a week. 05/19/20  Yes [provider]  pregabalin (LYRICA) 75 MG capsule Take 75 mg by mouth in the morning and at bedtime. 05/15/19  Yes [provider]  rosuvastatin (CRESTOR) 5 MG tablet Take 5 mg by mouth daily. 06/09/20  Yes [provider]  sildenafil (REVATIO) 20 MG tablet Take 40 mg by mouth 3 (three) times daily.  10/17/17  Yes [provider]  sodium bicarbonate 650 MG tablet Take 650 mg by mouth in the morning and at bedtime. 03/18/20 03/18/21 Yes [provider]  Specialty Vitamins Products (MAGNESIUM, AMINO ACID CHELATE,) 133 MG tablet Take 2 tablets by mouth 2 (two) times daily. 03/02/17  Yes [provider]  alendronate (FOSAMAX) 70 MG tablet Take 70 mg by mouth once a week. Take with a full glass of water on an empty stomach.    [provider]  amiodarone (PACERONE) 200 MG tablet Take 1 tablet (200 mg total) by mouth 2 (two) times daily for 7 days, THEN 1 tablet (200 mg total) daily. 07/21/20 08/27/20  Sidney Ace, MD  Insulin Glargine (BASAGLAR KWIKPEN) 100 UNIT/ML Inject 12 Units into the skin daily as needed. Patient not taking: No sig reported    [provider]  metoprolol succinate (TOPROL-XL) 25 MG 24 hr tablet Take 1 tablet (25 mg total) by mouth 2 (two) times daily. 07/20/20 08/19/20  Sidney Ace, MD  omeprazole (PRILOSEC) 20 MG capsule Take 20 mg by mouth daily.    [provider]  ondansetron (ZOFRAN) 4 MG tablet Take 1 tablet (4 mg total) by mouth every 6 (six) hours as needed for nausea. 12/01/17   Vaughan Basta, MD  OZEMPIC, 0.25 OR 0.5 MG/DOSE, 2 MG/1.5ML SOPN  Inject 0.5 mg into the skin once a week. Patient not taking: Reported on 08/11/2020 05/04/20   [provider]  Tacrine Hydrochloride (COGNEX PO) Take 2 capsules by mouth daily at 6 (six) AM. With a glass of water    [provider]  warfarin (COUMADIN) 2.5 MG tablet Take 1 tablet (2.5 mg total) by mouth daily. 07/20/20 07/20/21  Sidney Ace, MD    History reviewed. No pertinent family history.   Social History   Tobacco Use  . Smoking status: Never Smoker  . Smokeless tobacco: Never Used  Vaping Use  . Vaping Use: Never used  Substance Use Topics  . Alcohol use: No  . Drug use: Never    Allergies as of 08/10/2020  . (No Known Allergies)    Review of Systems:    All systems reviewed and negative except where noted in HPI.   Physical Exam:  Vital  signs in last 24 hours: Vitals:   08/14/20 2041 08/15/20 0011 08/15/20 0427 08/15/20 0728  BP: 126/74 126/75 123/68 (!) 130/98  Pulse: 70 71 68 (!) 57  Resp: 18 16 18 18   Temp: 98.3 F (36.8 C) 98.7 F (37.1 C) 97.8 F (36.6 C) 97.6 F (36.4 C)  TempSrc: Oral   Oral  SpO2:  98% 95% 96%  Weight:      Height:       Last BM Date: 08/14/20 General:   Pleasant, cooperative in NAD Head:  Normocephalic and atraumatic. Eyes:   No icterus.   Conjunctiva pink. PERRLA. Ears:  Normal auditory acuity. Neck:  Supple; no masses or thyroidomegaly Lungs: Respirations even and unlabored.  Abdomen:  Soft, nondistended, nontender. Normal bowel sounds. No appreciable masses or hepatomegaly.  No rebound or guarding.  Neurologic:  Alert and oriented x3;  grossly normal neurologically. Skin:  Intact without significant lesions or rashes. Cervical Nodes:  No significant cervical adenopathy. Psych:  Alert and cooperative. Normal affect.  LAB RESULTS: Recent Labs    08/15/20 0427  WBC 5.3  HGB 11.1*  HCT 32.6*  PLT 107*   BMET Recent Labs    08/13/20 0322 08/14/20 0531 08/15/20 0427  NA 137 138 136  K 4.1 3.6  3.8  CL 108 108 106  CO2 23 22 21*  GLUCOSE 187* 110* 144*  BUN 15 13 17   CREATININE 1.31* 1.43* 1.48*  CALCIUM 8.7* 8.7* 9.0   LFT No results for input(s): PROT, ALBUMIN, AST, ALT, ALKPHOS, BILITOT, BILIDIR, IBILI in the last 72 hours. PT/INR Recent Labs    08/14/20 0531 08/15/20 0427  LABPROT 19.1* 22.1*  INR 1.6* 1.9*    STUDIES: DG ESOPHAGUS W DOUBLE CM (HD)  Result Date: 08/14/2020 CLINICAL DATA:  Difficulty swallowing, pills getting stuck EXAM: ESOPHOGRAM/BARIUM SWALLOW TECHNIQUE: Single contrast examination was performed using thin barium soluble. The patient was observed with fluoroscopy swallowing a 13 mm barium sulphate tablet. FLUOROSCOPY TIME:  Fluoroscopy Time:  2 minutes 12 seconds Radiation Exposure Index (if provided by the fluoroscopic device): 19.6 mGy COMPARISON:  None. FINDINGS: Somewhat technically limited by patient positioning. Normal pharyngeal contours. There is transient penetration but no aspiration. Normal esophageal motility. Relative narrowing of the distal esophagus without holdup of contrast. However, barium pill was held up at this location despite multiple swallows of water. Gastroesophageal reflux is present. No hiatal hernia. IMPRESSION: Relative narrowing of the distal esophagus. This did not holdup passage of contrast but did prevent passage of barium pill despite several swallows of water. Consider endoscopic evaluation. Gastrostomy of reflux. Electronically Signed   By: Macy Mis M.D.   On: 08/14/2020 13:00      Impression / Plan:   Chukwudi Ewen is a 70 y.o. y/o male with admission for altered mental status which was attributed to hyperosmolar hyperglycemic state on admission, with GI consulted for 3 to 32-month history of dysphagia with esophagram yesterday showing relative narrowing of the distal esophagus, with complicated previous history including vocal cord paralysis from multiple intubations during liver transplant admission in 2018,  requiring thyroplasty in the past, with continued dysphonia, recent A. fib requiring cardioversion, currently on Coumadin with INR 1.9 today, and cardiology recommending outpatient stress test on last admission for elevated troponin that has not been done yet  Given patient's complicated history, I discussed the case with on-call anesthesiology today and given that his dysphonia is chronic, they do think it sedation for upper endoscopy can be done in  this hospital.  However, they do prefer to do the upper endoscopy during the week given that there is more staff support present than over the weekend if it is needed  Given that patient is dysphagia has been ongoing for 3 to 4 months, and he has no signs of active GI bleeding, or any other alarm symptoms to indicate an urgent endoscopy, this will be an elective procedure for dysphagia.  In addition, he has had previous upper endoscopies in 2015 in 2016 and these did not show any underlying esophageal mass and therefore risk of malignancy causing his symptoms is low  In addition, would recommend cardiology evaluation and clearance prior to any endoscopic procedures given that they were planning on a stress test after his most recent admission and this has not been done yet   Once cardiology evaluates and clears the patient, and INR is less than 1.5, can proceed with endoscopy for further evaluation of dysphagia  I discussed the above with primary attending, Dr. Reesa Chew as well, and as per her the patient is awaiting rehab placement and will be in the hospital at least through the weekend, and she will initiate cardiology work-up and clearance and we can proceed with upper endoscopy accordingly after cardiology evaluation  Given that this will be an elective procedure, if patient is unable to be cleared by cardiology in an inpatient setting, close outpatient follow-up with primary GI at [facility] can also be done for outpatient dysphagia work-up as well  We will  continue to follow  Thank you for involving me in the care of this patient.      LOS: 4 days   Virgel Manifold, MD  08/15/2020, 10:10 AM

## 2020-08-16 DIAGNOSIS — Z944 Liver transplant status: Secondary | ICD-10-CM

## 2020-08-16 LAB — PROTIME-INR
INR: 2.4 — ABNORMAL HIGH (ref 0.8–1.2)
Prothrombin Time: 26.5 seconds — ABNORMAL HIGH (ref 11.4–15.2)

## 2020-08-16 LAB — GLUCOSE, CAPILLARY
Glucose-Capillary: 139 mg/dL — ABNORMAL HIGH (ref 70–99)
Glucose-Capillary: 240 mg/dL — ABNORMAL HIGH (ref 70–99)
Glucose-Capillary: 240 mg/dL — ABNORMAL HIGH (ref 70–99)
Glucose-Capillary: 238 mg/dL — ABNORMAL HIGH (ref 70–99)

## 2020-08-16 LAB — BASIC METABOLIC PANEL
Anion gap: 8 (ref 5–15)
BUN: 23 mg/dL (ref 8–23)
CO2: 23 mmol/L (ref 22–32)
Calcium: 8.9 mg/dL (ref 8.9–10.3)
Chloride: 106 mmol/L (ref 98–111)
Creatinine, Ser: 1.79 mg/dL — ABNORMAL HIGH (ref 0.61–1.24)
GFR, Estimated: 40 mL/min — ABNORMAL LOW (ref >60)
Glucose, Bld: 148 mg/dL — ABNORMAL HIGH (ref 70–99)
Potassium: 4.4 mmol/L (ref 3.5–5.1)
Sodium: 137 mmol/L (ref 135–145)

## 2020-08-16 MED ORDER — WARFARIN SODIUM 2.5 MG PO TABS
2.5000 mg | ORAL_TABLET | Freq: Once | ORAL | Status: DC
  Filled 2020-08-16: qty 1

## 2020-08-16 MED ORDER — WARFARIN - PHARMACIST DOSING INPATIENT: Freq: Every day | Status: DC

## 2020-08-16 NOTE — Progress Notes (Signed)
PROGRESS NOTE    Hector Mueller  KGY:[id number] DOB: 1950-08-12 DOA: 08/10/2020 PCP: [name], MD   Brief Narrative: Taken from H&P and prior notes. Hector Mueller is a 70 y.o. 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 Found to have CBG of 859, admitted for hyperosmolar hyperglycemia, initially managed with insulin infusion. Due to worsening agitation he was started on Precedex infusion, later transitioned off with improvement in mental status and agitation. Significantly improved-PT is recommending SNF placement. Esophageal gram was obtained due to his complaint of food sticking in chest and causing pain, it shows narrowing at distal esophagus, recommending endoscopy.  GI was consulted-   Holding Coumadin for now for possible EGD earlier next week. GI is recommending doing EGD during weekdays for better anesthesia support.  Patient has prior complicated history of intubations and a vocal cord paralysis.  Subjective: Patient was seen and examined today.  No new complaints.  He was resting comfortably.  Assessment & Plan:   Principal Problem:   Type 2 diabetes mellitus with hyperosmolar hyperglycemic state (HHS) ([name]) Active Problems:   Atrial fibrillation (HCC)   HFrEF (heart failure with reduced ejection fraction) (HCC)   Hx of liver transplant ([name])   CKD (chronic kidney disease), stage III (HCC)  Acute metabolic encephalopathy.  Resolved  Most likely related to HHS, became quite agitated and somnolent.  PCO2 normal on ABG.  TSH mildly elevated at 4.5, ammonia levels within normal limit, UA with some proteinuria  but no pyuria or bacteriuria, UDS negative. Precedex infusion has been discontinued appears at baseline now. -Continue to monitor  Dysphagia.  Patient's symptoms were concerning for dysphagia, esophageal gram was obtained which shows distal esophageal narrowing and inability to pass pill and they were recommending endoscopic evaluation.  GI was consulted  GI wants to keep holding Coumadin and plan for EGD earlier during the week for better anesthesia support due to his complicated past medical history. INR of 2.4 today although he did not received any Coumadin yesterday, last dose on 08/14/2020.  Paroxysmal atrial fibrillation.  Currently in sinus rhythm. -Continue home metoprolol -Holding Coumadin for EGD-INR was subtherapeutic not sure whether patient was taking it, per patient he was missing doses as keep forgetting to take his medications.  Uncontrolled type 2 diabetes mellitus with HHS.  CBG currently within goal. -Continue with SSI -Continue Lantus 12 units daily  CKD stage IIIb.  Creatinine seems improved from his baseline with IV fluid, currently stable. IV fluids were discontinued. -Monitor renal function -Avoid nephrotoxins  History of liver cirrhosis secondary to hep C s/p liver transplant in 2018. Ammonia levels within normal limit and LFTs stable. -Continue home meds.  Anemia of chronic disease/thrombocytopenia.  Seems stable. -Continue to monitor  Chronic HFrEF with right heart failure/pulmonary hypertension.  Appears well compensated. -Continue home meds -Daily weight and BMP  Objective: Vitals:   08/16/20 0350 08/16/20 0500 08/16/20 0749 08/16/20 1205  BP: (!) 114/49  140/68 131/62  Pulse: (!) 55  (!) 57 (!) 58  Resp: 18  18 18   Temp: 98.2 F (36.8 C)  97.7 F (36.5 C) 97.9 F (36.6 C)  TempSrc: Oral  Oral   SpO2: 98%  95% 99%  Weight:  72.4 kg    Height:        Intake/Output Summary (Last 24 hours) at 08/16/2020 1306 Last data filed at 08/16/2020  0900 Gross per 24 hour  Intake 240 ml  Output 0 ml  Net 240 ml   Filed Weights   08/12/20 0411 08/13/20 0402 08/16/20 0500  Weight: 72.1 kg 66.7 kg 72.4 kg    Examination:  General.  Frail elderly man, in no acute distress. Pulmonary.  Lungs clear bilaterally, normal respiratory effort. CV.  Regular rate and rhythm, no JVD, rub or murmur. Abdomen.  Soft, nontender, nondistended, BS positive. CNS.  Alert and oriented x3.  No focal neurologic deficit. Extremities.  No edema, no cyanosis, pulses intact and symmetrical. Psychiatry.  Judgment and insight appears normal.  DVT prophylaxis: SCDs, Coumadin is  on hold Code Status: Full Family Communication: Discussed with patient Disposition Plan:  Status is: Inpatient  Remains inpatient appropriate because:Inpatient level of care appropriate due to severity of illness   Dispo: The patient is from: Home              Anticipated d/c is to: Home              Patient currently is not medically stable to d/c.   Difficult to place patient No              Level of care: Med-Surg  All the records are reviewed and case discussed with Care Management/Social Worker. Management plans discussed with the patient, nursing and they are in agreement.  Consultants:   PCCM  GI  Procedures:  Antimicrobials:   Data Reviewed: I have personally reviewed following labs and imaging studies  CBC: Recent Labs  Lab 08/10/20 2251 08/11/20 0304 08/12/20 0336 08/15/20 0427  WBC 5.0 7.8 4.2 5.3  NEUTROABS 3.6  --   --   --   HGB 11.9* 12.7* 9.8* 11.1*  HCT 35.7* 36.5* 28.9* 32.6*  MCV 91.8 89.0 92.3 90.8  PLT 127* 138* 97* 400*   Basic Metabolic Panel: Recent Labs  Lab 08/11/20 1141 08/12/20 0336 08/13/20 0322 08/14/20 0531 08/15/20 0427 08/16/20 0440  NA  --  137 137 138 136 137  K  --  3.6 4.1 3.6 3.8 4.4  CL  --  106 108 108 106 106  CO2  --  25 23 22  21* 23  GLUCOSE  --  110* 187* 110* 144* 148*  BUN  --  20 15 13 17 23    CREATININE  --  1.43* 1.31* 1.43* 1.48* 1.79*  CALCIUM  --  8.4* 8.7* 8.7* 9.0 8.9  MG 2.2  --   --   --   --   --    GFR: Estimated Creatinine Clearance: 37.3 mL/min (A) (by C-G formula based on SCr of 1.79 mg/dL (H)). Liver Function Tests: Recent Labs  Lab 08/10/20 2251  AST 12*  ALT 13  ALKPHOS 88  BILITOT 1.0  PROT 6.9  ALBUMIN 3.8   No results for input(s): LIPASE, AMYLASE in the last 168 hours. Recent Labs  Lab 08/11/20 1141  AMMONIA 17   Coagulation Profile: Recent Labs  Lab 08/11/20 0304 08/13/20 0322 08/14/20 0531 08/15/20 0427 08/16/20 0440  INR 1.1 1.2 1.6* 1.9* 2.4*   Cardiac Enzymes: No results for input(s): CKTOTAL, CKMB, CKMBINDEX, TROPONINI in the last 168 hours. BNP (last 3 results) No results for input(s): PROBNP in the last 8760 hours. HbA1C: No results for input(s): HGBA1C in the last  72 hours. CBG: Recent Labs  Lab 08/15/20 1149 08/15/20 1557 08/15/20 2132 08/16/20 0749 08/16/20 1207  GLUCAP 187* 185* 147* 139* 240*   Lipid Profile: No results for input(s): CHOL, HDL, LDLCALC, TRIG, CHOLHDL, LDLDIRECT in the last 72 hours. Thyroid Function Tests: No results for input(s): TSH, T4TOTAL, FREET4, T3FREE, THYROIDAB in the last 72 hours. Anemia Panel: No results for input(s): VITAMINB12, FOLATE, FERRITIN, TIBC, IRON, RETICCTPCT in the last 72 hours. Sepsis Labs: No results for input(s): PROCALCITON, LATICACIDVEN in the last 168 hours.  Recent Results (from the past 240 hour(s))  Resp Panel by RT-PCR (Flu A&B, Covid) Nasopharyngeal Swab     Status: None   Collection Time: 08/10/20 12:08 AM   Specimen: Nasopharyngeal Swab; Nasopharyngeal(NP) swabs in vial transport medium  Result Value Ref Range Status   SARS Coronavirus 2 by RT PCR NEGATIVE NEGATIVE Final    Comment: (NOTE) SARS-CoV-2 target nucleic acids are NOT DETECTED.  The SARS-CoV-2 RNA is generally detectable in upper respiratory specimens during the acute phase of infection.  The lowest concentration of SARS-CoV-2 viral copies this assay can detect is 138 copies/mL. A negative result does not preclude SARS-Cov-2 infection and should not be used as the sole basis for treatment or other patient management decisions. A negative result may occur with  improper specimen collection/handling, submission of specimen other than nasopharyngeal swab, presence of viral mutation(s) within the areas targeted by this assay, and inadequate number of viral copies(<138 copies/mL). A negative result must be combined with clinical observations, patient history, and epidemiological information. The expected result is Negative.  Fact Sheet for Patients:  EntrepreneurPulse.com.au  Fact Sheet for Healthcare Providers:  IncredibleEmployment.be  This test is no t yet approved or cleared by the [place] FDA and  has been authorized for detection and/or diagnosis of SARS-CoV-2 by FDA under an Emergency Use Authorization (EUA). This EUA will remain  in effect (meaning this test can be used) for the duration of the COVID-19 declaration under Section 564(b)(1) of the Act, 21 U.S.C.section 360bbb-3(b)(1), unless the authorization is terminated  or revoked sooner.       Influenza A by PCR NEGATIVE NEGATIVE Final   Influenza B by PCR NEGATIVE NEGATIVE Final    Comment: (NOTE) The Xpert Xpress SARS-CoV-2/FLU/RSV plus assay is intended as an aid in the diagnosis of influenza from Nasopharyngeal swab specimens and should not be used as a sole basis for treatment. Nasal washings and aspirates are unacceptable for Xpert Xpress SARS-CoV-2/FLU/RSV testing.  Fact Sheet for Patients: EntrepreneurPulse.com.au  Fact Sheet for Healthcare Providers: IncredibleEmployment.be  This test is not yet approved or cleared by the [place] FDA and has been authorized for detection and/or diagnosis of SARS-CoV-2 by FDA  under an Emergency Use Authorization (EUA). This EUA will remain in effect (meaning this test can be used) for the duration of the COVID-19 declaration under Section 564(b)(1) of the Act, 21 U.S.C. section 360bbb-3(b)(1), unless the authorization is terminated or revoked.  Performed at [facility], [address] [place], [place] [address]   MRSA PCR Screening     Status: None   Collection Time: 08/11/20  2:17 AM   Specimen: Nasopharyngeal  Result Value Ref Range Status   MRSA by PCR NEGATIVE NEGATIVE Final    Comment:        The GeneXpert MRSA Assay (FDA approved for NASAL specimens only), is one component of a comprehensive MRSA colonization surveillance program. It is not intended to diagnose MRSA infection nor to  guide or monitor treatment for MRSA infections. Performed at [facility], [address] [place], [place] [address]      Radiology Studies: No results found.  Scheduled Meds: . amiodarone  200 mg Oral Daily  . Chlorhexidine Gluconate Cloth  6 each Topical Daily  . cycloSPORINE modified  200 mg Oral Daily  . dextromethorphan-guaiFENesin  1 tablet Oral BID  . DULoxetine  60 mg Oral QHS  . insulin glargine  12 Units Subcutaneous Q24H  . lactulose  10 g Oral BID  . melatonin  2.5 mg Oral QHS  . metoprolol succinate  25 mg Oral BID  . mirtazapine  15 mg Oral QHS  . mycophenolate  500 mg Oral BID  . pantoprazole  40 mg Oral Daily  . rosuvastatin  5 mg Oral Daily  . sildenafil  40 mg Oral TID  . sodium bicarbonate  650 mg Oral TID  . sodium chloride flush  10 mL Intravenous Q12H  . Warfarin - Pharmacist Dosing Inpatient   Does not apply q1600   Continuous Infusions: . sodium chloride       LOS: 5 days   Time spent: 30 minutes. More than 50% of the time was spent in counseling/coordination of care  Lorella Nimrod, MD [facility]  If 7PM-7AM, please contact night-coverage [email or web address]  08/16/2020, 1:06 PM   This record  has been created using Systems analyst. Errors have been sought and corrected,but may not always be located. Such creation errors do not reflect on the standard of care.

## 2020-08-16 NOTE — Consult Note (Addendum)
[facility] for Warfarin Dosing Indication: atrial fibrillation  Patient Measurements: Height: 5\' 9"  (175.3 cm) Weight: 72.4 kg (159 lb 9.8 oz) IBW/kg (Calculated) : 70.7  Labs: Recent Labs    08/14/20 0531 08/15/20 0427 08/16/20 0440  HGB  --  11.1*  --   HCT  --  32.6*  --   PLT  --  107*  --   LABPROT 19.1* 22.1* 26.5*  INR 1.6* 1.9* 2.4*  CREATININE 1.43* 1.48* 1.79*    Estimated Creatinine Clearance: 37.3 mL/min (A) (by C-G formula based on SCr of 1.79 mg/dL (H)).   Medical History: Past Medical History:  Diagnosis Date  . Atrial fibrillation ([place])   . BPH (benign prostatic hyperplasia)   . Cancer ([place])    liver  . CHF (congestive heart failure) ([place])   . Chronic kidney disease   . Cirrhosis ([place])   . Diabetes mellitus without complication ([place])   . GERD (gastroesophageal reflux disease)   . Hepatitis C   . Hx of liver transplant ([place])   . Pulmonary HTN ([place])     Assessment: Pharmacy has been consulted for Warfarin dosing and monitoring in 70yo patient with history of Atrial Fibrillation.   Patient has been taking warfarin PTA with their home dose being 2.5mg  daily. 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  DDI: amiodarone and cyclosporine  Date INR Plan  5/31 1.1 Held  6/1 N/A 3.75 mg  6/2 1.2 4 mg  6/3 1.6 5 mg  6/4 1.9 HELD  6/5 2.4 HOLD    Goal of Therapy:  INR 2-3   Plan:  --INR is therapeutic.  - Now holding warfarin therapy for upcoming EGD per GI --INR daily --CBC at least every 3 days per protocol   Lu Duffel, PharmD, BCPS Clinical Pharmacist 08/16/2020 10:15 AM

## 2020-08-16 NOTE — Progress Notes (Signed)
Vonda Antigua, MD [address] [place], [place], [place], [address] [address], [place], [place], [place], [address] Phone: [phone]  Fax: [phone]   Subjective: Patient sitting up in bed comfortably.  She denies any abdominal pain, nausea or vomiting.   Objective: Exam: Vital signs in last 24 hours: Vitals:   08/16/20 0350 08/16/20 0500 08/16/20 0749 08/16/20 1205  BP: (!) 114/49  140/68 131/62  Pulse: (!) 55  (!) 57 (!) 58  Resp: 18  18 18   Temp: 98.2 F (36.8 C)  97.7 F (36.5 C) 97.9 F (36.6 C)  TempSrc: Oral  Oral   SpO2: 98%  95% 99%  Weight:  72.4 kg    Height:       Weight change:   Intake/Output Summary (Last 24 hours) at 08/16/2020 1219 Last data filed at 08/16/2020 0900 Gross per 24 hour  Intake 480 ml  Output 0 ml  Net 480 ml    General: No acute distress, AAO x3 Abd: Soft, NT/ND, No HSM Skin: Warm, no rashes Neck: Supple, Trachea midline   Lab Results: Lab Results  Component Value Date   WBC 5.3 08/15/2020   HGB 11.1 (L) 08/15/2020   HCT 32.6 (L) 08/15/2020   MCV 90.8 08/15/2020   PLT 107 (L) 08/15/2020   Micro Results: Recent Results (from the past 240 hour(s))  Resp Panel by RT-PCR (Flu A&B, Covid) Nasopharyngeal Swab     Status: None   Collection Time: 08/10/20 12:08 AM   Specimen: Nasopharyngeal Swab; Nasopharyngeal(NP) swabs in vial transport medium  Result Value Ref Range Status   SARS Coronavirus 2 by RT PCR NEGATIVE NEGATIVE Final    Comment: (NOTE) SARS-CoV-2 target nucleic acids are NOT DETECTED.  The SARS-CoV-2 RNA is generally detectable in upper respiratory specimens during the acute phase of infection. The lowest concentration of SARS-CoV-2 viral copies this assay can detect is 138 copies/mL. A negative result does not preclude SARS-Cov-2 infection and should not be used as the sole basis for treatment or other patient management decisions. A negative result may occur with  improper specimen  collection/handling, submission of specimen other than nasopharyngeal swab, presence of viral mutation(s) within the areas targeted by this assay, and inadequate number of viral copies(<138 copies/mL). A negative result must be combined with clinical observations, patient history, and epidemiological information. The expected result is Negative.  Fact Sheet for Patients:  EntrepreneurPulse.com.au  Fact Sheet for Healthcare Providers:  IncredibleEmployment.be  This test is no t yet approved or cleared by the [place] FDA and  has been authorized for detection and/or diagnosis of SARS-CoV-2 by FDA under an Emergency Use Authorization (EUA). This EUA will remain  in effect (meaning this test can be used) for the duration of the COVID-19 declaration under Section 564(b)(1) of the Act, 21 U.S.C.section 360bbb-3(b)(1), unless the authorization is terminated  or revoked sooner.       Influenza A by PCR NEGATIVE NEGATIVE Final   Influenza B by PCR NEGATIVE NEGATIVE Final    Comment: (NOTE) The Xpert Xpress SARS-CoV-2/FLU/RSV plus assay is intended as an aid in the diagnosis of influenza from Nasopharyngeal swab specimens and should not be used as a sole basis for treatment. Nasal washings and aspirates are unacceptable for Xpert Xpress SARS-CoV-2/FLU/RSV testing.  Fact Sheet for Patients: EntrepreneurPulse.com.au  Fact Sheet for Healthcare Providers: IncredibleEmployment.be  This test is not yet approved or cleared by the [place] FDA and has been authorized for detection and/or diagnosis of SARS-CoV-2  by FDA under an Emergency Use Authorization (EUA). This EUA will remain in effect (meaning this test can be used) for the duration of the COVID-19 declaration under Section 564(b)(1) of the Act, 21 U.S.C. section 360bbb-3(b)(1), unless the authorization is terminated or revoked.  Performed at [facility], [address] [place], [place] [address]   MRSA PCR Screening     Status: None   Collection Time: 08/11/20  2:17 AM   Specimen: Nasopharyngeal  Result Value Ref Range Status   MRSA by PCR NEGATIVE NEGATIVE Final    Comment:        The GeneXpert MRSA Assay (FDA approved for NASAL specimens only), is one component of a comprehensive MRSA colonization surveillance program. It is not intended to diagnose MRSA infection nor to guide or monitor treatment for MRSA infections. Performed at [facility], [address] [place], [place] [address]    Studies/Results: [place] ESOPHAGUS W DOUBLE CM (HD)  Result Date: 08/14/2020 CLINICAL DATA:  Difficulty swallowing, pills getting stuck EXAM: ESOPHOGRAM/BARIUM SWALLOW TECHNIQUE: Single contrast examination was performed using thin barium soluble. The patient was observed with fluoroscopy swallowing a 13 mm barium sulphate tablet. FLUOROSCOPY TIME:  Fluoroscopy Time:  2 minutes 12 seconds Radiation Exposure Index (if provided by the fluoroscopic device): 19.6 mGy COMPARISON:  None. FINDINGS: Somewhat technically limited by patient positioning. Normal pharyngeal contours. There is transient penetration but no aspiration. Normal esophageal motility. Relative narrowing of the distal esophagus without holdup of contrast. However, barium pill was held up at this location despite multiple swallows of water. Gastroesophageal reflux is present. No hiatal hernia. IMPRESSION: Relative narrowing of the distal esophagus. This did not holdup passage of contrast but did prevent passage of barium pill despite several swallows of water. Consider endoscopic evaluation. Gastrostomy of reflux. Electronically Signed   By: Macy Mis M.D.   On: 08/14/2020 13:00   Medications:  Scheduled Meds: . amiodarone  200 mg Oral Daily  . Chlorhexidine Gluconate Cloth  6 each Topical Daily  . cycloSPORINE modified  200 mg Oral Daily  .  dextromethorphan-guaiFENesin  1 tablet Oral BID  . DULoxetine  60 mg Oral QHS  . insulin glargine  12 Units Subcutaneous Q24H  . lactulose  10 g Oral BID  . melatonin  2.5 mg Oral QHS  . metoprolol succinate  25 mg Oral BID  . mirtazapine  15 mg Oral QHS  . mycophenolate  500 mg Oral BID  . pantoprazole  40 mg Oral Daily  . rosuvastatin  5 mg Oral Daily  . sildenafil  40 mg Oral TID  . sodium bicarbonate  650 mg Oral TID  . sodium chloride flush  10 mL Intravenous Q12H  . Warfarin - Pharmacist Dosing Inpatient   Does not apply q1600   Continuous Infusions: . sodium chloride     PRN Meds:.acetaminophen, dextrose, haloperidol lactate, LORazepam, metoprolol tartrate, polyvinyl alcohol   Assessment: Principal Problem:   Type 2 diabetes mellitus with hyperosmolar hyperglycemic state (HHS) ([place]) Active Problems:   Atrial fibrillation (HCC)   HFrEF (heart failure with reduced ejection fraction) (HCC)   Hx of liver transplant ([place])   CKD (chronic kidney disease), stage III ([place])    Plan: Patient will need cardiology evaluation and clearance since they were planning outpatient stress test for elevated troponin after his last admission  After optimized from a cardiology standpoint and cleared, upper endoscopy can be done once INR is less than 1.5  Above communicated with Dr.  Amin   LOS: 5 days   Vonda Antigua, MD 08/16/2020, 12:19 PM

## 2020-08-17 ENCOUNTER — Ambulatory Visit: Admit: 2020-08-17 | Payer: MEDICARE

## 2020-08-17 DIAGNOSIS — I4891 Unspecified atrial fibrillation: Secondary | ICD-10-CM

## 2020-08-17 DIAGNOSIS — Z01810 Encounter for preprocedural cardiovascular examination: Secondary | ICD-10-CM

## 2020-08-17 LAB — GLUCOSE, CAPILLARY
Glucose-Capillary: 199 mg/dL — ABNORMAL HIGH (ref 70–99)
Glucose-Capillary: 283 mg/dL — ABNORMAL HIGH (ref 70–99)
Glucose-Capillary: 232 mg/dL — ABNORMAL HIGH (ref 70–99)
Glucose-Capillary: 193 mg/dL — ABNORMAL HIGH (ref 70–99)

## 2020-08-17 LAB — PROTIME-INR
INR: 2.3 — ABNORMAL HIGH (ref 0.8–1.2)
Prothrombin Time: 25 seconds — ABNORMAL HIGH (ref 11.4–15.2)

## 2020-08-17 MED ORDER — INSULIN ASPART 100 UNIT/ML IJ SOLN: 0.0000 [IU] | Freq: Every day | INTRAMUSCULAR | Status: DC

## 2020-08-17 MED ORDER — INSULIN ASPART 100 UNIT/ML IJ SOLN
0.0000 [IU] | Freq: Three times a day (TID) | INTRAMUSCULAR | Status: DC
  Administered 2020-08-17 – 2020-08-18 (×2): 3 [IU] via SUBCUTANEOUS
  Administered 2020-08-18 – 2020-08-20 (×3): 2 [IU] via SUBCUTANEOUS
  Filled 2020-08-17 (×5): qty 1

## 2020-08-17 NOTE — Progress Notes (Signed)
Hector Antigua, MD [address] [place], [place], [place], [address] [address], [place], [place], [place], [address] Phone: [phone]  Fax: [phone]   Subjective: Pt sitting up in bed comfortably.  Denies any abdominal pain   Objective: Exam: Vital signs in last 24 hours: Vitals:   08/17/20 0416 08/17/20 0500 08/17/20 0726 08/17/20 1120  BP: (!) 129/94  122/60 126/60  Pulse: (!) 57  (!) 55 (!) 58  Resp: 14  15 16   Temp: 97.9 F (36.6 C)   97.6 F (36.4 C)  TempSrc: Oral   Oral  SpO2: 98%  95% 97%  Weight:  66.7 kg    Height:       Weight change: -5.7 kg  Intake/Output Summary (Last 24 hours) at 08/17/2020 1624 Last data filed at 08/17/2020 1300 Gross per 24 hour  Intake 600 ml  Output 0 ml  Net 600 ml    General: No acute distress, AAO x3 Abd: Soft, NT/ND, No HSM Skin: Warm, no rashes Neck: Supple, Trachea midline   Lab Results: Lab Results  Component Value Date   WBC 5.3 08/15/2020   HGB 11.1 (L) 08/15/2020   HCT 32.6 (L) 08/15/2020   MCV 90.8 08/15/2020   PLT 107 (L) 08/15/2020   Micro Results: Recent Results (from the past 240 hour(s))  Resp Panel by RT-PCR (Flu A&B, Covid) Nasopharyngeal Swab     Status: None   Collection Time: 08/10/20 12:08 AM   Specimen: Nasopharyngeal Swab; Nasopharyngeal(NP) swabs in vial transport medium  Result Value Ref Range Status   SARS Coronavirus 2 by RT PCR NEGATIVE NEGATIVE Final    Comment: (NOTE) SARS-CoV-2 target nucleic acids are NOT DETECTED.  The SARS-CoV-2 RNA is generally detectable in upper respiratory specimens during the acute phase of infection. The lowest concentration of SARS-CoV-2 viral copies this assay can detect is 138 copies/mL. A negative result does not preclude SARS-Cov-2 infection and should not be used as the sole basis for treatment or other patient management decisions. A negative result may occur with  improper specimen collection/handling, submission of specimen  other than nasopharyngeal swab, presence of viral mutation(s) within the areas targeted by this assay, and inadequate number of viral copies(<138 copies/mL). A negative result must be combined with clinical observations, patient history, and epidemiological information. The expected result is Negative.  Fact Sheet for Patients:  EntrepreneurPulse.com.au  Fact Sheet for Healthcare Providers:  IncredibleEmployment.be  This test is no t yet approved or cleared by the [place] FDA and  has been authorized for detection and/or diagnosis of SARS-CoV-2 by FDA under an Emergency Use Authorization (EUA). This EUA will remain  in effect (meaning this test can be used) for the duration of the COVID-19 declaration under Section 564(b)(1) of the Act, 21 U.S.C.section 360bbb-3(b)(1), unless the authorization is terminated  or revoked sooner.       Influenza A by PCR NEGATIVE NEGATIVE Final   Influenza B by PCR NEGATIVE NEGATIVE Final    Comment: (NOTE) The Xpert Xpress SARS-CoV-2/FLU/RSV plus assay is intended as an aid in the diagnosis of influenza from Nasopharyngeal swab specimens and should not be used as a sole basis for treatment. Nasal washings and aspirates are unacceptable for Xpert Xpress SARS-CoV-2/FLU/RSV testing.  Fact Sheet for Patients: EntrepreneurPulse.com.au  Fact Sheet for Healthcare Providers: IncredibleEmployment.be  This test is not yet approved or cleared by the [place] FDA and has been authorized for detection and/or diagnosis of SARS-CoV-2 by FDA under an Emergency Use  Authorization (EUA). This EUA will remain in effect (meaning this test can be used) for the duration of the COVID-19 declaration under Section 564(b)(1) of the Act, 21 U.S.C. section 360bbb-3(b)(1), unless the authorization is terminated or revoked.  Performed at [facility], [address]  [place], [place] [address]   MRSA PCR Screening     Status: None   Collection Time: 08/11/20  2:17 AM   Specimen: Nasopharyngeal  Result Value Ref Range Status   MRSA by PCR NEGATIVE NEGATIVE Final    Comment:        The GeneXpert MRSA Assay (FDA approved for NASAL specimens only), is one component of a comprehensive MRSA colonization surveillance program. It is not intended to diagnose MRSA infection nor to guide or monitor treatment for MRSA infections. Performed at [facility], [address] [place], [place] [address]    Studies/Results: No results found. Medications:  Scheduled Meds: . amiodarone  200 mg Oral Daily  . Chlorhexidine Gluconate Cloth  6 each Topical Daily  . cycloSPORINE modified  200 mg Oral Daily  . dextromethorphan-guaiFENesin  1 tablet Oral BID  . DULoxetine  60 mg Oral QHS  . insulin aspart  0-5 Units Subcutaneous QHS  . insulin aspart  0-9 Units Subcutaneous TID WC  . insulin glargine  12 Units Subcutaneous Q24H  . lactulose  10 g Oral BID  . melatonin  2.5 mg Oral QHS  . metoprolol succinate  25 mg Oral BID  . mirtazapine  15 mg Oral QHS  . mycophenolate  500 mg Oral BID  . pantoprazole  40 mg Oral Daily  . rosuvastatin  5 mg Oral Daily  . sildenafil  40 mg Oral TID  . sodium bicarbonate  650 mg Oral TID  . sodium chloride flush  10 mL Intravenous Q12H  . Warfarin - Pharmacist Dosing Inpatient   Does not apply q1600   Continuous Infusions: . sodium chloride     PRN Meds:.acetaminophen, dextrose, haloperidol lactate, LORazepam, metoprolol tartrate, polyvinyl alcohol   Assessment: Principal Problem:   Type 2 diabetes mellitus with hyperosmolar hyperglycemic state (HHS) ([place]) Active Problems:   Atrial fibrillation (HCC)   HFrEF (heart failure with reduced ejection fraction) (HCC)   Hx of liver transplant ([place])   CKD (chronic kidney disease), stage III ([place]) Dysphagia   Plan: Patient has been pending cardiology evaluation and  clearance prior to endoscopic procedures and therefore endoscopy has been delayed for this reason  INR will need to be less than 1.5 prior to endoscopic procedures, but continuation of Coumadin while awaiting cardiology clearance is up to primary team.  Since this is an elective procedure for dysphagia, and as was discussed previously with Dr. Reesa Chew over the weekend risks and benefits of holding Coumadin while awaiting cardiology clearance was deferred to primary team and cardiology, as timing of cardiology evaluation and clearance could take time  At this time it appears that a stress test is planned for tomorrow and once cleared from their standpoint and INR is less than 1.5 can proceed with endoscopy for dysphagia     LOS: 6 days   Hector Antigua, MD 08/17/2020, 4:24 PM

## 2020-08-17 NOTE — Consult Note (Signed)
Cardiology Consultation:   Patient ID: Hector Mueller MRN: [id number]; DOB: [birth year]  Admit date: 08/10/2020 Date of Consult: 08/17/2020  PCP:  [name], MD   [place]  Cardiologist:  Dr. Saunders Revel  Advanced Practice Provider:  No care team member to display Electrophysiologist:  None [id number]    Patient Profile:   Hector Mueller is a 70 y.o. male with a hx of sick sinus syndrome s/p Micra pacemaker (09/2016), HFrEF, PAF s/p recent DCCV, pulmonary hypertension, cirrhosis s/p liver transplant, DM2, hyperlipidemia, CKD, and with radiation admission for acute hypoxic respiratory failure secondary to altered mental status in the setting of HHS and seen by cardiology for atrial fibrillation with rapid ventricular rate and associated shock requiring emergent cardioversion 07/13/2020, who is being seen today for the evaluation of preoperative cardiac evaluation at the request of Dr. Reesa Chew.  History of Present Illness:   Hector Mueller is a 70 year old male with PMH as above.  Previous admission 5/2-5/7 for atrial fibrillation with rapid ventricular response and possible CVA.  He did have altered mental status during admission with initial concern for stroke versus HES.  He was successfully extubated without focal neurologic complaints.  Head CT without acute stroke with further recommendations deferred to primary team. During this same 07/2020 admission, high-sensitivity troponin was elevated with EF 40 to 45%.  In the setting of his AKI and recovering mental status, further ischemic evaluation deferred with recommendation for consideration of future ischemic work-up as an outpatient and once recovered from his comorbids.  His admission did become complicated by atrial fibrillation with rapid ventricular response and shock, requiring emergent cardioversion.  He was cardioverted 07/13/2020.  He was started on IV amiodarone, transition to oral amiodarone.  He was discharged on oral  amiodarone taper and heparin to Coumadin bridge.  Given his history of liver disease and prior anticoagulation with warfarin, warfarin was the anticoagulation prescribed at discharge.    Since that time, he has not been seen in the office. He has been lost to follow-up, though there is a heart failure clinic note from 07/30/2018.  Today, 08/17/2020, cardiology is consulted for preoperative cardiac evaluation in the setting of upcoming EGD.  He denies any current chest pain or shortness of breath at rest.  He notes shortness of breath that occurs with showering or walking some distance.  He reports he is able to complete his ADLs at home, including washing dishes and vacuuming.  He denies any chest pain with exertion.  No racing heart rate or palpitations.  No presyncope or syncope.  No recent falls.  No signs or symptoms of volume overload. No EKG yet this admission.   Past Medical History:  Diagnosis Date  . Atrial fibrillation ([place])   . BPH (benign prostatic hyperplasia)   . Cancer ([place])    liver  . CHF (congestive heart failure) ([place])   . Chronic kidney disease   . Cirrhosis ([place])   . Diabetes mellitus without complication ([place])   . GERD (gastroesophageal reflux disease)   . Hepatitis C   . Hx of liver transplant ([place])   . Pulmonary HTN ([place])     Past Surgical History:  Procedure Laterality Date  . ANKLE FRACTURE SURGERY Bilateral   . BACK SURGERY    . CLAVICLE SURGERY    . INTRAMEDULLARY (IM) NAIL INTERTROCHANTERIC Right 11/28/2017   Procedure: INTRAMEDULLARY (IM) NAIL INTERTROCHANTRIC;  Surgeon: Earnestine Leys, MD;  Location: [facility] ORS;  Service: Orthopedics;  Laterality: Right;  .  LIVER SURGERY       Home Medications:  Prior to Admission medications   Medication Sig Start Date End Date Taking? Authorizing Provider  bisacodyl (DULCOLAX) 5 MG EC tablet Take 5 mg by mouth daily as needed for moderate constipation.   Yes [provider]  chlorthalidone (HYGROTON) 25 MG  tablet Take 12.5 mg by mouth in the morning. 07/10/20 07/10/21 Yes [provider]  Cholecalciferol (VITAMIN D3) 125 MCG (5000 UT) CAPS Take 3,000 Units by mouth daily.   Yes [provider]  cycloSPORINE modified (NEORAL) 100 MG capsule Take 200 mg by mouth daily.   Yes [provider]  cycloSPORINE modified (NEORAL) 25 MG capsule Take 175 mg by mouth every evening.   Yes [provider]  DULoxetine (CYMBALTA) 30 MG capsule Take 60 mg by mouth at bedtime. 05/22/20  Yes [provider]  Melatonin 3 MG TABS Take 3 mg by mouth at bedtime. 02/06/17  Yes [provider]  mirtazapine (REMERON) 15 MG tablet Take 15 mg by mouth at bedtime.  10/18/17  Yes [provider]  mycophenolate (CELLCEPT) 250 MG capsule Take 500 mg by mouth in the morning and at bedtime. 04/14/20 04/14/21 Yes [provider]  OZEMPIC, 1 MG/DOSE, 4 MG/3ML SOPN Inject 1 mg into the skin once a week. 05/19/20  Yes [provider]  pregabalin (LYRICA) 75 MG capsule Take 75 mg by mouth in the morning and at bedtime. 05/15/19  Yes [provider]  rosuvastatin (CRESTOR) 5 MG tablet Take 5 mg by mouth daily. 06/09/20  Yes [provider]  sildenafil (REVATIO) 20 MG tablet Take 40 mg by mouth 3 (three) times daily.  10/17/17  Yes [provider]  sodium bicarbonate 650 MG tablet Take 650 mg by mouth in the morning and at bedtime. 03/18/20 03/18/21 Yes [provider]  Specialty Vitamins Products (MAGNESIUM, AMINO ACID CHELATE,) 133 MG tablet Take 2 tablets by mouth 2 (two) times daily. 03/02/17  Yes [provider]  alendronate (FOSAMAX) 70 MG tablet Take 70 mg by mouth once a week. Take with a full glass of water on an empty stomach.    [provider]  amiodarone (PACERONE) 200 MG tablet Take 1 tablet (200 mg total) by mouth 2 (two) times daily for 7 days, THEN 1 tablet (200 mg total) daily. 07/21/20 08/27/20  Sidney Ace, MD  Insulin Glargine (BASAGLAR KWIKPEN) 100 UNIT/ML Inject 12 Units into the skin daily as needed. Patient not taking: No sig reported    [provider]  metoprolol succinate (TOPROL-XL) 25 MG 24 hr tablet Take 1 tablet (25 mg total) by mouth 2 (two) times daily. 07/20/20 08/19/20  Sidney Ace, MD  omeprazole (PRILOSEC) 20 MG capsule Take 20 mg by mouth daily.    [provider]  ondansetron (ZOFRAN) 4 MG tablet Take 1 tablet (4 mg total) by mouth every 6 (six) hours as needed for nausea. 12/01/17   Vaughan Basta, MD  OZEMPIC, 0.25 OR 0.5 MG/DOSE, 2 MG/1.5ML SOPN Inject 0.5 mg into the skin once a week. Patient not taking: Reported on 08/11/2020 05/04/20   [provider]  Tacrine Hydrochloride (COGNEX PO) Take 2 capsules by mouth daily at 6 (six) AM. With a glass of water    [provider]  warfarin (COUMADIN) 2.5 MG tablet Take 1 tablet (2.5 mg total) by mouth daily. 07/20/20 07/20/21  Sidney Ace, MD    Inpatient Medications: Scheduled Meds: .  amiodarone  200 mg Oral Daily  . Chlorhexidine Gluconate Cloth  6 each Topical Daily  . cycloSPORINE modified  200 mg Oral Daily  . dextromethorphan-guaiFENesin  1 tablet Oral BID  . DULoxetine  60 mg Oral QHS  . insulin glargine  12 Units Subcutaneous Q24H  . lactulose  10 g Oral BID  . melatonin  2.5 mg Oral QHS  . metoprolol succinate  25 mg Oral BID  . mirtazapine  15 mg Oral QHS  . mycophenolate  500 mg Oral BID  . pantoprazole  40 mg Oral Daily  . rosuvastatin  5 mg Oral Daily  . sildenafil  40 mg Oral TID  . sodium bicarbonate  650 mg Oral TID  . sodium chloride flush  10 mL Intravenous Q12H  . Warfarin - Pharmacist Dosing Inpatient   Does not apply q1600   Continuous Infusions: . sodium chloride     PRN Meds: acetaminophen, dextrose, haloperidol lactate, LORazepam, metoprolol tartrate, polyvinyl alcohol  Allergies:   No Known Allergies  Social History:   Social History    Socioeconomic History  . Marital status: Married    Spouse name: Not on file  . Number of children: Not on file  . Years of education: Not on file  . Highest education level: Not on file  Occupational History  . Not on file  Tobacco Use  . Smoking status: Never Smoker  . Smokeless tobacco: Never Used  Vaping Use  . Vaping Use: Never used  Substance and Sexual Activity  . Alcohol use: No  . Drug use: Never  . Sexual activity: Not Currently  Other Topics Concern  . Not on file  Social History Narrative  . Not on file   Social Determinants of Health   Financial Resource Strain: Not on file  Food Insecurity: Not on file  Transportation Needs: Not on file  Physical Activity: Not on file  Stress: Not on file  Social Connections: Not on file  Intimate Partner Violence: Not on file    Family History:   History reviewed. No pertinent family history.   ROS:  Please see the history of present illness.  Review of Systems  Respiratory:       Reports dyspnea but no shortness of breath at rest  Cardiovascular: Negative for chest pain, palpitations, orthopnea and leg swelling.    All other ROS reviewed and negative.     Physical Exam/Data:   Vitals:   08/17/20 0416 08/17/20 0500 08/17/20 0726 08/17/20 1120  BP: (!) 129/94  122/60 126/60  Pulse: (!) 57  (!) 55 (!) 58  Resp: 14  15 16   Temp: 97.9 F (36.6 C)   97.6 F (36.4 C)  TempSrc: Oral   Oral  SpO2: 98%  95% 97%  Weight:  66.7 kg    Height:        Intake/Output Summary (Last 24 hours) at 08/17/2020 1334 Last data filed at 08/17/2020 1017 Gross per 24 hour  Intake 480 ml  Output 0 ml  Net 480 ml   Last 3 Weights 08/17/2020 08/16/2020 08/13/2020  Weight (lbs) 147 lb 0.8 oz 159 lb 9.8 oz 147 lb 0.8 oz  Weight (kg) 66.7 kg 72.4 kg 66.7 kg     Body mass index is 21.72 kg/m.  General:  Well nourished, well developed, in no acute distress.  Seated in chair next to bed to Bed. HEENT: normal Lymph: no adenopathy Neck:  no JVD Endocrine:  No thryomegaly Vascular: No carotid  bruits; FA pulses 2+ bilaterally without bruits  Cardiac:  normal S1, S2; RRR; no murmur  Lungs:  clear to auscultation bilaterally, no wheezing, rhonchi or rales  Abd: soft, nontender, no hepatomegaly  Ext: no edema Musculoskeletal:  No deformities, BUE and BLE strength normal and equal Skin: warm and dry  Neuro:  CNs 2-12 intact, no focal abnormalities noted Psych:  Normal affect   EKG:  The EKG was personally reviewed and demonstrates:  No EKG yet this admission Telemetry:  Telemetry was personally reviewed and demonstrates: Not on telemetry  Relevant CV Studies: Echo 07/13/20 1. Left ventricular ejection fraction, by estimation, is 40 to 45%. The  left ventricle has mild to moderately decreased function. The left  ventricle demonstrates global hypokinesis. Left ventricular diastolic  parameters are consistent with Grade I  diastolic dysfunction (impaired relaxation).  2. Right ventricular systolic function is normal. The right ventricular  size is normal.  3. The mitral valve is grossly normal. No evidence of mitral valve  regurgitation.  4. The aortic valve was not well visualized. Aortic valve regurgitation  is not visualized.  5. Pulmonic valve regurgitation not assessed.   Laboratory Data:  High Sensitivity Troponin:  No results for input(s): TROPONINIHS in the last 720 hours.   Chemistry Recent Labs  Lab 08/14/20 0531 08/15/20 0427 08/16/20 0440  NA 138 136 137  K 3.6 3.8 4.4  CL 108 106 106  CO2 22 21* 23  GLUCOSE 110* 144* 148*  BUN 13 17 23   CREATININE 1.43* 1.48* 1.79*  CALCIUM 8.7* 9.0 8.9  GFRNONAA 52* 50* 40*  ANIONGAP 8 9 8     Recent Labs  Lab 08/10/20 2251  PROT 6.9  ALBUMIN 3.8  AST 12*  ALT 13  ALKPHOS 88  BILITOT 1.0   Hematology Recent Labs  Lab 08/11/20 0304 08/12/20 0336 08/15/20 0427  WBC 7.8 4.2 5.3  RBC 4.10* 3.13* 3.59*  HGB 12.7* 9.8* 11.1*  HCT 36.5* 28.9*  32.6*  MCV 89.0 92.3 90.8  MCH 31.0 31.3 30.9  MCHC 34.8 33.9 34.0  RDW 13.9 14.6 14.1  PLT 138* 97* 107*   BNPNo results for input(s): BNP, PROBNP in the last 168 hours.  DDimer No results for input(s): DDIMER in the last 168 hours.   Radiology/Studies:  DG ESOPHAGUS W DOUBLE CM (HD)  Result Date: 08/14/2020 CLINICAL DATA:  Difficulty swallowing, pills getting stuck EXAM: ESOPHOGRAM/BARIUM SWALLOW TECHNIQUE: Single contrast examination was performed using thin barium soluble. The patient was observed with fluoroscopy swallowing a 13 mm barium sulphate tablet. FLUOROSCOPY TIME:  Fluoroscopy Time:  2 minutes 12 seconds Radiation Exposure Index (if provided by the fluoroscopic device): 19.6 mGy COMPARISON:  None. FINDINGS: Somewhat technically limited by patient positioning. Normal pharyngeal contours. There is transient penetration but no aspiration. Normal esophageal motility. Relative narrowing of the distal esophagus without holdup of contrast. However, barium pill was held up at this location despite multiple swallows of water. Gastroesophageal reflux is present. No hiatal hernia. IMPRESSION: Relative narrowing of the distal esophagus. This did not holdup passage of contrast but did prevent passage of barium pill despite several swallows of water. Consider endoscopic evaluation. Gastrostomy of reflux. Electronically Signed   By: Macy Mis M.D.   On: 08/14/2020 13:00     Assessment and Plan:   Preoperative cardiac evaluation -- Denies any current chest pain or shortness of breath at rest.  Reports dyspnea with exertion, which has been stable and ongoing for some time.  Recent echo  during previous admission 07/13/2020 with EF 40 to 45%, global hypokinesis, G1DD.  At the time, further work-up deferred given AKI and AMS.  Recommendation was for outpatient MPI once recovered from his acute illness comorbids.  Today, cardiology is consulted for preoperative cardiac evaluation prior to endoscopy.   Most recent creatinine below 2.0 at 1.79.  Daily BMET to ensure remains below 2.0. He is on insulin.  Euvolemic and well compensated on exam.  We will update an EKG to reassess for any acute changes and reassess QTC.  Per internal medicine, endoscopy delayed pending decrease in INR. Defer holding of coumadin and restart of anticoagulation to pharmacy as below with restart after the procedure once felt safe to do so and if no further procedures.  Given this delay in endoscopy procedure, we could certainly stress test him tomorrow morning to ensure no high risk ischemia and given previous echo as above with reduced EF.  At this time, RCRI points 2 with class III - IV risk and 6.6% - 11% risk of MACE. Further recommendations if needed after MPI.   Atrial fibrillation with RVR -- S/p recent admission with emergent cardioversion performed 07/13/2020.  Not on telemetry and no recent EKG.  Will order repeat EKG as above.  Continue rate control with Toprol 25 mg twice daily.  Continue amiodarone 200 mg daily.  Will update an EKG to reassess QTC.  S/p DCCV 07/13/2020.  He has been maintained on warfarin since that time with at least 1 month of anticoagulation after his recent cardioversion procedure.  We will defer management of INR to pharmacy given upcoming GI procedure with restart of anticoagulation after procedure once felt safe to do so and if no further procedures planned at that time.  Cardiomyopathy -- Reports dyspnea with exertion.  Previous echo with reduced EF 40 to 45%.  N.p.o. after midnight with plan for [facility] tomorrow morning 08/18/2020.  Euvolemic and well compensated on exam.  Continue current medications.  Escalation of GDMT limited by renal function.  No ACE/ARB/Arni/MRA/SGLT2 inhibitor in the setting of CKD.  CKD stage IV -- Daily BMET.  Caution with nephrotoxins.  DM2 --SSI, per IM.   For questions or updates, please contact [place] Please consult [email or web address] for contact  info under    Signed, Arvil Chaco, PA-C  08/17/2020 1:34 PM

## 2020-08-17 NOTE — Progress Notes (Signed)
Inpatient Diabetes Program Recommendations  AACE/ADA: New Consensus Statement on Inpatient Glycemic Control (2015)  Target Ranges:  Prepandial:   less than 140 mg/dL      Peak postprandial:   less than 180 mg/dL (1-2 hours)      Critically ill patients:  140 - 180 mg/dL   Results for SANDFORD, DIOP (MRN [id number]) as of 08/17/2020 08:19  Ref. Range 08/16/2020 07:49 08/16/2020 12:07 08/16/2020 16:19 08/16/2020 21:05  Glucose-Capillary Latest Ref Range: 70 - 99 mg/dL 139 (H)  12 units LANTUS @8 :45am 240 (H) 240 (H) 238 (H)   Results for FREDY, GLADU (MRN [id number]) as of 08/17/2020 08:19  Ref. Range 08/17/2020 07:31  Glucose-Capillary Latest Ref Range: 70 - 99 mg/dL 199 (H)     Home DM Meds: Basaglar 12 units Daily       Ozempic 1 mg Qweek       (note reports patient not taking either meds)  Current Orders: Lantus 12 units Q24 hours    Endocrinologist: Dr. Ronnald Ramp with [facility] Last seen 05/18/2020 Was told to Stop Insulin and Take Ozempic 1 mg Qweek    MD- Unsure why Novolog SSi stopped on 06/04?  Currently no orders for Novolog SSI  Please consider restarting Novolog 0-9 units TID AC + HS    --Will follow patient during hospitalization--  Wyn Quaker RN, MSN, CDE Diabetes Coordinator Inpatient Glycemic Control Team Team Pager: [phone] (8a-5p)

## 2020-08-17 NOTE — Progress Notes (Signed)
PROGRESS NOTE    Hector Mueller  ZOX:[id number] DOB: [birth year] DOA: 08/10/2020 PCP: [name], MD   Brief Narrative: Taken from H&P and prior notes. Hector Mueller is a 70 y.o. 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 Found to have CBG of 859, admitted for hyperosmolar hyperglycemia, initially managed with insulin infusion. Due to worsening agitation he was started on Precedex infusion, later transitioned off with improvement in mental status and agitation. Significantly improved-PT is recommending SNF placement. Esophageal gram was obtained due to his complaint of food sticking in chest and causing pain, it shows narrowing at distal esophagus, recommending endoscopy.  GI was consulted-   Holding Coumadin for now for possible EGD earlier next week. GI is recommending doing EGD during weekdays for better anesthesia support.  Patient has prior complicated history of intubations and a vocal cord paralysis.  Cardiology was consulted for preanesthesia cardiac clearance as requested by GI-going for Myoview tomorrow.  INR still at 2.3, GI wants it below 1.5 to proceed with EGD.  Subjective: Patient has no new complaint today.  Denies any chest pain or shortness of breath.  Continues to have some dysphagia.  Assessment & Plan:   Principal Problem:   Type 2 diabetes mellitus with hyperosmolar hyperglycemic state (HHS) ([place]) Active Problems:   Atrial fibrillation (HCC)   HFrEF (heart failure with reduced ejection fraction) (HCC)   Hx of liver transplant ([place])   CKD (chronic kidney disease), stage III (HCC)  Acute metabolic  encephalopathy.  Resolved  Most likely related to HHS, became quite agitated and somnolent.  PCO2 normal on ABG.  TSH mildly elevated at 4.5, ammonia levels within normal limit, UA with some proteinuria but no pyuria or bacteriuria, UDS negative. Precedex infusion has been discontinued appears at baseline now. -Continue to monitor  Dysphagia.  Patient's symptoms were concerning for dysphagia, esophageal gram was obtained which shows distal esophageal narrowing and inability to pass pill and they were recommending endoscopic evaluation.  GI was consulted  GI wants to keep holding Coumadin and plan for EGD earlier during the week for better anesthesia support due to his complicated past medical history. INR of 2.3 today, last dose of Coumadin was on 08/14/2020. Cardiology was consulted for preprocedural cardiac clearance as requested by GI and patient will be going for Myoview tomorrow.  Paroxysmal atrial fibrillation.  Currently in sinus rhythm.  History of DCCV on 07/13/2020. -Continue home metoprolol -Holding Coumadin for EGD-INR was subtherapeutic not sure whether patient was taking it, per patient he was missing doses as keep forgetting to take his medications.  Uncontrolled type 2 diabetes mellitus with HHS.  CBG currently within goal. -Continue with SSI -Continue Lantus 12 units daily  CKD stage IIIb.  Creatinine seems improved from his baseline with IV fluid, currently stable. IV fluids were discontinued. -Monitor renal function -Avoid nephrotoxins  History of liver cirrhosis secondary to hep C s/p liver transplant in 2018. Ammonia levels within normal limit and LFTs stable. -Continue home meds.  Anemia of chronic disease/thrombocytopenia.  Seems stable. -Continue to monitor  Chronic HFrEF with right heart failure/pulmonary hypertension.  Appears well compensated. -Continue home meds -Daily weight and BMP  Objective: Vitals:   08/17/20 0416  08/17/20 0500 08/17/20 0726 08/17/20  1120  BP: (!) 129/94  122/60 126/60  Pulse: (!) 57  (!) 55 (!) 58  Resp: 14  15 16   Temp: 97.9 F (36.6 C)   97.6 F (36.4 C)  TempSrc: Oral   Oral  SpO2: 98%  95% 97%  Weight:  66.7 kg    Height:        Intake/Output Summary (Last 24 hours) at 08/17/2020 1449 Last data filed at 08/17/2020 1300 Gross per 24 hour  Intake 600 ml  Output 0 ml  Net 600 ml   Filed Weights   08/13/20 0402 08/16/20 0500 08/17/20 0500  Weight: 66.7 kg 72.4 kg 66.7 kg    Examination:  General.  Frail elderly man, in no acute distress. Pulmonary.  Lungs clear bilaterally, normal respiratory effort. CV.  Regular rate and rhythm, no JVD, rub or murmur. Abdomen.  Soft, nontender, nondistended, BS positive. CNS.  Alert and oriented .  No focal neurologic deficit. Extremities.  No edema, no cyanosis, pulses intact and symmetrical. Psychiatry.  Judgment and insight appears normal.  DVT prophylaxis: SCDs, Coumadin is  on hold Code Status: Full Family Communication: Discussed with patient Disposition Plan:  Status is: Inpatient  Remains inpatient appropriate because:Inpatient level of care appropriate due to severity of illness   Dispo: The patient is from: Home              Anticipated d/c is to: Home              Patient currently is not medically stable to d/c.   Difficult to place patient No              Level of care: Med-Surg  All the records are reviewed and case discussed with Care Management/Social Worker. Management plans discussed with the patient, nursing and they are in agreement.  Consultants:   PCCM  GI  Cardiology  Procedures:  Antimicrobials:   Data Reviewed: I have personally reviewed following labs and imaging studies  CBC: Recent Labs  Lab 08/10/20 2251 08/11/20 0304 08/12/20 0336 08/15/20 0427  WBC 5.0 7.8 4.2 5.3  NEUTROABS 3.6  --   --   --   HGB 11.9* 12.7* 9.8* 11.1*  HCT 35.7* 36.5* 28.9* 32.6*  MCV 91.8 89.0 92.3 90.8  PLT 127* 138* 97* 107*   Basic  Metabolic Panel: Recent Labs  Lab 08/11/20 1141 08/12/20 0336 08/13/20 0322 08/14/20 0531 08/15/20 0427 08/16/20 0440  NA  --  137 137 138 136 137  K  --  3.6 4.1 3.6 3.8 4.4  CL  --  106 108 108 106 106  CO2  --  25 23 22  21* 23  GLUCOSE  --  110* 187* 110* 144* 148*  BUN  --  20 15 13 17 23   CREATININE  --  1.43* 1.31* 1.43* 1.48* 1.79*  CALCIUM  --  8.4* 8.7* 8.7* 9.0 8.9  MG 2.2  --   --   --   --   --    GFR: Estimated Creatinine Clearance: 35.2 mL/min (A) (by C-G formula based on SCr of 1.79 mg/dL (H)). Liver Function Tests: Recent Labs  Lab 08/10/20 2251  AST 12*  ALT 13  ALKPHOS 88  BILITOT 1.0  PROT 6.9  ALBUMIN 3.8   No results for input(s): LIPASE, AMYLASE in the last 168 hours. Recent Labs  Lab 08/11/20 1141  AMMONIA 17   Coagulation Profile: Recent Labs  Lab 08/13/20 0322  08/14/20 0531 08/15/20 0427 08/16/20 0440 08/17/20 0508  INR 1.2 1.6* 1.9* 2.4* 2.3*   Cardiac Enzymes: No results for input(s): CKTOTAL, CKMB, CKMBINDEX, TROPONINI in the last 168 hours. BNP (last 3 results) No results for input(s): PROBNP in the last 8760 hours. HbA1C: No results for input(s): HGBA1C in the last 72 hours. CBG: Recent Labs  Lab 08/16/20 1207 08/16/20 1619 08/16/20 2105 08/17/20 0731 08/17/20 1134  GLUCAP 240* 240* 238* 199* 283*   Lipid Profile: No results for input(s): CHOL, HDL, LDLCALC, TRIG, CHOLHDL, LDLDIRECT in the last 72 hours. Thyroid Function Tests: No results for input(s): TSH, T4TOTAL, FREET4, T3FREE, THYROIDAB in the last 72 hours. Anemia Panel: No results for input(s): VITAMINB12, FOLATE, FERRITIN, TIBC, IRON, RETICCTPCT in the last 72 hours. Sepsis Labs: No results for input(s): PROCALCITON, LATICACIDVEN in the last 168 hours.  Recent Results (from the past 240 hour(s))  Resp Panel by RT-PCR (Flu A&B, Covid) Nasopharyngeal Swab     Status: None   Collection Time: 08/10/20 12:08 AM   Specimen: Nasopharyngeal Swab;  Nasopharyngeal(NP) swabs in vial transport medium  Result Value Ref Range Status   SARS Coronavirus 2 by RT PCR NEGATIVE NEGATIVE Final    Comment: (NOTE) SARS-CoV-2 target nucleic acids are NOT DETECTED.  The SARS-CoV-2 RNA is generally detectable in upper respiratory specimens during the acute phase of infection. The lowest concentration of SARS-CoV-2 viral copies this assay can detect is 138 copies/mL. A negative result does not preclude SARS-Cov-2 infection and should not be used as the sole basis for treatment or other patient management decisions. A negative result may occur with  improper specimen collection/handling, submission of specimen other than nasopharyngeal swab, presence of viral mutation(s) within the areas targeted by this assay, and inadequate number of viral copies(<138 copies/mL). A negative result must be combined with clinical observations, patient history, and epidemiological information. The expected result is Negative.  Fact Sheet for Patients:  EntrepreneurPulse.com.au  Fact Sheet for Healthcare Providers:  IncredibleEmployment.be  This test is no t yet approved or cleared by the [place] FDA and  has been authorized for detection and/or diagnosis of SARS-CoV-2 by FDA under an Emergency Use Authorization (EUA). This EUA will remain  in effect (meaning this test can be used) for the duration of the COVID-19 declaration under Section 564(b)(1) of the Act, 21 U.S.C.section 360bbb-3(b)(1), unless the authorization is terminated  or revoked sooner.       Influenza A by PCR NEGATIVE NEGATIVE Final   Influenza B by PCR NEGATIVE NEGATIVE Final    Comment: (NOTE) The Xpert Xpress SARS-CoV-2/FLU/RSV plus assay is intended as an aid in the diagnosis of influenza from Nasopharyngeal swab specimens and should not be used as a sole basis for treatment. Nasal washings and aspirates are unacceptable for Xpert Xpress  SARS-CoV-2/FLU/RSV testing.  Fact Sheet for Patients: EntrepreneurPulse.com.au  Fact Sheet for Healthcare Providers: IncredibleEmployment.be  This test is not yet approved or cleared by the [place] FDA and has been authorized for detection and/or diagnosis of SARS-CoV-2 by FDA under an Emergency Use Authorization (EUA). This EUA will remain in effect (meaning this test can be used) for the duration of the COVID-19 declaration under Section 564(b)(1) of the Act, 21 U.S.C. section 360bbb-3(b)(1), unless the authorization is terminated or revoked.  Performed at [facility], [address] [place], [place] [address]   MRSA PCR Screening     Status: None   Collection Time: 08/11/20  2:17 AM   Specimen: Nasopharyngeal  Result Value Ref Range Status   MRSA by PCR NEGATIVE NEGATIVE Final    Comment:        The GeneXpert MRSA Assay (FDA approved for NASAL specimens only), is one component of a comprehensive MRSA colonization surveillance program. It is not intended to diagnose MRSA infection nor to guide or monitor treatment for MRSA infections. Performed at [facility], [address], [place], [place] [address]      Radiology Studies: No results found.  Scheduled Meds: . amiodarone  200 mg Oral Daily  . Chlorhexidine Gluconate Cloth  6 each Topical Daily  . cycloSPORINE modified  200 mg Oral Daily  . dextromethorphan-guaiFENesin  1 tablet Oral BID  . DULoxetine  60 mg Oral QHS  . insulin aspart  0-5 Units Subcutaneous QHS  . insulin aspart  0-9 Units Subcutaneous TID WC  . insulin glargine  12 Units Subcutaneous Q24H  . lactulose  10 g Oral BID  . melatonin  2.5 mg Oral QHS  . metoprolol succinate  25 mg Oral BID  . mirtazapine  15 mg Oral QHS  . mycophenolate  500 mg Oral BID  . pantoprazole  40 mg Oral Daily  . rosuvastatin  5 mg Oral Daily  . sildenafil  40 mg Oral TID  . sodium bicarbonate  650 mg  Oral TID  . sodium chloride flush  10 mL Intravenous Q12H  . Warfarin - Pharmacist Dosing Inpatient   Does not apply q1600   Continuous Infusions: . sodium chloride       LOS: 6 days   Time spent: 30 minutes. More than 50% of the time was spent in counseling/coordination of care  Lorella Nimrod, MD [facility]  If 7PM-7AM, please contact night-coverage [email or web address]  08/17/2020, 2:49 PM   This record has been created using Systems analyst. Errors have been sought and corrected,but may not always be located. Such creation errors do not reflect on the standard of care.

## 2020-08-17 NOTE — Care Management Important Message (Signed)
Important Message  Patient Details  Name: Hector Mueller MRN: [id number] Date of Birth: 03-Aug-1950   Medicare Important Message Given:  Yes     [name] 08/17/2020, 11:32 AM

## 2020-08-17 NOTE — Progress Notes (Signed)
Physical Therapy Treatment Patient Details Name: Hector Mueller MRN: [id number] DOB: 1951/02/05 Today's Date: 08/17/2020    History of Present Illness Pt admitted for DM with hypersomolar hyperglycemic state with complaints of AMS. History includes liver cirrhosis s/p liver transplant, DM, BPH, and CHF.    PT Comments    OOB with ease and walks x 2 laps around unit with RW and good steady gait.  Some decreased step height and length but no LOB or buckling noted.   Stated he has been walking to/from bathroom in room with no AD reviewed safety and to call for assist if needed.  Voiced understanding.     Follow Up Recommendations  Home health PT (pt only agreeable to home-has HH established)     Equipment Recommendations  Rolling walker with 5" wheels    Recommendations for Other Services       Precautions / Restrictions Precautions Precautions: Fall Restrictions Weight Bearing Restrictions: No    Mobility  Bed Mobility Overal bed mobility: Modified Independent                  Transfers Overall transfer level: Modified independent Equipment used: Rolling walker (2 wheeled) Transfers: Sit to/from Stand Sit to Stand: Supervision;Modified independent (Device/Increase time)            Ambulation/Gait Ambulation/Gait assistance: Supervision Gait Distance (Feet): 270 Feet Assistive device: Rolling walker (2 wheeled) Gait Pattern/deviations: Step-through pattern;Trunk flexed     General Gait Details: 2 laps around nursing station   Stairs             Wheelchair Mobility    Modified Rankin (Stroke Patients Only)       Balance Overall balance assessment: Needs assistance Sitting-balance support: No upper extremity supported;Feet supported Sitting balance-Leahy Scale: Good Sitting balance - Comments: steady sitting edge of bed   Standing balance support: Bilateral upper extremity supported;During functional activity Standing balance-Leahy Scale:  Fair Standing balance comment: requires UE support on RW or handrails                            Cognition Arousal/Alertness: Awake/alert Behavior During Therapy: WFL for tasks assessed/performed Overall Cognitive Status: Within Functional Limits for tasks assessed                                        Exercises      General Comments        Pertinent Vitals/Pain Pain Assessment: No/denies pain    Home Living                      Prior Function            PT Goals (current goals can now be found in the care plan section) Progress towards PT goals: Progressing toward goals    Frequency    Min 2X/week      PT Plan Discharge plan needs to be updated    Co-evaluation              AM-PAC PT "6 Clicks" Mobility   Outcome Measure  Help needed turning from your back to your side while in a flat bed without using bedrails?: None Help needed moving from lying on your back to sitting on the side of a flat bed without using bedrails?: None Help needed moving to and from a  bed to a chair (including a wheelchair)?: A Little Help needed standing up from a chair using your arms (e.g., wheelchair or bedside chair)?: A Little Help needed to walk in hospital room?: A Little Help needed climbing 3-5 steps with a railing? : A Little 6 Click Score: 20    End of Session Equipment Utilized During Treatment: Gait belt Activity Tolerance: Patient tolerated treatment well Patient left: in chair;with call bell/phone within reach Nurse Communication: Mobility status PT Visit Diagnosis: Other abnormalities of gait and mobility (R26.89);Muscle weakness (generalized) (M62.81);Unsteadiness on feet (R26.81);Difficulty in walking, not elsewhere classified (R26.2);Pain     Time: 7001-7494 PT Time Calculation (min) (ACUTE ONLY): 15 min  Charges:  $Gait Training: 8-22 mins                    Chesley Noon, PTA 08/17/20, 3:55 PM

## 2020-08-17 NOTE — Consult Note (Signed)
[place] for Warfarin Dosing Indication: atrial fibrillation  Patient Measurements: Height: 5\' 9"  (175.3 cm) Weight: 66.7 kg (147 lb 0.8 oz) IBW/kg (Calculated) : 70.7  Labs: Recent Labs    08/15/20 0427 08/16/20 0440 08/17/20 0508  HGB 11.1*  --   --   HCT 32.6*  --   --   PLT 107*  --   --   LABPROT 22.1* 26.5* 25.0*  INR 1.9* 2.4* 2.3*  CREATININE 1.48* 1.79*  --     Estimated Creatinine Clearance: 35.2 mL/min (A) (by C-G formula based on SCr of 1.79 mg/dL (H)).   Medical History: Past Medical History:  Diagnosis Date  . Atrial fibrillation ([place])   . BPH (benign prostatic hyperplasia)   . Cancer ([place])    liver  . CHF (congestive heart failure) ([place])   . Chronic kidney disease   . Cirrhosis ([place])   . Diabetes mellitus without complication ([place])   . GERD (gastroesophageal reflux disease)   . Hepatitis C   . Hx of liver transplant ([place])   . Pulmonary HTN ([place])     Assessment: Pharmacy has been consulted for Warfarin dosing and monitoring in 70yo patient with history of Atrial Fibrillation.   Patient has been taking warfarin PTA with their home dose being 2.5mg  daily. 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  DDI: amiodarone and cyclosporine  Date INR Plan  5/31 1.1 Held  6/1 N/A 3.75 mg  6/2 1.2 4 mg  6/3 1.6 5 mg  6/4 1.9 HELD  6/5 2.4 HOLD  6/6 2.3 Holding        Goal of Therapy:  INR 2-3   Plan:  --INR is therapeutic.  - Now holding warfarin therapy for upcoming EGD per GI. Upper endoscopy can be done once INR is less than 1.5  DDI: amiodarone and cyclosporine (on both PTA) --INR daily --CBC at least every 3 days per protocol   Noralee Space, PharmD Clinical Pharmacist 08/17/2020 9:39 AM

## 2020-08-18 ENCOUNTER — Inpatient Hospital Stay (HOSPITAL_COMMUNITY): Payer: Medicare Other

## 2020-08-18 DIAGNOSIS — Z01810 Encounter for preprocedural cardiovascular examination: Secondary | ICD-10-CM

## 2020-08-18 LAB — CBC
HCT: 31.4 % — ABNORMAL LOW (ref 39.0–52.0)
Hemoglobin: 11.1 g/dL — ABNORMAL LOW (ref 13.0–17.0)
MCH: 32.3 pg (ref 26.0–34.0)
MCHC: 35.4 g/dL (ref 30.0–36.0)
MCV: 91.3 fL (ref 80.0–100.0)
Platelets: 135 10*3/uL — ABNORMAL LOW (ref 150–400)
RBC: 3.44 MIL/uL — ABNORMAL LOW (ref 4.22–5.81)
RDW: 14.6 % (ref 11.5–15.5)
WBC: 3.7 10*3/uL — ABNORMAL LOW (ref 4.0–10.5)
nRBC: 0 % (ref 0.0–0.2)

## 2020-08-18 LAB — PROTIME-INR
INR: 1.8 — ABNORMAL HIGH (ref 0.8–1.2)
Prothrombin Time: 21.2 seconds — ABNORMAL HIGH (ref 11.4–15.2)

## 2020-08-18 LAB — GLUCOSE, CAPILLARY
Glucose-Capillary: 173 mg/dL — ABNORMAL HIGH (ref 70–99)
Glucose-Capillary: 211 mg/dL — ABNORMAL HIGH (ref 70–99)
Glucose-Capillary: 157 mg/dL — ABNORMAL HIGH (ref 70–99)

## 2020-08-18 LAB — NM MYOCAR MULTI W/SPECT W/WALL MOTION / EF
Estimated workload: 1 METS
Exercise duration (min): 0 min
Exercise duration (sec): 0 s
LV dias vol: 51 mL (ref 62–150)
LV sys vol: 19 mL
MPHR: 148 {beats}/min
Peak HR: 82 {beats}/min
Percent HR: 55 %
Rest HR: 61 {beats}/min
SDS: 0
SRS: 6
SSS: 2
TID: 0.78

## 2020-08-18 MED ORDER — TECHNETIUM TC 99M TETROFOSMIN IV KIT
10.0000 | PACK | Freq: Once | INTRAVENOUS | Status: AC | PRN
  Administered 2020-08-18: 10 via INTRAVENOUS

## 2020-08-18 MED ORDER — TECHNETIUM TC 99M TETROFOSMIN IV KIT
32.8800 | PACK | Freq: Once | INTRAVENOUS | Status: AC | PRN
  Administered 2020-08-18: 32.88 via INTRAVENOUS

## 2020-08-18 MED ORDER — REGADENOSON 0.4 MG/5ML IV SOLN
0.4000 mg | Freq: Once | INTRAVENOUS | Status: AC
  Administered 2020-08-18: 0.4 mg via INTRAVENOUS

## 2020-08-18 NOTE — Progress Notes (Signed)
PT Cancellation Note  Patient Details Name: Winthrop Shannahan MRN: [id number] DOB: 1950-12-03   Cancelled Treatment:    Reason Eval/Treat Not Completed: Patient at procedure or test/unavailable.  Chart reviewed.  Pt currently not in room (per chart plan for stress test today).  Will re-attempt PT session at a later date/time as medically appropriate.  Leitha Bleak, PT 08/18/20, 11:18 AM

## 2020-08-18 NOTE — Consult Note (Addendum)
[place] for Warfarin Dosing Indication: atrial fibrillation  Patient Measurements: Height: 5\' 9"  (175.3 cm) Weight: 69.6 kg (153 lb 7 oz) IBW/kg (Calculated) : 70.7  Labs: Recent Labs    08/16/20 0440 08/17/20 0508 08/18/20 0440  HGB  --   --  11.1*  HCT  --   --  31.4*  PLT  --   --  135*  LABPROT 26.5* 25.0* 21.2*  INR 2.4* 2.3* 1.8*  CREATININE 1.79*  --   --     Estimated Creatinine Clearance: 36.7 mL/min (A) (by C-G formula based on SCr of 1.79 mg/dL (H)).   Medical History: Past Medical History:  Diagnosis Date  . Atrial fibrillation ([place])   . BPH (benign prostatic hyperplasia)   . Cancer ([place])    liver  . CHF (congestive heart failure) ([place])   . Chronic kidney disease   . Cirrhosis ([place])   . Diabetes mellitus without complication ([place])   . GERD (gastroesophageal reflux disease)   . Hepatitis C   . Hx of liver transplant ([place])   . Pulmonary HTN ([place])     Assessment: Pharmacy has been consulted for Warfarin dosing and monitoring in 70yo patient with history of Atrial Fibrillation.   Patient has been taking warfarin PTA with their home dose being 2.5mg  daily. 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  DDI: amiodarone and cyclosporine  Date INR Plan  5/31 1.1 HELD  6/1 N/A 3.75 mg  6/2 1.2 4 mg  6/3 1.6 5 mg  6/4 1.9 HELD  6/5 2.4 HELD  6/6 2.3 HELD  6/7 1.8 holding    Goal of Therapy:  INR 2-3   Plan:  --INR is slightly sub-therapeutic.  - Now holding warfarin therapy for upcoming EGD per GI. Upper endoscopy can be done once INR is less than 1.5 (no bridge per hospitalist) DDI: amiodarone and cyclosporine (on both PTA) --INR daily --CBC at least every 3 days per protocol  Lu Duffel, PharmD, BCPS Clinical Pharmacist 08/18/2020 7:45 AM

## 2020-08-18 NOTE — Progress Notes (Signed)
PROGRESS NOTE    Mueller Mueller  BPZ:[id number] DOB: Feb 04, 1951 DOA: 08/10/2020 PCP: Hector Hector, MD   Brief Narrative: Taken from H&P and prior notes. Mueller Mueller is a 70 y.o. 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 Found to have CBG of 859, admitted for hyperosmolar hyperglycemia, initially managed with insulin infusion. Due to worsening agitation he was started on Precedex infusion, later transitioned off with improvement in mental status and agitation. Significantly improved-PT is recommending SNF placement. Esophageal gram was obtained due to his complaint of food sticking in chest and causing pain, it shows narrowing at distal esophagus, recommending endoscopy.  GI was consulted-   Holding Coumadin for now for possible EGD earlier next week. GI is recommending doing EGD during weekdays for better anesthesia support.  Patient has prior complicated history of intubations and a vocal cord paralysis.  Cardiology was consulted for preanesthesia cardiac clearance as requested by GI-going for Myoview today and hopefully EGD tomorrow.  INR 1.8 today  Subjective: Patient has no new complaint today.  Waiting for his Myoview.  Assessment & Plan:   Principal Problem:   Type 2 diabetes mellitus with hyperosmolar hyperglycemic state (HHS) ([place]) Active Problems:   Atrial fibrillation (HCC)   HFrEF (heart failure with reduced ejection fraction) (HCC)   Hx of liver transplant ([place])   CKD (chronic kidney disease), stage III (HCC)  Acute metabolic encephalopathy.  Resolved  Most likely related to HHS, became quite agitated  and somnolent.  PCO2 normal on ABG.  TSH mildly elevated at 4.5, ammonia levels within normal limit, UA with some proteinuria but no pyuria or bacteriuria, UDS negative. Precedex infusion has been discontinued appears at baseline now. -Continue to monitor  Dysphagia.  Patient's symptoms were concerning for dysphagia, esophageal gram was obtained which shows distal esophageal narrowing and inability to pass pill and they were recommending endoscopic evaluation.  GI was consulted  GI wants to keep holding Coumadin and plan for EGD earlier during the week for better anesthesia support due to his complicated past medical history. INR of 1.8 today, last dose of Coumadin was on 08/14/2020. Cardiology was consulted for preprocedural cardiac clearance as requested by GI and patient will be going for Myoview today and EGD tomorrow.  Paroxysmal atrial fibrillation.  Currently in sinus rhythm.  History of DCCV on 07/13/2020. -Continue home metoprolol -Holding Coumadin for EGD-INR was subtherapeutic not sure whether patient was taking it, per patient he was missing doses as keep forgetting to take his medications. -Will resume Coumadin after EGD once cleared from GI  Uncontrolled type 2 diabetes mellitus with HHS.  CBG currently within goal. -Continue with SSI -Continue Lantus 12 units daily  CKD stage IIIb.  Creatinine seems improved from his baseline with IV fluid, currently stable. IV fluids were discontinued. -Monitor renal function -Avoid nephrotoxins  History of liver cirrhosis secondary to hep C s/p liver transplant in 2018. Ammonia levels within normal limit and LFTs stable. -Continue home meds.  Anemia of chronic disease/thrombocytopenia.  Seems stable. -Continue to monitor  Chronic HFrEF with right heart failure/pulmonary hypertension.  Appears well compensated. -Continue home meds -Daily weight and BMP  Objective: Vitals:   08/17/20 2100 08/18/20 0448 08/18/20 2423  08/18/20 0804  BP:    137/72 (!) 123/56  Pulse: (!) 58  61 61  Resp:   16 20  Temp:   97.8 F (36.6 C) 97.8 F (36.6 C)  TempSrc:   Oral Oral  SpO2: 99%  97% 98%  Weight:  69.6 kg    Height:        Intake/Output Summary (Last 24 hours) at 08/18/2020 1140 Last data filed at 08/17/2020 1900 Gross per 24 hour  Intake 240 ml  Output --  Net 240 ml   Filed Weights   08/16/20 0500 08/17/20 0500 08/18/20 0448  Weight: 72.4 kg 66.7 kg 69.6 kg    Examination:  General.  Frail gentleman, in no acute distress. Pulmonary.  Lungs clear bilaterally, normal respiratory effort. CV.  Regular rate and rhythm, no JVD, rub or murmur. Abdomen.  Soft, nontender, nondistended, BS positive. CNS.  Alert and oriented x3.  No focal neurologic deficit. Extremities.  No edema, no cyanosis, pulses intact and symmetrical. Psychiatry.  Judgment and insight appears normal.  DVT prophylaxis: SCDs, Coumadin is  on hold Code Status: Full Family Communication: Discussed with patient Disposition Plan:  Status is: Inpatient  Remains inpatient appropriate because:Inpatient level of care appropriate due to severity of illness   Dispo: The patient is from: Home              Anticipated d/c is to: Home              Patient currently is not medically stable to d/c.   Difficult to place patient No              Level of care: Med-Surg  All the records are reviewed and case discussed with Care Management/Social Worker. Management plans discussed with the patient, nursing and they are in agreement.  Consultants:   PCCM  GI  Cardiology  Procedures:  Antimicrobials:   Data Reviewed: I have personally reviewed following labs and imaging studies  CBC: Recent Labs  Lab 08/12/20 0336 08/15/20 0427 08/18/20 0440  WBC 4.2 5.3 3.7*  HGB 9.8* 11.1* 11.1*  HCT 28.9* 32.6* 31.4*  MCV 92.3 90.8 91.3  PLT 97* 107* 081*   Basic Metabolic Panel: Recent Labs  Lab 08/11/20 1141 08/12/20 0336 08/13/20 0322 08/14/20 0531  08/15/20 0427 08/16/20 0440  NA  --  137 137 138 136 137  K  --  3.6 4.1 3.6 3.8 4.4  CL  --  106 108 108 106 106  CO2  --  25 23 22  21* 23  GLUCOSE  --  110* 187* 110* 144* 148*  BUN  --  20 15 13 17 23   CREATININE  --  1.43* 1.31* 1.43* 1.48* 1.79*  CALCIUM  --  8.4* 8.7* 8.7* 9.0 8.9  MG 2.2  --   --   --   --   --    GFR: Estimated Creatinine Clearance: 36.7 mL/min (A) (by C-G formula based on SCr of 1.79 mg/dL (H)). Liver Function Tests: No results for input(s): AST, ALT, ALKPHOS, BILITOT, PROT, ALBUMIN in the last 168 hours. No results for input(s): LIPASE, AMYLASE in the last 168 hours. Recent Labs  Lab 08/11/20 1141  AMMONIA 17   Coagulation Profile: Recent Labs  Lab 08/14/20 0531 08/15/20 0427 08/16/20 0440 08/17/20 0508 08/18/20 0440  INR 1.6* 1.9* 2.4* 2.3* 1.8*   Cardiac Enzymes: No results for input(s): CKTOTAL, CKMB, CKMBINDEX, TROPONINI in the last 168 hours. BNP (last 3 results) No results  for input(s): PROBNP in the last 8760 hours. HbA1C: No results for input(s): HGBA1C in the last 72 hours. CBG: Recent Labs  Lab 08/17/20 0731 08/17/20 1134 08/17/20 1659 08/17/20 2055 08/18/20 0917  GLUCAP 199* 283* 232* 193* 173*   Lipid Profile: No results for input(s): CHOL, HDL, LDLCALC, TRIG, CHOLHDL, LDLDIRECT in the last 72 hours. Thyroid Function Tests: No results for input(s): TSH, T4TOTAL, FREET4, T3FREE, THYROIDAB in the last 72 hours. Anemia Panel: No results for input(s): VITAMINB12, FOLATE, FERRITIN, TIBC, IRON, RETICCTPCT in the last 72 hours. Sepsis Labs: No results for input(s): PROCALCITON, LATICACIDVEN in the last 168 hours.  Recent Results (from the past 240 hour(s))  Resp Panel by RT-PCR (Flu A&B, Covid) Nasopharyngeal Swab     Status: None   Collection Time: 08/10/20 12:08 AM   Specimen: Nasopharyngeal Swab; Nasopharyngeal(NP) swabs in vial transport medium  Result Value Ref Range Status   SARS Coronavirus 2 by RT PCR NEGATIVE  NEGATIVE Final    Comment: (NOTE) SARS-CoV-2 target nucleic acids are NOT DETECTED.  The SARS-CoV-2 RNA is generally detectable in upper respiratory specimens during the acute phase of infection. The lowest concentration of SARS-CoV-2 viral copies this assay can detect is 138 copies/mL. A negative result does not preclude SARS-Cov-2 infection and should not be used as the sole basis for treatment or other patient management decisions. A negative result may occur with  improper specimen collection/handling, submission of specimen other than nasopharyngeal swab, presence of viral mutation(s) within the areas targeted by this assay, and inadequate number of viral copies(<138 copies/mL). A negative result must be combined with clinical observations, patient history, and epidemiological information. The expected result is Negative.  Fact Sheet for Patients:  EntrepreneurPulse.com.au  Fact Sheet for Healthcare Providers:  IncredibleEmployment.be  This test is no t yet approved or cleared by the [place] FDA and  has been authorized for detection and/or diagnosis of SARS-CoV-2 by FDA under an Emergency Use Authorization (EUA). This EUA will remain  in effect (meaning this test can be used) for the duration of the COVID-19 declaration under Section 564(b)(1) of the Act, 21 U.S.C.section 360bbb-3(b)(1), unless the authorization is terminated  or revoked sooner.       Influenza A by PCR NEGATIVE NEGATIVE Final   Influenza B by PCR NEGATIVE NEGATIVE Final    Comment: (NOTE) The Xpert Xpress SARS-CoV-2/FLU/RSV plus assay is intended as an aid in the diagnosis of influenza from Nasopharyngeal swab specimens and should not be used as a sole basis for treatment. Nasal washings and aspirates are unacceptable for Xpert Xpress SARS-CoV-2/FLU/RSV testing.  Fact Sheet for Patients: EntrepreneurPulse.com.au  Fact Sheet for Healthcare  Providers: IncredibleEmployment.be  This test is not yet approved or cleared by the [place] FDA and has been authorized for detection and/or diagnosis of SARS-CoV-2 by FDA under an Emergency Use Authorization (EUA). This EUA will remain in effect (meaning this test can be used) for the duration of the COVID-19 declaration under Section 564(b)(1) of the Act, 21 U.S.C. section 360bbb-3(b)(1), unless the authorization is terminated or revoked.  Performed at [facility], [address] [place], [place] [address]   MRSA PCR Screening     Status: None   Collection Time: 08/11/20  2:17 AM   Specimen: Nasopharyngeal  Result Value Ref Range Status   MRSA by PCR NEGATIVE NEGATIVE Final    Comment:        The GeneXpert MRSA Assay (FDA approved for NASAL specimens only), is one component  of a comprehensive MRSA colonization surveillance program. It is not intended to diagnose MRSA infection nor to guide or monitor treatment for MRSA infections. Performed at [facility], [address] [place], [place] [address]      Radiology Studies: No results found.  Scheduled Meds: . amiodarone  200 mg Oral Daily  . Chlorhexidine Gluconate Cloth  6 each Topical Daily  . cycloSPORINE modified  200 mg Oral Daily  . dextromethorphan-guaiFENesin  1 tablet Oral BID  . DULoxetine  60 mg Oral QHS  . insulin aspart  0-5 Units Subcutaneous QHS  . insulin aspart  0-9 Units Subcutaneous TID WC  . insulin glargine  12 Units Subcutaneous Q24H  . lactulose  10 g Oral BID  . melatonin  2.5 mg Oral QHS  . metoprolol succinate  25 mg Oral BID  . mirtazapine  15 mg Oral QHS  . mycophenolate  500 mg Oral BID  . pantoprazole  40 mg Oral Daily  . rosuvastatin  5 mg Oral Daily  . sildenafil  40 mg Oral TID  . sodium bicarbonate  650 mg Oral TID  . sodium chloride flush  10 mL Intravenous Q12H  . Warfarin - Pharmacist Dosing Inpatient   Does not apply q1600    Continuous Infusions: . sodium chloride       LOS: 7 days   Time spent: 25 minutes. More than 50% of the time was spent in counseling/coordination of care  Lorella Nimrod, MD [facility]  If 7PM-7AM, please contact night-coverage [email or web address]  08/18/2020, 11:40 AM   This record has been created using Systems analyst. Errors have been sought and corrected,but may not always be located. Such creation errors do not reflect on the standard of care.

## 2020-08-18 NOTE — Progress Notes (Signed)
See consult note.  The patient underwent a Lexiscan Myoview today which showed no evidence of ischemia with normal ejection fraction. The patient can proceed with endoscopic procedure at an overall low risk.  Warfarin should be resumed after the procedure.  The patient will need to follow-up in the anticoagulation clinic for  INR checks post discharge.

## 2020-08-18 NOTE — Progress Notes (Signed)
Vonda Antigua, MD [address] [place], [place], [place], [address] [address], [place], [place], [place], [address] Phone: [phone]  Fax: [phone]   Subjective: Patient resting in bed comfortably.  Denies any abdominal pain.  No nausea or vomiting.   Objective: Exam: Vital signs in last 24 hours: Vitals:   08/17/20 2100 08/18/20 0448 08/18/20 0452 08/18/20 0804  BP:   137/72 (!) 123/56  Pulse: (!) 58  61 61  Resp:   16 20  Temp:   97.8 F (36.6 C) 97.8 F (36.6 C)  TempSrc:   Oral Oral  SpO2: 99%  97% 98%  Weight:  69.6 kg    Height:       Weight change: 2.9 kg  Intake/Output Summary (Last 24 hours) at 08/18/2020 4665 Last data filed at 08/17/2020 1900 Gross per 24 hour  Intake 720 ml  Output --  Net 720 ml    General: No acute distress, AAO x3 Abd: Soft, NT/ND, No HSM Skin: Warm, no rashes Neck: Supple, Trachea midline   Lab Results: Lab Results  Component Value Date   WBC 3.7 (L) 08/18/2020   HGB 11.1 (L) 08/18/2020   HCT 31.4 (L) 08/18/2020   MCV 91.3 08/18/2020   PLT 135 (L) 08/18/2020   Micro Results: Recent Results (from the past 240 hour(s))  Resp Panel by RT-PCR (Flu A&B, Covid) Nasopharyngeal Swab     Status: None   Collection Time: 08/10/20 12:08 AM   Specimen: Nasopharyngeal Swab; Nasopharyngeal(NP) swabs in vial transport medium  Result Value Ref Range Status   SARS Coronavirus 2 by RT PCR NEGATIVE NEGATIVE Final    Comment: (NOTE) SARS-CoV-2 target nucleic acids are NOT DETECTED.  The SARS-CoV-2 RNA is generally detectable in upper respiratory specimens during the acute phase of infection. The lowest concentration of SARS-CoV-2 viral copies this assay can detect is 138 copies/mL. A negative result does not preclude SARS-Cov-2 infection and should not be used as the sole basis for treatment or other patient management decisions. A negative result may occur with  improper specimen collection/handling, submission of  specimen other than nasopharyngeal swab, presence of viral mutation(s) within the areas targeted by this assay, and inadequate number of viral copies(<138 copies/mL). A negative result must be combined with clinical observations, patient history, and epidemiological information. The expected result is Negative.  Fact Sheet for Patients:  EntrepreneurPulse.com.au  Fact Sheet for Healthcare Providers:  IncredibleEmployment.be  This test is no t yet approved or cleared by the [place] FDA and  has been authorized for detection and/or diagnosis of SARS-CoV-2 by FDA under an Emergency Use Authorization (EUA). This EUA will remain  in effect (meaning this test can be used) for the duration of the COVID-19 declaration under Section 564(b)(1) of the Act, 21 U.S.C.section 360bbb-3(b)(1), unless the authorization is terminated  or revoked sooner.       Influenza A by PCR NEGATIVE NEGATIVE Final   Influenza B by PCR NEGATIVE NEGATIVE Final    Comment: (NOTE) The Xpert Xpress SARS-CoV-2/FLU/RSV plus assay is intended as an aid in the diagnosis of influenza from Nasopharyngeal swab specimens and should not be used as a sole basis for treatment. Nasal washings and aspirates are unacceptable for Xpert Xpress SARS-CoV-2/FLU/RSV testing.  Fact Sheet for Patients: EntrepreneurPulse.com.au  Fact Sheet for Healthcare Providers: IncredibleEmployment.be  This test is not yet approved or cleared by the [place] FDA and has been authorized for detection and/or diagnosis of SARS-CoV-2 by FDA under an  Emergency Use Authorization (EUA). This EUA will remain in effect (meaning this test can be used) for the duration of the COVID-19 declaration under Section 564(b)(1) of the Act, 21 U.S.C. section 360bbb-3(b)(1), unless the authorization is terminated or revoked.  Performed at [facility], [address] [place], [place] [address]   MRSA PCR Screening     Status: None   Collection Time: 08/11/20  2:17 AM   Specimen: Nasopharyngeal  Result Value Ref Range Status   MRSA by PCR NEGATIVE NEGATIVE Final    Comment:        The GeneXpert MRSA Assay (FDA approved for NASAL specimens only), is one component of a comprehensive MRSA colonization surveillance program. It is not intended to diagnose MRSA infection nor to guide or monitor treatment for MRSA infections. Performed at [facility], [address], [place], [place] [address]    Studies/Results: No results found. Medications:  Scheduled Meds: . amiodarone  200 mg Oral Daily  . Chlorhexidine Gluconate Cloth  6 each Topical Daily  . cycloSPORINE modified  200 mg Oral Daily  . dextromethorphan-guaiFENesin  1 tablet Oral BID  . DULoxetine  60 mg Oral QHS  . insulin aspart  0-5 Units Subcutaneous QHS  . insulin aspart  0-9 Units Subcutaneous TID WC  . insulin glargine  12 Units Subcutaneous Q24H  . lactulose  10 g Oral BID  . melatonin  2.5 mg Oral QHS  . metoprolol succinate  25 mg Oral BID  . mirtazapine  15 mg Oral QHS  . mycophenolate  500 mg Oral BID  . pantoprazole  40 mg Oral Daily  . rosuvastatin  5 mg Oral Daily  . sildenafil  40 mg Oral TID  . sodium bicarbonate  650 mg Oral TID  . sodium chloride flush  10 mL Intravenous Q12H  . Warfarin - Pharmacist Dosing Inpatient   Does not apply q1600   Continuous Infusions: . sodium chloride     PRN Meds:.acetaminophen, dextrose, haloperidol lactate, LORazepam, metoprolol tartrate, polyvinyl alcohol   Assessment: Principal Problem:   Type 2 diabetes mellitus with hyperosmolar hyperglycemic state (HHS) ([place]) Active Problems:   Atrial fibrillation (HCC)   HFrEF (heart failure with reduced ejection fraction) (HCC)   Hx of liver transplant ([place])   CKD (chronic kidney disease), stage III ([place])    Plan: Patient's INR is 1.8 today and he is awaiting stress  test today  After cardiology evaluation and stress test and clearance, can proceed with upper endoscopy likely tomorrow if INR is less than 1.5 tomorrow     LOS: 7 days   Vonda Antigua, MD 08/18/2020, 8:22 AM

## 2020-08-19 ENCOUNTER — Encounter
Admission: EM | Disposition: A | Discharge status: Home Health | Payer: Self-pay | Source: Home / Self Care | Attending: Internal Medicine

## 2020-08-19 ENCOUNTER — Inpatient Hospital Stay: Payer: Medicare Other | Admitting: Anesthesiology

## 2020-08-19 DIAGNOSIS — R1319 Other dysphagia: Secondary | ICD-10-CM

## 2020-08-19 DIAGNOSIS — K449 Diaphragmatic hernia without obstruction or gangrene: Secondary | ICD-10-CM

## 2020-08-19 DIAGNOSIS — K209 Esophagitis, unspecified without bleeding: Secondary | ICD-10-CM

## 2020-08-19 DIAGNOSIS — R791 Abnormal coagulation profile: Secondary | ICD-10-CM

## 2020-08-19 DIAGNOSIS — K319 Disease of stomach and duodenum, unspecified: Secondary | ICD-10-CM

## 2020-08-19 DIAGNOSIS — I480 Paroxysmal atrial fibrillation: Secondary | ICD-10-CM

## 2020-08-19 DIAGNOSIS — R1310 Dysphagia, unspecified: Secondary | ICD-10-CM

## 2020-08-19 HISTORY — PX: ESOPHAGOGASTRODUODENOSCOPY: SHX5428

## 2020-08-19 LAB — GLUCOSE, CAPILLARY
Glucose-Capillary: 146 mg/dL — ABNORMAL HIGH (ref 70–99)
Glucose-Capillary: 144 mg/dL — ABNORMAL HIGH (ref 70–99)
Glucose-Capillary: 170 mg/dL — ABNORMAL HIGH (ref 70–99)

## 2020-08-19 LAB — PROTIME-INR
INR: 1.9 — ABNORMAL HIGH (ref 0.8–1.2)
INR: 1.8 — ABNORMAL HIGH (ref 0.8–1.2)
INR: 1.6 — ABNORMAL HIGH (ref 0.8–1.2)
Prothrombin Time: 21.9 seconds — ABNORMAL HIGH (ref 11.4–15.2)
Prothrombin Time: 20.8 seconds — ABNORMAL HIGH (ref 11.4–15.2)
Prothrombin Time: 19.2 seconds — ABNORMAL HIGH (ref 11.4–15.2)

## 2020-08-19 LAB — KOH PREP: KOH Prep: NONE SEEN

## 2020-08-19 SURGERY — EGD (ESOPHAGOGASTRODUODENOSCOPY)
Anesthesia: General

## 2020-08-19 MED ORDER — SODIUM CHLORIDE 0.9 % IV SOLN: INTRAVENOUS | Status: DC

## 2020-08-19 MED ORDER — PANTOPRAZOLE SODIUM 40 MG PO TBEC
40.0000 mg | DELAYED_RELEASE_TABLET | Freq: Two times a day (BID) | ORAL | Status: DC
  Administered 2020-08-19 – 2020-08-20 (×2): 40 mg via ORAL
  Filled 2020-08-19 (×2): qty 1

## 2020-08-19 MED ORDER — PROPOFOL 500 MG/50ML IV EMUL
INTRAVENOUS | Status: DC | PRN
  Administered 2020-08-19: 130 ug/kg/min via INTRAVENOUS

## 2020-08-19 MED ORDER — PROPOFOL 10 MG/ML IV BOLUS
INTRAVENOUS | Status: DC | PRN
  Administered 2020-08-19: 70 mg via INTRAVENOUS

## 2020-08-19 MED ORDER — LIDOCAINE HCL (CARDIAC) PF 100 MG/5ML IV SOSY
PREFILLED_SYRINGE | INTRAVENOUS | Status: DC | PRN
  Administered 2020-08-19: 50 mg via INTRAVENOUS

## 2020-08-19 MED ORDER — PROPOFOL 10 MG/ML IV BOLUS
INTRAVENOUS | Status: AC
  Filled 2020-08-19: qty 20

## 2020-08-19 MED ORDER — PREGABALIN 75 MG PO CAPS
75.0000 mg | ORAL_CAPSULE | Freq: Two times a day (BID) | ORAL | Status: DC
  Administered 2020-08-19 – 2020-08-20 (×3): 75 mg via ORAL
  Filled 2020-08-19 (×3): qty 1

## 2020-08-19 MED ORDER — GLYCOPYRROLATE 0.2 MG/ML IJ SOLN
INTRAMUSCULAR | Status: DC | PRN
  Administered 2020-08-19: .2 mg via INTRAVENOUS

## 2020-08-19 MED ORDER — SODIUM CHLORIDE 0.9% IV SOLUTION: Freq: Once | INTRAVENOUS | Status: AC

## 2020-08-19 NOTE — Progress Notes (Signed)
Progress Note  Patient Name: Hector Mueller Date of Encounter: 08/19/2020  Primary Cardiologist:Dr. End  Subjective   Still awaiting EGD --INR needs to be below 1.5. Possible EGD later this morning per review of GI note.  No symptoms concerning for angina. No shortness of breath at rest. Able to lay flat in the bed.  Discussed follow-up in the office and INR checks with the Coumadin clinic.  Inpatient Medications    Scheduled Meds: . amiodarone  200 mg Oral Daily  . Chlorhexidine Gluconate Cloth  6 each Topical Daily  . cycloSPORINE modified  200 mg Oral Daily  . dextromethorphan-guaiFENesin  1 tablet Oral BID  . DULoxetine  60 mg Oral QHS  . insulin aspart  0-5 Units Subcutaneous QHS  . insulin aspart  0-9 Units Subcutaneous TID WC  . insulin glargine  12 Units Subcutaneous Q24H  . lactulose  10 g Oral BID  . melatonin  2.5 mg Oral QHS  . metoprolol succinate  25 mg Oral BID  . mirtazapine  15 mg Oral QHS  . mycophenolate  500 mg Oral BID  . pantoprazole  40 mg Oral Daily  . pregabalin  75 mg Oral BID  . rosuvastatin  5 mg Oral Daily  . sildenafil  40 mg Oral TID  . sodium bicarbonate  650 mg Oral TID  . sodium chloride flush  10 mL Intravenous Q12H  . Warfarin - Pharmacist Dosing Inpatient   Does not apply q1600   Continuous Infusions: . sodium chloride     PRN Meds: acetaminophen, dextrose, haloperidol lactate, LORazepam, metoprolol tartrate, polyvinyl alcohol   Vital Signs    Vitals:   08/19/20 0915 08/19/20 1035 08/19/20 1050 08/19/20 1114  BP: 129/68 136/66 127/65 125/62  Pulse: (!) 57 (!) 59 60 (!) 56  Resp: 18 18 18 20   Temp: 97.8 F (36.6 C) 98 F (36.7 C) 97.9 F (36.6 C) 98.6 F (37 C)  TempSrc: Oral Oral Oral Oral  SpO2: 96% 96% 96% 95%  Weight:      Height:        Intake/Output Summary (Last 24 hours) at 08/19/2020 1137 Last data filed at 08/18/2020 1700 Gross per 24 hour  Intake 240 ml  Output --  Net 240 ml   Last 3 Weights  08/19/2020 08/18/2020 08/17/2020  Weight (lbs) 158 lb 1.1 oz 153 lb 7 oz 147 lb 0.8 oz  Weight (kg) 71.7 kg 69.6 kg 66.7 kg      Telemetry    Not on telemetry- Personally Reviewed  ECG    No new tracings- Personally Reviewed  Physical Exam   GEN: No acute distress.   Neck: No JVD Cardiac: RRR, no murmurs, rubs, or gallops.  Respiratory: Clear to auscultation bilaterally. GI: Soft, nontender, non-distended  MS: No edema; No deformity. Neuro:  Nonfocal  Psych: Normal affect   Labs    High Sensitivity Troponin:  No results for input(s): TROPONINIHS in the last 720 hours.    Chemistry Recent Labs  Lab 08/14/20 0531 08/15/20 0427 08/16/20 0440  NA 138 136 137  K 3.6 3.8 4.4  CL 108 106 106  CO2 22 21* 23  GLUCOSE 110* 144* 148*  BUN 13 17 23   CREATININE 1.43* 1.48* 1.79*  CALCIUM 8.7* 9.0 8.9  GFRNONAA 52* 50* 40*  ANIONGAP 8 9 8      Hematology Recent Labs  Lab 08/15/20 0427 08/18/20 0440  WBC 5.3 3.7*  RBC 3.59* 3.44*  HGB 11.1* 11.1*  HCT 32.6* 31.4*  MCV 90.8 91.3  MCH 30.9 32.3  MCHC 34.0 35.4  RDW 14.1 14.6  PLT 107* 135*    BNPNo results for input(s): BNP, PROBNP in the last 168 hours.   DDimer No results for input(s): DDIMER in the last 168 hours.   Radiology    NM Myocar Multi W/Spect W/Wall Motion / EF  Result Date: 08/18/2020  There was no ST segment deviation noted during stress.  No T wave inversion was noted during stress.  The study is normal.  This is a low risk study.  The left ventricular ejection fraction is normal (55-65%).  CT attenuation images showed moderate aortic and coronary calcifications.     Cardiac Studies   Echo 07/13/20 1. Left ventricular ejection fraction, by estimation, is 40 to 45%. The  left ventricle has mild to moderately decreased function. The left  ventricle demonstrates global hypokinesis. Left ventricular diastolic  parameters are consistent with Grade I  diastolic dysfunction (impaired relaxation).   2. Right ventricular systolic function is normal. The right ventricular  size is normal.  3. The mitral valve is grossly normal. No evidence of mitral valve  regurgitation.  4. The aortic valve was not well visualized. Aortic valve regurgitation  is not visualized.  5. Pulmonic valve regurgitation not assessed.   MPI 08/19/20  There was no ST segment deviation noted during stress.  No T wave inversion was noted during stress.  The study is normal.  This is a low risk study.  The left ventricular ejection fraction is normal (55-65%).  CT attenuation images showed moderate aortic and coronary calcifications.   Patient Profile     70 y.o. male with a hx of sick sinus syndrome s/p Micra pacemaker (09/2016), HFrEF, PAF s/p recent DCCV, pulmonary hypertension, cirrhosis s/p liver transplant, DM2, hyperlipidemia, CKD, and with radiation admission for acute hypoxic respiratory failure secondary to altered mental status in the setting of HHS and seen by cardiology for atrial fibrillation with rapid ventricular rate and associated shock requiring emergent cardioversion 07/13/2020, who is being seen today before EGD.  Assessment & Plan    Preoperative cardiovascular evaluation  -- Refer to consult note.  No chest pain.  MPI performed this admission as below and ruled low risk.  Maintaining NSR.  No further cardiac testing indicated before EGD at this time.  Holding warfarin at this time until INR below 1.5 with restart after EGD.  Atrial fibrillation with RVR -- Maintaining NSR  --S/p cardioversion performed 07/13/2020. --Continue rate control with Toprol 25 mg twice daily.   --Continue amiodarone 200 mg daily.   -- Holding warfarin until INR below 1.5 in the setting of upcoming EGD with restart of anticoagulation before discharge.  -- Message sent to Coumadin clinic and/or scheduling team at heart care to ensure he receives office follow-up/INR checks after  discharge.  Cardiomyopathy --  No symptoms concerning for angina. Previous echo with reduced EF 40 to 45%.  MPI performed and without evidence of ischemia, normal EF. Euvolemic and well compensated on exam.  Cardiomyopathy thought 2/2 tachycardia. Continue current medications.  Escalation of GDMT limited by renal function.  No ACE/ARB/Arni/MRA/SGLT2 inhibitor in the setting of CKD.  CKD stage IV -- Daily BMET.  Caution with nephrotoxins.  DM2 --SSI, per IM.    For questions or updates, please contact [place] Please consult [email or web address] for contact info under        Signed, Arvil Chaco, PA-C  08/19/2020, 11:37 AM

## 2020-08-19 NOTE — Op Note (Signed)
[facility] Patient Name: Hector Mueller Procedure Date: 08/19/2020 8:52 AM MRN: [id number] Account #: [id number] Date of Birth: 1950/06/06 Admit Type: Inpatient Age: 70 Room: [facility] ENDO ROOM 3 Gender: Male Note Status: Finalized Procedure:             Upper GI endoscopy Indications:           Dysphagia Providers:             Levon Penning B. Bonna Gains MD, MD Referring MD:          Ramonita Lab, MD (Referring MD) Medicines:             Monitored Anesthesia Care Complications:         No immediate complications. Procedure:             Pre-Anesthesia Assessment:                        - The risks and benefits of the procedure and the                         sedation options and risks were discussed with the                         patient. All questions were answered and informed                         consent was obtained.                        - Patient identification and proposed procedure were                         verified prior to the procedure.                        - ASA Grade Assessment: III - A patient with severe                         systemic disease.                        After obtaining informed consent, the endoscope was                         passed under direct vision. Throughout the procedure,                         the patient's blood pressure, pulse, and oxygen                         saturations were monitored continuously. The Endoscope                         was introduced through the mouth, and advanced to the                         second part of duodenum. The upper GI endoscopy was                         accomplished with ease. The  patient tolerated the                         procedure well. Findings:      LA Grade D (one or more mucosal breaks involving at least 75% of       esophageal circumference) esophagitis with no bleeding was found in the       distal esophagus.      White nummular lesions were noted in the  entire esophagus. Brushings for       KOH prep were obtained.      Patchy mildly erythematous mucosa without bleeding was found in the       gastric antrum.      An area of indentation seen in the stomach and may represent a previous       PEG tube site.      A small hiatal hernia was present.      The exam of the stomach was otherwise normal.      Patchy mildly erythematous mucosa without active bleeding and with no       stigmata of bleeding was found in the duodenal bulb.      The exam of the duodenum was otherwise normal. Impression:            - LA Grade D esophagitis with no bleeding.                        - White nummular lesions in esophageal mucosa.                         Brushings performed.                        - Erythematous mucosa in the antrum.                        - An area of indentation seen in the stomach and may                         represent a previous PEG tube site.                        - Small hiatal hernia.                        - Erythematous duodenopathy. Recommendation:        - Severe esophagitis is the likely cause of patient's                         dysphagia. Dilation is not recommend in the setting of                         esophagitis and repeat endoscopy in 6-8 weeks to                         reassess esophagitis and evaluate for any strictures                         as it heals would be recommended.                        -  Full dose PPI BID for 8-12 weeks                        - Follow an antireflux regimen.                        - Soft diet for 1 week, then advance as tolerated to                         resume previous diet.                        - Perform an H. pylori serology today.                        - The findings and recommendations were discussed with                         the patient.                        - Return to my office in 4 weeks.                        - Ok to resume coumading from GI  standpoint. Procedure Code(s):     --- Professional ---                        [phone], Esophagogastroduodenoscopy, flexible,                         transoral; diagnostic, including collection of                         specimen(s) by brushing or washing, when performed                         (separate procedure) Diagnosis Code(s):     --- Professional ---                        K20.90, Esophagitis, unspecified without bleeding                        K22.8, Other specified diseases of esophagus                        K31.89, Other diseases of stomach and duodenum                        R13.10, Dysphagia, unspecified CPT copyright 2019 American Medical Association. All rights reserved. The codes documented in this report are preliminary and upon coder review may  be revised to meet current compliance requirements.  Vonda Antigua, MD Margretta Sidle B. Bonna Gains MD, MD 08/19/2020 5:11:58 PM This report has been signed electronically. Number of Addenda: 0 Note Initiated On: 08/19/2020 8:52 AM Estimated Blood Loss:  Estimated blood loss: none.      [facility]

## 2020-08-19 NOTE — Progress Notes (Signed)
PROGRESS NOTE    Hector Mueller  NIO:[id number] DOB: 05/21/1950 DOA: 08/10/2020 PCP: [name], MD   Brief Narrative: Taken from H&P and prior notes. Hector Mueller is a 70 y.o. 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 Found to have CBG of 859, admitted for hyperosmolar hyperglycemia, initially managed with insulin infusion. Due to worsening agitation he was started on Precedex infusion, later transitioned off with improvement in mental status and agitation. Significantly improved-PT is recommending SNF placement. Esophageal gram was obtained due to his complaint of food sticking in chest and causing pain, it shows narrowing at distal esophagus, recommending endoscopy.  GI was consulted-   Holding Coumadin for now for possible EGD earlier next week. GI is recommending doing EGD during weekdays for better anesthesia support.  Patient has prior complicated history of intubations and a vocal cord paralysis.  Cardiology has cleared patient for planned EGD .  INR 1.8 today.  Recheck pending  Subjective: Patient has no new complaint today.  Hoping to get his endoscopy done  Assessment & Plan:   Principal Problem:   Type 2 diabetes mellitus with hyperosmolar hyperglycemic state (HHS) ([place]) Active Problems:   Atrial fibrillation (HCC)   HFrEF (heart failure with reduced ejection fraction) (HCC)   Hx of liver transplant ([place])   CKD (chronic kidney disease), stage III ([facility])  Acute metabolic encephalopathy.  Resolved  Most likely related to HHS, became quite agitated and somnolent.  PCO2 normal on ABG.  TSH mildly elevated  at 4.5, ammonia levels within normal limit, UA with some proteinuria but no pyuria or bacteriuria, UDS negative. Precedex infusion has been discontinued appears at baseline now. -Continue to monitor  Dysphagia.  Patient's symptoms were concerning for dysphagia, esophageal gram was obtained which shows distal esophageal narrowing and inability to pass pill and they were recommending endoscopic evaluation. GI wants to keep holding Coumadin and plan for EGD once INR comes down less than 1.5. - INR of 1.8 today, last dose of Coumadin was on 08/14/2020.  Rechecking INR again as patient received 2 FFP in the hope that endoscopy can be performed today if not it may be performed tomorrow Cardiology has cleared him for EGD  Paroxysmal atrial fibrillation.  Currently in sinus rhythm.  History of DCCV on 07/13/2020. -Continue home metoprolol -Holding Coumadin for EGD-INR was subtherapeutic not sure whether patient was taking it, per patient he was missing doses as keep forgetting to take his medications. -Will resume Coumadin after EGD once cleared from GI  Uncontrolled type 2 diabetes mellitus with HHS.  CBG currently within goal. -Continue with SSI -Continue Lantus 12 units daily  CKD stage IIIb.  Creatinine seems improved from his baseline with IV fluid, currently stable. IV fluids were discontinued. -Monitor renal function -Avoid nephrotoxins  History of liver cirrhosis secondary to hep C s/p liver transplant in 2018. Ammonia levels within normal limit and LFTs stable. -Continue home meds.  Anemia of chronic disease/thrombocytopenia.  Seems stable. -Continue to monitor  Chronic HFrEF with right heart failure/pulmonary hypertension.  Appears well compensated. -Continue home meds -Daily weight and BMP  Objective: Vitals:   08/19/20 1050 08/19/20 1114 08/19/20 1305 08/19/20 1340  BP: 127/65 125/62 133/65 Marland Kitchen)  132/58  Pulse: 60 (!) 56 (!) 59 (!) 57  Resp: 18 20 18 18   Temp: 97.9 F (36.6 C)  98.6 F (37 C) 97.9 F (36.6 C) 97.9 F (36.6 C)  TempSrc: Oral Oral Oral Oral  SpO2: 96% 95% 96% 97%  Weight:      Height:        Intake/Output Summary (Last 24 hours) at 08/19/2020 1432 Last data filed at 08/18/2020 1700 Gross per 24 hour  Intake 240 ml  Output --  Net 240 ml   Filed Weights   08/17/20 0500 08/18/20 0448 08/19/20 0435  Weight: 66.7 kg 69.6 kg 71.7 kg    Examination:  General.  Frail gentleman, in no acute distress. Pulmonary.  Lungs clear bilaterally, normal respiratory effort. CV.  Regular rate and rhythm, no JVD, rub or murmur. Abdomen.  Soft, nontender, nondistended, BS positive. CNS.  Alert and oriented x3.  No focal neurologic deficit. Extremities.  No edema, no cyanosis, pulses intact and symmetrical. Psychiatry.  Judgment and insight appears normal.  DVT prophylaxis: SCDs, Coumadin is  on hold Code Status: Full Family Communication: Discussed with patient Disposition Plan:  Status is: Inpatient  Remains inpatient appropriate because:Inpatient level of care appropriate due to severity of illness   Dispo: The patient is from: Home              Anticipated d/c is to: Home              Patient currently is not medically stable to d/c.   Difficult to place patient No              Level of care: Med-Surg  All the records are reviewed and case discussed with Care Management/Social Worker. Management plans discussed with the patient, nursing and they are in agreement.  Consultants:   PCCM  GI  Cardiology  Procedures:  Antimicrobials:   Data Reviewed: I have personally reviewed following labs and imaging studies  CBC: Recent Labs  Lab 08/15/20 0427 08/18/20 0440  WBC 5.3 3.7*  HGB 11.1* 11.1*  HCT 32.6* 31.4*  MCV 90.8 91.3  PLT 107* 254*   Basic Metabolic Panel: Recent Labs  Lab 08/13/20 0322 08/14/20 0531 08/15/20 0427 08/16/20 0440  NA 137 138 136 137  K 4.1 3.6 3.8 4.4  CL 108 108 106 106  CO2 23 22 21* 23  GLUCOSE  187* 110* 144* 148*  BUN 15 13 17 23   CREATININE 1.31* 1.43* 1.48* 1.79*  CALCIUM 8.7* 8.7* 9.0 8.9   GFR: Estimated Creatinine Clearance: 37.3 mL/min (A) (by C-G formula based on SCr of 1.79 mg/dL (H)). Liver Function Tests: No results for input(s): AST, ALT, ALKPHOS, BILITOT, PROT, ALBUMIN in the last 168 hours. No results for input(s): LIPASE, AMYLASE in the last 168 hours. No results for input(s): AMMONIA in the last 168 hours. Coagulation Profile: Recent Labs  Lab 08/16/20 0440 08/17/20 0508 08/18/20 0440 08/19/20 0440 08/19/20 1009  INR 2.4* 2.3* 1.8* 1.9* 1.8*   Cardiac Enzymes: No results for input(s): CKTOTAL, CKMB, CKMBINDEX, TROPONINI in the last 168 hours. BNP (last 3 results) No results for input(s): PROBNP in the last 8760 hours. HbA1C: No results for input(s): HGBA1C in the last 72 hours. CBG: Recent Labs  Lab 08/18/20 0917 08/18/20 1630 08/18/20 2102 08/19/20 0759 08/19/20 1115  GLUCAP 173* 211* 157* 146* 144*   Lipid Profile: No results for input(s): CHOL, HDL, LDLCALC, TRIG, CHOLHDL, LDLDIRECT in the last 72 hours. Thyroid Function Tests:  No results for input(s): TSH, T4TOTAL, FREET4, T3FREE, THYROIDAB in the last 72 hours. Anemia Panel: No results for input(s): VITAMINB12, FOLATE, FERRITIN, TIBC, IRON, RETICCTPCT in the last 72 hours. Sepsis Labs: No results for input(s): PROCALCITON, LATICACIDVEN in the last 168 hours.  Recent Results (from the past 240 hour(s))  Resp Panel by RT-PCR (Flu A&B, Covid) Nasopharyngeal Swab     Status: None   Collection Time: 08/10/20 12:08 AM   Specimen: Nasopharyngeal Swab; Nasopharyngeal(NP) swabs in vial transport medium  Result Value Ref Range Status   SARS Coronavirus 2 by RT PCR NEGATIVE NEGATIVE Final    Comment: (NOTE) SARS-CoV-2 target nucleic acids are NOT DETECTED.  The SARS-CoV-2 RNA is generally detectable in upper respiratory specimens during the acute phase of infection. The lowest concentration  of SARS-CoV-2 viral copies this assay can detect is 138 copies/mL. A negative result does not preclude SARS-Cov-2 infection and should not be used as the sole basis for treatment or other patient management decisions. A negative result may occur with  improper specimen collection/handling, submission of specimen other than nasopharyngeal swab, presence of viral mutation(s) within the areas targeted by this assay, and inadequate number of viral copies(<138 copies/mL). A negative result must be combined with clinical observations, patient history, and epidemiological information. The expected result is Negative.  Fact Sheet for Patients:  EntrepreneurPulse.com.au  Fact Sheet for Healthcare Providers:  IncredibleEmployment.be  This test is no t yet approved or cleared by the [place] FDA and  has been authorized for detection and/or diagnosis of SARS-CoV-2 by FDA under an Emergency Use Authorization (EUA). This EUA will remain  in effect (meaning this test can be used) for the duration of the COVID-19 declaration under Section 564(b)(1) of the Act, 21 U.S.C.section 360bbb-3(b)(1), unless the authorization is terminated  or revoked sooner.       Influenza A by PCR NEGATIVE NEGATIVE Final   Influenza B by PCR NEGATIVE NEGATIVE Final    Comment: (NOTE) The Xpert Xpress SARS-CoV-2/FLU/RSV plus assay is intended as an aid in the diagnosis of influenza from Nasopharyngeal swab specimens and should not be used as a sole basis for treatment. Nasal washings and aspirates are unacceptable for Xpert Xpress SARS-CoV-2/FLU/RSV testing.  Fact Sheet for Patients: EntrepreneurPulse.com.au  Fact Sheet for Healthcare Providers: IncredibleEmployment.be  This test is not yet approved or cleared by the [place] FDA and has been authorized for detection and/or diagnosis of SARS-CoV-2 by FDA under an Emergency Use  Authorization (EUA). This EUA will remain in effect (meaning this test can be used) for the duration of the COVID-19 declaration under Section 564(b)(1) of the Act, 21 U.S.C. section 360bbb-3(b)(1), unless the authorization is terminated or revoked.  Performed at [facility], [address] [place], [place] [address]   MRSA PCR Screening     Status: None   Collection Time: 08/11/20  2:17 AM   Specimen: Nasopharyngeal  Result Value Ref Range Status   MRSA by PCR NEGATIVE NEGATIVE Final    Comment:        The GeneXpert MRSA Assay (FDA approved for NASAL specimens only), is one component of a comprehensive MRSA colonization surveillance program. It is not intended to diagnose MRSA infection nor to guide or monitor treatment for MRSA infections. Performed at [facility], [address], [place], [place] [address]      Radiology Studies: NM Myocar Multi W/Spect Tamela Oddi Motion / EF  Result Date: 08/18/2020  There was no ST segment deviation noted  during stress.  No T wave inversion was noted during stress.  The study is normal.  This is a low risk study.  The left ventricular ejection fraction is normal (55-65%).  CT attenuation images showed moderate aortic and coronary calcifications.     Scheduled Meds: . amiodarone  200 mg Oral Daily  . Chlorhexidine Gluconate Cloth  6 each Topical Daily  . cycloSPORINE modified  200 mg Oral Daily  . dextromethorphan-guaiFENesin  1 tablet Oral BID  . DULoxetine  60 mg Oral QHS  . insulin aspart  0-5 Units Subcutaneous QHS  . insulin aspart  0-9 Units Subcutaneous TID WC  . insulin glargine  12 Units Subcutaneous Q24H  . lactulose  10 g Oral BID  . melatonin  2.5 mg Oral QHS  . metoprolol succinate  25 mg Oral BID  . mirtazapine  15 mg Oral QHS  . mycophenolate  500 mg Oral BID  . pantoprazole  40 mg Oral Daily  . pregabalin  75 mg Oral BID  . rosuvastatin  5 mg Oral Daily  . sildenafil  40 mg Oral TID  .  sodium bicarbonate  650 mg Oral TID  . sodium chloride flush  10 mL Intravenous Q12H  . Warfarin - Pharmacist Dosing Inpatient   Does not apply q1600   Continuous Infusions: . sodium chloride       LOS: 8 days   Time spent: 25 minutes. More than 50% of the time was spent in counseling/coordination of care  Max Sane, MD [facility]  If 7PM-7AM, please contact night-coverage [email or web address]  08/19/2020, 2:32 PM   This record has been created using Systems analyst. Errors have been sought and corrected,but may not always be located. Such creation errors do not reflect on the standard of care.

## 2020-08-19 NOTE — Progress Notes (Addendum)
Hector Antigua, MD [address] [place], [place], [place], [address] [address], [name], [place], [place], [address] Phone: [phone]  Fax: [phone]   Subjective:  Pt resting in bed comfortably. Denies any abdominal pain.   Objective: Exam: Vital signs in last 24 hours: Vitals:   08/18/20 1955 08/19/20 0435 08/19/20 0500 08/19/20 0820  BP: 119/65  137/62 134/70  Pulse: 79  64 (!) 55  Resp: 16  18 16   Temp: 98.6 F (37 C)  97.6 F (36.4 C) 98 F (36.7 C)  TempSrc: Oral  Oral Oral  SpO2: 95%  98% 96%  Weight:  71.7 kg    Height:       Weight change: 2.1 kg  Intake/Output Summary (Last 24 hours) at 08/19/2020 0901 Last data filed at 08/18/2020 1700 Gross per 24 hour  Intake 240 ml  Output --  Net 240 ml    General: No acute distress, AAO x3 Abd: Soft, NT/ND, No HSM Skin: Warm, no rashes Neck: Supple, Trachea midline   Lab Results: Lab Results  Component Value Date   WBC 3.7 (L) 08/18/2020   HGB 11.1 (L) 08/18/2020   HCT 31.4 (L) 08/18/2020   MCV 91.3 08/18/2020   PLT 135 (L) 08/18/2020   Micro Results: Recent Results (from the past 240 hour(s))  Resp Panel by RT-PCR (Flu A&B, Covid) Nasopharyngeal Swab     Status: None   Collection Time: 08/10/20 12:08 AM   Specimen: Nasopharyngeal Swab; Nasopharyngeal(NP) swabs in vial transport medium  Result Value Ref Range Status   SARS Coronavirus 2 by RT PCR NEGATIVE NEGATIVE Final    Comment: (NOTE) SARS-CoV-2 target nucleic acids are NOT DETECTED.  The SARS-CoV-2 RNA is generally detectable in upper respiratory specimens during the acute phase of infection. The lowest concentration of SARS-CoV-2 viral copies this assay can detect is 138 copies/mL. A negative result does not preclude SARS-Cov-2 infection and should not be used as the sole basis for treatment or other patient management decisions. A negative result may occur with  improper specimen collection/handling, submission of specimen  other than nasopharyngeal swab, presence of viral mutation(s) within the areas targeted by this assay, and inadequate number of viral copies(<138 copies/mL). A negative result must be combined with clinical observations, patient history, and epidemiological information. The expected result is Negative.  Fact Sheet for Patients:  EntrepreneurPulse.com.au  Fact Sheet for Healthcare Providers:  IncredibleEmployment.be  This test is no t yet approved or cleared by the [place] FDA and  has been authorized for detection and/or diagnosis of SARS-CoV-2 by FDA under an Emergency Use Authorization (EUA). This EUA will remain  in effect (meaning this test can be used) for the duration of the COVID-19 declaration under Section 564(b)(1) of the Act, 21 U.S.C.section 360bbb-3(b)(1), unless the authorization is terminated  or revoked sooner.       Influenza A by PCR NEGATIVE NEGATIVE Final   Influenza B by PCR NEGATIVE NEGATIVE Final    Comment: (NOTE) The Xpert Xpress SARS-CoV-2/FLU/RSV plus assay is intended as an aid in the diagnosis of influenza from Nasopharyngeal swab specimens and should not be used as a sole basis for treatment. Nasal washings and aspirates are unacceptable for Xpert Xpress SARS-CoV-2/FLU/RSV testing.  Fact Sheet for Patients: EntrepreneurPulse.com.au  Fact Sheet for Healthcare Providers: IncredibleEmployment.be  This test is not yet approved or cleared by the [place] FDA and has been authorized for detection and/or diagnosis of SARS-CoV-2 by FDA under an Emergency Use Authorization (  EUA). This EUA will remain in effect (meaning this test can be used) for the duration of the COVID-19 declaration under Section 564(b)(1) of the Act, 21 U.S.C. section 360bbb-3(b)(1), unless the authorization is terminated or revoked.  Performed at [facility], [address]  [place], [place] [address]   MRSA PCR Screening     Status: None   Collection Time: 08/11/20  2:17 AM   Specimen: Nasopharyngeal  Result Value Ref Range Status   MRSA by PCR NEGATIVE NEGATIVE Final    Comment:        The GeneXpert MRSA Assay (FDA approved for NASAL specimens only), is one component of a comprehensive MRSA colonization surveillance program. It is not intended to diagnose MRSA infection nor to guide or monitor treatment for MRSA infections. Performed at [facility], [address] [place], [place] [address]    Studies/Results: [place] Myocar Multi W/Spect Tamela Oddi Motion / EF  Result Date: 08/18/2020  There was no ST segment deviation noted during stress.  No T wave inversion was noted during stress.  The study is normal.  This is a low risk study.  The left ventricular ejection fraction is normal (55-65%).  CT attenuation images showed moderate aortic and coronary calcifications.    Medications:  Scheduled Meds: . sodium chloride   Intravenous Once  . amiodarone  200 mg Oral Daily  . Chlorhexidine Gluconate Cloth  6 each Topical Daily  . cycloSPORINE modified  200 mg Oral Daily  . dextromethorphan-guaiFENesin  1 tablet Oral BID  . DULoxetine  60 mg Oral QHS  . insulin aspart  0-5 Units Subcutaneous QHS  . insulin aspart  0-9 Units Subcutaneous TID WC  . insulin glargine  12 Units Subcutaneous Q24H  . lactulose  10 g Oral BID  . melatonin  2.5 mg Oral QHS  . metoprolol succinate  25 mg Oral BID  . mirtazapine  15 mg Oral QHS  . mycophenolate  500 mg Oral BID  . pantoprazole  40 mg Oral Daily  . pregabalin  75 mg Oral BID  . rosuvastatin  5 mg Oral Daily  . sildenafil  40 mg Oral TID  . sodium bicarbonate  650 mg Oral TID  . sodium chloride flush  10 mL Intravenous Q12H  . Warfarin - Pharmacist Dosing Inpatient   Does not apply q1600   Continuous Infusions: . sodium chloride     PRN Meds:.acetaminophen, dextrose, haloperidol lactate, LORazepam,  metoprolol tartrate, polyvinyl alcohol   Assessment: Principal Problem:   Type 2 diabetes mellitus with hyperosmolar hyperglycemic state (HHS) ([place]) Active Problems:   Atrial fibrillation (HCC)   HFrEF (heart failure with reduced ejection fraction) (HCC)   Hx of liver transplant ([place])   CKD (chronic kidney disease), stage III ([place]) Dysphagia   Plan: INR is above 1.5 this morning  I spent time coordinating with primary team and FFPs were ordered by them to help get the INR to less than 1.5  EGD can be done once INR is less than 1.5  Primary team has ordered 2 units of FFPs and based on our discussion repeat INR is planned after the first bag to see if INR is at goal, and if not he may need the second unit.  In coordinating with endo unit due to the above, we may be able to do this procedure late morning if INR is at goal, or procedure may potentially need to be delayed till the late afternoon vs tomorrow  Will continue  to coordinate with the team and proceed with procedure when appropriate given elevated INR  I have discussed alternative options, risks & benefits,  which include, but are not limited to, bleeding, infection, perforation,respiratory complication & drug reaction.  The patient agrees with this plan & written consent will be obtained.    Update: Patient has received 2 units of FFP's today, and most recent INR which was 1.6, was drawn at 3:17 PM, while the second bag of FFP's is still running currently at 4:36 pm.  Therefore, since the INR was drawn over an hour before the current FFP bag is completed, the INR value of 1.6 is not a true reflection of the current INR status. Given that the FFP's were not complete at the time of this lab draw an INR is likely lower than 1.6.  In addition, usually INR values are more reflective when done 1 hour after completion of FFP infusion.  Given the above, it is appropriate to proceed with upper endoscopy for this patient given that  Coumadin has been held and further holding, when INR is likely already at goal, would raise risks associated with holding Coumadin in this patient with A. Fib who was recently started on Coumadin about a month ago when he had A. fib with RVR on that admission.  This was discussed with the patient in detail pre-procedure as well and he is agreeable with proceeding   LOS: 8 days   Hector Antigua, MD 08/19/2020, 9:01 AM

## 2020-08-19 NOTE — Anesthesia Preprocedure Evaluation (Addendum)
Anesthesia Evaluation  Patient identified by MRN, date of birth, ID band Patient awake    Reviewed: Allergy & Precautions, H&P , NPO status , Patient's Chart, lab work & pertinent test results, reviewed documented beta blocker date and time   History of Anesthesia Complications Negative for: history of anesthetic complications  Airway Mallampati: II       Dental   Pulmonary neg sleep apnea, neg COPD, Not current smoker,           Cardiovascular Exercise Tolerance: Poor (-) hypertension+CHF  (-) Past MI + dysrhythmias Atrial Fibrillation      Neuro/Psych neg Seizures    GI/Hepatic GERD  Medicated and Controlled,(+) Cirrhosis       , Hepatitis -S/p Liver transplant   Endo/Other  negative endocrine ROSdiabetes, Type 2, Oral Hypoglycemic Agents  Renal/GU Renal InsufficiencyRenal disease     Musculoskeletal   Abdominal   Peds  Hematology   Anesthesia Other Findings   Reproductive/Obstetrics negative OB ROS                            Anesthesia Physical Anesthesia Plan  ASA: IV  Anesthesia Plan: General   Post-op Pain Management:    Induction: Intravenous  PONV Risk Score and Plan: 2 and Propofol infusion and TIVA  Airway Management Planned: Nasal Cannula  Additional Equipment:   Intra-op Plan:   Post-operative Plan:   Informed Consent: I have reviewed the patients History and Physical, chart, labs and discussed the procedure including the risks, benefits and alternatives for the proposed anesthesia with the patient or authorized representative who has indicated his/her understanding and acceptance.       Plan Discussed with:   Anesthesia Plan Comments:        Anesthesia Quick Evaluation

## 2020-08-19 NOTE — Progress Notes (Signed)
PT Cancellation Note  Patient Details Name: Hector Mueller MRN: [id number] DOB: 04/15/1950   Cancelled Treatment:    Reason Eval/Treat Not Completed: Other (comment).  Nurse reports pt will be leaving soon for EGD.  Will re-attempt PT session at a later date/time.  Leitha Bleak, PT 08/19/20, 4:02 PM

## 2020-08-19 NOTE — Consult Note (Signed)
[place] for Warfarin Dosing Indication: atrial fibrillation  Patient Measurements: Height: 5\' 9"  (175.3 cm) Weight: 71.7 kg (158 lb 1.1 oz) IBW/kg (Calculated) : 70.7  Labs: Recent Labs    08/17/20 0508 08/18/20 0440 08/19/20 0440  HGB  --  11.1*  --   HCT  --  31.4*  --   PLT  --  135*  --   LABPROT 25.0* 21.2* 21.9*  INR 2.3* 1.8* 1.9*    Estimated Creatinine Clearance: 37.3 mL/min (A) (by C-G formula based on SCr of 1.79 mg/dL (H)).   Medical History: Past Medical History:  Diagnosis Date  . Atrial fibrillation ([place])   . BPH (benign prostatic hyperplasia)   . Cancer ([place])    liver  . CHF (congestive heart failure) ([place])   . Chronic kidney disease   . Cirrhosis ([place])   . Diabetes mellitus without complication ([place])   . GERD (gastroesophageal reflux disease)   . Hepatitis C   . Hx of liver transplant ([place])   . Pulmonary HTN ([place])     Assessment: Pharmacy has been consulted for Warfarin dosing and monitoring in 70yo patient with history of Atrial Fibrillation.   Patient has been taking warfarin PTA with their home dose being 2.5mg  daily. 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  DDI: amiodarone and cyclosporine  Date INR Plan  5/31 1.1 HELD  6/1 N/A 3.75 mg  6/2 1.2 4 mg  6/3 1.6 5 mg  6/4 1.9 HELD  6/5 2.4 HELD  6/6 2.3 HELD  6/7 1.8 HELD  6/8 1.9 holding    Goal of Therapy:  INR 2-3   Plan:  --INR is slightly sub-therapeutic.  - Continue holding warfarin therapy for upcoming EGD per GI. Upper endoscopy can be done once INR is less than 1.5 (no bridge per hospitalist) DDI: amiodarone and cyclosporine (on both PTA) --INR daily --CBC at least every 3 days per protocol  Lu Duffel, PharmD, BCPS Clinical Pharmacist 08/19/2020 8:38 AM

## 2020-08-19 NOTE — Anesthesia Postprocedure Evaluation (Signed)
Anesthesia Post Note  Patient: Hector Mueller  Procedure(s) Performed: ESOPHAGOGASTRODUODENOSCOPY (EGD) (N/A )  Patient location during evaluation: Endoscopy Anesthesia Type: General Level of consciousness: awake and alert Pain management: pain level controlled Vital Signs Assessment: post-procedure vital signs reviewed and stable Respiratory status: spontaneous breathing and respiratory function stable Cardiovascular status: stable Anesthetic complications: no   No complications documented.   Last Vitals:  Vitals:   08/19/20 1704 08/19/20 1714  BP: (!) 92/46 (!) 97/49  Pulse: 63 65  Resp: 19 17  Temp: (!) 36.1 C   SpO2: 98% 95%    Last Pain:  Vitals:   08/19/20 1724  TempSrc:   PainSc: 0-No pain                 Tieara Flitton K

## 2020-08-19 NOTE — Transfer of Care (Signed)
Immediate Anesthesia Transfer of Care Note  Patient: Hector Mueller  Procedure(s) Performed: ESOPHAGOGASTRODUODENOSCOPY (EGD) (N/A )  Patient Location: PACU and Endoscopy Unit  Anesthesia Type:General  Level of Consciousness: drowsy  Airway & Oxygen Therapy: Patient Spontanous Breathing  Post-op Assessment: Report given to RN  Post vital signs: stable  Last Vitals:  Vitals Value Taken Time  BP 92/46 08/19/20 1705  Temp    Pulse 65 08/19/20 1705  Resp 16 08/19/20 1705  SpO2 97 % 08/19/20 1705  Vitals shown include unvalidated device data.  Last Pain:  Vitals:   08/19/20 1340  TempSrc: Oral  PainSc:          Complications: No complications documented.

## 2020-08-20 ENCOUNTER — Encounter: Payer: Self-pay | Admitting: Gastroenterology

## 2020-08-20 ENCOUNTER — Telehealth: Payer: Self-pay | Admitting: Internal Medicine

## 2020-08-20 ENCOUNTER — Telehealth: Payer: Self-pay | Admitting: Gastroenterology

## 2020-08-20 DIAGNOSIS — R410 Disorientation, unspecified: Secondary | ICD-10-CM

## 2020-08-20 DIAGNOSIS — K319 Disease of stomach and duodenum, unspecified: Secondary | ICD-10-CM

## 2020-08-20 LAB — BASIC METABOLIC PANEL
Anion gap: 7 (ref 5–15)
BUN: 27 mg/dL — ABNORMAL HIGH (ref 8–23)
CO2: 23 mmol/L (ref 22–32)
Calcium: 8.7 mg/dL — ABNORMAL LOW (ref 8.9–10.3)
Chloride: 101 mmol/L (ref 98–111)
Creatinine, Ser: 2.15 mg/dL — ABNORMAL HIGH (ref 0.61–1.24)
GFR, Estimated: 32 mL/min — ABNORMAL LOW (ref >60)
Glucose, Bld: 169 mg/dL — ABNORMAL HIGH (ref 70–99)
Potassium: 4.4 mmol/L (ref 3.5–5.1)
Sodium: 131 mmol/L — ABNORMAL LOW (ref 135–145)

## 2020-08-20 LAB — BPAM FFP
Blood Product Expiration Date: 202206132359
Blood Product Expiration Date: 202206132359
ISSUE DATE / TIME: 202206080847
ISSUE DATE / TIME: 202206081315
Unit Type and Rh: 5100
Unit Type and Rh: 5100

## 2020-08-20 LAB — CBC
HCT: 29.5 % — ABNORMAL LOW (ref 39.0–52.0)
Hemoglobin: 9.8 g/dL — ABNORMAL LOW (ref 13.0–17.0)
MCH: 31 pg (ref 26.0–34.0)
MCHC: 33.2 g/dL (ref 30.0–36.0)
MCV: 93.4 fL (ref 80.0–100.0)
Platelets: 144 10*3/uL — ABNORMAL LOW (ref 150–400)
RBC: 3.16 MIL/uL — ABNORMAL LOW (ref 4.22–5.81)
RDW: 14.6 % (ref 11.5–15.5)
WBC: 4 10*3/uL (ref 4.0–10.5)
nRBC: 0 % (ref 0.0–0.2)

## 2020-08-20 LAB — PREPARE FRESH FROZEN PLASMA

## 2020-08-20 LAB — PROTIME-INR
INR: 1.6 — ABNORMAL HIGH (ref 0.8–1.2)
Prothrombin Time: 19.2 seconds — ABNORMAL HIGH (ref 11.4–15.2)

## 2020-08-20 LAB — GLUCOSE, CAPILLARY
Glucose-Capillary: 169 mg/dL — ABNORMAL HIGH (ref 70–99)
Glucose-Capillary: 134 mg/dL — ABNORMAL HIGH (ref 70–99)

## 2020-08-20 MED ORDER — PANTOPRAZOLE SODIUM 40 MG PO TBEC: 40.0000 mg | DELAYED_RELEASE_TABLET | Freq: Two times a day (BID) | ORAL | 0 refills | Status: AC

## 2020-08-20 MED ORDER — WARFARIN SODIUM 2.5 MG PO TABS: 2.5000 mg | ORAL_TABLET | Freq: Every day | ORAL | 0 refills | Status: DC

## 2020-08-20 MED ORDER — WARFARIN SODIUM 5 MG PO TABS
5.0000 mg | ORAL_TABLET | ORAL | Status: AC
  Administered 2020-08-20: 5 mg via ORAL
  Filled 2020-08-20: qty 1
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
## 2020-08-20 NOTE — TOC Transition Note (Signed)
Transition of Care [facility]) - CM/SW Discharge Note   Patient Details  Name: Hector Mueller MRN: [id number] Date of Birth: Oct 12, 1950  [facility] CM/SW Contact:  Candie Chroman, LCSW Phone Number: 08/20/2020, 10:17 AM   Clinical Narrative: Patient has orders to discharge home today. [place] representative is aware. No further concerns. CSW signing off.    Final next level of care: [place] Barriers to Discharge: Barriers Resolved   Patient Goals and CMS Choice Patient states their goals for this hospitalization and ongoing recovery are:: home with continued home health, refusing SNF CMS Medicare.gov Compare Post Acute Care list provided to:: Patient Choice offered to / list presented to : NA  Discharge Placement                    Patient and family notified of of transfer: 08/20/20  Discharge Plan and Services                          HH Arranged: RN, PT [place] Agency: [place] ([place]) Date [place]: 08/20/20   Representative spoke with at [place]: Floydene Flock  Social Determinants of Health ([place]) Interventions     Readmission Risk Interventions Readmission Risk Prevention Plan 08/15/2020  Transportation Screening Complete  PCP or Specialist Appt within 3-5 Days Complete  HRI or [place] Complete  Social Work Consult for [place] Planning/Counseling Complete  Palliative Care Screening Not Applicable  Medication Review Press photographer) Complete  Some recent data might be hidden

## 2020-08-20 NOTE — Telephone Encounter (Signed)
LMOV to schedule  

## 2020-08-20 NOTE — Telephone Encounter (Signed)
Hector Manifold, MD  Acie Fredrickson I I see he has an appointment scheduled with me in August.  I need to see him sooner than that to follow-up with him for hospital follow-up.  Can he come inlast week of June or first week of July?   Called LVM for patient TCB

## 2020-08-20 NOTE — Telephone Encounter (Signed)
-----   Message from Arvil Chaco, PA-C sent at 08/19/2020 11:33 AM EDT ----- Regarding: New patient Hi there,  This patient was seen in the hospital by our team and needs follow-up after discharge with Dr. Saunders Revel or APP.   He also needs follow-up with the Coumadin clinic for INR checks.  I sent the Coumadin clinic a message about him as well.  Can we make sure he gets scheduled in our clinic within the next month, as well as gets his INR checks scheduled?  He is still admitted --> suspect he will be here through the end of the week.   Thanks! JV

## 2020-08-20 NOTE — Progress Notes (Signed)
Patient discharged home via wheelchair accompanied by family/friend.  Patient given pertinent information, prescriptions, and personal belongings. Patient able to teach back all discharge instructions. IV d/ced. No acute distress noted. Care relinquished.

## 2020-08-20 NOTE — Consult Note (Signed)
[place] for Warfarin Dosing Indication: atrial fibrillation  Patient Measurements: Height: 5\' 9"  (175.3 cm) Weight: 73.5 kg (162 lb 0.6 oz) IBW/kg (Calculated) : 70.7  Labs: Recent Labs    08/18/20 0440 08/19/20 0440 08/19/20 1009 08/19/20 1517 08/20/20 0421  HGB 11.1*  --   --   --  9.8*  HCT 31.4*  --   --   --  29.5*  PLT 135*  --   --   --  144*  LABPROT 21.2*   < > 20.8* 19.2* 19.2*  INR 1.8*   < > 1.8* 1.6* 1.6*  CREATININE  --   --   --   --  2.15*   < > = values in this interval not displayed.     Estimated Creatinine Clearance: 31.1 mL/min (A) (by C-G formula based on SCr of 2.15 mg/dL (H)).   Medical History: Past Medical History:  Diagnosis Date   Atrial fibrillation (HCC)    BPH (benign prostatic hyperplasia)    Cancer (HCC)    liver   CHF (congestive heart failure) (HCC)    Chronic kidney disease    Cirrhosis (HCC)    Diabetes mellitus without complication (HCC)    GERD (gastroesophageal reflux disease)    Hepatitis C    Hx of liver transplant ([place])    Pulmonary HTN ([place])     Assessment: Pharmacy has been consulted for Warfarin dosing and monitoring in 70yo patient with history of Atrial Fibrillation.   Patient has been taking warfarin PTA with their home dose being 2.5mg  daily. 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  DDI: amiodarone and cyclosporine  Date INR Plan  5/31 1.1 HELD  6/1 N/A 3.75 mg  6/2 1.2 4 mg  6/3 1.6 5 mg  6/4 1.9 HELD  6/5 2.4 HELD  6/6 2.3 HELD  6/7 1.8 HELD  6/8 1.9 HELD  6/9 1.6 5mg     Goal of Therapy:  INR 2-3   Plan:  --INR is sub-therapeutic @ 1.6  - Endoscopy complete and ok to resume warfarin per procedural note DDI: amiodarone and cyclosporine  - Will give double home dose (5mg ) now x 1 --INR daily --CBC at least every 3 days per protocol - Agree with resuming home dose 2.5mg  qd at discharge  Lu Duffel, PharmD, BCPS Clinical Pharmacist 08/20/2020 8:16 AM

## 2020-08-20 NOTE — Care Management Important Message (Signed)
Important Message  Patient Details  Name: Hector Mueller MRN: [id number] Date of Birth: 1950/06/15   Medicare Important Message Given:  No  Patient discharged prior to arrival to unit to deliver concurrent Medicare IM.  Recently received copy of 08/17/20.   [name] 08/20/2020, 12:29 PM

## 2020-08-20 NOTE — Progress Notes (Signed)
Hector Antigua, MD [address] [place], [place], [place], [address] [address], [place], [place], [place], [address] Phone: [phone]  Fax: [phone]   Subjective: Patient sitting up in bed.  He states his dysphagia is better.  No nausea or vomiting.  Tolerating oral diet.  No abdominal pain.   Objective: Exam: Vital signs in last 24 hours: Vitals:   08/20/20 0023 08/20/20 0425 08/20/20 0459 08/20/20 0855  BP: 126/60  (!) 109/58 126/73  Pulse: (!) 57  66 (!) 59  Resp: 18  16 20   Temp: (!) 97.4 F (36.3 C)   98.1 F (36.7 C)  TempSrc: Oral   Oral  SpO2: 94%  94% 98%  Weight:  73.5 kg    Height:       Weight change: 1.8 kg  Intake/Output Summary (Last 24 hours) at 08/20/2020 1125 Last data filed at 08/20/2020 1012 Gross per 24 hour  Intake 504 ml  Output 200 ml  Net 304 ml    General: No acute distress, AAO x3 Abd: Soft, NT/ND, No HSM Skin: Warm, no rashes Neck: Supple, Trachea midline   Lab Results: Lab Results  Component Value Date   WBC 4.0 08/20/2020   HGB 9.8 (L) 08/20/2020   HCT 29.5 (L) 08/20/2020   MCV 93.4 08/20/2020   PLT 144 (L) 08/20/2020   Micro Results: Recent Results (from the past 240 hour(s))  MRSA PCR Screening     Status: None   Collection Time: 08/11/20  2:17 AM   Specimen: Nasopharyngeal  Result Value Ref Range Status   MRSA by PCR NEGATIVE NEGATIVE Final    Comment:        The GeneXpert MRSA Assay (FDA approved for NASAL specimens only), is one component of a comprehensive MRSA colonization surveillance program. It is not intended to diagnose MRSA infection nor to guide or monitor treatment for MRSA infections. Performed at [facility], [address] [place], [place] [address]   KOH prep     Status: None   Collection Time: 08/19/20  5:09 PM  Result Value Ref Range Status   Specimen Description EXPECTORATED SPUTUM  Final   Special Requests NONE  Final   KOH Prep   Final    NO YEAST OR FUNGAL  ELEMENTS SEEN Performed at [facility], [address], [place], [place] [address]    Report Status 08/19/2020 FINAL  Final   Studies/Results: NM Myocar Multi W/Spect W/Wall Motion / EF  Result Date: 08/18/2020  There was no ST segment deviation noted during stress.  No T wave inversion was noted during stress.  The study is normal.  This is a low risk study.  The left ventricular ejection fraction is normal (55-65%).  CT attenuation images showed moderate aortic and coronary calcifications.    Medications:  Scheduled Meds:  amiodarone  200 mg Oral Daily   Chlorhexidine Gluconate Cloth  6 each Topical Daily   cycloSPORINE modified  200 mg Oral Daily   dextromethorphan-guaiFENesin  1 tablet Oral BID   DULoxetine  60 mg Oral QHS   insulin aspart  0-5 Units Subcutaneous QHS   insulin aspart  0-9 Units Subcutaneous TID WC   insulin glargine  12 Units Subcutaneous Q24H   lactulose  10 g Oral BID   melatonin  2.5 mg Oral QHS   metoprolol succinate  25 mg Oral BID   mirtazapine  15 mg Oral QHS   mycophenolate  500 mg Oral BID  pantoprazole  40 mg Oral BID   pregabalin  75 mg Oral BID   rosuvastatin  5 mg Oral Daily   sildenafil  40 mg Oral TID   sodium bicarbonate  650 mg Oral TID   sodium chloride flush  10 mL Intravenous Q12H   Warfarin - Pharmacist Dosing Inpatient   Does not apply q1600   Continuous Infusions:  sodium chloride     PRN Meds:.acetaminophen, dextrose, haloperidol lactate, LORazepam, metoprolol tartrate, polyvinyl alcohol   Assessment: Principal Problem:   Type 2 diabetes mellitus with hyperosmolar hyperglycemic state (HHS) ([place]) Active Problems:   Atrial fibrillation (HCC)   HFrEF (heart failure with reduced ejection fraction) (HCC)   Hx of liver transplant ([place])   CKD (chronic kidney disease), stage III (HCC)   Dysphagia   Acute esophagitis   Gastric erythema   Hiatal hernia   Delirium Dysphagia   Plan: Dysphagia is improving with  PPI Patient is tolerating oral diet without difficulty Patient advised to follow-up in clinic to schedule repeat upper endoscopy and evaluate for any ongoing symptoms with PPI  Acid reflux lifestyle modifications discussed as well  Esophageal brushings did not show any yeast   LOS: 9 days   Hector Antigua, MD 08/20/2020, 11:25 AM

## 2020-08-21 MED ORDER — MIRTAZAPINE 15 MG TABLET
ORAL_TABLET | Freq: Every evening | ORAL | 3 refills | 90 days
Start: 2020-08-21 — End: 2021-08-21

## 2020-08-21 MED ORDER — DOCUSATE SODIUM 100 MG CAPSULE
ORAL_CAPSULE | ORAL | 0 refills | 0 days
Start: 2020-08-21 — End: 2021-08-21

## 2020-08-21 MED ORDER — PREGABALIN 100 MG CAPSULE
ORAL_CAPSULE | 11 refills | 0 days
Start: 2020-08-21 — End: ?

## 2020-08-21 MED ORDER — AMIODARONE 200 MG TABLET
ORAL_TABLET | 10 refills | 0 days
Start: 2020-08-21 — End: 2021-08-21

## 2020-08-21 MED ORDER — ALENDRONATE 70 MG TABLET
ORAL_TABLET | ORAL | 3 refills | 84.00000 days
Start: 2020-08-21 — End: 2021-08-21

## 2020-08-21 MED ORDER — WARFARIN 1 MG TABLET
ORAL_TABLET | 0 refills | 0 days
Start: 2020-08-21 — End: ?

## 2020-08-21 MED ORDER — WARFARIN 3 MG TABLET
ORAL_TABLET | 2 refills | 0 days
Start: 2020-08-21 — End: ?

## 2020-08-21 MED ORDER — PANTOPRAZOLE DR 40 MG GRANULES DELAYED-RELEASE FOR SUSP IN PACKET
PACK | Freq: Every day | 0 refills | 30.00000 days
Start: 2020-08-21 — End: 2020-09-20

## 2020-08-21 MED ORDER — MELATONIN 3 MG TABLET
ORAL_TABLET | Freq: Every evening | ORAL | 0 refills | 30 days | Status: CN
Start: 2020-08-21 — End: ?

## 2020-08-21 MED ORDER — ONDANSETRON HCL 4 MG TABLET
ORAL_TABLET | Freq: Three times a day (TID) | ORAL | 0 refills | 3.00000 days | Status: CN | PRN
Start: 2020-08-21 — End: 2020-09-05

## 2020-08-21 MED FILL — CYCLOSPORINE MODIFIED 25 MG CAPSULE: ORAL | 30 days supply | Qty: 90 | Fill #3

## 2020-08-21 MED FILL — OZEMPIC 1 MG/DOSE (4 MG/3 ML) SUBCUTANEOUS PEN INJECTOR: SUBCUTANEOUS | 28 days supply | Qty: 3 | Fill #0

## 2020-08-21 MED FILL — DULOXETINE 30 MG CAPSULE,DELAYED RELEASE: ORAL | 30 days supply | Qty: 60 | Fill #0

## 2020-08-21 MED FILL — CYCLOSPORINE MODIFIED 100 MG CAPSULE: ORAL | 30 days supply | Qty: 90 | Fill #2

## 2020-08-21 MED FILL — MG-PLUS-PROTEIN 133 MG TABLET: ORAL | 30 days supply | Qty: 120 | Fill #4

## 2020-08-21 MED FILL — MYCOPHENOLATE MOFETIL 250 MG CAPSULE: ORAL | 30 days supply | Qty: 120 | Fill #3

## 2020-08-21 NOTE — Unmapped (Signed)
Refusing refill, 70 y/o rx.

## 2020-08-21 NOTE — Unmapped (Signed)
Pt request for RX Refill melatonin 3 mg Tab

## 2020-08-21 NOTE — Unmapped (Signed)
Refusing refill request. 70 y/o rx.

## 2020-08-21 NOTE — Unmapped (Signed)
Pt request for RX Refill mirtazapine (REMERON) 15 MG tablet

## 2020-08-21 NOTE — Unmapped (Signed)
Pt request for RX Refill warfarin (JANTOVEN) 1 MG tablet

## 2020-08-21 NOTE — Unmapped (Signed)
[facility] Refill Coordination Note    Specialty Medication(s) to be Shipped:   Transplant: mycophenolate mofetil 250mg , cyclosporine 25mg  and cyclosporine 100mg     Other medication(s) to be shipped: chlorthalidone, duloxetine, magnesium and ozempic     Jeffrey Ward, DOB: 11-25-1950  Phone: [phone] (home)       All above HIPAA information was verified with patient.     Was a Nurse, learning disability used for this call? No    Completed refill call assessment today to schedule patient's medication shipment from the [facility] [phone]).  All relevant notes have been reviewed.     Specialty medication(s) and dose(s) confirmed: Regimen is correct and unchanged.   Changes to medications: Jeffrey Ward reports no changes at this time.  Changes to insurance: No  New side effects reported not previously addressed with a pharmacist or physician: None reported  Questions for the pharmacist: No    Confirmed patient received a Conservation officer, historic buildings and a Surveyor, mining with first shipment. The patient will receive a drug information handout for each medication shipped and additional FDA Medication Guides as required.       DISEASE/MEDICATION-SPECIFIC INFORMATION        N/A    SPECIALTY MEDICATION ADHERENCE     Medication Adherence    Patient reported X missed doses in the last month: 0  Specialty Medication: cycloSPORINE modified (NEORAL) 100 MG capsule  Patient is on additional specialty medications: Yes  Additional Specialty Medications: cycloSPORINE modified (NEORAL) 25 MG capsule  Patient Reported Additional Medication X Missed Doses in the Last Month: 0  Patient is on more than two specialty medications: Yes  Specialty Medication: Mycophenolate 250mg   Patient Reported Additional Medication X Missed Doses in the Last Month: 0  Support network for adherence: family member        Were doses missed due to medication being on hold? No    Cyclosporine 25 mg: 0 days of medicine on hand Cyclosporine 100 mg: 0 days of medicine on hand   Mycophenolate 250 mg: 0 days of medicine on hand     REFERRAL TO PHARMACIST     Referral to the pharmacist: Not needed      [facility]     Shipping address confirmed in Epic.     Delivery Scheduled: Yes, Expected medication delivery date: 08/21/2020.     Medication will be delivered via Same Day Courier to the prescription address in Epic WAM.    [name]   [facility] Specialty Technician

## 2020-08-21 NOTE — Unmapped (Signed)
Pt request for RX Refill amiodarone (PACERONE) 200 MG tablet

## 2020-08-21 NOTE — Unmapped (Signed)
Pt request for RX Refill pantoprazole (PROTONIX) 40 mg GrPS

## 2020-08-21 NOTE — Unmapped (Signed)
Pt request for RX Refill warfarin (JANTOVEN) 3 MG tablet

## 2020-08-22 NOTE — Discharge Summary (Signed)
[place] [facility] [place] NAME: Hector Mueller    MR#:  [id number]  DATE OF BIRTH:  April 17, 1950  DATE OF ADMISSION:  08/10/2020   ADMITTING PHYSICIAN: Lenore Cordia, MD  DATE OF DISCHARGE: 08/20/2020 12:59 PM  PRIMARY CARE PHYSICIAN: [name], MD   ADMISSION DIAGNOSIS:  Delirium [R41.0] Type 2 diabetes mellitus with hyperosmolar hyperglycemic state (HHS) ([place]) [E11.00, E11.65] DISCHARGE DIAGNOSIS:  Principal Problem:   Type 2 diabetes mellitus with hyperosmolar hyperglycemic state (HHS) ([place]) Active Problems:   Atrial fibrillation (HCC)   HFrEF (heart failure with reduced ejection fraction) (HCC)   Hx of liver transplant (HCC)   CKD (chronic kidney disease), stage III (HCC)   Dysphagia   Acute esophagitis   Gastric erythema   Hiatal hernia   Delirium  SECONDARY DIAGNOSIS:   Past Medical History:  Diagnosis Date   Atrial fibrillation (HCC)    BPH (benign prostatic hyperplasia)    Cancer (HCC)    liver   CHF (congestive heart failure) (HCC)    Chronic kidney disease    Cirrhosis (HCC)    Diabetes mellitus without complication (HCC)    GERD (gastroesophageal reflux disease)    Hepatitis C    Hx of liver transplant ([place])    Pulmonary HTN ([place])    HOSPITAL COURSE:  70 y.o. 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  Acute metabolic encephalopathy.  POA & Resolved Found to have CBG of 859, admitted for hyperosmolar hyperglycemia, initially managed with insulin infusion. Due to worsening agitation he was started on Precedex infusion, later transitioned off with improvement in mental status and  agitation.at baseline now.  Dysphagia.  Patient's symptoms were concerning for dysphagia, esophagogram showed distal esophageal narrowing and inability to pass pill. S/p EGD on 6/8 showing severe esophagitis. - LA Grade D (one or more mucosal breaks involving at least 75% of esophageal circumference) esophagitis with no bleeding was found in the distal esophagus - tolerating diet fine post EGD   Paroxysmal atrial fibrillation.  Currently in sinus rhythm.  History of DCCV on 07/13/2020. -Continue home metoprolol -resume coumadin at DC   Uncontrolled type 2 diabetes mellitus with HHS.  CBG currently within goal. -resume home regimen at DC   CKD stage IIIb.  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 Ammonia levels within normal limit and LFTs stable.   Anemia of chronic disease/thrombocytopenia.  stable.   Chronic HFrEF with right heart failure/pulmonary hypertension.  Appears well compensated.   DISCHARGE CONDITIONS:  stable CONSULTS OBTAINED:  Treatment Team:  Wellington Hampshire, MD DRUG ALLERGIES:  No Known Allergies DISCHARGE MEDICATIONS:   Allergies as of 08/20/2020   No Known Allergies      Medication List     STOP taking these medications    Basaglar KwikPen 100 UNIT/ML   chlorthalidone 25 MG tablet Commonly known as: HYGROTON   omeprazole 20 MG capsule Commonly known as: PRILOSEC Replaced by: pantoprazole 40 MG tablet       TAKE these medications    alendronate 70 MG tablet Commonly known as: FOSAMAX Take 70 mg by mouth once a week. Take with a full glass  of water on an empty stomach.   amiodarone 200 MG tablet Commonly known as: PACERONE Take 1 tablet (200 mg total) by mouth 2 (two) times daily for 7 days, THEN 1 tablet (200 mg total) daily. Start taking on: Jul 21, 2020   bisacodyl 5 MG EC tablet Commonly known as: DULCOLAX Take 5 mg by mouth daily as needed for moderate constipation.   COGNEX  PO Take 2 capsules by mouth daily at 6 (six) AM. With a glass of water   cycloSPORINE modified 100 MG capsule Commonly known as: NEORAL Take 200 mg by mouth daily.   cycloSPORINE modified 25 MG capsule Commonly known as: NEORAL Take 175 mg by mouth every evening.   DULoxetine 30 MG capsule Commonly known as: CYMBALTA Take 60 mg by mouth at bedtime.   magnesium (amino acid chelate) 133 MG tablet Take 2 tablets by mouth 2 (two) times daily.   melatonin 3 MG Tabs tablet Take 3 mg by mouth at bedtime.   metoprolol succinate 25 MG 24 hr tablet Commonly known as: TOPROL-XL Take 1 tablet (25 mg total) by mouth 2 (two) times daily.   mirtazapine 15 MG tablet Commonly known as: REMERON Take 15 mg by mouth at bedtime.   mycophenolate 250 MG capsule Commonly known as: CELLCEPT Take 500 mg by mouth in the morning and at bedtime.   ondansetron 4 MG tablet Commonly known as: ZOFRAN Take 1 tablet (4 mg total) by mouth every 6 (six) hours as needed for nausea.   Ozempic (1 MG/DOSE) 4 MG/3ML Sopn Generic drug: Semaglutide (1 MG/DOSE) Inject 1 mg into the skin once a week. What changed: Another medication with the same name was removed. Continue taking this medication, and follow the directions you see here.   pantoprazole 40 MG tablet Commonly known as: PROTONIX Take 1 tablet (40 mg total) by mouth 2 (two) times daily. Replaces: omeprazole 20 MG capsule   pregabalin 75 MG capsule Commonly known as: LYRICA Take 75 mg by mouth in the morning and at bedtime.   rosuvastatin 5 MG tablet Commonly known as: CRESTOR Take 5 mg by mouth daily.   sildenafil 20 MG tablet Commonly known as: REVATIO Take 40 mg by mouth 3 (three) times daily.   sodium bicarbonate 650 MG tablet Take 650 mg by mouth in the morning and at bedtime.   Vitamin D3 125 MCG (5000 UT) Caps Take 3,000 Units by mouth daily.   warfarin 2.5 MG tablet Commonly known as: Coumadin Take 1 tablet (2.5 mg total) by  mouth daily.       DISCHARGE INSTRUCTIONS:   DIET:  Cardiac diet DISCHARGE CONDITION:  Stable ACTIVITY:  Activity as tolerated OXYGEN:  Home Oxygen: No.  Oxygen Delivery: room air DISCHARGE LOCATION:  Home with [place]   If you experience worsening of your admission symptoms, develop shortness of breath, life threatening emergency, suicidal or homicidal thoughts you must seek medical attention immediately by calling 911 or calling your MD immediately  if symptoms less severe.  You Must read complete instructions/literature along with all the possible adverse reactions/side effects for all the Medicines you take and that have been prescribed to you. Take any new Medicines after you have completely understood and accpet all the possible adverse reactions/side effects.   Please note  You were cared for by a hospitalist during your hospital stay. If you have any questions about your discharge medications or the care you received while you were in the hospital after you are  discharged, you can call the unit and asked to speak with the hospitalist on call if the hospitalist that took care of you is not available. Once you are discharged, your primary care physician will handle any further medical issues. Please note that NO REFILLS for any discharge medications will be authorized once you are discharged, as it is imperative that you return to your primary care physician (or establish a relationship with a primary care physician if you do not have one) for your aftercare needs so that they can reassess your need for medications and monitor your lab values.    On the day of Discharge:  VITAL SIGNS:  Blood pressure 126/73, pulse (!) 59, temperature 98.1 F (36.7 C), temperature source Oral, resp. rate 20, height 5\' 9"  (1.753 m), weight 73.5 kg, SpO2 98 %. PHYSICAL EXAMINATION:  GENERAL:  70 y.o.-year-old patient lying in the bed with no acute distress.  EYES: Pupils equal, round, reactive to  light and accommodation. No scleral icterus. Extraocular muscles intact.  HEENT: Head atraumatic, normocephalic. Oropharynx and nasopharynx clear.  NECK:  Supple, no jugular venous distention. No thyroid enlargement, no tenderness.  LUNGS: Normal breath sounds bilaterally, no wheezing, rales,rhonchi or crepitation. No use of accessory muscles of respiration.  CARDIOVASCULAR: S1, S2 normal. No murmurs, rubs, or gallops.  ABDOMEN: Soft, non-tender, non-distended. Bowel sounds present. No organomegaly or mass.  EXTREMITIES: No pedal edema, cyanosis, or clubbing.  NEUROLOGIC: Cranial nerves II through XII are intact. Muscle strength 5/5 in all extremities. Sensation intact. Gait not checked.  PSYCHIATRIC: The patient is alert and oriented x 3.  SKIN: No obvious rash, lesion, or ulcer.  DATA REVIEW:   CBC Recent Labs  Lab 08/20/20 0421  WBC 4.0  HGB 9.8*  HCT 29.5*  PLT 144*    Chemistries  Recent Labs  Lab 08/20/20 0421  NA 131*  K 4.4  CL 101  CO2 23  GLUCOSE 169*  BUN 27*  CREATININE 2.15*  CALCIUM 8.7*     Outpatient follow-up  Follow-up Information     [name], MD. Go on 08/24/2020.   Specialty: Internal Medicine Why: [facility] Discharge F/UP 11am appointment Contact information: [place] [address] [phone]         Jolyn Lent, MD. Schedule an appointment as soon as possible for a visit in 1 week(s).   Specialty: Nephrology Why: [facility] Discharge F/UP(left message for them to call the patient) Contact information: [address] [place] [address] [phone]         Virgel Manifold, MD. Schedule an appointment as soon as possible for a visit on 10/28/2020.   Specialty: Gastroenterology Why: [facility] Discharge F/UP 2:15pm appointment Contact information: [place] [address] [place], [facility] Follow up.   Specialty: Home Health Services Why: They will resume home health services once discharged from the hospital.                30 Day Unplanned Readmission Risk Score    Flowsheet Row ED to Hosp-Admission (Discharged) from 08/10/2020 in [place]  30 Day Unplanned Readmission Risk Score (%) 31.91 Filed at 08/20/2020 1200       This score is the patient's risk of an unplanned readmission within 30 days of being discharged (0 -100%). The score is based  on dignosis, age, lab data, medications, orders, and past utilization.   Low:  0-14.9   Medium: 15-21.9   High: 22-29.9   Extreme: 30 and above           Management plans discussed with the patient, family and they are in agreement.  CODE STATUS: Prior   TOTAL TIME TAKING CARE OF THIS PATIENT: 45 minutes.    Max Sane M.D on 08/22/2020 at 4:53 PM  [facility]   CC: Primary care physician; [name], MD   Note: This dictation was prepared with Dragon dictation along with smaller phrase technology. Any transcriptional errors that result from this process are unintentional.

## 2020-08-24 MED FILL — ALENDRONATE 70 MG TABLET: ORAL | 84 days supply | Qty: 12 | Fill #0

## 2020-08-24 NOTE — Telephone Encounter (Signed)
Attempted to contact pt to set up INR check. Lmom for pt to call back to [facility] office and schedule this, as well as hospital f/u w/ Dr. Saunders Revel.

## 2020-08-27 ENCOUNTER — Institutional Professional Consult (permissible substitution): Admit: 2020-08-27 | Discharge: 2020-08-28 | Payer: MEDICARE

## 2020-08-27 DIAGNOSIS — Z45018 Encounter for adjustment and management of other part of cardiac pacemaker: Principal | ICD-10-CM

## 2020-08-27 NOTE — Unmapped (Signed)
All appointments with [facility] EP REMOTE MONITORING occur from home.  You do not come in to the office or hospital for these visits.        Your Device data report from this visit can be found in your Mychart by following the steps below.  Step #1-Select Menu then document center    Step #2-Select my documents    Step #3-Select Device checks    Please contact us with any questions.    Thank you,    [facility] EP REMOTE MONITORING [facility]  Phone. [phone]  Fax. [phone]  Email. [email or web address]

## 2020-08-27 NOTE — Unmapped (Signed)
Cardiac Implantable Electronic Device Remote Monitoring     Visit Date:  08/27/2020    Findings: Normal device function, Tested Lead measurements stable and within normal range, Adequate battery reserve->8 years    Arrhythmias: No significant new atrial or ventricular arrhythmias    Plan: Continue Routine Remote Monitoring     Last device clinic appointment: needs annual device check   __________________________________________________________________    Manufacturer of Device: Medtronic       Type of Device: Single Chamber Pacemaker  See scanned/downloaded PDF report for model numbers, serial numbers, and date(s) of implant.    Presenting Rhythm: VS    ______________________________________________________________________  Percentage Biventricular Pacing: n/a    Heart Failure Monitoring: Not Applicable  ______________________________________________________________________      Please see downloaded PDF file of transmission under Media Tab  for full details of device interrogation to include, when applicable,  battery status/charge time, lead trend data, and programmed parameters.

## 2020-08-30 NOTE — Unmapped (Signed)
I have reviewed the battery, threshold, lead impedance, and overall device/lead status, diagnostic data including arrhythmic events and therapies on the remote device evaluation and agree with the documentation.    Kyser Wandel, MD

## 2020-09-09 MED ORDER — PREGABALIN 100 MG CAPSULE
ORAL_CAPSULE | 11 refills | 0 days
Start: 2020-09-09 — End: ?

## 2020-09-10 DIAGNOSIS — G894 Chronic pain syndrome: Principal | ICD-10-CM

## 2020-09-11 MED ORDER — SPS (WITH SORBITOL) 15 GRAM-20 GRAM/60 ML ORAL SUSPENSION
0 refills | 0 days
Start: 2020-09-11 — End: ?

## 2020-09-15 NOTE — Unmapped (Signed)
[facility] Clinical Assessment & Refill Coordination Note    Jeffrey Ward, DOB: 1950/08/19  Phone: [phone] (home)     All above HIPAA information was verified with patient.     Was a Nurse, learning disability used for this call? No    Specialty Medication(s):   Transplant: mycophenolate mofetil 250mg , cyclosporine 25mg  and cyclosporine 100mg      Current Outpatient Medications   Medication Sig Dispense Refill   ??? alendronate (FOSAMAX) 70 MG tablet Take 1 tablet (70 mg total) by mouth every seven (7) days. 12 tablet 3   ??? bisacodyl (DULCOLAX, BISACODYL,) 5 mg EC tablet Take 5 mg by mouth daily as needed.      ??? blood sugar diagnostic Strp Use three (3) times a day before meals. 100 each 7   ??? blood-glucose meter (GLUCOSE MONITORING KIT) kit Use as instructed 1 each 0   ??? chlorthalidone (HYGROTON) 25 MG tablet Take 1/2 tablet (12.5 mg total) by mouth every morning. 30 tablet 0   ??? cholecalciferol, vitamin D3, 125 mcg (5,000 unit) tablet Take 125 mcg by mouth daily.     ??? cream base no.171, bulk, Crea Apply to affected area as needed up to 4 times daily 60 g 0   ??? cycloSPORINE modified (NEORAL) 100 MG capsule Take 2 capsules (200 mg total) in the morning AND 1 capsule (with 3 x 25mg  capsules) nightly. For a total dose of 200 mg in the morning , 175mg  every evening. 270 capsule 3   ??? cycloSPORINE modified (NEORAL) 25 MG capsule Take 3 capsules (75 mg total) by mouth nightly (with a 100mg  capsule). For a total dose of 200 mg in the morning , 175mg  every evening 270 capsule 3   ??? DULoxetine (CYMBALTA) 30 MG capsule Take 2 capsules (60 mg total) by mouth nightly. 60 capsule 11   ??? magnesium oxide-Mg AA chelate (MAGNESIUM, AMINO ACID CHELATE,) 133 mg Tab Take 2 tablets by mouth Two (2) times a day. 120 tablet PRN   ??? melatonin 3 mg Tab Take 1 tablet (3 mg total) by mouth nightly. 30 tablet 0   ??? mirtazapine (REMERON) 15 MG tablet Take 1 tablet (15 mg total) by mouth nightly. 90 tablet 3   ??? mycophenolate (CELLCEPT) 250 mg capsule Take 2 capsules (500 mg total) by mouth two (2) times a day. 120 capsule 7   ??? pen needle, diabetic (ULTICARE PEN NEEDLE) 32 gauge x 5/32 (4 mm) Ndle USE WITH INSULIN PEN 4 TIMES DAILY AS DIRECTED 100 each 1   ??? predniSONE (DELTASONE) 5 MG tablet Take 4 tablets (20 mg) by mouth for one day, then take 3 tablets (15 mg) the next day, then take 2 tablets (10 mg) the next day, then take 1 tablet (5mg ) then stop 10 tablet 0   ??? pregabalin (LYRICA) 100 MG capsule Take 1 capsule (100 mg total) by mouth 2 (two) times daily 60 capsule 11   ??? pregabalin (LYRICA) 75 MG capsule Take 1 capsule (75 mg total) by mouth Two (2) times a day. 60 capsule 6   ??? rosuvastatin (CRESTOR) 5 MG tablet Take 1 tablet (5 mg total) by mouth daily. 90 tablet 1   ??? semaglutide (OZEMPIC) 1 mg/dose (4 mg/3 mL) PnIj injection Inject 1 mg under the skin every seven (7) days. 9 mL 3   ??? sildenafiL, pulm.hypertension, (REVATIO) 20 mg tablet Take 2 tablets (40 mg total) by mouth Three (3) times a day. 180 tablet 2   ???  sodium bicarbonate 650 mg tablet Take 1 tablet (650 mg total) by mouth two (2) times a day. 180 tablet 3   ??? sodium polystyrene, SPS, with sorbitol (SPS, WITH SORBITOL,) 15-20 gram/60 mL Susp Take 10mL by mouth once for one dose 10 mL 0     No current facility-administered medications for this visit.        Changes to medications: Jeffrey Ward reports no changes at this time.    No Known Allergies    Changes to allergies: No    SPECIALTY MEDICATION ADHERENCE     Cyclosporine 25 mg: 7 days of medicine on hand   Cyclosporine 100 mg: 7 days of medicine on hand   Mycophenolate 250 mg: 7 days of medicine on hand       Medication Adherence    Patient reported X missed doses in the last month: 0  Specialty Medication: Cyclosporine 100mg   Patient is on additional specialty medications: Yes  Additional Specialty Medications: Cyclosporine 25mg   Patient Reported Additional Medication X Missed Doses in the Last Month: 0  Patient is on more than two specialty medications: Yes  Specialty Medication: Mycophenolate 250mg   Patient Reported Additional Medication X Missed Doses in the Last Month: 0  Support network for adherence: family member          Specialty medication(s) dose(s) confirmed: Regimen is correct and unchanged.     Are there any concerns with adherence? No    Adherence counseling provided? Not needed    CLINICAL MANAGEMENT AND INTERVENTION      Clinical Benefit Assessment:    Do you feel the medicine is effective or helping your condition? Yes    Clinical Benefit counseling provided? Not needed    Adverse Effects Assessment:    Are you experiencing any side effects? No    Are you experiencing difficulty administering your medicine? No    Quality of Life Assessment:         How many days over the past month did your liver transplant  keep you from your normal activities? For example, brushing your teeth or getting up in the morning. 0    Have you discussed this with your provider? Not needed    Acute Infection Status:    Acute infections noted within Epic:  No active infections  Patient reported infection: None    Therapy Appropriateness:    Is therapy appropriate? Yes, therapy is appropriate and should be continued    DISEASE/MEDICATION-SPECIFIC INFORMATION      N/A    PATIENT SPECIFIC NEEDS     - Does the patient have any physical, cognitive, or cultural barriers? No    - Is the patient high risk? Yes, patient is taking a REMS drug. Medication is dispensed in compliance with REMS program    - Does the patient require a Care Management Plan? No     - Does the patient require physician intervention or other additional services (i.e. nutrition, smoking cessation, social work)? No      SHIPPING     Specialty Medication(s) to be Shipped:   Transplant: mycophenolate mofetil 250mg , cyclosporine 25mg  and cyclosporine 100mg     Other medication(s) to be shipped: ozempic, pregabalin, sod bicarb, mg. duloxetine     Changes to insurance: No    Delivery Scheduled: Yes, Expected medication delivery date: 09/18/20.     Medication will be delivered via Same Day Courier to the confirmed prescription address in [place].    The patient will receive a drug information  handout for each medication shipped and additional FDA Medication Guides as required.  Verified that patient has previously received a Conservation officer, historic buildings and a Surveyor, mining.    The patient or caregiver noted above participated in the development of this care plan and knows that they can request review of or adjustments to the care plan at any time.      All of the patient's questions and concerns have been addressed.    Tera Helper   [facility] Specialty Pharmacist

## 2020-09-17 MED FILL — SPS (WITH SORBITOL) 15 GRAM-20 GRAM/60 ML ORAL SUSPENSION: 1 days supply | Qty: 10 | Fill #0

## 2020-09-18 DIAGNOSIS — G894 Chronic pain syndrome: Principal | ICD-10-CM

## 2020-09-18 MED FILL — MYCOPHENOLATE MOFETIL 250 MG CAPSULE: ORAL | 30 days supply | Qty: 120 | Fill #4

## 2020-09-18 MED FILL — CYCLOSPORINE MODIFIED 100 MG CAPSULE: ORAL | 30 days supply | Qty: 90 | Fill #3

## 2020-09-18 MED FILL — MG-PLUS-PROTEIN 133 MG TABLET: ORAL | 30 days supply | Qty: 120 | Fill #5

## 2020-09-18 MED FILL — SODIUM BICARBONATE 650 MG TABLET: ORAL | 90 days supply | Qty: 180 | Fill #2

## 2020-09-18 MED FILL — OZEMPIC 1 MG/DOSE (4 MG/3 ML) SUBCUTANEOUS PEN INJECTOR: SUBCUTANEOUS | 28 days supply | Qty: 3 | Fill #1

## 2020-09-18 MED FILL — PREGABALIN 100 MG CAPSULE: 30 days supply | Qty: 60 | Fill #0

## 2020-09-18 MED FILL — DULOXETINE 30 MG CAPSULE,DELAYED RELEASE: ORAL | 30 days supply | Qty: 60 | Fill #1

## 2020-09-18 MED FILL — CYCLOSPORINE MODIFIED 25 MG CAPSULE: ORAL | 30 days supply | Qty: 90 | Fill #4

## 2020-09-30 ENCOUNTER — Ambulatory Visit: Payer: Medicare Other | Admitting: Urology

## 2020-10-02 ENCOUNTER — Encounter: Payer: Self-pay | Admitting: Urology

## 2020-10-12 NOTE — Unmapped (Signed)
TRF

## 2020-10-13 MED ORDER — CHLORTHALIDONE 25 MG TABLET
ORAL_TABLET | Freq: Every morning | ORAL | 0 refills | 60.00000 days
Start: 2020-10-13 — End: 2021-10-13

## 2020-10-13 NOTE — Unmapped (Addendum)
[facility] Refill Coordination Note    Specialty Medication(s) to be Shipped:   Transplant: mycophenolate mofetil 250mg , cyclosporine 25mg  and cyclosporine 100mg     Other medication(s) to be shipped: pregabalin,rosuvastatin,sildenafil,chlorthalidone, duloxetine, magnesium and ozempic     Jeffrey Ward, DOB: 1950-09-30  Phone: [phone] (home)       All above HIPAA information was verified with patient.     Was a Nurse, learning disability used for this call? No    Completed refill call assessment today to schedule patient's medication shipment from the [facility] [phone]).  All relevant notes have been reviewed.     Specialty medication(s) and dose(s) confirmed: Regimen is correct and unchanged.   Changes to medications: Nadia reports no changes at this time.  Changes to insurance: No  New side effects reported not previously addressed with a pharmacist or physician: None reported  Questions for the pharmacist: No    Confirmed patient received a Conservation officer, historic buildings and a Surveyor, mining with first shipment. The patient will receive a drug information handout for each medication shipped and additional FDA Medication Guides as required.       DISEASE/MEDICATION-SPECIFIC INFORMATION        N/A    SPECIALTY MEDICATION ADHERENCE     Medication Adherence    Patient reported X missed doses in the last month: 0  Specialty Medication: cycloSPORINE modified (NEORAL) 100 MG capsule  Patient is on additional specialty medications: Yes  Additional Specialty Medications: cycloSPORINE modified (NEORAL) 25  MG capsule  Patient Reported Additional Medication X Missed Doses in the Last Month: 0  Patient is on more than two specialty medications: Yes  Specialty Medication: mycophenolate (CELLCEPT) 250 mg capsule  Patient Reported Additional Medication X Missed Doses in the Last Month: 0  Any gaps in refill history greater than 2 weeks in the last 3 months: no  Demonstrates understanding of importance of adherence: yes  Informant: patient  Reliability of informant: reliable  Support network for adherence: family member  Confirmed plan for next specialty medication refill: delivery by pharmacy  Refills needed for supportive medications: yes, ordered or provider notified        Were doses missed due to medication being on hold? No    Cyclosporine 25 mg: 5 days of medicine on hand   Cyclosporine 100 mg: 5 days of medicine on hand   Mycophenolate 250 mg: 5 days of medicine on hand     REFERRAL TO PHARMACIST     Referral to the pharmacist: Not needed      [facility]     Shipping address confirmed in Epic.     Delivery Scheduled: Yes, Expected medication delivery date: 10/15/2020.     Medication will be delivered via Same Day Courier to the prescription address in Epic [facility].    Brighid Koch D Deidre Carino   [facility] Specialty Technician

## 2020-10-13 NOTE — Unmapped (Signed)
Pt has a follow up 8/3

## 2020-10-14 ENCOUNTER — Ambulatory Visit: Admit: 2020-10-14 | Payer: MEDICARE | Attending: Internal Medicine | Primary: Internal Medicine

## 2020-10-14 NOTE — Unmapped (Unsigned)
Referring Provider: [name], MD     PCP: [name], MD    10/13/2020    Chief Complaint: Chronic Kidney Disease    HPI: [name] is a/an 70 y.o. 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 In review of the patient's medical record, the patient's creatinine was 1.3 in July 2018 and found to be 2.0 in August 2020. He was diagnosed with 2017 during a hospital admission, but likely had this for some time. He does have a history of retinopathy or neuropathy. He denies a family history of kidney disease and denies a personal history of frequent urinary tract infections or excessive NSAID. Does report a remote history of nephrolithiasis.    Today, he feels well and denies any recent illnesses or hospitalizations. He was experiencing swallowing issues 3 weeks ago that self resolved. Otherwise, he denies fevers, chills, headaches, chest pain, nausea, vomiting, abdominal pain, dysuria, hematuria, ulcers, syncope or seizures. His lower extremity swelling is quite mild. He follows with [facility] and his last A1c was 6.2%. Most recent weight is 191lbs, does not check his blood pressure at home.       ROS: 11 systems reviewed and negative except those noted in the history of present illness    PAST MEDICAL HISTORY:  Past Medical History:   Diagnosis Date   ??? Atrial fibrillation (CMS-HCC)    ??? Basal cell carcinoma    ??? Cancer (CMS-HCC)    ??? Cirrhosis (CMS-HCC)    ??? Depression    ??? Diabetes (CMS-HCC)    ??? Headache    ??? Hepatitis C 07/17/2012   ??? History of transfusion    ??? Liver disease    ??? Low back pain 07/17/2012   ??? Varices, esophageal (CMS-HCC)        ALLERGIES  Patient has no known allergies.    SOCIAL HISTORY  Social History     Socioeconomic History   ??? Marital status: Married Tobacco Use   ??? Smoking status: Never Smoker   ??? Smokeless tobacco: Never Used   ??? Tobacco comment: Smoked in high school for about 5 years.    Substance and Sexual Activity   ??? Alcohol use: No   ??? Drug use: No   ??? Sexual activity: Yes     Partners: Female     Birth control/protection: None   Other Topics Concern   ??? Exercise No   ??? Living Situation No         FAMILY HISTORY  Family History   Problem Relation Age of Onset   ??? Hypertension Mother    ??? Cancer Mother    ??? Heart disease Mother    ??? Heart disease Father    ??? Cirrhosis Neg Hx    ??? Liver cancer Neg Hx    ??? Anemia Neg Hx    ??? Diabetes Neg Hx    ??? Kidney disease Neg Hx    ??? Obesity Neg Hx    ??? Thyroid disease Neg Hx    ??? Osteoporosis Neg Hx    ??? Coronary artery disease Neg Hx    ??? Anesthesia problems Neg Hx    ??? Basal cell carcinoma Neg Hx    ??? Squamous cell carcinoma Neg Hx  MEDICATIONS:  Current Outpatient Medications   Medication Sig Dispense Refill   ??? alendronate (FOSAMAX) 70 MG tablet Take 1 tablet (70 mg total) by mouth every seven (7) days. 12 tablet 3   ??? bisacodyl (DULCOLAX, BISACODYL,) 5 mg EC tablet Take 5 mg by mouth daily as needed.      ??? blood sugar diagnostic Strp Use three (3) times a day before meals. 100 each 7   ??? blood-glucose meter (GLUCOSE MONITORING KIT) kit Use as instructed 1 each 0   ??? chlorthalidone (HYGROTON) 25 MG tablet Take 1/2 tablet (12.5 mg total) by mouth every morning. 30 tablet 0   ??? cholecalciferol, vitamin D3, 125 mcg (5,000 unit) tablet Take 125 mcg by mouth daily.     ??? cream base no.171, bulk, Crea Apply to affected area as needed up to 4 times daily 60 g 0   ??? cycloSPORINE modified (NEORAL) 100 MG capsule Take 2 capsules (200 mg total) in the morning AND 1 capsule (with 3 x 25mg  capsules) nightly. For a total dose of 200 mg in the morning , 175mg  every evening. 270 capsule 3   ??? cycloSPORINE modified (NEORAL) 25 MG capsule Take 3 capsules (75 mg total) by mouth nightly (with a 100mg  capsule). For a total dose of 200 mg in the morning , 175mg  every evening 270 capsule 3   ??? DULoxetine (CYMBALTA) 30 MG capsule Take 2 capsules (60 mg total) by mouth nightly. 60 capsule 11   ??? magnesium oxide-Mg AA chelate (MAGNESIUM, AMINO ACID CHELATE,) 133 mg Tab Take 2 tablets by mouth Two (2) times a day. 120 tablet PRN   ??? melatonin 3 mg Tab Take 1 tablet (3 mg total) by mouth nightly. 30 tablet 0   ??? mirtazapine (REMERON) 15 MG tablet Take 1 tablet (15 mg total) by mouth nightly. 90 tablet 3   ??? mycophenolate (CELLCEPT) 250 mg capsule Take 2 capsules (500 mg total) by mouth two (2) times a day. 120 capsule 7   ??? pen needle, diabetic (ULTICARE PEN NEEDLE) 32 gauge x 5/32 (4 mm) Ndle USE WITH INSULIN PEN 4 TIMES DAILY AS DIRECTED 100 each 1   ??? predniSONE (DELTASONE) 5 MG tablet Take 4 tablets (20 mg) by mouth for one day, then take 3 tablets (15 mg) the next day, then take 2 tablets (10 mg) the next day, then take 1 tablet (5mg ) then stop 10 tablet 0   ??? pregabalin (LYRICA) 100 MG capsule Take 1 capsule (100 mg total) by mouth 2 (two) times daily 60 capsule 11   ??? pregabalin (LYRICA) 75 MG capsule Take 1 capsule (75 mg total) by mouth Two (2) times a day. 60 capsule 6   ??? rosuvastatin (CRESTOR) 5 MG tablet Take 1 tablet (5 mg total) by mouth daily. 90 tablet 1   ??? semaglutide (OZEMPIC) 1 mg/dose (4 mg/3 mL) PnIj injection Inject 1 mg under the skin every seven (7) days. 9 mL 3   ??? sildenafiL, pulm.hypertension, (REVATIO) 20 mg tablet Take 2 tablets (40 mg total) by mouth Three (3) times a day. 180 tablet 2   ??? sodium bicarbonate 650 mg tablet Take 1 tablet (650 mg total) by mouth two (2) times a day. 180 tablet 3   ??? sodium polystyrene, SPS, with sorbitol (SPS, WITH SORBITOL,) 15-20 gram/60 mL Susp Take 10mL by mouth once for one dose 10 mL 0     No current facility-administered medications for this visit.  PHYSICAL EXAM:     There were no vitals filed for this visit.  CONSTITUTIONAL: Alert, no distress  PULM: No respiratory distress    The remainder of the examination was deferred secondary to the fact that this was a phone/video visit    MEDICAL DECISION MAKING  Results for orders placed or performed in visit on 05/18/20   POCT Glucose   Result Value Ref Range    Glucose, POC 320 (H) 70 - 179 mg/dL     *Note: Due to a large number of results and/or encounters for the requested time period, some results have not been displayed. A complete set of results can be found in Results Review.        Creatinine Whole Blood, POC   Date Value Ref Range Status   09/25/2015 1.0 0.8 - 1.4 mg/dL Final   16/12/9602 0.9 0.8 - 1.4 mg/dL Final     Creatinine   Date Value Ref Range Status   04/27/2020 2.21 (H) 0.60 - 1.10 mg/dL Final   54/11/8117 1.47 (H) 0.60 - 1.10 mg/dL Final   82/95/6213 0.86 (H) 0.70 - 1.30 mg/dL Final   57/84/6962 9.52 (H) 0.60 - 1.10 mg/dL Final   84/13/2440 1.02 (H) 0.70 - 1.30 mg/dL Final   72/53/6644 0.34 0.70 - 1.30 mg/dL Final   74/25/9563 8.75 0.70 - 1.30 mg/dL Final   64/33/2951 8.84 0.70 - 1.30 mg/dL Final   16/60/6301 6.01 0.70 - 1.30 mg/dL Final   09/32/3557 3.22 0.70 - 1.30 mg/dL Final     EST.GFR (MDRD)   Date Value Ref Range Status   02/15/2012 >= 60 >=60 mL/min/1.74m2 Final   08/17/2011 >= 60 >=60 mL/min/1.64m2 Final   04/20/2011 >= 60 >=60 mL/min/1.71m2 Final   11/10/2010 >= 60 >=60 mL/min/1.59m2 Final   07/21/2010 >= 60 >=60 mL/min/1.74m2 Final      BUN   Date Value Ref Range Status   04/27/2020 38 (H) 9 - 23 mg/dL Final   02/54/2706 31 (H) 9 - 23 mg/dL Final   23/76/2831 35 (H) 9 - 23 mg/dL Final   51/76/1607 50 (H) 9 - 23 mg/dL Final   37/12/6267 42 (H) 7 - 21 mg/dL Final   48/54/6270 34 (H) mg/dL Final   35/00/9381 41 (H) mg/dL Final   82/99/3716 30 (H) mg/dL Final   96/78/9381 12 7 - 21 mg/dL Final   01/75/1025 14 7 - 21 mg/dL Final      Phosphorus   Date Value Ref Range Status   04/27/2020 2.9 2.4 - 5.1 mg/dL Final   85/27/7824 2.8 2.4 - 5.1 mg/dL Final   23/53/6144 2.8 2.4 - 5.1 mg/dL Final   31/54/0086 2.4 2.4 - 5.1 mg/dL Final   76/19/5093 3.0 2.9 - 4.7 mg/dL Final      Calcium   Date Value Ref Range Status   04/27/2020 10.2 8.7 - 10.4 mg/dL Final   26/71/2458 09.9 8.7 - 10.4 mg/dL Final   83/38/2505 39.7 8.7 - 10.4 mg/dL Final   67/34/1937 90.2 8.7 - 10.4 mg/dL Final   40/97/3532 99.2 8.5 - 10.2 mg/dL Final   42/68/3419 9.3 8.5 - 10.2 mg/dL Final   62/22/9798 9.6 8.5 - 10.2 mg/dL Final   92/01/9416 9.5 8.5 - 10.2 mg/dL Final   40/81/4481 8.6 8.5 - 10.2 MG/DL Final   85/63/1497 8.7 8.5 - 10.2 MG/DL Final      PTH   Date Value Ref Range Status   06/14/2019 95.0 (H) 12.0 - 72.0 pg/mL Final  HGB   Date Value Ref Range Status   04/27/2020 12.7 (L) 13.5 - 17.5 g/dL Final   29/56/2130 86.5 (L) 13.5 - 17.5 g/dL Final   78/46/9629 52.8 (L) 13.5 - 17.5 g/dL Final   41/32/4401 02.7 (L) 13.5 - 17.5 g/dL Final   25/36/6440 34.7 (L) 13.5 - 17.5 g/dL Final     Hemoglobin   Date Value Ref Range Status   09/26/2016 6.2 (L) 13.5 - 17.5 g/dL Final     Comment:     Point of Care Testing performed at the point of care by trained personnel per documented policies.   09/18/2016 9.7 (L) 13.5 - 17.5 g/dL Final     Comment:     Point of Care Testing performed at the point of care by trained personnel per documented policies.   09/18/2016 9.6 (L) 13.5 - 17.5 g/dL Final     Comment:     Point of Care Testing performed at the point of care by trained personnel per documented policies.   09/15/2016 9.8 (L) 13.5 - 17.5 g/dL Final     Comment:     Point of Care Testing performed at the point of care by trained personnel per documented policies.   09/15/2016 9.1 (L) 13.5 - 17.5 g/dL Final     Comment:     Point of Care Testing performed at the point of care by trained personnel per documented policies.      CO2   Date Value Ref Range Status   04/27/2020 23.1 20.0 - 31.0 mmol/L Final   04/06/2020 22.0 20.0 - 31.0 mmol/L Final   01/20/2020 23.7 20.0 - 31.0 mmol/L Final   11/19/2019 21.0 20.0 - 31.0 mmol/L Final   06/21/2019 21.0 (L) 22.0 - 30.0 mmol/L Final   10/10/2013 29 22 - 30 mmol/L Final   04/03/2013 28 22 - 30 mmol/L Final   07/25/2012 30 22 - 30 mmol/L Final   05/13/2010 30 22 - 30 MMOL/L Final         I have reviewed relevant outside healthcare records    Urine Sediment: Bland sediment, no formed cellular elements    IMAGING STUDIES:   04/2018 MR Abdomen with and without contrast:  LOWER CHEST:   The visualized portions of the lower chest are unremarkable.  ABDOMEN/PELVIS:  HEPATOBILIARY:  Patient is status-post liver transplantation. Several peripherally enhancing lesions are again seen within hepatic segments II and VIII, which are stable-to-slightly decreased in size. For reference:  - Slightly decreased size of largest hepatic segment VIII lesion (13:24) which measures 1.7 x 2.1 cm (transverse by AP). This lesion previously measured 2.3 x 2.1 cm.  No new focal hepatic lesions are identified. Gallbladder surgically absent. Mild intrahepatic biliary ductal dilatation is noted, which is improved from prior exam and thought secondary to postcholecystectomy state. No obstructing stone or mass lesion is identified.  PANCREAS: Pancreas is atrophic. Multiple nonenhancing cystic lesions are seen scattered throughout the pancreatic head and body, similar to prior. No new focal mass. No ductal dilatation.  SPLEEN: No focal parenchymal abnormality.  ADRENAL GLANDS: Unremarkable.  KIDNEYS/URETERS: Symmetric nephrograms. No enhancing renal mass. No hydronephrosis.  BOWEL/PERITONEUM/RETROPERITONEUM: No bowel obstruction. No acute inflammatory process. No free fluid.  VASCULATURE: Abdominal aorta within normal limits for patient's age. Unremarkable IVC. The portal, splenic, and superior mesenteric veins are patent.   LYMPH NODES: Previously described enlarged porta hepatis lymph node is no longer well-visualized. No new or enlarging lymphadenopathy is identified within the abdomen.  BONES/SOFT  TISSUES: Posterior spinal fixation hardware with associated artifact somewhat limits evaluation of the adjacent osseous structures and soft tissues.   Gynecomastia. No soft tissue abnormality. No worrisome osseous lesion identified.  IMPRESSION:  - Unchanged to slightly decreased size of hepatic abscesses in segments 2 and 8. No new hepatic lesions identified.  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    ASSESSMENT/PLAN:  [name] is a/an 70 y.o. 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   1. Chronic Kidney Disease Stage III: The patient's renal dysfunction is most likely secondary to diabetic nephropathy and prior fluid shifts in the post-transplant requiring CRRT to manage pulmonary hypertension/fluid overload. His creatinine has ranged from 1.6-2 over the past year, most recently 2.01 from 06/21/19. His creatinine had an uptick to 2.3 from 06/14/19, this may have been due to mild dehydration while he was having swallowing issues. No recent changes in medications. Potassium and bicarbonate acceptable, continue sodium bicarbonate supplementation at 650mg  BID. Historically, he has not had any significant proteinuria or hematuria. MRI abdomen from October 2020 revealed symmetric nephrograms, no mass or hydronephrosis noted. The patient was counseled on avoiding nephrotoxic agents including but not limited to NSAIDs and contrasted studies.    2. Type 2 Diabetes: Most recent A1c 6.2%, continues to follow with [facility].    3. S/p Liver Transplant: Continues on cyclosporine, cellcept, and prednisone. Reports adherence to his immunosuppressive regimen. Last cyclosporine level 53.   4. Hypertension: Have asked patient to check his home blood pressures at least 1x/week and to report these values to me if they are consistently >140 systolic. Had added low low dose Chlorthalidone 12.5mg  daily at his last visit. Has mild edema currently, can adjust diuretic if needed going forward based on volume status.     [name] will follow up in 6 months     I personally spent *** minutes face-to-face and non-face-to-face in the care of this patient, which includes all pre, intra, and post visit time on the date of service.

## 2020-10-15 MED ORDER — CHLORTHALIDONE 25 MG TABLET
ORAL_TABLET | Freq: Every morning | ORAL | 0 refills | 60 days | Status: CP
Start: 2020-10-15 — End: 2021-10-15
  Filled 2020-10-15: qty 30, 60d supply, fill #0

## 2020-10-15 MED FILL — CYCLOSPORINE MODIFIED 25 MG CAPSULE: ORAL | 30 days supply | Qty: 90 | Fill #5

## 2020-10-15 MED FILL — DULOXETINE 30 MG CAPSULE,DELAYED RELEASE: ORAL | 30 days supply | Qty: 60 | Fill #2

## 2020-10-15 MED FILL — SILDENAFIL (PULMONARY HYPERTENSION) 20 MG TABLET: ORAL | 30 days supply | Qty: 180 | Fill #0

## 2020-10-15 MED FILL — MYCOPHENOLATE MOFETIL 250 MG CAPSULE: ORAL | 30 days supply | Qty: 120 | Fill #5

## 2020-10-15 MED FILL — PREGABALIN 100 MG CAPSULE: 30 days supply | Qty: 60 | Fill #1

## 2020-10-15 MED FILL — CYCLOSPORINE MODIFIED 100 MG CAPSULE: ORAL | 30 days supply | Qty: 90 | Fill #4

## 2020-10-15 MED FILL — ROSUVASTATIN 5 MG TABLET: ORAL | 90 days supply | Qty: 90 | Fill #1

## 2020-10-15 MED FILL — MG-PLUS-PROTEIN 133 MG TABLET: ORAL | 30 days supply | Qty: 120 | Fill #6

## 2020-10-28 ENCOUNTER — Encounter: Payer: Self-pay | Admitting: *Deleted

## 2020-10-28 ENCOUNTER — Ambulatory Visit: Payer: PRIVATE HEALTH INSURANCE | Admitting: Gastroenterology

## 2020-11-05 MED ORDER — BLOOD SUGAR DIAGNOSTIC STRIPS
Freq: Three times a day (TID) | 7 refills | 0.00000 days | Status: CP
Start: 2020-11-05 — End: 2021-11-05

## 2020-11-05 MED ORDER — MIRTAZAPINE 15 MG TABLET
ORAL_TABLET | Freq: Every evening | ORAL | 3 refills | 90 days | Status: CN
Start: 2020-11-05 — End: 2021-11-05

## 2020-11-05 NOTE — Unmapped (Signed)
[facility] Refill Coordination Note    Specialty Medication(s) to be Shipped:   Transplant: mycophenolate mofetil 250mg , cyclosporine 25mg  and cyclosporine 100mg     Other medication(s) to be shipped: pregabalin,rosuvastatin, duloxetine, magnesium and alendronate     Jeffrey Ward, DOB: January 19, 1951  Phone: [phone] (home)       All above HIPAA information was verified with patient.     Was a Nurse, learning disability used for this call? No    Completed refill call assessment today to schedule patient's medication shipment from the [facility] [phone]).  All relevant notes have been reviewed.     Specialty medication(s) and dose(s) confirmed: Regimen is correct and unchanged.   Changes to medications: Travious reports no changes at this time.  Changes to insurance: No  New side effects reported not previously addressed with a pharmacist or physician: None reported  Questions for the pharmacist: No    Confirmed patient received a Conservation officer, historic buildings and a Surveyor, mining with first shipment. The patient will receive a drug information handout for each medication shipped and additional FDA Medication Guides as required.       DISEASE/MEDICATION-SPECIFIC INFORMATION        N/A    SPECIALTY MEDICATION ADHERENCE     Medication Adherence    Patient reported X missed doses in the last month: 0  Specialty Medication: Cyclosporine 25mg   Patient is on additional specialty medications: Yes  Additional Specialty Medications: Cyclosporine 100mg   Patient Reported Additional Medication X Missed Doses in the Last Month: 0  Patient is on more than two specialty medications: Yes  Specialty Medication: Mycophenolate 250mg   Patient Reported Additional Medication X Missed Doses in the Last Month: 0  Support network for adherence: family member        Were doses missed due to medication being on hold? No    Cyclosporine 25 mg: 11 days of medicine on hand   Cyclosporine 100 mg: 11 days of medicine on hand Mycophenolate 250 mg: 11 days of medicine on hand     REFERRAL TO PHARMACIST     Referral to the pharmacist: Not needed      SHIPPING     Shipping address confirmed in Epic.     Delivery Scheduled: Yes, Expected medication delivery date: 11/13/2020.     Medication will be delivered via Next Day Courier to the prescription address in Epic WAM.    Jeffrey Ward   [facility] Specialty Technician

## 2020-11-05 NOTE — Unmapped (Signed)
Mirtazapine expired, prescriber no longer with this practice. Please reach out to primary care provider for a new prescription.

## 2020-11-05 NOTE — Unmapped (Signed)
Patient has requested a medication refill via EPIC

## 2020-11-12 MED FILL — SILDENAFIL (PULMONARY HYPERTENSION) 20 MG TABLET: ORAL | 30 days supply | Qty: 180 | Fill #1

## 2020-11-12 MED FILL — CYCLOSPORINE MODIFIED 25 MG CAPSULE: ORAL | 30 days supply | Qty: 90 | Fill #6

## 2020-11-12 MED FILL — PREGABALIN 100 MG CAPSULE: 30 days supply | Qty: 60 | Fill #2

## 2020-11-12 MED FILL — DULOXETINE 30 MG CAPSULE,DELAYED RELEASE: ORAL | 30 days supply | Qty: 60 | Fill #3

## 2020-11-12 MED FILL — MG-PLUS-PROTEIN 133 MG TABLET: ORAL | 30 days supply | Qty: 120 | Fill #7

## 2020-11-12 MED FILL — MYCOPHENOLATE MOFETIL 250 MG CAPSULE: ORAL | 30 days supply | Qty: 120 | Fill #6

## 2020-11-12 MED FILL — ALENDRONATE 70 MG TABLET: ORAL | 84 days supply | Qty: 12 | Fill #1

## 2020-11-12 MED FILL — CYCLOSPORINE MODIFIED 100 MG CAPSULE: ORAL | 30 days supply | Qty: 90 | Fill #5

## 2020-11-23 MED ORDER — PEN NEEDLE, DIABETIC 32 GAUGE X 5/32" (4 MM)
1 refills | 0.00000 days
Start: 2020-11-23 — End: 2021-11-23

## 2020-11-23 NOTE — Unmapped (Signed)
Pt request for RX Refillpen needle, diabetic (SURE COMFORT PEN NEEDLE) 32 gauge x 5/32 (4 mm) Ndle

## 2020-11-24 MED ORDER — ONETOUCH ULTRA TEST STRIPS
0 refills | 0 days
Start: 2020-11-24 — End: 2020-11-24

## 2020-11-25 MED ORDER — PEN NEEDLE, DIABETIC 32 GAUGE X 5/32" (4 MM)
1 refills | 0 days
Start: 2020-11-25 — End: 2021-11-25

## 2020-11-26 MED ORDER — PEN NEEDLE, DIABETIC 32 GAUGE X 5/32" (4 MM)
Freq: Four times a day (QID) | 1 refills | 0.00000 days
Start: 2020-11-26 — End: 2021-11-25

## 2020-11-27 MED FILL — ULTICARE PEN NEEDLE 32 GAUGE X 5/32" (4 MM): 25 days supply | Qty: 100 | Fill #0

## 2020-11-30 ENCOUNTER — Ambulatory Visit: Admit: 2020-11-30 | Payer: MEDICARE

## 2020-11-30 ENCOUNTER — Institutional Professional Consult (permissible substitution): Admit: 2020-11-30 | Discharge: 2020-12-01 | Payer: MEDICARE

## 2020-11-30 DIAGNOSIS — I499 Cardiac arrhythmia, unspecified: Principal | ICD-10-CM

## 2020-11-30 NOTE — Unmapped (Signed)
[facility]  Cardiac Device Clinic  Tel: [phone]  Fax: [phone]    Cardiac Implanted Electronic Device Evaluation-In Person Outpatient Visit      Manufacturer of Device: Medtronic     Model: Micra VR TCP [id number]  Type of Device: Single Chamber Pacemaker  DOI: 10/11/2016    Battery: >8 years estimated longevity    Mode: VVIR  Lower rate limit: 50 bpm    Presenting rhythm:  VS  Underlying rhythm: SR   Dependent: No    Pacing Percentages  AP:   RVP: 5.5  LVP:   Bi-VP:     Diagnostics/Episodes: Last Interrogation Note: Remote on 08/27/2020  ?? No recorded event episodes     Fluid Monitoring  ?? Not Applicable     Reprogramming  Changes This Session Session Start Current Value  Mode VVIR VVI  Lower Rate 50 bpm 40 bpm  Rate Hysteresis 30 bpm      Please see scanned and / or downloaded PDF file in this patient's chart for this encounter for full details of device interrogation and reprogramming.

## 2020-11-30 NOTE — Unmapped (Signed)
Remote requested

## 2020-11-30 NOTE — Unmapped (Signed)
Referring Provider: [name], MD  [address]  [facility] [place],  [place] [address]  Phone: [phone]  Fax: [phone]     Primary Provider: [name], MD  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  His amiodarone was stopped on previous follow-up and he has remained in sinus rhythm since.    Today he presents for device check.  The Micra leadless pacemaker is functioning normally and pacing burden is 5.5% although I suspect this is because rate responsive pacing is turned on.  No clinical recurrence of atrial fibrillation since his amiodarone was stopped in January 2022    PLAN  1. Ziopatch for subclinical AF  2. Reprogram device from VVIR 50-120/min, to VVI 40/min  3. Routine annual device clinic follow-up    We will follow up in 12 months.  I've requested [name] to contact me if has AF recurrence in the interim.      All questions were answered for the patient.  Thank you very much for the referral.  Please contact me with any questions at telephone number [phone]. Entered by Daiva Huge on behalf of Dr. Kathi Ludwig, on 11/30/2020, 3:31 PM    Documentation assistance was provided by the scribe. I was present during the time the encounter was recorded. The information recorded by the scribe was done at my direction and has been reviewed and validated by me. The documentation recorded by the scribe accurately reflects the service I personally performed and the decisions made by me.     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 His amiodarone was discontinued 12/2016 and he has remained in sinus rhythm. [name] presents today for a device check. No complaints.  No clinical AF recurrence since amiodarone was stopped at the last clinic visit in January 2022.    HPI 04/13/20:  Device interrogation in clinic today was normal, with normal lead parameters and device function (see scanned in report).  There have been no significant arrhythmia.    Past Medical and Surgical History:     Past Medical History:   Diagnosis Date   ??? Atrial fibrillation (CMS-HCC)    ??? Basal cell carcinoma    ???  Cancer (CMS-HCC)    ??? Cirrhosis (CMS-HCC)    ??? Depression    ??? Diabetes (CMS-HCC)    ??? Headache    ??? Hepatitis C 07/17/2012   ??? History of transfusion    ??? Liver disease    ??? Low back pain 07/17/2012   ??? Varices, esophageal (CMS-HCC)        Past Surgical History:   Procedure Laterality Date   ??? ANKLE SURGERY     ??? BACK SURGERY     ??? BACK SURGERY     ??? CARDIAC SURGERY      pacemaker   ??? CHG US GUIDE, TISSUE ABLATION N/A 01/22/2016 Procedure: ULTRASOUND GUIDANCE FOR, AND MONITORING OF, PARENCHYMAL TISSUE ABLATION;  Surgeon: Particia Nearing, MD;  Location: MAIN OR [facility];  Service: Transplant   ??? IR EMBOLIZATION ORGAN ISCHEMIA, TUMORS, INFAR  06/16/2016    IR EMBOLIZATION ORGAN ISCHEMIA, TUMORS, INFAR 06/16/2016 Ammie Dalton, MD IMG VIR H&V [facility]   ??? PR COLON CA SCRN NOT HI RSK IND N/A 02/27/2015    Procedure: COLOREC CNCR SCR;COLNSCPY NO;  Surgeon: Vonda Antigua, MD;  Location: GI [facility]URES [facility];  Service: Gastroenterology   ??? PR ENDOSCOPIC ULTRASOUND EXAM N/A 02/27/2015    Procedure: UGI ENDO; W/ENDO ULTRASOUND EXAM INCLUDES ESOPHAGUS, STOMACH, &/OR DUODENUM/JEJUNUM;  Surgeon: Vonda Antigua, MD;  Location: GI PROC[facility]URES [facility];  Service: Gastroenterology   ??? PR ERCP BALLOON DILATE BILIARY/PANC DUCT/AMPULLA EA N/A 02/10/2017    Procedure: ERCP;WITH TRANS-ENDOSCOPIC BALLOON DILATION OF BILIARY/PANCREATIC DUCT(S) OR OF AMPULLA, INCLUDING SPHINCTERECTOMY, WHEN PERFOREMD,EACH DUCT ([address]109);  Surgeon: Mayford Knife, MD;  Location: GI PROCEDURES [facility];  Service: Gastroenterology   ??? PR ERCP REMOVE FOREIGN BODY/STENT BILIARY/PANC DUCT N/A 02/10/2017    Procedure: ENDOSCOPIC RETROGRADE CHOLANGIOPANCREATOGRAPHY (ERCP); W/ REMOVAL OF FOREIGN BODY/STENT FROM BILIARY/PANCREATIC DUCT(S);  Surgeon: Mayford Knife, MD;  Location: GI PROCEDURES [facility];  Service: Gastroenterology   ??? PR ERCP REMOVE FOREIGN BODY/STENT BILIARY/PANC DUCT N/A 06/29/2017    Procedure: ENDOSCOPIC RETROGRADE CHOLANGIOPANCREATOGRAPHY (ERCP); W/ REMOVAL OF FOREIGN BODY/STENT FROM BILIARY/PANCREATIC DUCT(S);  Surgeon: Vonda Antigua, MD;  Location: GI PROCEDURES [facility];  Service: Gastroenterology   ??? PR ERCP STENT PLACEMENT BILIARY/PANCREATIC DUCT N/A 11/29/2016    Procedure: ENDOSCOPIC RETROGRADE CHOLANGIOPANCREATOGRAPHY (ERCP); WITH PLACEMENT OF ENDOSCOPIC STENT INTO BILIARY OR PANCREATIC DUCT;  Surgeon: Chriss Driver, MD; Location: GI PROCEDURES [facility];  Service: Gastroenterology   ??? PR ERCP STENT PLACEMENT BILIARY/PANCREATIC DUCT N/A 04/26/2017    Procedure: ENDOSCOPIC RETROGRADE CHOLANGIOPANCREATOGRAPHY (ERCP); WITH PLACEMENT OF ENDOSCOPIC STENT INTO BILIARY OR PANCREATIC DUCT;  Surgeon: Mayford Knife, MD;  Location: GI PROCEDURES MEMOR[facility];  Service: Gastroenterology   ??? PR ERCP,W/REMOVAL STONE,BIL/PANCR DUCTS N/A 11/29/2016    Procedure: ERCP; W/ENDOSCOPIC RETROGRADE REMOVAL OF CALCULUS/CALCULI FROM BILIARY &/OR PANCREATIC DUCTS;  Surgeon: Chriss Driver, MD;  Location: GI PROCEDURES [facility];  Service: Gastroenterology   ??? PR ERCP,W/REMOVAL STONE,BIL/PANCR DUCTS N/A 02/10/2017    Procedure: ERCP; W/ENDOSCOPIC RETROGRADE REMOVAL OF CALCULUS/CALCULI FROM BILIARY &/OR PANCREATIC DUCTS;  Surgeon: Mayford Knife, MD;  Location: GI PROCEDURES [facility];  Service: Gastroenterology   ??? PR ERCP,W/REMOVAL STONE,BIL/PANCR DUCTS N/A 04/26/2017    Procedure: ERCP; W/ENDOSCOPIC RETROGRADE REMOVAL OF CALCULUS/CALCULI FROM BILIARY &/OR PANCREATIC DUCTS;  Surgeon: Mayford Knife, MD;  Location: GI PROCEDURES [facility];  Service: Gastroenterology   ??? PR ERCP,W/REMOVAL STONE,BIL/PANCR DUCTS N/A 06/29/2017    Procedure: ERCP; W/ENDOSCOPIC RETROGRADE REMOVAL OF CALCULUS/CALCULI FROM BILIARY &/OR PANCREATIC  DUCTS;  Surgeon: Vonda Antigua, MD;  Location: GI PROC[facility]URES [facility];  Service: Gastroenterology   ??? PR ESOPHAGOSCP RIG TRANSORAL HYPOPHARYNX CRV ESOPH N/A 04/04/2018    Procedure: ESOPHAGOSCOPY, RIGID, TRANSORAL WITH DIVERTICULECTOMY OF HYPOPHARYNX OR CERVICAL ESOPHAGUS, WITH CRICOPHARYNGEAL MYOTOMY, INCLUDES USE OF TELESCOPE OR OPERATING MICROSCOPE AND REPAIR, WHEN PERFORMED;  Surgeon: Vista Deck, MD;  Location: [facility]IN OR [facility];  Service: ENT   ??? PR INSER HEART TEMP PACER ONE CHMBR N/A 10/02/2016    Procedure: Tempoarary Pacemaker Insertion;  Surgeon: Meredith Leeds, MD;  Location: [facility]; Service: Cardiology   ??? PR LAP,DIAGNOSTIC ABDOMEN N/A 01/22/2016    Procedure: Laparoscopy, Abdomen, Peritoneum, & Omentum, Diagnostic, W/Wo Collection Specimen(S) By Brushing Or Washing;  Surgeon: Particia Nearing, MD;  Location: MAIN OR [facility];  Service: Transplant   ??? PR LARYNGOPLASTY MEDIALIZATION UNLIATERAL N/A 11/07/2018    Procedure: R31 LARYNGOPLASTY, MEDIALIZATION, UNILATERAL;  Surgeon: Vista Deck, MD;  Location: MAIN OR [facility];  Service: ENT   ??? PR PLACE PERCUT GASTROSTOMY TUBE N/A 11/17/2016    Procedure: UGI ENDO; W/DIRECTED PLCMT PERQ GASTROSTOMY TUBE;  Surgeon: Cletis Athens, MD;  Location: GI PROCEDURES MEMORIAL [place];  Service: Gastroenterology   ??? PR TRACHEOSTOMY, PLANNED N/A 09/29/2016    Procedure: TRACHEOSTOMY PLANNED (SEPART PROC);  Surgeon: Katherina Mires, MD;  Location: MAIN OR [facility];  Service: Trauma   ??? PR TRANSCATH INSERT OR REPLACE LEADLESS PM VENTR N/A 10/11/2016    Procedure: Pacemaker Implant/Replace Leadless;  Surgeon: Meredith Leeds, MD;  Location: [facility] EP;  Service: Cardiology   ??? PR TRANSPLANT LIVER,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    Procedure: Liver Allotransplantation; Orthotopic, Partial Or Whole, From Cadaver Or Living Donor, Any Age;  Surgeon: Doyce Loose, MD;  Location: MAIN OR [facility];  Service: Transplant   ??? PR TRANSPLANT,PREP DONOR LIVER, WHOLE N/A 09/15/2016    Procedure: Rogelia Boga Std Prep Cad Donor Whole Liver Gft Prior Tnsplnt,Inc Chole,Diss/Rem Surr Tissu Wo Triseg/Lobe Splt;  Surgeon: Doyce Loose, MD;  Location: MAIN OR [facility];  Service: Transplant   ??? PR UPPER GI ENDOSCOPY,BIOPSY N/A 07/17/2012    Procedure: UGI ENDOSCOPY; WITH BIOPSY, SINGLE OR MULTIPLE;  Surgeon: Alba Destine, MD;  Location: GI PROCEDURES [facility];  Service: Gastroenterology   ??? PR UPPER GI ENDOSCOPY,DIAGNOSIS N/A 02/04/2014    Procedure: UGI ENDO, INCLUDE ESOPHAGUS, STOMACH, & DUODENUM &/OR JEJUNUM; DX W/WO COLLECTION SPECIMN, BY BRUSH OR WASH;  Surgeon: Wilburt Finlay, MD;  Location: GI PROC[facility]URES [facility];  Service: Gastroenterology   ??? PR UPPER GI ENDOSCOPY,LIGAT VARIX N/A 11/05/2013    Procedure: UGI ENDO; W/BAND LIG ESOPH &/OR GASTRIC VARICES;  Surgeon: Wilburt Finlay, MD;  Location: GI PROCEDURES MEMORIAL [facility];  Service: Gastroenterology       Medications and Allergies:     Current Outpatient Medications   Medication Sig Dispense Refill   ??? alendronate (FOSAMAX) 70 MG tablet Take 1 tablet (70 mg total) by mouth every seven (7) days. 12 tablet 3   ??? bisacodyL (DULCOLAX) 5 mg EC tablet Take 5 mg by mouth daily as needed.      ??? chlorthalidone (HYGROTON) 25 MG tablet Take 1/2 tablet (12.5 mg total) by mouth every morning. 30 tablet 0   ??? cholecalciferol, vitamin D3, 125 mcg (5,000 unit) tablet Take 125 mcg by mouth daily.     ??? cream base no.171, bulk, Crea Apply to affected area as needed up to 4 times daily 60 g 0   ???  cycloSPORINE modified (NEORAL) 100 MG capsule Take 2 capsules (200 mg total) in the morning AND 1 capsule (with 3 x 25mg  capsules) nightly. For a total dose of 200 mg in the morning , 175mg  every evening. 270 capsule 3   ??? cycloSPORINE modified (NEORAL) 25 MG capsule Take 3 capsules (75 mg total) by mouth nightly (with a 100mg  capsule). For a total dose of 200 mg in the morning , 175mg  every evening 270 capsule 3   ??? DULoxetine (CYMBALTA) 30 MG capsule Take 2 capsules (60 mg total) by mouth nightly. 60 capsule 11   ??? magnesium oxide-Mg AA chelate (MAGNESIUM, AMINO ACID CHELATE,) 133 mg Tab Take 2 tablets by mouth Two (2) times a day. 120 tablet PRN   ??? melatonin 3 mg Tab Take 1 tablet (3 mg total) by mouth nightly. 30 tablet 0   ??? predniSONE (DELTASONE) 5 MG tablet Take 4 tablets (20 mg) by mouth for one day, then take 3 tablets (15 mg) the next day, then take 2 tablets (10 mg) the next day, then take 1 tablet (5mg ) then stop 10 tablet 0   ??? pregabalin (LYRICA) 100 MG capsule Take 1 capsule (100 mg total) by mouth 2 (two) times daily 60 capsule 11 ??? pregabalin (LYRICA) 75 MG capsule Take 1 capsule (75 mg total) by mouth Two (2) times a day. 60 capsule 6   ??? rosuvastatin (CRESTOR) 5 MG tablet Take 1 tablet (5 mg total) by mouth daily. 90 tablet 1   ??? semaglutide (OZEMPIC) 1 mg/dose (4 mg/3 mL) PnIj injection Inject 1 mg under the skin every seven (7) days. 9 mL 3   ??? sildenafiL, pulm.hypertension, (REVATIO) 20 mg tablet Take 2 tablets (40 mg total) by mouth Three (3) times a day. 180 tablet 2   ??? sodium bicarbonate 650 mg tablet Take 1 tablet (650 mg total) by mouth two (2) times a day. 180 tablet 3   ??? sodium polystyrene, SPS, with sorbitol (SPS, WITH SORBITOL,) 15-20 gram/60 mL Susp Take 10mL by mouth once for one dose 10 mL 0   ??? blood sugar diagnostic Strp Use three (3) times a day before meals. 100 each 7   ??? blood-glucose meter (GLUCOSE MONITORING KIT) kit Use as instructed 1 each 0   ??? mirtazapine (REMERON) 15 MG tablet Take 1 tablet (15 mg total) by mouth nightly. 90 tablet 3   ??? mycophenolate (CELLCEPT) 250 mg capsule Take 2 capsules (500 mg total) by mouth two (2) times a day. 120 capsule 7   ??? pen needle, diabetic (ULTICARE PEN NEEDLE) 32 gauge x 5/32 (4 mm) Ndle USE WITH INSULIN PEN 4 TIMES DAILY AS DIRECTED 100 each 1   ??? pen needle, diabetic 32 gauge x 5/32 (4 mm) Ndle Use as directed four (4) times a day. 100 each 1     No current facility-administered medications for this visit.       Allergies  Patient has no known allergies.    Family History:     The patient's family history includes Cancer in his mother; Heart disease in his father and mother; Hypertension in his mother.    Social History:     Social History     Socioeconomic History   ??? Marital status: Married   Tobacco Use   ??? Smoking status: Never Smoker   ??? Smokeless tobacco: Never Used   ??? Tobacco comment: Smoked in high school for about 5 years.    Substance and Sexual Activity   ???  Alcohol use: No   ??? Drug use: No   ??? Sexual activity: Yes     Partners: Female     Birth control/protection: None   Other Topics Concern   ??? Exercise No   ??? Living Situation No       Review of Systems:     All 10 systems reviewed and negative unless otherwise states in the HPI      Physical Examination:     Vitals:    11/30/20 1459   BP: 121/54   Pulse: 71   SpO2: 99%     Height: 175.3 cm (5' 9)  Wt Readings from Last 3 Encounters:   11/30/20 74.8 kg (165 lb)   07/31/20 70.8 kg (156 lb)   05/22/20 82.6 kg (182 lb)        Body mass index is 24.37 kg/m??.    General: well nourished, appears comfortable on examination couch.  Mental: alert, orientated and appropriate.  Thorax: ventilation comfortable.   Cardiovascular: regular rhythm, perfusion normal.   Neurological: no focal neurological deficit.  Extremities: no edema.      Investigations:     Lab Review   Lab Results   Component Value Date    WBC 5.0 04/27/2020    HGB 12.7 (L) 04/27/2020    HCT 39.0 04/27/2020    MCV 90.3 04/27/2020    PLT 79 (L) 04/27/2020     Lab Results   Component Value Date    BUN 38 (H) 04/27/2020    CO2 23.1 04/27/2020    CREATININE 2.21 (H) 04/27/2020    K 5.2 (H) 04/27/2020    NA 139 04/27/2020    CL 110 (H) 04/27/2020    CALCIUM 10.2 04/27/2020       Lab Results   Component Value Date    TROPONINI <0.034 04/05/2018       Lab Results   Component Value Date    MG 1.6 04/27/2020       Lab Results   Component Value Date    PHOS 2.9 04/27/2020     Lab Results   Component Value Date    PT 11.6 11/05/2018    INR 1.01 11/05/2018     No results found for: BNP  Lab Results   Component Value Date    ALT 9 (L) 04/27/2020    AST 13 04/27/2020    GGT 14 04/27/2020    ALKPHOS 83 04/27/2020    BILITOT 0.6 04/27/2020     Lab Results   Component Value Date    TRIG 101 12/26/2016    HDL 70 (H) 12/26/2016    LDL 122 (H) 12/26/2016       Lab Results   Component Value Date    TSH 3.833 04/27/2020       Cardiac Investigations:    12-lead ECG (personal review):  NSR 58/minute, early repolarization    EP Procedures and Devices:  ?? 09/2016 Medtronic Micra pacemaker  ??  Non-Invasive Evaluation(s):  Echocardiogram:  TTE 12/10/19:  1. Technically difficult study due to chest wall/lung interference.    2. The left ventricular systolic function is normal, LVEF is visually  estimated at 55-60%.    3. The aortic valve is probably trileaflet with mildly thickened leaflets  with normal excursion.    4. The right ventricle is normal in size, with normal systolic function.    TTE 04/2017  ?? Technically difficult study due to chest wall/lung interference  ?? Normal left ventricular systolic  function, ejection fraction 65 to 70%  ?? Diastolic dysfunction - grade I (normal filling pressures)  ?? Dilated left atrium  ?? Normal right ventricular systolic function

## 2020-12-01 ENCOUNTER — Ambulatory Visit: Admit: 2020-12-01 | Discharge: 2020-12-02 | Payer: MEDICARE

## 2020-12-01 DIAGNOSIS — I2721 Secondary pulmonary arterial hypertension: Principal | ICD-10-CM

## 2020-12-01 DIAGNOSIS — K766 Portal hypertension: Principal | ICD-10-CM

## 2020-12-01 DIAGNOSIS — Z944 Liver transplant status: Principal | ICD-10-CM

## 2020-12-01 DIAGNOSIS — R0600 Dyspnea, unspecified: Principal | ICD-10-CM

## 2020-12-01 LAB — BASIC METABOLIC PANEL
ANION GAP: 8 mmol/L (ref 5–14)
BLOOD UREA NITROGEN: 38 mg/dL — ABNORMAL HIGH (ref 9–23)
BUN / CREAT RATIO: 16
CALCIUM: 9.7 mg/dL (ref 8.7–10.4)
CHLORIDE: 105 mmol/L (ref 98–107)
CO2: 25.1 mmol/L (ref 20.0–31.0)
CREATININE: 2.33 mg/dL — ABNORMAL HIGH
EGFR CKD-EPI (2021) MALE: 29 mL/min/{1.73_m2} — ABNORMAL LOW (ref >=60)
GLUCOSE RANDOM: 256 mg/dL — ABNORMAL HIGH (ref 70–179)
POTASSIUM: 4.9 mmol/L — ABNORMAL HIGH (ref 3.4–4.8)
SODIUM: 138 mmol/L (ref 135–145)

## 2020-12-01 NOTE — Unmapped (Signed)
Thank you for visiting the [facility].    If you have any questions or concerns, please call     H. James Brandan Robicheaux, MD    Laura Nowicki, RN  Sara Decker, RN    Pulmonary Hypertension nurse coordinators:    [phone]

## 2020-12-01 NOTE — Unmapped (Signed)
[facility]   Pulmonary Hypertension Program                                                     H. Sherre Lain, MD--Director (Pulmonary)     Denice Bors. Tresa Res, MD--Associate Director (Pulmonary)    Freeman Caldron, MD (Cardiology)     Bonney Leitz, MD (Pulmonary)    Marva Panda, RN Nurse Coordinator     Claretha Cooper, RN Nurse Coordinator    [phone] office - [phone] fax          Mr. Leven Hoel  is a 70 y.o. male  patient referred by [name], MD for work up and evaluation for pulmonary hypertension.    PAH Treatment History:  Sildenafil 40 mg tid since post liver txplt hospitalization.    Pt describes a history of recent liver txplt in 09/2016.  Post op was noted to have previously unrecognized PAH and RV failure, treated initially with inhaled iloprost while on vent support and then transitioned to sildenafil.  He had a long post op course requiring a stay in rehab of about 2 weeks prior to discharge.   His post op course was further complicated by AKI (on CRRT then had renal recovery), afib, bradycardia requiring pacemaker placement.  CTA negative for PE post op.      No LE edema currently.      He continues on sildenafil 40 tid and is receiving from [facility] pharmacy.      He is no longer on warfarin.    No syncope or near syncope.      Denies dyspnea, syncope or near syncope.      Had recent hip fx s/p surgery end of Sept 2019.  Currently undergoing rehab.      Has been having URI symptoms last two day.  Antibx was called in by his PCP.  No fevers.      INTERVAL HPI 11/19/19:    Patient reports doing well.  No changes since last visit aside from feeling a little more dyspneic with activities.  He has gained weight and thinks may be related to that.    No syncope or near syncope.  No LE edema noted.    Continues on sildenafil.      Patient refuses to get COVID vaccine.      INTERVAL HPI 07/31/20:    Patient continues to feel weakness and some dypsnea, but not changed compared to prior visit.    No LE edema noted.  No chest pain.     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    No syncope or near syncope.     INTERVAL HPI 12/01/20:    Patient here for follow up. Continues with same level of dyspnea he has had since liver tsplt.      No LE edema.     Denies syncope.      Continues on sildenafil for POPH.    Is overdue for echo--states he was not called to schedule after it was ordered at last visit.        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  The patient's current NYHA functional class is Class 2 - comfortable at rest but have symptoms with ordinary physcial activity.     Will get follow up echo and check pro-BNP.     Overall he appears stable with no signs of RV failure clinically.      Plan:          1) continue sildenafil 40 mg tid.      2) next visit in 4 months    3) previously recommended COVID vaccine.  Declines this and flu vaccine today      4) pro BNP and BMP today    5) echo in coming weeks.      Subjective:      Past medical history is significant for:   Past Medical History:   Diagnosis Date   ??? Atrial fibrillation (CMS-HCC)    ??? Basal cell carcinoma    ??? Cancer (CMS-HCC)    ??? Cirrhosis (CMS-HCC)    ??? Depression    ??? Diabetes (CMS-HCC)    ??? Headache    ??? Hepatitis C 07/17/2012   ??? History of transfusion    ??? Liver disease    ??? Low back pain 07/17/2012   ??? Varices, esophageal (CMS-HCC)       Family History   Problem Relation Age of Onset   ??? Hypertension Mother    ??? Cancer Mother    ??? Heart disease Mother    ??? Heart disease Father    ??? Cirrhosis Neg Hx    ??? Liver cancer Neg Hx    ??? Anemia Neg Hx    ??? Diabetes Neg Hx    ??? Kidney disease Neg Hx    ??? Obesity Neg Hx    ??? Thyroid disease Neg Hx    ??? Osteoporosis Neg Hx    ??? Coronary artery disease Neg Hx    ??? Anesthesia problems Neg Hx    ??? Basal cell carcinoma Neg Hx    ??? Squamous cell carcinoma Neg Hx       Social History     Socioeconomic History   ??? Marital status: Married   Tobacco Use   ??? Smoking status: Never Smoker   ??? Smokeless tobacco: Never Used   ??? Tobacco comment: Smoked in high school for about 5 years.    Substance and Sexual Activity   ??? Alcohol use: No   ??? Drug use: No   ??? Sexual activity: Yes     Partners: Female     Birth control/protection: None   Other Topics Concern   ??? Exercise No   ??? Living Situation No      Past Surgical History:   Procedure Laterality Date   ??? ANKLE SURGERY     ??? BACK SURGERY     ??? BACK SURGERY     ??? CARDIAC SURGERY      pacemaker   ??? CHG US GUIDE, TISSUE ABLATION N/A 01/22/2016    Procedure: ULTRASOUND GUIDANCE FOR, AND MONITORING OF, PARENCHYMAL TISSUE ABLATION;  Surgeon: Particia Nearing, MD;  Location: MAIN OR [facility];  Service: Transplant   ??? IR EMBOLIZATION ORGAN ISCHEMIA, TUMORS, INFAR  06/16/2016    IR EMBOLIZATION ORGAN ISCHEMIA, TUMORS, INFAR 06/16/2016 Ammie Dalton, MD IMG VIR H&V [facility]   ??? PR COLON CA SCRN NOT HI RSK IND N/A 02/27/2015    Procedure: COLOREC CNCR SCR;COLNSCPY NO;  Surgeon: Vonda Antigua, MD;  Location: GI PROCEDURES MEMOR[facility];  Service: Gastroenterology   ???  PR ENDOSCOPIC ULTRASOUND EXAM N/A 02/27/2015    Procedure: UGI ENDO; W/ENDO ULTRASOUND EXAM INCLUDES ESOPHAGUS, STOMACH, &/OR DUODENUM/JEJUNUM;  Surgeon: Vonda Antigua, MD;  Location: GI PROCEDURES MEMORIAL [facility];  Service: Gastroenterology   ??? PR ERCP BALLOON DILATE BILIARY/PANC DUCT/AMPULLA EA N/A 02/10/2017    Procedure: ERCP;WITH TRANS-ENDOSCOPIC BALLOON DILATION OF BILIARY/PANCREATIC DUCT(S) OR OF AMPULLA, INCLUDING SPHINCTERECTOMY, WHEN PERFOREMD,EACH DUCT (16109);  Surgeon: Mayford Knife, MD;  Location: GI PROCEDURES [facility]ORIAL [facility];  Service: Gastroenterology   ??? PR ERCP REMOVE FOREIGN BODY/STENT BILIARY/PANC DUCT N/A 02/10/2017    Procedure: ENDOSCOPIC RETROGRADE CHOLANGIOPANCREATOGRAPHY (ERCP); W/ REMOVAL OF FOREIGN BODY/STENT FROM BILIARY/PANCREATIC DUCT(S);  Surgeon: Mayford Knife, MD;  Location: GI PROCEDURES MEMORIAL [facility];  Service: Gastroenterology   ??? PR ERCP REMOVE FOREIGN BODY/STENT BILIARY/PANC DUCT N/A 06/29/2017    Procedure: ENDOSCOPIC RETROGRADE CHOLANGIOPANCREATOGRAPHY (ERCP); W/ REMOVAL OF FOREIGN BODY/STENT FROM BILIARY/PANCREATIC DUCT(S);  Surgeon: Vonda Antigua, MD;  Location: GI PROCEDURES [facility]OR[facility];  Service: Gastroenterology   ??? PR ERCP STENT PLACEMENT BILIARY/PANCREATIC DUCT N/A 11/29/2016    Procedure: ENDOSCOPIC RETROGRADE CHOLANGIOPANCREATOGRAPHY (ERCP); WITH PLACEMENT OF ENDOSCOPIC STENT INTO BILIARY OR PANCREATIC DUCT;  Surgeon: Chriss Driver, MD;  Location: GI PROCEDURES [facility]OR[facility];  Service: Gastroenterology   ??? PR ERCP STENT PLACEMENT BILIARY/PANCREATIC DUCT N/A 04/26/2017    Procedure: ENDOSCOPIC RETROGRADE CHOLANGIOPANCREATOGRAPHY (ERCP); WITH PLACEMENT OF ENDOSCOPIC STENT INTO BILIARY OR PANCREATIC DUCT;  Surgeon: Mayford Knife, MD;  Location: GI PROCEDURES MEMORIAL [facility];  Service: Gastroenterology   ??? PR ERCP,W/REMOVAL STONE,BIL/PANCR DUCTS N/A 11/29/2016    Procedure: ERCP; W/ENDOSCOPIC RETROGRADE REMOVAL OF CALCULUS/CALCULI FROM BILIARY &/OR PANCREATIC DUCTS;  Surgeon: Chriss Driver, MD;  Location: GI PROCEDURES MEMORIAL [facility];  Service: Gastroenterology   ??? PR ERCP,W/REMOVAL STONE,BIL/PANCR DUCTS N/A 02/10/2017    Procedure: ERCP; W/ENDOSCOPIC RETROGRADE REMOVAL OF CALCULUS/CALCULI FROM BILIARY &/OR PANCREATIC DUCTS;  Surgeon: Mayford Knife, MD;  Location: GI PROCEDURES [facility];  Service: Gastroenterology   ??? PR ERCP,W/REMOVAL STONE,BIL/PANCR DUCTS N/A 04/26/2017    Procedure: ERCP; W/ENDOSCOPIC RETROGRADE REMOVAL OF CALCULUS/CALCULI FROM BILIARY &/OR PANCREATIC DUCTS;  Surgeon: Mayford Knife, MD;  Location: GI PROCEDURES MEMORIAL [facility];  Service: Gastroenterology   ??? PR ERCP,W/REMOVAL STONE,BIL/PANCR DUCTS N/A 06/29/2017    Procedure: ERCP; W/ENDOSCOPIC RETROGRADE REMOVAL OF CALCULUS/CALCULI FROM BILIARY &/OR PANCREATIC DUCTS;  Surgeon: Vonda Antigua, MD;  Location: GI PROCEDURES MEMORIAL [facility];  Service: Gastroenterology   ??? PR ESOPHAGOSCP RIG TRANSORAL HYPOPHARYNX CRV ESOPH N/A 04/04/2018    Procedure: ESOPHAGOSCOPY, RIGID, TRANSORAL WITH DIVERTICULECTOMY OF HYPOPHARYNX OR CERVICAL ESOPHAGUS, WITH CRICOPHARYNGEAL MYOTOMY, INCLUDES USE OF TELESCOPE OR OPERATING MICROSCOPE AND REPAIR, WHEN PERFORMED;  Surgeon: Vista Deck, MD;  Location: MAIN OR [facility];  Service: ENT   ??? PR INSER HEART TEMP PACER ONE CHMBR N/A 10/02/2016    Procedure: Tempoarary Pacemaker Insertion;  Surgeon: Meredith Leeds, MD;  Location: [facility] EP;  Service: Cardiology   ??? PR LAP,DIAGNOSTIC ABDOMEN N/A 01/22/2016    Procedure: Laparoscopy, Abdomen, Peritoneum, & Omentum, Diagnostic, W/Wo Collection Specimen(S) By Brushing Or Washing;  Surgeon: Particia Nearing, MD;  Location: MAIN OR [facility];  Service: Transplant   ??? PR LARYNGOPLASTY MEDIALIZATION UNLIATERAL N/A 11/07/2018    Procedure: R31 LARYNGOPLASTY, MEDIALIZATION, UNILATERAL;  Surgeon: Vista Deck, MD;  Location: MAIN OR [facility];  Service: ENT   ??? PR PLACE PERCUT GASTROSTOMY TUBE N/A 11/17/2016    Procedure: UGI ENDO; W/DIRECTED PLCMT PERQ GASTROSTOMY TUBE;  Surgeon: Cletis Athens, MD;  Location: GI PROCEDURES MEMORIAL  [facility];  Service: Gastroenterology   ??? PR TRACHEOSTOMY, PLANNED N/A 09/29/2016    Procedure: TRACHEOSTOMY PLANNED (SEPART PROC);  Surgeon: Katherina Mires, MD;  Location: MAIN OR [facility];  Service: Trauma   ??? PR TRANSCATH INSERT OR REPLACE LEADLESS PM VENTR N/A 10/11/2016    Procedure: Pacemaker Implant/Replace Leadless;  Surgeon: Meredith Leeds, MD;  Location: [facility] EP;  Service: Cardiology   ??? PR TRANSPLANT LIVER,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    Procedure: Liver Allotransplantation; Orthotopic, Partial Or Whole, From Cadaver Or Living Donor, Any Age;  Surgeon: Doyce Loose, MD;  Location: MAIN OR [facility]; Service: Transplant   ??? PR TRANSPLANT,PREP DONOR LIVER, WHOLE N/A 09/15/2016    Procedure: Rogelia Boga Std Prep Cad Donor Whole Liver Gft Prior Tnsplnt,Inc Chole,Diss/Rem Surr Tissu Wo Triseg/Lobe Splt;  Surgeon: Doyce Loose, MD;  Location: MAIN OR [facility];  Service: Transplant   ??? PR UPPER GI ENDOSCOPY,BIOPSY N/A 07/17/2012    Procedure: UGI ENDOSCOPY; WITH BIOPSY, SINGLE OR MULTIPLE;  Surgeon: Alba Destine, MD;  Location: GI PROCEDURES MEMOR[facility];  Service: Gastroenterology   ??? PR UPPER GI ENDOSCOPY,DIAGNOSIS N/A 02/04/2014    Procedure: UGI ENDO, INCLUDE ESOPHAGUS, STOMACH, & DUODENUM &/OR JEJUNUM; DX W/WO COLLECTION SPECIMN, BY BRUSH OR WASH;  Surgeon: Wilburt Finlay, MD;  Location: GI PROCEDURES MEMORIAL [facility];  Service: Gastroenterology   ??? PR UPPER GI ENDOSCOPY,LIGAT VARIX N/A 11/05/2013    Procedure: UGI ENDO; W/BAND LIG ESOPH &/OR GASTRIC VARICES;  Surgeon: Wilburt Finlay, MD;  Location: GI PROCEDURES [facility];  Service: Gastroenterology        Objective:   Objective   Review of systems is significant for:     As stated in the HPI, remainder of 12 system review is negative.      No Known Allergies   Current Outpatient Medications   Medication Sig Dispense Refill   ??? alendronate (FOSAMAX) 70 MG tablet Take 1 tablet (70 mg total) by mouth every seven (7) days. 12 tablet 3   ??? bisacodyL (DULCOLAX) 5 mg EC tablet Take 5 mg by mouth daily as needed.      ??? blood sugar diagnostic Strp Use three (3) times a day before meals. 100 each 7   ??? blood-glucose meter (GLUCOSE MONITORING KIT) kit Use as instructed 1 each 0   ??? chlorthalidone (HYGROTON) 25 MG tablet Take 1/2 tablet (12.5 mg total) by mouth every morning. 30 tablet 0   ??? cholecalciferol, vitamin D3, 125 mcg (5,000 unit) tablet Take 125 mcg by mouth daily.     ??? cream base no.171, bulk, Crea Apply to affected area as needed up to 4 times daily 60 g 0   ??? cycloSPORINE modified (NEORAL) 100 MG capsule Take 2 capsules (200 mg total) in the morning AND 1 capsule (with 3 x 25mg  capsules) nightly. For a total dose of 200 mg in the morning , 175mg  every evening. 270 capsule 3   ??? cycloSPORINE modified (NEORAL) 25 MG capsule Take 3 capsules (75 mg total) by mouth nightly (with a 100mg  capsule). For a total dose of 200 mg in the morning , 175mg  every evening 270 capsule 3   ??? DULoxetine (CYMBALTA) 30 MG capsule Take 2 capsules (60 mg total) by mouth nightly. 60 capsule 11   ??? magnesium oxide-Mg AA chelate (MAGNESIUM, AMINO ACID CHELATE,) 133 mg Tab Take 2 tablets by mouth Two (2) times a day. 120 tablet PRN   ??? melatonin 3 mg Tab Take 1 tablet (3 mg  total) by mouth nightly. 30 tablet 0   ??? mirtazapine (REMERON) 15 MG tablet Take 1 tablet (15 mg total) by mouth nightly. 90 tablet 3   ??? mycophenolate (CELLCEPT) 250 mg capsule Take 2 capsules (500 mg total) by mouth two (2) times a day. 120 capsule 7   ??? pen needle, diabetic (ULTICARE PEN NEEDLE) 32 gauge x 5/32 (4 mm) Ndle USE WITH INSULIN PEN 4 TIMES DAILY AS DIRECTED 100 each 1   ??? pen needle, diabetic 32 gauge x 5/32 (4 mm) Ndle Use as directed four (4) times a day. 100 each 1   ??? predniSONE (DELTASONE) 5 MG tablet Take 4 tablets (20 mg) by mouth for one day, then take 3 tablets (15 mg) the next day, then take 2 tablets (10 mg) the next day, then take 1 tablet (5mg ) then stop 10 tablet 0   ??? pregabalin (LYRICA) 100 MG capsule Take 1 capsule (100 mg total) by mouth 2 (two) times daily 60 capsule 11   ??? pregabalin (LYRICA) 75 MG capsule Take 1 capsule (75 mg total) by mouth Two (2) times a day. 60 capsule 6   ??? rosuvastatin (CRESTOR) 5 MG tablet Take 1 tablet (5 mg total) by mouth daily. 90 tablet 1   ??? semaglutide (OZEMPIC) 1 mg/dose (4 mg/3 mL) PnIj injection Inject 1 mg under the skin every seven (7) days. 9 mL 3   ??? sildenafiL, pulm.hypertension, (REVATIO) 20 mg tablet Take 2 tablets (40 mg total) by mouth Three (3) times a day. 180 tablet 2   ??? sodium bicarbonate 650 mg tablet Take 1 tablet (650 mg total) by mouth two (2) times a day. 180 tablet 3   ??? sodium polystyrene, SPS, with sorbitol (SPS, WITH SORBITOL,) 15-20 gram/60 mL Susp Take 10mL by mouth once for one dose 10 mL 0     No current facility-administered medications for this visit.        Physical Exam:    weight is 75 kg (165 lb 6.4 oz). His temporal temperature is 36.7 ??C (98.1 ??F). His blood pressure is 139/68 and his pulse is 64. His respiration is 18 and oxygen saturation is 97%.   General Appearance:    Alert, cooperative, no distress, appears stated age   Head:    Normocephalic, without obvious abnormality, atraumatic   Eyes:    PERRL, conjunctiva clear, EOM's intact    Oropharynx:   No lesions   Neck:   Supple, trachea midline, no adenopathy;    No JVD   Lungs:     Good breath sounds b/l, no crackles or wheezes, respirations unlabored   Chest Wall:    No tenderness or deformity    Heart:    Regular rate and rhythm, S1 and S2 normal, no murmur, rub   or gallop   Abdomen:     Soft, non-tender, normal bowel sounds, no masses, no organomegaly.  + G tube in place   Extremities:   Extremities normal, no cyanosis, clubbing, or edema   Pulses:   2+ and symmetric all extremities   Skin:   No rashes or lesions   Lymph nodes:   No cervical or supraclavicular adenopathy   Neurologic:   No focal neurological deficits       Diagnostic Review:    Echo 04/21/17:    Left Ventricle The left ventricle is normal in size with normal wall thickness.   The left ventricular systolic function is normal.   The ejection fraction is  visually estimated at 65 to 70%.   There is impaired left ventricular relaxation with normal left ventricular filling pressure consistent with grade I diastolic dysfunction.   Mitral Valve The mitral valve leaflets are normal, with normal leaflet mobility.   There is no significant mitral regurgitation by color and continuous wave Doppler imaging.   Left Atrium The left atrium is dilated.   Aortic Valve The aortic valve is poorly visualized with probably normal excursion.   There is no significant aortic regurgitation by color and continuous wave Doppler imaging.  There is no evidence of a significant transvalvular gradient.   - Peak transvalvular velocity: 1 m/sec   Ascending Aorta The aorta is normal in size in the visualized segments.   Pulmonary Artery The pulmonary artery is not well visualized.   Pulmonic Valve The pulmonic valve is poorly visualized, but probably normal.   There is no significant pulmonic regurgitation by color and continuous wave Doppler imaging.   There is no evidence of a significant transvalvular gradient.   - Peak transvalvular velocity: 0.9 m/sec   Right Ventricle The right ventricle is normal in size with normal wall thickness.   The right ventricular systolic function is normal.   Tricuspid Valve The tricuspid valve leaflets are normal, with normal leaflet mobility.   There is trivial tricuspid regurgitation by color and continuous wave Doppler imaging.   - The Doppler signal is suboptimal and pulmonary artery systolic pressure cannot be accurately estimated.   Right Atrium The right atrium is normal in size.   IVC/SVC The IVC diameter is <=21 mm with >50% decrease in size with inspiration suggesting normal right atrial pressure (0-5 mm Hg).   Pericardium There is no evidence of a significant pericardial effusion.         V/Q scan 01/11/17:    Impression     Normal ventilation and perfusion scan.         CT scan PE protocol performed on 09/2016 post op revealed:    Impression     No CT evidence for acute pulmonary embolism, as clinically questioned.    Moderate bilateral pleural effusions similar to prior CT dated 01/30/2016 with slightly increased atelectasis of the adjacent lung parenchyma in the bilateral lower lobes, left greater than right.    Addendum (Dr.Altun dictating 09/27/2016 08:35 AM) Focal contour bulging of the liver adjacent to the diaphragm and right kidney is noted which is not well evaluated due to the presence of streak artifacts.          Pulmonary Function Test performed on 11/24/15 show:            Consistent with normal spirometry and normal DLCO    6 Minute Walk Test performed on 01/06/17:         Echo performed on 09/27/16:    Echocardiographic Findings     Left Ventricle The left ventricle is normal in size with normal wall thickness.   The left ventricular systolic function is normal.   The ejection fraction is visually estimated at 60 to 65%.   Left ventricular diastolic function cannot be accurately assessed.      Left Atrium The left atrium is normal in size.      Right Ventricle The right ventricle is moderately dilated.   The right ventricular systolic function is decreased.      Tricuspid Valve The tricuspid valve is not well visualized.   There is mild tricuspid regurgitation by color and continuous wave Doppler imaging.  The regurgitation jet is directed toward the septum.   The pulmonary artery systolic pressure is severely elevated.   - Peak tricuspid regurgitant velocity: 4.6 m/sec  - Estimated pulmonary artery systolic pressure: 80 + RA pressure mmHg      Right Atrium The right atrium is poorly visualized but appears dilated      IVC/SVC Mechanical ventilation precludes the ability to accurately assess right atrial pressure.      Pericardium There is a small anterior pericardial effusion.          [facility] Catheterization performed on date 09/22/16:     Wedge pressure initially looks to be 15-16 but with further balloon deflation this clearly decreases further to 8 mmHg with very good quality tracing. At that time mPAP 50, CVP 9, CO by CCO measurements 4.1/CI 2.2, PVR approximating 10 WU.

## 2020-12-02 LAB — PRO-BNP: PRO-BNP: 359 pg/mL — ABNORMAL HIGH (ref 0.0–125.0)

## 2020-12-04 DIAGNOSIS — Z944 Liver transplant status: Principal | ICD-10-CM

## 2020-12-04 DIAGNOSIS — Z79899 Other long term (current) drug therapy: Principal | ICD-10-CM

## 2020-12-04 NOTE — Unmapped (Signed)
Called pt to check in, noted pt's recent f/up appt with cardiology. Pt stated he is doing well, does have a scheduled visit with nephro next week. Discussed annual post liver txp appt in January with new provider, pt prefers [place] clinic for convenience. Asked pt to obtain labs as last on file from February. Pt agreed, verbalized understanding.

## 2020-12-07 MED FILL — PREGABALIN 100 MG CAPSULE: 30 days supply | Qty: 60 | Fill #3

## 2020-12-08 NOTE — Unmapped (Unsigned)
Referring Provider: [name], MD     PCP: [name], MD    12/07/2020    Chief Complaint: Chronic Kidney Disease    HPI: [name] is a/an 70 y.o. 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 In review of the patient's medical record, the patient's creatinine was 1.3 in July 2018 and found to be 2.0 in August 2020. He was diagnosed with 2017 during a hospital admission, but likely had this for some time. He does have a history of retinopathy or neuropathy. He denies a family history of kidney disease and denies a personal history of frequent urinary tract infections or excessive NSAID. Does report a remote history of nephrolithiasis.    Today, he feels well and denies any recent illnesses or hospitalizations. He was experiencing swallowing issues 3 weeks ago that self resolved. Otherwise, he denies fevers, chills, headaches, chest pain, nausea, vomiting, abdominal pain, dysuria, hematuria, ulcers, syncope or seizures. His lower extremity swelling is quite mild. He follows with [facility] and his last A1c was 6.2%. Most recent weight is 191lbs, does not check his blood pressure at home.       ROS: 11 systems reviewed and negative except those noted in the history of present illness    PAST MEDICAL HISTORY:  Past Medical History:   Diagnosis Date   ??? Atrial fibrillation (CMS-HCC)    ??? Basal cell carcinoma    ??? Cancer (CMS-HCC)    ??? Cirrhosis (CMS-HCC)    ??? Depression    ??? Diabetes (CMS-HCC)    ??? Headache    ??? Hepatitis C 07/17/2012   ??? History of transfusion    ??? Liver disease    ??? Low back pain 07/17/2012   ??? Varices, esophageal (CMS-HCC)        ALLERGIES  Patient has no known allergies.    SOCIAL HISTORY  Social History     Socioeconomic History   ??? Marital status: Married Tobacco Use   ??? Smoking status: Never Smoker   ??? Smokeless tobacco: Never Used   ??? Tobacco comment: Smoked in high school for about 5 years.    Substance and Sexual Activity   ??? Alcohol use: No   ??? Drug use: No   ??? Sexual activity: Yes     Partners: Female     Birth control/protection: None   Other Topics Concern   ??? Exercise No   ??? Living Situation No         FAMILY HISTORY  Family History   Problem Relation Age of Onset   ??? Hypertension Mother    ??? Cancer Mother    ??? Heart disease Mother    ??? Heart disease Father    ??? Cirrhosis Neg Hx    ??? Liver cancer Neg Hx    ??? Anemia Neg Hx    ??? Diabetes Neg Hx    ??? Kidney disease Neg Hx    ??? Obesity Neg Hx    ??? Thyroid disease Neg Hx    ??? Osteoporosis Neg Hx    ??? Coronary artery disease Neg Hx    ??? Anesthesia problems Neg Hx    ??? Basal cell carcinoma Neg Hx    ??? Squamous cell carcinoma Neg Hx  MEDICATIONS:  Current Outpatient Medications   Medication Sig Dispense Refill   ??? alendronate (FOSAMAX) 70 MG tablet Take 1 tablet (70 mg total) by mouth every seven (7) days. 12 tablet 3   ??? bisacodyL (DULCOLAX) 5 mg EC tablet Take 5 mg by mouth daily as needed.      ??? blood sugar diagnostic Strp Use three (3) times a day before meals. 100 each 7   ??? blood-glucose meter (GLUCOSE MONITORING KIT) kit Use as instructed 1 each 0   ??? chlorthalidone (HYGROTON) 25 MG tablet Take 1/2 tablet (12.5 mg total) by mouth every morning. 30 tablet 0   ??? cholecalciferol, vitamin D3, 125 mcg (5,000 unit) tablet Take 125 mcg by mouth daily.     ??? cream base no.171, bulk, Crea Apply to affected area as needed up to 4 times daily 60 g 0   ??? cycloSPORINE modified (NEORAL) 100 MG capsule Take 2 capsules (200 mg total) in the morning AND 1 capsule (with 3 x 25mg  capsules) nightly. For a total dose of 200 mg in the morning , 175mg  every evening. 270 capsule 3   ??? cycloSPORINE modified (NEORAL) 25 MG capsule Take 3 capsules (75 mg total) by mouth nightly (with a 100mg  capsule). For a total dose of 200 mg in the morning , 175mg  every evening 270 capsule 3   ??? DULoxetine (CYMBALTA) 30 MG capsule Take 2 capsules (60 mg total) by mouth nightly. 60 capsule 11   ??? magnesium oxide-Mg AA chelate (MAGNESIUM, AMINO ACID CHELATE,) 133 mg Tab Take 2 tablets by mouth Two (2) times a day. 120 tablet PRN   ??? melatonin 3 mg Tab Take 1 tablet (3 mg total) by mouth nightly. 30 tablet 0   ??? mirtazapine (REMERON) 15 MG tablet Take 1 tablet (15 mg total) by mouth nightly. 90 tablet 3   ??? mycophenolate (CELLCEPT) 250 mg capsule Take 2 capsules (500 mg total) by mouth two (2) times a day. 120 capsule 7   ??? pen needle, diabetic (ULTICARE PEN NEEDLE) 32 gauge x 5/32 (4 mm) Ndle USE WITH INSULIN PEN 4 TIMES DAILY AS DIRECTED 100 each 1   ??? pen needle, diabetic 32 gauge x 5/32 (4 mm) Ndle Use as directed four (4) times a day. 100 each 1   ??? predniSONE (DELTASONE) 5 MG tablet Take 4 tablets (20 mg) by mouth for one day, then take 3 tablets (15 mg) the next day, then take 2 tablets (10 mg) the next day, then take 1 tablet (5mg ) then stop 10 tablet 0   ??? pregabalin (LYRICA) 100 MG capsule Take 1 capsule (100 mg total) by mouth 2 (two) times daily 60 capsule 11   ??? pregabalin (LYRICA) 75 MG capsule Take 1 capsule (75 mg total) by mouth Two (2) times a day. 60 capsule 6   ??? rosuvastatin (CRESTOR) 5 MG tablet Take 1 tablet (5 mg total) by mouth daily. 90 tablet 1   ??? semaglutide (OZEMPIC) 1 mg/dose (4 mg/3 mL) PnIj injection Inject 1 mg under the skin every seven (7) days. 9 mL 3   ??? sildenafiL, pulm.hypertension, (REVATIO) 20 mg tablet Take 2 tablets (40 mg total) by mouth Three (3) times a day. 180 tablet 2   ??? sodium bicarbonate 650 mg tablet Take 1 tablet (650 mg total) by mouth two (2) times a day. 180 tablet 3   ??? sodium polystyrene, SPS, with sorbitol (SPS, WITH SORBITOL,) 15-20 gram/60 mL Susp Take 10mL by mouth  once for one dose 10 mL 0     No current facility-administered medications for this visit.       PHYSICAL EXAM:     There were no vitals filed for this visit.   CONSTITUTIONAL: Alert, well appearing, no distress  HEENT: Moist mucous membranes, oropharynx clear without erythema or exudate  EYES: Extra ocular movements intact. Pupils reactive, sclerae anicteric.  NECK: Supple, no lymphadenopathy  CARDIOVASCULAR: Regular, normal S1/S2 heart sounds, no murmurs, no rubs.   PULM: Clear to auscultation bilaterally  GASTROINTESTINAL: Soft, active bowel sounds, nontender  EXTREMITIES: No lower extremity edema  SKIN: No rashes or lesions  NEUROLOGIC: No focal motor or sensory deficits  PSYCH: alert and oriented x 3      MEDICAL DECISION MAKING  Results for orders placed or performed in visit on 12/01/20   Pro-BNP   Result Value Ref Range    PRO-BNP 359.0 (H) 0.0 - 125.0 pg/mL   Basic Metabolic Panel   Result Value Ref Range    Sodium 138 135 - 145 mmol/L    Potassium 4.9 (H) 3.4 - 4.8 mmol/L    Chloride 105 98 - 107 mmol/L    CO2 25.1 20.0 - 31.0 mmol/L    Anion Gap 8 5 - 14 mmol/L    BUN 38 (H) 9 - 23 mg/dL    Creatinine 0.98 (H) 0.60 - 1.10 mg/dL    BUN/Creatinine Ratio 16     eGFR CKD-EPI (2021) Male 29 (L) >=60 mL/min/1.73m2    Glucose 256 (H) 70 - 179 mg/dL    Calcium 9.7 8.7 - 11.9 mg/dL     *Note: Due to a large number of results and/or encounters for the requested time period, some results have not been displayed. A complete set of results can be found in Results Review.        Creatinine Whole Blood, POC   Date Value Ref Range Status   09/25/2015 1.0 0.8 - 1.4 mg/dL Final   14/78/2956 0.9 0.8 - 1.4 mg/dL Final     Creatinine   Date Value Ref Range Status   12/01/2020 2.33 (H) 0.60 - 1.10 mg/dL Final   21/30/8657 8.46 (H) 0.60 - 1.10 mg/dL Final   96/29/5284 1.32 (H) 0.60 - 1.10 mg/dL Final   44/03/270 5.36 (H) 0.70 - 1.30 mg/dL Final   64/40/3474 2.59 (H) 0.60 - 1.10 mg/dL Final   56/38/7564 3.32 0.70 - 1.30 mg/dL Final   95/18/8416 6.06 0.70 - 1.30 mg/dL Final   30/16/0109 3.23 0.70 - 1.30 mg/dL Final   55/73/2202 5.42 0.70 - 1.30 mg/dL Final   70/62/3762 8.31 0.70 - 1.30 mg/dL Final     EST.GFR (MDRD)   Date Value Ref Range Status   02/15/2012 >= 60 >=60 mL/min/1.48m2 Final   08/17/2011 >= 60 >=60 mL/min/1.83m2 Final   04/20/2011 >= 60 >=60 mL/min/1.69m2 Final   11/10/2010 >= 60 >=60 mL/min/1.52m2 Final   07/21/2010 >= 60 >=60 mL/min/1.71m2 Final      BUN   Date Value Ref Range Status   12/01/2020 38 (H) 9 - 23 mg/dL Final   51/76/1607 38 (H) 9 - 23 mg/dL Final   37/12/6267 31 (H) 9 - 23 mg/dL Final   48/54/6270 35 (H) 9 - 23 mg/dL Final   35/00/9381 50 (H) 9 - 23 mg/dL Final   82/99/3716 34 (H) mg/dL Final   96/78/9381 41 (H) mg/dL Final   01/75/1025 30 (H) mg/dL Final   85/27/7824 12 7 - 21  mg/dL Final   95/62/1308 14 7 - 21 mg/dL Final      Phosphorus   Date Value Ref Range Status   04/27/2020 2.9 2.4 - 5.1 mg/dL Final   65/78/4696 2.8 2.4 - 5.1 mg/dL Final   29/52/8413 2.8 2.4 - 5.1 mg/dL Final   24/40/1027 2.4 2.4 - 5.1 mg/dL Final   25/36/6440 3.0 2.9 - 4.7 mg/dL Final      Calcium   Date Value Ref Range Status   12/01/2020 9.7 8.7 - 10.4 mg/dL Final   34/74/2595 63.8 8.7 - 10.4 mg/dL Final   75/64/3329 51.8 8.7 - 10.4 mg/dL Final   84/16/6063 01.6 8.7 - 10.4 mg/dL Final   03/22/3233 57.3 8.7 - 10.4 mg/dL Final   22/04/5425 9.3 8.5 - 10.2 mg/dL Final   09/04/7626 9.6 8.5 - 10.2 mg/dL Final   31/51/7616 9.5 8.5 - 10.2 mg/dL Final   07/37/1062 8.6 8.5 - 10.2 MG/DL Final   69/48/5462 8.7 8.5 - 10.2 MG/DL Final      PTH   Date Value Ref Range Status   06/14/2019 95.0 (H) 12.0 - 72.0 pg/mL Final      HGB   Date Value Ref Range Status   04/27/2020 12.7 (L) 13.5 - 17.5 g/dL Final   70/35/0093 81.8 (L) 13.5 - 17.5 g/dL Final   29/93/7169 67.8 (L) 13.5 - 17.5 g/dL Final   93/81/0175 10.2 (L) 13.5 - 17.5 g/dL Final   58/52/7782 42.3 (L) 13.5 - 17.5 g/dL Final     Hemoglobin   Date Value Ref Range Status   09/26/2016 6.2 (L) 13.5 - 17.5 g/dL Final     Comment:     Point of Care Testing performed at the point of care by trained personnel per documented policies.   09/18/2016 9.7 (L) 13.5 - 17.5 g/dL Final     Comment:     Point of Care Testing performed at the point of care by trained personnel per documented policies.   09/18/2016 9.6 (L) 13.5 - 17.5 g/dL Final     Comment:     Point of Care Testing performed at the point of care by trained personnel per documented policies.   09/15/2016 9.8 (L) 13.5 - 17.5 g/dL Final     Comment:     Point of Care Testing performed at the point of care by trained personnel per documented policies.   09/15/2016 9.1 (L) 13.5 - 17.5 g/dL Final     Comment:     Point of Care Testing performed at the point of care by trained personnel per documented policies.      CO2   Date Value Ref Range Status   12/01/2020 25.1 20.0 - 31.0 mmol/L Final   04/27/2020 23.1 20.0 - 31.0 mmol/L Final   04/06/2020 22.0 20.0 - 31.0 mmol/L Final   01/20/2020 23.7 20.0 - 31.0 mmol/L Final   11/19/2019 21.0 20.0 - 31.0 mmol/L Final   10/10/2013 29 22 - 30 mmol/L Final   04/03/2013 28 22 - 30 mmol/L Final   07/25/2012 30 22 - 30 mmol/L Final   05/13/2010 30 22 - 30 MMOL/L Final         I have reviewed relevant outside healthcare records    Urine Sediment: Bland sediment, no formed cellular elements    IMAGING STUDIES:   04/2018 MR Abdomen with and without contrast:  LOWER CHEST:   The visualized portions of the lower chest are unremarkable.  ABDOMEN/PELVIS:  HEPATOBILIARY:  Patient is  status-post liver transplantation. Several peripherally enhancing lesions are again seen within hepatic segments II and VIII, which are stable-to-slightly decreased in size. For reference:  - Slightly decreased size of largest hepatic segment VIII lesion (13:24) which measures 1.7 x 2.1 cm (transverse by AP). This lesion previously measured 2.3 x 2.1 cm.  No new focal hepatic lesions are identified. Gallbladder surgically absent. Mild intrahepatic biliary ductal dilatation is noted, which is improved from prior exam and thought secondary to postcholecystectomy state. No obstructing stone or mass lesion is identified.  PANCREAS: Pancreas is atrophic. Multiple nonenhancing cystic lesions are seen scattered throughout the pancreatic head and body, similar to prior. No new focal mass. No ductal dilatation.  SPLEEN: No focal parenchymal abnormality.  ADRENAL GLANDS: Unremarkable.  KIDNEYS/URETERS: Symmetric nephrograms. No enhancing renal mass. No hydronephrosis.  BOWEL/PERITONEUM/RETROPERITONEUM: No bowel obstruction. No acute inflammatory process. No free fluid.  VASCULATURE: Abdominal aorta within normal limits for patient's age. Unremarkable IVC. The portal, splenic, and superior mesenteric veins are patent.   LYMPH NODES: Previously described enlarged porta hepatis lymph node is no longer well-visualized. No new or enlarging lymphadenopathy is identified within the abdomen.  BONES/SOFT TISSUES: Posterior spinal fixation hardware with associated artifact somewhat limits evaluation of the adjacent osseous structures and soft tissues.   Gynecomastia. No soft tissue abnormality. No worrisome osseous lesion identified.  IMPRESSION:  - Unchanged to slightly decreased size of hepatic abscesses in segments 2 and 8. No new hepatic lesions identified.  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    ASSESSMENT/PLAN:  [name] is a/an 70 y.o. 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   1. Chronic Kidney Disease Stage III: The patient's renal dysfunction is most likely secondary to diabetic nephropathy and prior fluid shifts in the post-transplant requiring CRRT to manage pulmonary hypertension/fluid overload. His creatinine has ranged from 1.6-2 over the past year, most recently 2.01 from 06/21/19. His creatinine had an uptick to 2.3 from 06/14/19, this may have been due to mild dehydration while he was having swallowing issues. No recent changes in medications. Potassium and bicarbonate acceptable, continue sodium bicarbonate supplementation at 650mg  BID. Historically, he has not had any significant proteinuria or hematuria. MRI abdomen from October 2020 revealed symmetric nephrograms, no mass or hydronephrosis noted. The patient was counseled on avoiding nephrotoxic agents including but not limited to NSAIDs and contrasted studies.    2. Type 2 Diabetes: Most recent A1c 6.2%, continues to follow with [facility].    3. S/p Liver Transplant: Continues on cyclosporine, cellcept, and prednisone. Reports adherence to his immunosuppressive regimen. Last cyclosporine level 53.   4. Hypertension: Have asked patient to check his home blood pressures at least 1x/week and to report these values to me if they are consistently >140 systolic. Had added low low dose Chlorthalidone 12.5mg  daily at his last visit. Has mild edema currently, can adjust diuretic if needed going forward based on volume status.     Mr.Jaja Boffa will follow up in 6 months

## 2020-12-16 MED ORDER — ROSUVASTATIN 5 MG TABLET
ORAL_TABLET | Freq: Every day | ORAL | 1 refills | 90 days
Start: 2020-12-16 — End: ?

## 2020-12-16 MED ORDER — CHLORTHALIDONE 25 MG TABLET
ORAL_TABLET | Freq: Every morning | ORAL | 0 refills | 60.00000 days
Start: 2020-12-16 — End: 2021-12-16

## 2020-12-16 NOTE — Unmapped (Signed)
This patient has not been seen in over a year. Per policy we cannot refill medications.

## 2020-12-16 NOTE — Unmapped (Signed)
[facility] Refill Coordination Note    Specialty Medication(s) to be Shipped:   Transplant: mycophenolate mofetil 250mg , cyclosporine 100mg  and cyclosporine 25mg     Other medication(s) to be shipped: chlorthalidone 25 MG - DULoxetine 30 MG - magnesium (amino acid chelate) 133 mg -  rosuvastatin 5 MG - sildenafiL (pulm.hypertension) 20 mg -  sodium bicarbonate 650 mg - ULTICARE PEN NEEDLE 32 gauge x 5/32      Jeffrey Ward, DOB: [birth year]  Phone: [phone] (home)       All above HIPAA information was verified with patient.     Was a Nurse, learning disability used for this call? No    Completed refill call assessment today to schedule patient's medication shipment from the [facility] [phone]).  All relevant notes have been reviewed.     Specialty medication(s) and dose(s) confirmed: Regimen is correct and unchanged.   Changes to medications: Jeffrey Ward reports no changes at this time.  Changes to insurance: No  New side effects reported not previously addressed with a pharmacist or physician: None reported  Questions for the pharmacist: No    Confirmed patient received a Conservation officer, historic buildings and a Surveyor, mining with first shipment. The patient will receive a drug information handout for each medication shipped and additional FDA Medication Guides as required.       DISEASE/MEDICATION-SPECIFIC INFORMATION        N/A    SPECIALTY MEDICATION ADHERENCE     Medication Adherence    Patient reported X missed doses in the last month: 0  Specialty Medication: Cyclosporine 25mg   Patient is on additional specialty medications: Yes  Additional Specialty Medications: Cyclosporine 100mg   Patient Reported Additional Medication X Missed Doses in the Last Month: 0  Patient is on more than two specialty medications: Yes  Specialty Medication: Mycophenolate 250mg   Patient Reported Additional Medication X Missed Doses in the Last Month: 0  Informant: patient  Reliability of informant: reliable  Support network for adherence: family member  Confirmed plan for next specialty medication refill: delivery by pharmacy  Refills needed for supportive medications: yes, ordered or provider notified              Were doses missed due to medication being on hold? No    cycloSPORINE modified 100 MG : 7 days of medicine on hand   cycloSPORINE modified 25 MG : 7 days of medicine on hand   mycophenolate 250 mg : 7 days of medicine on hand         REFERRAL TO PHARMACIST     Referral to the pharmacist: Not needed      [facility]     Shipping address confirmed in Epic.     Delivery Scheduled: Yes, Expected medication delivery date: 12/21/20.  However, Rx request for refills was sent to the provider as there are none remaining.     Medication will be delivered via Same Day Courier to the prescription address in Epic WAM.    Jeffrey Ward   [facility] Specialty Technician

## 2020-12-17 MED ORDER — CHLORTHALIDONE 25 MG TABLET
ORAL_TABLET | Freq: Every morning | ORAL | 0 refills | 180 days | Status: CP
Start: 2020-12-17 — End: 2021-12-17
  Filled 2020-12-21: qty 45, 90d supply, fill #0

## 2020-12-17 MED ORDER — ROSUVASTATIN 5 MG TABLET
ORAL_TABLET | Freq: Every day | ORAL | 1 refills | 90.00000 days
Start: 2020-12-17 — End: ?

## 2020-12-21 MED FILL — SILDENAFIL (PULMONARY HYPERTENSION) 20 MG TABLET: ORAL | 30 days supply | Qty: 180 | Fill #2

## 2020-12-21 MED FILL — ULTICARE PEN NEEDLE 32 GAUGE X 5/32" (4 MM): 25 days supply | Qty: 100 | Fill #1

## 2020-12-21 MED FILL — MYCOPHENOLATE MOFETIL 250 MG CAPSULE: ORAL | 30 days supply | Qty: 120 | Fill #7

## 2020-12-21 MED FILL — SODIUM BICARBONATE 650 MG TABLET: ORAL | 90 days supply | Qty: 180 | Fill #3

## 2020-12-21 MED FILL — CYCLOSPORINE MODIFIED 25 MG CAPSULE: ORAL | 30 days supply | Qty: 90 | Fill #7

## 2020-12-21 MED FILL — DULOXETINE 30 MG CAPSULE,DELAYED RELEASE: ORAL | 30 days supply | Qty: 60 | Fill #4

## 2020-12-21 MED FILL — MG-PLUS-PROTEIN 133 MG TABLET: ORAL | 30 days supply | Qty: 120 | Fill #8

## 2020-12-21 MED FILL — CYCLOSPORINE MODIFIED 100 MG CAPSULE: ORAL | 30 days supply | Qty: 90 | Fill #6

## 2021-01-20 MED ORDER — BASAGLAR KWIKPEN U-100 INSULIN 100 UNIT/ML (3 ML) SUBCUTANEOUS
SUBCUTANEOUS | 4 refills | 0 days
Start: 2021-01-20 — End: ?

## 2021-01-20 MED ORDER — INSULIN GLARGINE (U-100) 100 UNIT/ML (3 ML) SUBCUTANEOUS PEN
SUBCUTANEOUS | 11 refills | 0 days
Start: 2021-01-20 — End: 2021-01-20

## 2021-01-21 DIAGNOSIS — Z944 Liver transplant status: Principal | ICD-10-CM

## 2021-01-21 DIAGNOSIS — Z79899 Other long term (current) drug therapy: Principal | ICD-10-CM

## 2021-01-21 MED ORDER — SILDENAFIL (PULMONARY HYPERTENSION) 20 MG TABLET
ORAL_TABLET | Freq: Three times a day (TID) | ORAL | 6 refills | 30 days | Status: CP
Start: 2021-01-21 — End: ?
  Filled 2021-01-25: qty 180, 30d supply, fill #0

## 2021-01-21 MED ORDER — PEN NEEDLE, DIABETIC 32 GAUGE X 5/32" (4 MM)
Freq: Four times a day (QID) | 1 refills | 25.00000 days
Start: 2021-01-21 — End: ?

## 2021-01-21 MED ORDER — MYCOPHENOLATE MOFETIL 250 MG CAPSULE
ORAL_CAPSULE | Freq: Two times a day (BID) | ORAL | 7 refills | 30.00000 days
Start: 2021-01-21 — End: 2022-01-21

## 2021-01-21 NOTE — Unmapped (Signed)
[facility] Refill Coordination Note    Specialty Medication(s) to be Shipped:   Transplant: mycophenolate mofetil 250mg , cyclosporine 25mg  and cyclosporine 100mg     Other medication(s) to be shipped: duloxetine, magnesium, pen needles, pregabalin and sildenafil     Jeffrey Ward, DOB: 1951-03-09  Phone: [phone] (home)       All above HIPAA information was verified with patient.     Was a Nurse, learning disability used for this call? No    Completed refill call assessment today to schedule patient's medication shipment from the [facility] [phone]).  All relevant notes have been reviewed.     Specialty medication(s) and dose(s) confirmed: Regimen is correct and unchanged.   Changes to medications: [place] reports no changes at this time.  Changes to insurance: No  New side effects reported not previously addressed with a pharmacist or physician: None reported  Questions for the pharmacist: No    Confirmed patient received a Conservation officer, historic buildings and a Surveyor, mining with first shipment. The patient will receive a drug information handout for each medication shipped and additional FDA Medication Guides as required.       DISEASE/MEDICATION-SPECIFIC INFORMATION        N/A    SPECIALTY MEDICATION ADHERENCE     Medication Adherence    Patient reported X missed doses in the last month: 0  Specialty Medication: mycophenolate 250 mg capsule (CELLCEPT)  Patient is on additional specialty medications: Yes  Additional Specialty Medications: cycloSPORINE modified 25 MG capsule (NeoraL)  Patient Reported Additional Medication X Missed Doses in the Last Month: 0  Patient is on more than two specialty medications: Yes  Specialty Medication: Cyclosporine 100mg   Patient Reported Additional Medication X Missed Doses in the Last Month: 0  Support network for adherence: family member        Were doses missed due to medication being on hold? No    Mycophenolate 250 mg: 7 days of medicine on hand Cyclosporine 25 mg: 7 days of medicine on hand   Cyclosporine 100 mg: 7 days of medicine on hand     REFERRAL TO PHARMACIST     Referral to the pharmacist: Not needed      [facility]     Shipping address confirmed in Epic.     Delivery Scheduled: Yes, Expected medication delivery date: 01/26/2021.     Medication will be delivered via Next Day Courier to the prescription address in Epic WAM.    Jeffrey Ward   [facility] Specialty Technician

## 2021-01-22 MED ORDER — PEN NEEDLE, DIABETIC 32 GAUGE X 5/32" (4 MM)
Freq: Four times a day (QID) | 1 refills | 25.00000 days
Start: 2021-01-22 — End: ?

## 2021-01-22 NOTE — Unmapped (Signed)
The patient is requesting a medication refill

## 2021-01-24 MED ORDER — MYCOPHENOLATE MOFETIL 250 MG CAPSULE
ORAL_CAPSULE | Freq: Two times a day (BID) | ORAL | 11 refills | 30 days | Status: CP
Start: 2021-01-24 — End: 2022-01-24
  Filled 2021-01-25: qty 120, 30d supply, fill #0

## 2021-01-25 MED FILL — CYCLOSPORINE MODIFIED 25 MG CAPSULE: ORAL | 30 days supply | Qty: 90 | Fill #8

## 2021-01-25 MED FILL — PREGABALIN 100 MG CAPSULE: 30 days supply | Qty: 60 | Fill #4

## 2021-01-25 MED FILL — MG-PLUS-PROTEIN 133 MG TABLET: ORAL | 30 days supply | Qty: 120 | Fill #9

## 2021-01-25 MED FILL — DULOXETINE 30 MG CAPSULE,DELAYED RELEASE: ORAL | 30 days supply | Qty: 60 | Fill #5

## 2021-01-25 MED FILL — ULTICARE PEN NEEDLE 32 GAUGE X 5/32" (4 MM): 25 days supply | Qty: 100 | Fill #0

## 2021-01-25 MED FILL — CYCLOSPORINE MODIFIED 100 MG CAPSULE: ORAL | 30 days supply | Qty: 90 | Fill #7

## 2021-01-28 DIAGNOSIS — R7309 Other abnormal glucose: Principal | ICD-10-CM

## 2021-01-28 MED FILL — BASAGLAR KWIKPEN U-100 INSULIN 100 UNIT/ML (3 ML) SUBCUTANEOUS: SUBCUTANEOUS | 90 days supply | Qty: 15 | Fill #0

## 2021-02-03 NOTE — Unmapped (Signed)
Received a request for Sildenafil PA to be completed via CMM. I attempted PA but received message stating patient has already received medication and Berkley Harvey is on file.

## 2021-02-09 ENCOUNTER — Ambulatory Visit: Payer: PRIVATE HEALTH INSURANCE | Admitting: Gastroenterology

## 2021-02-09 NOTE — Unmapped (Unsigned)
Referring Provider: [name], MD     PCP: [name], MD    02/09/2021    Chief Complaint: Chronic Kidney Disease    HPI: [name] is a/an 70 y.o. 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 In review of the patient's medical record, the patient's creatinine was 1.3 in July 2018 and found to be 2.0 in August 2020. He was diagnosed with 2017 during a hospital admission, but likely had this for some time. He does have a history of retinopathy or neuropathy. He denies a family history of kidney disease and denies a personal history of frequent urinary tract infections or excessive NSAID. Does report a remote history of nephrolithiasis.    Today, he feels well but has been quite busy with driving his wife for cancer treatments at [facility]. Otherwise, he denies fevers, chills, headaches, chest pain, nausea, vomiting, abdominal pain, dysuria, hematuria, ulcers, syncope or seizures. Does report weight gain over the past 6 months and mild intermittent edema in his lower extremities.       ROS: 11 systems reviewed and negative except those noted in the history of present illness    PAST MEDICAL HISTORY:  Past Medical History:   Diagnosis Date   ??? Atrial fibrillation (CMS-HCC)    ??? Basal cell carcinoma    ??? Cancer (CMS-HCC)    ??? Cirrhosis (CMS-HCC)    ??? Depression    ??? Diabetes (CMS-HCC)    ??? Headache    ??? Hepatitis C 07/17/2012   ??? History of transfusion    ??? Liver disease    ??? Low back pain 07/17/2012   ??? Varices, esophageal (CMS-HCC)        ALLERGIES  Patient has no known allergies.    SOCIAL HISTORY  Social History     Socioeconomic History   ??? Marital status: Married   Tobacco Use   ??? Smoking status: Never   ??? Smokeless tobacco: Never   ??? Tobacco comments:     Smoked in high school for about 5 years.    Substance and Sexual Activity   ??? Alcohol use: No   ??? Drug use: No   ??? Sexual activity: Yes     Partners: Female     Birth control/protection: None   Other Topics Concern   ??? Exercise No   ??? Living Situation No         FAMILY HISTORY  Family History   Problem Relation Age of Onset   ??? Hypertension Mother    ??? Cancer Mother    ??? Heart disease Mother    ??? Heart disease Father    ??? Cirrhosis Neg Hx    ??? Liver cancer Neg Hx    ??? Anemia Neg Hx    ??? Diabetes Neg Hx    ??? Kidney disease Neg Hx    ??? Obesity Neg Hx    ??? Thyroid disease Neg Hx    ??? Osteoporosis Neg Hx    ??? Coronary artery disease Neg Hx    ??? Anesthesia problems Neg Hx    ??? Basal cell carcinoma Neg Hx    ??? Squamous cell carcinoma Neg Hx         MEDICATIONS:  Current Outpatient Medications   Medication Sig Dispense  Refill   ??? alendronate (FOSAMAX) 70 MG tablet Take 1 tablet (70 mg total) by mouth every seven (7) days. 12 tablet 3   ??? BASAGLAR KWIKPEN U-100 INSULIN 100 unit/mL (3 mL) injection pen Inject 14 Units subcutaneously at bedtime 15 mL 4   ??? bisacodyL (DULCOLAX) 5 mg EC tablet Take 5 mg by mouth daily as needed.      ??? blood sugar diagnostic Strp Use three (3) times a day before meals. 100 each 7   ??? blood-glucose meter (GLUCOSE MONITORING KIT) kit Use as instructed 1 each 0   ??? chlorthalidone (HYGROTON) 25 MG tablet Take 1/2 tablet (12.5 mg total) by mouth every morning. 90 tablet 0   ??? cholecalciferol, vitamin D3, 125 mcg (5,000 unit) tablet Take 125 mcg by mouth daily.     ??? cream base no.171, bulk, Crea Apply to affected area as needed up to 4 times daily 60 g 0   ??? cycloSPORINE modified (NEORAL) 100 MG capsule Take 2 capsules (200 mg total) in the morning AND 1 capsule (with 3 x 25mg  capsules) nightly. For a total dose of 200 mg in the morning , 175mg  every evening. 270 capsule 3   ??? cycloSPORINE modified (NEORAL) 25 MG capsule Take 3 capsules (75 mg total) by mouth nightly (with a 100mg  capsule). For a total dose of 200 mg in the For a total dose of 200 mg in the morning , 175mg  every evening. 270 capsule 3   ??? cycloSPORINE modified (NEORAL) 25 MG capsule Take 3 capsules (75 mg total) by mouth nightly (with a 100mg  capsule). For a total dose of 200 mg in the morning , 175mg  every evening 270 capsule 3   ??? DULoxetine (CYMBALTA) 30 MG capsule Take 2 capsules (60 mg total) by mouth nightly. 60 capsule 11   ??? magnesium oxide-Mg AA chelate (MAGNESIUM, AMINO ACID CHELATE,) 133 mg Take 2 tablets by mouth Two (2) times a day. 120 tablet PRN   ??? melatonin 3 mg Tab Take 1 tablet (3 mg total) by mouth nightly. 30 tablet 0   ??? metoprolol succinate (TOPROL-XL) 25 MG 24 hr tablet Take 25 mg by mouth.     ??? mirtazapine (REMERON) 15 MG tablet Take 1 tablet (15 mg total) by mouth nightly. 90 tablet 3   ??? mycophenolate (CELLCEPT) 250 mg capsule Take 2 capsules (500 mg total) by mouth two (2) times a day. 120 capsule 11   ??? pantoprazole (PROTONIX) 40 MG tablet Take 1 tablet by mouth daily.     ??? pen needle, diabetic 32 gauge x 5/32 (4 mm) Ndle Use as directed four times daily 100 each 1   ??? pregabalin (LYRICA) 100 MG capsule Take 1 capsule (100 mg total) by mouth 2 (two) times daily 60 capsule 11   ??? pregabalin (LYRICA) 75 MG capsule Take 1 capsule (75 mg total) by mouth Two (2) times a day. 60 capsule 6   ??? rosuvastatin (CRESTOR) 5 MG tablet Take 1 tablet (5 mg total) by mouth daily. 90 tablet 1   ??? sildenafiL, pulm.hypertension, (REVATIO) 20 mg tablet Take 2 tablets (40 mg total) by mouth Three (3) times a day. 180 tablet 6   ??? sodium bicarbonate 650 mg tablet Take 1 tablet (650 mg total) by mouth two (2) times a day. 180 tablet 3   ??? pen needle, diabetic (ULTICARE PEN NEEDLE) 32 gauge x 5/32 (4 mm) Ndle USE WITH INSULIN PEN 4 TIMES DAILY AS DIRECTED 100 each  1   ??? predniSONE (DELTASONE) 5 MG tablet Take 4 tablets (20 mg) by mouth for one day, then take 3 tablets (15 mg) the next day, then take 2 tablets (10 mg) the next day, then take 1 tablet (5mg ) then for this visit.  CONSTITUTIONAL: Alert, no distress  HEENT: Moist mucous membranes, oropharynx clear without erythema or exudate  EYES: Extra ocular movements intact. Pupils reactive, sclerae anicteric.  NECK: Supple, no lymphadenopathy  CARDIOVASCULAR: Regular, normal S1/S2 heart sounds, no murmurs, no rubs.   PULM: Clear to auscultation bilaterally  GASTROINTESTINAL: Soft, active bowel sounds, nontender  EXTREMITIES: Trace lower extremity edema bilaterally.   SKIN: No rashes or lesions  NEUROLOGIC: Uses cane for ambulation   PSYCH: Alert and oriented x 3    MEDICAL DECISION MAKING    Results for orders placed or performed in visit on 12/01/20   Pro-BNP   Result Value Ref Range    PRO-BNP 359.0 (H) 0.0 - 125.0 pg/mL   Basic Metabolic Panel   Result Value Ref Range    Sodium 138 135 - 145 mmol/L    Potassium 4.9 (H) 3.4 - 4.8 mmol/L    Chloride 105 98 - 107 mmol/L    CO2 25.1 20.0 - 31.0 mmol/L    Anion Gap 8 5 - 14 mmol/L    BUN 38 (H) 9 - 23 mg/dL    Creatinine 1.61 (H) 0.60 - 1.10 mg/dL    BUN/Creatinine Ratio 16     eGFR CKD-EPI (2021) Male 29 (L) >=60 mL/min/1.55m2    Glucose 256 (H) 70 - 179 mg/dL    Calcium 9.7 8.7 - 09.6 mg/dL     *Note: Due to a large number of results and/or encounters for the requested time period, some results have not been displayed. A complete set of results can be found in Results Review.        Creatinine Whole Blood, POC   Date Value Ref Range Status   09/25/2015 1.0 0.8 - 1.4 mg/dL Final   04/54/0981 0.9 0.8 - 1.4 mg/dL Final     Creatinine   Date Value Ref Range Status   12/01/2020 2.33 (H) 0.60 - 1.10 mg/dL Final   19/14/7829 5.62 (H) 0.60 - 1.10 mg/dL Final   13/10/6576 4.69 (H) 0.60 - 1.10 mg/dL Final   62/95/2841 3.24 (H) 0.70 - 1.30 mg/dL Final   40/12/2723 3.66 (H) 0.60 - 1.10 mg/dL Final   44/05/4740 5.95 0.70 - 1.30 mg/dL Final   63/87/5643 3.29 0.70 - 1.30 mg/dL Final   51/88/4166 0.63 0.70 - 1.30 mg/dL Final   01/60/1093 2.35 0.70 - 1.30 mg/dL Final   57/32/2025 4.27 0.70 - 1.30 mg/dL Final     EST.GFR (MDRD)   Date Value Ref Range Status   02/15/2012 >= 60 >=60 mL/min/1.63m2 Final   08/17/2011 >= 60 >=60 mL/min/1.46m2 Final   04/20/2011 >= 60 >=60 mL/min/1.35m2 Final   11/10/2010 >= 60 >=60 mL/min/1.49m2 Final   07/21/2010 >= 60 >=60 mL/min/1.22m2 Final      BUN   Date Value Ref Range Status   12/01/2020 38 (H) 9 - 23 mg/dL Final   09/04/7626 38 (H) 9 - 23 mg/dL Final   31/51/7616 31 (H) 9 - 23 mg/dL Final   07/37/1062 35 (H) 9 - 23 mg/dL Final   69/48/5462 50 (H) 9 - 23 mg/dL Final   70/35/0093 34 (H) mg/dL Final   81/82/9937 41 (H) mg/dL Final   16/96/7893 30 (H) mg/dL Final  01/09/2014 12 7 - 21 mg/dL Final   16/12/9602 14 7 - 21 mg/dL Final      Phosphorus   Date Value Ref Range Status   04/27/2020 2.9 2.4 - 5.1 mg/dL Final   54/11/8117 2.8 2.4 - 5.1 mg/dL Final   14/78/2956 2.8 2.4 - 5.1 mg/dL Final   21/30/8657 2.4 2.4 - 5.1 mg/dL Final   84/69/6295 3.0 2.9 - 4.7 mg/dL Final      Calcium   Date Value Ref Range Status   12/01/2020 9.7 8.7 - 10.4 mg/dL Final   28/41/3244 01.0 8.7 - 10.4 mg/dL Final   27/25/3664 40.3 8.7 - 10.4 mg/dL Final   47/42/5956 38.7 8.7 - 10.4 mg/dL Final   56/43/3295 18.8 8.7 - 10.4 mg/dL Final   41/66/0630 9.3 8.5 - 10.2 mg/dL Final   16/03/930 9.6 8.5 - 10.2 mg/dL Final   35/57/3220 9.5 8.5 - 10.2 mg/dL Final   25/42/7062 8.6 8.5 - 10.2 MG/DL Final   37/62/8315 8.7 8.5 - 10.2 MG/DL Final      PTH   Date Value Ref Range Status   06/14/2019 95.0 (H) 12.0 - 72.0 pg/mL Final      HGB   Date Value Ref Range Status   04/27/2020 12.7 (L) 13.5 - 17.5 g/dL Final   17/61/6073 71.0 (L) 13.5 - 17.5 g/dL Final   62/69/4854 62.7 (L) 13.5 - 17.5 g/dL Final   03/50/0938 18.2 (L) 13.5 - 17.5 g/dL Final   99/37/1696 78.9 (L) 13.5 - 17.5 g/dL Final     Hemoglobin   Date Value Ref Range Status   09/26/2016 6.2 (L) 13.5 - 17.5 g/dL Final     Comment:     Point of Care Testing performed at the point of care by trained personnel per documented policies.   09/18/2016 9.7 (L) 13.5 - 17.5 g/dL Final     Comment:     Point of Care Testing performed at the point of care by trained personnel per documented policies.   09/18/2016 9.6 (L) 13.5 - 17.5 g/dL Final     Comment:     Point of Care Testing performed at the point of care by trained personnel per documented policies.   09/15/2016 9.8 (L) 13.5 - 17.5 g/dL Final     Comment:     Point of Care Testing performed at the point of care by trained personnel per documented policies.   09/15/2016 9.1 (L) 13.5 - 17.5 g/dL Final     Comment:     Point of Care Testing performed at the point of care by trained personnel per documented policies.      CO2   Date Value Ref Range Status   12/01/2020 25.1 20.0 - 31.0 mmol/L Final   04/27/2020 23.1 20.0 - 31.0 mmol/L Final   04/06/2020 22.0 20.0 - 31.0 mmol/L Final   01/20/2020 23.7 20.0 - 31.0 mmol/L Final   11/19/2019 21.0 20.0 - 31.0 mmol/L Final   10/10/2013 29 22 - 30 mmol/L Final   04/03/2013 28 22 - 30 mmol/L Final   07/25/2012 30 22 - 30 mmol/L Final   05/13/2010 30 22 - 30 MMOL/L Final         I have reviewed relevant outside healthcare records    Urine Sediment: Bland sediment, no formed cellular elements    IMAGING STUDIES:   04/2018 MR Abdomen with and without contrast:  LOWER CHEST:   The visualized portions of the lower chest are unremarkable.  ABDOMEN/PELVIS:  HEPATOBILIARY:  Patient is status-post liver transplantation. Several peripherally enhancing lesions are again seen within hepatic segments II and VIII, which are stable-to-slightly decreased in size. For reference:  - Slightly decreased size of largest hepatic segment VIII lesion (13:24) which measures 1.7 x 2.1 cm (transverse by AP). This lesion previously measured 2.3 x 2.1 cm.  No new focal hepatic lesions are identified. Gallbladder surgically absent. Mild intrahepatic biliary ductal dilatation is noted, which is improved from prior exam and thought secondary to postcholecystectomy state. No obstructing stone or mass lesion is identified.  PANCREAS: Pancreas is atrophic. Multiple nonenhancing cystic lesions are seen scattered throughout the pancreatic head and body, similar to prior. No new focal mass. No ductal dilatation.  SPLEEN: No focal parenchymal abnormality.  ADRENAL GLANDS: Unremarkable.  KIDNEYS/URETERS: Symmetric nephrograms. No enhancing renal mass. No hydronephrosis.  BOWEL/PERITONEUM/RETROPERITONEUM: No bowel obstruction. No acute inflammatory process. No free fluid.  VASCULATURE: Abdominal aorta within normal limits for patient's age. Unremarkable IVC. The portal, splenic, and superior mesenteric veins are patent.   LYMPH NODES: Previously described enlarged porta hepatis lymph node is no longer well-visualized. No new or enlarging lymphadenopathy is identified within the abdomen.  BONES/SOFT TISSUES: Posterior spinal fixation hardware with associated artifact somewhat limits evaluation of the adjacent osseous structures and soft tissues.   Gynecomastia. No soft tissue abnormality. No worrisome osseous lesion identified.  IMPRESSION:  - Unchanged to slightly decreased size of hepatic abscesses in segments 2 and 8. No new hepatic lesions identified.  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    ASSESSMENT/PLAN:  [name] is a/an 70 y.o. 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   1. Chronic Kidney Disease Stage III: The patient's renal dysfunction is most likely secondary to diabetic nephropathy and prior fluid shifts in the post-transplant requiring CRRT to manage pulmonary hypertension/fluid overload. His creatinine has ranged from 1.6-2 over the past year, most recently 1.9 from 01/11/19. Potassium improved to 5.1 with a bicarbonate level of 25. Continue sodium bicarbonate supplementation.Today's urine sample was devoid of any protein, only trace blood. The patient was counseled on avoiding nephrotoxic agents including but not limited to NSAIDs and contrasted studies.    2. Type 2 Diabetes: Most recent A1c 6.8%, continues to follow with [facility].    3. S/p Liver Transplant: Liver enzymes stable, platelets stable on 90. Currently on Cyclosporine, Cellcept, and Prednisone.   4. Atrial Fibrillation: Rate and rhythm controlled.   5. Hypertension: Has been elevated over the past few clinic visits and at home. Will start low dose Chlorthalidone and re-check labs 2-weeks post medication initiation at [facility] to re-evaluate renal function and electrolytes.     > 50% of this 30 minute visit was spent in explaining diagnoses, answering questions, presenting treatment options and discussing the treatment plan as described above.     [name] will follow up in 4 months

## 2021-02-10 ENCOUNTER — Ambulatory Visit: Admit: 2021-02-10 | Discharge: 2021-02-11 | Payer: MEDICARE | Attending: Internal Medicine | Primary: Internal Medicine

## 2021-02-10 DIAGNOSIS — E1122 Type 2 diabetes mellitus with diabetic chronic kidney disease: Principal | ICD-10-CM

## 2021-02-10 DIAGNOSIS — I10 Essential (primary) hypertension: Principal | ICD-10-CM

## 2021-02-10 DIAGNOSIS — D849 Immunodeficiency, unspecified: Principal | ICD-10-CM

## 2021-02-10 DIAGNOSIS — N1832 Stage 3b chronic kidney disease (CMS-HCC): Principal | ICD-10-CM

## 2021-02-10 DIAGNOSIS — Z944 Liver transplant status: Principal | ICD-10-CM

## 2021-02-10 DIAGNOSIS — Z794 Long term (current) use of insulin: Principal | ICD-10-CM

## 2021-02-10 DIAGNOSIS — R351 Nocturia: Principal | ICD-10-CM

## 2021-02-10 MED ORDER — TAMSULOSIN 0.4 MG CAPSULE
ORAL_CAPSULE | Freq: Every day | ORAL | 1 refills | 30 days | Status: CP
Start: 2021-02-10 — End: ?
  Filled 2021-02-10: qty 30, 30d supply, fill #0

## 2021-02-10 NOTE — Unmapped (Signed)
AOBP:     Left    arm              Medium   cuff     Average : 127/69               Pulse: 63    1st reading:  131/70           Pulse: 63    2nd reading: 125/70           Pulse: 63    3rd reading:  126/67           Pulse: 63

## 2021-02-10 NOTE — Unmapped (Signed)
Addended by: Chales Salmon on: 02/10/2021 03:00 PM     Modules accepted: Orders

## 2021-02-11 ENCOUNTER — Encounter: Payer: Self-pay | Admitting: Gastroenterology

## 2021-02-16 MED ORDER — ROSUVASTATIN 5 MG TABLET
ORAL_TABLET | Freq: Every day | ORAL | 1 refills | 90.00000 days | Status: CP
Start: 2021-02-16 — End: ?
  Filled 2021-02-23: qty 90, 90d supply, fill #0

## 2021-02-16 NOTE — Unmapped (Signed)
[facility] Clinical Assessment & Refill Coordination Note    Jeffrey Ward, DOB: November 29, 1950  Phone: [phone] (home)     All above HIPAA information was verified with patient's family member, wife Ms. Jeffrey Ward.     Was a Nurse, learning disability used for this call? 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 alendronate (FOSAMAX) 70 MG tablet Take 1 tablet (70 mg total) by mouth every seven (7) days. 12 tablet 3   ??? amiodarone (PACERONE) 200 MG tablet Take by mouth.     ??? BASAGLAR KWIKPEN U-100 INSULIN 100 unit/mL (3 mL) injection pen Inject 14 Units subcutaneously at bedtime 15 mL 4   ??? bisacodyL (DULCOLAX) 5 mg EC tablet Take 5 mg by mouth daily as needed.      ??? blood sugar diagnostic Strp Use three (3) times a day before meals. 100 each 7   ??? blood-glucose meter (GLUCOSE MONITORING KIT) kit Use as instructed 1 each 0   ??? chlorthalidone (HYGROTON) 25 MG tablet Take 1/2 tablet (12.5 mg total) by mouth every morning. 90 tablet 0   ??? cholecalciferol, vitamin D3, 125 mcg (5,000 unit) tablet Take 125 mcg by mouth daily.     ??? cream base no.171, bulk, Crea Apply to affected area as needed up to 4 times daily 60 g 0   ??? cycloSPORINE modified (NEORAL) 100 MG capsule Take 2 capsules (200 mg total) in the morning AND 1 capsule (with 3 x 25mg  capsules) nightly. For a total dose of 200 mg in the morning , 175mg  every evening. 270 capsule 3   ??? cycloSPORINE modified (NEORAL) 25 MG capsule Take 3 capsules (75 mg total) by mouth nightly (with a 100mg  capsule). For a total dose of 200 mg in the morning , 175mg  every evening 270 capsule 3   ??? DULoxetine (CYMBALTA) 30 MG capsule Take 2 capsules (60 mg total) by mouth nightly. 60 capsule 11   ??? magnesium oxide-Mg AA chelate (MAGNESIUM, AMINO ACID CHELATE,) 133 mg Take 2 tablets by mouth Two (2) times a day. 120 tablet PRN   ??? melatonin 3 mg Tab Take 1 tablet (3 mg total) by mouth nightly. 30 tablet 0   ??? metoprolol succinate (TOPROL-XL) 25 MG 24 hr tablet Take 25 mg by mouth.     ??? mirtazapine (REMERON) 15 MG tablet Take 1 tablet (15 mg total) by mouth nightly. 90 tablet 3   ??? mycophenolate (CELLCEPT) 250 mg capsule Take 2 capsules (500 mg total) by mouth two (2) times a day. 120 capsule 11   ??? pantoprazole (PROTONIX) 40 MG tablet Take 1 tablet by mouth daily.     ??? pen needle, diabetic (ULTICARE PEN NEEDLE) 32 gauge x 5/32 (4 mm) Ndle USE WITH INSULIN PEN 4 TIMES DAILY AS DIRECTED 100 each 1   ??? pen needle, diabetic 32 gauge x 5/32 (4 mm) Ndle Use as directed four times daily 100 each 1   ??? predniSONE (DELTASONE) 5 MG tablet Take 4 tablets (20 mg) by mouth for one day, then take 3 tablets (15 mg) the next day, then take 2 tablets (10 mg) the next day, then take 1 tablet (5mg ) then stop (Patient not taking: Reported on 02/10/2021) 10 tablet 0   ??? pregabalin (LYRICA) 100 MG capsule Take 1 capsule (100 mg total) by mouth 2 (two)  times daily 60 capsule 11   ??? pregabalin (LYRICA) 75 MG capsule Take 1 capsule (75 mg total) by mouth Two (2) times a day. 60 capsule 6   ??? rosuvastatin (CRESTOR) 5 MG tablet Take 1 tablet (5 mg total) by mouth daily. 90 tablet 1   ??? semaglutide (OZEMPIC) 1 mg/dose (4 mg/3 mL) PnIj injection Inject 1 mg under the skin every seven (7) days. (Patient not taking: Reported on 02/10/2021) 9 mL 3   ??? sildenafiL, pulm.hypertension, (REVATIO) 20 mg tablet Take 2 tablets (40 mg total) by mouth Three (3) times a day. 180 tablet 6   ??? sodium bicarbonate 650 mg tablet Take 1 tablet (650 mg total) by mouth in the morning. 180 tablet 3   ??? sodium polystyrene, SPS, with sorbitol (SPS, WITH SORBITOL,) 15-20 gram/60 mL Susp Take 10mL by mouth once for one dose (Patient not taking: Reported on 02/10/2021) 10 mL 0   ??? tamsulosin (FLOMAX) 0.4 mg capsule Take 1 capsule (0.4 mg total) by mouth daily. 30 capsule 1     No current facility-administered medications for this visit.        Changes to medications: Demetrias reports no changes at this time.    No Known Allergies    Changes to allergies: No    SPECIALTY MEDICATION ADHERENCE     Mycophenolate 250mg   : 12 days of medicine on hand   Cyclosporine 100mg   : 12 days of medicine on hand   Cyclosporine 25mg   : 12 days of medicine on hand       Medication Adherence    Patient reported X missed doses in the last month: 0  Specialty Medication: cyclosporine 100mg   Patient is on additional specialty medications: Yes  Additional Specialty Medications: Cyclosporine 25mg   Patient Reported Additional Medication X Missed Doses in the Last Month: 0  Patient is on more than two specialty medications: Yes  Specialty Medication: mycophenolate 250mg   Patient Reported Additional Medication X Missed Doses in the Last Month: 0  Support network for adherence: family member          Specialty medication(s) dose(s) confirmed: Regimen is correct and unchanged.     Are there any concerns with adherence? No    Adherence counseling provided? Not needed    CLINICAL MANAGEMENT AND INTERVENTION      Clinical Benefit Assessment:    Do you feel the medicine is effective or helping your condition? Yes    Clinical Benefit counseling provided? Not needed    Adverse Effects Assessment:    Are you experiencing any side effects? No    Are you experiencing difficulty administering your medicine? No    Quality of Life Assessment:         How many days over the past month did your transplant  keep you from your normal activities? For example, brushing your teeth or getting up in the morning. 0    Have you discussed this with your provider? Not needed    Acute Infection Status:    Acute infections noted within Epic:  No active infections  Patient reported infection: None    Therapy Appropriateness:    Is therapy appropriate and patient progressing towards therapeutic goals? Yes, therapy is appropriate and should be continued    DISEASE/MEDICATION-SPECIFIC INFORMATION      N/A    PATIENT SPECIFIC NEEDS     - Does the patient have any physical, cognitive, or cultural barriers? No    - Is the patient high  risk? Yes, patient is taking a REMS drug. Medication is dispensed in compliance with REMS program    - Does the patient require a Care Management Plan? No       SHIPPING     Specialty Medication(s) to be Shipped:   Transplant: mycophenolate mofetil 250mg , cyclosporine 100mg  and cyclosporine 25mg     Other medication(s) to be shipped: lyrica, crestor, revatio, mgplus, cymbalta, fosamax, pen needles     Changes to insurance: No    Delivery Scheduled: Yes, Expected medication delivery date: 02/24/2021.  However, Rx request for refills was sent to the provider as there are none remaining.     Medication will be delivered via Next Day Courier to the confirmed prescription address in [facility].    The patient will receive a drug information handout for each medication shipped and additional FDA Medication Guides as required.  Verified that patient has previously received a Conservation officer, historic buildings and a Surveyor, mining.    The patient or caregiver noted above participated in the development of this care plan and knows that they can request review of or adjustments to the care plan at any time.      All of the patient's questions and concerns have been addressed.    Thad Ranger   [facility] Specialty Pharmacist

## 2021-02-16 NOTE — Unmapped (Signed)
As per MD's notes Crestor 5 mg daily. Refilled #90 tablets and 3 refills.

## 2021-02-23 MED FILL — ULTICARE PEN NEEDLE 32 GAUGE X 5/32" (4 MM): 25 days supply | Qty: 100 | Fill #1

## 2021-02-23 MED FILL — PREGABALIN 100 MG CAPSULE: 30 days supply | Qty: 60 | Fill #5

## 2021-02-23 MED FILL — ALENDRONATE 70 MG TABLET: ORAL | 84 days supply | Qty: 12 | Fill #2

## 2021-02-23 MED FILL — DULOXETINE 30 MG CAPSULE,DELAYED RELEASE: ORAL | 30 days supply | Qty: 60 | Fill #6

## 2021-02-23 MED FILL — MYCOPHENOLATE MOFETIL 250 MG CAPSULE: ORAL | 30 days supply | Qty: 120 | Fill #1

## 2021-02-23 MED FILL — CYCLOSPORINE MODIFIED 25 MG CAPSULE: ORAL | 30 days supply | Qty: 90 | Fill #9

## 2021-02-23 MED FILL — MG-PLUS-PROTEIN 133 MG TABLET: ORAL | 30 days supply | Qty: 120 | Fill #10

## 2021-02-23 MED FILL — CYCLOSPORINE MODIFIED 100 MG CAPSULE: ORAL | 30 days supply | Qty: 90 | Fill #8

## 2021-02-23 MED FILL — SILDENAFIL (PULMONARY HYPERTENSION) 20 MG TABLET: ORAL | 30 days supply | Qty: 180 | Fill #1

## 2021-03-09 ENCOUNTER — Ambulatory Visit: Admit: 2021-03-09 | Payer: MEDICARE

## 2021-03-19 DIAGNOSIS — Z944 Liver transplant status: Principal | ICD-10-CM

## 2021-03-19 MED ORDER — MG-PLUS-PROTEIN 133 MG TABLET
ORAL_TABLET | Freq: Two times a day (BID) | ORAL | PRN refills | 30 days
Start: 2021-03-19 — End: ?

## 2021-03-19 NOTE — Unmapped (Signed)
Pt request for RX Refill magnesium oxide-Mg AA chelate (MAGNESIUM, AMINO ACID CHELATE,) 133 mg

## 2021-03-26 NOTE — Unmapped (Signed)
The [facility] has made a second and final attempt to reach this patient to refill the following medication: cyclosporine 25mg  and 100mg , mycophenolate and maintenance meds.      We have left voicemails on the following phone numbers: [phone] & [phone].    Dates contacted: 01/09 & 01/13  Last scheduled delivery: 02/24/2021    The patient may be at risk of non-compliance with this medication. The patient should call the [facility] at [phone]  Option 4, then Option 2 (all other specialty patients) to refill medication.    [name]   [facility] Specialty Technician

## 2021-03-30 DIAGNOSIS — Z944 Liver transplant status: Principal | ICD-10-CM

## 2021-03-30 MED ORDER — MG-PLUS-PROTEIN 133 MG TABLET
ORAL_TABLET | Freq: Two times a day (BID) | ORAL | PRN refills | 30 days | Status: CP
Start: 2021-03-30 — End: ?
  Filled 2021-04-02: qty 120, 30d supply, fill #0

## 2021-03-30 MED ORDER — PEN NEEDLE, DIABETIC 32 GAUGE X 5/32" (4 MM)
1 refills | 0.00000 days
Start: 2021-03-30 — End: ?

## 2021-03-30 NOTE — Unmapped (Signed)
[facility] Refill Coordination Note    Specialty Medication(s) to be Shipped:   Transplant: mycophenolate mofetil 250mg , cyclosporine 100mg  and cyclosporine 25mg     Other medication(s) to be shipped: Pen Needles-Mag Ox-Duloxetine 30 MG-Chlorthalidone 25 MG-Pregbalin 100 MG-Sildenafil 20 MG- Tamsulosin 0.4MG      Jeffrey Ward, DOB: 1950/05/29  Phone: [phone] (home)       All above HIPAA information was verified with patient.     Was a Nurse, learning disability used for this call? No    Completed refill call assessment today to schedule patient's medication shipment from the [facility] [phone]).  All relevant notes have been reviewed.     Specialty medication(s) and dose(s) confirmed: Regimen is correct and unchanged.   Changes to medications: Gordy reports no changes at this time.  Changes to insurance: Yes: Added to [facility]  New side effects reported not previously addressed with a pharmacist or physician: None reported  Questions for the pharmacist: No    Confirmed patient received a Conservation officer, historic buildings and a Surveyor, mining with first shipment. The patient will receive a drug information handout for each medication shipped and additional FDA Medication Guides as required.       DISEASE/MEDICATION-SPECIFIC INFORMATION        N/A    SPECIALTY MEDICATION ADHERENCE     Medication Adherence    Patient reported X missed doses in the last month: 0  Specialty Medication: cycloSPORINE modified 100 MG capsule (NeoraL)  Patient is on additional specialty medications: Yes  Additional Specialty Medications: cycloSPORINE modified 25 MG capsule (NeoraL)  Patient Reported Additional Medication X Missed Doses in the Last Month: 0  Patient is on more than two specialty medications: Yes  Specialty Medication: mycophenolate 250 mg capsule (CELLCEPT)  Patient Reported Additional Medication X Missed Doses in the Last Month: 0  Any gaps in refill history greater than 2 weeks in the last 3 months: no  Demonstrates understanding of importance of adherence: yes  Informant: patient  Reliability of informant: reliable  Support network for adherence: family member  Confirmed plan for next specialty medication refill: delivery by pharmacy  Refills needed for supportive medications: not needed              Were doses missed due to medication being on hold? No    cycloSPORINE modified 100 MG : 7 days of medicine on hand   cycloSPORINE modified 25 MG : 7 days of medicine on hand   mycophenolate 250 mg : 7 days of medicine on hand       REFERRAL TO PHARMACIST     Referral to the pharmacist: Not needed      [facility]     Shipping address confirmed in Epic.     Delivery Scheduled: Yes, Expected medication delivery date: 04/02/21.     Medication will be delivered via Next Day Courier to the prescription address in Epic WAM.    Yolonda Kida   [facility] Pharmacy Specialty Technician

## 2021-03-30 NOTE — Unmapped (Signed)
Pt request for RX Refill magnesium oxide-Mg AA chelate (MAGNESIUM, AMINO ACID CHELATE,) 133 mg

## 2021-04-01 DIAGNOSIS — Z944 Liver transplant status: Principal | ICD-10-CM

## 2021-04-01 DIAGNOSIS — I272 Pulmonary hypertension, unspecified: Principal | ICD-10-CM

## 2021-04-01 NOTE — Unmapped (Signed)
Completed PA for Sildenafil on [email or web address]. Received a follow-up call from [facility] for additional clinical information. Info provided. Waiting for determination.

## 2021-04-02 MED FILL — CYCLOSPORINE MODIFIED 25 MG CAPSULE: ORAL | 30 days supply | Qty: 90 | Fill #10

## 2021-04-02 MED FILL — CHLORTHALIDONE 25 MG TABLET: ORAL | 90 days supply | Qty: 45 | Fill #1

## 2021-04-02 MED FILL — DULOXETINE 30 MG CAPSULE,DELAYED RELEASE: ORAL | 30 days supply | Qty: 60 | Fill #7

## 2021-04-02 MED FILL — MYCOPHENOLATE MOFETIL 250 MG CAPSULE: ORAL | 30 days supply | Qty: 120 | Fill #2

## 2021-04-02 MED FILL — CYCLOSPORINE MODIFIED 100 MG CAPSULE: ORAL | 30 days supply | Qty: 90 | Fill #9

## 2021-04-02 MED FILL — ULTICARE PEN NEEDLE 32 GAUGE X 5/32" (4 MM): 25 days supply | Qty: 100 | Fill #0

## 2021-04-02 MED FILL — PREGABALIN 100 MG CAPSULE: 30 days supply | Qty: 60 | Fill #6

## 2021-04-02 MED FILL — TAMSULOSIN 0.4 MG CAPSULE: ORAL | 30 days supply | Qty: 30 | Fill #0

## 2021-04-02 NOTE — Unmapped (Signed)
Received faxes from [facility] stating Sildenafil medication and quantity limit exception PA's were both approved through 04/01/2022. Copies imported to Media tab.

## 2021-04-06 ENCOUNTER — Ambulatory Visit: Admit: 2021-04-06 | Discharge: 2021-04-07 | Payer: MEDICARE

## 2021-04-06 LAB — BASIC METABOLIC PANEL
ANION GAP: 11 mmol/L (ref 5–14)
BLOOD UREA NITROGEN: 25 mg/dL — ABNORMAL HIGH (ref 9–23)
BUN / CREAT RATIO: 14
CALCIUM: 9.6 mg/dL (ref 8.7–10.4)
CHLORIDE: 103 mmol/L (ref 98–107)
CO2: 21.4 mmol/L (ref 20.0–31.0)
CREATININE: 1.83 mg/dL — ABNORMAL HIGH
EGFR CKD-EPI (2021) MALE: 39 mL/min/{1.73_m2} — ABNORMAL LOW (ref >=60)
GLUCOSE RANDOM: 316 mg/dL — ABNORMAL HIGH (ref 70–179)
SODIUM: 135 mmol/L (ref 135–145)

## 2021-04-06 LAB — TSH: THYROID STIMULATING HORMONE: 2.082 u[IU]/mL (ref 0.550–4.780)

## 2021-04-06 MED FILL — SILDENAFIL (PULMONARY HYPERTENSION) 20 MG TABLET: ORAL | 30 days supply | Qty: 180 | Fill #2

## 2021-04-06 NOTE — Unmapped (Signed)
[facility]   Pulmonary Hypertension Program                                                     H. Sherre Lain, MD--Director (Pulmonary)     Denice Bors. Tresa Res, MD--Associate Director (Pulmonary)    Freeman Caldron, MD (Cardiology)     Bonney Leitz, MD (Pulmonary)    Marva Panda, RN Nurse Coordinator     Claretha Cooper, RN Nurse Coordinator    [phone] office - [phone] fax          [name]  is a 71 y.o. male  patient referred by Jettie Booze, MD for work up and evaluation for pulmonary hypertension.    PAH Treatment History:  Sildenafil 40 mg tid since post liver txplt hospitalization.    Pt describes a history of recent liver txplt in 09/2016.  Post op was noted to have previously unrecognized PAH and RV failure, treated initially with inhaled iloprost while on vent support and then transitioned to sildenafil.  He had a long post op course requiring a stay in rehab of about 2 weeks prior to discharge.   His post op course was further complicated by AKI (on CRRT then had renal recovery), afib, bradycardia requiring pacemaker placement.  CTA negative for PE post op.      No LE edema currently.      He continues on sildenafil 40 tid and is receiving from [facility].      He is no longer on warfarin.    No syncope or near syncope.      Denies dyspnea, syncope or near syncope.      Had recent hip fx s/p surgery end of Sept 2019.  Currently undergoing rehab.      Has been having URI symptoms last two day.  Antibx was called in by his PCP.  No fevers.      INTERVAL HPI 11/19/19:    Patient reports doing well.  No changes since last visit aside from feeling a little more dyspneic with activities.  He has gained weight and thinks may be related to that.    No syncope or near syncope.  No LE edema noted.    Continues on sildenafil.      Patient refuses to get COVID vaccine.      INTERVAL HPI 07/31/20:    Patient continues to feel weakness and some dypsnea, but not changed compared to prior visit.    No LE edema noted.  No chest pain.     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    No syncope or near syncope.     INTERVAL HPI 12/01/20:    Patient here for follow up. Continues with same level of dyspnea he has had since liver tsplt.      No LE edema.     Denies syncope.      Continues on sildenafil for POPH.    Is overdue for echo--states he was not called to schedule after it was ordered at last visit.    INTERVAL HPI 04/06/21:    States he is doing well in terms of exertional capacity.  No syncope or near syncope.  No LE edema noted.    Asks for urology  referral due to concern for BPH.    Continues on sildenafil with no issues for POPH.      Overdue for follow up with hepatology.          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      The patient's current NYHA functional class is Class 2 - comfortable at rest but have symptoms with ordinary physcial activity.     Will get follow up echo and check pro-BNP.     Overall he appears stable with no signs of RV failure clinically.    Needs follow up with hepatology.        Plan:          1) continue sildenafil 40 mg tid.      2) next visit in 4 months    3) pro BNP and BMP and TSH (patient reports temperature intolerances) today    4) echo in coming weeks.    5) hepatology reach out for follow up.    6) urology referral as requested by patient.       Subjective:      Past medical history is significant for:   Past Medical History:   Diagnosis Date   ??? Atrial fibrillation (CMS-HCC)    ??? Basal cell carcinoma    ??? Cancer (CMS-HCC)    ??? Cirrhosis (CMS-HCC)    ??? Depression    ??? Diabetes (CMS-HCC)    ??? Headache    ??? Hepatitis C 07/17/2012   ??? History of transfusion    ??? Liver disease    ??? Low back pain 07/17/2012   ??? Varices, esophageal (CMS-HCC)       Family History   Problem Relation Age of Onset   ??? Hypertension Mother    ??? Cancer Mother    ??? Heart disease Mother    ??? Heart disease Father    ??? Cirrhosis Neg Hx    ??? Liver cancer Neg Hx    ??? Anemia Neg Hx    ??? Diabetes Neg Hx    ??? Kidney disease Neg Hx    ??? Obesity Neg Hx    ??? Thyroid disease Neg Hx    ??? Osteoporosis Neg Hx    ??? Coronary artery disease Neg Hx    ??? Anesthesia problems Neg Hx    ??? Basal cell carcinoma Neg Hx    ??? Squamous cell carcinoma Neg Hx       Social History     Socioeconomic History   ??? Marital status: Married   Tobacco Use   ??? Smoking status: Never   ??? Smokeless tobacco: Never   ??? Tobacco comments:     Smoked in high school for about 5 years.    Substance and Sexual Activity   ??? Alcohol use: No   ??? Drug use: No   ??? Sexual activity: Yes     Partners: Female     Birth control/protection: None   Other Topics Concern   ??? Exercise No   ??? Living Situation No      Past Surgical History:   Procedure Laterality Date   ??? ANKLE SURGERY     ??? BACK SURGERY     ??? BACK SURGERY     ??? CARDIAC SURGERY      pacemaker   ??? CHG US GUIDE, TISSUE ABLATION N/A 01/22/2016    Procedure: ULTRASOUND  GUIDANCE FOR, AND MONITORING OF, PARENCHYMAL TISSUE ABLATION;  Surgeon: Particia Nearing, MD;  Location: MAIN OR [facility];  Service: Transplant   ??? IR EMBOLIZATION ORGAN ISCHEMIA, TUMORS, INFAR  06/16/2016    IR EMBOLIZATION ORGAN ISCHEMIA, TUMORS, INFAR 06/16/2016 Ammie Dalton, MD IMG VIR H&V [name]   ??? PR COLON CA SCRN NOT HI RSK IND N/A 02/27/2015    Procedure: COLOREC CNCR SCR;COLNSCPY NO;  Surgeon: Vonda Antigua, MD;  Location: GI PROC[facility]URES [facility];  Service: Gastroenterology   ??? PR ENDOSCOPIC ULTRASOUND EXAM N/A 02/27/2015    Procedure: UGI ENDO; W/ENDO ULTRASOUND EXAM INCLUDES ESOPHAGUS, STOMACH, &/OR DUODENUM/JEJUNUM;  Surgeon: Vonda Antigua, MD;  Location: GI PROC[facility]URES [facility]OR[facility];  Service: Gastroenterology   ??? PR ERCP BALLOON DILATE BILIARY/PANC DUCT/AMPULLA EA N/A 02/10/2017    Procedure: ERCP;WITH TRANS-ENDOSCOPIC BALLOON DILATION OF BILIARY/PANCREATIC DUCT(S) OR OF AMPULLA, INCLUDING SPHINCTERECTOMY, WHEN PERFOREMD,EACH DUCT ([id number]);  Surgeon: Mayford Knife, MD;  Location: GI PROCEDURES [facility];  Service: Gastroenterology   ??? PR ERCP REMOVE FOREIGN BODY/STENT BILIARY/PANC DUCT N/A 02/10/2017    Procedure: ENDOSCOPIC RETROGRADE CHOLANGIOPANCREATOGRAPHY (ERCP); W/ REMOVAL OF FOREIGN BODY/STENT FROM BILIARY/PANCREATIC DUCT(S);  Surgeon: Mayford Knife, MD;  Location: GI PROCEDURES [facility];  Service: Gastroenterology   ??? PR ERCP REMOVE FOREIGN BODY/STENT BILIARY/PANC DUCT N/A 06/29/2017    Procedure: ENDOSCOPIC RETROGRADE CHOLANGIOPANCREATOGRAPHY (ERCP); W/ REMOVAL OF FOREIGN BODY/STENT FROM BILIARY/PANCREATIC DUCT(S);  Surgeon: Vonda Antigua, MD;  Location: GI PROCEDURES [facility];  Service: Gastroenterology   ??? PR ERCP STENT PLACEMENT BILIARY/PANCREATIC DUCT N/A 11/29/2016    Procedure: ENDOSCOPIC RETROGRADE CHOLANGIOPANCREATOGRAPHY (ERCP); WITH PLACEMENT OF ENDOSCOPIC STENT INTO BILIARY OR PANCREATIC DUCT;  Surgeon: Chriss Driver, MD;  Location: GI PROCEDURES [facility];  Service: Gastroenterology   ??? PR ERCP STENT PLACEMENT BILIARY/PANCREATIC DUCT N/A 04/26/2017    Procedure: ENDOSCOPIC RETROGRADE CHOLANGIOPANCREATOGRAPHY (ERCP); WITH PLACEMENT OF ENDOSCOPIC STENT INTO BILIARY OR PANCREATIC DUCT;  Surgeon: Mayford Knife, MD;  Location: GI PROCEDURES MEMORIAL [facility];  Service: Gastroenterology   ??? PR ERCP,W/REMOVAL STONE,BIL/PANCR DUCTS N/A 11/29/2016    Procedure: ERCP; W/ENDOSCOPIC RETROGRADE REMOVAL OF CALCULUS/CALCULI FROM BILIARY &/OR PANCREATIC DUCTS;  Surgeon: Chriss Driver, MD;  Location: GI PROCEDURES [facility];  Service: Gastroenterology   ??? PR ERCP,W/REMOVAL STONE,BIL/PANCR DUCTS N/A 02/10/2017    Procedure: ERCP; W/ENDOSCOPIC RETROGRADE REMOVAL OF CALCULUS/CALCULI FROM BILIARY &/OR PANCREATIC DUCTS;  Surgeon: Mayford Knife, MD;  Location: GI PROCEDURES [facility];  Service: Gastroenterology   ??? PR ERCP,W/REMOVAL STONE,BIL/PANCR DUCTS N/A 04/26/2017    Procedure: ERCP; W/ENDOSCOPIC RETROGRADE REMOVAL OF CALCULUS/CALCULI FROM BILIARY &/OR PANCREATIC DUCTS;  Surgeon: Mayford Knife, MD;  Location: GI PROCEDURES MEMORIAL [facility];  Service: Gastroenterology   ??? PR ERCP,W/REMOVAL STONE,BIL/PANCR DUCTS N/A 06/29/2017    Procedure: ERCP; W/ENDOSCOPIC RETROGRADE REMOVAL OF CALCULUS/CALCULI FROM BILIARY &/OR PANCREATIC DUCTS;  Surgeon: Vonda Antigua, MD;  Location: GI PROCEDURES MEMOR[facility];  Service: Gastroenterology   ??? PR ESOPHAGOSCP RIG TRANSORAL HYPOPHARYNX CRV ESOPH N/A 04/04/2018    Procedure: ESOPHAGOSCOPY, RIGID, TRANSORAL WITH DIVERTICULECTOMY OF HYPOPHARYNX OR CERVICAL ESOPHAGUS, WITH CRICOPHARYNGEAL MYOTOMY, INCLUDES USE OF TELESCOPE OR OPERATING MICROSCOPE AND REPAIR, WHEN PERFORMED;  Surgeon: Vista Deck, MD;  Location: MAIN OR [facility];  Service: ENT   ??? PR INSER HEART TEMP PACER ONE CHMBR N/A 10/02/2016    Procedure: Tempoarary Pacemaker Insertion;  Surgeon: Meredith Leeds, MD;  Location: [facility];  Service: Cardiology   ??? PR LAP,DIAGNOSTIC ABDOMEN N/A 01/22/2016    Procedure: Laparoscopy,  Abdomen, Peritoneum, & Omentum, Diagnostic, W/Wo Collection Specimen(S) By Brushing Or Washing;  Surgeon: Particia Nearing, MD;  Location: MAIN OR [facility];  Service: Transplant   ??? PR LARYNGOPLASTY MEDIALIZATION UNLIATERAL N/A 11/07/2018    Procedure: R31 LARYNGOPLASTY, MEDIALIZATION, UNILATERAL;  Surgeon: Vista Deck, MD;  Location: MAIN OR [facility];  Service: ENT   ??? PR PLACE PERCUT GASTROSTOMY TUBE N/A 11/17/2016    Procedure: UGI ENDO; W/DIRECTED PLCMT PERQ GASTROSTOMY TUBE;  Surgeon: Cletis Athens, MD;  Location: GI PROCEDURES [facility];  Service: Gastroenterology   ??? PR TRACHEOSTOMY, PLANNED N/A 09/29/2016    Procedure: TRACHEOSTOMY PLANNED (SEPART PROC);  Surgeon: Katherina Mires, MD;  Location: MAIN OR [facility];  Service: Trauma   ??? PR TRANSCATH INSERT OR REPLACE LEADLESS PM VENTR N/A 10/11/2016    Procedure: Pacemaker Implant/Replace Leadless;  Surgeon: Meredith Leeds, MD;  Location: [facility];  Service: Cardiology   ??? PR TRANSPLANT LIVER,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    Procedure: Liver Allotransplantation; Orthotopic, Partial Or Whole, From Cadaver Or Living Donor, Any Age;  Surgeon: Doyce Loose, MD;  Location: MAIN OR [facility];  Service: Transplant   ??? PR TRANSPLANT,PREP DONOR LIVER, WHOLE N/A 09/15/2016    Procedure: Rogelia Boga Std Prep Cad Donor Whole Liver Gft Prior Tnsplnt,Inc Chole,Diss/Rem Surr Tissu Wo Triseg/Lobe Splt;  Surgeon: Doyce Loose, MD;  Location: MAIN OR [facility];  Service: Transplant   ??? PR UPPER GI ENDOSCOPY,BIOPSY N/A 07/17/2012    Procedure: UGI ENDOSCOPY; WITH BIOPSY, SINGLE OR MULTIPLE;  Surgeon: Alba Destine, MD;  Location: GI PROCEDURES [facility];  Service: Gastroenterology   ??? PR UPPER GI ENDOSCOPY,DIAGNOSIS N/A 02/04/2014    Procedure: UGI ENDO, INCLUDE ESOPHAGUS, STOMACH, & DUODENUM &/OR JEJUNUM; DX W/WO COLLECTION SPECIMN, BY BRUSH OR WASH;  Surgeon: Wilburt Finlay, MD;  Location: GI PROCEDURES [facility];  Service: Gastroenterology   ??? PR UPPER GI ENDOSCOPY,LIGAT VARIX N/A 11/05/2013    Procedure: UGI ENDO; W/BAND LIG ESOPH &/OR GASTRIC VARICES;  Surgeon: Wilburt Finlay, MD;  Location: [facility] PROC[facility]URES [facility];  Service: Gastroenterology        Objective:   Objective   Review of systems is significant for:     As stated in the HPI, remainder of 12 system review is negative.      No Known Allergies   Current Outpatient Medications   Medication Sig Dispense Refill   ??? alendronate (FOSAMAX) 70 MG tablet Take 1 tablet (70 mg total) by mouth every seven (7) days. 12 tablet 3   ??? amiodarone (PACERONE) 200 MG tablet Take by mouth.     ??? BASAGLAR KWIKPEN U-100 INSULIN 100 unit/mL (3 mL) injection pen Inject 14 Units subcutaneously at bedtime 15 mL 4   ??? bisacodyL (DULCOLAX) 5 mg EC tablet Take 5 mg by mouth daily as needed. ??? blood-glucose meter (GLUCOSE MONITORING KIT) kit Use as instructed 1 each 0   ??? chlorthalidone (HYGROTON) 25 MG tablet Take 1/2 tablet (12.5 mg total) by mouth every morning. 90 tablet 0   ??? cholecalciferol, vitamin D3, 125 mcg (5,000 unit) tablet Take 125 mcg by mouth daily.     ??? cream base no.171, bulk, Crea Apply to affected area as needed up to 4 times daily 60 g 0   ??? cycloSPORINE modified (NEORAL) 100 MG capsule Take 2 capsules (200 mg total) in the morning AND 1 capsule (with 3 x 25mg  capsules) nightly. For a total dose of 200 mg in the morning , 175mg  every  evening. 270 capsule 3   ??? cycloSPORINE modified (NEORAL) 25 MG capsule Take 3 capsules (75 mg total) by mouth nightly (with a 100mg  capsule). For a total dose of 200 mg in the morning , 175mg  every evening 270 capsule 3   ??? DULoxetine (CYMBALTA) 30 MG capsule Take 2 capsules (60 mg total) by mouth nightly. 60 capsule 11   ??? magnesium oxide-Mg AA chelate (MAGNESIUM, AMINO ACID CHELATE,) 133 mg Take 2 tablets by mouth Two (2) times a day. 120 tablet PRN   ??? melatonin 3 mg Tab Take 1 tablet (3 mg total) by mouth nightly. 30 tablet 0   ??? metoprolol succinate (TOPROL-XL) 25 MG 24 hr tablet Take 25 mg by mouth.     ??? mycophenolate (CELLCEPT) 250 mg capsule Take 2 capsules (500 mg total) by mouth two (2) times a day. 120 capsule 11   ??? pantoprazole (PROTONIX) 40 MG tablet Take 1 tablet by mouth daily.     ??? pen needle, diabetic (PEN NEEDLE) 32 gauge x 5/32 (4 mm) Ndle Use as directed four times daily 100 each 1   ??? pregabalin (LYRICA) 100 MG capsule Take 1 capsule (100 mg total) by mouth 2 (two) times daily 60 capsule 11   ??? pregabalin (LYRICA) 75 MG capsule Take 1 capsule (75 mg total) by mouth Two (2) times a day. 60 capsule 6   ??? rosuvastatin (CRESTOR) 5 MG tablet Take 1 tablet (5 mg total) by mouth daily. 90 tablet 1   ??? sildenafiL, pulm.hypertension, (REVATIO) 20 mg tablet Take 2 tablets (40 mg total) by mouth Three (3) times a day. 180 tablet 6 ??? sodium bicarbonate 650 mg tablet Take 1 tablet (650 mg total) by mouth in the morning. 180 tablet 3   ??? tamsulosin (FLOMAX) 0.4 mg capsule Take 1 capsule (0.4 mg total) by mouth daily. 30 capsule 1   ??? blood sugar diagnostic Strp Use three (3) times a day before meals. 100 each 7   ??? mirtazapine (REMERON) 15 MG tablet Take 1 tablet (15 mg total) by mouth nightly. 90 tablet 3   ??? pen needle, diabetic (ULTICARE PEN NEEDLE) 32 gauge x 5/32 (4 mm) Ndle USE WITH INSULIN PEN 4 TIMES DAILY AS DIRECTED 100 each 1   ??? predniSONE (DELTASONE) 5 MG tablet Take 4 tablets (20 mg) by mouth for one day, then take 3 tablets (15 mg) the next day, then take 2 tablets (10 mg) the next day, then take 1 tablet (5mg ) then stop (Patient not taking: Reported on 02/10/2021) 10 tablet 0   ??? semaglutide (OZEMPIC) 1 mg/dose (4 mg/3 mL) PnIj injection Inject 1 mg under the skin every seven (7) days. (Patient not taking: Reported on 02/10/2021) 9 mL 3   ??? sodium polystyrene, SPS, with sorbitol (SPS, WITH SORBITOL,) 15-20 gram/60 mL Susp Take 10mL by mouth once for one dose (Patient not taking: Reported on 02/10/2021) 10 mL 0     No current facility-administered medications for this visit.        Physical Exam:    height is 175.3 cm (5' 9) and weight is 71 kg (156 lb 9.6 oz). His temperature is 36.6 ??C (97.9 ??F). His blood pressure is 107/66 and his pulse is 61. His respiration is 20 and oxygen saturation is 98%.   General Appearance:    Alert, cooperative, no distress, appears stated age   Head:    Normocephalic, without obvious abnormality, atraumatic   Eyes:    PERRL, conjunctiva  clear, EOM's intact    Oropharynx:   No lesions   Neck:   Supple, trachea midline, no adenopathy;    No JVD   Lungs:     Good breath sounds b/l, no crackles or wheezes, respirations unlabored   Chest Wall:    No tenderness or deformity    Heart:    Regular rate and rhythm, S1 and S2 normal, no murmur, rub   or gallop   Abdomen:     Soft, non-tender, normal bowel sounds, no masses, no organomegaly.  + G tube in place   Extremities:   Extremities normal, no cyanosis, clubbing, or edema   Pulses:   2+ and symmetric all extremities   Skin:   No rashes or lesions   Lymph nodes:   No cervical or supraclavicular adenopathy   Neurologic:   No focal neurological deficits       Diagnostic Review:    Echo 04/21/17:    Left Ventricle The left ventricle is normal in size with normal wall thickness.   The left ventricular systolic function is normal.   The ejection fraction is visually estimated at 65 to 70%.   There is impaired left ventricular relaxation with normal left ventricular filling pressure consistent with grade I diastolic dysfunction.   Mitral Valve The mitral valve leaflets are normal, with normal leaflet mobility.   There is no significant mitral regurgitation by color and continuous wave Doppler imaging.   Left Atrium The left atrium is dilated.   Aortic Valve The aortic valve is poorly visualized with probably normal excursion.   There is no significant aortic regurgitation by color and continuous wave Doppler imaging.  There is no evidence of a significant transvalvular gradient.   - Peak transvalvular velocity: 1 m/sec   Ascending Aorta The aorta is normal in size in the visualized segments.   Pulmonary Artery The pulmonary artery is not well visualized.   Pulmonic Valve The pulmonic valve is poorly visualized, but probably normal.   There is no significant pulmonic regurgitation by color and continuous wave Doppler imaging.   There is no evidence of a significant transvalvular gradient.   - Peak transvalvular velocity: 0.9 m/sec   Right Ventricle The right ventricle is normal in size with normal wall thickness.   The right ventricular systolic function is normal.   Tricuspid Valve The tricuspid valve leaflets are normal, with normal leaflet mobility.   There is trivial tricuspid regurgitation by color and continuous wave Doppler imaging.   - The Doppler signal is suboptimal and pulmonary artery systolic pressure cannot be accurately estimated.   Right Atrium The right atrium is normal in size.   IVC/SVC The IVC diameter is <=21 mm with >50% decrease in size with inspiration suggesting normal right atrial pressure (0-5 mm Hg).   Pericardium There is no evidence of a significant pericardial effusion.         V/Q scan 01/11/17:    Impression     Normal ventilation and perfusion scan.         CT scan PE protocol performed on 09/2016 post op revealed:    Impression     No CT evidence for acute pulmonary embolism, as clinically questioned.    Moderate bilateral pleural effusions similar to prior CT dated 01/30/2016 with slightly increased atelectasis of the adjacent lung parenchyma in the bilateral lower lobes, left greater than right.    Addendum (Dr.Altun dictating 09/27/2016 08:35 AM) Focal contour bulging of the liver adjacent to  the diaphragm and right kidney is noted which is not well evaluated due to the presence of streak artifacts.          Pulmonary Function Test performed on 11/24/15 show:            Consistent with normal spirometry and normal DLCO    6 Minute Walk Test performed on 01/06/17:         Echo performed on 09/27/16:    Echocardiographic Findings     Left Ventricle The left ventricle is normal in size with normal wall thickness.   The left ventricular systolic function is normal.   The ejection fraction is visually estimated at 60 to 65%.   Left ventricular diastolic function cannot be accurately assessed.      Left Atrium The left atrium is normal in size.      Right Ventricle The right ventricle is moderately dilated.   The right ventricular systolic function is decreased.      Tricuspid Valve The tricuspid valve is not well visualized.   There is mild tricuspid regurgitation by color and continuous wave Doppler imaging.   The regurgitation jet is directed toward the septum.   The pulmonary artery systolic pressure is severely elevated.   - Peak tricuspid regurgitant velocity: 4.6 m/sec  - Estimated pulmonary artery systolic pressure: 80 + RA pressure mmHg      Right Atrium The right atrium is poorly visualized but appears dilated      IVC/SVC Mechanical ventilation precludes the ability to accurately assess right atrial pressure.      Pericardium There is a small anterior pericardial effusion.          [facility] ICU Catheterization performed on date 09/22/16:     Wedge pressure initially looks to be 15-16 but with further balloon deflation this clearly decreases further to 8 mmHg with very good quality tracing. At that time mPAP 50, CVP 9, CO by CCO measurements 4.1/CI 2.2, PVR approximating 10 WU.

## 2021-04-07 LAB — PRO-BNP: PRO-BNP: 313 pg/mL — ABNORMAL HIGH (ref 0.0–125.0)

## 2021-04-07 NOTE — Unmapped (Signed)
Thank you for visiting the [facility].    If you have any questions or concerns, please call     H. James Arkeem Harts, MD    Laura Nowicki, RN  Sara Decker, RN    Pulmonary Hypertension nurse coordinators:    [phone]

## 2021-04-08 NOTE — Unmapped (Signed)
1/26 - Contacted pt to schedule annual liver txp follow up, LVM for pt to return ph call.

## 2021-04-13 NOTE — Unmapped (Signed)
1/30 - Contacted pt to schedule annual liver txp follow up, spoke with pt about appt details he would like [place] location, states he can only do afternoon appt, pt also states the he will prefer labs one local. Informed pt to have labs a week prior to appt with provider. Pt would like letter via mail. Pt verbalized understanding all discussed.

## 2021-04-21 DIAGNOSIS — Z944 Liver transplant status: Principal | ICD-10-CM

## 2021-04-21 DIAGNOSIS — D849 Immunodeficiency, unspecified: Principal | ICD-10-CM

## 2021-04-21 MED ORDER — CYCLOSPORINE MODIFIED 100 MG CAPSULE
ORAL_CAPSULE | ORAL | 3 refills | 90 days
Start: 2021-04-21 — End: 2022-04-21

## 2021-04-21 MED ORDER — CYCLOSPORINE MODIFIED 25 MG CAPSULE
ORAL_CAPSULE | Freq: Every evening | ORAL | 3 refills | 90 days
Start: 2021-04-21 — End: 2022-04-21

## 2021-04-21 MED ORDER — TAMSULOSIN 0.4 MG CAPSULE
ORAL_CAPSULE | Freq: Every day | ORAL | 1 refills | 30.00000 days
Start: 2021-04-21 — End: ?

## 2021-04-21 NOTE — Unmapped (Signed)
[facility] Refill Coordination Note    Specialty Medication(s) to be Shipped:   Transplant: mycophenolate mofetil 20mg , cyclosporine 25mg  and cyclosporine 100mg     Other medication(s) to be shipped: duloxetine,magnesium,pregablin,sildenafil,tamsulosin,     Jeffrey Ward, DOB: [birth year]  Phone: [phone] (home)       All above HIPAA information was verified with patient.     Was a Nurse, learning disability used for this call? No    Completed refill call assessment today to schedule patient's medication shipment from the [facility] [phone]).  All relevant notes have been reviewed.     Specialty medication(s) and dose(s) confirmed: Regimen is correct and unchanged.   Changes to medications: Jeffrey Ward reports no changes at this time.  Changes to insurance: No  New side effects reported not previously addressed with a pharmacist or physician: None reported  Questions for the pharmacist: No    Confirmed patient received a Conservation officer, historic buildings and a Surveyor, mining with first shipment. The patient will receive a drug information handout for each medication shipped and additional FDA Medication Guides as required.       DISEASE/MEDICATION-SPECIFIC INFORMATION        N/A    SPECIALTY MEDICATION ADHERENCE     Medication Adherence    Patient reported X missed doses in the last month: 0  Specialty Medication: Cyclosporine 25mg   Patient is on additional specialty medications: Yes  Additional Specialty Medications: Cyclosporine 100mg   Patient Reported Additional Medication X Missed Doses in the Last Month: 0  Patient is on more than two specialty medications: Yes  Specialty Medication: Mycophenolate 250mg   Patient Reported Additional Medication X Missed Doses in the Last Month: 0  Any gaps in refill history greater than 2 weeks in the last 3 months: no  Demonstrates understanding of importance of adherence: yes  Informant: patient  Reliability of informant: reliable  Provider-estimated medication adherence level: good  Patient is at risk for Non-Adherence: No  Support network for adherence: family member  Confirmed plan for next specialty medication refill: delivery by pharmacy  Refills needed for supportive medications: not needed          Refill Coordination    Has the Patients' Contact Information Changed: No  Is the Shipping Address Different: No         Were doses missed due to medication being on hold? No    cyclosporine 25 mg: 10 days of medicine on hand   cyclosporine 100 mg: 10 days of medicine on hand   mycophenolate 250 mg: 10 days of medicine on hand         REFERRAL TO PHARMACIST     Referral to the pharmacist: Not needed      [facility]     Shipping address confirmed in Epic.     Delivery Scheduled: Yes, Expected medication delivery date: 02/16.     Medication will be delivered via Next Day Courier to the prescription address in Epic WAM.    Jeffrey Ward   [facility] Specialty Technician

## 2021-04-22 MED ORDER — TAMSULOSIN 0.4 MG CAPSULE
ORAL_CAPSULE | Freq: Every day | ORAL | 0 refills | 90 days | Status: CP
Start: 2021-04-22 — End: ?
  Filled 2021-04-28: qty 90, 90d supply, fill #0

## 2021-04-22 MED ORDER — CYCLOSPORINE MODIFIED 25 MG CAPSULE
ORAL_CAPSULE | Freq: Every evening | ORAL | 3 refills | 90 days | Status: CP
Start: 2021-04-22 — End: 2022-04-22

## 2021-04-22 MED ORDER — CYCLOSPORINE MODIFIED 100 MG CAPSULE
ORAL_CAPSULE | ORAL | 3 refills | 90 days | Status: CP
Start: 2021-04-22 — End: ?
  Filled 2021-04-28: qty 90, 30d supply, fill #0

## 2021-04-22 NOTE — Unmapped (Signed)
Per the last MD note pt to continue taking Flomax 0.4mg  daily. Refilled #90 with no refills. Has a follow up appt. 07/08/21.

## 2021-04-22 NOTE — Unmapped (Signed)
Unable to leave a message, voicemail is full. Sent [employer]. We'll continue to follow-up with him.  Thanks

## 2021-04-22 NOTE — Unmapped (Signed)
The patient is requesting a medication refill

## 2021-04-22 NOTE — Unmapped (Signed)
This patient needs a follow up with a fellow, Dr. Charisse Klinefelter or [place] in April please.

## 2021-04-22 NOTE — Unmapped (Signed)
Called patient to schedule 6 month follow-up with Dr. Cherly Hensen, [place], [place] or [place] for April/May. Previous patient of Dr. Loura Pardon. Unable to leave a message, voicemail is full. Sent [employer].

## 2021-04-26 NOTE — Unmapped (Signed)
All set for 4/27

## 2021-04-28 ENCOUNTER — Ambulatory Visit: Admit: 2021-04-28 | Payer: MEDICARE

## 2021-04-28 ENCOUNTER — Emergency Department: Payer: Medicare Other

## 2021-04-28 ENCOUNTER — Inpatient Hospital Stay
Admission: EM | Admit: 2021-04-28 | Discharge: 2021-05-03 | DRG: 637 | Disposition: A | Discharge status: Home Health | Payer: Medicare Other | Attending: Internal Medicine | Admitting: Internal Medicine

## 2021-04-28 DIAGNOSIS — I5032 Chronic diastolic (congestive) heart failure: Secondary | ICD-10-CM | POA: Diagnosis present

## 2021-04-28 DIAGNOSIS — I272 Pulmonary hypertension, unspecified: Secondary | ICD-10-CM | POA: Diagnosis present

## 2021-04-28 DIAGNOSIS — I130 Hypertensive heart and chronic kidney disease with heart failure and stage 1 through stage 4 chronic kidney disease, or unspecified chronic kidney disease: Secondary | ICD-10-CM | POA: Diagnosis present

## 2021-04-28 DIAGNOSIS — K219 Gastro-esophageal reflux disease without esophagitis: Secondary | ICD-10-CM | POA: Diagnosis present

## 2021-04-28 DIAGNOSIS — R6520 Severe sepsis without septic shock: Secondary | ICD-10-CM | POA: Diagnosis not present

## 2021-04-28 DIAGNOSIS — I480 Paroxysmal atrial fibrillation: Secondary | ICD-10-CM | POA: Diagnosis present

## 2021-04-28 DIAGNOSIS — E860 Dehydration: Secondary | ICD-10-CM | POA: Diagnosis present

## 2021-04-28 DIAGNOSIS — Z79899 Other long term (current) drug therapy: Secondary | ICD-10-CM | POA: Diagnosis not present

## 2021-04-28 DIAGNOSIS — Z950 Presence of cardiac pacemaker: Secondary | ICD-10-CM

## 2021-04-28 DIAGNOSIS — E1110 Type 2 diabetes mellitus with ketoacidosis without coma: Secondary | ICD-10-CM | POA: Diagnosis present

## 2021-04-28 DIAGNOSIS — N1832 Chronic kidney disease, stage 3b: Secondary | ICD-10-CM | POA: Diagnosis present

## 2021-04-28 DIAGNOSIS — E1122 Type 2 diabetes mellitus with diabetic chronic kidney disease: Secondary | ICD-10-CM | POA: Diagnosis present

## 2021-04-28 DIAGNOSIS — R6510 Systemic inflammatory response syndrome (SIRS) of non-infectious origin without acute organ dysfunction: Secondary | ICD-10-CM

## 2021-04-28 DIAGNOSIS — Z944 Liver transplant status: Secondary | ICD-10-CM

## 2021-04-28 DIAGNOSIS — W19XXXA Unspecified fall, initial encounter: Secondary | ICD-10-CM | POA: Diagnosis not present

## 2021-04-28 DIAGNOSIS — F32A Depression, unspecified: Secondary | ICD-10-CM | POA: Diagnosis present

## 2021-04-28 DIAGNOSIS — Z9114 Patient's other noncompliance with medication regimen: Secondary | ICD-10-CM | POA: Diagnosis not present

## 2021-04-28 DIAGNOSIS — Z79890 Hormone replacement therapy (postmenopausal): Secondary | ICD-10-CM

## 2021-04-28 DIAGNOSIS — Z7983 Long term (current) use of bisphosphonates: Secondary | ICD-10-CM | POA: Diagnosis not present

## 2021-04-28 DIAGNOSIS — N400 Enlarged prostate without lower urinary tract symptoms: Secondary | ICD-10-CM | POA: Diagnosis present

## 2021-04-28 DIAGNOSIS — K7460 Unspecified cirrhosis of liver: Secondary | ICD-10-CM | POA: Diagnosis present

## 2021-04-28 DIAGNOSIS — G9340 Encephalopathy, unspecified: Secondary | ICD-10-CM

## 2021-04-28 DIAGNOSIS — F419 Anxiety disorder, unspecified: Secondary | ICD-10-CM | POA: Diagnosis present

## 2021-04-28 DIAGNOSIS — G9341 Metabolic encephalopathy: Secondary | ICD-10-CM | POA: Diagnosis present

## 2021-04-28 DIAGNOSIS — R52 Pain, unspecified: Secondary | ICD-10-CM

## 2021-04-28 DIAGNOSIS — Z20822 Contact with and (suspected) exposure to covid-19: Secondary | ICD-10-CM | POA: Diagnosis present

## 2021-04-28 DIAGNOSIS — Z7901 Long term (current) use of anticoagulants: Secondary | ICD-10-CM

## 2021-04-28 DIAGNOSIS — A419 Sepsis, unspecified organism: Secondary | ICD-10-CM | POA: Diagnosis not present

## 2021-04-28 DIAGNOSIS — T45516A Underdosing of anticoagulants, initial encounter: Secondary | ICD-10-CM | POA: Diagnosis present

## 2021-04-28 LAB — URINALYSIS, COMPLETE (UACMP) WITH MICROSCOPIC
Bacteria, UA: NONE SEEN
Bilirubin Urine: NEGATIVE
Glucose, UA: >=500 mg/dL — AB
Ketones, ur: 20 mg/dL — AB
Leukocytes,Ua: NEGATIVE
Nitrite: NEGATIVE
Protein, ur: 100 mg/dL — AB
Specific Gravity, Urine: 1.023 (ref 1.005–1.030)
Squamous Epithelial / HPF: NONE SEEN (ref 0–5)
pH: 5 (ref 5.0–8.0)

## 2021-04-28 LAB — BASIC METABOLIC PANEL
Anion gap: 10 (ref 5–15)
BUN: 41 mg/dL — ABNORMAL HIGH (ref 8–23)
CO2: 20 mmol/L — ABNORMAL LOW (ref 22–32)
Calcium: 9.5 mg/dL (ref 8.9–10.3)
Chloride: 106 mmol/L (ref 98–111)
Creatinine, Ser: 1.97 mg/dL — ABNORMAL HIGH (ref 0.61–1.24)
GFR, Estimated: 35 mL/min — ABNORMAL LOW (ref >60)
Glucose, Bld: 298 mg/dL — ABNORMAL HIGH (ref 70–99)
Potassium: 3.8 mmol/L (ref 3.5–5.1)
Sodium: 136 mmol/L (ref 135–145)

## 2021-04-28 LAB — COMPREHENSIVE METABOLIC PANEL
ALT: 21 U/L (ref 0–44)
AST: 28 U/L (ref 15–41)
Albumin: 4.8 g/dL (ref 3.5–5.0)
Alkaline Phosphatase: 92 U/L (ref 38–126)
Anion gap: 20 — ABNORMAL HIGH (ref 5–15)
BUN: 43 mg/dL — ABNORMAL HIGH (ref 8–23)
CO2: 15 mmol/L — ABNORMAL LOW (ref 22–32)
Calcium: 10.2 mg/dL (ref 8.9–10.3)
Chloride: 96 mmol/L — ABNORMAL LOW (ref 98–111)
Creatinine, Ser: 2.19 mg/dL — ABNORMAL HIGH (ref 0.61–1.24)
GFR, Estimated: 31 mL/min — ABNORMAL LOW (ref >60)
Glucose, Bld: 637 mg/dL (ref 70–99)
Potassium: 4.9 mmol/L (ref 3.5–5.1)
Sodium: 131 mmol/L — ABNORMAL LOW (ref 135–145)
Total Bilirubin: 2.1 mg/dL — ABNORMAL HIGH (ref 0.3–1.2)
Total Protein: 8.4 g/dL — ABNORMAL HIGH (ref 6.5–8.1)

## 2021-04-28 LAB — TROPONIN I (HIGH SENSITIVITY)
Troponin I (High Sensitivity): 405 ng/L (ref <18)
Troponin I (High Sensitivity): 378 ng/L (ref <18)
Troponin I (High Sensitivity): 319 ng/L (ref <18)

## 2021-04-28 LAB — ETHANOL: Alcohol, Ethyl (B): <10 mg/dL (ref <10)

## 2021-04-28 LAB — CBC WITH DIFFERENTIAL/PLATELET
Abs Immature Granulocytes: 0.06 10*3/uL (ref 0.00–0.07)
Basophils Absolute: 0 10*3/uL (ref 0.0–0.1)
Basophils Relative: 0 %
Eosinophils Absolute: 0 10*3/uL (ref 0.0–0.5)
Eosinophils Relative: 0 %
HCT: 47.1 % (ref 39.0–52.0)
Hemoglobin: 16 g/dL (ref 13.0–17.0)
Immature Granulocytes: 1 %
Lymphocytes Relative: 5 %
Lymphs Abs: 0.6 10*3/uL — ABNORMAL LOW (ref 0.7–4.0)
MCH: 30.8 pg (ref 26.0–34.0)
MCHC: 34 g/dL (ref 30.0–36.0)
MCV: 90.6 fL (ref 80.0–100.0)
Monocytes Absolute: 0.8 10*3/uL (ref 0.1–1.0)
Monocytes Relative: 7 %
Neutro Abs: 10.5 10*3/uL — ABNORMAL HIGH (ref 1.7–7.7)
Neutrophils Relative %: 87 %
Platelets: 134 10*3/uL — ABNORMAL LOW (ref 150–400)
RBC: 5.2 MIL/uL (ref 4.22–5.81)
RDW: 12.4 % (ref 11.5–15.5)
WBC: 12 10*3/uL — ABNORMAL HIGH (ref 4.0–10.5)
nRBC: 0 % (ref 0.0–0.2)

## 2021-04-28 LAB — MRSA NEXT GEN BY PCR, NASAL: MRSA by PCR Next Gen: NOT DETECTED

## 2021-04-28 LAB — CK: Total CK: 604 U/L — ABNORMAL HIGH (ref 49–397)

## 2021-04-28 LAB — BASIC METABOLIC PANEL WITH GFR
Anion gap: 18 — ABNORMAL HIGH (ref 5–15)
BUN: 42 mg/dL — ABNORMAL HIGH (ref 8–23)
CO2: 14 mmol/L — ABNORMAL LOW (ref 22–32)
Calcium: 9.2 mg/dL (ref 8.9–10.3)
Chloride: 98 mmol/L (ref 98–111)
Creatinine, Ser: 2.09 mg/dL — ABNORMAL HIGH (ref 0.61–1.24)
GFR, Estimated: 33 mL/min — ABNORMAL LOW
Glucose, Bld: 631 mg/dL (ref 70–99)
Potassium: 4.4 mmol/L (ref 3.5–5.1)
Sodium: 130 mmol/L — ABNORMAL LOW (ref 135–145)

## 2021-04-28 LAB — BLOOD GAS, VENOUS
Acid-base deficit: 10.4 mmol/L — ABNORMAL HIGH (ref 0.0–2.0)
Bicarbonate: 15.7 mmol/L — ABNORMAL LOW (ref 20.0–28.0)
O2 Saturation: 84.4 %
Patient temperature: 37
pCO2, Ven: 35 mmHg — ABNORMAL LOW (ref 44–60)
pH, Ven: 7.26 (ref 7.25–7.43)
pO2, Ven: 54 mmHg — ABNORMAL HIGH (ref 32–45)

## 2021-04-28 LAB — URINE DRUG SCREEN, QUALITATIVE (ARMC ONLY)
Amphetamines, Ur Screen: NOT DETECTED
Barbiturates, Ur Screen: NOT DETECTED
Benzodiazepine, Ur Scrn: NOT DETECTED
Cannabinoid 50 Ng, Ur ~~LOC~~: NOT DETECTED
Cocaine Metabolite,Ur ~~LOC~~: NOT DETECTED
MDMA (Ecstasy)Ur Screen: NOT DETECTED
Methadone Scn, Ur: NOT DETECTED
Opiate, Ur Screen: NOT DETECTED
Phencyclidine (PCP) Ur S: NOT DETECTED
Tricyclic, Ur Screen: NOT DETECTED

## 2021-04-28 LAB — LACTIC ACID, PLASMA
Lactic Acid, Venous: 2.1 mmol/L (ref 0.5–1.9)
Lactic Acid, Venous: 2.4 mmol/L (ref 0.5–1.9)
Lactic Acid, Venous: 2.5 mmol/L (ref 0.5–1.9)
Lactic Acid, Venous: 1.8 mmol/L (ref 0.5–1.9)

## 2021-04-28 LAB — CBG MONITORING, ED
Glucose-Capillary: 599 mg/dL (ref 70–99)
Glucose-Capillary: 458 mg/dL — ABNORMAL HIGH (ref 70–99)
Glucose-Capillary: 559 mg/dL (ref 70–99)
Glucose-Capillary: 355 mg/dL — ABNORMAL HIGH (ref 70–99)
Glucose-Capillary: 453 mg/dL — ABNORMAL HIGH (ref 70–99)
Glucose-Capillary: 351 mg/dL — ABNORMAL HIGH (ref 70–99)
Glucose-Capillary: 300 mg/dL — ABNORMAL HIGH (ref 70–99)
Glucose-Capillary: 248 mg/dL — ABNORMAL HIGH (ref 70–99)
Glucose-Capillary: 184 mg/dL — ABNORMAL HIGH (ref 70–99)

## 2021-04-28 LAB — LIPASE, BLOOD: Lipase: 25 U/L (ref 11–51)

## 2021-04-28 LAB — PHOSPHORUS: Phosphorus: 1.8 mg/dL — ABNORMAL LOW (ref 2.5–4.6)

## 2021-04-28 LAB — TSH: TSH: 1.047 u[IU]/mL (ref 0.350–4.500)

## 2021-04-28 LAB — BRAIN NATRIURETIC PEPTIDE: B Natriuretic Peptide: 253.5 pg/mL — ABNORMAL HIGH (ref 0.0–100.0)

## 2021-04-28 LAB — AMMONIA: Ammonia: 17 umol/L (ref 9–35)

## 2021-04-28 LAB — RESP PANEL BY RT-PCR (FLU A&B, COVID) ARPGX2
Influenza A by PCR: NEGATIVE
Influenza B by PCR: NEGATIVE
SARS Coronavirus 2 by RT PCR: NEGATIVE

## 2021-04-28 LAB — HEMOGLOBIN A1C
Hgb A1c MFr Bld: 10.5 % — ABNORMAL HIGH (ref 4.8–5.6)
Mean Plasma Glucose: 254.65 mg/dL

## 2021-04-28 LAB — PROTIME-INR
INR: 1 (ref 0.8–1.2)
Prothrombin Time: 13.1 seconds (ref 11.4–15.2)

## 2021-04-28 LAB — MAGNESIUM
Magnesium: 2.1 mg/dL (ref 1.7–2.4)
Magnesium: 2.2 mg/dL (ref 1.7–2.4)

## 2021-04-28 LAB — ACETAMINOPHEN LEVEL: Acetaminophen (Tylenol), Serum: <10 ug/mL — ABNORMAL LOW (ref 10–30)

## 2021-04-28 LAB — HIV ANTIBODY (ROUTINE TESTING W REFLEX): HIV Screen 4th Generation wRfx: NONREACTIVE

## 2021-04-28 LAB — OSMOLALITY: Osmolality: 318 mOsm/kg — ABNORMAL HIGH (ref 275–295)

## 2021-04-28 LAB — VITAMIN B12: Vitamin B-12: 206 pg/mL (ref 180–914)

## 2021-04-28 LAB — BETA-HYDROXYBUTYRIC ACID: Beta-Hydroxybutyric Acid: 7.04 mmol/L — ABNORMAL HIGH (ref 0.05–0.27)

## 2021-04-28 LAB — PROCALCITONIN: Procalcitonin: 0.33 ng/mL

## 2021-04-28 LAB — APTT: aPTT: 20 seconds — ABNORMAL LOW (ref 24–36)

## 2021-04-28 MED ORDER — MELATONIN 5 MG PO TABS
5.0000 mg | ORAL_TABLET | Freq: Every day | ORAL | Status: DC
  Administered 2021-04-29 – 2021-05-02 (×4): 5 mg via ORAL
  Filled 2021-04-28 (×4): qty 1

## 2021-04-28 MED ORDER — CYCLOSPORINE MODIFIED (GENGRAF) 25 MG PO CAPS
175.0000 mg | ORAL_CAPSULE | Freq: Every evening | ORAL | Status: DC
Start: 2021-04-28 — End: 2021-05-03
  Administered 2021-04-28 – 2021-05-02 (×5): 175 mg via ORAL
  Filled 2021-04-28 (×8): qty 7

## 2021-04-28 MED ORDER — METRONIDAZOLE 500 MG/100ML IV SOLN
500.0000 mg | Freq: Once | INTRAVENOUS | Status: AC
  Administered 2021-04-28: 500 mg via INTRAVENOUS
  Filled 2021-04-28: qty 100

## 2021-04-28 MED ORDER — SODIUM CHLORIDE 0.9 % IV SOLN
500.0000 mg | INTRAVENOUS | Status: DC
  Administered 2021-04-28: 500 mg via INTRAVENOUS
  Filled 2021-04-28 (×2): qty 5

## 2021-04-28 MED ORDER — SODIUM CHLORIDE 0.9 % IV SOLN
2.0000 g | INTRAVENOUS | Status: DC
  Administered 2021-04-29: 2 g via INTRAVENOUS
  Filled 2021-04-28 (×2): qty 20

## 2021-04-28 MED ORDER — PANTOPRAZOLE SODIUM 40 MG PO TBEC
40.0000 mg | DELAYED_RELEASE_TABLET | Freq: Two times a day (BID) | ORAL | Status: DC
Start: 2021-04-28 — End: 2021-05-03
  Administered 2021-04-29 – 2021-05-03 (×8): 40 mg via ORAL
  Filled 2021-04-28 (×9): qty 1

## 2021-04-28 MED ORDER — MIRTAZAPINE 15 MG PO TABS
15.0000 mg | ORAL_TABLET | Freq: Every day | ORAL | Status: DC
  Administered 2021-04-29 – 2021-05-02 (×4): 15 mg via ORAL
  Filled 2021-04-28 (×4): qty 1

## 2021-04-28 MED ORDER — ONDANSETRON HCL 4 MG PO TABS: 4.0000 mg | ORAL_TABLET | Freq: Four times a day (QID) | ORAL | Status: DC | PRN

## 2021-04-28 MED ORDER — LACTATED RINGERS IV SOLN: INTRAVENOUS | Status: AC

## 2021-04-28 MED ORDER — LACTATED RINGERS IV BOLUS (SEPSIS)
250.0000 mL | Freq: Once | INTRAVENOUS | Status: AC
  Administered 2021-04-28: 250 mL via INTRAVENOUS

## 2021-04-28 MED ORDER — SODIUM CHLORIDE 0.9 % IV SOLN
2.0000 g | Freq: Once | INTRAVENOUS | Status: AC
  Administered 2021-04-28: 2 g via INTRAVENOUS
  Filled 2021-04-28: qty 2

## 2021-04-28 MED ORDER — CYCLOSPORINE MODIFIED (NEORAL) 25 MG PO CAPS
200.0000 mg | ORAL_CAPSULE | Freq: Every day | ORAL | Status: DC
  Administered 2021-04-29 – 2021-05-03 (×5): 200 mg via ORAL
  Filled 2021-04-28: qty 8
  Filled 2021-04-28: qty 2
  Filled 2021-04-28 (×4): qty 8

## 2021-04-28 MED ORDER — VANCOMYCIN HCL IN DEXTROSE 1-5 GM/200ML-% IV SOLN
1000.0000 mg | Freq: Once | INTRAVENOUS | Status: AC
  Administered 2021-04-28: 1000 mg via INTRAVENOUS
  Filled 2021-04-28: qty 200

## 2021-04-28 MED ORDER — GUAIFENESIN ER 600 MG PO TB12
1200.0000 mg | ORAL_TABLET | Freq: Two times a day (BID) | ORAL | Status: DC
  Administered 2021-04-29 – 2021-05-03 (×9): 1200 mg via ORAL
  Filled 2021-04-28 (×9): qty 2

## 2021-04-28 MED ORDER — SILDENAFIL CITRATE 20 MG PO TABS
40.0000 mg | ORAL_TABLET | Freq: Three times a day (TID) | ORAL | Status: DC
  Administered 2021-04-28 – 2021-05-03 (×13): 40 mg via ORAL
  Filled 2021-04-28 (×16): qty 2

## 2021-04-28 MED ORDER — HYDRALAZINE HCL 20 MG/ML IJ SOLN: 5.0000 mg | Freq: Four times a day (QID) | INTRAMUSCULAR | Status: DC | PRN

## 2021-04-28 MED ORDER — ROSUVASTATIN CALCIUM 10 MG PO TABS
5.0000 mg | ORAL_TABLET | Freq: Every day | ORAL | Status: DC
  Filled 2021-04-28 (×2): qty 1

## 2021-04-28 MED ORDER — LACTATED RINGERS IV BOLUS (SEPSIS)
1000.0000 mL | Freq: Once | INTRAVENOUS | Status: AC
  Administered 2021-04-28: 1000 mL via INTRAVENOUS

## 2021-04-28 MED ORDER — BISACODYL 5 MG PO TBEC: 5.0000 mg | DELAYED_RELEASE_TABLET | Freq: Every day | ORAL | Status: DC | PRN

## 2021-04-28 MED ORDER — METOPROLOL SUCCINATE ER 25 MG PO TB24
25.0000 mg | ORAL_TABLET | Freq: Two times a day (BID) | ORAL | Status: DC
  Administered 2021-04-29 – 2021-05-01 (×5): 25 mg via ORAL
  Filled 2021-04-28 (×5): qty 1

## 2021-04-28 MED ORDER — PREGABALIN 75 MG PO CAPS
75.0000 mg | ORAL_CAPSULE | Freq: Two times a day (BID) | ORAL | Status: DC
  Administered 2021-04-29 – 2021-05-03 (×8): 75 mg via ORAL
  Filled 2021-04-28 (×8): qty 1

## 2021-04-28 MED ORDER — DULOXETINE HCL 30 MG PO CPEP
60.0000 mg | ORAL_CAPSULE | Freq: Every day | ORAL | Status: DC
  Administered 2021-04-29 – 2021-05-02 (×4): 60 mg via ORAL
  Filled 2021-04-28 (×4): qty 2

## 2021-04-28 MED ORDER — DEXTROSE 50 % IV SOLN: 0.0000 mL | INTRAVENOUS | Status: DC | PRN

## 2021-04-28 MED ORDER — HEPARIN SODIUM (PORCINE) 5000 UNIT/ML IJ SOLN
5000.0000 [IU] | Freq: Two times a day (BID) | INTRAMUSCULAR | Status: DC
  Administered 2021-04-28 – 2021-05-03 (×10): 5000 [IU] via SUBCUTANEOUS
  Filled 2021-04-28 (×9): qty 1

## 2021-04-28 MED ORDER — DEXTROSE IN LACTATED RINGERS 5 % IV SOLN: INTRAVENOUS | Status: DC

## 2021-04-28 MED ORDER — INSULIN REGULAR(HUMAN) IN NACL 100-0.9 UT/100ML-% IV SOLN
INTRAVENOUS | Status: DC
  Administered 2021-04-28: 9 [IU]/h via INTRAVENOUS
  Filled 2021-04-28: qty 100

## 2021-04-28 MED ORDER — AMIODARONE HCL 200 MG PO TABS
200.0000 mg | ORAL_TABLET | Freq: Every day | ORAL | Status: DC
Start: 2021-04-28 — End: 2021-05-03
  Administered 2021-04-29 – 2021-05-03 (×5): 200 mg via ORAL
  Filled 2021-04-28 (×5): qty 1

## 2021-04-28 MED ORDER — POTASSIUM CHLORIDE 10 MEQ/100ML IV SOLN
10.0000 meq | INTRAVENOUS | Status: AC
  Administered 2021-04-28 (×2): 10 meq via INTRAVENOUS
  Filled 2021-04-28 (×2): qty 100

## 2021-04-28 MED ORDER — LACTATED RINGERS IV SOLN: INTRAVENOUS | Status: DC

## 2021-04-28 MED FILL — CYCLOSPORINE MODIFIED 100 MG CAPSULE: ORAL | 30 days supply | Qty: 90 | Fill #0

## 2021-04-28 MED FILL — DULOXETINE 30 MG CAPSULE,DELAYED RELEASE: ORAL | 30 days supply | Qty: 60 | Fill #8

## 2021-04-28 MED FILL — MG-PLUS-PROTEIN 133 MG TABLET: ORAL | 30 days supply | Qty: 120 | Fill #1

## 2021-04-28 MED FILL — PREGABALIN 100 MG CAPSULE: 30 days supply | Qty: 60 | Fill #7

## 2021-04-28 MED FILL — SILDENAFIL (PULMONARY HYPERTENSION) 20 MG TABLET: ORAL | 30 days supply | Qty: 180 | Fill #3

## 2021-04-28 MED FILL — MYCOPHENOLATE MOFETIL 250 MG CAPSULE: ORAL | 30 days supply | Qty: 120 | Fill #3

## 2021-04-28 NOTE — ED Provider Notes (Signed)
I think it is patient approximately 1500.  Please see off conversion note for full details regarding patient's initial evaluation and assessment.  In brief he presents for assessment of altered mental status after being found down by family earlier today at home.  He has a history of liver placement diabetes and A-fib anticoagulated on Coumadin.  He is pending labs and CT head neck and chest x-ray.  He was given fluids and empiric antibiotics on arrival with concerns for possible sepsis.  CT head and C-spine interpreted by myself show no evidence of skull fracture, atrial hemorrhage, large ischemic lesion or acute C-spine injury.  I also reviewed radiology interpretation and agree with her findings of no acute process but some evidence of chronic appearing microvascular ischemic changes and atrophy.  Chest x-ray obtained interpreted by myself shows no evidence of focal consolidation, effusion, edema, pneumothorax or other clear process.  I also reviewed radiology interpretation and agree there are findings of no acute process.  ECG shows A-fib with a rate of 96 without evidence of acute ischemia.  CMP is remarkable for glucose of 637, bicarb of 15, creatinine of 2.19 compared to 2.158 months ago and anion gap of 20.  T. bili slightly elevated 2.1.  Lactic acid initially was 2.1.  COVID influenza PCR is negative.  CBC is remarkable for WBC count of 12,000 and no evidence of acute anemia.  Platelets are 134.  INR is unremarkable.  Lipase not consistent with acute pancreatitis.  VBG remarkable for pH of 7.26 with a PCO2 of 35 and bicarb of 15.7. Patient CMP was is concerning for DKA.  Beta hydroxybutyrate was sent in addition to IV fluids patient was started on insulin drip.  My assessment patient is able to follow commands but only able to answer basic yes/no questions and his name.  He is shivering quite a bit.  He is not obtunded and is protecting his airway.  He will be admitted to medicine service for  further evaluation and management.  .Critical Care Performed by: Lucrezia Starch, MD Authorized by: Lucrezia Starch, MD   Critical care provider statement:    Critical care time (minutes):  30   Critical care was necessary to treat or prevent imminent or life-threatening deterioration of the following conditions:  Endocrine crisis and metabolic crisis   Critical care was time spent personally by me on the following activities:  Development of treatment plan with patient or surrogate, discussions with consultants, evaluation of patient's response to treatment, examination of patient, ordering and review of laboratory studies, ordering and review of radiographic studies, ordering and performing treatments and interventions, pulse oximetry, re-evaluation of patient's condition and review of old charts    Medications  lactated ringers infusion ( Intravenous New Bag/Given 04/28/21 1607)  insulin regular, human (MYXREDLIN) 100 units/ 100 mL infusion (has no administration in time range)  lactated ringers infusion (has no administration in time range)  dextrose 5 % in lactated ringers infusion (has no administration in time range)  dextrose 50 % solution 0-50 mL (has no administration in time range)  potassium chloride 10 mEq in 100 mL IVPB (has no administration in time range)  lactated ringers bolus 1,000 mL (0 mLs Intravenous Stopped 04/28/21 1514)    And  lactated ringers bolus 1,000 mL (0 mLs Intravenous Stopped 04/28/21 1515)    And  lactated ringers bolus 250 mL (0 mLs Intravenous Stopped 04/28/21 1637)  ceFEPIme (MAXIPIME) 2 g in sodium chloride 0.9 % 100 mL  IVPB (0 g Intravenous Stopped 04/28/21 1520)  metroNIDAZOLE (FLAGYL) IVPB 500 mg (0 mg Intravenous Stopped 04/28/21 1603)  vancomycin (VANCOCIN) IVPB 1000 mg/200 mL premix (0 mg Intravenous Stopped 04/28/21 1556)      Lucrezia Starch, MD 04/28/21 [phone]

## 2021-04-28 NOTE — ED Notes (Signed)
With family leaving the bedside and the patients decreased level of alertness; Pts bed alarm set.

## 2021-04-28 NOTE — Consult Note (Signed)
CODE SEPSIS - PHARMACY COMMUNICATION  **Broad Spectrum Antibiotics should be administered within 1 hour of Sepsis diagnosis**  Time Code Sepsis Called/Page Received: 9532  Antibiotics Ordered: 0233  Time of 1st antibiotic administration: [place] Hadleigh Felber ,PharmD Clinical Pharmacist  04/28/2021  3:05 PM

## 2021-04-28 NOTE — H&P (Signed)
History and Physical    Hector Mueller WCH:[id number] DOB: October 09, 1950 DOA: 04/28/2021  PCP: [name], MD (Confirm with patient/family/NH records and if not entered, this has to be entered at [facility] point of entry) Patient coming from: Home  I have personally briefly reviewed patient's old medical records in [place]  Chief Complaint: Patient confused.  HPI: Hector Mueller is a 71 y.o. male with medical history significant of IIDM, CKD stage IIIb, hep C status post liver transplant, chronic diastolic CHF, A-fib on Coumadin, pulmonary hypertension, was found by family member on the floor and confused and sent to ED.  Patient confused and very lethargic, all history provided by patient brother-in-law at bedside.  Patient widowed after his wife died in 14-Mar-2021.  Brother-in-law's family called him every other day to check out a status with grandson visited him occasionally.  Patient was sleeping around 10 PM yesterday milligrams and visited him.  Brother-in-law called him on Sunday and everything appears to be baseline.  This morning, father went to visit him and found him very probably had to hold all stool to go to bathroom.  Soon patient was found on the floor by the bathroom with urine and incontinent stool. Patient denied any pain by shaking his head. But unable to answer any other specific questions.  Brother-in-law heard patient as a new cough sounds gurgly.  She further reported patient has a history of " some kind of esophageal narrowing, need to be stretched before" but denied patient ever had aspiration pneumonia before  ED Course: Low-grade temp 99.6, blood pressure elevated.  No tachycardia no hypoxia.  Glucose> 600, creatinine 2.19 compared to baseline 2.15, WBC 12, bicarb 15, K4.9.  INR 1.0.  Strain negative for acute infiltrates.  CT head and neck negative for fracture or dislocation.  Received vancomycin cefepime and Flagyl in the ED and insulin drip started.   Received a total of 2.5 L IV bolus.  Review of Systems: Unable to perform, patient confused and unable to talk.  Past Medical History:  Diagnosis Date   Atrial fibrillation (HCC)    BPH (benign prostatic hyperplasia)    Cancer (HCC)    liver   CHF (congestive heart failure) (HCC)    Chronic kidney disease    Cirrhosis ([place])    Diabetes mellitus without complication (HCC)    GERD (gastroesophageal reflux disease)    Hepatitis C    Hx of liver transplant ([place])    Pulmonary HTN ([place])     Past Surgical History:  Procedure Laterality Date   ANKLE FRACTURE SURGERY Bilateral    BACK SURGERY     CLAVICLE SURGERY     ESOPHAGOGASTRODUODENOSCOPY N/A 08/19/2020   Procedure: ESOPHAGOGASTRODUODENOSCOPY (EGD);  Surgeon: Virgel Manifold, MD;  Location: [facility] ENDOSCOPY;  Service: Endoscopy;  Laterality: N/A;   INTRAMEDULLARY (IM) NAIL INTERTROCHANTERIC Right 11/28/2017   Procedure: INTRAMEDULLARY (IM) NAIL INTERTROCHANTRIC;  Surgeon: Earnestine Leys, MD;  Location: [facility] ORS;  Service: Orthopedics;  Laterality: Right;   LIVER SURGERY       reports that he has never smoked. He has never used smokeless tobacco. He reports that he does not drink alcohol and does not use drugs.  No Known Allergies  No family history on file.   Prior to Admission medications   Medication Sig Start Date End Date Taking? Authorizing Provider  alendronate (FOSAMAX) 70 MG tablet Take 70 mg by mouth once a week. Take with a full glass of water on an empty  stomach.    [provider]  amiodarone (PACERONE) 200 MG tablet Take 1 tablet (200 mg total) by mouth 2 (two) times daily for 7 days, THEN 1 tablet (200 mg total) daily. 07/21/20 08/27/20  Sidney Ace, MD  bisacodyl (DULCOLAX) 5 MG EC tablet Take 5 mg by mouth daily as needed for moderate constipation.    [provider]  Cholecalciferol (VITAMIN D3) 125 MCG (5000 UT) CAPS Take 3,000 Units by mouth daily.    [provider]   cycloSPORINE modified (NEORAL) 100 MG capsule Take 200 mg by mouth daily.    [provider]  cycloSPORINE modified (NEORAL) 25 MG capsule Take 175 mg by mouth every evening.    [provider]  DULoxetine (CYMBALTA) 30 MG capsule Take 60 mg by mouth at bedtime. 05/22/20   [provider]  Melatonin 3 MG TABS Take 3 mg by mouth at bedtime. 02/06/17   [provider]  metoprolol succinate (TOPROL-XL) 25 MG 24 hr tablet Take 1 tablet (25 mg total) by mouth 2 (two) times daily. 07/20/20 08/19/20  Sidney Ace, MD  mirtazapine (REMERON) 15 MG tablet Take 15 mg by mouth at bedtime.  10/18/17   [provider]  ondansetron (ZOFRAN) 4 MG tablet Take 1 tablet (4 mg total) by mouth every 6 (six) hours as needed for nausea. 12/01/17   Vaughan Basta, MD  OZEMPIC, 1 MG/DOSE, 4 MG/3ML SOPN Inject 1 mg into the skin once a week. 05/19/20   [provider]  pantoprazole (PROTONIX) 40 MG tablet Take 1 tablet (40 mg total) by mouth 2 (two) times daily. 08/20/20   Max Sane, MD  pregabalin (LYRICA) 75 MG capsule Take 75 mg by mouth in the morning and at bedtime. 05/15/19   [provider]  rosuvastatin (CRESTOR) 5 MG tablet Take 5 mg by mouth daily. 06/09/20   [provider]  sildenafil (REVATIO) 20 MG tablet Take 40 mg by mouth 3 (three) times daily.  10/17/17   [provider]  Specialty Vitamins Products (MAGNESIUM, AMINO ACID CHELATE,) 133 MG tablet Take 2 tablets by mouth 2 (two) times daily. 03/02/17   [provider]  Tacrine Hydrochloride (COGNEX PO) Take 2 capsules by mouth daily at 6 (six) AM. With a glass of water    [provider]  warfarin (COUMADIN) 2.5 MG tablet Take 1 tablet (2.5 mg total) by mouth daily. 08/20/20 09/19/20  Max Sane, MD    Physical Exam: Vitals:   04/28/21 1500 04/28/21 1615 04/28/21 1700 04/28/21 1715  BP: (!) 145/88 (!) 142/75 (!) 163/95 (!) 155/79  Pulse: 99 92 95 90  Resp:  19 (!) 21 (!) 23 17  Temp:      TempSrc:      SpO2: 98% 98% 99% 96%  Weight:      Height:        Constitutional: NAD, calm, comfortable Vitals:   04/28/21 1500 04/28/21 1615 04/28/21 1700 04/28/21 1715  BP: (!) 145/88 (!) 142/75 (!) 163/95 (!) 155/79  Pulse: 99 92 95 90  Resp: 19 (!) 21 (!) 23 17  Temp:      TempSrc:      SpO2: 98% 98% 99% 96%  Weight:      Height:       Eyes: PERRL, lids and conjunctivae normal ENMT: Mucous membranes are dry. Posterior pharynx clear of any exudate or lesions.Normal dentition.  Neck: normal, supple, no masses, no thyromegaly Respiratory: clear to auscultation bilaterally,  no wheezing, coarse crackles on right lung base. Normal respiratory effort. No accessory muscle use.  Cardiovascular: Regular rate and rhythm, no murmurs / rubs / gallops. No extremity edema. 2+ pedal pulses. No carotid bruits.  Abdomen: no tenderness, no masses palpated. No hepatosplenomegaly. Bowel sounds positive.  Musculoskeletal: no clubbing / cyanosis. No joint deformity upper and lower extremities. Good ROM, no contractures. Normal muscle tone.  Skin: no rashes, lesions, ulcers. No induration Neurologic: No facial droops, moving all limbs.  Opens eyes to light touch and voice.  Not following commands.  Normal muscle tone.  Episodes of tremors subsided by its own.  Skin hot to touch. Psychiatric: Opens eyes to voice and light touch, confused.    Labs on Admission: I have personally reviewed following labs and imaging studies  CBC: Recent Labs  Lab 04/28/21 1442  WBC 12.0*  NEUTROABS 10.5*  HGB 16.0  HCT 47.1  MCV 90.6  PLT 536*   Basic Metabolic Panel: Recent Labs  Lab 04/28/21 1442 04/28/21 1711  NA 131* 130*  K 4.9 4.4  CL 96* 98  CO2 15* 14*  GLUCOSE 637* 631*  BUN 43* 42*  CREATININE 2.19* 2.09*  CALCIUM 10.2 9.2   GFR: Estimated Creatinine Clearance: 31.5 mL/min (A) (by C-G formula based on SCr of 2.09 mg/dL (H)). Liver Function Tests: Recent  Labs  Lab 04/28/21 1442  AST 28  ALT 21  ALKPHOS 92  BILITOT 2.1*  PROT 8.4*  ALBUMIN 4.8   Recent Labs  Lab 04/28/21 1442  LIPASE 25   Recent Labs  Lab 04/28/21 1619  AMMONIA 17   Coagulation Profile: Recent Labs  Lab 04/28/21 1442  INR 1.0   Cardiac Enzymes: No results for input(s): CKTOTAL, CKMB, CKMBINDEX, TROPONINI in the last 168 hours. BNP (last 3 results) No results for input(s): PROBNP in the last 8760 hours. HbA1C: No results for input(s): HGBA1C in the last 72 hours. CBG: Recent Labs  Lab 04/28/21 1701 04/28/21 1742  GLUCAP 599* 559*   Lipid Profile: No results for input(s): CHOL, HDL, LDLCALC, TRIG, CHOLHDL, LDLDIRECT in the last 72 hours. Thyroid Function Tests: Recent Labs    04/28/21 1711  TSH 1.047   Anemia Panel: No results for input(s): VITAMINB12, FOLATE, FERRITIN, TIBC, IRON, RETICCTPCT in the last 72 hours. Urine analysis:    Component Value Date/Time   COLORURINE STRAW (A) 04/28/2021 1619   APPEARANCEUR CLEAR (A) 04/28/2021 1619   LABSPEC 1.023 04/28/2021 1619   PHURINE 5.0 04/28/2021 1619   GLUCOSEU >=500 (A) 04/28/2021 1619   HGBUR MODERATE (A) 04/28/2021 1619   BILIRUBINUR NEGATIVE 04/28/2021 1619   KETONESUR 20 (A) 04/28/2021 1619   PROTEINUR 100 (A) 04/28/2021 1619   NITRITE NEGATIVE 04/28/2021 1619   LEUKOCYTESUR NEGATIVE 04/28/2021 1619    Radiological Exams on Admission: CT Head Wo Contrast  Result Date: 04/28/2021 CLINICAL DATA:  Fall.  Mental status change EXAM: CT HEAD WITHOUT CONTRAST CT CERVICAL SPINE WITHOUT CONTRAST TECHNIQUE: Multidetector CT imaging of the head and cervical spine was performed following the standard protocol without intravenous contrast. Multiplanar CT image reconstructions of the cervical spine were also generated. RADIATION DOSE REDUCTION: This exam was performed according to the departmental dose-optimization program which includes automated exposure control, adjustment of the mA and/or kV  according to patient size and/or use of iterative reconstruction technique. COMPARISON:  CT head 11/27/2017 FINDINGS: CT HEAD FINDINGS Brain: Generalized atrophy. Mild to moderate white matter hypodensity bilaterally, chronic and unchanged. Negative for acute infarct, hemorrhage,  mass Vascular: Negative for hyperdense vessel Skull: Negative Sinuses/Orbits: Mild mucosal edema paranasal sinuses. Bilateral cataract extraction Other: None CT CERVICAL SPINE FINDINGS Alignment: Mild retrolisthesis C3-4 and C4-5 Skull base and vertebrae: Negative for fracture Soft tissues and spinal canal: Negative for soft tissue mass or adenopathy. Disc levels: Disc degeneration and spurring is present in the cervical spine. There is moderate foraminal narrowing bilaterally at C4-5 and C5-6 due to spurring mild spinal stenosis at C4-5. Upper chest: Lung apices clear bilaterally Other: None IMPRESSION: 1. No acute intracranial abnormality. Atrophy with chronic microvascular ischemia 2. Negative for cervical fracture. Electronically Signed   By: Franchot Gallo M.D.   On: 04/28/2021 15:25   CT Cervical Spine Wo Contrast  Result Date: 04/28/2021 CLINICAL DATA:  Fall.  Mental status change EXAM: CT HEAD WITHOUT CONTRAST CT CERVICAL SPINE WITHOUT CONTRAST TECHNIQUE: Multidetector CT imaging of the head and cervical spine was performed following the standard protocol without intravenous contrast. Multiplanar CT image reconstructions of the cervical spine were also generated. RADIATION DOSE REDUCTION: This exam was performed according to the departmental dose-optimization program which includes automated exposure control, adjustment of the mA and/or kV according to patient size and/or use of iterative reconstruction technique. COMPARISON:  CT head 11/27/2017 FINDINGS: CT HEAD FINDINGS Brain: Generalized atrophy. Mild to moderate white matter hypodensity bilaterally, chronic and unchanged. Negative for acute infarct, hemorrhage, mass  Vascular: Negative for hyperdense vessel Skull: Negative Sinuses/Orbits: Mild mucosal edema paranasal sinuses. Bilateral cataract extraction Other: None CT CERVICAL SPINE FINDINGS Alignment: Mild retrolisthesis C3-4 and C4-5 Skull base and vertebrae: Negative for fracture Soft tissues and spinal canal: Negative for soft tissue mass or adenopathy. Disc levels: Disc degeneration and spurring is present in the cervical spine. There is moderate foraminal narrowing bilaterally at C4-5 and C5-6 due to spurring mild spinal stenosis at C4-5. Upper chest: Lung apices clear bilaterally Other: None IMPRESSION: 1. No acute intracranial abnormality. Atrophy with chronic microvascular ischemia 2. Negative for cervical fracture. Electronically Signed   By: Franchot Gallo M.D.   On: 04/28/2021 15:25   DG Chest Port 1 View  Result Date: 04/28/2021 CLINICAL DATA:  Fall EXAM: PORTABLE CHEST 1 VIEW COMPARISON:  Chest radiograph 08/10/2020 FINDINGS: The cardiomediastinal silhouette is stable. A loop recorder is again seen. There is no focal consolidation or pulmonary edema. There is no pleural effusion or pneumothorax. Spinal fusion hardware is partially imaged. There is no acute osseous abnormality. IMPRESSION: Stable chest with no radiographic evidence of acute cardiopulmonary process. Electronically Signed   By: Valetta Mole M.D.   On: 04/28/2021 15:05    EKG: Independently reviewed.  Sinus, normal QRS, Qtc= 487.  Assessment/Plan Principal Problem:   Sepsis ([place])  (please populate well all problems here in Problem List. (For example, if patient is on BP meds at home and you resume or decide to hold them, it is a problem that needs to be her. Same for CAD, COPD, HLD and so on)  Acute metabolic encephalopathy -Likely from HHS and sepsis. -Check UDS, TSH and B12, ammonia level normal  Sepsis -Evidenced by leukocytosis, elevated lactate, with symptoms and signs of endorgan damage of acute encephalopathy. -Source less  clear.  UA showed no signs of pyuria.  He does have a new cough appears to be productive and complaint with a low fever.  But no significant hypoxia or x-ray changes.  Clinically suspect pneumonia/aspiration pneumonitis.  Discussed with pharmacy, patient has baseline CKD stage III and received vancomycin, will not redose vancomycin tonight.  Given no significant skin lesions, low suspicion for Pseudomonas infection.  De-escalate antibiotics to ceftriaxone for now. -Continue maintenance IV fluid. -Other Ddx, patient has tremors but muscle tone is normal, no high prophylaxis, thus less suspicious for serotonin syndrome.  Meningeal signs negative, low suspicious for meningitis/encephalitis. -Consider further brain MRI if mentation no significant improvement after glucose normalized and sepsis sufficiently covered.  Question of aspiration pneumonia -As discussed above, patient also has has history of esophageal stenosis -Treating as aspiration pneumonia, speech evaluation tomorrow.  For now.  HHS -Continue IV fluid and insulin drip.  PAF -In sinus rhythm, INR normal, patient probably noncompliant with Coumadin.  Consult pharmacy to restart Coumadin, DVT prophylaxis for now. -Continue amiodarone and metoprolol  Liver transplant -Check cyclosporine level, continue cyclosporine for now.  Anxiety/depression -He is on a fair dose of Cymbalta, EKG shows normal QRS and normal Qtc.  CKD stage IIIb -Severe dehydrated, continue IV fluid.  HTN, uncontrolled -As needed IV hydralazine, resume home BP meds once able to take p.o.  DVT prophylaxis: Heparin subcu Code Status: Full code Family Communication: Brother-in-law at bedside Disposition Plan: Patient is sick with sepsis and HHS, need insulin drip IV antibiotics, expect more than 2 midnight hospital stay. Consults called: NOne Admission status: PCU   Lequita Halt MD [facility] Pager [phone]  04/28/2021, 6:16 PM

## 2021-04-28 NOTE — ED Provider Notes (Signed)
[facility] Provider Note    Event Date/Time   First MD Initiated Contact with Patient 04/28/21 1441     (approximate)   History   Altered Mental Status   HPI Hector Mueller is a 71 y.o. male with past medical history of liver transplant who presents via EMS after being found down at home after a presumed fall.  Patient arrives with a GCS of 10 and shaking activity throughout.  Patient can only nod his head yes or no to questions.  EMS note the patient was using his left arm but has not been moving any other extremity.     Physical Exam   Triage Vital Signs: ED Triage Vitals  Enc Vitals Group     BP 04/28/21 1437 (!) 217/192     Pulse Rate 04/28/21 1437 (!) 103     Resp 04/28/21 1441 (!) 21     Temp 04/28/21 1437 99.6 F (37.6 C)     Temp Source 04/28/21 1437 Rectal     SpO2 04/28/21 1429 98 %     Weight 04/28/21 1438 160 lb (72.6 kg)     Height 04/28/21 1438 5\' 9"  (1.753 m)     Head Circumference --      Peak Flow --      Pain Score --      Pain Loc --      Pain Edu? --      Excl. in [place]? --     Most recent vital signs: Vitals:   04/29/21 1400 04/29/21 1500  BP: 140/71 115/61  Pulse: 68 66  Resp: 19 20  Temp:    SpO2: 100% 99%    General: Awake, GCS 11 (C9O7S9) CV:  Good peripheral perfusion.  Resp:  Normal effort.  Abd:  No distention.  Other:  Elderly Caucasian male laying in bed tremulous but tracking with eyes   ED Results / Procedures / Treatments   Labs (all labs ordered are listed, but only abnormal results are displayed) Labs Reviewed  LACTIC ACID, PLASMA - Abnormal; Notable for the following components:      Result Value   Lactic Acid, Venous 2.1 (*)    All other components within normal limits  LACTIC ACID, PLASMA - Abnormal; Notable for the following components:   Lactic Acid, Venous 2.4 (*)    All other components within normal limits  COMPREHENSIVE METABOLIC PANEL - Abnormal; Notable for the following  components:   Sodium 131 (*)    Chloride 96 (*)    CO2 15 (*)    Glucose, Bld 637 (*)    BUN 43 (*)    Creatinine, Ser 2.19 (*)    Total Protein 8.4 (*)    Total Bilirubin 2.1 (*)    GFR, Estimated 31 (*)    Anion gap 20 (*)    All other components within normal limits  CBC WITH DIFFERENTIAL/PLATELET - Abnormal; Notable for the following components:   WBC 12.0 (*)    Platelets 134 (*)    Neutro Abs 10.5 (*)    Lymphs Abs 0.6 (*)    All other components within normal limits  APTT - Abnormal; Notable for the following components:   aPTT 20 (*)    All other components within normal limits  URINALYSIS, COMPLETE (UACMP) WITH MICROSCOPIC - Abnormal; Notable for the following components:   Color, Urine STRAW (*)    APPearance CLEAR (*)    Glucose, UA >=500 (*)    Hgb  urine dipstick MODERATE (*)    Ketones, ur 20 (*)    Protein, ur 100 (*)    All other components within normal limits  BETA-HYDROXYBUTYRIC ACID - Abnormal; Notable for the following components:   Beta-Hydroxybutyric Acid 7.04 (*)    All other components within normal limits  BLOOD GAS, VENOUS - Abnormal; Notable for the following components:   pCO2, Ven 35 (*)    pO2, Ven 54.0 (*)    Bicarbonate 15.7 (*)    Acid-base deficit 10.4 (*)    All other components within normal limits  BASIC METABOLIC PANEL - Abnormal; Notable for the following components:   Sodium 130 (*)    CO2 14 (*)    Glucose, Bld 631 (*)    BUN 42 (*)    Creatinine, Ser 2.09 (*)    GFR, Estimated 33 (*)    Anion gap 18 (*)    All other components within normal limits  BASIC METABOLIC PANEL - Abnormal; Notable for the following components:   CO2 20 (*)    Glucose, Bld 298 (*)    BUN 41 (*)    Creatinine, Ser 1.97 (*)    GFR, Estimated 35 (*)    All other components within normal limits  BASIC METABOLIC PANEL - Abnormal; Notable for the following components:   Glucose, Bld 210 (*)    BUN 39 (*)    Creatinine, Ser 2.04 (*)    GFR, Estimated  34 (*)    All other components within normal limits  BASIC METABOLIC PANEL - Abnormal; Notable for the following components:   Potassium 3.4 (*)    Glucose, Bld 227 (*)    BUN 40 (*)    Creatinine, Ser 2.06 (*)    GFR, Estimated 33 (*)    All other components within normal limits  BETA-HYDROXYBUTYRIC ACID - Abnormal; Notable for the following components:   Beta-Hydroxybutyric Acid >8.00 (*)    All other components within normal limits  BETA-HYDROXYBUTYRIC ACID - Abnormal; Notable for the following components:   Beta-Hydroxybutyric Acid 0.54 (*)    All other components within normal limits  HEMOGLOBIN A1C - Abnormal; Notable for the following components:   Hgb A1c MFr Bld 10.5 (*)    All other components within normal limits  LACTIC ACID, PLASMA - Abnormal; Notable for the following components:   Lactic Acid, Venous 2.5 (*)    All other components within normal limits  ACETAMINOPHEN LEVEL - Abnormal; Notable for the following components:   Acetaminophen (Tylenol), Serum <10 (*)    All other components within normal limits  OSMOLALITY - Abnormal; Notable for the following components:   Osmolality 318 (*)    All other components within normal limits  BRAIN NATRIURETIC PEPTIDE - Abnormal; Notable for the following components:   B Natriuretic Peptide 253.5 (*)    All other components within normal limits  CK - Abnormal; Notable for the following components:   Total CK 604 (*)    All other components within normal limits  PHOSPHORUS - Abnormal; Notable for the following components:   Phosphorus 1.8 (*)    All other components within normal limits  BASIC METABOLIC PANEL - Abnormal; Notable for the following components:   Glucose, Bld 201 (*)    BUN 41 (*)    Creatinine, Ser 2.11 (*)    GFR, Estimated 32 (*)    All other components within normal limits  BETA-HYDROXYBUTYRIC ACID - Abnormal; Notable for the following components:   Beta-Hydroxybutyric  Acid 0.85 (*)    All other  components within normal limits  GLUCOSE, CAPILLARY - Abnormal; Notable for the following components:   Glucose-Capillary 207 (*)    All other components within normal limits  GLUCOSE, CAPILLARY - Abnormal; Notable for the following components:   Glucose-Capillary 281 (*)    All other components within normal limits  GLUCOSE, CAPILLARY - Abnormal; Notable for the following components:   Glucose-Capillary 148 (*)    All other components within normal limits  GLUCOSE, CAPILLARY - Abnormal; Notable for the following components:   Glucose-Capillary 146 (*)    All other components within normal limits  CBG MONITORING, ED - Abnormal; Notable for the following components:   Glucose-Capillary 599 (*)    All other components within normal limits  CBG MONITORING, ED - Abnormal; Notable for the following components:   Glucose-Capillary 559 (*)    All other components within normal limits  CBG MONITORING, ED - Abnormal; Notable for the following components:   Glucose-Capillary 458 (*)    All other components within normal limits  CBG MONITORING, ED - Abnormal; Notable for the following components:   Glucose-Capillary 453 (*)    All other components within normal limits  CBG MONITORING, ED - Abnormal; Notable for the following components:   Glucose-Capillary 355 (*)    All other components within normal limits  CBG MONITORING, ED - Abnormal; Notable for the following components:   Glucose-Capillary 351 (*)    All other components within normal limits  CBG MONITORING, ED - Abnormal; Notable for the following components:   Glucose-Capillary 300 (*)    All other components within normal limits  CBG MONITORING, ED - Abnormal; Notable for the following components:   Glucose-Capillary 248 (*)    All other components within normal limits  CBG MONITORING, ED - Abnormal; Notable for the following components:   Glucose-Capillary 184 (*)    All other components within normal limits  CBG MONITORING,  ED - Abnormal; Notable for the following components:   Glucose-Capillary 408 (*)    All other components within normal limits  CBG MONITORING, ED - Abnormal; Notable for the following components:   Glucose-Capillary 173 (*)    All other components within normal limits  CBG MONITORING, ED - Abnormal; Notable for the following components:   Glucose-Capillary 229 (*)    All other components within normal limits  CBG MONITORING, ED - Abnormal; Notable for the following components:   Glucose-Capillary 227 (*)    All other components within normal limits  CBG MONITORING, ED - Abnormal; Notable for the following components:   Glucose-Capillary 232 (*)    All other components within normal limits  CBG MONITORING, ED - Abnormal; Notable for the following components:   Glucose-Capillary 361 (*)    All other components within normal limits  CBG MONITORING, ED - Abnormal; Notable for the following components:   Glucose-Capillary 173 (*)    All other components within normal limits  CBG MONITORING, ED - Abnormal; Notable for the following components:   Glucose-Capillary 195 (*)    All other components within normal limits  TROPONIN I (HIGH SENSITIVITY) - Abnormal; Notable for the following components:   Troponin I (High Sensitivity) 405 (*)    All other components within normal limits  TROPONIN I (HIGH SENSITIVITY) - Abnormal; Notable for the following components:   Troponin I (High Sensitivity) 378 (*)    All other components within normal limits  TROPONIN I (HIGH SENSITIVITY) - Abnormal;  Notable for the following components:   Troponin I (High Sensitivity) 319 (*)    All other components within normal limits  RESP PANEL BY RT-PCR (FLU A&B, COVID) ARPGX2  CULTURE, BLOOD (ROUTINE X 2)  CULTURE, BLOOD (ROUTINE X 2)  MRSA NEXT GEN BY PCR, NASAL  URINE CULTURE  PROTIME-INR  AMMONIA  LIPASE, BLOOD  TSH  HIV ANTIBODY (ROUTINE TESTING W REFLEX)  PROCALCITONIN  PROCALCITONIN  LACTIC ACID,  PLASMA  ETHANOL  URINE DRUG SCREEN, QUALITATIVE ([facility] ONLY)  MAGNESIUM  VITAMIN B12  STREP PNEUMONIAE URINARY ANTIGEN  MAGNESIUM  CYCLOSPORINE  LEGIONELLA PNEUMOPHILA SEROGP 1 UR AG  MYCOPLASMA PNEUMONIAE ANTIBODY, IGM     EKG ED ECG REPORT I, Naaman Plummer, the attending physician, personally viewed and interpreted this ECG.  Date: 04/28/2021 EKG Time: 1444 Rate: 96 Rhythm: normal sinus rhythm QRS Axis: normal Intervals: normal ST/T Wave abnormalities: normal Narrative Interpretation: no evidence of acute ischemia  PROCEDURES:  Critical Care performed: No  .1-3 Lead EKG Interpretation Performed by: Naaman Plummer, MD Authorized by: Naaman Plummer, MD     Interpretation: abnormal     ECG rate:  112   ECG rate assessment: tachycardic     Rhythm: sinus tachycardia     Ectopy: none     Conduction: normal     MEDICATIONS ORDERED IN ED: Medications  lactated ringers infusion (0 mLs Intravenous Paused 04/28/21 1709)  dextrose 50 % solution 0-50 mL (has no administration in time range)  heparin injection 5,000 Units (5,000 Units Subcutaneous Given 04/29/21 1316)  amiodarone (PACERONE) tablet 200 mg (200 mg Oral Given 04/29/21 1316)  metoprolol succinate (TOPROL-XL) 24 hr tablet 25 mg (25 mg Oral Given 04/29/21 1322)  sildenafil (REVATIO) tablet 40 mg (40 mg Oral Not Given 04/29/21 1000)  DULoxetine (CYMBALTA) DR capsule 60 mg (0 mg Oral Hold 04/28/21 2214)  mirtazapine (REMERON) tablet 15 mg (0 mg Oral Hold 04/28/21 2215)  bisacodyl (DULCOLAX) EC tablet 5 mg (has no administration in time range)  ondansetron (ZOFRAN) tablet 4 mg (has no administration in time range)  pantoprazole (PROTONIX) EC tablet 40 mg (40 mg Oral Not Given 04/29/21 1000)  cycloSPORINE modified (NEORAL) capsule 200 mg (has no administration in time range)  cycloSPORINE modified (NEORAL) capsule 175 mg (175 mg Oral Given 04/28/21 2135)  melatonin tablet 5 mg (0 mg Oral Hold 04/28/21 2215)  pregabalin  (LYRICA) capsule 75 mg (75 mg Oral Not Given 04/29/21 1000)  hydrALAZINE (APRESOLINE) injection 5 mg (has no administration in time range)  cefTRIAXone (ROCEPHIN) 2 g in sodium chloride 0.9 % 100 mL IVPB (0 g Intravenous Stopped 04/29/21 0356)  guaiFENesin (MUCINEX) 12 hr tablet 1,200 mg (1,200 mg Oral Given 04/29/21 1316)  azithromycin (ZITHROMAX) 500 mg in sodium chloride 0.9 % 250 mL IVPB (0 mg Intravenous Stopped 04/28/21 2139)  insulin glargine-yfgn (SEMGLEE) injection 20 Units (20 Units Subcutaneous Given 04/29/21 0942)  Chlorhexidine Gluconate Cloth 2 % PADS 6 each (6 each Topical Given 04/29/21 0942)  chlorhexidine (PERIDEX) 0.12 % solution 15 mL (15 mLs Mouth Rinse Given 04/29/21 1316)  MEDLINE mouth rinse (15 mLs Mouth Rinse Given 04/29/21 1200)  insulin aspart (novoLOG) injection 0-9 Units (1 Units Subcutaneous Given 04/29/21 1300)  insulin aspart (novoLOG) injection 0-5 Units (has no administration in time range)  lactated ringers bolus 1,000 mL (0 mLs Intravenous Stopped 04/28/21 1514)    And  lactated ringers bolus 1,000 mL (0 mLs Intravenous Stopped 04/28/21 1515)    And  lactated ringers bolus 250 mL (0 mLs Intravenous Stopped 04/28/21 1637)  ceFEPIme (MAXIPIME) 2 g in sodium chloride 0.9 % 100 mL IVPB (0 g Intravenous Stopped 04/28/21 1520)  metroNIDAZOLE (FLAGYL) IVPB 500 mg (0 mg Intravenous Stopped 04/28/21 1603)  vancomycin (VANCOCIN) IVPB 1000 mg/200 mL premix (0 mg Intravenous Stopped 04/28/21 1556)  potassium chloride 10 mEq in 100 mL IVPB (0 mEq Intravenous Stopped 04/28/21 1924)     IMPRESSION / MDM / [place] / ED COURSE  I reviewed the triage vital signs and the nursing notes.                              Differential diagnosis includes, but is not limited to, intracranial hemorrhage, CVA, seizure, DKA, sepsis  The patient is on the cardiac monitor to evaluate for evidence of arrhythmia and/or significant heart rate changes.  Patient is 71 year old male that  presents via EMS after being found down at home for unknown cause.  Patient shows no significant external signs of trauma.  Patient arrives tachycardic and IV fluids started.  Initial laboratory evaluation and radiologic evaluation pending at the end of my shift.  Care of this patient will be signed out to the oncoming physician at the end of my shift.  All pertinent patient information conveyed and all questions answered.  All further care and disposition decisions will be made by the oncoming physician.      FINAL CLINICAL IMPRESSION(S) / ED DIAGNOSES   Final diagnoses:  Diabetic ketoacidosis without coma associated with type 2 diabetes mellitus ([place])  Fall, initial encounter  SIRS (systemic inflammatory response syndrome) ([place])     Rx / DC Orders   ED Discharge Orders     None        Note:  This document was prepared using Dragon voice recognition software and may include unintentional dictation errors.   Naaman Plummer, MD 04/29/21 1537

## 2021-04-28 NOTE — Sepsis Progress Note (Signed)
Notified provider of need to order another repeat lactic acid as the second was higher than the first. ° °

## 2021-04-28 NOTE — ED Notes (Signed)
Provider notified about the Endotool recommendations.

## 2021-04-28 NOTE — ED Triage Notes (Signed)
Pt BIB EMS from home, initial call was for a fall. Pt was found by the bathroom doorway covered with stool and urine. Per family, normally alert/ oriented and ambulatory. Pt wasn't feeling well the day before yesterday, talked to pt last night around 10PM and pt was "acting normal then". Per EMS, pt lives with a grandson but was not there when EMS arrived. Noted tremors, pt able to follow commands, not talking. H/O Diabetes, Liver transplant, unknown if on anticoagulant.  Dr. Cheri Fowler at bedside. Unable to do EKG at this time d/t tremor.  VS per ems: 164/107 99 98% RA CBG 242

## 2021-04-28 NOTE — Consult Note (Signed)
PHARMACY -  BRIEF ANTIBIOTIC NOTE   Pharmacy has received consult(s) for Cefepime and Vancomycin from an ED provider.  The patient's profile has been reviewed for ht/wt/allergies/indication/available labs.    One time order(s) placed for Vancomycin and Cefepime  Further antibiotics/pharmacy consults should be ordered by admitting physician if indicated.                       Thank you, Darrick Penna, PharmD, MS PGPM Clinical Pharmacist 04/28/2021 3:04 PM

## 2021-04-28 NOTE — Progress Notes (Addendum)
Cross Cover Nursing reports initial troponin at drawn 1900 was 450. No serial levels ordered yet.  Review of labs glucose 631, bicarb 14 and creat 2.09 (KD 3, base line 1.7-2.0) elevated lactate. Beta hydrox - pending. On endo tool. Total 2250 ml LR bolus given and maintenance at 150/h.Rview of EKG SR with prolonged QT Adding mag phos and serial troponins to labs with serial EKG  2058 pm.  Beta hydroxybutyric acid result - 4.10 - indicative of DKA - endotool to change to DKA protocol

## 2021-04-28 NOTE — ED Notes (Signed)
Provider made aware of the patients decreased mental status and his inability to swallow water or PO medication.

## 2021-04-28 NOTE — Sepsis Progress Note (Signed)
eLink is following this Code Sepsis. °

## 2021-04-29 ENCOUNTER — Other Ambulatory Visit: Payer: Self-pay

## 2021-04-29 ENCOUNTER — Inpatient Hospital Stay: Payer: Medicare Other

## 2021-04-29 DIAGNOSIS — E1110 Type 2 diabetes mellitus with ketoacidosis without coma: Secondary | ICD-10-CM | POA: Diagnosis present

## 2021-04-29 LAB — BASIC METABOLIC PANEL
Anion gap: 5 (ref 5–15)
Anion gap: 9 (ref 5–15)
Anion gap: 7 (ref 5–15)
BUN: 39 mg/dL — ABNORMAL HIGH (ref 8–23)
BUN: 40 mg/dL — ABNORMAL HIGH (ref 8–23)
BUN: 41 mg/dL — ABNORMAL HIGH (ref 8–23)
CO2: 24 mmol/L (ref 22–32)
CO2: 24 mmol/L (ref 22–32)
CO2: 25 mmol/L (ref 22–32)
Calcium: 9.5 mg/dL (ref 8.9–10.3)
Calcium: 9.3 mg/dL (ref 8.9–10.3)
Calcium: 9.3 mg/dL (ref 8.9–10.3)
Chloride: 107 mmol/L (ref 98–111)
Chloride: 106 mmol/L (ref 98–111)
Chloride: 105 mmol/L (ref 98–111)
Creatinine, Ser: 2.04 mg/dL — ABNORMAL HIGH (ref 0.61–1.24)
Creatinine, Ser: 2.06 mg/dL — ABNORMAL HIGH (ref 0.61–1.24)
Creatinine, Ser: 2.11 mg/dL — ABNORMAL HIGH (ref 0.61–1.24)
GFR, Estimated: 34 mL/min — ABNORMAL LOW (ref >60)
GFR, Estimated: 33 mL/min — ABNORMAL LOW (ref >60)
GFR, Estimated: 32 mL/min — ABNORMAL LOW (ref >60)
Glucose, Bld: 210 mg/dL — ABNORMAL HIGH (ref 70–99)
Glucose, Bld: 227 mg/dL — ABNORMAL HIGH (ref 70–99)
Glucose, Bld: 201 mg/dL — ABNORMAL HIGH (ref 70–99)
Potassium: 3.7 mmol/L (ref 3.5–5.1)
Potassium: 3.4 mmol/L — ABNORMAL LOW (ref 3.5–5.1)
Potassium: 3.6 mmol/L (ref 3.5–5.1)
Sodium: 136 mmol/L (ref 135–145)
Sodium: 139 mmol/L (ref 135–145)
Sodium: 137 mmol/L (ref 135–145)

## 2021-04-29 LAB — GLUCOSE, CAPILLARY
Glucose-Capillary: 207 mg/dL — ABNORMAL HIGH (ref 70–99)
Glucose-Capillary: 281 mg/dL — ABNORMAL HIGH (ref 70–99)
Glucose-Capillary: 148 mg/dL — ABNORMAL HIGH (ref 70–99)
Glucose-Capillary: 146 mg/dL — ABNORMAL HIGH (ref 70–99)
Glucose-Capillary: 257 mg/dL — ABNORMAL HIGH (ref 70–99)
Glucose-Capillary: 246 mg/dL — ABNORMAL HIGH (ref 70–99)
Glucose-Capillary: 220 mg/dL — ABNORMAL HIGH (ref 70–99)

## 2021-04-29 LAB — CBG MONITORING, ED
Glucose-Capillary: 173 mg/dL — ABNORMAL HIGH (ref 70–99)
Glucose-Capillary: 173 mg/dL — ABNORMAL HIGH (ref 70–99)
Glucose-Capillary: 195 mg/dL — ABNORMAL HIGH (ref 70–99)
Glucose-Capillary: 227 mg/dL — ABNORMAL HIGH (ref 70–99)
Glucose-Capillary: 229 mg/dL — ABNORMAL HIGH (ref 70–99)
Glucose-Capillary: 232 mg/dL — ABNORMAL HIGH (ref 70–99)
Glucose-Capillary: 361 mg/dL — ABNORMAL HIGH (ref 70–99)
Glucose-Capillary: 408 mg/dL — ABNORMAL HIGH (ref 70–99)

## 2021-04-29 LAB — BETA-HYDROXYBUTYRIC ACID
Beta-Hydroxybutyric Acid: >8 mmol/L — ABNORMAL HIGH (ref 0.05–0.27)
Beta-Hydroxybutyric Acid: 0.54 mmol/L — ABNORMAL HIGH (ref 0.05–0.27)
Beta-Hydroxybutyric Acid: 0.85 mmol/L — ABNORMAL HIGH (ref 0.05–0.27)

## 2021-04-29 LAB — STREP PNEUMONIAE URINARY ANTIGEN: Strep Pneumo Urinary Antigen: NEGATIVE

## 2021-04-29 LAB — PROCALCITONIN: Procalcitonin: 0.6 ng/mL

## 2021-04-29 MED ORDER — ORAL CARE MOUTH RINSE
15.0000 mL | Freq: Two times a day (BID) | OROMUCOSAL | Status: DC
  Administered 2021-04-29 – 2021-05-03 (×9): 15 mL via OROMUCOSAL

## 2021-04-29 MED ORDER — INSULIN ASPART 100 UNIT/ML IJ SOLN
0.0000 [IU] | Freq: Every day | INTRAMUSCULAR | Status: DC
  Administered 2021-04-29 – 2021-04-30 (×2): 2 [IU] via SUBCUTANEOUS
  Administered 2021-05-01 – 2021-05-02 (×2): 3 [IU] via SUBCUTANEOUS
  Filled 2021-04-29 (×4): qty 1

## 2021-04-29 MED ORDER — INSULIN ASPART 100 UNIT/ML IJ SOLN
0.0000 [IU] | Freq: Three times a day (TID) | INTRAMUSCULAR | Status: DC
  Administered 2021-04-29: 5 [IU] via SUBCUTANEOUS
  Administered 2021-04-29: 1 [IU] via SUBCUTANEOUS
  Administered 2021-04-30 (×2): 3 [IU] via SUBCUTANEOUS
  Administered 2021-04-30 – 2021-05-01 (×2): 2 [IU] via SUBCUTANEOUS
  Administered 2021-05-01 – 2021-05-02 (×2): 1 [IU] via SUBCUTANEOUS
  Administered 2021-05-02: 2 [IU] via SUBCUTANEOUS
  Administered 2021-05-03: 3 [IU] via SUBCUTANEOUS
  Filled 2021-04-29 (×9): qty 1

## 2021-04-29 MED ORDER — INSULIN GLARGINE-YFGN 100 UNIT/ML ~~LOC~~ SOLN
20.0000 [IU] | Freq: Every day | SUBCUTANEOUS | Status: DC
  Administered 2021-04-29 – 2021-05-03 (×5): 20 [IU] via SUBCUTANEOUS
  Filled 2021-04-29 (×7): qty 0.2

## 2021-04-29 MED ORDER — CHLORHEXIDINE GLUCONATE 0.12 % MT SOLN
15.0000 mL | Freq: Two times a day (BID) | OROMUCOSAL | Status: DC
  Administered 2021-04-29 – 2021-05-03 (×8): 15 mL via OROMUCOSAL
  Filled 2021-04-29 (×8): qty 15

## 2021-04-29 MED ORDER — CHLORHEXIDINE GLUCONATE CLOTH 2 % EX PADS
6.0000 | MEDICATED_PAD | Freq: Every day | CUTANEOUS | Status: DC
  Administered 2021-04-29 – 2021-05-03 (×4): 6 via TOPICAL

## 2021-04-29 NOTE — Evaluation (Addendum)
Clinical/Bedside Swallow Evaluation Patient Details  Name: Hector Mueller MRN: [id number] Date of Birth: [birth year]  Today's Date: 04/29/2021 Time: SLP Start Time (ACUTE ONLY): 1000 SLP Stop Time (ACUTE ONLY): 1030 SLP Time Calculation (min) (ACUTE ONLY): 30 min  Past Medical History:  Past Medical History:  Diagnosis Date   Atrial fibrillation ([place])    BPH (benign prostatic hyperplasia)    Cancer ([place])    liver   CHF (congestive heart failure) ([place])    Chronic kidney disease    Cirrhosis ([place])    Diabetes mellitus without complication (H[facility])    GERD (gastroesophageal reflux disease)    Hepatitis C    Hx of liver transplant ([place])    Pulmonary HTN ([place])    Past Surgical History:  Past Surgical History:  Procedure Laterality Date   ANKLE FRACTURE SURGERY Bilateral    BACK SURGERY     CLAVICLE SURGERY     ESOPHAGOGASTRODUODENOSCOPY N/A 08/19/2020   Procedure: ESOPHAGOGASTRODUODENOSCOPY (EGD);  Surgeon: Virgel Manifold, MD;  Location: [facility] ENDOSCOPY;  Service: Endoscopy;  Laterality: N/A;   INTRAMEDULLARY (IM) NAIL INTERTROCHANTERIC Right 11/28/2017   Procedure: INTRAMEDULLARY (IM) NAIL INTERTROCHANTRIC;  Surgeon: Earnestine Leys, MD;  Location: [facility] ORS;  Service: Orthopedics;  Laterality: Right;   LIVER SURGERY     HPI:  Per 18 H&P "Hector Mueller is a 71 y.o. male with medical history significant of IIDM, CKD stage IIIb, hep C status post liver transplant, chronic diastolic CHF, A-fib on Coumadin, pulmonary hypertension, was found by family member on the floor and confused and sent to ED.     Patient confused and very lethargic, all history provided by patient brother-in-law at bedside.  Patient widowed after his wife died in 2021-03-13.  Brother-in-law's family called him every other day to check out a status with grandson visited him occasionally.  Patient was sleeping around 10 PM yesterday milligrams and visited him.  Brother-in-law called him on Sunday and  everything appears to be baseline.  This morning, father went to visit him and found him very probably had to hold all stool to go to bathroom.  Soon patient was found on the floor by the bathroom with urine and incontinent stool. Patient denied any pain by shaking his head. But unable to answer any other specific questions.  Brother-in-law heard patient as a new cough sounds gurgly.  She further reported patient has a history of " some kind of esophageal narrowing, need to be stretched before" but denied patient ever had aspiration pneumonia before."   Most recent CXR, 04/29/21, "No active disease." Head CT, 04/28/21, "1. No acute intracranial abnormality. Atrophy with chronic microvascular ischemia 2. Negative for cervical fracture."    Assessment / Plan / Recommendation  Clinical Impression  Pt seen for clinical swallowing evaluation. Pt alert and cooperative. Limited verbalizations with intermittent hypophonic vocal quality. Follows 1-step commands with cueing. Inconsistent yes/no responses. Brother-in-law at bedside. Cleared with RN.  Oral motor exam significant for xerostomia. Pt unable to complete volitional cough likely due to AMS.   Pt presents with s/sx mild oral dysphagia. Pt with mildly prolonged mastication of solids and trace-mild oral residual with solids. Oral deficits likely due to AMS and xerostomia. Pharyngeal swallow appeared [facility] per clinical assessment. No overt or subtle s/sx pharyngeal dysphagia. To palpation, pt with seemingly timely swallow initiation and seemingly adequate laryngeal elevation. No change to vocal quality across trials.   Pt is at increased risk for aspiration/aspiration PNA given AMS and multiple medical comorbidities.  Recommend initiation of Dysphagia 3 Diet with Thin Liquids and safe swallowing strategies/aspiration precautions as outlined below.   SLP to f/u per POC for diet tolerance and trials of upgraded textures.   Pt, pt's brother-in-law, and RN  made aware of diet recommendations, safe swallowing strategies/aspiration precautions, and SLP POC. ?full understanding by pt. Brother-in-law verbalized understanding/agreement. RN also made aware of pt's CLOF for communication. ?need for further neurological work up.   SLP Visit Diagnosis: Dysphagia, oral phase (R13.11)    Aspiration Risk  Mild aspiration risk    Diet Recommendation Dysphagia 3 (Mech soft);Thin liquid   Medication Administration: Whole meds with liquid (vs with puree; as tolerated) Supervision: Patient able to self feed;Intermittent supervision to cue for compensatory strategies (set up/assistance with feeding PRN) Compensations: Minimize environmental distractions;Slow rate;Small sips/bites;Follow solids with liquid Postural Changes: Seated upright at 90 degrees;Remain upright for at least 30 minutes after po intake    Other  Recommendations Oral Care Recommendations: Oral care QID;Oral care prior to ice chip/H20    Recommendations for follow up therapy are one component of a multi-disciplinary discharge planning process, led by the attending physician.  Recommendations may be updated based on patient status, additional functional criteria and insurance authorization.  Follow up Recommendations  (TBD)      Assistance Recommended at Discharge Frequent or constant Supervision/Assistance  Functional Status Assessment Patient has had a recent decline in their functional status and demonstrates the ability to make significant improvements in function in a reasonable and predictable amount of time.  Frequency and Duration min 2x/week  2 weeks       Prognosis Prognosis for Safe Diet Advancement: Fair      Swallow Study   General Date of Onset: 04/28/21 HPI: Per 62 H&P "Hector Mueller is a 71 y.o. male with medical history significant of IIDM, CKD stage IIIb, hep C status post liver transplant, chronic diastolic CHF, A-fib on Coumadin, pulmonary hypertension,  was found by family member on the floor and confused and sent to ED.     Patient confused and very lethargic, all history provided by patient brother-in-law at bedside.  Patient widowed after his wife died in 03-20-2021.  Brother-in-law's family called him every other day to check out a status with grandson visited him occasionally.  Patient was sleeping around 10 PM yesterday milligrams and visited him.  Brother-in-law called him on Sunday and everything appears to be baseline.  This morning, father went to visit him and found him very probably had to hold all stool to go to bathroom.  Soon patient was found on the floor by the bathroom with urine and incontinent stool. Patient denied any pain by shaking his head. But unable to answer any other specific questions.  Brother-in-law heard patient as a new cough sounds gurgly.  She further reported patient has a history of " some kind of esophageal narrowing, need to be stretched before" but denied patient ever had aspiration pneumonia before." Type of Study: Bedside Swallow Evaluation Previous Swallow Assessment: Clinical swallowing evaluation in 07/2020 recommended mech soft diet with thin liquids; noted pt had EGD in 2022 (results reviewed and per EMR pt's dysphagia improved following EGD) Diet Prior to this Study: Dysphagia 3 (soft);Thin liquids Temperature Spikes Noted: No Respiratory Status: Room air History of Recent Intubation: No Behavior/Cognition: Alert;Cooperative;Confused;Requires cueing Oral Cavity Assessment: Dry Oral Care Completed by SLP: Yes Oral Cavity - Dentition: Adequate natural dentition Vision: Functional for self-feeding Self-Feeding Abilities: Able to feed self;Needs set up Patient  Positioning: Upright in bed Baseline Vocal Quality: Low vocal intensity Volitional Cough: Cognitively unable to elicit    Oral/Motor/Sensory Function Overall Oral Motor/Sensory Function: Within functional limits   Thin Liquid Thin Liquid: Within  functional limits Presentation: Cup;Straw;Self Fed Other Comments: ~6 oz in single and sequential sips    Puree Puree: Within functional limits Presentation: Self Fed Other Comments: ~2 oz   Solid     Solid: Impaired Presentation: Self Fed Oral Phase Impairments: Impaired mastication Oral Phase Functional Implications: Prolonged oral transit;Oral residue  Other Comments: x1 cracker; presented in halves    Hector Mueller, M.S., [facility] [phone] (ASCOM)  Clearnce Sorrel [facility] 04/29/2021,11:58 AM

## 2021-04-29 NOTE — Progress Notes (Signed)
SLP Cancellation Note  Patient Details Name: Hector Mueller MRN: [id number] DOB: 29-Mar-1950   Cancelled treatment:       Reason Eval/Treat Not Completed: Medical issues which prohibited therapy   SLP consult received and appreciated. Chart review completed.   Per RN, pt NPO for insulin drip in setting of DKA. Will defer clinical swallowing evaluation at this time.  RN to f/u with SLP when pt cleared for POs.  Will continue efforts as appropriate.   Cherrie Gauze, M.S., [facility] [phone] [place])   Quintella Baton 04/29/2021, 8:53 AM

## 2021-04-29 NOTE — Progress Notes (Signed)
Pt for transfer to RM 118. Report given to 1C RN. VSS, no complaints verbalized. More alert this PM, obeying all commands and able to answer some simple questions though still unable to answer orientation questions for now. Pt and family updated with plan of care.

## 2021-04-29 NOTE — ED Notes (Signed)
Dr Mal Misty messaged via secure chat to request orders for transition from insulin drip

## 2021-04-29 NOTE — Progress Notes (Signed)
Inpatient Diabetes Program Recommendations  AACE/ADA: New Consensus Statement on Inpatient Glycemic Control (2015)  Target Ranges:  Prepandial:   less than 140 mg/dL      Peak postprandial:   less than 180 mg/dL (1-2 hours)      Critically ill patients:  140 - 180 mg/dL   Lab Results  Component Value Date   GLUCAP 207 (H) 04/29/2021   HGBA1C 10.5 (H) 04/28/2021    Review of Glycemic Control  Diabetes history: DM2 Outpatient Diabetes medications: Ozempic 1 mg q week ? Lantus 14 units qd Current orders for Inpatient glycemic control: IV insulin transitioning to Semglee 20 units  Inpatient Diabetes Program Recommendations:   Please give Semglee 2 hrs. Prior to D/C of insulin drip -Add Novolog 0-9 units q 4 hrs. While patient is NPO and cover CBG when IV insulin discontinued -When eating, change Novolog correction to tid + hs 0-5 units  Will plan to speak with patient to review A1c 10.5   Thank you, Bethena Roys E. Monai Hindes, RN, MSN, CDE  Diabetes Coordinator Inpatient Glycemic Control Team Team Pager [phone] (8am-5pm) 04/29/2021 8:51 AM

## 2021-04-29 NOTE — ED Notes (Signed)
This RN to reassess pts CBG due to the increase - repeat CBG 173. Insulin adjusted per Endotool.

## 2021-04-29 NOTE — Care Management Important Message (Signed)
Important Message  Patient Details  Name: Hector Mueller MRN: [id number] Date of Birth: 1951-01-21   Medicare Important Message Given:  N/A - LOS <3 / Initial given by admissions     [name] 04/29/2021, 4:45 PM

## 2021-04-29 NOTE — Progress Notes (Addendum)
Progress Note    Hector Mueller  OJJ:[id number] DOB: 06-22-1950  DOA: 04/28/2021 PCP: [name], MD      Brief Narrative:    Medical records reviewed and are as summarized below:  Hector Mueller is a 71 y.o. male with medical history significant for type II DM, CKD stage IIIb, hepatitis C s/p liver transplant, chronic diastolic CHF, atrial fibrillation (no longer on Coumadin), pulmonary hypertension, who was brought to the hospital because of confusion/altered mental status.  Reportedly, he was found on the floor by family member.  Lethargy  He was found to have DKA.  He was treated with IV fluids and IV insulin infusion.  Initially, sepsis was suspected so he was treated with empiric IV antibiotics as well.  However, sepsis was ruled out so antibiotics were discontinued.   Assessment/Plan:   Principal Problem:   Sepsis ([place]) Active Problems:   DKA (diabetic ketoacidosis) ([place])   Body mass index is 23.63 kg/m.  DKA, type II DM: Start insulin glargine 20 units daily and taper off insulin drip.  Use NovoLog as needed for hyperglycemia.  No evidence of sepsis thus far.  Repeat chest x-ray did not show pneumonia and urinalysis did not show any evidence of UTI.  Discontinue IV antibiotics  Acute metabolic encephalopathy: Continue supportive care.  CKD stage 3b: Creatinine is around his baseline.  Paroxysmal atrial fibrillation: Continue metoprolol and amiodarone.  He is no longer on Coumadin.  INR is subtherapeutic.  Chronic diastolic CHF, liver cirrhosis: Compensated  Other comorbidities include pulmonary hypertension, hypertension, anxiety, depression, history of liver transplant on cyclosporine  Diet Order             Diet Carb Modified Fluid consistency: Thin; Room service appropriate? Yes  Diet effective now                    Consultants: None  Procedures: None    Medications:    amiodarone  200 mg Oral Daily   chlorhexidine  15 mL Mouth  Rinse BID   Chlorhexidine Gluconate Cloth  6 each Topical Daily   cycloSPORINE modified  175 mg Oral QPM   cycloSPORINE modified  200 mg Oral Daily   DULoxetine  60 mg Oral QHS   guaiFENesin  1,200 mg Oral BID   heparin  5,000 Units Subcutaneous Q12H   insulin aspart  0-5 Units Subcutaneous QHS   insulin aspart  0-9 Units Subcutaneous TID WC   insulin glargine-yfgn  20 Units Subcutaneous Daily   mouth rinse  15 mL Mouth Rinse q12n4p   melatonin  5 mg Oral QHS   metoprolol succinate  25 mg Oral BID   mirtazapine  15 mg Oral QHS   pantoprazole  40 mg Oral BID   pregabalin  75 mg Oral BID   sildenafil  40 mg Oral TID   Continuous Infusions:  azithromycin Stopped (04/28/21 2139)   cefTRIAXone (ROCEPHIN)  IV Stopped (04/29/21 0356)     Anti-infectives (From admission, onward)    Start     Dose/Rate Route Frequency Ordered Stop   04/29/21 0400  cefTRIAXone (ROCEPHIN) 2 g in sodium chloride 0.9 % 100 mL IVPB        2 g 200 mL/hr over 30 Minutes Intravenous Every 24 hours 04/28/21 1825     04/28/21 2000  azithromycin (ZITHROMAX) 500 mg in sodium chloride 0.9 % 250 mL IVPB        500 mg 250 mL/hr over  60 Minutes Intravenous Every 24 hours 04/28/21 1859     04/28/21 1445  ceFEPIme (MAXIPIME) 2 g in sodium chloride 0.9 % 100 mL IVPB        2 g 200 mL/hr over 30 Minutes Intravenous  Once 04/28/21 1443 04/28/21 1520   04/28/21 1445  metroNIDAZOLE (FLAGYL) IVPB 500 mg        500 mg 100 mL/hr over 60 Minutes Intravenous  Once 04/28/21 1443 04/28/21 1603   04/28/21 1445  vancomycin (VANCOCIN) IVPB 1000 mg/200 mL premix        1,000 mg 200 mL/hr over 60 Minutes Intravenous  Once 04/28/21 1443 04/28/21 1556              Family Communication/Anticipated D/C date and plan/Code Status   DVT prophylaxis: heparin injection 5,000 Units Start: 04/28/21 2200     Code Status: Full Code  Family Communication: Barbaraann Rondo, brother-in-law. He said Jackelyn Poling (niece) is the HPOA. Disposition  Plan: Plan to discharge to SNF   Status is: Inpatient Remains inpatient appropriate because: Altered mental status               Subjective:   Interval events noted.  He is confused and unable to provide any history.  Objective:    Vitals:   04/29/21 0900 04/29/21 1000 04/29/21 1100 04/29/21 1200  BP: (!) 154/72 (!) 123/56 127/71 130/61  Pulse: 81  74 72  Resp: (!) 28 19 18  (!) 22  Temp:    99 F (37.2 C)  TempSrc:    Oral  SpO2: 100%  100% 100%  Weight:      Height:       No data found.   Intake/Output Summary (Last 24 hours) at 04/29/2021 1235 Last data filed at 04/29/2021 0900 Gross per 24 hour  Intake 3052.36 ml  Output 925 ml  Net 2127.36 ml   Filed Weights   04/28/21 1438  Weight: 72.6 kg    Exam:  GEN: NAD SKIN: Warm and dry EYES: EOMI ENT: MMM CV: RRR PULM: CTA B ABD: soft, ND, NT, +BS CNS: Alert but disoriented, non focal EXT: No edema or tenderness        Data Reviewed:   I have personally reviewed following labs and imaging studies:  Labs: Labs show the following:   Basic Metabolic Panel: Recent Labs  Lab 04/28/21 1711 04/28/21 1900 04/28/21 2043 04/29/21 0118 04/29/21 0414 04/29/21 0906  NA 130*  --  136 136 139 137  K 4.4  --  3.8 3.7 3.4* 3.6  CL 98  --  106 107 106 105  CO2 14*  --  20* 24 24 25   GLUCOSE 631*  --  298* 210* 227* 201*  BUN 42*  --  41* 39* 40* 41*  CREATININE 2.09*  --  1.97* 2.04* 2.06* 2.11*  CALCIUM 9.2  --  9.5 9.5 9.3 9.3  MG  --  2.1 2.2  --   --   --   PHOS  --   --  1.8*  --   --   --    GFR Estimated Creatinine Clearance: 31.2 mL/min (A) (by C-G formula based on SCr of 2.11 mg/dL (H)). Liver Function Tests: Recent Labs  Lab 04/28/21 1442  AST 28  ALT 21  ALKPHOS 92  BILITOT 2.1*  PROT 8.4*  ALBUMIN 4.8   Recent Labs  Lab 04/28/21 1442  LIPASE 25   Recent Labs  Lab 04/28/21 1619  AMMONIA 17  Coagulation profile Recent Labs  Lab 04/28/21 1442  INR 1.0     CBC: Recent Labs  Lab 04/28/21 1442  WBC 12.0*  NEUTROABS 10.5*  HGB 16.0  HCT 47.1  MCV 90.6  PLT 134*   Cardiac Enzymes: Recent Labs  Lab 04/28/21 1900  CKTOTAL 604*   BNP (last 3 results) No results for input(s): PROBNP in the last 8760 hours. CBG: Recent Labs  Lab 04/29/21 0737 04/29/21 0832 04/29/21 0945 04/29/21 1050 04/29/21 1141  GLUCAP 195* 207* 281* 148* 146*   D-Dimer: No results for input(s): DDIMER in the last 72 hours. Hgb A1c: Recent Labs    04/28/21 1442  HGBA1C 10.5*   Lipid Profile: No results for input(s): CHOL, HDL, LDLCALC, TRIG, CHOLHDL, LDLDIRECT in the last 72 hours. Thyroid function studies: Recent Labs    04/28/21 1711  TSH 1.047   Anemia work up: Recent Labs    04/28/21 1902  VITAMINB12 206   Sepsis Labs: Recent Labs  Lab 04/28/21 1442 04/28/21 1642 04/28/21 1902 04/28/21 2314 04/29/21 0414  PROCALCITON 0.33  --   --   --  0.60  WBC 12.0*  --   --   --   --   LATICACIDVEN 2.1* 2.4* 2.5* 1.8  --     Microbiology Recent Results (from the past 240 hour(s))  Blood Culture (routine x 2)     Status: None (Preliminary result)   Collection Time: 04/28/21  2:41 PM   Specimen: BLOOD  Result Value Ref Range Status   Specimen Description BLOOD RIGHT ANTECUBITAL  Final   Special Requests BOTTLES DRAWN AEROBIC AND ANAEROBIC BCLV  Final   Culture   Final    NO GROWTH < 24 HOURS Performed at [facility], [address] [place], [place] [address]    Report Status PENDING  Incomplete  Resp Panel by RT-PCR (Flu A&B, Covid) Nasopharyngeal Swab     Status: None   Collection Time: 04/28/21  2:42 PM   Specimen: Nasopharyngeal Swab; Nasopharyngeal(NP) swabs in vial transport medium  Result Value Ref Range Status   SARS Coronavirus 2 by RT PCR NEGATIVE NEGATIVE Final    Comment: (NOTE) SARS-CoV-2 target nucleic acids are NOT DETECTED.  The SARS-CoV-2 RNA is generally detectable in upper respiratory specimens  during the acute phase of infection. The lowest concentration of SARS-CoV-2 viral copies this assay can detect is 138 copies/mL. A negative result does not preclude SARS-Cov-2 infection and should not be used as the sole basis for treatment or other patient management decisions. A negative result may occur with  improper specimen collection/handling, submission of specimen other than nasopharyngeal swab, presence of viral mutation(s) within the areas targeted by this assay, and inadequate number of viral copies(<138 copies/mL). A negative result must be combined with clinical observations, patient history, and epidemiological information. The expected result is Negative.  Fact Sheet for Patients:  EntrepreneurPulse.com.au  Fact Sheet for Healthcare Providers:  IncredibleEmployment.be  This test is no t yet approved or cleared by the [place] FDA and  has been authorized for detection and/or diagnosis of SARS-CoV-2 by FDA under an Emergency Use Authorization (EUA). This EUA will remain  in effect (meaning this test can be used) for the duration of the COVID-19 declaration under Section 564(b)(1) of the Act, 21 U.S.C.section 360bbb-3(b)(1), unless the authorization is terminated  or revoked sooner.       Influenza A by PCR NEGATIVE NEGATIVE Final   Influenza B by PCR NEGATIVE NEGATIVE Final  Comment: (NOTE) The Xpert Xpress SARS-CoV-2/FLU/RSV plus assay is intended as an aid in the diagnosis of influenza from Nasopharyngeal swab specimens and should not be used as a sole basis for treatment. Nasal washings and aspirates are unacceptable for Xpert Xpress SARS-CoV-2/FLU/RSV testing.  Fact Sheet for Patients: EntrepreneurPulse.com.au  Fact Sheet for Healthcare Providers: IncredibleEmployment.be  This test is not yet approved or cleared by the [place] FDA and has been authorized for detection  and/or diagnosis of SARS-CoV-2 by FDA under an Emergency Use Authorization (EUA). This EUA will remain in effect (meaning this test can be used) for the duration of the COVID-19 declaration under Section 564(b)(1) of the Act, 21 U.S.C. section 360bbb-3(b)(1), unless the authorization is terminated or revoked.  Performed at [facility], [address] [place], [place] [address]   Blood Culture (routine x 2)     Status: None (Preliminary result)   Collection Time: 04/28/21  2:42 PM   Specimen: BLOOD  Result Value Ref Range Status   Specimen Description BLOOD LEFT ANTECUBITAL  Final   Special Requests   Final    BOTTLES DRAWN AEROBIC AND ANAEROBIC Blood Culture results may not be optimal due to an inadequate volume of blood received in culture bottles   Culture   Final    NO GROWTH < 24 HOURS Performed at [facility], [address] [place], [place] [address]    Report Status PENDING  Incomplete  MRSA Next Gen by PCR, Nasal     Status: None   Collection Time: 04/28/21  7:45 PM   Specimen: Nasal Mucosa; Nasal Swab  Result Value Ref Range Status   MRSA by PCR Next Gen NOT DETECTED NOT DETECTED Final    Comment: (NOTE) The GeneXpert MRSA Assay (FDA approved for NASAL specimens only), is one component of a comprehensive MRSA colonization surveillance program. It is not intended to diagnose MRSA infection nor to guide or monitor treatment for MRSA infections. Test performance is not FDA approved in patients less than 83 years old. Performed at [facility], [address] [place],  [address]     Procedures and diagnostic studies:  DG Lumbar Spine 2-3 Views  Result Date: 04/28/2021 CLINICAL DATA:  Fall and altered mental status. EXAM: LUMBAR SPINE - 2-3 VIEW COMPARISON:  CT abdomen and pelvis 06/18/2020 FINDINGS: There is diffuse decreased bone mineralization. Bilateral L5-S1 transpedicular rod and screw fusion hardware is again seen. Grade 2  anterolisthesis of L5 on S1 appears similar to prior. Minimal retrolisthesis of L2 on L3 unchanged. Mild T12 vertebral body height loss is unchanged and chronic. There is bilateral Harrington rod fusion hardware spanning the T10 through superior L3 levels. Moderate L5-S1 disc space narrowing. IMPRESSION:: IMPRESSION: 1. Unchanged grade 2 anterolisthesis of L5 on S1 with intact posterior fusion hardware. 2. Posterior thoracolumbar Harrington rod fusion hardware. 3. Moderate L5-S1 disc space narrowing. Electronically Signed   By: Yvonne Kendall M.D.   On: 04/28/2021 18:27   CT Head Wo Contrast  Result Date: 04/28/2021 CLINICAL DATA:  Fall.  Mental status change EXAM: CT HEAD WITHOUT CONTRAST CT CERVICAL SPINE WITHOUT CONTRAST TECHNIQUE: Multidetector CT imaging of the head and cervical spine was performed following the standard protocol without intravenous contrast. Multiplanar CT image reconstructions of the cervical spine were also generated. RADIATION DOSE REDUCTION: This exam was performed according to the departmental dose-optimization program which includes automated exposure control, adjustment of the mA and/or kV according to patient size and/or use of iterative reconstruction  technique. COMPARISON:  CT head 11/27/2017 FINDINGS: CT HEAD FINDINGS Brain: Generalized atrophy. Mild to moderate white matter hypodensity bilaterally, chronic and unchanged. Negative for acute infarct, hemorrhage, mass Vascular: Negative for hyperdense vessel Skull: Negative Sinuses/Orbits: Mild mucosal edema paranasal sinuses. Bilateral cataract extraction Other: None CT CERVICAL SPINE FINDINGS Alignment: Mild retrolisthesis C3-4 and C4-5 Skull base and vertebrae: Negative for fracture Soft tissues and spinal canal: Negative for soft tissue mass or adenopathy. Disc levels: Disc degeneration and spurring is present in the cervical spine. There is moderate foraminal narrowing bilaterally at C4-5 and C5-6 due to spurring mild spinal  stenosis at C4-5. Upper chest: Lung apices clear bilaterally Other: None IMPRESSION: 1. No acute intracranial abnormality. Atrophy with chronic microvascular ischemia 2. Negative for cervical fracture. Electronically Signed   By: Franchot Gallo M.D.   On: 04/28/2021 15:25   CT Cervical Spine Wo Contrast  Result Date: 04/28/2021 CLINICAL DATA:  Fall.  Mental status change EXAM: CT HEAD WITHOUT CONTRAST CT CERVICAL SPINE WITHOUT CONTRAST TECHNIQUE: Multidetector CT imaging of the head and cervical spine was performed following the standard protocol without intravenous contrast. Multiplanar CT image reconstructions of the cervical spine were also generated. RADIATION DOSE REDUCTION: This exam was performed according to the departmental dose-optimization program which includes automated exposure control, adjustment of the mA and/or kV according to patient size and/or use of iterative reconstruction technique. COMPARISON:  CT head 11/27/2017 FINDINGS: CT HEAD FINDINGS Brain: Generalized atrophy. Mild to moderate white matter hypodensity bilaterally, chronic and unchanged. Negative for acute infarct, hemorrhage, mass Vascular: Negative for hyperdense vessel Skull: Negative Sinuses/Orbits: Mild mucosal edema paranasal sinuses. Bilateral cataract extraction Other: None CT CERVICAL SPINE FINDINGS Alignment: Mild retrolisthesis C3-4 and C4-5 Skull base and vertebrae: Negative for fracture Soft tissues and spinal canal: Negative for soft tissue mass or adenopathy. Disc levels: Disc degeneration and spurring is present in the cervical spine. There is moderate foraminal narrowing bilaterally at C4-5 and C5-6 due to spurring mild spinal stenosis at C4-5. Upper chest: Lung apices clear bilaterally Other: None IMPRESSION: 1. No acute intracranial abnormality. Atrophy with chronic microvascular ischemia 2. Negative for cervical fracture. Electronically Signed   By: Franchot Gallo M.D.   On: 04/28/2021 15:25   DG Pelvis  Portable  Result Date: 04/28/2021 CLINICAL DATA:  Recent fall.  Altered mental status. EXAM: PORTABLE PELVIS 1-2 VIEWS COMPARISON:  Pelvis and right hip radiographs 11/27/2017 FINDINGS: Interval cephalomedullary nail fixation of the previously seen right intertrochanteric fracture. Improved alignment. Within the limitations of severely decreased bone mineralization no definite acute fracture is seen. Moderate bilateral femoroacetabular joint space narrowing. L5-S1 posterior rod and screw fusion hardware. IMPRESSION:: IMPRESSION: 1. Within the limitations of diffuse decreased bone mineralization, no definite acute fracture is seen. 2. Interval fixation of right intertrochanteric fracture. Electronically Signed   By: Yvonne Kendall M.D.   On: 04/28/2021 18:23   DG Chest Port 1 View  Result Date: 04/29/2021 CLINICAL DATA:  Diabetic ketoacidosis. EXAM: PORTABLE CHEST 1 VIEW COMPARISON:  04/28/2021 FINDINGS: The heart size and mediastinal contours are within normal limits. Both lungs are clear. Spinal fusion hardware is again noted and is partially imaged. IMPRESSION: No active disease. Electronically Signed   By: Kerby Moors M.D.   On: 04/29/2021 08:49   DG Chest Port 1 View  Result Date: 04/28/2021 CLINICAL DATA:  Fall EXAM: PORTABLE CHEST 1 VIEW COMPARISON:  Chest radiograph 08/10/2020 FINDINGS: The cardiomediastinal silhouette is stable. A loop recorder is again seen. There is no focal  consolidation or pulmonary edema. There is no pleural effusion or pneumothorax. Spinal fusion hardware is partially imaged. There is no acute osseous abnormality. IMPRESSION: Stable chest with no radiographic evidence of acute cardiopulmonary process. Electronically Signed   By: Valetta Mole M.D.   On: 04/28/2021 15:05               LOS: 1 day   Dalayza Zambrana  [facility]   Pager on [email or web address]. If 7PM-7AM, please contact night-coverage at [email or web address]     04/29/2021, 12:35 PM

## 2021-04-29 NOTE — Evaluation (Signed)
Physical Therapy Evaluation Patient Details Name: Hector Mueller MRN: [id number] DOB: 1950-09-16 Today's Date: 04/29/2021  History of Present Illness  presented to ER after being found on floor, generalized weakness, AMS; admitted for management of sepsis (possible aspiration PNA?), metabolic encephalopathy, DKA.  Clinical Impression  Patient resting in bed upon arrival to room; brother-in-law at bedside.  Alert and oriented to self only; follows simple commands, pleasant and cooperative, very motivated to participate.  Significant expressive aphasia; unable to effectively communicate wants/needs.  Inconsistent yes/no responses (perseverative on 'yes' throughout session).  Generally weak and deconditioned throughout all extremities; significant pain to R shoulder with elevation (FACES 6/10).  Continuous tremors L UE > R UE; unchanged with repositioning, activity.  Currently requiring min assist for bed mobility; close sup for unsupported sitting balance.  Unable to tolerate standing or OOB due to fatigue; patient spontaneously returning self to supine.  Will continue to assess/progress mobility in subsequent sessions; anticipate need for +2 with initial standing/OOB attempts. Would benefit from skilled PT to address above deficits and promote optimal return to PLOF.; recommend transition to STR upon discharge from acute hospitalization.      Recommendations for follow up therapy are one component of a multi-disciplinary discharge planning process, led by the attending physician.  Recommendations may be updated based on patient status, additional functional criteria and insurance authorization.  Follow Up Recommendations Skilled nursing-short term rehab (<3 hours/day)    Assistance Recommended at Discharge Frequent or constant Supervision/Assistance  Patient can return home with the following  Two people to help with walking and/or transfers;Two people to help with bathing/dressing/bathroom     Equipment Recommendations    Recommendations for Other Services       Functional Status Assessment Patient has had a recent decline in their functional status and demonstrates the ability to make significant improvements in function in a reasonable and predictable amount of time.     Precautions / Restrictions Precautions Precautions: Fall Restrictions Weight Bearing Restrictions: No      Mobility  Bed Mobility Overal bed mobility: Needs Assistance Bed Mobility: Supine to Sit     Supine to sit: Min assist     General bed mobility comments: assist for truncal elevation; increased pain with WBing R UE    Transfers                   General transfer comment: deferred secondary to fatigue, generalized tremors    Ambulation/Gait               General Gait Details: deferred secondary to fatigue, generalized tremors  Stairs            Wheelchair Mobility    Modified Rankin (Stroke Patients Only)       Balance Overall balance assessment: Needs assistance Sitting-balance support: No upper extremity supported, Feet supported Sitting balance-Leahy Scale: Good Sitting balance - Comments: maintains static with close sup                                     Pertinent Vitals/Pain Pain Assessment Pain Assessment: Faces Faces Pain Scale: Hurts even more Pain Location: R shoulder Pain Descriptors / Indicators: Aching, Grimacing, Guarding Pain Intervention(s): Limited activity within patient's tolerance, Monitored during session, Repositioned    Home Living Family/patient expects to be discharged to:: Private residence Living Arrangements:  (grandson) Available Help at Discharge: Family;Available PRN/intermittently Type of Home: House Home Access:  Stairs to enter Entrance Stairs-Rails: Left (has chair lift available if needed (was installed for wife)) Entrance Stairs-Number of Steps: 4-5   Home Layout: One level Home Equipment:  Conservation officer, nature (2 wheels);Rollator (4 wheels);Cane - single point      Prior Function Prior Level of Function : Independent/Modified Independent             Mobility Comments: Indep with ADLs, household and community mobilization without assist device; denies recent fall history.       Hand Dominance        Extremity/Trunk Assessment   Upper Extremity Assessment Upper Extremity Assessment: Generalized weakness (grossly 3-/5 throughout bilat UEs, elevation limited to shoulder-height; R UE limited by pain)    Lower Extremity Assessment Lower Extremity Assessment: Overall WFL for tasks assessed (grossly 4-/5 throughout)       Communication   Communication: No difficulties  Cognition Arousal/Alertness: Awake/alert Behavior During Therapy: Impulsive Overall Cognitive Status: Impaired/Different from baseline                                 General Comments: Oriented to self only; follows simple commands.  Significant difficulty with expressive speech, inconsistent yes/no questions.        General Comments      Exercises Other Exercises Other Exercises: Reviewed role of PT and progressive mobility; positioned in chair position in bed for increased tolerance to upright and optimal positioning for meals.   Assessment/Plan    PT Assessment Patient needs continued PT services  PT Problem List Decreased strength;Decreased activity tolerance;Decreased balance;Decreased mobility;Decreased coordination;Decreased cognition;Decreased knowledge of use of DME;Decreased range of motion;Decreased safety awareness;Decreased knowledge of precautions;Cardiopulmonary status limiting activity       PT Treatment Interventions DME instruction;Gait training;Stair training;Functional mobility training;Therapeutic activities;Therapeutic exercise;Balance training;Patient/family education;Cognitive remediation    PT Goals (Current goals can be found in the Care Plan section)   Acute Rehab PT Goals Patient Stated Goal: per brother-in-law (at bedside), to work hard and do for himself PT Goal Formulation: With patient/family Time For Goal Achievement: 05/13/21 Potential to Achieve Goals: Good    Frequency Min 2X/week     Co-evaluation               AM-PAC PT "6 Clicks" Mobility  Outcome Measure Help needed turning from your back to your side while in a flat bed without using bedrails?: None Help needed moving from lying on your back to sitting on the side of a flat bed without using bedrails?: A Little Help needed moving to and from a bed to a chair (including a wheelchair)?: Total Help needed standing up from a chair using your arms (e.g., wheelchair or bedside chair)?: Total Help needed to walk in hospital room?: Total Help needed climbing 3-5 steps with a railing? : Total 6 Click Score: 11    End of Session   Activity Tolerance: Patient tolerated treatment well Patient left: in bed;with call bell/phone within reach;with bed alarm set;with family/visitor present Nurse Communication: Mobility status PT Visit Diagnosis: Muscle weakness (generalized) (M62.81);Difficulty in walking, not elsewhere classified (R26.2)    Time: 2563-8937 PT Time Calculation (min) (ACUTE ONLY): 33 min   Charges:   PT Evaluation $PT Eval Moderate Complexity: 1 Mod PT Treatments $Therapeutic Activity: 8-22 mins        Bridgitt Raggio H. Owens Shark, PT, DPT, NCS 04/29/21, 1:50 PM [phone]

## 2021-04-29 NOTE — ED Notes (Signed)
Cbg 195 

## 2021-04-29 NOTE — ED Notes (Signed)
ICU called, informed to call back for report

## 2021-04-30 ENCOUNTER — Encounter: Payer: Self-pay | Admitting: Internal Medicine

## 2021-04-30 ENCOUNTER — Inpatient Hospital Stay: Payer: Medicare Other

## 2021-04-30 LAB — LEGIONELLA PNEUMOPHILA SEROGP 1 UR AG: L. pneumophila Serogp 1 Ur Ag: NEGATIVE

## 2021-04-30 LAB — URINE CULTURE: Culture: NO GROWTH

## 2021-04-30 LAB — GLUCOSE, CAPILLARY
Glucose-Capillary: 214 mg/dL — ABNORMAL HIGH (ref 70–99)
Glucose-Capillary: 174 mg/dL — ABNORMAL HIGH (ref 70–99)
Glucose-Capillary: 221 mg/dL — ABNORMAL HIGH (ref 70–99)
Glucose-Capillary: 204 mg/dL — ABNORMAL HIGH (ref 70–99)

## 2021-04-30 LAB — MYCOPLASMA PNEUMONIAE ANTIBODY, IGM: Mycoplasma pneumo IgM: <770 U/mL (ref 0–769)

## 2021-04-30 LAB — PROCALCITONIN: Procalcitonin: 0.39 ng/mL

## 2021-04-30 MED ORDER — VITAMIN B-12 1000 MCG PO TABS
1000.0000 ug | ORAL_TABLET | Freq: Every day | ORAL | Status: DC
  Administered 2021-04-30 – 2021-05-03 (×4): 1000 ug via ORAL
  Filled 2021-04-30 (×4): qty 1

## 2021-04-30 NOTE — TOC Initial Note (Addendum)
Transition of Care [facility]) - Initial/Assessment Note    Patient Details  Name: Mueller Mueller MRN: [id number] Date of Birth: [birth year]  Transition of [facility]) CM/SW Contact:    Mueller Ivan, LCSW Phone Number: 04/30/2021, 1:58 PM  Clinical Narrative:               CSW attempted to meet with patient at bedside. Patient confused. Called brother in law [place].  Mueller Mueller stated patient's grandson lives with him but is not involved. At baseline, patient drives himself to appointments. PCP is Dr. Caryl Mueller. Pharmacy is [facility] Patient had [facility] in the past. Patient uses a rollator or cane. Mueller [facility] reported patient would be agreeable to SNF and would want to go to [facility] in [place]. Patient has had multiple family members go to [facility], including his Mother and Wife.  CSW is starting SNF work up.  2:10- CSW called Hector Mueller at [facility] who stated they are out of network with [facility] Medicare so cannot take patient.  Notified Mueller Mueller who verbalized understanding. Will follow for other bed offers.     Barriers to Discharge: Continued Medical Work up   Patient Goals and CMS Choice Patient states their goals for this hospitalization and ongoing recovery are:: SNF CMS Medicare.gov Compare Post Acute Care list provided to:: Patient Represenative (must comment) Choice offered to / list presented to : Sibling  Expected Discharge Plan and Services         Living arrangements for the past 2 months: Single Family Home                                      Prior Living Arrangements/Services Living arrangements for the past 2 months: Single Family Home Lives with:: Relatives Patient language and need for interpreter reviewed:: Yes Do you feel safe going back to the place where you live?: Yes      Need for Family Participation in Patient Care: Yes (Comment) Care giver support system in place?: Yes (comment) Current home services: DME Criminal Activity/Legal Involvement  Pertinent to Current Situation/Hospitalization: No - Comment as needed  Activities of Daily Living Home Assistive Devices/Equipment: Walker (specify type) ADL Screening (condition at time of admission) Patient's cognitive ability adequate to safely complete daily activities?: No Is the patient deaf or have difficulty hearing?: No Does the patient have difficulty seeing, even when wearing glasses/contacts?: No Does the patient have difficulty concentrating, remembering, or making decisions?: Yes Patient able to express need for assistance with ADLs?: Yes Does the patient have difficulty dressing or bathing?: Yes Independently performs ADLs?: No Communication: Needs assistance Is this a change from baseline?: Change from baseline, expected to last <3 days Dressing (OT): Needs assistance Is this a change from baseline?: Change from baseline, expected to last <3days Grooming: Needs assistance Is this a change from baseline?: Change from baseline, expected to last <3 days Feeding: Needs assistance Is this a change from baseline?: Change from baseline, expected to last <3 days Bathing: Needs assistance Is this a change from baseline?: Change from baseline, expected to last <3 days Toileting: Needs assistance Is this a change from baseline?: Change from baseline, expected to last <3 days In/Out Bed: Needs assistance Is this a change from baseline?: Change from baseline, expected to last <3 days Walks in Home: Needs assistance Is this a change from baseline?: Change from baseline, expected to last <3 days Does the patient  have difficulty walking or climbing stairs?: Yes Weakness of Legs: Both Weakness of Arms/Hands: None  Permission Sought/Granted Permission sought to share information with : Chartered certified accountant granted to share information with : Yes, Verbal Permission Granted (by brother in Press photographer)     Permission granted to share info w AGENCY: SNFs         Emotional Assessment       Orientation: : Fluctuating Orientation (Suspected and/or reported Sundowners) Alcohol / Substance Use: Not Applicable Psych Involvement: No (comment)  Admission diagnosis:  Pain [R52] DKA (diabetic ketoacidosis) ([place]) [E11.10] SIRS (systemic inflammatory response syndrome) ([place]) [R65.10] DKA, type 2 ([place]) [E11.10] Fall, initial encounter [W19.XXXA] Sepsis ([place]) [A41.9] Diabetic ketoacidosis without coma associated with type 2 diabetes mellitus ([place]) [E11.10] Patient Active Problem List   Diagnosis Date Noted   DKA (diabetic ketoacidosis) ([place]) 04/29/2021   Diabetic ketoacidosis without coma associated with type 2 diabetes mellitus ([place])    Fall    Delirium    Dysphagia    Acute esophagitis    Gastric erythema    Hiatal hernia    Type 2 diabetes mellitus with hyperosmolar hyperglycemic state (HHS) ([place]) 08/11/2020   Hx of liver transplant ([place])    CKD (chronic kidney disease), stage III (HCC)    HFrEF (heart failure with reduced ejection fraction) ([place])    Atrial fibrillation ([place])    Acute respiratory failure ([place]) 07/11/2020   Hip fracture ([place]) 11/27/2017   PCP:  Hector Hector, MD Pharmacy:   [facility], [place] [address] Phone: [phone] Fax: [phone]     Social Determinants of Health (SDOH) Interventions    Readmission Risk Interventions Readmission Risk Prevention Plan 04/30/2021 08/15/2020  Transportation Screening Complete Complete  PCP or Specialist Appt within 5-7 Days Complete -  PCP or Specialist Appt within 3-5 Days - Complete  Home Care Screening Complete -  Medication Review (RN CM) Complete -  HRI or [place] - Complete  Social Work Consult for [place] Planning/Counseling - Complete  Palliative Care Screening - Not Applicable  Medication Review Press photographer) - Complete  Some recent data might be hidden

## 2021-04-30 NOTE — Progress Notes (Signed)
PT Cancellation Note  Patient Details Name: Hector Mueller MRN: [id number] DOB: 1950/12/27   Cancelled Treatment:    Reason Eval/Treat Not Completed: Fatigue/lethargy limiting ability to participate (Treatment session attempted.  Patient sleeping soundly upon arrival to room.  Briefly opens eyes with max cuing, but unable to maintain alertness for participation wtih session. Will re-attempt at later time/date.)  Mariacristina Aday H. Owens Shark, PT, DPT, NCS 04/30/21, 1:56 PM [phone]

## 2021-04-30 NOTE — Evaluation (Signed)
Occupational Therapy Evaluation Patient Details Name: Hector Mueller MRN: [id number] DOB: 1950-09-30 Today's Date: 04/30/2021   History of Present Illness Pt is a 71 y/o M w/ PMH: IIDM, CKD stage IIIb, hep C status post liver transplant, chronic diastolic CHF, A-fib on Coumadin, and pulmonary hypertension who was BIB EMS from home d/t fall-found down in bathroom covered in urine and stool. Pt w/u for altered mental status inccluding suspicion of aspiration pneumonitis, metabolic encephalopathy, and DKA.   Clinical Impression   Pt seen for OT evaluation this date. He apparently is INDEP with fxl mobility at baseline w/ no AD per family. He lives with a grandson who was not present when pt was found down by EMS and soiled. Pt presents this date with confusion, expressive difficulties, decreased activity tolerance, decreased safety awareness, and some general deconditioning. He requires MIN A for ADL transfers and fxl mobility with RW and demos some balance deficits including gross LOB with ~6-7 steps to recliner requiring OT assist for fall prevention. He requires SETUP to MIN A for seated UB ADLs as well as tactile cues to sequence as he appears to process this somewhat better then verbal cues. For LB ADLs, he primarily requires MOD A including for thorough completion of standing peri care d/t decreased dynamic standing balance. Pt up to recliner with alarm set end of session RN notified of session contents. OT assists pt to place lunch order and he is able to say ~85% of words he reads off the menu clearly, but is hypophonic. Pt left with all needs met and in reach. At this time, anticipate he will require STR f/u OT services. Pt's BIL present in room for much of session and is supportive. He would like for pt to come stay with them after rehabilitation to increase care and supervision.     Recommendations for follow up therapy are one component of a multi-disciplinary discharge planning process, led by  the attending physician.  Recommendations may be updated based on patient status, additional functional criteria and insurance authorization.   Follow Up Recommendations  Skilled nursing-short term rehab (<3 hours/day)    Assistance Recommended at Discharge Frequent or constant Supervision/Assistance  Patient can return home with the following A lot of help with walking and/or transfers;A lot of help with bathing/dressing/bathroom;Assistance with cooking/housework;Direct supervision/assist for medications management;Direct supervision/assist for financial management;Assist for transportation;Help with stairs or ramp for entrance    Functional Status Assessment  Patient has had a recent decline in their functional status and demonstrates the ability to make significant improvements in function in a reasonable and predictable amount of time.  Equipment Recommendations  BSC/3in1;Tub/shower seat    Recommendations for Other Services       Precautions / Restrictions Precautions Precautions: Fall Restrictions Weight Bearing Restrictions: No      Mobility Bed Mobility Overal bed mobility: Needs Assistance Bed Mobility: Supine to Sit     Supine to sit: Min assist     General bed mobility comments: increased time, slight assist for trunk elevation.    Transfers Overall transfer level: Needs assistance Equipment used: Rolling walker (2 wheels) Transfers: Sit to/from Stand, Bed to chair/wheelchair/BSC Sit to Stand: Min assist Stand pivot transfers: Min assist (to [facility])         General transfer comment: less tremulous today, able to STS with RW with MIN A, requires increased processing time for safety cues.      Balance Overall balance assessment: Needs assistance Sitting-balance support: No upper extremity supported, Feet  supported Sitting balance-Leahy Scale: Good     Standing balance support: Reliant on assistive device for balance Standing balance-Leahy Scale: Fair                              ADL either performed or assessed with clinical judgement   ADL Overall ADL's : Needs assistance/impaired                                       General ADL Comments: SETUP to MIN A for seated UB ADLs (SETUP for drinking from a cup), MOD A for LB ADLs in sitting including donning socks d/t decreasd dynamic sitting balance, MOD A for standing peri care d/t decreased standing balance and tolerance. Pt able to complete fxl mobility with RW wiht MIN A.     Vision Patient Visual Report: No change from baseline Additional Comments: difficult to formally assess d/t impaired communication, but pt seems to generally track appropriately.     Perception     Praxis      Pertinent Vitals/Pain Pain Assessment Pain Assessment: Faces Faces Pain Scale: Hurts even more Pain Location: R shoulder Pain Descriptors / Indicators: Aching, Grimacing, Guarding Pain Intervention(s): Limited activity within patient's tolerance, Monitored during session, Repositioned     Hand Dominance Right   Extremity/Trunk Assessment Upper Extremity Assessment Upper Extremity Assessment: Generalized weakness;RUE deficits/detail;LUE deficits/detail RUE Deficits / Details: shoulfer flexion to only 1/2 range, brother reports this is the side he fell on which was relayed to MD yesterday (2/16) by PT. Grip MMT grossly 3+/5 LUE Deficits / Details: shld flexion to 2/4 range, Grip MMT only slightly better d/t less pain on L side, ~4-/5   Lower Extremity Assessment Lower Extremity Assessment: Generalized weakness;Defer to PT evaluation       Communication Communication Communication: No difficulties   Cognition Arousal/Alertness: Awake/alert Behavior During Therapy: Impulsive Overall Cognitive Status: Difficult to assess                                 General Comments: Oriented to self only; follows simple commands.  Significant difficulty with expressive  speech, inconsistent yes/no questions, extended time to read from menu to order lunch, hyphonic.     General Comments       Exercises Other Exercises Other Exercises: OT ed re: importance of OOB Activity and role with pt and his BIL   Shoulder Instructions      Home Living Family/patient expects to be discharged to:: Skilled nursing facility Living Arrangements: Alone Available Help at Discharge: Family;Available PRN/intermittently Type of Home: House Home Access: Stairs to enter Entrance Stairs-Number of Steps: 4-5 Entrance Stairs-Rails: Left (has chair lift available if needed (was installed for wife)) Home Layout: One level               Home Equipment: Conservation officer, nature (2 wheels);Rollator (4 wheels);Cane - single point          Prior Functioning/Environment Prior Level of Function : Independent/Modified Independent             Mobility Comments: Indep with ADLs, household and community mobilization without assist device; denies recent fall history.          OT Problem List: Decreased strength;Decreased activity tolerance;Impaired balance (sitting and/or standing);Decreased cognition;Decreased safety awareness;Decreased knowledge of use  of DME or AE;Pain      OT Treatment/Interventions: Self-care/ADL training;Therapeutic exercise;Therapeutic activities;DME and/or AE instruction;Patient/family education    OT Goals(Current goals can be found in the care plan section) Acute Rehab OT Goals Patient Stated Goal: to get stronger before coming home OT Goal Formulation: With family Time For Goal Achievement: 05/14/21 Potential to Achieve Goals: Good ADL Goals Pt Will Perform Lower Body Dressing: with supervision;sit to/from stand Pt Will Transfer to Toilet: with supervision;ambulating Pt Will Perform Toileting - Clothing Manipulation and hygiene: with supervision;sit to/from stand  OT Frequency: Min 2X/week    Co-evaluation              AM-PAC OT "6  Clicks" Daily Activity     Outcome Measure Help from another person eating meals?: None Help from another person taking care of personal grooming?: A Little Help from another person toileting, which includes using toliet, bedpan, or urinal?: A Lot Help from another person bathing (including washing, rinsing, drying)?: A Lot Help from another person to put on and taking off regular upper body clothing?: A Little Help from another person to put on and taking off regular lower body clothing?: A Lot 6 Click Score: 16   End of Session Equipment Utilized During Treatment: Gait belt;Rolling walker (2 wheels) Nurse Communication: Mobility status;Other (comment) (increased processing time to read menu and relay what he wants to order meals)  Activity Tolerance: Patient tolerated treatment well Patient left: in chair;with call bell/phone within reach;with chair alarm set  OT Visit Diagnosis: Unsteadiness on feet (R26.81);Muscle weakness (generalized) (M62.81);History of falling (Z91.81);Other symptoms and signs involving cognitive function;Pain Pain - Right/Left: Right Pain - part of body: Shoulder                Time: 4159-7331 OT Time Calculation (min): 43 min Charges:  OT General Charges $OT Visit: 1 Visit OT Evaluation $OT Eval Moderate Complexity: 1 Mod OT Treatments $Self Care/Home Management : 8-22 mins $Therapeutic Activity: 8-22 mins  Gerrianne Scale, MS, OTR/L ascom [phone] 04/30/21, 11:39 AM

## 2021-04-30 NOTE — NC FL2 (Signed)
[place] LEVEL OF CARE SCREENING TOOL     IDENTIFICATION  Patient Name: Hector Mueller Birthdate: Jun 16, 1950 Sex: male Admission Date (Current Location): 04/28/2021  [facility] and [place] Number:  Engineering geologist and Address:         Provider Number: [id number]  Attending Physician Name and Address:  Jennye Boroughs, MD  Relative Name and Phone Number:       Current Level of Care: Hospital Recommended Level of Care: [place] Prior Approval Number:    Date Approved/Denied:   PASRR Number: [id number]  Discharge Plan:      Current Diagnoses: Patient Active Problem List   Diagnosis Date Noted   DKA (diabetic ketoacidosis) ([place]) 04/29/2021   Diabetic ketoacidosis without coma associated with type 2 diabetes mellitus ([place])    Fall    Delirium    Dysphagia    Acute esophagitis    Gastric erythema    Hiatal hernia    Type 2 diabetes mellitus with hyperosmolar hyperglycemic state (HHS) ([place]) 08/11/2020   Hx of liver transplant ([place])    CKD (chronic kidney disease), stage III (HCC)    HFrEF (heart failure with reduced ejection fraction) (HCC)    Atrial fibrillation ([place])    Acute respiratory failure ([place]) 07/11/2020   Hip fracture ([place]) 11/27/2017    Orientation RESPIRATION BLADDER Height & Weight     Self  Normal Incontinent Weight: 160 lb (72.6 kg) Height:  5\' 9"  (175.3 cm)  BEHAVIORAL SYMPTOMS/MOOD NEUROLOGICAL BOWEL NUTRITION STATUS        Diet (carb modified)  AMBULATORY STATUS COMMUNICATION OF NEEDS Skin   Limited Assist Verbally Other (Comment) (old incision L clavicle)                       Personal Care Assistance Level of Assistance  Bathing, Feeding, Dressing Bathing Assistance: Limited assistance Feeding assistance: Limited assistance Dressing Assistance: Limited assistance     Functional Limitations Info             SPECIAL CARE FACTORS FREQUENCY  PT (By licensed PT), OT (By licensed OT)     PT  Frequency: 5 times per week OT Frequency: 5 times per week            Contractures      Additional Factors Info  Code Status, Allergies Code Status Info: full Allergies Info: nka           Current Medications (04/30/2021):  This is the current hospital active medication list Current Facility-Administered Medications  Medication Dose Route Frequency Provider Last Rate Last Admin   amiodarone (PACERONE) tablet 200 mg  200 mg Oral Daily [place], Ping T, MD   200 mg at 04/30/21 1026   bisacodyl (DULCOLAX) EC tablet 5 mg  5 mg Oral Daily PRN Wynetta Fines T, MD       chlorhexidine (PERIDEX) 0.12 % solution 15 mL  15 mL Mouth Rinse BID Wynetta Fines T, MD   15 mL at 04/30/21 1027   Chlorhexidine Gluconate Cloth 2 % PADS 6 each  6 each Topical Daily Lequita Halt, MD   6 each at 04/30/21 1027   cycloSPORINE modified (NEORAL) capsule 175 mg  175 mg Oral QPM Wynetta Fines T, MD   175 mg at 04/29/21 2118   cycloSPORINE modified (NEORAL) capsule 200 mg  200 mg Oral Daily Wynetta Fines T, MD   200 mg at 04/30/21 1029   dextrose 50 % solution 0-50  mL  0-50 mL Intravenous PRN Lucrezia Starch, MD       DULoxetine (CYMBALTA) DR capsule 60 mg  60 mg Oral QHS Wynetta Fines T, MD   60 mg at 04/29/21 2115   guaiFENesin (MUCINEX) 12 hr tablet 1,200 mg  1,200 mg Oral BID Wynetta Fines T, MD   1,200 mg at 04/30/21 1026   heparin injection 5,000 Units  5,000 Units Subcutaneous Q12H Wynetta Fines T, MD   5,000 Units at 04/30/21 1027   hydrALAZINE (APRESOLINE) injection 5 mg  5 mg Intravenous Q6H PRN Wynetta Fines T, MD       insulin aspart (novoLOG) injection 0-5 Units  0-5 Units Subcutaneous QHS Jennye Boroughs, MD   2 Units at 04/29/21 2112   insulin aspart (novoLOG) injection 0-9 Units  0-9 Units Subcutaneous TID WC Jennye Boroughs, MD   2 Units at 04/30/21 1213   insulin glargine-yfgn (SEMGLEE) injection 20 Units  20 Units Subcutaneous Daily Jennye Boroughs, MD   20 Units at 04/30/21 1213   MEDLINE mouth rinse  15 mL  Mouth Rinse q12n4p Wynetta Fines T, MD   15 mL at 04/30/21 1215   melatonin tablet 5 mg  5 mg Oral QHS Wynetta Fines T, MD   5 mg at 04/29/21 2115   metoprolol succinate (TOPROL-XL) 24 hr tablet 25 mg  25 mg Oral BID Wynetta Fines T, MD   25 mg at 04/30/21 1026   mirtazapine (REMERON) tablet 15 mg  15 mg Oral QHS Wynetta Fines T, MD   15 mg at 04/29/21 2115   ondansetron (ZOFRAN) tablet 4 mg  4 mg Oral Q6H PRN Lequita Halt, MD       pantoprazole (PROTONIX) EC tablet 40 mg  40 mg Oral BID Wynetta Fines T, MD   40 mg at 04/30/21 1026   pregabalin (LYRICA) capsule 75 mg  75 mg Oral BID Wynetta Fines T, MD   75 mg at 04/30/21 1026   sildenafil (REVATIO) tablet 40 mg  40 mg Oral TID Lequita Halt, MD   40 mg at 04/30/21 1028   vitamin B-12 (CYANOCOBALAMIN) tablet 1,000 mcg  1,000 mcg Oral Daily Jennye Boroughs, MD         Discharge Medications: Please see discharge summary for a list of discharge medications.  Relevant Imaging Results:  Relevant Lab Results:   Additional Information SS #: [id number]  [place], LCSW

## 2021-04-30 NOTE — Progress Notes (Addendum)
Progress Note    Red Mandt  KKX:[id number] DOB: Apr 10, 1950  DOA: 04/28/2021 PCP: [name], MD      Brief Narrative:    Medical records reviewed and are as summarized below:  Hector Mueller is a 71 y.o. male with medical history significant for type II DM with history of HHS in May and June 2022, CKD stage IIIb, hepatitis C s/p liver transplant, chronic diastolic CHF, atrial fibrillation (no longer on Coumadin), pulmonary hypertension, who was brought to the hospital because of confusion/altered mental status.  Reportedly, he was found on the floor by family member.  Lethargy  He was found to have DKA.  He was treated with IV fluids and IV insulin infusion.  Initially, sepsis was suspected so he was treated with empiric IV antibiotics as well.  However, sepsis was ruled out so antibiotics were discontinued.   Assessment/Plan:   Principal Problem:   DKA (diabetic ketoacidosis) (HCC)   Body mass index is 23.63 kg/m.  DKA, type II DM: Continue insulin glargine and NovoLog as needed for hyperglycemia.  Hemoglobin A1c was 10.5.  Acute metabolic encephalopathy: Patient cannot have an MRI because he has a pacemaker in place.  Repeat CT head because of persistent altered mental status  CKD stage 3b: Creatinine is around his baseline.  Paroxysmal atrial fibrillation: Continue metoprolol and amiodarone.  He is no longer on Coumadin.  INR is subtherapeutic.  Chronic diastolic CHF, liver cirrhosis: Compensated  Low normal vitamin B12 level (206): He has been started on vitamin B12 supplement  PT and OT recommend discharge to SNF.  Other comorbidities include pulmonary hypertension, hypertension, anxiety, depression, history of liver transplant on cyclosporine  Plan discussed with Barbaraann Rondo, brother-in-law, at the bedside.  He said patient's wife passed away in 2020/12/01.  He thought  Jackelyn Poling (niece) was his HPOA but she said she does not have papers for  HPOA.   Diet Order             Diet Carb Modified Fluid consistency: Thin; Room service appropriate? Yes  Diet effective now                    Consultants: None  Procedures: None    Medications:    amiodarone  200 mg Oral Daily   chlorhexidine  15 mL Mouth Rinse BID   Chlorhexidine Gluconate Cloth  6 each Topical Daily   cycloSPORINE modified  175 mg Oral QPM   cycloSPORINE modified  200 mg Oral Daily   DULoxetine  60 mg Oral QHS   guaiFENesin  1,200 mg Oral BID   heparin  5,000 Units Subcutaneous Q12H   insulin aspart  0-5 Units Subcutaneous QHS   insulin aspart  0-9 Units Subcutaneous TID WC   insulin glargine-yfgn  20 Units Subcutaneous Daily   mouth rinse  15 mL Mouth Rinse q12n4p   melatonin  5 mg Oral QHS   metoprolol succinate  25 mg Oral BID   mirtazapine  15 mg Oral QHS   pantoprazole  40 mg Oral BID   pregabalin  75 mg Oral BID   sildenafil  40 mg Oral TID   vitamin B-12  1,000 mcg Oral Daily   Continuous Infusions:     Anti-infectives (From admission, onward)    Start     Dose/Rate Route Frequency Ordered Stop   04/29/21 0400  cefTRIAXone (ROCEPHIN) 2 g in sodium chloride 0.9 % 100 mL IVPB  Status:  Discontinued  2 g 200 mL/hr over 30 Minutes Intravenous Every 24 hours 04/28/21 1825 04/29/21 1601   04/28/21 2000  azithromycin (ZITHROMAX) 500 mg in sodium chloride 0.9 % 250 mL IVPB  Status:  Discontinued        500 mg 250 mL/hr over 60 Minutes Intravenous Every 24 hours 04/28/21 1859 04/29/21 1601   04/28/21 1445  ceFEPIme (MAXIPIME) 2 g in sodium chloride 0.9 % 100 mL IVPB        2 g 200 mL/hr over 30 Minutes Intravenous  Once 04/28/21 1443 04/28/21 1520   04/28/21 1445  metroNIDAZOLE (FLAGYL) IVPB 500 mg        500 mg 100 mL/hr over 60 Minutes Intravenous  Once 04/28/21 1443 04/28/21 1603   04/28/21 1445  vancomycin (VANCOCIN) IVPB 1000 mg/200 mL premix        1,000 mg 200 mL/hr over 60 Minutes Intravenous  Once 04/28/21 1443  04/28/21 1556              Family Communication/Anticipated D/C date and plan/Code Status   DVT prophylaxis: heparin injection 5,000 Units Start: 04/28/21 2200     Code Status: Full Code  Family Communication: Barbaraann Rondo, brother-in-law.  Disposition Plan: Plan to discharge to SNF   Status is: Inpatient Remains inpatient appropriate because: Altered mental status               Subjective:   Interval events noted.  He still confused.  Barbaraann Rondo, his brother-in-law, was at the bedside.  Objective:    Vitals:   04/30/21 0009 04/30/21 0352 04/30/21 0803 04/30/21 1133  BP: (!) 111/53 117/61 (!) 117/59 122/71  Pulse: (!) 58 (!) 56 (!) 54 (!) 52  Resp: 18 18 18 17   Temp: 98.4 F (36.9 C) 98.6 F (37 C) 97.6 F (36.4 C) 98.2 F (36.8 C)  TempSrc: Oral  Axillary Oral  SpO2: 97% 100% 97% 97%  Weight:      Height:       No data found.   Intake/Output Summary (Last 24 hours) at 04/30/2021 1234 Last data filed at 04/30/2021 1029 Gross per 24 hour  Intake 1686.28 ml  Output 725 ml  Net 961.28 ml   Filed Weights   04/28/21 1438  Weight: 72.6 kg    Exam:  GEN: NAD SKIN: No rash EYES: EOMI ENT: MMM CV: RRR PULM: CTA B ABD: soft, ND, NT, +BS CNS: Alert, disoriented, non focal EXT: No edema or tenderness       Data Reviewed:   I have personally reviewed following labs and imaging studies:  Labs: Labs show the following:   Basic Metabolic Panel: Recent Labs  Lab 04/28/21 1711 04/28/21 1900 04/28/21 2043 04/29/21 0118 04/29/21 0414 04/29/21 0906  NA 130*  --  136 136 139 137  K 4.4  --  3.8 3.7 3.4* 3.6  CL 98  --  106 107 106 105  CO2 14*  --  20* 24 24 25   GLUCOSE 631*  --  298* 210* 227* 201*  BUN 42*  --  41* 39* 40* 41*  CREATININE 2.09*  --  1.97* 2.04* 2.06* 2.11*  CALCIUM 9.2  --  9.5 9.5 9.3 9.3  MG  --  2.1 2.2  --   --   --   PHOS  --   --  1.8*  --   --   --    GFR Estimated Creatinine Clearance: 31.2 mL/min (A) (by  C-G formula based on SCr of 2.11  mg/dL (H)). Liver Function Tests: Recent Labs  Lab 04/28/21 1442  AST 28  ALT 21  ALKPHOS 92  BILITOT 2.1*  PROT 8.4*  ALBUMIN 4.8   Recent Labs  Lab 04/28/21 1442  LIPASE 25   Recent Labs  Lab 04/28/21 1619  AMMONIA 17   Coagulation profile Recent Labs  Lab 04/28/21 1442  INR 1.0    CBC: Recent Labs  Lab 04/28/21 1442  WBC 12.0*  NEUTROABS 10.5*  HGB 16.0  HCT 47.1  MCV 90.6  PLT 134*   Cardiac Enzymes: Recent Labs  Lab 04/28/21 1900  CKTOTAL 604*   BNP (last 3 results) No results for input(s): PROBNP in the last 8760 hours. CBG: Recent Labs  Lab 04/29/21 1622 04/29/21 1740 04/29/21 2005 04/30/21 0804 04/30/21 1134  GLUCAP 257* 246* 220* 214* 174*   D-Dimer: No results for input(s): DDIMER in the last 72 hours. Hgb A1c: Recent Labs    04/28/21 1442  HGBA1C 10.5*   Lipid Profile: No results for input(s): CHOL, HDL, LDLCALC, TRIG, CHOLHDL, LDLDIRECT in the last 72 hours. Thyroid function studies: Recent Labs    04/28/21 1711  TSH 1.047   Anemia work up: Recent Labs    04/28/21 1902  VITAMINB12 206   Sepsis Labs: Recent Labs  Lab 04/28/21 1442 04/28/21 1642 04/28/21 1902 04/28/21 2314 04/29/21 0414 04/30/21 0455  PROCALCITON 0.33  --   --   --  0.60 0.39  WBC 12.0*  --   --   --   --   --   LATICACIDVEN 2.1* 2.4* 2.5* 1.8  --   --     Microbiology Recent Results (from the past 240 hour(s))  Blood Culture (routine x 2)     Status: None (Preliminary result)   Collection Time: 04/28/21  2:41 PM   Specimen: BLOOD  Result Value Ref Range Status   Specimen Description BLOOD RIGHT ANTECUBITAL  Final   Special Requests BOTTLES DRAWN AEROBIC AND ANAEROBIC BCLV  Final   Culture   Final    NO GROWTH 2 DAYS Performed at [facility], [address], [place], [place] [address]    Report Status PENDING  Incomplete  Resp Panel by RT-PCR (Flu A&B, Covid) Nasopharyngeal Swab     Status:  None   Collection Time: 04/28/21  2:42 PM   Specimen: Nasopharyngeal Swab; Nasopharyngeal(NP) swabs in vial transport medium  Result Value Ref Range Status   SARS Coronavirus 2 by RT PCR NEGATIVE NEGATIVE Final    Comment: (NOTE) SARS-CoV-2 target nucleic acids are NOT DETECTED.  The SARS-CoV-2 RNA is generally detectable in upper respiratory specimens during the acute phase of infection. The lowest concentration of SARS-CoV-2 viral copies this assay can detect is 138 copies/mL. A negative result does not preclude SARS-Cov-2 infection and should not be used as the sole basis for treatment or other patient management decisions. A negative result may occur with  improper specimen collection/handling, submission of specimen other than nasopharyngeal swab, presence of viral mutation(s) within the areas targeted by this assay, and inadequate number of viral copies(<138 copies/mL). A negative result must be combined with clinical observations, patient history, and epidemiological information. The expected result is Negative.  Fact Sheet for Patients:  EntrepreneurPulse.com.au  Fact Sheet for Healthcare Providers:  IncredibleEmployment.be  This test is no t yet approved or cleared by the [place] FDA and  has been authorized for detection and/or diagnosis of SARS-CoV-2 by FDA under an Emergency Use Authorization (EUA). This  EUA will remain  in effect (meaning this test can be used) for the duration of the COVID-19 declaration under Section 564(b)(1) of the Act, 21 U.S.C.section 360bbb-3(b)(1), unless the authorization is terminated  or revoked sooner.       Influenza A by PCR NEGATIVE NEGATIVE Final   Influenza B by PCR NEGATIVE NEGATIVE Final    Comment: (NOTE) The Xpert Xpress SARS-CoV-2/FLU/RSV plus assay is intended as an aid in the diagnosis of influenza from Nasopharyngeal swab specimens and should not be used as a sole basis for  treatment. Nasal washings and aspirates are unacceptable for Xpert Xpress SARS-CoV-2/FLU/RSV testing.  Fact Sheet for Patients: EntrepreneurPulse.com.au  Fact Sheet for Healthcare Providers: IncredibleEmployment.be  This test is not yet approved or cleared by the [place] FDA and has been authorized for detection and/or diagnosis of SARS-CoV-2 by FDA under an Emergency Use Authorization (EUA). This EUA will remain in effect (meaning this test can be used) for the duration of the COVID-19 declaration under Section 564(b)(1) of the Act, 21 U.S.C. section 360bbb-3(b)(1), unless the authorization is terminated or revoked.  Performed at [facility], [address] [place], [place] [address]   Blood Culture (routine x 2)     Status: None (Preliminary result)   Collection Time: 04/28/21  2:42 PM   Specimen: BLOOD  Result Value Ref Range Status   Specimen Description BLOOD LEFT ANTECUBITAL  Final   Special Requests   Final    BOTTLES DRAWN AEROBIC AND ANAEROBIC Blood Culture results may not be optimal due to an inadequate volume of blood received in culture bottles   Culture   Final    NO GROWTH 2 DAYS Performed at [facility], [address], [place], [place] [address]    Report Status PENDING  Incomplete  Urine Culture     Status: None   Collection Time: 04/28/21  4:19 PM   Specimen: In/Out Cath Urine  Result Value Ref Range Status   Specimen Description   Final    IN/OUT CATH URINE Performed at [facility], [address], [place], [place] [address]    Special Requests   Final    NONE Performed at [facility], [address] [place], [place] [address]    Culture   Final    NO GROWTH Performed at [facility], [place] [address] [place], [place] [address]    Report Status 04/30/2021 FINAL  Final  MRSA Next Gen by PCR, Nasal     Status: None   Collection Time: 04/28/21  7:45 PM    Specimen: Nasal Mucosa; Nasal Swab  Result Value Ref Range Status   MRSA by PCR Next Gen NOT DETECTED NOT DETECTED Final    Comment: (NOTE) The GeneXpert MRSA Assay (FDA approved for NASAL specimens only), is one component of a comprehensive MRSA colonization surveillance program. It is not intended to diagnose MRSA infection nor to guide or monitor treatment for MRSA infections. Test performance is not FDA approved in patients less than 21 years old. Performed at [facility], [address], [place], [place] [address]     Procedures and diagnostic studies:  DG Lumbar Spine 2-3 Views  Result Date: 04/28/2021 CLINICAL DATA:  Fall and altered mental status. EXAM: LUMBAR SPINE - 2-3 VIEW COMPARISON:  CT abdomen and pelvis 06/18/2020 FINDINGS: There is diffuse decreased bone mineralization. Bilateral L5-S1 transpedicular rod and screw fusion hardware is again seen. Grade 2 anterolisthesis of L5 on S1 appears  similar to prior. Minimal retrolisthesis of L2 on L3 unchanged. Mild T12 vertebral body height loss is unchanged and chronic. There is bilateral Harrington rod fusion hardware spanning the T10 through superior L3 levels. Moderate L5-S1 disc space narrowing. IMPRESSION:: IMPRESSION: 1. Unchanged grade 2 anterolisthesis of L5 on S1 with intact posterior fusion hardware. 2. Posterior thoracolumbar Harrington rod fusion hardware. 3. Moderate L5-S1 disc space narrowing. Electronically Signed   By: Yvonne Kendall M.D.   On: 04/28/2021 18:27   CT Head Wo Contrast  Result Date: 04/28/2021 CLINICAL DATA:  Fall.  Mental status change EXAM: CT HEAD WITHOUT CONTRAST CT CERVICAL SPINE WITHOUT CONTRAST TECHNIQUE: Multidetector CT imaging of the head and cervical spine was performed following the standard protocol without intravenous contrast. Multiplanar CT image reconstructions of the cervical spine were also generated. RADIATION DOSE REDUCTION: This exam was performed according to the  departmental dose-optimization program which includes automated exposure control, adjustment of the mA and/or kV according to patient size and/or use of iterative reconstruction technique. COMPARISON:  CT head 11/27/2017 FINDINGS: CT HEAD FINDINGS Brain: Generalized atrophy. Mild to moderate white matter hypodensity bilaterally, chronic and unchanged. Negative for acute infarct, hemorrhage, mass Vascular: Negative for hyperdense vessel Skull: Negative Sinuses/Orbits: Mild mucosal edema paranasal sinuses. Bilateral cataract extraction Other: None CT CERVICAL SPINE FINDINGS Alignment: Mild retrolisthesis C3-4 and C4-5 Skull base and vertebrae: Negative for fracture Soft tissues and spinal canal: Negative for soft tissue mass or adenopathy. Disc levels: Disc degeneration and spurring is present in the cervical spine. There is moderate foraminal narrowing bilaterally at C4-5 and C5-6 due to spurring mild spinal stenosis at C4-5. Upper chest: Lung apices clear bilaterally Other: None IMPRESSION: 1. No acute intracranial abnormality. Atrophy with chronic microvascular ischemia 2. Negative for cervical fracture. Electronically Signed   By: Franchot Gallo M.D.   On: 04/28/2021 15:25   CT Cervical Spine Wo Contrast  Result Date: 04/28/2021 CLINICAL DATA:  Fall.  Mental status change EXAM: CT HEAD WITHOUT CONTRAST CT CERVICAL SPINE WITHOUT CONTRAST TECHNIQUE: Multidetector CT imaging of the head and cervical spine was performed following the standard protocol without intravenous contrast. Multiplanar CT image reconstructions of the cervical spine were also generated. RADIATION DOSE REDUCTION: This exam was performed according to the departmental dose-optimization program which includes automated exposure control, adjustment of the mA and/or kV according to patient size and/or use of iterative reconstruction technique. COMPARISON:  CT head 11/27/2017 FINDINGS: CT HEAD FINDINGS Brain: Generalized atrophy. Mild to moderate  white matter hypodensity bilaterally, chronic and unchanged. Negative for acute infarct, hemorrhage, mass Vascular: Negative for hyperdense vessel Skull: Negative Sinuses/Orbits: Mild mucosal edema paranasal sinuses. Bilateral cataract extraction Other: None CT CERVICAL SPINE FINDINGS Alignment: Mild retrolisthesis C3-4 and C4-5 Skull base and vertebrae: Negative for fracture Soft tissues and spinal canal: Negative for soft tissue mass or adenopathy. Disc levels: Disc degeneration and spurring is present in the cervical spine. There is moderate foraminal narrowing bilaterally at C4-5 and C5-6 due to spurring mild spinal stenosis at C4-5. Upper chest: Lung apices clear bilaterally Other: None IMPRESSION: 1. No acute intracranial abnormality. Atrophy with chronic microvascular ischemia 2. Negative for cervical fracture. Electronically Signed   By: Franchot Gallo M.D.   On: 04/28/2021 15:25   DG Pelvis Portable  Result Date: 04/28/2021 CLINICAL DATA:  Recent fall.  Altered mental status. EXAM: PORTABLE PELVIS 1-2 VIEWS COMPARISON:  Pelvis and right hip radiographs 11/27/2017 FINDINGS: Interval cephalomedullary nail fixation of the previously seen right intertrochanteric fracture. Improved alignment.  Within the limitations of severely decreased bone mineralization no definite acute fracture is seen. Moderate bilateral femoroacetabular joint space narrowing. L5-S1 posterior rod and screw fusion hardware. IMPRESSION:: IMPRESSION: 1. Within the limitations of diffuse decreased bone mineralization, no definite acute fracture is seen. 2. Interval fixation of right intertrochanteric fracture. Electronically Signed   By: Yvonne Kendall M.D.   On: 04/28/2021 18:23   DG Chest Port 1 View  Result Date: 04/29/2021 CLINICAL DATA:  Diabetic ketoacidosis. EXAM: PORTABLE CHEST 1 VIEW COMPARISON:  04/28/2021 FINDINGS: The heart size and mediastinal contours are within normal limits. Both lungs are clear. Spinal fusion hardware  is again noted and is partially imaged. IMPRESSION: No active disease. Electronically Signed   By: Kerby Moors M.D.   On: 04/29/2021 08:49   DG Chest Port 1 View  Result Date: 04/28/2021 CLINICAL DATA:  Fall EXAM: PORTABLE CHEST 1 VIEW COMPARISON:  Chest radiograph 08/10/2020 FINDINGS: The cardiomediastinal silhouette is stable. A loop recorder is again seen. There is no focal consolidation or pulmonary edema. There is no pleural effusion or pneumothorax. Spinal fusion hardware is partially imaged. There is no acute osseous abnormality. IMPRESSION: Stable chest with no radiographic evidence of acute cardiopulmonary process. Electronically Signed   By: Valetta Mole M.D.   On: 04/28/2021 15:05               LOS: 2 days   Nyilah Kight  [facility]   Pager on [email or web address]. If 7PM-7AM, please contact night-coverage at [email or web address]     04/30/2021, 12:34 PM

## 2021-05-01 LAB — BASIC METABOLIC PANEL
Anion gap: 8 (ref 5–15)
BUN: 41 mg/dL — ABNORMAL HIGH (ref 8–23)
CO2: 23 mmol/L (ref 22–32)
Calcium: 9.1 mg/dL (ref 8.9–10.3)
Chloride: 103 mmol/L (ref 98–111)
Creatinine, Ser: 1.98 mg/dL — ABNORMAL HIGH (ref 0.61–1.24)
GFR, Estimated: 35 mL/min — ABNORMAL LOW (ref >60)
Glucose, Bld: 145 mg/dL — ABNORMAL HIGH (ref 70–99)
Potassium: 3.4 mmol/L — ABNORMAL LOW (ref 3.5–5.1)
Sodium: 134 mmol/L — ABNORMAL LOW (ref 135–145)

## 2021-05-01 LAB — GLUCOSE, CAPILLARY
Glucose-Capillary: 133 mg/dL — ABNORMAL HIGH (ref 70–99)
Glucose-Capillary: 109 mg/dL — ABNORMAL HIGH (ref 70–99)
Glucose-Capillary: 156 mg/dL — ABNORMAL HIGH (ref 70–99)
Glucose-Capillary: 274 mg/dL — ABNORMAL HIGH (ref 70–99)

## 2021-05-01 LAB — AMMONIA: Ammonia: <10 umol/L (ref 9–35)

## 2021-05-01 MED ORDER — POTASSIUM CHLORIDE CRYS ER 20 MEQ PO TBCR
40.0000 meq | EXTENDED_RELEASE_TABLET | Freq: Once | ORAL | Status: AC
  Administered 2021-05-01: 08:00:00 40 meq via ORAL
  Filled 2021-05-01: qty 2

## 2021-05-01 MED ORDER — METOPROLOL SUCCINATE ER 25 MG PO TB24
25.0000 mg | ORAL_TABLET | Freq: Every day | ORAL | Status: DC
  Administered 2021-05-02 – 2021-05-03 (×2): 25 mg via ORAL
  Filled 2021-05-01 (×2): qty 1

## 2021-05-01 NOTE — Progress Notes (Signed)
Progress Note    Hector Mueller  ZDG:[id number] DOB: March 14, 1951  DOA: 04/28/2021 PCP: [name], MD      Brief Narrative:    Medical records reviewed and are as summarized below:  Hector Mueller is a 71 y.o. 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  His wife passed away in 2021/03/09.  He was brought to the hospital because of confusion/lethargy.  Reportedly, he was found on the floor by family member.    He was found to have DKA.  He was treated with IV fluids and IV insulin infusion.  Initially, sepsis was suspected so he was treated with empiric IV antibiotics as well.  However, sepsis was ruled out so antibiotics were discontinued.   Assessment/Plan:   Principal Problem:   DKA (diabetic ketoacidosis) (HCC)   Body mass index is 23.63 kg/m.  DKA, type II DM: Continue insulin glargine and NovoLog.  Hemoglobin A1c was 10.5.  Acute metabolic encephalopathy: Check ammonia level.  Patient cannot have an MRI because he has a pacemaker in place.  Repeat CT head did not show any acute abnormality.    CKD stage 3b: Creatinine is stable.  Paroxysmal atrial fibrillation: Continue metoprolol and amiodarone.  He is no longer on Coumadin.  INR is subtherapeutic.  Chronic diastolic CHF, liver cirrhosis: Compensated  Low normal vitamin B12 level (206): Continue vitamin B12 supplement  PT and OT recommend discharge to SNF.  Other comorbidities include pulmonary hypertension, hypertension, anxiety, depression, history of liver transplant on cyclosporine    Diet Order             Diet Carb Modified Fluid consistency: Thin; Room service appropriate? Yes  Diet effective now                    Consultants: None  Procedures: None    Medications:    amiodarone   200 mg Oral Daily   chlorhexidine  15 mL Mouth Rinse BID   Chlorhexidine Gluconate Cloth  6 each Topical Daily   cycloSPORINE modified  175 mg Oral QPM   cycloSPORINE modified  200 mg Oral Daily   DULoxetine  60 mg Oral QHS   guaiFENesin  1,200 mg Oral BID   heparin  5,000 Units Subcutaneous Q12H   insulin aspart  0-5 Units Subcutaneous QHS   insulin aspart  0-9 Units Subcutaneous TID WC   insulin glargine-yfgn  20 Units Subcutaneous Daily   mouth rinse  15 mL Mouth Rinse q12n4p   melatonin  5 mg Oral QHS   metoprolol succinate  25 mg Oral BID   mirtazapine  15 mg Oral QHS   pantoprazole  40 mg Oral BID   pregabalin  75 mg Oral BID   sildenafil  40 mg Oral TID   vitamin B-12  1,000 mcg Oral Daily   Continuous Infusions:     Anti-infectives (From admission, onward)    Start     Dose/Rate Route Frequency Ordered Stop   04/29/21 0400  cefTRIAXone (ROCEPHIN) 2 g in sodium chloride 0.9 % 100 mL IVPB  Status:  Discontinued        2 g 200 mL/hr over 30 Minutes Intravenous Every 24 hours 04/28/21 1825 04/29/21 1601   04/28/21 2000  azithromycin (ZITHROMAX) 500 mg in sodium chloride  0.9 % 250 mL IVPB  Status:  Discontinued        500 mg 250 mL/hr over 60 Minutes Intravenous Every 24 hours 04/28/21 1859 04/29/21 1601   04/28/21 1445  ceFEPIme (MAXIPIME) 2 g in sodium chloride 0.9 % 100 mL IVPB        2 g 200 mL/hr over 30 Minutes Intravenous  Once 04/28/21 1443 04/28/21 1520   04/28/21 1445  metroNIDAZOLE (FLAGYL) IVPB 500 mg        500 mg 100 mL/hr over 60 Minutes Intravenous  Once 04/28/21 1443 04/28/21 1603   04/28/21 1445  vancomycin (VANCOCIN) IVPB 1000 mg/200 mL premix        1,000 mg 200 mL/hr over 60 Minutes Intravenous  Once 04/28/21 1443 04/28/21 1556              Family Communication/Anticipated D/C date and plan/Code Status   DVT prophylaxis: heparin injection 5,000 Units Start: 04/28/21 2200     Code Status: Full Code  Family Communication: Barbaraann Rondo  (brother-in-law) and Debbie (niece) at the bedside.  Disposition Plan: Plan to discharge to SNF   Status is: Inpatient Remains inpatient appropriate because: Altered mental status               Subjective:   Interval events noted.  He is more alert and communicative but he still confused.  He was able to recognize his brother-in-law and niece at the bedside.  Objective:    Vitals:   04/30/21 2148 05/01/21 0056 05/01/21 0553 05/01/21 0749  BP: 112/67 (!) 103/56 (!) 133/56 122/64  Pulse: (!) 50 (!) 51 (!) 50 (!) 52  Resp: 18 18 16 16   Temp: 97.9 F (36.6 C) 98 F (36.7 C) 98 F (36.7 C) 97.8 F (36.6 C)  TempSrc: Oral Oral Oral   SpO2: 99% 97% 99% 100%  Weight:      Height:       No data found.   Intake/Output Summary (Last 24 hours) at 05/01/2021 1059 Last data filed at 04/30/2021 1903 Gross per 24 hour  Intake 360 ml  Output 600 ml  Net -240 ml   Filed Weights   04/28/21 1438  Weight: 72.6 kg    Exam:  GEN: NAD SKIN: No rash EYES: EOMI ENT: MMM CV: RRR PULM: CTA B ABD: soft, ND, NT, +BS CNS: Alert disoriented, non focal EXT: No edema or tenderness       Data Reviewed:   I have personally reviewed following labs and imaging studies:  Labs: Labs show the following:   Basic Metabolic Panel: Recent Labs  Lab 04/28/21 1900 04/28/21 2043 04/29/21 0118 04/29/21 0414 04/29/21 0906 05/01/21 0536  NA  --  136 136 139 137 134*  K  --  3.8 3.7 3.4* 3.6 3.4*  CL  --  106 107 106 105 103  CO2  --  20* 24 24 25 23   GLUCOSE  --  298* 210* 227* 201* 145*  BUN  --  41* 39* 40* 41* 41*  CREATININE  --  1.97* 2.04* 2.06* 2.11* 1.98*  CALCIUM  --  9.5 9.5 9.3 9.3 9.1  MG 2.1 2.2  --   --   --   --   PHOS  --  1.8*  --   --   --   --    GFR Estimated Creatinine Clearance: 33.2 mL/min (A) (by C-G formula based on SCr of 1.98 mg/dL (H)). Liver Function Tests: Recent Labs  Lab 04/28/21  1442  AST 28  ALT 21  ALKPHOS 92  BILITOT 2.1*   PROT 8.4*  ALBUMIN 4.8   Recent Labs  Lab 04/28/21 1442  LIPASE 25   Recent Labs  Lab 04/28/21 1619  AMMONIA 17   Coagulation profile Recent Labs  Lab 04/28/21 1442  INR 1.0    CBC: Recent Labs  Lab 04/28/21 1442  WBC 12.0*  NEUTROABS 10.5*  HGB 16.0  HCT 47.1  MCV 90.6  PLT 134*   Cardiac Enzymes: Recent Labs  Lab 04/28/21 1900  CKTOTAL 604*   BNP (last 3 results) No results for input(s): PROBNP in the last 8760 hours. CBG: Recent Labs  Lab 04/30/21 0804 04/30/21 1134 04/30/21 1626 04/30/21 2147 05/01/21 0758  GLUCAP 214* 174* 221* 204* 133*   D-Dimer: No results for input(s): DDIMER in the last 72 hours. Hgb A1c: Recent Labs    04/28/21 1442  HGBA1C 10.5*   Lipid Profile: No results for input(s): CHOL, HDL, LDLCALC, TRIG, CHOLHDL, LDLDIRECT in the last 72 hours. Thyroid function studies: Recent Labs    04/28/21 1711  TSH 1.047   Anemia work up: Recent Labs    04/28/21 1902  VITAMINB12 206   Sepsis Labs: Recent Labs  Lab 04/28/21 1442 04/28/21 1642 04/28/21 1902 04/28/21 2314 04/29/21 0414 04/30/21 0455  PROCALCITON 0.33  --   --   --  0.60 0.39  WBC 12.0*  --   --   --   --   --   LATICACIDVEN 2.1* 2.4* 2.5* 1.8  --   --     Microbiology Recent Results (from the past 240 hour(s))  Blood Culture (routine x 2)     Status: None (Preliminary result)   Collection Time: 04/28/21  2:41 PM   Specimen: BLOOD  Result Value Ref Range Status   Specimen Description BLOOD RIGHT ANTECUBITAL  Final   Special Requests BOTTLES DRAWN AEROBIC AND ANAEROBIC BCLV  Final   Culture   Final    NO GROWTH 3 DAYS Performed at [facility], [address] [place], [place] [address]    Report Status PENDING  Incomplete  Resp Panel by RT-PCR (Flu A&B, Covid) Nasopharyngeal Swab     Status: None   Collection Time: 04/28/21  2:42 PM   Specimen: Nasopharyngeal Swab; Nasopharyngeal(NP) swabs in vial transport medium  Result Value Ref  Range Status   SARS Coronavirus 2 by RT PCR NEGATIVE NEGATIVE Final    Comment: (NOTE) SARS-CoV-2 target nucleic acids are NOT DETECTED.  The SARS-CoV-2 RNA is generally detectable in upper respiratory specimens during the acute phase of infection. The lowest concentration of SARS-CoV-2 viral copies this assay can detect is 138 copies/mL. A negative result does not preclude SARS-Cov-2 infection and should not be used as the sole basis for treatment or other patient management decisions. A negative result may occur with  improper specimen collection/handling, submission of specimen other than nasopharyngeal swab, presence of viral mutation(s) within the areas targeted by this assay, and inadequate number of viral copies(<138 copies/mL). A negative result must be combined with clinical observations, patient history, and epidemiological information. The expected result is Negative.  Fact Sheet for Patients:  EntrepreneurPulse.com.au  Fact Sheet for Healthcare Providers:  IncredibleEmployment.be  This test is no t yet approved or cleared by the [place] FDA and  has been authorized for detection and/or diagnosis of SARS-CoV-2 by FDA under an Emergency Use Authorization (EUA). This EUA will remain  in effect (meaning this test  can be used) for the duration of the COVID-19 declaration under Section 564(b)(1) of the Act, 21 U.S.C.section 360bbb-3(b)(1), unless the authorization is terminated  or revoked sooner.       Influenza A by PCR NEGATIVE NEGATIVE Final   Influenza B by PCR NEGATIVE NEGATIVE Final    Comment: (NOTE) The Xpert Xpress SARS-CoV-2/FLU/RSV plus assay is intended as an aid in the diagnosis of influenza from Nasopharyngeal swab specimens and should not be used as a sole basis for treatment. Nasal washings and aspirates are unacceptable for Xpert Xpress SARS-CoV-2/FLU/RSV testing.  Fact Sheet for  Patients: EntrepreneurPulse.com.au  Fact Sheet for Healthcare Providers: IncredibleEmployment.be  This test is not yet approved or cleared by the [place] FDA and has been authorized for detection and/or diagnosis of SARS-CoV-2 by FDA under an Emergency Use Authorization (EUA). This EUA will remain in effect (meaning this test can be used) for the duration of the COVID-19 declaration under Section 564(b)(1) of the Act, 21 U.S.C. section 360bbb-3(b)(1), unless the authorization is terminated or revoked.  Performed at [facility], [address] [place], [place] [address]   Blood Culture (routine x 2)     Status: None (Preliminary result)   Collection Time: 04/28/21  2:42 PM   Specimen: BLOOD  Result Value Ref Range Status   Specimen Description BLOOD LEFT ANTECUBITAL  Final   Special Requests   Final    BOTTLES DRAWN AEROBIC AND ANAEROBIC Blood Culture results may not be optimal due to an inadequate volume of blood received in culture bottles   Culture   Final    NO GROWTH 3 DAYS Performed at [facility], [address] [place], [place] [address]    Report Status PENDING  Incomplete  Urine Culture     Status: None   Collection Time: 04/28/21  4:19 PM   Specimen: In/Out Cath Urine  Result Value Ref Range Status   Specimen Description   Final    IN/OUT CATH URINE Performed at [facility], [address], [place], [place] [address]    Special Requests   Final    NONE Performed at [facility], [address] [place], [place] [address]    Culture   Final    NO GROWTH Performed at [facility], [place] [address] [place], [place] [address]    Report Status 04/30/2021 FINAL  Final  MRSA Next Gen by PCR, Nasal     Status: None   Collection Time: 04/28/21  7:45 PM   Specimen: Nasal Mucosa; Nasal Swab  Result Value Ref Range Status   MRSA by PCR Next Gen NOT DETECTED NOT DETECTED Final     Comment: (NOTE) The GeneXpert MRSA Assay (FDA approved for NASAL specimens only), is one component of a comprehensive MRSA colonization surveillance program. It is not intended to diagnose MRSA infection nor to guide or monitor treatment for MRSA infections. Test performance is not FDA approved in patients less than 53 years old. Performed at [facility], [address] [place],  [address]     Procedures and diagnostic studies:  CT HEAD WO CONTRAST (5MM)  Result Date: 04/30/2021 CLINICAL DATA:  Altered mental status EXAM: CT HEAD WITHOUT CONTRAST TECHNIQUE: Contiguous axial images were obtained from the base of the skull through the vertex without intravenous contrast. RADIATION DOSE REDUCTION: This exam was performed according to the departmental dose-optimization program which includes automated exposure control, adjustment of the mA and/or kV according to patient  size and/or use of iterative reconstruction technique. COMPARISON:  04/28/2021 FINDINGS: Brain: No acute intracranial findings are seen. There are no signs of bleeding. Cortical sulci are prominent. There is decreased density in periventricular and subcortical white matter. Vascular: Unremarkable. Skull: Unremarkable. Sinuses/Orbits: Unremarkable. Other: No significant interval changes are noted. IMPRESSION: No acute intracranial findings are seen. Atrophy. Small-vessel disease. Electronically Signed   By: Elmer Picker M.D.   On: 04/30/2021 13:02               LOS: 3 days   Lavoris Canizales  [facility]   Pager on [email or web address]. If 7PM-7AM, please contact night-coverage at [email or web address]     05/01/2021, 10:59 AM

## 2021-05-01 NOTE — Progress Notes (Signed)
Speech Language Pathology Treatment: Dysphagia  Patient Details Name: Hector Mueller MRN: [id number] DOB: December 15, 1950 Today's Date: 05/01/2021 Time: 6195-0932 SLP Time Calculation (min) (ACUTE ONLY): 22 min  Assessment / Plan / Recommendation Clinical Impression  Skilled treatment session focused on pt's safe consumption of regular diet with thin liquids. Skilled observation was provided of patient consuming regular lunch tray of fish filets, carrots and tossed salad with iced tea. Pt fed himself at an appropriate rate with functional oral phase and no oral residue noted. Pt didn't display any overt s/s of aspiration when consuming thin liquids. At this time, skilled ST intervention is not indicated. ST will sign off.    HPI HPI: Per 70 H&P "Hector Mueller is a 71 y.o. male with medical history significant of IIDM, CKD stage IIIb, hep C status post liver transplant, chronic diastolic CHF, A-fib on Coumadin, pulmonary hypertension, was found by family member on the floor and confused and sent to ED.     Patient confused and very lethargic, all history provided by patient brother-in-law at bedside.  Patient widowed after his wife died in March 11, 2021.  Brother-in-law's family called him every other day to check out a status with grandson visited him occasionally.  Patient was sleeping around 10 PM yesterday milligrams and visited him.  Brother-in-law called him on Sunday and everything appears to be baseline.  This morning, father went to visit him and found him very probably had to hold all stool to go to bathroom.  Soon patient was found on the floor by the bathroom with urine and incontinent stool. Patient denied any pain by shaking his head. But unable to answer any other specific questions.  Brother-in-law heard patient as a new cough sounds gurgly.  She further reported patient has a history of " some kind of esophageal narrowing, need to be stretched before" but denied patient ever had  aspiration pneumonia before."      SLP Plan  All goals met      Recommendations for follow up therapy are one component of a multi-disciplinary discharge planning process, led by the attending physician.  Recommendations may be updated based on patient status, additional functional criteria and insurance authorization.    Recommendations  Diet recommendations: Regular;Thin liquid Liquids provided via: Cup;Straw Medication Administration: Whole meds with liquid Supervision: Patient able to self feed Compensations: Minimize environmental distractions;Slow rate;Small sips/bites;Follow solids with liquid Postural Changes and/or Swallow Maneuvers: Out of bed for meals;Seated upright 90 degrees;Upright 30-60 min after meal                Oral Care Recommendations: Oral care BID Follow Up Recommendations: No SLP follow up Assistance recommended at discharge: Intermittent Supervision/Assistance SLP Visit Diagnosis: Dysphagia, oral phase (R13.11) Plan: All goals met          Cheo Selvey B. Rutherford Nail M.S., CCC-SLP, [place] Office [phone]  Millicent Blazejewski Rutherford Nail  05/01/2021, 11:59 AM

## 2021-05-02 LAB — CYCLOSPORINE: Cyclosporine, LabCorp: 104 ng/mL (ref 100–400)

## 2021-05-02 LAB — GLUCOSE, CAPILLARY
Glucose-Capillary: 139 mg/dL — ABNORMAL HIGH (ref 70–99)
Glucose-Capillary: 116 mg/dL — ABNORMAL HIGH (ref 70–99)
Glucose-Capillary: 190 mg/dL — ABNORMAL HIGH (ref 70–99)
Glucose-Capillary: 269 mg/dL — ABNORMAL HIGH (ref 70–99)

## 2021-05-02 NOTE — Progress Notes (Signed)
Physical Therapy Treatment Patient Details Name: Hector Mueller MRN: [id number] DOB: 1950-05-05 Today's Date: 05/02/2021   History of Present Illness Pt is a 71 y/o M w/ PMH: IIDM, CKD stage IIIb, hep C status post liver transplant, chronic diastolic CHF, A-fib on Coumadin, and pulmonary hypertension who was BIB EMS from home d/t fall-found down in bathroom covered in urine and stool. Pt w/u for altered mental status inccluding suspicion of aspiration pneumonitis, metabolic encephalopathy, and DKA.    PT Comments    Patient more alert and less trimerous this session.  BIL Rodney at bedside. Patient was able to ambulate around nursing station with min A and close chair follow due to B LE weakness with risk for buckling. Patient continues to require increased time for processing to follow cuing. Utilized +2 for chair follow and min A transfer. Patient demo near buckling on sit <> stand. Current discharge plan remains appropriate as patient requires short term rehab prior to return home due to inability to ambulate or transfer without assistance. Patient would benefit from skilled physical therapy to address impairments and functional limitations (see PT Problem List below) to work towards stated goals and return to PLOF or maximal functional independence.     Recommendations for follow up therapy are one component of a multi-disciplinary discharge planning process, led by the attending physician.  Recommendations may be updated based on patient status, additional functional criteria and insurance authorization.  Follow Up Recommendations  Skilled nursing-short term rehab (<3 hours/day)     Assistance Recommended at Discharge Frequent or constant Supervision/Assistance  Patient can return home with the following Two people to help with walking and/or transfers;Two people to help with bathing/dressing/bathroom   Equipment Recommendations  Rolling walker (2 wheels)    Recommendations for Other  Services       Precautions / Restrictions Precautions Precautions: Fall Restrictions Weight Bearing Restrictions: No     Mobility  Bed Mobility Overal bed mobility: Needs Assistance Bed Mobility: Supine to Sit     Supine to sit: Min assist     General bed mobility comments: increased time, slight assist for trunk elevation with left UE  HHA    Transfers Overall transfer level: Needs assistance Equipment used: Rolling walker (2 wheels) Transfers: Sit to/from Stand, Bed to chair/wheelchair/BSC Sit to Stand: Min assist, +2 safety/equipment           General transfer comment: sit <> stand min A with PT and PTA at each shoulder, legs partially giving way but then able to stand up fully and remain standing. Needed step by step instructions and increased time to safely perform stand to sit in chair.    Ambulation/Gait Ambulation/Gait assistance: Min assist Gait Distance (Feet): 180 Feet Assistive device: Rolling walker (2 wheels) Gait Pattern/deviations: Step-to pattern, Decreased stride length Gait velocity: reduced     General Gait Details: Patient ambulated around nursing station with close W/C follow and CGA at belt and shoulder due to potential for B LE to buckle. Was wobbly at times but did not buckle.   Stairs             Wheelchair Mobility    Modified Rankin (Stroke Patients Only)       Balance Overall balance assessment: Needs assistance Sitting-balance support: No upper extremity supported, Feet supported Sitting balance-Leahy Scale: Good     Standing balance support: Reliant on assistive device for balance Standing balance-Leahy Scale: Fair  Cognition Arousal/Alertness: Awake/alert Behavior During Therapy: WFL for tasks assessed/performed Overall Cognitive Status: Impaired/Different from baseline                                 General Comments: Oriented to self, remembers the day  from previous discussion with friend. Does not know year.        Exercises Other Exercises Other Exercises: educated on safe transfer techniques, discharge reccomendations.    General Comments        Pertinent Vitals/Pain Pain Assessment Pain Assessment: Faces Faces Pain Scale: Hurts little more Pain Location: left arm and right leg Pain Descriptors / Indicators: Aching, Grimacing, Guarding Pain Intervention(s): Limited activity within patient's tolerance, Monitored during session, Repositioned    Home Living                          Prior Function            PT Goals (current goals can now be found in the care plan section) Acute Rehab PT Goals Patient Stated Goal: per brother-in-law (at bedside), to work hard and do for himself PT Goal Formulation: With patient/family Time For Goal Achievement: 05/13/21 Potential to Achieve Goals: Good Progress towards PT goals: Progressing toward goals    Frequency    Min 2X/week      PT Plan Current plan remains appropriate    Co-evaluation              AM-PAC PT "6 Clicks" Mobility   Outcome Measure  Help needed turning from your back to your side while in a flat bed without using bedrails?: A Little Help needed moving from lying on your back to sitting on the side of a flat bed without using bedrails?: A Little Help needed moving to and from a bed to a chair (including a wheelchair)?: A Little Help needed standing up from a chair using your arms (e.g., wheelchair or bedside chair)?: A Little Help needed to walk in hospital room?: A Lot Help needed climbing 3-5 steps with a railing? : Total 6 Click Score: 15    End of Session Equipment Utilized During Treatment: Gait belt Activity Tolerance: Patient tolerated treatment well Patient left: with call bell/phone within reach;with family/visitor present;with chair alarm set;in chair Nurse Communication: Mobility status PT Visit Diagnosis: Muscle weakness  (generalized) (M62.81);Difficulty in walking, not elsewhere classified (R26.2)     Time: 1101-1120 PT Time Calculation (min) (ACUTE ONLY): 19 min  Charges:  $Gait Training: 8-22 mins                     Everlean Alstrom. Graylon Good, PT, DPT 05/02/21, 11:33 AM

## 2021-05-02 NOTE — Progress Notes (Signed)
Progress Note    Hector Mueller  WUX:[id number] DOB: 1950-07-30  DOA: 04/28/2021 PCP: [name], MD      Brief Narrative:    Medical records reviewed and are as summarized below:  Hector Mueller is a 71 y.o. 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  His wife passed away in 03/20/21.  He was brought to the hospital because of confusion/lethargy.  Reportedly, he was found on the floor by family member.    He was found to have DKA.  He was treated with IV fluids and IV insulin infusion.  Initially, sepsis was suspected so he was treated with empiric IV antibiotics as well.  However, sepsis was ruled out so antibiotics were discontinued.   Assessment/Plan:   Principal Problem:   DKA (diabetic ketoacidosis) (HCC)   Body mass index is 23.63 kg/m.  DKA, type II DM: Continue insulin glargine and NovoLog as needed for hyperglycemia.  Hemoglobin A1c was 10.5.  Acute metabolic encephalopathy: Resolved.  Ammonia level was normal.  Repeat CT head did not show any acute abnormality.    CKD stage 3b: Creatinine is stable.  Paroxysmal atrial fibrillation: Continue metoprolol and amiodarone.  He is no longer on Coumadin.  INR is subtherapeutic.  Chronic diastolic CHF, liver cirrhosis: Compensated  Low normal vitamin B12 level (206): Continue vitamin B12 supplement  PT and OT recommend discharge to SNF.  Other comorbidities include pulmonary hypertension, hypertension, anxiety, depression, history of liver transplant on cyclosporine  Awaiting placement to SNF. Plan discussed with his brother-in-law at the bedside.  Diet Order             Diet Carb Modified Fluid consistency: Thin; Room service appropriate? Yes  Diet effective now                     Consultants: None  Procedures: None    Medications:    amiodarone  200 mg Oral Daily   chlorhexidine  15 mL Mouth Rinse BID   Chlorhexidine Gluconate Cloth  6 each Topical Daily   cycloSPORINE modified  175 mg Oral QPM   cycloSPORINE modified  200 mg Oral Daily   DULoxetine  60 mg Oral QHS   guaiFENesin  1,200 mg Oral BID   heparin  5,000 Units Subcutaneous Q12H   insulin aspart  0-5 Units Subcutaneous QHS   insulin aspart  0-9 Units Subcutaneous TID WC   insulin glargine-yfgn  20 Units Subcutaneous Daily   mouth rinse  15 mL Mouth Rinse q12n4p   melatonin  5 mg Oral QHS   metoprolol succinate  25 mg Oral Daily   mirtazapine  15 mg Oral QHS   pantoprazole  40 mg Oral BID   pregabalin  75 mg Oral BID   sildenafil  40 mg Oral TID   vitamin B-12  1,000 mcg Oral Daily   Continuous Infusions:     Anti-infectives (From admission, onward)    Start     Dose/Rate Route Frequency Ordered Stop   04/29/21 0400  cefTRIAXone (ROCEPHIN) 2 g in sodium chloride 0.9 % 100 mL IVPB  Status:  Discontinued        2 g 200 mL/hr over 30 Minutes Intravenous Every 24 hours 04/28/21 1825 04/29/21 1601   04/28/21 2000  azithromycin (ZITHROMAX)  500 mg in sodium chloride 0.9 % 250 mL IVPB  Status:  Discontinued        500 mg 250 mL/hr over 60 Minutes Intravenous Every 24 hours 04/28/21 1859 04/29/21 1601   04/28/21 1445  ceFEPIme (MAXIPIME) 2 g in sodium chloride 0.9 % 100 mL IVPB        2 g 200 mL/hr over 30 Minutes Intravenous  Once 04/28/21 1443 04/28/21 1520   04/28/21 1445  metroNIDAZOLE (FLAGYL) IVPB 500 mg        500 mg 100 mL/hr over 60 Minutes Intravenous  Once 04/28/21 1443 04/28/21 1603   04/28/21 1445  vancomycin (VANCOCIN) IVPB 1000 mg/200 mL premix        1,000 mg 200 mL/hr over 60 Minutes Intravenous  Once 04/28/21 1443 04/28/21 1556              Family Communication/Anticipated D/C date and plan/Code Status   DVT prophylaxis: heparin injection 5,000 Units  Start: 04/28/21 2200     Code Status: Full Code  Family Communication: Barbaraann Rondo (brother-in-law)  Disposition Plan: Plan to discharge to SNF   Status is: Inpatient Remains inpatient appropriate because: Awaiting placement to SNF               Subjective:   Interval events noted.  He has no complaints.  He feels much better today.  His brother-in-law was at the bedside and he thinks patient is at his baseline.  Objective:    Vitals:   05/02/21 0355 05/02/21 0451 05/02/21 0731 05/02/21 1141  BP: 117/61 123/67 (!) 146/66 (!) 103/48  Pulse:  (!) 50 (!) 51 (!) 54  Resp: 17 16 14 16   Temp: (!) 97.5 F (36.4 C) 98.1 F (36.7 C) 97.6 F (36.4 C) 97.9 F (36.6 C)  TempSrc: Oral  Oral Oral  SpO2: 99% 98% 99% 98%  Weight:      Height:       No data found.   Intake/Output Summary (Last 24 hours) at 05/02/2021 1222 Last data filed at 05/02/2021 1049 Gross per 24 hour  Intake 0 ml  Output 1550 ml  Net -1550 ml   Filed Weights   04/28/21 1438  Weight: 72.6 kg    Exam:  GEN: NAD SKIN: No rash EYES: EOMI ENT: MMM CV: RRR PULM: CTA B ABD: soft, ND, NT, +BS CNS: AAO x 3, non focal EXT: No edema or tenderness        Data Reviewed:   I have personally reviewed following labs and imaging studies:  Labs: Labs show the following:   Basic Metabolic Panel: Recent Labs  Lab 04/28/21 1900 04/28/21 2043 04/29/21 0118 04/29/21 0414 04/29/21 0906 05/01/21 0536  NA  --  136 136 139 137 134*  K  --  3.8 3.7 3.4* 3.6 3.4*  CL  --  106 107 106 105 103  CO2  --  20* 24 24 25 23   GLUCOSE  --  298* 210* 227* 201* 145*  BUN  --  41* 39* 40* 41* 41*  CREATININE  --  1.97* 2.04* 2.06* 2.11* 1.98*  CALCIUM  --  9.5 9.5 9.3 9.3 9.1  MG 2.1 2.2  --   --   --   --   PHOS  --  1.8*  --   --   --   --    GFR Estimated Creatinine Clearance: 33.2 mL/min (A) (by C-G formula based on SCr of 1.98 mg/dL (H)). Liver Function Tests: Recent  Labs  Lab 04/28/21 1442   AST 28  ALT 21  ALKPHOS 92  BILITOT 2.1*  PROT 8.4*  ALBUMIN 4.8   Recent Labs  Lab 04/28/21 1442  LIPASE 25   Recent Labs  Lab 04/28/21 1619 05/01/21 1122  AMMONIA 17 <10   Coagulation profile Recent Labs  Lab 04/28/21 1442  INR 1.0    CBC: Recent Labs  Lab 04/28/21 1442  WBC 12.0*  NEUTROABS 10.5*  HGB 16.0  HCT 47.1  MCV 90.6  PLT 134*   Cardiac Enzymes: Recent Labs  Lab 04/28/21 1900  CKTOTAL 604*   BNP (last 3 results) No results for input(s): PROBNP in the last 8760 hours. CBG: Recent Labs  Lab 05/01/21 1217 05/01/21 1718 05/01/21 2123 05/02/21 0740 05/02/21 1141  GLUCAP 109* 156* 274* 139* 116*   D-Dimer: No results for input(s): DDIMER in the last 72 hours. Hgb A1c: No results for input(s): HGBA1C in the last 72 hours.  Lipid Profile: No results for input(s): CHOL, HDL, LDLCALC, TRIG, CHOLHDL, LDLDIRECT in the last 72 hours. Thyroid function studies: No results for input(s): TSH, T4TOTAL, T3FREE, THYROIDAB in the last 72 hours.  Invalid input(s): FREET3  Anemia work up: No results for input(s): VITAMINB12, FOLATE, FERRITIN, TIBC, IRON, RETICCTPCT in the last 72 hours.  Sepsis Labs: Recent Labs  Lab 04/28/21 1442 04/28/21 1642 04/28/21 1902 04/28/21 2314 04/29/21 0414 04/30/21 0455  PROCALCITON 0.33  --   --   --  0.60 0.39  WBC 12.0*  --   --   --   --   --   LATICACIDVEN 2.1* 2.4* 2.5* 1.8  --   --     Microbiology Recent Results (from the past 240 hour(s))  Blood Culture (routine x 2)     Status: None (Preliminary result)   Collection Time: 04/28/21  2:41 PM   Specimen: BLOOD  Result Value Ref Range Status   Specimen Description BLOOD RIGHT ANTECUBITAL  Final   Special Requests BOTTLES DRAWN AEROBIC AND ANAEROBIC BCLV  Final   Culture   Final    NO GROWTH 4 DAYS Performed at [facility], [address], [place], [place] [address]    Report Status PENDING  Incomplete  Resp Panel by RT-PCR (Flu A&B,  Covid) Nasopharyngeal Swab     Status: None   Collection Time: 04/28/21  2:42 PM   Specimen: Nasopharyngeal Swab; Nasopharyngeal(NP) swabs in vial transport medium  Result Value Ref Range Status   SARS Coronavirus 2 by RT PCR NEGATIVE NEGATIVE Final    Comment: (NOTE) SARS-CoV-2 target nucleic acids are NOT DETECTED.  The SARS-CoV-2 RNA is generally detectable in upper respiratory specimens during the acute phase of infection. The lowest concentration of SARS-CoV-2 viral copies this assay can detect is 138 copies/mL. A negative result does not preclude SARS-Cov-2 infection and should not be used as the sole basis for treatment or other patient management decisions. A negative result may occur with  improper specimen collection/handling, submission of specimen other than nasopharyngeal swab, presence of viral mutation(s) within the areas targeted by this assay, and inadequate number of viral copies(<138 copies/mL). A negative result must be combined with clinical observations, patient history, and epidemiological information. The expected result is Negative.  Fact Sheet for Patients:  EntrepreneurPulse.com.au  Fact Sheet for Healthcare Providers:  IncredibleEmployment.be  This test is no t yet approved or cleared by the [place] FDA and  has been authorized for detection and/or diagnosis of SARS-CoV-2 by FDA  under an Emergency Use Authorization (EUA). This EUA will remain  in effect (meaning this test can be used) for the duration of the COVID-19 declaration under Section 564(b)(1) of the Act, 21 U.S.C.section 360bbb-3(b)(1), unless the authorization is terminated  or revoked sooner.       Influenza A by PCR NEGATIVE NEGATIVE Final   Influenza B by PCR NEGATIVE NEGATIVE Final    Comment: (NOTE) The Xpert Xpress SARS-CoV-2/FLU/RSV plus assay is intended as an aid in the diagnosis of influenza from Nasopharyngeal swab specimens and should  not be used as a sole basis for treatment. Nasal washings and aspirates are unacceptable for Xpert Xpress SARS-CoV-2/FLU/RSV testing.  Fact Sheet for Patients: EntrepreneurPulse.com.au  Fact Sheet for Healthcare Providers: IncredibleEmployment.be  This test is not yet approved or cleared by the [place] FDA and has been authorized for detection and/or diagnosis of SARS-CoV-2 by FDA under an Emergency Use Authorization (EUA). This EUA will remain in effect (meaning this test can be used) for the duration of the COVID-19 declaration under Section 564(b)(1) of the Act, 21 U.S.C. section 360bbb-3(b)(1), unless the authorization is terminated or revoked.  Performed at [facility], [address] [place], [place] [address]   Blood Culture (routine x 2)     Status: None (Preliminary result)   Collection Time: 04/28/21  2:42 PM   Specimen: BLOOD  Result Value Ref Range Status   Specimen Description BLOOD LEFT ANTECUBITAL  Final   Special Requests   Final    BOTTLES DRAWN AEROBIC AND ANAEROBIC Blood Culture results may not be optimal due to an inadequate volume of blood received in culture bottles   Culture   Final    NO GROWTH 4 DAYS Performed at [facility], [address], [place], [place] [address]    Report Status PENDING  Incomplete  Urine Culture     Status: None   Collection Time: 04/28/21  4:19 PM   Specimen: In/Out Cath Urine  Result Value Ref Range Status   Specimen Description   Final    IN/OUT CATH URINE Performed at [facility], [address], [place], [place] [address]    Special Requests   Final    NONE Performed at [facility], [address] [place], [place] [address]    Culture   Final    NO GROWTH Performed at [facility], [place] [address], [place], [place] [address]    Report Status 04/30/2021 FINAL  Final  MRSA Next Gen by PCR, Nasal     Status: None   Collection  Time: 04/28/21  7:45 PM   Specimen: Nasal Mucosa; Nasal Swab  Result Value Ref Range Status   MRSA by PCR Next Gen NOT DETECTED NOT DETECTED Final    Comment: (NOTE) The GeneXpert MRSA Assay (FDA approved for NASAL specimens only), is one component of a comprehensive MRSA colonization surveillance program. It is not intended to diagnose MRSA infection nor to guide or monitor treatment for MRSA infections. Test performance is not FDA approved in patients less than 27 years old. Performed at [facility], [address] [place], [place] [address]     Procedures and diagnostic studies:  CT HEAD WO CONTRAST (5MM)  Result Date: 04/30/2021 CLINICAL DATA:  Altered mental status EXAM: CT HEAD WITHOUT CONTRAST TECHNIQUE: Contiguous axial images were obtained from the base of the skull through the vertex without intravenous contrast. RADIATION DOSE REDUCTION: This exam was performed according to the departmental  dose-optimization program which includes automated exposure control, adjustment of the mA and/or kV according to patient size and/or use of iterative reconstruction technique. COMPARISON:  04/28/2021 FINDINGS: Brain: No acute intracranial findings are seen. There are no signs of bleeding. Cortical sulci are prominent. There is decreased density in periventricular and subcortical white matter. Vascular: Unremarkable. Skull: Unremarkable. Sinuses/Orbits: Unremarkable. Other: No significant interval changes are noted. IMPRESSION: No acute intracranial findings are seen. Atrophy. Small-vessel disease. Electronically Signed   By: Elmer Picker M.D.   On: 04/30/2021 13:02               LOS: 4 days   Julyanna Scholle  [facility]   Pager on [email or web address]. If 7PM-7AM, please contact night-coverage at [email or web address]     05/02/2021, 12:22 PM

## 2021-05-02 NOTE — TOC Progression Note (Signed)
Transition of Care [facility]) - Progression Note    Patient Details  Name: Dex Blakely MRN: [id number] Date of Birth: 09/16/1950  Transition of Care [facility]) CM/SW [place], LCSW Phone Number: 05/02/2021, 9:12 AM  Clinical Narrative:   Patient still needs bed offers. CSW reached out directly to Admission Workers at [facility], [facility], [facility], [facility], and [facility] asking them to review referral.  [facility] - out of network.       Barriers to Discharge: Continued Medical Work up  Expected Discharge Plan and Services         Living arrangements for the past 2 months: Single Family Home                                       Social Determinants of Health (SDOH) Interventions    Readmission Risk Interventions Readmission Risk Prevention Plan 04/30/2021 08/15/2020  Transportation Screening Complete Complete  PCP or Specialist Appt within 5-7 Days Complete -  PCP or Specialist Appt within 3-5 Days - Complete  Home Care Screening Complete -  Medication Review (RN CM) Complete -  HRI or [place] - Complete  Social Work Consult for [place] Planning/Counseling - Complete  Palliative Care Screening - Not Applicable  Medication Review Press photographer) - Complete  Some recent data might be hidden

## 2021-05-03 LAB — CULTURE, BLOOD (ROUTINE X 2)
Culture: NO GROWTH
Culture: NO GROWTH

## 2021-05-03 LAB — GLUCOSE, CAPILLARY
Glucose-Capillary: 208 mg/dL — ABNORMAL HIGH (ref 70–99)
Glucose-Capillary: 114 mg/dL — ABNORMAL HIGH (ref 70–99)

## 2021-05-03 MED ORDER — CYANOCOBALAMIN 1000 MCG PO TABS: 1000.0000 ug | ORAL_TABLET | Freq: Every day | ORAL | Status: AC

## 2021-05-03 MED ORDER — AMIODARONE HCL 200 MG PO TABS: 200.0000 mg | ORAL_TABLET | Freq: Every day | ORAL | 0 refills | Status: AC

## 2021-05-03 MED ORDER — METOPROLOL SUCCINATE ER 25 MG PO TB24: 25.0000 mg | ORAL_TABLET | Freq: Every day | ORAL | Status: AC

## 2021-05-03 MED ORDER — BASAGLAR KWIKPEN 100 UNIT/ML ~~LOC~~ SOPN: 20.0000 [IU] | PEN_INJECTOR | Freq: Every day | SUBCUTANEOUS | 0 refills | Status: AC

## 2021-05-03 NOTE — Care Management Important Message (Signed)
Important Message  Patient Details  Name: Hector Mueller MRN: [id number] Date of Birth: 01-May-1950   Medicare Important Message Given:  Yes     Juliann Pulse A Cressida Milford 05/03/2021, 3:25 PM

## 2021-05-03 NOTE — Progress Notes (Signed)
Occupational Therapy Treatment Patient Details Name: Hector Mueller MRN: [id number] DOB: [birth year] Today's Date: 05/03/2021   History of present illness Pt is a 71 y/o M w/ PMH: IIDM, CKD stage IIIb, hep C status post liver transplant, chronic diastolic CHF, A-fib on Coumadin, and pulmonary hypertension who was BIB EMS from home d/t fall-found down in bathroom covered in urine and stool. Pt w/u for altered mental status including suspicion of aspiration pneumonitis, metabolic encephalopathy, and DKA.   OT comments  Pt seen for OT treatment on this date. Upon arrival to room, pt awake and seated upright in recliner with family Barbaraann Rondo) present. Pt alert and oriented to self and place; disoriented to situation. Pt agreeable to OT tx. Pt currently requires SUPERVISION for donning/doffing socks via figure-4 position, MIN GUARD for walking to/from bathroom with RW, MIN GUARD for toilet transfer (with BSC frame over toilet), and SUPERVISION for standing hand hygiene d/t decreased strength, decreased balance, and decreased awareness of deficits. Pt and pt's family reporting that pt demonstrates improved strength and balance compared to initial OT evaluation. This [place] Curia discussed level of family assist at available home. Per Barbaraann Rondo, pt's grandson will be moving out when pt returns home but verbalizes he and additional family members can provide the assistance that pt currently requires for ADLs/functional mobility. Based on discussion with pt & family, and given pt's progress towards goals, discharge recommendation updated to [facility].    Recommendations for follow up therapy are one component of a multi-disciplinary discharge planning process, led by the attending physician.  Recommendations may be updated based on patient status, additional functional criteria and insurance authorization.    Follow Up Recommendations  Home health OT    Assistance Recommended at Discharge Frequent or constant  Supervision/Assistance  Patient can return home with the following  A little help with walking and/or transfers;A little help with bathing/dressing/bathroom;Assistance with cooking/housework;Help with stairs or ramp for entrance   Equipment Recommendations  BSC/3in1;Tub/shower seat       Precautions / Restrictions Precautions Precautions: Fall Restrictions Weight Bearing Restrictions: No       Mobility Bed Mobility               General bed mobility comments: not assessed, in recliner pre/post session    Transfers Overall transfer level: Needs assistance Equipment used: Rolling walker (2 wheels) Transfers: Sit to/from Stand Sit to Stand: Supervision, Min guard           General transfer comment: Cues for hand placement. Requires SUPERVISION from recliner and MIN GUARD to toilet (with BSC frame place over toilet)     Balance Overall balance assessment: Needs assistance Sitting-balance support: Feet supported, No upper extremity supported Sitting balance-Leahy Scale: Good     Standing balance support: No upper extremity supported, During functional activity Standing balance-Leahy Scale: Fair Standing balance comment: requires SUPERVISION during standing hand hygiene                           ADL either performed or assessed with clinical judgement   ADL Overall ADL's : Needs assistance/impaired     Grooming: Wash/dry hands;Supervision/safety;Standing               Lower Body Dressing: Supervision/safety;Sitting/lateral leans Lower Body Dressing Details (indicate cue type and reason): to don/doff socks via figure-4 position Toilet Transfer: Min guard;BSC/3in1;Rolling walker (2 wheels) Toilet Transfer Details (indicate cue type and reason): require cues for safe UE placement with RW use  Functional mobility during ADLs: Min guard;Rolling walker (2 wheels)        Cognition Arousal/Alertness: Awake/alert Behavior During  Therapy: WFL for tasks assessed/performed Overall Cognitive Status: Impaired/Different from baseline                                 General Comments: Alert and oriented to self and place. Disoriented to situation. demonstrates some decreased awareness of deficits                   Pertinent Vitals/ Pain       Pain Assessment Pain Assessment: Faces Faces Pain Scale: Hurts little more Pain Location: Back Pain Descriptors / Indicators: Discomfort Pain Intervention(s): Monitored during session, Repositioned         Frequency  Min 2X/week        Progress Toward Goals  OT Goals(current goals can now be found in the care plan section)  Progress towards OT goals: Progressing toward goals  Acute Rehab OT Goals Patient Stated Goal: to get stronger OT Goal Formulation: With family Time For Goal Achievement: 05/14/21 Potential to Achieve Goals: Good  Plan Discharge plan needs to be updated;Frequency remains appropriate       AM-PAC OT "6 Clicks" Daily Activity     Outcome Measure   Help from another person eating meals?: None Help from another person taking care of personal grooming?: A Little Help from another person toileting, which includes using toliet, bedpan, or urinal?: A Little Help from another person bathing (including washing, rinsing, drying)?: A Lot Help from another person to put on and taking off regular upper body clothing?: A Little Help from another person to put on and taking off regular lower body clothing?: A Little 6 Click Score: 18    End of Session Equipment Utilized During Treatment: Rolling walker (2 wheels)  OT Visit Diagnosis: Unsteadiness on feet (R26.81);Muscle weakness (generalized) (M62.81);History of falling (Z91.81);Other symptoms and signs involving cognitive function;Pain Pain - part of body:  (back)   Activity Tolerance Patient tolerated treatment well   Patient Left in chair;with call bell/phone within reach;with  family/visitor present   Nurse Communication Mobility status        Time: 4765-4650 OT Time Calculation (min): 18 min  Charges: OT General Charges $OT Visit: 1 Visit OT Treatments $Self Care/Home Management : 8-22 mins  Fredirick Maudlin, OTR/L [place]

## 2021-05-03 NOTE — Progress Notes (Addendum)
Physical Therapy Treatment Patient Details Name: Hector Mueller MRN: [id number] DOB: 25-Oct-1950 Today's Date: 05/03/2021   History of Present Illness Pt is a 71 y/o M w/ PMH: IIDM, CKD stage IIIb, hep C status post liver transplant, chronic diastolic CHF, A-fib on Coumadin, and pulmonary hypertension who was BIB EMS from home d/t fall-found down in bathroom covered in urine and stool. Pt w/u for altered mental status including suspicion of aspiration pneumonitis, metabolic encephalopathy, and DKA.    PT Comments    Patient received in bed, brother in law at bedside.  He is agreeable to PT session. Patient is mod independent with bed mobility, transfers with supervision. Cues for safe hand placement. Patient ambulated 2 laps around nurses station with RW and min guard. Some fatigue with this, but no significant difficulty or safety concerns. Patient may be able to return home at discharge if he has assistance at home. He will continue to benefit from skilled PT while here to improve strength, safety and functional independence.         Recommendations for follow up therapy are one component of a multi-disciplinary discharge planning process, led by the attending physician.  Recommendations may be updated based on patient status, additional functional criteria and insurance authorization.  Follow Up Recommendations  Home health PT Can go home if he has assistance.      Assistance Recommended at Discharge Intermittent Supervision/Assistance  Patient can return home with the following A little help with bathing/dressing/bathroom;A little help with walking and/or transfers;Help with stairs or ramp for entrance;Assistance with cooking/housework;Assist for transportation   Equipment Recommendations  Rolling walker (2 wheels)    Recommendations for Other Services       Precautions / Restrictions Precautions Precautions: Fall Restrictions Weight Bearing Restrictions: No     Mobility  Bed  Mobility Overal bed mobility: Modified Independent Bed Mobility: Supine to Sit     Supine to sit: Modified independent (Device/Increase time)     General bed mobility comments: no assist needed    Transfers Overall transfer level: Needs assistance Equipment used: Rolling walker (2 wheels) Transfers: Sit to/from Stand Sit to Stand: Supervision           General transfer comment: Cues for hand placement. no Physical assist needed    Ambulation/Gait Ambulation/Gait assistance: Min guard Gait Distance (Feet): 300 Feet Assistive device: Rolling walker (2 wheels) Gait Pattern/deviations: Step-through pattern, Decreased stride length, Trunk flexed Gait velocity: slightly reduced     General Gait Details: Ambulated 2x around nurses station. No lob, fatigued after this distance.   Stairs             Wheelchair Mobility    Modified Rankin (Stroke Patients Only)       Balance Overall balance assessment: Needs assistance Sitting-balance support: Feet supported Sitting balance-Leahy Scale: Good     Standing balance support: Bilateral upper extremity supported, During functional activity, Reliant on assistive device for balance Standing balance-Leahy Scale: Fair                              Cognition Arousal/Alertness: Awake/alert Behavior During Therapy: WFL for tasks assessed/performed Overall Cognitive Status: Within Functional Limits for tasks assessed                                          Exercises Other Exercises Other  Exercises: LAQ x 20 reps, seated marching x 10 reps    General Comments        Pertinent Vitals/Pain Pain Assessment Pain Assessment: 0-10 Pain Score: 8  Pain Location: Back Pain Descriptors / Indicators: Discomfort Pain Intervention(s): Monitored during session, Repositioned    Home Living                          Prior Function            PT Goals (current goals can now be  found in the care plan section) Acute Rehab PT Goals Patient Stated Goal: to improve PT Goal Formulation: With patient Time For Goal Achievement: 05/13/21 Potential to Achieve Goals: Good Progress towards PT goals: Progressing toward goals    Frequency    Min 2X/week      PT Plan Discharge plan needs to be updated    Co-evaluation              AM-PAC PT "6 Clicks" Mobility   Outcome Measure  Help needed turning from your back to your side while in a flat bed without using bedrails?: None Help needed moving from lying on your back to sitting on the side of a flat bed without using bedrails?: A Little Help needed moving to and from a bed to a chair (including a wheelchair)?: A Little Help needed standing up from a chair using your arms (e.g., wheelchair or bedside chair)?: A Little Help needed to walk in hospital room?: A Little Help needed climbing 3-5 steps with a railing? : A Lot 6 Click Score: 18    End of Session Equipment Utilized During Treatment: Gait belt Activity Tolerance: Patient tolerated treatment well Patient left: in chair;with call bell/phone within reach;with family/visitor present Nurse Communication: Mobility status PT Visit Diagnosis: Muscle weakness (generalized) (M62.81);Difficulty in walking, not elsewhere classified (R26.2)     Time: 3435-6861 PT Time Calculation (min) (ACUTE ONLY): 27 min  Charges:  $Gait Training: 8-22 mins $Therapeutic Exercise: 8-22 mins                     Tierra Divelbiss, PT, GCS 05/03/21,2:18 PM

## 2021-05-03 NOTE — TOC Progression Note (Addendum)
Transition of Care [facility]) - Progression Note    Patient Details  Name: Hector Mueller MRN: [id number] Date of Birth: [birth year]  Transition of Care [facility]) CM/SW [place], RN Phone Number: 05/03/2021, 2:51 PM  Clinical Narrative:   RNCM in to see patient and family.  PT and OT changed recommendations to home health, which pt and family are amenable to  at this time.  Corene Cornea from [place] in to see patient, accepted patient for PT services.  Patient lives alone, but family is close by, and state that patient will have support for at least 3 family members with his care/transportation/medication needs.  Patient and family all feel comfortable with patient returning home at discharge. TOC contact information provided to patient and family, TOC to follow.  Addendum 1608:  Patient states he has all DME and does not require any at home.  He has walker and BSC     Barriers to Discharge: Continued Medical Work up  Expected Discharge Plan and Services         Living arrangements for the past 2 months: Single Family Home                                       Social Determinants of Health (SDOH) Interventions    Readmission Risk Interventions Readmission Risk Prevention Plan 04/30/2021 08/15/2020  Transportation Screening Complete Complete  PCP or Specialist Appt within 5-7 Days Complete -  PCP or Specialist Appt within 3-5 Days - Complete  Home Care Screening Complete -  Medication Review (RN CM) Complete -  HRI or [place] - Complete  Social Work Consult for [place] Planning/Counseling - Complete  Palliative Care Screening - Not Applicable  Medication Review Press photographer) - Complete  Some recent data might be hidden

## 2021-05-03 NOTE — Progress Notes (Signed)
Progress Note    Hector Mueller  KWI:[id number] DOB: [birth year]  DOA: 04/28/2021 PCP: [name], MD      Brief Narrative:    Medical records reviewed and are as summarized below:  Hector Mueller is a 71 y.o. 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  Hector Mueller wife passed away in Mar 22, 2021.  Hector Mueller was brought to the hospital because of confusion/lethargy.  Reportedly, Hector Mueller was found on the floor by family member.    Hector Mueller was found to have DKA.  Hector Mueller was treated with IV fluids and IV insulin infusion.  Initially, sepsis was suspected so Hector Mueller was treated with empiric IV antibiotics as well.  However, sepsis was ruled out so antibiotics were discontinued.   Assessment/Plan:   Principal Problem:   DKA (diabetic ketoacidosis) (HCC)   Body mass index is 23.63 kg/m.  DKA, type II DM: Continue insulin glargine and NovoLog as needed for hyperglycemia.  Hemoglobin A1c was 10.5.  Acute metabolic encephalopathy: Resolved.  Ammonia level was normal.  Repeat CT head did not show any acute abnormality.    CKD stage 3b: Creatinine is stable.  Paroxysmal atrial fibrillation: Continue metoprolol and amiodarone.  Hector Mueller is no longer on Coumadin.  INR is subtherapeutic.  Chronic diastolic CHF, liver cirrhosis: Compensated  Low normal vitamin B12 level (206): Continue vitamin B12 supplement  PT and OT recommend discharge to SNF.  Other comorbidities include pulmonary hypertension, hypertension, anxiety, depression, history of liver transplant on cyclosporine  Awaiting placement to SNF. Plan discussed with Barbaraann Rondo, brother-in-law, at the bedside.    Diet Order             Diet Carb Modified Fluid consistency: Thin; Room service appropriate? Yes  Diet effective now                     Consultants: None  Procedures: None    Medications:    amiodarone  200 mg Oral Daily   chlorhexidine  15 mL Mouth Rinse BID   Chlorhexidine Gluconate Cloth  6 each Topical Daily   cycloSPORINE modified  175 mg Oral QPM   cycloSPORINE modified  200 mg Oral Daily   DULoxetine  60 mg Oral QHS   guaiFENesin  1,200 mg Oral BID   heparin  5,000 Units Subcutaneous Q12H   insulin aspart  0-5 Units Subcutaneous QHS   insulin aspart  0-9 Units Subcutaneous TID WC   insulin glargine-yfgn  20 Units Subcutaneous Daily   mouth rinse  15 mL Mouth Rinse q12n4p   melatonin  5 mg Oral QHS   metoprolol succinate  25 mg Oral Daily   mirtazapine  15 mg Oral QHS   pantoprazole  40 mg Oral BID   pregabalin  75 mg Oral BID   sildenafil  40 mg Oral TID   vitamin B-12  1,000 mcg Oral Daily   Continuous Infusions:     Anti-infectives (From admission, onward)    Start     Dose/Rate Route Frequency Ordered Stop   04/29/21 0400  cefTRIAXone (ROCEPHIN) 2 g in sodium chloride 0.9 % 100 mL IVPB  Status:  Discontinued        2 g 200 mL/hr over 30 Minutes Intravenous Every 24 hours 04/28/21 1825 04/29/21 1601   04/28/21 2000  azithromycin (ZITHROMAX) 500 mg in sodium chloride 0.9 % 250 mL IVPB  Status:  Discontinued        500 mg 250 mL/hr over 60 Minutes Intravenous Every 24 hours 04/28/21 1859 04/29/21 1601   04/28/21 1445  ceFEPIme (MAXIPIME) 2 g in sodium chloride 0.9 % 100 mL IVPB        2 g 200 mL/hr over 30 Minutes Intravenous  Once 04/28/21 1443 04/28/21 1520   04/28/21 1445  metroNIDAZOLE (FLAGYL) IVPB 500 mg        500 mg 100 mL/hr over 60 Minutes Intravenous  Once 04/28/21 1443 04/28/21 1603   04/28/21 1445  vancomycin (VANCOCIN) IVPB 1000 mg/200 mL premix        1,000 mg 200 mL/hr over 60 Minutes Intravenous  Once 04/28/21 1443 04/28/21 1556              Family Communication/Anticipated D/C date and plan/Code Status   DVT prophylaxis: heparin injection 5,000 Units  Start: 04/28/21 2200     Code Status: Full Code  Family Communication: Barbaraann Rondo (brother-in-law)  Disposition Plan: Plan to discharge to SNF   Status is: Inpatient Remains inpatient appropriate because: Awaiting placement to SNF               Subjective:   No acute events noted.  Hector Mueller feels better.  Hector Mueller has no complaints.  Hector Mueller brother-in-law was at the bedside.  Objective:    Vitals:   05/02/21 2137 05/03/21 0003 05/03/21 0334 05/03/21 0758  BP: (!) 85/61 (!) 85/46 93/69 (!) 113/54  Pulse: (!) 53 (!) 55 (!) 55 (!) 50  Resp:  18 17 16   Temp:  (!) 97.3 F (36.3 C) 97.6 F (36.4 C) 97.8 F (36.6 C)  TempSrc:  Oral Oral Oral  SpO2:  (!) 88% 96% 98%  Weight:      Height:       No data found.   Intake/Output Summary (Last 24 hours) at 05/03/2021 1057 Last data filed at 05/03/2021 1028 Gross per 24 hour  Intake 720 ml  Output --  Net 720 ml   Filed Weights   04/28/21 1438  Weight: 72.6 kg    Exam:  GEN: NAD SKIN: No rash EYES: EOMI ENT: MMM CV: RRR PULM: CTA B ABD: soft, ND, NT, +BS CNS: AAO x 3, non focal EXT: No edema or tenderness       Data Reviewed:   I have personally reviewed following labs and imaging studies:  Labs: Labs show the following:   Basic Metabolic Panel: Recent Labs  Lab 04/28/21 1900 04/28/21 2043 04/29/21 0118 04/29/21 0414 04/29/21 0906 05/01/21 0536  NA  --  136 136 139 137 134*  K  --  3.8 3.7 3.4* 3.6 3.4*  CL  --  106 107 106 105 103  CO2  --  20* 24 24 25 23   GLUCOSE  --  298* 210* 227* 201* 145*  BUN  --  41* 39* 40* 41* 41*  CREATININE  --  1.97* 2.04* 2.06* 2.11* 1.98*  CALCIUM  --  9.5 9.5 9.3 9.3 9.1  MG 2.1 2.2  --   --   --   --   PHOS  --  1.8*  --   --   --   --    GFR Estimated Creatinine Clearance: 33.2 mL/min (A) (by C-G formula based on SCr of 1.98 mg/dL (H)). Liver Function Tests: Recent Labs  Lab 04/28/21 1442  AST 28  ALT 21  ALKPHOS 92  BILITOT 2.1*  PROT 8.4*  ALBUMIN 4.8    Recent Labs  Lab 04/28/21 1442  LIPASE 25   Recent Labs  Lab 04/28/21 1619 05/01/21 1122  AMMONIA 17 <10   Coagulation profile Recent Labs  Lab 04/28/21 1442  INR 1.0    CBC: Recent Labs  Lab 04/28/21 1442  WBC 12.0*  NEUTROABS 10.5*  HGB 16.0  HCT 47.1  MCV 90.6  PLT 134*   Cardiac Enzymes: Recent Labs  Lab 04/28/21 1900  CKTOTAL 604*   BNP (last 3 results) No results for input(s): PROBNP in the last 8760 hours. CBG: Recent Labs  Lab 05/02/21 0740 05/02/21 1141 05/02/21 1549 05/02/21 2044 05/03/21 0758  GLUCAP 139* 116* 190* 269* 208*   D-Dimer: No results for input(s): DDIMER in the last 72 hours. Hgb A1c: No results for input(s): HGBA1C in the last 72 hours.  Lipid Profile: No results for input(s): CHOL, HDL, LDLCALC, TRIG, CHOLHDL, LDLDIRECT in the last 72 hours. Thyroid function studies: No results for input(s): TSH, T4TOTAL, T3FREE, THYROIDAB in the last 72 hours.  Invalid input(s): FREET3  Anemia work up: No results for input(s): VITAMINB12, FOLATE, FERRITIN, TIBC, IRON, RETICCTPCT in the last 72 hours.  Sepsis Labs: Recent Labs  Lab 04/28/21 1442 04/28/21 1642 04/28/21 1902 04/28/21 2314 04/29/21 0414 04/30/21 0455  PROCALCITON 0.33  --   --   --  0.60 0.39  WBC 12.0*  --   --   --   --   --   LATICACIDVEN 2.1* 2.4* 2.5* 1.8  --   --     Microbiology Recent Results (from the past 240 hour(s))  Blood Culture (routine x 2)     Status: None   Collection Time: 04/28/21  2:41 PM   Specimen: BLOOD  Result Value Ref Range Status   Specimen Description BLOOD RIGHT ANTECUBITAL  Final   Special Requests BOTTLES DRAWN AEROBIC AND ANAEROBIC BCLV  Final   Culture   Final    NO GROWTH 5 DAYS Performed at [facility], [address] [place], [place] [address]    Report Status 05/03/2021 FINAL  Final  Resp Panel by RT-PCR (Flu A&B, Covid) Nasopharyngeal Swab     Status: None   Collection Time: 04/28/21  2:42 PM    Specimen: Nasopharyngeal Swab; Nasopharyngeal(NP) swabs in vial transport medium  Result Value Ref Range Status   SARS Coronavirus 2 by RT PCR NEGATIVE NEGATIVE Final    Comment: (NOTE) SARS-CoV-2 target nucleic acids are NOT DETECTED.  The SARS-CoV-2 RNA is generally detectable in upper respiratory specimens during the acute phase of infection. The lowest concentration of SARS-CoV-2 viral copies this assay can detect is 138 copies/mL. A negative result does not preclude SARS-Cov-2 infection and should not be used as the sole basis for treatment or other patient management decisions. A negative result may occur with  improper specimen collection/handling, submission of specimen other than nasopharyngeal swab, presence of viral mutation(s) within the areas targeted by this assay, and inadequate number of viral copies(<138 copies/mL). A negative result must be combined with clinical observations, patient history, and epidemiological information. The expected result is Negative.  Fact Sheet for Patients:  EntrepreneurPulse.com.au  Fact Sheet for Healthcare Providers:  IncredibleEmployment.be  This test is no t yet approved or cleared by the [place] FDA and  has been authorized for detection and/or diagnosis of SARS-CoV-2 by FDA under an Emergency Use Authorization (EUA). This EUA will remain  in effect (meaning this test can be used) for the duration of the COVID-19 declaration under Section 564(b)(1) of the Act, 21 U.S.C.section 360bbb-3(b)(1), unless the authorization is terminated  or revoked sooner.       Influenza A by PCR NEGATIVE NEGATIVE Final   Influenza B by PCR NEGATIVE NEGATIVE Final    Comment: (NOTE) The Xpert Xpress SARS-CoV-2/FLU/RSV plus assay is intended as an aid in the diagnosis of influenza from Nasopharyngeal swab specimens and should not be used as a sole basis for treatment. Nasal washings and aspirates are  unacceptable for Xpert Xpress SARS-CoV-2/FLU/RSV testing.  Fact Sheet for Patients: EntrepreneurPulse.com.au  Fact Sheet for Healthcare Providers: IncredibleEmployment.be  This test is not yet approved or cleared by the [place] FDA and has been authorized for detection and/or diagnosis of SARS-CoV-2 by FDA under an Emergency Use Authorization (EUA). This EUA will remain in effect (meaning this test can be used) for the duration of the COVID-19 declaration under Section 564(b)(1) of the Act, 21 U.S.C. section 360bbb-3(b)(1), unless the authorization is terminated or revoked.  Performed at [facility], [address] [place], [place] [address]   Blood Culture (routine x 2)     Status: None   Collection Time: 04/28/21  2:42 PM   Specimen: BLOOD  Result Value Ref Range Status   Specimen Description BLOOD LEFT ANTECUBITAL  Final   Special Requests   Final    BOTTLES DRAWN AEROBIC AND ANAEROBIC Blood Culture results may not be optimal due to an inadequate volume of blood received in culture bottles   Culture   Final    NO GROWTH 5 DAYS Performed at [facility], [address], [place], [place] [address]    Report Status 05/03/2021 FINAL  Final  Urine Culture     Status: None   Collection Time: 04/28/21  4:19 PM   Specimen: In/Out Cath Urine  Result Value Ref Range Status   Specimen Description   Final    IN/OUT CATH URINE Performed at [facility], [address] [place], [place] [address]    Special Requests   Final    NONE Performed at [facility], [address] [place], [place] [address]    Culture   Final    NO GROWTH Performed at [facility], [place] [address], [place], [place] [address]    Report Status 04/30/2021 FINAL  Final  MRSA Next Gen by PCR, Nasal     Status: None   Collection Time: 04/28/21  7:45 PM   Specimen: Nasal Mucosa; Nasal Swab  Result Value Ref Range Status    MRSA by PCR Next Gen NOT DETECTED NOT DETECTED Final    Comment: (NOTE) The GeneXpert MRSA Assay (FDA approved for NASAL specimens only), is one component of a comprehensive MRSA colonization surveillance program. It is not intended to diagnose MRSA infection nor to guide or monitor treatment for MRSA infections. Test performance is not FDA approved in patients less than 35 years old. Performed at [facility], [address] [place], [place] [address]     Procedures and diagnostic studies:  No results found.             LOS: 5 days   Bayron Dalto  [facility]   Pager on [email or web address]. If 7PM-7AM, please contact night-coverage at [email or web address]     05/03/2021, 10:57 AM

## 2021-05-03 NOTE — Discharge Summary (Signed)
Physician Discharge Summary  Hector Mueller TUU:[id number] DOB: April 29, 1950 DOA: 04/28/2021  PCP: [name], MD  Admit date: 04/28/2021 Discharge date: 05/03/2021  Discharge disposition: Home with home health therapy   Recommendations for Outpatient Follow-Up:   Follow up with PCP in 1 to 2 weeks   Discharge Diagnosis:   Principal Problem:   DKA (diabetic ketoacidosis) ([place])    Discharge Condition: Stable.  Diet recommendation:  Diet Order             Diet - low sodium heart healthy           Diet Carb Modified Fluid consistency: Thin; Room service appropriate? Yes  Diet effective now                     Code Status: Full Code     Hospital Course:   Mr. Hector Mueller is a 71 y.o. 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  His wife passed away in 03-09-21.  He was brought to the hospital because of confusion/lethargy.  Reportedly, he was found on the floor by family member.     He was found to have DKA.  He was treated with IV fluids and IV insulin infusion.  He was transitioned to insulin glargine.  Patient said he had not been taking Basaglar that had been previously prescribed because it became too expensive.  He has decided that he will restart insulin at discharge.  Initially, sepsis was suspected so he was treated with empiric IV antibiotics as well.  However, sepsis was ruled out so antibiotics were discontinued.  Initially, PT and OT recommended discharge to SNF.  However with continued PT and OT in the hospital, his condition improved and home health therapy was recommended.  He is deemed stable for discharge today.  Discharge plan was discussed with Barbaraann Rondo, his friend.       Discharge Exam:    Vitals:   05/03/21 0003 05/03/21 0334 05/03/21 0758  05/03/21 1144  BP: (!) 85/46 93/69 (!) 113/54 (!) 106/51  Pulse: (!) 55 (!) 55 (!) 50 (!) 58  Resp: 18 17 16 16   Temp: (!) 97.3 F (36.3 C) 97.6 F (36.4 C) 97.8 F (36.6 C) 98.2 F (36.8 C)  TempSrc: Oral Oral Oral Oral  SpO2: (!) 88% 96% 98% 100%  Weight:      Height:         GEN: NAD SKIN: Warm and dry EYES: No pallor or icterus ENT: MMM CV: RRR PULM: CTA B ABD: soft, ND, NT, +BS CNS: AAO x 3, non focal EXT: No edema or tenderness   The results of significant diagnostics from this hospitalization (including imaging, microbiology, ancillary and laboratory) are listed below for reference.     Procedures and Diagnostic Studies:   DG Lumbar Spine 2-3 Views  Result Date: 04/28/2021 CLINICAL DATA:  Fall and altered mental status. EXAM: LUMBAR SPINE - 2-3 VIEW COMPARISON:  CT abdomen and pelvis 06/18/2020 FINDINGS: There is diffuse decreased bone mineralization. Bilateral L5-S1 transpedicular rod and screw fusion hardware is again seen. Grade 2 anterolisthesis of L5 on S1 appears similar to prior. Minimal retrolisthesis of L2 on L3 unchanged. Mild T12 vertebral body height loss is unchanged and chronic. There is bilateral Harrington rod fusion hardware spanning the T10 through  superior L3 levels. Moderate L5-S1 disc space narrowing. IMPRESSION:: IMPRESSION: 1. Unchanged grade 2 anterolisthesis of L5 on S1 with intact posterior fusion hardware. 2. Posterior thoracolumbar Harrington rod fusion hardware. 3. Moderate L5-S1 disc space narrowing. Electronically Signed   By: Yvonne Kendall M.D.   On: 04/28/2021 18:27   CT Head Wo Contrast  Result Date: 04/28/2021 CLINICAL DATA:  Fall.  Mental status change EXAM: CT HEAD WITHOUT CONTRAST CT CERVICAL SPINE WITHOUT CONTRAST TECHNIQUE: Multidetector CT imaging of the head and cervical spine was performed following the standard protocol without intravenous contrast. Multiplanar CT image reconstructions of the cervical spine were also generated.  RADIATION DOSE REDUCTION: This exam was performed according to the departmental dose-optimization program which includes automated exposure control, adjustment of the mA and/or kV according to patient size and/or use of iterative reconstruction technique. COMPARISON:  CT head 11/27/2017 FINDINGS: CT HEAD FINDINGS Brain: Generalized atrophy. Mild to moderate white matter hypodensity bilaterally, chronic and unchanged. Negative for acute infarct, hemorrhage, mass Vascular: Negative for hyperdense vessel Skull: Negative Sinuses/Orbits: Mild mucosal edema paranasal sinuses. Bilateral cataract extraction Other: None CT CERVICAL SPINE FINDINGS Alignment: Mild retrolisthesis C3-4 and C4-5 Skull base and vertebrae: Negative for fracture Soft tissues and spinal canal: Negative for soft tissue mass or adenopathy. Disc levels: Disc degeneration and spurring is present in the cervical spine. There is moderate foraminal narrowing bilaterally at C4-5 and C5-6 due to spurring mild spinal stenosis at C4-5. Upper chest: Lung apices clear bilaterally Other: None IMPRESSION: 1. No acute intracranial abnormality. Atrophy with chronic microvascular ischemia 2. Negative for cervical fracture. Electronically Signed   By: Franchot Gallo M.D.   On: 04/28/2021 15:25   CT Cervical Spine Wo Contrast  Result Date: 04/28/2021 CLINICAL DATA:  Fall.  Mental status change EXAM: CT HEAD WITHOUT CONTRAST CT CERVICAL SPINE WITHOUT CONTRAST TECHNIQUE: Multidetector CT imaging of the head and cervical spine was performed following the standard protocol without intravenous contrast. Multiplanar CT image reconstructions of the cervical spine were also generated. RADIATION DOSE REDUCTION: This exam was performed according to the departmental dose-optimization program which includes automated exposure control, adjustment of the mA and/or kV according to patient size and/or use of iterative reconstruction technique. COMPARISON:  CT head 11/27/2017  FINDINGS: CT HEAD FINDINGS Brain: Generalized atrophy. Mild to moderate white matter hypodensity bilaterally, chronic and unchanged. Negative for acute infarct, hemorrhage, mass Vascular: Negative for hyperdense vessel Skull: Negative Sinuses/Orbits: Mild mucosal edema paranasal sinuses. Bilateral cataract extraction Other: None CT CERVICAL SPINE FINDINGS Alignment: Mild retrolisthesis C3-4 and C4-5 Skull base and vertebrae: Negative for fracture Soft tissues and spinal canal: Negative for soft tissue mass or adenopathy. Disc levels: Disc degeneration and spurring is present in the cervical spine. There is moderate foraminal narrowing bilaterally at C4-5 and C5-6 due to spurring mild spinal stenosis at C4-5. Upper chest: Lung apices clear bilaterally Other: None IMPRESSION: 1. No acute intracranial abnormality. Atrophy with chronic microvascular ischemia 2. Negative for cervical fracture. Electronically Signed   By: Franchot Gallo M.D.   On: 04/28/2021 15:25   DG Pelvis Portable  Result Date: 04/28/2021 CLINICAL DATA:  Recent fall.  Altered mental status. EXAM: PORTABLE PELVIS 1-2 VIEWS COMPARISON:  Pelvis and right hip radiographs 11/27/2017 FINDINGS: Interval cephalomedullary nail fixation of the previously seen right intertrochanteric fracture. Improved alignment. Within the limitations of severely decreased bone mineralization no definite acute fracture is seen. Moderate bilateral femoroacetabular joint space narrowing. L5-S1 posterior rod and screw fusion hardware. IMPRESSION:: IMPRESSION: 1. Within  the limitations of diffuse decreased bone mineralization, no definite acute fracture is seen. 2. Interval fixation of right intertrochanteric fracture. Electronically Signed   By: Yvonne Kendall M.D.   On: 04/28/2021 18:23   DG Chest Port 1 View  Result Date: 04/29/2021 CLINICAL DATA:  Diabetic ketoacidosis. EXAM: PORTABLE CHEST 1 VIEW COMPARISON:  04/28/2021 FINDINGS: The heart size and mediastinal contours  are within normal limits. Both lungs are clear. Spinal fusion hardware is again noted and is partially imaged. IMPRESSION: No active disease. Electronically Signed   By: Kerby Moors M.D.   On: 04/29/2021 08:49   DG Chest Port 1 View  Result Date: 04/28/2021 CLINICAL DATA:  Fall EXAM: PORTABLE CHEST 1 VIEW COMPARISON:  Chest radiograph 08/10/2020 FINDINGS: The cardiomediastinal silhouette is stable. A loop recorder is again seen. There is no focal consolidation or pulmonary edema. There is no pleural effusion or pneumothorax. Spinal fusion hardware is partially imaged. There is no acute osseous abnormality. IMPRESSION: Stable chest with no radiographic evidence of acute cardiopulmonary process. Electronically Signed   By: Valetta Mole M.D.   On: 04/28/2021 15:05     Labs:   Basic Metabolic Panel: Recent Labs  Lab 04/28/21 1900 04/28/21 2043 04/29/21 0118 04/29/21 0414 04/29/21 0906 05/01/21 0536  NA  --  136 136 139 137 134*  K  --  3.8 3.7 3.4* 3.6 3.4*  CL  --  106 107 106 105 103  CO2  --  20* 24 24 25 23   GLUCOSE  --  298* 210* 227* 201* 145*  BUN  --  41* 39* 40* 41* 41*  CREATININE  --  1.97* 2.04* 2.06* 2.11* 1.98*  CALCIUM  --  9.5 9.5 9.3 9.3 9.1  MG 2.1 2.2  --   --   --   --   PHOS  --  1.8*  --   --   --   --    GFR Estimated Creatinine Clearance: 33.2 mL/min (A) (by C-G formula based on SCr of 1.98 mg/dL (H)). Liver Function Tests: Recent Labs  Lab 04/28/21 1442  AST 28  ALT 21  ALKPHOS 92  BILITOT 2.1*  PROT 8.4*  ALBUMIN 4.8   Recent Labs  Lab 04/28/21 1442  LIPASE 25   Recent Labs  Lab 04/28/21 1619 05/01/21 1122  AMMONIA 17 <10   Coagulation profile Recent Labs  Lab 04/28/21 1442  INR 1.0    CBC: Recent Labs  Lab 04/28/21 1442  WBC 12.0*  NEUTROABS 10.5*  HGB 16.0  HCT 47.1  MCV 90.6  PLT 134*   Cardiac Enzymes: Recent Labs  Lab 04/28/21 1900  CKTOTAL 604*   BNP: Invalid input(s): POCBNP CBG: Recent Labs  Lab  05/02/21 1141 05/02/21 1549 05/02/21 2044 05/03/21 0758 05/03/21 1231  GLUCAP 116* 190* 269* 208* 114*   D-Dimer No results for input(s): DDIMER in the last 72 hours. Hgb A1c No results for input(s): HGBA1C in the last 72 hours. Lipid Profile No results for input(s): CHOL, HDL, LDLCALC, TRIG, CHOLHDL, LDLDIRECT in the last 72 hours. Thyroid function studies No results for input(s): TSH, T4TOTAL, T3FREE, THYROIDAB in the last 72 hours.  Invalid input(s): FREET3 Anemia work up No results for input(s): VITAMINB12, FOLATE, FERRITIN, TIBC, IRON, RETICCTPCT in the last 72 hours. Microbiology Recent Results (from the past 240 hour(s))  Blood Culture (routine x 2)     Status: None   Collection Time: 04/28/21  2:41 PM   Specimen: BLOOD  Result Value  Ref Range Status   Specimen Description BLOOD RIGHT ANTECUBITAL  Final   Special Requests BOTTLES DRAWN AEROBIC AND ANAEROBIC BCLV  Final   Culture   Final    NO GROWTH 5 DAYS Performed at [facility], [address] [place], [place] [address]    Report Status 05/03/2021 FINAL  Final  Resp Panel by RT-PCR (Flu A&B, Covid) Nasopharyngeal Swab     Status: None   Collection Time: 04/28/21  2:42 PM   Specimen: Nasopharyngeal Swab; Nasopharyngeal(NP) swabs in vial transport medium  Result Value Ref Range Status   SARS Coronavirus 2 by RT PCR NEGATIVE NEGATIVE Final    Comment: (NOTE) SARS-CoV-2 target nucleic acids are NOT DETECTED.  The SARS-CoV-2 RNA is generally detectable in upper respiratory specimens during the acute phase of infection. The lowest concentration of SARS-CoV-2 viral copies this assay can detect is 138 copies/mL. A negative result does not preclude SARS-Cov-2 infection and should not be used as the sole basis for treatment or other patient management decisions. A negative result may occur with  improper specimen collection/handling, submission of specimen other than nasopharyngeal swab, presence of viral  mutation(s) within the areas targeted by this assay, and inadequate number of viral copies(<138 copies/mL). A negative result must be combined with clinical observations, patient history, and epidemiological information. The expected result is Negative.  Fact Sheet for Patients:  EntrepreneurPulse.com.au  Fact Sheet for Healthcare Providers:  IncredibleEmployment.be  This test is no t yet approved or cleared by the [place] FDA and  has been authorized for detection and/or diagnosis of SARS-CoV-2 by FDA under an Emergency Use Authorization (EUA). This EUA will remain  in effect (meaning this test can be used) for the duration of the COVID-19 declaration under Section 564(b)(1) of the Act, 21 U.S.C.section 360bbb-3(b)(1), unless the authorization is terminated  or revoked sooner.       Influenza A by PCR NEGATIVE NEGATIVE Final   Influenza B by PCR NEGATIVE NEGATIVE Final    Comment: (NOTE) The Xpert Xpress SARS-CoV-2/FLU/RSV plus assay is intended as an aid in the diagnosis of influenza from Nasopharyngeal swab specimens and should not be used as a sole basis for treatment. Nasal washings and aspirates are unacceptable for Xpert Xpress SARS-CoV-2/FLU/RSV testing.  Fact Sheet for Patients: EntrepreneurPulse.com.au  Fact Sheet for Healthcare Providers: IncredibleEmployment.be  This test is not yet approved or cleared by the [place] FDA and has been authorized for detection and/or diagnosis of SARS-CoV-2 by FDA under an Emergency Use Authorization (EUA). This EUA will remain in effect (meaning this test can be used) for the duration of the COVID-19 declaration under Section 564(b)(1) of the Act, 21 U.S.C. section 360bbb-3(b)(1), unless the authorization is terminated or revoked.  Performed at [facility], [address] [place], [place] [address]   Blood Culture (routine x 2)      Status: None   Collection Time: 04/28/21  2:42 PM   Specimen: BLOOD  Result Value Ref Range Status   Specimen Description BLOOD LEFT ANTECUBITAL  Final   Special Requests   Final    BOTTLES DRAWN AEROBIC AND ANAEROBIC Blood Culture results may not be optimal due to an inadequate volume of blood received in culture bottles   Culture   Final    NO GROWTH 5 DAYS Performed at [facility], [address], [place], [place] [address]    Report Status 05/03/2021 FINAL  Final  Urine Culture     Status: None  Collection Time: 04/28/21  4:19 PM   Specimen: In/Out Cath Urine  Result Value Ref Range Status   Specimen Description   Final    IN/OUT CATH URINE Performed at [facility], [address] [place], [place] [address]    Special Requests   Final    NONE Performed at [facility], [address], [place], [place] [address]    Culture   Final    NO GROWTH Performed at [facility], [place] [address], [place], [place] [address]    Report Status 04/30/2021 FINAL  Final  MRSA Next Gen by PCR, Nasal     Status: None   Collection Time: 04/28/21  7:45 PM   Specimen: Nasal Mucosa; Nasal Swab  Result Value Ref Range Status   MRSA by PCR Next Gen NOT DETECTED NOT DETECTED Final    Comment: (NOTE) The GeneXpert MRSA Assay (FDA approved for NASAL specimens only), is one component of a comprehensive MRSA colonization surveillance program. It is not intended to diagnose MRSA infection nor to guide or monitor treatment for MRSA infections. Test performance is not FDA approved in patients less than 62 years old. Performed at [facility], [address], [place], [place] [address]      Discharge Instructions:   Discharge Instructions     Diet - low sodium heart healthy   Complete by: As directed    Discharge wound care:   Complete by: As directed    Follow up with dermatologist for removal of sutures near left clavicle   Increase activity  slowly   Complete by: As directed       Allergies as of 05/03/2021   No Known Allergies      Medication List     STOP taking these medications    COGNEX PO   Ozempic (1 MG/DOSE) 4 MG/3ML Sopn Generic drug: Semaglutide (1 MG/DOSE)       TAKE these medications    alendronate 70 MG tablet Commonly known as: FOSAMAX Take 70 mg by mouth once a week. Take with a full glass of water on an empty stomach.   amiodarone 200 MG tablet Commonly known as: PACERONE Take 1 tablet (200 mg total) by mouth daily. What changed: See the new instructions.   Basaglar KwikPen 100 UNIT/ML Inject 20 Units into the skin daily.   bisacodyl 5 MG EC tablet Commonly known as: DULCOLAX Take 5 mg by mouth daily as needed for moderate constipation.   cyanocobalamin 1000 MCG tablet Take 1 tablet (1,000 mcg total) by mouth daily. Start taking on: May 04, 2021   cycloSPORINE modified 100 MG capsule Commonly known as: NEORAL Take 200 mg by mouth daily.   cycloSPORINE modified 25 MG capsule Commonly known as: NEORAL Take 175 mg by mouth every evening.   DULoxetine 30 MG capsule Commonly known as: CYMBALTA Take 60 mg by mouth at bedtime.   magnesium (amino acid chelate) 133 MG tablet Take 2 tablets by mouth 2 (two) times daily.   melatonin 3 MG Tabs tablet Take 3 mg by mouth at bedtime.   metoprolol succinate 25 MG 24 hr tablet Commonly known as: TOPROL-XL Take 1 tablet (25 mg total) by mouth daily. What changed: when to take this   mirtazapine 15 MG tablet Commonly known as: REMERON Take 15 mg by mouth at bedtime.   ondansetron 4 MG tablet Commonly known as: ZOFRAN Take 1 tablet (4 mg total) by mouth every 6 (six) hours as needed  for nausea.   pantoprazole 40 MG tablet Commonly known as: PROTONIX Take 1 tablet (40 mg total) by mouth 2 (two) times daily.   pregabalin 75 MG capsule Commonly known as: LYRICA Take 75 mg by mouth in the morning and at bedtime.    rosuvastatin 5 MG tablet Commonly known as: CRESTOR Take 5 mg by mouth daily.   sildenafil 20 MG tablet Commonly known as: REVATIO Take 40 mg by mouth 3 (three) times daily.   Vitamin D3 125 MCG (5000 UT) Caps Take 3,000 Units by mouth daily.               Discharge Care Instructions  (From admission, onward)           Start     Ordered   05/03/21 0000  Discharge wound care:       Comments: Follow up with dermatologist for removal of sutures near left clavicle   05/03/21 1523               If you experience worsening of your admission symptoms, develop shortness of breath, life threatening emergency, suicidal or homicidal thoughts you must seek medical attention immediately by calling 911 or calling your MD immediately  if symptoms less severe.   You must read complete instructions/literature along with all the possible adverse reactions/side effects for all the medicines you take and that have been prescribed to you. Take any new medicines after you have completely understood and accept all the possible adverse reactions/side effects.    Please note   You were cared for by a hospitalist during your hospital stay. If you have any questions about your discharge medications or the care you received while you were in the hospital after you are discharged, you can call the unit and asked to speak with the hospitalist on call if the hospitalist that took care of you is not available. Once you are discharged, your primary care physician will handle any further medical issues. Please note that NO REFILLS for any discharge medications will be authorized once you are discharged, as it is imperative that you return to your primary care physician (or establish a relationship with a primary care physician if you do not have one) for your aftercare needs so that they can reassess your need for medications and monitor your lab values.       Time coordinating discharge: Greater  than 30 minutes  Signed:  Paizleigh Wilds  [facility] 05/03/2021, 3:23 PM   Pager on [email or web address]. If 7PM-7AM, please contact night-coverage at [email or web address]

## 2021-05-17 ENCOUNTER — Ambulatory Visit: Admit: 2021-05-17 | Discharge: 2021-05-17 | Payer: MEDICARE

## 2021-05-17 DIAGNOSIS — Z944 Liver transplant status: Principal | ICD-10-CM

## 2021-05-17 DIAGNOSIS — N400 Enlarged prostate without lower urinary tract symptoms: Principal | ICD-10-CM

## 2021-05-17 DIAGNOSIS — Z79899 Other long term (current) drug therapy: Principal | ICD-10-CM

## 2021-05-17 LAB — CBC W/ AUTO DIFF
BASOPHILS ABSOLUTE COUNT: 0 10*9/L (ref 0.0–0.1)
BASOPHILS RELATIVE PERCENT: 1 %
EOSINOPHILS ABSOLUTE COUNT: 0 10*9/L (ref 0.0–0.5)
EOSINOPHILS RELATIVE PERCENT: 0.8 %
HEMATOCRIT: 35.4 % — ABNORMAL LOW (ref 39.0–48.0)
HEMOGLOBIN: 12.1 g/dL — ABNORMAL LOW (ref 12.9–16.5)
LYMPHOCYTES ABSOLUTE COUNT: 1 10*9/L — ABNORMAL LOW (ref 1.1–3.6)
LYMPHOCYTES RELATIVE PERCENT: 21.8 %
MEAN CORPUSCULAR HEMOGLOBIN CONC: 34.1 g/dL (ref 32.0–36.0)
MEAN CORPUSCULAR HEMOGLOBIN: 31.4 pg (ref 25.9–32.4)
MEAN CORPUSCULAR VOLUME: 92.1 fL (ref 77.6–95.7)
MEAN PLATELET VOLUME: 9.3 fL (ref 6.8–10.7)
MONOCYTES ABSOLUTE COUNT: 0.3 10*9/L (ref 0.3–0.8)
MONOCYTES RELATIVE PERCENT: 5.9 %
NEUTROPHILS ABSOLUTE COUNT: 3.4 10*9/L (ref 1.8–7.8)
NEUTROPHILS RELATIVE PERCENT: 70.5 %
NUCLEATED RED BLOOD CELLS: 0 /100{WBCs} (ref <=4)
PLATELET COUNT: 117 10*9/L — ABNORMAL LOW (ref 150–450)
RED BLOOD CELL COUNT: 3.85 10*12/L — ABNORMAL LOW (ref 4.26–5.60)
RED CELL DISTRIBUTION WIDTH: 14.2 % (ref 12.2–15.2)
WBC ADJUSTED: 4.8 10*9/L (ref 3.6–11.2)

## 2021-05-17 LAB — COMPREHENSIVE METABOLIC PANEL
ALBUMIN: 3.6 g/dL (ref 3.4–5.0)
ALKALINE PHOSPHATASE: 75 U/L (ref 46–116)
ALT (SGPT): 11 U/L (ref 10–49)
ANION GAP: 9 mmol/L (ref 5–14)
AST (SGOT): 12 U/L (ref <=34)
BILIRUBIN TOTAL: 0.6 mg/dL (ref 0.3–1.2)
BLOOD UREA NITROGEN: 37 mg/dL — ABNORMAL HIGH (ref 9–23)
BUN / CREAT RATIO: 19
CALCIUM: 9.8 mg/dL (ref 8.7–10.4)
CHLORIDE: 108 mmol/L — ABNORMAL HIGH (ref 98–107)
CO2: 22 mmol/L (ref 20.0–31.0)
CREATININE: 1.93 mg/dL — ABNORMAL HIGH
EGFR CKD-EPI (2021) MALE: 36 mL/min/{1.73_m2} — ABNORMAL LOW (ref >=60)
GLUCOSE RANDOM: 244 mg/dL — ABNORMAL HIGH (ref 70–179)
POTASSIUM: 4.8 mmol/L (ref 3.4–4.8)
PROTEIN TOTAL: 6.5 g/dL (ref 5.7–8.2)
SODIUM: 139 mmol/L (ref 135–145)

## 2021-05-17 LAB — MAGNESIUM: MAGNESIUM: 1.6 mg/dL (ref 1.6–2.6)

## 2021-05-17 LAB — GAMMA GT: GAMMA GLUTAMYL TRANSFERASE: 27 U/L

## 2021-05-17 LAB — BILIRUBIN, DIRECT: BILIRUBIN DIRECT: 0.2 mg/dL (ref 0.00–0.30)

## 2021-05-17 LAB — PHOSPHORUS: PHOSPHORUS: 3.8 mg/dL (ref 2.4–5.1)

## 2021-05-17 NOTE — Unmapped (Signed)
Tell Dr Graciela Husbands I said hello.

## 2021-05-17 NOTE — Unmapped (Signed)
Post Liver Transplant Follow-Up    Transplant Date: 09/15/2016 (Liver)    Called pt to prep for scheduled post liver txp follow up with NP Metheny this afternoon at [facility] clinic. Pt to go to [facility] center today to obtain labs as last on file from Feb'22. Pt reported blood sugar fluctuations, difficulty urinating, and SOB on exertion as medical issues in the last year. Pt has an established PCP Graciela Husbands), pulmonologist Ala Dach), endo Yetta Barre), cards Kathi Ludwig) & derm [facility]) for routine needs. Labs obtained at [facility] center, IS refilled via [facility] pharmacy. Medication list reconciled and reviewed. Pt not interested in any vaccinations at this time, last colonoscopy in 2016. Denied EtOH, tobacco, recreational drug use. Provided patient education reviewing the following with pt: preventative care, vaccinations, nutrition, over the counter medications, and food/herbal products to avoid. Pt verbalized understanding of all topics discussed.    Post-Transplant Surveillance for Hepatocellular Carcinoma    RETREAT Score:   0: No HCC surveillance required.      Characteristic Number of Points   #1 AFP (at time of transplant) 0-20 0    21-99 1    100-999 2    ?1000 3   #2 Microvascular invasion* No 0    Yes 2   #3 Number of viable tumors + largest viable tumor diameter 0 0    1.1-4.9 1    5.0-9.9 2    ?10 3   * NOTE: macrovascular invasion or lymph node invasion on explant pathology will automatically qualify as >5 points

## 2021-05-18 LAB — CYCLOSPORINE, TROUGH: CYCLOSPORINE, TROUGH: 72 ng/mL — ABNORMAL LOW (ref 100–400)

## 2021-05-19 NOTE — Unmapped (Signed)
FOLLOW UP ANNUAL LIVER CLINIC NOTE     Patient Name: Jeffrey Ward  Medical Record Number: [id number]  Date of Service: 05/19/2021    Referring Physician: Launa Flight   Current complaint: Follow up Annual Liver    Assessment/Plan:     Jeffrey Ward is a 71 y.o. 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  Recent imaging stable with continued abscess improvement, no HCC. Patient has been taking 200mg /175mg  of cyclosporine and 500mg  BID of cellcept; immunosuppressive level goal range (100-125).       Wife passed in December 2022, dealing with this as expected.     HEALTH MAINTENANCE:   - Dermatology: This patient is at increased risk for the development of skin cancers due to immunosuppressant medications. We recommend yearly surveillance.  - Colonoscopy: Until age 42, we recommend routine surveillance.   - Dental: no issues  - Mental health: no overt issues  Immunization History   Administered Date(s) Administered   ??? INFLUENZA TIV (TRI) 77MO+ W/ PRESERV (IM) 01/07/2014   ??? Influenza Vaccine Quad (IIV4 PF) 18mo+ injectable 12/23/2015, 01/12/2017, 12/28/2017   ??? Influenza Virus Vaccine, unspecified formulation 01/07/2014, 12/28/2017   ??? PNEUMOCOCCAL POLYSACCHARIDE 23 01/20/2011, 03/01/2017   ??? TdaP 01/12/2009, 05/17/2012   ??? ZOSTAVAX - ZOSTER VACCINE, LIVE, SQ 05/17/2012       Defers all vaccines, may consider ShingRx.       Reeducated patient on the importance of preventing illness, infection, and opportunistic cancers and the rationale behind vaccines and routine imaging, given his status as an immunocompromised person.     Return to clinic: 1 year  Labs: monthly      Subjective:     Jeffrey Ward is a 71 y.o. male with history of HCV cirrhosis and HCC s/p OLT 09/15/2016 c/b multiple medical problems post-operatively, including hepatic lesions inevitably determined to be abscesses s/p antibiotic therapy and broken hip s/p surgical repair. He additionally, given his multiple intubations, presumed traumatic, he suffered a right vocal fold paralysis and underwent 03/2018 endoscopic CO2 laser and injection for breathy voice and glottal insufficiency with minimal improvement to symptoms. We additionally referred patient to nephrology for CKD III management.    Denies fever, chills, weight loss/gain, and fatigue. Denies chest pain, SOB, N/V/D, constipation or abdominal pain. No acute complaint today. His wife passed in December 2022, after a lengthy illness, he was her primary caretaker. Feels sad, but okay.        Past Medical History:   Diagnosis Date   ??? Atrial fibrillation (CMS-HCC)    ??? Basal cell carcinoma    ??? Cancer (CMS-HCC)    ??? Cirrhosis (CMS-HCC)    ??? Depression    ??? Diabetes (CMS-HCC)    ??? Headache    ??? Hepatitis C 07/17/2012   ??? History of transfusion    ??? Liver disease    ??? Low back pain 07/17/2012   ??? Varices, esophageal (CMS-HCC)        Past Surgical History:   Procedure Laterality Date   ??? ANKLE SURGERY     ??? BACK SURGERY     ??? BACK SURGERY     ??? CARDIAC SURGERY      pacemaker   ??? CHG US GUIDE, TISSUE ABLATION N/A 01/22/2016    Procedure: ULTRASOUND GUIDANCE FOR, AND MONITORING OF, PARENCHYMAL TISSUE ABLATION;  Surgeon: Particia Nearing, MD;  Location: MAIN OR [facility];  Service: Transplant   ??? IR EMBOLIZATION ORGAN ISCHEMIA, TUMORS,  INFAR  06/16/2016    IR EMBOLIZATION ORGAN ISCHEMIA, TUMORS, INFAR 06/16/2016 Ammie Dalton, MD IMG VIR H&V [facility]   ??? PR COLON CA SCRN NOT HI RSK IND N/A 02/27/2015    Procedure: COLOREC CNCR SCR;COLNSCPY NO;  Surgeon: Vonda Antigua, MD;  Location: GI PROC[facility]URES [facility];  Service: Gastroenterology   ??? PR ENDOSCOPIC ULTRASOUND EXAM N/A 02/27/2015    Procedure: UGI ENDO; W/ENDO ULTRASOUND EXAM INCLUDES ESOPHAGUS, STOMACH, &/OR DUODENUM/JEJUNUM;  Surgeon: Vonda Antigua, MD;  Location: [facility]URES [facility];  Service: Gastroenterology   ??? PR ERCP BALLOON DILATE BILIARY/PANC DUCT/AMPULLA EA N/A 02/10/2017    Procedure: ERCP;WITH TRANS-ENDOSCOPIC BALLOON DILATION OF BILIARY/PANCREATIC DUCT(S) OR OF AMPULLA, INCLUDING SPHINCTERECTOMY, WHEN PERFOREMD,EACH DUCT (16109);  Surgeon: Mayford Knife, MD;  Location: GI PROC[facility]URES [facility];  Service: Gastroenterology   ??? PR ERCP REMOVE FOREIGN BODY/STENT BILIARY/PANC DUCT N/A 02/10/2017    Procedure: ENDOSCOPIC RETROGRADE CHOLANGIOPANCREATOGRAPHY (ERCP); W/ REMOVAL OF FOREIGN BODY/STENT FROM BILIARY/PANCREATIC DUCT(S);  Surgeon: Mayford Knife, MD;  Location: GI PROCEDURES [facility]ORIAL [facility];  Service: Gastroenterology   ??? PR ERCP REMOVE FOREIGN BODY/STENT BILIARY/PANC DUCT N/A 06/29/2017    Procedure: ENDOSCOPIC RETROGRADE CHOLANGIOPANCREATOGRAPHY (ERCP); W/ REMOVAL OF FOREIGN BODY/STENT FROM BILIARY/PANCREATIC DUCT(S);  Surgeon: Vonda Antigua, MD;  Location: GI PROC[facility]URES [facility];  Service: Gastroenterology   ??? PR ERCP STENT PLACEMENT BILIARY/PANCREATIC DUCT N/A 11/29/2016    Procedure: ENDOSCOPIC RETROGRADE CHOLANGIOPANCREATOGRAPHY (ERCP); WITH PLACEMENT OF ENDOSCOPIC STENT INTO BILIARY OR PANCREATIC DUCT;  Surgeon: Chriss Driver, MD;  Location: GI PROCEDURES [facility];  Service: Gastroenterology   ??? PR ERCP STENT PLACEMENT BILIARY/PANCREATIC DUCT N/A 04/26/2017    Procedure: ENDOSCOPIC RETROGRADE CHOLANGIOPANCREATOGRAPHY (ERCP); WITH PLACEMENT OF ENDOSCOPIC STENT INTO BILIARY OR PANCREATIC DUCT;  Surgeon: Mayford Knife, MD;  Location: GI PROCEDURES [facility];  Service: Gastroenterology   ??? PR ERCP,W/REMOVAL STONE,BIL/PANCR DUCTS N/A 11/29/2016    Procedure: ERCP; W/ENDOSCOPIC RETROGRADE REMOVAL OF CALCULUS/CALCULI FROM BILIARY &/OR PANCREATIC DUCTS;  Surgeon: Chriss Driver, MD;  Location: [facility]URES [facility];  Service: Gastroenterology   ??? PR ERCP,W/REMOVAL STONE,BIL/PANCR DUCTS N/A 02/10/2017    Procedure: ERCP; W/ENDOSCOPIC RETROGRADE REMOVAL OF CALCULUS/CALCULI FROM BILIARY &/OR PANCREATIC DUCTS;  Surgeon: Mayford Knife, MD;  Location: GI PROCEDURES [facility];  Service: Gastroenterology   ??? PR ERCP,W/REMOVAL STONE,BIL/PANCR DUCTS N/A 04/26/2017    Procedure: ERCP; W/ENDOSCOPIC RETROGRADE REMOVAL OF CALCULUS/CALCULI FROM BILIARY &/OR PANCREATIC DUCTS;  Surgeon: Mayford Knife, MD;  Location: GI PROCEDURES MEMOR[facility];  Service: Gastroenterology   ??? PR ERCP,W/REMOVAL STONE,BIL/PANCR DUCTS N/A 06/29/2017    Procedure: ERCP; W/ENDOSCOPIC RETROGRADE REMOVAL OF CALCULUS/CALCULI FROM BILIARY &/OR PANCREATIC DUCTS;  Surgeon: Vonda Antigua, MD;  Location: GI PROCEDURES [facility];  Service: Gastroenterology   ??? PR ESOPHAGOSCP RIG TRANSORAL HYPOPHARYNX CRV ESOPH N/A 04/04/2018    Procedure: ESOPHAGOSCOPY, RIGID, TRANSORAL WITH DIVERTICULECTOMY OF HYPOPHARYNX OR CERVICAL ESOPHAGUS, WITH CRICOPHARYNGEAL MYOTOMY, INCLUDES USE OF TELESCOPE OR OPERATING MICROSCOPE AND REPAIR, WHEN PERFORMED;  Surgeon: Vista Deck, MD;  Location: MAIN OR [facility];  Service: ENT   ??? PR INSER HEART TEMP PACER ONE CHMBR N/A 10/02/2016    Procedure: Tempoarary Pacemaker Insertion;  Surgeon: Meredith Leeds, MD;  Location: [facility];  Service: Cardiology   ??? PR LAP,DIAGNOSTIC ABDOMEN N/A 01/22/2016    Procedure: Laparoscopy, Abdomen, Peritoneum, & Omentum, Diagnostic, W/Wo Collection Specimen(S) By Brushing Or Washing;  Surgeon: Particia Nearing, MD;  Location: MAIN OR [facility];  Service: Transplant   ??? PR  LARYNGOPLASTY MEDIALIZATION UNLIATERAL N/A 11/07/2018    Procedure: R31 LARYNGOPLASTY, MEDIALIZATION, UNILATERAL;  Surgeon: Vista Deck, MD;  Location: MAIN OR [facility];  Service: ENT   ??? PR PLACE PERCUT GASTROSTOMY TUBE N/A 11/17/2016    Procedure: UGI ENDO; W/DIRECTED PLCMT PERQ GASTROSTOMY TUBE;  Surgeon: Cletis Athens, MD;  Location: GI PROC[facility]URES [facility];  Service: Gastroenterology   ??? PR TRACHEOSTOMY, PLANNED N/A 09/29/2016    Procedure: TRACHEOSTOMY PLANNED (SEPART PROC);  Surgeon: Katherina Mires, MD;  Location: MAIN OR [facility];  Service: Trauma   ??? PR TRANSCATH INSERT OR REPLACE LEADLESS PM VENTR N/A 10/11/2016    Procedure: Pacemaker Implant/Replace Leadless;  Surgeon: Meredith Leeds, MD;  Location: [facility];  Service: Cardiology   ??? PR TRANSPLANT LIVER,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    Procedure: Liver Allotransplantation; Orthotopic, Partial Or Whole, From Cadaver Or Living Donor, Any Age;  Surgeon: Doyce Loose, MD;  Location: MAIN OR [facility];  Service: Transplant   ??? PR TRANSPLANT,PREP DONOR LIVER, WHOLE N/A 09/15/2016    Procedure: Rogelia Boga Std Prep Cad Donor Whole Liver Gft Prior Tnsplnt,Inc Chole,Diss/Rem Surr Tissu Wo Triseg/Lobe Splt;  Surgeon: Doyce Loose, MD;  Location: MAIN OR [facility];  Service: Transplant   ??? PR UPPER GI ENDOSCOPY,BIOPSY N/A 07/17/2012    Procedure: UGI ENDOSCOPY; WITH BIOPSY, SINGLE OR MULTIPLE;  Surgeon: Alba Destine, MD;  Location: [facility] PROC[facility]URES [facility];  Service: Gastroenterology   ??? PR UPPER GI ENDOSCOPY,DIAGNOSIS N/A 02/04/2014    Procedure: UGI ENDO, INCLUDE ESOPHAGUS, STOMACH, & DUODENUM &/OR JEJUNUM; DX W/WO COLLECTION SPECIMN, BY BRUSH OR WASH;  Surgeon: Wilburt Finlay, MD;  Location: GI PROCEDURES [facility];  Service: Gastroenterology   ??? PR UPPER GI ENDOSCOPY,LIGAT VARIX N/A 11/05/2013    Procedure: UGI ENDO; W/BAND LIG ESOPH &/OR GASTRIC VARICES;  Surgeon: Wilburt Finlay, MD;  Location: [facility] PROC[facility]URES [facility];  Service: Gastroenterology       Family History   Problem Relation Age of Onset   ??? Hypertension Mother    ??? Cancer Mother    ??? Heart disease Mother    ??? Heart disease Father    ??? Cirrhosis Neg Hx    ??? Liver cancer Neg Hx    ??? Anemia Neg Hx    ??? Diabetes Neg Hx    ??? Kidney disease Neg Hx    ??? Obesity Neg Hx    ??? Thyroid disease Neg Hx    ??? Osteoporosis Neg Hx    ??? Coronary artery disease Neg Hx    ??? Anesthesia problems Neg Hx    ??? Basal cell carcinoma Neg Hx    ??? Squamous cell carcinoma Neg Hx        Social History     Socioeconomic History   ??? Marital status: Widowed   Tobacco Use ??? Smoking status: Never   ??? Smokeless tobacco: Never   ??? Tobacco comments:     Smoked in high school for about 5 years.    Substance and Sexual Activity   ??? Alcohol use: No   ??? Drug use: No   ??? Sexual activity: Yes     Partners: Female     Birth control/protection: None   Other Topics Concern   ??? Exercise No   ??? Living Situation No       REVIEW OF SYSTEMS:   The balance of 10/12 systems is negative with the exception of HPI.    Objective:     MEDICATIONS:  No  Known Allergies    Current Outpatient Medications   Medication Sig Dispense Refill   ??? amiodarone (PACERONE) 200 MG tablet Take by mouth.     ??? BASAGLAR KWIKPEN U-100 INSULIN 100 unit/mL (3 mL) injection pen Inject 14 Units subcutaneously at bedtime 15 mL 4   ??? bisacodyL (DULCOLAX) 5 mg EC tablet Take 5 mg by mouth daily as needed.      ??? blood-glucose meter (GLUCOSE MONITORING KIT) kit Use as instructed 1 each 0   ??? cholecalciferol, vitamin D3, 125 mcg (5,000 unit) tablet Take 125 mcg by mouth daily.     ??? cycloSPORINE modified (NEORAL) 100 MG capsule Take 2 capsules (200 mg total) by mouth daily AND 1 capsule (100 mg total) nightly. 270 capsule 3   ??? cycloSPORINE modified (NEORAL) 25 MG capsule Take 3 capsules (75 mg total) by mouth nightly (with a 100mg  capsule). For a total dose of 200 mg in the morning , 175mg  every evening 270 capsule 3   ??? DULoxetine (CYMBALTA) 30 MG capsule Take 2 capsules (60 mg total) by mouth nightly. 60 capsule 11   ??? magnesium oxide-Mg AA chelate (MAGNESIUM, AMINO ACID CHELATE,) 133 mg Take 2 tablets by mouth Two (2) times a day. 120 tablet PRN   ??? melatonin 3 mg Tab Take 1 tablet (3 mg total) by mouth nightly. 30 tablet 0   ??? metoprolol succinate (TOPROL-XL) 25 MG 24 hr tablet Take 25 mg by mouth.     ??? mycophenolate (CELLCEPT) 250 mg capsule Take 2 capsules (500 mg total) by mouth two (2) times a day. 120 capsule 11   ??? pantoprazole (PROTONIX) 40 MG tablet Take 1 tablet by mouth daily.     ??? pen needle, diabetic (PEN NEEDLE) 32 gauge x 5/32 (4 mm) Ndle Use as directed four times daily 100 each 1   ??? pregabalin (LYRICA) 100 MG capsule Take 1 capsule (100 mg total) by mouth 2 (two) times daily 60 capsule 11   ??? pregabalin (LYRICA) 75 MG capsule Take 1 capsule (75 mg total) by mouth Two (2) times a day. 60 capsule 6   ??? rosuvastatin (CRESTOR) 5 MG tablet Take 1 tablet (5 mg total) by mouth daily. 90 tablet 1   ??? sildenafiL, pulm.hypertension, (REVATIO) 20 mg tablet Take 2 tablets (40 mg total) by mouth Three (3) times a day. 180 tablet 6   ??? tamsulosin (FLOMAX) 0.4 mg capsule Take 1 capsule (0.4 mg total) by mouth daily. 90 capsule 0   ??? mirtazapine (REMERON) 15 MG tablet Take 1 tablet (15 mg total) by mouth nightly. 90 tablet 3   ??? pen needle, diabetic (ULTICARE PEN NEEDLE) 32 gauge x 5/32 (4 mm) Ndle USE WITH INSULIN PEN 4 TIMES DAILY AS DIRECTED 100 each 1   ??? predniSONE (DELTASONE) 5 MG tablet Take 4 tablets (20 mg) by mouth for one day, then take 3 tablets (15 mg) the next day, then take 2 tablets (10 mg) the next day, then take 1 tablet (5mg ) then stop (Patient not taking: Reported on 02/10/2021) 10 tablet 0     No current facility-administered medications for this visit.         PHYSICAL EXAM:  BP 120/64 (BP Site: L Arm, BP Position: Sitting, BP Cuff Size: Medium)  - Pulse 65  - Temp 37.3 ??C (99.1 ??F) (Temporal)  - Wt 69.6 kg (153 lb 6.4 oz)  - SpO2 98%  - BMI 22.65 kg/m??     ??  Physical Exam:  General Appearance:   No acute distress  Lungs:                          Clear to auscultation bilaterally  Heart:                           Regular rate and rhythm  Abdomen:                     Soft, non-tender, non-distended, well healed scar  Extremities:                  Warm and well perfused    .    LAB RESULTS:  Lab Results   Component Value Date    WBC 4.8 05/17/2021    HGB 12.1 (L) 05/17/2021    HCT 35.4 (L) 05/17/2021    PLT 117 (L) 05/17/2021       Lab Results   Component Value Date    NA 139 05/17/2021    K 4.8 05/17/2021    CL 108 (H) 05/17/2021    CO2 22.0 05/17/2021    BUN 37 (H) 05/17/2021    CREATININE 1.93 (H) 05/17/2021    GLU 244 (H) 05/17/2021    CALCIUM 9.8 05/17/2021    MG 1.6 05/17/2021    PHOS 3.8 05/17/2021       Lab Results   Component Value Date    BILITOT 0.6 05/17/2021    BILIDIR 0.20 05/17/2021    PROT 6.5 05/17/2021    ALBUMIN 3.6 05/17/2021    ALT 11 05/17/2021    AST 12 05/17/2021    ALKPHOS 75 05/17/2021    GGT 27 05/17/2021       Lab Results   Component Value Date    PT 11.6 11/05/2018    INR 1.01 11/05/2018    APTT 30.5 11/05/2018       IMAGING:

## 2021-05-22 LAB — VITAMIN D 25 HYDROXY: VITAMIN D, TOTAL (25OH): 35.6 ng/mL (ref 20.0–80.0)

## 2021-05-24 MED ORDER — DULOXETINE 30 MG CAPSULE,DELAYED RELEASE
ORAL_CAPSULE | Freq: Every evening | ORAL | 11 refills | 30 days
Start: 2021-05-24 — End: 2022-05-24

## 2021-05-24 NOTE — Unmapped (Signed)
[facility] Refill Coordination Note    Specialty Medication(s) to be Shipped:   Transplant: mycophenolate mofetil 250mg , cyclosporine 25mg  and cyclosporine 100mg     Other medication(s) to be shipped: duloxetine, basaglar, magnesium, pregabalin, silenafil and rosuvasatin     Jeffrey Ward, DOB: 1950/07/18  Phone: [phone] (home)       All above HIPAA information was verified with patient.     Was a Nurse, learning disability used for this call? No    Completed refill call assessment today to schedule patient's medication shipment from the [facility] [phone]).  All relevant notes have been reviewed.     Specialty medication(s) and dose(s) confirmed: Regimen is correct and unchanged.   Changes to medications: [name] reports no changes at this time.  Changes to insurance: No  New side effects reported not previously addressed with a pharmacist or physician: None reported  Questions for the pharmacist: No    Confirmed patient received a Conservation officer, historic buildings and a Surveyor, mining with first shipment. The patient will receive a drug information handout for each medication shipped and additional FDA Medication Guides as required.       DISEASE/MEDICATION-SPECIFIC INFORMATION        N/A    SPECIALTY MEDICATION ADHERENCE     Medication Adherence    Patient reported X missed doses in the last month: 0  Specialty Medication: Cyclosporine 25mg   Patient is on additional specialty medications: Yes  Additional Specialty Medications: Cyclosporine 100mg   Patient Reported Additional Medication X Missed Doses in the Last Month: 0  Patient is on more than two specialty medications: Yes  Specialty Medication: Mycophenolate  250mg   Patient Reported Additional Medication X Missed Doses in the Last Month: 0  Support network for adherence: family member        Were doses missed due to medication being on hold? No    Cyclosporine 25 mg: 7 days of medicine on hand   Cyclosporine 100 mg: 7 days of medicine on hand   Mycophenolate 250 mg: 7 days of medicine on hand     REFERRAL TO PHARMACIST     Referral to the pharmacist: Not needed      [facility]     Shipping address confirmed in Epic.     Delivery Scheduled: Yes, Expected medication delivery date: 05/28/2021.     Medication will be delivered via Same Day Courier to the prescription address in Epic WAM.    [name]   [facility] Specialty Technician

## 2021-05-25 MED ORDER — DULOXETINE 30 MG CAPSULE,DELAYED RELEASE
ORAL_CAPSULE | Freq: Every evening | ORAL | 5 refills | 30 days | Status: CP
Start: 2021-05-25 — End: 2021-11-21
  Filled 2021-05-28: qty 60, 30d supply, fill #0

## 2021-05-25 NOTE — Unmapped (Signed)
Last Visit Date: 05/22/2020  Next Visit Date: Visit date not found    No results found for: CBC, CMP     Results for orders placed or performed in visit on 11/30/20   ECG 12 Lead   Result Value Ref Range    EKG Systolic BP  mmHg    EKG Diastolic BP  mmHg    EKG Ventricular Rate 58 BPM    EKG Atrial Rate 58 BPM    EKG P-R Interval 146 ms    EKG QRS Duration 92 ms    EKG Q-T Interval 418 ms    EKG QTC Calculation 410 ms    EKG Calculated P Axis 7 degrees    EKG Calculated R Axis 50 degrees    EKG Calculated T Axis 77 degrees    QTC Fredericia 413 ms

## 2021-05-28 MED FILL — MG-PLUS-PROTEIN 133 MG TABLET: ORAL | 30 days supply | Qty: 120 | Fill #2

## 2021-05-28 MED FILL — MYCOPHENOLATE MOFETIL 250 MG CAPSULE: ORAL | 30 days supply | Qty: 120 | Fill #4

## 2021-05-28 MED FILL — CYCLOSPORINE MODIFIED 100 MG CAPSULE: ORAL | 30 days supply | Qty: 90 | Fill #1

## 2021-05-28 MED FILL — ROSUVASTATIN 5 MG TABLET: ORAL | 90 days supply | Qty: 90 | Fill #1

## 2021-05-28 MED FILL — CYCLOSPORINE MODIFIED 25 MG CAPSULE: ORAL | 30 days supply | Qty: 90 | Fill #1

## 2021-05-28 MED FILL — PREGABALIN 100 MG CAPSULE: 30 days supply | Qty: 60 | Fill #8

## 2021-05-28 MED FILL — SILDENAFIL (PULMONARY HYPERTENSION) 20 MG TABLET: ORAL | 30 days supply | Qty: 180 | Fill #4

## 2021-06-04 ENCOUNTER — Emergency Department
Admit: 2021-06-04 | Discharge: 2021-06-04 | Disposition: A | Discharge status: Home / Self Care | Payer: MEDICARE | Attending: Student in an Organized Health Care Education/Training Program

## 2021-06-04 ENCOUNTER — Ambulatory Visit
Admit: 2021-06-04 | Discharge: 2021-06-04 | Disposition: A | Discharge status: Home / Self Care | Payer: MEDICARE | Attending: Student in an Organized Health Care Education/Training Program

## 2021-06-04 DIAGNOSIS — M25531 Pain in right wrist: Principal | ICD-10-CM

## 2021-06-04 NOTE — Unmapped (Signed)
[facility]  Emergency Department Provider Note      ED Clinical Impression      Final diagnoses:   None            Impression, Medical Decision Making, Progress Notes and Critical Care      Impression, Differential Diagnosis and Plan of Care    ***        Independent Interpretation of Studies    I have independently interpreted the following studies:  ??? EKG: {** time/tracing time/my interpretation **}  ??? X-ray(s): {** time/study/my interpretation **}  ??? CT/MRI(s): {** time/study/my interpretation **}  ??? Ultrasound(s): {** time/study/my interpretation **}    Discussion of Management with other Providers or Support Staff    I discussed the management of this patient with the:  ??? Admitting provider: {** time/admitting service AND name of provider/planned inpatient workup & management **}  ??? Consultant(s): {** time/consulting service AND name of provider/summary of discussion **}  ??? Radiologist: {** time/name of radiologist/study discussed/summary of discussion **}  ??? ED Pharmacist: {** time/name of pharmacist/summary of discussion **}  ??? Case Management/Social Work: {** time/reason for consult/summary of discussion **}  ??? Other: {** time/who you spoke with/summary of discussion **}        Portions of this record have been created using Scientist, clinical (histocompatibility and immunogenetics). Dictation errors have been sought, but may not have been identified and corrected.    See chart and resident provider documentation for details.    ____________________________________________         History        Reason for Visit  Wrist Pain      Chief Complaint  {chief complaint:28504:::1}    HPI   Jeffrey Ward is a 71 y.o. male ***       External Records Reviewed  (Inpatient/Outpatient notes, Prior labs/imaging studies, Care Everywhere, PDMP, External ED notes, etc)    ***    Past Medical History:   Diagnosis Date   ??? Atrial fibrillation (CMS-HCC)    ??? Basal cell carcinoma    ??? Cancer (CMS-HCC)    ??? Cirrhosis (CMS-HCC)    ??? Interpretation of Studies    I have independently interpreted the following studies:  X-ray(s): X-ray forearm, right wrist/no concerning osseous abnormalities consistent with fracture.      Discussion of Management with other Providers or Support Staff    I discussed the management of this patient with the:  Other: Discussed this patient with ED attending Dr. Clinton Sawyer as to patient's HPI, exam findings, imaging findings, treatment plan and plan for discharge with follow-up with Ortho for evaluation of possible scaphoid fracture.        Portions of this record have been created using Scientist, clinical (histocompatibility and immunogenetics). Dictation errors have been sought, but may not have been identified and corrected.    See chart and resident provider documentation for details.    ____________________________________________         History        Reason for Visit  Wrist Pain      HPI   Jeffrey Ward is a 71 y.o. male with fairly significant past medical history as listed below that presented for a mechanical fall as reported by the patient and his wife who was seated beside him and minor treatment.  Patient was ambulating yesterday and tripped on something at home causing him to fall and catch himself with his right hand causing immediate pain and swelling to the affected extremity.  Patient  has had continued pain and extreme difficulty moving his hand and wrist today leading to his presentation to the emergency department.  Patient states he is able to feel all of his fingers and move them however he cannot make a complete fist as it is causing severe pain.  Patient denies any other injuries and wife corroborated his story with no reported preceding concerning signs or symptoms to include shortness of breath, dizziness, syncope, chest pain, nausea/vomiting, episodes of diaphoresis, or onset of acute weakness.      External Records Reviewed  (Inpatient/Outpatient notes, Prior labs/imaging studies, Care Everywhere, PDMP, External ED notes, ??? IR EMBOLIZATION ORGAN ISCHEMIA, TUMORS, INFAR  06/16/2016    IR EMBOLIZATION ORGAN ISCHEMIA, TUMORS, INFAR 06/16/2016 Ammie Dalton, MD IMG VIR [facility]   ??? PR COLON CA SCRN NOT HI RSK IND N/A 02/27/2015    Procedure: COLOREC CNCR SCR;COLNSCPY NO;  Surgeon: Vonda Antigua, MD;  Location: [facility];  Service: Gastroenterology   ??? PR ENDOSCOPIC ULTRASOUND EXAM N/A 02/27/2015    Procedure: UGI ENDO; W/ENDO ULTRASOUND EXAM INCLUDES ESOPHAGUS, STOMACH, &/OR DUODENUM/JEJUNUM;  Surgeon: Vonda Antigua, MD;  Location: [facility];  Service: Gastroenterology   ??? PR ERCP BALLOON DILATE BILIARY/PANC DUCT/AMPULLA EA N/A 02/10/2017    Procedure: ERCP;WITH TRANS-ENDOSCOPIC BALLOON DILATION OF BILIARY/PANCREATIC DUCT(S) OR OF AMPULLA, INCLUDING SPHINCTERECTOMY, WHEN PERFOREMD,EACH DUCT (16109);  Surgeon: Mayford Knife, MD;  Location: [facility];  Service: Gastroenterology   ??? PR ERCP REMOVE FOREIGN BODY/STENT BILIARY/PANC DUCT N/A 02/10/2017    Procedure: ENDOSCOPIC RETROGRADE CHOLANGIOPANCREATOGRAPHY (ERCP); W/ REMOVAL OF FOREIGN BODY/STENT FROM BILIARY/PANCREATIC DUCT(S);  Surgeon: Mayford Knife, MD;  Location: [facility];  Service: Gastroenterology   ??? PR ERCP REMOVE FOREIGN BODY/STENT BILIARY/PANC DUCT N/A 06/29/2017    Procedure: ENDOSCOPIC RETROGRADE CHOLANGIOPANCREATOGRAPHY (ERCP); W/ REMOVAL OF FOREIGN BODY/STENT FROM BILIARY/PANCREATIC DUCT(S);  Surgeon: Vonda Antigua, MD;  Location: [facility]URES [facility];  Service: Gastroenterology   ??? PR ERCP STENT PLACEMENT BILIARY/PANCREATIC DUCT N/A 11/29/2016    Procedure: ENDOSCOPIC RETROGRADE CHOLANGIOPANCREATOGRAPHY (ERCP); WITH PLACEMENT OF ENDOSCOPIC STENT INTO BILIARY OR PANCREATIC DUCT;  Surgeon: Chriss Driver, MD;  Location: [facility];  Service: Gastroenterology   ??? PR ERCP STENT PLACEMENT BILIARY/PANCREATIC DUCT N/A 04/26/2017    Procedure: ENDOSCOPIC RETROGRADE CHOLANGIOPANCREATOGRAPHY (ERCP); WITH PLACEMENT OF ENDOSCOPIC STENT INTO BILIARY OR PANCREATIC DUCT;  Surgeon: Mayford Knife, MD;  Location: GI PROC[facility]URES [facility];  Service: Gastroenterology   ??? PR ERCP,W/REMOVAL STONE,BIL/PANCR DUCTS N/A 11/29/2016    Procedure: ERCP; W/ENDOSCOPIC RETROGRADE REMOVAL OF CALCULUS/CALCULI FROM BILIARY &/OR PANCREATIC DUCTS;  Surgeon: Chriss Driver, MD;  Location: [facility]URES [facility];  Service: Gastroenterology   ??? PR ERCP,W/REMOVAL STONE,BIL/PANCR DUCTS N/A 02/10/2017    Procedure: ERCP; W/ENDOSCOPIC RETROGRADE REMOVAL OF CALCULUS/CALCULI FROM BILIARY &/OR PANCREATIC DUCTS;  Surgeon: Mayford Knife, MD;  Location: [facility];  Service: Gastroenterology   ??? PR ERCP,W/REMOVAL STONE,BIL/PANCR DUCTS N/A 04/26/2017    Procedure: ERCP; W/ENDOSCOPIC RETROGRADE REMOVAL OF CALCULUS/CALCULI FROM BILIARY &/OR PANCREATIC DUCTS;  Surgeon: Mayford Knife, MD;  Location: [facility];  Service: Gastroenterology   ??? PR ERCP,W/REMOVAL STONE,BIL/PANCR DUCTS N/A 06/29/2017    Procedure: ERCP; W/ENDOSCOPIC RETROGRADE REMOVAL OF CALCULUS/CALCULI FROM BILIARY &/OR PANCREATIC DUCTS;  Surgeon: Vonda Antigua, MD;  Location: [facility];  Service: Gastroenterology   ??? PR ESOPHAGOSCP RIG TRANSORAL HYPOPHARYNX CRV ESOPH N/A 04/04/2018    Procedure: ESOPHAGOSCOPY,  RIGID, TRANSORAL WITH DIVERTICULECTOMY OF HYPOPHARYNX OR CERVICAL ESOPHAGUS, WITH CRICOPHARYNGEAL MYOTOMY, INCLUDES USE OF TELESCOPE OR OPERATING MICROSCOPE AND REPAIR, WHEN PERFORMED;  Surgeon: Vista Deck, MD;  Location: MAIN OR [facility];  Service: ENT   ??? PR INSER HEART TEMP PACER ONE CHMBR N/A 10/02/2016    Procedure: Tempoarary Pacemaker Insertion;  Surgeon: Meredith Leeds, MD;  Location: [facility];  Service: Cardiology   ??? PR LAP,DIAGNOSTIC ABDOMEN N/A 01/22/2016    Procedure: Laparoscopy, Abdomen, Peritoneum, & Omentum, Diagnostic, W/Wo Collection Specimen(S) By Brushing Or Washing;  Surgeon: Particia Nearing, MD;  Location: MAIN OR [facility];  Service: Transplant   ??? PR LARYNGOPLASTY MEDIALIZATION UNLIATERAL N/A 11/07/2018    Procedure: R31 LARYNGOPLASTY, MEDIALIZATION, UNILATERAL;  Surgeon: Vista Deck, MD;  Location: MAIN OR [facility];  Service: ENT   ??? PR PLACE PERCUT GASTROSTOMY TUBE N/A 11/17/2016    Procedure: UGI ENDO; W/DIRECTED PLCMT PERQ GASTROSTOMY TUBE;  Surgeon: Cletis Athens, MD;  Location: [facility];  Service: Gastroenterology   ??? PR TRACHEOSTOMY, PLANNED N/A 09/29/2016    Procedure: TRACHEOSTOMY PLANNED (SEPART PROC);  Surgeon: Katherina Mires, MD;  Location: MAIN OR [facility];  Service: Trauma   ??? PR TRANSCATH INSERT OR REPLACE LEADLESS PM VENTR N/A 10/11/2016    Procedure: Pacemaker Implant/Replace Leadless;  Surgeon: Meredith Leeds, MD;  Location: [facility];  Service: Cardiology   ??? 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 PR TRANSPLANT,PREP DONOR LIVER, WHOLE N/A 09/15/2016    Procedure: Rogelia Boga Std Prep Cad Donor Whole Liver Gft Prior Tnsplnt,Inc Chole,Diss/Rem Surr Tissu Wo Triseg/Lobe Splt;  Surgeon: Doyce Loose, MD;  Location: MAIN OR [facility];  Service: Transplant   ??? PR UPPER GI ENDOSCOPY,BIOPSY N/A 07/17/2012    Procedure: UGI ENDOSCOPY; WITH BIOPSY, SINGLE OR MULTIPLE;  Surgeon: Alba Destine, MD;  Location: [facility];  Service: Gastroenterology   ??? PR UPPER GI ENDOSCOPY,DIAGNOSIS N/A 02/04/2014    Procedure: UGI ENDO, INCLUDE ESOPHAGUS, STOMACH, & DUODENUM &/OR JEJUNUM; DX W/WO COLLECTION SPECIMN, BY BRUSH OR WASH;  Surgeon: Wilburt Finlay, MD;  Location: [facility]URES [facility];  Service: Gastroenterology   ??? PR UPPER GI ENDOSCOPY,LIGAT VARIX N/A 11/05/2013    Procedure: UGI ENDO; W/BAND LIG ESOPH &/OR GASTRIC VARICES;  Surgeon: Wilburt Finlay, MD;  Location: [facility];  Service: Gastroenterology       No current facility-administered medications for this encounter.    Current Outpatient Medications:   ???  amiodarone (PACERONE) 200 MG tablet, Take by mouth., Disp: , Rfl:   ???  BASAGLAR KWIKPEN U-100 INSULIN 100 unit/mL (3 mL) injection pen, Inject 14 Units subcutaneously at bedtime, Disp: 15 mL, Rfl: 4  ???  bisacodyL (DULCOLAX) 5 mg EC tablet, Take 5 mg by mouth daily as needed. , Disp: , Rfl:   ???  blood-glucose meter (GLUCOSE MONITORING KIT) kit, Use as instructed, Disp: 1 each, Rfl: 0  ???  cholecalciferol, vitamin D3, 125 mcg (5,000 unit) tablet, Take 125 mcg by mouth daily., Disp: , Rfl:   ???  cycloSPORINE modified (NEORAL) 100 MG capsule, Take 2 capsules (200 mg total) by mouth daily AND 1 capsule (100 mg total) nightly., Disp: 270 capsule, Rfl: 3  ???  cycloSPORINE modified (NEORAL) 25 MG capsule, Take 3 capsules (75  mg total) by mouth nightly (with a 100mg  capsule). For a total dose of 200 mg in the morning , 175mg  every evening, Disp: 270 capsule, Rfl: 3  ???  DULoxetine (CYMBALTA) 30 MG capsule, Take 2 capsules (60 mg total) by mouth nightly., Disp: 60 capsule, Rfl: 5  ???  magnesium oxide-Mg AA chelate (MAGNESIUM, AMINO ACID CHELATE,) 133 mg, Take 2 tablets by mouth Two (2) times a day., Disp: 120 tablet, Rfl: PRN  ???  melatonin 3 mg Tab, Take 1 tablet (3 mg total) by mouth nightly., Disp: 30 tablet, Rfl: 0  ???  metoprolol succinate (TOPROL-XL) 25 MG 24 hr tablet, Take 25 mg by mouth., Disp: , Rfl:   ???  mirtazapine (REMERON) 15 MG tablet, Take 1 tablet (15 mg total) by mouth nightly., Disp: 90 tablet, Rfl: 3  ???  mycophenolate (CELLCEPT) 250 mg capsule, Take 2 capsules (500 mg total) by mouth two (2) times a day., Disp: 120 capsule, Rfl: 11  ???  pantoprazole (PROTONIX) 40 MG tablet, Take 1 tablet by mouth daily., Disp: , Rfl:   ???  pen needle, diabetic (PEN NEEDLE) 32 gauge x 5/32 (4 mm) Ndle, Use as directed four times daily, Disp: 100 each, Rfl: 1  ???  pen needle, diabetic (ULTICARE PEN NEEDLE) 32 gauge x 5/32 (4 mm) Ndle, USE WITH INSULIN PEN 4 TIMES DAILY AS DIRECTED, Disp: 100 each, Rfl: 1  ???  predniSONE (DELTASONE) 5 MG tablet, Take 4 tablets (20 mg) by mouth for one day, then take 3 tablets (15 mg) the next day, then take 2 tablets (10 mg) the next day, then take 1 tablet (5mg ) then stop (Patient not taking: Reported on 02/10/2021), Disp: 10 tablet, Rfl: 0  ???  pregabalin (LYRICA) 100 MG capsule, Take 1 capsule (100 mg total) by mouth 2 (two) times daily, Disp: 60 capsule, Rfl: 11  ???  pregabalin (LYRICA) 75 MG capsule, Take 1 capsule (75 mg total) by mouth Two (2) times a day., Disp: 60 capsule, Rfl: 6  ???  rosuvastatin (CRESTOR) 5 MG tablet, Take 1 tablet (5 mg total) by mouth daily., Disp: 90 tablet, Rfl: 1  ???  sildenafiL, pulm.hypertension, (REVATIO) 20 mg tablet, Take 2 tablets (40 mg total) by mouth Three (3) times a day., Disp: 180 tablet, Rfl: 6  ???  tamsulosin (FLOMAX) 0.4 mg capsule, Take 1 capsule (0.4 mg total) by mouth daily., Disp: 90 capsule, Rfl: 0    Allergies  Patient has no known allergies.    Family History   Problem Relation Age of Onset   ??? Hypertension Mother    ??? Cancer Mother    ??? Heart disease Mother    ??? Heart disease Father    ??? Cirrhosis Neg Hx    ??? Liver cancer Neg Hx    ??? Anemia Neg Hx    ??? Diabetes Neg Hx    ??? Kidney disease Neg Hx    ??? Obesity Neg Hx    ??? Thyroid disease Neg Hx    ??? Osteoporosis Neg Hx    ??? Coronary artery disease Neg Hx    ??? Anesthesia problems Neg Hx    ??? Basal cell carcinoma Neg Hx    ??? Squamous cell carcinoma Neg Hx        Social History  Social History     Tobacco Use   ??? Smoking status: Never   ??? Smokeless tobacco: Never   ??? Tobacco comments:     Smoked in high  school for about 5 years.    Substance Use Topics   ??? Alcohol use: No   ??? Drug use: No          Physical Exam     This provider entered the patient's room: {NO ZOX:09604}:    ??? If this provider did not enter the room, a comprehensive physical exam was not able to be performed due to increased infection risk to themselves, other providers, staff and other patients), as well as to conserve personal protective equipment (PPE) utilization during the COVID-19 pandemic.    ??? If this provider did enter the patient room, the following was PPE worn: {Blank single:19197::Surgical mask, eye protection and gloves,Surgical mask, eye protection, gown and gloves,N95, eye protection and gloves,N95, eye protection, gown and gloves,N95, eye protection, cap, gown and gloves,CAPR with face shield, gown and gloves,N/A}     ED Triage Vitals [06/04/21 1523]   Enc Vitals Group      BP 150/69      Heart Rate 75      SpO2 Pulse       Resp 16      Temp 36.9 ??C (98.4 ??F)      Temp Source Oral      SpO2 98 %      Weight       Height       Head Circumference       Peak Flow       Pain Score       Pain Loc       Pain Edu?       Excl. in GC?        Constitutional: Alert and oriented. Well appearing and in no distress.  Eyes: Conjunctivae are normal.  ENT       Head: Normocephalic and atraumatic.       Nose: No congestion.       Mouth/Throat: Mucous membranes are moist.       Neck: No stridor.  Hematological/Lymphatic/Immunilogical: No cervical lymphadenopathy.  Cardiovascular: Normal rate, regular rhythm. Normal and symmetric distal pulses are present in all extremities.  Respiratory: Normal respiratory effort. Breath sounds are normal.  Gastrointestinal: Soft and nontender. There is no CVA tenderness.  Genitourinary: ***  Musculoskeletal: Normal range of motion in all extremities.       Right lower leg: No tenderness or edema.       Left lower leg: No tenderness or edema.  Neurologic: Normal speech and language. No gross focal neurologic deficits are appreciated.  Skin: Skin is warm, dry and intact. No rash noted.  Psychiatric: Mood and affect are normal. Speech and behavior are normal.       Radiology     XR Wrist 3 Or More Views Right   Final Result   Soft tissue swelling of the wrist without radiographic evidence of acute fracture or dislocation.           Procedures are normal. Speech and behavior are normal.       Radiology     XR Forearm Right   Final Result   No acute displaced fracture or traumatic malalignment.      XR Wrist 3 Or More Views Right   Final Result   Soft tissue swelling of the wrist without radiographic evidence of acute fracture or dislocation.           Procedures                  Matilde Bash  Maynard David, [place]  06/06/21 [phone]

## 2021-06-04 NOTE — Unmapped (Signed)
Presents with right wrist pain s/p mechanical fall last night. Swelling noted. Reports difficulty moving hand and wrist.

## 2021-06-07 ENCOUNTER — Ambulatory Visit: Admit: 2021-06-07 | Payer: MEDICARE

## 2021-06-09 IMAGING — RF DG ESOPHAGUS
8 of 11 series · 14 of 24 positions shown · non-contrast
Comparison: None.

CLINICAL DATA: Difficulty swallowing, pills getting stuck

EXAM:
ESOPHOGRAM/BARIUM SWALLOW
TECHNIQUE: Single contrast examination was performed using thin barium soluble.
The patient was observed with fluoroscopy swallowing a 13 mm barium
sulphate tablet.
FLUOROSCOPY TIME:  Fluoroscopy Time:  2 minutes 12 seconds
Radiation Exposure Index (if provided by the fluoroscopic device):
19.6 mGy

[Series 1: cp_standard · 0.18mm/px · 2 of 57 frames shown (1 of 6)]
[frame 1/57]
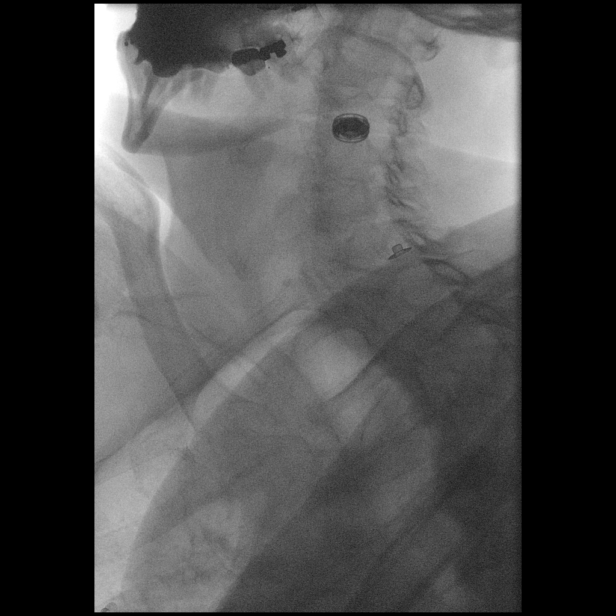
[frame 29/57]
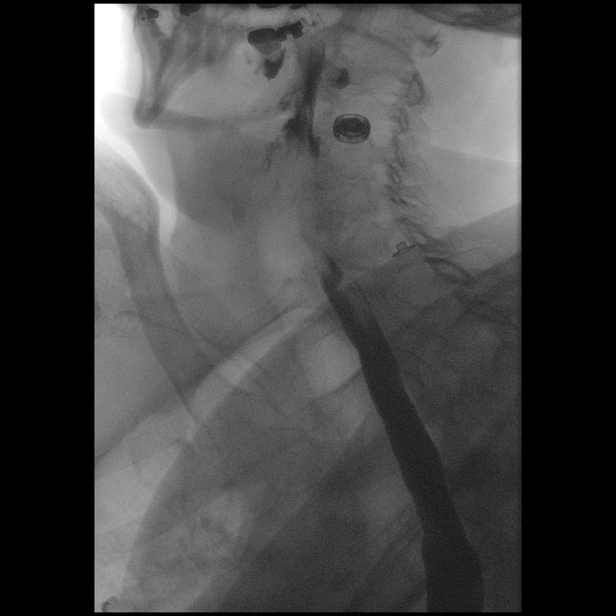

[Series 2: cp_standard · 0.19mm/px · 2 of 42 frames shown (2 of 6)]
[frame 7/42]
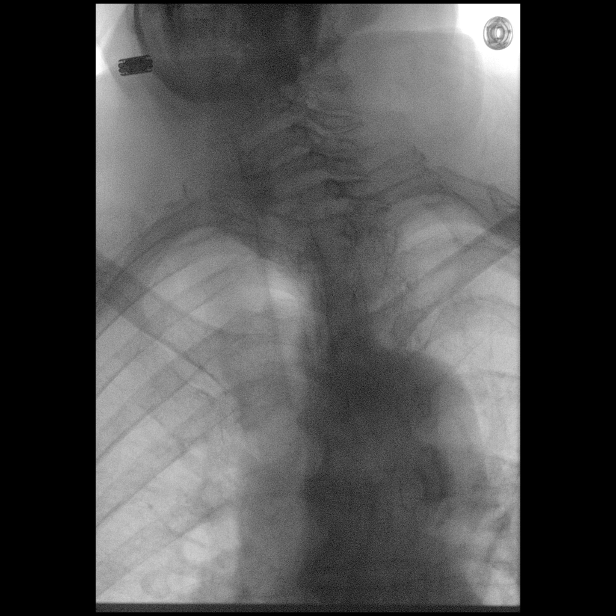
[frame 38/42]
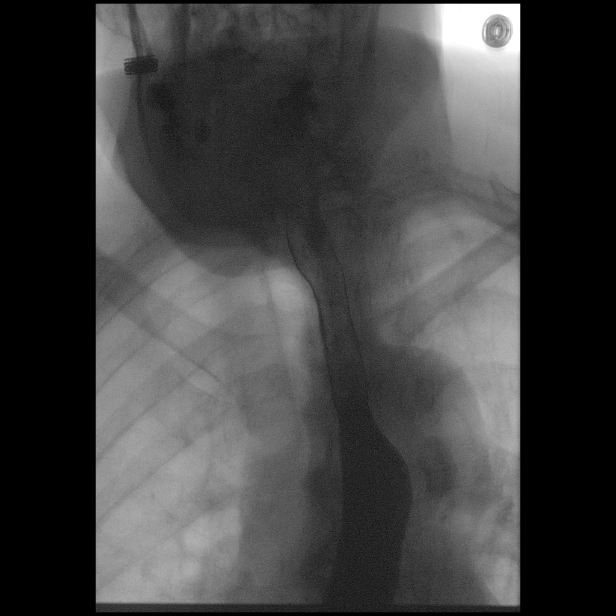

[Series 3: cp_standard · 0.28mm/px · 2 of 72 frames shown (3 of 6)]
[frame 11/72]
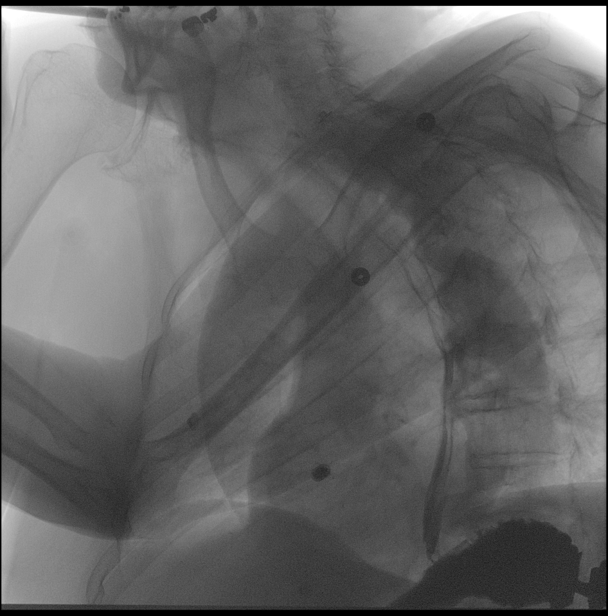
[frame 62/72]
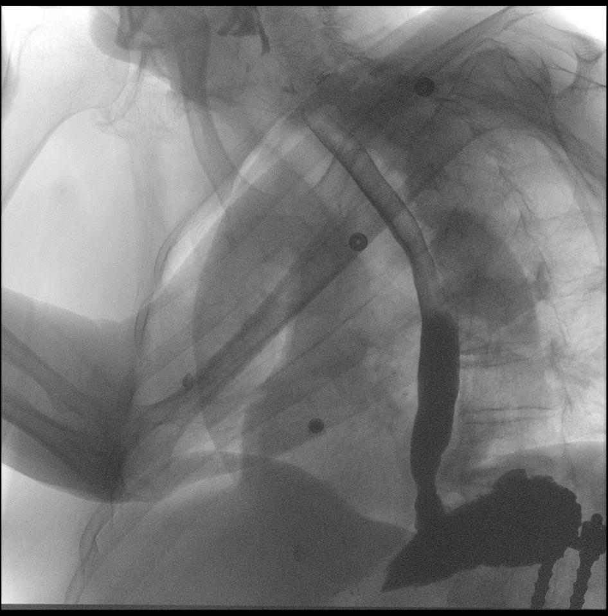

[Series 4: fluoro_barium 2fps_bw · 0.19mm/px · 2 of 2 frames shown (1 of 2)]
[frame 1/2]
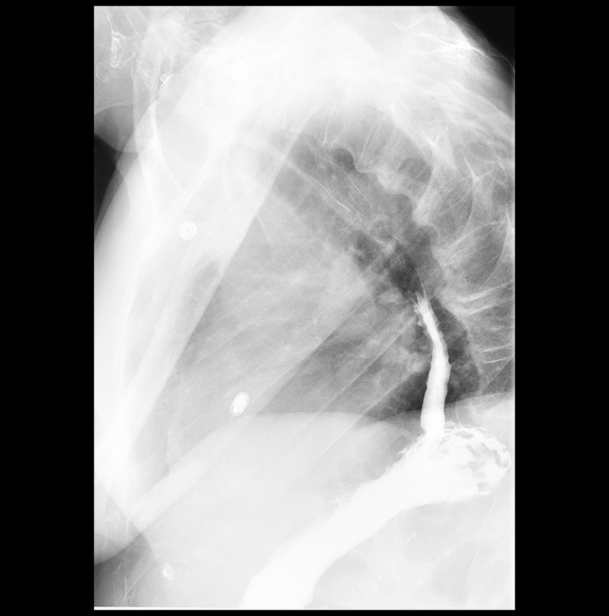
[frame 2/2]
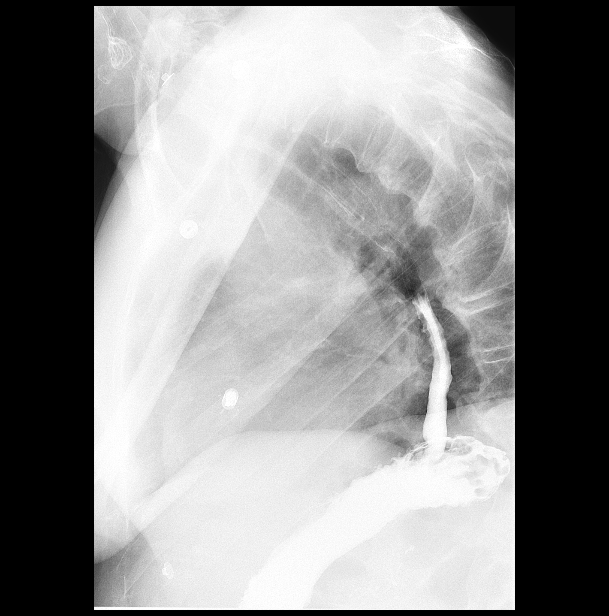

[Series 6: cp_standard · 0.28mm/px · 1 of 1 slices shown (4 of 6)]
[im 1/1]
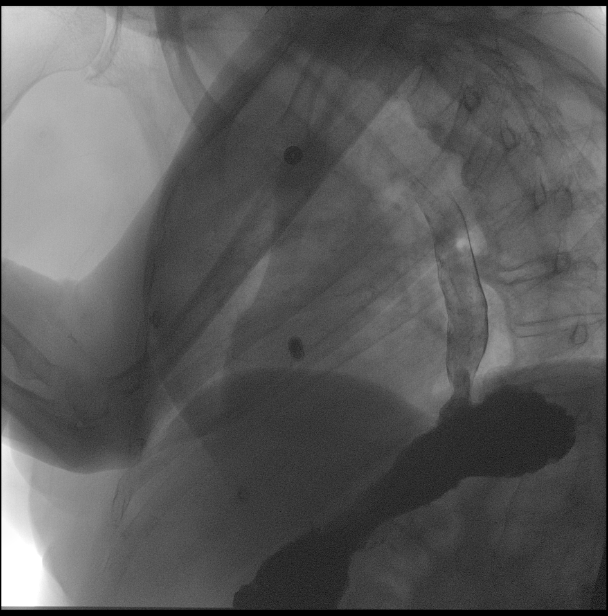

[Series 8: cp_standard · 0.28mm/px · 1 of 1 slices shown (5 of 6)]
[im 1/1]
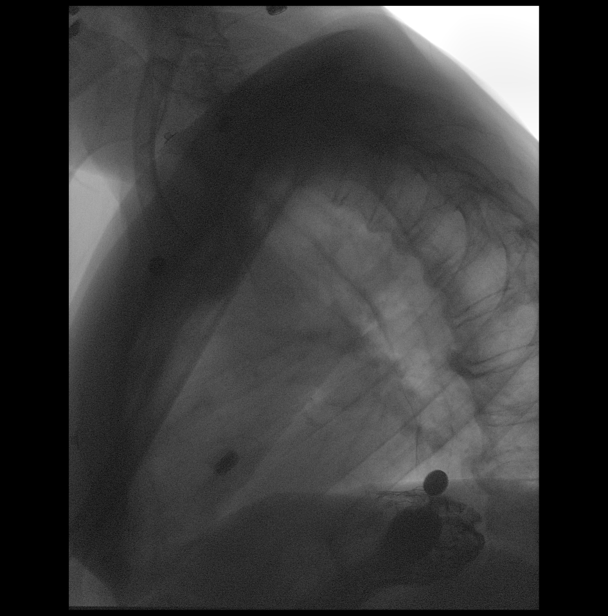

[Series 11: fluoro_barium 2fps_bw · 0.19mm/px · 2 of 2 frames shown (2 of 2)]
[frame 1/2]
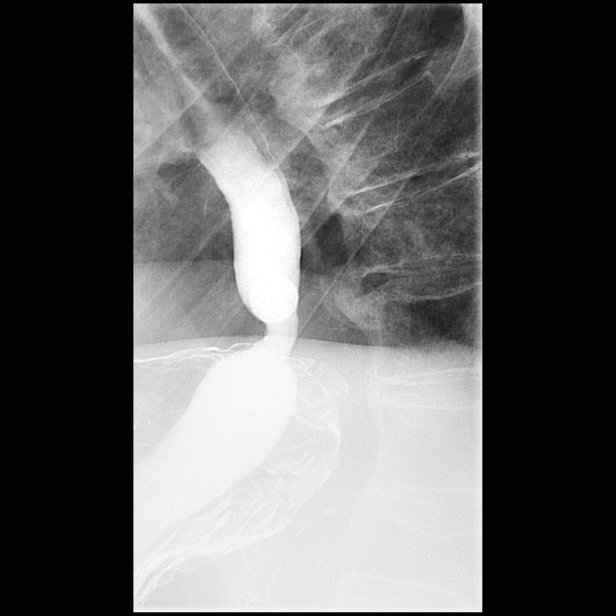
[frame 2/2]
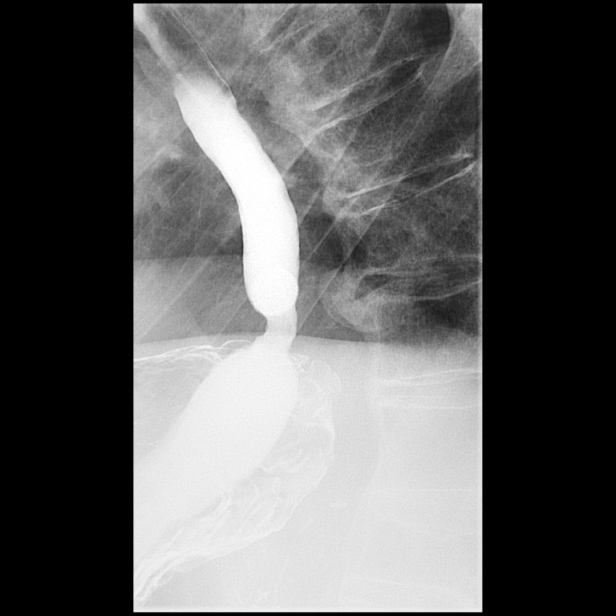

[Series 12: cp_standard · 0.28mm/px · 2 of 78 frames shown (6 of 6)]
[frame 12/78]
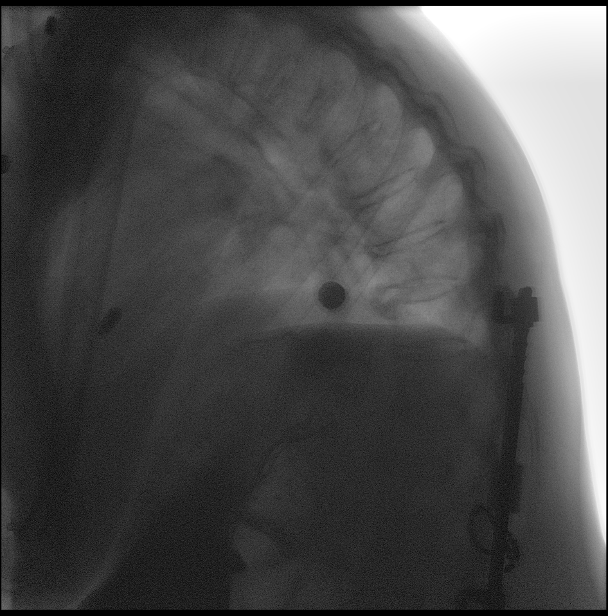
[frame 67/78]
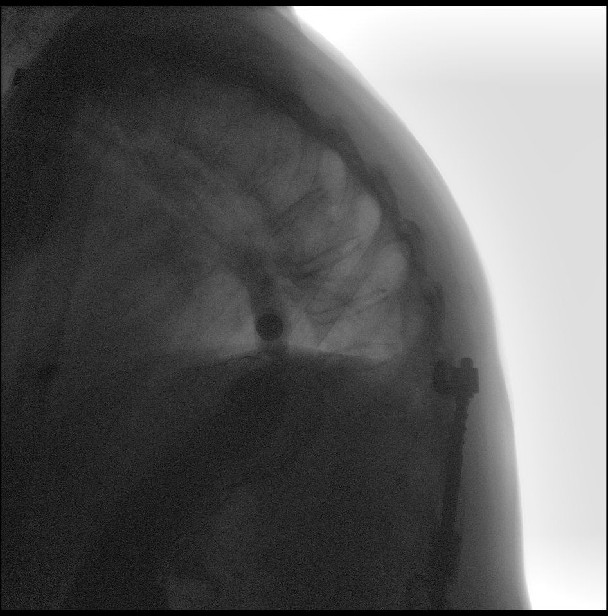

[14 of 24 positions shown; findings below may reference images not displayed]

FINDINGS: Somewhat technically limited by patient positioning. Normal
pharyngeal contours. There is transient penetration but no
aspiration. Normal esophageal motility. Relative narrowing of the
distal esophagus without holdup of contrast. However, barium pill
was held up at this location despite multiple swallows of water.
Gastroesophageal reflux is present. No hiatal hernia.
IMPRESSION: Relative narrowing of the distal esophagus. This did not holdup
passage of contrast but did prevent passage of barium pill despite
several swallows of water. Consider endoscopic evaluation.

Gastrostomy of reflux.

## 2021-06-17 NOTE — Telephone Encounter (Signed)
Unable to reach call would not connect   ?

## 2021-06-19 NOTE — Unmapped (Unsigned)
ERROR chills, nausea, vomiting, chest pain or shortness of breath,a history of,fevers,chills,nausea,vomiting,chest pain,shortness of breath}.     PAST MEDICAL HISTORY:     Past Medical History:   Diagnosis Date    Atrial fibrillation (CMS-HCC)     Basal cell carcinoma     Cancer (CMS-HCC)     Cirrhosis (CMS-HCC)     Depression     Diabetes (CMS-HCC)     Headache     Hepatitis C 07/17/2012    History of transfusion     Liver disease     Low back pain 07/17/2012    Varices, esophageal (CMS-HCC)        PAST SURGICAL HISTORY:   Past Surgical History:   Procedure Laterality Date    ANKLE SURGERY      BACK SURGERY      BACK SURGERY      CARDIAC SURGERY      pacemaker    CHG US GUIDE, TISSUE ABLATION N/A 01/22/2016    Procedure: ULTRASOUND GUIDANCE FOR, AND MONITORING OF, PARENCHYMAL TISSUE ABLATION;  Surgeon: Particia Nearing, MD;  Location: MAIN OR [place];  Service: Transplant    IR EMBOLIZATION ORGAN ISCHEMIA, TUMORS, INFAR  06/16/2016    IR EMBOLIZATION ORGAN ISCHEMIA, TUMORS, INFAR 06/16/2016 Ammie Dalton, MD IMG VIR [facility]    PR COLON CA SCRN NOT HI RSK IND N/A 02/27/2015    Procedure: COLOREC CNCR SCR;COLNSCPY NO;  Surgeon: Vonda Antigua, MD;  Location: [facility];  Service: Gastroenterology    PR ENDOSCOPIC ULTRASOUND EXAM N/A 02/27/2015    Procedure: UGI ENDO; W/ENDO ULTRASOUND EXAM INCLUDES ESOPHAGUS, STOMACH, &/OR DUODENUM/JEJUNUM;  Surgeon: Vonda Antigua, MD;  Location: [facility];  Service: Gastroenterology    PR ERCP BALLOON DILATE BILIARY/PANC DUCT/AMPULLA EA N/A 02/10/2017    Procedure: ERCP;WITH TRANS-ENDOSCOPIC BALLOON DILATION OF BILIARY/PANCREATIC DUCT(S) OR OF AMPULLA, INCLUDING SPHINCTERECTOMY, WHEN PERFOREMD,EACH DUCT (16109);  Surgeon: Mayford Knife, MD;  Location: [facility];  Service: Gastroenterology    PR ERCP REMOVE FOREIGN BODY/STENT BILIARY/PANC DUCT N/A 02/10/2017    Procedure: ENDOSCOPIC RETROGRADE CHOLANGIOPANCREATOGRAPHY (ERCP); W/ REMOVAL OF FOREIGN BODY/STENT FROM BILIARY/PANCREATIC DUCT(S);  Surgeon: Mayford Knife, MD;  Location: [facility];  Service: Gastroenterology    PR ERCP REMOVE FOREIGN BODY/STENT BILIARY/PANC DUCT N/A 06/29/2017    Procedure: ENDOSCOPIC RETROGRADE CHOLANGIOPANCREATOGRAPHY (ERCP); W/ REMOVAL OF FOREIGN BODY/STENT FROM BILIARY/PANCREATIC DUCT(S);  Surgeon: Vonda Antigua, MD;  Location: [facility];  Service: Gastroenterology    PR ERCP STENT PLACEMENT BILIARY/PANCREATIC DUCT N/A 11/29/2016    Procedure: ENDOSCOPIC RETROGRADE CHOLANGIOPANCREATOGRAPHY (ERCP); WITH PLACEMENT OF ENDOSCOPIC STENT INTO BILIARY OR PANCREATIC DUCT;  Surgeon: Chriss Driver, MD;  Location: [facility];  Service: Gastroenterology    PR ERCP STENT PLACEMENT BILIARY/PANCREATIC DUCT N/A 04/26/2017    Procedure: ENDOSCOPIC RETROGRADE CHOLANGIOPANCREATOGRAPHY (ERCP); WITH PLACEMENT OF ENDOSCOPIC STENT INTO BILIARY OR PANCREATIC DUCT;  Surgeon: Mayford Knife, MD;  Location: [facility];  Service: Gastroenterology    PR ERCP,W/REMOVAL STONE,BIL/PANCR DUCTS N/A 11/29/2016    Procedure: ERCP; W/ENDOSCOPIC RETROGRADE REMOVAL OF CALCULUS/CALCULI FROM BILIARY &/OR PANCREATIC DUCTS;  Surgeon: Chriss Driver, MD;  Location: [facility];  Service: Gastroenterology    PR ERCP,W/REMOVAL STONE,BIL/PANCR DUCTS N/A 02/10/2017    Procedure: ERCP; W/ENDOSCOPIC RETROGRADE REMOVAL OF CALCULUS/CALCULI FROM BILIARY &/OR PANCREATIC DUCTS;  Surgeon: Mayford Knife, MD;  Location: [facility];  Service: Gastroenterology  PR ERCP,W/REMOVAL STONE,BIL/PANCR DUCTS N/A 04/26/2017    Procedure: ERCP; W/ENDOSCOPIC RETROGRADE REMOVAL OF CALCULUS/CALCULI FROM BILIARY &/OR PANCREATIC DUCTS;  Surgeon: Mayford Knife, MD;  Location: GI [facility]URES [facility];  Service: Gastroenterology    PR ERCP,W/REMOVAL STONE,BIL/PANCR DUCTS N/A 06/29/2017    Procedure: ERCP; W/ENDOSCOPIC RETROGRADE REMOVAL OF CALCULUS/CALCULI FROM BILIARY &/OR PANCREATIC DUCTS;  Surgeon: Vonda Antigua, MD;  Location: [facility]URES [facility];  Service: Gastroenterology    PR ESOPHAGOSCP RIG TRANSORAL HYPOPHARYNX CRV ESOPH N/A 04/04/2018    Procedure: ESOPHAGOSCOPY, RIGID, TRANSORAL WITH DIVERTICULECTOMY OF HYPOPHARYNX OR CERVICAL ESOPHAGUS, WITH CRICOPHARYNGEAL MYOTOMY, INCLUDES USE OF TELESCOPE OR OPERATING MICROSCOPE AND REPAIR, WHEN PERFORMED;  Surgeon: Vista Deck, MD;  Location: MAIN OR [facility];  Service: ENT    PR INSER HEART TEMP PACER ONE CHMBR N/A 10/02/2016    Procedure: Tempoarary Pacemaker Insertion;  Surgeon: Meredith Leeds, MD;  Location: [facility];  Service: Cardiology    PR LAP,DIAGNOSTIC ABDOMEN N/A 01/22/2016    Procedure: Laparoscopy, Abdomen, Peritoneum, & Omentum, Diagnostic, W/Wo Collection Specimen(S) By Brushing Or Washing;  Surgeon: Particia Nearing, MD;  Location: MAIN OR [facility];  Service: Transplant    PR LARYNGOPLASTY MEDIALIZATION Neysa Bonito N/A 11/07/2018    Procedure: R31 LARYNGOPLASTY, MEDIALIZATION, UNILATERAL;  Surgeon: Vista Deck, MD;  Location: MAIN OR [facility];  Service: ENT    PR PLACE PERCUT GASTROSTOMY TUBE N/A 11/17/2016    Procedure: UGI ENDO; W/DIRECTED PLCMT PERQ GASTROSTOMY TUBE;  Surgeon: Cletis Athens, MD;  Location: [facility];  Service: Gastroenterology    PR TRACHEOSTOMY, PLANNED N/A 09/29/2016    Procedure: TRACHEOSTOMY PLANNED (SEPART PROC);  Surgeon: Katherina Mires, MD;  Location: MAIN OR [facility];  Service: Trauma    PR TRANSCATH INSERT OR REPLACE LEADLESS PM VENTR N/A 10/11/2016    Procedure: Pacemaker Implant/Replace Leadless;  Surgeon: Meredith Leeds, MD;  Location: [facility];  Service: Cardiology    PR TRANSPLANT LIVER,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    Procedure: Liver Allotransplantation; Orthotopic, Partial Or Whole, From Cadaver Or Living Donor, Any Age;  Surgeon: Doyce Loose, MD; Location: MAIN OR [facility];  Service: Transplant    PR TRANSPLANT,PREP DONOR LIVER, WHOLE N/A 09/15/2016    Procedure: Rogelia Boga Std Prep Cad Donor Whole Liver Gft Prior Tnsplnt,Inc Chole,Diss/Rem Surr Tissu Wo Triseg/Lobe Splt;  Surgeon: Doyce Loose, MD;  Location: MAIN OR [facility];  Service: Transplant    PR UPPER GI ENDOSCOPY,BIOPSY N/A 07/17/2012    Procedure: UGI ENDOSCOPY; WITH BIOPSY, SINGLE OR MULTIPLE;  Surgeon: Alba Destine, MD;  Location: [facility];  Service: Gastroenterology    PR UPPER GI ENDOSCOPY,DIAGNOSIS N/A 02/04/2014    Procedure: UGI ENDO, INCLUDE ESOPHAGUS, STOMACH, & DUODENUM &/OR JEJUNUM; DX W/WO COLLECTION SPECIMN, BY BRUSH OR WASH;  Surgeon: Wilburt Finlay, MD;  Location: [facility];  Service: Gastroenterology    PR UPPER GI ENDOSCOPY,LIGAT VARIX N/A 11/05/2013    Procedure: UGI ENDO; Everlene Balls LIG ESOPH &/OR GASTRIC VARICES;  Surgeon: Wilburt Finlay, MD;  Location: [facility]URES [facility];  Service: Gastroenterology       ALLERGIES:    has No Known Allergies.    MEDICATIONS:  Current Outpatient Medications   Medication Sig Dispense Refill    amiodarone (PACERONE) 200 MG tablet Take by mouth.      BASAGLAR KWIKPEN U-100 INSULIN 100 unit/mL (3 mL) injection pen Inject 14 Units subcutaneously at bedtime 15 mL 4    bisacodyL (DULCOLAX) 5 mg EC  tablet Take 5 mg by mouth daily as needed.       blood-glucose meter (GLUCOSE MONITORING KIT) kit Use as instructed 1 each 0    cholecalciferol, vitamin D3, 125 mcg (5,000 unit) tablet Take 125 mcg by mouth daily.      cycloSPORINE modified (NEORAL) 100 MG capsule Take 2 capsules (200 mg total) by mouth daily AND 1 capsule (100 mg total) nightly. 270 capsule 3    cycloSPORINE modified (NEORAL) 25 MG capsule Take 3 capsules (75 mg total) by mouth nightly (with a 100mg  capsule). For a total dose of 200 mg in the morning , 175mg  every evening 270 capsule 3    DULoxetine (CYMBALTA) 30 MG capsule Take 2 capsules (60 mg total) by mouth nightly. 60 capsule 5    magnesium oxide-Mg AA chelate (MAGNESIUM, AMINO ACID CHELATE,) 133 mg Take 2 tablets by mouth Two (2) times a day. 120 tablet PRN    melatonin 3 mg Tab Take 1 tablet (3 mg total) by mouth nightly. 30 tablet 0    metoprolol succinate (TOPROL-XL) 25 MG 24 hr tablet Take 25 mg by mouth.      mirtazapine (REMERON) 15 MG tablet Take 1 tablet (15 mg total) by mouth nightly. 90 tablet 3    mycophenolate (CELLCEPT) 250 mg capsule Take 2 capsules (500 mg total) by mouth two (2) times a day. 120 capsule 11    pantoprazole (PROTONIX) 40 MG tablet Take 1 tablet by mouth daily.      pen needle, diabetic (PEN NEEDLE) 32 gauge x 5/32 (4 mm) Ndle Use as directed four times daily 100 each 1    pen needle, diabetic (ULTICARE PEN NEEDLE) 32 gauge x 5/32 (4 mm) Ndle USE WITH INSULIN PEN 4 TIMES DAILY AS DIRECTED 100 each 1    predniSONE (DELTASONE) 5 MG tablet Take 4 tablets (20 mg) by mouth for one day, then take 3 tablets (15 mg) the next day, then take 2 tablets (10 mg) the next day, then take 1 tablet (5mg ) then stop (Patient not taking: Reported on 02/10/2021) 10 tablet 0    pregabalin (LYRICA) 100 MG capsule Take 1 capsule (100 mg total) by mouth 2 (two) times daily 60 capsule 11    pregabalin (LYRICA) 75 MG capsule Take 1 capsule (75 mg total) by mouth Two (2) times a day. 60 capsule 6    rosuvastatin (CRESTOR) 5 MG tablet Take 1 tablet (5 mg total) by mouth daily. 90 tablet 1    sildenafiL, pulm.hypertension, (REVATIO) 20 mg tablet Take 2 tablets (40 mg total) by mouth Three (3) times a day. 180 tablet 6    tamsulosin (FLOMAX) 0.4 mg capsule Take 1 capsule (0.4 mg total) by mouth daily. 90 capsule 0     No current facility-administered medications for this visit.        SOCIAL HISTORY:  Social History     Tobacco Use    Smoking status: Never    Smokeless tobacco: Never    Tobacco comments:     Smoked in high school for about 5 years.    Substance Use Topics    Alcohol use: No    Drug use: No        FAMILY HISTORY:  {NEGATIVE / POSITIVE / UNKNOWN:20651} for prostate cancer disease.  Family History   Problem Relation Age of Onset    Hypertension Mother     Cancer Mother     Heart disease Mother     Heart  disease Father     Cirrhosis Neg Hx     Liver cancer Neg Hx     Anemia Neg Hx     Diabetes Neg Hx     Kidney disease Neg Hx     Obesity Neg Hx     Thyroid disease Neg Hx     Osteoporosis Neg Hx     Coronary artery disease Neg Hx     Anesthesia problems Neg Hx     Basal cell carcinoma Neg Hx     Squamous cell carcinoma Neg Hx        REVIEW OF SYSTEMS:  A 10-system review of systems was completed  by the patient and reviewed by me today.  All pertinent positives  were discussed in history of present illness.    PHYSICAL EXAMINATION:  VITAL SIGNS:  The patient is afebrile.  Vital signs are stable.  GENERAL:  In no apparent distress.  HEENT:  Eyes, ears and mouth appeared normal.  LYMPHATICS:  No cervical lymphadenopathy.  LUNGS:  Chest wall excursion symmetric bilaterally. No audible wheezing  ABDOMEN:  Soft, nontender, nondistended.  BACK:  Negative CVA tenderness.     DRE: Prostate enlarged, smooth, soft no nodules  EXTREMITIES:  No lower extremity edema.  SKIN:  No rashes or jaundice.  NEUROLOGIC:  No focal deficits on neurologic exam.    LABORATORY TESTING:      Chemistry        Component Value Date/Time    NA 139 05/17/2021 1307    NA 141 09/26/2016 1640    K 4.8 05/17/2021 1307    K 4.1 09/26/2016 1640    CL 108 (H) 05/17/2021 1307    CL 106 09/25/2018 1715    CO2 22.0 05/17/2021 1307    CO2 29 10/10/2013 1417    BUN 37 (H) 05/17/2021 1307    BUN 34 (H) 09/25/2018 1715    CREATININE 1.93 (H) 05/17/2021 1307    CREATININE 1.0 09/25/2015 1042    GLU 244 (H) 05/17/2021 1307        Component Value Date/Time    CALCIUM 9.8 05/17/2021 1307    CALCIUM 9.3 10/10/2013 1417    ALKPHOS 75 05/17/2021 1307    ALKPHOS 214 (H) 01/09/2014 1029    AST 12 05/17/2021 1307    AST 46 01/09/2014 1029    ALT 11 05/17/2021 1307    ALT 41 01/09/2014 1029    BILITOT 0.6 05/17/2021 1307    BILITOT 0.5 12/12/2017 0830                  06/22/2021 AUA symptom score total of *** with bother of ***  06/22/2021 postvoid residual 26 ml

## 2021-06-22 ENCOUNTER — Ambulatory Visit: Admit: 2021-06-22 | Discharge: 2021-06-22 | Payer: MEDICARE | Attending: Urology | Primary: Urology

## 2021-06-22 DIAGNOSIS — N400 Enlarged prostate without lower urinary tract symptoms: Principal | ICD-10-CM

## 2021-06-22 NOTE — Unmapped (Addendum)
Reviewed urinary symptoms and post residual in clinic today  Reviewed prior PSA results  Discussed behavioral changes recommendations including:  1.  Timed voiding every 2-3 hours by the clock and not on sensation alone  2.  Double voiding-trying to urinate again shortly after going to see if you can empty more  3.  Continue good bowel regimen to minimize constipation episodes  4.  Shift fluid intake earlier in the day and less in the evening hours and at night to decrease nighttime voiding  5.  Minimizing high diuretic type fluids such as tea, soda, alcohol, coffee which will increase urination frequency  Stress importance of better diabetes and glucose control in the setting of worsening urine symptoms  Continue tamsulosin medication  Consider medication for overactive bladder symptoms  Discussed erectile dysfunction issues and likely relationship to sugar control; can consider medication therapy in the future  Return to clinic in 6 to 8 months to reassess symptoms after improved diabetes control

## 2021-06-22 NOTE — Unmapped (Signed)
ANALYSIS AND PLAN:  This is a 71 y.o. 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 I reviewed with the patient today his urinary complaints, symptoms and evaluation in the clinic. I reviewed PSA values which have been within normal limits with last read in 2018. His postvoid residual in clinic 26 mL. Digital rectal exam noted was not concerning for malignancy though the patient does have mild enlargement of the prostate.  I counseled patient on the natural history of BPH. As the prostate enlarges, men typically either experience two type of urinary symptoms: irritative/storage and obstructive symptoms. I discussed contributing factor of poorly controlled diabetes to worsened, progressive lower urinary tract symptoms and erectile dysfunction. I discussed utility of medication for BPH and overactive bladder. The risk and side effects of the medications were discussed. Although patient may pursue medical therapy for BPH or overactive bladder (anticholinergic versus beta agonist), this will not the most effective treatment modality and I discussed it is integral the patient obtain good control of his diabetes. His last hemoglobin A1C was 11.4 and average blood glucose was 280. I reviewed increased risk for permanent bladder damage and erectile dysfunction in chronically poorly managed diabetes. We discussed the patient's current ED complaints and the utility of medication but decreased efficacy in the setting of his diabetes control. We discussed the importance of behavioral modifications including timed and double voiding, avoiding constipation episodes, shifting fluid intake earlier in the day to decrease nocturia at night.     I strongly encouraged the patient to work with his endocrinology team in managing his Type 2 diabetes.  The patient will follow-up in approximately 6 to 8 months to reassess symptoms. We will consider medical therapy if symptoms persist despite well controlled diabetes. We will be happy to see him back anytime sooner if symptoms arise.    Jettie Booze, MD  [address]  [address]  [place],  [place] [address]    CHIEF COMPLAINT: History of BPH and bothersome lower urinary tract symptoms here for evaluation.    BRIEF HISTORY OF PRESENT ILLNESS:  Jeffrey Ward is very pleasant 71 y.o. male who was recently referred by Jettie Booze for the evaluation of bothersome lower urinary tract symptoms and BPH. The patient notes urinary complaints for a prolonged amount of time. He has previously been maintained on medication therapy including Tamsulosin 0.4 mg. Patient does not recollect he is on Tamsulosin 0.4 mg as he is on a host of medications due to his complex medical history, although this is on his medication list on Epic. His primary urinary complaints include intermittency, frequency/urgency, weak urinary stream, and nocturia 8-10x that have worsened over the years. He feels incomplete bladder emptying with small voids. He has severe urge incontinence. Urinary symptoms started since he injured his back in 2020.  The patient reports a history of pyelonephritis and is not on any medical treatment for this. He is followed by nephrology. Patient denies history of nephrolithiasis. He also complains of erectile dysfunction.     He has poorly controlled Type 2 diabetes and is partially compliant with Insulin. The patient denies a history of dysuria, hematuria or urinary tract infection.  He has never had a prostate biopsy in the past. He has no family history of prostate cancer. He does not have a history of sleep apnea or significant snoring. The patient has no history of fevers, chills, nausea, vomiting, chest pain or shortness of breath.     PAST MEDICAL HISTORY:     Past Medical History:   Diagnosis Date    Atrial fibrillation (CMS-HCC)     Basal cell carcinoma     Cancer (CMS-HCC)     Cirrhosis (CMS-HCC)     Depression     Diabetes (CMS-HCC)     Headache     Hepatitis C 07/17/2012    History of transfusion     Liver disease     Low back pain 07/17/2012    Varices, esophageal (CMS-HCC)        PAST SURGICAL HISTORY:   Past Surgical History:   Procedure Laterality Date    ANKLE SURGERY      BACK SURGERY      BACK SURGERY      CARDIAC SURGERY      pacemaker    CHG US GUIDE, TISSUE ABLATION N/A 01/22/2016    Procedure: ULTRASOUND GUIDANCE FOR, AND MONITORING OF, PARENCHYMAL TISSUE ABLATION;  Surgeon: Particia Nearing, MD;  Location: MAIN OR [facility];  Service: Transplant    IR EMBOLIZATION ORGAN ISCHEMIA, TUMORS, INFAR  06/16/2016    IR EMBOLIZATION ORGAN ISCHEMIA, TUMORS, INFAR 06/16/2016 Ammie Dalton, MD IMG VIR H[facility]    PR COLON CA SCRN NOT HI RSK IND N/A 02/27/2015    Procedure: COLOREC CNCR SCR;COLNSCPY NO;  Surgeon: Vonda Antigua, MD;  Location: [facility];  Service: Gastroenterology    PR ENDOSCOPIC ULTRASOUND EXAM N/A 02/27/2015    Procedure: UGI ENDO; W/ENDO ULTRASOUND EXAM INCLUDES ESOPHAGUS, STOMACH, &/OR DUODENUM/JEJUNUM;  Surgeon: Vonda Antigua, MD;  [facility]: [facility];  Service: Gastroenterology    PR ERCP BALLOON DILATE BILIARY/PANC DUCT/AMPULLA EA N/A 02/10/2017    Procedure: ERCP;WITH TRANS-ENDOSCOPIC BALLOON DILATION OF BILIARY/PANCREATIC DUCT(S) OR OF AMPULLA, INCLUDING SPHINCTERECTOMY, WHEN PERFOREMD,EACH DUCT ([id number]);  Surgeon: Mayford Knife, MD;  Location: [facility];  Service: Gastroenterology    PR ERCP REMOVE FOREIGN BODY/STENT BILIARY/PANC DUCT N/A 02/10/2017    Procedure: ENDOSCOPIC RETROGRADE CHOLANGIOPANCREATOGRAPHY (ERCP); W/ REMOVAL OF FOREIGN BODY/STENT FROM BILIARY/PANCREATIC DUCT(S);  Surgeon: Mayford Knife, MD;  Location: [facility]URES [facility];  Service: Gastroenterology    PR ERCP REMOVE FOREIGN BODY/STENT BILIARY/PANC DUCT N/A 06/29/2017    Procedure: ENDOSCOPIC RETROGRADE CHOLANGIOPANCREATOGRAPHY (ERCP); W/ REMOVAL OF FOREIGN BODY/STENT FROM BILIARY/PANCREATIC DUCT(S);  Surgeon: Vonda Antigua, MD;  [facility]: [facility];  Service: Gastroenterology    PR ERCP STENT PLACEMENT BILIARY/PANCREATIC DUCT N/A 11/29/2016    Procedure: ENDOSCOPIC RETROGRADE CHOLANGIOPANCREATOGRAPHY (ERCP); WITH PLACEMENT OF ENDOSCOPIC STENT INTO BILIARY OR PANCREATIC DUCT;  Surgeon: Chriss Driver, MD;  Location: [facility];  Service: Gastroenterology    PR ERCP STENT PLACEMENT BILIARY/PANCREATIC DUCT N/A 04/26/2017    Procedure: ENDOSCOPIC RETROGRADE CHOLANGIOPANCREATOGRAPHY (ERCP); WITH PLACEMENT OF ENDOSCOPIC STENT INTO BILIARY OR PANCREATIC DUCT;  Surgeon: Mayford Knife, MD;  Location: GI [facility]URES [facility];  Service: Gastroenterology    PR ERCP,W/REMOVAL STONE,BIL/PANCR DUCTS N/A 11/29/2016    Procedure: ERCP; W/ENDOSCOPIC RETROGRADE REMOVAL OF CALCULUS/CALCULI FROM BILIARY &/OR PANCREATIC DUCTS;  Surgeon: Chriss Driver, MD;  Location: GI [facility]URES [facility];  Service: Gastroenterology    PR ERCP,W/REMOVAL STONE,BIL/PANCR DUCTS N/A 02/10/2017  Procedure: ERCP; W/ENDOSCOPIC RETROGRADE REMOVAL OF CALCULUS/CALCULI FROM BILIARY &/OR PANCREATIC DUCTS;  Surgeon: Mayford Knife, MD;  Location: [facility];  Service: Gastroenterology    PR ERCP,W/REMOVAL STONE,BIL/PANCR DUCTS N/A 04/26/2017    Procedure: ERCP; W/ENDOSCOPIC RETROGRADE REMOVAL OF CALCULUS/CALCULI FROM BILIARY &/OR PANCREATIC DUCTS;  Surgeon: Mayford Knife, MD;  Location: [facility]URES [facility];  Service: Gastroenterology    PR ERCP,W/REMOVAL STONE,BIL/PANCR DUCTS N/A 06/29/2017    Procedure: ERCP; W/ENDOSCOPIC RETROGRADE REMOVAL OF CALCULUS/CALCULI FROM BILIARY &/OR PANCREATIC DUCTS;  Surgeon: Vonda Antigua, MD;  Location: GI [facility]URES [facility];  Service: Gastroenterology    PR ESOPHAGOSCP RIG TRANSORAL HYPOPHARYNX CRV ESOPH N/A 04/04/2018    Procedure: ESOPHAGOSCOPY, RIGID, TRANSORAL WITH DIVERTICULECTOMY OF HYPOPHARYNX OR CERVICAL ESOPHAGUS, WITH CRICOPHARYNGEAL MYOTOMY, INCLUDES USE OF TELESCOPE OR OPERATING MICROSCOPE AND REPAIR, WHEN PERFORMED;  Surgeon: Vista Deck, MD;  Location: MAIN OR [facility];  Service: ENT    PR INSER HEART TEMP PACER ONE CHMBR N/A 10/02/2016    Procedure: Tempoarary Pacemaker Insertion;  Surgeon: Meredith Leeds, MD;  Location: [facility];  Service: Cardiology    PR LAP,DIAGNOSTIC ABDOMEN N/A 01/22/2016    Procedure: Laparoscopy, Abdomen, Peritoneum, & Omentum, Diagnostic, W/Wo Collection Specimen(S) By Brushing Or Washing;  Surgeon: Particia Nearing, MD;  Location: MAIN OR [facility];  Service: Transplant    PR LARYNGOPLASTY MEDIALIZATION Neysa Bonito N/A 11/07/2018    Procedure: R31 LARYNGOPLASTY, MEDIALIZATION, UNILATERAL;  Surgeon: Vista Deck, MD;  Location: MAIN OR [facility];  Service: ENT    PR PLACE PERCUT GASTROSTOMY TUBE N/A 11/17/2016    Procedure: UGI ENDO; W/DIRECTED PLCMT PERQ GASTROSTOMY TUBE;  Surgeon: Cletis Athens, MD;  Location: [facility];  Service: Gastroenterology    PR TRACHEOSTOMY, PLANNED N/A 09/29/2016    Procedure: TRACHEOSTOMY PLANNED (SEPART PROC);  Surgeon: Katherina Mires, MD;  Location: MAIN OR [facility];  Service: Trauma    PR TRANSCATH INSERT OR REPLACE LEADLESS PM VENTR N/A 10/11/2016    Procedure: Pacemaker Implant/Replace Leadless;  Surgeon: Meredith Leeds, MD;  Location: [facility];  Service: Cardiology    PR TRANSPLANT LIVER,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    Procedure: Liver Allotransplantation; Orthotopic, Partial Or Whole, From Cadaver Or Living Donor, Any Age;  Surgeon: Doyce Loose, MD;  Location: MAIN OR [facility];  Service: Transplant    PR TRANSPLANT,PREP DONOR LIVER, WHOLE N/A 09/15/2016    Procedure: Rogelia Boga Std Prep Cad Donor Whole Liver Gft Prior Tnsplnt,Inc Chole,Diss/Rem Surr Tissu Wo Triseg/Lobe Splt;  Surgeon: Doyce Loose, MD;  Location: MAIN OR [facility];  Service: Transplant    PR UPPER GI ENDOSCOPY,BIOPSY N/A 07/17/2012    Procedure: UGI ENDOSCOPY; WITH BIOPSY, SINGLE OR MULTIPLE;  Surgeon: Alba Destine, MD;  Location: [facility];  Service: Gastroenterology    PR UPPER GI ENDOSCOPY,DIAGNOSIS N/A 02/04/2014    Procedure: UGI ENDO, INCLUDE ESOPHAGUS, STOMACH, & DUODENUM &/OR JEJUNUM; DX W/WO COLLECTION SPECIMN, BY BRUSH OR WASH;  Surgeon: Wilburt Finlay, MD;  Location: [facility];  Service: Gastroenterology    PR UPPER GI ENDOSCOPY,LIGAT VARIX N/A 11/05/2013    Procedure: UGI ENDO; Everlene Balls LIG ESOPH &/OR GASTRIC VARICES;  Surgeon: Wilburt Finlay, MD;  Location: [facility];  Service: Gastroenterology       ALLERGIES:    has No Known Allergies.    MEDICATIONS:  Current Outpatient Medications   Medication Sig Dispense Refill    amiodarone (PACERONE) 200 MG tablet Take by mouth.  BASAGLAR KWIKPEN U-100 INSULIN 100 unit/mL (3 mL) injection pen Inject 14 Units subcutaneously at bedtime 15 mL 4    bisacodyL (DULCOLAX) 5 mg EC tablet Take 1 tablet (5 mg total) by mouth daily as needed.      blood-glucose meter (GLUCOSE MONITORING KIT) kit Use as instructed 1 each 0    cholecalciferol, vitamin D3, 125 mcg (5,000 unit) tablet Take 1 tablet (125 mcg total) by mouth daily.      cycloSPORINE modified (NEORAL) 100 MG capsule Take 2 capsules (200 mg total) by mouth daily AND 1 capsule (100 mg total) nightly. 270 capsule 3    cycloSPORINE modified (NEORAL) 25 MG capsule Take 3 capsules (75 mg total) by mouth nightly (with a 100mg  capsule). For a total dose of 200 mg in the morning , 175mg  every evening 270 capsule 3    DULoxetine (CYMBALTA) 30 MG capsule Take 2 capsules (60 mg total) by mouth nightly. 60 capsule 5    magnesium oxide-Mg AA chelate (MAGNESIUM, AMINO ACID CHELATE,) 133 mg Take 2 tablets by mouth Two (2) times a day. 120 tablet PRN    melatonin 3 mg Tab Take 1 tablet (3 mg total) by mouth nightly. 30 tablet 0    metoprolol succinate (TOPROL-XL) 25 MG 24 hr tablet Take 1 tablet (25 mg total) by mouth.      mycophenolate (CELLCEPT) 250 mg capsule Take 2 capsules (500 mg total) by mouth two (2) times a day. 120 capsule 11    pantoprazole (PROTONIX) 40 MG tablet Take 1 tablet (40 mg total) by mouth daily.      pen needle, diabetic (PEN NEEDLE) 32 gauge x 5/32 (4 mm) Ndle Use as directed four times daily 100 each 1    predniSONE (DELTASONE) 5 MG tablet Take 4 tablets (20 mg) by mouth for one day, then take 3 tablets (15 mg) the next day, then take 2 tablets (10 mg) the next day, then take 1 tablet (5mg ) then stop 10 tablet 0    pregabalin (LYRICA) 100 MG capsule Take 1 capsule (100 mg total) by mouth 2 (two) times daily 60 capsule 11    pregabalin (LYRICA) 75 MG capsule Take 1 capsule (75 mg total) by mouth Two (2) times a day. 60 capsule 6    rosuvastatin (CRESTOR) 5 MG tablet Take 1 tablet (5 mg total) by mouth daily. 90 tablet 1    sildenafiL, pulm.hypertension, (REVATIO) 20 mg tablet Take 2 tablets (40 mg total) by mouth Three (3) times a day. 180 tablet 6    tamsulosin (FLOMAX) 0.4 mg capsule Take 1 capsule (0.4 mg total) by mouth daily. 90 capsule 0    mirtazapine (REMERON) 15 MG tablet Take 1 tablet (15 mg total) by mouth nightly. 90 tablet 3    pen needle, diabetic (ULTICARE PEN NEEDLE) 32 gauge x 5/32 (4 mm) Ndle USE WITH INSULIN PEN 4 TIMES DAILY AS DIRECTED 100 each 1     No current facility-administered medications for this visit.        SOCIAL HISTORY:  Social History     Tobacco Use    Smoking status: Never    Smokeless tobacco: Never    Tobacco comments:     Smoked in high school for about 5 years.    Substance Use Topics    Alcohol use: No    Drug use: No FAMILY HISTORY:  Unknown for prostate cancer disease.  Family History   Problem Relation Age of Onset  Hypertension Mother     Cancer Mother     Heart disease Mother     Heart disease Father     Cirrhosis Neg Hx     Liver cancer Neg Hx     Anemia Neg Hx     Diabetes Neg Hx     Kidney disease Neg Hx     Obesity Neg Hx     Thyroid disease Neg Hx     Osteoporosis Neg Hx     Coronary artery disease Neg Hx     Anesthesia problems Neg Hx     Basal cell carcinoma Neg Hx     Squamous cell carcinoma Neg Hx        REVIEW OF SYSTEMS:  A 10-system review of systems was completed  by the patient and reviewed by me today.  All pertinent positives  were discussed in history of present illness.    PHYSICAL EXAMINATION:  VITAL SIGNS:  The patient is afebrile.  Vital signs are stable.  GENERAL:  In no apparent distress.  HEENT:  Eyes, ears and mouth appeared normal.  LYMPHATICS:  No cervical lymphadenopathy.  LUNGS:  Chest wall excursion symmetric bilaterally. No audible wheezing  ABDOMEN:  Soft, nontender, nondistended.  BACK:  Negative CVA tenderness.     DRE: Prostate mildly enlarged, smooth, soft no nodules  EXTREMITIES:  No lower extremity edema.  SKIN:  No rashes or jaundice.  NEUROLOGIC:  No focal deficits on neurologic exam.    LABORATORY TESTING:      Chemistry        Component Value Date/Time    NA 139 05/17/2021 1307    NA 141 09/26/2016 1640    K 4.8 05/17/2021 1307    K 4.1 09/26/2016 1640    CL 108 (H) 05/17/2021 1307    CL 106 09/25/2018 1715    CO2 22.0 05/17/2021 1307    CO2 29 10/10/2013 1417    BUN 37 (H) 05/17/2021 1307    BUN 34 (H) 09/25/2018 1715    CREATININE 1.93 (H) 05/17/2021 1307    CREATININE 1.0 09/25/2015 1042    GLU 244 (H) 05/17/2021 1307        Component Value Date/Time    CALCIUM 9.8 05/17/2021 1307    CALCIUM 9.3 10/10/2013 1417    ALKPHOS 75 05/17/2021 1307    ALKPHOS 214 (H) 01/09/2014 1029    AST 12 05/17/2021 1307    AST 46 01/09/2014 1029    ALT 11 05/17/2021 1307    ALT 41 01/09/2014 1029 BILITOT 0.6 05/17/2021 1307    BILITOT 0.5 12/12/2017 0830                  06/22/2021 AUA symptom score total of 35 with bother of 6 and a leakage of 4.  06/22/2021 postvoid residual 26 ml    Scribe's Attestation: Macy Mis, MD obtained and performed the history, physical exam and medical decision making elements that were entered into the chart.  Signed by Carney Harder, Scribe, on June 22, 2021 at 3:14 PM.    ----------------------------------------------------------------------------------------------------------------------  June 24, 2021 9:28 AM. Documentation assistance provided by the Scribe. I was present during the time the encounter was recorded. The information recorded by the Scribe was done at my direction and has been reviewed and validated by me.    Macy Mis MD  ----------------------------------------------------------------------------------------------------------------------

## 2021-06-24 NOTE — Unmapped (Signed)
Called Obe Ahlers and left patient message to call the Urology Clinic back to schedule the below appt(s):  *also sent well text     Provider: Dr. Marcy Salvo  Type of Appt: rtc in 6-8 months    Radiology: n/a      Asked the patient to call back at [phone].    Thank you,    Nobie Putnam  Urology Clinic  [phone]

## 2021-06-25 NOTE — Unmapped (Signed)
[facility] Refill Coordination Note    Specialty Medication(s) to be Shipped:   Transplant: mycophenolate mofetil 250mg , cyclosporine 25mg  and cyclosporine 100mg     Other medication(s) to be shipped: duloxetine, magnesium, pregabalin and silenafil      Jeffrey Ward, DOB: 02/18/1951  Phone: [phone] (home)       All above HIPAA information was verified with patient.     Was a Nurse, learning disability used for this call? No    Completed refill call assessment today to schedule patient's medication shipment from the [facility] [phone]).  All relevant notes have been reviewed.     Specialty medication(s) and dose(s) confirmed: Regimen is correct and unchanged.   Changes to medications: [facility] reports no changes at this time.  Changes to insurance: No  New side effects reported not previously addressed with a pharmacist or physician: None reported  Questions for the pharmacist: No    Confirmed patient received a Conservation officer, historic buildings and a Surveyor, mining with first shipment. The patient will receive a drug information handout for each medication shipped and additional FDA Medication Guides as required.       DISEASE/MEDICATION-SPECIFIC INFORMATION        N/A    SPECIALTY MEDICATION ADHERENCE     Medication Adherence    Patient reported X missed doses in the last month: 0  Specialty Medication: Mycophenolate 250mg   Patient is on additional specialty medications: Yes  Additional Specialty Medications: Cyclosporine 25mg   Patient Reported Additional Medication X Missed Doses in the Last Month: 0  Patient is on more than two specialty medications: Yes  Specialty Medication: Cyclosporine 100mg   Patient Reported Additional Medication X Missed Doses in the Last Month: 0  Support network for adherence: family member        Were doses missed due to medication being on hold? No    Cyclosporine 25 mg: 6 days of medicine on hand   Cyclosporine 100 mg: 6 days of medicine on hand   Mycophenolate 250 mg: 6 days of medicine on hand     REFERRAL TO PHARMACIST     Referral to the pharmacist: Not needed      [facility]     Shipping address confirmed in Epic.     Delivery Scheduled: Yes, Expected medication delivery date: 06/29/2021.     Medication will be delivered via Next Day Courier to the prescription address in Epic WAM.    [name]   [facility] Specialty Technician

## 2021-06-28 MED FILL — CYCLOSPORINE MODIFIED 100 MG CAPSULE: ORAL | 30 days supply | Qty: 90 | Fill #2

## 2021-06-28 MED FILL — SILDENAFIL (PULMONARY HYPERTENSION) 20 MG TABLET: ORAL | 30 days supply | Qty: 180 | Fill #5

## 2021-06-28 MED FILL — MG-PLUS-PROTEIN 133 MG TABLET: ORAL | 30 days supply | Qty: 120 | Fill #3

## 2021-06-28 MED FILL — CYCLOSPORINE MODIFIED 25 MG CAPSULE: ORAL | 30 days supply | Qty: 90 | Fill #2

## 2021-06-28 MED FILL — DULOXETINE 30 MG CAPSULE,DELAYED RELEASE: ORAL | 30 days supply | Qty: 60 | Fill #1

## 2021-06-28 MED FILL — MYCOPHENOLATE MOFETIL 250 MG CAPSULE: ORAL | 30 days supply | Qty: 120 | Fill #5

## 2021-06-28 MED FILL — PREGABALIN 100 MG CAPSULE: 30 days supply | Qty: 60 | Fill #9

## 2021-07-08 ENCOUNTER — Ambulatory Visit: Admit: 2021-07-08 | Discharge: 2021-07-09 | Payer: MEDICARE

## 2021-07-08 DIAGNOSIS — Z794 Long term (current) use of insulin: Principal | ICD-10-CM

## 2021-07-08 DIAGNOSIS — E1122 Type 2 diabetes mellitus with diabetic chronic kidney disease: Principal | ICD-10-CM

## 2021-07-08 DIAGNOSIS — N1832 Stage 3b chronic kidney disease (CMS-HCC): Principal | ICD-10-CM

## 2021-07-08 LAB — ALBUMIN / CREATININE URINE RATIO
ALBUMIN QUANT URINE: 1.7 mg/dL
ALBUMIN/CREATININE RATIO: 11.3 ug/mg (ref 0.0–30.0)
CREATININE, URINE: 150.4 mg/dL

## 2021-07-08 NOTE — Unmapped (Unsigned)
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 Previous evaluation by my colleague creatinine was 1.3 in July 2018 and found to be 2.0 in August 2020. 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 The patient's renal dysfunction felt due to diabetic nephropathy and prior fluid shifts in the post-transplant requiring CRRT to manage pulmonary hypertension/fluid overload. His creatinine has ranged from 1.6-2. Marland Kitchen    HPI: [name] is a 71 y.o. male with above PMH who returns for follow up of chronic kidney disease. Most recent A1C 11.4 05/05/21. Seen in the interim by urology for symptoms of lower urinary tract symptoms suggestive of symptomatic BPH and erectile dysfunction. Felt uncontrolled DM may be causing some of symptoms. He states he has been taking his insulin and blood sugars vary 70 - 500.  He reports he is feeling okay. He denies fevers and chills. His appetite is not great but states it has been that way ever since his transplant and is stable. He states he has lost weight over last several months ar his wife passed away. He had one episode of emesis recently but this resolved without intervention and denies any current nausea or emesis. He denies chest pain. DOE stable. He denies dysuria, hematuria, cola colored urine or flank pain.     ROS: 11 systems reviewed and negative except those noted in the history of present illness    PAST MEDICAL HISTORY:  Past Medical History:   Diagnosis Date    Atrial fibrillation (CMS-HCC)     Basal cell carcinoma     Cancer (CMS-HCC)     Cirrhosis (CMS-HCC)     Depression     Diabetes (CMS-HCC)     Headache     Hepatitis C 07/17/2012    History of transfusion     Liver disease     Low back pain 07/17/2012    Varices, esophageal (CMS-HCC) ALLERGIES  Patient has no known allergies.      MEDICATIONS:  Current Outpatient Medications   Medication Sig Dispense Refill    amiodarone (PACERONE) 200 MG tablet Take by mouth.      BASAGLAR KWIKPEN U-100 INSULIN 100 unit/mL (3 mL) injection pen Inject 14 Units subcutaneously at bedtime 15 mL 4    bisacodyL (DULCOLAX) 5 mg EC tablet Take 1 tablet (5 mg total) by mouth daily as needed.      blood-glucose meter (GLUCOSE MONITORING KIT) kit Use as instructed 1 each 0    cholecalciferol, vitamin D3, 125 mcg (5,000 unit) tablet Take 1 tablet (125 mcg total) by mouth daily.      cycloSPORINE modified (NEORAL) 100 MG capsule Take 2 capsules (200 mg total) by mouth daily AND 1 capsule (100 mg total) nightly. 270 capsule 3    cycloSPORINE modified (NEORAL) 25 MG capsule Take 3 capsules (75 mg total) by mouth nightly (with a 100mg  capsule). For a total dose of 200 mg in the morning , 175mg  every evening 270 capsule 3    DULoxetine (CYMBALTA) 30 MG capsule Take 2 capsules (60 mg total) by mouth nightly. 60 capsule 5    magnesium oxide-Mg AA chelate (MAGNESIUM, AMINO ACID CHELATE,) 133 mg Take 2 tablets by mouth Two (2) times a day. 120 tablet PRN    melatonin 3  mg Tab Take 1 tablet (3 mg total) by mouth nightly. 30 tablet 0    metoprolol succinate (TOPROL-XL) 25 MG 24 hr tablet Take 1 tablet (25 mg total) by mouth.      mycophenolate (CELLCEPT) 250 mg capsule Take 2 capsules (500 mg total) by mouth two (2) times a day. 120 capsule 11    pantoprazole (PROTONIX) 40 MG tablet Take 1 tablet (40 mg total) by mouth daily.      pen needle, diabetic (PEN NEEDLE) 32 gauge x 5/32 (4 mm) Ndle Use as directed four times daily 100 each 1    predniSONE (DELTASONE) 5 MG tablet Take 4 tablets (20 mg) by mouth for one day, then take 3 tablets (15 mg) the next day, then take 2 tablets (10 mg) the next day, then take 1 tablet (5mg ) then stop 10 tablet 0    pregabalin (LYRICA) 100 MG capsule Take 1 capsule (100 mg total) by mouth 2 (two) times daily 60 capsule 11    pregabalin (LYRICA) 75 MG capsule Take 1 capsule (75 mg total) by mouth Two (2) times a day. 60 capsule 6    rosuvastatin (CRESTOR) 5 MG tablet Take 1 tablet (5 mg total) by mouth daily. 90 tablet 1    sildenafiL, pulm.hypertension, (REVATIO) 20 mg tablet Take 2 tablets (40 mg total) by mouth Three (3) times a day. 180 tablet 6    tamsulosin (FLOMAX) 0.4 mg capsule Take 1 capsule (0.4 mg total) by mouth daily. 90 capsule 0    mirtazapine (REMERON) 15 MG tablet Take 1 tablet (15 mg total) by mouth nightly. 90 tablet 3    pen needle, diabetic (ULTICARE PEN NEEDLE) 32 gauge x 5/32 (4 mm) Ndle USE WITH INSULIN PEN 4 TIMES DAILY AS DIRECTED 100 each 1     No current facility-administered medications for this visit.       PHYSICAL EXAM:     Vitals:    07/08/21 1207   BP: 152/72   Pulse: 61   Temp: 36 ??C (96.8 ??F)       CONSTITUTIONAL: Alert, no distress  HEAD: [place]/AT  EYES: Extra ocular movements intact, sclerae anicteric.  NECK: Supple, no lymphadenopathy  CARDIOVASCULAR: Regular, normal S1/S2 heart sounds, no rubs.   PULM: Clear to auscultation bilaterally  GASTROINTESTINAL: Soft, nontender  EXTREMITIES: No lower extremity edema bilaterally.       MEDICAL DECISION MAKING    Recent Results (from the past 340 hour(s))   POCT Urinalysis Dipstick    Collection Time: 07/08/21 12:19 PM   Result Value Ref Range    Spec Gravity/POC 1.025 1.003 - 1.030    PH/POC 5.5 5.0 - 9.0    Leuk Esterase/POC Negative Negative    Nitrite/POC Negative Negative    Protein/POC 1+ (A) Negative    UA Glucose/POC Negative Negative    Ketones, POC Negative Negative    Bilirubin/POC Negative Negative    Blood/POC Negative Negative    Urobilinogen/POC 0.2 0.2 - 1.0 mg/dL   Albumin / creatinine urine ratio    Collection Time: 07/08/21 12:52 PM   Result Value Ref Range    Creat U 150.4 Undefined mg/dL    Albumin Quantitative, Urine 1.7 Undefined mg/dL    Albumin/Creatinine Ratio 11.3 0.0 - 30.0 ug/mg     Lab Results Component Value Date    WBC 4.8 05/17/2021    HGB 12.1 (L) 05/17/2021    HCT 35.4 (L) 05/17/2021    PLT 117 (L) 05/17/2021  Lab Results   Component Value Date    NA 139 05/17/2021    K 4.8 05/17/2021    CL 108 (H) 05/17/2021    CO2 22.0 05/17/2021    BUN 37 (H) 05/17/2021    CREATININE 1.93 (H) 05/17/2021    GLU 244 (H) 05/17/2021    CALCIUM 9.8 05/17/2021    MG 1.6 05/17/2021    PHOS 3.8 05/17/2021       Lab Results   Component Value Date    BILITOT 0.6 05/17/2021    BILIDIR 0.20 05/17/2021    PROT 6.5 05/17/2021    ALBUMIN 3.6 05/17/2021    ALT 11 05/17/2021    AST 12 05/17/2021    ALKPHOS 75 05/17/2021    GGT 27 05/17/2021       Lab Results   Component Value Date    PT 11.6 11/05/2018    INR 1.01 11/05/2018    APTT 30.5 11/05/2018                 BUN   Date Value Ref Range Status   05/17/2021 37 (H) 9 - 23 mg/dL Final   16/12/9602 25 (H) 9 - 23 mg/dL Final   54/11/8117 38 (H) 9 - 23 mg/dL Final   14/78/2956 38 (H) 9 - 23 mg/dL Final   21/30/8657 31 (H) 9 - 23 mg/dL Final   84/69/6295 34 (H) mg/dL Final   28/41/3244 41 (H) mg/dL Final   03/16/7251 30 (H) mg/dL Final   66/44/0347 12 7 - 21 mg/dL Final   42/59/5638 14 7 - 21 mg/dL Final      Phosphorus   Date Value Ref Range Status   05/17/2021 3.8 2.4 - 5.1 mg/dL Final   75/64/3329 2.9 2.4 - 5.1 mg/dL Final   51/88/4166 2.8 2.4 - 5.1 mg/dL Final   09/11/1599 2.8 2.4 - 5.1 mg/dL Final   09/32/3557 2.4 2.4 - 5.1 mg/dL Final      Calcium   Date Value Ref Range Status   05/17/2021 9.8 8.7 - 10.4 mg/dL Final   32/20/2542 9.6 8.7 - 10.4 mg/dL Final   70/62/3762 9.7 8.7 - 10.4 mg/dL Final   83/15/1761 60.7 8.7 - 10.4 mg/dL Final   37/12/6267 48.5 8.7 - 10.4 mg/dL Final   46/27/0350 9.3 8.5 - 10.2 mg/dL Final   09/38/1829 9.6 8.5 - 10.2 mg/dL Final   93/71/6967 9.5 8.5 - 10.2 mg/dL Final   89/38/1017 8.6 8.5 - 10.2 MG/DL Final   51/04/5850 8.7 8.5 - 10.2 MG/DL Final      PTH   Date Value Ref Range Status   06/14/2019 95.0 (H) 12.0 - 72.0 pg/mL Final           CO2 Date Value Ref Range Status   05/17/2021 22.0 20.0 - 31.0 mmol/L Final   04/06/2021 21.4 20.0 - 31.0 mmol/L Final   12/01/2020 25.1 20.0 - 31.0 mmol/L Final   04/27/2020 23.1 20.0 - 31.0 mmol/L Final   04/06/2020 22.0 20.0 - 31.0 mmol/L Final   10/10/2013 29 22 - 30 mmol/L Final   04/03/2013 28 22 - 30 mmol/L Final   07/25/2012 30 22 - 30 mmol/L Final   05/13/2010 30 22 - 30 MMOL/L Final         ASSESSMENT/PLAN:  Mr.Saunders Zoll is a/an 71 y.o. 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     1. Chronic Kidney Disease Stage G3b:  Most likely due to diabetic nephropathy and prior fluid shifts in the post-transplant requiring CRRT to manage pulmonary hypertension/fluid overload. Most recent creatinine within baseline 1.6-2.  UACR unremarkable.  We discussed the importance of DM control in slowing CKD progression and preventing further complications, Continue to avoid nephrotoxins. .    2. Type 2 Diabetes:  poorly controlled, has not followed up with [facility] endocrinology. Will rerefer  3. S/p Liver Transplant: Liver enzymes within normal limits   4. Hypertension: today BP above goal, monitor and if continue above target modification of regimen   5. Urological; as above recently saw urology who also discussed DM control. 04/06/2020 22.0 20.0 - 31.0 mmol/L Final   10/10/2013 29 22 - 30 mmol/L Final   04/03/2013 28 22 - 30 mmol/L Final   07/25/2012 30 22 - 30 mmol/L Final   05/13/2010 30 22 - 30 MMOL/L Final         I have reviewed relevant outside healthcare records    Urine Sediment: Bland sediment, no formed cellular elements    IMAGING STUDIES:   04/2018 MR Abdomen with and without contrast:  LOWER CHEST:   The visualized portions of the lower chest are unremarkable.  ABDOMEN/PELVIS:  HEPATOBILIARY:  Patient is status-post liver transplantation. Several peripherally enhancing lesions are again seen within hepatic segments II and VIII, which are stable-to-slightly decreased in size. For reference:  - Slightly decreased size of largest hepatic segment VIII lesion (13:24) which measures 1.7 x 2.1 cm (transverse by AP). This lesion previously measured 2.3 x 2.1 cm.  No new focal hepatic lesions are identified. Gallbladder surgically absent. Mild intrahepatic biliary ductal dilatation is noted, which is improved from prior exam and thought secondary to postcholecystectomy state. No obstructing stone or mass lesion is identified.  PANCREAS: Pancreas is atrophic. Multiple nonenhancing cystic lesions are seen scattered throughout the pancreatic head and body, similar to prior. No new focal mass. No ductal dilatation.  SPLEEN: No focal parenchymal abnormality.  ADRENAL GLANDS: Unremarkable.  KIDNEYS/URETERS: Symmetric nephrograms. No enhancing renal mass. No hydronephrosis.  BOWEL/PERITONEUM/RETROPERITONEUM: No bowel obstruction. No acute inflammatory process. No free fluid.  VASCULATURE: Abdominal aorta within normal limits for patient's age. Unremarkable IVC. The portal, splenic, and superior mesenteric veins are patent.   LYMPH NODES: Previously described enlarged porta hepatis lymph node is no longer well-visualized. No new or enlarging lymphadenopathy is identified within the abdomen.  BONES/SOFT TISSUES: Posterior spinal fixation hardware with associated artifact somewhat limits evaluation of the adjacent osseous structures and soft tissues.   Gynecomastia. No soft tissue abnormality. No worrisome osseous lesion identified.  IMPRESSION:  - Unchanged to slightly decreased size of hepatic abscesses in segments 2 and 8. No new hepatic lesions identified.  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    ASSESSMENT/PLAN:  [name] is a/an 71 y.o. 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   1. Chronic Kidney Disease Stage III: The patient's renal dysfunction is most likely secondary to diabetic nephropathy and prior fluid shifts in the post-transplant requiring CRRT to manage pulmonary hypertension/fluid overload. His creatinine has ranged from 1.6-2 over the past year, most recently 1.8 from 01/20/21.  Potassium 5.0, bicarbonate 25 - decreased sodium bicarbonate to daily instead of BID.  UACR unremarkable.  The patient was counseled  on avoiding nephrotoxic agents including but not limited to NSAIDs and contrasted studies.    2. Type 2 Diabetes: Most recent A1c 9.4, managed by PCP and Endocrinology.   3. S/p Liver Transplant: Liver enzymes within normal limits, platelets stable at 102.    4. Atrial Fibrillation: Rate and rhythm controlled.   5. Hypertension: Well controlled, continue current regimen.   6. Urinary Frequency/Nocturia: Will trial Flomax 0.4mg  daily.  Discussed risks/benefits of this medication and provided patient with educational handout regarding this medication with his AVS for review.      > 50% of this 30 minute visit was spent in explaining diagnoses, answering questions, presenting treatment options and discussing the treatment plan as described above.     Mr.Shady Kauer will follow up in 6 months

## 2021-07-19 NOTE — Unmapped (Signed)
[facility] Clinical Assessment & Refill Coordination Note    Jeffrey Ward, DOB: [birth year]  Phone: [phone] (home)     All above HIPAA information was verified with patient.     Was a Nurse, learning disability used for this call? No    Specialty Medication(s):   Transplant: mycophenolate mofetil 250mg , cyclosporine 25mg , and cyclosporine 100mg      Current Outpatient Medications   Medication Sig Dispense Refill    amiodarone (PACERONE) 200 MG tablet Take by mouth.      BASAGLAR KWIKPEN U-100 INSULIN 100 unit/mL (3 mL) injection pen Inject 14 Units subcutaneously at bedtime 15 mL 4    bisacodyL (DULCOLAX) 5 mg EC tablet Take 1 tablet (5 mg total) by mouth daily as needed.      blood-glucose meter (GLUCOSE MONITORING KIT) kit Use as instructed 1 each 0    cholecalciferol, vitamin D3, 125 mcg (5,000 unit) tablet Take 1 tablet (125 mcg total) by mouth daily.      cycloSPORINE modified (NEORAL) 100 MG capsule Take 2 capsules (200 mg total) by mouth daily AND 1 capsule (100 mg total) nightly. 270 capsule 3    cycloSPORINE modified (NEORAL) 25 MG capsule Take 3 capsules (75 mg total) by mouth nightly (with a 100mg  capsule). For a total dose of 200 mg in the morning , 175mg  every evening 270 capsule 3    DULoxetine (CYMBALTA) 30 MG capsule Take 2 capsules (60 mg total) by mouth nightly. 60 capsule 5    magnesium oxide-Mg AA chelate (MAGNESIUM, AMINO ACID CHELATE,) 133 mg Take 2 tablets by mouth Two (2) times a day. 120 tablet PRN    melatonin 3 mg Tab Take 1 tablet (3 mg total) by mouth nightly. 30 tablet 0    metoprolol succinate (TOPROL-XL) 25 MG 24 hr tablet Take 1 tablet (25 mg total) by mouth.      mirtazapine (REMERON) 15 MG tablet Take 1 tablet (15 mg total) by mouth nightly. 90 tablet 3    mycophenolate (CELLCEPT) 250 mg capsule Take 2 capsules (500 mg total) by mouth two (2) times a day. 120 capsule 11    pantoprazole (PROTONIX) 40 MG tablet Take 1 tablet (40 mg total) by mouth daily. pen needle, diabetic (PEN NEEDLE) 32 gauge x 5/32 (4 mm) Ndle Use as directed four times daily 100 each 1    pen needle, diabetic (ULTICARE PEN NEEDLE) 32 gauge x 5/32 (4 mm) Ndle USE WITH INSULIN PEN 4 TIMES DAILY AS DIRECTED 100 each 1    predniSONE (DELTASONE) 5 MG tablet Take 4 tablets (20 mg) by mouth for one day, then take 3 tablets (15 mg) the next day, then take 2 tablets (10 mg) the next day, then take 1 tablet (5mg ) then stop 10 tablet 0    pregabalin (LYRICA) 100 MG capsule Take 1 capsule (100 mg total) by mouth 2 (two) times daily 60 capsule 11    pregabalin (LYRICA) 75 MG capsule Take 1 capsule (75 mg total) by mouth Two (2) times a day. 60 capsule 6    rosuvastatin (CRESTOR) 5 MG tablet Take 1 tablet (5 mg total) by mouth daily. 90 tablet 1    sildenafiL, pulm.hypertension, (REVATIO) 20 mg tablet Take 2 tablets (40 mg total) by mouth Three (3) times a day. 180 tablet 6    tamsulosin (FLOMAX) 0.4 mg capsule Take 1 capsule (0.4 mg total) by mouth daily. 90 capsule 0     No current facility-administered medications for  this visit.        Changes to medications: [place] reports no changes at this time.    No Known Allergies    Changes to allergies: No    SPECIALTY MEDICATION ADHERENCE     Cyclosporine 25 mg: 8 days of medicine on hand   Cyclosporine 100 mg: 8 days of medicine on hand   Mycophenolate 250 mg: 8 days of medicine on hand     Medication Adherence    Patient reported X missed doses in the last month: 0  Specialty Medication: Cyclosporine 25mg   Patient is on additional specialty medications: Yes  Additional Specialty Medications: Cyclosporine 100mg   Patient Reported Additional Medication X Missed Doses in the Last Month: 0  Patient is on more than two specialty medications: Yes  Specialty Medication: mycophenolate 250mg   Patient Reported Additional Medication X Missed Doses in the Last Month: 0  Support network for adherence: family member          Specialty medication(s) dose(s) confirmed: Regimen is correct and unchanged.     Are there any concerns with adherence? No    Adherence counseling provided? Not needed    CLINICAL MANAGEMENT AND INTERVENTION      Clinical Benefit Assessment:    Do you feel the medicine is effective or helping your condition? Yes    Clinical Benefit counseling provided? Not needed    Adverse Effects Assessment:    Are you experiencing any side effects? No    Are you experiencing difficulty administering your medicine? No    Quality of Life Assessment:           How many days over the past month did your liver transplant  keep you from your normal activities? For example, brushing your teeth or getting up in the morning. 0    Have you discussed this with your provider? Not needed    Acute Infection Status:    Acute infections noted within Epic:  No active infections  Patient reported infection: None    Therapy Appropriateness:    Is therapy appropriate and patient progressing towards therapeutic goals? Yes, therapy is appropriate and should be continued    DISEASE/MEDICATION-SPECIFIC INFORMATION      N/A    PATIENT SPECIFIC NEEDS     Does the patient have any physical, cognitive, or cultural barriers? No    Is the patient high risk? Yes, patient is taking a REMS drug. Medication is dispensed in compliance with REMS program    Does the patient require a Care Management Plan? No     SOCIAL DETERMINANTS OF HEALTH     At the [facility], we have learned that life circumstances - like trouble affording food, housing, utilities, or transportation can affect the health of many of our patients.   That is why we wanted to ask: are you currently experiencing any life circumstances that are negatively impacting your health and/or quality of life? No    Social Determinants of Psychologist, prison and probation services Strain: Not on file   Internet Connectivity: Not on file   Food Insecurity: Not on file   Tobacco Use: Low Risk     Smoking Tobacco Use: Never    Smokeless Tobacco Use: Never Passive Exposure: Not on file   Housing/Utilities: Not on file   Alcohol Use: Not on file   Transportation Needs: Not on file   Substance Use: Not on file   Health Literacy: Not on file   Physical Activity: Not  on file   Interpersonal Safety: Not on file   Stress: Not on file   Intimate Partner Violence: Not on file   Depression: Not on file   Social Connections: Not on file       Would you be willing to receive help with any of the needs that you have identified today? Not applicable       SHIPPING     Specialty Medication(s) to be Shipped:   Transplant: mycophenolate mofetil 250mg , cyclosporine 100mg , and cyclosporine 25mg     Other medication(s) to be shipped:  duloxetine, mg, pregablin, sildenafil, pen needles     Changes to insurance: No    Delivery Scheduled: Yes, Expected medication delivery date: 07/26/21.     Medication will be delivered via Same Day Courier to the confirmed prescription address in [facility].    The patient will receive a drug information handout for each medication shipped and additional FDA Medication Guides as required.  Verified that patient has previously received a Conservation officer, historic buildings and a Surveyor, mining.    The patient or caregiver noted above participated in the development of this care plan and knows that they can request review of or adjustments to the care plan at any time.      All of the patient's questions and concerns have been addressed.    Tera Helper   [facility] Specialty Pharmacist

## 2021-07-26 MED FILL — SILDENAFIL (PULMONARY HYPERTENSION) 20 MG TABLET: ORAL | 30 days supply | Qty: 180 | Fill #6

## 2021-07-26 MED FILL — MG-PLUS-PROTEIN 133 MG TABLET: ORAL | 30 days supply | Qty: 120 | Fill #4

## 2021-07-26 MED FILL — CYCLOSPORINE MODIFIED 100 MG CAPSULE: ORAL | 30 days supply | Qty: 90 | Fill #3

## 2021-07-26 MED FILL — MYCOPHENOLATE MOFETIL 250 MG CAPSULE: ORAL | 30 days supply | Qty: 120 | Fill #6

## 2021-07-26 MED FILL — CYCLOSPORINE MODIFIED 25 MG CAPSULE: ORAL | 30 days supply | Qty: 90 | Fill #3

## 2021-07-26 MED FILL — ULTICARE PEN NEEDLE 32 GAUGE X 5/32" (4 MM): 25 days supply | Qty: 100 | Fill #1

## 2021-07-26 MED FILL — DULOXETINE 30 MG CAPSULE,DELAYED RELEASE: ORAL | 30 days supply | Qty: 60 | Fill #2

## 2021-07-26 MED FILL — PREGABALIN 100 MG CAPSULE: 30 days supply | Qty: 60 | Fill #10

## 2021-08-06 ENCOUNTER — Ambulatory Visit: Admit: 2021-08-06 | Discharge: 2021-08-07 | Payer: MEDICARE

## 2021-08-06 DIAGNOSIS — I2721 Secondary pulmonary arterial hypertension: Principal | ICD-10-CM

## 2021-08-06 DIAGNOSIS — Z944 Liver transplant status: Principal | ICD-10-CM

## 2021-08-06 DIAGNOSIS — R0600 Dyspnea, unspecified: Principal | ICD-10-CM

## 2021-08-06 DIAGNOSIS — K766 Portal hypertension: Principal | ICD-10-CM

## 2021-08-06 NOTE — Unmapped (Signed)
[facility]   Pulmonary Hypertension Program                                             H. Sherre Lain, MD--Director (Pulmonary)     Denice Bors. Tresa Res, MD--Associate Director (Pulmonary)    Freeman Caldron, MD (Cardiology)     Bonney Leitz, MD (Pulmonary)    Marva Panda, RN Nurse Coordinator     Claretha Cooper, RN Nurse Coordinator    [phone] office - [phone] fax      INITIAL HPI:    [name]  is a 71 y.o. male  patient referred by Jeffrey Booze, MD for work up and evaluation for pulmonary hypertension.    PAH Treatment History:  Sildenafil 40 mg tid since post liver txplt hospitalization.    Pt describes a history of recent liver txplt in 09/2016.  Post op was noted to have previously unrecognized PAH and RV failure, treated initially with inhaled iloprost while on vent support and then transitioned to sildenafil.  He had a long post op course requiring a stay in rehab of about 2 weeks prior to discharge.   His post op course was further complicated by AKI (on CRRT then had renal recovery), afib, bradycardia requiring pacemaker placement.  CTA negative for PE post op.      No LE edema currently.      He continues on sildenafil 40 tid and is receiving from [facility].      He is no longer on warfarin.    No syncope or near syncope.      Denies dyspnea, syncope or near syncope.      Had recent hip fx s/p surgery end of Sept 2019.  Currently undergoing rehab.      Has been having URI symptoms last two day.  Antibx was called in by his PCP.  No fevers.      INTERVAL HPI 11/19/19:    Patient reports doing well.  No changes since last visit aside from feeling a little more dyspneic with activities.  He has gained weight and thinks may be related to that.    No syncope or near syncope.  No LE edema noted.    Continues on sildenafil.      Patient refuses to get COVID vaccine.      INTERVAL HPI 07/31/20:    Patient continues to feel weakness and some dypsnea, but not changed compared to prior visit.    No LE edema noted.  No chest pain.     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    No syncope or near syncope.     INTERVAL HPI 12/01/20:    Patient here for follow up. Continues with same level of dyspnea he has had since liver tsplt.      No LE edema.     Denies syncope.      Continues on sildenafil for POPH.    Is overdue for echo--states he was not called to schedule after it was ordered at last visit.    INTERVAL HPI 04/06/21:    States he is doing well in terms of exertional capacity.  No syncope or near syncope.  No LE edema noted.    Asks for urology referral due to concern for BPH.  Continues on sildenafil with no issues for POPH.      Overdue for follow up with hepatology.      INTERVAL HPI 08/06/21:    Patient here for follow up.      No new issues.  Denies syncope or LE edema.    Remains on sildenafil for POPH.    He has followed up with hepatology since last visit.      No changes in exertional capacity noted by patient.        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      The patient's current NYHA functional class is Class 2 - comfortable at rest but have symptoms with ordinary physcial activity.     Will get follow up echo as this is overdue.  He was not called to schedule.      Overall he appears stable with no signs of RV failure clinically.      Plan:          1) continue sildenafil 40 mg tid.      2) next visit in 4 months    3) pro BNP and BMP at next visit    4) echo in coming weeks-gave patient number for scheduling      Subjective:      Past medical history is significant for:   Past Medical History:   Diagnosis Date    Atrial fibrillation (CMS-HCC)     Basal cell carcinoma     Cancer (CMS-HCC)     Cirrhosis (CMS-HCC)     Depression     Diabetes (CMS-HCC)     Headache     Hepatitis C 07/17/2012 History of transfusion     Liver disease     Low back pain 07/17/2012    Varices, esophageal (CMS-HCC)       Family History   Problem Relation Age of Onset    Hypertension Mother     Cancer Mother     Heart disease Mother     Heart disease Father     Cirrhosis Neg Hx     Liver cancer Neg Hx     Anemia Neg Hx     Diabetes Neg Hx     Kidney disease Neg Hx     Obesity Neg Hx     Thyroid disease Neg Hx     Osteoporosis Neg Hx     Coronary artery disease Neg Hx     Anesthesia problems Neg Hx     Basal cell carcinoma Neg Hx     Squamous cell carcinoma Neg Hx       Social History     Socioeconomic History    Marital status: Widowed   Tobacco Use    Smoking status: Never    Smokeless tobacco: Never    Tobacco comments:     Smoked in high school for about 5 years.    Substance and Sexual Activity    Alcohol use: No    Drug use: No    Sexual activity: Yes     Partners: Female     Birth control/protection: None   Other Topics Concern    Exercise No    Living Situation No      Past Surgical History:   Procedure Laterality Date    ANKLE SURGERY      BACK SURGERY      BACK SURGERY  CARDIAC SURGERY      pacemaker    CHG US GUIDE, TISSUE ABLATION N/A 01/22/2016    Procedure: ULTRASOUND GUIDANCE FOR, AND MONITORING OF, PARENCHYMAL TISSUE ABLATION;  Surgeon: Jeffrey Nearing, MD;  Location: MAIN OR [facility];  Service: Transplant    IR EMBOLIZATION ORGAN ISCHEMIA, TUMORS, INFAR  06/16/2016    IR EMBOLIZATION ORGAN ISCHEMIA, TUMORS, INFAR 06/16/2016 Jeffrey Dalton, MD IMG VIR [facility]    PR COLON CA SCRN NOT HI RSK IND N/A 02/27/2015    Procedure: COLOREC CNCR SCR;COLNSCPY NO;  Surgeon: Jeffrey Antigua, MD;  Location: [facility];  Service: Gastroenterology    PR ENDOSCOPIC ULTRASOUND EXAM N/A 02/27/2015    Procedure: UGI ENDO; W/ENDO ULTRASOUND EXAM INCLUDES ESOPHAGUS, STOMACH, &/OR DUODENUM/JEJUNUM;  Surgeon: Jeffrey Antigua, MD;  Location: GI PROCEDURES [facility];  Service: Gastroenterology    PR ERCP BALLOON DILATE BILIARY/PANC DUCT/AMPULLA EA N/A 02/10/2017    Procedure: ERCP;WITH TRANS-ENDOSCOPIC BALLOON DILATION OF BILIARY/PANCREATIC DUCT(S) OR OF AMPULLA, INCLUDING SPHINCTERECTOMY, WHEN PERFOREMD,EACH DUCT (16109);  Surgeon: Mayford Knife, MD;  Location: GI PROCEDURES [facility];  Service: Gastroenterology    PR ERCP REMOVE FOREIGN BODY/STENT BILIARY/PANC DUCT N/A 02/10/2017    Procedure: ENDOSCOPIC RETROGRADE CHOLANGIOPANCREATOGRAPHY (ERCP); W/ REMOVAL OF FOREIGN BODY/STENT FROM BILIARY/PANCREATIC DUCT(S);  Surgeon: Mayford Knife, MD;  Location: GI PROCEDURES [facility]IAL [facility];  Service: Gastroenterology    PR ERCP REMOVE FOREIGN BODY/STENT BILIARY/PANC DUCT N/A 06/29/2017    Procedure: ENDOSCOPIC RETROGRADE CHOLANGIOPANCREATOGRAPHY (ERCP); W/ REMOVAL OF FOREIGN BODY/STENT FROM BILIARY/PANCREATIC DUCT(S);  Surgeon: Jeffrey Antigua, MD;  Location: GI PROCEDURES [facility];  Service: Gastroenterology    PR ERCP STENT PLACEMENT BILIARY/PANCREATIC DUCT N/A 11/29/2016    Procedure: ENDOSCOPIC RETROGRADE CHOLANGIOPANCREATOGRAPHY (ERCP); WITH PLACEMENT OF ENDOSCOPIC STENT INTO BILIARY OR PANCREATIC DUCT;  Surgeon: Chriss Driver, MD;  Location: GI PROCEDURES [facility];  Service: Gastroenterology    PR ERCP STENT PLACEMENT BILIARY/PANCREATIC DUCT N/A 04/26/2017    Procedure: ENDOSCOPIC RETROGRADE CHOLANGIOPANCREATOGRAPHY (ERCP); WITH PLACEMENT OF ENDOSCOPIC STENT INTO BILIARY OR PANCREATIC DUCT;  Surgeon: Mayford Knife, MD;  Location: GI PROCEDURES [facility];  Service: Gastroenterology    PR ERCP,W/REMOVAL STONE,BIL/PANCR DUCTS N/A 11/29/2016    Procedure: ERCP; W/ENDOSCOPIC RETROGRADE REMOVAL OF CALCULUS/CALCULI FROM BILIARY &/OR PANCREATIC DUCTS;  Surgeon: Chriss Driver, MD;  Location: GI PROCEDURES [facility];  Service: Gastroenterology    PR ERCP,W/REMOVAL STONE,BIL/PANCR DUCTS N/A 02/10/2017    Procedure: ERCP; W/ENDOSCOPIC RETROGRADE REMOVAL OF CALCULUS/CALCULI FROM BILIARY &/OR PANCREATIC DUCTS;  Surgeon: Mayford Knife, MD;  Location: GI PROCEDURES [facility];  Service: Gastroenterology    PR ERCP,W/REMOVAL STONE,BIL/PANCR DUCTS N/A 04/26/2017    Procedure: ERCP; W/ENDOSCOPIC RETROGRADE REMOVAL OF CALCULUS/CALCULI FROM BILIARY &/OR PANCREATIC DUCTS;  Surgeon: Mayford Knife, MD;  Location: GI PROCEDURES [facility];  Service: Gastroenterology    PR ERCP,W/REMOVAL STONE,BIL/PANCR DUCTS N/A 06/29/2017    Procedure: ERCP; W/ENDOSCOPIC RETROGRADE REMOVAL OF CALCULUS/CALCULI FROM BILIARY &/OR PANCREATIC DUCTS;  Surgeon: Jeffrey Antigua, MD;  Location: [facility];  Service: Gastroenterology    PR ESOPHAGOSCP RIG TRANSORAL HYPOPHARYNX CRV ESOPH N/A 04/04/2018    Procedure: ESOPHAGOSCOPY, RIGID, TRANSORAL WITH DIVERTICULECTOMY OF HYPOPHARYNX OR CERVICAL ESOPHAGUS, WITH CRICOPHARYNGEAL MYOTOMY, INCLUDES USE OF TELESCOPE OR OPERATING MICROSCOPE AND REPAIR, WHEN PERFORMED;  Surgeon: Vista Deck, MD;  Location: MAIN OR [facility];  Service: ENT    PR INSER HEART TEMP PACER ONE CHMBR N/A 10/02/2016    Procedure: Tempoarary Pacemaker Insertion;  Surgeon: Erling Conte  Buren Kos, MD;  Location: [facility];  Service: Cardiology    PR LAP,DIAGNOSTIC ABDOMEN N/A 01/22/2016    Procedure: Laparoscopy, Abdomen, Peritoneum, & Omentum, Diagnostic, W/Wo Collection Specimen(S) By Brushing Or Washing;  Surgeon: Jeffrey Nearing, MD;  Location: MAIN OR [facility];  Service: Transplant    PR LARYNGOPLASTY MEDIALIZATION Neysa Bonito N/A 11/07/2018    Procedure: R31 LARYNGOPLASTY, MEDIALIZATION, UNILATERAL;  Surgeon: Vista Deck, MD;  Location: MAIN OR [facility];  Service: ENT    PR PLACE PERCUT GASTROSTOMY TUBE N/A 11/17/2016    Procedure: UGI ENDO; W/DIRECTED PLCMT PERQ GASTROSTOMY TUBE;  Surgeon: Cletis Athens, MD;  Location: GI PROCEDURES [facility];  Service: Gastroenterology    PR TRACHEOSTOMY, PLANNED N/A 09/29/2016    Procedure: TRACHEOSTOMY PLANNED (SEPART PROC);  Surgeon: Katherina Mires, MD;  Location: MAIN OR [facility];  Service: Trauma    PR TRANSCATH INSERT OR REPLACE LEADLESS PM VENTR N/A 10/11/2016    Procedure: Pacemaker Implant/Replace Leadless;  Surgeon: Meredith Leeds, MD;  Location: [facility];  Service: Cardiology    PR TRANSPLANT LIVER,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    Procedure: Liver Allotransplantation; Orthotopic, Partial Or Whole, From Cadaver Or Living Donor, Any Age;  Surgeon: Doyce Loose, MD;  Location: MAIN OR [facility];  Service: Transplant    PR TRANSPLANT,PREP DONOR LIVER, WHOLE N/A 09/15/2016    Procedure: Rogelia Boga Std Prep Cad Donor Whole Liver Gft Prior Tnsplnt,Inc Chole,Diss/Rem Surr Tissu Wo Triseg/Lobe Splt;  Surgeon: Doyce Loose, MD;  Location: MAIN OR [facility];  Service: Transplant    PR UPPER GI ENDOSCOPY,BIOPSY N/A 07/17/2012    Procedure: UGI ENDOSCOPY; WITH BIOPSY, SINGLE OR MULTIPLE;  Surgeon: Jeffrey Destine, MD;  Location: GI PROCEDURES [facility];  Service: Gastroenterology    PR UPPER GI ENDOSCOPY,DIAGNOSIS N/A 02/04/2014    Procedure: UGI ENDO, INCLUDE ESOPHAGUS, STOMACH, & DUODENUM &/OR JEJUNUM; DX W/WO COLLECTION SPECIMN, BY BRUSH OR WASH;  Surgeon: Wilburt Finlay, MD;  Location: GI PROCEDURES [facility];  Service: Gastroenterology    PR UPPER GI ENDOSCOPY,LIGAT VARIX N/A 11/05/2013    Procedure: UGI ENDO; Everlene Balls LIG ESOPH &/OR GASTRIC VARICES;  Surgeon: Wilburt Finlay, MD;  Location: GI PROCEDURES [facility];  Service: Gastroenterology        Objective:   Objective   Review of systems is significant for:     As stated in the HPI, remainder of 12 system review is negative.      No Known Allergies   Current Outpatient Medications   Medication Sig Dispense Refill    amiodarone (PACERONE) 200 MG tablet Take by mouth.      BASAGLAR KWIKPEN U-100 INSULIN 100 unit/mL (3 mL) injection pen Inject 14 Units subcutaneously at bedtime 15 mL 4    bisacodyL (DULCOLAX) 5 mg EC tablet Take 1 tablet (5 mg total) by mouth daily as needed. blood-glucose meter (GLUCOSE MONITORING KIT) kit Use as instructed 1 each 0    cholecalciferol, vitamin D3, 125 mcg (5,000 unit) tablet Take 1 tablet (125 mcg total) by mouth daily.      cycloSPORINE modified (NEORAL) 100 MG capsule Take 2 capsules (200 mg total) by mouth daily AND 1 capsule (100 mg total) nightly. 270 capsule 3    cycloSPORINE modified (NEORAL) 25 MG capsule Take 3 capsules (75 mg total) by mouth nightly (with a 100mg  capsule). For a total dose of 200 mg in the morning , 175mg  every evening 270 capsule 3    DULoxetine (CYMBALTA) 30 MG capsule Take 2 capsules (  60 mg total) by mouth nightly. 60 capsule 5    magnesium oxide-Mg AA chelate (MAGNESIUM, AMINO ACID CHELATE,) 133 mg Take 2 tablets by mouth Two (2) times a day. 120 tablet PRN    melatonin 3 mg Tab Take 1 tablet (3 mg total) by mouth nightly. 30 tablet 0    metoprolol succinate (TOPROL-XL) 25 MG 24 hr tablet Take 1 tablet (25 mg total) by mouth.      mycophenolate (CELLCEPT) 250 mg capsule Take 2 capsules (500 mg total) by mouth two (2) times a day. 120 capsule 11    pantoprazole (PROTONIX) 40 MG tablet Take 1 tablet (40 mg total) by mouth daily.      pen needle, diabetic (PEN NEEDLE) 32 gauge x 5/32 (4 mm) Ndle Use as directed four times daily 100 each 1    predniSONE (DELTASONE) 5 MG tablet Take 4 tablets (20 mg) by mouth for one day, then take 3 tablets (15 mg) the next day, then take 2 tablets (10 mg) the next day, then take 1 tablet (5mg ) then stop 10 tablet 0    pregabalin (LYRICA) 100 MG capsule Take 1 capsule (100 mg total) by mouth 2 (two) times daily 60 capsule 11    pregabalin (LYRICA) 75 MG capsule Take 1 capsule (75 mg total) by mouth Two (2) times a day. 60 capsule 6    rosuvastatin (CRESTOR) 5 MG tablet Take 1 tablet (5 mg total) by mouth daily. 90 tablet 1    sildenafiL, pulm.hypertension, (REVATIO) 20 mg tablet Take 2 tablets (40 mg total) by mouth Three (3) times a day. 180 tablet 6    tamsulosin (FLOMAX) 0.4 mg capsule Take 1 capsule (0.4 mg total) by mouth daily. 90 capsule 0    mirtazapine (REMERON) 15 MG tablet Take 1 tablet (15 mg total) by mouth nightly. 90 tablet 3    pen needle, diabetic (ULTICARE PEN NEEDLE) 32 gauge x 5/32 (4 mm) Ndle USE WITH INSULIN PEN 4 TIMES DAILY AS DIRECTED 100 each 1     No current facility-administered medications for this visit.        Physical Exam:    weight is 67 kg (147 lb 12.8 oz). His temporal temperature is 36.7 ??C (98 ??F). His blood pressure is 157/82 and his pulse is 56. His oxygen saturation is 99%.   General Appearance:    Alert, cooperative, no distress, appears stated age   Head:    Normocephalic, without obvious abnormality, atraumatic   Eyes:    PERRL, conjunctiva clear, EOM's intact    Oropharynx:   No lesions   Neck:   Supple, trachea midline, no adenopathy;    No JVD   Lungs:     Good breath sounds b/l, no crackles or wheezes, respirations unlabored   Chest Wall:    No tenderness or deformity    Heart:    Regular rate and rhythm, S1 and S2 normal, no murmur, rub   or gallop   Abdomen:     Soft, non-tender, normal bowel sounds, no masses, no organomegaly.  + G tube in place   Extremities:   Extremities normal, no cyanosis, clubbing, or edema   Pulses:   2+ and symmetric all extremities   Skin:   No rashes or lesions   Lymph nodes:   No cervical or supraclavicular adenopathy   Neurologic:   No focal neurological deficits       Diagnostic Review:    Echo 04/21/17:  Left Ventricle The left ventricle is normal in size with normal wall thickness.   The left ventricular systolic function is normal.   The ejection fraction is visually estimated at 65 to 70%.   There is impaired left ventricular relaxation with normal left ventricular filling pressure consistent with grade I diastolic dysfunction.   Mitral Valve The mitral valve leaflets are normal, with normal leaflet mobility.   There is no significant mitral regurgitation by color and continuous wave Doppler imaging.   Left Atrium The left atrium is dilated.   Aortic Valve The aortic valve is poorly visualized with probably normal excursion.   There is no significant aortic regurgitation by color and continuous wave Doppler imaging.  There is no evidence of a significant transvalvular gradient.   - Peak transvalvular velocity: 1 m/sec   Ascending Aorta The aorta is normal in size in the visualized segments.   Pulmonary Artery The pulmonary artery is not well visualized.   Pulmonic Valve The pulmonic valve is poorly visualized, but probably normal.   There is no significant pulmonic regurgitation by color and continuous wave Doppler imaging.   There is no evidence of a significant transvalvular gradient.   - Peak transvalvular velocity: 0.9 m/sec   Right Ventricle The right ventricle is normal in size with normal wall thickness.   The right ventricular systolic function is normal.   Tricuspid Valve The tricuspid valve leaflets are normal, with normal leaflet mobility.   There is trivial tricuspid regurgitation by color and continuous wave Doppler imaging.   - The Doppler signal is suboptimal and pulmonary artery systolic pressure cannot be accurately estimated.   Right Atrium The right atrium is normal in size.   IVC/SVC The IVC diameter is <=21 mm with >50% decrease in size with inspiration suggesting normal right atrial pressure (0-5 mm Hg).   Pericardium There is no evidence of a significant pericardial effusion.         V/Q scan 01/11/17:    Impression     Normal ventilation and perfusion scan.         CT scan PE protocol performed on 09/2016 post op revealed:    Impression     No CT evidence for acute pulmonary embolism, as clinically questioned.    Moderate bilateral pleural effusions similar to prior CT dated 01/30/2016 with slightly increased atelectasis of the adjacent lung parenchyma in the bilateral lower lobes, left greater than right.    Addendum (Dr.Altun dictating 09/27/2016 08:35 AM) Focal contour bulging of the liver adjacent to the diaphragm and right kidney is noted which is not well evaluated due to the presence of streak artifacts.          Pulmonary Function Test performed on 11/24/15 show:            Consistent with  normal spirometry and normal DLCO    6 Minute Walk Test performed on 01/06/17:         Echo performed on 09/27/16:    Echocardiographic Findings     Left Ventricle The left ventricle is normal in size with normal wall thickness.   The left ventricular systolic function is normal.   The ejection fraction is visually estimated at 60 to 65%.   Left ventricular diastolic function cannot be accurately assessed.      Left Atrium The left atrium is normal in size.      Right Ventricle The right ventricle is moderately dilated.   The right ventricular systolic function is decreased.  Tricuspid Valve The tricuspid valve is not well visualized.   There is mild tricuspid regurgitation by color and continuous wave Doppler imaging.   The regurgitation jet is directed toward the septum.   The pulmonary artery systolic pressure is severely elevated.   - Peak tricuspid regurgitant velocity: 4.6 m/sec  - Estimated pulmonary artery systolic pressure: 80 + RA pressure mmHg      Right Atrium The right atrium is poorly visualized but appears dilated      IVC/SVC Mechanical ventilation precludes the ability to accurately assess right atrial pressure.      Pericardium There is a small anterior pericardial effusion.          [facility] Catheterization performed on date 09/22/16:     Wedge pressure initially looks to be 15-16 but with further balloon deflation this clearly decreases further to 8 mmHg with very good quality tracing. At that time mPAP 50, CVP 9, CO by CCO measurements 4.1/CI 2.2, PVR approximating 10 WU.

## 2021-08-06 NOTE — Unmapped (Signed)
Thank you for visiting the [facility].    If you have any questions or concerns, please call     H. James Annarose Ouellet, MD    Laura Nowicki, RN  Sara Decker, RN    Pulmonary Hypertension nurse coordinators:    [phone]

## 2021-08-27 MED ORDER — ROSUVASTATIN 5 MG TABLET
ORAL_TABLET | Freq: Every day | ORAL | 3 refills | 90 days | Status: CP
Start: 2021-08-27 — End: ?
  Filled 2021-08-31: qty 90, 90d supply, fill #0

## 2021-08-27 MED ORDER — PEN NEEDLE, DIABETIC 32 GAUGE X 5/32" (4 MM)
1 refills | 0 days
Start: 2021-08-27 — End: ?

## 2021-08-27 MED ORDER — SILDENAFIL (PULMONARY HYPERTENSION) 20 MG TABLET
ORAL_TABLET | Freq: Three times a day (TID) | ORAL | 6 refills | 30 days | Status: CP
Start: 2021-08-27 — End: ?
  Filled 2021-08-31: qty 180, 30d supply, fill #0

## 2021-08-27 NOTE — Unmapped (Signed)
[facility] Refill Coordination Note    Specialty Medication(s) to be Shipped:   Transplant: mycophenolate mofetil 250mg , cyclosporine  mod 25mg mg and cyclosporine mod 100mg mg    Other medication(s) to be shipped: basaglar kwikpen  Duloxetine  Mag with protein  Pen needles  pregabalin  Rosuvastatin  Sildenafil       [facility], DOB: Oct 24, 1950  Phone: [phone] (home)       All above HIPAA information was verified with patient.     Was a Nurse, learning disability used for this call? No    Completed refill call assessment today to schedule patient's medication shipment from the [facility] [phone]).  All relevant notes have been reviewed.     Specialty medication(s) and dose(s) confirmed: Regimen is correct and unchanged.   Changes to medications: [name] reports no changes at this time.  Changes to insurance: No  New side effects reported not previously addressed with a pharmacist or physician: None reported  Questions for the pharmacist: No    Confirmed patient received a Conservation officer, historic buildings and a Surveyor, mining with first shipment. The patient will receive a drug information handout for each medication shipped and additional FDA Medication Guides as required.       DISEASE/MEDICATION-SPECIFIC INFORMATION        N/A    SPECIALTY MEDICATION ADHERENCE     Medication Adherence    Patient reported X missed doses in the last month: 0  Specialty Medication: cycloSPORINE modified 100 MG capsule (NeoraL)  Patient is on additional specialty medications: Yes  Additional Specialty Medications: cycloSPORINE modified 25 MG capsule (NeoraL)  Patient Reported Additional Medication X Missed Doses in the Last Month: 0  Patient is on more than two specialty medications: Yes  Specialty Medication: mycophenolate 250 mg capsule (CELLCEPT)  Patient Reported Additional Medication X Missed Doses in the Last Month: 0  Support network for adherence: family member              Were doses missed due to medication being on hold? No    Mycophenolate 250mg   : 10 days of medicine on hand   Cyclosporine mod 25mg   : 10 days of medicine on hand   Cyclosporine mod 100mg   : 10 days of medicine on hand       REFERRAL TO PHARMACIST     Referral to the pharmacist: Not needed      [facility]     Shipping address confirmed in Epic.     Delivery Scheduled: Yes, Expected medication delivery date: 6/21.  However, Rx request for refills was sent to the provider as there are none remaining.     Medication will be delivered via UPS to the prescription address in Epic WAM.    [name]   [facility] Specialty Technician

## 2021-08-27 NOTE — Unmapped (Signed)
Per the last MD note pt to continue taking crestor 5mg  daily. Refilled #[id number] with 3 refills.

## 2021-08-30 MED ORDER — PEN NEEDLE, DIABETIC 32 GAUGE X 5/32" (4 MM)
1 refills | 0 days
Start: 2021-08-30 — End: ?

## 2021-08-31 MED FILL — MYCOPHENOLATE MOFETIL 250 MG CAPSULE: ORAL | 30 days supply | Qty: 120 | Fill #7

## 2021-08-31 MED FILL — PREGABALIN 100 MG CAPSULE: 30 days supply | Qty: 60 | Fill #11

## 2021-08-31 MED FILL — DULOXETINE 30 MG CAPSULE,DELAYED RELEASE: ORAL | 30 days supply | Qty: 60 | Fill #3

## 2021-08-31 MED FILL — CYCLOSPORINE MODIFIED 100 MG CAPSULE: ORAL | 30 days supply | Qty: 90 | Fill #4

## 2021-08-31 MED FILL — BASAGLAR KWIKPEN U-100 INSULIN 100 UNIT/ML (3 ML) SUBCUTANEOUS: SUBCUTANEOUS | 90 days supply | Qty: 15 | Fill #0

## 2021-08-31 MED FILL — CYCLOSPORINE MODIFIED 25 MG CAPSULE: ORAL | 30 days supply | Qty: 90 | Fill #4

## 2021-08-31 MED FILL — ULTICARE PEN NEEDLE 32 GAUGE X 5/32" (4 MM): 25 days supply | Qty: 100 | Fill #0

## 2021-08-31 MED FILL — MG-PLUS-PROTEIN 133 MG TABLET: ORAL | 30 days supply | Qty: 120 | Fill #5

## 2021-09-23 MED ORDER — PREGABALIN 100 MG CAPSULE
ORAL_CAPSULE | Freq: Two times a day (BID) | ORAL | 5 refills | 30 days | Status: CN
Start: 2021-09-23 — End: ?

## 2021-09-24 MED ORDER — PREGABALIN 100 MG CAPSULE
ORAL_CAPSULE | Freq: Two times a day (BID) | ORAL | 5 refills | 30 days
Start: 2021-09-24 — End: ?

## 2021-10-06 NOTE — Unmapped (Signed)
TRF

## 2021-10-07 NOTE — Unmapped (Signed)
[facility] Refill Coordination Note    Specialty Medication(s) to be Shipped:   Transplant: mycophenolate mofetil 250mg , cyclosporine 25mg , and cyclosporine 100mg     Other medication(s) to be shipped:  duloxetine , mag-ox and pen needles      Jeffrey Ward, DOB: March 14, 1951  Phone: [phone] (home)       All above HIPAA information was verified with patient.     Was a Nurse, learning disability used for this call? No    Completed refill call assessment today to schedule patient's medication shipment from the [facility] [phone]).  All relevant notes have been reviewed.     Specialty medication(s) and dose(s) confirmed: Regimen is correct and unchanged.   Changes to medications: Jeffrey Ward reports no changes at this time.  Changes to insurance: No  New side effects reported not previously addressed with a pharmacist or physician: None reported  Questions for the pharmacist: No    Confirmed patient received a Conservation officer, historic buildings and a Surveyor, mining with first shipment. The patient will receive a drug information handout for each medication shipped and additional FDA Medication Guides as required.       DISEASE/MEDICATION-SPECIFIC INFORMATION        N/A    SPECIALTY MEDICATION ADHERENCE     Medication Adherence    Patient reported X missed doses in the last month: 0  Specialty Medication: cyclosporine 100 mg  Patient is on additional specialty medications: Yes  Additional Specialty Medications: Cyclosporine 25 mg   Patient Reported Additional Medication X Missed Doses in the Last Month: 0  Patient is on more than two specialty medications: Yes  Specialty Medication: mycophenolate 250  Patient Reported Additional Medication X Missed Doses in the Last Month: 0  Support network for adherence: family member              Were doses missed due to medication being on hold? No    mycophenolate 250 mg: 7 days of medicine on hand   cyclosporine 25 mg: 7 days of medicine on hand   Cyclosporine 100 mg: 7 days of medicine on hand       REFERRAL TO PHARMACIST     Referral to the pharmacist: Not needed      [facility]     Shipping address confirmed in Epic.     Delivery Scheduled: Yes, Expected medication delivery date: 10/12/21.     Medication will be delivered via Next Day Courier to the prescription address in Epic WAM.    Quintella Reichert   [facility] Specialty Technician

## 2021-10-07 NOTE — Unmapped (Signed)
The [facility] has made a second and final attempt to reach this patient to refill the following medication:     cycloSPORINE modified 100 MG capsule (NeoraL)  cycloSPORINE modified 25 MG capsule (NeoraL)    mycophenolate 250 mg capsule (CELLCEPT)    We have been unable to leave messages on the following phone numbers: [phone] .    Dates contacted: 09/28/21  10/07/21  Last scheduled delivery:  08/31/21    The patient may be at risk of non-compliance with this medication. The patient should call the [facility] at [phone]  Option 4, then Option 2 (all other specialty patients) to refill medication.    [place] A Montez Stryker   [facility] Specialty Technician

## 2021-10-11 MED FILL — CYCLOSPORINE MODIFIED 100 MG CAPSULE: ORAL | 30 days supply | Qty: 90 | Fill #5

## 2021-10-11 MED FILL — ULTICARE PEN NEEDLE 32 GAUGE X 5/32" (4 MM): 25 days supply | Qty: 100 | Fill #1

## 2021-10-11 MED FILL — DULOXETINE 30 MG CAPSULE,DELAYED RELEASE: ORAL | 30 days supply | Qty: 60 | Fill #4

## 2021-10-11 MED FILL — CYCLOSPORINE MODIFIED 25 MG CAPSULE: ORAL | 30 days supply | Qty: 90 | Fill #5

## 2021-10-11 MED FILL — MYCOPHENOLATE MOFETIL 250 MG CAPSULE: ORAL | 30 days supply | Qty: 120 | Fill #8

## 2021-10-11 MED FILL — MG-PLUS-PROTEIN 133 MG TABLET: ORAL | 30 days supply | Qty: 120 | Fill #6

## 2021-11-01 NOTE — Unmapped (Signed)
[facility] Refill Coordination Note    Specialty Medication(s) to be Shipped:   Transplant: mycophenolate mofetil 250mg , cyclosporine 100mg , and cyclosporine 25mg     Other medication(s) to be shipped:  duloxetine , mag-ox and pregabalin      Jeffrey Ward, DOB: 1950-10-23  Phone: [phone] (home)       All above HIPAA information was verified with patient.     Was a Nurse, learning disability used for this call? No    Completed refill call assessment today to schedule patient's medication shipment from the [facility] [phone]).  All relevant notes have been reviewed.     Specialty medication(s) and dose(s) confirmed: Regimen is correct and unchanged.   Changes to medications: Sumedh reports no changes at this time.  Changes to insurance: No  New side effects reported not previously addressed with a pharmacist or physician: None reported  Questions for the pharmacist: No    Confirmed patient received a Conservation officer, historic buildings and a Surveyor, mining with first shipment. The patient will receive a drug information handout for each medication shipped and additional FDA Medication Guides as required.       DISEASE/MEDICATION-SPECIFIC INFORMATION        N/A    SPECIALTY MEDICATION ADHERENCE     Medication Adherence    Patient reported X missed doses in the last month: 0  Specialty Medication: cyclosporine 100 mg  Patient is on additional specialty medications: Yes  Additional Specialty Medications: Cyclosporine 25 mg   Patient Reported Additional Medication X Missed Doses in the Last Month: 0  Patient is on more than two specialty medications: Yes  Specialty Medication: mycophenolate 250 mg  Patient Reported Additional Medication X Missed Doses in the Last Month: 0            Support network for adherence: family member                    Were doses missed due to medication being on hold? No    cyclosporine 25 mg: 10 days of medicine on hand   Cyclosporine 100 mg: 10 days of medicine on hand mycophenolate 250 mg: 10 days of medicine on hand       REFERRAL TO PHARMACIST     Referral to the pharmacist: Not needed      [facility]     Shipping address confirmed in Epic.     Delivery Scheduled: Yes, Expected medication delivery date: 11/05/21.     Medication will be delivered via UPS to the prescription address in Epic WAM.    Quintella Reichert   [facility] Specialty Technician

## 2021-11-04 MED FILL — CYCLOSPORINE MODIFIED 100 MG CAPSULE: ORAL | 30 days supply | Qty: 90 | Fill #6

## 2021-11-04 MED FILL — MG-PLUS-PROTEIN 133 MG TABLET: ORAL | 30 days supply | Qty: 120 | Fill #7

## 2021-11-04 MED FILL — CYCLOSPORINE MODIFIED 25 MG CAPSULE: ORAL | 30 days supply | Qty: 90 | Fill #6

## 2021-11-04 MED FILL — PREGABALIN 100 MG CAPSULE: ORAL | 30 days supply | Qty: 60 | Fill #0

## 2021-11-04 MED FILL — DULOXETINE 30 MG CAPSULE,DELAYED RELEASE: ORAL | 30 days supply | Qty: 60 | Fill #5

## 2021-11-04 MED FILL — MYCOPHENOLATE MOFETIL 250 MG CAPSULE: ORAL | 30 days supply | Qty: 120 | Fill #9

## 2021-11-25 ENCOUNTER — Other Ambulatory Visit: Payer: Self-pay | Admitting: Internal Medicine

## 2021-11-25 DIAGNOSIS — R42 Dizziness and giddiness: Secondary | ICD-10-CM

## 2021-11-25 MED ORDER — DULOXETINE 30 MG CAPSULE,DELAYED RELEASE
ORAL_CAPSULE | Freq: Every evening | ORAL | 5 refills | 30 days
Start: 2021-11-25 — End: 2022-05-24

## 2021-11-25 NOTE — Unmapped (Signed)
[facility] Refill Coordination Note    Specialty Medication(s) to be Shipped:   Transplant: mycophenolate mofetil 250mg , cyclosporine 25mg , and cyclosporine 100mg     Other medication(s) to be shipped:  magnesium,pregabalin100mg , basaglar,  cymbalta 30mg      Jeffrey Ward, DOB: 1950-07-01  Phone: [phone] (home)       All above HIPAA information was verified with patient.     Was a Nurse, learning disability used for this call? No    Completed refill call assessment today to schedule patient's medication shipment from the [facility] [phone]).  All relevant notes have been reviewed.     Specialty medication(s) and dose(s) confirmed: Regimen is correct and unchanged.   Changes to medications: Mccormick reports no changes at this time.  Changes to insurance: No  New side effects reported not previously addressed with a pharmacist or physician: None reported  Questions for the pharmacist: No    Confirmed patient received a Conservation officer, historic buildings and a Surveyor, mining with first shipment. The patient will receive a drug information handout for each medication shipped and additional FDA Medication Guides as required.       DISEASE/MEDICATION-SPECIFIC INFORMATION        N/A    SPECIALTY MEDICATION ADHERENCE     Medication Adherence    Patient reported X missed doses in the last month: 0  Specialty Medication: mycophenolate 250 mg capsule (CELLCEPT)  Patient is on additional specialty medications: Yes  Additional Specialty Medications: cycloSPORINE modified 25 MG capsule (NeoraL)  Patient Reported Additional Medication X Missed Doses in the Last Month: 0  Patient is on more than two specialty medications: Yes  Specialty Medication: cycloSPORINE modified 100 MG capsule (NeoraL)  Patient Reported Additional Medication X Missed Doses in the Last Month: 0            Support network for adherence: family member                    Were doses missed due to medication being on hold? No    mycophenolate 250  mg: 10 days of medicine on hand   cycloSPORINE modified 25  mg: 10 days of medicine on hand   cycloSPORINE modified 100 mg: 10 days of medicine on hand     REFERRAL TO PHARMACIST     Referral to the pharmacist: Not needed      SHIPPING     Shipping address confirmed in Epic.     Delivery Scheduled: Yes, Expected medication delivery date: 12/02/21.     Medication will be delivered via Same Day Courier to the prescription address in Epic WAM.    Ernestine Mcmurray   [facility] Specialty Technician

## 2021-11-26 MED ORDER — DULOXETINE 30 MG CAPSULE,DELAYED RELEASE
ORAL_CAPSULE | Freq: Every evening | ORAL | 5 refills | 30 days | Status: CP
Start: 2021-11-26 — End: 2022-05-25
  Filled 2021-12-02: qty 60, 30d supply, fill #0

## 2021-11-26 NOTE — Unmapped (Signed)
Last Visit Date: 05/22/2020  Next Visit Date: Visit date not found, staff message sent to scheduling team.     No results found for: CBC, CMP     Results for orders placed or performed in visit on 11/30/20   ECG 12 Lead   Result Value Ref Range    EKG Systolic BP  mmHg    EKG Diastolic BP  mmHg    EKG Ventricular Rate 58 BPM    EKG Atrial Rate 58 BPM    EKG P-R Interval 146 ms    EKG QRS Duration 92 ms    EKG Q-T Interval 418 ms    EKG QTC Calculation 410 ms    EKG Calculated P Axis 7 degrees    EKG Calculated R Axis 50 degrees    EKG Calculated T Axis 77 degrees    QTC Fredericia 413 ms

## 2021-11-29 NOTE — Unmapped (Signed)
Called pt to sch appt per Johana IB msg; Vmail full unable to leave msg.   Recall placed     Johana A Ceballos Rosillo, CMA  [facility] Scheduling And Referrals [place]  Cc: Les Pou, MD  Good evening,  Please contact patient and schedule follow up appt with Dr. Sima Matas, next available.  Thank you.

## 2021-12-02 MED FILL — CYCLOSPORINE MODIFIED 100 MG CAPSULE: ORAL | 30 days supply | Qty: 90 | Fill #7

## 2021-12-02 MED FILL — MYCOPHENOLATE MOFETIL 250 MG CAPSULE: ORAL | 30 days supply | Qty: 120 | Fill #10

## 2021-12-02 MED FILL — PREGABALIN 100 MG CAPSULE: ORAL | 30 days supply | Qty: 60 | Fill #1

## 2021-12-02 MED FILL — CYCLOSPORINE MODIFIED 25 MG CAPSULE: ORAL | 30 days supply | Qty: 90 | Fill #7

## 2021-12-02 MED FILL — MG-PLUS-PROTEIN 133 MG TABLET: ORAL | 30 days supply | Qty: 120 | Fill #8

## 2021-12-07 ENCOUNTER — Ambulatory Visit: Admit: 2021-12-07 | Discharge: 2021-12-08 | Payer: MEDICARE

## 2021-12-07 DIAGNOSIS — K766 Portal hypertension: Principal | ICD-10-CM

## 2021-12-07 DIAGNOSIS — I2721 Secondary pulmonary arterial hypertension: Principal | ICD-10-CM

## 2021-12-07 DIAGNOSIS — Z944 Liver transplant status: Principal | ICD-10-CM

## 2021-12-07 DIAGNOSIS — R0600 Dyspnea, unspecified: Principal | ICD-10-CM

## 2021-12-07 LAB — BASIC METABOLIC PANEL
ANION GAP: 6 mmol/L (ref 5–14)
BLOOD UREA NITROGEN: 31 mg/dL — ABNORMAL HIGH (ref 9–23)
BUN / CREAT RATIO: 14
CALCIUM: 9.3 mg/dL (ref 8.7–10.4)
CHLORIDE: 108 mmol/L — ABNORMAL HIGH (ref 98–107)
CO2: 23 mmol/L (ref 20.0–31.0)
CREATININE: 2.21 mg/dL — ABNORMAL HIGH
EGFR CKD-EPI (2021) MALE: 31 mL/min/{1.73_m2} — ABNORMAL LOW (ref >=60)
GLUCOSE RANDOM: 268 mg/dL — ABNORMAL HIGH (ref 70–99)
POTASSIUM: 5.1 mmol/L — ABNORMAL HIGH (ref 3.4–4.8)
SODIUM: 137 mmol/L (ref 135–145)

## 2021-12-07 NOTE — Unmapped (Signed)
Thank you for visiting the [facility].    If you have any questions or concerns, please call     H. James Kimbree Casanas, MD    Laura Nowicki, RN  Sara Decker, RN    Pulmonary Hypertension nurse coordinators:    [phone]

## 2021-12-07 NOTE — Unmapped (Signed)
[facility]   Pulmonary Hypertension Program                                             H. Sherre Lain, MD--Director (Pulmonary)     Denice Bors. Tresa Res, MD--Associate Director (Pulmonary)    Freeman Caldron, MD (Cardiology)         Marva Panda, RN Nurse Coordinator     Claretha Cooper, RN Nurse Coordinator    [phone] office - [phone] fax      INITIAL HPI:    [name]  is a 71 y.o. male  patient referred by Jeffrey Booze, MD for work up and evaluation for pulmonary hypertension.    PAH Treatment History:  Sildenafil 40 mg tid since post liver txplt hospitalization.    Pt describes a history of recent liver txplt in 09/2016.  Post op was noted to have previously unrecognized PAH and RV failure, treated initially with inhaled iloprost while on vent support and then transitioned to sildenafil.  He had a long post op course requiring a stay in rehab of about 2 weeks prior to discharge.   His post op course was further complicated by AKI (on CRRT then had renal recovery), afib, bradycardia requiring pacemaker placement.  CTA negative for PE post op.      No LE edema currently.      He continues on sildenafil 40 tid and is receiving from [facility] pharmacy.      He is no longer on warfarin.    No syncope or near syncope.      Denies dyspnea, syncope or near syncope.      Had recent hip fx s/p surgery end of Sept 2019.  Currently undergoing rehab.      Has been having URI symptoms last two day.  Antibx was called in by his PCP.  No fevers.      INTERVAL HPI 11/19/19:    Patient reports doing well.  No changes since last visit aside from feeling a little more dyspneic with activities.  He has gained weight and thinks may be related to that.    No syncope or near syncope.  No LE edema noted.    Continues on sildenafil.      Patient refuses to get COVID vaccine.      INTERVAL HPI 07/31/20:    Patient continues to feel weakness and some dypsnea, but not changed compared to prior visit.    No LE edema noted.  No chest pain.     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    No syncope or near syncope.     INTERVAL HPI 12/01/20:    Patient here for follow up. Continues with same level of dyspnea he has had since liver tsplt.      No LE edema.     Denies syncope.      Continues on sildenafil for POPH.    Is overdue for echo--states he was not called to schedule after it was ordered at last visit.    INTERVAL HPI 04/06/21:    States he is doing well in terms of exertional capacity.  No syncope or near syncope.  No LE edema noted.    Asks for urology referral due to concern for BPH.    Continues  on sildenafil with no issues for POPH.      Overdue for follow up with hepatology.      INTERVAL HPI 08/06/21:    Patient here for follow up.      No new issues.  Denies syncope or LE edema.    Remains on sildenafil for POPH.    He has followed up with hepatology since last visit.      No changes in exertional capacity noted by patient.    INTERVAL HPI 12/07/21:    Here for follow up.  No dyspnea.    Denies syncope or near syncope.     No LE edema.    Echocardiogram has not been done yet.  Patient is not sure if he called to schedule or not.      Continues on sildenafil.        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      The patient's current NYHA functional class is Class 2 - comfortable at rest but have symptoms with ordinary physcial activity.     Will get follow up echo as this is overdue.  He was not called to schedule.  Will need to provide him number to schedule again.  Explained importance of the test to follow up status of PH.      Overall he appears stable with no signs of RV failure clinically.      Plan:          1) continue sildenafil 40 mg tid.      2) next visit in 6 months    3) pro BNP and BMP today    4) echo in coming weeks-gave patient number for scheduling again    5) he declines flu shot      Subjective:      Past medical history is significant for:   Past Medical History:   Diagnosis Date    Atrial fibrillation (CMS-HCC)     Basal cell carcinoma     Cancer (CMS-HCC)     Cirrhosis (CMS-HCC)     Depression     Diabetes (CMS-HCC)     Headache     Hepatitis C 07/17/2012    History of transfusion     Liver disease     Low back pain 07/17/2012    Varices, esophageal (CMS-HCC)       Family History   Problem Relation Age of Onset    Hypertension Mother     Cancer Mother     Heart disease Mother     Heart disease Father     Cirrhosis Neg Hx     Liver cancer Neg Hx     Anemia Neg Hx     Diabetes Neg Hx     Kidney disease Neg Hx     Obesity Neg Hx     Thyroid disease Neg Hx     Osteoporosis Neg Hx     Coronary artery disease Neg Hx     Anesthesia problems Neg Hx     Basal cell carcinoma Neg Hx     Squamous cell carcinoma Neg Hx       Social History     Socioeconomic History    Marital status: Widowed   Tobacco Use    Smoking status: Never    Smokeless tobacco: Never    Tobacco comments:     Smoked in high school  for about 5 years.    Substance and Sexual Activity    Alcohol use: No    Drug use: No    Sexual activity: Yes     Partners: Female     Birth control/protection: None   Other Topics Concern    Exercise No    Living Situation No      Past Surgical History:   Procedure Laterality Date    ANKLE SURGERY      BACK SURGERY      BACK SURGERY      CARDIAC SURGERY      pacemaker    CHG US GUIDE, TISSUE ABLATION N/A 01/22/2016    Procedure: ULTRASOUND GUIDANCE FOR, AND MONITORING OF, PARENCHYMAL TISSUE ABLATION;  Surgeon: Particia Nearing, MD;  Location: MAIN OR [place];  Service: Transplant    IR EMBOLIZATION ORGAN ISCHEMIA, TUMORS, INFAR  06/16/2016    IR EMBOLIZATION ORGAN ISCHEMIA, TUMORS, INFAR 06/16/2016 Ammie Dalton, MD IMG VIR H&V [facility]    PR COLON CA SCRN NOT HI RSK IND N/A 02/27/2015    Procedure: COLOREC CNCR SCR;COLNSCPY NO;  Surgeon: Vonda Antigua, MD;  Location: [facility]URES [facility];  Service: Gastroenterology    PR ENDOSCOPIC ULTRASOUND EXAM N/A 02/27/2015    Procedure: UGI ENDO; W/ENDO ULTRASOUND EXAM INCLUDES ESOPHAGUS, STOMACH, &/OR DUODENUM/JEJUNUM;  Surgeon: Vonda Antigua, MD;  Location: GI PROC[facility]URES [facility];  Service: Gastroenterology    PR ERCP BALLOON DILATE BILIARY/PANC DUCT/AMPULLA EA N/A 02/10/2017    Procedure: ERCP;WITH TRANS-ENDOSCOPIC BALLOON DILATION OF BILIARY/PANCREATIC DUCT(S) OR OF AMPULLA, INCLUDING SPHINCTERECTOMY, WHEN PERFOREMD,EACH DUCT (09811);  Surgeon: Mayford Knife, MD;  Location: GI [facility]URES [facility];  Service: Gastroenterology    PR ERCP REMOVE FOREIGN BODY/STENT BILIARY/PANC DUCT N/A 02/10/2017    Procedure: ENDOSCOPIC RETROGRADE CHOLANGIOPANCREATOGRAPHY (ERCP); W/ REMOVAL OF FOREIGN BODY/STENT FROM BILIARY/PANCREATIC DUCT(S);  Surgeon: Mayford Knife, MD;  Location: GI PROCEDURES [facility];  Service: Gastroenterology    PR ERCP REMOVE FOREIGN BODY/STENT BILIARY/PANC DUCT N/A 06/29/2017    Procedure: ENDOSCOPIC RETROGRADE CHOLANGIOPANCREATOGRAPHY (ERCP); W/ REMOVAL OF FOREIGN BODY/STENT FROM BILIARY/PANCREATIC DUCT(S);  Surgeon: Vonda Antigua, MD;  Location: GI PROC[facility]URES [facility];  Service: Gastroenterology    PR ERCP STENT PLACEMENT BILIARY/PANCREATIC DUCT N/A 11/29/2016    Procedure: ENDOSCOPIC RETROGRADE CHOLANGIOPANCREATOGRAPHY (ERCP); WITH PLACEMENT OF ENDOSCOPIC STENT INTO BILIARY OR PANCREATIC DUCT;  Surgeon: Chriss Driver, MD;  Location: GI PROCEDURES [facility];  Service: Gastroenterology    PR ERCP STENT PLACEMENT BILIARY/PANCREATIC DUCT N/A 04/26/2017    Procedure: ENDOSCOPIC RETROGRADE CHOLANGIOPANCREATOGRAPHY (ERCP); WITH PLACEMENT OF ENDOSCOPIC STENT INTO BILIARY OR PANCREATIC DUCT;  Surgeon: Mayford Knife, MD;  Location: GI PROCEDURES [facility];  Service: Gastroenterology    PR ERCP,W/REMOVAL STONE,BIL/PANCR DUCTS N/A 11/29/2016    Procedure: ERCP; W/ENDOSCOPIC RETROGRADE REMOVAL OF CALCULUS/CALCULI FROM BILIARY &/OR PANCREATIC DUCTS;  Surgeon: Chriss Driver, MD;  Location: GI PROC[facility]URES [facility];  Service: Gastroenterology    PR ERCP,W/REMOVAL STONE,BIL/PANCR DUCTS N/A 02/10/2017    Procedure: ERCP; W/ENDOSCOPIC RETROGRADE REMOVAL OF CALCULUS/CALCULI FROM BILIARY &/OR PANCREATIC DUCTS;  Surgeon: Mayford Knife, MD;  Location: GI [facility]URES [facility];  Service: Gastroenterology    PR ERCP,W/REMOVAL STONE,BIL/PANCR DUCTS N/A 04/26/2017    Procedure: ERCP; W/ENDOSCOPIC RETROGRADE REMOVAL OF CALCULUS/CALCULI FROM BILIARY &/OR PANCREATIC DUCTS;  Surgeon: Mayford Knife, MD;  Location: GI PROCEDURES [facility];  Service: Gastroenterology    PR ERCP,W/REMOVAL STONE,BIL/PANCR DUCTS N/A 06/29/2017    Procedure: ERCP; W/ENDOSCOPIC RETROGRADE REMOVAL OF CALCULUS/CALCULI FROM  BILIARY &/OR PANCREATIC DUCTS;  Surgeon: Vonda Antigua, MD;  Location: GI PROCEDURES [facility];  Service: Gastroenterology    PR ESOPHAGOSCP RIG TRANSORAL HYPOPHARYNX CRV ESOPH N/A 04/04/2018    Procedure: ESOPHAGOSCOPY, RIGID, TRANSORAL WITH DIVERTICULECTOMY OF HYPOPHARYNX OR CERVICAL ESOPHAGUS, WITH CRICOPHARYNGEAL MYOTOMY, INCLUDES USE OF TELESCOPE OR OPERATING MICROSCOPE AND REPAIR, WHEN PERFORMED;  Surgeon: Vista Deck, MD;  Location: MAIN OR [facility];  Service: ENT    PR INSER HEART TEMP PACER ONE CHMBR N/A 10/02/2016    Procedure: Tempoarary Pacemaker Insertion;  Surgeon: Meredith Leeds, MD;  Location: [facility];  Service: Cardiology    PR LAP,DIAGNOSTIC ABDOMEN N/A 01/22/2016    Procedure: Laparoscopy, Abdomen, Peritoneum, & Omentum, Diagnostic, W/Wo Collection Specimen(S) By Brushing Or Washing;  Surgeon: Particia Nearing, MD;  Location: MAIN OR [facility];  Service: Transplant    PR LARYNGOPLASTY MEDIALIZATION Neysa Bonito N/A 11/07/2018    Procedure: R31 LARYNGOPLASTY, MEDIALIZATION, UNILATERAL;  Surgeon: Vista Deck, MD;  Location: MAIN OR [facility];  Service: ENT    PR PLACE PERCUT GASTROSTOMY TUBE N/A 11/17/2016    Procedure: UGI ENDO; W/DIRECTED PLCMT PERQ GASTROSTOMY TUBE;  Surgeon: Cletis Athens, MD;  Location: GI PROCEDURES [facility];  Service: Gastroenterology    PR TRACHEOSTOMY, PLANNED N/A 09/29/2016    Procedure: TRACHEOSTOMY PLANNED (SEPART PROC);  Surgeon: Katherina Mires, MD;  Location: MAIN OR [facility];  Service: Trauma    PR TRANSCATH INSERT OR REPLACE LEADLESS PM VENTR N/A 10/11/2016    Procedure: Pacemaker Implant/Replace Leadless;  Surgeon: Meredith Leeds, MD;  Location: [facility];  Service: Cardiology    PR TRANSPLANT LIVER,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    Procedure: Liver Allotransplantation; Orthotopic, Partial Or Whole, From Cadaver Or Living Donor, Any Age;  Surgeon: Doyce Loose, MD;  Location: MAIN OR [facility];  Service: Transplant    PR TRANSPLANT,PREP DONOR LIVER, WHOLE N/A 09/15/2016    Procedure: Rogelia Boga Std Prep Cad Donor Whole Liver Gft Prior Tnsplnt,Inc Chole,Diss/Rem Surr Tissu Wo Triseg/Lobe Splt;  Surgeon: Doyce Loose, MD;  Location: MAIN OR [facility];  Service: Transplant    PR UPPER GI ENDOSCOPY,BIOPSY N/A 07/17/2012    Procedure: UGI ENDOSCOPY; WITH BIOPSY, SINGLE OR MULTIPLE;  Surgeon: Alba Destine, MD;  Location: GI PROC[facility]URES [facility];  Service: Gastroenterology    PR UPPER GI ENDOSCOPY,DIAGNOSIS N/A 02/04/2014    Procedure: UGI ENDO, INCLUDE ESOPHAGUS, STOMACH, & DUODENUM &/OR JEJUNUM; DX W/WO COLLECTION SPECIMN, BY BRUSH OR WASH;  Surgeon: Wilburt Finlay, MD;  Location: GI PROCEDURES [facility];  Service: Gastroenterology    PR UPPER GI ENDOSCOPY,LIGAT VARIX N/A 11/05/2013    Procedure: UGI ENDO; Everlene Balls LIG ESOPH &/OR GASTRIC VARICES;  Surgeon: Wilburt Finlay, MD;  Location: GI PROCEDURES [facility];  Service: Gastroenterology        Objective:   Objective   Review of systems is significant for:     As stated in the HPI, remainder of 12 system review is negative.      No Known Allergies Current Outpatient Medications   Medication Sig Dispense Refill    amiodarone (PACERONE) 200 MG tablet Take by mouth.      BASAGLAR KWIKPEN U-100 INSULIN 100 unit/mL (3 mL) injection pen Inject 14 Units subcutaneously at bedtime 15 mL 4    bisacodyL (DULCOLAX) 5 mg EC tablet Take 1 tablet (5 mg total) by mouth daily as needed.      cholecalciferol, vitamin D3, 125 mcg (5,000 unit) tablet Take 1 tablet (125 mcg total)  by mouth daily.      cycloSPORINE modified (NEORAL) 100 MG capsule Take 2 capsules (200 mg total) by mouth daily AND 1 capsule (100 mg total) nightly. 270 capsule 3    cycloSPORINE modified (NEORAL) 25 MG capsule Take 3 capsules (75 mg total) by mouth nightly (with a 100mg  capsule). For a total dose of 200 mg in the morning , 175mg  every evening 270 capsule 3    DULoxetine (CYMBALTA) 30 MG capsule Take 2 capsules (60 mg total) by mouth nightly. 60 capsule 5    magnesium oxide-Mg AA chelate (MAGNESIUM, AMINO ACID CHELATE,) 133 mg Take 2 tablets by mouth Two (2) times a day. 120 tablet PRN    melatonin 3 mg Tab Take 1 tablet (3 mg total) by mouth nightly. 30 tablet 0    metoprolol succinate (TOPROL-XL) 25 MG 24 hr tablet Take 1 tablet (25 mg total) by mouth.      mirtazapine (REMERON) 15 MG tablet Take 1 tablet (15 mg total) by mouth nightly. 90 tablet 3    mycophenolate (CELLCEPT) 250 mg capsule Take 2 capsules (500 mg total) by mouth two (2) times a day. 120 capsule 11    pregabalin (LYRICA) 100 MG capsule Take 1 capsule (100 mg total) by mouth Two (2) times a day. 60 capsule 5    sildenafiL, pulm.hypertension, (REVATIO) 20 mg tablet Take 2 tablets (40 mg total) by mouth Three (3) times a day. 180 tablet 6    tamsulosin (FLOMAX) 0.4 mg capsule Take 1 capsule (0.4 mg total) by mouth daily. 90 capsule 0    blood-glucose meter (GLUCOSE MONITORING KIT) kit Use as instructed 1 each 0    pen needle, diabetic (ULTICARE PEN NEEDLE) 32 gauge x 5/32 (4 mm) Ndle USE WITH INSULIN PEN 4 TIMES DAILY AS DIRECTED 100 each 1    pen needle, diabetic (ULTICARE PEN NEEDLE) 32 gauge x 5/32 (4 mm) Ndle Use as directed four times daily 100 each 1    pregabalin (LYRICA) 100 MG capsule Take 1 capsule (100 mg total) by mouth Two (2) times a day. (Patient not taking: Reported on 12/07/2021) 60 capsule 5    pregabalin (LYRICA) 75 MG capsule Take 1 capsule (75 mg total) by mouth Two (2) times a day. (Patient not taking: Reported on 12/07/2021) 60 capsule 6    rosuvastatin (CRESTOR) 5 MG tablet Take 1 tablet (5 mg total) by mouth daily. 90 tablet 3     No current facility-administered medications for this visit.        Physical Exam:    weight is 65.7 kg (144 lb 12.8 oz). His skin temperature is 36.3 ??C (97.4 ??F). His blood pressure is 147/75 and his pulse is 55. His oxygen saturation is 100%.   General Appearance:    Alert, cooperative, no distress, appears stated age   Head:    Normocephalic, without obvious abnormality, atraumatic   Eyes:    PERRL, conjunctiva clear, EOM's intact    Oropharynx:   No lesions   Neck:   Supple, trachea midline, no adenopathy;    No JVD   Lungs:     Good breath sounds b/l, no crackles or wheezes, respirations unlabored   Chest Wall:    No tenderness or deformity    Heart:    Regular rate and rhythm, S1 and S2 normal, no murmur, rub   or gallop   Abdomen:     Soft, non-tender, normal bowel sounds, no masses, no organomegaly.  +  G tube in place   Extremities:   Extremities normal, no cyanosis, clubbing, or edema   Pulses:   2+ and symmetric all extremities   Skin:   No rashes or lesions   Lymph nodes:   No cervical or supraclavicular adenopathy   Neurologic:   No focal neurological deficits       Diagnostic Review:    Echo 04/21/17:    Left Ventricle The left ventricle is normal in size with normal wall thickness.   The left ventricular systolic function is normal.   The ejection fraction is visually estimated at 65 to 70%.   There is impaired left ventricular relaxation with normal left ventricular filling pressure consistent with grade I diastolic dysfunction.   Mitral Valve The mitral valve leaflets are normal, with normal leaflet mobility.   There is no significant mitral regurgitation by color and continuous wave Doppler imaging.   Left Atrium The left atrium is dilated.   Aortic Valve The aortic valve is poorly visualized with probably normal excursion.   There is no significant aortic regurgitation by color and continuous wave Doppler imaging.  There is no evidence of a significant transvalvular gradient.   - Peak transvalvular velocity: 1 m/sec   Ascending Aorta The aorta is normal in size in the visualized segments.   Pulmonary Artery The pulmonary artery is not well visualized.   Pulmonic Valve The pulmonic valve is poorly visualized, but probably normal.   There is no significant pulmonic regurgitation by color and continuous wave Doppler imaging.   There is no evidence of a significant transvalvular gradient.   - Peak transvalvular velocity: 0.9 m/sec   Right Ventricle The right ventricle is normal in size with normal wall thickness.   The right ventricular systolic function is normal.   Tricuspid Valve The tricuspid valve leaflets are normal, with normal leaflet mobility.   There is trivial tricuspid regurgitation by color and continuous wave Doppler imaging.   - The Doppler signal is suboptimal and pulmonary artery systolic pressure cannot be accurately estimated.   Right Atrium The right atrium is normal in size.   IVC/SVC The IVC diameter is <=21 mm with >50% decrease in size with inspiration suggesting normal right atrial pressure (0-5 mm Hg).   Pericardium There is no evidence of a significant pericardial effusion.         V/Q scan 01/11/17:    Impression     Normal ventilation and perfusion scan.         CT scan PE protocol performed on 09/2016 post op revealed:    Impression     No CT evidence for acute pulmonary embolism, as clinically questioned.    Moderate bilateral pleural effusions similar to prior CT dated 01/30/2016 with slightly increased atelectasis of the adjacent lung parenchyma in the bilateral lower lobes, left greater than right.    Addendum (Dr.Altun dictating 09/27/2016 08:35 AM) Focal contour bulging of the liver adjacent to the diaphragm and right kidney is noted which is not well evaluated due to the presence of streak artifacts.          Pulmonary Function Test performed on 11/24/15 show:            Consistent with  normal spirometry and normal DLCO    6 Minute Walk Test performed on 01/06/17:         Echo performed on 09/27/16:    Echocardiographic Findings     Left Ventricle The left ventricle is normal in size  with normal wall thickness.   The left ventricular systolic function is normal.   The ejection fraction is visually estimated at 60 to 65%.   Left ventricular diastolic function cannot be accurately assessed.      Left Atrium The left atrium is normal in size.      Right Ventricle The right ventricle is moderately dilated.   The right ventricular systolic function is decreased.      Tricuspid Valve The tricuspid valve is not well visualized.   There is mild tricuspid regurgitation by color and continuous wave Doppler imaging.   The regurgitation jet is directed toward the septum.   The pulmonary artery systolic pressure is severely elevated.   - Peak tricuspid regurgitant velocity: 4.6 m/sec  - Estimated pulmonary artery systolic pressure: 80 + RA pressure mmHg      Right Atrium The right atrium is poorly visualized but appears dilated      IVC/SVC Mechanical ventilation precludes the ability to accurately assess right atrial pressure.      Pericardium There is a small anterior pericardial effusion.          [facility] ICU Catheterization performed on date 09/22/16:     Wedge pressure initially looks to be 15-16 but with further balloon deflation this clearly decreases further to 8 mmHg with very good quality tracing. At that time mPAP 50, CVP 9, CO by CCO measurements 4.1/CI 2.2, PVR approximating 10 WU.

## 2021-12-08 LAB — PRO-BNP: PRO-BNP: 111 pg/mL (ref 0.0–125.0)

## 2021-12-13 ENCOUNTER — Institutional Professional Consult (permissible substitution)
Admit: 2021-12-13 | Discharge: 2021-12-13 | Payer: MEDICARE | Attending: Nurse Practitioner | Primary: Nurse Practitioner

## 2021-12-13 DIAGNOSIS — I499 Cardiac arrhythmia, unspecified: Principal | ICD-10-CM

## 2021-12-13 NOTE — Unmapped (Unsigned)
[facility]  Cardiac Device Clinic-  [place]  Tel: [phone]  Fax: [phone]    Cardiac Implanted Electronic Device Evaluation-In Person Outpatient Visit    Visit Date:  12/13/2021    Manufacturer of Device: [employer]     Model: MICRA  Type of Device: Single Chamber Pacemaker  Implant date:10/11/2016    Battery: >8 years estimated longevity    Mode: VVI  Lower rate limit: 40 bpm    Presenting rhythm:  VS  Underlying rhythm: AF   Dependent: No    Pacing Percentages  RVP: <0.1%    Impedence (ohms)  Right Ventricular lead: 530     Sensing (mV)  Right Ventricular lead: 9.83mV     Capture threshold (V @ ms)  Right Ventricular lead: 0.75V @ 0.31ms    Diagnostics/Episodes  NONE    Fluid Monitoring   Not Applicable       Reprogramming  NONE    Follow-up Plan:  Schedule Clinic Follow-Up in 1 years patient does participate in remote monitoring RE-ESTABLISH REMOTE MONIOTRING      Please see scanned and / or downloaded PDF file in this patient's chart for this encounter for full details of device interrogation and reprogramming.

## 2021-12-13 NOTE — Unmapped (Incomplete)
Referring Provider: Orlean Bradford, AGNP  [address]  [address]  [place],  [place] [address]   Primary Provider: [name], MD  [address] [facility] [place] [address]   Other Providers:  Dr Reginal Lutes EP Follow Up Note    Reason for Visit:  Jeffrey Ward is a 71 y.o. male being seen for routine visit and continued care of his leadless pacemaker .    Assessment & Plan:    1. Bradycardia   s/p Micra leadless pacemaker  Device checked by device nurse  I have reviewed and agree with the findings  Normal device function  V pace - 0.8%  Events - none  Battery - >8 years  Underlying Rhythm - SR w/ frequent PACs  Device Dependent - no  Continue remotes       2. Postoperative Atrial Fibrillation  CHA2DS2-VASc - 3 (age, dm, hf)  No clinical return.   Off amiodarone since 2022 (removed from list today, unclear why it was on list as patient reports he is not taking it.)  Metoprolol succinate 25mg  daily     EKG today interpreted by machine as afib but this is incorrect, actually SR with frequent PACs.        ECG:  SR with frequent PACs, 77bpm .  See official ECG report.    Follow-up:  Return for annual device check.    History of Present Illness:  Jeffrey Ward is a 71 y.o. 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 Previously on amiodarone, stopped in 2022.       Interval History: Doing well. Feels great. No arrhythmias that he is aware of. History & Procedures:    Cath / PCI:  ***    CV Surgery:  ***    EP Procedures and Devices:  ***    Non-Invasive Evaluation(s):  Echo:  12/10/2019 TTE    1. Technically difficult study due to chest wall/lung interference.    2. LV systolic function is normal, LVEF is visually estimated at 55-60%.    3. Aortic valve is probably trileaflet with mildly thickened leaflets with normal excursion.    4. The right ventricle is normal in size, with normal systolic function.  ***    Holter:  ***    Cardiac CT/MRI/Nuclear Tests:  ***               Other Past Medical History:  See below for the complete EPIC list of past medical and surgical history.      Allergies:  Patient has no known allergies.    Current Medications:    Current Outpatient Medications on File Prior to Visit   Medication Sig    BASAGLAR KWIKPEN U-100 INSULIN 100 unit/mL (3 mL) injection pen Inject 14 Units subcutaneously at bedtime    bisacodyL (DULCOLAX) 5 mg EC tablet Take 1 tablet (5 mg total) by mouth daily as needed.    blood-glucose meter (GLUCOSE MONITORING KIT) kit Use as instructed    cholecalciferol, vitamin D3, 125 mcg (5,000 unit)  tablet Take 1 tablet (125 mcg total) by mouth daily.    cycloSPORINE modified (NEORAL) 100 MG capsule Take 2 capsules (200 mg total) by mouth daily AND 1 capsule (100 mg total) nightly.    cycloSPORINE modified (NEORAL) 25 MG capsule Take 3 capsules (75 mg total) by mouth nightly (with a 100mg  capsule). For a total dose of 200 mg in the morning , 175mg  every evening    DULoxetine (CYMBALTA) 30 MG capsule Take 2 capsules (60 mg total) by mouth nightly.    magnesium oxide-Mg AA chelate (MAGNESIUM, AMINO ACID CHELATE,) 133 mg Take 2 tablets by mouth Two (2) times a day.    melatonin 3 mg Tab Take 1 tablet (3 mg total) by mouth nightly.    metoprolol succinate (TOPROL-XL) 25 MG 24 hr tablet Take 1 tablet (25 mg total) by mouth.    mycophenolate (CELLCEPT) 250 mg capsule Take 2 capsules (500 mg total) by mouth two (2) times a day.    pen needle, diabetic (ULTICARE PEN NEEDLE) 32 gauge x 5/32 (4 mm) Ndle Use as directed four times daily    pregabalin (LYRICA) 100 MG capsule Take 1 capsule (100 mg total) by mouth Two (2) times a day.    pregabalin (LYRICA) 100 MG capsule Take 1 capsule (100 mg total) by mouth Two (2) times a day.    pregabalin (LYRICA) 75 MG capsule Take 1 capsule (75 mg total) by mouth Two (2) times a day.    rosuvastatin (CRESTOR) 5 MG tablet Take 1 tablet (5 mg total) by mouth daily.    sildenafiL, pulm.hypertension, (REVATIO) 20 mg tablet Take 2 tablets (40 mg total) by mouth Three (3) times a day.    tamsulosin (FLOMAX) 0.4 mg capsule Take 1 capsule (0.4 mg total) by mouth daily.    mirtazapine (REMERON) 15 MG tablet Take 1 tablet (15 mg total) by mouth nightly.    pen needle, diabetic (ULTICARE PEN NEEDLE) 32 gauge x 5/32 (4 mm) Ndle USE WITH INSULIN PEN 4 TIMES DAILY AS DIRECTED    [DISCONTINUED] gabapentin (NEURONTIN) 100 MG capsule Take 1 capsule (100 mg total) by mouth Three (3) times a day.     No current facility-administered medications on file prior to visit.       Family History:  The patient's family history includes Cancer in his mother; Heart disease in his father and mother; Hypertension in his mother.    Social history:  He  reports that he has never smoked. He has never used smokeless tobacco. He reports that he does not drink alcohol and does not use drugs.    Review of Systems:  {Blank single:19197::As per HPI,As per HPI and as follows}.  Rest of the review of {Blank single:19197::twelve,ten} systems is negative or unremarkable except as stated above.    Physical Exam:  VITAL SIGNS:   Vitals:    12/13/21 1321   BP: 112/69   SpO2: 98%      Wt Readings from Last 3 Encounters:   12/13/21 64.3 kg (141 lb 12.8 oz)   12/07/21 65.7 kg (144 lb 12.8 oz)   08/06/21 67 kg (147 lb 12.8 oz)      Today's Body mass index is 20.93 kg/m??.   Height: 175.3 cm (5' 9.02)    GENERAL: {Blank single:19197::chronically ill-appearing in no acute distress,well-appearing in no acute distress}  HEENT: Normocephalic and atraumatic. Conjunctivae and sclerae clear and anicteric.    NECK: Supple, JVP {Blank multiple:19196::not seen above the clavicle with  HOB at *** degrees,*** cm H2O,***cm above the clavicle with HOB at *** degrees}.   CARDIOVASCULAR: Rate and rhythm are {Blank single:19197::irregularly irregular,regular with frequent ectopic beats,regular}.  {Blank single:19197::Normal} S1, S2. There is {Blank multiple:19196::no murmur, gallops or rubs,a grade ***/VI *** murmur, loudest at ***,S3 gallop,S4 gallop,no rub}  RESPIRATORY: {Blank single:19197::Decreased breath sounds at bases,Bibasilar crackles up to ***,left basilar crackles,right basilar crackles,Normal respiratory effort.,Normal respiratory effort. Clear to auscultation bilaterally.}.  There are {Blank single:19197::wheezes,no wheezes}.  ABDOMEN: Soft, non-tender, {Blank single:19197::Abdomen distended.,Abdomen nondistended.}    EXTREMITIES:  Radial pulses are {Blank single:19197::nonpalpable,1+,1-2+,2+}, {Blank single:19197::left greater than right,right greater than left,bilaterally}.   There is {Blank single:19197::3+ pitting pedal/pretibial edema,2+ pitting pedal/pretibial edema,1+ pitting pedal/pretibial edema,trace pedal/pretibial edema,***+ nonpitting edema,no pedal edema}, {Blank single:19197::left greater than right,right greater than left,bilaterally}.   SKIN: No rashes, ecchymosis or petechiae.  Warm, well perfused. Well healed left sided  Pacemaker/ICD *** scar   NEURO/PSYCH: Alert and oriented x 3. Affect appropriate.  Nonfocal    Pertinent Laboratory Studies:   Clinical Support on 12/13/2021   Component Date Value Ref Range Status    EKG Ventricular Rate 12/13/2021 77  BPM Preliminary    EKG QRS Duration 12/13/2021 84  ms Preliminary    EKG Q-T Interval 12/13/2021 364  ms Preliminary    EKG QTC Calculation 12/13/2021 411  ms Preliminary    EKG Calculated R Axis 12/13/2021 64  degrees Preliminary    EKG Calculated T Axis 12/13/2021 78  degrees Preliminary    QTC Fredericia 12/13/2021 395  ms Preliminary   Office Visit on 12/07/2021   Component Date Value Ref Range Status    Sodium 12/07/2021 137  135 - 145 mmol/L Final    Potassium 12/07/2021 5.1 (H)  3.4 - 4.8 mmol/L Final    Chloride 12/07/2021 108 (H)  98 - 107 mmol/L Final    CO2 12/07/2021 23.0  20.0 - 31.0 mmol/L Final    Anion Gap 12/07/2021 6  5 - 14 mmol/L Final    BUN 12/07/2021 31 (H)  9 - 23 mg/dL Final    Creatinine 16/12/9602 2.21 (H)  0.60 - 1.10 mg/dL Final    BUN/Creatinine Ratio 12/07/2021 14   Final    eGFR CKD-EPI (2021) Male 12/07/2021 31 (L)  >=60 mL/min/1.64m2 Final    Glucose 12/07/2021 268 (H)  70 - 99 mg/dL Final    Calcium 54/11/8117 9.3  8.7 - 10.4 mg/dL Final    PRO-BNP 14/78/2956 111.0  0.0 - 125.0 pg/mL Final       Lab Results   Component Value Date    PRO-BNP 111.0 12/07/2021    PRO-BNP 313.0 (H) 04/06/2021    PRO-BNP 359.0 (H) 12/01/2020    Creatinine Whole Blood, POC 1.0 09/25/2015    Creatinine Whole Blood, POC 0.9 04/03/2015    Creatinine 2.21 (H) 12/07/2021    Creatinine 1.93 (H) 05/17/2021    BUN 31 (H) 12/07/2021    BUN 37 (H) 05/17/2021    BUN 34 (H) 09/25/2018    BUN 41 (H) 08/27/2018    Sodium 137 12/07/2021    Sodium Whole Blood 141 09/26/2016    Potassium 5.1 (H) 12/07/2021    Potassium, Bld 4.1 09/26/2016    CO2 23.0 12/07/2021    CO2 29 10/10/2013    Magnesium 1.6 05/17/2021    Total Bilirubin 0.6 05/17/2021    Total Bilirubin 0.5 12/12/2017    INR 1.01 11/05/2018    INR 1.3 01/09/2014  Lab Results   Component Value Date    Digoxin Level 0.7 (L) 06/08/2017       Lab Results   Component Value Date    TSH 2.082 04/06/2021    TSH 2.70 07/21/2010    Cholesterol 212 (H) 12/26/2016    Triglycerides 101 12/26/2016    HDL 70 (H) 12/26/2016    Non-HDL Cholesterol 142 12/26/2016    LDL Calculated 122 (H) 12/26/2016       Lab Results   Component Value Date    WBC 4.8 05/17/2021    WBC 6.9 09/25/2018    WBC 2.4 (L) 01/09/2014    HGB 12.1 (L) 05/17/2021    Hemoglobin 6.2 (L) 09/26/2016    HCT 35.4 (L) 05/17/2021    HCT 35.4 (L) 01/09/2014    Platelet 117 (L) 05/17/2021    Platelet 35 (L) 01/09/2014         Past Medical History:   Diagnosis Date    Atrial fibrillation (CMS-HCC)     Basal cell carcinoma     Cancer (CMS-HCC)     Cirrhosis (CMS-HCC)     Depression     Diabetes (CMS-HCC)     Headache     Hepatitis C 07/17/2012    History of transfusion     Liver disease     Low back pain 07/17/2012    Varices, esophageal (CMS-HCC)        Past Surgical History:   Procedure Laterality Date    ANKLE SURGERY      BACK SURGERY      BACK SURGERY      CARDIAC SURGERY      pacemaker    CHG US GUIDE, TISSUE ABLATION N/A 01/22/2016    Procedure: ULTRASOUND GUIDANCE FOR, AND MONITORING OF, PARENCHYMAL TISSUE ABLATION;  Surgeon: Particia Nearing, MD;  Location: MAIN OR [place];  Service: Transplant    IR EMBOLIZATION ORGAN ISCHEMIA, TUMORS, INFAR  06/16/2016    IR EMBOLIZATION ORGAN ISCHEMIA, TUMORS, INFAR 06/16/2016 Ammie Dalton, MD IMG VIR H&V [facility]    PR COLON CA SCRN NOT HI RSK IND N/A 02/27/2015    Procedure: COLOREC CNCR SCR;COLNSCPY NO;  Surgeon: Vonda Antigua, MD;  Location: [facility]URES [facility];  Service: Gastroenterology    PR ENDOSCOPIC ULTRASOUND EXAM N/A 02/27/2015    Procedure: UGI ENDO; W/ENDO ULTRASOUND EXAM INCLUDES ESOPHAGUS, STOMACH, &/OR DUODENUM/JEJUNUM;  Surgeon: Vonda Antigua, MD;  Location: GI PROCEDURES MEMORIAL [facility];  Service: Gastroenterology    PR ERCP BALLOON DILATE BILIARY/PANC DUCT/AMPULLA EA N/A 02/10/2017    Procedure: ERCP;WITH TRANS-ENDOSCOPIC BALLOON DILATION OF BILIARY/PANCREATIC DUCT(S) OR OF AMPULLA, INCLUDING SPHINCTERECTOMY, WHEN PERFOREMD,EACH DUCT ([address]);  Surgeon: Mayford Knife, MD;  Location: GI PROCEDURES [facility];  Service: Gastroenterology PLACEMENT BILIARY/PANCREATIC DUCT N/A 04/26/2017    Procedure: ENDOSCOPIC RETROGRADE CHOLANGIOPANCREATOGRAPHY (ERCP); WITH PLACEMENT OF ENDOSCOPIC STENT INTO BILIARY OR PANCREATIC DUCT;  Surgeon: Mayford Knife, MD;  Location: GI PROCEDURES [facility]ORIAL [facility];  Service: Gastroenterology    PR ERCP,W/REMOVAL STONE,BIL/PANCR DUCTS N/A 11/29/2016    Procedure: ERCP; W/ENDOSCOPIC RETROGRADE REMOVAL OF CALCULUS/CALCULI FROM BILIARY &/OR PANCREATIC DUCTS;  Surgeon: Chriss Driver, MD;  Location: GI PROCEDURES [facility];  Service: Gastroenterology    PR ERCP,W/REMOVAL STONE,BIL/PANCR DUCTS N/A 02/10/2017    Procedure: ERCP; W/ENDOSCOPIC RETROGRADE REMOVAL OF CALCULUS/CALCULI FROM BILIARY &/OR PANCREATIC DUCTS;  Surgeon: Mayford Knife, MD;  Location: GI PROCEDURES [facility];  Service: Gastroenterology    PR ERCP,W/REMOVAL STONE,BIL/PANCR DUCTS N/A 04/26/2017    Procedure:  ERCP; W/ENDOSCOPIC RETROGRADE REMOVAL OF CALCULUS/CALCULI FROM BILIARY &/OR PANCREATIC DUCTS;  Surgeon: Mayford Knife, MD;  Location: GI PROCEDURES [facility];  Service: Gastroenterology    PR ERCP,W/REMOVAL STONE,BIL/PANCR DUCTS N/A 06/29/2017    Procedure: ERCP; W/ENDOSCOPIC RETROGRADE REMOVAL OF CALCULUS/CALCULI FROM BILIARY &/OR PANCREATIC DUCTS;  Surgeon: Vonda Antigua, MD;  Location: [facility] PROC[facility]URES [facility];  Service: Gastroenterology    PR ESOPHAGOSCP RIG TRANSORAL HYPOPHARYNX CRV ESOPH N/A 04/04/2018    Procedure: ESOPHAGOSCOPY, RIGID, TRANSORAL WITH DIVERTICULECTOMY OF HYPOPHARYNX OR CERVICAL ESOPHAGUS, WITH CRICOPHARYNGEAL MYOTOMY, INCLUDES USE OF TELESCOPE OR OPERATING MICROSCOPE AND REPAIR, WHEN PERFORMED;  Surgeon: Vista Deck, MD;  Location: MAIN OR [facility];  Service: ENT    PR INSER HEART TEMP PACER ONE CHMBR N/A 10/02/2016    Procedure: Tempoarary Pacemaker Insertion;  Surgeon: Meredith Leeds, MD;  Location: [facility];  Service: Cardiology    PR LAP,DIAGNOSTIC ABDOMEN N/A 01/22/2016    Procedure: Laparoscopy, Location: GI PROCEDURES [facility]IAL [facility];  Service: Gastroenterology    PR ERCP,W/REMOVAL STONE,BIL/PANCR DUCTS N/A 06/29/2017    Procedure: ERCP; W/ENDOSCOPIC RETROGRADE REMOVAL OF CALCULUS/CALCULI FROM BILIARY &/OR PANCREATIC DUCTS;  Surgeon: Vonda Antigua, MD;  Location: GI PROCEDURES MEMORIAL [facility];  Service: Gastroenterology    PR ESOPHAGOSCP RIG TRANSORAL HYPOPHARYNX CRV ESOPH N/A 04/04/2018    Procedure: ESOPHAGOSCOPY, RIGID, TRANSORAL WITH DIVERTICULECTOMY OF HYPOPHARYNX OR CERVICAL ESOPHAGUS, WITH CRICOPHARYNGEAL MYOTOMY, INCLUDES USE OF TELESCOPE OR OPERATING MICROSCOPE AND REPAIR, WHEN PERFORMED;  Surgeon: Vista Deck, MD;  Location: MAIN OR [facility];  Service: ENT    PR INSER HEART TEMP PACER ONE CHMBR N/A 10/02/2016    Procedure: Tempoarary Pacemaker Insertion;  Surgeon: Meredith Leeds, MD;  Location: [facility] EP;  Service: Cardiology    PR LAP,DIAGNOSTIC ABDOMEN N/A 01/22/2016    Procedure: Laparoscopy, Abdomen, Peritoneum, & Omentum, Diagnostic, W/Wo Collection Specimen(S) By Brushing Or Washing;  Surgeon: Particia Nearing, MD;  Location: MAIN OR [facility];  Service: Transplant    PR LARYNGOPLASTY MEDIALIZATION Neysa Bonito N/A 11/07/2018    Procedure: R31 LARYNGOPLASTY, MEDIALIZATION, UNILATERAL;  Surgeon: Vista Deck, MD;  Location: MAIN OR [facility];  Service: ENT    PR PLACE PERCUT GASTROSTOMY TUBE N/A 11/17/2016    Procedure: UGI ENDO; W/DIRECTED PLCMT PERQ GASTROSTOMY TUBE;  Surgeon: Cletis Athens, MD;  Location: GI PROCEDURES MEM[facility]IAL [facility];  Service: Gastroenterology    PR TRACHEOSTOMY, PLANNED N/A 09/29/2016    Procedure: TRACHEOSTOMY PLANNED (SEPART PROC);  Surgeon: Katherina Mires, MD;  Location: MAIN OR [facility];  Service: Trauma    PR TRANSCATH INSERT OR REPLACE LEADLESS PM VENTR N/A 10/11/2016    Procedure: Pacemaker Implant/Replace Leadless;  Surgeon: Meredith Leeds, MD;  Location: [facility] EP;  Service: Cardiology    PR TRANSPLANT LIVER,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    Procedure: Liver Allotransplantation; Orthotopic, Partial Or Whole, From Cadaver Or Living Donor, Any Age;  Surgeon: Doyce Loose, MD;  Location: MAIN OR [facility];  Service: Transplant    PR TRANSPLANT,PREP DONOR LIVER, WHOLE N/A 09/15/2016    Procedure: Rogelia Boga Std Prep Cad Donor Whole Liver Gft Prior Tnsplnt,Inc Chole,Diss/Rem Surr Tissu Wo Triseg/Lobe Splt;  Surgeon: Doyce Loose, MD;  Location: MAIN OR [facility];  Service: Transplant    PR UPPER GI ENDOSCOPY,BIOPSY N/A 07/17/2012    Procedure: UGI ENDOSCOPY; WITH BIOPSY, SINGLE OR MULTIPLE;  Surgeon: Alba Destine, MD;  Location: GI PROC[facility]URES [facility];  Service: Gastroenterology    PR UPPER GI ENDOSCOPY,DIAGNOSIS N/A 02/04/2014    Procedure: UGI ENDO, INCLUDE  ESOPHAGUS, STOMACH, & DUODENUM &/OR JEJUNUM; DX W/WO COLLECTION SPECIMN, BY BRUSH OR WASH;  Surgeon: Wilburt Finlay, MD;  Location: GI PROCEDURES [facility];  Service: Gastroenterology    PR UPPER GI ENDOSCOPY,LIGAT VARIX N/A 11/05/2013    Procedure: UGI ENDO; Everlene Balls LIG ESOPH &/OR GASTRIC VARICES;  Surgeon: Wilburt Finlay, MD;  Location: [facility] PROC[facility]URES [facility];  Service: Gastroenterology

## 2021-12-13 NOTE — Unmapped (Signed)
Your device function is normal today.      [facility] EP Patient Education Gallery    [email or web address]            Living With Atrial Fibrillation Patient Guide             For general questions or concerns, please call the DEVICE CLINIC during normal business hours. If you are having a medical emergency, go to the nearest emergency room or call 911.  For ICDs only  If you receive ONE shock If you receive ONE shock If you receive TWO or MORE shocks   You have no symptoms You have symptoms:    Chest pain/pressure  Shortness of breath  Rapid heart action  Dizziness  Generally don't feel well    Call your heart doctor to discuss the shock and arrange for appropriate follow-up Call 911. Follow-up with a call to your doctor. Call 911 immediately. Follow-up with a call to your heart doctor.           For Device Related Questions please contact the Device Clinic [phone] (Monday-Friday 8:00-4:30p.m)    To schedule or change an appointment with Device Clinic call: [phone]    For technical assistance with home monitors:  Please call your home monitoring company    [employer] [phone]  [employer] [phone]  [employer]  [phone][employer] [place]) [phone]  Future Appointments   Date Time Provider Department Center   02/15/2022  2:30 PM Viprakasit, Reine Just, MD [facility]   02/17/2022  3:00 PM Antipenko, Reita Cliche, MD [facility]   06/07/2022  1:30 PM Jettie Booze, MD [facility]

## 2021-12-20 MED ORDER — INSULIN GLARGINE (U-100) 100 UNIT/ML (3 ML) SUBCUTANEOUS PEN
Freq: Two times a day (BID) | SUBCUTANEOUS | 3 refills | 54 days
Start: 2021-12-20 — End: ?

## 2021-12-20 MED FILL — BASAGLAR KWIKPEN U-100 INSULIN 100 UNIT/ML (3 ML) SUBCUTANEOUS: SUBCUTANEOUS | 54 days supply | Qty: 15 | Fill #0

## 2021-12-21 MED ORDER — PEN NEEDLE, DIABETIC 32 GAUGE X 5/32" (4 MM)
1 refills | 0.00000 days
Start: 2021-12-21 — End: ?

## 2021-12-21 NOTE — Unmapped (Addendum)
[facility] Clinical Assessment & Refill Coordination Note    Jeffrey Ward, DOB: 31-Jul-1950  Phone: [phone] (home)     All above HIPAA information was verified with patient.     Was a Nurse, learning disability used for this call? 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 BASAGLAR KWIKPEN U-100 INSULIN 100 unit/mL (3 mL) injection pen Inject 14 Units subcutaneously at bedtime 15 mL 4   ??? bisacodyL (DULCOLAX) 5 mg EC tablet Take 1 tablet (5 mg total) by mouth daily as needed.     ??? blood-glucose meter (GLUCOSE MONITORING KIT) kit Use as instructed 1 each 0   ??? cholecalciferol, vitamin D3, 125 mcg (5,000 unit) tablet Take 1 tablet (125 mcg total) by mouth daily.     ??? cycloSPORINE modified (NEORAL) 100 MG capsule Take 2 capsules (200 mg total) by mouth daily AND 1 capsule (100 mg total) nightly. 270 capsule 3   ??? cycloSPORINE modified (NEORAL) 25 MG capsule Take 3 capsules (75 mg total) by mouth nightly (with a 100mg  capsule). For a total dose of 200 mg in the morning , 175mg  every evening 270 capsule 3   ??? DULoxetine (CYMBALTA) 30 MG capsule Take 2 capsules (60 mg total) by mouth nightly. 60 capsule 5   ??? insulin glargine (BASAGLAR, LANTUS) 100 unit/mL (3 mL) injection pen Inject 0.14 mL (14 Units total) under the skin two (2) times a day. 15 mL 3   ??? magnesium oxide-Mg AA chelate (MAGNESIUM, AMINO ACID CHELATE,) 133 mg Take 2 tablets by mouth Two (2) times a day. 120 tablet PRN   ??? meclizine (ANTIVERT) 12.5 mg tablet Take 1 tablet (12.5 mg total) by mouth Three (3) times a day as needed for dizziness.     ??? melatonin 3 mg Tab Take 1 tablet (3 mg total) by mouth nightly. 30 tablet 0   ??? metoprolol succinate (TOPROL-XL) 25 MG 24 hr tablet Take 1 tablet (25 mg total) by mouth.     ??? mirtazapine (REMERON) 15 MG tablet Take 1 tablet (15 mg total) by mouth nightly. 90 tablet 3   ??? mycophenolate (CELLCEPT) 250 mg capsule Take 2 capsules (500 mg total) by mouth two (2) times a day. 120 capsule 11   ??? pen needle, diabetic (ULTICARE PEN NEEDLE) 32 gauge x 5/32 (4 mm) Ndle USE WITH INSULIN PEN 4 TIMES DAILY AS DIRECTED 100 each 1   ??? pen needle, diabetic (ULTICARE PEN NEEDLE) 32 gauge x 5/32 (4 mm) Ndle Use as directed four times daily 100 each 1   ??? pregabalin (LYRICA) 100 MG capsule Take 1 capsule (100 mg total) by mouth Two (2) times a day. 60 capsule 5   ??? pregabalin (LYRICA) 100 MG capsule Take 1 capsule (100 mg total) by mouth Two (2) times a day. 60 capsule 5   ??? pregabalin (LYRICA) 75 MG capsule Take 1 capsule (75 mg total) by mouth Two (2) times a day. 60 capsule 6   ??? rosuvastatin (CRESTOR) 5 MG tablet Take 1 tablet (5 mg total) by mouth daily. 90 tablet 3   ??? sildenafiL, pulm.hypertension, (REVATIO) 20 mg tablet Take 2 tablets (40 mg total) by mouth Three (3) times a day. 180 tablet 6   ??? tamsulosin (FLOMAX) 0.4 mg capsule Take 1 capsule (0.4 mg total) by mouth daily. 90 capsule  0     No current facility-administered medications for this visit.        Changes to medications: [name] reports starting the following medications: meclizine    No Known Allergies    Changes to allergies: No    SPECIALTY MEDICATION ADHERENCE     Mycophenolate 250mg   : 12 days of medicine on hand   Cyclosporine 100mg   : 12 days of medicine on hand   Cyclosporine 25mg   : 12 days of medicine on hand       Medication Adherence    Patient reported X missed doses in the last month: 0  Specialty Medication: cyclosporine 100mg   Patient is on additional specialty medications: Yes  Additional Specialty Medications: Cyclosporine 25mg   Patient Reported Additional Medication X Missed Doses in the Last Month: 0  Patient is on more than two specialty medications: Yes  Specialty Medication: mycophenolate 250mg   Patient Reported Additional Medication X Missed Doses in the Last Month: 0 Support network for adherence: family member              Specialty medication(s) dose(s) confirmed: Regimen is correct and unchanged.     Are there any concerns with adherence? No    Adherence counseling provided? Not needed    CLINICAL MANAGEMENT AND INTERVENTION      Clinical Benefit Assessment:    Do you feel the medicine is effective or helping your condition? Yes    Clinical Benefit counseling provided? Not needed    Adverse Effects Assessment:    Are you experiencing any side effects? No    Are you experiencing difficulty administering your medicine? No    Quality of Life Assessment:    Quality of Life    Rheumatology  Oncology  Dermatology  Cystic Fibrosis          How many days over the past month did your transplant  keep you from your normal activities? For example, brushing your teeth or getting up in the morning. 0    Have you discussed this with your provider? Not needed    Acute Infection Status:    Acute infections noted within Epic:  No active infections  Patient reported infection: None    Therapy Appropriateness:    Is therapy appropriate and patient progressing towards therapeutic goals? Yes, therapy is appropriate and should be continued    DISEASE/MEDICATION-SPECIFIC INFORMATION      N/A    Solid Organ Transplant: Not Applicable    PATIENT SPECIFIC NEEDS     - Does the patient have any physical, cognitive, or cultural barriers? No    - Is the patient high risk? Yes, patient is taking a REMS drug. Medication is dispensed in compliance with REMS program    - Did the patient require a clinical intervention? No    - Does the patient require physician intervention or other additional services (i.e., nutrition, smoking cessation, social work)? No    SOCIAL DETERMINANTS OF HEALTH     At the [facility], we have learned that life circumstances - like trouble affording food, housing, utilities, or transportation can affect the health of many of our patients.   That is why we wanted to ask: are you currently experiencing any life circumstances that are negatively impacting your health and/or quality of life? No    Social Determinants of Psychologist, prison and probation services Strain: Not on file   Internet Connectivity: Not on file   Food Insecurity: Not on file  Tobacco Use: Low Risk  (12/13/2021)    Patient History    ??? Smoking Tobacco Use: Never    ??? Smokeless Tobacco Use: Never    ??? Passive Exposure: Not on file   Housing/Utilities: Not on file   Alcohol Use: Not on file   Transportation Needs: Not on file   Substance Use: Not on file   Health Literacy: Not on file   Physical Activity: Not on file   Interpersonal Safety: Not on file   Stress: Not on file   Intimate Partner Violence: Not on file   Depression: Not on file   Social Connections: Not on file       Would you be willing to receive help with any of the needs that you have identified today? Not applicable       SHIPPING     Specialty Medication(s) to be Shipped:   Transplant: mycophenolate mofetil 250mg , cyclosporine 100mg  and cyclosporine 25mg     Other medication(s) to be shipped: revatio, crestor, lyrica, pen needle, mgplus     Changes to insurance: No    Delivery Scheduled: Yes, Expected medication delivery date: 12/29/2021.  However, Rx request for refills was sent to the provider as there are none remaining.     Medication will be delivered via Next Day Courier to the confirmed prescription address in [facility].    The patient will receive a drug information handout for each medication shipped and additional FDA Medication Guides as required.  Verified that patient has previously received a Conservation officer, historic buildings and a Surveyor, mining.    The patient or caregiver noted above participated in the development of this care plan and knows that they can request review of or adjustments to the care plan at any time.      All of the patient's questions and concerns have been addressed.    Thad Ranger, PharmD   [facility] Specialty Pharmacist

## 2021-12-28 MED FILL — ULTICARE PEN NEEDLE 32 GAUGE X 5/32" (4 MM): 50 days supply | Qty: 100 | Fill #0

## 2021-12-28 MED FILL — MYCOPHENOLATE MOFETIL 250 MG CAPSULE: ORAL | 30 days supply | Qty: 120 | Fill #11

## 2021-12-28 MED FILL — DULOXETINE 30 MG CAPSULE,DELAYED RELEASE: ORAL | 30 days supply | Qty: 60 | Fill #1

## 2021-12-28 MED FILL — PREGABALIN 100 MG CAPSULE: ORAL | 30 days supply | Qty: 60 | Fill #2

## 2021-12-28 MED FILL — CYCLOSPORINE MODIFIED 25 MG CAPSULE: ORAL | 30 days supply | Qty: 90 | Fill #8

## 2021-12-28 MED FILL — ROSUVASTATIN 5 MG TABLET: ORAL | 90 days supply | Qty: 90 | Fill #1

## 2021-12-28 MED FILL — MG-PLUS-PROTEIN 133 MG TABLET: ORAL | 30 days supply | Qty: 120 | Fill #9

## 2021-12-28 MED FILL — SILDENAFIL (PULMONARY HYPERTENSION) 20 MG TABLET: ORAL | 30 days supply | Qty: 180 | Fill #1

## 2021-12-28 MED FILL — CYCLOSPORINE MODIFIED 100 MG CAPSULE: ORAL | 30 days supply | Qty: 90 | Fill #8

## 2022-01-21 MED ORDER — BASAGLAR KWIKPEN U-100 INSULIN 100 UNIT/ML (3 ML) SUBCUTANEOUS
Freq: Every evening | SUBCUTANEOUS | 4 refills | 107.00000 days | Status: CN
Start: 2022-01-21 — End: ?

## 2022-01-26 NOTE — Unmapped (Signed)
11/15 - Contacted pt to schedule next annual liver txp follow up, per call pt okay with afternoon appt at [facility] clinic with provider and will do local labs prior to appt. Pt okay with appt letter by mail and verbalized understanding all discussed.

## 2022-01-27 DIAGNOSIS — Z79899 Other long term (current) drug therapy: Principal | ICD-10-CM

## 2022-01-27 DIAGNOSIS — Z944 Liver transplant status: Principal | ICD-10-CM

## 2022-01-27 MED ORDER — MYCOPHENOLATE MOFETIL 250 MG CAPSULE
ORAL_CAPSULE | Freq: Two times a day (BID) | ORAL | 11 refills | 30 days | Status: CP
Start: 2022-01-27 — End: 2023-01-27
  Filled 2022-02-01: qty 120, 30d supply, fill #0

## 2022-01-27 NOTE — Unmapped (Signed)
Pt request for RX Refill

## 2022-01-27 NOTE — Unmapped (Signed)
[facility] Refill Coordination Note    Specialty Medication(s) to be Shipped:   Transplant: mycophenolate mofetil 250mg , cyclosporine 25mg , and cyclosporine 100mg     Other medication(s) to be shipped: magnesium,pregabalin100mg ,  sildenafil , Duloxetine   cymbalta 30mg      Jeffrey Ward, DOB: Dec 28, 1950  Phone: [phone] (home)       All above HIPAA information was verified with patient.     Was a Nurse, learning disability used for this call? No    Completed refill call assessment today to schedule patient's medication shipment from the [facility] [phone]).  All relevant notes have been reviewed.     Specialty medication(s) and dose(s) confirmed: Regimen is correct and unchanged.   Changes to medications: Jeffrey Ward reports no changes at this time.  Changes to insurance: No  New side effects reported not previously addressed with a pharmacist or physician: None reported  Questions for the pharmacist: No    Confirmed patient received a Conservation officer, historic buildings and a Surveyor, mining with first shipment. The patient will receive a drug information handout for each medication shipped and additional FDA Medication Guides as required.       DISEASE/MEDICATION-SPECIFIC INFORMATION        N/A    SPECIALTY MEDICATION ADHERENCE     Medication Adherence    Patient reported X missed doses in the last month: 0  Specialty Medication: cycloSPORINE modified 100 MG capsule (NeoraL)  Patient is on additional specialty medications: Yes  Additional Specialty Medications: cycloSPORINE modified 25 MG capsule (NeoraL)  Patient Reported Additional Medication X Missed Doses in the Last Month: 0  Patient is on more than two specialty medications: Yes  Specialty Medication: mycophenolate 250 mg capsule (CELLCEPT)  Patient Reported Additional Medication X Missed Doses in the Last Month: 0  Any gaps in refill history greater than 2 weeks in the last 3 months: no  Demonstrates understanding of importance of adherence: yes          Support network for adherence: family member                  Were doses missed due to medication being on hold? No    mycophenolate 250  mg: 5-6  days of medicine on hand   cycloSPORINE modified 25  mg: 5-6  days of medicine on hand   cycloSPORINE modified 100 mg: 5-6  days of medicine on hand     REFERRAL TO PHARMACIST     Referral to the pharmacist: Not needed      [facility]     Shipping address confirmed in Epic.     Delivery Scheduled: Yes, Expected medication delivery date: 02/01/22 .  However, Rx request for refills was sent to the provider as there are none remaining.     Medication will be delivered via Same Day Courier to the prescription address in Epic W[facility].    Ricci Barker   [facility] Specialty Technician

## 2022-02-01 MED FILL — MG-PLUS-PROTEIN 133 MG TABLET: ORAL | 30 days supply | Qty: 120 | Fill #10

## 2022-02-01 MED FILL — DULOXETINE 30 MG CAPSULE,DELAYED RELEASE: ORAL | 30 days supply | Qty: 60 | Fill #2

## 2022-02-01 MED FILL — CYCLOSPORINE MODIFIED 100 MG CAPSULE: ORAL | 30 days supply | Qty: 90 | Fill #9

## 2022-02-01 MED FILL — PREGABALIN 100 MG CAPSULE: ORAL | 30 days supply | Qty: 60 | Fill #3

## 2022-02-01 MED FILL — SILDENAFIL (PULMONARY HYPERTENSION) 20 MG TABLET: ORAL | 30 days supply | Qty: 180 | Fill #2

## 2022-02-01 MED FILL — CYCLOSPORINE MODIFIED 25 MG CAPSULE: ORAL | 30 days supply | Qty: 90 | Fill #9

## 2022-02-10 ENCOUNTER — Other Ambulatory Visit: Payer: Self-pay | Admitting: Internal Medicine

## 2022-02-10 DIAGNOSIS — R413 Other amnesia: Secondary | ICD-10-CM

## 2022-02-10 DIAGNOSIS — Z944 Liver transplant status: Secondary | ICD-10-CM

## 2022-02-10 DIAGNOSIS — H8110 Benign paroxysmal vertigo, unspecified ear: Secondary | ICD-10-CM

## 2022-02-14 NOTE — Unmapped (Unsigned)
ERROR earlier in the day to decrease nocturia at night.     I strongly encouraged the patient to work with his endocrinology team in managing his Type 2 diabetes.  The patient will follow-up in approximately 6 to 8 months to reassess symptoms. We will consider medical therapy if symptoms persist despite well controlled diabetes. We will be happy to see him back anytime sooner if symptoms arise.    [name], MD  [address]  [facility] [place],  [place] [address]    CHIEF COMPLAINT: History of BPH and bothersome lower urinary tract symptoms here for follow-up    BRIEF HISTORY OF PRESENT ILLNESS:  Jeffrey Ward is very pleasant 71 y.o. male who returns for the evaluation of bothersome lower urinary tract symptoms and BPH. The patient notes urinary complaints for a prolonged amount of time. He has previously been maintained on medication therapy including Tamsulosin 0.4 mg at one point. His primary urinary complaints include intermittency, frequency/urgency, weak urinary stream, and nocturia 8-10x that have worsened over the years. He feels incomplete bladder emptying with small voids. He has severe urge incontinence. Urinary symptoms started since he injured his back in 2020.  The patient reports a history of pyelonephritis and is not on any medical treatment for this. He is followed by nephrology. Patient denies history of nephrolithiasis. He also complains of erectile dysfunction.     Since he was last seen,          He has poorly controlled Type 2 diabetes and is partially compliant with Insulin. The patient denies a history of dysuria, hematuria or urinary tract infection.       He has never had a prostate biopsy in the past. He has no family history of prostate cancer. He does not have a history of sleep apnea or significant snoring. The patient has no history of fevers, chills, nausea, vomiting, chest pain or shortness of breath.     PAST MEDICAL HISTORY:     Past Medical History:   Diagnosis Date Atrial fibrillation (CMS-HCC)     Basal cell carcinoma     Cancer (CMS-HCC)     Cirrhosis (CMS-HCC)     Depression     Diabetes (CMS-HCC)     Headache     Hepatitis C 07/17/2012    History of transfusion     Liver disease     Low back pain 07/17/2012    Varices, esophageal (CMS-HCC)        PAST SURGICAL HISTORY:   Past Surgical History:   Procedure Laterality Date    ANKLE SURGERY      BACK SURGERY      BACK SURGERY      CARDIAC SURGERY      pacemaker    CHG US GUIDE, TISSUE ABLATION N/A 01/22/2016    Procedure: ULTRASOUND GUIDANCE FOR, AND MONITORING OF, PARENCHYMAL TISSUE ABLATION;  Surgeon: Particia Nearing, MD;  Location: MAIN OR [place];  Service: Transplant    IR EMBOLIZATION ORGAN ISCHEMIA, TUMORS, INFAR  06/16/2016    IR EMBOLIZATION ORGAN ISCHEMIA, TUMORS, INFAR 06/16/2016 Ammie Dalton, MD IMG VIR [facility]    PR COLON CA SCRN NOT HI RSK IND N/A 02/27/2015    Procedure: COLOREC CNCR SCR;COLNSCPY NO;  Surgeon: Vonda Antigua, MD;  [facility];  Service: Gastroenterology    PR ENDOSCOPIC ULTRASOUND EXAM N/A 02/27/2015    Procedure: UGI ENDO; W/ENDO ULTRASOUND EXAM INCLUDES ESOPHAGUS, STOMACH, &/OR DUODENUM/JEJUNUM;  Surgeon: Vonda Antigua, MD;  Location: [facility];  Service: Gastroenterology    PR ERCP BALLOON DILATE BILIARY/PANC DUCT/AMPULLA EA N/A 02/10/2017    Procedure: ERCP;WITH TRANS-ENDOSCOPIC BALLOON DILATION OF BILIARY/PANCREATIC DUCT(S) OR OF AMPULLA, INCLUDING SPHINCTERECTOMY, WHEN PERFOREMD,EACH DUCT ([address]);  Surgeon: Mayford Knife, MD;  Location: [facility];  Service: Gastroenterology    PR ERCP REMOVE FOREIGN BODY/STENT BILIARY/PANC DUCT N/A 02/10/2017    Procedure: ENDOSCOPIC RETROGRADE CHOLANGIOPANCREATOGRAPHY (ERCP); W/ REMOVAL OF FOREIGN BODY/STENT FROM BILIARY/PANCREATIC DUCT(S);  Surgeon: Mayford Knife, MD;  Location: [facility]URES [facility];  Service: Gastroenterology    PR ERCP REMOVE FOREIGN BODY/STENT BILIARY/PANC DUCT N/A 06/29/2017    Procedure: ENDOSCOPIC RETROGRADE CHOLANGIOPANCREATOGRAPHY (ERCP); W/ REMOVAL OF FOREIGN BODY/STENT FROM BILIARY/PANCREATIC DUCT(S);  Surgeon: Vonda Antigua, MD;  Location: [facility];  Service: Gastroenterology    PR ERCP STENT PLACEMENT BILIARY/PANCREATIC DUCT N/A 11/29/2016    Procedure: ENDOSCOPIC RETROGRADE CHOLANGIOPANCREATOGRAPHY (ERCP); WITH PLACEMENT OF ENDOSCOPIC STENT INTO BILIARY OR PANCREATIC DUCT;  Surgeon: Chriss Driver, MD;  Location: [facility]URES [facility];  Service: Gastroenterology    PR ERCP STENT PLACEMENT BILIARY/PANCREATIC DUCT N/A 04/26/2017    Procedure: ENDOSCOPIC RETROGRADE CHOLANGIOPANCREATOGRAPHY (ERCP); WITH PLACEMENT OF ENDOSCOPIC STENT INTO BILIARY OR PANCREATIC DUCT;  Surgeon: Mayford Knife, MD;  Location: GI [facility]URES [facility];  Service: Gastroenterology    PR ERCP,W/REMOVAL STONE,BIL/PANCR DUCTS N/A 11/29/2016    Procedure: ERCP; W/ENDOSCOPIC RETROGRADE REMOVAL OF CALCULUS/CALCULI FROM BILIARY &/OR PANCREATIC DUCTS;  Surgeon: Chriss Driver, MD;  Location: [facility];  Service: Gastroenterology    PR ERCP,W/REMOVAL STONE,BIL/PANCR DUCTS N/A 02/10/2017    Procedure: ERCP; W/ENDOSCOPIC RETROGRADE REMOVAL OF CALCULUS/CALCULI FROM BILIARY &/OR PANCREATIC DUCTS;  Surgeon: Mayford Knife, MD;  Location: [facility]URES [facility];  Service: Gastroenterology    PR ERCP,W/REMOVAL STONE,BIL/PANCR DUCTS N/A 04/26/2017    Procedure: ERCP; W/ENDOSCOPIC RETROGRADE REMOVAL OF CALCULUS/CALCULI FROM BILIARY &/OR PANCREATIC DUCTS;  Surgeon: Mayford Knife, MD;  Location: GI PROC[facility]URES [facility];  Service: Gastroenterology    PR ERCP,W/REMOVAL STONE,BIL/PANCR DUCTS N/A 06/29/2017    Procedure: ERCP; W/ENDOSCOPIC RETROGRADE REMOVAL OF CALCULUS/CALCULI FROM BILIARY &/OR PANCREATIC DUCTS;  Surgeon: Vonda Antigua, MD;  Location: [facility]URES [facility];  Service: Gastroenterology    PR ESOPHAGOSCP RIG TRANSORAL HYPOPHARYNX CRV ESOPH N/A 04/04/2018    Procedure: ESOPHAGOSCOPY, RIGID, TRANSORAL WITH DIVERTICULECTOMY OF HYPOPHARYNX OR CERVICAL ESOPHAGUS, WITH CRICOPHARYNGEAL MYOTOMY, INCLUDES USE OF TELESCOPE OR OPERATING MICROSCOPE AND REPAIR, WHEN PERFORMED;  Surgeon: Vista Deck, MD;  Location: MAIN OR [facility];  Service: ENT    PR INSER HEART TEMP PACER ONE CHMBR N/A 10/02/2016    Procedure: Tempoarary Pacemaker Insertion;  Surgeon: Meredith Leeds, MD;  Location: [facility];  Service: Cardiology    PR LAP,DIAGNOSTIC ABDOMEN N/A 01/22/2016    Procedure: Laparoscopy, Abdomen, Peritoneum, & Omentum, Diagnostic, W/Wo Collection Specimen(S) By Brushing Or Washing;  Surgeon: Particia Nearing, MD;  Location: MAIN OR [facility];  Service: Transplant    PR LARYNGOPLASTY MEDIALIZATION Neysa Bonito N/A 11/07/2018    Procedure: R31 LARYNGOPLASTY, MEDIALIZATION, UNILATERAL;  Surgeon: Vista Deck, MD;  Location: MAIN OR [facility];  Service: ENT    PR PLACE PERCUT GASTROSTOMY TUBE N/A 11/17/2016    Procedure: UGI ENDO; W/DIRECTED PLCMT PERQ GASTROSTOMY TUBE;  Surgeon: Cletis Athens, MD;  Location: GI [facility]URES [facility];  Service: Gastroenterology    PR TRACHEOSTOMY, PLANNED N/A 09/29/2016    Procedure: TRACHEOSTOMY PLANNED (SEPART PROC);  Surgeon: Katherina Mires, MD;  Location: MAIN OR [facility];  Service: Trauma    PR TRANSCATH INSERT OR REPLACE LEADLESS PM VENTR N/A 10/11/2016    Procedure: Pacemaker Implant/Replace Leadless;  Surgeon: Meredith Leeds, MD;  Location: [facility];  Service: Cardiology    PR TRANSPLANT LIVER,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    Procedure: Liver Allotransplantation; Orthotopic, Partial Or Whole, From Cadaver Or Living Donor, Any Age;  Surgeon: Doyce Loose, MD;  Location: MAIN OR [facility];  Service: Transplant    PR TRANSPLANT,PREP DONOR LIVER, WHOLE N/A 09/15/2016    Procedure: Rogelia Boga Std Prep Cad Donor Whole Liver Gft Prior Tnsplnt,Inc Chole,Diss/Rem Surr Tissu Wo Triseg/Lobe Splt;  Surgeon: Doyce Loose, MD;  Location: MAIN OR [facility];  Service: Transplant    PR UPPER GI ENDOSCOPY,BIOPSY N/A 07/17/2012    Procedure: UGI ENDOSCOPY; WITH BIOPSY, SINGLE OR MULTIPLE;  Surgeon: Alba Destine, MD;  Location: [facility];  Service: Gastroenterology    PR UPPER GI ENDOSCOPY,DIAGNOSIS N/A 02/04/2014    Procedure: UGI ENDO, INCLUDE ESOPHAGUS, STOMACH, & DUODENUM &/OR JEJUNUM; DX W/WO COLLECTION SPECIMN, BY BRUSH OR WASH;  Surgeon: Wilburt Finlay, MD;  Location: [facility]URES [facility];  Service: Gastroenterology    PR UPPER GI ENDOSCOPY,LIGAT VARIX N/A 11/05/2013    Procedure: UGI ENDO; Everlene Balls LIG ESOPH &/OR GASTRIC VARICES;  Surgeon: Wilburt Finlay, MD;  Location: [facility];  Service: Gastroenterology       ALLERGIES:    has No Known Allergies.    MEDICATIONS:  Current Outpatient Medications   Medication Sig Dispense Refill    BASAGLAR KWIKPEN U-100 INSULIN 100 unit/mL (3 mL) injection pen Inject 14 Units subcutaneously at bedtime 15 mL 4    BASAGLAR KWIKPEN U-100 INSULIN 100 unit/mL (3 mL) injection pen Inject 0.14 mL (14 Units total) under the skin at bedtime. 15 mL 4    bisacodyL (DULCOLAX) 5 mg EC tablet Take 1 tablet (5 mg total) by mouth daily as needed.      blood-glucose meter (GLUCOSE MONITORING KIT) kit Use as instructed 1 each 0    cholecalciferol, vitamin D3, 125 mcg (5,000 unit) tablet Take 1 tablet (125 mcg total) by mouth daily.      cycloSPORINE modified (NEORAL) 100 MG capsule Take 2 capsules (200 mg total) by mouth daily AND 1 capsule (100 mg total) nightly. 270 capsule 3    cycloSPORINE modified (NEORAL) 25 MG capsule Take 3 capsules (75 mg total) by mouth nightly (with a 100mg  capsule). For a total dose of 200 mg in the morning , 175mg  every evening 270 capsule 3    DULoxetine (CYMBALTA) 30 MG capsule Take 2 capsules (60 mg total) by mouth nightly. 60 capsule 5    insulin glargine (BASAGLAR, LANTUS) 100 unit/mL (3 mL) injection pen Inject 0.14 mL (14 Units total) under the skin two (2) times a day. 15 mL 3    magnesium oxide-Mg AA chelate (MAGNESIUM, AMINO ACID CHELATE,) 133 mg Take 2 tablets by mouth Two (2) times a day. 120 tablet PRN    meclizine (ANTIVERT) 12.5 mg tablet Take 1 tablet (12.5 mg total) by mouth Three (3) times a day as needed for dizziness.      melatonin 3 mg Tab Take 1 tablet (3 mg total) by mouth nightly. 30 tablet 0    metoprolol succinate (TOPROL-XL) 25 MG 24 hr tablet Take 1 tablet (25 mg total) by mouth.      mirtazapine (REMERON) 15 MG tablet  Take 1 tablet (15 mg total) by mouth nightly. 90 tablet 3    mycophenolate (CELLCEPT) 250 mg capsule Take 2 capsules (500 mg total) by mouth two (2) times a day. 120 capsule 11    pen needle, diabetic (PEN NEEDLE) 32 gauge x 5/32 (4 mm) Ndle Use with insulin twice a day 200 each 1    pen needle, diabetic (ULTICARE PEN NEEDLE) 32 gauge x 5/32 (4 mm) Ndle USE WITH INSULIN PEN 4 TIMES DAILY AS DIRECTED 100 each 1    pregabalin (LYRICA) 100 MG capsule Take 1 capsule (100 mg total) by mouth Two (2) times a day. 60 capsule 5    pregabalin (LYRICA) 100 MG capsule Take 1 capsule (100 mg total) by mouth Two (2) times a day. 60 capsule 5    pregabalin (LYRICA) 75 MG capsule Take 1 capsule (75 mg total) by mouth Two (2) times a day. 60 capsule 6    rosuvastatin (CRESTOR) 5 MG tablet Take 1 tablet (5 mg total) by mouth daily. 90 tablet 3    sildenafiL, pulm.hypertension, (REVATIO) 20 mg tablet Take 2 tablets (40 mg total) by mouth Three (3) times a day. 180 tablet 6    tamsulosin (FLOMAX) 0.4 mg capsule Take 1 capsule (0.4 mg total) by mouth daily. 90 capsule 0     No current facility-administered medications for this visit.        SOCIAL HISTORY:  Social History     Tobacco Use    Smoking status: Never    Smokeless tobacco: Never    Tobacco comments:     Smoked in high school for about 5 years.    Substance Use Topics    Alcohol use: No    Drug use: No        FAMILY HISTORY:  Unknown for prostate cancer disease.  Family History   Problem Relation Age of Onset    Hypertension Mother     Cancer Mother     Heart disease Mother     Heart disease Father     Cirrhosis Neg Hx     Liver cancer Neg Hx     Anemia Neg Hx     Diabetes Neg Hx     Kidney disease Neg Hx     Obesity Neg Hx     Thyroid disease Neg Hx     Osteoporosis Neg Hx     Coronary artery disease Neg Hx     Anesthesia problems Neg Hx     Basal cell carcinoma Neg Hx     Squamous cell carcinoma Neg Hx        REVIEW OF SYSTEMS:  A 10-system review of systems was completed  by the patient and reviewed by me today.  All pertinent positives  were discussed in history of present illness.    PHYSICAL EXAMINATION:  VITAL SIGNS:  The patient is afebrile.  Vital signs are stable.  GENERAL:  In no apparent distress.  HEENT:  Eyes, ears and mouth appeared normal.  LYMPHATICS:  No cervical lymphadenopathy.  LUNGS:  Chest wall excursion symmetric bilaterally. No audible wheezing  ABDOMEN:  Soft, nontender, nondistended.  BACK:  Negative CVA tenderness.     DRE: Prostate mildly enlarged, smooth, soft no nodules  EXTREMITIES:  No lower extremity edema.  SKIN:  No rashes or jaundice.  NEUROLOGIC:  No focal deficits on neurologic exam.    LABORATORY TESTING:      Chemistry  Component Value Date/Time    NA 137 12/07/2021 1540    NA 141 09/26/2016 1640    K 5.1 (H) 12/07/2021 1540    K 4.1 09/26/2016 1640    CL 108 (H) 12/07/2021 1540    CL 106 09/25/2018 1715    CO2 23.0 12/07/2021 1540    CO2 29 10/10/2013 1417    BUN 31 (H) 12/07/2021 1540    BUN 34 (H) 09/25/2018 1715    CREATININE 2.21 (H) 12/07/2021 1540    CREATININE 1.0 09/25/2015 1042    GLU 268 (H) 12/07/2021 1540        Component Value Date/Time    CALCIUM 9.3 12/07/2021 1540    CALCIUM 9.3 10/10/2013 1417    ALKPHOS 75 05/17/2021 1307    ALKPHOS 214 (H) 01/09/2014 1029    AST 12 05/17/2021 1307    AST 46 01/09/2014 1029    ALT 11 05/17/2021 1307    ALT 41 01/09/2014 1029    BILITOT 0.6 05/17/2021 1307    BILITOT 0.5 12/12/2017 0830                  06/22/2021 AUA symptom score total of 35 with bother of 6 and a leakage of 4.  06/22/2021 postvoid residual 26 ml    02/15/2022 AUA SS ***  02/15/2022 PVR ***

## 2022-02-15 ENCOUNTER — Ambulatory Visit: Admit: 2022-02-15 | Discharge: 2022-02-16 | Payer: MEDICARE | Attending: Urology | Primary: Urology

## 2022-02-15 DIAGNOSIS — N400 Enlarged prostate without lower urinary tract symptoms: Principal | ICD-10-CM

## 2022-02-15 NOTE — Unmapped (Addendum)
Reviewed urinary symptoms and postvoid residual in clinic today  Discussed behavioral changes recommendations including:  1.  Timed voiding every 2-3 hours by the clock and not on sensation alone  2.  Double voiding-trying to urinate again shortly after going to see if you can empty more  3.  Continue good bowel regimen to minimize constipation episodes  4.  Shift fluid intake earlier in the day and less in the evening hours and at night to decrease nighttime voiding  5.  Minimizing high diuretic type fluids such as tea, soda, alcohol, coffee which will increase urination frequency  Continue Flomax medication  Reiterated importance of better diabetes and sugar control as this will exacerbate or cause worsening bladder and overactive symptoms  Return to clinic in 12 months for follow-up

## 2022-02-15 NOTE — Unmapped (Signed)
ANALYSIS AND PLAN:  This is a 71 y.o. 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 I reviewed with the patient today his urinary complaints, symptoms and evaluation in the clinic. I reviewed PSA values which have been within normal limits with last read in 2018. His postvoid residual in clinic 21 mL.  Although his symptoms have improved, I reiterated the importance of better diabetes and sugar control as this will exacerbate his bladder and overactive symptoms. The patient should continue on his current Flomax medication. We discussed the importance of behavioral modifications including timed and double voiding, avoiding constipation episodes, shifting fluid intake earlier in the day to decrease nocturia at night.     The patient will follow-up in approximately 12 months to reassess symptoms. We will consider medical therapy if symptoms persist despite well controlled diabetes. We will be happy to see him back anytime sooner if symptoms arise.      Jeffrey Sites, MD  [address]  [facility] [place],  [place] [address]    CHIEF COMPLAINT: History of BPH and bothersome lower urinary tract symptoms here for follow-up    BRIEF HISTORY OF PRESENT ILLNESS:  Jeffrey Ward is very pleasant 71 y.o. male who returns for the evaluation of bothersome lower urinary tract symptoms and BPH. The patient notes urinary complaints for a prolonged amount of time. He has previously been maintained on medication therapy including Tamsulosin 0.4 mg at one point. His primary urinary complaints include intermittency, frequency/urgency, weak urinary stream, and nocturia 8-10x that have worsened over the years. He feels incomplete bladder emptying with small voids. He has severe urge incontinence. Urinary symptoms started since he injured his back in 2020.  The patient reports a history of pyelonephritis and is not on any medical treatment for this. He is followed by nephrology. Patient denies history of nephrolithiasis. He also complains of erectile dysfunction. Since he was last seen, the patient has been overall well. He reports improvement in his urinary frequency and leakage. He believes that he has better control over his diabetes. The patient denies a history of dysuria, hematuria or urinary tract infection. He has never had a prostate biopsy in the past. He has no family history of prostate cancer. He does not have a history of sleep apnea or significant snoring. The patient has no history of fevers, chills, nausea, vomiting, chest pain or shortness of breath.     PAST MEDICAL HISTORY:     Past Medical History:   Diagnosis Date    Atrial fibrillation (CMS-HCC)     Basal cell carcinoma     Cancer (CMS-HCC)     Cirrhosis (CMS-HCC)     Depression     Diabetes (CMS-HCC)     Headache     Hepatitis C 07/17/2012    History of transfusion     Liver disease     Low back pain 07/17/2012    Varices, esophageal (CMS-HCC)        PAST SURGICAL HISTORY:   Past Surgical History:   Procedure Laterality Date    ANKLE SURGERY      BACK SURGERY      BACK SURGERY      CARDIAC  SURGERY      pacemaker    CHG US GUIDE, TISSUE ABLATION N/A 01/22/2016    Procedure: ULTRASOUND GUIDANCE FOR, AND MONITORING OF, PARENCHYMAL TISSUE ABLATION;  Surgeon: Particia Nearing, MD;  Location: MAIN OR [facility];  Service: Transplant    IR EMBOLIZATION ORGAN ISCHEMIA, TUMORS, INFAR  06/16/2016    IR EMBOLIZATION ORGAN ISCHEMIA, TUMORS, INFAR 06/16/2016 Ammie Dalton, MD IMG VIR H[facility]    PR COLON CA SCRN NOT HI RSK IND N/A 02/27/2015    Procedure: COLOREC CNCR SCR;COLNSCPY NO;  Surgeon: Vonda Antigua, MD;  Location: [facility];  Service: Gastroenterology    PR ENDOSCOPIC ULTRASOUND EXAM N/A 02/27/2015    Procedure: UGI ENDO; W/ENDO ULTRASOUND EXAM INCLUDES ESOPHAGUS, STOMACH, &/OR DUODENUM/JEJUNUM;  Surgeon: Vonda Antigua, MD;  Location: [facility];  Service: Gastroenterology    PR ERCP BALLOON DILATE BILIARY/PANC DUCT/AMPULLA EA N/A 02/10/2017    Procedure: ERCP;WITH TRANS-ENDOSCOPIC BALLOON DILATION OF BILIARY/PANCREATIC DUCT(S) OR OF AMPULLA, INCLUDING SPHINCTERECTOMY, WHEN PERFOREMD,EACH DUCT ([address]);  Surgeon: Mayford Knife, MD;  Location: GI PROC[facility]URES [facility];  Service: Gastroenterology    PR ERCP REMOVE FOREIGN BODY/STENT BILIARY/PANC DUCT N/A 02/10/2017    Procedure: ENDOSCOPIC RETROGRADE CHOLANGIOPANCREATOGRAPHY (ERCP); W/ REMOVAL OF FOREIGN BODY/STENT FROM BILIARY/PANCREATIC DUCT(S);  Surgeon: Mayford Knife, MD;  Location: GI PROCEDURES [facility];  Service: Gastroenterology    PR ERCP REMOVE FOREIGN BODY/STENT BILIARY/PANC DUCT N/A 06/29/2017    Procedure: ENDOSCOPIC RETROGRADE CHOLANGIOPANCREATOGRAPHY (ERCP); W/ REMOVAL OF FOREIGN BODY/STENT FROM BILIARY/PANCREATIC DUCT(S);  Surgeon: Vonda Antigua, MD;  Location: GI PROCEDURES [facility];  Service: Gastroenterology    PR ERCP STENT PLACEMENT BILIARY/PANCREATIC DUCT N/A 11/29/2016    Procedure: ENDOSCOPIC RETROGRADE CHOLANGIOPANCREATOGRAPHY (ERCP); WITH PLACEMENT OF ENDOSCOPIC STENT INTO BILIARY OR PANCREATIC DUCT;  Surgeon: Chriss Driver, MD;  Location: GI PROCEDURES [facility];  Service: Gastroenterology    PR ERCP STENT PLACEMENT BILIARY/PANCREATIC DUCT N/A 04/26/2017    Procedure: ENDOSCOPIC RETROGRADE CHOLANGIOPANCREATOGRAPHY (ERCP); WITH PLACEMENT OF ENDOSCOPIC STENT INTO BILIARY OR PANCREATIC DUCT;  Surgeon: Mayford Knife, MD;  Location: GI PROCEDURES [facility];  Service: Gastroenterology    PR ERCP,W/REMOVAL STONE,BIL/PANCR DUCTS N/A 11/29/2016    Procedure: ERCP; W/ENDOSCOPIC RETROGRADE REMOVAL OF CALCULUS/CALCULI FROM BILIARY &/OR PANCREATIC DUCTS;  Surgeon: Chriss Driver, MD;  Location: GI PROCEDURES [facility];  Service: Gastroenterology    PR ERCP,W/REMOVAL STONE,BIL/PANCR DUCTS N/A 02/10/2017    Procedure: ERCP; W/ENDOSCOPIC RETROGRADE REMOVAL OF CALCULUS/CALCULI FROM BILIARY &/OR PANCREATIC DUCTS;  Surgeon: Mayford Knife, MD;  Location: GI PROCEDURES MEMORIAL [facility];  Service: Gastroenterology    PR ERCP,W/REMOVAL STONE,BIL/PANCR DUCTS N/A 04/26/2017    Procedure: ERCP; W/ENDOSCOPIC RETROGRADE REMOVAL OF CALCULUS/CALCULI FROM BILIARY &/OR PANCREATIC DUCTS;  Surgeon: Mayford Knife, MD;  Location: GI PROCEDURES [facility];  Service: Gastroenterology    PR ERCP,W/REMOVAL STONE,BIL/PANCR DUCTS N/A 06/29/2017    Procedure: ERCP; W/ENDOSCOPIC RETROGRADE REMOVAL OF CALCULUS/CALCULI FROM BILIARY &/OR PANCREATIC DUCTS;  Surgeon: Vonda Antigua, MD;  Location: GI PROCEDURES MEMORIAL [facility];  Service: Gastroenterology    PR ESOPHAGOSCP RIG TRANSORAL HYPOPHARYNX CRV ESOPH N/A 04/04/2018    Procedure: ESOPHAGOSCOPY, RIGID, TRANSORAL WITH DIVERTICULECTOMY OF HYPOPHARYNX OR CERVICAL ESOPHAGUS, WITH CRICOPHARYNGEAL MYOTOMY, INCLUDES USE OF TELESCOPE OR OPERATING MICROSCOPE AND REPAIR, WHEN PERFORMED;  Surgeon: Vista Deck, MD;  Location: MAIN OR [facility];  Service: ENT    PR INSER HEART TEMP PACER ONE CHMBR N/A 10/02/2016    Procedure: Tempoarary Pacemaker Insertion;  Surgeon: Claris Gower  Kathi Ludwig, MD;  Location: [facility];  Service: Cardiology    PR LAP,DIAGNOSTIC ABDOMEN N/A 01/22/2016    Procedure: Laparoscopy, Abdomen, Peritoneum, & Omentum, Diagnostic, W/Wo Collection Specimen(S) By Brushing Or Washing;  Surgeon: Particia Nearing, MD;  Location: MAIN OR [facility];  Service: Transplant    PR LARYNGOPLASTY MEDIALIZATION Neysa Bonito N/A 11/07/2018    Procedure: R31 LARYNGOPLASTY, MEDIALIZATION, UNILATERAL;  Surgeon: Vista Deck, MD;  Location: MAIN OR [facility];  Service: ENT    PR PLACE PERCUT GASTROSTOMY TUBE N/A 11/17/2016    Procedure: UGI ENDO; W/DIRECTED PLCMT PERQ GASTROSTOMY TUBE;  Surgeon: Cletis Athens, MD;  Location: GI PROCEDURES [facility];  Service: Gastroenterology    PR TRACHEOSTOMY, PLANNED N/A 09/29/2016    Procedure: TRACHEOSTOMY PLANNED (SEPART PROC);  Surgeon: Katherina Mires, MD;  Location: MAIN OR [facility];  Service: Trauma    PR TRANSCATH INSERT OR REPLACE LEADLESS PM VENTR N/A 10/11/2016    Procedure: Pacemaker Implant/Replace Leadless;  Surgeon: Meredith Leeds, MD;  Location: [facility];  Service: Cardiology    PR TRANSPLANT LIVER,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    Procedure: Liver Allotransplantation; Orthotopic, Partial Or Whole, From Cadaver Or Living Donor, Any Age;  Surgeon: Doyce Loose, MD;  Location: MAIN OR [facility];  Service: Transplant    PR TRANSPLANT,PREP DONOR LIVER, WHOLE N/A 09/15/2016    Procedure: Rogelia Boga Std Prep Cad Donor Whole Liver Gft Prior Tnsplnt,Inc Chole,Diss/Rem Surr Tissu Wo Triseg/Lobe Splt;  Surgeon: Doyce Loose, MD;  Location: MAIN OR [facility];  Service: Transplant    PR UPPER GI ENDOSCOPY,BIOPSY N/A 07/17/2012    Procedure: UGI ENDOSCOPY; WITH BIOPSY, SINGLE OR MULTIPLE;  Surgeon: Alba Destine, MD;  Location: GI PROCEDURES [facility];  Service: Gastroenterology    PR UPPER GI ENDOSCOPY,DIAGNOSIS N/A 02/04/2014    Procedure: UGI ENDO, INCLUDE ESOPHAGUS, STOMACH, & DUODENUM &/OR JEJUNUM; DX W/WO COLLECTION SPECIMN, BY BRUSH OR WASH;  Surgeon: Wilburt Finlay, MD;  Location: [facility]URES [facility];  Service: Gastroenterology    PR UPPER GI ENDOSCOPY,LIGAT VARIX N/A 11/05/2013    Procedure: UGI ENDO; Everlene Balls LIG ESOPH &/OR GASTRIC VARICES;  Surgeon: Wilburt Finlay, MD;  Location: GI PROCEDURES [facility];  Service: Gastroenterology       ALLERGIES:    has No Known Allergies.    MEDICATIONS:  Current Outpatient Medications   Medication Sig Dispense Refill    BASAGLAR KWIKPEN U-100 INSULIN 100 unit/mL (3 mL) injection pen Inject 14 Units subcutaneously at bedtime 15 mL 4    BASAGLAR KWIKPEN U-100 INSULIN 100 unit/mL (3 mL) injection pen Inject 0.14 mL (14 Units total) under the skin at bedtime. 15 mL 4    bisacodyL (DULCOLAX) 5 mg EC tablet Take 1 tablet (5 mg total) by mouth daily as needed.      blood-glucose meter (GLUCOSE MONITORING KIT) kit Use as instructed 1 each 0    cholecalciferol, vitamin D3, 125 mcg (5,000 unit) tablet Take 1 tablet (125 mcg total) by mouth daily.      cycloSPORINE modified (NEORAL) 100 MG capsule Take 2 capsules (200 mg total) by mouth daily AND 1 capsule (100 mg total) nightly. 270 capsule 3    cycloSPORINE modified (NEORAL) 25 MG capsule Take 3 capsules (75 mg total) by mouth nightly (with a 100mg  capsule). For a total dose of 200 mg in the morning , 175mg  every evening 270 capsule 3    DULoxetine (CYMBALTA) 30 MG capsule Take 2 capsules (60 mg total) by mouth nightly. 60 capsule  5    insulin glargine (BASAGLAR, LANTUS) 100 unit/mL (3 mL) injection pen Inject 0.14 mL (14 Units total) under the skin two (2) times a day. 15 mL 3    magnesium oxide-Mg AA chelate (MAGNESIUM, AMINO ACID CHELATE,) 133 mg Take 2 tablets by mouth Two (2) times a day. 120 tablet PRN    meclizine (ANTIVERT) 12.5 mg tablet Take 1 tablet (12.5 mg total) by mouth Three (3) times a day as needed for dizziness.      melatonin 3 mg Tab Take 1 tablet (3 mg total) by mouth nightly. 30 tablet 0    metoprolol succinate (TOPROL-XL) 25 MG 24 hr tablet Take 1 tablet (25 mg total) by mouth.      mirtazapine (REMERON) 15 MG tablet Take 1 tablet (15 mg total) by mouth nightly. 90 tablet 3    mycophenolate (CELLCEPT) 250 mg capsule Take 2 capsules (500 mg total) by mouth two (2) times a day. 120 capsule 11    pen needle, diabetic (PEN NEEDLE) 32 gauge x 5/32 (4 mm) Ndle Use with insulin twice a day 200 each 1    pen needle, diabetic (ULTICARE PEN NEEDLE) 32 gauge x 5/32 (4 mm) Ndle USE WITH INSULIN PEN 4 TIMES DAILY AS DIRECTED 100 each 1    pregabalin (LYRICA) 100 MG capsule Take 1 capsule (100 mg total) by mouth Two (2) times a day. 60 capsule 5    pregabalin (LYRICA) 100 MG capsule Take 1 capsule (100 mg total) by mouth Two (2) times a day. 60 capsule 5    pregabalin (LYRICA) 75 MG capsule Take 1 capsule (75 mg total) by mouth Two (2) times a day. 60 capsule 6    rosuvastatin (CRESTOR) 5 MG tablet Take 1 tablet (5 mg total) by mouth daily. 90 tablet 3    sildenafiL, pulm.hypertension, (REVATIO) 20 mg tablet Take 2 tablets (40 mg total) by mouth Three (3) times a day. 180 tablet 6    tamsulosin (FLOMAX) 0.4 mg capsule Take 1 capsule (0.4 mg total) by mouth daily. 90 capsule 0     No current facility-administered medications for this visit.        SOCIAL HISTORY:  Social History     Tobacco Use    Smoking status: Never    Smokeless tobacco: Never    Tobacco comments:     Smoked in high school for about 5 years.    Substance Use Topics    Alcohol use: No    Drug use: No        FAMILY HISTORY:  Unknown for prostate cancer disease.  Family History   Problem Relation Age of Onset    Hypertension Mother     Cancer Mother     Heart disease Mother     Heart disease Father     Cirrhosis Neg Hx     Liver cancer Neg Hx     Anemia Neg Hx     Diabetes Neg Hx     Kidney disease Neg Hx     Obesity Neg Hx     Thyroid disease Neg Hx     Osteoporosis Neg Hx     Coronary artery disease Neg Hx     Anesthesia problems Neg Hx     Basal cell carcinoma Neg Hx     Squamous cell carcinoma Neg Hx        REVIEW OF SYSTEMS:  A 10-system review of systems was completed  by the patient and  reviewed by me today.  All pertinent positives  were discussed in history of present illness.    PHYSICAL EXAMINATION:  VITAL SIGNS:  The patient is afebrile.  Vital signs are stable.  GENERAL:  In no apparent distress.  HEENT:  Eyes, ears and mouth appeared normal.  LYMPHATICS:  No cervical lymphadenopathy.  LUNGS:  Chest wall excursion symmetric bilaterally. No audible wheezing  ABDOMEN:  Soft, nontender, nondistended.  BACK:  Negative CVA tenderness.     DRE: (From prior visit) prostate mildly enlarged, smooth, soft no nodules  EXTREMITIES:  No lower extremity edema.  SKIN:  No rashes or jaundice.  NEUROLOGIC:  No focal deficits on neurologic exam.    LABORATORY TESTING:      Chemistry        Component Value Date/Time    NA 137 12/07/2021 1540    NA 141 09/26/2016 1640    K 5.1 (H) 12/07/2021 1540    K 4.1 09/26/2016 1640    CL 108 (H) 12/07/2021 1540    CL 106 09/25/2018 1715    CO2 23.0 12/07/2021 1540    CO2 29 10/10/2013 1417    BUN 31 (H) 12/07/2021 1540    BUN 34 (H) 09/25/2018 1715    CREATININE 2.21 (H) 12/07/2021 1540    CREATININE 1.0 09/25/2015 1042    GLU 268 (H) 12/07/2021 1540        Component Value Date/Time    CALCIUM 9.3 12/07/2021 1540    CALCIUM 9.3 10/10/2013 1417    ALKPHOS 75 05/17/2021 1307    ALKPHOS 214 (H) 01/09/2014 1029    AST 12 05/17/2021 1307    AST 46 01/09/2014 1029    ALT 11 05/17/2021 1307    ALT 41 01/09/2014 1029    BILITOT 0.6 05/17/2021 1307    BILITOT 0.5 12/12/2017 0830                  06/22/2021 AUA symptom score total of 35 with bother of 6 and a leakage of 4.  06/22/2021 postvoid residual 26 ml    02/15/2022 AUA SS 6 with bother of 1   02/15/2022 PVR 21 ml    Scribe's Attestation: Macy Mis, MD obtained and performed the history, physical exam and medical decision making elements that were entered into the chart.  Signed by Vergie Living, Scribe, on February 15, 2022 at 3:14 PM.    ----------------------------------------------------------------------------------------------------------------------  February 18, 2022 10:03 AM. Documentation assistance provided by the Scribe. I was present during the time the encounter was recorded. The information recorded by the Scribe was done at my direction and has been reviewed and validated by me.    Macy Mis MD  ----------------------------------------------------------------------------------------------------------------------

## 2022-02-17 ENCOUNTER — Ambulatory Visit: Admit: 2022-02-17 | Discharge: 2022-02-18 | Payer: MEDICARE

## 2022-02-17 DIAGNOSIS — Z794 Long term (current) use of insulin: Principal | ICD-10-CM

## 2022-02-17 DIAGNOSIS — M545 Chronic bilateral low back pain, unspecified whether sciatica present: Principal | ICD-10-CM

## 2022-02-17 DIAGNOSIS — G8929 Other chronic pain: Principal | ICD-10-CM

## 2022-02-17 DIAGNOSIS — G629 Polyneuropathy, unspecified: Principal | ICD-10-CM

## 2022-02-17 DIAGNOSIS — E1165 Type 2 diabetes mellitus with hyperglycemia: Principal | ICD-10-CM

## 2022-02-17 LAB — HEMOGLOBIN A1C
ESTIMATED AVERAGE GLUCOSE: 131 mg/dL
HEMOGLOBIN A1C: 6.2 % — ABNORMAL HIGH (ref 4.8–5.6)

## 2022-02-17 LAB — VITAMIN B12: VITAMIN B-12: 272 pg/mL (ref 211–911)

## 2022-02-17 MED ORDER — NORTRIPTYLINE 10 MG CAPSULE
ORAL_CAPSULE | Freq: Every evening | ORAL | 11 refills | 30 days | Status: CP
Start: 2022-02-17 — End: 2023-02-17
  Filled 2022-03-01: qty 30, 30d supply, fill #0

## 2022-02-17 NOTE — Unmapped (Signed)
Neurology Follow-up Visit Note     [facility]  [address]  [address]  [place] [address]    Date: 02/17/2022   Patient Name: Jeffrey Ward   MRN: [id number]   PCP: Jeffrey Ward  Referring Provider: [name], MD       Assessment and Plan          Jeffrey Ward is a 71 y.o. male presenting for follow-up of neuropathy.     Multifactorial peripheral neuropathy:   Multiple risk factors for neuropathy including previous critical illness, diabetes, hepatitis C s/p liver transplant, amiodarone therapy. He has tried Lyrica (ineffective, caused dizziness) and gabapentin (ineffective). He notes continued worsening of his neuropathy which is worst at night. This may be due to natural progression and stopping Lyrica, but repeat work-up and repeat EMG are appropriate. He has also never trialed a TCA.    Plan:  - Obtain Hgb A1c, Vit B12, ANA, ENA  - Repeat EMG  - Obtain PVL Venous and Arterial Duplex of BLE  - Refer to PT for evaluation of strength and balance  - Continue Cymbalta 60 mg nightly  - Start Nortriptyline 10 mg nightly with potential to increase further based on tolerance  - Refer to Jeffrey Ward for painful neuropathy clinic    Return Visit in:    I personally spent 60 minutes face-to-face and non-face-to-face in the care of this patient, which includes all pre, intra, and post visit time on the date of service.  All documented time was specific to the E/M visit and does not include any procedures that may have been performed.      Patient was seen and discussed with JeffreyMehrabyan who agrees with assessment and plan.    Della Goo, MD, PhD  Adult Neurology, PGY-2  [facility]  Pager [phone]           HPI         HPI: Jeffrey Ward is a 71 y.o. male who presents for follow up to the [facility] for follow-up of neuropathy. Patient was last seen by [facility] on 05/22/2020.      To review their relevant history, patient endorses history of neuropathy. He notes electric shocks in the toes, soles, and ankles. He occasionally notes pain in hands. Symptoms are much worse at night. He saw Jeffrey Ward in 2019. He saw chronic pain center in [place] where he got TENS unit and phototherapy which was helpful. Eventually TENS and phototherapy became less helpful and other medications such as Cymbalta and topical solutions were added on.    Interval History 02/17/2022:  He feels that his neuropathy is getting worse. He self-discontinued Lyrica due to ineffectiveness. He notes waking up at night 2-3 times. He has a sensation of needles and numbness. He notices it in his legs and feet and sometimes in his hands. The pain is a 6/10 during the day and a 10/10 at night. He notes some benefit from the Cymbalta. He had some short term benefit from the compounded topical solution.     Current treatments:   - Cymbalta 60 mg nightly (slightly effective)    Previous treatments:   - Lyrica 100 mg BID (caused dizziness; lower dose ineffective)  - Gabapentin (ineffective)  - tried TENS unit and phototherapy at pain management in [place] (effective but too expensive)  - Lidocaine gel (ineffective)  - Oxycontin helpful in  the past (stopped due to dependence)     Pertinent imaging/workup:  - MRI lumbar spine 02/12/2018: Extensive postoperative changes as above with old stable compression fracture at T12. Stable grade 2 anterolisthesis of L5 on S1 with associated mild left foraminal and central canal/subarticular zone stenosis. Poor visualization at the level of L2-3 as above secondary to metal artifact.  - EMG/NCS 12/26/2016:  Abnormal study. These electrodiagnostic findings show evidence of a mild axonal sensorimotor polyneuropathy, a severe left median neuropathy at the wrist, and a probable superimposed left lumbosacral radiculopathy.      Pertinent surgical/medical history:  T12 compression fracture s/p repair (1990)  Right hip fracture ~1 year ago  Hepatitis C s/p liver transplant 2018  DM (diagnosed a few years ago)  HTN  LS radiculopathy  CKD  Afib     Social history:  Denies alcohol  Denies tobacco  Denies illicit/rec drugs  Retired, Art gallery manager    No Known Allergies     Current Outpatient Medications   Medication Sig Dispense Refill    cycloSPORINE modified (NEORAL) 100 MG capsule Take 2 capsules (200 mg total) by mouth daily AND 1 capsule (100 mg total) nightly. 270 capsule 3    cycloSPORINE modified (NEORAL) 25 MG capsule Take 3 capsules (75 mg total) by mouth nightly (with a 100mg  capsule). For a total dose of 200 mg in the morning , 175mg  every evening 270 capsule 3    DULoxetine (CYMBALTA) 30 MG capsule Take 2 capsules (60 mg total) by mouth nightly. 60 capsule 5    melatonin 3 mg Tab Take 1 tablet (3 mg total) by mouth nightly. 30 tablet 0    metoprolol succinate (TOPROL-XL) 25 MG 24 hr tablet Take 1 tablet (25 mg total) by mouth.      mycophenolate (CELLCEPT) 250 mg capsule Take 2 capsules (500 mg total) by mouth two (2) times a day. 120 capsule 11    rosuvastatin (CRESTOR) 5 MG tablet Take 1 tablet (5 mg total) by mouth daily. 90 tablet 3    BASAGLAR KWIKPEN U-100 INSULIN 100 unit/mL (3 mL) injection pen Inject 14 Units subcutaneously at bedtime 15 mL 4    BASAGLAR KWIKPEN U-100 INSULIN 100 unit/mL (3 mL) injection pen Inject 0.14 mL (14 Units total) under the skin at bedtime. 15 mL 4    blood-glucose meter (GLUCOSE MONITORING KIT) kit Use as instructed 1 each 0    cholecalciferol, vitamin D3, 125 mcg (5,000 unit) tablet Take 1 tablet (125 mcg total) by mouth daily.      insulin glargine (BASAGLAR, LANTUS) 100 unit/mL (3 mL) injection pen Inject 0.14 mL (14 Units total) under the skin two (2) times a day. 15 mL 3    magnesium oxide-Mg AA chelate (MAGNESIUM, AMINO ACID CHELATE,) 133 mg Take 2 tablets by mouth Two (2) times a day. 120 tablet PRN    mirtazapine (REMERON) 15 MG tablet Take 1 tablet (15 mg total) by mouth nightly. 90 tablet 3    pen needle, diabetic (PEN NEEDLE) 32 gauge x 5/32 (4 mm) Ndle Use with insulin twice a day 200 each 1    pen needle, diabetic (ULTICARE PEN NEEDLE) 32 gauge x 5/32 (4 mm) Ndle USE WITH INSULIN PEN 4 TIMES DAILY AS DIRECTED 100 each 1    sildenafiL, pulm.hypertension, (REVATIO) 20 mg tablet Take 2 tablets (40 mg total) by mouth Three (3) times a day. 180 tablet 6    tamsulosin (FLOMAX) 0.4 mg capsule Take 1 capsule (0.4 mg  total) by mouth daily. 90 capsule 0     No current facility-administered medications for this visit.       Past Medical History:   Diagnosis Date    Atrial fibrillation (CMS-HCC)     Basal cell carcinoma     Cancer (CMS-HCC)     Cirrhosis (CMS-HCC)     Depression     Diabetes (CMS-HCC)     Headache     Hepatitis C 07/17/2012    History of transfusion     Liver disease     Low back pain 07/17/2012    Varices, esophageal (CMS-HCC)        Past Surgical History:   Procedure Laterality Date    ANKLE SURGERY      BACK SURGERY      BACK SURGERY      CARDIAC SURGERY      pacemaker    CHG US GUIDE, TISSUE ABLATION N/A 01/22/2016    Procedure: ULTRASOUND GUIDANCE FOR, AND MONITORING OF, PARENCHYMAL TISSUE ABLATION;  Surgeon: Particia Nearing, MD;  Location: MAIN OR [place];  Service: Transplant    IR EMBOLIZATION ORGAN ISCHEMIA, TUMORS, INFAR  06/16/2016    IR EMBOLIZATION ORGAN ISCHEMIA, TUMORS, INFAR 06/16/2016 Ammie Dalton, MD IMG VIR H&V [facility]    PR COLON CA SCRN NOT HI RSK IND N/A 02/27/2015    Procedure: COLOREC CNCR SCR;COLNSCPY NO;  Surgeon: Vonda Antigua, MD;  Location: GI PROCEDURES MEMORIAL [facility];  Service: Gastroenterology    PR ENDOSCOPIC ULTRASOUND EXAM N/A 02/27/2015    Procedure: UGI ENDO; W/ENDO ULTRASOUND EXAM INCLUDES ESOPHAGUS, STOMACH, &/OR DUODENUM/JEJUNUM;  Surgeon: Vonda Antigua, MD;  Location: GI PROCEDURES MEMORIAL [facility];  Service: Gastroenterology    PR ERCP BALLOON DILATE BILIARY/PANC DUCT/AMPULLA EA N/A 02/10/2017    Procedure: ERCP;WITH TRANS-ENDOSCOPIC BALLOON DILATION OF BILIARY/PANCREATIC DUCT(S) OR OF AMPULLA, INCLUDING SPHINCTERECTOMY, WHEN PERFOREMD,EACH DUCT ([address]);  Surgeon: Mayford Knife, MD;  Location: GI PROCEDURES MEMORIAL [facility];  Service: Gastroenterology    PR ERCP REMOVE FOREIGN BODY/STENT BILIARY/PANC DUCT N/A 02/10/2017    Procedure: ENDOSCOPIC RETROGRADE CHOLANGIOPANCREATOGRAPHY (ERCP); W/ REMOVAL OF FOREIGN BODY/STENT FROM BILIARY/PANCREATIC DUCT(S);  Surgeon: Mayford Knife, MD;  Location: GI PROCEDURES MEMORIAL [facility];  Service: Gastroenterology    PR ERCP REMOVE FOREIGN BODY/STENT BILIARY/PANC DUCT N/A 06/29/2017    Procedure: ENDOSCOPIC RETROGRADE CHOLANGIOPANCREATOGRAPHY (ERCP); W/ REMOVAL OF FOREIGN BODY/STENT FROM BILIARY/PANCREATIC DUCT(S);  Surgeon: Vonda Antigua, MD;  Location: GI PROCEDURES MEMORIAL [facility];  Service: Gastroenterology    PR ERCP STENT PLACEMENT BILIARY/PANCREATIC DUCT N/A 11/29/2016    Procedure: ENDOSCOPIC RETROGRADE CHOLANGIOPANCREATOGRAPHY (ERCP); WITH PLACEMENT OF ENDOSCOPIC STENT INTO BILIARY OR PANCREATIC DUCT;  Surgeon: Chriss Driver, MD;  Location: GI PROCEDURES MEMORIAL [facility];  Service: Gastroenterology    PR ERCP STENT PLACEMENT BILIARY/PANCREATIC DUCT N/A 04/26/2017    Procedure: ENDOSCOPIC RETROGRADE CHOLANGIOPANCREATOGRAPHY (ERCP); WITH PLACEMENT OF ENDOSCOPIC STENT INTO BILIARY OR PANCREATIC DUCT;  Surgeon: Mayford Knife, MD;  Location: GI PROCEDURES MEMORIAL [facility];  Service: Gastroenterology    PR ERCP,W/REMOVAL STONE,BIL/PANCR DUCTS N/A 11/29/2016    Procedure: ERCP; W/ENDOSCOPIC RETROGRADE REMOVAL OF CALCULUS/CALCULI FROM BILIARY &/OR PANCREATIC DUCTS;  Surgeon: Chriss Driver, MD;  Location: GI PROCEDURES MEMORIAL [facility];  Service: Gastroenterology    PR ERCP,W/REMOVAL STONE,BIL/PANCR DUCTS N/A 02/10/2017    Procedure: ERCP; W/ENDOSCOPIC RETROGRADE REMOVAL OF CALCULUS/CALCULI FROM BILIARY &/OR PANCREATIC DUCTS;  Surgeon: Mayford Knife, MD;  Location: GI PROCEDURES MEMORIAL [facility];  Service: Gastroenterology    PR ERCP,W/REMOVAL STONE,BIL/PANCR DUCTS N/A 04/26/2017  Procedure: ERCP; W/ENDOSCOPIC RETROGRADE REMOVAL OF CALCULUS/CALCULI FROM BILIARY &/OR PANCREATIC DUCTS;  Surgeon: Mayford Knife, MD;  Location: GI PROCEDURES MEMORIAL [facility];  Service: Gastroenterology    PR ERCP,W/REMOVAL STONE,BIL/PANCR DUCTS N/A 06/29/2017    Procedure: ERCP; W/ENDOSCOPIC RETROGRADE REMOVAL OF CALCULUS/CALCULI FROM BILIARY &/OR PANCREATIC DUCTS;  Surgeon: Vonda Antigua, MD;  Location: GI PROCEDURES MEMORIAL [facility];  Service: Gastroenterology    PR ESOPHAGOSCP RIG TRANSORAL HYPOPHARYNX CRV ESOPH N/A 04/04/2018    Procedure: ESOPHAGOSCOPY, RIGID, TRANSORAL WITH DIVERTICULECTOMY OF HYPOPHARYNX OR CERVICAL ESOPHAGUS, WITH CRICOPHARYNGEAL MYOTOMY, INCLUDES USE OF TELESCOPE OR OPERATING MICROSCOPE AND REPAIR, WHEN PERFORMED;  Surgeon: Vista Deck, MD;  Location: MAIN OR [facility];  Service: ENT    PR INSER HEART TEMP PACER ONE CHMBR N/A 10/02/2016    Procedure: Tempoarary Pacemaker Insertion;  Surgeon: Meredith Leeds, MD;  Location: [facility];  Service: Cardiology    PR LAP,DIAGNOSTIC ABDOMEN N/A 01/22/2016    Procedure: Laparoscopy, Abdomen, Peritoneum, & Omentum, Diagnostic, W/Wo Collection Specimen(S) By Brushing Or Washing;  Surgeon: Particia Nearing, MD;  Location: MAIN OR [facility];  Service: Transplant    PR LARYNGOPLASTY MEDIALIZATION Neysa Bonito N/A 11/07/2018    Procedure: R31 LARYNGOPLASTY, MEDIALIZATION, UNILATERAL;  Surgeon: Vista Deck, MD;  Location: MAIN OR [facility];  Service: ENT    PR PLACE PERCUT GASTROSTOMY TUBE N/A 11/17/2016    Procedure: UGI ENDO; W/DIRECTED PLCMT PERQ GASTROSTOMY TUBE;  Surgeon: Cletis Athens, MD;  Location: GI PROCEDURES MEMORIAL [facility];  Service: Gastroenterology    PR TRACHEOSTOMY, PLANNED N/A 09/29/2016    Procedure: TRACHEOSTOMY PLANNED (SEPART PROC);  Surgeon: Katherina Mires, MD;  Location: MAIN OR [facility];  Service: Trauma    PR TRANSCATH INSERT OR REPLACE LEADLESS PM VENTR N/A 10/11/2016    Procedure: Pacemaker Implant/Replace Leadless;  Surgeon: Meredith Leeds, MD;  Location: [facility];  Service: Cardiology    PR TRANSPLANT LIVER,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    Procedure: Liver Allotransplantation; Orthotopic, Partial Or Whole, From Cadaver Or Living Donor, Any Age;  Surgeon: Doyce Loose, MD;  Location: MAIN OR [facility];  Service: Transplant    PR TRANSPLANT,PREP DONOR LIVER, WHOLE N/A 09/15/2016    Procedure: Rogelia Boga Std Prep Cad Donor Whole Liver Gft Prior Tnsplnt,Inc Chole,Diss/Rem Surr Tissu Wo Triseg/Lobe Splt;  Surgeon: Doyce Loose, MD;  Location: MAIN OR [facility];  Service: Transplant    PR UPPER GI ENDOSCOPY,BIOPSY N/A 07/17/2012    Procedure: UGI ENDOSCOPY; WITH BIOPSY, SINGLE OR MULTIPLE;  Surgeon: Alba Destine, MD;  Location: GI PROCEDURES MEMORIAL [facility];  Service: Gastroenterology    PR UPPER GI ENDOSCOPY,DIAGNOSIS N/A 02/04/2014    Procedure: UGI ENDO, INCLUDE ESOPHAGUS, STOMACH, & DUODENUM &/OR JEJUNUM; DX W/WO COLLECTION SPECIMN, BY BRUSH OR WASH;  Surgeon: Wilburt Finlay, MD;  Location: GI PROCEDURES MEMORIAL [facility];  Service: Gastroenterology    PR UPPER GI ENDOSCOPY,LIGAT VARIX N/A 11/05/2013    Procedure: UGI ENDO; Everlene Balls LIG ESOPH &/OR GASTRIC VARICES;  Surgeon: Wilburt Finlay, MD;  Location: GI PROCEDURES MEMORIAL [facility]  Service: Gastroenterology       Social History     Socioeconomic History    Marital status: Widowed     Spouse name: None    Number of children: None    Years of education: None    Highest education level: None   Tobacco Use    Smoking status: Never    Smokeless tobacco: Never    Tobacco comments:     Smoked in high school for about  5 years.    Substance and Sexual Activity    Alcohol use: No    Drug use: No    Sexual activity: Yes     Partners: Female     Birth control/protection: None   Other Topics Concern    Exercise No    Living Situation No       Family History   Problem Relation Age of Onset Hypertension Mother     Cancer Mother     Heart disease Mother     Heart disease Father     Cirrhosis Neg Hx     Liver cancer Neg Hx     Anemia Neg Hx     Diabetes Neg Hx     Kidney disease Neg Hx     Obesity Neg Hx     Thyroid disease Neg Hx     Osteoporosis Neg Hx     Coronary artery disease Neg Hx     Anesthesia problems Neg Hx     Basal cell carcinoma Neg Hx     Squamous cell carcinoma Neg Hx             Objective        Vital signs: BP 132/72  - Pulse 86  - Ht 175.3 cm (5' 9)  - Wt 65.5 kg (144 lb 4.8 oz)  - BMI 21.31 kg/m??      Physical Exam:  General Appearance:Chronically ill appearing. In no acute distress. Overweight.  HEENT: Head is atraumatic and normocephalic. Sclera anicteric without injection. Oropharyngeal membranes are moist with no erythema or exudate.  Neck: Supple.  Lungs: No increased work of breathing  Heart: Regular rate and rhythm.  Abdomen: Soft, nontender, nondistended.  Extremities: No evidence of cyanosis or edema     Neurological Examination:      Mental Status: Alert, conversant, able to follow conversation and interview. Spontaneous speech was fluent without word finding pauses, dysarthria, or paraphasic errors. Comprehension was intact to simple and multi-step commands. Memory for recent and remote events was intact.     Cranial Nerves: PERRL. Pursuit eye movements were uninterrupted with full range and without more than end-gaze nystagmus. Facial sensation intact bilaterally to light touch on the forehead, cheek, and chin. Face symmetric at rest. Normal facial movement bilaterally, including forehead, eye closure and grimace/smile. Hearing intact to conversation. Shoulder shrug full strength bilaterally. Palate movement is symmetric. Tongue protrudes midline and tongue movements are normal.     Motor Exam: Normal bulk.  No tremors, myoclonus, or other adventitious movement.  5/5 BUE  4/5 R hip flexors (pain limited, prior hip surgery), 4+/5 L hip flexors, 5/5 bilateral knee flexors/extensors, 5/5 dorsi/plantarflexion        Reflexes:    R L   Biceps +2 +2   Brachioradialis +2 +2   Triceps +2 +2   Patella +1 +1   Achilles +2 +2   Toes are downgoing bilaterally.     Sensory: Diminished cold temperature sensation from the mid-shin. Absent vibratory sensation at the toes. Diminished vibratory sensation at the ankles.     Cerebellar/Coordination/Gait: Finger-to-nose is normal without ataxia or dysmetria bilaterally. Gait exam demonstrates antalgic gait favoring right leg. Romberg positive.     Diagnostic Studies

## 2022-02-18 NOTE — Unmapped (Addendum)
It was nice meeting you today. We discussed your neuropathy today.    We will obtain some labwork today  Start nortriptyline 10 mg nightly  I have ordered a repeat EMG as well as ultrasound of the vessels in your legs  I sent a referral to Dr. Elesa Hacker painful neuropathy clinic  I also sent you a PT referral to work on your strength and balance    Follow-up with me in 6 months

## 2022-02-22 LAB — ANA: ANTINUCLEAR ANTIBODIES (ANA): POSITIVE — AB

## 2022-02-23 ENCOUNTER — Ambulatory Visit: Admission: RE | Admit: 2022-02-23 | Payer: Medicare Other | Source: Ambulatory Visit

## 2022-02-23 NOTE — Unmapped (Signed)
[facility] Refill Coordination Note    Specialty Medication(s) to be Shipped:   Transplant: mycophenolate mofetil 250mg , cyclosporine 100mg , and cyclosporine 25mg     Other medication(s) to be shipped:  duloxetine , mag-ox , nortriptyline and sildenafil      Jeffrey Ward, DOB: 01/19/1951  Phone: [phone] (home)       All above HIPAA information was verified with patient.     Was a Nurse, learning disability used for this call? No    Completed refill call assessment today to schedule patient's medication shipment from the [facility] [phone]).  All relevant notes have been reviewed.     Specialty medication(s) and dose(s) confirmed: Regimen is correct and unchanged.   Changes to medications: Jeffrey Ward reports no changes at this time.  Changes to insurance: No  New side effects reported not previously addressed with a pharmacist or physician: None reported  Questions for the pharmacist: No    Confirmed patient received a Conservation officer, historic buildings and a Surveyor, mining with first shipment. The patient will receive a drug information handout for each medication shipped and additional FDA Medication Guides as required.       DISEASE/MEDICATION-SPECIFIC INFORMATION        N/A    SPECIALTY MEDICATION ADHERENCE     Medication Adherence    Patient reported X missed doses in the last month: 0  Specialty Medication: cycloSPORINE modified 100 MG capsule (NeoraL)  Patient is on additional specialty medications: Yes  Additional Specialty Medications: cycloSPORINE modified 25 MG capsule (NeoraL)  Patient Reported Additional Medication X Missed Doses in the Last Month: 0  Patient is on more than two specialty medications: Yes  Specialty Medication: mycophenolate 250 mg capsule (CELLCEPT)  Patient Reported Additional Medication X Missed Doses in the Last Month: 0            Support network for adherence: family member                    Were doses missed due to medication being on hold? No    mycophenolate 250 mg: 10 days of medicine on hand   cyclosporine 100 mg: 10 days of medicine on hand   Cyclosporine  25 mg: 10 days of medicine on hand        REFERRAL TO PHARMACIST     Referral to the pharmacist: Not needed      SHIPPING     Shipping address confirmed in Epic.     Delivery Scheduled: Yes, Expected medication delivery date: 03/02/22.     Medication will be delivered via Next Day Courier to the prescription address in Epic WAM.    Quintella Reichert   [facility] Specialty Technician

## 2022-02-24 LAB — EXTRACTABLE NUCLEAR ANTIGEN: ENA SCREEN: 0.2 ENA Units (ref <0.70)

## 2022-03-01 MED FILL — MYCOPHENOLATE MOFETIL 250 MG CAPSULE: ORAL | 30 days supply | Qty: 120 | Fill #1

## 2022-03-01 MED FILL — CYCLOSPORINE MODIFIED 100 MG CAPSULE: ORAL | 30 days supply | Qty: 90 | Fill #10

## 2022-03-01 MED FILL — DULOXETINE 30 MG CAPSULE,DELAYED RELEASE: ORAL | 30 days supply | Qty: 60 | Fill #3

## 2022-03-01 MED FILL — CYCLOSPORINE MODIFIED 25 MG CAPSULE: ORAL | 30 days supply | Qty: 90 | Fill #10

## 2022-03-01 MED FILL — MG-PLUS-PROTEIN 133 MG TABLET: ORAL | 30 days supply | Qty: 120 | Fill #11

## 2022-03-01 MED FILL — SILDENAFIL (PULMONARY HYPERTENSION) 20 MG TABLET: ORAL | 30 days supply | Qty: 180 | Fill #3

## 2022-03-11 ENCOUNTER — Ambulatory Visit: Admit: 2022-03-11 | Discharge: 2022-03-12 | Payer: MEDICARE

## 2022-03-11 DIAGNOSIS — G629 Polyneuropathy, unspecified: Principal | ICD-10-CM

## 2022-03-15 MED ORDER — INSULIN GLARGINE (U-100) 100 UNIT/ML (3 ML) SUBCUTANEOUS PEN
Freq: Every evening | SUBCUTANEOUS | 2 refills | 107 days
Start: 2022-03-15 — End: ?

## 2022-03-18 ENCOUNTER — Ambulatory Visit
Admit: 2022-03-18 | Payer: MEDICARE | Attending: Rehabilitative and Restorative Service Providers" | Primary: Rehabilitative and Restorative Service Providers"

## 2022-03-18 ENCOUNTER — Ambulatory Visit
Admit: 2022-03-18 | Discharge: 2022-04-16 | Payer: MEDICARE | Attending: Rehabilitative and Restorative Service Providers" | Primary: Rehabilitative and Restorative Service Providers"

## 2022-03-18 NOTE — Unmapped (Signed)
[facility] [place]  OUTPATIENT PHYSICAL THERAPY  03/18/2022          Patient Name: Jeffrey Ward  Date of Birth:01/15/1951  Diagnosis: No diagnosis found.  Referring MD:  Florian Buff, MD     Date of Onset of Impairment-No date available  Date PT Care Plan Established or Reviewed-No date available  Date PT Treatment Started-No date available   Plan of Care Effective Date:          Assessment/Plan:    Assessment  Assessment details:    72 y.o. male with complex pmhx, multiple trauma ~5 years ago from 75' fall, multi-level fusion t/s and L-S spines. Here for back pain, leg weakness, falling, lower activity. Initiated HEP today          Impairments: decreased range of motion, decreased strength, decreased mobility, impaired ADLs, pain and postural weakness      Personal Factors/Comorbidities: 1-2      Examination of Body Systems: activity/participation and musculoskeletal    Clinical Presentation: stable    Clinical Decision Making: complex    Prognosis: excellent prognosis    Positive Prognosis Rationale: behavior, motivated for treatment and response to trial tx.      Therapy Goals      Goals:      1) Patient will demonstrate self-efficacy through their independence in performing the home exercise program as demonstrated by verbal and/or physical performance confirmation at each session or subsequent session that HEP is given  2) Patient is able to walk 30-45 minutes without rest  3) Pt is able to do ADLs around house and yard care with pain <5/10  4) pt can go for 30 minute motorcycle ride without danger or limitation    Plan    Therapy options: will be seen for skilled physical therapy services    Planned therapy interventions: Body Mechanics Training, Manual Therapy, Neuromuscular Re-education, Therapeutic Activities, Therapeutic Exercises, Education - Patient, Home Exercise Program and Postural Training      Frequency: 1x week    Duration in weeks: 8-12    Education provided to: patient. Education provided: Anatomy, Back care, Body mechanics, Importance of Therapy, HEP and Posture    Education results: needs reinforcement.      Next visit plan:        Follow up on HEP    TUG, 30 sec sit to stand tests    Grip strength    Progress stretching    Slow introduction to strengthening    Total Session Time: 45      Subjective:   History of Present Condition      History of Present Condition/Chief Complaint:       M54.50,G89.29 (ICD-10-CM) - Chronic bilateral low back pain, unspecified whether sciatica present  =========================  MD Plan -   Plan:  - Obtain Hgb A1c, Vit B12, ANA, ENA  - Repeat EMG  - Obtain PVL Venous and Arterial Duplex of BLE  - Refer to PT for evaluation of strength and balance  - Continue Cymbalta 60 mg nightly  - Start Nortriptyline 10 mg nightly with potential to increase further based on tolerance  - Refer to Dr. Fabian November for painful neuropathy clinic    Subjective:     72 y.o. male with complex medical history including (but not limited to) liver transplant and back surgery. Sent to PT for strengthening and balance.   Approximately 5-6 years ago pt fell ~30-40' from a tree. Fracture back, leg, ankle. Had  3-back surgeries as a result. He has ongoing pinched nerve in R back and SIJ area, he also has neuropathy. Since the surgeries he has not had outpatient PT, when seeing neurologist he was referred to PT.     He did fall 3 - 4x last year.    Pain  Current pain rating: 6  At best pain rating: 5  At worst pain rating: 10  Location: R low back / gluteal area, both legs and feet (neuropathy)  Alleviating factors: tylenol.  Exacerbated by: worse in the morning; bend over when standing; getting up after sitting > 30 minutes; sitting > 30 minutes.  Red flags: none.    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 Upright.  FINDINGS:  Posterior spinal hardware spans T10- L2 with cerclage wires at L1 spanning T12 compression fracture. Similar height loss T12 compared to prior exam. Other vertebral body heights preserved.    Posterior spinal fusion hardware from L5-S1 is unchanged. There is perihardware lucency around the left S1 screw. Multilevel intervertebral disc space narrowing throughout the lumbar spine, most severe between L5-S1. Similar grade 2 anterolisthesis of L5 on S1.  Mild lumbar levoscoliosis. Mild sacroiliac sclerosis.  Extensive surgical clips throughout the abdomen.        Patient Goals  Patient/family treatment goals: ride motorcycle without pain or limitation; get back to dating; walk without pain.    Objective:   Objective  INITIAL  Observations:   General:  patient is well appearing male in NAD, appears stated age, agreeable to PT examination  Skin assessment/Edema: visible area intact  Posture:  Normal Sitting Posture: fair -- uncomfortable after 15 minutes (moving around, shifting)    Normal Standing Posture: knees flexed, hips flexed, low back flexed, fwd head and shoulders    Symptom changes with standing posture movements --   --Standing Upright: slifghtly better  -- Standing Slouched: worse  Gait Analysis:   slow pace, R side antalgic steps, no arm swing, short steps    Range of Motion:  Spine    RIGHT LEFT         Lumbar   Flexion   Limited 50%, pain    Extension   Limited 75%, pain    Rotation   Unable - p! Unable - p!    Sidebend   painful Painful more    Hamstrings   (-) 40 (-) 40       HIPS Ext. Rotation 35 35    Internal Rotation 10 8      Repeated Movements Testing:   NT  Neurological Exam:    ===========================  Treatment Provided Today:  Initiate HEP    Progressive List of Home Exercises:  VISIT # EXERCISES COMMENTS   1 Hip ER, LTR, SKTC, bridge All good in clinic, sore   2     3     4     5     6     7        Manual Therapy - 0 minutes:    Therapeutic Exercise / Home Exercise Program - 10 minutes:  Patient was educated about proper performance of HEP exercises and was observed performing them correctly.  Patient was given a written handout of exercises for the HEP.      Total Treatment Time: 45 minutes    Patient Education:   Throughout evaluation patient educated regarding the following: Role of PT in Rehabilitation, HEP, importance of therapy, posture, body awareness, treatment plan, and back care. Patient demonstrated and verbalized agreement and understanding.    - Tested repeated movements as needed to determine directional preference and guide self care strategies.    - Agreed on home exercise program with verbal, written instruction as well as demo, return demo today in clinic for proper understanding.    - Discussed condition at length and warned to avoid true worsening or peripheralization of symptoms.  - Patient Education:  Role of PT in Rehabilitation, HEP, posture, treatment plan and Indications/Contraindications to Exercises.  Patient demonstrated and verbalized agreement and understanding.  - Abbreviations used in this document include the following: P: Produces, A: Abolishes, I: Increases, D: Decreases, B: Better, W: Worse, NB: No Better, NW: No Worse, NE: No Effect, Rep: Repeated, Sust: Sustained, OP: Overpressure, Mobs: Mobilizations.     Communication/consultation with other professionals:   medicare Cert/POC sent to referring practitioner.  Equipment provided/recommended:   N/A.                    I attest that I have reviewed the above information.  SignedKelton Pillar Eloise Mula, PT  03/18/2022 8:44 AM

## 2022-03-31 DIAGNOSIS — Z944 Liver transplant status: Principal | ICD-10-CM

## 2022-03-31 MED ORDER — MG-PLUS-PROTEIN 133 MG TABLET
ORAL_TABLET | Freq: Two times a day (BID) | ORAL | PRN refills | 30 days | Status: CP
Start: 2022-03-31 — End: ?
  Filled 2022-05-05: qty 120, 30d supply, fill #0

## 2022-03-31 NOTE — Unmapped (Signed)
Pt request for RX Refill

## 2022-03-31 NOTE — Unmapped (Signed)
[facility] [place]  OUTPATIENT PHYSICAL THERAPY  03/31/2022          Patient Name: Jeffrey Ward  Date of Birth:[birth year]  Diagnosis:   Encounter Diagnosis   Name Primary?   ??? Chronic bilateral low back pain with sciatica, sciatica laterality unspecified Yes     Referring MD:  Florian Buff, MD     Date of Onset of Impairment-No date available  Date PT Care Plan Established or Reviewed-03/18/2022  Date PT Treatment Started-03/18/2022   Plan of Care Effective Date: 03/18/2022 - 06/16/2022         Assessment/Plan:    Assessment  Assessment details:    TODAY - no changes, too soon to see improvements from HEP, continue to build program with stretching and strengthening    INITIAL -   72 y.o. male with complex pmhx, multiple trauma ~5 years ago from 55' fall, multi-level fusion t/s and L-S spines. Here for back pain, leg weakness, falling, lower activity. Initiated HEP today          Impairments: decreased range of motion, decreased strength, decreased mobility, impaired ADLs, pain and postural weakness      Personal Factors/Comorbidities: 1-2      Examination of Body Systems: activity/participation and musculoskeletal    Clinical Presentation: stable    Clinical Decision Making: complex    Prognosis: excellent prognosis    Positive Prognosis Rationale: behavior, motivated for treatment and response to trial tx.      Therapy Goals      Goals:      1) Patient will demonstrate self-efficacy through their independence in performing the home exercise program as demonstrated by verbal and/or physical performance confirmation at each session or subsequent session that HEP is given  2) Patient is able to walk 30-45 minutes without rest  3) Pt is able to do ADLs around house and yard care with pain <5/10  4) pt can go for 30 minute motorcycle ride without danger or limitation    Plan    Therapy options: will be seen for skilled physical therapy services    Planned therapy interventions: Body Mechanics Training, Manual Therapy, Neuromuscular Re-education, Therapeutic Activities, Therapeutic Exercises, Education - Patient, Home Exercise Program and Postural Training      Frequency: 1x week    Duration in weeks: 8-12    Education provided to: patient.    Education provided: Anatomy, Back care, Body mechanics, Importance of Therapy, HEP and Posture    Education results: needs reinforcement.      Next visit plan:        Follow up on HEP    Progress stretching    Slow introduction to strengthening    Total Session Time: 45      Subjective:   History of Present Condition      History of Present Condition/Chief Complaint:       M54.50,G89.29 (ICD-10-CM) - Chronic bilateral low back pain, unspecified whether sciatica present  =========================  MD Plan -   Plan:  - Obtain Hgb A1c, Vit B12, ANA, ENA  - Repeat EMG  - Obtain PVL Venous and Arterial Duplex of BLE  - Refer to PT for evaluation of strength and balance  - Continue Cymbalta 60 mg nightly  - Start Nortriptyline 10 mg nightly with potential to increase further based on tolerance  - Refer to Dr. Fabian November for painful neuropathy clinic    Subjective:     TODAY (visit 2) - sore since the  first PT session. Doing HEP 2x per day -- sometimes the exercises help, sometimes not helpful.    Pain - 5-6/10 today -- R hip/glute and R low back    INITIAL  72 y.o. male with complex medical history including (but not limited to) liver transplant and back surgery. Sent to PT for strengthening and balance.   Approximately 5-6 years ago pt fell ~30-40' from a tree. Fracture back, leg, ankle. Had 3-back surgeries as a result. He has ongoing pinched nerve in R back and SIJ area, he also has neuropathy. Since the surgeries he has not had outpatient PT, when seeing neurologist he was referred to PT.     He did fall 3 - 4x last year.    Pain  Current pain rating: 6  At best pain rating: 5  At worst pain rating: 10  Location: R low back / gluteal area, both legs and feet (neuropathy)  Alleviating factors: tylenol.  Exacerbated by: worse in the morning; bend over when standing; getting up after sitting > 30 minutes; sitting > 30 minutes.  Red flags: none.    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 Upright.  FINDINGS:  Posterior spinal hardware spans T10- L2 with cerclage wires at L1 spanning T12 compression fracture. Similar height loss T12 compared to prior exam. Other vertebral body heights preserved.    Posterior spinal fusion hardware from L5-S1 is unchanged. There is perihardware lucency around the left S1 screw. Multilevel intervertebral disc space narrowing throughout the lumbar spine, most severe between L5-S1. Similar grade 2 anterolisthesis of L5 on S1.  Mild lumbar levoscoliosis. Mild sacroiliac sclerosis.  Extensive surgical clips throughout the abdomen.        Patient Goals  Patient/family treatment goals: ride motorcycle without pain or limitation; get back to dating; walk without pain.    Objective:   Objective  INITIAL  Observations:   General:  patient is well appearing male in NAD, appears stated age, agreeable to PT examination  Skin assessment/Edema: visible area intact  Posture:  Normal Sitting Posture: fair -- uncomfortable after 15 minutes (moving around, shifting)    Normal Standing Posture: knees flexed, hips flexed, low back flexed, fwd head and shoulders    Symptom changes with standing posture movements --   --Standing Upright: slifghtly better  -- Standing Slouched: worse  Gait Analysis:   slow pace, R side antalgic steps, no arm swing, short steps    Range of Motion:  Spine    RIGHT LEFT Lumbar   Flexion   Limited 50%, pain    Extension   Limited 75%, pain    Rotation   Unable - p! Unable - p!    Sidebend   painful Painful more    Hamstrings   (-) 40 (-) 40       HIPS Ext. Rotation 35 35    Internal Rotation 10 8      Repeated Movements Testing:   NT  Neurological Exam:    ===========================  Treatment Provided Today:  TUG - 14.41 second  30-second sit to stand - 8 rep (  with hands)     Review HEP   Hip ER - x15 (was doing it to pain and unilaterally) -- bilateral now  LTR - hurts on the R when he rotates to the R - x10 ea  SKTC - x10 ea  Bridge - 1x15    Hamstring stretch - manual hamstring stretch each leg  Manual hip IR/ER each leg    SLR - 2x15 each - hep    Hamstring stretch for home - 0:30 each x2 - hep    Progressive List of Home Exercises:  VISIT # EXERCISES COMMENTS   1 Hip ER, LTR, SKTC, bridge All good in clinic, sore   2 1/18 - hams.stretch supine; SLR    3     4     5     6     7        Manual Therapy - 0 minutes:    Therapeutic Exercise / Home Exercise Program - 40 minutes:  Patient was educated about proper performance of HEP exercises and was observed performing them correctly.  Patient was given a written handout of exercises for the HEP.      Total Treatment Time: 45 minutes    Patient Education:   Throughout evaluation patient educated regarding the following: Role of PT in Rehabilitation, HEP, importance of therapy, posture, body awareness, treatment plan, and back care. Patient demonstrated and verbalized agreement and understanding.    Communication/consultation with other professionals:   medicare Cert/POC sent to referring practitioner.  Equipment provided/recommended:   N/A.                    I attest that I have reviewed the above information.  SignedKelton Pillar Laquia Rosano, PT  03/31/2022 1:45 PM

## 2022-04-04 MED ORDER — PEN NEEDLE, DIABETIC 32 GAUGE X 5/32" (4 MM)
1 refills | 0 days
Start: 2022-04-04 — End: 2023-04-04

## 2022-04-04 MED ORDER — MIRTAZAPINE 15 MG TABLET
ORAL_TABLET | Freq: Every evening | ORAL | 3 refills | 90 days
Start: 2022-04-04 — End: 2023-04-04

## 2022-04-04 MED ORDER — BASAGLAR KWIKPEN U-100 INSULIN 100 UNIT/ML (3 ML) SUBCUTANEOUS
SUBCUTANEOUS | 4 refills | 0 days
Start: 2022-04-04 — End: ?

## 2022-04-04 NOTE — Unmapped (Signed)
Please send to PCP

## 2022-04-04 NOTE — Unmapped (Signed)
Pt request for RX Refill

## 2022-04-04 NOTE — Unmapped (Signed)
PLease send to PCP

## 2022-04-14 NOTE — Unmapped (Signed)
[facility] [place]  OUTPATIENT PHYSICAL THERAPY  04/14/2022          Patient Name: Jeffrey Ward  Date of Birth:Nov 17, 1950  Diagnosis:   Encounter Diagnosis   Name Primary?   ??? Chronic bilateral low back pain with sciatica, sciatica laterality unspecified Yes     Referring MD:  Florian Buff, MD     Date of Onset of Impairment-No date available  Date PT Care Plan Established or Reviewed-03/18/2022  Date PT Treatment Started-03/18/2022   Plan of Care Effective Date: 03/18/2022 - 06/16/2022         Assessment/Plan:    Assessment  Assessment details:    TODAY - might be getting easier to get up from floor, added to HEP for strengthening -- now can alternate programs, each one, every other day (so exercising every day).    INITIAL -   72 y.o. male with complex pmhx, multiple trauma ~5 years ago from 3' fall, multi-level fusion t/s and L-S spines. Here for back pain, leg weakness, falling, lower activity. Initiated HEP today          Impairments: decreased range of motion, decreased strength, decreased mobility, impaired ADLs, pain and postural weakness      Personal Factors/Comorbidities: 1-2      Examination of Body Systems: activity/participation and musculoskeletal    Clinical Presentation: stable    Clinical Decision Making: complex    Prognosis: excellent prognosis    Positive Prognosis Rationale: behavior, motivated for treatment and response to trial tx.      Therapy Goals      Goals:      1) Patient will demonstrate self-efficacy through their independence in performing the home exercise program as demonstrated by verbal and/or physical performance confirmation at each session or subsequent session that HEP is given  2) Patient is able to walk 30-45 minutes without rest  3) Pt is able to do ADLs around house and yard care with pain <5/10  4) pt can go for 30 minute motorcycle ride without danger or limitation    Plan    Therapy options: will be seen for skilled physical therapy services    Planned therapy interventions: Body Mechanics Training, Manual Therapy, Neuromuscular Re-education, Therapeutic Activities, Therapeutic Exercises, Education - Patient, Home Exercise Program and Postural Training      Frequency: 1x week    Duration in weeks: 8-12    Education provided to: patient.    Education provided: Anatomy, Back care, Body mechanics, Importance of Therapy, HEP and Posture    Education results: needs reinforcement.      Next visit plan:        Follow up on HEP    Progress stretching    Progress strengthening    Total Session Time: 35      Subjective:   History of Present Condition      History of Present Condition/Chief Complaint:       M54.50,G89.29 (ICD-10-CM) - Chronic bilateral low back pain, unspecified whether sciatica present  =========================  MD Plan -   Plan:  - Obtain Hgb A1c, Vit B12, ANA, ENA  - Repeat EMG  - Obtain PVL Venous and Arterial Duplex of BLE  - Refer to PT for evaluation of strength and balance  - Continue Cymbalta 60 mg nightly  - Start Nortriptyline 10 mg nightly with potential to increase further based on tolerance  - Refer to Dr. Fabian November for painful neuropathy clinic    Subjective:  TODAY (visit 3) - pain is about the same but may involve both sides of the low back now. He can get up from the floor better now which is good, doing HEP regularly.     Pain - 5-6/10 - R low back mostly today.    INITIAL  72 y.o. male with complex medical history including (but not limited to) liver transplant and back surgery. Sent to PT for strengthening and balance.   Approximately 5-6 years ago pt fell ~30-40' from a tree. Fracture back, leg, ankle. Had 3-back surgeries as a result. He has ongoing pinched nerve in R back and SIJ area, he also has neuropathy. Since the surgeries he has not had outpatient PT, when seeing neurologist he was referred to PT.     He did fall 3 - 4x last year.    Pain  Current pain rating: 6  At best pain rating: 5  At worst pain rating: 10  Location: R low back / gluteal area, both legs and feet (neuropathy)  Alleviating factors: tylenol.  Exacerbated by: worse in the morning; bend over when standing; getting up after sitting > 30 minutes; sitting > 30 minutes.  Red flags: none.    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 Upright.  FINDINGS:  Posterior spinal hardware spans T10- L2 with cerclage wires at L1 spanning T12 compression fracture. Similar height loss T12 compared to prior exam. Other vertebral body heights preserved.    Posterior spinal fusion hardware from L5-S1 is unchanged. There is perihardware lucency around the left S1 screw. Multilevel intervertebral disc space narrowing throughout the lumbar spine, most severe between L5-S1. Similar grade 2 anterolisthesis of L5 on S1.  Mild lumbar levoscoliosis. Mild sacroiliac sclerosis.  Extensive surgical clips throughout the abdomen.        Patient Goals  Patient/family treatment goals: ride motorcycle without pain or limitation; get back to dating; walk without pain.    Objective:   Objective  INITIAL  Observations:   General:  patient is well appearing male in NAD, appears stated age, agreeable to PT examination  Skin assessment/Edema: visible area intact  Posture:  Normal Sitting Posture: fair -- uncomfortable after 15 minutes (moving around, shifting)    Normal Standing Posture: knees flexed, hips flexed, low back flexed, fwd head and shoulders    Symptom changes with standing posture movements --   --Standing Upright: slifghtly better  -- Standing Slouched: worse  Gait Analysis:   slow pace, R side antalgic steps, no arm swing, short steps    Range of Motion:  Spine    RIGHT LEFT         Lumbar   Flexion   Limited 50%, pain    Extension   Limited 75%, pain    Rotation   Unable - p! Unable - p!    Sidebend   painful Painful more    Hamstrings   (-) 40 (-) 40       HIPS Ext. Rotation 35 35    Internal Rotation 10 8      Repeated Movements Testing:  NT  Neurological Exam:    TUG - 14.41 second  30-second sit to stand - 8 rep (with hands)   ===========================  Treatment Provided Today:  EXERCISE PLAN --   Do the current HEP every other day -- ALTERNATE with the program given today --> purpose is to accelerate the strength effects.    Circuit through these 2--   -Review of Bridge - 2x10  - SLR - 2x10 ea  ------------------------------------    Row with band - (purple) - 2x15 - hep  Shoulder extension with band - (prple) 2x15  Sit to stand - 1x10 - hep  Bilateral heel raises - 1x10 - hep  Standing hip abduction - 1x10 ea hep    Progressive List of Home Exercises:  VISIT # EXERCISES COMMENTS   1 Hip ER, LTR, SKTC, bridge All good in clinic, sore   2 1/18 - hams.stretch supine; SLR    3 2/1 **initial HEP is 1-program to alternate days with today's program**  Row with band  Shld ext with band  Heel raise  Sit to stand  Standing hip abd Tiring, pt enthusiastic to work  hard   4     5     6     7        Manual Therapy - 0 minutes:    Therapeutic Exercise / Home Exercise Program - 35 minutes:  Patient was educated about proper performance of HEP exercises and was observed performing them correctly.  Patient was given a written handout of exercises for the HEP.      Total Treatment Time: 35 minutes    Patient Education:   Throughout evaluation patient educated regarding the following: Role of PT in Rehabilitation, HEP, importance of therapy, posture, body awareness, treatment plan, and back care. Patient demonstrated and verbalized agreement and understanding.    Communication/consultation with other professionals:   medicare Cert/POC sent to referring practitioner.  Equipment provided/recommended:   N/A.                    I attest that I have reviewed the above information.  SignedKelton Pillar Ingvald Theisen, PT  04/14/2022 1:50 PM

## 2022-04-23 DIAGNOSIS — Z944 Liver transplant status: Principal | ICD-10-CM

## 2022-04-23 DIAGNOSIS — D849 Immunodeficiency, unspecified: Principal | ICD-10-CM

## 2022-04-23 MED ORDER — CYCLOSPORINE MODIFIED 100 MG CAPSULE
ORAL_CAPSULE | ORAL | 3 refills | 90 days
Start: 2022-04-23 — End: 2023-04-23

## 2022-04-23 MED ORDER — CYCLOSPORINE MODIFIED 25 MG CAPSULE
ORAL_CAPSULE | ORAL | 3 refills | 0 days
Start: 2022-04-23 — End: 2023-04-23

## 2022-04-25 MED ORDER — CYCLOSPORINE MODIFIED 25 MG CAPSULE
ORAL_CAPSULE | ORAL | 3 refills | 0 days | Status: CP
Start: 2022-04-25 — End: 2023-04-25
  Filled 2022-05-05: qty 90, 30d supply, fill #0

## 2022-04-25 MED ORDER — CYCLOSPORINE MODIFIED 100 MG CAPSULE
ORAL_CAPSULE | ORAL | 3 refills | 90 days | Status: CP
Start: 2022-04-25 — End: 2023-04-25

## 2022-04-25 NOTE — Unmapped (Signed)
The patient is requesting a medication refill

## 2022-05-02 ENCOUNTER — Ambulatory Visit
Admit: 2022-05-02 | Payer: MEDICARE | Attending: Rehabilitative and Restorative Service Providers" | Primary: Rehabilitative and Restorative Service Providers"

## 2022-05-02 ENCOUNTER — Ambulatory Visit
Admit: 2022-04-28 | Payer: MEDICARE | Attending: Rehabilitative and Restorative Service Providers" | Primary: Rehabilitative and Restorative Service Providers"

## 2022-05-02 NOTE — Unmapped (Signed)
[facility]  OUTPATIENT PHYSICAL THERAPY  05/02/2022          Patient Name: Jeffrey Ward  Date of Birth:04/01/1950  Diagnosis:   Encounter Diagnosis   Name Primary?   ??? Chronic bilateral low back pain with sciatica, sciatica laterality unspecified Yes     Referring MD:  Florian Buff, MD     Date of Onset of Impairment-No date available  Date PT Care Plan Established or Reviewed-03/18/2022  Date PT Treatment Started-03/18/2022   Plan of Care Effective Date: 03/18/2022 - 06/16/2022         Assessment/Plan:    Assessment  Assessment details:    TODAY - minimal changes, doing HEP, still needs significant strength gain to ride motorcycle. Pain and limited motion persist.    INITIAL -   72 y.o. male with complex pmhx, multiple trauma ~5 years ago from 54' fall, multi-level fusion t/s and L-S spines. Here for back pain, leg weakness, falling, lower activity. Initiated HEP today          Impairments: decreased range of motion, decreased strength, decreased mobility, impaired ADLs, pain and postural weakness      Personal Factors/Comorbidities: 1-2      Examination of Body Systems: activity/participation and musculoskeletal    Clinical Presentation: stable    Clinical Decision Making: complex    Prognosis: excellent prognosis    Positive Prognosis Rationale: behavior, motivated for treatment and response to trial tx.      Therapy Goals      Goals:      1) Patient will demonstrate self-efficacy through their independence in performing the home exercise program as demonstrated by verbal and/or physical performance confirmation at each session or subsequent session that HEP is given  2) Patient is able to walk 30-45 minutes without rest  3) Pt is able to do ADLs around house and yard care with pain <5/10  4) pt can go for 30 minute motorcycle ride without danger or limitation    Plan    Therapy options: will be seen for skilled physical therapy services    Planned therapy interventions: Body Mechanics Training, Manual Therapy, Neuromuscular Re-education, Therapeutic Activities, Therapeutic Exercises, Education - Patient, Home Exercise Program and Postural Training      Frequency: 1x week    Duration in weeks: 8-12    Education provided to: patient.    Education provided: Anatomy, Back care, Body mechanics, Importance of Therapy, HEP and Posture    Education results: needs reinforcement.      Next visit plan:        Follow up on HEP    Progress stretching    Progress strengthening    Total Session Time: 40      Subjective:   History of Present Condition      History of Present Condition/Chief Complaint:       M54.50,G89.29 (ICD-10-CM) - Chronic bilateral low back pain, unspecified whether sciatica present  =========================  MD Plan -   Plan:  - Obtain Hgb A1c, Vit B12, ANA, ENA  - Repeat EMG  - Obtain PVL Venous and Arterial Duplex of BLE  - Refer to PT for evaluation of strength and balance  - Continue Cymbalta 60 mg nightly  - Start Nortriptyline 10 mg nightly with potential to increase further based on tolerance  - Refer to Dr. Fabian November for painful neuropathy clinic    Subjective:     TODAY (visit 4) - pt returns after couple weeks;  he fell last week, indoors, tripped on a rug, did not hit his head, no bleeding. Still pretty sore since the fall last week, had to cancel PT last week. Doing the HEP -- standing on R leg with L leg motion the R leg wants to give way -- uses arms to help as needed.     Pain - 7/10    INITIAL  72 y.o. male with complex medical history including (but not limited to) liver transplant and back surgery. Sent to PT for strengthening and balance.   Approximately 5-6 years ago pt fell ~30-40' from a tree. Fracture back, leg, ankle. Had 3-back surgeries as a result. He has ongoing pinched nerve in R back and SIJ area, he also has neuropathy. Since the surgeries he has not had outpatient PT, when seeing neurologist he was referred to PT.     He did fall 3 - 4x last year.    Pain  Current pain rating: 7  At best pain rating: 5  At worst pain rating: 10  Location: R low back / gluteal area, both legs and feet (neuropathy)  Alleviating factors: tylenol.  Exacerbated by: worse in the morning; bend over when standing; getting up after sitting > 30 minutes; sitting > 30 minutes.  Red flags: none.    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 Upright.  FINDINGS:  Posterior spinal hardware spans T10- L2 with cerclage wires at L1 spanning T12 compression fracture. Similar height loss T12 compared to prior exam. Other vertebral body heights preserved.    Posterior spinal fusion hardware from L5-S1 is unchanged. There is perihardware lucency around the left S1 screw. Multilevel intervertebral disc space narrowing throughout the lumbar spine, most severe between L5-S1. Similar grade 2 anterolisthesis of L5 on S1.  Mild lumbar levoscoliosis. Mild sacroiliac sclerosis.  Extensive surgical clips throughout the abdomen.        Patient Goals  Patient/family treatment goals: ride motorcycle without pain or limitation; get back to dating; walk without pain.    Objective:   Objective  INITIAL  Observations:   General:  patient is well appearing male in NAD, appears stated age, agreeable to PT examination  Skin assessment/Edema: visible area intact  Posture:  Normal Sitting Posture: fair -- uncomfortable after 15 minutes (moving around, shifting)    Normal Standing Posture: knees flexed, hips flexed, low back flexed, fwd head and shoulders    Symptom changes with standing posture movements --   --Standing Upright: slifghtly better  -- Standing Slouched: worse  Gait Analysis:   slow pace, R side antalgic steps, no arm swing, short steps    Range of Motion:  Spine    RIGHT LEFT         Lumbar   Flexion   Limited 50%, pain    Extension   Limited 75%, pain    Rotation   Unable - p! Unable - p!    Sidebend   painful Painful more    Hamstrings   (-) 40 (-) 40       HIPS Ext. Rotation 35 35  Internal Rotation 10 8      Repeated Movements Testing:   NT  Neurological Exam:    TUG - 14.41 second  30-second sit to stand - 8 rep (with hands)   ===========================  Treatment Provided Today:  Repeat HEP today due to missing last week -   Supine hip ER - 2x10  LTR x10 ea    Bridge - 2x15  SLR - 2x10 ea    Sit to stand - 3x10   Bilateral heel raises - 1x20   Standing hip abduction - 1x15 ea --- verbal cues to face counter, raise leg LESS high (no body lean)    Hand weight bicep curls (3lbs ea) - 1x 25  Bicep curls 4lbs - 1x20  Shrugs - 4lbs 1x20    Cable column -- row and chest press   --row 30lbs - 2x10  --chest press 20lbs 1x10  --walk backward and forward (Cgassist) - 1x5    Progressive List of Home Exercises:  VISIT # EXERCISES COMMENTS   1 Hip ER, LTR, SKTC, bridge All good in clinic, sore   2 1/18 - hams.stretch supine; SLR    3 2/1 **initial HEP is 1-program to alternate days with today's program**  Row with band  Shld ext with band  Heel raise  Sit to stand  Standing hip abd Tiring, pt enthusiastic to work  hard   4 2/19 - can add bicep curls and shrugs if desired    5     6     7        Manual Therapy - 0 minutes:    Therapeutic Exercise / Home Exercise Program - 40 minutes:  Patient was educated about proper performance of HEP exercises and was observed performing them correctly.  Patient was given a written handout of exercises for the HEP.      Total Treatment Time: 40 minutes    Patient Education:   Throughout evaluation patient educated regarding the following: Role of PT in Rehabilitation, HEP, importance of therapy, posture, body awareness, treatment plan, and back care. Patient demonstrated and verbalized agreement and understanding.    Communication/consultation with other professionals:   medicare Cert/POC sent to referring practitioner.  Equipment provided/recommended:   N/A.                    I attest that I have reviewed the above information.  SignedKelton Pillar Pranika Finks, PT  05/02/2022 1:48 PM

## 2022-05-04 DIAGNOSIS — I272 Pulmonary hypertension, unspecified: Principal | ICD-10-CM

## 2022-05-04 DIAGNOSIS — Z944 Liver transplant status: Principal | ICD-10-CM

## 2022-05-04 NOTE — Unmapped (Signed)
[facility] Refill Coordination Note    Specialty Medication(s) to be Shipped:   Transplant: mycophenolate mofetil 250mg , cyclosporine 100mg , and cyclosporine 25mg     Other medication(s) to be shipped:  duloxetine , mag-ox , nortriptyline, sildenafil, rosuvastatin, pen needles, basaglar      Jeffrey Ward, DOB: 21-Jun-1950  Phone: [phone] (home)       All above HIPAA information was verified with patient.     Was a Nurse, learning disability used for this call? No    Completed refill call assessment today to schedule patient's medication shipment from the [facility] [phone]).  All relevant notes have been reviewed.     Specialty medication(s) and dose(s) confirmed: Regimen is correct and unchanged.   Changes to medications: Bryant reports no changes at this time.  Changes to insurance: No  New side effects reported not previously addressed with a pharmacist or physician: None reported  Questions for the pharmacist: No    Confirmed patient received a Conservation officer, historic buildings and a Surveyor, mining with first shipment. The patient will receive a drug information handout for each medication shipped and additional FDA Medication Guides as required.       DISEASE/MEDICATION-SPECIFIC INFORMATION        N/A    SPECIALTY MEDICATION ADHERENCE     Medication Adherence    Patient reported X missed doses in the last month: 0  Specialty Medication: cycloSPORINE modified 100 MG capsule (NeoraL)  Patient is on additional specialty medications: Yes  Additional Specialty Medications: cycloSPORINE modified 25 MG capsule (NeoraL)  Patient Reported Additional Medication X Missed Doses in the Last Month: 0  Patient is on more than two specialty medications: Yes  Specialty Medication: mycophenolate 250 mg capsule (CELLCEPT)  Patient Reported Additional Medication X Missed Doses in the Last Month: 0  Support network for adherence: family member              Were doses missed due to medication being on hold? No    mycophenolate 250 mg: 5 days of medicine on hand   cyclosporine 100 mg: 5 days of medicine on hand   Cyclosporine  25 mg: 5 days of medicine on hand        REFERRAL TO PHARMACIST     Referral to the pharmacist: Not needed      [facility]     Shipping address confirmed in Epic.     Delivery Scheduled: Yes, Expected medication delivery date: 05/04/22.     Medication will be delivered via Same Day Courier to the prescription address in Epic WAM.    [place] A Lavaya Defreitas   [facility] Specialty Technician

## 2022-05-05 MED FILL — CYCLOSPORINE MODIFIED 100 MG CAPSULE: ORAL | 30 days supply | Qty: 90 | Fill #0

## 2022-05-05 MED FILL — ROSUVASTATIN 5 MG TABLET: ORAL | 90 days supply | Qty: 90 | Fill #2

## 2022-05-05 MED FILL — DULOXETINE 30 MG CAPSULE,DELAYED RELEASE: ORAL | 30 days supply | Qty: 60 | Fill #4

## 2022-05-05 MED FILL — NORTRIPTYLINE 10 MG CAPSULE: ORAL | 30 days supply | Qty: 30 | Fill #1

## 2022-05-05 MED FILL — BASAGLAR KWIKPEN U-100 INSULIN 100 UNIT/ML (3 ML) SUBCUTANEOUS: SUBCUTANEOUS | 107 days supply | Qty: 15 | Fill #0

## 2022-05-05 MED FILL — MYCOPHENOLATE MOFETIL 250 MG CAPSULE: ORAL | 30 days supply | Qty: 120 | Fill #2

## 2022-05-05 MED FILL — ULTICARE PEN NEEDLE 32 GAUGE X 5/32" (4 MM): 50 days supply | Qty: 100 | Fill #1

## 2022-05-05 NOTE — Unmapped (Signed)
Completed PA for Sildenafil on covermymeds.com. Waiting for a determination.

## 2022-05-06 NOTE — Unmapped (Signed)
Received notification from [facility] that med was approved.     Medication (Brand/Generic): Sildenafil 20mg  tablet   Insurance Company: [employer] [place]   Authorization ID/Case Number: [id number]   Coverage Dates: 05/06/2022 through 05/07/2023

## 2022-05-13 NOTE — Unmapped (Signed)
Attempted to prep for annual post liver txp follow up scheduled with NP Metheny on Monday 3/4 at [facility] clinic. Left vm requesting a return call.

## 2022-05-16 ENCOUNTER — Ambulatory Visit: Admit: 2022-05-16 | Discharge: 2022-05-17 | Payer: MEDICARE

## 2022-05-16 DIAGNOSIS — Z944 Liver transplant status: Principal | ICD-10-CM

## 2022-05-16 DIAGNOSIS — R634 Abnormal weight loss: Principal | ICD-10-CM

## 2022-05-16 DIAGNOSIS — N529 Male erectile dysfunction, unspecified: Principal | ICD-10-CM

## 2022-05-16 NOTE — Unmapped (Unsigned)
FOLLOW UP ANNUAL LIVER CLINIC NOTE     Patient Name: Jeffrey Ward  Medical Record Number: [id number]  Date of Service: 05/16/2022    Referring Physician: Info Unavailable Referr*   Current complaint: Follow up Annual Liver    Assessment/Plan:     Jeffrey Ward is a 72 y.o. 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  Recent imaging stable with continued abscess improvement, no HCC. Patient has been taking 200mg /175mg  of cyclosporine and 500mg  BID of cellcept; immunosuppressive level goal range (100-125).       Wife passed in December 2022, dealing with this as expected.     HEALTH MAINTENANCE:   - Dermatology: This patient is at increased risk for the development of skin cancers due to immunosuppressant medications. We recommend yearly surveillance.  - Colonoscopy: Until age 71, we recommend routine surveillance.   - Dental: no issues  - Mental health: no overt issues  Immunization History   Administered Date(s) Administered    INFLUENZA TIV (TRI) 15MO+ W/ PRESERV (IM) 01/07/2014    Influenza Vaccine Quad(IM)6 MO-Adult(PF) 12/23/2015, 01/12/2017, 12/28/2017    Influenza Virus Vaccine, unspecified formulation 01/07/2014, 12/28/2017    PNEUMOCOCCAL POLYSACCHARIDE 23-VALENT 01/20/2011, 03/01/2017    TdaP 01/12/2009, 05/17/2012    ZOSTAVAX - ZOSTER VACCINE, LIVE, SQ 05/17/2012       Defers all vaccines, may consider ShingRx.       Reeducated patient on the importance of preventing illness, infection, and opportunistic cancers and the rationale behind vaccines and routine imaging, given his status as an immunocompromised person.     Return to clinic: 1 year  Labs: monthly      Subjective:     Jeffrey Ward is a 72 y.o. male with history of HCV cirrhosis and HCC s/p OLT 09/15/2016 c/b multiple medical problems post-operatively, including hepatic lesions inevitably determined to be abscesses s/p antibiotic therapy and broken hip s/p surgical repair. He additionally, given his multiple intubations, presumed traumatic, he suffered a right vocal fold paralysis and underwent 03/2018 endoscopic CO2 laser and injection for breathy voice and glottal insufficiency with minimal improvement to symptoms. We additionally referred patient to nephrology for CKD III management.    Denies fever, chills, weight loss/gain, and fatigue. Denies chest pain, SOB, N/V/D, constipation or abdominal pain. No acute complaints today. His wife passed in December 2022, after a lengthy illness, he was her primary caretaker. Feels sad, but okay.        Past Medical History:   Diagnosis Date    Atrial fibrillation (CMS-HCC)     Basal cell carcinoma     Cancer (CMS-HCC)     Cirrhosis (CMS-HCC)     Depression     Diabetes (CMS-HCC)     Headache     Hepatitis C 07/17/2012    History of transfusion     Liver disease     Low back pain 07/17/2012    Varices, esophageal (CMS-HCC)        Past Surgical History:   Procedure Laterality Date    ANKLE SURGERY      BACK SURGERY      BACK SURGERY      CARDIAC SURGERY      pacemaker    CHG US GUIDE, TISSUE ABLATION N/A 01/22/2016    Procedure: ULTRASOUND GUIDANCE FOR, AND MONITORING OF, PARENCHYMAL TISSUE ABLATION;  Surgeon: Particia Nearing, MD;  Location: MAIN OR [facility];  Service: Transplant    IR EMBOLIZATION ORGAN ISCHEMIA, TUMORS, INFAR  06/16/2016  IR EMBOLIZATION ORGAN ISCHEMIA, TUMORS, INFAR 06/16/2016 Ammie Dalton, MD IMG VIR H&V [facility]    PR COLON CA SCRN NOT HI RSK IND N/A 02/27/2015    Procedure: COLOREC CNCR SCR;COLNSCPY NO;  Surgeon: Vonda Antigua, MD;  Location: GI PROCEDURES [facility];  Service: Gastroenterology    PR ENDOSCOPIC ULTRASOUND EXAM N/A 02/27/2015    Procedure: UGI ENDO; W/ENDO ULTRASOUND EXAM INCLUDES ESOPHAGUS, STOMACH, &/OR DUODENUM/JEJUNUM;  Surgeon: Vonda Antigua, MD;  Location: GI PROC[facility]URES [facility];  Service: Gastroenterology    PR ERCP BALLOON DILATE BILIARY/PANC DUCT/AMPULLA EA N/A 02/10/2017    Procedure: ERCP;WITH TRANS-ENDOSCOPIC BALLOON DILATION OF BILIARY/PANCREATIC DUCT(S) OR OF AMPULLA, INCLUDING SPHINCTERECTOMY, WHEN PERFOREMD,EACH DUCT (16109);  Surgeon: Mayford Knife, MD;  Location: GI PROC[facility]URES [facility];  Service: Gastroenterology    PR ERCP REMOVE FOREIGN BODY/STENT BILIARY/PANC DUCT N/A 02/10/2017    Procedure: ENDOSCOPIC RETROGRADE CHOLANGIOPANCREATOGRAPHY (ERCP); W/ REMOVAL OF FOREIGN BODY/STENT FROM BILIARY/PANCREATIC DUCT(S);  Surgeon: Mayford Knife, MD;  Location: GI PROCEDURES [facility];  Service: Gastroenterology    PR ERCP REMOVE FOREIGN BODY/STENT BILIARY/PANC DUCT N/A 06/29/2017    Procedure: ENDOSCOPIC RETROGRADE CHOLANGIOPANCREATOGRAPHY (ERCP); W/ REMOVAL OF FOREIGN BODY/STENT FROM BILIARY/PANCREATIC DUCT(S);  Surgeon: Vonda Antigua, MD;  Location: GI PROCEDURES [facility];  Service: Gastroenterology    PR ERCP STENT PLACEMENT BILIARY/PANCREATIC DUCT N/A 11/29/2016    Procedure: ENDOSCOPIC RETROGRADE CHOLANGIOPANCREATOGRAPHY (ERCP); WITH PLACEMENT OF ENDOSCOPIC STENT INTO BILIARY OR PANCREATIC DUCT;  Surgeon: Chriss Driver, MD;  Location: GI PROCEDURES [facility];  Service: Gastroenterology    PR ERCP STENT PLACEMENT BILIARY/PANCREATIC DUCT N/A 04/26/2017    Procedure: ENDOSCOPIC RETROGRADE CHOLANGIOPANCREATOGRAPHY (ERCP); WITH PLACEMENT OF ENDOSCOPIC STENT INTO BILIARY OR PANCREATIC DUCT;  Surgeon: Mayford Knife, MD;  Location: GI PROC[facility]URES [facility];  Service: Gastroenterology    PR ERCP,W/REMOVAL STONE,BIL/PANCR DUCTS N/A 11/29/2016    Procedure: ERCP; W/ENDOSCOPIC RETROGRADE REMOVAL OF CALCULUS/CALCULI FROM BILIARY &/OR PANCREATIC DUCTS;  Surgeon: Chriss Driver, MD;  Location: GI PROC[facility]URES [facility];  Service: Gastroenterology    PR ERCP,W/REMOVAL STONE,BIL/PANCR DUCTS N/A 02/10/2017    Procedure: ERCP; W/ENDOSCOPIC RETROGRADE REMOVAL OF CALCULUS/CALCULI FROM BILIARY &/OR PANCREATIC DUCTS;  Surgeon: Mayford Knife, MD;  Location: GI PROCEDURES [facility];  Service: Gastroenterology PR ERCP,W/REMOVAL STONE,BIL/PANCR DUCTS N/A 04/26/2017    Procedure: ERCP; W/ENDOSCOPIC RETROGRADE REMOVAL OF CALCULUS/CALCULI FROM BILIARY &/OR PANCREATIC DUCTS;  Surgeon: Mayford Knife, MD;  Location: [facility] PROC[facility]URES [facility];  Service: Gastroenterology    PR ERCP,W/REMOVAL STONE,BIL/PANCR DUCTS N/A 06/29/2017    Procedure: ERCP; W/ENDOSCOPIC RETROGRADE REMOVAL OF CALCULUS/CALCULI FROM BILIARY &/OR PANCREATIC DUCTS;  Surgeon: Vonda Antigua, MD;  Location: GI PROCEDURES [facility];  Service: Gastroenterology    PR ESOPHAGOSCP RIG TRANSORAL HYPOPHARYNX CRV ESOPH N/A 04/04/2018    Procedure: ESOPHAGOSCOPY, RIGID, TRANSORAL WITH DIVERTICULECTOMY OF HYPOPHARYNX OR CERVICAL ESOPHAGUS, WITH CRICOPHARYNGEAL MYOTOMY, INCLUDES USE OF TELESCOPE OR OPERATING MICROSCOPE AND REPAIR, WHEN PERFORMED;  Surgeon: Vista Deck, MD;  Location: MAIN OR [facility];  Service: ENT    PR INSER HEART TEMP PACER ONE CHMBR N/A 10/02/2016    Procedure: Tempoarary Pacemaker Insertion;  Surgeon: Meredith Leeds, MD;  Location: [facility];  Service: Cardiology    PR LAP,DIAGNOSTIC ABDOMEN N/A 01/22/2016    Procedure: Laparoscopy, Abdomen, Peritoneum, & Omentum, Diagnostic, W/Wo Collection Specimen(S) By Brushing Or Washing;  Surgeon: Particia Nearing, MD;  Location: MAIN OR [facility];  Service: Transplant    PR LARYNGOPLASTY MEDIALIZATION Neysa Bonito N/A 11/07/2018    Procedure:  R31 LARYNGOPLASTY, MEDIALIZATION, UNILATERAL;  Surgeon: Vista Deck, MD;  Location: MAIN OR [facility];  Service: ENT    PR PLACE PERCUT GASTROSTOMY TUBE N/A 11/17/2016    Procedure: UGI ENDO; W/DIRECTED PLCMT PERQ GASTROSTOMY TUBE;  Surgeon: Cletis Athens, MD;  Location: GI PROC[facility]URES [facility];  Service: Gastroenterology    PR TRACHEOSTOMY, PLANNED N/A 09/29/2016    Procedure: TRACHEOSTOMY PLANNED (SEPART PROC);  Surgeon: Katherina Mires, MD;  Location: MAIN OR [facility];  Service: Trauma    PR TRANSCATH INSERT OR REPLACE LEADLESS PM VENTR N/A 10/11/2016    Procedure: Pacemaker Implant/Replace Leadless;  Surgeon: Meredith Leeds, MD;  Location: [facility];  Service: Cardiology    PR TRANSPLANT LIVER,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    Procedure: Liver Allotransplantation; Orthotopic, Partial Or Whole, From Cadaver Or Living Donor, Any Age;  Surgeon: Doyce Loose, MD;  Location: MAIN OR [facility];  Service: Transplant    PR TRANSPLANT,PREP DONOR LIVER, WHOLE N/A 09/15/2016    Procedure: Rogelia Boga Std Prep Cad Donor Whole Liver Gft Prior Tnsplnt,Inc Chole,Diss/Rem Surr Tissu Wo Triseg/Lobe Splt;  Surgeon: Doyce Loose, MD;  Location: [facility] OR [facility];  Service: Transplant    PR UPPER GI ENDOSCOPY,BIOPSY N/A 07/17/2012    Procedure: UGI ENDOSCOPY; WITH BIOPSY, SINGLE OR MULTIPLE;  Surgeon: Alba Destine, MD;  Location: GI PROCEDURES [facility];  Service: Gastroenterology    PR UPPER GI ENDOSCOPY,DIAGNOSIS N/A 02/04/2014    Procedure: UGI ENDO, INCLUDE ESOPHAGUS, STOMACH, & DUODENUM &/OR JEJUNUM; DX W/WO COLLECTION SPECIMN, BY BRUSH OR WASH;  Surgeon: Wilburt Finlay, MD;  Location: GI PROCEDURES [facility];  Service: Gastroenterology    PR UPPER GI ENDOSCOPY,LIGAT VARIX N/A 11/05/2013    Procedure: UGI ENDO; Everlene Balls LIG ESOPH &/OR GASTRIC VARICES;  Surgeon: Wilburt Finlay, MD;  Location: GI PROCEDURES [facility];  Service: Gastroenterology       Family History   Problem Relation Age of Onset    Hypertension Mother     Cancer Mother     Heart disease Mother     Heart disease Father     Cirrhosis Neg Hx     Liver cancer Neg Hx     Anemia Neg Hx     Diabetes Neg Hx     Kidney disease Neg Hx     Obesity Neg Hx     Thyroid disease Neg Hx     Osteoporosis Neg Hx     Coronary artery disease Neg Hx     Anesthesia problems Neg Hx     Basal cell carcinoma Neg Hx     Squamous cell carcinoma Neg Hx        Social History     Socioeconomic History    Marital status: Widowed   Tobacco Use    Smoking status: Never    Smokeless tobacco: Never    Tobacco comments:     Smoked in high school for about 5 years.    Substance and Sexual Activity    Alcohol use: No    Drug use: No    Sexual activity: Yes     Partners: Female     Birth control/protection: None   Other Topics Concern    Exercise No    Living Situation No       REVIEW OF SYSTEMS:   The balance of 10/12 systems is negative with the exception of HPI.    Objective:     MEDICATIONS:  No Known Allergies    Current Outpatient  Medications   Medication Sig Dispense Refill    BASAGLAR KWIKPEN U-100 INSULIN 100 unit/mL (3 mL) injection pen Inject 0.14 mL (14 Units total) under the skin at bedtime. 15 mL 4    BASAGLAR KWIKPEN U-100 INSULIN 100 unit/mL (3 mL) injection pen Inject 14 Units subcutaneously at bedtime 15 mL 4    blood-glucose meter (GLUCOSE MONITORING KIT) kit Use as instructed 1 each 0    cholecalciferol, vitamin D3, 125 mcg (5,000 unit) tablet Take 1 tablet (125 mcg total) by mouth daily.      cycloSPORINE modified (NEORAL) 100 MG capsule Take 2 capsules (200 mg total) by mouth daily AND 1 capsule (100 mg total) nightly. 270 capsule 3    cycloSPORINE modified (NEORAL) 25 MG capsule Take 3 capsules (75 mg total) by mouth nightly (with a 100mg  capsule). For a total dose of 200 mg in the morning , 175mg  every evening 270 capsule 3    DULoxetine (CYMBALTA) 30 MG capsule Take 2 capsules (60 mg total) by mouth nightly. 60 capsule 5    insulin glargine (BASAGLAR, LANTUS) 100 unit/mL (3 mL) injection pen Inject 0.14 mL (14 Units total) under the skin two (2) times a day. 15 mL 3    insulin glargine (BASAGLAR, LANTUS) 100 unit/mL (3 mL) injection pen Inject 0.14 mL (14 Units total) under the skin at bedtime. 15 mL 2    magnesium oxide-Mg AA chelate (MAGNESIUM, AMINO ACID CHELATE,) 133 mg Take 2 tablets by mouth Two (2) times a day. 120 tablet PRN    melatonin 3 mg Tab Take 1 tablet (3 mg total) by mouth nightly. 30 tablet 0    metoprolol succinate (TOPROL-XL) 25 MG 24 hr tablet Take 1 tablet (25 mg total) by mouth.      mycophenolate (CELLCEPT) 250 mg capsule Take 2 capsules (500 mg total) by mouth two (2) times a day. 120 capsule 11    nortriptyline (PAMELOR) 10 MG capsule Take 1 capsule (10 mg total) by mouth nightly. 30 capsule 11    pen needle, diabetic (PEN NEEDLE) 32 gauge x 5/32 (4 mm) Ndle Use with insulin twice a day 200 each 1    rosuvastatin (CRESTOR) 5 MG tablet Take 1 tablet (5 mg total) by mouth daily. 90 tablet 3    sildenafiL, pulm.hypertension, (REVATIO) 20 mg tablet Take 2 tablets (40 mg total) by mouth Three (3) times a day. 180 tablet 6    tamsulosin (FLOMAX) 0.4 mg capsule Take 1 capsule (0.4 mg total) by mouth daily. 90 capsule 0    mirtazapine (REMERON) 15 MG tablet Take 1 tablet (15 mg total) by mouth nightly. 90 tablet 3    pen needle, diabetic (ULTICARE PEN NEEDLE) 32 gauge x 5/32 (4 mm) Ndle USE WITH INSULIN PEN 4 TIMES DAILY AS DIRECTED 100 each 1     No current facility-administered medications for this visit.         PHYSICAL EXAM:  BP 121/74  - Pulse 67  - Temp 37.1 ??C (98.7 ??F) (Temporal)  - Ht 175.3 cm (5' 9)  - Wt 63.4 kg (139 lb 12.8 oz) Comment: with shoes - SpO2 100%  - BMI 20.64 kg/m??        Physical Exam:  General Appearance:   No acute distress  Lungs:                          Clear to auscultation bilaterally  Heart:  Regular rate and rhythm  Abdomen:                     Soft, non-tender, non-distended, well healed scar  Extremities:                  Warm and well perfused        LAB RESULTS:      IMAGING:

## 2022-05-16 NOTE — Unmapped (Addendum)
Increase your food intake, try protein bars, peanut butter, etc.   Increase your protein intake by eating eggs, chicken, fish, yogurt and/or supplements.       I have ordered a colonoscopy for you and I referred you to urology.        Increase your protein intake by eating eggs, chicken, fish, yogurt and/or supplements.       It is OK to take up to 2000mg  of acetaminophen (tylenol), please be cautious of combination products. Do not take ibuprofen, naproxen, aspirin, advil, aleve, motrin, Excedrin or headache powders. Avoid herbal or natural supplements.      Please contact my nurse, Josie Dixon, RN at [phone] If you have questions or concerns or need to get in touch with me.       Please Return to clinic in 1 year       If you have any questions or concerns please contact us on MyChart or as below:     For appointments, please call [phone].  For scheduling radiology appointments [phone].  For scheduling GI procedures (e.g,. Colonoscopy), please call [phone].  For emergencies after hours call [phone] and ask for the GI medicine fellow on call.          Vallery Sa, NP-C  [facility]  [address] [place], [place] [address]  [place], [place] [address]  Ph: [phone]  Fax: [phone]

## 2022-05-18 MED FILL — SILDENAFIL (PULMONARY HYPERTENSION) 20 MG TABLET: ORAL | 30 days supply | Qty: 180 | Fill #4

## 2022-05-31 NOTE — Unmapped (Signed)
[facility] Refill Coordination Note    Specialty Medication(s) to be Shipped:   Transplant: mycophenolate mofetil 250mg , cyclosporine 100mg , and cyclosporine 25mg     Other medication(s) to be shipped:  Duloxetine, Magnesium, Nortriptyline , sildenafil     Jeffrey Ward, DOB: 10/01/1950  Phone: [phone] (home)       All above HIPAA information was verified with patient.     Was a Nurse, learning disability used for this call? No    Completed refill call assessment today to schedule patient's medication shipment from the [facility] [phone]).  All relevant notes have been reviewed.     Specialty medication(s) and dose(s) confirmed: Regimen is correct and unchanged.   Changes to medications: Jeffrey Ward reports no changes at this time.  Changes to insurance: No  New side effects reported not previously addressed with a pharmacist or physician: None reported  Questions for the pharmacist: No    Confirmed patient received a Conservation officer, historic buildings and a Surveyor, mining with first shipment. The patient will receive a drug information handout for each medication shipped and additional FDA Medication Guides as required.       DISEASE/MEDICATION-SPECIFIC INFORMATION        N/A    SPECIALTY MEDICATION ADHERENCE     Medication Adherence    Patient reported X missed doses in the last month: 0  Specialty Medication: cycloSPORINE modified 100 MG capsule (Neoral)  Patient is on additional specialty medications: Yes  Additional Specialty Medications: cycloSPORINE modified 25 MG capsule (Neoral)  Patient Reported Additional Medication X Missed Doses in the Last Month: 0  Patient is on more than two specialty medications: Yes  Specialty Medication: mycophenolate 250 mg capsule (CELLCEPT)  Patient Reported Additional Medication X Missed Doses in the Last Month: 0  Any gaps in refill history greater than 2 weeks in the last 3 months: no  Demonstrates understanding of importance of adherence: yes  Support network for adherence: family member              Were doses missed due to medication being on hold? No    mycophenolate 250 mg capsule     : 6-7 days of medicine on hand   cycloSPORINE modified 100 MG   : 10 days of medicine on hand   cycloSPORINE modified 25 MG    : 10 days of medicine on hand        REFERRAL TO PHARMACIST     Referral to the pharmacist: Not needed      [facility]     Shipping address confirmed in Epic.     Patient was notified of new phone menu : Yes    Delivery Scheduled: Yes, Expected medication delivery date: 06/03/22.     Medication will be delivered via UPS to the prescription address in Epic WAM.    Jeffrey Ward   [facility] Specialty Technician

## 2022-06-03 MED FILL — DULOXETINE 30 MG CAPSULE,DELAYED RELEASE: ORAL | 30 days supply | Qty: 60 | Fill #5

## 2022-06-03 MED FILL — MYCOPHENOLATE MOFETIL 250 MG CAPSULE: ORAL | 30 days supply | Qty: 120 | Fill #3

## 2022-06-03 MED FILL — CYCLOSPORINE MODIFIED 25 MG CAPSULE: ORAL | 30 days supply | Qty: 90 | Fill #1

## 2022-06-03 MED FILL — CYCLOSPORINE MODIFIED 100 MG CAPSULE: ORAL | 30 days supply | Qty: 90 | Fill #1

## 2022-06-03 MED FILL — NORTRIPTYLINE 10 MG CAPSULE: ORAL | 30 days supply | Qty: 30 | Fill #2

## 2022-06-03 MED FILL — MG-PLUS-PROTEIN 133 MG TABLET: ORAL | 30 days supply | Qty: 120 | Fill #1

## 2022-06-06 ENCOUNTER — Ambulatory Visit: Admit: 2022-06-06 | Discharge: 2022-06-07 | Payer: MEDICARE

## 2022-06-06 DIAGNOSIS — N529 Male erectile dysfunction, unspecified: Principal | ICD-10-CM

## 2022-06-06 NOTE — Unmapped (Signed)
-   we will reach out to cardiology and let you know if they think you are a candidate for trimix injections

## 2022-06-06 NOTE — Unmapped (Signed)
Assessment & Plan:   Diagnosis ICD-10-CM Associated Orders   1. Erectile dysfunction, unspecified erectile dysfunction type  N52.9 Ambulatory referral to Urology      71 yo male with history of end-stage liver disease s/p liver transplant,  type 2 diabetes, atrial fibrillation, pulmonary hypertension, pyelonephritis, and acute right heart failure s/p cardiac pacemaker placement.     He's on  sildenafil 40 mg TID for pulmonary arterial hypertension with portal hypertension.     His concern today is ED which we've discussed is likely secondary to his co morbidities. We discussed we would need cardiac clearance prior to initiating any other treatments.     He's not interested in implant surgery (and likely would be poor candidate), but wants to know if he would be a candidate for trimix injections.     Will reach out to cardiology for clearance.       Reason for Visit:  Chief Complaint   Patient presents with    Erectile Dysfunction       HPI:   Jeffrey Ward is a 72 y.o. male who is being seen today to follow-up. He last saw Dr. Marcy Salvo for BPH/ ED in December. He has a history of end-stage liver disease s/p liver transplant,  type 2 diabetes, atrial fibrillation, pulmonary hypertension, pyelonephritis, and acute right heart failure s/p cardiac pacemaker placement        He is on flomax for his urinary symptoms. He denies any troubles with urination and only wants to follow up regarding his erections.     He is taking sildenafil 40 mg TID for pulmonary arterial hypertension with portal hypertension. He reports that he's able to get a very mild erection. He's widowed. He doesn't have a partner currently.Last attempt at penetrative IC was over a year ago and he was not able to penetrate.     He's not on nitroglycerin for chest pain.        Hemoglobin A1c  Hemoglobin A1c  Collected: 02/17/22 1642   Result status: Final   Resulting lab: [facility]   Reference range: 4.8 - 5.6 %   Value: 6.2 High        Today, he reports that he is doing well. Today, he denies fever, chills, chest pain, shortness of breath, cough, wheezing, nausea, vomiting, abdominal pain, flank pain, diarrhea, constipation, dysuria, hematuria, rashes, open wounds, edema, and weakness or numbness in the upper or lower extremities.       Problem List:  Patient Active Problem List   Diagnosis    Hepatitis C    Low back pain    Hematuria    Calculus of ureter    Anemia    Benign prostatic hyperplasia    Chronic pain disorder    Dysrhythmia    History of gastroesophageal reflux (GERD)    History of hepatitis C virus infection    History of substance abuse (CMS-HCC)    Hyperglycemia, unspecified    Intermittent vertigo    Microscopic hematuria    Postlaminectomy syndrome, lumbar region    Thrombocytopenia (CMS-HCC)    Vitamin D deficiency    Atrial fibrillation with RVR (CMS-HCC)    Secondary esophageal varices (CMS-HCC)    Right ventricular dilation    End-stage liver disease (CMS-HCC)    HCC (hepatocellular carcinoma) (CMS-HCC)    Paroxysmal atrial fibrillation (CMS-HCC)    Hepatic encephalopathy (CMS-HCC)    S/P liver transplant (CMS-HCC)    Pulmonary hypertension, moderate to severe (CMS-HCC)  Acute right heart failure (CMS-HCC)    Tracheostomy dependent (CMS-HCC)    Mixed level of activity delirium due to multiple etiologies, resolved    Insomnia    Peripheral neuropathy    Influenza A    Hemothorax    Gastrostomy tube in place (CMS-HCC)    H/O tracheostomy    S/P placement of cardiac pacemaker    Uncontrolled type 2 diabetes mellitus without complication, with long-term current use of insulin    Vocal cord paralysis    Zenker's diverticulum    Medtronic Mirca VR TCP Leadless Pacemaker    Other male erectile dysfunction       Social history:  Social History     Socioeconomic History    Marital status: Widowed   Tobacco Use    Smoking status: Never    Smokeless tobacco: Never    Tobacco comments:     Smoked in high school for about 5 years.    Substance and Sexual Activity    Alcohol use: No    Drug use: No    Sexual activity: Yes     Partners: Female     Birth control/protection: None   Other Topics Concern    Exercise No    Living Situation No       MEDICATIONS:   Current Outpatient Medications   Medication Sig Dispense Refill    BASAGLAR KWIKPEN U-100 INSULIN 100 unit/mL (3 mL) injection pen Inject 0.14 mL (14 Units total) under the skin at bedtime. 15 mL 4    BASAGLAR KWIKPEN U-100 INSULIN 100 unit/mL (3 mL) injection pen Inject 14 Units subcutaneously at bedtime 15 mL 4    blood-glucose meter (GLUCOSE MONITORING KIT) kit Use as instructed 1 each 0    cholecalciferol, vitamin D3, 125 mcg (5,000 unit) tablet Take 1 tablet (125 mcg total) by mouth daily.      cycloSPORINE modified (NEORAL) 100 MG capsule Take 2 capsules (200 mg total) by mouth daily AND 1 capsule (100 mg total) nightly. 270 capsule 3    cycloSPORINE modified (NEORAL) 25 MG capsule Take 3 capsules (75 mg total) by mouth nightly (with a 100mg  capsule). For a total dose of 200 mg in the morning , 175mg  every evening 270 capsule 3    DULoxetine (CYMBALTA) 30 MG capsule Take 2 capsules (60 mg total) by mouth nightly. 60 capsule 5    insulin glargine (BASAGLAR, LANTUS) 100 unit/mL (3 mL) injection pen Inject 0.14 mL (14 Units total) under the skin two (2) times a day. 15 mL 3    insulin glargine (BASAGLAR, LANTUS) 100 unit/mL (3 mL) injection pen Inject 0.14 mL (14 Units total) under the skin at bedtime. 15 mL 2    magnesium oxide-Mg AA chelate (MAGNESIUM, AMINO ACID CHELATE,) 133 mg Take 2 tablets by mouth Two (2) times a day. 120 tablet PRN    melatonin 3 mg Tab Take 1 tablet (3 mg total) by mouth nightly. 30 tablet 0    metoprolol succinate (TOPROL-XL) 25 MG 24 hr tablet Take 1 tablet (25 mg total) by mouth.      mirtazapine (REMERON) 15 MG tablet Take 1 tablet (15 mg total) by mouth nightly. 90 tablet 3    mycophenolate (CELLCEPT) 250 mg capsule Take 2 capsules (500 mg total) by mouth two (2) times a day. 120 capsule 11    nortriptyline (PAMELOR) 10 MG capsule Take 1 capsule (10 mg total) by mouth nightly. 30 capsule 11    pen needle, diabetic (  PEN NEEDLE) 32 gauge x 5/32 (4 mm) Ndle Use with insulin twice a day 200 each 1    pen needle, diabetic (ULTICARE PEN NEEDLE) 32 gauge x 5/32 (4 mm) Ndle USE WITH INSULIN PEN 4 TIMES DAILY AS DIRECTED 100 each 1    rosuvastatin (CRESTOR) 5 MG tablet Take 1 tablet (5 mg total) by mouth daily. 90 tablet 3    sildenafiL, pulm.hypertension, (REVATIO) 20 mg tablet Take 2 tablets (40 mg total) by mouth Three (3) times a day. 180 tablet 6    tamsulosin (FLOMAX) 0.4 mg capsule Take 1 capsule (0.4 mg total) by mouth daily. 90 capsule 0     No current facility-administered medications for this visit.       Allergies:  No Known Allergies    Review of Systems:  As per HPI.    Physical Exam  Vitals:    06/06/22 1350   BP: 137/70   BP Site: L Arm   BP Position: Sitting   BP Cuff Size: Small   Pulse: 70   Resp: 18   Temp: 36.7 ??C (98.1 ??F)   TempSrc: Temporal   Weight: 63 kg (139 lb)   Height: 175.3 cm (5' 9.02)     GENERAL: The patient is a pleasant, male in no acute distress.   HEENT: Normocephalic and atraumatic.   NECK: Supple with trachea midline.   LYMPHATICS: No cervical or supraclavicular lymphadenopathy.   PULMONARY:  Relaxed respiratory effort on room air.   CARDIOVASCULAR: Regular rate   GASTROINTESTINAL: Soft, nontender and nondistended   GENITOURINARY: declined   SKIN: No signs of cyanosis or clubbing.   NEUROLOGICAL: Grossly intact.   PSYCH: Alert and oriented x 3.       Prostate Specific Antigen  PSA, Diagnostic [facility])  Collected: 02/06/17 1135   Result status: Final   Resulting lab: [facility]   Reference range: 0.00 - 4.00 ng/mL   Value: 1.49

## 2022-06-07 ENCOUNTER — Ambulatory Visit: Admit: 2022-06-07 | Discharge: 2022-06-08 | Payer: MEDICARE

## 2022-06-07 DIAGNOSIS — I2721 Secondary pulmonary arterial hypertension: Principal | ICD-10-CM

## 2022-06-07 DIAGNOSIS — R0600 Dyspnea, unspecified: Principal | ICD-10-CM

## 2022-06-07 DIAGNOSIS — Z944 Liver transplant status: Principal | ICD-10-CM

## 2022-06-07 DIAGNOSIS — K766 Portal hypertension: Principal | ICD-10-CM

## 2022-06-07 NOTE — Unmapped (Signed)
Thank you for visiting the [facility].    If you have any questions or concerns, please call     H. James Lenee Franze, MD    Laura Nowicki, RN  Sara Decker, RN    Pulmonary Hypertension nurse coordinators:    [phone]

## 2022-06-07 NOTE — Unmapped (Signed)
[facility]   Pulmonary Hypertension Program                                             H. Sherre Lain, MD--Director (Pulmonary)     Denice Bors. Tresa Res, MD--Associate Director (Pulmonary)    Freeman Caldron, MD (Cardiology)         Marva Panda, RN Nurse Coordinator     Claretha Cooper, RN Nurse Coordinator    [phone] office - [phone] fax      INITIAL HPI:    [name]  is a 72 y.o. male  patient referred by Jeffrey Booze, MD for work up and evaluation for pulmonary hypertension.    PAH Treatment History:  Sildenafil 40 mg tid since post liver txplt hospitalization.    Pt describes a history of recent liver txplt in 09/2016.  Post op was noted to have previously unrecognized PAH and RV failure, treated initially with inhaled iloprost while on vent support and then transitioned to sildenafil.  He had a long post op course requiring a stay in rehab of about 2 weeks prior to discharge.   His post op course was further complicated by AKI (on CRRT then had renal recovery), afib, bradycardia requiring pacemaker placement.  CTA negative for PE post op.      No LE edema currently.      He continues on sildenafil 40 tid and is receiving from [facility] pharmacy.      He is no longer on warfarin.    No syncope or near syncope.      Denies dyspnea, syncope or near syncope.      Had recent hip fx s/p surgery end of Sept 2019.  Currently undergoing rehab.      Has been having URI symptoms last two day.  Antibx was called in by his PCP.  No fevers.      INTERVAL HPI 11/19/19:    Patient reports doing well.  No changes since last visit aside from feeling a little more dyspneic with activities.  He has gained weight and thinks may be related to that.    No syncope or near syncope.  No LE edema noted.    Continues on sildenafil.      Patient refuses to get COVID vaccine.      INTERVAL HPI 07/31/20:    Patient continues to feel weakness and some dypsnea, but not changed compared to prior visit.    No LE edema noted.  No chest pain.     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    No syncope or near syncope.     INTERVAL HPI 12/01/20:    Patient here for follow up. Continues with same level of dyspnea he has had since liver tsplt.      No LE edema.     Denies syncope.      Continues on sildenafil for POPH.    Is overdue for echo--states he was not called to schedule after it was ordered at last visit.    INTERVAL HPI 04/06/21:    States he is doing well in terms of exertional capacity.  No syncope or near syncope.  No LE edema noted.    Asks for urology referral due to concern for BPH.    Continues  on sildenafil with no issues for POPH.      Overdue for follow up with hepatology.      INTERVAL HPI 08/06/21:    Patient here for follow up.      No new issues.  Denies syncope or LE edema.    Remains on sildenafil for POPH.    He has followed up with hepatology since last visit.      No changes in exertional capacity noted by patient.    INTERVAL HPI 12/07/21:    Here for follow up.  No dyspnea.    Denies syncope or near syncope.     No LE edema.    Echocardiogram has not been done yet.  Patient is not sure if he called to schedule or not.      Continues on sildenafil.      INTERVAL HPI 06/07/22:    Here for follow up visit.  Continues on sildenafil 40 mg tid.      No LE edema.  Denies dyspnea limiting his exertion.    No syncope or near syncope.      Still has not had echocardiogram.        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      The patient's current NYHA functional class is Class 2 - comfortable at rest but have symptoms with ordinary physcial activity.     Will get follow up echo as this is overdue.  He was not called to schedule.  Will need to provide him number to schedule again.  Explained importance of the test to follow up status of PH and prevent hepatic congestion.      Overall he appears stable with no signs of RV failure clinically.      Plan:          1) continue sildenafil 40 mg tid.      2) next visit in 6 months    3) echo in coming weeks-gave patient number for scheduling again    5) he declines flu shot and COVID vaccine      Subjective:      Past medical history is significant for:   Past Medical History:   Diagnosis Date    Atrial fibrillation (CMS-HCC)     Basal cell carcinoma     Cancer (CMS-HCC)     Cirrhosis (CMS-HCC)     Depression     Diabetes (CMS-HCC)     Headache     Hepatitis C 07/17/2012    History of transfusion     Liver disease     Low back pain 07/17/2012    Varices, esophageal (CMS-HCC)       Family History   Problem Relation Age of Onset    Hypertension Mother     Cancer Mother     Heart disease Mother     Heart disease Father     Cirrhosis Neg Hx     Liver cancer Neg Hx     Anemia Neg Hx     Diabetes Neg Hx     Kidney disease Neg Hx     Obesity Neg Hx     Thyroid disease Neg Hx     Osteoporosis Neg Hx     Coronary artery disease Neg Hx     Anesthesia problems Neg Hx     Basal cell carcinoma Neg Hx     Squamous  cell carcinoma Neg Hx       Social History     Socioeconomic History    Marital status: Widowed   Tobacco Use    Smoking status: Never    Smokeless tobacco: Never    Tobacco comments:     Smoked in high school for about 5 years.    Substance and Sexual Activity    Alcohol use: No    Drug use: No    Sexual activity: Yes     Partners: Female     Birth control/protection: None   Other Topics Concern    Exercise No    Living Situation No      Past Surgical History:   Procedure Laterality Date    ANKLE SURGERY      BACK SURGERY      BACK SURGERY      CARDIAC SURGERY      pacemaker    CHG US GUIDE, TISSUE ABLATION N/A 01/22/2016    Procedure: ULTRASOUND GUIDANCE FOR, AND MONITORING OF, PARENCHYMAL TISSUE ABLATION;  Surgeon: Particia Nearing, MD;  Location: MAIN OR [place];  Service: Transplant    IR EMBOLIZATION ORGAN ISCHEMIA, TUMORS, INFAR  06/16/2016    IR EMBOLIZATION ORGAN ISCHEMIA, TUMORS, INFAR 06/16/2016 Ammie Dalton, MD IMG VIR H&V [facility]    PR COLON CA SCRN NOT HI RSK IND N/A 02/27/2015    Procedure: COLOREC CNCR SCR;COLNSCPY NO;  Surgeon: Vonda Antigua, MD;  Location: GI PROCEDURES MEMORIAL [facility];  Service: Gastroenterology    PR ENDOSCOPIC ULTRASOUND EXAM N/A 02/27/2015    Procedure: UGI ENDO; W/ENDO ULTRASOUND EXAM INCLUDES ESOPHAGUS, STOMACH, &/OR DUODENUM/JEJUNUM;  Surgeon: Vonda Antigua, MD;  Location: GI PROCEDURES MEMORIAL [facility];  Service: Gastroenterology    PR ERCP BALLOON DILATE BILIARY/PANC DUCT/AMPULLA EA N/A 02/10/2017    Procedure: ERCP;WITH TRANS-ENDOSCOPIC BALLOON DILATION OF BILIARY/PANCREATIC DUCT(S) OR OF AMPULLA, INCLUDING SPHINCTERECTOMY, WHEN PERFOREMD,EACH DUCT (16109);  Surgeon: Mayford Knife, MD;  Location: GI PROCEDURES MEMORIAL [facility];  Service: Gastroenterology    PR ERCP REMOVE FOREIGN BODY/STENT BILIARY/PANC DUCT N/A 02/10/2017    Procedure: ENDOSCOPIC RETROGRADE CHOLANGIOPANCREATOGRAPHY (ERCP); W/ REMOVAL OF FOREIGN BODY/STENT FROM BILIARY/PANCREATIC DUCT(S);  Surgeon: Mayford Knife, MD;  Location: GI PROCEDURES MEMORIAL [facility];  Service: Gastroenterology    PR ERCP REMOVE FOREIGN BODY/STENT BILIARY/PANC DUCT N/A 06/29/2017    Procedure: ENDOSCOPIC RETROGRADE CHOLANGIOPANCREATOGRAPHY (ERCP); W/ REMOVAL OF FOREIGN BODY/STENT FROM BILIARY/PANCREATIC DUCT(S);  Surgeon: Vonda Antigua, MD;  Location: GI PROCEDURES [facility];  Service: Gastroenterology    PR ERCP STENT PLACEMENT BILIARY/PANCREATIC DUCT N/A 11/29/2016    Procedure: ENDOSCOPIC RETROGRADE CHOLANGIOPANCREATOGRAPHY (ERCP); WITH PLACEMENT OF ENDOSCOPIC STENT INTO BILIARY OR PANCREATIC DUCT;  Surgeon: Chriss Driver, MD;  Location: GI PROCEDURES [facility]IAL [facility];  Service: Gastroenterology    PR ERCP STENT PLACEMENT BILIARY/PANCREATIC DUCT N/A 04/26/2017    Procedure: ENDOSCOPIC RETROGRADE CHOLANGIOPANCREATOGRAPHY (ERCP); WITH PLACEMENT OF ENDOSCOPIC STENT INTO BILIARY OR PANCREATIC DUCT;  Surgeon: Mayford Knife, MD;  Location: GI PROCEDURES MEMORIAL [facility];  Service: Gastroenterology    PR ERCP,W/REMOVAL STONE,BIL/PANCR DUCTS N/A 11/29/2016    Procedure: ERCP; W/ENDOSCOPIC RETROGRADE REMOVAL OF CALCULUS/CALCULI FROM BILIARY &/OR PANCREATIC DUCTS;  Surgeon: Chriss Driver, MD;  Location: GI PROCEDURES MEMORIAL [facility];  Service: Gastroenterology    PR ERCP,W/REMOVAL STONE,BIL/PANCR DUCTS N/A 02/10/2017    Procedure: ERCP; W/ENDOSCOPIC RETROGRADE REMOVAL OF CALCULUS/CALCULI FROM BILIARY &/OR PANCREATIC DUCTS;  Surgeon: Mayford Knife, MD;  Location: GI PROCEDURES MEMORIAL [facility];  Service: Gastroenterology    PR ERCP,W/REMOVAL STONE,BIL/PANCR  DUCTS N/A 04/26/2017    Procedure: ERCP; W/ENDOSCOPIC RETROGRADE REMOVAL OF CALCULUS/CALCULI FROM BILIARY &/OR PANCREATIC DUCTS;  Surgeon: Mayford Knife, MD;  Location: GI PROCEDURES MEMORIAL [facility];  Service: Gastroenterology    PR ERCP,W/REMOVAL STONE,BIL/PANCR DUCTS N/A 06/29/2017    Procedure: ERCP; W/ENDOSCOPIC RETROGRADE REMOVAL OF CALCULUS/CALCULI FROM BILIARY &/OR PANCREATIC DUCTS;  Surgeon: Vonda Antigua, MD;  Location: GI PROCEDURES MEMORIAL [facility];  Service: Gastroenterology    PR ESOPHAGOSCP RIG TRANSORAL HYPOPHARYNX CRV ESOPH N/A 04/04/2018    Procedure: ESOPHAGOSCOPY, RIGID, TRANSORAL WITH DIVERTICULECTOMY OF HYPOPHARYNX OR CERVICAL ESOPHAGUS, WITH CRICOPHARYNGEAL MYOTOMY, INCLUDES USE OF TELESCOPE OR OPERATING MICROSCOPE AND REPAIR, WHEN PERFORMED;  Surgeon: Vista Deck, MD;  Location: MAIN OR [facility];  Service: ENT    PR INSER HEART TEMP PACER ONE CHMBR N/A 10/02/2016    Procedure: Tempoarary Pacemaker Insertion;  Surgeon: Meredith Leeds, MD;  Location: [facility];  Service: Cardiology    PR LAP,DIAGNOSTIC ABDOMEN N/A 01/22/2016    Procedure: Laparoscopy, Abdomen, Peritoneum, & Omentum, Diagnostic, W/Wo Collection Specimen(S) By Brushing Or Washing;  Surgeon: Particia Nearing, MD;  Location: MAIN OR [facility];  Service: Transplant    PR LARYNGOPLASTY MEDIALIZATION Neysa Bonito N/A 11/07/2018    Procedure: R31 LARYNGOPLASTY, MEDIALIZATION, UNILATERAL;  Surgeon: Vista Deck, MD;  Location: MAIN OR [facility];  Service: ENT    PR PLACE PERCUT GASTROSTOMY TUBE N/A 11/17/2016    Procedure: UGI ENDO; W/DIRECTED PLCMT PERQ GASTROSTOMY TUBE;  Surgeon: Cletis Athens, MD;  Location: GI PROCEDURES MEMORIAL [facility];  Service: Gastroenterology    PR TRACHEOSTOMY, PLANNED N/A 09/29/2016    Procedure: TRACHEOSTOMY PLANNED (SEPART PROC);  Surgeon: Katherina Mires, MD;  Location: MAIN OR [facility];  Service: Trauma    PR TRANSCATH INSERT OR REPLACE LEADLESS PM VENTR N/A 10/11/2016    Procedure: Pacemaker Implant/Replace Leadless;  Surgeon: Meredith Leeds, MD;  Location: [facility];  Service: Cardiology    PR TRANSPLANT LIVER,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    Procedure: Liver Allotransplantation; Orthotopic, Partial Or Whole, From Cadaver Or Living Donor, Any Age;  Surgeon: Doyce Loose, MD;  Location: MAIN OR [facility];  Service: Transplant    PR TRANSPLANT,PREP DONOR LIVER, WHOLE N/A 09/15/2016    Procedure: Rogelia Boga Std Prep Cad Donor Whole Liver Gft Prior Tnsplnt,Inc Chole,Diss/Rem Surr Tissu Wo Triseg/Lobe Splt;  Surgeon: Doyce Loose, MD;  Location: MAIN OR [facility];  Service: Transplant    PR UPPER GI ENDOSCOPY,BIOPSY N/A 07/17/2012    Procedure: UGI ENDOSCOPY; WITH BIOPSY, SINGLE OR MULTIPLE;  Surgeon: Alba Destine, MD;  Location: GI PROC[facility]URES MEMORIAL [facility];  Service: Gastroenterology    PR UPPER GI ENDOSCOPY,DIAGNOSIS N/A 02/04/2014    Procedure: UGI ENDO, INCLUDE ESOPHAGUS, STOMACH, & DUODENUM &/OR JEJUNUM; DX W/WO COLLECTION SPECIMN, BY BRUSH OR WASH;  Surgeon: Wilburt Finlay, MD;  Location: GI PROCEDURES MEMOR[facility];  Service: Gastroenterology    PR UPPER GI ENDOSCOPY,LIGAT VARIX N/A 11/05/2013    Procedure: UGI ENDO; Everlene Balls LIG ESOPH &/OR GASTRIC VARICES;  Surgeon: Wilburt Finlay, MD; Location: GI PROCEDURES MEMORIAL [facility];  Service: Gastroenterology        Objective:   Objective   Review of systems is significant for:     As stated in the HPI, remainder of 12 system review is negative.      No Known Allergies   Current Outpatient Medications   Medication Sig Dispense Refill    BASAGLAR KWIKPEN U-100 INSULIN 100 unit/mL (3 mL) injection pen Inject 0.14 mL (14 Units total) under  the skin at bedtime. 15 mL 4    BASAGLAR KWIKPEN U-100 INSULIN 100 unit/mL (3 mL) injection pen Inject 14 Units subcutaneously at bedtime 15 mL 4    blood-glucose meter (GLUCOSE MONITORING KIT) kit Use as instructed 1 each 0    cholecalciferol, vitamin D3, 125 mcg (5,000 unit) tablet Take 1 tablet (125 mcg total) by mouth daily.      cycloSPORINE modified (NEORAL) 100 MG capsule Take 2 capsules (200 mg total) by mouth daily AND 1 capsule (100 mg total) nightly. 270 capsule 3    cycloSPORINE modified (NEORAL) 25 MG capsule Take 3 capsules (75 mg total) by mouth nightly (with a 100mg  capsule). For a total dose of 200 mg in the morning , 175mg  every evening 270 capsule 3    DULoxetine (CYMBALTA) 30 MG capsule Take 2 capsules (60 mg total) by mouth nightly. 60 capsule 5    insulin glargine (BASAGLAR, LANTUS) 100 unit/mL (3 mL) injection pen Inject 0.14 mL (14 Units total) under the skin two (2) times a day. 15 mL 3    insulin glargine (BASAGLAR, LANTUS) 100 unit/mL (3 mL) injection pen Inject 0.14 mL (14 Units total) under the skin at bedtime. 15 mL 2    magnesium oxide-Mg AA chelate (MAGNESIUM, AMINO ACID CHELATE,) 133 mg Take 2 tablets by mouth Two (2) times a day. 120 tablet PRN    melatonin 3 mg Tab Take 1 tablet (3 mg total) by mouth nightly. 30 tablet 0    metoprolol succinate (TOPROL-XL) 25 MG 24 hr tablet Take 1 tablet (25 mg total) by mouth.      mycophenolate (CELLCEPT) 250 mg capsule Take 2 capsules (500 mg total) by mouth two (2) times a day. 120 capsule 11    nortriptyline (PAMELOR) 10 MG capsule Take 1 capsule (10 mg total) by mouth nightly. 30 capsule 11    pen needle, diabetic (PEN NEEDLE) 32 gauge x 5/32 (4 mm) Ndle Use with insulin twice a day 200 each 1    rosuvastatin (CRESTOR) 5 MG tablet Take 1 tablet (5 mg total) by mouth daily. 90 tablet 3    sildenafiL, pulm.hypertension, (REVATIO) 20 mg tablet Take 2 tablets (40 mg total) by mouth Three (3) times a day. 180 tablet 6    tamsulosin (FLOMAX) 0.4 mg capsule Take 1 capsule (0.4 mg total) by mouth daily. 90 capsule 0    mirtazapine (REMERON) 15 MG tablet Take 1 tablet (15 mg total) by mouth nightly. 90 tablet 3    pen needle, diabetic (ULTICARE PEN NEEDLE) 32 gauge x 5/32 (4 mm) Ndle USE WITH INSULIN PEN 4 TIMES DAILY AS DIRECTED 100 each 1     No current facility-administered medications for this visit.        Physical Exam:    weight is 65.8 kg (145 lb). His blood pressure is 175/80 and his pulse is 62. His oxygen saturation is 98%.   General Appearance:    Alert, cooperative, no distress, appears stated age   Head:    Normocephalic, without obvious abnormality, atraumatic   Eyes:    PERRL, conjunctiva clear, EOM's intact    Oropharynx:   No lesions   Neck:   Supple, trachea midline, no adenopathy;    No JVD   Lungs:     Good breath sounds b/l, no crackles or wheezes, respirations unlabored   Chest Wall:    No tenderness or deformity    Heart:    Regular rate and rhythm, S1  and S2 normal, no murmur, rub   or gallop   Abdomen:     Soft, non-tender, normal bowel sounds, no masses, no organomegaly.  + G tube in place   Extremities:   Extremities normal, no cyanosis, clubbing, or edema   Pulses:   2+ and symmetric all extremities   Skin:   No rashes or lesions   Lymph nodes:   No cervical or supraclavicular adenopathy   Neurologic:   No focal neurological deficits       Diagnostic Review:    Echo 04/21/17:    Left Ventricle The left ventricle is normal in size with normal wall thickness.   The left ventricular systolic function is normal.   The ejection fraction is visually estimated at 65 to 70%.   There is impaired left ventricular relaxation with normal left ventricular filling pressure consistent with grade I diastolic dysfunction.   Mitral Valve The mitral valve leaflets are normal, with normal leaflet mobility.   There is no significant mitral regurgitation by color and continuous wave Doppler imaging.   Left Atrium The left atrium is dilated.   Aortic Valve The aortic valve is poorly visualized with probably normal excursion.   There is no significant aortic regurgitation by color and continuous wave Doppler imaging.  There is no evidence of a significant transvalvular gradient.   - Peak transvalvular velocity: 1 m/sec   Ascending Aorta The aorta is normal in size in the visualized segments.   Pulmonary Artery The pulmonary artery is not well visualized.   Pulmonic Valve The pulmonic valve is poorly visualized, but probably normal.   There is no significant pulmonic regurgitation by color and continuous wave Doppler imaging.   There is no evidence of a significant transvalvular gradient.   - Peak transvalvular velocity: 0.9 m/sec   Right Ventricle The right ventricle is normal in size with normal wall thickness.   The right ventricular systolic function is normal.   Tricuspid Valve The tricuspid valve leaflets are normal, with normal leaflet mobility.   There is trivial tricuspid regurgitation by color and continuous wave Doppler imaging.   - The Doppler signal is suboptimal and pulmonary artery systolic pressure cannot be accurately estimated.   Right Atrium The right atrium is normal in size.   IVC/SVC The IVC diameter is <=21 mm with >50% decrease in size with inspiration suggesting normal right atrial pressure (0-5 mm Hg).   Pericardium There is no evidence of a significant pericardial effusion.         V/Q scan 01/11/17:    Impression     Normal ventilation and perfusion scan.         CT scan PE protocol performed on 09/2016 post op revealed:    Impression     No CT evidence for acute pulmonary embolism, as clinically questioned.    Moderate bilateral pleural effusions similar to prior CT dated 01/30/2016 with slightly increased atelectasis of the adjacent lung parenchyma in the bilateral lower lobes, left greater than right.    Addendum (Dr.Altun dictating 09/27/2016 08:35 AM) Focal contour bulging of the liver adjacent to the diaphragm and right kidney is noted which is not well evaluated due to the presence of streak artifacts.          Pulmonary Function Test performed on 11/24/15 show:            Consistent with  normal spirometry and normal DLCO    6 Minute Walk Test performed on 01/06/17:  Echo performed on 09/27/16:    Echocardiographic Findings     Left Ventricle The left ventricle is normal in size with normal wall thickness.   The left ventricular systolic function is normal.   The ejection fraction is visually estimated at 60 to 65%.   Left ventricular diastolic function cannot be accurately assessed.      Left Atrium The left atrium is normal in size.      Right Ventricle The right ventricle is moderately dilated.   The right ventricular systolic function is decreased.      Tricuspid Valve The tricuspid valve is not well visualized.   There is mild tricuspid regurgitation by color and continuous wave Doppler imaging.   The regurgitation jet is directed toward the septum.   The pulmonary artery systolic pressure is severely elevated.   - Peak tricuspid regurgitant velocity: 4.6 m/sec  - Estimated pulmonary artery systolic pressure: 80 + RA pressure mmHg      Right Atrium The right atrium is poorly visualized but appears dilated      IVC/SVC Mechanical ventilation precludes the ability to accurately assess right atrial pressure.      Pericardium There is a small anterior pericardial effusion.          [facility] Catheterization performed on date 09/22/16:     Wedge pressure initially looks to be 15-16 but with further balloon deflation this clearly decreases further to 8 mmHg with very good quality tracing. At that time mPAP 50, CVP 9, CO by CCO measurements 4.1/CI 2.2, PVR approximating 10 WU.

## 2022-06-09 ENCOUNTER — Ambulatory Visit
Admit: 2022-06-09 | Payer: MEDICARE | Attending: Rehabilitative and Restorative Service Providers" | Primary: Rehabilitative and Restorative Service Providers"

## 2022-06-10 MED FILL — SILDENAFIL (PULMONARY HYPERTENSION) 20 MG TABLET: ORAL | 30 days supply | Qty: 180 | Fill #5

## 2022-06-22 MED ORDER — ALENDRONATE 70 MG TABLET
ORAL_TABLET | 3 refills | 0 days
Start: 2022-06-22 — End: ?

## 2022-07-01 ENCOUNTER — Ambulatory Visit: Admit: 2022-07-01 | Discharge: 2022-07-02 | Payer: MEDICARE

## 2022-07-01 MED FILL — ALENDRONATE 70 MG TABLET: 84 days supply | Qty: 12 | Fill #0

## 2022-07-01 NOTE — Unmapped (Signed)
Jeffrey Ward,   It was a pleasure to see you in clinic today. We discussed that we do not advise that you use this medication Trimix for your erectile dysfunction as it may interact with your pulmonary hypertension medication. However, since you have a pulmonary hypertension specialist Dr. Ala Dach, the final recommendation should come from him.

## 2022-07-01 NOTE — Unmapped (Signed)
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  Of note patient is currently being managed by pulmonary hypertension specialist Dr. Ala Dach and was recently seen in 05/2022 at which time he was continued on his current dose of sildenafil 40 mg 3 times daily. Trimix is a combination of prostaglandin, alpha-blocker, vasodilator and thus should not be used in combination with sildenafil.  - We discussed with patient that we do not recommend use of Trimix while patient is on sildenafil  - We discussed that since patient has a PAH specialist Dr. Ala Dach, final recommendations about Trimix should come from him    Return if symptoms worsen or fail to improve.    Patient seen by and discussed with Dr. Willa Rough.      History of Present Illness:  [name] is a 72 y.o. male with a history of hyperlipidemia, atrial fibrillation, sinus node dysfunction s/p Micra (2018), cirrhosis s/p OLT (09/2016) with prolonged post op course complicated by [facility] and RV failure that was initially treated with inhaled iloprost while on vent support and then transitioned to sildenafil. He has been referred by Urology for clearance to get Trimix injections for erectile dysfunction.    Patient is currently being managed by pulmonary hypertension specialist Dr. Ala Dach and was recently seen in March 2024 at which time he was continued on his current dose of sildenafil 40 mg 3 times a day.  Patient reports that he has been seen by urologist for his erectile dysfunction and he was suggested to use Trimix for further management.  Today, patient reports chronic, intermittent postural dizziness and shortness of breath; denies chest pain, lower extremity edema, syncope, orthopnea, PND.  He has other cardiac complaints today.      Past Cardiac History:    Cath / PCI:  No known    CV Surgery:  No known    EP Procedures and Devices:  Leadless pacemaker -Micra- 10/11/2016    Non-Invasive Evaluation(s):  Echocardiogram:  12/02/2019   1. Technically difficult study due to chest wall/lung interference.    2. The left ventricular systolic function is normal, LVEF is visually estimated at 55-60%.    3. The aortic valve is probably trileaflet with mildly thickened leaflets with normal excursion.    4. The right ventricle is normal in size, with normal systolic function.     ECG:  07/01/22 -sinus rhythm    Past Medical History:   Diagnosis Date    Atrial fibrillation (CMS-HCC)     Basal cell carcinoma     Cancer (CMS-HCC)     Cirrhosis (CMS-HCC)     Depression     Diabetes (CMS-HCC)     Headache     Hepatitis C 07/17/2012    History of transfusion     Liver disease     Low back pain 07/17/2012    Varices, esophageal (CMS-HCC)        Past Surgical History:  Past Surgical History:   Procedure Laterality Date    ANKLE SURGERY      BACK SURGERY      BACK SURGERY      CARDIAC  SURGERY      pacemaker    CHG US GUIDE, TISSUE ABLATION N/A 01/22/2016    Procedure: ULTRASOUND GUIDANCE FOR, AND MONITORING OF, PARENCHYMAL TISSUE ABLATION;  Surgeon: Particia Nearing, MD;  Location: MAIN OR [facility];  Service: Transplant    IR EMBOLIZATION ORGAN ISCHEMIA, TUMORS, INFAR  06/16/2016    IR EMBOLIZATION ORGAN ISCHEMIA, TUMORS, INFAR 06/16/2016 Ammie Dalton, MD IMG VIR H&V [facility]    PR COLON CA SCRN NOT HI RSK IND N/A 02/27/2015    Procedure: COLOREC CNCR SCR;COLNSCPY NO;  Surgeon: Vonda Antigua, MD;  Location: GI PROCEDURES MEMORIAL [facility];  Service: Gastroenterology    PR ENDOSCOPIC ULTRASOUND EXAM N/A 02/27/2015    Procedure: UGI ENDO; W/ENDO ULTRASOUND EXAM INCLUDES ESOPHAGUS, STOMACH, &/OR DUODENUM/JEJUNUM;  Surgeon: Vonda Antigua, MD;  Location: GI PROCEDURES MEMORIAL [facility]; Service: Gastroenterology    PR ERCP BALLOON DILATE BILIARY/PANC DUCT/AMPULLA EA N/A 02/10/2017    Procedure: ERCP;WITH TRANS-ENDOSCOPIC BALLOON DILATION OF BILIARY/PANCREATIC DUCT(S) OR OF AMPULLA, INCLUDING SPHINCTERECTOMY, WHEN PERFOREMD,EACH DUCT ([address]);  Surgeon: Mayford Knife, MD;  Location: GI PROCEDURES [facility];  Service: Gastroenterology    PR ERCP REMOVE FOREIGN BODY/STENT BILIARY/PANC DUCT N/A 02/10/2017    Procedure: ENDOSCOPIC RETROGRADE CHOLANGIOPANCREATOGRAPHY (ERCP); W/ REMOVAL OF FOREIGN BODY/STENT FROM BILIARY/PANCREATIC DUCT(S);  Surgeon: Mayford Knife, MD;  Location: GI PROCEDURES [facility]OR[facility];  Service: Gastroenterology    PR ERCP REMOVE FOREIGN BODY/STENT BILIARY/PANC DUCT N/A 06/29/2017    Procedure: ENDOSCOPIC RETROGRADE CHOLANGIOPANCREATOGRAPHY (ERCP); W/ REMOVAL OF FOREIGN BODY/STENT FROM BILIARY/PANCREATIC DUCT(S);  Surgeon: Vonda Antigua, MD;  Location: GI PROCEDURES [facility];  Service: Gastroenterology    PR ERCP STENT PLACEMENT BILIARY/PANCREATIC DUCT N/A 11/29/2016    Procedure: ENDOSCOPIC RETROGRADE CHOLANGIOPANCREATOGRAPHY (ERCP); WITH PLACEMENT OF ENDOSCOPIC STENT INTO BILIARY OR PANCREATIC DUCT;  Surgeon: Chriss Driver, MD;  Location: GI PROCEDURES [facility]OR[facility];  Service: Gastroenterology    PR ERCP STENT PLACEMENT BILIARY/PANCREATIC DUCT N/A 04/26/2017    Procedure: ENDOSCOPIC RETROGRADE CHOLANGIOPANCREATOGRAPHY (ERCP); WITH PLACEMENT OF ENDOSCOPIC STENT INTO BILIARY OR PANCREATIC DUCT;  Surgeon: Mayford Knife, MD;  Location: GI PROCEDURES [facility]OR[facility];  Service: Gastroenterology    PR ERCP,W/REMOVAL STONE,BIL/PANCR DUCTS N/A 11/29/2016    Procedure: ERCP; W/ENDOSCOPIC RETROGRADE REMOVAL OF CALCULUS/CALCULI FROM BILIARY &/OR PANCREATIC DUCTS;  Surgeon: Chriss Driver, MD;  Location: GI PROCEDURES [facility];  Service: Gastroenterology    PR ERCP,W/REMOVAL STONE,BIL/PANCR DUCTS N/A 02/10/2017    Procedure: ERCP; W/ENDOSCOPIC RETROGRADE REMOVAL OF CALCULUS/CALCULI FROM BILIARY &/OR PANCREATIC DUCTS;  Surgeon: Mayford Knife, MD;  Location: GI PROCEDURES [facility];  Service: Gastroenterology    PR ERCP,W/REMOVAL STONE,BIL/PANCR DUCTS N/A 04/26/2017    Procedure: ERCP; W/ENDOSCOPIC RETROGRADE REMOVAL OF CALCULUS/CALCULI FROM BILIARY &/OR PANCREATIC DUCTS;  Surgeon: Mayford Knife, MD;  Location: GI PROCEDURES MEMOR[facility];  Service: Gastroenterology    PR ERCP,W/REMOVAL STONE,BIL/PANCR DUCTS N/A 06/29/2017    Procedure: ERCP; W/ENDOSCOPIC RETROGRADE REMOVAL OF CALCULUS/CALCULI FROM BILIARY &/OR PANCREATIC DUCTS;  Surgeon: Vonda Antigua, MD;  Location: GI PROCEDURES MEMOR[facility];  Service: Gastroenterology    PR ESOPHAGOSCP RIG TRANSORAL HYPOPHARYNX CRV ESOPH N/A 04/04/2018    Procedure: ESOPHAGOSCOPY, RIGID, TRANSORAL WITH DIVERTICULECTOMY OF HYPOPHARYNX OR CERVICAL ESOPHAGUS, WITH CRICOPHARYNGEAL MYOTOMY, INCLUDES USE OF TELESCOPE OR OPERATING MICROSCOPE AND REPAIR, WHEN PERFORMED;  Surgeon: Vista Deck, MD;  Location: MAIN OR [facility];  Service: ENT    PR INSER HEART TEMP PACER ONE CHMBR N/A 10/02/2016    Procedure: Tempoarary Pacemaker Insertion;  Surgeon: Meredith Leeds,  MD;  Location: [facility];  Service: Cardiology    PR LAP,DIAGNOSTIC ABDOMEN N/A 01/22/2016    Procedure: Laparoscopy, Abdomen, Peritoneum, & Omentum, Diagnostic, W/Wo Collection Specimen(S) By Brushing Or Washing;  Surgeon: Particia Nearing, MD;  Location: MAIN OR [facility];  Service: Transplant    PR LARYNGOPLASTY MEDIALIZATION Neysa Bonito N/A 11/07/2018    Procedure: R31 LARYNGOPLASTY, MEDIALIZATION, UNILATERAL;  Surgeon: Vista Deck, MD;  Location: MAIN OR [facility];  Service: ENT    PR PLACE PERCUT GASTROSTOMY TUBE N/A 11/17/2016    Procedure: UGI ENDO; W/DIRECTED PLCMT PERQ GASTROSTOMY TUBE;  Surgeon: Cletis Athens, MD;  Location: GI PROCEDURES [facility]OR[facility];  Service: Gastroenterology    PR TRACHEOSTOMY, PLANNED N/A 09/29/2016    Procedure: TRACHEOSTOMY PLANNED (SEPART PROC);  Surgeon: Katherina Mires, MD;  Location: MAIN OR [facility];  Service: Trauma    PR TRANSCATH INSERT OR REPLACE LEADLESS PM VENTR N/A 10/11/2016    Procedure: Pacemaker Implant/Replace Leadless;  Surgeon: Meredith Leeds, MD;  Location: [facility];  Service: Cardiology    PR TRANSPLANT LIVER,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    Procedure: Liver Allotransplantation; Orthotopic, Partial Or Whole, From Cadaver Or Living Donor, Any Age;  Surgeon: Doyce Loose, MD;  Location: MAIN OR [facility];  Service: Transplant    PR TRANSPLANT,PREP DONOR LIVER, WHOLE N/A 09/15/2016    Procedure: Rogelia Boga Std Prep Cad Donor Whole Liver Gft Prior Tnsplnt,Inc Chole,Diss/Rem Surr Tissu Wo Triseg/Lobe Splt;  Surgeon: Doyce Loose, MD;  Location: MAIN OR [facility];  Service: Transplant    PR UPPER GI ENDOSCOPY,BIOPSY N/A 07/17/2012    Procedure: UGI ENDOSCOPY; WITH BIOPSY, SINGLE OR MULTIPLE;  Surgeon: Alba Destine, MD;  Location: GI PROCEDURES [facility]OR[facility];  Service: Gastroenterology    PR UPPER GI ENDOSCOPY,DIAGNOSIS N/A 02/04/2014    Procedure: UGI ENDO, INCLUDE ESOPHAGUS, STOMACH, & DUODENUM &/OR JEJUNUM; DX W/WO COLLECTION SPECIMN, BY BRUSH OR WASH;  Surgeon: Wilburt Finlay, MD;  Location: GI PROCEDURES [facility]OR[facility];  Service: Gastroenterology    PR UPPER GI ENDOSCOPY,LIGAT VARIX N/A 11/05/2013    Procedure: UGI ENDO; Everlene Balls LIG ESOPH &/OR GASTRIC VARICES;  Surgeon: Wilburt Finlay, MD;  Location: GI PROCEDURES [facility]OR[facility];  Service: Gastroenterology       Medications:  Current Outpatient Medications   Medication Sig Dispense Refill    alendronate (FOSAMAX) 70 MG tablet Take 1 tablet every Sunday on empty stomach with a full glass of water. Do not lie down or eat/drink anything else for the next 30 min. 12 tablet 3    BASAGLAR KWIKPEN U-100 INSULIN 100 unit/mL (3 mL) injection pen Inject 0.14 mL (14 Units total) under the skin at bedtime. 15 mL 4    BASAGLAR KWIKPEN U-100 INSULIN 100 unit/mL (3 mL) injection pen Inject 14 Units subcutaneously at bedtime 15 mL 4    blood-glucose meter (GLUCOSE MONITORING KIT) kit Use as instructed 1 each 0    cholecalciferol, vitamin D3, 125 mcg (5,000 unit) tablet Take 1 tablet (125 mcg total) by mouth daily.      cycloSPORINE modified (NEORAL) 100 MG capsule Take 2 capsules (200 mg total) by mouth daily AND 1 capsule (100 mg total) nightly. 270 capsule 3    cycloSPORINE modified (NEORAL) 25 MG capsule Take 3 capsules (75 mg total) by mouth nightly (with a 100mg  capsule). For a total dose of 200 mg in the morning , 175mg  every evening 270 capsule 3    DULoxetine (CYMBALTA) 30 MG capsule Take 2 capsules (60 mg total) by  mouth nightly. 60 capsule 5    insulin glargine (BASAGLAR, LANTUS) 100 unit/mL (3 mL) injection pen Inject 0.14 mL (14 Units total) under the skin two (2) times a day. 15 mL 3    insulin glargine (BASAGLAR, LANTUS) 100 unit/mL (3 mL) injection pen Inject 0.14 mL (14 Units total) under the skin at bedtime. 15 mL 2    magnesium oxide-Mg AA chelate (MAGNESIUM, AMINO ACID CHELATE,) 133 mg Take 2 tablets by mouth Two (2) times a day. 120 tablet PRN    melatonin 3 mg Tab Take 1 tablet (3 mg total) by mouth nightly. 30 tablet 0    metoprolol succinate (TOPROL-XL) 25 MG 24 hr tablet Take 1 tablet (25 mg total) by mouth.      mirtazapine (REMERON) 15 MG tablet Take 1 tablet (15 mg total) by mouth nightly. 90 tablet 3    mycophenolate (CELLCEPT) 250 mg capsule Take 2 capsules (500 mg total) by mouth two (2) times a day. 120 capsule 11    nortriptyline (PAMELOR) 10 MG capsule Take 1 capsule (10 mg total) by mouth nightly. 30 capsule 11    pen needle, diabetic (PEN NEEDLE) 32 gauge x 5/32 (4 mm) Ndle Use with insulin twice a day 200 each 1    pen needle, diabetic (ULTICARE PEN NEEDLE) 32 gauge x 5/32 (4 mm) Ndle USE WITH INSULIN PEN 4 TIMES DAILY AS DIRECTED 100 each 1    rosuvastatin (CRESTOR) 5 MG tablet Take 1 tablet (5 mg total) by mouth daily. 90 tablet 3 sildenafiL, pulm.hypertension, (REVATIO) 20 mg tablet Take 2 tablets (40 mg total) by mouth Three (3) times a day. 180 tablet 6    tamsulosin (FLOMAX) 0.4 mg capsule Take 1 capsule (0.4 mg total) by mouth daily. 90 capsule 0     No current facility-administered medications for this visit.       Allergies:  No Known Allergies    Social History:  Social History     Socioeconomic History    Marital status: Widowed   Tobacco Use    Smoking status: Never    Smokeless tobacco: Never    Tobacco comments:     Smoked in high school for about 5 years.    Substance and Sexual Activity    Alcohol use: No    Drug use: No    Sexual activity: Yes     Partners: Female     Birth control/protection: None   Other Topics Concern    Exercise No    Living Situation No       Family History:  Family History   Problem Relation Age of Onset    Hypertension Mother     Cancer Mother     Heart disease Mother     Heart disease Father     Cirrhosis Neg Hx     Liver cancer Neg Hx     Anemia Neg Hx     Diabetes Neg Hx     Kidney disease Neg Hx     Obesity Neg Hx     Thyroid disease Neg Hx     Osteoporosis Neg Hx     Coronary artery disease Neg Hx     Anesthesia problems Neg Hx     Basal cell carcinoma Neg Hx     Squamous cell carcinoma Neg Hx        Review of Systems:  A 12-system review of systems was performed and was negative except as noted in the HPI.  Physical Exam:  VITAL SIGNS:   Vitals:    07/01/22 1433   BP: 138/68   BP Site: L Arm   BP Position: Sitting   BP Cuff Size: Medium   Pulse: 66   SpO2: 99%   Weight: 64.9 kg (143 lb)   Height: 175.3 cm (5' 9)     Body mass index is 21.12 kg/m??.  GENERAL: Frail, elderly man; appears comfortable sitting in chair. NAD.  HEENT: [place]/AT, normal sclera, MMM.  NECK: Supple w/ normal ROM. No JVD  RESPIRATORY: CTA bilaterally without wheezes or crackles.  Normal WOB.  CARDIOVASCULAR: RRR without m/r/g.  ABDOMEN: Soft, NT/ND   EXTREMITIES:  No LE edema.     Pertinent Test Results:  Imaging  N/A    Lab Review   Lab Results   Component Value Date    PRO-BNP 111.0 12/07/2021    PRO-BNP 313.0 (H) 04/06/2021    PRO-BNP 359.0 (H) 12/01/2020    Triglycerides 101 12/26/2016    HDL 70 (H) 12/26/2016    Non-HDL Cholesterol 142 12/26/2016    LDL Calculated 122 (H) 12/26/2016    Creatinine Whole Blood, POC 1.0 09/25/2015    Creatinine 2.21 (H) 12/07/2021    BUN 31 (H) 12/07/2021    BUN 34 (H) 09/25/2018    Potassium 5.1 (H) 12/07/2021    Potassium, Bld 4.1 09/26/2016    Magnesium 1.6 05/17/2021    WBC 4.8 05/17/2021    WBC 6.9 09/25/2018    WBC 2.4 (L) 01/09/2014    HGB 12.1 (L) 05/17/2021    Hemoglobin 6.2 (L) 09/26/2016    HCT 35.4 (L) 05/17/2021    HCT 35.4 (L) 01/09/2014    Platelet 117 (L) 05/17/2021    Platelet 35 (L) 01/09/2014    INR 1.01 11/05/2018    INR 1.04 08/02/2018    INR 1.3 01/09/2014    INR 1.4 10/10/2013     ---------------------------------  Concha Se, MD  [place] [facility]      This note has been dictated using Dragon Medical voice recognition software and as such may contain typographical errors not appreciated during proofreading.

## 2022-07-03 NOTE — Unmapped (Signed)
I saw and evaluated the patient, participating in the key portions of the service and reviewing pertinent diagnostic studies and records. I reviewed the resident’s note and agree with the findings and plan.  - Jeffrie Lofstrom H Jaquila Santelli, MD, FACC

## 2022-07-08 NOTE — Unmapped (Signed)
The [facility] has made a second and final attempt to reach this patient to refill the following medication:cyclosporine 100 and 25 mg and mycophenolate 250 mg.      We have left voicemails on the following phone numbers: [phone] .    Dates contacted: 06/24/22 and 06/29/22  Last scheduled delivery: 06/03/22    The patient may be at risk of non-compliance with this medication. The patient should call the [facility] at [phone]  Option 4, then Option 4: Infectious Disease, Transplant to refill medication.    Quintella Reichert   [facility] Specialty Technician

## 2022-07-28 DIAGNOSIS — E119 Type 2 diabetes mellitus without complications: Principal | ICD-10-CM

## 2022-08-01 DIAGNOSIS — Z944 Liver transplant status: Principal | ICD-10-CM

## 2022-08-01 DIAGNOSIS — Z794 Long term (current) use of insulin: Principal | ICD-10-CM

## 2022-08-01 DIAGNOSIS — E559 Vitamin D deficiency, unspecified: Principal | ICD-10-CM

## 2022-08-01 DIAGNOSIS — E119 Type 2 diabetes mellitus without complications: Principal | ICD-10-CM

## 2022-08-01 DIAGNOSIS — Z79899 Other long term (current) drug therapy: Principal | ICD-10-CM

## 2022-08-01 NOTE — Unmapped (Signed)
Lab orders placed for the pt

## 2022-08-01 NOTE — Unmapped (Signed)
Lab order placed.

## 2022-08-01 NOTE — Unmapped (Addendum)
A call was placed to the pt but he was not available. A message was left on voicemail informing the pt that he was due to get labs again. Requested the pt go in the next week to get blood work drawn. Also requested a return call to confirm receipt of this message.     Reviewed lab orders and pt had no standing orders. Entered future lab orders for pt to use.

## 2022-08-04 MED FILL — LANTUS SOLOSTAR U-100 INSULIN 100 UNIT/ML (3 ML) SUBCUTANEOUS PEN: SUBCUTANEOUS | 107 days supply | Qty: 15 | Fill #0

## 2022-08-10 NOTE — Unmapped (Signed)
A call was placed to the pt but he was not available. A message was left on voicemail informing the pt that he was due to get labs again. Requested the pt go in the next week to get blood work drawn. Also requested a return call to confirm receipt of this message.

## 2022-08-12 NOTE — Unmapped (Signed)
Returned a phone call to the pt. Pt was informed that he was due for some blood work to be drawn. Pt was agreeable to going to [facility] next week to get the labs drawn. Informed pt that lab orders are already available for his use. Pt verbalized his understanding.

## 2022-08-24 ENCOUNTER — Ambulatory Visit: Admit: 2022-08-24 | Discharge: 2022-08-25 | Payer: MEDICARE

## 2022-08-24 LAB — CBC W/ AUTO DIFF
BASOPHILS ABSOLUTE COUNT: 0 10*9/L (ref 0.0–0.1)
BASOPHILS RELATIVE PERCENT: 0.7 %
EOSINOPHILS ABSOLUTE COUNT: 0.1 10*9/L (ref 0.0–0.5)
EOSINOPHILS RELATIVE PERCENT: 1.1 %
HEMATOCRIT: 38.4 % — ABNORMAL LOW (ref 39.0–48.0)
HEMOGLOBIN: 13.4 g/dL (ref 12.9–16.5)
LYMPHOCYTES ABSOLUTE COUNT: 1.2 10*9/L (ref 1.1–3.6)
LYMPHOCYTES RELATIVE PERCENT: 19.8 %
MEAN CORPUSCULAR HEMOGLOBIN CONC: 34.9 g/dL (ref 32.0–36.0)
MEAN CORPUSCULAR HEMOGLOBIN: 32.2 pg (ref 25.9–32.4)
MEAN CORPUSCULAR VOLUME: 92.5 fL (ref 77.6–95.7)
MEAN PLATELET VOLUME: 8.7 fL (ref 6.8–10.7)
MONOCYTES ABSOLUTE COUNT: 0.4 10*9/L (ref 0.3–0.8)
MONOCYTES RELATIVE PERCENT: 7 %
NEUTROPHILS ABSOLUTE COUNT: 4.2 10*9/L (ref 1.8–7.8)
NEUTROPHILS RELATIVE PERCENT: 71.4 %
NUCLEATED RED BLOOD CELLS: 0 /100{WBCs} (ref <=4)
PLATELET COUNT: 121 10*9/L — ABNORMAL LOW (ref 150–450)
RED BLOOD CELL COUNT: 4.15 10*12/L — ABNORMAL LOW (ref 4.26–5.60)
RED CELL DISTRIBUTION WIDTH: 12.8 % (ref 12.2–15.2)
WBC ADJUSTED: 5.9 10*9/L (ref 3.6–11.2)

## 2022-08-24 LAB — COMPREHENSIVE METABOLIC PANEL
ALBUMIN: 3.8 g/dL (ref 3.4–5.0)
ALKALINE PHOSPHATASE: 102 U/L (ref 46–116)
ALT (SGPT): 12 U/L (ref 10–49)
ANION GAP: 7 mmol/L (ref 5–14)
AST (SGOT): 11 U/L (ref <=34)
BILIRUBIN TOTAL: 0.4 mg/dL (ref 0.3–1.2)
BLOOD UREA NITROGEN: 33 mg/dL — ABNORMAL HIGH (ref 9–23)
BUN / CREAT RATIO: 19
CALCIUM: 9.7 mg/dL (ref 8.7–10.4)
CHLORIDE: 110 mmol/L — ABNORMAL HIGH (ref 98–107)
CO2: 21.5 mmol/L (ref 20.0–31.0)
CREATININE: 1.78 mg/dL — ABNORMAL HIGH
EGFR CKD-EPI (2021) MALE: 40 mL/min/{1.73_m2} — ABNORMAL LOW (ref >=60)
GLUCOSE RANDOM: 325 mg/dL — ABNORMAL HIGH (ref 70–179)
POTASSIUM: 4.8 mmol/L (ref 3.4–4.8)
PROTEIN TOTAL: 6.8 g/dL (ref 5.7–8.2)
SODIUM: 138 mmol/L (ref 135–145)

## 2022-08-24 LAB — BILIRUBIN, DIRECT: BILIRUBIN DIRECT: 0.1 mg/dL (ref 0.00–0.30)

## 2022-08-24 LAB — MAGNESIUM: MAGNESIUM: 1.9 mg/dL (ref 1.6–2.6)

## 2022-08-24 LAB — PHOSPHORUS: PHOSPHORUS: 2.9 mg/dL (ref 2.4–5.1)

## 2022-08-24 LAB — HEMOGLOBIN A1C
ESTIMATED AVERAGE GLUCOSE: 154 mg/dL
HEMOGLOBIN A1C: 7 % — ABNORMAL HIGH (ref 4.8–5.6)

## 2022-08-24 LAB — GAMMA GT: GAMMA GLUTAMYL TRANSFERASE: 46 U/L

## 2022-08-25 LAB — VITAMIN D 25 HYDROXY: VITAMIN D, TOTAL (25OH): 39.3 ng/mL (ref 20.0–80.0)

## 2022-08-25 LAB — CYCLOSPORINE, TROUGH: CYCLOSPORINE, TROUGH: <10 ng/mL — ABNORMAL LOW (ref 100–400)

## 2022-08-25 NOTE — Unmapped (Signed)
A call was placed to the pt but he was not available. A voicemail message was left requesting a return call to discuss his lab results, including the cyclosporine level of < 10.    A message was sent to [facility] ANP with an update provided regarding pt's lab results. Advised TNC will request the pt repeat his labs again in 2-4 weeks when he returns the TNC's call.

## 2022-09-21 NOTE — Unmapped (Signed)
TRF

## 2022-10-07 DIAGNOSIS — Z944 Liver transplant status: Principal | ICD-10-CM

## 2022-10-07 MED ORDER — MG-PLUS-PROTEIN 133 MG TABLET
ORAL_TABLET | Freq: Two times a day (BID) | ORAL | PRN refills | 60 days | Status: CP
Start: 2022-10-07 — End: ?

## 2022-10-07 MED ORDER — PEG 3350-ELECTROLYTES 236 GRAM-22.74 GRAM-6.74 GRAM-5.86 GRAM SOLUTION
0 refills | 0 days | Status: CP
Start: 2022-10-07 — End: ?

## 2022-10-07 NOTE — Unmapped (Signed)
A call was placed to the pt. He reported that he has been having diarrhea for the past four weeks and was informed by his PCP to call the TNC. Pt reported that the diarrhea is watery and he goes 3-4 x a day. Anything that he eats goes right through him. Pt denied any fever, chills, headaches, cough, sore throat , or any other symptoms. He stated that he is taking imodium 2 x daily which helps a little bit. Pt saw his PCP two weeks ago and PCP told pt to contact transplant. Pt could not locate the phone number to do so. Pt reported PCP did not order any more tests or provide medications. He did recommend proceeding with a colonoscopy. Reviewed office note from 05/2022 and colonoscopy had been ordered for the pt in 05/2022 but not scheduled. TNC stated that she will touch base with Vallery Sa ANP to see what suggestions she had. Pt was reminded to continue drinking water to prevent hydration. Pt reported that he has been drinking a lot of water. He stated that he has lost 50 lbs this year and now weighs 138-140 pounds.     An update was sent to [facility]. Inquired if she wanted another GI pathogen and C.diff panel completed prior to the pt scheduling the colonoscopy that had been ordered back in March 2024. Ms. Eppie Gibson stated that the pt's weight was 140ish last year, and 145 in 2019. Pt should decrease the magnesium to 1 tab BID and schedule the colonoscopy for as soon as possible.     A call was placed to the pt and an update was provided to decrease his magnesium to 1 tablet BID, to get his blood work repeated next week and to schedule the colonoscopy as soon as possible. TNC provided the phone number to schedule: [phone] Option 1 and then Option 2 for procedures. Pt verbalized his understanding and will contact the TNC after he has scheduled the colonoscopy.     Prescription for magnesium was updated on the medication list.

## 2022-10-07 NOTE — Unmapped (Signed)
Colonoscopy  Procedure #1     Procedure #2   [id number]  MRN   Generic   Endoscopist     Is the patient's health insurance [address], [facility]), or [employer]?     Urgent procedure     Are you pregnant?     Are you in the process of scheduling or awaiting results of a heart ultrasound, stress test, or catheterization to evaluate new or worsening chest pain, dizziness, or shortness of breath?     Do you take: Plavix (clopidogrel), Coumadin (warfarin), Lovenox (enoxaparin), Pradaxa (dabigatran), Effient (prasugrel), Xarelto (rivaroxaban), Eliquis (apixaban), Pletal (cilostazol), or Brilinta (ticagrelor)?          Which of the above medications are you taking?          What is the name of the medical practice that manages this medication?          What is the name of the medical provider who manages this medication?     Do you have hemophilia, von Willebrand disease, or low platelets?     Do you have a pacemaker or implanted cardiac defibrillator?     Has a [place] GI provider specified the location(s)?     Which location(s) did the [facility] provider specify[facility]        [facility]        [place]        [facility]        [facility]-Mod Sedation     Is procedure indication for variceal banding (this does NOT include variceal screening)?     Have you had a heart attack, stroke or heart stent placement within the past 6 months?     Month of event     Year of event (ONLY ENTER LAST 2 DIGITS)        5  Height (feet)   9  Height (inches)   141  Weight (pounds)   20.8  BMI          Did the ordering provider specify a bowel prep?          What bowel prep was specified?     Do you have chronic kidney disease?     Do you have chronic constipation or have you had poor quality bowel preps for past colonoscopies?     Do you have Crohn's disease or ulcerative colitis?     Have you had weight loss surgery?          When you walk around your house or grocery store, do you have to stop and rest due to shortness of breath, chest pain, or light-headedness?     Do you ever use supplemental oxygen?     Have you been hospitalized for cirrhosis of the liver or heart failure in the last 12 months?     Have you been treated for mouth or throat cancer with radiation or surgery?     Have you been told that it is difficult for doctors to insert a breathing tube in you during anesthesia?     Have you had a heart or lung transplant?          Are you on dialysis?     Do you have cirrhosis of the liver?     Do you have myasthenia gravis?     Is the patient a prisoner?          Have you been diagnosed with sleep apnea or do you wear  a CPAP machine at night?     Are you younger than 30?   TRUE  Have you previously received propofol sedation administered by an anesthesiologist for a GI procedure?     Do you drink an average of more than 3 drinks of alcohol per day?     Do you regularly take suboxone or any prescription medications for chronic pain?     Do you regularly take Ativan, Klonopin, Xanax, Valium, lorazepam, clonazepam, alprazolam, or diazepam?     Have you previously had difficulty with sedation during a GI procedure?     Have you been diagnosed with PTSD?     Are you allergic to fentanyl or midazolam (Versed)?     Do you take medications for HIV?   ################# ## ###################################################################################################################   MRN:          [id number]   Anticoag Review:  No   Nurse Triage:  No   GI Clinic Consult:  No   Procedure(s):  Colonoscopy     0   [facility]  [facility]     [facility]     [place]        Endoscopist:  Generic    Urgent:            No   Prep:               [place]                  ################# ## ###################################################################################################################

## 2022-10-11 ENCOUNTER — Ambulatory Visit: Admit: 2022-10-11 | Discharge: 2022-10-12 | Payer: MEDICARE

## 2022-10-11 LAB — CBC W/ AUTO DIFF
BASOPHILS ABSOLUTE COUNT: 0 10*9/L (ref 0.0–0.1)
BASOPHILS RELATIVE PERCENT: 0.4 %
EOSINOPHILS ABSOLUTE COUNT: 0 10*9/L (ref 0.0–0.5)
EOSINOPHILS RELATIVE PERCENT: 0.6 %
HEMATOCRIT: 38.7 % — ABNORMAL LOW (ref 39.0–48.0)
HEMOGLOBIN: 13.2 g/dL (ref 12.9–16.5)
LYMPHOCYTES ABSOLUTE COUNT: 1.6 10*9/L (ref 1.1–3.6)
LYMPHOCYTES RELATIVE PERCENT: 22.5 %
MEAN CORPUSCULAR HEMOGLOBIN CONC: 34 g/dL (ref 32.0–36.0)
MEAN CORPUSCULAR HEMOGLOBIN: 31.5 pg (ref 25.9–32.4)
MEAN CORPUSCULAR VOLUME: 92.7 fL (ref 77.6–95.7)
MEAN PLATELET VOLUME: 9.1 fL (ref 6.8–10.7)
MONOCYTES ABSOLUTE COUNT: 0.4 10*9/L (ref 0.3–0.8)
MONOCYTES RELATIVE PERCENT: 6.4 %
NEUTROPHILS ABSOLUTE COUNT: 4.9 10*9/L (ref 1.8–7.8)
NEUTROPHILS RELATIVE PERCENT: 70.1 %
NUCLEATED RED BLOOD CELLS: 0 /100{WBCs} (ref <=4)
PLATELET COUNT: 123 10*9/L — ABNORMAL LOW (ref 150–450)
RED BLOOD CELL COUNT: 4.17 10*12/L — ABNORMAL LOW (ref 4.26–5.60)
RED CELL DISTRIBUTION WIDTH: 13.2 % (ref 12.2–15.2)
WBC ADJUSTED: 7 10*9/L (ref 3.6–11.2)

## 2022-10-11 LAB — PHOSPHORUS: PHOSPHORUS: 2.7 mg/dL (ref 2.4–5.1)

## 2022-10-11 LAB — MAGNESIUM: MAGNESIUM: 2 mg/dL (ref 1.6–2.6)

## 2022-10-11 LAB — BILIRUBIN, DIRECT: BILIRUBIN DIRECT: 0.1 mg/dL (ref 0.00–0.30)

## 2022-10-11 LAB — COMPREHENSIVE METABOLIC PANEL
ALBUMIN: 3.9 g/dL (ref 3.4–5.0)
ALKALINE PHOSPHATASE: 97 U/L (ref 46–116)
ALT (SGPT): 10 U/L (ref 10–49)
ANION GAP: 8 mmol/L (ref 5–14)
AST (SGOT): 10 U/L (ref <=34)
BILIRUBIN TOTAL: 0.4 mg/dL (ref 0.3–1.2)
BLOOD UREA NITROGEN: 30 mg/dL — ABNORMAL HIGH (ref 9–23)
BUN / CREAT RATIO: 17
CALCIUM: 9.9 mg/dL (ref 8.7–10.4)
CHLORIDE: 107 mmol/L (ref 98–107)
CO2: 24.5 mmol/L (ref 20.0–31.0)
CREATININE: 1.76 mg/dL — ABNORMAL HIGH
EGFR CKD-EPI (2021) MALE: 40 mL/min/{1.73_m2} — ABNORMAL LOW (ref >=60)
GLUCOSE RANDOM: 308 mg/dL — ABNORMAL HIGH (ref 70–179)
POTASSIUM: 4.8 mmol/L (ref 3.4–4.8)
PROTEIN TOTAL: 6.5 g/dL (ref 5.7–8.2)
SODIUM: 139 mmol/L (ref 135–145)

## 2022-10-11 LAB — GAMMA GT: GAMMA GLUTAMYL TRANSFERASE: 54 U/L

## 2022-10-12 ENCOUNTER — Ambulatory Visit: Admit: 2022-10-12 | Discharge: 2022-10-13 | Discharge status: Against Medical Advice | Payer: MEDICARE

## 2022-10-12 DIAGNOSIS — R197 Diarrhea, unspecified: Principal | ICD-10-CM

## 2022-10-12 DIAGNOSIS — D8489 Other immunodeficiencies (CMS-HCC): Principal | ICD-10-CM

## 2022-10-12 LAB — CBC W/ AUTO DIFF
BASOPHILS ABSOLUTE COUNT: 0.1 10*9/L (ref 0.0–0.1)
BASOPHILS RELATIVE PERCENT: 0.8 %
EOSINOPHILS ABSOLUTE COUNT: 0.1 10*9/L (ref 0.0–0.5)
EOSINOPHILS RELATIVE PERCENT: 0.9 %
HEMATOCRIT: 39.4 % (ref 39.0–48.0)
HEMOGLOBIN: 13.6 g/dL (ref 12.9–16.5)
LYMPHOCYTES ABSOLUTE COUNT: 1.4 10*9/L (ref 1.1–3.6)
LYMPHOCYTES RELATIVE PERCENT: 21.3 %
MEAN CORPUSCULAR HEMOGLOBIN CONC: 34.4 g/dL (ref 32.0–36.0)
MEAN CORPUSCULAR HEMOGLOBIN: 31.9 pg (ref 25.9–32.4)
MEAN CORPUSCULAR VOLUME: 92.8 fL (ref 77.6–95.7)
MEAN PLATELET VOLUME: 9 fL (ref 6.8–10.7)
MONOCYTES ABSOLUTE COUNT: 0.5 10*9/L (ref 0.3–0.8)
MONOCYTES RELATIVE PERCENT: 6.9 %
NEUTROPHILS ABSOLUTE COUNT: 4.6 10*9/L (ref 1.8–7.8)
NEUTROPHILS RELATIVE PERCENT: 70.1 %
NUCLEATED RED BLOOD CELLS: 0 /100{WBCs} (ref <=4)
PLATELET COUNT: 120 10*9/L — ABNORMAL LOW (ref 150–450)
RED BLOOD CELL COUNT: 4.25 10*12/L — ABNORMAL LOW (ref 4.26–5.60)
RED CELL DISTRIBUTION WIDTH: 13.1 % (ref 12.2–15.2)
WBC ADJUSTED: 6.6 10*9/L (ref 3.6–11.2)

## 2022-10-12 LAB — URINALYSIS WITH MICROSCOPY WITH CULTURE REFLEX PERFORMABLE
BACTERIA: NONE SEEN /HPF
BILIRUBIN UA: NEGATIVE
GLUCOSE UA: 1000 — AB
KETONES UA: NEGATIVE
LEUKOCYTE ESTERASE UA: NEGATIVE
NITRITE UA: NEGATIVE
PH UA: 5.5 (ref 5.0–9.0)
PROTEIN UA: NEGATIVE
RBC UA: <1 /HPF (ref <=3)
SPECIFIC GRAVITY UA: 1.012 (ref 1.003–1.030)
SQUAMOUS EPITHELIAL: <1 /HPF (ref 0–5)
UROBILINOGEN UA: <2
WBC UA: <1 /HPF (ref <=2)

## 2022-10-12 LAB — COMPREHENSIVE METABOLIC PANEL
ALBUMIN: 4.2 g/dL (ref 3.4–5.0)
ALKALINE PHOSPHATASE: 97 U/L (ref 46–116)
ALT (SGPT): 10 U/L (ref 10–49)
ANION GAP: 6 mmol/L (ref 5–14)
AST (SGOT): 12 U/L (ref <=34)
BILIRUBIN TOTAL: 0.4 mg/dL (ref 0.3–1.2)
BLOOD UREA NITROGEN: 26 mg/dL — ABNORMAL HIGH (ref 9–23)
BUN / CREAT RATIO: 15
CALCIUM: 10.2 mg/dL (ref 8.7–10.4)
CHLORIDE: 108 mmol/L — ABNORMAL HIGH (ref 98–107)
CO2: 23.1 mmol/L (ref 20.0–31.0)
CREATININE: 1.71 mg/dL — ABNORMAL HIGH
EGFR CKD-EPI (2021) MALE: 41 mL/min/{1.73_m2} — ABNORMAL LOW (ref >=60)
GLUCOSE RANDOM: 273 mg/dL — ABNORMAL HIGH (ref 70–179)
POTASSIUM: 4.8 mmol/L (ref 3.4–4.8)
PROTEIN TOTAL: 6.9 g/dL (ref 5.7–8.2)
SODIUM: 137 mmol/L (ref 135–145)

## 2022-10-12 LAB — CYCLOSPORINE LEVEL: CYCLOSPORINE (FPIA) BLOOD: <10 ng/mL

## 2022-10-12 NOTE — Unmapped (Signed)
Pt presets to ED stating his liver transplant coordinator told him he was dehydrated.   Endorses dark urine. Denies other urinary sx. Denies fever/chills.   He had blood drawn yesterday. Which showed elevated Bun/Creatinine.

## 2022-10-12 NOTE — Unmapped (Signed)
A call was placed to the pt but he was not available. An update was left on voicemail requesting a return call.     Received a return call from the pt. Reviewed lab results from 7/30 with the pt (cyclosporine level pending). Pt reported that he was still having issues with diarrhea. He reported that he has had diarrhea twice this morning. Pt stated that he takes imodium 1-2 a day depending on its effectiveness. He reported that he was drinking lots of water to remain hydrated. Pt also stated that he had scheduled his colonoscopy for 02/06/23. Pt confirmed that he had decreased his magnesium to 1 tablet BID as previously recommended. Pt reported that his urine is dark yellow and has an occasional odor but did not have an odor today. Pt denied any discomfort with urination. Pt also reported feeing tired and lightheaded on occasion. Pt did not share this information with his PCP. Pt denied any other symptoms including fever, cough, or sore throat. Pt was encouraged to increase his hydration due to the dark yellow color of his urine. He was also encouraged to go to the ER for evaluation but he declined. He stated that he did not like doctors. TNC informed the pt that she will discuss further with Vikki Metheny ANP. He verbalized his understanding and will wait to hear from Ms. Metheny what she wanted him to do.     An update was forwarded to [facility] ANP and TNC inquired regarding suggestions for the pt. Advised pt had declined TNC's suggestion to go for evaluation at the ER for possible dehydration (dark yellow urine color, lightheadedness, headache) and diarrhea.    Received a response from [facility] ANP that the pt needs stool pathogen and O&P.  Orders had been placed. Pt also needs to stop the magnesium and go to the ER for evaluation as pt may be dehydrated.     A call was placed to the pt and an update was provided. Pt agreed to proceed to the ED for evaluation. He did not want to drive to [facility] and will go to the ED at [facility]. He was told to also stop the magnesium and Ms. Metheny had placed orders for stool studies to be done. Pt verbalized his understanding.     Removed magnesium from the pt's medication list.

## 2022-10-15 NOTE — Unmapped (Addendum)
A call was returned to the pt. Pt stated that he had left the hospital on 10/12/22 after sitting in the waiting room for several hours. Pt reported that he continues to have diarrhea but is drinking a lot of water and taking imodium as needed.  Reviewed lab results taken on 7/31 when pt was in the ER. Creatinine remains elevated at 1.71. Glucose was also elevated. Pt stated that he has contacted his PCP to schedule an appointment. TNC also reviewed the pt's cyclosporine level on 7/30 that was < 10. Informed the pt that his goal is 50-100. Reviewed how pt is taking his medication and he confirmed that the was taking 200 mg in the AM and 175 mg in the evening. Pt agreed to get labs repeated next week.

## 2022-10-21 NOTE — Unmapped (Signed)
A call was placed to the pt. He stated that he was going to get his lab work done around 4:00 PM this afternoon. TNC confirmed that the pt takes his cyclosporine around 9:00 AM and then 10:00 PM. Informed the pt that a 12 hour trough was needed and that waiting until 4:00 PM today will not provide an accurate cyclosporine level. Pt was requested to go sometime in the morning next week (around 10:00 AM) and pt verbalized his understanding. He will update the TNC when he goes to get his blood work drawn.

## 2022-11-09 MED FILL — LANTUS SOLOSTAR U-100 INSULIN 100 UNIT/ML (3 ML) SUBCUTANEOUS PEN: SUBCUTANEOUS | 107 days supply | Qty: 15 | Fill #1

## 2022-11-16 NOTE — Unmapped (Signed)
9/4: patient declined refills on ALL medications today, would like call back in 1 week due to supply on hand Jeffrey Ward    [facility] Clinical Assessment & Refill Coordination Note    Jeffrey Ward, DOB: Jan 31, 1951  Phone: [phone] (home)     All above HIPAA information was verified with patient.     Was a Nurse, learning disability used for this call? 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 Do not lie down or eat/drink anything else for the next 30 min. 12 tablet 3    BASAGLAR KWIKPEN U-100 INSULIN 100 unit/mL (3 mL) injection pen Inject 0.14 mL (14 Units total) under the skin at bedtime. 15 mL 4    BASAGLAR KWIKPEN U-100 INSULIN 100 unit/mL (3 mL) injection pen Inject 14 Units subcutaneously at bedtime 15 mL 4    blood-glucose meter (GLUCOSE MONITORING KIT) kit Use as instructed 1 each 0    cholecalciferol, vitamin D3, 125 mcg (5,000 unit) tablet Take 1 tablet (125 mcg total) by mouth daily.      cycloSPORINE modified (NEORAL) 100 MG capsule Take 2 capsules (200 mg total) by mouth daily AND 1 capsule (100 mg total) nightly. 270 capsule 3    cycloSPORINE modified (NEORAL) 25 MG capsule Take 3 capsules (75 mg total) by mouth nightly (with a 100mg  capsule). For a total dose of 200 mg in the morning , 175mg  every evening 270 capsule 3    DULoxetine (CYMBALTA) 30 MG capsule Take 2 capsules (60 mg total) by mouth nightly. 60 capsule 5    insulin glargine (BASAGLAR, LANTUS) 100 unit/mL (3 mL) injection pen Inject 0.14 mL (14 Units total) under the skin two (2) times a day. 15 mL 3    insulin glargine (BASAGLAR, LANTUS) 100 unit/mL (3 mL) injection pen Inject 0.14 mL (14 Units total) under the skin at bedtime. 15 mL 2    melatonin 3 mg Tab Take 1 tablet (3 mg total) by mouth nightly. 30 tablet 0    metoprolol succinate (TOPROL-XL) 25 MG 24 hr tablet Take 1 tablet (25 mg total) by mouth.      mirtazapine (REMERON) 15 MG tablet Take 1 tablet (15 mg total) by mouth nightly. 90 tablet 3    mycophenolate (CELLCEPT) 250 mg capsule Take 2 capsules (500 mg total) by mouth two (2) times a day. 120 capsule 11    nortriptyline (PAMELOR) 10 MG capsule Take 1 capsule (10 mg total) by mouth nightly. 30 capsule 11    pen needle, diabetic (PEN NEEDLE) 32 gauge x 5/32 (4 mm) Ndle Use with insulin twice a day 200 each 1    pen needle, diabetic (ULTICARE PEN NEEDLE) 32 gauge x 5/32 (4 mm) Ndle USE WITH INSULIN PEN 4 TIMES DAILY AS DIRECTED 100 each 1    polyethylene glycol (GOLYTELY) 236-22.74-6.74 gram solution Take by mouth as directed per [facility] GI prep instructions, for split bowel prep. 4000 mL 0    rosuvastatin (CRESTOR) 5 MG tablet Take 1 tablet (5 mg total) by mouth daily. 90 tablet 3    sildenafiL, pulm.hypertension, (REVATIO) 20 mg tablet Take 2 tablets (40 mg total) by mouth Three (3) times a day. 180 tablet 6    tamsulosin (FLOMAX)  0.4 mg capsule Take 1 capsule (0.4 mg total) by mouth daily. 90 capsule 0     No current facility-administered medications for this visit.        Changes to medications: Jeffrey Ward reports no changes at this time.    No Known Allergies    Changes to allergies: No    SPECIALTY MEDICATION ADHERENCE     Mycophenolate 250mg   : 20 days of medicine on hand   Cyclosporine 25mg   : 20 days of medicine on hand   Cyclosporine 100mg   : 20 days of medicine on hand       Medication Adherence    Patient reported X missed doses in the last month: 0  Specialty Medication: cyclosporine 25mg   Patient is on additional specialty medications: Yes  Additional Specialty Medications: Cyclosporine 100mg   Patient Reported Additional Medication X Missed Doses in the Last Month: 0  Patient is on more than two specialty medications: Yes  Specialty Medication: mycophenolate 250mg   Patient Reported Additional Medication X Missed Doses in the Last Month: 0  Support network for adherence: family member          Specialty medication(s) dose(s) confirmed: Regimen is correct and unchanged.     Are there any concerns with adherence? No    Adherence counseling provided? Not needed    CLINICAL MANAGEMENT AND INTERVENTION      Clinical Benefit Assessment:    Do you feel the medicine is effective or helping your condition? Yes    Clinical Benefit counseling provided? Not needed    Adverse Effects Assessment:    Are you experiencing any side effects? No    Are you experiencing difficulty administering your medicine? No    Quality of Life Assessment:    Quality of Life    Rheumatology  Oncology  Dermatology  Cystic Fibrosis          How many days over the past month did your transplant  keep you from your normal activities? For example, brushing your teeth or getting up in the morning. 0    Have you discussed this with your provider? Not needed    Acute Infection Status:    Acute infections noted within Epic:  No active infections  Patient reported infection: None    Therapy Appropriateness:    Is therapy appropriate based on current medication list, adverse reactions, adherence, clinical benefit and progress toward achieving therapeutic goals? Yes, therapy is appropriate and should be continued     DISEASE/MEDICATION-SPECIFIC INFORMATION      N/A    Solid Organ Transplant: Not Applicable    PATIENT SPECIFIC NEEDS     Does the patient have any physical, cognitive, or cultural barriers? No    Is the patient high risk? Yes, patient is taking a REMS drug. Medication is dispensed in compliance with REMS program    Did the patient require a clinical intervention? No    Does the patient require physician intervention or other additional services (i.e., nutrition, smoking cessation, social work)? No    SOCIAL DETERMINANTS OF HEALTH     At the [facility], we have learned that life circumstances - like trouble affording food, housing, utilities, or transportation can affect the health of many of our patients.   That is why we wanted to ask: are you currently experiencing any life circumstances that are negatively impacting your health and/or quality of life? Patient declined to answer    Social Determinants of Health     Financial  Resource Strain: Not on file   Internet Connectivity: Not on file   Food Insecurity: Not on file   Tobacco Use: Low Risk  (08/24/2022)    Received from [facility]    Patient History     Smoking Tobacco Use: Never     Smokeless Tobacco Use: Never     Passive Exposure: Not on file   Housing/Utilities: Not on file   Alcohol Use: Not At Risk (02/01/2022)    Received from [facility]    AUDIT-C     Frequency of Alcohol Consumption: Never     Average Number of Drinks: Patient does not drink     Frequency of Binge Drinking: Never   Transportation Needs: Not on file   Substance Use: Not on file   Health Literacy: Not on file   Physical Activity: Not on file   Interpersonal Safety: Unknown (11/16/2022)    Interpersonal Safety     Unsafe Where You Currently Live: Not on file     Physically Hurt by Anyone: Not on file     Abused by Anyone: Not on file   Stress: Not on file   Intimate Partner Violence: Not on file   Depression: Not at risk (02/01/2022)    Received from [facility]    PHQ-2     Total Score =: 0   Social Connections: Not on file       Would you be willing to receive help with any of the needs that you have identified today? Not applicable       SHIPPING     Specialty Medication(s) to be Shipped:   N/a    Other medication(s) to be shipped: No additional medications requested for fill at this time     Changes to insurance: No    Delivery Scheduled: Patient declined refill at this time due to supply on hand, wants call back in 1 week.     Medication will be delivered via  n/a  to the confirmed  n/a address in [facility].    The patient will receive a drug information handout for each medication shipped and additional FDA Medication Guides as required.  Verified that patient has previously received a Conservation officer, historic buildings and a Surveyor, mining.    The patient or caregiver noted above participated in the development of this care plan and knows that they can request review of or adjustments to the care plan at any time.      All of the patient's questions and concerns have been addressed.    Thad Ranger, PharmD   [facility] Specialty Pharmacist

## 2022-11-16 NOTE — Unmapped (Signed)
LWBS/AMA Patient Follow-Up Call  Chart reviewed after patient LWBS after triage. Patient has since been evaluated by another provider, per chart documentation. No further actions needed.

## 2022-11-22 MED ORDER — DULOXETINE 30 MG CAPSULE,DELAYED RELEASE
ORAL_CAPSULE | Freq: Every evening | ORAL | 11 refills | 30 days | Status: CP
Start: 2022-11-22 — End: 2023-11-22
  Filled 2022-11-24: qty 50, 25d supply, fill #0

## 2022-11-22 NOTE — Unmapped (Signed)
Refill request received from patient.      Medication Requested: Cymbalta 30 mg   Last Office Visit: 02/17/2022   Next Office Visit: Visit date not found  Last Prescriber: Dr Sima Matas    Nurse refill requirements met? No  If not met, why: Medication not on active SOP    Sent to: Provider for signing  If sent to provider, which provider?: Dr Sima Matas

## 2022-11-22 NOTE — Unmapped (Signed)
[facility] Refill Coordination Note    Specialty Medication(s) to be Shipped:   Transplant: mycophenolate mofetil 250 mg, cyclosporine 25 mg, and cyclosporine 100 mg    Other medication(s) to be shipped:  nortriptyline,duloxetine, alendronate      Jeffrey Ward, DOB: 05-Oct-1950  Phone: [phone] (home)       All above HIPAA information was verified with patient.     Was a Nurse, learning disability used for this call? No    Completed refill call assessment today to schedule patient's medication shipment from the [facility] [phone]).  All relevant notes have been reviewed.     Specialty medication(s) and dose(s) confirmed: Regimen is correct and unchanged.   Changes to medications: Jeffrey Ward reports no changes at this time.  Changes to insurance: No  New side effects reported not previously addressed with a pharmacist or physician: None reported  Questions for the pharmacist: No    Confirmed patient received a Conservation officer, historic buildings and a Surveyor, mining with first shipment. The patient will receive a drug information handout for each medication shipped and additional FDA Medication Guides as required.       DISEASE/MEDICATION-SPECIFIC INFORMATION        N/A    SPECIALTY MEDICATION ADHERENCE     Medication Adherence    Specialty Medication: cycloSPORINE modified 100 MG capsule (Neoral)  Patient is on additional specialty medications: Yes  Additional Specialty Medications: cycloSPORINE modified 100 MG capsule (Neoral)  Patient is on more than two specialty medications: Yes  Specialty Medication: mycophenolate 250 mg capsule (CELLCEPT)  Support network for adherence: family member              Were doses missed due to medication being on hold? No      cycloSPORINE modified 100 MG capsule (Neoral): 4 days of medicine on hand     cycloSPORINE modified 25 MG capsule (Neoral): 4 days of medicine on hand     mycophenolate 250 mg capsule (CELLCEPT): 4 days of medicine on hand       REFERRAL TO PHARMACIST     Referral to the pharmacist: Not needed      [facility]     Shipping address confirmed in Epic.       Delivery Scheduled: Yes, Expected medication delivery date: 11/24/2022.     Medication will be delivered via Same Day Courier to the prescription address in Epic [place].    Madilynne Mullan J Helane Gunther   [facility] Specialty Technician

## 2022-11-24 MED FILL — MYCOPHENOLATE MOFETIL 250 MG CAPSULE: ORAL | 30 days supply | Qty: 120 | Fill #4

## 2022-11-24 MED FILL — ALENDRONATE 70 MG TABLET: 84 days supply | Qty: 12 | Fill #0

## 2022-11-24 MED FILL — CYCLOSPORINE MODIFIED 100 MG CAPSULE: ORAL | 30 days supply | Qty: 90 | Fill #2

## 2022-11-24 MED FILL — NORTRIPTYLINE 10 MG CAPSULE: ORAL | 30 days supply | Qty: 30 | Fill #3

## 2022-11-24 MED FILL — CYCLOSPORINE MODIFIED 25 MG CAPSULE: ORAL | 30 days supply | Qty: 90 | Fill #2

## 2023-01-09 NOTE — Unmapped (Signed)
Messaged Pt through MyChart to obtain labs. No labs since 10/12/22. Pt is active in MyChart.

## 2023-01-12 NOTE — Unmapped (Signed)
The [facility] has made a second and final attempt to reach this patient to refill the following medication:cycloSPORINE modified 100 MG capsule (Neoral),cycloSPORINE modified 25 MG capsule (Neoral).      We have left voicemails on the following phone numbers: [phone], have been unable to leave messages on the following phone numbers: [phone], have sent a MyChart message, and have sent a text message to the following phone numbers: [phone] .    Dates contacted: 10/03,10/31  Last scheduled delivery: 11/24/2022    The patient may be at risk of non-compliance with this medication. The patient should call the [facility] at [phone]  Option 4, then Option 4: Infectious Disease, Transplant to refill medication.    Jorje Guild Specialty and Home Delivery Oncologist

## 2023-01-13 MED ORDER — PEN NEEDLE, DIABETIC 32 GAUGE X 5/32" (4 MM)
1 refills | 0 days
Start: 2023-01-13 — End: ?

## 2023-01-13 NOTE — Unmapped (Signed)
[facility] Refill Coordination Note    Specialty Medication(s) to be Shipped:   Transplant: mycophenolate mofetil 250 mg, cyclosporine 25 mg, and cyclosporine 100 mg    Other medication(s) to be shipped:  nortriptyline,duloxetine, alendronate      Jeffrey Ward, DOB: 1950-07-26  Phone: [phone] (home)       All above HIPAA information was verified with patient.     Was a Nurse, learning disability used for this call? No    Completed refill call assessment today to schedule patient's medication shipment from the [facility] [phone]).  All relevant notes have been reviewed.     Specialty medication(s) and dose(s) confirmed: Regimen is correct and unchanged.   Changes to medications: Takeshi reports no changes at this time.  Changes to insurance: No  New side effects reported not previously addressed with a pharmacist or physician: None reported  Questions for the pharmacist: No    Confirmed patient received a Conservation officer, historic buildings and a Surveyor, mining with first shipment. The patient will receive a drug information handout for each medication shipped and additional FDA Medication Guides as required.       DISEASE/MEDICATION-SPECIFIC INFORMATION        N/A    SPECIALTY MEDICATION ADHERENCE     Medication Adherence    Patient reported X missed doses in the last month: 1-2  Specialty Medication: cycloSPORINE modified 100 MG capsule (Neoral)  Patient is on additional specialty medications: Yes  Additional Specialty Medications: cycloSPORINE modified 25 MG capsule (Neoral)  Patient Reported Additional Medication X Missed Doses in the Last Month: 1-2  Patient is on more than two specialty medications: Yes  Specialty Medication: mycophenolate 250 mg capsule (CELLCEPT)  Patient Reported Additional Medication X Missed Doses in the Last Month: 1-2  Patient Reported Additional Medication X Missed Doses in the Last Month: 0  Support network for adherence: family member              Were doses missed due to medication being on hold? No      cycloSPORINE modified 100 MG capsule (Neoral): 7 days of medicine on hand     cycloSPORINE modified 25 MG capsule (Neoral): 7 days of medicine on hand     mycophenolate 250 mg capsule (CELLCEPT): 7 days of medicine on hand       REFERRAL TO PHARMACIST     Referral to the pharmacist: Not needed      [facility]     Shipping address confirmed in Epic.       Delivery Scheduled: Yes, Expected medication delivery date: 01/17/2023.     Medication will be delivered via Next Day Courier to the prescription address in Epic [place].    Gaspar Cola [facility] Specialty Technician

## 2023-01-16 NOTE — Unmapped (Signed)
Jeffrey Ward 's entire shipment will be delayed as a result of credit card charge failed.     I have reached out to the patient  via MyChart message and communicated the delay. We will wait for a call back from the patient to reschedule the delivery.  We have not confirmed the new delivery date.

## 2023-01-19 NOTE — Unmapped (Signed)
Jeffrey Ward 's entire shipment will be canceled as a result of credit card failed and unable to reach pt to approve billing and/or re-schedule.    I have reached out to the patient  at [phone]  and left a voicemail message.  We will not reschedule the medication and have removed this/these medication(s) from the work request.  We have canceled this work request.

## 2023-02-02 MED ORDER — PEG 3350-ELECTROLYTES 236 GRAM-22.74 GRAM-6.74 GRAM-5.86 GRAM SOLUTION
0 refills | 0 days | Status: CP
Start: 2023-02-02 — End: ?

## 2023-02-02 NOTE — Unmapped (Signed)
Attempted pre-procedure phone call on 02/02/23, however, VM was full.  Unable to leave a message at this time.

## 2023-02-07 DIAGNOSIS — Z79899 Other long term (current) drug therapy: Principal | ICD-10-CM

## 2023-02-07 DIAGNOSIS — Z944 Liver transplant status: Principal | ICD-10-CM

## 2023-02-07 MED ORDER — SILDENAFIL (PULMONARY HYPERTENSION) 20 MG TABLET
ORAL_TABLET | Freq: Three times a day (TID) | ORAL | 6 refills | 30 days
Start: 2023-02-07 — End: ?

## 2023-02-07 MED ORDER — ROSUVASTATIN 5 MG TABLET
ORAL_TABLET | Freq: Every day | ORAL | 3 refills | 90 days
Start: 2023-02-07 — End: ?

## 2023-02-07 MED ORDER — MYCOPHENOLATE MOFETIL 250 MG CAPSULE
ORAL_CAPSULE | Freq: Two times a day (BID) | ORAL | 11 refills | 30 days | Status: CP
Start: 2023-02-07 — End: 2024-02-07
  Filled 2023-02-13: qty 120, 30d supply, fill #0

## 2023-02-07 NOTE — Unmapped (Signed)
[facility] Delivery Pharmacy Refill Coordination Note    Specialty Medication(s) to be Shipped:   Transplant: mycophenolate mofetil 250mg     Other medication(s) to be shipped:  sildenafil.pulm.hypertension, rosuvastatin     Jeffrey Ward, DOB: April 28, 1950  Phone: [phone] (home)       All above HIPAA information was verified with patient.     Was a Nurse, learning disability used for this call? No    Completed refill call assessment today to schedule patient's medication shipment from the [facility]  [phone]).  All relevant notes have been reviewed.     Specialty medication(s) and dose(s) confirmed: Regimen is correct and unchanged.   Changes to medications: Jeffrey Ward reports no changes at this time.  Changes to insurance: No  New side effects reported not previously addressed with a pharmacist or physician: None reported  Questions for the pharmacist: No    Confirmed patient received a Conservation officer, historic buildings and a Surveyor, mining with first shipment. The patient will receive a drug information handout for each medication shipped and additional FDA Medication Guides as required.       DISEASE/MEDICATION-SPECIFIC INFORMATION        N/A    SPECIALTY MEDICATION ADHERENCE     Medication Adherence    Patient reported X missed doses in the last month: 0  Specialty Medication: mycophenolate 250 mg capsule (CELLCEPT)  Patient is on additional specialty medications: No  Patient is on more than two specialty medications: No  Any gaps in refill history greater than 2 weeks in the last 3 months: no  Demonstrates understanding of importance of adherence: yes  Informant: patient  Support network for adherence: family member  Confirmed plan for next specialty medication refill: delivery by pharmacy  Refills needed for supportive medications: yes, ordered or provider notified          Refill Coordination    Has the Patients' Contact Information Changed: No  Is the Shipping Address Different: No Were doses missed due to medication being on hold? No    mycophenolate 250   mg: 5 days of medicine on hand       REFERRAL TO PHARMACIST     Referral to the pharmacist: Not needed      [facility]     Shipping address confirmed in Epic.       Delivery Scheduled: Yes, Expected medication delivery date: 02/13/23.  However, Rx request for refills was sent to the provider as there are none remaining.     Medication will be delivered via Same Day Courier to the prescription address in Epic [facility].    Jeffrey Ward   [facility] Delivery Pharmacy  Specialty Technician

## 2023-02-07 NOTE — Unmapped (Signed)
Received request for Cellcept refill to [facility], order placed.

## 2023-02-07 NOTE — Unmapped (Signed)
Pt request for RX Refill

## 2023-02-07 NOTE — Unmapped (Signed)
[facility] Refill Coordination Note    Specialty Medication(s) to be Shipped:   Transplant: cyclosporine 100mg  and cyclosporine 25mg     Other medication(s) to be shipped:  duloxetine, Lantus, nortriptyline, Pen needles     Jeffrey Ward, DOB: 06/07/1950  Phone: [phone] (home)       All above HIPAA information was verified with patient.     Was a Nurse, learning disability used for this call? No    Completed refill call assessment today to schedule patient's medication shipment from the [facility]  [phone]).  All relevant notes have been reviewed.     Specialty medication(s) and dose(s) confirmed: Regimen is correct and unchanged.   Changes to medications: [name] reports no changes at this time.  Changes to insurance: No  New side effects reported not previously addressed with a pharmacist or physician: None reported  Questions for the pharmacist: No    Confirmed patient received a Conservation officer, historic buildings and a Surveyor, mining with first shipment. The patient will receive a drug information handout for each medication shipped and additional FDA Medication Guides as required.       DISEASE/MEDICATION-SPECIFIC INFORMATION        N/A    SPECIALTY MEDICATION ADHERENCE     Medication Adherence    Patient reported X missed doses in the last month: 0  Specialty Medication: cycloSPORINE modified 100 MG capsule (Neoral)  Patient is on additional specialty medications: Yes  Additional Specialty Medications: cycloSPORINE modified 25 MG capsule (Neoral)  Patient Reported Additional Medication X Missed Doses in the Last Month: 0  Patient is on more than two specialty medications: No  Any gaps in refill history greater than 2 weeks in the last 3 months: no  Demonstrates understanding of importance of adherence: yes  Informant: patient  Support network for adherence: family member  Confirmed plan for next specialty medication refill: delivery by pharmacy  Refills needed for supportive medications: not needed          Refill Coordination    Has the Patients' Contact Information Changed: No  Is the Shipping Address Different: No         Were doses missed due to medication being on hold? No    cycloSPORINE modified 25  mg: 4 days of medicine on hand   cycloSPORINE modified 100  mg: 4 days of medicine on hand       REFERRAL TO PHARMACIST     Referral to the pharmacist: Not needed      [facility]     Shipping address confirmed in Epic.       Delivery Scheduled: Yes, Expected medication delivery date: 02/08/23.     Medication will be delivered via Same Day Courier to the prescription address in Epic WAM.    [name]   [facility]  Specialty Technician

## 2023-02-08 MED FILL — CYCLOSPORINE MODIFIED 100 MG CAPSULE: ORAL | 30 days supply | Qty: 90 | Fill #3

## 2023-02-08 MED FILL — ULTICARE PEN NEEDLE 32 GAUGE X 5/32" (4 MM): 100 days supply | Qty: 200 | Fill #0

## 2023-02-08 MED FILL — NORTRIPTYLINE 10 MG CAPSULE: ORAL | 30 days supply | Qty: 30 | Fill #4

## 2023-02-08 MED FILL — CYCLOSPORINE MODIFIED 25 MG CAPSULE: ORAL | 30 days supply | Qty: 90 | Fill #3

## 2023-02-08 MED FILL — LANTUS SOLOSTAR U-100 INSULIN 100 UNIT/ML (3 ML) SUBCUTANEOUS PEN: SUBCUTANEOUS | 107 days supply | Qty: 15 | Fill #2

## 2023-02-08 MED FILL — DULOXETINE 30 MG CAPSULE,DELAYED RELEASE: ORAL | 25 days supply | Qty: 50 | Fill #1

## 2023-02-08 NOTE — Unmapped (Deleted)
Jeffrey Ward 's entire shipment will be delayed as a result of credit card ending in [id number] declined     I have reached out to the patient  at [phone]  and was unable to leave a message. I have also reached out via text to the same number and via MyChart message. We will wait for a call back from the patient to reschedule the delivery.  We have not confirmed the new delivery date.

## 2023-02-13 MED ORDER — ROSUVASTATIN 5 MG TABLET
ORAL_TABLET | Freq: Every day | ORAL | 3 refills | 90 days
Start: 2023-02-13 — End: ?

## 2023-02-13 NOTE — Unmapped (Signed)
This needs to be filled by Dr. Jeralyn Ruths. Thanks

## 2023-02-14 MED ORDER — ROSUVASTATIN 5 MG TABLET
ORAL_TABLET | Freq: Every day | ORAL | 3 refills | 90 days
Start: 2023-02-14 — End: ?

## 2023-02-14 NOTE — Unmapped (Signed)
This should be filled by primary care provider. I haven't seen the patient in > 1 year.     Thanks,   [employer]

## 2023-02-16 MED ORDER — SILDENAFIL (PULMONARY HYPERTENSION) 20 MG TABLET
ORAL_TABLET | Freq: Three times a day (TID) | ORAL | 6 refills | 30 days | Status: CP
Start: 2023-02-16 — End: ?
  Filled 2023-02-20: qty 180, 30d supply, fill #0

## 2023-02-16 NOTE — Unmapped (Signed)
12/5 - Contacted pt to schedule for '25 annual and LVM for pt to return ph call.

## 2023-02-20 MED FILL — ROSUVASTATIN 5 MG TABLET: ORAL | 90 days supply | Qty: 90 | Fill #0

## 2023-02-20 NOTE — Unmapped (Unsigned)
ERROR have worsened over the years. He feels incomplete bladder emptying with small voids. He has severe urge incontinence. Urinary symptoms started since he injured his back in 2020.  The patient reports a history of pyelonephritis and is not on any medical treatment for this. He is followed by nephrology. Patient denies history of nephrolithiasis. He also complains of erectile dysfunction. Since he was last seen, the patient         has been overall well. He reports improvement in his urinary frequency and leakage. He believes that he has better control over his diabetes. The patient denies a history of dysuria, hematuria or urinary tract infection. He has never had a prostate biopsy in the past. He has no family history of prostate cancer. He does not have a history of sleep apnea or significant snoring. The patient has no history of fevers, chills, nausea, vomiting, chest pain or shortness of breath.     PAST MEDICAL HISTORY:     Past Medical History:   Diagnosis Date    Atrial fibrillation (CMS-HCC)     Basal cell carcinoma     Cancer (CMS-HCC)     Cirrhosis (CMS-HCC)     Depression     Diabetes (CMS-HCC)     Headache     Hepatitis C 07/17/2012    History of transfusion     Liver disease     Low back pain 07/17/2012    Varices, esophageal (CMS-HCC)        PAST SURGICAL HISTORY:   Past Surgical History:   Procedure Laterality Date    ANKLE SURGERY      BACK SURGERY      BACK SURGERY      CARDIAC SURGERY      pacemaker    CHG US GUIDE, TISSUE ABLATION N/A 01/22/2016    Procedure: ULTRASOUND GUIDANCE FOR, AND MONITORING OF, PARENCHYMAL TISSUE ABLATION;  Surgeon: Particia Nearing, MD;  Location: MAIN OR ;  Service: Transplant    IR EMBOLIZATION ORGAN ISCHEMIA, TUMORS, INFAR  06/16/2016    IR EMBOLIZATION ORGAN ISCHEMIA, TUMORS, INFAR 06/16/2016 Ammie Dalton, MD IMG VIR [facility]    PR COLON CA SCRN NOT HI RSK IND N/A 02/27/2015    Procedure: COLOREC CNCR SCR;COLNSCPY NO;  Surgeon: Vonda Antigua, MD;  Location: [facility]URES [name];  Service: Gastroenterology    PR COLONOSCOPY FLX DX W/COLLJ SPEC WHEN PFRMD N/A 02/06/2023    Procedure: COLONOSCOPY, FLEXIBLE, PROXIMAL TO SPLENIC FLEXURE; DIAGNOSTIC, W/WO COLLECTION SPECIMEN BY BRUSH OR WASH;  Surgeon: Stephannie Li, MD;  Location: GI PROCEDURES [facility];  Service: Gastroenterology    PR ENDOSCOPIC ULTRASOUND EXAM N/A 02/27/2015    Procedure: UGI ENDO; W/ENDO ULTRASOUND EXAM INCLUDES ESOPHAGUS, STOMACH, &/OR DUODENUM/JEJUNUM;  Surgeon: Vonda Antigua, MD;  Location: GI PROCEDURES [facility];  Service: Gastroenterology    PR ERCP BALLOON DILATE BILIARY/PANC DUCT/AMPULLA EA N/A 02/10/2017    Procedure: ERCP;WITH TRANS-ENDOSCOPIC BALLOON DILATION OF BILIARY/PANCREATIC DUCT(S) OR OF AMPULLA, INCLUDING SPHINCTERECTOMY, WHEN PERFOREMD,EACH DUCT ([address]109);  Surgeon: Mayford Knife, MD;  Location: GI PROC[facility]URES [facility];  Service: Gastroenterology    PR ERCP REMOVE FOREIGN BODY/STENT BILIARY/PANC DUCT N/A 02/10/2017    Procedure: ENDOSCOPIC RETROGRADE CHOLANGIOPANCREATOGRAPHY (ERCP); W/ REMOVAL OF FOREIGN BODY/STENT FROM BILIARY/PANCREATIC DUCT(S);  Surgeon: Mayford Knife, MD;  Location: GI PROCEDURES [facility];  Service: Gastroenterology    PR ERCP REMOVE FOREIGN BODY/STENT BILIARY/PANC DUCT N/A 06/29/2017    Procedure: ENDOSCOPIC RETROGRADE CHOLANGIOPANCREATOGRAPHY (ERCP); W/ REMOVAL OF FOREIGN  BODY/STENT FROM BILIARY/PANCREATIC DUCT(S);  Surgeon: Vonda Antigua, MD;  Location: GI PROCEDURES [facility];  Service: Gastroenterology    PR ERCP STENT PLACEMENT BILIARY/PANCREATIC DUCT N/A 11/29/2016    Procedure: ENDOSCOPIC RETROGRADE CHOLANGIOPANCREATOGRAPHY (ERCP); WITH PLACEMENT OF ENDOSCOPIC STENT INTO BILIARY OR PANCREATIC DUCT;  Surgeon: Chriss Driver, MD;  Location: GI PROCEDURES [facility];  Service: Gastroenterology    PR ERCP STENT PLACEMENT BILIARY/PANCREATIC DUCT N/A 04/26/2017    Procedure: ENDOSCOPIC RETROGRADE CHOLANGIOPANCREATOGRAPHY (ERCP); WITH PLACEMENT OF ENDOSCOPIC STENT INTO BILIARY OR PANCREATIC DUCT;  Surgeon: Mayford Knife, MD;  Location: GI PROCEDURES [facility];  Service: Gastroenterology    PR ERCP,W/REMOVAL STONE,BIL/PANCR DUCTS N/A 11/29/2016    Procedure: ERCP; W/ENDOSCOPIC RETROGRADE REMOVAL OF CALCULUS/CALCULI FROM BILIARY &/OR PANCREATIC DUCTS;  Surgeon: Chriss Driver, MD;  Location: GI PROCEDURES [facility];  Service: Gastroenterology    PR ERCP,W/REMOVAL STONE,BIL/PANCR DUCTS N/A 02/10/2017    Procedure: ERCP; W/ENDOSCOPIC RETROGRADE REMOVAL OF CALCULUS/CALCULI FROM BILIARY &/OR PANCREATIC DUCTS;  Surgeon: Mayford Knife, MD;  Location: GI PROCEDURES [facility];  Service: Gastroenterology    PR ERCP,W/REMOVAL STONE,BIL/PANCR DUCTS N/A 04/26/2017    Procedure: ERCP; W/ENDOSCOPIC RETROGRADE REMOVAL OF CALCULUS/CALCULI FROM BILIARY &/OR PANCREATIC DUCTS;  Surgeon: Mayford Knife, MD;  Location: GI PROCEDURES [facility];  Service: Gastroenterology    PR ERCP,W/REMOVAL STONE,BIL/PANCR DUCTS N/A 06/29/2017    Procedure: ERCP; W/ENDOSCOPIC RETROGRADE REMOVAL OF CALCULUS/CALCULI FROM BILIARY &/OR PANCREATIC DUCTS;  Surgeon: Vonda Antigua, MD;  Location: GI PROCEDURES [facility];  Service: Gastroenterology    PR ESOPHAGOSCP RIG TRANSORAL HYPOPHARYNX CRV ESOPH N/A 04/04/2018    Procedure: ESOPHAGOSCOPY, RIGID, TRANSORAL WITH DIVERTICULECTOMY OF HYPOPHARYNX OR CERVICAL ESOPHAGUS, WITH CRICOPHARYNGEAL MYOTOMY, INCLUDES USE OF TELESCOPE OR OPERATING MICROSCOPE AND REPAIR, WHEN PERFORMED;  Surgeon: Vista Deck, MD;  Location: MAIN OR [facility];  Service: ENT    PR INSER HEART TEMP PACER ONE CHMBR N/A 10/02/2016    Procedure: Tempoarary Pacemaker Insertion;  Surgeon: Meredith Leeds, MD;  Location: [facility];  Service: Cardiology    PR LAP,DIAGNOSTIC ABDOMEN N/A 01/22/2016    Procedure: Laparoscopy, Abdomen, Peritoneum, & Omentum, Diagnostic, W/Wo Collection Specimen(S) By Brushing Or Washing;  Surgeon: Particia Nearing, MD;  Location: MAIN OR [facility];  Service: Transplant    PR LARYNGOPLASTY MEDIALIZATION Neysa Bonito N/A 11/07/2018    Procedure: R31 LARYNGOPLASTY, MEDIALIZATION, UNILATERAL;  Surgeon: Vista Deck, MD;  Location: MAIN OR [facility];  Service: ENT    PR PLACE PERCUT GASTROSTOMY TUBE N/A 11/17/2016    Procedure: UGI ENDO; W/DIRECTED PLCMT PERQ GASTROSTOMY TUBE;  Surgeon: Cletis Athens, MD;  Location: GI PROC[facility]URES [facility];  Service: Gastroenterology    PR TRACHEOSTOMY, PLANNED N/A 09/29/2016    Procedure: TRACHEOSTOMY PLANNED (SEPART PROC);  Surgeon: Katherina Mires, MD;  Location: MAIN OR [facility];  Service: Trauma    PR TRANSCATH INSERT OR REPLACE LEADLESS PM VENTR N/A 10/11/2016    Procedure: Pacemaker Implant/Replace Leadless;  Surgeon: Meredith Leeds, MD;  Location: [facility] EP;  Service: Cardiology    PR TRANSPLANT LIVER,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    Procedure: Liver Allotransplantation; Orthotopic, Partial Or Whole, From Cadaver Or Living Donor, Any Age;  Surgeon: Doyce Loose, MD;  Location: MAIN OR [facility];  Service: Transplant    PR TRANSPLANT,PREP DONOR LIVER, WHOLE N/A 09/15/2016    Procedure: Rogelia Boga Std Prep Cad Donor Whole Liver Gft Prior Tnsplnt,Inc Chole,Diss/Rem Surr Tissu Wo Triseg/Lobe Splt;  Surgeon: Doyce Loose, MD;  Location: MAIN OR [facility];  Service:  Transplant    PR UPPER GI ENDOSCOPY,BIOPSY N/A 07/17/2012    Procedure: UGI ENDOSCOPY; WITH BIOPSY, SINGLE OR MULTIPLE;  Surgeon: Alba Destine, MD;  Location: [facility]URES [facility];  Service: Gastroenterology    PR UPPER GI ENDOSCOPY,DIAGNOSIS N/A 02/04/2014    Procedure: UGI ENDO, INCLUDE ESOPHAGUS, STOMACH, & DUODENUM &/OR JEJUNUM; DX W/WO COLLECTION SPECIMN, BY BRUSH OR WASH;  Surgeon: Wilburt Finlay, MD;  Location: GI PROCEDURES [facility];  Service: Gastroenterology    PR UPPER GI ENDOSCOPY,LIGAT VARIX N/A 11/05/2013    Procedure: UGI ENDO; Everlene Balls LIG ESOPH &/OR GASTRIC VARICES;  Surgeon: Wilburt Finlay, MD;  [facility];  Service: Gastroenterology       ALLERGIES:    has no known allergies.    MEDICATIONS:  Current Outpatient Medications   Medication Sig Dispense Refill    alendronate (FOSAMAX) 70 MG tablet Take 1 tablet every Sunday on empty stomach with a full glass of water. Do not lie down or eat/drink anything else for the next 30 min. 12 tablet 3    BASAGLAR KWIKPEN U-100 INSULIN 100 unit/mL (3 mL) injection pen Inject 0.14 mL (14 Units total) under the skin at bedtime. 15 mL 4    BASAGLAR KWIKPEN U-100 INSULIN 100 unit/mL (3 mL) injection pen Inject 14 Units subcutaneously at bedtime 15 mL 4    blood-glucose meter (GLUCOSE MONITORING KIT) kit Use as instructed 1 each 0    cholecalciferol, vitamin D3, 125 mcg (5,000 unit) tablet Take 1 tablet (125 mcg total) by mouth daily.      cycloSPORINE modified (NEORAL) 100 MG capsule Take 2 capsules (200 mg total) by mouth daily AND 1 capsule (100 mg total) nightly. 270 capsule 3    cycloSPORINE modified (NEORAL) 25 MG capsule Take 3 capsules (75 mg total) by mouth nightly (with a 100mg  capsule). For a total dose of 200 mg in the morning , 175mg  every evening 270 capsule 3    DULoxetine (CYMBALTA) 30 MG capsule Take 2 capsules (60 mg total) by mouth nightly. 60 capsule 11    insulin glargine (BASAGLAR, LANTUS) 100 unit/mL (3 mL) injection pen Inject 0.14 mL (14 Units total) under the skin two (2) times a day. 15 mL 3    insulin glargine (BASAGLAR, LANTUS) 100 unit/mL (3 mL) injection pen Inject 0.14 mL (14 Units total) under the skin at bedtime. 15 mL 2    melatonin 3 mg Tab Take 1 tablet (3 mg total) by mouth nightly. 30 tablet 0    metoprolol succinate (TOPROL-XL) 25 MG 24 hr tablet Take 1 tablet (25 mg total) by mouth.      mirtazapine (REMERON) 15 MG tablet Take 1 tablet (15 mg total) by mouth nightly. 90 tablet 3    mycophenolate (CELLCEPT) 250 mg capsule Take 2 capsules (500 mg total) by mouth two (2) times a day. 120 capsule 11    nortriptyline (PAMELOR) 10 MG capsule Take 1 capsule (10 mg total) by mouth nightly. 30 capsule 11    pen needle, diabetic (ULTICARE PEN NEEDLE) 32 gauge x 5/32 (4 mm) Ndle USE WITH INSULIN PEN 4 TIMES DAILY AS DIRECTED 100 each 1    pen needle, diabetic (ULTICARE PEN NEEDLE) 32 gauge x 5/32 (4 mm) Ndle Use with insulin twice a day 200 each 1    polyethylene glycol (GOLYTELY) 236-22.74-6.74 gram solution Take by mouth as directed per [facility] GI prep instructions, for split bowel prep. 4000 mL 0  rosuvastatin (CRESTOR) 5 MG tablet Take 1 tablet (5 mg total) by mouth daily. 90 tablet 3    sildenafiL, pulm.hypertension, (REVATIO) 20 mg tablet Take 2 tablets (40 mg total) by mouth Three (3) times a day. 180 tablet 6    tamsulosin (FLOMAX) 0.4 mg capsule Take 1 capsule (0.4 mg total) by mouth daily. 90 capsule 0     No current facility-administered medications for this visit.        SOCIAL HISTORY:  Social History     Tobacco Use    Smoking status: Never    Smokeless tobacco: Never    Tobacco comments:     Smoked in high school for about 5 years.    Substance Use Topics    Alcohol use: No    Drug use: No        FAMILY HISTORY:  Unknown for prostate cancer disease.  Family History   Problem Relation Age of Onset    Hypertension Mother     Cancer Mother     Heart disease Mother     Heart disease Father     Cirrhosis Neg Hx     Liver cancer Neg Hx     Anemia Neg Hx     Diabetes Neg Hx     Kidney disease Neg Hx     Obesity Neg Hx     Thyroid disease Neg Hx     Osteoporosis Neg Hx     Coronary artery disease Neg Hx     Anesthesia problems Neg Hx     Basal cell carcinoma Neg Hx     Squamous cell carcinoma Neg Hx        REVIEW OF SYSTEMS:  A 10-system review of systems was completed  by the patient and reviewed by me today.  All pertinent positives  were discussed in history of present illness.    PHYSICAL EXAMINATION:  VITAL SIGNS:  The patient is afebrile.  Vital signs are stable.  GENERAL:  In no apparent distress.  HEENT:  Eyes, ears and mouth appeared normal.  LYMPHATICS:  No cervical lymphadenopathy.  LUNGS:  Chest wall excursion symmetric bilaterally. No audible wheezing  ABDOMEN:  Soft, nontender, nondistended.  BACK:  Negative CVA tenderness.     DRE: (From prior visit) prostate mildly enlarged, smooth, soft no nodules  EXTREMITIES:  No lower extremity edema.  SKIN:  No rashes or jaundice.  NEUROLOGIC:  No focal deficits on neurologic exam.    LABORATORY TESTING:      Chemistry        Component Value Date/Time    NA 137 10/12/2022 1641    NA 141 09/26/2016 1640    K 4.8 10/12/2022 1641    K 4.1 09/26/2016 1640    CL 108 (H) 10/12/2022 1641    CL 106 09/25/2018 1715    CO2 23.1 10/12/2022 1641    CO2 29 10/10/2013 1417    BUN 26 (H) 10/12/2022 1641    BUN 34 (H) 09/25/2018 1715    CREATININE 1.71 (H) 10/12/2022 1641    CREATININE 1.0 09/25/2015 1042    GLU 273 (H) 10/12/2022 1641        Component Value Date/Time    CALCIUM 10.2 10/12/2022 1641    CALCIUM 9.3 10/10/2013 1417    ALKPHOS 97 10/12/2022 1641    ALKPHOS 214 (H) 01/09/2014 1029    AST 12 10/12/2022 1641    AST 46 01/09/2014 1029    ALT 10 10/12/2022 1641  ALT 41 01/09/2014 1029    BILITOT 0.4 10/12/2022 1641    BILITOT 0.5 12/12/2017 0830                  06/22/2021 AUA symptom score total of 35 with bother of 6 and a leakage of 4.  06/22/2021 postvoid residual 26 ml    02/15/2022 AUA SS 6 with bother of 1   02/15/2022 PVR 21 ml    02/21/2023 AUA SS ***  12/10 2024 PVR 33ml

## 2023-02-21 ENCOUNTER — Ambulatory Visit: Admit: 2023-02-21 | Discharge: 2023-02-21 | Payer: MEDICARE

## 2023-02-21 ENCOUNTER — Ambulatory Visit: Admit: 2023-02-21 | Discharge: 2023-02-21 | Payer: MEDICARE | Attending: Urology | Primary: Urology

## 2023-02-21 DIAGNOSIS — Z79899 Other long term (current) drug therapy: Principal | ICD-10-CM

## 2023-02-21 DIAGNOSIS — Z944 Liver transplant status: Principal | ICD-10-CM

## 2023-02-21 DIAGNOSIS — N400 Enlarged prostate without lower urinary tract symptoms: Principal | ICD-10-CM

## 2023-02-21 LAB — CBC W/ AUTO DIFF
BASOPHILS ABSOLUTE COUNT: 0.1 10*9/L (ref 0.0–0.1)
BASOPHILS RELATIVE PERCENT: 1.3 %
EOSINOPHILS ABSOLUTE COUNT: 0.1 10*9/L (ref 0.0–0.5)
EOSINOPHILS RELATIVE PERCENT: 1.9 %
HEMATOCRIT: 38.7 % — ABNORMAL LOW (ref 39.0–48.0)
HEMOGLOBIN: 13.7 g/dL (ref 12.9–16.5)
LYMPHOCYTES ABSOLUTE COUNT: 1.3 10*9/L (ref 1.1–3.6)
LYMPHOCYTES RELATIVE PERCENT: 21.7 %
MEAN CORPUSCULAR HEMOGLOBIN CONC: 35.3 g/dL (ref 32.0–36.0)
MEAN CORPUSCULAR HEMOGLOBIN: 31.9 pg (ref 25.9–32.4)
MEAN CORPUSCULAR VOLUME: 90.5 fL (ref 77.6–95.7)
MEAN PLATELET VOLUME: 9.3 fL (ref 6.8–10.7)
MONOCYTES ABSOLUTE COUNT: 0.4 10*9/L (ref 0.3–0.8)
MONOCYTES RELATIVE PERCENT: 6.7 %
NEUTROPHILS ABSOLUTE COUNT: 4.2 10*9/L (ref 1.8–7.8)
NEUTROPHILS RELATIVE PERCENT: 68.4 %
PLATELET COUNT: 125 10*9/L — ABNORMAL LOW (ref 150–450)
RED BLOOD CELL COUNT: 4.28 10*12/L (ref 4.26–5.60)
RED CELL DISTRIBUTION WIDTH: 13.4 % (ref 12.2–15.2)
WBC ADJUSTED: 6.1 10*9/L (ref 3.6–11.2)

## 2023-02-21 LAB — COMPREHENSIVE METABOLIC PANEL
ALBUMIN: 3.7 g/dL (ref 3.4–5.0)
ALKALINE PHOSPHATASE: 89 U/L (ref 46–116)
ALT (SGPT): 14 U/L (ref 10–49)
ANION GAP: 13 mmol/L (ref 5–14)
AST (SGOT): 12 U/L (ref <=34)
BILIRUBIN TOTAL: 0.5 mg/dL (ref 0.3–1.2)
BLOOD UREA NITROGEN: 27 mg/dL — ABNORMAL HIGH (ref 9–23)
BUN / CREAT RATIO: 17
CALCIUM: 10.1 mg/dL (ref 8.7–10.4)
CHLORIDE: 100 mmol/L (ref 98–107)
CO2: 23.1 mmol/L (ref 20.0–31.0)
CREATININE: 1.63 mg/dL — ABNORMAL HIGH (ref 0.73–1.18)
EGFR CKD-EPI (2021) MALE: 44 mL/min/{1.73_m2} — ABNORMAL LOW (ref >=60)
GLUCOSE RANDOM: 393 mg/dL — ABNORMAL HIGH (ref 70–179)
POTASSIUM: 4 mmol/L (ref 3.4–4.8)
PROTEIN TOTAL: 6.8 g/dL (ref 5.7–8.2)
SODIUM: 136 mmol/L (ref 135–145)

## 2023-02-21 LAB — PHOSPHORUS: PHOSPHORUS: 2 mg/dL — ABNORMAL LOW (ref 2.4–5.1)

## 2023-02-21 LAB — MAGNESIUM: MAGNESIUM: 1.9 mg/dL (ref 1.6–2.6)

## 2023-02-21 LAB — GAMMA GT: GAMMA GLUTAMYL TRANSFERASE: 52 U/L (ref 0–73)

## 2023-02-21 LAB — PSA: PROSTATE SPECIFIC ANTIGEN: 3.01 ng/mL (ref 0.00–4.00)

## 2023-02-21 LAB — BILIRUBIN, DIRECT: BILIRUBIN DIRECT: 0.1 mg/dL (ref 0.00–0.30)

## 2023-02-21 NOTE — Unmapped (Addendum)
Reviewed urinary symptoms and postvoid residual in clinic today  Discussed behavioral changes recommendations including:  1.  Timed voiding every 2-3 hours by the clock and not on sensation alone  2.  Double voiding-trying to urinate again shortly after going to see if you can empty more  3.  Continue good bowel regimen to minimize constipation episodes  4.  Shift fluid intake earlier in the day and less in the evening hours and at night to decrease nighttime voiding  5.  Minimizing high diuretic type fluids such as tea, soda, alcohol, coffee which will increase urination frequency  Continue tamsulosin medication  Reviewed importance of good diabetes and sugar control for urinary complaints  Discussed prior PSA result 6 years ago and utility of repeat PSA testing; PSA today  Return to clinic in 12 to 15 months for follow-up

## 2023-02-21 NOTE — Unmapped (Signed)
 ANALYSIS AND PLAN:  This is a 72 y.o. 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 I reviewed with the patient today his urinary complaints, symptoms and post-void residual. His PVR is 33 ml and IPSS 19(4). The patient does note some worsening of his symptoms but states he is still not interested in more invasive treatment at this point.  I reviewed the patients response to Flomax medication. He should continue current medication therapy. Discussed his prior PSA results from 6 years ago. We reviewed the utility of repeat PSA testing today in clinic, will obtain PSA today. I discussed contributing factor of poorly controlled diabetes to worsened, progressive lower urinary tract symptoms and erectile dysfunction. We discussed the importance of behavioral modifications including timed and double voiding, avoiding constipation episodes, shifting fluid intake earlier in the day to decrease nocturia at night.     The patient will follow-up in approximately 12 months to reassess symptoms. We will be happy to see him back anytime sooner if symptoms arise.    ADDENDUM:      Will reassess at annual visit    [name], MD  [address]  [facility] [place],  [place] [address]    CHIEF COMPLAINT: History of BPH and bothersome lower urinary tract symptoms here for follow-up    BRIEF HISTORY OF PRESENT ILLNESS:  Jeffrey Ward is very pleasant 72 y.o. male who returns for the evaluation of bothersome lower urinary tract symptoms and BPH. The patient notes urinary complaints for a prolonged amount of time. He has previously been maintained on medication therapy including Tamsulosin 0.4 mg at one point. His primary urinary complaints include intermittency, frequency/urgency, weak urinary stream, and nocturia 8-10x that have worsened over the years. He feels incomplete bladder emptying with small voids. He has severe urge incontinence. Urinary symptoms started since he injured his back in 2020.  The patient reports a history of pyelonephritis and is not on any medical treatment for this. He is followed by nephrology. Patient denies history of nephrolithiasis. He also complains of erectile dysfunction. Since he was last seen, the patient notes a decline in his urinary symptoms within the last 6 months. His most bothersome urinary complaints are weak stream and urgency. He also reports sensation of incomplete emptying, frequency and nocturia 3x. He is not interested in surgical intervention. The patient denies a history of dysuria, hematuria or urinary tract infection. He has never had a prostate biopsy in the past. He has no family history of prostate cancer. He does not have a history of sleep apnea or significant snoring. The patient has no history of fevers, chills, nausea, vomiting, chest pain or shortness of breath.     PAST MEDICAL HISTORY:     Past Medical History:   Diagnosis Date    Atrial fibrillation (CMS-HCC)     Basal cell carcinoma     Cancer (CMS-HCC)     Cirrhosis (CMS-HCC)     Depression     Diabetes (CMS-HCC)     Headache     Hepatitis C 07/17/2012    History of transfusion     Liver disease     Low back pain 07/17/2012    Varices,  esophageal (CMS-HCC)        PAST SURGICAL HISTORY:   Past Surgical History:   Procedure Laterality Date    ANKLE SURGERY      BACK SURGERY      BACK SURGERY      CARDIAC SURGERY      pacemaker    CHG US GUIDE, TISSUE ABLATION N/A 01/22/2016    Procedure: ULTRASOUND GUIDANCE FOR, AND MONITORING OF, PARENCHYMAL TISSUE ABLATION;  Surgeon: Particia Nearing, MD;  Location: MAIN OR [facility];  Service: Transplant    IR EMBOLIZATION ORGAN ISCHEMIA, TUMORS, INFAR  06/16/2016    IR EMBOLIZATION ORGAN ISCHEMIA, TUMORS, INFAR 06/16/2016 Ammie Dalton, MD IMG VIR [facility]    PR COLON CA SCRN NOT HI RSK IND N/A 02/27/2015    Procedure: COLOREC CNCR SCR;COLNSCPY NO;  Surgeon: Vonda Antigua, MD;  Location: [facility]URES [facility];  Service: Gastroenterology    PR COLONOSCOPY FLX DX W/COLLJ SPEC WHEN PFRMD N/A 02/06/2023    Procedure: COLONOSCOPY, FLEXIBLE, PROXIMAL TO SPLENIC FLEXURE; DIAGNOSTIC, W/WO COLLECTION SPECIMEN BY BRUSH OR WASH;  Surgeon: Stephannie Li, MD;  Location: GI PROCEDURES [facility];  Service: Gastroenterology    PR ENDOSCOPIC ULTRASOUND EXAM N/A 02/27/2015    Procedure: UGI ENDO; W/ENDO ULTRASOUND EXAM INCLUDES ESOPHAGUS, STOMACH, &/OR DUODENUM/JEJUNUM;  Surgeon: Vonda Antigua, MD;  Location: GI PROCEDURES [facility];  Service: Gastroenterology    PR ERCP BALLOON DILATE BILIARY/PANC DUCT/AMPULLA EA N/A 02/10/2017    Procedure: ERCP;WITH TRANS-ENDOSCOPIC BALLOON DILATION OF BILIARY/PANCREATIC DUCT(S) OR OF AMPULLA, INCLUDING SPHINCTERECTOMY, WHEN PERFOREMD,EACH DUCT ([id number]);  Surgeon: Mayford Knife, MD;  Location: GI PROCEDURES [facility];  Service: Gastroenterology    PR ERCP REMOVE FOREIGN BODY/STENT BILIARY/PANC DUCT N/A 02/10/2017    Procedure: ENDOSCOPIC RETROGRADE CHOLANGIOPANCREATOGRAPHY (ERCP); W/ REMOVAL OF FOREIGN BODY/STENT FROM BILIARY/PANCREATIC DUCT(S);  Surgeon: Mayford Knife, MD;  Location: [facility] PROCEDURES [facility];  Service: Gastroenterology    PR ERCP REMOVE FOREIGN BODY/STENT BILIARY/PANC DUCT N/A 06/29/2017    Procedure: ENDOSCOPIC RETROGRADE CHOLANGIOPANCREATOGRAPHY (ERCP); W/ REMOVAL OF FOREIGN BODY/STENT FROM BILIARY/PANCREATIC DUCT(S);  Surgeon: Vonda Antigua, MD;  Location: GI PROCEDURES [facility];  Service: Gastroenterology    PR ERCP STENT PLACEMENT BILIARY/PANCREATIC DUCT N/A 11/29/2016    Procedure: ENDOSCOPIC RETROGRADE CHOLANGIOPANCREATOGRAPHY (ERCP); WITH PLACEMENT OF ENDOSCOPIC STENT INTO BILIARY OR PANCREATIC DUCT;  Surgeon: Chriss Driver, MD;  Location: GI PROCEDURES [facility];  Service: Gastroenterology    PR ERCP STENT PLACEMENT BILIARY/PANCREATIC DUCT N/A 04/26/2017    Procedure: ENDOSCOPIC RETROGRADE CHOLANGIOPANCREATOGRAPHY (ERCP); WITH PLACEMENT OF ENDOSCOPIC STENT INTO BILIARY OR PANCREATIC DUCT;  Surgeon: Mayford Knife, MD;  Location: GI PROCEDURES [facility];  Service: Gastroenterology    PR ERCP,W/REMOVAL STONE,BIL/PANCR DUCTS N/A 11/29/2016    Procedure: ERCP; W/ENDOSCOPIC RETROGRADE REMOVAL OF CALCULUS/CALCULI FROM BILIARY &/OR PANCREATIC DUCTS;  Surgeon: Chriss Driver, MD;  Location: GI PROCEDURES [facility];  Service: Gastroenterology    PR ERCP,W/REMOVAL STONE,BIL/PANCR DUCTS N/A 02/10/2017    Procedure: ERCP; W/ENDOSCOPIC RETROGRADE REMOVAL OF CALCULUS/CALCULI FROM BILIARY &/OR PANCREATIC DUCTS;  Surgeon: Mayford Knife, MD;  Location: GI PROCEDURES [facility];  Service: Gastroenterology    PR ERCP,W/REMOVAL STONE,BIL/PANCR DUCTS N/A 04/26/2017    Procedure: ERCP; W/ENDOSCOPIC RETROGRADE REMOVAL OF CALCULUS/CALCULI FROM BILIARY &/OR PANCREATIC DUCTS;  Surgeon: Mayford Knife, MD;  Location: GI PROCEDURES [facility];  Service: Gastroenterology    PR ERCP,W/REMOVAL STONE,BIL/PANCR DUCTS N/A 06/29/2017    Procedure: ERCP; W/ENDOSCOPIC RETROGRADE REMOVAL OF CALCULUS/CALCULI FROM BILIARY &/OR PANCREATIC DUCTS;  Surgeon: Vonda Antigua, MD;  Location: GI PROCEDURES [facility];  Service: Gastroenterology    PR ESOPHAGOSCP RIG TRANSORAL HYPOPHARYNX CRV ESOPH N/A 04/04/2018    Procedure: ESOPHAGOSCOPY, RIGID, TRANSORAL WITH DIVERTICULECTOMY OF HYPOPHARYNX OR CERVICAL ESOPHAGUS, WITH CRICOPHARYNGEAL MYOTOMY, INCLUDES USE OF TELESCOPE OR OPERATING MICROSCOPE AND REPAIR, WHEN PERFORMED;  Surgeon: Vista Deck, MD;  Location: MAIN OR [facility];  Service: ENT    PR INSER HEART TEMP PACER ONE CHMBR N/A 10/02/2016    Procedure: Tempoarary Pacemaker Insertion;  Surgeon: Meredith Leeds, MD;  Location: [facility];  Service: Cardiology    PR LAP,DIAGNOSTIC ABDOMEN N/A 01/22/2016    Procedure: Laparoscopy, Abdomen, Peritoneum, & Omentum, Diagnostic, W/Wo Collection Specimen(S) By Brushing Or Washing;  Surgeon: Particia Nearing, MD;  Location: MAIN OR [facility];  Service: Transplant    PR LARYNGOPLASTY MEDIALIZATION Neysa Bonito N/A 11/07/2018    Procedure: R31 LARYNGOPLASTY, MEDIALIZATION, UNILATERAL;  Surgeon: Vista Deck, MD;  Location: MAIN OR [facility];  Service: ENT    PR PLACE PERCUT GASTROSTOMY TUBE N/A 11/17/2016    Procedure: UGI ENDO; W/DIRECTED PLCMT PERQ GASTROSTOMY TUBE;  Surgeon: Cletis Athens, MD;  Location: GI PROCEDURES [facility];  Service: Gastroenterology    PR TRACHEOSTOMY, PLANNED N/A 09/29/2016    Procedure: TRACHEOSTOMY PLANNED (SEPART PROC);  Surgeon: Katherina Mires, MD;  Location: MAIN OR [facility];  Service: Trauma    PR TRANSCATH INSERT OR REPLACE LEADLESS PM VENTR N/A 10/11/2016    Procedure: Pacemaker Implant/Replace Leadless;  Surgeon: Meredith Leeds, MD;  Location: [facility] EP;  Service: Cardiology    PR TRANSPLANT LIVER,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    Procedure: Liver Allotransplantation; Orthotopic, Partial Or Whole, From Cadaver Or Living Donor, Any Age;  Surgeon: Doyce Loose, MD;  Location: MAIN OR [facility];  Service: Transplant    PR TRANSPLANT,PREP DONOR LIVER, WHOLE N/A 09/15/2016    Procedure: Rogelia Boga Std Prep Cad Donor Whole Liver Gft Prior Tnsplnt,Inc Chole,Diss/Rem Surr Tissu Wo Triseg/Lobe Splt;  Surgeon: Doyce Loose, MD;  Location: MAIN OR [facility];  Service: Transplant    PR UPPER GI ENDOSCOPY,BIOPSY N/A 07/17/2012    Procedure: UGI ENDOSCOPY; WITH BIOPSY, SINGLE OR MULTIPLE;  Surgeon: Alba Destine, MD;  Location: [facility]URES [facility];  Service: Gastroenterology    PR UPPER GI ENDOSCOPY,DIAGNOSIS N/A 02/04/2014    Procedure: UGI ENDO, INCLUDE ESOPHAGUS, STOMACH, & DUODENUM &/OR JEJUNUM; DX W/WO COLLECTION SPECIMN, BY BRUSH OR WASH;  Surgeon: Wilburt Finlay, MD;  Location: GI PROCEDURES [facility];  Service: Gastroenterology    PR UPPER GI ENDOSCOPY,LIGAT VARIX N/A 11/05/2013    Procedure: UGI ENDO; Everlene Balls LIG ESOPH &/OR GASTRIC VARICES;  Surgeon: Wilburt Finlay, MD;  Location: [facility] PROCEDURES [facility];  Service: Gastroenterology       ALLERGIES:    has no known allergies.    MEDICATIONS:  Current Outpatient Medications   Medication Sig Dispense Refill    alendronate (FOSAMAX) 70 MG tablet Take 1 tablet every Sunday on empty stomach with a full glass of water. Do not lie down or eat/drink anything else for the next 30 min. 12 tablet 3    BASAGLAR KWIKPEN U-100 INSULIN 100 unit/mL (3 mL) injection pen Inject 0.14 mL (14 Units total) under the skin at bedtime. 15 mL 4    BASAGLAR KWIKPEN U-100 INSULIN 100 unit/mL (3 mL) injection pen Inject 14 Units subcutaneously at bedtime 15 mL 4    blood-glucose meter (GLUCOSE MONITORING KIT) kit Use as instructed 1 each 0  cholecalciferol, vitamin D3, 125 mcg (5,000 unit) tablet Take 1 tablet (125 mcg total) by mouth daily.      cycloSPORINE modified (NEORAL) 100 MG capsule Take 2 capsules (200 mg total) by mouth daily AND 1 capsule (100 mg total) nightly. 270 capsule 3    cycloSPORINE modified (NEORAL) 25 MG capsule Take 3 capsules (75 mg total) by mouth nightly (with a 100mg  capsule). For a total dose of 200 mg in the morning , 175mg  every evening 270 capsule 3    DULoxetine (CYMBALTA) 30 MG capsule Take 2 capsules (60 mg total) by mouth nightly. 60 capsule 11    insulin glargine (BASAGLAR, LANTUS) 100 unit/mL (3 mL) injection pen Inject 0.14 mL (14 Units total) under the skin two (2) times a day. 15 mL 3    insulin glargine (BASAGLAR, LANTUS) 100 unit/mL (3 mL) injection pen Inject 0.14 mL (14 Units total) under the skin at bedtime. 15 mL 2    melatonin 3 mg Tab Take 1 tablet (3 mg total) by mouth nightly. 30 tablet 0    metoprolol succinate (TOPROL-XL) 25 MG 24 hr tablet Take 1 tablet (25 mg total) by mouth.      mirtazapine (REMERON) 15 MG tablet Take 1 tablet (15 mg total) by mouth nightly. 90 tablet 3    mycophenolate (CELLCEPT) 250 mg capsule Take 2 capsules (500 mg total) by mouth two (2) times a day. 120 capsule 11    nortriptyline (PAMELOR) 10 MG capsule Take 1 capsule (10 mg total) by mouth nightly. 30 capsule 11    pen needle, diabetic (ULTICARE PEN NEEDLE) 32 gauge x 5/32 (4 mm) Ndle USE WITH INSULIN PEN 4 TIMES DAILY AS DIRECTED 100 each 1    pen needle, diabetic (ULTICARE PEN NEEDLE) 32 gauge x 5/32 (4 mm) Ndle Use with insulin twice a day 200 each 1    polyethylene glycol (GOLYTELY) 236-22.74-6.74 gram solution Take by mouth as directed per [facility] GI prep instructions, for split bowel prep. 4000 mL 0    rosuvastatin (CRESTOR) 5 MG tablet Take 1 tablet (5 mg total) by mouth daily. 90 tablet 3    sildenafiL, pulm.hypertension, (REVATIO) 20 mg tablet Take 2 tablets (40 mg total) by mouth Three (3) times a day. 180 tablet 6    tamsulosin (FLOMAX) 0.4 mg capsule Take 1 capsule (0.4 mg total) by mouth daily. 90 capsule 0     No current facility-administered medications for this visit.        SOCIAL HISTORY:  Social History     Tobacco Use    Smoking status: Never    Smokeless tobacco: Never    Tobacco comments:     Smoked in high school for about 5 years.    Substance Use Topics    Alcohol use: No    Drug use: No        FAMILY HISTORY:  Unknown for prostate cancer disease.  Family History   Problem Relation Age of Onset    Hypertension Mother     Cancer Mother     Heart disease Mother     Heart disease Father     Cirrhosis Neg Hx     Liver cancer Neg Hx     Anemia Neg Hx     Diabetes Neg Hx     Kidney disease Neg Hx     Obesity Neg Hx     Thyroid disease Neg Hx     Osteoporosis Neg Hx  Coronary artery disease Neg Hx     Anesthesia problems Neg Hx     Basal cell carcinoma Neg Hx     Squamous cell carcinoma Neg Hx        REVIEW OF SYSTEMS:  A 10-system review of systems was completed  by the patient and reviewed by me today.  All pertinent positives  were discussed in history of present illness.    PHYSICAL EXAMINATION:  VITAL SIGNS:  The patient is afebrile.  Vital signs are stable.  GENERAL:  In no apparent distress.  HEENT:  Eyes, ears and mouth appeared normal.  LYMPHATICS:  No cervical lymphadenopathy.  LUNGS:  Chest wall excursion symmetric bilaterally. No audible wheezing     DRE: (From prior visit) prostate mildly enlarged, smooth, soft no nodules  EXTREMITIES:  No lower extremity edema.  SKIN:  No rashes or jaundice.  NEUROLOGIC:  No focal deficits on neurologic exam.    LABORATORY TESTING:      Chemistry        Component Value Date/Time    NA 137 10/12/2022 1641    NA 141 09/26/2016 1640    K 4.8 10/12/2022 1641    K 4.1 09/26/2016 1640    CL 108 (H) 10/12/2022 1641    CL 106 09/25/2018 1715    CO2 23.1 10/12/2022 1641    CO2 29 10/10/2013 1417    BUN 26 (H) 10/12/2022 1641    BUN 34 (H) 09/25/2018 1715    CREATININE 1.71 (H) 10/12/2022 1641    CREATININE 1.0 09/25/2015 1042    GLU 273 (H) 10/12/2022 1641        Component Value Date/Time    CALCIUM 10.2 10/12/2022 1641    CALCIUM 9.3 10/10/2013 1417    ALKPHOS 97 10/12/2022 1641    ALKPHOS 214 (H) 01/09/2014 1029    AST 12 10/12/2022 1641    AST 46 01/09/2014 1029    ALT 10 10/12/2022 1641    ALT 41 01/09/2014 1029    BILITOT 0.4 10/12/2022 1641    BILITOT 0.5 12/12/2017 0830                  06/22/2021 AUA symptom score total of 35 with bother of 6 and a leakage of 4.  06/22/2021 postvoid residual 26 ml    02/15/2022 AUA SS 6 with bother of 1   02/15/2022 PVR 21 ml    02/21/2023 AUA SS 19 with bother of 4   12/10 2024 PVR 33 ml     Scribe's Attestation: Macy Mis, MD obtained and performed the history, physical exam and medical decision making elements that were entered into the chart.  Signed by Vergie Living, Scribe, on February 21, 2023 at 1:32 PM.    ----------------------------------------------------------------------------------------------------------------------  February 22, 2023 8:33 AM. Documentation assistance provided by the Scribe. I was present during the time the encounter was recorded. The information recorded by the Scribe was done at my direction and has been reviewed and validated by me.    Macy Mis MD  ----------------------------------------------------------------------------------------------------------------------

## 2023-02-22 LAB — CYCLOSPORINE LEVEL: CYCLOSPORINE (FPIA) BLOOD: <10 ng/mL

## 2023-02-22 NOTE — Unmapped (Signed)
Lab results from 02/21/23 were reviewed by Vallery Sa ANP as seen in Epic. Pt's CsA is <10, LFTs stable.     Messaged Pt in MyChart regarding taking CsA.    Messaged ANP [employer].

## 2023-02-23 NOTE — Unmapped (Signed)
Lab results from 02/21/23 were reviewed with [facility] ANP. Pt's CsA for the third time is less than 10. Messaged ANP Metheny for awareness.    Message Pt in MyChart, no response.    Placed call to Pt, no answer left voicemail asking for Pt to return call or MyChart message if he is still taking CsA.     Liver labs stable.

## 2023-02-24 NOTE — Unmapped (Signed)
12/12 - Contacted pt to schedule for liver txp '25 annual pt okay with in person appt with his request for afternoon slot, pt okay with appt letter by mail and pt verbalized understanding all discussed.

## 2023-04-04 NOTE — Unmapped (Signed)
[facility] Refill Coordination Note    Specialty Medication(s) to be Shipped:   Transplant: mycophenolate mofetil 250mg , cyclosporine 25mg , and cyclosporine 100mg     Other medication(s) to be shipped: No additional medications requested for fill at this time     Jeffrey Ward, DOB: 07-May-1950  Phone: [phone] (home)       All above HIPAA information was verified with patient.     Was a Nurse, learning disability used for this call? No    Completed refill call assessment today to schedule patient's medication shipment from the [facility]  [phone]).  All relevant notes have been reviewed.     Specialty medication(s) and dose(s) confirmed: Regimen is correct and unchanged.   Changes to medications: Jeffrey Ward reports no changes at this time.  Changes to insurance: No  New side effects reported not previously addressed with a pharmacist or physician: None reported  Questions for the pharmacist: No    Confirmed patient received a Conservation officer, historic buildings and a Surveyor, mining with first shipment. The patient will receive a drug information handout for each medication shipped and additional FDA Medication Guides as required.       DISEASE/MEDICATION-SPECIFIC INFORMATION        N/A    SPECIALTY MEDICATION ADHERENCE     Medication Adherence    Patient reported X missed doses in the last month: 0  Specialty Medication: mycophenolate 250 mg capsule (CELLCEPT)  Patient is on additional specialty medications: Yes  Additional Specialty Medications: cycloSPORINE modified 100 MG capsule (Neoral)  Patient Reported Additional Medication X Missed Doses in the Last Month: 0  Patient is on more than two specialty medications: Yes  Specialty Medication: cycloSPORINE modified 25 MG capsule (Neoral)  Patient Reported Additional Medication X Missed Doses in the Last Month: 0  Support network for adherence: family member              Were doses missed due to medication being on hold? No     mycophenolate 250 mg capsule (CELLCEPT): 3 days of medicine on hand    cycloSPORINE modified 100 MG capsule (Neoral): 3 days of medicine on hand    cycloSPORINE modified 25 MG capsule (Neoral): 3 days of medicine on hand       REFERRAL TO PHARMACIST     Referral to the pharmacist: Not needed      [facility]     Shipping address confirmed in Epic.       Delivery Scheduled: Yes, Expected medication delivery date: 04/05/23.     Medication will be delivered via Same Day Courier to the prescription address in Epic WAM.    Jeffrey Ward   [facility]  Specialty Technician

## 2023-04-05 NOTE — Unmapped (Signed)
Jeffrey Ward 's entire shipment will be rescheduled as a result of delivery delay due to weather event.     I have spoken with the patient  at [phone]  and communicated the delivery change. We will reschedule the medication for the delivery date that the patient agreed upon.  We have confirmed the delivery date as 04/06/2023, via same day courier.

## 2023-04-06 MED FILL — CYCLOSPORINE MODIFIED 25 MG CAPSULE: ORAL | 30 days supply | Qty: 90 | Fill #4

## 2023-04-06 MED FILL — CYCLOSPORINE MODIFIED 100 MG CAPSULE: ORAL | 30 days supply | Qty: 90 | Fill #4

## 2023-04-06 MED FILL — MYCOPHENOLATE MOFETIL 250 MG CAPSULE: ORAL | 30 days supply | Qty: 120 | Fill #1

## 2023-04-13 MED ORDER — ROSUVASTATIN 10 MG TABLET
ORAL_TABLET | Freq: Every day | ORAL | 3 refills | 90.00000 days
Start: 2023-04-13 — End: ?

## 2023-04-29 DIAGNOSIS — D849 Immunodeficiency, unspecified: Principal | ICD-10-CM

## 2023-04-29 DIAGNOSIS — Z944 Liver transplant status: Principal | ICD-10-CM

## 2023-04-29 MED ORDER — CYCLOSPORINE MODIFIED 25 MG CAPSULE
ORAL_CAPSULE | ORAL | 3 refills | 0.00 days
Start: 2023-04-29 — End: 2024-04-28

## 2023-04-29 MED ORDER — CYCLOSPORINE MODIFIED 100 MG CAPSULE
ORAL_CAPSULE | ORAL | 3 refills | 90.00 days
Start: 2023-04-29 — End: 2024-04-28

## 2023-05-01 DIAGNOSIS — D849 Immunodeficiency, unspecified: Principal | ICD-10-CM

## 2023-05-01 DIAGNOSIS — Z944 Liver transplant status: Principal | ICD-10-CM

## 2023-05-01 MED ORDER — CYCLOSPORINE MODIFIED 100 MG CAPSULE
ORAL_CAPSULE | ORAL | 3 refills | 90.00 days | Status: CP
Start: 2023-05-01 — End: 2024-04-30
  Filled 2023-05-25: qty 90, 30d supply, fill #0

## 2023-05-01 MED ORDER — CYCLOSPORINE MODIFIED 25 MG CAPSULE
ORAL_CAPSULE | Freq: Every day | ORAL | 11 refills | 30.00 days | Status: CP
Start: 2023-05-01 — End: 2024-04-30

## 2023-05-01 MED ORDER — LANTUS SOLOSTAR U-100 INSULIN 100 UNIT/ML (3 ML) SUBCUTANEOUS PEN
Freq: Every evening | SUBCUTANEOUS | 2 refills | 107.00000 days
Start: 2023-05-01 — End: ?

## 2023-05-01 NOTE — Unmapped (Signed)
 Received request for refill of CsA, placed order to [facility].

## 2023-05-01 NOTE — Unmapped (Signed)
 The patient is requesting a medication refill

## 2023-05-16 MED ORDER — LANTUS SOLOSTAR U-100 INSULIN 100 UNIT/ML (3 ML) SUBCUTANEOUS PEN
Freq: Every evening | SUBCUTANEOUS | 2 refills | 121.00 days
Start: 2023-05-16 — End: ?

## 2023-05-16 MED ORDER — BASAGLAR KWIKPEN U-100 INSULIN 100 UNIT/ML (3 ML) SUBCUTANEOUS
SUBCUTANEOUS | 3 refills | 0.00 days
Start: 2023-05-16 — End: ?

## 2023-05-17 MED ORDER — LANTUS SOLOSTAR U-100 INSULIN 100 UNIT/ML (3 ML) SUBCUTANEOUS PEN
Freq: Every evening | SUBCUTANEOUS | 3 refills | 90.00 days
Start: 2023-05-17 — End: ?

## 2023-05-18 MED FILL — LANTUS SOLOSTAR U-100 INSULIN 100 UNIT/ML (3 ML) SUBCUTANEOUS PEN: SUBCUTANEOUS | 88 days supply | Qty: 15 | Fill #0

## 2023-05-19 NOTE — Unmapped (Addendum)
 [place] Annual Post Liver Transplant Follow-Up     Transplant Date: 09/15/2016 (Liver)     Called pt to prep for scheduled post liver txp follow up with NP Metheny on Monday 05/22/2023 at [facility] clinic.    No answer, left vm to return call.    Routing to ANP Metheny for awareness.    Edit after clinic visit:    Per ANP Metheny's clinic visit note, Pt declines all vaccines. ANP Metheny prescribed mirtazapine for sleep aid.    Placed annual standing orders for labs to [facility].  Meds ordered.  Request placed on checklist for TPA Mahalia Longest to schedule an annual appointment with ANP Metheny at [facility] in one year.    Lab results from 05/22/23 were reviewed by Vallery Sa ANP. No changes recommended at this time. Pt to repeat labs in 3 months.

## 2023-05-22 ENCOUNTER — Ambulatory Visit: Admit: 2023-05-22 | Discharge: 2023-05-23 | Payer: MEDICARE

## 2023-05-22 DIAGNOSIS — Z79899 Other long term (current) drug therapy: Principal | ICD-10-CM

## 2023-05-22 DIAGNOSIS — Z72820 Sleep deprivation: Principal | ICD-10-CM

## 2023-05-22 DIAGNOSIS — Z944 Liver transplant status: Principal | ICD-10-CM

## 2023-05-22 LAB — BILIRUBIN, DIRECT: BILIRUBIN DIRECT: 0.1 mg/dL (ref 0.00–0.30)

## 2023-05-22 LAB — COMPREHENSIVE METABOLIC PANEL
ALBUMIN: 3.7 g/dL (ref 3.4–5.0)
ALKALINE PHOSPHATASE: 88 U/L (ref 46–116)
ALT (SGPT): 9 U/L — ABNORMAL LOW (ref 10–49)
ANION GAP: 10 mmol/L (ref 5–14)
AST (SGOT): 11 U/L (ref <=34)
BILIRUBIN TOTAL: 0.4 mg/dL (ref 0.3–1.2)
BLOOD UREA NITROGEN: 26 mg/dL — ABNORMAL HIGH (ref 9–23)
BUN / CREAT RATIO: 15
CALCIUM: 9.8 mg/dL (ref 8.7–10.4)
CHLORIDE: 103 mmol/L (ref 98–107)
CO2: 26.6 mmol/L (ref 20.0–31.0)
CREATININE: 1.7 mg/dL — ABNORMAL HIGH (ref 0.73–1.18)
EGFR CKD-EPI (2021) MALE: 42 mL/min/{1.73_m2} — ABNORMAL LOW (ref >=60)
GLUCOSE RANDOM: 256 mg/dL — ABNORMAL HIGH (ref 70–179)
POTASSIUM: 4.2 mmol/L (ref 3.4–4.8)
PROTEIN TOTAL: 6.6 g/dL (ref 5.7–8.2)
SODIUM: 140 mmol/L (ref 135–145)

## 2023-05-22 LAB — CBC W/ AUTO DIFF
BASOPHILS ABSOLUTE COUNT: 0 10*9/L (ref 0.0–0.1)
BASOPHILS RELATIVE PERCENT: 0.6 %
EOSINOPHILS ABSOLUTE COUNT: 0 10*9/L (ref 0.0–0.5)
EOSINOPHILS RELATIVE PERCENT: 0.6 %
HEMATOCRIT: 40.5 % (ref 39.0–48.0)
HEMOGLOBIN: 14.3 g/dL (ref 12.9–16.5)
LYMPHOCYTES ABSOLUTE COUNT: 1.1 10*9/L (ref 1.1–3.6)
LYMPHOCYTES RELATIVE PERCENT: 19.6 %
MEAN CORPUSCULAR HEMOGLOBIN CONC: 35.3 g/dL (ref 32.0–36.0)
MEAN CORPUSCULAR HEMOGLOBIN: 31.6 pg (ref 25.9–32.4)
MEAN CORPUSCULAR VOLUME: 89.6 fL (ref 77.6–95.7)
MEAN PLATELET VOLUME: 8.6 fL (ref 6.8–10.7)
MONOCYTES ABSOLUTE COUNT: 0.4 10*9/L (ref 0.3–0.8)
MONOCYTES RELATIVE PERCENT: 6.8 %
NEUTROPHILS ABSOLUTE COUNT: 4 10*9/L (ref 1.8–7.8)
NEUTROPHILS RELATIVE PERCENT: 72.4 %
PLATELET COUNT: 120 10*9/L — ABNORMAL LOW (ref 150–450)
RED BLOOD CELL COUNT: 4.52 10*12/L (ref 4.26–5.60)
RED CELL DISTRIBUTION WIDTH: 12.9 % (ref 12.2–15.2)
WBC ADJUSTED: 5.5 10*9/L (ref 3.6–11.2)

## 2023-05-22 LAB — MAGNESIUM: MAGNESIUM: 2 mg/dL (ref 1.6–2.6)

## 2023-05-22 LAB — PHOSPHORUS: PHOSPHORUS: 3.7 mg/dL (ref 2.4–5.1)

## 2023-05-22 LAB — GAMMA GT: GAMMA GLUTAMYL TRANSFERASE: 40 U/L (ref 0–73)

## 2023-05-22 MED ORDER — MIRTAZAPINE 15 MG TABLET
ORAL_TABLET | Freq: Every evening | ORAL | 3 refills | 60.00 days | Status: CP
Start: 2023-05-22 — End: ?

## 2023-05-22 NOTE — Unmapped (Signed)
 Addended by: Alma Friendly on: 05/22/2023 03:17 PM     Modules accepted: Orders

## 2023-05-22 NOTE — Unmapped (Signed)
 FOLLOW UP ANNUAL LIVER CLINIC NOTE     Patient Name: Jeffrey Ward  Medical Record Number: [id number]  Date of Service: 05/22/2023      PCP: [name], MD    Current complaint: Follow up Annual Liver    Assessment/Plan:     Jeffrey Ward is a 73 y.o. 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  Recent imaging stable with continued abscess improvement, no HCC. Patient has been taking 200mg /175mg  of cyclosporine and 500mg  BID of cellcept; immunosuppressive level goal range (100-125).           HEALTH MAINTENANCE:   - Dermatology: This patient is at increased risk for the development of skin cancers due to immunosuppressant medications. We recommend yearly surveillance.  - Colonoscopy: Until age 29, we recommend routine surveillance, aged out  - Dental: no issues  - Mental health: no overt issues  Immunization History   Administered Date(s) Administered    INFLUENZA TIV (TRI) 61MO+ W/ PRESERV (IM) 01/07/2014    Influenza Vaccine Quad(IM)6 MO-Adult(PF) 12/23/2015, 01/12/2017, 12/28/2017    Influenza Virus Vaccine, unspecified formulation 01/07/2014, 12/28/2017    PNEUMOCOCCAL POLYSACCHARIDE 23-VALENT 01/20/2011, 03/01/2017    TdaP 01/12/2009, 05/17/2012    ZOSTAVAX - ZOSTER VACCINE, LIVE, SQ 05/17/2012       Defers all vaccines    Will trial on Mirtazpine instead of tylenol PM/Zzz (too much diphenhydramine)     Reeducated patient on the importance of preventing illness, infection, and opportunistic cancers and the rationale behind vaccines and routine imaging, given his status as an immunocompromised person.     Return to clinic: 1 year  Labs: monthly      Subjective:     Jeffrey Ward is a 73 y.o. male with history of HCV cirrhosis and HCC s/p OLT 09/15/2016 c/b multiple medical problems post-operatively, including hepatic lesions inevitably determined to be abscesses s/p antibiotic therapy and broken hip s/p surgical repair. He additionally, given his multiple intubations, presumed traumatic, he suffered a right vocal fold paralysis and underwent 03/2018 endoscopic CO2 laser and injection for breathy voice and glottal insufficiency with minimal improvement to symptoms. We additionally referred patient to nephrology for CKD III management.    Denies fever, chills. Denies chest pain, SOB, N/V/D, constipation or abdominal pain. No acute complaints today. Weight is down, has had some w/up thru PCP. 8 years since last cscope.     Having sleep issues, takes Tylenol PM or Zzzs to sleep     His wife passed in December 2022, after a lengthy illness, he was her primary caretaker.        Past Medical History:   Diagnosis Date    Atrial fibrillation (CMS-HCC)     Basal cell carcinoma     Cancer (CMS-HCC)     Cirrhosis (CMS-HCC)     Depression     Diabetes (CMS-HCC)     Headache     Hepatitis C 07/17/2012    History of transfusion     Liver disease     Low back pain 07/17/2012    Varices, esophageal (CMS-HCC)        Past Surgical History:   Procedure Laterality Date    ANKLE SURGERY      BACK SURGERY      BACK SURGERY      CARDIAC SURGERY      pacemaker    CHG US GUIDE, TISSUE ABLATION N/A 01/22/2016    Procedure: ULTRASOUND GUIDANCE FOR, AND MONITORING OF, PARENCHYMAL  TISSUE ABLATION;  Surgeon: Particia Nearing, MD;  Location: MAIN OR [facility];  Service: Transplant    IR EMBOLIZATION ORGAN ISCHEMIA, TUMORS, INFAR  06/16/2016    IR EMBOLIZATION ORGAN ISCHEMIA, TUMORS, INFAR 06/16/2016 Ammie Dalton, MD IMG VIR H&V [facility]    PR COLON CA SCRN NOT HI RSK IND N/A 02/27/2015    Procedure: COLOREC CNCR SCR;COLNSCPY NO;  Surgeon: Vonda Antigua, MD;  Location: [facility];  Service: Gastroenterology    PR COLONOSCOPY FLX DX W/COLLJ SPEC WHEN PFRMD N/A 02/06/2023    Procedure: COLONOSCOPY, FLEXIBLE, PROXIMAL TO SPLENIC FLEXURE; DIAGNOSTIC, W/WO COLLECTION SPECIMEN BY BRUSH OR WASH;  Surgeon: Stephannie Li, MD;  Location: GI PROCEDURES [place] [facility];  Service: Gastroenterology    PR ENDOSCOPIC ULTRASOUND EXAM N/A 02/27/2015    Procedure: UGI ENDO; W/ENDO ULTRASOUND EXAM INCLUDES ESOPHAGUS, STOMACH, &/OR DUODENUM/JEJUNUM;  Surgeon: Vonda Antigua, MD;  Location: [facility];  Service: Gastroenterology    PR ERCP BALLOON DILATE BILIARY/PANC DUCT/AMPULLA EA N/A 02/10/2017    Procedure: ERCP;WITH TRANS-ENDOSCOPIC BALLOON DILATION OF BILIARY/PANCREATIC DUCT(S) OR OF AMPULLA, INCLUDING SPHINCTERECTOMY, WHEN PERFOREMD,EACH DUCT (84132);  Surgeon: Mayford Knife, MD;  Location: [facility]EDURES [facility];  Service: Gastroenterology    PR ERCP REMOVE FOREIGN BODY/STENT BILIARY/PANC DUCT N/A 02/10/2017    Procedure: ENDOSCOPIC RETROGRADE CHOLANGIOPANCREATOGRAPHY (ERCP); W/ REMOVAL OF FOREIGN BODY/STENT FROM BILIARY/PANCREATIC DUCT(S);  Surgeon: Mayford Knife, MD;  Location: [facility]URES [facility];  Service: Gastroenterology    PR ERCP REMOVE FOREIGN BODY/STENT BILIARY/PANC DUCT N/A 06/29/2017    Procedure: ENDOSCOPIC RETROGRADE CHOLANGIOPANCREATOGRAPHY (ERCP); W/ REMOVAL OF FOREIGN BODY/STENT FROM BILIARY/PANCREATIC DUCT(S);  Surgeon: Vonda Antigua, MD;  Location: [facility]URES [facility];  Service: Gastroenterology    PR ERCP STENT PLACEMENT BILIARY/PANCREATIC DUCT N/A 11/29/2016    Procedure: ENDOSCOPIC RETROGRADE CHOLANGIOPANCREATOGRAPHY (ERCP); WITH PLACEMENT OF ENDOSCOPIC STENT INTO BILIARY OR PANCREATIC DUCT;  Surgeon: Chriss Driver, MD;  Location: [facility]URES [facility];  Service: Gastroenterology    PR ERCP STENT PLACEMENT BILIARY/PANCREATIC DUCT N/A 04/26/2017    Procedure: ENDOSCOPIC RETROGRADE CHOLANGIOPANCREATOGRAPHY (ERCP); WITH PLACEMENT OF ENDOSCOPIC STENT INTO BILIARY OR PANCREATIC DUCT;  Surgeon: Mayford Knife, MD;  Location: [facility]URES [facility];  Service: Gastroenterology    PR ERCP,W/REMOVAL STONE,BIL/PANCR DUCTS N/A 11/29/2016    Procedure: ERCP; W/ENDOSCOPIC RETROGRADE REMOVAL OF CALCULUS/CALCULI FROM BILIARY &/OR PANCREATIC DUCTS; Surgeon: Chriss Driver, MD;  Location: GI PROCEDURES [facility];  Service: Gastroenterology    PR ERCP,W/REMOVAL STONE,BIL/PANCR DUCTS N/A 02/10/2017    Procedure: ERCP; W/ENDOSCOPIC RETROGRADE REMOVAL OF CALCULUS/CALCULI FROM BILIARY &/OR PANCREATIC DUCTS;  Surgeon: Mayford Knife, MD;  Location: GI PROCEDURES [facility];  Service: Gastroenterology    PR ERCP,W/REMOVAL STONE,BIL/PANCR DUCTS N/A 04/26/2017    Procedure: ERCP; W/ENDOSCOPIC RETROGRADE REMOVAL OF CALCULUS/CALCULI FROM BILIARY &/OR PANCREATIC DUCTS;  Surgeon: Mayford Knife, MD;  Location: GI PROCEDURES [facility];  Service: Gastroenterology    PR ERCP,W/REMOVAL STONE,BIL/PANCR DUCTS N/A 06/29/2017    Procedure: ERCP; W/ENDOSCOPIC RETROGRADE REMOVAL OF CALCULUS/CALCULI FROM BILIARY &/OR PANCREATIC DUCTS;  Surgeon: Vonda Antigua, MD;  Location: GI PROCEDURES [facility];  Service: Gastroenterology    PR ESOPHAGOSCP RIG TRANSORAL HYPOPHARYNX CRV ESOPH N/A 04/04/2018    Procedure: ESOPHAGOSCOPY, RIGID, TRANSORAL WITH DIVERTICULECTOMY OF HYPOPHARYNX OR CERVICAL ESOPHAGUS, WITH CRICOPHARYNGEAL MYOTOMY, INCLUDES USE OF TELESCOPE OR OPERATING MICROSCOPE AND REPAIR, WHEN PERFORMED;  Surgeon: Vista Deck, MD;  Location: MAIN OR [facility];  Service: ENT    PR INSER HEART TEMP  PACER ONE CHMBR N/A 10/02/2016    Procedure: Tempoarary Pacemaker Insertion;  Surgeon: Meredith Leeds, MD;  Location: [facility];  Service: Cardiology    PR LAP,DIAGNOSTIC ABDOMEN N/A 01/22/2016    Procedure: Laparoscopy, Abdomen, Peritoneum, & Omentum, Diagnostic, W/Wo Collection Specimen(S) By Brushing Or Washing;  Surgeon: Particia Nearing, MD;  Location: MAIN OR [facility];  Service: Transplant    PR LARYNGOPLASTY MEDIALIZATION Neysa Bonito N/A 11/07/2018    Procedure: R31 LARYNGOPLASTY, MEDIALIZATION, UNILATERAL;  Surgeon: Vista Deck, MD;  Location: MAIN OR [facility];  Service: ENT    PR PLACE PERCUT GASTROSTOMY TUBE N/A 11/17/2016    Procedure: UGI ENDO; W/DIRECTED PLCMT PERQ GASTROSTOMY TUBE;  Surgeon: Cletis Athens, MD;  Location: GI PROCEDURES [facility];  Service: Gastroenterology    PR TRACHEOSTOMY, PLANNED N/A 09/29/2016    Procedure: TRACHEOSTOMY PLANNED (SEPART PROC);  Surgeon: Katherina Mires, MD;  Location: MAIN OR [facility];  Service: Trauma    PR TRANSCATH INSERT OR REPLACE LEADLESS PM VENTR N/A 10/11/2016    Procedure: Pacemaker Implant/Replace Leadless;  Surgeon: Meredith Leeds, MD;  Location: [facility];  Service: Cardiology    PR TRANSPLANT LIVER,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    Procedure: Liver Allotransplantation; Orthotopic, Partial Or Whole, From Cadaver Or Living Donor, Any Age;  Surgeon: Doyce Loose, MD;  Location: MAIN OR [facility];  Service: Transplant    PR TRANSPLANT,PREP DONOR LIVER, WHOLE N/A 09/15/2016    Procedure: Rogelia Boga Std Prep Cad Donor Whole Liver Gft Prior Tnsplnt,Inc Chole,Diss/Rem Surr Tissu Wo Triseg/Lobe Splt;  Surgeon: Doyce Loose, MD;  Location: MAIN OR [facility];  Service: Transplant    PR UPPER GI ENDOSCOPY,BIOPSY N/A 07/17/2012    Procedure: UGI ENDOSCOPY; WITH BIOPSY, SINGLE OR MULTIPLE;  Surgeon: Alba Destine, MD;  Location: [facility]URES [facility];  Service: Gastroenterology    PR UPPER GI ENDOSCOPY,DIAGNOSIS N/A 02/04/2014    Procedure: UGI ENDO, INCLUDE ESOPHAGUS, STOMACH, & DUODENUM &/OR JEJUNUM; DX W/WO COLLECTION SPECIMN, BY BRUSH OR WASH;  Surgeon: Wilburt Finlay, MD;  Location: [facility]URES [facility];  Service: Gastroenterology    PR UPPER GI ENDOSCOPY,LIGAT VARIX N/A 11/05/2013    Procedure: UGI ENDO; Everlene Balls LIG ESOPH &/OR GASTRIC VARICES;  Surgeon: Wilburt Finlay, MD;  Location: [facility];  Service: Gastroenterology       Family History   Problem Relation Age of Onset    Hypertension Mother     Cancer Mother     Heart disease Mother     Heart disease Father     Cirrhosis Neg Hx     Liver cancer Neg Hx     Anemia Neg Hx     Diabetes Neg Hx     Kidney disease Neg Hx Obesity Neg Hx     Thyroid disease Neg Hx     Osteoporosis Neg Hx     Coronary artery disease Neg Hx     Anesthesia problems Neg Hx     Basal cell carcinoma Neg Hx     Squamous cell carcinoma Neg Hx        Social History     Socioeconomic History    Marital status: Widowed   Tobacco Use    Smoking status: Never     Passive exposure: Past    Smokeless tobacco: Never    Tobacco comments:     Smoked in high school for about 5 years.    Substance and Sexual Activity    Alcohol use: No  Drug use: No    Sexual activity: Yes     Partners: Female     Birth control/protection: None   Other Topics Concern    Exercise No    Living Situation No     Social Drivers of Psychologist, prison and probation services Strain: Low Risk  (11/11/2022)    Received from [facility]    Overall Financial Resource Strain (CARDIA)     Difficulty of Paying Living Expenses: Not hard at all   Food Insecurity: No Food Insecurity (03/28/2023)    Received from [facility]    Hunger Vital Sign     Worried About Running Out of Food in the Last Year: Never true     Ran Out of Food in the Last Year: Never true   Transportation Needs: No Transportation Needs (03/28/2023)    Received from [facility] - Transportation     In the past 12 months, has lack of transportation kept you from medical appointments or from getting medications?: No     Lack of Transportation (Non-Medical): No   Housing: Unknown (03/28/2023)    Received from [facility]    Housing Stability Vital Sign     Unable to Pay for Housing in the Last Year: No     Homeless in the Last Year: No       REVIEW OF SYSTEMS:   The balance of 10/12 systems is negative with the exception of HPI.    Objective:     MEDICATIONS:  No Known Allergies    Current Outpatient Medications   Medication Sig Dispense Refill    alendronate (FOSAMAX) 70 MG tablet Take 1 tablet every Sunday on empty stomach with a full glass of water. Do not lie down or eat/drink anything else for the next 30 min. 12 tablet 3    BASAGLAR KWIKPEN U-100 INSULIN 100 unit/mL (3 mL) injection pen Inject 0.14 mL (14 Units total) under the skin at bedtime. 15 mL 4    blood-glucose meter (GLUCOSE MONITORING KIT) kit Use as instructed 1 each 0    cholecalciferol, vitamin D3, 125 mcg (5,000 unit) tablet Take 1 tablet (125 mcg total) by mouth daily.      cycloSPORINE modified (NEORAL) 100 MG capsule Take 2 capsules (200 mg total) by mouth daily AND 1 capsule (100 mg total) nightly. 270 capsule 3    cycloSPORINE modified (NEORAL) 25 MG capsule Take 3 capsules (75 mg total) by mouth nightly (with a 100mg  capsule). For a total dose of 200 mg in the morning , 175mg  every evening 90 capsule 11    DULoxetine (CYMBALTA) 30 MG capsule Take 2 capsules (60 mg total) by mouth nightly. 60 capsule 11    insulin glargine (BASAGLAR, LANTUS) 100 unit/mL (3 mL) injection pen Inject 0.14 mL (14 Units total) under the skin two (2) times a day. 15 mL 3    insulin glargine (LANTUS SOLOSTAR U-100 INSULIN) 100 unit/mL (3 mL) injection pen Inject 0.17 mL (17 Units total) under the skin at bedtime. 15.3 mL 3    insulin glargine (LANTUS SOLOSTAR U-100 INSULIN) 100 unit/mL (3 mL) injection pen Inject 0.14 mL (14 Units total) under the skin at bedtime. 17 mL 2    melatonin 3 mg Tab Take 1 tablet (3 mg total) by mouth nightly. 30 tablet 0    metoprolol succinate (TOPROL-XL) 25 MG 24 hr tablet Take 1 tablet (25 mg  total) by mouth.      mirtazapine (REMERON) 15 MG tablet Take 1 tablet (15 mg total) by mouth nightly. 90 tablet 3    mycophenolate (CELLCEPT) 250 mg capsule Take 2 capsules (500 mg total) by mouth two (2) times a day. 120 capsule 11    pen needle, diabetic (ULTICARE PEN NEEDLE) 32 gauge x 5/32 (4 mm) Ndle USE WITH INSULIN PEN 4 TIMES DAILY AS DIRECTED 100 each 1    pen needle, diabetic (ULTICARE PEN NEEDLE) 32 gauge x 5/32 (4 mm) Ndle Use with insulin twice a day 200 each 1    polyethylene glycol (GOLYTELY) 236-22.74-6.74 gram solution Take by mouth as directed per [facility] GI prep instructions, for split bowel prep. 4000 mL 0    rosuvastatin (CRESTOR) 10 MG tablet Take 1 tablet (10 mg total) by mouth once daily 90 tablet 3    rosuvastatin (CRESTOR) 5 MG tablet Take 1 tablet (5 mg total) by mouth daily. 90 tablet 3    sildenafiL, pulm.hypertension, (REVATIO) 20 mg tablet Take 2 tablets (40 mg total) by mouth Three (3) times a day. 180 tablet 6    tamsulosin (FLOMAX) 0.4 mg capsule Take 1 capsule (0.4 mg total) by mouth daily. 90 capsule 0     No current facility-administered medications for this visit.         PHYSICAL EXAM:  Vitals:    05/22/23 1254   BP: 138/72   BP Position: Sitting   Temp: 36.6 ??C (97.9 ??F)   SpO2: 98%   Weight: 65.9 kg (145 lb 4.8 oz)   Height: 175.3 cm (5' 9)          Physical Exam:  General Appearance:   No acute distress  Lungs:                          Clear to auscultation bilaterally  Heart:                           Regular rate and rhythm  Abdomen:                     Soft, non-tender, non-distended, well healed scar  Extremities:                  Warm and well perfused        LAB RESULTS:  Results for orders placed or performed in visit on 05/22/23   Comprehensive Metabolic Panel   Result Value Ref Range    Sodium 140 135 - 145 mmol/L    Potassium 4.2 3.4 - 4.8 mmol/L    Chloride 103 98 - 107 mmol/L    CO2 26.6 20.0 - 31.0 mmol/L    Anion Gap 10 5 - 14 mmol/L    BUN 26 (H) 9 - 23 mg/dL    Creatinine 1.61 (H) 0.73 - 1.18 mg/dL    BUN/Creatinine Ratio 15     eGFR CKD-EPI (2021) Male 42 (L) >=60 mL/min/1.74m2    Glucose 256 (H) 70 - 179 mg/dL    Calcium 9.8 8.7 - 09.6 mg/dL    Albumin 3.7 3.4 - 5.0 g/dL    Total Protein 6.6 5.7 - 8.2 g/dL    Total Bilirubin 0.4 0.3 - 1.2 mg/dL    AST 11 <=04 U/L    ALT 9 (L) 10 - 49 U/L    Alkaline Phosphatase 88 46 - 116  U/L   Bilirubin, Direct   Result Value Ref Range    Bilirubin, Direct 0.10 0.00 - 0.30 mg/dL   Phosphorus Level   Result Value Ref Range Phosphorus 3.7 2.4 - 5.1 mg/dL   Magnesium Level   Result Value Ref Range    Magnesium 2.0 1.6 - 2.6 mg/dL   CBC w/ Differential   Result Value Ref Range    WBC 5.5 3.6 - 11.2 10*9/L    RBC 4.52 4.26 - 5.60 10*12/L    HGB 14.3 12.9 - 16.5 g/dL    HCT 16.1 09.6 - 04.5 %    MCV 89.6 77.6 - 95.7 fL    MCH 31.6 25.9 - 32.4 pg    MCHC 35.3 32.0 - 36.0 g/dL    RDW 40.9 81.1 - 91.4 %    MPV 8.6 6.8 - 10.7 fL    Platelet 120 (L) 150 - 450 10*9/L    Neutrophils % 72.4 %    Lymphocytes % 19.6 %    Monocytes % 6.8 %    Eosinophils % 0.6 %    Basophils % 0.6 %    Absolute Neutrophils 4.0 1.8 - 7.8 10*9/L    Absolute Lymphocytes 1.1 1.1 - 3.6 10*9/L    Absolute Monocytes 0.4 0.3 - 0.8 10*9/L    Absolute Eosinophils 0.0 0.0 - 0.5 10*9/L    Absolute Basophils 0.0 0.0 - 0.1 10*9/L     *Note: Due to a large number of results and/or encounters for the requested time period, some results have not been displayed. A complete set of results can be found in Results Review.         IMAGING:  Reviewed

## 2023-05-22 NOTE — Unmapped (Signed)
 For sleep   Try 1/2 tablet of mirtazapine to see if it help with sleep rather than the Tylenol PM or Zzzs  You may increase to a full tablet if not effective.   Sent to [facility] in [place]       It is OK to take up to 2000mg  of acetaminophen (tylenol), please be cautious of combination products. Do not take ibuprofen, naproxen, aspirin, advil, aleve, motrin, Excedrin or headache powders. Avoid herbal or natural supplements.      Please contact my nurse, Josie Dixon, RN at [phone] If you have questions or concerns or need to get in touch with me.       Please Return to clinic in 1 year        If you have any questions or concerns please contact us on MyChart or as below:     For appointments, please call [phone].  For scheduling radiology appointments [phone].  For scheduling GI procedures (e.g,. Colonoscopy), please call [phone].  For emergencies after hours call [phone] and ask for the GI medicine fellow on call.          Vallery Sa, NP-C  [facility]  [address] [place], [place] [address]  [place], [place] [address]  Ph: [phone]  Fax: [phone]

## 2023-05-23 DIAGNOSIS — I272 Pulmonary hypertension, unspecified: Principal | ICD-10-CM

## 2023-05-23 LAB — CYCLOSPORINE LEVEL: CYCLOSPORINE (FPIA) BLOOD: <10 ng/mL

## 2023-05-23 NOTE — Unmapped (Signed)
 [facility] Refill Coordination Note    Specialty Medication(s) to be Shipped:   Transplant: mycophenolate mofetil 250mg , cyclosporine 25mg , and cyclosporine 100mg     Other medication(s) to be shipped:  sildenafil and rosuvastatin     Jeffrey Ward, DOB: 05-21-1950  Phone: [phone] (home)       All above HIPAA information was verified with patient.     Was a Nurse, learning disability used for this call? No    Completed refill call assessment today to schedule patient's medication shipment from the [facility]  [phone]).  All relevant notes have been reviewed.     Specialty medication(s) and dose(s) confirmed: Regimen is correct and unchanged.   Changes to medications: Zaidan reports no changes at this time.  Changes to insurance: No  New side effects reported not previously addressed with a pharmacist or physician: None reported  Questions for the pharmacist: No    Confirmed patient received a Conservation officer, historic buildings and a Surveyor, mining with first shipment. The patient will receive a drug information handout for each medication shipped and additional FDA Medication Guides as required.       DISEASE/MEDICATION-SPECIFIC INFORMATION        N/A    SPECIALTY MEDICATION ADHERENCE     Medication Adherence    Patient reported X missed doses in the last month: 0  Specialty Medication: cycloSPORINE modified 100 MG capsule (Neoral)  Patient is on additional specialty medications: Yes  Additional Specialty Medications: cycloSPORINE modified 25 MG capsule (Neoral)  Patient Reported Additional Medication X Missed Doses in the Last Month: 0  Patient is on more than two specialty medications: Yes  Specialty Medication: mycophenolate 250 mg capsule (CELLCEPT)  Patient Reported Additional Medication X Missed Doses in the Last Month: 1-2  Support network for adherence: family member              Were doses missed due to medication being on hold? No     cycloSPORINE modified 25 MG capsule (Neoral): 3 days of medicine on hand    cycloSPORINE modified 100 MG capsule (Neoral): 3 days of medicine on hand    mycophenolate 250 mg capsule (CELLCEPT): 3 days of medicine on hand       REFERRAL TO PHARMACIST     Referral to the pharmacist: Yes - routine compliance concerns. Patient has missed 1-3 doses of medication. Refills were scheduled and concern routed to pharmacist for evaluation.      SHIPPING     Shipping address confirmed in Epic.     Cost and Payment: Patient has a copay of $227.89. They are aware and have authorized the pharmacy to charge the credit card on file.    Delivery Scheduled: Yes, Expected medication delivery date: 05/24/23.     Medication will be delivered via Same Day Courier to the prescription address in Epic WAM.    Craige Cotta   [facility]  Specialty Technician

## 2023-05-23 NOTE — Unmapped (Signed)
 Completed PA and QLE for Sildenafil on phone with [employer]. This is being processed for urgent review. Waiting for determination.

## 2023-05-24 NOTE — Unmapped (Signed)
 Received a call from [facility] stating Sildenafil PA and QLE for #180 for 30 days has been approved. They will send fax with additional info. Will import to Media once received.

## 2023-05-25 MED FILL — ROSUVASTATIN 5 MG TABLET: ORAL | 90 days supply | Qty: 90 | Fill #1

## 2023-05-25 MED FILL — MYCOPHENOLATE MOFETIL 250 MG CAPSULE: ORAL | 30 days supply | Qty: 120 | Fill #2

## 2023-05-25 MED FILL — CYCLOSPORINE MODIFIED 25 MG CAPSULE: ORAL | 30 days supply | Qty: 90 | Fill #0

## 2023-06-06 NOTE — Unmapped (Signed)
 Manual Transmission Text reminder sent

## 2023-06-07 ENCOUNTER — Ambulatory Visit: Admit: 2023-06-07 | Discharge: 2023-06-08 | Payer: MEDICARE

## 2023-06-07 DIAGNOSIS — Z45018 Encounter for adjustment and management of other part of cardiac pacemaker: Principal | ICD-10-CM

## 2023-06-07 NOTE — Unmapped (Signed)
 Spoke with patient and requested a manual transmission for review.   Offered assistance with performing the transmission.   Patient stated he could do the transmission on his own and did not need any assistance at this time.

## 2023-06-30 NOTE — Unmapped (Signed)
 [facility] and Home Delivery Pharmacy Refill Coordination Note    Specialty Medication(s) to be Shipped:   Transplant: mycophenolate  mofetil 250mg , cyclosporine  25mg , and cyclosporine  100mg     Other medication(s) to be shipped:  sildenafiL  (pulm.hypertension) 20 mg tablet (REVATIO )     Jeffrey Ward, DOB: 1951/02/04  Phone: [phone] (home)       All above HIPAA information was verified with patient.     Was a Nurse, learning disability used for this call? No    Completed refill call assessment today to schedule patient's medication shipment from the [facility] and [facility] Delivery Pharm[facility]  [phone]).  All relevant notes have been reviewed.     Specialty medication(s) and dose(s) confirmed: Regimen is correct and unchanged.   Changes to medications: [name] reports no changes at this time.  Changes to insurance: No  New side effects reported not previously addressed with a pharmacist or physician: None reported  Questions for the pharmacist: No    Confirmed patient received a Conservation officer, historic buildings and a Surveyor, mining with first shipment. The patient will receive a drug information handout for each medication shipped and additional FDA Medication Guides as required.       DISEASE/MEDICATION-SPECIFIC INFORMATION        N/A    SPECIALTY MEDICATION ADHERENCE     Medication Adherence    Patient reported X missed doses in the last month: 2  Specialty Medication: cycloSPORINE  modified 100 MG capsule (Neoral )  Patient is on additional specialty medications: Yes  Additional Specialty Medications: cycloSPORINE  modified 25 MG capsule (Neoral )  Patient Reported Additional Medication X Missed Doses in the Last Month: 2  Patient is on more than two specialty medications: Yes  Specialty Medication: mycophenolate  250 mg capsule (CELLCEPT )  Patient Reported Additional Medication X Missed Doses in the Last Month: 2  Support network for adherence: family member              Were doses missed due to medication being on hold? No cycloSPORINE  modified 100 MG capsule (Neoral ): 3 days of medicine on hand    cycloSPORINE  modified 25 MG capsule (Neoral ): 3 days of medicine on hand    mycophenolate  250 mg capsule (CELLCEPT ): 3 days of medicine on hand       REFERRAL TO PHARMACIST     Referral to the pharmacist: Not needed      [facility]     Shipping address confirmed in Epic.     Cost and Payment: Patient has a copay of $76.80. They are aware and have authorized the pharmacy to charge the credit card on file.    Delivery Scheduled: Yes, Expected medication delivery date: 07/03/2023.     Medication will be delivered via Same Day Courier to the prescription address in Epic WAM.    Jeffrey Ward   [facility] Special[facility] and Home Delivery Pharm[facility]  Specialty Technician

## 2023-07-03 MED FILL — CYCLOSPORINE MODIFIED 25 MG CAPSULE: ORAL | 30 days supply | Qty: 90 | Fill #1

## 2023-07-03 MED FILL — SILDENAFIL (PULMONARY HYPERTENSION) 20 MG TABLET: ORAL | 30 days supply | Qty: 180 | Fill #1

## 2023-07-03 MED FILL — MYCOPHENOLATE MOFETIL 250 MG CAPSULE: ORAL | 30 days supply | Qty: 120 | Fill #3

## 2023-07-03 MED FILL — CYCLOSPORINE MODIFIED 100 MG CAPSULE: ORAL | 30 days supply | Qty: 90 | Fill #1

## 2023-07-13 DIAGNOSIS — Z72820 Sleep deprivation: Principal | ICD-10-CM

## 2023-07-13 MED ORDER — MIRTAZAPINE 15 MG TABLET
ORAL_TABLET | 3 refills | 0.00000 days
Start: 2023-07-13 — End: ?

## 2023-07-14 MED ORDER — MIRTAZAPINE 15 MG TABLET
ORAL_TABLET | 3 refills | 0.00000 days
Start: 2023-07-14 — End: ?

## 2023-08-02 NOTE — Unmapped (Signed)
 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     Was a Nurse, learning disability used for this call? No    Completed refill call assessment today to schedule patient's medication shipment from the [facility] and [facility] Delivery Pharm[facility]  [phone]).  All relevant notes have been reviewed.     Specialty medication(s) and dose(s) confirmed: Regimen is correct and unchanged.   Changes to medications: Adeoluwa reports no changes at this time.  Changes to insurance: No  New side effects reported not previously addressed with a pharmacist or physician: None reported  Questions for the pharmacist: No    Confirmed patient received a Conservation officer, historic buildings and a Surveyor, mining with first shipment. The patient will receive a drug information handout for each medication shipped and additional FDA Medication Guides as required.       DISEASE/MEDICATION-SPECIFIC INFORMATION        N/A    SPECIALTY MEDICATION ADHERENCE     Medication Adherence    Patient reported X missed doses in the last month: 2  Specialty Medication: mycophenolate 250 mg capsule (CELLCEPT)  Patient is on additional specialty medications: Yes  Additional Specialty Medications: sildenafiL (pulm.hypertension) 20 mg tablet (REVATIO)  Patient Reported Additional Medication X Missed Doses in the Last Month: 0  Support network for adherence: family member              Were doses missed due to medication being on hold? No      mycophenolate 250 mg capsule (CELLCEPT): 2 days of medicine on hand     sildenafiL (pulm.hypertension) 20 mg tablet (REVATIO): 2 days of medicine on hand       REFERRAL TO PHARMACIST     Referral to the pharmacist: Yes - routine compliance concerns. Patient has missed 1-3 doses of medication. Refills were scheduled and concern routed to pharmacist for evaluation.      SHIPPING     Shipping address confirmed in Epic.     Cost and Payment: Patient has a copay of $34.99. They are aware and have authorized the pharmacy to charge the credit card on file.    Delivery Scheduled: Yes, Expected medication delivery date: 5.22.2025.     Medication will be delivered via Same Day Courier to the prescription address in Epic WAM.    Marcella Serge   [facility] Special[facility] and Home Delivery Pharmacy  Specialty Technician

## 2023-08-03 MED FILL — MYCOPHENOLATE MOFETIL 250 MG CAPSULE: ORAL | 30 days supply | Qty: 120 | Fill #4

## 2023-08-03 MED FILL — SILDENAFIL (PULMONARY HYPERTENSION) 20 MG TABLET: ORAL | 30 days supply | Qty: 180 | Fill #2

## 2023-08-15 MED FILL — LANTUS SOLOSTAR U-100 INSULIN 100 UNIT/ML (3 ML) SUBCUTANEOUS PEN: SUBCUTANEOUS | 88 days supply | Qty: 15 | Fill #1

## 2023-08-29 NOTE — Unmapped (Signed)
 The [facility] has made a second and final attempt to reach this patient to refill the following medication:mycophenolate 250 mg capsule (CELLCEPT),cycloSPORINE modified 100 MG capsule (Neoral) and cycloSPORINE modified 25 MG capsule (Neoral)  .      We have left voicemails on the following phone numbers: [phone], have sent a MyChart message, and have sent a text message to the following phone numbers: [phone].    Dates contacted: 08/25/2023 - 08/29/2023   Last scheduled delivery: 07/03/2023 and 08/03/2023     The patient may be at risk of non-compliance with this medication. The patient should call the [facility] at [phone]  Option 4, then Option 4: Infectious Disease, Transplant to refill medication.    Nelida Guan   [facility] Specialty and [facility]

## 2023-08-30 NOTE — Unmapped (Signed)
 Jeffrey Ward has been contacted in regards to their refill of mycophenolate 250 mg capsule (CELLCEPT),cycloSPORINE modified 100 MG capsule (Neoral) and  cycloSPORINE modified 25 MG capsule (Neoral). At this time, they have declined refill due to has over a week left, call next week. Refill assessment call date has been updated per the patient's request.

## 2023-09-12 NOTE — Unmapped (Signed)
 The [facility] has made a second and final attempt to reach this patient to refill the following medication:mycophenolate  250 mg capsule (CELLCEPT ), cycloSPORINE  modified 100 MG capsule (Neoral ), cycloSPORINE  modified 25 MG capsule (Neoral ).      We have left voicemails on the following phone numbers: [phone] , have sent a MyChart message, and have sent a text message to the following phone numbers: [phone] .    Dates contacted: 09/06/2023 - 09/12/2023   Last scheduled delivery: 07/03/2023 - 08/03/2023     The patient may be at risk of non-compliance with this medication. The patient should call the [facility] at [phone]  Option 4, then Option 4: Infectious Disease, Transplant to refill medication.    Nelida Guan   [facility] Specialty and [facility]

## 2023-09-15 DIAGNOSIS — Z72820 Sleep deprivation: Principal | ICD-10-CM

## 2023-09-15 MED ORDER — MIRTAZAPINE 15 MG TABLET
ORAL_TABLET | 3 refills | 0.00000 days
Start: 2023-09-15 — End: ?

## 2023-09-18 DIAGNOSIS — Z944 Liver transplant status: Principal | ICD-10-CM

## 2023-09-18 DIAGNOSIS — Z72820 Sleep deprivation: Principal | ICD-10-CM

## 2023-09-18 DIAGNOSIS — Z796 Long term current use of immunosuppressive drug: Principal | ICD-10-CM

## 2023-09-18 MED ORDER — MIRTAZAPINE 15 MG TABLET
ORAL_TABLET | Freq: Every evening | ORAL | 3 refills | 0.00000 days
Start: 2023-09-18 — End: ?

## 2023-09-18 NOTE — Unmapped (Signed)
 Placed order for 90 refill of Remeron  per request.

## 2023-09-21 NOTE — Unmapped (Signed)
trf

## 2023-10-24 DIAGNOSIS — Z944 Liver transplant status: Principal | ICD-10-CM

## 2023-10-24 DIAGNOSIS — D849 Immunodeficiency, unspecified: Principal | ICD-10-CM

## 2023-10-24 NOTE — Unmapped (Signed)
 [facility] and Home Delivery Pharmacy Clinical Assessment & Refill Coordination Note    Jeffrey Ward, DOB: 11-Dec-1950  Phone: [phone] (home)     All above HIPAA information was verified with patient.     Was a Nurse, learning disability used for this call? No    Specialty Medication(s):   Transplant: cyclosporine  100mg , cyclosporine  25mg , and mycophenolate  mofetil 250mg      Current Medications[1]     Changes to medications: Jeffrey Ward reports no changes at this time.    Medication list has been reviewed and updated in Epic: Yes  High priority alerts noted on med rec today:  Crestor  - 2 listed on profile, pt says only takes 1 strength  Lantus  - 2 listed on profile, pt says only takes 1  Cyclosporine  - on 2 different strengths  Interaction rosuvastatin  and cyclo- clinic aware    Allergies[2]    Changes to allergies: No    Allergies have been reviewed and updated in Epic: Yes    SPECIALTY MEDICATION ADHERENCE     Cyclosporine  100mg   : 18 days of medicine on hand   Cyclosporine  25mg   : 18 days of medicine on hand   Mycophenolate  250mg   : 18 days of medicine on hand       Medication Adherence    Patient reported X missed doses in the last month: 0  Specialty Medication: mycophenolate  250mg   Patient is on additional specialty medications: Yes  Additional Specialty Medications: Cyclosporine  100mg   Patient Reported Additional Medication X Missed Doses in the Last Month: 0  Patient is on more than two specialty medications: Yes  Specialty Medication: cyclosporine  25mg   Patient Reported Additional Medication X Missed Doses in the Last Month: 0  Support network for adherence: family member          Specialty medication(s) dose(s) confirmed: Regimen is correct and unchanged.     Are there any concerns with adherence? No    Adherence counseling provided? Not needed    CLINICAL MANAGEMENT AND INTERVENTION      Clinical Benefit Assessment:    Do you feel the medicine is effective or helping your condition? Yes    Clinical Benefit counseling provided? Not needed    Adverse Effects Assessment:    Are you experiencing any side effects? No    Are you experiencing difficulty administering your medicine? No    Quality of Life Assessment:    Quality of Life    Rheumatology  Oncology  Dermatology  Cystic Fibrosis          How many days over the past month did your transplant  keep you from your normal activities? For example, brushing your teeth or getting up in the morning. 0    Have you discussed this with your provider? Not needed    Acute Infection Status:    Acute infections noted within Epic:  No active infections    Patient reported infection: None    Therapy Appropriateness:    Is therapy appropriate based on current medication list, adverse reactions, adherence, clinical benefit and progress toward achieving therapeutic goals? Yes, therapy is appropriate and should be continued     Clinical Intervention:    Was an intervention completed as part of this clinical assessment? No    DISEASE/MEDICATION-SPECIFIC INFORMATION      N/A    Solid Organ Transplant: Not Applicable    PATIENT SPECIFIC NEEDS     Does the patient have any physical, cognitive, or cultural barriers? No    Is  the patient high risk? Yes, patient is taking a REMS drug. Medication is dispensed in compliance with REMS program    Does the patient require physician intervention or other additional services (i.e., nutrition, smoking cessation, social work)? No    Does the patient have an additional or emergency contact listed in their chart? No and patient declined to add one in chart today, clinic aware    SOCIAL DETERMINANTS OF HEALTH     At the [facility], we have learned that life circumstances - like trouble affording food, housing, utilities, or transportation can affect the health of many of our patients.   That is why we wanted to ask: are you currently experiencing any life circumstances that are negatively impacting your health and/or quality of life? Patient declined to answer    Social Drivers of Health     Food Insecurity: No Food Insecurity (03/28/2023)    Received from [facility]    Hunger Vital Sign     Worried About Running Out of Food in the Last Year: Never true     Ran Out of Food in the Last Year: Never true   Tobacco Use: Low Risk  (05/22/2023)    Patient History     Smoking Tobacco Use: Never     Smokeless Tobacco Use: Never     Passive Exposure: Past   Transportation Needs: No Transportation Needs (03/28/2023)    Received from [facility] - Transportation     In the past 12 months, has lack of transportation kept you from medical appointments or from getting medications?: No     Lack of Transportation (Non-Medical): No   Alcohol Use: Not At Risk (03/28/2023)    Received from [facility]    AUDIT-C     Frequency of Alcohol Consumption: Never     Average Number of Drinks: Patient does not drink     Frequency of Binge Drinking: Never   Housing: Unknown (03/28/2023)    Received from [facility]    Housing Stability Vital Sign     Unable to Pay for Housing in the Last Year: No     Number of Times Moved in the Last Year: Not on file     Homeless in the Last Year: No   Physical Activity: Not on file   Utilities: Not At Risk (03/28/2023)    Received from [facility] Utilities     Threatened with loss of utilities: No   Stress: Not on file   Interpersonal Safety: Not on file   Substance Use: Not on file (01/16/2023)   Intimate Partner Violence: Not on file   Social Connections: Not on file   Financial Resource Strain: Low Risk  (11/11/2022)    Received from [facility]    Overall Financial Resource Strain (CARDIA)     Difficulty of Paying Living Expenses: Not hard at all   Health Literacy: Not on file   Internet Connectivity: Not on file       Would you be willing to receive help with any of the needs that you have identified today? Not applicable SHIPPING     Specialty Medication(s) to be Shipped:   N/a    Other medication(s) to be shipped: sildenafil     Specialty Medications not needed at this time: Transplant: cyclosporine  100mg , cyclosporine  25mg , and mycophenolate  mofetil 250mg   Patient declined ALL other refills  at this time  He is aware next call is set up for 1 week out but to contact [facility] with any needs sooner     Changes to insurance: Yes: new [employer] added, message sent to triage for test claims/referrals for future fills    Cost and Payment: Patient has a copay of $69.24. They are aware and have authorized the pharmacy to charge the credit card on file.    Delivery Scheduled: Yes, Expected medication delivery date: 10/26/2023.     Medication will be delivered via UPS to the confirmed prescription address in [facility].    The patient will receive a drug information handout for each medication shipped and additional FDA Medication Guides as required.  Verified that patient has previously received a Conservation officer, historic buildings and a Surveyor, mining.    The patient or caregiver noted above participated in the development of this care plan and knows that they can request review of or adjustments to the care plan at any time.      All of the patient's questions and concerns have been addressed.    Ronal FORBES Reusing, PharmD   [facility] Special[facility] and Home Delivery Pharmacy Specialty Pharmacist       [1]   Current Outpatient Medications   Medication Sig Dispense Refill    alendronate  (FOSAMAX ) 70 MG tablet Take 1 tablet every Sunday on empty stomach with a full glass of water. Do not lie down or eat/drink anything else for the next 30 min. 12 tablet 3    blood-glucose meter (GLUCOSE MONITORING KIT) kit Use as instructed 1 each 0    cholecalciferol , vitamin D3, 125 mcg (5,000 unit) tablet Take 1 tablet (125 mcg total) by mouth daily.      cycloSPORINE  modified (NEORAL ) 100 MG capsule Take 2 capsules (200 mg total) by mouth daily AND 1 capsule (100 mg total) nightly. 270 capsule 3    cycloSPORINE  modified (NEORAL ) 25 MG capsule Take 3 capsules (75 mg total) by mouth nightly (with a 100mg  capsule). For a total dose of 200 mg in the morning , 175mg  every evening 90 capsule 11    DULoxetine  (CYMBALTA ) 30 MG capsule Take 2 capsules (60 mg total) by mouth nightly. 60 capsule 11    insulin  glargine (LANTUS  SOLOSTAR U-100 INSULIN ) 100 unit/mL (3 mL) injection pen Inject 0.17 mL (17 Units total) under the skin at bedtime. 15.3 mL 3    insulin  glargine (LANTUS  SOLOSTAR U-100 INSULIN ) 100 unit/mL (3 mL) injection pen Inject 0.14 mL (14 Units total) under the skin at bedtime. 17 mL 2    melatonin 3 mg Tab Take 1 tablet (3 mg total) by mouth nightly. 30 tablet 0    metoprolol succinate (TOPROL-XL) 25 MG 24 hr tablet Take 1 tablet (25 mg total) by mouth.      mirtazapine  (REMERON ) 15 MG tablet Take 0.5 tablets (7.5 mg total) by mouth nightly. 45 tablet 3    mycophenolate  (CELLCEPT ) 250 mg capsule Take 2 capsules (500 mg total) by mouth two (2) times a day. 120 capsule 11    pen needle, diabetic (ULTICARE PEN NEEDLE) 32 gauge x 5/32 (4 mm) Ndle USE WITH INSULIN  PEN 4 TIMES DAILY AS DIRECTED 100 each 1    pen needle, diabetic (ULTICARE PEN NEEDLE) 32 gauge x 5/32 (4 mm) Ndle Use with insulin  twice a day 200 each 1    polyethylene glycol (GOLYTELY ) 236-22.74-6.74 gram solution Take by mouth as directed per [facility] GI prep instructions,  for split bowel prep. 4000 mL 0    rosuvastatin  (CRESTOR ) 10 MG tablet Take 1 tablet (10 mg total) by mouth once daily 90 tablet 3    rosuvastatin  (CRESTOR ) 5 MG tablet Take 1 tablet (5 mg total) by mouth daily. 90 tablet 3    sildenafiL , pulm.hypertension, (REVATIO ) 20 mg tablet Take 2 tablets (40 mg total) by mouth Three (3) times a day. 180 tablet 6    tamsulosin  (FLOMAX ) 0.4 mg capsule Take 1 capsule (0.4 mg total) by mouth daily. 90 capsule 0     No current facility-administered medications for this visit.   [2] No Known Allergies

## 2023-10-25 MED FILL — SILDENAFIL (PULMONARY HYPERTENSION) 20 MG TABLET: ORAL | 30 days supply | Qty: 180 | Fill #3

## 2023-10-31 NOTE — Unmapped (Addendum)
 This pharmacist was notified by a technician that this patient has reported that they've missed 1 doses of their mycophenoalte.. I have reviewed the patient's medical record and message sent to clinic team for followup as they deem necessary  Approximate time spent: 0-5 minutes    Jeffrey Jeffrey Ward, PharmD, Clinical Specialty Pharmacist  Jeffrey Jeffrey Ward        Jeffrey Jeffrey Ward Refill Coordination Note    Specialty Medication(s) to be Shipped:   Transplant: mycophenolate  mofetil 250mg     Other medication(s) to be shipped: No additional medications requested for fill at this time    Specialty Medications not needed at this time: Transplant: cyclosporine  25mg  and cyclosporine  100mg      Jeffrey Jeffrey Ward, DOB: 1950/06/10  Phone: [phone] (home)       All above HIPAA information was verified with patient.     Was a Nurse, learning disability used for this call? No    Completed refill call assessment today to schedule patient's medication shipment from the [facility] and Home Delivery Jeffrey Ward  [phone]).  All relevant notes have been reviewed.     Specialty medication(s) and dose(s) confirmed: Regimen is correct and unchanged.   Changes to medications: Jeffrey Jeffrey Ward reports starting the following medications: vancomycin  Changes to insurance: No  New side effects reported not previously addressed with a pharmacist or physician: None reported  Questions for the pharmacist: No    Confirmed patient received a Conservation officer, historic buildings and a Surveyor, mining with first shipment. The patient will receive a drug information handout for each medication shipped and additional FDA Medication Guides as required.       DISEASE/MEDICATION-SPECIFIC INFORMATION        N/A    SPECIALTY MEDICATION ADHERENCE     Medication Adherence    Patient reported X missed doses in the last month: 1  Specialty Medication: mycophenolate  250 mg capsule (CELLCEPT )  Patient is on additional specialty medications: Yes  Patient Reported Additional Medication X Missed Doses in the Last Month: 0  Patient is on more than two specialty medications: Yes  Specialty Medication: cycloSPORINE  modified 100 MG capsule (Neoral )  Patient Reported Additional Medication X Missed Doses in the Last Month: 0  Specialty Medication: cycloSPORINE  modified 25 MG capsule (Neoral )  Support network for adherence: family member              Were doses missed due to medication being on hold? No    mycophenolate  250 mg capsule (CELLCEPT )  7 days of medicine on hand       REFERRAL TO PHARMACIST     Referral to the pharmacist: Not needed      [facility]     Shipping address confirmed in Epic.     Cost and Payment: Patient has a copay of $20.00. They are aware and have authorized the Jeffrey Ward to charge the credit card on file.    Delivery Scheduled: Yes, Expected medication delivery date: 11/02/2023.     Medication will be delivered via Same Day Courier to the prescription address in Epic WAM.    Jeffrey Jeffrey Ward Specialty and Home Delivery Jeffrey Ward  Specialty Technician

## 2023-11-02 MED FILL — MYCOPHENOLATE MOFETIL 250 MG CAPSULE: ORAL | 30 days supply | Qty: 120 | Fill #5

## 2023-11-16 MED FILL — LANTUS SOLOSTAR U-100 INSULIN 100 UNIT/ML (3 ML) SUBCUTANEOUS PEN: SUBCUTANEOUS | 107 days supply | Qty: 15 | Fill #0

## 2023-11-16 MED FILL — PEN NEEDLE, DIABETIC 32 GAUGE X 5/32" (4 MM): ORAL | 100 days supply | Qty: 200 | Fill #1

## 2023-11-22 NOTE — Unmapped (Signed)
 [name] Refill Coordination Note    Specialty Medication(s) to be Shipped:   Transplant: cyclosporine  25mg , cyclosporine  100mg , and mycophenolate  mofetil 250 mg    Other medication(s) to be shipped: No additional medications requested for fill at this time    Specialty Medications not needed at this time: N/A     Jeffrey Ward, DOB: September 23, 1950  Phone: [phone] (home)       All above HIPAA information was verified with patient.     Was a Nurse, learning disability used for this call? No    Completed refill call assessment today to schedule patient's medication shipment from the [facility]  [phone]).  All relevant notes have been reviewed.     Specialty medication(s) and dose(s) confirmed: Regimen is correct and unchanged.   Changes to medications: Jamiah reports no changes at this time.  Changes to insurance: No  New side effects reported not previously addressed with a pharmacist or physician: None reported  Questions for the pharmacist: No    Confirmed patient received a Conservation officer, historic buildings and a Surveyor, mining with first shipment. The patient will receive a drug information handout for each medication shipped and additional FDA Medication Guides as required.       DISEASE/MEDICATION-SPECIFIC INFORMATION        N/A    SPECIALTY MEDICATION ADHERENCE     Medication Adherence    Patient reported X missed doses in the last month: 0  Specialty Medication: mycophenolate  250 mg capsule (CELLCEPT )  Patient is on additional specialty medications: Yes  Additional Specialty Medications: cycloSPORINE  modified 25 MG capsule (Neoral )  Patient Reported Additional Medication X Missed Doses in the Last Month: 0  Patient is on more than two specialty medications: Yes  Specialty Medication: cycloSPORINE  modified 100 MG capsule (Neoral )  Patient Reported Additional Medication X Missed Doses in the Last Month: 0  Support network for adherence: family member Were doses missed due to medication being on hold? No    mycophenolate  250 mg capsule (CELLCEPT ) 4 days of medicine on hand   cycloSPORINE  modified 100 MG capsule (Neoral ) 4 days of medicine on hand   cycloSPORINE  modified 25 MG capsule (Neoral ) 4 days of medicine on hand       REFERRAL TO PHARMACIST     Referral to the pharmacist: Not needed      [facility]     Shipping address confirmed in Epic.     Cost and Payment: Patient has a copay of $157.13 . They are aware and have authorized the pharmacy to charge the credit card on file.    Delivery Scheduled: Yes, Expected medication delivery date: 11/24/2023.     Medication will be delivered via Same Day Courier to the prescription address in Epic WAM.    Nelida Winfred [facility]  Specialty Technician

## 2023-11-23 NOTE — Unmapped (Signed)
 Sent mychart message asking pt to please contact this tpa re scheduling annual appt by [employer] or phone call. Left call-back number. 1st call.

## 2023-11-24 MED FILL — CYCLOSPORINE MODIFIED 100 MG CAPSULE: ORAL | 30 days supply | Qty: 90 | Fill #2

## 2023-11-24 MED FILL — MYCOPHENOLATE MOFETIL 250 MG CAPSULE: ORAL | 30 days supply | Qty: 120 | Fill #6

## 2023-11-24 MED FILL — CYCLOSPORINE MODIFIED 25 MG CAPSULE: ORAL | 30 days supply | Qty: 90 | Fill #2

## 2023-12-18 NOTE — Unmapped (Signed)
 [facility] Refill Coordination Note    Specialty Medication(s) to be Shipped:   Transplant: cyclosporine  25mg , cyclosporine  100mg , and mycophenolate  mofetil 250mg     Other medication(s) to be shipped: No additional medications requested for fill at this time    Specialty Medications not needed at this time: N/A     Jeffrey Ward, DOB: 1950/12/21  Phone: [phone] (home)       All above HIPAA information was verified with patient.     Was a Nurse, learning disability used for this call? No    Completed refill call assessment today to schedule patient's medication shipment from the [facility]  [phone]).  All relevant notes have been reviewed.     Specialty medication(s) and dose(s) confirmed: Regimen is correct and unchanged.   Changes to medications: Kahari reports no changes at this time.  Changes to insurance: No  New side effects reported not previously addressed with a pharmacist or physician: None reported  Questions for the pharmacist: No    Confirmed patient received a Conservation officer, historic buildings and a Surveyor, mining with first shipment. The patient will receive a drug information handout for each medication shipped and additional FDA Medication Guides as required.       DISEASE/MEDICATION-SPECIFIC INFORMATION        N/A    SPECIALTY MEDICATION ADHERENCE     Medication Adherence    Patient reported X missed doses in the last month: 0  Specialty Medication: cycloSPORINE  modified 25 MG capsule (Neoral )  Patient is on additional specialty medications: Yes  Additional Specialty Medications: cycloSPORINE  modified 100 MG capsule (Neoral )  Patient Reported Additional Medication X Missed Doses in the Last Month: 0  Patient is on more than two specialty medications: Yes  Specialty Medication: mycophenolate  250 mg capsule (CELLCEPT )  Patient Reported Additional Medication X Missed Doses in the Last Month: 0  Support network for adherence: family member Were doses missed due to medication being on hold? No    cycloSPORINE  modified 100 MG capsule (Neoral )  4 days of medicine on hand   cycloSPORINE  modified 25 MG capsule (Neoral ) 3 days of medicine on hand   mycophenolate  250 mg capsule (CELLCEPT ) 3 days of medicine on hand       REFERRAL TO PHARMACIST     Referral to the pharmacist: Not needed      [facility]     Shipping address confirmed in Epic.     Cost and Payment: Patient has a copay of $157.13. They are aware and have authorized the pharmacy to charge the credit card on file.    Delivery Scheduled: Yes, Expected medication delivery date: 12/19/2023.     Medication will be delivered via Same Day Courier to the prescription address in Epic WAM.    Nelida Winfred [facility]  Specialty Technician

## 2023-12-19 MED FILL — CYCLOSPORINE MODIFIED 100 MG CAPSULE: ORAL | 30 days supply | Qty: 90 | Fill #3

## 2023-12-19 MED FILL — CYCLOSPORINE MODIFIED 25 MG CAPSULE: ORAL | 30 days supply | Qty: 90 | Fill #3

## 2023-12-19 MED FILL — MYCOPHENOLATE MOFETIL 250 MG CAPSULE: ORAL | 30 days supply | Qty: 120 | Fill #7

## 2024-01-03 NOTE — Unmapped (Signed)
 Sent mychart message asking pt to please contact this tpa re scheduling annual appt by [employer] or phone call. Left call-back number. 2nd call.

## 2024-01-09 NOTE — Progress Notes (Signed)
 Jeffrey Ward has been contacted in regards to their refill of cycloSPORINE  modified 100 MG capsule (Neoral ) and cycloSPORINE  modified 25 MG capsule (Neoral ) and  mycophenolate  250 mg capsule (CELLCEPT ). At this time, they have declined refill due to patient having too many  doses remaining. Refill assessment call date has been updated per the patient's request.

## 2024-01-31 NOTE — Progress Notes (Signed)
 The [facility] has made a second and final attempt to reach this patient to refill the following medication:cycloSPORINE  modified 100 MG capsule (Neoral ),cycloSPORINE  modified 25 MG capsule (Neoral ) and  mycophenolate  250 mg capsule (CELLCEPT ).      We have left voicemails on the following phone numbers: [phone], have sent a MyChart message, and have sent a text message to the following phone numbers: [phone].    Dates contacted: 01/23/2024- 01/31/2024  Last scheduled delivery: 12/19/2023    The patient may be at risk of non-compliance with this medication. The patient should call the [facility] at [phone]  Option 4, then Option 4: Infectious Disease, Transplant to refill medication.    Nelida Guan   [facility]

## 2024-02-05 MED FILL — LANTUS SOLOSTAR U-100 INSULIN 100 UNIT/ML (3 ML) SUBCUTANEOUS PEN: SUBCUTANEOUS | 107 days supply | Qty: 15 | Fill #1

## 2024-02-21 MED ORDER — SILDENAFIL (PULMONARY HYPERTENSION) 20 MG TABLET
ORAL_TABLET | Freq: Three times a day (TID) | ORAL | 6 refills | 30.00000 days
Start: 2024-02-21 — End: ?

## 2024-02-28 NOTE — Progress Notes (Signed)
 The [facility] has made a third and final attempt to reach this patient to refill the following medication:cyclosporine , mycophenolate .      We have left voicemails on the following phone numbers: [phone], have sent a MyChart message, and have sent a text message to the following phone numbers: [phone].    Dates contacted: 11/11, 11/19, 12/17  Last scheduled delivery: 10/7 for 30ds    The patient may be at risk of non-compliance with this medication. The patient should call the [facility] at [phone]  Option 4, then Option 4: Infectious Disease, Transplant to refill medication.    Ronal FORBES Reusing, PharmD   [facility] Specialty Pharmacist

## 2024-02-29 MED ORDER — SILDENAFIL (PULMONARY HYPERTENSION) 20 MG TABLET
ORAL_TABLET | Freq: Three times a day (TID) | ORAL | 6 refills | 30.00000 days | Status: CP
Start: 2024-02-29 — End: ?

## 2024-03-01 ENCOUNTER — Ambulatory Visit: Admit: 2024-03-01 | Discharge: 2024-03-02 | Payer: Medicare (Managed Care)

## 2024-03-01 DIAGNOSIS — R0600 Dyspnea, unspecified: Principal | ICD-10-CM

## 2024-03-01 DIAGNOSIS — I2721 Secondary pulmonary arterial hypertension: Principal | ICD-10-CM

## 2024-03-01 DIAGNOSIS — Z944 Liver transplant status: Principal | ICD-10-CM

## 2024-03-01 DIAGNOSIS — K766 Portal hypertension: Principal | ICD-10-CM

## 2024-03-01 MED ORDER — SILDENAFIL (PULMONARY HYPERTENSION) 20 MG TABLET
ORAL_TABLET | Freq: Three times a day (TID) | ORAL | 11 refills | 30.00000 days | Status: CP
Start: 2024-03-01 — End: ?
  Filled 2024-03-01: qty 180, 30d supply, fill #0

## 2024-03-01 NOTE — Progress Notes (Signed)
 [employer]  at [facility]   Pulmonary Hypertension Program                                             HILARIO Lynwood Abu, MD--Director (Pulmonary)     Heron CROME. Janie, MD--Associate Director (Pulmonary)    Ole Constant, MD (Pulmonary)    Alm Chatters, MD (Cardiology)         Leita Eans, RN Nurse Coordinator     Hardin Kubas, RN Nurse Coordinator    [phone] office - [phone] fax      INITIAL HPI:    [name]  is a 73 y.o. male  patient referred by Jeffrey Carlin Shove, MD for work up and evaluation for pulmonary hypertension.    PAH Treatment History:  Sildenafil  40 mg tid since post liver txplt hospitalization.    Pt describes a history of recent liver txplt in 09/2016.  Post op was noted to have previously unrecognized PAH and RV failure, treated initially with inhaled iloprost while on vent support and then transitioned to sildenafil .  He had a long post op course requiring a stay in rehab of about 2 weeks prior to discharge.   His post op course was further complicated by AKI (on CRRT then had renal recovery), afib, bradycardia requiring pacemaker placement.  CTA negative for PE post op.      No LE edema currently.      He continues on sildenafil  40 tid and is receiving from [facility] pharmacy.      He is no longer on warfarin.    No syncope or near syncope.      Denies dyspnea, syncope or near syncope.      Had recent hip fx s/p surgery end of Sept 2019.  Currently undergoing rehab.      Has been having URI symptoms last two day.  Antibx was called in by his PCP.  No fevers.      INTERVAL HPI 11/19/19:    Patient reports doing well.  No changes since last visit aside from feeling a little more dyspneic with activities.  He has gained weight and thinks may be related to that.    No syncope or near syncope.  No LE edema noted.    Continues on sildenafil .      Patient refuses to get COVID vaccine.      INTERVAL HPI 07/31/20:    Patient continues to feel weakness and some dypsnea, but not changed compared to prior visit.    No LE edema noted.  No chest pain.     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    No syncope or near syncope.     INTERVAL HPI 12/01/20:    Patient here for follow up. Continues with same level of dyspnea he has had since liver tsplt.      No LE edema.     Denies syncope.      Continues on sildenafil  for POPH.    Is overdue for echo--states he was not called to schedule after it was ordered at last visit.    INTERVAL HPI 04/06/21:    States he is doing well in terms of exertional capacity.  No syncope or near syncope.  No LE edema noted.    Asks for urology referral due to  concern for BPH.    Continues on sildenafil  with no issues for POPH.      Overdue for follow up with hepatology.      INTERVAL HPI 08/06/21:    Patient here for follow up.      No new issues.  Denies syncope or LE edema.    Remains on sildenafil  for POPH.    He has followed up with hepatology since last visit.      No changes in exertional capacity noted by patient.    INTERVAL HPI 12/07/21:    Here for follow up.  No dyspnea.    Denies syncope or near syncope.     No LE edema.    Echocardiogram has not been done yet.  Patient is not sure if he called to schedule or not.      Continues on sildenafil .      INTERVAL HPI 06/07/22:    Here for follow up visit.  Continues on sildenafil  40 mg tid.      No LE edema.  Denies dyspnea limiting his exertion.    No syncope or near syncope.      Still has not had echocardiogram.      INTERVAL HPI 03/01/24:    Here for follow up visit.  Continues on sildenafil  40 mg tid, though has been out for 2 weeks.  Rx was sent again.    Some mild ankle edema that is chronic.  Not limited by dyspnea.    No syncope or near syncope.      Still pending echocardiogram.      No issues with transplanted liver and continues to do well in that regard.    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      The patient's current NYHA functional class is Class 2 - comfortable at rest but have symptoms with ordinary physcial activity.     Will get follow up echo as this is overdue.  He was not called to schedule.  Will need to provide him number to schedule again.  Explained importance of the test to follow up status of PH and prevent hepatic congestion.      Overall he appears stable with no signs of RV failure clinically.    Pro BNP at next visit given he is off sildenafil  at this time.    EmPHasis 10 score: 21    Plan:          1) continue sildenafil  40 mg tid.      2) next visit in 6 months    3) echo in coming weeks-gave patient number for scheduling again    5) pro BNP at next visit.      Subjective:      Past medical history is significant for:   Past Medical History:   Diagnosis Date    Atrial fibrillation    (CMS-HCC)     Basal cell carcinoma     Cancer    (CMS-HCC)     Cataract     Chronic kidney disease 09/15/16    Cirrhosis    (CMS-HCC)     Depression     Diabetes (CMS-HCC)     GERD (gastroesophageal reflux disease)     Headache     Heart disease     Hepatitis C 07/17/2012    History of transfusion     Liver disease  Low back pain 07/17/2012    Osteoporosis     Peptic ulceration 2017    Varices, esophageal (CMS-HCC)       Family History   Problem Relation Age of Onset    Hypertension Mother     Cancer Mother     Heart disease Mother     Heart disease Father     Cirrhosis Neg Hx     Liver cancer Neg Hx     Anemia Neg Hx     Diabetes Neg Hx     Kidney disease Neg Hx     Obesity Neg Hx     Thyroid disease Neg Hx     Osteoporosis Neg Hx     Coronary artery disease Neg Hx     Anesthesia problems Neg Hx     Basal cell carcinoma Neg Hx     Squamous cell carcinoma Neg Hx       Social History     Socioeconomic History    Marital status: Widowed   Tobacco Use    Smoking status: Never     Passive exposure: Past    Smokeless tobacco: Never    Tobacco comments:     Smoked in high school for about 5 years.    Substance and Sexual Activity    Alcohol use: No    Drug use: No    Sexual activity: Yes     Partners: Female     Birth control/protection: None   Other Topics Concern    Exercise No    Living Situation No     Social Drivers of Health     Food Insecurity: No Food Insecurity (03/28/2023)    Received from [facility]    Hunger Vital Sign     Within the past 12 months, you worried that your food would run out before you got the money to buy more.: Never true     Within the past 12 months, the food you bought just didn't last and you didn't have money to get more.: Never true   Tobacco Use: Low Risk  (09/29/2023)    Received from [facility]    Patient History     Smoking Tobacco Use: Never     Smokeless Tobacco Use: Never   Transportation Needs: No Transportation Needs (03/28/2023)    Received from [facility] - Transportation     In the past 12 months, has lack of transportation kept you from medical appointments or from getting medications?: No     Lack of Transportation (Non-Medical): No   Alcohol Use: Not At Risk (03/28/2023)    Received from [facility]    AUDIT-C     Q1: How often do you have a drink containing alcohol?: Never     Q2: How many drinks containing alcohol do you have on a typical day when you are drinking?: Patient does not drink     Q3: How often do you have six or more drinks on one occasion?: Never   Housing: Unknown (03/28/2023)    Received from [facility]    Housing Stability Vital Sign     In the last 12 months, was there a time when you were not able to pay the mortgage or rent on time?: No     At any time in the past 12 months, were you homeless or living in a shelter (including now)?: No  Utilities: Not At Risk (03/28/2023)    Received from [facility] Utilities     Threatened with loss of utilities: No   Financial Resource Strain: Low Risk  (11/11/2022)    Received from [facility]    Overall Financial Resource Strain (CARDIA)     Difficulty of Paying Living Expenses: Not hard at all      Past Surgical History:   Procedure Laterality Date    ANKLE SURGERY      BACK SURGERY      BACK SURGERY      CARDIAC SURGERY      pacemaker    CHG US  GUIDE, TISSUE ABLATION N/A 01/22/2016    Procedure: ULTRASOUND GUIDANCE FOR, AND MONITORING OF, PARENCHYMAL TISSUE ABLATION;  Surgeon: Alm Dasie Police, MD;  Location: MAIN OR [facility];  Service: Transplant    EYE SURGERY  2016    FRACTURE SURGERY  08/05/89    IR EMBOLIZATION ORGAN ISCHEMIA, TUMORS, INFAR  06/16/2016    IR EMBOLIZATION ORGAN ISCHEMIA, TUMORS, INFAR 06/16/2016 Jed Cap, MD IMG VIR [facility]    PR COLON CA SCRN NOT HI RSK IND N/A 02/27/2015    Procedure: COLOREC CNCR SCR;COLNSCPY NO;  Surgeon: Krystal Jona Matter, MD;  Location: [facility]URES [facility];  Service: Gastroenterology    PR COLONOSCOPY FLX DX W/COLLJ SPEC WHEN PFRMD N/A 02/06/2023    Procedure: COLONOSCOPY, FLEXIBLE, PROXIMAL TO SPLENIC FLEXURE; DIAGNOSTIC, W/WO COLLECTION SPECIMEN BY BRUSH OR WASH;  Surgeon: Charlett Dutton, MD;  Location: GI PROCEDURES [facility];  Service: Gastroenterology    PR ENDOSCOPIC ULTRASOUND EXAM N/A 02/27/2015    Procedure: UGI ENDO; W/ENDO ULTRASOUND EXAM INCLUDES ESOPHAGUS, STOMACH, &/OR DUODENUM/JEJUNUM;  Surgeon: Krystal Jona Matter, MD;  Location: GI PROCEDURES [facility];  Service: Gastroenterology    PR ERCP BALLOON DILATE BILIARY/PANC DUCT/AMPULLA EA N/A 02/10/2017    Procedure: ERCP;WITH TRANS-ENDOSCOPIC BALLOON DILATION OF BILIARY/PANCREATIC DUCT(S) OR OF AMPULLA, INCLUDING SPHINCTERECTOMY, WHEN PERFOREMD,EACH DUCT ([id number]);  Surgeon: Chauncey Glendia Pfeiffer, MD;  Location: GI PROCEDURES [facility];  Service: Gastroenterology    PR ERCP REMOVE FOREIGN BODY/STENT BILIARY/PANC DUCT N/A 02/10/2017    Procedure: ENDOSCOPIC RETROGRADE CHOLANGIOPANCREATOGRAPHY (ERCP); W/ REMOVAL OF FOREIGN BODY/STENT FROM BILIARY/PANCREATIC DUCT(S);  Surgeon: Chauncey Glendia Pfeiffer, MD;  Location: GI PROCEDURES [facility];  Service: Gastroenterology    PR ERCP REMOVE FOREIGN BODY/STENT BILIARY/PANC DUCT N/A 06/29/2017    Procedure: ENDOSCOPIC RETROGRADE CHOLANGIOPANCREATOGRAPHY (ERCP); W/ REMOVAL OF FOREIGN BODY/STENT FROM BILIARY/PANCREATIC DUCT(S);  Surgeon: Krystal Jona Matter, MD;  Location: GI PROCEDURES [facility];  Service: Gastroenterology    PR ERCP STENT PLACEMENT BILIARY/PANCREATIC DUCT N/A 11/29/2016    Procedure: ENDOSCOPIC RETROGRADE CHOLANGIOPANCREATOGRAPHY (ERCP); WITH PLACEMENT OF ENDOSCOPIC STENT INTO BILIARY OR PANCREATIC DUCT;  Surgeon: Olam Earnie Henle, MD;  Location: [facility]URES [facility];  Service: Gastroenterology    PR ERCP STENT PLACEMENT BILIARY/PANCREATIC DUCT N/A 04/26/2017    Procedure: ENDOSCOPIC RETROGRADE CHOLANGIOPANCREATOGRAPHY (ERCP); WITH PLACEMENT OF ENDOSCOPIC STENT INTO BILIARY OR PANCREATIC DUCT;  Surgeon: Chauncey Glendia Pfeiffer, MD;  Location: GI PROCEDURES [facility];  Service: Gastroenterology    PR ERCP,W/REMOVAL STONE,BIL/PANCR DUCTS N/A 11/29/2016    Procedure: ERCP; W/ENDOSCOPIC RETROGRADE REMOVAL OF CALCULUS/CALCULI FROM BILIARY &/OR PANCREATIC DUCTS;  Surgeon: Olam Earnie Henle, MD;  Location: GI PROCEDURES [facility];  Service: Gastroenterology    PR ERCP,W/REMOVAL STONE,BIL/PANCR DUCTS N/A 02/10/2017    Procedure: ERCP; W/ENDOSCOPIC RETROGRADE REMOVAL OF CALCULUS/CALCULI FROM BILIARY &/OR PANCREATIC DUCTS;  Surgeon: Chauncey Glendia Pfeiffer,  MD;  Location: GI PROCEDURES [facility];  Service: Gastroenterology    PR ERCP,W/REMOVAL STONE,BIL/PANCR DUCTS N/A 04/26/2017    Procedure: ERCP; W/ENDOSCOPIC RETROGRADE REMOVAL OF CALCULUS/CALCULI FROM BILIARY &/OR PANCREATIC DUCTS;  Surgeon: Chauncey Glendia Pfeiffer, MD;  Location: GI PROCEDURES [facility];  Service: Gastroenterology    PR ERCP,W/REMOVAL STONE,BIL/PANCR DUCTS N/A 06/29/2017    Procedure: ERCP; W/ENDOSCOPIC RETROGRADE REMOVAL OF CALCULUS/CALCULI FROM BILIARY &/OR PANCREATIC DUCTS;  Surgeon: Krystal Jona Matter, MD;  Location: GI PROCEDURES [facility];  Service: Gastroenterology    PR ESOPHAGOSCP RIG TRANSORAL HYPOPHARYNX CRV ESOPH N/A 04/04/2018    Procedure: ESOPHAGOSCOPY, RIGID, TRANSORAL WITH DIVERTICULECTOMY OF HYPOPHARYNX OR CERVICAL ESOPHAGUS, WITH CRICOPHARYNGEAL MYOTOMY, INCLUDES USE OF TELESCOPE OR OPERATING MICROSCOPE AND REPAIR, WHEN PERFORMED;  Surgeon: Genell Deno Fairly, MD;  Location: MAIN OR [facility];  Service: ENT    PR INSER HEART TEMP PACER ONE CHMBR N/A 10/02/2016    Procedure: Tempoarary Pacemaker Insertion;  Surgeon: Faisal Fiazuddin Syed, MD;  Location: [facility];  Service: Cardiology    PR LAP,DIAGNOSTIC ABDOMEN N/A 01/22/2016    Procedure: Laparoscopy, Abdomen, Peritoneum, & Omentum, Diagnostic, W/Wo Collection Specimen(S) By Brushing Or Washing;  Surgeon: Alm Dasie Police, MD;  Location: MAIN OR [facility];  Service: Transplant    PR LARYNGOPLASTY MEDIALIZATION WYNONIA N/A 11/07/2018    Procedure: R31 LARYNGOPLASTY, MEDIALIZATION, UNILATERAL;  Surgeon: Genell Deno Fairly, MD;  Location: MAIN OR [facility];  Service: ENT    PR PLACE PERCUT GASTROSTOMY TUBE N/A 11/17/2016    Procedure: UGI ENDO; W/DIRECTED PLCMT PERQ GASTROSTOMY TUBE;  Surgeon: Artist Jayson Leather, MD;  Location: GI PROCEDURES [facility]  Service: Gastroenterology    PR TRACHEOSTOMY, PLANNED N/A 09/29/2016    Procedure: TRACHEOSTOMY PLANNED (SEPART PROC);  Surgeon: Curtistine Mira Dunnings, MD;  Location: MAIN OR [facility];  Service: Trauma    PR TRANSCATH INSERT OR REPLACE LEADLESS PM VENTR N/A 10/11/2016    Procedure: Pacemaker Implant/Replace Leadless;  Surgeon: Faisal Fiazuddin Syed, MD;  Location: [facility];  Service: Cardiology    PR TRANSPLANT LIVER,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    Procedure: Liver Allotransplantation; Orthotopic, Partial Or Whole, From Cadaver Or Living Donor, Any Age; Surgeon: Barkley Coni Stallion, MD;  Location: MAIN OR [facility];  Service: Transplant    PR TRANSPLANT,PREP DONOR LIVER, WHOLE N/A 09/15/2016    Procedure: Rudolfo Std Prep Cad Donor Whole Liver Gft Prior Tnsplnt,Inc Chole,Diss/Rem Surr Tissu Wo Triseg/Lobe Splt;  Surgeon: Barkley Coni Stallion, MD;  Location: MAIN OR [facility];  Service: Transplant    PR UPPER GI ENDOSCOPY,BIOPSY N/A 07/17/2012    Procedure: UGI ENDOSCOPY; WITH BIOPSY, SINGLE OR MULTIPLE;  Surgeon: Ozell LELON Ash, MD;  Location: GI PROCEDURES [facility];  Service: Gastroenterology    PR UPPER GI ENDOSCOPY,DIAGNOSIS N/A 02/04/2014    Procedure: UGI ENDO, INCLUDE ESOPHAGUS, STOMACH, & DUODENUM &/OR JEJUNUM; DX W/WO COLLECTION SPECIMN, BY BRUSH OR WASH;  Surgeon: Sandrea CHRISTELLA Mood, MD;  Location: GI PROCEDURES [facility];  Service: Gastroenterology    PR UPPER GI ENDOSCOPY,LIGAT VARIX N/A 11/05/2013    Procedure: UGI ENDO; BEULAH LIG ESOPH &/OR GASTRIC VARICES;  Surgeon: Sandrea CHRISTELLA Mood, MD;  Location: GI PROCEDURES [facility];  Service: Gastroenterology        Objective:   Objective   Review of systems is significant for:     As stated in the HPI, remainder of 12 system review is negative.      No Known Allergies   Current Outpatient Medications   Medication Sig Dispense Refill    alendronate  (  FOSAMAX ) 70 MG tablet Take 1 tablet every Sunday on empty stomach with a full glass of water. Do not lie down or eat/drink anything else for the next 30 min. 12 tablet 3    blood-glucose meter (GLUCOSE MONITORING KIT) kit Use as instructed 1 each 0    cholecalciferol , vitamin D3, 125 mcg (5,000 unit) tablet Take 1 tablet (125 mcg total) by mouth daily.      cycloSPORINE  modified (NEORAL ) 100 MG capsule Take 2 capsules (200 mg total) by mouth daily AND 1 capsule (100 mg total) nightly. 270 capsule 3    cycloSPORINE  modified (NEORAL ) 25 MG capsule Take 3 capsules (75 mg total) by mouth nightly (with a 100mg  capsule). For a total dose of 200 mg in the morning , 175mg  every evening 90 capsule 11    DULoxetine  (CYMBALTA ) 30 MG capsule Take 2 capsules (60 mg total) by mouth nightly. 60 capsule 11    insulin  glargine (LANTUS  SOLOSTAR U-100 INSULIN ) 100 unit/mL (3 mL) injection pen Inject 0.17 mL (17 Units total) under the skin at bedtime. 15.3 mL 3    insulin  glargine (LANTUS  SOLOSTAR U-100 INSULIN ) 100 unit/mL (3 mL) injection pen Inject 0.14 mL (14 Units total) under the skin at bedtime. 17 mL 2    melatonin 3 mg Tab Take 1 tablet (3 mg total) by mouth nightly. 30 tablet 0    metoprolol succinate (TOPROL-XL) 25 MG 24 hr tablet Take 1 tablet (25 mg total) by mouth.      mirtazapine  (REMERON ) 15 MG tablet Take 0.5 tablets (7.5 mg total) by mouth nightly. 45 tablet 3    mycophenolate  (CELLCEPT ) 250 mg capsule Take 2 capsules (500 mg total) by mouth two (2) times a day. 120 capsule 11    pen needle, diabetic (ULTICARE PEN NEEDLE) 32 gauge x 5/32 (4 mm) Ndle USE WITH INSULIN  PEN 4 TIMES DAILY AS DIRECTED 100 each 1    pen needle, diabetic (ULTICARE PEN NEEDLE) 32 gauge x 5/32 (4 mm) Ndle Use with insulin  twice a day 200 each 1    polyethylene glycol (GOLYTELY ) 236-22.74-6.74 gram solution Take by mouth as directed per [facility] GI prep instructions, for split bowel prep. 4000 mL 0    rosuvastatin  (CRESTOR ) 10 MG tablet Take 1 tablet (10 mg total) by mouth once daily 90 tablet 3    rosuvastatin  (CRESTOR ) 5 MG tablet Take 1 tablet (5 mg total) by mouth daily. 90 tablet 3    sildenafiL , pulm.hypertension, (REVATIO ) 20 mg tablet Take 2 tablets (40 mg total) by mouth Three (3) times a day. 180 tablet 6    tamsulosin  (FLOMAX ) 0.4 mg capsule Take 1 capsule (0.4 mg total) by mouth daily. 90 capsule 0     No current facility-administered medications for this visit.        Physical Exam:    height is 175.3 cm (5' 9.02) and weight is 66 kg (145 lb 9.6 oz). His temporal temperature is 36.3 ??C (97.3 ??F). His blood pressure is 152/63 (abnormal) and his pulse is 69. His oxygen  saturation is 99%. General Appearance:    Alert, cooperative, no distress, appears stated age   Head:    Normocephalic, without obvious abnormality, atraumatic   Eyes:    PERRL, conjunctiva clear, EOM's intact    Oropharynx:   No lesions   Neck:   Supple, trachea midline, no adenopathy;    No JVD   Lungs:     Good breath sounds b/l, no crackles or wheezes, respirations  unlabored   Chest Wall:    No tenderness or deformity    Heart:    Regular rate and rhythm, S1 and S2 normal, no murmur, rub   or gallop   Abdomen:     Soft, non-tender, normal bowel sounds, no masses, no organomegaly.  + G tube in place   Extremities:   Extremities normal, no cyanosis, clubbing, or edema   Pulses:   2+ and symmetric all extremities   Skin:   No rashes or lesions   Lymph nodes:   No cervical or supraclavicular adenopathy   Neurologic:   No focal neurological deficits       Diagnostic Review:    Echo 04/21/17:    Left Ventricle The left ventricle is normal in size with normal wall thickness.   The left ventricular systolic function is normal.   The ejection fraction is visually estimated at 65 to 70%.   There is impaired left ventricular relaxation with normal left ventricular filling pressure consistent with grade I diastolic dysfunction.   Mitral Valve The mitral valve leaflets are normal, with normal leaflet mobility.   There is no significant mitral regurgitation by color and continuous wave Doppler imaging.   Left Atrium The left atrium is dilated.   Aortic Valve The aortic valve is poorly visualized with probably normal excursion.   There is no significant aortic regurgitation by color and continuous wave Doppler imaging.  There is no evidence of a significant transvalvular gradient.   - Peak transvalvular velocity: 1 m/sec   Ascending Aorta The aorta is normal in size in the visualized segments.   Pulmonary Artery The pulmonary artery is not well visualized.   Pulmonic Valve The pulmonic valve is poorly visualized, but probably normal.   There is no significant pulmonic regurgitation by color and continuous wave Doppler imaging.   There is no evidence of a significant transvalvular gradient.   - Peak transvalvular velocity: 0.9 m/sec   Right Ventricle The right ventricle is normal in size with normal wall thickness.   The right ventricular systolic function is normal.   Tricuspid Valve The tricuspid valve leaflets are normal, with normal leaflet mobility.   There is trivial tricuspid regurgitation by color and continuous wave Doppler imaging.   - The Doppler signal is suboptimal and pulmonary artery systolic pressure cannot be accurately estimated.   Right Atrium The right atrium is normal in size.   IVC/SVC The IVC diameter is <=21 mm with >50% decrease in size with inspiration suggesting normal right atrial pressure (0-5 mm Hg).   Pericardium There is no evidence of a significant pericardial effusion.         V/Q scan 01/11/17:    Impression     Normal ventilation and perfusion scan.         CT scan PE protocol performed on 09/2016 post op revealed:    Impression     No CT evidence for acute pulmonary embolism, as clinically questioned.    Moderate bilateral pleural effusions similar to prior CT dated 01/30/2016 with slightly increased atelectasis of the adjacent lung parenchyma in the bilateral lower lobes, left greater than right.    Addendum (Dr.Altun dictating 09/27/2016 08:35 AM) Focal contour bulging of the liver adjacent to the diaphragm and right kidney is noted which is not well evaluated due to the presence of streak artifacts.          Pulmonary Function Test performed on 11/24/15 show:  Consistent with normal spirometry and normal DLCO    6 Minute Walk Test performed on 01/06/17:         Echo performed on 09/27/16:    Echocardiographic Findings     Left Ventricle The left ventricle is normal in size with normal wall thickness.   The left ventricular systolic function is normal.   The ejection fraction is visually estimated at 60 to 65%. Left ventricular diastolic function cannot be accurately assessed.      Left Atrium The left atrium is normal in size.      Right Ventricle The right ventricle is moderately dilated.   The right ventricular systolic function is decreased.      Tricuspid Valve The tricuspid valve is not well visualized.   There is mild tricuspid regurgitation by color and continuous wave Doppler imaging.   The regurgitation jet is directed toward the septum.   The pulmonary artery systolic pressure is severely elevated.   - Peak tricuspid regurgitant velocity: 4.6 m/sec  - Estimated pulmonary artery systolic pressure: 80 + RA pressure mmHg      Right Atrium The right atrium is poorly visualized but appears dilated      IVC/SVC Mechanical ventilation precludes the ability to accurately assess right atrial pressure.      Pericardium There is a small anterior pericardial effusion.          [facility] ICU Catheterization performed on date 09/22/16:     Wedge pressure initially looks to be 15-16 but with further balloon deflation this clearly decreases further to 8 mmHg with very good quality tracing. At that time mPAP 50, CVP 9, CO by CCO measurements 4.1/CI 2.2, PVR approximating 10 WU.

## 2024-03-01 NOTE — Patient Instructions (Signed)
 Thank you for visiting the [facility] Pulmonary Hypertension Clinic.    If you have any questions or concerns, please call     H. Lynwood Abu, MD    Leita Eans, RN  Hardin Kubas, RN    Pulmonary Hypertension nurse coordinators:    [phone]

## 2024-03-04 NOTE — Progress Notes (Signed)
 I saw and evaluated the patient, participating in the key portions of the service.  I reviewed the resident???s note.  I agree with the resident???s findings and plan.     I reviewed with the patient his urinary symptoms and postvoid residual in clinic today. We reviewed his response to tamsulosin  medication. We reviewed his prior PSA result. He is interested in repeat PSA today. We provided information regarding behavioral modifications for urination. We reviewed the importance of better diabetes and sugar control to help with overactive urinary complaints. We reviewed his recent urinalysis testings which did show microscopic blood in the urine. I discussed evaluation including imaging of the kidneys and bladder and local cystoscopy procedure which would both allow assessment of his prostate and bladder given his ongoing urinary complaints as well as complete hematuria evaluation to assess for potential malignancy. The patient wishes to defer at this time. He will plan to return to clinic with Jeffrey Ward in 12 months for follow-up.    ADDENDUM:            Patient messaged about results with recommendation again for hematuria evaluation as well as to repeat PSA in 6 months for reassessment.    Jeffrey SQUIBB Karmin Kasprzak, MD      ANALYSIS AND PLAN:  This is a 73 y.o. 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 I reviewed with the patient today his urinary complaints, symptoms and post-void residual. His PVR is 36 ml and IPSS 19(4). The patient states symptoms have remained unchanged and he is still not interested in more invasive treatment at this point.  I reviewed the patients response to Flomax  medication, which he believed was not helping him. This medication was discontinued on 03/01/24. Discussed his prior PSA results from 6 years ago. We reviewed the utility of repeat PSA testing today in clinic, will obtain PSA today. I discussed contributing factor of poorly controlled diabetes to worsened, progressive lower urinary tract symptoms and erectile dysfunction. We discussed the importance of behavioral modifications including timed and double voiding, avoiding constipation episodes, shifting fluid intake earlier in the day to decrease nocturia at night.     Per recent UA patient meets criteria for microscopic hematuria and therefore recommendations are to obtain RUS and cystoscopy. Patient would like to defer. We ordered PSA today and he will follow-up in approximately 12 months with Jeffrey Ward to assess results and necessity for further workup. We will be happy to see him back anytime sooner if symptoms arise.      Jeffrey Ophelia PARAS, MD  [address]  [facility] [place],  [place] [address]    CHIEF COMPLAINT: History of BPH and bothersome lower urinary tract symptoms here for follow-up    BRIEF HISTORY OF PRESENT ILLNESS:  Jeffrey Ward is very pleasant 73 y.o. male who returns for the evaluation of bothersome lower urinary tract symptoms and BPH. The patient notes urinary complaints for a prolonged amount of time. He has previously been maintained on medication therapy including Tamsulosin  0.4 mg at one point which was discontinued recently on 03/01/24 for unknown reasons. His primary urinary complaints include intermittency, frequency/urgency, weak urinary stream, and nocturia 8-10x that have worsened over the years. He  occasionally feels incomplete bladder emptying with small voids. He has severe urge incontinence at times. Urinary symptoms started since he injured his back in 2020.  The patient reports a history of pyelonephritis and is not on any medical treatment for this. He is followed by nephrology. Patient denies history of nephrolithiasis. He also complains of erectile dysfunction. Since he was last seen, the patient notes no change in his urinary symptoms within the last 6 months. His most bothersome urinary complaints are weak stream and nocturia. He also reports sensation of incomplete emptying and frequency. He is not interested in surgical intervention. The patient denies a history of dysuria, hematuria or urinary tract infection. He has never had a prostate biopsy in the past. He has no family history of prostate cancer. He does not have a history of sleep apnea or significant snoring. The patient has no history of fevers, chills, nausea, vomiting, chest pain or shortness of breath.     PAST MEDICAL HISTORY:     Past Medical History:   Diagnosis Date    Atrial fibrillation    (CMS-HCC)     Basal cell carcinoma     Cancer    (CMS-HCC)     Cataract     Chronic kidney disease 09/15/16    Cirrhosis    (CMS-HCC)     Depression     Diabetes (CMS-HCC)     GERD (gastroesophageal reflux disease)     Headache     Heart disease     Hepatitis C 07/17/2012    History of transfusion     Liver disease     Low back pain 07/17/2012    Osteoporosis     Peptic ulceration 2017    Varices, esophageal (CMS-HCC)        PAST SURGICAL HISTORY:   Past Surgical History:   Procedure Laterality Date    ANKLE SURGERY      BACK SURGERY      BACK SURGERY      CARDIAC SURGERY      pacemaker    CHG US  GUIDE, TISSUE ABLATION N/A 01/22/2016    Procedure: ULTRASOUND GUIDANCE FOR, AND MONITORING OF, PARENCHYMAL TISSUE ABLATION;  Surgeon: Jeffrey Dasie Police, MD;  Location: MAIN OR [facility];  Service: Transplant    EYE SURGERY  2016    FRACTURE SURGERY  08/05/89    IR EMBOLIZATION ORGAN ISCHEMIA, TUMORS, INFAR  06/16/2016    IR EMBOLIZATION ORGAN ISCHEMIA, TUMORS, INFAR 06/16/2016 Jeffrey Cap, MD IMG VIR H&V [facility]    PR COLON CA SCRN NOT HI RSK IND N/A 02/27/2015    Procedure: COLOREC CNCR SCR;COLNSCPY NO;  Surgeon: Jeffrey Ward Jona Matter, MD;  Location: [facility];  Service: Gastroenterology    PR COLONOSCOPY FLX DX W/COLLJ SPEC WHEN PFRMD N/A 02/06/2023    Procedure: COLONOSCOPY, FLEXIBLE, PROXIMAL TO SPLENIC FLEXURE; DIAGNOSTIC, W/WO COLLECTION SPECIMEN BY BRUSH OR WASH;  Surgeon: Charlett Dutton, MD;  Location: GI PROCEDURES [facility];  Service: Gastroenterology    PR ENDOSCOPIC ULTRASOUND EXAM N/A 02/27/2015    Procedure: UGI ENDO; W/ENDO ULTRASOUND EXAM INCLUDES ESOPHAGUS, STOMACH, &/OR DUODENUM/JEJUNUM;  Surgeon: Jeffrey Ward Jona Matter, MD;  Location: GI PROCEDURES [facility];  Service: Gastroenterology    PR ERCP BALLOON DILATE BILIARY/PANC DUCT/AMPULLA EA N/A 02/10/2017    Procedure: ERCP;WITH TRANS-ENDOSCOPIC BALLOON DILATION OF BILIARY/PANCREATIC DUCT(S) OR OF AMPULLA, INCLUDING SPHINCTERECTOMY, WHEN PERFOREMD,EACH DUCT ([id number]);  Surgeon: Chauncey Glendia Pfeiffer, MD;  Location: GI PROCEDURES [facility];  Service: Gastroenterology    PR  ERCP REMOVE FOREIGN BODY/STENT BILIARY/PANC DUCT N/A 02/10/2017    Procedure: ENDOSCOPIC RETROGRADE CHOLANGIOPANCREATOGRAPHY (ERCP); W/ REMOVAL OF FOREIGN BODY/STENT FROM BILIARY/PANCREATIC DUCT(S);  Surgeon: Chauncey Glendia Pfeiffer, MD;  Location: [facility]EDURES [facility];  Service: Gastroenterology    PR ERCP REMOVE FOREIGN BODY/STENT BILIARY/PANC DUCT N/A 06/29/2017    Procedure: ENDOSCOPIC RETROGRADE CHOLANGIOPANCREATOGRAPHY (ERCP); W/ REMOVAL OF FOREIGN BODY/STENT FROM BILIARY/PANCREATIC DUCT(S);  Surgeon: Jeffrey Ward Jona Matter, MD;  Location: GI PROCEDURES [facility];  Service: Gastroenterology    PR ERCP STENT PLACEMENT BILIARY/PANCREATIC DUCT N/A 11/29/2016    Procedure: ENDOSCOPIC RETROGRADE CHOLANGIOPANCREATOGRAPHY (ERCP); WITH PLACEMENT OF ENDOSCOPIC STENT INTO BILIARY OR PANCREATIC DUCT;  Surgeon: Olam Earnie Henle, MD;  Location: GI PROCEDURES [facility];  Service: Gastroenterology    PR ERCP STENT PLACEMENT BILIARY/PANCREATIC DUCT N/A 04/26/2017 Procedure: ENDOSCOPIC RETROGRADE CHOLANGIOPANCREATOGRAPHY (ERCP); WITH PLACEMENT OF ENDOSCOPIC STENT INTO BILIARY OR PANCREATIC DUCT;  Surgeon: Chauncey Glendia Pfeiffer, MD;  Location: GI PROCEDURES [facility];  Service: Gastroenterology    PR ERCP,W/REMOVAL STONE,BIL/PANCR DUCTS N/A 11/29/2016    Procedure: ERCP; W/ENDOSCOPIC RETROGRADE REMOVAL OF CALCULUS/CALCULI FROM BILIARY &/OR PANCREATIC DUCTS;  Surgeon: Olam Earnie Henle, MD;  Location: GI PROCEDURES [facility];  Service: Gastroenterology    PR ERCP,W/REMOVAL STONE,BIL/PANCR DUCTS N/A 02/10/2017    Procedure: ERCP; W/ENDOSCOPIC RETROGRADE REMOVAL OF CALCULUS/CALCULI FROM BILIARY &/OR PANCREATIC DUCTS;  Surgeon: Chauncey Glendia Pfeiffer, MD;  Location: GI PROCEDURES [facility];  Service: Gastroenterology    PR ERCP,W/REMOVAL STONE,BIL/PANCR DUCTS N/A 04/26/2017    Procedure: ERCP; W/ENDOSCOPIC RETROGRADE REMOVAL OF CALCULUS/CALCULI FROM BILIARY &/OR PANCREATIC DUCTS;  Surgeon: Chauncey Glendia Pfeiffer, MD;  Location: GI PROCEDURES [facility];  Service: Gastroenterology    PR ERCP,W/REMOVAL STONE,BIL/PANCR DUCTS N/A 06/29/2017    Procedure: ERCP; W/ENDOSCOPIC RETROGRADE REMOVAL OF CALCULUS/CALCULI FROM BILIARY &/OR PANCREATIC DUCTS;  Surgeon: Jeffrey Ward Jona Matter, MD;  Location: GI PROCEDURES [facility];  Service: Gastroenterology    PR ESOPHAGOSCP RIG TRANSORAL HYPOPHARYNX CRV ESOPH N/A 04/04/2018    Procedure: ESOPHAGOSCOPY, RIGID, TRANSORAL WITH DIVERTICULECTOMY OF HYPOPHARYNX OR CERVICAL ESOPHAGUS, WITH CRICOPHARYNGEAL MYOTOMY, INCLUDES USE OF TELESCOPE OR OPERATING MICROSCOPE AND REPAIR, WHEN PERFORMED;  Surgeon: Genell Deno Fairly, MD;  Location: MAIN OR [facility];  Service: ENT    PR INSER HEART TEMP PACER ONE CHMBR N/A 10/02/2016    Procedure: Tempoarary Pacemaker Insertion;  Surgeon: Faisal Fiazuddin Syed, MD;  Location: [facility];  Service: Cardiology    PR LAP,DIAGNOSTIC ABDOMEN N/A 01/22/2016    Procedure: Laparoscopy, Abdomen, Peritoneum, & Omentum, Diagnostic, W/Wo Collection Specimen(S) By Brushing Or Washing;  Surgeon: Jeffrey Dasie Police, MD;  Location: MAIN OR [facility];  Service: Transplant    PR LARYNGOPLASTY MEDIALIZATION WYNONIA N/A 11/07/2018    Procedure: R31 LARYNGOPLASTY, MEDIALIZATION, UNILATERAL;  Surgeon: Genell Deno Fairly, MD;  Location: MAIN OR [facility];  Service: ENT    PR PLACE PERCUT GASTROSTOMY TUBE N/A 11/17/2016    Procedure: UGI ENDO; W/DIRECTED PLCMT PERQ GASTROSTOMY TUBE;  Surgeon: Artist Jayson Leather, MD;  Location: [facility]URES [facility];  Service: Gastroenterology    PR TRACHEOSTOMY, PLANNED N/A 09/29/2016    Procedure: TRACHEOSTOMY PLANNED (SEPART PROC);  Surgeon: Curtistine Mira Dunnings, MD;  Location: MAIN OR [facility];  Service: Trauma    PR TRANSCATH INSERT OR REPLACE LEADLESS PM VENTR N/A 10/11/2016    Procedure: Pacemaker Implant/Replace Leadless;  Surgeon: Faisal Fiazuddin Syed, MD;  Location: [facility];  Service: Cardiology    PR TRANSPLANT LIVER,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    Procedure: Liver Allotransplantation; Orthotopic, Partial Or Whole, From Cadaver Or Living  Donor, Any Age;  Surgeon: Barkley Coni Stallion, MD;  Location: MAIN OR [facility];  Service: Transplant    PR TRANSPLANT,PREP DONOR LIVER, WHOLE N/A 09/15/2016    Procedure: Rudolfo Std Prep Cad Donor Whole Liver Gft Prior Tnsplnt,Inc Chole,Diss/Rem Surr Tissu Wo Triseg/Lobe Splt;  Surgeon: Barkley Coni Stallion, MD;  Location: MAIN OR [facility];  Service: Transplant    PR UPPER GI ENDOSCOPY,BIOPSY N/A 07/17/2012    Procedure: UGI ENDOSCOPY; WITH BIOPSY, SINGLE OR MULTIPLE;  Surgeon: Ozell LELON Ash, MD;  Location: GI PROCEDURES [facility];  Service: Gastroenterology    PR UPPER GI ENDOSCOPY,DIAGNOSIS N/A 02/04/2014    Procedure: UGI ENDO, INCLUDE ESOPHAGUS, STOMACH, & DUODENUM &/OR JEJUNUM; DX W/WO COLLECTION SPECIMN, BY BRUSH OR WASH;  Surgeon: Sandrea CHRISTELLA Mood, MD;  Location: [facility] PROCEDURES [facility];  Service: Gastroenterology    PR UPPER GI ENDOSCOPY,LIGAT VARIX N/A 11/05/2013 Procedure: UGI ENDO; BEULAH LIG ESOPH &/OR GASTRIC VARICES;  Surgeon: Sandrea CHRISTELLA Mood, MD;  Location: GI PROCEDURES [facility];  Service: Gastroenterology       ALLERGIES:    has no known allergies.    MEDICATIONS:  Current Outpatient Medications   Medication Sig Dispense Refill    alendronate  (FOSAMAX ) 70 MG tablet Take 1 tablet every Sunday on empty stomach with a full glass of water. Do not lie down or eat/drink anything else for the next 30 min. 12 tablet 3    blood-glucose meter (GLUCOSE MONITORING KIT) kit Use as instructed 1 each 0    cholecalciferol , vitamin D3, 125 mcg (5,000 unit) tablet Take 1 tablet (125 mcg total) by mouth daily.      cycloSPORINE  modified (NEORAL ) 100 MG capsule Take 2 capsules (200 mg total) by mouth daily AND 1 capsule (100 mg total) nightly. 270 capsule 3    cycloSPORINE  modified (NEORAL ) 25 MG capsule Take 3 capsules (75 mg total) by mouth nightly (with a 100mg  capsule). For a total dose of 200 mg in the morning , 175mg  every evening 90 capsule 11    DULoxetine  (CYMBALTA ) 30 MG capsule Take 2 capsules (60 mg total) by mouth nightly. 60 capsule 11    insulin  glargine (LANTUS  SOLOSTAR U-100 INSULIN ) 100 unit/mL (3 mL) injection pen Inject 0.17 mL (17 Units total) under the skin at bedtime. 15.3 mL 3    insulin  glargine (LANTUS  SOLOSTAR U-100 INSULIN ) 100 unit/mL (3 mL) injection pen Inject 0.14 mL (14 Units total) under the skin at bedtime. 17 mL 2    melatonin 3 mg Tab Take 1 tablet (3 mg total) by mouth nightly. 30 tablet 0    metoprolol succinate (TOPROL-XL) 25 MG 24 hr tablet Take 1 tablet (25 mg total) by mouth.      mirtazapine  (REMERON ) 15 MG tablet Take 0.5 tablets (7.5 mg total) by mouth nightly. 45 tablet 3    mycophenolate  (CELLCEPT ) 250 mg capsule Take 2 capsules (500 mg total) by mouth two (2) times a day. 120 capsule 11    pen needle, diabetic (ULTICARE PEN NEEDLE) 32 gauge x 5/32 (4 mm) Ndle USE WITH INSULIN  PEN 4 TIMES DAILY AS DIRECTED 100 each 1    pen needle, diabetic (ULTICARE PEN NEEDLE) 32 gauge x 5/32 (4 mm) Ndle Use with insulin  twice a day 200 each 1    polyethylene glycol (GOLYTELY ) 236-22.74-6.74 gram solution Take by mouth as directed per [facility] GI prep instructions, for split bowel prep. 4000 mL 0    rosuvastatin  (CRESTOR ) 10 MG tablet Take 1 tablet (10 mg total)  by mouth once daily 90 tablet 3    rosuvastatin  (CRESTOR ) 5 MG tablet Take 1 tablet (5 mg total) by mouth daily. 90 tablet 3    sildenafiL , pulm.hypertension, (REVATIO ) 20 mg tablet Take 2 tablets (40 mg total) by mouth Three (3) times a day. 180 tablet 11     No current facility-administered medications for this visit.        SOCIAL HISTORY:  Social History     Tobacco Use    Smoking status: Never     Passive exposure: Past    Smokeless tobacco: Never    Tobacco comments:     Smoked in high school for about 5 years.    Substance Use Topics    Alcohol use: No    Drug use: No        FAMILY HISTORY:  Unknown for prostate cancer disease.  Family History   Problem Relation Age of Onset    Hypertension Mother     Cancer Mother     Heart disease Mother     Heart disease Father     Cirrhosis Neg Hx     Liver cancer Neg Hx     Anemia Neg Hx     Diabetes Neg Hx     Kidney disease Neg Hx     Obesity Neg Hx     Thyroid disease Neg Hx     Osteoporosis Neg Hx     Coronary artery disease Neg Hx     Anesthesia problems Neg Hx     Basal cell carcinoma Neg Hx     Squamous cell carcinoma Neg Hx        REVIEW OF SYSTEMS:  A 10-system review of systems was completed  by the patient and reviewed by me today.  All pertinent positives  were discussed in history of present illness.    PHYSICAL EXAMINATION:  VITAL SIGNS:  The patient is afebrile.  Vital signs are stable.  GENERAL:  In no apparent distress.  HEENT:  Eyes, ears and mouth appeared normal.  LYMPHATICS:  No cervical lymphadenopathy.  LUNGS:  Chest wall excursion symmetric bilaterally. No audible wheezing     DRE: (From prior visit) prostate mildly enlarged, smooth, soft no nodules  EXTREMITIES:  No lower extremity edema.  SKIN:  No rashes or jaundice.  NEUROLOGIC:  No focal deficits on neurologic exam.    LABORATORY TESTING:      Chemistry        Component Value Date/Time    NA 140 05/22/2023 1244    NA 141 09/26/2016 1640    K 4.2 05/22/2023 1244    K 4.1 09/26/2016 1640    CL 103 05/22/2023 1244    CL 106 09/25/2018 1715    CO2 26.6 05/22/2023 1244    CO2 29 10/10/2013 1417    BUN 26 (H) 05/22/2023 1244    BUN 34 (H) 09/25/2018 1715    CREATININE 1.70 (H) 05/22/2023 1244    CREATININE 1.0 09/25/2015 1042    GLU 256 (H) 05/22/2023 1244        Component Value Date/Time    CALCIUM 9.8 05/22/2023 1244    CALCIUM 9.3 10/10/2013 1417    ALKPHOS 88 05/22/2023 1244    ALKPHOS 214 (H) 01/09/2014 1029    AST 11 05/22/2023 1244    AST 46 01/09/2014 1029    ALT 9 (L) 05/22/2023 1244    ALT 41 01/09/2014 1029    BILITOT 0.4 05/22/2023 1244  BILITOT 0.5 12/12/2017 0830                        06/22/2021 AUA symptom score total of 35 with bother of 6 and a leakage of 4.  06/22/2021 postvoid residual 26 ml    02/15/2022 AUA SS 6 with bother of 1   02/15/2022 PVR 21 ml    02/21/2023 AUA SS 19 with bother of 4   12/10 2024 PVR 33 ml     03/05/2024 AUA SS     03/05/2024 PVR 36ml

## 2024-03-04 NOTE — Telephone Encounter (Signed)
 Called pt to schedule annual appt but first asked if he were aware his insurance will be OON with [facility] as of 03/14/24. He did not know that so I gave him phone number for [employer] and said they can help him find another insurance. Went ahead and scheduled pt's annual for 06/03/24 at [facility] since he is looking into new insurance, and he was comfortable making appt. He said he will get labs week before in [place]. And denied need for appt letter.  Asked him to please call this tpa after he has new insurance, and he verbalized understanding of all discussed.    Pt called back and said he called [employer] and was told he has to wait until after 03/14/24 to get insurance. Told him he has plenty of time to make switch since his appt is in March and to still please call this tpa once he has new insurance.

## 2024-03-05 ENCOUNTER — Ambulatory Visit: Admit: 2024-03-05 | Discharge: 2024-03-06 | Payer: Medicare (Managed Care) | Attending: Urology | Primary: Urology

## 2024-03-05 DIAGNOSIS — N400 Enlarged prostate without lower urinary tract symptoms: Principal | ICD-10-CM

## 2024-03-05 LAB — URINALYSIS WITH MICROSCOPY
BACTERIA: NONE SEEN /HPF
BILIRUBIN UA: NEGATIVE
BLOOD UA: NEGATIVE
GLUCOSE UA: >1000 — AB
KETONES UA: NEGATIVE
LEUKOCYTE ESTERASE UA: NEGATIVE
NITRITE UA: NEGATIVE
PH UA: 5.5 (ref 5.0–9.0)
PROTEIN UA: 30 — AB
RBC UA: 4 /HPF — ABNORMAL HIGH (ref <=3)
SPECIFIC GRAVITY UA: 1.027 (ref 1.003–1.030)
SQUAMOUS EPITHELIAL: <1 /HPF (ref 0–5)
TRANSITIONAL EPITHELIAL: <1 /HPF (ref 0–2)
UROBILINOGEN UA: <2
WBC UA: 4 /HPF — ABNORMAL HIGH (ref <=2)

## 2024-03-05 LAB — PSA: PROSTATE SPECIFIC ANTIGEN: 3.82 ng/mL (ref 0.00–4.00)

## 2024-03-05 NOTE — Patient Instructions (Addendum)
 Reviewed urinary symptoms and postvoid residual in clinic today  Reviewed response to tamsulosin  medication  Reviewed prior PSA result; PSA today  Discussed behavioral changes recommendations including:  1.  Timed voiding every 2-3 hours by the clock and not on sensation alone  2.  Double voiding-trying to urinate again shortly after going to see if you can empty more  3.  Continue good bowel regimen to minimize constipation episodes  4.  Shift fluid intake earlier in the day and less in the evening hours and at night to decrease nighttime voiding  5.  Minimizing high diuretic type fluids such as tea, soda, alcohol, coffee which will increase urination frequency  Reviewed importance of better diabetes and sugar control to help with overactive urinary complaints  Reviewed recent urinalysis testings which did show microscopic blood in the urine; discussed evaluation including imaging of the kidneys and bladder and local cystoscopy procedure  Return to clinic with Jeffrey Ward in 12 months for follow-up

## 2024-03-11 ENCOUNTER — Inpatient Hospital Stay: Admit: 2024-03-11 | Discharge: 2024-03-11 | Payer: Medicare (Managed Care)
